# Patient Record
Sex: Female | Born: 1940
Health system: Southern US, Community
[De-identification: ages and names within clinical notes are randomized; demographics above are authoritative.]

## PROBLEM LIST (undated history)

## (undated) DIAGNOSIS — R0600 Dyspnea, unspecified: Secondary | ICD-10-CM

## (undated) DIAGNOSIS — M797 Fibromyalgia: Secondary | ICD-10-CM

## (undated) DIAGNOSIS — R011 Cardiac murmur, unspecified: Secondary | ICD-10-CM

## (undated) DIAGNOSIS — T4145XA Adverse effect of unspecified anesthetic, initial encounter: Secondary | ICD-10-CM

## (undated) DIAGNOSIS — F32A Depression, unspecified: Secondary | ICD-10-CM

## (undated) DIAGNOSIS — R42 Dizziness and giddiness: Secondary | ICD-10-CM

## (undated) DIAGNOSIS — R911 Solitary pulmonary nodule: Secondary | ICD-10-CM

## (undated) DIAGNOSIS — R042 Hemoptysis: Secondary | ICD-10-CM

## (undated) DIAGNOSIS — D649 Anemia, unspecified: Secondary | ICD-10-CM

## (undated) DIAGNOSIS — E875 Hyperkalemia: Secondary | ICD-10-CM

## (undated) DIAGNOSIS — F419 Anxiety disorder, unspecified: Secondary | ICD-10-CM

## (undated) DIAGNOSIS — T8859XA Other complications of anesthesia, initial encounter: Secondary | ICD-10-CM

## (undated) DIAGNOSIS — Z9289 Personal history of other medical treatment: Secondary | ICD-10-CM

## (undated) DIAGNOSIS — I209 Angina pectoris, unspecified: Secondary | ICD-10-CM

## (undated) DIAGNOSIS — R51 Headache: Secondary | ICD-10-CM

## (undated) DIAGNOSIS — F329 Major depressive disorder, single episode, unspecified: Secondary | ICD-10-CM

## (undated) DIAGNOSIS — E039 Hypothyroidism, unspecified: Secondary | ICD-10-CM

## (undated) DIAGNOSIS — M1990 Unspecified osteoarthritis, unspecified site: Secondary | ICD-10-CM

## (undated) DIAGNOSIS — K1230 Oral mucositis (ulcerative), unspecified: Secondary | ICD-10-CM

## (undated) DIAGNOSIS — K219 Gastro-esophageal reflux disease without esophagitis: Secondary | ICD-10-CM

## (undated) DIAGNOSIS — M25559 Pain in unspecified hip: Secondary | ICD-10-CM

## (undated) DIAGNOSIS — B450 Pulmonary cryptococcosis: Secondary | ICD-10-CM

## (undated) DIAGNOSIS — I959 Hypotension, unspecified: Secondary | ICD-10-CM

## (undated) DIAGNOSIS — E538 Deficiency of other specified B group vitamins: Secondary | ICD-10-CM

## (undated) DIAGNOSIS — C50919 Malignant neoplasm of unspecified site of unspecified female breast: Secondary | ICD-10-CM

## (undated) DIAGNOSIS — G629 Polyneuropathy, unspecified: Secondary | ICD-10-CM

## (undated) DIAGNOSIS — I219 Acute myocardial infarction, unspecified: Secondary | ICD-10-CM

## (undated) DIAGNOSIS — Z9889 Other specified postprocedural states: Secondary | ICD-10-CM

## (undated) DIAGNOSIS — E871 Hypo-osmolality and hyponatremia: Secondary | ICD-10-CM

## (undated) DIAGNOSIS — J449 Chronic obstructive pulmonary disease, unspecified: Secondary | ICD-10-CM

## (undated) DIAGNOSIS — J189 Pneumonia, unspecified organism: Secondary | ICD-10-CM

## (undated) DIAGNOSIS — R112 Nausea with vomiting, unspecified: Secondary | ICD-10-CM

## (undated) HISTORY — DX: Oral mucositis (ulcerative), unspecified: K12.30

## (undated) HISTORY — PX: EYE SURGERY: SHX253

## (undated) HISTORY — DX: Hypo-osmolality and hyponatremia: E87.1

## (undated) HISTORY — DX: Personal history of other medical treatment: Z92.89

## (undated) HISTORY — DX: Unspecified osteoarthritis, unspecified site: M19.90

## (undated) HISTORY — PX: BREAST SURGERY: SHX581

## (undated) HISTORY — DX: Chronic obstructive pulmonary disease, unspecified: J44.9

## (undated) HISTORY — DX: Solitary pulmonary nodule: R91.1

## (undated) HISTORY — DX: Hyperkalemia: E87.5

## (undated) HISTORY — DX: Dizziness and giddiness: R42

## (undated) HISTORY — PX: CATARACT EXTRACTION, BILATERAL: SHX1313

## (undated) HISTORY — DX: Hypothyroidism, unspecified: E03.9

## (undated) HISTORY — DX: Malignant neoplasm of unspecified site of unspecified female breast: C50.919

## (undated) HISTORY — PX: OTHER SURGICAL HISTORY: SHX169

## (undated) HISTORY — PX: COLONOSCOPY: SHX174

## (undated) HISTORY — PX: BREAST RECONSTRUCTION: SHX9

## (undated) HISTORY — DX: Fibromyalgia: M79.7

## (undated) HISTORY — DX: Pulmonary cryptococcosis: B45.0

## (undated) HISTORY — DX: Pain in unspecified hip: M25.559

---

## 1969-01-15 HISTORY — PX: CHOLECYSTECTOMY: SHX55

## 1979-01-16 HISTORY — PX: ABDOMINAL HYSTERECTOMY: SHX81

## 2006-01-15 DIAGNOSIS — R112 Nausea with vomiting, unspecified: Secondary | ICD-10-CM

## 2006-01-15 DIAGNOSIS — Z9889 Other specified postprocedural states: Secondary | ICD-10-CM

## 2006-01-15 HISTORY — PX: MASTECTOMY: SHX3

## 2006-01-15 HISTORY — DX: Other specified postprocedural states: R11.2

## 2006-01-15 HISTORY — DX: Other specified postprocedural states: Z98.890

## 2008-06-23 ENCOUNTER — Ambulatory Visit: Payer: Self-pay | Admitting: Internal Medicine

## 2008-07-05 LAB — CBC WITH DIFFERENTIAL/PLATELET
Basophils Absolute: 0 10*3/uL (ref 0.0–0.1)
Eosinophils Absolute: 0.1 10*3/uL (ref 0.0–0.5)
HCT: 33.5 % — ABNORMAL LOW (ref 34.8–46.6)
HGB: 11.6 g/dL (ref 11.6–15.9)
MCV: 94.7 fL (ref 79.5–101.0)
NEUT#: 3.8 10*3/uL (ref 1.5–6.5)
NEUT%: 57.2 % (ref 38.4–76.8)
RDW: 15.3 % — ABNORMAL HIGH (ref 11.2–14.5)
lymph#: 1.9 10*3/uL (ref 0.9–3.3)

## 2008-07-06 LAB — KAPPA/LAMBDA LIGHT CHAINS

## 2008-07-06 LAB — COMPREHENSIVE METABOLIC PANEL
Albumin: 3.9 g/dL (ref 3.5–5.2)
BUN: 17 mg/dL (ref 6–23)
Calcium: 9.3 mg/dL (ref 8.4–10.5)
Chloride: 103 mEq/L (ref 96–112)
Creatinine, Ser: 1.18 mg/dL (ref 0.40–1.20)
Glucose, Bld: 110 mg/dL — ABNORMAL HIGH (ref 70–99)
Potassium: 4.8 mEq/L (ref 3.5–5.3)

## 2008-07-06 LAB — LACTATE DEHYDROGENASE: LDH: 203 U/L (ref 94–250)

## 2008-07-06 LAB — IGG, IGA, IGM
IgA: 60 mg/dL — ABNORMAL LOW (ref 68–378)
IgG (Immunoglobin G), Serum: 1590 mg/dL (ref 694–1618)

## 2008-07-08 ENCOUNTER — Ambulatory Visit (HOSPITAL_COMMUNITY): Admission: RE | Admit: 2008-07-08 | Discharge: 2008-07-08 | Payer: Self-pay | Admitting: Internal Medicine

## 2008-07-13 ENCOUNTER — Ambulatory Visit (HOSPITAL_COMMUNITY): Admission: RE | Admit: 2008-07-13 | Discharge: 2008-07-13 | Payer: Self-pay | Admitting: Internal Medicine

## 2008-07-13 ENCOUNTER — Encounter (INDEPENDENT_AMBULATORY_CARE_PROVIDER_SITE_OTHER): Payer: Self-pay | Admitting: Interventional Radiology

## 2008-08-20 ENCOUNTER — Ambulatory Visit: Payer: Self-pay | Admitting: Internal Medicine

## 2008-08-23 LAB — COMPREHENSIVE METABOLIC PANEL
Albumin: 3.3 g/dL — ABNORMAL LOW (ref 3.5–5.2)
BUN: 11 mg/dL (ref 6–23)
CO2: 28 mEq/L (ref 19–32)
Calcium: 8 mg/dL — ABNORMAL LOW (ref 8.4–10.5)
Chloride: 103 mEq/L (ref 96–112)
Glucose, Bld: 110 mg/dL — ABNORMAL HIGH (ref 70–99)
Potassium: 3.7 mEq/L (ref 3.5–5.3)
Total Protein: 6.1 g/dL (ref 6.0–8.3)

## 2008-08-23 LAB — LACTATE DEHYDROGENASE: LDH: 144 U/L (ref 94–250)

## 2008-08-23 LAB — CBC WITH DIFFERENTIAL/PLATELET
Basophils Absolute: 0 10*3/uL (ref 0.0–0.1)
Eosinophils Absolute: 0.1 10*3/uL (ref 0.0–0.5)
HGB: 9.4 g/dL — ABNORMAL LOW (ref 11.6–15.9)
MCV: 93.3 fL (ref 79.5–101.0)
MONO#: 0.1 10*3/uL (ref 0.1–0.9)
MONO%: 3.1 % (ref 0.0–14.0)
NEUT#: 3.4 10*3/uL (ref 1.5–6.5)
RDW: 13.9 % (ref 11.2–14.5)
WBC: 3.9 10*3/uL (ref 3.9–10.3)
lymph#: 0.3 10*3/uL — ABNORMAL LOW (ref 0.9–3.3)

## 2008-08-27 LAB — CBC WITH DIFFERENTIAL/PLATELET
HGB: 8.8 g/dL — ABNORMAL LOW (ref 11.6–15.9)
RBC: 2.95 10*6/uL — ABNORMAL LOW (ref 3.70–5.45)
RDW: 12.8 % (ref 11.2–14.5)
WBC: <0.2 10*3/uL — CL (ref 3.9–10.3)
nRBC: 0 % (ref 0–0)

## 2008-09-06 LAB — BASIC METABOLIC PANEL
CO2: 25 mEq/L (ref 19–32)
Calcium: 8.4 mg/dL (ref 8.4–10.5)
Glucose, Bld: 129 mg/dL — ABNORMAL HIGH (ref 70–99)
Potassium: 3.5 mEq/L (ref 3.5–5.3)
Sodium: 140 mEq/L (ref 135–145)

## 2008-09-06 LAB — CBC WITH DIFFERENTIAL/PLATELET
Basophils Absolute: 0 10*3/uL (ref 0.0–0.1)
Eosinophils Absolute: 0 10*3/uL (ref 0.0–0.5)
HCT: 28.5 % — ABNORMAL LOW (ref 34.8–46.6)
HGB: 9.2 g/dL — ABNORMAL LOW (ref 11.6–15.9)
MCV: 91.3 fL (ref 79.5–101.0)
MONO%: 19.2 % — ABNORMAL HIGH (ref 0.0–14.0)
NEUT#: 5.7 10*3/uL (ref 1.5–6.5)
RDW: 13.6 % (ref 11.2–14.5)

## 2008-09-13 LAB — CBC WITH DIFFERENTIAL/PLATELET
Basophils Absolute: 0 10*3/uL (ref 0.0–0.1)
EOS%: 0.1 % (ref 0.0–7.0)
HCT: 28.4 % — ABNORMAL LOW (ref 34.8–46.6)
HGB: 9.5 g/dL — ABNORMAL LOW (ref 11.6–15.9)
LYMPH%: 21.4 % (ref 14.0–49.7)
MCH: 31.3 pg (ref 25.1–34.0)
MCV: 93.1 fL (ref 79.5–101.0)
MONO%: 13.6 % (ref 0.0–14.0)
NEUT%: 64.3 % (ref 38.4–76.8)

## 2008-09-13 LAB — BASIC METABOLIC PANEL
BUN: 7 mg/dL (ref 6–23)
Creatinine, Ser: 1.19 mg/dL (ref 0.40–1.20)

## 2008-09-13 LAB — MAGNESIUM: Magnesium: 2.3 mg/dL (ref 1.5–2.5)

## 2008-10-19 ENCOUNTER — Ambulatory Visit: Payer: Self-pay | Admitting: Internal Medicine

## 2008-10-19 LAB — MANUAL DIFFERENTIAL
ALC: 1.4 10*3/uL (ref 0.9–3.3)
Band Neutrophils: 1 % (ref 0–10)
LYMPH: 29 % (ref 14–49)
MONO: 12 % (ref 0–14)
Myelocytes: 0 % (ref 0–0)
Other Cell: 0 % (ref 0–0)
SEG: 54 % (ref 38–77)
Variant Lymph: 0 % (ref 0–0)
nRBC: 1 % — ABNORMAL HIGH (ref 0–0)

## 2008-10-19 LAB — CBC WITH DIFFERENTIAL/PLATELET
HCT: 32 % — ABNORMAL LOW (ref 34.8–46.6)
HGB: 10.6 g/dL — ABNORMAL LOW (ref 11.6–15.9)
Platelets: 105 10*3/uL — ABNORMAL LOW (ref 145–400)
RBC: 3.56 10*6/uL — ABNORMAL LOW (ref 3.70–5.45)
WBC: 4.9 10*3/uL (ref 3.9–10.3)

## 2008-10-19 LAB — BASIC METABOLIC PANEL
Chloride: 101 mEq/L (ref 96–112)
Creatinine, Ser: 0.75 mg/dL (ref 0.40–1.20)

## 2008-10-21 ENCOUNTER — Inpatient Hospital Stay (HOSPITAL_COMMUNITY): Admission: EM | Admit: 2008-10-21 | Discharge: 2008-10-25 | Payer: Self-pay | Admitting: Emergency Medicine

## 2008-11-16 ENCOUNTER — Encounter: Admission: RE | Admit: 2008-11-16 | Discharge: 2008-11-16 | Payer: Self-pay | Admitting: Otolaryngology

## 2008-12-14 ENCOUNTER — Ambulatory Visit: Payer: Self-pay | Admitting: Internal Medicine

## 2008-12-14 LAB — COMPREHENSIVE METABOLIC PANEL
BUN: 6 mg/dL (ref 6–23)
CO2: 25 mEq/L (ref 19–32)
Calcium: 9.2 mg/dL (ref 8.4–10.5)
Chloride: 101 mEq/L (ref 96–112)
Creatinine, Ser: 0.62 mg/dL (ref 0.40–1.20)
Glucose, Bld: 113 mg/dL — ABNORMAL HIGH (ref 70–99)
Total Bilirubin: 1.3 mg/dL — ABNORMAL HIGH (ref 0.3–1.2)

## 2008-12-14 LAB — CBC WITH DIFFERENTIAL/PLATELET
Basophils Absolute: 0 10*3/uL (ref 0.0–0.1)
HCT: 35.8 % (ref 34.8–46.6)
HGB: 12 g/dL (ref 11.6–15.9)
LYMPH%: 56.4 % — ABNORMAL HIGH (ref 14.0–49.7)
MONO#: 0.6 10*3/uL (ref 0.1–0.9)
NEUT%: 33.8 % — ABNORMAL LOW (ref 38.4–76.8)
Platelets: 149 10*3/uL (ref 145–400)
WBC: 7.2 10*3/uL (ref 3.9–10.3)
lymph#: 4 10*3/uL — ABNORMAL HIGH (ref 0.9–3.3)

## 2008-12-15 ENCOUNTER — Emergency Department (HOSPITAL_COMMUNITY): Admission: EM | Admit: 2008-12-15 | Discharge: 2008-12-15 | Payer: Self-pay | Admitting: Emergency Medicine

## 2008-12-15 LAB — BASIC METABOLIC PANEL
BUN: 6 mg/dL (ref 6–23)
Calcium: 9.1 mg/dL (ref 8.4–10.5)
Potassium: 2.8 mEq/L — ABNORMAL LOW (ref 3.5–5.3)
Sodium: 141 mEq/L (ref 135–145)

## 2008-12-15 LAB — MAGNESIUM: Magnesium: 1.7 mg/dL (ref 1.5–2.5)

## 2008-12-17 LAB — BASIC METABOLIC PANEL
CO2: 21 mEq/L (ref 19–32)
Calcium: 9 mg/dL (ref 8.4–10.5)
Sodium: 141 mEq/L (ref 135–145)

## 2008-12-21 ENCOUNTER — Ambulatory Visit (HOSPITAL_COMMUNITY): Admission: RE | Admit: 2008-12-21 | Discharge: 2008-12-21 | Payer: Self-pay | Admitting: Oncology

## 2008-12-24 LAB — COMPREHENSIVE METABOLIC PANEL
Albumin: 3.5 g/dL (ref 3.5–5.2)
BUN: 3 mg/dL — ABNORMAL LOW (ref 6–23)
CO2: 21 mEq/L (ref 19–32)
Calcium: 9.9 mg/dL (ref 8.4–10.5)
Chloride: 101 mEq/L (ref 96–112)
Creatinine, Ser: 0.9 mg/dL (ref 0.40–1.20)
Glucose, Bld: 102 mg/dL — ABNORMAL HIGH (ref 70–99)
Potassium: 3.7 mEq/L (ref 3.5–5.3)

## 2008-12-24 LAB — CBC WITH DIFFERENTIAL/PLATELET
Basophils Absolute: 0 10*3/uL (ref 0.0–0.1)
EOS%: 1.1 % (ref 0.0–7.0)
Eosinophils Absolute: 0.1 10*3/uL (ref 0.0–0.5)
HCT: 38.9 % (ref 34.8–46.6)
HGB: 13.2 g/dL (ref 11.6–15.9)
MCH: 33.5 pg (ref 25.1–34.0)
MONO#: 0.5 10*3/uL (ref 0.1–0.9)
NEUT#: 2.8 10*3/uL (ref 1.5–6.5)
NEUT%: 38 % — ABNORMAL LOW (ref 38.4–76.8)
lymph#: 4 10*3/uL — ABNORMAL HIGH (ref 0.9–3.3)

## 2008-12-24 LAB — T3 UPTAKE: T3 Uptake: 33.7 % (ref 22.5–37.0)

## 2008-12-24 LAB — TSH: TSH: 0.841 u[IU]/mL (ref 0.350–4.500)

## 2008-12-27 LAB — BASIC METABOLIC PANEL
BUN: 4 mg/dL — ABNORMAL LOW (ref 6–23)
Calcium: 9.4 mg/dL (ref 8.4–10.5)
Creatinine, Ser: 0.92 mg/dL (ref 0.40–1.20)

## 2008-12-31 LAB — BASIC METABOLIC PANEL
Potassium: 3.8 mEq/L (ref 3.5–5.3)
Sodium: 139 mEq/L (ref 135–145)

## 2008-12-31 LAB — MAGNESIUM: Magnesium: 1.5 mg/dL (ref 1.5–2.5)

## 2009-01-11 LAB — BASIC METABOLIC PANEL
CO2: 22 mEq/L (ref 19–32)
Calcium: 8.7 mg/dL (ref 8.4–10.5)
Glucose, Bld: 109 mg/dL — ABNORMAL HIGH (ref 70–99)
Potassium: 4.8 mEq/L (ref 3.5–5.3)
Sodium: 143 mEq/L (ref 135–145)

## 2009-01-12 ENCOUNTER — Ambulatory Visit: Payer: Self-pay | Admitting: Internal Medicine

## 2009-01-17 LAB — CBC WITH DIFFERENTIAL/PLATELET
BASO%: 0.5 % (ref 0.0–2.0)
Basophils Absolute: 0 10*3/uL (ref 0.0–0.1)
EOS%: 3.2 % (ref 0.0–7.0)
Eosinophils Absolute: 0.2 10*3/uL (ref 0.0–0.5)
HCT: 32.6 % — ABNORMAL LOW (ref 34.8–46.6)
HGB: 11.1 g/dL — ABNORMAL LOW (ref 11.6–15.9)
LYMPH%: 47.2 % (ref 14.0–49.7)
MCH: 33.8 pg (ref 25.1–34.0)
MCHC: 33.9 g/dL (ref 31.5–36.0)
MCV: 99.6 fL (ref 79.5–101.0)
MONO#: 0.3 10*3/uL (ref 0.1–0.9)
MONO%: 6.3 % (ref 0.0–14.0)
NEUT#: 2.3 10*3/uL (ref 1.5–6.5)
NEUT%: 42.8 % (ref 38.4–76.8)
Platelets: 212 10*3/uL (ref 145–400)
RBC: 3.28 10*6/uL — ABNORMAL LOW (ref 3.70–5.45)
RDW: 15 % — ABNORMAL HIGH (ref 11.2–14.5)
WBC: 5.4 10*3/uL (ref 3.9–10.3)
lymph#: 2.5 10*3/uL (ref 0.9–3.3)

## 2009-01-17 LAB — COMPREHENSIVE METABOLIC PANEL
ALT: 20 U/L (ref 0–35)
AST: 45 U/L — ABNORMAL HIGH (ref 0–37)
Albumin: 3.5 g/dL (ref 3.5–5.2)
Alkaline Phosphatase: 139 U/L — ABNORMAL HIGH (ref 39–117)
BUN: 14 mg/dL (ref 6–23)
CO2: 25 mEq/L (ref 19–32)
Calcium: 8.4 mg/dL (ref 8.4–10.5)
Chloride: 108 mEq/L (ref 96–112)
Creatinine, Ser: 0.75 mg/dL (ref 0.40–1.20)
Glucose, Bld: 103 mg/dL — ABNORMAL HIGH (ref 70–99)
Potassium: 3.8 mEq/L (ref 3.5–5.3)
Sodium: 143 mEq/L (ref 135–145)
Total Bilirubin: 0.5 mg/dL (ref 0.3–1.2)
Total Protein: 5.4 g/dL — ABNORMAL LOW (ref 6.0–8.3)

## 2009-01-18 LAB — KAPPA/LAMBDA LIGHT CHAINS
Kappa free light chain: 1.3 mg/dL (ref 0.33–1.94)
Kappa:Lambda Ratio: 1.97 — ABNORMAL HIGH (ref 0.26–1.65)
Lambda Free Lght Chn: 0.66 mg/dL (ref 0.57–2.63)

## 2009-01-18 LAB — BETA 2 MICROGLOBULIN, SERUM: Beta-2 Microglobulin: 2.77 mg/L — ABNORMAL HIGH (ref 1.01–1.73)

## 2009-01-18 LAB — IGG, IGA, IGM
IgA: 92 mg/dL (ref 68–378)
IgG (Immunoglobin G), Serum: 882 mg/dL (ref 694–1618)
IgM, Serum: 39 mg/dL — ABNORMAL LOW (ref 60–263)

## 2009-01-26 ENCOUNTER — Ambulatory Visit: Payer: Self-pay | Admitting: Vascular Surgery

## 2009-01-26 ENCOUNTER — Ambulatory Visit
Admission: RE | Admit: 2009-01-26 | Discharge: 2009-01-26 | Payer: Self-pay | Source: Home / Self Care | Admitting: Internal Medicine

## 2009-01-26 ENCOUNTER — Encounter: Payer: Self-pay | Admitting: Internal Medicine

## 2009-01-26 LAB — COMPREHENSIVE METABOLIC PANEL
ALT: 12 U/L (ref 0–35)
AST: 25 U/L (ref 0–37)
Albumin: 3.3 g/dL — ABNORMAL LOW (ref 3.5–5.2)
Alkaline Phosphatase: 111 U/L (ref 39–117)
BUN: 8 mg/dL (ref 6–23)
CO2: 27 mEq/L (ref 19–32)
Calcium: 8.5 mg/dL (ref 8.4–10.5)
Chloride: 106 mEq/L (ref 96–112)
Creatinine, Ser: 0.82 mg/dL (ref 0.40–1.20)
Glucose, Bld: 105 mg/dL — ABNORMAL HIGH (ref 70–99)
Potassium: 3.1 mEq/L — ABNORMAL LOW (ref 3.5–5.3)
Sodium: 140 mEq/L (ref 135–145)
Total Bilirubin: 0.8 mg/dL (ref 0.3–1.2)
Total Protein: 5.7 g/dL — ABNORMAL LOW (ref 6.0–8.3)

## 2009-01-26 LAB — CBC WITH DIFFERENTIAL/PLATELET
BASO%: 0.4 % (ref 0.0–2.0)
Basophils Absolute: 0 10*3/uL (ref 0.0–0.1)
EOS%: 2.8 % (ref 0.0–7.0)
Eosinophils Absolute: 0.2 10*3/uL (ref 0.0–0.5)
HCT: 32 % — ABNORMAL LOW (ref 34.8–46.6)
HGB: 10.6 g/dL — ABNORMAL LOW (ref 11.6–15.9)
LYMPH%: 46.9 % (ref 14.0–49.7)
MCH: 33.3 pg (ref 25.1–34.0)
MCHC: 33.3 g/dL (ref 31.5–36.0)
MCV: 100.1 fL (ref 79.5–101.0)
MONO#: 0.4 10*3/uL (ref 0.1–0.9)
MONO%: 6.8 % (ref 0.0–14.0)
NEUT#: 2.6 10*3/uL (ref 1.5–6.5)
NEUT%: 43.1 % (ref 38.4–76.8)
Platelets: 202 10*3/uL (ref 145–400)
RBC: 3.2 10*6/uL — ABNORMAL LOW (ref 3.70–5.45)
RDW: 15.8 % — ABNORMAL HIGH (ref 11.2–14.5)
WBC: 5.9 10*3/uL (ref 3.9–10.3)
lymph#: 2.8 10*3/uL (ref 0.9–3.3)

## 2009-02-14 ENCOUNTER — Ambulatory Visit: Payer: Self-pay | Admitting: Internal Medicine

## 2009-04-22 ENCOUNTER — Ambulatory Visit: Payer: Self-pay | Admitting: Internal Medicine

## 2009-05-04 ENCOUNTER — Ambulatory Visit (HOSPITAL_COMMUNITY): Admission: RE | Admit: 2009-05-04 | Discharge: 2009-05-04 | Payer: Self-pay | Admitting: General Surgery

## 2009-08-16 ENCOUNTER — Ambulatory Visit (HOSPITAL_BASED_OUTPATIENT_CLINIC_OR_DEPARTMENT_OTHER): Payer: Medicare Other | Admitting: Internal Medicine

## 2009-08-16 LAB — CBC WITH DIFFERENTIAL/PLATELET
BASO%: 0.4 % (ref 0.0–2.0)
Basophils Absolute: 0 10*3/uL (ref 0.0–0.1)
EOS%: 2 % (ref 0.0–7.0)
Eosinophils Absolute: 0.1 10*3/uL (ref 0.0–0.5)
HCT: 36.6 % (ref 34.8–46.6)
HGB: 12.6 g/dL (ref 11.6–15.9)
LYMPH%: 44.5 % (ref 14.0–49.7)
MCH: 33.1 pg (ref 25.1–34.0)
MCHC: 34.3 g/dL (ref 31.5–36.0)
MCV: 96.5 fL (ref 79.5–101.0)
MONO#: 0.6 10*3/uL (ref 0.1–0.9)
MONO%: 8.8 % (ref 0.0–14.0)
NEUT#: 2.8 10*3/uL (ref 1.5–6.5)
NEUT%: 44.3 % (ref 38.4–76.8)
Platelets: 289 10*3/uL (ref 145–400)
RBC: 3.79 10*6/uL (ref 3.70–5.45)
RDW: 13.7 % (ref 11.2–14.5)
WBC: 6.4 10*3/uL (ref 3.9–10.3)
lymph#: 2.9 10*3/uL (ref 0.9–3.3)

## 2009-08-17 LAB — COMPREHENSIVE METABOLIC PANEL
ALT: 19 U/L (ref 0–35)
AST: 19 U/L (ref 0–37)
Albumin: 4.4 g/dL (ref 3.5–5.2)
Alkaline Phosphatase: 140 U/L — ABNORMAL HIGH (ref 39–117)
BUN: 22 mg/dL (ref 6–23)
CO2: 26 mEq/L (ref 19–32)
Calcium: 9.7 mg/dL (ref 8.4–10.5)
Chloride: 103 mEq/L (ref 96–112)
Creatinine, Ser: 1.3 mg/dL — ABNORMAL HIGH (ref 0.40–1.20)
Glucose, Bld: 102 mg/dL — ABNORMAL HIGH (ref 70–99)
Potassium: 4.4 mEq/L (ref 3.5–5.3)
Sodium: 140 mEq/L (ref 135–145)
Total Bilirubin: 0.4 mg/dL (ref 0.3–1.2)
Total Protein: 6.3 g/dL (ref 6.0–8.3)

## 2009-08-17 LAB — IGG, IGA, IGM
IgA: 46 mg/dL — ABNORMAL LOW (ref 68–378)
IgG (Immunoglobin G), Serum: 769 mg/dL (ref 694–1618)
IgM, Serum: 31 mg/dL — ABNORMAL LOW (ref 60–263)

## 2009-08-17 LAB — KAPPA/LAMBDA LIGHT CHAINS
Kappa free light chain: 0.79 mg/dL (ref 0.33–1.94)
Kappa:Lambda Ratio: 1.61 (ref 0.26–1.65)
Lambda Free Lght Chn: 0.49 mg/dL — ABNORMAL LOW (ref 0.57–2.63)

## 2009-08-17 LAB — BETA 2 MICROGLOBULIN, SERUM: Beta-2 Microglobulin: 3.26 mg/L — ABNORMAL HIGH (ref 1.01–1.73)

## 2009-08-17 LAB — LACTATE DEHYDROGENASE: LDH: 128 U/L (ref 94–250)

## 2009-08-22 ENCOUNTER — Ambulatory Visit (HOSPITAL_COMMUNITY): Admission: RE | Admit: 2009-08-22 | Discharge: 2009-08-22 | Payer: Self-pay | Admitting: Internal Medicine

## 2009-08-23 ENCOUNTER — Encounter: Admission: RE | Admit: 2009-08-23 | Discharge: 2009-08-23 | Payer: Self-pay | Admitting: Orthopedic Surgery

## 2009-10-16 ENCOUNTER — Emergency Department (HOSPITAL_COMMUNITY): Admission: EM | Admit: 2009-10-16 | Discharge: 2009-10-17 | Payer: Self-pay | Admitting: Emergency Medicine

## 2009-10-29 ENCOUNTER — Encounter: Admission: RE | Admit: 2009-10-29 | Discharge: 2009-10-29 | Payer: Self-pay | Admitting: Orthopedic Surgery

## 2009-11-15 ENCOUNTER — Encounter
Admission: RE | Admit: 2009-11-15 | Discharge: 2010-01-04 | Payer: Self-pay | Source: Home / Self Care | Attending: Orthopedic Surgery | Admitting: Orthopedic Surgery

## 2009-11-25 ENCOUNTER — Encounter: Admission: RE | Admit: 2009-11-25 | Discharge: 2009-11-25 | Payer: Self-pay | Admitting: Orthopedic Surgery

## 2010-02-16 ENCOUNTER — Ambulatory Visit: Payer: No Typology Code available for payment source | Admitting: Internal Medicine

## 2010-02-16 DIAGNOSIS — C9000 Multiple myeloma not having achieved remission: Secondary | ICD-10-CM

## 2010-02-16 LAB — COMPREHENSIVE METABOLIC PANEL
ALT: 18 U/L (ref 0–35)
AST: 15 U/L (ref 0–37)
Albumin: 3.6 g/dL (ref 3.5–5.2)
Alkaline Phosphatase: 121 U/L — ABNORMAL HIGH (ref 39–117)
BUN: 21 mg/dL (ref 6–23)
CO2: 32 mEq/L (ref 19–32)
Calcium: 9.6 mg/dL (ref 8.4–10.5)
Chloride: 102 mEq/L (ref 96–112)
Creatinine, Ser: 1.12 mg/dL (ref 0.40–1.20)
Glucose, Bld: 95 mg/dL (ref 70–99)
Potassium: 3.8 mEq/L (ref 3.5–5.3)
Sodium: 141 mEq/L (ref 135–145)
Total Bilirubin: 0.5 mg/dL (ref 0.3–1.2)
Total Protein: 6.7 g/dL (ref 6.0–8.3)

## 2010-02-16 LAB — CBC WITH DIFFERENTIAL/PLATELET
BASO%: 0.2 % (ref 0.0–2.0)
Basophils Absolute: 0 10*3/uL (ref 0.0–0.1)
EOS%: 0.4 % (ref 0.0–7.0)
Eosinophils Absolute: 0 10*3/uL (ref 0.0–0.5)
HCT: 35.2 % (ref 34.8–46.6)
HGB: 11.9 g/dL (ref 11.6–15.9)
LYMPH%: 26.1 % (ref 14.0–49.7)
MCH: 31.4 pg (ref 25.1–34.0)
MCHC: 33.9 g/dL (ref 31.5–36.0)
MCV: 92.7 fL (ref 79.5–101.0)
MONO#: 0.6 10*3/uL (ref 0.1–0.9)
MONO%: 7.3 % (ref 0.0–14.0)
NEUT#: 5.8 10*3/uL (ref 1.5–6.5)
NEUT%: 66 % (ref 38.4–76.8)
Platelets: 331 10*3/uL (ref 145–400)
RBC: 3.8 10*6/uL (ref 3.70–5.45)
RDW: 15.2 % — ABNORMAL HIGH (ref 11.2–14.5)
WBC: 8.8 10*3/uL (ref 3.9–10.3)
lymph#: 2.3 10*3/uL (ref 0.9–3.3)

## 2010-02-16 LAB — LACTATE DEHYDROGENASE: LDH: 102 U/L (ref 94–250)

## 2010-02-17 LAB — KAPPA/LAMBDA LIGHT CHAINS
Kappa free light chain: 1.49 mg/dL (ref 0.33–1.94)
Kappa:Lambda Ratio: 1.75 — ABNORMAL HIGH (ref 0.26–1.65)
Lambda Free Lght Chn: 0.85 mg/dL (ref 0.57–2.63)

## 2010-02-17 LAB — IGG, IGA, IGM
IgA: 69 mg/dL (ref 68–378)
IgG (Immunoglobin G), Serum: 960 mg/dL (ref 694–1618)
IgM, Serum: 50 mg/dL — ABNORMAL LOW (ref 60–263)

## 2010-02-17 LAB — BETA 2 MICROGLOBULIN, SERUM: Beta-2 Microglobulin: 2.73 mg/L — ABNORMAL HIGH (ref 1.01–1.73)

## 2010-02-20 ENCOUNTER — Other Ambulatory Visit: Payer: Self-pay | Admitting: Internal Medicine

## 2010-02-22 ENCOUNTER — Encounter (HOSPITAL_BASED_OUTPATIENT_CLINIC_OR_DEPARTMENT_OTHER): Payer: Medicare Other | Admitting: Internal Medicine

## 2010-02-22 DIAGNOSIS — Z853 Personal history of malignant neoplasm of breast: Secondary | ICD-10-CM

## 2010-02-22 DIAGNOSIS — C9000 Multiple myeloma not having achieved remission: Secondary | ICD-10-CM

## 2010-02-22 DIAGNOSIS — Z9484 Stem cells transplant status: Secondary | ICD-10-CM

## 2010-02-22 LAB — UIFE/LIGHT CHAINS/TP QN, 24-HR UR
Alpha 2, Urine: DETECTED — AB
Beta, Urine: DETECTED — AB
Free Kappa Lt Chains,Ur: 3.7 mg/dL — ABNORMAL HIGH (ref 0.04–1.51)
Free Lambda Excretion/Day: 2.4 mg/d
Free Lambda Lt Chains,Ur: 0.16 mg/dL (ref 0.08–1.01)
Free Lt Chn Excr Rate: 55.5 mg/d
Total Protein, Urine-Ur/day: 66 mg/d (ref 10–140)
Total Protein, Urine: 4.4 mg/dL
Volume, Urine: 1500 mL

## 2010-03-07 ENCOUNTER — Other Ambulatory Visit: Payer: Self-pay | Admitting: Orthopedic Surgery

## 2010-03-07 DIAGNOSIS — M545 Low back pain: Secondary | ICD-10-CM

## 2010-03-10 ENCOUNTER — Ambulatory Visit
Admission: RE | Admit: 2010-03-10 | Discharge: 2010-03-10 | Disposition: A | Discharge status: Home / Self Care | Payer: Medicare Other | Source: Ambulatory Visit | Attending: Orthopedic Surgery | Admitting: Orthopedic Surgery

## 2010-03-10 DIAGNOSIS — M545 Low back pain: Secondary | ICD-10-CM

## 2010-03-22 ENCOUNTER — Encounter: Payer: Self-pay | Admitting: Cardiovascular Disease

## 2010-03-30 LAB — CBC
HCT: 39.2 % (ref 36.0–46.0)
Hemoglobin: 13.6 g/dL (ref 12.0–15.0)
MCH: 33 pg (ref 26.0–34.0)
MCHC: 34.8 g/dL (ref 30.0–36.0)
MCV: 95 fL (ref 78.0–100.0)
Platelets: 258 10*3/uL (ref 150–400)
RBC: 4.13 MIL/uL (ref 3.87–5.11)
RDW: 13.6 % (ref 11.5–15.5)
WBC: 8.8 10*3/uL (ref 4.0–10.5)

## 2010-03-30 LAB — COMPREHENSIVE METABOLIC PANEL
ALT: 18 U/L (ref 0–35)
AST: 18 U/L (ref 0–37)
Albumin: 4 g/dL (ref 3.5–5.2)
Alkaline Phosphatase: 119 U/L — ABNORMAL HIGH (ref 39–117)
Calcium: 9.2 mg/dL (ref 8.4–10.5)
GFR calc Af Amer: 56 mL/min — ABNORMAL LOW (ref >60)
Glucose, Bld: 110 mg/dL — ABNORMAL HIGH (ref 70–99)
Potassium: 3.4 mEq/L — ABNORMAL LOW (ref 3.5–5.1)
Sodium: 136 mEq/L (ref 135–145)
Total Protein: 6.5 g/dL (ref 6.0–8.3)

## 2010-03-30 LAB — DIFFERENTIAL
Basophils Absolute: 0 10*3/uL (ref 0.0–0.1)
Basophils Relative: 0 % (ref 0–1)
Eosinophils Absolute: 0.1 10*3/uL (ref 0.0–0.7)
Eosinophils Relative: 1 % (ref 0–5)
Lymphocytes Relative: 27 % (ref 12–46)
Lymphs Abs: 2.3 10*3/uL (ref 0.7–4.0)
Monocytes Absolute: 0.3 10*3/uL (ref 0.1–1.0)
Monocytes Relative: 4 % (ref 3–12)
Neutro Abs: 6 10*3/uL (ref 1.7–7.7)
Neutrophils Relative %: 69 % (ref 43–77)

## 2010-03-30 LAB — POCT CARDIAC MARKERS
CKMB, poc: <1 ng/mL — ABNORMAL LOW (ref 1.0–8.0)
Troponin i, poc: <0.05 ng/mL (ref 0.00–0.09)

## 2010-03-30 LAB — CLOSTRIDIUM DIFFICILE EIA: C difficile Toxins A+B, EIA: NEGATIVE

## 2010-04-04 LAB — CBC
HCT: 35.2 % — ABNORMAL LOW (ref 36.0–46.0)
MCHC: 34.9 g/dL (ref 30.0–36.0)
MCV: 97.5 fL (ref 78.0–100.0)
Platelets: 228 10*3/uL (ref 150–400)

## 2010-04-18 LAB — COMPREHENSIVE METABOLIC PANEL
ALT: 17 U/L (ref 0–35)
AST: 33 U/L (ref 0–37)
CO2: 26 mEq/L (ref 19–32)
Calcium: 9.2 mg/dL (ref 8.4–10.5)
Chloride: 101 mEq/L (ref 96–112)
GFR calc Af Amer: >60 mL/min (ref >60)
GFR calc non Af Amer: >60 mL/min (ref >60)
Glucose, Bld: 98 mg/dL (ref 70–99)
Sodium: 139 mEq/L (ref 135–145)
Total Bilirubin: 1.9 mg/dL — ABNORMAL HIGH (ref 0.3–1.2)

## 2010-04-18 LAB — URINALYSIS, ROUTINE W REFLEX MICROSCOPIC
Ketones, ur: >80 mg/dL — AB
Nitrite: NEGATIVE
Protein, ur: 30 mg/dL — AB

## 2010-04-18 LAB — URINE MICROSCOPIC-ADD ON

## 2010-04-18 LAB — DIFFERENTIAL
Basophils Absolute: 0.1 10*3/uL (ref 0.0–0.1)
Basophils Relative: 2 % — ABNORMAL HIGH (ref 0–1)
Eosinophils Absolute: 0 10*3/uL (ref 0.0–0.7)
Eosinophils Relative: 1 % (ref 0–5)
Neutrophils Relative %: 49 % (ref 43–77)

## 2010-04-18 LAB — CBC
Hemoglobin: 12.1 g/dL (ref 12.0–15.0)
MCHC: 33.2 g/dL (ref 30.0–36.0)
MCV: 98.9 fL (ref 78.0–100.0)
RBC: 3.68 MIL/uL — ABNORMAL LOW (ref 3.87–5.11)
WBC: 7.3 10*3/uL (ref 4.0–10.5)

## 2010-04-18 LAB — URINE CULTURE

## 2010-04-20 LAB — COMPREHENSIVE METABOLIC PANEL
Alkaline Phosphatase: 90 U/L (ref 39–117)
BUN: 6 mg/dL (ref 6–23)
CO2: 25 mEq/L (ref 19–32)
Chloride: 108 mEq/L (ref 96–112)
GFR calc non Af Amer: 57 mL/min — ABNORMAL LOW (ref >60)
Glucose, Bld: 91 mg/dL (ref 70–99)
Potassium: 3.8 mEq/L (ref 3.5–5.1)
Total Bilirubin: 0.6 mg/dL (ref 0.3–1.2)

## 2010-04-20 LAB — CBC
HCT: 28.3 % — ABNORMAL LOW (ref 36.0–46.0)
HCT: 30.8 % — ABNORMAL LOW (ref 36.0–46.0)
HCT: 32 % — ABNORMAL LOW (ref 36.0–46.0)
Hemoglobin: 10.4 g/dL — ABNORMAL LOW (ref 12.0–15.0)
Hemoglobin: 10.8 g/dL — ABNORMAL LOW (ref 12.0–15.0)
Hemoglobin: 9.2 g/dL — ABNORMAL LOW (ref 12.0–15.0)
Hemoglobin: 9.4 g/dL — ABNORMAL LOW (ref 12.0–15.0)
Hemoglobin: 9.7 g/dL — ABNORMAL LOW (ref 12.0–15.0)
MCHC: 33.9 g/dL (ref 30.0–36.0)
MCV: 92.9 fL (ref 78.0–100.0)
RBC: 2.98 MIL/uL — ABNORMAL LOW (ref 3.87–5.11)
RBC: 3 MIL/uL — ABNORMAL LOW (ref 3.87–5.11)
RBC: 3.26 MIL/uL — ABNORMAL LOW (ref 3.87–5.11)
RDW: 17.3 % — ABNORMAL HIGH (ref 11.5–15.5)
RDW: 17.9 % — ABNORMAL HIGH (ref 11.5–15.5)
RDW: 18.9 % — ABNORMAL HIGH (ref 11.5–15.5)
WBC: 6.4 10*3/uL (ref 4.0–10.5)
WBC: 6.8 10*3/uL (ref 4.0–10.5)

## 2010-04-20 LAB — BASIC METABOLIC PANEL
BUN: 10 mg/dL (ref 6–23)
CO2: 21 mEq/L (ref 19–32)
CO2: 23 mEq/L (ref 19–32)
CO2: 25 mEq/L (ref 19–32)
Calcium: 7.9 mg/dL — ABNORMAL LOW (ref 8.4–10.5)
Calcium: 8 mg/dL — ABNORMAL LOW (ref 8.4–10.5)
Chloride: 99 mEq/L (ref 96–112)
Creatinine, Ser: 0.83 mg/dL (ref 0.4–1.2)
GFR calc Af Amer: >60 mL/min (ref >60)
GFR calc Af Amer: >60 mL/min (ref >60)
GFR calc Af Amer: >60 mL/min (ref >60)
GFR calc non Af Amer: 57 mL/min — ABNORMAL LOW (ref >60)
GFR calc non Af Amer: >60 mL/min (ref >60)
GFR calc non Af Amer: >60 mL/min (ref >60)
GFR calc non Af Amer: >60 mL/min (ref >60)
Glucose, Bld: 94 mg/dL (ref 70–99)
Glucose, Bld: 95 mg/dL (ref 70–99)
Potassium: 2.8 mEq/L — ABNORMAL LOW (ref 3.5–5.1)
Potassium: 3.1 mEq/L — ABNORMAL LOW (ref 3.5–5.1)
Sodium: 132 mEq/L — ABNORMAL LOW (ref 135–145)
Sodium: 136 mEq/L (ref 135–145)
Sodium: 136 mEq/L (ref 135–145)
Sodium: 139 mEq/L (ref 135–145)

## 2010-04-20 LAB — DIFFERENTIAL
Basophils Absolute: 0 10*3/uL (ref 0.0–0.1)
Basophils Relative: 0 % (ref 0–1)
Basophils Relative: 1 % (ref 0–1)
Eosinophils Absolute: 0 10*3/uL (ref 0.0–0.7)
Lymphocytes Relative: 59 % — ABNORMAL HIGH (ref 12–46)
Lymphs Abs: 4.1 10*3/uL — ABNORMAL HIGH (ref 0.7–4.0)
Monocytes Absolute: 0.9 10*3/uL (ref 0.1–1.0)
Neutro Abs: 1.8 10*3/uL (ref 1.7–7.7)
Neutro Abs: 1.8 10*3/uL (ref 1.7–7.7)
Neutrophils Relative %: 28 % — ABNORMAL LOW (ref 43–77)

## 2010-04-20 LAB — URINALYSIS, ROUTINE W REFLEX MICROSCOPIC
Bilirubin Urine: NEGATIVE
Glucose, UA: NEGATIVE mg/dL
Leukocytes, UA: NEGATIVE
Nitrite: NEGATIVE
Specific Gravity, Urine: 1.019 (ref 1.005–1.030)
pH: 5.5 (ref 5.0–8.0)

## 2010-04-20 LAB — URINE MICROSCOPIC-ADD ON

## 2010-04-20 LAB — CLOSTRIDIUM DIFFICILE EIA
C difficile Toxins A+B, EIA: NEGATIVE
C difficile Toxins A+B, EIA: NEGATIVE

## 2010-04-20 LAB — PROTIME-INR: INR: 1.17 (ref 0.00–1.49)

## 2010-04-24 LAB — PROTIME-INR
INR: 0.9 (ref 0.00–1.49)
Prothrombin Time: 12.4 seconds (ref 11.6–15.2)

## 2010-04-24 LAB — CBC
HCT: 36.1 % (ref 36.0–46.0)
Platelets: 340 10*3/uL (ref 150–400)
RDW: 14.5 % (ref 11.5–15.5)
WBC: 7.8 10*3/uL (ref 4.0–10.5)

## 2010-04-24 LAB — BONE MARROW EXAM

## 2010-04-24 LAB — APTT: aPTT: 26 seconds (ref 24–37)

## 2010-05-24 ENCOUNTER — Encounter: Payer: Self-pay | Admitting: *Deleted

## 2010-05-24 ENCOUNTER — Encounter: Payer: Self-pay | Admitting: Cardiovascular Disease

## 2010-05-25 ENCOUNTER — Encounter: Payer: Self-pay | Admitting: Cardiovascular Disease

## 2010-05-25 ENCOUNTER — Encounter: Payer: Self-pay | Admitting: *Deleted

## 2010-05-25 ENCOUNTER — Ambulatory Visit (INDEPENDENT_AMBULATORY_CARE_PROVIDER_SITE_OTHER): Payer: Medicare Other | Admitting: Cardiovascular Disease

## 2010-05-25 VITALS — BP 110/72 | HR 83 | Ht 65.5 in | Wt 180.4 lb

## 2010-05-25 DIAGNOSIS — R9431 Abnormal electrocardiogram [ECG] [EKG]: Secondary | ICD-10-CM

## 2010-05-25 DIAGNOSIS — Z01818 Encounter for other preprocedural examination: Secondary | ICD-10-CM

## 2010-05-25 MED ORDER — CEFACLOR 375 MG PO CHEW: 375.0000 mg | CHEWABLE_TABLET | Freq: Two times a day (BID) | ORAL | Status: DC

## 2010-05-25 NOTE — Assessment & Plan Note (Signed)
Low risk Ok for surgery if echo normal

## 2010-05-25 NOTE — Progress Notes (Signed)
70 yo no previous cardiac history.  Needs clearance for left TKR with Dr Baron Hamper.  ECG mildly abnormal.  Nonspecific ST/T wave changes LAE and poor R wave progression.  No history of CAD, SSCP, palpitations, edema or syncope.  Has had a lot of chemo for breast CA and stem cell transplant for myeloma.  Activity limited by knee pain.  No signs of volume overload or CHF.  No previous surgical complications.  No bleeding diathesis when not on chemo or active myeloma.  Previous smoker quit 4 years ago with mild athmatic disease eased by inhalers.  Given history of chemo and ECG will do echo to make sure EF normal but otherwise should be clear for surgery and low risk  ROS: Denies fever, malais, weight loss, blurry vision, decreased visual acuity, cough, sputum, SOB, hemoptysis, pleuritic pain, palpitaitons, heartburn, abdominal pain, melena, lower extremity edema, claudication, or rash.   General: Affect appropriate Healthy:  appears stated age 89: normal Neck supple with no adenopathy JVP normal no bruits no thyromegaly Lungs clear with no wheezing and good diaphragmatic motion Heart:  S1/S2 no murmur,rub, gallop or click PMI normal Abdomen: benighn, BS positve, no tenderness, no AAA no bruit.  No HSM or HJR Distal pulses intact with no bruits No edema Neuro non-focal Skin warm and dry No muscular weakness  Medications Current Outpatient Prescriptions  Medication Sig Dispense Refill  . amitriptyline (ELAVIL) 10 MG tablet Take 10 mg by mouth 3 (three) times daily.        . butalbital-acetaminophen-caffeine (FIORICET WITH CODEINE) 50-325-40-30 MG per capsule Take 1 capsule by mouth as needed.        . citalopram (CELEXA) 40 MG tablet Take 40 mg by mouth daily.        . Fluticasone-Salmeterol (ADVAIR DISKUS) 100-50 MCG/DOSE AEPB Inhale 1 puff into the lungs every 12 (twelve) hours.        Marland Kitchen levothyroxine (SYNTHROID, LEVOTHROID) 150 MCG tablet Take 150 mcg by mouth daily.        . Loratadine  (CLARITIN) 10 MG CAPS 1 tab po qd       . naproxen sodium (ANAPROX) 220 MG tablet Take 220 mg by mouth as needed.        Marland Kitchen omeprazole (PRILOSEC) 40 MG capsule Take 40 mg by mouth daily.        . traMADol (ULTRAM) 50 MG tablet Take 50 mg by mouth every 6 (six) hours as needed.        Marland Kitchen DISCONTD: cefaclor (RANICLOR) 375 MG chewable tablet Chew 1 tablet (375 mg total) by mouth 2 (two) times daily.  14 tablet  0  . DISCONTD: folic acid (FOLVITE) 1 MG tablet Take 1 mg by mouth daily.        Marland Kitchen DISCONTD: guaiFENesin (MUCINEX) 600 MG 12 hr tablet Take 1,200 mg by mouth 2 (two) times daily.        Marland Kitchen DISCONTD: levothyroxine (SYNTHROID, LEVOTHROID) 112 MCG tablet Take 112 mcg by mouth daily.        Marland Kitchen DISCONTD: Multiple Vitamin (MULTIVITAMIN) capsule Take 1 capsule by mouth daily.        Marland Kitchen DISCONTD: potassium chloride SA (K-DUR,KLOR-CON) 20 MEQ tablet Take 20 mEq by mouth daily.        Marland Kitchen DISCONTD: prochlorperazine (COMPAZINE) 5 MG tablet Take 5 mg by mouth every 6 (six) hours as needed.        Marland Kitchen DISCONTD: valACYclovir (VALTREX) 500 MG tablet Take 500 mg by mouth 2 (  two) times daily.          Allergies Codeine; Iodinated diagnostic agents; Latex; and Sulfa antibiotics  Family History: No family history on file.  Social History: History   Social History  . Marital Status: Single    Spouse Name: N/A    Number of Children: N/A  . Years of Education: N/A   Occupational History  . Not on file.   Social History Main Topics  . Smoking status: Former Smoker -- 1.0 packs/day for 30 years    Types: Cigarettes    Quit date: 02/15/2006  . Smokeless tobacco: Never Used  . Alcohol Use: No  . Drug Use: No  . Sexually Active: Not on file   Other Topics Concern  . Not on file   Social History Narrative  . No narrative on file    Electrocardiogram:  NSR LAE nonspecific ST/T wave changes Poor R wave prgression  Assessment and Plan

## 2010-05-25 NOTE — Assessment & Plan Note (Signed)
Nonspecfic findings.  Check echo

## 2010-05-25 NOTE — Patient Instructions (Addendum)
Your physician has requested that you have an echocardiogram. Echocardiography is a painless test that uses sound waves to create images of your heart. It provides your doctor with information about the size and shape of your heart and how well your heart's chambers and valves are working. This procedure takes approximately one hour. There are no restrictions for this procedure.   

## 2010-06-02 ENCOUNTER — Ambulatory Visit (HOSPITAL_COMMUNITY): Payer: Medicare Other | Attending: Cardiovascular Disease | Admitting: Radiology

## 2010-06-02 DIAGNOSIS — Z01810 Encounter for preprocedural cardiovascular examination: Secondary | ICD-10-CM | POA: Insufficient documentation

## 2010-06-02 DIAGNOSIS — I059 Rheumatic mitral valve disease, unspecified: Secondary | ICD-10-CM | POA: Insufficient documentation

## 2010-06-02 DIAGNOSIS — R9431 Abnormal electrocardiogram [ECG] [EKG]: Secondary | ICD-10-CM | POA: Insufficient documentation

## 2010-07-05 ENCOUNTER — Telehealth: Payer: Self-pay | Admitting: *Deleted

## 2010-07-05 NOTE — Telephone Encounter (Signed)
PT AWARE OF ECHO RESULTS./CY 

## 2010-07-19 ENCOUNTER — Emergency Department (HOSPITAL_COMMUNITY): Payer: Medicare Other

## 2010-07-19 ENCOUNTER — Inpatient Hospital Stay (HOSPITAL_COMMUNITY)
Admission: EM | Admit: 2010-07-19 | Discharge: 2010-07-21 | DRG: 392 | Disposition: A | Discharge status: Home / Self Care | Payer: Medicare Other | Attending: Internal Medicine | Admitting: Internal Medicine

## 2010-07-19 DIAGNOSIS — E860 Dehydration: Secondary | ICD-10-CM | POA: Diagnosis present

## 2010-07-19 DIAGNOSIS — Z9481 Bone marrow transplant status: Secondary | ICD-10-CM

## 2010-07-19 DIAGNOSIS — F341 Dysthymic disorder: Secondary | ICD-10-CM | POA: Diagnosis present

## 2010-07-19 DIAGNOSIS — E039 Hypothyroidism, unspecified: Secondary | ICD-10-CM | POA: Diagnosis present

## 2010-07-19 DIAGNOSIS — R1013 Epigastric pain: Secondary | ICD-10-CM | POA: Diagnosis present

## 2010-07-19 DIAGNOSIS — C9000 Multiple myeloma not having achieved remission: Secondary | ICD-10-CM | POA: Diagnosis present

## 2010-07-19 DIAGNOSIS — K449 Diaphragmatic hernia without obstruction or gangrene: Secondary | ICD-10-CM | POA: Diagnosis present

## 2010-07-19 DIAGNOSIS — F410 Panic disorder [episodic paroxysmal anxiety] without agoraphobia: Secondary | ICD-10-CM | POA: Diagnosis present

## 2010-07-19 DIAGNOSIS — I959 Hypotension, unspecified: Secondary | ICD-10-CM | POA: Diagnosis present

## 2010-07-19 DIAGNOSIS — D72829 Elevated white blood cell count, unspecified: Secondary | ICD-10-CM | POA: Diagnosis present

## 2010-07-19 DIAGNOSIS — K2970 Gastritis, unspecified, without bleeding: Principal | ICD-10-CM | POA: Diagnosis present

## 2010-07-19 DIAGNOSIS — Z853 Personal history of malignant neoplasm of breast: Secondary | ICD-10-CM

## 2010-07-19 DIAGNOSIS — IMO0001 Reserved for inherently not codable concepts without codable children: Secondary | ICD-10-CM | POA: Diagnosis present

## 2010-07-19 DIAGNOSIS — R112 Nausea with vomiting, unspecified: Secondary | ICD-10-CM | POA: Diagnosis present

## 2010-07-19 LAB — LACTIC ACID, PLASMA: Lactic Acid, Venous: 1.7 mmol/L (ref 0.5–2.2)

## 2010-07-19 LAB — DIFFERENTIAL
Basophils Absolute: 0 10*3/uL (ref 0.0–0.1)
Basophils Relative: 0 % (ref 0–1)
Eosinophils Absolute: 0 10*3/uL (ref 0.0–0.7)
Monocytes Absolute: 1 10*3/uL (ref 0.1–1.0)
Neutro Abs: 12.7 10*3/uL — ABNORMAL HIGH (ref 1.7–7.7)
Neutrophils Relative %: 83 % — ABNORMAL HIGH (ref 43–77)

## 2010-07-19 LAB — URINALYSIS, ROUTINE W REFLEX MICROSCOPIC
Glucose, UA: 100 mg/dL — AB
Hgb urine dipstick: NEGATIVE
Ketones, ur: 15 mg/dL — AB
Protein, ur: 30 mg/dL — AB
Urobilinogen, UA: 1 mg/dL (ref 0.0–1.0)

## 2010-07-19 LAB — COMPREHENSIVE METABOLIC PANEL
ALT: 15 U/L (ref 0–35)
AST: 20 U/L (ref 0–37)
Albumin: 3.6 g/dL (ref 3.5–5.2)
Calcium: 10.2 mg/dL (ref 8.4–10.5)
Creatinine, Ser: 1.56 mg/dL — ABNORMAL HIGH (ref 0.50–1.10)
GFR calc non Af Amer: 33 mL/min — ABNORMAL LOW (ref >60)
Sodium: 135 mEq/L (ref 135–145)
Total Protein: 7.5 g/dL (ref 6.0–8.3)

## 2010-07-19 LAB — CK TOTAL AND CKMB (NOT AT ARMC)
CK, MB: 0.9 ng/mL (ref 0.3–4.0)
Relative Index: INVALID (ref 0.0–2.5)
Total CK: 21 U/L (ref 7–177)

## 2010-07-19 LAB — CBC
HCT: 34.5 % — ABNORMAL LOW (ref 36.0–46.0)
Hemoglobin: 11.5 g/dL — ABNORMAL LOW (ref 12.0–15.0)
MCH: 29.9 pg (ref 26.0–34.0)
MCHC: 33.3 g/dL (ref 30.0–36.0)
RDW: 13.3 % (ref 11.5–15.5)

## 2010-07-19 LAB — TROPONIN I: Troponin I: <0.3 ng/mL (ref <0.30)

## 2010-07-20 ENCOUNTER — Encounter (HOSPITAL_COMMUNITY): Payer: Self-pay

## 2010-07-20 LAB — CARDIAC PANEL(CRET KIN+CKTOT+MB+TROPI)
CK, MB: 1.2 ng/mL (ref 0.3–4.0)
CK, MB: 1.2 ng/mL (ref 0.3–4.0)
Relative Index: INVALID (ref 0.0–2.5)
Total CK: 24 U/L (ref 7–177)
Total CK: 24 U/L (ref 7–177)
Troponin I: <0.3 ng/mL (ref <0.30)

## 2010-07-20 LAB — COMPREHENSIVE METABOLIC PANEL
ALT: 12 U/L (ref 0–35)
AST: 16 U/L (ref 0–37)
CO2: 26 mEq/L (ref 19–32)
Calcium: 8.7 mg/dL (ref 8.4–10.5)
GFR calc non Af Amer: 37 mL/min — ABNORMAL LOW (ref >60)
Potassium: 3.9 mEq/L (ref 3.5–5.1)
Sodium: 137 mEq/L (ref 135–145)
Total Protein: 6.4 g/dL (ref 6.0–8.3)

## 2010-07-20 LAB — DIFFERENTIAL
Basophils Relative: 0 % (ref 0–1)
Lymphocytes Relative: 25 % (ref 12–46)
Lymphs Abs: 2.7 10*3/uL (ref 0.7–4.0)
Monocytes Relative: 11 % (ref 3–12)
Neutro Abs: 6.7 10*3/uL (ref 1.7–7.7)
Neutrophils Relative %: 63 % (ref 43–77)

## 2010-07-20 LAB — URINE CULTURE

## 2010-07-20 LAB — CBC
HCT: 29.7 % — ABNORMAL LOW (ref 36.0–46.0)
Hemoglobin: 9.7 g/dL — ABNORMAL LOW (ref 12.0–15.0)
MCH: 29.9 pg (ref 26.0–34.0)
RBC: 3.24 MIL/uL — ABNORMAL LOW (ref 3.87–5.11)

## 2010-07-20 NOTE — H&P (Signed)
NAMEMarland Norris  Nancy, Norris               ACCOUNT NO.:  [id number]  MEDICAL RECORD NO.:  [id number]  LOCATION:  WLED                         FACILITY:  [facility]  PHYSICIAN:  Jackie Plum, MD  DATE OF BIRTH:  1940-05-13  DATE OF ADMISSION:  07/19/2010 DATE OF DISCHARGE:                             HISTORY & PHYSICAL   PRIMARY CARE PHYSICIAN:  [name], MD  PRIMARY HEM/ONCOLOGY:  [name], MD  History obtainable from the patient.  CHIEF COMPLAINT:  Epigastric pain with vomiting of about 2 days' duration.  HISTORY:  The patient is a 70 year old Caucasian female with history of anhedonia, multiple myeloma status post stem cell transplant, presenting to the emergency room with abdominal pain mainly located in the epigastric region, which according to the patient had been recurrent for months; however, the present episode is of 2 days' duration.  The pain is described as achy, sometimes causing spasm, and located in the epigastric region with multiple episode of vomiting that was non- projectile.  The patient sometimes has a feeling of acid running off her throat to her mouth.  The vomitus was nonbloody and contained ingested food as well as bile.  This was said to be associated with chills, but the patient denied any fever.  She also claims she has slight rigor. She denied any chest pain.  She denied any shortness of breath.  She denied any cough.  She denied any diarrhea or hematochezia, but the pain in the epigastric region was said to be getting more and more intense with persistent episodes of vomiting.  Hence, the patient presented to the emergency room to be evaluated.  PAST MEDICAL HISTORY:  Positive for: 1. Asthma. 2. COPD. 3. Hiatal hernia. 4. Anxiety disorder. 5. History of breast cancer. 6. Depression. 7. Fibromyalgia. 8. Hypothyroidism. 9. Multiple myeloma. 10.Osteoarthritis. 11.Panic attack.  PAST SURGICAL HISTORY: 1. Multiple myeloma status  post stem cell transplant. 2. Cholecystectomy. 3. Hysterectomy. 4. History of breast CA status post mastectomy.  PREADMISSION MEDICATIONS WITHOUT DOSAGES: 1. Synthroid. 2. Prilosec. 3. Celexa. 4. Amitriptyline. 5. Advair Diskus. 6. Claritin. 7. Tramadol. 8. Vitamin D3.  ALLERGIES:  CODEINE, IVP DYE, IODINE, LATEX, SULFA (SULFONAMIDE), AND VERSED.  SOCIAL HISTORY:  Negative for alcohol or tobacco use.  The patient is retired.  FAMILY HISTORY:  Michela Pitcher to be positive for thyroid disease.  REVIEW OF SYSTEMS:  The patient denies any history of headaches.  No blurred vision.  Denies any fever, but has chills with rigor.  No chest pain.  No shortness of breath.  No cough.  Complained of persistent epigastric achy pain with multiple episode of bilious vomiting.  The patient denies any diarrhea or hematochezia.  No dysuria or hematuria. No swelling of the lower extremities.  No intolerance to heat or cold. No neuropsychiatric disorder.  PHYSICAL EXAMINATION:  GENERAL:  Elderly lady, moderately dehydrated, not in any obvious respiratory distress. VITAL SIGNS:  Blood pressure is 100/60, pulse 76, respiratory rate 18, temperature is 99.0. HEENT:  Pallor but pupils are reactive to light and extraocular muscles are intact. NECK:  No jugular venous distention.  No carotid bruit.  No lymphadenopathy. CHEST:  Clear to auscultation. HEART:  Heart  sounds are 1 and 2. ABDOMEN:  Soft with marked tenderness in the epigastric region.  No guarding.  No rigidity.  Liver, spleen, kidney not palpable.  Bowel sounds are hypoactive. EXTREMITIES:  No pedal edema. NEUROLOGIC:  Nonfocal. MUSCULOSKELETAL SYSTEM:  Show arthritic changes in the knee. Neuropsychiatric evaluation is unremarkable. SKIN:  Showed decreased turgor.  LABORATORY DATA:  Initial chemistry shows sodium of 135, potassium of 3.5, chloride of 90 with a bicarb of 22, glucose is 110, BUN is 20, creatinine is 1.56.  LFTs showed total  protein 7.5, albumin 3.6, AST 20, ALT 59, alk phos of 130, lipase is 39.  Lactic acid level is 1.7.  First set of cardiac markers troponin-I less than 0.30.  Urine microscopy showed urine wbc's 3-6 with few bacteria.  Urine microscopy showed small leukocyte esterase with negative nitrite.  Hematological indices showed WBC of 8.3, hemoglobin of 9.5, hematocrit 34.5, MCV of 89.60, platelet count of 282, neutrophils 83%, elevated and absolute granulocyte count is 12.7, elevated.  EKG showed normal sinus rhythm with a rate of 90, no acute ST-wave change noted.  Imaging studies done include CT of the abdomen and pelvis which showed no acute findings in the abdomen or pelvis and there is small hiatal hernia.  Repeat acute abdominal series did not show any acute findings.  IMPRESSION:  Recurrent epigastric pain.  Differentials will include; A.  Incarcerated hiatal hernia. B.  Gastritis. C.  Peptic ulcer disease. D.  Pyloric stenosis/gastric outlet obstruction.  Other medical problems will include; 1. Dehydration. 2. Hypotension. 3. Leukocytosis, source is unknown. 4. Asthma/chronic obstructive pulmonary disease. 5. Anxiety/depression. 6. Multiple myeloma status post stem cell transplant. 7. Hypothyroidism. 8. Fibromyalgia.  PLAN:  To admit the patient to general medical floor.  The patient will be adequately rehydrated with normal saline plus 10 mEq of KCl IV to go at rate of 120 mL an hour.  Pain control will be with morphine 2 mg IV q.4 p.r.n. and nausea will be controlled with Zofran 4 mg IV q.4 p.r.n. The patient will be on Protonix 40 mg IV q.12.  She will be restarted on her home meds and this will include Synthroid and Celexa and doses will be adjusted appropriately as soon as the medication is reconciled.  The patient will empirically be treated for leukocytosis with Rocephin 1 g IV q. 24 and DVT prophylaxis will be done with Lovenox 40 mg subcu q.24. Further workup to be done  on this patient will include cardiac enzymes q.6 x3. blood culture x2 before starting IV antibiotics, CBC, CMP and magnesium will be repeated in the a.m.  The patient will be followed and evaluated on day-to-day basis.     Jackie Plum, MD     CN/MEDQ  D:  07/20/2010  T:  07/20/2010  Job:  [id number]  Electronically Signed by Jackie Plum  on 07/20/2010 05:49:16 AM

## 2010-07-21 ENCOUNTER — Other Ambulatory Visit (HOSPITAL_COMMUNITY): Payer: Medicare Other

## 2010-07-21 LAB — H. PYLORI ANTIBODY, IGG: H Pylori IgG: 0.73 {ISR}

## 2010-07-21 LAB — APTT: aPTT: 41 seconds — ABNORMAL HIGH (ref 24–37)

## 2010-07-21 LAB — PROTIME-INR: INR: 1.04 (ref 0.00–1.49)

## 2010-07-24 ENCOUNTER — Encounter (HOSPITAL_COMMUNITY)
Admission: RE | Admit: 2010-07-24 | Discharge: 2010-07-24 | Disposition: A | Discharge status: Home / Self Care | Payer: Medicare Other | Source: Ambulatory Visit | Attending: Orthopedic Surgery | Admitting: Orthopedic Surgery

## 2010-07-24 LAB — URINALYSIS, ROUTINE W REFLEX MICROSCOPIC
Glucose, UA: 100 mg/dL — AB
Ketones, ur: NEGATIVE mg/dL
Nitrite: NEGATIVE
Protein, ur: 30 mg/dL — AB
Urobilinogen, UA: 0.2 mg/dL (ref 0.0–1.0)

## 2010-07-24 LAB — COMPREHENSIVE METABOLIC PANEL
Alkaline Phosphatase: 119 U/L — ABNORMAL HIGH (ref 39–117)
BUN: 17 mg/dL (ref 6–23)
Chloride: 100 mEq/L (ref 96–112)
GFR calc Af Amer: 48 mL/min — ABNORMAL LOW (ref >60)
GFR calc non Af Amer: 40 mL/min — ABNORMAL LOW (ref >60)
Glucose, Bld: 107 mg/dL — ABNORMAL HIGH (ref 70–99)
Potassium: 4 mEq/L (ref 3.5–5.1)
Total Bilirubin: 0.5 mg/dL (ref 0.3–1.2)

## 2010-07-24 LAB — DIFFERENTIAL
Basophils Absolute: 0 10*3/uL (ref 0.0–0.1)
Eosinophils Absolute: 0.2 10*3/uL (ref 0.0–0.7)
Eosinophils Relative: 2 % (ref 0–5)
Lymphs Abs: 2.5 10*3/uL (ref 0.7–4.0)

## 2010-07-24 LAB — PROTIME-INR
INR: 0.94 (ref 0.00–1.49)
Prothrombin Time: 12.8 seconds (ref 11.6–15.2)

## 2010-07-24 LAB — CBC
MCH: 30.4 pg (ref 26.0–34.0)
MCHC: 33.7 g/dL (ref 30.0–36.0)
Platelets: 306 10*3/uL (ref 150–400)
RBC: 3.88 MIL/uL (ref 3.87–5.11)

## 2010-07-24 LAB — SURGICAL PCR SCREEN: MRSA, PCR: NEGATIVE

## 2010-07-26 LAB — CULTURE, BLOOD (ROUTINE X 2): Culture  Setup Time: 201207050925

## 2010-07-28 ENCOUNTER — Inpatient Hospital Stay (HOSPITAL_COMMUNITY)
Admission: RE | Admit: 2010-07-28 | Discharge: 2010-07-31 | DRG: 470 | Disposition: A | Discharge status: Skilled Nursing Facility | Payer: Medicare Other | Source: Ambulatory Visit | Attending: Orthopedic Surgery | Admitting: Orthopedic Surgery

## 2010-07-28 ENCOUNTER — Inpatient Hospital Stay (HOSPITAL_COMMUNITY): Payer: Medicare Other

## 2010-07-28 DIAGNOSIS — Z886 Allergy status to analgesic agent status: Secondary | ICD-10-CM

## 2010-07-28 DIAGNOSIS — E039 Hypothyroidism, unspecified: Secondary | ICD-10-CM | POA: Diagnosis present

## 2010-07-28 DIAGNOSIS — Z01812 Encounter for preprocedural laboratory examination: Secondary | ICD-10-CM

## 2010-07-28 DIAGNOSIS — IMO0001 Reserved for inherently not codable concepts without codable children: Secondary | ICD-10-CM | POA: Diagnosis present

## 2010-07-28 DIAGNOSIS — Z882 Allergy status to sulfonamides status: Secondary | ICD-10-CM

## 2010-07-28 DIAGNOSIS — Z853 Personal history of malignant neoplasm of breast: Secondary | ICD-10-CM

## 2010-07-28 DIAGNOSIS — M1710 Unilateral primary osteoarthritis, unspecified knee: Principal | ICD-10-CM | POA: Diagnosis present

## 2010-07-28 DIAGNOSIS — Z91040 Latex allergy status: Secondary | ICD-10-CM

## 2010-07-29 LAB — CBC
MCV: 90.3 fL (ref 78.0–100.0)
Platelets: 271 10*3/uL (ref 150–400)
RBC: 2.78 MIL/uL — ABNORMAL LOW (ref 3.87–5.11)
WBC: 10.4 10*3/uL (ref 4.0–10.5)

## 2010-07-29 LAB — BASIC METABOLIC PANEL
CO2: 31 mEq/L (ref 19–32)
Chloride: 100 mEq/L (ref 96–112)
Creatinine, Ser: 1.24 mg/dL — ABNORMAL HIGH (ref 0.50–1.10)

## 2010-07-30 LAB — CBC
HCT: 24.5 % — ABNORMAL LOW (ref 36.0–46.0)
Hemoglobin: 8.1 g/dL — ABNORMAL LOW (ref 12.0–15.0)
MCH: 30.3 pg (ref 26.0–34.0)
MCHC: 33.1 g/dL (ref 30.0–36.0)
MCV: 91.8 fL (ref 78.0–100.0)
Platelets: 296 10*3/uL (ref 150–400)
RBC: 2.67 MIL/uL — ABNORMAL LOW (ref 3.87–5.11)
RDW: 13.6 % (ref 11.5–15.5)
WBC: 9.9 10*3/uL (ref 4.0–10.5)

## 2010-07-30 LAB — BASIC METABOLIC PANEL
BUN: 15 mg/dL (ref 6–23)
CO2: 30 mEq/L (ref 19–32)
Chloride: 98 mEq/L (ref 96–112)
Creatinine, Ser: 1.22 mg/dL — ABNORMAL HIGH (ref 0.50–1.10)
Glucose, Bld: 107 mg/dL — ABNORMAL HIGH (ref 70–99)

## 2010-07-31 LAB — CROSSMATCH: Unit division: 0

## 2010-07-31 LAB — CBC
MCH: 29.9 pg (ref 26.0–34.0)
MCHC: 33.1 g/dL (ref 30.0–36.0)
Platelets: 282 10*3/uL (ref 150–400)

## 2010-07-31 LAB — BASIC METABOLIC PANEL
Calcium: 8.7 mg/dL (ref 8.4–10.5)
GFR calc non Af Amer: 44 mL/min — ABNORMAL LOW (ref >60)
Sodium: 138 mEq/L (ref 135–145)

## 2010-08-03 NOTE — Discharge Summary (Signed)
Nancy Norris, Nancy Norris               ACCOUNT NO.:  [id number]  MEDICAL RECORD NO.:  [id number]  LOCATION:  [id number]                         FACILITY:  [facility]  PHYSICIAN:  Sharlet Salina, M.D.   DATE OF BIRTH:  19-Apr-1940  DATE OF ADMISSION:  07/19/2010 DATE OF DISCHARGE:  07/21/2010                              DISCHARGE SUMMARY   DISCHARGE DIAGNOSES: 1. Dehydration. 2. Hypotension. 3. Leukocytosis. 4. Epigastric pain and tenderness. 5. Multiple myeloma, status post stem cell transplant. 6. Hypothyroidism. 7. Fibromyalgia. 8. Anxiety disorder. 9. Panic attacks.  PAST SURGICAL HISTORY: 1. Multiple myeloma, status post stem cell transplant. 2. Status post cholecystectomy. 3. Status post hysterectomy. 4. History of breast cancer, status post mastectomy.  DISCHARGE MEDICATIONS: 1. Protonix 40 mg b.i.d. 2. Advair 100/50 twice daily. 3. Artificial tears as needed. 4. Celexa 40 mg daily. 5. Claritin 10 mg daily. 6. Elavil 10 mg up to four times daily. 7. Glucosamine 1 tablet daily. 8. Levothyroxine 150 mcg 1-1/2 tablet on Tuesday and Thursday then 1     tablet all other days. 9. Senna daily as needed at bedtime. 10.Tramadol 50 mg 1-2 tablets every 4 hours as needed. 11.Vitamin D3 10,000 units daily.  FOLLOWUP APPOINTMENTS:  Patient is to follow up with Dr. Paulita Fujita in 3 to 4 weeks, phone number [phone].  Patient is to follow up with Dr. [name] in 1 to 2 weeks.  PROCEDURES:  Patient had a CT of the abdomen and pelvis.  No acute finding in the abdomen or pelvis, stable left adrenal nodule likely adenoma, small hiatal hernia.  Acute series with chest and abdomen, no acute findings.  EGD done on the June 21, 2010, small hiatal hernia and mild gastritis, otherwise, normal.  CONSULTANTS:  Nancy Silence, MD., [facility].  HISTORY OF PRESENT ILLNESS:  Patient is a 70 year old white female with past medical history significant for above-mentioned problems.  Patient presented to the  emergency room with abdominal pain located in the epigastric region for the past few months; however, the patient's present episode is of 2 days' duration.  The patient describes this ache sometimes causes spasm and located in the epigastric region with multiple episodes of vomiting that was nonprojectile.  Patient sometimes has a feeling of acid running of her throat to her mouth.  Please see admission note for remainder of history.  PAST MEDICAL HISTORY:  Per admission H and P.  FAMILY HISTORY:  Per admission H and P.  SOCIAL HISTORY:  Per admission H and P.  MEDICATIONS:  Per admission H and P.  ALLERGIES:  Per admission H and P.  REVIEW OF SYSTEMS:  Per admission H and P.  DISCHARGE PHYSICAL EXAMINATION:  VITAL SIGNS:  At the time of discharge, temperature 98.7, pulse 79, respirations 18, blood pressure 101/61, pulse ox 94% on room air. GENERAL:  The patient is lying in bed, well-nourished white female. HEENT:  Head is normocephalic, atraumatic.  Pupils are reactive to light without erythema. CARDIOVASCULAR:  Regular rhythm. LUNGS:  Clear bilaterally. ABDOMEN:  Soft with epigastric tenderness.  Positive bowel sounds. Patient is without edema.  HOSPITAL COURSE: 1. Epigastric tenderness and nausea/vomiting:  The patient was  admitted to the hospital, placed on proton pump inhibitor twice a     day.  GI was consulted.  Dr. Paulita Fujita did an endoscopy that showed     mild gastritis, otherwise, normal.  The patient was on PPI, was     replaced with Protonix, and she will be discharged home on Protonix     and follow up with Dr. Paulita Fujita in 3-4 weeks. 2. Hypotension/leukocytosis:  No sign of infection was discovered.     Her hypotension resolved.  Blood cultures were negative.  Urine     cultures were negative.  Her hypotension resolved and she is     normotensive.  She will be discharged to follow up with her PCP.  LABORATORY DATA:  Labs at the time of discharge:  H pylori  antibody IgG 0.73, blood cultures negative x3.  PTT 41.  PT 13.8.  INR 1.04.  Urine culture 40,000 colony morphology.  Cardiac markers negative.  Magnesium 2.1.  Lipase 339.  AST 12, ALT 16, alk phos 102.  Time spent with the patient in doing this discharge is approximately 35 minutes.     Sharlet Salina, M.D.     [place]/MEDQ  D:  07/21/2010  T:  07/21/2010  Job:  [id number]  cc:   Nancy Silence, MD Fax: [phone]  [name], M.D. FaxPO:[phone]  Electronically Signed by Sharlet Salina M.D. on 08/03/2010 11:00:56 PM

## 2010-08-08 NOTE — Op Note (Signed)
NAMEMarland Kitchen  Nancy Norris, Nancy Norris NO.:  [id number]  MEDICAL RECORD NO.:  [id number]  LOCATION:  5038                         FACILITY:  [facility]  PHYSICIAN:  Lockie Pares, M.D.    DATE OF BIRTH:  02-10-1940  DATE OF PROCEDURE: DATE OF DISCHARGE:                              OPERATIVE REPORT   INDICATIONS:  . Left knee osteoarthritis, severe lateral compartment __________ hospitalization, knee replacement.  PREOPERATIVE DIAGNOSIS:  Severe osteoarthritis, left knee.  POSTOPERATIVE DIAGNOSIS:  Severe osteoarthritis, left knee.  OPERATION:  Left total knee replacement (Sigma cemented knee, size 3 femur, tibia, 12.5 mm and 35-mm patella.  SURGEON:  Lockie Pares, MD  ASSISTANT:  [employer]. Mancel Bale, [place]  TOURNIQUET TIME:  One hour.  DESCRIPTION OF PROCEDURE:  Sterile prep and drape, exsanguination of legs, inflation of 375.  Straight skin incision with medial parapatellar approach to the knee made.  We performed distal femoral cut.  We kept him about  10-mm above with 5-degree valgus inclination.  We then resected 10-mm off the most __________  guide.  We then placed a spacer block at 12.5 mm.  No releases were actually necessary.  Then referencing the anterior femur, we did a provisional size with a  size 3 femur.  We then placed 2 pins with the appropriate jig setting the rotation with the flexion gap with a 12.5 mm flange.  We then placed pins, placed the 5-in-1 cutting block and performed anterior-posterior chamfer cuts and then checked the flexion gap at 12.5.  Attention was next directed to the tibia.  In dealing with the tibia though we did detect some  posterior osteophytes and removed some capsule and  posterior osteophytes from the posterior aspect of the femur.  The tibia was __________ standard keel with a size 3 tibia.  We then placed the tibial trial, and went through the  back up to the femur to place the guide through the box cut.  I performed a box cut  without difficulty.  Complete release of the __________ were made.  The trials were placed on the tibia, femur, and cut leaving about 14 to 15 mm of native patella for 3 peg patella and then trialed the  patella.  Full extension was noted to be excellent on the table, excellent  flexion to 110.  No instability to varus or valgus.  No __________ anterior- posterior instability was not present.  The trials were then been made. The bony surfaces were irrigated, prepared the cement on the back table. We inserted the final component, tibia followed by femur.  We did use trial bearing, the cement was allowed to harden.  __________ placed as well and then once we checked stability again, the trial bearing was removed, the tourniquet was released.  Prior to doing this, we  checked for any excess cement and removed it.  There was no excessive bleeding over the femoral nerve and then placed the final bearing.  Range of motion parameters and all parameters for stability were again checked.  Hemovac drain was placed exiting superolaterally.  Closure was effected with #1 Ethibond, 2-0 Vicryl, and skin clips.  Marcaine infiltrating the capsule.  Lightly  pressured sterile dressing and knee immobilizer applied, taken to recovery room in stable condition.     Lockie Pares, M.D.     WDC/MEDQ  D:  07/28/2010  T:  07/28/2010  Job:  [id number]  Electronically Signed by W. Hailly Fess M.D. on 08/08/2010 08:22:29 AM

## 2010-08-20 NOTE — Discharge Summary (Signed)
Nancy Norris, LEATON               ACCOUNT NO.:  [id number]  MEDICAL RECORD NO.:  [id number]  LOCATION:  [id number]                         FACILITY:  [facility]  PHYSICIAN:  Lockie Pares, M.D.    DATE OF BIRTH:  1940/11/06  DATE OF ADMISSION:  07/28/2010 DATE OF DISCHARGE:                              DISCHARGE SUMMARY   DATE OF TRANSFER:  July 31, 2010.  DIAGNOSIS:  Left knee end-stage osteoarthritis, procedure while in hospital left total knee arthroplasty.  SECONDARY DIAGNOSES: 1. Hypothyroidism. 2. Fibromyalgia.  DISCHARGE SUMMARY:  The patient is a 70 year old woman with many years history of left knee pain.  She has failed conservative treatment including physical therapy and knee scope, which showed grade 4 changes, nonsteroidal anti-inflammatories injections and now wishes to proceed with a total knee arthroplasty after discussing risks versus benefits of the procedure.  Primary MD is Dr. [name] and Cardiologist is Dr. [name], who both gave clearance prior to the surgery.  ALLERGIES:  SIGNIFICANT FOR ALLERGIES TO CODEINE, MORPHINE, LATEX, IODINE, DYE, SULFA, AND ADHESIVES, PARTICULARLY A TAPE.  PAST MEDICAL HISTORY:  Also significant for, 1. Hypothyroidism. 2. Fibromyalgia. 3. Asthma. 4. Headaches and migraines. 5. The patient has a distant history of scarlet fever and whooping     cough as a child. 6. History of breast cancer.  PAST SURGICAL HISTORY:  Significant for tonsillectomy, lumpectomy, gallbladder surgery, tubal ligation, hysterectomy, mastectomy with reconstruction and arthroscopic procedure of her left knee.  MEDICATIONS:  At the time of admission, Synthroid, Celexa, Prilosec, Elavil, Claritin, Advair Diskus, Tramadol, vitamin D3, glucosamine, and Senokot.  PHYSICAL EXAMINATION:  VITAL SIGNS:  At the time of admission, the patient's temperature 97.5, pulse 78, respirations 16, blood pressure 124/80. GENERAL:  The patient is 5 feet 5 inches, 108 pounds  female with a BMI of 29.6. HEENT:  Head is normocephalic, atraumatic.  Ears, TMs are clear.  Eyes, pupils equal, round, and reactive to light and accommodation.  Nose is patent..  Throat, benign. NECK:  Supple, full range of motion. CHEST:  The lungs are clear to auscultation. HEART:  Regular rate and rhythm. ABDOMEN:  Soft and nontender.  She was neurovascularly intact with positive neuropathy to bilateral feet with decreased light touch and tingling sensations throughout. SKIN:  The patient's skin has cyanotic appearance to both feet, but no break in skin. MUSCULOSKELETAL:  Shows range of motion left knee, 5-110 degrees positive crepitus and 5-degree varus deformity with stable ligaments.  LABORATORY DATA:  Preoperative labs including CBC, CMET, chest x-ray, EKG, PT, and PTT within acceptable limits.  HOSPITAL COURSE:  On the date of admission, the patient was taken to the operating room, where she underwent left total knee replacement using DePuy Sigma size 3 femur/3 tibia, 12.5-mm bearing and 35-mm Patella components cemented.  She was placed on perioperative antibiotics.  She was placed on postoperative Lovenox prophylaxis as well as TED hose and early mobilization.  She began physical therapy in the PACU using CPM and physical therapy continued throughout her hospital stay.  She was placed on PCA, Dilaudid pump for pain control.  Postoperative day #1, the patient complained of some mild dizziness, particularly when rising temperature was  98.5, pulse 105, respirations 16, blood pressure 109/68 with a O2 sat 96 on O2 of 2 L.  Hemoglobin dropped to 8.1 with WBCs of 7.9.  Her skin was seem to be washed out and pallid.  Hemovac drain was discontinued without difficulty as was her PCA.  She was tolerating CPM 0 to 6 degrees.  Because of symptomatic acute blood loss anemia, she was given one unit packed red cells.  Physical therapy was continued because she was making slow, but  steady progress in physical therapy and after reviewing the patient's home situation, it was decided that she might benefit from a stay in a rehab facility and social work was consulted. A bed was sought, a form was sent out.  Remainder of the patient's hospital course was uneventful.  On the day of her transfer, the patient's temperature was 98.9, pulse of 101, respirations of 16, blood pressure 96/63 without symptoms of dizziness or orthostasis.  She was at 92% O2 saturation on room air.  Labs on the date of her transfer, WBC of 8.1, hemoglobin 9.4, hematocrit 27.5.  Sodium 138, potassium 3.4, creatinine of 1.2, GFR 44 without any evidence of significant change in the trending of her labs.  Examination of the patient's wound shows it to be clean and dry and healing without difficulty.  She is tolerating CPM at 0 to 60 degrees without difficulty, stable ligamentously, trace effusion, minimal swelling throughout the calf and nontender to [facility] exam.  She is tolerating food well without any nausea or vomiting and pain is well controlled with oral Percocet.  Because of her stable condition, she was okay for transfer to [facility] for additional rehab.  MEDICATIONS:  At the time of her discharge her medications were, 1. Acetaminophen 325 mg one to two by mouth every four hours as needed     for fever greater than 101. 2. Chloraseptic spray one spray topically as needed. 3. Colace 100 mg b.i.d. 4. Lovenox 30 mg injections subcutaneously b.i.d. for an additional 10     days. 5. Maalox suspension 30 mL by mouth every 4 hours as needed. 6. She should continue using her TED hose until she return to see Dr.     French Ana in 10 days' time. 7. Robaxin 500 mg p.o. q.6 h p.r.n. spasm. 8. Percocet 5/325 one to two tablets by mouth every 4 hours as needed. 9. Advair Diskus 100/50 one puff inhaled twice daily. 10.Artificial tears 1 drop in both eyes as needed. 11.Celexa 40 mg p.o.  by mouth daily. 12.Claritin 10 mg p.o. by mouth daily. 13.Elavil 10 mg 1 tablet by mouth four times daily. 14.Levothyroxine 150 mcg 1 to 1-1/2 tablets by mouth daily. 15.Protonix 40 mg by mouth b.i.d. 16.Senna tablets by mouth daily at bedtime as needed. 17.Vitamin D one capsule by mouth daily.  ACTIVITIES:  Weightbearing as tolerated with total knee precaution using walker and assistance as needed.  Continue with CPM 0 to 60 degrees, increasing by 5-10 degrees daily until achieving 9 degrees without difficulty.  Dressing changes daily or as needed to keep wound clean, dry, and covered.  The patient may shower postoperative day #5 as well and she is seated during the shower.  Clean dry dressing should be reapplied after the shower.  She should return to see Dr. French Ana in 10 days' time and already has appointment on the August 10, 2010.  She should return sooner, if she should have any increase in temperature greater than 101,  any drainage from the wound that looks like pus or has a malodor or pain that is not well-controlled while on her oral pain medication.  DIET:  Regular.     [employer]. Mercie Eon   ______________________________ Lockie Pares, M.D.    JBR/MEDQ  D:  07/31/2010  T:  07/31/2010  Job:  [id number]  Electronically Signed by Allen Norris P.A. on 08/09/2010 07:37:25 PM Electronically Signed by Viona Gilmore. Aydeen Blume M.D. on 08/20/2010 02:27:47 PM

## 2010-08-25 NOTE — Consult Note (Signed)
Nancy Norris, Nancy Norris               ACCOUNT NO.:  [id number]  MEDICAL RECORD NO.:  [id number]  LOCATION:  [id number]                         FACILITY:  [facility]  PHYSICIAN:  Arta Silence, MD     DATE OF BIRTH:  Feb 23, 1940  DATE OF CONSULTATION:  07/20/2010 DATE OF DISCHARGE:                                CONSULTATION   REASON FOR CONSULTATION:  Abdominal pain.  CONSULTING PHYSICIAN:  Jackie Plum, MD  CHIEF COMPLAINT:  Abdominal pain.  HISTORY OF PRESENT ILLNESS:  Nancy Norris is a 70 year old female with history of multiple myeloma with prior stem-cell transplant.  She has had a couple months of vague substernal and epigastric discomfort that is described as spastic in nature and intermittent.  Pain has become much worse over the past day or two necessitating her evaluation at the emergency room.  She was found to be somewhat hypotensive at the emergency department visit and thus was admitted for hydration and further evaluation.  Eating does not appreciably worsen her abdominal pain symptoms and there are no other obvious triggers.  She has some associated nausea but no vomiting or diarrhea.  She denies any blood or stool.  She denies any dysphagia but does have chronic globus sensation. She has had no appreciable loss of appetite, weight loss or change in bowel habits.  She did undergo an endoscopy and colonoscopy in January of 2011 for evaluation of weight loss, both the studies of which were essentially unrevealing.  Her current discomfort is distinct from the problems that  ultimately led to her upper endoscopy.  Past medical history, past surgical history, home medications, allergies, family history, social history, review of systems; all from dictated note from Dr. Kirke Corin dated July 20, 2010; I have reviewed and I agree.  PHYSICAL EXAMINATION:  VITAL SIGNS:  Blood pressure 107/70, heart rate 76, respiratory rate 14, temperature 97.7, oxygen saturation 99% on  room air. GENERAL:  In general, Nancy Norris is in no acute distress, nontoxic appearing. ENT:  Normocephalic, atraumatic.  No oropharyngeal lesions.  Slightly dry mucous membranes. EYES:  Sclerae anicteric.  Conjunctivae are pink. NECK:  Supple without thyromegaly, lymphadenopathy or bruits. LUNGS:  Clear to auscultation without rales, rhonchi or wheeze. HEART:  Regular rhythm, normal rate without murmur, rub or gallop. ABDOMEN:  Soft, mild epigastric tenderness.  No rebound, rigidity or guarding.  No liver or splenic enlargement.  No bulging flanks to suggest ascites.  Normoactive bowel sounds. EXTREMITIES:  No peripheral cyanosis, clubbing or edema. NEUROLOGIC:  Diffusely nonfocal without lateralizing signs. LYMPHATICS:  No palpable axillary, submandibular, supraclavicular adenopathy. SKIN:  Diminished turgor.  No rash or ecchymoses. PSYCHIATRIC:  Normal mood and affect.  LABORATORY STUDIES:  Hemoglobin is 9.7, white count 10.7, platelet count is 211.  Sodium 137, potassium 3.9, chloride 104, bicarb 26, BUN 17, creatinine 1.4, total protein is 6.4, albumin 3.0, total bilirubin is 1.0, alk phos 102, AST 16, ALT 12, lipase normal at 30.  Non-lactic acid normal at 1.7.  RADIOLOGY:  CT of abdomen and pelvis dated July 20, 2010, which shows no acute findings.  She has a stable left adrenal adenoma, small hiatal hernia.  IMPRESSION:  Nancy Norris is  a 70 year old female presenting with vague epigastric and substernal discomfort that does appreciably worsen over the past couple of days.  There is no associated nausea, vomiting, diarrhea or blood in stool.  Symptoms are refractory to Prilosec 40 mg twice a day.  Differential is broad but could include esophageal reflux with possible concomitant GI tract motility disorder.  Peptic ulcer disease, gastritis are among other differential considerations.  PLAN: 1. Agree with supportive care with IV fluids, proton pump inhibitor     and  supportive care. 2. We will proceed with upper endoscopy tomorrow afternoon for further     evaluation.  The study is unrevealing, as I suspect it will be,     likely, and if the patient is doing better, she can hopefully be     discharged in the next day or two.  Thanks for allowing me to participate in Nancy Norris care.     Arta Silence, MD     WO/MEDQ  D:  07/20/2010  T:  07/20/2010  Job:  [id number]  Electronically Signed by Arta Silence  on 08/25/2010 07:22:42 PM

## 2010-09-12 ENCOUNTER — Other Ambulatory Visit: Payer: Self-pay | Admitting: Internal Medicine

## 2010-09-12 ENCOUNTER — Encounter (HOSPITAL_BASED_OUTPATIENT_CLINIC_OR_DEPARTMENT_OTHER): Payer: Medicare Other | Admitting: Internal Medicine

## 2010-09-12 DIAGNOSIS — Z9484 Stem cells transplant status: Secondary | ICD-10-CM

## 2010-09-12 DIAGNOSIS — Z853 Personal history of malignant neoplasm of breast: Secondary | ICD-10-CM

## 2010-09-12 DIAGNOSIS — C9000 Multiple myeloma not having achieved remission: Secondary | ICD-10-CM

## 2010-09-12 LAB — CBC WITH DIFFERENTIAL/PLATELET
Basophils Absolute: 0 10*3/uL (ref 0.0–0.1)
HCT: 34.6 % — ABNORMAL LOW (ref 34.8–46.6)
HGB: 11.6 g/dL (ref 11.6–15.9)
MCH: 31 pg (ref 25.1–34.0)
MONO#: 0.7 10*3/uL (ref 0.1–0.9)
NEUT%: 57.4 % (ref 38.4–76.8)
WBC: 7.1 10*3/uL (ref 3.9–10.3)
lymph#: 2.2 10*3/uL (ref 0.9–3.3)

## 2010-09-14 ENCOUNTER — Encounter (HOSPITAL_BASED_OUTPATIENT_CLINIC_OR_DEPARTMENT_OTHER): Payer: Medicare Other | Admitting: Internal Medicine

## 2010-09-14 DIAGNOSIS — Z9484 Stem cells transplant status: Secondary | ICD-10-CM

## 2010-09-14 DIAGNOSIS — Z853 Personal history of malignant neoplasm of breast: Secondary | ICD-10-CM

## 2010-09-14 DIAGNOSIS — C9000 Multiple myeloma not having achieved remission: Secondary | ICD-10-CM

## 2010-09-14 LAB — COMPREHENSIVE METABOLIC PANEL
Albumin: 4.4 g/dL (ref 3.5–5.2)
BUN: 17 mg/dL (ref 6–23)
CO2: 28 mEq/L (ref 19–32)
Calcium: 10 mg/dL (ref 8.4–10.5)
Chloride: 101 mEq/L (ref 96–112)
Glucose, Bld: 110 mg/dL — ABNORMAL HIGH (ref 70–99)
Potassium: 4.1 mEq/L (ref 3.5–5.3)

## 2010-09-14 LAB — IGG, IGA, IGM: IgM, Serum: 48 mg/dL — ABNORMAL LOW (ref 52–322)

## 2010-09-14 LAB — LACTATE DEHYDROGENASE: LDH: 140 U/L (ref 94–250)

## 2010-09-14 LAB — KAPPA/LAMBDA LIGHT CHAINS
Kappa:Lambda Ratio: 1.75 — ABNORMAL HIGH (ref 0.26–1.65)
Lambda Free Lght Chn: 2.33 mg/dL (ref 0.57–2.63)

## 2011-01-03 ENCOUNTER — Other Ambulatory Visit: Payer: Self-pay | Admitting: Internal Medicine

## 2011-01-03 DIAGNOSIS — E041 Nontoxic single thyroid nodule: Secondary | ICD-10-CM

## 2011-01-04 ENCOUNTER — Other Ambulatory Visit: Payer: Medicare Other

## 2011-01-04 ENCOUNTER — Ambulatory Visit
Admission: RE | Admit: 2011-01-04 | Discharge: 2011-01-04 | Disposition: A | Discharge status: Home / Self Care | Payer: Medicare Other | Source: Ambulatory Visit | Attending: Internal Medicine | Admitting: Internal Medicine

## 2011-01-04 DIAGNOSIS — E041 Nontoxic single thyroid nodule: Secondary | ICD-10-CM

## 2011-01-18 DIAGNOSIS — M545 Low back pain: Secondary | ICD-10-CM | POA: Diagnosis not present

## 2011-01-18 DIAGNOSIS — M47817 Spondylosis without myelopathy or radiculopathy, lumbosacral region: Secondary | ICD-10-CM | POA: Diagnosis not present

## 2011-01-18 DIAGNOSIS — IMO0002 Reserved for concepts with insufficient information to code with codable children: Secondary | ICD-10-CM | POA: Diagnosis not present

## 2011-01-23 DIAGNOSIS — M1710 Unilateral primary osteoarthritis, unspecified knee: Secondary | ICD-10-CM | POA: Diagnosis not present

## 2011-01-27 ENCOUNTER — Telehealth: Payer: Self-pay | Admitting: Internal Medicine

## 2011-01-27 NOTE — Telephone Encounter (Signed)
[facility] of the pt's home advising her of the feb 2013 appts. Will mail the pt her appt calendar

## 2011-02-16 DIAGNOSIS — R51 Headache: Secondary | ICD-10-CM | POA: Diagnosis not present

## 2011-02-16 DIAGNOSIS — R5383 Other fatigue: Secondary | ICD-10-CM | POA: Diagnosis not present

## 2011-02-16 DIAGNOSIS — R5381 Other malaise: Secondary | ICD-10-CM | POA: Diagnosis not present

## 2011-02-16 DIAGNOSIS — E538 Deficiency of other specified B group vitamins: Secondary | ICD-10-CM | POA: Diagnosis not present

## 2011-02-16 DIAGNOSIS — D649 Anemia, unspecified: Secondary | ICD-10-CM | POA: Diagnosis not present

## 2011-02-22 DIAGNOSIS — H04129 Dry eye syndrome of unspecified lacrimal gland: Secondary | ICD-10-CM | POA: Diagnosis not present

## 2011-02-22 DIAGNOSIS — H2510 Age-related nuclear cataract, unspecified eye: Secondary | ICD-10-CM | POA: Diagnosis not present

## 2011-02-22 DIAGNOSIS — M545 Low back pain: Secondary | ICD-10-CM | POA: Diagnosis not present

## 2011-02-22 DIAGNOSIS — H1045 Other chronic allergic conjunctivitis: Secondary | ICD-10-CM | POA: Diagnosis not present

## 2011-02-22 DIAGNOSIS — M47817 Spondylosis without myelopathy or radiculopathy, lumbosacral region: Secondary | ICD-10-CM | POA: Diagnosis not present

## 2011-02-22 DIAGNOSIS — IMO0002 Reserved for concepts with insufficient information to code with codable children: Secondary | ICD-10-CM | POA: Diagnosis not present

## 2011-03-05 ENCOUNTER — Telehealth: Payer: Self-pay | Admitting: Internal Medicine

## 2011-03-05 NOTE — Telephone Encounter (Signed)
2/25 and 2/28 appts moved due to mkm to 3/8 and 3/11 pt aware  aom

## 2011-03-12 ENCOUNTER — Other Ambulatory Visit: Payer: Medicare Other | Admitting: Lab

## 2011-03-15 ENCOUNTER — Ambulatory Visit: Payer: Medicare Other | Admitting: Internal Medicine

## 2011-03-19 DIAGNOSIS — E538 Deficiency of other specified B group vitamins: Secondary | ICD-10-CM | POA: Diagnosis not present

## 2011-03-19 DIAGNOSIS — Z79899 Other long term (current) drug therapy: Secondary | ICD-10-CM | POA: Diagnosis not present

## 2011-03-20 DIAGNOSIS — Z853 Personal history of malignant neoplasm of breast: Secondary | ICD-10-CM | POA: Diagnosis not present

## 2011-03-21 ENCOUNTER — Other Ambulatory Visit: Payer: Self-pay | Admitting: Internal Medicine

## 2011-03-21 DIAGNOSIS — R1310 Dysphagia, unspecified: Secondary | ICD-10-CM

## 2011-03-23 ENCOUNTER — Other Ambulatory Visit (HOSPITAL_BASED_OUTPATIENT_CLINIC_OR_DEPARTMENT_OTHER): Payer: Medicare Other | Admitting: Lab

## 2011-03-23 ENCOUNTER — Ambulatory Visit
Admission: RE | Admit: 2011-03-23 | Discharge: 2011-03-23 | Disposition: A | Discharge status: Home / Self Care | Payer: Medicare Other | Source: Ambulatory Visit | Attending: Internal Medicine | Admitting: Internal Medicine

## 2011-03-23 DIAGNOSIS — R221 Localized swelling, mass and lump, neck: Secondary | ICD-10-CM | POA: Diagnosis not present

## 2011-03-23 DIAGNOSIS — R1310 Dysphagia, unspecified: Secondary | ICD-10-CM

## 2011-03-23 DIAGNOSIS — Z853 Personal history of malignant neoplasm of breast: Secondary | ICD-10-CM

## 2011-03-23 DIAGNOSIS — C9000 Multiple myeloma not having achieved remission: Secondary | ICD-10-CM

## 2011-03-23 DIAGNOSIS — Z9484 Stem cells transplant status: Secondary | ICD-10-CM | POA: Diagnosis not present

## 2011-03-23 DIAGNOSIS — R220 Localized swelling, mass and lump, head: Secondary | ICD-10-CM | POA: Diagnosis not present

## 2011-03-23 LAB — CBC WITH DIFFERENTIAL/PLATELET
Basophils Absolute: 0 10*3/uL (ref 0.0–0.1)
HCT: 33.9 % — ABNORMAL LOW (ref 34.8–46.6)
HGB: 11.4 g/dL — ABNORMAL LOW (ref 11.6–15.9)
LYMPH%: 35.3 % (ref 14.0–49.7)
MCH: 31.6 pg (ref 25.1–34.0)
MCHC: 33.6 g/dL (ref 31.5–36.0)
MONO#: 0.5 10*3/uL (ref 0.1–0.9)
NEUT%: 52.1 % (ref 38.4–76.8)
Platelets: 278 10*3/uL (ref 145–400)
lymph#: 1.9 10*3/uL (ref 0.9–3.3)

## 2011-03-26 ENCOUNTER — Other Ambulatory Visit: Payer: Medicare Other

## 2011-03-26 ENCOUNTER — Telehealth: Payer: Self-pay | Admitting: Internal Medicine

## 2011-03-26 ENCOUNTER — Ambulatory Visit (HOSPITAL_BASED_OUTPATIENT_CLINIC_OR_DEPARTMENT_OTHER): Payer: Medicare Other | Admitting: Internal Medicine

## 2011-03-26 VITALS — BP 111/68 | HR 84 | Temp 99.4°F | Ht 65.5 in | Wt 182.5 lb

## 2011-03-26 DIAGNOSIS — C9001 Multiple myeloma in remission: Secondary | ICD-10-CM

## 2011-03-26 NOTE — Progress Notes (Signed)
[facility] Telephone:([phone]   [phone]  OFFICE PROGRESS NOTE  PRINCIPAL DIAGNOSES:   1. Multiple myeloma diagnosed in 2002, initially with smoldering myeloma at [facility].  2. Ductal carcinoma in situ status post mastectomy with sentinel lymph node biopsy in October 2008.  PRIOR THERAPY:   1. Status post treatment with tamoxifen from November 2008 through February 2009, discontinued secondary to intolerance. 2. Status post 3 cycles of chemotherapy with Revlimid and Decadron followed by 1 cycle of Decadron only with mild response. 3. Status post 2 cycles of systemic chemotherapy with Velcade, Doxil and Decadron discontinued secondary to significant peripheral neuropathy.  Last dose was given May 2010 at [facility]. 4. Status post autologous peripheral blood stem cell transplant on October 01, 2008 at [facility] under the care of Dr. Sheryle Spray.  CURRENT THERAPY:  Observation.  INTERVAL HISTORY: Nancy Norris 71 y.o. female returns to the clinic today for routine six-month followup visit. She has no complaints today. She has significant improvement in the peripheral neuropathy after she was started on treatment with Lyrica. The patient denied having any significant weight loss or night sweats, no chest pain or shortness of breath, no bleeding issues. She had a mammogram performed recently that was unremarkable for malignancy. She also had repeat CBC, comprehensive metabolic panel, LDH and myeloma panel performed recently and she is here for evaluation and discussion of her lab results.   MEDICAL HISTORY: Past Medical History  Diagnosis Date  . Hyperkalemia   . Hypothyroidism   . COPD (chronic obstructive pulmonary disease)   . Hyponatremia   . Dizziness   . Fibromyalgia   . Breast cancer   . Multiple myeloma   . Mucositis     ALLERGIES:  is allergic to codeine; iodinated diagnostic agents; latex; and sulfa  antibiotics.  MEDICATIONS:  Current Outpatient Prescriptions  Medication Sig Dispense Refill  . amitriptyline (ELAVIL) 10 MG tablet Take 10 mg by mouth 3 (three) times daily.        Marland Kitchen buPROPion (WELLBUTRIN) 100 MG tablet Take 100 mg by mouth 2 (two) times daily.      . cetirizine (ZYRTEC) 10 MG tablet Take 10 mg by mouth daily.      . Cholecalciferol (VITAMIN D3) 3000 UNITS TABS Take 10,000 Units by mouth.      . citalopram (CELEXA) 40 MG tablet Take 40 mg by mouth daily.        . cyanocobalamin 2000 MCG tablet Take 3,000 mcg by mouth daily.      . cycloSPORINE (RESTASIS) 0.05 % ophthalmic emulsion 1 drop 2 (two) times daily.      . fludrocortisone (FLORINEF) 0.1 MG tablet Take 0.1 mg by mouth every other day.      . levothyroxine (SYNTHROID, LEVOTHROID) 150 MCG tablet Take 150 mcg by mouth daily. 225 mcg sat and sund      . methocarbamol (ROBAXIN) 500 MG tablet Take 500 mg by mouth 4 (four) times daily.      Marland Kitchen omeprazole (PRILOSEC) 40 MG capsule Take 40 mg by mouth 2 (two) times daily.       . pregabalin (LYRICA) 100 MG capsule Take 100 mg by mouth 2 (two) times daily.      Marland Kitchen senna (SENOKOT) 8.6 MG TABS Take 1 tablet by mouth.      . traMADol (ULTRAM) 50 MG tablet Take 50 mg by mouth every 6 (six) hours as needed.        Marland Kitchen  butalbital-acetaminophen-caffeine (FIORICET WITH CODEINE) 50-325-40-30 MG per capsule Take 1 capsule by mouth as needed.        . Fluticasone-Salmeterol (ADVAIR DISKUS) 100-50 MCG/DOSE AEPB Inhale 1 puff into the lungs every 12 (twelve) hours.        . Loratadine (CLARITIN) 10 MG CAPS 1 tab po qd       . naproxen sodium (ANAPROX) 220 MG tablet Take 220 mg by mouth as needed.          SURGICAL HISTORY:  Past Surgical History  Procedure Date  . History of port removal   . Status post stem cell transplant on September 28, 2008.     REVIEW OF SYSTEMS:  A comprehensive review of systems was negative.   PHYSICAL EXAMINATION: General appearance: alert, cooperative,  appears stated age and no distress Neck: no adenopathy Lymph nodes: Cervical, supraclavicular, and axillary nodes normal. Resp: clear to auscultation bilaterally Cardio: regular rate and rhythm, S1, S2 normal, no murmur, click, rub or gallop GI: soft, non-tender; bowel sounds normal; no masses,  no organomegaly Extremities: extremities normal, atraumatic, no cyanosis or edema Neurologic: Alert and oriented X 3, normal strength and tone. Normal symmetric reflexes. Normal coordination and gait  ECOG PERFORMANCE STATUS: 1 - Symptomatic but completely ambulatory  Blood pressure 111/68, pulse 84, temperature 99.4 F (37.4 C), temperature source Oral, height 5' 5.5" (1.664 m), weight 182 lb 8 oz (82.781 kg).  LABORATORY DATA: Lab Results  Component Value Date   WBC 5.5 03/23/2011   HGB 11.4* 03/23/2011   HCT 33.9* 03/23/2011   MCV 94.1 03/23/2011   PLT 278 03/23/2011      Chemistry      Component Value Date/Time   NA 141 03/23/2011 0847   K 4.7 03/23/2011 0847   CL 106 03/23/2011 0847   CO2 27 03/23/2011 0847   BUN 19 03/23/2011 0847   CREATININE 1.77* 03/23/2011 0847      Component Value Date/Time   CALCIUM 9.2 03/23/2011 0847   ALKPHOS 100 03/23/2011 0847   AST 14 03/23/2011 0847   ALT 10 03/23/2011 0847   BILITOT 0.6 03/23/2011 0847       RADIOGRAPHIC STUDIES: Ct Soft Tissue Neck Wo Contrast  03/23/2011  *RADIOLOGY REPORT*  Clinical Data: Difficulty swallowing.  Abnormal fullness. Intermittent swelling left neck.  Possible left neck mass.  History of breast cancer.  History of multiple myeloma.  CT NECK WITHOUT CONTRAST  Technique:  Multidetector CT imaging of the neck was performed without intravenous contrast.  Comparison: Thyroid ultrasound 01/04/2011.  Findings: Lung apices are clear.  No pleural or parenchymal disease.  There is atherosclerotic calcification of the aorta. Limited visualization of the intracranial contents does not show any abnormality.  Both parotid glands are normal.  Both  submandibular glands are normal.  Thyroid gland is normal.  There are no pathologically enlarged lymph nodes on either side of the neck.  Skin marker overlies the left anterior neck.  There does not appear to be any underlying lesion.  Arterial and venous structures appear unremarkable.  No mucosal or submucosal lesion is seen.  Ordinary mid cervical spondylosis is noted.  IMPRESSION: Negative examination.  Skin marker overlies normal appearing tissues.  Original Report Authenticated By: Jules Schick, M.D.    ASSESSMENT:  1) Multiple myeloma diagnosed in 2002, initially with smoldering myeloma at [facility].  2) Ductal carcinoma in situ status post mastectomy with sentinel lymph node biopsy in October 2008.  PLAN: I discussed with  the patient her myeloma panel which showed no evidence for disease progression. I recommended for her continuous observation for now with repeat CBC, comprehensive metabolic panel, LDH and myeloma panel in 6 months. She would come back for followup visit at that time. She was advised to call me immediately if she has any concerning symptoms in the interval  All questions were answered. The patient knows to call the clinic with any problems, questions or concerns. We can certainly see the patient much sooner if necessary.

## 2011-03-26 NOTE — Telephone Encounter (Signed)
Gv pt appt for sept2013 

## 2011-03-27 LAB — KAPPA/LAMBDA LIGHT CHAINS
Kappa free light chain: 1.4 mg/dL (ref 0.33–1.94)
Kappa:Lambda Ratio: 1.89 — ABNORMAL HIGH (ref 0.26–1.65)

## 2011-03-27 LAB — COMPREHENSIVE METABOLIC PANEL
BUN: 19 mg/dL (ref 6–23)
CO2: 27 mEq/L (ref 19–32)
Calcium: 9.2 mg/dL (ref 8.4–10.5)
Chloride: 106 mEq/L (ref 96–112)
Creatinine, Ser: 1.77 mg/dL — ABNORMAL HIGH (ref 0.50–1.10)
Total Bilirubin: 0.6 mg/dL (ref 0.3–1.2)

## 2011-03-27 LAB — IGG, IGA, IGM
IgA: 53 mg/dL — ABNORMAL LOW (ref 69–380)
IgG (Immunoglobin G), Serum: 899 mg/dL (ref 690–1700)
IgM, Serum: 57 mg/dL (ref 52–322)

## 2011-03-27 LAB — BETA 2 MICROGLOBULIN, SERUM: Beta-2 Microglobulin: 3.69 mg/L — ABNORMAL HIGH (ref 1.01–1.73)

## 2011-03-27 LAB — LACTATE DEHYDROGENASE: LDH: 128 U/L (ref 94–250)

## 2011-04-02 DIAGNOSIS — M797 Fibromyalgia: Secondary | ICD-10-CM | POA: Diagnosis not present

## 2011-04-02 DIAGNOSIS — R7309 Other abnormal glucose: Secondary | ICD-10-CM | POA: Diagnosis not present

## 2011-04-02 DIAGNOSIS — I951 Orthostatic hypotension: Secondary | ICD-10-CM | POA: Diagnosis not present

## 2011-04-02 DIAGNOSIS — Z79899 Other long term (current) drug therapy: Secondary | ICD-10-CM | POA: Diagnosis not present

## 2011-04-02 DIAGNOSIS — E039 Hypothyroidism, unspecified: Secondary | ICD-10-CM | POA: Diagnosis not present

## 2011-04-02 DIAGNOSIS — E559 Vitamin D deficiency, unspecified: Secondary | ICD-10-CM | POA: Diagnosis not present

## 2011-04-02 DIAGNOSIS — M790 Rheumatism, unspecified: Secondary | ICD-10-CM | POA: Diagnosis not present

## 2011-04-03 DIAGNOSIS — M5137 Other intervertebral disc degeneration, lumbosacral region: Secondary | ICD-10-CM | POA: Diagnosis not present

## 2011-04-04 ENCOUNTER — Encounter: Payer: Self-pay | Admitting: *Deleted

## 2011-04-04 NOTE — Progress Notes (Signed)
Pt is in PT and needs oncology clearance to use the TENS unit during PT.  Order faxed to [facility]:  No contraindication in oncology to use TENS unit during PT.  Order faxed to [phone].  SLJ

## 2011-04-10 DIAGNOSIS — M5137 Other intervertebral disc degeneration, lumbosacral region: Secondary | ICD-10-CM | POA: Diagnosis not present

## 2011-04-12 ENCOUNTER — Ambulatory Visit: Payer: Medicare Other | Admitting: Internal Medicine

## 2011-04-12 DIAGNOSIS — M5137 Other intervertebral disc degeneration, lumbosacral region: Secondary | ICD-10-CM | POA: Diagnosis not present

## 2011-04-19 DIAGNOSIS — M5137 Other intervertebral disc degeneration, lumbosacral region: Secondary | ICD-10-CM | POA: Diagnosis not present

## 2011-04-24 DIAGNOSIS — H04129 Dry eye syndrome of unspecified lacrimal gland: Secondary | ICD-10-CM | POA: Diagnosis not present

## 2011-04-24 DIAGNOSIS — H3530 Unspecified macular degeneration: Secondary | ICD-10-CM | POA: Diagnosis not present

## 2011-04-24 DIAGNOSIS — M5137 Other intervertebral disc degeneration, lumbosacral region: Secondary | ICD-10-CM | POA: Diagnosis not present

## 2011-04-24 DIAGNOSIS — H1045 Other chronic allergic conjunctivitis: Secondary | ICD-10-CM | POA: Diagnosis not present

## 2011-04-24 DIAGNOSIS — H2510 Age-related nuclear cataract, unspecified eye: Secondary | ICD-10-CM | POA: Diagnosis not present

## 2011-04-26 DIAGNOSIS — M5137 Other intervertebral disc degeneration, lumbosacral region: Secondary | ICD-10-CM | POA: Diagnosis not present

## 2011-04-30 DIAGNOSIS — M25549 Pain in joints of unspecified hand: Secondary | ICD-10-CM | POA: Diagnosis not present

## 2011-04-30 DIAGNOSIS — M5137 Other intervertebral disc degeneration, lumbosacral region: Secondary | ICD-10-CM | POA: Diagnosis not present

## 2011-05-02 DIAGNOSIS — M5137 Other intervertebral disc degeneration, lumbosacral region: Secondary | ICD-10-CM | POA: Diagnosis not present

## 2011-05-04 DIAGNOSIS — R209 Unspecified disturbances of skin sensation: Secondary | ICD-10-CM | POA: Diagnosis not present

## 2011-05-07 DIAGNOSIS — M5137 Other intervertebral disc degeneration, lumbosacral region: Secondary | ICD-10-CM | POA: Diagnosis not present

## 2011-05-08 DIAGNOSIS — M25549 Pain in joints of unspecified hand: Secondary | ICD-10-CM | POA: Diagnosis not present

## 2011-05-23 DIAGNOSIS — G5600 Carpal tunnel syndrome, unspecified upper limb: Secondary | ICD-10-CM | POA: Diagnosis not present

## 2011-06-20 DIAGNOSIS — E039 Hypothyroidism, unspecified: Secondary | ICD-10-CM | POA: Diagnosis not present

## 2011-06-20 DIAGNOSIS — I951 Orthostatic hypotension: Secondary | ICD-10-CM | POA: Diagnosis not present

## 2011-06-20 DIAGNOSIS — J0100 Acute maxillary sinusitis, unspecified: Secondary | ICD-10-CM | POA: Diagnosis not present

## 2011-06-20 DIAGNOSIS — R7309 Other abnormal glucose: Secondary | ICD-10-CM | POA: Diagnosis not present

## 2011-06-20 DIAGNOSIS — Z79899 Other long term (current) drug therapy: Secondary | ICD-10-CM | POA: Diagnosis not present

## 2011-06-20 DIAGNOSIS — E559 Vitamin D deficiency, unspecified: Secondary | ICD-10-CM | POA: Diagnosis not present

## 2011-06-27 DIAGNOSIS — H1045 Other chronic allergic conjunctivitis: Secondary | ICD-10-CM | POA: Diagnosis not present

## 2011-06-27 DIAGNOSIS — H04129 Dry eye syndrome of unspecified lacrimal gland: Secondary | ICD-10-CM | POA: Diagnosis not present

## 2011-06-27 DIAGNOSIS — H01009 Unspecified blepharitis unspecified eye, unspecified eyelid: Secondary | ICD-10-CM | POA: Diagnosis not present

## 2011-07-10 ENCOUNTER — Telehealth: Payer: Self-pay | Admitting: Medical Oncology

## 2011-07-10 NOTE — Telephone Encounter (Signed)
Rx given for Dr Vista Mink to sign

## 2011-07-23 DIAGNOSIS — M1710 Unilateral primary osteoarthritis, unspecified knee: Secondary | ICD-10-CM | POA: Diagnosis not present

## 2011-09-24 ENCOUNTER — Other Ambulatory Visit (HOSPITAL_BASED_OUTPATIENT_CLINIC_OR_DEPARTMENT_OTHER): Payer: Medicare Other | Admitting: Lab

## 2011-09-24 DIAGNOSIS — C9001 Multiple myeloma in remission: Secondary | ICD-10-CM

## 2011-09-24 DIAGNOSIS — C9000 Multiple myeloma not having achieved remission: Secondary | ICD-10-CM | POA: Diagnosis not present

## 2011-09-24 LAB — CBC WITH DIFFERENTIAL/PLATELET
BASO%: 0.6 % (ref 0.0–2.0)
EOS%: 4 % (ref 0.0–7.0)
HCT: 34.4 % — ABNORMAL LOW (ref 34.8–46.6)
LYMPH%: 30.9 % (ref 14.0–49.7)
MCH: 30.5 pg (ref 25.1–34.0)
MCHC: 33.5 g/dL (ref 31.5–36.0)
NEUT%: 54.9 % (ref 38.4–76.8)
Platelets: 275 10*3/uL (ref 145–400)
lymph#: 1.9 10*3/uL (ref 0.9–3.3)

## 2011-09-24 LAB — COMPREHENSIVE METABOLIC PANEL (CC13)
ALT: 14 U/L (ref 0–55)
AST: 16 U/L (ref 5–34)
Creatinine: 2 mg/dL — ABNORMAL HIGH (ref 0.6–1.1)
Total Bilirubin: 0.6 mg/dL (ref 0.20–1.20)

## 2011-09-26 ENCOUNTER — Ambulatory Visit (HOSPITAL_BASED_OUTPATIENT_CLINIC_OR_DEPARTMENT_OTHER): Payer: Medicare Other | Admitting: Internal Medicine

## 2011-09-26 ENCOUNTER — Other Ambulatory Visit: Payer: Medicare Other | Admitting: Lab

## 2011-09-26 ENCOUNTER — Telehealth: Payer: Self-pay | Admitting: *Deleted

## 2011-09-26 VITALS — BP 156/80 | HR 72 | Temp 98.5°F | Resp 18 | Ht 65.5 in | Wt 184.2 lb

## 2011-09-26 DIAGNOSIS — C9001 Multiple myeloma in remission: Secondary | ICD-10-CM

## 2011-09-26 DIAGNOSIS — Z87898 Personal history of other specified conditions: Secondary | ICD-10-CM | POA: Diagnosis not present

## 2011-09-26 NOTE — Progress Notes (Signed)
[place] [facility] [phone]   [phone]  OFFICE PROGRESS NOTE  PRINCIPAL DIAGNOSES:  1. Multiple myeloma diagnosed in 2002, initially with smoldering myeloma at [facility].  2. Ductal carcinoma in situ status post mastectomy with sentinel lymph node biopsy in October 2008.  PRIOR THERAPY:  1. Status post treatment with tamoxifen from November 2008 through February 2009, discontinued secondary to intolerance. 2. Status post 3 cycles of chemotherapy with Revlimid and Decadron followed by 1 cycle of Decadron only with mild response. 3. Status post 2 cycles of systemic chemotherapy with Velcade, Doxil and Decadron discontinued secondary to significant peripheral neuropathy. Last dose was given May 2010 at [facility] 4. Status post autologous peripheral blood stem cell transplant on October 01, 2008 at [facility] under the care of Dr. Phyllis Ginger.  CURRENT THERAPY: Observation.  INTERVAL HISTORY: DERYN ZELINSKI 71 y.o. female returns to the clinic today for routine six-month followup visit. The patient is feeling fine except for mild fatigue. She denied having any significant weight loss or night sweats. She has no chest pain, shortness breath, cough or hemoptysis. The patient has repeat CBC, comprehensive metabolic panel, LDH and myeloma panel performed recently and she is here for evaluation and discussion of her lab results.  MEDICAL HISTORY: Past Medical History  Diagnosis Date  . Hyperkalemia   . Hypothyroidism   . COPD (chronic obstructive pulmonary disease)   . Hyponatremia   . Dizziness   . Fibromyalgia   . Breast cancer   . Multiple myeloma   . Mucositis     ALLERGIES:  is allergic to codeine; iodinated diagnostic agents; latex; and sulfa antibiotics.  MEDICATIONS:  Current Outpatient Prescriptions  Medication Sig Dispense Refill  . amitriptyline (ELAVIL) 10 MG tablet Take 10 mg by mouth 3 (three) times  daily.        Marland Kitchen buPROPion (WELLBUTRIN) 100 MG tablet Take 100 mg by mouth 2 (two) times daily.      . butalbital-acetaminophen-caffeine (FIORICET WITH CODEINE) 50-325-40-30 MG per capsule Take 1 capsule by mouth as needed.        . cetirizine (ZYRTEC) 10 MG tablet Take 10 mg by mouth daily.      . Cholecalciferol (VITAMIN D3) 3000 UNITS TABS Take 10,000 Units by mouth.      . citalopram (CELEXA) 40 MG tablet Take 40 mg by mouth daily.        . cyanocobalamin 2000 MCG tablet Take 3,000 mcg by mouth daily.      . cycloSPORINE (RESTASIS) 0.05 % ophthalmic emulsion 1 drop 2 (two) times daily.      . fludrocortisone (FLORINEF) 0.1 MG tablet Take 0.1 mg by mouth every other day.      . Fluticasone-Salmeterol (ADVAIR DISKUS) 100-50 MCG/DOSE AEPB Inhale 1 puff into the lungs every 12 (twelve) hours.        Marland Kitchen levothyroxine (SYNTHROID, LEVOTHROID) 150 MCG tablet Take 150 mcg by mouth daily. 225 mcg sat and sund      . Loratadine (CLARITIN) 10 MG CAPS 1 tab po qd       . methocarbamol (ROBAXIN) 500 MG tablet Take 500 mg by mouth 4 (four) times daily.      . naproxen sodium (ANAPROX) 220 MG tablet Take 220 mg by mouth as needed.        Marland Kitchen omeprazole (PRILOSEC) 40 MG capsule Take 40 mg by mouth 2 (two) times daily.       Marland Kitchen  pregabalin (LYRICA) 100 MG capsule Take 100 mg by mouth 2 (two) times daily.      Marland Kitchen senna (SENOKOT) 8.6 MG TABS Take 1 tablet by mouth.      . traMADol (ULTRAM) 50 MG tablet Take 50 mg by mouth every 6 (six) hours as needed.          SURGICAL HISTORY:  Past Surgical History  Procedure Date  . History of port removal   . Status post stem cell transplant on September 28, 2008.     REVIEW OF SYSTEMS:  A comprehensive review of systems was negative except for: Constitutional: positive for fatigue   PHYSICAL EXAMINATION: General appearance: alert, cooperative and no distress Head: Normocephalic, without obvious abnormality, atraumatic Neck: no adenopathy Lymph nodes: Cervical,  supraclavicular, and axillary nodes normal. Resp: clear to auscultation bilaterally Cardio: regular rate and rhythm, S1, S2 normal, no murmur, click, rub or gallop GI: soft, non-tender; bowel sounds normal; no masses,  no organomegaly Extremities: extremities normal, atraumatic, no cyanosis or edema  ECOG PERFORMANCE STATUS: 1 - Symptomatic but completely ambulatory  There were no vitals taken for this visit.  LABORATORY DATA: Lab Results  Component Value Date   WBC 6.2 09/24/2011   HGB 11.5* 09/24/2011   HCT 34.4* 09/24/2011   MCV 91.2 09/24/2011   PLT 275 09/24/2011      Chemistry      Component Value Date/Time   NA 142 09/24/2011 0814   NA 141 03/23/2011 0847   K 4.0 09/24/2011 0814   K 4.7 03/23/2011 0847   CL 105 09/24/2011 0814   CL 106 03/23/2011 0847   CO2 27 09/24/2011 0814   CO2 27 03/23/2011 0847   BUN 23.0 09/24/2011 0814   BUN 19 03/23/2011 0847   CREATININE 2.0* 09/24/2011 0814   CREATININE 1.77* 03/23/2011 0847      Component Value Date/Time   CALCIUM 9.3 09/24/2011 0814   CALCIUM 9.2 03/23/2011 0847   ALKPHOS 130 09/24/2011 0814   ALKPHOS 100 03/23/2011 0847   AST 16 09/24/2011 0814   AST 14 03/23/2011 0847   ALT 14 09/24/2011 0814   ALT 10 03/23/2011 0847   BILITOT 0.60 09/24/2011 0814   BILITOT 0.6 03/23/2011 0847       RADIOGRAPHIC STUDIES: No results found.  ASSESSMENT: This is a very pleasant 71 years old white female with history of multiple myeloma currently on observation. The patient is doing fine with no significant evidence for disease progression on his recent blood work.  PLAN: I discussed the lab result with the patient. I recommended for her to continue on observation for now with repeat CBC, comprehensive to panel, LDH and myeloma panel in 6 months. For the insufficiency she would ask Dr. [name] for referral to nephrology for evaluation of her condition. She was advised to call me immediately she has any concerning symptoms in the interval.  All questions were answered. The  patient knows to call the clinic with any problems, questions or concerns. We can certainly see the patient much sooner if necessary.

## 2011-09-26 NOTE — Telephone Encounter (Signed)
Left voice message to inform the patient of the new date and time on 03-19-2012 lab only  03-26-2012 md

## 2011-09-26 NOTE — Patient Instructions (Signed)
The myeloma panel are stable. Followup in 6 months. You will need referred by your primary care physician to nephrology for evaluation of her renal insufficiency.

## 2011-09-28 LAB — SPEP & IFE WITH QIG
Alpha-1-Globulin: 4.7 % (ref 2.9–4.9)
Alpha-2-Globulin: 11.3 % (ref 7.1–11.8)
Beta Globulin: 5.8 % (ref 4.7–7.2)
IgG (Immunoglobin G), Serum: 1180 mg/dL (ref 690–1700)
M-Spike, %: 0.25 g/dL
Total Protein, Serum Electrophoresis: 6.6 g/dL (ref 6.0–8.3)

## 2011-09-28 LAB — KAPPA/LAMBDA LIGHT CHAINS: Kappa free light chain: 3.15 mg/dL — ABNORMAL HIGH (ref 0.33–1.94)

## 2011-09-28 LAB — BETA 2 MICROGLOBULIN, SERUM: Beta-2 Microglobulin: 3.51 mg/L — ABNORMAL HIGH (ref 1.01–1.73)

## 2011-10-02 DIAGNOSIS — J0100 Acute maxillary sinusitis, unspecified: Secondary | ICD-10-CM | POA: Diagnosis not present

## 2011-10-09 DIAGNOSIS — Z79899 Other long term (current) drug therapy: Secondary | ICD-10-CM | POA: Diagnosis not present

## 2011-10-09 DIAGNOSIS — E782 Mixed hyperlipidemia: Secondary | ICD-10-CM | POA: Diagnosis not present

## 2011-10-09 DIAGNOSIS — R7309 Other abnormal glucose: Secondary | ICD-10-CM | POA: Diagnosis not present

## 2011-10-09 DIAGNOSIS — E559 Vitamin D deficiency, unspecified: Secondary | ICD-10-CM | POA: Diagnosis not present

## 2011-10-09 DIAGNOSIS — E039 Hypothyroidism, unspecified: Secondary | ICD-10-CM | POA: Diagnosis not present

## 2011-10-09 DIAGNOSIS — Z23 Encounter for immunization: Secondary | ICD-10-CM | POA: Diagnosis not present

## 2011-10-09 DIAGNOSIS — Z111 Encounter for screening for respiratory tuberculosis: Secondary | ICD-10-CM | POA: Diagnosis not present

## 2011-10-09 DIAGNOSIS — I951 Orthostatic hypotension: Secondary | ICD-10-CM | POA: Diagnosis not present

## 2011-10-09 DIAGNOSIS — Z1212 Encounter for screening for malignant neoplasm of rectum: Secondary | ICD-10-CM | POA: Diagnosis not present

## 2011-10-09 DIAGNOSIS — I10 Essential (primary) hypertension: Secondary | ICD-10-CM | POA: Diagnosis not present

## 2011-10-09 DIAGNOSIS — Z8249 Family history of ischemic heart disease and other diseases of the circulatory system: Secondary | ICD-10-CM | POA: Diagnosis not present

## 2011-10-15 DIAGNOSIS — K122 Cellulitis and abscess of mouth: Secondary | ICD-10-CM | POA: Diagnosis not present

## 2011-10-26 ENCOUNTER — Telehealth: Payer: Self-pay | Admitting: *Deleted

## 2011-10-26 NOTE — Telephone Encounter (Signed)
Pt requesting replacement mastectomy product through [facility].  Rx signed by Dr Vista Mink and faxed to [phone].  SLJ

## 2011-11-06 DIAGNOSIS — H04129 Dry eye syndrome of unspecified lacrimal gland: Secondary | ICD-10-CM | POA: Diagnosis not present

## 2011-11-29 DIAGNOSIS — G43909 Migraine, unspecified, not intractable, without status migrainosus: Secondary | ICD-10-CM | POA: Diagnosis not present

## 2011-11-29 DIAGNOSIS — M79609 Pain in unspecified limb: Secondary | ICD-10-CM | POA: Diagnosis not present

## 2011-11-29 DIAGNOSIS — E039 Hypothyroidism, unspecified: Secondary | ICD-10-CM | POA: Diagnosis not present

## 2011-12-20 DIAGNOSIS — R7309 Other abnormal glucose: Secondary | ICD-10-CM | POA: Diagnosis not present

## 2011-12-20 DIAGNOSIS — Z79899 Other long term (current) drug therapy: Secondary | ICD-10-CM | POA: Diagnosis not present

## 2011-12-20 DIAGNOSIS — I10 Essential (primary) hypertension: Secondary | ICD-10-CM | POA: Diagnosis not present

## 2011-12-20 DIAGNOSIS — E782 Mixed hyperlipidemia: Secondary | ICD-10-CM | POA: Diagnosis not present

## 2011-12-20 DIAGNOSIS — E559 Vitamin D deficiency, unspecified: Secondary | ICD-10-CM | POA: Diagnosis not present

## 2012-01-14 DIAGNOSIS — M25519 Pain in unspecified shoulder: Secondary | ICD-10-CM | POA: Diagnosis not present

## 2012-01-18 ENCOUNTER — Telehealth: Payer: Self-pay | Admitting: *Deleted

## 2012-01-18 NOTE — Telephone Encounter (Signed)
Order for breast prosthetics replacements given to Dr Vista Mink to review and sign.  SLJ

## 2012-01-23 DIAGNOSIS — M47817 Spondylosis without myelopathy or radiculopathy, lumbosacral region: Secondary | ICD-10-CM | POA: Diagnosis not present

## 2012-01-23 DIAGNOSIS — IMO0002 Reserved for concepts with insufficient information to code with codable children: Secondary | ICD-10-CM | POA: Diagnosis not present

## 2012-01-23 DIAGNOSIS — M545 Low back pain: Secondary | ICD-10-CM | POA: Diagnosis not present

## 2012-01-23 DIAGNOSIS — R209 Unspecified disturbances of skin sensation: Secondary | ICD-10-CM | POA: Diagnosis not present

## 2012-01-28 DIAGNOSIS — IMO0002 Reserved for concepts with insufficient information to code with codable children: Secondary | ICD-10-CM | POA: Diagnosis not present

## 2012-01-28 DIAGNOSIS — M47817 Spondylosis without myelopathy or radiculopathy, lumbosacral region: Secondary | ICD-10-CM | POA: Diagnosis not present

## 2012-03-06 DIAGNOSIS — M459 Ankylosing spondylitis of unspecified sites in spine: Secondary | ICD-10-CM | POA: Diagnosis not present

## 2012-03-19 ENCOUNTER — Other Ambulatory Visit (HOSPITAL_BASED_OUTPATIENT_CLINIC_OR_DEPARTMENT_OTHER): Payer: Medicare Other | Admitting: Lab

## 2012-03-19 DIAGNOSIS — C9001 Multiple myeloma in remission: Secondary | ICD-10-CM | POA: Diagnosis not present

## 2012-03-19 DIAGNOSIS — C9000 Multiple myeloma not having achieved remission: Secondary | ICD-10-CM

## 2012-03-19 LAB — COMPREHENSIVE METABOLIC PANEL (CC13)
AST: 20 U/L (ref 5–34)
BUN: 26.8 mg/dL — ABNORMAL HIGH (ref 7.0–26.0)
Calcium: 9.6 mg/dL (ref 8.4–10.4)
Chloride: 105 mEq/L (ref 98–107)
Creatinine: 2 mg/dL — ABNORMAL HIGH (ref 0.6–1.1)

## 2012-03-19 LAB — CBC WITH DIFFERENTIAL/PLATELET
Basophils Absolute: 0.1 10*3/uL (ref 0.0–0.1)
EOS%: 2 % (ref 0.0–7.0)
HCT: 35 % (ref 34.8–46.6)
HGB: 11.8 g/dL (ref 11.6–15.9)
MCH: 31.2 pg (ref 25.1–34.0)
MCV: 92.5 fL (ref 79.5–101.0)
MONO%: 9.2 % (ref 0.0–14.0)
NEUT%: 55.9 % (ref 38.4–76.8)
Platelets: 281 10*3/uL (ref 145–400)

## 2012-03-20 ENCOUNTER — Telehealth: Payer: Self-pay | Admitting: *Deleted

## 2012-03-20 DIAGNOSIS — Z853 Personal history of malignant neoplasm of breast: Secondary | ICD-10-CM | POA: Diagnosis not present

## 2012-03-20 DIAGNOSIS — H04129 Dry eye syndrome of unspecified lacrimal gland: Secondary | ICD-10-CM | POA: Diagnosis not present

## 2012-03-20 DIAGNOSIS — Z9010 Acquired absence of unspecified breast and nipple: Secondary | ICD-10-CM | POA: Diagnosis not present

## 2012-03-20 DIAGNOSIS — H1045 Other chronic allergic conjunctivitis: Secondary | ICD-10-CM | POA: Diagnosis not present

## 2012-03-20 LAB — IGG, IGA, IGM: IgA: 83 mg/dL (ref 69–380)

## 2012-03-20 LAB — KAPPA/LAMBDA LIGHT CHAINS
Kappa free light chain: 3.94 mg/dL — ABNORMAL HIGH (ref 0.33–1.94)
Lambda Free Lght Chn: 1.17 mg/dL (ref 0.57–2.63)

## 2012-03-20 NOTE — Telephone Encounter (Signed)
Mammogram report dated 03/20/12 given to Dr Vista Mink to review.  SLJ

## 2012-03-24 ENCOUNTER — Telehealth: Payer: Self-pay | Admitting: Medical Oncology

## 2012-03-24 DIAGNOSIS — H1045 Other chronic allergic conjunctivitis: Secondary | ICD-10-CM | POA: Diagnosis not present

## 2012-03-24 NOTE — Telephone Encounter (Signed)
Faxed and confirmed order faxed to [facility] for mammogram

## 2012-03-26 ENCOUNTER — Telehealth: Payer: Self-pay | Admitting: Internal Medicine

## 2012-03-26 ENCOUNTER — Encounter: Payer: Self-pay | Admitting: Internal Medicine

## 2012-03-26 ENCOUNTER — Ambulatory Visit (HOSPITAL_BASED_OUTPATIENT_CLINIC_OR_DEPARTMENT_OTHER): Payer: Medicare Other | Admitting: Internal Medicine

## 2012-03-26 VITALS — BP 128/81 | HR 85 | Temp 100.1°F | Resp 20 | Ht 65.5 in | Wt 185.6 lb

## 2012-03-26 DIAGNOSIS — C9001 Multiple myeloma in remission: Secondary | ICD-10-CM

## 2012-03-26 DIAGNOSIS — Z87898 Personal history of other specified conditions: Secondary | ICD-10-CM | POA: Diagnosis not present

## 2012-03-26 DIAGNOSIS — C9000 Multiple myeloma not having achieved remission: Secondary | ICD-10-CM

## 2012-03-26 NOTE — Patient Instructions (Addendum)
Your labwork is stable today. Followup visit in 6 months with repeat myeloma panel.

## 2012-03-26 NOTE — Progress Notes (Signed)
[place] Telephone:([phone]   Fax:(336) [phone]  OFFICE PROGRESS NOTE  PRINCIPAL DIAGNOSES:  1. Multiple myeloma diagnosed in 2002, initially with smoldering myeloma at [facility].  2. Ductal carcinoma in situ status post mastectomy with sentinel lymph node biopsy in October 2008.  PRIOR THERAPY:  1. Status post treatment with tamoxifen from November 2008 through February 2009, discontinued secondary to intolerance. 2. Status post 3 cycles of chemotherapy with Revlimid and Decadron followed by 1 cycle of Decadron only with mild response. 3. Status post 2 cycles of systemic chemotherapy with Velcade, Doxil and Decadron discontinued secondary to significant peripheral neuropathy. Last dose was given May 2010 at [facility]. 4. Status post autologous peripheral blood stem cell transplant on October 01, 2008 at [facility] under the care of Dr. Phyllis Ginger.  CURRENT THERAPY: Observation.  INTERVAL HISTORY: DEMEKA Norris 72 y.o. female returns to the clinic today for six-month followup visit. The patient is feeling fine today with no specific complaints. She denied having any significant weight loss or night sweats. She has no chest pain, shortness breath, cough or hemoptysis. She denied having any significant fatigue or weakness. The patient had repeat myeloma panel performed recently and she is here for evaluation and discussion of her lab results.   MEDICAL HISTORY: Past Medical History  Diagnosis Date  . Hyperkalemia   . Hypothyroidism   . COPD (chronic obstructive pulmonary disease)   . Hyponatremia   . Dizziness   . Fibromyalgia   . Breast cancer   . Multiple myeloma(203.0)   . Mucositis     ALLERGIES:  is allergic to codeine; iodinated diagnostic agents; latex; and sulfa antibiotics.  MEDICATIONS:  Current Outpatient Prescriptions  Medication Sig Dispense Refill  . albuterol (PROVENTIL HFA;VENTOLIN HFA) 108 (90  BASE) MCG/ACT inhaler Inhale 2 puffs into the lungs 2 (two) times daily.      Marland Kitchen amitriptyline (ELAVIL) 10 MG tablet Take 10 mg by mouth at bedtime. take 2 tablets = 20 mg hs      . buPROPion (WELLBUTRIN) 100 MG tablet Take 100 mg by mouth daily.       . cetirizine (ZYRTEC) 10 MG tablet Take 10 mg by mouth at bedtime.       . Cholecalciferol (VITAMIN D3) 3000 UNITS TABS Take 10,000 Units by mouth every other day.       . citalopram (CELEXA) 40 MG tablet Take 40 mg by mouth daily.        . cromolyn (OPTICROM) 4 % ophthalmic solution Place 1 drop into both eyes 4 (four) times daily.      . cyanocobalamin 2000 MCG tablet Take 3,000 mcg by mouth daily.      . cycloSPORINE (RESTASIS) 0.05 % ophthalmic emulsion 1 drop 2 (two) times daily.      . fludrocortisone (FLORINEF) 0.1 MG tablet Take 0.1 mg by mouth every other day.      . Fluticasone-Salmeterol (ADVAIR DISKUS) 100-50 MCG/DOSE AEPB Inhale 1 puff into the lungs every 12 (twelve) hours.        Marland Kitchen levothyroxine (SYNTHROID, LEVOTHROID) 150 MCG tablet Take 200 mcg by mouth daily. 225 mcg sat and sund      . loteprednol (LOTEMAX) 0.2 % SUSP 1 drop 3 (three) times daily. Both eyes      . omeprazole (PRILOSEC) 40 MG capsule Take 40 mg by mouth 2 (two) times daily.       . traMADol (ULTRAM) 50  MG tablet Take 50 mg by mouth every 6 (six) hours as needed.        . Loratadine (CLARITIN) 10 MG CAPS 1 tab po qd        No current facility-administered medications for this visit.    SURGICAL HISTORY:  Past Surgical History  Procedure Laterality Date  . History of port removal    . Status post stem cell transplant on September 28, 2008.      REVIEW OF SYSTEMS:  A comprehensive review of systems was negative.   PHYSICAL EXAMINATION: General appearance: alert, cooperative and no distress Head: Normocephalic, without obvious abnormality, atraumatic Neck: no adenopathy Resp: clear to auscultation bilaterally Cardio: regular rate and rhythm, S1, S2 normal,  no murmur, click, rub or gallop GI: soft, non-tender; bowel sounds normal; no masses,  no organomegaly Extremities: extremities normal, atraumatic, no cyanosis or edema  ECOG PERFORMANCE STATUS: 1 - Symptomatic but completely ambulatory  Blood pressure 128/81, pulse 85, temperature 100.1 F (37.8 C), temperature source Oral, resp. rate 20, height 5' 5.5" (1.664 m), weight 185 lb 9.6 oz (84.188 kg).  LABORATORY DATA: Lab Results  Component Value Date   WBC 8.3 03/19/2012   HGB 11.8 03/19/2012   HCT 35.0 03/19/2012   MCV 92.5 03/19/2012   PLT 281 03/19/2012      Chemistry      Component Value Date/Time   NA 140 03/19/2012 0811   NA 141 03/23/2011 0847   K 3.7 03/19/2012 0811   K 4.7 03/23/2011 0847   CL 105 03/19/2012 0811   CL 106 03/23/2011 0847   CO2 25 03/19/2012 0811   CO2 27 03/23/2011 0847   BUN 26.8* 03/19/2012 0811   BUN 19 03/23/2011 0847   CREATININE 2.0* 03/19/2012 0811   CREATININE 1.77* 03/23/2011 0847      Component Value Date/Time   CALCIUM 9.6 03/19/2012 0811   CALCIUM 9.2 03/23/2011 0847   ALKPHOS 160* 03/19/2012 0811   ALKPHOS 100 03/23/2011 0847   AST 20 03/19/2012 0811   AST 14 03/23/2011 0847   ALT 25 03/19/2012 0811   ALT 10 03/23/2011 0847   BILITOT 0.52 03/19/2012 0811   BILITOT 0.6 03/23/2011 0847       RADIOGRAPHIC STUDIES: No results found.  ASSESSMENT: This is a very pleasant 72 years old white female with history of multiple myeloma currently on observation as well as history of ductal carcinoma in situ.   PLAN: The patient is doing fine and she has no evidence for disease progression on his recent myeloma panel. I discussed the lab result with the patient and recommended for her to continue on observation for now. I would see her back for followup visit in 6 months with repeat myeloma panel. She was advised to call immediately she has any concerning symptoms in the interval. Her last mammogram performed 2 weeks ago was unremarkable.  All questions were answered. The patient knows  to call the clinic with any problems, questions or concerns. We can certainly see the patient much sooner if necessary.

## 2012-04-07 DIAGNOSIS — E538 Deficiency of other specified B group vitamins: Secondary | ICD-10-CM | POA: Diagnosis not present

## 2012-04-07 DIAGNOSIS — E782 Mixed hyperlipidemia: Secondary | ICD-10-CM | POA: Diagnosis not present

## 2012-04-07 DIAGNOSIS — R7309 Other abnormal glucose: Secondary | ICD-10-CM | POA: Diagnosis not present

## 2012-04-07 DIAGNOSIS — I10 Essential (primary) hypertension: Secondary | ICD-10-CM | POA: Diagnosis not present

## 2012-04-07 DIAGNOSIS — E559 Vitamin D deficiency, unspecified: Secondary | ICD-10-CM | POA: Diagnosis not present

## 2012-04-07 DIAGNOSIS — Z79899 Other long term (current) drug therapy: Secondary | ICD-10-CM | POA: Diagnosis not present

## 2012-04-07 DIAGNOSIS — D649 Anemia, unspecified: Secondary | ICD-10-CM | POA: Diagnosis not present

## 2012-04-08 ENCOUNTER — Encounter: Payer: Self-pay | Admitting: Internal Medicine

## 2012-04-11 ENCOUNTER — Other Ambulatory Visit: Payer: Self-pay | Admitting: Physical Medicine and Rehabilitation

## 2012-04-11 DIAGNOSIS — M545 Low back pain: Secondary | ICD-10-CM

## 2012-04-12 ENCOUNTER — Ambulatory Visit
Admission: RE | Admit: 2012-04-12 | Discharge: 2012-04-12 | Disposition: A | Discharge status: Home / Self Care | Payer: Medicare Other | Source: Ambulatory Visit | Attending: Physical Medicine and Rehabilitation | Admitting: Physical Medicine and Rehabilitation

## 2012-04-12 DIAGNOSIS — M5126 Other intervertebral disc displacement, lumbar region: Secondary | ICD-10-CM | POA: Diagnosis not present

## 2012-04-12 DIAGNOSIS — M4310 Spondylolisthesis, site unspecified: Secondary | ICD-10-CM | POA: Diagnosis not present

## 2012-04-12 DIAGNOSIS — M545 Low back pain: Secondary | ICD-10-CM

## 2012-04-16 DIAGNOSIS — H1045 Other chronic allergic conjunctivitis: Secondary | ICD-10-CM | POA: Diagnosis not present

## 2012-04-16 DIAGNOSIS — H2510 Age-related nuclear cataract, unspecified eye: Secondary | ICD-10-CM | POA: Diagnosis not present

## 2012-04-22 DIAGNOSIS — L909 Atrophic disorder of skin, unspecified: Secondary | ICD-10-CM | POA: Diagnosis not present

## 2012-04-22 DIAGNOSIS — L989 Disorder of the skin and subcutaneous tissue, unspecified: Secondary | ICD-10-CM | POA: Diagnosis not present

## 2012-04-22 DIAGNOSIS — L259 Unspecified contact dermatitis, unspecified cause: Secondary | ICD-10-CM | POA: Diagnosis not present

## 2012-04-22 DIAGNOSIS — L919 Hypertrophic disorder of the skin, unspecified: Secondary | ICD-10-CM | POA: Diagnosis not present

## 2012-04-24 DIAGNOSIS — IMO0002 Reserved for concepts with insufficient information to code with codable children: Secondary | ICD-10-CM | POA: Diagnosis not present

## 2012-04-24 DIAGNOSIS — M47817 Spondylosis without myelopathy or radiculopathy, lumbosacral region: Secondary | ICD-10-CM | POA: Diagnosis not present

## 2012-04-24 DIAGNOSIS — M461 Sacroiliitis, not elsewhere classified: Secondary | ICD-10-CM | POA: Diagnosis not present

## 2012-04-24 DIAGNOSIS — M545 Low back pain: Secondary | ICD-10-CM | POA: Diagnosis not present

## 2012-04-24 DIAGNOSIS — H2510 Age-related nuclear cataract, unspecified eye: Secondary | ICD-10-CM | POA: Diagnosis not present

## 2012-04-28 DIAGNOSIS — H269 Unspecified cataract: Secondary | ICD-10-CM | POA: Diagnosis not present

## 2012-04-28 DIAGNOSIS — H2510 Age-related nuclear cataract, unspecified eye: Secondary | ICD-10-CM | POA: Diagnosis not present

## 2012-05-01 DIAGNOSIS — M47817 Spondylosis without myelopathy or radiculopathy, lumbosacral region: Secondary | ICD-10-CM | POA: Diagnosis not present

## 2012-05-01 DIAGNOSIS — M545 Low back pain: Secondary | ICD-10-CM | POA: Diagnosis not present

## 2012-05-01 DIAGNOSIS — IMO0002 Reserved for concepts with insufficient information to code with codable children: Secondary | ICD-10-CM | POA: Diagnosis not present

## 2012-05-05 DIAGNOSIS — H1045 Other chronic allergic conjunctivitis: Secondary | ICD-10-CM | POA: Diagnosis not present

## 2012-05-05 DIAGNOSIS — H269 Unspecified cataract: Secondary | ICD-10-CM | POA: Diagnosis not present

## 2012-05-05 DIAGNOSIS — H2510 Age-related nuclear cataract, unspecified eye: Secondary | ICD-10-CM | POA: Diagnosis not present

## 2012-05-06 DIAGNOSIS — H04129 Dry eye syndrome of unspecified lacrimal gland: Secondary | ICD-10-CM | POA: Diagnosis not present

## 2012-05-06 DIAGNOSIS — H3530 Unspecified macular degeneration: Secondary | ICD-10-CM | POA: Diagnosis not present

## 2012-05-07 DIAGNOSIS — IMO0002 Reserved for concepts with insufficient information to code with codable children: Secondary | ICD-10-CM | POA: Diagnosis not present

## 2012-05-07 DIAGNOSIS — M545 Low back pain: Secondary | ICD-10-CM | POA: Diagnosis not present

## 2012-05-12 DIAGNOSIS — IMO0002 Reserved for concepts with insufficient information to code with codable children: Secondary | ICD-10-CM | POA: Diagnosis not present

## 2012-05-12 DIAGNOSIS — M545 Low back pain: Secondary | ICD-10-CM | POA: Diagnosis not present

## 2012-05-16 DIAGNOSIS — IMO0002 Reserved for concepts with insufficient information to code with codable children: Secondary | ICD-10-CM | POA: Diagnosis not present

## 2012-05-16 DIAGNOSIS — M545 Low back pain: Secondary | ICD-10-CM | POA: Diagnosis not present

## 2012-05-20 DIAGNOSIS — M545 Low back pain: Secondary | ICD-10-CM | POA: Diagnosis not present

## 2012-05-20 DIAGNOSIS — IMO0002 Reserved for concepts with insufficient information to code with codable children: Secondary | ICD-10-CM | POA: Diagnosis not present

## 2012-05-22 DIAGNOSIS — IMO0002 Reserved for concepts with insufficient information to code with codable children: Secondary | ICD-10-CM | POA: Diagnosis not present

## 2012-05-22 DIAGNOSIS — M545 Low back pain: Secondary | ICD-10-CM | POA: Diagnosis not present

## 2012-05-27 ENCOUNTER — Telehealth: Payer: Self-pay | Admitting: *Deleted

## 2012-05-27 NOTE — Telephone Encounter (Signed)
Order to [employer] for replacement prosthetic faxed to [phone]. SLJ

## 2012-06-11 DIAGNOSIS — D649 Anemia, unspecified: Secondary | ICD-10-CM | POA: Diagnosis not present

## 2012-06-11 DIAGNOSIS — L0201 Cutaneous abscess of face: Secondary | ICD-10-CM | POA: Diagnosis not present

## 2012-06-11 DIAGNOSIS — E538 Deficiency of other specified B group vitamins: Secondary | ICD-10-CM | POA: Diagnosis not present

## 2012-06-11 DIAGNOSIS — J309 Allergic rhinitis, unspecified: Secondary | ICD-10-CM | POA: Diagnosis not present

## 2012-06-11 DIAGNOSIS — I10 Essential (primary) hypertension: Secondary | ICD-10-CM | POA: Diagnosis not present

## 2012-06-25 DIAGNOSIS — Z1212 Encounter for screening for malignant neoplasm of rectum: Secondary | ICD-10-CM | POA: Diagnosis not present

## 2012-07-11 DIAGNOSIS — J301 Allergic rhinitis due to pollen: Secondary | ICD-10-CM | POA: Diagnosis not present

## 2012-07-11 DIAGNOSIS — Z79899 Other long term (current) drug therapy: Secondary | ICD-10-CM | POA: Diagnosis not present

## 2012-07-11 DIAGNOSIS — I10 Essential (primary) hypertension: Secondary | ICD-10-CM | POA: Diagnosis not present

## 2012-09-16 ENCOUNTER — Telehealth: Payer: Self-pay | Admitting: Internal Medicine

## 2012-09-16 NOTE — Telephone Encounter (Signed)
pt called to cx appt she does not want to be reminded every 32mths that she has cancer she did not want to r/s

## 2012-09-18 ENCOUNTER — Encounter: Payer: Self-pay | Admitting: Medical Oncology

## 2012-09-18 NOTE — Progress Notes (Signed)
I  mailed product order form signed by Dr Julien Nordmann to pt for post surgical bra,prosthesis, etc.

## 2012-09-22 ENCOUNTER — Other Ambulatory Visit: Payer: Medicare Other

## 2012-09-25 ENCOUNTER — Ambulatory Visit: Payer: Medicare Other | Admitting: Internal Medicine

## 2012-11-10 DIAGNOSIS — Z23 Encounter for immunization: Secondary | ICD-10-CM | POA: Diagnosis not present

## 2012-11-25 ENCOUNTER — Telehealth: Payer: Self-pay | Admitting: Internal Medicine

## 2012-11-25 NOTE — Telephone Encounter (Signed)
Faxed pt medical records to [facility]

## 2012-11-30 ENCOUNTER — Encounter: Payer: Self-pay | Admitting: Internal Medicine

## 2012-12-01 ENCOUNTER — Encounter: Payer: Self-pay | Admitting: Physician Assistant

## 2012-12-01 ENCOUNTER — Ambulatory Visit: Payer: Self-pay | Admitting: Physician Assistant

## 2012-12-01 VITALS — BP 122/68 | HR 72 | Temp 98.1°F | Resp 16 | Wt 187.0 lb

## 2012-12-01 DIAGNOSIS — M26609 Unspecified temporomandibular joint disorder, unspecified side: Secondary | ICD-10-CM

## 2012-12-01 DIAGNOSIS — R12 Heartburn: Secondary | ICD-10-CM

## 2012-12-01 DIAGNOSIS — R1013 Epigastric pain: Secondary | ICD-10-CM

## 2012-12-01 DIAGNOSIS — N3000 Acute cystitis without hematuria: Secondary | ICD-10-CM

## 2012-12-01 MED ORDER — ESOMEPRAZOLE MAGNESIUM 40 MG PO CPDR: 40.0000 mg | DELAYED_RELEASE_CAPSULE | Freq: Every day | ORAL | Status: DC

## 2012-12-01 MED ORDER — FLUCONAZOLE 150 MG PO TABS: 150.0000 mg | ORAL_TABLET | Freq: Every day | ORAL | Status: DC

## 2012-12-01 MED ORDER — METOCLOPRAMIDE HCL 10 MG PO TABS: 10.0000 mg | ORAL_TABLET | Freq: Three times a day (TID) | ORAL | Status: DC

## 2012-12-01 MED ORDER — BACLOFEN 10 MG PO TABS: 10.0000 mg | ORAL_TABLET | Freq: Every day | ORAL | Status: DC

## 2012-12-01 MED ORDER — CIPROFLOXACIN HCL 500 MG PO TABS: 500.0000 mg | ORAL_TABLET | Freq: Two times a day (BID) | ORAL | Status: DC

## 2012-12-01 NOTE — Progress Notes (Signed)
Subjective:    Patient ID: Nancy Norris, female    DOB: 1940-08-18, 72 y.o.   MRN: [id number]  Dysuria  This is a new problem. The current episode started 1 to 4 weeks ago. The problem occurs every urination. The problem has been gradually worsening. The quality of the pain is described as burning. There has been no fever. The fever has been present for 5 days or more. She is not sexually active. There is no history of pyelonephritis. Associated symptoms include chills, frequency, nausea and urgency. Pertinent negatives include no discharge, flank pain, hematuria, possible pregnancy or vomiting. She has tried increased fluids for the symptoms. The treatment provided no relief.  Sinusitis This is a new problem. The current episode started in the past 7 days. The problem has been gradually worsening since onset. Associated symptoms include chills, headaches (right eye and sinus pain) and sinus pressure. Pertinent negatives include no congestion, coughing, shortness of breath, sneezing or sore throat. Past treatments include nothing. The treatment provided no relief.     Current Outpatient Prescriptions on File Prior to Visit  Medication Sig Dispense Refill  . albuterol (PROVENTIL HFA;VENTOLIN HFA) 108 (90 BASE) MCG/ACT inhaler Inhale 2 puffs into the lungs 2 (two) times daily.      . cetirizine (ZYRTEC) 10 MG tablet Take 10 mg by mouth at bedtime.       . Cholecalciferol (VITAMIN D3) 3000 UNITS TABS Take 10,000 Units by mouth every other day.       . citalopram (CELEXA) 40 MG tablet Take 40 mg by mouth daily.        . cromolyn (OPTICROM) 4 % ophthalmic solution Place 1 drop into both eyes 4 (four) times daily.      . cyanocobalamin 2000 MCG tablet Take 3,000 mcg by mouth daily.      . fludrocortisone (FLORINEF) 0.1 MG tablet Take 0.1 mg by mouth every other day.      . levothyroxine (SYNTHROID, LEVOTHROID) 150 MCG tablet Take 200 mcg by mouth daily. 225 mcg sat and sund      . loteprednol  (LOTEMAX) 0.2 % SUSP 1 drop 3 (three) times daily. Both eyes      . omeprazole (PRILOSEC) 40 MG capsule Take 40 mg by mouth 2 (two) times daily.       . traMADol (ULTRAM) 50 MG tablet Take 50 mg by mouth every 6 (six) hours as needed.         No current facility-administered medications on file prior to visit.   Past Medical History  Diagnosis Date  . Hyperkalemia   . Hypothyroidism   . COPD (chronic obstructive pulmonary disease)   . Hyponatremia   . Dizziness   . Fibromyalgia   . Breast cancer   . Multiple myeloma   . Mucositis     Review of Systems  Constitutional: Positive for chills and fatigue (times months).  HENT: Positive for sinus pressure. Negative for congestion, sneezing and sore throat.   Respiratory: Negative for cough and shortness of breath.   Gastrointestinal: Positive for nausea, abdominal pain (epigastric pain for 1-2 months) and constipation. Negative for vomiting.       + GERD and dysphagia- taking carafate 1 g 4 times a day  Genitourinary: Positive for dysuria, urgency and frequency. Negative for hematuria and flank pain.  Neurological: Positive for headaches (right eye and sinus pain).       Objective:   Physical Exam  Constitutional: She is oriented to person, place,  and time. She appears well-developed and well-nourished.  HENT:  Right Ear: Hearing, tympanic membrane and external ear normal.  Left Ear: Hearing, tympanic membrane and external ear normal.  Mouth/Throat: Uvula is midline, oropharynx is clear and moist and mucous membranes are normal. No oropharyngeal exudate, posterior oropharyngeal edema or posterior oropharyngeal erythema.  + TMJ tenderness  Eyes: Conjunctivae are normal. Pupils are equal, round, and reactive to light.  Neck: Normal range of motion. Neck supple.  Cardiovascular: Normal rate, regular rhythm and normal heart sounds.   Pulmonary/Chest: Effort normal and breath sounds normal.  Abdominal: Soft. Bowel sounds are normal.  She exhibits no mass. There is tenderness (LLQ). There is no rebound and no guarding.  Musculoskeletal: She exhibits tenderness (right neck).  Neurological: She is alert and oriented to person, place, and time.  Skin: Skin is warm and dry. She is not diaphoretic.       Assessment & Plan:  1. TMJ (temporomandibular joint disorder) - baclofen (LIORESAL) 10 MG tablet; Take 1 tablet (10 mg total) by mouth daily.  Dispense: 60 tablet; Refill: 1 Given info on TMJ  2. Acute cystitis Diflucan 150 #1 - Urinalysis with microscopic - Urine culture - ciprofloxacin (CIPRO) 500 MG tablet; Take 1 tablet (500 mg total) by mouth 2 (two) times daily.  Dispense: 14 tablet; Refill: 0  3. Abdominal pain, epigastric Suggest following up with [facility] for EGD - H. pylori antibody, IgG - Amylase - Basic metabolic panel - Hepatic function panel  4. Heartburn - H. pylori antibody, IgG - esomeprazole (NEXIUM) 40 MG capsule; Take 1 capsule (40 mg total) by mouth daily before breakfast. 30 minutes before meal  Dispense: 30 capsule; Refill: 2 - metoCLOPramide (REGLAN) 10 MG tablet; Take 1 tablet (10 mg total) by mouth 4 (four) times daily -  before meals and at bedtime.  Dispense: 60 tablet; Refill: 2

## 2012-12-01 NOTE — Patient Instructions (Signed)
What is the TMJ? The temporomandibular (tem-PUH-ro-man-DIB-yoo-ler) joint, or the TMJ, connects the upper and lower jawbones. This joint allows the jaw to open wide and move back and forth when you chew, talk, or yawn.There are also several muscles that help this joint move. There can be muscle tightness and pain in the muscle that can cause several symptoms.  What causes TMJ pain? There are many causes of TMJ pain. Repeated chewing (for example, chewing gum) and clenching your teeth can cause pain in the joint. Some TMJ pain has no obvious cause. What can I do to ease the pain? There are many things you can do to help your pain get better. When you have pain:  Eat soft foods and stay away from chewy foods (for example, taffy) Try to use both sides of your mouth to chew Don't chew gum Don't open your mouth wide (for example, during yawning or singing) Don't bite your cheeks or fingernails Lower your amount of stress and worry Applying a warm, damp washcloth to the joint may help. Over-the-counter pain medicines such as ibuprofen (one brand: Advil) or acetaminophen (one brand: Tylenol) might also help. Do not use these medicines if you are allergic to them or if your doctor told you not to use them. How can I stop the pain from coming back? When your pain is better, you can do these exercises to make your muscles stronger and to keep the pain from coming back:  Resisted mouth opening: Place your thumb or two fingers under your chin and open your mouth slowly, pushing up lightly on your chin with your thumb. Hold for three to six seconds. Close your mouth slowly. Resisted mouth closing: Place your thumbs under your chin and your two index fingers on the ridge between your mouth and the bottom of your chin. Push down lightly on your chin as you close your mouth. Tongue up: Slowly open and close your mouth while keeping the tongue touching the roof of the mouth. Side-to-side jaw movement: Place an  object about one fourth of an inch thick (for example, two tongue depressors) between your front teeth. Slowly move your jaw from side to side. Increase the thickness of the object as the exercise becomes easier Forward jaw movement: Place an object about one fourth of an inch thick between your front teeth and move the bottom jaw forward so that the bottom teeth are in front of the top teeth. Increase the thickness of the object as the exercise becomes easier. These exercises should not be painful. If it hurts to do these exercises, stop doing them and talk to your family doctor.   Diet for Gastroesophageal Reflux Disease, Adult Reflux (acid reflux) is when acid from your stomach flows up into the esophagus. When acid comes in contact with the esophagus, the acid causes irritation and soreness (inflammation) in the esophagus. When reflux happens often or so severely that it causes damage to the esophagus, it is called gastroesophageal reflux disease (GERD). Nutrition therapy can help ease the discomfort of GERD. FOODS OR DRINKS TO AVOID OR LIMIT  Smoking or chewing tobacco. Nicotine is one of the most potent stimulants to acid production in the gastrointestinal tract.  Caffeinated and decaffeinated coffee and black tea.  Regular or low-calorie carbonated beverages or energy drinks (caffeine-free carbonated beverages are allowed).   Strong spices, such as black pepper, white pepper, red pepper, cayenne, curry powder, and chili powder.  Peppermint or spearmint.  Chocolate.  High-fat foods, including meats  and fried foods. Extra added fats including oils, butter, salad dressings, and nuts. Limit these to less than 8 tsp per day.  Fruits and vegetables if they are not tolerated, such as citrus fruits or tomatoes.  Alcohol.  Any food that seems to aggravate your condition. If you have questions regarding your diet, call your caregiver or a registered dietitian. OTHER THINGS THAT MAY HELP  GERD INCLUDE:   Eating your meals slowly, in a relaxed setting.  Eating 5 to 6 small meals per day instead of 3 large meals.  Eliminating food for a period of time if it causes distress.  Not lying down until 3 hours after eating a meal.  Keeping the head of your bed raised 6 to 9 inches (15 to 23 cm) by using a foam wedge or blocks under the legs of the bed. Lying flat may make symptoms worse.  Being physically active. Weight loss may be helpful in reducing reflux in overweight or obese adults.  Wear loose fitting clothing EXAMPLE MEAL PLAN This meal plan is approximately 2,000 calories based on [email or web address] meal planning guidelines. Breakfast   cup cooked oatmeal.  1 cup strawberries.  1 cup low-fat milk.  1 oz almonds. Snack  1 cup cucumber slices.  6 oz yogurt (made from low-fat or fat-free milk). Lunch  2 slice whole-wheat bread.  2 oz sliced [place].  2 tsp mayonnaise.  1 cup blueberries.  1 cup snap peas. Snack  6 whole-wheat crackers.  1 oz string cheese. Dinner   cup brown rice.  1 cup mixed veggies.  1 tsp olive oil.  3 oz grilled fish. Document Released: 01/01/2005 Document Revised: 03/26/2011 Document Reviewed: 11/17/2010 [facility] Patient Information 2014 [place], [place].

## 2012-12-02 ENCOUNTER — Encounter (HOSPITAL_COMMUNITY): Payer: Self-pay | Admitting: Emergency Medicine

## 2012-12-02 ENCOUNTER — Emergency Department (HOSPITAL_COMMUNITY): Payer: Medicare Other

## 2012-12-02 ENCOUNTER — Telehealth: Payer: Self-pay | Admitting: Physician Assistant

## 2012-12-02 ENCOUNTER — Inpatient Hospital Stay (HOSPITAL_COMMUNITY)
Admission: EM | Admit: 2012-12-02 | Discharge: 2012-12-06 | DRG: 683 | Disposition: A | Discharge status: Home / Self Care | Payer: Medicare Other | Attending: Internal Medicine | Admitting: Internal Medicine

## 2012-12-02 DIAGNOSIS — Z79899 Other long term (current) drug therapy: Secondary | ICD-10-CM | POA: Diagnosis not present

## 2012-12-02 DIAGNOSIS — Z825 Family history of asthma and other chronic lower respiratory diseases: Secondary | ICD-10-CM

## 2012-12-02 DIAGNOSIS — Z87898 Personal history of other specified conditions: Secondary | ICD-10-CM | POA: Diagnosis not present

## 2012-12-02 DIAGNOSIS — C9001 Multiple myeloma in remission: Secondary | ICD-10-CM | POA: Diagnosis present

## 2012-12-02 DIAGNOSIS — Z9484 Stem cells transplant status: Secondary | ICD-10-CM | POA: Diagnosis not present

## 2012-12-02 DIAGNOSIS — IMO0001 Reserved for inherently not codable concepts without codable children: Secondary | ICD-10-CM | POA: Diagnosis present

## 2012-12-02 DIAGNOSIS — N390 Urinary tract infection, site not specified: Secondary | ICD-10-CM | POA: Diagnosis present

## 2012-12-02 DIAGNOSIS — N189 Chronic kidney disease, unspecified: Secondary | ICD-10-CM | POA: Diagnosis not present

## 2012-12-02 DIAGNOSIS — Z87891 Personal history of nicotine dependence: Secondary | ICD-10-CM

## 2012-12-02 DIAGNOSIS — Z853 Personal history of malignant neoplasm of breast: Secondary | ICD-10-CM | POA: Diagnosis not present

## 2012-12-02 DIAGNOSIS — D638 Anemia in other chronic diseases classified elsewhere: Secondary | ICD-10-CM | POA: Diagnosis present

## 2012-12-02 DIAGNOSIS — N184 Chronic kidney disease, stage 4 (severe): Secondary | ICD-10-CM | POA: Diagnosis present

## 2012-12-02 DIAGNOSIS — E039 Hypothyroidism, unspecified: Secondary | ICD-10-CM | POA: Diagnosis present

## 2012-12-02 DIAGNOSIS — K219 Gastro-esophageal reflux disease without esophagitis: Secondary | ICD-10-CM | POA: Diagnosis present

## 2012-12-02 DIAGNOSIS — Z9010 Acquired absence of unspecified breast and nipple: Secondary | ICD-10-CM | POA: Diagnosis not present

## 2012-12-02 DIAGNOSIS — R5381 Other malaise: Secondary | ICD-10-CM | POA: Diagnosis not present

## 2012-12-02 DIAGNOSIS — J449 Chronic obstructive pulmonary disease, unspecified: Secondary | ICD-10-CM | POA: Diagnosis not present

## 2012-12-02 DIAGNOSIS — N179 Acute kidney failure, unspecified: Secondary | ICD-10-CM | POA: Diagnosis not present

## 2012-12-02 DIAGNOSIS — R259 Unspecified abnormal involuntary movements: Secondary | ICD-10-CM | POA: Diagnosis not present

## 2012-12-02 DIAGNOSIS — E876 Hypokalemia: Secondary | ICD-10-CM | POA: Diagnosis not present

## 2012-12-02 DIAGNOSIS — R9431 Abnormal electrocardiogram [ECG] [EKG]: Secondary | ICD-10-CM | POA: Diagnosis not present

## 2012-12-02 DIAGNOSIS — Z66 Do not resuscitate: Secondary | ICD-10-CM | POA: Diagnosis present

## 2012-12-02 DIAGNOSIS — R531 Weakness: Secondary | ICD-10-CM

## 2012-12-02 DIAGNOSIS — J4489 Other specified chronic obstructive pulmonary disease: Secondary | ICD-10-CM | POA: Diagnosis not present

## 2012-12-02 DIAGNOSIS — R627 Adult failure to thrive: Secondary | ICD-10-CM | POA: Diagnosis present

## 2012-12-02 DIAGNOSIS — Z8579 Personal history of other malignant neoplasms of lymphoid, hematopoietic and related tissues: Secondary | ICD-10-CM

## 2012-12-02 LAB — BASIC METABOLIC PANEL
Calcium: 12.9 mg/dL — ABNORMAL HIGH (ref 8.4–10.5)
Chloride: 106 mEq/L (ref 96–112)
Creatinine, Ser: 4.71 mg/dL — ABNORMAL HIGH (ref 0.50–1.10)
GFR calc Af Amer: 10 mL/min — ABNORMAL LOW (ref >90)
GFR calc Af Amer: 10 mL/min — ABNORMAL LOW (ref >90)
GFR calc non Af Amer: 8 mL/min — ABNORMAL LOW (ref >90)
GFR calc non Af Amer: 9 mL/min — ABNORMAL LOW (ref >90)
Glucose, Bld: 100 mg/dL — ABNORMAL HIGH (ref 70–99)
Potassium: 3.2 mEq/L — ABNORMAL LOW (ref 3.5–5.1)
Potassium: 3.3 mEq/L — ABNORMAL LOW (ref 3.5–5.1)
Sodium: 138 mEq/L (ref 135–145)
Sodium: 137 mEq/L (ref 135–145)

## 2012-12-02 LAB — URINALYSIS, ROUTINE W REFLEX MICROSCOPIC
Bilirubin Urine: NEGATIVE
Bilirubin Urine: NEGATIVE
Glucose, UA: 250 mg/dL — AB
Glucose, UA: 250 mg/dL — AB
Glucose, UA: 250 mg/dL — AB
Ketones, ur: NEGATIVE mg/dL
Ketones, ur: NEGATIVE mg/dL
Nitrite: NEGATIVE
Nitrite: NEGATIVE
Protein, ur: 100 mg/dL — AB
Protein, ur: 100 mg/dL — AB
Protein, ur: 30 mg/dL — AB
Specific Gravity, Urine: 1.024 (ref 1.005–1.030)
Urobilinogen, UA: 0.2 mg/dL (ref 0.0–1.0)
pH: 6 (ref 5.0–8.0)
pH: 6.5 (ref 5.0–8.0)
pH: 6.5 (ref 5.0–8.0)

## 2012-12-02 LAB — CBC WITH DIFFERENTIAL/PLATELET
Basophils Absolute: 0 K/uL (ref 0.0–0.1)
Basophils Relative: 0 % (ref 0–1)
Eosinophils Absolute: 0.3 10*3/uL (ref 0.0–0.7)
Eosinophils Relative: 3 % (ref 0–5)
HCT: 35.1 % — ABNORMAL LOW (ref 36.0–46.0)
Hemoglobin: 11.7 g/dL — ABNORMAL LOW (ref 12.0–15.0)
Lymphocytes Relative: 29 % (ref 12–46)
Lymphs Abs: 2.3 10*3/uL (ref 0.7–4.0)
MCH: 31.8 pg (ref 26.0–34.0)
MCHC: 33.3 g/dL (ref 30.0–36.0)
MCV: 95.4 fL (ref 78.0–100.0)
Monocytes Absolute: 0.8 K/uL (ref 0.1–1.0)
Monocytes Relative: 11 % (ref 3–12)
Neutro Abs: 4.4 K/uL (ref 1.7–7.7)
Neutrophils Relative %: 56 % (ref 43–77)
Platelets: 262 10*3/uL (ref 150–400)
RBC: 3.68 MIL/uL — ABNORMAL LOW (ref 3.87–5.11)
RDW: 14.8 % (ref 11.5–15.5)
WBC: 7.8 K/uL (ref 4.0–10.5)

## 2012-12-02 LAB — TSH
TSH: 0.996 u[IU]/mL (ref 0.350–4.500)
TSH: 0.834 u[IU]/mL (ref 0.350–4.500)

## 2012-12-02 LAB — BASIC METABOLIC PANEL WITH GFR
BUN: 35 mg/dL — ABNORMAL HIGH (ref 6–23)
BUN: 34 mg/dL — ABNORMAL HIGH (ref 6–23)
CO2: 26 mEq/L (ref 19–32)
CO2: 23 meq/L (ref 19–32)
Calcium: 12.7 mg/dL — ABNORMAL HIGH (ref 8.4–10.5)
Chloride: 108 mEq/L (ref 96–112)
Chloride: 105 meq/L (ref 96–112)
Creat: 4.8 mg/dL — ABNORMAL HIGH (ref 0.50–1.10)
GFR, Est Non African American: 9 mL/min — ABNORMAL LOW
Sodium: 141 mEq/L (ref 135–145)

## 2012-12-02 LAB — HEPATIC FUNCTION PANEL
AST: 27 U/L (ref 0–37)
Albumin: 4 g/dL (ref 3.5–5.2)
Alkaline Phosphatase: 149 U/L — ABNORMAL HIGH (ref 39–117)
Bilirubin, Direct: 0.1 mg/dL (ref 0.0–0.3)
Indirect Bilirubin: 0.6 mg/dL (ref 0.0–0.9)
Total Protein: 6.9 g/dL (ref 6.0–8.3)

## 2012-12-02 LAB — URINE MICROSCOPIC-ADD ON

## 2012-12-02 LAB — URINALYSIS, MICROSCOPIC ONLY: Bacteria, UA: NONE SEEN

## 2012-12-02 LAB — MAGNESIUM: Magnesium: 2.4 mg/dL (ref 1.5–2.5)

## 2012-12-02 LAB — TROPONIN I: Troponin I: <0.3 ng/mL (ref <0.30)

## 2012-12-02 LAB — PHOSPHORUS: Phosphorus: 3.1 mg/dL (ref 2.3–4.6)

## 2012-12-02 LAB — HEMOGLOBIN A1C: Mean Plasma Glucose: 114 mg/dL (ref <117)

## 2012-12-02 LAB — URINE CULTURE: Organism ID, Bacteria: NO GROWTH

## 2012-12-02 MED ORDER — FERROUS SULFATE 325 (65 FE) MG PO TABS
325.0000 mg | ORAL_TABLET | Freq: Every day | ORAL | Status: DC
  Administered 2012-12-03 – 2012-12-06 (×4): 325 mg via ORAL
  Filled 2012-12-02 (×5): qty 1

## 2012-12-02 MED ORDER — HYDROMORPHONE HCL PF 1 MG/ML IJ SOLN: 1.0000 mg | INTRAMUSCULAR | Status: DC | PRN

## 2012-12-02 MED ORDER — TRAMADOL HCL 50 MG PO TABS
50.0000 mg | ORAL_TABLET | Freq: Four times a day (QID) | ORAL | Status: DC | PRN
  Administered 2012-12-02 – 2012-12-06 (×2): 50 mg via ORAL
  Filled 2012-12-02 (×2): qty 1

## 2012-12-02 MED ORDER — ALBUTEROL SULFATE HFA 108 (90 BASE) MCG/ACT IN AERS
2.0000 | INHALATION_SPRAY | Freq: Two times a day (BID) | RESPIRATORY_TRACT | Status: DC
  Administered 2012-12-02 – 2012-12-06 (×9): 2 via RESPIRATORY_TRACT
  Filled 2012-12-02: qty 6.7

## 2012-12-02 MED ORDER — BACLOFEN 10 MG PO TABS
10.0000 mg | ORAL_TABLET | Freq: Every day | ORAL | Status: DC
  Administered 2012-12-02 – 2012-12-06 (×5): 10 mg via ORAL
  Filled 2012-12-02 (×5): qty 1

## 2012-12-02 MED ORDER — LOTEPREDNOL ETABONATE 0.2 % OP SUSP
1.0000 [drp] | Freq: Three times a day (TID) | OPHTHALMIC | Status: DC
  Administered 2012-12-05: 1 [drp] via OPHTHALMIC

## 2012-12-02 MED ORDER — LEVOTHYROXINE SODIUM 200 MCG PO TABS: 200.0000 ug | ORAL_TABLET | Freq: Every day | ORAL | Status: DC

## 2012-12-02 MED ORDER — PANTOPRAZOLE SODIUM 40 MG PO TBEC
40.0000 mg | DELAYED_RELEASE_TABLET | Freq: Every day | ORAL | Status: DC
  Administered 2012-12-02 – 2012-12-06 (×5): 40 mg via ORAL
  Filled 2012-12-02 (×5): qty 1

## 2012-12-02 MED ORDER — ONDANSETRON HCL 4 MG PO TABS: 4.0000 mg | ORAL_TABLET | Freq: Four times a day (QID) | ORAL | Status: DC | PRN

## 2012-12-02 MED ORDER — LORATADINE 10 MG PO TABS
10.0000 mg | ORAL_TABLET | Freq: Every day | ORAL | Status: DC
  Administered 2012-12-02 – 2012-12-04 (×3): 10 mg via ORAL
  Filled 2012-12-02 (×6): qty 1

## 2012-12-02 MED ORDER — CROMOLYN SODIUM 4 % OP SOLN
1.0000 [drp] | Freq: Four times a day (QID) | OPHTHALMIC | Status: DC
  Administered 2012-12-02 – 2012-12-06 (×16): 1 [drp] via OPHTHALMIC
  Filled 2012-12-02: qty 10

## 2012-12-02 MED ORDER — FLUCONAZOLE 150 MG PO TABS: 150.0000 mg | ORAL_TABLET | Freq: Every day | ORAL | Status: DC

## 2012-12-02 MED ORDER — VITAMIN B-12 1000 MCG PO TABS
3000.0000 ug | ORAL_TABLET | Freq: Every day | ORAL | Status: DC
  Administered 2012-12-02 – 2012-12-06 (×5): 3000 ug via ORAL
  Filled 2012-12-02 (×5): qty 3

## 2012-12-02 MED ORDER — ONDANSETRON HCL 4 MG/2ML IJ SOLN: 4.0000 mg | Freq: Four times a day (QID) | INTRAMUSCULAR | Status: DC | PRN

## 2012-12-02 MED ORDER — PREGABALIN 50 MG PO CAPS: 50.0000 mg | ORAL_CAPSULE | Freq: Three times a day (TID) | ORAL | Status: DC

## 2012-12-02 MED ORDER — PREGABALIN 50 MG PO CAPS
50.0000 mg | ORAL_CAPSULE | Freq: Every day | ORAL | Status: DC
  Administered 2012-12-02 – 2012-12-05 (×4): 50 mg via ORAL
  Filled 2012-12-02 (×5): qty 1

## 2012-12-02 MED ORDER — VITAMIN C 250 MG PO TABS
250.0000 mg | ORAL_TABLET | Freq: Every day | ORAL | Status: DC
  Administered 2012-12-02 – 2012-12-06 (×5): 250 mg via ORAL
  Filled 2012-12-02 (×5): qty 1

## 2012-12-02 MED ORDER — DEXTROSE 5 % IV SOLN
1.0000 g | INTRAVENOUS | Status: DC
  Administered 2012-12-02 – 2012-12-03 (×2): 1 g via INTRAVENOUS
  Filled 2012-12-02 (×3): qty 10

## 2012-12-02 MED ORDER — SODIUM CHLORIDE 0.9 % IV SOLN
INTRAVENOUS | Status: DC
  Administered 2012-12-02 – 2012-12-06 (×10): via INTRAVENOUS

## 2012-12-02 MED ORDER — HEPARIN SODIUM (PORCINE) 5000 UNIT/ML IJ SOLN
5000.0000 [IU] | Freq: Three times a day (TID) | INTRAMUSCULAR | Status: DC
  Administered 2012-12-02 – 2012-12-06 (×11): 5000 [IU] via SUBCUTANEOUS
  Filled 2012-12-02 (×14): qty 1

## 2012-12-02 MED ORDER — CITALOPRAM HYDROBROMIDE 40 MG PO TABS
40.0000 mg | ORAL_TABLET | Freq: Every day | ORAL | Status: DC
  Administered 2012-12-02 – 2012-12-05 (×4): 40 mg via ORAL
  Filled 2012-12-02 (×4): qty 1

## 2012-12-02 MED ORDER — SODIUM CHLORIDE 0.9 % IJ SOLN
3.0000 mL | Freq: Two times a day (BID) | INTRAMUSCULAR | Status: DC
  Administered 2012-12-02 – 2012-12-04 (×3): 3 mL via INTRAVENOUS

## 2012-12-02 MED ORDER — SUCRALFATE 1 G PO TABS
1.0000 g | ORAL_TABLET | Freq: Three times a day (TID) | ORAL | Status: DC
  Administered 2012-12-02 – 2012-12-06 (×15): 1 g via ORAL
  Filled 2012-12-02 (×19): qty 1

## 2012-12-02 MED ORDER — METOCLOPRAMIDE HCL 10 MG PO TABS
10.0000 mg | ORAL_TABLET | Freq: Three times a day (TID) | ORAL | Status: DC
  Administered 2012-12-02 – 2012-12-05 (×11): 10 mg via ORAL
  Filled 2012-12-02 (×15): qty 1

## 2012-12-02 MED ORDER — FLUDROCORTISONE ACETATE 0.1 MG PO TABS
0.1000 mg | ORAL_TABLET | ORAL | Status: DC
  Administered 2012-12-02 – 2012-12-06 (×3): 0.1 mg via ORAL
  Filled 2012-12-02 (×4): qty 1

## 2012-12-02 MED ORDER — HYDROCODONE-ACETAMINOPHEN 5-325 MG PO TABS: 1.0000 | ORAL_TABLET | ORAL | Status: DC | PRN

## 2012-12-02 MED ORDER — LEVOTHYROXINE SODIUM 25 MCG PO TABS
225.0000 ug | ORAL_TABLET | ORAL | Status: DC
  Administered 2012-12-06: 07:00:00 225 ug via ORAL
  Filled 2012-12-02 (×2): qty 1

## 2012-12-02 MED ORDER — POLYETHYLENE GLYCOL 3350 17 G PO PACK
17.0000 g | PACK | Freq: Every day | ORAL | Status: DC
  Administered 2012-12-02 – 2012-12-04 (×3): 17 g via ORAL
  Filled 2012-12-02 (×4): qty 1

## 2012-12-02 MED ORDER — SODIUM CHLORIDE 0.9 % IV SOLN
Freq: Once | INTRAVENOUS | Status: AC
  Administered 2012-12-02: 10:00:00 via INTRAVENOUS

## 2012-12-02 MED ORDER — SENNOSIDES-DOCUSATE SODIUM 8.6-50 MG PO TABS
1.0000 | ORAL_TABLET | Freq: Two times a day (BID) | ORAL | Status: DC
  Administered 2012-12-02 – 2012-12-04 (×4): 1 via ORAL
  Filled 2012-12-02 (×7): qty 1

## 2012-12-02 MED ORDER — LEVOTHYROXINE SODIUM 200 MCG PO TABS
200.0000 ug | ORAL_TABLET | ORAL | Status: DC
  Administered 2012-12-02 – 2012-12-05 (×4): 200 ug via ORAL
  Filled 2012-12-02 (×4): qty 1

## 2012-12-02 NOTE — Telephone Encounter (Signed)
Informed patient of her labs and to go to the ER. She states she will be going to [facility], will fax over her information.

## 2012-12-02 NOTE — ED Provider Notes (Signed)
CSN: [id number]     Arrival date & time 12/02/12  0912 History   First MD Initiated Contact with Patient 12/02/12 [phone]     Chief Complaint  Patient presents with  . Abnormal Lab   (Consider location/radiation/quality/duration/timing/severity/associated sxs/prior Treatment) HPI Comments: Pt has felt generally poorly for about 1 month.  Has a h/o GERD on protonix, sucralfate, went to see PCP yesterday with multiple complaints including dysuria, decrease in urination, difficulty with eating due to severe heartburn, has a h/o hiatal hernia,  Central substernal discomfort.  Has been drinking plenty of fluids due to sensation of UTI.  Also thinks she has a yeast infection.  No N/V/D.  Has felt more constipated in general.  No coughing.  No back pain.  She was prescribed cipro and reglan and diflucan, which pt has not yet filled and started.  She was called this AM that she was in renal failure and needed to go to the ED.    Patient is a 72 y.o. female presenting with weakness. The history is provided by the patient and medical records.  Weakness This is a new problem. The current episode started more than 1 week ago. The problem occurs constantly. The problem has been gradually worsening. Associated symptoms include chest pain. Pertinent negatives include no abdominal pain and no headaches.    Past Medical History  Diagnosis Date  . Hyperkalemia   . Hypothyroidism   . COPD (chronic obstructive pulmonary disease)   . Hyponatremia   . Dizziness   . Fibromyalgia   . Breast cancer   . Multiple myeloma   . Mucositis    Past Surgical History  Procedure Laterality Date  . History of port removal    . Status post stem cell transplant on September 28, 2008.    Marland Kitchen Abdominal hysterectomy  1981  . Cholecystectomy  1971  . Mastectomy Left 2008   Family History  Problem Relation Age of Onset  . Arthritis Mother   . Asthma Mother    History  Substance Use Topics  . Smoking status: Former Smoker  -- 1.00 packs/day for 30 years    Types: Cigarettes    Quit date: 02/15/2006  . Smokeless tobacco: Never Used  . Alcohol Use: No   OB History   Grav Para Term Preterm Abortions TAB SAB Ect Mult Living                 Review of Systems  Constitutional: Positive for appetite change and fatigue. Negative for fever and chills.  Cardiovascular: Positive for chest pain.  Gastrointestinal: Positive for constipation. Negative for nausea, vomiting and abdominal pain.  Genitourinary: Positive for dysuria and vaginal pain.  Musculoskeletal: Negative for back pain.  Neurological: Positive for weakness. Negative for headaches.  All other systems reviewed and are negative.    Allergies  Codeine; Latex; Iodinated diagnostic agents; and Sulfa antibiotics  Home Medications   Current Outpatient Rx  Name  Route  Sig  Dispense  Refill  . albuterol (PROVENTIL HFA;VENTOLIN HFA) 108 (90 BASE) MCG/ACT inhaler   Inhalation   Inhale 2 puffs into the lungs 2 (two) times daily.         Marland Kitchen ascorbic acid (VITAMIN C) 250 MG CHEW   Oral   Chew 250 mg by mouth daily.         . cetirizine (ZYRTEC) 10 MG tablet   Oral   Take 10 mg by mouth at bedtime.          Marland Kitchen  Cholecalciferol (VITAMIN D3) 3000 UNITS TABS   Oral   Take 10,000 Units by mouth every other day.          . citalopram (CELEXA) 40 MG tablet   Oral   Take 40 mg by mouth daily.           . Cranberry 300 MG tablet   Oral   Take 300 mg by mouth daily.         . cromolyn (OPTICROM) 4 % ophthalmic solution   Both Eyes   Place 1 drop into both eyes 4 (four) times daily.         . cyanocobalamin 2000 MCG tablet   Oral   Take 3,000 mcg by mouth daily.         . Ferrous Sulfate 134 MG TABS   Oral   Take by mouth daily.         Marland Kitchen levothyroxine (SYNTHROID, LEVOTHROID) 150 MCG tablet   Oral   Take 200 mcg by mouth daily. 225 mcg sat and sund         . loteprednol (LOTEMAX) 0.2 % SUSP      1 drop 3 (three) times  daily. Both eyes         . OVER THE COUNTER MEDICATION      Skin, hair and nail vitamin         . pantoprazole (PROTONIX) 40 MG tablet   Oral   Take 40 mg by mouth daily.         . pregabalin (LYRICA) 50 MG capsule   Oral   Take 50 mg by mouth 3 (three) times daily.         . sucralfate (CARAFATE) 1 G tablet   Oral   Take 1 g by mouth 4 (four) times daily -  with meals and at bedtime.         . traMADol (ULTRAM) 50 MG tablet   Oral   Take 50 mg by mouth every 6 (six) hours as needed.           . baclofen (LIORESAL) 10 MG tablet   Oral   Take 1 tablet (10 mg total) by mouth daily.   60 tablet   1   . ciprofloxacin (CIPRO) 500 MG tablet   Oral   Take 1 tablet (500 mg total) by mouth 2 (two) times daily.   14 tablet   0   . esomeprazole (NEXIUM) 40 MG capsule   Oral   Take 1 capsule (40 mg total) by mouth daily before breakfast. 30 minutes before meal   30 capsule   2   . fluconazole (DIFLUCAN) 150 MG tablet   Oral   Take 1 tablet (150 mg total) by mouth daily.   1 tablet   3   . fludrocortisone (FLORINEF) 0.1 MG tablet   Oral   Take 0.1 mg by mouth every other day.         . metoCLOPramide (REGLAN) 10 MG tablet   Oral   Take 1 tablet (10 mg total) by mouth 4 (four) times daily -  before meals and at bedtime.   60 tablet   2    BP 117/66  Pulse 68  Temp(Src) 98.1 F (36.7 C) (Oral)  Resp 18  SpO2 98% Physical Exam  Nursing note and vitals reviewed. Constitutional: She is oriented to person, place, and time. She appears well-developed and well-nourished. No distress.  HENT:  Head: Normocephalic and atraumatic.  Eyes: Conjunctivae and EOM are normal. No scleral icterus.  Neck: Neck supple.  Cardiovascular: Normal rate, regular rhythm and intact distal pulses.   No murmur heard. Pulmonary/Chest: Effort normal. No respiratory distress. She has no wheezes. She has no rales.  Abdominal: Soft. She exhibits no distension. There is no  tenderness. There is no rebound.  Musculoskeletal: She exhibits no edema.  Neurological: She is alert and oriented to person, place, and time. No cranial nerve deficit. She exhibits normal muscle tone. Coordination normal.  Skin: Skin is warm and dry.  Psychiatric: She has a normal mood and affect.    ED Course  Procedures (including critical care time) Labs Review Labs Reviewed  CBC WITH DIFFERENTIAL - Abnormal; Notable for the following:    RBC 3.68 (*)    Hemoglobin 11.7 (*)    HCT 35.1 (*)    All other components within normal limits  BASIC METABOLIC PANEL - Abnormal; Notable for the following:    Potassium 3.2 (*)    Glucose, Bld 100 (*)    BUN 34 (*)    Creatinine, Ser 4.71 (*)    Calcium 12.9 (*)    GFR calc non Af Amer 8 (*)    GFR calc Af Amer 10 (*)    All other components within normal limits  TROPONIN I  URINALYSIS, ROUTINE W REFLEX MICROSCOPIC  TSH   Imaging Review US Renal  12/02/2012   CLINICAL DATA:  Renal failure.  EXAM: RENAL/URINARY TRACT ULTRASOUND COMPLETE  COMPARISON:  07/20/2010.  FINDINGS: Right Kidney  Length: 8.5 cm.  Renal parenchyma is echogenic and irregular.  Left Kidney  Length: 11.4 cm.  Renal parenchyma is echogenic and irregular.  Bladder  Appears normal for degree of bladder distention.  IMPRESSION: Echogenic renal parenchyma with thick irregular contour consistent with chronic medical renal disease. No hydronephrosis. Bladder nondistended.   Electronically Signed   By: Marcello Moores  Register   On: 12/02/2012 10:46    EKG Interpretation    Date/Time:  Tuesday December 02 2012 10:04:40 EST Ventricular Rate:  62 PR Interval:  174 QRS Duration: 82 QT Interval:  410 QTC Calculation: 416 R Axis:   57 Text Interpretation:  Sinus rhythm Low voltage, precordial leads Nonspecific T wave abnormality Anterior leads Borderline ECG           ra sat is 98% and I interpret to be normal  11:02 AM Labs do indicated acute renal failure.  Also  concerning is Ca is elevated whereas in March it was normal and pt has a h/o MM.  Will consult [facility] for admission. MDM   1. Acute renal failure   2. Hypercalcemia   3. H/O multiple myeloma      Pt appears non toxic, will repeat some labs, add TSH to labs due to fatigue and chronic symptoms, obtain a renal U/S and start IVF's.  Review of labs from yesterday, her Cr is 4.8, up from 2.0 in March of 2014.      Saddie Benders. Tasfia Vasseur, MD 12/02/12 1103

## 2012-12-02 NOTE — H&P (Addendum)
[facility] History and Physical  Nancy Norris [id number] DOB: 26-Mar-1940 DOA: 12/02/2012  Referring physician: ED physician PCP: [name], MD   Chief Complaint: weakness   HPI:  Pt is 72 yo female with MM who presents to [facility] ED with main concern of progressively worsening generalized weakness that she inially noted several weeks prior to this admission. This has been associated with lower quadrants abdominal discomfort, dysuria, oliguria, poor oral intake, constipation. She explains she has seen her PCP one day prior to this admission and was given Ciprofloxacin for presumptive UTI, and Diflucan for possible yeast infection, Reglan for nausea. She currently denies chest pain or shortness of breath but reports lower quadrants abdominal discomfort. She denies fevers, chills, no similar events in the past, no specific focal neurological weakness.   In ED, pt hemodynamically stable, electrolyte panel with K 3.2, Ca 12.9, Cr 4.71. Pt admitted for further evaluation and management of presumptive UTI. [facility] asked to admit to telemetry floor.    Assessment and Plan:  Principal Problem:   Generalized weakness - this appears to be multifactorial and secondary to progressive deconditioning, failure to thrive, UTI, hypercalcemia  - will admit to telemetry floor for observation - will check TSH, 12 lead EKG, one set of CE's - PT evaluation once pt more medically stable  Active Problems:   Acute on chronic renal failure - last Cr in March 2014 was ~2 - this new elevation is likely secondary to pre renal etiology and possibly combination of new higher baseline in terms of Cr level - will provide IVF for now, treat UTI - repeat BMP In AM   Hypercalcemia  - will provide IVF and will repeat BMP in AM   UTI - based on clinical symptoms and UA - will treat with empiric Rocephin and will follow up on urine cultures    Hypokalemia - mild, will supplement via PO route - BMP in  AM - check mg level   Multiple myeloma in remission - Multiple myeloma diagnosed in 2002, initially with smoldering myeloma at [facility] - Ductal carcinoma in situ status post mastectomy with sentinel lymph node biopsy in October 2008 - follows with Dr. Julien Nordmann   Code Status: Full Family Communication: Pt at bedside Disposition Plan: Admit to telemetry bed   Review of Systems:  Constitutional: Negative for diaphoresis.  HENT: Negative for hearing loss, ear pain, nosebleeds, congestion, sore throat, neck pain, tinnitus and ear discharge.   Eyes: Negative for blurred vision, double vision, photophobia, pain, discharge and redness.  Respiratory: Negative for cough, hemoptysis, sputum production, shortness of breath, wheezing and stridor.   Cardiovascular: Negative for chest pain, palpitations, orthopnea, claudication and leg swelling.  Gastrointestinal: Negative for heartburn, constipation, blood in stool and melena.  Genitourinary: Negative flank pain.  Musculoskeletal: Negative for myalgias, back pain, joint pain and falls.  Skin: Negative for itching and rash.  Neurological: Negative for tingling, tremors, sensory change, speech change, focal weakness, loss of consciousness and headaches.  Endo/Heme/Allergies: Negative for environmental allergies and polydipsia. Does not bruise/bleed easily.  Psychiatric/Behavioral: Negative for suicidal ideas. The patient is not nervous/anxious.      Past Medical History  Diagnosis Date  . Hyperkalemia   . Hypothyroidism   . COPD (chronic obstructive pulmonary disease)   . Hyponatremia   . Dizziness   . Fibromyalgia   . Breast cancer   . Multiple myeloma   . Mucositis     Past Surgical History  Procedure Laterality Date  .  History of port removal    . Status post stem cell transplant on September 28, 2008.    Marland Kitchen Abdominal hysterectomy  1981  . Cholecystectomy  1971  . Mastectomy Left 2008    Social History:  reports that  she quit smoking about 6 years ago. Her smoking use included Cigarettes. She has a 30 pack-year smoking history. She has never used smokeless tobacco. She reports that she does not drink alcohol or use illicit drugs.  Allergies  Allergen Reactions  . Codeine Anaphylaxis  . Latex Shortness Of Breath    Adhesive products   . Iodinated Diagnostic Agents Itching    Happened 40 years ago  . Sulfa Antibiotics Itching    Family History  Problem Relation Age of Onset  . Arthritis Mother   . Asthma Mother     Prior to Admission medications   Medication Sig Start Date End Date Taking? Authorizing Provider  albuterol (PROVENTIL HFA;VENTOLIN HFA) 108 (90 BASE) MCG/ACT inhaler Inhale 2 puffs into the lungs 2 (two) times daily.   Yes Historical Provider, MD  ascorbic acid (VITAMIN C) 250 MG CHEW Chew 250 mg by mouth daily.   Yes Historical Provider, MD  cetirizine (ZYRTEC) 10 MG tablet Take 10 mg by mouth at bedtime.    Yes Historical Provider, MD  Cholecalciferol (VITAMIN D3) 3000 UNITS TABS Take 10,000 Units by mouth every other day.    Yes Historical Provider, MD  citalopram (CELEXA) 40 MG tablet Take 40 mg by mouth daily.     Yes Historical Provider, MD  Cranberry 300 MG tablet Take 300 mg by mouth daily.   Yes Historical Provider, MD  cromolyn (OPTICROM) 4 % ophthalmic solution Place 1 drop into both eyes 4 (four) times daily.   Yes Historical Provider, MD  cyanocobalamin 2000 MCG tablet Take 3,000 mcg by mouth daily.   Yes Historical Provider, MD  Ferrous Sulfate 134 MG TABS Take by mouth daily.   Yes Historical Provider, MD  levothyroxine (SYNTHROID, LEVOTHROID) 150 MCG tablet Take 200 mcg by mouth daily. 225 mcg sat and sund   Yes Historical Provider, MD  loteprednol (LOTEMAX) 0.2 % SUSP 1 drop 3 (three) times daily. Both eyes   Yes Historical Provider, MD  OVER THE COUNTER MEDICATION Skin, hair and nail vitamin   Yes Historical Provider, MD  pantoprazole (PROTONIX) 40 MG tablet Take 40 mg  by mouth daily.   Yes Historical Provider, MD  pregabalin (LYRICA) 50 MG capsule Take 50 mg by mouth 3 (three) times daily.   Yes Historical Provider, MD  sucralfate (CARAFATE) 1 G tablet Take 1 g by mouth 4 (four) times daily -  with meals and at bedtime.   Yes Historical Provider, MD  traMADol (ULTRAM) 50 MG tablet Take 50 mg by mouth every 6 (six) hours as needed.     Yes Historical Provider, MD  baclofen (LIORESAL) 10 MG tablet Take 1 tablet (10 mg total) by mouth daily. 12/01/12 12/01/13  Vicie Mutters, PA-C  ciprofloxacin (CIPRO) 500 MG tablet Take 1 tablet (500 mg total) by mouth 2 (two) times daily. 12/01/12   Vicie Mutters, PA-C  esomeprazole (NEXIUM) 40 MG capsule Take 1 capsule (40 mg total) by mouth daily before breakfast. 30 minutes before meal 12/01/12   Vicie Mutters, PA-C  fluconazole (DIFLUCAN) 150 MG tablet Take 1 tablet (150 mg total) by mouth daily. 12/01/12   Vicie Mutters, PA-C  fludrocortisone (FLORINEF) 0.1 MG tablet Take 0.1 mg by mouth every other day.  Historical Provider, MD  metoCLOPramide (REGLAN) 10 MG tablet Take 1 tablet (10 mg total) by mouth 4 (four) times daily -  before meals and at bedtime. 12/01/12   Vicie Mutters, PA-C    Physical Exam: Filed Vitals:   12/02/12 0918  BP: 117/66  Pulse: 68  Temp: 98.1 F (36.7 C)  TempSrc: Oral  Resp: 18  SpO2: 98%    Physical Exam  Constitutional: Appears well-developed and well-nourished. No distress.  HENT: Normocephalic. External right and left ear normal. Dry MM Eyes: Conjunctivae and EOM are normal. PERRLA, no scleral icterus.  Neck: Normal ROM. Neck supple. No JVD. No tracheal deviation. No thyromegaly.  CVS: RRR, S1/S2 +, no murmurs, no gallops, no carotid bruit.  Pulmonary: Effort and breath sounds normal, no stridor, rhonchi, wheezes, rales.  Abdominal: Soft. BS +,  no distension, tenderness in lower abdominal quadrants, no rebound or guarding.  Musculoskeletal: Normal range of motion. No edema  and no tenderness.  Lymphadenopathy: No lymphadenopathy noted, cervical, inguinal. Neuro: Alert. Normal reflexes, muscle tone coordination. No cranial nerve deficit. Skin: Skin is warm and dry. No rash noted. Not diaphoretic. No erythema. No pallor.  Psychiatric: Normal mood and affect. Behavior, judgment, thought content normal.   Labs on Admission:  Basic Metabolic Panel:  Recent Labs Lab 12/01/12 1609 12/02/12 1000  NA 141 138  K 3.8 3.2*  CL 108 105  CO2 26 23  GLUCOSE 94 100*  BUN 35* 34*  CREATININE 4.80* 4.71*  CALCIUM 12.7* 12.9*   Liver Function Tests:  Recent Labs Lab 12/01/12 1609  AST 27  ALT 24  ALKPHOS 149*  BILITOT 0.7  PROT 6.9  ALBUMIN 4.0    Recent Labs Lab 12/01/12 1609  AMYLASE 100   CBC:  Recent Labs Lab 12/02/12 1000  WBC 7.8  NEUTROABS 4.4  HGB 11.7*  HCT 35.1*  MCV 95.4  PLT 262   Cardiac Enzymes:  Recent Labs Lab 12/02/12 1000  TROPONINI <0.30   Radiological Exams on Admission: US Renal   12/02/2012   Echogenic renal parenchyma with thick irregular contour consistent with chronic medical renal disease. No hydronephrosis. Bladder nondistended.     EKG: Normal sinus rhythm, no ST/T wave changes  Faye Ramsay, MD  [facility] Pager [phone]  If 7PM-7AM, please contact night-coverage [email or web address] Password [email or web address] 12/02/2012, 11:10 AM

## 2012-12-02 NOTE — ED Notes (Signed)
Pt was sent here from her pcp with abnormal kidney function labs. Pt sts was told she is in acute kidney failure and needs fluids.

## 2012-12-03 DIAGNOSIS — R5381 Other malaise: Secondary | ICD-10-CM | POA: Diagnosis not present

## 2012-12-03 DIAGNOSIS — Z87898 Personal history of other specified conditions: Secondary | ICD-10-CM | POA: Diagnosis not present

## 2012-12-03 DIAGNOSIS — N179 Acute kidney failure, unspecified: Secondary | ICD-10-CM | POA: Diagnosis not present

## 2012-12-03 LAB — BASIC METABOLIC PANEL
BUN: 34 mg/dL — ABNORMAL HIGH (ref 6–23)
CO2: 22 mEq/L (ref 19–32)
Chloride: 107 mEq/L (ref 96–112)
Creatinine, Ser: 4.45 mg/dL — ABNORMAL HIGH (ref 0.50–1.10)
GFR calc Af Amer: 11 mL/min — ABNORMAL LOW (ref >90)
GFR calc non Af Amer: 9 mL/min — ABNORMAL LOW (ref >90)
Glucose, Bld: 107 mg/dL — ABNORMAL HIGH (ref 70–99)
Potassium: 3.2 mEq/L — ABNORMAL LOW (ref 3.5–5.1)

## 2012-12-03 LAB — CBC
HCT: 30.7 % — ABNORMAL LOW (ref 36.0–46.0)
Hemoglobin: 10 g/dL — ABNORMAL LOW (ref 12.0–15.0)
MCH: 31.5 pg (ref 26.0–34.0)
MCHC: 32.6 g/dL (ref 30.0–36.0)
RBC: 3.17 MIL/uL — ABNORMAL LOW (ref 3.87–5.11)
RDW: 15.1 % (ref 11.5–15.5)

## 2012-12-03 MED ORDER — POTASSIUM CHLORIDE CRYS ER 20 MEQ PO TBCR
20.0000 meq | EXTENDED_RELEASE_TABLET | Freq: Once | ORAL | Status: AC
  Administered 2012-12-03: 13:00:00 20 meq via ORAL
  Filled 2012-12-03: qty 1

## 2012-12-03 NOTE — Clinical Documentation Improvement (Signed)
Noted per 12/02/12 H&P.Marland KitchenMarland Kitchen"Acute on chronic renal failure - last Cr in March 2014 was ~2  - this new elevation is likely secondary to pre renal etiology and possibly combination of new higher baseline in terms of Cr level - will provide IVF for now, treat UTI - repeat BMP In AM"...  For accurate Dx specificity & severity can noted "chronic renal failure" be further specified with stage. Thank you  Possible Clinical Conditions?  CKD Stage I - GFR > OR = 90 CKD Stage II - GFR 60-80 CKD Stage III - GFR 30-59 CKD Stage IV - GFR 15-29 CKD Stage V - GFR < 15 ESRD (End Stage Renal Disease) Other condition Cannot Clinically determine   Supporting Information: Risk Factors: See above note Signs & Symptoms: See above note Diagnostics:  12/01/12:  BUN 35*  CREATININE 4.80*  12/02/12:  BUN 34*    CREATININE 4.71* GFR calc non Af Amer 8 (*)   Treatment: See above note  Thank You, Ermelinda Das, RN, BSN, CCDS Certified Clinical Documentation Specialist Pager: [facility]

## 2012-12-03 NOTE — Progress Notes (Signed)
A church friend contacted the chaplains through the on call pager because patient had asked for a Bible. I brought her one, but patient was sleeping soundly. Will follow up tomorrow when she is awake.

## 2012-12-03 NOTE — Progress Notes (Signed)
[facility] PROGRESS NOTE  Nancy Norris [id number] DOB: 03-07-1940 DOA: 12/02/2012  PCP: [name], MD  Brief HPI: Pt is 72 yo female with MM who presented to [facility] ED with main concern of progressively worsening generalized weakness that she initially noted several weeks prior to this admission. This has been associated with lower quadrants abdominal discomfort, dysuria, oliguria, poor oral intake, constipation. She went to her PCP one day prior to this admission and was given Ciprofloxacin for presumptive UTI, and Diflucan for possible yeast infection, Reglan for nausea. She had blood work by her PCP which revealed acute on chronic renal failure and was asked to come to ED.  Past medical history:  Past Medical History  Diagnosis Date  . Hyperkalemia   . Hypothyroidism   . COPD (chronic obstructive pulmonary disease)   . Hyponatremia   . Dizziness   . Fibromyalgia   . Breast cancer   . Multiple myeloma   . Mucositis     Consultants: Oncologist is aware.  Procedures: None  Antibiotics: Ceftriaxone 11/18-->  Subjective: Patient feels better, stronger. Denies nausea or vomiting. No diarrhea. Passing urine. No dysuria today.  Objective: Vital Signs  Filed Vitals:   12/02/12 2026 12/02/12 2058 12/03/12 0546 12/03/12 0740  BP:  117/53 111/62   Pulse:  71 58   Temp:  98.7 F (37.1 C) 98.5 F (36.9 C)   TempSrc:  Oral Oral   Resp:  18 18   Height:      Weight:      SpO2: 92% 94% 96% 98%    Filed Weights   12/02/12 1332  Weight: 83.6 kg (184 lb 4.9 oz)    Intake/Output from previous day: 11/18 0701 - 11/19 0700 In: 2268.8 [P.O.:360; I.V.:1858.8; IV Piggyback:50] Out: [id number]  General appearance: alert, cooperative, appears stated age and no distress Resp: clear to auscultation bilaterally Cardio: regular rate and rhythm, S1, S2 normal, no murmur, click, rub or gallop GI: soft, non-tender; bowel sounds normal; no masses,  no  organomegaly Extremities: extremities normal, atraumatic, no cyanosis or edema Pulses: 2+ and symmetric Skin: Skin color, texture, turgor normal. No rashes or lesions  Lab Results:  Basic Metabolic Panel:  Recent Labs Lab 12/01/12 1609 12/02/12 1000 12/02/12 1407 12/03/12 0509  NA 141 138 137 137  K 3.8 3.2* 3.3* 3.2*  CL 108 105 106 107  CO2 26 23 23 22   GLUCOSE 94 100* 83 107*  BUN 35* 34* 34* 34*  CREATININE 4.80* 4.71* 4.62* 4.45*  CALCIUM 12.7* 12.9* 11.9* 11.6*  MG  --   --  2.4  --   PHOS  --   --  3.1  --    Liver Function Tests:  Recent Labs Lab 12/01/12 1609  AST 27  ALT 24  ALKPHOS 149*  BILITOT 0.7  PROT 6.9  ALBUMIN 4.0    Recent Labs Lab 12/01/12 1609  AMYLASE 100   CBC:  Recent Labs Lab 12/02/12 1000 12/03/12 0509  WBC 7.8 5.7  NEUTROABS 4.4  --   HGB 11.7* 10.0*  HCT 35.1* 30.7*  MCV 95.4 96.8  PLT 262 218   Cardiac Enzymes:  Recent Labs Lab 12/02/12 1000  TROPONINI <0.30     Recent Results (from the past 240 hour(s))  URINE CULTURE     Status: None   Collection Time    12/01/12  3:55 PM      Result Value Range Status   Colony Count NO GROWTH  Final   Organism ID, Bacteria NO GROWTH   Final      Studies/Results: Dg Chest 2 View  12/02/2012   CLINICAL DATA:  Abdominal pain, former smoker, history COPD, breast cancer, multiple myeloma  EXAM: CHEST  2 VIEW  COMPARISON:  08/22/2009  FINDINGS: Normal heart size and pulmonary vascularity.  Tortuous thoracic aorta with atherosclerotic calcification.  Emphysematous and bronchitic changes consistent with COPD.  Minimal atelectasis or scarring at lung bases.  No definite acute infiltrate, pleural effusion or pneumothorax.  Prior left breast surgery.  Osseous demineralization.  IMPRESSION: COPD changes with minimal atelectasis or scarring at lung bases.  Remaining lungs clear.   Electronically Signed   By: Lavonia Dana M.D.   On: 12/02/2012 11:21   US Renal  12/02/2012   CLINICAL  DATA:  Renal failure.  EXAM: RENAL/URINARY TRACT ULTRASOUND COMPLETE  COMPARISON:  07/20/2010.  FINDINGS: Right Kidney  Length: 8.5 cm.  Renal parenchyma is echogenic and irregular.  Left Kidney  Length: 11.4 cm.  Renal parenchyma is echogenic and irregular.  Bladder  Appears normal for degree of bladder distention.  IMPRESSION: Echogenic renal parenchyma with thick irregular contour consistent with chronic medical renal disease. No hydronephrosis. Bladder nondistended.   Electronically Signed   By: [place]   On: 12/02/2012 10:46    Medications:  Scheduled: . albuterol  2 puff Inhalation BID  . baclofen  10 mg Oral Daily  . cefTRIAXone (ROCEPHIN)  IV  1 g Intravenous Q24H  . citalopram  40 mg Oral Daily  . cromolyn  1 drop Both Eyes QID  . ferrous sulfate  325 mg Oral Daily  . fludrocortisone  0.1 mg Oral QODAY  . heparin  5,000 Units Subcutaneous Q8H  . levothyroxine  200 mcg Oral Custom   And  . [START ON 12/06/2012] levothyroxine  225 mcg Oral Custom  . loratadine  10 mg Oral Daily  . loteprednol  1 drop Both Eyes TID  . metoCLOPramide  10 mg Oral TID AC & HS  . pantoprazole  40 mg Oral Daily  . polyethylene glycol  17 g Oral Daily  . pregabalin  50 mg Oral Daily  . senna-docusate  1 tablet Oral BID  . sodium chloride  3 mL Intravenous Q12H  . sucralfate  1 g Oral TID WC & HS  . cyanocobalamin  3,000 mcg Oral Daily  . vitamin C  250 mg Oral Daily   Continuous: . sodium chloride 75 mL/hr at 12/02/12 2251   [id number] (ZOFRAN) IV, ondansetron, traMADol  Assessment/Plan:  Principal Problem:   Generalized weakness Active Problems:   Multiple myeloma in remission   Acute on chronic renal failure   Hypercalcemia   Hypokalemia    Generalized weakness  This appears to be multifactorial and secondary to progressive deconditioning, failure to thrive, UTI, hypercalcemia and ARF. Seems to be improving slowly. Seen by PT.   Acute on chronic renal failure stage 3-4   Acute worsening could be from poor oral intake and UTI though could also be from her MM. Creat is better today. Continue IV hydration but increase rate for now.   Hypercalcemia  Possibly from dehydration and is improving slowly. But could also be from MM. Oncologist is aware. This can be pursued further as OP. No clear indication for bisphosphonates at this time.   UTI Continue Ceftriaxone. Urine culture is pending.  Hypokalemia  Replete cautiously.  Multiple myeloma in remission  Multiple myeloma diagnosed in 2002, initially with  smoldering myeloma. She is status post autologous peripheral blood stem cell transplant on October 01, 2008 at [facility]. Dr. Julien Nordmann aware of this admission. See discussion above regarding hypercalcemia.  History of Breast Cancer Ductal carcinoma in situ status post mastectomy with sentinel lymph node biopsy in October 2008. Not on treatment currently.  Code Status: Patient wanted to discuss advance directives. She wishes to be DNR/DNI.  DVT Prophylaxis: Heparin Family Communication: Discussed with patient  Disposition Plan: Not ready for discharge    LOS: 1 day   [facility] Pager [phone] 12/03/2012, 11:40 AM  If 8PM-8AM, please contact night-coverage at [email or web address], password [email or web address]

## 2012-12-03 NOTE — Progress Notes (Signed)
Pt stated she does not want the bed alarm on because it is hard for her to use the bathroom when someone is outside the door waiting. Pt was educated about the reasoning of having a bed alarm. Pt stated she is "independent." Pt denies pain or dizziness and does not appear to be in acute distress. Pt was encouraged to call staff if she needs further assistance.

## 2012-12-03 NOTE — Evaluation (Signed)
Physical Therapy Evaluation Patient Details Name: Nancy Norris MRN: [id number] DOB: [birth year] Today's Date: 12/03/2012 Time: [id number] PT Time Calculation (min): 16 min  PT Assessment / Plan / Recommendation History of Present Illness  Pt is 72 yo female with MM who presents to [facility] ED with main concern of progressively worsening generalized weakness that she inially noted several weeks prior to this admission. This has been associated with lower quadrants abdominal discomfort, dysuria, oliguria, poor oral intake, constipation. She explains she has seen her PCP one day prior to this admission and was given Ciprofloxacin for presumptive UTI, and Diflucan for possible yeast infection, Reglan for nausea. She currently denies chest pain or shortness of breath but reports lower quadrants abdominal discomfort. She denies fevers, chills, no similar events in the past, no specific focal neurological weakness  Clinical Impression  Pt requires no further PT. Ambulating independently. Discussed safety in dark, pt reports lights on at night.    PT Assessment  Patent does not need any further PT services    Follow Up Recommendations  No PT follow up    Does the patient have the potential to tolerate intense rehabilitation      Barriers to Discharge        Equipment Recommendations  None recommended by PT    Recommendations for Other Services     Frequency      Precautions / Restrictions Precautions Precautions: Fall Restrictions Weight Bearing Restrictions: No   Pertinent Vitals/Pain none      Mobility  Bed Mobility Supine to Sit: 7: Independent Transfers Transfers: Sit to Stand;Stand to Sit Sit to Stand: 7: Independent;From bed Stand to Sit: 7: Independent;To chair/3-in-1 Ambulation/Gait Ambulation/Gait Assistance: 7: Independent Ambulation Distance (Feet): 400 Feet Assistive device: None Ambulation/Gait Assistance Details: no problems noted during ambulation. Gait Pattern:  Step-through pattern General Gait Details: pt does pound heels hard     Exercises     PT Diagnosis:    PT Problem List:   PT Treatment Interventions:       PT Goals(Current goals can be found in the care plan section)    Visit Information  Last PT Received On: 12/03/12 Assistance Needed: +1 History of Present Illness: Pt is 72 yo female with MM who presents to [facility] ED with main concern of progressively worsening generalized weakness that she inially noted several weeks prior to this admission. This has been associated with lower quadrants abdominal discomfort, dysuria, oliguria, poor oral intake, constipation. She explains she has seen her PCP one day prior to this admission and was given Ciprofloxacin for presumptive UTI, and Diflucan for possible yeast infection, Reglan for nausea. She currently denies chest pain or shortness of breath but reports lower quadrants abdominal discomfort. She denies fevers, chills, no similar events in the past, no specific focal neurological weakness       Prior [place] expects to be discharged to:: Private residence Living Arrangements: Alone Available Help at Discharge: Friend(s);Neighbor;Available 24 hours/day Type of Home: House Home Access: Ramped entrance Home Layout: One level Home Equipment: None Prior Function Level of Independence: Independent Communication Communication: No difficulties    Cognition  Cognition Arousal/Alertness: Awake/alert Behavior During Therapy: WFL for tasks assessed/performed Overall Cognitive Status: Within Functional Limits for tasks assessed    Extremity/Trunk Assessment Upper Extremity Assessment Upper Extremity Assessment: Overall WFL for tasks assessed Lower Extremity Assessment Lower Extremity Assessment: RLE deficits/detail;LLE deficits/detail RLE Sensation: history of peripheral neuropathy LLE Sensation: history of peripheral neuropathy   Balance  Balance Balance  Assessed: Yes Dynamic Standing Balance Dynamic Standing - Balance Support: No upper extremity supported Dynamic Standing - Level of Assistance: 7: Independent  End of Session PT - End of Session Activity Tolerance: Patient tolerated treatment well Patient left: in chair;with call bell/phone within reach;with family/visitor present Nurse Communication: Mobility status  GP     Claretha Cooper 12/03/2012, 11:24 AM Tresa Endo PT [phone]

## 2012-12-04 DIAGNOSIS — R5381 Other malaise: Secondary | ICD-10-CM | POA: Diagnosis not present

## 2012-12-04 DIAGNOSIS — N189 Chronic kidney disease, unspecified: Secondary | ICD-10-CM

## 2012-12-04 DIAGNOSIS — Z87898 Personal history of other specified conditions: Secondary | ICD-10-CM | POA: Diagnosis not present

## 2012-12-04 DIAGNOSIS — N179 Acute kidney failure, unspecified: Secondary | ICD-10-CM | POA: Diagnosis not present

## 2012-12-04 LAB — BASIC METABOLIC PANEL
BUN: 31 mg/dL — ABNORMAL HIGH (ref 6–23)
CO2: 20 mEq/L (ref 19–32)
Chloride: 112 mEq/L (ref 96–112)
Creatinine, Ser: 4.33 mg/dL — ABNORMAL HIGH (ref 0.50–1.10)
GFR calc Af Amer: 11 mL/min — ABNORMAL LOW (ref >90)
Potassium: 3.6 mEq/L (ref 3.5–5.1)
Sodium: 141 mEq/L (ref 135–145)

## 2012-12-04 LAB — CBC
HCT: 29.1 % — ABNORMAL LOW (ref 36.0–46.0)
MCV: 96.4 fL (ref 78.0–100.0)
RBC: 3.02 MIL/uL — ABNORMAL LOW (ref 3.87–5.11)
RDW: 15.2 % (ref 11.5–15.5)
WBC: 6.4 10*3/uL (ref 4.0–10.5)

## 2012-12-04 LAB — URINE CULTURE: Colony Count: 15000

## 2012-12-04 LAB — GLUCOSE, CAPILLARY: Glucose-Capillary: 96 mg/dL (ref 70–99)

## 2012-12-04 MED ORDER — LORAZEPAM 2 MG/ML IJ SOLN
0.5000 mg | Freq: Once | INTRAMUSCULAR | Status: AC
  Administered 2012-12-04: 0.5 mg via INTRAVENOUS
  Filled 2012-12-04 (×2): qty 1

## 2012-12-04 MED ORDER — MORPHINE SULFATE 4 MG/ML IJ SOLN
INTRAMUSCULAR | Status: AC
  Filled 2012-12-04: qty 1

## 2012-12-04 MED ORDER — CEPHALEXIN 250 MG PO CAPS
250.0000 mg | ORAL_CAPSULE | Freq: Two times a day (BID) | ORAL | Status: DC
  Administered 2012-12-05 – 2012-12-06 (×3): 250 mg via ORAL
  Filled 2012-12-04 (×6): qty 1

## 2012-12-04 NOTE — Progress Notes (Signed)
[name] dtr, [name], reports that pt has a hx of prescription drug abuse in remote past as well as after a surgery in 2008.  She is unsure if pt is currently abusing prescription drugs and is unsure which specific drugs pt has abused in the past.  She wonders if twitching/tremors/weakness earlier today could have been related to withdrawal. Daughter looked through pt's belongings in pt's room to confirm that pt has no medications in her clothing or belongings. Dr [place] Pink paged to make aware.

## 2012-12-04 NOTE — Progress Notes (Signed)
[facility] PROGRESS NOTE  Nancy Norris [id number] DOB: [birth year] DOA: 12/02/2012  PCP: [name], MD  Brief HPI: Pt is 72 yo female with MM who presented to [facility] ED with main concern of progressively worsening generalized weakness that she initially noted several weeks prior to this admission. This has been associated with lower quadrants abdominal discomfort, dysuria, oliguria, poor oral intake, constipation. She went to her PCP one day prior to this admission and was given Ciprofloxacin for presumptive UTI, and Diflucan for possible yeast infection, Reglan for nausea. She had blood work by her PCP which revealed acute on chronic renal failure and was asked to come to ED.  Past medical history:  Past Medical History  Diagnosis Date  . Hyperkalemia   . Hypothyroidism   . COPD (chronic obstructive pulmonary disease)   . Hyponatremia   . Dizziness   . Fibromyalgia   . Breast cancer   . Multiple myeloma   . Mucositis     Consultants: Oncologist is aware.  Procedures: None  Antibiotics: Ceftriaxone 11/18-->  Subjective: Patient continues to feel better. Denies any pain. Making urine. No shortness of breath. Asking when she can go home.   Objective: Vital Signs  Filed Vitals:   12/03/12 2239 12/04/12 0600 12/04/12 0835 12/04/12 1350  BP: 106/43 120/81  106/47  Pulse: 75 64  75  Temp: 99.1 F (37.3 C) 98.6 F (37 C)  98.4 F (36.9 C)  TempSrc: Oral Oral  Oral  Resp: 18   19  Height:      Weight:      SpO2: 96% 96% 97% 98%    Filed Weights   12/02/12 1332  Weight: 83.6 kg (184 lb 4.9 oz)    Intake/Output from previous day: 11/19 0701 - 11/20 0700 In: 2471.3 [P.O.:240; I.V.:2231.3] Out: 2800 [Urine:2800]  General appearance: alert, cooperative, appears stated age and no distress Resp: clear to auscultation bilaterally Cardio: regular rate and rhythm, S1, S2 normal, no murmur, click, rub or gallop GI: soft, non-tender; bowel sounds normal;  no masses,  no organomegaly Extremities: extremities normal, atraumatic, no cyanosis or edema Pulses: 2+ and symmetric [place]: No focal deficits.  Lab Results:  Basic Metabolic Panel:  Recent Labs Lab 12/01/12 1609 12/02/12 1000 12/02/12 1407 12/03/12 0509 12/04/12 0513  NA 141 138 137 137 141  K 3.8 3.2* 3.3* 3.2* 3.6  CL 108 105 106 107 112  CO2 26 23 23 22 20   GLUCOSE 94 100* 83 107* 103*  BUN 35* 34* 34* 34* 31*  CREATININE 4.80* 4.71* 4.62* 4.45* 4.33*  CALCIUM 12.7* 12.9* 11.9* 11.6* 11.4*  MG  --   --  2.4  --   --   PHOS  --   --  3.1  --   --    Liver Function Tests:  Recent Labs Lab 12/01/12 1609  AST 27  ALT 24  ALKPHOS 149*  BILITOT 0.7  PROT 6.9  ALBUMIN 4.0    Recent Labs Lab 12/01/12 1609  AMYLASE 100   CBC:  Recent Labs Lab 12/02/12 1000 12/03/12 0509 12/04/12 0513  WBC 7.8 5.7 6.4  NEUTROABS 4.4  --   --   HGB 11.7* 10.0* 9.4*  HCT 35.1* 30.7* 29.1*  MCV 95.4 96.8 96.4  PLT 262 218 201   Cardiac Enzymes:  Recent Labs Lab 12/02/12 1000  TROPONINI <0.30     Recent Results (from the past 240 hour(s))  URINE CULTURE     Status: None  Collection Time    12/01/12  3:55 PM      Result Value Range Status   Colony Count NO GROWTH   Final   Organism ID, Bacteria NO GROWTH   Final  URINE CULTURE     Status: None   Collection Time    12/02/12  3:07 PM      Result Value Range Status   Specimen Description URINE, RANDOM   Final   Special Requests NONE   Final   Culture  Setup Time     Final   Value: 12/03/2012 00:53     Performed at [facility]     Final   Value: 15,000 COLONIES/ML     Performed at [facility]   Culture     Final   Value: KLEBSIELLA PNEUMONIAE     Performed at [facility]   Report Status 12/04/2012 FINAL   Final   Organism ID, Bacteria KLEBSIELLA PNEUMONIAE   Final      Studies/Results: No results found.  Medications:  Scheduled: . albuterol  2 puff  Inhalation BID  . baclofen  10 mg Oral Daily  . cefTRIAXone (ROCEPHIN)  IV  1 g Intravenous Q24H  . citalopram  40 mg Oral Daily  . cromolyn  1 drop Both Eyes QID  . ferrous sulfate  325 mg Oral Daily  . fludrocortisone  0.1 mg Oral QODAY  . heparin  5,000 Units Subcutaneous Q8H  . levothyroxine  200 mcg Oral Custom   And  . [START ON 12/06/2012] levothyroxine  225 mcg Oral Custom  . loratadine  10 mg Oral Daily  . loteprednol  1 drop Both Eyes TID  . metoCLOPramide  10 mg Oral TID AC & HS  . morphine      . pantoprazole  40 mg Oral Daily  . polyethylene glycol  17 g Oral Daily  . pregabalin  50 mg Oral Daily  . senna-docusate  1 tablet Oral BID  . sodium chloride  3 mL Intravenous Q12H  . sucralfate  1 g Oral TID WC & HS  . cyanocobalamin  3,000 mcg Oral Daily  . vitamin C  250 mg Oral Daily   Continuous: . sodium chloride 150 mL/hr at 12/04/12 1408   [id number] (ZOFRAN) IV, ondansetron, traMADol  Assessment/Plan:  Principal Problem:   Generalized weakness Active Problems:   Multiple myeloma in remission   Acute on chronic renal failure   Hypercalcemia   Hypokalemia    Generalized weakness  This appears to be multifactorial and secondary to progressive deconditioning, failure to thrive, UTI, hypercalcemia and ARF. Seems to be improving slowly. Seen by PT.   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 Creatinine is improving slowly. I/O is improving. Continue IV hydration but increase rate for now.   Hypercalcemia  Possibly from dehydration and is improving slowly. But could also be from MM. Oncologist is aware. This can be pursued further as OP. No clear indication for bisphosphonates at this time.   UTI Culture report is available. Insignificant Klebsiella noted. Will change to Keflex.   Hypokalemia  Repleted.  Multiple myeloma in remission  Multiple myeloma diagnosed  in 2002, initially with smoldering myeloma. She is status post autologous peripheral blood stem cell transplant in September 2010 at [facility]. Dr. Julien Nordmann aware of this admission. See discussion above regarding hypercalcemia.  History of Breast Cancer Ductal carcinoma in situ status post mastectomy with sentinel lymph node biopsy in October 2008. Not on treatment currently.  Normocytic Anemia Likely due to chronic disease.  Code Status: DNR/DNI.  DVT Prophylaxis: Heparin Family Communication: Discussed with patient  Disposition Plan: Not ready for discharge    LOS: 2 days   [place] [facility] Pager [phone] 12/04/2012, 2:09 PM  If 8PM-8AM, please contact night-coverage at [email or web address], password [email or web address]

## 2012-12-04 NOTE — Progress Notes (Signed)
Called into room by pt who states she is feeling weak and "jerking".  States she was trying to talk on the phone but was too weak to hold phone. Trembling/twitching noted to BUE. Pt was able to amb to BR w/ standby assist but states she feels weaker than previously. VSS. Denies pain/SOB/CP/dizziness/nausea. A&Ox4. CBG=96. Dr [place] Pink made aware and has given order for IV ativan. Will administer and monitor closely. Pt resting in bed, bed alarm on.

## 2012-12-04 NOTE — Progress Notes (Signed)
Pt sleeping, resp even, unlabored. O2 sat 98% on RA. No tremors/twitching noted.

## 2012-12-05 DIAGNOSIS — E876 Hypokalemia: Secondary | ICD-10-CM | POA: Diagnosis not present

## 2012-12-05 DIAGNOSIS — N179 Acute kidney failure, unspecified: Secondary | ICD-10-CM | POA: Diagnosis not present

## 2012-12-05 DIAGNOSIS — R5381 Other malaise: Secondary | ICD-10-CM | POA: Diagnosis not present

## 2012-12-05 LAB — BASIC METABOLIC PANEL
BUN: 23 mg/dL (ref 6–23)
CO2: 17 mEq/L — ABNORMAL LOW (ref 19–32)
Chloride: 114 mEq/L — ABNORMAL HIGH (ref 96–112)
Glucose, Bld: 92 mg/dL (ref 70–99)
Potassium: 3.4 mEq/L — ABNORMAL LOW (ref 3.5–5.1)
Sodium: 141 mEq/L (ref 135–145)

## 2012-12-05 LAB — CBC
HCT: 29.2 % — ABNORMAL LOW (ref 36.0–46.0)
Hemoglobin: 9.5 g/dL — ABNORMAL LOW (ref 12.0–15.0)
MCH: 31.4 pg (ref 26.0–34.0)
MCHC: 32.5 g/dL (ref 30.0–36.0)
RBC: 3.03 MIL/uL — ABNORMAL LOW (ref 3.87–5.11)
WBC: 5.6 10*3/uL (ref 4.0–10.5)

## 2012-12-05 MED ORDER — POTASSIUM CHLORIDE CRYS ER 20 MEQ PO TBCR
20.0000 meq | EXTENDED_RELEASE_TABLET | Freq: Once | ORAL | Status: AC
  Administered 2012-12-05: 13:00:00 20 meq via ORAL
  Filled 2012-12-05: qty 1

## 2012-12-05 MED ORDER — SODIUM BICARBONATE 650 MG PO TABS
650.0000 mg | ORAL_TABLET | Freq: Two times a day (BID) | ORAL | Status: DC
  Administered 2012-12-05 – 2012-12-06 (×3): 650 mg via ORAL
  Filled 2012-12-05 (×4): qty 1

## 2012-12-05 MED ORDER — CITALOPRAM HYDROBROMIDE 20 MG PO TABS
20.0000 mg | ORAL_TABLET | Freq: Every day | ORAL | Status: DC
  Administered 2012-12-06: 20 mg via ORAL
  Filled 2012-12-05: qty 1

## 2012-12-05 NOTE — Progress Notes (Signed)
[facility] PROGRESS NOTE  Nancy Norris [id number] DOB: January 03, 1941 DOA: 12/02/2012  PCP: [name], MD  Brief HPI: Pt is 72 yo female with MM who presented to [facility] ED with main concern of progressively worsening generalized weakness that she initially noted several weeks prior to this admission. This has been associated with lower quadrants abdominal discomfort, dysuria, oliguria, poor oral intake, constipation. She went to her PCP one day prior to this admission and was given Ciprofloxacin for presumptive UTI, and Diflucan for possible yeast infection, Reglan for nausea. She had blood work by her PCP which revealed acute on chronic renal failure and was asked to come to ED.  Past medical history:  Past Medical History  Diagnosis Date  . Hyperkalemia   . Hypothyroidism   . COPD (chronic obstructive pulmonary disease)   . Hyponatremia   . Dizziness   . Fibromyalgia   . Breast cancer   . Multiple myeloma   . Mucositis     Consultants: Oncologist is aware.  Procedures: None  Antibiotics: Ceftriaxone 11/18-->11/20 Keflex 11/20-->  Subjective: Patient had an episode of tremors last night. She did not have any focal deficits. It resolved with Ativan. Feels better today. Passing urine. Complaining of loose stools from laxatives and stool softeners.  Objective: Vital Signs  Filed Vitals:   12/04/12 1539 12/04/12 2023 12/05/12 0500 12/05/12 0853  BP: 123/60 106/52 119/60   Pulse: 68 70 77   Temp: 98.3 F (36.8 C) 98.2 F (36.8 C) 98.6 F (37 C)   TempSrc: Oral Oral Oral   Resp: 18 20 16    Height:      Weight:      SpO2: 96% 95% 96% 98%    Filed Weights   12/02/12 1332  Weight: 83.6 kg (184 lb 4.9 oz)    Intake/Output from previous day: 11/20 0701 - 11/21 0700 In: 4353.3 [P.O.:1200; I.V.:3153.3] Out: 4700 [Urine:4700]  General appearance: alert, cooperative, appears stated age and no distress Resp: clear to auscultation bilaterally Cardio:  regular rate and rhythm, S1, S2 normal, no murmur, click, rub or gallop GI: soft, non-tender; bowel sounds normal; no masses,  no organomegaly Extremities: extremities normal, atraumatic, no cyanosis or edema Pulses: 2+ and symmetric Nuero: No focal deficits.  Lab Results:  Basic Metabolic Panel:  Recent Labs Lab 12/02/12 1000 12/02/12 1407 12/03/12 0509 12/04/12 0513 12/05/12 0418  NA 138 137 137 141 141  K 3.2* 3.3* 3.2* 3.6 3.4*  CL 105 106 107 112 114*  CO2 23 23 22 20  17*  GLUCOSE 100* 83 107* 103* 92  BUN 34* 34* 34* 31* 23  CREATININE 4.71* 4.62* 4.45* 4.33* 3.76*  CALCIUM 12.9* 11.9* 11.6* 11.4* 11.3*  MG  --  2.4  --   --   --   PHOS  --  3.1  --   --   --    Liver Function Tests:  Recent Labs Lab 12/01/12 1609  AST 27  ALT 24  ALKPHOS 149*  BILITOT 0.7  PROT 6.9  ALBUMIN 4.0    Recent Labs Lab 12/01/12 1609  AMYLASE 100   CBC:  Recent Labs Lab 12/02/12 1000 12/03/12 0509 12/04/12 0513 12/05/12 0418  WBC 7.8 5.7 6.4 5.6  NEUTROABS 4.4  --   --   --   HGB 11.7* 10.0* 9.4* 9.5*  HCT 35.1* 30.7* 29.1* 29.2*  MCV 95.4 96.8 96.4 96.4  PLT 262 218 201 208   Cardiac Enzymes:  Recent Labs Lab 12/02/12 1000  TROPONINI <0.30     Recent Results (from the past 240 hour(s))  URINE CULTURE     Status: None   Collection Time    12/01/12  3:55 PM      Result Value Range Status   Colony Count NO GROWTH   Final   Organism ID, Bacteria NO GROWTH   Final  URINE CULTURE     Status: None   Collection Time    12/02/12  3:07 PM      Result Value Range Status   Specimen Description URINE, RANDOM   Final   Special Requests NONE   Final   Culture  Setup Time     Final   Value: 12/03/2012 00:53     Performed at [facility]     Final   Value: 15,000 COLONIES/ML     Performed at [facility]   Culture     Final   Value: KLEBSIELLA PNEUMONIAE     Performed at [facility]   Report Status 12/04/2012 FINAL    Final   Organism ID, Bacteria KLEBSIELLA PNEUMONIAE   Final      Studies/Results: No results found.  Medications:  Scheduled: . albuterol  2 puff Inhalation BID  . baclofen  10 mg Oral Daily  . cephALEXin  250 mg Oral Q12H  . citalopram  40 mg Oral Daily  . cromolyn  1 drop Both Eyes QID  . ferrous sulfate  325 mg Oral Daily  . fludrocortisone  0.1 mg Oral QODAY  . heparin  5,000 Units Subcutaneous Q8H  . levothyroxine  200 mcg Oral Custom   And  . [START ON 12/06/2012] levothyroxine  225 mcg Oral Custom  . loratadine  10 mg Oral Daily  . loteprednol  1 drop Both Eyes TID  . pantoprazole  40 mg Oral Daily  . polyethylene glycol  17 g Oral Daily  . pregabalin  50 mg Oral Daily  . senna-docusate  1 tablet Oral BID  . sodium bicarbonate  650 mg Oral BID  . sodium chloride  3 mL Intravenous Q12H  . sucralfate  1 g Oral TID WC & HS  . cyanocobalamin  3,000 mcg Oral Daily  . vitamin C  250 mg Oral Daily   Continuous: . sodium chloride 150 mL/hr at 12/05/12 0620   [id number] (ZOFRAN) IV, ondansetron, traMADol  Assessment/Plan:  Principal Problem:   Generalized weakness Active Problems:   Multiple myeloma in remission   Acute on chronic renal failure   Hypercalcemia   Hypokalemia    Generalized weakness  This appears to be multifactorial and secondary to progressive deconditioning, failure to thrive, UTI, hypercalcemia and ARF. Seems to be improving. Seen by PT.   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 Creatinine is improving slowly. I/O is improving. Continue IV hydration. Will benefit from OP nephrology input. Start Bicarb tablets.  Tremors Resolved. It is noted that she was started on Reglan recently for GERD. This could be causing her symptoms. Will stop.   Hypercalcemia  Possibly from dehydration and is improving slowly. But could also be from MM. Oncologist is  aware. This can be pursued further as OP. No clear indication for bisphosphonates at this time.   UTI Culture report is available. Insignificant Klebsiella noted. On Keflex.   Hypokalemia  Monitor for now. Give small dose.  Multiple  myeloma in remission  Multiple myeloma diagnosed in 2002, initially with smoldering myeloma. She is status post autologous peripheral blood stem cell transplant in September 2010 at [facility]. Dr. Julien Nordmann aware of this admission. See discussion above regarding hypercalcemia.  History of Breast Cancer Ductal carcinoma in situ status post mastectomy with sentinel lymph node biopsy in October 2008. Not on treatment currently.  Normocytic Anemia Likely due to chronic disease.  Code Status: DNR/DNI.  DVT Prophylaxis: Heparin Family Communication: Discussed with patient  Disposition Plan: Not ready for discharge. Hopefully in 1-2 days    LOS: 3 days   [facility] Pager [phone] 12/05/2012, 11:59 AM  If 8PM-8AM, please contact night-coverage at [email or web address], password [facility]

## 2012-12-06 DIAGNOSIS — N179 Acute kidney failure, unspecified: Secondary | ICD-10-CM | POA: Diagnosis not present

## 2012-12-06 DIAGNOSIS — Z87898 Personal history of other specified conditions: Secondary | ICD-10-CM | POA: Diagnosis not present

## 2012-12-06 DIAGNOSIS — N189 Chronic kidney disease, unspecified: Secondary | ICD-10-CM | POA: Diagnosis not present

## 2012-12-06 LAB — CBC
HCT: 27.4 % — ABNORMAL LOW (ref 36.0–46.0)
Hemoglobin: 8.9 g/dL — ABNORMAL LOW (ref 12.0–15.0)
MCH: 31.2 pg (ref 26.0–34.0)
MCHC: 32.5 g/dL (ref 30.0–36.0)
MCV: 96.1 fL (ref 78.0–100.0)
RDW: 15.4 % (ref 11.5–15.5)

## 2012-12-06 LAB — BASIC METABOLIC PANEL
BUN: 17 mg/dL (ref 6–23)
CO2: 17 mEq/L — ABNORMAL LOW (ref 19–32)
Chloride: 114 mEq/L — ABNORMAL HIGH (ref 96–112)
Creatinine, Ser: 3.48 mg/dL — ABNORMAL HIGH (ref 0.50–1.10)
Glucose, Bld: 103 mg/dL — ABNORMAL HIGH (ref 70–99)
Potassium: 3.6 mEq/L (ref 3.5–5.1)

## 2012-12-06 MED ORDER — SODIUM BICARBONATE 650 MG PO TABS: 650.0000 mg | ORAL_TABLET | Freq: Two times a day (BID) | ORAL | Status: DC

## 2012-12-06 NOTE — Discharge Summary (Signed)
[facility]  Physician Discharge Summary   Patient ID: Nancy Norris MRN: [id number] DOB/AGE: 05-20-1940 72 y.o.  Admit date: 12/02/2012 Discharge date: 12/06/2012  PCP: [name] DAVID, MD  DISCHARGE DIAGNOSES:  Principal Problem:   Generalized weakness Active Problems:   Multiple myeloma in remission   Acute on chronic renal failure   Hypercalcemia   Hypokalemia   RECOMMENDATIONS FOR OUTPATIENT FOLLOW UP: 1. Patient to see PCP on Monday for repeat labs.  2. She needs referral to Dr. Jimmy Footman for CKD. 3. She will need follow up with Dr. Julien Nordmann as well.  DISCHARGE CONDITION: fair  Diet recommendation: Low Sodium  Filed Weights   12/02/12 1332  Weight: 83.6 kg (184 lb 4.9 oz)    INITIAL HISTORY: Pt is 72 yo female with MM who presented to [facility] ED with main concern of progressively worsening generalized weakness that she initially noted several weeks prior to this admission. This has been associated with lower quadrants abdominal discomfort, dysuria, oliguria, poor oral intake, constipation. She went to her PCP one day prior to this admission and was given Ciprofloxacin for presumptive UTI, and Diflucan for possible yeast infection, Reglan for nausea. She had blood work by her PCP which revealed acute on chronic renal failure and was asked to come to ED.  Consultations:  None  Procedures:  None  HOSPITAL COURSE:   Generalized weakness  This appears to have been multifactorial and secondary to progressive deconditioning, failure to thrive, UTI, hypercalcemia and ARF. Seems to have improved. She has been ambulating. Seen by PT and has no needs.  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 But patient did not follow through. It's unclear how high the creatinine was at that time. But creatinine was 4.8 at admission. It has improved to 3.4 with  hydration. Her bicarb is slightly low. Acute worsening could be from poor oral intake and UTI though could also be from her MM. She has good urine output. Will benefit from OP nephrology input. Started on Bicarb tablets. Patient is very keen on going home today. She promises she will follow up with her PCP for labs on Monday and get referral to Nephrology. I am somewhat reluctant but since she is stable and since creatinine has been improving, she should be ok to go home. She will return if anything changes.  Tremors  Patient developed tremors. Could have been due to reglan which was initiated recently. This will be stopped.   Hypercalcemia  Possibly from dehydration and is improving slowly. But could also be from MM. Oncologist is aware and this can be pursued further as OP. No clear indication for bisphosphonates at this time.   UTI Culture report is available. Insignificant Klebsiella noted. On Keflex. Not needed at discharge.  Hypokalemia  Releted.   Multiple myeloma in remission  Multiple myeloma diagnosed in 2002, initially with smoldering myeloma. She is status post autologous peripheral blood stem cell transplant in September 2010 at [facility]. Dr. Julien Nordmann aware of this admission. See discussion above regarding hypercalcemia. Patient will follow up with Dr. Julien Nordmann.  History of Breast Cancer  Ductal carcinoma in situ status post mastectomy with sentinel lymph node biopsy in October 2008. Not on treatment currently.   Normocytic Anemia  Likely due to chronic disease.   Patient very keen on going home today. She promises close Op follow up. She is  stable otherwise.  PERTINENT LABS:  The results of significant diagnostics from this hospitalization (including imaging, microbiology, ancillary and laboratory) are listed below for reference.    Microbiology: Recent Results (from the past 240 hour(s))  URINE CULTURE     Status: None   Collection Time    12/01/12  3:55 PM       Result Value Range Status   Colony Count NO GROWTH   Final   Organism ID, Bacteria NO GROWTH   Final  URINE CULTURE     Status: None   Collection Time    12/02/12  3:07 PM      Result Value Range Status   Specimen Description URINE, RANDOM   Final   Special Requests NONE   Final   Culture  Setup Time     Final   Value: 12/03/2012 00:53     Performed at [facility]     Final   Value: 15,000 COLONIES/ML     Performed at [facility]   Culture     Final   Value: KLEBSIELLA PNEUMONIAE     Performed at [facility]   Report Status 12/04/2012 FINAL   Final   Organism ID, Bacteria KLEBSIELLA PNEUMONIAE   Final     Labs: Basic Metabolic Panel:  Recent Labs Lab 12/02/12 1407 12/03/12 0509 12/04/12 0513 12/05/12 0418 12/06/12 0424  NA 137 137 141 141 140  K 3.3* 3.2* 3.6 3.4* 3.6  CL 106 107 112 114* 114*  CO2 23 22 20  17* 17*  GLUCOSE 83 107* 103* 92 103*  BUN 34* 34* 31* 23 17  CREATININE 4.62* 4.45* 4.33* 3.76* 3.48*  CALCIUM 11.9* 11.6* 11.4* 11.3* 11.3*  MG 2.4  --   --   --   --   PHOS 3.1  --   --   --   --    Liver Function Tests:  Recent Labs Lab 12/01/12 1609  AST 27  ALT 24  ALKPHOS 149*  BILITOT 0.7  PROT 6.9  ALBUMIN 4.0    Recent Labs Lab 12/01/12 1609  AMYLASE 100   CBC:  Recent Labs Lab 12/02/12 1000 12/03/12 0509 12/04/12 0513 12/05/12 0418 12/06/12 0424  WBC 7.8 5.7 6.4 5.6 5.9  NEUTROABS 4.4  --   --   --   --   HGB 11.7* 10.0* 9.4* 9.5* 8.9*  HCT 35.1* 30.7* 29.1* 29.2* 27.4*  MCV 95.4 96.8 96.4 96.4 96.1  PLT 262 218 201 208 197   Cardiac Enzymes:  Recent Labs Lab 12/02/12 1000  TROPONINI <0.30   CBG:  Recent Labs Lab 12/04/12 1539  GLUCAP 96     IMAGING STUDIES Dg Chest 2 View  12/02/2012   CLINICAL DATA:  Abdominal pain, former smoker, history COPD, breast cancer, multiple myeloma  EXAM: CHEST  2 VIEW  COMPARISON:  08/22/2009  FINDINGS: Normal heart size and  pulmonary vascularity.  Tortuous thoracic aorta with atherosclerotic calcification.  Emphysematous and bronchitic changes consistent with COPD.  Minimal atelectasis or scarring at lung bases.  No definite acute infiltrate, pleural effusion or pneumothorax.  Prior left breast surgery.  Osseous demineralization.  IMPRESSION: COPD changes with minimal atelectasis or scarring at lung bases.  Remaining lungs clear.   Electronically Signed   By: Lavonia Dana M.D.   On: 12/02/2012 11:21   US Renal  12/02/2012   CLINICAL DATA:  Renal failure.  EXAM: RENAL/URINARY TRACT ULTRASOUND COMPLETE  COMPARISON:  07/20/2010.  FINDINGS: Right Kidney  Length: 8.5 cm.  Renal parenchyma is echogenic and irregular.  Left Kidney  Length: 11.4 cm.  Renal parenchyma is echogenic and irregular.  Bladder  Appears normal for degree of bladder distention.  IMPRESSION: Echogenic renal parenchyma with thick irregular contour consistent with chronic medical renal disease. No hydronephrosis. Bladder nondistended.   Electronically Signed   By: Marcello Moores  Register   On: 12/02/2012 10:46    DISCHARGE EXAMINATION: Filed Vitals:   12/05/12 1503 12/05/12 2004 12/05/12 2100 12/06/12 0500  BP: 117/53  121/60 119/61  Pulse: 68  75 69  Temp: 98.4 F (36.9 C)  98.9 F (37.2 C) 98.6 F (37 C)  TempSrc: Oral  Oral Oral  Resp: 18  16 14   Height:      Weight:      SpO2: 98% 95% 96% 97%   General appearance: alert, cooperative, appears stated age and no distress Resp: clear to auscultation bilaterally Cardio: regular rate and rhythm, S1, S2 normal, no murmur, click, rub or gallop GI: soft, non-tender; bowel sounds normal; no masses,  no organomegaly Neurologic: Tremors have resolved. She is back to baseline. No focal deficits.  DISPOSITION: Home  Discharge Orders   Future Appointments Provider Department Dept Phone   02/24/2013 3:00 PM [name], MD [facility] [phone]   Future Orders Complete  By Expires   Diet - low sodium heart healthy  As directed    Discharge instructions  As directed    Comments:     Please call MCKEOWN,WILLIAM DAVID, MD on 11/24 for follow up and blood work to check kidney. Discuss referral to Dr. Jimmy Footman. You will also need to see Dr. Julien Nordmann for  your Multiple Myeloma. If you notice that you are not making enough urine or feel unwell with nausea or other symptoms, please seek attention immediately.   Increase activity slowly  As directed       ALLERGIES:  Allergies  Allergen Reactions  . Codeine Anaphylaxis  . Latex Shortness Of Breath    Adhesive products   . Iodinated Diagnostic Agents Itching    Happened 40 years ago  . Sulfa Antibiotics Itching    Current Discharge Medication List    START taking these medications   Details  sodium bicarbonate 650 MG tablet Take 1 tablet (650 mg total) by mouth 2 (two) times daily. Qty: 60 tablet, Refills: 0      CONTINUE these medications which have NOT CHANGED   Details  albuterol (PROVENTIL HFA;VENTOLIN HFA) 108 (90 BASE) MCG/ACT inhaler Inhale 2 puffs into the lungs 2 (two) times daily.   Associated Diagnoses: Multiple myeloma in remission    ascorbic acid (VITAMIN C) 250 MG CHEW Chew 250 mg by mouth daily.    cetirizine (ZYRTEC) 10 MG tablet Take 10 mg by mouth at bedtime.    Associated Diagnoses: Multiple myeloma in remission    Cholecalciferol (VITAMIN D3) 3000 UNITS TABS Take 10,000 Units by mouth every other day.    Associated Diagnoses: Multiple myeloma in remission    citalopram (CELEXA) 40 MG tablet Take 40 mg by mouth daily.      Cranberry 300 MG tablet Take 300 mg by mouth daily.    cromolyn (OPTICROM) 4 % ophthalmic solution Place 1 drop into both eyes 4 (four) times daily.   Associated Diagnoses: Multiple myeloma in remission    cyanocobalamin 2000 MCG tablet Take 3,000 mcg by mouth daily.   Associated Diagnoses: Multiple myeloma in  remission    Ferrous Sulfate 134 MG TABS  Take by mouth daily.    levothyroxine (SYNTHROID, LEVOTHROID) 150 MCG tablet Take 200 mcg by mouth daily. 225 mcg sat and sund    loteprednol (LOTEMAX) 0.2 % SUSP 1 drop 3 (three) times daily. Both eyes   Associated Diagnoses: Multiple myeloma in remission    OVER THE COUNTER MEDICATION Skin, hair and nail vitamin    pantoprazole (PROTONIX) 40 MG tablet Take 40 mg by mouth daily.    pregabalin (LYRICA) 50 MG capsule Take 50 mg by mouth 3 (three) times daily.    traMADol (ULTRAM) 50 MG tablet Take 50 mg by mouth every 6 (six) hours as needed.      baclofen (LIORESAL) 10 MG tablet Take 1 tablet (10 mg total) by mouth daily. Qty: 60 tablet, Refills: 1   Associated Diagnoses: TMJ (temporomandibular joint disorder)    fludrocortisone (FLORINEF) 0.1 MG tablet Take 0.1 mg by mouth every other day.   Associated Diagnoses: Multiple myeloma in remission      STOP taking these medications     sucralfate (CARAFATE) 1 G tablet      ciprofloxacin (CIPRO) 500 MG tablet      esomeprazole (NEXIUM) 40 MG capsule      fluconazole (DIFLUCAN) 150 MG tablet      metoCLOPramide (REGLAN) 10 MG tablet        Follow-up Information   Follow up with [name] DAVID, MD. Schedule an appointment as soon as possible for a visit in 2 days.   Specialty:  Internal Medicine   Contact information:   [address] [place] [address] [phone]       Schedule an appointment as soon as possible for a visit with DETERDING,JAMES L, MD.   Specialty:  Nephrology   Contact information:   [place] [address] [phone]       Follow up with [name]., MD. Schedule an appointment as soon as possible for a visit in 1 week.   Specialty:  Oncology   Contact information:   [address] [place] [address] [phone]       TOTAL DISCHARGE TIME: 35 mins  [facility] Pager  [phone]  12/06/2012, 11:29 AM

## 2012-12-06 NOTE — Progress Notes (Signed)
Discharged patient to home, daughter at bedside.patient no complaints of any pain or discomfort upon discharge. D/c instructions done and discussed with patient . PIV removed no s/s  Of infiltration or swelling noted on insertion /sorrounding site.

## 2012-12-08 ENCOUNTER — Telehealth: Payer: Self-pay | Admitting: Internal Medicine

## 2012-12-08 ENCOUNTER — Ambulatory Visit: Payer: Self-pay | Admitting: Internal Medicine

## 2012-12-08 ENCOUNTER — Encounter: Payer: Self-pay | Admitting: Internal Medicine

## 2012-12-08 ENCOUNTER — Other Ambulatory Visit: Payer: Self-pay | Admitting: *Deleted

## 2012-12-08 VITALS — BP 114/60 | HR 72 | Temp 98.1°F | Resp 16 | Wt 187.2 lb

## 2012-12-08 DIAGNOSIS — I10 Essential (primary) hypertension: Secondary | ICD-10-CM

## 2012-12-08 DIAGNOSIS — N179 Acute kidney failure, unspecified: Secondary | ICD-10-CM

## 2012-12-08 DIAGNOSIS — Z79899 Other long term (current) drug therapy: Secondary | ICD-10-CM | POA: Diagnosis not present

## 2012-12-08 DIAGNOSIS — C9001 Multiple myeloma in remission: Secondary | ICD-10-CM

## 2012-12-08 DIAGNOSIS — Z853 Personal history of malignant neoplasm of breast: Secondary | ICD-10-CM | POA: Insufficient documentation

## 2012-12-08 NOTE — Patient Instructions (Signed)
Hypercalcemia Hypercalcemia means the calcium in your blood is too high. Calcium in our blood is important for the control of many things, such as:  Blood clotting.  Conducting of nerve impulses.  Muscle contraction.  Maintaining teeth and bone health.  Other body functions. In the bloodstream, calcium maintains a constant balance with another mineral, phosphate. Calcium is absorbed into the body through the small intestine. This is helped by Vitamin D. Calcium levels are maintained mostly by vitamin D and a hormone (parathyroid hormone). But the kidneys also help. Hypercalcemia can happen when the concentration of calcium is too high for the kidneys to maintain balance. The body maintains a balance between the calcium we eat and the calcium already in our body. If calcium intake is increased or we cannot use calcium properly, there may be problems. Some common sources of calcium are:   Dairy products.  Nuts.  Eggs.  Whole grains.  Legumes.  Green leafy vegetables. CAUSES There are many causes of this condition, but some common ones are:  Hyperparathyroidism. This is an over activity of the parathyroid gland.  Cancers of the breast, kidney, lung, head and neck are common causes of calcium increases.  Medications that cause you to urinate more often (diuretics), nausea, vomiting and diarrhea also increase the calcium in the blood.  Overuse of calcium-containing antacids. SYMPTOMS  Many patients with mild hypercalcemia have no symptoms. For those with symptoms common problems include:  Loss of appetite.  Constipation.  Increased thirst.  Heart rhythm changes.  Abnormal thinking.  Nausea.  Abdominal pain.  Kidney stones.  Mood swings.  Coma and death when severe.  Vomiting.  Increased urination.  High blood pressure.  Confusion. DIAGNOSIS   Your caregiver will do a medical history and perform a physical exam on you.  Calcium and parathyroid hormone  (PTH) may be measured with a blood test. TREATMENT   The treatment depends on the calcium level and what is causing the higher level. Hypercalcemia can be lifethreatening. Fast lowering of the calcium level may be necessary.  With normal kidney function, fluids can be given by vein to clear the excess calcium. Hemodialysis works well to reduce dangerous calcium levels if there is poor kidney function. This is a procedure in which a machine is used to filter out unwanted substances. The blood is then returned to the body.  Drugs, such as diuretics, can be given after adequate fluid intake is established. These medications help the kidneys get rid of extra calcium. Drugs that lessen (inhibit) bone loss are helpful in gaining long-term control. Phosphate pills help lower high calcium levels caused by a low supply of phosphate. Anti-inflammatory agents such as steroids are helpful with some cancers and toxic levels of vitamin D.  Treatment of the underlying cause of the hypercalcemia will also correct the imbalance. Hyperparathyroidism is usually treated by surgical removal of one or more of the parathyroid glands and any tissue, other than the glands themselves, that is producing too much hormone.  The hypercalcemia caused by cancer is difficult to treat without controlling the cancer. Symptoms can be improved with fluids and drug therapy as outlined above. PROGNOSIS   Surgery to remove the parathyroid glands is usually successful. This also depends on the amount of damage to the kidneys and whether or not it can be treated.  Mild hypercalcemia can be controlled with good fluid intake and the use of effective medications.  Hypercalcemia often develops as a late complication of cancer. The expected outlook   is poor without effective anticancer therapy. PREVENTION   If you are at risk for developing hypercalcemia, be familiar with early symptoms. Report these to your caregiver.  Good fluid intake  (up to four quarts of liquid a day if possible) is helpful.  Try to control nausea and vomiting, and treat fevers to avoid dehydration.  Lowering the amount of calcium in your diet is not necessary. High blood calcium reduces absorption of calcium in the intestine.  Stay as active as possible. SEEK IMMEDIATE MEDICAL CARE IF:   You develop chest pain, sweating, or shortness of breath.  You get confused, feel faint or pass out.  You develop severe nausea and vomiting. MAKE SURE YOU:   Understand these instructions.  Will watch your condition.  Will get help right away if you are not doing well or get worse. Document Released: 03/17/2004 Document Revised: 04/28/2012 Document Reviewed: 12/27/2009 [employer] Patient Information 2014 [employer].  

## 2012-12-08 NOTE — Telephone Encounter (Signed)
pt called to r/s appt...done °

## 2012-12-08 NOTE — Progress Notes (Signed)
Patient ID: Nancy Norris, female   DOB: 11-12-1940, 72 y.o.   MRN: [id number]   This very nice 72 yo DWF presents for post hospital f/u having been admitted with hypercalcemia and acute on chronic renal failure. Pertinent past history is that of Multiple Myeloma s/p autologous Stem Cell BM transplant. She also has hx/ breast cancer in remission. Per review of hospital records, patient was hydrated with IVF maintaining urine output with arrangements for outpatient follow- up with Dr Earlie Server for further evaluation. Also, referral to Nephrology was recommended in hospital prior to discharge. Since discharge, patient has complained of worse dependent ankle edema, but otherwise no major complaints.   Today's BP is 114/60. She denies any cardiac type chest pain, palpitations, dyspnea/orthopnea/PND, dizziness, or claudication symptoms.    Medication Sig Dispense Refill  . albuterol (PROVENTIL HFA;VENTOLIN HFA) 108 (90 BASE) MCG/ACT inhaler Inhale 2 puffs into the lungs 2 (two) times daily.      Marland Kitchen ascorbic acid (VITAMIN C) 250 MG CHEW Chew 250 mg by mouth daily.      . baclofen (LIORESAL) 10 MG tablet Take 1 tablet (10 mg total) by mouth daily.  60 tablet  1  . cetirizine (ZYRTEC) 10 MG tablet Take 10 mg by mouth at bedtime.       . Cholecalciferol (VITAMIN D3) 3000 UNITS TABS Take 10,000 Units by mouth every other day.       . citalopram (CELEXA) 40 MG tablet Take 40 mg by mouth daily.        . Cranberry 300 MG tablet Take 300 mg by mouth daily.      . cromolyn (OPTICROM) 4 % ophthalmic solution Place 1 drop into both eyes 4 (four) times daily.      . cyanocobalamin 2000 MCG tablet Take 3,000 mcg by mouth daily.      . Ferrous Sulfate 134 MG TABS Take by mouth daily.      . fludrocortisone (FLORINEF) 0.1 MG tablet Take 0.1 mg by mouth every other day.      . levothyroxine (SYNTHROID, LEVOTHROID) 150 MCG tablet Take 200 mcg by mouth daily. 225 mcg sat and sund      . loteprednol (LOTEMAX) 0.2 % SUSP 1  drop 3 (three) times daily. Both eyes      . OVER THE COUNTER MEDICATION Skin, hair and nail vitamin      . pantoprazole (PROTONIX) 40 MG tablet Take 40 mg by mouth daily.      . pregabalin (LYRICA) 50 MG capsule Take 50 mg by mouth 3 (three) times daily.      . sodium bicarbonate 650 MG tablet Take 1 tablet (650 mg total) by mouth 2 (two) times daily.  60 tablet  0  . traMADol (ULTRAM) 50 MG tablet Take 50 mg by mouth every 6 (six) hours as needed.         No current facility-administered medications on file prior to visit.     Allergies  Allergen Reactions  . Codeine Anaphylaxis  . Latex Shortness Of Breath    Adhesive products   . Iodinated Diagnostic Agents Itching    Happened 40 years ago  . Sulfa Antibiotics Itching    PMHx:   Past Medical History  Diagnosis Date  . Hyperkalemia   . Hypothyroidism   . COPD (chronic obstructive pulmonary disease)   . Hyponatremia   . Dizziness   . Fibromyalgia   . Breast cancer   . Multiple myeloma   . Mucositis  FHx:    Reviewed / unchanged  SHx:    Reviewed / unchanged  Systems Review: Constitutional: Denies fever, chills, wt changes, headaches, insomnia, fatigue, night sweats, change in appetite. Eyes: Denies redness, blurred vision, diplopia, discharge, itchy, watery eyes.  ENT: Denies discharge, congestion, post nasal drip, epistaxis, sore throat, earache, hearing loss, dental pain, tinnitus, vertigo, sinus pain, snoring.  CV: Denies chest pain, palpitations, irregular heartbeat, syncope, dyspnea, diaphoresis, orthopnea, PND, claudication. Respiratory: denies cough, dyspnea, DOE, pleurisy, hoarseness, laryngitis, wheezing.  Gastrointestinal: neg Genitourinary: Denies dysuria, frequency, urgency. Musculoskeletal: Denies arthralgias, myalgias, stiffness, jt. swelling, pain.  Skin: Denies pruritus, rash, hives. Neuro: No weakness, tremor, incoordination, spasms, paresthesia, or pain. Psychiatric: Denies confusion, memory  loss. Endo: Denies change in weight.  Heme/Lymph: No excessive bleeding, bruising, or enlarged lymph nodes.   12/08/12 1735  BP: 114/60  Pulse: 72  Temp: 98.1 F (36.7 C)  Resp: 16    Estimated body mass index is 31.15 kg/(m^2) as calculated from the following:   Height 5\' 5"  (1.651 m).  Weight as of this encounter: 187 lb 3.2 oz (84.913 kg).  On Exam: Appears well nourished - in no distress. Eyes: PERRLA, EOMs, conjunctiva no swelling or erythema. Sinuses: No frontal/maxillary tenderness ENT/Mouth: EAC's clear, TM's nl w/o erythema, bulging. Nares clear w/o erythema, swelling, exudates. Oropharynx clear without erythema or exudates. Oral hygiene is good. Tongue normal, non obstructing. Hearing intact.  Neck: Supple. Thyroid nl. Car 2+/2+ without bruits, nodes or JVD. Chest: Respirations nl with BS clear & equal w/o rales, rhonchi, wheezing or stridor.  Cor: Heart sounds normal w/ regular rate and rhythm without sig. murmurs, gallops, clicks, or rubs. Peripheral pulses normal and equal  without edema.  Abdomen: Soft & bowel sounds normal. Non-tender w/o guarding, rebound, hernias, masses, or organomegaly.  Lymphatics: Unremarkable.  Musculoskeletal: Full ROM all peripheral extremities, joint stability, 5/5 strength, and normal gait.  Skin: Warm, dry without exposed rashes, lesions, ecchymosis apparent.  Neuro: Cranial nerves intact, reflexes equal bilaterally. Sensory-motor testing grossly intact. Tendon reflexes grossly intact.  Pysch: Alert & oriented x 3. Insight and judgement nl & appropriate. No ideations.  Assessment and Plan:  1. Hypertension - Continue monitor blood pressure at home. Continue diet/meds same pending labs.  2. Hypercalcemia- ? Related to her MM.  Further disposition pending results of labs.  Referral made to Dr Deterding's office

## 2012-12-09 ENCOUNTER — Ambulatory Visit (HOSPITAL_BASED_OUTPATIENT_CLINIC_OR_DEPARTMENT_OTHER): Payer: Medicare Other

## 2012-12-09 ENCOUNTER — Encounter: Payer: Self-pay | Admitting: Internal Medicine

## 2012-12-09 ENCOUNTER — Other Ambulatory Visit (HOSPITAL_BASED_OUTPATIENT_CLINIC_OR_DEPARTMENT_OTHER): Payer: Medicare Other | Admitting: Lab

## 2012-12-09 ENCOUNTER — Ambulatory Visit (HOSPITAL_BASED_OUTPATIENT_CLINIC_OR_DEPARTMENT_OTHER): Payer: Medicare Other | Admitting: Internal Medicine

## 2012-12-09 ENCOUNTER — Other Ambulatory Visit: Payer: Self-pay | Admitting: Internal Medicine

## 2012-12-09 VITALS — BP 109/63 | HR 74 | Temp 97.7°F | Resp 18 | Ht 65.0 in | Wt 184.5 lb

## 2012-12-09 DIAGNOSIS — C9001 Multiple myeloma in remission: Secondary | ICD-10-CM

## 2012-12-09 DIAGNOSIS — Z853 Personal history of malignant neoplasm of breast: Secondary | ICD-10-CM | POA: Diagnosis not present

## 2012-12-09 DIAGNOSIS — E876 Hypokalemia: Secondary | ICD-10-CM

## 2012-12-09 DIAGNOSIS — K5900 Constipation, unspecified: Secondary | ICD-10-CM | POA: Diagnosis not present

## 2012-12-09 DIAGNOSIS — R5381 Other malaise: Secondary | ICD-10-CM

## 2012-12-09 LAB — CBC WITH DIFFERENTIAL/PLATELET
BASO%: 0.7 % (ref 0.0–2.0)
Basophils Relative: 0 % (ref 0–1)
Eosinophils Absolute: 0.2 10*3/uL (ref 0.0–0.7)
Eosinophils Relative: 2 % (ref 0–5)
LYMPH%: 29.7 % (ref 14.0–49.7)
Lymphs Abs: 3 10*3/uL (ref 0.7–4.0)
MCH: 31.3 pg (ref 26.0–34.0)
MCH: 30 pg (ref 25.1–34.0)
MCHC: 33.2 g/dL (ref 30.0–36.0)
MCHC: 31.6 g/dL (ref 31.5–36.0)
MCV: 94.2 fL (ref 78.0–100.0)
MCV: 95.1 fL (ref 79.5–101.0)
MONO#: 1 10*3/uL — ABNORMAL HIGH (ref 0.1–0.9)
MONO%: 11.3 % (ref 0.0–14.0)
Monocytes Relative: 10 % (ref 3–12)
Neutrophils Relative %: 62 % (ref 43–77)
Platelets: 256 10*3/uL (ref 150–400)
Platelets: 253 10*3/uL (ref 145–400)
RBC: 3.42 MIL/uL — ABNORMAL LOW (ref 3.87–5.11)
RBC: 3.2 10*6/uL — ABNORMAL LOW (ref 3.70–5.45)
RDW: 15 % (ref 11.5–15.5)
WBC: 11.9 10*3/uL — ABNORMAL HIGH (ref 4.0–10.5)
WBC: 8.4 10*3/uL (ref 3.9–10.3)

## 2012-12-09 LAB — BASIC METABOLIC PANEL WITH GFR
BUN: 17 mg/dL (ref 6–23)
CO2: 20 mEq/L (ref 19–32)
Calcium: 12.5 mg/dL — ABNORMAL HIGH (ref 8.4–10.5)
Chloride: 107 mEq/L (ref 96–112)
Creat: 3.15 mg/dL — ABNORMAL HIGH (ref 0.50–1.10)
GFR, Est African American: 16 mL/min — ABNORMAL LOW

## 2012-12-09 LAB — COMPREHENSIVE METABOLIC PANEL (CC13)
ALT: 28 U/L (ref 0–55)
Alkaline Phosphatase: 162 U/L — ABNORMAL HIGH (ref 40–150)
BUN: 18.2 mg/dL (ref 7.0–26.0)
Calcium: 12.4 mg/dL — ABNORMAL HIGH (ref 8.4–10.4)
Sodium: 142 mEq/L (ref 136–145)
Total Bilirubin: 0.87 mg/dL (ref 0.20–1.20)
Total Protein: 6.9 g/dL (ref 6.4–8.3)

## 2012-12-09 LAB — LACTATE DEHYDROGENASE (CC13): LDH: 130 U/L (ref 125–245)

## 2012-12-09 MED ORDER — POTASSIUM CHLORIDE 20 MEQ PO PACK: PACK | ORAL | Status: DC

## 2012-12-09 MED ORDER — ZOLEDRONIC ACID 4 MG/100ML IV SOLN
4.0000 mg | Freq: Once | INTRAVENOUS | Status: AC
  Administered 2012-12-09: 4 mg via INTRAVENOUS
  Filled 2012-12-09: qty 100

## 2012-12-09 MED ORDER — SODIUM CHLORIDE 0.9 % IV SOLN
Freq: Once | INTRAVENOUS | Status: AC
  Administered 2012-12-09: 10:00:00 via INTRAVENOUS

## 2012-12-09 NOTE — Patient Instructions (Signed)

## 2012-12-09 NOTE — Progress Notes (Signed)
[facility] [phone]   [phone]  OFFICE PROGRESS NOTE  PRINCIPAL DIAGNOSES:  1. Multiple myeloma diagnosed in 2002, initially with smoldering myeloma at [facility].  2. Ductal carcinoma in situ status post mastectomy with sentinel lymph node biopsy in October 2008.  PRIOR THERAPY:  1. Status post treatment with tamoxifen from November 2008 through February 2009, discontinued secondary to intolerance. 2. Status post 3 cycles of chemotherapy with Revlimid and Decadron followed by 1 cycle of Decadron only with mild response. 3. Status post 2 cycles of systemic chemotherapy with Velcade, Doxil and Decadron discontinued secondary to significant peripheral neuropathy. Last dose was given May 2010 at [facility]. 4. Status post autologous peripheral blood stem cell transplant on October 01, 2008 at [facility] under the care of Dr. Phyllis Ginger.  CURRENT THERAPY: Observation.  INTERVAL HISTORY: Nancy Norris 72 y.o. female returns to the clinic today for six-month followup visit. The patient is feeling fine today with no specific complaints except for fatigue. She was recently admitted to [facility] complaining of weakness with lower quadrant abdominal discomfort, dysuria and oliguria as well as constipation. She was treated for urinary tract infection. She was also found to have hypercalcemia treated with IV hydration. As of yesterday her calcium is still high at 12.5. She denied having any significant weight loss or night sweats. She has no chest pain, shortness of breath, cough or hemoptysis.  The patient had repeat myeloma panel performed earlier today and she is here for evaluation and discussion of her lab results.   MEDICAL HISTORY: Past Medical History  Diagnosis Date  . Hyperkalemia   . Hypothyroidism   . COPD (chronic obstructive pulmonary disease)   . Hyponatremia   . Dizziness   . Fibromyalgia     . Breast cancer   . Multiple myeloma   . Mucositis     ALLERGIES:  is allergic to codeine; latex; iodinated diagnostic agents; and sulfa antibiotics.  MEDICATIONS:  Current Outpatient Prescriptions  Medication Sig Dispense Refill  . acetaminophen (TYLENOL) 325 MG tablet Take 650 mg by mouth every 6 (six) hours as needed.      Marland Kitchen albuterol (PROVENTIL HFA;VENTOLIN HFA) 108 (90 BASE) MCG/ACT inhaler Inhale 2 puffs into the lungs 2 (two) times daily.      Marland Kitchen ascorbic acid (VITAMIN C) 250 MG CHEW Chew 250 mg by mouth daily.      . cetirizine (ZYRTEC) 10 MG tablet Take 10 mg by mouth at bedtime.       . Cholecalciferol (VITAMIN D3) 3000 UNITS TABS Take 10,000 Units by mouth every other day.       . citalopram (CELEXA) 40 MG tablet Take 40 mg by mouth daily.        . Cranberry 300 MG tablet Take 300 mg by mouth daily.      . cromolyn (OPTICROM) 4 % ophthalmic solution Place 1 drop into both eyes 4 (four) times daily.      . cyanocobalamin 2000 MCG tablet Take 3,000 mcg by mouth daily.      . Ferrous Sulfate 134 MG TABS Take by mouth. Every other day      . levothyroxine (SYNTHROID, LEVOTHROID) 150 MCG tablet Take 200 mcg by mouth daily. 225 mcg sat and sund      . loteprednol (LOTEMAX) 0.2 % SUSP 1 drop 3 (three) times daily. Both eyes      . OVER THE COUNTER  MEDICATION Skin, hair and nail vitamin      . pantoprazole (PROTONIX) 40 MG tablet Take 40 mg by mouth 2 (two) times daily.       . pregabalin (LYRICA) 50 MG capsule Take 50 mg by mouth daily.       . sodium bicarbonate 650 MG tablet Take 1 tablet (650 mg total) by mouth 2 (two) times daily.  60 tablet  0  . traMADol (ULTRAM) 50 MG tablet Take 50 mg by mouth every 6 (six) hours as needed.         No current facility-administered medications for this visit.    SURGICAL HISTORY:  Past Surgical History  Procedure Laterality Date  . History of port removal    . Status post stem cell transplant on September 28, 2008.    Marland Kitchen Abdominal  hysterectomy  1981  . Cholecystectomy  1971  . Mastectomy Left 2008  . Cataract extraction, bilateral      REVIEW OF SYSTEMS:  Constitutional: positive for fatigue Eyes: negative Ears, nose, mouth, throat, and face: negative Respiratory: negative Cardiovascular: negative Gastrointestinal: positive for constipation Genitourinary:negative Integument/breast: negative Hematologic/lymphatic: negative Musculoskeletal:negative Neurological: negative Behavioral/Psych: negative Endocrine: negative Allergic/Immunologic: negative   PHYSICAL EXAMINATION: General appearance: alert, cooperative and no distress Head: Normocephalic, without obvious abnormality, atraumatic Neck: no adenopathy Lymph nodes: Cervical, supraclavicular, and axillary nodes normal. Resp: clear to auscultation bilaterally Back: symmetric, no curvature. ROM normal. No CVA tenderness. Cardio: regular rate and rhythm, S1, S2 normal, no murmur, click, rub or gallop GI: soft, non-tender; bowel sounds normal; no masses,  no organomegaly Extremities: extremities normal, atraumatic, no cyanosis or edema Neurologic: Alert and oriented X 3, normal strength and tone. Normal symmetric reflexes. Normal coordination and gait  ECOG PERFORMANCE STATUS: 1 - Symptomatic but completely ambulatory  Blood pressure 109/63, pulse 74, temperature 97.7 F (36.5 C), temperature source Oral, resp. rate 18, height 5\' 5"  (1.651 m), weight 184 lb 8 oz (83.689 kg).  LABORATORY DATA: Lab Results  Component Value Date   WBC 8.4 12/09/2012   HGB 9.6* 12/09/2012   HCT 30.4* 12/09/2012   MCV 95.1 12/09/2012   PLT 253 12/09/2012      Chemistry      Component Value Date/Time   NA 139 12/08/2012 1806   NA 140 03/19/2012 0811   K 3.4* 12/08/2012 1806   K 3.7 03/19/2012 0811   CL 107 12/08/2012 1806   CL 105 03/19/2012 0811   CO2 20 12/08/2012 1806   CO2 25 03/19/2012 0811   BUN 17 12/08/2012 1806   BUN 26.8* 03/19/2012 0811   CREATININE 3.15*  12/08/2012 1806   CREATININE 3.48* 12/06/2012 0424   CREATININE 2.0* 03/19/2012 0811      Component Value Date/Time   CALCIUM 12.5* 12/08/2012 1806   CALCIUM 9.6 03/19/2012 0811   ALKPHOS 149* 12/01/2012 1609   ALKPHOS 160* 03/19/2012 0811   AST 27 12/01/2012 1609   AST 20 03/19/2012 0811   ALT 24 12/01/2012 1609   ALT 25 03/19/2012 0811   BILITOT 0.7 12/01/2012 1609   BILITOT 0.52 03/19/2012 0811       RADIOGRAPHIC STUDIES: No results found.  ASSESSMENT AND PLAN: This is a very pleasant 72 years old white female with history of multiple myeloma currently on observation as well as history of ductal carcinoma in situ. The patient is doing fine except for the increasing fatigue, hypercalcemia and constipation. Her myeloma panel is still pending I am concerned about progression of her  disease especially with the persistent hypercalcemia. I am awaiting the results of the multiple myeloma to see the patient will need systemic therapy soon. For hypercalcemia, I will arrange for the patient to receive Zometa 4 mg IV today. She was advised to increase her by mouth hydration. if no evidence for disease progression with multiple myeloma, I will arrange for the patient to have a bone scan to rule out any metastatic bone disease. She would have repeat CBC and comprehensive metabolic panel in one month for evaluation of her hypercalcemia again.  She was advised to call immediately she has any concerning symptoms in the interval. Her last mammogram was performed in February of 2014 and it was unremarkable. Her followup visit will be arranged based on the myeloma panel results. The patient agreed with the current plan. All questions were answered. The patient knows to call the clinic with any problems, questions or concerns. We can certainly see the patient much sooner if necessary.  I spent 15 minutes counseling the patient face to face. The total time spent in the appointment was 25 minutes.

## 2012-12-10 ENCOUNTER — Telehealth: Payer: Self-pay | Admitting: *Deleted

## 2012-12-10 ENCOUNTER — Other Ambulatory Visit: Payer: Self-pay | Admitting: Internal Medicine

## 2012-12-10 DIAGNOSIS — C9001 Multiple myeloma in remission: Secondary | ICD-10-CM

## 2012-12-10 LAB — KAPPA/LAMBDA LIGHT CHAINS
Kappa:Lambda Ratio: 8.82 — ABNORMAL HIGH (ref 0.26–1.65)
Lambda Free Lght Chn: 1.87 mg/dL (ref 0.57–2.63)

## 2012-12-10 LAB — IGG, IGA, IGM: IgM, Serum: 48 mg/dL — ABNORMAL LOW (ref 52–322)

## 2012-12-10 NOTE — Progress Notes (Signed)
Quick Note:  Call patient with the result. Her myeloma panel showed increased free kappa light chain, suggestive of recurrence of the multiple myeloma. I ordered a bone marrow biopsy and aspirate to be performed next week. I would see the patient back for followup visit in 2 weeks for discussion of her biopsy results and recommendation regarding treatment of her condition. ______

## 2012-12-10 NOTE — Telephone Encounter (Signed)
Message copied by Britt Bottom on Wed Dec 10, 2012  3:09 PM ------      Message from: Curt Bears      Created: Wed Dec 10, 2012  2:12 PM       Call patient with the result. Her myeloma panel showed increased free kappa light chain, suggestive of recurrence of the multiple myeloma. I ordered a bone marrow biopsy and aspirate to be performed next week. I would see the patient back for followup visit in 2 weeks for discussion of her biopsy results and recommendation regarding treatment of her condition. ------

## 2012-12-10 NOTE — Telephone Encounter (Signed)
Spoke with patient, she verbalized understanding and will wait to hear from the schedulers regarding her BMBX and f/u appt.  SLJ

## 2012-12-12 ENCOUNTER — Other Ambulatory Visit: Payer: Self-pay | Admitting: *Deleted

## 2012-12-12 ENCOUNTER — Encounter (HOSPITAL_COMMUNITY): Payer: Self-pay | Admitting: Pharmacy Technician

## 2012-12-12 ENCOUNTER — Telehealth: Payer: Self-pay | Admitting: Internal Medicine

## 2012-12-12 DIAGNOSIS — C9001 Multiple myeloma in remission: Secondary | ICD-10-CM

## 2012-12-12 DIAGNOSIS — R062 Wheezing: Secondary | ICD-10-CM

## 2012-12-12 MED ORDER — ALBUTEROL SULFATE HFA 108 (90 BASE) MCG/ACT IN AERS: 2.0000 | INHALATION_SPRAY | Freq: Two times a day (BID) | RESPIRATORY_TRACT | Status: DC | PRN

## 2012-12-12 NOTE — Telephone Encounter (Signed)
no vm available....mailed pt avs and letter

## 2012-12-15 ENCOUNTER — Other Ambulatory Visit (HOSPITAL_COMMUNITY): Payer: Self-pay | Admitting: Radiology

## 2012-12-15 HISTORY — PX: PORTACATH PLACEMENT: SHX2246

## 2012-12-17 ENCOUNTER — Ambulatory Visit (HOSPITAL_COMMUNITY)
Admission: RE | Admit: 2012-12-17 | Discharge: 2012-12-17 | Disposition: A | Discharge status: Home / Self Care | Payer: Medicare Other | Source: Ambulatory Visit | Attending: Internal Medicine | Admitting: Internal Medicine

## 2012-12-17 ENCOUNTER — Encounter (HOSPITAL_COMMUNITY): Payer: Self-pay

## 2012-12-17 DIAGNOSIS — Z87891 Personal history of nicotine dependence: Secondary | ICD-10-CM | POA: Insufficient documentation

## 2012-12-17 DIAGNOSIS — Z79899 Other long term (current) drug therapy: Secondary | ICD-10-CM | POA: Insufficient documentation

## 2012-12-17 DIAGNOSIS — Z9484 Stem cells transplant status: Secondary | ICD-10-CM | POA: Diagnosis not present

## 2012-12-17 DIAGNOSIS — Z853 Personal history of malignant neoplasm of breast: Secondary | ICD-10-CM | POA: Insufficient documentation

## 2012-12-17 DIAGNOSIS — D649 Anemia, unspecified: Secondary | ICD-10-CM | POA: Diagnosis not present

## 2012-12-17 DIAGNOSIS — D72822 Plasmacytosis: Secondary | ICD-10-CM | POA: Insufficient documentation

## 2012-12-17 DIAGNOSIS — Z9010 Acquired absence of unspecified breast and nipple: Secondary | ICD-10-CM | POA: Insufficient documentation

## 2012-12-17 DIAGNOSIS — E039 Hypothyroidism, unspecified: Secondary | ICD-10-CM | POA: Diagnosis not present

## 2012-12-17 DIAGNOSIS — C9000 Multiple myeloma not having achieved remission: Secondary | ICD-10-CM | POA: Insufficient documentation

## 2012-12-17 DIAGNOSIS — C9001 Multiple myeloma in remission: Secondary | ICD-10-CM

## 2012-12-17 DIAGNOSIS — J449 Chronic obstructive pulmonary disease, unspecified: Secondary | ICD-10-CM | POA: Insufficient documentation

## 2012-12-17 DIAGNOSIS — J4489 Other specified chronic obstructive pulmonary disease: Secondary | ICD-10-CM | POA: Insufficient documentation

## 2012-12-17 LAB — CBC
MCH: 31.7 pg (ref 26.0–34.0)
MCV: 94.3 fL (ref 78.0–100.0)
Platelets: 320 10*3/uL (ref 150–400)
RBC: 3.53 MIL/uL — ABNORMAL LOW (ref 3.87–5.11)
RDW: 15.6 % — ABNORMAL HIGH (ref 11.5–15.5)
WBC: 7.6 10*3/uL (ref 4.0–10.5)

## 2012-12-17 LAB — PROTIME-INR
INR: 0.9 (ref 0.00–1.49)
Prothrombin Time: 12 seconds (ref 11.6–15.2)

## 2012-12-17 LAB — APTT: aPTT: 31 seconds (ref 24–37)

## 2012-12-17 MED ORDER — SODIUM CHLORIDE 0.9 % IV SOLN
Freq: Once | INTRAVENOUS | Status: AC
  Administered 2012-12-17: 20 mL/h via INTRAVENOUS

## 2012-12-17 MED ORDER — FENTANYL CITRATE 0.05 MG/ML IJ SOLN
INTRAMUSCULAR | Status: AC | PRN
  Administered 2012-12-17: 25 ug via INTRAVENOUS
  Administered 2012-12-17: 50 ug via INTRAVENOUS

## 2012-12-17 MED ORDER — MIDAZOLAM HCL 2 MG/2ML IJ SOLN
INTRAMUSCULAR | Status: AC | PRN
  Administered 2012-12-17: 0.5 mg via INTRAVENOUS
  Administered 2012-12-17: 1 mg via INTRAVENOUS

## 2012-12-17 MED ORDER — MIDAZOLAM HCL 2 MG/2ML IJ SOLN
INTRAMUSCULAR | Status: AC
  Filled 2012-12-17: qty 6

## 2012-12-17 MED ORDER — FENTANYL CITRATE 0.05 MG/ML IJ SOLN
INTRAMUSCULAR | Status: AC
  Filled 2012-12-17: qty 6

## 2012-12-17 NOTE — H&P (Signed)
Chief Complaint: "I'm here for a bone marrow biopsy" Referring Physician:[name] HPI: ADRINNE Norris is an 72 y.o. female with hx of multiple myeloma. She is referred for CT guided bone marrow biopsy. She has had 4 previous procedures and done well. PMHx and meds reviewed. Pt feeling ok otherwise this morning.  Past Medical History:  Past Medical History  Diagnosis Date  . Hyperkalemia   . Hypothyroidism   . COPD (chronic obstructive pulmonary disease)   . Hyponatremia   . Dizziness   . Fibromyalgia   . Breast cancer   . Multiple myeloma   . Mucositis     Past Surgical History:  Past Surgical History  Procedure Laterality Date  . History of port removal    . Status post stem cell transplant on September 28, 2008.    Marland Kitchen Abdominal hysterectomy  1981  . Cholecystectomy  1971  . Mastectomy Left 2008  . Cataract extraction, bilateral      Family History:  Family History  Problem Relation Age of Onset  . Arthritis Mother   . Asthma Mother     Social History:  reports that she quit smoking about 6 years ago. Her smoking use included Cigarettes. She has a 30 pack-year smoking history. She has never used smokeless tobacco. She reports that she does not drink alcohol or use illicit drugs.  Allergies:  Allergies  Allergen Reactions  . Codeine Anaphylaxis  . Latex Shortness Of Breath    Adhesive products   . Iodinated Diagnostic Agents Itching    Happened 40 years ago  . Other     Onion, chocolate causes headache  . Sulfa Antibiotics Itching    Medications:   Medication List    ASK your doctor about these medications       acetaminophen 325 MG tablet  Commonly known as:  TYLENOL  Take 650 mg by mouth every 6 (six) hours as needed for mild pain or moderate pain.     albuterol 108 (90 BASE) MCG/ACT inhaler  Commonly known as:  PROVENTIL HFA;VENTOLIN HFA  Inhale 2 puffs into the lungs 2 (two) times daily as needed for wheezing or shortness of breath.     ascorbic  acid 250 MG Chew  Commonly known as:  VITAMIN C  Chew 250 mg by mouth daily.     cetirizine 10 MG tablet  Commonly known as:  ZYRTEC  Take 10 mg by mouth at bedtime.     citalopram 40 MG tablet  Commonly known as:  CELEXA  Take 40 mg by mouth daily with breakfast.     Cranberry 300 MG tablet  Take 300 mg by mouth daily.     cromolyn 4 % ophthalmic solution  Commonly known as:  OPTICROM  Place 1 drop into both eyes 4 (four) times daily.     cyanocobalamin 2000 MCG tablet  Take 3,000 mcg by mouth daily.     Ferrous Sulfate 134 MG Tabs  Take 1 tablet by mouth every other day. Every other day     levothyroxine 150 MCG tablet  Commonly known as:  SYNTHROID, LEVOTHROID  Take 150-225 mcg by mouth daily. 225 mcg sat and sund     loteprednol 0.2 % Susp  Commonly known as:  LOTEMAX  Place 1 drop into both eyes 3 (three) times daily. Both eyes     OVER THE COUNTER MEDICATION  Skin, hair and nail vitamin     pantoprazole 40 MG tablet  Commonly known as:  PROTONIX  Take 40 mg by mouth 2 (two) times daily.     potassium chloride 20 MEQ packet  Commonly known as:  KLOR-CON  Take 20 mEq by mouth daily.     pregabalin 50 MG capsule  Commonly known as:  LYRICA  Take 50 mg by mouth at bedtime.     traMADol 50 MG tablet  Commonly known as:  ULTRAM  Take 50 mg by mouth every 6 (six) hours as needed for moderate pain or severe pain.     Vitamin D3 3000 UNITS Tabs  Take 10,000 Units by mouth every other day.        Please HPI for pertinent positives, otherwise complete 10 system ROS negative.  Physical Exam: Temp: 98.2, HR: 86, BP: 115/61, RR: 20   General Appearance:  Alert, cooperative, no distress, appears stated age  Head:  Normocephalic, without obvious abnormality, atraumatic  ENT: Unremarkable  Neck: Supple, symmetrical, trachea midline  Lungs:   Clear to auscultation bilaterally, no w/r/r, respirations unlabored without use of accessory muscles.  Chest Wall:  No  tenderness or deformity  Heart:  Regular rate and rhythm, S1, S2 normal, no murmur, rub or gallop.  Neurologic: Normal affect, no gross deficits.   Results for orders placed during the hospital encounter of 12/17/12 (from the past 48 hour(s))  APTT     Status: None   Collection Time    12/17/12  7:18 AM      Result Value Range   aPTT 31  24 - 37 seconds  CBC     Status: Abnormal   Collection Time    12/17/12  7:18 AM      Result Value Range   WBC 7.6  4.0 - 10.5 K/uL   RBC 3.53 (*) 3.87 - 5.11 MIL/uL   Hemoglobin 11.2 (*) 12.0 - 15.0 g/dL   HCT 33.3 (*) 36.0 - 46.0 %   MCV 94.3  78.0 - 100.0 fL   MCH 31.7  26.0 - 34.0 pg   MCHC 33.6  30.0 - 36.0 g/dL   RDW 15.6 (*) 11.5 - 15.5 %   Platelets 320  150 - 400 K/uL  PROTIME-INR     Status: None   Collection Time    12/17/12  7:18 AM      Result Value Range   Prothrombin Time 12.0  11.6 - 15.2 seconds   INR 0.90  0.00 - 1.49   No results found.  Assessment/Plan Multiple myeloma For CT guided BM biopsy Discussed procedure risks, complications, use of sedation Labs reviewed, ok Consent signed in chart  Ascencion Dike PA-C 12/17/2012, 8:28 AM

## 2012-12-17 NOTE — Procedures (Signed)
Technically successful CT guided bone marrow aspiration and biopsy of left iliac crest. No immediate complications. Awaiting pathology report.    

## 2012-12-25 ENCOUNTER — Other Ambulatory Visit: Payer: Self-pay | Admitting: Medical Oncology

## 2012-12-25 ENCOUNTER — Encounter: Payer: Self-pay | Admitting: Internal Medicine

## 2012-12-25 ENCOUNTER — Telehealth: Payer: Self-pay | Admitting: *Deleted

## 2012-12-25 ENCOUNTER — Telehealth: Payer: Self-pay | Admitting: Internal Medicine

## 2012-12-25 ENCOUNTER — Ambulatory Visit (HOSPITAL_BASED_OUTPATIENT_CLINIC_OR_DEPARTMENT_OTHER): Payer: Medicare Other | Admitting: Internal Medicine

## 2012-12-25 ENCOUNTER — Other Ambulatory Visit (HOSPITAL_BASED_OUTPATIENT_CLINIC_OR_DEPARTMENT_OTHER): Payer: Medicare Other

## 2012-12-25 VITALS — BP 131/67 | HR 80 | Temp 97.8°F | Resp 18 | Ht 65.0 in | Wt 179.2 lb

## 2012-12-25 DIAGNOSIS — C9002 Multiple myeloma in relapse: Secondary | ICD-10-CM | POA: Diagnosis not present

## 2012-12-25 DIAGNOSIS — N19 Unspecified kidney failure: Secondary | ICD-10-CM | POA: Diagnosis not present

## 2012-12-25 DIAGNOSIS — D649 Anemia, unspecified: Secondary | ICD-10-CM

## 2012-12-25 DIAGNOSIS — C9001 Multiple myeloma in remission: Secondary | ICD-10-CM

## 2012-12-25 LAB — CBC WITH DIFFERENTIAL/PLATELET
BASO%: 1.2 % (ref 0.0–2.0)
EOS%: 4.6 % (ref 0.0–7.0)
MCH: 32.2 pg (ref 25.1–34.0)
MCHC: 33.6 g/dL (ref 31.5–36.0)
MCV: 95.9 fL (ref 79.5–101.0)
MONO%: 13.3 % (ref 0.0–14.0)
Platelets: 298 10*3/uL (ref 145–400)
RBC: 3.52 10*6/uL — ABNORMAL LOW (ref 3.70–5.45)
RDW: 14.6 % — ABNORMAL HIGH (ref 11.2–14.5)
lymph#: 2.6 10*3/uL (ref 0.9–3.3)

## 2012-12-25 LAB — COMPREHENSIVE METABOLIC PANEL (CC13)
ALT: 50 U/L (ref 0–55)
AST: 51 U/L — ABNORMAL HIGH (ref 5–34)
Albumin: 3.7 g/dL (ref 3.5–5.0)
Alkaline Phosphatase: 206 U/L — ABNORMAL HIGH (ref 40–150)
Anion Gap: 11 mEq/L (ref 3–11)
BUN: 35.5 mg/dL — ABNORMAL HIGH (ref 7.0–26.0)
Calcium: 10 mg/dL (ref 8.4–10.4)
Chloride: 115 mEq/L — ABNORMAL HIGH (ref 98–109)
Glucose: 98 mg/dl (ref 70–140)
Potassium: 3.9 mEq/L (ref 3.5–5.1)
Sodium: 142 mEq/L (ref 136–145)
Total Protein: 7.6 g/dL (ref 6.4–8.3)

## 2012-12-25 MED ORDER — DEXAMETHASONE 4 MG PO TABS: ORAL_TABLET | ORAL | Status: DC

## 2012-12-25 MED ORDER — PROCHLORPERAZINE MALEATE 10 MG PO TABS: 10.0000 mg | ORAL_TABLET | Freq: Four times a day (QID) | ORAL | Status: DC | PRN

## 2012-12-25 NOTE — Telephone Encounter (Signed)
Gave pt appt for lab,md and chemo for December and Janaury 2015

## 2012-12-25 NOTE — Telephone Encounter (Signed)
Per staff message and POF I have scheduled appts.  JMW  

## 2012-12-25 NOTE — Progress Notes (Signed)
[facility] [phone]   [phone]   OFFICE PROGRESS NOTE  PRINCIPAL DIAGNOSES:  1. Recurrent multiple myeloma initially diagnosed in 2002, initially with smoldering myeloma at [facility].  2. Ductal carcinoma in situ status post mastectomy with sentinel lymph node biopsy in October 2008.  PRIOR THERAPY:  1. Status post treatment with tamoxifen from November 2008 through February 2009, discontinued secondary to intolerance. 2. Status post 3 cycles of chemotherapy with Revlimid and Decadron followed by 1 cycle of Decadron only with mild response. 3. Status post 2 cycles of systemic chemotherapy with Velcade, Doxil and Decadron discontinued secondary to significant peripheral neuropathy. Last dose was given May 2010 at [facility]. 4. Status post autologous peripheral blood stem cell transplant on October 01, 2008 at [facility] under the care of Dr. Phyllis Ginger.  CURRENT THERAPY: Systemic chemotherapy with Carfilzomib initially was 20 mg/M2 and will be increased after cycle #1 to 36 mg/M2 on days 1, 2, 8, 9, 15 and 16 every 4 weeks in addition to Cytoxan 300 mg/M2 and Decadron 40 mg by mouth weekly basis. First cycle on 12/29/2012.   INTERVAL HISTORY: Nancy Norris 72 y.o. female returns to the clinic today for followup visit accompanied by her daughter. The patient is feeling fine today with no specific complaints except for fatigue. She was recently diagnosed with renal failure in addition to hypercalcemia and persistent anemia. Her multiple myeloma panel showed questionable disease recurrence. I ordered bone marrow biopsy and aspirate and the patient is here today for evaluation and discussion of her biopsy results as well as treatment options. She was treated with aggressive IV hydration as well as Zometa for the hypercalcemia. She denied having any significant weight loss or night sweats. She has no chest pain, shortness  of breath, cough or hemoptysis. She was referred by her primary care physician to Dr. Jimmy Footman for evaluation of her renal insufficiency but she has not seen him yet.   MEDICAL HISTORY: Past Medical History  Diagnosis Date  . Hyperkalemia   . Hypothyroidism   . COPD (chronic obstructive pulmonary disease)   . Hyponatremia   . Dizziness   . Fibromyalgia   . Breast cancer   . Multiple myeloma   . Mucositis     ALLERGIES:  is allergic to codeine; latex; iodinated diagnostic agents; other; and sulfa antibiotics.  MEDICATIONS:  Current Outpatient Prescriptions  Medication Sig Dispense Refill  . albuterol (PROVENTIL HFA;VENTOLIN HFA) 108 (90 BASE) MCG/ACT inhaler Inhale 2 puffs into the lungs 2 (two) times daily as needed for wheezing or shortness of breath.      Marland Kitchen ascorbic acid (VITAMIN C) 250 MG CHEW Chew 250 mg by mouth daily.      . cetirizine (ZYRTEC) 10 MG tablet Take 10 mg by mouth at bedtime.       . Cholecalciferol (VITAMIN D3) 3000 UNITS TABS Take 10,000 Units by mouth every other day.       . citalopram (CELEXA) 40 MG tablet Take 40 mg by mouth daily with breakfast.       . Cranberry 300 MG tablet Take 300 mg by mouth daily.      . cromolyn (OPTICROM) 4 % ophthalmic solution Place 1 drop into both eyes 4 (four) times daily.      . cyanocobalamin 2000 MCG tablet Take 3,000 mcg by mouth daily.      . Ferrous Sulfate 134 MG TABS Take 1 tablet by mouth  every other day. Every other day      . levothyroxine (SYNTHROID, LEVOTHROID) 150 MCG tablet Take 150-225 mcg by mouth daily. 225 mcg sat and sund      . loteprednol (LOTEMAX) 0.2 % SUSP Place 1 drop into both eyes 3 (three) times daily. Both eyes      . OVER THE COUNTER MEDICATION Skin, hair and nail vitamin      . pantoprazole (PROTONIX) 40 MG tablet Take 40 mg by mouth 2 (two) times daily.       . potassium chloride (KLOR-CON) 20 MEQ packet Take 20 mEq by mouth daily.      . pregabalin (LYRICA) 50 MG capsule Take 50 mg by mouth  at bedtime.       Marland Kitchen acetaminophen (TYLENOL) 325 MG tablet Take 650 mg by mouth every 6 (six) hours as needed for mild pain or moderate pain.        No current facility-administered medications for this visit.    SURGICAL HISTORY:  Past Surgical History  Procedure Laterality Date  . History of port removal    . Status post stem cell transplant on September 28, 2008.    Marland Kitchen Abdominal hysterectomy  1981  . Cholecystectomy  1971  . Mastectomy Left 2008  . Cataract extraction, bilateral      REVIEW OF SYSTEMS:  Constitutional: positive for fatigue Eyes: negative Ears, nose, mouth, throat, and face: negative Respiratory: negative Cardiovascular: negative Gastrointestinal: positive for constipation Genitourinary:negative Integument/breast: negative Hematologic/lymphatic: negative Musculoskeletal:negative Neurological: negative Behavioral/Psych: negative Endocrine: negative Allergic/Immunologic: negative   PHYSICAL EXAMINATION: General appearance: alert, cooperative and no distress Head: Normocephalic, without obvious abnormality, atraumatic Neck: no adenopathy Lymph nodes: Cervical, supraclavicular, and axillary nodes normal. Resp: clear to auscultation bilaterally Back: symmetric, no curvature. ROM normal. No CVA tenderness. Cardio: regular rate and rhythm, S1, S2 normal, no murmur, click, rub or gallop GI: soft, non-tender; bowel sounds normal; no masses,  no organomegaly Extremities: extremities normal, atraumatic, no cyanosis or edema Neurologic: Alert and oriented X 3, normal strength and tone. Normal symmetric reflexes. Normal coordination and gait  ECOG PERFORMANCE STATUS: 1 - Symptomatic but completely ambulatory  Blood pressure 131/67, pulse 80, temperature 97.8 F (36.6 C), temperature source Oral, resp. rate 18, height 5\' 5"  (1.651 m), weight 179 lb 3.2 oz (81.285 kg).  LABORATORY DATA: Lab Results  Component Value Date   WBC 8.0 12/25/2012   HGB 11.3* 12/25/2012    HCT 33.7* 12/25/2012   MCV 95.9 12/25/2012   PLT 298 12/25/2012      Chemistry      Component Value Date/Time   NA 142 12/09/2012 0755   NA 139 12/08/2012 1806   K 3.0* 12/09/2012 0755   K 3.4* 12/08/2012 1806   CL 107 12/08/2012 1806   CL 105 03/19/2012 0811   CO2 20* 12/09/2012 0755   CO2 20 12/08/2012 1806   BUN 18.2 12/09/2012 0755   BUN 17 12/08/2012 1806   CREATININE 3.7* 12/09/2012 0755   CREATININE 3.15* 12/08/2012 1806   CREATININE 3.48* 12/06/2012 0424      Component Value Date/Time   CALCIUM 12.4* 12/09/2012 0755   CALCIUM 12.5* 12/08/2012 1806   ALKPHOS 162* 12/09/2012 0755   ALKPHOS 149* 12/01/2012 1609   AST 26 12/09/2012 0755   AST 27 12/01/2012 1609   ALT 28 12/09/2012 0755   ALT 24 12/01/2012 1609   BILITOT 0.87 12/09/2012 0755   BILITOT 0.7 12/01/2012 1609       RADIOGRAPHIC  STUDIES: Dg Chest 2 View  12/02/2012   CLINICAL DATA:  Abdominal pain, former smoker, history COPD, breast cancer, multiple myeloma  EXAM: CHEST  2 VIEW  COMPARISON:  08/22/2009  FINDINGS: Normal heart size and pulmonary vascularity.  Tortuous thoracic aorta with atherosclerotic calcification.  Emphysematous and bronchitic changes consistent with COPD.  Minimal atelectasis or scarring at lung bases.  No definite acute infiltrate, pleural effusion or pneumothorax.  Prior left breast surgery.  Osseous demineralization.  IMPRESSION: COPD changes with minimal atelectasis or scarring at lung bases.  Remaining lungs clear.   Electronically Signed   By: Lavonia Dana M.D.   On: 12/02/2012 11:21   US Renal  12/02/2012   CLINICAL DATA:  Renal failure.  EXAM: RENAL/URINARY TRACT ULTRASOUND COMPLETE  COMPARISON:  07/20/2010.  FINDINGS: Right Kidney  Length: 8.5 cm.  Renal parenchyma is echogenic and irregular.  Left Kidney  Length: 11.4 cm.  Renal parenchyma is echogenic and irregular.  Bladder  Appears normal for degree of bladder distention.  IMPRESSION: Echogenic renal parenchyma with thick  irregular contour consistent with chronic medical renal disease. No hydronephrosis. Bladder nondistended.   Electronically Signed   By: Marcello Moores  Register   On: 12/02/2012 10:46   Ct Biopsy  12/17/2012   INDICATION: Multiple myeloma  EXAM: CT BIOPSY  MEDICATIONS: Fentanyl 75 mcg IV; Versed 1.5 mg IV  ANESTHESIA/SEDATION: Sedation Time  10 minutes  CONTRAST:  None  PROCEDURE: Informed consent was obtained from the patient following an explanation of the procedure, risks, benefits and alternatives. The patient understands, agrees and consents for the procedure. All questions were addressed. A time out was performed prior to the initiation of the procedure. The patient was positioned prone and noncontrast localization CT was performed of the pelvis to demonstrate the iliac marrow spaces. The operative site was prepped and draped in the usual sterile fashion.  Under sterile conditions and local anesthesia, an 11 gauge coaxial bone biopsy needle was advanced into the left iliac marrow space. Needle position was confirmed with CT imaging. Initially, bone marrow aspiration was performed. Next, a bone marrow biopsy was obtained with the 11 gauge outer bone marrow device. Samples were prepared with the cytotechnologist and deemed adequate. The needle was removed intact. Hemostasis was obtained with compression and a dressing was placed. The patient tolerated the procedure well without immediate post procedural complication.  COMPLICATIONS: None immediate.  IMPRESSION: Successful CT guided left iliac bone marrow aspiration and core biopsies.   Electronically Signed   By: Sandi Mariscal M.D.   On: 12/17/2012 12:26   BONE MARROW REPORT FINAL DIAGNOSIS Diagnosis Bone Marrow, Aspirate,Biopsy, and Clot, left iliac - HYPERCELLULAR MARROW WITH MONOCLONAL PLASMACYTOSIS (10%). - SEE COMMENT. PERIPHERAL BLOOD: - NORMOCYTIC ANEMIA. Diagnosis Note The bone marrow is hypercellular with increased plasma cells (10% overall) with  frequent small clusters. The plasma cells show a kappa predominance by immunohistochemistry. These findings are consistent with persistence/recurrence of the patient's prior plasma cell neoplasm. Vicente Males MD Pathologist, Electronic Signature (Case signed 12/19/2012)  ASSESSMENT AND PLAN: This is a very pleasant 72 years old white female with recurrent multiple myeloma as well as history of ductal carcinoma in situ. Her recent myeloma panel in addition to the bone marrow biopsy and aspirate showed recurrence of her disease. I have a lengthy discussion with the patient and her daughter today about her condition and treatment options. The patient is currently asymptomatic and has several findings to indicate treatment of her condition including worsening renal function,  hypercalcemia and anemia. I discussed with the patient her treatment options including systemic therapy with Carfilzomib, Cytoxan and Decadron. I discussed with the patient adverse effect of this treatment including but not limited to alopecia, myelosuppression, nausea and vomiting, peripheral neuropathy, liver, renal or cardiac dysfunction. I will arrange for the patient to have a 2-D echo before starting the first cycle of her treatment to rule out any cardiac dysfunction. For hypercalcemia, the patient will continue on monthly Zometa. For the renal insufficiency, she was advised to keep her appointment with Dr. Jimmy Footman and to increase her by mouth hydration.  The patient agreed to the current plan. She is expected to start the first cycle of the chemotherapy on 12/29/2012. She was advised to call immediately she has any concerning symptoms in the interval. Her last mammogram was performed in February of 2014 and it was unremarkable. She would come back for follow up visit in 2 weeks for evaluation and management any adverse effect of her treatment. She was advised to call immediately if she has any concerning symptoms in the  interval. All questions were answered. The patient knows to call the clinic with any problems, questions or concerns. We can certainly see the patient much sooner if necessary.  I spent 35 minutes counseling the patient face to face. The total time spent in the appointment was 45 minutes.

## 2012-12-25 NOTE — Telephone Encounter (Signed)
s.w. pt and advised on echo for tomorrow 9am

## 2012-12-26 ENCOUNTER — Ambulatory Visit (HOSPITAL_COMMUNITY)
Admission: RE | Admit: 2012-12-26 | Discharge: 2012-12-26 | Disposition: A | Discharge status: Home / Self Care | Payer: Medicare Other | Source: Ambulatory Visit | Attending: Internal Medicine | Admitting: Internal Medicine

## 2012-12-26 DIAGNOSIS — Z87891 Personal history of nicotine dependence: Secondary | ICD-10-CM | POA: Diagnosis not present

## 2012-12-26 DIAGNOSIS — C9000 Multiple myeloma not having achieved remission: Secondary | ICD-10-CM | POA: Diagnosis not present

## 2012-12-26 DIAGNOSIS — Z853 Personal history of malignant neoplasm of breast: Secondary | ICD-10-CM | POA: Insufficient documentation

## 2012-12-26 DIAGNOSIS — N179 Acute kidney failure, unspecified: Secondary | ICD-10-CM | POA: Diagnosis not present

## 2012-12-26 DIAGNOSIS — C9002 Multiple myeloma in relapse: Secondary | ICD-10-CM

## 2012-12-26 DIAGNOSIS — E876 Hypokalemia: Secondary | ICD-10-CM | POA: Insufficient documentation

## 2012-12-26 DIAGNOSIS — Z01818 Encounter for other preprocedural examination: Secondary | ICD-10-CM | POA: Diagnosis not present

## 2012-12-26 DIAGNOSIS — N189 Chronic kidney disease, unspecified: Secondary | ICD-10-CM | POA: Insufficient documentation

## 2012-12-26 DIAGNOSIS — I519 Heart disease, unspecified: Secondary | ICD-10-CM | POA: Diagnosis not present

## 2012-12-26 NOTE — Progress Notes (Signed)
  Echocardiogram 2D Echocardiogram has been performed.  [place], [place] 12/26/2012, 10:02 AM

## 2012-12-29 ENCOUNTER — Ambulatory Visit (HOSPITAL_BASED_OUTPATIENT_CLINIC_OR_DEPARTMENT_OTHER): Payer: Medicare Other

## 2012-12-29 ENCOUNTER — Other Ambulatory Visit (HOSPITAL_BASED_OUTPATIENT_CLINIC_OR_DEPARTMENT_OTHER): Payer: Medicare Other

## 2012-12-29 VITALS — BP 118/65 | HR 70 | Temp 98.9°F

## 2012-12-29 DIAGNOSIS — Z5111 Encounter for antineoplastic chemotherapy: Secondary | ICD-10-CM

## 2012-12-29 DIAGNOSIS — C9002 Multiple myeloma in relapse: Secondary | ICD-10-CM

## 2012-12-29 DIAGNOSIS — Z5112 Encounter for antineoplastic immunotherapy: Secondary | ICD-10-CM

## 2012-12-29 LAB — CBC WITH DIFFERENTIAL/PLATELET
Basophils Absolute: 0.1 10*3/uL (ref 0.0–0.1)
EOS%: 4.4 % (ref 0.0–7.0)
Eosinophils Absolute: 0.3 10*3/uL (ref 0.0–0.5)
HGB: 10.6 g/dL — ABNORMAL LOW (ref 11.6–15.9)
LYMPH%: 27 % (ref 14.0–49.7)
MCH: 32.5 pg (ref 25.1–34.0)
MCV: 96.8 fL (ref 79.5–101.0)
MONO#: 0.7 10*3/uL (ref 0.1–0.9)
MONO%: 9.7 % (ref 0.0–14.0)
NEUT#: 3.9 10*3/uL (ref 1.5–6.5)
Platelets: 250 10*3/uL (ref 145–400)
RBC: 3.26 10*6/uL — ABNORMAL LOW (ref 3.70–5.45)

## 2012-12-29 LAB — COMPREHENSIVE METABOLIC PANEL (CC13)
ALT: 53 U/L (ref 0–55)
Albumin: 3.5 g/dL (ref 3.5–5.0)
Anion Gap: 10 mEq/L (ref 3–11)
CO2: 17 mEq/L — ABNORMAL LOW (ref 22–29)
Calcium: 8.9 mg/dL (ref 8.4–10.4)
Chloride: 114 mEq/L — ABNORMAL HIGH (ref 98–109)
Creatinine: 4.4 mg/dL (ref 0.6–1.1)
Potassium: 3.7 mEq/L (ref 3.5–5.1)

## 2012-12-29 LAB — TISSUE HYBRIDIZATION (BONE MARROW)-NCBH

## 2012-12-29 LAB — CHROMOSOME ANALYSIS, BONE MARROW

## 2012-12-29 MED ORDER — SODIUM CHLORIDE 0.9 % IV SOLN
Freq: Once | INTRAVENOUS | Status: AC
  Administered 2012-12-29: 10:00:00 via INTRAVENOUS

## 2012-12-29 MED ORDER — SODIUM CHLORIDE 0.9 % IV SOLN
300.0000 mg/m2 | Freq: Once | INTRAVENOUS | Status: AC
  Administered 2012-12-29: 580 mg via INTRAVENOUS
  Filled 2012-12-29: qty 29

## 2012-12-29 MED ORDER — DEXAMETHASONE SODIUM PHOSPHATE 10 MG/ML IJ SOLN
INTRAMUSCULAR | Status: AC
  Filled 2012-12-29: qty 1

## 2012-12-29 MED ORDER — DEXAMETHASONE SODIUM PHOSPHATE 10 MG/ML IJ SOLN
10.0000 mg | Freq: Once | INTRAMUSCULAR | Status: AC
  Administered 2012-12-29: 10 mg via INTRAVENOUS

## 2012-12-29 MED ORDER — ONDANSETRON 8 MG/50ML IVPB (CHCC)
8.0000 mg | Freq: Once | INTRAVENOUS | Status: AC
  Administered 2012-12-29: 8 mg via INTRAVENOUS

## 2012-12-29 MED ORDER — DEXTROSE 5 % IV SOLN
20.0000 mg/m2 | Freq: Once | INTRAVENOUS | Status: AC
  Administered 2012-12-29: 38 mg via INTRAVENOUS
  Filled 2012-12-29: qty 19

## 2012-12-29 MED ORDER — SODIUM CHLORIDE 0.9 % IV SOLN
Freq: Once | INTRAVENOUS | Status: AC
Start: 2012-12-29 — End: 2012-12-29
  Administered 2012-12-29: 10:00:00 via INTRAVENOUS

## 2012-12-29 MED ORDER — ONDANSETRON 8 MG/NS 50 ML IVPB
INTRAVENOUS | Status: AC
  Filled 2012-12-29: qty 8

## 2012-12-29 NOTE — Patient Instructions (Signed)
[facility] Discharge Instructions for Patients Receiving Chemotherapy  Today you received the following chemotherapy agents Cytoxan, Kyprolis.   To help prevent nausea and vomiting after your treatment, we encourage you to take your nausea medication as prescribed.    If you develop nausea and vomiting that is not controlled by your nausea medication, call the clinic.   BELOW ARE SYMPTOMS THAT SHOULD BE REPORTED IMMEDIATELY:  *FEVER GREATER THAN 100.5 F  *CHILLS WITH OR WITHOUT FEVER  NAUSEA AND VOMITING THAT IS NOT CONTROLLED WITH YOUR NAUSEA MEDICATION  *UNUSUAL SHORTNESS OF BREATH  *UNUSUAL BRUISING OR BLEEDING  TENDERNESS IN MOUTH AND THROAT WITH OR WITHOUT PRESENCE OF ULCERS  *URINARY PROBLEMS  *BOWEL PROBLEMS  UNUSUAL RASH Items with * indicate a potential emergency and should be followed up as soon as possible.  Feel free to call the clinic you have any questions or concerns. The clinic phone number is [phone].  Cyclophosphamide injection  (Cytoxin) What is this medicine? CYCLOPHOSPHAMIDE (sye kloe FOSS fa mide) is a chemotherapy drug. It slows the growth of cancer cells. This medicine is used to treat many types of cancer like lymphoma, myeloma, leukemia, breast cancer, and ovarian cancer, to name a few. This medicine may be used for other purposes; ask your health care provider or pharmacist if you have questions. COMMON BRAND NAME(S): Cytoxan, Neosar What should I tell my health care provider before I take this medicine? They need to know if you have any of these conditions: -blood disorders -history of other chemotherapy -infection -kidney disease -liver disease -recent or ongoing radiation therapy -tumors in the bone marrow -an unusual or allergic reaction to cyclophosphamide, other chemotherapy, other medicines, foods, dyes, or preservatives -pregnant or trying to get pregnant -breast-feeding How should I use this medicine? This  drug is usually given as an injection into a vein or muscle or by infusion into a vein. It is administered in a hospital or clinic by a specially trained health care professional. Talk to your pediatrician regarding the use of this medicine in children. Special care may be needed. Overdosage: If you think you have taken too much of this medicine contact a poison control center or emergency room at once. NOTE: This medicine is only for you. Do not share this medicine with others. What if I miss a dose? It is important not to miss your dose. Call your doctor or health care professional if you are unable to keep an appointment. What may interact with this medicine? This medicine may interact with the following medications: -amiodarone -amphotericin B -azathioprine -certain antiviral medicines for HIV or AIDS such as protease inhibitors (e.g., indinavir, ritonavir) and zidovudine -certain blood pressure medications such as benazepril, captopril, enalapril, fosinopril, lisinopril, moexipril, monopril, perindopril, quinapril, ramipril, trandolapril -certain cancer medications such as anthracyclines (e.g., daunorubicin, doxorubicin), busulfan, cytarabine, paclitaxel, pentostatin, tamoxifen, trastuzumab -certain diuretics such as chlorothiazide, chlorthalidone, hydrochlorothiazide, indapamide, metolazone -certain medicines that treat or prevent blood clots like warfarin -certain muscle relaxants such as succinylcholine -cyclosporine -etanercept -indomethacin -medicines to increase blood counts like filgrastim, pegfilgrastim, sargramostim -medicines used as general anesthesia -metronidazole -natalizumab This list may not describe all possible interactions. Give your health care provider a list of all the medicines, herbs, non-prescription drugs, or dietary supplements you use. Also tell them if you smoke, drink alcohol, or use illegal drugs. Some items may interact with your medicine. What should I  watch for while using this medicine? Visit your doctor for checks on your progress. This  drug may make you feel generally unwell. This is not uncommon, as chemotherapy can affect healthy cells as well as cancer cells. Report any side effects. Continue your course of treatment even though you feel ill unless your doctor tells you to stop. Drink water or other fluids as directed. Urinate often, even at night. In some cases, you may be given additional medicines to help with side effects. Follow all directions for their use. Call your doctor or health care professional for advice if you get a fever, chills or sore throat, or other symptoms of a cold or flu. Do not treat yourself. This drug decreases your body's ability to fight infections. Try to avoid being around people who are sick. This medicine may increase your risk to bruise or bleed. Call your doctor or health care professional if you notice any unusual bleeding. Be careful brushing and flossing your teeth or using a toothpick because you may get an infection or bleed more easily. If you have any dental work done, tell your dentist you are receiving this medicine. You may get drowsy or dizzy. Do not drive, use machinery, or do anything that needs mental alertness until you know how this medicine affects you. Do not become pregnant while taking this medicine or for 1 year after stopping it. Women should inform their doctor if they wish to become pregnant or think they might be pregnant. Men should not father a child while taking this medicine and for 4 months after stopping it. There is a potential for serious side effects to an unborn child. Talk to your health care professional or pharmacist for more information. Do not breast-feed an infant while taking this medicine. This medicine may interfere with the ability to have a child. This medicine has caused ovarian failure in some women. This medicine has caused reduced sperm counts in some men. You  should talk with your doctor or health care professional if you are concerned about your fertility. If you are going to have surgery, tell your doctor or health care professional that you have taken this medicine. What side effects may I notice from receiving this medicine? Side effects that you should report to your doctor or health care professional as soon as possible: -allergic reactions like skin rash, itching or hives, swelling of the face, lips, or tongue -low blood counts - this medicine may decrease the number of white blood cells, red blood cells and platelets. You may be at increased risk for infections and bleeding. -signs of infection - fever or chills, cough, sore throat, pain or difficulty passing urine -signs of decreased platelets or bleeding - bruising, pinpoint red spots on the skin, black, tarry stools, blood in the urine -signs of decreased red blood cells - unusually weak or tired, fainting spells, lightheadedness -breathing problems -dark urine -dizziness -palpitations -swelling of the ankles, feet, hands -trouble passing urine or change in the amount of urine -weight gain -yellowing of the eyes or skin Side effects that usually do not require medical attention (report to your doctor or health care professional if they continue or are bothersome): -changes in nail or skin color -hair loss -missed menstrual periods -mouth sores -nausea, vomiting This list may not describe all possible side effects. Call your doctor for medical advice about side effects. You may report side effects to FDA at 1-800-FDA-1088. Where should I keep my medicine? This drug is given in a hospital or clinic and will not be stored at home. NOTE: This sheet is a  summary. It may not cover all possible information. If you have questions about this medicine, talk to your doctor, pharmacist, or health care provider.  2014, Elsevier/Gold Standard. (2011-11-16 16:22:58)  Carfilzomib injection   (Kyprolis) What is this medicine? CARFILZOMIB (kar FILZ oh mib) is a chemotherapy drug that works by slowing or stopping cancer cell growth. This medicine is used to treat multiple myeloma. This medicine may be used for other purposes; ask your health care provider or pharmacist if you have questions. COMMON BRAND NAME(S): KYPROLIS What should I tell my health care provider before I take this medicine? They need to know if you have any of these conditions: -heart disease -irregular heartbeat -liver disease -lung or breathing disease -an unusual or allergic reaction to carfilzomib, or other medicines, foods, dyes, or preservatives -pregnant or trying to get pregnant -breast-feeding How should I use this medicine? This medicine is for injection or infusion into a vein. It is given by a health care professional in a hospital or clinic setting. Talk to your pediatrician regarding the use of this medicine in children. Special care may be needed. Overdosage: If you think you've taken too much of this medicine contact a poison control center or emergency room at once. Overdosage: If you think you have taken too much of this medicine contact a poison control center or emergency room at once. NOTE: This medicine is only for you. Do not share this medicine with others. What if I miss a dose? It is important not to miss your dose. Call your doctor or health care professional if you are unable to keep an appointment. What may interact with this medicine? Interactions are not expected. Give your health care provider a list of all the medicines, herbs, non-prescription drugs, or dietary supplements you use. Also tell them if you smoke, drink alcohol, or use illegal drugs. Some items may interact with your medicine. This list may not describe all possible interactions. Give your health care provider a list of all the medicines, herbs, non-prescription drugs, or dietary supplements you use. Also tell them  if you smoke, drink alcohol, or use illegal drugs. Some items may interact with your medicine. What should I watch for while using this medicine? Your condition will be monitored carefully while you are receiving this medicine. Report any side effects. Continue your course of treatment even though you feel ill unless your doctor tells you to stop. Call your doctor or health care professional for advice if you get a fever, chills or sore throat, or other symptoms of a cold or flu. Do not treat yourself. Try to avoid being around people who are sick. Do not become pregnant while taking this medicine. Women should inform their doctor if they wish to become pregnant or think they might be pregnant. There is a potential for serious side effects to an unborn child. Talk to your health care professional or pharmacist for more information. Do not breast-feed an infant while taking this medicine. Check with your doctor or health care professional if you get an attack of severe diarrhea, nausea and vomiting, or if you sweat a lot. The loss of too much body fluid can make it dangerous for you to take this medicine. You may get dizzy. Do not drive, use machinery, or do anything that needs mental alertness until you know how this medicine affects you. Do not stand or sit up quickly, especially if you are an older patient. This reduces the risk of dizzy or fainting spells. What  side effects may I notice from receiving this medicine? Side effects that you should report to your doctor or health care professional as soon as possible: -allergic reactions like skin rash, itching or hives, swelling of the face, lips, or tongue -breathing problems -chest pain or palpitationschest tightness -cough -dark urine -dizziness -feeling faint or lightheaded -fever or chills -general ill feeling or flu-like symptoms -light-colored stools -palpitations -right upper belly pain -swelling of the legs or ankles -unusual bleeding  or bruising -unusually weak or tired -yellowing of the eyes or skin  Side effects that usually do not require medical attention (Report these to your doctor or health care professional if they continue or are bothersome.): -diarrhea -headache -nausea, vomiting -tiredness This list may not describe all possible side effects. Call your doctor for medical advice about side effects. You may report side effects to FDA at 1-800-FDA-1088. Where should I keep my medicine? This drug is given in a hospital or clinic and will not be stored at home. NOTE: This sheet is a summary. It may not cover all possible information. If you have questions about this medicine, talk to your doctor, pharmacist, or health care provider.  2014, Elsevier/Gold Standard. (2011-06-22 17:02:29)

## 2012-12-30 ENCOUNTER — Encounter: Payer: Self-pay | Admitting: *Deleted

## 2012-12-30 ENCOUNTER — Other Ambulatory Visit: Payer: Self-pay | Admitting: Internal Medicine

## 2012-12-30 ENCOUNTER — Other Ambulatory Visit: Payer: Medicare Other

## 2012-12-30 ENCOUNTER — Ambulatory Visit (HOSPITAL_BASED_OUTPATIENT_CLINIC_OR_DEPARTMENT_OTHER): Payer: Medicare Other

## 2012-12-30 VITALS — BP 119/59 | HR 54 | Temp 97.7°F

## 2012-12-30 DIAGNOSIS — C9002 Multiple myeloma in relapse: Secondary | ICD-10-CM | POA: Diagnosis not present

## 2012-12-30 DIAGNOSIS — Z5112 Encounter for antineoplastic immunotherapy: Secondary | ICD-10-CM

## 2012-12-30 MED ORDER — DEXTROSE 5 % IV SOLN
20.0000 mg/m2 | Freq: Once | INTRAVENOUS | Status: AC
  Administered 2012-12-30: 38 mg via INTRAVENOUS
  Filled 2012-12-30: qty 19

## 2012-12-30 MED ORDER — DEXAMETHASONE SODIUM PHOSPHATE 10 MG/ML IJ SOLN
10.0000 mg | Freq: Once | INTRAMUSCULAR | Status: AC
  Administered 2012-12-30: 10 mg via INTRAVENOUS

## 2012-12-30 MED ORDER — SODIUM CHLORIDE 0.9 % IV SOLN
Freq: Once | INTRAVENOUS | Status: AC
  Administered 2012-12-30: 10:00:00 via INTRAVENOUS

## 2012-12-30 MED ORDER — ONDANSETRON 8 MG/NS 50 ML IVPB
INTRAVENOUS | Status: AC
  Filled 2012-12-30: qty 8

## 2012-12-30 MED ORDER — ONDANSETRON 8 MG/50ML IVPB (CHCC)
8.0000 mg | Freq: Once | INTRAVENOUS | Status: AC
  Administered 2012-12-30: 8 mg via INTRAVENOUS

## 2012-12-30 MED ORDER — DEXAMETHASONE SODIUM PHOSPHATE 10 MG/ML IJ SOLN
INTRAMUSCULAR | Status: AC
  Filled 2012-12-30: qty 1

## 2012-12-30 MED ORDER — SODIUM CHLORIDE 0.9 % IV SOLN: Freq: Once | INTRAVENOUS | Status: DC

## 2012-12-30 NOTE — Patient Instructions (Addendum)
[facility] Discharge Instructions for Patients Receiving Chemotherapy  Today you received the following chemotherapy agent: carfilzomib/kyprolis  To help prevent nausea and vomiting after your treatment, we encourage you to take your nausea medication:  Compazine 10 mg every 6 hours as needed If you develop nausea and vomiting that is not controlled by your nausea medication, call the clinic.   BELOW ARE SYMPTOMS THAT SHOULD BE REPORTED IMMEDIATELY:  *FEVER GREATER THAN 100.5 F  *CHILLS WITH OR WITHOUT FEVER  NAUSEA AND VOMITING THAT IS NOT CONTROLLED WITH YOUR NAUSEA MEDICATION  *UNUSUAL SHORTNESS OF BREATH  *UNUSUAL BRUISING OR BLEEDING  TENDERNESS IN MOUTH AND THROAT WITH OR WITHOUT PRESENCE OF ULCERS  *URINARY PROBLEMS  *BOWEL PROBLEMS  UNUSUAL RASH Items with * indicate a potential emergency and should be followed up as soon as possible.  Feel free to call the clinic should you have any questions or concerns. The clinic phone number is [phone].  It has been a pleasure to serve you today!

## 2012-12-30 NOTE — Progress Notes (Signed)
Chaplain made initial visit. Pt said she was doing all right but was a little bored. Pt stated that she had been admitted to the hospital last month and a chaplain had brought her a Bible. She was admitted because her kidneys weren't functioning properly, which she later found out was because her cancer had come back. Pt stated that she had multiple myeloma years ago but that it had been in remission. She was diagnosed with breast cancer several years later. She stated that she had very good support from her family and home church. Chaplain asked if she knew about the multiple myeloma support group and patient said no, but that she would be interested. Chaplain brought pt a calendar with information about the support groups and other events for pts. Pt expressed thanks. Chaplain will follow up as necessary.

## 2012-12-31 ENCOUNTER — Telehealth: Payer: Self-pay | Admitting: *Deleted

## 2012-12-31 ENCOUNTER — Other Ambulatory Visit: Payer: Self-pay | Admitting: Radiology

## 2012-12-31 ENCOUNTER — Encounter (HOSPITAL_COMMUNITY): Payer: Self-pay | Admitting: Pharmacy Technician

## 2012-12-31 ENCOUNTER — Encounter: Payer: Self-pay | Admitting: Internal Medicine

## 2012-12-31 DIAGNOSIS — C9001 Multiple myeloma in remission: Secondary | ICD-10-CM

## 2012-12-31 MED ORDER — MAGIC MOUTHWASH W/LIDOCAINE: 5.0000 mL | Freq: Four times a day (QID) | ORAL | Status: DC | PRN

## 2012-12-31 MED ORDER — LIDOCAINE-PRILOCAINE 2.5-2.5 % EX CREA: 1.0000 "application " | TOPICAL_CREAM | CUTANEOUS | Status: DC | PRN

## 2012-12-31 NOTE — Telephone Encounter (Signed)
Pt is requesting a port, per Dr Vista Mink okay to have port placed.    Pt is also c/o mouth tenderness.  Per Dr Vista Mink, okay to call in magic mouthwash.  SLJ

## 2012-12-31 NOTE — Telephone Encounter (Signed)
Will be taking MiraLax today for bowel movement. No nausea-no change in appetite. IV site OK. Reports she will be getting PAC soon. Made her aware that [facility] will be called in by Dr. Worthy Flank nurse today for her mouth tenderness-continue baking soda/water rinses. She is aware to call for any questions or concerns.

## 2012-12-31 NOTE — Progress Notes (Signed)
Called and per [employer] termed 05/15/12. Medicare only.

## 2013-01-01 ENCOUNTER — Encounter (HOSPITAL_COMMUNITY): Payer: Self-pay

## 2013-01-01 ENCOUNTER — Other Ambulatory Visit: Payer: Medicare Other

## 2013-01-01 ENCOUNTER — Other Ambulatory Visit: Payer: Self-pay | Admitting: Internal Medicine

## 2013-01-01 ENCOUNTER — Ambulatory Visit (HOSPITAL_COMMUNITY)
Admission: RE | Admit: 2013-01-01 | Discharge: 2013-01-01 | Disposition: A | Discharge status: Home / Self Care | Payer: Medicare Other | Source: Ambulatory Visit | Attending: Internal Medicine | Admitting: Internal Medicine

## 2013-01-01 DIAGNOSIS — J4489 Other specified chronic obstructive pulmonary disease: Secondary | ICD-10-CM | POA: Insufficient documentation

## 2013-01-01 DIAGNOSIS — C9000 Multiple myeloma not having achieved remission: Secondary | ICD-10-CM | POA: Diagnosis not present

## 2013-01-01 DIAGNOSIS — C9001 Multiple myeloma in remission: Secondary | ICD-10-CM

## 2013-01-01 DIAGNOSIS — J449 Chronic obstructive pulmonary disease, unspecified: Secondary | ICD-10-CM | POA: Insufficient documentation

## 2013-01-01 DIAGNOSIS — E039 Hypothyroidism, unspecified: Secondary | ICD-10-CM | POA: Diagnosis not present

## 2013-01-01 DIAGNOSIS — Z9484 Stem cells transplant status: Secondary | ICD-10-CM | POA: Diagnosis not present

## 2013-01-01 DIAGNOSIS — IMO0001 Reserved for inherently not codable concepts without codable children: Secondary | ICD-10-CM | POA: Insufficient documentation

## 2013-01-01 DIAGNOSIS — Z79899 Other long term (current) drug therapy: Secondary | ICD-10-CM | POA: Insufficient documentation

## 2013-01-01 DIAGNOSIS — Z9010 Acquired absence of unspecified breast and nipple: Secondary | ICD-10-CM | POA: Diagnosis not present

## 2013-01-01 DIAGNOSIS — Z87891 Personal history of nicotine dependence: Secondary | ICD-10-CM | POA: Diagnosis not present

## 2013-01-01 LAB — CBC
MCH: 31.3 pg (ref 26.0–34.0)
MCHC: 33 g/dL (ref 30.0–36.0)
RBC: 3.2 MIL/uL — ABNORMAL LOW (ref 3.87–5.11)
RDW: 14.3 % (ref 11.5–15.5)

## 2013-01-01 MED ORDER — MIDAZOLAM HCL 2 MG/2ML IJ SOLN
INTRAMUSCULAR | Status: AC
  Filled 2013-01-01: qty 6

## 2013-01-01 MED ORDER — MIDAZOLAM HCL 2 MG/2ML IJ SOLN
INTRAMUSCULAR | Status: AC | PRN
  Administered 2013-01-01 (×3): 1 mg via INTRAVENOUS

## 2013-01-01 MED ORDER — FENTANYL CITRATE 0.05 MG/ML IJ SOLN
INTRAMUSCULAR | Status: AC | PRN
  Administered 2013-01-01: 100 ug via INTRAVENOUS

## 2013-01-01 MED ORDER — LIDOCAINE-EPINEPHRINE (PF) 2 %-1:200000 IJ SOLN
INTRAMUSCULAR | Status: AC
  Filled 2013-01-01: qty 20

## 2013-01-01 MED ORDER — HEPARIN SOD (PORK) LOCK FLUSH 100 UNIT/ML IV SOLN
INTRAVENOUS | Status: AC
  Filled 2013-01-01: qty 5

## 2013-01-01 MED ORDER — SODIUM CHLORIDE 0.9 % IV SOLN
INTRAVENOUS | Status: DC
  Administered 2013-01-01: 500 mL via INTRAVENOUS

## 2013-01-01 MED ORDER — CEFAZOLIN SODIUM-DEXTROSE 2-3 GM-% IV SOLR
2.0000 g | INTRAVENOUS | Status: AC
  Administered 2013-01-01: 2 g via INTRAVENOUS
  Filled 2013-01-01: qty 50

## 2013-01-01 MED ORDER — FENTANYL CITRATE 0.05 MG/ML IJ SOLN
INTRAMUSCULAR | Status: AC
  Filled 2013-01-01: qty 6

## 2013-01-01 MED ORDER — HEPARIN SOD (PORK) LOCK FLUSH 100 UNIT/ML IV SOLN
INTRAVENOUS | Status: AC | PRN
  Administered 2013-01-01: 500 [IU]

## 2013-01-01 NOTE — H&P (Signed)
Nancy Norris is an 72 y.o. female.   Chief Complaint: "I'm having a port put in" HPI: Patient with remote history of left breast DCIS and now with recurrent multiple myeloma presents today for port a cath placement for chemotherapy (pt has had rt upper chest port in past).  Past Medical History  Diagnosis Date  . Hyperkalemia   . Hypothyroidism   . COPD (chronic obstructive pulmonary disease)   . Hyponatremia   . Dizziness   . Fibromyalgia   . Breast cancer   . Multiple myeloma   . Mucositis     Past Surgical History  Procedure Laterality Date  . History of port removal    . Status post stem cell transplant on September 28, 2008.    [facility] Kitchen Abdominal hysterectomy  1981  . Cholecystectomy  1971  . Mastectomy Left 2008  . Cataract extraction, bilateral      Family History  Problem Relation Age of Onset  . Arthritis Mother   . Asthma Mother    Social History:  reports that she quit smoking about 6 years ago. Her smoking use included Cigarettes. She has a 30 pack-year smoking history. She has never used smokeless tobacco. She reports that she does not drink alcohol or use illicit drugs.  Allergies:  Allergies  Allergen Reactions  . Codeine Anaphylaxis  . Latex Shortness Of Breath    Adhesive products   . Iodinated Diagnostic Agents Itching    Happened 40 years ago  . Other     Onion, chocolate causes headache  . Sulfa Antibiotics Itching  . Adhesive [Tape]     blisters    Current outpatient prescriptions:acetaminophen (TYLENOL) 325 MG tablet, Take 650 mg by mouth every 6 (six) hours as needed for mild pain or moderate pain. , Disp: , Rfl: ;  ascorbic acid (VITAMIN C) 250 MG CHEW, Chew 250 mg by mouth daily., Disp: , Rfl: ;  cetirizine (ZYRTEC) 10 MG tablet, Take 10 mg by mouth at bedtime. , Disp: , Rfl: ;  Cholecalciferol (VITAMIN D3) 10000 UNITS capsule, Take 10,000 Units by mouth every other day., Disp: , Rfl:  citalopram (CELEXA) 40 MG tablet, Take 40 mg by mouth daily  with breakfast. , Disp: , Rfl: ;  Cranberry 300 MG tablet, Take 300 mg by mouth daily., Disp: , Rfl: ;  cromolyn (OPTICROM) 4 % ophthalmic solution, Place 1 drop into both eyes 4 (four) times daily., Disp: , Rfl: ;  cyanocobalamin 2000 MCG tablet, Take 2,000 mcg by mouth daily. , Disp: , Rfl: ;  Ferrous Sulfate 134 MG TABS, Take 1 tablet by mouth every other day. , Disp: , Rfl:  gabapentin (NEURONTIN) 300 MG capsule, Take 300 mg by mouth at bedtime., Disp: , Rfl: ;  levothyroxine (SYNTHROID, LEVOTHROID) 150 MCG tablet, Take 150-225 mcg by mouth daily. Takes 1 tablet daily except Saturday and Sunday she takes 1 and 1/2, Disp: , Rfl: ;  Multiple Vitamins-Minerals (HAIR/SKIN/NAILS PO), Take 1 tablet by mouth daily., Disp: , Rfl: ;  pantoprazole (PROTONIX) 40 MG tablet, Take 40 mg by mouth 2 (two) times daily. , Disp: , Rfl:  potassium chloride SA (K-DUR,KLOR-CON) 20 MEQ tablet, Take 20 mEq by mouth daily., Disp: , Rfl: ;  albuterol (PROVENTIL HFA;VENTOLIN HFA) 108 (90 BASE) MCG/ACT inhaler, Inhale 2 puffs into the lungs 2 (two) times daily as needed for wheezing or shortness of breath., Disp: , Rfl:  Alum & Mag Hydroxide-Simeth (MAGIC MOUTHWASH W/LIDOCAINE) SOLN, Take 5 mLs by mouth  4 (four) times daily as needed for mouth pain (swish and spit QID PRN)., Disp: 240 mL, Rfl: 0;  dexamethasone (DECADRON) 4 MG tablet, 10 tablets by mouth on a weekly basis starting with the first dose of chemotherapy., Disp: 40 tablet, Rfl: 4 lidocaine-prilocaine (EMLA) cream, Apply 1 application topically as needed. Apply to port 1 hour before chemotherapy appt., Disp: 30 g, Rfl: 0;  loteprednol (LOTEMAX) 0.2 % SUSP, Place 1 drop into both eyes 3 (three) times daily. , Disp: , Rfl: ;  pregabalin (LYRICA) 50 MG capsule, Take 50 mg by mouth at bedtime. , Disp: , Rfl:  prochlorperazine (COMPAZINE) 10 MG tablet, Take 1 tablet (10 mg total) by mouth every 6 (six) hours as needed for nausea or vomiting., Disp: 60 tablet, Rfl: 0 Current  facility-administered medications:0.9 %  sodium chloride infusion, , Intravenous, Continuous, D Kevin Allred, PA-C, Last Rate: 20 mL/hr at 01/01/13 1058, 500 mL at 01/01/13 1058;  ceFAZolin (ANCEF) IVPB 2 g/50 mL premix, 2 g, Intravenous, On Call, D Rowe Robert, PA-C   Results for orders placed during the hospital encounter of 01/01/13 (from the past 48 hour(s))  CBC     Status: Abnormal   Collection Time    01/01/13 11:00 AM      Result Value Range   WBC 5.6  4.0 - 10.5 K/uL   RBC 3.20 (*) 3.87 - 5.11 MIL/uL   Hemoglobin 10.0 (*) 12.0 - 15.0 g/dL   HCT 30.3 (*) 36.0 - 46.0 %   MCV 94.7  78.0 - 100.0 fL   MCH 31.3  26.0 - 34.0 pg   MCHC 33.0  30.0 - 36.0 g/dL   RDW 14.3  11.5 - 15.5 %   Platelets 217  150 - 400 K/uL   No results found.  Review of Systems  Constitutional: Negative for fever and chills.  HENT:       Occ HA's  Respiratory: Negative for cough.   Cardiovascular: Negative for chest pain.  Gastrointestinal: Negative for nausea, vomiting and abdominal pain.  Musculoskeletal: Negative for back pain.  Neurological:       Intermittent LE peripheral neuropathy    Blood pressure 119/70, pulse 64, temperature 97.8 F (36.6 C), resp. rate 18, height 5\' 5"  (1.651 m), weight 179 lb (81.194 kg), SpO2 99.00%. Physical Exam  Constitutional: She is oriented to person, place, and time. She appears well-developed and well-nourished.  Cardiovascular: Normal rate and regular rhythm.   Respiratory: Effort normal and breath sounds normal.  GI: Bowel sounds are normal.  Musculoskeletal: Normal range of motion. She exhibits no edema.  Neurological: She is alert and oriented to person, place, and time.     Assessment/Plan Pt with remote history of left breast DCIS, now with recurrent multiple myeloma. Plan is for port a cath placement today for chemotherapy. Details/risks of procedure d/w pt with her understanding and consent.  ALLRED,D KEVIN 01/01/2013, 12:44 PM

## 2013-01-01 NOTE — Procedures (Signed)
Successful placement of right IJ approach port-a-cath with tip at the superior caval atrial junction. The catheter is ready for immediate use. No immediate post procedural complications. 

## 2013-01-05 ENCOUNTER — Other Ambulatory Visit (HOSPITAL_BASED_OUTPATIENT_CLINIC_OR_DEPARTMENT_OTHER): Payer: Medicare Other

## 2013-01-05 ENCOUNTER — Ambulatory Visit (HOSPITAL_BASED_OUTPATIENT_CLINIC_OR_DEPARTMENT_OTHER): Payer: Medicare Other

## 2013-01-05 VITALS — BP 126/69 | HR 66 | Temp 98.1°F | Resp 16

## 2013-01-05 DIAGNOSIS — Z5111 Encounter for antineoplastic chemotherapy: Secondary | ICD-10-CM | POA: Diagnosis not present

## 2013-01-05 DIAGNOSIS — C9002 Multiple myeloma in relapse: Secondary | ICD-10-CM

## 2013-01-05 DIAGNOSIS — Z5112 Encounter for antineoplastic immunotherapy: Secondary | ICD-10-CM | POA: Diagnosis not present

## 2013-01-05 LAB — COMPREHENSIVE METABOLIC PANEL (CC13)
ALT: 55 U/L (ref 0–55)
Albumin: 3.2 g/dL — ABNORMAL LOW (ref 3.5–5.0)
Alkaline Phosphatase: 205 U/L — ABNORMAL HIGH (ref 40–150)
CO2: 17 mEq/L — ABNORMAL LOW (ref 22–29)
Calcium: 8.6 mg/dL (ref 8.4–10.4)
Chloride: 116 mEq/L — ABNORMAL HIGH (ref 98–109)
Glucose: 102 mg/dl (ref 70–140)
Potassium: 4 mEq/L (ref 3.5–5.1)
Sodium: 141 mEq/L (ref 136–145)
Total Bilirubin: 0.89 mg/dL (ref 0.20–1.20)
Total Protein: 6.6 g/dL (ref 6.4–8.3)

## 2013-01-05 LAB — CBC WITH DIFFERENTIAL/PLATELET
BASO%: 0.2 % (ref 0.0–2.0)
Basophils Absolute: 0 10*3/uL (ref 0.0–0.1)
Eosinophils Absolute: 0.4 10*3/uL (ref 0.0–0.5)
HCT: 28.7 % — ABNORMAL LOW (ref 34.8–46.6)
MCH: 30.6 pg (ref 25.1–34.0)
MCHC: 32.8 g/dL (ref 31.5–36.0)
MONO#: 0.8 10*3/uL (ref 0.1–0.9)
MONO%: 8.5 % (ref 0.0–14.0)
NEUT#: 6.6 10*3/uL — ABNORMAL HIGH (ref 1.5–6.5)
Platelets: 206 10*3/uL (ref 145–400)
RBC: 3.07 10*6/uL — ABNORMAL LOW (ref 3.70–5.45)
RDW: 14.1 % (ref 11.2–14.5)
WBC: 9.9 10*3/uL (ref 3.9–10.3)

## 2013-01-05 MED ORDER — SODIUM CHLORIDE 0.9 % IV SOLN
Freq: Once | INTRAVENOUS | Status: AC
  Administered 2013-01-05: 16:00:00 via INTRAVENOUS

## 2013-01-05 MED ORDER — DEXTROSE 5 % IV SOLN
36.0000 mg/m2 | Freq: Once | INTRAVENOUS | Status: AC
  Administered 2013-01-05: 70 mg via INTRAVENOUS
  Filled 2013-01-05: qty 35

## 2013-01-05 MED ORDER — DEXAMETHASONE SODIUM PHOSPHATE 10 MG/ML IJ SOLN
INTRAMUSCULAR | Status: AC
  Filled 2013-01-05: qty 1

## 2013-01-05 MED ORDER — HEPARIN SOD (PORK) LOCK FLUSH 100 UNIT/ML IV SOLN
500.0000 [IU] | Freq: Once | INTRAVENOUS | Status: AC | PRN
  Administered 2013-01-05: 500 [IU]
  Filled 2013-01-05: qty 5

## 2013-01-05 MED ORDER — ONDANSETRON 8 MG/NS 50 ML IVPB
INTRAVENOUS | Status: AC
  Filled 2013-01-05: qty 8

## 2013-01-05 MED ORDER — SODIUM CHLORIDE 0.9 % IV SOLN
300.0000 mg/m2 | Freq: Once | INTRAVENOUS | Status: AC
  Administered 2013-01-05: 580 mg via INTRAVENOUS
  Filled 2013-01-05: qty 29

## 2013-01-05 MED ORDER — SODIUM CHLORIDE 0.9 % IJ SOLN
10.0000 mL | INTRAMUSCULAR | Status: DC | PRN
  Administered 2013-01-05: 10 mL
  Filled 2013-01-05: qty 10

## 2013-01-05 MED ORDER — DEXAMETHASONE SODIUM PHOSPHATE 10 MG/ML IJ SOLN
10.0000 mg | Freq: Once | INTRAMUSCULAR | Status: AC
  Administered 2013-01-05: 10 mg via INTRAVENOUS

## 2013-01-05 MED ORDER — ONDANSETRON 8 MG/50ML IVPB (CHCC)
8.0000 mg | Freq: Once | INTRAVENOUS | Status: AC
  Administered 2013-01-05: 8 mg via INTRAVENOUS

## 2013-01-05 NOTE — Progress Notes (Unsigned)
Patient c/o nausea and requested to try aromatherapy.  Gave her gauze with ginger-orange essential oil.  Patient experienced immediate relief.

## 2013-01-05 NOTE — Patient Instructions (Signed)
[facility] Discharge Instructions for Patients Receiving Chemotherapy  Today you received the following chemotherapy agents kyprolis, cytoxan  To help prevent nausea and vomiting after your treatment, we encourage you to take your nausea medication about bedtime if you need it.   If you develop nausea and vomiting that is not controlled by your nausea medication, call the clinic.   BELOW ARE SYMPTOMS THAT SHOULD BE REPORTED IMMEDIATELY:  *FEVER GREATER THAN 100.5 F  *CHILLS WITH OR WITHOUT FEVER  NAUSEA AND VOMITING THAT IS NOT CONTROLLED WITH YOUR NAUSEA MEDICATION  *UNUSUAL SHORTNESS OF BREATH  *UNUSUAL BRUISING OR BLEEDING  TENDERNESS IN MOUTH AND THROAT WITH OR WITHOUT PRESENCE OF ULCERS  *URINARY PROBLEMS  *BOWEL PROBLEMS  UNUSUAL RASH Items with * indicate a potential emergency and should be followed up as soon as possible.  Feel free to call the clinic you have any questions or concerns. The clinic phone number is [phone].

## 2013-01-05 NOTE — Progress Notes (Unsigned)
Nancy Norris   DOB: 07-22-1940   MR#: [id number]   [id number]   Medical History:  Past Medical History  Diagnosis Date  . Hyperkalemia   . Hypothyroidism   . COPD (chronic obstructive pulmonary disease)   . Hyponatremia   . Dizziness   . Fibromyalgia   . Breast cancer   . Multiple myeloma   . Mucositis     chemotherapy   Surgical history:  Past Surgical History  Procedure Laterality Date  . History of port removal    . Status post stem cell transplant on September 28, 2008.    Marland Kitchen Abdominal hysterectomy  1981  . Cholecystectomy  1971  . Mastectomy Left 2008  . Cataract extraction, bilateral      Allergies: is allergic to codeine; latex; iodinated diagnostic agents; other; sulfa antibiotics; and adhesive.  Are the medications listed below an accurate list? {YES/NO/WILD CARDS:18581} Meds:  (Not in a hospital admission)    Patient last received chemotherapy/ treatment on {NIC DD:3846704  Patient was last seen in the office on{NIC WILDCARD:3044014}{NIC DD:3846704  Is patient having fevers greater than 100.5? {YES/NO/WILD RC:4691767  Is patient having uncontrolled pain, or new pain? {YES/NO/WILD RC:4691767  Is patient having new back pain that changes with position (worsens or eases when laying down?) {Responses; yes/no/wildcard:311178} Is patient able to eat and drink? {YES/NO/WILD RC:4691767   Is patient able to pass stool without difficulty?  {YES/NO/WILD RC:4691767   Is patient having uncontrolled nausea? {YES/NO/WILD CARDS:18581} Any change in mental status?  {YES/NO/WILD CARDS:18581} Any new difficulty in moving legs or arms?  {Responses; yes/no/wildcard:311178}  {Patient/Family/Caregiver:109081} calls @DATE @ with complaint of   {NIC DD:3846704 {Ros - for gen surg:31966}     Summary Based on the above information advised {Patient/Family/Caregiver:109081} to   Advised patient to {NIC DD:3846704   Sander Nephew

## 2013-01-06 ENCOUNTER — Ambulatory Visit (HOSPITAL_BASED_OUTPATIENT_CLINIC_OR_DEPARTMENT_OTHER): Payer: Medicare Other

## 2013-01-06 ENCOUNTER — Other Ambulatory Visit: Payer: Self-pay | Admitting: Medical Oncology

## 2013-01-06 VITALS — BP 134/64 | HR 53 | Temp 97.4°F | Resp 18

## 2013-01-06 DIAGNOSIS — R3915 Urgency of urination: Secondary | ICD-10-CM

## 2013-01-06 DIAGNOSIS — C9002 Multiple myeloma in relapse: Secondary | ICD-10-CM

## 2013-01-06 DIAGNOSIS — Z5112 Encounter for antineoplastic immunotherapy: Secondary | ICD-10-CM | POA: Diagnosis not present

## 2013-01-06 DIAGNOSIS — R339 Retention of urine, unspecified: Secondary | ICD-10-CM

## 2013-01-06 DIAGNOSIS — N19 Unspecified kidney failure: Secondary | ICD-10-CM | POA: Diagnosis not present

## 2013-01-06 LAB — URINALYSIS, MICROSCOPIC - CHCC
Bilirubin (Urine): NEGATIVE
Glucose: 250 mg/dL
Nitrite: NEGATIVE
Protein: <30 mg/dL
RBC / HPF: NEGATIVE (ref 0–2)
Specific Gravity, Urine: 1.005 (ref 1.003–1.035)
Urobilinogen, UR: 0.2 mg/dL (ref 0.2–1)

## 2013-01-06 MED ORDER — DEXTROSE 5 % IV SOLN
36.0000 mg/m2 | Freq: Once | INTRAVENOUS | Status: AC
  Administered 2013-01-06: 70 mg via INTRAVENOUS
  Filled 2013-01-06: qty 35

## 2013-01-06 MED ORDER — DEXAMETHASONE SODIUM PHOSPHATE 10 MG/ML IJ SOLN
INTRAMUSCULAR | Status: AC
  Filled 2013-01-06: qty 1

## 2013-01-06 MED ORDER — DEXAMETHASONE SODIUM PHOSPHATE 10 MG/ML IJ SOLN
10.0000 mg | Freq: Once | INTRAMUSCULAR | Status: AC
  Administered 2013-01-06: 10 mg via INTRAVENOUS

## 2013-01-06 MED ORDER — SODIUM CHLORIDE 0.9 % IV SOLN
Freq: Once | INTRAVENOUS | Status: AC
  Administered 2013-01-06: 14:00:00 via INTRAVENOUS

## 2013-01-06 MED ORDER — ONDANSETRON 8 MG/NS 50 ML IVPB
INTRAVENOUS | Status: AC
  Filled 2013-01-06: qty 8

## 2013-01-06 MED ORDER — HEPARIN SOD (PORK) LOCK FLUSH 100 UNIT/ML IV SOLN
500.0000 [IU] | Freq: Once | INTRAVENOUS | Status: AC | PRN
  Administered 2013-01-06: 500 [IU]
  Filled 2013-01-06: qty 5

## 2013-01-06 MED ORDER — ONDANSETRON 8 MG/50ML IVPB (CHCC)
8.0000 mg | Freq: Once | INTRAVENOUS | Status: AC
  Administered 2013-01-06: 8 mg via INTRAVENOUS

## 2013-01-06 MED ORDER — SODIUM CHLORIDE 0.9 % IJ SOLN
10.0000 mL | INTRAMUSCULAR | Status: DC | PRN
  Administered 2013-01-06: 10 mL
  Filled 2013-01-06: qty 10

## 2013-01-06 NOTE — Patient Instructions (Signed)
[facility] Discharge Instructions for Patients Receiving Chemotherapy  Today you received the following chemotherapy agents Kyprolis To help prevent nausea and vomiting after your treatment, we encourage you to take your nausea medication as prescribed.  If you develop nausea and vomiting that is not controlled by your nausea medication, call the clinic.   BELOW ARE SYMPTOMS THAT SHOULD BE REPORTED IMMEDIATELY:  *FEVER GREATER THAN 100.5 F  *CHILLS WITH OR WITHOUT FEVER  NAUSEA AND VOMITING THAT IS NOT CONTROLLED WITH YOUR NAUSEA MEDICATION  *UNUSUAL SHORTNESS OF BREATH  *UNUSUAL BRUISING OR BLEEDING  TENDERNESS IN MOUTH AND THROAT WITH OR WITHOUT PRESENCE OF ULCERS  *URINARY PROBLEMS  *BOWEL PROBLEMS  UNUSUAL RASH Items with * indicate a potential emergency and should be followed up as soon as possible.  Feel free to call the clinic you have any questions or concerns. The clinic phone number is [phone].    

## 2013-01-07 LAB — URINE CULTURE

## 2013-01-08 ENCOUNTER — Other Ambulatory Visit: Payer: Medicare Other

## 2013-01-09 LAB — UPEP/TP, 24-HR URINE
Alpha-1-Globulin, U: 24.9 %
Alpha-2-Globulin, U: 24.6 %
Beta Globulin, U: 26.9 %
Gamma Globulin, U: 11.6 %
Total Protein, Urine: 50 mg/dL
Total Volume, Urine: 2200 mL

## 2013-01-12 ENCOUNTER — Telehealth: Payer: Self-pay | Admitting: Internal Medicine

## 2013-01-12 ENCOUNTER — Other Ambulatory Visit (HOSPITAL_BASED_OUTPATIENT_CLINIC_OR_DEPARTMENT_OTHER): Payer: Medicare Other

## 2013-01-12 ENCOUNTER — Encounter: Payer: Self-pay | Admitting: Physician Assistant

## 2013-01-12 ENCOUNTER — Encounter (HOSPITAL_COMMUNITY): Payer: Self-pay

## 2013-01-12 ENCOUNTER — Ambulatory Visit (HOSPITAL_BASED_OUTPATIENT_CLINIC_OR_DEPARTMENT_OTHER): Payer: Medicare Other

## 2013-01-12 ENCOUNTER — Telehealth: Payer: Self-pay | Admitting: *Deleted

## 2013-01-12 ENCOUNTER — Ambulatory Visit (HOSPITAL_BASED_OUTPATIENT_CLINIC_OR_DEPARTMENT_OTHER): Payer: Medicare Other | Admitting: Physician Assistant

## 2013-01-12 VITALS — BP 132/75 | HR 79 | Temp 97.8°F | Resp 18 | Ht 65.0 in | Wt 186.6 lb

## 2013-01-12 DIAGNOSIS — Z5112 Encounter for antineoplastic immunotherapy: Secondary | ICD-10-CM

## 2013-01-12 DIAGNOSIS — R5381 Other malaise: Secondary | ICD-10-CM

## 2013-01-12 DIAGNOSIS — Z87898 Personal history of other specified conditions: Secondary | ICD-10-CM

## 2013-01-12 DIAGNOSIS — C9002 Multiple myeloma in relapse: Secondary | ICD-10-CM

## 2013-01-12 DIAGNOSIS — R609 Edema, unspecified: Secondary | ICD-10-CM

## 2013-01-12 DIAGNOSIS — Z5111 Encounter for antineoplastic chemotherapy: Secondary | ICD-10-CM | POA: Diagnosis not present

## 2013-01-12 DIAGNOSIS — N289 Disorder of kidney and ureter, unspecified: Secondary | ICD-10-CM

## 2013-01-12 DIAGNOSIS — Z853 Personal history of malignant neoplasm of breast: Secondary | ICD-10-CM

## 2013-01-12 DIAGNOSIS — R5383 Other fatigue: Secondary | ICD-10-CM

## 2013-01-12 DIAGNOSIS — R600 Localized edema: Secondary | ICD-10-CM

## 2013-01-12 DIAGNOSIS — C9001 Multiple myeloma in remission: Secondary | ICD-10-CM

## 2013-01-12 LAB — COMPREHENSIVE METABOLIC PANEL (CC13)
ALT: 31 U/L (ref 0–55)
AST: 20 U/L (ref 5–34)
Albumin: 3.4 g/dL — ABNORMAL LOW (ref 3.5–5.0)
CO2: 18 mEq/L — ABNORMAL LOW (ref 22–29)
Calcium: 8.9 mg/dL (ref 8.4–10.4)
Chloride: 114 mEq/L — ABNORMAL HIGH (ref 98–109)
Creatinine: 2.5 mg/dL — ABNORMAL HIGH (ref 0.6–1.1)
Glucose: 124 mg/dl (ref 70–140)
Potassium: 3.8 mEq/L (ref 3.5–5.1)
Total Protein: 6.7 g/dL (ref 6.4–8.3)

## 2013-01-12 LAB — CBC WITH DIFFERENTIAL/PLATELET
BASO%: 0.2 % (ref 0.0–2.0)
EOS%: 2.4 % (ref 0.0–7.0)
Eosinophils Absolute: 0.1 10*3/uL (ref 0.0–0.5)
MCH: 30.6 pg (ref 25.1–34.0)
MCHC: 32.7 g/dL (ref 31.5–36.0)
Platelets: 186 10*3/uL (ref 145–400)
RDW: 13.6 % (ref 11.2–14.5)
lymph#: 0.7 10*3/uL — ABNORMAL LOW (ref 0.9–3.3)

## 2013-01-12 MED ORDER — ONDANSETRON 8 MG/50ML IVPB (CHCC)
8.0000 mg | Freq: Once | INTRAVENOUS | Status: AC
  Administered 2013-01-12: 8 mg via INTRAVENOUS

## 2013-01-12 MED ORDER — DEXTROSE 5 % IV SOLN
36.0000 mg/m2 | Freq: Once | INTRAVENOUS | Status: AC
  Administered 2013-01-12: 70 mg via INTRAVENOUS
  Filled 2013-01-12: qty 35

## 2013-01-12 MED ORDER — ONDANSETRON 8 MG/NS 50 ML IVPB
INTRAVENOUS | Status: AC
  Filled 2013-01-12: qty 8

## 2013-01-12 MED ORDER — SODIUM CHLORIDE 0.9 % IV SOLN
300.0000 mg/m2 | Freq: Once | INTRAVENOUS | Status: AC
  Administered 2013-01-12: 580 mg via INTRAVENOUS
  Filled 2013-01-12: qty 29

## 2013-01-12 MED ORDER — SODIUM CHLORIDE 0.9 % IJ SOLN
10.0000 mL | INTRAMUSCULAR | Status: DC | PRN
  Administered 2013-01-12: 10 mL
  Filled 2013-01-12: qty 10

## 2013-01-12 MED ORDER — DEXAMETHASONE SODIUM PHOSPHATE 10 MG/ML IJ SOLN
INTRAMUSCULAR | Status: AC
  Filled 2013-01-12: qty 1

## 2013-01-12 MED ORDER — SODIUM CHLORIDE 0.9 % IV SOLN
Freq: Once | INTRAVENOUS | Status: AC
  Administered 2013-01-12: 11:00:00 via INTRAVENOUS

## 2013-01-12 MED ORDER — HEPARIN SOD (PORK) LOCK FLUSH 100 UNIT/ML IV SOLN
500.0000 [IU] | Freq: Once | INTRAVENOUS | Status: AC | PRN
  Administered 2013-01-12: 500 [IU]
  Filled 2013-01-12: qty 5

## 2013-01-12 MED ORDER — DEXAMETHASONE SODIUM PHOSPHATE 10 MG/ML IJ SOLN
10.0000 mg | Freq: Once | INTRAMUSCULAR | Status: AC
  Administered 2013-01-12: 10 mg via INTRAVENOUS

## 2013-01-12 MED ORDER — ZOLEDRONIC ACID 4 MG/5ML IV CONC
3.0000 mg | Freq: Once | INTRAVENOUS | Status: AC
  Administered 2013-01-12: 3 mg via INTRAVENOUS
  Filled 2013-01-12: qty 3.75

## 2013-01-12 MED ORDER — SODIUM CHLORIDE 0.9 % IV SOLN
Freq: Once | INTRAVENOUS | Status: AC
  Administered 2013-01-12: 10:00:00 via INTRAVENOUS

## 2013-01-12 NOTE — Patient Instructions (Signed)
[facility] Discharge Instructions for Patients Receiving Chemotherapy  Today you received the following chemotherapy agents: Zometa, Cytoxan, Kyprolis   To help prevent nausea and vomiting after your treatment, we encourage you to take your nausea medication as prescribed.   If you develop nausea and vomiting that is not controlled by your nausea medication, call the clinic.   BELOW ARE SYMPTOMS THAT SHOULD BE REPORTED IMMEDIATELY:  *FEVER GREATER THAN 100.5 F  *CHILLS WITH OR WITHOUT FEVER  NAUSEA AND VOMITING THAT IS NOT CONTROLLED WITH YOUR NAUSEA MEDICATION  *UNUSUAL SHORTNESS OF BREATH  *UNUSUAL BRUISING OR BLEEDING  TENDERNESS IN MOUTH AND THROAT WITH OR WITHOUT PRESENCE OF ULCERS  *URINARY PROBLEMS  *BOWEL PROBLEMS  UNUSUAL RASH Items with * indicate a potential emergency and should be followed up as soon as possible.  Feel free to call the clinic you have any questions or concerns. The clinic phone number is [phone].

## 2013-01-12 NOTE — Telephone Encounter (Signed)
Per staff message and POF I have scheduled appts.  JMW  

## 2013-01-12 NOTE — Telephone Encounter (Signed)
Gave pt appt for lab and MD for january 2015, emailed Sharyn Lull regarding chemo , pt woll have U/S tomorrow of lower extremity  pt aware of appts

## 2013-01-12 NOTE — Patient Instructions (Signed)
Continue lab and chemotherapy as scheduled Followup in 2 weeks prior to the start of her next scheduled cycle of chemotherapy

## 2013-01-12 NOTE — Progress Notes (Addendum)
[facility] [phone]   [phone]   SHARED VISIT PROGRESS NOTE  PRINCIPAL DIAGNOSES:  1. Recurrent multiple myeloma initially diagnosed in 2002, initially with smoldering myeloma at [facility].  2. Ductal carcinoma in situ status post mastectomy with sentinel lymph node biopsy in October 2008.  PRIOR THERAPY:  1. Status post treatment with tamoxifen from November 2008 through February 2009, discontinued secondary to intolerance. 2. Status post 3 cycles of chemotherapy with Revlimid and Decadron followed by 1 cycle of Decadron only with mild response. 3. Status post 2 cycles of systemic chemotherapy with Velcade, Doxil and Decadron discontinued secondary to significant peripheral neuropathy. Last dose was given May 2010 at [facility]. 4. Status post autologous peripheral blood stem cell transplant on October 01, 2008 at [facility] under the care of Dr. Phyllis Ginger.  CURRENT THERAPY: Systemic chemotherapy with Carfilzomib initially was 20 mg/M2 and will be increased after cycle #1 to 36 mg/M2 on days 1, 2, 8, 9, 15 and 16 every 4 weeks in addition to Cytoxan 300 mg/M2 and Decadron 40 mg by mouth weekly basis. First cycle started on 12/29/2012.   INTERVAL HISTORY: Nancy Norris 72 y.o. female returns to the clinic today for followup visit. Overall she's tolerating her chemotherapy without difficulty. She does report some difficulty swallowing particularly dry foods like bread. She states this is been a long-standing problem and is also associated with a slight decrease in her appetite. She complains of nausea that is reasonably well-controlled with her Compazine although it makes her sleepy. She states she heard herself wheezing last week but denied any specific upper respiratory symptomatology today. She complains of lower extremity edema with pain and soreness in the right lower extremity. Her edema has been present "for a  while" and she is wearing compression socks on both legs. She was concerned about her fluid intake, thinking that she may be slightly.dehydrated however she reports that she drinks about a gallon of water every one and half days in addition to other fluids throughout the day. She voiced no other specific complaints. She denied having any significant weight loss or night sweats. She has no chest pain, shortness of breath, cough or hemoptysis.   MEDICAL HISTORY: Past Medical History  Diagnosis Date  . Hyperkalemia   . Hypothyroidism   . COPD (chronic obstructive pulmonary disease)   . Hyponatremia   . Dizziness   . Fibromyalgia   . Breast cancer   . Multiple myeloma   . Mucositis     ALLERGIES:  is allergic to codeine; latex; iodinated diagnostic agents; other; sulfa antibiotics; and adhesive.  MEDICATIONS:  Current Outpatient Prescriptions  Medication Sig Dispense Refill  . acetaminophen (TYLENOL) 325 MG tablet Take 650 mg by mouth every 6 (six) hours as needed for mild pain or moderate pain.       Marland Kitchen albuterol (PROVENTIL HFA;VENTOLIN HFA) 108 (90 BASE) MCG/ACT inhaler Inhale 2 puffs into the lungs 2 (two) times daily as needed for wheezing or shortness of breath.      . Alum & Mag Hydroxide-Simeth (MAGIC MOUTHWASH W/LIDOCAINE) SOLN Take 5 mLs by mouth 4 (four) times daily as needed for mouth pain (swish and spit QID PRN).  240 mL  0  . ascorbic acid (VITAMIN C) 250 MG CHEW Chew 250 mg by mouth daily.      . cetirizine (ZYRTEC) 10 MG tablet Take 10 mg by mouth at bedtime.       Marland Kitchen  Cholecalciferol (VITAMIN D3) 10000 UNITS capsule Take 10,000 Units by mouth every other day.      . citalopram (CELEXA) 40 MG tablet Take 40 mg by mouth daily with breakfast.       . Cranberry 300 MG tablet Take 300 mg by mouth daily.      . cromolyn (OPTICROM) 4 % ophthalmic solution Place 1 drop into both eyes 4 (four) times daily.      . cyanocobalamin 2000 MCG tablet Take 2,000 mcg by mouth daily.       Marland Kitchen  dexamethasone (DECADRON) 4 MG tablet 10 tablets by mouth on a weekly basis starting with the first dose of chemotherapy.  40 tablet  4  . Ferrous Sulfate 134 MG TABS Take 1 tablet by mouth every other day.       . gabapentin (NEURONTIN) 300 MG capsule Take 300 mg by mouth at bedtime.      Marland Kitchen levothyroxine (SYNTHROID, LEVOTHROID) 150 MCG tablet Take 150-225 mcg by mouth daily. Takes 1 tablet daily except Saturday and Sunday she takes 1 and 1/2      . lidocaine-prilocaine (EMLA) cream Apply 1 application topically as needed. Apply to port 1 hour before chemotherapy appt.  30 g  0  . loteprednol (LOTEMAX) 0.2 % SUSP Place 1 drop into both eyes 3 (three) times daily.       . Multiple Vitamins-Minerals (HAIR/SKIN/NAILS PO) Take 1 tablet by mouth daily.      . pantoprazole (PROTONIX) 40 MG tablet Take 40 mg by mouth 2 (two) times daily.       . prochlorperazine (COMPAZINE) 10 MG tablet Take 1 tablet (10 mg total) by mouth every 6 (six) hours as needed for nausea or vomiting.  60 tablet  0  . potassium chloride SA (K-DUR,KLOR-CON) 20 MEQ tablet Take 20 mEq by mouth daily.      . pregabalin (LYRICA) 50 MG capsule Take 50 mg by mouth at bedtime.        No current facility-administered medications for this visit.   Facility-Administered Medications Ordered in Other Visits  Medication Dose Route Frequency Provider Last Rate Last Dose  . sodium chloride 0.9 % injection 10 mL  10 mL Intracatheter PRN Curt Bears, MD   10 mL at 01/05/13 1825  . sodium chloride 0.9 % injection 10 mL  10 mL Intracatheter PRN Curt Bears, MD   10 mL at 01/12/13 1243    SURGICAL HISTORY:  Past Surgical History  Procedure Laterality Date  . History of port removal    . Status post stem cell transplant on September 28, 2008.    Marland Kitchen Abdominal hysterectomy  1981  . Cholecystectomy  1971  . Mastectomy Left 2008  . Cataract extraction, bilateral      REVIEW OF SYSTEMS:  Constitutional: positive for fatigue Eyes:  negative Ears, nose, mouth, throat, and face: positive for difficulty swallowing Respiratory: negative Cardiovascular: negative Gastrointestinal: positive for nausea Genitourinary:negative Integument/breast: negative Hematologic/lymphatic: negative Musculoskeletal:negative Neurological: negative Behavioral/Psych: negative Endocrine: negative Allergic/Immunologic: negative   PHYSICAL EXAMINATION: General appearance: alert, cooperative and no distress Head: Normocephalic, without obvious abnormality, atraumatic Neck: no adenopathy Lymph nodes: Cervical, supraclavicular, and axillary nodes normal. Resp: clear to auscultation bilaterally Back: symmetric, no curvature. ROM normal. No CVA tenderness. Cardio: regular rate and rhythm, S1, S2 normal, no murmur, click, rub or gallop GI: soft, non-tender; bowel sounds normal; no masses,  no organomegaly Extremities: edema 2+ pitting edema, bilateral lower extremities R>L, with tenderness in the right  calf Neurologic: Alert and oriented X 3, normal strength and tone. Normal symmetric reflexes. Normal coordination and gait  ECOG PERFORMANCE STATUS: 1 - Symptomatic but completely ambulatory  Blood pressure 132/75, pulse 79, temperature 97.8 F (36.6 C), temperature source Oral, resp. rate 18, height 5\' 5"  (1.651 m), weight 186 lb 9.6 oz (84.641 kg).  LABORATORY DATA: Lab Results  Component Value Date   WBC 5.4 01/12/2013   HGB 8.7* 01/12/2013   HCT 26.6* 01/12/2013   MCV 93.7 01/12/2013   PLT 186 01/12/2013      Chemistry      Component Value Date/Time   NA 141 01/12/2013 0847   NA 139 12/08/2012 1806   K 3.8 01/12/2013 0847   K 3.4* 12/08/2012 1806   CL 107 12/08/2012 1806   CL 105 03/19/2012 0811   CO2 18* 01/12/2013 0847   CO2 20 12/08/2012 1806   BUN 27.7* 01/12/2013 0847   BUN 17 12/08/2012 1806   CREATININE 2.5* 01/12/2013 0847   CREATININE 3.15* 12/08/2012 1806   CREATININE 3.48* 12/06/2012 0424      Component Value  Date/Time   CALCIUM 8.9 01/12/2013 0847   CALCIUM 12.5* 12/08/2012 1806   ALKPHOS 147 01/12/2013 0847   ALKPHOS 149* 12/01/2012 1609   AST 20 01/12/2013 0847   AST 27 12/01/2012 1609   ALT 31 01/12/2013 0847   ALT 24 12/01/2012 1609   BILITOT 0.63 01/12/2013 0847   BILITOT 0.7 12/01/2012 1609       RADIOGRAPHIC STUDIES: Dg Chest 2 View  12/02/2012   CLINICAL DATA:  Abdominal pain, former smoker, history COPD, breast cancer, multiple myeloma  EXAM: CHEST  2 VIEW  COMPARISON:  08/22/2009  FINDINGS: Normal heart size and pulmonary vascularity.  Tortuous thoracic aorta with atherosclerotic calcification.  Emphysematous and bronchitic changes consistent with COPD.  Minimal atelectasis or scarring at lung bases.  No definite acute infiltrate, pleural effusion or pneumothorax.  Prior left breast surgery.  Osseous demineralization.  IMPRESSION: COPD changes with minimal atelectasis or scarring at lung bases.  Remaining lungs clear.   Electronically Signed   By: Lavonia Dana M.D.   On: 12/02/2012 11:21   US Renal  12/02/2012   CLINICAL DATA:  Renal failure.  EXAM: RENAL/URINARY TRACT ULTRASOUND COMPLETE  COMPARISON:  07/20/2010.  FINDINGS: Right Kidney  Length: 8.5 cm.  Renal parenchyma is echogenic and irregular.  Left Kidney  Length: 11.4 cm.  Renal parenchyma is echogenic and irregular.  Bladder  Appears normal for degree of bladder distention.  IMPRESSION: Echogenic renal parenchyma with thick irregular contour consistent with chronic medical renal disease. No hydronephrosis. Bladder nondistended.   Electronically Signed   By: Marcello Moores  Register   On: 12/02/2012 10:46   Ct Biopsy  12/17/2012   INDICATION: Multiple myeloma  EXAM: CT BIOPSY  MEDICATIONS: Fentanyl 75 mcg IV; Versed 1.5 mg IV  ANESTHESIA/SEDATION: Sedation Time  10 minutes  CONTRAST:  None  PROCEDURE: Informed consent was obtained from the patient following an explanation of the procedure, risks, benefits and alternatives. The patient  understands, agrees and consents for the procedure. All questions were addressed. A time out was performed prior to the initiation of the procedure. The patient was positioned prone and noncontrast localization CT was performed of the pelvis to demonstrate the iliac marrow spaces. The operative site was prepped and draped in the usual sterile fashion.  Under sterile conditions and local anesthesia, an 11 gauge coaxial bone biopsy needle was advanced into the left iliac  marrow space. Needle position was confirmed with CT imaging. Initially, bone marrow aspiration was performed. Next, a bone marrow biopsy was obtained with the 11 gauge outer bone marrow device. Samples were prepared with the cytotechnologist and deemed adequate. The needle was removed intact. Hemostasis was obtained with compression and a dressing was placed. The patient tolerated the procedure well without immediate post procedural complication.  COMPLICATIONS: None immediate.  IMPRESSION: Successful CT guided left iliac bone marrow aspiration and core biopsies.   Electronically Signed   By: Sandi Mariscal M.D.   On: 12/17/2012 12:26   BONE MARROW REPORT FINAL DIAGNOSIS Diagnosis Bone Marrow, Aspirate,Biopsy, and Clot, left iliac - HYPERCELLULAR MARROW WITH MONOCLONAL PLASMACYTOSIS (10%). - SEE COMMENT. PERIPHERAL BLOOD: - NORMOCYTIC ANEMIA. Diagnosis Note The bone marrow is hypercellular with increased plasma cells (10% overall) with frequent small clusters. The plasma cells show a kappa predominance by immunohistochemistry. These findings are consistent with persistence/recurrence of the patient's prior plasma cell neoplasm. Vicente Males MD Pathologist, Electronic Signature (Case signed 12/19/2012)  ASSESSMENT AND PLAN: This is a very pleasant 72 years old white female with recurrent multiple myeloma as well as history of ductal carcinoma in situ. Her recent myeloma panel in addition to the bone marrow biopsy and aspirate showed  recurrence of her disease. She is currently being treated with systemic therapy with Carfilzomib, Cytoxan and Decadron. Overall she's tolerating the chemotherapy without difficulty. Patient was discussed with also seen by Dr. Julien Nordmann. To evaluate her lower extremity edema further she is being sent for Dopplers of the lower extremities to evaluate for possible DVT. Of note the patient's 2-D echo prior to initiating her systemic chemotherapy revealed an ejection fraction between 60-65%. For hypercalcemia, the patient will continue on monthly Zometa. For the renal insufficiency, she was advised to keep her appointment with Dr. Jimmy Footman and to increase her by mouth hydration.  The patient agreed to the current plan. Patient was discussed with also seen by Dr. Julien Nordmann. We will plan to recheck her protein studies after she has completed 2 cycles of this systemic chemotherapy to reevaluate her disease. She was advised to call immediately she has any concerning symptoms in the interval. She return for follow up visit in 2 weeks for evaluation and management any adverse effect of her treatment, as well as proceed with the start of cycle #2. She was advised to call immediately if she has any concerning symptoms in the interval. All questions were answered. The patient knows to call the clinic with any problems, questions or concerns. We can certainly see the patient much sooner if necessary.  Carlton Adam PA-C  ADDENDUM: Hematology/Oncology Attending: I had a face to face encounter with the patient. I recommended her care plan. She is a very pleasant 72 years old white female with recurrent multiple myeloma currently on treatment with Carfilzomib, Cytoxan and dexamethasone. She is here today for day 15 of the first cycle. She is tolerating her treatment fairly well with no significant adverse effects except for mild fatigue.  She denied having any significant chest pain, shortness of breath, cough or  hemoptysis. We will proceed with the treatment today as scheduled.  The patient would come back for follow up visit in 2 weeks with the start of cycle #2. She was advised to call immediately if she has any concerning symptoms in the interval. [name]., MD 01/13/2013

## 2013-01-13 ENCOUNTER — Encounter (HOSPITAL_COMMUNITY): Payer: Self-pay | Admitting: Emergency Medicine

## 2013-01-13 ENCOUNTER — Telehealth: Payer: Self-pay | Admitting: *Deleted

## 2013-01-13 ENCOUNTER — Other Ambulatory Visit: Payer: Self-pay

## 2013-01-13 ENCOUNTER — Ambulatory Visit (HOSPITAL_COMMUNITY)
Admission: RE | Admit: 2013-01-13 | Discharge: 2013-01-13 | Disposition: A | Discharge status: Home / Self Care | Payer: Medicare Other | Source: Ambulatory Visit | Attending: Internal Medicine | Admitting: Internal Medicine

## 2013-01-13 ENCOUNTER — Emergency Department (HOSPITAL_COMMUNITY): Payer: Medicare Other

## 2013-01-13 ENCOUNTER — Observation Stay (HOSPITAL_COMMUNITY)
Admission: EM | Admit: 2013-01-13 | Discharge: 2013-01-15 | Disposition: A | Discharge status: Home / Self Care | Payer: Medicare Other | Attending: Internal Medicine | Admitting: Internal Medicine

## 2013-01-13 ENCOUNTER — Observation Stay (HOSPITAL_COMMUNITY): Payer: Medicare Other

## 2013-01-13 ENCOUNTER — Ambulatory Visit (HOSPITAL_BASED_OUTPATIENT_CLINIC_OR_DEPARTMENT_OTHER): Payer: Medicare Other

## 2013-01-13 ENCOUNTER — Other Ambulatory Visit: Payer: Self-pay | Admitting: Physician Assistant

## 2013-01-13 DIAGNOSIS — D6481 Anemia due to antineoplastic chemotherapy: Secondary | ICD-10-CM | POA: Diagnosis not present

## 2013-01-13 DIAGNOSIS — I5030 Unspecified diastolic (congestive) heart failure: Secondary | ICD-10-CM | POA: Diagnosis not present

## 2013-01-13 DIAGNOSIS — N179 Acute kidney failure, unspecified: Secondary | ICD-10-CM | POA: Diagnosis not present

## 2013-01-13 DIAGNOSIS — R079 Chest pain, unspecified: Secondary | ICD-10-CM

## 2013-01-13 DIAGNOSIS — N189 Chronic kidney disease, unspecified: Secondary | ICD-10-CM | POA: Insufficient documentation

## 2013-01-13 DIAGNOSIS — C9000 Multiple myeloma not having achieved remission: Secondary | ICD-10-CM | POA: Insufficient documentation

## 2013-01-13 DIAGNOSIS — Z9484 Stem cells transplant status: Secondary | ICD-10-CM | POA: Insufficient documentation

## 2013-01-13 DIAGNOSIS — C9001 Multiple myeloma in remission: Secondary | ICD-10-CM

## 2013-01-13 DIAGNOSIS — M79609 Pain in unspecified limb: Secondary | ICD-10-CM | POA: Diagnosis not present

## 2013-01-13 DIAGNOSIS — M7989 Other specified soft tissue disorders: Secondary | ICD-10-CM | POA: Diagnosis not present

## 2013-01-13 DIAGNOSIS — I129 Hypertensive chronic kidney disease with stage 1 through stage 4 chronic kidney disease, or unspecified chronic kidney disease: Secondary | ICD-10-CM | POA: Insufficient documentation

## 2013-01-13 DIAGNOSIS — E039 Hypothyroidism, unspecified: Secondary | ICD-10-CM | POA: Diagnosis not present

## 2013-01-13 DIAGNOSIS — D0590 Unspecified type of carcinoma in situ of unspecified breast: Secondary | ICD-10-CM | POA: Insufficient documentation

## 2013-01-13 DIAGNOSIS — J449 Chronic obstructive pulmonary disease, unspecified: Secondary | ICD-10-CM | POA: Insufficient documentation

## 2013-01-13 DIAGNOSIS — R0600 Dyspnea, unspecified: Secondary | ICD-10-CM

## 2013-01-13 DIAGNOSIS — R0602 Shortness of breath: Secondary | ICD-10-CM

## 2013-01-13 DIAGNOSIS — Z79899 Other long term (current) drug therapy: Secondary | ICD-10-CM | POA: Diagnosis not present

## 2013-01-13 DIAGNOSIS — I5032 Chronic diastolic (congestive) heart failure: Secondary | ICD-10-CM | POA: Diagnosis not present

## 2013-01-13 DIAGNOSIS — J4489 Other specified chronic obstructive pulmonary disease: Principal | ICD-10-CM | POA: Insufficient documentation

## 2013-01-13 DIAGNOSIS — G479 Sleep disorder, unspecified: Secondary | ICD-10-CM | POA: Insufficient documentation

## 2013-01-13 DIAGNOSIS — R5381 Other malaise: Secondary | ICD-10-CM | POA: Diagnosis not present

## 2013-01-13 DIAGNOSIS — J45909 Unspecified asthma, uncomplicated: Secondary | ICD-10-CM | POA: Diagnosis present

## 2013-01-13 DIAGNOSIS — D649 Anemia, unspecified: Secondary | ICD-10-CM | POA: Diagnosis not present

## 2013-01-13 DIAGNOSIS — R600 Localized edema: Secondary | ICD-10-CM

## 2013-01-13 DIAGNOSIS — T451X5A Adverse effect of antineoplastic and immunosuppressive drugs, initial encounter: Secondary | ICD-10-CM | POA: Insufficient documentation

## 2013-01-13 DIAGNOSIS — Z5112 Encounter for antineoplastic immunotherapy: Secondary | ICD-10-CM | POA: Diagnosis not present

## 2013-01-13 DIAGNOSIS — C9002 Multiple myeloma in relapse: Secondary | ICD-10-CM | POA: Diagnosis not present

## 2013-01-13 DIAGNOSIS — Z9221 Personal history of antineoplastic chemotherapy: Secondary | ICD-10-CM | POA: Diagnosis not present

## 2013-01-13 DIAGNOSIS — Z853 Personal history of malignant neoplasm of breast: Secondary | ICD-10-CM

## 2013-01-13 DIAGNOSIS — I509 Heart failure, unspecified: Secondary | ICD-10-CM | POA: Diagnosis not present

## 2013-01-13 DIAGNOSIS — I10 Essential (primary) hypertension: Secondary | ICD-10-CM | POA: Diagnosis present

## 2013-01-13 LAB — BASIC METABOLIC PANEL
CO2: 17 mEq/L — ABNORMAL LOW (ref 19–32)
Calcium: 8.2 mg/dL — ABNORMAL LOW (ref 8.4–10.5)
GFR calc non Af Amer: 18 mL/min — ABNORMAL LOW (ref >90)
Sodium: 141 mEq/L (ref 137–147)

## 2013-01-13 LAB — CBC WITH DIFFERENTIAL/PLATELET
Basophils Absolute: 0 10*3/uL (ref 0.0–0.1)
Basophils Relative: 0 % (ref 0–1)
Eosinophils Absolute: 0 10*3/uL (ref 0.0–0.7)
Eosinophils Relative: 0 % (ref 0–5)
HCT: 20.2 % — ABNORMAL LOW (ref 36.0–46.0)
Lymphocytes Relative: 7 % — ABNORMAL LOW (ref 12–46)
MCHC: 33.7 g/dL (ref 30.0–36.0)
MCV: 94.4 fL (ref 78.0–100.0)
Monocytes Absolute: 0.5 10*3/uL (ref 0.1–1.0)
Neutrophils Relative %: 88 % — ABNORMAL HIGH (ref 43–77)
Platelets: 168 10*3/uL (ref 150–400)
RDW: 13.8 % (ref 11.5–15.5)
WBC: 8.7 10*3/uL (ref 4.0–10.5)

## 2013-01-13 LAB — POCT I-STAT TROPONIN I: Troponin i, poc: 0.01 ng/mL (ref 0.00–0.08)

## 2013-01-13 LAB — PRO B NATRIURETIC PEPTIDE: Pro B Natriuretic peptide (BNP): 3915 pg/mL — ABNORMAL HIGH (ref 0–125)

## 2013-01-13 MED ORDER — DEXAMETHASONE SODIUM PHOSPHATE 10 MG/ML IJ SOLN
10.0000 mg | Freq: Once | INTRAMUSCULAR | Status: AC
  Administered 2013-01-13: 10 mg via INTRAVENOUS

## 2013-01-13 MED ORDER — MAGIC MOUTHWASH W/LIDOCAINE
5.0000 mL | Freq: Four times a day (QID) | ORAL | Status: DC | PRN
  Filled 2013-01-13: qty 5

## 2013-01-13 MED ORDER — SODIUM CHLORIDE 0.9 % IJ SOLN
10.0000 mL | INTRAMUSCULAR | Status: DC | PRN
  Administered 2013-01-13: 10 mL
  Filled 2013-01-13: qty 10

## 2013-01-13 MED ORDER — FUROSEMIDE 10 MG/ML IJ SOLN
20.0000 mg | Freq: Two times a day (BID) | INTRAMUSCULAR | Status: AC | PRN
  Administered 2013-01-13 (×2): 20 mg via INTRAVENOUS
  Filled 2013-01-13: qty 4

## 2013-01-13 MED ORDER — ZOLPIDEM TARTRATE 5 MG PO TABS
5.0000 mg | ORAL_TABLET | Freq: Every evening | ORAL | Status: DC | PRN
  Administered 2013-01-13 – 2013-01-14 (×2): 5 mg via ORAL
  Filled 2013-01-13 (×2): qty 1

## 2013-01-13 MED ORDER — DEXAMETHASONE SODIUM PHOSPHATE 20 MG/5ML IJ SOLN
INTRAMUSCULAR | Status: AC
  Filled 2013-01-13: qty 5

## 2013-01-13 MED ORDER — HEPARIN SOD (PORK) LOCK FLUSH 100 UNIT/ML IV SOLN
500.0000 [IU] | Freq: Once | INTRAVENOUS | Status: AC | PRN
  Administered 2013-01-13: 500 [IU]
  Filled 2013-01-13: qty 5

## 2013-01-13 MED ORDER — ONDANSETRON 4 MG PO TBDP
4.0000 mg | ORAL_TABLET | Freq: Three times a day (TID) | ORAL | Status: DC | PRN
  Administered 2013-01-13 – 2013-01-14 (×2): 4 mg via ORAL
  Filled 2013-01-13 (×3): qty 1

## 2013-01-13 MED ORDER — SODIUM CHLORIDE 0.9 % IV SOLN
Freq: Once | INTRAVENOUS | Status: AC
  Administered 2013-01-13: 09:00:00 via INTRAVENOUS

## 2013-01-13 MED ORDER — SODIUM CHLORIDE 0.9 % IV BOLUS (SEPSIS)
1000.0000 mL | Freq: Once | INTRAVENOUS | Status: AC
  Administered 2013-01-13: 1000 mL via INTRAVENOUS

## 2013-01-13 MED ORDER — LOTEPREDNOL ETABONATE 0.5 % OP SUSP
1.0000 [drp] | Freq: Three times a day (TID) | OPHTHALMIC | Status: DC
  Administered 2013-01-14 (×3): 1 [drp] via OPHTHALMIC
  Filled 2013-01-13: qty 5

## 2013-01-13 MED ORDER — LEVOTHYROXINE SODIUM 150 MCG PO TABS: 150.0000 ug | ORAL_TABLET | Freq: Every day | ORAL | Status: DC

## 2013-01-13 MED ORDER — DEXAMETHASONE SODIUM PHOSPHATE 10 MG/ML IJ SOLN
INTRAMUSCULAR | Status: AC
  Filled 2013-01-13: qty 1

## 2013-01-13 MED ORDER — LEVOTHYROXINE SODIUM 150 MCG PO TABS
150.0000 ug | ORAL_TABLET | ORAL | Status: DC
  Administered 2013-01-14 – 2013-01-15 (×2): 150 ug via ORAL
  Filled 2013-01-13 (×3): qty 1

## 2013-01-13 MED ORDER — CROMOLYN SODIUM 4 % OP SOLN
1.0000 [drp] | Freq: Four times a day (QID) | OPHTHALMIC | Status: DC
  Administered 2013-01-13 – 2013-01-14 (×5): 1 [drp] via OPHTHALMIC
  Filled 2013-01-13: qty 10

## 2013-01-13 MED ORDER — PANTOPRAZOLE SODIUM 40 MG PO TBEC
40.0000 mg | DELAYED_RELEASE_TABLET | Freq: Two times a day (BID) | ORAL | Status: DC
  Administered 2013-01-13 – 2013-01-14 (×3): 40 mg via ORAL
  Filled 2013-01-13 (×5): qty 1

## 2013-01-13 MED ORDER — FERROUS SULFATE 325 (65 FE) MG PO TABS
325.0000 mg | ORAL_TABLET | ORAL | Status: DC
  Administered 2013-01-14: 09:00:00 325 mg via ORAL
  Filled 2013-01-13: qty 1

## 2013-01-13 MED ORDER — SODIUM CHLORIDE 0.9 % IV SOLN
Freq: Once | INTRAVENOUS | Status: AC
  Administered 2013-01-13: 11:00:00 via INTRAVENOUS

## 2013-01-13 MED ORDER — ONDANSETRON 8 MG/NS 50 ML IVPB
INTRAVENOUS | Status: AC
  Filled 2013-01-13: qty 8

## 2013-01-13 MED ORDER — DEXAMETHASONE 4 MG PO TABS: 4.0000 mg | ORAL_TABLET | ORAL | Status: DC

## 2013-01-13 MED ORDER — DEXTROSE 5 % IV SOLN
36.0000 mg/m2 | Freq: Once | INTRAVENOUS | Status: AC
  Administered 2013-01-13: 70 mg via INTRAVENOUS
  Filled 2013-01-13: qty 35

## 2013-01-13 MED ORDER — ALPRAZOLAM 0.25 MG PO TABS
0.2500 mg | ORAL_TABLET | Freq: Every evening | ORAL | Status: DC | PRN
  Administered 2013-01-13 – 2013-01-14 (×2): 0.25 mg via ORAL
  Filled 2013-01-13 (×2): qty 1

## 2013-01-13 MED ORDER — LORATADINE 10 MG PO TABS
10.0000 mg | ORAL_TABLET | Freq: Every day | ORAL | Status: DC
  Administered 2013-01-14: 10 mg via ORAL
  Filled 2013-01-13 (×2): qty 1

## 2013-01-13 MED ORDER — LOTEPREDNOL ETABONATE 0.2 % OP SUSP: 1.0000 [drp] | Freq: Three times a day (TID) | OPHTHALMIC | Status: DC

## 2013-01-13 MED ORDER — LEVOTHYROXINE SODIUM 25 MCG PO TABS: 225.0000 ug | ORAL_TABLET | ORAL | Status: DC

## 2013-01-13 MED ORDER — ALBUTEROL SULFATE (2.5 MG/3ML) 0.083% IN NEBU
2.5000 mg | INHALATION_SOLUTION | Freq: Four times a day (QID) | RESPIRATORY_TRACT | Status: DC | PRN
  Administered 2013-01-13 – 2013-01-14 (×2): 2.5 mg via RESPIRATORY_TRACT
  Filled 2013-01-13 (×2): qty 3

## 2013-01-13 MED ORDER — ACETAMINOPHEN 325 MG PO TABS
650.0000 mg | ORAL_TABLET | Freq: Four times a day (QID) | ORAL | Status: DC | PRN
  Administered 2013-01-14: 650 mg via ORAL
  Filled 2013-01-13: qty 2

## 2013-01-13 MED ORDER — GABAPENTIN 300 MG PO CAPS
300.0000 mg | ORAL_CAPSULE | Freq: Every day | ORAL | Status: DC
  Administered 2013-01-13 – 2013-01-14 (×2): 300 mg via ORAL
  Filled 2013-01-13 (×4): qty 1

## 2013-01-13 MED ORDER — ASPIRIN 81 MG PO CHEW
324.0000 mg | CHEWABLE_TABLET | Freq: Once | ORAL | Status: AC
  Administered 2013-01-13: 324 mg via ORAL
  Filled 2013-01-13: qty 4

## 2013-01-13 MED ORDER — ONDANSETRON 8 MG/50ML IVPB (CHCC)
8.0000 mg | Freq: Once | INTRAVENOUS | Status: AC
Start: 2013-01-13 — End: 2013-01-13
  Administered 2013-01-13: 8 mg via INTRAVENOUS

## 2013-01-13 MED ORDER — FERROUS SULFATE 134 MG PO TABS: 1.0000 | ORAL_TABLET | ORAL | Status: DC

## 2013-01-13 MED ORDER — CITALOPRAM HYDROBROMIDE 40 MG PO TABS
40.0000 mg | ORAL_TABLET | Freq: Every day | ORAL | Status: DC
  Administered 2013-01-14 – 2013-01-15 (×2): 40 mg via ORAL
  Filled 2013-01-13 (×3): qty 1

## 2013-01-13 NOTE — ED Provider Notes (Addendum)
TIME SEEN: 12:51 PM  CHIEF COMPLAINT: Chest pain, dyspnea with exertion, fatigue  HPI: Patient is a 72 y.o. female with a history of hypothyroidism, asthma and COPD, prior history of breast cancer status post mastectomy, multiple myeloma currently on chemotherapy who presents the emergency department with one week of right-sided sharp chest pain is worse with inspiration and exertion, shortness of breath with exertion and generalized fatigue. Patient reports her last chemotherapy was yesterday and today. She has a history of asthma and states she has been wheezing but her lungs are currently clear. She recently had a negative venous Dopplers of her lower extremities. No prior history of pulmonary embolus or DVT. No recent lower extremity swelling or pain. No fever or cough. No history of cardiac disease, MI, stents.  ROS: See HPI Constitutional: no fever  Eyes: no drainage  ENT: no runny nose   Cardiovascular:  Right-sided chest pain  Resp:  SOB  GI: no vomiting GU: no dysuria Integumentary: no rash  Allergy: no hives  Musculoskeletal: no leg swelling  Neurological: no slurred speech ROS otherwise negative  PAST MEDICAL HISTORY/PAST SURGICAL HISTORY:  Past Medical History  Diagnosis Date  . Hyperkalemia   . Hypothyroidism   . COPD (chronic obstructive pulmonary disease)   . Hyponatremia   . Dizziness   . Fibromyalgia   . Breast cancer   . Multiple myeloma   . Mucositis     MEDICATIONS:  Prior to Admission medications   Medication Sig Start Date End Date Taking? Authorizing Provider  acetaminophen (TYLENOL) 325 MG tablet Take 650 mg by mouth every 6 (six) hours as needed for mild pain or moderate pain.     Historical Provider, MD  albuterol (PROVENTIL HFA;VENTOLIN HFA) 108 (90 BASE) MCG/ACT inhaler Inhale 2 puffs into the lungs 2 (two) times daily as needed for wheezing or shortness of breath. 12/12/12   Curt Bears, MD  Alum & Mag Hydroxide-Simeth (MAGIC MOUTHWASH  W/LIDOCAINE) SOLN Take 5 mLs by mouth 4 (four) times daily as needed for mouth pain (swish and spit QID PRN). 12/31/12   Curt Bears, MD  ascorbic acid (VITAMIN C) 250 MG CHEW Chew 250 mg by mouth daily.    Historical Provider, MD  cetirizine (ZYRTEC) 10 MG tablet Take 10 mg by mouth at bedtime.     Historical Provider, MD  Cholecalciferol (VITAMIN D3) 10000 UNITS capsule Take 10,000 Units by mouth every other day.    Historical Provider, MD  citalopram (CELEXA) 40 MG tablet Take 40 mg by mouth daily with breakfast.     Historical Provider, MD  Cranberry 300 MG tablet Take 300 mg by mouth daily.    Historical Provider, MD  cromolyn (OPTICROM) 4 % ophthalmic solution Place 1 drop into both eyes 4 (four) times daily.    Historical Provider, MD  cyanocobalamin 2000 MCG tablet Take 2,000 mcg by mouth daily.     Historical Provider, MD  dexamethasone (DECADRON) 4 MG tablet 10 tablets by mouth on a weekly basis starting with the first dose of chemotherapy. 12/25/12   Curt Bears, MD  Ferrous Sulfate 134 MG TABS Take 1 tablet by mouth every other day.     Historical Provider, MD  gabapentin (NEURONTIN) 300 MG capsule Take 300 mg by mouth at bedtime.    Historical Provider, MD  levothyroxine (SYNTHROID, LEVOTHROID) 150 MCG tablet Take 150-225 mcg by mouth daily. Takes 1 tablet daily except Saturday and Sunday she takes 1 and 1/2    Historical Provider, MD  lidocaine-prilocaine (EMLA) cream Apply 1 application topically as needed. Apply to port 1 hour before chemotherapy appt. 12/31/12   Curt Bears, MD  loteprednol (LOTEMAX) 0.2 % SUSP Place 1 drop into both eyes 3 (three) times daily.     Historical Provider, MD  Multiple Vitamins-Minerals (HAIR/SKIN/NAILS PO) Take 1 tablet by mouth daily.    Historical Provider, MD  pantoprazole (PROTONIX) 40 MG tablet Take 40 mg by mouth 2 (two) times daily.     Historical Provider, MD  potassium chloride SA (K-DUR,KLOR-CON) 20 MEQ tablet Take 20 mEq by mouth  daily.    Historical Provider, MD  pregabalin (LYRICA) 50 MG capsule Take 50 mg by mouth at bedtime.     Historical Provider, MD  prochlorperazine (COMPAZINE) 10 MG tablet Take 1 tablet (10 mg total) by mouth every 6 (six) hours as needed for nausea or vomiting. 12/25/12   Curt Bears, MD    ALLERGIES:  Allergies  Allergen Reactions  . Codeine Anaphylaxis  . Latex Shortness Of Breath    Adhesive products   . Iodinated Diagnostic Agents Itching    Happened 40 years ago  . Other     Onion, chocolate causes headache  . Sulfa Antibiotics Itching  . Adhesive [Tape]     blisters    SOCIAL HISTORY:  History  Substance Use Topics  . Smoking status: Former Smoker -- 1.00 packs/day for 30 years    Types: Cigarettes    Quit date: 02/15/2006  . Smokeless tobacco: Never Used  . Alcohol Use: No    FAMILY HISTORY: Family History  Problem Relation Age of Onset  . Arthritis Mother   . Asthma Mother     EXAM: BP 145/73  Pulse 72  Temp(Src) 98.1 F (36.7 C) (Oral)  Resp 16  SpO2 100% CONSTITUTIONAL: Alert and oriented and responds appropriately to questions. Well-appearing; well-nourished HEAD: Normocephalic EYES: Conjunctivae clear, PERRL ENT: normal nose; no rhinorrhea; moist mucous membranes; pharynx without lesions noted NECK: Supple, no meningismus, no LAD  CARD: RRR; S1 and S2 appreciated; no murmurs, no clicks, no rubs, no gallops RESP: Normal chest excursion without splinting or tachypnea; breath sounds clear and equal bilaterally; no wheezes, no rhonchi, no rales, no respiratory distress ABD/GI: Normal bowel sounds; non-distended; soft, non-tender, no rebound, no guarding BACK:  The back appears normal and is non-tender to palpation, there is no CVA tenderness EXT: Normal ROM in all joints; non-tender to palpation; no edema; normal capillary refill; no cyanosis    SKIN: Normal color for age and race; warm NEURO: Moves all extremities equally PSYCH: The patient's mood  and manner are appropriate. Grooming and personal hygiene are appropriate.  MEDICAL DECISION MAKING: Patient here with right-sided chest pain and shortness of breath on exertion. Her lungs are clear to auscultation and she is hemodynamically stable. No respiratory distress or hypoxia. No cardiac history. Concern for possible pulmonary embolus. Will obtain labs, chest x-ray, CT chest.  ED PROGRESS: Patient's hemoglobin is 6.8. She denies any vaginal bleeding, bloody stools or melena. Her anemia is likely secondary to her chemotherapy. She states her last transfusion was many years ago. Have consented for blood and will give 2 units. CT chest is pending. She is still hemodynamically stable. She is comfortable plan for admission. Discussed with Dr. Maryland Pink, hospitalist, for admission.   EKG Interpretation    Date/Time:  Tuesday January 13 2013 12:32:25 EST Ventricular Rate:  69 PR Interval:  198 QRS Duration: 83 QT Interval:  426 QTC Calculation: 456  R Axis:   39 Text Interpretation:  Sinus rhythm Probable left atrial enlargement Low voltage, precordial leads No significant change since last tracing Confirmed by Mychaela Lennartz  DO, Aranza Geddes [id number]) on 01/13/2013 3:51:28 PM              [place], DO 01/13/13 [place], DO 01/13/13 1551

## 2013-01-13 NOTE — Progress Notes (Signed)
Bilateral lower extremity venous duplex:  No evidence of DVT, superficial thrombosis, or Baker's Cyst.   

## 2013-01-13 NOTE — H&P (Signed)
[facility] History and Physical  Nancy Norris [id number] DOB: [birth year] DOA: 01/13/2013  Referring physician: Pryor Curia, ER physician PCP: [name], MD   Chief Complaint: Shortness of breath  HPI: Nancy Norris is a 72 y.o. female  Past medical history of asthma, chronic diastolic heart failure and multiple myeloma in remission who started having shortness of breath today, with markedly onset with exertion. Rest it was not too bad, but when exerting herself, patient noted significant shortness of breath and also some chest pain on the right-hand side starting mid sternal/epigastric and radiating to the right.  Patient came into the emergency room for evaluation. Chest x-ray was negative. Lung fields were clean.  Patient's labs for the most part were within baseline and actually her renal function is better than normal. Patient's hemoglobin however was down to 6.8, when yesterday morning it was 8.7 checked in the office. Patient is reported no episodes of dark black stool or bloody stool this is felt more likely be related to her myeloma and chemotherapy. Patient was ordered 2 units packed red blood cells. Because of concerns for blood clot in patient with history of cancer and description of right-sided chest pain, CT angiogram was ordered, however this was canceled because of patient's renal failure-which is chronic. Hospitalists were called for further evaluation and admission.   Review of Systems:  Patient seen after transfer to floor. Doing okay. Still complains of shortness of breath sensation, although oxygen saturations are 99% on room air and she is sitting upright. She denies any headaches, vision changes or dysphagia. She does feel mildly nauseated. She also feels quite exhausted as she had chemotherapy yesterday and usually after chemotherapy, she is a difficult time sleeping and she only said maybe a couple of hours last night. She denies any current chest  pain or palpitations. She says when she is at rest she has no  short of breath and certainly no chest pain right now. Denies abdominal pain, hematuria, dysuria, constipation, diarrhea, focal tenderness weakness or pain. Review systems otherwise negative.  Past Medical History  Diagnosis Date  . Hyperkalemia   . Hypothyroidism   . COPD (chronic obstructive pulmonary disease)   . Hyponatremia   . Dizziness   . Fibromyalgia   . Breast cancer   . Multiple myeloma   . Mucositis    Past Surgical History  Procedure Laterality Date  . History of port removal    . Status post stem cell transplant on September 28, 2008.    Marland Kitchen Abdominal hysterectomy  1981  . Cholecystectomy  1971  . Mastectomy Left 2008  . Cataract extraction, bilateral     Social History:  reports that she quit smoking about 6 years ago. Her smoking use included Cigarettes. She has a 30 pack-year smoking history. She has never used smokeless tobacco. She reports that she does not drink alcohol or use illicit drugs. patient lives at home alone. She is normally able to ambulate without use of the walker or cane.  Allergies  Allergen Reactions  . Codeine Anaphylaxis  . Latex Shortness Of Breath    Adhesive products   . Iodinated Diagnostic Agents Itching    Happened 40 years ago  . Other     Onion, chocolate causes headache  . Sulfa Antibiotics Itching  . Adhesive [Tape]     blisters    Family History  Problem Relation Age of Onset  . Arthritis Mother   . Asthma Mother  Prior to Admission medications   Medication Sig Start Date End Date Taking? Authorizing Provider  acetaminophen (TYLENOL) 325 MG tablet Take 650 mg by mouth every 6 (six) hours as needed for mild pain or moderate pain.    Yes Historical Provider, MD  albuterol (PROVENTIL HFA;VENTOLIN HFA) 108 (90 BASE) MCG/ACT inhaler Inhale 2 puffs into the lungs 2 (two) times daily as needed for wheezing or shortness of breath. 12/12/12  Yes Curt Bears, MD   Alum & Mag Hydroxide-Simeth (MAGIC MOUTHWASH W/LIDOCAINE) SOLN Take 5 mLs by mouth 4 (four) times daily as needed for mouth pain (swish and spit QID PRN). 12/31/12  Yes Curt Bears, MD  ascorbic acid (VITAMIN C) 250 MG CHEW Chew 250 mg by mouth daily.   Yes Historical Provider, MD  cetirizine (ZYRTEC) 10 MG tablet Take 10 mg by mouth at bedtime.    Yes Historical Provider, MD  Cholecalciferol (VITAMIN D3) 10000 UNITS capsule Take 10,000 Units by mouth every other day.   Yes Historical Provider, MD  citalopram (CELEXA) 40 MG tablet Take 40 mg by mouth daily with breakfast.    Yes Historical Provider, MD  Cranberry 300 MG tablet Take 300 mg by mouth daily.   Yes Historical Provider, MD  cromolyn (OPTICROM) 4 % ophthalmic solution Place 1 drop into both eyes 4 (four) times daily.   Yes Historical Provider, MD  cyanocobalamin 2000 MCG tablet Take 2,000 mcg by mouth daily.    Yes Historical Provider, MD  dexamethasone (DECADRON) 4 MG tablet 10 tablets by mouth on a weekly basis starting with the first dose of chemotherapy. 12/25/12  Yes Curt Bears, MD  Ferrous Sulfate 134 MG TABS Take 1 tablet by mouth every other day.    Yes Historical Provider, MD  gabapentin (NEURONTIN) 300 MG capsule Take 300 mg by mouth at bedtime.   Yes Historical Provider, MD  levothyroxine (SYNTHROID, LEVOTHROID) 150 MCG tablet Take 150-225 mcg by mouth daily. Takes 1 tablet daily except Saturday and Sunday she takes 1 and 1/2   Yes Historical Provider, MD  lidocaine-prilocaine (EMLA) cream Apply 1 application topically as needed. Apply to port 1 hour before chemotherapy appt. 12/31/12  Yes Curt Bears, MD  loteprednol (LOTEMAX) 0.2 % SUSP Place 1 drop into both eyes 3 (three) times daily.    Yes Historical Provider, MD  Multiple Vitamins-Minerals (HAIR/SKIN/NAILS PO) Take 1 tablet by mouth daily.   Yes Historical Provider, MD  pantoprazole (PROTONIX) 40 MG tablet Take 40 mg by mouth 2 (two) times daily.    Yes  Historical Provider, MD  prochlorperazine (COMPAZINE) 10 MG tablet Take 1 tablet (10 mg total) by mouth every 6 (six) hours as needed for nausea or vomiting. 12/25/12  Yes Curt Bears, MD   Physical Exam: Filed Vitals:   01/13/13 2005  BP: 141/71  Pulse: 74  Temp: 98.4 F (36.9 C)  Resp: 20    BP 141/71  Pulse 74  Temp(Src) 98.4 F (36.9 C) (Oral)  Resp 20  SpO2 99%  General: Alert and oriented x3, no acute distress HEENT: Normocephalic, atraumatic, mucous membranes are slightly dry Cardiovascular: Regular rate and rhythm, S1-S2 Lungs: Clear to auscultation bilaterally Abdomen: Soft, obese, and nontender, positive bowel sounds Extremities: No clubbing or cyanosis or edema Skin: No skin breaks, tears or lesions. Note port present. Psychiatric: Patient is appropriate, no evidence of psychoses Neurologic: No focal deficits          Labs on Admission:  Basic Metabolic Panel:  Recent Labs  Lab 01/12/13 0847 01/13/13 1358  NA 141 141  K 3.8 4.1  CL  --  109  CO2 18* 17*  GLUCOSE 124 154*  BUN 27.7* 32*  CREATININE 2.5* 2.50*  CALCIUM 8.9 8.2*   Liver Function Tests:  Recent Labs Lab 01/12/13 0847  AST 20  ALT 31  ALKPHOS 147  BILITOT 0.63  PROT 6.7  ALBUMIN 3.4*   No results found for this basename: LIPASE, AMYLASE,  in the last 168 hours No results found for this basename: AMMONIA,  in the last 168 hours CBC:  Recent Labs Lab 01/12/13 0844 01/13/13 1358  WBC 5.4 8.7  NEUTROABS 4.3 7.7  HGB 8.7* 6.8*  HCT 26.6* 20.2*  MCV 93.7 94.4  PLT 186 168   Cardiac Enzymes: No results found for this basename: CKTOTAL, CKMB, CKMBINDEX, TROPONINI,  in the last 168 hours  BNP (last 3 results) No results found for this basename: PROBNP,  in the last 8760 hours CBG: No results found for this basename: GLUCAP,  in the last 168 hours  Radiological Exams on Admission: Dg Chest 2 View  01/13/2013   CLINICAL DATA:  Shortness of breath.  EXAM: CHEST  2  VIEW  COMPARISON:  12/02/2012.  FINDINGS: Port-A-Cath in good anatomic position. Poor inspiration. No definite pulmonary infiltrate. No pleural effusion or pneumothorax. Borderline cardiomegaly. No acute bony abnormality. Surgical clips noted over the anterior chest.  IMPRESSION: 1. Port-A-Cath in good anatomic position. 2. Borderline cardiomegaly with mild pulmonary vascular prominence. No overt congestive heart failure.   Electronically Signed   By: Marcello Moores  Register   On: 01/13/2013 13:43    EKG: Independently reviewed. Sinus rhythm  Assessment/Plan Principal Problem:   Shortness of breath: Possible this could be a pulmonary embolus, certainly need to consider given history of cancer, but less likely. Cannot check CT angiogram, but we'll check a ventilation perfusion scan. Also could be related to anemia. Patient receiving blood. In addition, patient does have a history of diastolic heart failure. We'll check BNP. Active Problems:   Multiple myeloma in remission: Stable. Continue chemotherapy as outpatient   Anemia: Secondary chemotherapy. No evidence of GI bleed. Getting 2 units packed red blood cells. We'll give Lasix after each unit to avoid volume overload.   Sleep disturbance: We'll try when necessary Ambien versus Xanax   Asthma, chronic: Stable. When necessary nebulizer   Hypothyroid: Continue Synthroid Diastolic heart failure: Checking BNP, echocardiogram done earlier this month   Code Status: Full code  Family Communication: Left message with daughter.  Disposition Plan: If the VQ scan negative, hemoglobin stable and patient feeling better, discharged home tomorrow  Time spent: 35 minutes  [facility] Pager [phone]

## 2013-01-13 NOTE — Telephone Encounter (Signed)
Pt in the lobby, spoke to [employer], [place].  Pt has new increased SOB, wheezing, pain in her right side.  Per Dr Vista Mink and Delos Haring, pt was told to go to the ED.  SLJ

## 2013-01-13 NOTE — ED Notes (Signed)
Pt comes from cancer center appointment (pt has multiple myloma). Pt states she has been having progressively worse shortness of breath and wheezing since Wednesday of last week. Pt states she was walking from [place] center to [place] and felt like her "chest was going to explode". Pt describes her chest pain as tight and burning. Pt also has pain in her right abdomen when coughing.

## 2013-01-14 ENCOUNTER — Telehealth: Payer: Self-pay | Admitting: Internal Medicine

## 2013-01-14 ENCOUNTER — Encounter (HOSPITAL_COMMUNITY)
Admit: 2013-01-14 | Discharge: 2013-01-14 | Disposition: A | Discharge status: Home / Self Care | Payer: Medicare Other | Source: Ambulatory Visit | Attending: Internal Medicine | Admitting: Internal Medicine

## 2013-01-14 DIAGNOSIS — C9001 Multiple myeloma in remission: Secondary | ICD-10-CM | POA: Diagnosis not present

## 2013-01-14 DIAGNOSIS — I10 Essential (primary) hypertension: Secondary | ICD-10-CM | POA: Diagnosis present

## 2013-01-14 DIAGNOSIS — R0989 Other specified symptoms and signs involving the circulatory and respiratory systems: Secondary | ICD-10-CM

## 2013-01-14 DIAGNOSIS — N179 Acute kidney failure, unspecified: Secondary | ICD-10-CM

## 2013-01-14 DIAGNOSIS — R079 Chest pain, unspecified: Secondary | ICD-10-CM | POA: Insufficient documentation

## 2013-01-14 DIAGNOSIS — R0602 Shortness of breath: Secondary | ICD-10-CM | POA: Diagnosis not present

## 2013-01-14 DIAGNOSIS — T451X5A Adverse effect of antineoplastic and immunosuppressive drugs, initial encounter: Secondary | ICD-10-CM

## 2013-01-14 DIAGNOSIS — N289 Disorder of kidney and ureter, unspecified: Secondary | ICD-10-CM

## 2013-01-14 DIAGNOSIS — Z87898 Personal history of other specified conditions: Secondary | ICD-10-CM | POA: Insufficient documentation

## 2013-01-14 DIAGNOSIS — D6481 Anemia due to antineoplastic chemotherapy: Secondary | ICD-10-CM | POA: Diagnosis not present

## 2013-01-14 DIAGNOSIS — J45909 Unspecified asthma, uncomplicated: Secondary | ICD-10-CM

## 2013-01-14 DIAGNOSIS — E039 Hypothyroidism, unspecified: Secondary | ICD-10-CM

## 2013-01-14 DIAGNOSIS — I5032 Chronic diastolic (congestive) heart failure: Secondary | ICD-10-CM | POA: Diagnosis not present

## 2013-01-14 DIAGNOSIS — N189 Chronic kidney disease, unspecified: Secondary | ICD-10-CM | POA: Diagnosis not present

## 2013-01-14 DIAGNOSIS — R0609 Other forms of dyspnea: Secondary | ICD-10-CM

## 2013-01-14 DIAGNOSIS — D649 Anemia, unspecified: Secondary | ICD-10-CM | POA: Diagnosis not present

## 2013-01-14 LAB — TYPE AND SCREEN
Unit division: 0
Unit division: 0

## 2013-01-14 LAB — CBC
HCT: 28.8 % — ABNORMAL LOW (ref 36.0–46.0)
Hemoglobin: 9.7 g/dL — ABNORMAL LOW (ref 12.0–15.0)
MCH: 31.3 pg (ref 26.0–34.0)
MCV: 92.9 fL (ref 78.0–100.0)
Platelets: 119 10*3/uL — ABNORMAL LOW (ref 150–400)
RDW: 15.1 % (ref 11.5–15.5)
WBC: 3.6 10*3/uL — ABNORMAL LOW (ref 4.0–10.5)

## 2013-01-14 LAB — BASIC METABOLIC PANEL
BUN: 29 mg/dL — ABNORMAL HIGH (ref 6–23)
CO2: 18 mEq/L — ABNORMAL LOW (ref 19–32)
Calcium: 7.7 mg/dL — ABNORMAL LOW (ref 8.4–10.5)
Chloride: 108 mEq/L (ref 96–112)
Creatinine, Ser: 2.56 mg/dL — ABNORMAL HIGH (ref 0.50–1.10)
GFR calc Af Amer: 21 mL/min — ABNORMAL LOW (ref >90)

## 2013-01-14 MED ORDER — IPRATROPIUM-ALBUTEROL 0.5-2.5 (3) MG/3ML IN SOLN
3.0000 mL | Freq: Four times a day (QID) | RESPIRATORY_TRACT | Status: DC
  Administered 2013-01-14: 19:00:00 3 mL via RESPIRATORY_TRACT
  Filled 2013-01-14 (×29): qty 3

## 2013-01-14 MED ORDER — TECHNETIUM TO 99M ALBUMIN AGGREGATED
6.0000 | Freq: Once | INTRAVENOUS | Status: AC | PRN
  Administered 2013-01-14: 6 via INTRAVENOUS

## 2013-01-14 MED ORDER — FUROSEMIDE 40 MG PO TABS
40.0000 mg | ORAL_TABLET | Freq: Once | ORAL | Status: AC
  Administered 2013-01-14: 40 mg via ORAL
  Filled 2013-01-14: qty 1

## 2013-01-14 MED ORDER — SODIUM CHLORIDE 0.9 % IJ SOLN
10.0000 mL | INTRAMUSCULAR | Status: DC | PRN
  Administered 2013-01-14: 10 mL

## 2013-01-14 MED ORDER — TECHNETIUM TC 99M DIETHYLENETRIAME-PENTAACETIC ACID: 40.0000 | Freq: Once | INTRAVENOUS | Status: AC | PRN

## 2013-01-14 MED ORDER — HYDROCORTISONE 1 % EX CREA
TOPICAL_CREAM | Freq: Two times a day (BID) | CUTANEOUS | Status: DC
  Administered 2013-01-14: 22:00:00 via TOPICAL
  Filled 2013-01-14: qty 28

## 2013-01-14 MED ORDER — CARVEDILOL 3.125 MG PO TABS
3.1250 mg | ORAL_TABLET | Freq: Two times a day (BID) | ORAL | Status: DC
  Administered 2013-01-14 – 2013-01-15 (×2): 3.125 mg via ORAL
  Filled 2013-01-14 (×4): qty 1

## 2013-01-14 NOTE — Telephone Encounter (Signed)
s.w. pt in hospital and she needed labs the same day as tx...done

## 2013-01-14 NOTE — Progress Notes (Addendum)
[facility] PROGRESS NOTE  Nancy Norris [id number] DOB: 02-07-1940 DOA: 01/13/2013 PCP: [name], MD  Assessment/Plan:  Diastolic CHF -Although patient's CXR did not support fluid overload patient still experiencing some SOB, it has gotten a large fluid load with PRBC x2 units and IV fluids overnight. Treat Lasix 40 mg x1 -ProBNP= 3915 upon admission  Asthma -Patient on no asthma medication will start DuoNeb scheduled overnight -Q shift ambulatory SpO2 measurement -Hopefully patient will be ready for discharge in the a.m.  SOB -See asthma  Hypothyroid -Continue home medication   Multiple myeloma Per Dr. Inda Merlin (oncology) patient received below chemotherapy treatment PRIOR THERAPY:  1. Status post treatment with tamoxifen from November 2008 through February 2009, discontinued secondary to intolerance. 2. Status post 3 cycles of chemotherapy with Revlimid and Decadron followed by 1 cycle of Decadron only with mild response. 3. Status post 2 cycles of systemic chemotherapy with Velcade, Doxil and Decadron discontinued secondary to significant peripheral neuropathy. Last dose was given May 2010 at [facility]. 4. Status post autologous peripheral blood stem cell transplant on October 01, 2008 at [facility] under the care of Dr. Phyllis Ginger. CURRENT THERAPY: Systemic chemotherapy with Carfilzomib initially was 20 mg/M2 and will be increased after cycle #1 to 36 mg/M2 on days 1, 2, 8, 9, 15 and 16 every 4 weeks in addition to Cytoxan 300 mg/M2 and Decadron 40 mg by mouth weekly basis. First cycle started on 12/29/2012.   Anemia -Secondary to above chemotherapy, H./H. stable today  HTN -Start Coreg    Code Status: Full Family Communication: No family available Disposition Plan: Discharge in the a.m. if SOB improved   Consultants:    Procedures:  VQ scan 01/14/2013 No evidence of pulmonary embolus  CXR 01/13/2013 1.  Port-A-Cath in good anatomic position.  2. Borderline cardiomegaly with mild pulmonary vascular prominence.  No overt congestive heart failure  Echocardiogram 12/26/2012 -Left ventricle: The cavity size was normal. Wall thickness was normal.  -LVEF= 60% to 65%.  -(grade 1 diastolic dysfunction).   Antibiotics:    HPI/Subjective: 72 yo WF PMHx  hypothyroid, acute/chronic renal failure, anemia, asthma, chronic diastolic heart failure and multiple myeloma in remission who started having shortness of breath today, with markedly onset with exertion. Rest it was not too bad, but when exerting herself, patient noted significant shortness of breath and also some chest pain on the right-hand side starting mid sternal/epigastric and radiating to the right.  Patient came into the emergency room for evaluation. Chest x-ray was negative. Lung fields were clean. Patient's labs for the most part were within baseline and actually her renal function is better than normal. Patient's hemoglobin however was down to 6.8, when yesterday morning it was 8.7 checked in the office. Patient is reported no episodes of dark black stool or bloody stool this is felt more likely be related to her myeloma and chemotherapy. Patient was ordered 2 units packed red blood cells. Because of concerns for blood clot in patient with history of cancer and description of right-sided chest pain, CT angiogram was ordered, however this was canceled because of patient's renal failure-which is chronic. 01/14/2013 currently patient still having some substernal discomfort, states this is nothing like the pain she was having upon admission. States dressing over port is causing her to have blisters. Also states having DOE. .  Objective: Filed Vitals:   01/13/13 2246 01/14/13 0526 01/14/13 0528 01/14/13 1453  BP:  141/74  148/74  Pulse:  65  74  Temp:  98.1 F (36.7 C)  98.5 F (36.9 C)  TempSrc:  Oral  Oral  Resp:  18  18  Height: 5\' 5"   (1.651 m)     Weight: 85.186 kg (187 lb 12.8 oz)     SpO2:  100% 100% 98%   No intake or output data in the 24 hours ending 01/14/13 1703 Filed Weights   01/13/13 2246  Weight: 85.186 kg (187 lb 12.8 oz)    Exam:   General:  A./O. x4, NAD  Cardiovascular: Regular rhythm and rate, negative murmurs rubs gallops, DP/PT pulse one plus bilateral  Respiratory: Clear to auscultation bilateral  Abdomen: Soft, nontender, nondistended, plus bowel sound  Musculoskeletal: Negative pedal edema, skin underneath port right upper chest wall erythematous/blistering    Data Reviewed: Basic Metabolic Panel:  Recent Labs Lab 01/12/13 0847 01/13/13 1358 01/14/13 0625  NA 141 141 140  K 3.8 4.1 3.3*  CL  --  109 108  CO2 18* 17* 18*  GLUCOSE 124 154* 113*  BUN 27.7* 32* 29*  CREATININE 2.5* 2.50* 2.56*  CALCIUM 8.9 8.2* 7.7*   Liver Function Tests:  Recent Labs Lab 01/12/13 0847  AST 20  ALT 31  ALKPHOS 147  BILITOT 0.63  PROT 6.7  ALBUMIN 3.4*   No results found for this basename: LIPASE, AMYLASE,  in the last 168 hours No results found for this basename: AMMONIA,  in the last 168 hours CBC:  Recent Labs Lab 01/12/13 0844 01/13/13 1358 01/14/13 0625  WBC 5.4 8.7 3.6*  NEUTROABS 4.3 7.7  --   HGB 8.7* 6.8* 9.7*  HCT 26.6* 20.2* 28.8*  MCV 93.7 94.4 92.9  PLT 186 168 119*   Cardiac Enzymes: No results found for this basename: CKTOTAL, CKMB, CKMBINDEX, TROPONINI,  in the last 168 hours BNP (last 3 results)  Recent Labs  01/13/13 1358  PROBNP 3915.0*   CBG: No results found for this basename: GLUCAP,  in the last 168 hours  Recent Results (from the past 240 hour(s))  URINE CULTURE     Status: None   Collection Time    01/06/13  4:13 PM      Result Value Range Status   Urine Culture, Routine Culture, Urine   Final   Comment: ------------------------------------------------------------------------     SOURCE=CCU     Final - ===== COLONY COUNT: =====      NO GROWTH     NO GROWTH     Studies: Dg Chest 2 View  01/13/2013   CLINICAL DATA:  Shortness of breath.  EXAM: CHEST  2 VIEW  COMPARISON:  12/02/2012.  FINDINGS: Port-A-Cath in good anatomic position. Poor inspiration. No definite pulmonary infiltrate. No pleural effusion or pneumothorax. Borderline cardiomegaly. No acute bony abnormality. Surgical clips noted over the anterior chest.  IMPRESSION: 1. Port-A-Cath in good anatomic position. 2. Borderline cardiomegaly with mild pulmonary vascular prominence. No overt congestive heart failure.   Electronically Signed   By: Marcello Moores  Register   On: 01/13/2013 13:43   Nm Pulmonary Perf And Vent  01/14/2013   CLINICAL DATA:  History of myeloma. Acute shortness of breath, right-sided chest pain, chronic renal failure.  EXAM: NUCLEAR MEDICINE VENTILATION - PERFUSION LUNG SCAN  TECHNIQUE: Ventilation images were obtained in multiple projections using inhaled aerosol technetium 99 M DTPA. Perfusion images were obtained in multiple projections after intravenous injection of Tc-6m MAA.  COMPARISON:  Chest x-ray 01/13/2013  RADIOPHARMACEUTICALS:  40 mCi Tc-68m DTPA aerosol and 6 mCi  Tc-26m MAA  FINDINGS: Ventilation: No focal ventilation defect.  Perfusion: No wedge shaped peripheral perfusion defects to suggest acute pulmonary embolism.  IMPRESSION: No evidence of pulmonary embolus.   Electronically Signed   By: Rolm Baptise M.D.   On: 01/14/2013 13:58    Scheduled Meds: . citalopram  40 mg Oral Q breakfast  . cromolyn  1 drop Both Eyes QID  . [START ON 01/19/2013] dexamethasone  4 mg Oral Weekly  . ferrous sulfate  325 mg Oral QODAY  . gabapentin  300 mg Oral QHS  . levothyroxine  150 mcg Oral Custom  . [START ON 01/17/2013] levothyroxine  225 mcg Oral Custom  . loratadine  10 mg Oral Daily  . loteprednol  1 drop Both Eyes TID  . pantoprazole  40 mg Oral BID   Continuous Infusions:   Principal Problem:   Shortness of breath Active Problems:   Multiple  myeloma in remission   Anemia   Sleep disturbance   Asthma, chronic   Hypothyroid   Chronic diastolic heart failure    Time spent: 60 minutes   Jamelah Sitzer, J  [facility] Pager [phone]. If 7PM-7AM, please contact night-coverage at [email or web address], password [facility] 01/14/2013, 5:03 PM  LOS: 1 day

## 2013-01-14 NOTE — Progress Notes (Signed)
UR completed 

## 2013-01-14 NOTE — Progress Notes (Signed)
PRINCIPAL DIAGNOSES:  1. Recurrent multiple myeloma initially diagnosed in 2002, initially with smoldering myeloma at [facility].  2. Ductal carcinoma in situ status post mastectomy with sentinel lymph node biopsy in October 2008. PRIOR THERAPY:  1. Status post treatment with tamoxifen from November 2008 through February 2009, discontinued secondary to intolerance. 2. Status post 3 cycles of chemotherapy with Revlimid and Decadron followed by 1 cycle of Decadron only with mild response. 3. Status post 2 cycles of systemic chemotherapy with Velcade, Doxil and Decadron discontinued secondary to significant peripheral neuropathy. Last dose was given May 2010 at [facility]. 4. Status post autologous peripheral blood stem cell transplant on October 01, 2008 at [facility] under the care of Dr. Phyllis Ginger. CURRENT THERAPY: Systemic chemotherapy with Carfilzomib initially was 20 mg/M2 and will be increased after cycle #1 to 36 mg/M2 on days 1, 2, 8, 9, 15 and 16 every 4 weeks in addition to Cytoxan 300 mg/M2 and Decadron 40 mg by mouth weekly basis. First cycle started on 12/29/2012.   Subjective: The patient is seen and examined today. She was admitted yesterday to [facility] with worsening dyspnea and chest pain. During her evaluation she was found to have a low hemoglobin of 6.8 and hematocrit 20.2%. The patient received 2 units of PRBCs transfusion. She is scheduled for V/Q. Scan to rule out pulmonary embolism later today. She is feeling much better today. She denied having any significant fever or chills. Her chest pain has improved but continues to have shortness breath with exertion.   Objective: Vital signs in last 24 hours: Temp:  [97.7 F (36.5 C)-98.5 F (36.9 C)] 98.1 F (36.7 C) (12/31 0526) Pulse Rate:  [65-77] 65 (12/31 0526) Resp:  [11-20] 18 (12/31 0526) BP: (126-169)/(68-121) 141/74 mmHg (12/31 0526) SpO2:  [93 %-100 %] 100 % (12/31  0528) Weight:  [187 lb 12.8 oz (85.186 kg)] 187 lb 12.8 oz (85.186 kg) (12/30 2246)  Intake/Output from previous day: 12/30 0701 - 12/31 0700 In: 312.5 [Blood:312.5] Out: -  Intake/Output this shift:    General appearance: alert, cooperative, fatigued and no distress Resp: clear to auscultation bilaterally Cardio: regular rate and rhythm, S1, S2 normal, no murmur, click, rub or gallop GI: soft, non-tender; bowel sounds normal; no masses,  no organomegaly Extremities: extremities normal, atraumatic, no cyanosis or edema  Lab Results:   Recent Labs  01/13/13 1358 01/14/13 0625  WBC 8.7 3.6*  HGB 6.8* 9.7*  HCT 20.2* 28.8*  PLT 168 119*   BMET  Recent Labs  01/13/13 1358 01/14/13 0625  NA 141 140  K 4.1 3.3*  CL 109 108  CO2 17* 18*  GLUCOSE 154* 113*  BUN 32* 29*  CREATININE 2.50* 2.56*  CALCIUM 8.2* 7.7*    Studies/Results: Dg Chest 2 View  01/13/2013   CLINICAL DATA:  Shortness of breath.  EXAM: CHEST  2 VIEW  COMPARISON:  12/02/2012.  FINDINGS: Port-A-Cath in good anatomic position. Poor inspiration. No definite pulmonary infiltrate. No pleural effusion or pneumothorax. Borderline cardiomegaly. No acute bony abnormality. Surgical clips noted over the anterior chest.  IMPRESSION: 1. Port-A-Cath in good anatomic position. 2. Borderline cardiomegaly with mild pulmonary vascular prominence. No overt congestive heart failure.   Electronically Signed   By: Marcello Moores  Register   On: 01/13/2013 13:43    Medications: I have reviewed the patient's current medications.   Assessment/Plan: 1) recurrent multiple myeloma: She is currently on systemic chemotherapy with Carfilzomib, dexamethasone and Cytoxan status  post 1 cycle. Her next cycle is as scheduled in 2 weeks. 2) shortness of breath and chest pain: VQ scan performed today showed no evidence for pulmonary embolism. This is most likely secondary to her chemotherapy-induced anemia which is improved with PRBCs  transfusion. 3) chemotherapy-induced anemia: Improved after transfusion. 4) renal insufficiency: Could be secondary to relapsed multiple myeloma. Slowly improving. The patient will follow up with me as previously scheduled in 2 weeks. Thank you so much for taking good care of [name]. Please call if you have any questions.  LOS: 1 day    Nancy Vanhise K. 01/14/2013

## 2013-01-15 DIAGNOSIS — R0602 Shortness of breath: Secondary | ICD-10-CM | POA: Diagnosis not present

## 2013-01-15 DIAGNOSIS — Z79899 Other long term (current) drug therapy: Secondary | ICD-10-CM | POA: Diagnosis not present

## 2013-01-15 DIAGNOSIS — I129 Hypertensive chronic kidney disease with stage 1 through stage 4 chronic kidney disease, or unspecified chronic kidney disease: Secondary | ICD-10-CM | POA: Diagnosis not present

## 2013-01-15 DIAGNOSIS — T451X5A Adverse effect of antineoplastic and immunosuppressive drugs, initial encounter: Secondary | ICD-10-CM | POA: Diagnosis not present

## 2013-01-15 DIAGNOSIS — R0989 Other specified symptoms and signs involving the circulatory and respiratory systems: Secondary | ICD-10-CM | POA: Diagnosis present

## 2013-01-15 DIAGNOSIS — I509 Heart failure, unspecified: Secondary | ICD-10-CM | POA: Diagnosis not present

## 2013-01-15 DIAGNOSIS — N189 Chronic kidney disease, unspecified: Secondary | ICD-10-CM | POA: Diagnosis not present

## 2013-01-15 DIAGNOSIS — R0609 Other forms of dyspnea: Secondary | ICD-10-CM | POA: Diagnosis present

## 2013-01-15 DIAGNOSIS — I5030 Unspecified diastolic (congestive) heart failure: Secondary | ICD-10-CM | POA: Diagnosis not present

## 2013-01-15 DIAGNOSIS — Z9221 Personal history of antineoplastic chemotherapy: Secondary | ICD-10-CM | POA: Diagnosis not present

## 2013-01-15 DIAGNOSIS — D6481 Anemia due to antineoplastic chemotherapy: Secondary | ICD-10-CM | POA: Diagnosis not present

## 2013-01-15 DIAGNOSIS — I10 Essential (primary) hypertension: Secondary | ICD-10-CM

## 2013-01-15 DIAGNOSIS — C9000 Multiple myeloma not having achieved remission: Secondary | ICD-10-CM | POA: Diagnosis not present

## 2013-01-15 DIAGNOSIS — G479 Sleep disorder, unspecified: Secondary | ICD-10-CM | POA: Diagnosis not present

## 2013-01-15 DIAGNOSIS — E039 Hypothyroidism, unspecified: Secondary | ICD-10-CM | POA: Diagnosis not present

## 2013-01-15 DIAGNOSIS — D0590 Unspecified type of carcinoma in situ of unspecified breast: Secondary | ICD-10-CM | POA: Diagnosis not present

## 2013-01-15 DIAGNOSIS — J449 Chronic obstructive pulmonary disease, unspecified: Secondary | ICD-10-CM | POA: Diagnosis not present

## 2013-01-15 DIAGNOSIS — N179 Acute kidney failure, unspecified: Secondary | ICD-10-CM | POA: Diagnosis not present

## 2013-01-15 DIAGNOSIS — Z9484 Stem cells transplant status: Secondary | ICD-10-CM | POA: Diagnosis not present

## 2013-01-15 MED ORDER — FERROUS SULFATE 325 (65 FE) MG PO TABS: 325.0000 mg | ORAL_TABLET | ORAL | Status: DC

## 2013-01-15 MED ORDER — CARVEDILOL 3.125 MG PO TABS: 3.1250 mg | ORAL_TABLET | Freq: Two times a day (BID) | ORAL | Status: DC

## 2013-01-15 MED ORDER — IPRATROPIUM-ALBUTEROL 0.5-2.5 (3) MG/3ML IN SOLN
3.0000 mL | Freq: Three times a day (TID) | RESPIRATORY_TRACT | Status: DC
  Filled 2013-01-15: qty 3

## 2013-01-15 NOTE — Progress Notes (Signed)
Pt ambulated in hall with assistance, O2 sat between 90 and 92. Pt denies chest pain. Will cont to monitor.

## 2013-01-15 NOTE — Discharge Summary (Signed)
Physician Discharge Summary  Nancy Norris YBO:[id number] DOB: October 21, 1940 DOA: 01/13/2013  PCP: [name], MD  Admit date: 01/13/2013 Discharge date: 01/15/2013  Time spent:46minutes  Recommendations for Outpatient Follow-up: Diastolic CHF  -Although patient's CXR did not support fluid overload patient still experiencing some SOB, it has gotten a large fluid load with PRBC x2 units and IV fluids overnight. Treated Lasix 40 mg x1  -ProBNP= 3915 upon admission  -Patient on low-dose Coreg, and will continue low-dose Lasix on discharge -Patient will need to followup with PCP to recheck renal function, and need for titration of Coreg/Lasix  Asthma  -Will restart patient's home medication regimen even though patient has never had spirometry.  -Will refer patient to [place] [facility] [facility] to obtain spirometry pre-/post bronchodilator, DLCO -Ambulatory SpO2 on room air O2 sat between 90 and 92.   SOB  -See asthma   Hypothyroid  -Continue home medication   Multiple myeloma  Per Dr. Inda Merlin (oncology) patient received below chemotherapy treatment  PRIOR THERAPY:  1. Status post treatment with tamoxifen from November 2008 through February 2009, discontinued secondary to intolerance. 2. Status post 3 cycles of chemotherapy with Revlimid and Decadron followed by 1 cycle of Decadron only with mild response. 3. Status post 2 cycles of systemic chemotherapy with Velcade, Doxil and Decadron discontinued secondary to significant peripheral neuropathy. Last dose was given May 2010 at [facility]. 4. Status post autologous peripheral blood stem cell transplant on October 01, 2008 at [facility] under the care of Dr. Phyllis Ginger. CURRENT THERAPY: Systemic chemotherapy with Carfilzomib initially was 20 mg/M2 and will be increased after cycle #1 to 36 mg/M2 on days 1, 2, 8, 9, 15 and 16 every 4 weeks in addition to Cytoxan 300 mg/M2 and Decadron 40 mg by mouth weekly basis. First  cycle started on 12/29/2012.  -Patient to followup with Dr. Inda Merlin (oncologist) next week for second cycle of chemotherapy  Anemia  -Secondary to above chemotherapy, H./H. stable today   HTN  -Continue  Coreg  Acute /chronic renal failure -Patient will need to continue to monitor her renal function with her PCP to ensure does not worsen secondary to chemotherapy or other medications    Discharge Diagnoses:  Principal Problem:   Shortness of breath Active Problems:   Multiple myeloma in remission   Acute on chronic renal failure   Anemia   Sleep disturbance   Asthma, chronic   Hypothyroid   Chronic diastolic heart failure   HTN (hypertension)   Discharge Condition: Stable  Diet recommendation: Heart Healthy  Filed Weights   01/13/13 2246  Weight: 85.186 kg (187 lb 12.8 oz)    History of present illness:  73 yo WF PMHx hypothyroid, acute/chronic renal failure, anemia, asthma, chronic diastolic heart failure and multiple myeloma in remission who started having shortness of breath today, with markedly onset with exertion. Rest it was not too bad, but when exerting herself, patient noted significant shortness of breath and also some chest pain on the right-hand side starting mid sternal/epigastric and radiating to the right.  Patient came into the emergency room for evaluation. Chest x-ray was negative. Lung fields were clean. Patient's labs for the most part were within baseline and actually her renal function is better than normal. Patient's hemoglobin however was down to 6.8, when yesterday morning it was 8.7 checked in the office. Patient is reported no episodes of dark black stool or bloody stool this is felt more likely be related to her myeloma and chemotherapy.  Patient was ordered 2 units packed red blood cells. Because of concerns for blood clot in patient with history of cancer and description of right-sided chest pain, CT angiogram was ordered, however this was canceled  because of patient's renal failure-which is chronic. 01/14/2013 currently patient still having some substernal discomfort, states this is nothing like the pain she was having upon admission. States dressing over port is causing her to have blisters. Also states having DOE. 01/15/2013 patient states was diagnosed with COPD and started on medication in 2006. Never had spirometry performed prior to starting medication. States today feels greatly improved and ready for discharge, however does admit to some DOE. Patient's amateur SpO2 show that patient did desat but does not meet criteria for home O2.   Procedures:  VQ scan 01/14/2013  No evidence of pulmonary embolus   CXR 01/13/2013  1. Port-A-Cath in good anatomic position.  2. Borderline cardiomegaly with mild pulmonary vascular prominence.  No overt congestive heart failure   Echocardiogram 12/26/2012  -Left ventricle: The cavity size was normal. Wall thickness was normal.  -LVEF= 60% to 65%.  -(grade 1 diastolic dysfunction).      Consultations:    Discharge Exam: Filed Vitals:   01/14/13 0528 01/14/13 1453 01/14/13 2300 01/15/13 0450  BP:  148/74 123/65 129/70  Pulse:  74 70 77  Temp:  98.5 F (36.9 C) 98.7 F (37.1 C) 98.2 F (36.8 C)  TempSrc:  Oral Oral Oral  Resp:  $Remo'18 16 18  'zjaEZ$ Height:      Weight:      SpO2: 100% 98% 96% 98%   General: A./O. x4, NAD  Cardiovascular: Regular rhythm and rate, negative murmurs rubs gallops, DP/PT pulse one plus bilateral  Respiratory: Clear to auscultation bilateral  Abdomen: Soft, nontender, nondistended, plus bowel sound     Discharge Instructions   Future Appointments Provider Department Dept Phone   01/27/2013 8:45 AM [facility] [place] [phone]   01/27/2013 9:15 AM Adrena E Johnson, [place] [phone]   01/27/2013 10:00 AM [facility] [place] [phone]    01/28/2013 9:30 AM [facility] [place] [phone]   02/03/2013 9:00 AM [facility] Only [place] [phone]   02/03/2013 9:30 AM [facility] [place] [phone]   02/04/2013 10:30 AM [facility] [place] [phone]   02/10/2013 9:15 AM [facility] [place] [phone]   02/10/2013 9:45 AM [facility] [place] [phone]   02/11/2013 9:00 AM [facility] [place] [phone]   02/24/2013 3:00 PM [name], MD [place] [facility] [phone]   07/08/2013 10:00 AM Ardis Hughs, PA-C [employer] [facility] [phone]       Medication List    ASK your doctor about these medications       acetaminophen 325 MG tablet  Commonly known as:  TYLENOL  Take 650 mg by mouth every 6 (six) hours as needed for mild pain or moderate pain.     albuterol 108 (90 BASE) MCG/ACT inhaler  Commonly known as:  PROVENTIL HFA;VENTOLIN HFA  Inhale 2 puffs into the lungs 2 (two) times daily as needed for wheezing or shortness of breath.     ascorbic acid 250 MG Chew  Commonly known as:  VITAMIN C  Chew 250 mg by mouth daily.     cetirizine 10 MG tablet  Commonly known as:  ZYRTEC  Take 10 mg by mouth at bedtime.     citalopram 40 MG tablet  Commonly known as:  CELEXA  Take 40 mg by mouth daily with breakfast.     Cranberry 300 MG tablet  Take 300 mg by mouth daily.     cromolyn 4 % ophthalmic solution  Commonly known as:  OPTICROM  Place 1 drop into both eyes 4 (four) times daily.     cyanocobalamin 2000 MCG tablet  Take 2,000 mcg by mouth daily.     dexamethasone 4 MG tablet  Commonly known as:  DECADRON  10 tablets by mouth on a weekly basis starting with the first dose  of chemotherapy.     Ferrous Sulfate 134 MG Tabs  Take 1 tablet by mouth every other day.     gabapentin 300 MG capsule  Commonly known as:  NEURONTIN  Take 300 mg by mouth at bedtime.     HAIR/SKIN/NAILS PO  Take 1 tablet by mouth daily.     levothyroxine 150 MCG tablet  Commonly known as:  SYNTHROID, LEVOTHROID  Take 150-225 mcg by mouth daily. Takes 1 tablet daily except Saturday and Sunday she takes 1 and 1/2     lidocaine-prilocaine cream  Commonly known as:  EMLA  Apply 1 application topically as needed. Apply to port 1 hour before chemotherapy appt.     loteprednol 0.2 % Susp  Commonly known as:  LOTEMAX  Place 1 drop into both eyes 3 (three) times daily.     magic mouthwash w/lidocaine Soln  Take 5 mLs by mouth 4 (four) times daily as needed for mouth pain (swish and spit QID PRN).     pantoprazole 40 MG tablet  Commonly known as:  PROTONIX  Take 40 mg by mouth 2 (two) times daily.     prochlorperazine 10 MG tablet  Commonly known as:  COMPAZINE  Take 1 tablet (10 mg total) by mouth every 6 (six) hours as needed for nausea or vomiting.     Vitamin D3 10000 UNITS capsule  Take 10,000 Units by mouth every other day.       Allergies  Allergen Reactions  . Codeine Anaphylaxis  . Latex Shortness Of Breath    Adhesive products   . Iodinated Diagnostic Agents Itching    Happened 40 years ago  . Other     Onion, chocolate causes headache  . Sulfa Antibiotics Itching  . Adhesive [Tape]     blisters      The results of significant diagnostics from this hospitalization (including imaging, microbiology, ancillary and laboratory) are listed below for reference.    Significant Diagnostic Studies: Dg Chest 2 View  01/13/2013   CLINICAL DATA:  Shortness of breath.  EXAM: CHEST  2 VIEW  COMPARISON:  12/02/2012.  FINDINGS: Port-A-Cath in good anatomic position. Poor inspiration. No definite pulmonary infiltrate. No pleural effusion or pneumothorax. Borderline  cardiomegaly. No acute bony abnormality. Surgical clips noted over the anterior chest.  IMPRESSION: 1. Port-A-Cath in good anatomic position. 2. Borderline cardiomegaly with mild pulmonary vascular prominence. No overt congestive heart failure.   Electronically Signed   By: Marcello Moores  Register   On: 01/13/2013 13:43   Nm Pulmonary Perf And Vent  01/14/2013   CLINICAL DATA:  History of myeloma. Acute shortness of breath, right-sided chest pain, chronic renal failure.  EXAM: NUCLEAR  MEDICINE VENTILATION - PERFUSION LUNG SCAN  TECHNIQUE: Ventilation images were obtained in multiple projections using inhaled aerosol technetium 99 M DTPA. Perfusion images were obtained in multiple projections after intravenous injection of Tc-19m MAA.  COMPARISON:  Chest x-ray 01/13/2013  RADIOPHARMACEUTICALS:  40 mCi Tc-52m DTPA aerosol and 6 mCi Tc-60m MAA  FINDINGS: Ventilation: No focal ventilation defect.  Perfusion: No wedge shaped peripheral perfusion defects to suggest acute pulmonary embolism.  IMPRESSION: No evidence of pulmonary embolus.   Electronically Signed   By: Rolm Baptise M.D.   On: 01/14/2013 13:58   Ir Fluoro Guide Cv Line Right  01/01/2013   INDICATION: History of multiple myeloma, in need of intravenous access for chemotherapy administration  EXAM: IMPLANTED PORT A CATH PLACEMENT WITH ULTRASOUND AND FLUOROSCOPIC GUIDANCE  MEDICATIONS: Ancef 2 gm IV; IV antibiotic was given in an appropriate time interval prior to skin puncture.  ANESTHESIA/SEDATION: Versed 3 mg IV; Fentanyl 100 mcg IV;  Total Moderate Sedation Time  30  minutes.  CONTRAST:  None  COMPARISON:  None.  FLUOROSCOPY TIME:  30 seconds  PROCEDURE: The procedure, risks, benefits, and alternatives were explained to the patient. Questions regarding the procedure were encouraged and answered. The patient understands and consents to the procedure.  The right neck and chest were prepped with chlorhexidine in a sterile fashion, and a sterile drape was  applied covering the operative field. Maximum barrier sterile technique with sterile gowns and gloves were used for the procedure. A timeout was performed prior to the initiation of the procedure. Local anesthesia was provided with 1% lidocaine with epinephrine.  After creating a small venotomy incision, a micropuncture kit was utilized to access the internal jugular vein under direct, real-time ultrasound guidance. Ultrasound image documentation was performed. The microwire was kinked to measure appropriate catheter length.  A subcutaneous port pocket was then created along the upper chest wall (at the site of the previous right anterior chest wall port a catheter) utilizing a combination of sharp and blunt dissection. The pocket was irrigated with sterile saline. A single lumen ISP power injectable port was chosen for placement. The 8 Fr catheter was tunneled from the port pocket site to the venotomy incision. The port was placed in the pocket. The external catheter was trimmed to appropriate length. At the venotomy, an 8 Fr peel-away sheath was placed over a guidewire under fluoroscopic guidance. The catheter was then placed through the sheath and the sheath was removed. Final catheter positioning was confirmed and documented with a fluoroscopic spot radiograph. The port was accessed with a Huber needle, aspirated and flushed with heparinized saline.  The venotomy site was closed with an interrupted 4-0 Vicryl suture. The port pocket incision was closed with interrupted 2-0 Vicryl suture and the skin was opposed with a running subcuticular 4-0 Vicryl suture. Dermabond and Steri-strips were applied to both incisions. Dressings were placed. The patient tolerated the procedure well without immediate post procedural complication.  COMPLICATIONS: None immediate  FINDINGS: After catheter placement, the tip lies within the superior cavoatrial junction. The catheter aspirates and flushes normally and is ready for  immediate use.  IMPRESSION: Successful placement of a right internal jugular approach power injectable Port-A-Cath. The catheter is ready for immediate use.   Electronically Signed   By: Sandi Mariscal M.D.   On: 01/01/2013 16:35   Ir US Guide Vasc Access Right  01/01/2013   INDICATION: History of multiple myeloma, in need of intravenous access for chemotherapy administration  EXAM: IMPLANTED PORT  A CATH PLACEMENT WITH ULTRASOUND AND FLUOROSCOPIC GUIDANCE  MEDICATIONS: Ancef 2 gm IV; IV antibiotic was given in an appropriate time interval prior to skin puncture.  ANESTHESIA/SEDATION: Versed 3 mg IV; Fentanyl 100 mcg IV;  Total Moderate Sedation Time  30  minutes.  CONTRAST:  None  COMPARISON:  None.  FLUOROSCOPY TIME:  30 seconds  PROCEDURE: The procedure, risks, benefits, and alternatives were explained to the patient. Questions regarding the procedure were encouraged and answered. The patient understands and consents to the procedure.  The right neck and chest were prepped with chlorhexidine in a sterile fashion, and a sterile drape was applied covering the operative field. Maximum barrier sterile technique with sterile gowns and gloves were used for the procedure. A timeout was performed prior to the initiation of the procedure. Local anesthesia was provided with 1% lidocaine with epinephrine.  After creating a small venotomy incision, a micropuncture kit was utilized to access the internal jugular vein under direct, real-time ultrasound guidance. Ultrasound image documentation was performed. The microwire was kinked to measure appropriate catheter length.  A subcutaneous port pocket was then created along the upper chest wall (at the site of the previous right anterior chest wall port a catheter) utilizing a combination of sharp and blunt dissection. The pocket was irrigated with sterile saline. A single lumen ISP power injectable port was chosen for placement. The 8 Fr catheter was tunneled from the port  pocket site to the venotomy incision. The port was placed in the pocket. The external catheter was trimmed to appropriate length. At the venotomy, an 8 Fr peel-away sheath was placed over a guidewire under fluoroscopic guidance. The catheter was then placed through the sheath and the sheath was removed. Final catheter positioning was confirmed and documented with a fluoroscopic spot radiograph. The port was accessed with a Huber needle, aspirated and flushed with heparinized saline.  The venotomy site was closed with an interrupted 4-0 Vicryl suture. The port pocket incision was closed with interrupted 2-0 Vicryl suture and the skin was opposed with a running subcuticular 4-0 Vicryl suture. Dermabond and Steri-strips were applied to both incisions. Dressings were placed. The patient tolerated the procedure well without immediate post procedural complication.  COMPLICATIONS: None immediate  FINDINGS: After catheter placement, the tip lies within the superior cavoatrial junction. The catheter aspirates and flushes normally and is ready for immediate use.  IMPRESSION: Successful placement of a right internal jugular approach power injectable Port-A-Cath. The catheter is ready for immediate use.   Electronically Signed   By: Sandi Mariscal M.D.   On: 01/01/2013 16:35   Ct Biopsy  12/17/2012   INDICATION: Multiple myeloma  EXAM: CT BIOPSY  MEDICATIONS: Fentanyl 75 mcg IV; Versed 1.5 mg IV  ANESTHESIA/SEDATION: Sedation Time  10 minutes  CONTRAST:  None  PROCEDURE: Informed consent was obtained from the patient following an explanation of the procedure, risks, benefits and alternatives. The patient understands, agrees and consents for the procedure. All questions were addressed. A time out was performed prior to the initiation of the procedure. The patient was positioned prone and noncontrast localization CT was performed of the pelvis to demonstrate the iliac marrow spaces. The operative site was prepped and draped in  the usual sterile fashion.  Under sterile conditions and local anesthesia, an 11 gauge coaxial bone biopsy needle was advanced into the left iliac marrow space. Needle position was confirmed with CT imaging. Initially, bone marrow aspiration was performed. Next, a bone marrow biopsy was obtained with  the 11 gauge outer bone marrow device. Samples were prepared with the cytotechnologist and deemed adequate. The needle was removed intact. Hemostasis was obtained with compression and a dressing was placed. The patient tolerated the procedure well without immediate post procedural complication.  COMPLICATIONS: None immediate.  IMPRESSION: Successful CT guided left iliac bone marrow aspiration and core biopsies.   Electronically Signed   By: Sandi Mariscal M.D.   On: 12/17/2012 12:26    Microbiology: Recent Results (from the past 240 hour(s))  URINE CULTURE     Status: None   Collection Time    01/06/13  4:13 PM      Result Value Range Status   Urine Culture, Routine Culture, Urine   Final   Comment: ------------------------------------------------------------------------     SOURCE=CCU     Final - ===== COLONY COUNT: =====     NO GROWTH     NO GROWTH     Labs: Basic Metabolic Panel:  Recent Labs Lab 01/12/13 0847 01/13/13 1358 01/14/13 0625  NA 141 141 140  K 3.8 4.1 3.3*  CL  --  109 108  CO2 18* 17* 18*  GLUCOSE 124 154* 113*  BUN 27.7* 32* 29*  CREATININE 2.5* 2.50* 2.56*  CALCIUM 8.9 8.2* 7.7*   Liver Function Tests:  Recent Labs Lab 01/12/13 0847  AST 20  ALT 31  ALKPHOS 147  BILITOT 0.63  PROT 6.7  ALBUMIN 3.4*   No results found for this basename: LIPASE, AMYLASE,  in the last 168 hours No results found for this basename: AMMONIA,  in the last 168 hours CBC:  Recent Labs Lab 01/12/13 0844 01/13/13 1358 01/14/13 0625  WBC 5.4 8.7 3.6*  NEUTROABS 4.3 7.7  --   HGB 8.7* 6.8* 9.7*  HCT 26.6* 20.2* 28.8*  MCV 93.7 94.4 92.9  PLT 186 168 119*   Cardiac  Enzymes: No results found for this basename: CKTOTAL, CKMB, CKMBINDEX, TROPONINI,  in the last 168 hours BNP: BNP (last 3 results)  Recent Labs  01/13/13 1358  PROBNP 3915.0*   CBG: No results found for this basename: GLUCAP,  in the last 168 hours     Signed:  Dia Crawford, MD [facility] [phone] pager

## 2013-01-19 ENCOUNTER — Other Ambulatory Visit: Payer: Medicare Other

## 2013-01-19 ENCOUNTER — Telehealth: Payer: Self-pay | Admitting: Internal Medicine

## 2013-01-19 NOTE — Telephone Encounter (Signed)
Talked to pt and gave her appt for January 2015 lab,md and chemo

## 2013-01-20 ENCOUNTER — Encounter: Payer: Self-pay | Admitting: Internal Medicine

## 2013-01-20 ENCOUNTER — Ambulatory Visit (INDEPENDENT_AMBULATORY_CARE_PROVIDER_SITE_OTHER): Payer: Medicare Other | Admitting: Internal Medicine

## 2013-01-20 VITALS — BP 120/74 | HR 63 | Ht 65.5 in | Wt 186.4 lb

## 2013-01-20 DIAGNOSIS — R0989 Other specified symptoms and signs involving the circulatory and respiratory systems: Secondary | ICD-10-CM

## 2013-01-20 DIAGNOSIS — R0609 Other forms of dyspnea: Secondary | ICD-10-CM

## 2013-01-20 DIAGNOSIS — R0602 Shortness of breath: Secondary | ICD-10-CM

## 2013-01-20 DIAGNOSIS — R079 Chest pain, unspecified: Secondary | ICD-10-CM

## 2013-01-20 DIAGNOSIS — K219 Gastro-esophageal reflux disease without esophagitis: Secondary | ICD-10-CM

## 2013-01-20 NOTE — Progress Notes (Signed)
01/20/13- 89 yoF former smoker (30 pk yrs) referred courtesy of Dr Julien Nordmann for dyspnea, asking PFT incl DLCO. Hx Multiple Myeloma in relapse, recent acute hosp summarized below. She quit smoking in 2007 and had been diagnosed with COPD without formal testing. She has a nebulizer machine at home but has not used it in a long time. Abrupt onset of shortness of breath "nothing goes in" as she was walking away from a chemotherapy treatment on December 30.had shortness of breath and midsternal chest pain. Used rescue inhaler with no effect. Now has chest pain on exertion "like spasm" indicating midsternal level of her chest with radiation to angles of the jaw. Self-limited pain fades in a few minutes without palpitation or sweat. Having this pain daily. Associates with effort by climbing stairs or stocking a pantry where she has to raise her arms. No routine cough or wheeze. No history of heart disease. Significant history of reflux for which she takes proton X. Twice daily. Cramping spasms in right hand. Calf pain left greater than right, described as cramps. Doppler leg veins negative 01/13/2013 2D Echo 12/26/12 Study Conclusions Left ventricle: The cavity size was normal. Wall thickness was normal. Systolic function was normal. The estimated ejection fraction was in the range of 60% to 65%. Doppler parameters are consistent with abnormal left ventricular relaxation (grade 1 diastolic dysfunction). Transthoracic echocardiography. M-mode, complete 2D, spectral Doppler, and color Doppler. Height: Height: 165.1cm. Height: 65in. Weight: Weight: 81.2kg. Weight: 178.6lb. Body mass index: BMI: 29.8kg/m^2. Body surface area: BSA: 1.30m^2. Blood pressure: 131/67. Patient status: Outpatient. Location: Echo laboratory. CXR 01/13/13 IMPRESSION:  1. Port-A-Cath in good anatomic position.  2. Borderline cardiomegaly with mild pulmonary vascular prominence.  No overt congestive heart failure.  Electronically  Signed  By: Marcello Moores Register  On: 01/13/2013 13:43 V/Q scan 01/13/13 IMPRESSION:  No evidence of pulmonary embolus.  Electronically Signed  By: Rolm Baptise M.D.  On: 01/14/2013 13:58 Recent Hosp: Admit date: 01/13/2013  Discharge date: 01/15/2013  Time spent:33minutes  Recommendations for Outpatient Follow-up:  Diastolic CHF  -Although patient's CXR did not support fluid overload patient still experiencing some SOB, it has gotten a large fluid load with PRBC x2 units and IV fluids overnight. Treated Lasix 40 mg x1  -ProBNP= 3915 upon admission  -Patient on low-dose Coreg, and will continue low-dose Lasix on discharge  -Patient will need to followup with PCP to recheck renal function, and need for titration of Coreg/Lasix  Asthma  -Will restart patient's home medication regimen even though patient has never had spirometry.  -Will refer patient to [place] [facility] to obtain spirometry pre-/post bronchodilator, DLCO * -Ambulatory SpO2 on room air O2 sat between 90 and 92.  SOB  -See asthma  Hypothyroid  -Continue home medication  Multiple myeloma  Per Dr. Inda Merlin (oncology) patient received below chemotherapy treatment  PRIOR THERAPY:  1. Status post treatment with tamoxifen from November 2008 through February 2009, discontinued secondary to intolerance. 2. Status post 3 cycles of chemotherapy with Revlimid and Decadron followed by 1 cycle of Decadron only with mild response. 3. Status post 2 cycles of systemic chemotherapy with Velcade, Doxil and Decadron discontinued secondary to significant peripheral neuropathy. Last dose was given May 2010 at [facility]. 4. Status post autologous peripheral blood stem cell transplant on October 01, 2008 at [facility] under the care of Dr. Phyllis Ginger. CURRENT THERAPY: Systemic chemotherapy with Carfilzomib initially was 20 mg/M2 and will be increased after cycle #1 to 36 mg/M2 on days 1,  2, 8, 9, 15 and 16 every 4 weeks in  addition to Cytoxan 300 mg/M2 and Decadron 40 mg by mouth weekly basis. First cycle started on 12/29/2012.  -Patient to followup with Dr. Inda Merlin (oncologist) next week for second cycle of chemotherapy  Anemia  -Secondary to above chemotherapy, H./H. stable today  HTN  -Continue Coreg  Acute /chronic renal failure  -Patient will need to continue to monitor her renal function with her PCP to ensure does not worsen secondary to chemotherapy or other medications

## 2013-01-20 NOTE — Patient Instructions (Addendum)
Order- refer to Cardiology for dx exertional chest pain. She has seen Dr [name] in the past.   Carry some Tums and take one as needed for reflux/ heart burn  Take one baby aspirin/ 81 mg daily until you see the cardiologist.  Continue taking protonix twice daily  Order- schedule PFT   Dx dyspnea with exertion.

## 2013-01-20 NOTE — Assessment & Plan Note (Signed)
Exertional chest pain with radiation to the angles of the jaw. Need to exclude angina although it may be esophageal says she is known to have GERD. Plan-add one baby aspirin daily. Carry Tums. Appointment with cardiology for evaluation as possibly angina.

## 2013-01-20 NOTE — Assessment & Plan Note (Signed)
She is already using proton X. Twice daily. We discussed this. Plan-supplement with TUMS as needed

## 2013-01-20 NOTE — Assessment & Plan Note (Signed)
Unusual history of acute onset short after chemotherapy. She was not exposed to cold air. Subsequent workup was negative for acute process. Plan-schedule PFT including diffusion capacity as requested by Dr. Julien Nordmann.

## 2013-01-22 ENCOUNTER — Other Ambulatory Visit: Payer: Medicare Other

## 2013-01-22 DIAGNOSIS — N184 Chronic kidney disease, stage 4 (severe): Secondary | ICD-10-CM | POA: Diagnosis not present

## 2013-01-22 DIAGNOSIS — N179 Acute kidney failure, unspecified: Secondary | ICD-10-CM | POA: Diagnosis not present

## 2013-01-22 DIAGNOSIS — R809 Proteinuria, unspecified: Secondary | ICD-10-CM | POA: Diagnosis not present

## 2013-01-22 DIAGNOSIS — D630 Anemia in neoplastic disease: Secondary | ICD-10-CM | POA: Diagnosis not present

## 2013-01-27 ENCOUNTER — Telehealth: Payer: Self-pay | Admitting: Internal Medicine

## 2013-01-27 ENCOUNTER — Ambulatory Visit (HOSPITAL_BASED_OUTPATIENT_CLINIC_OR_DEPARTMENT_OTHER): Payer: Medicare Other | Admitting: Physician Assistant

## 2013-01-27 ENCOUNTER — Other Ambulatory Visit (HOSPITAL_BASED_OUTPATIENT_CLINIC_OR_DEPARTMENT_OTHER): Payer: Medicare Other

## 2013-01-27 ENCOUNTER — Other Ambulatory Visit: Payer: No Typology Code available for payment source

## 2013-01-27 ENCOUNTER — Encounter: Payer: Self-pay | Admitting: Internal Medicine

## 2013-01-27 ENCOUNTER — Encounter: Payer: Self-pay | Admitting: Physician Assistant

## 2013-01-27 ENCOUNTER — Telehealth: Payer: Self-pay | Admitting: *Deleted

## 2013-01-27 ENCOUNTER — Ambulatory Visit (HOSPITAL_BASED_OUTPATIENT_CLINIC_OR_DEPARTMENT_OTHER): Payer: Medicare Other

## 2013-01-27 VITALS — BP 113/67 | HR 73 | Temp 98.6°F | Ht 65.5 in | Wt 183.7 lb

## 2013-01-27 DIAGNOSIS — Z5111 Encounter for antineoplastic chemotherapy: Secondary | ICD-10-CM

## 2013-01-27 DIAGNOSIS — Z5112 Encounter for antineoplastic immunotherapy: Secondary | ICD-10-CM

## 2013-01-27 DIAGNOSIS — C9002 Multiple myeloma in relapse: Secondary | ICD-10-CM

## 2013-01-27 LAB — COMPREHENSIVE METABOLIC PANEL (CC13)
ALBUMIN: 3.6 g/dL (ref 3.5–5.0)
ALT: 25 U/L (ref 0–55)
AST: 22 U/L (ref 5–34)
Alkaline Phosphatase: 184 U/L — ABNORMAL HIGH (ref 40–150)
Anion Gap: 11 mEq/L (ref 3–11)
BUN: 33.7 mg/dL — AB (ref 7.0–26.0)
CALCIUM: 10 mg/dL (ref 8.4–10.4)
CHLORIDE: 109 meq/L (ref 98–109)
CO2: 21 mEq/L — ABNORMAL LOW (ref 22–29)
Creatinine: 2.8 mg/dL — ABNORMAL HIGH (ref 0.6–1.1)
GLUCOSE: 110 mg/dL (ref 70–140)
POTASSIUM: 3.9 meq/L (ref 3.5–5.1)
SODIUM: 141 meq/L (ref 136–145)
TOTAL PROTEIN: 6.7 g/dL (ref 6.4–8.3)
Total Bilirubin: 0.78 mg/dL (ref 0.20–1.20)

## 2013-01-27 LAB — CBC WITH DIFFERENTIAL/PLATELET
BASO%: 1.1 % (ref 0.0–2.0)
BASOS ABS: 0.1 10*3/uL (ref 0.0–0.1)
EOS%: 3.3 % (ref 0.0–7.0)
Eosinophils Absolute: 0.2 10*3/uL (ref 0.0–0.5)
HCT: 36.9 % (ref 34.8–46.6)
HEMOGLOBIN: 11.9 g/dL (ref 11.6–15.9)
LYMPH#: 1.6 10*3/uL (ref 0.9–3.3)
LYMPH%: 26.1 % (ref 14.0–49.7)
MCH: 30.5 pg (ref 25.1–34.0)
MCHC: 32.2 g/dL (ref 31.5–36.0)
MCV: 94.6 fL (ref 79.5–101.0)
MONO#: 0.8 10*3/uL (ref 0.1–0.9)
MONO%: 13.1 % (ref 0.0–14.0)
NEUT#: 3.5 10*3/uL (ref 1.5–6.5)
NEUT%: 56.4 % (ref 38.4–76.8)
Platelets: 233 10*3/uL (ref 145–400)
RBC: 3.9 10*6/uL (ref 3.70–5.45)
RDW: 14.3 % (ref 11.2–14.5)
WBC: 6.1 10*3/uL (ref 3.9–10.3)
nRBC: 0 % (ref 0–0)

## 2013-01-27 LAB — TECHNOLOGIST REVIEW

## 2013-01-27 MED ORDER — ONDANSETRON 8 MG/50ML IVPB (CHCC)
8.0000 mg | Freq: Once | INTRAVENOUS | Status: AC
  Administered 2013-01-27: 8 mg via INTRAVENOUS

## 2013-01-27 MED ORDER — CARFILZOMIB CHEMO INJECTION 60 MG
36.0000 mg/m2 | Freq: Once | INTRAVENOUS | Status: AC
  Administered 2013-01-27: 70 mg via INTRAVENOUS
  Filled 2013-01-27: qty 35

## 2013-01-27 MED ORDER — SODIUM CHLORIDE 0.9 % IV SOLN: Freq: Once | INTRAVENOUS | Status: DC

## 2013-01-27 MED ORDER — SODIUM CHLORIDE 0.9 % IJ SOLN
10.0000 mL | INTRAMUSCULAR | Status: DC | PRN
  Administered 2013-01-27: 10 mL
  Filled 2013-01-27: qty 10

## 2013-01-27 MED ORDER — DEXAMETHASONE 4 MG PO TABS
40.0000 mg | ORAL_TABLET | Freq: Once | ORAL | Status: DC
  Administered 2013-01-27: 40 mg via ORAL

## 2013-01-27 MED ORDER — ZOLPIDEM TARTRATE 5 MG PO TABS: 5.0000 mg | ORAL_TABLET | Freq: Every evening | ORAL | Status: DC | PRN

## 2013-01-27 MED ORDER — HEPARIN SOD (PORK) LOCK FLUSH 100 UNIT/ML IV SOLN
500.0000 [IU] | Freq: Once | INTRAVENOUS | Status: AC | PRN
  Administered 2013-01-27: 500 [IU]
  Filled 2013-01-27: qty 5

## 2013-01-27 MED ORDER — SODIUM CHLORIDE 0.9 % IV SOLN
Freq: Once | INTRAVENOUS | Status: AC
  Administered 2013-01-27: 11:00:00 via INTRAVENOUS

## 2013-01-27 MED ORDER — ONDANSETRON 8 MG/NS 50 ML IVPB
INTRAVENOUS | Status: AC
  Filled 2013-01-27: qty 8

## 2013-01-27 MED ORDER — PROCHLORPERAZINE MALEATE 10 MG PO TABS
ORAL_TABLET | ORAL | Status: AC
Start: 2013-01-27 — End: 2013-01-27
  Filled 2013-01-27: qty 1

## 2013-01-27 MED ORDER — DEXAMETHASONE SODIUM PHOSPHATE 10 MG/ML IJ SOLN
10.0000 mg | Freq: Once | INTRAMUSCULAR | Status: AC
  Administered 2013-01-27: 10 mg via INTRAVENOUS

## 2013-01-27 MED ORDER — DEXAMETHASONE 4 MG PO TABS
ORAL_TABLET | ORAL | Status: AC
  Filled 2013-01-27: qty 10

## 2013-01-27 MED ORDER — ONDANSETRON 8 MG PO TBDP: 8.0000 mg | ORAL_TABLET | Freq: Three times a day (TID) | ORAL | Status: DC | PRN

## 2013-01-27 MED ORDER — DEXAMETHASONE SODIUM PHOSPHATE 10 MG/ML IJ SOLN
INTRAMUSCULAR | Status: AC
  Filled 2013-01-27: qty 1

## 2013-01-27 MED ORDER — SODIUM CHLORIDE 0.9 % IV SOLN
300.0000 mg/m2 | Freq: Once | INTRAVENOUS | Status: AC
  Administered 2013-01-27: 580 mg via INTRAVENOUS
  Filled 2013-01-27: qty 29

## 2013-01-27 MED ORDER — PROCHLORPERAZINE MALEATE 10 MG PO TABS
10.0000 mg | ORAL_TABLET | Freq: Once | ORAL | Status: AC
  Administered 2013-01-27: 10 mg via ORAL

## 2013-01-27 NOTE — Telephone Encounter (Signed)
Prior authorization for zofran ODT received and given to [employer].

## 2013-01-27 NOTE — Patient Instructions (Signed)
[facility] Discharge Instructions for Patients Receiving Chemotherapy  Today you received the following chemotherapy agents: kyprolis, cytoxan  To help prevent nausea and vomiting after your treatment, we encourage you to take your nausea medication.  Take it as often as prescribed.     If you develop nausea and vomiting that is not controlled by your nausea medication, call the clinic. If it is after clinic hours your family physician or the after hours number for the clinic or go to the Emergency Department.   BELOW ARE SYMPTOMS THAT SHOULD BE REPORTED IMMEDIATELY:  *FEVER GREATER THAN 100.5 F  *CHILLS WITH OR WITHOUT FEVER  NAUSEA AND VOMITING THAT IS NOT CONTROLLED WITH YOUR NAUSEA MEDICATION  *UNUSUAL SHORTNESS OF BREATH  *UNUSUAL BRUISING OR BLEEDING  TENDERNESS IN MOUTH AND THROAT WITH OR WITHOUT PRESENCE OF ULCERS  *URINARY PROBLEMS  *BOWEL PROBLEMS  UNUSUAL RASH Items with * indicate a potential emergency and should be followed up as soon as possible.  Feel free to call the clinic you have any questions or concerns. The clinic phone number is [phone].   I have been informed and understand all the instructions given to me. I know to contact the clinic, my physician, or go to the Emergency Department if any problems should occur. I do not have any questions at this time, but understand that I may call the clinic during office hours   should I have any questions or need assistance in obtaining follow up care.    __________________________________________  _____________  __________ Signature of Patient or Authorized Representative            Date                   Time    __________________________________________ Nurse's Signature

## 2013-01-27 NOTE — Telephone Encounter (Signed)
Per staff message and POF I have scheduled appts.  JMW  

## 2013-01-27 NOTE — Progress Notes (Addendum)
[place] Telephone:([phone]) [phone]   Fax:([phone]) [phone]   SHARED VISIT PROGRESS NOTE  PRINCIPAL DIAGNOSES:  1. Recurrent multiple myeloma initially diagnosed in 2002, initially with smoldering myeloma at [facility].  2. Ductal carcinoma in situ status post mastectomy with sentinel lymph node biopsy in October 2008.  PRIOR THERAPY:  1. Status post treatment with tamoxifen from November 2008 through February 2009, discontinued secondary to intolerance. 2. Status post 3 cycles of chemotherapy with Revlimid and Decadron followed by 1 cycle of Decadron only with mild response. 3. Status post 2 cycles of systemic chemotherapy with Velcade, Doxil and Decadron discontinued secondary to significant peripheral neuropathy. Last dose was given May 2010 at [facility]. 4. Status post autologous peripheral blood stem cell transplant on October 01, 2008 at [facility] under the care of Dr. Phyllis Ginger.  CURRENT THERAPY: Systemic chemotherapy with Carfilzomib initially was 20 mg/M2 and will be increased after cycle #1 to 36 mg/M2 on days 1, 2, 8, 9, 15 and 16 every 4 weeks in addition to Cytoxan 300 mg/M2 and Decadron 40 mg by mouth weekly basis. First cycle started on 12/29/2012. Status post 1 cycle.  INTERVAL HISTORY: Nancy Norris 73 y.o. female returns to the clinic today for followup visit. Overall she's tolerating her chemotherapy without difficulty. She complains of some trouble sleeping on the nights that she has chemotherapy. She was recently hospitalized and received 2 units of packed red blood cells. She globally feels better. She states that while she was hospitalized she was told to discontinue taking her Decadron as there was a concern as to how it may be affecting her heart. Of note patient had an echocardiogram in mid December 2014 revealing an ejection fraction between 60 and 65%. She'll so was given Zofran ODT for nausea and this worked very  well for her. She would like a prescription for the Zofran ODT. She reports that she was evaluated by Dr. Tarri Fuller young last Tuesday and has a series of "breathing tests" scheduled for 02/12/2013. She was evaluated by [facility] [employer]. She was told that she has between stage II and stage III kidney failure. They will continue to follow her closely. She is to follow up with Dr. Kyla Balzarine physician assistant at the heart care clinic next week.  She voiced no other specific complaints. She denied having any significant weight loss or night sweats. She has no chest pain, shortness of breath, cough or hemoptysis.   MEDICAL HISTORY: Past Medical History  Diagnosis Date  . Hyperkalemia   . Hypothyroidism   . COPD (chronic obstructive pulmonary disease)   . Hyponatremia   . Dizziness   . Fibromyalgia   . Breast cancer   . Multiple myeloma   . Mucositis     ALLERGIES:  is allergic to codeine; latex; iodinated diagnostic agents; other; sulfa antibiotics; and adhesive.  MEDICATIONS:  Current Outpatient Prescriptions  Medication Sig Dispense Refill  . acetaminophen (TYLENOL) 325 MG tablet Take 650 mg by mouth every 6 (six) hours as needed for mild pain or moderate pain.       Marland Kitchen albuterol (PROVENTIL HFA;VENTOLIN HFA) 108 (90 BASE) MCG/ACT inhaler Inhale 2 puffs into the lungs 2 (two) times daily as needed for wheezing or shortness of breath.      . Alum & Mag Hydroxide-Simeth (MAGIC MOUTHWASH W/LIDOCAINE) SOLN Take 5 mLs by mouth 4 (four) times daily as needed for mouth pain (swish and spit QID PRN).  240 mL  0  . ascorbic acid (VITAMIN C) 250 MG CHEW Chew 250 mg by mouth daily.      . carvedilol (COREG) 3.125 MG tablet Take 1 tablet (3.125 mg total) by mouth 2 (two) times daily with a meal.  60 tablet  0  . cetirizine (ZYRTEC) 10 MG tablet Take 10 mg by mouth at bedtime.       . Cholecalciferol (VITAMIN D3) 10000 UNITS capsule Take 10,000 Units by mouth every other day.       . citalopram (CELEXA) 40 MG tablet Take 40 mg by mouth daily with breakfast.       . Cranberry 300 MG tablet Take 300 mg by mouth daily.      . cromolyn (OPTICROM) 4 % ophthalmic solution Place 1 drop into both eyes 4 (four) times daily.      . cyanocobalamin 2000 MCG tablet Take 2,000 mcg by mouth daily.       . Ferrous Sulfate 134 MG TABS Take 1 tablet by mouth daily.       Marland Kitchen gabapentin (NEURONTIN) 300 MG capsule Take 300 mg by mouth at bedtime.      Marland Kitchen levothyroxine (SYNTHROID, LEVOTHROID) 150 MCG tablet Take 150-225 mcg by mouth daily. Takes 1 tablet daily except Saturday and Sunday she takes 1 and 1/2      . loteprednol (LOTEMAX) 0.2 % SUSP Place 1 drop into both eyes 3 (three) times daily.       . Multiple Vitamins-Minerals (HAIR/SKIN/NAILS PO) Take 1 tablet by mouth daily.      . pantoprazole (PROTONIX) 40 MG tablet Take 40 mg by mouth 2 (two) times daily.       . prochlorperazine (COMPAZINE) 10 MG tablet Take 1 tablet (10 mg total) by mouth every 6 (six) hours as needed for nausea or vomiting.  60 tablet  0  . lidocaine-prilocaine (EMLA) cream Apply 1 application topically as needed. Apply to port 1 hour before chemotherapy appt.  30 g  0  . ondansetron (ZOFRAN-ODT) 8 MG disintegrating tablet Take 1 tablet (8 mg total) by mouth every 8 (eight) hours as needed for nausea or vomiting.  20 tablet  3  . zolpidem (AMBIEN) 5 MG tablet Take 1 tablet (5 mg total) by mouth at bedtime as needed for sleep.  30 tablet  0   No current facility-administered medications for this visit.   Facility-Administered Medications Ordered in Other Visits  Medication Dose Route Frequency Provider Last Rate Last Dose  . 0.9 %  sodium chloride infusion   Intravenous Once Curt Bears, MD      . sodium chloride 0.9 % injection 10 mL  10 mL Intracatheter PRN Curt Bears, MD   10 mL at 01/05/13 1825  . sodium chloride 0.9 % injection 10 mL  10 mL Intracatheter PRN Curt Bears, MD   10 mL at 01/27/13 1335     SURGICAL HISTORY:  Past Surgical History  Procedure Laterality Date  . History of port removal    . Status post stem cell transplant on September 28, 2008.    Marland Kitchen Abdominal hysterectomy  1981  . Cholecystectomy  1971  . Mastectomy Left 2008  . Cataract extraction, bilateral      REVIEW OF SYSTEMS:  Constitutional: positive for fatigue and insomnia on chemotherapy days Eyes: negative Ears, nose, mouth, throat, and face: negative Respiratory: negative Cardiovascular: negative Gastrointestinal: positive for nausea Genitourinary:negative Integument/breast: negative Hematologic/lymphatic: negative Musculoskeletal:negative Neurological: negative Behavioral/Psych: negative Endocrine: negative Allergic/Immunologic:  negative   PHYSICAL EXAMINATION: General appearance: alert, cooperative and no distress Head: Normocephalic, without obvious abnormality, atraumatic Neck: no adenopathy Lymph nodes: Cervical, supraclavicular, and axillary nodes normal. Resp: clear to auscultation bilaterally Back: symmetric, no curvature. ROM normal. No CVA tenderness. Cardio: regular rate and rhythm, S1, S2 normal, no murmur, click, rub or gallop GI: soft, non-tender; bowel sounds normal; no masses,  no organomegaly Extremities: extremities normal, atraumatic, no cyanosis or edema Neurologic: Alert and oriented X 3, normal strength and tone. Normal symmetric reflexes. Normal coordination and gait  ECOG PERFORMANCE STATUS: 1 - Symptomatic but completely ambulatory  Blood pressure 113/67, pulse 73, temperature 98.6 F (37 C), temperature source Oral, height 5' 5.5" (1.664 m), weight 183 lb 11.2 oz (83.326 kg), SpO2 98.00%.  LABORATORY DATA: Lab Results  Component Value Date   WBC 6.1 01/27/2013   HGB 11.9 01/27/2013   HCT 36.9 01/27/2013   MCV 94.6 01/27/2013   PLT 233 01/27/2013      Chemistry      Component Value Date/Time   NA 141 01/27/2013 0846   NA 140 01/14/2013 0625   K 3.9 01/27/2013  0846   K 3.3* 01/14/2013 0625   CL 108 01/14/2013 0625   CL 105 03/19/2012 0811   CO2 21* 01/27/2013 0846   CO2 18* 01/14/2013 0625   BUN 33.7* 01/27/2013 0846   BUN 29* 01/14/2013 0625   CREATININE 2.8* 01/27/2013 0846   CREATININE 2.56* 01/14/2013 0625   CREATININE 3.15* 12/08/2012 1806      Component Value Date/Time   CALCIUM 10.0 01/27/2013 0846   CALCIUM 7.7* 01/14/2013 0625   ALKPHOS 184* 01/27/2013 0846   ALKPHOS 149* 12/01/2012 1609   AST 22 01/27/2013 0846   AST 27 12/01/2012 1609   ALT 25 01/27/2013 0846   ALT 24 12/01/2012 1609   BILITOT 0.78 01/27/2013 0846   BILITOT 0.7 12/01/2012 1609       RADIOGRAPHIC STUDIES: Dg Chest 2 View  01/13/2013   CLINICAL DATA:  Shortness of breath.  EXAM: CHEST  2 VIEW  COMPARISON:  12/02/2012.  FINDINGS: Port-A-Cath in good anatomic position. Poor inspiration. No definite pulmonary infiltrate. No pleural effusion or pneumothorax. Borderline cardiomegaly. No acute bony abnormality. Surgical clips noted over the anterior chest.  IMPRESSION: 1. Port-A-Cath in good anatomic position. 2. Borderline cardiomegaly with mild pulmonary vascular prominence. No overt congestive heart failure.   Electronically Signed   By: Marcello Moores  Register   On: 01/13/2013 13:43   Nm Pulmonary Perf And Vent  01/14/2013   CLINICAL DATA:  History of myeloma. Acute shortness of breath, right-sided chest pain, chronic renal failure.  EXAM: NUCLEAR MEDICINE VENTILATION - PERFUSION LUNG SCAN  TECHNIQUE: Ventilation images were obtained in multiple projections using inhaled aerosol technetium 99 M DTPA. Perfusion images were obtained in multiple projections after intravenous injection of Tc-58m MAA.  COMPARISON:  Chest x-ray 01/13/2013  RADIOPHARMACEUTICALS:  40 mCi Tc-54m DTPA aerosol and 6 mCi Tc-41m MAA  FINDINGS: Ventilation: No focal ventilation defect.  Perfusion: No wedge shaped peripheral perfusion defects to suggest acute pulmonary embolism.  IMPRESSION: No evidence of  pulmonary embolus.   Electronically Signed   By: Rolm Baptise M.D.   On: 01/14/2013 13:58   Ir Fluoro Guide Cv Line Right  01/01/2013   INDICATION: History of multiple myeloma, in need of intravenous access for chemotherapy administration  EXAM: IMPLANTED PORT A CATH PLACEMENT WITH ULTRASOUND AND FLUOROSCOPIC GUIDANCE  MEDICATIONS: Ancef 2 gm IV; IV antibiotic was given  in an appropriate time interval prior to skin puncture.  ANESTHESIA/SEDATION: Versed 3 mg IV; Fentanyl 100 mcg IV;  Total Moderate Sedation Time  30  minutes.  CONTRAST:  None  COMPARISON:  None.  FLUOROSCOPY TIME:  30 seconds  PROCEDURE: The procedure, risks, benefits, and alternatives were explained to the patient. Questions regarding the procedure were encouraged and answered. The patient understands and consents to the procedure.  The right neck and chest were prepped with chlorhexidine in a sterile fashion, and a sterile drape was applied covering the operative field. Maximum barrier sterile technique with sterile gowns and gloves were used for the procedure. A timeout was performed prior to the initiation of the procedure. Local anesthesia was provided with 1% lidocaine with epinephrine.  After creating a small venotomy incision, a micropuncture kit was utilized to access the internal jugular vein under direct, real-time ultrasound guidance. Ultrasound image documentation was performed. The microwire was kinked to measure appropriate catheter length.  A subcutaneous port pocket was then created along the upper chest wall (at the site of the previous right anterior chest wall port a catheter) utilizing a combination of sharp and blunt dissection. The pocket was irrigated with sterile saline. A single lumen ISP power injectable port was chosen for placement. The 8 Fr catheter was tunneled from the port pocket site to the venotomy incision. The port was placed in the pocket. The external catheter was trimmed to appropriate length. At the  venotomy, an 8 Fr peel-away sheath was placed over a guidewire under fluoroscopic guidance. The catheter was then placed through the sheath and the sheath was removed. Final catheter positioning was confirmed and documented with a fluoroscopic spot radiograph. The port was accessed with a Huber needle, aspirated and flushed with heparinized saline.  The venotomy site was closed with an interrupted 4-0 Vicryl suture. The port pocket incision was closed with interrupted 2-0 Vicryl suture and the skin was opposed with a running subcuticular 4-0 Vicryl suture. Dermabond and Steri-strips were applied to both incisions. Dressings were placed. The patient tolerated the procedure well without immediate post procedural complication.  COMPLICATIONS: None immediate  FINDINGS: After catheter placement, the tip lies within the superior cavoatrial junction. The catheter aspirates and flushes normally and is ready for immediate use.  IMPRESSION: Successful placement of a right internal jugular approach power injectable Port-A-Cath. The catheter is ready for immediate use.   Electronically Signed   By: Sandi Mariscal M.D.   On: 01/01/2013 16:35   Ir US Guide Vasc Access Right  01/01/2013   INDICATION: History of multiple myeloma, in need of intravenous access for chemotherapy administration  EXAM: IMPLANTED PORT A CATH PLACEMENT WITH ULTRASOUND AND FLUOROSCOPIC GUIDANCE  MEDICATIONS: Ancef 2 gm IV; IV antibiotic was given in an appropriate time interval prior to skin puncture.  ANESTHESIA/SEDATION: Versed 3 mg IV; Fentanyl 100 mcg IV;  Total Moderate Sedation Time  30  minutes.  CONTRAST:  None  COMPARISON:  None.  FLUOROSCOPY TIME:  30 seconds  PROCEDURE: The procedure, risks, benefits, and alternatives were explained to the patient. Questions regarding the procedure were encouraged and answered. The patient understands and consents to the procedure.  The right neck and chest were prepped with chlorhexidine in a sterile fashion,  and a sterile drape was applied covering the operative field. Maximum barrier sterile technique with sterile gowns and gloves were used for the procedure. A timeout was performed prior to the initiation of the procedure. Local anesthesia was provided with 1%  lidocaine with epinephrine.  After creating a small venotomy incision, a micropuncture kit was utilized to access the internal jugular vein under direct, real-time ultrasound guidance. Ultrasound image documentation was performed. The microwire was kinked to measure appropriate catheter length.  A subcutaneous port pocket was then created along the upper chest wall (at the site of the previous right anterior chest wall port a catheter) utilizing a combination of sharp and blunt dissection. The pocket was irrigated with sterile saline. A single lumen ISP power injectable port was chosen for placement. The 8 Fr catheter was tunneled from the port pocket site to the venotomy incision. The port was placed in the pocket. The external catheter was trimmed to appropriate length. At the venotomy, an 8 Fr peel-away sheath was placed over a guidewire under fluoroscopic guidance. The catheter was then placed through the sheath and the sheath was removed. Final catheter positioning was confirmed and documented with a fluoroscopic spot radiograph. The port was accessed with a Huber needle, aspirated and flushed with heparinized saline.  The venotomy site was closed with an interrupted 4-0 Vicryl suture. The port pocket incision was closed with interrupted 2-0 Vicryl suture and the skin was opposed with a running subcuticular 4-0 Vicryl suture. Dermabond and Steri-strips were applied to both incisions. Dressings were placed. The patient tolerated the procedure well without immediate post procedural complication.  COMPLICATIONS: None immediate  FINDINGS: After catheter placement, the tip lies within the superior cavoatrial junction. The catheter aspirates and flushes normally  and is ready for immediate use.  IMPRESSION: Successful placement of a right internal jugular approach power injectable Port-A-Cath. The catheter is ready for immediate use.   Electronically Signed   By: Sandi Mariscal M.D.   On: 01/01/2013 16:35   BONE MARROW REPORT FINAL DIAGNOSIS Diagnosis Bone Marrow, Aspirate,Biopsy, and Clot, left iliac - HYPERCELLULAR MARROW WITH MONOCLONAL PLASMACYTOSIS (10%). - SEE COMMENT. PERIPHERAL BLOOD: - NORMOCYTIC ANEMIA. Diagnosis Note The bone marrow is hypercellular with increased plasma cells (10% overall) with frequent small clusters. The plasma cells show a kappa predominance by immunohistochemistry. These findings are consistent with persistence/recurrence of the patient's prior plasma cell neoplasm. Vicente Males MD Pathologist, Electronic Signature (Case signed 12/19/2012)  ASSESSMENT AND PLAN: This is a very pleasant 73 years old white female with recurrent multiple myeloma as well as history of ductal carcinoma in situ. Her recent myeloma panel in addition to the bone marrow biopsy and aspirate showed recurrence of her disease. She is currently being treated with systemic therapy with Carfilzomib, Cytoxan and Decadron. Overall she's tolerating the chemotherapy without difficulty. Patient was discussed with also seen by Dr. Julien Nordmann.  Of note the patient's 2-D echo prior to initiating her systemic chemotherapy revealed an ejection fraction between 60-65%. She will resume her weekly and dexamethasone 40 mg by mouth once weekly as this is part of her systemic chemotherapy regimen. Patient voiced understanding. For hypercalcemia, the patient will continue on monthly Zometa. For the renal insufficiency, she will continue followup with [facility] as scheduled. The patient is encouraged to keep her appointments with pulmonology as well as cardiology. We will plan to recheck her protein studies after she has completed 2 cycles of this systemic  chemotherapy to reevaluate her disease. She was advised to call immediately she has any concerning symptoms in the interval. She return for follow up visit in 2 weeks for evaluation and management any adverse effect of her treatment.  She was advised to call immediately if she has any  concerning symptoms in the interval. All questions were answered. The patient knows to call the clinic with any problems, questions or concerns. We can certainly see the patient much sooner if necessary.  Carlton Adam PA-C  ADDENDUM: Hematology/Oncology Attending: I had face to face encounter with the patient. I recommended her care plan. This is a very pleasant 73 years old white female with relapsed multiple myeloma, currently undergoing systemic therapy with Carfilzomib, Cytoxan and dexamethasone status post 1 cycle. She tolerated the first cycle of her treatment fairly well and she has marked improvement in her renal function after starting chemotherapy. She is tolerating her treatment fairly well except for fatigue. I recommended for the patient to proceed with cycle #2 today as scheduled. She would come back for follow up visit in 2 weeks for reevaluation and management any adverse effect of her treatment. She was advised to call immediately if she has any concerning symptoms in the interval.  Disclaimer: This note was dictated with voice recognition software. Similar sounding words can inadvertently be transcribed and may not be corrected upon review. [name]., MD 02/14/2013

## 2013-01-27 NOTE — Patient Instructions (Signed)
Continue labs and chemotherapy as scheduled Resume your dexamethasone as prescribed as this is part of your chemotherapy Followup with Dr. Julien Nordmann in 2 weeks Keep your scheduled appointments with nephrology, pulmonology, and cardiology as scheduled.

## 2013-01-27 NOTE — Progress Notes (Signed)
Faxed pa form for ondansetron to [place].

## 2013-01-27 NOTE — Telephone Encounter (Signed)
Gave pt appt for lab,md and chemo for januaryb and february 2015

## 2013-01-28 ENCOUNTER — Ambulatory Visit (HOSPITAL_BASED_OUTPATIENT_CLINIC_OR_DEPARTMENT_OTHER): Payer: Medicare Other

## 2013-01-28 ENCOUNTER — Other Ambulatory Visit: Payer: Self-pay | Admitting: Internal Medicine

## 2013-01-28 VITALS — BP 129/71 | HR 69 | Temp 98.7°F | Resp 20

## 2013-01-28 DIAGNOSIS — Z5112 Encounter for antineoplastic immunotherapy: Secondary | ICD-10-CM

## 2013-01-28 DIAGNOSIS — C9002 Multiple myeloma in relapse: Secondary | ICD-10-CM

## 2013-01-28 MED ORDER — ONDANSETRON 8 MG/50ML IVPB (CHCC)
8.0000 mg | Freq: Once | INTRAVENOUS | Status: AC
  Administered 2013-01-28: 8 mg via INTRAVENOUS

## 2013-01-28 MED ORDER — CARFILZOMIB CHEMO INJECTION 60 MG
36.0000 mg/m2 | Freq: Once | INTRAVENOUS | Status: AC
  Administered 2013-01-28: 70 mg via INTRAVENOUS
  Filled 2013-01-28: qty 35

## 2013-01-28 MED ORDER — HEPARIN SOD (PORK) LOCK FLUSH 100 UNIT/ML IV SOLN
500.0000 [IU] | Freq: Once | INTRAVENOUS | Status: AC | PRN
  Administered 2013-01-28: 500 [IU]
  Filled 2013-01-28: qty 5

## 2013-01-28 MED ORDER — SODIUM CHLORIDE 0.9 % IV SOLN
Freq: Once | INTRAVENOUS | Status: AC
  Administered 2013-01-28: 10:00:00 via INTRAVENOUS

## 2013-01-28 MED ORDER — SODIUM CHLORIDE 0.9 % IJ SOLN
10.0000 mL | INTRAMUSCULAR | Status: DC | PRN
  Administered 2013-01-28: 10 mL
  Filled 2013-01-28: qty 10

## 2013-01-28 MED ORDER — DEXAMETHASONE SODIUM PHOSPHATE 10 MG/ML IJ SOLN
INTRAMUSCULAR | Status: AC
  Filled 2013-01-28: qty 1

## 2013-01-28 MED ORDER — DEXAMETHASONE SODIUM PHOSPHATE 10 MG/ML IJ SOLN
10.0000 mg | Freq: Once | INTRAMUSCULAR | Status: AC
  Administered 2013-01-28: 10 mg via INTRAVENOUS

## 2013-01-28 MED ORDER — ONDANSETRON 8 MG/NS 50 ML IVPB
INTRAVENOUS | Status: AC
  Filled 2013-01-28: qty 8

## 2013-01-28 NOTE — Patient Instructions (Signed)
[facility] Discharge Instructions for Patients Receiving Chemotherapy  Today you received the following chemotherapy agents Kyprolis.  To help prevent nausea and vomiting after your treatment, we encourage you to take your nausea medication as prescribed.   If you develop nausea and vomiting that is not controlled by your nausea medication, call the clinic.   BELOW ARE SYMPTOMS THAT SHOULD BE REPORTED IMMEDIATELY:  *FEVER GREATER THAN 100.5 F  *CHILLS WITH OR WITHOUT FEVER  NAUSEA AND VOMITING THAT IS NOT CONTROLLED WITH YOUR NAUSEA MEDICATION  *UNUSUAL SHORTNESS OF BREATH  *UNUSUAL BRUISING OR BLEEDING  TENDERNESS IN MOUTH AND THROAT WITH OR WITHOUT PRESENCE OF ULCERS  *URINARY PROBLEMS  *BOWEL PROBLEMS  UNUSUAL RASH Items with * indicate a potential emergency and should be followed up as soon as possible.  Feel free to call the clinic you have any questions or concerns. The clinic phone number is [phone].

## 2013-01-29 ENCOUNTER — Ambulatory Visit (INDEPENDENT_AMBULATORY_CARE_PROVIDER_SITE_OTHER): Payer: Medicare Other | Admitting: Physician Assistant

## 2013-01-29 ENCOUNTER — Encounter: Payer: Self-pay | Admitting: Physician Assistant

## 2013-01-29 VITALS — BP 146/84 | HR 65 | Ht 65.0 in | Wt 187.0 lb

## 2013-01-29 DIAGNOSIS — N183 Chronic kidney disease, stage 3 unspecified: Secondary | ICD-10-CM

## 2013-01-29 DIAGNOSIS — K219 Gastro-esophageal reflux disease without esophagitis: Secondary | ICD-10-CM

## 2013-01-29 DIAGNOSIS — R079 Chest pain, unspecified: Secondary | ICD-10-CM

## 2013-01-29 DIAGNOSIS — I10 Essential (primary) hypertension: Secondary | ICD-10-CM

## 2013-01-29 DIAGNOSIS — I5032 Chronic diastolic (congestive) heart failure: Secondary | ICD-10-CM

## 2013-01-29 DIAGNOSIS — R0602 Shortness of breath: Secondary | ICD-10-CM

## 2013-01-29 DIAGNOSIS — C9001 Multiple myeloma in remission: Secondary | ICD-10-CM

## 2013-01-29 MED ORDER — ASPIRIN EC 81 MG PO TBEC: 81.0000 mg | DELAYED_RELEASE_TABLET | Freq: Every day | ORAL | Status: DC

## 2013-01-29 MED ORDER — ISOSORBIDE MONONITRATE ER 30 MG PO TB24: 30.0000 mg | ORAL_TABLET | Freq: Every day | ORAL | Status: DC

## 2013-01-29 MED ORDER — NITROGLYCERIN 0.4 MG SL SUBL: 0.4000 mg | SUBLINGUAL_TABLET | SUBLINGUAL | Status: DC | PRN

## 2013-01-29 NOTE — Patient Instructions (Signed)
START IMDUR 30 MG DAILY; RX SENT IN   AN RX FOR NITROGLYCERIN HAS BEEN SENT IN AND YOU HAVE BEEN ADVISED AS TO HOW AND WHEN TO USE NTG  Your physician has requested that you have en exercise stress myoview. For further information please visit [email or web address]. Please follow instruction sheet, as given.  Your physician has requested that you have an echocardiogram. Echocardiography is a painless test that uses sound waves to create images of your heart. It provides your doctor with information about the size and shape of your heart and how well your heart's chambers and valves are working. This procedure takes approximately one hour. There are no restrictions for this procedure.  Your physician recommends that you schedule a follow-up appointment in: WITH DR. Phylliss Bob IN 2-3 WEEKS ONCE THE TEST ARE COMPLETED

## 2013-01-29 NOTE — Progress Notes (Signed)
[address], [place], [place]  [address] Phone: [phone] Fax:  [phone]  Date:  01/29/2013   ID:  Nancy Norris, DOB [birth year], MRN [id number]  PCP:  [name], MD  Cardiologist:  Dr. Jenkins Rouge  Nephrologist:  Dr. Marval Regal    History of Present Illness: Nancy Norris is a 73 y.o. female with a hx of recurrent multiple myeloma (s/p stem cell transplant in 2010), COPD, hypothyroidism, ductal carcinoma in situ status post mastectomy, CKD, HTN, GERD. She was evaluated by Dr. [name] 2012 for surgical clearance. She had an abnormal ECG.  An echocardiogram demonstrated normal LV function and she was cleared for surgery. Recent Echocardiogram (12/26/2012): EF 81-01%, grade 1 diastolic dysfunction.  She was recently admitted 12/30-1/1 with abrupt onset of dyspnea (while coming back from chemotherapy).  She was profoundly anemic with a hemoglobin of 6.8. She was transfused with packed red blood cells. She had worsening renal function. Chest CT angiogram was not performed. VQ scan was negative for pulmonary embolism.  LE venous Dopplers were negative for DVT bilaterally.  There was some concern for volume overload and she was treated with IV Lasix x1.  She was evaluated by Dr. Annamaria Boots for pulmonology. PFTs have been scheduled. Patient noted chest discomfort as well and she has been referred back to cardiology for evaluation.  The patient has had symptoms of chest squeezing or throbbing with any type of activity since she was admitted to the hospital 01/13/2013. Cardiac markers were negative at that time. She notes radiation of her chest discomfort up into her jaw. She has associated dyspnea. She denies diaphoresis. She has consistent nausea related to chemotherapy. She denies any resting chest discomfort. Her symptoms typically resolve with rest in less than 15 minutes. She denies syncope. She denies orthopnea. She denies significant LE edema.  She also notes some pleuritic  chest discomfort. She denies any chest pain related to meals. She does have dysphagia for the last 2-3 months. She denies odynophagia. She does have indigestion. This is somewhat controlled by PPI therapy.  Recent Labs: 12/02/2012: TSH 0.834  01/13/2013: Pro B Natriuretic peptide (BNP) 3915.0*  01/27/2013: ALT 25; Creatinine 2.8*; Hemoglobin 11.9; Potassium 3.9   Wt Readings from Last 3 Encounters:  01/29/13 187 lb (84.823 kg)  01/27/13 183 lb 11.2 oz (83.326 kg)  01/20/13 186 lb 6.4 oz (84.55 kg)     Past Medical History  Diagnosis Date  . Hyperkalemia   . Hypothyroidism   . COPD (chronic obstructive pulmonary disease)   . Hyponatremia   . Dizziness   . Fibromyalgia   . Breast cancer   . Multiple myeloma   . Mucositis   . CKD (chronic kidney disease) stage 3, GFR 30-59 ml/min   . Hx of echocardiogram     Echocardiogram (12/26/2012): EF 75-10%, grade 1 diastolic dysfunction    Current Outpatient Prescriptions  Medication Sig Dispense Refill  . acetaminophen (TYLENOL) 325 MG tablet Take 650 mg by mouth every 6 (six) hours as needed for mild pain or moderate pain.       Marland Kitchen albuterol (PROVENTIL HFA;VENTOLIN HFA) 108 (90 BASE) MCG/ACT inhaler Inhale 2 puffs into the lungs 2 (two) times daily as needed for wheezing or shortness of breath.      . Alum & Mag Hydroxide-Simeth (MAGIC MOUTHWASH W/LIDOCAINE) SOLN Take 5 mLs by mouth 4 (four) times daily as needed for mouth pain (swish and spit QID PRN).  240 mL  0  .  ascorbic acid (VITAMIN C) 250 MG CHEW Chew 250 mg by mouth daily.      . carvedilol (COREG) 3.125 MG tablet Take 1 tablet (3.125 mg total) by mouth 2 (two) times daily with a meal.  60 tablet  0  . cetirizine (ZYRTEC) 10 MG tablet Take 10 mg by mouth at bedtime.       . Cholecalciferol (VITAMIN D3) 10000 UNITS capsule Take 10,000 Units by mouth every other day.      . citalopram (CELEXA) 40 MG tablet Take 40 mg by mouth daily with breakfast.       . Cranberry 300 MG tablet  Take 300 mg by mouth daily.      . cromolyn (OPTICROM) 4 % ophthalmic solution Place 1 drop into both eyes 4 (four) times daily.      . cyanocobalamin 2000 MCG tablet Take 2,000 mcg by mouth daily.       . Ferrous Sulfate 134 MG TABS Take 1 tablet by mouth daily.       Marland Kitchen gabapentin (NEURONTIN) 300 MG capsule Take 300 mg by mouth at bedtime.      Marland Kitchen levothyroxine (SYNTHROID, LEVOTHROID) 150 MCG tablet TAKE 1 TABLET BY MOUTH ONCE DAILY.  90 tablet  1  . lidocaine-prilocaine (EMLA) cream Apply 1 application topically as needed. Apply to port 1 hour before chemotherapy appt.  30 g  0  . loteprednol (LOTEMAX) 0.2 % SUSP Place 1 drop into both eyes 3 (three) times daily.       . Multiple Vitamins-Minerals (HAIR/SKIN/NAILS PO) Take 1 tablet by mouth daily.      . ondansetron (ZOFRAN-ODT) 8 MG disintegrating tablet Take 1 tablet (8 mg total) by mouth every 8 (eight) hours as needed for nausea or vomiting.  20 tablet  3  . pantoprazole (PROTONIX) 40 MG tablet TAKE 1 TABLET BY MOUTH TWICE A DAY WITH MEALS.  60 tablet  1  . prochlorperazine (COMPAZINE) 10 MG tablet Take 1 tablet (10 mg total) by mouth every 6 (six) hours as needed for nausea or vomiting.  60 tablet  0  . zolpidem (AMBIEN) 5 MG tablet Take 1 tablet (5 mg total) by mouth at bedtime as needed for sleep.  30 tablet  0   No current facility-administered medications for this visit.   Facility-Administered Medications Ordered in Other Visits  Medication Dose Route Frequency Provider Last Rate Last Dose  . sodium chloride 0.9 % injection 10 mL  10 mL Intracatheter PRN Curt Bears, MD   10 mL at 01/05/13 1825    Allergies:   Codeine; Latex; Iodinated diagnostic agents; Other; Sulfa antibiotics; and Adhesive   Social History:  The patient  reports that she quit smoking about 7 years ago. Her smoking use included Cigarettes. She has a 30 pack-year smoking history. She has never used smokeless tobacco. She reports that she does not drink alcohol  or use illicit drugs.   Family History:  The patient's family history includes Arthritis in her mother; Asthma in her mother.   ROS:  Please see the history of present illness.   She denies fevers, chills, cough, melena, hematochezia.   All other systems reviewed and negative.   PHYSICAL EXAM: VS:  BP 146/84  Pulse 65  Ht $R'5\' 5"'fx$  (1.651 m)  Wt 187 lb (84.823 kg)  BMI 31.12 kg/m2 Well nourished, well developed, in no acute distress HEENT: normal Neck: no JVD Vascular: No carotid bruit Chest: Tender to palpation Cardiac:  normal S1, S2;  RRR; no murmurno rub Lungs:  clear to auscultation bilaterally, no wheezing, rhonchi or rales Abd: soft, + epigastric tenderness, no hepatomegaly Ext: no edema Skin: warm and dry Neuro:  CNs 2-12 intact, no focal abnormalities noted  EKG:  NSR, HR 65, normal axis, nonspecific ST-T wave changes     ASSESSMENT AND PLAN:  1. Chest Pain: The patient has symptoms of exertional chest discomfort consistent with CCS class III angina. She certainly has risk factors for coronary artery disease. However, she has significant CKD. She would be at high risk for contrast-induced nephropathy with cardiac catheterization. She also has chest discomfort with palpation as well as symptoms consistent with GERD. She also has epigastric discomfort on palpation. She also has some pleuritic component to her chest pain. I have recommended proceeding with an echocardiogram (rule out effusion). We will also obtain a Lexiscan Myoview for risk stratification. I will place her on isosorbide mononitrate 30 mg daily for medical therapy. I have given her a prescription for nitroglycerin to use as needed. She will remain on aspirin. If her Myoview is low risk, consider continuing with medical therapy. If it is high risk, we will need to discuss with nephrology regarding cardiac catheterization.  2. GERD:  As noted, she does have some symptoms that sound consistent with GI etiology. If her  Myoview is normal, consider referral to gastroenterology. 3. Hypertension: Controlled. 4. CKD: Continue follow up with nephrology. 5. Multiple Myeloma: Continue follow up with oncology. 6. COPD: Formal testing is pending. Follow up with pulmonology as planned. 7.  Disposition: Follow up with Dr. [name] or me in 2-3 weeksto  Signed, Richardson Dopp, PA-C  01/29/2013 10:38 AM

## 2013-01-30 ENCOUNTER — Encounter: Payer: Self-pay | Admitting: Internal Medicine

## 2013-01-30 NOTE — Progress Notes (Signed)
[employer] approved ondansetron from 01/27/13-01/27/14

## 2013-02-02 ENCOUNTER — Other Ambulatory Visit: Payer: No Typology Code available for payment source

## 2013-02-03 ENCOUNTER — Other Ambulatory Visit (HOSPITAL_BASED_OUTPATIENT_CLINIC_OR_DEPARTMENT_OTHER): Payer: Medicare Other

## 2013-02-03 ENCOUNTER — Ambulatory Visit (HOSPITAL_BASED_OUTPATIENT_CLINIC_OR_DEPARTMENT_OTHER): Payer: Medicare Other

## 2013-02-03 VITALS — BP 113/61 | HR 71 | Temp 98.2°F | Resp 18

## 2013-02-03 DIAGNOSIS — Z5111 Encounter for antineoplastic chemotherapy: Secondary | ICD-10-CM

## 2013-02-03 DIAGNOSIS — C9002 Multiple myeloma in relapse: Secondary | ICD-10-CM

## 2013-02-03 DIAGNOSIS — Z5112 Encounter for antineoplastic immunotherapy: Secondary | ICD-10-CM

## 2013-02-03 LAB — CBC WITH DIFFERENTIAL/PLATELET
BASO%: 0.1 % (ref 0.0–2.0)
Basophils Absolute: 0 10*3/uL (ref 0.0–0.1)
EOS%: 2.6 % (ref 0.0–7.0)
Eosinophils Absolute: 0.2 10*3/uL (ref 0.0–0.5)
HCT: 34.4 % — ABNORMAL LOW (ref 34.8–46.6)
HGB: 11.2 g/dL — ABNORMAL LOW (ref 11.6–15.9)
LYMPH%: 14.9 % (ref 14.0–49.7)
MCH: 30.6 pg (ref 25.1–34.0)
MCHC: 32.6 g/dL (ref 31.5–36.0)
MCV: 94 fL (ref 79.5–101.0)
MONO#: 0.4 10*3/uL (ref 0.1–0.9)
MONO%: 5.3 % (ref 0.0–14.0)
NEUT#: 5.4 10*3/uL (ref 1.5–6.5)
NEUT%: 77.1 % — ABNORMAL HIGH (ref 38.4–76.8)
Platelets: 117 10*3/uL — ABNORMAL LOW (ref 145–400)
RBC: 3.66 10*6/uL — AB (ref 3.70–5.45)
RDW: 14 % (ref 11.2–14.5)
WBC: 7 10*3/uL (ref 3.9–10.3)
lymph#: 1 10*3/uL (ref 0.9–3.3)

## 2013-02-03 LAB — COMPREHENSIVE METABOLIC PANEL (CC13)
ALT: 25 U/L (ref 0–55)
ANION GAP: 10 meq/L (ref 3–11)
AST: 23 U/L (ref 5–34)
Albumin: 3.5 g/dL (ref 3.5–5.0)
Alkaline Phosphatase: 149 U/L (ref 40–150)
BUN: 37 mg/dL — ABNORMAL HIGH (ref 7.0–26.0)
CO2: 21 mEq/L — ABNORMAL LOW (ref 22–29)
CREATININE: 2.9 mg/dL — AB (ref 0.6–1.1)
Calcium: 9.8 mg/dL (ref 8.4–10.4)
Chloride: 107 mEq/L (ref 98–109)
GLUCOSE: 113 mg/dL (ref 70–140)
Potassium: 4.6 mEq/L (ref 3.5–5.1)
Sodium: 137 mEq/L (ref 136–145)
Total Bilirubin: 0.78 mg/dL (ref 0.20–1.20)
Total Protein: 6.4 g/dL (ref 6.4–8.3)

## 2013-02-03 MED ORDER — SODIUM CHLORIDE 0.9 % IJ SOLN
10.0000 mL | INTRAMUSCULAR | Status: DC | PRN
  Administered 2013-02-03: 10 mL
  Filled 2013-02-03: qty 10

## 2013-02-03 MED ORDER — DEXAMETHASONE SODIUM PHOSPHATE 10 MG/ML IJ SOLN
INTRAMUSCULAR | Status: AC
  Filled 2013-02-03: qty 1

## 2013-02-03 MED ORDER — SODIUM CHLORIDE 0.9 % IV SOLN
Freq: Once | INTRAVENOUS | Status: AC
  Administered 2013-02-03: 10:00:00 via INTRAVENOUS

## 2013-02-03 MED ORDER — HEPARIN SOD (PORK) LOCK FLUSH 100 UNIT/ML IV SOLN
500.0000 [IU] | Freq: Once | INTRAVENOUS | Status: AC | PRN
  Administered 2013-02-03: 500 [IU]
  Filled 2013-02-03: qty 5

## 2013-02-03 MED ORDER — DEXAMETHASONE SODIUM PHOSPHATE 10 MG/ML IJ SOLN
10.0000 mg | Freq: Once | INTRAMUSCULAR | Status: AC
  Administered 2013-02-03: 10 mg via INTRAVENOUS

## 2013-02-03 MED ORDER — ONDANSETRON 8 MG/NS 50 ML IVPB
INTRAVENOUS | Status: AC
  Filled 2013-02-03: qty 8

## 2013-02-03 MED ORDER — CARFILZOMIB CHEMO INJECTION 60 MG
36.0000 mg/m2 | Freq: Once | INTRAVENOUS | Status: AC
  Administered 2013-02-03: 70 mg via INTRAVENOUS
  Filled 2013-02-03: qty 35

## 2013-02-03 MED ORDER — ONDANSETRON 8 MG/50ML IVPB (CHCC)
8.0000 mg | Freq: Once | INTRAVENOUS | Status: AC
  Administered 2013-02-03: 8 mg via INTRAVENOUS

## 2013-02-03 MED ORDER — SODIUM CHLORIDE 0.9 % IV SOLN
300.0000 mg/m2 | Freq: Once | INTRAVENOUS | Status: AC
  Administered 2013-02-03: 580 mg via INTRAVENOUS
  Filled 2013-02-03: qty 29

## 2013-02-03 NOTE — Patient Instructions (Signed)
[facility] Discharge Instructions for Patients Receiving Chemotherapy  Today you received the following chemotherapy agents Cytoxan, Kyprolis.   To help prevent nausea and vomiting after your treatment, we encourage you to take your nausea medication as prescribed.     If you develop nausea and vomiting that is not controlled by your nausea medication, call the clinic.   BELOW ARE SYMPTOMS THAT SHOULD BE REPORTED IMMEDIATELY:  *FEVER GREATER THAN 100.5 F  *CHILLS WITH OR WITHOUT FEVER  NAUSEA AND VOMITING THAT IS NOT CONTROLLED WITH YOUR NAUSEA MEDICATION  *UNUSUAL SHORTNESS OF BREATH  *UNUSUAL BRUISING OR BLEEDING  TENDERNESS IN MOUTH AND THROAT WITH OR WITHOUT PRESENCE OF ULCERS  *URINARY PROBLEMS  *BOWEL PROBLEMS  UNUSUAL RASH Items with * indicate a potential emergency and should be followed up as soon as possible.  Feel free to call the clinic you have any questions or concerns. The clinic phone number is [phone].  It was my pleasure to take care of you today! Leeanne Rio, RN

## 2013-02-04 ENCOUNTER — Ambulatory Visit (HOSPITAL_BASED_OUTPATIENT_CLINIC_OR_DEPARTMENT_OTHER): Payer: Medicare Other

## 2013-02-04 DIAGNOSIS — C9002 Multiple myeloma in relapse: Secondary | ICD-10-CM

## 2013-02-04 DIAGNOSIS — Z5112 Encounter for antineoplastic immunotherapy: Secondary | ICD-10-CM

## 2013-02-04 MED ORDER — SODIUM CHLORIDE 0.9 % IV SOLN
Freq: Once | INTRAVENOUS | Status: AC
  Administered 2013-02-04: 11:00:00 via INTRAVENOUS

## 2013-02-04 MED ORDER — SODIUM CHLORIDE 0.9 % IJ SOLN
10.0000 mL | INTRAMUSCULAR | Status: DC | PRN
  Administered 2013-02-04: 10 mL
  Filled 2013-02-04: qty 10

## 2013-02-04 MED ORDER — DEXTROSE 5 % IV SOLN
36.0000 mg/m2 | Freq: Once | INTRAVENOUS | Status: AC
  Administered 2013-02-04: 70 mg via INTRAVENOUS
  Filled 2013-02-04: qty 35

## 2013-02-04 MED ORDER — DEXAMETHASONE SODIUM PHOSPHATE 10 MG/ML IJ SOLN
10.0000 mg | Freq: Once | INTRAMUSCULAR | Status: AC
  Administered 2013-02-04: 10 mg via INTRAVENOUS

## 2013-02-04 MED ORDER — ONDANSETRON 8 MG/NS 50 ML IVPB
INTRAVENOUS | Status: AC
  Filled 2013-02-04: qty 8

## 2013-02-04 MED ORDER — HEPARIN SOD (PORK) LOCK FLUSH 100 UNIT/ML IV SOLN
500.0000 [IU] | Freq: Once | INTRAVENOUS | Status: AC | PRN
  Administered 2013-02-04: 500 [IU]
  Filled 2013-02-04: qty 5

## 2013-02-04 MED ORDER — ONDANSETRON 8 MG/50ML IVPB (CHCC)
8.0000 mg | Freq: Once | INTRAVENOUS | Status: AC
  Administered 2013-02-04: 8 mg via INTRAVENOUS

## 2013-02-04 MED ORDER — DEXAMETHASONE SODIUM PHOSPHATE 10 MG/ML IJ SOLN
INTRAMUSCULAR | Status: AC
  Filled 2013-02-04: qty 1

## 2013-02-04 NOTE — Patient Instructions (Signed)
[facility] Discharge Instructions for Patients Receiving Chemotherapy  Today you received the following chemotherapy agents Kyprolis.   To help prevent nausea and vomiting after your treatment, we encourage you to take your nausea medication as prescribed.     If you develop nausea and vomiting that is not controlled by your nausea medication, call the clinic.   BELOW ARE SYMPTOMS THAT SHOULD BE REPORTED IMMEDIATELY:  *FEVER GREATER THAN 100.5 F  *CHILLS WITH OR WITHOUT FEVER  NAUSEA AND VOMITING THAT IS NOT CONTROLLED WITH YOUR NAUSEA MEDICATION  *UNUSUAL SHORTNESS OF BREATH  *UNUSUAL BRUISING OR BLEEDING  TENDERNESS IN MOUTH AND THROAT WITH OR WITHOUT PRESENCE OF ULCERS  *URINARY PROBLEMS  *BOWEL PROBLEMS  UNUSUAL RASH Items with * indicate a potential emergency and should be followed up as soon as possible.  Feel free to call the clinic should you have any questions or concerns. The clinic phone number is [phone].  It was my pleasure to take care of you today!  Leeanne Rio, RN

## 2013-02-09 ENCOUNTER — Other Ambulatory Visit: Payer: Medicare Other

## 2013-02-10 ENCOUNTER — Telehealth: Payer: Self-pay | Admitting: Internal Medicine

## 2013-02-10 ENCOUNTER — Ambulatory Visit (HOSPITAL_BASED_OUTPATIENT_CLINIC_OR_DEPARTMENT_OTHER): Payer: Medicare Other

## 2013-02-10 ENCOUNTER — Encounter: Payer: Self-pay | Admitting: Physician Assistant

## 2013-02-10 ENCOUNTER — Other Ambulatory Visit (HOSPITAL_BASED_OUTPATIENT_CLINIC_OR_DEPARTMENT_OTHER): Payer: Medicare Other

## 2013-02-10 ENCOUNTER — Ambulatory Visit (HOSPITAL_BASED_OUTPATIENT_CLINIC_OR_DEPARTMENT_OTHER): Payer: Medicare Other | Admitting: Physician Assistant

## 2013-02-10 VITALS — BP 91/48 | HR 67 | Temp 97.3°F | Resp 18 | Ht 65.0 in | Wt 184.0 lb

## 2013-02-10 DIAGNOSIS — Z5111 Encounter for antineoplastic chemotherapy: Secondary | ICD-10-CM

## 2013-02-10 DIAGNOSIS — C9002 Multiple myeloma in relapse: Secondary | ICD-10-CM

## 2013-02-10 DIAGNOSIS — R5381 Other malaise: Secondary | ICD-10-CM

## 2013-02-10 DIAGNOSIS — Z853 Personal history of malignant neoplasm of breast: Secondary | ICD-10-CM

## 2013-02-10 DIAGNOSIS — I959 Hypotension, unspecified: Secondary | ICD-10-CM

## 2013-02-10 DIAGNOSIS — F3289 Other specified depressive episodes: Secondary | ICD-10-CM

## 2013-02-10 DIAGNOSIS — F329 Major depressive disorder, single episode, unspecified: Secondary | ICD-10-CM

## 2013-02-10 DIAGNOSIS — R5383 Other fatigue: Secondary | ICD-10-CM

## 2013-02-10 DIAGNOSIS — Z5112 Encounter for antineoplastic immunotherapy: Secondary | ICD-10-CM

## 2013-02-10 DIAGNOSIS — C9001 Multiple myeloma in remission: Secondary | ICD-10-CM

## 2013-02-10 DIAGNOSIS — N289 Disorder of kidney and ureter, unspecified: Secondary | ICD-10-CM

## 2013-02-10 LAB — CBC WITH DIFFERENTIAL/PLATELET
BASO%: 0.4 % (ref 0.0–2.0)
Basophils Absolute: 0 10*3/uL (ref 0.0–0.1)
EOS%: 4.6 % (ref 0.0–7.0)
Eosinophils Absolute: 0.2 10*3/uL (ref 0.0–0.5)
HCT: 33.9 % — ABNORMAL LOW (ref 34.8–46.6)
HGB: 11.1 g/dL — ABNORMAL LOW (ref 11.6–15.9)
LYMPH%: 24.2 % (ref 14.0–49.7)
MCH: 30.6 pg (ref 25.1–34.0)
MCHC: 32.7 g/dL (ref 31.5–36.0)
MCV: 93.4 fL (ref 79.5–101.0)
MONO#: 0.4 10*3/uL (ref 0.1–0.9)
MONO%: 9.3 % (ref 0.0–14.0)
NEUT#: 2.8 10*3/uL (ref 1.5–6.5)
NEUT%: 61.5 % (ref 38.4–76.8)
Platelets: 128 10*3/uL — ABNORMAL LOW (ref 145–400)
RBC: 3.63 10*6/uL — ABNORMAL LOW (ref 3.70–5.45)
RDW: 13.8 % (ref 11.2–14.5)
WBC: 4.5 10*3/uL (ref 3.9–10.3)
lymph#: 1.1 10*3/uL (ref 0.9–3.3)
nRBC: 0 % (ref 0–0)

## 2013-02-10 LAB — COMPREHENSIVE METABOLIC PANEL (CC13)
ALT: 59 U/L — ABNORMAL HIGH (ref 0–55)
AST: 63 U/L — ABNORMAL HIGH (ref 5–34)
Albumin: 3.1 g/dL — ABNORMAL LOW (ref 3.5–5.0)
Alkaline Phosphatase: 154 U/L — ABNORMAL HIGH (ref 40–150)
Anion Gap: 8 mEq/L (ref 3–11)
BUN: 35.1 mg/dL — ABNORMAL HIGH (ref 7.0–26.0)
CO2: 19 mEq/L — ABNORMAL LOW (ref 22–29)
Calcium: 8.3 mg/dL — ABNORMAL LOW (ref 8.4–10.4)
Chloride: 111 mEq/L — ABNORMAL HIGH (ref 98–109)
Creatinine: 2.2 mg/dL — ABNORMAL HIGH (ref 0.6–1.1)
Glucose: 137 mg/dl (ref 70–140)
Potassium: 4.4 mEq/L (ref 3.5–5.1)
Sodium: 139 mEq/L (ref 136–145)
Total Bilirubin: 0.59 mg/dL (ref 0.20–1.20)
Total Protein: 5.8 g/dL — ABNORMAL LOW (ref 6.4–8.3)

## 2013-02-10 MED ORDER — ONDANSETRON 8 MG/50ML IVPB (CHCC)
8.0000 mg | Freq: Once | INTRAVENOUS | Status: AC
  Administered 2013-02-10: 8 mg via INTRAVENOUS

## 2013-02-10 MED ORDER — SODIUM CHLORIDE 0.9 % IV SOLN
Freq: Once | INTRAVENOUS | Status: AC
  Administered 2013-02-10: 10:00:00 via INTRAVENOUS

## 2013-02-10 MED ORDER — HEPARIN SOD (PORK) LOCK FLUSH 100 UNIT/ML IV SOLN
500.0000 [IU] | Freq: Once | INTRAVENOUS | Status: AC | PRN
  Administered 2013-02-10: 500 [IU]
  Filled 2013-02-10: qty 5

## 2013-02-10 MED ORDER — SODIUM CHLORIDE 0.9 % IV SOLN
300.0000 mg/m2 | Freq: Once | INTRAVENOUS | Status: AC
  Administered 2013-02-10: 580 mg via INTRAVENOUS
  Filled 2013-02-10: qty 29

## 2013-02-10 MED ORDER — DEXAMETHASONE SODIUM PHOSPHATE 10 MG/ML IJ SOLN
10.0000 mg | Freq: Once | INTRAMUSCULAR | Status: AC
  Administered 2013-02-10: 10 mg via INTRAVENOUS

## 2013-02-10 MED ORDER — DEXTROSE 5 % IV SOLN
36.0000 mg/m2 | Freq: Once | INTRAVENOUS | Status: AC
  Administered 2013-02-10: 70 mg via INTRAVENOUS
  Filled 2013-02-10: qty 35

## 2013-02-10 MED ORDER — SODIUM CHLORIDE 0.9 % IJ SOLN
10.0000 mL | INTRAMUSCULAR | Status: DC | PRN
  Administered 2013-02-10: 10 mL
  Filled 2013-02-10: qty 10

## 2013-02-10 MED ORDER — ZOLEDRONIC ACID 4 MG/5ML IV CONC
3.0000 mg | Freq: Once | INTRAVENOUS | Status: AC
  Administered 2013-02-10: 3 mg via INTRAVENOUS
  Filled 2013-02-10: qty 3.75

## 2013-02-10 MED ORDER — DEXAMETHASONE SODIUM PHOSPHATE 10 MG/ML IJ SOLN
INTRAMUSCULAR | Status: AC
  Filled 2013-02-10: qty 1

## 2013-02-10 MED ORDER — ONDANSETRON 8 MG/NS 50 ML IVPB
INTRAVENOUS | Status: AC
  Filled 2013-02-10: qty 8

## 2013-02-10 NOTE — Progress Notes (Addendum)
[place] [phone]   [phone]   SHARED VISIT PROGRESS NOTE  PRINCIPAL DIAGNOSES:  1. Recurrent multiple myeloma initially diagnosed in 2002, initially with smoldering myeloma at [facility].  2. Ductal carcinoma in situ status post mastectomy with sentinel lymph node biopsy in October 2008.  PRIOR THERAPY:  1. Status post treatment with tamoxifen from November 2008 through February 2009, discontinued secondary to intolerance. 2. Status post 3 cycles of chemotherapy with Revlimid and Decadron followed by 1 cycle of Decadron only with mild response. 3. Status post 2 cycles of systemic chemotherapy with Velcade, Doxil and Decadron discontinued secondary to significant peripheral neuropathy. Last dose was given May 2010 at [facility]. 4. Status post autologous peripheral blood stem cell transplant on October 01, 2008 at [facility] under the care of Dr. Phyllis Ginger.  CURRENT THERAPY: Systemic chemotherapy with Carfilzomib initially was 20 mg/M2 and will be increased after cycle #1 to 36 mg/M2 on days 1, 2, 8, 9, 15 and 16 every 4 weeks in addition to Cytoxan 300 mg/M2 and Decadron 40 mg by mouth weekly basis. First cycle started on 12/29/2012. Status post 1 cycle, as well as days 1, 2, 8 and 9 of cycle #2.  INTERVAL HISTORY: Nancy Norris 73 y.o. female returns to the clinic today for followup visit. Overall she's tolerating her chemotherapy without significant difficulty. Today she is very down and admits to feeling depressed. She has been on Celexa since prior to her stem cell transplant. She is currently on the maximum dose of 40 mg by mouth daily. She states that she feels as though she is just "existing". She is not going out, has not been to church and over 3 weeks, is not seeing any 100 doing anything substance. She states yesterday she played on her computer for several hours and neglected to eat or drink much of  anything throughout the day. She is to see her primary care physician, Dr. Wayland Denis on 02/20/2013. She states that he is 1 prescribed Celexa for her. She also has several other upcoming appointments for pulmonary function tests this Thursday and the next few weeks and echocardiogram followed by stress test and then a followup appointment with Dr. Roosvelt Harps. She reports a sensation of not being to catch her breath when she is lying flat. She states she feels as though her heart races and finds it hard to breathe. She will inform her cardiologist as well as her pulmonologist of the symptoms. She voiced no other specific complaints. She denied having any significant weight loss or night sweats. She has no chest pain, shortness of breath, cough or hemoptysis.   MEDICAL HISTORY: Past Medical History  Diagnosis Date  . Hyperkalemia   . Hypothyroidism   . COPD (chronic obstructive pulmonary disease)   . Hyponatremia   . Dizziness   . Fibromyalgia   . Breast cancer   . Multiple myeloma   . Mucositis   . CKD (chronic kidney disease) stage 3, GFR 30-59 ml/min   . Hx of echocardiogram     Echocardiogram (12/26/2012): EF 17-61%, grade 1 diastolic dysfunction    ALLERGIES:  is allergic to codeine; latex; iodinated diagnostic agents; other; sulfa antibiotics; and adhesive.  MEDICATIONS:  Current Outpatient Prescriptions  Medication Sig Dispense Refill  . acetaminophen (TYLENOL) 325 MG tablet Take 650 mg by mouth every 6 (six) hours as needed for mild pain or moderate pain.       Marland Kitchen  albuterol (PROVENTIL HFA;VENTOLIN HFA) 108 (90 BASE) MCG/ACT inhaler Inhale 2 puffs into the lungs 2 (two) times daily as needed for wheezing or shortness of breath.      . Alum & Mag Hydroxide-Simeth (MAGIC MOUTHWASH W/LIDOCAINE) SOLN Take 5 mLs by mouth 4 (four) times daily as needed for mouth pain (swish and spit QID PRN).  240 mL  0  . ascorbic acid (VITAMIN C) 250 MG CHEW Chew 250 mg by mouth daily.      Marland Kitchen aspirin EC 81 MG  tablet Take 1 tablet (81 mg total) by mouth daily.      . carvedilol (COREG) 3.125 MG tablet Take 1 tablet (3.125 mg total) by mouth 2 (two) times daily with a meal.  60 tablet  0  . cetirizine (ZYRTEC) 10 MG tablet Take 10 mg by mouth at bedtime.       . Cholecalciferol (VITAMIN D3) 10000 UNITS capsule Take 10,000 Units by mouth every other day.      . citalopram (CELEXA) 40 MG tablet Take 40 mg by mouth daily with breakfast.       . cromolyn (OPTICROM) 4 % ophthalmic solution Place 1 drop into both eyes 4 (four) times daily.      . cyanocobalamin 2000 MCG tablet Take 2,000 mcg by mouth daily.       Marland Kitchen dexamethasone (DECADRON) 4 MG tablet       . Ferrous Sulfate 134 MG TABS Take 1 tablet by mouth daily.       Marland Kitchen gabapentin (NEURONTIN) 300 MG capsule Take 300 mg by mouth at bedtime.      . lidocaine-prilocaine (EMLA) cream Apply 1 application topically as needed. Apply to port 1 hour before chemotherapy appt.  30 g  0  . loteprednol (LOTEMAX) 0.2 % SUSP Place 1 drop into both eyes 3 (three) times daily.       . Multiple Vitamins-Minerals (HAIR/SKIN/NAILS PO) Take 1 tablet by mouth daily.      . ondansetron (ZOFRAN-ODT) 8 MG disintegrating tablet Take 1 tablet (8 mg total) by mouth every 8 (eight) hours as needed for nausea or vomiting.  20 tablet  3  . pantoprazole (PROTONIX) 40 MG tablet TAKE 1 TABLET BY MOUTH TWICE A DAY WITH MEALS.  60 tablet  1  . zolpidem (AMBIEN) 5 MG tablet Take 1 tablet (5 mg total) by mouth at bedtime as needed for sleep.  30 tablet  0  . isosorbide mononitrate (IMDUR) 30 MG 24 hr tablet Take 1 tablet (30 mg total) by mouth daily.  30 tablet  11  . levothyroxine (SYNTHROID, LEVOTHROID) 150 MCG tablet TAKE 1 TABLET BY MOUTH ONCE DAILY.  90 tablet  1  . nitroGLYCERIN (NITROSTAT) 0.4 MG SL tablet Place 1 tablet (0.4 mg total) under the tongue every 5 (five) minutes as needed for chest pain.  25 tablet  3  . prochlorperazine (COMPAZINE) 10 MG tablet Take 1 tablet (10 mg total)  by mouth every 6 (six) hours as needed for nausea or vomiting.  60 tablet  0   No current facility-administered medications for this visit.   Facility-Administered Medications Ordered in Other Visits  Medication Dose Route Frequency Provider Last Rate Last Dose  . sodium chloride 0.9 % injection 10 mL  10 mL Intracatheter PRN Curt Bears, MD   10 mL at 01/05/13 1825    SURGICAL HISTORY:  Past Surgical History  Procedure Laterality Date  . History of port removal    . Status  post stem cell transplant on September 28, 2008.    Marland Kitchen Abdominal hysterectomy  1981  . Cholecystectomy  1971  . Mastectomy Left 2008  . Cataract extraction, bilateral      REVIEW OF SYSTEMS:  Constitutional: positive for fatigue and insomnia on chemotherapy days Eyes: negative Ears, nose, mouth, throat, and face: negative Respiratory: negative Cardiovascular: negative Gastrointestinal: positive for nausea Genitourinary:negative Integument/breast: negative Hematologic/lymphatic: negative Musculoskeletal:negative Neurological: negative Behavioral/Psych: negative Endocrine: negative Allergic/Immunologic: negative   PHYSICAL EXAMINATION: General appearance: alert, cooperative and no distress Head: Normocephalic, without obvious abnormality, atraumatic Neck: no adenopathy Lymph nodes: Cervical, supraclavicular, and axillary nodes normal. Resp: clear to auscultation bilaterally Back: symmetric, no curvature. ROM normal. No CVA tenderness. Cardio: regular rate and rhythm, S1, S2 normal, no murmur, click, rub or gallop GI: soft, non-tender; bowel sounds normal; no masses,  no organomegaly Extremities: extremities normal, atraumatic, no cyanosis or edema Neurologic: Alert and oriented X 3, normal strength and tone. Normal symmetric reflexes. Normal coordination and gait  ECOG PERFORMANCE STATUS: 1 - Symptomatic but completely ambulatory  Blood pressure 91/48, pulse 67, temperature 97.3 F (36.3 C),  temperature source Oral, resp. rate 18, height 5\' 5"  (1.651 m), weight 184 lb (83.462 kg), SpO2 99.00%.  LABORATORY DATA: Lab Results  Component Value Date   WBC 4.5 02/10/2013   HGB 11.1* 02/10/2013   HCT 33.9* 02/10/2013   MCV 93.4 02/10/2013   PLT 128* 02/10/2013      Chemistry      Component Value Date/Time   NA 137 02/03/2013 0902   NA 140 01/14/2013 0625   K 4.6 02/03/2013 0902   K 3.3* 01/14/2013 0625   CL 108 01/14/2013 0625   CL 105 03/19/2012 0811   CO2 21* 02/03/2013 0902   CO2 18* 01/14/2013 0625   BUN 37.0* 02/03/2013 0902   BUN 29* 01/14/2013 0625   CREATININE 2.9* 02/03/2013 0902   CREATININE 2.56* 01/14/2013 0625   CREATININE 3.15* 12/08/2012 1806      Component Value Date/Time   CALCIUM 9.8 02/03/2013 0902   CALCIUM 7.7* 01/14/2013 0625   ALKPHOS 149 02/03/2013 0902   ALKPHOS 149* 12/01/2012 1609   AST 23 02/03/2013 0902   AST 27 12/01/2012 1609   ALT 25 02/03/2013 0902   ALT 24 12/01/2012 1609   BILITOT 0.78 02/03/2013 0902   BILITOT 0.7 12/01/2012 1609       RADIOGRAPHIC STUDIES: Dg Chest 2 View  01/13/2013   CLINICAL DATA:  Shortness of breath.  EXAM: CHEST  2 VIEW  COMPARISON:  12/02/2012.  FINDINGS: Port-A-Cath in good anatomic position. Poor inspiration. No definite pulmonary infiltrate. No pleural effusion or pneumothorax. Borderline cardiomegaly. No acute bony abnormality. Surgical clips noted over the anterior chest.  IMPRESSION: 1. Port-A-Cath in good anatomic position. 2. Borderline cardiomegaly with mild pulmonary vascular prominence. No overt congestive heart failure.   Electronically Signed   By: 12/04/2012  Register   On: 01/13/2013 13:43   Nm Pulmonary Perf And Vent  01/14/2013   CLINICAL DATA:  History of myeloma. Acute shortness of breath, right-sided chest pain, chronic renal failure.  EXAM: NUCLEAR MEDICINE VENTILATION - PERFUSION LUNG SCAN  TECHNIQUE: Ventilation images were obtained in multiple projections using inhaled aerosol technetium 99 M  DTPA. Perfusion images were obtained in multiple projections after intravenous injection of Tc-59m MAA.  COMPARISON:  Chest x-ray 01/13/2013  RADIOPHARMACEUTICALS:  40 mCi Tc-99m DTPA aerosol and 6 mCi Tc-15m MAA  FINDINGS: Ventilation: No focal ventilation defect.  Perfusion: No  wedge shaped peripheral perfusion defects to suggest acute pulmonary embolism.  IMPRESSION: No evidence of pulmonary embolus.   Electronically Signed   By: Rolm Baptise M.D.   On: 01/14/2013 13:58   Ir Fluoro Guide Cv Line Right  01/01/2013   INDICATION: History of multiple myeloma, in need of intravenous access for chemotherapy administration  EXAM: IMPLANTED PORT A CATH PLACEMENT WITH ULTRASOUND AND FLUOROSCOPIC GUIDANCE  MEDICATIONS: Ancef 2 gm IV; IV antibiotic was given in an appropriate time interval prior to skin puncture.  ANESTHESIA/SEDATION: Versed 3 mg IV; Fentanyl 100 mcg IV;  Total Moderate Sedation Time  30  minutes.  CONTRAST:  None  COMPARISON:  None.  FLUOROSCOPY TIME:  30 seconds  PROCEDURE: The procedure, risks, benefits, and alternatives were explained to the patient. Questions regarding the procedure were encouraged and answered. The patient understands and consents to the procedure.  The right neck and chest were prepped with chlorhexidine in a sterile fashion, and a sterile drape was applied covering the operative field. Maximum barrier sterile technique with sterile gowns and gloves were used for the procedure. A timeout was performed prior to the initiation of the procedure. Local anesthesia was provided with 1% lidocaine with epinephrine.  After creating a small venotomy incision, a micropuncture kit was utilized to access the internal jugular vein under direct, real-time ultrasound guidance. Ultrasound image documentation was performed. The microwire was kinked to measure appropriate catheter length.  A subcutaneous port pocket was then created along the upper chest wall (at the site of the previous right  anterior chest wall port a catheter) utilizing a combination of sharp and blunt dissection. The pocket was irrigated with sterile saline. A single lumen ISP power injectable port was chosen for placement. The 8 Fr catheter was tunneled from the port pocket site to the venotomy incision. The port was placed in the pocket. The external catheter was trimmed to appropriate length. At the venotomy, an 8 Fr peel-away sheath was placed over a guidewire under fluoroscopic guidance. The catheter was then placed through the sheath and the sheath was removed. Final catheter positioning was confirmed and documented with a fluoroscopic spot radiograph. The port was accessed with a Huber needle, aspirated and flushed with heparinized saline.  The venotomy site was closed with an interrupted 4-0 Vicryl suture. The port pocket incision was closed with interrupted 2-0 Vicryl suture and the skin was opposed with a running subcuticular 4-0 Vicryl suture. Dermabond and Steri-strips were applied to both incisions. Dressings were placed. The patient tolerated the procedure well without immediate post procedural complication.  COMPLICATIONS: None immediate  FINDINGS: After catheter placement, the tip lies within the superior cavoatrial junction. The catheter aspirates and flushes normally and is ready for immediate use.  IMPRESSION: Successful placement of a right internal jugular approach power injectable Port-A-Cath. The catheter is ready for immediate use.   Electronically Signed   By: Sandi Mariscal M.D.   On: 01/01/2013 16:35   Ir US Guide Vasc Access Right  01/01/2013   INDICATION: History of multiple myeloma, in need of intravenous access for chemotherapy administration  EXAM: IMPLANTED PORT A CATH PLACEMENT WITH ULTRASOUND AND FLUOROSCOPIC GUIDANCE  MEDICATIONS: Ancef 2 gm IV; IV antibiotic was given in an appropriate time interval prior to skin puncture.  ANESTHESIA/SEDATION: Versed 3 mg IV; Fentanyl 100 mcg IV;  Total Moderate  Sedation Time  30  minutes.  CONTRAST:  None  COMPARISON:  None.  FLUOROSCOPY TIME:  30 seconds  PROCEDURE: The procedure,  risks, benefits, and alternatives were explained to the patient. Questions regarding the procedure were encouraged and answered. The patient understands and consents to the procedure.  The right neck and chest were prepped with chlorhexidine in a sterile fashion, and a sterile drape was applied covering the operative field. Maximum barrier sterile technique with sterile gowns and gloves were used for the procedure. A timeout was performed prior to the initiation of the procedure. Local anesthesia was provided with 1% lidocaine with epinephrine.  After creating a small venotomy incision, a micropuncture kit was utilized to access the internal jugular vein under direct, real-time ultrasound guidance. Ultrasound image documentation was performed. The microwire was kinked to measure appropriate catheter length.  A subcutaneous port pocket was then created along the upper chest wall (at the site of the previous right anterior chest wall port a catheter) utilizing a combination of sharp and blunt dissection. The pocket was irrigated with sterile saline. A single lumen ISP power injectable port was chosen for placement. The 8 Fr catheter was tunneled from the port pocket site to the venotomy incision. The port was placed in the pocket. The external catheter was trimmed to appropriate length. At the venotomy, an 8 Fr peel-away sheath was placed over a guidewire under fluoroscopic guidance. The catheter was then placed through the sheath and the sheath was removed. Final catheter positioning was confirmed and documented with a fluoroscopic spot radiograph. The port was accessed with a Huber needle, aspirated and flushed with heparinized saline.  The venotomy site was closed with an interrupted 4-0 Vicryl suture. The port pocket incision was closed with interrupted 2-0 Vicryl suture and the skin was  opposed with a running subcuticular 4-0 Vicryl suture. Dermabond and Steri-strips were applied to both incisions. Dressings were placed. The patient tolerated the procedure well without immediate post procedural complication.  COMPLICATIONS: None immediate  FINDINGS: After catheter placement, the tip lies within the superior cavoatrial junction. The catheter aspirates and flushes normally and is ready for immediate use.  IMPRESSION: Successful placement of a right internal jugular approach power injectable Port-A-Cath. The catheter is ready for immediate use.   Electronically Signed   By: Sandi Mariscal M.D.   On: 01/01/2013 16:35   BONE MARROW REPORT FINAL DIAGNOSIS Diagnosis Bone Marrow, Aspirate,Biopsy, and Clot, left iliac - HYPERCELLULAR MARROW WITH MONOCLONAL PLASMACYTOSIS (10%). - SEE COMMENT. PERIPHERAL BLOOD: - NORMOCYTIC ANEMIA. Diagnosis Note The bone marrow is hypercellular with increased plasma cells (10% overall) with frequent small clusters. The plasma cells show a kappa predominance by immunohistochemistry. These findings are consistent with persistence/recurrence of the patient's prior plasma cell neoplasm. Vicente Males MD Pathologist, Electronic Signature (Case signed 12/19/2012)  ASSESSMENT AND PLAN: This is a very pleasant 73 years old white female with recurrent multiple myeloma as well as history of ductal carcinoma in situ. Her last myeloma panel in addition to the bone marrow biopsy and aspirate showed recurrence of her disease. She is currently being treated with systemic therapy with Carfilzomib, Cytoxan and Decadron. Overall she's tolerating the chemotherapy without difficulty. Patient was discussed with also seen by Dr. Julien Nordmann.  Of note the patient's 2-D echo prior to initiating her systemic chemotherapy revealed an ejection fraction between 60-65%. A long discussion with the patient regarding her complaints included completing cycle #2 of her systemic chemotherapy and  obtaining restaging protein studies. Patient is in agreement with this plan and will return in 2 weeks to discuss the results of the restaging protein studies and pending those results  we'll make a decision as to whether to continue on further treatment. Regarding her low blood pressure today is likely related to her decreased by mouth intake both of food and fluids. Patient is encouraged to increase her by mouth intake both of food and fluids. Regarding her depression, she is strongly advised to followup with her primary care physician. It is likely that she may need her antidepressant changed and/or the regimen amended. For the renal insufficiency, she will continue followup with [facility] as scheduled. The patient is encouraged to keep her appointments with pulmonology as well as cardiology.   She was advised to call immediately if she has any concerning symptoms in the interval. All questions were answered. The patient knows to call the clinic with any problems, questions or concerns. We can certainly see the patient much sooner if necessary.  Carlton Adam PA-C  ADDENDUM: Hematology/Oncology Attending: I had the face to face encounter with the patient. I recommended her care plan. This is a very pleasant 73 years old white female with recurrent multiple myeloma currently on treatment with Carfilzomib, Cytoxan and Decadron status post 1 cycle and she is currently undergoing cycle #2. The patient is tolerating her treatment fairly well except for increasing fatigue and weakness. She has improvement in her renal function after she started the chemotherapy with this regimen. She is very tired and depressed and she would like to discontinue her treatment with chemotherapy after this cycle. I had a lengthy discussion with the patient today about her condition. I strongly recommended for her to finish cycle #2 as scheduled. She would have repeat myeloma panel before her next visit in  2 weeks. If the patient has benefit from this treatment with improvement in her myeloma panel I would recommend for her to continue with further treatment especially with the improvement in her renal function, but the patient would have the final decision whether to continue his treatment or not. She agreed to the current plan. She was advised to call immediately if she has any concerning symptoms in the interval.  Disclaimer: This note was dictated with voice recognition software. Similar sounding words can inadvertently be transcribed and may not be corrected upon review.  [name]., MD 02/10/2013

## 2013-02-10 NOTE — Telephone Encounter (Signed)
[facility] APPT SCHEDULE FOR FEB.MARCH

## 2013-02-10 NOTE — Patient Instructions (Signed)
[place] Discharge Instructions for Patients Receiving Chemotherapy  Today you received the following chemotherapy agents: Cytoxan and Kyprolis  You also received Zometa today.  To help prevent nausea and vomiting after your treatment, we encourage you to take your nausea medication as prescribed.   If you develop nausea and vomiting that is not controlled by your nausea medication, call the clinic.   BELOW ARE SYMPTOMS THAT SHOULD BE REPORTED IMMEDIATELY:  *FEVER GREATER THAN 100.5 F  *CHILLS WITH OR WITHOUT FEVER  NAUSEA AND VOMITING THAT IS NOT CONTROLLED WITH YOUR NAUSEA MEDICATION  *UNUSUAL SHORTNESS OF BREATH  *UNUSUAL BRUISING OR BLEEDING  TENDERNESS IN MOUTH AND THROAT WITH OR WITHOUT PRESENCE OF ULCERS  *URINARY PROBLEMS  *BOWEL PROBLEMS  UNUSUAL RASH Items with * indicate a potential emergency and should be followed up as soon as possible.  Feel free to call the clinic you have any questions or concerns. The clinic phone number is [phone].

## 2013-02-10 NOTE — Patient Instructions (Signed)
Continue labs and chemotherapy as scheduled Followup with your primary care physician regarding your depression Keep your upcoming appointments with your pulmonologist and cardiologist Followup with Dr. Julien Nordmann in 2 weeks with restaging protein studies to reevaluate her disease

## 2013-02-11 ENCOUNTER — Ambulatory Visit (HOSPITAL_BASED_OUTPATIENT_CLINIC_OR_DEPARTMENT_OTHER): Payer: Medicare Other

## 2013-02-11 VITALS — BP 113/46 | HR 77 | Temp 98.2°F | Resp 18

## 2013-02-11 DIAGNOSIS — Z5112 Encounter for antineoplastic immunotherapy: Secondary | ICD-10-CM

## 2013-02-11 DIAGNOSIS — C9002 Multiple myeloma in relapse: Secondary | ICD-10-CM

## 2013-02-11 MED ORDER — ONDANSETRON 8 MG/NS 50 ML IVPB
INTRAVENOUS | Status: AC
  Filled 2013-02-11: qty 8

## 2013-02-11 MED ORDER — ONDANSETRON 8 MG/50ML IVPB (CHCC)
8.0000 mg | Freq: Once | INTRAVENOUS | Status: AC
  Administered 2013-02-11: 8 mg via INTRAVENOUS

## 2013-02-11 MED ORDER — SODIUM CHLORIDE 0.9 % IV SOLN
Freq: Once | INTRAVENOUS | Status: AC
  Administered 2013-02-11: 10:00:00 via INTRAVENOUS

## 2013-02-11 MED ORDER — HEPARIN SOD (PORK) LOCK FLUSH 100 UNIT/ML IV SOLN
500.0000 [IU] | Freq: Once | INTRAVENOUS | Status: AC | PRN
  Administered 2013-02-11: 500 [IU]
  Filled 2013-02-11: qty 5

## 2013-02-11 MED ORDER — DEXAMETHASONE SODIUM PHOSPHATE 10 MG/ML IJ SOLN
10.0000 mg | Freq: Once | INTRAMUSCULAR | Status: AC
  Administered 2013-02-11: 10 mg via INTRAVENOUS

## 2013-02-11 MED ORDER — SODIUM CHLORIDE 0.9 % IJ SOLN
10.0000 mL | INTRAMUSCULAR | Status: DC | PRN
  Administered 2013-02-11: 10 mL
  Filled 2013-02-11: qty 10

## 2013-02-11 MED ORDER — DEXTROSE 5 % IV SOLN
36.0000 mg/m2 | Freq: Once | INTRAVENOUS | Status: AC
  Administered 2013-02-11: 70 mg via INTRAVENOUS
  Filled 2013-02-11: qty 35

## 2013-02-11 MED ORDER — DEXAMETHASONE SODIUM PHOSPHATE 10 MG/ML IJ SOLN
INTRAMUSCULAR | Status: AC
  Filled 2013-02-11: qty 1

## 2013-02-11 NOTE — Patient Instructions (Signed)
[facility] Discharge Instructions for Patients Receiving Chemotherapy  Today you received the following chemotherapy agents :  Kyprolis.  To help prevent nausea and vomiting after your treatment, we encourage you to take your nausea medication as prescribed by your physician.   If you develop nausea and vomiting that is not controlled by your nausea medication, call the clinic.   BELOW ARE SYMPTOMS THAT SHOULD BE REPORTED IMMEDIATELY:  *FEVER GREATER THAN 100.5 F  *CHILLS WITH OR WITHOUT FEVER  NAUSEA AND VOMITING THAT IS NOT CONTROLLED WITH YOUR NAUSEA MEDICATION  *UNUSUAL SHORTNESS OF BREATH  *UNUSUAL BRUISING OR BLEEDING  TENDERNESS IN MOUTH AND THROAT WITH OR WITHOUT PRESENCE OF ULCERS  *URINARY PROBLEMS  *BOWEL PROBLEMS  UNUSUAL RASH Items with * indicate a potential emergency and should be followed up as soon as possible.  Feel free to call the clinic you have any questions or concerns. The clinic phone number is [phone].

## 2013-02-12 ENCOUNTER — Other Ambulatory Visit: Payer: Medicare Other

## 2013-02-12 ENCOUNTER — Ambulatory Visit (INDEPENDENT_AMBULATORY_CARE_PROVIDER_SITE_OTHER): Payer: Medicare Other | Admitting: Internal Medicine

## 2013-02-12 ENCOUNTER — Telehealth: Payer: Self-pay | Admitting: *Deleted

## 2013-02-12 DIAGNOSIS — R0609 Other forms of dyspnea: Secondary | ICD-10-CM

## 2013-02-12 DIAGNOSIS — R0989 Other specified symptoms and signs involving the circulatory and respiratory systems: Secondary | ICD-10-CM

## 2013-02-12 LAB — PULMONARY FUNCTION TEST
DL/VA % PRED: 50 %
DL/VA: 2.51 ml/min/mmHg/L
DLCO unc % pred: 42 %
DLCO unc: 11 ml/min/mmHg
FEF 25-75 Post: 0.97 L/sec
FEF 25-75 Pre: 0.85 L/sec
FEF2575-%Change-Post: 14 %
FEF2575-%PRED-POST: 50 %
FEF2575-%Pred-Pre: 44 %
FEV1-%Change-Post: 4 %
FEV1-%PRED-POST: 66 %
FEV1-%Pred-Pre: 63 %
FEV1-Post: 1.56 L
FEV1-Pre: 1.49 L
FEV1FVC-%Change-Post: 0 %
FEV1FVC-%Pred-Pre: 88 %
FEV6-%Change-Post: 5 %
FEV6-%PRED-PRE: 73 %
FEV6-%Pred-Post: 77 %
FEV6-POST: 2.29 L
FEV6-PRE: 2.18 L
FEV6FVC-%CHANGE-POST: 0 %
FEV6FVC-%PRED-POST: 104 %
FEV6FVC-%Pred-Pre: 103 %
FVC-%Change-Post: 4 %
FVC-%PRED-PRE: 71 %
FVC-%Pred-Post: 75 %
FVC-PRE: 2.22 L
FVC-Post: 2.33 L
Post FEV1/FVC ratio: 67 %
Post FEV6/FVC ratio: 99 %
Pre FEV1/FVC ratio: 67 %
Pre FEV6/FVC Ratio: 98 %

## 2013-02-12 NOTE — Telephone Encounter (Signed)
Spoke with patient regarding letter from insurance stating they cover Pantoprazole 30 tabs/30 days.  Per Dr [name], try taking med 1 time a day and can evaluate at 02/2013 CPE.

## 2013-02-12 NOTE — Progress Notes (Signed)
PFT done today. 

## 2013-02-18 ENCOUNTER — Ambulatory Visit (HOSPITAL_BASED_OUTPATIENT_CLINIC_OR_DEPARTMENT_OTHER): Payer: Medicare Other

## 2013-02-18 ENCOUNTER — Other Ambulatory Visit (HOSPITAL_BASED_OUTPATIENT_CLINIC_OR_DEPARTMENT_OTHER): Payer: Medicare Other

## 2013-02-18 VITALS — BP 125/61 | HR 76 | Temp 99.3°F

## 2013-02-18 DIAGNOSIS — C9002 Multiple myeloma in relapse: Secondary | ICD-10-CM

## 2013-02-18 DIAGNOSIS — Z95828 Presence of other vascular implants and grafts: Secondary | ICD-10-CM

## 2013-02-18 DIAGNOSIS — Z452 Encounter for adjustment and management of vascular access device: Secondary | ICD-10-CM

## 2013-02-18 LAB — CBC WITH DIFFERENTIAL/PLATELET
BASO%: 1.7 % (ref 0.0–2.0)
Basophils Absolute: 0.1 10*3/uL (ref 0.0–0.1)
EOS%: 5.3 % (ref 0.0–7.0)
Eosinophils Absolute: 0.2 10*3/uL (ref 0.0–0.5)
HCT: 27.3 % — ABNORMAL LOW (ref 34.8–46.6)
HGB: 9 g/dL — ABNORMAL LOW (ref 11.6–15.9)
LYMPH#: 1.5 10*3/uL (ref 0.9–3.3)
LYMPH%: 33.2 % (ref 14.0–49.7)
MCH: 31.6 pg (ref 25.1–34.0)
MCHC: 33.1 g/dL (ref 31.5–36.0)
MCV: 95.3 fL (ref 79.5–101.0)
MONO#: 0.8 10*3/uL (ref 0.1–0.9)
MONO%: 17.8 % — ABNORMAL HIGH (ref 0.0–14.0)
NEUT%: 42 % (ref 38.4–76.8)
NEUTROS ABS: 2 10*3/uL (ref 1.5–6.5)
Platelets: 206 10*3/uL (ref 145–400)
RBC: 2.87 10*6/uL — ABNORMAL LOW (ref 3.70–5.45)
RDW: 14.8 % — ABNORMAL HIGH (ref 11.2–14.5)
WBC: 4.7 10*3/uL (ref 3.9–10.3)

## 2013-02-18 LAB — COMPREHENSIVE METABOLIC PANEL (CC13)
ALK PHOS: 173 U/L — AB (ref 40–150)
ALT: 46 U/L (ref 0–55)
AST: 25 U/L (ref 5–34)
Albumin: 3.5 g/dL (ref 3.5–5.0)
Anion Gap: 11 mEq/L (ref 3–11)
BILIRUBIN TOTAL: 0.93 mg/dL (ref 0.20–1.20)
BUN: 45.1 mg/dL — AB (ref 7.0–26.0)
CO2: 23 mEq/L (ref 22–29)
CREATININE: 3 mg/dL — AB (ref 0.6–1.1)
Calcium: 9.5 mg/dL (ref 8.4–10.4)
Chloride: 106 mEq/L (ref 98–109)
Glucose: 126 mg/dl (ref 70–140)
POTASSIUM: 3.8 meq/L (ref 3.5–5.1)
Sodium: 141 mEq/L (ref 136–145)
Total Protein: 6.2 g/dL — ABNORMAL LOW (ref 6.4–8.3)

## 2013-02-18 LAB — LACTATE DEHYDROGENASE (CC13): LDH: 251 U/L — ABNORMAL HIGH (ref 125–245)

## 2013-02-18 MED ORDER — SODIUM CHLORIDE 0.9 % IJ SOLN
10.0000 mL | INTRAMUSCULAR | Status: DC | PRN
  Administered 2013-02-18: 10 mL via INTRAVENOUS
  Filled 2013-02-18: qty 10

## 2013-02-18 MED ORDER — HEPARIN SOD (PORK) LOCK FLUSH 100 UNIT/ML IV SOLN
500.0000 [IU] | Freq: Once | INTRAVENOUS | Status: AC
  Administered 2013-02-18: 500 [IU] via INTRAVENOUS
  Filled 2013-02-18: qty 5

## 2013-02-18 NOTE — Progress Notes (Signed)
Patient in for Port-A-Cath flush today. Port accessed and flushed without any difficulty. No blood return noted during port flush today. Verified no blood return noted with Jac Canavan, RN, BSN. All labs drawn from left arm vein by Jac Canavan, RN, BSN. Patient tolerated flush and lab draws without any complaints.

## 2013-02-18 NOTE — Patient Instructions (Signed)

## 2013-02-19 ENCOUNTER — Ambulatory Visit (HOSPITAL_COMMUNITY): Payer: Medicare Other | Attending: Physician Assistant | Admitting: Radiology

## 2013-02-19 DIAGNOSIS — Z853 Personal history of malignant neoplasm of breast: Secondary | ICD-10-CM | POA: Insufficient documentation

## 2013-02-19 DIAGNOSIS — C9000 Multiple myeloma not having achieved remission: Secondary | ICD-10-CM | POA: Insufficient documentation

## 2013-02-19 DIAGNOSIS — R079 Chest pain, unspecified: Secondary | ICD-10-CM | POA: Insufficient documentation

## 2013-02-19 DIAGNOSIS — I319 Disease of pericardium, unspecified: Secondary | ICD-10-CM | POA: Insufficient documentation

## 2013-02-19 DIAGNOSIS — R0602 Shortness of breath: Secondary | ICD-10-CM | POA: Insufficient documentation

## 2013-02-19 DIAGNOSIS — N189 Chronic kidney disease, unspecified: Secondary | ICD-10-CM | POA: Insufficient documentation

## 2013-02-19 DIAGNOSIS — I129 Hypertensive chronic kidney disease with stage 1 through stage 4 chronic kidney disease, or unspecified chronic kidney disease: Secondary | ICD-10-CM | POA: Insufficient documentation

## 2013-02-19 NOTE — Progress Notes (Signed)
Limited Echocardiogram performed.  

## 2013-02-20 ENCOUNTER — Encounter: Payer: Self-pay | Admitting: Internal Medicine

## 2013-02-20 ENCOUNTER — Ambulatory Visit (INDEPENDENT_AMBULATORY_CARE_PROVIDER_SITE_OTHER): Payer: Medicare Other | Admitting: Internal Medicine

## 2013-02-20 ENCOUNTER — Telehealth: Payer: Self-pay | Admitting: *Deleted

## 2013-02-20 ENCOUNTER — Ambulatory Visit (INDEPENDENT_AMBULATORY_CARE_PROVIDER_SITE_OTHER)
Admission: RE | Admit: 2013-02-20 | Discharge: 2013-02-20 | Disposition: A | Discharge status: Home / Self Care | Payer: Medicare Other | Source: Ambulatory Visit | Attending: Internal Medicine | Admitting: Internal Medicine

## 2013-02-20 VITALS — BP 122/76 | HR 79 | Ht 65.5 in | Wt 184.0 lb

## 2013-02-20 DIAGNOSIS — D649 Anemia, unspecified: Secondary | ICD-10-CM

## 2013-02-20 DIAGNOSIS — Z853 Personal history of malignant neoplasm of breast: Secondary | ICD-10-CM

## 2013-02-20 DIAGNOSIS — M546 Pain in thoracic spine: Secondary | ICD-10-CM

## 2013-02-20 DIAGNOSIS — J439 Emphysema, unspecified: Secondary | ICD-10-CM | POA: Insufficient documentation

## 2013-02-20 DIAGNOSIS — J438 Other emphysema: Secondary | ICD-10-CM

## 2013-02-20 DIAGNOSIS — M549 Dorsalgia, unspecified: Secondary | ICD-10-CM

## 2013-02-20 DIAGNOSIS — C9002 Multiple myeloma in relapse: Secondary | ICD-10-CM

## 2013-02-20 DIAGNOSIS — M545 Low back pain, unspecified: Secondary | ICD-10-CM

## 2013-02-20 LAB — IGG, IGA, IGM
IGM, SERUM: 17 mg/dL — AB (ref 52–322)
IgA: 14 mg/dL — ABNORMAL LOW (ref 69–380)
IgG (Immunoglobin G), Serum: 622 mg/dL — ABNORMAL LOW (ref 690–1700)

## 2013-02-20 LAB — KAPPA/LAMBDA LIGHT CHAINS
KAPPA FREE LGHT CHN: 1.6 mg/dL (ref 0.33–1.94)
KAPPA LAMBDA RATIO: 8.42 — AB (ref 0.26–1.65)
Lambda Free Lght Chn: 0.19 mg/dL — ABNORMAL LOW (ref 0.57–2.63)

## 2013-02-20 LAB — BETA 2 MICROGLOBULIN, SERUM: BETA 2 MICROGLOBULIN: 7.71 mg/L — AB (ref 1.01–1.73)

## 2013-02-20 NOTE — Patient Instructions (Addendum)
Exercise walk for oximetry on room air--dx COPD, dyspnea- done  Order- Xray thoracic spine  Dx back pain, hx breast Ca, hx myeloma  Sample Anoro inhaler for trial     Inhale one puff, once daily  Please call as needed

## 2013-02-20 NOTE — Telephone Encounter (Signed)
lmptcb for echo results 

## 2013-02-20 NOTE — Assessment & Plan Note (Signed)
Moderate COPD but no demonstrated oxygen desaturation with the kind of exertion but she says makes her short of breath at home. Echocardiogram did not suggest a cardiac basis for dyspnea. She does not notice response to her rescue inhaler but we will try a maintenance controller. Plan- sample Anoro inhaler for trial. If this does not help her  dyspnea at all,  then I can only encourage regular walking to maintain stamina.

## 2013-02-20 NOTE — Assessment & Plan Note (Signed)
Watch for significant anemia-this could aggravate dyspnea on exertion

## 2013-02-20 NOTE — Progress Notes (Signed)
01/20/13- 20 yoF former smoker (30 pk yrs) referred courtesy of Dr Julien Nordmann for dyspnea, asking PFT incl DLCO.  Hx Multiple Myeloma in relapse, recent acute hosp summarized below.  She quit smoking in 2007 and had been diagnosed with COPD without formal testing. She has a nebulizer machine at home but has not used it in a long time.  Abrupt onset of shortness of breath "nothing goes in" as she was walking away from a chemotherapy treatment on December 30.had shortness of breath and midsternal chest pain. Used rescue inhaler with no effect. Now has chest pain on exertion "like spasm" indicating midsternal level of her chest with radiation to angles of the jaw.  Self-limited pain fades in a few minutes without palpitation or sweat. Having this pain daily. Associates with effort by climbing stairs or stocking a pantry where she has to raise her arms. No routine cough or wheeze. No history of heart disease.  Significant history of reflux for which she takes proton X. Twice daily.  Cramping spasms in right hand. Calf pain left greater than right, described as cramps.  Doppler leg veins negative 01/13/2013  2D Echo 12/26/12  Study Conclusions Left ventricle: The cavity size was normal. Wall thickness was normal. Systolic function was normal. The estimated ejection fraction was in the range of 60% to 65%. Doppler parameters are consistent with abnormal left ventricular relaxation (grade 1 diastolic dysfunction). Transthoracic echocardiography. M-mode, complete 2D, spectral Doppler, and color Doppler. Height: Height: 165.1cm. Height: 65in. Weight: Weight: 81.2kg. Weight: 178.6lb. Body mass index: BMI: 29.8kg/m^2. Body surface area: BSA: 1.70m2. Blood pressure: 131/67. Patient status: Outpatient. Location: Echo laboratory.  CXR 01/13/13  IMPRESSION:  1. Port-A-Cath in good anatomic position.  2. Borderline cardiomegaly with mild pulmonary vascular prominence.  No overt congestive heart  failure.  Electronically Signed  By: TMarcello MooresRegister  On: 01/13/2013 13:43  V/Q scan 01/13/13  IMPRESSION:  No evidence of pulmonary embolus.  Electronically Signed  By: KRolm BaptiseM.D.  On: 01/14/2013 13:58  Recent Hosp:  Admit date: 01/13/2013  Discharge date: 01/15/2013  Time spent:445mutes  Recommendations for Outpatient Follow-up:  Diastolic CHF  -Although patient's CXR did not support fluid overload patient still experiencing some SOB, it has gotten a large fluid load with PRBC x2 units and IV fluids overnight. Treated Lasix 40 mg x1  -ProBNP= 3915 upon admission  -Patient on low-dose Coreg, and will continue low-dose Lasix on discharge  -Patient will need to followup with PCP to recheck renal function, and need for titration of Coreg/Lasix  Asthma  -Will restart patient's home medication regimen even though patient has never had spirometry.  -[facility] spirometry pre-/post bronchodilator, DLCO *  -Ambulatory SpO2 on room air O2 sat between 90 and 92.  SOB  -See asthma  Hypothyroid  -Continue home medication  Multiple myeloma  Per Dr. MoInda Merlinoncology) patient received below chemotherapy treatment  PRIOR THERAPY:  1. Status post treatment with tamoxifen from November 2008 through February 2009, discontinued secondary to intolerance. 2. Status post 3 cycles of chemotherapy with Revlimid and Decadron followed by 1 cycle of Decadron only with mild response. 3. Status post 2 cycles of systemic chemotherapy with Velcade, Doxil and Decadron discontinued secondary to significant peripheral neuropathy. [facility] Status post [facility] of Dr. NiPhyllis GingerCURRENT THERAPY: Systemic chemotherapy with  Carfilzomib initially was 20 mg/M2 and will be increased after cycle #1 to 36 mg/M2 on days 1, 2, 8, 9, 15  and 16 every 4 weeks in addition to Cytoxan 300 mg/M2 and Decadron 40 mg by mouth weekly basis. First cycle started on 12/29/2012.  -Patient to followup with Dr. Inda Merlin (oncologist) next week for second cycle of chemotherapy  Anemia  -Secondary to above chemotherapy, H./H. stable today  HTN  -Continue Coreg  Acute /chronic renal failure  -Patient will need to continue to monitor her renal function with her PCP to ensure does not worsen secondary to chemotherapy or other medications    02/20/13- 72 yoF former smoker (30 pk yrs) referred courtesy of Dr Julien Nordmann for dyspnea, asking PFT incl DLCO. Hx Multiple Myeloma in relapse, hx breast Ca, diastolic dysfunction/ CHF, anemia, HTN, GERD, CKD FOLLOWS FOR: States still haivng chest pain and sob  Pain in mid upper spine through to sternum after pulling grocery cart. The same pain has been present intermittently, probably for much of the past year, but somewhat worse since this episode with a cart. She is pending a cardiac stress test and had an echo. She will see Dr.Nishan / Card next week. Rescue inhaler does little. Some wheeze is routine, but not cough. She feels need to take a deep breath. ECHO- 02/19/13- EF 55-60%, normal motion, normal valves and pressures PFT- 02/12/2013-moderate obstructive airways disease, insignificant response to bronchodilator, mild restriction, severe diffusion reduction. FVC 2.33/75%, FEV1 1.56/66%, FEV1/FVC 0.67, FEF 25-75% 0.97/50%, TLC 78%, DLCO 42%. Walk test 02/20/13-- 3 laps x185 feet, peak heart rate 89, lowest oxygen saturation 98% on room air.   ROS-see HPI Constitutional:   No-   weight loss, night sweats, fevers, chills, fatigue, lassitude. HEENT:   No-  headaches, difficulty swallowing, tooth/dental problems, sore throat,       No-  sneezing, itching, ear ache, nasal congestion, post nasal drip,  CV:  +chest pain, no-orthopnea, PND, swelling in lower extremities, anasarca,                                   dizziness, palpitations Resp: +shortness of breath with exertion or at rest.              No-   productive cough,  No non-productive cough,  No- coughing up of blood.              No-   change in color of mucus.  + wheezing.   Skin: No-   rash or lesions. GI:  No-   heartburn, indigestion, abdominal pain, nausea, vomiting, GU:  MS:  No-   joint pain or swelling.  .  + back pain. Neuro-     nothing unusual Psych:  No- change in mood or affect. No depression or anxiety.  No memory loss.  OBJ- Physical Exam General- Alert, Oriented, Affect-appropriate, Distress- none acute Skin- rash-none, lesions- none, excoriation- none Lymphadenopathy- none Head- atraumatic            Eyes- Gross vision intact, PERRLA, conjunctivae and secretions clear            Ears- Hearing, canals-normal            Nose- Clear, no-Septal dev, mucus, polyps, erosion, perforation             Throat- Mallampati II , mucosa clear , drainage- none, tonsils- atrophic Neck- flexible , trachea midline,  no stridor , thyroid nl, carotid no bruit Chest - symmetrical excursion , unlabored           Heart/CV- RRR , no murmur , no gallop  , no rub, nl s1 s2                           - JVD- none , edema- none, stasis changes- none, varices- none           Lung- clear to P&A, wheeze- none, cough- none , dullness-none, rub- none           Chest wall- + R Port-A-Cath, + left breast reconstruction and prosthesis                               + localized tenderness to light fist shock midthoracic spine Abd-  Br/ Gen/ Rectal- Not done, not indicated Extrem- cyanosis- none, clubbing, none, atrophy- none, strength- nl Neuro- grossly intact to observation

## 2013-02-20 NOTE — Assessment & Plan Note (Signed)
History of myeloma and of breast cancer. Question bone metastasis Plan-x-rays thoracicspine

## 2013-02-20 NOTE — Telephone Encounter (Signed)
pt notified about echo results with verbal understanding to results

## 2013-02-23 ENCOUNTER — Ambulatory Visit (HOSPITAL_COMMUNITY): Payer: Medicare Other | Attending: Physician Assistant | Admitting: Radiology

## 2013-02-23 VITALS — BP 123/68 | HR 65 | Ht 65.0 in | Wt 183.0 lb

## 2013-02-23 DIAGNOSIS — R0609 Other forms of dyspnea: Secondary | ICD-10-CM | POA: Insufficient documentation

## 2013-02-23 DIAGNOSIS — I10 Essential (primary) hypertension: Secondary | ICD-10-CM | POA: Insufficient documentation

## 2013-02-23 DIAGNOSIS — Z87891 Personal history of nicotine dependence: Secondary | ICD-10-CM | POA: Insufficient documentation

## 2013-02-23 DIAGNOSIS — R079 Chest pain, unspecified: Secondary | ICD-10-CM | POA: Insufficient documentation

## 2013-02-23 DIAGNOSIS — R0989 Other specified symptoms and signs involving the circulatory and respiratory systems: Secondary | ICD-10-CM | POA: Insufficient documentation

## 2013-02-23 MED ORDER — TECHNETIUM TC 99M SESTAMIBI GENERIC - CARDIOLITE
10.8000 | Freq: Once | INTRAVENOUS | Status: AC | PRN
  Administered 2013-02-23: 11 via INTRAVENOUS

## 2013-02-23 MED ORDER — TECHNETIUM TC 99M SESTAMIBI GENERIC - CARDIOLITE
33.0000 | Freq: Once | INTRAVENOUS | Status: AC | PRN
  Administered 2013-02-23: 33 via INTRAVENOUS

## 2013-02-23 MED ORDER — REGADENOSON 0.4 MG/5ML IV SOLN
0.4000 mg | Freq: Once | INTRAVENOUS | Status: AC
  Administered 2013-02-23: 0.4 mg via INTRAVENOUS

## 2013-02-23 NOTE — Progress Notes (Signed)
  [facility] [address] [place], [place] [address] [phone]    Cardiology Nuclear Med Study  Nancy Norris is a 73 y.o. female     MRN : [id number]     DOB: 11/29/1940  Procedure Date: 02/23/2013  Nuclear Med Background Indication for Stress Test:  Evaluation for Ischemia History:  No known CAD, MI per echo, Echo 2014 EF 60-65%, COPD, 3 months of chemo Cardiac Risk Factors: History of Smoking and Hypertension  Symptoms:  Chest Pain and Chest Pain with Exertion   Nuclear Pre-Procedure Caffeine/Decaff Intake:  None NPO After: 7:00pm   Lungs:  clear O2 Sat: 98% on room air. IV 0.9% NS with Angio Cath:  22g  IV Site: R Hand  IV Started by:  Matilde Haymaker, RN  Chest Size (in):  40 Cup Size: D  Height: 5\' 5"  (1.651 m)  Weight:  183 lb (83.008 kg)  BMI:  Body mass index is 30.45 kg/(m^2). Tech Comments:  No Coreg since 1/15    Nuclear Med Study 1 or 2 day study: 1 day  Stress Test Type:  Treadmill/Lexiscan  Reading MD: n/a  Order Authorizing Provider:  Trude Mcburney and Chelsea Primus  Resting Radionuclide: Technetium 30m Sestamibi  Resting Radionuclide Dose: 11.0 mCi   Stress Radionuclide:  Technetium 51m Sestamibi  Stress Radionuclide Dose: 33.0 mCi           Stress Protocol Rest HR: 65 Stress HR: 98  Rest BP: 123/68 Stress BP: 109/46  Exercise Time (min): n/a METS: n/a           Dose of Adenosine (mg):  n/a Dose of Lexiscan: 0.4 mg  Dose of Atropine (mg): n/a Dose of Dobutamine: n/a mcg/kg/min (at max HR)  Stress Test Technologist: Glade Lloyd, BS-ES  Nuclear Technologist:  Charlton Amor, CNMT     Rest Procedure:  Myocardial perfusion imaging was performed at rest 45 minutes following the intravenous administration of Technetium 50m Sestamibi. Rest ECG: NSR - Normal EKG  Stress Procedure:  The patient received IV Lexiscan 0.4 mg over 15-seconds with concurrent low level exercise and then Technetium 63m Sestamibi was  injected at 30-seconds while the patient continued walking one more minute.  Quantitative spect images were obtained after a 45-minute delay.  During the infusion of Lexiscan, the patient complained of being very SOB and head feeling tight.  These symptoms began to resolve in recovery.  Stress ECG: No significant change from baseline ECG  QPS Raw Data Images:  Normal; no motion artifact; normal heart/lung ratio. Stress Images:  Normal homogeneous uptake in all areas of the myocardium. Rest Images:  Normal homogeneous uptake in all areas of the myocardium. Subtraction (SDS):  No evidence of ischemia. Transient Ischemic Dilatation (Normal <1.22):  0.91 Lung/Heart Ratio (Normal <0.45):  0.34  Quantitative Gated Spect Images QGS EDV:  60 ml QGS ESV:  17 ml  Impression Exercise Capacity:  Lexiscan with low level exercise. BP Response:  Normal blood pressure response. Clinical Symptoms:  There is dyspnea. ECG Impression:  No significant ST segment change suggestive of ischemia. Comparison with Prior Nuclear Study: No previous nuclear study performed  Overall Impression:  Normal stress nuclear study.  LV Ejection Fraction: 72%.  LV Wall Motion:  NL LV Function; NL Wall Motion   [employer]

## 2013-02-24 ENCOUNTER — Other Ambulatory Visit (HOSPITAL_BASED_OUTPATIENT_CLINIC_OR_DEPARTMENT_OTHER): Payer: Medicare Other

## 2013-02-24 ENCOUNTER — Ambulatory Visit (HOSPITAL_BASED_OUTPATIENT_CLINIC_OR_DEPARTMENT_OTHER): Payer: Medicare Other | Admitting: Internal Medicine

## 2013-02-24 ENCOUNTER — Telehealth: Payer: Self-pay | Admitting: Internal Medicine

## 2013-02-24 ENCOUNTER — Ambulatory Visit (INDEPENDENT_AMBULATORY_CARE_PROVIDER_SITE_OTHER): Payer: Medicare Other | Admitting: Internal Medicine

## 2013-02-24 ENCOUNTER — Ambulatory Visit (HOSPITAL_BASED_OUTPATIENT_CLINIC_OR_DEPARTMENT_OTHER): Payer: Medicare Other

## 2013-02-24 ENCOUNTER — Telehealth: Payer: Self-pay | Admitting: *Deleted

## 2013-02-24 ENCOUNTER — Encounter: Payer: Self-pay | Admitting: Internal Medicine

## 2013-02-24 ENCOUNTER — Telehealth: Payer: Self-pay | Admitting: Physician Assistant

## 2013-02-24 VITALS — BP 92/53 | HR 81 | Temp 97.5°F | Resp 19 | Ht 65.0 in | Wt 186.4 lb

## 2013-02-24 VITALS — BP 106/68 | HR 80 | Temp 97.7°F | Resp 18 | Ht 65.0 in | Wt 190.4 lb

## 2013-02-24 DIAGNOSIS — Z5111 Encounter for antineoplastic chemotherapy: Secondary | ICD-10-CM

## 2013-02-24 DIAGNOSIS — E559 Vitamin D deficiency, unspecified: Secondary | ICD-10-CM

## 2013-02-24 DIAGNOSIS — C9002 Multiple myeloma in relapse: Secondary | ICD-10-CM

## 2013-02-24 DIAGNOSIS — E782 Mixed hyperlipidemia: Secondary | ICD-10-CM | POA: Insufficient documentation

## 2013-02-24 DIAGNOSIS — K2100 Gastro-esophageal reflux disease with esophagitis, without bleeding: Secondary | ICD-10-CM

## 2013-02-24 DIAGNOSIS — N289 Disorder of kidney and ureter, unspecified: Secondary | ICD-10-CM

## 2013-02-24 DIAGNOSIS — I10 Essential (primary) hypertension: Secondary | ICD-10-CM

## 2013-02-24 DIAGNOSIS — Z853 Personal history of malignant neoplasm of breast: Secondary | ICD-10-CM

## 2013-02-24 DIAGNOSIS — Z0000 Encounter for general adult medical examination without abnormal findings: Secondary | ICD-10-CM

## 2013-02-24 DIAGNOSIS — Z79899 Other long term (current) drug therapy: Secondary | ICD-10-CM

## 2013-02-24 DIAGNOSIS — Z1212 Encounter for screening for malignant neoplasm of rectum: Secondary | ICD-10-CM

## 2013-02-24 DIAGNOSIS — R7303 Prediabetes: Secondary | ICD-10-CM | POA: Insufficient documentation

## 2013-02-24 DIAGNOSIS — Z5112 Encounter for antineoplastic immunotherapy: Secondary | ICD-10-CM

## 2013-02-24 DIAGNOSIS — R7309 Other abnormal glucose: Secondary | ICD-10-CM

## 2013-02-24 LAB — CBC WITH DIFFERENTIAL/PLATELET
BASO%: 1 % (ref 0.0–2.0)
Basophils Absolute: 0.1 10*3/uL (ref 0.0–0.1)
EOS ABS: 0.3 10*3/uL (ref 0.0–0.5)
EOS%: 4.4 % (ref 0.0–7.0)
HCT: 28.1 % — ABNORMAL LOW (ref 34.8–46.6)
HEMOGLOBIN: 9.1 g/dL — AB (ref 11.6–15.9)
LYMPH%: 23.9 % (ref 14.0–49.7)
MCH: 31.6 pg (ref 25.1–34.0)
MCHC: 32.4 g/dL (ref 31.5–36.0)
MCV: 97.6 fL (ref 79.5–101.0)
MONO#: 0.7 10*3/uL (ref 0.1–0.9)
MONO%: 12.4 % (ref 0.0–14.0)
NEUT%: 58.3 % (ref 38.4–76.8)
NEUTROS ABS: 3.5 10*3/uL (ref 1.5–6.5)
Platelets: 237 10*3/uL (ref 145–400)
RBC: 2.88 10*6/uL — AB (ref 3.70–5.45)
RDW: 18 % — AB (ref 11.2–14.5)
WBC: 5.9 10*3/uL (ref 3.9–10.3)
lymph#: 1.4 10*3/uL (ref 0.9–3.3)

## 2013-02-24 LAB — COMPREHENSIVE METABOLIC PANEL (CC13)
ALBUMIN: 3.6 g/dL (ref 3.5–5.0)
ALT: 26 U/L (ref 0–55)
ANION GAP: 9 meq/L (ref 3–11)
AST: 21 U/L (ref 5–34)
Alkaline Phosphatase: 151 U/L — ABNORMAL HIGH (ref 40–150)
BUN: 28.6 mg/dL — ABNORMAL HIGH (ref 7.0–26.0)
CALCIUM: 8.8 mg/dL (ref 8.4–10.4)
CO2: 20 meq/L — AB (ref 22–29)
Chloride: 111 mEq/L — ABNORMAL HIGH (ref 98–109)
Creatinine: 2.7 mg/dL — ABNORMAL HIGH (ref 0.6–1.1)
GLUCOSE: 108 mg/dL (ref 70–140)
POTASSIUM: 4 meq/L (ref 3.5–5.1)
Sodium: 140 mEq/L (ref 136–145)
Total Bilirubin: 1.04 mg/dL (ref 0.20–1.20)
Total Protein: 6.2 g/dL — ABNORMAL LOW (ref 6.4–8.3)

## 2013-02-24 MED ORDER — ONDANSETRON 8 MG/NS 50 ML IVPB
INTRAVENOUS | Status: AC
  Filled 2013-02-24: qty 8

## 2013-02-24 MED ORDER — SODIUM CHLORIDE 0.9 % IV SOLN
Freq: Once | INTRAVENOUS | Status: AC
  Administered 2013-02-24: 10:00:00 via INTRAVENOUS

## 2013-02-24 MED ORDER — UMECLIDINIUM-VILANTEROL 62.5-25 MCG/INH IN AEPB: INHALATION_SPRAY | RESPIRATORY_TRACT | Status: DC

## 2013-02-24 MED ORDER — BUPROPION HCL ER (XL) 150 MG PO TB24: 150.0000 mg | ORAL_TABLET | ORAL | Status: DC

## 2013-02-24 MED ORDER — ESOMEPRAZOLE MAGNESIUM 40 MG PO CPDR: 40.0000 mg | DELAYED_RELEASE_CAPSULE | Freq: Every day | ORAL | Status: DC

## 2013-02-24 MED ORDER — DEXTROSE 5 % IV SOLN
36.0000 mg/m2 | Freq: Once | INTRAVENOUS | Status: AC
  Administered 2013-02-24: 70 mg via INTRAVENOUS
  Filled 2013-02-24: qty 35

## 2013-02-24 MED ORDER — DEXAMETHASONE SODIUM PHOSPHATE 10 MG/ML IJ SOLN
INTRAMUSCULAR | Status: AC
  Filled 2013-02-24: qty 1

## 2013-02-24 MED ORDER — SODIUM CHLORIDE 0.9 % IV SOLN
Freq: Once | INTRAVENOUS | Status: AC
  Administered 2013-02-24: 11:00:00 via INTRAVENOUS

## 2013-02-24 MED ORDER — SODIUM CHLORIDE 0.9 % IV SOLN
300.0000 mg/m2 | Freq: Once | INTRAVENOUS | Status: AC
  Administered 2013-02-24: 580 mg via INTRAVENOUS
  Filled 2013-02-24: qty 29

## 2013-02-24 MED ORDER — DEXAMETHASONE SODIUM PHOSPHATE 10 MG/ML IJ SOLN
10.0000 mg | Freq: Once | INTRAMUSCULAR | Status: AC
  Administered 2013-02-24: 10 mg via INTRAVENOUS

## 2013-02-24 MED ORDER — ONDANSETRON 8 MG/50ML IVPB (CHCC)
8.0000 mg | Freq: Once | INTRAVENOUS | Status: AC
  Administered 2013-02-24: 8 mg via INTRAVENOUS

## 2013-02-24 MED ORDER — SODIUM CHLORIDE 0.9 % IJ SOLN
10.0000 mL | INTRAMUSCULAR | Status: DC | PRN
  Administered 2013-02-24: 10 mL
  Filled 2013-02-24: qty 10

## 2013-02-24 MED ORDER — RANITIDINE HCL 300 MG PO TABS: 300.0000 mg | ORAL_TABLET | Freq: Every day | ORAL | Status: DC

## 2013-02-24 MED ORDER — SUCRALFATE 1 G PO TABS: 1.0000 g | ORAL_TABLET | Freq: Four times a day (QID) | ORAL | Status: DC

## 2013-02-24 MED ORDER — HEPARIN SOD (PORK) LOCK FLUSH 100 UNIT/ML IV SOLN
500.0000 [IU] | Freq: Once | INTRAVENOUS | Status: AC | PRN
  Administered 2013-02-24: 500 [IU]
  Filled 2013-02-24: qty 5

## 2013-02-24 NOTE — Telephone Encounter (Signed)
lmomtcb x1 for pt 

## 2013-02-24 NOTE — Telephone Encounter (Signed)
Gave pt appt for lab,md and chemo for February 2015 called Michelle and left VM regarding chemo

## 2013-02-24 NOTE — Progress Notes (Signed)
Patient ID: Nancy Norris, female   DOB: 1940/08/29, 73 y.o.   MRN: [id number]  Annual Screening Comprehensive Examination  This very nice 73 y.o. DWF presents for complete physical.  Patient has been followed for Hypotension/Dysautonomia,   Prediabetes, Hyperlipidemia, and Vitamin D Deficiency. Most importantly she is currently undergoing ChemoTx by Dr Earlie Server for relapse of MM which Dx originally predates to 2000 followed by auto stem cell transplant in 2012 and did well until relapse in 2014. She has just completed her 3rd cycle of 3 weekly ChemoTx's with 1 week interim rests. She also has deteriorating renal functions and is being followed closely by [employer] [facility]. Also she has Hx/o Lt breast cancer in 2008.    HTN predates since     . Patient's BP has been controlled at home. Today's BP: 106/68 mmHg. Patient denies any cardiac symptoms as chest pain, palpitations, shortness of breath, dizziness or ankle swelling.   Patient's hyperlipidemia is controlled with diet and medications. Patient denies myalgias or other medication SE's. Last cholesterol last visit was 213, triglycerides 181, HDL 44 and LDL 133 in Mar 2014 - not at goal.     Patient has prediabetes 5.8% in May 2012 and with last A1c 5.7% in Mar 2014. Patient denies reactive hypoglycemic symptoms, visual blurring, diabetic polys, or paresthesias. A1c most likely will be elevated with her current high doses of dexamethasone in her ChemoTx regimen.   Patient has c/o worsening dyspepsia and acid -type water brash failing treatment wit protonix and qid carafate slurry.   Finally, patient has history of Vitamin D Deficiency of 46 in 2012 with last vitamin D 80 in Mar 2014.       Medication List       This list is accurate as of: 02/24/13  7:42 PM.  Always use your most recent med list.               acetaminophen 325 MG tablet  Commonly known as:  TYLENOL  Take 650 mg by mouth every 6 (six) hours as needed for mild  pain or moderate pain.     albuterol 108 (90 BASE) MCG/ACT inhaler  Commonly known as:  PROVENTIL HFA;VENTOLIN HFA  Inhale 2 puffs into the lungs 2 (two) times daily as needed for wheezing or shortness of breath.     ascorbic acid 250 MG Chew  Commonly known as:  VITAMIN C  Chew 250 mg by mouth daily.     aspirin EC 81 MG tablet  Take 1 tablet (81 mg total) by mouth daily.     buPROPion 150 MG 24 hr tablet  Commonly known as:  WELLBUTRIN XL  Take 1 tablet (150 mg total) by mouth every morning. For mood and depression     carvedilol 3.125 MG tablet  Commonly known as:  COREG     cetirizine 10 MG tablet  Commonly known as:  ZYRTEC  Take 10 mg by mouth at bedtime.     citalopram 40 MG tablet  Commonly known as:  CELEXA  Take 40 mg by mouth daily with breakfast.     cromolyn 4 % ophthalmic solution  Commonly known as:  OPTICROM  Place 1 drop into both eyes 4 (four) times daily.     cyanocobalamin 2000 MCG tablet  Take 2,000 mcg by mouth daily.     dexamethasone 4 MG tablet  Commonly known as:  DECADRON     esomeprazole 40 MG capsule  Commonly known as:  NEXIUM  Take 1 capsule (40 mg total) by mouth daily. Each morning for acid indigestion     Ferrous Sulfate 134 MG Tabs  Take 1 tablet by mouth daily.     gabapentin 300 MG capsule  Commonly known as:  NEURONTIN  Take 300 mg by mouth at bedtime.     HAIR/SKIN/NAILS PO  Take 1 tablet by mouth daily.     isosorbide mononitrate 30 MG 24 hr tablet  Commonly known as:  IMDUR     levothyroxine 150 MCG tablet  Commonly known as:  SYNTHROID, LEVOTHROID  TAKE 1 TABLET BY MOUTH ONCE DAILY.  1 1/2 tablet on saturday and sunday     lidocaine-prilocaine cream  Commonly known as:  EMLA  Apply 1 application topically as needed. Apply to port 1 hour before chemotherapy appt.     loteprednol 0.2 % Susp  Commonly known as:  LOTEMAX  Place 1 drop into both eyes 3 (three) times daily.     magic mouthwash w/lidocaine Soln   Take 5 mLs by mouth 4 (four) times daily as needed for mouth pain (swish and spit QID PRN).     nitroGLYCERIN 0.4 MG SL tablet  Commonly known as:  NITROSTAT  Place 1 tablet (0.4 mg total) under the tongue every 5 (five) minutes as needed for chest pain.     ondansetron 8 MG disintegrating tablet  Commonly known as:  ZOFRAN-ODT  Take 1 tablet (8 mg total) by mouth every 8 (eight) hours as needed for nausea or vomiting.     prochlorperazine 10 MG tablet  Commonly known as:  COMPAZINE  Take 1 tablet (10 mg total) by mouth every 6 (six) hours as needed for nausea or vomiting.     ranitidine 300 MG tablet  Commonly known as:  ZANTAC  Take 1 tablet (300 mg total) by mouth at bedtime. For acid reflux and indigestion     sucralfate 1 G tablet  Commonly known as:  CARAFATE  Take 1 tablet (1 g total) by mouth 4 (four) times daily. Dissolve in water and swallow     Umeclidinium-Vilanterol 62.5-25 MCG/INH Aepb  Commonly known as:  ANORO ELLIPTA  Use twice daily     UNABLE TO FIND  anora inhaler, 1 puff daily     Vitamin D3 10000 UNITS capsule  Take 5,000 Units by mouth daily.     zolpidem 5 MG tablet  Commonly known as:  AMBIEN  Take 1 tablet (5 mg total) by mouth at bedtime as needed for sleep.        Allergies  Allergen Reactions  . Codeine Anaphylaxis  . Latex Shortness Of Breath    Adhesive products   . Iodinated Diagnostic Agents Itching    Happened 40 years ago  . Other     Onion, chocolate causes headache  . Sulfa Antibiotics Itching  . Adhesive [Tape]     blisters    Past Medical History  Diagnosis Date  . Hyperkalemia   . Hypothyroidism   . COPD (chronic obstructive pulmonary disease)   . Hyponatremia   . Dizziness   . Fibromyalgia   . Breast cancer   . Multiple myeloma   . Mucositis   . CKD (chronic kidney disease) stage 3, GFR 30-59 ml/min   . Hx of echocardiogram     a.  Echocardiogram (12/26/2012): EF 81-82%, grade 1 diastolic dysfunction;   b.   Echocardiogram (02/2013): EF 55-60%, no WMA, trivial effusion  . Fibromyalgia   .  Arthritis   . Hx of cardiovascular stress test     LexiScan with low level exercise Myoview (02/2013): No ischemia, EF 72%; normal study    Past Surgical History  Procedure Laterality Date  . History of port removal    . Status post stem cell transplant on September 28, 2008.    Marland Kitchen Abdominal hysterectomy  1981  . Cholecystectomy  1971  . Mastectomy Left 2008  . Cataract extraction, bilateral      Family History  Problem Relation Age of Onset  . Arthritis Mother   . Asthma Mother     History  Substance Use Topics  . Smoking status: Former Smoker -- 1.00 packs/day for 30 years    Types: Cigarettes    Quit date: 02/15/2005  . Smokeless tobacco: Never Used  . Alcohol Use: No    ROS Constitutional: Denies fever, chills, weight loss/gain, headaches, insomnia, fatigue, night sweats, and change in appetite. Eyes: Denies redness, blurred vision, diplopia, discharge, itchy, watery eyes.  ENT: Denies discharge, congestion, post nasal drip, epistaxis, sore throat, earache, hearing loss, dental pain, Tinnitus, Vertigo, Sinus pain, snoring.  Cardio: Denies chest pain, palpitations, irregular heartbeat, syncope, dyspnea, diaphoresis, orthopnea, PND, claudication, edema Respiratory: denies cough, dyspnea, DOE, pleurisy, hoarseness, laryngitis, wheezing.  Gastrointestinal: c/o  heartburn, reflux and water brash as above and denies dysphagia, pain, cramps, nausea, vomiting, bloating, diarrhea, constipation, hematemesis, melena, hematochezia, jaundice, hemorrhoids Genitourinary: Denies dysuria, frequency, urgency, nocturia, hesitancy, discharge, hematuria, flank pain Breast:Breast lumps, nipple discharge, bleeding.  Musculoskeletal: Denies arthralgia, myalgia, stiffness, Jt. Swelling, pain, limp, and strain/sprain. Skin: Denies puritis, rash, hives, warts, acne, eczema, changing in skin lesion Neuro: No weakness,  tremor, incoordination, spasms, paresthesia, pain Psychiatric: Denies confusion, memory loss, sensory loss Endocrine: Denies change in weight, skin, hair change, nocturia, and paresthesia, diabetic polys, visual blurring, hyper / hypo glycemic episodes.  Heme/Lymph: No excessive bleeding, bruising, enlarged lymph nodes.  BP: 106/68  Pulse: 80  Temp: 97.7 F (36.5 C)  Resp: 18    Estimated body mass index is 31.68 kg/(m^2) as calculated from the following:   Height as of this encounter: $RemoveBeforeD'5\' 5"'[id number]$  (1.651 m).   Weight as of this encounter: 190 lb 6.4 oz (86.365 kg).  Physical Exam General Appearance: Well nourished, in no apparent distress. Eyes: PERRLA, EOMs, conjunctiva no swelling or erythema, normal fundi and vessels. Sinuses: No frontal/maxillary tenderness ENT/Mouth: EACs patent / TMs  nl. Nares clear without erythema, swelling, mucoid exudates. Oral hygiene is good. No erythema, swelling, or exudate. Tongue normal, non-obstructing. Tonsils not swollen or erythematous. Hearing normal.  Neck: Supple, thyroid normal. No bruits, nodes or JVD. Respiratory: Respiratory effort normal.  BS equal and clear bilateral without rales, rhonci, wheezing or stridor. Cardio: Heart sounds are normal with regular rate and rhythm and no murmurs, rubs or gallops. Peripheral pulses are normal and equal bilaterally without edema. No aortic or femoral bruits. Chest: symmetric with normal excursions and percussion. Breasts: Symmetric, without lumps, nipple discharge, retractions, or fibrocystic changes.  Abdomen: Flat, soft, with bowl sounds. Nontender, no guarding, rebound, hernias, masses, or organomegaly.  Lymphatics: Non tender without lymphadenopathy.  Musculoskeletal: Full ROM all peripheral extremities, joint stability, 5/5 strength, and normal gait. Skin: Warm and dry without rashes, lesions, cyanosis, clubbing or  ecchymosis.  Neuro: Cranial nerves intact, reflexes equal bilaterally. Normal muscle  tone, no cerebellar symptoms. Sensation intact.  Pysch: Awake and oriented X 3, normal affect, Insight and Judgment appropriate.   Assessment and Plan  1. Annual  Screening Examination 2. Hypotension, Hx/o 3. Hyperlipidemia 4. Pre Diabetes 5. Vitamin D Deficiency 6. Cancer Breast Lt (2008) 7. Multiple Myeloma as above 8. Chronic Kidney Disease, stage IV 9. GERD  Continue prudent diet as discussed, weight control, BP monitoring, regular exercise, and medications. Discussed med's effects and SE's. Screening labs and tests as requested with regular follow-up as recommended. Will switch Pantoprazole to Nexium 40 mg qam and add Ranitidine 300 mg qhs as she apparently has GI consult upcoming in the near future.

## 2013-02-24 NOTE — Progress Notes (Signed)
[name] [phone]   [phone]) [phone]   OFFICE PROGRESS NOTE  PRINCIPAL DIAGNOSES:  1. Recurrent multiple myeloma initially diagnosed in 2002, initially with smoldering myeloma at [facility].  2. Ductal carcinoma in situ status post mastectomy with sentinel lymph node biopsy in October 2008.  PRIOR THERAPY:  1. Status post treatment with tamoxifen from November 2008 through February 2009, discontinued secondary to intolerance. 2. Status post 3 cycles of chemotherapy with Revlimid and Decadron followed by 1 cycle of Decadron only with mild response. 3. Status post 2 cycles of systemic chemotherapy with Velcade, Doxil and Decadron discontinued secondary to significant peripheral neuropathy. Last dose was given May 2010 at [facility]. 4. Status post autologous peripheral blood stem cell transplant on October 01, 2008 at [facility] under the care of Dr. Phyllis Ginger.  CURRENT THERAPY: Systemic chemotherapy with Carfilzomib initially was 20 mg/M2 and will be increased after cycle #1 to 36 mg/M2 on days 1, 2, 8, 9, 15 and 16 every 4 weeks in addition to Cytoxan 300 mg/M2 and Decadron 40 mg by mouth weekly basis, status post 2 cycles. First cycle on 12/29/2012.   INTERVAL HISTORY: Nancy Norris 73 y.o. female returns to the clinic today for followup visit. The patient is feeling fine today with no specific complaints except for fatigue. She is tolerating her treatment with Carfilzomib, dexamethasone and Cytoxan fairly well. She was seen recently by Dr. Annamaria Boots for evaluation and treatment of COPD and she was started on treatment with [facility] with significant improvement in her symptoms.  The patient denied having any significant weight loss or night sweats. She denied having any chest pain, shortness of breath, cough or hemoptysis. She has no nausea or vomiting. She has no peripheral neuropathy. She had repeat myeloma panel performed  recently and she is here for evaluation and discussion of her lab results.  MEDICAL HISTORY: Past Medical History  Diagnosis Date  . Hyperkalemia   . Hypothyroidism   . COPD (chronic obstructive pulmonary disease)   . Hyponatremia   . Dizziness   . Fibromyalgia   . Breast cancer   . Multiple myeloma   . Mucositis   . CKD (chronic kidney disease) stage 3, GFR 30-59 ml/min   . Hx of echocardiogram     a.  Echocardiogram (12/26/2012): EF 70-34%, grade 1 diastolic dysfunction;   b.  Echocardiogram (02/2013): EF 55-60%, no WMA, trivial effusion  . Fibromyalgia   . Arthritis     ALLERGIES:  is allergic to codeine; latex; iodinated diagnostic agents; other; sulfa antibiotics; and adhesive.  MEDICATIONS:  Current Outpatient Prescriptions  Medication Sig Dispense Refill  . acetaminophen (TYLENOL) 325 MG tablet Take 650 mg by mouth every 6 (six) hours as needed for mild pain or moderate pain.       Marland Kitchen albuterol (PROVENTIL HFA;VENTOLIN HFA) 108 (90 BASE) MCG/ACT inhaler Inhale 2 puffs into the lungs 2 (two) times daily as needed for wheezing or shortness of breath.      . Alum & Mag Hydroxide-Simeth (MAGIC MOUTHWASH W/LIDOCAINE) SOLN Take 5 mLs by mouth 4 (four) times daily as needed for mouth pain (swish and spit QID PRN).  240 mL  0  . ascorbic acid (VITAMIN C) 250 MG CHEW Chew 250 mg by mouth daily.      Marland Kitchen aspirin EC 81 MG tablet Take 1 tablet (81 mg total) by mouth daily.      . carvedilol (COREG) 3.125 MG  tablet       . cetirizine (ZYRTEC) 10 MG tablet Take 10 mg by mouth at bedtime.       . Cholecalciferol (VITAMIN D3) 10000 UNITS capsule Take 10,000 Units by mouth every other day.      . citalopram (CELEXA) 40 MG tablet Take 40 mg by mouth daily with breakfast.       . cromolyn (OPTICROM) 4 % ophthalmic solution Place 1 drop into both eyes 4 (four) times daily.      . cyanocobalamin 2000 MCG tablet Take 2,000 mcg by mouth daily.       Marland Kitchen dexamethasone (DECADRON) 4 MG tablet       .  Ferrous Sulfate 134 MG TABS Take 1 tablet by mouth daily.       Marland Kitchen gabapentin (NEURONTIN) 300 MG capsule Take 300 mg by mouth at bedtime.      . isosorbide mononitrate (IMDUR) 30 MG 24 hr tablet       . levothyroxine (SYNTHROID, LEVOTHROID) 150 MCG tablet TAKE 1 TABLET BY MOUTH ONCE DAILY.  90 tablet  1  . lidocaine-prilocaine (EMLA) cream Apply 1 application topically as needed. Apply to port 1 hour before chemotherapy appt.  30 g  0  . loteprednol (LOTEMAX) 0.2 % SUSP Place 1 drop into both eyes 3 (three) times daily.       . Multiple Vitamins-Minerals (HAIR/SKIN/NAILS PO) Take 1 tablet by mouth daily.      . nitroGLYCERIN (NITROSTAT) 0.4 MG SL tablet Place 1 tablet (0.4 mg total) under the tongue every 5 (five) minutes as needed for chest pain.  25 tablet  3  . ondansetron (ZOFRAN-ODT) 8 MG disintegrating tablet Take 1 tablet (8 mg total) by mouth every 8 (eight) hours as needed for nausea or vomiting.  20 tablet  3  . pantoprazole (PROTONIX) 40 MG tablet TAKE 1 TABLET BY MOUTH TWICE A DAY WITH MEALS.  60 tablet  1  . prochlorperazine (COMPAZINE) 10 MG tablet Take 1 tablet (10 mg total) by mouth every 6 (six) hours as needed for nausea or vomiting.  60 tablet  0  . zolpidem (AMBIEN) 5 MG tablet Take 1 tablet (5 mg total) by mouth at bedtime as needed for sleep.  30 tablet  0   No current facility-administered medications for this visit.   Facility-Administered Medications Ordered in Other Visits  Medication Dose Route Frequency Provider Last Rate Last Dose  . sodium chloride 0.9 % injection 10 mL  10 mL Intracatheter PRN Curt Bears, MD   10 mL at 01/05/13 1825    SURGICAL HISTORY:  Past Surgical History  Procedure Laterality Date  . History of port removal    . Status post stem cell transplant on September 28, 2008.    Marland Kitchen Abdominal hysterectomy  1981  . Cholecystectomy  1971  . Mastectomy Left 2008  . Cataract extraction, bilateral      REVIEW OF SYSTEMS:  Constitutional:  positive for fatigue Eyes: negative Ears, nose, mouth, throat, and face: negative Respiratory: negative Cardiovascular: negative Gastrointestinal: positive for constipation Genitourinary:negative Integument/breast: negative Hematologic/lymphatic: negative Musculoskeletal:negative Neurological: negative Behavioral/Psych: negative Endocrine: negative Allergic/Immunologic: negative   PHYSICAL EXAMINATION: General appearance: alert, cooperative and no distress Head: Normocephalic, without obvious abnormality, atraumatic Neck: no adenopathy Lymph nodes: Cervical, supraclavicular, and axillary nodes normal. Resp: clear to auscultation bilaterally Back: symmetric, no curvature. ROM normal. No CVA tenderness. Cardio: regular rate and rhythm, S1, S2 normal, no murmur, click, rub or gallop GI: soft, non-tender;  bowel sounds normal; no masses,  no organomegaly Extremities: extremities normal, atraumatic, no cyanosis or edema Neurologic: Alert and oriented X 3, normal strength and tone. Normal symmetric reflexes. Normal coordination and gait  ECOG PERFORMANCE STATUS: 1 - Symptomatic but completely ambulatory  Blood pressure 92/53, pulse 81, temperature 97.5 F (36.4 C), temperature source Oral, resp. rate 19, height _0  (1.651 m), weight 186 lb 6.4 oz (84.55 kg).  LABORATORY DATA: Lab Results  Component Value Date   WBC 5.9 02/24/2013   HGB 9.1* 02/24/2013   HCT 28.1* 02/24/2013   MCV 97.6 02/24/2013   PLT 237 02/24/2013      Chemistry      Component Value Date/Time   NA 141 02/18/2013 1407   NA 140 01/14/2013 0625   K 3.8 02/18/2013 1407   K 3.3* 01/14/2013 0625   CL 108 01/14/2013 0625   CL 105 03/19/2012 0811   CO2 23 02/18/2013 1407   CO2 18* 01/14/2013 0625   BUN 45.1* 02/18/2013 1407   BUN 29* 01/14/2013 0625   CREATININE 3.0* 02/18/2013 1407   CREATININE 2.56* 01/14/2013 0625   CREATININE 3.15* 12/08/2012 1806      Component Value Date/Time   CALCIUM 9.5 02/18/2013 1407    CALCIUM 7.7* 01/14/2013 0625   ALKPHOS 173* 02/18/2013 1407   ALKPHOS 149* 12/01/2012 1609   AST 25 02/18/2013 1407   AST 27 12/01/2012 1609   ALT 46 02/18/2013 1407   ALT 24 12/01/2012 1609   BILITOT 0.93 02/18/2013 1407   BILITOT 0.7 12/01/2012 1609     Other lab results: IgG 622, IgA 14, IgM 17, free kappa light chain 1.60, free lambda light chain 0.19 with the kappa/lambda ratio of 8.42.  RADIOGRAPHIC STUDIES:  ASSESSMENT AND PLAN: This is a very pleasant 73 years old white female with recurrent multiple myeloma as well as history of ductal carcinoma in situ. The patient is currently on treatment with Carfilzomib, Cytoxan and dexamethasone status post 2 cycles with significant improvement in her myeloma panel. I discussed the lab result with the patient today and recommended for her to continue her treatment with the same regimen for 2 more cycles. She agrees to proceed with cycle #3 today as scheduled. She would come back for followup visit in 2 weeks for evaluation and management any adverse effect of her treatment For hypercalcemia, the patient will continue on monthly Zometa. For the renal insufficiency, she was advised to keep her appointment with Dr. Jimmy Footman and to increase her by mouth hydration.  She was advised to call immediately she has any concerning symptoms in the interval.  She was advised to call immediately if she has any concerning symptoms in the interval. All questions were answered. The patient knows to call the clinic with any problems, questions or concerns. We can certainly see the patient much sooner if necessary.  I spent 15 minutes counseling the patient face to face. The total time spent in the appointment was 25 minutes.  Disclaimer: This note was dictated with voice recognition software. Similar sounding words can inadvertently be transcribed and may not be corrected upon review.

## 2013-02-24 NOTE — Telephone Encounter (Signed)
Per staff phone call I have adjusted appts

## 2013-02-24 NOTE — Patient Instructions (Signed)
You will myeloma panel showed significant improvement in your disease. Continue chemotherapy with Carfilzomib, Cytoxan and dexamethasone. Followup visit in 2 weeks.

## 2013-02-24 NOTE — Telephone Encounter (Signed)
New Problem:  Pt states she is returning a call to [facility] for test results.

## 2013-02-24 NOTE — Patient Instructions (Addendum)
Gastroesophageal Reflux Disease, Adult Gastroesophageal reflux disease (GERD) happens when acid from your stomach flows up into the esophagus. When acid comes in contact with the esophagus, the acid causes soreness (inflammation) in the esophagus. Over time, GERD may create small holes (ulcers) in the lining of the esophagus. CAUSES   Increased body weight. This puts pressure on the stomach, making acid rise from the stomach into the esophagus.  Smoking. This increases acid production in the stomach.  Drinking alcohol. This causes decreased pressure in the lower esophageal sphincter (valve or ring of muscle between the esophagus and stomach), allowing acid from the stomach into the esophagus.  Late evening meals and a full stomach. This increases pressure and acid production in the stomach.  A malformed lower esophageal sphincter. Sometimes, no cause is found. SYMPTOMS   Burning pain in the lower part of the mid-chest behind the breastbone and in the mid-stomach area. This may occur twice a week or more often.  Trouble swallowing.  Sore throat.  Dry cough.  Asthma-like symptoms including chest tightness, shortness of breath, or wheezing. DIAGNOSIS  Your caregiver may be able to diagnose GERD based on your symptoms. In some cases, X-rays and other tests may be done to check for complications or to check the condition of your stomach and esophagus. TREATMENT  Your caregiver may recommend over-the-counter or prescription medicines to help decrease acid production. Ask your caregiver before starting or adding any new medicines.  HOME CARE INSTRUCTIONS   Change the factors that you can control. Ask your caregiver for guidance concerning weight loss, quitting smoking, and alcohol consumption.  Avoid foods and drinks that make your symptoms worse, such as:  Caffeine or alcoholic drinks.  Chocolate.  Peppermint or mint flavorings.  Garlic and onions.  Spicy foods.  Citrus  fruits, such as oranges, lemons, or limes.  Tomato-based foods such as sauce, chili, salsa, and pizza.  Fried and fatty foods.  Avoid lying down for the 3 hours prior to your bedtime or prior to taking a nap.  Eat small, frequent meals instead of large meals.  Wear loose-fitting clothing. Do not wear anything tight around your waist that causes pressure on your stomach.  Raise the head of your bed 6 to 8 inches with wood blocks to help you sleep. Extra pillows will not help.  Only take over-the-counter or prescription medicines for pain, discomfort, or fever as directed by your caregiver.  Do not take aspirin, ibuprofen, or other nonsteroidal anti-inflammatory drugs (NSAIDs). SEEK IMMEDIATE MEDICAL CARE IF:   You have pain in your arms, neck, jaw, teeth, or back.  Your pain increases or changes in intensity or duration.  You develop nausea, vomiting, or sweating (diaphoresis).  You develop shortness of breath, or you faint.  Your vomit is green, yellow, black, or looks like coffee grounds or blood.  Your stool is red, bloody, or black. These symptoms could be signs of other problems, such as heart disease, gastric bleeding, or esophageal bleeding. MAKE SURE YOU:   Understand these instructions.  Will watch your condition.  Will get help right away if you are not doing well or get worse. Document Released: 10/11/2004 Document Revised: 03/26/2011 Document Reviewed: 07/21/2010 [facility] Patient Information 2014 [place], [place].  Diet for Gastroesophageal Reflux Disease, Adult Reflux (acid reflux) is when acid from your stomach flows up into the esophagus. When acid comes in contact with the esophagus, the acid causes irritation and soreness (inflammation) in the esophagus. When reflux happens often or so  severely that it causes damage to the esophagus, it is called gastroesophageal reflux disease (GERD). Nutrition therapy can help ease the discomfort of GERD. FOODS OR DRINKS TO  AVOID OR LIMIT  Smoking or chewing tobacco. Nicotine is one of the most potent stimulants to acid production in the gastrointestinal tract.  Caffeinated and decaffeinated coffee and black tea.  Regular or low-calorie carbonated beverages or energy drinks (caffeine-free carbonated beverages are allowed).   Strong spices, such as black pepper, white pepper, red pepper, cayenne, curry powder, and chili powder.  Peppermint or spearmint.  Chocolate.  High-fat foods, including meats and fried foods. Extra added fats including oils, butter, salad dressings, and nuts. Limit these to less than 8 tsp per day.  Fruits and vegetables if they are not tolerated, such as citrus fruits or tomatoes.  Alcohol.  Any food that seems to aggravate your condition. If you have questions regarding your diet, call your caregiver or a registered dietitian. OTHER THINGS THAT MAY HELP GERD INCLUDE:   Eating your meals slowly, in a relaxed setting.  Eating 5 to 6 small meals per day instead of 3 large meals.  Eliminating food for a period of time if it causes distress.  Not lying down until 3 hours after eating a meal.  Keeping the head of your bed raised 6 to 9 inches (15 to 23 cm) by using a foam wedge or blocks under the legs of the bed. Lying flat may make symptoms worse.  Being physically active. Weight loss may be helpful in reducing reflux in overweight or obese adults.  Wear loose fitting clothing EXAMPLE MEAL PLAN This meal plan is approximately 2,000 calories based on [email or web address] meal planning guidelines. Breakfast   cup cooked oatmeal.  1 cup strawberries.  1 cup low-fat milk.  1 oz almonds. Snack  1 cup cucumber slices.  6 oz yogurt (made from low-fat or fat-free milk). Lunch  2 slice whole-wheat bread.  2 oz sliced [place].  2 tsp mayonnaise.  1 cup blueberries.  1 cup snap peas. Snack  6 whole-wheat crackers.  1 oz string cheese. Dinner   cup brown  rice.  1 cup mixed veggies.  1 tsp olive oil.  3 oz grilled fish. Document Released: 01/01/2005 Document Revised: 03/26/2011 Document Reviewed: 11/17/2010 [facility] Patient Information 2014 [place], [place].   Depression, Adult Depression refers to feeling sad, low, down in the dumps, blue, gloomy, or empty. In general, there are two kinds of depression: 1. Depression that we all experience from time to time because of upsetting life experiences, including the loss of a job or the ending of a relationship (normal sadness or normal grief). This kind of depression is considered normal, is short lived, and resolves within a few days to 2 weeks. (Depression experienced after the loss of a loved one is called bereavement. Bereavement often lasts longer than 2 weeks but normally gets better with time.) 2. Clinical depression, which lasts longer than normal sadness or normal grief or interferes with your ability to function at home, at work, and in school. It also interferes with your personal relationships. It affects almost every aspect of your life. Clinical depression is an illness. Symptoms of depression also can be caused by conditions other than normal sadness and grief or clinical depression. Examples of these conditions are listed as follows:  Physical illness Some physical illnesses, including underactive thyroid gland (hypothyroidism), severe anemia, specific types of cancer, diabetes, uncontrolled seizures, heart and lung problems, strokes, and chronic pain  are commonly associated with symptoms of depression.  Side effects of some prescription medicine In some people, certain types of prescription medicine can cause symptoms of depression.  Substance abuse Abuse of alcohol and illicit drugs can cause symptoms of depression. SYMPTOMS Symptoms of normal sadness and normal grief include the following:  Feeling sad or crying for short periods of time.  Not caring about anything  (apathy).  Difficulty sleeping or sleeping too much.  No longer able to enjoy the things you used to enjoy.  Desire to be by oneself all the time (social isolation).  Lack of energy or motivation.  Difficulty concentrating or remembering.  Change in appetite or weight.  Restlessness or agitation. Symptoms of clinical depression include the same symptoms of normal sadness or normal grief and also the following symptoms:  Feeling sad or crying all the time.  Feelings of guilt or worthlessness.  Feelings of hopelessness or helplessness.  Thoughts of suicide or the desire to harm yourself (suicidal ideation).  Loss of touch with reality (psychotic symptoms). Seeing or hearing things that are not real (hallucinations) or having false beliefs about your life or the people around you (delusions and paranoia). DIAGNOSIS  The diagnosis of clinical depression usually is based on the severity and duration of the symptoms. Your caregiver also will ask you questions about your medical history and substance use to find out if physical illness, use of prescription medicine, or substance abuse is causing your depression. Your caregiver also may order blood tests. TREATMENT  Typically, normal sadness and normal grief do not require treatment. However, sometimes antidepressant medicine is prescribed for bereavement to ease the depressive symptoms until they resolve. The treatment for clinical depression depends on the severity of your symptoms but typically includes antidepressant medicine, counseling with a mental health professional, or a combination of both. Your caregiver will help to determine what treatment is best for you. Depression caused by physical illness usually goes away with appropriate medical treatment of the illness. If prescription medicine is causing depression, talk with your caregiver about stopping the medicine, decreasing the dose, or substituting another medicine. Depression  caused by abuse of alcohol or illicit drugs abuse goes away with abstinence from these substances. Some adults need professional help in order to stop drinking or using drugs. SEEK IMMEDIATE CARE IF:  You have thoughts about hurting yourself or others.  You lose touch with reality (have psychotic symptoms).  You are taking medicine for depression and have a serious side effect. FOR MORE INFORMATION National Alliance on Mental Illness: www.nami.[employer] of Mental [employer]: https://carter.com/ Document Released: 12/30/1999 Document Revised: 07/03/2011 Document Reviewed: 04/02/2011 [facility] Patient Information 2014 [place]. Hypertension As your heart beats, it forces blood through your arteries. This force is your blood pressure. If the pressure is too high, it is called hypertension (HTN) or high blood pressure. HTN is dangerous because you may have it and not know it. High blood pressure may mean that your heart has to work harder to pump blood. Your arteries may be narrow or stiff. The extra work puts you at risk for heart disease, stroke, and other problems.  Blood pressure consists of two numbers, a higher number over a lower, 110/72, for example. It is stated as "110 over 72." The ideal is below 120 for the top number (systolic) and under 80 for the bottom (diastolic). Write down your blood pressure today. You should pay close attention to your blood pressure if you have certain conditions such  as:  Heart failure.  Prior heart attack.  Diabetes  Chronic kidney disease.  Prior stroke.  Multiple risk factors for heart disease. To see if you have HTN, your blood pressure should be measured while you are seated with your arm held at the level of the heart. It should be measured at least twice. A one-time elevated blood pressure reading (especially in the Emergency Department) does not mean that you need treatment. There may be conditions in which the blood pressure is  different between your right and left arms. It is important to see your caregiver soon for a recheck. Most people have essential hypertension which means that there is not a specific cause. This type of high blood pressure may be lowered by changing lifestyle factors such as:  Stress.  Smoking.  Lack of exercise.  Excessive weight.  Drug/tobacco/alcohol use.  Eating less salt. Most people do not have symptoms from high blood pressure until it has caused damage to the body. Effective treatment can often prevent, delay or reduce that damage. TREATMENT  When a cause has been identified, treatment for high blood pressure is directed at the cause. There are a large number of medications to treat HTN. These fall into several categories, and your caregiver will help you select the medicines that are best for you. Medications may have side effects. You should review side effects with your caregiver. If your blood pressure stays high after you have made lifestyle changes or started on medicines,   Your medication(s) may need to be changed.  Other problems may need to be addressed.  Be certain you understand your prescriptions, and know how and when to take your medicine.  Be sure to follow up with your caregiver within the time frame advised (usually within two weeks) to have your blood pressure rechecked and to review your medications.  If you are taking more than one medicine to lower your blood pressure, make sure you know how and at what times they should be taken. Taking two medicines at the same time can result in blood pressure that is too low. SEEK IMMEDIATE MEDICAL CARE IF:  You develop a severe headache, blurred or changing vision, or confusion.  You have unusual weakness or numbness, or a faint feeling.  You have severe chest or abdominal pain, vomiting, or breathing problems. MAKE SURE YOU:   Understand these instructions.  Will watch your condition.  Will get help right  away if you are not doing well or get worse.   Diabetes and Exercise Exercising regularly is important. It is not just about losing weight. It has many health benefits, such as:  Improving your overall fitness, flexibility, and endurance.  Increasing your bone density.  Helping with weight control.  Decreasing your body fat.  Increasing your muscle strength.  Reducing stress and tension.  Improving your overall health. People with diabetes who exercise gain additional benefits because exercise:  Reduces appetite.  Improves the body's use of blood sugar (glucose).  Helps lower or control blood glucose.  Decreases blood pressure.  Helps control blood lipids (such as cholesterol and triglycerides).  Improves the body's use of the hormone insulin by:  Increasing the body's insulin sensitivity.  Reducing the body's insulin needs.  Decreases the risk for heart disease because exercising:  Lowers cholesterol and triglycerides levels.  Increases the levels of good cholesterol (such as high-density lipoproteins [HDL]) in the body.  Lowers blood glucose levels. YOUR ACTIVITY PLAN  Choose an activity that you enjoy  and set realistic goals. Your health care provider or diabetes educator can help you make an activity plan that works for you. You can break activities into 2 or 3 sessions throughout the day. Doing so is as good as one long session. Exercise ideas include:  Taking the dog for a walk.  Taking the stairs instead of the elevator.  Dancing to your favorite song.  Doing your favorite exercise with a friend. RECOMMENDATIONS FOR EXERCISING WITH TYPE 1 OR TYPE 2 DIABETES   Check your blood glucose before exercising. If blood glucose levels are greater than 240 mg/dL, check for urine ketones. Do not exercise if ketones are present.  Avoid injecting insulin into areas of the body that are going to be exercised. For example, avoid injecting insulin into:  The arms  when playing tennis.  The legs when jogging.  Keep a record of:  Food intake before and after you exercise.  Expected peak times of insulin action.  Blood glucose levels before and after you exercise.  The type and amount of exercise you have done.  Review your records with your health care provider. Your health care provider will help you to develop guidelines for adjusting food intake and insulin amounts before and after exercising.  If you take insulin or oral hypoglycemic agents, watch for signs and symptoms of hypoglycemia. They include:  Dizziness.  Shaking.  Sweating.  Chills.  Confusion.  Drink plenty of water while you exercise to prevent dehydration or heat stroke. Body water is lost during exercise and must be replaced.  Talk to your health care provider before starting an exercise program to make sure it is safe for you. Remember, almost any type of activity is better than none.    Cholesterol Cholesterol is a white, waxy, fat-like protein needed by your body in small amounts. The liver makes all the cholesterol you need. It is carried from the liver by the blood through the blood vessels. Deposits (plaque) may build up on blood vessel walls. This makes the arteries narrower and stiffer. Plaque increases the risk for heart attack and stroke. You cannot feel your cholesterol level even if it is very high. The only way to know is by a blood test to check your lipid (fats) levels. Once you know your cholesterol levels, you should keep a record of the test results. Work with your caregiver to to keep your levels in the desired range. WHAT THE RESULTS MEAN:  Total cholesterol is a rough measure of all the cholesterol in your blood.  LDL is the so-called bad cholesterol. This is the type that deposits cholesterol in the walls of the arteries. You want this level to be low.  HDL is the good cholesterol because it cleans the arteries and carries the LDL away. You want  this level to be high.  Triglycerides are fat that the body can either burn for energy or store. High levels are closely linked to heart disease. DESIRED LEVELS:  Total cholesterol below 200.  LDL below 100 for people at risk, below 70 for very high risk.  HDL above 50 is good, above 60 is best.  Triglycerides below 150. HOW TO LOWER YOUR CHOLESTEROL:  Diet.  Choose fish or white meat chicken and [place], roasted or baked. Limit fatty cuts of red meat, fried foods, and processed meats, such as sausage and lunch meat.  Eat lots of fresh fruits and vegetables. Choose whole grains, beans, pasta, potatoes and cereals.  Use only small amounts  of olive, corn or canola oils. Avoid butter, mayonnaise, shortening or palm kernel oils. Avoid foods with trans-fats.  Use skim/nonfat milk and low-fat/nonfat yogurt and cheeses. Avoid whole milk, cream, ice cream, egg yolks and cheeses. Healthy desserts include angel food cake, ginger snaps, animal crackers, hard candy, popsicles, and low-fat/nonfat frozen yogurt. Avoid pastries, cakes, pies and cookies.  Exercise.  A regular program helps decrease LDL and raises HDL.  Helps with weight control.  Do things that increase your activity level like gardening, walking, or taking the stairs.  Medication.  May be prescribed by your caregiver to help lowering cholesterol and the risk for heart disease.  You may need medicine even if your levels are normal if you have several risk factors. HOME CARE INSTRUCTIONS   Follow your diet and exercise programs as suggested by your caregiver.  Take medications as directed.  Have blood work done when your caregiver feels it is necessary. MAKE SURE YOU:   Understand these instructions.  Will watch your condition.  Will get help right away if you are not doing well or get worse.      Vitamin D Deficiency Vitamin D is an important vitamin that your body needs. Having too little of it in your body  is called a deficiency. A very bad deficiency can make your bones soft and can cause a condition called rickets.  Vitamin D is important to your body for different reasons, such as:   It helps your body absorb 2 minerals called calcium and phosphorus.  It helps make your bones healthy.  It may prevent some diseases, such as diabetes and multiple sclerosis.  It helps your muscles and heart. You can get vitamin D in several ways. It is a natural part of some foods. The vitamin is also added to some dairy products and cereals. Some people take vitamin D supplements. Also, your body makes vitamin D when you are in the sun. It changes the sun's rays into a form of the vitamin that your body can use. CAUSES   Not eating enough foods that contain vitamin D.  Not getting enough sunlight.  Having certain digestive system diseases that make it hard to absorb vitamin D. These diseases include Crohn's disease, chronic pancreatitis, and cystic fibrosis.  Having a surgery in which part of the stomach or small intestine is removed.  Being obese. Fat cells pull vitamin D out of your blood. That means that obese people may not have enough vitamin D left in their blood and in other body tissues.  Having chronic kidney or liver disease. RISK FACTORS Risk factors are things that make you more likely to develop a vitamin D deficiency. They include:  Being older.  Not being able to get outside very much.  Living in a nursing home.  Having had broken bones.  Having weak or thin bones (osteoporosis).  Having a disease or condition that changes how your body absorbs vitamin D.  Having dark skin.  Some medicines such as seizure medicines or steroids.  Being overweight or obese. SYMPTOMS Mild cases of vitamin D deficiency may not have any symptoms. If you have a very bad case, symptoms may include:  Bone pain.  Muscle pain.  Falling often.  Broken bones caused by a minor injury, due to  osteoporosis. DIAGNOSIS A blood test is the best way to tell if you have a vitamin D deficiency. TREATMENT Vitamin D deficiency can be treated in different ways. Treatment for vitamin D deficiency depends  on what is causing it. Options include:  Taking vitamin D supplements.  Taking a calcium supplement. Your caregiver will suggest what dose is best for you. HOME CARE INSTRUCTIONS  Take any supplements that your caregiver prescribes. Follow the directions carefully. Take only the suggested amount.  Have your blood tested 2 months after you start taking supplements.  Eat foods that contain vitamin D. Healthy choices include:  Fortified dairy products, cereals, or juices. Fortified means vitamin D has been added to the food. Check the label on the package to be sure.  Fatty fish like salmon or trout.  Eggs.  Oysters.  Do not use a tanning bed.  Keep your weight at a healthy level. Lose weight if you need to.  Keep all follow-up appointments. Your caregiver will need to perform blood tests to make sure your vitamin D deficiency is going away. SEEK MEDICAL CARE IF:  You have any questions about your treatment.  You continue to have symptoms of vitamin D deficiency.  You have nausea or vomiting.  You are constipated.  You feel confused.  You have severe abdominal or back pain. MAKE SURE YOU:  Understand these instructions.  Will watch your condition.  Will get help right away if you are not doing well or get worse.

## 2013-02-24 NOTE — Patient Instructions (Signed)
[facility] Discharge Instructions for Patients Receiving Chemotherapy  Today you received the following chemotherapy agents:  Cytoxan and Kyprolis  To help prevent nausea and vomiting after your treatment, we encourage you to take your nausea medication as ordered per MD. If you develop nausea and vomiting that is not controlled by your nausea medication, call the clinic.   BELOW ARE SYMPTOMS THAT SHOULD BE REPORTED IMMEDIATELY:  *FEVER GREATER THAN 100.5 F  *CHILLS WITH OR WITHOUT FEVER  NAUSEA AND VOMITING THAT IS NOT CONTROLLED WITH YOUR NAUSEA MEDICATION  *UNUSUAL SHORTNESS OF BREATH  *UNUSUAL BRUISING OR BLEEDING  TENDERNESS IN MOUTH AND THROAT WITH OR WITHOUT PRESENCE OF ULCERS  *URINARY PROBLEMS  *BOWEL PROBLEMS  UNUSUAL RASH Items with * indicate a potential emergency and should be followed up as soon as possible.  Feel free to call the clinic you have any questions or concerns. The clinic phone number is [phone].

## 2013-02-24 NOTE — Telephone Encounter (Signed)
Calledm pt reminded of appt for tommorrow for chem and advisr pt to get appt calendar  for February

## 2013-02-24 NOTE — Telephone Encounter (Signed)
pt notified about normal myoview results with verbal understanding

## 2013-02-25 ENCOUNTER — Ambulatory Visit (HOSPITAL_BASED_OUTPATIENT_CLINIC_OR_DEPARTMENT_OTHER): Payer: Medicare Other

## 2013-02-25 VITALS — BP 103/51 | HR 73 | Temp 98.9°F | Resp 18

## 2013-02-25 DIAGNOSIS — Z5112 Encounter for antineoplastic immunotherapy: Secondary | ICD-10-CM

## 2013-02-25 DIAGNOSIS — C9002 Multiple myeloma in relapse: Secondary | ICD-10-CM

## 2013-02-25 LAB — HEPATIC FUNCTION PANEL
ALBUMIN: 3.9 g/dL (ref 3.5–5.2)
ALT: 34 U/L (ref 0–35)
AST: 38 U/L — ABNORMAL HIGH (ref 0–37)
Alkaline Phosphatase: 170 U/L — ABNORMAL HIGH (ref 39–117)
BILIRUBIN TOTAL: 0.9 mg/dL (ref 0.2–1.2)
Bilirubin, Direct: 0.2 mg/dL (ref 0.0–0.3)
Indirect Bilirubin: 0.7 mg/dL (ref 0.2–1.2)
TOTAL PROTEIN: 5.9 g/dL — AB (ref 6.0–8.3)

## 2013-02-25 LAB — BASIC METABOLIC PANEL WITH GFR
BUN: 26 mg/dL — AB (ref 6–23)
CO2: 20 mEq/L (ref 19–32)
Calcium: 8.8 mg/dL (ref 8.4–10.5)
Chloride: 110 mEq/L (ref 96–112)
Creat: 2.45 mg/dL — ABNORMAL HIGH (ref 0.50–1.10)
GFR, EST AFRICAN AMERICAN: 22 mL/min — AB
GFR, Est Non African American: 19 mL/min — ABNORMAL LOW
Glucose, Bld: 135 mg/dL — ABNORMAL HIGH (ref 70–99)
POTASSIUM: 4.5 meq/L (ref 3.5–5.3)
Sodium: 140 mEq/L (ref 135–145)

## 2013-02-25 LAB — VITAMIN D 25 HYDROXY (VIT D DEFICIENCY, FRACTURES): Vit D, 25-Hydroxy: 62 ng/mL (ref 30–89)

## 2013-02-25 LAB — LIPID PANEL
CHOLESTEROL: 208 mg/dL — AB (ref 0–200)
HDL: 48 mg/dL (ref >39)
LDL Cholesterol: 131 mg/dL — ABNORMAL HIGH (ref 0–99)
TRIGLYCERIDES: 147 mg/dL (ref <150)
Total CHOL/HDL Ratio: 4.3 Ratio
VLDL: 29 mg/dL (ref 0–40)

## 2013-02-25 LAB — MAGNESIUM: Magnesium: 2.1 mg/dL (ref 1.5–2.5)

## 2013-02-25 LAB — MICROALBUMIN / CREATININE URINE RATIO
Creatinine, Urine: 52.8 mg/dL
Microalb Creat Ratio: 48.1 mg/g — ABNORMAL HIGH (ref 0.0–30.0)
Microalb, Ur: 2.54 mg/dL — ABNORMAL HIGH (ref 0.00–1.89)

## 2013-02-25 LAB — URINALYSIS, MICROSCOPIC ONLY
Bacteria, UA: NONE SEEN
CRYSTALS: NONE SEEN
Casts: NONE SEEN

## 2013-02-25 LAB — TSH: TSH: 1.251 u[IU]/mL (ref 0.350–4.500)

## 2013-02-25 LAB — INSULIN, FASTING: INSULIN FASTING, SERUM: 23 u[IU]/mL (ref 3–28)

## 2013-02-25 LAB — HEMOGLOBIN A1C
Hgb A1c MFr Bld: 4.9 % (ref <5.7)
MEAN PLASMA GLUCOSE: 94 mg/dL (ref <117)

## 2013-02-25 MED ORDER — DEXTROSE 5 % IV SOLN
36.0000 mg/m2 | Freq: Once | INTRAVENOUS | Status: AC
  Administered 2013-02-25: 70 mg via INTRAVENOUS
  Filled 2013-02-25: qty 35

## 2013-02-25 MED ORDER — ONDANSETRON 8 MG/NS 50 ML IVPB
INTRAVENOUS | Status: AC
  Filled 2013-02-25: qty 8

## 2013-02-25 MED ORDER — SODIUM CHLORIDE 0.9 % IJ SOLN
10.0000 mL | INTRAMUSCULAR | Status: DC | PRN
  Administered 2013-02-25: 10 mL
  Filled 2013-02-25: qty 10

## 2013-02-25 MED ORDER — DEXAMETHASONE SODIUM PHOSPHATE 10 MG/ML IJ SOLN
10.0000 mg | Freq: Once | INTRAMUSCULAR | Status: AC
  Administered 2013-02-25: 10 mg via INTRAVENOUS

## 2013-02-25 MED ORDER — HEPARIN SOD (PORK) LOCK FLUSH 100 UNIT/ML IV SOLN
500.0000 [IU] | Freq: Once | INTRAVENOUS | Status: AC | PRN
  Administered 2013-02-25: 500 [IU]
  Filled 2013-02-25: qty 5

## 2013-02-25 MED ORDER — SODIUM CHLORIDE 0.9 % IV SOLN
Freq: Once | INTRAVENOUS | Status: AC
  Administered 2013-02-25: 11:00:00 via INTRAVENOUS

## 2013-02-25 MED ORDER — ONDANSETRON 8 MG/50ML IVPB (CHCC)
8.0000 mg | Freq: Once | INTRAVENOUS | Status: AC
  Administered 2013-02-25: 8 mg via INTRAVENOUS

## 2013-02-25 MED ORDER — DEXAMETHASONE SODIUM PHOSPHATE 10 MG/ML IJ SOLN
INTRAMUSCULAR | Status: AC
  Filled 2013-02-25: qty 1

## 2013-02-25 NOTE — Telephone Encounter (Signed)
I spoke with the pt and she saw her PCP yesterday and he gave her rx. Throop Bing, CMA

## 2013-02-25 NOTE — Patient Instructions (Signed)
[facility] Discharge Instructions for Patients Receiving Chemotherapy  Today you received the following chemotherapy agents: Kyprolis  To help prevent nausea and vomiting after your treatment, we encourage you to take your nausea medication as prescribed by your physician.    If you develop nausea and vomiting that is not controlled by your nausea medication, call the clinic.   BELOW ARE SYMPTOMS THAT SHOULD BE REPORTED IMMEDIATELY:  *FEVER GREATER THAN 100.5 F  *CHILLS WITH OR WITHOUT FEVER  NAUSEA AND VOMITING THAT IS NOT CONTROLLED WITH YOUR NAUSEA MEDICATION  *UNUSUAL SHORTNESS OF BREATH  *UNUSUAL BRUISING OR BLEEDING  TENDERNESS IN MOUTH AND THROAT WITH OR WITHOUT PRESENCE OF ULCERS  *URINARY PROBLEMS  *BOWEL PROBLEMS  UNUSUAL RASH Items with * indicate a potential emergency and should be followed up as soon as possible.  Feel free to call the clinic you have any questions or concerns. The clinic phone number is [phone].

## 2013-03-02 ENCOUNTER — Telehealth: Payer: Self-pay | Admitting: Internal Medicine

## 2013-03-02 ENCOUNTER — Telehealth: Payer: Self-pay | Admitting: Medical Oncology

## 2013-03-02 NOTE — Telephone Encounter (Signed)
Pt cancelled chemo this week due to weather.

## 2013-03-02 NOTE — Telephone Encounter (Signed)
pt called and advised that she will not be comming for 2.17 and 2.18 tx due to weather.  She also stated that she will come to the 2.24.15 to get sched fixed.  Sent staff msg to Dr. Tyrell Antonio and lvm for desk nurse that appt cx per pt request.

## 2013-03-03 ENCOUNTER — Ambulatory Visit: Payer: Medicare Other

## 2013-03-03 ENCOUNTER — Ambulatory Visit: Payer: No Typology Code available for payment source | Admitting: Cardiovascular Disease

## 2013-03-03 ENCOUNTER — Other Ambulatory Visit: Payer: Medicare Other

## 2013-03-04 ENCOUNTER — Telehealth: Payer: Self-pay | Admitting: Internal Medicine

## 2013-03-04 ENCOUNTER — Ambulatory Visit: Payer: Medicare Other

## 2013-03-04 ENCOUNTER — Ambulatory Visit: Payer: Medicare Other | Admitting: Cardiovascular Disease

## 2013-03-04 NOTE — Telephone Encounter (Signed)
Result Note     XR spine- stable old compression fracture at about the level of the pain. I can't completely rule out something new, but most likely she just re-injured this old fracture. Treat with heat and pain meds. If it doesn't get better, let them know at her [place] appointment.      Spoke with patient-she is aware of results.

## 2013-03-10 ENCOUNTER — Ambulatory Visit: Payer: Medicare Other

## 2013-03-10 ENCOUNTER — Telehealth: Payer: Self-pay | Admitting: Internal Medicine

## 2013-03-10 ENCOUNTER — Ambulatory Visit: Payer: Medicare Other | Admitting: Physician Assistant

## 2013-03-10 ENCOUNTER — Other Ambulatory Visit: Payer: Medicare Other

## 2013-03-10 NOTE — Telephone Encounter (Signed)
s.w. pt and she advised that she will not make todays' or [place] visit...pt requested to be sched on 3.3 and 3.4....done...emialed MM to advise on appt change...pt aware

## 2013-03-11 ENCOUNTER — Ambulatory Visit: Payer: Medicare Other

## 2013-03-16 ENCOUNTER — Telehealth: Payer: Self-pay | Admitting: Medical Oncology

## 2013-03-16 NOTE — Telephone Encounter (Signed)
Faxed order for mammogram to [facility]

## 2013-03-16 NOTE — Telephone Encounter (Signed)
Needs order for diagnostic mammogram with ultrasound if necessary.

## 2013-03-17 ENCOUNTER — Ambulatory Visit (HOSPITAL_BASED_OUTPATIENT_CLINIC_OR_DEPARTMENT_OTHER): Payer: Medicare Other | Admitting: Physician Assistant

## 2013-03-17 ENCOUNTER — Telehealth: Payer: Self-pay | Admitting: Internal Medicine

## 2013-03-17 ENCOUNTER — Telehealth: Payer: Self-pay | Admitting: *Deleted

## 2013-03-17 ENCOUNTER — Other Ambulatory Visit: Payer: Self-pay | Admitting: Internal Medicine

## 2013-03-17 ENCOUNTER — Other Ambulatory Visit (HOSPITAL_BASED_OUTPATIENT_CLINIC_OR_DEPARTMENT_OTHER): Payer: Medicare Other

## 2013-03-17 ENCOUNTER — Ambulatory Visit (HOSPITAL_BASED_OUTPATIENT_CLINIC_OR_DEPARTMENT_OTHER): Payer: Medicare Other

## 2013-03-17 ENCOUNTER — Encounter: Payer: Self-pay | Admitting: Physician Assistant

## 2013-03-17 VITALS — BP 123/60 | HR 74 | Temp 98.8°F | Resp 18 | Ht 65.0 in | Wt 187.6 lb

## 2013-03-17 VITALS — BP 124/58 | HR 72 | Temp 98.4°F | Resp 16

## 2013-03-17 DIAGNOSIS — C9002 Multiple myeloma in relapse: Secondary | ICD-10-CM

## 2013-03-17 DIAGNOSIS — N289 Disorder of kidney and ureter, unspecified: Secondary | ICD-10-CM

## 2013-03-17 DIAGNOSIS — Z5112 Encounter for antineoplastic immunotherapy: Secondary | ICD-10-CM

## 2013-03-17 DIAGNOSIS — Z5111 Encounter for antineoplastic chemotherapy: Secondary | ICD-10-CM

## 2013-03-17 DIAGNOSIS — R110 Nausea: Secondary | ICD-10-CM

## 2013-03-17 LAB — COMPREHENSIVE METABOLIC PANEL (CC13)
ALT: 18 U/L (ref 0–55)
AST: 19 U/L (ref 5–34)
Albumin: 3.8 g/dL (ref 3.5–5.0)
Alkaline Phosphatase: 116 U/L (ref 40–150)
Anion Gap: 9 mEq/L (ref 3–11)
BILIRUBIN TOTAL: 0.59 mg/dL (ref 0.20–1.20)
BUN: 23.6 mg/dL (ref 7.0–26.0)
CALCIUM: 9.4 mg/dL (ref 8.4–10.4)
CHLORIDE: 112 meq/L — AB (ref 98–109)
CO2: 22 mEq/L (ref 22–29)
Creatinine: 2.6 mg/dL — ABNORMAL HIGH (ref 0.6–1.1)
Glucose: 92 mg/dl (ref 70–140)
Potassium: 4.1 mEq/L (ref 3.5–5.1)
SODIUM: 143 meq/L (ref 136–145)
TOTAL PROTEIN: 6.1 g/dL — AB (ref 6.4–8.3)

## 2013-03-17 LAB — CBC WITH DIFFERENTIAL/PLATELET
BASO%: 0.6 % (ref 0.0–2.0)
Basophils Absolute: 0 10*3/uL (ref 0.0–0.1)
EOS%: 4.5 % (ref 0.0–7.0)
Eosinophils Absolute: 0.3 10*3/uL (ref 0.0–0.5)
HCT: 25.1 % — ABNORMAL LOW (ref 34.8–46.6)
HGB: 8.1 g/dL — ABNORMAL LOW (ref 11.6–15.9)
LYMPH%: 36.6 % (ref 14.0–49.7)
MCH: 34.2 pg — AB (ref 25.1–34.0)
MCHC: 32.3 g/dL (ref 31.5–36.0)
MCV: 105.9 fL — AB (ref 79.5–101.0)
MONO#: 0.9 10*3/uL (ref 0.1–0.9)
MONO%: 12.8 % (ref 0.0–14.0)
NEUT#: 3.1 10*3/uL (ref 1.5–6.5)
NEUT%: 45.5 % (ref 38.4–76.8)
PLATELETS: 209 10*3/uL (ref 145–400)
RBC: 2.37 10*6/uL — AB (ref 3.70–5.45)
RDW: 16.4 % — AB (ref 11.2–14.5)
WBC: 6.9 10*3/uL (ref 3.9–10.3)
lymph#: 2.5 10*3/uL (ref 0.9–3.3)

## 2013-03-17 MED ORDER — SODIUM CHLORIDE 0.9 % IV SOLN
300.0000 mg/m2 | Freq: Once | INTRAVENOUS | Status: AC
  Administered 2013-03-17: 580 mg via INTRAVENOUS
  Filled 2013-03-17: qty 29

## 2013-03-17 MED ORDER — ONDANSETRON 8 MG/50ML IVPB (CHCC)
8.0000 mg | Freq: Once | INTRAVENOUS | Status: AC
  Administered 2013-03-17: 8 mg via INTRAVENOUS

## 2013-03-17 MED ORDER — ONDANSETRON 8 MG/NS 50 ML IVPB
INTRAVENOUS | Status: AC
  Filled 2013-03-17: qty 8

## 2013-03-17 MED ORDER — LORAZEPAM 2 MG/ML IJ SOLN
0.2500 mg | Freq: Once | INTRAMUSCULAR | Status: AC
  Administered 2013-03-17: 0.25 mg via INTRAVENOUS

## 2013-03-17 MED ORDER — ZOLPIDEM TARTRATE 5 MG PO TABS: 5.0000 mg | ORAL_TABLET | Freq: Every evening | ORAL | Status: DC | PRN

## 2013-03-17 MED ORDER — HEPARIN SOD (PORK) LOCK FLUSH 100 UNIT/ML IV SOLN
500.0000 [IU] | Freq: Once | INTRAVENOUS | Status: AC | PRN
  Administered 2013-03-17: 500 [IU]
  Filled 2013-03-17: qty 5

## 2013-03-17 MED ORDER — SODIUM CHLORIDE 0.9 % IV SOLN
Freq: Once | INTRAVENOUS | Status: AC
  Administered 2013-03-17: 16:00:00 via INTRAVENOUS

## 2013-03-17 MED ORDER — DEXTROSE 5 % IV SOLN
36.0000 mg/m2 | Freq: Once | INTRAVENOUS | Status: AC
  Administered 2013-03-17: 70 mg via INTRAVENOUS
  Filled 2013-03-17: qty 35

## 2013-03-17 MED ORDER — LORAZEPAM 2 MG/ML IJ SOLN
INTRAMUSCULAR | Status: AC
  Filled 2013-03-17: qty 1

## 2013-03-17 MED ORDER — SODIUM CHLORIDE 0.9 % IV SOLN
Freq: Once | INTRAVENOUS | Status: AC
  Administered 2013-03-17: 15:00:00 via INTRAVENOUS

## 2013-03-17 MED ORDER — DEXAMETHASONE SODIUM PHOSPHATE 10 MG/ML IJ SOLN
10.0000 mg | Freq: Once | INTRAMUSCULAR | Status: AC
  Administered 2013-03-17: 10 mg via INTRAVENOUS

## 2013-03-17 MED ORDER — SODIUM CHLORIDE 0.9 % IJ SOLN
10.0000 mL | INTRAMUSCULAR | Status: DC | PRN
  Administered 2013-03-17: 10 mL
  Filled 2013-03-17: qty 10

## 2013-03-17 MED ORDER — DEXAMETHASONE SODIUM PHOSPHATE 10 MG/ML IJ SOLN
INTRAMUSCULAR | Status: AC
  Filled 2013-03-17: qty 1

## 2013-03-17 NOTE — Telephone Encounter (Signed)
Gave pt appt for lab and MD and emailed michelle regarding chemo

## 2013-03-17 NOTE — Progress Notes (Addendum)
[facility] [phone]   [phone]  SHARED VISIT PROGRESS NOTE  PRINCIPAL DIAGNOSES:  1. Recurrent multiple myeloma initially diagnosed in 2002, initially with smoldering myeloma at [facility].  2. Ductal carcinoma in situ status post mastectomy with sentinel lymph node biopsy in October 2008.  PRIOR THERAPY:  1. Status post treatment with tamoxifen from November 2008 through February 2009, discontinued secondary to intolerance. 2. Status post 3 cycles of chemotherapy with Revlimid and Decadron followed by 1 cycle of Decadron only with mild response. 3. Status post 2 cycles of systemic chemotherapy with Velcade, Doxil and Decadron discontinued secondary to significant peripheral neuropathy. Last dose was given May 2010 at [facility]. 4. Status post autologous peripheral blood stem cell transplant on October 01, 2008 at [facility] under the care of Dr. Phyllis Ginger.  CURRENT THERAPY: Systemic chemotherapy with Carfilzomib initially was 20 mg/M2 and will be increased after cycle #1 to 36 mg/M2 on days 1, 2, 8, 9, 15 and 16 every 4 weeks in addition to Cytoxan 300 mg/M2 and Decadron 40 mg by mouth weekly basis, status post 2 cycles, as well as day 1 and 2 of cycle #3. First cycle on 12/29/2012.   INTERVAL HISTORY: Nancy Norris 73 y.o. female returns to the clinic today for followup visit. The patient is feeling fine today with no specific complaints except for fatigue. She is tolerating her treatment with Carfilzomib, dexamethasone and Cytoxan fairly well. The patient denied having any significant weight loss or night sweats. She denied having any chest pain, shortness of breath, cough or hemoptysis. She has no nausea or vomiting. She has no peripheral neuropathy. She states she was placed on Wellbutrin  recently instead of her Celexa and she feels significantly better. She is looking forward to taking a break after cycle #4  of her protein studies have improved/stabilized. She missed the bulk of cycle #3 due to the inclement weather.  MEDICAL HISTORY: Past Medical History  Diagnosis Date  . Hyperkalemia   . Hypothyroidism   . COPD (chronic obstructive pulmonary disease)   . Hyponatremia   . Dizziness   . Fibromyalgia   . Breast cancer   . Multiple myeloma   . Mucositis   . CKD (chronic kidney disease) stage 3, GFR 30-59 ml/min   . Hx of echocardiogram     a.  Echocardiogram (12/26/2012): EF 95-18%, grade 1 diastolic dysfunction;   b.  Echocardiogram (02/2013): EF 55-60%, no WMA, trivial effusion  . Fibromyalgia   . Arthritis   . Hx of cardiovascular stress test     LexiScan with low level exercise Myoview (02/2013): No ischemia, EF 72%; normal study    ALLERGIES:  is allergic to codeine; latex; iodinated diagnostic agents; other; sulfa antibiotics; and adhesive.  MEDICATIONS:  Current Outpatient Prescriptions  Medication Sig Dispense Refill  . acetaminophen (TYLENOL) 325 MG tablet Take 650 mg by mouth every 6 (six) hours as needed for mild pain or moderate pain.       Marland Kitchen albuterol (PROVENTIL HFA;VENTOLIN HFA) 108 (90 BASE) MCG/ACT inhaler Inhale 2 puffs into the lungs 2 (two) times daily as needed for wheezing or shortness of breath.      . Alum & Mag Hydroxide-Simeth (MAGIC MOUTHWASH W/LIDOCAINE) SOLN Take 5 mLs by mouth 4 (four) times daily as needed for mouth pain (swish and spit QID PRN).  240 mL  0  . ascorbic acid (VITAMIN C) 250 MG CHEW Chew 250  mg by mouth daily.      Marland Kitchen aspirin EC 81 MG tablet Take 1 tablet (81 mg total) by mouth daily.      Marland Kitchen buPROPion (WELLBUTRIN XL) 150 MG 24 hr tablet Take 1 tablet (150 mg total) by mouth every morning. For mood and depression  30 tablet  99  . carvedilol (COREG) 3.125 MG tablet       . cetirizine (ZYRTEC) 10 MG tablet Take 10 mg by mouth at bedtime.       . Cholecalciferol (VITAMIN D3) 10000 UNITS capsule Take 5,000 Units by mouth daily.       . citalopram  (CELEXA) 40 MG tablet TAKE 1 TABLET BY MOUTH ONCE DAILY.  90 tablet  0  . cromolyn (OPTICROM) 4 % ophthalmic solution Place 1 drop into both eyes 4 (four) times daily.      . cyanocobalamin 2000 MCG tablet Take 2,000 mcg by mouth daily.       Marland Kitchen dexamethasone (DECADRON) 4 MG tablet       . esomeprazole (NEXIUM) 40 MG capsule Take 1 capsule (40 mg total) by mouth daily. Each morning for acid indigestion  30 capsule  99  . Ferrous Sulfate 134 MG TABS Take 1 tablet by mouth daily.       Marland Kitchen gabapentin (NEURONTIN) 300 MG capsule Take 300 mg by mouth at bedtime.      . isosorbide mononitrate (IMDUR) 30 MG 24 hr tablet       . levothyroxine (SYNTHROID, LEVOTHROID) 150 MCG tablet TAKE 1 TABLET BY MOUTH ONCE DAILY.  1 1/2 tablet on saturday and sunday      . lidocaine-prilocaine (EMLA) cream Apply 1 application topically as needed. Apply to port 1 hour before chemotherapy appt.  30 g  0  . loteprednol (LOTEMAX) 0.2 % SUSP Place 1 drop into both eyes 3 (three) times daily.       . Multiple Vitamins-Minerals (HAIR/SKIN/NAILS PO) Take 1 tablet by mouth daily.      . ondansetron (ZOFRAN-ODT) 8 MG disintegrating tablet Take 1 tablet (8 mg total) by mouth every 8 (eight) hours as needed for nausea or vomiting.  20 tablet  3  . prochlorperazine (COMPAZINE) 10 MG tablet Take 1 tablet (10 mg total) by mouth every 6 (six) hours as needed for nausea or vomiting.  60 tablet  0  . ranitidine (ZANTAC) 300 MG tablet Take 1 tablet (300 mg total) by mouth at bedtime. For acid reflux and indigestion  30 tablet  99  . sucralfate (CARAFATE) 1 G tablet Take 1 tablet (1 g total) by mouth 4 (four) times daily. Dissolve in water and swallow  120 tablet  99  . Umeclidinium-Vilanterol (ANORO ELLIPTA) 62.5-25 MCG/INH AEPB Use twice daily  60 each  3  . UNABLE TO FIND anora inhaler, 1 puff daily      . zolpidem (AMBIEN) 5 MG tablet Take 1 tablet (5 mg total) by mouth at bedtime as needed for sleep.  30 tablet  0  . nitroGLYCERIN  (NITROSTAT) 0.4 MG SL tablet Place 1 tablet (0.4 mg total) under the tongue every 5 (five) minutes as needed for chest pain.  25 tablet  3   No current facility-administered medications for this visit.   Facility-Administered Medications Ordered in Other Visits  Medication Dose Route Frequency Provider Last Rate Last Dose  . carfilzomib (KYPROLIS) 70 mg in dextrose 5 % 50 mL chemo infusion  36 mg/m2 (Treatment Plan Actual) Intravenous Once Petar Mucci E  Duante Arocho, PA-C 170 mL/hr at 03/17/13 1639 70 mg at 03/17/13 1639  . cyclophosphamide (CYTOXAN) 580 mg in sodium chloride 0.9 % 250 mL chemo infusion  300 mg/m2 (Treatment Plan Actual) Intravenous Once Milford Cilento E Giovoni Bunch, PA-C      . heparin lock flush 100 unit/mL  500 Units Intracatheter Once PRN Phoebie Shad E Anum Palecek, PA-C      . sodium chloride 0.9 % injection 10 mL  10 mL Intracatheter PRN Curt Bears, MD   10 mL at 01/05/13 1825  . sodium chloride 0.9 % injection 10 mL  10 mL Intracatheter PRN Carlton Adam, PA-C        SURGICAL HISTORY:  Past Surgical History  Procedure Laterality Date  . History of port removal    . Status post stem cell transplant on September 28, 2008.    Marland Kitchen Abdominal hysterectomy  1981  . Cholecystectomy  1971  . Mastectomy Left 2008  . Cataract extraction, bilateral      REVIEW OF SYSTEMS:  Constitutional: positive for fatigue Eyes: negative Ears, nose, mouth, throat, and face: negative Respiratory: negative Cardiovascular: negative Gastrointestinal: positive for constipation Genitourinary:negative Integument/breast: negative Hematologic/lymphatic: negative Musculoskeletal:negative Neurological: negative Behavioral/Psych: negative Endocrine: negative Allergic/Immunologic: negative   PHYSICAL EXAMINATION: General appearance: alert, cooperative and no distress Head: Normocephalic, without obvious abnormality, atraumatic Neck: no adenopathy Lymph nodes: Cervical, supraclavicular, and axillary nodes  normal. Resp: clear to auscultation bilaterally Back: symmetric, no curvature. ROM normal. No CVA tenderness. Cardio: regular rate and rhythm, S1, S2 normal, no murmur, click, rub or gallop GI: soft, non-tender; bowel sounds normal; no masses,  no organomegaly Extremities: extremities normal, atraumatic, no cyanosis or edema Neurologic: Alert and oriented X 3, normal strength and tone. Normal symmetric reflexes. Normal coordination and gait  ECOG PERFORMANCE STATUS: 1 - Symptomatic but completely ambulatory  Blood pressure 123/60, pulse 74, temperature 98.8 F (37.1 C), temperature source Oral, resp. rate 18, height _0  (1.651 m), weight 187 lb 9.6 oz (85.095 kg), SpO2 99.00%.  LABORATORY DATA: Lab Results  Component Value Date   WBC 6.9 03/17/2013   HGB 8.1* 03/17/2013   HCT 25.1* 03/17/2013   MCV 105.9* 03/17/2013   PLT 209 03/17/2013      Chemistry      Component Value Date/Time   NA 143 03/17/2013 1244   NA 140 02/24/2013 1708   K 4.1 03/17/2013 1244   K 4.5 02/24/2013 1708   CL 110 02/24/2013 1708   CL 105 03/19/2012 0811   CO2 22 03/17/2013 1244   CO2 20 02/24/2013 1708   BUN 23.6 03/17/2013 1244   BUN 26* 02/24/2013 1708   CREATININE 2.6* 03/17/2013 1244   CREATININE 2.45* 02/24/2013 1708   CREATININE 2.56* 01/14/2013 0625      Component Value Date/Time   CALCIUM 9.4 03/17/2013 1244   CALCIUM 8.8 02/24/2013 1708   ALKPHOS 116 03/17/2013 1244   ALKPHOS 170* 02/24/2013 1708   AST 19 03/17/2013 1244   AST 38* 02/24/2013 1708   ALT 18 03/17/2013 1244   ALT 34 02/24/2013 1708   BILITOT 0.59 03/17/2013 1244   BILITOT 0.9 02/24/2013 1708     Other lab results: IgG 622, IgA 14, IgM 17, free kappa light chain 1.60, free lambda light chain 0.19 with the kappa/lambda ratio of 8.42.  RADIOGRAPHIC STUDIES:  ASSESSMENT AND PLAN: This is a very pleasant 73 years old white female with recurrent multiple myeloma as well as history of ductal carcinoma in situ. The patient is currently  on treatment with  Carfilzomib, Cytoxan and dexamethasone status post 2 cycles with significant improvement in her myeloma panel. She also completed daily use 1 in June of cycle #3. She missed day 8, 9, 15 and 16 due to the inclement weather. Patient was discussed with also seen by Dr. Julien Nordmann. She will proceed with day 1 of cycle #4 today and we will add a fifth cycle after which we will repeat her protein studies. If her protein studies have improved and are stabilized after cycle #5 which in effect will be a completion of a total of four complete cycles, she will be given a break for approximately 2 months before checking another set her protein studies to reevaluate her disease. The patient is in agreement with this plan. She would come back for followup visit in 2 weeks for evaluation and management any adverse effect of her treatment For hypercalcemia, the patient will continue on monthly Zometa. For the renal insufficiency, she was advised to continue followup with Dr. Jimmy Footman as scheduled and to increase her by mouth hydration.  She was advised to call immediately she has any concerning symptoms in the interval.  She was advised to call immediately if she has any concerning symptoms in the interval. All questions were answered. The patient knows to call the clinic with any problems, questions or concerns. We can certainly see the patient much sooner if necessary.  Carlton Adam, PA-C  ADDENDUM: Hematology/Oncology:  I had the face to face encounter with the patient. I recommended her care plan. This is a very pleasant 73 years old white female with relapsed multiple myeloma. She is currently undergoing systemic chemotherapy with Carfilzomib, Cytoxan and dexamethasone status post 2 cycles. She missed the last 2 weeks of her treatment with cycle #3 secondary to the with her condition. The patient is doing fine today and I recommended for her to continue her current treatment as scheduled. She would come back  for follow up visit in 2 weeks for reevaluation. She was advised to call immediately if she has any concerning symptoms in the interval.  Disclaimer: This note was dictated with voice recognition software. Similar sounding words can inadvertently be transcribed and may not be corrected upon review. [name]., MD 03/21/2013

## 2013-03-17 NOTE — Progress Notes (Signed)
OK to treat with hgb 8.1 per Dr. Julien Nordmann  1614: patient reports feeling "shaky and cold and nauseous." she has eaten today and her v/s are WNL. Pt has been afebrile at home. Notified Adrena, [place] at 1418. She will discuss with Dr. Julien Nordmann and f/u.  1625: patient reports her symptoms have resolved. Awilda Metro, PA aware. She verbalized an order for prn nausea, see MAR. Patient did receive this dose.

## 2013-03-17 NOTE — Patient Instructions (Signed)
[facility] Discharge Instructions for Patients Receiving Chemotherapy  Today you received the following chemotherapy agents kyprolis, cytoxan  To help prevent nausea and vomiting after your treatment, we encourage you to take your nausea medication as needed   If you develop nausea and vomiting that is not controlled by your nausea medication, call the clinic.   BELOW ARE SYMPTOMS THAT SHOULD BE REPORTED IMMEDIATELY:  *FEVER GREATER THAN 100.5 F  *CHILLS WITH OR WITHOUT FEVER  NAUSEA AND VOMITING THAT IS NOT CONTROLLED WITH YOUR NAUSEA MEDICATION  *UNUSUAL SHORTNESS OF BREATH  *UNUSUAL BRUISING OR BLEEDING  TENDERNESS IN MOUTH AND THROAT WITH OR WITHOUT PRESENCE OF ULCERS  *URINARY PROBLEMS  *BOWEL PROBLEMS  UNUSUAL RASH Items with * indicate a potential emergency and should be followed up as soon as possible.  Feel free to call the clinic you have any questions or concerns. The clinic phone number is [phone].

## 2013-03-17 NOTE — Telephone Encounter (Signed)
Per staff message and POF I have scheduled appts.  JMW  

## 2013-03-18 ENCOUNTER — Ambulatory Visit (HOSPITAL_BASED_OUTPATIENT_CLINIC_OR_DEPARTMENT_OTHER): Payer: Medicare Other

## 2013-03-18 ENCOUNTER — Telehealth: Payer: Self-pay | Admitting: Internal Medicine

## 2013-03-18 ENCOUNTER — Other Ambulatory Visit: Payer: Self-pay | Admitting: *Deleted

## 2013-03-18 VITALS — BP 110/57 | HR 96 | Temp 97.0°F | Resp 18

## 2013-03-18 DIAGNOSIS — Z853 Personal history of malignant neoplasm of breast: Secondary | ICD-10-CM

## 2013-03-18 DIAGNOSIS — Z5112 Encounter for antineoplastic immunotherapy: Secondary | ICD-10-CM

## 2013-03-18 DIAGNOSIS — C9001 Multiple myeloma in remission: Secondary | ICD-10-CM

## 2013-03-18 DIAGNOSIS — C9002 Multiple myeloma in relapse: Secondary | ICD-10-CM

## 2013-03-18 MED ORDER — DEXAMETHASONE SODIUM PHOSPHATE 10 MG/ML IJ SOLN
INTRAMUSCULAR | Status: AC
  Filled 2013-03-18: qty 1

## 2013-03-18 MED ORDER — CARFILZOMIB CHEMO INJECTION 60 MG
36.0000 mg/m2 | Freq: Once | INTRAVENOUS | Status: AC
  Administered 2013-03-18: 70 mg via INTRAVENOUS
  Filled 2013-03-18: qty 35

## 2013-03-18 MED ORDER — SODIUM CHLORIDE 0.9 % IV SOLN: Freq: Once | INTRAVENOUS | Status: DC

## 2013-03-18 MED ORDER — SODIUM CHLORIDE 0.9 % IV SOLN
3.0000 mg | Freq: Once | INTRAVENOUS | Status: AC
  Administered 2013-03-18: 3 mg via INTRAVENOUS
  Filled 2013-03-18: qty 3.75

## 2013-03-18 MED ORDER — HEPARIN SOD (PORK) LOCK FLUSH 100 UNIT/ML IV SOLN
500.0000 [IU] | Freq: Once | INTRAVENOUS | Status: AC | PRN
  Administered 2013-03-18: 500 [IU]
  Filled 2013-03-18: qty 5

## 2013-03-18 MED ORDER — ONDANSETRON 8 MG/50ML IVPB (CHCC)
8.0000 mg | Freq: Once | INTRAVENOUS | Status: AC
  Administered 2013-03-18: 8 mg via INTRAVENOUS

## 2013-03-18 MED ORDER — SODIUM CHLORIDE 0.9 % IV SOLN
Freq: Once | INTRAVENOUS | Status: AC
  Administered 2013-03-18: 13:00:00 via INTRAVENOUS

## 2013-03-18 MED ORDER — DEXAMETHASONE SODIUM PHOSPHATE 10 MG/ML IJ SOLN
10.0000 mg | Freq: Once | INTRAMUSCULAR | Status: AC
  Administered 2013-03-18: 10 mg via INTRAVENOUS

## 2013-03-18 MED ORDER — SODIUM CHLORIDE 0.9 % IJ SOLN
10.0000 mL | INTRAMUSCULAR | Status: DC | PRN
  Administered 2013-03-18: 10 mL
  Filled 2013-03-18: qty 10

## 2013-03-18 MED ORDER — ONDANSETRON 8 MG/NS 50 ML IVPB
INTRAVENOUS | Status: AC
  Filled 2013-03-18: qty 8

## 2013-03-18 NOTE — Telephone Encounter (Signed)
Called pt and left message for chemo next week , advise pt to get appt calendar

## 2013-03-18 NOTE — Patient Instructions (Signed)
[facility] Discharge Instructions for Patients Receiving Chemotherapy  Today you received the following chemotherapy agents kyprolis/zometa.   To help prevent nausea and vomiting after your treatment, we encourage you to take your nausea medication as directed.    If you develop nausea and vomiting that is not controlled by your nausea medication, call the clinic.   BELOW ARE SYMPTOMS THAT SHOULD BE REPORTED IMMEDIATELY:  *FEVER GREATER THAN 100.5 F  *CHILLS WITH OR WITHOUT FEVER  NAUSEA AND VOMITING THAT IS NOT CONTROLLED WITH YOUR NAUSEA MEDICATION  *UNUSUAL SHORTNESS OF BREATH  *UNUSUAL BRUISING OR BLEEDING  TENDERNESS IN MOUTH AND THROAT WITH OR WITHOUT PRESENCE OF ULCERS  *URINARY PROBLEMS  *BOWEL PROBLEMS  UNUSUAL RASH Items with * indicate a potential emergency and should be followed up as soon as possible.  Feel free to call the clinic you have any questions or concerns. The clinic phone number is [phone].

## 2013-03-18 NOTE — Patient Instructions (Signed)
Follow-up in 2 weeks

## 2013-03-18 NOTE — Telephone Encounter (Signed)
pt came in and needed appt fixed done.

## 2013-03-24 ENCOUNTER — Encounter: Payer: Self-pay | Admitting: Internal Medicine

## 2013-03-24 ENCOUNTER — Ambulatory Visit (INDEPENDENT_AMBULATORY_CARE_PROVIDER_SITE_OTHER): Payer: Medicare Other | Admitting: Internal Medicine

## 2013-03-24 ENCOUNTER — Other Ambulatory Visit: Payer: Self-pay | Admitting: *Deleted

## 2013-03-24 ENCOUNTER — Other Ambulatory Visit: Payer: Medicare Other

## 2013-03-24 ENCOUNTER — Telehealth: Payer: Self-pay | Admitting: *Deleted

## 2013-03-24 ENCOUNTER — Other Ambulatory Visit (HOSPITAL_BASED_OUTPATIENT_CLINIC_OR_DEPARTMENT_OTHER): Payer: Medicare Other

## 2013-03-24 ENCOUNTER — Ambulatory Visit (HOSPITAL_BASED_OUTPATIENT_CLINIC_OR_DEPARTMENT_OTHER): Payer: Medicare Other

## 2013-03-24 VITALS — BP 106/64 | HR 80 | Temp 98.1°F | Resp 16 | Ht 65.0 in | Wt 191.6 lb

## 2013-03-24 VITALS — BP 100/46 | HR 79 | Temp 98.3°F | Resp 18

## 2013-03-24 DIAGNOSIS — Z5111 Encounter for antineoplastic chemotherapy: Secondary | ICD-10-CM

## 2013-03-24 DIAGNOSIS — C9002 Multiple myeloma in relapse: Secondary | ICD-10-CM

## 2013-03-24 DIAGNOSIS — J019 Acute sinusitis, unspecified: Secondary | ICD-10-CM

## 2013-03-24 DIAGNOSIS — Z1212 Encounter for screening for malignant neoplasm of rectum: Secondary | ICD-10-CM

## 2013-03-24 DIAGNOSIS — C9001 Multiple myeloma in remission: Secondary | ICD-10-CM

## 2013-03-24 DIAGNOSIS — Z5112 Encounter for antineoplastic immunotherapy: Secondary | ICD-10-CM

## 2013-03-24 DIAGNOSIS — K219 Gastro-esophageal reflux disease without esophagitis: Secondary | ICD-10-CM

## 2013-03-24 DIAGNOSIS — E559 Vitamin D deficiency, unspecified: Secondary | ICD-10-CM | POA: Insufficient documentation

## 2013-03-24 LAB — CBC WITH DIFFERENTIAL/PLATELET
BASO%: 0.3 % (ref 0.0–2.0)
Basophils Absolute: 0 10*3/uL (ref 0.0–0.1)
EOS%: 5 % (ref 0.0–7.0)
Eosinophils Absolute: 0.4 10*3/uL (ref 0.0–0.5)
HCT: 25 % — ABNORMAL LOW (ref 34.8–46.6)
HGB: 8.1 g/dL — ABNORMAL LOW (ref 11.6–15.9)
LYMPH%: 24.1 % (ref 14.0–49.7)
MCH: 34.8 pg — ABNORMAL HIGH (ref 25.1–34.0)
MCHC: 32.4 g/dL (ref 31.5–36.0)
MCV: 107.3 fL — ABNORMAL HIGH (ref 79.5–101.0)
MONO#: 1 10*3/uL — ABNORMAL HIGH (ref 0.1–0.9)
MONO%: 14.5 % — ABNORMAL HIGH (ref 0.0–14.0)
NEUT#: 4 10*3/uL (ref 1.5–6.5)
NEUT%: 56.1 % (ref 38.4–76.8)
Platelets: 126 10*3/uL — ABNORMAL LOW (ref 145–400)
RBC: 2.33 10*6/uL — AB (ref 3.70–5.45)
RDW: 14.5 % (ref 11.2–14.5)
WBC: 7.2 10*3/uL (ref 3.9–10.3)
lymph#: 1.7 10*3/uL (ref 0.9–3.3)

## 2013-03-24 LAB — COMPREHENSIVE METABOLIC PANEL (CC13)
ALBUMIN: 3.5 g/dL (ref 3.5–5.0)
ALK PHOS: 145 U/L (ref 40–150)
ALT: 36 U/L (ref 0–55)
ANION GAP: 9 meq/L (ref 3–11)
AST: 43 U/L — ABNORMAL HIGH (ref 5–34)
BILIRUBIN TOTAL: 0.57 mg/dL (ref 0.20–1.20)
BUN: 25.8 mg/dL (ref 7.0–26.0)
CO2: 20 meq/L — AB (ref 22–29)
Calcium: 8.6 mg/dL (ref 8.4–10.4)
Chloride: 113 mEq/L — ABNORMAL HIGH (ref 98–109)
Creatinine: 2.6 mg/dL — ABNORMAL HIGH (ref 0.6–1.1)
Glucose: 94 mg/dl (ref 70–140)
POTASSIUM: 4.3 meq/L (ref 3.5–5.1)
SODIUM: 141 meq/L (ref 136–145)
Total Protein: 5.9 g/dL — ABNORMAL LOW (ref 6.4–8.3)

## 2013-03-24 LAB — POC HEMOCCULT BLD/STL (HOME/3-CARD/SCREEN)
Card #2 Fecal Occult Blod, POC: NEGATIVE
Card #3 Fecal Occult Blood, POC: NEGATIVE
FECAL OCCULT BLD: NEGATIVE

## 2013-03-24 MED ORDER — AZITHROMYCIN 250 MG PO TABS: ORAL_TABLET | ORAL | Status: DC

## 2013-03-24 MED ORDER — HEPARIN SOD (PORK) LOCK FLUSH 100 UNIT/ML IV SOLN
500.0000 [IU] | Freq: Once | INTRAVENOUS | Status: AC | PRN
  Administered 2013-03-24: 500 [IU]
  Filled 2013-03-24: qty 5

## 2013-03-24 MED ORDER — CYCLOPHOSPHAMIDE CHEMO INJECTION 1 GM
300.0000 mg/m2 | Freq: Once | INTRAMUSCULAR | Status: AC
  Administered 2013-03-24: 580 mg via INTRAVENOUS
  Filled 2013-03-24: qty 29

## 2013-03-24 MED ORDER — MORPHINE SULFATE 15 MG PO TABS: 15.0000 mg | ORAL_TABLET | Freq: Four times a day (QID) | ORAL | Status: DC | PRN

## 2013-03-24 MED ORDER — OXYCODONE-ACETAMINOPHEN 5-325 MG PO TABS: 1.0000 | ORAL_TABLET | ORAL | Status: DC

## 2013-03-24 MED ORDER — TRAMADOL HCL 50 MG PO TABS: 50.0000 mg | ORAL_TABLET | Freq: Four times a day (QID) | ORAL | Status: DC | PRN

## 2013-03-24 MED ORDER — OXYCODONE-ACETAMINOPHEN 5-325 MG PO TABS
ORAL_TABLET | ORAL | Status: AC
  Filled 2013-03-24: qty 1

## 2013-03-24 MED ORDER — ONDANSETRON 8 MG/50ML IVPB (CHCC)
8.0000 mg | Freq: Once | INTRAVENOUS | Status: AC
  Administered 2013-03-24: 8 mg via INTRAVENOUS

## 2013-03-24 MED ORDER — DEXAMETHASONE SODIUM PHOSPHATE 10 MG/ML IJ SOLN
INTRAMUSCULAR | Status: AC
  Filled 2013-03-24: qty 1

## 2013-03-24 MED ORDER — OXYCODONE-ACETAMINOPHEN 5-325 MG PO TABS: 1.0000 | ORAL_TABLET | ORAL | Status: DC | PRN

## 2013-03-24 MED ORDER — ONDANSETRON 8 MG/NS 50 ML IVPB
INTRAVENOUS | Status: AC
  Filled 2013-03-24: qty 8

## 2013-03-24 MED ORDER — SODIUM CHLORIDE 0.9 % IV SOLN
Freq: Once | INTRAVENOUS | Status: AC
  Administered 2013-03-24: 09:00:00 via INTRAVENOUS

## 2013-03-24 MED ORDER — ACETAMINOPHEN 325 MG PO TABS: 650.0000 mg | ORAL_TABLET | ORAL | Status: DC

## 2013-03-24 MED ORDER — CARFILZOMIB CHEMO INJECTION 60 MG
36.0000 mg/m2 | Freq: Once | INTRAVENOUS | Status: AC
  Administered 2013-03-24: 70 mg via INTRAVENOUS
  Filled 2013-03-24: qty 35

## 2013-03-24 MED ORDER — SODIUM CHLORIDE 0.9 % IJ SOLN
10.0000 mL | INTRAMUSCULAR | Status: DC | PRN
  Administered 2013-03-24: 10 mL
  Filled 2013-03-24: qty 10

## 2013-03-24 MED ORDER — DEXAMETHASONE SODIUM PHOSPHATE 10 MG/ML IJ SOLN
10.0000 mg | Freq: Once | INTRAMUSCULAR | Status: AC
  Administered 2013-03-24: 10 mg via INTRAVENOUS

## 2013-03-24 NOTE — Progress Notes (Signed)
Okay to treat today per Dr. Julien Nordmann with hgb 8.1 Cindi Carbon, RN

## 2013-03-24 NOTE — Addendum Note (Signed)
Addended by: Britt Bottom on: 03/24/2013 09:59 AM   Modules accepted: Orders, Medications

## 2013-03-24 NOTE — Telephone Encounter (Signed)
Mammogram dated 03/23/13 given to Dr Vista Mink to review.  SLJ

## 2013-03-24 NOTE — Patient Instructions (Signed)
[facility] Discharge Instructions for Patients Receiving Chemotherapy  Today you received the following chemotherapy agents: Kyprolis, Cytoxan  To help prevent nausea and vomiting after your treatment, we encourage you to take your nausea medication as prescribed.   If you develop nausea and vomiting that is not controlled by your nausea medication, call the clinic.   BELOW ARE SYMPTOMS THAT SHOULD BE REPORTED IMMEDIATELY:  *FEVER GREATER THAN 100.5 F  *CHILLS WITH OR WITHOUT FEVER  NAUSEA AND VOMITING THAT IS NOT CONTROLLED WITH YOUR NAUSEA MEDICATION  *UNUSUAL SHORTNESS OF BREATH  *UNUSUAL BRUISING OR BLEEDING  TENDERNESS IN MOUTH AND THROAT WITH OR WITHOUT PRESENCE OF ULCERS  *URINARY PROBLEMS  *BOWEL PROBLEMS  UNUSUAL RASH Items with * indicate a potential emergency and should be followed up as soon as possible.  Feel free to call the clinic you have any questions or concerns. The clinic phone number is [phone].

## 2013-03-24 NOTE — Patient Instructions (Signed)
Sinusitis Sinusitis is redness, soreness, and puffiness (inflammation) of the air pockets in the bones of your face (sinuses). The redness, soreness, and puffiness can cause air and mucus to get trapped in your sinuses. This can allow germs to grow and cause an infection.  HOME CARE   Drink enough fluids to keep your pee (urine) clear or pale yellow.  Use a humidifier in your home.  Run a hot shower to create steam in the bathroom. Sit in the bathroom with the door closed. Breathe in the steam 3 4 times a day.  Put a warm, moist washcloth on your face 3 4 times a day, or as told by your doctor.  Use salt water sprays (saline sprays) to wet the thick fluid in your nose. This can help the sinuses drain.  Only take medicine as told by your doctor. GET HELP RIGHT AWAY IF:   Your pain gets worse.  You have very bad headaches.  You are sick to your stomach (nauseous).  You throw up (vomit).  You are very sleepy (drowsy) all the time.  Your face is puffy (swollen).  Your vision changes.  You have a stiff neck.  You have trouble breathing. MAKE SURE YOU:   Understand these instructions.  Will watch your condition.  Will get help right away if you are not doing well or get worse. Document Released: 06/20/2007 Document Revised: 09/26/2011 Document Reviewed: 08/07/2011 [facility] Patient Information 2014 [place].

## 2013-03-24 NOTE — Progress Notes (Signed)
   Subjective:    Patient ID: Nancy Norris, female    DOB: May 12, 1940, 73 y.o.   MRN: [id number]  HPI Patient returns for 1 month F/U of switching bid Protonix to Nexium qam & Ranitidine qhs with resolution of her Reflux Sx's , dysphagia and waterbrash.   She is c/o sinus pressure and drainage today.Ofnote she is a MM pt w/ Autologous Stem cell Graft and in [facility] relapse and currently undergoing Chemotx.  Review of Systems (+) as above - Negative otherwise.     Objective:   Physical Exam  Constitutional: She is oriented to person, place, and time. No distress.  HENT:  Right Ear: External ear normal.  Left Ear: External ear normal.  Nose: Nose normal.  Mouth/Throat: Oropharynx is clear and moist. No oropharyngeal exudate.  Slight Bilat Frontal and Maxillary tenderness.  Eyes: Conjunctivae and EOM are normal. Pupils are equal, round, and reactive to light. Right eye exhibits no discharge. Left eye exhibits no discharge.  Neck: Normal range of motion. Neck supple. No JVD present. No thyromegaly present.  Cardiovascular: Normal rate, regular rhythm and normal heart sounds.   No murmur heard. Pulmonary/Chest: Effort normal and breath sounds normal. No respiratory distress. She has no wheezes. She has no rales.  Musculoskeletal: Normal range of motion.  Lymphadenopathy:    She has no cervical adenopathy.  Neurological: She is alert and oriented to person, place, and time. No cranial nerve deficit.  Skin: Skin is warm and dry. No rash noted. She is not diaphoretic. No erythema. There is pallor.  Psychiatric: She has a normal mood and affect. Judgment and thought content normal.          Assessment & Plan:   1. GERD (gastroesophageal reflux disease) -continue same  2. Acute sinusitis, unspecified  - azithromycin (ZITHROMAX) 250 MG tablet; Take 2 tablets (500 mg) on  Day 1,  followed by 1 tablet (250 mg) once daily on Days 2 through 5.  Dispense: 6 each; Refill: 1

## 2013-03-24 NOTE — Telephone Encounter (Signed)
Pt has codeine allergy,  She is experiencing pain in her rib cage and generalized joint pain.  Per Dr Vista Mink, okay to give pt tramadol.  Pt is aware to call if any issues.  SLJ

## 2013-03-25 ENCOUNTER — Ambulatory Visit (HOSPITAL_BASED_OUTPATIENT_CLINIC_OR_DEPARTMENT_OTHER): Payer: Medicare Other

## 2013-03-25 VITALS — BP 117/69 | HR 82 | Temp 98.0°F | Resp 19

## 2013-03-25 DIAGNOSIS — Z5112 Encounter for antineoplastic immunotherapy: Secondary | ICD-10-CM

## 2013-03-25 DIAGNOSIS — C9002 Multiple myeloma in relapse: Secondary | ICD-10-CM

## 2013-03-25 MED ORDER — DEXAMETHASONE SODIUM PHOSPHATE 10 MG/ML IJ SOLN
10.0000 mg | Freq: Once | INTRAMUSCULAR | Status: AC
Start: 2013-03-25 — End: 2013-03-25
  Administered 2013-03-25: 10 mg via INTRAVENOUS

## 2013-03-25 MED ORDER — DEXAMETHASONE SODIUM PHOSPHATE 10 MG/ML IJ SOLN
INTRAMUSCULAR | Status: AC
Start: 2013-03-25 — End: 2013-03-25
  Filled 2013-03-25: qty 1

## 2013-03-25 MED ORDER — HEPARIN SOD (PORK) LOCK FLUSH 100 UNIT/ML IV SOLN
500.0000 [IU] | Freq: Once | INTRAVENOUS | Status: AC | PRN
  Administered 2013-03-25: 500 [IU]
  Filled 2013-03-25: qty 5

## 2013-03-25 MED ORDER — SODIUM CHLORIDE 0.9 % IJ SOLN
10.0000 mL | INTRAMUSCULAR | Status: DC | PRN
  Administered 2013-03-25: 10 mL
  Filled 2013-03-25: qty 10

## 2013-03-25 MED ORDER — ONDANSETRON 8 MG/50ML IVPB (CHCC)
8.0000 mg | Freq: Once | INTRAVENOUS | Status: AC
  Administered 2013-03-25: 8 mg via INTRAVENOUS

## 2013-03-25 MED ORDER — ONDANSETRON 8 MG/NS 50 ML IVPB
INTRAVENOUS | Status: AC
  Filled 2013-03-25: qty 8

## 2013-03-25 MED ORDER — SODIUM CHLORIDE 0.9 % IV SOLN
Freq: Once | INTRAVENOUS | Status: AC
  Administered 2013-03-25: 14:00:00 via INTRAVENOUS

## 2013-03-25 MED ORDER — CARFILZOMIB CHEMO INJECTION 60 MG
36.0000 mg/m2 | Freq: Once | INTRAVENOUS | Status: AC
  Administered 2013-03-25: 70 mg via INTRAVENOUS
  Filled 2013-03-25: qty 35

## 2013-03-25 NOTE — Progress Notes (Signed)
Okay to tx with hgb 8.1 per Dr. Julien Nordmann.

## 2013-03-25 NOTE — Patient Instructions (Signed)
[facility] Discharge Instructions for Patients Receiving Chemotherapy  Today you received the following chemotherapy agents: Kyprolis   To help prevent nausea and vomiting after your treatment, we encourage you to take your nausea medication as prescribed by your physician.    If you develop nausea and vomiting that is not controlled by your nausea medication, call the clinic.   BELOW ARE SYMPTOMS THAT SHOULD BE REPORTED IMMEDIATELY:  *FEVER GREATER THAN 100.5 F  *CHILLS WITH OR WITHOUT FEVER  NAUSEA AND VOMITING THAT IS NOT CONTROLLED WITH YOUR NAUSEA MEDICATION  *UNUSUAL SHORTNESS OF BREATH  *UNUSUAL BRUISING OR BLEEDING  TENDERNESS IN MOUTH AND THROAT WITH OR WITHOUT PRESENCE OF ULCERS  *URINARY PROBLEMS  *BOWEL PROBLEMS  UNUSUAL RASH Items with * indicate a potential emergency and should be followed up as soon as possible.  Feel free to call the clinic you have any questions or concerns. The clinic phone number is [phone].

## 2013-03-31 ENCOUNTER — Other Ambulatory Visit (HOSPITAL_BASED_OUTPATIENT_CLINIC_OR_DEPARTMENT_OTHER): Payer: Medicare Other

## 2013-03-31 ENCOUNTER — Telehealth: Payer: Self-pay | Admitting: *Deleted

## 2013-03-31 ENCOUNTER — Ambulatory Visit (HOSPITAL_BASED_OUTPATIENT_CLINIC_OR_DEPARTMENT_OTHER): Payer: Medicare Other

## 2013-03-31 VITALS — BP 117/72 | HR 69 | Temp 98.4°F

## 2013-03-31 DIAGNOSIS — C9002 Multiple myeloma in relapse: Secondary | ICD-10-CM

## 2013-03-31 DIAGNOSIS — Z5112 Encounter for antineoplastic immunotherapy: Secondary | ICD-10-CM

## 2013-03-31 DIAGNOSIS — Z5111 Encounter for antineoplastic chemotherapy: Secondary | ICD-10-CM

## 2013-03-31 LAB — COMPREHENSIVE METABOLIC PANEL (CC13)
ALBUMIN: 3.7 g/dL (ref 3.5–5.0)
ALK PHOS: 122 U/L (ref 40–150)
ALT: 31 U/L (ref 0–55)
AST: 24 U/L (ref 5–34)
Anion Gap: 9 mEq/L (ref 3–11)
BUN: 29.4 mg/dL — AB (ref 7.0–26.0)
CALCIUM: 9.3 mg/dL (ref 8.4–10.4)
CHLORIDE: 111 meq/L — AB (ref 98–109)
CO2: 21 mEq/L — ABNORMAL LOW (ref 22–29)
Creatinine: 3 mg/dL (ref 0.6–1.1)
Glucose: 89 mg/dl (ref 70–140)
POTASSIUM: 4.1 meq/L (ref 3.5–5.1)
SODIUM: 141 meq/L (ref 136–145)
TOTAL PROTEIN: 6.2 g/dL — AB (ref 6.4–8.3)
Total Bilirubin: 0.55 mg/dL (ref 0.20–1.20)

## 2013-03-31 LAB — CBC WITH DIFFERENTIAL/PLATELET
BASO%: 0.6 % (ref 0.0–2.0)
Basophils Absolute: 0 10*3/uL (ref 0.0–0.1)
EOS%: 4.4 % (ref 0.0–7.0)
Eosinophils Absolute: 0.2 10*3/uL (ref 0.0–0.5)
HCT: 24.4 % — ABNORMAL LOW (ref 34.8–46.6)
HGB: 8 g/dL — ABNORMAL LOW (ref 11.6–15.9)
LYMPH#: 1.4 10*3/uL (ref 0.9–3.3)
LYMPH%: 27.7 % (ref 14.0–49.7)
MCH: 34.5 pg — ABNORMAL HIGH (ref 25.1–34.0)
MCHC: 32.8 g/dL (ref 31.5–36.0)
MCV: 105.2 fL — ABNORMAL HIGH (ref 79.5–101.0)
MONO#: 0.8 10*3/uL (ref 0.1–0.9)
MONO%: 16.2 % — ABNORMAL HIGH (ref 0.0–14.0)
NEUT#: 2.6 10*3/uL (ref 1.5–6.5)
NEUT%: 51.1 % (ref 38.4–76.8)
Platelets: 153 10*3/uL (ref 145–400)
RBC: 2.32 10*6/uL — ABNORMAL LOW (ref 3.70–5.45)
RDW: 13.4 % (ref 11.2–14.5)
WBC: 5 10*3/uL (ref 3.9–10.3)
nRBC: 0 % (ref 0–0)

## 2013-03-31 MED ORDER — DEXAMETHASONE SODIUM PHOSPHATE 10 MG/ML IJ SOLN
10.0000 mg | Freq: Once | INTRAMUSCULAR | Status: AC
  Administered 2013-03-31: 10 mg via INTRAVENOUS

## 2013-03-31 MED ORDER — HEPARIN SOD (PORK) LOCK FLUSH 100 UNIT/ML IV SOLN
500.0000 [IU] | Freq: Once | INTRAVENOUS | Status: AC | PRN
  Administered 2013-03-31: 500 [IU]
  Filled 2013-03-31: qty 5

## 2013-03-31 MED ORDER — SODIUM CHLORIDE 0.9 % IJ SOLN
10.0000 mL | INTRAMUSCULAR | Status: DC | PRN
Start: 2013-03-31 — End: 2013-03-31
  Administered 2013-03-31: 10 mL
  Filled 2013-03-31: qty 10

## 2013-03-31 MED ORDER — CYCLOPHOSPHAMIDE CHEMO INJECTION 1 GM
300.0000 mg/m2 | Freq: Once | INTRAMUSCULAR | Status: AC
  Administered 2013-03-31: 580 mg via INTRAVENOUS
  Filled 2013-03-31: qty 29

## 2013-03-31 MED ORDER — SODIUM CHLORIDE 0.9 % IV SOLN
Freq: Once | INTRAVENOUS | Status: AC
  Administered 2013-03-31: 15:00:00 via INTRAVENOUS

## 2013-03-31 MED ORDER — ONDANSETRON 8 MG/NS 50 ML IVPB
INTRAVENOUS | Status: AC
  Filled 2013-03-31: qty 8

## 2013-03-31 MED ORDER — DEXTROSE 5 % IV SOLN
36.0000 mg/m2 | Freq: Once | INTRAVENOUS | Status: AC
  Administered 2013-03-31: 70 mg via INTRAVENOUS
  Filled 2013-03-31: qty 35

## 2013-03-31 MED ORDER — DEXAMETHASONE SODIUM PHOSPHATE 10 MG/ML IJ SOLN
INTRAMUSCULAR | Status: AC
  Filled 2013-03-31: qty 1

## 2013-03-31 MED ORDER — ONDANSETRON 8 MG/50ML IVPB (CHCC)
8.0000 mg | Freq: Once | INTRAVENOUS | Status: AC
  Administered 2013-03-31: 8 mg via INTRAVENOUS

## 2013-03-31 MED ORDER — SODIUM CHLORIDE 0.9 % IV SOLN
Freq: Once | INTRAVENOUS | Status: AC
  Administered 2013-03-31: 14:00:00 via INTRAVENOUS

## 2013-03-31 NOTE — Patient Instructions (Signed)
[place] Discharge Instructions for Patients Receiving Chemotherapy  Today you received the following chemotherapy agents Kyprolis.  To help prevent nausea and vomiting after your treatment, we encourage you to take your nausea medication as prescribed.   If you develop nausea and vomiting that is not controlled by your nausea medication, call the clinic.   BELOW ARE SYMPTOMS THAT SHOULD BE REPORTED IMMEDIATELY:  *FEVER GREATER THAN 100.5 F  *CHILLS WITH OR WITHOUT FEVER  NAUSEA AND VOMITING THAT IS NOT CONTROLLED WITH YOUR NAUSEA MEDICATION  *UNUSUAL SHORTNESS OF BREATH  *UNUSUAL BRUISING OR BLEEDING  TENDERNESS IN MOUTH AND THROAT WITH OR WITHOUT PRESENCE OF ULCERS  *URINARY PROBLEMS  *BOWEL PROBLEMS  UNUSUAL RASH Items with * indicate a potential emergency and should be followed up as soon as possible.  Feel free to call the clinic you have any questions or concerns. The clinic phone number is [phone].

## 2013-03-31 NOTE — Telephone Encounter (Signed)
Cr 3.0, per AJ, need to fax over report to Dr Archie Balboa.  Lab work routed through [facility] to Dr Archie Balboa. SLJ

## 2013-04-01 ENCOUNTER — Ambulatory Visit (HOSPITAL_BASED_OUTPATIENT_CLINIC_OR_DEPARTMENT_OTHER): Payer: Medicare Other

## 2013-04-01 ENCOUNTER — Ambulatory Visit: Payer: Medicare Other

## 2013-04-01 ENCOUNTER — Ambulatory Visit (HOSPITAL_COMMUNITY)
Admission: RE | Admit: 2013-04-01 | Discharge: 2013-04-01 | Disposition: A | Discharge status: Home / Self Care | Payer: Medicare Other | Source: Ambulatory Visit | Attending: Internal Medicine | Admitting: Internal Medicine

## 2013-04-01 ENCOUNTER — Other Ambulatory Visit: Payer: Self-pay | Admitting: Medical Oncology

## 2013-04-01 VITALS — BP 135/73 | HR 69 | Temp 98.8°F | Resp 18

## 2013-04-01 DIAGNOSIS — D649 Anemia, unspecified: Secondary | ICD-10-CM | POA: Insufficient documentation

## 2013-04-01 DIAGNOSIS — C9002 Multiple myeloma in relapse: Secondary | ICD-10-CM

## 2013-04-01 DIAGNOSIS — Z5112 Encounter for antineoplastic immunotherapy: Secondary | ICD-10-CM

## 2013-04-01 LAB — PREPARE RBC (CROSSMATCH)

## 2013-04-01 MED ORDER — HEPARIN SOD (PORK) LOCK FLUSH 100 UNIT/ML IV SOLN
500.0000 [IU] | Freq: Once | INTRAVENOUS | Status: AC | PRN
  Filled 2013-04-01: qty 5

## 2013-04-01 MED ORDER — SODIUM CHLORIDE 0.9 % IV SOLN
Freq: Once | INTRAVENOUS | Status: AC
  Administered 2013-04-01: 13:00:00 via INTRAVENOUS

## 2013-04-01 MED ORDER — ACETAMINOPHEN 325 MG PO TABS
ORAL_TABLET | ORAL | Status: AC
  Filled 2013-04-01: qty 1

## 2013-04-01 MED ORDER — DIPHENHYDRAMINE HCL 25 MG PO CAPS
ORAL_CAPSULE | ORAL | Status: AC
  Filled 2013-04-01: qty 1

## 2013-04-01 MED ORDER — ACETAMINOPHEN 325 MG PO TABS
650.0000 mg | ORAL_TABLET | Freq: Once | ORAL | Status: AC
  Administered 2013-04-01: 650 mg via ORAL

## 2013-04-01 MED ORDER — SODIUM CHLORIDE 0.9 % IV SOLN: Freq: Once | INTRAVENOUS | Status: DC

## 2013-04-01 MED ORDER — DEXAMETHASONE SODIUM PHOSPHATE 10 MG/ML IJ SOLN
INTRAMUSCULAR | Status: AC
  Filled 2013-04-01: qty 1

## 2013-04-01 MED ORDER — SODIUM CHLORIDE 0.9 % IJ SOLN
10.0000 mL | INTRAMUSCULAR | Status: DC | PRN
  Filled 2013-04-01: qty 10

## 2013-04-01 MED ORDER — DEXTROSE 5 % IV SOLN
36.0000 mg/m2 | Freq: Once | INTRAVENOUS | Status: AC
  Administered 2013-04-01: 70 mg via INTRAVENOUS
  Filled 2013-04-01: qty 35

## 2013-04-01 MED ORDER — ONDANSETRON 8 MG/50ML IVPB (CHCC)
8.0000 mg | Freq: Once | INTRAVENOUS | Status: AC
  Administered 2013-04-01: 8 mg via INTRAVENOUS

## 2013-04-01 MED ORDER — ONDANSETRON 8 MG/NS 50 ML IVPB
INTRAVENOUS | Status: AC
  Filled 2013-04-01: qty 8

## 2013-04-01 MED ORDER — DIPHENHYDRAMINE HCL 25 MG PO CAPS
25.0000 mg | ORAL_CAPSULE | Freq: Once | ORAL | Status: AC
  Administered 2013-04-01: 25 mg via ORAL

## 2013-04-01 MED ORDER — ACETAMINOPHEN 325 MG PO TABS
ORAL_TABLET | ORAL | Status: AC
  Filled 2013-04-01: qty 2

## 2013-04-01 MED ORDER — DEXAMETHASONE SODIUM PHOSPHATE 10 MG/ML IJ SOLN
10.0000 mg | Freq: Once | INTRAMUSCULAR | Status: AC
  Administered 2013-04-01: 10 mg via INTRAVENOUS

## 2013-04-01 NOTE — Progress Notes (Signed)
1805 Blood transfusion flowsheet "stopped working"  Would not allow to chart rate changes.  1805 VSS Blood continued @ 185 cc/hr for second hour of transfusion.  Pt tolerating well and no reaction noted.   1850 Second unit completed; VSS no reaction noted.

## 2013-04-01 NOTE — Patient Instructions (Signed)
Blood Transfusion Information WHAT IS A BLOOD TRANSFUSION? A transfusion is the replacement of blood or some of its parts. Blood is made up of multiple cells which provide different functions.  Red blood cells carry oxygen and are used for blood loss replacement.  White blood cells fight against infection.  Platelets control bleeding.  Plasma helps clot blood.  Other blood products are available for specialized needs, such as hemophilia or other clotting disorders. BEFORE THE TRANSFUSION  Who gives blood for transfusions?   You may be able to donate blood to be used at a later date on yourself (autologous donation).  Relatives can be asked to donate blood. This is generally not any safer than if you have received blood from a stranger. The same precautions are taken to ensure safety when a relative's blood is donated.  Healthy volunteers who are fully evaluated to make sure their blood is safe. This is blood bank blood. Transfusion therapy is the safest it has ever been in the practice of medicine. Before blood is taken from a donor, a complete history is taken to make sure that person has no history of diseases nor engages in risky social behavior (examples are intravenous drug use or sexual activity with multiple partners). The donor's travel history is screened to minimize risk of transmitting infections, such as malaria. The donated blood is tested for signs of infectious diseases, such as HIV and hepatitis. The blood is then tested to be sure it is compatible with you in order to minimize the chance of a transfusion reaction. If you or a relative donates blood, this is often done in anticipation of surgery and is not appropriate for emergency situations. It takes many days to process the donated blood. [place] Discharge Instructions for Patients Receiving Chemotherapy  Today you received the following chemotherapy agents Kyprolis  To help  prevent nausea and vomiting after your treatment, we encourage you to take your nausea medication Zoran and Compazine as directed.   If you develop nausea and vomiting that is not controlled by your nausea medication, call the clinic.   BELOW ARE SYMPTOMS THAT SHOULD BE REPORTED IMMEDIATELY:  *FEVER GREATER THAN 100.5 F  *CHILLS WITH OR WITHOUT FEVER  NAUSEA AND VOMITING THAT IS NOT CONTROLLED WITH YOUR NAUSEA MEDICATION  *UNUSUAL SHORTNESS OF BREATH  *UNUSUAL BRUISING OR BLEEDING  TENDERNESS IN MOUTH AND THROAT WITH OR WITHOUT PRESENCE OF ULCERS  *URINARY PROBLEMS  *BOWEL PROBLEMS  UNUSUAL RASH Items with * indicate a potential emergency and should be followed up as soon as possible.  Feel free to call the clinic you have any questions or concerns. The clinic phone number is [phone].    Although transfusion therapy is very safe and saves many lives, the main dangers of transfusion include:   Getting an infectious disease.  Developing a transfusion reaction. This is an allergic reaction to something in the blood you were given. Every precaution is taken to prevent this. The decision to have a blood transfusion has been considered carefully by your caregiver before blood is given. Blood is not given unless the benefits outweigh the risks. AFTER THE TRANSFUSION  Right after receiving a blood transfusion, you will usually feel much better and more energetic. This is especially true if your red blood cells have gotten low (anemic). The transfusion raises the level of the red blood cells which carry oxygen, and this usually causes an energy increase.  The nurse administering  the transfusion will monitor you carefully for complications. HOME CARE INSTRUCTIONS  No special instructions are needed after a transfusion. You may find your energy is better. Speak with your caregiver about any limitations on activity for underlying diseases you may have. SEEK MEDICAL CARE IF:    Your condition is not improving after your transfusion.  You develop redness or irritation at the intravenous (IV) site. SEEK IMMEDIATE MEDICAL CARE IF:  Any of the following symptoms occur over the next 12 hours:  Shaking chills.  You have a temperature by mouth above 102 F (38.9 C), not controlled by medicine.  Chest, back, or muscle pain.  People around you feel you are not acting correctly or are confused.  Shortness of breath or difficulty breathing.  Dizziness and fainting.  You get a rash or develop hives.  You have a decrease in urine output.  Your urine turns a dark color or changes to pink, red, or brown. Any of the following symptoms occur over the next 10 days:  You have a temperature by mouth above 102 F (38.9 C), not controlled by medicine.  Shortness of breath.  Weakness after normal activity.  The white part of the eye turns yellow (jaundice).  You have a decrease in the amount of urine or are urinating less often.  Your urine turns a dark color or changes to pink, red, or brown. Document Released: 12/30/1999 Document Revised: 03/26/2011 Document Reviewed: 08/18/2007 [facility] Patient Information 2014 [place].

## 2013-04-01 NOTE — Patient Instructions (Signed)
[place] Discharge Instructions for Patients Receiving Chemotherapy  Today you received the following chemotherapy agents kyprolis.    To help prevent nausea and vomiting after your treatment, we encourage you to take your nausea medication as directed.     If you develop nausea and vomiting that is not controlled by your nausea medication, call the clinic.   BELOW ARE SYMPTOMS THAT SHOULD BE REPORTED IMMEDIATELY:  *FEVER GREATER THAN 100.5 F  *CHILLS WITH OR WITHOUT FEVER  NAUSEA AND VOMITING THAT IS NOT CONTROLLED WITH YOUR NAUSEA MEDICATION  *UNUSUAL SHORTNESS OF BREATH  *UNUSUAL BRUISING OR BLEEDING  TENDERNESS IN MOUTH AND THROAT WITH OR WITHOUT PRESENCE OF ULCERS  *URINARY PROBLEMS  *BOWEL PROBLEMS  UNUSUAL RASH Items with * indicate a potential emergency and should be followed up as soon as possible.  Feel free to call the clinic you have any questions or concerns. The clinic phone number is [phone].   Blood Transfusion  A blood transfusion replaces your blood or some of its parts. Blood is replaced when you have lost blood because of surgery, an accident, or for severe blood conditions like anemia. You can donate blood to be used on yourself if you have a planned surgery. If you lose blood during that surgery, your own blood can be given back to you. Any blood given to you is checked to make sure it matches your blood type. Your temperature, blood pressure, and heart rate (vital signs) will be checked often.  GET HELP RIGHT AWAY IF:   You feel sick to your stomach (nauseous) or throw up (vomit).  You have watery poop (diarrhea).  You have shortness of breath or trouble breathing.  You have blood in your pee (urine) or have dark colored pee.  You have chest pain or tightness.  Your eyes or skin turn yellow (jaundice).  You have a temperature by mouth above 102 F (38.9 C), not controlled by medicine.  You start to shake and have  chills.  You develop a a red rash (hives) or feel itchy.  You develop lightheadedness or feel confused.  You develop back, joint, or muscle pain.  You do not feel hungry (lost appetite).  You feel tired, restless, or nervous.  You develop belly (abdominal) cramps. Document Released: 03/30/2008 Document Revised: 03/26/2011 Document Reviewed: 03/30/2008 [facility] Patient Information 2014 [place], [place].

## 2013-04-01 NOTE — Progress Notes (Signed)
Har called for.

## 2013-04-02 ENCOUNTER — Telehealth: Payer: Self-pay | Admitting: Internal Medicine

## 2013-04-02 LAB — TYPE AND SCREEN
ABO/RH(D): O POS
Antibody Screen: NEGATIVE
UNIT DIVISION: 0
Unit division: 0

## 2013-04-02 NOTE — Telephone Encounter (Signed)
pt called to cx lab per MD saying she dont need it....returned pt call and confirmed cancellation

## 2013-04-03 ENCOUNTER — Other Ambulatory Visit (HOSPITAL_COMMUNITY): Payer: Self-pay | Admitting: Gastroenterology

## 2013-04-03 ENCOUNTER — Other Ambulatory Visit: Payer: Self-pay | Admitting: Gastroenterology

## 2013-04-03 DIAGNOSIS — R110 Nausea: Secondary | ICD-10-CM

## 2013-04-07 ENCOUNTER — Other Ambulatory Visit: Payer: Medicare Other

## 2013-04-14 ENCOUNTER — Ambulatory Visit (HOSPITAL_BASED_OUTPATIENT_CLINIC_OR_DEPARTMENT_OTHER): Payer: Medicare Other | Admitting: Internal Medicine

## 2013-04-14 ENCOUNTER — Telehealth: Payer: Self-pay | Admitting: Internal Medicine

## 2013-04-14 ENCOUNTER — Ambulatory Visit (HOSPITAL_BASED_OUTPATIENT_CLINIC_OR_DEPARTMENT_OTHER): Payer: Medicare Other

## 2013-04-14 ENCOUNTER — Other Ambulatory Visit (HOSPITAL_BASED_OUTPATIENT_CLINIC_OR_DEPARTMENT_OTHER): Payer: Medicare Other

## 2013-04-14 ENCOUNTER — Encounter: Payer: Self-pay | Admitting: Internal Medicine

## 2013-04-14 VITALS — BP 127/72 | HR 73 | Temp 98.5°F | Resp 20 | Ht 65.0 in | Wt 182.8 lb

## 2013-04-14 DIAGNOSIS — Z5111 Encounter for antineoplastic chemotherapy: Secondary | ICD-10-CM

## 2013-04-14 DIAGNOSIS — C9002 Multiple myeloma in relapse: Secondary | ICD-10-CM

## 2013-04-14 DIAGNOSIS — R110 Nausea: Secondary | ICD-10-CM

## 2013-04-14 DIAGNOSIS — Z5112 Encounter for antineoplastic immunotherapy: Secondary | ICD-10-CM

## 2013-04-14 LAB — CBC WITH DIFFERENTIAL/PLATELET
BASO%: 0.8 % (ref 0.0–2.0)
Basophils Absolute: 0.1 10*3/uL (ref 0.0–0.1)
EOS%: 3.7 % (ref 0.0–7.0)
Eosinophils Absolute: 0.2 10*3/uL (ref 0.0–0.5)
HCT: 35.3 % (ref 34.8–46.6)
HGB: 11.8 g/dL (ref 11.6–15.9)
LYMPH#: 1.5 10*3/uL (ref 0.9–3.3)
LYMPH%: 25.8 % (ref 14.0–49.7)
MCH: 33.1 pg (ref 25.1–34.0)
MCHC: 33.4 g/dL (ref 31.5–36.0)
MCV: 99.2 fL (ref 79.5–101.0)
MONO#: 0.9 10*3/uL (ref 0.1–0.9)
MONO%: 15 % — ABNORMAL HIGH (ref 0.0–14.0)
NEUT#: 3.2 10*3/uL (ref 1.5–6.5)
NEUT%: 54.7 % (ref 38.4–76.8)
Platelets: 201 10*3/uL (ref 145–400)
RBC: 3.56 10*6/uL — ABNORMAL LOW (ref 3.70–5.45)
RDW: 16 % — AB (ref 11.2–14.5)
WBC: 5.9 10*3/uL (ref 3.9–10.3)

## 2013-04-14 LAB — COMPREHENSIVE METABOLIC PANEL (CC13)
ALT: 22 U/L (ref 0–55)
AST: 22 U/L (ref 5–34)
Albumin: 4 g/dL (ref 3.5–5.0)
Alkaline Phosphatase: 136 U/L (ref 40–150)
Anion Gap: 11 mEq/L (ref 3–11)
BUN: 24.7 mg/dL (ref 7.0–26.0)
CALCIUM: 9.1 mg/dL (ref 8.4–10.4)
CHLORIDE: 111 meq/L — AB (ref 98–109)
CO2: 19 mEq/L — ABNORMAL LOW (ref 22–29)
CREATININE: 2.9 mg/dL — AB (ref 0.6–1.1)
Glucose: 88 mg/dl (ref 70–140)
Potassium: 3.8 mEq/L (ref 3.5–5.1)
Sodium: 141 mEq/L (ref 136–145)
Total Bilirubin: 0.59 mg/dL (ref 0.20–1.20)
Total Protein: 6.5 g/dL (ref 6.4–8.3)

## 2013-04-14 MED ORDER — DEXTROSE 5 % IV SOLN
36.0000 mg/m2 | Freq: Once | INTRAVENOUS | Status: AC
  Administered 2013-04-14: 70 mg via INTRAVENOUS
  Filled 2013-04-14: qty 35

## 2013-04-14 MED ORDER — ONDANSETRON 8 MG/NS 50 ML IVPB
INTRAVENOUS | Status: AC
  Filled 2013-04-14: qty 8

## 2013-04-14 MED ORDER — SODIUM CHLORIDE 0.9 % IV SOLN
Freq: Once | INTRAVENOUS | Status: AC
  Administered 2013-04-14: 15:00:00 via INTRAVENOUS

## 2013-04-14 MED ORDER — ONDANSETRON 8 MG/50ML IVPB (CHCC)
8.0000 mg | Freq: Once | INTRAVENOUS | Status: AC
  Administered 2013-04-14: 8 mg via INTRAVENOUS

## 2013-04-14 MED ORDER — DEXAMETHASONE SODIUM PHOSPHATE 10 MG/ML IJ SOLN
10.0000 mg | Freq: Once | INTRAMUSCULAR | Status: AC
  Administered 2013-04-14: 10 mg via INTRAVENOUS

## 2013-04-14 MED ORDER — CYCLOPHOSPHAMIDE CHEMO INJECTION 1 GM
300.0000 mg/m2 | Freq: Once | INTRAMUSCULAR | Status: AC
  Administered 2013-04-14: 580 mg via INTRAVENOUS
  Filled 2013-04-14: qty 29

## 2013-04-14 MED ORDER — SODIUM CHLORIDE 0.9 % IJ SOLN
10.0000 mL | INTRAMUSCULAR | Status: DC | PRN
  Administered 2013-04-14: 10 mL
  Filled 2013-04-14: qty 10

## 2013-04-14 MED ORDER — DEXAMETHASONE SODIUM PHOSPHATE 10 MG/ML IJ SOLN
INTRAMUSCULAR | Status: AC
  Filled 2013-04-14: qty 1

## 2013-04-14 MED ORDER — HEPARIN SOD (PORK) LOCK FLUSH 100 UNIT/ML IV SOLN
500.0000 [IU] | Freq: Once | INTRAVENOUS | Status: AC | PRN
  Administered 2013-04-14: 500 [IU]
  Filled 2013-04-14: qty 5

## 2013-04-14 NOTE — Patient Instructions (Signed)
[place] Discharge Instructions for Patients Receiving Chemotherapy  Today you received the following chemotherapy agents: Kyprolis and Cytoxan   To help prevent nausea and vomiting after your treatment, we encourage you to take your nausea medication as prescribed.    If you develop nausea and vomiting that is not controlled by your nausea medication, call the clinic.   BELOW ARE SYMPTOMS THAT SHOULD BE REPORTED IMMEDIATELY:  *FEVER GREATER THAN 100.5 F  *CHILLS WITH OR WITHOUT FEVER  NAUSEA AND VOMITING THAT IS NOT CONTROLLED WITH YOUR NAUSEA MEDICATION  *UNUSUAL SHORTNESS OF BREATH  *UNUSUAL BRUISING OR BLEEDING  TENDERNESS IN MOUTH AND THROAT WITH OR WITHOUT PRESENCE OF ULCERS  *URINARY PROBLEMS  *BOWEL PROBLEMS  UNUSUAL RASH Items with * indicate a potential emergency and should be followed up as soon as possible.  Feel free to call the clinic you have any questions or concerns. The clinic phone number is [phone].

## 2013-04-14 NOTE — Progress Notes (Signed)
[place]  Telephone:([phone]) [phone] [phone]) [phone]  OFFICE PROGRESS NOTE   PRINCIPAL DIAGNOSES:  1. Recurrent multiple myeloma initially diagnosed in 2002, initially with smoldering myeloma at [facility].  2. Ductal carcinoma in situ status post mastectomy with sentinel lymph node biopsy in October 2008.  PRIOR THERAPY:  1. Status post treatment with tamoxifen from November 2008 through February 2009, discontinued secondary to intolerance. 2. Status post 3 cycles of chemotherapy with Revlimid and Decadron followed by 1 cycle of Decadron only with mild response. 3. Status post 2 cycles of systemic chemotherapy with Velcade, Doxil and Decadron discontinued secondary to significant peripheral neuropathy. Last dose was given May 2010 at [facility]. 4. Status post autologous peripheral blood stem cell transplant on October 01, 2008 at [facility] under the care of Dr. Phyllis Ginger.  CURRENT THERAPY: Systemic chemotherapy with Carfilzomib initially was 20 mg/M2 and will be increased after cycle #1 to 36 mg/M2 on days 1, 2, 8, 9, 15 and 16 every 4 weeks in addition to Cytoxan 300 mg/M2 and Decadron 40 mg by mouth weekly basis, status post 3 cycles. First cycle on 12/29/2012.   INTERVAL HISTORY: Nancy Norris 73 y.o. female returns to the clinic today for followup visit. The patient is feeling fine today with no specific complaints except for fatigue and intermittent nausea. She took Zofran earlier today and feeling a little bit better. She is tolerating her treatment with Carfilzomib, dexamethasone and Cytoxan fairly well. The patient denied having any significant weight loss or night sweats. She denied having any chest pain, shortness of breath, cough or hemoptysis. She has no nausea or vomiting. She has no peripheral neuropathy.  She is here today to start cycle #4 of her chemotherapy.  MEDICAL HISTORY: Past Medical History  Diagnosis Date    . Hyperkalemia   . Hypothyroidism   . COPD (chronic obstructive pulmonary disease)   . Hyponatremia   . Dizziness   . Fibromyalgia   . Breast cancer   . Multiple myeloma   . Mucositis   . CKD (chronic kidney disease) stage 3, GFR 30-59 ml/min   . Hx of echocardiogram     a.  Echocardiogram (12/26/2012): EF 79-39%, grade 1 diastolic dysfunction;   b.  Echocardiogram (02/2013): EF 55-60%, no WMA, trivial effusion  . Fibromyalgia   . Arthritis   . Hx of cardiovascular stress test     LexiScan with low level exercise Myoview (02/2013): No ischemia, EF 72%; normal study    ALLERGIES:  is allergic to codeine; latex; iodinated diagnostic agents; other; sulfa antibiotics; and adhesive.  MEDICATIONS:  Current Outpatient Prescriptions  Medication Sig Dispense Refill  . acetaminophen (TYLENOL) 325 MG tablet Take 650 mg by mouth every 6 (six) hours as needed for mild pain or moderate pain.       Marland Kitchen albuterol (PROVENTIL HFA;VENTOLIN HFA) 108 (90 BASE) MCG/ACT inhaler Inhale 2 puffs into the lungs 2 (two) times daily as needed for wheezing or shortness of breath.      . Alum & Mag Hydroxide-Simeth (MAGIC MOUTHWASH W/LIDOCAINE) SOLN Take 5 mLs by mouth 4 (four) times daily as needed for mouth pain (swish and spit QID PRN).  240 mL  0  . ascorbic acid (VITAMIN C) 250 MG CHEW Chew 250 mg by mouth daily.      Marland Kitchen aspirin EC 81 MG tablet Take 1 tablet (81 mg total) by mouth daily.      Marland Kitchen buPROPion (WELLBUTRIN XL)  150 MG 24 hr tablet Take 1 tablet (150 mg total) by mouth every morning. For mood and depression  30 tablet  99  . carvedilol (COREG) 3.125 MG tablet       . cetirizine (ZYRTEC) 10 MG tablet Take 10 mg by mouth at bedtime.       . cholecalciferol (VITAMIN D) 1000 UNITS tablet Take 4,000 Units by mouth daily.      . citalopram (CELEXA) 40 MG tablet TAKE 1 TABLET BY MOUTH ONCE DAILY.  90 tablet  0  . cromolyn (OPTICROM) 4 % ophthalmic solution Place 1 drop into both eyes 4 (four) times daily.       . cyanocobalamin 2000 MCG tablet Take 2,000 mcg by mouth daily.       . dexamethasone (DECADRON) 4 MG tablet       . esomeprazole (NEXIUM) 40 MG capsule Take 1 capsule (40 mg total) by mouth daily. Each morning for acid indigestion  30 capsule  99  . Ferrous Sulfate 134 MG TABS Take 1 tablet by mouth daily.       . fluticasone (FLONASE) 50 MCG/ACT nasal spray       . gabapentin (NEURONTIN) 300 MG capsule Take 300 mg by mouth at bedtime.      . isosorbide mononitrate (IMDUR) 30 MG 24 hr tablet       . levothyroxine (SYNTHROID, LEVOTHROID) 150 MCG tablet TAKE 1 TABLET BY MOUTH ONCE DAILY.  1 1/2 tablet on saturday and sunday      . lidocaine-prilocaine (EMLA) cream Apply 1 application topically as needed. Apply to port 1 hour before chemotherapy appt.  30 g  0  . loteprednol (LOTEMAX) 0.2 % SUSP Place 1 drop into both eyes 3 (three) times daily.       . Multiple Vitamins-Minerals (HAIR/SKIN/NAILS PO) Take 1 tablet by mouth daily.      . nitroGLYCERIN (NITROSTAT) 0.4 MG SL tablet Place 1 tablet (0.4 mg total) under the tongue every 5 (five) minutes as needed for chest pain.  25 tablet  3  . ondansetron (ZOFRAN-ODT) 8 MG disintegrating tablet Take 1 tablet (8 mg total) by mouth every 8 (eight) hours as needed for nausea or vomiting.  20 tablet  3  . prochlorperazine (COMPAZINE) 10 MG tablet Take 1 tablet (10 mg total) by mouth every 6 (six) hours as needed for nausea or vomiting.  60 tablet  0  . ranitidine (ZANTAC) 300 MG tablet Take 1 tablet (300 mg total) by mouth at bedtime. For acid reflux and indigestion  30 tablet  99  . sucralfate (CARAFATE) 1 G tablet Take 1 tablet (1 g total) by mouth 4 (four) times daily. Dissolve in water and swallow  120 tablet  99  . traMADol (ULTRAM) 50 MG tablet Take 1 tablet (50 mg total) by mouth every 6 (six) hours as needed.  30 tablet  0  . Umeclidinium-Vilanterol 62.5-25 MCG/INH AEPB Use daily      . zolpidem (AMBIEN) 5 MG tablet Take 1 tablet (5 mg total) by  mouth at bedtime as needed for sleep.  30 tablet  0   No current facility-administered medications for this visit.   Facility-Administered Medications Ordered in Other Visits  Medication Dose Route Frequency Provider Last Rate Last Dose  . 0.9 %  sodium chloride infusion   Intravenous Once Adrena E Johnson, PA-C      . sodium chloride 0.9 % injection 10 mL  10 mL Intracatheter PRN  , MD     10 mL at 01/05/13 1825  . sodium chloride 0.9 % injection 10 mL  10 mL Intracatheter PRN Carlton Adam, PA-C        SURGICAL HISTORY:  Past Surgical History  Procedure Laterality Date  . History of port removal    . Status post stem cell transplant on September 28, 2008.    Marland Kitchen Abdominal hysterectomy  1981  . Cholecystectomy  1971  . Mastectomy Left 2008  . Cataract extraction, bilateral      REVIEW OF SYSTEMS:  Constitutional: positive for fatigue Eyes: negative Ears, nose, mouth, throat, and face: negative Respiratory: negative Cardiovascular: negative Gastrointestinal: positive for nausea Genitourinary:negative Integument/breast: negative Hematologic/lymphatic: negative Musculoskeletal:negative Neurological: negative Behavioral/Psych: negative Endocrine: negative Allergic/Immunologic: negative   PHYSICAL EXAMINATION: General appearance: alert, cooperative and no distress Head: Normocephalic, without obvious abnormality, atraumatic Neck: no adenopathy Lymph nodes: Cervical, supraclavicular, and axillary nodes normal. Resp: clear to auscultation bilaterally Back: symmetric, no curvature. ROM normal. No CVA tenderness. Cardio: regular rate and rhythm, S1, S2 normal, no murmur, click, rub or gallop GI: soft, non-tender; bowel sounds normal; no masses,  no organomegaly Extremities: extremities normal, atraumatic, no cyanosis or edema Neurologic: Alert and oriented X 3, normal strength and tone. Normal symmetric reflexes. Normal coordination and gait  ECOG PERFORMANCE  STATUS: 1 - Symptomatic but completely ambulatory  Blood pressure 127/72, pulse 73, temperature 98.5 F (36.9 C), temperature source Oral, resp. rate 20, height 5' 5" (1.651 m), weight 182 lb 12.8 oz (82.918 kg), SpO2 98.00%.  LABORATORY DATA: Lab Results  Component Value Date   WBC 5.9 04/14/2013   HGB 11.8 04/14/2013   HCT 35.3 04/14/2013   MCV 99.2 04/14/2013   PLT 201 04/14/2013      Chemistry      Component Value Date/Time   NA 141 03/31/2013 1336   NA 140 02/24/2013 1708   K 4.1 03/31/2013 1336   K 4.5 02/24/2013 1708   CL 110 02/24/2013 1708   CL 105 03/19/2012 0811   CO2 21* 03/31/2013 1336   CO2 20 02/24/2013 1708   BUN 29.4* 03/31/2013 1336   BUN 26* 02/24/2013 1708   CREATININE 3.0* 03/31/2013 1336   CREATININE 2.45* 02/24/2013 1708   CREATININE 2.56* 01/14/2013 0625      Component Value Date/Time   CALCIUM 9.3 03/31/2013 1336   CALCIUM 8.8 02/24/2013 1708   ALKPHOS 122 03/31/2013 1336   ALKPHOS 170* 02/24/2013 1708   AST 24 03/31/2013 1336   AST 38* 02/24/2013 1708   ALT 31 03/31/2013 1336   ALT 34 02/24/2013 1708   BILITOT 0.55 03/31/2013 1336   BILITOT 0.9 02/24/2013 1708       RADIOGRAPHIC STUDIES:  ASSESSMENT AND PLAN: This is a very pleasant 73 years old white female with relapsed multiple myeloma. She is currently undergoing systemic chemotherapy with Carfilzomib, Cytoxan and dexamethasone status post 3 cycles. She is tolerating her treatment fairly well with no significant adverse effects except for intermittent nausea. The patient is doing fine today and I recommended for her to continue her current treatment as scheduled. She will start cycle #4 today.  She would come back for follow up visit in 4 weeks for reevaluation after repeating myeloma panel for evaluation of her disease. She was advised to call immediately if she has any concerning symptoms in the interval.  Disclaimer: This note was dictated with voice recognition software. Similar sounding words can  inadvertently be transcribed and may not be corrected upon review. Latanja Lehenbauer K.,  MD 04/14/2013

## 2013-04-14 NOTE — Telephone Encounter (Signed)
gv adn printed appt sched and avs for pt for April  °

## 2013-04-14 NOTE — Progress Notes (Signed)
Dr. Julien Nordmann notified of creatinine 2.9 today. OK to proceed with chemotherapy per MD.

## 2013-04-15 ENCOUNTER — Ambulatory Visit (HOSPITAL_BASED_OUTPATIENT_CLINIC_OR_DEPARTMENT_OTHER): Payer: Medicare Other

## 2013-04-15 VITALS — BP 129/65 | HR 79 | Temp 98.1°F | Resp 18

## 2013-04-15 DIAGNOSIS — Z5112 Encounter for antineoplastic immunotherapy: Secondary | ICD-10-CM

## 2013-04-15 DIAGNOSIS — C9002 Multiple myeloma in relapse: Secondary | ICD-10-CM

## 2013-04-15 MED ORDER — ONDANSETRON 8 MG/NS 50 ML IVPB
INTRAVENOUS | Status: AC
  Filled 2013-04-15: qty 8

## 2013-04-15 MED ORDER — DEXAMETHASONE SODIUM PHOSPHATE 10 MG/ML IJ SOLN
10.0000 mg | Freq: Once | INTRAMUSCULAR | Status: AC
  Administered 2013-04-15: 10 mg via INTRAVENOUS

## 2013-04-15 MED ORDER — ONDANSETRON 8 MG/50ML IVPB (CHCC)
8.0000 mg | Freq: Once | INTRAVENOUS | Status: AC
  Administered 2013-04-15: 8 mg via INTRAVENOUS

## 2013-04-15 MED ORDER — DEXAMETHASONE SODIUM PHOSPHATE 10 MG/ML IJ SOLN
INTRAMUSCULAR | Status: AC
  Filled 2013-04-15: qty 1

## 2013-04-15 MED ORDER — DEXTROSE 5 % IV SOLN
36.0000 mg/m2 | Freq: Once | INTRAVENOUS | Status: AC
  Administered 2013-04-15: 70 mg via INTRAVENOUS
  Filled 2013-04-15: qty 35

## 2013-04-15 MED ORDER — SODIUM CHLORIDE 0.9 % IJ SOLN
10.0000 mL | INTRAMUSCULAR | Status: DC | PRN
  Administered 2013-04-15: 10 mL via INTRAVENOUS
  Filled 2013-04-15: qty 10

## 2013-04-15 MED ORDER — HEPARIN SOD (PORK) LOCK FLUSH 100 UNIT/ML IV SOLN
500.0000 [IU] | Freq: Once | INTRAVENOUS | Status: AC
  Administered 2013-04-15: 500 [IU] via INTRAVENOUS
  Filled 2013-04-15: qty 5

## 2013-04-15 MED ORDER — DEXTROSE 5 % IV SOLN: 36.0000 mg/m2 | Freq: Once | INTRAVENOUS | Status: DC

## 2013-04-16 ENCOUNTER — Ambulatory Visit
Admission: RE | Admit: 2013-04-16 | Discharge: 2013-04-16 | Disposition: A | Discharge status: Home / Self Care | Payer: Medicare Other | Source: Ambulatory Visit | Attending: Gastroenterology | Admitting: Gastroenterology

## 2013-04-16 ENCOUNTER — Encounter: Payer: Self-pay | Admitting: *Deleted

## 2013-04-16 DIAGNOSIS — R110 Nausea: Secondary | ICD-10-CM

## 2013-04-20 ENCOUNTER — Encounter (HOSPITAL_COMMUNITY)
Admission: RE | Admit: 2013-04-20 | Discharge: 2013-04-20 | Disposition: A | Discharge status: Home / Self Care | Payer: Medicare Other | Source: Ambulatory Visit | Attending: Gastroenterology | Admitting: Gastroenterology

## 2013-04-20 DIAGNOSIS — R110 Nausea: Secondary | ICD-10-CM

## 2013-04-20 MED ORDER — TECHNETIUM TC 99M SULFUR COLLOID
2.2000 | Freq: Once | INTRAVENOUS | Status: AC | PRN
  Administered 2013-04-20: 2.2 via INTRAVENOUS

## 2013-04-21 ENCOUNTER — Other Ambulatory Visit: Payer: Self-pay

## 2013-04-21 ENCOUNTER — Other Ambulatory Visit: Payer: Self-pay | Admitting: *Deleted

## 2013-04-21 ENCOUNTER — Ambulatory Visit (HOSPITAL_BASED_OUTPATIENT_CLINIC_OR_DEPARTMENT_OTHER): Payer: Medicare Other

## 2013-04-21 ENCOUNTER — Other Ambulatory Visit: Payer: Self-pay | Admitting: Internal Medicine

## 2013-04-21 ENCOUNTER — Other Ambulatory Visit (HOSPITAL_BASED_OUTPATIENT_CLINIC_OR_DEPARTMENT_OTHER): Payer: Medicare Other

## 2013-04-21 VITALS — BP 114/71 | HR 72 | Temp 97.3°F

## 2013-04-21 DIAGNOSIS — C9002 Multiple myeloma in relapse: Secondary | ICD-10-CM

## 2013-04-21 DIAGNOSIS — C9001 Multiple myeloma in remission: Secondary | ICD-10-CM

## 2013-04-21 DIAGNOSIS — Z5112 Encounter for antineoplastic immunotherapy: Secondary | ICD-10-CM

## 2013-04-21 DIAGNOSIS — Z5111 Encounter for antineoplastic chemotherapy: Secondary | ICD-10-CM

## 2013-04-21 LAB — CBC WITH DIFFERENTIAL/PLATELET
BASO%: 0.5 % (ref 0.0–2.0)
BASOS ABS: 0 10*3/uL (ref 0.0–0.1)
EOS ABS: 0.3 10*3/uL (ref 0.0–0.5)
EOS%: 4.5 % (ref 0.0–7.0)
HCT: 34.2 % — ABNORMAL LOW (ref 34.8–46.6)
HEMOGLOBIN: 11.5 g/dL — AB (ref 11.6–15.9)
LYMPH%: 18.2 % (ref 14.0–49.7)
MCH: 33.2 pg (ref 25.1–34.0)
MCHC: 33.6 g/dL (ref 31.5–36.0)
MCV: 98.8 fL (ref 79.5–101.0)
MONO#: 1.3 10*3/uL — AB (ref 0.1–0.9)
MONO%: 22.2 % — ABNORMAL HIGH (ref 0.0–14.0)
NEUT#: 3.1 10*3/uL (ref 1.5–6.5)
NEUT%: 54.6 % (ref 38.4–76.8)
PLATELETS: 103 10*3/uL — AB (ref 145–400)
RBC: 3.46 10*6/uL — ABNORMAL LOW (ref 3.70–5.45)
RDW: 15.4 % — ABNORMAL HIGH (ref 11.2–14.5)
WBC: 5.8 10*3/uL (ref 3.9–10.3)
lymph#: 1.1 10*3/uL (ref 0.9–3.3)

## 2013-04-21 LAB — COMPREHENSIVE METABOLIC PANEL (CC13)
ALT: 21 U/L (ref 0–55)
ANION GAP: 9 meq/L (ref 3–11)
AST: 22 U/L (ref 5–34)
Albumin: 3.8 g/dL (ref 3.5–5.0)
Alkaline Phosphatase: 136 U/L (ref 40–150)
BUN: 30 mg/dL — ABNORMAL HIGH (ref 7.0–26.0)
CALCIUM: 10.3 mg/dL (ref 8.4–10.4)
CHLORIDE: 110 meq/L — AB (ref 98–109)
CO2: 25 meq/L (ref 22–29)
CREATININE: 3.3 mg/dL — AB (ref 0.6–1.1)
GLUCOSE: 87 mg/dL (ref 70–140)
Potassium: 4 mEq/L (ref 3.5–5.1)
Sodium: 144 mEq/L (ref 136–145)
Total Bilirubin: 0.45 mg/dL (ref 0.20–1.20)
Total Protein: 6.3 g/dL — ABNORMAL LOW (ref 6.4–8.3)

## 2013-04-21 MED ORDER — SODIUM CHLORIDE 0.9 % IV SOLN
Freq: Once | INTRAVENOUS | Status: AC
  Administered 2013-04-21: 15:00:00 via INTRAVENOUS

## 2013-04-21 MED ORDER — ONDANSETRON 8 MG/NS 50 ML IVPB
INTRAVENOUS | Status: AC
  Filled 2013-04-21: qty 8

## 2013-04-21 MED ORDER — DEXAMETHASONE SODIUM PHOSPHATE 10 MG/ML IJ SOLN
10.0000 mg | Freq: Once | INTRAMUSCULAR | Status: AC
  Administered 2013-04-21: 10 mg via INTRAVENOUS

## 2013-04-21 MED ORDER — ONDANSETRON 8 MG/50ML IVPB (CHCC)
8.0000 mg | Freq: Once | INTRAVENOUS | Status: AC
  Administered 2013-04-21: 8 mg via INTRAVENOUS

## 2013-04-21 MED ORDER — SODIUM CHLORIDE 0.9 % IV SOLN
3.0000 mg | Freq: Once | INTRAVENOUS | Status: AC
Start: 2013-04-21 — End: 2013-04-21
  Administered 2013-04-21: 3 mg via INTRAVENOUS
  Filled 2013-04-21: qty 3.75

## 2013-04-21 MED ORDER — SODIUM CHLORIDE 0.9 % IV SOLN
300.0000 mg/m2 | Freq: Once | INTRAVENOUS | Status: AC
  Administered 2013-04-21: 580 mg via INTRAVENOUS
  Filled 2013-04-21: qty 29

## 2013-04-21 MED ORDER — SODIUM CHLORIDE 0.9 % IJ SOLN
10.0000 mL | INTRAMUSCULAR | Status: DC | PRN
  Administered 2013-04-21: 10 mL
  Filled 2013-04-21: qty 10

## 2013-04-21 MED ORDER — SODIUM CHLORIDE 0.9 % IV SOLN: Freq: Once | INTRAVENOUS | Status: DC

## 2013-04-21 MED ORDER — DEXTROSE 5 % IV SOLN
36.0000 mg/m2 | Freq: Once | INTRAVENOUS | Status: AC
  Administered 2013-04-21: 70 mg via INTRAVENOUS
  Filled 2013-04-21: qty 35

## 2013-04-21 MED ORDER — HEPARIN SOD (PORK) LOCK FLUSH 100 UNIT/ML IV SOLN
500.0000 [IU] | Freq: Once | INTRAVENOUS | Status: AC | PRN
  Administered 2013-04-21: 500 [IU]
  Filled 2013-04-21: qty 5

## 2013-04-21 MED ORDER — DEXAMETHASONE SODIUM PHOSPHATE 10 MG/ML IJ SOLN
INTRAMUSCULAR | Status: AC
  Filled 2013-04-21: qty 1

## 2013-04-21 NOTE — Patient Instructions (Signed)
[place] Discharge Instructions for Patients Receiving Chemotherapy  Today you received the following chemotherapy agents zometa/kyprolis/cytoxan.    To help prevent nausea and vomiting after your treatment, we encourage you to take your nausea medication as directed.     If you develop nausea and vomiting that is not controlled by your nausea medication, call the clinic.   BELOW ARE SYMPTOMS THAT SHOULD BE REPORTED IMMEDIATELY:  *FEVER GREATER THAN 100.5 F  *CHILLS WITH OR WITHOUT FEVER  NAUSEA AND VOMITING THAT IS NOT CONTROLLED WITH YOUR NAUSEA MEDICATION  *UNUSUAL SHORTNESS OF BREATH  *UNUSUAL BRUISING OR BLEEDING  TENDERNESS IN MOUTH AND THROAT WITH OR WITHOUT PRESENCE OF ULCERS  *URINARY PROBLEMS  *BOWEL PROBLEMS  UNUSUAL RASH Items with * indicate a potential emergency and should be followed up as soon as possible.  Feel free to call the clinic you have any questions or concerns. The clinic phone number is [phone].

## 2013-04-21 NOTE — Progress Notes (Signed)
Ok to treat today per Dr. Julien Nordmann.  Creatinine 3.3

## 2013-04-22 ENCOUNTER — Ambulatory Visit (HOSPITAL_BASED_OUTPATIENT_CLINIC_OR_DEPARTMENT_OTHER): Payer: Medicare Other

## 2013-04-22 ENCOUNTER — Other Ambulatory Visit: Payer: Self-pay

## 2013-04-22 VITALS — BP 101/67 | HR 79 | Temp 98.6°F | Resp 18

## 2013-04-22 DIAGNOSIS — C9002 Multiple myeloma in relapse: Secondary | ICD-10-CM

## 2013-04-22 DIAGNOSIS — Z5112 Encounter for antineoplastic immunotherapy: Secondary | ICD-10-CM

## 2013-04-22 MED ORDER — HEPARIN SOD (PORK) LOCK FLUSH 100 UNIT/ML IV SOLN
500.0000 [IU] | Freq: Once | INTRAVENOUS | Status: AC | PRN
  Administered 2013-04-22: 500 [IU]
  Filled 2013-04-22: qty 5

## 2013-04-22 MED ORDER — DEXAMETHASONE SODIUM PHOSPHATE 10 MG/ML IJ SOLN
INTRAMUSCULAR | Status: AC
  Filled 2013-04-22: qty 1

## 2013-04-22 MED ORDER — SODIUM CHLORIDE 0.9 % IV SOLN
Freq: Once | INTRAVENOUS | Status: AC
  Administered 2013-04-22: 12:00:00 via INTRAVENOUS

## 2013-04-22 MED ORDER — SODIUM CHLORIDE 0.9 % IJ SOLN
10.0000 mL | INTRAMUSCULAR | Status: DC | PRN
  Administered 2013-04-22: 10 mL
  Filled 2013-04-22: qty 10

## 2013-04-22 MED ORDER — ONDANSETRON 8 MG/50ML IVPB (CHCC)
8.0000 mg | Freq: Once | INTRAVENOUS | Status: AC
Start: 2013-04-22 — End: 2013-04-22
  Administered 2013-04-22: 8 mg via INTRAVENOUS

## 2013-04-22 MED ORDER — DEXTROSE 5 % IV SOLN
36.0000 mg/m2 | Freq: Once | INTRAVENOUS | Status: AC
  Administered 2013-04-22: 70 mg via INTRAVENOUS
  Filled 2013-04-22: qty 35

## 2013-04-22 MED ORDER — ONDANSETRON 8 MG/NS 50 ML IVPB
INTRAVENOUS | Status: AC
  Filled 2013-04-22: qty 8

## 2013-04-22 MED ORDER — DEXAMETHASONE SODIUM PHOSPHATE 10 MG/ML IJ SOLN
10.0000 mg | Freq: Once | INTRAMUSCULAR | Status: AC
  Administered 2013-04-22: 10 mg via INTRAVENOUS

## 2013-04-22 NOTE — Patient Instructions (Signed)
[place] Discharge Instructions for Patients Receiving Chemotherapy  Today you received the following chemotherapy agents Kyprolis To help prevent nausea and vomiting after your treatment, we encourage you to take your nausea medication as prescribed.  If you develop nausea and vomiting that is not controlled by your nausea medication, call the clinic.   BELOW ARE SYMPTOMS THAT SHOULD BE REPORTED IMMEDIATELY:  *FEVER GREATER THAN 100.5 F  *CHILLS WITH OR WITHOUT FEVER  NAUSEA AND VOMITING THAT IS NOT CONTROLLED WITH YOUR NAUSEA MEDICATION  *UNUSUAL SHORTNESS OF BREATH  *UNUSUAL BRUISING OR BLEEDING  TENDERNESS IN MOUTH AND THROAT WITH OR WITHOUT PRESENCE OF ULCERS  *URINARY PROBLEMS  *BOWEL PROBLEMS  UNUSUAL RASH Items with * indicate a potential emergency and should be followed up as soon as possible.  Feel free to call the clinic you have any questions or concerns. The clinic phone number is [phone].

## 2013-04-28 ENCOUNTER — Ambulatory Visit (HOSPITAL_BASED_OUTPATIENT_CLINIC_OR_DEPARTMENT_OTHER): Payer: Medicare Other

## 2013-04-28 ENCOUNTER — Other Ambulatory Visit (HOSPITAL_BASED_OUTPATIENT_CLINIC_OR_DEPARTMENT_OTHER): Payer: Medicare Other

## 2013-04-28 VITALS — BP 108/67 | HR 74 | Temp 98.9°F | Resp 20

## 2013-04-28 DIAGNOSIS — C9002 Multiple myeloma in relapse: Secondary | ICD-10-CM

## 2013-04-28 DIAGNOSIS — Z5112 Encounter for antineoplastic immunotherapy: Secondary | ICD-10-CM

## 2013-04-28 LAB — COMPREHENSIVE METABOLIC PANEL (CC13)
ALBUMIN: 3.7 g/dL (ref 3.5–5.0)
ALT: 25 U/L (ref 0–55)
ANION GAP: 9 meq/L (ref 3–11)
AST: 27 U/L (ref 5–34)
Alkaline Phosphatase: 126 U/L (ref 40–150)
BUN: 37.6 mg/dL — AB (ref 7.0–26.0)
CHLORIDE: 109 meq/L (ref 98–109)
CO2: 24 meq/L (ref 22–29)
Calcium: 9.4 mg/dL (ref 8.4–10.4)
Glucose: 90 mg/dl (ref 70–140)
POTASSIUM: 3.9 meq/L (ref 3.5–5.1)
Sodium: 142 mEq/L (ref 136–145)
Total Bilirubin: 0.82 mg/dL (ref 0.20–1.20)
Total Protein: 6.3 g/dL — ABNORMAL LOW (ref 6.4–8.3)

## 2013-04-28 LAB — CBC WITH DIFFERENTIAL/PLATELET
BASO%: 0.5 % (ref 0.0–2.0)
Basophils Absolute: 0 10*3/uL (ref 0.0–0.1)
EOS%: 5.2 % (ref 0.0–7.0)
Eosinophils Absolute: 0.2 10*3/uL (ref 0.0–0.5)
HEMATOCRIT: 32.3 % — AB (ref 34.8–46.6)
HEMOGLOBIN: 10.8 g/dL — AB (ref 11.6–15.9)
LYMPH#: 0.9 10*3/uL (ref 0.9–3.3)
LYMPH%: 19.8 % (ref 14.0–49.7)
MCH: 33 pg (ref 25.1–34.0)
MCHC: 33.4 g/dL (ref 31.5–36.0)
MCV: 98.8 fL (ref 79.5–101.0)
MONO#: 0.8 10*3/uL (ref 0.1–0.9)
MONO%: 18.2 % — ABNORMAL HIGH (ref 0.0–14.0)
NEUT%: 56.3 % (ref 38.4–76.8)
NEUTROS ABS: 2.5 10*3/uL (ref 1.5–6.5)
Platelets: 111 10*3/uL — ABNORMAL LOW (ref 145–400)
RBC: 3.27 10*6/uL — ABNORMAL LOW (ref 3.70–5.45)
RDW: 15.3 % — AB (ref 11.2–14.5)
WBC: 4.4 10*3/uL (ref 3.9–10.3)

## 2013-04-28 MED ORDER — ONDANSETRON 8 MG/NS 50 ML IVPB
INTRAVENOUS | Status: AC
  Filled 2013-04-28: qty 8

## 2013-04-28 MED ORDER — SODIUM CHLORIDE 0.9 % IV SOLN
Freq: Once | INTRAVENOUS | Status: AC
  Administered 2013-04-28: 15:00:00 via INTRAVENOUS

## 2013-04-28 MED ORDER — ONDANSETRON 8 MG/50ML IVPB (CHCC)
8.0000 mg | Freq: Once | INTRAVENOUS | Status: AC
  Administered 2013-04-28: 8 mg via INTRAVENOUS

## 2013-04-28 MED ORDER — DEXAMETHASONE SODIUM PHOSPHATE 10 MG/ML IJ SOLN
INTRAMUSCULAR | Status: AC
Start: 2013-04-28 — End: 2013-04-28
  Filled 2013-04-28: qty 1

## 2013-04-28 MED ORDER — SODIUM CHLORIDE 0.9 % IV SOLN
Freq: Once | INTRAVENOUS | Status: AC
  Administered 2013-04-28: 14:00:00 via INTRAVENOUS

## 2013-04-28 MED ORDER — CARFILZOMIB CHEMO INJECTION 60 MG
36.0000 mg/m2 | Freq: Once | INTRAVENOUS | Status: AC
  Administered 2013-04-28: 70 mg via INTRAVENOUS
  Filled 2013-04-28: qty 35

## 2013-04-28 MED ORDER — DEXAMETHASONE SODIUM PHOSPHATE 10 MG/ML IJ SOLN
10.0000 mg | Freq: Once | INTRAMUSCULAR | Status: AC
  Administered 2013-04-28: 10 mg via INTRAVENOUS

## 2013-04-28 MED ORDER — HEPARIN SOD (PORK) LOCK FLUSH 100 UNIT/ML IV SOLN
500.0000 [IU] | Freq: Once | INTRAVENOUS | Status: AC | PRN
  Administered 2013-04-28: 500 [IU]
  Filled 2013-04-28: qty 5

## 2013-04-28 MED ORDER — SODIUM CHLORIDE 0.9 % IJ SOLN
10.0000 mL | INTRAMUSCULAR | Status: DC | PRN
  Administered 2013-04-28: 10 mL
  Filled 2013-04-28: qty 10

## 2013-04-28 MED ORDER — SODIUM CHLORIDE 0.9 % IV SOLN
300.0000 mg/m2 | Freq: Once | INTRAVENOUS | Status: AC
  Administered 2013-04-28: 580 mg via INTRAVENOUS
  Filled 2013-04-28: qty 29

## 2013-04-28 NOTE — Patient Instructions (Signed)
[place] Discharge Instructions for Patients Receiving Chemotherapy  Today you received the following chemotherapy agents CYTOXAN AND KYPROLIS  To help prevent nausea and vomiting after your treatment, we encourage you to take your nausea medication START TAKING NAUSEA MED ABOUT 730 PM AS NEEDED.   If you develop nausea and vomiting that is not controlled by your nausea medication, call the clinic.   BELOW ARE SYMPTOMS THAT SHOULD BE REPORTED IMMEDIATELY:  *FEVER GREATER THAN 100.5 F  *CHILLS WITH OR WITHOUT FEVER  NAUSEA AND VOMITING THAT IS NOT CONTROLLED WITH YOUR NAUSEA MEDICATION  *UNUSUAL SHORTNESS OF BREATH  *UNUSUAL BRUISING OR BLEEDING  TENDERNESS IN MOUTH AND THROAT WITH OR WITHOUT PRESENCE OF ULCERS  *URINARY PROBLEMS  *BOWEL PROBLEMS  UNUSUAL RASH Items with * indicate a potential emergency and should be followed up as soon as possible.  Feel free to call the clinic you have any questions or concerns. The clinic phone number is [phone].

## 2013-04-29 ENCOUNTER — Ambulatory Visit (HOSPITAL_BASED_OUTPATIENT_CLINIC_OR_DEPARTMENT_OTHER): Payer: Medicare Other

## 2013-04-29 VITALS — BP 103/57 | HR 80 | Temp 97.9°F | Resp 19

## 2013-04-29 DIAGNOSIS — C9002 Multiple myeloma in relapse: Secondary | ICD-10-CM

## 2013-04-29 DIAGNOSIS — Z5112 Encounter for antineoplastic immunotherapy: Secondary | ICD-10-CM

## 2013-04-29 MED ORDER — SODIUM CHLORIDE 0.9 % IJ SOLN
10.0000 mL | INTRAMUSCULAR | Status: DC | PRN
  Administered 2013-04-29: 10 mL
  Filled 2013-04-29: qty 10

## 2013-04-29 MED ORDER — SODIUM CHLORIDE 0.9 % IV SOLN
Freq: Once | INTRAVENOUS | Status: AC
  Administered 2013-04-29: 13:00:00 via INTRAVENOUS

## 2013-04-29 MED ORDER — DEXAMETHASONE SODIUM PHOSPHATE 10 MG/ML IJ SOLN
10.0000 mg | Freq: Once | INTRAMUSCULAR | Status: AC
  Administered 2013-04-29: 10 mg via INTRAVENOUS

## 2013-04-29 MED ORDER — DEXTROSE 5 % IV SOLN
36.0000 mg/m2 | Freq: Once | INTRAVENOUS | Status: AC
  Administered 2013-04-29: 70 mg via INTRAVENOUS
  Filled 2013-04-29: qty 35

## 2013-04-29 MED ORDER — ONDANSETRON 8 MG/NS 50 ML IVPB
INTRAVENOUS | Status: AC
  Filled 2013-04-29: qty 8

## 2013-04-29 MED ORDER — DEXAMETHASONE SODIUM PHOSPHATE 10 MG/ML IJ SOLN
INTRAMUSCULAR | Status: AC
  Filled 2013-04-29: qty 1

## 2013-04-29 MED ORDER — ONDANSETRON 8 MG/50ML IVPB (CHCC)
8.0000 mg | Freq: Once | INTRAVENOUS | Status: AC
  Administered 2013-04-29: 8 mg via INTRAVENOUS

## 2013-04-29 MED ORDER — HEPARIN SOD (PORK) LOCK FLUSH 100 UNIT/ML IV SOLN
500.0000 [IU] | Freq: Once | INTRAVENOUS | Status: AC | PRN
  Administered 2013-04-29: 500 [IU]
  Filled 2013-04-29: qty 5

## 2013-04-29 NOTE — Progress Notes (Signed)
Per Dr. Julien Nordmann, okay to tx today with creatinine 3.4. Infusion tolerated well

## 2013-04-29 NOTE — Patient Instructions (Signed)
[facility] Discharge Instructions for Patients Receiving Chemotherapy  Today you received the following chemotherapy agents: Kyprolis  To help prevent nausea and vomiting after your treatment, we encourage you to take your nausea medication as prescribed by your physician.    If you develop nausea and vomiting that is not controlled by your nausea medication, call the clinic.   BELOW ARE SYMPTOMS THAT SHOULD BE REPORTED IMMEDIATELY:  *FEVER GREATER THAN 100.5 F  *CHILLS WITH OR WITHOUT FEVER  NAUSEA AND VOMITING THAT IS NOT CONTROLLED WITH YOUR NAUSEA MEDICATION  *UNUSUAL SHORTNESS OF BREATH  *UNUSUAL BRUISING OR BLEEDING  TENDERNESS IN MOUTH AND THROAT WITH OR WITHOUT PRESENCE OF ULCERS  *URINARY PROBLEMS  *BOWEL PROBLEMS  UNUSUAL RASH Items with * indicate a potential emergency and should be followed up as soon as possible.  Feel free to call the clinic you have any questions or concerns. The clinic phone number is [phone].

## 2013-05-01 ENCOUNTER — Other Ambulatory Visit: Payer: Self-pay | Admitting: Medical Oncology

## 2013-05-01 ENCOUNTER — Telehealth: Payer: Self-pay | Admitting: Medical Oncology

## 2013-05-01 ENCOUNTER — Ambulatory Visit (HOSPITAL_BASED_OUTPATIENT_CLINIC_OR_DEPARTMENT_OTHER): Payer: Medicare Other

## 2013-05-01 VITALS — BP 131/72 | HR 88 | Temp 98.7°F | Resp 18

## 2013-05-01 DIAGNOSIS — C9002 Multiple myeloma in relapse: Secondary | ICD-10-CM

## 2013-05-01 DIAGNOSIS — C9001 Multiple myeloma in remission: Secondary | ICD-10-CM

## 2013-05-01 DIAGNOSIS — R7989 Other specified abnormal findings of blood chemistry: Secondary | ICD-10-CM

## 2013-05-01 DIAGNOSIS — R110 Nausea: Secondary | ICD-10-CM

## 2013-05-01 MED ORDER — SODIUM CHLORIDE 0.9 % IV SOLN
500.0000 mL | Freq: Once | INTRAVENOUS | Status: AC
  Administered 2013-05-01: 500 mL via INTRAVENOUS

## 2013-05-01 NOTE — Telephone Encounter (Signed)
Pt called ad stated Dr Ambrose Pancoast wanted her to have labs and IVF today. I called [facility] and left message for Alinda Sierras.  I spoke to Alinda Sierras and she said Dr Janan Ridge wants Glorie to get 500 ml normal saline over 5 hours today for elevated creatinine. Orders received from [place]. Pt scheduled and notified.

## 2013-05-05 ENCOUNTER — Other Ambulatory Visit (HOSPITAL_BASED_OUTPATIENT_CLINIC_OR_DEPARTMENT_OTHER): Payer: Medicare Other

## 2013-05-05 DIAGNOSIS — C9002 Multiple myeloma in relapse: Secondary | ICD-10-CM

## 2013-05-05 LAB — CBC WITH DIFFERENTIAL/PLATELET
BASO%: 0.7 % (ref 0.0–2.0)
BASOS ABS: 0 10*3/uL (ref 0.0–0.1)
EOS ABS: 0.4 10*3/uL (ref 0.0–0.5)
EOS%: 8 % — ABNORMAL HIGH (ref 0.0–7.0)
HEMATOCRIT: 28.9 % — AB (ref 34.8–46.6)
HEMOGLOBIN: 9.8 g/dL — AB (ref 11.6–15.9)
LYMPH#: 1.1 10*3/uL (ref 0.9–3.3)
LYMPH%: 23.6 % (ref 14.0–49.7)
MCH: 33.3 pg (ref 25.1–34.0)
MCHC: 33.9 g/dL (ref 31.5–36.0)
MCV: 98.3 fL (ref 79.5–101.0)
MONO#: 0.8 10*3/uL (ref 0.1–0.9)
MONO%: 17.1 % — ABNORMAL HIGH (ref 0.0–14.0)
NEUT#: 2.3 10*3/uL (ref 1.5–6.5)
NEUT%: 50.6 % (ref 38.4–76.8)
Platelets: 126 10*3/uL — ABNORMAL LOW (ref 145–400)
RBC: 2.94 10*6/uL — ABNORMAL LOW (ref 3.70–5.45)
RDW: 16.1 % — ABNORMAL HIGH (ref 11.2–14.5)
WBC: 4.6 10*3/uL (ref 3.9–10.3)

## 2013-05-05 LAB — COMPREHENSIVE METABOLIC PANEL (CC13)
ALT: 46 U/L (ref 0–55)
ANION GAP: 11 meq/L (ref 3–11)
AST: 35 U/L — ABNORMAL HIGH (ref 5–34)
Albumin: 3.7 g/dL (ref 3.5–5.0)
Alkaline Phosphatase: 151 U/L — ABNORMAL HIGH (ref 40–150)
BUN: 28.1 mg/dL — AB (ref 7.0–26.0)
CALCIUM: 9.3 mg/dL (ref 8.4–10.4)
CHLORIDE: 111 meq/L — AB (ref 98–109)
CO2: 20 meq/L — AB (ref 22–29)
Creatinine: 3.3 mg/dL (ref 0.6–1.1)
Glucose: 108 mg/dl (ref 70–140)
Potassium: 3.6 mEq/L (ref 3.5–5.1)
Sodium: 142 mEq/L (ref 136–145)
Total Bilirubin: 1.28 mg/dL — ABNORMAL HIGH (ref 0.20–1.20)
Total Protein: 6.2 g/dL — ABNORMAL LOW (ref 6.4–8.3)

## 2013-05-05 LAB — LACTATE DEHYDROGENASE (CC13): LDH: 274 U/L — ABNORMAL HIGH (ref 125–245)

## 2013-05-07 LAB — KAPPA/LAMBDA LIGHT CHAINS
KAPPA FREE LGHT CHN: 1.22 mg/dL (ref 0.33–1.94)
Kappa:Lambda Ratio: 6.78 — ABNORMAL HIGH (ref 0.26–1.65)
Lambda Free Lght Chn: 0.18 mg/dL — ABNORMAL LOW (ref 0.57–2.63)

## 2013-05-07 LAB — IGG, IGA, IGM
IGG (IMMUNOGLOBIN G), SERUM: 432 mg/dL — AB (ref 690–1700)
IgA: 9 mg/dL — ABNORMAL LOW (ref 69–380)
IgM, Serum: 9 mg/dL — ABNORMAL LOW (ref 52–322)

## 2013-05-07 LAB — BETA 2 MICROGLOBULIN, SERUM: Beta-2 Microglobulin: 16.5 mg/L — ABNORMAL HIGH

## 2013-05-08 ENCOUNTER — Encounter: Payer: Self-pay | Admitting: Internal Medicine

## 2013-05-12 ENCOUNTER — Encounter: Payer: Self-pay | Admitting: Internal Medicine

## 2013-05-12 ENCOUNTER — Other Ambulatory Visit: Payer: Medicare Other

## 2013-05-12 ENCOUNTER — Telehealth: Payer: Self-pay | Admitting: Internal Medicine

## 2013-05-12 ENCOUNTER — Ambulatory Visit (HOSPITAL_BASED_OUTPATIENT_CLINIC_OR_DEPARTMENT_OTHER): Payer: Medicare Other | Admitting: Internal Medicine

## 2013-05-12 VITALS — BP 104/57 | HR 82 | Temp 98.9°F | Resp 20 | Ht 65.0 in | Wt 182.7 lb

## 2013-05-12 DIAGNOSIS — C9002 Multiple myeloma in relapse: Secondary | ICD-10-CM

## 2013-05-12 DIAGNOSIS — R5381 Other malaise: Secondary | ICD-10-CM

## 2013-05-12 DIAGNOSIS — R42 Dizziness and giddiness: Secondary | ICD-10-CM

## 2013-05-12 DIAGNOSIS — R5383 Other fatigue: Secondary | ICD-10-CM

## 2013-05-12 NOTE — Telephone Encounter (Signed)
Gave pt appt for lab and MD for May and April 2015

## 2013-05-12 NOTE — Progress Notes (Signed)
[facility] [facility]  [phone] [phone]  OFFICE PROGRESS NOTE   PRINCIPAL DIAGNOSES:  1. Recurrent multiple myeloma initially diagnosed in 2002, initially with smoldering myeloma at [facility].  2. Ductal carcinoma in situ status post mastectomy with sentinel lymph node biopsy in October 2008.  PRIOR THERAPY:  1. Status post treatment with tamoxifen from November 2008 through February 2009, discontinued secondary to intolerance. 2. Status post 3 cycles of chemotherapy with Revlimid and Decadron followed by 1 cycle of Decadron only with mild response. 3. Status post 2 cycles of systemic chemotherapy with Velcade, Doxil and Decadron discontinued secondary to significant peripheral neuropathy. Last dose was given May 2010 at [facility]. 4. Status post autologous peripheral blood stem cell transplant on October 01, 2008 at [facility] under the care of Dr. Phyllis Ginger.  CURRENT THERAPY: Systemic chemotherapy with Carfilzomib initially was 20 mg/M2 and will be increased after cycle #1 to 36 mg/M2 on days 1, 2, 8, 9, 15 and 16 every 4 weeks in addition to Cytoxan 300 mg/M2 and Decadron 40 mg by mouth weekly basis, status post 4 cycles. First cycle on 12/29/2012.   INTERVAL HISTORY: Nancy Norris 73 y.o. female returns to the clinic today for followup visit. The patient continues to complain of increasing fatigue and weakness and occasional dizzy spells. She is retired from the recent treatment and would like to consider break off chemotherapy. She tolerated the last cycle of her treatment fairly well except for the fatigue. She denied having any significant nausea or vomiting, no fever or chills. The patient denied having any chest pain, shortness breath, cough or hemoptysis. No significant weight loss or night sweats. She had repeat myeloma panel performed recently and she is here for evaluation and discussion of her lab results.    MEDICAL HISTORY: Past Medical History  Diagnosis Date  . Hyperkalemia   . Hypothyroidism   . COPD (chronic obstructive pulmonary disease)   . Hyponatremia   . Dizziness   . Fibromyalgia   . Breast cancer   . Multiple myeloma   . Mucositis   . CKD (chronic kidney disease) stage 3, GFR 30-59 ml/min   . Hx of echocardiogram     a.  Echocardiogram (12/26/2012): EF 45-40%, grade 1 diastolic dysfunction;   b.  Echocardiogram (02/2013): EF 55-60%, no WMA, trivial effusion  . Fibromyalgia   . Arthritis   . Hx of cardiovascular stress test     LexiScan with low level exercise Myoview (02/2013): No ischemia, EF 72%; normal study    ALLERGIES:  is allergic to codeine; latex; iodinated diagnostic agents; other; sulfa antibiotics; and adhesive.  MEDICATIONS:  Current Outpatient Prescriptions  Medication Sig Dispense Refill  . acetaminophen (TYLENOL) 325 MG tablet Take 650 mg by mouth every 6 (six) hours as needed for mild pain or moderate pain.       Marland Kitchen albuterol (PROVENTIL HFA;VENTOLIN HFA) 108 (90 BASE) MCG/ACT inhaler Inhale 2 puffs into the lungs 2 (two) times daily as needed for wheezing or shortness of breath.      Marland Kitchen ascorbic acid (VITAMIN C) 250 MG CHEW Chew 250 mg by mouth daily.      Marland Kitchen aspirin EC 81 MG tablet Take 1 tablet (81 mg total) by mouth daily.      Marland Kitchen buPROPion (WELLBUTRIN XL) 150 MG 24 hr tablet Take 1 tablet (150 mg total) by mouth every morning. For mood and depression  30 tablet  99  . carvedilol (  COREG) 3.125 MG tablet       . cetirizine (ZYRTEC) 10 MG tablet Take 10 mg by mouth at bedtime.       . cholecalciferol (VITAMIN D) 1000 UNITS tablet Take 4,000 Units by mouth daily.      . citalopram (CELEXA) 40 MG tablet TAKE 1 TABLET BY MOUTH ONCE DAILY.  90 tablet  0  . cromolyn (OPTICROM) 4 % ophthalmic solution Place 1 drop into both eyes 4 (four) times daily.      . cyanocobalamin 2000 MCG tablet Take 2,000 mcg by mouth daily.       Marland Kitchen esomeprazole (NEXIUM) 40 MG capsule  Take 1 capsule (40 mg total) by mouth daily. Each morning for acid indigestion  30 capsule  99  . Ferrous Sulfate 134 MG TABS Take 1 tablet by mouth daily.       . fluticasone (FLONASE) 50 MCG/ACT nasal spray       . gabapentin (NEURONTIN) 300 MG capsule Take 300 mg by mouth at bedtime.      . isosorbide mononitrate (IMDUR) 30 MG 24 hr tablet       . levothyroxine (SYNTHROID, LEVOTHROID) 150 MCG tablet TAKE 1 TABLET BY MOUTH ONCE DAILY.  1 1/2 tablet on saturday and sunday      . loteprednol (LOTEMAX) 0.2 % SUSP Place 1 drop into both eyes 3 (three) times daily.       . Multiple Vitamins-Minerals (HAIR/SKIN/NAILS PO) Take 1 tablet by mouth daily.      . nitroGLYCERIN (NITROSTAT) 0.4 MG SL tablet Place 1 tablet (0.4 mg total) under the tongue every 5 (five) minutes as needed for chest pain.  25 tablet  3  . ondansetron (ZOFRAN-ODT) 8 MG disintegrating tablet Take 1 tablet (8 mg total) by mouth every 8 (eight) hours as needed for nausea or vomiting.  20 tablet  3  . prochlorperazine (COMPAZINE) 10 MG tablet Take 1 tablet (10 mg total) by mouth every 6 (six) hours as needed for nausea or vomiting.  60 tablet  0  . ranitidine (ZANTAC) 300 MG tablet Take 1 tablet (300 mg total) by mouth at bedtime. For acid reflux and indigestion  30 tablet  99  . sucralfate (CARAFATE) 1 G tablet Take 1 tablet (1 g total) by mouth 4 (four) times daily. Dissolve in water and swallow  120 tablet  99  . traMADol (ULTRAM) 50 MG tablet Take 1 tablet (50 mg total) by mouth every 6 (six) hours as needed.  30 tablet  0  . Umeclidinium-Vilanterol 62.5-25 MCG/INH AEPB Use daily      . zolpidem (AMBIEN) 5 MG tablet Take 1 tablet (5 mg total) by mouth at bedtime as needed for sleep.  30 tablet  0  . Alum & Mag Hydroxide-Simeth (MAGIC MOUTHWASH W/LIDOCAINE) SOLN Take 5 mLs by mouth 4 (four) times daily as needed for mouth pain (swish and spit QID PRN).  240 mL  0  . dexamethasone (DECADRON) 4 MG tablet       . lidocaine-prilocaine  (EMLA) cream Apply 1 application topically as needed. Apply to port 1 hour before chemotherapy appt.  30 g  0   No current facility-administered medications for this visit.   Facility-Administered Medications Ordered in Other Visits  Medication Dose Route Frequency Provider Last Rate Last Dose  . 0.9 %  sodium chloride infusion   Intravenous Once Adrena E Johnson, PA-C      . sodium chloride 0.9 % injection 10 mL  10 mL Intracatheter PRN Curt Bears, MD   10 mL at 01/05/13 1825  . sodium chloride 0.9 % injection 10 mL  10 mL Intracatheter PRN Carlton Adam, PA-C        SURGICAL HISTORY:  Past Surgical History  Procedure Laterality Date  . History of port removal    . Status post stem cell transplant on September 28, 2008.    Marland Kitchen Abdominal hysterectomy  1981  . Cholecystectomy  1971  . Mastectomy Left 2008  . Cataract extraction, bilateral      REVIEW OF SYSTEMS:  Constitutional: positive for fatigue Eyes: negative Ears, nose, mouth, throat, and face: negative Respiratory: negative Cardiovascular: negative Gastrointestinal: positive for nausea Genitourinary:negative Integument/breast: negative Hematologic/lymphatic: negative Musculoskeletal:negative Neurological: negative Behavioral/Psych: negative Endocrine: negative Allergic/Immunologic: negative   PHYSICAL EXAMINATION: General appearance: alert, cooperative and no distress Head: Normocephalic, without obvious abnormality, atraumatic Neck: no adenopathy Lymph nodes: Cervical, supraclavicular, and axillary nodes normal. Resp: clear to auscultation bilaterally Back: symmetric, no curvature. ROM normal. No CVA tenderness. Cardio: regular rate and rhythm, S1, S2 normal, no murmur, click, rub or gallop GI: soft, non-tender; bowel sounds normal; no masses,  no organomegaly Extremities: extremities normal, atraumatic, no cyanosis or edema Neurologic: Alert and oriented X 3, normal strength and tone. Normal symmetric  reflexes. Normal coordination and gait  ECOG PERFORMANCE STATUS: 1 - Symptomatic but completely ambulatory  Blood pressure 104/57, pulse 82, temperature 98.9 F (37.2 C), temperature source Oral, resp. rate 20, height $RemoveBe'5\' 5"'IitszfIIJ$  (1.651 m), weight 182 lb 11.2 oz (82.872 kg), SpO2 100.00%.  LABORATORY DATA: Lab Results  Component Value Date   WBC 4.6 05/05/2013   HGB 9.8* 05/05/2013   HCT 28.9* 05/05/2013   MCV 98.3 05/05/2013   PLT 126* 05/05/2013      Chemistry      Component Value Date/Time   NA 142 05/05/2013 1308   NA 140 02/24/2013 1708   K 3.6 05/05/2013 1308   K 4.5 02/24/2013 1708   CL 110 02/24/2013 1708   CL 105 03/19/2012 0811   CO2 20* 05/05/2013 1308   CO2 20 02/24/2013 1708   BUN 28.1* 05/05/2013 1308   BUN 26* 02/24/2013 1708   CREATININE 3.3* 05/05/2013 1308   CREATININE 2.45* 02/24/2013 1708   CREATININE 2.56* 01/14/2013 0625      Component Value Date/Time   CALCIUM 9.3 05/05/2013 1308   CALCIUM 8.8 02/24/2013 1708   ALKPHOS 151* 05/05/2013 1308   ALKPHOS 170* 02/24/2013 1708   AST 35* 05/05/2013 1308   AST 38* 02/24/2013 1708   ALT 46 05/05/2013 1308   ALT 34 02/24/2013 1708   BILITOT 1.28* 05/05/2013 1308   BILITOT 0.9 02/24/2013 1708     Other lab results: Beta-2 microglobulin 16.50, free kappa light chain is down to 1.22 from 16.50 before starting the treatment, free lambda light chain 0.18, kappa/lambda ratio 6.78. IgG 432, IgA 9 and IgM 9.    RADIOGRAPHIC STUDIES:  ASSESSMENT AND PLAN: This is a very pleasant 73 years old white female with relapsed multiple myeloma. She is currently undergoing systemic chemotherapy with Carfilzomib, Cytoxan and dexamethasone status post 4 cycles. She is tolerating her treatment fairly well with no significant adverse effects except for increasing fatigue and weakness as well as dizzy spells recently. Her myeloma panel showed continuous improvement in her disease with significant decline in the free kappa light chain to the normal range. I  have a lengthy discussion with the patient today about her current disease  status and treatment options. I gave the patient the option of continuing treatment with the same regimen versus take a break off the treatment since she has significant fatigue and weakness recently. She is not interested in taking a break off treatment. I would see her back for followup visit in 2 months with repeat myeloma panel. Her nephrologist would like her to have repeat CBC and comprehensive metabolic panel in 2 weeks for evaluation of her renal dysfunction and anemia. I ordered these lab test.  She was advised to call immediately if she has any concerning symptoms in the interval.  Disclaimer: This note was dictated with voice recognition software. Similar sounding words can inadvertently be transcribed and may not be corrected upon review. Curt Bears, MD 05/12/2013

## 2013-05-15 ENCOUNTER — Ambulatory Visit (INDEPENDENT_AMBULATORY_CARE_PROVIDER_SITE_OTHER): Payer: Medicare Other | Admitting: Physician Assistant

## 2013-05-15 VITALS — BP 120/62 | HR 76 | Temp 97.9°F | Resp 16 | Ht 65.0 in | Wt 182.0 lb

## 2013-05-15 DIAGNOSIS — L299 Pruritus, unspecified: Secondary | ICD-10-CM

## 2013-05-15 DIAGNOSIS — R21 Rash and other nonspecific skin eruption: Secondary | ICD-10-CM

## 2013-05-15 LAB — HEPATIC FUNCTION PANEL
ALT: 22 U/L (ref 0–35)
AST: 19 U/L (ref 0–37)
Albumin: 4.1 g/dL (ref 3.5–5.2)
Alkaline Phosphatase: 130 U/L — ABNORMAL HIGH (ref 39–117)
BILIRUBIN DIRECT: 0.2 mg/dL (ref 0.0–0.3)
BILIRUBIN INDIRECT: 0.6 mg/dL (ref 0.2–1.2)
BILIRUBIN TOTAL: 0.8 mg/dL (ref 0.2–1.2)
Total Protein: 5.8 g/dL — ABNORMAL LOW (ref 6.0–8.3)

## 2013-05-15 LAB — CBC WITH DIFFERENTIAL/PLATELET
Basophils Absolute: 0.1 10*3/uL (ref 0.0–0.1)
Basophils Relative: 1 % (ref 0–1)
EOS ABS: 0.5 10*3/uL (ref 0.0–0.7)
EOS PCT: 9 % — AB (ref 0–5)
HEMATOCRIT: 25.5 % — AB (ref 36.0–46.0)
HEMOGLOBIN: 8.7 g/dL — AB (ref 12.0–15.0)
LYMPHS ABS: 1.3 10*3/uL (ref 0.7–4.0)
Lymphocytes Relative: 23 % (ref 12–46)
MCH: 33.6 pg (ref 26.0–34.0)
MCHC: 34.1 g/dL (ref 30.0–36.0)
MCV: 98.5 fL (ref 78.0–100.0)
MONO ABS: 0.8 10*3/uL (ref 0.1–1.0)
MONOS PCT: 14 % — AB (ref 3–12)
Neutro Abs: 3 10*3/uL (ref 1.7–7.7)
Neutrophils Relative %: 53 % (ref 43–77)
PLATELETS: 211 10*3/uL (ref 150–400)
RBC: 2.59 MIL/uL — AB (ref 3.87–5.11)
RDW: 17.8 % — ABNORMAL HIGH (ref 11.5–15.5)
WBC: 5.7 10*3/uL (ref 4.0–10.5)

## 2013-05-15 LAB — BASIC METABOLIC PANEL WITH GFR
BUN: 36 mg/dL — ABNORMAL HIGH (ref 6–23)
CO2: 23 mEq/L (ref 19–32)
Calcium: 9.4 mg/dL (ref 8.4–10.5)
Chloride: 109 mEq/L (ref 96–112)
Creat: 3.45 mg/dL — ABNORMAL HIGH (ref 0.50–1.10)
GFR, EST AFRICAN AMERICAN: 15 mL/min — AB
GFR, Est Non African American: 13 mL/min — ABNORMAL LOW
GLUCOSE: 132 mg/dL — AB (ref 70–99)
POTASSIUM: 3.3 meq/L — AB (ref 3.5–5.3)
SODIUM: 141 meq/L (ref 135–145)

## 2013-05-15 LAB — PHOSPHORUS: Phosphorus: 3.1 mg/dL (ref 2.3–4.6)

## 2013-05-15 MED ORDER — DEXAMETHASONE SODIUM PHOSPHATE 10 MG/ML IJ SOLN
10.0000 mg | Freq: Once | INTRAMUSCULAR | Status: AC
  Administered 2013-05-15: 10 mg via INTRAMUSCULAR

## 2013-05-15 MED ORDER — PREDNISONE 20 MG PO TABS: ORAL_TABLET | ORAL | Status: DC

## 2013-05-15 NOTE — Patient Instructions (Signed)

## 2013-05-15 NOTE — Progress Notes (Signed)
   Subjective:    Patient ID: Nancy Norris, female    DOB: 1940/10/25, 73 y.o.   MRN: [id number]  Rash This is a new problem. Episode onset: 3 days. The problem has been gradually worsening since onset. The affected locations include the abdomen, chest, torso, back, right upper leg and left upper leg. The rash is characterized by itchiness, scaling and redness. She was exposed to nothing. Associated symptoms include fatigue and shortness of breath. Pertinent negatives include no anorexia, congestion, cough, diarrhea, eye pain, facial edema, fever, joint pain, nail changes, rhinorrhea, sore throat or vomiting. Past treatments include antihistamine. The treatment provided no relief.   Was on steroids but recently off   Review of Systems  Constitutional: Positive for fatigue. Negative for fever.  HENT: Negative for congestion, rhinorrhea and sore throat.   Eyes: Negative for pain.  Respiratory: Positive for shortness of breath. Negative for cough.   Gastrointestinal: Negative for vomiting, diarrhea and anorexia.  Musculoskeletal: Negative for joint pain.  Skin: Positive for rash. Negative for nail changes.       Objective:   Physical Exam  Constitutional: She is oriented to person, place, and time. She appears well-developed and well-nourished.  HENT:  Head: Normocephalic and atraumatic.  Eyes: Conjunctivae are normal. Pupils are equal, round, and reactive to light.  Neck: Normal range of motion. Neck supple.  Cardiovascular: Normal rate and regular rhythm.   Pulmonary/Chest: Effort normal and breath sounds normal.  Abdominal: Soft. Bowel sounds are normal. She exhibits distension. There is no tenderness. There is no rebound.  Musculoskeletal: Normal range of motion.  Neurological: She is alert and oriented to person, place, and time.  Skin: Skin is warm and dry.  Excoriations along lower back and abdomen, questionable 1-2 mm erythematous macules diffuse on arm, leg, back and abdomen.         Assessment & Plan:  Rash: Stage 5 CKD, recently increase in bilirubin- recheck BMP LFT Phosphorus Prednisone 20 #20, injection in the office

## 2013-05-16 ENCOUNTER — Encounter: Payer: Self-pay | Admitting: Physician Assistant

## 2013-05-18 ENCOUNTER — Other Ambulatory Visit: Payer: Self-pay | Admitting: Physician Assistant

## 2013-05-18 ENCOUNTER — Encounter: Payer: Self-pay | Admitting: Physician Assistant

## 2013-05-18 MED ORDER — PREDNISONE 10 MG PO TABS: ORAL_TABLET | ORAL | Status: DC

## 2013-05-19 ENCOUNTER — Other Ambulatory Visit: Payer: Self-pay | Admitting: *Deleted

## 2013-05-19 DIAGNOSIS — C9001 Multiple myeloma in remission: Secondary | ICD-10-CM

## 2013-05-19 NOTE — Progress Notes (Signed)
Pt called stating she is feeling very fatigued.  She saw her PCP 05/15/13 and they did lab work, showing hbg 8.7  She wants to know if she can have blood.  She has been very fatigued and states she is only awake for 6 hours a day.  Per dr Vista Mink, she comes in 5/12 for lab only.  Check cbc and type and hold.  She is to call the office that day to see what the results are and if she will need blood.  She verbalized understanding.  SLJ

## 2013-05-26 ENCOUNTER — Ambulatory Visit (HOSPITAL_COMMUNITY)
Admission: RE | Admit: 2013-05-26 | Discharge: 2013-05-26 | Disposition: A | Discharge status: Home / Self Care | Payer: Medicare Other | Source: Ambulatory Visit | Attending: Internal Medicine | Admitting: Internal Medicine

## 2013-05-26 ENCOUNTER — Other Ambulatory Visit: Payer: Self-pay | Admitting: *Deleted

## 2013-05-26 ENCOUNTER — Telehealth: Payer: Self-pay | Admitting: Internal Medicine

## 2013-05-26 ENCOUNTER — Other Ambulatory Visit (HOSPITAL_BASED_OUTPATIENT_CLINIC_OR_DEPARTMENT_OTHER): Payer: Medicare Other

## 2013-05-26 DIAGNOSIS — C9002 Multiple myeloma in relapse: Secondary | ICD-10-CM

## 2013-05-26 DIAGNOSIS — D649 Anemia, unspecified: Secondary | ICD-10-CM | POA: Insufficient documentation

## 2013-05-26 DIAGNOSIS — C9001 Multiple myeloma in remission: Secondary | ICD-10-CM

## 2013-05-26 LAB — CBC WITH DIFFERENTIAL/PLATELET
BASO%: 0.5 % (ref 0.0–2.0)
Basophils Absolute: 0 10*3/uL (ref 0.0–0.1)
EOS ABS: 0.2 10*3/uL (ref 0.0–0.5)
EOS%: 2.3 % (ref 0.0–7.0)
HCT: 24.9 % — ABNORMAL LOW (ref 34.8–46.6)
HEMOGLOBIN: 8.3 g/dL — AB (ref 11.6–15.9)
LYMPH%: 14.7 % (ref 14.0–49.7)
MCH: 35.7 pg — AB (ref 25.1–34.0)
MCHC: 33.2 g/dL (ref 31.5–36.0)
MCV: 107.7 fL — AB (ref 79.5–101.0)
MONO#: 0.7 10*3/uL (ref 0.1–0.9)
MONO%: 7.6 % (ref 0.0–14.0)
NEUT%: 74.9 % (ref 38.4–76.8)
NEUTROS ABS: 6.9 10*3/uL — AB (ref 1.5–6.5)
PLATELETS: 175 10*3/uL (ref 145–400)
RBC: 2.31 10*6/uL — ABNORMAL LOW (ref 3.70–5.45)
RDW: 18.1 % — ABNORMAL HIGH (ref 11.2–14.5)
WBC: 9.2 10*3/uL (ref 3.9–10.3)
lymph#: 1.4 10*3/uL (ref 0.9–3.3)

## 2013-05-26 LAB — PREPARE RBC (CROSSMATCH)

## 2013-05-26 LAB — HOLD TUBE, BLOOD BANK

## 2013-05-26 NOTE — Telephone Encounter (Signed)
called Nancy Norris left VM for PRBC for tomorrow

## 2013-05-26 NOTE — Progress Notes (Signed)
Hbg today 8.3, per Dr Vista Mink, pt can have 1 unit PRBC's.  Spoke to pt in the lobby, she is still very fatigued.  Pt sent to the schedulers to be scheduled for blood tomorrow (infusion room has no availability today).  SLJ

## 2013-05-27 ENCOUNTER — Ambulatory Visit: Payer: Medicare Other

## 2013-05-27 ENCOUNTER — Ambulatory Visit (HOSPITAL_BASED_OUTPATIENT_CLINIC_OR_DEPARTMENT_OTHER): Payer: Medicare Other

## 2013-05-27 ENCOUNTER — Other Ambulatory Visit: Payer: Self-pay | Admitting: *Deleted

## 2013-05-27 ENCOUNTER — Other Ambulatory Visit: Payer: Self-pay | Admitting: Medical Oncology

## 2013-05-27 VITALS — BP 124/65 | HR 64 | Temp 97.7°F | Resp 17

## 2013-05-27 DIAGNOSIS — D649 Anemia, unspecified: Secondary | ICD-10-CM

## 2013-05-27 DIAGNOSIS — Z01810 Encounter for preprocedural cardiovascular examination: Secondary | ICD-10-CM

## 2013-05-27 DIAGNOSIS — N184 Chronic kidney disease, stage 4 (severe): Secondary | ICD-10-CM

## 2013-05-27 MED ORDER — ACETAMINOPHEN 325 MG PO TABS
ORAL_TABLET | ORAL | Status: AC
  Filled 2013-05-27: qty 2

## 2013-05-27 MED ORDER — SODIUM CHLORIDE 0.9 % IJ SOLN
10.0000 mL | INTRAMUSCULAR | Status: AC | PRN
  Administered 2013-05-27: 10 mL
  Filled 2013-05-27: qty 10

## 2013-05-27 MED ORDER — ACETAMINOPHEN 325 MG PO TABS
650.0000 mg | ORAL_TABLET | Freq: Once | ORAL | Status: AC
  Administered 2013-05-27: 650 mg via ORAL

## 2013-05-27 MED ORDER — HEPARIN SOD (PORK) LOCK FLUSH 100 UNIT/ML IV SOLN
500.0000 [IU] | Freq: Every day | INTRAVENOUS | Status: AC | PRN
  Administered 2013-05-27: 500 [IU]
  Filled 2013-05-27: qty 5

## 2013-05-27 MED ORDER — DIPHENHYDRAMINE HCL 25 MG PO CAPS
25.0000 mg | ORAL_CAPSULE | Freq: Once | ORAL | Status: AC
  Administered 2013-05-27: 25 mg via ORAL

## 2013-05-27 MED ORDER — SODIUM CHLORIDE 0.9 % IV SOLN
250.0000 mL | Freq: Once | INTRAVENOUS | Status: AC
  Administered 2013-05-27: 250 mL via INTRAVENOUS

## 2013-05-27 MED ORDER — DIPHENHYDRAMINE HCL 25 MG PO CAPS
ORAL_CAPSULE | ORAL | Status: AC
  Filled 2013-05-27: qty 1

## 2013-05-27 MED ORDER — SODIUM CHLORIDE 0.9 % IJ SOLN
10.0000 mL | INTRAMUSCULAR | Status: DC | PRN
  Filled 2013-05-27: qty 10

## 2013-05-27 NOTE — Progress Notes (Unsigned)
Dr Burman Foster faxed order for cmet to be drawn today. LAb notifed.

## 2013-05-27 NOTE — Patient Instructions (Signed)
Blood Transfusion  A blood transfusion replaces your blood or some of its parts. Blood is replaced when you have lost blood because of surgery, an accident, or for severe blood conditions like anemia. You can donate blood to be used on yourself if you have a planned surgery. If you lose blood during that surgery, your own blood can be given back to you. Any blood given to you is checked to make sure it matches your blood type. Your temperature, blood pressure, and heart rate (vital signs) will be checked often.  GET HELP RIGHT AWAY IF:   You feel sick to your stomach (nauseous) or throw up (vomit).  You have watery poop (diarrhea).  You have shortness of breath or trouble breathing.  You have blood in your pee (urine) or have dark colored pee.  You have chest pain or tightness.  Your eyes or skin turn yellow (jaundice).  You have a temperature by mouth above 102 F (38.9 C), not controlled by medicine.  You start to shake and have chills.  You develop a a red rash (hives) or feel itchy.  You develop lightheadedness or feel confused.  You develop back, joint, or muscle pain.  You do not feel hungry (lost appetite).  You feel tired, restless, or nervous.  You develop belly (abdominal) cramps. Document Released: 03/30/2008 Document Revised: 03/26/2011 Document Reviewed: 03/30/2008 [employer] Patient Information 2014 [employer].  

## 2013-05-28 LAB — TYPE AND SCREEN
ABO/RH(D): O POS
ANTIBODY SCREEN: NEGATIVE
Unit division: 0

## 2013-05-31 ENCOUNTER — Telehealth: Payer: Self-pay | Admitting: Internal Medicine

## 2013-05-31 NOTE — Telephone Encounter (Signed)
Called pt left message for appt on june and mailed appt

## 2013-06-01 ENCOUNTER — Other Ambulatory Visit: Payer: Self-pay | Admitting: Internal Medicine

## 2013-06-04 ENCOUNTER — Other Ambulatory Visit: Payer: Self-pay | Admitting: *Deleted

## 2013-06-04 MED ORDER — ALBUTEROL SULFATE HFA 108 (90 BASE) MCG/ACT IN AERS: 2.0000 | INHALATION_SPRAY | Freq: Two times a day (BID) | RESPIRATORY_TRACT | Status: DC | PRN

## 2013-06-09 ENCOUNTER — Encounter: Payer: Self-pay | Admitting: Vascular Surgery

## 2013-06-10 ENCOUNTER — Ambulatory Visit (INDEPENDENT_AMBULATORY_CARE_PROVIDER_SITE_OTHER)
Admission: RE | Admit: 2013-06-10 | Discharge: 2013-06-10 | Disposition: A | Discharge status: Home / Self Care | Payer: Medicare Other | Source: Ambulatory Visit | Attending: Vascular Surgery | Admitting: Vascular Surgery

## 2013-06-10 ENCOUNTER — Encounter: Payer: Self-pay | Admitting: Vascular Surgery

## 2013-06-10 ENCOUNTER — Ambulatory Visit (HOSPITAL_COMMUNITY)
Admission: RE | Admit: 2013-06-10 | Discharge: 2013-06-10 | Disposition: A | Discharge status: Home / Self Care | Payer: Medicare Other | Source: Ambulatory Visit | Attending: Vascular Surgery | Admitting: Vascular Surgery

## 2013-06-10 ENCOUNTER — Other Ambulatory Visit: Payer: Self-pay | Admitting: *Deleted

## 2013-06-10 ENCOUNTER — Encounter: Payer: Self-pay | Admitting: *Deleted

## 2013-06-10 ENCOUNTER — Ambulatory Visit (INDEPENDENT_AMBULATORY_CARE_PROVIDER_SITE_OTHER): Payer: Medicare Other | Admitting: Vascular Surgery

## 2013-06-10 VITALS — BP 108/54 | HR 75 | Resp 16 | Ht 65.5 in | Wt 184.0 lb

## 2013-06-10 DIAGNOSIS — N184 Chronic kidney disease, stage 4 (severe): Secondary | ICD-10-CM

## 2013-06-10 DIAGNOSIS — Z01810 Encounter for preprocedural cardiovascular examination: Secondary | ICD-10-CM

## 2013-06-10 NOTE — Progress Notes (Signed)
[facility] [place]  Patient name: [name] MRN: [id number] DOB: 1940/11/13 Sex: female  REASON FOR CONSULT: Evaluate for hemodialysis access. Referred by Dr. Marval Regal  HPI: Nancy Norris is a 73 y.o. female with chronic kidney disease. This is according to the patient related to chemotherapy she received for multiple myeloma. She has stage IV chronic kidney disease. She is not yet on dialysis. She is right-handed. She denies any recent uremic symptoms. Specifically, she denies nausea, vomiting, fatigue, or anorexia.  I have reviewed her records from the referring physician's office. She has stage IV chronic kidney disease. Her most recent visit she had some increase in her creatinine.   Past Medical History  Diagnosis Date  . Hyperkalemia   . Hypothyroidism   . COPD (chronic obstructive pulmonary disease)   . Hyponatremia   . Dizziness   . Fibromyalgia   . Breast cancer   . Multiple myeloma   . Mucositis   . CKD (chronic kidney disease) stage 3, GFR 30-59 ml/min   . Hx of echocardiogram     a.  Echocardiogram (12/26/2012): EF 83-66%, grade 1 diastolic dysfunction;   b.  Echocardiogram (02/2013): EF 55-60%, no WMA, trivial effusion  . Fibromyalgia   . Arthritis   . Hx of cardiovascular stress test     LexiScan with low level exercise Myoview (02/2013): No ischemia, EF 72%; normal study   Family History  Problem Relation Age of Onset  . Arthritis Mother   . Asthma Mother   . Cancer Sister   . Hyperlipidemia Brother    SOCIAL HISTORY: History  Substance Use Topics  . Smoking status: Former Smoker -- 1.00 packs/day for 30 years    Types: Cigarettes    Quit date: 02/15/2005  . Smokeless tobacco: Never Used  . Alcohol Use: No   Allergies  Allergen Reactions  . Codeine Anaphylaxis  . Latex Shortness Of Breath    Adhesive products   . Iodinated Diagnostic Agents Itching    Happened 40 years ago  . Other     Onion, chocolate causes  headache  . Sulfa Antibiotics Itching  . Adhesive [Tape]     blisters   Current Outpatient Prescriptions  Medication Sig Dispense Refill  . acetaminophen (TYLENOL) 325 MG tablet Take 650 mg by mouth every 6 (six) hours as needed for mild pain or moderate pain.       Marland Kitchen albuterol (PROVENTIL HFA;VENTOLIN HFA) 108 (90 BASE) MCG/ACT inhaler Inhale 2 puffs into the lungs 2 (two) times daily as needed for wheezing or shortness of breath.  1 each  6  . Alum & Mag Hydroxide-Simeth (MAGIC MOUTHWASH W/LIDOCAINE) SOLN Take 5 mLs by mouth 4 (four) times daily as needed for mouth pain (swish and spit QID PRN).  240 mL  0  . ascorbic acid (VITAMIN C) 250 MG CHEW Chew 250 mg by mouth daily.      Marland Kitchen aspirin EC 81 MG tablet Take 1 tablet (81 mg total) by mouth daily.      Marland Kitchen buPROPion (WELLBUTRIN XL) 150 MG 24 hr tablet Take 1 tablet (150 mg total) by mouth every morning. For mood and depression  30 tablet  99  . carvedilol (COREG) 3.125 MG tablet       . cetirizine (ZYRTEC) 10 MG tablet Take 10 mg by mouth at bedtime.       . cholecalciferol (VITAMIN D) 1000 UNITS tablet Take 4,000 Units by mouth daily.      Marland Kitchen  citalopram (CELEXA) 40 MG tablet TAKE 1 TABLET BY MOUTH ONCE DAILY.  90 tablet  0  . cromolyn (OPTICROM) 4 % ophthalmic solution Place 1 drop into both eyes 4 (four) times daily.      . cyanocobalamin 2000 MCG tablet Take 2,000 mcg by mouth daily.       Marland Kitchen dexamethasone (DECADRON) 4 MG tablet       . esomeprazole (NEXIUM) 40 MG capsule Take 1 capsule (40 mg total) by mouth daily. Each morning for acid indigestion  30 capsule  99  . Ferrous Sulfate 134 MG TABS Take 1 tablet by mouth daily.       . fluticasone (FLONASE) 50 MCG/ACT nasal spray       . gabapentin (NEURONTIN) 300 MG capsule Take 300 mg by mouth at bedtime.      . isosorbide mononitrate (IMDUR) 30 MG 24 hr tablet       . levothyroxine (SYNTHROID, LEVOTHROID) 150 MCG tablet TAKE 1 TABLET BY MOUTH ONCE DAILY.  1 1/2 tablet on saturday and sunday       . lidocaine-prilocaine (EMLA) cream Apply 1 application topically as needed. Apply to port 1 hour before chemotherapy appt.  30 g  0  . loteprednol (LOTEMAX) 0.2 % SUSP Place 1 drop into both eyes 3 (three) times daily.       . Multiple Vitamins-Minerals (HAIR/SKIN/NAILS PO) Take 1 tablet by mouth daily.      . nitroGLYCERIN (NITROSTAT) 0.4 MG SL tablet Place 1 tablet (0.4 mg total) under the tongue every 5 (five) minutes as needed for chest pain.  25 tablet  3  . ondansetron (ZOFRAN-ODT) 8 MG disintegrating tablet Take 1 tablet (8 mg total) by mouth every 8 (eight) hours as needed for nausea or vomiting.  20 tablet  3  . predniSONE (DELTASONE) 10 MG tablet Take one pill daily for 5 days, take 1/2 pill daily for 10 days, then take 1/2 pill every other day for 10 days and then stop.  20 tablet  0  . prochlorperazine (COMPAZINE) 10 MG tablet Take 1 tablet (10 mg total) by mouth every 6 (six) hours as needed for nausea or vomiting.  60 tablet  0  . ranitidine (ZANTAC) 300 MG tablet Take 1 tablet (300 mg total) by mouth at bedtime. For acid reflux and indigestion  30 tablet  99  . sucralfate (CARAFATE) 1 G tablet Take 1 tablet (1 g total) by mouth 4 (four) times daily. Dissolve in water and swallow  120 tablet  99  . traMADol (ULTRAM) 50 MG tablet Take 1 tablet (50 mg total) by mouth every 6 (six) hours as needed.  30 tablet  0  . Umeclidinium-Vilanterol 62.5-25 MCG/INH AEPB Use daily      . zolpidem (AMBIEN) 5 MG tablet TAKE 1 TABLET BY MOUTH AT BEDTIME AS NEEDED FOR SLEEP.  30 tablet  0   No current facility-administered medications for this visit.   Facility-Administered Medications Ordered in Other Visits  Medication Dose Route Frequency Provider Last Rate Last Dose  . 0.9 %  sodium chloride infusion   Intravenous Once Adrena E Johnson, PA-C      . sodium chloride 0.9 % injection 10 mL  10 mL Intracatheter PRN Curt Bears, MD   10 mL at 01/05/13 1825  . sodium chloride 0.9 % injection 10  mL  10 mL Intracatheter PRN Adrena E Johnson, PA-C      . sodium chloride 0.9 % injection 10 mL  10 mL Intravenous PRN Curt Bears, MD       REVIEW OF SYSTEMS: Valu.Nieves ] denotes positive finding; [  ] denotes negative finding  CARDIOVASCULAR:  [ ]  chest pain   [ ]  chest pressure   Valu.Nieves ] palpitations   Valu.Nieves ] orthopnea   Valu.Nieves ] dyspnea on exertion   [ ]  claudication   [ ]  rest pain   [ ]  DVT   [ ]  phlebitis PULMONARY:   [ ]  productive cough   Valu.Nieves ] asthma   Valu.Nieves ] wheezing NEUROLOGIC:   [ ]  weakness  [ ]  paresthesias  [ ]  aphasia  [ ]  amaurosis  [ ]  dizziness HEMATOLOGIC:   [ ]  bleeding problems   [ ]  clotting disorders MUSCULOSKELETAL:  [ ]  joint pain   [ ]  joint swelling Valu.Nieves ] leg swelling GASTROINTESTINAL: [ ]   blood in stool  [ ]   hematemesis GENITOURINARY:  [ ]   dysuria  [ ]   hematuria PSYCHIATRIC:  Valu.Nieves ] history of major depression INTEGUMENTARY:  [ ]  rashes  [ ]  ulcers CONSTITUTIONAL:  [ ]  fever   [ ]  chills  PHYSICAL EXAM: Filed Vitals:   06/10/13 1223  BP: 108/54  Pulse: 75  Resp: 16  Height: 5' 5.5" (1.664 m)  Weight: 184 lb (83.462 kg)   Body mass index is 30.14 kg/(m^2). GENERAL: The patient is a well-nourished female, in no acute distress. The vital signs are documented above. CARDIOVASCULAR: There is a regular rate and rhythm. I do not detect carotid bruits. She has palpable brachial and radial pulses bilaterally. PULMONARY: There is good air exchange bilaterally without wheezing or rales. ABDOMEN: Soft and non-tender with normal pitched bowel sounds.  MUSCULOSKELETAL: There are no major deformities or cyanosis. NEUROLOGIC: No focal weakness or paresthesias are detected. SKIN: There are no ulcers or rashes noted. PSYCHIATRIC: The patient has a normal affect.  DATA:  I have independently interpreted her arterial Doppler study which shows triphasic Doppler signals in the radial and ulnar positions bilaterally. The left brachial artery is 0.39 cm in diameter. I have independently  interpreted her vein mapping. On the left upper extremity, the forearm and upper arm cephalic vein looked reasonable in size. The basilic vein looks reasonable in size.  MEDICAL ISSUES:  Chronic kidney disease, stage IV (severe) The patient appears to be a good candidate for an AV fistula in the left arm. She feels very strongly about not having a fistula in her forearm. On exam and the vein in her forearm has multiple branches and I think it would be reasonable to do an upper arm brachiocephalic AV fistula.I have explained the indications for placement of an AV fistula or AV graft. I've explained that if at all possible we will place an AV fistula.  I have reviewed the risks of placement of an AV fistula including but not limited to: failure of the fistula to mature, need for subsequent interventions, and thrombosis. All the patient's questions were answered and they are agreeable to proceed with surgery. This has been scheduled for 06/19/2013.    Angelia Mould [facility] [place] Beeper: [phone]

## 2013-06-10 NOTE — Assessment & Plan Note (Signed)
The patient appears to be a good candidate for an AV fistula in the left arm. She feels very strongly about not having a fistula in her forearm. On exam and the vein in her forearm has multiple branches and I think it would be reasonable to do an upper arm brachiocephalic AV fistula.I have explained the indications for placement of an AV fistula or AV graft. I've explained that if at all possible we will place an AV fistula.  I have reviewed the risks of placement of an AV fistula including but not limited to: failure of the fistula to mature, need for subsequent interventions, and thrombosis. All the patient's questions were answered and they are agreeable to proceed with surgery. This has been scheduled for 06/19/2013.

## 2013-06-15 ENCOUNTER — Encounter (HOSPITAL_COMMUNITY): Payer: Self-pay | Admitting: Pharmacist

## 2013-06-18 ENCOUNTER — Encounter (HOSPITAL_COMMUNITY): Payer: Self-pay | Admitting: *Deleted

## 2013-06-18 NOTE — Progress Notes (Signed)
Nancy Norris reported that she experience chest pain Sunday, May 31 and Monday, June 1.  Pain Sunday night awaken her from sleep, it was 10/10, squeezing pain,she was SOB and nauseous. Pain was relived by  1 NTG.  Pain on Monday was a shooting pain, it was also relived by 1 NTG. Nancy Norris was supposed to be taking Imdur 30 mg everyday, but stopped this medication in December because it cause sever headache. Patient started taking 15 mg daily this week and has not had any headache.  Patient had a normal stress test in Feb of this year.  I notified Dr Conrad Loch Sheldrake of above information and he said that she will be evaluated in am, no new orders.

## 2013-06-18 NOTE — Progress Notes (Signed)
06/18/13 1725  OBSTRUCTIVE SLEEP APNEA  Have you ever been diagnosed with sleep apnea through a sleep study? No  Do you snore loudly (loud enough to be heard through closed doors)?  1  Do you often feel tired, fatigued, or sleepy during the daytime? 1  Has anyone observed you stop breathing during your sleep? 1  Do you have, or are you being treated for high blood pressure? 0  BMI more than 35 kg/m2? 0  Age over 72 years old? 1  Gender: 0  Obstructive Sleep Apnea Score 4  Score 4 or greater  Results sent to PCP

## 2013-06-19 ENCOUNTER — Encounter (HOSPITAL_COMMUNITY)
Admission: RE | Disposition: A | Discharge status: Home / Self Care | Payer: Self-pay | Source: Ambulatory Visit | Attending: Vascular Surgery

## 2013-06-19 ENCOUNTER — Encounter (HOSPITAL_COMMUNITY): Payer: Self-pay | Admitting: *Deleted

## 2013-06-19 ENCOUNTER — Other Ambulatory Visit: Payer: Self-pay

## 2013-06-19 ENCOUNTER — Telehealth: Payer: Self-pay | Admitting: Vascular Surgery

## 2013-06-19 ENCOUNTER — Encounter (HOSPITAL_COMMUNITY): Payer: Medicare Other | Admitting: Certified Registered Nurse Anesthetist

## 2013-06-19 ENCOUNTER — Ambulatory Visit (HOSPITAL_COMMUNITY)
Admission: RE | Admit: 2013-06-19 | Discharge: 2013-06-19 | Disposition: A | Discharge status: Home / Self Care | Payer: Medicare Other | Source: Ambulatory Visit | Attending: Vascular Surgery | Admitting: Vascular Surgery

## 2013-06-19 ENCOUNTER — Ambulatory Visit (HOSPITAL_COMMUNITY): Payer: Medicare Other | Admitting: Certified Registered Nurse Anesthetist

## 2013-06-19 DIAGNOSIS — E871 Hypo-osmolality and hyponatremia: Secondary | ICD-10-CM | POA: Insufficient documentation

## 2013-06-19 DIAGNOSIS — K219 Gastro-esophageal reflux disease without esophagitis: Secondary | ICD-10-CM | POA: Insufficient documentation

## 2013-06-19 DIAGNOSIS — E875 Hyperkalemia: Secondary | ICD-10-CM | POA: Insufficient documentation

## 2013-06-19 DIAGNOSIS — J449 Chronic obstructive pulmonary disease, unspecified: Secondary | ICD-10-CM | POA: Insufficient documentation

## 2013-06-19 DIAGNOSIS — Z885 Allergy status to narcotic agent status: Secondary | ICD-10-CM | POA: Insufficient documentation

## 2013-06-19 DIAGNOSIS — Z91018 Allergy to other foods: Secondary | ICD-10-CM | POA: Insufficient documentation

## 2013-06-19 DIAGNOSIS — Z91040 Latex allergy status: Secondary | ICD-10-CM | POA: Insufficient documentation

## 2013-06-19 DIAGNOSIS — N183 Chronic kidney disease, stage 3 unspecified: Secondary | ICD-10-CM

## 2013-06-19 DIAGNOSIS — M129 Arthropathy, unspecified: Secondary | ICD-10-CM | POA: Insufficient documentation

## 2013-06-19 DIAGNOSIS — J4489 Other specified chronic obstructive pulmonary disease: Secondary | ICD-10-CM | POA: Insufficient documentation

## 2013-06-19 DIAGNOSIS — N186 End stage renal disease: Secondary | ICD-10-CM

## 2013-06-19 DIAGNOSIS — C9000 Multiple myeloma not having achieved remission: Secondary | ICD-10-CM | POA: Insufficient documentation

## 2013-06-19 DIAGNOSIS — Z79899 Other long term (current) drug therapy: Secondary | ICD-10-CM | POA: Insufficient documentation

## 2013-06-19 DIAGNOSIS — Z91041 Radiographic dye allergy status: Secondary | ICD-10-CM | POA: Insufficient documentation

## 2013-06-19 DIAGNOSIS — N184 Chronic kidney disease, stage 4 (severe): Secondary | ICD-10-CM | POA: Insufficient documentation

## 2013-06-19 DIAGNOSIS — N185 Chronic kidney disease, stage 5: Secondary | ICD-10-CM

## 2013-06-19 DIAGNOSIS — IMO0001 Reserved for inherently not codable concepts without codable children: Secondary | ICD-10-CM | POA: Insufficient documentation

## 2013-06-19 DIAGNOSIS — Z882 Allergy status to sulfonamides status: Secondary | ICD-10-CM | POA: Insufficient documentation

## 2013-06-19 DIAGNOSIS — I129 Hypertensive chronic kidney disease with stage 1 through stage 4 chronic kidney disease, or unspecified chronic kidney disease: Secondary | ICD-10-CM | POA: Insufficient documentation

## 2013-06-19 DIAGNOSIS — Z9221 Personal history of antineoplastic chemotherapy: Secondary | ICD-10-CM | POA: Insufficient documentation

## 2013-06-19 DIAGNOSIS — Z87891 Personal history of nicotine dependence: Secondary | ICD-10-CM | POA: Insufficient documentation

## 2013-06-19 DIAGNOSIS — Z853 Personal history of malignant neoplasm of breast: Secondary | ICD-10-CM | POA: Insufficient documentation

## 2013-06-19 DIAGNOSIS — D649 Anemia, unspecified: Secondary | ICD-10-CM | POA: Insufficient documentation

## 2013-06-19 DIAGNOSIS — Z48812 Encounter for surgical aftercare following surgery on the circulatory system: Secondary | ICD-10-CM

## 2013-06-19 DIAGNOSIS — E039 Hypothyroidism, unspecified: Secondary | ICD-10-CM | POA: Insufficient documentation

## 2013-06-19 HISTORY — PX: AV FISTULA PLACEMENT: SHX1204

## 2013-06-19 HISTORY — DX: Personal history of other medical treatment: Z92.89

## 2013-06-19 HISTORY — DX: Adverse effect of unspecified anesthetic, initial encounter: T41.45XA

## 2013-06-19 HISTORY — DX: Other complications of anesthesia, initial encounter: T88.59XA

## 2013-06-19 HISTORY — DX: Anemia, unspecified: D64.9

## 2013-06-19 HISTORY — DX: Anxiety disorder, unspecified: F41.9

## 2013-06-19 HISTORY — DX: Angina pectoris, unspecified: I20.9

## 2013-06-19 HISTORY — DX: Polyneuropathy, unspecified: G62.9

## 2013-06-19 HISTORY — DX: Headache: R51

## 2013-06-19 HISTORY — DX: Other specified postprocedural states: Z98.890

## 2013-06-19 HISTORY — DX: Acute myocardial infarction, unspecified: I21.9

## 2013-06-19 HISTORY — DX: Nausea with vomiting, unspecified: R11.2

## 2013-06-19 HISTORY — DX: Major depressive disorder, single episode, unspecified: F32.9

## 2013-06-19 HISTORY — DX: Depression, unspecified: F32.A

## 2013-06-19 HISTORY — DX: Gastro-esophageal reflux disease without esophagitis: K21.9

## 2013-06-19 LAB — POCT I-STAT 4, (NA,K, GLUC, HGB,HCT)
Glucose, Bld: 103 mg/dL — ABNORMAL HIGH (ref 70–99)
HCT: 27 % — ABNORMAL LOW (ref 36.0–46.0)
HEMOGLOBIN: 9.2 g/dL — AB (ref 12.0–15.0)
POTASSIUM: 3.3 meq/L — AB (ref 3.7–5.3)
Sodium: 142 mEq/L (ref 137–147)

## 2013-06-19 SURGERY — ARTERIOVENOUS (AV) FISTULA CREATION
Anesthesia: General | Site: Arm Lower | Laterality: Left

## 2013-06-19 MED ORDER — PROPOFOL 10 MG/ML IV BOLUS
INTRAVENOUS | Status: DC | PRN
  Administered 2013-06-19 (×2): 50 mg via INTRAVENOUS
  Administered 2013-06-19: 100 mg via INTRAVENOUS

## 2013-06-19 MED ORDER — SUCCINYLCHOLINE CHLORIDE 20 MG/ML IJ SOLN
INTRAMUSCULAR | Status: DC | PRN
  Administered 2013-06-19: 100 mg via INTRAVENOUS

## 2013-06-19 MED ORDER — SODIUM CHLORIDE 0.9 % IV SOLN: INTRAVENOUS | Status: DC

## 2013-06-19 MED ORDER — FENTANYL CITRATE 0.05 MG/ML IJ SOLN
INTRAMUSCULAR | Status: AC
  Filled 2013-06-19: qty 5

## 2013-06-19 MED ORDER — LIDOCAINE HCL (CARDIAC) 20 MG/ML IV SOLN
INTRAVENOUS | Status: DC | PRN
  Administered 2013-06-19: 20 mg via INTRAVENOUS

## 2013-06-19 MED ORDER — CHLORHEXIDINE GLUCONATE CLOTH 2 % EX PADS: 6.0000 | MEDICATED_PAD | Freq: Once | CUTANEOUS | Status: DC

## 2013-06-19 MED ORDER — HEPARIN SODIUM (PORCINE) 1000 UNIT/ML IJ SOLN
INTRAMUSCULAR | Status: DC | PRN
  Administered 2013-06-19: 6000 [IU] via INTRAVENOUS

## 2013-06-19 MED ORDER — ARTIFICIAL TEARS OP OINT
TOPICAL_OINTMENT | OPHTHALMIC | Status: DC | PRN
  Administered 2013-06-19: 1 via OPHTHALMIC

## 2013-06-19 MED ORDER — DIPHENHYDRAMINE HCL 50 MG/ML IJ SOLN
INTRAMUSCULAR | Status: DC | PRN
  Administered 2013-06-19: 12.5 mg via INTRAVENOUS

## 2013-06-19 MED ORDER — FENTANYL CITRATE 0.05 MG/ML IJ SOLN
INTRAMUSCULAR | Status: DC | PRN
  Administered 2013-06-19 (×2): 50 ug via INTRAVENOUS

## 2013-06-19 MED ORDER — 0.9 % SODIUM CHLORIDE (POUR BTL) OPTIME
TOPICAL | Status: DC | PRN
  Administered 2013-06-19: 1000 mL

## 2013-06-19 MED ORDER — PROTAMINE SULFATE 10 MG/ML IV SOLN
INTRAVENOUS | Status: AC
  Filled 2013-06-19: qty 5

## 2013-06-19 MED ORDER — SODIUM CHLORIDE 0.9 % IV SOLN
INTRAVENOUS | Status: DC | PRN
  Administered 2013-06-19: 07:00:00 via INTRAVENOUS

## 2013-06-19 MED ORDER — PHENYLEPHRINE HCL 10 MG/ML IJ SOLN
INTRAMUSCULAR | Status: DC | PRN
  Administered 2013-06-19: 40 ug via INTRAVENOUS
  Administered 2013-06-19: 80 ug via INTRAVENOUS

## 2013-06-19 MED ORDER — MIDAZOLAM HCL 5 MG/5ML IJ SOLN
INTRAMUSCULAR | Status: DC | PRN
  Administered 2013-06-19: 2 mg via INTRAVENOUS

## 2013-06-19 MED ORDER — PAPAVERINE HCL 30 MG/ML IJ SOLN
INTRAMUSCULAR | Status: AC
  Filled 2013-06-19: qty 2

## 2013-06-19 MED ORDER — DIPHENHYDRAMINE HCL 50 MG/ML IJ SOLN
INTRAMUSCULAR | Status: AC
  Filled 2013-06-19: qty 1

## 2013-06-19 MED ORDER — PHENYLEPHRINE 40 MCG/ML (10ML) SYRINGE FOR IV PUSH (FOR BLOOD PRESSURE SUPPORT)
PREFILLED_SYRINGE | INTRAVENOUS | Status: AC
  Filled 2013-06-19: qty 10

## 2013-06-19 MED ORDER — DEXAMETHASONE SODIUM PHOSPHATE 4 MG/ML IJ SOLN
INTRAMUSCULAR | Status: AC
  Filled 2013-06-19: qty 2

## 2013-06-19 MED ORDER — SODIUM CHLORIDE 0.9 % IR SOLN
Status: DC | PRN
  Administered 2013-06-19: 08:00:00

## 2013-06-19 MED ORDER — PROPOFOL 10 MG/ML IV BOLUS
INTRAVENOUS | Status: AC
  Filled 2013-06-19: qty 20

## 2013-06-19 MED ORDER — MIDAZOLAM HCL 2 MG/2ML IJ SOLN
INTRAMUSCULAR | Status: AC
  Filled 2013-06-19: qty 2

## 2013-06-19 MED ORDER — OXYCODONE HCL 5 MG PO TABS
ORAL_TABLET | ORAL | Status: AC
  Filled 2013-06-19: qty 1

## 2013-06-19 MED ORDER — DEXTROSE 5 % IV SOLN
1.5000 g | INTRAVENOUS | Status: AC
  Administered 2013-06-19: 1.5 g via INTRAVENOUS
  Filled 2013-06-19: qty 1.5

## 2013-06-19 MED ORDER — THROMBIN 20000 UNITS EX SOLR
CUTANEOUS | Status: AC
  Filled 2013-06-19: qty 20000

## 2013-06-19 MED ORDER — EPHEDRINE SULFATE 50 MG/ML IJ SOLN
INTRAMUSCULAR | Status: DC | PRN
  Administered 2013-06-19 (×4): 5 mg via INTRAVENOUS
  Administered 2013-06-19: 10 mg via INTRAVENOUS

## 2013-06-19 MED ORDER — ONDANSETRON HCL 4 MG/2ML IJ SOLN
INTRAMUSCULAR | Status: DC | PRN
  Administered 2013-06-19: 4 mg via INTRAVENOUS

## 2013-06-19 MED ORDER — DEXAMETHASONE SODIUM PHOSPHATE 4 MG/ML IJ SOLN
INTRAMUSCULAR | Status: DC | PRN
  Administered 2013-06-19: 8 mg via INTRAVENOUS

## 2013-06-19 MED ORDER — HEPARIN SODIUM (PORCINE) 1000 UNIT/ML IJ SOLN
INTRAMUSCULAR | Status: AC
  Filled 2013-06-19: qty 1

## 2013-06-19 MED ORDER — PROTAMINE SULFATE 10 MG/ML IV SOLN
INTRAVENOUS | Status: DC | PRN
  Administered 2013-06-19 (×3): 10 mg via INTRAVENOUS

## 2013-06-19 SURGICAL SUPPLY — 47 items
ADH SKN CLS APL DERMABOND .7 (GAUZE/BANDAGES/DRESSINGS) ×1
ARMBAND PINK RESTRICT EXTREMIT (MISCELLANEOUS) ×3 IMPLANT
BLADE 10 SAFETY STRL DISP (BLADE) ×3 IMPLANT
CANISTER SUCTION 2500CC (MISCELLANEOUS) ×3 IMPLANT
CLIP TI MEDIUM 6 (CLIP) ×3 IMPLANT
CLIP TI WIDE RED SMALL 6 (CLIP) ×5 IMPLANT
COVER PROBE W GEL 5X96 (DRAPES) IMPLANT
COVER SURGICAL LIGHT HANDLE (MISCELLANEOUS) ×3 IMPLANT
DECANTER SPIKE VIAL GLASS SM (MISCELLANEOUS) ×1 IMPLANT
DERMABOND ADVANCED (GAUZE/BANDAGES/DRESSINGS) ×2
DERMABOND ADVANCED .7 DNX12 (GAUZE/BANDAGES/DRESSINGS) ×1 IMPLANT
DRAIN PENROSE 1/2X12 LTX STRL (WOUND CARE) IMPLANT
DRAPE ORTHO SPLIT 77X108 STRL (DRAPES) ×3
DRAPE SURG ORHT 6 SPLT 77X108 (DRAPES) IMPLANT
ELECT REM PT RETURN 9FT ADLT (ELECTROSURGICAL) ×3
ELECTRODE REM PT RTRN 9FT ADLT (ELECTROSURGICAL) ×1 IMPLANT
GLOVE BIO SURGEON STRL SZ7.5 (GLOVE) ×1 IMPLANT
GLOVE BIOGEL PI IND STRL 6.5 (GLOVE) IMPLANT
GLOVE BIOGEL PI IND STRL 7.0 (GLOVE) IMPLANT
GLOVE BIOGEL PI IND STRL 8 (GLOVE) ×1 IMPLANT
GLOVE BIOGEL PI INDICATOR 6.5 (GLOVE) ×4
GLOVE BIOGEL PI INDICATOR 7.0 (GLOVE) ×2
GLOVE BIOGEL PI INDICATOR 8 (GLOVE) ×2
GLOVE SURG SS PI 6.5 STRL IVOR (GLOVE) ×6 IMPLANT
GLOVE SURG SS PI 7.0 STRL IVOR (GLOVE) ×2 IMPLANT
GLOVE SURG SS PI 7.5 STRL IVOR (GLOVE) ×2 IMPLANT
GOWN STRL REUS W/ TWL LRG LVL3 (GOWN DISPOSABLE) ×3 IMPLANT
GOWN STRL REUS W/ TWL XL LVL3 (GOWN DISPOSABLE) IMPLANT
GOWN STRL REUS W/TWL LRG LVL3 (GOWN DISPOSABLE) ×9
GOWN STRL REUS W/TWL XL LVL3 (GOWN DISPOSABLE) ×3
KIT BASIN OR (CUSTOM PROCEDURE TRAY) ×3 IMPLANT
KIT ROOM TURNOVER OR (KITS) ×3 IMPLANT
NS IRRIG 1000ML POUR BTL (IV SOLUTION) ×3 IMPLANT
PACK CAROTID (CUSTOM PROCEDURE TRAY) ×2 IMPLANT
PACK CV ACCESS (CUSTOM PROCEDURE TRAY) ×1 IMPLANT
PAD ARMBOARD 7.5X6 YLW CONV (MISCELLANEOUS) ×6 IMPLANT
SPONGE SURGIFOAM ABS GEL 100 (HEMOSTASIS) IMPLANT
STOCKINETTE 6  STRL (DRAPES) ×2
STOCKINETTE 6 STRL (DRAPES) IMPLANT
SUT PROLENE 6 0 BV (SUTURE) ×3 IMPLANT
SUT VIC AB 3-0 SH 27 (SUTURE) ×3
SUT VIC AB 3-0 SH 27X BRD (SUTURE) ×1 IMPLANT
SUT VICRYL 4-0 PS2 18IN ABS (SUTURE) ×3 IMPLANT
TOWEL OR 17X24 6PK STRL BLUE (TOWEL DISPOSABLE) ×3 IMPLANT
TOWEL OR 17X26 10 PK STRL BLUE (TOWEL DISPOSABLE) ×3 IMPLANT
UNDERPAD 30X30 INCONTINENT (UNDERPADS AND DIAPERS) ×3 IMPLANT
WATER STERILE IRR 1000ML POUR (IV SOLUTION) ×3 IMPLANT

## 2013-06-19 NOTE — Anesthesia Preprocedure Evaluation (Signed)
Anesthesia Evaluation  Patient identified by MRN, date of birth, ID band Patient awake    Reviewed: Allergy & Precautions, H&P , NPO status , Patient's Chart, lab work & pertinent test results, reviewed documented beta blocker date and time   History of Anesthesia Complications (+) PONV and history of anesthetic complications  Airway Mallampati: II TM Distance: >3 FB Neck ROM: Full    Dental  (+) Chipped, Dental Advisory Given   Pulmonary asthma , COPD COPD inhaler, former smoker (quit '07),  breath sounds clear to auscultation        Cardiovascular hypertension, Rhythm:Regular Rate:Normal  2/15 ECHO: EF 55-60% 2/15 myoview: EF 72%, no ischemia   Neuro/Psych  Headaches (migraine presently),    GI/Hepatic GERD-  Medicated and Poorly Controlled,  Endo/Other  Hypothyroidism Morbid obesity  Renal/GU CRFRenal disease (K+ 3.3)     Musculoskeletal   Abdominal (+) + obese,   Peds  Hematology  (+) Blood dyscrasia (Hb 9.2), anemia ,   Anesthesia Other Findings   Reproductive/Obstetrics                           Anesthesia Physical Anesthesia Plan  ASA: III  Anesthesia Plan: General   Post-op Pain Management:    Induction: Intravenous  Airway Management Planned: Oral ETT  Additional Equipment:   Intra-op Plan:   Post-operative Plan: Extubation in OR  Informed Consent: I have reviewed the patients History and Physical, chart, labs and discussed the procedure including the risks, benefits and alternatives for the proposed anesthesia with the patient or authorized representative who has indicated his/her understanding and acceptance.   Dental advisory given  Plan Discussed with: CRNA and Surgeon  Anesthesia Plan Comments: (Plan routine monitors, GETA)        Anesthesia Quick Evaluation

## 2013-06-19 NOTE — Transfer of Care (Signed)
Immediate Anesthesia Transfer of Care Note  Patient: Nancy Norris  Procedure(s) Performed: Procedure(s): CREATION OF LEFT ARM ARTERIOVENOUS (AV) FISTULA  (Left)  Patient Location: PACU  Anesthesia Type:General  Level of Consciousness: awake, alert , oriented and patient cooperative  Airway & Oxygen Therapy: Patient Spontanous Breathing  Post-op Assessment: Report given to PACU RN, Post -op Vital signs reviewed and stable and Patient moving all extremities X 4  Post vital signs: Reviewed and stable  Complications: No apparent anesthesia complications

## 2013-06-19 NOTE — Anesthesia Postprocedure Evaluation (Signed)
  Anesthesia Post-op Note  Patient: Nancy Norris  Procedure(s) Performed: Procedure(s): CREATION OF LEFT ARM ARTERIOVENOUS (AV) FISTULA  (Left)  Patient Location: PACU  Anesthesia Type:General  Level of Consciousness: awake, alert , oriented and patient cooperative  Airway and Oxygen Therapy: Patient Spontanous Breathing  Post-op Pain: mild  Post-op Assessment: Post-op Vital signs reviewed, Patient's Cardiovascular Status Stable, Respiratory Function Stable, Patent Airway, No signs of Nausea or vomiting and Pain level controlled  Post-op Vital Signs: Reviewed and stable  Last Vitals:  Filed Vitals:   06/19/13 0945  BP: 128/65  Pulse: 76  Temp:   Resp:     Complications: No apparent anesthesia complications

## 2013-06-19 NOTE — Telephone Encounter (Addendum)
Message copied by Doristine Section on Fri Jun 19, 2013  2:38 PM ------      Message from: Denman George      Created: Fri Jun 19, 2013 11:02 AM      Regarding: Micheline Rough / 6 wk. f/u w/ access duplex                   ----- Message -----         From: Angelia Mould, MD         Sent: 06/19/2013   8:55 AM           To: Vvs Charge Pool      Subject: charge and f/u                                           PROCEDURE: Left brachiocephalic AV fistula                  SURGEON: Judeth Cornfield. Scot Dock, MD, FACS                  ASSIST: Leontine Locket, PA            The patient will need a follow up visit in 6 weeks with a duplex at that time to check on the maturation of the fistula. Thank you.      CD ------  notified patient of post op appt. on 07-29-13 at 8:30

## 2013-06-19 NOTE — Discharge Instructions (Signed)
° ° °  06/19/2013 ASSYRIA MORREALE [id number] [birth year]  Surgeon(s): Angelia Mould, MD  Procedure(s): CREATION OF LEFT ARM ARTERIOVENOUS (AV) FISTULA   x Do not stick graft for 12 weeks

## 2013-06-19 NOTE — Interval H&P Note (Signed)
History and Physical Interval Note:  06/19/2013 6:46 AM  Nancy Norris  has presented today for surgery, with the diagnosis of Chronic kidney disease, stage IV   The various methods of treatment have been discussed with the patient and family. After consideration of risks, benefits and other options for treatment, the patient has consented to  Procedure(s): ARTERIOVENOUS (AV) FISTULA CREATION (Left) as a surgical intervention .  The patient's history has been reviewed, patient examined, no change in status, stable for surgery.  I have reviewed the patient's chart and labs.  Questions were answered to the patient's satisfaction.     Angelia Mould

## 2013-06-19 NOTE — H&P (View-Only) (Signed)
[facility] [place]  Patient name: Nancy Norris MRN: [id number] DOB: January 13, 1941 Sex: female  REASON FOR CONSULT: Evaluate for hemodialysis access. Referred by Dr. Marval Regal  HPI: Nancy Norris is a 73 y.o. female with chronic kidney disease. This is according to the patient related to chemotherapy she received for multiple myeloma. She has stage IV chronic kidney disease. She is not yet on dialysis. She is right-handed. She denies any recent uremic symptoms. Specifically, she denies nausea, vomiting, fatigue, or anorexia.  I have reviewed her records from the referring physician's office. She has stage IV chronic kidney disease. Her most recent visit she had some increase in her creatinine.   Past Medical History  Diagnosis Date  . Hyperkalemia   . Hypothyroidism   . COPD (chronic obstructive pulmonary disease)   . Hyponatremia   . Dizziness   . Fibromyalgia   . Breast cancer   . Multiple myeloma   . Mucositis   . CKD (chronic kidney disease) stage 3, GFR 30-59 ml/min   . Hx of echocardiogram     a.  Echocardiogram (12/26/2012): EF 37-10%, grade 1 diastolic dysfunction;   b.  Echocardiogram (02/2013): EF 55-60%, no WMA, trivial effusion  . Fibromyalgia   . Arthritis   . Hx of cardiovascular stress test     LexiScan with low level exercise Myoview (02/2013): No ischemia, EF 72%; normal study   Family History  Problem Relation Age of Onset  . Arthritis Mother   . Asthma Mother   . Cancer Sister   . Hyperlipidemia Brother    SOCIAL HISTORY: History  Substance Use Topics  . Smoking status: Former Smoker -- 1.00 packs/day for 30 years    Types: Cigarettes    Quit date: 02/15/2005  . Smokeless tobacco: Never Used  . Alcohol Use: No   Allergies  Allergen Reactions  . Codeine Anaphylaxis  . Latex Shortness Of Breath    Adhesive products   . Iodinated Diagnostic Agents Itching    Happened 40 years ago  . Other     Onion, chocolate causes  headache  . Sulfa Antibiotics Itching  . Adhesive [Tape]     blisters   Current Outpatient Prescriptions  Medication Sig Dispense Refill  . acetaminophen (TYLENOL) 325 MG tablet Take 650 mg by mouth every 6 (six) hours as needed for mild pain or moderate pain.       Marland Kitchen albuterol (PROVENTIL HFA;VENTOLIN HFA) 108 (90 BASE) MCG/ACT inhaler Inhale 2 puffs into the lungs 2 (two) times daily as needed for wheezing or shortness of breath.  1 each  6  . Alum & Mag Hydroxide-Simeth (MAGIC MOUTHWASH W/LIDOCAINE) SOLN Take 5 mLs by mouth 4 (four) times daily as needed for mouth pain (swish and spit QID PRN).  240 mL  0  . ascorbic acid (VITAMIN C) 250 MG CHEW Chew 250 mg by mouth daily.      Marland Kitchen aspirin EC 81 MG tablet Take 1 tablet (81 mg total) by mouth daily.      Marland Kitchen buPROPion (WELLBUTRIN XL) 150 MG 24 hr tablet Take 1 tablet (150 mg total) by mouth every morning. For mood and depression  30 tablet  99  . carvedilol (COREG) 3.125 MG tablet       . cetirizine (ZYRTEC) 10 MG tablet Take 10 mg by mouth at bedtime.       . cholecalciferol (VITAMIN D) 1000 UNITS tablet Take 4,000 Units by mouth daily.      Marland Kitchen  citalopram (CELEXA) 40 MG tablet TAKE 1 TABLET BY MOUTH ONCE DAILY.  90 tablet  0  . cromolyn (OPTICROM) 4 % ophthalmic solution Place 1 drop into both eyes 4 (four) times daily.      . cyanocobalamin 2000 MCG tablet Take 2,000 mcg by mouth daily.       Marland Kitchen dexamethasone (DECADRON) 4 MG tablet       . esomeprazole (NEXIUM) 40 MG capsule Take 1 capsule (40 mg total) by mouth daily. Each morning for acid indigestion  30 capsule  99  . Ferrous Sulfate 134 MG TABS Take 1 tablet by mouth daily.       . fluticasone (FLONASE) 50 MCG/ACT nasal spray       . gabapentin (NEURONTIN) 300 MG capsule Take 300 mg by mouth at bedtime.      . isosorbide mononitrate (IMDUR) 30 MG 24 hr tablet       . levothyroxine (SYNTHROID, LEVOTHROID) 150 MCG tablet TAKE 1 TABLET BY MOUTH ONCE DAILY.  1 1/2 tablet on saturday and sunday       . lidocaine-prilocaine (EMLA) cream Apply 1 application topically as needed. Apply to port 1 hour before chemotherapy appt.  30 g  0  . loteprednol (LOTEMAX) 0.2 % SUSP Place 1 drop into both eyes 3 (three) times daily.       . Multiple Vitamins-Minerals (HAIR/SKIN/NAILS PO) Take 1 tablet by mouth daily.      . nitroGLYCERIN (NITROSTAT) 0.4 MG SL tablet Place 1 tablet (0.4 mg total) under the tongue every 5 (five) minutes as needed for chest pain.  25 tablet  3  . ondansetron (ZOFRAN-ODT) 8 MG disintegrating tablet Take 1 tablet (8 mg total) by mouth every 8 (eight) hours as needed for nausea or vomiting.  20 tablet  3  . predniSONE (DELTASONE) 10 MG tablet Take one pill daily for 5 days, take 1/2 pill daily for 10 days, then take 1/2 pill every other day for 10 days and then stop.  20 tablet  0  . prochlorperazine (COMPAZINE) 10 MG tablet Take 1 tablet (10 mg total) by mouth every 6 (six) hours as needed for nausea or vomiting.  60 tablet  0  . ranitidine (ZANTAC) 300 MG tablet Take 1 tablet (300 mg total) by mouth at bedtime. For acid reflux and indigestion  30 tablet  99  . sucralfate (CARAFATE) 1 G tablet Take 1 tablet (1 g total) by mouth 4 (four) times daily. Dissolve in water and swallow  120 tablet  99  . traMADol (ULTRAM) 50 MG tablet Take 1 tablet (50 mg total) by mouth every 6 (six) hours as needed.  30 tablet  0  . Umeclidinium-Vilanterol 62.5-25 MCG/INH AEPB Use daily      . zolpidem (AMBIEN) 5 MG tablet TAKE 1 TABLET BY MOUTH AT BEDTIME AS NEEDED FOR SLEEP.  30 tablet  0   No current facility-administered medications for this visit.   Facility-Administered Medications Ordered in Other Visits  Medication Dose Route Frequency Provider Last Rate Last Dose  . 0.9 %  sodium chloride infusion   Intravenous Once Adrena E Johnson, PA-C      . sodium chloride 0.9 % injection 10 mL  10 mL Intracatheter PRN Curt Bears, MD   10 mL at 01/05/13 1825  . sodium chloride 0.9 % injection 10  mL  10 mL Intracatheter PRN Adrena E Johnson, PA-C      . sodium chloride 0.9 % injection 10 mL  10 mL Intravenous PRN Curt Bears, MD       REVIEW OF SYSTEMS: Valu.Nieves ] denotes positive finding; [  ] denotes negative finding  CARDIOVASCULAR:  [ ]  chest pain   [ ]  chest pressure   Valu.Nieves ] palpitations   Valu.Nieves ] orthopnea   Valu.Nieves ] dyspnea on exertion   [ ]  claudication   [ ]  rest pain   [ ]  DVT   [ ]  phlebitis PULMONARY:   [ ]  productive cough   Valu.Nieves ] asthma   Valu.Nieves ] wheezing NEUROLOGIC:   [ ]  weakness  [ ]  paresthesias  [ ]  aphasia  [ ]  amaurosis  [ ]  dizziness HEMATOLOGIC:   [ ]  bleeding problems   [ ]  clotting disorders MUSCULOSKELETAL:  [ ]  joint pain   [ ]  joint swelling Valu.Nieves ] leg swelling GASTROINTESTINAL: [ ]   blood in stool  [ ]   hematemesis GENITOURINARY:  [ ]   dysuria  [ ]   hematuria PSYCHIATRIC:  Valu.Nieves ] history of major depression INTEGUMENTARY:  [ ]  rashes  [ ]  ulcers CONSTITUTIONAL:  [ ]  fever   [ ]  chills  PHYSICAL EXAM: Filed Vitals:   06/10/13 1223  BP: 108/54  Pulse: 75  Resp: 16  Height: 5' 5.5" (1.664 m)  Weight: 184 lb (83.462 kg)   Body mass index is 30.14 kg/(m^2). GENERAL: The patient is a well-nourished female, in no acute distress. The vital signs are documented above. CARDIOVASCULAR: There is a regular rate and rhythm. I do not detect carotid bruits. She has palpable brachial and radial pulses bilaterally. PULMONARY: There is good air exchange bilaterally without wheezing or rales. ABDOMEN: Soft and non-tender with normal pitched bowel sounds.  MUSCULOSKELETAL: There are no major deformities or cyanosis. NEUROLOGIC: No focal weakness or paresthesias are detected. SKIN: There are no ulcers or rashes noted. PSYCHIATRIC: The patient has a normal affect.  DATA:  I have independently interpreted her arterial Doppler study which shows triphasic Doppler signals in the radial and ulnar positions bilaterally. The left brachial artery is 0.39 cm in diameter. I have independently  interpreted her vein mapping. On the left upper extremity, the forearm and upper arm cephalic vein looked reasonable in size. The basilic vein looks reasonable in size.  MEDICAL ISSUES:  Chronic kidney disease, stage IV (severe) The patient appears to be a good candidate for an AV fistula in the left arm. She feels very strongly about not having a fistula in her forearm. On exam and the vein in her forearm has multiple branches and I think it would be reasonable to do an upper arm brachiocephalic AV fistula.I have explained the indications for placement of an AV fistula or AV graft. I've explained that if at all possible we will place an AV fistula.  I have reviewed the risks of placement of an AV fistula including but not limited to: failure of the fistula to mature, need for subsequent interventions, and thrombosis. All the patient's questions were answered and they are agreeable to proceed with surgery. This has been scheduled for 06/19/2013.    Angelia Mould [facility] [place] Beeper: [phone]

## 2013-06-19 NOTE — Op Note (Signed)
    NAME: [name]   MRN: [id number] DOB: Jun 04, 1940    DATE OF OPERATION: 06/19/2013  PREOP DIAGNOSIS: Stage IV chronic kidney disease  POSTOP DIAGNOSIS: same  PROCEDURE: Left brachiocephalic AV fistula  SURGEON: Judeth Cornfield. Scot Dock, MD, FACS  ASSIST: Leontine Locket, PA  ANESTHESIA: Gen.   EBL: minimal  INDICATIONS: Nancy Norris is a 73 y.o. female presents for new access.  FINDINGS: she did not want to consider a forearm AV fistula. I placed a brachiocephalic AV fistula. The vein was 4 mm. The artery was 3 mm.  TECHNIQUE: The patient was taken to the operating room and received a general anesthetic. The left upper extremity was prepped and draped in usual sterile fashion. A transverse incision was made at the antecubital level. The cephalic vein was dissected free and ligated distally. It was gently distended with heparinized saline. The brachial artery was dissected free beneath the fascia. The patient was heparinized. The brachial artery was clamped proximally and distally and a longitudinal arteriotomy was made. The vein was sewn end to side to the artery using continuous 6-0 Prolene suture. At the completion there was a good thrill in the fistula and a good radial and ulnar signal with the Doppler. The heparin was partially reversed with protamine. The wounds closed the deep layer 3-0 Vicryl and the skin closed with 4-0 Vicryl. Dermabond was applied. The patient tolerated the procedure well was transferred to the recovery room in stable condition. All needle and sponge counts were correct.  Deitra Mayo, MD, FACS [facility]  DATE OF DICTATION:   06/19/2013

## 2013-06-23 ENCOUNTER — Encounter (HOSPITAL_COMMUNITY): Payer: Self-pay | Admitting: Vascular Surgery

## 2013-06-25 ENCOUNTER — Ambulatory Visit: Payer: Self-pay | Admitting: Emergency Medicine

## 2013-07-06 ENCOUNTER — Other Ambulatory Visit: Payer: Self-pay | Admitting: *Deleted

## 2013-07-06 MED ORDER — GABAPENTIN 300 MG PO CAPS: 300.0000 mg | ORAL_CAPSULE | Freq: Every day | ORAL | Status: DC

## 2013-07-07 ENCOUNTER — Other Ambulatory Visit (HOSPITAL_BASED_OUTPATIENT_CLINIC_OR_DEPARTMENT_OTHER): Payer: Medicare Other

## 2013-07-07 DIAGNOSIS — C9002 Multiple myeloma in relapse: Secondary | ICD-10-CM

## 2013-07-07 LAB — LACTATE DEHYDROGENASE (CC13): LDH: 191 U/L (ref 125–245)

## 2013-07-07 LAB — CBC WITH DIFFERENTIAL/PLATELET
BASO%: 0.5 % (ref 0.0–2.0)
Basophils Absolute: 0 10*3/uL (ref 0.0–0.1)
EOS ABS: 0.3 10*3/uL (ref 0.0–0.5)
EOS%: 4 % (ref 0.0–7.0)
HCT: 32.3 % — ABNORMAL LOW (ref 34.8–46.6)
HGB: 10.6 g/dL — ABNORMAL LOW (ref 11.6–15.9)
LYMPH%: 13.8 % — ABNORMAL LOW (ref 14.0–49.7)
MCH: 34.8 pg — ABNORMAL HIGH (ref 25.1–34.0)
MCHC: 33 g/dL (ref 31.5–36.0)
MCV: 105.6 fL — ABNORMAL HIGH (ref 79.5–101.0)
MONO#: 0.8 10*3/uL (ref 0.1–0.9)
MONO%: 10.3 % (ref 0.0–14.0)
NEUT#: 5.8 10*3/uL (ref 1.5–6.5)
NEUT%: 71.4 % (ref 38.4–76.8)
Platelets: 226 10*3/uL (ref 145–400)
RBC: 3.06 10*6/uL — AB (ref 3.70–5.45)
RDW: 13.6 % (ref 11.2–14.5)
WBC: 8.2 10*3/uL (ref 3.9–10.3)
lymph#: 1.1 10*3/uL (ref 0.9–3.3)

## 2013-07-07 LAB — COMPREHENSIVE METABOLIC PANEL (CC13)
ALT: 17 U/L (ref 0–55)
ANION GAP: 8 meq/L (ref 3–11)
AST: 19 U/L (ref 5–34)
Albumin: 3.6 g/dL (ref 3.5–5.0)
Alkaline Phosphatase: 124 U/L (ref 40–150)
BUN: 21.1 mg/dL (ref 7.0–26.0)
CO2: 22 meq/L (ref 22–29)
Calcium: 9 mg/dL (ref 8.4–10.4)
Chloride: 109 mEq/L (ref 98–109)
Creatinine: 2.9 mg/dL — ABNORMAL HIGH (ref 0.6–1.1)
GLUCOSE: 125 mg/dL (ref 70–140)
Potassium: 3.2 mEq/L — ABNORMAL LOW (ref 3.5–5.1)
SODIUM: 139 meq/L (ref 136–145)
Total Bilirubin: 0.4 mg/dL (ref 0.20–1.20)
Total Protein: 6.2 g/dL — ABNORMAL LOW (ref 6.4–8.3)

## 2013-07-08 ENCOUNTER — Other Ambulatory Visit (HOSPITAL_COMMUNITY): Payer: Self-pay | Admitting: Cardiology

## 2013-07-08 ENCOUNTER — Encounter: Payer: Self-pay | Admitting: Emergency Medicine

## 2013-07-08 DIAGNOSIS — N189 Chronic kidney disease, unspecified: Secondary | ICD-10-CM

## 2013-07-09 ENCOUNTER — Ambulatory Visit (HOSPITAL_COMMUNITY): Payer: Medicare Other | Attending: Internal Medicine | Admitting: Cardiology

## 2013-07-09 DIAGNOSIS — N189 Chronic kidney disease, unspecified: Secondary | ICD-10-CM

## 2013-07-09 DIAGNOSIS — N179 Acute kidney failure, unspecified: Secondary | ICD-10-CM

## 2013-07-09 DIAGNOSIS — N184 Chronic kidney disease, stage 4 (severe): Secondary | ICD-10-CM | POA: Insufficient documentation

## 2013-07-09 LAB — KAPPA/LAMBDA LIGHT CHAINS
Kappa free light chain: 2.01 mg/dL — ABNORMAL HIGH (ref 0.33–1.94)
Kappa:Lambda Ratio: 2.05 — ABNORMAL HIGH (ref 0.26–1.65)
Lambda Free Lght Chn: 0.98 mg/dL (ref 0.57–2.63)

## 2013-07-09 LAB — IGG, IGA, IGM
IGA: 16 mg/dL — AB (ref 69–380)
IgG (Immunoglobin G), Serum: 496 mg/dL — ABNORMAL LOW (ref 690–1700)
IgM, Serum: 20 mg/dL — ABNORMAL LOW (ref 52–322)

## 2013-07-09 LAB — BETA 2 MICROGLOBULIN, SERUM: BETA 2 MICROGLOBULIN: 10.5 mg/L — AB (ref <=2.51)

## 2013-07-09 NOTE — Progress Notes (Signed)
Renal artery duplex performed  

## 2013-07-14 ENCOUNTER — Telehealth: Payer: Self-pay | Admitting: Internal Medicine

## 2013-07-14 ENCOUNTER — Encounter: Payer: Self-pay | Admitting: Internal Medicine

## 2013-07-14 ENCOUNTER — Ambulatory Visit (HOSPITAL_BASED_OUTPATIENT_CLINIC_OR_DEPARTMENT_OTHER): Payer: Medicare Other | Admitting: Internal Medicine

## 2013-07-14 VITALS — BP 132/66 | HR 75 | Temp 98.5°F | Resp 18 | Ht 65.0 in | Wt 185.2 lb

## 2013-07-14 DIAGNOSIS — R5381 Other malaise: Secondary | ICD-10-CM

## 2013-07-14 DIAGNOSIS — C9002 Multiple myeloma in relapse: Secondary | ICD-10-CM

## 2013-07-14 DIAGNOSIS — R5383 Other fatigue: Secondary | ICD-10-CM

## 2013-07-14 DIAGNOSIS — E876 Hypokalemia: Secondary | ICD-10-CM

## 2013-07-14 NOTE — Progress Notes (Signed)
[place]  [phone] [phone]  OFFICE PROGRESS NOTE   PRINCIPAL DIAGNOSES:  1. Recurrent multiple myeloma initially diagnosed in 2002, initially with smoldering myeloma at [facility].  2. Ductal carcinoma in situ status post mastectomy with sentinel lymph node biopsy in October 2008.  PRIOR THERAPY:  1. Status post treatment with tamoxifen from November 2008 through February 2009, discontinued secondary to intolerance. 2. Status post 3 cycles of chemotherapy with Revlimid and Decadron followed by 1 cycle of Decadron only with mild response. 3. Status post 2 cycles of systemic chemotherapy with Velcade, Doxil and Decadron discontinued secondary to significant peripheral neuropathy. Last dose was given May 2010 at [facility]. 4. Status post autologous peripheral blood stem cell transplant on October 01, 2008 at [facility] under the care of Dr. Phyllis Ginger.  5. Systemic chemotherapy with Carfilzomib initially was 20 mg/M2 and will be increased after cycle #1 to 36 mg/M2 on days 1, 2, 8, 9, 15 and 16 every 4 weeks in addition to Cytoxan 300 mg/M2 and Decadron 40 mg by mouth weekly basis, status post 4 cycles. First cycle on 12/29/2012.    CURRENT THERAPY: Observation.   INTERVAL HISTORY: Nancy Norris 72 y.o. female returns to the clinic today for followup visit. The patient has been observation for the last 2 months. She is feeling fine today with no specific complaints except for mild fatigue. She had a left arm fistula performed in preparation for hemodialysis if needed. She denied having any significant nausea or vomiting, no fever or chills. The patient denied having any chest pain, shortness breath, cough or hemoptysis. No significant weight loss or night sweats. She had repeat myeloma panel performed recently and she is here for evaluation and discussion of her lab results.   MEDICAL HISTORY: Past Medical History   Diagnosis Date  . Hyperkalemia   . Hypothyroidism   . COPD (chronic obstructive pulmonary disease)   . Hyponatremia   . Dizziness   . Fibromyalgia   . Breast cancer   . Multiple myeloma   . Mucositis   . Hx of echocardiogram     a.  Echocardiogram (12/26/2012): EF 01-09%, grade 1 diastolic dysfunction;   b.  Echocardiogram (02/2013): EF 55-60%, no WMA, trivial effusion  . Fibromyalgia   . Arthritis   . Hx of cardiovascular stress test     LexiScan with low level exercise Myoview (02/2013): No ischemia, EF 72%; normal study  . Complication of anesthesia   . PONV (postoperative nausea and vomiting) 2008    after mastestomy  . Myocardial infarction     in past, patient was unaware.   . Anginal pain     used NTG x 2 May 31 and 06/15/13   . GERD (gastroesophageal reflux disease)   . Headache(784.0)   . Anxiety   . Depression   . CKD (chronic kidney disease) stage 3, GFR 30-59 ml/min   . Anemia   . History of blood transfusion     last one May 12   . Neuropathy     ALLERGIES:  is allergic to codeine; latex; iodinated diagnostic agents; other; sulfa antibiotics; and adhesive.  MEDICATIONS:  Current Outpatient Prescriptions  Medication Sig Dispense Refill  . acetaminophen (TYLENOL) 325 MG tablet Take 650 mg by mouth every 6 (six) hours as needed for mild pain or moderate pain.       Marland Kitchen albuterol (PROVENTIL HFA;VENTOLIN HFA) 108 (90 BASE) MCG/ACT inhaler Inhale 2 puffs  into the lungs 2 (two) times daily as needed for wheezing or shortness of breath.  1 each  6  . ascorbic acid (VITAMIN C) 250 MG CHEW Chew 250 mg by mouth daily.      Marland Kitchen aspirin EC 81 MG tablet Take 1 tablet (81 mg total) by mouth daily.      Marland Kitchen buPROPion (WELLBUTRIN XL) 150 MG 24 hr tablet Take 1 tablet (150 mg total) by mouth every morning. For mood and depression  30 tablet  99  . cetirizine (ZYRTEC) 10 MG tablet Take 10 mg by mouth at bedtime.       . cholecalciferol (VITAMIN D) 1000 UNITS tablet Take 4,000 Units by  mouth daily.      . citalopram (CELEXA) 40 MG tablet Take 40 mg by mouth at bedtime.      . cromolyn (OPTICROM) 4 % ophthalmic solution Place 1 drop into both eyes 4 (four) times daily.      . cyanocobalamin 2000 MCG tablet Take 2,000 mcg by mouth daily.       Marland Kitchen esomeprazole (NEXIUM) 40 MG capsule Take 1 capsule (40 mg total) by mouth daily. Each morning for acid indigestion  30 capsule  99  . Ferrous Sulfate 134 MG TABS Take 1 tablet by mouth daily.       . fluticasone (FLONASE) 50 MCG/ACT nasal spray Place 2 sprays into both nostrils at bedtime.       . gabapentin (NEURONTIN) 300 MG capsule Take 1 capsule (300 mg total) by mouth at bedtime.  90 capsule  3  . isosorbide mononitrate (IMDUR) 30 MG 24 hr tablet Take 30 mg by mouth daily. Patient taking 15 mg, had a sever head ache with 34m.      .Marland Kitchenlevothyroxine (SYNTHROID, LEVOTHROID) 150 MCG tablet TAKE 1 TABLET BY MOUTH ONCE DAILY.  1 1/2 tablet on saturday and sunday      . levothyroxine (SYNTHROID, LEVOTHROID) 150 MCG tablet Take 150-225 mcg by mouth daily before breakfast. 1 tablet all days except 1 and 1/2 tablets on Saturday and Sunday      . loteprednol (LOTEMAX) 0.2 % SUSP Place 1 drop into both eyes 3 (three) times daily.       . ondansetron (ZOFRAN-ODT) 8 MG disintegrating tablet Take 1 tablet (8 mg total) by mouth every 8 (eight) hours as needed for nausea or vomiting.  20 tablet  3  . Umeclidinium-Vilanterol 62.5-25 MCG/INH AEPB Inhale 1 puff into the lungs daily. Use daily      . zolpidem (AMBIEN) 5 MG tablet Take 5 mg by mouth at bedtime as needed for sleep.      . nitroGLYCERIN (NITROSTAT) 0.4 MG SL tablet Place 1 tablet (0.4 mg total) under the tongue every 5 (five) minutes as needed for chest pain.  25 tablet  3  . prochlorperazine (COMPAZINE) 10 MG tablet Take 1 tablet (10 mg total) by mouth every 6 (six) hours as needed for nausea or vomiting.  60 tablet  0  . ranitidine (ZANTAC) 300 MG tablet Take 1 tablet (300 mg total) by mouth  at bedtime. For acid reflux and indigestion  30 tablet  99  . sucralfate (CARAFATE) 1 G tablet Take 1 tablet (1 g total) by mouth 4 (four) times daily. Dissolve in water and swallow  120 tablet  99   No current facility-administered medications for this visit.   Facility-Administered Medications Ordered in Other Visits  Medication Dose Route Frequency Rayner Erman Last Rate Last Dose  .  0.9 %  sodium chloride infusion   Intravenous Once Adrena E Johnson, PA-C      . sodium chloride 0.9 % injection 10 mL  10 mL Intracatheter PRN Curt Bears, MD   10 mL at 01/05/13 1825  . sodium chloride 0.9 % injection 10 mL  10 mL Intracatheter PRN Adrena E Johnson, PA-C      . sodium chloride 0.9 % injection 10 mL  10 mL Intravenous PRN Curt Bears, MD        SURGICAL HISTORY:  Past Surgical History  Procedure Laterality Date  . History of port removal    . Status post stem cell transplant on September 28, 2008.    Marland Kitchen Abdominal hysterectomy  1981  . Cholecystectomy  1971  . Mastectomy Left 2008  . Cataract extraction, bilateral    . Breast surgery    . Eye surgery Bilateral     lens implant  . Breast reconstruction    . Portacath placement  12/2012    has had 2  . Av fistula placement Left 06/19/2013    Procedure: CREATION OF LEFT ARM ARTERIOVENOUS (AV) FISTULA ;  Surgeon: Angelia Mould, MD;  Location: [facility] OR;  Service: Vascular;  Laterality: Left;    REVIEW OF SYSTEMS:  Constitutional: positive for fatigue Eyes: negative Ears, nose, mouth, throat, and face: negative Respiratory: negative Cardiovascular: negative Gastrointestinal: positive for nausea Genitourinary:negative Integument/breast: negative Hematologic/lymphatic: negative Musculoskeletal:negative Neurological: negative Behavioral/Psych: negative Endocrine: negative Allergic/Immunologic: negative   PHYSICAL EXAMINATION: General appearance: alert, cooperative and no distress Head: Normocephalic, without obvious  abnormality, atraumatic Neck: no adenopathy Lymph nodes: Cervical, supraclavicular, and axillary nodes normal. Resp: clear to auscultation bilaterally Back: symmetric, no curvature. ROM normal. No CVA tenderness. Cardio: regular rate and rhythm, S1, S2 normal, no murmur, click, rub or gallop GI: soft, non-tender; bowel sounds normal; no masses,  no organomegaly Extremities: extremities normal, atraumatic, no cyanosis or edema Neurologic: Alert and oriented X 3, normal strength and tone. Normal symmetric reflexes. Normal coordination and gait  ECOG PERFORMANCE STATUS: 1 - Symptomatic but completely ambulatory  Blood pressure 132/66, pulse 75, temperature 98.5 F (36.9 C), temperature source Oral, resp. rate 18, height 5' 5" (1.651 m), weight 185 lb 3.2 oz (84.006 kg).  LABORATORY DATA: Lab Results  Component Value Date   WBC 8.2 07/07/2013   HGB 10.6* 07/07/2013   HCT 32.3* 07/07/2013   MCV 105.6* 07/07/2013   PLT 226 07/07/2013      Chemistry      Component Value Date/Time   NA 139 07/07/2013 1056   NA 142 06/19/2013 0617   K 3.2* 07/07/2013 1056   K 3.3* 06/19/2013 0617   CL 109 05/15/2013 1202   CL 105 03/19/2012 0811   CO2 22 07/07/2013 1056   CO2 23 05/15/2013 1202   BUN 21.1 07/07/2013 1056   BUN 36* 05/15/2013 1202   CREATININE 2.9* 07/07/2013 1056   CREATININE 3.45* 05/15/2013 1202   CREATININE 2.56* 01/14/2013 0625      Component Value Date/Time   CALCIUM 9.0 07/07/2013 1056   CALCIUM 9.4 05/15/2013 1202   ALKPHOS 124 07/07/2013 1056   ALKPHOS 130* 05/15/2013 1202   AST 19 07/07/2013 1056   AST 19 05/15/2013 1202   ALT 17 07/07/2013 1056   ALT 22 05/15/2013 1202   BILITOT 0.40 07/07/2013 1056   BILITOT 0.8 05/15/2013 1202     Other lab results: Beta-2 microglobulin 10.50, free kappa light chain is down to 2.01, free lambda  light chain 0.98, kappa/lambda ratio 2.05. Ig 496 gA 16nd IgM 20   RADIOGRAPHIC STUDIES:  ASSESSMENT AND PLAN: This is a very pleasant 73 years old white female with  relapsed multiple myeloma. She is currently undergoing systemic chemotherapy with Carfilzomib, Cytoxan and dexamethasone status post 4 cycles. Her myeloma panel is stable compared to 2 months ago. I discussed the lab result with the patient today and recommended for her to continue on observation with repeat myeloma panel in 3 months. For hypokalemia, the patient was advised to continue on potassium supplements.  She was advised to call immediately if she has any concerning symptoms in the interval.  Disclaimer: This note was dictated with voice recognition software. Similar sounding words can inadvertently be transcribed and may not be corrected upon review. [name]., MD 07/14/2013

## 2013-07-14 NOTE — Telephone Encounter (Signed)
gv adn rpinted appts ched and avs fo rpt for Sept..

## 2013-07-22 ENCOUNTER — Other Ambulatory Visit: Payer: Self-pay | Admitting: Emergency Medicine

## 2013-07-28 ENCOUNTER — Encounter: Payer: Self-pay | Admitting: Vascular Surgery

## 2013-07-29 ENCOUNTER — Ambulatory Visit (HOSPITAL_COMMUNITY)
Admission: RE | Admit: 2013-07-29 | Discharge: 2013-07-29 | Disposition: A | Discharge status: Home / Self Care | Payer: Medicare Other | Source: Ambulatory Visit | Attending: Vascular Surgery | Admitting: Vascular Surgery

## 2013-07-29 ENCOUNTER — Encounter: Payer: Self-pay | Admitting: Vascular Surgery

## 2013-07-29 ENCOUNTER — Ambulatory Visit (INDEPENDENT_AMBULATORY_CARE_PROVIDER_SITE_OTHER): Payer: Self-pay | Admitting: Vascular Surgery

## 2013-07-29 VITALS — BP 109/59 | HR 71 | Resp 16 | Ht 65.5 in | Wt 184.0 lb

## 2013-07-29 DIAGNOSIS — Z48812 Encounter for surgical aftercare following surgery on the circulatory system: Secondary | ICD-10-CM

## 2013-07-29 DIAGNOSIS — N184 Chronic kidney disease, stage 4 (severe): Secondary | ICD-10-CM

## 2013-07-29 DIAGNOSIS — N186 End stage renal disease: Secondary | ICD-10-CM

## 2013-07-29 NOTE — Progress Notes (Signed)
   Patient name: Nancy Norris MRN: [id number] DOB: 09/09/1940 Sex: female  REASON FOR VISIT: follow up of left brachiocephalic AV fistula  HPI: Nancy Norris is a 73 y.o. female who had a left brachiocephalic AV fistula placed on 06/19/2013. She comes in for a 6 week follow up visit. She has no specific complaints. She has not yet on dialysis.  REVIEW OF SYSTEMS: Valu.Nieves ] denotes positive finding; [  ] denotes negative finding  CARDIOVASCULAR:  [ ]  chest pain   [ ]  dyspnea on exertion    CONSTITUTIONAL:  [ ]  fever   [ ]  chills  PHYSICAL EXAM: Filed Vitals:   07/29/13 0931  BP: 109/59  Pulse: 71  Resp: 16  Height: 5' 5.5" (1.664 m)  Weight: 184 lb (83.462 kg)   Body mass index is 30.14 kg/(m^2). GENERAL: The patient is a well-nourished female, in no acute distress. The vital signs are documented above. CARDIOVASCULAR: There is a regular rate and rhythm. PULMONARY: There is good air exchange bilaterally without wheezing or rales. Her left upper arm fistula has an excellent bruit and thrill. She has a palpable left radial pulse.  DUPLEX OF HER FISTULA: The diameter of her fistula ranges from 0.68-0.74 cm.  MEDICAL ISSUES:  Chronic kidney disease, stage IV (severe) Her left brachiocephalic fistula appears to be maturing adequately. I think that this will provide adequate access if and when it is needed. I will see her back as needed.   [facility] [place] Beeper: [phone]

## 2013-07-29 NOTE — Assessment & Plan Note (Signed)
Her left brachiocephalic fistula appears to be maturing adequately. I think that this will provide adequate access if and when it is needed. I will see her back as needed.

## 2013-09-29 ENCOUNTER — Other Ambulatory Visit: Payer: Self-pay | Admitting: Internal Medicine

## 2013-09-30 ENCOUNTER — Ambulatory Visit (INDEPENDENT_AMBULATORY_CARE_PROVIDER_SITE_OTHER): Payer: Medicare Other | Admitting: Internal Medicine

## 2013-09-30 ENCOUNTER — Encounter: Payer: Self-pay | Admitting: Internal Medicine

## 2013-09-30 VITALS — BP 120/64 | HR 76 | Temp 97.9°F | Resp 16 | Ht 65.0 in | Wt 179.0 lb

## 2013-09-30 DIAGNOSIS — Z79899 Other long term (current) drug therapy: Secondary | ICD-10-CM

## 2013-09-30 DIAGNOSIS — E782 Mixed hyperlipidemia: Secondary | ICD-10-CM

## 2013-09-30 DIAGNOSIS — E559 Vitamin D deficiency, unspecified: Secondary | ICD-10-CM

## 2013-09-30 DIAGNOSIS — N2581 Secondary hyperparathyroidism of renal origin: Secondary | ICD-10-CM

## 2013-09-30 DIAGNOSIS — R7402 Elevation of levels of lactic acid dehydrogenase (LDH): Secondary | ICD-10-CM

## 2013-09-30 DIAGNOSIS — R740 Nonspecific elevation of levels of transaminase and lactic acid dehydrogenase [LDH]: Secondary | ICD-10-CM

## 2013-09-30 DIAGNOSIS — R7309 Other abnormal glucose: Secondary | ICD-10-CM

## 2013-09-30 DIAGNOSIS — R7401 Elevation of levels of liver transaminase levels: Secondary | ICD-10-CM

## 2013-09-30 DIAGNOSIS — I10 Essential (primary) hypertension: Secondary | ICD-10-CM

## 2013-09-30 DIAGNOSIS — Z23 Encounter for immunization: Secondary | ICD-10-CM

## 2013-09-30 DIAGNOSIS — D649 Anemia, unspecified: Secondary | ICD-10-CM

## 2013-09-30 LAB — CBC WITH DIFFERENTIAL/PLATELET
BASOS ABS: 0 10*3/uL (ref 0.0–0.1)
BASOS PCT: 0 % (ref 0–1)
Eosinophils Absolute: 0.2 10*3/uL (ref 0.0–0.7)
Eosinophils Relative: 3 % (ref 0–5)
HEMATOCRIT: 34.4 % — AB (ref 36.0–46.0)
Hemoglobin: 11.2 g/dL — ABNORMAL LOW (ref 12.0–15.0)
LYMPHS PCT: 22 % (ref 12–46)
Lymphs Abs: 1.5 10*3/uL (ref 0.7–4.0)
MCH: 32.7 pg (ref 26.0–34.0)
MCHC: 32.6 g/dL (ref 30.0–36.0)
MCV: 100.6 fL — ABNORMAL HIGH (ref 78.0–100.0)
MONO ABS: 0.7 10*3/uL (ref 0.1–1.0)
Monocytes Relative: 10 % (ref 3–12)
NEUTROS ABS: 4.6 10*3/uL (ref 1.7–7.7)
NEUTROS PCT: 65 % (ref 43–77)
Platelets: 234 10*3/uL (ref 150–400)
RBC: 3.42 MIL/uL — ABNORMAL LOW (ref 3.87–5.11)
RDW: 13.7 % (ref 11.5–15.5)
WBC: 7 10*3/uL (ref 4.0–10.5)

## 2013-09-30 MED ORDER — AZITHROMYCIN 250 MG PO TABS: ORAL_TABLET | ORAL | Status: DC

## 2013-09-30 NOTE — Progress Notes (Signed)
Patient ID: Nancy Norris, female   DOB: 1940/12/26, 73 y.o.   MRN: [id number]   This very nice 73 y.o.DWF presents for 3 month follow up with Hypertension, Hyperlipidemia, Pre-Diabetes and Vitamin D Deficiency.  Patient also has Stage 4 CKD and has a maturing ASVF in her left arm anticipating imminent initiating of Renal Dialysis.   Patient is treated for HTN & BP has been controlled at home. Today's BP: 120/64 mmHg. Patient denies any cardiac type chest pain, palpitations, dyspnea/orthopnea/PND, dizziness, claudication, or dependent edema.   Hyperlipidemia is controlled with diet & meds. Patient denies myalgias or other med SE's. Last Lipids were not at goal    Total Cholesterol208; HDL Chol 48; LDL 131; Trig 147 on 02/24/2013.   Also, the patient has history of PreDiabetes and patient denies any symptoms of reactive hypoglycemia, diabetic polys, paresthesias or visual blurring.  Last A1c was  4.9% on 02/24/2013.    Further, Patient has history of Vitamin D Deficiency and patient supplements vitamin D without any suspected side-effects. Last vitamin D was  62 on 02/24/2013.   Medication List   acetaminophen 325 MG tablet  Commonly known as:  TYLENOL  Take 650 mg by mouth every 6 (six) hours as needed for mild pain or moderate pain.     albuterol 108 (90 BASE) MCG/ACT inhaler  Commonly known as:  PROVENTIL HFA;VENTOLIN HFA  Inhale 2 puffs into the lungs 2 (two) times daily as needed for wheezing or shortness of breath.     ascorbic acid 250 MG Chew  Commonly known as:  VITAMIN C  Chew 250 mg by mouth daily.     aspirin EC 81 MG tablet  Take 1 tablet (81 mg total) by mouth daily.     buPROPion 150 MG 24 hr tablet  Commonly known as:  WELLBUTRIN XL  Take 1 tablet (150 mg total) by mouth every morning. For mood and depression     cetirizine 10 MG tablet  Commonly known as:  ZYRTEC  Take 10 mg by mouth at bedtime.     cholecalciferol 1000 UNITS tablet  Commonly known as:  VITAMIN D   Take 4,000 Units by mouth daily.     citalopram 40 MG tablet  Commonly known as:  CELEXA  Take 40 mg by mouth at bedtime.     citalopram 40 MG tablet  Commonly known as:  CELEXA  TAKE 1 TABLET BY MOUTH ONCE DAILY.     cromolyn 4 % ophthalmic solution  Commonly known as:  OPTICROM  Place 1 drop into both eyes 4 (four) times daily.     cyanocobalamin 2000 MCG tablet  Take 2,000 mcg by mouth daily.     esomeprazole 40 MG capsule  Commonly known as:  NEXIUM  Take 1 capsule (40 mg total) by mouth daily. Each morning for acid indigestion     Ferrous Sulfate 134 MG Tabs  Take 1 tablet by mouth daily.     fluticasone 50 MCG/ACT nasal spray  Commonly known as:  FLONASE  Place 2 sprays into both nostrils at bedtime.     gabapentin 300 MG capsule  Commonly known as:  NEURONTIN  Take 1 capsule (300 mg total) by mouth at bedtime.     isosorbide mononitrate 30 MG 24 hr tablet  Commonly known as:  IMDUR  Take 30 mg by mouth daily. Patient taking 15 mg, had a sever head ache with $RemoveB'30mg'JpwyePfb$ .     levothyroxine 150 MCG tablet  Commonly  known as:  SYNTHROID, LEVOTHROID  Take 150-225 mcg by mouth daily before breakfast. 1 tablet all days except 1 and 1/2 tablets on Saturday and Sunday     levothyroxine 150 MCG tablet  Commonly known as:  SYNTHROID, LEVOTHROID  TAKE 1 TABLET BY MOUTH ONCE DAILY.  1 1/2 tablet on saturday and sunday     levothyroxine 150 MCG tablet  Commonly known as:  SYNTHROID, LEVOTHROID  TAKE 1 TABLET BY MOUTH ONCE DAILY.     loteprednol 0.2 % Susp  Commonly known as:  LOTEMAX  Place 1 drop into both eyes 3 (three) times daily.     nitroGLYCERIN 0.4 MG SL tablet  Commonly known as:  NITROSTAT  Place 1 tablet (0.4 mg total) under the tongue every 5 (five) minutes as needed for chest pain.     ondansetron 8 MG disintegrating tablet  Commonly known as:  ZOFRAN-ODT  Take 1 tablet (8 mg total) by mouth every 8 (eight) hours as needed for nausea or vomiting.      prochlorperazine 10 MG tablet  Commonly known as:  COMPAZINE  TAKE 1 TABLET BY MOUTH EVERY 6 HOURS AS NEEDED FOR NAUSEA OR VOMITING.     ranitidine 300 MG tablet  Commonly known as:  ZANTAC  Take 1 tablet (300 mg total) by mouth at bedtime. For acid reflux and indigestion     sucralfate 1 G tablet  Commonly known as:  CARAFATE  Take 1 tablet (1 g total) by mouth 4 (four) times daily. Dissolve in water and swallow     Umeclidinium-Vilanterol 62.5-25 MCG/INH Aepb  Inhale 1 puff into the lungs daily. Use daily     zolpidem 5 MG tablet  Commonly known as:  AMBIEN  Take 5 mg by mouth at bedtime as needed for sleep.     Allergies  Allergen Reactions  . Codeine Anaphylaxis  . Latex Shortness Of Breath    Adhesive products   . Iodinated Diagnostic Agents Itching    Happened 40 years ago  . Other     Onion, chocolate causes headache  . Sulfa Antibiotics Itching  . Adhesive [Tape]     blisters   PMHx:   Past Medical History  Diagnosis Date  . Hyperkalemia   . Hypothyroidism   . COPD (chronic obstructive pulmonary disease)   . Hyponatremia   . Dizziness   . Fibromyalgia   . Breast cancer   . Multiple myeloma   . Mucositis   . Hx of echocardiogram     a.  Echocardiogram (12/26/2012): EF 26-94%, grade 1 diastolic dysfunction;   b.  Echocardiogram (02/2013): EF 55-60%, no WMA, trivial effusion  . Fibromyalgia   . Arthritis   . Hx of cardiovascular stress test     LexiScan with low level exercise Myoview (02/2013): No ischemia, EF 72%; normal study  . Complication of anesthesia   . PONV (postoperative nausea and vomiting) 2008    after mastestomy  . Myocardial infarction     in past, patient was unaware.   . Anginal pain     used NTG x 2 May 31 and 06/15/13   . GERD (gastroesophageal reflux disease)   . Headache(784.0)   . Anxiety   . Depression   . CKD (chronic kidney disease) stage 3, GFR 30-59 ml/min   . Anemia   . History of blood transfusion     last one May 12   .  Neuropathy    FHx:    Reviewed /  unchanged SHx:    Reviewed / unchanged  Systems Review:  Constitutional: Denies fever, chills, wt changes, headaches, insomnia, fatigue, night sweats, change in appetite. Eyes: Denies redness, blurred vision, diplopia, discharge, itchy, watery eyes.  ENT: Denies discharge, congestion, post nasal drip, epistaxis, sore throat, earache, hearing loss, dental pain, tinnitus, vertigo, sinus pain, snoring.  CV: Denies chest pain, palpitations, irregular heartbeat, syncope, dyspnea, diaphoresis, orthopnea, PND, claudication or edema. Respiratory: denies cough, dyspnea, DOE, pleurisy, hoarseness, laryngitis, wheezing.  Gastrointestinal: Denies dysphagia, odynophagia, heartburn, reflux, water brash, abdominal pain or cramps, nausea, vomiting, bloating, diarrhea, constipation, hematemesis, melena, hematochezia  or hemorrhoids. Genitourinary: Denies dysuria, frequency, urgency, nocturia, hesitancy, discharge, hematuria or flank pain. Musculoskeletal: Denies arthralgias, myalgias, stiffness, jt. swelling, pain, limping or strain/sprain.  Skin: Denies pruritus, rash, hives, warts, acne, eczema or change in skin lesion(s). Neuro: No weakness, tremor, incoordination, spasms, paresthesia or pain. Psychiatric: Denies confusion, memory loss or sensory loss. Endo: Denies change in weight, skin or hair change.  Heme/Lymph: No excessive bleeding, bruising or enlarged lymph nodes.  Exam:  BP 120/64  Pulse 76  Temp(Src) 97.9 F (36.6 C) (Temporal)  Resp 16  Ht $R'5\' 5"'mt$  (1.651 m)  Wt 179 lb (81.194 kg)  BMI 29.79 kg/m2  Appears well nourished and in no distress. Eyes: PERRLA, EOMs, conjunctiva no swelling or erythema. Sinuses: No frontal/maxillary tenderness ENT/Mouth: EAC's clear, TM's nl w/o erythema, bulging. Nares clear w/o erythema, swelling, exudates. Oropharynx clear without erythema or exudates. Oral hygiene is good. Tongue normal, non obstructing. Hearing intact.   Neck: Supple. Thyroid nl. Car 2+/2+ without bruits, nodes or JVD. Chest: Respirations nl with BS clear & equal w/o rales, rhonchi, wheezing or stridor.  Cor: Heart sounds normal w/ regular rate and rhythm without sig. murmurs, gallops, clicks, or rubs. Peripheral pulses normal and equal  without edema.  Abdomen: Soft & bowel sounds normal. Non-tender w/o guarding, rebound, hernias, masses, or organomegaly.  Lymphatics: Unremarkable.  Musculoskeletal: Full ROM all peripheral extremities, joint stability, 5/5 strength, and normal gait.  Skin: Warm, dry without exposed rashes, lesions or ecchymosis apparent.  Neuro: Cranial nerves intact, reflexes equal bilaterally. Sensory-motor testing grossly intact. Tendon reflexes grossly intact.  Pysch: Alert & oriented x 3.  Insight and judgement nl & appropriate. No ideations.  Assessment and Plan:  1. Hypertension - Continue monitor blood pressure at home. Continue diet/meds same.  2. Hyperlipidemia - Continue diet/meds, exercise,& lifestyle modifications. Continue monitor periodic cholesterol/liver & renal functions   3. Pre-Diabetes - Continue diet, exercise, lifestyle modifications. Monitor appropriate labs.  4. Vitamin D Deficiency - Continue supplementation.  5. Multiple Myeloma(2000) s/p auto Stem cell Graft(2010)  6. Breast Cancer (Lt) (2008)  Recommended regular exercise, BP monitoring, weight control, and discussed med and SE's. Recommended labs to assess and monitor clinical status. Further disposition pending results of labs.  ++++++++++++++++++++++++++++++++++++ Patient is also here for a  MEDICARE ANNUAL WELLNESS VISIT   Assessment:   1. Essential hypertension  - TSH - Microalbumin / creatinine urine ratio  2. Mixed hyperlipidemia  - Lipid panel  3. Other abnormal glucose  - Hemoglobin A1c - Insulin, fasting  4. Vitamin D Deficiency  - Vit D  25 hydroxy (rtn osteoporosis monitoring)  5. Encounter for long-term  (current) use of other medications  - CBC with Differential - BASIC METABOLIC PANEL WITH GFR - Hepatic function panel - Magnesium - Phosphorus - Urine Microscopic  6. Nonspecific elevation of levels of transaminase or lactic acid dehydrogenase (LDH)  - Hepatitis  A antibody, total - Hepatitis B core antibody, total - Hepatitis B e antibody - Hepatitis B surface antibody - Hepatitis C antibody  7. Anemia, unspecified  - Iron and TIBC - Ferritin  8. Hyperparathyroidism, secondary  - Parathyroid Hormone, Intact w/Ca  9. Need for prophylactic vaccination and inoculation against influenza  - Flu vaccine HIGH DOSE PF (Fluzone Tri High dose)  Plan:   During the course of the visit the patient was educated and counseled about appropriate screening and preventive services including:    Pneumococcal vaccine   Influenza vaccine  Td vaccine  Screening electrocardiogram  Screening mammography  Bone densitometry screening  Colorectal cancer screening  Diabetes screening  Glaucoma screening  Nutrition counseling   Advanced directives: given information/requested  Screening recommendations, referrals:  Vaccinations: Tdap vaccine not indicated Influenza vaccine ordered Pneumococcal vaccine not indicated Shingles vaccine not indicated Hep B vaccine not indicated  Nutrition assessed and recommended  Colonoscopy not indicated Mammogram not indicated Pap smear not indicated Pelvic exam not indicated Recommended yearly ophthalmology/optometry visit for glaucoma screening and checkup Recommended yearly dental visit for hygiene and checkup Advanced directives - not indicated  Conditions/risks identified: BMI: Discussed weight loss, diet, and increase physical activity.  Increase physical activity: AHA recommends 150 minutes of physical activity a week.  Medications reviewed DEXA- declined PreDiabetes at goal. Urinary Incontinence is not an issue: discussed non  pharmacology and pharmacology options.  Fall risk: low- discussed PT, home fall assessment, medications.   Subjective:   Nancy Norris is a 73 y.o. female who presents for Medicare Annual Wellness Visit and complete physical.    Date of last medicare wellness visit is unknown.  She has had labile low blood pressure for several years. Her blood pressure has been controlled at home, today their BP is BP: 120/64 mmHg She does not workout. She denies chest pain, shortness of breath, dizziness.  She is not on cholesterol medication and denies myalgias. Her cholesterol is not at goal. The cholesterol last visit was:   Lab Results  Component Value Date   CHOL 208* 02/24/2013   HDL 48 02/24/2013   LDLCALC 131* 02/24/2013   TRIG 147 02/24/2013   CHOLHDL 4.3 02/24/2013   She has had Prediabetes for 2 years (Mar 2013). She has not been working on diet and exercise for  prediabetes, and denies foot ulcerations, hyperglycemia, hypoglycemia , nausea, paresthesia of the feet, polydipsia, polyuria and visual disturbances. Last A1C in the office was:  Lab Results  Component Value Date   HGBA1C 4.9 02/24/2013   Patient is on Vitamin D supplement.   Lab Results  Component Value Date   VD25OH 62 02/24/2013     Names of Other Physician/Practitioners you currently use: 1. [facility]- here for primary care 2. Dr Delight Stare, eye doctor, last visit 02/2013 3. Dr Foye Clock, dentist, last visit 10/2012  Patient Care Team: [name], MD as PCP - General (Internal Medicine) [name], MD (Internal Medicine) Donetta Potts, MD as Referring Physician (Nephrology) Curt Bears, MD as Consulting Physician (Oncology) Wonda Horner, MD as Consulting Physician (Gastroenterology) Fabio Pierce, MD as Consulting Physician (Ophthalmology)  Medication Review Medication Sig  . TYLENOL 325 MG tablet Take 2 tabs every 6 (six) hours as needed  . albuterol  HFA  inhaler Inhale 2 puffs 2 (two) times daily as needed  . VITAMIN C 250 MG  Chew 250 mg by mouth daily.  Marland Kitchen aspirin EC 81  MG tablet Take 1 tablet (81 mg total) by mouth daily.  Marland Kitchen buPROPion XL 150 MG Take 1 tablet  every morning.  . cetirizine  10 MG tablet Take 10 mg by mouth at bedtime.   Marland Kitchen VITAMIN D 1000 UNITS tablet Take 4,000 Units by mouth daily.  . citalopram  40 MG tablet TAKE 1 TAB DAILY.  Marland Kitchen OPTICROM)4 % ophth  1 drop into both eyes 4 (four) times daily.  . cyanocobalamin 2000 MCG tablet Take 2,000 mcg by mouth daily.   Marland Kitchen esomeprazole  40 MG capsule Take 1 capsule (40 mg total) by mouth daily.   . Ferrous Sulfate 134 MG TABS Take 1 tab daily.   Marland Kitchen FLONASEnasal spray Place 2 sprays into nostrils at bedtime.   . gabapentin 300 MG capsule Take 1 capsule at bedtime.  . IMDUR 30 MG 24 hr tablet Take  daily. Patient taking 15 mg, had a sever head ache with $RemoveB'30mg'lUsmjxlC$ .  . levothyroxine 150 MCG tablet TAKE 1 TAB DAILY.  1 1/2 tablet on saturday and sunday  . loteprednol (LOTEMAX) 0.2 % SUSP Place 1 drop  both eyes 3  times daily.   Marland Kitchen NITROSTAT 0.4 MG SL tab Place 1 tablet under  tongue  as needed  . ondansetron (ZOFRAN-ODT) 8 MG  Take 1 tab every 8  hours as needed for nausea  . prochlorperazine 10 MG tab TAKE 1 TAB EVERY 6 HOURS AS NEEDED  . ranitidine  300 MG tablet Take 1 tablet (300 mg total) by mouth at bedtime. For acid reflux and indigestion  . sucralfate  1 G tablet Take 1 tab 4  times daily  . Umeclidinium-Vilanterol 62.5-25  Inhale 1 puff into the lungs daily.  Marland Kitchen zolpidem  5 MG tablet Take  at bedtime as needed for sleep.   Current Problems (verified) Patient Active Problem List   Diagnosis Date Noted  . Encounter for long-term (current) use of other medications 09/30/2013  . Chronic kidney disease, stage IV (severe) 06/10/2013  . Vitamin D Deficiency 03/24/2013  . Mixed hyperlipidemia 02/24/2013  . Other abnormal glucose 02/24/2013  . Fibromyalgia   . COPD (chronic obstructive  pulmonary disease) with emphysema 02/20/2013  . CKD (chronic kidney disease) stage 3, GFR 30-59 ml/min 01/29/2013  . GERD (gastroesophageal reflux disease) 01/20/2013  . HTN (hypertension) 01/14/2013  . Anemia 01/13/2013  . Sleep disturbance 01/13/2013  . Asthma, chronic 01/13/2013  . Hypothyroid 01/13/2013  . Chronic diastolic heart failure 78/29/5621  . Multiple myeloma, in relapse 12/25/2012  . HX: breast cancer 12/08/2012  . Acute on chronic renal failure 12/02/2012  . Hypercalcemia 12/02/2012  . Multiple myeloma in remission 09/26/2011   Screening Tests Health Maintenance  Topic Date Due  . Colonoscopy  09/23/1991  . Zostavax  09/22/2001  . Influenza Vaccine  08/15/2013  . Mammogram  03/24/2015  . Tetanus/tdap  10/15/2019  . Pneumococcal Polysaccharide Vaccine Age 47 And Over  Completed    Immunization History  Administered Date(s) Administered  . Influenza, High Dose Seasonal PF 09/30/2013  . Influenza-Unspecified 11/05/2012  . Pneumococcal-Unspecified 03/16/2011  . Tdap 10/14/2009   Preventative care: Last colonoscopy: 2011 Last mammogram: 3/15 Last pap smear/pelvic exam: ? 2011  DEXA:?  Prior vaccinations: TD or Tdap: 09/2009  Influenza: 09/2013 Pneumococcal: 03/2011 Shingles/Zostavax: no  History reviewed: allergies, current medications, past family history, past medical history, past social history, past surgical history and problem list  Risk Factors: Osteoporosis: postmenopausal estrogen deficiency, glucocorticoid use, hyperparathyroidism, amenorrhea and hypogonadism  History of fracture in the past year: no  Tobacco History  Substance Use Topics  . Smoking status: Former Smoker -- 1.00 packs/day for 30 years    Types: Cigarettes    Quit date: 02/15/2005  . Smokeless tobacco: Never Used  . Alcohol Use: No   She does not smoke.  Patient is a former smoker. Are there smokers in your home (other than you)?  No  Alcohol Current alcohol use:  none  Caffeine Current caffeine use: coffee 1 /day  Exercise  Current exercise: none  Nutrition/Diet Current diet: in general, an "unhealthy" diet  Cardiac risk factors: advanced age (older than 87 for men, 74 for women), dyslipidemia, hypertension, obesity (BMI >= 30 kg/m2) and sedentary lifestyle.  Depression Screen (Note: if answer to either of the following is "Yes", a more complete depression screening is indicated)   Q1: Over the past two weeks, have you felt down, depressed or hopeless? No  Q2: Over the past two weeks, have you felt little interest or pleasure in doing things? No  Have you lost interest or pleasure in daily life? No  Do you often feel hopeless? No  Do you cry easily over simple problems? No  Activities of Daily Living In your present state of health, do you have any difficulty performing the following activities?:  Driving? No Managing money?  No Feeding yourself? No Getting from bed to chair? No Climbing a flight of stairs? No Preparing food and eating?: No Bathing or showering? No Getting dressed: No Getting to the toilet? No Using the toilet:No Moving around from place to place: No In the past year have you fallen or had a near fall?:No   Are you sexually active?  No  Do you have more than one partner?  No  Vision Difficulties: No  Hearing Difficulties: No Do you often ask people to speak up or repeat themselves? No Do you experience ringing or noises in your ears? No Do you have difficulty understanding soft or whispered voices? No  Cognition  Do you feel that you have a problem with memory?No  Do you often misplace items? No  Do you feel safe at home?  Yes  Advanced directives Does patient have a [place]? Yes Does patient have a Living Will? Yes  Objective:     BP 120/64,    P 76,     T 97.9 F,     RR 16,     ht $R'5\' 5"'BW$ ,     wt 179 lb,       BMI  29.79   General appearance: alert, no distress, WD/WN,   female Cognitive Testing  Alert? Yes  Normal Appearance?Yes  Oriented to person? Yes  Place? Yes   Time? Yes  Recall of three objects?  Yes  Can perform simple calculations? Yes  Displays appropriate judgment?Yes  Can read the correct time from a watch face?Yes  HEENT: normocephalic, sclerae anicteric, TMs pearly, nares patent, no discharge or erythema, pharynx normal Oral cavity: MMM, no lesions Neck: supple, no lymphadenopathy, no thyromegaly, no masses Heart: RRR, normal S1, S2, no murmurs Lungs: CTA bilaterally, no wheezes, rhonchi, or rales Abdomen: +bs, soft, non tender, non distended, no masses, no hepatomegaly, no splenomegaly Musculoskeletal: nontender, no swelling, no obvious deformity Extremities: no edema, no cyanosis, no clubbing Pulses: 2+ symmetric, upper and lower extremities, normal cap refill Neurological: alert, oriented x 3, CN2-12 intact, strength normal upper extremities and lower extremities, sensation normal throughout, DTRs  2+ throughout, no cerebellar signs, gait normal Psychiatric: normal affect, behavior normal, pleasant  Breast: defer Gyn: defer  Rectal: defer  Medicare Attestation I have personally reviewed: The patient's medical and social history Their use of alcohol, tobacco or illicit drugs Their current medications and supplements The patient's functional ability including ADLs,fall risks, home safety risks, cognitive, and hearing and visual impairment Diet and physical activities Evidence for depression or mood disorders  The patient's weight, height, BMI, and visual acuity have been recorded in the chart.  I have made referrals, counseling, and provided education to the patient based on review of the above and I have provided the patient with a written personalized care plan for preventive services.    Zoa Dowty DAVID, MD   09/30/2013

## 2013-09-30 NOTE — Patient Instructions (Signed)

## 2013-10-01 LAB — URINALYSIS, MICROSCOPIC ONLY
CRYSTALS: NONE SEEN
Casts: NONE SEEN

## 2013-10-01 LAB — HEMOGLOBIN A1C
Hgb A1c MFr Bld: 5.8 % — ABNORMAL HIGH (ref <5.7)
MEAN PLASMA GLUCOSE: 120 mg/dL — AB (ref <117)

## 2013-10-01 LAB — PTH, INTACT AND CALCIUM
CALCIUM: 9.4 mg/dL (ref 8.4–10.5)
PTH: 19 pg/mL (ref 14–64)

## 2013-10-01 LAB — INSULIN, FASTING: Insulin fasting, serum: 12.4 u[IU]/mL (ref 2.0–19.6)

## 2013-10-01 LAB — PHOSPHORUS: Phosphorus: 4.2 mg/dL (ref 2.3–4.6)

## 2013-10-01 LAB — HEPATITIS C ANTIBODY: HCV Ab: NEGATIVE

## 2013-10-01 LAB — LIPID PANEL
CHOLESTEROL: 190 mg/dL (ref 0–200)
HDL: 43 mg/dL (ref >39)
LDL Cholesterol: 113 mg/dL — ABNORMAL HIGH (ref 0–99)
TRIGLYCERIDES: 169 mg/dL — AB (ref <150)
Total CHOL/HDL Ratio: 4.4 Ratio
VLDL: 34 mg/dL (ref 0–40)

## 2013-10-01 LAB — BASIC METABOLIC PANEL WITH GFR
BUN: 26 mg/dL — AB (ref 6–23)
CO2: 25 mEq/L (ref 19–32)
Calcium: 9.6 mg/dL (ref 8.4–10.5)
Chloride: 112 mEq/L (ref 96–112)
Creat: 3.77 mg/dL — ABNORMAL HIGH (ref 0.50–1.10)
GFR, EST AFRICAN AMERICAN: 13 mL/min — AB
GFR, EST NON AFRICAN AMERICAN: 11 mL/min — AB
Glucose, Bld: 108 mg/dL — ABNORMAL HIGH (ref 70–99)
POTASSIUM: 4.7 meq/L (ref 3.5–5.3)
Sodium: 147 mEq/L — ABNORMAL HIGH (ref 135–145)

## 2013-10-01 LAB — IRON AND TIBC
%SAT: 43 % (ref 20–55)
IRON: 98 ug/dL (ref 42–145)
TIBC: 228 ug/dL — AB (ref 250–470)
UIBC: 130 ug/dL (ref 125–400)

## 2013-10-01 LAB — MICROALBUMIN / CREATININE URINE RATIO
Creatinine, Urine: 90.3 mg/dL
Microalb Creat Ratio: 103.4 mg/g — ABNORMAL HIGH (ref 0.0–30.0)
Microalb, Ur: 9.34 mg/dL — ABNORMAL HIGH (ref 0.00–1.89)

## 2013-10-01 LAB — HEPATITIS B CORE ANTIBODY, TOTAL: Hep B Core Total Ab: NONREACTIVE

## 2013-10-01 LAB — HEPATIC FUNCTION PANEL
ALT: 19 U/L (ref 0–35)
AST: 18 U/L (ref 0–37)
Albumin: 4.1 g/dL (ref 3.5–5.2)
Alkaline Phosphatase: 101 U/L (ref 39–117)
BILIRUBIN DIRECT: 0.1 mg/dL (ref 0.0–0.3)
BILIRUBIN TOTAL: 0.6 mg/dL (ref 0.2–1.2)
Indirect Bilirubin: 0.5 mg/dL (ref 0.2–1.2)
Total Protein: 6 g/dL (ref 6.0–8.3)

## 2013-10-01 LAB — HEPATITIS A ANTIBODY, TOTAL: HEP A TOTAL AB: NONREACTIVE

## 2013-10-01 LAB — TSH: TSH: 0.514 u[IU]/mL (ref 0.350–4.500)

## 2013-10-01 LAB — VITAMIN D 25 HYDROXY (VIT D DEFICIENCY, FRACTURES): Vit D, 25-Hydroxy: 66 ng/mL (ref 30–89)

## 2013-10-01 LAB — MAGNESIUM: Magnesium: 2.3 mg/dL (ref 1.5–2.5)

## 2013-10-01 LAB — FERRITIN: Ferritin: 360 ng/mL — ABNORMAL HIGH (ref 10–291)

## 2013-10-01 LAB — HEPATITIS B SURFACE ANTIBODY,QUALITATIVE: Hep B S Ab: NEGATIVE

## 2013-10-02 LAB — HEPATITIS B E ANTIBODY: Hepatitis Be Antibody: NONREACTIVE

## 2013-10-05 ENCOUNTER — Ambulatory Visit (INDEPENDENT_AMBULATORY_CARE_PROVIDER_SITE_OTHER): Payer: Medicare Other | Admitting: *Deleted

## 2013-10-05 DIAGNOSIS — Z23 Encounter for immunization: Secondary | ICD-10-CM

## 2013-10-05 NOTE — Addendum Note (Signed)
Addended by: Eisa Necaise A on: 10/05/2013 10:41 AM   Modules accepted: Orders, Level of Service

## 2013-10-06 ENCOUNTER — Other Ambulatory Visit (HOSPITAL_BASED_OUTPATIENT_CLINIC_OR_DEPARTMENT_OTHER): Payer: Medicare Other

## 2013-10-06 ENCOUNTER — Telehealth: Payer: Self-pay | Admitting: *Deleted

## 2013-10-06 DIAGNOSIS — R5381 Other malaise: Secondary | ICD-10-CM

## 2013-10-06 DIAGNOSIS — C9002 Multiple myeloma in relapse: Secondary | ICD-10-CM

## 2013-10-06 DIAGNOSIS — R5383 Other fatigue: Secondary | ICD-10-CM

## 2013-10-06 LAB — URINALYSIS, MICROSCOPIC ONLY
Bacteria, UA: NONE SEEN
CASTS: NONE SEEN
CRYSTALS: NONE SEEN
Squamous Epithelial / LPF: NONE SEEN

## 2013-10-06 LAB — COMPREHENSIVE METABOLIC PANEL (CC13)
ALK PHOS: 118 U/L (ref 40–150)
ALT: 13 U/L (ref 0–55)
ANION GAP: 9 meq/L (ref 3–11)
AST: 20 U/L (ref 5–34)
Albumin: 3.7 g/dL (ref 3.5–5.0)
BUN: 29 mg/dL — ABNORMAL HIGH (ref 7.0–26.0)
CO2: 21 mEq/L — ABNORMAL LOW (ref 22–29)
Calcium: 9.4 mg/dL (ref 8.4–10.4)
Chloride: 108 mEq/L (ref 98–109)
Creatinine: 3.8 mg/dL (ref 0.6–1.1)
Glucose: 105 mg/dl (ref 70–140)
Potassium: 3.4 mEq/L — ABNORMAL LOW (ref 3.5–5.1)
SODIUM: 139 meq/L (ref 136–145)
TOTAL PROTEIN: 6.5 g/dL (ref 6.4–8.3)
Total Bilirubin: 0.48 mg/dL (ref 0.20–1.20)

## 2013-10-06 LAB — URINALYSIS, ROUTINE W REFLEX MICROSCOPIC
Bilirubin Urine: NEGATIVE
GLUCOSE, UA: 250 mg/dL — AB
Ketones, ur: NEGATIVE mg/dL
LEUKOCYTES UA: NEGATIVE
NITRITE: NEGATIVE
PH: 6.5 (ref 5.0–8.0)
Protein, ur: 100 mg/dL — AB
Specific Gravity, Urine: 1.013 (ref 1.005–1.030)
Urobilinogen, UA: 0.2 mg/dL (ref 0.0–1.0)

## 2013-10-06 LAB — CBC WITH DIFFERENTIAL/PLATELET
BASO%: 0.3 % (ref 0.0–2.0)
Basophils Absolute: 0 10*3/uL (ref 0.0–0.1)
EOS%: 4.3 % (ref 0.0–7.0)
Eosinophils Absolute: 0.3 10*3/uL (ref 0.0–0.5)
HEMATOCRIT: 32.8 % — AB (ref 34.8–46.6)
HGB: 10.7 g/dL — ABNORMAL LOW (ref 11.6–15.9)
LYMPH%: 19.8 % (ref 14.0–49.7)
MCH: 32.3 pg (ref 25.1–34.0)
MCHC: 32.6 g/dL (ref 31.5–36.0)
MCV: 99.1 fL (ref 79.5–101.0)
MONO#: 0.8 10*3/uL (ref 0.1–0.9)
MONO%: 12.1 % (ref 0.0–14.0)
NEUT#: 4.2 10*3/uL (ref 1.5–6.5)
NEUT%: 63.5 % (ref 38.4–76.8)
PLATELETS: 212 10*3/uL (ref 145–400)
RBC: 3.31 10*6/uL — AB (ref 3.70–5.45)
RDW: 13.4 % (ref 11.2–14.5)
WBC: 6.7 10*3/uL (ref 3.9–10.3)
lymph#: 1.3 10*3/uL (ref 0.9–3.3)

## 2013-10-06 LAB — URINE CULTURE: Colony Count: 50000

## 2013-10-06 LAB — LACTATE DEHYDROGENASE (CC13): LDH: 151 U/L (ref 125–245)

## 2013-10-06 NOTE — Telephone Encounter (Signed)
Cr 3.8, Dr Vista Mink aware, no further orders.

## 2013-10-07 ENCOUNTER — Telehealth: Payer: Self-pay | Admitting: *Deleted

## 2013-10-07 NOTE — Telephone Encounter (Signed)
Patient requested 09/2013 labs be sent to [facility] for upcoming appointment.  Faxed labs to [phone].

## 2013-10-08 LAB — IGG, IGA, IGM
IGA: 19 mg/dL — AB (ref 69–380)
IGM, SERUM: 19 mg/dL — AB (ref 52–322)
IgG (Immunoglobin G), Serum: 582 mg/dL — ABNORMAL LOW (ref 690–1700)

## 2013-10-08 LAB — BETA 2 MICROGLOBULIN, SERUM: Beta-2 Microglobulin: 10.3 mg/L — ABNORMAL HIGH (ref <=2.51)

## 2013-10-08 LAB — KAPPA/LAMBDA LIGHT CHAINS
KAPPA FREE LGHT CHN: 3.67 mg/dL — AB (ref 0.33–1.94)
KAPPA LAMBDA RATIO: 3.86 — AB (ref 0.26–1.65)
LAMBDA FREE LGHT CHN: 0.95 mg/dL (ref 0.57–2.63)

## 2013-10-13 ENCOUNTER — Ambulatory Visit (INDEPENDENT_AMBULATORY_CARE_PROVIDER_SITE_OTHER): Payer: Self-pay | Admitting: Surgery

## 2013-10-13 ENCOUNTER — Telehealth: Payer: Self-pay | Admitting: Internal Medicine

## 2013-10-13 ENCOUNTER — Ambulatory Visit (HOSPITAL_BASED_OUTPATIENT_CLINIC_OR_DEPARTMENT_OTHER): Payer: Medicare Other | Admitting: Internal Medicine

## 2013-10-13 ENCOUNTER — Encounter: Payer: Self-pay | Admitting: Internal Medicine

## 2013-10-13 VITALS — BP 116/59 | HR 78 | Temp 97.9°F | Resp 20 | Ht 65.0 in | Wt 176.5 lb

## 2013-10-13 DIAGNOSIS — N186 End stage renal disease: Secondary | ICD-10-CM

## 2013-10-13 DIAGNOSIS — C9001 Multiple myeloma in remission: Secondary | ICD-10-CM

## 2013-10-13 DIAGNOSIS — C9002 Multiple myeloma in relapse: Secondary | ICD-10-CM

## 2013-10-13 DIAGNOSIS — Z853 Personal history of malignant neoplasm of breast: Secondary | ICD-10-CM

## 2013-10-13 NOTE — Progress Notes (Signed)
[place]  [phone] [phone]  OFFICE PROGRESS NOTE   PRINCIPAL DIAGNOSES:  1. Recurrent multiple myeloma initially diagnosed in 2002, initially with smoldering myeloma at [facility].  2. Ductal carcinoma in situ status post mastectomy with sentinel lymph node biopsy in October 2008.  PRIOR THERAPY:  1. Status post treatment with tamoxifen from November 2008 through February 2009, discontinued secondary to intolerance. 2. Status post 3 cycles of chemotherapy with Revlimid and Decadron followed by 1 cycle of Decadron only with mild response. 3. Status post 2 cycles of systemic chemotherapy with Velcade, Doxil and Decadron discontinued secondary to significant peripheral neuropathy. Last dose was given May 2010 at [facility]. 4. Status post autologous peripheral blood stem cell transplant on October 01, 2008 at [facility] under the care of Dr. Phyllis Ginger.  5. Systemic chemotherapy with Carfilzomib initially was 20 mg/M2 and will be increased after cycle #1 to 36 mg/M2 on days 1, 2, 8, 9, 15 and 16 every 4 weeks in addition to Cytoxan 300 mg/M2 and Decadron 40 mg by mouth weekly basis, status post 4 cycles. First cycle on 12/29/2012.    CURRENT THERAPY: Observation.   INTERVAL HISTORY: Nancy Norris 73 y.o. female returns to the clinic today for followup visit. She is feeling fine today with no specific complaints. She is almost about to start hemodialysis and had the fistula performed earlier thinking about proceeding with peritoneal dialysis first. She denied having any significant nausea or vomiting, no fever or chills. The patient denied having any chest pain, shortness breath, cough or hemoptysis. No significant weight loss or night sweats. She had repeat myeloma panel performed recently and she is here for evaluation and discussion of her lab results.   MEDICAL HISTORY: Past Medical History  Diagnosis Date  .  Hyperkalemia   . Hypothyroidism   . COPD (chronic obstructive pulmonary disease)   . Hyponatremia   . Dizziness   . Fibromyalgia   . Breast cancer   . Multiple myeloma   . Mucositis   . Hx of echocardiogram     a.  Echocardiogram (12/26/2012): EF 96-75%, grade 1 diastolic dysfunction;   b.  Echocardiogram (02/2013): EF 55-60%, no WMA, trivial effusion  . Fibromyalgia   . Arthritis   . Hx of cardiovascular stress test     LexiScan with low level exercise Myoview (02/2013): No ischemia, EF 72%; normal study  . Complication of anesthesia   . PONV (postoperative nausea and vomiting) 2008    after mastestomy  . Myocardial infarction     in past, patient was unaware.   . Anginal pain     used NTG x 2 May 31 and 06/15/13   . GERD (gastroesophageal reflux disease)   . Headache(784.0)   . Anxiety   . Depression   . CKD (chronic kidney disease) stage 3, GFR 30-59 ml/min   . Anemia   . History of blood transfusion     last one May 12   . Neuropathy     ALLERGIES:  is allergic to codeine; latex; iodinated diagnostic agents; other; sulfa antibiotics; and adhesive.  MEDICATIONS:  Current Outpatient Prescriptions  Medication Sig Dispense Refill  . acetaminophen (TYLENOL) 325 MG tablet Take 650 mg by mouth every 6 (six) hours as needed for mild pain or moderate pain.       Marland Kitchen albuterol (PROVENTIL HFA;VENTOLIN HFA) 108 (90 BASE) MCG/ACT inhaler Inhale 2 puffs into the lungs 2 (two) times daily  as needed for wheezing or shortness of breath.  1 each  6  . ascorbic acid (VITAMIN C) 250 MG CHEW Chew 250 mg by mouth daily.      Marland Kitchen aspirin EC 81 MG tablet Take 1 tablet (81 mg total) by mouth daily.      Marland Kitchen azithromycin (ZITHROMAX) 250 MG tablet Take 2 tablets (500 mg) on  Day 1,  followed by 1 tablet (250 mg) once daily on Days 2 through 5.  6 each  1  . buPROPion (WELLBUTRIN XL) 150 MG 24 hr tablet Take 1 tablet (150 mg total) by mouth every morning. For mood and depression  30 tablet  99  .  cetirizine (ZYRTEC) 10 MG tablet Take 10 mg by mouth at bedtime.       . cholecalciferol (VITAMIN D) 1000 UNITS tablet Take 4,000 Units by mouth daily.      . citalopram (CELEXA) 40 MG tablet TAKE 1 TABLET BY MOUTH ONCE DAILY.  90 tablet  0  . cromolyn (OPTICROM) 4 % ophthalmic solution Place 1 drop into both eyes 4 (four) times daily.      . cyanocobalamin 2000 MCG tablet Take 2,000 mcg by mouth daily.       Marland Kitchen esomeprazole (NEXIUM) 40 MG capsule Take 1 capsule (40 mg total) by mouth daily. Each morning for acid indigestion  30 capsule  99  . Ferrous Sulfate 134 MG TABS Take 1 tablet by mouth daily.       . fluticasone (FLONASE) 50 MCG/ACT nasal spray Place 2 sprays into both nostrils at bedtime.       . gabapentin (NEURONTIN) 300 MG capsule Take 1 capsule (300 mg total) by mouth at bedtime.  90 capsule  3  . isosorbide mononitrate (IMDUR) 30 MG 24 hr tablet Take 30 mg by mouth daily. Patient taking 15 mg, had a sever head ache with $RemoveB'30mg'CqyTOkJw$ .      . levothyroxine (SYNTHROID, LEVOTHROID) 150 MCG tablet TAKE 1 TABLET BY MOUTH ONCE DAILY.  1 1/2 tablet on saturday and sunday      . loteprednol (LOTEMAX) 0.2 % SUSP Place 1 drop into both eyes 3 (three) times daily.       . nitroGLYCERIN (NITROSTAT) 0.4 MG SL tablet Place 1 tablet (0.4 mg total) under the tongue every 5 (five) minutes as needed for chest pain.  25 tablet  3  . ondansetron (ZOFRAN-ODT) 8 MG disintegrating tablet Take 1 tablet (8 mg total) by mouth every 8 (eight) hours as needed for nausea or vomiting.  20 tablet  3  . prochlorperazine (COMPAZINE) 10 MG tablet TAKE 1 TABLET BY MOUTH EVERY 6 HOURS AS NEEDED FOR NAUSEA OR VOMITING.  60 tablet  0  . ranitidine (ZANTAC) 300 MG tablet Take 1 tablet (300 mg total) by mouth at bedtime. For acid reflux and indigestion  30 tablet  99  . sucralfate (CARAFATE) 1 G tablet Take 1 tablet (1 g total) by mouth 4 (four) times daily. Dissolve in water and swallow  120 tablet  99  . Umeclidinium-Vilanterol  62.5-25 MCG/INH AEPB Inhale 1 puff into the lungs daily. Use daily      . zolpidem (AMBIEN) 5 MG tablet Take 5 mg by mouth at bedtime as needed for sleep.       No current facility-administered medications for this visit.   Facility-Administered Medications Ordered in Other Visits  Medication Dose Route Frequency Provider Last Rate Last Dose  . 0.9 %  sodium chloride infusion  Intravenous Once [employer], PA-C      . sodium chloride 0.9 % injection 10 mL  10 mL Intracatheter PRN Curt Bears, MD   10 mL at 01/05/13 1825  . sodium chloride 0.9 % injection 10 mL  10 mL Intracatheter PRN Adrena E Johnson, PA-C      . sodium chloride 0.9 % injection 10 mL  10 mL Intravenous PRN Curt Bears, MD        SURGICAL HISTORY:  Past Surgical History  Procedure Laterality Date  . History of port removal    . Status post stem cell transplant on September 28, 2008.    Marland Kitchen Abdominal hysterectomy  1981  . Cholecystectomy  1971  . Mastectomy Left 2008  . Cataract extraction, bilateral    . Breast surgery    . Eye surgery Bilateral     lens implant  . Breast reconstruction    . Portacath placement  12/2012    has had 2  . Av fistula placement Left 06/19/2013    Procedure: CREATION OF LEFT ARM ARTERIOVENOUS (AV) FISTULA ;  Surgeon: Angelia Mould, MD;  Location: [facility] OR;  Service: Vascular;  Laterality: Left;    REVIEW OF SYSTEMS:  Constitutional: positive for fatigue Eyes: negative Ears, nose, mouth, throat, and face: negative Respiratory: negative Cardiovascular: negative Gastrointestinal: positive for nausea Genitourinary:negative Integument/breast: negative Hematologic/lymphatic: negative Musculoskeletal:negative Neurological: negative Behavioral/Psych: negative Endocrine: negative Allergic/Immunologic: negative   PHYSICAL EXAMINATION: General appearance: alert, cooperative and no distress Head: Normocephalic, without obvious abnormality, atraumatic Neck: no  adenopathy Lymph nodes: Cervical, supraclavicular, and axillary nodes normal. Resp: clear to auscultation bilaterally Back: symmetric, no curvature. ROM normal. No CVA tenderness. Cardio: regular rate and rhythm, S1, S2 normal, no murmur, click, rub or gallop GI: soft, non-tender; bowel sounds normal; no masses,  no organomegaly Extremities: extremities normal, atraumatic, no cyanosis or edema Neurologic: Alert and oriented X 3, normal strength and tone. Normal symmetric reflexes. Normal coordination and gait  ECOG PERFORMANCE STATUS: 1 - Symptomatic but completely ambulatory  Blood pressure 116/59, pulse 78, temperature 97.9 F (36.6 C), temperature source Oral, resp. rate 20, height $RemoveBe'5\' 5"'OScpzEUIL$  (1.651 m), weight 176 lb 8 oz (80.06 kg).  LABORATORY DATA: Lab Results  Component Value Date   WBC 6.7 10/06/2013   HGB 10.7* 10/06/2013   HCT 32.8* 10/06/2013   MCV 99.1 10/06/2013   PLT 212 10/06/2013      Chemistry      Component Value Date/Time   NA 139 10/06/2013 0819   NA 147* 09/30/2013 0950   K 3.4* 10/06/2013 0819   K 4.7 09/30/2013 0950   CL 112 09/30/2013 0950   CL 105 03/19/2012 0811   CO2 21* 10/06/2013 0819   CO2 25 09/30/2013 0950   BUN 29.0* 10/06/2013 0819   BUN 26* 09/30/2013 0950   CREATININE 3.8* 10/06/2013 0819   CREATININE 3.77* 09/30/2013 0950   CREATININE 2.56* 01/14/2013 0625      Component Value Date/Time   CALCIUM 9.4 10/06/2013 0819   CALCIUM 9.6 09/30/2013 0950   CALCIUM 9.4 09/30/2013 0950   ALKPHOS 118 10/06/2013 0819   ALKPHOS 101 09/30/2013 0950   AST 20 10/06/2013 0819   AST 18 09/30/2013 0950   ALT 13 10/06/2013 0819   ALT 19 09/30/2013 0950   BILITOT 0.48 10/06/2013 0819   BILITOT 0.6 09/30/2013 0950     Other lab results: Beta-2 microglobulin 10.30, free kappa light chain 3.67, free lambda light chain 0.95, kappa/lambda ratio  3.86. Ig 582 gA 19 and IgM 19   RADIOGRAPHIC STUDIES:  ASSESSMENT AND PLAN: This is a very pleasant 73 years old white female with  relapsed multiple myeloma. She is currently undergoing systemic chemotherapy with Carfilzomib, Cytoxan and dexamethasone status post 4 cycles. Her myeloma panel is stable and the patient is not interested in resuming any treatment at this point. I discussed the lab result with the patient today and recommended for her to continue on observation with repeat myeloma panel in 3 months. For the end-stage renal disease, the patient is expected to start peritoneal dialysis soon.  She was advised to call immediately if she has any concerning symptoms in the interval.  Disclaimer: This note was dictated with voice recognition software. Similar sounding words can inadvertently be transcribed and may not be corrected upon review. [name]., MD 10/13/2013

## 2013-10-13 NOTE — Telephone Encounter (Signed)
gv pt appt schedule for dec

## 2013-10-26 ENCOUNTER — Encounter: Payer: Self-pay | Admitting: Internal Medicine

## 2013-10-26 ENCOUNTER — Ambulatory Visit (INDEPENDENT_AMBULATORY_CARE_PROVIDER_SITE_OTHER): Payer: Medicare Other | Admitting: Internal Medicine

## 2013-10-26 VITALS — BP 116/62 | HR 72 | Temp 98.1°F | Resp 16 | Ht 65.0 in | Wt 177.4 lb

## 2013-10-26 DIAGNOSIS — I959 Hypotension, unspecified: Secondary | ICD-10-CM

## 2013-10-26 DIAGNOSIS — L723 Sebaceous cyst: Secondary | ICD-10-CM

## 2013-10-26 NOTE — Progress Notes (Signed)
   Subjective:    Patient ID: Nancy Norris, female    DOB: June 19, 1940, 73 y.o.   MRN: [id number]  HPI Very nice 73 yo DWF with MM and Stage 5 CKD soon to start dialysishad been followed in the remote past for HTN and more recently had hypotension (2013)  treated w/Florinef - now off is monitored  Expectantly. She denies any recent CV Sx's. Tday she also presents with a very sore tender subcutaneous mass of the right mid back which was felt likely to be a sebaceous cyst. Meds/All/PMH - all reviewed   Review of Systems Non pertinent to above for today's problems.     Objective:   Physical Exam  BP 116/62  Pulse 72  Temp(Src) 98.1 F (36.7 C) (Temporal)  Resp 16  Ht 5\' 5"  (1.651 m)  Wt 177 lb 6.4 oz (80.468 kg)  BMI 29.52 kg/m2  After informed consent, Lesion site above was prepped and anesthetized w/ 4 cc Marcaine 0.5% w/epi. A #10 blade made a 1& 3/8" incision transverse across the back and a 1" x 1" sebaceous cyst was delivered intact by sharp dissection from the surrounding Subcutaneous fat. Wound cavity was inspected and free of any remnant sebaceous capsule. Then the wound was collapsed, edges everted for obliteration of the cyst cavity by #3 vertical mattress sutures of Nylon 3-0 and then approximated with #7 running sutures of Nylon 5-0. Antibiotic ung was applied & wound covered with a 4' x 6" Tegaderm and patient was instructed in wound care.  Assessment & Plan:   1) Hypotension, Dysautonomia , Hx  2) Large Sebaceous cyst of back- excised   ROV 1 wk for mattress sutures removal

## 2013-10-27 ENCOUNTER — Other Ambulatory Visit: Payer: Self-pay | Admitting: *Deleted

## 2013-10-27 MED ORDER — TRAMADOL HCL 50 MG PO TABS: ORAL_TABLET | ORAL | Status: DC

## 2013-11-02 ENCOUNTER — Encounter: Payer: Self-pay | Admitting: Internal Medicine

## 2013-11-02 ENCOUNTER — Ambulatory Visit: Payer: Medicare Other | Admitting: Internal Medicine

## 2013-11-02 VITALS — BP 122/64 | HR 72 | Temp 98.1°F | Resp 16 | Ht 65.0 in | Wt 178.0 lb

## 2013-11-02 DIAGNOSIS — F32A Depression, unspecified: Secondary | ICD-10-CM

## 2013-11-02 DIAGNOSIS — F329 Major depressive disorder, single episode, unspecified: Secondary | ICD-10-CM

## 2013-11-02 DIAGNOSIS — F339 Major depressive disorder, recurrent, unspecified: Secondary | ICD-10-CM | POA: Insufficient documentation

## 2013-11-02 DIAGNOSIS — L723 Sebaceous cyst: Secondary | ICD-10-CM

## 2013-11-02 MED ORDER — BUPROPION HCL ER (XL) 300 MG PO TB24: ORAL_TABLET | ORAL | Status: DC

## 2013-11-02 NOTE — Progress Notes (Signed)
   Subjective:    Patient ID: Nancy Norris, female    DOB: 27-May-1940, 73 y.o.   MRN: [id number]  HPI Byanka returs for suture removal after excision of a large sebaceous cyst of her back. She also mentions she feels her depression is relapsing after initial improvement adding Bupropion 150 mg XL to her citalopram 40 mg. Denies suicidal ideations.  Meds/Allergies/PMH - reviewed & unchanged.  Review of Systems Non contributory to above.   Objective:   Physical Exam  BP 122/64  Pulse 72  Temp(Src) 98.1 F (36.7 C) (Temporal)  Resp 16  Ht 5\' 5"  (1.651 m)  Wt 178 lb (80.74 kg)  BMI 29.62 kg/m2   Wound of back well healed with adequate healing ridge & no sign of infection. All (#9) sutures removed and antibiotic ung applied and covered with Tegaderm 4 x 6 ".  Assessment & Plan:   1) s/p excision of seb cyst  2) Endogenous & reactive depression  - Recc increase Bupropion to 300 mg XL and allow 3-6 weeks to assess response.

## 2013-11-03 ENCOUNTER — Other Ambulatory Visit: Payer: Self-pay | Admitting: Physician Assistant

## 2013-11-09 ENCOUNTER — Ambulatory Visit: Payer: Self-pay | Admitting: Internal Medicine

## 2013-11-16 ENCOUNTER — Encounter: Payer: Self-pay | Admitting: *Deleted

## 2013-11-19 ENCOUNTER — Other Ambulatory Visit: Payer: Self-pay | Admitting: Physician Assistant

## 2013-11-23 ENCOUNTER — Emergency Department (HOSPITAL_COMMUNITY)
Admission: EM | Admit: 2013-11-23 | Discharge: 2013-11-24 | Disposition: A | Discharge status: Home / Self Care | Payer: Medicare Other | Attending: Emergency Medicine | Admitting: Emergency Medicine

## 2013-11-23 ENCOUNTER — Emergency Department (HOSPITAL_COMMUNITY): Payer: Medicare Other

## 2013-11-23 ENCOUNTER — Encounter (HOSPITAL_COMMUNITY): Payer: Self-pay | Admitting: Emergency Medicine

## 2013-11-23 DIAGNOSIS — I252 Old myocardial infarction: Secondary | ICD-10-CM | POA: Insufficient documentation

## 2013-11-23 DIAGNOSIS — E039 Hypothyroidism, unspecified: Secondary | ICD-10-CM | POA: Insufficient documentation

## 2013-11-23 DIAGNOSIS — Z79899 Other long term (current) drug therapy: Secondary | ICD-10-CM | POA: Diagnosis not present

## 2013-11-23 DIAGNOSIS — K5732 Diverticulitis of large intestine without perforation or abscess without bleeding: Secondary | ICD-10-CM | POA: Diagnosis not present

## 2013-11-23 DIAGNOSIS — F419 Anxiety disorder, unspecified: Secondary | ICD-10-CM | POA: Diagnosis not present

## 2013-11-23 DIAGNOSIS — Z91040 Latex allergy status: Secondary | ICD-10-CM | POA: Diagnosis not present

## 2013-11-23 DIAGNOSIS — Z7982 Long term (current) use of aspirin: Secondary | ICD-10-CM | POA: Insufficient documentation

## 2013-11-23 DIAGNOSIS — F329 Major depressive disorder, single episode, unspecified: Secondary | ICD-10-CM | POA: Diagnosis not present

## 2013-11-23 DIAGNOSIS — Z8669 Personal history of other diseases of the nervous system and sense organs: Secondary | ICD-10-CM | POA: Diagnosis not present

## 2013-11-23 DIAGNOSIS — R1031 Right lower quadrant pain: Secondary | ICD-10-CM | POA: Insufficient documentation

## 2013-11-23 DIAGNOSIS — Z87891 Personal history of nicotine dependence: Secondary | ICD-10-CM | POA: Diagnosis not present

## 2013-11-23 DIAGNOSIS — K219 Gastro-esophageal reflux disease without esophagitis: Secondary | ICD-10-CM | POA: Diagnosis not present

## 2013-11-23 DIAGNOSIS — Z853 Personal history of malignant neoplasm of breast: Secondary | ICD-10-CM | POA: Diagnosis not present

## 2013-11-23 DIAGNOSIS — Z7952 Long term (current) use of systemic steroids: Secondary | ICD-10-CM | POA: Insufficient documentation

## 2013-11-23 DIAGNOSIS — N183 Chronic kidney disease, stage 3 (moderate): Secondary | ICD-10-CM | POA: Insufficient documentation

## 2013-11-23 DIAGNOSIS — J449 Chronic obstructive pulmonary disease, unspecified: Secondary | ICD-10-CM | POA: Insufficient documentation

## 2013-11-23 DIAGNOSIS — K5792 Diverticulitis of intestine, part unspecified, without perforation or abscess without bleeding: Secondary | ICD-10-CM

## 2013-11-23 LAB — CBC WITH DIFFERENTIAL/PLATELET
Basophils Absolute: 0 10*3/uL (ref 0.0–0.1)
Basophils Relative: 0 % (ref 0–1)
Eosinophils Absolute: 0.3 10*3/uL (ref 0.0–0.7)
Eosinophils Relative: 4 % (ref 0–5)
HCT: 28.8 % — ABNORMAL LOW (ref 36.0–46.0)
HEMOGLOBIN: 9.5 g/dL — AB (ref 12.0–15.0)
LYMPHS ABS: 1.3 10*3/uL (ref 0.7–4.0)
Lymphocytes Relative: 19 % (ref 12–46)
MCH: 32.2 pg (ref 26.0–34.0)
MCHC: 33 g/dL (ref 30.0–36.0)
MCV: 97.6 fL (ref 78.0–100.0)
MONOS PCT: 12 % (ref 3–12)
Monocytes Absolute: 0.8 10*3/uL (ref 0.1–1.0)
NEUTROS ABS: 4.6 10*3/uL (ref 1.7–7.7)
NEUTROS PCT: 65 % (ref 43–77)
Platelets: 246 10*3/uL (ref 150–400)
RBC: 2.95 MIL/uL — ABNORMAL LOW (ref 3.87–5.11)
RDW: 13.9 % (ref 11.5–15.5)
WBC: 7.1 10*3/uL (ref 4.0–10.5)

## 2013-11-23 LAB — COMPREHENSIVE METABOLIC PANEL
ALT: 14 U/L (ref 0–35)
ANION GAP: 16 — AB (ref 5–15)
AST: 14 U/L (ref 0–37)
Albumin: 3.4 g/dL — ABNORMAL LOW (ref 3.5–5.2)
Alkaline Phosphatase: 107 U/L (ref 39–117)
BUN: 33 mg/dL — ABNORMAL HIGH (ref 6–23)
CALCIUM: 8.3 mg/dL — AB (ref 8.4–10.5)
CO2: 19 mEq/L (ref 19–32)
Chloride: 107 mEq/L (ref 96–112)
Creatinine, Ser: 3.87 mg/dL — ABNORMAL HIGH (ref 0.50–1.10)
GFR calc Af Amer: 12 mL/min — ABNORMAL LOW (ref >90)
GFR calc non Af Amer: 11 mL/min — ABNORMAL LOW (ref >90)
Glucose, Bld: 105 mg/dL — ABNORMAL HIGH (ref 70–99)
Potassium: 3.5 mEq/L — ABNORMAL LOW (ref 3.7–5.3)
SODIUM: 142 meq/L (ref 137–147)
TOTAL PROTEIN: 6.5 g/dL (ref 6.0–8.3)
Total Bilirubin: 0.2 mg/dL — ABNORMAL LOW (ref 0.3–1.2)

## 2013-11-23 LAB — URINE MICROSCOPIC-ADD ON

## 2013-11-23 LAB — URINALYSIS, ROUTINE W REFLEX MICROSCOPIC
Bilirubin Urine: NEGATIVE
GLUCOSE, UA: 250 mg/dL — AB
Ketones, ur: NEGATIVE mg/dL
Leukocytes, UA: NEGATIVE
Nitrite: NEGATIVE
Protein, ur: 100 mg/dL — AB
SPECIFIC GRAVITY, URINE: 1.016 (ref 1.005–1.030)
Urobilinogen, UA: 0.2 mg/dL (ref 0.0–1.0)
pH: 7 (ref 5.0–8.0)

## 2013-11-23 MED ORDER — FAMOTIDINE IN NACL 20-0.9 MG/50ML-% IV SOLN
20.0000 mg | Freq: Once | INTRAVENOUS | Status: AC
  Administered 2013-11-24: 20 mg via INTRAVENOUS
  Filled 2013-11-23: qty 50

## 2013-11-23 MED ORDER — HYDROMORPHONE HCL 1 MG/ML IJ SOLN
1.0000 mg | Freq: Once | INTRAMUSCULAR | Status: AC
  Administered 2013-11-24: 1 mg via INTRAVENOUS
  Filled 2013-11-23: qty 1

## 2013-11-23 MED ORDER — ONDANSETRON HCL 4 MG/2ML IJ SOLN
4.0000 mg | Freq: Once | INTRAMUSCULAR | Status: AC
  Administered 2013-11-23: 4 mg via INTRAVENOUS
  Filled 2013-11-23: qty 2

## 2013-11-23 MED ORDER — HYDROMORPHONE HCL 1 MG/ML IJ SOLN
1.0000 mg | Freq: Once | INTRAMUSCULAR | Status: AC
  Administered 2013-11-23: 1 mg via INTRAVENOUS
  Filled 2013-11-23: qty 1

## 2013-11-23 MED ORDER — ONDANSETRON HCL 4 MG/2ML IJ SOLN
4.0000 mg | Freq: Once | INTRAMUSCULAR | Status: AC
  Administered 2013-11-24: 4 mg via INTRAVENOUS
  Filled 2013-11-23: qty 2

## 2013-11-23 NOTE — ED Provider Notes (Signed)
CSN: [id number]     Arrival date & time 11/23/13  1850 History   First MD Initiated Contact with Patient 11/23/13 1954     Chief Complaint  Patient presents with  . Abdominal Pain     (Consider location/radiation/quality/duration/timing/severity/associated sxs/prior Treatment) HPI Comments: Patient with a history of Multiple Myeloma, CKD,  Breast Cancer presents today with abdominal pain.  She reports that the pain is located in the RLQ and radiates to the right lower back.  Pain has been present for the past 2 weeks.  She describes it as a dull throbbing pain.  Pain is worse with palpation.  She states that she has not been taking anything for her pain.  She reports associated nausea, but denies vomiting.  Denies fever, chills, or diarrhea.  Denies urinary symptoms.  She does report associated constipation.  Last normal BM was one week ago.  She reports that she did have a small stringy bowel movement two days ago.  She reports that she has never had pain like this before.  Past abdominal surgical history significant for Abdominal Hysterectomy and Cholecystectomy.  She also reports that she thinks that she has had an Appendectomy as well, although this is not listed in her chart.  The history is provided by the patient.    Past Medical History  Diagnosis Date  . Hyperkalemia   . Hypothyroidism   . COPD (chronic obstructive pulmonary disease)   . Hyponatremia   . Dizziness   . Fibromyalgia   . Breast cancer   . Multiple myeloma   . Mucositis   . Hx of echocardiogram     a.  Echocardiogram (12/26/2012): EF 28-36%, grade 1 diastolic dysfunction;   b.  Echocardiogram (02/2013): EF 55-60%, no WMA, trivial effusion  . Fibromyalgia   . Arthritis   . Hx of cardiovascular stress test     LexiScan with low level exercise Myoview (02/2013): No ischemia, EF 72%; normal study  . Complication of anesthesia   . PONV (postoperative nausea and vomiting) 2008    after mastestomy  . Myocardial  infarction     in past, patient was unaware.   . Anginal pain     used NTG x 2 May 31 and 06/15/13   . GERD (gastroesophageal reflux disease)   . Headache(784.0)   . Anxiety   . Depression   . CKD (chronic kidney disease) stage 3, GFR 30-59 ml/min   . Anemia   . History of blood transfusion     last one May 12   . Neuropathy    Past Surgical History  Procedure Laterality Date  . History of port removal    . Status post stem cell transplant on September 28, 2008.    Marland Kitchen Abdominal hysterectomy  1981  . Cholecystectomy  1971  . Mastectomy Left 2008  . Cataract extraction, bilateral    . Breast surgery    . Eye surgery Bilateral     lens implant  . Breast reconstruction    . Portacath placement  12/2012    has had 2  . Av fistula placement Left 06/19/2013    Procedure: CREATION OF LEFT ARM ARTERIOVENOUS (AV) FISTULA ;  Surgeon: Angelia Mould, MD;  Location: [facility] OR;  Service: Vascular;  Laterality: Left;   Family History  Problem Relation Age of Onset  . Arthritis Mother   . Asthma Mother   . Cancer Sister   . Hyperlipidemia Brother    History  Substance Use Topics  .  Smoking status: Former Smoker -- 1.00 packs/day for 30 years    Types: Cigarettes    Quit date: 02/15/2005  . Smokeless tobacco: Never Used  . Alcohol Use: No   OB History    No data available     Review of Systems  All other systems reviewed and are negative.     Allergies  Codeine; Latex; Iodinated diagnostic agents; Other; Sulfa antibiotics; and Adhesive  Home Medications   Prior to Admission medications   Medication Sig Start Date End Date Taking? Authorizing Provider  acetaminophen (TYLENOL) 325 MG tablet Take 650 mg by mouth every 6 (six) hours as needed for mild pain or moderate pain.     Historical Provider, MD  albuterol (PROVENTIL HFA;VENTOLIN HFA) 108 (90 BASE) MCG/ACT inhaler Inhale 2 puffs into the lungs 2 (two) times daily as needed for wheezing or shortness of breath. 06/04/13    [name], MD  ascorbic acid (VITAMIN C) 250 MG CHEW Chew 250 mg by mouth daily.    Historical Provider, MD  aspirin EC 81 MG tablet Take 1 tablet (81 mg total) by mouth daily. 01/29/13   Liliane Shi, PA-C  buPROPion (WELLBUTRIN XL) 300 MG 24 hr tablet Take 1 tablet each morning for Mood & Depression 11/02/13   [name], MD  cetirizine (ZYRTEC) 10 MG tablet Take 10 mg by mouth at bedtime.     Historical Provider, MD  cholecalciferol (VITAMIN D) 1000 UNITS tablet Take 4,000 Units by mouth daily.    Historical Provider, MD  citalopram (CELEXA) 40 MG tablet TAKE 1 TABLET BY MOUTH ONCE DAILY. 11/03/13   [name], MD  cromolyn (OPTICROM) 4 % ophthalmic solution Place 1 drop into both eyes 4 (four) times daily.    Historical Provider, MD  cyanocobalamin 2000 MCG tablet Take 2,000 mcg by mouth daily.     Historical Provider, MD  esomeprazole (NEXIUM) 40 MG capsule Take 1 capsule (40 mg total) by mouth daily. Each morning for acid indigestion 02/24/13 02/24/14  [name], MD  Ferrous Sulfate 134 MG TABS Take 1 tablet by mouth daily.     Historical Provider, MD  fluticasone (FLONASE) 50 MCG/ACT nasal spray Place 2 sprays into both nostrils at bedtime.  03/17/13   Historical Provider, MD  gabapentin (NEURONTIN) 300 MG capsule Take 1 capsule (300 mg total) by mouth at bedtime. 07/06/13   [name], MD  isosorbide mononitrate (IMDUR) 30 MG 24 hr tablet Take 30 mg by mouth daily. Patient taking 15 mg, had a sever head ache with $RemoveB'30mg'aDVpYPit$ .    Historical Provider, MD  levothyroxine (SYNTHROID, LEVOTHROID) 150 MCG tablet TAKE 1 TABLET BY MOUTH ONCE DAILY.  1 1/2 tablet on saturday and sunday 01/28/13   Ardis Hughs, PA-C  levothyroxine (SYNTHROID, LEVOTHROID) 150 MCG tablet TAKE 1 TABLET BY MOUTH ONCE DAILY. 11/19/13   [name], MD  loteprednol (LOTEMAX) 0.2 % SUSP Place 1 drop into both eyes 3 (three) times daily.     Historical Provider, MD  nitroGLYCERIN (NITROSTAT) 0.4 MG SL  tablet Place 1 tablet (0.4 mg total) under the tongue every 5 (five) minutes as needed for chest pain. 01/29/13   Liliane Shi, PA-C  ondansetron (ZOFRAN-ODT) 8 MG disintegrating tablet Take 1 tablet (8 mg total) by mouth every 8 (eight) hours as needed for nausea or vomiting. 01/27/13   Carlton Adam, PA-C  prochlorperazine (COMPAZINE) 10 MG tablet TAKE 1 TABLET BY MOUTH EVERY 6 HOURS AS NEEDED FOR NAUSEA OR  VOMITING. 09/29/13   Curt Bears, MD  ranitidine (ZANTAC) 300 MG tablet Take 1 tablet (300 mg total) by mouth at bedtime. For acid reflux and indigestion 02/24/13 02/24/14  [name], MD  sucralfate (CARAFATE) 1 G tablet Take 1 tablet (1 g total) by mouth 4 (four) times daily. Dissolve in water and swallow 02/24/13 02/24/14  [name], MD  traMADol Veatrice Bourbon) 50 MG tablet Take 1-2 tablets QID PRN pain 10/27/13   [name], MD  Umeclidinium-Vilanterol 62.5-25 MCG/INH AEPB Inhale 1 puff into the lungs daily. Use daily 02/24/13   [name], MD  zolpidem (AMBIEN) 5 MG tablet Take 5 mg by mouth at bedtime as needed for sleep.    Historical Provider, MD   BP 111/63 mmHg  Pulse 90  Temp(Src) 98.7 F (37.1 C)  Resp 18  SpO2 97% Physical Exam  Constitutional: She appears well-developed and well-nourished.  HENT:  Head: Normocephalic and atraumatic.  Mouth/Throat: Oropharynx is clear and moist.  Neck: Normal range of motion. Neck supple.  Cardiovascular: Normal rate, regular rhythm and normal heart sounds.   Pulmonary/Chest: Effort normal and breath sounds normal.  Abdominal: Soft. Bowel sounds are normal. She exhibits no distension and no mass. There is tenderness. There is no rebound and no guarding.  Tenderness to palpation of the RLQ  Genitourinary:  No fecal impaction  Musculoskeletal: Normal range of motion.  Neurological: She is alert.  Skin: Skin is warm and dry.  Psychiatric: She has a normal mood and affect.  Nursing note and vitals reviewed.   ED Course   Procedures (including critical care time) Labs Review Labs Reviewed  COMPREHENSIVE METABOLIC PANEL - Abnormal; Notable for the following:    Potassium 3.5 (*)    Glucose, Bld 105 (*)    BUN 33 (*)    Creatinine, Ser 3.87 (*)    Calcium 8.3 (*)    Albumin 3.4 (*)    Total Bilirubin 0.2 (*)    GFR calc non Af Amer 11 (*)    GFR calc Af Amer 12 (*)    Anion gap 16 (*)    All other components within normal limits  CBC WITH DIFFERENTIAL - Abnormal; Notable for the following:    RBC 2.95 (*)    Hemoglobin 9.5 (*)    HCT 28.8 (*)    All other components within normal limits  URINALYSIS, ROUTINE W REFLEX MICROSCOPIC    Imaging Review Ct Abdomen Pelvis Wo Contrast  11/23/2013   CLINICAL DATA:  RIGHT lower quadrant pain worsening over 2 weeks. Constipation for 1 week. Rectal pressure. Dialysis patient.  EXAM: CT ABDOMEN AND PELVIS WITHOUT CONTRAST  TECHNIQUE: Multidetector CT imaging of the abdomen and pelvis was performed following the standard protocol without IV contrast. Oral contrast administered.  COMPARISON:  None.  FINDINGS: LUNG BASES: Included view of the lung bases are clear. Visualized heart appears mildly enlarged. Catheter an distal superior vena cava partially imaged. No pericardial fluid collections.  KIDNEYS/BLADDER: Kidneys are orthotopic, bilaterally mildly atrophic kidneys. No nephrolithiasis, hydronephrosis; limited assessment for renal masses on this nonenhanced examination. The unopacified ureters are normal in course and caliber. Urinary bladder is well distended and unremarkable.  SOLID ORGANS: The spleen, pancreas and RIGHT adrenal gland are unremarkable for this non-contrast examination. Mildly dense liver arm can be seen with amiodarone use. 13 mm LEFT adrenal adenoma (2 Hounsfield units). not visualize gallbladder, likely reflective of cholecystectomy.  GASTROINTESTINAL TRACT: Small hiatal hernia. Contrast reflection into the distal esophagus. The stomach,  small and  large bowel are normal in course and caliber without inflammatory changes, mild colonic diverticulosis with superimposed focal mild inflammation within the rectosigmoid junction. The appendix is not discretely identified, however there are no inflammatory changes in the right lower quadrant. Mild amount of retained large bowel stool.  PERITONEUM/RETROPERITONEUM: No intraperitoneal free fluid nor free air. Aortoiliac vessels are normal in course and caliber, moderate calcific atherosclerosis. No lymphadenopathy by CT size criteria. Status post hysterectomy.  SOFT TISSUES/ OSSEOUS STRUCTURES: Nonsuspicious. LEFT breast implant partially imaged. Small fat containing umbilical hernia. Severe L5-S1 degenerative disc. Minimal grade 1 L4-5 anterolisthesis on degenerative basis. Mild broad dextroscoliosis on this nonweightbearing examination.  IMPRESSION: Mild rectosigmoid diverticulitis without complication. Mild amount of retained large bowel stool without obstruction.  Mildly atrophic kidneys without obstructive uropathy.   Electronically Signed   By: Elon Alas   On: 11/23/2013 23:48     EKG Interpretation None      MDM   Final diagnoses:  None   Patient is a 73 year old female that presents today with a chief complaint of RLQ abdominal pain that has been present for the past 2 weeks.  On exam, the patient does have tenderness localized to the RLQ without rebound or guarding.  Labs today unremarkable.  Creatine elevated, but does appear to be at baseline.  CT ab/pelvis showing Diverticulitis without complication.  Pain and nausea controlled while in the ED.  Patient discharged home with Cipro and Flagyl.  Pharmacy consulted regarding the Cipro dose due to the patient's CKD.  They recommended giving Cipro 500 mg, but only once a day.  Patient stable for discharge.  Patient given referral to GI. Patient also discharged home with pain medication and Zofran.  Return precautions given.  Patient also  evaluated by Dr. Maryan Rued who is in agreement with the plan.    Hyman Bible, PA-C 11/25/13 1638  Blanchie Dessert, MD 11/25/13 [phone]

## 2013-11-23 NOTE — ED Notes (Addendum)
Pt reports throbbing/burning pain in RLQ x 2 weeks. sts when she puts pressure on rectum it feels like her insides and pushing up and if she stands up it feels like insides are going to fall out. Pt also reports constipation x 1 week and no appetite. Pt does not have appendix.

## 2013-11-23 NOTE — ED Notes (Signed)
Report to [place], Therapist, sports.  Pt care transferred

## 2013-11-24 MED ORDER — HEPARIN SOD (PORK) LOCK FLUSH 100 UNIT/ML IV SOLN
500.0000 [IU] | INTRAVENOUS | Status: AC | PRN
  Administered 2013-11-24: 500 [IU]

## 2013-11-24 MED ORDER — METRONIDAZOLE 500 MG PO TABS: 500.0000 mg | ORAL_TABLET | Freq: Three times a day (TID) | ORAL | Status: DC

## 2013-11-24 MED ORDER — TRAMADOL HCL 50 MG PO TABS: 50.0000 mg | ORAL_TABLET | Freq: Four times a day (QID) | ORAL | Status: DC | PRN

## 2013-11-24 MED ORDER — CIPROFLOXACIN HCL 500 MG PO TABS: 500.0000 mg | ORAL_TABLET | Freq: Every day | ORAL | Status: DC

## 2013-11-24 MED ORDER — ONDANSETRON 4 MG PO TBDP
8.0000 mg | ORAL_TABLET | Freq: Once | ORAL | Status: AC
  Administered 2013-11-24: 8 mg via ORAL
  Filled 2013-11-24: qty 2

## 2013-11-24 MED ORDER — SODIUM CHLORIDE 0.9 % IJ SOLN
10.0000 mL | INTRAMUSCULAR | Status: DC | PRN
  Administered 2013-11-24: 10 mL
  Filled 2013-11-24: qty 40

## 2013-12-02 ENCOUNTER — Observation Stay (HOSPITAL_COMMUNITY): Payer: Medicare Other

## 2013-12-02 ENCOUNTER — Observation Stay (HOSPITAL_COMMUNITY)
Admission: EM | Admit: 2013-12-02 | Discharge: 2013-12-02 | Disposition: A | Discharge status: Home / Self Care | Payer: Medicare Other | Attending: Internal Medicine | Admitting: Internal Medicine

## 2013-12-02 ENCOUNTER — Encounter (HOSPITAL_COMMUNITY): Payer: Self-pay

## 2013-12-02 ENCOUNTER — Other Ambulatory Visit (INDEPENDENT_AMBULATORY_CARE_PROVIDER_SITE_OTHER): Payer: Self-pay | Admitting: Surgery

## 2013-12-02 ENCOUNTER — Emergency Department (HOSPITAL_COMMUNITY): Payer: Medicare Other

## 2013-12-02 DIAGNOSIS — Z79899 Other long term (current) drug therapy: Secondary | ICD-10-CM | POA: Insufficient documentation

## 2013-12-02 DIAGNOSIS — K219 Gastro-esophageal reflux disease without esophagitis: Secondary | ICD-10-CM | POA: Insufficient documentation

## 2013-12-02 DIAGNOSIS — I252 Old myocardial infarction: Secondary | ICD-10-CM | POA: Diagnosis not present

## 2013-12-02 DIAGNOSIS — R079 Chest pain, unspecified: Secondary | ICD-10-CM | POA: Diagnosis not present

## 2013-12-02 DIAGNOSIS — M797 Fibromyalgia: Secondary | ICD-10-CM | POA: Diagnosis not present

## 2013-12-02 DIAGNOSIS — I209 Angina pectoris, unspecified: Secondary | ICD-10-CM | POA: Insufficient documentation

## 2013-12-02 DIAGNOSIS — D649 Anemia, unspecified: Secondary | ICD-10-CM | POA: Insufficient documentation

## 2013-12-02 DIAGNOSIS — Z91040 Latex allergy status: Secondary | ICD-10-CM | POA: Diagnosis not present

## 2013-12-02 DIAGNOSIS — Z8579 Personal history of other malignant neoplasms of lymphoid, hematopoietic and related tissues: Secondary | ICD-10-CM | POA: Diagnosis not present

## 2013-12-02 DIAGNOSIS — E871 Hypo-osmolality and hyponatremia: Secondary | ICD-10-CM | POA: Insufficient documentation

## 2013-12-02 DIAGNOSIS — I10 Essential (primary) hypertension: Secondary | ICD-10-CM | POA: Diagnosis present

## 2013-12-02 DIAGNOSIS — Z853 Personal history of malignant neoplasm of breast: Secondary | ICD-10-CM | POA: Diagnosis not present

## 2013-12-02 DIAGNOSIS — M1990 Unspecified osteoarthritis, unspecified site: Secondary | ICD-10-CM | POA: Insufficient documentation

## 2013-12-02 DIAGNOSIS — Z7982 Long term (current) use of aspirin: Secondary | ICD-10-CM | POA: Insufficient documentation

## 2013-12-02 DIAGNOSIS — J449 Chronic obstructive pulmonary disease, unspecified: Secondary | ICD-10-CM | POA: Diagnosis not present

## 2013-12-02 DIAGNOSIS — J439 Emphysema, unspecified: Secondary | ICD-10-CM | POA: Diagnosis present

## 2013-12-02 DIAGNOSIS — E875 Hyperkalemia: Secondary | ICD-10-CM | POA: Diagnosis not present

## 2013-12-02 DIAGNOSIS — C9000 Multiple myeloma not having achieved remission: Secondary | ICD-10-CM | POA: Diagnosis present

## 2013-12-02 DIAGNOSIS — K1230 Oral mucositis (ulcerative), unspecified: Secondary | ICD-10-CM | POA: Insufficient documentation

## 2013-12-02 DIAGNOSIS — F419 Anxiety disorder, unspecified: Secondary | ICD-10-CM | POA: Diagnosis not present

## 2013-12-02 DIAGNOSIS — G629 Polyneuropathy, unspecified: Secondary | ICD-10-CM | POA: Diagnosis not present

## 2013-12-02 DIAGNOSIS — N183 Chronic kidney disease, stage 3 (moderate): Secondary | ICD-10-CM | POA: Insufficient documentation

## 2013-12-02 DIAGNOSIS — J438 Other emphysema: Secondary | ICD-10-CM

## 2013-12-02 DIAGNOSIS — Z792 Long term (current) use of antibiotics: Secondary | ICD-10-CM | POA: Insufficient documentation

## 2013-12-02 DIAGNOSIS — F329 Major depressive disorder, single episode, unspecified: Secondary | ICD-10-CM | POA: Insufficient documentation

## 2013-12-02 DIAGNOSIS — E039 Hypothyroidism, unspecified: Secondary | ICD-10-CM | POA: Insufficient documentation

## 2013-12-02 DIAGNOSIS — K210 Gastro-esophageal reflux disease with esophagitis: Secondary | ICD-10-CM

## 2013-12-02 DIAGNOSIS — R0789 Other chest pain: Secondary | ICD-10-CM

## 2013-12-02 DIAGNOSIS — Z87891 Personal history of nicotine dependence: Secondary | ICD-10-CM | POA: Diagnosis not present

## 2013-12-02 DIAGNOSIS — R110 Nausea: Secondary | ICD-10-CM | POA: Insufficient documentation

## 2013-12-02 DIAGNOSIS — N184 Chronic kidney disease, stage 4 (severe): Secondary | ICD-10-CM

## 2013-12-02 DIAGNOSIS — R071 Chest pain on breathing: Secondary | ICD-10-CM

## 2013-12-02 DIAGNOSIS — F339 Major depressive disorder, recurrent, unspecified: Secondary | ICD-10-CM | POA: Diagnosis present

## 2013-12-02 DIAGNOSIS — I5032 Chronic diastolic (congestive) heart failure: Secondary | ICD-10-CM

## 2013-12-02 LAB — CBC WITH DIFFERENTIAL/PLATELET
BASOS PCT: 0 % (ref 0–1)
Basophils Absolute: 0 10*3/uL (ref 0.0–0.1)
Eosinophils Absolute: 0.3 10*3/uL (ref 0.0–0.7)
Eosinophils Relative: 3 % (ref 0–5)
HCT: 32.2 % — ABNORMAL LOW (ref 36.0–46.0)
Hemoglobin: 10.6 g/dL — ABNORMAL LOW (ref 12.0–15.0)
LYMPHS PCT: 19 % (ref 12–46)
Lymphs Abs: 1.6 10*3/uL (ref 0.7–4.0)
MCH: 33.1 pg (ref 26.0–34.0)
MCHC: 32.9 g/dL (ref 30.0–36.0)
MCV: 100.6 fL — ABNORMAL HIGH (ref 78.0–100.0)
Monocytes Absolute: 0.9 10*3/uL (ref 0.1–1.0)
Monocytes Relative: 10 % (ref 3–12)
NEUTROS ABS: 5.5 10*3/uL (ref 1.7–7.7)
Neutrophils Relative %: 68 % (ref 43–77)
Platelets: 254 10*3/uL (ref 150–400)
RBC: 3.2 MIL/uL — ABNORMAL LOW (ref 3.87–5.11)
RDW: 14.4 % (ref 11.5–15.5)
WBC: 8.3 10*3/uL (ref 4.0–10.5)

## 2013-12-02 LAB — BASIC METABOLIC PANEL
Anion gap: 16 — ABNORMAL HIGH (ref 5–15)
BUN: 25 mg/dL — ABNORMAL HIGH (ref 6–23)
CALCIUM: 9.7 mg/dL (ref 8.4–10.5)
CO2: 18 meq/L — AB (ref 19–32)
Chloride: 108 mEq/L (ref 96–112)
Creatinine, Ser: 3.65 mg/dL — ABNORMAL HIGH (ref 0.50–1.10)
GFR calc non Af Amer: 11 mL/min — ABNORMAL LOW (ref >90)
GFR, EST AFRICAN AMERICAN: 13 mL/min — AB (ref >90)
Glucose, Bld: 113 mg/dL — ABNORMAL HIGH (ref 70–99)
Potassium: 3.5 mEq/L — ABNORMAL LOW (ref 3.7–5.3)
SODIUM: 142 meq/L (ref 137–147)

## 2013-12-02 LAB — CREATININE, SERUM
Creatinine, Ser: 3.59 mg/dL — ABNORMAL HIGH (ref 0.50–1.10)
GFR calc Af Amer: 14 mL/min — ABNORMAL LOW (ref >90)
GFR calc non Af Amer: 12 mL/min — ABNORMAL LOW (ref >90)

## 2013-12-02 LAB — CBC
HCT: 34.1 % — ABNORMAL LOW (ref 36.0–46.0)
Hemoglobin: 11 g/dL — ABNORMAL LOW (ref 12.0–15.0)
MCH: 31.9 pg (ref 26.0–34.0)
MCHC: 32.3 g/dL (ref 30.0–36.0)
MCV: 98.8 fL (ref 78.0–100.0)
PLATELETS: 291 10*3/uL (ref 150–400)
RBC: 3.45 MIL/uL — ABNORMAL LOW (ref 3.87–5.11)
RDW: 14.4 % (ref 11.5–15.5)
WBC: 8.9 10*3/uL (ref 4.0–10.5)

## 2013-12-02 LAB — TROPONIN I
Troponin I: <0.3 ng/mL (ref <0.30)
Troponin I: <0.3 ng/mL

## 2013-12-02 MED ORDER — PANTOPRAZOLE SODIUM 40 MG PO TBEC
40.0000 mg | DELAYED_RELEASE_TABLET | Freq: Every day | ORAL | Status: DC
  Administered 2013-12-02: 40 mg via ORAL
  Filled 2013-12-02: qty 1

## 2013-12-02 MED ORDER — LORATADINE 10 MG PO TABS
10.0000 mg | ORAL_TABLET | Freq: Every day | ORAL | Status: DC
Start: 2013-12-02 — End: 2013-12-02
  Administered 2013-12-02: 10 mg via ORAL
  Filled 2013-12-02: qty 1

## 2013-12-02 MED ORDER — REGADENOSON 0.4 MG/5ML IV SOLN
INTRAVENOUS | Status: AC
  Administered 2013-12-02: 0.4 mg
  Filled 2013-12-02: qty 5

## 2013-12-02 MED ORDER — FERROUS SULFATE 75 (15 FE) MG/ML PO SOLN: 134.0000 mg | ORAL | Status: DC

## 2013-12-02 MED ORDER — ASPIRIN EC 81 MG PO TBEC: 81.0000 mg | DELAYED_RELEASE_TABLET | Freq: Every day | ORAL | Status: DC

## 2013-12-02 MED ORDER — ASPIRIN EC 81 MG PO TBEC
81.0000 mg | DELAYED_RELEASE_TABLET | Freq: Every day | ORAL | Status: DC
  Administered 2013-12-02: 81 mg via ORAL
  Filled 2013-12-02: qty 1

## 2013-12-02 MED ORDER — ALBUTEROL SULFATE (2.5 MG/3ML) 0.083% IN NEBU: 3.0000 mL | INHALATION_SOLUTION | Freq: Two times a day (BID) | RESPIRATORY_TRACT | Status: DC | PRN

## 2013-12-02 MED ORDER — CITALOPRAM HYDROBROMIDE 20 MG PO TABS
40.0000 mg | ORAL_TABLET | Freq: Every day | ORAL | Status: DC
  Administered 2013-12-02: 40 mg via ORAL
  Filled 2013-12-02: qty 2

## 2013-12-02 MED ORDER — ATORVASTATIN CALCIUM 40 MG PO TABS: 40.0000 mg | ORAL_TABLET | Freq: Every day | ORAL | Status: DC

## 2013-12-02 MED ORDER — CIPROFLOXACIN HCL 500 MG PO TABS
500.0000 mg | ORAL_TABLET | Freq: Every day | ORAL | Status: DC
  Administered 2013-12-02: 500 mg via ORAL
  Filled 2013-12-02: qty 1

## 2013-12-02 MED ORDER — ONDANSETRON HCL 4 MG/2ML IJ SOLN: 4.0000 mg | Freq: Four times a day (QID) | INTRAMUSCULAR | Status: DC | PRN

## 2013-12-02 MED ORDER — METRONIDAZOLE 500 MG PO TABS
500.0000 mg | ORAL_TABLET | Freq: Three times a day (TID) | ORAL | Status: DC
  Administered 2013-12-02 (×2): 500 mg via ORAL
  Filled 2013-12-02 (×2): qty 1

## 2013-12-02 MED ORDER — GI COCKTAIL ~~LOC~~
30.0000 mL | Freq: Once | ORAL | Status: AC
  Administered 2013-12-02: 30 mL via ORAL
  Filled 2013-12-02: qty 30

## 2013-12-02 MED ORDER — ONDANSETRON HCL 4 MG/2ML IJ SOLN
4.0000 mg | Freq: Four times a day (QID) | INTRAMUSCULAR | Status: DC | PRN
  Administered 2013-12-02 (×2): 4 mg via INTRAVENOUS
  Filled 2013-12-02 (×2): qty 2

## 2013-12-02 MED ORDER — VITAMIN B-12 1000 MCG PO TABS
2000.0000 ug | ORAL_TABLET | Freq: Every day | ORAL | Status: DC
  Administered 2013-12-02: 2000 ug via ORAL
  Filled 2013-12-02: qty 2

## 2013-12-02 MED ORDER — FLUTICASONE PROPIONATE 50 MCG/ACT NA SUSP
2.0000 | Freq: Every day | NASAL | Status: DC
  Filled 2013-12-02: qty 16

## 2013-12-02 MED ORDER — LEVOTHYROXINE SODIUM 75 MCG PO TABS: 225.0000 ug | ORAL_TABLET | ORAL | Status: DC

## 2013-12-02 MED ORDER — FAMOTIDINE 20 MG PO TABS
20.0000 mg | ORAL_TABLET | Freq: Every day | ORAL | Status: DC
  Administered 2013-12-02: 20 mg via ORAL
  Filled 2013-12-02: qty 1

## 2013-12-02 MED ORDER — VITAMIN C 250 MG PO TABS
250.0000 mg | ORAL_TABLET | Freq: Every day | ORAL | Status: DC
  Administered 2013-12-02: 250 mg via ORAL
  Filled 2013-12-02: qty 1

## 2013-12-02 MED ORDER — ISOSORBIDE MONONITRATE ER 30 MG PO TB24: 15.0000 mg | ORAL_TABLET | Freq: Every day | ORAL | Status: DC

## 2013-12-02 MED ORDER — TRAMADOL HCL 50 MG PO TABS
50.0000 mg | ORAL_TABLET | Freq: Once | ORAL | Status: AC
  Administered 2013-12-02: 50 mg via ORAL
  Filled 2013-12-02: qty 1

## 2013-12-02 MED ORDER — TECHNETIUM TC 99M SESTAMIBI GENERIC - CARDIOLITE
30.0000 | Freq: Once | INTRAVENOUS | Status: AC | PRN
  Administered 2013-12-02: 30 via INTRAVENOUS

## 2013-12-02 MED ORDER — TRAMADOL HCL 50 MG PO TABS
50.0000 mg | ORAL_TABLET | Freq: Four times a day (QID) | ORAL | Status: DC | PRN
  Administered 2013-12-02: 50 mg via ORAL
  Filled 2013-12-02: qty 1

## 2013-12-02 MED ORDER — CHLORHEXIDINE GLUCONATE 4 % EX LIQD: 1.0000 "application " | Freq: Once | CUTANEOUS | Status: DC

## 2013-12-02 MED ORDER — ZOLPIDEM TARTRATE 5 MG PO TABS: 5.0000 mg | ORAL_TABLET | Freq: Every evening | ORAL | Status: DC | PRN

## 2013-12-02 MED ORDER — OLOPATADINE HCL 0.1 % OP SOLN
1.0000 [drp] | Freq: Two times a day (BID) | OPHTHALMIC | Status: DC
  Administered 2013-12-02: 1 [drp] via OPHTHALMIC
  Filled 2013-12-02: qty 5

## 2013-12-02 MED ORDER — GABAPENTIN 300 MG PO CAPS: 300.0000 mg | ORAL_CAPSULE | Freq: Every day | ORAL | Status: DC

## 2013-12-02 MED ORDER — SUCRALFATE 1 GM/10ML PO SUSP
1.0000 g | Freq: Four times a day (QID) | ORAL | Status: DC
  Administered 2013-12-02: 1 g via ORAL
  Filled 2013-12-02: qty 10

## 2013-12-02 MED ORDER — CYCLOSPORINE 0.05 % OP EMUL
1.0000 [drp] | Freq: Two times a day (BID) | OPHTHALMIC | Status: DC
  Administered 2013-12-02: 1 [drp] via OPHTHALMIC
  Filled 2013-12-02 (×2): qty 1

## 2013-12-02 MED ORDER — LEVOTHYROXINE SODIUM 75 MCG PO TABS
150.0000 ug | ORAL_TABLET | ORAL | Status: DC
  Administered 2013-12-02: 150 ug via ORAL
  Filled 2013-12-02: qty 2

## 2013-12-02 MED ORDER — CROMOLYN SODIUM 4 % OP SOLN
1.0000 [drp] | Freq: Four times a day (QID) | OPHTHALMIC | Status: DC
  Administered 2013-12-02: 1 [drp] via OPHTHALMIC
  Filled 2013-12-02 (×2): qty 10

## 2013-12-02 MED ORDER — HEPARIN SOD (PORK) LOCK FLUSH 100 UNIT/ML IV SOLN
500.0000 [IU] | INTRAVENOUS | Status: AC | PRN
  Administered 2013-12-02: 500 [IU]

## 2013-12-02 MED ORDER — VITAMIN D 1000 UNITS PO TABS
4000.0000 [IU] | ORAL_TABLET | Freq: Every day | ORAL | Status: DC
  Administered 2013-12-02: 4000 [IU] via ORAL
  Filled 2013-12-02: qty 4

## 2013-12-02 MED ORDER — NITROGLYCERIN 2 % TD OINT
1.0000 [in_us] | TOPICAL_OINTMENT | Freq: Once | TRANSDERMAL | Status: AC
  Administered 2013-12-02: 1 [in_us] via TOPICAL
  Filled 2013-12-02: qty 1

## 2013-12-02 MED ORDER — BUPROPION HCL ER (XL) 150 MG PO TB24
300.0000 mg | ORAL_TABLET | Freq: Every day | ORAL | Status: DC
  Administered 2013-12-02: 300 mg via ORAL
  Filled 2013-12-02: qty 2

## 2013-12-02 MED ORDER — POLYVINYL ALCOHOL 1.4 % OP SOLN
1.0000 [drp] | OPHTHALMIC | Status: DC
  Administered 2013-12-02 (×4): 1 [drp] via OPHTHALMIC
  Filled 2013-12-02: qty 15

## 2013-12-02 MED ORDER — HEPARIN SODIUM (PORCINE) 5000 UNIT/ML IJ SOLN: 5000.0000 [IU] | Freq: Three times a day (TID) | INTRAMUSCULAR | Status: DC

## 2013-12-02 MED ORDER — CEFAZOLIN SODIUM-DEXTROSE 2-3 GM-% IV SOLR
2.0000 g | INTRAVENOUS | Status: DC
  Filled 2013-12-02: qty 50

## 2013-12-02 MED ORDER — POTASSIUM CHLORIDE CRYS ER 20 MEQ PO TBCR
40.0000 meq | EXTENDED_RELEASE_TABLET | Freq: Once | ORAL | Status: AC
  Administered 2013-12-02: 40 meq via ORAL
  Filled 2013-12-02: qty 2

## 2013-12-02 MED ORDER — TECHNETIUM TC 99M SESTAMIBI GENERIC - CARDIOLITE
10.0000 | Freq: Once | INTRAVENOUS | Status: AC | PRN
  Administered 2013-12-02: 10 via INTRAVENOUS

## 2013-12-02 MED ORDER — ACETAMINOPHEN 325 MG PO TABS: 650.0000 mg | ORAL_TABLET | ORAL | Status: DC | PRN

## 2013-12-02 NOTE — H&P (Signed)
PATIENT DETAILS Name: Nancy Norris Age: 73 y.o. Sex: female Date of Birth: February 24, 1940 Admit Date: 12/02/2013 ION:[id number] DAVID, MD   CHIEF COMPLAINT:  Chest pain  HPI: Nancy Norris is a 73 y.o. female with a Past Medical History of stage IV chronic kidney disease, multiple myeloma not on any active treatment, history of breast cancer, gastroesophageal reflux diseasewho presents today with the above noted complaint.she was recently diagnosed with diverticulitis, and started on oral ciprofloxacin and Flagyl added early this morning, she woke up with left sided chest pain, which she describes as "squeezing". She took nitroglycerin 2without any relief, since the pain was ongoing she called EMS. Patient was then brought to the ED for further evaluation, in the ED, she was started on transdermal nitroglycerin with complete resolution of her pain.she was seen by cardiology, plans are for a nuclear stress test later today. During my evaluation, she did not have any pain. Patient denied any associated shortness of breath, she did have some mild nausea but no vomiting.   ALLERGIES:   Allergies  Allergen Reactions  . Codeine Anaphylaxis  . Latex Shortness Of Breath    Adhesive products   . Other Other (See Comments)    Onion, chocolate causes migraines  . Adhesive [Tape] Other (See Comments)    blisters  . Iodinated Diagnostic Agents Itching    Happened 60 years ago  . Sulfa Antibiotics Itching    PAST MEDICAL HISTORY: Past Medical History  Diagnosis Date  . Hyperkalemia   . Hypothyroidism   . COPD (chronic obstructive pulmonary disease)   . Hyponatremia   . Dizziness   . Fibromyalgia   . Breast cancer   . Multiple myeloma   . Mucositis   . Hx of echocardiogram     a.  Echocardiogram (12/26/2012): EF 02-72%, grade 1 diastolic dysfunction;   b.  Echocardiogram (02/2013): EF 55-60%, no WMA, trivial effusion  . Fibromyalgia   . Arthritis   . Hx of  cardiovascular stress test     LexiScan with low level exercise Myoview (02/2013): No ischemia, EF 72%; normal study  . Complication of anesthesia   . PONV (postoperative nausea and vomiting) 2008    after mastestomy  . Myocardial infarction     in past, patient was unaware.   . Anginal pain     used NTG x 2 May 31 and 06/15/13   . GERD (gastroesophageal reflux disease)   . Headache(784.0)   . Anxiety   . Depression   . CKD (chronic kidney disease) stage 3, GFR 30-59 ml/min   . Anemia   . History of blood transfusion     last one May 12   . Neuropathy     PAST SURGICAL HISTORY: Past Surgical History  Procedure Laterality Date  . History of port removal    . Status post stem cell transplant on September 28, 2008.    Marland Kitchen Abdominal hysterectomy  1981  . Cholecystectomy  1971  . Mastectomy Left 2008  . Cataract extraction, bilateral    . Breast surgery    . Eye surgery Bilateral     lens implant  . Breast reconstruction    . Portacath placement  12/2012    has had 2  . Av fistula placement Left 06/19/2013    Procedure: CREATION OF LEFT ARM ARTERIOVENOUS (AV) FISTULA ;  Surgeon: Angelia Mould, MD;  Location: [facility];  Service: Vascular;  Laterality: Left;  MEDICATIONS AT HOME: Prior to Admission medications   Medication Sig Start Date End Date Taking? Authorizing Provider  acetaminophen (TYLENOL) 325 MG tablet Take 650 mg by mouth every 6 (six) hours as needed for mild pain or moderate pain.    Yes Historical Provider, MD  albuterol (PROVENTIL HFA;VENTOLIN HFA) 108 (90 BASE) MCG/ACT inhaler Inhale 2 puffs into the lungs 2 (two) times daily as needed for wheezing or shortness of breath. 06/04/13  Yes [name], MD  ascorbic acid (VITAMIN C) 250 MG CHEW Chew 250 mg by mouth daily.   Yes Historical Provider, MD  aspirin EC 81 MG tablet Take 1 tablet (81 mg total) by mouth daily. 01/29/13  Yes Liliane Shi, PA-C  buPROPion (WELLBUTRIN XL) 300 MG 24 hr tablet Take 1 tablet  each morning for Mood & Depression 11/02/13  Yes [name], MD  carboxymethylcellulose (REFRESH PLUS) 0.5 % SOLN Place 1 drop into both eyes every 2 (two) hours.   Yes Historical Provider, MD  cetirizine (ZYRTEC) 10 MG tablet Take 10 mg by mouth at bedtime.    Yes Historical Provider, MD  Cholecalciferol 4000 UNITS TABS Take 4,000 Units by mouth daily.   Yes Historical Provider, MD  ciprofloxacin (CIPRO) 500 MG tablet Take 1 tablet (500 mg total) by mouth daily with breakfast. 11/24/13  Yes Heather Laisure, PA-C  citalopram (CELEXA) 40 MG tablet TAKE 1 TABLET BY MOUTH ONCE DAILY. 11/03/13  Yes [name], MD  cromolyn (OPTICROM) 4 % ophthalmic solution Place 1 drop into both eyes 4 (four) times daily.   Yes Historical Provider, MD  cyanocobalamin 2000 MCG tablet Take 2,000 mcg by mouth daily.    Yes Historical Provider, MD  esomeprazole (NEXIUM) 40 MG capsule Take 1 capsule (40 mg total) by mouth daily. Each morning for acid indigestion 02/24/13 02/24/14 Yes [name], MD  Ferrous Sulfate 134 MG TABS Take 1 tablet by mouth every other day.    Yes Historical Provider, MD  fluticasone (FLONASE) 50 MCG/ACT nasal spray Place 2 sprays into both nostrils at bedtime.  03/17/13  Yes Historical Provider, MD  gabapentin (NEURONTIN) 300 MG capsule Take 1 capsule (300 mg total) by mouth at bedtime. 07/06/13  Yes [name], MD  isosorbide mononitrate (IMDUR) 30 MG 24 hr tablet Take 15 mg by mouth daily. Patient taking 15 mg, had a severe migraine with $RemoveBefor'30mg'jFAlkwjIogvv$  dose.   Yes Historical Provider, MD  levothyroxine (SYNTHROID, LEVOTHROID) 150 MCG tablet Take 150-225 mcg by mouth daily before breakfast. Takes 120mcg Mon-Fri Takes 238mcg Sat, Sun   Yes Historical Provider, MD  metroNIDAZOLE (FLAGYL) 500 MG tablet Take 1 tablet (500 mg total) by mouth 3 (three) times daily. 11/24/13  Yes Heather Laisure, PA-C  nitroGLYCERIN (NITROSTAT) 0.4 MG SL tablet Place 1 tablet (0.4 mg total) under the tongue every 5  (five) minutes as needed for chest pain. 01/29/13  Yes Scott Joylene Draft, PA-C  Olopatadine HCl (PAZEO) 0.7 % SOLN Place 1 drop into both eyes daily.   Yes Historical Provider, MD  ondansetron (ZOFRAN-ODT) 8 MG disintegrating tablet Take 1 tablet (8 mg total) by mouth every 8 (eight) hours as needed for nausea or vomiting. 01/27/13  Yes Adrena E Johnson, PA-C  prochlorperazine (COMPAZINE) 10 MG tablet TAKE 1 TABLET BY MOUTH EVERY 6 HOURS AS NEEDED FOR NAUSEA OR VOMITING. Patient taking differently: TAKE 1 TABLET BY MOUTH EVERY AT BEDTIME AS NEEDED FOR NAUSEA OR VOMITING. 09/29/13  Yes Curt Bears, MD  ranitidine (ZANTAC) 300 MG  tablet Take 1 tablet (300 mg total) by mouth at bedtime. For acid reflux and indigestion 02/24/13 02/24/14 Yes [name], MD  RESTASIS 0.05 % ophthalmic emulsion Take 1 drop by mouth 2 (two) times daily. 11/13/13  Yes Historical Provider, MD  sucralfate (CARAFATE) 1 G tablet Take 1 tablet (1 g total) by mouth 4 (four) times daily. Dissolve in water and swallow Patient taking differently: Take 1 g by mouth 3 (three) times daily with meals. Dissolve in water and swallow 02/24/13 02/24/14 Yes [name], MD  traMADol (ULTRAM) 50 MG tablet Take 1 tablet (50 mg total) by mouth every 6 (six) hours as needed. Patient taking differently: Take 50 mg by mouth every 6 (six) hours as needed for moderate pain.  11/24/13  Yes Heather Laisure, PA-C  zolpidem (AMBIEN) 5 MG tablet Take 5 mg by mouth at bedtime as needed for sleep.   Yes Historical Provider, MD    FAMILY HISTORY: Family History  Problem Relation Age of Onset  . Arthritis Mother   . Asthma Mother   . Cancer Sister   . Hyperlipidemia Brother     SOCIAL HISTORY:  reports that she quit smoking about 8 years ago. Her smoking use included Cigarettes. She has a 30 pack-year smoking history. She has never used smokeless tobacco. She reports that she does not drink alcohol or use illicit drugs.  REVIEW OF SYSTEMS:    Constitutional:   No  weight loss, night sweats,  Fevers, chills, fatigue.  HEENT:    No headaches, Difficulty swallowing,Tooth/dental problems,Sore throat  Cardio-vascular: No chest pain,  Orthopnea, PND, swelling in lower extremities, anasarca,  dizziness, palpitations  GI:  No  vomiting, diarrhea, change in  bowel habits, loss of appetite  Resp: No shortness of breath with exertion or at rest.  No excess mucus, no productive cough, No non-productive cough,  No coughing up of blood.No change in color of mucus.No wheezing.No chest wall deformity  Skin:  no rash or lesions.  GU:  no dysuria, change in color of urine, no urgency or frequency.  No flank pain.  Musculoskeletal: No joint pain or swelling.  No decreased range of motion.  No back pain.  Psych: No change in mood or affect. No depression or anxiety.  No memory loss.   PHYSICAL EXAM: Blood pressure 107/54, pulse 66, temperature 98.5 F (36.9 C), temperature source Oral, resp. rate 19, SpO2 99 %.  General appearance :Awake, alert, not in any distress. Speech Clear. Not toxic Looking HEENT: Atraumatic and Normocephalic, pupils equally reactive to light and accomodation Neck: supple, no JVD. No cervical lymphadenopathy.  Chest:Good air entry bilaterally, no added sounds  CVS: S1 S2 regular, no murmurs.  Abdomen: Bowel sounds present, very minimal tenderness in the left lower quadrant. Extremities: B/L Lower Ext shows no edema, both legs are warm to touch Neurology: Awake alert, and oriented X 3, CN II-XII intact, Non focal Skin:No Rash Wounds:N/A  LABS ON ADMISSION:   Recent Labs  12/02/13 0216  NA 142  K 3.5*  CL 108  CO2 18*  GLUCOSE 113*  BUN 25*  CREATININE 3.65*  CALCIUM 9.7   No results for input(s): AST, ALT, ALKPHOS, BILITOT, PROT, ALBUMIN in the last 72 hours. No results for input(s): LIPASE, AMYLASE in the last 72 hours.  Recent Labs  12/02/13 0216  WBC 8.3  NEUTROABS 5.5  HGB 10.6*   HCT 32.2*  MCV 100.6*  PLT 254    Recent Labs  12/02/13 0216  TROPONINI <0.30   No results for input(s): DDIMER in the last 72 hours. Invalid input(s): POCBNP   RADIOLOGIC STUDIES ON ADMISSION: Dg Chest 2 View  12/02/2013   CLINICAL DATA:  Left-sided chest pain  EXAM: CHEST  2 VIEW  COMPARISON:  01/13/2013  FINDINGS: Cardiac shadow is stable. Breast implants are noted bilaterally. A right chest wall port is seen with catheter tip at the cavoatrial junction. No focal infiltrate or sizable effusion is seen. No acute bony abnormality is noted.  IMPRESSION: Postoperative change without acute abnormality.   Electronically Signed   By: Inez Catalina M.D.   On: 12/02/2013 02:49     EKG: Independently reviewed. NSR  ASSESSMENT AND PLAN: Present on Admission:  . Chest pain:with a lot of typical factors for angina. Given stage IV chronic kidney disease, current recommendationsfrom cardiology are to repeat a nuclear stress test today.Further workup including cardiac catheterization will be considered depending on the results of the nuclear stress test.For now, admit to telemetry,cycle enzymes, continue aspirin, statin and nitroglycerin.  Marland Kitchen Hypothyroid:Continue with levothyroxine  . Multiple myeloma in remission:outpatient follow-up with Dr. Mckinley Jewel  . GERD (gastroesophageal reflux disease):continue PPI, Pepcid.  Marland Kitchen COPD (chronic obstructive pulmonary disease) with emphysema:stable, lungs clear.  . Chronic kidney disease, stage IV (severe):Creatinine currently at baseline. Has outpatient follow-up with nephrology.  . Chronic diastolic heart failure:Clinically compensated.  . Depression:Stable, continue with Wellbutrin and Celexa.  Marland Kitchen HTN (hypertension):Stable, continue with Imdur.  Further plan will depend as patient's clinical course evolves and further radiologic and laboratory data become available. Patient will be monitored closely.  Above noted plan was discussed with patient,  she was in agreement.   DVT Prophylaxis: Prophylactic  Heparin  Code Status: Full Code  Disposition Plan: Home when work up complete   Total time spent for admission equals 45 minutes.  [facility] Pager [phone]  If 7PM-7AM, please contact night-coverage [email or web address] Password [email or web address] 12/02/2013, 7:51 AM

## 2013-12-02 NOTE — Progress Notes (Signed)
The patient tolerated the lexiscan very well.  Interpretation to follow after stress images.  Nancy Pavek, pa-c

## 2013-12-02 NOTE — Progress Notes (Signed)
UR completed 

## 2013-12-02 NOTE — Discharge Summary (Signed)
PATIENT DETAILS Name: Nancy Norris Age: 73 y.o. Sex: female Date of Birth: July 15, 1940 MRN: [id number]. Admitting Physician: Berle Mull, MD MEQ:[id number] DAVID, MD  Admit Date: 12/02/2013 Discharge date: 12/02/2013  Recommendations for Outpatient Follow-up:  1. Please optimize GERD treatment 2. CBC/BMET at next visit  PRIMARY DISCHARGE DIAGNOSIS:  Principal Problem:   Chest pain Active Problems:   Multiple myeloma in remission   Acute on chronic renal failure   HX: breast cancer   Hypothyroid   Chronic diastolic heart failure   HTN (hypertension)   GERD (gastroesophageal reflux disease)   COPD (chronic obstructive pulmonary disease) with emphysema   Chronic kidney disease, stage IV (severe)   Depression   Multiple myeloma      PAST MEDICAL HISTORY: Past Medical History  Diagnosis Date  . Hyperkalemia   . Hypothyroidism   . COPD (chronic obstructive pulmonary disease)   . Hyponatremia   . Dizziness   . Fibromyalgia   . Breast cancer   . Multiple myeloma   . Mucositis   . Hx of echocardiogram     a.  Echocardiogram (12/26/2012): EF 92-11%, grade 1 diastolic dysfunction;   b.  Echocardiogram (02/2013): EF 55-60%, no WMA, trivial effusion  . Fibromyalgia   . Arthritis   . Hx of cardiovascular stress test     LexiScan with low level exercise Myoview (02/2013): No ischemia, EF 72%; normal study  . Complication of anesthesia   . PONV (postoperative nausea and vomiting) 2008    after mastestomy  . Myocardial infarction     in past, patient was unaware.   . Anginal pain     used NTG x 2 May 31 and 06/15/13   . GERD (gastroesophageal reflux disease)   . Headache(784.0)   . Anxiety   . Depression   . CKD (chronic kidney disease) stage 3, GFR 30-59 ml/min   . Anemia   . History of blood transfusion     last one May 12   . Neuropathy     DISCHARGE MEDICATIONS: Current Discharge Medication List    CONTINUE these medications which have NOT CHANGED   Details  acetaminophen (TYLENOL) 325 MG tablet Take 650 mg by mouth every 6 (six) hours as needed for mild pain or moderate pain.     albuterol (PROVENTIL HFA;VENTOLIN HFA) 108 (90 BASE) MCG/ACT inhaler Inhale 2 puffs into the lungs 2 (two) times daily as needed for wheezing or shortness of breath. Qty: 1 each, Refills: 6    ascorbic acid (VITAMIN C) 250 MG CHEW Chew 250 mg by mouth daily.    aspirin EC 81 MG tablet Take 1 tablet (81 mg total) by mouth daily.   Associated Diagnoses: Chest pain, unspecified    buPROPion (WELLBUTRIN XL) 300 MG 24 hr tablet Take 1 tablet each morning for Mood & Depression Qty: 30 tablet, Refills: 99    carboxymethylcellulose (REFRESH PLUS) 0.5 % SOLN Place 1 drop into both eyes every 2 (two) hours.    cetirizine (ZYRTEC) 10 MG tablet Take 10 mg by mouth at bedtime.    Associated Diagnoses: Multiple myeloma in remission    Cholecalciferol 4000 UNITS TABS Take 4,000 Units by mouth daily.    ciprofloxacin (CIPRO) 500 MG tablet Take 1 tablet (500 mg total) by mouth daily with breakfast. Qty: 20 tablet, Refills: 0    citalopram (CELEXA) 40 MG tablet TAKE 1 TABLET BY MOUTH ONCE DAILY. Qty: 90 tablet, Refills: 0    cromolyn (OPTICROM) 4 % ophthalmic  solution Place 1 drop into both eyes 4 (four) times daily.   Associated Diagnoses: Multiple myeloma in remission    cyanocobalamin 2000 MCG tablet Take 2,000 mcg by mouth daily.    Associated Diagnoses: Multiple myeloma in remission    esomeprazole (NEXIUM) 40 MG capsule Take 1 capsule (40 mg total) by mouth daily. Each morning for acid indigestion Qty: 30 capsule, Refills: 99   Associated Diagnoses: Reflux esophagitis    Ferrous Sulfate 134 MG TABS Take 1 tablet by mouth every other day.     fluticasone (FLONASE) 50 MCG/ACT nasal spray Place 2 sprays into both nostrils at bedtime.     gabapentin (NEURONTIN) 300 MG capsule Take 1 capsule (300 mg total) by mouth at bedtime. Qty: 90 capsule, Refills: 3      isosorbide mononitrate (IMDUR) 30 MG 24 hr tablet Take 15 mg by mouth daily. Patient taking 15 mg, had a severe migraine with $RemoveBefor'30mg'odspXeTQspFy$  dose.    levothyroxine (SYNTHROID, LEVOTHROID) 150 MCG tablet Take 150-225 mcg by mouth daily before breakfast. Takes 174mcg Mon-Fri Takes 251mcg Sat, Sun    metroNIDAZOLE (FLAGYL) 500 MG tablet Take 1 tablet (500 mg total) by mouth 3 (three) times daily. Qty: 30 tablet, Refills: 0    nitroGLYCERIN (NITROSTAT) 0.4 MG SL tablet Place 1 tablet (0.4 mg total) under the tongue every 5 (five) minutes as needed for chest pain. Qty: 25 tablet, Refills: 3   Associated Diagnoses: Chest pain, unspecified    Olopatadine HCl (PAZEO) 0.7 % SOLN Place 1 drop into both eyes daily.    ondansetron (ZOFRAN-ODT) 8 MG disintegrating tablet Take 1 tablet (8 mg total) by mouth every 8 (eight) hours as needed for nausea or vomiting. Qty: 20 tablet, Refills: 3    prochlorperazine (COMPAZINE) 10 MG tablet TAKE 1 TABLET BY MOUTH EVERY 6 HOURS AS NEEDED FOR NAUSEA OR VOMITING. Qty: 60 tablet, Refills: 0    ranitidine (ZANTAC) 300 MG tablet Take 1 tablet (300 mg total) by mouth at bedtime. For acid reflux and indigestion Qty: 30 tablet, Refills: 99   Associated Diagnoses: Reflux esophagitis    RESTASIS 0.05 % ophthalmic emulsion Take 1 drop by mouth 2 (two) times daily. Refills: 4    sucralfate (CARAFATE) 1 G tablet Take 1 tablet (1 g total) by mouth 4 (four) times daily. Dissolve in water and swallow Qty: 120 tablet, Refills: 99   Associated Diagnoses: Reflux esophagitis    traMADol (ULTRAM) 50 MG tablet Take 1 tablet (50 mg total) by mouth every 6 (six) hours as needed. Qty: 15 tablet, Refills: 0    zolpidem (AMBIEN) 5 MG tablet Take 5 mg by mouth at bedtime as needed for sleep.        ALLERGIES:   Allergies  Allergen Reactions  . Codeine Anaphylaxis  . Latex Shortness Of Breath    Adhesive products   . Other Other (See Comments)    Onion, chocolate causes  migraines  . Adhesive [Tape] Other (See Comments)    blisters  . Iodinated Diagnostic Agents Itching    Happened 60 years ago  . Sulfa Antibiotics Itching    BRIEF HPI:  See H&P, Labs, Consult and Test reports for all details in brief, patient  is a 73 y.o. female with a Past Medical History of stage IV chronic kidney disease, multiple myeloma not on any active treatment, history of breast cancer, gastroesophageal reflux diseasewho presented with chest pain.  CONSULTATIONS:   cardiology  PERTINENT RADIOLOGIC STUDIES: Ct Abdomen Pelvis Wo  Contrast  11/23/2013   CLINICAL DATA:  RIGHT lower quadrant pain worsening over 2 weeks. Constipation for 1 week. Rectal pressure. Dialysis patient.  EXAM: CT ABDOMEN AND PELVIS WITHOUT CONTRAST  TECHNIQUE: Multidetector CT imaging of the abdomen and pelvis was performed following the standard protocol without IV contrast. Oral contrast administered.  COMPARISON:  None.  FINDINGS: LUNG BASES: Included view of the lung bases are clear. Visualized heart appears mildly enlarged. Catheter an distal superior vena cava partially imaged. No pericardial fluid collections.  KIDNEYS/BLADDER: Kidneys are orthotopic, bilaterally mildly atrophic kidneys. No nephrolithiasis, hydronephrosis; limited assessment for renal masses on this nonenhanced examination. The unopacified ureters are normal in course and caliber. Urinary bladder is well distended and unremarkable.  SOLID ORGANS: The spleen, pancreas and RIGHT adrenal gland are unremarkable for this non-contrast examination. Mildly dense liver arm can be seen with amiodarone use. 13 mm LEFT adrenal adenoma (2 Hounsfield units). not visualize gallbladder, likely reflective of cholecystectomy.  GASTROINTESTINAL TRACT: Small hiatal hernia. Contrast reflection into the distal esophagus. The stomach, small and large bowel are normal in course and caliber without inflammatory changes, mild colonic diverticulosis with superimposed focal  mild inflammation within the rectosigmoid junction. The appendix is not discretely identified, however there are no inflammatory changes in the right lower quadrant. Mild amount of retained large bowel stool.  PERITONEUM/RETROPERITONEUM: No intraperitoneal free fluid nor free air. Aortoiliac vessels are normal in course and caliber, moderate calcific atherosclerosis. No lymphadenopathy by CT size criteria. Status post hysterectomy.  SOFT TISSUES/ OSSEOUS STRUCTURES: Nonsuspicious. LEFT breast implant partially imaged. Small fat containing umbilical hernia. Severe L5-S1 degenerative disc. Minimal grade 1 L4-5 anterolisthesis on degenerative basis. Mild broad dextroscoliosis on this nonweightbearing examination.  IMPRESSION: Mild rectosigmoid diverticulitis without complication. Mild amount of retained large bowel stool without obstruction.  Mildly atrophic kidneys without obstructive uropathy.   Electronically Signed   By: Elon Alas   On: 11/23/2013 23:48   Dg Chest 2 View  12/02/2013   CLINICAL DATA:  Left-sided chest pain  EXAM: CHEST  2 VIEW  COMPARISON:  01/13/2013  FINDINGS: Cardiac shadow is stable. Breast implants are noted bilaterally. A right chest wall port is seen with catheter tip at the cavoatrial junction. No focal infiltrate or sizable effusion is seen. No acute bony abnormality is noted.  IMPRESSION: Postoperative change without acute abnormality.   Electronically Signed   By: Inez Catalina M.D.   On: 12/02/2013 02:49   Nm Myocar Multi W/spect W/wall Motion / Ef  12/02/2013   CLINICAL DATA:  Chest pain.  EXAM: MYOCARDIAL IMAGING WITH SPECT (REST AND PHARMACOLOGIC-STRESS)  GATED LEFT VENTRICULAR WALL MOTION STUDY  LEFT VENTRICULAR EJECTION FRACTION  TECHNIQUE: Standard myocardial SPECT imaging was performed after resting intravenous injection of 10 mCi Tc-79m sestamibi. Subsequently, intravenous infusion of Lexiscan was performed under the supervision of the Cardiology staff. At peak  effect of the drug, 30 mCi Tc-69m sestamibi was injected intravenously and standard myocardial SPECT imaging was performed. Quantitative gated imaging was also performed to evaluate left ventricular wall motion, and estimate left ventricular ejection fraction.  COMPARISON:  None.  FINDINGS: Perfusion: No decreased activity in the left ventricle on stress imaging to suggest reversible ischemia or infarction.  Wall Motion: Normal left ventricular wall motion. No left ventricular dilation.  Left Ventricular Ejection Fraction: 75 %  End diastolic volume 66 ml  End systolic volume 16 ml  IMPRESSION: 1. No reversible ischemia or infarction.  2. Normal left ventricular wall motion.  3.  Left ventricular ejection fraction 75%  4. Low-risk stress test findings*.  *2012 Appropriate Use Criteria for Coronary Revascularization Focused Update: J Am Coll Cardiol. 5631;49(7):026-378. http://content.[email or web address].aspx?articleid=[email or web address]   Electronically Signed   By: Kalman Jewels M.D.   On: 12/02/2013 15:01     PERTINENT LAB RESULTS: CBC:  Recent Labs  12/02/13 0216 12/02/13 1144  WBC 8.3 8.9  HGB 10.6* 11.0*  HCT 32.2* 34.1*  PLT 254 291   CMET CMP     Component Value Date/Time   NA 142 12/02/2013 0216   NA 139 10/06/2013 0819   K 3.5* 12/02/2013 0216   K 3.4* 10/06/2013 0819   CL 108 12/02/2013 0216   CL 105 03/19/2012 0811   CO2 18* 12/02/2013 0216   CO2 21* 10/06/2013 0819   GLUCOSE 113* 12/02/2013 0216   GLUCOSE 105 10/06/2013 0819   GLUCOSE 94 03/19/2012 0811   BUN 25* 12/02/2013 0216   BUN 29.0* 10/06/2013 0819   CREATININE 3.59* 12/02/2013 1144   CREATININE 3.8* 10/06/2013 0819   CREATININE 3.77* 09/30/2013 0950   CALCIUM 9.7 12/02/2013 0216   CALCIUM 9.4 10/06/2013 0819   PROT 6.5 11/23/2013 1914   PROT 6.5 10/06/2013 0819   ALBUMIN 3.4* 11/23/2013 1914   ALBUMIN 3.7 10/06/2013 0819   AST 14 11/23/2013 1914   AST 20 10/06/2013 0819   ALT 14 11/23/2013 1914   ALT 13  10/06/2013 0819   ALKPHOS 107 11/23/2013 1914   ALKPHOS 118 10/06/2013 0819   BILITOT 0.2* 11/23/2013 1914   BILITOT 0.48 10/06/2013 0819   GFRNONAA 12* 12/02/2013 1144   GFRNONAA 11* 09/30/2013 0950   GFRAA 14* 12/02/2013 1144   GFRAA 13* 09/30/2013 0950    GFR CrCl cannot be calculated (Unknown ideal weight.). No results for input(s): LIPASE, AMYLASE in the last 72 hours.  Recent Labs  12/02/13 0216 12/02/13 1144 12/02/13 1440  TROPONINI <0.30 <0.30 <0.30   Invalid input(s): POCBNP No results for input(s): DDIMER in the last 72 hours. No results for input(s): HGBA1C in the last 72 hours. No results for input(s): CHOL, HDL, LDLCALC, TRIG, CHOLHDL, LDLDIRECT in the last 72 hours. No results for input(s): TSH, T4TOTAL, T3FREE, THYROIDAB in the last 72 hours.  Invalid input(s): FREET3 No results for input(s): VITAMINB12, FOLATE, FERRITIN, TIBC, IRON, RETICCTPCT in the last 72 hours. Coags: No results for input(s): INR in the last 72 hours.  Invalid input(s): PT Microbiology: No results found for this or any previous visit (from the past 240 hour(s)).   BRIEF HOSPITAL COURSE:   Principal Problem: Chest pain:with a lot of typical factors for angina. Cards consulted, given stage IV chronic kidney disease,  Recommendations from cardiology were to repeat a nuclear stress test today.This was completed, and it was negative for ischemia. Spoke with cards PA-C Tarri Fuller over the phone, no further recommendations, ok to discharge. Cardiac enzymes were negative, remains chest pain free since admission. Suspect in retrospect that pain may be related to GERD.   Marland Kitchen Hypothyroid:Continue with levothyroxine  . Multiple myeloma in remission:outpatient follow-up with Dr. Mckinley Jewel  . GERD (gastroesophageal reflux disease):continue PPI, Pepcid and carafate. I have advised lifestyle modifications.   Marland Kitchen COPD (chronic obstructive pulmonary disease) with emphysema:stable, lungs clear.  .  Chronic kidney disease, stage IV (severe):Creatinine currently at baseline. Has outpatient follow-up with nephrology.  . Chronic diastolic heart failure:Clinically compensated.  . Depression:Stable, continue with Wellbutrin and Celexa.  Marland Kitchen HTN (hypertension):Stable, continue with Imdur.   TODAY-DAY OF DISCHARGE:  Subjective:  Nettye Flegal today has no headache,no chest abdominal pain,no new weakness tingling or numbness, feels much better wants to go home today.   Objective:   Blood pressure 103/59, pulse 68, temperature 97.7 F (36.5 C), temperature source Oral, resp. rate 18, SpO2 100 %. No intake or output data in the 24 hours ending 12/02/13 1658 There were no vitals filed for this visit.  Exam Awake Alert, Oriented *3, No new F.N deficits, Normal affect [place].AT,PERRAL Supple Neck,No JVD, No cervical lymphadenopathy appriciated.  Symmetrical Chest wall movement, Good air movement bilaterally, CTAB RRR,No Gallops,Rubs or new Murmurs, No Parasternal Heave +ve B.Sounds, Abd Soft, Non tender, No organomegaly appriciated, No rebound -guarding or rigidity. No Cyanosis, Clubbing or edema, No new Rash or bruise  DISCHARGE CONDITION: Stable  DISPOSITION: Home  DISCHARGE INSTRUCTIONS:    Activity:  As tolerated   Diet recommendation: Diabetic Diet  Discharge Instructions    Call MD for:  difficulty breathing, headache or visual disturbances    Complete by:  As directed      Call MD for:    Complete by:  As directed   Chest pain     Diet - low sodium heart healthy    Complete by:  As directed      Increase activity slowly    Complete by:  As directed            Follow-up Information    Follow up with [name], MD. Schedule an appointment as soon as possible for a visit in 1 week.   Specialty:  Internal Medicine   Contact information:   [address] [place] [address] [phone]       Total Time spent on discharge equals  45 minutes.  SignedOren Binet 12/02/2013 4:58 PM

## 2013-12-02 NOTE — Progress Notes (Signed)
Nancy Norris  [birth year] [id number]  Patient Care Team: [name], MD as PCP - General (Internal Medicine) [name], MD (Internal Medicine) Donato Heinz, MD as Referring Physician (Nephrology) Curt Bears, MD as Consulting Physician (Oncology) Wonda Horner, MD as Consulting Physician (Gastroenterology) Fabio Pierce, MD as Consulting Physician (Ophthalmology)  This patient is a 73 y.o.female   Called by admitting service.  Patient seen by me in late September 10/13/2013.    Sent by Dr. Markus Jarvis with nephrology to consider peritoneal dialysis catheter placement since it was felt she was going to proceed to being dialysis dependent. Office consultation done by me.  Discussed with the patient.  Orders placed.  Patient called back 10/19/2013 and canceled, saying she did not want to have it done.  Patient now admitted.  We'll defer to nephrology when they feel if the patient wishes to reconsider this & isd appropriate.  Obviously not appropriate at this time with current issues this admission especially cardiac.  Would not consider surgery until cleared by cardiology and no evidence of diverticulitis or other intra-abdominal problems.  Patient Active Problem List   Diagnosis Date Noted  . Chest pain 12/02/2013  . Multiple myeloma 12/02/2013  . Depression 11/02/2013  . Encounter for long-term (current) use of other medications 09/30/2013  . Chronic kidney disease, stage IV (severe) 06/10/2013  . Vitamin D Deficiency 03/24/2013  . Mixed hyperlipidemia 02/24/2013  . Other abnormal glucose 02/24/2013  . Fibromyalgia   . COPD (chronic obstructive pulmonary disease) with emphysema 02/20/2013  . CKD (chronic kidney disease) stage 3, GFR 30-59 ml/min 01/29/2013  . GERD (gastroesophageal reflux disease) 01/20/2013  . HTN (hypertension) 01/14/2013  . Anemia 01/13/2013  . Sleep disturbance 01/13/2013  . Asthma, chronic 01/13/2013  . Hypothyroid 01/13/2013  . Chronic  diastolic heart failure 99/83/3825  . Multiple myeloma, in relapse 12/25/2012  . HX: breast cancer 12/08/2012  . Acute on chronic renal failure 12/02/2012  . Hypercalcemia 12/02/2012  . Multiple myeloma in remission 09/26/2011    Past Medical History  Diagnosis Date  . Hyperkalemia   . Hypothyroidism   . COPD (chronic obstructive pulmonary disease)   . Hyponatremia   . Dizziness   . Fibromyalgia   . Breast cancer   . Multiple myeloma   . Mucositis   . Hx of echocardiogram     a.  Echocardiogram (12/26/2012): EF 05-39%, grade 1 diastolic dysfunction;   b.  Echocardiogram (02/2013): EF 55-60%, no WMA, trivial effusion  . Fibromyalgia   . Arthritis   . Hx of cardiovascular stress test     LexiScan with low level exercise Myoview (02/2013): No ischemia, EF 72%; normal study  . Complication of anesthesia   . PONV (postoperative nausea and vomiting) 2008    after mastestomy  . Myocardial infarction     in past, patient was unaware.   . Anginal pain     used NTG x 2 May 31 and 06/15/13   . GERD (gastroesophageal reflux disease)   . Headache(784.0)   . Anxiety   . Depression   . CKD (chronic kidney disease) stage 3, GFR 30-59 ml/min   . Anemia   . History of blood transfusion     last one May 12   . Neuropathy     Past Surgical History  Procedure Laterality Date  . History of port removal    . Status post stem cell transplant on September 28, 2008.    Marland Kitchen Abdominal hysterectomy  1981  .  Cholecystectomy  1971  . Mastectomy Left 2008  . Cataract extraction, bilateral    . Breast surgery    . Eye surgery Bilateral     lens implant  . Breast reconstruction    . Portacath placement  12/2012    has had 2  . Av fistula placement Left 06/19/2013    Procedure: CREATION OF LEFT ARM ARTERIOVENOUS (AV) FISTULA ;  Surgeon: Angelia Mould, MD;  Location: [place];  Service: Vascular;  Laterality: Left;    History   Social History  . Marital Status: Single    Spouse Name: N/A     Number of Children: 1  . Years of Education: N/A   Occupational History  . retired-sect.Licensed conveyancer)    Social History Main Topics  . Smoking status: Former Smoker -- 1.00 packs/day for 30 years    Types: Cigarettes    Quit date: 02/15/2005  . Smokeless tobacco: Never Used  . Alcohol Use: No  . Drug Use: No  . Sexual Activity: No   Other Topics Concern  . Not on file   Social History Narrative    Family History  Problem Relation Age of Onset  . Arthritis Mother   . Asthma Mother   . Cancer Sister   . Hyperlipidemia Brother     Current Facility-Administered Medications  Medication Dose Route Frequency Provider Last Rate Last Dose  . acetaminophen (TYLENOL) tablet 650 mg  650 mg Oral Q4H PRN Jonetta Osgood, MD      . albuterol (PROVENTIL) (2.5 MG/3ML) 0.083% nebulizer solution 3 mL  3 mL Inhalation BID PRN Jonetta Osgood, MD      . aspirin EC tablet 81 mg  81 mg Oral Daily Bryan W Hager, PA-C      . atorvastatin (LIPITOR) tablet 40 mg  40 mg Oral q1800 Einar Pheasant Hager, PA-C      . buPROPion (WELLBUTRIN XL) 24 hr tablet 300 mg  300 mg Oral Daily Shanker Kristeen Mans, MD      . cholecalciferol (VITAMIN D) tablet 4,000 Units  4,000 Units Oral Daily Shanker Kristeen Mans, MD      . ciprofloxacin (CIPRO) tablet 500 mg  500 mg Oral Q breakfast Shanker Kristeen Mans, MD      . citalopram (CELEXA) tablet 40 mg  40 mg Oral Daily Shanker Kristeen Mans, MD      . cromolyn (OPTICROM) 4 % ophthalmic solution 1 drop  1 drop Both Eyes QID Shanker Kristeen Mans, MD      . cycloSPORINE (RESTASIS) 0.05 % ophthalmic emulsion 1 drop  1 drop Both Eyes BID Shanker Kristeen Mans, MD      . famotidine (PEPCID) tablet 20 mg  20 mg Oral Daily Shanker Kristeen Mans, MD      . Derrill Memo ON 12/03/2013] ferrous sulfate (FER-IN-SOL) 75 (15 FE) MG/ML solution 134 mg  134 mg Oral QODAY Shanker Kristeen Mans, MD      . fluticasone (FLONASE) 50 MCG/ACT nasal spray 2 spray  2 spray Each Nare QHS Shanker Kristeen Mans, MD      . gabapentin  (NEURONTIN) capsule 300 mg  300 mg Oral QHS Shanker Kristeen Mans, MD      . heparin injection 5,000 Units  5,000 Units Subcutaneous 3 times per day Jonetta Osgood, MD      . isosorbide mononitrate (IMDUR) 24 hr tablet 15 mg  15 mg Oral Daily Shanker Kristeen Mans, MD      . levothyroxine (SYNTHROID,  LEVOTHROID) tablet 150 mcg  150 mcg Oral Once per day on Mon Tue Wed Thu Fri Jonetta Osgood, MD      . Derrill Memo ON 12/05/2013] levothyroxine (SYNTHROID, LEVOTHROID) tablet 225 mcg  225 mcg Oral Once per day on Sun Sat Jonetta Osgood, MD      . loratadine (CLARITIN) tablet 10 mg  10 mg Oral Daily Shanker Kristeen Mans, MD      . metroNIDAZOLE (FLAGYL) tablet 500 mg  500 mg Oral TID Jonetta Osgood, MD      . olopatadine (PATANOL) 0.1 % ophthalmic solution 1 drop  1 drop Both Eyes BID Jonetta Osgood, MD      . ondansetron [facility]) injection 4 mg  4 mg Intravenous Q6H PRN Brett Canales, PA-C   4 mg at 12/02/13 9233  . pantoprazole (PROTONIX) EC tablet 40 mg  40 mg Oral Daily Shanker Kristeen Mans, MD      . polyvinyl alcohol (LIQUIFILM TEARS) 1.4 % ophthalmic solution 1 drop  1 drop Both Eyes Q2H Shanker Kristeen Mans, MD      . sucralfate (CARAFATE) 1 GM/10ML suspension 1 g  1 g Oral QID Shanker Kristeen Mans, MD      . traMADol Veatrice Bourbon) tablet 50 mg  50 mg Oral Q6H PRN Shanker Kristeen Mans, MD      . vitamin B-12 (CYANOCOBALAMIN) tablet 2,000 mcg  2,000 mcg Oral Daily Shanker Kristeen Mans, MD      . vitamin C (ASCORBIC ACID) tablet 250 mg  250 mg Oral Daily Shanker Kristeen Mans, MD      . zolpidem (AMBIEN) tablet 5 mg  5 mg Oral QHS PRN Shanker Kristeen Mans, MD       Facility-Administered Medications Ordered in Other Encounters  Medication Dose Route Frequency Provider Last Rate Last Dose  . 0.9 %  sodium chloride infusion   Intravenous Once Adrena E Johnson, PA-C      . sodium chloride 0.9 % injection 10 mL  10 mL Intracatheter PRN Curt Bears, MD   10 mL at 01/05/13 1825     Allergies  Allergen Reactions  .  Codeine Anaphylaxis  . Latex Shortness Of Breath    Adhesive products   . Other Other (See Comments)    Onion, chocolate causes migraines  . Adhesive [Tape] Other (See Comments)    blisters  . Iodinated Diagnostic Agents Itching    Happened 60 years ago  . Sulfa Antibiotics Itching    BP 113/75 mmHg  Pulse 62  Temp(Src) 98.5 F (36.9 C) (Oral)  Resp 20  SpO2 99%  Ct Abdomen Pelvis Wo Contrast  11/23/2013   CLINICAL DATA:  RIGHT lower quadrant pain worsening over 2 weeks. Constipation for 1 week. Rectal pressure. Dialysis patient.  EXAM: CT ABDOMEN AND PELVIS WITHOUT CONTRAST  TECHNIQUE: Multidetector CT imaging of the abdomen and pelvis was performed following the standard protocol without IV contrast. Oral contrast administered.  COMPARISON:  None.  FINDINGS: LUNG BASES: Included view of the lung bases are clear. Visualized heart appears mildly enlarged. Catheter an distal superior vena cava partially imaged. No pericardial fluid collections.  KIDNEYS/BLADDER: Kidneys are orthotopic, bilaterally mildly atrophic kidneys. No nephrolithiasis, hydronephrosis; limited assessment for renal masses on this nonenhanced examination. The unopacified ureters are normal in course and caliber. Urinary bladder is well distended and unremarkable.  SOLID ORGANS: The spleen, pancreas and RIGHT adrenal gland are unremarkable for this non-contrast examination. Mildly dense liver arm can be seen with  amiodarone use. 13 mm LEFT adrenal adenoma (2 Hounsfield units). not visualize gallbladder, likely reflective of cholecystectomy.  GASTROINTESTINAL TRACT: Small hiatal hernia. Contrast reflection into the distal esophagus. The stomach, small and large bowel are normal in course and caliber without inflammatory changes, mild colonic diverticulosis with superimposed focal mild inflammation within the rectosigmoid junction. The appendix is not discretely identified, however there are no inflammatory changes in the right  lower quadrant. Mild amount of retained large bowel stool.  PERITONEUM/RETROPERITONEUM: No intraperitoneal free fluid nor free air. Aortoiliac vessels are normal in course and caliber, moderate calcific atherosclerosis. No lymphadenopathy by CT size criteria. Status post hysterectomy.  SOFT TISSUES/ OSSEOUS STRUCTURES: Nonsuspicious. LEFT breast implant partially imaged. Small fat containing umbilical hernia. Severe L5-S1 degenerative disc. Minimal grade 1 L4-5 anterolisthesis on degenerative basis. Mild broad dextroscoliosis on this nonweightbearing examination.  IMPRESSION: Mild rectosigmoid diverticulitis without complication. Mild amount of retained large bowel stool without obstruction.  Mildly atrophic kidneys without obstructive uropathy.   Electronically Signed   By: Elon Alas   On: 11/23/2013 23:48   Dg Chest 2 View  12/02/2013   CLINICAL DATA:  Left-sided chest pain  EXAM: CHEST  2 VIEW  COMPARISON:  01/13/2013  FINDINGS: Cardiac shadow is stable. Breast implants are noted bilaterally. A right chest wall port is seen with catheter tip at the cavoatrial junction. No focal infiltrate or sizable effusion is seen. No acute bony abnormality is noted.  IMPRESSION: Postoperative change without acute abnormality.   Electronically Signed   By: Inez Catalina M.D.   On: 12/02/2013 02:49    Note: This dictation was prepared with Dragon/digital dictation along with [employer]. Any transcriptional errors that result from this process are unintentional.

## 2013-12-02 NOTE — Progress Notes (Addendum)
    Subjective: No further CP since Nitro paste applied.  Feels nauseated.   Objective: Vital signs in last 24 hours: Temp:  [98.5 F (36.9 C)] 98.5 F (36.9 C) (11/18 0159) Pulse Rate:  [59-68] 66 (11/18 0600) Resp:  [10-19] 19 (11/18 0600) BP: (95-125)/(44-67) 107/54 mmHg (11/18 0615) SpO2:  [94 %-100 %] 99 % (11/18 0615)    Intake/Output from previous day:   Intake/Output this shift:    Medications Current Facility-Administered Medications  Medication Dose Route Frequency Provider Last Rate Last Dose  . ceFAZolin (ANCEF) IVPB 2 g/50 mL premix  2 g Intravenous On Call to OR Michael Boston, MD      . chlorhexidine (HIBICLENS) 4 % liquid 1 application  1 application Topical Once Michael Boston, MD      . Derrill Memo ON 12/03/2013] chlorhexidine (HIBICLENS) 4 % liquid 1 application  1 application Topical Once Michael Boston, MD       Facility-Administered Medications Ordered in Other Encounters  Medication Dose Route Frequency Provider Last Rate Last Dose  . 0.9 %  sodium chloride infusion   Intravenous Once Adrena E Johnson, PA-C      . sodium chloride 0.9 % injection 10 mL  10 mL Intracatheter PRN Curt Bears, MD   10 mL at 01/05/13 1825    PE: General appearance: alert, cooperative and no distress Lungs: clear to auscultation bilaterally Heart: regular rate and rhythm, S1, S2 normal, no murmur, click, rub or gallop Extremities: No LEE Pulses: 2+ and symmetric Skin: Warm and dry Neurologic: Grossly normal  Lab Results:   Recent Labs  12/02/13 0216  WBC 8.3  HGB 10.6*  HCT 32.2*  PLT 254   BMET  Recent Labs  12/02/13 0216  NA 142  K 3.5*  CL 108  CO2 18*  GLUCOSE 113*  BUN 25*  CREATININE 3.65*  CALCIUM 9.7   Lipid Panel     Component Value Date/Time   CHOL 190 09/30/2013 0950   TRIG 169* 09/30/2013 0950   HDL 43 09/30/2013 0950   CHOLHDL 4.4 09/30/2013 0950   VLDL 34 09/30/2013 0950   LDLCALC 113* 09/30/2013 0950       Assessment/Plan 76F  with recurrent multiple myeloma not currently on therapy, COPD, CKD4 and diverticulitis who presented to the ED with an episode of CP    Chest pain Negative troponin times one.  Lexiscan cardiolite today.  Zofran for nausea.  ASA, lipitor.   BP will not tolerate BB.      Hypokalemia  Replace    Multiple myeloma in remission   Acute on chronic renal failure   HX: breast cancer   Hypothyroid   Chronic diastolic heart failure   HTN (hypertension)  Stable.    GERD (gastroesophageal reflux disease)   COPD (chronic obstructive pulmonary disease) with emphysema   Chronic kidney disease, stage IV (severe)   Depression   Multiple myeloma     LOS: 0 days    HAGER, BRYAN PA-C 12/02/2013 7:54 AM  Agree with above assessment. Not having any chest pain now. Back from myoview.  Results pending. Lungs clear. Heart no gallop.

## 2013-12-02 NOTE — Consult Note (Signed)
Referring Physician: Thayer Jew, MD Reason for Consultation: Chest pain   HPI:  Nancy Norris is a 85F with recurrent multiple myeloma not currently on therapy, COPD, CKD4 and diverticulitis who presented to the ED with an episode of CP.  The CP awoke her from sleep and felt like a crushing sensation.  Initially she thought it was her hiatal hernia, but it did not improve upon sitting up.  The CP was associated with nausea and SOB.  It did not radiate and there was no diaphoresis.  Nancy Norris tried taking 2 nitroglyerin, which did not relieve the pain.  She got up to get dressed after calling EMS and the exertion worsened both the CP and her SOB.  The pain was slightly better when leaning forward.    Upon arrival in the ED Nancy Norris's BP was 105, HR 65.  Her EKG did not have any ischemic changes.  Nitro paste was applied and her CP immediately subsided.  Her first set of cardiac enzymes was negative.  Nancy Norris was previously on chemotherapy for Multiple Myeloma.  Her last treatment was in April.  She is concerned about taking any more chemo due to her CKD 4.  Prior to starting chemotherapy she had a nuclear stress test (02/23/13) which was negative for inducible ischemia.  Review of Systems:     Cardiac Review of Systems: {Y] = yes $Rem'[ ]'xlPY$  = no  Chest Pain [  x  ]  Resting SOB [ x  ] Exertional SOB  [x  ]  Orthopnea [  ]   Pedal Edema [   ]    Palpitations [  ] Syncope  [  ]   Presyncope [   ]  General Review of Systems: [Y] = yes [  ]=no Constitional: recent weight change [  ]; anorexia [  ]; fatigue [  ]; nausea [  ]; night sweats [  ]; fever [  ]; or chills [  ];                                                                     Eyes : blurred vision [  ]; diplopia [   ]; vision changes [  ];  Amaurosis fugax[  ]; Resp: cough [  ];  wheezing[  ];  hemoptysis[  ];  PND [  ];  GI:  gallstones[  ], vomiting[  ];  dysphagia[  ]; melena[  ];  hematochezia [  ]; heartburn[  ];   GU: kidney  stones [  ]; hematuria[  ];   dysuria [  ];  nocturia[  ]; incontinence [  ];             Skin: rash, swelling[  ];, hair loss[  ];  peripheral edema[  ];  or itching[  ]; Musculosketetal: myalgias[  ];  joint swelling[  ];  joint erythema[  ];  joint pain[  ];  back pain[  ];  Heme/Lymph: bruising[  ];  bleeding[  ];  anemia[  ];  Neuro: TIA[  ];  headaches[  ];  stroke[  ];  vertigo[  ];  seizures[  ];   paresthesias[  ];  difficulty walking[  ];  Psych:depression[  ]; anxiety[  ];  Endocrine: diabetes[  ];  thyroid dysfunction[  ];  Other:  Past Medical History  Diagnosis Date  . Hyperkalemia   . Hypothyroidism   . COPD (chronic obstructive pulmonary disease)   . Hyponatremia   . Dizziness   . Fibromyalgia   . Breast cancer   . Multiple myeloma   . Mucositis   . Hx of echocardiogram     a.  Echocardiogram (12/26/2012): EF 62-69%, grade 1 diastolic dysfunction;   b.  Echocardiogram (02/2013): EF 55-60%, no WMA, trivial effusion  . Fibromyalgia   . Arthritis   . Hx of cardiovascular stress test     LexiScan with low level exercise Myoview (02/2013): No ischemia, EF 72%; normal study  . Complication of anesthesia   . PONV (postoperative nausea and vomiting) 2008    after mastestomy  . Myocardial infarction     in past, patient was unaware.   . Anginal pain     used NTG x 2 May 31 and 06/15/13   . GERD (gastroesophageal reflux disease)   . Headache(784.0)   . Anxiety   . Depression   . CKD (chronic kidney disease) stage 3, GFR 30-59 ml/min   . Anemia   . History of blood transfusion     last one May 12   . Neuropathy      (Not in a hospital admission)   . traMADol  50 mg Oral Once    Infusions:    Allergies  Allergen Reactions  . Codeine Anaphylaxis  . Latex Shortness Of Breath    Adhesive products   . Other Other (See Comments)    Onion, chocolate causes migraines  . Adhesive [Tape] Other (See Comments)    blisters  . Iodinated Diagnostic Agents Itching     Happened 60 years ago  . Sulfa Antibiotics Itching    History   Social History  . Marital Status: Single    Spouse Name: N/A    Number of Children: 1  . Years of Education: N/A   Occupational History  . retired-sect.Licensed conveyancer)    Social History Main Topics  . Smoking status: Former Smoker -- 1.00 packs/day for 30 years    Types: Cigarettes    Quit date: 02/15/2005  . Smokeless tobacco: Never Used  . Alcohol Use: No  . Drug Use: No  . Sexual Activity: No   Other Topics Concern  . Not on file   Social History Narrative    Family History  Problem Relation Age of Onset  . Arthritis Mother   . Asthma Mother   . Cancer Sister   . Hyperlipidemia Brother     PHYSICAL EXAM: Filed Vitals:   12/02/13 0415  BP: 109/55  Pulse: 65  Temp:   Resp: 11    No intake or output data in the 24 hours ending 12/02/13 0552  General:  Well appearing. No respiratory difficulty HEENT: normal Neck: Norris. no JVD. Carotids 2+ bilat; no bruits. No lymphadenopathy or thryomegaly appreciated. Cor: PMI nondisplaced. Regular rate & rhythm. No rubs, gallops or murmurs. Lungs: clear Abdomen: soft, nontender, nondistended. No hepatosplenomegaly. No bruits or masses. Good bowel sounds. Extremities: no cyanosis, clubbing, rash, edema Neuro: alert & oriented x 3, cranial nerves grossly intact. moves all 4 extremities w/o difficulty. Affect pleasant.  ECG: Sinus rhythm at 66 bpm  Results for orders placed or performed during the hospital encounter of 12/02/13 (from the past 24 hour(s))  CBC with  Differential     Status: Abnormal   Collection Time: 12/02/13  2:16 AM  Result Value Ref Range   WBC 8.3 4.0 - 10.5 K/uL   RBC 3.20 (L) 3.87 - 5.11 MIL/uL   Hemoglobin 10.6 (L) 12.0 - 15.0 g/dL   HCT 32.2 (L) 36.0 - 46.0 %   MCV 100.6 (H) 78.0 - 100.0 fL   MCH 33.1 26.0 - 34.0 pg   MCHC 32.9 30.0 - 36.0 g/dL   RDW 14.4 11.5 - 15.5 %   Platelets 254 150 - 400 K/uL   Neutrophils Relative % 68  43 - 77 %   Neutro Abs 5.5 1.7 - 7.7 K/uL   Lymphocytes Relative 19 12 - 46 %   Lymphs Abs 1.6 0.7 - 4.0 K/uL   Monocytes Relative 10 3 - 12 %   Monocytes Absolute 0.9 0.1 - 1.0 K/uL   Eosinophils Relative 3 0 - 5 %   Eosinophils Absolute 0.3 0.0 - 0.7 K/uL   Basophils Relative 0 0 - 1 %   Basophils Absolute 0.0 0.0 - 0.1 K/uL  Basic metabolic panel     Status: Abnormal   Collection Time: 12/02/13  2:16 AM  Result Value Ref Range   Sodium 142 137 - 147 mEq/L   Potassium 3.5 (L) 3.7 - 5.3 mEq/L   Chloride 108 96 - 112 mEq/L   CO2 18 (L) 19 - 32 mEq/L   Glucose, Bld 113 (H) 70 - 99 mg/dL   BUN 25 (H) 6 - 23 mg/dL   Creatinine, Ser 3.65 (H) 0.50 - 1.10 mg/dL   Calcium 9.7 8.4 - 10.5 mg/dL   GFR calc non Af Amer 11 (L) >90 mL/min   GFR calc Af Amer 13 (L) >90 mL/min   Anion gap 16 (H) 5 - 15  Troponin I     Status: None   Collection Time: 12/02/13  2:16 AM  Result Value Ref Range   Troponin I <0.30 <0.30 ng/mL   Dg Chest 2 View  12/02/2013   CLINICAL DATA:  Left-sided chest pain  EXAM: CHEST  2 VIEW  COMPARISON:  01/13/2013  FINDINGS: Cardiac shadow is stable. Breast implants are noted bilaterally. A right chest wall port is seen with catheter tip at the cavoatrial junction. No focal infiltrate or sizable effusion is seen. No acute bony abnormality is noted.  IMPRESSION: Postoperative change without acute abnormality.   Electronically Signed   By: Inez Catalina M.D.   On: 12/02/2013 02:49     ASSESSMENT:  16F with recurrent multiple myeloma not currently on therapy, COPD, CKD4 and diverticulitis who presented to the ED with an episode of CP.   PLAN/DISCUSSION:  # CP: Nancy Norris's chest pain is concerning for angina because it was exertional, substernal CP and alleviated with NTG.  She also has risk factors for CAD (HTN, former heavy tobacco use, CKD) and was diagnosed with a silent MI, though there are no Q waves on EKG and no rest/stress perfusion defects in nuclear stress test  last year.  Given her chronic kidney disease, catheterization should only be considered if she has a markedly abnormal stress or positive cardiac enzymes.   - No ACE-I/ARB given renal function - cycle cardiac enzymes - repeat Nuclear stress test  - BP currently too low for beta blocker - ASA $Rem'81mg'RPal$  daily - Atorvastatin $RemoveBefore'40mg'[id number]$  daily - Will re-evaluate need for ASA and statin after stress - Would favor adding a beta blocker over her home Imdur (  given the mortality benefit) if her BP can tolerate it

## 2013-12-02 NOTE — ED Provider Notes (Signed)
CSN: [id number]     Arrival date & time 12/02/13  0145 History   First MD Initiated Contact with Patient 12/02/13 0153     Chief Complaint  Patient presents with  . Chest Pain     (Consider location/radiation/quality/duration/timing/severity/associated sxs/prior Treatment) HPI  This is a 73 year old female with a history of multiple myeloma, breast cancer, chronic kidney disease who presents with chest pain. Patient reports onset of sternal "crushing chest pain" that woke her up from sleep at approximately 12:30 AM. Initially she thought it was her hiatal hernia but that the pain was not consistent with her hiatal hernia. She took 2 nitroglycerin and a full dose aspirin prior to EMS arrival. Nitroglycerin did not help.  Patient reports that she got nursed nervous. She did have associated nausea. No vomiting, abdominal pain, or diarrhea. Currently pain is 5 out of 10.  Patient is followed by cardiology. Stress testing in January 2015 was negative. She's not had a cardiac catheterization secondary to her chronic kidney disease.  Past Medical History  Diagnosis Date  . Hyperkalemia   . Hypothyroidism   . COPD (chronic obstructive pulmonary disease)   . Hyponatremia   . Dizziness   . Fibromyalgia   . Breast cancer   . Multiple myeloma   . Mucositis   . Hx of echocardiogram     a.  Echocardiogram (12/26/2012): EF 93-81%, grade 1 diastolic dysfunction;   b.  Echocardiogram (02/2013): EF 55-60%, no WMA, trivial effusion  . Fibromyalgia   . Arthritis   . Hx of cardiovascular stress test     LexiScan with low level exercise Myoview (02/2013): No ischemia, EF 72%; normal study  . Complication of anesthesia   . PONV (postoperative nausea and vomiting) 2008    after mastestomy  . Myocardial infarction     in past, patient was unaware.   . Anginal pain     used NTG x 2 May 31 and 06/15/13   . GERD (gastroesophageal reflux disease)   . Headache(784.0)   . Anxiety   . Depression   . CKD  (chronic kidney disease) stage 3, GFR 30-59 ml/min   . Anemia   . History of blood transfusion     last one May 12   . Neuropathy    Past Surgical History  Procedure Laterality Date  . History of port removal    . Status post stem cell transplant on September 28, 2008.    Marland Kitchen Abdominal hysterectomy  1981  . Cholecystectomy  1971  . Mastectomy Left 2008  . Cataract extraction, bilateral    . Breast surgery    . Eye surgery Bilateral     lens implant  . Breast reconstruction    . Portacath placement  12/2012    has had 2  . Av fistula placement Left 06/19/2013    Procedure: CREATION OF LEFT ARM ARTERIOVENOUS (AV) FISTULA ;  Surgeon: Angelia Mould, MD;  Location: [facility] OR;  Service: Vascular;  Laterality: Left;   Family History  Problem Relation Age of Onset  . Arthritis Mother   . Asthma Mother   . Cancer Sister   . Hyperlipidemia Brother    History  Substance Use Topics  . Smoking status: Former Smoker -- 1.00 packs/day for 30 years    Types: Cigarettes    Quit date: 02/15/2005  . Smokeless tobacco: Never Used  . Alcohol Use: No   OB History    No data available     Review  of Systems  Constitutional: Negative for fever.  Respiratory: Positive for chest tightness and shortness of breath. Negative for cough.   Cardiovascular: Positive for chest pain.  Gastrointestinal: Positive for nausea. Negative for vomiting and abdominal pain.  Genitourinary: Negative for dysuria.  Neurological: Negative for headaches.  Psychiatric/Behavioral: Negative for confusion.  All other systems reviewed and are negative.     Allergies  Codeine; Latex; Other; Adhesive; Iodinated diagnostic agents; and Sulfa antibiotics  Home Medications   Prior to Admission medications   Medication Sig Start Date End Date Taking? Authorizing Provider  acetaminophen (TYLENOL) 325 MG tablet Take 650 mg by mouth every 6 (six) hours as needed for mild pain or moderate pain.    Yes Historical  Provider, MD  albuterol (PROVENTIL HFA;VENTOLIN HFA) 108 (90 BASE) MCG/ACT inhaler Inhale 2 puffs into the lungs 2 (two) times daily as needed for wheezing or shortness of breath. 06/04/13  Yes [name], MD  ascorbic acid (VITAMIN C) 250 MG CHEW Chew 250 mg by mouth daily.   Yes Historical Provider, MD  aspirin EC 81 MG tablet Take 1 tablet (81 mg total) by mouth daily. 01/29/13  Yes Liliane Shi, PA-C  buPROPion (WELLBUTRIN XL) 300 MG 24 hr tablet Take 1 tablet each morning for Mood & Depression 11/02/13  Yes [name], MD  carboxymethylcellulose (REFRESH PLUS) 0.5 % SOLN Place 1 drop into both eyes every 2 (two) hours.   Yes Historical Provider, MD  cetirizine (ZYRTEC) 10 MG tablet Take 10 mg by mouth at bedtime.    Yes Historical Provider, MD  Cholecalciferol 4000 UNITS TABS Take 4,000 Units by mouth daily.   Yes Historical Provider, MD  ciprofloxacin (CIPRO) 500 MG tablet Take 1 tablet (500 mg total) by mouth daily with breakfast. 11/24/13  Yes Heather Laisure, PA-C  citalopram (CELEXA) 40 MG tablet TAKE 1 TABLET BY MOUTH ONCE DAILY. 11/03/13  Yes [name], MD  cromolyn (OPTICROM) 4 % ophthalmic solution Place 1 drop into both eyes 4 (four) times daily.   Yes Historical Provider, MD  cyanocobalamin 2000 MCG tablet Take 2,000 mcg by mouth daily.    Yes Historical Provider, MD  esomeprazole (NEXIUM) 40 MG capsule Take 1 capsule (40 mg total) by mouth daily. Each morning for acid indigestion 02/24/13 02/24/14 Yes [name], MD  Ferrous Sulfate 134 MG TABS Take 1 tablet by mouth every other day.    Yes Historical Provider, MD  fluticasone (FLONASE) 50 MCG/ACT nasal spray Place 2 sprays into both nostrils at bedtime.  03/17/13  Yes Historical Provider, MD  gabapentin (NEURONTIN) 300 MG capsule Take 1 capsule (300 mg total) by mouth at bedtime. 07/06/13  Yes [name], MD  isosorbide mononitrate (IMDUR) 30 MG 24 hr tablet Take 15 mg by mouth daily. Patient taking 15 mg, had a  severe migraine with 67m dose.   Yes Historical Provider, MD  levothyroxine (SYNTHROID, LEVOTHROID) 150 MCG tablet Take 150-225 mcg by mouth daily before breakfast. Takes 1565m Mon-Fri Takes 22567mSat, Sun   Yes Historical Provider, MD  metroNIDAZOLE (FLAGYL) 500 MG tablet Take 1 tablet (500 mg total) by mouth 3 (three) times daily. 11/24/13  Yes Heather Laisure, PA-C  nitroGLYCERIN (NITROSTAT) 0.4 MG SL tablet Place 1 tablet (0.4 mg total) under the tongue every 5 (five) minutes as needed for chest pain. 01/29/13  Yes Scott T WJoylene DraftA-C  Olopatadine HCl (PAZEO) 0.7 % SOLN Place 1 drop into both eyes daily.   Yes Historical Provider, MD  ondansetron (ZOFRAN-ODT) 8 MG disintegrating tablet Take 1 tablet (8 mg total) by mouth every 8 (eight) hours as needed for nausea or vomiting. 01/27/13  Yes Adrena E Johnson, PA-C  prochlorperazine (COMPAZINE) 10 MG tablet TAKE 1 TABLET BY MOUTH EVERY 6 HOURS AS NEEDED FOR NAUSEA OR VOMITING. Patient taking differently: TAKE 1 TABLET BY MOUTH EVERY AT BEDTIME AS NEEDED FOR NAUSEA OR VOMITING. 09/29/13  Yes Curt Bears, MD  ranitidine (ZANTAC) 300 MG tablet Take 1 tablet (300 mg total) by mouth at bedtime. For acid reflux and indigestion 02/24/13 02/24/14 Yes [name], MD  RESTASIS 0.05 % ophthalmic emulsion Take 1 drop by mouth 2 (two) times daily. 11/13/13  Yes Historical Provider, MD  sucralfate (CARAFATE) 1 G tablet Take 1 tablet (1 g total) by mouth 4 (four) times daily. Dissolve in water and swallow Patient taking differently: Take 1 g by mouth 3 (three) times daily with meals. Dissolve in water and swallow 02/24/13 02/24/14 Yes [name], MD  traMADol (ULTRAM) 50 MG tablet Take 1 tablet (50 mg total) by mouth every 6 (six) hours as needed. Patient taking differently: Take 50 mg by mouth every 6 (six) hours as needed for moderate pain.  11/24/13  Yes Heather Laisure, PA-C  zolpidem (AMBIEN) 5 MG tablet Take 5 mg by mouth at bedtime as needed for  sleep.   Yes Historical Provider, MD   BP 104/63 mmHg  Pulse 68  Temp(Src) 98.5 F (36.9 C) (Oral)  Resp 14  SpO2 98% Physical Exam  Constitutional: She is oriented to person, place, and time. She appears well-developed and well-nourished. No distress.  HENT:  Head: Normocephalic and atraumatic.  Eyes: Pupils are equal, round, and reactive to light.  Neck: Neck supple.  Cardiovascular: Normal rate, regular rhythm and normal heart sounds.   No murmur heard. Pulmonary/Chest: Effort normal and breath sounds normal. No respiratory distress. She has no wheezes. She exhibits no tenderness.  port  Abdominal: Soft. Bowel sounds are normal. There is no tenderness. There is no rebound.  Musculoskeletal: She exhibits no edema.  Neurological: She is alert and oriented to person, place, and time.  Skin: Skin is warm and dry.  Psychiatric: She has a normal mood and affect.  Nursing note and vitals reviewed.   ED Course  Procedures (including critical care time) Labs Review Labs Reviewed  CBC WITH DIFFERENTIAL - Abnormal; Notable for the following:    RBC 3.20 (*)    Hemoglobin 10.6 (*)    HCT 32.2 (*)    MCV 100.6 (*)    All other components within normal limits  BASIC METABOLIC PANEL - Abnormal; Notable for the following:    Potassium 3.5 (*)    CO2 18 (*)    Glucose, Bld 113 (*)    BUN 25 (*)    Creatinine, Ser 3.65 (*)    GFR calc non Af Amer 11 (*)    GFR calc Af Amer 13 (*)    Anion gap 16 (*)    All other components within normal limits  TROPONIN I    Imaging Review Dg Chest 2 View  12/02/2013   CLINICAL DATA:  Left-sided chest pain  EXAM: CHEST  2 VIEW  COMPARISON:  01/13/2013  FINDINGS: Cardiac shadow is stable. Breast implants are noted bilaterally. A right chest wall port is seen with catheter tip at the cavoatrial junction. No focal infiltrate or sizable effusion is seen. No acute bony abnormality is noted.  IMPRESSION: Postoperative change without acute  abnormality.    Electronically Signed   By: Inez Catalina M.D.   On: 12/02/2013 02:49     EKG Interpretation   Date/Time:  Wednesday December 02 2013 01:57:07 EST Ventricular Rate:  66 PR Interval:  173 QRS Duration: 83 QT Interval:  413 QTC Calculation: 433 R Axis:   46 Text Interpretation:  Sinus rhythm No significant change was found  Confirmed by Trevan Messman  MD, Santana Edell ([id number]) on 12/02/2013 3:37:56 AM      MDM   Final diagnoses:  Chest pain    Patient presents with chest pain. Nontoxic on exam.  Story somewhat atypical for angina; however, care to her pain is similar to prior anginal symptoms. Patient has already gotten an aspirin. Nitroglycerin paste was applied and patient reports improvement of symptoms. Workup is reassuring at this time including EKG that she's no acute ischemic changes.  Given the patient is followed by cardiology, cardiology was consulted. They agree with admission for chest pain rule out. Will admit to hospitalist as patient is currently being treated for diverticulitis and has a history of multiple myeloma. Discussed with Dr. Posey Pronto.      Merryl Hacker, MD 12/02/13 [phone]

## 2013-12-02 NOTE — ED Notes (Signed)
Per EMS, patient had sudden onset central chest pain without radiation that woke her from her sleep at 1200. Took 2 nitro and 324 prior to EMS arrival. EMS gave additional nitro and 4 zofran. Pain is currently 5/10.

## 2013-12-02 NOTE — Plan of Care (Signed)
Problem: Consults Goal: Chest Pain Patient Education (See Patient Education module for education specifics.)  Outcome: Completed/Met Date Met:  12/02/13 Goal: Skin Care Protocol Initiated - if Braden Score 18 or less If consults are not indicated, leave blank or document N/A  Outcome: Not Applicable Date Met:  18/29/93 Goal: Tobacco Cessation referral if indicated Outcome: Not Applicable Date Met:  71/69/67 Goal: Nutrition Consult-if indicated Outcome: Not Applicable Date Met:  89/38/10 Goal: Diabetes Guidelines if Diabetic/Glucose > 140 If diabetic or lab glucose is > 140 mg/dl - Initiate Diabetes/Hyperglycemia Guidelines & Document Interventions  Outcome: Not Applicable Date Met:  17/51/02  Problem: Phase I Progression Outcomes Goal: Hemodynamically stable Outcome: Completed/Met Date Met:  12/02/13 Goal: Anginal pain relieved Outcome: Completed/Met Date Met:  12/02/13 Goal: Aspirin unless contraindicated Outcome: Completed/Met Date Met:  12/02/13 Goal: MD aware of Cardiac Marker results Outcome: Completed/Met Date Met:  12/02/13 Goal: Voiding-avoid urinary catheter unless indicated Outcome: Not Applicable Date Met:  58/52/77 Goal: Other Phase I Outcomes/Goals Outcome: Completed/Met Date Met:  12/02/13  Problem: Phase II Progression Outcomes Goal: Hemodynamically stable Outcome: Completed/Met Date Met:  12/02/13 Goal: Anginal pain relieved Outcome: Completed/Met Date Met:  12/02/13 Goal: Stress Test if indicated Outcome: Completed/Met Date Met:  12/02/13 Goal: Cath/PCI Day Path if indicated Outcome: Not Applicable Date Met:  82/42/35 Goal: CV Risk Factors identified Outcome: Completed/Met Date Met:  12/02/13 Goal: Cardiac Rehab if ordered Outcome: Not Applicable Date Met:  36/14/43 Goal: If positive for MI, change to MI Path Outcome: Not Applicable Date Met:  15/40/08 Goal: Other Phase II Outcomes/Goals Outcome: Completed/Met Date Met:  12/02/13  Problem: Phase III  Progression Outcomes Goal: Hemodynamically stable Outcome: Completed/Met Date Met:  12/02/13 Goal: No anginal pain Outcome: Completed/Met Date Met:  12/02/13 Goal: Cath/PCI Path as indicated Outcome: Not Applicable Date Met:  67/61/95 Goal: Vascular site scale level 0 - I Vascular Site Scale Level 0: No bruising/bleeding/hematoma Level I (Mild): Bruising/Ecchymosis, minimal bleeding/ooozing, palpable hematoma < 3 cm Level II (Moderate): Bleeding not affecting hemodynamic parameters, pseudoaneurysm, palpable hematoma > 3 cm Level III (Severe) Bleeding which affects hemodynamic parameters or retroperitoneal hemorrhage  Outcome: Not Applicable Date Met:  09/32/67 Goal: Discharge plan remains appropriate-arrangements made Outcome: Completed/Met Date Met:  12/02/13 Goal: Tolerating diet Outcome: Completed/Met Date Met:  12/02/13 Goal: If positive for MI, change to MI Path Outcome: Not Applicable Date Met:  12/45/80 Goal: Other Phase III Outcomes/Goals Outcome: Completed/Met Date Met:  12/02/13  Problem: Discharge Progression Outcomes Goal: No anginal pain Outcome: Completed/Met Date Met:  12/02/13 Goal: Hemodynamically stable Outcome: Completed/Met Date Met:  99/83/38 Goal: Complications resolved/controlled Outcome: Completed/Met Date Met:  12/02/13 Goal: Barriers To Progression Addressed/Resolved Outcome: Completed/Met Date Met:  12/02/13 Goal: Discharge plan in place and appropriate Outcome: Completed/Met Date Met:  12/02/13 Goal: Vascular site scale level 0 - I Vascular Site Scale Level 0: No bruising/bleeding/hematoma Level I (Mild): Bruising/Ecchymosis, minimal bleeding/ooozing, palpable hematoma < 3 cm Level II (Moderate): Bleeding not affecting hemodynamic parameters, pseudoaneurysm, palpable hematoma > 3 cm Level III (Severe) Bleeding which affects hemodynamic parameters or retroperitoneal hemorrhage  Outcome: Not Applicable Date Met:  25/05/39 Goal: Tolerates  diet Outcome: Completed/Met Date Met:  12/02/13 Goal: Activity appropriate for discharge plan Outcome: Completed/Met Date Met:  12/02/13 Goal: Other Discharge Outcomes/Goals Outcome: Completed/Met Date Met:  12/02/13

## 2013-12-02 NOTE — Discharge Instructions (Signed)
Cardiac Diet A cardiac diet can help stop heart disease or a stroke from happening. It involves eating less unhealthy fats and eating more healthy fats.  FOODS TO AVOID OR LIMIT  Limit saturated fats. This type of fat is found in oils and dairy products, such as:  Coconut oil.  Palm oil.  Cocoa butter.  Butter.  Avoid trans-fat or hydrogenated oils. These are found in fried or pre-made baked goods, such as:  Margarine.  Pre-made cookies, cakes, and crackers.  Limit processed meats (hot dogs, deli meats, sausage) to 3 ounces a week.  Limit high-fat meats (marbled meats, fried chicken, or chicken with skin) to 3 ounces a week.  Limit salt (sodium) to 1500 milligrams a day.   Limit sweets and drinks with added sugar to no more than 5 servings a week. One serving is:  1 tablespoon of sugar.  1 tablespoon of jelly or jam.   cup sorbet.  1 cup lemonade.   cup regular soda. EAT MORE OF THE FOLLOWING FOODS Fruit  Eat 4to 5 servings a day. One serving of fruit is:  1 medium whole fruit.   cup dried fruit.   cup of fresh, frozen, or canned fruit.   cup 100% fruit juice. Vegetables  Eat 4 to 5 servings a day. One serving is:  1 cup raw leafy vegetables.   cup raw or cooked, cut-up vegetables.   cup vegetable juice. Whole Grains  Eat 3 servings a day (1 ounce equals 1 serving). Legumes (such as beans, peas, and lentils)   Eat at least 4 servings a week ( cup equals 1 serving). Nuts and Seeds   Eat at least 4 servings a week ( cup equals 1 serving). Dietary Fiber  Eat 20 to 30 grams a day. Some foods high in dietary fiber include:  Dried beans.  Citrus fruits.  Apples, bananas.  Broccoli, Brussels sprouts, and eggplant.  Oats. Omega-3 Fats  Eat food with omega-3 fats. You can also take a dietary pill (supplement) that has 1 gram of DHA and EPA. Have 3.5 ounces of fatty fish a week, such as:  Salmon.  Mackerel.  [place]  tuna.  Sardines.  Lake trout.  Herring. PREPARING YOUR FOOD  Broil, bake, steam, or roast foods. Do not fry food. Do not cook food in butter (fat).  Use non-stick cooking sprays.  Remove skin from poultry, such as chicken and [place].  Remove fat from meat.  Take the fat off the top of stews, soups, and gravy.  Use lemon or herbs to flavor food instead of using butter or margarine.  Use nonfat yogurt, salsa, or low-fat dressings for salads. Document Released: 07/03/2011 Document Reviewed: 07/03/2011 [facility] Patient Information 2015 [place]. This information is not intended to replace advice given to you by your health care provider. Make sure you discuss any questions you have with your health care provider.   Angina Pectoris Angina pectoris is extreme discomfort in your chest, neck, or arm. Your doctor may call it just angina. It is caused by a lack of oxygen to your heart wall. It may feel like tightness or heavy pressure. It may feel like a crushing or squeezing pain. Some people say it feels like gas. It may go down your shoulders, back, and arms. Some people have symptoms other than pain. These include:  Tiredness.  Shortness of breath.  Cold sweats.  Feeling sick to your stomach (nausea). There are four types of angina:  Stable angina. This type  often lasts the same amount of time each time it happens. Activity, stress, or excitement can bring it on. It often gets better after taking a medicine called nitroglycerin. This goes under your tongue.  Unstable angina. This type can happen when you are not active or even during sleep. It can suddenly get worse or happen more often. It may not get better after taking the special medicine. It can last up to 30 minutes.  Microvascular angina. This type is more common in women. It may be more severe or last longer than other types.  Prinzmetal angina. This type often happens when you are not active or in the early  morning hours. HOME CARE   Only take medicines as told by your doctor.  Stay active or exercise more as told by your doctor.  Limit very hard activity as told by your doctor.  Limit heavy lifting as told by your doctor.  Keep a healthy weight.  Learn about and eat foods that are healthy for your heart.  Do not use any tobacco such as cigarettes, chewing tobacco, or e-cigarettes. GET HELP RIGHT AWAY IF:   You have chest, neck, deep shoulder, or arm pain or discomfort that lasts more than a few minutes.  You have chest, neck, deep shoulder, or arm pain or discomfort that goes away and comes back over and over again.  You have heavy sweating that seems to happen for no reason.  You have shortness of breath or trouble breathing.  Your angina does not get better after a few minutes of rest.  Your angina does not get better after you take nitroglycerin medicine. These can all be symptoms of a heart attack. Get help right away. Call your local emergency service (911 in [place] Do not  drive yourself to the hospital. Do not  wait to for your symptoms to go away. MAKE SURE YOU:   Understand these instructions.  Will watch your condition.  Will get help right away if you are not doing well or get worse. Document Released: 06/20/2007 Document Revised: 01/06/2013 Document Reviewed: 05/05/2013 [facility] Patient Information 2015 [place], [place]. This information is not intended to replace advice given to you by your health care provider. Make sure you discuss any questions you have with your health care provider.

## 2013-12-07 ENCOUNTER — Telehealth: Payer: Self-pay | Admitting: *Deleted

## 2013-12-07 DIAGNOSIS — C9000 Multiple myeloma not having achieved remission: Secondary | ICD-10-CM

## 2013-12-07 NOTE — Telephone Encounter (Signed)
Pt called stating she has new hip pain that is localized, she also feels like she is anemic.  Per Dr Vista Mink, pt can come in and be evaluated by [name].  Appt made for lab to check CBC and appt with Cyndee.  Pt verbalized understanding.

## 2013-12-08 ENCOUNTER — Other Ambulatory Visit (HOSPITAL_BASED_OUTPATIENT_CLINIC_OR_DEPARTMENT_OTHER): Payer: Medicare Other

## 2013-12-08 ENCOUNTER — Ambulatory Visit (HOSPITAL_BASED_OUTPATIENT_CLINIC_OR_DEPARTMENT_OTHER): Payer: Medicare Other | Admitting: Nurse Practitioner

## 2013-12-08 ENCOUNTER — Other Ambulatory Visit: Payer: Self-pay

## 2013-12-08 ENCOUNTER — Ambulatory Visit (HOSPITAL_COMMUNITY)
Admission: RE | Admit: 2013-12-08 | Discharge: 2013-12-08 | Disposition: A | Discharge status: Home / Self Care | Payer: Medicare Other | Source: Ambulatory Visit | Attending: Nurse Practitioner | Admitting: Nurse Practitioner

## 2013-12-08 VITALS — BP 106/54 | HR 68 | Temp 98.6°F | Resp 18 | Ht 65.0 in | Wt 173.7 lb

## 2013-12-08 DIAGNOSIS — C9000 Multiple myeloma not having achieved remission: Secondary | ICD-10-CM | POA: Diagnosis not present

## 2013-12-08 DIAGNOSIS — R238 Other skin changes: Secondary | ICD-10-CM

## 2013-12-08 DIAGNOSIS — C9002 Multiple myeloma in relapse: Secondary | ICD-10-CM

## 2013-12-08 DIAGNOSIS — R530 Neoplastic (malignant) related fatigue: Secondary | ICD-10-CM

## 2013-12-08 DIAGNOSIS — R21 Rash and other nonspecific skin eruption: Secondary | ICD-10-CM

## 2013-12-08 DIAGNOSIS — E039 Hypothyroidism, unspecified: Secondary | ICD-10-CM

## 2013-12-08 DIAGNOSIS — M25552 Pain in left hip: Secondary | ICD-10-CM | POA: Insufficient documentation

## 2013-12-08 DIAGNOSIS — N184 Chronic kidney disease, stage 4 (severe): Secondary | ICD-10-CM

## 2013-12-08 DIAGNOSIS — B354 Tinea corporis: Secondary | ICD-10-CM

## 2013-12-08 DIAGNOSIS — L909 Atrophic disorder of skin, unspecified: Secondary | ICD-10-CM

## 2013-12-08 LAB — CBC WITH DIFFERENTIAL/PLATELET
BASO%: 0.7 % (ref 0.0–2.0)
BASOS ABS: 0.1 10*3/uL (ref 0.0–0.1)
EOS%: 2.9 % (ref 0.0–7.0)
Eosinophils Absolute: 0.2 10*3/uL (ref 0.0–0.5)
HCT: 31.8 % — ABNORMAL LOW (ref 34.8–46.6)
HEMOGLOBIN: 10.1 g/dL — AB (ref 11.6–15.9)
LYMPH#: 1.6 10*3/uL (ref 0.9–3.3)
LYMPH%: 18.5 % (ref 14.0–49.7)
MCH: 32.1 pg (ref 25.1–34.0)
MCHC: 31.8 g/dL (ref 31.5–36.0)
MCV: 100.7 fL (ref 79.5–101.0)
MONO#: 1.1 10*3/uL — ABNORMAL HIGH (ref 0.1–0.9)
MONO%: 13.4 % (ref 0.0–14.0)
NEUT%: 64.5 % (ref 38.4–76.8)
NEUTROS ABS: 5.4 10*3/uL (ref 1.5–6.5)
Platelets: 258 10*3/uL (ref 145–400)
RBC: 3.16 10*6/uL — ABNORMAL LOW (ref 3.70–5.45)
RDW: 14.3 % (ref 11.2–14.5)
WBC: 8.4 10*3/uL (ref 3.9–10.3)

## 2013-12-08 MED ORDER — KETOCONAZOLE 2 % EX CREA: 1.0000 "application " | TOPICAL_CREAM | Freq: Two times a day (BID) | CUTANEOUS | Status: DC | PRN

## 2013-12-08 MED ORDER — NYSTATIN 100000 UNIT/GM EX POWD: CUTANEOUS | Status: DC

## 2013-12-08 MED ORDER — LOTEPREDNOL ETABONATE 0.2 % OP SUSP: 1.0000 [drp] | Freq: Four times a day (QID) | OPHTHALMIC | Status: DC

## 2013-12-09 LAB — TSH CHCC: TSH: 8.952 m(IU)/L — ABNORMAL HIGH (ref 0.308–3.960)

## 2013-12-10 ENCOUNTER — Encounter: Payer: Self-pay | Admitting: Nurse Practitioner

## 2013-12-10 NOTE — Assessment & Plan Note (Signed)
Patient has mild candida skin breakdown; with approx 1 cm open area directly under abd fold.  Advised pt to keep area clean and dry;and to use prescribed Nystatin powder to site as directed.

## 2013-12-10 NOTE — Assessment & Plan Note (Signed)
Patient received last carfilzomib/cytoxan/dexamethasone chemo regimen 04/29/2013.  She is currently under observation only.   She is scheduled for lab draw on 01/05/14.  She will return 01/12/14 for a follow up visit. She knows to call in the interim for any new worries or concerns.

## 2013-12-10 NOTE — Progress Notes (Signed)
will   [facility]   HPI: Nancy Norris 73 y.o. female diagnosed with multiple myeloma.  Patient is status post carfilzomib/cytoxan/dexamthasone chemo regimen. Currently undergoing observation only.   Patient requested an urgent care visit today to discuss multiple issues.   She is c/o of a 3 week hx of left hip pain.  She denies any known trauma or injury.    She also has a rash to various areas of her body that has been present for several months.  She denies any pruritis.   She is also c/o worsening fatigue recently.  She continues to take her antidepressants as previously directed.  She denies any worsening depression symptoms.     HPI  CURRENT THERAPY: Upcoming Treatment Dates - MYELOMA SALVAGE Cyclophosphamide / Carfilzomib / Dexamethasone (CCd) q28d Days with orders from any treatment category:  No upcoming days in selected categories.    ROS  Past Medical History  Diagnosis Date  . Hyperkalemia   . Hypothyroidism   . COPD (chronic obstructive pulmonary disease)   . Hyponatremia   . Dizziness   . Fibromyalgia   . Breast cancer   . Multiple myeloma   . Mucositis   . Hx of echocardiogram     a.  Echocardiogram (12/26/2012): EF 35-70%, grade 1 diastolic dysfunction;   b.  Echocardiogram (02/2013): EF 55-60%, no WMA, trivial effusion  . Fibromyalgia   . Arthritis   . Hx of cardiovascular stress test     LexiScan with low level exercise Myoview (02/2013): No ischemia, EF 72%; normal study  . Complication of anesthesia   . PONV (postoperative nausea and vomiting) 2008    after mastestomy  . Myocardial infarction     in past, patient was unaware.   . Anginal pain     used NTG x 2 May 31 and 06/15/13   . GERD (gastroesophageal reflux disease)   . Headache(784.0)   . Anxiety   . Depression   . CKD (chronic kidney disease) stage 3, GFR 30-59 ml/min   . Anemia   . History of blood transfusion     last one May 12   . Neuropathy     Past Surgical  History  Procedure Laterality Date  . History of port removal    . Status post stem cell transplant on September 28, 2008.    Marland Kitchen Abdominal hysterectomy  1981  . Cholecystectomy  1971  . Mastectomy Left 2008  . Cataract extraction, bilateral    . Breast surgery    . Eye surgery Bilateral     lens implant  . Breast reconstruction    . Portacath placement  12/2012    has had 2  . Av fistula placement Left 06/19/2013    Procedure: CREATION OF LEFT ARM ARTERIOVENOUS (AV) FISTULA ;  Surgeon: Angelia Mould, MD;  Location: [facility] OR;  Service: Vascular;  Laterality: Left;    has Multiple myeloma in remission; Acute on chronic renal failure; Hypercalcemia; HX: breast cancer; Multiple myeloma in relapse; Anemia; Sleep disturbance; Asthma, chronic; Hypothyroid; Chronic diastolic heart failure; HTN (hypertension); GERD (gastroesophageal reflux disease); CKD (chronic kidney disease) stage 3, GFR 30-59 ml/min; COPD (chronic obstructive pulmonary disease) with emphysema; Fibromyalgia; Mixed hyperlipidemia; Other abnormal glucose; Vitamin D Deficiency; Chronic kidney disease, stage IV (severe); Encounter for long-term (current) use of other medications; Depression; Chest pain; Multiple myeloma; Hip pain; Fatigue; Tinea corporis; Skin breakdown; and Hypothyroidism on her problem list.     is allergic to codeine;  latex; other; adhesive; iodinated diagnostic agents; and sulfa antibiotics.    Medication List       This list is accurate as of: 12/08/13 11:59 PM.  Always use your most recent med list.               acetaminophen 325 MG tablet  Commonly known as:  TYLENOL  Take 650 mg by mouth every 6 (six) hours as needed for mild pain or moderate pain.     albuterol 108 (90 BASE) MCG/ACT inhaler  Commonly known as:  PROVENTIL HFA;VENTOLIN HFA  Inhale 2 puffs into the lungs 2 (two) times daily as needed for wheezing or shortness of breath.     ascorbic acid 250 MG Chew  Commonly known as:   VITAMIN C  Chew 250 mg by mouth daily.     aspirin EC 81 MG tablet  Take 1 tablet (81 mg total) by mouth daily.     carboxymethylcellulose 0.5 % Soln  Commonly known as:  REFRESH PLUS  Place 1 drop into both eyes every 2 (two) hours.     cetirizine 10 MG tablet  Commonly known as:  ZYRTEC  Take 10 mg by mouth at bedtime.     Cholecalciferol 4000 UNITS Tabs  Take 4,000 Units by mouth daily.     ciprofloxacin 500 MG tablet  Commonly known as:  CIPRO  Take 1 tablet (500 mg total) by mouth daily with breakfast.     citalopram 40 MG tablet  Commonly known as:  CELEXA  TAKE 1 TABLET BY MOUTH ONCE DAILY.     cromolyn 4 % ophthalmic solution  Commonly known as:  OPTICROM  Place 1 drop into both eyes 4 (four) times daily.     cyanocobalamin 2000 MCG tablet  Take 2,000 mcg by mouth daily.     esomeprazole 40 MG capsule  Commonly known as:  NEXIUM  Take 1 capsule (40 mg total) by mouth daily. Each morning for acid indigestion     Ferrous Sulfate 134 MG Tabs  Take 1 tablet by mouth every other day.     fluticasone 50 MCG/ACT nasal spray  Commonly known as:  FLONASE  Place 2 sprays into both nostrils at bedtime.     gabapentin 300 MG capsule  Commonly known as:  NEURONTIN  Take 1 capsule (300 mg total) by mouth at bedtime.     isosorbide mononitrate 30 MG 24 hr tablet  Commonly known as:  IMDUR  Take 15 mg by mouth daily. Patient taking 15 mg, had a severe migraine with $RemoveBefor'30mg'wzznTLEShbLj$  dose.     ketoconazole 2 % cream  Commonly known as:  NIZORAL  Apply 1 application topically 2 (two) times daily as needed for irritation.     levothyroxine 150 MCG tablet  Commonly known as:  SYNTHROID, LEVOTHROID  - Take 150-225 mcg by mouth daily before breakfast. Takes 158mcg Mon-Fri  - Takes 250mcg Sat, Sun     loteprednol 0.2 % Susp  Commonly known as:  ALREX  Place 1 drop into both eyes 4 (four) times daily.     nitroGLYCERIN 0.4 MG SL tablet  Commonly known as:  NITROSTAT  Place 1  tablet (0.4 mg total) under the tongue every 5 (five) minutes as needed for chest pain.     nystatin 100000 UNIT/GM Powd  Apply topically to area TID PRN.     ondansetron 8 MG disintegrating tablet  Commonly known as:  ZOFRAN-ODT  Take 1 tablet (8 mg total)  by mouth every 8 (eight) hours as needed for nausea or vomiting.     PAZEO 0.7 % Soln  Generic drug:  Olopatadine HCl  Place 1 drop into both eyes daily.     prochlorperazine 10 MG tablet  Commonly known as:  COMPAZINE  TAKE 1 TABLET BY MOUTH EVERY 6 HOURS AS NEEDED FOR NAUSEA OR VOMITING.     ranitidine 300 MG tablet  Commonly known as:  ZANTAC  Take 1 tablet (300 mg total) by mouth at bedtime. For acid reflux and indigestion     RESTASIS 0.05 % ophthalmic emulsion  Generic drug:  cycloSPORINE  Take 1 drop by mouth 2 (two) times daily.     sucralfate 1 G tablet  Commonly known as:  CARAFATE  Take 1 tablet (1 g total) by mouth 4 (four) times daily. Dissolve in water and swallow     traMADol 50 MG tablet  Commonly known as:  ULTRAM  Take 1 tablet (50 mg total) by mouth every 6 (six) hours as needed.     zolpidem 5 MG tablet  Commonly known as:  AMBIEN  Take 5 mg by mouth at bedtime as needed for sleep.         PHYSICAL EXAMINATION  Blood pressure 106/54, pulse 68, temperature 98.6 F (37 C), temperature source Oral, resp. rate 18, height $RemoveBe'5\' 5"'oKrfOUmQW$  (1.651 m), weight 173 lb 11.2 oz (78.79 kg), SpO2 100 %.  Physical Exam  Constitutional: She is oriented to person, place, and time and well-developed, well-nourished, and in no distress.  HENT:  Head: Normocephalic and atraumatic.  Mouth/Throat: Oropharynx is clear and moist.  Eyes: Conjunctivae and EOM are normal. Pupils are equal, round, and reactive to light. Right eye exhibits no discharge. Left eye exhibits no discharge. No scleral icterus.  Neck: Normal range of motion. Neck supple. No JVD present. No tracheal deviation present. No thyromegaly present.    Cardiovascular: Normal rate, regular rhythm, normal heart sounds and intact distal pulses.   Pulmonary/Chest: Effort normal and breath sounds normal. No respiratory distress. She has no wheezes. She has no rales.  Abdominal: Soft. Bowel sounds are normal. She exhibits no distension and no mass. There is no tenderness. There is no rebound and no guarding.  Musculoskeletal: Normal range of motion. She exhibits tenderness. She exhibits no edema.  Left upper hip area with mild tenderness with palp.  Observed with full ROM; and ambulating with no difficulty.   Lymphadenopathy:    She has no cervical adenopathy.  Neurological: She is alert and oriented to person, place, and time. Gait normal.  Skin: Skin is warm and dry. Rash noted. No erythema.  Apparent ringworm rash to bilat axilla regions, around trunk bilaterally, and to various areas of bilat legs. No evidence of active infection.   Also- has area under abd fold with pink/red skin and approx 1 cm crack in skin. No cellulitic infection noted on exam.   Psychiatric: Affect normal.  Nursing note and vitals reviewed.   LABORATORY DATA:. Appointment on 12/08/2013  Component Date Value Ref Range Status  . WBC 12/08/2013 8.4  3.9 - 10.3 10e3/uL Final  . NEUT# 12/08/2013 5.4  1.5 - 6.5 10e3/uL Final  . HGB 12/08/2013 10.1* 11.6 - 15.9 g/dL Final  . HCT 12/08/2013 31.8* 34.8 - 46.6 % Final  . Platelets 12/08/2013 258  145 - 400 10e3/uL Final  . MCV 12/08/2013 100.7  79.5 - 101.0 fL Final  . MCH 12/08/2013 32.1  25.1 - 34.0  pg Final  . MCHC 12/08/2013 31.8  31.5 - 36.0 g/dL Final  . RBC 12/08/2013 3.16* 3.70 - 5.45 10e6/uL Final  . RDW 12/08/2013 14.3  11.2 - 14.5 % Final  . lymph# 12/08/2013 1.6  0.9 - 3.3 10e3/uL Final  . MONO# 12/08/2013 1.1* 0.1 - 0.9 10e3/uL Final  . Eosinophils Absolute 12/08/2013 0.2  0.0 - 0.5 10e3/uL Final  . Basophils Absolute 12/08/2013 0.1  0.0 - 0.1 10e3/uL Final  . NEUT% 12/08/2013 64.5  38.4 - 76.8 % Final  .  LYMPH% 12/08/2013 18.5  14.0 - 49.7 % Final  . MONO% 12/08/2013 13.4  0.0 - 14.0 % Final  . EOS% 12/08/2013 2.9  0.0 - 7.0 % Final  . BASO% 12/08/2013 0.7  0.0 - 2.0 % Final  . TSH 12/08/2013 8.952* 0.308 - 3.960 m(IU)/L Final     RADIOGRAPHIC STUDIES: Dg Hip Complete Left  12/08/2013   CLINICAL DATA:  C/o left hip pain x 3 weeks. Dx'd with multiple myeloma.  EXAM: LEFT HIP - COMPLETE 2+ VIEW  COMPARISON:  CT 11/23/2013  FINDINGS: There is no evidence of hip fracture or dislocation. Degenerative disc disease at the lumbosacral junction. There is no evidence of arthropathy or other focal bone abnormality.  IMPRESSION: Negative left hip.   Electronically Signed   By: Arne Cleveland M.D.   On: 12/08/2013 16:51    ASSESSMENT/PLAN:    Chronic kidney disease, stage IV (severe) Hx of chronic kidney disease; followed by nephrologist.   Fatigue Patient c/o increased fatigue recently. Reviewed in detail multiple potential causes of increased fatigue complaint.  Patient admits to depression; but states that she is taking her antidepressant meds as previously directed; and feels no worse depression at current time. Hemoglobin stable per labs drawn today.   TSH level drawn; and results were pending at time patient was at cancer center.  Patient was previously prescribed Synthroid; but unclear if she is actually taking as directed.   Addendum:  TSH level 8.952.  Patient was previously prescribed Synthroid 150 mcg Monday-Friday; and 225 mcg on Sat//Sun.  Will call patient to review TSH level; and will advise for patient to follow up with her PCP for further management of her hypothyroidism.   Hip pain Patient is c/o of 3 week hx of left hip pain.  Denies any recent injury or trauma to hip.  No evidence of injury to left hip on exam.  Patient ambulating with no difficulty. Ordered a plain film hip/pelvis xray to further eval left hip pain complaint.   Addendum: Left hip xray was negative for any acute  findings. Revealed some degenerative disc disease at the lumbrosacral junction. Will call patient to review xray results; and will advise that she follow up with orthopedics if left hip pain continues or worsens. Patient has Ultram at home to take for pain.   Hypothyroidism TSH level drawn; and results were pending at time patient was at cancer center.  Patient was previously prescribed Synthroid; but unclear if she is actually taking as directed.   Addendum:  TSH level 8.952.  Patient was previously prescribed Synthroid 150 mcg Monday-Friday; and 225 mcg on Sat//Sun.  Will call patient to review TSH level; and will advise for patient to follow up with her PCP for further management of her hypothyroidism.    Multiple myeloma in relapse Patient received last carfilzomib/cytoxan/dexamethasone chemo regimen 04/29/2013.  She is currently under observation only.   She is scheduled for lab draw on 01/05/14.  She will return 01/12/14 for a follow up visit. She knows to call in the interim for any new worries or concerns.   Skin breakdown Patient has mild candida skin breakdown; with approx 1 cm open area directly under abd fold.  Advised pt to keep area clean and dry;and to use prescribed Nystatin powder to site as directed.   Tinea corporis Pt reports chronic, progressive rash to general body for quite some time. Appears that rash may very well be ringworm-type rash.  Prescribed Nizoral topical cream to apply to rash.  Advised pt that she may very well need a derm eval if rash does not improve.   Patient stated understanding of all instructions; and was in agreement with this plan of care. The patient knows to call the clinic with any problems, questions or concerns.   Review/collaboration with Dr. Julien Nordmann regarding all aspects of patient's visit today.   Total time spent with patient was 40 minutes;  with greater than 75 percent of that time spent in face to face counseling regarding her symptoms,  follow up regarding labs and xrays,  and coordination of care and follow up.  Disclaimer: This note was dictated with voice recognition software. Similar sounding words can inadvertently be transcribed and may not be corrected upon review.   Drue Second, NP 12/10/2013

## 2013-12-10 NOTE — Assessment & Plan Note (Signed)
Patient c/o increased fatigue recently. Reviewed in detail multiple potential causes of increased fatigue complaint.  Patient admits to depression; but states that she is taking her antidepressant meds as previously directed; and feels no worse depression at current time. Hemoglobin stable per labs drawn today.   TSH level drawn; and results were pending at time patient was at cancer center.  Patient was previously prescribed Synthroid; but unclear if she is actually taking as directed.   Addendum:  TSH level 8.952.  Patient was previously prescribed Synthroid 150 mcg Monday-Friday; and 225 mcg on Sat//Sun.  Will call patient to review TSH level; and will advise for patient to follow up with her PCP for further management of her hypothyroidism.

## 2013-12-10 NOTE — Assessment & Plan Note (Signed)
Hx of chronic kidney disease; followed by nephrologist.

## 2013-12-10 NOTE — Assessment & Plan Note (Signed)
TSH level drawn; and results were pending at time patient was at cancer center.  Patient was previously prescribed Synthroid; but unclear if she is actually taking as directed.   Addendum:  TSH level 8.952.  Patient was previously prescribed Synthroid 150 mcg Monday-Friday; and 225 mcg on Sat//Sun.  Will call patient to review TSH level; and will advise for patient to follow up with her PCP for further management of her hypothyroidism.

## 2013-12-10 NOTE — Assessment & Plan Note (Addendum)
Patient is c/o of 3 week hx of left hip pain.  Denies any recent injury or trauma to hip.  No evidence of injury to left hip on exam.  Patient ambulating with no difficulty. Ordered a plain film hip/pelvis xray to further eval left hip pain complaint.   Addendum: Left hip xray was negative for any acute findings. Revealed some degenerative disc disease at the lumbrosacral junction. Will call patient to review xray results; and will advise that she follow up with orthopedics if left hip pain continues or worsens. Patient has Ultram at home to take for pain.

## 2013-12-10 NOTE — Assessment & Plan Note (Signed)
Pt reports chronic, progressive rash to general body for quite some time. Appears that rash may very well be ringworm-type rash.  Prescribed Nizoral topical cream to apply to rash.  Advised pt that she may very well need a derm eval if rash does not improve.

## 2013-12-11 ENCOUNTER — Telehealth: Payer: Self-pay | Admitting: *Deleted

## 2013-12-11 ENCOUNTER — Telehealth: Payer: Self-pay

## 2013-12-11 NOTE — Telephone Encounter (Signed)
Called Nancy Norris.  Reports feeling better today.  Also has an appointment with Dr. [name] on December 16, 2013 to be evaluated for Thyroid disease because she can't think of any reason why she is so tired.  Denies questions or any new symptoms.

## 2013-12-11 NOTE — Telephone Encounter (Signed)
S/w pt that hip xray was OK. Her TSH was still elevated and she needs to f/u w/PCP Dr [name]. She stated she is scheduled to see him December 2nd. She also stated her skin is improving.

## 2013-12-11 NOTE — Telephone Encounter (Signed)
-----   Message from Drue Second, NP sent at 12/10/2013 10:46 AM EST ----- PROVIDER:  Juluis Rainier ONLY Triage: follow up call 24-48 hours please.

## 2013-12-12 ENCOUNTER — Encounter: Payer: Self-pay | Admitting: *Deleted

## 2013-12-16 ENCOUNTER — Ambulatory Visit (INDEPENDENT_AMBULATORY_CARE_PROVIDER_SITE_OTHER): Payer: Medicare Other | Admitting: Physician Assistant

## 2013-12-16 ENCOUNTER — Encounter: Payer: Self-pay | Admitting: Physician Assistant

## 2013-12-16 VITALS — BP 110/62 | HR 72 | Temp 97.7°F | Resp 16 | Ht 65.0 in | Wt 171.0 lb

## 2013-12-16 DIAGNOSIS — R5382 Chronic fatigue, unspecified: Secondary | ICD-10-CM

## 2013-12-16 DIAGNOSIS — R7303 Prediabetes: Secondary | ICD-10-CM

## 2013-12-16 DIAGNOSIS — G479 Sleep disorder, unspecified: Secondary | ICD-10-CM

## 2013-12-16 DIAGNOSIS — R7309 Other abnormal glucose: Secondary | ICD-10-CM

## 2013-12-16 DIAGNOSIS — F329 Major depressive disorder, single episode, unspecified: Secondary | ICD-10-CM

## 2013-12-16 DIAGNOSIS — Z853 Personal history of malignant neoplasm of breast: Secondary | ICD-10-CM

## 2013-12-16 DIAGNOSIS — N183 Chronic kidney disease, stage 3 unspecified: Secondary | ICD-10-CM

## 2013-12-16 DIAGNOSIS — J438 Other emphysema: Secondary | ICD-10-CM

## 2013-12-16 DIAGNOSIS — Z0001 Encounter for general adult medical examination with abnormal findings: Secondary | ICD-10-CM

## 2013-12-16 DIAGNOSIS — E039 Hypothyroidism, unspecified: Secondary | ICD-10-CM

## 2013-12-16 DIAGNOSIS — K2100 Gastro-esophageal reflux disease with esophagitis, without bleeding: Secondary | ICD-10-CM

## 2013-12-16 DIAGNOSIS — F32A Depression, unspecified: Secondary | ICD-10-CM

## 2013-12-16 DIAGNOSIS — J45909 Unspecified asthma, uncomplicated: Secondary | ICD-10-CM

## 2013-12-16 DIAGNOSIS — G4733 Obstructive sleep apnea (adult) (pediatric): Secondary | ICD-10-CM

## 2013-12-16 DIAGNOSIS — E2839 Other primary ovarian failure: Secondary | ICD-10-CM

## 2013-12-16 DIAGNOSIS — Z79899 Other long term (current) drug therapy: Secondary | ICD-10-CM

## 2013-12-16 DIAGNOSIS — C9000 Multiple myeloma not having achieved remission: Secondary | ICD-10-CM

## 2013-12-16 DIAGNOSIS — E559 Vitamin D deficiency, unspecified: Secondary | ICD-10-CM

## 2013-12-16 DIAGNOSIS — E782 Mixed hyperlipidemia: Secondary | ICD-10-CM

## 2013-12-16 DIAGNOSIS — I10 Essential (primary) hypertension: Secondary | ICD-10-CM

## 2013-12-16 DIAGNOSIS — M797 Fibromyalgia: Secondary | ICD-10-CM

## 2013-12-16 DIAGNOSIS — R6889 Other general symptoms and signs: Secondary | ICD-10-CM

## 2013-12-16 DIAGNOSIS — Z789 Other specified health status: Secondary | ICD-10-CM

## 2013-12-16 DIAGNOSIS — D649 Anemia, unspecified: Secondary | ICD-10-CM

## 2013-12-16 DIAGNOSIS — I5032 Chronic diastolic (congestive) heart failure: Secondary | ICD-10-CM

## 2013-12-16 NOTE — Progress Notes (Signed)
MEDICARE ANNUAL WELLNESS VISIT AND FOLLOW UP  Assessment:   1. Prediabetes Discussed general issues about diabetes pathophysiology and management., Educational material distributed., Suggested low cholesterol diet., Encouraged aerobic exercise., Discussed foot care., Reminded to get yearly retinal exam. - [facility] DIABETES FOOT EXAM  2. Estrogen deficiency - DG Bone Density; Future  3. Chronic fatigue Check labs, correct TSH, get sleep study - Ambulatory referral to Sleep Studies  4. OSA (obstructive sleep apnea) - Ambulatory referral to Sleep Studies  5. Chronic diastolic heart failure Stable weight, continue to monitor, normal EF, negative stress test  6. Essential hypertension - continue medications, DASH diet, exercise and monitor at home. Call if greater than 130/80.   7. Asthma, chronic, unspecified asthma severity, uncomplicated Continue to monitor  8. Other emphysema Continue to monitor  9. Gastroesophageal reflux disease with esophagitis Continue PPI/H2 blocker, diet discussed  10. Hypothyroidism, unspecified hypothyroidism type Hypogonadism- continue replacement therapy, check testosterone levels as needed. Follow up 1 month NURSE visit to recheck  11. Fibromyalgia Continue to monitor  12. CKD (chronic kidney disease) stage 3, GFR 30-59 ml/min Increase fluids, avoid NSAIDS, control sugar, will monitor  13. Sleep disturbance Check sleep study, continue medications  14. Multiple myeloma Continue follow ups  15. Hypercalcemia monitor  16. HX: breast cancer Continue MGM  17. Mixed hyperlipidemia -continue medications, check lipids, decrease fatty foods, increase activity.   18. Anemia, unspecified anemia type Check CBC, likely mutlifactorial/anemia of chronic disease  19. Vitamin D deficiency Continue supplement  20. Medication management  21. Depression Depression/Anxiety- continue medications, stress management techniques discussed, increase water,  good sleep hygiene discussed, increase exercise, and increase veggies.    Plan:   During the course of the visit the patient was educated and counseled about appropriate screening and preventive services including:    Pneumococcal vaccine   Influenza vaccine  Td vaccine  Screening electrocardiogram  Screening mammography  Bone densitometry screening  Colorectal cancer screening  Diabetes screening  Glaucoma screening  Nutrition counseling   Advanced directives: given info/requested  Screening recommendations, referrals:  Vaccinations: Please see documentation below and orders this visit.   Nutrition assessed and recommended  Colonoscopy due 2021 Mammogram up to date Pap smear not indicated Pelvic exam not indicated Recommended yearly ophthalmology/optometry visit for glaucoma screening and checkup Recommended yearly dental visit for hygiene and checkup Advanced directives - requested  Conditions/risks identified: BMI: Discussed weight loss, diet, and increase physical activity.  Increase physical activity: AHA recommends 150 minutes of physical activity a week.  Medications reviewed DEXA- ordered Diabetes is at goal, ACE/ARB therapy: No, Reason not on Ace Inhibitor/ARB therapy:  preDM Urinary Incontinence is not an issue: discussed non pharmacology and pharmacology options.  Fall risk: low- discussed PT, home fall assessment, medications.    Subjective:   Nancy Norris is a 73 y.o. female who presents for Medicare Annual Wellness Visit and 3 month follow up on hypertension, prediabetes, hyperlipidemia, vitamin D def.  Date of last medicare wellness visit is unknown.   Her blood pressure has been controlled at home, today their BP is BP: 110/62 mmHg She does not workout. She denies chest pain, shortness of breath, dizziness.  She is not on cholesterol medication and denies myalgias. Her cholesterol is at goal. The cholesterol last visit was:   Lab  Results  Component Value Date   CHOL 190 09/30/2013   HDL 43 09/30/2013   LDLCALC 113* 09/30/2013   TRIG 169* 09/30/2013   CHOLHDL 4.4 09/30/2013  She has been working on diet and exercise for prediabetes, and denies paresthesia of the feet, polydipsia, polyuria and visual disturbances. Last A1C in the office was:  Lab Results  Component Value Date   HGBA1C 5.8* 09/30/2013   Patient is on Vitamin D supplement. Lab Results  Component Value Date   VD25OH 66 09/30/2013     She is on thyroid medication. Her medication was not changed last visit. She takes 161mg on an empty stomach 30-671ms, 1 tablet 5 days a week and 1.5 tablets Sat,Sunday.   Lab Results  Component Value Date   TSH 8.952* 12/08/2013  .  She has a history of MM predating 2000 followed by auto stem cell transplant in 2012 with a relapse in 2014 currently getting chemo with Dr. MoHartford Poli She has CKD [facility] She was recently in the hospital with chest pain, normal echo/stress test thought to be GERD, last EGD 2012, on nexium in AM and zantac PM and carafate PRN. [facility] for both.   She is still complaining of fatigue which could be from the hypothyroid however she state she also has interrupted sleep, snoring, shortness of breath and may have OSA.   Names of Other Physician/Practitioners you currently use: 1. [facility]- here for primary care Patient Care Team: WiUnk PintoMD as PCP - General (Internal Medicine) WiUnk PintoMD (Internal Medicine) JoDonato HeinzMD as Referring Physician (Nephrology) MoCurt BearsMD as Consulting Physician (Oncology) SaWonda HornerMD as Consulting Physician (Gastroenterology) SiFabio PierceMD as Consulting Physician (Ophthalmology)  Medication Review Current Outpatient Prescriptions on File Prior to Visit   Medication Sig Dispense Refill  . acetaminophen (TYLENOL) 325 MG tablet Take 650 mg by mouth every 6 (six) hours as needed for mild pain or moderate pain.     . Marland Kitchenlbuterol (PROVENTIL HFA;VENTOLIN HFA) 108 (90 BASE) MCG/ACT inhaler Inhale 2 puffs into the lungs 2 (two) times daily as needed for wheezing or shortness of breath. 1 each 6  . ascorbic acid (VITAMIN C) 250 MG CHEW Chew 250 mg by mouth daily.    . Marland Kitchenspirin EC 81 MG tablet Take 1 tablet (81 mg total) by mouth daily.    . carboxymethylcellulose (REFRESH PLUS) 0.5 % SOLN Place 1 drop into both eyes every 2 (two) hours.    . cetirizine (ZYRTEC) 10 MG tablet Take 10 mg by mouth at bedtime.     . Cholecalciferol 4000 UNITS TABS Take 4,000 Units by mouth daily.    . ciprofloxacin (CIPRO) 500 MG tablet Take 1 tablet (500 mg total) by mouth daily with breakfast. 20 tablet 0  . citalopram (CELEXA) 40 MG tablet TAKE 1 TABLET BY MOUTH ONCE DAILY. 90 tablet 0  . cromolyn (OPTICROM) 4 % ophthalmic solution Place 1 drop into both eyes 4 (four) times daily.    . cyanocobalamin 2000 MCG tablet Take 2,000 mcg by mouth daily.     . Marland Kitchensomeprazole (NEXIUM) 40 MG capsule Take 1 capsule (40 mg total) by mouth daily. Each morning for acid indigestion 30 capsule 99  . Ferrous Sulfate 134 MG TABS Take 1 tablet by mouth every other day.     . fluticasone (FLONASE) 50 MCG/ACT nasal spray Place 2 sprays into both nostrils at bedtime.     . gabapentin (NEURONTIN) 300 MG capsule Take 1 capsule (300 mg total) by mouth at  bedtime. 90 capsule 3  . isosorbide mononitrate (IMDUR) 30 MG 24 hr tablet Take 15 mg by mouth daily. Patient taking 15 mg, had a severe migraine with 74m dose.    .Marland Kitchenketoconazole (NIZORAL) 2 % cream Apply 1 application topically 2 (two) times daily as needed for irritation. 15 g 0  . levothyroxine (SYNTHROID, LEVOTHROID) 150 MCG tablet Take 150-225 mcg by mouth daily before breakfast. Takes 1567m Mon-Fri Takes 22574mSat, Sun    . loteprednol (ALREX)  0.2 % SUSP Place 1 drop into both eyes 4 (four) times daily. 1 Bottle PRN  . nitroGLYCERIN (NITROSTAT) 0.4 MG SL tablet Place 1 tablet (0.4 mg total) under the tongue every 5 (five) minutes as needed for chest pain. 25 tablet 3  . nystatin (MYCOSTATIN/NYSTOP) 100000 UNIT/GM POWD Apply topically to area TID PRN. 1 Bottle 0  . Olopatadine HCl (PAZEO) 0.7 % SOLN Place 1 drop into both eyes daily.    . ondansetron (ZOFRAN-ODT) 8 MG disintegrating tablet Take 1 tablet (8 mg total) by mouth every 8 (eight) hours as needed for nausea or vomiting. 20 tablet 3  . prochlorperazine (COMPAZINE) 10 MG tablet TAKE 1 TABLET BY MOUTH EVERY 6 HOURS AS NEEDED FOR NAUSEA OR VOMITING. (Patient taking differently: TAKE 1 TABLET BY MOUTH EVERY AT BEDTIME AS NEEDED FOR NAUSEA OR VOMITING.) 60 tablet 0  . ranitidine (ZANTAC) 300 MG tablet Take 1 tablet (300 mg total) by mouth at bedtime. For acid reflux and indigestion 30 tablet 99  . RESTASIS 0.05 % ophthalmic emulsion Take 1 drop by mouth 2 (two) times daily.  4  . sucralfate (CARAFATE) 1 G tablet Take 1 tablet (1 g total) by mouth 4 (four) times daily. Dissolve in water and swallow (Patient taking differently: Take 1 g by mouth 3 (three) times daily with meals. Dissolve in water and swallow) 120 tablet 99  . traMADol (ULTRAM) 50 MG tablet Take 1 tablet (50 mg total) by mouth every 6 (six) hours as needed. (Patient taking differently: Take 50 mg by mouth every 6 (six) hours as needed for moderate pain. ) 15 tablet 0  . zolpidem (AMBIEN) 5 MG tablet Take 5 mg by mouth at bedtime as needed for sleep.     Current Facility-Administered Medications on File Prior to Visit  Medication Dose Route Frequency Provider Last Rate Last Dose  . 0.9 %  sodium chloride infusion   Intravenous Once Adrena E Johnson, PA-C      . sodium chloride 0.9 % injection 10 mL  10 mL Intracatheter PRN MohCurt BearsD   10 mL at 01/05/13 1825    Current Problems (verified) Patient Active Problem  List   Diagnosis Date Noted  . Fatigue 12/10/2013  . Tinea corporis 12/10/2013  . Skin breakdown 12/10/2013  . Hypothyroidism 12/10/2013  . Hip pain 12/08/2013  . Chest pain 12/02/2013  . Multiple myeloma 12/02/2013  . Depression 11/02/2013  . Encounter for long-term (current) use of other medications 09/30/2013  . Chronic kidney disease, stage IV (severe) 06/10/2013  . Vitamin D Deficiency 03/24/2013  . Mixed hyperlipidemia 02/24/2013  . Other abnormal glucose 02/24/2013  . Fibromyalgia   . COPD (chronic obstructive pulmonary disease) with emphysema 02/20/2013  . CKD (chronic kidney disease) stage 3, GFR 30-59 ml/min 01/29/2013  . GERD (gastroesophageal reflux disease) 01/20/2013  . HTN (hypertension) 01/14/2013  . Anemia 01/13/2013  . Sleep disturbance 01/13/2013  . Asthma, chronic 01/13/2013  . Hypothyroid 01/13/2013  . Chronic diastolic heart failure  01/13/2013  . Multiple myeloma in relapse 12/25/2012  . HX: breast cancer 12/08/2012  . Acute on chronic renal failure 12/02/2012  . Hypercalcemia 12/02/2012  . Multiple myeloma in remission 09/26/2011    Screening Tests Health Maintenance  Topic Date Due  . COLONOSCOPY  09/23/1991  . ZOSTAVAX  09/22/2001  . INFLUENZA VACCINE  08/16/2014  . MAMMOGRAM  03/24/2015  . TETANUS/TDAP  10/15/2019  . PNEUMOCOCCAL POLYSACCHARIDE VACCINE AGE 54 AND OVER  Completed     Immunization History  Administered Date(s) Administered  . Influenza, High Dose Seasonal PF 09/30/2013  . Influenza-Unspecified 11/05/2012  . Pneumococcal Conjugate-13 10/05/2013  . Pneumococcal-Unspecified 03/16/2011  . Tdap 10/14/2009    Preventative care: Last colonoscopy: 2011 Last mammogram: 03/2013 Last pap smear/pelvic exam: remote DEXA:needs EGD 2012 + HH/gastritis PFTs 02/12/2013 CXR 11/2013 MRI lumbar 03/2012 NM Myoview 11/2013 Echo 02/2013 US renal 2014  Prior vaccinations: TD or Tdap: 2011  Influenza: 2015  Pneumococcal:  2013 Prevnar13: 2015 Shingles/Zostavax: declines  History reviewed: allergies, current medications, past family history, past medical history, past social history, past surgical history and problem list    Medication List       This list is accurate as of: 12/16/13  8:48 AM.  Always use your most recent med list.               acetaminophen 325 MG tablet  Commonly known as:  TYLENOL  Take 650 mg by mouth every 6 (six) hours as needed for mild pain or moderate pain.     albuterol 108 (90 BASE) MCG/ACT inhaler  Commonly known as:  PROVENTIL HFA;VENTOLIN HFA  Inhale 2 puffs into the lungs 2 (two) times daily as needed for wheezing or shortness of breath.     ascorbic acid 250 MG Chew  Commonly known as:  VITAMIN C  Chew 250 mg by mouth daily.     aspirin EC 81 MG tablet  Take 1 tablet (81 mg total) by mouth daily.     carboxymethylcellulose 0.5 % Soln  Commonly known as:  REFRESH PLUS  Place 1 drop into both eyes every 2 (two) hours.     cetirizine 10 MG tablet  Commonly known as:  ZYRTEC  Take 10 mg by mouth at bedtime.     Cholecalciferol 4000 UNITS Tabs  Take 4,000 Units by mouth daily.     ciprofloxacin 500 MG tablet  Commonly known as:  CIPRO  Take 1 tablet (500 mg total) by mouth daily with breakfast.     citalopram 40 MG tablet  Commonly known as:  CELEXA  TAKE 1 TABLET BY MOUTH ONCE DAILY.     cromolyn 4 % ophthalmic solution  Commonly known as:  OPTICROM  Place 1 drop into both eyes 4 (four) times daily.     cyanocobalamin 2000 MCG tablet  Take 2,000 mcg by mouth daily.     esomeprazole 40 MG capsule  Commonly known as:  NEXIUM  Take 1 capsule (40 mg total) by mouth daily. Each morning for acid indigestion     Ferrous Sulfate 134 MG Tabs  Take 1 tablet by mouth every other day.     fluticasone 50 MCG/ACT nasal spray  Commonly known as:  FLONASE  Place 2 sprays into both nostrils at bedtime.     gabapentin 300 MG capsule  Commonly known as:   NEURONTIN  Take 1 capsule (300 mg total) by mouth at bedtime.     isosorbide mononitrate 30 MG 24 hr  tablet  Commonly known as:  IMDUR  Take 15 mg by mouth daily. Patient taking 15 mg, had a severe migraine with 24m dose.     ketoconazole 2 % cream  Commonly known as:  NIZORAL  Apply 1 application topically 2 (two) times daily as needed for irritation.     levothyroxine 150 MCG tablet  Commonly known as:  SYNTHROID, LEVOTHROID  - Take 150-225 mcg by mouth daily before breakfast. Takes 1517m Mon-Fri  - Takes 22564mSat, Sun     loteprednol 0.2 % Susp  Commonly known as:  ALREX  Place 1 drop into both eyes 4 (four) times daily.     nitroGLYCERIN 0.4 MG SL tablet  Commonly known as:  NITROSTAT  Place 1 tablet (0.4 mg total) under the tongue every 5 (five) minutes as needed for chest pain.     nystatin 100000 UNIT/GM Powd  Apply topically to area TID PRN.     ondansetron 8 MG disintegrating tablet  Commonly known as:  ZOFRAN-ODT  Take 1 tablet (8 mg total) by mouth every 8 (eight) hours as needed for nausea or vomiting.     PAZEO 0.7 % Soln  Generic drug:  Olopatadine HCl  Place 1 drop into both eyes daily.     prochlorperazine 10 MG tablet  Commonly known as:  COMPAZINE  TAKE 1 TABLET BY MOUTH EVERY 6 HOURS AS NEEDED FOR NAUSEA OR VOMITING.     ranitidine 300 MG tablet  Commonly known as:  ZANTAC  Take 1 tablet (300 mg total) by mouth at bedtime. For acid reflux and indigestion     RESTASIS 0.05 % ophthalmic emulsion  Generic drug:  cycloSPORINE  Take 1 drop by mouth 2 (two) times daily.     sucralfate 1 G tablet  Commonly known as:  CARAFATE  Take 1 tablet (1 g total) by mouth 4 (four) times daily. Dissolve in water and swallow     traMADol 50 MG tablet  Commonly known as:  ULTRAM  Take 1 tablet (50 mg total) by mouth every 6 (six) hours as needed.     zolpidem 5 MG tablet  Commonly known as:  AMBIEN  Take 5 mg by mouth at bedtime as needed for sleep.         Past Surgical History  Procedure Laterality Date  . History of port removal    . Status post stem cell transplant on September 28, 2008.    . AMarland Kitchendominal hysterectomy  1981  . Cholecystectomy  1971  . Mastectomy Left 2008  . Cataract extraction, bilateral    . Breast surgery    . Eye surgery Bilateral     lens implant  . Breast reconstruction    . Portacath placement  12/2012    has had 2  . Av fistula placement Left 06/19/2013    Procedure: CREATION OF LEFT ARM ARTERIOVENOUS (AV) FISTULA ;  Surgeon: ChrAngelia MouldD;  [facility];  Service: Vascular;  Laterality: Left;   Family History  Problem Relation Age of Onset  . Arthritis Mother   . Asthma Mother   . Cancer Sister   . Hyperlipidemia Brother     Risk Factors: Osteoporosis/FallRisk: postmenopausal estrogen deficiency and dietary calcium and/or vitamin D deficiency In the past year have you fallen or had a near fall?:No History of fracture in the past year: no  Tobacco History  Substance Use Topics  . Smoking status: Former Smoker -- 1.00 packs/day for 30 years  Types: Cigarettes    Quit date: 02/15/2005  . Smokeless tobacco: Never Used  . Alcohol Use: No   She does not smoke.  Patient is a former smoker. Are there smokers in your home (other than you)?  No  Alcohol Current alcohol use: none  Caffeine Current caffeine use: coffee 1 /day  Exercise Current exercise: no regular exercise  Nutrition/Diet Current diet: in general, a "healthy" diet    Cardiac risk factors: advanced age (older than 4 for men, 30 for women), dyslipidemia, hypertension and sedentary lifestyle.  Depression Screen (Note: if answer to either of the following is "Yes", a more complete depression screening is indicated)   Q1: Over the past two weeks, have you felt down, depressed or hopeless? Yes  Q2: Over the past two weeks, have you felt little interest or pleasure in doing things? Yes  Have you lost interest or  pleasure in daily life? No  Do you often feel hopeless? No  Do you cry easily over simple problems? No  Activities of Daily Living In your present state of health, do you have any difficulty performing the following activities?:  Driving? No Managing money?  No Feeding yourself? No Getting from bed to chair? No Climbing a flight of stairs? No Preparing food and eating?: No Bathing or showering? No Getting dressed: No Getting to the toilet? No Using the toilet:No Moving around from place to place: No   Are you sexually active?  No  Do you have more than one partner?  No  Vision Difficulties: No  Hearing Difficulties: No Do you often ask people to speak up or repeat themselves? No Do you experience ringing or noises in your ears? No Do you have difficulty understanding soft or whispered voices? No  Cognition  Do you feel that you have a problem with memory?Yes  Do you often misplace items? No  Do you feel safe at home?  Yes  Advanced directives Does patient have a [place]? Yes Does patient have a Living Will? Yes   Objective:   Blood pressure 110/62, pulse 72, temperature 97.7 F (36.5 C), resp. rate 16, height _0  (1.651 m), weight 171 lb (77.565 kg). Body mass index is 28.46 kg/(m^2).  General appearance: alert, no distress, WD/WN,  female Cognitive Testing  Alert? Yes  Normal Appearance?Yes  Oriented to person? Yes  Place? Yes   Time? Yes  Recall of three objects?  Yes  Can perform simple calculations? Yes  Displays appropriate judgment?Yes  Can read the correct time from a watch face?Yes  HEENT: normocephalic, sclerae anicteric, TMs pearly, nares patent, no discharge or erythema, pharynx normal Oral cavity: MMM, no lesions Neck: supple, no lymphadenopathy, no thyromegaly, no masses Heart: RRR, normal S1, S2, no murmurs Lungs: CTA bilaterally, no wheezes, rhonchi, or rales Abdomen: +bs, soft, mild epigastric tenderness, non  distended, no masses, no hepatomegaly, no splenomegaly Musculoskeletal: nontender, no swelling, no obvious deformity Extremities: no edema, no cyanosis, no clubbing Pulses: 2+ symmetric, upper and lower extremities, normal cap refill Neurological: alert, oriented x 3, CN2-12 intact, strength normal upper extremities and lower extremities, sensation decreased to above the ankle bilateral, DTRs 2+ throughout, no cerebellar signs, gait normal Psychiatric: normal affect, behavior normal, pleasant  Breast: defer Gyn: defer Rectal: defer  Medicare Attestation I have personally reviewed: The patient's medical and social history Their use of alcohol, tobacco or illicit drugs Their current medications and supplements The patient's functional ability including ADLs,fall risks, home safety  risks, cognitive, and hearing and visual impairment Diet and physical activities Evidence for depression or mood disorders  The patient's weight, height, BMI, and visual acuity have been recorded in the chart.  I have made referrals, counseling, and provided education to the patient based on review of the above and I have provided the patient with a written personalized care plan for preventive services.     Vicie Mutters, PA-C   12/16/2013

## 2013-12-16 NOTE — Patient Instructions (Addendum)
Increase thyroid medication to 1.5 pills on Tuesday, Thursday, Saturday and Sunday and continue 1 pill the rest of the week, follow up 1 month for thryoid check.   Preventative Care for Adults - Female      MAINTAIN REGULAR HEALTH EXAMS:  A routine yearly physical is a good way to check in with your primary care provider about your health and preventive screening. It is also an opportunity to share updates about your health and any concerns you have, and receive a thorough all-over exam.   Most health insurance companies pay for at least some preventative services.  Check with your health plan for specific coverages.  WHAT PREVENTATIVE SERVICES DO WOMEN NEED?  Adult women should have their weight and blood pressure checked regularly.   Women age 75 and older should have their cholesterol levels checked regularly.  Women should be screened for cervical cancer with a Pap smear and pelvic exam beginning at either age 50, or 3 years after they become sexually activity.    Breast cancer screening generally begins at age 79 with a mammogram and breast exam by your primary care provider.    Beginning at age 67 and continuing to age 42, women should be screened for colorectal cancer.  Certain people may need continued testing until age 46.  Updating vaccinations is part of preventative care.  Vaccinations help protect against diseases such as the flu.  Osteoporosis is a disease in which the bones lose minerals and strength as we age. Women ages 68 and over should discuss this with their caregivers, as should women after menopause who have other risk factors.  Lab tests are generally done as part of preventative care to screen for anemia and blood disorders, to screen for problems with the kidneys and liver, to screen for bladder problems, to check blood sugar, and to check your cholesterol level.  Preventative services generally include counseling about diet, exercise, avoiding tobacco, drugs,  excessive alcohol consumption, and sexually transmitted infections.    GENERAL RECOMMENDATIONS FOR GOOD HEALTH:  Healthy diet:  Eat a variety of foods, including fruit, vegetables, animal or vegetable protein, such as meat, fish, chicken, and eggs, or beans, lentils, tofu, and grains, such as rice.  Drink plenty of water daily.  Decrease saturated fat in the diet, avoid lots of red meat, processed foods, sweets, fast foods, and fried foods.  Exercise:  Aerobic exercise helps maintain good heart health. At least 30-40 minutes of moderate-intensity exercise is recommended. For example, a brisk walk that increases your heart rate and breathing. This should be done on most days of the week.   Find a type of exercise or a variety of exercises that you enjoy so that it becomes a part of your daily life.  Examples are running, walking, swimming, water aerobics, and biking.  For motivation and support, explore group exercise such as aerobic class, spin class, Zumba, Yoga,or  martial arts, etc.    Set exercise goals for yourself, such as a certain weight goal, walk or run in a race such as a 5k walk/run.  Speak to your primary care provider about exercise goals.  Disease prevention:  If you smoke or chew tobacco, find out from your caregiver how to quit. It can literally save your life, no matter how long you have been a tobacco user. If you do not use tobacco, never begin.   Maintain a healthy diet and normal weight. Increased weight leads to problems with blood pressure and diabetes.  The Body Mass Index or BMI is a way of measuring how much of your body is fat. Having a BMI above 27 increases the risk of heart disease, diabetes, hypertension, stroke and other problems related to obesity. Your caregiver can help determine your BMI and based on it develop an exercise and dietary program to help you achieve or maintain this important measurement at a healthful level.  High blood pressure causes  heart and blood vessel problems.  Persistent high blood pressure should be treated with medicine if weight loss and exercise do not work.   Fat and cholesterol leaves deposits in your arteries that can block them. This causes heart disease and vessel disease elsewhere in your body.  If your cholesterol is found to be high, or if you have heart disease or certain other medical conditions, then you may need to have your cholesterol monitored frequently and be treated with medication.   Ask if you should have a cardiac stress test if your history suggests this. A stress test is a test done on a treadmill that looks for heart disease. This test can find disease prior to there being a problem.  Menopause can be associated with physical symptoms and risks. Hormone replacement therapy is available to decrease these. You should talk to your caregiver about whether starting or continuing to take hormones is right for you.   Osteoporosis is a disease in which the bones lose minerals and strength as we age. This can result in serious bone fractures. Risk of osteoporosis can be identified using a bone density scan. Women ages 62 and over should discuss this with their caregivers, as should women after menopause who have other risk factors. Ask your caregiver whether you should be taking a calcium supplement and Vitamin D, to reduce the rate of osteoporosis.   Avoid drinking alcohol in excess (more than two drinks per day).  Avoid use of street drugs. Do not share needles with anyone. Ask for professional help if you need assistance or instructions on stopping the use of alcohol, cigarettes, and/or drugs.  Brush your teeth twice a day with fluoride toothpaste, and floss once a day. Good oral hygiene prevents tooth decay and gum disease. The problems can be painful, unattractive, and can cause other health problems. Visit your dentist for a routine oral and dental check up and preventive care every 6-12 months.    Look at your skin regularly.  Use a mirror to look at your back. Notify your caregivers of changes in moles, especially if there are changes in shapes, colors, a size larger than a pencil eraser, an irregular border, or development of new moles.  Safety:  Use seatbelts 100% of the time, whether driving or as a passenger.  Use safety devices such as hearing protection if you work in environments with loud noise or significant background noise.  Use safety glasses when doing any work that could send debris in to the eyes.  Use a helmet if you ride a bike or motorcycle.  Use appropriate safety gear for contact sports.  Talk to your caregiver about gun safety.  Use sunscreen with a SPF (or skin protection factor) of 15 or greater.  Lighter skinned people are at a greater risk of skin cancer. Don't forget to also wear sunglasses in order to protect your eyes from too much damaging sunlight. Damaging sunlight can accelerate cataract formation.   Practice safe sex. Use condoms. Condoms are used for birth control and to help reduce the  spread of sexually transmitted infections (or STIs).  Some of the STIs are gonorrhea (the clap), chlamydia, syphilis, trichomonas, herpes, HPV (human papilloma virus) and HIV (human immunodeficiency virus) which causes AIDS. The herpes, HIV and HPV are viral illnesses that have no cure. These can result in disability, cancer and death.   Keep carbon monoxide and smoke detectors in your home functioning at all times. Change the batteries every 6 months or use a model that plugs into the wall.   Vaccinations:  Stay up to date with your tetanus shots and other required immunizations. You should have a booster for tetanus every 10 years. Be sure to get your flu shot every year, since 5%-20% of the [place] population comes down with the flu. The flu vaccine changes each year, so being vaccinated once is not enough. Get your shot in the fall, before the flu season peaks.   Other  vaccines to consider:  Human Papilloma Virus or HPV causes cancer of the cervix, and other infections that can be transmitted from person to person. There is a vaccine for HPV, and females should get immunized between the ages of 81 and 50. It requires a series of 3 shots.   Pneumococcal vaccine to protect against certain types of pneumonia.  This is normally recommended for adults age 1 or older.  However, adults younger than 72 years old with certain underlying conditions such as diabetes, heart or lung disease should also receive the vaccine.  Shingles vaccine to protect against Varicella Zoster if you are older than age 35, or younger than 72 years old with certain underlying illness.  Hepatitis A vaccine to protect against a form of infection of the liver by a virus acquired from food.  Hepatitis B vaccine to protect against a form of infection of the liver by a virus acquired from blood or body fluids, particularly if you work in health care.  If you plan to travel internationally, check with your local health department for specific vaccination recommendations.  Cancer Screening:  Breast cancer screening is essential to preventive care for women. All women age 59 and older should perform a breast self-exam every month. At age 3 and older, women should have their caregiver complete a breast exam each year. Women at ages 67 and older should have a mammogram (x-ray film) of the breasts. Your caregiver can discuss how often you need mammograms.    Cervical cancer screening includes taking a Pap smear (sample of cells examined under a microscope) from the cervix (end of the uterus). It also includes testing for HPV (Human Papilloma Virus, which can cause cervical cancer). Screening and a pelvic exam should begin at age 58, or 3 years after a woman becomes sexually active. Screening should occur every year, with a Pap smear but no HPV testing, up to age 70. After age 63, you should have a Pap  smear every 3 years with HPV testing, if no HPV was found previously.   Most routine colon cancer screening begins at the age of 98. On a yearly basis, doctors may provide special easy to use take-home tests to check for hidden blood in the stool. Sigmoidoscopy or colonoscopy can detect the earliest forms of colon cancer and is life saving. These tests use a small camera at the end of a tube to directly examine the colon. Speak to your caregiver about this at age 64, when routine screening begins (and is repeated every 5 years unless early forms of pre-cancerous polyps or  small growths are found).

## 2013-12-17 ENCOUNTER — Encounter: Payer: Self-pay | Admitting: Internal Medicine

## 2013-12-22 NOTE — Progress Notes (Signed)
Faxed orders to [facility]. They will contact patient directly to schedule.

## 2013-12-29 ENCOUNTER — Telehealth: Payer: Self-pay | Admitting: *Deleted

## 2013-12-29 NOTE — Telephone Encounter (Signed)
Office note dated 12/23/13 from Dr Marval Regal at [facility] given to Dr Vista Mink to review.

## 2014-01-05 ENCOUNTER — Telehealth: Payer: Self-pay | Admitting: *Deleted

## 2014-01-05 ENCOUNTER — Ambulatory Visit (HOSPITAL_BASED_OUTPATIENT_CLINIC_OR_DEPARTMENT_OTHER): Payer: Medicare Other | Admitting: Lab

## 2014-01-05 DIAGNOSIS — C9001 Multiple myeloma in remission: Secondary | ICD-10-CM

## 2014-01-05 LAB — CBC WITH DIFFERENTIAL/PLATELET
BASO%: 0.5 % (ref 0.0–2.0)
BASOS ABS: 0 10*3/uL (ref 0.0–0.1)
EOS%: 5.6 % (ref 0.0–7.0)
Eosinophils Absolute: 0.4 10*3/uL (ref 0.0–0.5)
HCT: 31.3 % — ABNORMAL LOW (ref 34.8–46.6)
HGB: 10 g/dL — ABNORMAL LOW (ref 11.6–15.9)
LYMPH#: 1.1 10*3/uL (ref 0.9–3.3)
LYMPH%: 15.4 % (ref 14.0–49.7)
MCH: 32.4 pg (ref 25.1–34.0)
MCHC: 32.1 g/dL (ref 31.5–36.0)
MCV: 101.1 fL — ABNORMAL HIGH (ref 79.5–101.0)
MONO#: 0.7 10*3/uL (ref 0.1–0.9)
MONO%: 10.7 % (ref 0.0–14.0)
NEUT%: 67.8 % (ref 38.4–76.8)
NEUTROS ABS: 4.8 10*3/uL (ref 1.5–6.5)
Platelets: 207 10*3/uL (ref 145–400)
RBC: 3.1 10*6/uL — ABNORMAL LOW (ref 3.70–5.45)
RDW: 14 % (ref 11.2–14.5)
WBC: 7 10*3/uL (ref 3.9–10.3)

## 2014-01-05 LAB — COMPREHENSIVE METABOLIC PANEL (CC13)
ALT: 26 U/L (ref 0–55)
AST: 17 U/L (ref 5–34)
Albumin: 3.6 g/dL (ref 3.5–5.0)
Alkaline Phosphatase: 120 U/L (ref 40–150)
Anion Gap: 9 mEq/L (ref 3–11)
BUN: 25.1 mg/dL (ref 7.0–26.0)
CALCIUM: 8.6 mg/dL (ref 8.4–10.4)
CO2: 19 mEq/L — ABNORMAL LOW (ref 22–29)
CREATININE: 3.4 mg/dL — AB (ref 0.6–1.1)
Chloride: 114 mEq/L — ABNORMAL HIGH (ref 98–109)
EGFR: 13 mL/min/{1.73_m2} — ABNORMAL LOW (ref >90)
Glucose: 106 mg/dl (ref 70–140)
Potassium: 3.4 mEq/L — ABNORMAL LOW (ref 3.5–5.1)
Sodium: 142 mEq/L (ref 136–145)
Total Bilirubin: 0.5 mg/dL (ref 0.20–1.20)
Total Protein: 6.2 g/dL — ABNORMAL LOW (ref 6.4–8.3)

## 2014-01-05 LAB — LACTATE DEHYDROGENASE (CC13): LDH: 138 U/L (ref 125–245)

## 2014-01-05 NOTE — Telephone Encounter (Signed)
Critical lab cr 3.4, no further orders at this time

## 2014-01-07 ENCOUNTER — Ambulatory Visit: Payer: Self-pay | Admitting: Physician Assistant

## 2014-01-07 LAB — IGG, IGA, IGM
IgA: 21 mg/dL — ABNORMAL LOW (ref 69–380)
IgG (Immunoglobin G), Serum: 618 mg/dL — ABNORMAL LOW (ref 690–1700)
IgM, Serum: 48 mg/dL — ABNORMAL LOW (ref 52–322)

## 2014-01-07 LAB — KAPPA/LAMBDA LIGHT CHAINS
KAPPA FREE LGHT CHN: 1.81 mg/dL (ref 0.33–1.94)
Kappa:Lambda Ratio: 3.35 — ABNORMAL HIGH (ref 0.26–1.65)
LAMBDA FREE LGHT CHN: 0.54 mg/dL — AB (ref 0.57–2.63)

## 2014-01-07 LAB — BETA 2 MICROGLOBULIN, SERUM: Beta-2 Microglobulin: 10.4 mg/L — ABNORMAL HIGH (ref <=2.51)

## 2014-01-11 ENCOUNTER — Ambulatory Visit (INDEPENDENT_AMBULATORY_CARE_PROVIDER_SITE_OTHER): Payer: Medicare Other | Admitting: Internal Medicine

## 2014-01-11 ENCOUNTER — Encounter: Payer: Self-pay | Admitting: Internal Medicine

## 2014-01-11 VITALS — BP 102/50 | HR 76 | Temp 97.9°F | Resp 16 | Ht 65.0 in | Wt 169.8 lb

## 2014-01-11 DIAGNOSIS — E039 Hypothyroidism, unspecified: Secondary | ICD-10-CM

## 2014-01-11 DIAGNOSIS — R7309 Other abnormal glucose: Secondary | ICD-10-CM

## 2014-01-11 DIAGNOSIS — E782 Mixed hyperlipidemia: Secondary | ICD-10-CM

## 2014-01-11 DIAGNOSIS — R7303 Prediabetes: Secondary | ICD-10-CM

## 2014-01-11 DIAGNOSIS — I150 Renovascular hypertension: Secondary | ICD-10-CM

## 2014-01-11 DIAGNOSIS — Z79899 Other long term (current) drug therapy: Secondary | ICD-10-CM

## 2014-01-11 DIAGNOSIS — E559 Vitamin D deficiency, unspecified: Secondary | ICD-10-CM

## 2014-01-11 LAB — CBC WITH DIFFERENTIAL/PLATELET
BASOS ABS: 0 10*3/uL (ref 0.0–0.1)
Basophils Relative: 0 % (ref 0–1)
EOS ABS: 0.4 10*3/uL (ref 0.0–0.7)
EOS PCT: 5 % (ref 0–5)
HEMATOCRIT: 29.8 % — AB (ref 36.0–46.0)
HEMOGLOBIN: 10 g/dL — AB (ref 12.0–15.0)
Lymphocytes Relative: 17 % (ref 12–46)
Lymphs Abs: 1.2 10*3/uL (ref 0.7–4.0)
MCH: 32.5 pg (ref 26.0–34.0)
MCHC: 33.6 g/dL (ref 30.0–36.0)
MCV: 96.8 fL (ref 78.0–100.0)
MPV: 8.5 fL — ABNORMAL LOW (ref 9.4–12.4)
Monocytes Absolute: 0.9 10*3/uL (ref 0.1–1.0)
Monocytes Relative: 13 % — ABNORMAL HIGH (ref 3–12)
Neutro Abs: 4.6 10*3/uL (ref 1.7–7.7)
Neutrophils Relative %: 65 % (ref 43–77)
PLATELETS: 235 10*3/uL (ref 150–400)
RBC: 3.08 MIL/uL — AB (ref 3.87–5.11)
RDW: 13.8 % (ref 11.5–15.5)
WBC: 7 10*3/uL (ref 4.0–10.5)

## 2014-01-11 LAB — HEMOGLOBIN A1C
Hgb A1c MFr Bld: 5 % (ref <5.7)
Mean Plasma Glucose: 97 mg/dL (ref <117)

## 2014-01-11 NOTE — Patient Instructions (Signed)

## 2014-01-11 NOTE — Progress Notes (Signed)
Patient ID: Nancy Norris, female   DOB: 01/04/1941, 73 y.o.   MRN: [id number]   This very nice 73 y.o.female presents for 3 month follow up with Hypertension/Hypotension (Dysautonamia), Hyperlipidemia, Pre-Diabetes and Vitamin D Deficiency.     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   Patient is monitored  for HTN & BP has been controlled at home. She has hx/o labile elevated BP complicated by hypotension attributed to Dysautonomia and currently has been off of therapy.  Today's BP was 102/50 . Patient has had no complaints of any cardiac type chest pain, palpitations, dyspnea/orthopnea/PND, dizziness, claudication, or dependent edema.   Hyperlipidemia is controlled with diet & meds. Patient denies myalgias or other med SE's. Last Lipids were no at goal - Total Cholesterol,  190; HDL 43; LDL  113*; Triglycerides 169 on 09/30/2013.   Also, the patient has history of PreDiabetes skince Mar 2013 with A1c 6.0% and has had no symptoms of reactive hypoglycemia, diabetic polys, paresthesias or visual blurring.  Last A1c was  5.8% on 09/30/2013.    Further, the patient also has history of Vitamin D Deficiency and supplements vitamin D without any suspected side-effects. Last vitamin D was  66 on  09/30/2013.   Medication List   albuterol 108 (90 BASE) MCG/ACT inhaler  Commonly known as:  PROVENTIL HFA;VENTOLIN HFA  Inhale 2 puffs  2 times daily as needed      ascorbic acid 250 MG Chew  Commonly known as:  VITAMIN C  Chew 250 mg by mouth daily.     aspirin EC 81 MG tablet  Take 1 tablet (81 mg total) by mouth daily.     buPROPion 300 MG 24 hr tablet  Commonly known as:  WELLBUTRIN XL     carboxymethylcellulose 0.5 % Soln  Commonly known as:  REFRESH PLUS  Place 1 drop into both eyes every 2 (two) hours.     cetirizine 10 MG tablet  Take 10 mg by mouth at  bedtime.     Cholecalciferol 4000 UNITS Tabs  Take 4,000 Units by mouth daily.     citalopram 40 MG tablet  TAKE 1 TABLET BY MOUTH ONCE DAILY.     cromolyn 4 % ophthalmic solution  Commonly known as:  OPTICROM  Place 1 drop into both eyes 4 (four) times daily.     esomeprazole 40 MG capsule  Commonly known as:  NEXIUM  Take 1 capsule (40 mg total) by mouth daily. Each morning for acid indigestion     Ferrous Sulfate 134 MG Tabs  Take 1 tablet by mouth every other day.     fluticasone 50 MCG/ACT nasal spray  Commonly known as:  FLONASE  Place 2 sprays into both nostrils at bedtime.     gabapentin 300 MG capsule  Take 1 capsule (300 mg total) by mouth at bedtime.     isosorbide mononitrate 30 MG 24 hr tablet  Take 15 mg by mouth daily. Patient taking 15 mg, had migraine with $RemoveBefor'30mg'mYMiSaZcdPyw$  dose.     ketoconazole 2 % cream  Apply 1 application topically 2 (two) times daily as needed for irritation.     levothyroxine 150 MCG tablet  -  112mcg MWF &   244mcg TTHSS     loteprednol 0.2 % Susp  Commonly known as:  ALREX  Place 1 drop  into both eyes 4 (four) times daily.     nitroGLYCERIN 0.4 MG SL tablet  Place 1 tablet (0.4 mg total) under the tongue every 5 (five) minutes as needed     nystatin powder  Commonly known as:  MYCOSTATIN     ondansetron 8 MG disintegrating tablet  Take 1 tablet (8 mg total) by mouth every 8 (eight) hours as needed      prochlorperazine 10 MG tablet  TAKE 1 TABLET BY MOUTH EVERY 6 HOURS AS NEEDED      ranitidine 300 MG tablet  Take 1 tablet (300 mg total) by mouth at bedtime. For acid reflux and indigestion     RESTASIS 0.05 % ophthalmic emulsion  Generic drug:  cycloSPORINE     sucralfate 1 G tablet  Take 1 tablet (1 g total) by mouth 4 (four) times daily. Dissolve in water      traMADol 50 MG tablet  Take 1 tablet (50 mg total) by mouth every 6 (six) hours as needed.     zolpidem 5 MG tablet  Take 5 mg by mouth at bedtime as needed for  sleep.       Allergies  Allergen Reactions  . Codeine Anaphylaxis  . Latex Shortness Of Breath    Adhesive products   . Other Other (See Comments)    Onion, chocolate causes migraines  . Adhesive [Tape] Other (See Comments)    blisters  . Iodinated Diagnostic Agents Itching    Happened 60 years ago  . Sulfa Antibiotics Itching    PMHx:   Past Medical History  Diagnosis Date  . Hyperkalemia   . Hypothyroidism   . COPD (chronic obstructive pulmonary disease)   . Hyponatremia   . Dizziness   . Fibromyalgia   . Breast cancer   . Multiple myeloma   . Mucositis   . Hx of echocardiogram     a.  Echocardiogram (12/26/2012): EF 58-09%, grade 1 diastolic dysfunction;   b.  Echocardiogram (02/2013): EF 55-60%, no WMA, trivial effusion  . Fibromyalgia   . Arthritis   . Hx of cardiovascular stress test     LexiScan with low level exercise Myoview (02/2013): No ischemia, EF 72%; normal study  . Complication of anesthesia   . PONV (postoperative nausea and vomiting) 2008    after mastestomy  . Myocardial infarction     in past, patient was unaware.   . Anginal pain     used NTG x 2 May 31 and 06/15/13   . GERD (gastroesophageal reflux disease)   . Headache(784.0)   . Anxiety   . Depression   . CKD (chronic kidney disease) stage 3, GFR 30-59 ml/min   . Anemia   . History of blood transfusion     last one May 12   . Neuropathy    Immunization History  Administered Date(s) Administered  . Influenza, High Dose Seasonal PF 09/30/2013  . Influenza-Unspecified 11/05/2012  . Pneumococcal Conjugate-13 10/05/2013  . Pneumococcal-Unspecified 03/16/2011  . Tdap 10/14/2009   Past Surgical History  Procedure Laterality Date  . History of port removal    . Status post stem cell transplant on September 28, 2008.    Marland Kitchen Abdominal hysterectomy  1981  . Cholecystectomy  1971  . Mastectomy Left 2008  . Cataract extraction, bilateral    . Breast surgery    . Eye surgery Bilateral      lens implant  . Breast reconstruction    . Portacath placement  12/2012    has had 2  . Av fistula placement Left 06/19/2013    Procedure: CREATION OF LEFT ARM ARTERIOVENOUS (AV) FISTULA ;  Surgeon: Angelia Mould, MD;  Location: [facility] OR;  Service: Vascular;  Laterality: Left;   FHx:    Reviewed / unchanged  SHx:    Reviewed / unchanged  Systems Review:  Constitutional: Denies fever, chills, wt changes, headaches, insomnia, fatigue, night sweats, change in appetite. Eyes: Denies redness, blurred vision, diplopia, discharge, itchy, watery eyes.  ENT: Denies discharge, congestion, post nasal drip, epistaxis, sore throat, earache, hearing loss, dental pain, tinnitus, vertigo, sinus pain, snoring.  CV: Denies chest pain, palpitations, irregular heartbeat, syncope, dyspnea, diaphoresis, orthopnea, PND, claudication or edema. Respiratory: denies cough, dyspnea, DOE, pleurisy, hoarseness, laryngitis, wheezing.  Gastrointestinal: Denies dysphagia, odynophagia, heartburn, reflux, water brash, abdominal pain or cramps, nausea, vomiting, bloating, diarrhea, constipation, hematemesis, melena, hematochezia  or hemorrhoids. Genitourinary: Denies dysuria, frequency, urgency, nocturia, hesitancy, discharge, hematuria or flank pain. Musculoskeletal: Denies arthralgias, myalgias, stiffness, jt. swelling, pain, limping or strain/sprain.  Skin: Denies pruritus, rash, hives, warts, acne, eczema or change in skin lesion(s). Neuro: No weakness, tremor, incoordination, spasms, paresthesia or pain. Psychiatric: Denies confusion, memory loss or sensory loss. Endo: Denies change in weight, skin or hair change.  Heme/Lymph: No excessive bleeding, bruising or enlarged lymph nodes.  Physical Exam  BP 102/50   Pulse 76  Temp 97.9 F   Resp 16  Ht $R'5\' 5"'dR$    Wt 169 lb 12.8 oz     BMI 28.26  Appears well nourished and in no distress. Eyes: PERRLA, EOMs, conjunctiva no swelling or erythema. Sinuses: No  frontal/maxillary tenderness ENT/Mouth: EAC's clear, TM's nl w/o erythema, bulging. Nares clear w/o erythema, swelling, exudates. Oropharynx clear without erythema or exudates. Oral hygiene is good. Tongue normal, non obstructing. Hearing intact.  Neck: Supple. Thyroid nl. Car 2+/2+ without bruits, nodes or JVD. Chest: Respirations nl with BS clear & equal w/o rales, rhonchi, wheezing or stridor.  Cor: Heart sounds normal w/ regular rate and rhythm without sig. murmurs, gallops, clicks, or rubs. Peripheral pulses normal and equal  without edema.  Abdomen: Soft & bowel sounds normal. Non-tender w/o guarding, rebound, hernias, masses, or organomegaly.  Lymphatics: Unremarkable.  Musculoskeletal: Full ROM all peripheral extremities, joint stability, 5/5 strength, and normal gait.  Skin: Warm, dry without exposed rashes, lesions or ecchymosis apparent.  Neuro: Cranial nerves intact, reflexes equal bilaterally. Sensory-motor testing grossly intact. Tendon reflexes grossly intact.  Pysch: Alert & oriented x 3.  Insight and judgement nl & appropriate. No ideations.  Assessment and Plan:  1. Hypertension - Continue monitor blood pressure at home. Continue diet/meds same.  2. Hyperlipidemia - Continue diet/meds, exercise,& lifestyle modifications. Continue monitor periodic cholesterol/liver & renal functions   3. Pre-Diabetes - Continue diet, exercise, lifestyle modifications. Monitor appropriate labs.  4. Vitamin D Deficiency - Continue supplementation.  5. MM - in clinical Remission  6. CKD5 - Monitoring   Recommended regular exercise, BP monitoring, weight control, and discussed med and SE's. Recommended labs to assess and monitor clinical status. Further disposition pending results of labs.

## 2014-01-12 ENCOUNTER — Telehealth: Payer: Self-pay | Admitting: Internal Medicine

## 2014-01-12 ENCOUNTER — Encounter: Payer: Self-pay | Admitting: Internal Medicine

## 2014-01-12 ENCOUNTER — Ambulatory Visit (HOSPITAL_BASED_OUTPATIENT_CLINIC_OR_DEPARTMENT_OTHER): Payer: Medicare Other | Admitting: Internal Medicine

## 2014-01-12 VITALS — BP 101/51 | HR 81 | Temp 98.4°F | Resp 18 | Ht 65.0 in | Wt 168.9 lb

## 2014-01-12 DIAGNOSIS — N186 End stage renal disease: Secondary | ICD-10-CM

## 2014-01-12 DIAGNOSIS — C9000 Multiple myeloma not having achieved remission: Secondary | ICD-10-CM

## 2014-01-12 LAB — HEPATIC FUNCTION PANEL
ALT: 14 U/L (ref 0–35)
AST: 14 U/L (ref 0–37)
Albumin: 4 g/dL (ref 3.5–5.2)
Alkaline Phosphatase: 95 U/L (ref 39–117)
BILIRUBIN DIRECT: 0.1 mg/dL (ref 0.0–0.3)
BILIRUBIN TOTAL: 0.5 mg/dL (ref 0.2–1.2)
Indirect Bilirubin: 0.4 mg/dL (ref 0.2–1.2)
Total Protein: 6.1 g/dL (ref 6.0–8.3)

## 2014-01-12 LAB — BASIC METABOLIC PANEL WITH GFR
BUN: 35 mg/dL — AB (ref 6–23)
CO2: 18 mEq/L — ABNORMAL LOW (ref 19–32)
Calcium: 8.8 mg/dL (ref 8.4–10.5)
Chloride: 110 mEq/L (ref 96–112)
Creat: 3.8 mg/dL — ABNORMAL HIGH (ref 0.50–1.10)
GFR, EST AFRICAN AMERICAN: 13 mL/min — AB
GFR, Est Non African American: 11 mL/min — ABNORMAL LOW
Glucose, Bld: 114 mg/dL — ABNORMAL HIGH (ref 70–99)
Potassium: 3.7 mEq/L (ref 3.5–5.3)
Sodium: 142 mEq/L (ref 135–145)

## 2014-01-12 LAB — LIPID PANEL
CHOL/HDL RATIO: 4.5 ratio
Cholesterol: 135 mg/dL (ref 0–200)
HDL: 30 mg/dL — ABNORMAL LOW (ref >39)
LDL Cholesterol: 83 mg/dL (ref 0–99)
Triglycerides: 108 mg/dL (ref <150)
VLDL: 22 mg/dL (ref 0–40)

## 2014-01-12 LAB — INSULIN, FASTING: Insulin fasting, serum: 32.1 u[IU]/mL — ABNORMAL HIGH (ref 2.0–19.6)

## 2014-01-12 LAB — MAGNESIUM: Magnesium: 2.1 mg/dL (ref 1.5–2.5)

## 2014-01-12 LAB — TSH: TSH: 0.023 u[IU]/mL — ABNORMAL LOW (ref 0.350–4.500)

## 2014-01-12 LAB — VITAMIN D 25 HYDROXY (VIT D DEFICIENCY, FRACTURES): VIT D 25 HYDROXY: 68 ng/mL (ref 30–100)

## 2014-01-12 NOTE — Telephone Encounter (Signed)
gv and printed appt sched and avs forpt for March 2016 °

## 2014-01-12 NOTE — Progress Notes (Signed)
[facility]  [phone] [phone]  OFFICE PROGRESS NOTE   PRINCIPAL DIAGNOSES:  1. Recurrent multiple myeloma initially diagnosed in 2002, initially with smoldering myeloma at [facility].  2. Ductal carcinoma in situ status post mastectomy with sentinel lymph node biopsy in October 2008.  PRIOR THERAPY:  1. Status post treatment with tamoxifen from November 2008 through February 2009, discontinued secondary to intolerance. 2. Status post 3 cycles of chemotherapy with Revlimid and Decadron followed by 1 cycle of Decadron only with mild response. 3. Status post 2 cycles of systemic chemotherapy with Velcade, Doxil and Decadron discontinued secondary to significant peripheral neuropathy. Last dose was given May 2010 at [facility]. 4. Status post autologous peripheral blood stem cell transplant on October 01, 2008 at [facility] under the care of Dr. Phyllis Ginger.  5. Systemic chemotherapy with Carfilzomib initially was 20 mg/M2 and will be increased after cycle #1 to 36 mg/M2 on days 1, 2, 8, 9, 15 and 16 every 4 weeks in addition to Cytoxan 300 mg/M2 and Decadron 40 mg by mouth weekly basis, status post 4 cycles. First cycle on 12/29/2012.    CURRENT THERAPY: Observation.   INTERVAL HISTORY: Nancy Norris 73 y.o. female returns to the clinic today for followup visit. She is feeling fine today with no specific complaints. He has occasional dizzy spells especially when she walks. She is not interested in seeing neurology for ENT for evaluation of this problem. She denied having any significant nausea or vomiting, no fever or chills. The patient denied having any chest pain, shortness of breath, cough or hemoptysis. No significant weight loss or night sweats. She had repeat myeloma panel performed recently and she is here for evaluation and discussion of her lab results.   MEDICAL HISTORY: Past Medical History  Diagnosis Date   . Hyperkalemia   . Hypothyroidism   . COPD (chronic obstructive pulmonary disease)   . Hyponatremia   . Dizziness   . Fibromyalgia   . Breast cancer   . Multiple myeloma   . Mucositis   . Hx of echocardiogram     a.  Echocardiogram (12/26/2012): EF 23-76%, grade 1 diastolic dysfunction;   b.  Echocardiogram (02/2013): EF 55-60%, no WMA, trivial effusion  . Fibromyalgia   . Arthritis   . Hx of cardiovascular stress test     LexiScan with low level exercise Myoview (02/2013): No ischemia, EF 72%; normal study  . Complication of anesthesia   . PONV (postoperative nausea and vomiting) 2008    after mastestomy  . Myocardial infarction     in past, patient was unaware.   . Anginal pain     used NTG x 2 May 31 and 06/15/13   . GERD (gastroesophageal reflux disease)   . Headache(784.0)   . Anxiety   . Depression   . CKD (chronic kidney disease) stage 3, GFR 30-59 ml/min   . Anemia   . History of blood transfusion     last one May 12   . Neuropathy     ALLERGIES:  is allergic to codeine; latex; other; adhesive; iodinated diagnostic agents; and sulfa antibiotics.  MEDICATIONS:  Current Outpatient Prescriptions  Medication Sig Dispense Refill  . acetaminophen (TYLENOL) 325 MG tablet Take 650 mg by mouth every 6 (six) hours as needed for mild pain or moderate pain.     Marland Kitchen albuterol (PROVENTIL HFA;VENTOLIN HFA) 108 (90 BASE) MCG/ACT inhaler Inhale 2 puffs into the lungs 2 (two)  times daily as needed for wheezing or shortness of breath. 1 each 6  . ascorbic acid (VITAMIN C) 250 MG CHEW Chew 250 mg by mouth daily.    Marland Kitchen aspirin EC 81 MG tablet Take 1 tablet (81 mg total) by mouth daily.    Marland Kitchen buPROPion (WELLBUTRIN XL) 300 MG 24 hr tablet   98  . carboxymethylcellulose (REFRESH PLUS) 0.5 % SOLN Place 1 drop into both eyes every 2 (two) hours.    . cetirizine (ZYRTEC) 10 MG tablet Take 10 mg by mouth at bedtime.     . Cholecalciferol 4000 UNITS TABS Take 4,000 Units by mouth daily.    .  citalopram (CELEXA) 40 MG tablet TAKE 1 TABLET BY MOUTH ONCE DAILY. 90 tablet 0  . cromolyn (OPTICROM) 4 % ophthalmic solution Place 1 drop into both eyes 4 (four) times daily.    Marland Kitchen esomeprazole (NEXIUM) 40 MG capsule Take 1 capsule (40 mg total) by mouth daily. Each morning for acid indigestion 30 capsule 99  . Ferrous Sulfate 134 MG TABS Take 1 tablet by mouth every other day.     . fluticasone (FLONASE) 50 MCG/ACT nasal spray Place 2 sprays into both nostrils at bedtime.     . gabapentin (NEURONTIN) 300 MG capsule Take 1 capsule (300 mg total) by mouth at bedtime. 90 capsule 3  . isosorbide mononitrate (IMDUR) 30 MG 24 hr tablet Take 15 mg by mouth daily. Patient taking 15 mg, had a severe migraine with $RemoveBefor'30mg'[id number]$  dose.    Marland Kitchen ketoconazole (NIZORAL) 2 % cream Apply 1 application topically 2 (two) times daily as needed for irritation. 15 g 0  . levothyroxine (SYNTHROID, LEVOTHROID) 150 MCG tablet Take 150-225 mcg by mouth daily before breakfast. Takes 159mcg Mon-Wed-Fri  Takes 268mcg Sat, Sun, Tues and Thurs    . loteprednol (ALREX) 0.2 % SUSP Place 1 drop into both eyes 4 (four) times daily. 1 Bottle PRN  . nitroGLYCERIN (NITROSTAT) 0.4 MG SL tablet Place 1 tablet (0.4 mg total) under the tongue every 5 (five) minutes as needed for chest pain. 25 tablet 3  . nystatin (MYCOSTATIN) powder   0  . ondansetron (ZOFRAN-ODT) 8 MG disintegrating tablet Take 1 tablet (8 mg total) by mouth every 8 (eight) hours as needed for nausea or vomiting. 20 tablet 3  . prochlorperazine (COMPAZINE) 10 MG tablet TAKE 1 TABLET BY MOUTH EVERY 6 HOURS AS NEEDED FOR NAUSEA OR VOMITING. (Patient taking differently: TAKE 1 TABLET BY MOUTH EVERY AT BEDTIME AS NEEDED FOR NAUSEA OR VOMITING.) 60 tablet 0  . ranitidine (ZANTAC) 300 MG tablet Take 1 tablet (300 mg total) by mouth at bedtime. For acid reflux and indigestion 30 tablet 99  . RESTASIS 0.05 % ophthalmic emulsion Take 1 drop by mouth 2 (two) times daily.  4  . sucralfate  (CARAFATE) 1 G tablet Take 1 tablet (1 g total) by mouth 4 (four) times daily. Dissolve in water and swallow (Patient taking differently: Take 1 g by mouth 3 (three) times daily with meals. Dissolve in water and swallow) 120 tablet 99  . traMADol (ULTRAM) 50 MG tablet Take 1 tablet (50 mg total) by mouth every 6 (six) hours as needed. (Patient taking differently: Take 50 mg by mouth every 6 (six) hours as needed for moderate pain. ) 15 tablet 0  . zolpidem (AMBIEN) 5 MG tablet Take 5 mg by mouth at bedtime as needed for sleep.     No current facility-administered medications for this visit.  Facility-Administered Medications Ordered in Other Visits  Medication Dose Route Frequency Provider Last Rate Last Dose  . 0.9 %  sodium chloride infusion   Intravenous Once Adrena E Johnson, PA-C      . sodium chloride 0.9 % injection 10 mL  10 mL Intracatheter PRN Curt Bears, MD   10 mL at 01/05/13 1825    SURGICAL HISTORY:  Past Surgical History  Procedure Laterality Date  . History of port removal    . Status post stem cell transplant on September 28, 2008.    Marland Kitchen Abdominal hysterectomy  1981  . Cholecystectomy  1971  . Mastectomy Left 2008  . Cataract extraction, bilateral    . Breast surgery    . Eye surgery Bilateral     lens implant  . Breast reconstruction    . Portacath placement  12/2012    has had 2  . Av fistula placement Left 06/19/2013    Procedure: CREATION OF LEFT ARM ARTERIOVENOUS (AV) FISTULA ;  Surgeon: Angelia Mould, MD;  Location: [facility] OR;  Service: Vascular;  Laterality: Left;    REVIEW OF SYSTEMS:  A comprehensive review of systems was negative except for: Constitutional: positive for fatigue Neurological: positive for dizziness   PHYSICAL EXAMINATION: General appearance: alert, cooperative and no distress Head: Normocephalic, without obvious abnormality, atraumatic Neck: no adenopathy Lymph nodes: Cervical, supraclavicular, and axillary nodes normal. Resp:  clear to auscultation bilaterally Back: symmetric, no curvature. ROM normal. No CVA tenderness. Cardio: regular rate and rhythm, S1, S2 normal, no murmur, click, rub or gallop GI: soft, non-tender; bowel sounds normal; no masses,  no organomegaly Extremities: extremities normal, atraumatic, no cyanosis or edema Neurologic: Alert and oriented X 3, normal strength and tone. Normal symmetric reflexes. Normal coordination and gait  ECOG PERFORMANCE STATUS: 1 - Symptomatic but completely ambulatory  Blood pressure 101/51, pulse 81, temperature 98.4 F (36.9 C), temperature source Oral, resp. rate 18, height $RemoveBe'5\' 5"'kMGkztGfc$  (1.651 m), weight 168 lb 14.4 oz (76.613 kg), SpO2 90 %.  LABORATORY DATA: Lab Results  Component Value Date   WBC 7.0 01/11/2014   HGB 10.0* 01/11/2014   HCT 29.8* 01/11/2014   MCV 96.8 01/11/2014   PLT 235 01/11/2014      Chemistry      Component Value Date/Time   NA 142 01/11/2014 1127   NA 142 01/05/2014 0830   K 3.7 01/11/2014 1127   K 3.4* 01/05/2014 0830   CL 110 01/11/2014 1127   CL 105 03/19/2012 0811   CO2 18* 01/11/2014 1127   CO2 19* 01/05/2014 0830   BUN 35* 01/11/2014 1127   BUN 25.1 01/05/2014 0830   CREATININE 3.80* 01/11/2014 1127   CREATININE 3.4* 01/05/2014 0830   CREATININE 3.59* 12/02/2013 1144      Component Value Date/Time   CALCIUM 8.8 01/11/2014 1127   CALCIUM 8.6 01/05/2014 0830   ALKPHOS 95 01/11/2014 1127   ALKPHOS 120 01/05/2014 0830   AST 14 01/11/2014 1127   AST 17 01/05/2014 0830   ALT 14 01/11/2014 1127   ALT 26 01/05/2014 0830   BILITOT 0.5 01/11/2014 1127   BILITOT 0.50 01/05/2014 0830     Other lab results: Beta-2 microglobulin 10.40, free kappa light chain 1.81, free lambda light chain 0.54, kappa/lambda ratio 3.35. Ig 618 gA 21 and IgM 48   RADIOGRAPHIC STUDIES:  ASSESSMENT AND PLAN: This is a very pleasant 73 years old white female with relapsed multiple myeloma. She is currently undergoing systemic chemotherapy with  Carfilzomib, Cytoxan and dexamethasone status post 4 cycles. Her myeloma panel is stable. She was wondering if resuming systemic chemotherapy would improve her renal function but unfortunately I did not see significant improvement in her renal function when she was taking chemotherapy. I discussed the lab result with the patient today and recommended for her to continue on observation with repeat myeloma panel in 3 months. For the end-stage renal disease, the patient is followed by [facility] kidney.  She was advised to call immediately if she has any concerning symptoms in the interval.  Disclaimer: This note was dictated with voice recognition software. Similar sounding words can inadvertently be transcribed and may not be corrected upon review.  [name]., MD 01/12/2014

## 2014-01-13 ENCOUNTER — Other Ambulatory Visit: Payer: Self-pay | Admitting: *Deleted

## 2014-01-13 MED ORDER — ZOLPIDEM TARTRATE 5 MG PO TABS: 5.0000 mg | ORAL_TABLET | Freq: Every evening | ORAL | Status: DC | PRN

## 2014-01-24 ENCOUNTER — Other Ambulatory Visit: Payer: Self-pay | Admitting: Internal Medicine

## 2014-01-24 ENCOUNTER — Encounter: Payer: Self-pay | Admitting: Internal Medicine

## 2014-01-24 MED ORDER — FLUDROCORTISONE ACETATE 0.1 MG PO TABS: ORAL_TABLET | ORAL | Status: DC

## 2014-01-28 ENCOUNTER — Telehealth: Payer: Self-pay | Admitting: Nutrition

## 2014-01-28 NOTE — Telephone Encounter (Signed)
Patient identified at risk for malnutrition on the malnutrition risk screen secondary to poor appetite and weight loss. Contacted patient by phone.  However, she was not available. Left message with name and phone number to return call if desired.

## 2014-02-02 DIAGNOSIS — N2581 Secondary hyperparathyroidism of renal origin: Secondary | ICD-10-CM | POA: Diagnosis not present

## 2014-02-02 DIAGNOSIS — N184 Chronic kidney disease, stage 4 (severe): Secondary | ICD-10-CM | POA: Diagnosis not present

## 2014-02-02 DIAGNOSIS — D630 Anemia in neoplastic disease: Secondary | ICD-10-CM | POA: Diagnosis not present

## 2014-02-09 ENCOUNTER — Telehealth: Payer: Self-pay

## 2014-02-09 NOTE — Telephone Encounter (Signed)
Patient rescheduled sleep study from 01-26-14 to 03-02-14 @ 9 pm per call from [place] , called to advise [place] of change, advised I would make note

## 2014-02-18 ENCOUNTER — Other Ambulatory Visit: Payer: Self-pay | Admitting: Internal Medicine

## 2014-02-18 ENCOUNTER — Other Ambulatory Visit: Payer: Self-pay | Admitting: Physician Assistant

## 2014-02-19 ENCOUNTER — Other Ambulatory Visit: Payer: Self-pay | Admitting: *Deleted

## 2014-02-19 DIAGNOSIS — N08 Glomerular disorders in diseases classified elsewhere: Principal | ICD-10-CM

## 2014-02-19 DIAGNOSIS — C9000 Multiple myeloma not having achieved remission: Secondary | ICD-10-CM

## 2014-02-19 MED ORDER — ZOLPIDEM TARTRATE 5 MG PO TABS
5.0000 mg | ORAL_TABLET | Freq: Every evening | ORAL | Status: DC | PRN
Start: 2014-02-19 — End: 2014-03-26

## 2014-02-22 ENCOUNTER — Ambulatory Visit: Payer: Medicare Other | Admitting: Nutrition

## 2014-02-22 ENCOUNTER — Telehealth: Payer: Self-pay | Admitting: *Deleted

## 2014-02-22 NOTE — Progress Notes (Signed)
Patient was identified to be at risk for malnutrition on the MST secondary to weight loss and poor appetite.  74 year old female diagnosed with recurrent multiple myeloma.  She is a patient of Dr. Earlie Server.  Past medical history includes ductal carcinoma, hypothyroidism, COPD, MI, GERD, headaches, anxiety, depression, chronic kidney disease stage III.  Medications include vitamin C, Wellbutrin, Celexa, Nexium, ferrous sulfate, Synthroid, Zofran, Compazine, Zantac, Carafate.  Labs include glucose 114, BUN 35 and creatinine 3.8 on December 28.  Height: 65 inches. Weight: 167.6 pounds February 8. Usual body weight: 184 pounds July 2015. BMI: 27.89.  Patient endorses weight loss over the last 7 months.   This is a 9% weight loss which was unintentional.   Dietary recall reveals patient consumes 2 meals daily.  She eats oatmeal and raisins at breakfast and a sandwich at dinner.  She does not necessarily like to cook for herself.   She only follows a low-sodium diet at this time.   Her nephrologist has not asked her to limit other foods.  Nutrition diagnosis:  Food and nutrition related knowledge deficit related to recurrent multiple myeloma and history of chronic kidney disease as evidenced by no prior need for nutrition related information.  Intervention:  Educated patient on the importance of increasing meals and snacks to promote weight stabilization.   Reviewed ways patient could add protein to her diet without overindulging.  Encouraged healthy diet of whole, unprocessed foods.  Provided copy of chronic kidney disease fact sheet for patient to begin familiarization of renal diet.   Patient was encouraged to continue low sodium diet as prescribed by physician.   Questions were answered.  Teach back method used.  Monitoring, evaluation, goals: Patient will tolerate increased calories to minimize further weight loss without worsening renal function.  Next visit: Patient can contact me  for questions or concerns.  **Disclaimer: This note was dictated with voice recognition software. Similar sounding words can inadvertently be transcribed and this note may contain transcription errors which may not have been corrected upon publication of note.**

## 2014-02-22 NOTE — Telephone Encounter (Signed)
LM  T O CALL BACK RE  IS  DUE  FOR  APPT   REQUESTING  REFILLS ON MEDS .Nancy Norris

## 2014-02-23 NOTE — Telephone Encounter (Signed)
LM TO CALL BACK ./CY 

## 2014-02-24 ENCOUNTER — Encounter: Payer: Self-pay | Admitting: Internal Medicine

## 2014-02-24 NOTE — Telephone Encounter (Signed)
APPT MADE FOR 03-16-14 A T 2:00 PM  WITH DR NISHAN./CY

## 2014-02-26 DIAGNOSIS — C9000 Multiple myeloma not having achieved remission: Secondary | ICD-10-CM | POA: Diagnosis not present

## 2014-02-26 DIAGNOSIS — N184 Chronic kidney disease, stage 4 (severe): Secondary | ICD-10-CM | POA: Diagnosis not present

## 2014-02-26 DIAGNOSIS — D630 Anemia in neoplastic disease: Secondary | ICD-10-CM | POA: Diagnosis not present

## 2014-02-26 DIAGNOSIS — R809 Proteinuria, unspecified: Secondary | ICD-10-CM | POA: Diagnosis not present

## 2014-02-26 DIAGNOSIS — N2581 Secondary hyperparathyroidism of renal origin: Secondary | ICD-10-CM | POA: Diagnosis not present

## 2014-03-16 ENCOUNTER — Ambulatory Visit: Payer: Medicare Other | Admitting: Cardiovascular Disease

## 2014-03-23 ENCOUNTER — Ambulatory Visit (INDEPENDENT_AMBULATORY_CARE_PROVIDER_SITE_OTHER): Payer: Medicare Other | Admitting: Physician Assistant

## 2014-03-23 ENCOUNTER — Encounter: Payer: Self-pay | Admitting: Physician Assistant

## 2014-03-23 VITALS — BP 108/58 | HR 76 | Temp 97.7°F | Resp 16 | Ht 65.0 in | Wt 168.0 lb

## 2014-03-23 DIAGNOSIS — R21 Rash and other nonspecific skin eruption: Secondary | ICD-10-CM

## 2014-03-23 DIAGNOSIS — I150 Renovascular hypertension: Secondary | ICD-10-CM

## 2014-03-23 DIAGNOSIS — N183 Chronic kidney disease, stage 3 unspecified: Secondary | ICD-10-CM

## 2014-03-23 DIAGNOSIS — D649 Anemia, unspecified: Secondary | ICD-10-CM | POA: Diagnosis not present

## 2014-03-23 DIAGNOSIS — L299 Pruritus, unspecified: Secondary | ICD-10-CM | POA: Diagnosis not present

## 2014-03-23 DIAGNOSIS — C9000 Multiple myeloma not having achieved remission: Secondary | ICD-10-CM

## 2014-03-23 DIAGNOSIS — R5382 Chronic fatigue, unspecified: Secondary | ICD-10-CM | POA: Diagnosis not present

## 2014-03-23 MED ORDER — HYDROXYZINE HCL 25 MG PO TABS: 25.0000 mg | ORAL_TABLET | Freq: Three times a day (TID) | ORAL | Status: DC | PRN

## 2014-03-23 NOTE — Progress Notes (Signed)
Assessment and Plan: Puritis-? From CKD/psychogenic- will check phosphorus, BMP, liver, uric acid- will put on atarax and will increase gabapentin to 300 at lunch and 300 at night.  Fatigue- ? From CKD versus MM- will check CBC, BMP, LFTs.  Rash bilateral arms- raised? Purpura- check labs inculding igA  HPI 74 y.o.female with COPD, hypothyroid, HTN, CKD stage III, MM, last blood transfusion in May presents for several symptoms. She states that she is itching all over and would like a medication for it, no rash other than for the past month on bilateral forearms she has raised well circumscribed erythematous/purple rash. She also complains that she is very fatigued the last 2-3 weeks, she has a history of anemia and has needs blood transfusions in the past.   Lab Results  Component Value Date   WBC 7.0 01/11/2014   HGB 10.0* 01/11/2014   HCT 29.8* 01/11/2014   MCV 96.8 01/11/2014   PLT 235 01/11/2014     Past Medical History  Diagnosis Date  . Hyperkalemia   . Hypothyroidism   . COPD (chronic obstructive pulmonary disease)   . Hyponatremia   . Dizziness   . Fibromyalgia   . Breast cancer   . Multiple myeloma   . Mucositis   . Hx of echocardiogram     a.  Echocardiogram (12/26/2012): EF 68-12%, grade 1 diastolic dysfunction;   b.  Echocardiogram (02/2013): EF 55-60%, no WMA, trivial effusion  . Fibromyalgia   . Arthritis   . Hx of cardiovascular stress test     LexiScan with low level exercise Myoview (02/2013): No ischemia, EF 72%; normal study  . Complication of anesthesia   . PONV (postoperative nausea and vomiting) 2008    after mastestomy  . Myocardial infarction     in past, patient was unaware.   . Anginal pain     used NTG x 2 May 31 and 06/15/13   . GERD (gastroesophageal reflux disease)   . Headache(784.0)   . Anxiety   . Depression   . CKD (chronic kidney disease) stage 3, GFR 30-59 ml/min   . Anemia   . History of blood transfusion     last one May 12   .  Neuropathy      Allergies  Allergen Reactions  . Codeine Anaphylaxis  . Latex Shortness Of Breath    Adhesive products   . Other Other (See Comments)    Onion, chocolate causes migraines  . Adhesive [Tape] Other (See Comments)    blisters  . Iodinated Diagnostic Agents Itching    Happened 60 years ago  . Sulfa Antibiotics Itching      Current Outpatient Prescriptions on File Prior to Visit  Medication Sig Dispense Refill  . acetaminophen (TYLENOL) 325 MG tablet Take 650 mg by mouth every 6 (six) hours as needed for mild pain or moderate pain.     Marland Kitchen albuterol (PROVENTIL HFA;VENTOLIN HFA) 108 (90 BASE) MCG/ACT inhaler Inhale 2 puffs into the lungs 2 (two) times daily as needed for wheezing or shortness of breath. 1 each 6  . ascorbic acid (VITAMIN C) 250 MG CHEW Chew 250 mg by mouth daily.    Marland Kitchen aspirin EC 81 MG tablet Take 1 tablet (81 mg total) by mouth daily.    Marland Kitchen buPROPion (WELLBUTRIN XL) 300 MG 24 hr tablet   98  . carboxymethylcellulose (REFRESH PLUS) 0.5 % SOLN Place 1 drop into both eyes every 2 (two) hours.    . Cholecalciferol 4000 UNITS  TABS Take 4,000 Units by mouth daily.    . citalopram (CELEXA) 40 MG tablet TAKE 1 TABLET BY MOUTH ONCE DAILY. 90 tablet 0  . cromolyn (OPTICROM) 4 % ophthalmic solution Place 1 drop into both eyes 4 (four) times daily.    . Ferrous Sulfate 134 MG TABS Take 1 tablet by mouth every other day.     . fludrocortisone (FLORINEF) 0.1 MG tablet Take 1 to 2 tablets daily as directed for low BP 60 tablet 1  . fluticasone (FLONASE) 50 MCG/ACT nasal spray Place 2 sprays into both nostrils at bedtime.     . gabapentin (NEURONTIN) 300 MG capsule Take 1 capsule (300 mg total) by mouth at bedtime. 90 capsule 3  . isosorbide mononitrate (IMDUR) 30 MG 24 hr tablet TAKE 1 TABLET BY MOUTH DAILY. 30 tablet 11  . levothyroxine (SYNTHROID, LEVOTHROID) 150 MCG tablet TAKE 1 TABLET BY MOUTH ONCE DAILY. 90 tablet 0  . loteprednol (ALREX) 0.2 % SUSP Place 1 drop into  both eyes 4 (four) times daily. 1 Bottle PRN  . nitroGLYCERIN (NITROSTAT) 0.4 MG SL tablet Place 1 tablet (0.4 mg total) under the tongue every 5 (five) minutes as needed for chest pain. 25 tablet 3  . nystatin (MYCOSTATIN) powder   0  . ondansetron (ZOFRAN-ODT) 8 MG disintegrating tablet Take 1 tablet (8 mg total) by mouth every 8 (eight) hours as needed for nausea or vomiting. 20 tablet 3  . prochlorperazine (COMPAZINE) 10 MG tablet TAKE 1 TABLET BY MOUTH EVERY 6 HOURS AS NEEDED FOR NAUSEA OR VOMITING. (Patient taking differently: TAKE 1 TABLET BY MOUTH EVERY AT BEDTIME AS NEEDED FOR NAUSEA OR VOMITING.) 60 tablet 0  . RESTASIS 0.05 % ophthalmic emulsion Take 1 drop by mouth 2 (two) times daily.  4  . traMADol (ULTRAM) 50 MG tablet Take 1 tablet (50 mg total) by mouth every 6 (six) hours as needed. (Patient taking differently: Take 50 mg by mouth every 6 (six) hours as needed for moderate pain. ) 15 tablet 0  . zolpidem (AMBIEN) 5 MG tablet Take 1 tablet (5 mg total) by mouth at bedtime as needed for sleep. 30 tablet 0  . ranitidine (ZANTAC) 300 MG tablet Take 1 tablet (300 mg total) by mouth at bedtime. For acid reflux and indigestion 30 tablet 99   Current Facility-Administered Medications on File Prior to Visit  Medication Dose Route Frequency Provider Last Rate Last Dose  . 0.9 %  sodium chloride infusion   Intravenous Once Adrena E Johnson, PA-C      . sodium chloride 0.9 % injection 10 mL  10 mL Intracatheter PRN Si Gaul, MD   10 mL at 01/05/13 1825    ROS: all negative except above.   Physical Exam: Filed Weights   03/23/14 1518  Weight: 168 lb (76.204 kg)   BP 108/58 mmHg  Pulse 76  Temp(Src) 97.7 F (36.5 C)  Resp 16  Ht 5\' 5"  (1.651 m)  Wt 168 lb (76.204 kg)  BMI 27.96 kg/m2 General Appearance: Well nourished, in no apparent distress. Eyes: PERRLA, EOMs, conjunctiva no swelling or erythema Sinuses: No Frontal/maxillary tenderness ENT/Mouth: Ext aud canals clear,  TMs without erythema, bulging. No erythema, swelling, or exudate on post pharynx.  Tonsils not swollen or erythematous. Hearing normal.  Neck: Supple, thyroid normal.  Respiratory: Respiratory effort normal, BS equal bilaterally without rales, rhonchi, wheezing or stridor.  Cardio: RRR with no MRGs. Brisk peripheral pulses without edema. Port on right chest  Abdomen: Soft, + BS.  Non tender, no guarding, rebound, hernias, masses. Lymphatics: Non tender without lymphadenopathy.  Musculoskeletal: Full ROM, 5/5 strength, normal gait.  Skin:  raised well circumscribed erythematous rash, ? purpura bilateral arms. Warm, dry without rashes, lesions, ecchymosis. Graft on right arm.  Neuro: Cranial nerves intact. Normal muscle tone, no cerebellar symptoms. Sensation intact.  Psych: Awake and oriented X 3, normal affect, Insight and Judgment appropriate.     Vicie Mutters, PA-C 3:31 PM [facility]

## 2014-03-24 LAB — HEPATIC FUNCTION PANEL
ALT: 19 U/L (ref 0–35)
AST: 20 U/L (ref 0–37)
Albumin: 4.2 g/dL (ref 3.5–5.2)
Alkaline Phosphatase: 94 U/L (ref 39–117)
Bilirubin, Direct: 0.1 mg/dL (ref 0.0–0.3)
Indirect Bilirubin: 0.3 mg/dL (ref 0.2–1.2)
Total Bilirubin: 0.4 mg/dL (ref 0.2–1.2)
Total Protein: 6 g/dL (ref 6.0–8.3)

## 2014-03-24 LAB — IGG, IGA, IGM
IgA: 24 mg/dL — ABNORMAL LOW (ref 69–380)
IgG (Immunoglobin G), Serum: 733 mg/dL (ref 690–1700)
IgM, Serum: 27 mg/dL — ABNORMAL LOW (ref 52–322)

## 2014-03-24 LAB — CBC WITH DIFFERENTIAL/PLATELET
Basophils Absolute: 0.1 K/uL (ref 0.0–0.1)
Basophils Relative: 1 % (ref 0–1)
Eosinophils Absolute: 0.2 K/uL (ref 0.0–0.7)
Eosinophils Relative: 3 % (ref 0–5)
HCT: 30.3 % — ABNORMAL LOW (ref 36.0–46.0)
Hemoglobin: 9.9 g/dL — ABNORMAL LOW (ref 12.0–15.0)
Lymphocytes Relative: 24 % (ref 12–46)
Lymphs Abs: 1.7 K/uL (ref 0.7–4.0)
MCH: 32.2 pg (ref 26.0–34.0)
MCHC: 32.7 g/dL (ref 30.0–36.0)
MCV: 98.7 fL (ref 78.0–100.0)
MPV: 8.7 fL (ref 8.6–12.4)
Monocytes Absolute: 0.7 K/uL (ref 0.1–1.0)
Monocytes Relative: 10 % (ref 3–12)
Neutro Abs: 4.3 K/uL (ref 1.7–7.7)
Neutrophils Relative %: 62 % (ref 43–77)
Platelets: 233 K/uL (ref 150–400)
RBC: 3.07 MIL/uL — ABNORMAL LOW (ref 3.87–5.11)
RDW: 14.5 % (ref 11.5–15.5)
WBC: 6.9 K/uL (ref 4.0–10.5)

## 2014-03-24 LAB — IRON AND TIBC
%SAT: 33 % (ref 20–55)
IRON: 66 ug/dL (ref 42–145)
TIBC: 198 ug/dL — ABNORMAL LOW (ref 250–470)
UIBC: 132 ug/dL (ref 125–400)

## 2014-03-24 LAB — BASIC METABOLIC PANEL WITHOUT GFR
BUN: 28 mg/dL — ABNORMAL HIGH (ref 6–23)
CO2: 20 meq/L (ref 19–32)
Calcium: 9 mg/dL (ref 8.4–10.5)
Chloride: 109 meq/L (ref 96–112)
Creat: 3.87 mg/dL — ABNORMAL HIGH (ref 0.50–1.10)
GFR, Est African American: 13 mL/min — ABNORMAL LOW
GFR, Est Non African American: 11 mL/min — ABNORMAL LOW
Glucose, Bld: 95 mg/dL (ref 70–99)
Potassium: 3.6 meq/L (ref 3.5–5.3)
Sodium: 139 meq/L (ref 135–145)

## 2014-03-24 LAB — PHOSPHORUS: Phosphorus: 3.4 mg/dL (ref 2.3–4.6)

## 2014-03-24 LAB — FERRITIN: Ferritin: 417 ng/mL — ABNORMAL HIGH (ref 10–291)

## 2014-03-24 LAB — URIC ACID: Uric Acid, Serum: 2.7 mg/dL (ref 2.4–7.0)

## 2014-03-25 LAB — COMPLEMENT, TOTAL

## 2014-03-26 ENCOUNTER — Other Ambulatory Visit: Payer: Self-pay | Admitting: Internal Medicine

## 2014-03-30 ENCOUNTER — Other Ambulatory Visit (HOSPITAL_BASED_OUTPATIENT_CLINIC_OR_DEPARTMENT_OTHER): Payer: Medicare Other

## 2014-03-30 DIAGNOSIS — C9000 Multiple myeloma not having achieved remission: Secondary | ICD-10-CM | POA: Diagnosis not present

## 2014-03-30 DIAGNOSIS — N186 End stage renal disease: Secondary | ICD-10-CM | POA: Diagnosis not present

## 2014-03-30 LAB — CBC WITH DIFFERENTIAL/PLATELET
BASO%: 0.3 % (ref 0.0–2.0)
BASOS ABS: 0 10*3/uL (ref 0.0–0.1)
EOS%: 3.2 % (ref 0.0–7.0)
Eosinophils Absolute: 0.2 10*3/uL (ref 0.0–0.5)
HEMATOCRIT: 33.6 % — AB (ref 34.8–46.6)
HEMOGLOBIN: 10.8 g/dL — AB (ref 11.6–15.9)
LYMPH%: 17 % (ref 14.0–49.7)
MCH: 32.5 pg (ref 25.1–34.0)
MCHC: 32.1 g/dL (ref 31.5–36.0)
MCV: 101.2 fL — AB (ref 79.5–101.0)
MONO#: 0.8 10*3/uL (ref 0.1–0.9)
MONO%: 11.7 % (ref 0.0–14.0)
NEUT#: 4.6 10*3/uL (ref 1.5–6.5)
NEUT%: 67.8 % (ref 38.4–76.8)
Platelets: 210 10*3/uL (ref 145–400)
RBC: 3.32 10*6/uL — ABNORMAL LOW (ref 3.70–5.45)
RDW: 14.2 % (ref 11.2–14.5)
WBC: 6.8 10*3/uL (ref 3.9–10.3)
lymph#: 1.2 10*3/uL (ref 0.9–3.3)

## 2014-03-30 LAB — COMPREHENSIVE METABOLIC PANEL (CC13)
ALK PHOS: 116 U/L (ref 40–150)
ALT: 25 U/L (ref 0–55)
ANION GAP: 11 meq/L (ref 3–11)
AST: 27 U/L (ref 5–34)
Albumin: 3.8 g/dL (ref 3.5–5.0)
BUN: 35.7 mg/dL — ABNORMAL HIGH (ref 7.0–26.0)
CO2: 22 mEq/L (ref 22–29)
CREATININE: 4.2 mg/dL — AB (ref 0.6–1.1)
Calcium: 10.1 mg/dL (ref 8.4–10.4)
Chloride: 111 mEq/L — ABNORMAL HIGH (ref 98–109)
EGFR: 10 mL/min/{1.73_m2} — ABNORMAL LOW (ref >90)
Glucose: 108 mg/dl (ref 70–140)
Potassium: 3.9 mEq/L (ref 3.5–5.1)
Sodium: 143 mEq/L (ref 136–145)
Total Bilirubin: 0.54 mg/dL (ref 0.20–1.20)
Total Protein: 6.5 g/dL (ref 6.4–8.3)

## 2014-03-30 LAB — LACTATE DEHYDROGENASE (CC13): LDH: 156 U/L (ref 125–245)

## 2014-04-01 ENCOUNTER — Ambulatory Visit (INDEPENDENT_AMBULATORY_CARE_PROVIDER_SITE_OTHER): Payer: Medicare Other | Admitting: Cardiovascular Disease

## 2014-04-01 VITALS — BP 96/60 | HR 80 | Ht 65.0 in | Wt 168.0 lb

## 2014-04-01 DIAGNOSIS — R0989 Other specified symptoms and signs involving the circulatory and respiratory systems: Secondary | ICD-10-CM | POA: Diagnosis not present

## 2014-04-01 DIAGNOSIS — R0602 Shortness of breath: Secondary | ICD-10-CM

## 2014-04-01 LAB — KAPPA/LAMBDA LIGHT CHAINS
KAPPA FREE LGHT CHN: 5.67 mg/dL — AB (ref 0.33–1.94)
KAPPA LAMBDA RATIO: 5.4 — AB (ref 0.26–1.65)
LAMBDA FREE LGHT CHN: 1.05 mg/dL (ref 0.57–2.63)

## 2014-04-01 LAB — IGG, IGA, IGM
IgA: 22 mg/dL — ABNORMAL LOW (ref 69–380)
IgG (Immunoglobin G), Serum: 688 mg/dL — ABNORMAL LOW (ref 690–1700)
IgM, Serum: 31 mg/dL — ABNORMAL LOW (ref 52–322)

## 2014-04-01 LAB — BETA 2 MICROGLOBULIN, SERUM: Beta-2 Microglobulin: 10.8 mg/L — ABNORMAL HIGH (ref <=2.51)

## 2014-04-01 MED ORDER — MIDODRINE HCL 10 MG PO TABS: 10.0000 mg | ORAL_TABLET | Freq: Three times a day (TID) | ORAL | Status: DC

## 2014-04-01 MED ORDER — MIDODRINE HCL 10 MG PO TABS
10.0000 mg | ORAL_TABLET | Freq: Two times a day (BID) | ORAL | Status: DC
Start: 2014-04-01 — End: 2014-04-01

## 2014-04-01 NOTE — Progress Notes (Signed)
Patient ID: Nancy Norris, female   DOB: [birth year], 74 y.o.   MRN: [id number]  [place], [place], [place]  [address] Phone: [phone] Fax:  [phone]  Date:  04/01/2014   ID:  Nancy Norris, DOB 03/06/1940, MRN [id number]  PCP:  [name], MD  Cardiologist:  Dr. Jenkins Rouge  Nephrologist:  Dr. Marval Regal    History of Present Illness: Nancy Norris is a 74 y.o. female with a hx of recurrent multiple myeloma (s/p stem cell transplant in 2010), COPD, hypothyroidism, ductal carcinoma in situ status post mastectomy, CKD, HTN, GERD. She was evaluated by Dr. [name] 2012 for surgical clearance. She had an abnormal ECG.  An echocardiogram demonstrated normal LV function and she was cleared for surgery. Recent Echocardiogram (12/26/2012): EF 72-53%, grade 1 diastolic dysfunction.  She was recently admitted 12/30-1/1 with abrupt onset of dyspnea (while coming back from chemotherapy).  She was profoundly anemic with a hemoglobin of 6.8. She was transfused with packed red blood cells. She had worsening renal function. Chest CT angiogram was not performed. VQ scan was negative for pulmonary embolism.  LE venous Dopplers were negative for DVT bilaterally.  There was some concern for volume overload and she was treated with IV Lasix x1.  She was evaluated by Dr. Annamaria Boots for pulmonology. PFTs have been scheduled. Patient noted chest discomfort as well and she has been referred back to cardiology for evaluation.  On dialysis now with fistula  Dizzy  Chest pains resolved  Normal myovue 2/15   Significant postural sympotms   CR 4.3  Hct 33   Recent Labs: 01/11/2014: HDL-C 30*; LDL (calc) 83; TSH 0.023* 03/30/2014: ALT 25; Creatinine 4.2*; Hemoglobin 10.8*; Potassium 3.9  Wt Readings from Last 3 Encounters:  04/01/14 168 lb (76.204 kg)  03/23/14 168 lb (76.204 kg)  02/22/14 167 lb 9.6 oz (76.023 kg)     Past Medical History  Diagnosis Date  . Hyperkalemia   .  Hypothyroidism   . COPD (chronic obstructive pulmonary disease)   . Hyponatremia   . Dizziness   . Fibromyalgia   . Breast cancer   . Multiple myeloma   . Mucositis   . Hx of echocardiogram     a.  Echocardiogram (12/26/2012): EF 66-44%, grade 1 diastolic dysfunction;   b.  Echocardiogram (02/2013): EF 55-60%, no WMA, trivial effusion  . Fibromyalgia   . Arthritis   . Hx of cardiovascular stress test     LexiScan with low level exercise Myoview (02/2013): No ischemia, EF 72%; normal study  . Complication of anesthesia   . PONV (postoperative nausea and vomiting) 2008    after mastestomy  . Myocardial infarction     in past, patient was unaware.   . Anginal pain     used NTG x 2 May 31 and 06/15/13   . GERD (gastroesophageal reflux disease)   . Headache(784.0)   . Anxiety   . Depression   . CKD (chronic kidney disease) stage 3, GFR 30-59 ml/min   . Anemia   . History of blood transfusion     last one May 12   . Neuropathy     Current Outpatient Prescriptions  Medication Sig Dispense Refill  . acetaminophen (TYLENOL) 325 MG tablet Take 650 mg by mouth every 6 (six) hours as needed for mild pain or moderate pain.     Marland Kitchen albuterol (PROVENTIL HFA;VENTOLIN HFA) 108 (90 BASE) MCG/ACT inhaler Inhale 2 puffs into the lungs  2 (two) times daily as needed for wheezing or shortness of breath. 1 each 6  . ascorbic acid (VITAMIN C) 250 MG CHEW Chew 250 mg by mouth daily.    Marland Kitchen aspirin EC 81 MG tablet Take 1 tablet (81 mg total) by mouth daily.    Marland Kitchen buPROPion (WELLBUTRIN XL) 300 MG 24 hr tablet   98  . carboxymethylcellulose (REFRESH PLUS) 0.5 % SOLN Place 1 drop into both eyes every 2 (two) hours.    . Cholecalciferol 4000 UNITS TABS Take 4,000 Units by mouth daily.    . citalopram (CELEXA) 40 MG tablet TAKE 1 TABLET BY MOUTH ONCE DAILY. 90 tablet 0  . cromolyn (OPTICROM) 4 % ophthalmic solution Place 1 drop into both eyes 4 (four) times daily.    Marland Kitchen esomeprazole (NEXIUM) 40 MG capsule TAKE 1  CAPSULE BY MOUTH DAILY, EACH MORNING FOR ACID INDIGESTION 30 capsule PRN  . Ferrous Sulfate 134 MG TABS Take 1 tablet by mouth every other day.     . fludrocortisone (FLORINEF) 0.1 MG tablet TAKE 1 TO 2 TABLETS DAILY AS DIRECTED FOR LOW BLOOD PRESSURE. 60 tablet 1  . fluticasone (FLONASE) 50 MCG/ACT nasal spray Place 2 sprays into both nostrils at bedtime.     . gabapentin (NEURONTIN) 300 MG capsule Take 1 capsule (300 mg total) by mouth at bedtime. 90 capsule 3  . hydrOXYzine (ATARAX/VISTARIL) 25 MG tablet Take 1 tablet (25 mg total) by mouth 3 (three) times daily as needed. 90 tablet 1  . isosorbide mononitrate (IMDUR) 30 MG 24 hr tablet TAKE 1 TABLET BY MOUTH DAILY. 30 tablet 11  . levothyroxine (SYNTHROID, LEVOTHROID) 150 MCG tablet TAKE 1 TABLET BY MOUTH ONCE DAILY. 90 tablet 0  . Loratadine (CLARITIN PO) Take by mouth daily.    Marland Kitchen loteprednol (ALREX) 0.2 % SUSP Place 1 drop into both eyes 4 (four) times daily. 1 Bottle PRN  . nitroGLYCERIN (NITROSTAT) 0.4 MG SL tablet Place 1 tablet (0.4 mg total) under the tongue every 5 (five) minutes as needed for chest pain. 25 tablet 3  . nystatin (MYCOSTATIN) powder   0  . ondansetron (ZOFRAN-ODT) 8 MG disintegrating tablet Take 1 tablet (8 mg total) by mouth every 8 (eight) hours as needed for nausea or vomiting. 20 tablet 3  . prochlorperazine (COMPAZINE) 10 MG tablet TAKE 1 TABLET BY MOUTH EVERY 6 HOURS AS NEEDED FOR NAUSEA OR VOMITING. (Patient taking differently: TAKE 1 TABLET BY MOUTH EVERY AT BEDTIME AS NEEDED FOR NAUSEA OR VOMITING.) 60 tablet 0  . ranitidine (ZANTAC) 300 MG tablet TAKE 1 TABLET BY MOUTH AT BEDTIME, FOR ACID REFLUX AND INDIGESTION. 30 tablet PRN  . RESTASIS 0.05 % ophthalmic emulsion Take 1 drop by mouth 2 (two) times daily.  4  . traMADol (ULTRAM) 50 MG tablet Take 1 tablet (50 mg total) by mouth every 6 (six) hours as needed. (Patient taking differently: Take 50 mg by mouth every 6 (six) hours as needed for moderate pain. ) 15  tablet 0  . zolpidem (AMBIEN) 5 MG tablet TAKE 1 TABLET BY MOUTH AT BEDTIME AS NEEDED FOR SLEEP. 30 tablet 0   No current facility-administered medications for this visit.   Facility-Administered Medications Ordered in Other Visits  Medication Dose Route Frequency Provider Last Rate Last Dose  . 0.9 %  sodium chloride infusion   Intravenous Once Adrena E Johnson, PA-C      . sodium chloride 0.9 % injection 10 mL  10 mL Intracatheter PRN Julien Nordmann  [name], MD   10 mL at 01/05/13 1825    Allergies:   Codeine; Latex; Other; Adhesive; Iodinated diagnostic agents; and Sulfa antibiotics   Social History:  The patient  reports that she quit smoking about 9 years ago. Her smoking use included Cigarettes. She has a 30 pack-year smoking history. She has never used smokeless tobacco. She reports that she does not drink alcohol or use illicit drugs.   Family History:  The patient's family history includes Arthritis in her mother; Asthma in her mother; Cancer in her sister; Hyperlipidemia in her brother.   ROS:  Please see the history of present illness.   She denies fevers, chills, cough, melena, hematochezia.   All other systems reviewed and negative.   PHYSICAL EXAM: VS:  BP 96/60 mmHg  Pulse 80  Ht $R'5\' 5"'UQ$  (1.651 m)  Wt 168 lb (76.204 kg)  BMI 27.96 kg/m2 BP 80 / palp standing with dizzyness  Well nourished, well developed, in no acute distress HEENT: normal Neck: no JVD Vascular: No carotid bruit Chest: Tender to palpation Cardiac:  normal S1, S2; RRR; no murmurno rub Lungs:  clear to auscultation bilaterally, no wheezing, rhonchi or rales Abd: soft, + epigastric tenderness, no hepatomegaly Ext: no edema Skin: warm and dry Neuro:  CNs 2-12 intact, no focal abnormalities noted Large fistula in LUE with thrill   EKG:  NSR, HR 65, normal axis, nonspecific ST-T wave changes     ASSESSMENT AND PLAN:  1. Chest Pain: resolved two normal myovue's last year 2. Postural Hypotension start  midodrine 10 tid  F/u oncology, renal and primary  Would be difficult to dialyze with this BP 3. CKD: Continue follow up with nephrology. Some LUE steal from fistula  4. Multiple Myeloma: Continue follow up with oncology. 5. COPD: Formal testing is pending. Follow up with pulmonology as planned. 6.  Disposition: Follow up with me in 3 months    Jenkins Rouge

## 2014-04-01 NOTE — Patient Instructions (Addendum)
Your physician recommends that you schedule a follow-up appointment in:   Nancy Norris has recommended you make the following change in your medication:  START  MIDODRINE  10 MG  1 TAB THREE TIMES  DAILY    Your physician has requested that you have a carotid duplex. This test is an ultrasound of the carotid arteries in your neck. It looks at blood flow through these arteries that supply the brain with blood. Allow one hour for this exam. There are no restrictions or special instructions.   Your physician has requested that you have an echocardiogram. Echocardiography is a painless test that uses sound waves to create images of your heart. It provides your doctor with information about the size and shape of your heart and how well your heart's chambers and valves are working. This procedure takes approximately one hour. There are no restrictions for this procedure.

## 2014-04-02 ENCOUNTER — Observation Stay (HOSPITAL_COMMUNITY)
Admission: EM | Admit: 2014-04-02 | Discharge: 2014-04-04 | Disposition: A | Discharge status: Home / Self Care | Payer: Medicare Other | Attending: Internal Medicine | Admitting: Internal Medicine

## 2014-04-02 ENCOUNTER — Emergency Department (HOSPITAL_COMMUNITY): Payer: Medicare Other

## 2014-04-02 ENCOUNTER — Other Ambulatory Visit: Payer: Self-pay

## 2014-04-02 ENCOUNTER — Encounter (HOSPITAL_COMMUNITY): Payer: Self-pay | Admitting: *Deleted

## 2014-04-02 DIAGNOSIS — Z8579 Personal history of other malignant neoplasms of lymphoid, hematopoietic and related tissues: Secondary | ICD-10-CM | POA: Insufficient documentation

## 2014-04-02 DIAGNOSIS — K210 Gastro-esophageal reflux disease with esophagitis: Secondary | ICD-10-CM

## 2014-04-02 DIAGNOSIS — Z79899 Other long term (current) drug therapy: Secondary | ICD-10-CM | POA: Diagnosis not present

## 2014-04-02 DIAGNOSIS — Z7982 Long term (current) use of aspirin: Secondary | ICD-10-CM | POA: Diagnosis not present

## 2014-04-02 DIAGNOSIS — I252 Old myocardial infarction: Secondary | ICD-10-CM | POA: Insufficient documentation

## 2014-04-02 DIAGNOSIS — E039 Hypothyroidism, unspecified: Secondary | ICD-10-CM | POA: Diagnosis not present

## 2014-04-02 DIAGNOSIS — Z91040 Latex allergy status: Secondary | ICD-10-CM | POA: Diagnosis not present

## 2014-04-02 DIAGNOSIS — N185 Chronic kidney disease, stage 5: Secondary | ICD-10-CM | POA: Diagnosis present

## 2014-04-02 DIAGNOSIS — I5032 Chronic diastolic (congestive) heart failure: Secondary | ICD-10-CM | POA: Diagnosis not present

## 2014-04-02 DIAGNOSIS — E875 Hyperkalemia: Secondary | ICD-10-CM | POA: Insufficient documentation

## 2014-04-02 DIAGNOSIS — I951 Orthostatic hypotension: Principal | ICD-10-CM | POA: Insufficient documentation

## 2014-04-02 DIAGNOSIS — Z853 Personal history of malignant neoplasm of breast: Secondary | ICD-10-CM | POA: Diagnosis not present

## 2014-04-02 DIAGNOSIS — Z87891 Personal history of nicotine dependence: Secondary | ICD-10-CM | POA: Diagnosis not present

## 2014-04-02 DIAGNOSIS — R079 Chest pain, unspecified: Secondary | ICD-10-CM | POA: Diagnosis not present

## 2014-04-02 DIAGNOSIS — E871 Hypo-osmolality and hyponatremia: Secondary | ICD-10-CM | POA: Diagnosis not present

## 2014-04-02 DIAGNOSIS — F329 Major depressive disorder, single episode, unspecified: Secondary | ICD-10-CM | POA: Diagnosis not present

## 2014-04-02 DIAGNOSIS — F419 Anxiety disorder, unspecified: Secondary | ICD-10-CM | POA: Diagnosis not present

## 2014-04-02 DIAGNOSIS — D649 Anemia, unspecified: Secondary | ICD-10-CM | POA: Diagnosis not present

## 2014-04-02 DIAGNOSIS — R55 Syncope and collapse: Secondary | ICD-10-CM

## 2014-04-02 DIAGNOSIS — M797 Fibromyalgia: Secondary | ICD-10-CM | POA: Diagnosis not present

## 2014-04-02 DIAGNOSIS — I10 Essential (primary) hypertension: Secondary | ICD-10-CM

## 2014-04-02 DIAGNOSIS — C9000 Multiple myeloma not having achieved remission: Secondary | ICD-10-CM | POA: Diagnosis present

## 2014-04-02 DIAGNOSIS — M1990 Unspecified osteoarthritis, unspecified site: Secondary | ICD-10-CM | POA: Insufficient documentation

## 2014-04-02 DIAGNOSIS — K219 Gastro-esophageal reflux disease without esophagitis: Secondary | ICD-10-CM | POA: Insufficient documentation

## 2014-04-02 DIAGNOSIS — J449 Chronic obstructive pulmonary disease, unspecified: Secondary | ICD-10-CM | POA: Insufficient documentation

## 2014-04-02 DIAGNOSIS — N186 End stage renal disease: Secondary | ICD-10-CM | POA: Insufficient documentation

## 2014-04-02 DIAGNOSIS — G629 Polyneuropathy, unspecified: Secondary | ICD-10-CM | POA: Insufficient documentation

## 2014-04-02 DIAGNOSIS — Z7951 Long term (current) use of inhaled steroids: Secondary | ICD-10-CM | POA: Diagnosis not present

## 2014-04-02 DIAGNOSIS — I209 Angina pectoris, unspecified: Secondary | ICD-10-CM | POA: Diagnosis not present

## 2014-04-02 DIAGNOSIS — Z992 Dependence on renal dialysis: Secondary | ICD-10-CM | POA: Diagnosis not present

## 2014-04-02 DIAGNOSIS — F339 Major depressive disorder, recurrent, unspecified: Secondary | ICD-10-CM | POA: Diagnosis present

## 2014-04-02 LAB — BASIC METABOLIC PANEL
ANION GAP: 10 (ref 5–15)
BUN: 39 mg/dL — AB (ref 6–23)
CHLORIDE: 111 mmol/L (ref 96–112)
CO2: 19 mmol/L (ref 19–32)
Calcium: 9 mg/dL (ref 8.4–10.5)
Creatinine, Ser: 4.48 mg/dL — ABNORMAL HIGH (ref 0.50–1.10)
GFR calc non Af Amer: 9 mL/min — ABNORMAL LOW (ref >90)
GFR, EST AFRICAN AMERICAN: 10 mL/min — AB (ref >90)
Glucose, Bld: 106 mg/dL — ABNORMAL HIGH (ref 70–99)
Potassium: 3.4 mmol/L — ABNORMAL LOW (ref 3.5–5.1)
Sodium: 140 mmol/L (ref 135–145)

## 2014-04-02 LAB — CBC WITH DIFFERENTIAL/PLATELET
BASOS ABS: 0 10*3/uL (ref 0.0–0.1)
Basophils Relative: 0 % (ref 0–1)
EOS PCT: 4 % (ref 0–5)
Eosinophils Absolute: 0.3 10*3/uL (ref 0.0–0.7)
HCT: 30 % — ABNORMAL LOW (ref 36.0–46.0)
Hemoglobin: 9.8 g/dL — ABNORMAL LOW (ref 12.0–15.0)
LYMPHS ABS: 1.6 10*3/uL (ref 0.7–4.0)
Lymphocytes Relative: 20 % (ref 12–46)
MCH: 32.2 pg (ref 26.0–34.0)
MCHC: 32.7 g/dL (ref 30.0–36.0)
MCV: 98.7 fL (ref 78.0–100.0)
Monocytes Absolute: 1 10*3/uL (ref 0.1–1.0)
Monocytes Relative: 14 % — ABNORMAL HIGH (ref 3–12)
Neutro Abs: 4.8 10*3/uL (ref 1.7–7.7)
Neutrophils Relative %: 62 % (ref 43–77)
Platelets: 232 10*3/uL (ref 150–400)
RBC: 3.04 MIL/uL — ABNORMAL LOW (ref 3.87–5.11)
RDW: 14.1 % (ref 11.5–15.5)
WBC: 7.7 10*3/uL (ref 4.0–10.5)

## 2014-04-02 LAB — TROPONIN I: Troponin I: <0.03 ng/mL (ref <0.031)

## 2014-04-02 MED ORDER — CITALOPRAM HYDROBROMIDE 20 MG PO TABS
40.0000 mg | ORAL_TABLET | Freq: Every day | ORAL | Status: DC
  Administered 2014-04-03 – 2014-04-04 (×2): 40 mg via ORAL
  Filled 2014-04-02 (×3): qty 2

## 2014-04-02 MED ORDER — SODIUM CHLORIDE 0.9 % IJ SOLN
3.0000 mL | Freq: Two times a day (BID) | INTRAMUSCULAR | Status: DC
  Administered 2014-04-03 – 2014-04-04 (×2): 3 mL via INTRAVENOUS

## 2014-04-02 MED ORDER — LORATADINE 10 MG PO TABS
10.0000 mg | ORAL_TABLET | Freq: Every day | ORAL | Status: DC
  Administered 2014-04-03 – 2014-04-04 (×2): 10 mg via ORAL
  Filled 2014-04-02 (×3): qty 1

## 2014-04-02 MED ORDER — LEVOTHYROXINE SODIUM 75 MCG PO TABS: 150.0000 ug | ORAL_TABLET | Freq: Every day | ORAL | Status: DC

## 2014-04-02 MED ORDER — ASPIRIN-ACETAMINOPHEN-CAFFEINE 250-250-65 MG PO TABS
1.0000 | ORAL_TABLET | Freq: Four times a day (QID) | ORAL | Status: DC | PRN
  Administered 2014-04-03: 1 via ORAL
  Filled 2014-04-02 (×4): qty 1

## 2014-04-02 MED ORDER — MIDODRINE HCL 5 MG PO TABS
10.0000 mg | ORAL_TABLET | Freq: Three times a day (TID) | ORAL | Status: DC
  Administered 2014-04-03 – 2014-04-04 (×5): 10 mg via ORAL
  Filled 2014-04-02 (×5): qty 2

## 2014-04-02 MED ORDER — FERROUS SULFATE 325 (65 FE) MG PO TABS
325.0000 mg | ORAL_TABLET | ORAL | Status: DC
  Administered 2014-04-03: 325 mg via ORAL
  Filled 2014-04-02: qty 1

## 2014-04-02 MED ORDER — ALBUTEROL SULFATE (2.5 MG/3ML) 0.083% IN NEBU: 2.5000 mg | INHALATION_SOLUTION | Freq: Two times a day (BID) | RESPIRATORY_TRACT | Status: DC | PRN

## 2014-04-02 MED ORDER — SODIUM CHLORIDE 0.9 % IJ SOLN: 10.0000 mL | Freq: Two times a day (BID) | INTRAMUSCULAR | Status: DC

## 2014-04-02 MED ORDER — ONDANSETRON HCL 4 MG/2ML IJ SOLN: 4.0000 mg | Freq: Four times a day (QID) | INTRAMUSCULAR | Status: DC | PRN

## 2014-04-02 MED ORDER — FAMOTIDINE 20 MG PO TABS
20.0000 mg | ORAL_TABLET | Freq: Every day | ORAL | Status: DC
  Administered 2014-04-02 – 2014-04-03 (×2): 20 mg via ORAL
  Filled 2014-04-02 (×2): qty 1

## 2014-04-02 MED ORDER — GABAPENTIN 300 MG PO CAPS
300.0000 mg | ORAL_CAPSULE | Freq: Every day | ORAL | Status: DC
  Administered 2014-04-02 – 2014-04-03 (×2): 300 mg via ORAL
  Filled 2014-04-02 (×2): qty 1

## 2014-04-02 MED ORDER — ALBUTEROL SULFATE HFA 108 (90 BASE) MCG/ACT IN AERS: 2.0000 | INHALATION_SPRAY | Freq: Two times a day (BID) | RESPIRATORY_TRACT | Status: DC | PRN

## 2014-04-02 MED ORDER — ISOSORBIDE MONONITRATE ER 30 MG PO TB24
30.0000 mg | ORAL_TABLET | Freq: Every day | ORAL | Status: DC
  Administered 2014-04-03 – 2014-04-04 (×2): 30 mg via ORAL
  Filled 2014-04-02 (×3): qty 1

## 2014-04-02 MED ORDER — ONDANSETRON HCL 4 MG PO TABS: 4.0000 mg | ORAL_TABLET | Freq: Four times a day (QID) | ORAL | Status: DC | PRN

## 2014-04-02 MED ORDER — PANTOPRAZOLE SODIUM 40 MG PO TBEC
40.0000 mg | DELAYED_RELEASE_TABLET | Freq: Every day | ORAL | Status: DC
  Administered 2014-04-02 – 2014-04-04 (×3): 40 mg via ORAL
  Filled 2014-04-02 (×3): qty 1

## 2014-04-02 MED ORDER — SODIUM CHLORIDE 0.9 % IJ SOLN: 10.0000 mL | INTRAMUSCULAR | Status: DC | PRN

## 2014-04-02 MED ORDER — FLUTICASONE PROPIONATE 50 MCG/ACT NA SUSP
2.0000 | Freq: Every day | NASAL | Status: DC
  Administered 2014-04-03: 2 via NASAL
  Filled 2014-04-02 (×2): qty 16

## 2014-04-02 MED ORDER — ACETAMINOPHEN 650 MG RE SUPP: 650.0000 mg | Freq: Four times a day (QID) | RECTAL | Status: DC | PRN

## 2014-04-02 MED ORDER — BUPROPION HCL ER (XL) 150 MG PO TB24
300.0000 mg | ORAL_TABLET | Freq: Every day | ORAL | Status: DC
  Administered 2014-04-03 – 2014-04-04 (×2): 300 mg via ORAL
  Filled 2014-04-02 (×3): qty 2

## 2014-04-02 MED ORDER — FERROUS SULFATE 134 MG PO TABS: 1.0000 | ORAL_TABLET | ORAL | Status: DC

## 2014-04-02 MED ORDER — ACETAMINOPHEN 325 MG PO TABS: 650.0000 mg | ORAL_TABLET | Freq: Four times a day (QID) | ORAL | Status: DC | PRN

## 2014-04-02 MED ORDER — NITROGLYCERIN 0.4 MG SL SUBL
0.4000 mg | SUBLINGUAL_TABLET | SUBLINGUAL | Status: DC | PRN
  Administered 2014-04-03: 0.4 mg via SUBLINGUAL
  Filled 2014-04-02: qty 1

## 2014-04-02 MED ORDER — SODIUM CHLORIDE 0.9 % IV BOLUS (SEPSIS)
500.0000 mL | Freq: Once | INTRAVENOUS | Status: AC
  Administered 2014-04-02: 500 mL via INTRAVENOUS

## 2014-04-02 MED ORDER — LEVOTHYROXINE SODIUM 75 MCG PO TABS
150.0000 ug | ORAL_TABLET | Freq: Every day | ORAL | Status: DC
Start: 2014-04-03 — End: 2014-04-04
  Administered 2014-04-03 – 2014-04-04 (×2): 150 ug via ORAL
  Filled 2014-04-02 (×3): qty 2

## 2014-04-02 MED ORDER — ASPIRIN EC 81 MG PO TBEC
81.0000 mg | DELAYED_RELEASE_TABLET | Freq: Every day | ORAL | Status: DC
  Administered 2014-04-03 – 2014-04-04 (×2): 81 mg via ORAL
  Filled 2014-04-02 (×3): qty 1

## 2014-04-02 MED ORDER — ZOLPIDEM TARTRATE 5 MG PO TABS
5.0000 mg | ORAL_TABLET | Freq: Every evening | ORAL | Status: DC | PRN
  Administered 2014-04-02 – 2014-04-03 (×2): 5 mg via ORAL
  Filled 2014-04-02 (×2): qty 1

## 2014-04-02 MED ORDER — SODIUM CHLORIDE 0.9 % IV SOLN
INTRAVENOUS | Status: AC
  Administered 2014-04-02: 21:00:00 via INTRAVENOUS

## 2014-04-02 MED ORDER — ONDANSETRON 8 MG PO TBDP
8.0000 mg | ORAL_TABLET | Freq: Three times a day (TID) | ORAL | Status: DC | PRN
  Filled 2014-04-02: qty 1

## 2014-04-02 MED ORDER — FLUDROCORTISONE ACETATE 0.1 MG PO TABS
0.2000 mg | ORAL_TABLET | Freq: Every day | ORAL | Status: DC
  Administered 2014-04-03 – 2014-04-04 (×2): 0.2 mg via ORAL
  Filled 2014-04-02 (×4): qty 2

## 2014-04-02 MED ORDER — HEPARIN SODIUM (PORCINE) 5000 UNIT/ML IJ SOLN
5000.0000 [IU] | Freq: Three times a day (TID) | INTRAMUSCULAR | Status: DC
  Administered 2014-04-02 – 2014-04-04 (×5): 5000 [IU] via SUBCUTANEOUS
  Filled 2014-04-02 (×5): qty 1

## 2014-04-02 NOTE — ED Notes (Signed)
The pt has had a low bp and syncopal episodes since yesterday.  She saw dr [name] yesterday in the office.,  She had a syncopal episode today at home.  noi ekg was done yesterday.  She has a lt arm fistula  One yeare ago.   noi dialysis yet

## 2014-04-02 NOTE — ED Notes (Signed)
The pt reports that she was  Having some upper lt chest pain this am and she took a nitro sl.. That was was way before she had this syncopal episode this afternoon

## 2014-04-02 NOTE — ED Provider Notes (Signed)
CSN: [id number]     Arrival date & time 04/02/14  1635 History   First MD Initiated Contact with Patient 04/02/14 1740     Chief Complaint  Patient presents with  . Loss of Consciousness     (Consider location/radiation/quality/duration/timing/severity/associated sxs/prior Treatment) The history is provided by the patient.  Nancy Norris is a 74 y.o. female hx of hypothyroidism, COPD, hyponatremia here presenting with near syncope. She has been having near syncope episodes for the last several weeks. She also has stage IV renal failure but has not started on dialysis yet. She has known orthostatic hypotension is already on florinef.  She saw her cardiologist yesterday and was prescribed midodrine. She has been taking it however has been feeling lightheaded and dizzy today and almost passed out when she stood up. She has been having intermittent chest pain but not getting worse today. Denies any shortness of breath or abdominal pain. She has chronic nausea but no vomiting.   Past Medical History  Diagnosis Date  . Hyperkalemia   . Hypothyroidism   . COPD (chronic obstructive pulmonary disease)   . Hyponatremia   . Dizziness   . Fibromyalgia   . Breast cancer   . Multiple myeloma   . Mucositis   . Hx of echocardiogram     a.  Echocardiogram (12/26/2012): EF 00-37%, grade 1 diastolic dysfunction;   b.  Echocardiogram (02/2013): EF 55-60%, no WMA, trivial effusion  . Fibromyalgia   . Arthritis   . Hx of cardiovascular stress test     LexiScan with low level exercise Myoview (02/2013): No ischemia, EF 72%; normal study  . Complication of anesthesia   . PONV (postoperative nausea and vomiting) 2008    after mastestomy  . Myocardial infarction     in past, patient was unaware.   . Anginal pain     used NTG x 2 May 31 and 06/15/13   . GERD (gastroesophageal reflux disease)   . Headache(784.0)   . Anxiety   . Depression   . CKD (chronic kidney disease) stage 3, GFR 30-59 ml/min   .  Anemia   . History of blood transfusion     last one May 12   . Neuropathy    Past Surgical History  Procedure Laterality Date  . History of port removal    . Status post stem cell transplant on September 28, 2008.    Marland Kitchen Abdominal hysterectomy  1981  . Cholecystectomy  1971  . Mastectomy Left 2008  . Cataract extraction, bilateral    . Breast surgery    . Eye surgery Bilateral     lens implant  . Breast reconstruction    . Portacath placement  12/2012    has had 2  . Av fistula placement Left 06/19/2013    Procedure: CREATION OF LEFT ARM ARTERIOVENOUS (AV) FISTULA ;  Surgeon: Angelia Mould, MD;  Location: [facility] OR;  Service: Vascular;  Laterality: Left;   Family History  Problem Relation Age of Onset  . Arthritis Mother   . Asthma Mother   . Cancer Sister   . Hyperlipidemia Brother    History  Substance Use Topics  . Smoking status: Former Smoker -- 1.00 packs/day for 30 years    Types: Cigarettes    Quit date: 02/15/2005  . Smokeless tobacco: Never Used  . Alcohol Use: No   OB History    No data available     Review of Systems  Cardiovascular: Positive for syncope.  Neurological: Positive for dizziness.  All other systems reviewed and are negative.     Allergies  Codeine; Latex; Other; Onion; Adhesive; Iodinated diagnostic agents; and Sulfa antibiotics  Home Medications   Prior to Admission medications   Medication Sig Start Date End Date Taking? Authorizing Provider  acetaminophen (TYLENOL) 325 MG tablet Take 650 mg by mouth every 6 (six) hours as needed for mild pain or moderate pain.    Yes Historical Provider, MD  albuterol (PROVENTIL HFA;VENTOLIN HFA) 108 (90 BASE) MCG/ACT inhaler Inhale 2 puffs into the lungs 2 (two) times daily as needed for wheezing or shortness of breath. 06/04/13  Yes [name], MD  ascorbic acid (VITAMIN C) 250 MG CHEW Chew 250 mg by mouth daily.   Yes Historical Provider, MD  aspirin EC 81 MG tablet Take 1 tablet (81 mg  total) by mouth daily. 01/29/13  Yes Scott T Kathlen Mody, PA-C  buPROPion (WELLBUTRIN XL) 300 MG 24 hr tablet Take 300 mg by mouth daily.  11/03/13  Yes Historical Provider, MD  carboxymethylcellulose (REFRESH PLUS) 0.5 % SOLN Place 1 drop into both eyes every 2 (two) hours.   Yes Historical Provider, MD  Cholecalciferol 4000 UNITS TABS Take 4,000 Units by mouth daily.   Yes Historical Provider, MD  citalopram (CELEXA) 40 MG tablet TAKE 1 TABLET BY MOUTH ONCE DAILY. 02/18/14  Yes [name], MD  cromolyn (OPTICROM) 4 % ophthalmic solution Place 1 drop into both eyes 4 (four) times daily.   Yes Historical Provider, MD  esomeprazole (NEXIUM) 40 MG capsule TAKE 1 CAPSULE BY MOUTH DAILY, EACH MORNING FOR ACID INDIGESTION 03/26/14  Yes [name], MD  Ferrous Sulfate 134 MG TABS Take 1 tablet by mouth every other day.    Yes Historical Provider, MD  fludrocortisone (FLORINEF) 0.1 MG tablet TAKE 1 TO 2 TABLETS DAILY AS DIRECTED FOR LOW BLOOD PRESSURE. 03/26/14  Yes [name], MD  fluticasone [facility]) 50 MCG/ACT nasal spray Place 2 sprays into both nostrils at bedtime.  03/17/13  Yes Historical Provider, MD  gabapentin (NEURONTIN) 300 MG capsule Take 1 capsule (300 mg total) by mouth at bedtime. 07/06/13  Yes [name], MD  hydrOXYzine (ATARAX/VISTARIL) 25 MG tablet Take 1 tablet (25 mg total) by mouth 3 (three) times daily as needed. 03/23/14  Yes Vicie Mutters, PA-C  isosorbide mononitrate (IMDUR) 30 MG 24 hr tablet TAKE 1 TABLET BY MOUTH DAILY. 02/21/14  Yes Josue Hector, MD  levothyroxine (SYNTHROID, LEVOTHROID) 150 MCG tablet TAKE 1 TABLET BY MOUTH ONCE DAILY. 02/18/14  Yes [name], MD  loratadine (CLARITIN) 10 MG tablet Take 10 mg by mouth daily.   Yes Historical Provider, MD  loteprednol (ALREX) 0.2 % SUSP Place 1 drop into both eyes 4 (four) times daily. 12/08/13  Yes Vicie Mutters, PA-C  midodrine (PROAMATINE) 10 MG tablet Take 1 tablet (10 mg total) by mouth 3 (three) times daily.  04/01/14  Yes Josue Hector, MD  nitroGLYCERIN (NITROSTAT) 0.4 MG SL tablet Place 1 tablet (0.4 mg total) under the tongue every 5 (five) minutes as needed for chest pain. 01/29/13  Yes Scott Joylene Draft, PA-C  nystatin (MYCOSTATIN/NYSTOP) 100000 UNIT/GM POWD Apply 1 g topically 2 (two) times daily. Apply to belly   Yes Historical Provider, MD  ondansetron (ZOFRAN-ODT) 8 MG disintegrating tablet Take 1 tablet (8 mg total) by mouth every 8 (eight) hours as needed for nausea or vomiting. 01/27/13  Yes Carlton Adam, PA-C  prochlorperazine (COMPAZINE) 10 MG tablet TAKE  1 TABLET BY MOUTH EVERY 6 HOURS AS NEEDED FOR NAUSEA OR VOMITING. Patient taking differently: TAKE 1 TABLET BY MOUTH EVERY AT BEDTIME AS NEEDED FOR NAUSEA OR VOMITING. 09/29/13  Yes Curt Bears, MD  ranitidine (ZANTAC) 300 MG tablet TAKE 1 TABLET BY MOUTH AT BEDTIME, FOR ACID REFLUX AND INDIGESTION. 03/26/14  Yes [name], MD  RESTASIS 0.05 % ophthalmic emulsion Take 1 drop by mouth 2 (two) times daily. 11/13/13  Yes Historical Provider, MD  traMADol (ULTRAM) 50 MG tablet Take 1 tablet (50 mg total) by mouth every 6 (six) hours as needed. Patient taking differently: Take 50 mg by mouth every 6 (six) hours as needed for moderate pain.  11/24/13  Yes Heather Laisure, PA-C  zolpidem (AMBIEN) 5 MG tablet TAKE 1 TABLET BY MOUTH AT BEDTIME AS NEEDED FOR SLEEP. 03/26/14  Yes Curt Bears, MD   BP 110/47 mmHg  Pulse 59  Resp 20  Ht 5' 5.5" (1.664 m)  Wt 169 lb (76.658 kg)  BMI 27.69 kg/m2  SpO2 98% Physical Exam  Constitutional: She is oriented to person, place, and time.  Chronically ill   HENT:  Head: Normocephalic.  Mouth/Throat: Oropharynx is clear and moist.  Eyes: Conjunctivae are normal. Pupils are equal, round, and reactive to light.  Neck: Normal range of motion. Neck supple.  Cardiovascular: Normal rate, regular rhythm and normal heart sounds.   Pulmonary/Chest: Effort normal and breath sounds normal. No respiratory  distress. She has no wheezes. She has no rales.  Abdominal: Soft. Bowel sounds are normal. She exhibits no distension. There is no tenderness. There is no rebound.  Musculoskeletal: Normal range of motion. She exhibits no edema or tenderness.  Neurological: She is alert and oriented to person, place, and time. No cranial nerve deficit. Coordination normal.  Skin: Skin is warm.  Psychiatric: She has a normal mood and affect. Her behavior is normal. Judgment and thought content normal.  Nursing note and vitals reviewed.   ED Course  Procedures (including critical care time) Labs Review Labs Reviewed  BASIC METABOLIC PANEL - Abnormal; Notable for the following:    Potassium 3.4 (*)    Glucose, Bld 106 (*)    BUN 39 (*)    Creatinine, Ser 4.48 (*)    GFR calc non Af Amer 9 (*)    GFR calc Af Amer 10 (*)    All other components within normal limits  CBC WITH DIFFERENTIAL/PLATELET - Abnormal; Notable for the following:    RBC 3.04 (*)    Hemoglobin 9.8 (*)    HCT 30.0 (*)    Monocytes Relative 14 (*)    All other components within normal limits  TROPONIN I    Imaging Review Dg Chest 2 View  04/02/2014   CLINICAL DATA:  Chest pain.  COPD.  Multiple myeloma.  EXAM: CHEST  2 VIEW  COMPARISON:  12/02/2013  FINDINGS: There is a port in the right anterior chest wall. Normal cardiac silhouette with ectatic aorta. No effusion, infiltrate, or pneumothorax.  IMPRESSION: No acute cardiopulmonary process.   Electronically Signed   By: Suzy Bouchard M.D.   On: 04/02/2014 18:57     EKG Interpretation   Date/Time:  Friday April 02 2014 16:48:55 EDT Ventricular Rate:  59 PR Interval:  184 QRS Duration: 80 QT Interval:  440 QTC Calculation: 435 R Axis:   44 Text Interpretation:  Sinus bradycardia Cannot rule out Anterior infarct ,  age undetermined Abnormal ECG Confirmed by Jeneen Rinks  MD, [place] (  [id number]) on  04/02/2014 4:54:08 PM      MDM   Final diagnoses:  None    Nancy Norris is a  74 y.o. female here with near syncope. Patient orthostatic. Is already on florinef and midodrine. Will likely need admission for orthostatic hypotension. Will hydrate and check renal function.  7:20 PM Cr. Stable compared to several days ago. Trop neg. BP improved with 500 cc bolus. Will admit to tele.    Wandra Arthurs, MD 04/02/14 [phone]

## 2014-04-02 NOTE — H&P (Signed)
[facility] History and Physical  Patient: Nancy Norris  MRN: [id number]  DOB: [birth year]  DOS: the patient was seen and examined on 04/02/2014 PCP: Nadean Corwin, MD  Chief Complaint: Near Syncope  HPI: Nancy Norris is a 74 y.o. 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 The patient is presenting with complaints of dizziness and lightheadedness. She mentions that she has 2 episodes in last 24 hours. She mentions she has been having dizziness and lightheadedness chronically but episodes have been more pronounced and more severe in the last 24 hours. She went to see her cardiologist yesterday who check her orthostatics and placed her on midodrine. After reaching home she started having shaking tremors and the fact that she was going to pass out. Later on the next day she had similar episode at which time she felt that she was going to pass out and requested her neighbor took her to the ER.  She has always been on Florinef for last few year. She has recently increased her Florinef from 1 tablet to 2 tablets one week ago. She denies any other changes in her medication. She denies any focal deficit and his spinning sensations any blurring of the vision any chest pain any palpitation any shortness of breath any nausea and vomiting any abdominal pain any diarrhea or any constipation any burning urination at the time of my evaluation. She did have chest pain earlier which improved with nitroglycerin on 04/02/2014 morning.  The patient is coming from home And at her baseline independent for most of her ADL.  Review of Systems: as mentioned in the history of present illness.  A Comprehensive review of the other systems is negative.  Past Medical History   Diagnosis Date  . Hyperkalemia   . Hypothyroidism   . COPD (chronic obstructive pulmonary disease)   . Hyponatremia   . Dizziness   . Fibromyalgia   . Breast cancer   . Multiple myeloma   . Mucositis   . Hx of echocardiogram     a.  Echocardiogram (12/26/2012): EF 60-65%, grade 1 diastolic dysfunction;   b.  Echocardiogram (02/2013): EF 55-60%, no WMA, trivial effusion  . Fibromyalgia   . Arthritis   . Hx of cardiovascular stress test     LexiScan with low level exercise Myoview (02/2013): No ischemia, EF 72%; normal study  . Complication of anesthesia   . PONV (postoperative nausea and vomiting) 2008    after mastestomy  . Myocardial infarction     in past, patient was unaware.   . Anginal pain     used NTG x 2 May 31 and 06/15/13   . GERD (gastroesophageal reflux disease)   . Headache(784.0)   . Anxiety   . Depression   . CKD (chronic kidney disease) stage 3, GFR 30-59 ml/min   . Anemia   . History of blood transfusion     last one May 12   . Neuropathy    Past Surgical History  Procedure Laterality Date  . History of port removal    . Status post stem cell transplant on September 28, 2008.    [facility] Abdominal hysterectomy  1981  . Cholecystectomy  1971  . Mastectomy Left 2008  . Cataract extraction, bilateral    . Breast surgery    . Eye surgery Bilateral  lens implant  . Breast reconstruction    . Portacath placement  12/2012    has had 2  . Av fistula placement Left 06/19/2013    Procedure: CREATION OF LEFT ARM ARTERIOVENOUS (AV) FISTULA ;  Surgeon: Angelia Mould, MD;  Location: [facility];  Service: Vascular;  Laterality: Left;   Social History:  reports that she quit smoking about 9 years ago. Her smoking use included Cigarettes. She has a 30 pack-year smoking history. She has never used smokeless tobacco. She reports that she does not drink alcohol or use illicit drugs.  Allergies  Allergen Reactions  . Codeine Anaphylaxis  . Latex Shortness Of Breath     Adhesive products   . Other Other (See Comments)    Onion, chocolate causes migraines  . Onion Other (See Comments)    Causes migraine headaches  . Adhesive [Tape] Other (See Comments)    blisters  . Iodinated Diagnostic Agents Itching    Happened 60 years ago  . Sulfa Antibiotics Itching    Family History  Problem Relation Age of Onset  . Arthritis Mother   . Asthma Mother   . Cancer Sister   . Hyperlipidemia Brother     Prior to Admission medications   Medication Sig Start Date End Date Taking? Authorizing Provider  acetaminophen (TYLENOL) 325 MG tablet Take 650 mg by mouth every 6 (six) hours as needed for mild pain or moderate pain.    Yes Historical Provider, MD  albuterol (PROVENTIL HFA;VENTOLIN HFA) 108 (90 BASE) MCG/ACT inhaler Inhale 2 puffs into the lungs 2 (two) times daily as needed for wheezing or shortness of breath. 06/04/13  Yes [name], MD  ascorbic acid (VITAMIN C) 250 MG CHEW Chew 250 mg by mouth daily.   Yes Historical Provider, MD  aspirin EC 81 MG tablet Take 1 tablet (81 mg total) by mouth daily. 01/29/13  Yes Scott T Kathlen Mody, PA-C  buPROPion (WELLBUTRIN XL) 300 MG 24 hr tablet Take 300 mg by mouth daily.  11/03/13  Yes Historical Provider, MD  carboxymethylcellulose (REFRESH PLUS) 0.5 % SOLN Place 1 drop into both eyes every 2 (two) hours.   Yes Historical Provider, MD  Cholecalciferol 4000 UNITS TABS Take 4,000 Units by mouth daily.   Yes Historical Provider, MD  citalopram (CELEXA) 40 MG tablet TAKE 1 TABLET BY MOUTH ONCE DAILY. 02/18/14  Yes [name], MD  cromolyn (OPTICROM) 4 % ophthalmic solution Place 1 drop into both eyes 4 (four) times daily.   Yes Historical Provider, MD  esomeprazole (NEXIUM) 40 MG capsule TAKE 1 CAPSULE BY MOUTH DAILY, EACH MORNING FOR ACID INDIGESTION 03/26/14  Yes [name], MD  Ferrous Sulfate 134 MG TABS Take 1 tablet by mouth every other day.    Yes Historical Provider, MD  fludrocortisone (FLORINEF) 0.1 MG  tablet TAKE 1 TO 2 TABLETS DAILY AS DIRECTED FOR LOW BLOOD PRESSURE. 03/26/14  Yes [name], MD  fluticasone [facility]) 50 MCG/ACT nasal spray Place 2 sprays into both nostrils at bedtime.  03/17/13  Yes Historical Provider, MD  gabapentin (NEURONTIN) 300 MG capsule Take 1 capsule (300 mg total) by mouth at bedtime. 07/06/13  Yes [name], MD  hydrOXYzine (ATARAX/VISTARIL) 25 MG tablet Take 1 tablet (25 mg total) by mouth 3 (three) times daily as needed. 03/23/14  Yes Vicie Mutters, PA-C  isosorbide mononitrate (IMDUR) 30 MG 24 hr tablet TAKE 1 TABLET BY MOUTH DAILY. 02/21/14  Yes Josue Hector, MD  levothyroxine (SYNTHROID, LEVOTHROID) 150  MCG tablet TAKE 1 TABLET BY MOUTH ONCE DAILY. Patient taking differently: TAKE 1 TABLET BY MOUTH ONCE DAILY., 4 times a week and 1 and 1/2 Th, Sat, Sun 02/18/14  Yes [name], MD  loratadine (CLARITIN) 10 MG tablet Take 10 mg by mouth daily.   Yes Historical Provider, MD  loteprednol (ALREX) 0.2 % SUSP Place 1 drop into both eyes 4 (four) times daily. 12/08/13  Yes Vicie Mutters, PA-C  midodrine (PROAMATINE) 10 MG tablet Take 1 tablet (10 mg total) by mouth 3 (three) times daily. 04/01/14  Yes Josue Hector, MD  nitroGLYCERIN (NITROSTAT) 0.4 MG SL tablet Place 1 tablet (0.4 mg total) under the tongue every 5 (five) minutes as needed for chest pain. 01/29/13  Yes Scott Joylene Draft, PA-C  nystatin (MYCOSTATIN/NYSTOP) 100000 UNIT/GM POWD Apply 1 g topically 2 (two) times daily. Apply to belly   Yes Historical Provider, MD  ondansetron (ZOFRAN-ODT) 8 MG disintegrating tablet Take 1 tablet (8 mg total) by mouth every 8 (eight) hours as needed for nausea or vomiting. 01/27/13  Yes Adrena E Johnson, PA-C  prochlorperazine (COMPAZINE) 10 MG tablet TAKE 1 TABLET BY MOUTH EVERY 6 HOURS AS NEEDED FOR NAUSEA OR VOMITING. Patient taking differently: TAKE 1 TABLET BY MOUTH EVERY AT BEDTIME AS NEEDED FOR NAUSEA OR VOMITING. 09/29/13  Yes Curt Bears, MD  ranitidine  (ZANTAC) 300 MG tablet TAKE 1 TABLET BY MOUTH AT BEDTIME, FOR ACID REFLUX AND INDIGESTION. 03/26/14  Yes [name], MD  RESTASIS 0.05 % ophthalmic emulsion Take 1 drop by mouth 2 (two) times daily. 11/13/13  Yes Historical Provider, MD  traMADol (ULTRAM) 50 MG tablet Take 1 tablet (50 mg total) by mouth every 6 (six) hours as needed. Patient taking differently: Take 50 mg by mouth every 6 (six) hours as needed for moderate pain.  11/24/13  Yes Heather Laisure, PA-C  zolpidem (AMBIEN) 5 MG tablet TAKE 1 TABLET BY MOUTH AT BEDTIME AS NEEDED FOR SLEEP. 03/26/14  Yes Curt Bears, MD    Physical Exam: Filed Vitals:   04/02/14 1754 04/02/14 1815 04/02/14 2000 04/02/14 2050  BP: 90/60 110/47 114/51 115/48  Pulse: 62 59 63 63  Temp:    98.8 F (37.1 C)  TempSrc:    Oral  Resp: $Remo'11 20 13 18  'rsuik$ Height:      Weight:    75.4 kg (166 lb 3.6 oz)  SpO2: 96% 98% 100%     General: Alert, Awake and Oriented to Time, Place and Person. Appear in mild distress Eyes: PERRL ENT: Oral Mucosa clear moist. Neck: no JVD Cardiovascular: S1 and S2 Present, no Murmur, Peripheral Pulses Present Respiratory: Bilateral Air entry equal and Decreased, Clear to Auscultation, noCrackles, no wheezes Abdomen: Bowel Sound present, Soft and non tender Skin: no Rash Extremities: Trace Pedal edema, no calf tenderness Neurologic: Grossly no focal neuro deficit.  Labs on Admission:  CBC:  Recent Labs Lab 03/30/14 0930 04/02/14 1718  WBC 6.8 7.7  NEUTROABS 4.6 4.8  HGB 10.8* 9.8*  HCT 33.6* 30.0*  MCV 101.2* 98.7  PLT 210 232    CMP     Component Value Date/Time   NA 140 04/02/2014 1718   NA 143 03/30/2014 0930   K 3.4* 04/02/2014 1718   K 3.9 03/30/2014 0930   CL 111 04/02/2014 1718   CL 105 03/19/2012 0811   CO2 19 04/02/2014 1718   CO2 22 03/30/2014 0930   GLUCOSE 106* 04/02/2014 1718   GLUCOSE 108 03/30/2014 0930  GLUCOSE 94 03/19/2012 0811   BUN 39* 04/02/2014 1718   BUN 35.7* 03/30/2014  0930   CREATININE 4.48* 04/02/2014 1718   CREATININE 4.2* 03/30/2014 0930   CREATININE 3.87* 03/23/2014 1558   CALCIUM 9.0 04/02/2014 1718   CALCIUM 10.1 03/30/2014 0930   PROT 6.5 03/30/2014 0930   PROT 6.0 03/23/2014 1558   ALBUMIN 3.8 03/30/2014 0930   ALBUMIN 4.2 03/23/2014 1558   AST 27 03/30/2014 0930   AST 20 03/23/2014 1558   ALT 25 03/30/2014 0930   ALT 19 03/23/2014 1558   ALKPHOS 116 03/30/2014 0930   ALKPHOS 94 03/23/2014 1558   BILITOT 0.54 03/30/2014 0930   BILITOT 0.4 03/23/2014 1558   GFRNONAA 9* 04/02/2014 1718   GFRNONAA 11* 03/23/2014 1558   GFRAA 10* 04/02/2014 1718   GFRAA 13* 03/23/2014 1558    No results for input(s): LIPASE, AMYLASE in the last 168 hours.   Recent Labs Lab 04/02/14 1718  TROPONINI <0.03   BNP (last 3 results) No results for input(s): BNP in the last 8760 hours.  ProBNP (last 3 results) No results for input(s): PROBNP in the last 8760 hours.   Radiological Exams on Admission: Dg Chest 2 View  04/02/2014   CLINICAL DATA:  Chest pain.  COPD.  Multiple myeloma.  EXAM: CHEST  2 VIEW  COMPARISON:  12/02/2013  FINDINGS: There is a port in the right anterior chest wall. Normal cardiac silhouette with ectatic aorta. No effusion, infiltrate, or pneumothorax.  IMPRESSION: No acute cardiopulmonary process.   Electronically Signed   By: Suzy Bouchard M.D.   On: 04/02/2014 18:57    EKG: Independently reviewed. normal sinus rhythm, nonspecific ST and T waves changes.  Assessment/Plan Principal Problem:   Orthostatic hypotension Active Problems:   Chronic diastolic heart failure   HTN (hypertension)   GERD (gastroesophageal reflux disease)   Fibromyalgia   Depression   Multiple myeloma   Near syncope   CKD (chronic kidney disease) stage 5, GFR less than 15 ml/min   1. Orthostatic hypotension The patient is presenting with complaints of near syncope. Patient has history of orthostatic hypotension and has been on  Florinef. Recently was started on midodrine and also recently she has increased her dose of Florinef. Patient continues to remain symptomatic with this. Currently I will add compression stockings. Consult physical therapy for further treatment on safe gait. Patient is scheduled for outpatient echocardiogram as well as carotid Doppler for left carotid bruit next week. Monitor her on telemetry. Recheck orthostatics in the morning. His this does not improve patient's symptoms other medications that the patient is on Wellbutrin, Celexa, gabapentin, Imdur all are associated with orthostatic hypotension or vasodilatation which can be adjusted or stopped.  2. GERD. Continue PPI.  3. Fibromyalgia. Currently continue home treatment.  4. Hypothyroidism. Continue Synthroid.  5. Chest pain. Follow serial troponin.  Advance goals of care discussion: Full code  DVT Prophylaxis: subcutaneous Heparin Nutrition: Regular diet  Disposition: Admitted to observation in telemetry unit.  Author: Berle Mull, MD [facility] Pager: [phone] 04/02/2014, 9:53 PM    If 7PM-7AM, please contact night-coverage [email or web address] Password [email or web address]

## 2014-04-03 DIAGNOSIS — K219 Gastro-esophageal reflux disease without esophagitis: Secondary | ICD-10-CM

## 2014-04-03 DIAGNOSIS — E039 Hypothyroidism, unspecified: Secondary | ICD-10-CM | POA: Diagnosis not present

## 2014-04-03 DIAGNOSIS — R55 Syncope and collapse: Secondary | ICD-10-CM | POA: Diagnosis not present

## 2014-04-03 DIAGNOSIS — N185 Chronic kidney disease, stage 5: Secondary | ICD-10-CM | POA: Diagnosis not present

## 2014-04-03 DIAGNOSIS — I951 Orthostatic hypotension: Secondary | ICD-10-CM | POA: Diagnosis not present

## 2014-04-03 DIAGNOSIS — J449 Chronic obstructive pulmonary disease, unspecified: Secondary | ICD-10-CM | POA: Diagnosis not present

## 2014-04-03 DIAGNOSIS — E875 Hyperkalemia: Secondary | ICD-10-CM | POA: Diagnosis not present

## 2014-04-03 DIAGNOSIS — I5032 Chronic diastolic (congestive) heart failure: Secondary | ICD-10-CM | POA: Diagnosis not present

## 2014-04-03 LAB — COMPREHENSIVE METABOLIC PANEL
ALT: 26 U/L (ref 0–35)
ANION GAP: 6 (ref 5–15)
AST: 20 U/L (ref 0–37)
Albumin: 3.1 g/dL — ABNORMAL LOW (ref 3.5–5.2)
Alkaline Phosphatase: 71 U/L (ref 39–117)
BUN: 33 mg/dL — ABNORMAL HIGH (ref 6–23)
CO2: 22 mmol/L (ref 19–32)
Calcium: 8.3 mg/dL — ABNORMAL LOW (ref 8.4–10.5)
Chloride: 110 mmol/L (ref 96–112)
Creatinine, Ser: 4.29 mg/dL — ABNORMAL HIGH (ref 0.50–1.10)
GFR, EST AFRICAN AMERICAN: 11 mL/min — AB (ref >90)
GFR, EST NON AFRICAN AMERICAN: 9 mL/min — AB (ref >90)
GLUCOSE: 126 mg/dL — AB (ref 70–99)
POTASSIUM: 3.3 mmol/L — AB (ref 3.5–5.1)
Sodium: 138 mmol/L (ref 135–145)
Total Bilirubin: 0.7 mg/dL (ref 0.3–1.2)
Total Protein: 4.8 g/dL — ABNORMAL LOW (ref 6.0–8.3)

## 2014-04-03 LAB — CBC
HEMATOCRIT: 26.6 % — AB (ref 36.0–46.0)
HEMOGLOBIN: 8.8 g/dL — AB (ref 12.0–15.0)
MCH: 33 pg (ref 26.0–34.0)
MCHC: 33.1 g/dL (ref 30.0–36.0)
MCV: 99.6 fL (ref 78.0–100.0)
Platelets: 186 10*3/uL (ref 150–400)
RBC: 2.67 MIL/uL — ABNORMAL LOW (ref 3.87–5.11)
RDW: 14.4 % (ref 11.5–15.5)
WBC: 6.5 10*3/uL (ref 4.0–10.5)

## 2014-04-03 LAB — TROPONIN I
Troponin I: <0.03 ng/mL (ref <0.031)
Troponin I: <0.03 ng/mL (ref <0.031)

## 2014-04-03 LAB — PROTIME-INR
INR: 1.1 (ref 0.00–1.49)
Prothrombin Time: 14.3 seconds (ref 11.6–15.2)

## 2014-04-03 MED ORDER — POTASSIUM CHLORIDE 10 MEQ/50ML IV SOLN
10.0000 meq | INTRAVENOUS | Status: AC
  Administered 2014-04-03 (×2): 10 meq via INTRAVENOUS
  Filled 2014-04-03: qty 50

## 2014-04-03 MED ORDER — SODIUM CHLORIDE 0.9 % IV BOLUS (SEPSIS)
250.0000 mL | Freq: Once | INTRAVENOUS | Status: AC
  Administered 2014-04-03: 250 mL via INTRAVENOUS

## 2014-04-03 MED ORDER — SODIUM CHLORIDE 0.9 % IV BOLUS (SEPSIS)
500.0000 mL | Freq: Once | INTRAVENOUS | Status: AC
  Administered 2014-04-03: 500 mL via INTRAVENOUS

## 2014-04-03 NOTE — Progress Notes (Signed)
[facility] PROGRESS NOTE   Nancy Norris KJZ:[id number] DOB: 09-Apr-1940 DOA: 04/02/2014 PCP: [name], MD  HPI/Subjective: No dizziness, but she was not able to get up today. We'll ask PT to evaluate her.  Assessment/Plan: Principal Problem:   Orthostatic hypotension Active Problems:   Chronic diastolic heart failure   HTN (hypertension)   GERD (gastroesophageal reflux disease)   Fibromyalgia   Depression   Multiple myeloma   Near syncope   CKD (chronic kidney disease) stage 5, GFR less than 15 ml/min   Orthostatic hypotension Patient has long-standing orthostatic hypotension she was on Florinef. Recently seen by her cardiologist up to the dose of Florinef and added midodrine. She came into the hospital because of dizziness and was not able to stand up. Continue current medications, added compression stockings. PT/OT to evaluate and treat, might think about discontinuing Imdur if continues to have symptoms.  Fibromyalgia Continue home medications.  Left-sided breast cancer Had lumpectomy and radiation in 2010, no current issues.  Chest pain Atypical chest pain, this is resolved, has negative 3 sets of cardiac enzymes.  Hypokalemia Replete potassium with oral supplements.   Code Status: Full Code Family Communication: Plan discussed with the patient. Disposition Plan: Remains inpatient Diet: Diet regular  Consultants:  None  Procedures:  None  Antibiotics:  None   Objective: Filed Vitals:   04/03/14 0605  BP: 95/39  Pulse: 67  Temp: 98.4 F (36.9 C)  Resp: 19    Intake/Output Summary (Last 24 hours) at 04/03/14 1215 Last data filed at 04/03/14 0930  Gross per 24 hour  Intake   1220 ml  Output    800 ml  Net    420 ml   Filed Weights   04/02/14 1647 04/02/14 2050 04/03/14 0548  Weight: 76.658 kg (169 lb) 75.4 kg (166 lb 3.6 oz) 75.4 kg (166 lb 3.6 oz)    Exam: General: Alert and awake, oriented x3, not in any  acute distress. HEENT: anicteric sclera, pupils reactive to light and accommodation, EOMI CVS: S1-S2 clear, no murmur rubs or gallops Chest: clear to auscultation bilaterally, no wheezing, rales or rhonchi Abdomen: soft nontender, nondistended, normal bowel sounds, no organomegaly Extremities: no cyanosis, clubbing or edema noted bilaterally Neuro: Cranial nerves II-XII intact, no focal neurological deficits  Data Reviewed: Basic Metabolic Panel:  Recent Labs Lab 03/30/14 0930 04/02/14 1718 04/03/14 0306  NA 143 140 138  K 3.9 3.4* 3.3*  CL  --  111 110  CO2 $Re'22 19 22  'PXD$ GLUCOSE 108 106* 126*  BUN 35.7* 39* 33*  CREATININE 4.2* 4.48* 4.29*  CALCIUM 10.1 9.0 8.3*   Liver Function Tests:  Recent Labs Lab 03/30/14 0930 04/03/14 0306  AST 27 20  ALT 25 26  ALKPHOS 116 71  BILITOT 0.54 0.7  PROT 6.5 4.8*  ALBUMIN 3.8 3.1*   No results for input(s): LIPASE, AMYLASE in the last 168 hours. No results for input(s): AMMONIA in the last 168 hours. CBC:  Recent Labs Lab 03/30/14 0930 04/02/14 1718 04/03/14 0306  WBC 6.8 7.7 6.5  NEUTROABS 4.6 4.8  --   HGB 10.8* 9.8* 8.8*  HCT 33.6* 30.0* 26.6*  MCV 101.2* 98.7 99.6  PLT 210 232 186   Cardiac Enzymes:  Recent Labs Lab 04/02/14 1718 04/02/14 2210 04/03/14 0306  TROPONINI <0.03 <0.03 <0.03   BNP (last 3 results) No results for input(s): BNP in the last 8760 hours.  ProBNP (last 3 results) No results for input(s): PROBNP in the  last 8760 hours.  CBG: No results for input(s): GLUCAP in the last 168 hours.  Micro No results found for this or any previous visit (from the past 240 hour(s)).   Studies: Dg Chest 2 View  04/02/2014   CLINICAL DATA:  Chest pain.  COPD.  Multiple myeloma.  EXAM: CHEST  2 VIEW  COMPARISON:  12/02/2013  FINDINGS: There is a port in the right anterior chest wall. Normal cardiac silhouette with ectatic aorta. No effusion, infiltrate, or pneumothorax.  IMPRESSION: No acute cardiopulmonary  process.   Electronically Signed   By: Suzy Bouchard M.D.   On: 04/02/2014 18:57    Scheduled Meds: . aspirin EC  81 mg Oral Daily  . buPROPion  300 mg Oral Daily  . citalopram  40 mg Oral Daily  . famotidine  20 mg Oral QHS  . ferrous sulfate  325 mg Oral QODAY  . fludrocortisone  0.2 mg Oral Daily  . fluticasone  2 spray Each Nare QHS  . gabapentin  300 mg Oral QHS  . heparin  5,000 Units Subcutaneous 3 times per day  . isosorbide mononitrate  30 mg Oral Daily  . levothyroxine  150 mcg Oral QAC breakfast  . loratadine  10 mg Oral Daily  . midodrine  10 mg Oral TID PC  . pantoprazole  40 mg Oral Daily  . sodium chloride  3 mL Intravenous Q12H   Continuous Infusions:      Time spent: 35 minutes    [facility]  [facility] Pager [phone] If 7PM-7AM, please contact night-coverage at [email or web address], password [facility] 04/03/2014, 12:15 PM

## 2014-04-03 NOTE — Progress Notes (Signed)
On call MD notified of 95/39 BP and positive orthostatic vitals this AM. Awaiting any possible new orders, pt states feels "swimmyheaded" but otherwise fine. Will continue to monitor closely.

## 2014-04-03 NOTE — Progress Notes (Addendum)
Pt C/O chest pain 8/10. 4L02 placed. MD notified. 12 Lead Obtained, MD orders to cycle troponins. 1Nitro given. WIll monitor blood pressure

## 2014-04-03 NOTE — Progress Notes (Signed)
UR completed 

## 2014-04-04 DIAGNOSIS — I5032 Chronic diastolic (congestive) heart failure: Secondary | ICD-10-CM | POA: Diagnosis not present

## 2014-04-04 DIAGNOSIS — N185 Chronic kidney disease, stage 5: Secondary | ICD-10-CM | POA: Diagnosis not present

## 2014-04-04 DIAGNOSIS — R55 Syncope and collapse: Secondary | ICD-10-CM | POA: Diagnosis not present

## 2014-04-04 DIAGNOSIS — I951 Orthostatic hypotension: Secondary | ICD-10-CM | POA: Diagnosis not present

## 2014-04-04 LAB — TROPONIN I: Troponin I: <0.03 ng/mL (ref <0.031)

## 2014-04-04 MED ORDER — HEPARIN SOD (PORK) LOCK FLUSH 100 UNIT/ML IV SOLN
500.0000 [IU] | INTRAVENOUS | Status: AC | PRN
  Administered 2014-04-04: 500 [IU]

## 2014-04-04 MED ORDER — MECLIZINE HCL 32 MG PO TABS: 32.0000 mg | ORAL_TABLET | Freq: Three times a day (TID) | ORAL | Status: DC | PRN

## 2014-04-04 NOTE — Evaluation (Addendum)
Physical Therapy Evaluation and Discharge Patient Details Name: Nancy Norris MRN: [id number] DOB: [birth year] Today's Date: 04/04/2014   History of Present Illness  74 y.o. female admitted with orthostatic hypotension and symptoms of dizziness.  Clinical Impression  Patient evaluated by Physical Therapy with no further acute PT needs identified. All education has been completed and the patient has no further questions. Positive signs and symptoms of left horizontal canal BPPV with horizontal roll and dix-hallpike (slightly positive in left posterior canal.) Pt reports improvement in symptoms following epley's procedure and BBQ roll technique. Of note she does report a "fullness" feeling in her Rt ear that began around the same time her dizziness began. Recommended she follow up with OP PT if symptoms persist. If an additional treatment for BPPV does not resolve current signs/symptoms, pt would likely benefit most from an ENT follow-up. Ambulates in a guarded manner due to fear of dizziness however is safe and does not require physical assist or an assistive device to mobilize.   See below for any follow-up Physial Therapy or equipment needs. PT is signing off. Thank you for this referral.     Follow Up Recommendations Outpatient PT (Follow up with OP PT if dizziness persists)    Equipment Recommendations  None recommended by PT    Recommendations for Other Services       Precautions / Restrictions Precautions Precautions: Fall Restrictions Weight Bearing Restrictions: No      Mobility  Bed Mobility Overal bed mobility: Modified Independent             General bed mobility comments: Requires extra time.  Transfers Overall transfer level: Modified independent               General transfer comment: Slow and guarded due to dizziness  Ambulation/Gait Ambulation/Gait assistance: Supervision Ambulation Distance (Feet): 165 Feet Assistive device: None Gait  Pattern/deviations: Step-through pattern;Decreased stride length   Gait velocity interpretation: Below normal speed for age/gender General Gait Details: Slow and guarded, careful not to turn her head due to fear of symptoms of dizziness. No loss of balance or buckling noted during bout. Requires no physical assist. Asymptomatic during bout.  Stairs            Wheelchair Mobility    Modified Rankin (Stroke Patients Only)       Balance Overall balance assessment: Modified Independent                                           Pertinent Vitals/Pain Pain Assessment: No/denies pain    Home Living Family/patient expects to be discharged to:: Private residence Living Arrangements: Alone Available Help at Discharge: Neighbor;Available PRN/intermittently Type of Home: Apartment Home Access: Ramped entrance     Home Layout: One level Home Equipment: Walker - 2 wheels;Walker - 4 wheels;Cane - single point;Shower seat      Prior Function Level of Independence: Independent               Hand Dominance   Dominant Hand: Right    Extremity/Trunk Assessment   Upper Extremity Assessment: Defer to OT evaluation           Lower Extremity Assessment: Overall WFL for tasks assessed         Communication   Communication: No difficulties  Cognition Arousal/Alertness: Awake/alert Behavior During Therapy: WFL for tasks assessed/performed Overall Cognitive Status: Within Functional  Limits for tasks assessed                      General Comments General comments (skin integrity, edema, etc.): Performed horiztontal roll and dix hall pike tests. Pt positive for signs and symptoms of BPPV in left horizontal and slightly positive of left posterior canal BPPV. Treated with epley's maneuver and BBQ Roll. Reports improvement following procedures. Very nauseous during testing with notable geotrophic nystagums towards left ear during horizontal roll test.  Possible rotational component noted with nystagmus during left posterior canal testing. Insturcted to sit at 30 degree angle to sleep tonight and prevent quick head turns or supine position for at least 1 day. Pt to follow up with outpatient PT if symptoms persist. Of note pt reports  "FULLNESS" feeling in her RIGHT ear that began around the same time her dizziness began (approx 3 months ago.) No resting nystagmus noted. She did have a leftward beating nystagums when tracking object into her left field of vision at end range but did not elicit symptoms as the positioning procedures had.     Exercises        Assessment/Plan    PT Assessment Patent does not need any further PT services  PT Diagnosis Abnormality of gait;Other (comment) (Dizziness with change in head position)   PT Problem List    PT Treatment Interventions     PT Goals (Current goals can be found in the Care Plan section) Acute Rehab PT Goals Patient Stated Goal: feel better PT Goal Formulation: All assessment and education complete, DC therapy    Frequency     Barriers to discharge        Co-evaluation               End of Session   Activity Tolerance: Patient tolerated treatment well Patient left: in bed;with call bell/phone within reach Nurse Communication: Mobility status    Functional Assessment Tool Used: clinical observation - signs and symptoms of dizziness, limiting patient's ability tolerate change in head positions Functional Limitation: Other PT primary Other PT Primary Current Status (P3790): At least 1 percent but less than 20 percent impaired, limited or restricted Other PT Primary Goal Status (W4097): At least 1 percent but less than 20 percent impaired, limited or restricted Other PT Primary Discharge Status [phone]): At least 1 percent but less than 20 percent impaired, limited or restricted    Time: 1228-1311 PT Time Calculation (min) (ACUTE ONLY): 43 min   Charges:   PT  Evaluation $Initial PT Evaluation Tier I: 1 Procedure PT Treatments $Self Care/Home Management: 8-22 $Canalith Rep Proc: 8-22 mins   PT G Codes:   PT G-Codes **NOT FOR INPATIENT CLASS** Functional Assessment Tool Used: clinical observation - signs and symptoms of dizziness, limiting patient's ability tolerate change in head positions Functional Limitation: Other PT primary Other PT Primary Current Status (J2426): At least 1 percent but less than 20 percent impaired, limited or restricted Other PT Primary Goal Status (S3419): At least 1 percent but less than 20 percent impaired, limited or restricted Other PT Primary Discharge Status [phone]): At least 1 percent but less than 20 percent impaired, limited or restricted    Ellouise Newer 04/04/2014, 2:49 PM Camille Bal [place], [place]

## 2014-04-04 NOTE — Discharge Summary (Signed)
Physician Discharge Summary  Nancy Norris BCW:[id number] DOB: Jul 18, 1940 DOA: 04/02/2014  PCP: [name], MD  Admit date: 04/02/2014 Discharge date: 04/04/2014  Time spent: 40 minutes  Recommendations for Outpatient Follow-up:   Follow-up with primary care physician within one week.   Discharge Diagnoses:  Principal Problem:   Orthostatic hypotension Active Problems:   Chronic diastolic heart failure   HTN (hypertension)   GERD (gastroesophageal reflux disease)   Fibromyalgia   Depression   Multiple myeloma   Near syncope   CKD (chronic kidney disease) stage 5, GFR less than 15 ml/min   Discharge Condition: Stable  Diet recommendation: Heart healthy  Filed Weights   04/02/14 2050 04/03/14 0548 04/04/14 0400  Weight: 75.4 kg (166 lb 3.6 oz) 75.4 kg (166 lb 3.6 oz) 76.975 kg (169 lb 11.2 oz)    History of present illness:  Nancy Norris is a 74 y.o. 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 The patient is presenting with complaints of dizziness and lightheadedness. She mentions that she has 2 episodes in last 24 hours. She mentions she has been having dizziness and lightheadedness chronically but episodes have been more pronounced and more severe in the last 24 hours. She went to see her cardiologist yesterday who check her orthostatics and placed her on midodrine. After reaching home she started having shaking tremors and the fact that she was going to pass out. Later on the next day she had similar episode a t which time she felt that she was going to pass out and requested her neighbor took her to the ER.  She has always been on Florinef for last few year. She has recently increased her Florinef from 1 tablet to 2 tablets one week ago.  She denies any other changes in her medication. She denies any focal deficit and his spinning sensations any blurring of the vision any chest pain any palpitation any shortness of breath any nausea and vomiting any abdominal pain any diarrhea or any constipation any burning urination at the time of my evaluation. She did have chest pain earlier which improved with nitroglycerin on 04/02/2014 morning.  The patient is coming from home And at her baseline independent for most of her ADL.   Hospital Course:   Orthostatic hypotension Patient has long-standing orthostatic hypotension she was on Florinef. Recently seen by her cardiologist up to the dose of Florinef and added midodrine. She came into the hospital because of dizziness and was not able to stand up. Continue current medications, added compression stockings. Patient feels much better with a compression stocking on and overall no symptoms today. Patient described history of vertigo-like symptoms and nausea but she said she had tilt table test and it was negative. Discharge follow-up with primary care physician, if vertigo-like symptoms continue she might need ENT referral again.  Fibromyalgia Continue home medications.  Left-sided breast cancer Had lumpectomy and radiation in 2010, no current issues.  Chest pain Atypical chest pain, this is resolved, has negative 3 sets of cardiac enzymes. Patient reported that she had 2 stress tests in the past year.  Hypokalemia Replete potassium with oral supplements.  Chronic kidney disease stage IV Patient has advanced chronic kidney disease, with creatinine of baseline around 4.2. Patient creatinine at baseline, no evidence of dehydration or worsening this time. Patient has dialysis access but did  not start dialysis tomorrow.   Procedures:  None  Consultations:  None  Discharge Exam: Filed Vitals:   04/04/14 0400  BP: 97/42  Pulse: 63  Temp: 98.1 F (36.7 C)  Resp: 18    General: Alert and awake, oriented x3, not in any acute distress. HEENT: anicteric sclera, pupils reactive to light and accommodation, EOMI CVS: S1-S2 clear, no murmur rubs or gallops Chest: clear to auscultation bilaterally, no wheezing, rales or rhonchi Abdomen: soft nontender, nondistended, normal bowel sounds, no organomegaly Extremities: no cyanosis, clubbing or edema noted bilaterally Neuro: Cranial nerves II-XII intact, no focal neurological deficits  Discharge Instructions   Discharge Instructions    Diet - low sodium heart healthy    Complete by:  As directed      Increase activity slowly    Complete by:  As directed           Current Discharge Medication List    START taking these medications   Details  meclizine (ANTIVERT) 32 MG tablet Take 1 tablet (32 mg total) by mouth 3 (three) times daily as needed. Qty: 30 tablet, Refills: 0      CONTINUE these medications which have NOT CHANGED   Details  acetaminophen (TYLENOL) 325 MG tablet Take 650 mg by mouth every 6 (six) hours as needed for mild pain or moderate pain.     albuterol (PROVENTIL HFA;VENTOLIN HFA) 108 (90 BASE) MCG/ACT inhaler Inhale 2 puffs into the lungs 2 (two) times daily as needed for wheezing or shortness of breath. Qty: 1 each, Refills: 6    ascorbic acid (VITAMIN C) 250 MG CHEW Chew 250 mg by mouth daily.    aspirin EC 81 MG tablet Take 1 tablet (81 mg total) by mouth daily.   Associated Diagnoses: Chest pain, unspecified    buPROPion (WELLBUTRIN XL) 300 MG 24 hr tablet Take 300 mg by mouth daily.  Refills: 98    carboxymethylcellulose (REFRESH PLUS) 0.5 % SOLN Place 1 drop into both eyes every 2 (two) hours.    Cholecalciferol 4000 UNITS TABS Take 4,000 Units by mouth daily.    citalopram (CELEXA) 40 MG tablet TAKE 1 TABLET BY MOUTH ONCE DAILY. Qty: 90 tablet, Refills: 0    cromolyn (OPTICROM) 4 % ophthalmic solution Place 1 drop into both eyes 4 (four) times daily.   Associated  Diagnoses: Multiple myeloma in remission    esomeprazole (NEXIUM) 40 MG capsule TAKE 1 CAPSULE BY MOUTH DAILY, EACH MORNING FOR ACID INDIGESTION Qty: 30 capsule, Refills: PRN    Ferrous Sulfate 134 MG TABS Take 1 tablet by mouth every other day.     fludrocortisone (FLORINEF) 0.1 MG tablet TAKE 1 TO 2 TABLETS DAILY AS DIRECTED FOR LOW BLOOD PRESSURE. Qty: 60 tablet, Refills: 1    fluticasone (FLONASE) 50 MCG/ACT nasal spray Place 2 sprays into both nostrils at bedtime.     gabapentin (NEURONTIN) 300 MG capsule Take 1 capsule (300 mg total) by mouth at bedtime. Qty: 90 capsule, Refills: 3    hydrOXYzine (ATARAX/VISTARIL) 25 MG tablet Take 1 tablet (25 mg total) by mouth 3 (three) times daily as needed. Qty: 90 tablet, Refills: 1   Associated Diagnoses: Itching    isosorbide mononitrate (IMDUR) 30 MG 24 hr tablet TAKE 1 TABLET BY MOUTH DAILY. Qty: 30 tablet, Refills: 11    levothyroxine (SYNTHROID, LEVOTHROID) 150 MCG tablet TAKE 1 TABLET BY MOUTH ONCE DAILY. Qty: 90 tablet, Refills: 0    loratadine (CLARITIN) 10 MG tablet Take 10  mg by mouth daily.    loteprednol (ALREX) 0.2 % SUSP Place 1 drop into both eyes 4 (four) times daily. Qty: 1 Bottle, Refills: PRN    midodrine (PROAMATINE) 10 MG tablet Take 1 tablet (10 mg total) by mouth 3 (three) times daily. Qty: 90 tablet, Refills: 11    nitroGLYCERIN (NITROSTAT) 0.4 MG SL tablet Place 1 tablet (0.4 mg total) under the tongue every 5 (five) minutes as needed for chest pain. Qty: 25 tablet, Refills: 3   Associated Diagnoses: Chest pain, unspecified    nystatin (MYCOSTATIN/NYSTOP) 100000 UNIT/GM POWD Apply 1 g topically 2 (two) times daily. Apply to belly    ondansetron (ZOFRAN-ODT) 8 MG disintegrating tablet Take 1 tablet (8 mg total) by mouth every 8 (eight) hours as needed for nausea or vomiting. Qty: 20 tablet, Refills: 3    prochlorperazine (COMPAZINE) 10 MG tablet TAKE 1 TABLET BY MOUTH EVERY 6 HOURS AS NEEDED FOR NAUSEA OR  VOMITING. Qty: 60 tablet, Refills: 0    ranitidine (ZANTAC) 300 MG tablet TAKE 1 TABLET BY MOUTH AT BEDTIME, FOR ACID REFLUX AND INDIGESTION. Qty: 30 tablet, Refills: PRN    RESTASIS 0.05 % ophthalmic emulsion Take 1 drop by mouth 2 (two) times daily. Refills: 4    traMADol (ULTRAM) 50 MG tablet Take 1 tablet (50 mg total) by mouth every 6 (six) hours as needed. Qty: 15 tablet, Refills: 0    zolpidem (AMBIEN) 5 MG tablet TAKE 1 TABLET BY MOUTH AT BEDTIME AS NEEDED FOR SLEEP. Qty: 30 tablet, Refills: 0       Allergies  Allergen Reactions  . Codeine Anaphylaxis  . Latex Shortness Of Breath    Adhesive products   . Other Other (See Comments)    Onion, chocolate causes migraines  . Onion Other (See Comments)    Causes migraine headaches  . Adhesive [Tape] Other (See Comments)    blisters  . Iodinated Diagnostic Agents Itching    Happened 60 years ago  . Sulfa Antibiotics Itching      The results of significant diagnostics from this hospitalization (including imaging, microbiology, ancillary and laboratory) are listed below for reference.    Significant Diagnostic Studies: Dg Chest 2 View  04/02/2014   CLINICAL DATA:  Chest pain.  COPD.  Multiple myeloma.  EXAM: CHEST  2 VIEW  COMPARISON:  12/02/2013  FINDINGS: There is a port in the right anterior chest wall. Normal cardiac silhouette with ectatic aorta. No effusion, infiltrate, or pneumothorax.  IMPRESSION: No acute cardiopulmonary process.   Electronically Signed   By: Suzy Bouchard M.D.   On: 04/02/2014 18:57    Microbiology: No results found for this or any previous visit (from the past 240 hour(s)).   Labs: Basic Metabolic Panel:  Recent Labs Lab 03/30/14 0930 04/02/14 1718 04/03/14 0306  NA 143 140 138  K 3.9 3.4* 3.3*  CL  --  111 110  CO2 $Re'22 19 22  'jIW$ GLUCOSE 108 106* 126*  BUN 35.7* 39* 33*  CREATININE 4.2* 4.48* 4.29*  CALCIUM 10.1 9.0 8.3*   Liver Function Tests:  Recent Labs Lab 03/30/14 0930  04/03/14 0306  AST 27 20  ALT 25 26  ALKPHOS 116 71  BILITOT 0.54 0.7  PROT 6.5 4.8*  ALBUMIN 3.8 3.1*   No results for input(s): LIPASE, AMYLASE in the last 168 hours. No results for input(s): AMMONIA in the last 168 hours. CBC:  Recent Labs Lab 03/30/14 0930 04/02/14 1718 04/03/14 0306  WBC 6.8 7.7  6.5  NEUTROABS 4.6 4.8  --   HGB 10.8* 9.8* 8.8*  HCT 33.6* 30.0* 26.6*  MCV 101.2* 98.7 99.6  PLT 210 232 186   Cardiac Enzymes:  Recent Labs Lab 04/02/14 1718 04/02/14 2210 04/03/14 0306 04/03/14 2230 04/04/14 0347  TROPONINI <0.03 <0.03 <0.03 <0.03 <0.03   BNP: BNP (last 3 results) No results for input(s): BNP in the last 8760 hours.  ProBNP (last 3 results) No results for input(s): PROBNP in the last 8760 hours.  CBG: No results for input(s): GLUCAP in the last 168 hours.     Signed:  Farryn Linares A  [facility] 04/04/2014, 12:09 PM

## 2014-04-06 ENCOUNTER — Ambulatory Visit (HOSPITAL_BASED_OUTPATIENT_CLINIC_OR_DEPARTMENT_OTHER): Payer: Medicare Other | Admitting: Internal Medicine

## 2014-04-06 ENCOUNTER — Encounter: Payer: Self-pay | Admitting: Internal Medicine

## 2014-04-06 ENCOUNTER — Telehealth: Payer: Self-pay | Admitting: Internal Medicine

## 2014-04-06 VITALS — BP 116/54 | HR 71 | Temp 98.2°F | Resp 18 | Ht 65.5 in | Wt 169.4 lb

## 2014-04-06 DIAGNOSIS — Z853 Personal history of malignant neoplasm of breast: Secondary | ICD-10-CM

## 2014-04-06 DIAGNOSIS — N186 End stage renal disease: Secondary | ICD-10-CM

## 2014-04-06 DIAGNOSIS — C9000 Multiple myeloma not having achieved remission: Secondary | ICD-10-CM

## 2014-04-06 DIAGNOSIS — C9002 Multiple myeloma in relapse: Secondary | ICD-10-CM | POA: Diagnosis not present

## 2014-04-06 NOTE — Progress Notes (Signed)
[place]  [phone] [phone]  OFFICE PROGRESS NOTE   PRINCIPAL DIAGNOSES:  1. Recurrent multiple myeloma initially diagnosed in 2002, initially with smoldering myeloma at [facility].  2. Ductal carcinoma in situ status post mastectomy with sentinel lymph node biopsy in October 2008.  PRIOR THERAPY:  1. Status post treatment with tamoxifen from November 2008 through February 2009, discontinued secondary to intolerance. 2. Status post 3 cycles of chemotherapy with Revlimid and Decadron followed by 1 cycle of Decadron only with mild response. 3. Status post 2 cycles of systemic chemotherapy with Velcade, Doxil and Decadron discontinued secondary to significant peripheral neuropathy. Last dose was given May 2010 at [facility]. 4. Status post autologous peripheral blood stem cell transplant on October 01, 2008 at [facility] under the care of Dr. Phyllis Ginger.  5. Systemic chemotherapy with Carfilzomib initially was 20 mg/M2 and will be increased after cycle #1 to 36 mg/M2 on days 1, 2, 8, 9, 15 and 16 every 4 weeks in addition to Cytoxan 300 mg/M2 and Decadron 40 mg by mouth weekly basis, status post 4 cycles. First cycle on 12/29/2012.    CURRENT THERAPY: Observation.   INTERVAL HISTORY: Nancy Norris 74 y.o. female returns to the clinic today for followup visit. She is feeling fine today with no specific complaints. She was seen recently in the emergency department for hypotension and the patient was started on midodrine by her cardiologist. Her blood pressure is much better today. She is followed by nephrology for her renal insufficiency but the patient has not started dialysis yet. She denied having any significant nausea or vomiting, no fever or chills. The patient denied having any chest pain, shortness of breath, cough or hemoptysis. No significant weight loss or night sweats. She had repeat myeloma panel performed  recently and she is here for evaluation and discussion of her lab results.   MEDICAL HISTORY: Past Medical History  Diagnosis Date  . Hyperkalemia   . Hypothyroidism   . COPD (chronic obstructive pulmonary disease)   . Hyponatremia   . Dizziness   . Fibromyalgia   . Breast cancer   . Multiple myeloma   . Mucositis   . Hx of echocardiogram     a.  Echocardiogram (12/26/2012): EF 63-01%, grade 1 diastolic dysfunction;   b.  Echocardiogram (02/2013): EF 55-60%, no WMA, trivial effusion  . Fibromyalgia   . Arthritis   . Hx of cardiovascular stress test     LexiScan with low level exercise Myoview (02/2013): No ischemia, EF 72%; normal study  . Complication of anesthesia   . PONV (postoperative nausea and vomiting) 2008    after mastestomy  . Myocardial infarction     in past, patient was unaware.   . Anginal pain     used NTG x 2 May 31 and 06/15/13   . GERD (gastroesophageal reflux disease)   . Headache(784.0)   . Anxiety   . Depression   . CKD (chronic kidney disease) stage 3, GFR 30-59 ml/min   . Anemia   . History of blood transfusion     last one May 12   . Neuropathy     ALLERGIES:  is allergic to codeine; latex; other; onion; adhesive; iodinated diagnostic agents; and sulfa antibiotics.  MEDICATIONS:  Current Outpatient Prescriptions  Medication Sig Dispense Refill  . acetaminophen (TYLENOL) 325 MG tablet Take 650 mg by mouth every 6 (six) hours as needed for mild pain or moderate pain.     Marland Kitchen  albuterol (PROVENTIL HFA;VENTOLIN HFA) 108 (90 BASE) MCG/ACT inhaler Inhale 2 puffs into the lungs 2 (two) times daily as needed for wheezing or shortness of breath. 1 each 6  . ascorbic acid (VITAMIN C) 250 MG CHEW Chew 250 mg by mouth daily.    Marland Kitchen aspirin EC 81 MG tablet Take 1 tablet (81 mg total) by mouth daily.    Marland Kitchen buPROPion (WELLBUTRIN XL) 300 MG 24 hr tablet Take 300 mg by mouth daily.   98  . carboxymethylcellulose (REFRESH PLUS) 0.5 % SOLN Place 1 drop into both eyes every  2 (two) hours.    . Cholecalciferol 4000 UNITS TABS Take 4,000 Units by mouth daily.    . citalopram (CELEXA) 40 MG tablet TAKE 1 TABLET BY MOUTH ONCE DAILY. 90 tablet 0  . cromolyn (OPTICROM) 4 % ophthalmic solution Place 1 drop into both eyes 4 (four) times daily.    Marland Kitchen esomeprazole (NEXIUM) 40 MG capsule TAKE 1 CAPSULE BY MOUTH DAILY, EACH MORNING FOR ACID INDIGESTION 30 capsule PRN  . Ferrous Sulfate 134 MG TABS Take 1 tablet by mouth every other day.     . fludrocortisone (FLORINEF) 0.1 MG tablet TAKE 1 TO 2 TABLETS DAILY AS DIRECTED FOR LOW BLOOD PRESSURE. 60 tablet 1  . fluticasone (FLONASE) 50 MCG/ACT nasal spray Place 2 sprays into both nostrils at bedtime.     . gabapentin (NEURONTIN) 300 MG capsule Take 1 capsule (300 mg total) by mouth at bedtime. 90 capsule 3  . hydrOXYzine (ATARAX/VISTARIL) 25 MG tablet Take 1 tablet (25 mg total) by mouth 3 (three) times daily as needed. 90 tablet 1  . isosorbide mononitrate (IMDUR) 30 MG 24 hr tablet TAKE 1 TABLET BY MOUTH DAILY. 30 tablet 11  . levothyroxine (SYNTHROID, LEVOTHROID) 150 MCG tablet TAKE 1 TABLET BY MOUTH ONCE DAILY. (Patient taking differently: TAKE 1 TABLET BY MOUTH ONCE DAILY., 4 times a week and 1 and 1/2 Th, Sat, Sun) 90 tablet 0  . loratadine (CLARITIN) 10 MG tablet Take 10 mg by mouth daily.    Marland Kitchen loteprednol (ALREX) 0.2 % SUSP Place 1 drop into both eyes 4 (four) times daily. 1 Bottle PRN  . meclizine (ANTIVERT) 32 MG tablet Take 1 tablet (32 mg total) by mouth 3 (three) times daily as needed. 30 tablet 0  . midodrine (PROAMATINE) 10 MG tablet Take 1 tablet (10 mg total) by mouth 3 (three) times daily. 90 tablet 11  . nitroGLYCERIN (NITROSTAT) 0.4 MG SL tablet Place 1 tablet (0.4 mg total) under the tongue every 5 (five) minutes as needed for chest pain. 25 tablet 3  . nystatin (MYCOSTATIN/NYSTOP) 100000 UNIT/GM POWD Apply 1 g topically 2 (two) times daily. Apply to belly    . ondansetron (ZOFRAN-ODT) 8 MG disintegrating tablet  Take 1 tablet (8 mg total) by mouth every 8 (eight) hours as needed for nausea or vomiting. 20 tablet 3  . prochlorperazine (COMPAZINE) 10 MG tablet TAKE 1 TABLET BY MOUTH EVERY 6 HOURS AS NEEDED FOR NAUSEA OR VOMITING. (Patient taking differently: TAKE 1 TABLET BY MOUTH EVERY AT BEDTIME AS NEEDED FOR NAUSEA OR VOMITING.) 60 tablet 0  . ranitidine (ZANTAC) 300 MG tablet TAKE 1 TABLET BY MOUTH AT BEDTIME, FOR ACID REFLUX AND INDIGESTION. 30 tablet PRN  . RESTASIS 0.05 % ophthalmic emulsion Take 1 drop by mouth 2 (two) times daily.  4  . traMADol (ULTRAM) 50 MG tablet Take 1 tablet (50 mg total) by mouth every 6 (six) hours as needed. (  Patient taking differently: Take 50 mg by mouth every 6 (six) hours as needed for moderate pain. ) 15 tablet 0  . zolpidem (AMBIEN) 5 MG tablet TAKE 1 TABLET BY MOUTH AT BEDTIME AS NEEDED FOR SLEEP. 30 tablet 0   No current facility-administered medications for this visit.   Facility-Administered Medications Ordered in Other Visits  Medication Dose Route Frequency Provider Last Rate Last Dose  . 0.9 %  sodium chloride infusion   Intravenous Once Adrena E Johnson, PA-C      . sodium chloride 0.9 % injection 10 mL  10 mL Intracatheter PRN Curt Bears, MD   10 mL at 01/05/13 1825    SURGICAL HISTORY:  Past Surgical History  Procedure Laterality Date  . History of port removal    . Status post stem cell transplant on September 28, 2008.    Marland Kitchen Abdominal hysterectomy  1981  . Cholecystectomy  1971  . Mastectomy Left 2008  . Cataract extraction, bilateral    . Breast surgery    . Eye surgery Bilateral     lens implant  . Breast reconstruction    . Portacath placement  12/2012    has had 2  . Av fistula placement Left 06/19/2013    Procedure: CREATION OF LEFT ARM ARTERIOVENOUS (AV) FISTULA ;  Surgeon: Angelia Mould, MD;  Location: [facility] OR;  Service: Vascular;  Laterality: Left;    REVIEW OF SYSTEMS:  A comprehensive review of systems was negative.    PHYSICAL EXAMINATION: General appearance: alert, cooperative and no distress Head: Normocephalic, without obvious abnormality, atraumatic Neck: no adenopathy Lymph nodes: Cervical, supraclavicular, and axillary nodes normal. Resp: clear to auscultation bilaterally Back: symmetric, no curvature. ROM normal. No CVA tenderness. Cardio: regular rate and rhythm, S1, S2 normal, no murmur, click, rub or gallop GI: soft, non-tender; bowel sounds normal; no masses,  no organomegaly Extremities: extremities normal, atraumatic, no cyanosis or edema Neurologic: Alert and oriented X 3, normal strength and tone. Normal symmetric reflexes. Normal coordination and gait  ECOG PERFORMANCE STATUS: 1 - Symptomatic but completely ambulatory  Blood pressure 116/54, pulse 71, temperature 98.2 F (36.8 C), temperature source Oral, resp. rate 18, height 5' 5.5" (1.664 m), weight 169 lb 6.4 oz (76.839 kg), SpO2 98 %.  LABORATORY DATA: Lab Results  Component Value Date   WBC 6.5 04/03/2014   HGB 8.8* 04/03/2014   HCT 26.6* 04/03/2014   MCV 99.6 04/03/2014   PLT 186 04/03/2014      Chemistry      Component Value Date/Time   NA 138 04/03/2014 0306   NA 143 03/30/2014 0930   K 3.3* 04/03/2014 0306   K 3.9 03/30/2014 0930   CL 110 04/03/2014 0306   CL 105 03/19/2012 0811   CO2 22 04/03/2014 0306   CO2 22 03/30/2014 0930   BUN 33* 04/03/2014 0306   BUN 35.7* 03/30/2014 0930   CREATININE 4.29* 04/03/2014 0306   CREATININE 4.2* 03/30/2014 0930   CREATININE 3.87* 03/23/2014 1558      Component Value Date/Time   CALCIUM 8.3* 04/03/2014 0306   CALCIUM 10.1 03/30/2014 0930   ALKPHOS 71 04/03/2014 0306   ALKPHOS 116 03/30/2014 0930   AST 20 04/03/2014 0306   AST 27 03/30/2014 0930   ALT 26 04/03/2014 0306   ALT 25 03/30/2014 0930   BILITOT 0.7 04/03/2014 0306   BILITOT 0.54 03/30/2014 0930     Other lab results: Beta-2 microglobulin 10.80, free kappa light chain 5.67, free lambda  light chain  1.05, kappa/lambda ratio 5.40. IgG 688 gA 22 and IgM 31   RADIOGRAPHIC STUDIES:  ASSESSMENT AND PLAN: This is a very pleasant 74 years old white female with relapsed multiple myeloma. She is currently undergoing systemic chemotherapy with Carfilzomib, Cytoxan and dexamethasone status post 4 cycles. Her myeloma panel showed mild increase in the free kappa light chain. The patient is not interested in resuming any treatment at this point. I discussed the lab result with the patient today and recommended for her to continue on observation with repeat myeloma panel in 3 months. For the end-stage renal disease, the patient is followed by [facility].  She was advised to call immediately if she has any concerning symptoms in the interval.  Disclaimer: This note was dictated with voice recognition software. Similar sounding words can inadvertently be transcribed and may not be corrected upon review.  [name]., MD 04/06/2014

## 2014-04-06 NOTE — Telephone Encounter (Signed)
Pt confirmed labs/ov per 03/22 POF, gave pt AVS and calendar..... KJ

## 2014-04-09 ENCOUNTER — Ambulatory Visit (HOSPITAL_COMMUNITY): Payer: Medicare Other | Attending: Cardiology

## 2014-04-09 ENCOUNTER — Encounter (HOSPITAL_COMMUNITY): Payer: Medicare Other

## 2014-04-09 ENCOUNTER — Ambulatory Visit (HOSPITAL_BASED_OUTPATIENT_CLINIC_OR_DEPARTMENT_OTHER): Payer: Medicare Other | Admitting: *Deleted

## 2014-04-09 ENCOUNTER — Encounter: Payer: Self-pay | Admitting: Internal Medicine

## 2014-04-09 DIAGNOSIS — N184 Chronic kidney disease, stage 4 (severe): Secondary | ICD-10-CM | POA: Diagnosis not present

## 2014-04-09 DIAGNOSIS — R0602 Shortness of breath: Secondary | ICD-10-CM

## 2014-04-09 DIAGNOSIS — R0989 Other specified symptoms and signs involving the circulatory and respiratory systems: Secondary | ICD-10-CM | POA: Diagnosis not present

## 2014-04-09 NOTE — Progress Notes (Signed)
2D Echo completed. 04/09/2014

## 2014-04-09 NOTE — Progress Notes (Signed)
Carotid Duplex Exam Performed 

## 2014-04-16 ENCOUNTER — Telehealth: Payer: Self-pay | Admitting: Internal Medicine

## 2014-04-16 ENCOUNTER — Telehealth: Payer: Self-pay | Admitting: *Deleted

## 2014-04-16 DIAGNOSIS — N2581 Secondary hyperparathyroidism of renal origin: Secondary | ICD-10-CM | POA: Diagnosis not present

## 2014-04-16 DIAGNOSIS — C9000 Multiple myeloma not having achieved remission: Secondary | ICD-10-CM

## 2014-04-16 DIAGNOSIS — R809 Proteinuria, unspecified: Secondary | ICD-10-CM | POA: Diagnosis not present

## 2014-04-16 DIAGNOSIS — N184 Chronic kidney disease, stage 4 (severe): Secondary | ICD-10-CM | POA: Diagnosis not present

## 2014-04-16 DIAGNOSIS — D630 Anemia in neoplastic disease: Secondary | ICD-10-CM | POA: Diagnosis not present

## 2014-04-16 NOTE — Telephone Encounter (Signed)
Spoke with patient and she is aware of her upcoming appointments

## 2014-04-16 NOTE — Telephone Encounter (Signed)
Onc tx request sent

## 2014-04-16 NOTE — Telephone Encounter (Signed)
Patient called and stated,"please tell Dr. Julien Nordmann that I want to do chemotherapy. I want to start now." I informed patient that this message will be given to Dr. Julien Nordmann and his nurse. Patient verbalized understanding.

## 2014-04-16 NOTE — Telephone Encounter (Signed)
Please arrange F/U appointment with APP or me to discuss it before starting.

## 2014-04-16 NOTE — Telephone Encounter (Signed)
Sent request to scheduler

## 2014-04-20 ENCOUNTER — Ambulatory Visit (INDEPENDENT_AMBULATORY_CARE_PROVIDER_SITE_OTHER): Payer: Medicare Other | Admitting: Internal Medicine

## 2014-04-20 ENCOUNTER — Encounter: Payer: Self-pay | Admitting: Internal Medicine

## 2014-04-20 VITALS — BP 118/58 | HR 72 | Temp 97.7°F | Resp 16 | Ht 65.0 in | Wt 166.6 lb

## 2014-04-20 DIAGNOSIS — R7303 Prediabetes: Secondary | ICD-10-CM

## 2014-04-20 DIAGNOSIS — E782 Mixed hyperlipidemia: Secondary | ICD-10-CM

## 2014-04-20 DIAGNOSIS — I10 Essential (primary) hypertension: Secondary | ICD-10-CM

## 2014-04-20 DIAGNOSIS — Z1212 Encounter for screening for malignant neoplasm of rectum: Secondary | ICD-10-CM

## 2014-04-20 DIAGNOSIS — F329 Major depressive disorder, single episode, unspecified: Secondary | ICD-10-CM

## 2014-04-20 DIAGNOSIS — F32A Depression, unspecified: Secondary | ICD-10-CM

## 2014-04-20 DIAGNOSIS — Z9181 History of falling: Secondary | ICD-10-CM

## 2014-04-20 DIAGNOSIS — E559 Vitamin D deficiency, unspecified: Secondary | ICD-10-CM

## 2014-04-20 DIAGNOSIS — E039 Hypothyroidism, unspecified: Secondary | ICD-10-CM | POA: Diagnosis not present

## 2014-04-20 DIAGNOSIS — Z79899 Other long term (current) drug therapy: Secondary | ICD-10-CM

## 2014-04-20 DIAGNOSIS — Z1331 Encounter for screening for depression: Secondary | ICD-10-CM

## 2014-04-20 DIAGNOSIS — R7309 Other abnormal glucose: Secondary | ICD-10-CM

## 2014-04-20 LAB — HEMOGLOBIN A1C
HEMOGLOBIN A1C: 5.1 % (ref <5.7)
Mean Plasma Glucose: 100 mg/dL (ref <117)

## 2014-04-20 LAB — LIPID PANEL
CHOLESTEROL: 151 mg/dL (ref 0–200)
HDL: 31 mg/dL — ABNORMAL LOW (ref >=46)
LDL Cholesterol: 98 mg/dL (ref 0–99)
Total CHOL/HDL Ratio: 4.9 Ratio
Triglycerides: 108 mg/dL (ref <150)
VLDL: 22 mg/dL (ref 0–40)

## 2014-04-20 LAB — TSH: TSH: 0.051 u[IU]/mL — AB (ref 0.350–4.500)

## 2014-04-20 LAB — MAGNESIUM: Magnesium: 2.1 mg/dL (ref 1.5–2.5)

## 2014-04-20 NOTE — Patient Instructions (Signed)
Recommend the book "The END of DIETING" by Dr Excell Seltzer   & the book "The END of DIABETES " by Dr Excell Seltzer  At [email or web address] - get book & Audio CD's      Being diabetic has a  300% increased risk for heart attack, stroke, cancer, and alzheimer- type vascular dementia. It is very important that you work harder with diet by avoiding all foods that are white. Avoid white rice (brown & wild rice is OK), white potatoes (sweetpotatoes in moderation is OK), White bread or wheat bread or anything made out of white flour like bagels, donuts, rolls, buns, biscuits, cakes, pastries, cookies, pizza crust, and pasta (made from white flour & egg whites) - vegetarian pasta or spinach or wheat pasta is OK. Multigrain breads like Arnold's or [employer], or multigrain sandwich thins or flatbreads.  Diet, exercise and weight loss can reverse and cure diabetes in the early stages.  Diet, exercise and weight loss is very important in the control and prevention of complications of diabetes which affects every system in your body, ie. Brain - dementia/stroke, eyes - glaucoma/blindness, heart - heart attack/heart failure, kidneys - dialysis, stomach - gastric paralysis, intestines - malabsorption, nerves - severe painful neuritis, circulation - gangrene & loss of a leg(s), and finally cancer and Alzheimers.    I recommend avoid fried & greasy foods,  sweets/candy, white rice (brown or wild rice or Quinoa is OK), white potatoes (sweet potatoes are OK) - anything made from white flour - bagels, doughnuts, rolls, buns, biscuits,white and wheat breads, pizza crust and traditional pasta made of white flour & egg white(vegetarian pasta or spinach or wheat pasta is OK).  Multi-grain bread is OK - like multi-grain flat bread or sandwich thins. Avoid alcohol in excess. Exercise is also important.    Eat all the vegetables you want - avoid meat, especially red meat and dairy - especially cheese.  Cheese is the most  concentrated form of trans-fats which is the worst thing to clog up our arteries. Veggie cheese is OK which can be found in the fresh produce section at [employer] or [employer] or [employer]  Preventive Care for Adults A healthy lifestyle and preventive care can promote health and wellness. Preventive health guidelines for women include the following key practices.  A routine yearly physical is a good way to check with your health care provider about your health and preventive screening. It is a chance to share any concerns and updates on your health and to receive a thorough exam.  Visit your dentist for a routine exam and preventive care every 6 months. Brush your teeth twice a day and floss once a day. Good oral hygiene prevents tooth decay and gum disease.  The frequency of eye exams is based on your age, health, family medical history, use of contact lenses, and other factors. Follow your health care provider's recommendations for frequency of eye exams.  Eat a healthy diet. Foods like vegetables, fruits, whole grains, low-fat dairy products, and lean protein foods contain the nutrients you need without too many calories. Decrease your intake of foods high in solid fats, added sugars, and salt. Eat the right amount of calories for you.Get information about a proper diet from your health care provider, if necessary.  Regular physical exercise is one of the most important things you can do for your health. Most adults should get at least 150 minutes of moderate-intensity exercise (any activity that increases your heart rate and  causes you to sweat) each week. In addition, most adults need muscle-strengthening exercises on 2 or more days a week.  Maintain a healthy weight. The body mass index (BMI) is a screening tool to identify possible weight problems. It provides an estimate of body fat based on height and weight. Your health care provider can find your BMI and can help you achieve or  maintain a healthy weight.For adults 20 years and older:  A BMI below 18.5 is considered underweight.  A BMI of 18.5 to 24.9 is normal.  A BMI of 25 to 29.9 is considered overweight.  A BMI of 30 and above is considered obese.  Maintain normal blood lipids and cholesterol levels by exercising and minimizing your intake of saturated fat. Eat a balanced diet with plenty of fruit and vegetables. If your lipid or cholesterol levels are high, you are over 50, or you are at high risk for heart disease, you may need your cholesterol levels checked more frequently.Ongoing high lipid and cholesterol levels should be treated with medicines if diet and exercise are not working.  If you smoke, find out from your health care provider how to quit. If you do not use tobacco, do not start.  Lung cancer screening is recommended for adults aged 55-80 years who are at high risk for developing lung cancer because of a history of smoking. A yearly low-dose CT scan of the lungs is recommended for people who have at least a 30-pack-year history of smoking and are a current smoker or have quit within the past 15 years. A pack year of smoking is smoking an average of 1 pack of cigarettes a day for 1 year (for example: 1 pack a day for 30 years or 2 packs a day for 15 years). Yearly screening should continue until the smoker has stopped smoking for at least 15 years. Yearly screening should be stopped for people who develop a health problem that would prevent them from having lung cancer treatment.  Avoid use of street drugs. Do not share needles with anyone. Ask for help if you need support or instructions about stopping the use of drugs.  High blood pressure causes heart disease and increases the risk of stroke.  Ongoing high blood pressure should be treated with medicines if weight loss and exercise do not work.  If you are 55-79 years old, ask your health care provider if you should take aspirin to prevent  strokes.  Diabetes screening involves taking a blood sample to check your fasting blood sugar level. This should be done once every 3 years, after age 45, if you are within normal weight and without risk factors for diabetes. Testing should be considered at a younger age or be carried out more frequently if you are overweight and have at least 1 risk factor for diabetes.  Breast cancer screening is essential preventive care for women. You should practice "breast self-awareness." This means understanding the normal appearance and feel of your breasts and may include breast self-examination. Any changes detected, no matter how small, should be reported to a health care provider. Women in their 20s and 30s should have a clinical breast exam (CBE) by a health care provider as part of a regular health exam every 1 to 3 years. After age 40, women should have a CBE every year. Starting at age 40, women should consider having a mammogram (breast X-ray test) every year. Women who have a family history of breast cancer should talk to their health   care provider about genetic screening. Women at a high risk of breast cancer should talk to their health care providers about having an MRI and a mammogram every year.  Breast cancer gene (BRCA)-related cancer risk assessment is recommended for women who have family members with BRCA-related cancers. BRCA-related cancers include breast, ovarian, tubal, and peritoneal cancers. Having family members with these cancers may be associated with an increased risk for harmful changes (mutations) in the breast cancer genes BRCA1 and BRCA2. Results of the assessment will determine the need for genetic counseling and BRCA1 and BRCA2 testing.  Routine pelvic exams to screen for cancer are no longer recommended for nonpregnant women who are considered low risk for cancer of the pelvic organs (ovaries, uterus, and vagina) and who do not have symptoms. Ask your health care provider if a  screening pelvic exam is right for you.  If you have had past treatment for cervical cancer or a condition that could lead to cancer, you need Pap tests and screening for cancer for at least 20 years after your treatment. If Pap tests have been discontinued, your risk factors (such as having a new sexual partner) need to be reassessed to determine if screening should be resumed. Some women have medical problems that increase the chance of getting cervical cancer. In these cases, your health care provider may recommend more frequent screening and Pap tests.    Colorectal cancer can be detected and often prevented. Most routine colorectal cancer screening begins at the age of 100 years and continues through age 42 years. However, your health care provider may recommend screening at an earlier age if you have risk factors for colon cancer. On a yearly basis, your health care provider may provide home test kits to check for hidden blood in the stool. Use of a small camera at the end of a tube, to directly examine the colon (sigmoidoscopy or colonoscopy), can detect the earliest forms of colorectal cancer. Talk to your health care provider about this at age 8, when routine screening begins. Direct exam of the colon should be repeated every 5-10 years through age 30 years, unless early forms of pre-cancerous polyps or small growths are found.  Osteoporosis is a disease in which the bones lose minerals and strength with aging. This can result in serious bone fractures or breaks. The risk of osteoporosis can be identified using a bone density scan. Women ages 53 years and over and women at risk for fractures or osteoporosis should discuss screening with their health care providers. Ask your health care provider whether you should take a calcium supplement or vitamin D to reduce the rate of osteoporosis.  Menopause can be associated with physical symptoms and risks. Hormone replacement therapy is available to  decrease symptoms and risks. You should talk to your health care provider about whether hormone replacement therapy is right for you.  Use sunscreen. Apply sunscreen liberally and repeatedly throughout the day. You should seek shade when your shadow is shorter than you. Protect yourself by wearing long sleeves, pants, a wide-brimmed hat, and sunglasses year round, whenever you are outdoors.  Once a month, do a whole body skin exam, using a mirror to look at the skin on your back. Tell your health care provider of new moles, moles that have irregular borders, moles that are larger than a pencil eraser, or moles that have changed in shape or color.  Stay current with required vaccines (immunizations).  Influenza vaccine. All adults should be immunized every  year.  Tetanus, diphtheria, and acellular pertussis (Td, Tdap) vaccine. Pregnant women should receive 1 dose of Tdap vaccine during each pregnancy. The dose should be obtained regardless of the length of time since the last dose. Immunization is preferred during the 27th-36th week of gestation. An adult who has not previously received Tdap or who does not know her vaccine status should receive 1 dose of Tdap. This initial dose should be followed by tetanus and diphtheria toxoids (Td) booster doses every 10 years. Adults with an unknown or incomplete history of completing a 3-dose immunization series with Td-containing vaccines should begin or complete a primary immunization series including a Tdap dose. Adults should receive a Td booster every 10 years.    Zoster vaccine. One dose is recommended for adults aged 56 years or older unless certain conditions are present.    Pneumococcal 13-valent conjugate (PCV13) vaccine. When indicated, a person who is uncertain of her immunization history and has no record of immunization should receive the PCV13 vaccine. An adult aged 32 years or older who has certain medical conditions and has not been previously  immunized should receive 1 dose of PCV13 vaccine. This PCV13 should be followed with a dose of pneumococcal polysaccharide (PPSV23) vaccine. The PPSV23 vaccine dose should be obtained at least 8 weeks after the dose of PCV13 vaccine. An adult aged 42 years or older who has certain medical conditions and previously received 1 or more doses of PPSV23 vaccine should receive 1 dose of PCV13. The PCV13 vaccine dose should be obtained 1 or more years after the last PPSV23 vaccine dose.    Pneumococcal polysaccharide (PPSV23) vaccine. When PCV13 is also indicated, PCV13 should be obtained first. All adults aged 48 years and older should be immunized. An adult younger than age 59 years who has certain medical conditions should be immunized. Any person who resides in a nursing home or long-term care facility should be immunized. An adult smoker should be immunized. People with an immunocompromised condition and certain other conditions should receive both PCV13 and PPSV23 vaccines. People with human immunodeficiency virus (HIV) infection should be immunized as soon as possible after diagnosis. Immunization during chemotherapy or radiation therapy should be avoided. Routine use of PPSV23 vaccine is not recommended for American Indians, [place]atives, or people younger than 65 years unless there are medical conditions that require PPSV23 vaccine. When indicated, people who have unknown immunization and have no record of immunization should receive PPSV23 vaccine. One-time revaccination 5 years after the first dose of PPSV23 is recommended for people aged 19-64 years who have chronic kidney failure, nephrotic syndrome, asplenia, or immunocompromised conditions. People who received 1-2 doses of PPSV23 before age 50 years should receive another dose of PPSV23 vaccine at age 76 years or later if at least 5 years have passed since the previous dose. Doses of PPSV23 are not needed for people immunized with PPSV23 at or after  age 46 years.   Preventive Services / Frequency  Ages 82 years and over  Blood pressure check.  Lipid and cholesterol check.  Lung cancer screening. / Every year if you are aged 21-80 years and have a 30-pack-year history of smoking and currently smoke or have quit within the past 15 years. Yearly screening is stopped once you have quit smoking for at least 15 years or develop a health problem that would prevent you from having lung cancer treatment.  Clinical breast exam.** / Every year after age 41 years.  BRCA-related cancer risk assessment.** /  For women who have family members with a BRCA-related cancer (breast, ovarian, tubal, or peritoneal cancers).  Mammogram.** / Every year beginning at age 40 years and continuing for as long as you are in good health. Consult with your health care provider.  Pap test.** / Every 3 years starting at age 30 years through age 65 or 70 years with 3 consecutive normal Pap tests. Testing can be stopped between 65 and 70 years with 3 consecutive normal Pap tests and no abnormal Pap or HPV tests in the past 10 years.  Fecal occult blood test (FOBT) of stool. / Every year beginning at age 50 years and continuing until age 75 years. You may not need to do this test if you get a colonoscopy every 10 years.  Flexible sigmoidoscopy or colonoscopy.** / Every 5 years for a flexible sigmoidoscopy or every 10 years for a colonoscopy beginning at age 50 years and continuing until age 75 years.  Hepatitis C blood test.** / For all people born from 1945 through 1965 and any individual with known risks for hepatitis C.  Osteoporosis screening.** / A one-time screening for women ages 65 years and over and women at risk for fractures or osteoporosis.  Skin self-exam. / Monthly.  Influenza vaccine. / Every year.  Tetanus, diphtheria, and acellular pertussis (Tdap/Td) vaccine.** / 1 dose of Td every 10 years.  Zoster vaccine.** / 1 dose for adults aged 60 years  or older.  Pneumococcal 13-valent conjugate (PCV13) vaccine.** / Consult your health care provider.  Pneumococcal polysaccharide (PPSV23) vaccine.** / 1 dose for all adults aged 65 years and older. Screening for abdominal aortic aneurysm (AAA)  by ultrasound is recommended for people who have history of high blood pressure or who are current or former smokers. 

## 2014-04-20 NOTE — Progress Notes (Signed)
Patient ID: Nancy Norris, female   DOB: [birth year], 74 y.o.   MRN: [id number] This very nice 74 y.o.female presents for 3 month follow up with Hypertension/Hypotension (Dysautonamia), Hyperlipidemia, Pre-Diabetes and Vitamin D Deficiency.    Annual Comprehensive Examination  This very nice 74 y.o. DWF presents for complete physical.  Patient has been followed for HTN, T2_NIDDM  Prediabetes, Hyperlipidemia, and Vitamin D Deficiency. Patient has hx/o Multiple Myeloma since 2002 followed & managed by Dr Earlie Server and is also followed closely by Dr Carollee Leitz - Nephrology for CKD5 (GFR 10 ml/min) - attributed to chemotx and her MM. In 2008 she had a Lt Mastectomy/TRAM for ductal CISand then in 2010 she underwent autologous Stem Cell Transplant. In June 2015 she had creation of a LUE AVF alto she state adamantly that she would not undergo Dialysis and doe not want CPR or any life support measures transcending [employer].    Patient has hx /o labile BP attributed to dysautonomia in the past and has done fairly well on Florinef the last 4+ years until recently hospitalized last month with refractory hypotension and was begun on Midodrine. Patient denies any cardiac symptoms as chest pain, palpitations, shortness of breath or ankle swelling. Today's BP was 118/58 sitting.     Patient's hyperlipidemia is controlled with diet and medications. Patient denies myalgias or other medication SE's. Last lipids were 01/11/2014: Cholesterol, Total 135; HDL-C 30*; LDL (calc) 83; Triglycerides 108 on    Patient has T2_NIDDM prediabetes predating since May 2012 with A1c 5.8%  and later at 6.0% in Mar 2013 and patient denies reactive hypoglycemic symptoms, visual blurring, diabetic polys or paresthesias. Last A1c was 01/11/2014: Hemoglobin-A1c 5.0% on    Finally, patient has history of Vitamin D Deficiency of 46 on treatment in 2012 and last Vitamin D was 68 on 01/11/2014.  Medication Sig  .  acetaminophen  325 MG tablet Take 650 mg by mouth every 6 (six) hours as needed for mild pain or moderate pain.   Marland Kitchen albuterol  HFA) 108  inhaler Inhale 2 puffs into the lungs 2 (two) times daily as needed for wheezing or shortness of breath.  Marland Kitchen VITAMIN C 250 MG CHEW Chew 250 mg by mouth daily.  Marland Kitchen aspirin EC 81 MG tablet Take 1 tablet (81 mg total) by mouth daily.  Marland Kitchen buPROPion  XL 300 MG  Take 300 mg by mouth daily.   Marland Kitchen REFRESH PLUS 0.5 % SOLN Place 1 drop into both eyes every 2 (two) hours.  . Cholecalciferol 4000 UNITS TABS Take 4,000 Units by mouth daily.  . citalopram  40 MG tablet TAKE 1 TABLET BY MOUTH ONCE DAILY.  . cromolyn (OPTICROM) 4 % ophthalmic solution Place 1 drop into both eyes 4 (four) times daily.  Marland Kitchen esomeprazole  40 MG capsule TAKE 1 CAPSULE BY MOUTH DAILY, EACH MORNING FOR ACID INDIGESTION  . Ferrous Sulfate 134 MG TABS Take 1 tablet by mouth every other day.   . fludrocortisone (FLORINEF) 0.1 MG tablet TAKE 1 TO 2 TABLETS DAILY AS DIRECTED FOR LOW BLOOD PRESSURE.  Marland Kitchen FLONASE nasal spray Place 2 sprays into both nostrils at bedtime.   . gabapentin  300 MG capsule Take 1 capsule (300 mg total) by mouth at bedtime.  . isosorbide mononit (IMDUR) 30 MG TAKE 1 TABLET BY MOUTH DAILY.  Marland Kitchen levothyroxine  150 MCG tablet  TAKE 1 TABLET BY MOUTH ONCE DAILY., 4 times a week and 1 and 1/2 Th, Sat, Sun)  . loratadine  10 MG tablet Take 10 mg by mouth daily.  Marland Kitchen loteprednol (ALREX) 0.2 % SUSP Place 1 drop into both eyes 4 (four) times daily.  . meclizine  32 MG tablet Take 1 tablet (32 mg total) by mouth 3 (three) times daily as needed.  . midodrine (PROAMATINE) 10 MG tablet Take 1 tablet (10 mg total) by mouth 3 (three) times daily.  . nitroGLYCERIN 0.4 MG SL tablet Place 1 tablet (0.4 mg total) under the tongue every 5 (five) minutes as needed for chest pain.  Marland Kitchen Nystatin POWD Apply 1 g topically 2 (two) times daily. Apply to belly  . ondansetron (ZOFRAN-ODT) 8 MG  Take 1 tablet (8 mg total)  by mouth every 8 (eight) hours as needed for nausea or vomiting.  Marland Kitchen MIRALAX   Take 17 g by mouth daily.  . prochlorperazine (COMPAZINE) 10 MG tablet TAKE 1 TABLET BY MOUTH EVERY 6 HOURS AS NEEDED FOR NAUSEA OR VOMITING.   . ranitidine (ZANTAC) 300 MG tablet TAKE 1 TABLET BY MOUTH AT BEDTIME, FOR ACID REFLUX AND INDIGESTION.  . RESTASIS 0.05 % ophthalmic emulsion Take 1 drop by mouth 2 (two) times daily.  Marland Kitchen zolpidem (AMBIEN) 5 MG tablet TAKE 1 TABLET BY MOUTH AT BEDTIME AS NEEDED FOR SLEEP.   Allergies  Allergen Reactions  . Codeine Anaphylaxis  . Latex Shortness Of Breath    Adhesive products   . Other Other (See Comments)    Onion, chocolate causes migraines  . Onion Other (See Comments)    Causes migraine headaches  . Adhesive [Tape] Other (See Comments)    blisters  . Iodinated Diagnostic Agents Itching    Happened 60 years ago  . Sulfa Antibiotics Itching   Past Medical History  Diagnosis Date  . Hyperkalemia   . Hypothyroidism   . COPD (chronic obstructive pulmonary disease)   . Hyponatremia   . Dizziness   . Fibromyalgia   . Breast cancer   . Multiple myeloma   . Mucositis   . Hx of echocardiogram     a.  Echocardiogram (12/26/2012): EF 60-65%, grade 1 diastolic dysfunction;   b.  Echocardiogram (02/2013): EF 55-60%, no WMA, trivial effusion  . Fibromyalgia   . Arthritis   . Hx of cardiovascular stress test     LexiScan with low level exercise Myoview (02/2013): No ischemia, EF 72%; normal study  . Complication of anesthesia   . PONV (postoperative nausea and vomiting) 2008    after mastestomy  . Myocardial infarction     in past, patient was unaware.   . Anginal pain     used NTG x 2 May 31 and 06/15/13   . GERD (gastroesophageal reflux disease)   . Headache(784.0)   . Anxiety   . Depression   . CKD (chronic kidney disease) stage 3, GFR 30-59 ml/min   . Anemia   . History of blood transfusion     last one May 12   . Neuropathy    Health Maintenance  Topic  Date Due  . COLONOSCOPY  09/23/1991  . ZOSTAVAX  09/22/2001  . DEXA SCAN  09/23/2006  . INFLUENZA VACCINE  08/16/2014  . MAMMOGRAM  03/24/2015  . TETANUS/TDAP  10/15/2019  . PNA vac Low Risk Adult  Completed   Immunization History  Administered Date(s) Administered  . Influenza, High Dose Seasonal PF 09/30/2013  . Influenza-Unspecified 11/05/2012  . Pneumococcal Conjugate-13 10/05/2013  . Pneumococcal-Unspecified 03/16/2011  . Tdap 10/14/2009   Past Surgical History  Procedure Laterality Date  . History of port removal    . Status post stem cell transplant on September 28, 2008.    Marland Kitchen Abdominal hysterectomy  1981  . Cholecystectomy  1971  . Mastectomy Left 2008  . Cataract extraction, bilateral    . Breast surgery    . Eye surgery Bilateral     lens implant  . Breast reconstruction    . Portacath placement  12/2012    has had 2  . Av fistula placement Left 06/19/2013    Procedure: CREATION OF LEFT ARM ARTERIOVENOUS (AV) FISTULA ;  Surgeon: Angelia Mould, MD;  Location: [facility] OR;  Service: Vascular;  Laterality: Left;   Family History  Problem Relation Age of Onset  . Arthritis Mother   . Asthma Mother   . Cancer Sister   . Hyperlipidemia Brother    History  Substance Use Topics  . Smoking status: Former Smoker -- 1.00 packs/day for 30 years    Types: Cigarettes    Quit date: 02/15/2005  . Smokeless tobacco: Never Used  . Alcohol Use: No    ROS Constitutional: Denies fever, chills, weight loss/gain, headaches, insomnia, fatigue, night sweats, and change in appetite. Eyes: Denies redness, blurred vision, diplopia, discharge, itchy, watery eyes.  ENT: Denies discharge, congestion, post nasal drip, epistaxis, sore throat, earache, hearing loss, dental pain, Tinnitus, Vertigo, Sinus pain, snoring.  Cardio: Denies chest pain, palpitations, irregular heartbeat, syncope, dyspnea, diaphoresis, orthopnea, PND, claudication, edema Respiratory: denies cough, dyspnea, DOE,  pleurisy, hoarseness, laryngitis, wheezing.  Gastrointestinal: Denies dysphagia, heartburn, reflux, water brash, pain, cramps, nausea, vomiting, bloating, diarrhea, constipation, hematemesis, melena, hematochezia, jaundice, hemorrhoids Genitourinary: Denies dysuria, frequency, urgency, nocturia, hesitancy, discharge, hematuria, flank pain Breast: Breast lumps, nipple discharge, bleeding.  Musculoskeletal: Denies arthralgia, myalgia, stiffness, Jt. Swelling, pain, limp, and strain/sprain. Denies falls. Skin: Denies puritis, rash, hives, warts, acne, eczema, changing in skin lesion Neuro: No weakness, tremor, incoordination, spasms, paresthesia, pain Psychiatric: Denies confusion, memory loss, sensory loss. Denies Depression. Endocrine: Denies change in weight, skin, hair change, nocturia, and paresthesia, diabetic polys, visual blurring, hyper / hypo glycemic episodes.  Heme/Lymph: No excessive bleeding, bruising, enlarged lymph nodes.  Physical Exam  BP 118/58   Pulse 72  Temp 97.7 F   Resp 16  Ht $R'5\' 5"'Gd$   Wt 166 lb 9.6 oz     BMI 27.72   General Appearance: Well nourished and in no apparent distress. Eyes: PERRLA, EOMs, conjunctiva no swelling or erythema, normal fundi and vessels. Sinuses: No frontal/maxillary tenderness ENT/Mouth: EACs patent / TMs  nl. Nares clear without erythema, swelling, mucoid exudates. Oral hygiene is good. No erythema, swelling, or exudate. Tongue normal, non-obstructing. Tonsils not swollen or erythematous. Hearing normal.  Neck: Supple, thyroid normal. No bruits, nodes or JVD. Respiratory: Respiratory effort normal.  BS equal and clear bilateral without rales, rhonci, wheezing or stridor. Cardio: Heart sounds are normal with regular rate and rhythm and no murmurs, rubs or gallops. Peripheral pulses are normal and equal bilaterally without edema. No aortic or femoral bruits. Chest: symmetric with normal excursions and percussion. Breasts: Symmetric, without  lumps, nipple discharge, retractions, or fibrocystic changes. Noted Left TRAM reconstruction Abdomen: Flat, soft, with bowl sounds. Nontender, no guarding, rebound, hernias, masses, or organomegaly.  Lymphatics: Non tender without lymphadenopathy.  Musculoskeletal: Full ROM all peripheral extremities, joint stability, 5/5 strength, and normal gait. Skin: Warm and dry without rashes, lesions, cyanosis, clubbing or  ecchymosis.  Neuro: Cranial nerves intact, reflexes equal bilaterally. Normal muscle  tone, no cerebellar symptoms. Sensation intact.  Pysch: Awake and oriented X 3, normal affect, Insight and Judgment appropriate.   Assessment and Plan  1. Essential hypertension  - Microalbumin / creatinine urine ratio - EKG 12-Lead - [place], RETROPERITNL ABD,  LTD  2. Mixed hyperlipidemia  - Lipid panel  3. Prediabetes  - Hemoglobin A1c - Insulin, random  4. Vitamin D deficiency  - Vit D  25 hydroxy  5. Hypothyroidism  - TSH  6. Screening for rectal cancer  - POC Hemoccult Bld/Stl   7. Depression screen  - Screen Negative  8. Medication management  - Urine Microscopic - Magnesium  9. At low risk for fall   10. Depression, controlled   Continue prudent diet as discussed, weight control, BP monitoring, regular exercise, and medications. Discussed med's effects and SE's. Screening labs and tests as requested with regular follow-up as recommended. Over 40 minutes of exam, counseling, chart review was performed.

## 2014-04-21 ENCOUNTER — Other Ambulatory Visit (HOSPITAL_BASED_OUTPATIENT_CLINIC_OR_DEPARTMENT_OTHER): Payer: Medicare Other

## 2014-04-21 DIAGNOSIS — E2839 Other primary ovarian failure: Secondary | ICD-10-CM | POA: Diagnosis not present

## 2014-04-21 DIAGNOSIS — C9000 Multiple myeloma not having achieved remission: Secondary | ICD-10-CM

## 2014-04-21 DIAGNOSIS — Z853 Personal history of malignant neoplasm of breast: Secondary | ICD-10-CM | POA: Diagnosis not present

## 2014-04-21 LAB — LACTATE DEHYDROGENASE (CC13): LDH: 149 U/L (ref 125–245)

## 2014-04-21 LAB — URINALYSIS, MICROSCOPIC ONLY
Bacteria, UA: NONE SEEN
CASTS: NONE SEEN
Crystals: NONE SEEN
Squamous Epithelial / LPF: NONE SEEN

## 2014-04-21 LAB — CBC WITH DIFFERENTIAL/PLATELET
BASO%: 0.7 % (ref 0.0–2.0)
BASOS ABS: 0.1 10*3/uL (ref 0.0–0.1)
EOS ABS: 0.2 10*3/uL (ref 0.0–0.5)
EOS%: 2.6 % (ref 0.0–7.0)
HCT: 29 % — ABNORMAL LOW (ref 34.8–46.6)
HEMOGLOBIN: 9.5 g/dL — AB (ref 11.6–15.9)
LYMPH%: 17 % (ref 14.0–49.7)
MCH: 32.6 pg (ref 25.1–34.0)
MCHC: 32.8 g/dL (ref 31.5–36.0)
MCV: 99.5 fL (ref 79.5–101.0)
MONO#: 0.8 10*3/uL (ref 0.1–0.9)
MONO%: 10.3 % (ref 0.0–14.0)
NEUT%: 69.4 % (ref 38.4–76.8)
NEUTROS ABS: 5.5 10*3/uL (ref 1.5–6.5)
Platelets: 236 10*3/uL (ref 145–400)
RBC: 2.91 10*6/uL — AB (ref 3.70–5.45)
RDW: 14.3 % (ref 11.2–14.5)
WBC: 8 10*3/uL (ref 3.9–10.3)
lymph#: 1.4 10*3/uL (ref 0.9–3.3)

## 2014-04-21 LAB — MICROALBUMIN / CREATININE URINE RATIO
CREATININE, URINE: 73.4 mg/dL
Microalb Creat Ratio: 4.1 mg/g (ref 0.0–30.0)
Microalb, Ur: 0.3 mg/dL (ref <2.0)

## 2014-04-21 LAB — VITAMIN D 25 HYDROXY (VIT D DEFICIENCY, FRACTURES): VIT D 25 HYDROXY: 86 ng/mL (ref 30–100)

## 2014-04-21 LAB — COMPREHENSIVE METABOLIC PANEL (CC13)
ALBUMIN: 4 g/dL (ref 3.5–5.0)
ALK PHOS: 93 U/L (ref 40–150)
ALT: 17 U/L (ref 0–55)
ANION GAP: 12 meq/L — AB (ref 3–11)
AST: 18 U/L (ref 5–34)
BUN: 38.1 mg/dL — AB (ref 7.0–26.0)
CO2: 21 mEq/L — ABNORMAL LOW (ref 22–29)
Calcium: 9.3 mg/dL (ref 8.4–10.4)
Chloride: 110 mEq/L — ABNORMAL HIGH (ref 98–109)
Creatinine: 3.9 mg/dL (ref 0.6–1.1)
EGFR: 11 mL/min/{1.73_m2} — ABNORMAL LOW (ref >90)
GLUCOSE: 100 mg/dL (ref 70–140)
POTASSIUM: 3.6 meq/L (ref 3.5–5.1)
SODIUM: 143 meq/L (ref 136–145)
TOTAL PROTEIN: 6.6 g/dL (ref 6.4–8.3)
Total Bilirubin: 0.77 mg/dL (ref 0.20–1.20)

## 2014-04-21 LAB — INSULIN, RANDOM: INSULIN: 9.3 u[IU]/mL (ref 2.0–19.6)

## 2014-04-23 LAB — BETA 2 MICROGLOBULIN, SERUM: BETA 2 MICROGLOBULIN: 9.07 mg/L — AB (ref <=2.51)

## 2014-04-23 LAB — KAPPA/LAMBDA LIGHT CHAINS
Kappa free light chain: 4.3 mg/dL — ABNORMAL HIGH (ref 0.33–1.94)
Kappa:Lambda Ratio: 4.06 — ABNORMAL HIGH (ref 0.26–1.65)
Lambda Free Lght Chn: 1.06 mg/dL (ref 0.57–2.63)

## 2014-04-23 LAB — IGG, IGA, IGM
IGG (IMMUNOGLOBIN G), SERUM: 616 mg/dL — AB (ref 690–1700)
IgA: 21 mg/dL — ABNORMAL LOW (ref 69–380)
IgM, Serum: 23 mg/dL — ABNORMAL LOW (ref 52–322)

## 2014-04-28 ENCOUNTER — Encounter: Payer: Self-pay | Admitting: Physician Assistant

## 2014-04-28 ENCOUNTER — Ambulatory Visit (HOSPITAL_BASED_OUTPATIENT_CLINIC_OR_DEPARTMENT_OTHER): Payer: Medicare Other | Admitting: Physician Assistant

## 2014-04-28 VITALS — BP 126/57 | HR 70 | Temp 98.3°F | Resp 18 | Ht 65.0 in | Wt 167.3 lb

## 2014-04-28 DIAGNOSIS — C9002 Multiple myeloma in relapse: Secondary | ICD-10-CM | POA: Diagnosis not present

## 2014-04-28 DIAGNOSIS — C9000 Multiple myeloma not having achieved remission: Secondary | ICD-10-CM

## 2014-04-28 NOTE — Progress Notes (Addendum)
[facility]  [phone] [phone]  OFFICE PROGRESS NOTE   PRINCIPAL DIAGNOSES:  1. Recurrent multiple myeloma initially diagnosed in 2002, initially with smoldering myeloma at [facility].  2. Ductal carcinoma in situ status post mastectomy with sentinel lymph node biopsy in October 2008.  PRIOR THERAPY:  1. Status post treatment with tamoxifen from November 2008 through February 2009, discontinued secondary to intolerance. 2. Status post 3 cycles of chemotherapy with Revlimid and Decadron followed by 1 cycle of Decadron only with mild response. 3. Status post 2 cycles of systemic chemotherapy with Velcade, Doxil and Decadron discontinued secondary to significant peripheral neuropathy. Last dose was given May 2010 at [facility]. 4. Status post autologous peripheral blood stem cell transplant on October 01, 2008 at [facility] under the care of Dr. Phyllis Ginger.  5. Systemic chemotherapy with Carfilzomib initially was 20 mg/M2 and will be increased after cycle #1 to 36 mg/M2 on days 1, 2, 8, 9, 15 and 16 every 4 weeks in addition to Cytoxan 300 mg/M2 and Decadron 40 mg by mouth weekly basis, status post 4 cycles. First cycle on 12/29/2012.    CURRENT THERAPY: Observation.   INTERVAL HISTORY: Nancy Norris 74 y.o. female returns to the clinic today for followup visit. She is feeling fine today with no specific complaints except for decreased appetite. She was seen recently by her nephrologist, Dr. Arty Baumgartner, who encouraged her to resume treatment for her multiple myeloma as it was adversely effecting her kidneys.  She denied having any significant nausea or vomiting, no fever or chills. The patient denied having any chest pain, shortness of breath, cough or hemoptysis. No significant weight loss or night sweats. She would like to resume treatment as soon as possible.  MEDICAL HISTORY: Past Medical History  Diagnosis Date    . Hyperkalemia   . Hypothyroidism   . COPD (chronic obstructive pulmonary disease)   . Hyponatremia   . Dizziness   . Fibromyalgia   . Breast cancer   . Multiple myeloma   . Mucositis   . Hx of echocardiogram     a.  Echocardiogram (12/26/2012): EF 44-81%, grade 1 diastolic dysfunction;   b.  Echocardiogram (02/2013): EF 55-60%, no WMA, trivial effusion  . Fibromyalgia   . Arthritis   . Hx of cardiovascular stress test     LexiScan with low level exercise Myoview (02/2013): No ischemia, EF 72%; normal study  . Complication of anesthesia   . PONV (postoperative nausea and vomiting) 2008    after mastestomy  . Myocardial infarction     in past, patient was unaware.   . Anginal pain     used NTG x 2 May 31 and 06/15/13   . GERD (gastroesophageal reflux disease)   . Headache(784.0)   . Anxiety   . Depression   . CKD (chronic kidney disease) stage 3, GFR 30-59 ml/min   . Anemia   . History of blood transfusion     last one May 12   . Neuropathy     ALLERGIES:  is allergic to codeine; latex; other; onion; adhesive; iodinated diagnostic agents; and sulfa antibiotics.  MEDICATIONS:  Current Outpatient Prescriptions  Medication Sig Dispense Refill  . acetaminophen (TYLENOL) 325 MG tablet Take 650 mg by mouth every 6 (six) hours as needed for mild pain or moderate pain.     Marland Kitchen albuterol (PROVENTIL HFA;VENTOLIN HFA) 108 (90 BASE) MCG/ACT inhaler Inhale 2 puffs into the lungs 2 (two)  times daily as needed for wheezing or shortness of breath. 1 each 6  . ascorbic acid (VITAMIN C) 250 MG CHEW Chew 250 mg by mouth daily.    Marland Kitchen aspirin EC 81 MG tablet Take 1 tablet (81 mg total) by mouth daily.    Marland Kitchen buPROPion (WELLBUTRIN XL) 300 MG 24 hr tablet Take 300 mg by mouth daily.   98  . carboxymethylcellulose (REFRESH PLUS) 0.5 % SOLN Place 1 drop into both eyes every 2 (two) hours.    . Cholecalciferol 4000 UNITS TABS Take 4,000 Units by mouth daily.    . citalopram (CELEXA) 40 MG tablet TAKE 1  TABLET BY MOUTH ONCE DAILY. 90 tablet 0  . cromolyn (OPTICROM) 4 % ophthalmic solution Place 1 drop into both eyes 4 (four) times daily.    Marland Kitchen esomeprazole (NEXIUM) 40 MG capsule TAKE 1 CAPSULE BY MOUTH DAILY, EACH MORNING FOR ACID INDIGESTION 30 capsule PRN  . Ferrous Sulfate 134 MG TABS Take 1 tablet by mouth every other day.     . fludrocortisone (FLORINEF) 0.1 MG tablet TAKE 1 TO 2 TABLETS DAILY AS DIRECTED FOR LOW BLOOD PRESSURE. 60 tablet 1  . fluticasone (FLONASE) 50 MCG/ACT nasal spray Place 2 sprays into both nostrils at bedtime.     . gabapentin (NEURONTIN) 300 MG capsule Take 1 capsule (300 mg total) by mouth at bedtime. 90 capsule 3  . isosorbide mononitrate (IMDUR) 30 MG 24 hr tablet TAKE 1 TABLET BY MOUTH DAILY. 30 tablet 11  . levothyroxine (SYNTHROID, LEVOTHROID) 150 MCG tablet TAKE 1 TABLET BY MOUTH ONCE DAILY. (Patient taking differently: TAKE 1 TABLET BY MOUTH ONCE DAILY., 4 times a week and 1 and 1/2 Th, Sat, Sun) 90 tablet 0  . loratadine (CLARITIN) 10 MG tablet Take 10 mg by mouth daily.    Marland Kitchen loteprednol (ALREX) 0.2 % SUSP Place 1 drop into both eyes 4 (four) times daily. 1 Bottle PRN  . meclizine (ANTIVERT) 32 MG tablet Take 1 tablet (32 mg total) by mouth 3 (three) times daily as needed. 30 tablet 0  . midodrine (PROAMATINE) 10 MG tablet Take 1 tablet (10 mg total) by mouth 3 (three) times daily. 90 tablet 11  . nystatin (MYCOSTATIN/NYSTOP) 100000 UNIT/GM POWD Apply 1 g topically 2 (two) times daily. Apply to belly    . ondansetron (ZOFRAN-ODT) 8 MG disintegrating tablet Take 1 tablet (8 mg total) by mouth every 8 (eight) hours as needed for nausea or vomiting. 20 tablet 3  . polyethylene glycol (MIRALAX / GLYCOLAX) packet Take 17 g by mouth daily.    . ranitidine (ZANTAC) 300 MG tablet TAKE 1 TABLET BY MOUTH AT BEDTIME, FOR ACID REFLUX AND INDIGESTION. 30 tablet PRN  . RESTASIS 0.05 % ophthalmic emulsion Take 1 drop by mouth 2 (two) times daily.  4  . zolpidem (AMBIEN) 5 MG  tablet TAKE 1 TABLET BY MOUTH AT BEDTIME AS NEEDED FOR SLEEP. 30 tablet 0  . nitroGLYCERIN (NITROSTAT) 0.4 MG SL tablet Place 1 tablet (0.4 mg total) under the tongue every 5 (five) minutes as needed for chest pain. (Patient not taking: Reported on 04/06/2014) 25 tablet 3  . prochlorperazine (COMPAZINE) 10 MG tablet TAKE 1 TABLET BY MOUTH EVERY 6 HOURS AS NEEDED FOR NAUSEA OR VOMITING. (Patient not taking: Reported on 04/06/2014) 60 tablet 0   No current facility-administered medications for this visit.   Facility-Administered Medications Ordered in Other Visits  Medication Dose Route Frequency Provider Last Rate Last Dose  . 0.9 %  sodium chloride infusion   Intravenous Once Adair Lauderback E Jaqualyn Juday, PA-C      . sodium chloride 0.9 % injection 10 mL  10 mL Intracatheter PRN Curt Bears, MD   10 mL at 01/05/13 1825    SURGICAL HISTORY:  Past Surgical History  Procedure Laterality Date  . History of port removal    . Status post stem cell transplant on September 28, 2008.    Marland Kitchen Abdominal hysterectomy  1981  . Cholecystectomy  1971  . Mastectomy Left 2008  . Cataract extraction, bilateral    . Breast surgery    . Eye surgery Bilateral     lens implant  . Breast reconstruction    . Portacath placement  12/2012    has had 2  . Av fistula placement Left 06/19/2013    Procedure: CREATION OF LEFT ARM ARTERIOVENOUS (AV) FISTULA ;  Surgeon: Angelia Mould, MD;  Location: [facility] OR;  Service: Vascular;  Laterality: Left;    REVIEW OF SYSTEMS:  A comprehensive review of systems was negative except for: Constitutional: positive for anorexia   PHYSICAL EXAMINATION: General appearance: alert, cooperative and no distress Head: Normocephalic, without obvious abnormality, atraumatic Neck: no adenopathy Lymph nodes: Cervical, supraclavicular, and axillary nodes normal. Resp: clear to auscultation bilaterally Back: symmetric, no curvature. ROM normal. No CVA tenderness. Cardio: regular rate and rhythm,  S1, S2 normal, no murmur, click, rub or gallop GI: soft, non-tender; bowel sounds normal; no masses,  no organomegaly Extremities: extremities normal, atraumatic, no cyanosis or edema Neurologic: Alert and oriented X 3, normal strength and tone. Normal symmetric reflexes. Normal coordination and gait  ECOG PERFORMANCE STATUS: 1 - Symptomatic but completely ambulatory  Blood pressure 126/57, pulse 70, temperature 98.3 F (36.8 C), temperature source Oral, resp. rate 18, height $RemoveBe'5\' 5"'zhcSFuuAZ$  (1.651 m), weight 167 lb 4.8 oz (75.887 kg), SpO2 100 %.  LABORATORY DATA: Lab Results  Component Value Date   WBC 8.0 04/21/2014   HGB 9.5* 04/21/2014   HCT 29.0* 04/21/2014   MCV 99.5 04/21/2014   PLT 236 04/21/2014      Chemistry      Component Value Date/Time   NA 143 04/21/2014 0930   NA 138 04/03/2014 0306   K 3.6 04/21/2014 0930   K 3.3* 04/03/2014 0306   CL 110 04/03/2014 0306   CL 105 03/19/2012 0811   CO2 21* 04/21/2014 0930   CO2 22 04/03/2014 0306   BUN 38.1* 04/21/2014 0930   BUN 33* 04/03/2014 0306   CREATININE 3.9* 04/21/2014 0930   CREATININE 4.29* 04/03/2014 0306   CREATININE 3.87* 03/23/2014 1558      Component Value Date/Time   CALCIUM 9.3 04/21/2014 0930   CALCIUM 8.3* 04/03/2014 0306   ALKPHOS 93 04/21/2014 0930   ALKPHOS 71 04/03/2014 0306   AST 18 04/21/2014 0930   AST 20 04/03/2014 0306   ALT 17 04/21/2014 0930   ALT 26 04/03/2014 0306   BILITOT 0.77 04/21/2014 0930   BILITOT 0.7 04/03/2014 0306     Other lab results: Beta-2 microglobulin 10.80, free kappa light chain 5.67, free lambda light chain 1.05, kappa/lambda ratio 5.40. IgG 688 gA 22 and IgM 31   RADIOGRAPHIC STUDIES:  ASSESSMENT AND PLAN: This is a very pleasant 74 years old white female with relapsed multiple myeloma. She is currently undergoing systemic chemotherapy with Carfilzomib, Cytoxan and dexamethasone status post 4 cycles. She has been on observation but now after consulting with her  nephrologist, wants to resume treatment for  her multiple myeloma. She requests a refill for her Zofran ODT.The patient was discussed with and also seen by Dr. Julien Nordmann. We will arrange for her to resume systemic chemotherapy with carfilzomib, cytoxan and dexamethasone next week.  A prescription for dexamethasone 40 mg by mouth once weekly and Zofran 8 mg ODT were sent to her pharmacy of record via [employer] She will follow up  In 3 weeks prior to the start oc cycle #2.  She was advised to call immediately if she has any concerning symptoms in the interval.  Disclaimer: This note was dictated with voice recognition software. Similar sounding words can inadvertently be transcribed and may not be corrected upon review.  Carlton Adam, PA-C 04/28/2014  ADDENDUM: Hematology/Oncology Attending: I had a face to face encounter with the patient. I recommended her care plan. This is a very pleasant 74 years old white female with recurrent multiple myeloma who is off treatment for the last few months based on her request. The patient was seen recently by her nephrologist and he advised her to resume her treatment because of the decline in her renal function. The patient is here today for reevaluation before resuming her systemic chemotherapy with Carfilzomib, Cytoxan and dexamethasone. She is feeling fine and denied having any specific complaints except for mild fatigue. Will start the treatment today as a scheduled. The patient was given prescription for Zofran as needed for nausea. She will come back for follow-up visit in 3 weeks for reevaluation before starting the next cycle of her chemotherapy. The patient was advised to call immediately if she has any concerning symptoms in the interval.  Disclaimer: This note was dictated with voice recognition software. Similar sounding words can inadvertently be transcribed and may not be corrected upon review. [name]., MD 05/02/2014

## 2014-04-29 ENCOUNTER — Telehealth: Payer: Self-pay | Admitting: Internal Medicine

## 2014-04-29 ENCOUNTER — Telehealth: Payer: Self-pay | Admitting: *Deleted

## 2014-04-29 NOTE — Telephone Encounter (Signed)
Pt aware of lab results of bone density scan.

## 2014-04-29 NOTE — Telephone Encounter (Signed)
s.w. pt and advised on April appts...she will get sched at visit....pt ok and aware

## 2014-05-01 MED ORDER — ONDANSETRON 8 MG PO TBDP: 8.0000 mg | ORAL_TABLET | Freq: Three times a day (TID) | ORAL | Status: DC | PRN

## 2014-05-01 MED ORDER — DEXAMETHASONE 4 MG PO TABS: ORAL_TABLET | ORAL | Status: DC

## 2014-05-01 NOTE — Patient Instructions (Signed)
You will resume chemotherapy for your multiple myeloma as scheduled Follow up in 3 weeks

## 2014-05-03 ENCOUNTER — Other Ambulatory Visit (HOSPITAL_BASED_OUTPATIENT_CLINIC_OR_DEPARTMENT_OTHER): Payer: Medicare Other

## 2014-05-03 ENCOUNTER — Ambulatory Visit (HOSPITAL_BASED_OUTPATIENT_CLINIC_OR_DEPARTMENT_OTHER): Payer: Medicare Other

## 2014-05-03 VITALS — BP 104/55 | HR 73 | Temp 97.9°F | Resp 18

## 2014-05-03 DIAGNOSIS — C9000 Multiple myeloma not having achieved remission: Secondary | ICD-10-CM

## 2014-05-03 DIAGNOSIS — C9002 Multiple myeloma in relapse: Secondary | ICD-10-CM

## 2014-05-03 DIAGNOSIS — Z5112 Encounter for antineoplastic immunotherapy: Secondary | ICD-10-CM | POA: Diagnosis not present

## 2014-05-03 DIAGNOSIS — Z853 Personal history of malignant neoplasm of breast: Secondary | ICD-10-CM

## 2014-05-03 DIAGNOSIS — Z5111 Encounter for antineoplastic chemotherapy: Secondary | ICD-10-CM

## 2014-05-03 DIAGNOSIS — C9001 Multiple myeloma in remission: Secondary | ICD-10-CM

## 2014-05-03 LAB — COMPREHENSIVE METABOLIC PANEL (CC13)
ALBUMIN: 3.7 g/dL (ref 3.5–5.0)
ALK PHOS: 94 U/L (ref 40–150)
ALT: 14 U/L (ref 0–55)
AST: 13 U/L (ref 5–34)
Anion Gap: 13 mEq/L — ABNORMAL HIGH (ref 3–11)
BUN: 32.1 mg/dL — AB (ref 7.0–26.0)
CO2: 15 mEq/L — ABNORMAL LOW (ref 22–29)
Calcium: 8.8 mg/dL (ref 8.4–10.4)
Chloride: 112 mEq/L — ABNORMAL HIGH (ref 98–109)
Creatinine: 3.4 mg/dL (ref 0.6–1.1)
EGFR: 13 mL/min/{1.73_m2} — AB (ref >90)
GLUCOSE: 135 mg/dL (ref 70–140)
Potassium: 3.5 mEq/L (ref 3.5–5.1)
SODIUM: 140 meq/L (ref 136–145)
Total Bilirubin: 0.46 mg/dL (ref 0.20–1.20)
Total Protein: 6.4 g/dL (ref 6.4–8.3)

## 2014-05-03 LAB — CBC WITH DIFFERENTIAL/PLATELET
BASO%: 0.5 % (ref 0.0–2.0)
BASOS ABS: 0 10*3/uL (ref 0.0–0.1)
EOS ABS: 0.2 10*3/uL (ref 0.0–0.5)
EOS%: 3 % (ref 0.0–7.0)
HCT: 31.8 % — ABNORMAL LOW (ref 34.8–46.6)
HEMOGLOBIN: 10.4 g/dL — AB (ref 11.6–15.9)
LYMPH#: 1.1 10*3/uL (ref 0.9–3.3)
LYMPH%: 13.1 % — ABNORMAL LOW (ref 14.0–49.7)
MCH: 32.5 pg (ref 25.1–34.0)
MCHC: 32.7 g/dL (ref 31.5–36.0)
MCV: 99.4 fL (ref 79.5–101.0)
MONO#: 0.8 10*3/uL (ref 0.1–0.9)
MONO%: 9.4 % (ref 0.0–14.0)
NEUT%: 74 % (ref 38.4–76.8)
NEUTROS ABS: 6 10*3/uL (ref 1.5–6.5)
Platelets: 232 10*3/uL (ref 145–400)
RBC: 3.2 10*6/uL — ABNORMAL LOW (ref 3.70–5.45)
RDW: 14 % (ref 11.2–14.5)
WBC: 8.2 10*3/uL (ref 3.9–10.3)

## 2014-05-03 MED ORDER — DEXTROSE 5 % IV SOLN
37.0000 mg/m2 | Freq: Once | INTRAVENOUS | Status: AC
  Administered 2014-05-03: 70 mg via INTRAVENOUS
  Filled 2014-05-03: qty 35

## 2014-05-03 MED ORDER — SODIUM CHLORIDE 0.9 % IV SOLN
3.0000 mg | Freq: Once | INTRAVENOUS | Status: AC
  Administered 2014-05-03: 3 mg via INTRAVENOUS
  Filled 2014-05-03: qty 3.75

## 2014-05-03 MED ORDER — CYCLOPHOSPHAMIDE CHEMO INJECTION 1 GM
300.0000 mg/m2 | Freq: Once | INTRAMUSCULAR | Status: AC
  Administered 2014-05-03: 560 mg via INTRAVENOUS
  Filled 2014-05-03: qty 28

## 2014-05-03 MED ORDER — SODIUM CHLORIDE 0.9 % IV SOLN: Freq: Once | INTRAVENOUS | Status: DC

## 2014-05-03 MED ORDER — SODIUM CHLORIDE 0.9 % IV SOLN
Freq: Once | INTRAVENOUS | Status: AC
  Administered 2014-05-03: 10:00:00 via INTRAVENOUS
  Filled 2014-05-03: qty 4

## 2014-05-03 MED ORDER — SODIUM CHLORIDE 0.9 % IV SOLN
Freq: Once | INTRAVENOUS | Status: AC
  Administered 2014-05-03: 10:00:00 via INTRAVENOUS

## 2014-05-03 MED ORDER — HEPARIN SOD (PORK) LOCK FLUSH 100 UNIT/ML IV SOLN
500.0000 [IU] | Freq: Once | INTRAVENOUS | Status: AC | PRN
  Administered 2014-05-03: 500 [IU]
  Filled 2014-05-03: qty 5

## 2014-05-03 MED ORDER — SODIUM CHLORIDE 0.9 % IJ SOLN
10.0000 mL | INTRAMUSCULAR | Status: DC | PRN
  Administered 2014-05-03: 10 mL
  Filled 2014-05-03: qty 10

## 2014-05-03 NOTE — Patient Instructions (Signed)
[facility] Discharge Instructions for Patients Receiving Chemotherapy  Today you received the following chemotherapy agents Kyprolis/Cytoxan  To help prevent nausea and vomiting after your treatment, we encourage you to take your nausea medication as directed.    If you develop nausea and vomiting that is not controlled by your nausea medication, call the clinic.   BELOW ARE SYMPTOMS THAT SHOULD BE REPORTED IMMEDIATELY:  *FEVER GREATER THAN 100.5 F  *CHILLS WITH OR WITHOUT FEVER  NAUSEA AND VOMITING THAT IS NOT CONTROLLED WITH YOUR NAUSEA MEDICATION  *UNUSUAL SHORTNESS OF BREATH  *UNUSUAL BRUISING OR BLEEDING  TENDERNESS IN MOUTH AND THROAT WITH OR WITHOUT PRESENCE OF ULCERS  *URINARY PROBLEMS  *BOWEL PROBLEMS  UNUSUAL RASH Items with * indicate a potential emergency and should be followed up as soon as possible.  Feel free to call the clinic you have any questions or concerns. The clinic phone number is [phone].  Please show the [place] at check-in to the Emergency Department and triage nurse.  Zoledronic Acid injection (Hypercalcemia, Oncology) What is this medicine? ZOLEDRONIC ACID (ZOE le dron ik AS id) lowers the amount of calcium loss from bone. It is used to treat too much calcium in your blood from cancer. It is also used to prevent complications of cancer that has spread to the bone. This medicine may be used for other purposes; ask your health care provider or pharmacist if you have questions. COMMON BRAND NAME(S): Zometa What should I tell my health care provider before I take this medicine? They need to know if you have any of these conditions: -aspirin-sensitive asthma -cancer, especially if you are receiving medicines used to treat cancer -dental disease or wear dentures -infection -kidney disease -receiving corticosteroids like dexamethasone or prednisone -an unusual or allergic reaction to zoledronic acid, other medicines,  foods, dyes, or preservatives -pregnant or trying to get pregnant -breast-feeding How should I use this medicine? This medicine is for infusion into a vein. It is given by a health care professional in a hospital or clinic setting. Talk to your pediatrician regarding the use of this medicine in children. Special care may be needed. Overdosage: If you think you have taken too much of this medicine contact a poison control center or emergency room at once. NOTE: This medicine is only for you. Do not share this medicine with others. What if I miss a dose? It is important not to miss your dose. Call your doctor or health care professional if you are unable to keep an appointment. What may interact with this medicine? -certain antibiotics given by injection -NSAIDs, medicines for pain and inflammation, like ibuprofen or naproxen -some diuretics like bumetanide, furosemide -teriparatide -thalidomide This list may not describe all possible interactions. Give your health care provider a list of all the medicines, herbs, non-prescription drugs, or dietary supplements you use. Also tell them if you smoke, drink alcohol, or use illegal drugs. Some items may interact with your medicine. What should I watch for while using this medicine? Visit your doctor or health care professional for regular checkups. It may be some time before you see the benefit from this medicine. Do not stop taking your medicine unless your doctor tells you to. Your doctor may order blood tests or other tests to see how you are doing. Women should inform their doctor if they wish to become pregnant or think they might be pregnant. There is a potential for serious side effects to an unborn child. Talk to  your health care professional or pharmacist for more information. You should make sure that you get enough calcium and vitamin D while you are taking this medicine. Discuss the foods you eat and the vitamins you take with your health  care professional. Some people who take this medicine have severe bone, joint, and/or muscle pain. This medicine may also increase your risk for jaw problems or a broken thigh bone. Tell your doctor right away if you have severe pain in your jaw, bones, joints, or muscles. Tell your doctor if you have any pain that does not go away or that gets worse. Tell your dentist and dental surgeon that you are taking this medicine. You should not have major dental surgery while on this medicine. See your dentist to have a dental exam and fix any dental problems before starting this medicine. Take good care of your teeth while on this medicine. Make sure you see your dentist for regular follow-up appointments. What side effects may I notice from receiving this medicine? Side effects that you should report to your doctor or health care professional as soon as possible: -allergic reactions like skin rash, itching or hives, swelling of the face, lips, or tongue -anxiety, confusion, or depression -breathing problems -changes in vision -eye pain -feeling faint or lightheaded, falls -jaw pain, especially after dental work -mouth sores -muscle cramps, stiffness, or weakness -trouble passing urine or change in the amount of urine Side effects that usually do not require medical attention (report to your doctor or health care professional if they continue or are bothersome): -bone, joint, or muscle pain -constipation -diarrhea -fever -hair loss -irritation at site where injected -loss of appetite -nausea, vomiting -stomach upset -trouble sleeping -trouble swallowing -weak or tired This list may not describe all possible side effects. Call your doctor for medical advice about side effects. You may report side effects to FDA at 1-800-FDA-1088. Where should I keep my medicine? This drug is given in a hospital or clinic and will not be stored at home. NOTE: This sheet is a summary. It may not cover all  possible information. If you have questions about this medicine, talk to your doctor, pharmacist, or health care provider.  2015, [employer]vier/[employer] Standard. (2012-06-12 13:03:13)

## 2014-05-04 ENCOUNTER — Ambulatory Visit (HOSPITAL_BASED_OUTPATIENT_CLINIC_OR_DEPARTMENT_OTHER): Payer: Medicare Other

## 2014-05-04 ENCOUNTER — Telehealth: Payer: Self-pay | Admitting: Medical Oncology

## 2014-05-04 VITALS — BP 126/53 | HR 79 | Temp 98.6°F | Resp 18

## 2014-05-04 DIAGNOSIS — C9000 Multiple myeloma not having achieved remission: Secondary | ICD-10-CM | POA: Diagnosis not present

## 2014-05-04 DIAGNOSIS — Z5112 Encounter for antineoplastic immunotherapy: Secondary | ICD-10-CM

## 2014-05-04 MED ORDER — SODIUM CHLORIDE 0.9 % IJ SOLN
10.0000 mL | INTRAMUSCULAR | Status: DC | PRN
  Administered 2014-05-04: 10 mL
  Filled 2014-05-04: qty 10

## 2014-05-04 MED ORDER — SODIUM CHLORIDE 0.9 % IV SOLN
Freq: Once | INTRAVENOUS | Status: AC
  Administered 2014-05-04: 08:00:00 via INTRAVENOUS

## 2014-05-04 MED ORDER — HEPARIN SOD (PORK) LOCK FLUSH 100 UNIT/ML IV SOLN
500.0000 [IU] | Freq: Once | INTRAVENOUS | Status: AC | PRN
  Administered 2014-05-04: 500 [IU]
  Filled 2014-05-04: qty 5

## 2014-05-04 MED ORDER — DEXTROSE 5 % IV SOLN
37.0000 mg/m2 | Freq: Once | INTRAVENOUS | Status: AC
  Administered 2014-05-04: 70 mg via INTRAVENOUS
  Filled 2014-05-04: qty 35

## 2014-05-04 MED ORDER — SODIUM CHLORIDE 0.9 % IV SOLN
Freq: Once | INTRAVENOUS | Status: AC
  Administered 2014-05-04: 09:00:00 via INTRAVENOUS
  Filled 2014-05-04: qty 4

## 2014-05-04 NOTE — Telephone Encounter (Signed)
Called in ambien 

## 2014-05-04 NOTE — Patient Instructions (Signed)
Carfilzomib injection What is this medicine? CARFILZOMIB (kar FILZ oh mib) is a chemotherapy drug that works by slowing or stopping cancer cell growth. This medicine is used to treat multiple myeloma. This medicine may be used for other purposes; ask your health care provider or pharmacist if you have questions. COMMON BRAND NAME(S): KYPROLIS What should I tell my health care provider before I take this medicine? They need to know if you have any of these conditions: -heart disease -irregular heartbeat -liver disease -lung or breathing disease -an unusual or allergic reaction to carfilzomib, or other medicines, foods, dyes, or preservatives -pregnant or trying to get pregnant -breast-feeding How should I use this medicine? This medicine is for injection or infusion into a vein. It is given by a health care professional in a hospital or clinic setting. Talk to your pediatrician regarding the use of this medicine in children. Special care may be needed. Overdosage: If you think you've taken too much of this medicine contact a poison control center or emergency room at once. Overdosage: If you think you have taken too much of this medicine contact a poison control center or emergency room at once. NOTE: This medicine is only for you. Do not share this medicine with others. What if I miss a dose? It is important not to miss your dose. Call your doctor or health care professional if you are unable to keep an appointment. What may interact with this medicine? Interactions are not expected. Give your health care provider a list of all the medicines, herbs, non-prescription drugs, or dietary supplements you use. Also tell them if you smoke, drink alcohol, or use illegal drugs. Some items may interact with your medicine. This list may not describe all possible interactions. Give your health care provider a list of all the medicines, herbs, non-prescription drugs, or dietary supplements you use. Also  tell them if you smoke, drink alcohol, or use illegal drugs. Some items may interact with your medicine. What should I watch for while using this medicine? Your condition will be monitored carefully while you are receiving this medicine. Report any side effects. Continue your course of treatment even though you feel ill unless your doctor tells you to stop. Call your doctor or health care professional for advice if you get a fever, chills or sore throat, or other symptoms of a cold or flu. Do not treat yourself. Try to avoid being around people who are sick. Do not become pregnant while taking this medicine. Women should inform their doctor if they wish to become pregnant or think they might be pregnant. There is a potential for serious side effects to an unborn child. Talk to your health care professional or pharmacist for more information. Do not breast-feed an infant while taking this medicine. Check with your doctor or health care professional if you get an attack of severe diarrhea, nausea and vomiting, or if you sweat a lot. The loss of too much body fluid can make it dangerous for you to take this medicine. You may get dizzy. Do not drive, use machinery, or do anything that needs mental alertness until you know how this medicine affects you. Do not stand or sit up quickly, especially if you are an older patient. This reduces the risk of dizzy or fainting spells. What side effects may I notice from receiving this medicine? Side effects that you should report to your doctor or health care professional as soon as possible: -allergic reactions like skin rash, itching or hives,  swelling of the face, lips, or tongue -breathing problems -chest pain or palpitationschest tightness -cough -dark urine -dizziness -feeling faint or lightheaded -fever or chills -general ill feeling or flu-like symptoms -light-colored stools -palpitations -right upper belly pain -swelling of the legs or ankles -unusual  bleeding or bruising -unusually weak or tired -yellowing of the eyes or skin Side effects that usually do not require medical attention (Report these to your doctor or health care professional if they continue or are bothersome.): -diarrhea -headache -nausea, vomiting -tiredness This list may not describe all possible side effects. Call your doctor for medical advice about side effects. You may report side effects to FDA at 1-800-FDA-1088. Where should I keep my medicine? This drug is given in a hospital or clinic and will not be stored at home. NOTE: This sheet is a summary. It may not cover all possible information. If you have questions about this medicine, talk to your doctor, pharmacist, or health care provider.  2015, [employer] Standard. (2011-06-22 17:02:29)

## 2014-05-05 ENCOUNTER — Other Ambulatory Visit: Payer: Self-pay | Admitting: Internal Medicine

## 2014-05-05 ENCOUNTER — Encounter: Payer: Self-pay | Admitting: Internal Medicine

## 2014-05-10 ENCOUNTER — Other Ambulatory Visit (HOSPITAL_BASED_OUTPATIENT_CLINIC_OR_DEPARTMENT_OTHER): Payer: Medicare Other

## 2014-05-10 ENCOUNTER — Ambulatory Visit (HOSPITAL_BASED_OUTPATIENT_CLINIC_OR_DEPARTMENT_OTHER): Payer: Medicare Other

## 2014-05-10 VITALS — BP 121/58 | HR 73 | Temp 98.3°F | Resp 18

## 2014-05-10 DIAGNOSIS — C9000 Multiple myeloma not having achieved remission: Secondary | ICD-10-CM

## 2014-05-10 DIAGNOSIS — C9002 Multiple myeloma in relapse: Secondary | ICD-10-CM | POA: Diagnosis not present

## 2014-05-10 DIAGNOSIS — Z5111 Encounter for antineoplastic chemotherapy: Secondary | ICD-10-CM

## 2014-05-10 DIAGNOSIS — Z5112 Encounter for antineoplastic immunotherapy: Secondary | ICD-10-CM

## 2014-05-10 LAB — CBC WITH DIFFERENTIAL/PLATELET
BASO%: 0.4 % (ref 0.0–2.0)
BASOS ABS: 0 10*3/uL (ref 0.0–0.1)
EOS%: 8 % — ABNORMAL HIGH (ref 0.0–7.0)
Eosinophils Absolute: 0.5 10*3/uL (ref 0.0–0.5)
HEMATOCRIT: 30 % — AB (ref 34.8–46.6)
HEMOGLOBIN: 9.8 g/dL — AB (ref 11.6–15.9)
LYMPH%: 21.3 % (ref 14.0–49.7)
MCH: 32.2 pg (ref 25.1–34.0)
MCHC: 32.7 g/dL (ref 31.5–36.0)
MCV: 98.7 fL (ref 79.5–101.0)
MONO#: 0.5 10*3/uL (ref 0.1–0.9)
MONO%: 8 % (ref 0.0–14.0)
NEUT%: 62.3 % (ref 38.4–76.8)
NEUTROS ABS: 4.2 10*3/uL (ref 1.5–6.5)
Platelets: 142 10*3/uL — ABNORMAL LOW (ref 145–400)
RBC: 3.04 10*6/uL — ABNORMAL LOW (ref 3.70–5.45)
RDW: 13.6 % (ref 11.2–14.5)
WBC: 6.8 10*3/uL (ref 3.9–10.3)
lymph#: 1.4 10*3/uL (ref 0.9–3.3)

## 2014-05-10 LAB — COMPREHENSIVE METABOLIC PANEL (CC13)
ALT: 35 U/L (ref 0–55)
AST: 21 U/L (ref 5–34)
Albumin: 3.5 g/dL (ref 3.5–5.0)
Alkaline Phosphatase: 106 U/L (ref 40–150)
Anion Gap: 14 mEq/L — ABNORMAL HIGH (ref 3–11)
BUN: 32.8 mg/dL — AB (ref 7.0–26.0)
CALCIUM: 10.9 mg/dL — AB (ref 8.4–10.4)
CO2: 16 mEq/L — ABNORMAL LOW (ref 22–29)
Chloride: 109 mEq/L (ref 98–109)
Creatinine: 4.3 mg/dL (ref 0.6–1.1)
EGFR: 10 mL/min/{1.73_m2} — ABNORMAL LOW (ref >90)
GLUCOSE: 124 mg/dL (ref 70–140)
Potassium: 3.7 mEq/L (ref 3.5–5.1)
Sodium: 139 mEq/L (ref 136–145)
TOTAL PROTEIN: 6.1 g/dL — AB (ref 6.4–8.3)
Total Bilirubin: 0.3 mg/dL (ref 0.20–1.20)

## 2014-05-10 MED ORDER — DEXTROSE 5 % IV SOLN
37.0000 mg/m2 | Freq: Once | INTRAVENOUS | Status: AC
  Administered 2014-05-10: 70 mg via INTRAVENOUS
  Filled 2014-05-10: qty 35

## 2014-05-10 MED ORDER — SODIUM CHLORIDE 0.9 % IV SOLN
Freq: Once | INTRAVENOUS | Status: AC
  Administered 2014-05-10: 11:00:00 via INTRAVENOUS

## 2014-05-10 MED ORDER — HEPARIN SOD (PORK) LOCK FLUSH 100 UNIT/ML IV SOLN
500.0000 [IU] | Freq: Once | INTRAVENOUS | Status: AC | PRN
  Administered 2014-05-10: 500 [IU]
  Filled 2014-05-10: qty 5

## 2014-05-10 MED ORDER — SODIUM CHLORIDE 0.9 % IJ SOLN
10.0000 mL | INTRAMUSCULAR | Status: DC | PRN
  Administered 2014-05-10: 10 mL
  Filled 2014-05-10: qty 10

## 2014-05-10 MED ORDER — SODIUM CHLORIDE 0.9 % IV SOLN
300.0000 mg/m2 | Freq: Once | INTRAVENOUS | Status: AC
  Administered 2014-05-10: 560 mg via INTRAVENOUS
  Filled 2014-05-10: qty 28

## 2014-05-10 MED ORDER — SODIUM CHLORIDE 0.9 % IV SOLN
Freq: Once | INTRAVENOUS | Status: AC
  Administered 2014-05-10: 11:00:00 via INTRAVENOUS
  Filled 2014-05-10: qty 4

## 2014-05-10 NOTE — Progress Notes (Signed)
Dr. Julien Nordmann notified of Cmet and CBC, creatinine at 4.3 today. OK to treat per Dr. Julien Nordmann.

## 2014-05-10 NOTE — Patient Instructions (Signed)
[facility] Discharge Instructions for Patients Receiving Chemotherapy  Today you received the following chemotherapy agents Kyprolis/Cytoxan  To help prevent nausea and vomiting after your treatment, we encourage you to take your nausea medication as directed.    If you develop nausea and vomiting that is not controlled by your nausea medication, call the clinic.   BELOW ARE SYMPTOMS THAT SHOULD BE REPORTED IMMEDIATELY:  *FEVER GREATER THAN 100.5 F  *CHILLS WITH OR WITHOUT FEVER  NAUSEA AND VOMITING THAT IS NOT CONTROLLED WITH YOUR NAUSEA MEDICATION  *UNUSUAL SHORTNESS OF BREATH  *UNUSUAL BRUISING OR BLEEDING  TENDERNESS IN MOUTH AND THROAT WITH OR WITHOUT PRESENCE OF ULCERS  *URINARY PROBLEMS  *BOWEL PROBLEMS  UNUSUAL RASH Items with * indicate a potential emergency and should be followed up as soon as possible.  Feel free to call the clinic you have any questions or concerns. The clinic phone number is [phone].  Please show the CHEMO ALERT CARD at check-in to the Emergency Department and triage nurse.  Zoledronic Acid injection (Hypercalcemia, Oncology) What is this medicine? ZOLEDRONIC ACID (ZOE le dron ik AS id) lowers the amount of calcium loss from bone. It is used to treat too much calcium in your blood from cancer. It is also used to prevent complications of cancer that has spread to the bone. This medicine may be used for other purposes; ask your health care provider or pharmacist if you have questions. COMMON BRAND NAME(S): Zometa What should I tell my health care provider before I take this medicine? They need to know if you have any of these conditions: -aspirin-sensitive asthma -cancer, especially if you are receiving medicines used to treat cancer -dental disease or wear dentures -infection -kidney disease -receiving corticosteroids like dexamethasone or prednisone -an unusual or allergic reaction to zoledronic acid, other medicines,  foods, dyes, or preservatives -pregnant or trying to get pregnant -breast-feeding How should I use this medicine? This medicine is for infusion into a vein. It is given by a health care professional in a hospital or clinic setting. Talk to your pediatrician regarding the use of this medicine in children. Special care may be needed. Overdosage: If you think you have taken too much of this medicine contact a poison control center or emergency room at once. NOTE: This medicine is only for you. Do not share this medicine with others. What if I miss a dose? It is important not to miss your dose. Call your doctor or health care professional if you are unable to keep an appointment. What may interact with this medicine? -certain antibiotics given by injection -NSAIDs, medicines for pain and inflammation, like ibuprofen or naproxen -some diuretics like bumetanide, furosemide -teriparatide -thalidomide This list may not describe all possible interactions. Give your health care provider a list of all the medicines, herbs, non-prescription drugs, or dietary supplements you use. Also tell them if you smoke, drink alcohol, or use illegal drugs. Some items may interact with your medicine. What should I watch for while using this medicine? Visit your doctor or health care professional for regular checkups. It may be some time before you see the benefit from this medicine. Do not stop taking your medicine unless your doctor tells you to. Your doctor may order blood tests or other tests to see how you are doing. Women should inform their doctor if they wish to become pregnant or think they might be pregnant. There is a potential for serious side effects to an unborn child. Talk to   your health care professional or pharmacist for more information. You should make sure that you get enough calcium and vitamin D while you are taking this medicine. Discuss the foods you eat and the vitamins you take with your health  care professional. Some people who take this medicine have severe bone, joint, and/or muscle pain. This medicine may also increase your risk for jaw problems or a broken thigh bone. Tell your doctor right away if you have severe pain in your jaw, bones, joints, or muscles. Tell your doctor if you have any pain that does not go away or that gets worse. Tell your dentist and dental surgeon that you are taking this medicine. You should not have major dental surgery while on this medicine. See your dentist to have a dental exam and fix any dental problems before starting this medicine. Take good care of your teeth while on this medicine. Make sure you see your dentist for regular follow-up appointments. What side effects may I notice from receiving this medicine? Side effects that you should report to your doctor or health care professional as soon as possible: -allergic reactions like skin rash, itching or hives, swelling of the face, lips, or tongue -anxiety, confusion, or depression -breathing problems -changes in vision -eye pain -feeling faint or lightheaded, falls -jaw pain, especially after dental work -mouth sores -muscle cramps, stiffness, or weakness -trouble passing urine or change in the amount of urine Side effects that usually do not require medical attention (report to your doctor or health care professional if they continue or are bothersome): -bone, joint, or muscle pain -constipation -diarrhea -fever -hair loss -irritation at site where injected -loss of appetite -nausea, vomiting -stomach upset -trouble sleeping -trouble swallowing -weak or tired This list may not describe all possible side effects. Call your doctor for medical advice about side effects. You may report side effects to FDA at 1-800-FDA-1088. Where should I keep my medicine? This drug is given in a hospital or clinic and will not be stored at home. NOTE: This sheet is a summary. It may not cover all  possible information. If you have questions about this medicine, talk to your doctor, pharmacist, or health care provider.  2015, [employer] Standard. (2012-06-12 13:03:13)    

## 2014-05-11 ENCOUNTER — Ambulatory Visit (HOSPITAL_BASED_OUTPATIENT_CLINIC_OR_DEPARTMENT_OTHER): Payer: Medicare Other

## 2014-05-11 VITALS — BP 146/66 | HR 68 | Temp 98.3°F | Resp 18

## 2014-05-11 DIAGNOSIS — C9000 Multiple myeloma not having achieved remission: Secondary | ICD-10-CM | POA: Diagnosis not present

## 2014-05-11 DIAGNOSIS — Z5112 Encounter for antineoplastic immunotherapy: Secondary | ICD-10-CM | POA: Diagnosis not present

## 2014-05-11 MED ORDER — SODIUM CHLORIDE 0.9 % IJ SOLN
10.0000 mL | INTRAMUSCULAR | Status: DC | PRN
  Administered 2014-05-11: 10 mL
  Filled 2014-05-11: qty 10

## 2014-05-11 MED ORDER — DEXTROSE 5 % IV SOLN
37.0000 mg/m2 | Freq: Once | INTRAVENOUS | Status: AC
  Administered 2014-05-11: 70 mg via INTRAVENOUS
  Filled 2014-05-11: qty 35

## 2014-05-11 MED ORDER — SODIUM CHLORIDE 0.9 % IV SOLN
Freq: Once | INTRAVENOUS | Status: AC
  Administered 2014-05-11: 09:00:00 via INTRAVENOUS
  Filled 2014-05-11: qty 4

## 2014-05-11 MED ORDER — HEPARIN SOD (PORK) LOCK FLUSH 100 UNIT/ML IV SOLN
500.0000 [IU] | Freq: Once | INTRAVENOUS | Status: AC | PRN
  Administered 2014-05-11: 500 [IU]
  Filled 2014-05-11: qty 5

## 2014-05-11 MED ORDER — SODIUM CHLORIDE 0.9 % IV SOLN
Freq: Once | INTRAVENOUS | Status: AC
  Administered 2014-05-11: 09:00:00 via INTRAVENOUS

## 2014-05-11 MED ORDER — SODIUM CHLORIDE 0.9 % IV SOLN: Freq: Once | INTRAVENOUS | Status: DC

## 2014-05-11 NOTE — Patient Instructions (Signed)
[facility] Discharge Instructions for Patients Receiving Chemotherapy  Today you received the following chemotherapy agents Kryprolis  To help prevent nausea and vomiting after your treatment, we encourage you to take your nausea medication as directed   If you develop nausea and vomiting that is not controlled by your nausea medication, call the clinic.   BELOW ARE SYMPTOMS THAT SHOULD BE REPORTED IMMEDIATELY:  *FEVER GREATER THAN 100.5 F  *CHILLS WITH OR WITHOUT FEVER  NAUSEA AND VOMITING THAT IS NOT CONTROLLED WITH YOUR NAUSEA MEDICATION  *UNUSUAL SHORTNESS OF BREATH  *UNUSUAL BRUISING OR BLEEDING  TENDERNESS IN MOUTH AND THROAT WITH OR WITHOUT PRESENCE OF ULCERS  *URINARY PROBLEMS  *BOWEL PROBLEMS  UNUSUAL RASH Items with * indicate a potential emergency and should be followed up as soon as possible.  Feel free to call the clinic you have any questions or concerns. The clinic phone number is [phone].  Please show the [place] at check-in to the Emergency Department and triage nurse.

## 2014-05-17 ENCOUNTER — Ambulatory Visit (HOSPITAL_BASED_OUTPATIENT_CLINIC_OR_DEPARTMENT_OTHER): Payer: Medicare Other

## 2014-05-17 ENCOUNTER — Ambulatory Visit: Payer: Medicare Other | Admitting: Oncology

## 2014-05-17 ENCOUNTER — Encounter: Payer: Self-pay | Admitting: Oncology

## 2014-05-17 ENCOUNTER — Other Ambulatory Visit (HOSPITAL_BASED_OUTPATIENT_CLINIC_OR_DEPARTMENT_OTHER): Payer: Medicare Other

## 2014-05-17 ENCOUNTER — Other Ambulatory Visit: Payer: Medicare Other

## 2014-05-17 ENCOUNTER — Ambulatory Visit (HOSPITAL_BASED_OUTPATIENT_CLINIC_OR_DEPARTMENT_OTHER): Payer: Medicare Other | Admitting: Oncology

## 2014-05-17 ENCOUNTER — Ambulatory Visit (HOSPITAL_COMMUNITY)
Admission: RE | Admit: 2014-05-17 | Discharge: 2014-05-17 | Disposition: A | Discharge status: Home / Self Care | Payer: Medicare Other | Source: Ambulatory Visit | Attending: Internal Medicine | Admitting: Internal Medicine

## 2014-05-17 VITALS — BP 107/50 | HR 70 | Temp 98.2°F | Resp 18 | Ht 65.0 in | Wt 169.5 lb

## 2014-05-17 DIAGNOSIS — C9000 Multiple myeloma not having achieved remission: Secondary | ICD-10-CM

## 2014-05-17 DIAGNOSIS — D649 Anemia, unspecified: Secondary | ICD-10-CM | POA: Diagnosis not present

## 2014-05-17 DIAGNOSIS — R530 Neoplastic (malignant) related fatigue: Secondary | ICD-10-CM | POA: Diagnosis not present

## 2014-05-17 DIAGNOSIS — Z5111 Encounter for antineoplastic chemotherapy: Secondary | ICD-10-CM

## 2014-05-17 DIAGNOSIS — D6481 Anemia due to antineoplastic chemotherapy: Secondary | ICD-10-CM | POA: Diagnosis not present

## 2014-05-17 DIAGNOSIS — C9002 Multiple myeloma in relapse: Secondary | ICD-10-CM

## 2014-05-17 DIAGNOSIS — Z5112 Encounter for antineoplastic immunotherapy: Secondary | ICD-10-CM | POA: Diagnosis not present

## 2014-05-17 LAB — COMPREHENSIVE METABOLIC PANEL (CC13)
ALT: 43 U/L (ref 0–55)
ANION GAP: 11 meq/L (ref 3–11)
AST: 20 U/L (ref 5–34)
Albumin: 3.4 g/dL — ABNORMAL LOW (ref 3.5–5.0)
Alkaline Phosphatase: 101 U/L (ref 40–150)
BUN: 41 mg/dL — AB (ref 7.0–26.0)
CO2: 21 meq/L — AB (ref 22–29)
Calcium: 8.9 mg/dL (ref 8.4–10.4)
Chloride: 110 mEq/L — ABNORMAL HIGH (ref 98–109)
Creatinine: 3.6 mg/dL (ref 0.6–1.1)
EGFR: 12 mL/min/{1.73_m2} — ABNORMAL LOW (ref >90)
GLUCOSE: 119 mg/dL (ref 70–140)
POTASSIUM: 4.6 meq/L (ref 3.5–5.1)
Sodium: 141 mEq/L (ref 136–145)
Total Bilirubin: 0.58 mg/dL (ref 0.20–1.20)
Total Protein: 5.8 g/dL — ABNORMAL LOW (ref 6.4–8.3)

## 2014-05-17 LAB — CBC WITH DIFFERENTIAL/PLATELET
BASO%: 0.7 % (ref 0.0–2.0)
BASOS ABS: 0 10*3/uL (ref 0.0–0.1)
EOS%: 4.8 % (ref 0.0–7.0)
Eosinophils Absolute: 0.2 10*3/uL (ref 0.0–0.5)
HCT: 25.8 % — ABNORMAL LOW (ref 34.8–46.6)
HGB: 8.3 g/dL — ABNORMAL LOW (ref 11.6–15.9)
LYMPH%: 12 % — AB (ref 14.0–49.7)
MCH: 32.2 pg (ref 25.1–34.0)
MCHC: 32.2 g/dL (ref 31.5–36.0)
MCV: 99.9 fL (ref 79.5–101.0)
MONO#: 0.2 10*3/uL (ref 0.1–0.9)
MONO%: 4.6 % (ref 0.0–14.0)
NEUT#: 4 10*3/uL (ref 1.5–6.5)
NEUT%: 77.9 % — ABNORMAL HIGH (ref 38.4–76.8)
PLATELETS: 154 10*3/uL (ref 145–400)
RBC: 2.59 10*6/uL — ABNORMAL LOW (ref 3.70–5.45)
RDW: 13.2 % (ref 11.2–14.5)
WBC: 5.1 10*3/uL (ref 3.9–10.3)
lymph#: 0.6 10*3/uL — ABNORMAL LOW (ref 0.9–3.3)

## 2014-05-17 MED ORDER — DEXTROSE 5 % IV SOLN
37.0000 mg/m2 | Freq: Once | INTRAVENOUS | Status: AC
  Administered 2014-05-17: 70 mg via INTRAVENOUS
  Filled 2014-05-17: qty 35

## 2014-05-17 MED ORDER — SODIUM CHLORIDE 0.9 % IV SOLN
Freq: Once | INTRAVENOUS | Status: AC
  Administered 2014-05-17: 15:00:00 via INTRAVENOUS
  Filled 2014-05-17: qty 4

## 2014-05-17 MED ORDER — HEPARIN SOD (PORK) LOCK FLUSH 100 UNIT/ML IV SOLN
500.0000 [IU] | Freq: Once | INTRAVENOUS | Status: AC | PRN
  Administered 2014-05-17: 500 [IU]
  Filled 2014-05-17: qty 5

## 2014-05-17 MED ORDER — SODIUM CHLORIDE 0.9 % IJ SOLN
10.0000 mL | INTRAMUSCULAR | Status: DC | PRN
  Administered 2014-05-17: 10 mL
  Filled 2014-05-17: qty 10

## 2014-05-17 MED ORDER — SODIUM CHLORIDE 0.9 % IV SOLN
Freq: Once | INTRAVENOUS | Status: AC
  Administered 2014-05-17: 15:00:00 via INTRAVENOUS

## 2014-05-17 MED ORDER — SODIUM CHLORIDE 0.9 % IV SOLN
300.0000 mg/m2 | Freq: Once | INTRAVENOUS | Status: AC
  Administered 2014-05-17: 560 mg via INTRAVENOUS
  Filled 2014-05-17: qty 28

## 2014-05-17 NOTE — Progress Notes (Signed)
Renal failure addressed per office note.  Pt being treated despite increased creatinine.

## 2014-05-17 NOTE — Progress Notes (Signed)
[place]  [phone] [phone]  OFFICE PROGRESS NOTE   PRINCIPAL DIAGNOSES:  1. Recurrent multiple myeloma initially diagnosed in 2002, initially with smoldering myeloma at [facility].  2. Ductal carcinoma in situ status post mastectomy with sentinel lymph node biopsy in October 2008.  PRIOR THERAPY:  1. Status post treatment with tamoxifen from November 2008 through February 2009, discontinued secondary to intolerance. 2. Status post 3 cycles of chemotherapy with Revlimid and Decadron followed by 1 cycle of Decadron only with mild response. 3. Status post 2 cycles of systemic chemotherapy with Velcade, Doxil and Decadron discontinued secondary to significant peripheral neuropathy. Last dose was given May 2010 at [facility]. 4. Status post autologous peripheral blood stem cell transplant on October 01, 2008 at [facility] under the care of Dr. Phyllis Ginger.  5. Systemic chemotherapy with Carfilzomib initially was 20 mg/M2 and will be increased after cycle #1 to 36 mg/M2 on days 1, 2, 8, 9, 15 and 16 every 4 weeks in addition to Cytoxan 300 mg/M2 and Decadron 40 mg by mouth weekly basis, status post 4 cycles. First cycle on 12/29/2012.    CURRENT THERAPY: She resumed chemotherapy with Carfilzomib, Cytoxan, and Decadron on 05/03/2014.   INTERVAL HISTORY: Nancy Norris 74 y.o. female returns to the clinic today for followup visit. She is feeling fine today with the exception if increased fatigue. She denied having any significant nausea or vomiting, no fever or chills. The patient denied having any chest pain, shortness of breath, cough or hemoptysis. No significant weight loss or night sweats. Her neuropathy is at baseline. The patient is due for cycle 1 day 15 of her chemotherapy today.  MEDICAL HISTORY: Past Medical History  Diagnosis Date  . Hyperkalemia   . Hypothyroidism   . COPD (chronic obstructive pulmonary  disease)   . Hyponatremia   . Dizziness   . Fibromyalgia   . Breast cancer   . Multiple myeloma   . Mucositis   . Hx of echocardiogram     a.  Echocardiogram (12/26/2012): EF 88-87%, grade 1 diastolic dysfunction;   b.  Echocardiogram (02/2013): EF 55-60%, no WMA, trivial effusion  . Fibromyalgia   . Arthritis   . Hx of cardiovascular stress test     LexiScan with low level exercise Myoview (02/2013): No ischemia, EF 72%; normal study  . Complication of anesthesia   . PONV (postoperative nausea and vomiting) 2008    after mastestomy  . Myocardial infarction     in past, patient was unaware.   . Anginal pain     used NTG x 2 May 31 and 06/15/13   . GERD (gastroesophageal reflux disease)   . Headache(784.0)   . Anxiety   . Depression   . CKD (chronic kidney disease) stage 3, GFR 30-59 ml/min   . Anemia   . History of blood transfusion     last one May 12   . Neuropathy     ALLERGIES:  is allergic to codeine; latex; other; onion; adhesive; iodinated diagnostic agents; and sulfa antibiotics.  MEDICATIONS:  Current Outpatient Prescriptions  Medication Sig Dispense Refill  . acetaminophen (TYLENOL) 325 MG tablet Take 650 mg by mouth every 6 (six) hours as needed for mild pain or moderate pain.     Marland Kitchen albuterol (PROVENTIL HFA;VENTOLIN HFA) 108 (90 BASE) MCG/ACT inhaler Inhale 2 puffs into the lungs 2 (two) times daily as needed for wheezing or shortness of breath. 1 each 6  .  ascorbic acid (VITAMIN C) 250 MG CHEW Chew 250 mg by mouth daily.    Marland Kitchen aspirin EC 81 MG tablet Take 1 tablet (81 mg total) by mouth daily.    Marland Kitchen buPROPion (WELLBUTRIN XL) 300 MG 24 hr tablet Take 300 mg by mouth daily.   98  . carboxymethylcellulose (REFRESH PLUS) 0.5 % SOLN Place 1 drop into both eyes every 2 (two) hours.    . Cholecalciferol 4000 UNITS TABS Take 4,000 Units by mouth daily.    . citalopram (CELEXA) 40 MG tablet TAKE 1 TABLET BY MOUTH ONCE DAILY. 90 tablet 0  . cromolyn (OPTICROM) 4 % ophthalmic  solution Place 1 drop into both eyes 4 (four) times daily.    Marland Kitchen dexamethasone (DECADRON) 4 MG tablet Take 10 tablets by mouth once a week as directed 40 tablet 3  . esomeprazole (NEXIUM) 40 MG capsule TAKE 1 CAPSULE BY MOUTH DAILY, EACH MORNING FOR ACID INDIGESTION 30 capsule PRN  . Ferrous Sulfate 134 MG TABS Take 1 tablet by mouth every other day.     . fludrocortisone (FLORINEF) 0.1 MG tablet TAKE 1 TO 2 TABLETS DAILY AS DIRECTED FOR LOW BLOOD PRESSURE. 60 tablet 1  . fluticasone (FLONASE) 50 MCG/ACT nasal spray Place 2 sprays into both nostrils at bedtime.     . gabapentin (NEURONTIN) 300 MG capsule Take 1 capsule (300 mg total) by mouth at bedtime. 90 capsule 3  . isosorbide mononitrate (IMDUR) 30 MG 24 hr tablet TAKE 1 TABLET BY MOUTH DAILY. 30 tablet 11  . levothyroxine (SYNTHROID, LEVOTHROID) 150 MCG tablet TAKE 1 TABLET BY MOUTH ONCE DAILY. (Patient taking differently: TAKE 1 TABLET BY MOUTH ONCE DAILY., 4 times a week and 1 and 1/2 Th, Sat, Sun) 90 tablet 0  . loratadine (CLARITIN) 10 MG tablet Take 10 mg by mouth daily.    Marland Kitchen loteprednol (ALREX) 0.2 % SUSP Place 1 drop into both eyes 4 (four) times daily. 1 Bottle PRN  . meclizine (ANTIVERT) 32 MG tablet Take 1 tablet (32 mg total) by mouth 3 (three) times daily as needed. 30 tablet 0  . midodrine (PROAMATINE) 10 MG tablet Take 1 tablet (10 mg total) by mouth 3 (three) times daily. 90 tablet 11  . nitroGLYCERIN (NITROSTAT) 0.4 MG SL tablet Place 1 tablet (0.4 mg total) under the tongue every 5 (five) minutes as needed for chest pain. 25 tablet 3  . nystatin (MYCOSTATIN/NYSTOP) 100000 UNIT/GM POWD Apply 1 g topically 2 (two) times daily. Apply to belly    . ondansetron (ZOFRAN-ODT) 8 MG disintegrating tablet Take 1 tablet (8 mg total) by mouth every 8 (eight) hours as needed for nausea or vomiting. 20 tablet 3  . polyethylene glycol (MIRALAX / GLYCOLAX) packet Take 17 g by mouth daily.    . prochlorperazine (COMPAZINE) 10 MG tablet TAKE 1  TABLET BY MOUTH EVERY 6 HOURS AS NEEDED FOR NAUSEA OR VOMITING. 60 tablet 0  . ranitidine (ZANTAC) 300 MG tablet TAKE 1 TABLET BY MOUTH AT BEDTIME, FOR ACID REFLUX AND INDIGESTION. 30 tablet PRN  . RESTASIS 0.05 % ophthalmic emulsion Take 1 drop by mouth 2 (two) times daily.  4  . zolpidem (AMBIEN) 5 MG tablet TAKE 1 TABLET BY MOUTH AT BEDTIME AS NEEDED FOR SLEEP. 30 tablet 0   No current facility-administered medications for this visit.   Facility-Administered Medications Ordered in Other Visits  Medication Dose Route Frequency Provider Last Rate Last Dose  . 0.9 %  sodium chloride infusion  Intravenous Once [employer], PA-C      . cyclophosphamide (CYTOXAN) 560 mg in sodium chloride 0.9 % 250 mL chemo infusion  300 mg/m2 (Treatment Plan Actual) Intravenous Once Curt Bears, MD 556 mL/hr at 05/17/14 1554 560 mg at 05/17/14 1554  . heparin lock flush 100 unit/mL  500 Units Intracatheter Once PRN Curt Bears, MD      . sodium chloride 0.9 % injection 10 mL  10 mL Intracatheter PRN Curt Bears, MD   10 mL at 01/05/13 1825  . sodium chloride 0.9 % injection 10 mL  10 mL Intracatheter PRN Curt Bears, MD        SURGICAL HISTORY:  Past Surgical History  Procedure Laterality Date  . History of port removal    . Status post stem cell transplant on September 28, 2008.    Marland Kitchen Abdominal hysterectomy  1981  . Cholecystectomy  1971  . Mastectomy Left 2008  . Cataract extraction, bilateral    . Breast surgery    . Eye surgery Bilateral     lens implant  . Breast reconstruction    . Portacath placement  12/2012    has had 2  . Av fistula placement Left 06/19/2013    Procedure: CREATION OF LEFT ARM ARTERIOVENOUS (AV) FISTULA ;  Surgeon: Angelia Mould, MD;  Location: [facility] OR;  Service: Vascular;  Laterality: Left;    REVIEW OF SYSTEMS:  A comprehensive review of systems was negative except for: Constitutional: positive for fatigue   PHYSICAL EXAMINATION: General  appearance: alert, cooperative and no distress Head: Normocephalic, without obvious abnormality, atraumatic Neck: no adenopathy Lymph nodes: Cervical, supraclavicular, and axillary nodes normal. Resp: clear to auscultation bilaterally Back: symmetric, no curvature. ROM normal. No CVA tenderness. Cardio: regular rate and rhythm, S1, S2 normal, no murmur, click, rub or gallop GI: soft, non-tender; bowel sounds normal; no masses,  no organomegaly Extremities: extremities normal, atraumatic, no cyanosis or edema Neurologic: Alert and oriented X 3, normal strength and tone. Normal symmetric reflexes. Normal coordination and gait  ECOG PERFORMANCE STATUS: 1 - Symptomatic but completely ambulatory  Blood pressure 107/50, pulse 70, temperature 98.2 F (36.8 C), temperature source Oral, resp. rate 18, height $RemoveBe'5\' 5"'bVYXQoLpA$  (1.651 m), weight 169 lb 8 oz (76.885 kg), SpO2 98 %.  LABORATORY DATA: Lab Results  Component Value Date   WBC 5.1 05/17/2014   HGB 8.3* 05/17/2014   HCT 25.8* 05/17/2014   MCV 99.9 05/17/2014   PLT 154 05/17/2014      Chemistry      Component Value Date/Time   NA 141 05/17/2014 1252   NA 138 04/03/2014 0306   K 4.6 05/17/2014 1252   K 3.3* 04/03/2014 0306   CL 110 04/03/2014 0306   CL 105 03/19/2012 0811   CO2 21* 05/17/2014 1252   CO2 22 04/03/2014 0306   BUN 41.0* 05/17/2014 1252   BUN 33* 04/03/2014 0306   CREATININE 3.6* 05/17/2014 1252   CREATININE 4.29* 04/03/2014 0306   CREATININE 3.87* 03/23/2014 1558      Component Value Date/Time   CALCIUM 8.9 05/17/2014 1252   CALCIUM 8.3* 04/03/2014 0306   ALKPHOS 101 05/17/2014 1252   ALKPHOS 71 04/03/2014 0306   AST 20 05/17/2014 1252   AST 20 04/03/2014 0306   ALT 43 05/17/2014 1252   ALT 26 04/03/2014 0306   BILITOT 0.58 05/17/2014 1252   BILITOT 0.7 04/03/2014 0306     Other lab results: Beta-2 microglobulin 10.80, free kappa light  chain 5.67, free lambda light chain 1.05, kappa/lambda ratio 5.40. IgG 688 gA  22 and IgM 31   RADIOGRAPHIC STUDIES:  ASSESSMENT AND PLAN: This is a very pleasant 74 year old white female with relapsed multiple myeloma. She is currently undergoing systemic chemotherapy with Carfilzomib, Cytoxan and dexamethasone status post 4 cycles. She was placed on observation but after consulting with her nephrologist she resumed treatment for her multiple myeloma.   The patient was discussed with and also seen by Dr. Julien Nordmann. The patient is tolerating her chemotherapy well. The patient will proceed with systemic chemotherapy with carfilzomib, cytoxan and dexamethasone. Today is cycle 1 day 15.    The patient has increased fatigue and is anemic with a hemoglobin 8.3. We have discussed that she does not quite meet parameters for blood transfusion, but we will give her 1 unit of packed red blood cells this week. The patient would like to proceed with this transfusion to help her fatigue.  She is scheduled back in 2 weeks prior to the beginning of cycle 2 of her chemotherapy.  She was advised to call immediately if she has any concerning symptoms in the interval.   Mikey Bussing, NP 05/17/2014  ADDENDUM: Hematology/Oncology Attending: I had a face to face encounter with the patient. I recommended her care plan. This is a very pleasant 74 years old white female with recurrent multiple myeloma currently undergoing second course of systemic treatment with Carfilzomib, Cytoxan and Decadron. She status post 2 treatments of the first cycle. She is tolerating the treatment well except for fatigue. I recommended for the patient to proceed with day 15 and 16 of the first cycle as a scheduled this week. She would come back for follow-up visit in 2 weeks for reevaluation before starting cycle #2. For the chemotherapy-induced anemia, will arrange for the patient to receive 1 units of PRBCs transfusion this week. The patient was advised to call immediately if she has any concerning symptoms in  the interval.  Disclaimer: This note was dictated with voice recognition software. Similar sounding words can inadvertently be transcribed and may be missed upon review. [name]., MD 05/17/2014

## 2014-05-17 NOTE — Patient Instructions (Signed)
[place] Discharge Instructions for Patients Receiving Chemotherapy  Today you received the following chemotherapy agents Cytoxan,Kyprolis.  To help prevent nausea and vomiting after your treatment, we encourage you to take your nausea medication as directed.   If you develop nausea and vomiting that is not controlled by your nausea medication, call the clinic.   BELOW ARE SYMPTOMS THAT SHOULD BE REPORTED IMMEDIATELY:  *FEVER GREATER THAN 100.5 F  *CHILLS WITH OR WITHOUT FEVER  NAUSEA AND VOMITING THAT IS NOT CONTROLLED WITH YOUR NAUSEA MEDICATION  *UNUSUAL SHORTNESS OF BREATH  *UNUSUAL BRUISING OR BLEEDING  TENDERNESS IN MOUTH AND THROAT WITH OR WITHOUT PRESENCE OF ULCERS  *URINARY PROBLEMS  *BOWEL PROBLEMS  UNUSUAL RASH Items with * indicate a potential emergency and should be followed up as soon as possible.  Feel free to call the clinic you have any questions or concerns. The clinic phone number is [phone].

## 2014-05-18 ENCOUNTER — Ambulatory Visit (HOSPITAL_BASED_OUTPATIENT_CLINIC_OR_DEPARTMENT_OTHER): Payer: Medicare Other

## 2014-05-18 VITALS — BP 118/52 | HR 81 | Temp 98.4°F | Resp 18

## 2014-05-18 DIAGNOSIS — Z5112 Encounter for antineoplastic immunotherapy: Secondary | ICD-10-CM | POA: Diagnosis not present

## 2014-05-18 DIAGNOSIS — C9000 Multiple myeloma not having achieved remission: Secondary | ICD-10-CM | POA: Diagnosis not present

## 2014-05-18 LAB — PREPARE RBC (CROSSMATCH)

## 2014-05-18 MED ORDER — SODIUM CHLORIDE 0.9 % IJ SOLN
10.0000 mL | INTRAMUSCULAR | Status: DC | PRN
  Administered 2014-05-18: 10 mL
  Filled 2014-05-18: qty 10

## 2014-05-18 MED ORDER — SODIUM CHLORIDE 0.9 % IV SOLN
Freq: Once | INTRAVENOUS | Status: AC
  Administered 2014-05-18: 09:00:00 via INTRAVENOUS
  Filled 2014-05-18: qty 4

## 2014-05-18 MED ORDER — SODIUM CHLORIDE 0.9 % IV SOLN
Freq: Once | INTRAVENOUS | Status: AC
  Administered 2014-05-18: 09:00:00 via INTRAVENOUS

## 2014-05-18 MED ORDER — DEXTROSE 5 % IV SOLN
37.0000 mg/m2 | Freq: Once | INTRAVENOUS | Status: AC
  Administered 2014-05-18: 70 mg via INTRAVENOUS
  Filled 2014-05-18: qty 35

## 2014-05-18 MED ORDER — HEPARIN SOD (PORK) LOCK FLUSH 100 UNIT/ML IV SOLN
500.0000 [IU] | Freq: Once | INTRAVENOUS | Status: AC | PRN
  Administered 2014-05-18: 500 [IU]
  Filled 2014-05-18: qty 5

## 2014-05-18 NOTE — Patient Instructions (Signed)
[facility] Discharge Instructions for Patients Receiving Chemotherapy  Today you received the following chemotherapy agents Kyprolis.  To help prevent nausea and vomiting after your treatment, we encourage you to take your nausea medication as prescribed.   If you develop nausea and vomiting that is not controlled by your nausea medication, call the clinic.   BELOW ARE SYMPTOMS THAT SHOULD BE REPORTED IMMEDIATELY:  *FEVER GREATER THAN 100.5 F  *CHILLS WITH OR WITHOUT FEVER  NAUSEA AND VOMITING THAT IS NOT CONTROLLED WITH YOUR NAUSEA MEDICATION  *UNUSUAL SHORTNESS OF BREATH  *UNUSUAL BRUISING OR BLEEDING  TENDERNESS IN MOUTH AND THROAT WITH OR WITHOUT PRESENCE OF ULCERS  *URINARY PROBLEMS  *BOWEL PROBLEMS  UNUSUAL RASH Items with * indicate a potential emergency and should be followed up as soon as possible.  Feel free to call the clinic you have any questions or concerns. The clinic phone number is [phone].  Please show the CHEMO ALERT CARD at check-in to the Emergency Department and triage nurse.   

## 2014-05-18 NOTE — Progress Notes (Signed)
Patient complains of cramping below rib cage on the right side that started last night. It is intermittent but tender to touch. Dr. Inda Merlin aware with orders to give one percocet for pain relief. Pt refuses percocet. Warm compress applied with some relief. Informed pt to monitor and call clinic for increased or worsening of pain. Patient verbalizes understanding.

## 2014-05-19 ENCOUNTER — Telehealth: Payer: Self-pay | Admitting: Internal Medicine

## 2014-05-19 NOTE — Telephone Encounter (Signed)
Due to patient conflict (mother's admission) 5/16 appointments scheduled for 5/17 lab/AJ/tx and 5/18 tx. S/w pt she is aware. Message sent to KC/MM yesterday.

## 2014-05-20 ENCOUNTER — Other Ambulatory Visit: Payer: Self-pay | Admitting: Medical Oncology

## 2014-05-20 ENCOUNTER — Ambulatory Visit (HOSPITAL_BASED_OUTPATIENT_CLINIC_OR_DEPARTMENT_OTHER): Payer: Medicare Other

## 2014-05-20 VITALS — BP 107/57 | HR 67 | Temp 98.0°F | Resp 18

## 2014-05-20 DIAGNOSIS — D649 Anemia, unspecified: Secondary | ICD-10-CM

## 2014-05-20 LAB — PREPARE RBC (CROSSMATCH)

## 2014-05-20 MED ORDER — ACETAMINOPHEN 325 MG PO TABS
650.0000 mg | ORAL_TABLET | Freq: Once | ORAL | Status: AC
  Administered 2014-05-20: 650 mg via ORAL

## 2014-05-20 MED ORDER — HEPARIN SOD (PORK) LOCK FLUSH 100 UNIT/ML IV SOLN
500.0000 [IU] | Freq: Once | INTRAVENOUS | Status: AC
  Administered 2014-05-20: 500 [IU] via INTRAVENOUS
  Filled 2014-05-20: qty 5

## 2014-05-20 MED ORDER — DIPHENHYDRAMINE HCL 25 MG PO CAPS
ORAL_CAPSULE | ORAL | Status: AC
Start: 2014-05-20 — End: 2014-05-20
  Filled 2014-05-20: qty 1

## 2014-05-20 MED ORDER — SODIUM CHLORIDE 0.9 % IV SOLN
250.0000 mL | Freq: Once | INTRAVENOUS | Status: AC
  Administered 2014-05-20: 250 mL via INTRAVENOUS

## 2014-05-20 MED ORDER — ACETAMINOPHEN 325 MG PO TABS
ORAL_TABLET | ORAL | Status: AC
  Filled 2014-05-20: qty 2

## 2014-05-20 MED ORDER — SODIUM CHLORIDE 0.9 % IJ SOLN
10.0000 mL | INTRAMUSCULAR | Status: DC | PRN
  Administered 2014-05-20: 10 mL via INTRAVENOUS
  Filled 2014-05-20: qty 10

## 2014-05-20 MED ORDER — SODIUM CHLORIDE 0.9 % IJ SOLN
10.0000 mL | INTRAMUSCULAR | Status: DC | PRN
  Filled 2014-05-20: qty 10

## 2014-05-20 MED ORDER — DIPHENHYDRAMINE HCL 25 MG PO CAPS
25.0000 mg | ORAL_CAPSULE | Freq: Once | ORAL | Status: AC
  Administered 2014-05-20: 25 mg via ORAL

## 2014-05-20 NOTE — Patient Instructions (Signed)
Anemia, Nonspecific Anemia is a condition in which the concentration of red blood cells or hemoglobin in the blood is below normal. Hemoglobin is a substance in red blood cells that carries oxygen to the tissues of the body. Anemia results in not enough oxygen reaching these tissues.  CAUSES  Common causes of anemia include:   Excessive bleeding. Bleeding may be internal or external. This includes excessive bleeding from periods (in women) or from the intestine.   Poor nutrition.   Chronic kidney, thyroid, and liver disease.  Bone marrow disorders that decrease red blood cell production.  Cancer and treatments for cancer.  HIV, AIDS, and their treatments.  Spleen problems that increase red blood cell destruction.  Blood disorders.  Excess destruction of red blood cells due to infection, medicines, and autoimmune disorders. SIGNS AND SYMPTOMS   Minor weakness.   Dizziness.   Headache.  Palpitations.   Shortness of breath, especially with exercise.   Paleness.  Cold sensitivity.  Indigestion.  Nausea.  Difficulty sleeping.  Difficulty concentrating. Symptoms may occur suddenly or they may develop slowly.  DIAGNOSIS  Additional blood tests are often needed. These help your health care provider determine the best treatment. Your health care provider will check your stool for blood and look for other causes of blood loss.  TREATMENT  Treatment varies depending on the cause of the anemia. Treatment can include:   Supplements of iron, vitamin B12, or folic acid.   Hormone medicines.   A blood transfusion. This may be needed if blood loss is severe.   Hospitalization. This may be needed if there is significant continual blood loss.   Dietary changes.  Spleen removal. HOME CARE INSTRUCTIONS Keep all follow-up appointments. It often takes many weeks to correct anemia, and having your health care provider check on your condition and your response to  treatment is very important. SEEK IMMEDIATE MEDICAL CARE IF:   You develop extreme weakness, shortness of breath, or chest pain.   You become dizzy or have trouble concentrating.  You develop heavy vaginal bleeding.   You develop a rash.   You have bloody or black, tarry stools.   You faint.   You vomit up blood.   You vomit repeatedly.   You have abdominal pain.  You have a fever or persistent symptoms for more than 2-3 days.   You have a fever and your symptoms suddenly get worse.   You are dehydrated.  MAKE SURE YOU:  Understand these instructions.  Will watch your condition.  Will get help right away if you are not doing well or get worse. Document Released: 02/09/2004 Document Revised: 09/03/2012 Document Reviewed: 06/27/2012 Exit[employer] Patient Information 2015 [employer]. This information is not intended to replace advice given to you by your health care provider. Make sure you discuss any questions you have with your health care provider.  

## 2014-05-21 LAB — TYPE AND SCREEN
ABO/RH(D): O POS
ANTIBODY SCREEN: NEGATIVE
Unit division: 0

## 2014-05-24 ENCOUNTER — Other Ambulatory Visit: Payer: Medicare Other

## 2014-05-24 DIAGNOSIS — N189 Chronic kidney disease, unspecified: Secondary | ICD-10-CM | POA: Diagnosis not present

## 2014-05-24 DIAGNOSIS — N184 Chronic kidney disease, stage 4 (severe): Secondary | ICD-10-CM | POA: Diagnosis not present

## 2014-05-27 DIAGNOSIS — I951 Orthostatic hypotension: Secondary | ICD-10-CM | POA: Diagnosis not present

## 2014-05-27 DIAGNOSIS — R809 Proteinuria, unspecified: Secondary | ICD-10-CM | POA: Diagnosis not present

## 2014-05-27 DIAGNOSIS — N184 Chronic kidney disease, stage 4 (severe): Secondary | ICD-10-CM | POA: Diagnosis not present

## 2014-05-27 DIAGNOSIS — D630 Anemia in neoplastic disease: Secondary | ICD-10-CM | POA: Diagnosis not present

## 2014-05-29 ENCOUNTER — Encounter: Payer: Self-pay | Admitting: *Deleted

## 2014-05-29 ENCOUNTER — Encounter: Payer: Self-pay | Admitting: Internal Medicine

## 2014-05-29 DIAGNOSIS — E2839 Other primary ovarian failure: Secondary | ICD-10-CM

## 2014-05-30 ENCOUNTER — Other Ambulatory Visit: Payer: Self-pay | Admitting: Internal Medicine

## 2014-05-31 ENCOUNTER — Ambulatory Visit: Payer: Medicare Other

## 2014-05-31 ENCOUNTER — Ambulatory Visit: Payer: Medicare Other | Admitting: Physician Assistant

## 2014-05-31 ENCOUNTER — Other Ambulatory Visit: Payer: Medicare Other

## 2014-05-31 ENCOUNTER — Other Ambulatory Visit: Payer: Self-pay | Admitting: Internal Medicine

## 2014-06-01 ENCOUNTER — Other Ambulatory Visit (HOSPITAL_BASED_OUTPATIENT_CLINIC_OR_DEPARTMENT_OTHER): Payer: Medicare Other

## 2014-06-01 ENCOUNTER — Encounter: Payer: Self-pay | Admitting: Physician Assistant

## 2014-06-01 ENCOUNTER — Telehealth: Payer: Self-pay | Admitting: Internal Medicine

## 2014-06-01 ENCOUNTER — Ambulatory Visit (HOSPITAL_BASED_OUTPATIENT_CLINIC_OR_DEPARTMENT_OTHER): Payer: Medicare Other | Admitting: Physician Assistant

## 2014-06-01 ENCOUNTER — Ambulatory Visit (HOSPITAL_BASED_OUTPATIENT_CLINIC_OR_DEPARTMENT_OTHER): Payer: Medicare Other

## 2014-06-01 VITALS — BP 113/52 | HR 82 | Temp 98.2°F | Resp 18 | Ht 65.0 in | Wt 166.8 lb

## 2014-06-01 DIAGNOSIS — R1310 Dysphagia, unspecified: Secondary | ICD-10-CM | POA: Diagnosis not present

## 2014-06-01 DIAGNOSIS — C9002 Multiple myeloma in relapse: Secondary | ICD-10-CM

## 2014-06-01 DIAGNOSIS — C9000 Multiple myeloma not having achieved remission: Secondary | ICD-10-CM

## 2014-06-01 DIAGNOSIS — Z5111 Encounter for antineoplastic chemotherapy: Secondary | ICD-10-CM

## 2014-06-01 DIAGNOSIS — Z5112 Encounter for antineoplastic immunotherapy: Secondary | ICD-10-CM | POA: Diagnosis not present

## 2014-06-01 LAB — CBC WITH DIFFERENTIAL/PLATELET
BASO%: 1.1 % (ref 0.0–2.0)
BASOS ABS: 0.1 10*3/uL (ref 0.0–0.1)
EOS%: 6.9 % (ref 0.0–7.0)
Eosinophils Absolute: 0.6 10*3/uL — ABNORMAL HIGH (ref 0.0–0.5)
HEMATOCRIT: 32.8 % — AB (ref 34.8–46.6)
HEMOGLOBIN: 10.6 g/dL — AB (ref 11.6–15.9)
LYMPH%: 18.4 % (ref 14.0–49.7)
MCH: 32.3 pg (ref 25.1–34.0)
MCHC: 32.4 g/dL (ref 31.5–36.0)
MCV: 99.7 fL (ref 79.5–101.0)
MONO#: 1.6 10*3/uL — ABNORMAL HIGH (ref 0.1–0.9)
MONO%: 20.2 % — AB (ref 0.0–14.0)
NEUT#: 4.4 10*3/uL (ref 1.5–6.5)
NEUT%: 53.4 % (ref 38.4–76.8)
Platelets: 243 10*3/uL (ref 145–400)
RBC: 3.29 10*6/uL — ABNORMAL LOW (ref 3.70–5.45)
RDW: 13.7 % (ref 11.2–14.5)
WBC: 8.2 10*3/uL (ref 3.9–10.3)
lymph#: 1.5 10*3/uL (ref 0.9–3.3)

## 2014-06-01 LAB — COMPREHENSIVE METABOLIC PANEL (CC13)
ALBUMIN: 4 g/dL (ref 3.5–5.0)
ALK PHOS: 104 U/L (ref 40–150)
ALT: 17 U/L (ref 0–55)
AST: 13 U/L (ref 5–34)
Anion Gap: 11 mEq/L (ref 3–11)
BUN: 36.4 mg/dL — AB (ref 7.0–26.0)
CHLORIDE: 106 meq/L (ref 98–109)
CO2: 25 mEq/L (ref 22–29)
CREATININE: 3.6 mg/dL — AB (ref 0.6–1.1)
Calcium: 10.5 mg/dL — ABNORMAL HIGH (ref 8.4–10.4)
EGFR: 12 mL/min/{1.73_m2} — ABNORMAL LOW (ref >90)
Glucose: 93 mg/dl (ref 70–140)
POTASSIUM: 4.2 meq/L (ref 3.5–5.1)
Sodium: 142 mEq/L (ref 136–145)
Total Bilirubin: 0.95 mg/dL (ref 0.20–1.20)
Total Protein: 6.5 g/dL (ref 6.4–8.3)

## 2014-06-01 MED ORDER — SODIUM CHLORIDE 0.9 % IV SOLN
Freq: Once | INTRAVENOUS | Status: AC
  Administered 2014-06-01: 12:00:00 via INTRAVENOUS

## 2014-06-01 MED ORDER — SODIUM CHLORIDE 0.9 % IJ SOLN
10.0000 mL | INTRAMUSCULAR | Status: DC | PRN
  Administered 2014-06-01: 10 mL
  Filled 2014-06-01: qty 10

## 2014-06-01 MED ORDER — SODIUM CHLORIDE 0.9 % IV SOLN
Freq: Once | INTRAVENOUS | Status: AC
  Administered 2014-06-01: 13:00:00 via INTRAVENOUS
  Filled 2014-06-01: qty 4

## 2014-06-01 MED ORDER — SODIUM CHLORIDE 0.9 % IV SOLN
300.0000 mg/m2 | Freq: Once | INTRAVENOUS | Status: AC
  Administered 2014-06-01: 560 mg via INTRAVENOUS
  Filled 2014-06-01: qty 28

## 2014-06-01 MED ORDER — SODIUM CHLORIDE 0.9 % IV SOLN
Freq: Once | INTRAVENOUS | Status: AC
  Administered 2014-06-01: 13:00:00 via INTRAVENOUS

## 2014-06-01 MED ORDER — DEXTROSE 5 % IV SOLN
37.0000 mg/m2 | Freq: Once | INTRAVENOUS | Status: AC
  Administered 2014-06-01: 70 mg via INTRAVENOUS
  Filled 2014-06-01: qty 35

## 2014-06-01 MED ORDER — HEPARIN SOD (PORK) LOCK FLUSH 100 UNIT/ML IV SOLN
500.0000 [IU] | Freq: Once | INTRAVENOUS | Status: AC | PRN
  Administered 2014-06-01: 500 [IU]
  Filled 2014-06-01: qty 5

## 2014-06-01 NOTE — Telephone Encounter (Signed)
Gave adn printed appt sched and avs for pt for May and JUNE

## 2014-06-01 NOTE — Patient Instructions (Signed)
Continue labs and chemotherapy as scheduled Followup in 2 weeks for another symptom management visit 

## 2014-06-01 NOTE — Progress Notes (Signed)
[place]  [phone] [phone]  OFFICE PROGRESS NOTE   PRINCIPAL DIAGNOSES:  1. Recurrent multiple myeloma initially diagnosed in 2002, initially with smoldering myeloma at [facility].  2. Ductal carcinoma in situ status post mastectomy with sentinel lymph node biopsy in October 2008.  PRIOR THERAPY:  1. Status post treatment with tamoxifen from November 2008 through February 2009, discontinued secondary to intolerance. 2. Status post 3 cycles of chemotherapy with Revlimid and Decadron followed by 1 cycle of Decadron only with mild response. 3. Status post 2 cycles of systemic chemotherapy with Velcade, Doxil and Decadron discontinued secondary to significant peripheral neuropathy. Last dose was given May 2010 at [facility]. 4. Status post autologous peripheral blood stem cell transplant on October 01, 2008 at [facility] under the care of Dr. Phyllis Ginger.  5. Systemic chemotherapy with Carfilzomib initially was 20 mg/M2 and will be increased after cycle #1 to 36 mg/M2 on days 1, 2, 8, 9, 15 and 16 every 4 weeks in addition to Cytoxan 300 mg/M2 and Decadron 40 mg by mouth weekly basis, status post 4 cycles. First cycle on 12/29/2012.    CURRENT THERAPY: She resumed chemotherapy with Carfilzomib, Cytoxan, and Decadron on 05/03/2014. Status post 1 cycle  INTERVAL HISTORY: Nancy Norris 74 y.o. female returns to the clinic today for followup visit. She is feeling fine today with the exception if increased fatigue. She denied having any significant nausea or vomiting, no fever or chills. The patient denied having any chest pain, shortness of breath, cough or hemoptysis. No significant weight loss or night sweats. Her neuropathy is at baseline. She tolerated her first cycle of chemotherapy with carfilzomib Cytoxan and dexamethasone relatively well the exception of some nausea and constipation. She reports some trouble  swallowing but this is improving. She reports she has gained some renal function according to her nephrologist she has gone from 9% to 13%. She is very excited about this improvement after only completing one cycle of chemotherapy The patient is due for cycle 2 day 1 of her chemotherapy today.  MEDICAL HISTORY: Past Medical History  Diagnosis Date  . Hyperkalemia   . Hypothyroidism   . COPD (chronic obstructive pulmonary disease)   . Hyponatremia   . Dizziness   . Fibromyalgia   . Breast cancer   . Multiple myeloma   . Mucositis   . Hx of echocardiogram     a.  Echocardiogram (12/26/2012): EF 45-40%, grade 1 diastolic dysfunction;   b.  Echocardiogram (02/2013): EF 55-60%, no WMA, trivial effusion  . Fibromyalgia   . Arthritis   . Hx of cardiovascular stress test     LexiScan with low level exercise Myoview (02/2013): No ischemia, EF 72%; normal study  . Complication of anesthesia   . PONV (postoperative nausea and vomiting) 2008    after mastestomy  . Myocardial infarction     in past, patient was unaware.   . Anginal pain     used NTG x 2 May 31 and 06/15/13   . GERD (gastroesophageal reflux disease)   . Headache(784.0)   . Anxiety   . Depression   . CKD (chronic kidney disease) stage 3, GFR 30-59 ml/min   . Anemia   . History of blood transfusion     last one May 12   . Neuropathy     ALLERGIES:  is allergic to codeine; latex; other; onion; adhesive; iodinated diagnostic agents; and sulfa antibiotics.  MEDICATIONS:  Current  Outpatient Prescriptions  Medication Sig Dispense Refill  . acetaminophen (TYLENOL) 325 MG tablet Take 650 mg by mouth every 6 (six) hours as needed for mild pain or moderate pain.     Marland Kitchen albuterol (PROVENTIL HFA;VENTOLIN HFA) 108 (90 BASE) MCG/ACT inhaler Inhale 2 puffs into the lungs 2 (two) times daily as needed for wheezing or shortness of breath. 1 each 6  . ascorbic acid (VITAMIN C) 250 MG CHEW Chew 250 mg by mouth daily.    Marland Kitchen aspirin EC 81 MG  tablet Take 1 tablet (81 mg total) by mouth daily.    Marland Kitchen buPROPion (WELLBUTRIN XL) 300 MG 24 hr tablet Take 300 mg by mouth daily.   98  . carboxymethylcellulose (REFRESH PLUS) 0.5 % SOLN Place 1 drop into both eyes every 2 (two) hours.    . Cholecalciferol 4000 UNITS TABS Take 4,000 Units by mouth daily.    . citalopram (CELEXA) 40 MG tablet TAKE 1 TABLET BY MOUTH ONCE DAILY. 90 tablet 0  . cromolyn (OPTICROM) 4 % ophthalmic solution Place 1 drop into both eyes 4 (four) times daily.    Marland Kitchen dexamethasone (DECADRON) 4 MG tablet Take 10 tablets by mouth once a week as directed 40 tablet 3  . esomeprazole (NEXIUM) 40 MG capsule TAKE 1 CAPSULE BY MOUTH DAILY, EACH MORNING FOR ACID INDIGESTION 30 capsule PRN  . Ferrous Sulfate 134 MG TABS Take 1 tablet by mouth every other day.     . fludrocortisone (FLORINEF) 0.1 MG tablet TAKE 1 TO 2 TABLETS DAILY AS DIRECTED FOR LOW BLOOD PRESSURE. 60 tablet 1  . fluticasone (FLONASE) 50 MCG/ACT nasal spray Place 2 sprays into both nostrils at bedtime.     . gabapentin (NEURONTIN) 300 MG capsule Take 1 capsule (300 mg total) by mouth at bedtime. 90 capsule 3  . isosorbide mononitrate (IMDUR) 30 MG 24 hr tablet TAKE 1 TABLET BY MOUTH DAILY. 30 tablet 11  . levothyroxine (SYNTHROID, LEVOTHROID) 150 MCG tablet TAKE 1 TABLET BY MOUTH ONCE DAILY. (Patient taking differently: TAKE 1 TABLET BY MOUTH ONCE DAILY., 4 times a week and 1 and 1/2 Th, Sat, Sun) 90 tablet 0  . loratadine (CLARITIN) 10 MG tablet Take 10 mg by mouth daily.    Marland Kitchen loteprednol (ALREX) 0.2 % SUSP Place 1 drop into both eyes 4 (four) times daily. 1 Bottle PRN  . meclizine (ANTIVERT) 32 MG tablet Take 1 tablet (32 mg total) by mouth 3 (three) times daily as needed. 30 tablet 0  . midodrine (PROAMATINE) 10 MG tablet Take 1 tablet (10 mg total) by mouth 3 (three) times daily. 90 tablet 11  . nitroGLYCERIN (NITROSTAT) 0.4 MG SL tablet Place 1 tablet (0.4 mg total) under the tongue every 5 (five) minutes as needed  for chest pain. 25 tablet 3  . nystatin (MYCOSTATIN/NYSTOP) 100000 UNIT/GM POWD Apply 1 g topically 2 (two) times daily. Apply to belly    . ondansetron (ZOFRAN-ODT) 8 MG disintegrating tablet Take 1 tablet (8 mg total) by mouth every 8 (eight) hours as needed for nausea or vomiting. 20 tablet 3  . polyethylene glycol (MIRALAX / GLYCOLAX) packet Take 17 g by mouth daily.    . prochlorperazine (COMPAZINE) 10 MG tablet TAKE 1 TABLET BY MOUTH EVERY 6 HOURS AS NEEDED FOR NAUSEA OR VOMITING. 60 tablet 0  . ranitidine (ZANTAC) 300 MG tablet TAKE 1 TABLET BY MOUTH AT BEDTIME, FOR ACID REFLUX AND INDIGESTION. 30 tablet PRN  . RESTASIS 0.05 % ophthalmic emulsion Place  1 drop into both eyes 2 (two) times daily.   4  . zolpidem (AMBIEN) 5 MG tablet TAKE 1 TABLET BY MOUTH AT BEDTIME AS NEEDED FOR SLEEP. 30 tablet 0   No current facility-administered medications for this visit.   Facility-Administered Medications Ordered in Other Visits  Medication Dose Route Frequency Provider Last Rate Last Dose  . 0.9 %  sodium chloride infusion   Intravenous Once Cynthia Cogle E Krystall Kruckenberg, PA-C      . sodium chloride 0.9 % injection 10 mL  10 mL Intracatheter PRN Curt Bears, MD   10 mL at 01/05/13 1825  . sodium chloride 0.9 % injection 10 mL  10 mL Intracatheter PRN Curt Bears, MD   10 mL at 06/01/14 1450    SURGICAL HISTORY:  Past Surgical History  Procedure Laterality Date  . History of port removal    . Status post stem cell transplant on September 28, 2008.    Marland Kitchen Abdominal hysterectomy  1981  . Cholecystectomy  1971  . Mastectomy Left 2008  . Cataract extraction, bilateral    . Breast surgery    . Eye surgery Bilateral     lens implant  . Breast reconstruction    . Portacath placement  12/2012    has had 2  . Av fistula placement Left 06/19/2013    Procedure: CREATION OF LEFT ARM ARTERIOVENOUS (AV) FISTULA ;  Surgeon: Angelia Mould, MD;  Location: [facility] OR;  Service: Vascular;  Laterality: Left;     REVIEW OF SYSTEMS:  A comprehensive review of systems was negative except for: Constitutional: positive for fatigue Gastrointestinal: positive for constipation, dysphagia and nausea   PHYSICAL EXAMINATION: General appearance: alert, cooperative and no distress Head: Normocephalic, without obvious abnormality, atraumatic Neck: no adenopathy Lymph nodes: Cervical, supraclavicular, and axillary nodes normal. Resp: clear to auscultation bilaterally Back: symmetric, no curvature. ROM normal. No CVA tenderness. Cardio: regular rate and rhythm, S1, S2 normal, no murmur, click, rub or gallop GI: soft, non-tender; bowel sounds normal; no masses,  no organomegaly Extremities: extremities normal, atraumatic, no cyanosis or edema Neurologic: Alert and oriented X 3, normal strength and tone. Normal symmetric reflexes. Normal coordination and gait  ECOG PERFORMANCE STATUS: 1 - Symptomatic but completely ambulatory  Blood pressure 113/52, pulse 82, temperature 98.2 F (36.8 C), temperature source Oral, resp. rate 18, height $RemoveBe'5\' 5"'pzzrgcgly$  (1.651 m), weight 166 lb 12.8 oz (75.66 kg), SpO2 98 %.  LABORATORY DATA: Lab Results  Component Value Date   WBC 8.2 06/01/2014   HGB 10.6* 06/01/2014   HCT 32.8* 06/01/2014   MCV 99.7 06/01/2014   PLT 243 06/01/2014      Chemistry      Component Value Date/Time   NA 142 06/01/2014 1031   NA 138 04/03/2014 0306   K 4.2 06/01/2014 1031   K 3.3* 04/03/2014 0306   CL 110 04/03/2014 0306   CL 105 03/19/2012 0811   CO2 25 06/01/2014 1031   CO2 22 04/03/2014 0306   BUN 36.4* 06/01/2014 1031   BUN 33* 04/03/2014 0306   CREATININE 3.6* 06/01/2014 1031   CREATININE 4.29* 04/03/2014 0306   CREATININE 3.87* 03/23/2014 1558      Component Value Date/Time   CALCIUM 10.5* 06/01/2014 1031   CALCIUM 8.3* 04/03/2014 0306   ALKPHOS 104 06/01/2014 1031   ALKPHOS 71 04/03/2014 0306   AST 13 06/01/2014 1031   AST 20 04/03/2014 0306   ALT 17 06/01/2014 1031   ALT 26  04/03/2014 0306  BILITOT 0.95 06/01/2014 1031   BILITOT 0.7 04/03/2014 0306     Other lab results: Beta-2 microglobulin 10.80, free kappa light chain 5.67, free lambda light chain 1.05, kappa/lambda ratio 5.40. IgG 688 gA 22 and IgM 31   RADIOGRAPHIC STUDIES:  ASSESSMENT AND PLAN: This is a very pleasant 74 year old white female with relapsed multiple myeloma. She is currently undergoing systemic chemotherapy with Carfilzomib, Cytoxan and dexamethasone status post 4 cycles. She was placed on observation but after consulting with her nephrologist she resumed treatment for her multiple myeloma.   The patient is tolerating her chemotherapy well. The patient will proceed with systemic chemotherapy with carfilzomib, cytoxan and dexamethasone. She is status post 1 cycle. Overall she tolerated her first cycle relatively well with the exception of some fatigue, nausea and constipation. She also reports some trouble swallowing but this is improving. She'll proceed with day 1 of cycle #2 of her systemic chemotherapy with carfilzomib, Cytoxan and dexamethasone. She'll follow-up in 2 weeks for a symptom management visit with repeat laboratory studies. We will  check  restaging protein studies after she completes either 2 or 3 cycles of chemotherapy.  She is scheduled back in 2 weeks prior to the beginning of cycle 2 of her chemotherapy.  She was advised to call immediately if she has any concerning symptoms in the interval.   Carlton Adam, PA-C 06/01/2014

## 2014-06-01 NOTE — Patient Instructions (Signed)
[facility] Discharge Instructions for Patients Receiving Chemotherapy  Today you received the following chemotherapy agents Cytoxan and Kyprolis. To help prevent nausea and vomiting after your treatment, we encourage you to take your nausea medication as prescribed.   If you develop nausea and vomiting that is not controlled by your nausea medication, call the clinic.   BELOW ARE SYMPTOMS THAT SHOULD BE REPORTED IMMEDIATELY:  *FEVER GREATER THAN 100.5 F  *CHILLS WITH OR WITHOUT FEVER  NAUSEA AND VOMITING THAT IS NOT CONTROLLED WITH YOUR NAUSEA MEDICATION  *UNUSUAL SHORTNESS OF BREATH  *UNUSUAL BRUISING OR BLEEDING  TENDERNESS IN MOUTH AND THROAT WITH OR WITHOUT PRESENCE OF ULCERS  *URINARY PROBLEMS  *BOWEL PROBLEMS  UNUSUAL RASH Items with * indicate a potential emergency and should be followed up as soon as possible.  Feel free to call the clinic you have any questions or concerns. The clinic phone number is [phone].  Please show the CHEMO ALERT CARD at check-in to the Emergency Department and triage nurse.   

## 2014-06-02 ENCOUNTER — Ambulatory Visit (HOSPITAL_BASED_OUTPATIENT_CLINIC_OR_DEPARTMENT_OTHER): Payer: Medicare Other

## 2014-06-02 DIAGNOSIS — Z5112 Encounter for antineoplastic immunotherapy: Secondary | ICD-10-CM

## 2014-06-02 DIAGNOSIS — C9000 Multiple myeloma not having achieved remission: Secondary | ICD-10-CM | POA: Diagnosis not present

## 2014-06-02 MED ORDER — DEXTROSE 5 % IV SOLN
37.0000 mg/m2 | Freq: Once | INTRAVENOUS | Status: AC
  Administered 2014-06-02: 70 mg via INTRAVENOUS
  Filled 2014-06-02: qty 35

## 2014-06-02 MED ORDER — SODIUM CHLORIDE 0.9 % IV SOLN
Freq: Once | INTRAVENOUS | Status: AC
  Administered 2014-06-02: 12:00:00 via INTRAVENOUS

## 2014-06-02 MED ORDER — HEPARIN SOD (PORK) LOCK FLUSH 100 UNIT/ML IV SOLN
500.0000 [IU] | Freq: Once | INTRAVENOUS | Status: AC | PRN
  Administered 2014-06-02: 500 [IU]
  Filled 2014-06-02: qty 5

## 2014-06-02 MED ORDER — SODIUM CHLORIDE 0.9 % IJ SOLN
10.0000 mL | INTRAMUSCULAR | Status: DC | PRN
  Administered 2014-06-02: 10 mL
  Filled 2014-06-02: qty 10

## 2014-06-02 MED ORDER — DEXAMETHASONE SODIUM PHOSPHATE 100 MG/10ML IJ SOLN
Freq: Once | INTRAMUSCULAR | Status: AC
  Administered 2014-06-02: 13:00:00 via INTRAVENOUS
  Filled 2014-06-02: qty 4

## 2014-06-02 NOTE — Patient Instructions (Signed)
Carfilzomib injection What is this medicine? CARFILZOMIB (kar FILZ oh mib) is a chemotherapy drug that works by slowing or stopping cancer cell growth. This medicine is used to treat multiple myeloma. This medicine may be used for other purposes; ask your health care provider or pharmacist if you have questions. COMMON BRAND NAME(S): KYPROLIS What should I tell my health care provider before I take this medicine? They need to know if you have any of these conditions: -heart disease -irregular heartbeat -liver disease -lung or breathing disease -an unusual or allergic reaction to carfilzomib, or other medicines, foods, dyes, or preservatives -pregnant or trying to get pregnant -breast-feeding How should I use this medicine? This medicine is for injection or infusion into a vein. It is given by a health care professional in a hospital or clinic setting. Talk to your pediatrician regarding the use of this medicine in children. Special care may be needed. Overdosage: If you think you've taken too much of this medicine contact a poison control center or emergency room at once. Overdosage: If you think you have taken too much of this medicine contact a poison control center or emergency room at once. NOTE: This medicine is only for you. Do not share this medicine with others. What if I miss a dose? It is important not to miss your dose. Call your doctor or health care professional if you are unable to keep an appointment. What may interact with this medicine? Interactions are not expected. Give your health care provider a list of all the medicines, herbs, non-prescription drugs, or dietary supplements you use. Also tell them if you smoke, drink alcohol, or use illegal drugs. Some items may interact with your medicine. This list may not describe all possible interactions. Give your health care provider a list of all the medicines, herbs, non-prescription drugs, or dietary supplements you use. Also  tell them if you smoke, drink alcohol, or use illegal drugs. Some items may interact with your medicine. What should I watch for while using this medicine? Your condition will be monitored carefully while you are receiving this medicine. Report any side effects. Continue your course of treatment even though you feel ill unless your doctor tells you to stop. Call your doctor or health care professional for advice if you get a fever, chills or sore throat, or other symptoms of a cold or flu. Do not treat yourself. Try to avoid being around people who are sick. Do not become pregnant while taking this medicine. Women should inform their doctor if they wish to become pregnant or think they might be pregnant. There is a potential for serious side effects to an unborn child. Talk to your health care professional or pharmacist for more information. Do not breast-feed an infant while taking this medicine. Check with your doctor or health care professional if you get an attack of severe diarrhea, nausea and vomiting, or if you sweat a lot. The loss of too much body fluid can make it dangerous for you to take this medicine. You may get dizzy. Do not drive, use machinery, or do anything that needs mental alertness until you know how this medicine affects you. Do not stand or sit up quickly, especially if you are an older patient. This reduces the risk of dizzy or fainting spells. What side effects may I notice from receiving this medicine? Side effects that you should report to your doctor or health care professional as soon as possible: -allergic reactions like skin rash, itching or hives,  swelling of the face, lips, or tongue -breathing problems -chest pain or palpitationschest tightness -cough -dark urine -dizziness -feeling faint or lightheaded -fever or chills -general ill feeling or flu-like symptoms -light-colored stools -palpitations -right upper belly pain -swelling of the legs or ankles -unusual  bleeding or bruising -unusually weak or tired -yellowing of the eyes or skin Side effects that usually do not require medical attention (Report these to your doctor or health care professional if they continue or are bothersome.): -diarrhea -headache -nausea, vomiting -tiredness This list may not describe all possible side effects. Call your doctor for medical advice about side effects. You may report side effects to FDA at 1-800-FDA-1088. Where should I keep my medicine? This drug is given in a hospital or clinic and will not be stored at home. NOTE: This sheet is a summary. It may not cover all possible information. If you have questions about this medicine, talk to your doctor, pharmacist, or health care provider.  2015, [employer] Standard. (2011-06-22 17:02:29)

## 2014-06-02 NOTE — Progress Notes (Signed)
Per dr Alvy Bimler okay to treat with lab values.

## 2014-06-03 ENCOUNTER — Encounter: Payer: Self-pay | Admitting: Internal Medicine

## 2014-06-07 ENCOUNTER — Other Ambulatory Visit (HOSPITAL_BASED_OUTPATIENT_CLINIC_OR_DEPARTMENT_OTHER): Payer: Medicare Other

## 2014-06-07 ENCOUNTER — Ambulatory Visit (HOSPITAL_BASED_OUTPATIENT_CLINIC_OR_DEPARTMENT_OTHER): Payer: Medicare Other

## 2014-06-07 ENCOUNTER — Other Ambulatory Visit: Payer: Self-pay | Admitting: Physician Assistant

## 2014-06-07 VITALS — BP 121/52 | HR 72 | Temp 98.0°F | Resp 18

## 2014-06-07 DIAGNOSIS — Z5112 Encounter for antineoplastic immunotherapy: Secondary | ICD-10-CM | POA: Diagnosis not present

## 2014-06-07 DIAGNOSIS — Z5111 Encounter for antineoplastic chemotherapy: Secondary | ICD-10-CM | POA: Diagnosis not present

## 2014-06-07 DIAGNOSIS — C9000 Multiple myeloma not having achieved remission: Secondary | ICD-10-CM

## 2014-06-07 DIAGNOSIS — C9001 Multiple myeloma in remission: Secondary | ICD-10-CM

## 2014-06-07 DIAGNOSIS — D649 Anemia, unspecified: Secondary | ICD-10-CM | POA: Diagnosis not present

## 2014-06-07 DIAGNOSIS — Z853 Personal history of malignant neoplasm of breast: Secondary | ICD-10-CM

## 2014-06-07 LAB — CBC WITH DIFFERENTIAL/PLATELET
BASO%: 0.1 % (ref 0.0–2.0)
BASOS ABS: 0 10*3/uL (ref 0.0–0.1)
EOS%: 20.4 % — AB (ref 0.0–7.0)
Eosinophils Absolute: 1.6 10*3/uL — ABNORMAL HIGH (ref 0.0–0.5)
HCT: 27.2 % — ABNORMAL LOW (ref 34.8–46.6)
HGB: 8.8 g/dL — ABNORMAL LOW (ref 11.6–15.9)
LYMPH%: 10.5 % — AB (ref 14.0–49.7)
MCH: 32.5 pg (ref 25.1–34.0)
MCHC: 32.4 g/dL (ref 31.5–36.0)
MCV: 100.4 fL (ref 79.5–101.0)
MONO#: 1.3 10*3/uL — ABNORMAL HIGH (ref 0.1–0.9)
MONO%: 15.9 % — AB (ref 0.0–14.0)
NEUT#: 4.2 10*3/uL (ref 1.5–6.5)
NEUT%: 53.1 % (ref 38.4–76.8)
NRBC: 0 % (ref 0–0)
Platelets: 95 10*3/uL — ABNORMAL LOW (ref 145–400)
RBC: 2.71 10*6/uL — ABNORMAL LOW (ref 3.70–5.45)
RDW: 14.3 % (ref 11.2–14.5)
WBC: 7.8 10*3/uL (ref 3.9–10.3)
lymph#: 0.8 10*3/uL — ABNORMAL LOW (ref 0.9–3.3)

## 2014-06-07 LAB — COMPREHENSIVE METABOLIC PANEL (CC13)
ALT: 15 U/L (ref 0–55)
AST: 15 U/L (ref 5–34)
Albumin: 3 g/dL — ABNORMAL LOW (ref 3.5–5.0)
Alkaline Phosphatase: 77 U/L (ref 40–150)
Anion Gap: 14 mEq/L — ABNORMAL HIGH (ref 3–11)
BUN: 46.8 mg/dL — ABNORMAL HIGH (ref 7.0–26.0)
CALCIUM: 8.9 mg/dL (ref 8.4–10.4)
CHLORIDE: 109 meq/L (ref 98–109)
CO2: 20 meq/L — AB (ref 22–29)
Creatinine: 4.3 mg/dL (ref 0.6–1.1)
EGFR: 10 mL/min/{1.73_m2} — AB (ref >90)
Glucose: 100 mg/dl (ref 70–140)
POTASSIUM: 4 meq/L (ref 3.5–5.1)
Sodium: 144 mEq/L (ref 136–145)
Total Bilirubin: 0.61 mg/dL (ref 0.20–1.20)
Total Protein: 5.2 g/dL — ABNORMAL LOW (ref 6.4–8.3)

## 2014-06-07 MED ORDER — SODIUM CHLORIDE 0.9 % IV SOLN
Freq: Once | INTRAVENOUS | Status: AC
  Administered 2014-06-07: 10:00:00 via INTRAVENOUS

## 2014-06-07 MED ORDER — HEPARIN SOD (PORK) LOCK FLUSH 100 UNIT/ML IV SOLN
500.0000 [IU] | Freq: Once | INTRAVENOUS | Status: AC | PRN
  Administered 2014-06-07: 500 [IU]
  Filled 2014-06-07: qty 5

## 2014-06-07 MED ORDER — CYCLOPHOSPHAMIDE CHEMO INJECTION 1 GM
300.0000 mg/m2 | Freq: Once | INTRAMUSCULAR | Status: AC
  Administered 2014-06-07: 560 mg via INTRAVENOUS
  Filled 2014-06-07: qty 28

## 2014-06-07 MED ORDER — ZOLEDRONIC ACID 4 MG/5ML IV CONC: 4.0000 mg | Freq: Once | INTRAVENOUS | Status: DC

## 2014-06-07 MED ORDER — DEXTROSE 5 % IV SOLN
37.0000 mg/m2 | Freq: Once | INTRAVENOUS | Status: AC
  Administered 2014-06-07: 70 mg via INTRAVENOUS
  Filled 2014-06-07: qty 35

## 2014-06-07 MED ORDER — SODIUM CHLORIDE 0.9 % IJ SOLN
10.0000 mL | INTRAMUSCULAR | Status: DC | PRN
  Administered 2014-06-07: 10 mL
  Filled 2014-06-07: qty 10

## 2014-06-07 MED ORDER — SODIUM CHLORIDE 0.9 % IV SOLN
Freq: Once | INTRAVENOUS | Status: AC
  Administered 2014-06-07: 10:00:00 via INTRAVENOUS
  Filled 2014-06-07: qty 4

## 2014-06-07 NOTE — Patient Instructions (Signed)
[facility] Discharge Instructions for Patients Receiving Chemotherapy  Today you received the following chemotherapy agents zometa, cytoxan, kyprolis  To help prevent nausea and vomiting after your treatment, we encourage you to take your nausea medication as needed.   If you develop nausea and vomiting that is not controlled by your nausea medication, call the clinic.   BELOW ARE SYMPTOMS THAT SHOULD BE REPORTED IMMEDIATELY:  *FEVER GREATER THAN 100.5 F  *CHILLS WITH OR WITHOUT FEVER  NAUSEA AND VOMITING THAT IS NOT CONTROLLED WITH YOUR NAUSEA MEDICATION  *UNUSUAL SHORTNESS OF BREATH  *UNUSUAL BRUISING OR BLEEDING  TENDERNESS IN MOUTH AND THROAT WITH OR WITHOUT PRESENCE OF ULCERS  *URINARY PROBLEMS  *BOWEL PROBLEMS  UNUSUAL RASH Items with * indicate a potential emergency and should be followed up as soon as possible.  Feel free to call the clinic you have any questions or concerns. The clinic phone number is [phone].  Please show the [place] at check-in to the Emergency Department and triage nurse.

## 2014-06-08 ENCOUNTER — Ambulatory Visit (HOSPITAL_BASED_OUTPATIENT_CLINIC_OR_DEPARTMENT_OTHER): Payer: Medicare Other

## 2014-06-08 ENCOUNTER — Ambulatory Visit (HOSPITAL_BASED_OUTPATIENT_CLINIC_OR_DEPARTMENT_OTHER): Payer: Medicare Other | Admitting: Nurse Practitioner

## 2014-06-08 VITALS — BP 126/53 | HR 88 | Temp 98.4°F | Resp 20

## 2014-06-08 DIAGNOSIS — C9000 Multiple myeloma not having achieved remission: Secondary | ICD-10-CM

## 2014-06-08 DIAGNOSIS — R112 Nausea with vomiting, unspecified: Secondary | ICD-10-CM

## 2014-06-08 DIAGNOSIS — E860 Dehydration: Secondary | ICD-10-CM

## 2014-06-08 DIAGNOSIS — N289 Disorder of kidney and ureter, unspecified: Secondary | ICD-10-CM

## 2014-06-08 DIAGNOSIS — D649 Anemia, unspecified: Secondary | ICD-10-CM | POA: Diagnosis not present

## 2014-06-08 DIAGNOSIS — Z5112 Encounter for antineoplastic immunotherapy: Secondary | ICD-10-CM

## 2014-06-08 DIAGNOSIS — E8809 Other disorders of plasma-protein metabolism, not elsewhere classified: Secondary | ICD-10-CM

## 2014-06-08 MED ORDER — DEXTROSE 5 % IV SOLN
37.0000 mg/m2 | Freq: Once | INTRAVENOUS | Status: AC
  Administered 2014-06-08: 70 mg via INTRAVENOUS
  Filled 2014-06-08: qty 35

## 2014-06-08 MED ORDER — SODIUM CHLORIDE 0.9 % IV SOLN
Freq: Once | INTRAVENOUS | Status: AC
  Administered 2014-06-08: 11:00:00 via INTRAVENOUS
  Filled 2014-06-08: qty 4

## 2014-06-08 MED ORDER — SODIUM CHLORIDE 0.9 % IV SOLN
Freq: Once | INTRAVENOUS | Status: AC
  Administered 2014-06-08: 11:00:00 via INTRAVENOUS

## 2014-06-08 MED ORDER — HEPARIN SOD (PORK) LOCK FLUSH 100 UNIT/ML IV SOLN
500.0000 [IU] | Freq: Once | INTRAVENOUS | Status: AC | PRN
  Administered 2014-06-08: 500 [IU]
  Filled 2014-06-08: qty 5

## 2014-06-08 MED ORDER — SODIUM CHLORIDE 0.9 % IV SOLN
Freq: Once | INTRAVENOUS | Status: AC
  Administered 2014-06-08: 12:00:00 via INTRAVENOUS

## 2014-06-08 MED ORDER — SODIUM CHLORIDE 0.9 % IJ SOLN
10.0000 mL | INTRAMUSCULAR | Status: DC | PRN
Start: 2014-06-08 — End: 2014-06-08
  Administered 2014-06-08: 10 mL
  Filled 2014-06-08: qty 10

## 2014-06-08 NOTE — Patient Instructions (Signed)
Carfilzomib injection What is this medicine? CARFILZOMIB (kar FILZ oh mib) is a chemotherapy drug that works by slowing or stopping cancer cell growth. This medicine is used to treat multiple myeloma. This medicine may be used for other purposes; ask your health care provider or pharmacist if you have questions. COMMON BRAND NAME(S): KYPROLIS What should I tell my health care provider before I take this medicine? They need to know if you have any of these conditions: -heart disease -irregular heartbeat -liver disease -lung or breathing disease -an unusual or allergic reaction to carfilzomib, or other medicines, foods, dyes, or preservatives -pregnant or trying to get pregnant -breast-feeding How should I use this medicine? This medicine is for injection or infusion into a vein. It is given by a health care professional in a hospital or clinic setting. Talk to your pediatrician regarding the use of this medicine in children. Special care may be needed. Overdosage: If you think you've taken too much of this medicine contact a poison control center or emergency room at once. Overdosage: If you think you have taken too much of this medicine contact a poison control center or emergency room at once. NOTE: This medicine is only for you. Do not share this medicine with others. What if I miss a dose? It is important not to miss your dose. Call your doctor or health care professional if you are unable to keep an appointment. What may interact with this medicine? Interactions are not expected. Give your health care provider a list of all the medicines, herbs, non-prescription drugs, or dietary supplements you use. Also tell them if you smoke, drink alcohol, or use illegal drugs. Some items may interact with your medicine. This list may not describe all possible interactions. Give your health care provider a list of all the medicines, herbs, non-prescription drugs, or dietary supplements you use. Also  tell them if you smoke, drink alcohol, or use illegal drugs. Some items may interact with your medicine. What should I watch for while using this medicine? Your condition will be monitored carefully while you are receiving this medicine. Report any side effects. Continue your course of treatment even though you feel ill unless your doctor tells you to stop. Call your doctor or health care professional for advice if you get a fever, chills or sore throat, or other symptoms of a cold or flu. Do not treat yourself. Try to avoid being around people who are sick. Do not become pregnant while taking this medicine. Women should inform their doctor if they wish to become pregnant or think they might be pregnant. There is a potential for serious side effects to an unborn child. Talk to your health care professional or pharmacist for more information. Do not breast-feed an infant while taking this medicine. Check with your doctor or health care professional if you get an attack of severe diarrhea, nausea and vomiting, or if you sweat a lot. The loss of too much body fluid can make it dangerous for you to take this medicine. You may get dizzy. Do not drive, use machinery, or do anything that needs mental alertness until you know how this medicine affects you. Do not stand or sit up quickly, especially if you are an older patient. This reduces the risk of dizzy or fainting spells. What side effects may I notice from receiving this medicine? Side effects that you should report to your doctor or health care professional as soon as possible: -allergic reactions like skin rash, itching or hives,  swelling of the face, lips, or tongue -breathing problems -chest pain or palpitationschest tightness -cough -dark urine -dizziness -feeling faint or lightheaded -fever or chills -general ill feeling or flu-like symptoms -light-colored stools -palpitations -right upper belly pain -swelling of the legs or ankles -unusual  bleeding or bruising -unusually weak or tired -yellowing of the eyes or skin Side effects that usually do not require medical attention (Report these to your doctor or health care professional if they continue or are bothersome.): -diarrhea -headache -nausea, vomiting -tiredness This list may not describe all possible side effects. Call your doctor for medical advice about side effects. You may report side effects to FDA at 1-800-FDA-1088. Where should I keep my medicine? This drug is given in a hospital or clinic and will not be stored at home. NOTE: This sheet is a summary. It may not cover all possible information. If you have questions about this medicine, talk to your doctor, pharmacist, or health care provider.  2015, [employer] Standard. (2011-06-22 17:02:29)

## 2014-06-08 NOTE — Progress Notes (Signed)
10:20 Patient arrived SOB and nauseated. Pt reports emesis in bathroom prior to infusion room. Vitals are stable. [name] FNP called.  10:50 [name] FNP at bedside for assessment. OK to treat today.

## 2014-06-09 ENCOUNTER — Other Ambulatory Visit: Payer: Self-pay | Admitting: Internal Medicine

## 2014-06-09 ENCOUNTER — Other Ambulatory Visit: Payer: Self-pay | Admitting: *Deleted

## 2014-06-09 LAB — IGG, IGA, IGM
IGG (IMMUNOGLOBIN G), SERUM: 358 mg/dL — AB (ref 690–1700)
IgM, Serum: 8 mg/dL — ABNORMAL LOW (ref 52–322)

## 2014-06-09 LAB — KAPPA/LAMBDA LIGHT CHAINS
KAPPA LAMBDA RATIO: 8.25 — AB (ref 0.26–1.65)
Kappa free light chain: 1.32 mg/dL (ref 0.33–1.94)
LAMBDA FREE LGHT CHN: 0.16 mg/dL — AB (ref 0.57–2.63)

## 2014-06-09 MED ORDER — FERROUS SULFATE 134 MG PO TABS: 1.0000 | ORAL_TABLET | ORAL | Status: DC

## 2014-06-10 ENCOUNTER — Encounter: Payer: Self-pay | Admitting: Nurse Practitioner

## 2014-06-10 NOTE — Assessment & Plan Note (Signed)
Patient presented to the [facility] today to receive cycle 2, day 8 of her Kyprolis/Cytoxan chemotherapy.  Labs essentially normal; with the exception of a hemoglobin of 8.8.  Patient was complaining of nausea; and actually vomited.  When arriving at the [facility] today.  Patient received premedications of both Zofran and dexamethasone 10 mg IV.  All nausea did resolve.   Patient was able to proceed today with her chemotherapy therapy as previously directed.  Patient will return on 06/15/2014 for labs, follow up visit, and her next chemotherapy.

## 2014-06-10 NOTE — Assessment & Plan Note (Addendum)
Patient with a history of chronic renal insufficiency.  Patient in today has increased to 4.3.  Previously creatinine had been 3.6.  Patient does appear mildly dehydrated today.  Patient was encouraged.  Push fluids is much as possible.  After consultation with Dr. Brunilda Payor was cleared to receive her chemotherapy today.  Also, patient has a nephrologist.  Patient states that she has a follow up appointment with her nephrologist next week.

## 2014-06-10 NOTE — Assessment & Plan Note (Signed)
Patient was complaining of nausea today prior to arriving to the [facility]; and actually vomited once while in the infusion center.  Patient was given both Zofran IV and dexamethasone 10 mg IV for nausea.  Patient also confirm that she already has anti-medics at home to take as directed.

## 2014-06-10 NOTE — Assessment & Plan Note (Signed)
Patient was complaining of nausea; and vomited 1 while at the [facility].  Patient does appear mildly dehydrated today.  Patient will receive IV fluid rehydration while at the cancer Center today.  She was also encouraged to push fluids at home.

## 2014-06-10 NOTE — Progress Notes (Signed)
SYMPTOM MANAGEMENT CLINIC   HPI: Nancy Norris 74 y.o. female diagnosed with multiple myeloma.  Currently undergoing Kyprolis/Cytoxan.  Chemotherapy therapy regimen.  Patient presented to the [facility] today to receive cycle 2, day 8 of her chemotherapy.  She has been complaining of some chronic nausea; actually vomited once while at the [facility].  Patient was given both Zofran and dexamethasone IV; and no further vomiting noted.  Patient was cleared per Dr. Julien Nordmann to proceed with chemotherapy today.   HPI  ROS  Past Medical History  Diagnosis Date  . Hyperkalemia   . Hypothyroidism   . COPD (chronic obstructive pulmonary disease)   . Hyponatremia   . Dizziness   . Fibromyalgia   . Breast cancer   . Multiple myeloma   . Mucositis   . Hx of echocardiogram     a.  Echocardiogram (12/26/2012): EF 10-93%, grade 1 diastolic dysfunction;   b.  Echocardiogram (02/2013): EF 55-60%, no WMA, trivial effusion  . Fibromyalgia   . Arthritis   . Hx of cardiovascular stress test     LexiScan with low level exercise Myoview (02/2013): No ischemia, EF 72%; normal study  . Complication of anesthesia   . PONV (postoperative nausea and vomiting) 2008    after mastestomy  . Myocardial infarction     in past, patient was unaware.   . Anginal pain     used NTG x 2 May 31 and 06/15/13   . GERD (gastroesophageal reflux disease)   . Headache(784.0)   . Anxiety   . Depression   . CKD (chronic kidney disease) stage 3, GFR 30-59 ml/min   . Anemia   . History of blood transfusion     last one May 12   . Neuropathy     Past Surgical History  Procedure Laterality Date  . History of port removal    . Status post stem cell transplant on September 28, 2008.    [facility] Abdominal hysterectomy  1981  . Cholecystectomy  1971  . Mastectomy Left 2008  . Cataract extraction, bilateral    . Breast surgery    . Eye surgery Bilateral     lens implant  . Breast reconstruction    . Portacath  placement  12/2012    has had 2  . Av fistula placement Left 06/19/2013    Procedure: CREATION OF LEFT ARM ARTERIOVENOUS (AV) FISTULA ;  Surgeon: Angelia Mould, MD;  Location: [facility];  Service: Vascular;  Laterality: Left;    has Hypercalcemia; HX: breast cancer; Asthma, chronic; Hypothyroid; Chronic diastolic heart failure; HTN (hypertension); GERD (gastroesophageal reflux disease); COPD (chronic obstructive pulmonary disease) with emphysema; Fibromyalgia; Mixed hyperlipidemia; Prediabetes; Vitamin D deficiency; Medication management; Depression, controlled; Multiple myeloma; Fatigue; Orthostatic hypotension; CKD (chronic kidney disease) stage 5, GFR less than 15 ml/min; Anemia; Nausea with vomiting; Renal insufficiency; Dehydration; and Hypoalbuminemia on her problem list.    is allergic to codeine; latex; other; onion; adhesive; iodinated diagnostic agents; and sulfa antibiotics.    Medication List       This list is accurate as of: 06/08/14 11:59 PM.  Always use your most recent med list.               acetaminophen 325 MG tablet  Commonly known as:  TYLENOL  Take 650 mg by mouth every 6 (six) hours as needed for mild pain or moderate pain.     albuterol 108 (90 BASE) MCG/ACT inhaler  Commonly known as:  PROVENTIL HFA;VENTOLIN HFA  Inhale 2 puffs into the lungs 2 (two) times daily as needed for wheezing or shortness of breath.     ascorbic acid 250 MG Chew  Commonly known as:  VITAMIN C  Chew 250 mg by mouth daily.     aspirin EC 81 MG tablet  Take 1 tablet (81 mg total) by mouth daily.     buPROPion 300 MG 24 hr tablet  Commonly known as:  WELLBUTRIN XL  Take 300 mg by mouth daily.     carboxymethylcellulose 0.5 % Soln  Commonly known as:  REFRESH PLUS  Place 1 drop into both eyes every 2 (two) hours.     Cholecalciferol 4000 UNITS Tabs  Take 4,000 Units by mouth daily.     citalopram 40 MG tablet  Commonly known as:  CELEXA  TAKE 1 TABLET BY MOUTH ONCE DAILY.      cromolyn 4 % ophthalmic solution  Commonly known as:  OPTICROM  Place 1 drop into both eyes 4 (four) times daily.     dexamethasone 4 MG tablet  Commonly known as:  DECADRON  Take 10 tablets by mouth once a week as directed     esomeprazole 40 MG capsule  Commonly known as:  NEXIUM  TAKE 1 CAPSULE BY MOUTH DAILY, EACH MORNING FOR ACID INDIGESTION     Ferrous Sulfate 134 MG Tabs  Take 1 tablet by mouth every other day.     fludrocortisone 0.1 MG tablet  Commonly known as:  FLORINEF  TAKE 1 TO 2 TABLETS DAILY AS DIRECTED FOR LOW BLOOD PRESSURE.     fluticasone 50 MCG/ACT nasal spray  Commonly known as:  FLONASE  Place 2 sprays into both nostrils at bedtime.     gabapentin 300 MG capsule  Commonly known as:  NEURONTIN  Take 1 capsule (300 mg total) by mouth at bedtime.     isosorbide mononitrate 30 MG 24 hr tablet  Commonly known as:  IMDUR  TAKE 1 TABLET BY MOUTH DAILY.     levothyroxine 150 MCG tablet  Commonly known as:  SYNTHROID, LEVOTHROID  TAKE 1 TABLET BY MOUTH ONCE DAILY.     loratadine 10 MG tablet  Commonly known as:  CLARITIN  Take 10 mg by mouth daily.     loteprednol 0.2 % Susp  Commonly known as:  ALREX  Place 1 drop into both eyes 4 (four) times daily.     meclizine 32 MG tablet  Commonly known as:  ANTIVERT  Take 1 tablet (32 mg total) by mouth 3 (three) times daily as needed.     midodrine 10 MG tablet  Commonly known as:  PROAMATINE  Take 1 tablet (10 mg total) by mouth 3 (three) times daily.     nitroGLYCERIN 0.4 MG SL tablet  Commonly known as:  NITROSTAT  Place 1 tablet (0.4 mg total) under the tongue every 5 (five) minutes as needed for chest pain.     nystatin 100000 UNIT/GM Powd  Apply 1 g topically 2 (two) times daily. Apply to belly     ondansetron 8 MG disintegrating tablet  Commonly known as:  ZOFRAN-ODT  Take 1 tablet (8 mg total) by mouth every 8 (eight) hours as needed for nausea or vomiting.     polyethylene glycol  packet  Commonly known as:  MIRALAX / GLYCOLAX  Take 17 g by mouth daily.     prochlorperazine 10 MG tablet  Commonly known as:  COMPAZINE  TAKE 1  TABLET BY MOUTH EVERY 6 HOURS AS NEEDED FOR NAUSEA OR VOMITING.     ranitidine 300 MG tablet  Commonly known as:  ZANTAC  TAKE 1 TABLET BY MOUTH AT BEDTIME, FOR ACID REFLUX AND INDIGESTION.     RESTASIS 0.05 % ophthalmic emulsion  Generic drug:  cycloSPORINE  Place 1 drop into both eyes 2 (two) times daily.     zolpidem 5 MG tablet  Commonly known as:  AMBIEN  TAKE 1 TABLET BY MOUTH AT BEDTIME AS NEEDED FOR SLEEP.         PHYSICAL EXAMINATION  Oncology Vitals 06/08/2014 06/07/2014 06/01/2014 05/20/2014 05/20/2014 05/20/2014 05/18/2014  Height - - 165 cm - - - -  Weight - - 75.66 kg - - - -  Weight (lbs) - - 166 lbs 13 oz - - - -  BMI (kg/m2) - - 27.76 kg/m2 - - - -  Temp 98.4 98 98.2 98 98.6 98.7 98.4  Pulse 88 72 82 67 72 76 81  Resp _0 Resp (Historical as of 08/16/11) - - - - - - -  SpO2 - 93 98 92 95 - 100  BSA (m2) - - 1.86 m2 - - - -   BP Readings from Last 3 Encounters:  06/08/14 126/53  06/07/14 121/52  06/01/14 113/52    Physical Exam  Constitutional: She is oriented to person, place, and time and well-developed, well-nourished, and in no distress.  HENT:  Head: Normocephalic and atraumatic.  Mouth/Throat: Oropharynx is clear and moist.  Eyes: Conjunctivae and EOM are normal. Pupils are equal, round, and reactive to light. Right eye exhibits no discharge. Left eye exhibits no discharge. No scleral icterus.  Neck: Normal range of motion.  Pulmonary/Chest: Effort normal. No respiratory distress.  Musculoskeletal: Normal range of motion.  Neurological: She is alert and oriented to person, place, and time.  Skin: Skin is warm and dry. No rash noted. No erythema. No pallor.  Psychiatric: Affect normal.  Nursing note and vitals reviewed.   LABORATORY DATA:. Appointment on 06/07/2014  Component Date Value  Ref Range Status  . WBC 06/07/2014 7.8  3.9 - 10.3 10e3/uL Final  . NEUT# 06/07/2014 4.2  1.5 - 6.5 10e3/uL Final  . HGB 06/07/2014 8.8* 11.6 - 15.9 g/dL Final  . HCT 06/07/2014 27.2* 34.8 - 46.6 % Final  . Platelets 06/07/2014 95* 145 - 400 10e3/uL Final  . MCV 06/07/2014 100.4  79.5 - 101.0 fL Final  . MCH 06/07/2014 32.5  25.1 - 34.0 pg Final  . MCHC 06/07/2014 32.4  31.5 - 36.0 g/dL Final  . RBC 06/07/2014 2.71* 3.70 - 5.45 10e6/uL Final  . RDW 06/07/2014 14.3  11.2 - 14.5 % Final  . lymph# 06/07/2014 0.8* 0.9 - 3.3 10e3/uL Final  . MONO# 06/07/2014 1.3* 0.1 - 0.9 10e3/uL Final  . Eosinophils Absolute 06/07/2014 1.6* 0.0 - 0.5 10e3/uL Final  . Basophils Absolute 06/07/2014 0.0  0.0 - 0.1 10e3/uL Final  . NEUT% 06/07/2014 53.1  38.4 - 76.8 % Final  . LYMPH% 06/07/2014 10.5* 14.0 - 49.7 % Final  . MONO% 06/07/2014 15.9* 0.0 - 14.0 % Final  . EOS% 06/07/2014 20.4* 0.0 - 7.0 % Final  . BASO% 06/07/2014 0.1  0.0 - 2.0 % Final  . nRBC 06/07/2014 0  0 - 0 % Final  . Kappa free light chain 06/07/2014 1.32  0.33 - 1.94 mg/dL Final  . Lambda Free Lght Chn 06/07/2014 0.16* 0.57 -  2.63 mg/dL Final  . Kappa:Lambda Ratio 06/07/2014 8.25* 0.26 - 1.65 Final  . IgG (Immunoglobin G), Serum 06/07/2014 358* 690 - 1700 mg/dL Final  . IgA 06/07/2014 <6* 69 - 380 mg/dL Final  . IgM, Serum 06/07/2014 8* 52 - 322 mg/dL Final  . Sodium 06/07/2014 144  136 - 145 mEq/L Final  . Potassium 06/07/2014 4.0  3.5 - 5.1 mEq/L Final  . Chloride 06/07/2014 109  98 - 109 mEq/L Final  . CO2 06/07/2014 20* 22 - 29 mEq/L Final  . Glucose 06/07/2014 100  70 - 140 mg/dl Final  . BUN 06/07/2014 46.8* 7.0 - 26.0 mg/dL Final  . Creatinine 06/07/2014 4.3* 0.6 - 1.1 mg/dL Final  . Total Bilirubin 06/07/2014 0.61  0.20 - 1.20 mg/dL Final  . Alkaline Phosphatase 06/07/2014 77  40 - 150 U/L Final  . AST 06/07/2014 15  5 - 34 U/L Final  . ALT 06/07/2014 15  0 - 55 U/L Final  . Total Protein 06/07/2014 5.2* 6.4 - 8.3 g/dL  Final  . Albumin 06/07/2014 3.0* 3.5 - 5.0 g/dL Final  . Calcium 06/07/2014 8.9  8.4 - 10.4 mg/dL Final  . Anion Gap 06/07/2014 14* 3 - 11 mEq/L Final  . EGFR 06/07/2014 10* >90 ml/min/1.73 m2 Final   eGFR is calculated using the CKD-EPI Creatinine Equation (2009)     RADIOGRAPHIC STUDIES: No results found.  ASSESSMENT/PLAN:    Multiple myeloma Patient presented to the [facility] today to receive cycle 2, day 8 of her Kyprolis/Cytoxan chemotherapy.  Labs essentially normal; with the exception of a hemoglobin of 8.8.  Patient was complaining of nausea; and actually vomited.  When arriving at the [facility] today.  Patient received premedications of both Zofran and dexamethasone 10 mg IV.  All nausea did resolve.   Patient was able to proceed today with her chemotherapy therapy as previously directed.  Patient will return on 06/15/2014 for labs, follow up visit, and her next chemotherapy.   Anemia Hgb down to 8.8.  Patient denies any worsening issues with either fatigue or shortness of breath with exertion.  Patient will proceed today with chemotherapy as previously directed.   Nausea with vomiting Patient was complaining of nausea today prior to arriving to the [facility]; and actually vomited once while in the infusion center.  Patient was given both Zofran IV and dexamethasone 10 mg IV for nausea.  Patient also confirm that she already has anti-medics at home to take as directed.   Renal insufficiency Patient with a history of chronic renal insufficiency.  Patient in today has increased to 4.3.  Previously creatinine had been 3.6.  Patient does appear mildly dehydrated today.  Patient was encouraged.  Push fluids is much as possible.  After consultation with Dr. Brunilda Payor was cleared to receive her chemotherapy today.  Also, patient has a nephrologist.  Patient states that she has a follow up appointment with her nephrologist next week.   Dehydration Patient  was complaining of nausea; and vomited 1 while at the [facility].  Patient does appear mildly dehydrated today.  Patient will receive IV fluid rehydration while at the cancer Center today.  She was also encouraged to push fluids at home.    Patient stated understanding of all instructions; and was in agreement with this plan of care. The patient knows to call the clinic with any problems, questions or concerns.   Review/collaboration with Dr. Julien Nordmann regarding all aspects of patient's visit today.   Total  time spent with patient was 25 minutes;  with greater than 75 percent of that time spent in face to face counseling regarding patient's symptoms,  and coordination of care and follow up.  Disclaimer: This note was dictated with voice recognition software. Similar sounding words can inadvertently be transcribed and may not be corrected upon review.   Drue Second, NP 06/10/2014

## 2014-06-10 NOTE — Assessment & Plan Note (Addendum)
Hgb down to 8.8.  Patient denies any worsening issues with either fatigue or shortness of breath with exertion.  Patient will proceed today with chemotherapy as previously directed.

## 2014-06-11 ENCOUNTER — Telehealth: Payer: Self-pay | Admitting: *Deleted

## 2014-06-11 NOTE — Telephone Encounter (Signed)
TC to pt to follow up nausea and vomiting prior to treatment 5/24- she is not doing well today. She is still having issues with nausea "air bubbles inside me that are killing me". Joint pain "every hinge hurts", no appetite, thick film in mouth effecting the taste of her food making her not want to eat anything. She is very frustrated she does not understand all of her appointments and what is going on. Advised pt that I would forward this message to the nurse navigator to assist [place] in helping pt through this process. Suggested an appt with our nutritionist and pt agrees. LM for Dory Peru- nutritionist.

## 2014-06-15 ENCOUNTER — Ambulatory Visit (HOSPITAL_COMMUNITY)
Admission: RE | Admit: 2014-06-15 | Discharge: 2014-06-15 | Disposition: A | Discharge status: Home / Self Care | Payer: Medicare Other | Source: Ambulatory Visit | Attending: Internal Medicine | Admitting: Internal Medicine

## 2014-06-15 ENCOUNTER — Ambulatory Visit (HOSPITAL_BASED_OUTPATIENT_CLINIC_OR_DEPARTMENT_OTHER): Payer: Medicare Other | Admitting: Oncology

## 2014-06-15 ENCOUNTER — Telehealth: Payer: Self-pay | Admitting: Oncology

## 2014-06-15 ENCOUNTER — Telehealth: Payer: Self-pay | Admitting: *Deleted

## 2014-06-15 ENCOUNTER — Encounter: Payer: Self-pay | Admitting: Oncology

## 2014-06-15 ENCOUNTER — Ambulatory Visit (HOSPITAL_BASED_OUTPATIENT_CLINIC_OR_DEPARTMENT_OTHER): Payer: Medicare Other

## 2014-06-15 ENCOUNTER — Other Ambulatory Visit: Payer: Medicare Other

## 2014-06-15 ENCOUNTER — Other Ambulatory Visit (HOSPITAL_BASED_OUTPATIENT_CLINIC_OR_DEPARTMENT_OTHER): Payer: Medicare Other

## 2014-06-15 VITALS — BP 109/50 | HR 75 | Temp 98.2°F | Resp 18 | Ht 65.0 in | Wt 174.5 lb

## 2014-06-15 DIAGNOSIS — C9002 Multiple myeloma in relapse: Secondary | ICD-10-CM

## 2014-06-15 DIAGNOSIS — C9000 Multiple myeloma not having achieved remission: Secondary | ICD-10-CM

## 2014-06-15 DIAGNOSIS — Z5111 Encounter for antineoplastic chemotherapy: Secondary | ICD-10-CM | POA: Diagnosis not present

## 2014-06-15 DIAGNOSIS — Z5112 Encounter for antineoplastic immunotherapy: Secondary | ICD-10-CM | POA: Diagnosis not present

## 2014-06-15 DIAGNOSIS — D649 Anemia, unspecified: Secondary | ICD-10-CM

## 2014-06-15 LAB — CBC WITH DIFFERENTIAL/PLATELET
BASO%: 2.1 % — ABNORMAL HIGH (ref 0.0–2.0)
BASOS ABS: 0.1 10*3/uL (ref 0.0–0.1)
EOS%: 12.4 % — ABNORMAL HIGH (ref 0.0–7.0)
Eosinophils Absolute: 0.5 10*3/uL (ref 0.0–0.5)
HCT: 22.3 % — ABNORMAL LOW (ref 34.8–46.6)
HEMOGLOBIN: 7.2 g/dL — AB (ref 11.6–15.9)
LYMPH%: 17.4 % (ref 14.0–49.7)
MCH: 33.6 pg (ref 25.1–34.0)
MCHC: 32.6 g/dL (ref 31.5–36.0)
MCV: 103.1 fL — AB (ref 79.5–101.0)
MONO#: 0.7 10*3/uL (ref 0.1–0.9)
MONO%: 18.9 % — AB (ref 0.0–14.0)
NEUT#: 1.8 10*3/uL (ref 1.5–6.5)
NEUT%: 49.2 % (ref 38.4–76.8)
Platelets: 94 10*3/uL — ABNORMAL LOW (ref 145–400)
RBC: 2.16 10*6/uL — ABNORMAL LOW (ref 3.70–5.45)
RDW: 14.3 % (ref 11.2–14.5)
WBC: 3.7 10*3/uL — ABNORMAL LOW (ref 3.9–10.3)
lymph#: 0.6 10*3/uL — ABNORMAL LOW (ref 0.9–3.3)

## 2014-06-15 LAB — PREPARE RBC (CROSSMATCH)

## 2014-06-15 LAB — COMPREHENSIVE METABOLIC PANEL (CC13)
ALK PHOS: 111 U/L (ref 40–150)
ALT: 13 U/L (ref 0–55)
AST: 12 U/L (ref 5–34)
Albumin: 3 g/dL — ABNORMAL LOW (ref 3.5–5.0)
Anion Gap: 8 mEq/L (ref 3–11)
BILIRUBIN TOTAL: 0.82 mg/dL (ref 0.20–1.20)
BUN: 26.6 mg/dL — ABNORMAL HIGH (ref 7.0–26.0)
CHLORIDE: 111 meq/L — AB (ref 98–109)
CO2: 23 mEq/L (ref 22–29)
CREATININE: 3.5 mg/dL — AB (ref 0.6–1.1)
Calcium: 8.5 mg/dL (ref 8.4–10.4)
EGFR: 12 mL/min/{1.73_m2} — ABNORMAL LOW (ref >90)
GLUCOSE: 118 mg/dL (ref 70–140)
Potassium: 4.5 mEq/L (ref 3.5–5.1)
SODIUM: 142 meq/L (ref 136–145)
Total Protein: 5.2 g/dL — ABNORMAL LOW (ref 6.4–8.3)

## 2014-06-15 LAB — HOLD TUBE, BLOOD BANK

## 2014-06-15 MED ORDER — HEPARIN SOD (PORK) LOCK FLUSH 100 UNIT/ML IV SOLN
500.0000 [IU] | Freq: Once | INTRAVENOUS | Status: AC | PRN
  Administered 2014-06-15: 500 [IU]
  Filled 2014-06-15: qty 5

## 2014-06-15 MED ORDER — SODIUM CHLORIDE 0.9 % IV SOLN
Freq: Once | INTRAVENOUS | Status: AC
  Administered 2014-06-15: 13:00:00 via INTRAVENOUS

## 2014-06-15 MED ORDER — CARFILZOMIB CHEMO INJECTION 60 MG
37.0000 mg/m2 | Freq: Once | INTRAVENOUS | Status: AC
  Administered 2014-06-15: 70 mg via INTRAVENOUS
  Filled 2014-06-15: qty 35

## 2014-06-15 MED ORDER — SODIUM CHLORIDE 0.9 % IV SOLN
300.0000 mg/m2 | Freq: Once | INTRAVENOUS | Status: AC
  Administered 2014-06-15: 560 mg via INTRAVENOUS
  Filled 2014-06-15: qty 28

## 2014-06-15 MED ORDER — SODIUM CHLORIDE 0.9 % IV SOLN
Freq: Once | INTRAVENOUS | Status: AC
  Administered 2014-06-15: 13:00:00 via INTRAVENOUS
  Filled 2014-06-15: qty 4

## 2014-06-15 MED ORDER — SODIUM CHLORIDE 0.9 % IJ SOLN
10.0000 mL | INTRAMUSCULAR | Status: DC | PRN
  Administered 2014-06-15: 10 mL
  Filled 2014-06-15: qty 10

## 2014-06-15 MED ORDER — SODIUM CHLORIDE 0.9 % IV SOLN
Freq: Once | INTRAVENOUS | Status: AC
  Administered 2014-06-15: 14:00:00 via INTRAVENOUS

## 2014-06-15 NOTE — Progress Notes (Signed)
[place]  Telephone:([phone]) [phone] Fax:([phone]) [phone]  OFFICE PROGRESS NOTE   PRINCIPAL DIAGNOSES:  1. Recurrent multiple myeloma initially diagnosed in 2002, initially with smoldering myeloma at [facility].  2. Ductal carcinoma in situ status post mastectomy with sentinel lymph node biopsy in October 2008.  PRIOR THERAPY:  1. Status post treatment with tamoxifen from November 2008 through February 2009, discontinued secondary to intolerance. 2. Status post 3 cycles of chemotherapy with Revlimid and Decadron followed by 1 cycle of Decadron only with mild response. 3. Status post 2 cycles of systemic chemotherapy with Velcade, Doxil and Decadron discontinued secondary to significant peripheral neuropathy. Last dose was given May 2010 at [facility]. 4. Status post autologous peripheral blood stem cell transplant on October 01, 2008 at [facility] under the care of Dr. Phyllis Ginger.  5. Systemic chemotherapy with Carfilzomib initially was 20 mg/M2 and will be increased after cycle #1 to 36 mg/M2 on days 1, 2, 8, 9, 15 and 16 every 4 weeks in addition to Cytoxan 300 mg/M2 and Decadron 40 mg by mouth weekly basis, status post 4 cycles. First cycle on 12/29/2012.    CURRENT THERAPY: She resumed chemotherapy with Carfilzomib, Cytoxan, and Decadron on 05/03/2014. Status post 1 cycle  INTERVAL HISTORY: Nancy Norris 74 y.o. female returns to the clinic today for followup visit. Having more fatigue and dyspnea.Reports nausea without vomiting. Antiemetics help some. No fever or chills. The patient denied having any chest pain, shortness of breath, cough or hemoptysis. Denies bleeding. No significant weight loss or night sweats. Her neuropathy is at baseline. She reports some trouble swallowing, but this is unchanged. Reports increased edema. The patient is due for cycle 2 day 15 of her chemotherapy today.  MEDICAL HISTORY: Past Medical History    Diagnosis Date  . Hyperkalemia   . Hypothyroidism   . COPD (chronic obstructive pulmonary disease)   . Hyponatremia   . Dizziness   . Fibromyalgia   . Breast cancer   . Multiple myeloma   . Mucositis   . Hx of echocardiogram     a.  Echocardiogram (12/26/2012): EF 33-29%, grade 1 diastolic dysfunction;   b.  Echocardiogram (02/2013): EF 55-60%, no WMA, trivial effusion  . Fibromyalgia   . Arthritis   . Hx of cardiovascular stress test     LexiScan with low level exercise Myoview (02/2013): No ischemia, EF 72%; normal study  . Complication of anesthesia   . PONV (postoperative nausea and vomiting) 2008    after mastestomy  . Myocardial infarction     in past, patient was unaware.   . Anginal pain     used NTG x 2 May 31 and 06/15/13   . GERD (gastroesophageal reflux disease)   . Headache(784.0)   . Anxiety   . Depression   . CKD (chronic kidney disease) stage 3, GFR 30-59 ml/min   . Anemia   . History of blood transfusion     last one May 12   . Neuropathy     ALLERGIES:  is allergic to codeine; latex; other; onion; adhesive; iodinated diagnostic agents; and sulfa antibiotics.  MEDICATIONS:  Current Outpatient Prescriptions  Medication Sig Dispense Refill  . acetaminophen (TYLENOL) 325 MG tablet Take 650 mg by mouth every 6 (six) hours as needed for mild pain or moderate pain.     Marland Kitchen albuterol (PROVENTIL HFA;VENTOLIN HFA) 108 (90 BASE) MCG/ACT inhaler Inhale 2 puffs into the lungs 2 (two) times daily as  needed for wheezing or shortness of breath. 1 each 6  . ascorbic acid (VITAMIN C) 250 MG CHEW Chew 250 mg by mouth daily.    Marland Kitchen aspirin EC 81 MG tablet Take 1 tablet (81 mg total) by mouth daily.    Marland Kitchen buPROPion (WELLBUTRIN XL) 300 MG 24 hr tablet Take 300 mg by mouth daily.   98  . carboxymethylcellulose (REFRESH PLUS) 0.5 % SOLN Place 1 drop into both eyes every 2 (two) hours.    . Cholecalciferol 4000 UNITS TABS Take 4,000 Units by mouth daily.    . citalopram (CELEXA) 40 MG  tablet TAKE 1 TABLET BY MOUTH ONCE DAILY. 90 tablet 1  . cromolyn (OPTICROM) 4 % ophthalmic solution Place 1 drop into both eyes 4 (four) times daily.    Marland Kitchen dexamethasone (DECADRON) 4 MG tablet Take 10 tablets by mouth once a week as directed 40 tablet 3  . esomeprazole (NEXIUM) 40 MG capsule TAKE 1 CAPSULE BY MOUTH DAILY, EACH MORNING FOR ACID INDIGESTION 30 capsule PRN  . Ferrous Sulfate 134 MG TABS Take 1 tablet (134 mg total) by mouth every other day. 60 tablet 2  . fludrocortisone (FLORINEF) 0.1 MG tablet TAKE 1 TO 2 TABLETS DAILY AS DIRECTED FOR LOW BLOOD PRESSURE. 60 tablet 1  . fluticasone (FLONASE) 50 MCG/ACT nasal spray Place 2 sprays into both nostrils at bedtime.     . gabapentin (NEURONTIN) 300 MG capsule Take 1 capsule (300 mg total) by mouth at bedtime. 90 capsule 3  . isosorbide mononitrate (IMDUR) 30 MG 24 hr tablet TAKE 1 TABLET BY MOUTH DAILY. 30 tablet 11  . levothyroxine (SYNTHROID, LEVOTHROID) 150 MCG tablet TAKE 1 TABLET BY MOUTH ONCE DAILY. 90 tablet 1  . loratadine (CLARITIN) 10 MG tablet Take 10 mg by mouth daily.    Marland Kitchen loteprednol (ALREX) 0.2 % SUSP Place 1 drop into both eyes 4 (four) times daily. 1 Bottle PRN  . meclizine (ANTIVERT) 32 MG tablet Take 1 tablet (32 mg total) by mouth 3 (three) times daily as needed. 30 tablet 0  . midodrine (PROAMATINE) 10 MG tablet Take 1 tablet (10 mg total) by mouth 3 (three) times daily. 90 tablet 11  . nitroGLYCERIN (NITROSTAT) 0.4 MG SL tablet Place 1 tablet (0.4 mg total) under the tongue every 5 (five) minutes as needed for chest pain. 25 tablet 3  . nystatin (MYCOSTATIN/NYSTOP) 100000 UNIT/GM POWD Apply 1 g topically 2 (two) times daily. Apply to belly    . ondansetron (ZOFRAN-ODT) 8 MG disintegrating tablet Take 1 tablet (8 mg total) by mouth every 8 (eight) hours as needed for nausea or vomiting. 20 tablet 3  . polyethylene glycol (MIRALAX / GLYCOLAX) packet Take 17 g by mouth daily.    . prochlorperazine (COMPAZINE) 10 MG tablet  TAKE 1 TABLET BY MOUTH EVERY 6 HOURS AS NEEDED FOR NAUSEA OR VOMITING. 60 tablet 0  . ranitidine (ZANTAC) 300 MG tablet TAKE 1 TABLET BY MOUTH AT BEDTIME, FOR ACID REFLUX AND INDIGESTION. 30 tablet PRN  . RESTASIS 0.05 % ophthalmic emulsion Place 1 drop into both eyes 2 (two) times daily.   4  . zolpidem (AMBIEN) 5 MG tablet TAKE 1 TABLET BY MOUTH AT BEDTIME AS NEEDED FOR SLEEP. 30 tablet 0   No current facility-administered medications for this visit.   Facility-Administered Medications Ordered in Other Visits  Medication Dose Route Frequency Provider Last Rate Last Dose  . 0.9 %  sodium chloride infusion   Intravenous Once Adrena E  Johnson, PA-C      . sodium chloride 0.9 % injection 10 mL  10 mL Intracatheter PRN Curt Bears, MD   10 mL at 01/05/13 1825    SURGICAL HISTORY:  Past Surgical History  Procedure Laterality Date  . History of port removal    . Status post stem cell transplant on September 28, 2008.    Marland Kitchen Abdominal hysterectomy  1981  . Cholecystectomy  1971  . Mastectomy Left 2008  . Cataract extraction, bilateral    . Breast surgery    . Eye surgery Bilateral     lens implant  . Breast reconstruction    . Portacath placement  12/2012    has had 2  . Av fistula placement Left 06/19/2013    Procedure: CREATION OF LEFT ARM ARTERIOVENOUS (AV) FISTULA ;  Surgeon: Angelia Mould, MD;  Location: [facility] OR;  Service: Vascular;  Laterality: Left;    REVIEW OF SYSTEMS:  A comprehensive ROS was negative except as noted in HPI.   PHYSICAL EXAMINATION: General appearance: alert, cooperative and no distress Head: Normocephalic, without obvious abnormality, atraumatic Neck: no adenopathy Lymph nodes: Cervical, supraclavicular, and axillary nodes normal. Resp: clear to auscultation bilaterally Back: symmetric, no curvature. ROM normal. No CVA tenderness. Cardio: regular rate and rhythm, S1, S2 normal, no murmur, click, rub or gallop GI: soft, non-tender; bowel sounds  normal; no masses,  no organomegaly Extremities: edema 1+ in the bilateral lower extremities. Neurologic: Alert and oriented X 3, normal strength and tone. Normal symmetric reflexes. Normal coordination and gait  ECOG PERFORMANCE STATUS: 1 - Symptomatic but completely ambulatory  Blood pressure 109/50, pulse 75, temperature 98.2 F (36.8 C), temperature source Oral, resp. rate 18, height _0  (1.651 m), weight 174 lb 8 oz (79.153 kg), SpO2 99 %.  LABORATORY DATA: Lab Results  Component Value Date   WBC 3.7* 06/15/2014   HGB 7.2* 06/15/2014   HCT 22.3* 06/15/2014   MCV 103.1* 06/15/2014   PLT 94* 06/15/2014      Chemistry      Component Value Date/Time   NA 144 06/07/2014 0854   NA 138 04/03/2014 0306   K 4.0 06/07/2014 0854   K 3.3* 04/03/2014 0306   CL 110 04/03/2014 0306   CL 105 03/19/2012 0811   CO2 20* 06/07/2014 0854   CO2 22 04/03/2014 0306   BUN 46.8* 06/07/2014 0854   BUN 33* 04/03/2014 0306   CREATININE 4.3* 06/07/2014 0854   CREATININE 4.29* 04/03/2014 0306   CREATININE 3.87* 03/23/2014 1558      Component Value Date/Time   CALCIUM 8.9 06/07/2014 0854   CALCIUM 8.3* 04/03/2014 0306   ALKPHOS 77 06/07/2014 0854   ALKPHOS 71 04/03/2014 0306   AST 15 06/07/2014 0854   AST 20 04/03/2014 0306   ALT 15 06/07/2014 0854   ALT 26 04/03/2014 0306   BILITOT 0.61 06/07/2014 0854   BILITOT 0.7 04/03/2014 0306     Other lab results: Beta-2 microglobulin 9.07, free kappa light chain 1.32, free lambda light chain 0.16, kappa/lambda ratio 8.25. IgG 358 IgA <6 and IgM 8    RADIOGRAPHIC STUDIES:  ASSESSMENT AND PLAN: This is a very pleasant 74 year old white female with relapsed multiple myeloma. She is currently undergoing systemic chemotherapy with Carfilzomib, Cytoxan and dexamethasone status post 4 cycles. She was placed on observation but after consulting with her nephrologist she resumed treatment for her multiple myeloma.   Patient seen and examined with Dr.  Julien Nordmann. Labs  reviewed. She has had a significant improvement in her myeloma labs. Since the patient is having more fatigue and other side effects from chemo, she was given the option of stopping vs. Continuing chemo. She has opted to continue. She will proceed with cycle 2 day 15 today.   She is anemic and symptomatic today. Will set her up for 2 units PRBCs this week.   She was advised to discuss her increased edema with Nephrology.  She is scheduled back in 2 weeks prior to the beginning of cycle 3 of her chemotherapy.  She was advised to call immediately if she has any concerning symptoms in the interval.   Mikey Bussing, NP 06/15/2014  ADDENDUM: Hematology/Oncology Attending: I had a face to face encounter with the patient. I recommended her care plan. This is a very pleasant 74 years old white female with multiple myeloma currently undergoing systemic chemotherapy with Carfilzomib, Cytoxan and dexamethasone status post 1 cycle and she is currently undergoing cycle #2. She is feeling fine and tolerating her treatment fairly well was no significant adverse effects except for fatigue secondary to chemotherapy-induced anemia as well as anemia of chronic disease. Her recent myeloma panel showed improvement in her disease. I discussed the lab result with the patient today. I recommended for the patient to continue her treatment with chemotherapy today as a scheduled. We will arrange for the patient to receive 2 units of PRBCs transfusion later this week.  She was advised to call immediately if she has any concerning symptoms in the interval.  Disclaimer: This note was dictated with voice recognition software. Similar sounding words can inadvertently be transcribed and may be missed upon review. [name]., MD 06/21/2014

## 2014-06-15 NOTE — Patient Instructions (Signed)
[place] Discharge Instructions for Patients Receiving Chemotherapy  Today you received the following chemotherapy agents KYPROLIS, CYTOXAN  To help prevent nausea and vomiting after your treatment, we encourage you to take your nausea medication if needed.   If you develop nausea and vomiting that is not controlled by your nausea medication, call the clinic.   BELOW ARE SYMPTOMS THAT SHOULD BE REPORTED IMMEDIATELY:  *FEVER GREATER THAN 100.5 F  *CHILLS WITH OR WITHOUT FEVER  NAUSEA AND VOMITING THAT IS NOT CONTROLLED WITH YOUR NAUSEA MEDICATION  *UNUSUAL SHORTNESS OF BREATH  *UNUSUAL BRUISING OR BLEEDING  TENDERNESS IN MOUTH AND THROAT WITH OR WITHOUT PRESENCE OF ULCERS  *URINARY PROBLEMS  *BOWEL PROBLEMS  UNUSUAL RASH Items with * indicate a potential emergency and should be followed up as soon as possible.  Feel free to call the clinic you have any questions or concerns. The clinic phone number is [phone].  Please show the [place] at check-in to the Emergency Department and triage nurse.

## 2014-06-15 NOTE — Telephone Encounter (Signed)
Per staff message and POF I have scheduled appts. Advised scheduler of appts. JMW  

## 2014-06-15 NOTE — Telephone Encounter (Signed)
Pt confirmed labs/ov per 05/31 POF, gave pt AVS and Calendar..... KJ, sent msg to add blood

## 2014-06-15 NOTE — Progress Notes (Unsigned)
Ok to tx w/hgb 7.2. Pt is being crossmatched for blood transfusion. Was treated with platelet of 95 and creat of 4.3 on 5/23.

## 2014-06-16 ENCOUNTER — Ambulatory Visit (HOSPITAL_COMMUNITY)
Admission: RE | Admit: 2014-06-16 | Discharge: 2014-06-16 | Disposition: A | Discharge status: Home / Self Care | Payer: Medicare Other | Source: Ambulatory Visit | Attending: Internal Medicine | Admitting: Internal Medicine

## 2014-06-16 ENCOUNTER — Ambulatory Visit (HOSPITAL_BASED_OUTPATIENT_CLINIC_OR_DEPARTMENT_OTHER): Payer: Medicare Other

## 2014-06-16 VITALS — BP 140/66 | HR 95 | Temp 98.6°F | Resp 18

## 2014-06-16 DIAGNOSIS — D6481 Anemia due to antineoplastic chemotherapy: Secondary | ICD-10-CM | POA: Insufficient documentation

## 2014-06-16 DIAGNOSIS — R0600 Dyspnea, unspecified: Secondary | ICD-10-CM | POA: Insufficient documentation

## 2014-06-16 DIAGNOSIS — C9000 Multiple myeloma not having achieved remission: Secondary | ICD-10-CM | POA: Diagnosis not present

## 2014-06-16 DIAGNOSIS — Z5112 Encounter for antineoplastic immunotherapy: Secondary | ICD-10-CM | POA: Diagnosis not present

## 2014-06-16 DIAGNOSIS — T451X5A Adverse effect of antineoplastic and immunosuppressive drugs, initial encounter: Secondary | ICD-10-CM | POA: Insufficient documentation

## 2014-06-16 MED ORDER — HEPARIN SOD (PORK) LOCK FLUSH 100 UNIT/ML IV SOLN
500.0000 [IU] | Freq: Once | INTRAVENOUS | Status: AC | PRN
  Administered 2014-06-16: 500 [IU]
  Filled 2014-06-16: qty 5

## 2014-06-16 MED ORDER — SODIUM CHLORIDE 0.9 % IV SOLN
Freq: Once | INTRAVENOUS | Status: AC
  Administered 2014-06-16: 10:00:00 via INTRAVENOUS

## 2014-06-16 MED ORDER — DEXTROSE 5 % IV SOLN: 37.0000 mg/m2 | Freq: Once | INTRAVENOUS | Status: DC

## 2014-06-16 MED ORDER — SODIUM CHLORIDE 0.9 % IJ SOLN
10.0000 mL | INTRAMUSCULAR | Status: DC | PRN
  Administered 2014-06-16: 10 mL
  Filled 2014-06-16: qty 10

## 2014-06-16 MED ORDER — SODIUM CHLORIDE 0.9 % IV SOLN
Freq: Once | INTRAVENOUS | Status: AC
  Administered 2014-06-16: 10:00:00 via INTRAVENOUS
  Filled 2014-06-16: qty 4

## 2014-06-16 MED ORDER — DEXTROSE 5 % IV SOLN
37.0000 mg/m2 | Freq: Once | INTRAVENOUS | Status: AC
  Administered 2014-06-16: 70 mg via INTRAVENOUS
  Filled 2014-06-16: qty 35

## 2014-06-16 NOTE — Patient Instructions (Signed)
[facility] Discharge Instructions for Patients Receiving Chemotherapy  Today you received the following chemotherapy agents: Kyprolis   To help prevent nausea and vomiting after your treatment, we encourage you to take your nausea medication as directed.    If you develop nausea and vomiting that is not controlled by your nausea medication, call the clinic.   BELOW ARE SYMPTOMS THAT SHOULD BE REPORTED IMMEDIATELY:  *FEVER GREATER THAN 100.5 F  *CHILLS WITH OR WITHOUT FEVER  NAUSEA AND VOMITING THAT IS NOT CONTROLLED WITH YOUR NAUSEA MEDICATION  *UNUSUAL SHORTNESS OF BREATH  *UNUSUAL BRUISING OR BLEEDING  TENDERNESS IN MOUTH AND THROAT WITH OR WITHOUT PRESENCE OF ULCERS  *URINARY PROBLEMS  *BOWEL PROBLEMS  UNUSUAL RASH Items with * indicate a potential emergency and should be followed up as soon as possible.  Feel free to call the clinic you have any questions or concerns. The clinic phone number is [phone].  Please show the CHEMO ALERT CARD at check-in to the Emergency Department and triage nurse.   

## 2014-06-18 ENCOUNTER — Ambulatory Visit (HOSPITAL_BASED_OUTPATIENT_CLINIC_OR_DEPARTMENT_OTHER): Payer: Medicare Other

## 2014-06-18 VITALS — BP 119/60 | HR 66 | Temp 98.5°F | Resp 18

## 2014-06-18 DIAGNOSIS — C9000 Multiple myeloma not having achieved remission: Secondary | ICD-10-CM

## 2014-06-18 DIAGNOSIS — T451X5A Adverse effect of antineoplastic and immunosuppressive drugs, initial encounter: Secondary | ICD-10-CM | POA: Diagnosis not present

## 2014-06-18 DIAGNOSIS — D649 Anemia, unspecified: Secondary | ICD-10-CM | POA: Diagnosis not present

## 2014-06-18 DIAGNOSIS — R0600 Dyspnea, unspecified: Secondary | ICD-10-CM | POA: Diagnosis not present

## 2014-06-18 DIAGNOSIS — D6481 Anemia due to antineoplastic chemotherapy: Secondary | ICD-10-CM | POA: Diagnosis not present

## 2014-06-18 MED ORDER — SODIUM CHLORIDE 0.9 % IJ SOLN
10.0000 mL | INTRAMUSCULAR | Status: AC | PRN
  Administered 2014-06-18: 10 mL
  Filled 2014-06-18: qty 10

## 2014-06-18 MED ORDER — DIPHENHYDRAMINE HCL 25 MG PO CAPS
25.0000 mg | ORAL_CAPSULE | Freq: Once | ORAL | Status: AC
  Administered 2014-06-18: 25 mg via ORAL

## 2014-06-18 MED ORDER — HEPARIN SOD (PORK) LOCK FLUSH 100 UNIT/ML IV SOLN
250.0000 [IU] | INTRAVENOUS | Status: AC | PRN
  Administered 2014-06-18: 250 [IU]
  Filled 2014-06-18: qty 5

## 2014-06-18 MED ORDER — DIPHENHYDRAMINE HCL 25 MG PO CAPS
ORAL_CAPSULE | ORAL | Status: AC
  Filled 2014-06-18: qty 2

## 2014-06-18 MED ORDER — ACETAMINOPHEN 325 MG PO TABS
650.0000 mg | ORAL_TABLET | Freq: Once | ORAL | Status: AC
  Administered 2014-06-18: 650 mg via ORAL

## 2014-06-18 MED ORDER — ACETAMINOPHEN 325 MG PO TABS
ORAL_TABLET | ORAL | Status: AC
  Filled 2014-06-18: qty 2

## 2014-06-18 MED ORDER — SODIUM CHLORIDE 0.9 % IV SOLN
250.0000 mL | Freq: Once | INTRAVENOUS | Status: AC
  Administered 2014-06-18: 250 mL via INTRAVENOUS

## 2014-06-18 NOTE — Patient Instructions (Signed)

## 2014-06-20 LAB — TYPE AND SCREEN
ABO/RH(D): O POS
Antibody Screen: NEGATIVE
UNIT DIVISION: 0
Unit division: 0

## 2014-06-21 DIAGNOSIS — Z5111 Encounter for antineoplastic chemotherapy: Secondary | ICD-10-CM | POA: Insufficient documentation

## 2014-06-21 DIAGNOSIS — H3531 Nonexudative age-related macular degeneration: Secondary | ICD-10-CM | POA: Diagnosis not present

## 2014-06-21 DIAGNOSIS — H04123 Dry eye syndrome of bilateral lacrimal glands: Secondary | ICD-10-CM | POA: Diagnosis not present

## 2014-06-21 DIAGNOSIS — H43813 Vitreous degeneration, bilateral: Secondary | ICD-10-CM | POA: Diagnosis not present

## 2014-06-28 ENCOUNTER — Ambulatory Visit (HOSPITAL_BASED_OUTPATIENT_CLINIC_OR_DEPARTMENT_OTHER): Payer: Medicare Other

## 2014-06-28 ENCOUNTER — Other Ambulatory Visit (HOSPITAL_BASED_OUTPATIENT_CLINIC_OR_DEPARTMENT_OTHER): Payer: Medicare Other

## 2014-06-28 ENCOUNTER — Ambulatory Visit: Payer: Medicare Other | Admitting: Nutrition

## 2014-06-28 ENCOUNTER — Encounter: Payer: Self-pay | Admitting: Oncology

## 2014-06-28 ENCOUNTER — Telehealth: Payer: Self-pay | Admitting: Oncology

## 2014-06-28 ENCOUNTER — Ambulatory Visit (HOSPITAL_BASED_OUTPATIENT_CLINIC_OR_DEPARTMENT_OTHER): Payer: Medicare Other | Admitting: Oncology

## 2014-06-28 VITALS — BP 113/55 | HR 74 | Temp 99.2°F | Resp 20 | Ht 65.0 in | Wt 176.4 lb

## 2014-06-28 DIAGNOSIS — C9002 Multiple myeloma in relapse: Secondary | ICD-10-CM | POA: Diagnosis not present

## 2014-06-28 DIAGNOSIS — Z5112 Encounter for antineoplastic immunotherapy: Secondary | ICD-10-CM | POA: Diagnosis not present

## 2014-06-28 DIAGNOSIS — Z5111 Encounter for antineoplastic chemotherapy: Secondary | ICD-10-CM | POA: Diagnosis not present

## 2014-06-28 DIAGNOSIS — R110 Nausea: Secondary | ICD-10-CM

## 2014-06-28 DIAGNOSIS — C9000 Multiple myeloma not having achieved remission: Secondary | ICD-10-CM

## 2014-06-28 LAB — COMPREHENSIVE METABOLIC PANEL (CC13)
ALBUMIN: 3.4 g/dL — AB (ref 3.5–5.0)
ALT: 9 U/L (ref 0–55)
AST: 15 U/L (ref 5–34)
Alkaline Phosphatase: 100 U/L (ref 40–150)
Anion Gap: 9 mEq/L (ref 3–11)
BUN: 18.7 mg/dL (ref 7.0–26.0)
CHLORIDE: 112 meq/L — AB (ref 98–109)
CO2: 22 meq/L (ref 22–29)
CREATININE: 2.9 mg/dL — AB (ref 0.6–1.1)
Calcium: 8 mg/dL — ABNORMAL LOW (ref 8.4–10.4)
EGFR: 15 mL/min/{1.73_m2} — ABNORMAL LOW (ref >90)
Glucose: 95 mg/dl (ref 70–140)
POTASSIUM: 4.2 meq/L (ref 3.5–5.1)
Sodium: 143 mEq/L (ref 136–145)
Total Bilirubin: 1.3 mg/dL — ABNORMAL HIGH (ref 0.20–1.20)
Total Protein: 5.6 g/dL — ABNORMAL LOW (ref 6.4–8.3)

## 2014-06-28 LAB — CBC WITH DIFFERENTIAL/PLATELET
BASO%: 0.8 % (ref 0.0–2.0)
BASOS ABS: 0 10*3/uL (ref 0.0–0.1)
EOS%: 3.3 % (ref 0.0–7.0)
Eosinophils Absolute: 0.2 10*3/uL (ref 0.0–0.5)
HCT: 27.5 % — ABNORMAL LOW (ref 34.8–46.6)
HGB: 8.8 g/dL — ABNORMAL LOW (ref 11.6–15.9)
LYMPH%: 22.1 % (ref 14.0–49.7)
MCH: 33.3 pg (ref 25.1–34.0)
MCHC: 32 g/dL (ref 31.5–36.0)
MCV: 104.2 fL — ABNORMAL HIGH (ref 79.5–101.0)
MONO#: 0.8 10*3/uL (ref 0.1–0.9)
MONO%: 15.7 % — ABNORMAL HIGH (ref 0.0–14.0)
NEUT%: 58.1 % (ref 38.4–76.8)
NEUTROS ABS: 3 10*3/uL (ref 1.5–6.5)
Platelets: 128 10*3/uL — ABNORMAL LOW (ref 145–400)
RBC: 2.64 10*6/uL — ABNORMAL LOW (ref 3.70–5.45)
RDW: 18.9 % — AB (ref 11.2–14.5)
WBC: 5.2 10*3/uL (ref 3.9–10.3)
lymph#: 1.1 10*3/uL (ref 0.9–3.3)

## 2014-06-28 MED ORDER — SODIUM CHLORIDE 0.9 % IJ SOLN
10.0000 mL | INTRAMUSCULAR | Status: DC | PRN
  Administered 2014-06-28: 10 mL
  Filled 2014-06-28: qty 10

## 2014-06-28 MED ORDER — SODIUM CHLORIDE 0.9 % IV SOLN
Freq: Once | INTRAVENOUS | Status: AC
  Administered 2014-06-28: 11:00:00 via INTRAVENOUS

## 2014-06-28 MED ORDER — CARFILZOMIB CHEMO INJECTION 60 MG
37.0000 mg/m2 | Freq: Once | INTRAVENOUS | Status: AC
  Administered 2014-06-28: 70 mg via INTRAVENOUS
  Filled 2014-06-28: qty 35

## 2014-06-28 MED ORDER — SODIUM CHLORIDE 0.9 % IV SOLN
Freq: Once | INTRAVENOUS | Status: AC
  Administered 2014-06-28: 12:00:00 via INTRAVENOUS

## 2014-06-28 MED ORDER — CYCLOPHOSPHAMIDE CHEMO INJECTION 1 GM
300.0000 mg/m2 | Freq: Once | INTRAMUSCULAR | Status: AC
  Administered 2014-06-28: 560 mg via INTRAVENOUS
  Filled 2014-06-28: qty 28

## 2014-06-28 MED ORDER — DEXAMETHASONE SODIUM PHOSPHATE 100 MG/10ML IJ SOLN
Freq: Once | INTRAMUSCULAR | Status: AC
  Administered 2014-06-28: 12:00:00 via INTRAVENOUS
  Filled 2014-06-28: qty 4

## 2014-06-28 MED ORDER — HEPARIN SOD (PORK) LOCK FLUSH 100 UNIT/ML IV SOLN
500.0000 [IU] | Freq: Once | INTRAVENOUS | Status: AC | PRN
  Administered 2014-06-28: 500 [IU]
  Filled 2014-06-28: qty 5

## 2014-06-28 MED ORDER — OLANZAPINE 10 MG PO TABS: 10.0000 mg | ORAL_TABLET | Freq: Every evening | ORAL | Status: DC | PRN

## 2014-06-28 NOTE — Telephone Encounter (Signed)
Appointments made and avs will be printed in chemo

## 2014-06-28 NOTE — Patient Instructions (Signed)
[place] Discharge Instructions for Patients Receiving Chemotherapy  Today you received the following chemotherapy agents Kyprolis and Cytoxan.  To help prevent nausea and vomiting after your treatment, we encourage you to take your nausea medication as prescribed.   If you develop nausea and vomiting that is not controlled by your nausea medication, call the clinic.   BELOW ARE SYMPTOMS THAT SHOULD BE REPORTED IMMEDIATELY:  *FEVER GREATER THAN 100.5 F  *CHILLS WITH OR WITHOUT FEVER  NAUSEA AND VOMITING THAT IS NOT CONTROLLED WITH YOUR NAUSEA MEDICATION  *UNUSUAL SHORTNESS OF BREATH  *UNUSUAL BRUISING OR BLEEDING  TENDERNESS IN MOUTH AND THROAT WITH OR WITHOUT PRESENCE OF ULCERS  *URINARY PROBLEMS  *BOWEL PROBLEMS  UNUSUAL RASH Items with * indicate a potential emergency and should be followed up as soon as possible.  Feel free to call the clinic you have any questions or concerns. The clinic phone number is [phone].  Please show the [place] at check-in to the Emergency Department and triage nurse.

## 2014-06-28 NOTE — Progress Notes (Signed)
[place]  [phone] [phone]  OFFICE PROGRESS NOTE   PRINCIPAL DIAGNOSES:  1. Recurrent multiple myeloma initially diagnosed in 2002, initially with smoldering myeloma at [facility].  2. Ductal carcinoma in situ status post mastectomy with sentinel lymph node biopsy in October 2008.  PRIOR THERAPY:  1. Status post treatment with tamoxifen from November 2008 through February 2009, discontinued secondary to intolerance. 2. Status post 3 cycles of chemotherapy with Revlimid and Decadron followed by 1 cycle of Decadron only with mild response. 3. Status post 2 cycles of systemic chemotherapy with Velcade, Doxil and Decadron discontinued secondary to significant peripheral neuropathy. Last dose was given May 2010 at [facility]. 4. Status post autologous peripheral blood stem cell transplant on October 01, 2008 at [facility] under the care of Dr. Phyllis Ginger.  5. Systemic chemotherapy with Carfilzomib initially was 20 mg/M2 and will be increased after cycle #1 to 36 mg/M2 on days 1, 2, 8, 9, 15 and 16 every 4 weeks in addition to Cytoxan 300 mg/M2 and Decadron 40 mg by mouth weekly basis, status post 4 cycles. First cycle on 12/29/2012.    CURRENT THERAPY: She resumed chemotherapy with Carfilzomib, Cytoxan, and Decadron on 05/03/2014. Status post 2 cycles  INTERVAL HISTORY: Nancy Norris 74 y.o. female returns to the clinic today for followup visit. He continues to have fatigue, but her dyspnea is better.she has nausea all the time. No vomiting. She is using Zofran typically when she is out of the house and uses Compazine when she is at home in case it makes her sedated. No fever or chills. The patient denied having any chest pain, shortness of breath, cough or hemoptysis. Denies bleeding. No significant weight loss or night sweats. Her neuropathy is at baseline. She reports some trouble swallowing, but this is unchanged.  Her edema has improved as she has recently started on Lasix 40 mg daily. The patient is due for cycle 3 day 1 of her chemotherapy today.  MEDICAL HISTORY: Past Medical History  Diagnosis Date  . Hyperkalemia   . Hypothyroidism   . COPD (chronic obstructive pulmonary disease)   . Hyponatremia   . Dizziness   . Fibromyalgia   . Breast cancer   . Multiple myeloma   . Mucositis   . Hx of echocardiogram     a.  Echocardiogram (12/26/2012): EF 66-44%, grade 1 diastolic dysfunction;   b.  Echocardiogram (02/2013): EF 55-60%, no WMA, trivial effusion  . Fibromyalgia   . Arthritis   . Hx of cardiovascular stress test     LexiScan with low level exercise Myoview (02/2013): No ischemia, EF 72%; normal study  . Complication of anesthesia   . PONV (postoperative nausea and vomiting) 2008    after mastestomy  . Myocardial infarction     in past, patient was unaware.   . Anginal pain     used NTG x 2 May 31 and 06/15/13   . GERD (gastroesophageal reflux disease)   . Headache(784.0)   . Anxiety   . Depression   . CKD (chronic kidney disease) stage 3, GFR 30-59 ml/min   . Anemia   . History of blood transfusion     last one May 12   . Neuropathy     ALLERGIES:  is allergic to codeine; latex; other; onion; adhesive; iodinated diagnostic agents; and sulfa antibiotics.  MEDICATIONS:  Current Outpatient Prescriptions  Medication Sig Dispense Refill  . acetaminophen (TYLENOL) 325 MG tablet  Take 650 mg by mouth every 6 (six) hours as needed for mild pain or moderate pain.     Marland Kitchen albuterol (PROVENTIL HFA;VENTOLIN HFA) 108 (90 BASE) MCG/ACT inhaler Inhale 2 puffs into the lungs 2 (two) times daily as needed for wheezing or shortness of breath. 1 each 6  . ascorbic acid (VITAMIN C) 250 MG CHEW Chew 250 mg by mouth daily.    Marland Kitchen aspirin EC 81 MG tablet Take 1 tablet (81 mg total) by mouth daily.    Marland Kitchen buPROPion (WELLBUTRIN XL) 300 MG 24 hr tablet Take 300 mg by mouth daily.   98  .  carboxymethylcellulose (REFRESH PLUS) 0.5 % SOLN Place 1 drop into both eyes every 2 (two) hours.    . Cholecalciferol 4000 UNITS TABS Take 4,000 Units by mouth daily.    . citalopram (CELEXA) 40 MG tablet TAKE 1 TABLET BY MOUTH ONCE DAILY. 90 tablet 1  . cromolyn (OPTICROM) 4 % ophthalmic solution Place 1 drop into both eyes 4 (four) times daily.    Marland Kitchen dexamethasone (DECADRON) 4 MG tablet Take 10 tablets by mouth once a week as directed 40 tablet 3  . esomeprazole (NEXIUM) 40 MG capsule TAKE 1 CAPSULE BY MOUTH DAILY, EACH MORNING FOR ACID INDIGESTION 30 capsule PRN  . Ferrous Sulfate 134 MG TABS Take 1 tablet (134 mg total) by mouth every other day. 60 tablet 2  . fludrocortisone (FLORINEF) 0.1 MG tablet TAKE 1 TO 2 TABLETS DAILY AS DIRECTED FOR LOW BLOOD PRESSURE. 60 tablet 1  . fluticasone (FLONASE) 50 MCG/ACT nasal spray Place 2 sprays into both nostrils at bedtime.     . furosemide (LASIX) 40 MG tablet Take 40 mg by mouth daily.    Marland Kitchen gabapentin (NEURONTIN) 300 MG capsule Take 1 capsule (300 mg total) by mouth at bedtime. 90 capsule 3  . isosorbide mononitrate (IMDUR) 30 MG 24 hr tablet TAKE 1 TABLET BY MOUTH DAILY. 30 tablet 11  . levothyroxine (SYNTHROID, LEVOTHROID) 150 MCG tablet TAKE 1 TABLET BY MOUTH ONCE DAILY. 90 tablet 1  . loratadine (CLARITIN) 10 MG tablet Take 10 mg by mouth daily.    Marland Kitchen loteprednol (ALREX) 0.2 % SUSP Place 1 drop into both eyes 4 (four) times daily. 1 Bottle PRN  . meclizine (ANTIVERT) 32 MG tablet Take 1 tablet (32 mg total) by mouth 3 (three) times daily as needed. 30 tablet 0  . midodrine (PROAMATINE) 10 MG tablet Take 1 tablet (10 mg total) by mouth 3 (three) times daily. 90 tablet 11  . nitroGLYCERIN (NITROSTAT) 0.4 MG SL tablet Place 1 tablet (0.4 mg total) under the tongue every 5 (five) minutes as needed for chest pain. 25 tablet 3  . nystatin (MYCOSTATIN/NYSTOP) 100000 UNIT/GM POWD Apply 1 g topically 2 (two) times daily. Apply to belly    . ondansetron  (ZOFRAN-ODT) 8 MG disintegrating tablet Take 1 tablet (8 mg total) by mouth every 8 (eight) hours as needed for nausea or vomiting. 20 tablet 3  . polyethylene glycol (MIRALAX / GLYCOLAX) packet Take 17 g by mouth daily.    . prochlorperazine (COMPAZINE) 10 MG tablet TAKE 1 TABLET BY MOUTH EVERY 6 HOURS AS NEEDED FOR NAUSEA OR VOMITING. 60 tablet 0  . ranitidine (ZANTAC) 300 MG tablet TAKE 1 TABLET BY MOUTH AT BEDTIME, FOR ACID REFLUX AND INDIGESTION. 30 tablet PRN  . RESTASIS 0.05 % ophthalmic emulsion Place 1 drop into both eyes 2 (two) times daily.   4  . zolpidem (AMBIEN) 5  MG tablet TAKE 1 TABLET BY MOUTH AT BEDTIME AS NEEDED FOR SLEEP. 30 tablet 0  . OLANZapine (ZYPREXA) 10 MG tablet Take 1 tablet (10 mg total) by mouth at bedtime as needed. 30 tablet 0   No current facility-administered medications for this visit.   Facility-Administered Medications Ordered in Other Visits  Medication Dose Route Frequency Provider Last Rate Last Dose  . 0.9 %  sodium chloride infusion   Intravenous Once Adrena E Johnson, PA-C      . sodium chloride 0.9 % injection 10 mL  10 mL Intracatheter PRN Curt Bears, MD   10 mL at 01/05/13 1825  . sodium chloride 0.9 % injection 10 mL  10 mL Intracatheter PRN Curt Bears, MD   10 mL at 06/15/14 1514  . sodium chloride 0.9 % injection 10 mL  10 mL Intracatheter PRN Curt Bears, MD   10 mL at 06/28/14 1341    SURGICAL HISTORY:  Past Surgical History  Procedure Laterality Date  . History of port removal    . Status post stem cell transplant on September 28, 2008.    Marland Kitchen Abdominal hysterectomy  1981  . Cholecystectomy  1971  . Mastectomy Left 2008  . Cataract extraction, bilateral    . Breast surgery    . Eye surgery Bilateral     lens implant  . Breast reconstruction    . Portacath placement  12/2012    has had 2  . Av fistula placement Left 06/19/2013    Procedure: CREATION OF LEFT ARM ARTERIOVENOUS (AV) FISTULA ;  Surgeon: Angelia Mould,  MD;  Location: [facility] OR;  Service: Vascular;  Laterality: Left;    REVIEW OF SYSTEMS:  A comprehensive ROS was negative except as noted in HPI.   PHYSICAL EXAMINATION: General appearance: alert, cooperative and no distress Head: Normocephalic, without obvious abnormality, atraumatic Neck: no adenopathy Lymph nodes: Cervical, supraclavicular, and axillary nodes normal. Resp: clear to auscultation bilaterally Back: symmetric, no curvature. ROM normal. No CVA tenderness. Cardio: regular rate and rhythm, S1, S2 normal, no murmur, click, rub or gallop GI: soft, non-tender; bowel sounds normal; no masses,  no organomegaly Extremities: edema 1+ in the bilateral lower extremities. Neurologic: Alert and oriented X 3, normal strength and tone. Normal symmetric reflexes. Normal coordination and gait  ECOG PERFORMANCE STATUS: 1 - Symptomatic but completely ambulatory  Blood pressure 113/55, pulse 74, temperature 99.2 F (37.3 C), temperature source Oral, resp. rate 20, height 5' 5" (1.651 m), weight 176 lb 6.4 oz (80.015 kg), SpO2 99 %.  LABORATORY DATA: Lab Results  Component Value Date   WBC 5.2 06/28/2014   HGB 8.8* 06/28/2014   HCT 27.5* 06/28/2014   MCV 104.2* 06/28/2014   PLT 128* 06/28/2014      Chemistry      Component Value Date/Time   NA 143 06/28/2014 0932   NA 138 04/03/2014 0306   K 4.2 06/28/2014 0932   K 3.3* 04/03/2014 0306   CL 110 04/03/2014 0306   CL 105 03/19/2012 0811   CO2 22 06/28/2014 0932   CO2 22 04/03/2014 0306   BUN 18.7 06/28/2014 0932   BUN 33* 04/03/2014 0306   CREATININE 2.9* 06/28/2014 0932   CREATININE 4.29* 04/03/2014 0306   CREATININE 3.87* 03/23/2014 1558      Component Value Date/Time   CALCIUM 8.0* 06/28/2014 0932   CALCIUM 8.3* 04/03/2014 0306   ALKPHOS 100 06/28/2014 0932   ALKPHOS 71 04/03/2014 0306   AST 15 06/28/2014 0932  AST 20 04/03/2014 0306   ALT 9 06/28/2014 0932   ALT 26 04/03/2014 0306   BILITOT 1.30* 06/28/2014 0932    BILITOT 0.7 04/03/2014 0306     Other lab results: Beta-2 microglobulin 9.07, free kappa light chain 1.32, free lambda light chain 0.16, kappa/lambda ratio 8.25. IgG 358 IgA <6 and IgM 8    RADIOGRAPHIC STUDIES:  ASSESSMENT AND PLAN: This is a very pleasant 74 year old white female with relapsed multiple myeloma. She is currently undergoing systemic chemotherapy with Carfilzomib, Cytoxan and dexamethasone status post 4 cycles. She was placed on observation but after consulting with her nephrologist she resumed treatment for her multiple myeloma.   Patient seen and examined with Dr. Julien Nordmann. Labs reviewed. Her creatinine has decreased down to 2.9 today. The patient was very happy to hear this information. Recommend that she proceed with cycle 3 day 1 of her chemotherapy today.   For her nausea, she will continue the Zofran and Compazine. She was given a prescription for Zyprexa 10 mg to be used at bedtime as needed. Side effects were discussed including dizziness. The patient would like to try this medication.   She is scheduled back in 2 weeks for symptom management visit.  She was advised to call immediately if she has any concerning symptoms in the interval.   Mikey Bussing, NP 06/28/2014  ADDENDUM: Hematology/Oncology Attending:  I had a face to face encounter with the patient today. I recommended her care plan. This is a very pleasant 74 years old white female with recurrent multiple myeloma currently undergoing treatment with Carfilzomib, Cytoxan and dexamethasone status post 4 cycles and tolerating her treatment fairly well except for fatigue secondary to anemia of chronic disease. She also has persistent nausea and not responding well to Zofran and Compazine. We will start the patient on Zyprexa 10 mg by mouth daily at bedtime. She will continue with her chemotherapy as scheduled. The patient would come back for follow-up visit in 2 weeks for reevaluation. She was advised to call  immediately if she has any concerning symptoms in the interval.  Disclaimer: This note was dictated with voice recognition software. Similar sounding words can inadvertently be transcribed and may be missed upon review.  [name]., MD 06/28/2014

## 2014-06-28 NOTE — Progress Notes (Signed)
Nutrition follow-up completed with patient receiving treatment for recurrent multiple myeloma.  Current weight was documented as 176 pounds on June 13. Patient reports weight gain.  Partially a response to fluid retention. Creatinine has decreased and was documented as 2.9. Patient tried oral nutrition supplements but did not care for them. She enjoys drinking soy milk. Has been trying to eat more often.  Still does not like to cook for herself.  Nutrition diagnosis: Food and nutrition related knowledge deficit continues.  Intervention: Educated patient to follow a low-sodium diet with adequate protein and calories to promote maintenance of lean muscle mass. Reviewed function of protein to help reduce fluid retention. Encouraged patient try to eat 5 times a day. Patient understands goal is maintenance of lean body mass.  Monitoring, evaluation, goals: Patient will tolerate low-sodium diet with adequate protein and calories from lean body maintenance.  Next visit: Tuesday, June 28, during infusion.  **Disclaimer: This note was dictated with voice recognition software. Similar sounding words can inadvertently be transcribed and this note may contain transcription errors which may not have been corrected upon publication of note.**

## 2014-06-28 NOTE — Progress Notes (Signed)
Previously treated with a CRT of 4.3 on 5/23. CRT is 2.9 today

## 2014-06-29 ENCOUNTER — Telehealth: Payer: Self-pay | Admitting: Internal Medicine

## 2014-06-29 ENCOUNTER — Other Ambulatory Visit: Payer: Self-pay | Admitting: Medical Oncology

## 2014-06-29 ENCOUNTER — Ambulatory Visit: Payer: Medicare Other

## 2014-06-29 ENCOUNTER — Telehealth: Payer: Self-pay | Admitting: *Deleted

## 2014-06-29 NOTE — Progress Notes (Signed)
Patient ID: Nancy Norris, female   DOB: 24-Jun-1940, 74 y.o.   MRN: [id number]  [place] [place],   [address] Phone: [phone] Fax:  [phone]  Date:  07/05/2014   ID:  Nancy Norris, DOB [birth year], MRN [id number]  PCP:  [name], MD  Cardiologist:  Dr. Jenkins Rouge  Nephrologist:  Dr. Marval Regal    History of Present Illness: Nancy Norris is a 74 y.o. female with a hx of recurrent multiple myeloma (s/p stem cell transplant in 2010), COPD, hypothyroidism, ductal carcinoma in situ status post mastectomy, CKD, HTN, GERD. She was first seen by me in 2012  for surgical clearance. She had an abnormal ECG.  An echocardiogram demonstrated normal LV function and she was cleared for surgery. F/U Echocardiogram (12/26/2012): EF 69-79%, grade 1 diastolic dysfunction.  She was recently admitted 12/30-01/15/14  with abrupt onset of dyspnea (while coming back from chemotherapy).  She was profoundly anemic with a hemoglobin of 6.8. She was transfused with packed red blood cells. She had worsening renal function. Chest CT angiogram was not performed. VQ scan was negative for pulmonary embolism.  LE venous Dopplers were negative for DVT bilaterally.  There was some concern for volume overload and she was treated with IV Lasix x1.  She was evaluated by Dr. Annamaria Boots for pulmonology. PFTs have been scheduled. Patient noted chest discomfort as well and she has been referred back to cardiology for evaluation.  On dialysis now with fistula  Dizzy  Chest pains resolved  Normal myovue 2/15   Significant postural sympotms Echo 04/09/14 reviewed  Impressions:  - Normal LV size and systolic function, EF 48-01%. Moderate diastolic dysfunction. Pseudonormal  Normal RV size and systolic function. Mild mitral regurgitation.   Lab Results  Component Value Date   CREATININE 2.9* 06/28/2014   Lab Results  Component Value Date   HCT 27.5* 06/28/2014   Last visit started on  midodrine to support BP for dialysis     Recent Labs: 04/20/2014: HDL 31*; LDL Cholesterol 98; TSH 0.051* 06/28/2014: ALT 9; Creatinine 2.9*; HGB 8.8*; Potassium 4.2  Wt Readings from Last 3 Encounters:  07/05/14 81.557 kg (179 lb 12.8 oz)  06/28/14 80.015 kg (176 lb 6.4 oz)  06/15/14 79.153 kg (174 lb 8 oz)     Past Medical History  Diagnosis Date  . Hyperkalemia   . Hypothyroidism   . COPD (chronic obstructive pulmonary disease)   . Hyponatremia   . Dizziness   . Fibromyalgia   . Breast cancer   . Multiple myeloma   . Mucositis   . Hx of echocardiogram     a.  Echocardiogram (12/26/2012): EF 65-53%, grade 1 diastolic dysfunction;   b.  Echocardiogram (02/2013): EF 55-60%, no WMA, trivial effusion  . Fibromyalgia   . Arthritis   . Hx of cardiovascular stress test     LexiScan with low level exercise Myoview (02/2013): No ischemia, EF 72%; normal study  . Complication of anesthesia   . PONV (postoperative nausea and vomiting) 2008    after mastestomy  . Myocardial infarction     in past, patient was unaware.   . Anginal pain     used NTG x 2 May 31 and 06/15/13   . GERD (gastroesophageal reflux disease)   . Headache(784.0)   . Anxiety   . Depression   . CKD (chronic kidney disease) stage 3, GFR 30-59 ml/min   . Anemia   . History of blood  transfusion     last one May 12   . Neuropathy     Current Outpatient Prescriptions  Medication Sig Dispense Refill  . acetaminophen (TYLENOL) 325 MG tablet Take 650 mg by mouth every 6 (six) hours as needed for mild pain or moderate pain.     Marland Kitchen albuterol (PROVENTIL HFA;VENTOLIN HFA) 108 (90 BASE) MCG/ACT inhaler Inhale 2 puffs into the lungs 2 (two) times daily as needed for wheezing or shortness of breath. 1 each 6  . ascorbic acid (VITAMIN C) 250 MG CHEW Chew 250 mg by mouth daily.    Marland Kitchen aspirin EC 81 MG tablet Take 1 tablet (81 mg total) by mouth daily.    Marland Kitchen buPROPion (WELLBUTRIN XL) 300 MG 24 hr tablet Take 300 mg by mouth daily.    98  . carboxymethylcellulose (REFRESH PLUS) 0.5 % SOLN Place 1 drop into both eyes every 2 (two) hours.    . Cholecalciferol 4000 UNITS TABS Take 4,000 Units by mouth daily.    . citalopram (CELEXA) 40 MG tablet TAKE 1 TABLET BY MOUTH ONCE DAILY. 90 tablet 1  . cromolyn (OPTICROM) 4 % ophthalmic solution Place 1 drop into both eyes 4 (four) times daily.    Marland Kitchen dexamethasone (DECADRON) 4 MG tablet Take 10 tablets by mouth once a week as directed 40 tablet 3  . esomeprazole (NEXIUM) 40 MG capsule TAKE 1 CAPSULE BY MOUTH DAILY, EACH MORNING FOR ACID INDIGESTION 30 capsule PRN  . Ferrous Sulfate 134 MG TABS Take 1 tablet (134 mg total) by mouth every other day. 60 tablet 2  . fludrocortisone (FLORINEF) 0.1 MG tablet TAKE 1 TO 2 TABLETS DAILY AS DIRECTED FOR LOW BLOOD PRESSURE. 60 tablet 1  . fluticasone (FLONASE) 50 MCG/ACT nasal spray Place 2 sprays into both nostrils at bedtime.     . furosemide (LASIX) 40 MG tablet Take 40 mg by mouth daily.    Marland Kitchen gabapentin (NEURONTIN) 300 MG capsule Take 1 capsule (300 mg total) by mouth at bedtime. 90 capsule 3  . isosorbide mononitrate (IMDUR) 30 MG 24 hr tablet TAKE 1 TABLET BY MOUTH DAILY. 30 tablet 11  . levothyroxine (SYNTHROID, LEVOTHROID) 150 MCG tablet TAKE 1 TABLET BY MOUTH ONCE DAILY. 90 tablet 1  . loratadine (CLARITIN) 10 MG tablet Take 10 mg by mouth daily.    Marland Kitchen loteprednol (ALREX) 0.2 % SUSP Place 1 drop into both eyes 4 (four) times daily. 1 Bottle PRN  . meclizine (ANTIVERT) 32 MG tablet Take 1 tablet (32 mg total) by mouth 3 (three) times daily as needed. 30 tablet 0  . midodrine (PROAMATINE) 10 MG tablet Take 1 tablet (10 mg total) by mouth 3 (three) times daily. 90 tablet 11  . nitroGLYCERIN (NITROSTAT) 0.4 MG SL tablet Place 1 tablet (0.4 mg total) under the tongue every 5 (five) minutes as needed for chest pain. 25 tablet 3  . nystatin (MYCOSTATIN/NYSTOP) 100000 UNIT/GM POWD Apply 1 g topically 2 (two) times daily. Apply to belly    .  ondansetron (ZOFRAN-ODT) 8 MG disintegrating tablet Take 1 tablet (8 mg total) by mouth every 8 (eight) hours as needed for nausea or vomiting. 20 tablet 3  . polyethylene glycol (MIRALAX / GLYCOLAX) packet Take 17 g by mouth daily.    . prochlorperazine (COMPAZINE) 10 MG tablet TAKE 1 TABLET BY MOUTH EVERY 6 HOURS AS NEEDED FOR NAUSEA OR VOMITING. 60 tablet 0  . ranitidine (ZANTAC) 300 MG tablet TAKE 1 TABLET BY MOUTH AT BEDTIME, FOR  ACID REFLUX AND INDIGESTION. 30 tablet PRN  . RESTASIS 0.05 % ophthalmic emulsion Place 1 drop into both eyes 2 (two) times daily.   4  . zolpidem (AMBIEN) 5 MG tablet TAKE 1 TABLET BY MOUTH AT BEDTIME AS NEEDED FOR SLEEP. 30 tablet 0   No current facility-administered medications for this visit.   Facility-Administered Medications Ordered in Other Visits  Medication Dose Route Frequency Provider Last Rate Last Dose  . 0.9 %  sodium chloride infusion   Intravenous Once Adrena E Johnson, PA-C      . sodium chloride 0.9 % injection 10 mL  10 mL Intracatheter PRN Curt Bears, MD   10 mL at 01/05/13 1825  . sodium chloride 0.9 % injection 10 mL  10 mL Intracatheter PRN Curt Bears, MD   10 mL at 06/15/14 1514    Allergies:   Codeine; Latex; Other; Onion; Zyprexa; Adhesive; Iodinated diagnostic agents; and Sulfa antibiotics   Social History:  The patient  reports that she quit smoking about 9 years ago. Her smoking use included Cigarettes. She has a 30 pack-year smoking history. She has never used smokeless tobacco. She reports that she does not drink alcohol or use illicit drugs.   Family History:  The patient's family history includes Arthritis in her mother; Asthma in her mother; Cancer in her sister; Hyperlipidemia in her brother.   ROS:  Please see the history of present illness.   She denies fevers, chills, cough, melena, hematochezia.   All other systems reviewed and negative.   PHYSICAL EXAM: VS:  BP 132/60 mmHg  Pulse 69  Ht 5' 5.5" (1.664 m)  Wt  81.557 kg (179 lb 12.8 oz)  BMI 29.45 kg/m2  SpO2 98% BP 130 / palp standing with no  dizzyness  Well nourished, well developed, in no acute distress HEENT: normal Neck: no JVD Vascular: No carotid bruit Chest: Tender to palpation Cardiac:  normal S1, S2; RRR; no murmurno rub Lungs:  clear to auscultation bilaterally, no wheezing, rhonchi or rales Abd: soft, + epigastric tenderness, no hepatomegaly Ext: no edema Skin: warm and dry Neuro:  CNs 2-12 intact, no focal abnormalities noted Large fistula in LUE with thrill   EKG:   04/20/14  NSR, HR 65, normal axis, nonspecific ST-T wave changes     ASSESSMENT AND PLAN:  1. Chest Pain: resolved two normal myovue's last year 2. Postural Hypotension improved can probably stop florinef will send note to oncologist 3. CKD: Continue follow up with nephrology. Some LUE steal from fistula Cr improved in 2.8 range now  4. Multiple Myeloma: Continue follow up with oncology. Primary issue is renal impact/failure  5. COPD: Formal testing is pending. Follow up with pulmonology as planned. 6.  Disposition: Follow up with me in6 months    Jenkins Rouge

## 2014-06-29 NOTE — Telephone Encounter (Signed)
Called pt d/t missed appt in infusion room & she reports that she picked up some new nausea med & had an awful hs & had to use her walker to get to the bathroom b/c she was disoriented.  She states she tried to call our office but someone changed the battery in her phone & she lost her contacts.  She states the nausea med is helping but just made her disoriented.  Message left for Dr Worthy Flank RN to call pt.

## 2014-06-29 NOTE — Telephone Encounter (Signed)
Called patient and her chemo has been rescheduled to 6/15 per pof   anne

## 2014-06-29 NOTE — Telephone Encounter (Signed)
Pt describes that she took Zyprexa last night at 10 pm and stated it made her disoriented and dizzy early this  am when she got up to bathroom. She had to use her walker to go to bathroom. She felt a little nauseated , no vomiting. Her voice was very strong and she wants to reschedule tx for another day.

## 2014-06-30 ENCOUNTER — Telehealth: Payer: Self-pay | Admitting: Medical Oncology

## 2014-06-30 ENCOUNTER — Ambulatory Visit (HOSPITAL_BASED_OUTPATIENT_CLINIC_OR_DEPARTMENT_OTHER): Payer: Medicare Other

## 2014-06-30 VITALS — BP 133/61 | HR 79 | Temp 98.1°F | Resp 18

## 2014-06-30 DIAGNOSIS — C9000 Multiple myeloma not having achieved remission: Secondary | ICD-10-CM | POA: Diagnosis not present

## 2014-06-30 DIAGNOSIS — Z5112 Encounter for antineoplastic immunotherapy: Secondary | ICD-10-CM | POA: Diagnosis not present

## 2014-06-30 MED ORDER — DEXTROSE 5 % IV SOLN
37.0000 mg/m2 | Freq: Once | INTRAVENOUS | Status: AC
  Administered 2014-06-30: 70 mg via INTRAVENOUS
  Filled 2014-06-30: qty 35

## 2014-06-30 MED ORDER — SODIUM CHLORIDE 0.9 % IJ SOLN
10.0000 mL | INTRAMUSCULAR | Status: DC | PRN
  Administered 2014-06-30: 10 mL
  Filled 2014-06-30: qty 10

## 2014-06-30 MED ORDER — SODIUM CHLORIDE 0.9 % IV SOLN
Freq: Once | INTRAVENOUS | Status: AC
  Administered 2014-06-30: 13:00:00 via INTRAVENOUS

## 2014-06-30 MED ORDER — HEPARIN SOD (PORK) LOCK FLUSH 100 UNIT/ML IV SOLN
500.0000 [IU] | Freq: Once | INTRAVENOUS | Status: AC | PRN
  Administered 2014-06-30: 500 [IU]
  Filled 2014-06-30: qty 5

## 2014-06-30 MED ORDER — SODIUM CHLORIDE 0.9 % IV SOLN
Freq: Once | INTRAVENOUS | Status: AC
  Administered 2014-06-30: 12:00:00 via INTRAVENOUS
  Filled 2014-06-30: qty 4

## 2014-06-30 MED ORDER — SODIUM CHLORIDE 0.9 % IV SOLN
Freq: Once | INTRAVENOUS | Status: AC
  Administered 2014-06-30: 12:00:00 via INTRAVENOUS

## 2014-06-30 NOTE — Progress Notes (Signed)
Per Erasmo Downer Curcio's note from office visit on 06/28/14, okay to proceed with treatment with Creatinine 2.9.

## 2014-06-30 NOTE — Telephone Encounter (Signed)
-----   Message from Curt Bears, MD sent at 06/29/2014  7:25 PM EDT ----- Regarding: RE: confusion , dizzy from zyprexa Stop Zyprexa ----- Message -----    From: Ardeen Garland, RN    Sent: 06/29/2014   1:18 PM      To: Curt Bears, MD Subject: confusion , dizzy from zyprexa                 Pt took a zyprexa yesterday and got up early this am to go to [place] . She was very confused, dizzy and had to use her walker to go to bathroom. Pt cancelled chemo today due to side effects. Her voice was strong , speech clear. She wants to make up the treatment.  I put in Onc tx request to r/s chemo tomorrow.  What about zyprexa dose?

## 2014-06-30 NOTE — Patient Instructions (Signed)
[facility] Discharge Instructions for Patients Receiving Chemotherapy  Today you received the following chemotherapy agents: Kyprolis   To help prevent nausea and vomiting after your treatment, we encourage you to take your nausea medication as directed.    If you develop nausea and vomiting that is not controlled by your nausea medication, call the clinic.   BELOW ARE SYMPTOMS THAT SHOULD BE REPORTED IMMEDIATELY:  *FEVER GREATER THAN 100.5 F  *CHILLS WITH OR WITHOUT FEVER  NAUSEA AND VOMITING THAT IS NOT CONTROLLED WITH YOUR NAUSEA MEDICATION  *UNUSUAL SHORTNESS OF BREATH  *UNUSUAL BRUISING OR BLEEDING  TENDERNESS IN MOUTH AND THROAT WITH OR WITHOUT PRESENCE OF ULCERS  *URINARY PROBLEMS  *BOWEL PROBLEMS  UNUSUAL RASH Items with * indicate a potential emergency and should be followed up as soon as possible.  Feel free to call the clinic you have any questions or concerns. The clinic phone number is [phone].  Please show the CHEMO ALERT CARD at check-in to the Emergency Department and triage nurse.   

## 2014-06-30 NOTE — Telephone Encounter (Signed)
Pt notified to stop Zyprexa.

## 2014-07-02 ENCOUNTER — Other Ambulatory Visit: Payer: Medicare Other

## 2014-07-02 ENCOUNTER — Telehealth: Payer: Self-pay | Admitting: *Deleted

## 2014-07-02 NOTE — Telephone Encounter (Signed)
THE NIGHT OF 06/28/14 PT. BECAME DISORIENTED AND DIZZY AFTER TAKING THE ZYPREXA. SHE IS UNABLE TO TAKE THE ZYPREXA. PT. HAS ZOFRAN AND COMPAZINE TO TAKE WHICH HELPS SOME. HER DAUGHTER SUGGESTED IF PT. COULD TRY DICLEGIS OR IS THERE SOME OTHER MEDICATION WHICH MIGHT WORK BETTER.

## 2014-07-05 ENCOUNTER — Ambulatory Visit (INDEPENDENT_AMBULATORY_CARE_PROVIDER_SITE_OTHER): Payer: Medicare Other | Admitting: Cardiovascular Disease

## 2014-07-05 ENCOUNTER — Other Ambulatory Visit: Payer: Self-pay | Admitting: Physician Assistant

## 2014-07-05 ENCOUNTER — Other Ambulatory Visit (HOSPITAL_BASED_OUTPATIENT_CLINIC_OR_DEPARTMENT_OTHER): Payer: Medicare Other

## 2014-07-05 ENCOUNTER — Ambulatory Visit (HOSPITAL_BASED_OUTPATIENT_CLINIC_OR_DEPARTMENT_OTHER): Payer: Medicare Other

## 2014-07-05 ENCOUNTER — Encounter: Payer: Self-pay | Admitting: Cardiovascular Disease

## 2014-07-05 VITALS — BP 132/60 | HR 69 | Ht 65.5 in | Wt 179.8 lb

## 2014-07-05 VITALS — BP 119/59 | HR 62 | Temp 98.6°F | Resp 18

## 2014-07-05 DIAGNOSIS — C9000 Multiple myeloma not having achieved remission: Secondary | ICD-10-CM | POA: Diagnosis not present

## 2014-07-05 DIAGNOSIS — R42 Dizziness and giddiness: Secondary | ICD-10-CM

## 2014-07-05 LAB — COMPREHENSIVE METABOLIC PANEL (CC13)
ALT: 19 U/L (ref 0–55)
AST: 18 U/L (ref 5–34)
Albumin: 3.2 g/dL — ABNORMAL LOW (ref 3.5–5.0)
Alkaline Phosphatase: 116 U/L (ref 40–150)
Anion Gap: 9 mEq/L (ref 3–11)
BILIRUBIN TOTAL: 0.75 mg/dL (ref 0.20–1.20)
BUN: 31.4 mg/dL — ABNORMAL HIGH (ref 7.0–26.0)
CO2: 24 mEq/L (ref 22–29)
CREATININE: 3.3 mg/dL — AB (ref 0.6–1.1)
Calcium: 9.5 mg/dL (ref 8.4–10.4)
Chloride: 109 mEq/L (ref 98–109)
EGFR: 13 mL/min/{1.73_m2} — ABNORMAL LOW (ref >90)
GLUCOSE: 74 mg/dL (ref 70–140)
POTASSIUM: 4 meq/L (ref 3.5–5.1)
Sodium: 142 mEq/L (ref 136–145)
Total Protein: 5.2 g/dL — ABNORMAL LOW (ref 6.4–8.3)

## 2014-07-05 LAB — CBC WITH DIFFERENTIAL/PLATELET
BASO%: 0.5 % (ref 0.0–2.0)
Basophils Absolute: 0 10*3/uL (ref 0.0–0.1)
EOS%: 11.6 % — ABNORMAL HIGH (ref 0.0–7.0)
Eosinophils Absolute: 0.7 10*3/uL — ABNORMAL HIGH (ref 0.0–0.5)
HEMATOCRIT: 24.6 % — AB (ref 34.8–46.6)
HGB: 8 g/dL — ABNORMAL LOW (ref 11.6–15.9)
LYMPH#: 1.5 10*3/uL (ref 0.9–3.3)
LYMPH%: 23.8 % (ref 14.0–49.7)
MCH: 33.3 pg (ref 25.1–34.0)
MCHC: 32.5 g/dL (ref 31.5–36.0)
MCV: 102.5 fL — ABNORMAL HIGH (ref 79.5–101.0)
MONO#: 1.4 10*3/uL — AB (ref 0.1–0.9)
MONO%: 22.8 % — ABNORMAL HIGH (ref 0.0–14.0)
NEUT%: 41.3 % (ref 38.4–76.8)
NEUTROS ABS: 2.6 10*3/uL (ref 1.5–6.5)
Platelets: 53 10*3/uL — ABNORMAL LOW (ref 145–400)
RBC: 2.4 10*6/uL — ABNORMAL LOW (ref 3.70–5.45)
RDW: 18.8 % — ABNORMAL HIGH (ref 11.2–14.5)
WBC: 6.3 10*3/uL (ref 3.9–10.3)
nRBC: 0 % (ref 0–0)

## 2014-07-05 LAB — TECHNOLOGIST REVIEW

## 2014-07-05 MED ORDER — SODIUM CHLORIDE 0.9 % IV SOLN: Freq: Once | INTRAVENOUS | Status: DC

## 2014-07-05 MED ORDER — CARFILZOMIB CHEMO INJECTION 60 MG: 37.0000 mg/m2 | Freq: Once | INTRAVENOUS | Status: DC

## 2014-07-05 MED ORDER — HEPARIN SOD (PORK) LOCK FLUSH 100 UNIT/ML IV SOLN
500.0000 [IU] | Freq: Once | INTRAVENOUS | Status: AC | PRN
  Administered 2014-07-05: 500 [IU]
  Filled 2014-07-05: qty 5

## 2014-07-05 MED ORDER — SODIUM CHLORIDE 0.9 % IJ SOLN
10.0000 mL | INTRAMUSCULAR | Status: DC | PRN
Start: 2014-07-05 — End: 2014-07-05
  Administered 2014-07-05: 10 mL
  Filled 2014-07-05: qty 10

## 2014-07-05 MED ORDER — SODIUM CHLORIDE 0.9 % IV SOLN: 300.0000 mg/m2 | Freq: Once | INTRAVENOUS | Status: DC

## 2014-07-05 MED ORDER — HEPARIN SOD (PORK) LOCK FLUSH 100 UNIT/ML IV SOLN
500.0000 [IU] | INTRAVENOUS | Status: AC | PRN
  Administered 2014-07-05: 500 [IU]
  Filled 2014-07-05: qty 5

## 2014-07-05 MED ORDER — SODIUM CHLORIDE 0.9 % IV SOLN
Freq: Once | INTRAVENOUS | Status: AC
  Administered 2014-07-05: 12:00:00 via INTRAVENOUS

## 2014-07-05 NOTE — Progress Notes (Signed)
Patient will not receive treatment today or 07/06/14 per pharmacy due to low platelet count. Patient educated on thrombocytopenic precautions. Patient given Chemo Card with instructions to call the clinic with concerns or report to the ED if needed.

## 2014-07-05 NOTE — Patient Instructions (Signed)
Your physician wants you to follow-up in:  6 MONTHS WITH DR NISHAN  You will receive a reminder letter in the mail two months in advance. If you don't receive a letter, please call our office to schedule the follow-up appointment. Your physician recommends that you continue on your current medications as directed. Please refer to the Current Medication list given to you today. 

## 2014-07-06 ENCOUNTER — Other Ambulatory Visit: Payer: Self-pay | Admitting: *Deleted

## 2014-07-06 ENCOUNTER — Telehealth: Payer: Self-pay | Admitting: *Deleted

## 2014-07-06 ENCOUNTER — Ambulatory Visit: Payer: Medicare Other

## 2014-07-06 MED ORDER — PILOCARPINE HCL 5 MG PO TABS: 5.0000 mg | ORAL_TABLET | Freq: Three times a day (TID) | ORAL | Status: DC

## 2014-07-06 NOTE — Telephone Encounter (Signed)
Okay to prescribe it

## 2014-07-06 NOTE — Telephone Encounter (Signed)
Notified pt Rx for Salagen was sent to her pharmacy. Pt verbalized understanding no further concerns

## 2014-07-06 NOTE — Telephone Encounter (Signed)
PT.'S LIPS STICK TO HER TEETH. THE DRYNESS IS ALL THE WAY DOWN HER THROAT. A PHARMACIST SUGGESTED SALAGEN. SHE WOULD LIKE A PRESCRIPTION CALLED TO [facility] PHONE NUMBER [phone]. CALL PT. WHEN MEDICATION HAS BEEN CALLED OR SENT TO PHARMACY.

## 2014-07-08 ENCOUNTER — Telehealth: Payer: Self-pay

## 2014-07-08 ENCOUNTER — Ambulatory Visit: Payer: Medicare Other | Admitting: Internal Medicine

## 2014-07-08 DIAGNOSIS — R110 Nausea: Secondary | ICD-10-CM

## 2014-07-08 NOTE — Telephone Encounter (Signed)
Do you have another suggestion?

## 2014-07-08 NOTE — Telephone Encounter (Signed)
Pt called stating she called Tuesday asking for diclegis or something else for n/v. zyprexa put her out of her head, he could not tell R from L or up from down. Pt is still nauseated, she makes herself eat bid, she does not throw it up. Is able to drink fluids. She is forcing fluids. Food tastes like cardboard. Her daughter works for ob/gyn and suggests diclegis. Pt is wondering if this would help, or if you have a different suggestion.

## 2014-07-08 NOTE — Telephone Encounter (Signed)
Not familiar with the drug and will not be able to prescribe it

## 2014-07-09 MED ORDER — LORAZEPAM 0.5 MG PO TABS: 0.5000 mg | ORAL_TABLET | Freq: Three times a day (TID) | ORAL | Status: DC | PRN

## 2014-07-09 NOTE — Telephone Encounter (Signed)
Explained to pt that zyprexa is to treat mood conditions but also can be used to treat n/v from chemo. Pt laughed that she was not nauseated but the bathtub was falling though the ceiling while she was taking it. zyprexa is on allergy list now. Pt was willing to try ativan per Dr Ellan Lambert suggestion. Called into [facility].   We can start her on small dose of Ativan 0.5 mg tid PRN #30.    ----- Message -----     From: Janace Hoard, RN     Sent: 07/08/2014  5:04 PM      To: Curt Bears, MD

## 2014-07-09 NOTE — Addendum Note (Signed)
Addended by: Janace Hoard on: 07/09/2014 10:11 AM   Modules accepted: Orders

## 2014-07-11 ENCOUNTER — Encounter: Payer: Self-pay | Admitting: *Deleted

## 2014-07-12 ENCOUNTER — Encounter: Payer: Self-pay | Admitting: Oncology

## 2014-07-12 ENCOUNTER — Ambulatory Visit (HOSPITAL_BASED_OUTPATIENT_CLINIC_OR_DEPARTMENT_OTHER): Payer: Medicare Other | Admitting: Oncology

## 2014-07-12 ENCOUNTER — Ambulatory Visit (HOSPITAL_BASED_OUTPATIENT_CLINIC_OR_DEPARTMENT_OTHER): Payer: Medicare Other

## 2014-07-12 ENCOUNTER — Other Ambulatory Visit: Payer: Medicare Other

## 2014-07-12 ENCOUNTER — Other Ambulatory Visit (HOSPITAL_BASED_OUTPATIENT_CLINIC_OR_DEPARTMENT_OTHER): Payer: Medicare Other

## 2014-07-12 VITALS — BP 110/46 | HR 77 | Temp 98.3°F | Resp 18 | Ht 65.5 in | Wt 179.1 lb

## 2014-07-12 DIAGNOSIS — C9000 Multiple myeloma not having achieved remission: Secondary | ICD-10-CM

## 2014-07-12 DIAGNOSIS — R5383 Other fatigue: Secondary | ICD-10-CM | POA: Diagnosis not present

## 2014-07-12 DIAGNOSIS — C9002 Multiple myeloma in relapse: Secondary | ICD-10-CM

## 2014-07-12 DIAGNOSIS — D638 Anemia in other chronic diseases classified elsewhere: Secondary | ICD-10-CM | POA: Diagnosis not present

## 2014-07-12 DIAGNOSIS — Z5112 Encounter for antineoplastic immunotherapy: Secondary | ICD-10-CM | POA: Diagnosis not present

## 2014-07-12 LAB — CBC WITH DIFFERENTIAL/PLATELET
BASO%: 0.7 % (ref 0.0–2.0)
Basophils Absolute: 0 10*3/uL (ref 0.0–0.1)
EOS ABS: 0.3 10*3/uL (ref 0.0–0.5)
EOS%: 5.4 % (ref 0.0–7.0)
HEMATOCRIT: 25.9 % — AB (ref 34.8–46.6)
HEMOGLOBIN: 8.5 g/dL — AB (ref 11.6–15.9)
LYMPH#: 1.1 10*3/uL (ref 0.9–3.3)
LYMPH%: 21 % (ref 14.0–49.7)
MCH: 34.4 pg — ABNORMAL HIGH (ref 25.1–34.0)
MCHC: 32.7 g/dL (ref 31.5–36.0)
MCV: 104.9 fL — ABNORMAL HIGH (ref 79.5–101.0)
MONO#: 0.8 10*3/uL (ref 0.1–0.9)
MONO%: 14.6 % — ABNORMAL HIGH (ref 0.0–14.0)
NEUT%: 58.3 % (ref 38.4–76.8)
NEUTROS ABS: 3.1 10*3/uL (ref 1.5–6.5)
Platelets: 138 10*3/uL — ABNORMAL LOW (ref 145–400)
RBC: 2.47 10*6/uL — ABNORMAL LOW (ref 3.70–5.45)
RDW: 20.9 % — ABNORMAL HIGH (ref 11.2–14.5)
WBC: 5.3 10*3/uL (ref 3.9–10.3)

## 2014-07-12 LAB — COMPREHENSIVE METABOLIC PANEL (CC13)
ALT: 14 U/L (ref 0–55)
AST: 13 U/L (ref 5–34)
Albumin: 3.4 g/dL — ABNORMAL LOW (ref 3.5–5.0)
Alkaline Phosphatase: 102 U/L (ref 40–150)
Anion Gap: 9 meq/L (ref 3–11)
BUN: 24.2 mg/dL (ref 7.0–26.0)
CO2: 26 meq/L (ref 22–29)
Calcium: 8.5 mg/dL (ref 8.4–10.4)
Chloride: 109 meq/L (ref 98–109)
Creatinine: 3.3 mg/dL (ref 0.6–1.1)
EGFR: 13 ml/min/1.73 m2 — ABNORMAL LOW
Glucose: 104 mg/dL (ref 70–140)
Potassium: 3.4 meq/L — ABNORMAL LOW (ref 3.5–5.1)
Sodium: 144 meq/L (ref 136–145)
Total Bilirubin: 1.05 mg/dL (ref 0.20–1.20)
Total Protein: 5.4 g/dL — ABNORMAL LOW (ref 6.4–8.3)

## 2014-07-12 MED ORDER — SODIUM CHLORIDE 0.9 % IV SOLN
300.0000 mg/m2 | Freq: Once | INTRAVENOUS | Status: AC
  Administered 2014-07-12: 560 mg via INTRAVENOUS
  Filled 2014-07-12: qty 28

## 2014-07-12 MED ORDER — SODIUM CHLORIDE 0.9 % IV SOLN: Freq: Once | INTRAVENOUS | Status: DC

## 2014-07-12 MED ORDER — HEPARIN SOD (PORK) LOCK FLUSH 100 UNIT/ML IV SOLN
500.0000 [IU] | Freq: Once | INTRAVENOUS | Status: AC | PRN
  Administered 2014-07-12: 500 [IU]
  Filled 2014-07-12: qty 5

## 2014-07-12 MED ORDER — DEXAMETHASONE SODIUM PHOSPHATE 100 MG/10ML IJ SOLN
Freq: Once | INTRAMUSCULAR | Status: AC
  Administered 2014-07-12: 11:00:00 via INTRAVENOUS
  Filled 2014-07-12: qty 4

## 2014-07-12 MED ORDER — SODIUM CHLORIDE 0.9 % IJ SOLN
10.0000 mL | INTRAMUSCULAR | Status: DC | PRN
  Administered 2014-07-12: 10 mL
  Filled 2014-07-12: qty 10

## 2014-07-12 MED ORDER — SODIUM CHLORIDE 0.9 % IV SOLN
Freq: Once | INTRAVENOUS | Status: AC
  Administered 2014-07-12: 11:00:00 via INTRAVENOUS

## 2014-07-12 MED ORDER — DEXTROSE 5 % IV SOLN
37.0000 mg/m2 | Freq: Once | INTRAVENOUS | Status: AC
  Administered 2014-07-12: 70 mg via INTRAVENOUS
  Filled 2014-07-12: qty 35

## 2014-07-12 NOTE — Progress Notes (Signed)
OKay to treat per K. Curcio, NP with creat 3.3

## 2014-07-12 NOTE — Patient Instructions (Signed)
[facility] Discharge Instructions for Patients Receiving Chemotherapy  Today you received the following chemotherapy agents Kyprolis and Cytoxan.  To help prevent nausea and vomiting after your treatment, we encourage you to take your nausea medication Zofran '8mg'$  every 8 hours or Compazine 10 mg every 6 hour as needed   If you develop nausea and vomiting that is not controlled by your nausea medication, call the clinic.   BELOW ARE SYMPTOMS THAT SHOULD BE REPORTED IMMEDIATELY:  *FEVER GREATER THAN 100.5 F  *CHILLS WITH OR WITHOUT FEVER  NAUSEA AND VOMITING THAT IS NOT CONTROLLED WITH YOUR NAUSEA MEDICATION  *UNUSUAL SHORTNESS OF BREATH  *UNUSUAL BRUISING OR BLEEDING  TENDERNESS IN MOUTH AND THROAT WITH OR WITHOUT PRESENCE OF ULCERS  *URINARY PROBLEMS  *BOWEL PROBLEMS  UNUSUAL RASH Items with * indicate a potential emergency and should be followed up as soon as possible.  Feel free to call the clinic you have any questions or concerns. The clinic phone number is [phone].  Please show the [place] at check-in to the Emergency Department and triage nurse.

## 2014-07-12 NOTE — Progress Notes (Signed)
[facility]  Telephone:([phone]) [phone] [phone]) [phone]  OFFICE PROGRESS NOTE   PRINCIPAL DIAGNOSES:  1. Recurrent multiple myeloma initially diagnosed in 2002, initially with smoldering myeloma at [facility].  2. Ductal carcinoma in situ status post mastectomy with sentinel lymph node biopsy in October 2008.  PRIOR THERAPY:  1. Status post treatment with tamoxifen from November 2008 through February 2009, discontinued secondary to intolerance. 2. Status post 3 cycles of chemotherapy with Revlimid and Decadron followed by 1 cycle of Decadron only with mild response. 3. Status post 2 cycles of systemic chemotherapy with Velcade, Doxil and Decadron discontinued secondary to significant peripheral neuropathy. Last dose was given May 2010 at [facility]. 4. Status post autologous peripheral blood stem cell transplant on October 01, 2008 at [facility] under the care of Dr. Phyllis Ginger.  5. Systemic chemotherapy with Carfilzomib initially was 20 mg/M2 and will be increased after cycle #1 to 36 mg/M2 on days 1, 2, 8, 9, 15 and 16 every 4 weeks in addition to Cytoxan 300 mg/M2 and Decadron 40 mg by mouth weekly basis, status post 4 cycles. First cycle on 12/29/2012.    CURRENT THERAPY: She resumed chemotherapy with Carfilzomib, Cytoxan, and Decadron on 05/03/2014. Status post 2 cycles  INTERVAL HISTORY: Nancy Norris 74 y.o. female returns to the clinic today for followup visit. He continues to have fatigue and dyspnea. She was unable to tolerate the Zyprexa that she was given for nausea at her last visit. Now using Ativan at bedtime with improvement of her nausea. No vomiting. No fever or chills. The patient denied having any chest pain, cough or hemoptysis. Denies bleeding. No significant weight loss or night sweats. Her neuropathy is at baseline. She reports some trouble swallowing, but this is unchanged. Her edema has improved as she remains on  Lasix 40 mg daily. Her chemo was held last week due to thrombocytopenia. The patient is due for cycle 3 day 15 of her chemotherapy today.  MEDICAL HISTORY: Past Medical History  Diagnosis Date  . Hyperkalemia   . Hypothyroidism   . COPD (chronic obstructive pulmonary disease)   . Hyponatremia   . Dizziness   . Fibromyalgia   . Breast cancer   . Multiple myeloma   . Mucositis   . Hx of echocardiogram     a.  Echocardiogram (12/26/2012): EF 95-18%, grade 1 diastolic dysfunction;   b.  Echocardiogram (02/2013): EF 55-60%, no WMA, trivial effusion  . Fibromyalgia   . Arthritis   . Hx of cardiovascular stress test     LexiScan with low level exercise Myoview (02/2013): No ischemia, EF 72%; normal study  . Complication of anesthesia   . PONV (postoperative nausea and vomiting) 2008    after mastestomy  . Myocardial infarction     in past, patient was unaware.   . Anginal pain     used NTG x 2 May 31 and 06/15/13   . GERD (gastroesophageal reflux disease)   . Headache(784.0)   . Anxiety   . Depression   . CKD (chronic kidney disease) stage 3, GFR 30-59 ml/min   . Anemia   . History of blood transfusion     last one May 12   . Neuropathy     ALLERGIES:  is allergic to codeine; latex; other; onion; zyprexa; adhesive; iodinated diagnostic agents; and sulfa antibiotics.  MEDICATIONS:  Current Outpatient Prescriptions  Medication Sig Dispense Refill  . acetaminophen (TYLENOL) 325 MG tablet Take 650  mg by mouth every 6 (six) hours as needed for mild pain or moderate pain.     Marland Kitchen albuterol (PROVENTIL HFA;VENTOLIN HFA) 108 (90 BASE) MCG/ACT inhaler Inhale 2 puffs into the lungs 2 (two) times daily as needed for wheezing or shortness of breath. 1 each 6  . ascorbic acid (VITAMIN C) 250 MG CHEW Chew 250 mg by mouth daily.    Marland Kitchen aspirin EC 81 MG tablet Take 1 tablet (81 mg total) by mouth daily.    Marland Kitchen buPROPion (WELLBUTRIN XL) 300 MG 24 hr tablet Take 300 mg by mouth daily.   98  .  carboxymethylcellulose (REFRESH PLUS) 0.5 % SOLN Place 1 drop into both eyes every 2 (two) hours.    . Cholecalciferol 4000 UNITS TABS Take 4,000 Units by mouth daily.    . citalopram (CELEXA) 40 MG tablet TAKE 1 TABLET BY MOUTH ONCE DAILY. 90 tablet 1  . cromolyn (OPTICROM) 4 % ophthalmic solution Place 1 drop into both eyes 4 (four) times daily.    Marland Kitchen dexamethasone (DECADRON) 4 MG tablet Take 10 tablets by mouth once a week as directed 40 tablet 3  . esomeprazole (NEXIUM) 40 MG capsule TAKE 1 CAPSULE BY MOUTH DAILY, EACH MORNING FOR ACID INDIGESTION 30 capsule PRN  . Ferrous Sulfate 134 MG TABS Take 1 tablet (134 mg total) by mouth every other day. 60 tablet 2  . fludrocortisone (FLORINEF) 0.1 MG tablet TAKE 1 TO 2 TABLETS DAILY AS DIRECTED FOR LOW BLOOD PRESSURE. 60 tablet 1  . fluticasone (FLONASE) 50 MCG/ACT nasal spray Place 2 sprays into both nostrils at bedtime.     . furosemide (LASIX) 40 MG tablet Take 40 mg by mouth daily.    Marland Kitchen gabapentin (NEURONTIN) 300 MG capsule Take 1 capsule (300 mg total) by mouth at bedtime. 90 capsule 3  . isosorbide mononitrate (IMDUR) 30 MG 24 hr tablet TAKE 1 TABLET BY MOUTH DAILY. 30 tablet 11  . levothyroxine (SYNTHROID, LEVOTHROID) 150 MCG tablet TAKE 1 TABLET BY MOUTH ONCE DAILY. 90 tablet 1  . loratadine (CLARITIN) 10 MG tablet Take 10 mg by mouth daily.    Marland Kitchen LORazepam (ATIVAN) 0.5 MG tablet Take 1 tablet (0.5 mg total) by mouth every 8 (eight) hours as needed (nausea). 30 tablet 0  . loteprednol (ALREX) 0.2 % SUSP Place 1 drop into both eyes 4 (four) times daily. 1 Bottle PRN  . meclizine (ANTIVERT) 32 MG tablet Take 1 tablet (32 mg total) by mouth 3 (three) times daily as needed. 30 tablet 0  . midodrine (PROAMATINE) 10 MG tablet Take 1 tablet (10 mg total) by mouth 3 (three) times daily. 90 tablet 11  . nitroGLYCERIN (NITROSTAT) 0.4 MG SL tablet Place 1 tablet (0.4 mg total) under the tongue every 5 (five) minutes as needed for chest pain. 25 tablet 3    . nystatin (MYCOSTATIN/NYSTOP) 100000 UNIT/GM POWD Apply 1 g topically 2 (two) times daily. Apply to belly    . ondansetron (ZOFRAN-ODT) 8 MG disintegrating tablet Take 1 tablet (8 mg total) by mouth every 8 (eight) hours as needed for nausea or vomiting. 20 tablet 3  . pilocarpine (SALAGEN) 5 MG tablet Take 1 tablet (5 mg total) by mouth 3 (three) times daily. 90 tablet 0  . polyethylene glycol (MIRALAX / GLYCOLAX) packet Take 17 g by mouth daily.    . prochlorperazine (COMPAZINE) 10 MG tablet TAKE 1 TABLET BY MOUTH EVERY 6 HOURS AS NEEDED FOR NAUSEA OR VOMITING. 60 tablet 0  .  ranitidine (ZANTAC) 300 MG tablet TAKE 1 TABLET BY MOUTH AT BEDTIME, FOR ACID REFLUX AND INDIGESTION. 30 tablet PRN  . RESTASIS 0.05 % ophthalmic emulsion Place 1 drop into both eyes 2 (two) times daily.   4  . zolpidem (AMBIEN) 5 MG tablet TAKE 1 TABLET BY MOUTH AT BEDTIME AS NEEDED FOR SLEEP. 30 tablet 0   No current facility-administered medications for this visit.   Facility-Administered Medications Ordered in Other Visits  Medication Dose Route Frequency Provider Last Rate Last Dose  . 0.9 %  sodium chloride infusion   Intravenous Once Adrena E Johnson, PA-C      . 0.9 %  sodium chloride infusion   Intravenous Once Si Gaul, MD      . sodium chloride 0.9 % injection 10 mL  10 mL Intracatheter PRN Si Gaul, MD   10 mL at 01/05/13 1825  . sodium chloride 0.9 % injection 10 mL  10 mL Intracatheter PRN Si Gaul, MD   10 mL at 06/15/14 1514  . sodium chloride 0.9 % injection 10 mL  10 mL Intracatheter PRN Si Gaul, MD   10 mL at 07/12/14 1250    SURGICAL HISTORY:  Past Surgical History  Procedure Laterality Date  . History of port removal    . Status post stem cell transplant on September 28, 2008.    Marland Kitchen Abdominal hysterectomy  1981  . Cholecystectomy  1971  . Mastectomy Left 2008  . Cataract extraction, bilateral    . Breast surgery    . Eye surgery Bilateral     lens implant  .  Breast reconstruction    . Portacath placement  12/2012    has had 2  . Av fistula placement Left 06/19/2013    Procedure: CREATION OF LEFT ARM ARTERIOVENOUS (AV) FISTULA ;  Surgeon: Chuck Hint, MD;  Location: [facility] OR;  Service: Vascular;  Laterality: Left;    REVIEW OF SYSTEMS:  A comprehensive ROS was negative except as noted in HPI.   PHYSICAL EXAMINATION: General appearance: alert, cooperative and no distress Head: Normocephalic, without obvious abnormality, atraumatic Neck: no adenopathy Lymph nodes: Cervical, supraclavicular, and axillary nodes normal. Resp: clear to auscultation bilaterally Back: symmetric, no curvature. ROM normal. No CVA tenderness. Cardio: regular rate and rhythm, S1, S2 normal, no murmur, click, rub or gallop GI: soft, non-tender; bowel sounds normal; no masses,  no organomegaly Extremities: edema 1+ in the bilateral lower extremities. Neurologic: Alert and oriented X 3, normal strength and tone. Normal symmetric reflexes. Normal coordination and gait  ECOG PERFORMANCE STATUS: 1 - Symptomatic but completely ambulatory  Blood pressure 110/46, pulse 77, temperature 98.3 F (36.8 C), temperature source Oral, resp. rate 18, height 5' 5.5" (1.664 m), weight 179 lb 1.6 oz (81.239 kg), SpO2 99 %.  LABORATORY DATA: Lab Results  Component Value Date   WBC 5.3 07/12/2014   HGB 8.5* 07/12/2014   HCT 25.9* 07/12/2014   MCV 104.9* 07/12/2014   PLT 138* 07/12/2014      Chemistry      Component Value Date/Time   NA 144 07/12/2014 0857   NA 138 04/03/2014 0306   K 3.4* 07/12/2014 0857   K 3.3* 04/03/2014 0306   CL 110 04/03/2014 0306   CL 105 03/19/2012 0811   CO2 26 07/12/2014 0857   CO2 22 04/03/2014 0306   BUN 24.2 07/12/2014 0857   BUN 33* 04/03/2014 0306   CREATININE 3.3* 07/12/2014 0857   CREATININE 4.29* 04/03/2014 0306  CREATININE 3.87* 03/23/2014 1558      Component Value Date/Time   CALCIUM 8.5 07/12/2014 0857   CALCIUM 8.3*  04/03/2014 0306   ALKPHOS 102 07/12/2014 0857   ALKPHOS 71 04/03/2014 0306   AST 13 07/12/2014 0857   AST 20 04/03/2014 0306   ALT 14 07/12/2014 0857   ALT 26 04/03/2014 0306   BILITOT 1.05 07/12/2014 0857   BILITOT 0.7 04/03/2014 0306     Other lab results: Beta-2 microglobulin 9.07, free kappa light chain 1.32, free lambda light chain 0.16, kappa/lambda ratio 8.25. IgG 358 IgA <6 and IgM 8    RADIOGRAPHIC STUDIES:  ASSESSMENT AND PLAN: This is a very pleasant 74 year old white female with relapsed multiple myeloma. She is currently undergoing systemic chemotherapy with Carfilzomib, Cytoxan and dexamethasone status post 4 cycles. She was placed on observation but after consulting with her nephrologist she resumed treatment for her multiple myeloma.   Patient seen and examined with Dr. Julien Nordmann. Labs reviewed. Her creatinine is stable at 3.3 today. Recommend that she proceed with cycle 3 day 15 of her chemotherapy today. She will have a week off next week.  She is scheduled back in 2 weeks prior to starting cycle 4.  She was advised to call immediately if she has any concerning symptoms in the interval.   Mikey Bussing, NP 07/12/2014  ADDENDUM: Hematology/Oncology Attending:  I had a face to face encounter with the patient. I recommended her care plan.  This is a very pleasant 74 years old white female with relapsed multiple myeloma is currently undergoing treatment with Carfilzomib, Cytoxan and Decadron status post 2 cycles and she is currently undergoing cycle #3 of the second course of this regimen.  The patient is rating her treatment well except for the increasing fatigue secondary to anemia of chronic disease.  I recommended for the patient to continue her treatment as scheduled.  For the anemia of chronic disease, we will arrange for the patient to receive 2 units of PRBCs transfusion this week.  She would come back for follow-up visit in 2 weeks for reevaluation.  The  patient was advised to call immediately if she has any concerning symptoms in the interval.  Disclaimer: This note was dictated with voice recognition software. Similar sounding words can inadvertently be transcribed and may be missed upon review. [name]., MD 07/14/2014

## 2014-07-13 ENCOUNTER — Ambulatory Visit (HOSPITAL_BASED_OUTPATIENT_CLINIC_OR_DEPARTMENT_OTHER): Payer: Medicare Other

## 2014-07-13 ENCOUNTER — Other Ambulatory Visit: Payer: Self-pay

## 2014-07-13 ENCOUNTER — Other Ambulatory Visit (HOSPITAL_COMMUNITY)
Admission: RE | Admit: 2014-07-13 | Discharge: 2014-07-13 | Disposition: A | Discharge status: Home / Self Care | Payer: Medicare Other | Source: Ambulatory Visit | Attending: Internal Medicine | Admitting: Internal Medicine

## 2014-07-13 ENCOUNTER — Ambulatory Visit (HOSPITAL_COMMUNITY): Admission: RE | Admit: 2014-07-13 | Payer: Medicare Other | Source: Ambulatory Visit

## 2014-07-13 ENCOUNTER — Ambulatory Visit (HOSPITAL_BASED_OUTPATIENT_CLINIC_OR_DEPARTMENT_OTHER): Payer: Medicare Other | Admitting: Nurse Practitioner

## 2014-07-13 ENCOUNTER — Ambulatory Visit (HOSPITAL_COMMUNITY)
Admission: RE | Admit: 2014-07-13 | Discharge: 2014-07-13 | Disposition: A | Discharge status: Home / Self Care | Payer: Medicare Other | Source: Ambulatory Visit | Attending: Nurse Practitioner | Admitting: Nurse Practitioner

## 2014-07-13 ENCOUNTER — Ambulatory Visit: Payer: Medicare Other | Admitting: Nutrition

## 2014-07-13 VITALS — BP 93/44 | HR 75 | Temp 98.9°F | Resp 18

## 2014-07-13 DIAGNOSIS — R0602 Shortness of breath: Secondary | ICD-10-CM | POA: Insufficient documentation

## 2014-07-13 DIAGNOSIS — D649 Anemia, unspecified: Secondary | ICD-10-CM | POA: Diagnosis not present

## 2014-07-13 DIAGNOSIS — C9000 Multiple myeloma not having achieved remission: Secondary | ICD-10-CM | POA: Diagnosis not present

## 2014-07-13 DIAGNOSIS — T451X5A Adverse effect of antineoplastic and immunosuppressive drugs, initial encounter: Secondary | ICD-10-CM | POA: Diagnosis not present

## 2014-07-13 DIAGNOSIS — I509 Heart failure, unspecified: Secondary | ICD-10-CM

## 2014-07-13 DIAGNOSIS — R0600 Dyspnea, unspecified: Secondary | ICD-10-CM

## 2014-07-13 DIAGNOSIS — D6481 Anemia due to antineoplastic chemotherapy: Secondary | ICD-10-CM | POA: Diagnosis not present

## 2014-07-13 DIAGNOSIS — R0789 Other chest pain: Secondary | ICD-10-CM | POA: Diagnosis not present

## 2014-07-13 DIAGNOSIS — Z95828 Presence of other vascular implants and grafts: Secondary | ICD-10-CM

## 2014-07-13 LAB — CBC WITH DIFFERENTIAL/PLATELET
BASO%: 0.1 % (ref 0.0–2.0)
BASOS ABS: 0 10*3/uL (ref 0.0–0.1)
EOS%: 3.9 % (ref 0.0–7.0)
Eosinophils Absolute: 0.3 10*3/uL (ref 0.0–0.5)
HEMATOCRIT: 23.3 % — AB (ref 34.8–46.6)
HEMOGLOBIN: 7.4 g/dL — AB (ref 11.6–15.9)
LYMPH%: 10.5 % — ABNORMAL LOW (ref 14.0–49.7)
MCH: 33.9 pg (ref 25.1–34.0)
MCHC: 31.8 g/dL (ref 31.5–36.0)
MCV: 106.9 fL — ABNORMAL HIGH (ref 79.5–101.0)
MONO#: 0.8 10*3/uL (ref 0.1–0.9)
MONO%: 12.3 % (ref 0.0–14.0)
NEUT#: 4.9 10*3/uL (ref 1.5–6.5)
NEUT%: 73.2 % (ref 38.4–76.8)
PLATELETS: 95 10*3/uL — AB (ref 145–400)
RBC: 2.18 10*6/uL — ABNORMAL LOW (ref 3.70–5.45)
RDW: 19.8 % — ABNORMAL HIGH (ref 11.2–14.5)
WBC: 6.8 10*3/uL (ref 3.9–10.3)
lymph#: 0.7 10*3/uL — ABNORMAL LOW (ref 0.9–3.3)
nRBC: 0 % (ref 0–0)

## 2014-07-13 LAB — HOLD TUBE, BLOOD BANK

## 2014-07-13 LAB — BRAIN NATRIURETIC PEPTIDE: B NATRIURETIC PEPTIDE 5: 968.2 pg/mL — AB (ref 0.0–100.0)

## 2014-07-13 LAB — PREPARE RBC (CROSSMATCH)

## 2014-07-13 MED ORDER — DIPHENHYDRAMINE HCL 25 MG PO CAPS
25.0000 mg | ORAL_CAPSULE | Freq: Once | ORAL | Status: AC
  Administered 2014-07-13: 25 mg via ORAL

## 2014-07-13 MED ORDER — SODIUM CHLORIDE 0.9 % IJ SOLN
10.0000 mL | INTRAMUSCULAR | Status: DC | PRN
Start: 2014-07-13 — End: 2014-07-15
  Administered 2014-07-13 (×2): 10 mL via INTRAVENOUS
  Filled 2014-07-13: qty 10

## 2014-07-13 MED ORDER — ACETAMINOPHEN 325 MG PO TABS
ORAL_TABLET | ORAL | Status: AC
  Filled 2014-07-13: qty 2

## 2014-07-13 MED ORDER — HEPARIN SOD (PORK) LOCK FLUSH 100 UNIT/ML IV SOLN
500.0000 [IU] | Freq: Every day | INTRAVENOUS | Status: DC | PRN
  Filled 2014-07-13: qty 5

## 2014-07-13 MED ORDER — SODIUM CHLORIDE 0.9 % IJ SOLN
10.0000 mL | INTRAMUSCULAR | Status: DC | PRN
  Filled 2014-07-13: qty 10

## 2014-07-13 MED ORDER — SODIUM CHLORIDE 0.9 % IV SOLN
250.0000 mL | Freq: Once | INTRAVENOUS | Status: AC
  Administered 2014-07-13: 250 mL via INTRAVENOUS

## 2014-07-13 MED ORDER — ALBUTEROL SULFATE (2.5 MG/3ML) 0.083% IN NEBU
2.5000 mg | INHALATION_SOLUTION | Freq: Once | RESPIRATORY_TRACT | Status: AC
  Administered 2014-07-13: 2.5 mg via RESPIRATORY_TRACT
  Filled 2014-07-13: qty 3

## 2014-07-13 MED ORDER — DIPHENHYDRAMINE HCL 25 MG PO CAPS
ORAL_CAPSULE | ORAL | Status: AC
  Filled 2014-07-13: qty 1

## 2014-07-13 MED ORDER — ACETAMINOPHEN 325 MG PO TABS
650.0000 mg | ORAL_TABLET | Freq: Once | ORAL | Status: AC
  Administered 2014-07-13: 650 mg via ORAL

## 2014-07-13 MED ORDER — HEPARIN SOD (PORK) LOCK FLUSH 100 UNIT/ML IV SOLN
500.0000 [IU] | Freq: Once | INTRAVENOUS | Status: AC
  Administered 2014-07-13: 500 [IU] via INTRAVENOUS
  Filled 2014-07-13: qty 5

## 2014-07-13 MED ORDER — ALBUTEROL SULFATE (2.5 MG/3ML) 0.083% IN NEBU
INHALATION_SOLUTION | RESPIRATORY_TRACT | Status: AC
  Filled 2014-07-13: qty 3

## 2014-07-13 MED ORDER — FUROSEMIDE 10 MG/ML IJ SOLN
INTRAMUSCULAR | Status: AC
  Filled 2014-07-13: qty 2

## 2014-07-13 MED ORDER — FUROSEMIDE 10 MG/ML IJ SOLN
20.0000 mg | Freq: Once | INTRAMUSCULAR | Status: AC
  Administered 2014-07-13: 20 mg via INTRAVENOUS

## 2014-07-13 NOTE — Progress Notes (Signed)
Nutrition follow-up completed with patient in the infusion room.  Patient receives treatment for multiple myeloma. Current weight documented as 179.1 pounds June 27. Patient states she continues on Lasix, however fluid retention has improved. Patient continues to have trouble swallowing but takes small bites and chews well. Potassium noted to be 3.4.  Patient has requested a list of high potassium foods. Creatinine remains at 3.3. Patient unable to tolerate oral nutrition supplements.  States she vomited after drinking.  Nutrition diagnosis: Food and nutrition related knowledge deficit continues.  Intervention: Educated patient to continue small frequent meals and snacks. Emphasized importance of adequate protein. Provided fact sheet on foods to include which are high in potassium. Questions answered and teach back method used.  Monitoring, evaluation, goals: Patient will tolerate adequate calories and protein to maintain lean body mass.  Next visit: To be scheduled as needed.  **Disclaimer: This note was dictated with voice recognition software. Similar sounding words can inadvertently be transcribed and this note may contain transcription errors which may not have been corrected upon publication of note.**

## 2014-07-13 NOTE — Patient Instructions (Signed)

## 2014-07-13 NOTE — Progress Notes (Signed)
[name], NP returns to chair to discuss plan after discussion with Dr. Julien Nordmann. Patient to go to radiology for chest Xray at this time, then return to [facility] for labs (BMP and T/C for possible transfusion) and possible breathing treatment pending chest xray. Patient transported to radiology per Enid Derry, LPN via wheelchair on 2 L o2 NCwith stable VS. She is aware of the plan and verbalizes understanding.

## 2014-07-13 NOTE — Progress Notes (Signed)
Patient with increased shortness of breath since this am, c/o chest tightness, 8/10 when she walked in this am, currently 4/10. Vs, temp 100.6, recheck of 100. O2 sat 90 on room air but decreases to 88 when talking, placed on 2L O2 and sat at 98-100. Patient states that she has been using her Pro air inhaler since this am. EKG done. [name] here to see patient.

## 2014-07-14 ENCOUNTER — Other Ambulatory Visit: Payer: Self-pay | Admitting: *Deleted

## 2014-07-14 ENCOUNTER — Ambulatory Visit (HOSPITAL_BASED_OUTPATIENT_CLINIC_OR_DEPARTMENT_OTHER): Payer: Medicare Other

## 2014-07-14 VITALS — BP 105/50 | HR 74 | Temp 98.2°F | Resp 18

## 2014-07-14 DIAGNOSIS — D649 Anemia, unspecified: Secondary | ICD-10-CM | POA: Diagnosis not present

## 2014-07-14 DIAGNOSIS — D6481 Anemia due to antineoplastic chemotherapy: Secondary | ICD-10-CM | POA: Diagnosis not present

## 2014-07-14 DIAGNOSIS — Z95828 Presence of other vascular implants and grafts: Secondary | ICD-10-CM

## 2014-07-14 DIAGNOSIS — C9000 Multiple myeloma not having achieved remission: Secondary | ICD-10-CM

## 2014-07-14 DIAGNOSIS — R0600 Dyspnea, unspecified: Secondary | ICD-10-CM | POA: Diagnosis not present

## 2014-07-14 DIAGNOSIS — T451X5A Adverse effect of antineoplastic and immunosuppressive drugs, initial encounter: Secondary | ICD-10-CM | POA: Diagnosis not present

## 2014-07-14 MED ORDER — DIPHENHYDRAMINE HCL 25 MG PO CAPS
25.0000 mg | ORAL_CAPSULE | Freq: Once | ORAL | Status: AC
  Administered 2014-07-14: 25 mg via ORAL

## 2014-07-14 MED ORDER — FUROSEMIDE 10 MG/ML IJ SOLN
INTRAMUSCULAR | Status: AC
  Filled 2014-07-14: qty 2

## 2014-07-14 MED ORDER — ACETAMINOPHEN 325 MG PO TABS
ORAL_TABLET | ORAL | Status: AC
  Filled 2014-07-14: qty 2

## 2014-07-14 MED ORDER — ACETAMINOPHEN 325 MG PO TABS
650.0000 mg | ORAL_TABLET | Freq: Once | ORAL | Status: AC
  Administered 2014-07-14: 650 mg via ORAL

## 2014-07-14 MED ORDER — FUROSEMIDE 10 MG/ML IJ SOLN
20.0000 mg | Freq: Once | INTRAMUSCULAR | Status: AC
  Administered 2014-07-14: 20 mg via INTRAVENOUS

## 2014-07-14 MED ORDER — HEPARIN SOD (PORK) LOCK FLUSH 100 UNIT/ML IV SOLN
500.0000 [IU] | Freq: Once | INTRAVENOUS | Status: AC
  Administered 2014-07-14: 500 [IU] via INTRAVENOUS
  Filled 2014-07-14: qty 5

## 2014-07-14 MED ORDER — SODIUM CHLORIDE 0.9 % IV SOLN
Freq: Once | INTRAVENOUS | Status: AC
  Administered 2014-07-14: 12:00:00 via INTRAVENOUS

## 2014-07-14 MED ORDER — FUROSEMIDE 40 MG PO TABS: 40.0000 mg | ORAL_TABLET | Freq: Every day | ORAL | Status: DC

## 2014-07-14 MED ORDER — DIPHENHYDRAMINE HCL 25 MG PO CAPS
ORAL_CAPSULE | ORAL | Status: AC
  Filled 2014-07-14: qty 1

## 2014-07-14 MED ORDER — SODIUM CHLORIDE 0.9 % IJ SOLN
10.0000 mL | INTRAMUSCULAR | Status: DC | PRN
  Administered 2014-07-14: 10 mL via INTRAVENOUS
  Filled 2014-07-14: qty 10

## 2014-07-14 NOTE — Patient Instructions (Signed)

## 2014-07-15 ENCOUNTER — Telehealth: Payer: Self-pay | Admitting: *Deleted

## 2014-07-15 ENCOUNTER — Telehealth: Payer: Self-pay | Admitting: Cardiovascular Disease

## 2014-07-15 ENCOUNTER — Encounter: Payer: Self-pay | Admitting: Nurse Practitioner

## 2014-07-15 ENCOUNTER — Telehealth: Payer: Self-pay | Admitting: Nurse Practitioner

## 2014-07-15 LAB — TYPE AND SCREEN
ABO/RH(D): O POS
ANTIBODY SCREEN: NEGATIVE
UNIT DIVISION: 0
UNIT DIVISION: 0

## 2014-07-15 NOTE — Assessment & Plan Note (Signed)
Patient received cycle 3, day 15 of his Kyprolis/Cytoxan chemotherapy yesterday, 07/12/2014.  White count remains fairly stable; but hemoglobin down to 7.4 and platelet count down to 95.  Patient will receive 2 units packed red blood cell transfusion today.  Patient's planned chemotherapy scheduled for today will be placed on hold.  Advice patient would consult with Dr. Julien Nordmann regarding plan for returning to the [facility] and let her know.

## 2014-07-15 NOTE — Telephone Encounter (Signed)
New message    Patient had chest xray complete on 6.28.2016 . Impression for mild CHF.

## 2014-07-15 NOTE — Assessment & Plan Note (Addendum)
Patient presented to the [facility] today with complaints of increased shortness of breath, mild chest tightness, and temperature to maximum 100.6 overnight.  She complains of an occasional dry, nonproductive cough only.  She has been using her albuterol inhaler at home with only minimal effectiveness.  On exam.-Patient does appear mildly short of breath.  Bilateral breath sounds slightly decreased; but no obvious wheezing or coughing.  Bilateral lower extremities with +2 to +3 edema.  O2 sats on room air between 88 and 90%.  Patient was placed on O2 via nasal cannula at 2 L with O2 sats in the mid 90s.  BNP drawn today.  Elevated to 968.  Chest x-ray obtained today revealed a bilateral diffuse interstitial thickening.  Small right pleural effusion.  No left pleural effusion.  Findings concerning for mild CHF.  EKG obtained today revealed a normal sinus rhythm with a rate of 87 and a QTC of 438.  Patient has been taking Lasix 40 mg every morning on a regular basis due to her chronic peripheral edema.  Patient is scheduled to receive 1 unit blood transfusion today; and another unit of blood tomorrow due to hemoglobin down to 7.4.  Patient was advised to continue with her Lasix 40 mg on a daily basis; and will also give Lasix 20 mg IV after each unit of blood.  Patient was advised to elevate her legs above the level of for heart as often as possible; and also to remain as active as possible.  Patient was also encouraged to follow-up with both her primary care physician and her cardiologist Dr. Liliane Channel to discuss further evaluation and management of CHF issues.

## 2014-07-15 NOTE — Telephone Encounter (Signed)
Lft msg for pt confirming labs/ov added per 06/30 POF, sent msg to add chemo..... KJ mailed out schedule

## 2014-07-15 NOTE — Telephone Encounter (Signed)
FYI    07/12/14 Per pt she has new on set CHF.    Pt wants  Dr [name] to know.

## 2014-07-15 NOTE — Telephone Encounter (Signed)
Message routed to Dr [name] that patient wants him to know that she has new onset of CHF

## 2014-07-15 NOTE — Telephone Encounter (Signed)
Called back to Devonne Doughty, with [facility] but they didn't know where to send me when called the operator.  I told her NP name seen in pt chart but was unable to leave a message.

## 2014-07-15 NOTE — Progress Notes (Signed)
SYMPTOM MANAGEMENT CLINIC   HPI: Nancy Norris 74 y.o. female diagnosed with multiple myeloma.  Currently undergoing Kyprolis/Cytoxan chemotherapy therapy regimen.  Patient presented to the [facility] today with complaints of increased shortness of breath, mild chest tightness, and temperature to maximum 100.6 overnight.  She complains of an occasional dry, nonproductive cough only.  She has been using her albuterol inhaler at home with only minimal effectiveness.   Shortness of Breath  Anemia    Review of Systems  Respiratory: Positive for shortness of breath.     Past Medical History  Diagnosis Date  . Hyperkalemia   . Hypothyroidism   . COPD (chronic obstructive pulmonary disease)   . Hyponatremia   . Dizziness   . Fibromyalgia   . Breast cancer   . Multiple myeloma   . Mucositis   . Hx of echocardiogram     a.  Echocardiogram (12/26/2012): EF 51-70%, grade 1 diastolic dysfunction;   b.  Echocardiogram (02/2013): EF 55-60%, no WMA, trivial effusion  . Fibromyalgia   . Arthritis   . Hx of cardiovascular stress test     LexiScan with low level exercise Myoview (02/2013): No ischemia, EF 72%; normal study  . Complication of anesthesia   . PONV (postoperative nausea and vomiting) 2008    after mastestomy  . Myocardial infarction     in past, patient was unaware.   . Anginal pain     used NTG x 2 May 31 and 06/15/13   . GERD (gastroesophageal reflux disease)   . Headache(784.0)   . Anxiety   . Depression   . CKD (chronic kidney disease) stage 3, GFR 30-59 ml/min   . Anemia   . History of blood transfusion     last one May 12   . Neuropathy     Past Surgical History  Procedure Laterality Date  . History of port removal    . Status post stem cell transplant on September 28, 2008.    Marland Kitchen Abdominal hysterectomy  1981  . Cholecystectomy  1971  . Mastectomy Left 2008  . Cataract extraction, bilateral    . Breast surgery    . Eye surgery Bilateral     lens  implant  . Breast reconstruction    . Portacath placement  12/2012    has had 2  . Av fistula placement Left 06/19/2013    Procedure: CREATION OF LEFT ARM ARTERIOVENOUS (AV) FISTULA ;  Surgeon: Angelia Mould, MD;  Location: [facility];  Service: Vascular;  Laterality: Left;    has Hypercalcemia; HX: breast cancer; Asthma, chronic; Hypothyroid; Chronic diastolic heart failure; HTN (hypertension); GERD (gastroesophageal reflux disease); COPD (chronic obstructive pulmonary disease) with emphysema; Fibromyalgia; Mixed hyperlipidemia; Prediabetes; Vitamin D deficiency; Medication management; Depression, controlled; Multiple myeloma; Fatigue; Orthostatic hypotension; CKD (chronic kidney disease) stage 5, GFR less than 15 ml/min; Anemia; Nausea with vomiting; Renal insufficiency; Dehydration; Hypoalbuminemia; Encounter for antineoplastic chemotherapy; and CHF (congestive heart failure) on her problem list.    is allergic to codeine; latex; other; onion; zyprexa; adhesive; iodinated diagnostic agents; and sulfa antibiotics.    Medication List       This list is accurate as of: 07/13/14 11:59 PM.  Always use your most recent med list.               acetaminophen 325 MG tablet  Commonly known as:  TYLENOL  Take 650 mg by mouth every 6 (six) hours as needed for mild pain or moderate pain.  albuterol 108 (90 BASE) MCG/ACT inhaler  Commonly known as:  PROVENTIL HFA;VENTOLIN HFA  Inhale 2 puffs into the lungs 2 (two) times daily as needed for wheezing or shortness of breath.     ascorbic acid 250 MG Chew  Commonly known as:  VITAMIN C  Chew 250 mg by mouth daily.     aspirin EC 81 MG tablet  Take 1 tablet (81 mg total) by mouth daily.     buPROPion 300 MG 24 hr tablet  Commonly known as:  WELLBUTRIN XL  Take 300 mg by mouth daily.     carboxymethylcellulose 0.5 % Soln  Commonly known as:  REFRESH PLUS  Place 1 drop into both eyes every 2 (two) hours.     Cholecalciferol 4000 UNITS  Tabs  Take 4,000 Units by mouth daily.     citalopram 40 MG tablet  Commonly known as:  CELEXA  TAKE 1 TABLET BY MOUTH ONCE DAILY.     cromolyn 4 % ophthalmic solution  Commonly known as:  OPTICROM  Place 1 drop into both eyes 4 (four) times daily.     dexamethasone 4 MG tablet  Commonly known as:  DECADRON  Take 10 tablets by mouth once a week as directed     esomeprazole 40 MG capsule  Commonly known as:  NEXIUM  TAKE 1 CAPSULE BY MOUTH DAILY, EACH MORNING FOR ACID INDIGESTION     Ferrous Sulfate 134 MG Tabs  Take 1 tablet (134 mg total) by mouth every other day.     fludrocortisone 0.1 MG tablet  Commonly known as:  FLORINEF  TAKE 1 TO 2 TABLETS DAILY AS DIRECTED FOR LOW BLOOD PRESSURE.     fluticasone 50 MCG/ACT nasal spray  Commonly known as:  FLONASE  Place 2 sprays into both nostrils at bedtime.     furosemide 40 MG tablet  Commonly known as:  LASIX  Take 1 tablet (40 mg total) by mouth daily.     gabapentin 300 MG capsule  Commonly known as:  NEURONTIN  Take 1 capsule (300 mg total) by mouth at bedtime.     isosorbide mononitrate 30 MG 24 hr tablet  Commonly known as:  IMDUR  TAKE 1 TABLET BY MOUTH DAILY.     levothyroxine 150 MCG tablet  Commonly known as:  SYNTHROID, LEVOTHROID  TAKE 1 TABLET BY MOUTH ONCE DAILY.     loratadine 10 MG tablet  Commonly known as:  CLARITIN  Take 10 mg by mouth daily.     LORazepam 0.5 MG tablet  Commonly known as:  ATIVAN  Take 1 tablet (0.5 mg total) by mouth every 8 (eight) hours as needed (nausea).     loteprednol 0.2 % Susp  Commonly known as:  ALREX  Place 1 drop into both eyes 4 (four) times daily.     meclizine 32 MG tablet  Commonly known as:  ANTIVERT  Take 1 tablet (32 mg total) by mouth 3 (three) times daily as needed.     midodrine 10 MG tablet  Commonly known as:  PROAMATINE  Take 1 tablet (10 mg total) by mouth 3 (three) times daily.     nitroGLYCERIN 0.4 MG SL tablet  Commonly known as:   NITROSTAT  Place 1 tablet (0.4 mg total) under the tongue every 5 (five) minutes as needed for chest pain.     nystatin 100000 UNIT/GM Powd  Apply 1 g topically 2 (two) times daily. Apply to belly     ondansetron  8 MG disintegrating tablet  Commonly known as:  ZOFRAN-ODT  Take 1 tablet (8 mg total) by mouth every 8 (eight) hours as needed for nausea or vomiting.     pilocarpine 5 MG tablet  Commonly known as:  SALAGEN  Take 1 tablet (5 mg total) by mouth 3 (three) times daily.     polyethylene glycol packet  Commonly known as:  MIRALAX / GLYCOLAX  Take 17 g by mouth daily.     prochlorperazine 10 MG tablet  Commonly known as:  COMPAZINE  TAKE 1 TABLET BY MOUTH EVERY 6 HOURS AS NEEDED FOR NAUSEA OR VOMITING.     ranitidine 300 MG tablet  Commonly known as:  ZANTAC  TAKE 1 TABLET BY MOUTH AT BEDTIME, FOR ACID REFLUX AND INDIGESTION.     RESTASIS 0.05 % ophthalmic emulsion  Generic drug:  cycloSPORINE  Place 1 drop into both eyes 2 (two) times daily.     zolpidem 5 MG tablet  Commonly known as:  AMBIEN  TAKE 1 TABLET BY MOUTH AT BEDTIME AS NEEDED FOR SLEEP.         PHYSICAL EXAMINATION  Oncology Vitals 07/14/2014 07/14/2014 07/14/2014 07/14/2014 07/14/2014 07/13/2014 07/13/2014  Height - - - - - - -  Weight - - - - - - -  Weight (lbs) - - - - - - -  BMI (kg/m2) - - - - - - -  Temp 98.2 98.7 - 98.5 99.3 98.9 99  Pulse 74 75 - 75 79 75 55  Resp 18 20 - $Re'22 17 18 18  'nqY$ Resp (Historical as of 08/16/11) - - - - - - -  SpO2 - - 93 93 94 93 93  BSA (m2) - - - - - - -   BP Readings from Last 3 Encounters:  07/14/14 105/50  07/13/14 93/44  07/12/14 110/46    Physical Exam  Constitutional: She is oriented to person, place, and time and well-developed, well-nourished, and in no distress.  HENT:  Head: Normocephalic and atraumatic.  Mouth/Throat: Oropharynx is clear and moist.  Eyes: Conjunctivae and EOM are normal. Pupils are equal, round, and reactive to light. Right eye exhibits  no discharge. Left eye exhibits no discharge. No scleral icterus.  Neck: Normal range of motion. Neck supple. No JVD present. No tracheal deviation present. No thyromegaly present.  Cardiovascular: Normal rate, regular rhythm, normal heart sounds and intact distal pulses.   Pulmonary/Chest: Breath sounds normal. No stridor. No respiratory distress. She has no wheezes. She has no rales. She exhibits no tenderness.  Bilateral lungs essentially clear; no wheezing or cough noted.  Does appear mildly short of breath with any type of exertion whatsoever.  Abdominal: Soft. Bowel sounds are normal. She exhibits no distension and no mass. There is no tenderness. There is no rebound and no guarding.  Musculoskeletal: Normal range of motion. She exhibits edema. She exhibits no tenderness.  +2-3 peripheral edema to bilateral lower extremities.  Lymphadenopathy:    She has no cervical adenopathy.  Neurological: She is alert and oriented to person, place, and time. Gait normal.  Skin: Skin is warm and dry. No rash noted. No erythema. No pallor.  Psychiatric: Affect normal.  Nursing note and vitals reviewed.   LABORATORY DATA:. Appointment on 07/13/2014  Component Date Value Ref Range Status  . WBC 07/13/2014 6.8  3.9 - 10.3 10e3/uL Final  . NEUT# 07/13/2014 4.9  1.5 - 6.5 10e3/uL Final  . HGB 07/13/2014 7.4* 11.6 - 15.9 g/dL  Final  . HCT 07/13/2014 23.3* 34.8 - 46.6 % Final  . Platelets 07/13/2014 95* 145 - 400 10e3/uL Final  . MCV 07/13/2014 106.9* 79.5 - 101.0 fL Final  . MCH 07/13/2014 33.9  25.1 - 34.0 pg Final  . MCHC 07/13/2014 31.8  31.5 - 36.0 g/dL Final  . RBC 07/13/2014 2.18* 3.70 - 5.45 10e6/uL Final  . RDW 07/13/2014 19.8* 11.2 - 14.5 % Final  . lymph# 07/13/2014 0.7* 0.9 - 3.3 10e3/uL Final  . MONO# 07/13/2014 0.8  0.1 - 0.9 10e3/uL Final  . Eosinophils Absolute 07/13/2014 0.3  0.0 - 0.5 10e3/uL Final  . Basophils Absolute 07/13/2014 0.0  0.0 - 0.1 10e3/uL Final  . NEUT% 07/13/2014  73.2  38.4 - 76.8 % Final  . LYMPH% 07/13/2014 10.5* 14.0 - 49.7 % Final  . MONO% 07/13/2014 12.3  0.0 - 14.0 % Final  . EOS% 07/13/2014 3.9  0.0 - 7.0 % Final  . BASO% 07/13/2014 0.1  0.0 - 2.0 % Final  . nRBC 07/13/2014 0  0 - 0 % Final  . Hold Tube, Blood Bank 07/13/2014 Type and Crossmatch Added   Final  Hospital Outpatient Visit on 07/13/2014  Component Date Value Ref Range Status  . B Natriuretic Peptide 07/13/2014 968.2* 0.0 - 100.0 pg/mL Final  Appointment on 07/12/2014  Component Date Value Ref Range Status  . WBC 07/12/2014 5.3  3.9 - 10.3 10e3/uL Final  . NEUT# 07/12/2014 3.1  1.5 - 6.5 10e3/uL Final  . HGB 07/12/2014 8.5* 11.6 - 15.9 g/dL Final  . HCT 07/12/2014 25.9* 34.8 - 46.6 % Final  . Platelets 07/12/2014 138* 145 - 400 10e3/uL Final  . MCV 07/12/2014 104.9* 79.5 - 101.0 fL Final  . MCH 07/12/2014 34.4* 25.1 - 34.0 pg Final  . MCHC 07/12/2014 32.7  31.5 - 36.0 g/dL Final  . RBC 07/12/2014 2.47* 3.70 - 5.45 10e6/uL Final  . RDW 07/12/2014 20.9* 11.2 - 14.5 % Final  . lymph# 07/12/2014 1.1  0.9 - 3.3 10e3/uL Final  . MONO# 07/12/2014 0.8  0.1 - 0.9 10e3/uL Final  . Eosinophils Absolute 07/12/2014 0.3  0.0 - 0.5 10e3/uL Final  . Basophils Absolute 07/12/2014 0.0  0.0 - 0.1 10e3/uL Final  . NEUT% 07/12/2014 58.3  38.4 - 76.8 % Final  . LYMPH% 07/12/2014 21.0  14.0 - 49.7 % Final  . MONO% 07/12/2014 14.6* 0.0 - 14.0 % Final  . EOS% 07/12/2014 5.4  0.0 - 7.0 % Final  . BASO% 07/12/2014 0.7  0.0 - 2.0 % Final  . Sodium 07/12/2014 144  136 - 145 mEq/L Final  . Potassium 07/12/2014 3.4* 3.5 - 5.1 mEq/L Final  . Chloride 07/12/2014 109  98 - 109 mEq/L Final  . CO2 07/12/2014 26  22 - 29 mEq/L Final  . Glucose 07/12/2014 104  70 - 140 mg/dl Final  . BUN 07/12/2014 24.2  7.0 - 26.0 mg/dL Final  . Creatinine 07/12/2014 3.3* 0.6 - 1.1 mg/dL Final  . Total Bilirubin 07/12/2014 1.05  0.20 - 1.20 mg/dL Final  . Alkaline Phosphatase 07/12/2014 102  40 - 150 U/L Final  . AST  07/12/2014 13  5 - 34 U/L Final  . ALT 07/12/2014 14  0 - 55 U/L Final  . Total Protein 07/12/2014 5.4* 6.4 - 8.3 g/dL Final  . Albumin 07/12/2014 3.4* 3.5 - 5.0 g/dL Final  . Calcium 07/12/2014 8.5  8.4 - 10.4 mg/dL Final  . Anion Gap 07/12/2014 9  3 -  11 mEq/L Final  . EGFR 07/12/2014 13* >90 ml/min/1.73 m2 Final   eGFR is calculated using the CKD-EPI Creatinine Equation (2009)   EKG: KIELE, HEAVRIN OT:[id number] 13-Jul-2014 09:15:10 [facility] ROUTINE RECORD Normal sinus rhythm Normal ECG Confirmed by ALLRED MD, JAMES ([id number]) on 07/13/2014 10:29:30 PM 82mm/s 30mm/mV $RemoveBefore'100Hz'rmDOjplHgxrir$  8.0 SP2 12SL 237 CID: [id number] Referred by: [name] Confirmed By: Thompson Grayer MD Vent. rate 87 BPM PR interval 160 ms QRS duration 86 ms QT/QTc 398/478 ms P-R-T axes 54 44 66 17-Apr-1940 (73 yr) Female Caucasian Room: Loc:511  RADIOGRAPHIC STUDIES: Dg Chest 2 View  07/13/2014   CLINICAL DATA:  Shortness of breath, chest tightness  EXAM: CHEST  2 VIEW  COMPARISON:  04/02/2014  FINDINGS: There is bilateral diffuse interstitial thickening. There is a small right pleural effusion. There is no left pleural effusion. There is left basilar airspace disease likely reflecting atelectasis. There is prominence of the central pulmonary vasculature. There is stable cardiomegaly. There is a right-sided Port-A-Cath in satisfactory position.  The osseous structures are unremarkable.  IMPRESSION: Findings concerning for mild CHF.   Electronically Signed   By: Kathreen Devoid   On: 07/13/2014 10:39    ASSESSMENT/PLAN:    Anemia Patient received cycle 3, day 15 of her Kyprolis/ Cytoxan chemotherapy on 07/12/2014.  Hemoglobin now down to 7.4.  Patient will receive 1 unit packed red blood cell transfusion today; and has plans to return to the cancer Center tomorrow to receive her second unit of blood transfusion.  Due to recent diagnosis of CHF-patient will receive an additional Lasix 20 mg IV following each unit of  blood.  CHF (congestive heart failure) Patient presented to the [facility] today with complaints of increased shortness of breath, mild chest tightness, and temperature to maximum 100.6 overnight.  She complains of an occasional dry, nonproductive cough only.  She has been using her albuterol inhaler at home with only minimal effectiveness.  On exam.-Patient does appear mildly short of breath.  Bilateral breath sounds slightly decreased; but no obvious wheezing or coughing.  Bilateral lower extremities with +2 to +3 edema.  O2 sats on room air between 88 and 90%.  Patient was placed on O2 via nasal cannula at 2 L with O2 sats in the mid 90s.  BNP drawn today.  Elevated to 968.  Chest x-ray obtained today revealed a bilateral diffuse interstitial thickening.  Small right pleural effusion.  No left pleural effusion.  Findings concerning for mild CHF.  EKG obtained today revealed a normal sinus rhythm with a rate of 87 and a QTC of 438.  Patient has been taking Lasix 40 mg every morning on a regular basis due to her chronic peripheral edema.  Patient is scheduled to receive 1 unit blood transfusion today; and another unit of blood tomorrow due to hemoglobin down to 7.4.  Patient was advised to continue with her Lasix 40 mg on a daily basis; and will also give Lasix 20 mg IV after each unit of blood.  Patient was advised to elevate her legs above the level of for heart as often as possible; and also to remain as active as possible.  Patient was also encouraged to follow-up with both her primary care physician and her cardiologist Dr. Liliane Channel to discuss further evaluation and management of CHF issues.  Multiple myeloma Patient received cycle 3, day 15 of his Kyprolis/Cytoxan chemotherapy yesterday, 07/12/2014.  White count remains fairly stable; but hemoglobin down to 7.4 and platelet count down  to 95.  Patient will receive 2 units packed red blood cell transfusion today.  Patient's planned  chemotherapy scheduled for today will be placed on hold.  Advice patient would consult with Dr. Julien Nordmann regarding plan for returning to the [facility] and let her know.    Patient stated understanding of all instructions; and was in agreement with this plan of care. The patient knows to call the clinic with any problems, questions or concerns.   Review/collaboration with Dr. Julien Nordmann regarding all aspects of patient's visit today.   Total time spent with patient was 40 minutes;  with greater than 75 percent of that time spent in face to face counseling regarding patient's symptoms,  and coordination of care and follow up.  Disclaimer: This note was dictated with voice recognition software. Similar sounding words can inadvertently be transcribed and may not be corrected upon review.   Drue Second, NP 07/15/2014

## 2014-07-15 NOTE — Telephone Encounter (Signed)
Per staff message and POF I have scheduled appts. Advised scheduler of appts. JMW  

## 2014-07-15 NOTE — Assessment & Plan Note (Signed)
Patient received cycle 3, day 15 of her Kyprolis/ Cytoxan chemotherapy on 07/12/2014.  Hemoglobin now down to 7.4.  Patient will receive 1 unit packed red blood cell transfusion today; and has plans to return to the cancer Center tomorrow to receive her second unit of blood transfusion.  Due to recent diagnosis of CHF-patient will receive an additional Lasix 20 mg IV following each unit of blood.

## 2014-07-15 NOTE — Telephone Encounter (Signed)
LM for Dr. [name] to review CXR completed on 07/13/14 with concerns for mild CHF.

## 2014-07-19 NOTE — Telephone Encounter (Signed)
She has normal EF volume issues from renal failure should be off florinef decision per her oncologist

## 2014-07-20 ENCOUNTER — Telehealth: Payer: Self-pay

## 2014-07-20 DIAGNOSIS — R809 Proteinuria, unspecified: Secondary | ICD-10-CM | POA: Diagnosis not present

## 2014-07-20 DIAGNOSIS — D630 Anemia in neoplastic disease: Secondary | ICD-10-CM | POA: Diagnosis not present

## 2014-07-20 DIAGNOSIS — N184 Chronic kidney disease, stage 4 (severe): Secondary | ICD-10-CM | POA: Diagnosis not present

## 2014-07-20 DIAGNOSIS — I951 Orthostatic hypotension: Secondary | ICD-10-CM | POA: Diagnosis not present

## 2014-07-20 NOTE — Telephone Encounter (Signed)
Josue Hector, MD at 07/19/2014 12:37 AM     Status: Signed       Expand All Collapse All   She has normal EF volume issues from renal failure should be off florinef decision per her oncologist            Carilyn Goodpasture, RN at 07/15/2014 1:46 PM     Status: Signed       Expand All Collapse All   Message routed to Dr [name] that patient wants him to know that she has new onset of CHF

## 2014-07-21 ENCOUNTER — Other Ambulatory Visit: Payer: Self-pay | Admitting: Internal Medicine

## 2014-07-21 ENCOUNTER — Other Ambulatory Visit: Payer: Self-pay | Admitting: *Deleted

## 2014-07-21 ENCOUNTER — Other Ambulatory Visit: Payer: Self-pay | Admitting: Physician Assistant

## 2014-07-21 MED ORDER — FLUTICASONE-SALMETEROL 100-50 MCG/DOSE IN AEPB: 1.0000 | INHALATION_SPRAY | Freq: Two times a day (BID) | RESPIRATORY_TRACT | Status: DC

## 2014-07-26 ENCOUNTER — Other Ambulatory Visit (HOSPITAL_BASED_OUTPATIENT_CLINIC_OR_DEPARTMENT_OTHER): Payer: Medicare Other

## 2014-07-26 ENCOUNTER — Ambulatory Visit (HOSPITAL_BASED_OUTPATIENT_CLINIC_OR_DEPARTMENT_OTHER): Payer: Medicare Other | Admitting: Physician Assistant

## 2014-07-26 ENCOUNTER — Encounter: Payer: Self-pay | Admitting: Physician Assistant

## 2014-07-26 ENCOUNTER — Ambulatory Visit (HOSPITAL_BASED_OUTPATIENT_CLINIC_OR_DEPARTMENT_OTHER): Payer: Medicare Other

## 2014-07-26 VITALS — BP 123/53 | HR 76 | Temp 98.3°F | Resp 17 | Ht 65.5 in | Wt 170.5 lb

## 2014-07-26 DIAGNOSIS — G629 Polyneuropathy, unspecified: Secondary | ICD-10-CM | POA: Diagnosis not present

## 2014-07-26 DIAGNOSIS — R110 Nausea: Secondary | ICD-10-CM

## 2014-07-26 DIAGNOSIS — C9002 Multiple myeloma in relapse: Secondary | ICD-10-CM | POA: Diagnosis not present

## 2014-07-26 DIAGNOSIS — C9000 Multiple myeloma not having achieved remission: Secondary | ICD-10-CM

## 2014-07-26 DIAGNOSIS — R5383 Other fatigue: Secondary | ICD-10-CM | POA: Diagnosis not present

## 2014-07-26 DIAGNOSIS — Z5111 Encounter for antineoplastic chemotherapy: Secondary | ICD-10-CM

## 2014-07-26 DIAGNOSIS — Z5112 Encounter for antineoplastic immunotherapy: Secondary | ICD-10-CM | POA: Diagnosis not present

## 2014-07-26 DIAGNOSIS — D630 Anemia in neoplastic disease: Secondary | ICD-10-CM | POA: Diagnosis not present

## 2014-07-26 DIAGNOSIS — Z853 Personal history of malignant neoplasm of breast: Secondary | ICD-10-CM | POA: Diagnosis not present

## 2014-07-26 DIAGNOSIS — R609 Edema, unspecified: Secondary | ICD-10-CM

## 2014-07-26 DIAGNOSIS — I5032 Chronic diastolic (congestive) heart failure: Secondary | ICD-10-CM

## 2014-07-26 LAB — CBC WITH DIFFERENTIAL/PLATELET
BASO%: 1.2 % (ref 0.0–2.0)
BASOS ABS: 0.1 10*3/uL (ref 0.0–0.1)
EOS%: 8 % — ABNORMAL HIGH (ref 0.0–7.0)
Eosinophils Absolute: 0.5 10*3/uL (ref 0.0–0.5)
HCT: 33.6 % — ABNORMAL LOW (ref 34.8–46.6)
HGB: 11.1 g/dL — ABNORMAL LOW (ref 11.6–15.9)
LYMPH%: 23.2 % (ref 14.0–49.7)
MCH: 33.7 pg (ref 25.1–34.0)
MCHC: 32.9 g/dL (ref 31.5–36.0)
MCV: 102.4 fL — AB (ref 79.5–101.0)
MONO#: 0.8 10*3/uL (ref 0.1–0.9)
MONO%: 13.7 % (ref 0.0–14.0)
NEUT%: 53.9 % (ref 38.4–76.8)
NEUTROS ABS: 3.1 10*3/uL (ref 1.5–6.5)
Platelets: 176 10*3/uL (ref 145–400)
RBC: 3.29 10*6/uL — ABNORMAL LOW (ref 3.70–5.45)
RDW: 21.7 % — AB (ref 11.2–14.5)
WBC: 5.8 10*3/uL (ref 3.9–10.3)
lymph#: 1.3 10*3/uL (ref 0.9–3.3)

## 2014-07-26 LAB — COMPREHENSIVE METABOLIC PANEL (CC13)
ALK PHOS: 101 U/L (ref 40–150)
ALT: 11 U/L (ref 0–55)
AST: 17 U/L (ref 5–34)
Albumin: 3.7 g/dL (ref 3.5–5.0)
Anion Gap: 9 mEq/L (ref 3–11)
BILIRUBIN TOTAL: 0.84 mg/dL (ref 0.20–1.20)
BUN: 33.8 mg/dL — AB (ref 7.0–26.0)
CO2: 25 mEq/L (ref 22–29)
Calcium: 9.5 mg/dL (ref 8.4–10.4)
Chloride: 109 mEq/L (ref 98–109)
Creatinine: 3.6 mg/dL (ref 0.6–1.1)
EGFR: 12 mL/min/{1.73_m2} — AB (ref >90)
GLUCOSE: 108 mg/dL (ref 70–140)
Potassium: 4.1 mEq/L (ref 3.5–5.1)
Sodium: 143 mEq/L (ref 136–145)
Total Protein: 5.7 g/dL — ABNORMAL LOW (ref 6.4–8.3)

## 2014-07-26 MED ORDER — SODIUM CHLORIDE 0.9 % IV SOLN
Freq: Once | INTRAVENOUS | Status: AC
  Administered 2014-07-26: 11:00:00 via INTRAVENOUS

## 2014-07-26 MED ORDER — SODIUM CHLORIDE 0.9 % IJ SOLN
10.0000 mL | INTRAMUSCULAR | Status: DC | PRN
  Administered 2014-07-26: 10 mL
  Filled 2014-07-26: qty 10

## 2014-07-26 MED ORDER — SODIUM CHLORIDE 0.9 % IV SOLN
300.0000 mg/m2 | Freq: Once | INTRAVENOUS | Status: AC
  Administered 2014-07-26: 560 mg via INTRAVENOUS
  Filled 2014-07-26: qty 28

## 2014-07-26 MED ORDER — SODIUM CHLORIDE 0.9 % IV SOLN
Freq: Once | INTRAVENOUS | Status: AC
  Administered 2014-07-26: 11:00:00 via INTRAVENOUS
  Filled 2014-07-26: qty 4

## 2014-07-26 MED ORDER — HEPARIN SOD (PORK) LOCK FLUSH 100 UNIT/ML IV SOLN
500.0000 [IU] | Freq: Once | INTRAVENOUS | Status: AC | PRN
  Administered 2014-07-26: 500 [IU]
  Filled 2014-07-26: qty 5

## 2014-07-26 MED ORDER — DEXTROSE 5 % IV SOLN
37.0000 mg/m2 | Freq: Once | INTRAVENOUS | Status: AC
  Administered 2014-07-26: 70 mg via INTRAVENOUS
  Filled 2014-07-26: qty 35

## 2014-07-26 NOTE — Patient Instructions (Signed)
Continue labs and chemotherapy as scheduled Follow up in one month with restaging protein studies to re-evaluate your disease

## 2014-07-26 NOTE — Patient Instructions (Signed)
[facility] Discharge Instructions for Patients Receiving Chemotherapy  Today you received the following chemotherapy agents Kyprolis and Cytoxan  To help prevent nausea and vomiting after your treatment, we encourage you to take your nausea medication.   If you develop nausea and vomiting that is not controlled by your nausea medication, call the clinic.   BELOW ARE SYMPTOMS THAT SHOULD BE REPORTED IMMEDIATELY:  *FEVER GREATER THAN 100.5 F  *CHILLS WITH OR WITHOUT FEVER  NAUSEA AND VOMITING THAT IS NOT CONTROLLED WITH YOUR NAUSEA MEDICATION  *UNUSUAL SHORTNESS OF BREATH  *UNUSUAL BRUISING OR BLEEDING  TENDERNESS IN MOUTH AND THROAT WITH OR WITHOUT PRESENCE OF ULCERS  *URINARY PROBLEMS  *BOWEL PROBLEMS  UNUSUAL RASH Items with * indicate a potential emergency and should be followed up as soon as possible.  Feel free to call the clinic you have any questions or concerns. The clinic phone number is [phone].  Please show the CHEMO ALERT CARD at check-in to the Emergency Department and triage nurse.   

## 2014-07-26 NOTE — Progress Notes (Addendum)
[place]  [phone] [phone]  OFFICE PROGRESS NOTE   PRINCIPAL DIAGNOSES:  1. Recurrent multiple myeloma initially diagnosed in 2002, initially with smoldering myeloma at [facility].  2. Ductal carcinoma in situ status post mastectomy with sentinel lymph node biopsy in October 2008.  PRIOR THERAPY:  1. Status post treatment with tamoxifen from November 2008 through February 2009, discontinued secondary to intolerance. 2. Status post 3 cycles of chemotherapy with Revlimid and Decadron followed by 1 cycle of Decadron only with mild response. 3. Status post 2 cycles of systemic chemotherapy with Velcade, Doxil and Decadron discontinued secondary to significant peripheral neuropathy. Last dose was given May 2010 at [facility]. 4. Status post autologous peripheral blood stem cell transplant on October 01, 2008 at [facility] under the care of Dr. Phyllis Ginger.  5. Systemic chemotherapy with Carfilzomib initially was 20 mg/M2 and will be increased after cycle #1 to 36 mg/M2 on days 1, 2, 8, 9, 15 and 16 every 4 weeks in addition to Cytoxan 300 mg/M2 and Decadron 40 mg by mouth weekly basis, status post 4 cycles. First cycle on 12/29/2012.    CURRENT THERAPY: She resumed chemotherapy with Carfilzomib, Cytoxan, and Decadron on 05/03/2014. Status post 3 cycles  INTERVAL HISTORY: Nancy Norris 74 y.o. female returns to the clinic today for followup visit. She continues to have fatigue and nausea. She has more energy since being transfused a total of 2 units of PRBCs 07/13/2014. She received lasix with each unit ( 1unit 07/13/2014, 1 unit 03/15/2014). She only received one treatment of her chemotherapy from cycle #3 secondary to her symptomatic anemia and CHF.  She continues to use Ativan at bedtime with improvement of her nausea. No vomiting. No fever or chills. The patient denied having any chest pain, cough or hemoptysis. Denies  bleeding. No significant weight loss or night sweats. Her neuropathy is at baseline. Her edema has improved as she remains on Lasix 40 mg daily. She presents to precede with day 1 of cycle #4. Her protein studies were last done 06/07/2014.  MEDICAL HISTORY: Past Medical History  Diagnosis Date  . Hyperkalemia   . Hypothyroidism   . COPD (chronic obstructive pulmonary disease)   . Hyponatremia   . Dizziness   . Fibromyalgia   . Breast cancer   . Multiple myeloma   . Mucositis   . Hx of echocardiogram     a.  Echocardiogram (12/26/2012): EF 07-68%, grade 1 diastolic dysfunction;   b.  Echocardiogram (02/2013): EF 55-60%, no WMA, trivial effusion  . Fibromyalgia   . Arthritis   . Hx of cardiovascular stress test     LexiScan with low level exercise Myoview (02/2013): No ischemia, EF 72%; normal study  . Complication of anesthesia   . PONV (postoperative nausea and vomiting) 2008    after mastestomy  . Myocardial infarction     in past, patient was unaware.   . Anginal pain     used NTG x 2 May 31 and 06/15/13   . GERD (gastroesophageal reflux disease)   . Headache(784.0)   . Anxiety   . Depression   . CKD (chronic kidney disease) stage 3, GFR 30-59 ml/min   . Anemia   . History of blood transfusion     last one May 12   . Neuropathy     ALLERGIES:  is allergic to codeine; latex; other; onion; zyprexa; adhesive; iodinated diagnostic agents; and sulfa antibiotics.  MEDICATIONS:  Current Outpatient Prescriptions  Medication Sig Dispense Refill  . acetaminophen (TYLENOL) 325 MG tablet Take 650 mg by mouth every 6 (six) hours as needed for mild pain or moderate pain.     Marland Kitchen albuterol (PROVENTIL HFA;VENTOLIN HFA) 108 (90 BASE) MCG/ACT inhaler Inhale 2 puffs into the lungs 2 (two) times daily as needed for wheezing or shortness of breath. 1 each 6  . ascorbic acid (VITAMIN C) 250 MG CHEW Chew 250 mg by mouth daily.    Marland Kitchen aspirin EC 81 MG tablet Take 1 tablet (81 mg total) by mouth  daily.    Marland Kitchen buPROPion (WELLBUTRIN XL) 300 MG 24 hr tablet Take 300 mg by mouth daily.   98  . carboxymethylcellulose (REFRESH PLUS) 0.5 % SOLN Place 1 drop into both eyes every 2 (two) hours.    . Cholecalciferol 4000 UNITS TABS Take 4,000 Units by mouth daily.    . citalopram (CELEXA) 40 MG tablet TAKE 1 TABLET BY MOUTH ONCE DAILY. 90 tablet 1  . cromolyn (OPTICROM) 4 % ophthalmic solution Place 1 drop into both eyes 4 (four) times daily.    Marland Kitchen dexamethasone (DECADRON) 4 MG tablet Take 10 tablets by mouth once a week as directed 40 tablet 3  . esomeprazole (NEXIUM) 40 MG capsule TAKE 1 CAPSULE BY MOUTH DAILY, EACH MORNING FOR ACID INDIGESTION 30 capsule PRN  . Ferrous Sulfate 134 MG TABS Take 1 tablet (134 mg total) by mouth every other day. 60 tablet 2  . fludrocortisone (FLORINEF) 0.1 MG tablet TAKE 1 TO 2 TABLETS DAILY AS DIRECTED FOR LOW BLOOD PRESSURE. 60 tablet 1  . fluticasone (FLONASE) 50 MCG/ACT nasal spray Place 2 sprays into both nostrils at bedtime.     . Fluticasone-Salmeterol (ADVAIR) 100-50 MCG/DOSE AEPB Inhale 1 puff into the lungs 2 (two) times daily. 60 each 3  . furosemide (LASIX) 40 MG tablet Take 1 tablet (40 mg total) by mouth daily. 30 tablet 1  . gabapentin (NEURONTIN) 300 MG capsule TAKE 1 CAPSULE BY MOUTH AT BEDTIME. 90 capsule 3  . hydrOXYzine (ATARAX/VISTARIL) 25 MG tablet TAKE 1 TABLET BY MOUTH 3 TIMES DAILY AS NEEDED. 90 tablet 1  . isosorbide mononitrate (IMDUR) 30 MG 24 hr tablet TAKE 1 TABLET BY MOUTH DAILY. 30 tablet 11  . levothyroxine (SYNTHROID, LEVOTHROID) 150 MCG tablet TAKE 1 TABLET BY MOUTH ONCE DAILY. 90 tablet 1  . loratadine (CLARITIN) 10 MG tablet Take 10 mg by mouth daily.    Marland Kitchen LORazepam (ATIVAN) 0.5 MG tablet Take 1 tablet (0.5 mg total) by mouth every 8 (eight) hours as needed (nausea). 30 tablet 0  . loteprednol (ALREX) 0.2 % SUSP Place 1 drop into both eyes 4 (four) times daily. 1 Bottle PRN  . meclizine (ANTIVERT) 32 MG tablet Take 1 tablet (32  mg total) by mouth 3 (three) times daily as needed. 30 tablet 0  . midodrine (PROAMATINE) 10 MG tablet Take 1 tablet (10 mg total) by mouth 3 (three) times daily. 90 tablet 11  . nystatin (MYCOSTATIN/NYSTOP) 100000 UNIT/GM POWD Apply 1 g topically 2 (two) times daily. Apply to belly    . ondansetron (ZOFRAN-ODT) 8 MG disintegrating tablet Take 1 tablet (8 mg total) by mouth every 8 (eight) hours as needed for nausea or vomiting. 20 tablet 3  . pilocarpine (SALAGEN) 5 MG tablet TAKE 1 TABLET BY MOUTH 3 TIMES DAILY. 90 tablet 0  . polyethylene glycol (MIRALAX / GLYCOLAX) packet Take 17 g by mouth daily.    Marland Kitchen  prochlorperazine (COMPAZINE) 10 MG tablet TAKE 1 TABLET BY MOUTH EVERY 6 HOURS AS NEEDED FOR NAUSEA OR VOMITING. 60 tablet 0  . ranitidine (ZANTAC) 300 MG tablet TAKE 1 TABLET BY MOUTH AT BEDTIME, FOR ACID REFLUX AND INDIGESTION. 30 tablet PRN  . RESTASIS 0.05 % ophthalmic emulsion Place 1 drop into both eyes 2 (two) times daily.   4  . zolpidem (AMBIEN) 5 MG tablet TAKE 1 TABLET BY MOUTH AT BEDTIME AS NEEDED FOR SLEEP. 30 tablet 0  . nitroGLYCERIN (NITROSTAT) 0.4 MG SL tablet Place 1 tablet (0.4 mg total) under the tongue every 5 (five) minutes as needed for chest pain. (Patient not taking: Reported on 07/26/2014) 25 tablet 3   No current facility-administered medications for this visit.   Facility-Administered Medications Ordered in Other Visits  Medication Dose Route Frequency Provider Last Rate Last Dose  . 0.9 %  sodium chloride infusion   Intravenous Once Rainbow Salman E Jereld Presti, PA-C      . sodium chloride 0.9 % injection 10 mL  10 mL Intracatheter PRN Curt Bears, MD   10 mL at 01/05/13 1825  . sodium chloride 0.9 % injection 10 mL  10 mL Intracatheter PRN Curt Bears, MD   10 mL at 06/15/14 1514  . sodium chloride 0.9 % injection 10 mL  10 mL Intracatheter PRN Curt Bears, MD   10 mL at 07/26/14 1315    SURGICAL HISTORY:  Past Surgical History  Procedure Laterality Date  .  History of port removal    . Status post stem cell transplant on September 28, 2008.    Marland Kitchen Abdominal hysterectomy  1981  . Cholecystectomy  1971  . Mastectomy Left 2008  . Cataract extraction, bilateral    . Breast surgery    . Eye surgery Bilateral     lens implant  . Breast reconstruction    . Portacath placement  12/2012    has had 2  . Av fistula placement Left 06/19/2013    Procedure: CREATION OF LEFT ARM ARTERIOVENOUS (AV) FISTULA ;  Surgeon: Angelia Mould, MD;  Location: [facility] OR;  Service: Vascular;  Laterality: Left;    REVIEW OF SYSTEMS:  A comprehensive ROS was negative except as noted in HPI.   PHYSICAL EXAMINATION: General appearance: alert, cooperative and no distress Head: Normocephalic, without obvious abnormality, atraumatic Neck: no adenopathy Lymph nodes: Cervical, supraclavicular, and axillary nodes normal. Resp: clear to auscultation bilaterally Back: symmetric, no curvature. ROM normal. No CVA tenderness. Cardio: regular rate and rhythm, S1, S2 normal, no murmur, click, rub or gallop GI: soft, non-tender; bowel sounds normal; no masses,  no organomegaly Extremities: edema 1+ in the bilateral lower extremities. Neurologic: Alert and oriented X 3, normal strength and tone. Normal symmetric reflexes. Normal coordination and gait  ECOG PERFORMANCE STATUS: 1 - Symptomatic but completely ambulatory  Blood pressure 123/53, pulse 76, temperature 98.3 F (36.8 C), temperature source Oral, resp. rate 17, height 5' 5.5" (1.664 m), weight 170 lb 8 oz (77.338 kg), SpO2 97 %.  LABORATORY DATA: Lab Results  Component Value Date   WBC 5.8 07/26/2014   HGB 11.1* 07/26/2014   HCT 33.6* 07/26/2014   MCV 102.4* 07/26/2014   PLT 176 07/26/2014      Chemistry      Component Value Date/Time   NA 143 07/26/2014 0948   NA 138 04/03/2014 0306   K 4.1 07/26/2014 0948   K 3.3* 04/03/2014 0306   CL 110 04/03/2014 0306   CL 105  03/19/2012 0811   CO2 25 07/26/2014 0948     CO2 22 04/03/2014 0306   BUN 33.8* 07/26/2014 0948   BUN 33* 04/03/2014 0306   CREATININE 3.6* 07/26/2014 0948   CREATININE 4.29* 04/03/2014 0306   CREATININE 3.87* 03/23/2014 1558      Component Value Date/Time   CALCIUM 9.5 07/26/2014 0948   CALCIUM 8.3* 04/03/2014 0306   ALKPHOS 101 07/26/2014 0948   ALKPHOS 71 04/03/2014 0306   AST 17 07/26/2014 0948   AST 20 04/03/2014 0306   ALT 11 07/26/2014 0948   ALT 26 04/03/2014 0306   BILITOT 0.84 07/26/2014 0948   BILITOT 0.7 04/03/2014 0306     Other lab results: Beta-2 microglobulin 9.07, free kappa light chain 1.32, free lambda light chain 0.16, kappa/lambda ratio 8.25. IgG 358 IgA <6 and IgM 8    RADIOGRAPHIC STUDIES:  ASSESSMENT AND PLAN: This is a very pleasant 74 year old white female with relapsed multiple myeloma. She is currently undergoing systemic chemotherapy with Carfilzomib, Cytoxan and dexamethasone status post 4 cycles. She was placed on observation but after consulting with her nephrologist she resumed treatment for her multiple myeloma.   Patient discussed with and also seen by Dr. Julien Nordmann. Labs reviewed. Her creatinine is slightly increased at 3.6 today. Recommend that she proceed with cycle 4 day 1 of her chemotherapy today. We will plan to repeat restaging protein studies on her off week, 08/16/2014.  She is scheduled back in 4 weeks prior to starting cycle 5 to discuss the results of her restaging protein studies.  She was advised to call immediately if she has any concerning symptoms in the interval.   Carlton Adam, PA-C 07/26/2014  ADDENDUM: Hematology/Oncology Attending: I had a face to face encounter with the patient today. I recommended her care plan. This is a very pleasant 74 years old white female with relapsed multiple myeloma who is currently undergoing treatment with systemic chemotherapy with Carfilzomib, Cytoxan and Decadron. She status post 4 cycles and tolerating her treatment fairly  well. I recommended for the patient to proceed with cycle #5 today as a scheduled. She will come back for follow-up visit in one month's for reevaluation after repeating myeloma panel. The patient was advised to call immediately if she has any concerning symptoms in the interval.  Disclaimer: This note was dictated with voice recognition software. Similar sounding words can inadvertently be transcribed and may be missed upon review. [name]., MD 07/26/2014

## 2014-07-26 NOTE — Progress Notes (Signed)
Per Burnetta Sabin, PA- ok to treat despite pt Cr. 3.6.

## 2014-07-27 ENCOUNTER — Ambulatory Visit (HOSPITAL_BASED_OUTPATIENT_CLINIC_OR_DEPARTMENT_OTHER): Payer: Medicare Other

## 2014-07-27 VITALS — BP 133/52 | HR 79 | Temp 98.4°F | Resp 17

## 2014-07-27 DIAGNOSIS — Z5112 Encounter for antineoplastic immunotherapy: Secondary | ICD-10-CM | POA: Diagnosis not present

## 2014-07-27 DIAGNOSIS — C9000 Multiple myeloma not having achieved remission: Secondary | ICD-10-CM | POA: Diagnosis not present

## 2014-07-27 MED ORDER — HEPARIN SOD (PORK) LOCK FLUSH 100 UNIT/ML IV SOLN
500.0000 [IU] | Freq: Once | INTRAVENOUS | Status: AC | PRN
  Administered 2014-07-27: 500 [IU]
  Filled 2014-07-27: qty 5

## 2014-07-27 MED ORDER — SODIUM CHLORIDE 0.9 % IV SOLN
Freq: Once | INTRAVENOUS | Status: AC
  Administered 2014-07-27: 14:00:00 via INTRAVENOUS

## 2014-07-27 MED ORDER — DEXTROSE 5 % IV SOLN
37.0000 mg/m2 | Freq: Once | INTRAVENOUS | Status: AC
  Administered 2014-07-27: 70 mg via INTRAVENOUS
  Filled 2014-07-27: qty 35

## 2014-07-27 MED ORDER — SODIUM CHLORIDE 0.9 % IJ SOLN
10.0000 mL | INTRAMUSCULAR | Status: DC | PRN
  Administered 2014-07-27: 10 mL
  Filled 2014-07-27: qty 10

## 2014-07-27 MED ORDER — SODIUM CHLORIDE 0.9 % IV SOLN
Freq: Once | INTRAVENOUS | Status: AC
  Administered 2014-07-27: 14:00:00 via INTRAVENOUS
  Filled 2014-07-27: qty 4

## 2014-07-27 MED ORDER — SODIUM CHLORIDE 0.9 % IV SOLN
Freq: Once | INTRAVENOUS | Status: AC
  Administered 2014-07-27: 13:00:00 via INTRAVENOUS

## 2014-07-27 NOTE — Patient Instructions (Signed)
[facility] Discharge Instructions for Patients Receiving Chemotherapy  Today you received the following chemotherapy agents: Kyprolis   To help prevent nausea and vomiting after your treatment, we encourage you to take your nausea medication as directed.    If you develop nausea and vomiting that is not controlled by your nausea medication, call the clinic.   BELOW ARE SYMPTOMS THAT SHOULD BE REPORTED IMMEDIATELY:  *FEVER GREATER THAN 100.5 F  *CHILLS WITH OR WITHOUT FEVER  NAUSEA AND VOMITING THAT IS NOT CONTROLLED WITH YOUR NAUSEA MEDICATION  *UNUSUAL SHORTNESS OF BREATH  *UNUSUAL BRUISING OR BLEEDING  TENDERNESS IN MOUTH AND THROAT WITH OR WITHOUT PRESENCE OF ULCERS  *URINARY PROBLEMS  *BOWEL PROBLEMS  UNUSUAL RASH Items with * indicate a potential emergency and should be followed up as soon as possible.  Feel free to call the clinic you have any questions or concerns. The clinic phone number is [phone].  Please show the CHEMO ALERT CARD at check-in to the Emergency Department and triage nurse.   

## 2014-07-27 NOTE — Progress Notes (Signed)
Pt reports a throbbing headache, rated an 8 on a scale of 0-10. Pt states she has a history of migraines and this headache feels the same. States that she has tylenol Extra strength with her today and that usually helps "Knock the edge off".  Pt took home tylenol.  Armanda Magic, Dr. Worthy Flank RN notified and will make Dr. Julien Nordmann aware.

## 2014-07-27 NOTE — Progress Notes (Signed)
Patient states headache is improving, rates it 5/10.  Pt has no needs at this time. Will continue to monitor.

## 2014-08-02 ENCOUNTER — Other Ambulatory Visit (HOSPITAL_BASED_OUTPATIENT_CLINIC_OR_DEPARTMENT_OTHER): Payer: Medicare Other

## 2014-08-02 ENCOUNTER — Ambulatory Visit (HOSPITAL_BASED_OUTPATIENT_CLINIC_OR_DEPARTMENT_OTHER): Payer: Medicare Other

## 2014-08-02 ENCOUNTER — Other Ambulatory Visit: Payer: Self-pay | Admitting: Physician Assistant

## 2014-08-02 VITALS — BP 107/58 | HR 73 | Temp 98.0°F | Resp 18

## 2014-08-02 DIAGNOSIS — Z5111 Encounter for antineoplastic chemotherapy: Secondary | ICD-10-CM | POA: Diagnosis not present

## 2014-08-02 DIAGNOSIS — C9000 Multiple myeloma not having achieved remission: Secondary | ICD-10-CM | POA: Diagnosis not present

## 2014-08-02 DIAGNOSIS — Z5112 Encounter for antineoplastic immunotherapy: Secondary | ICD-10-CM

## 2014-08-02 DIAGNOSIS — C9001 Multiple myeloma in remission: Secondary | ICD-10-CM

## 2014-08-02 DIAGNOSIS — Z853 Personal history of malignant neoplasm of breast: Secondary | ICD-10-CM

## 2014-08-02 LAB — COMPREHENSIVE METABOLIC PANEL (CC13)
ALBUMIN: 3.9 g/dL (ref 3.5–5.0)
ALT: 20 U/L (ref 0–55)
AST: 21 U/L (ref 5–34)
Alkaline Phosphatase: 121 U/L (ref 40–150)
Anion Gap: 11 mEq/L (ref 3–11)
BUN: 50.5 mg/dL — ABNORMAL HIGH (ref 7.0–26.0)
CO2: 23 meq/L (ref 22–29)
CREATININE: 3.7 mg/dL — AB (ref 0.6–1.1)
Calcium: 10.3 mg/dL (ref 8.4–10.4)
Chloride: 107 mEq/L (ref 98–109)
EGFR: 12 mL/min/{1.73_m2} — ABNORMAL LOW (ref >90)
Glucose: 88 mg/dl (ref 70–140)
Potassium: 5 mEq/L (ref 3.5–5.1)
Sodium: 141 mEq/L (ref 136–145)
Total Bilirubin: 0.81 mg/dL (ref 0.20–1.20)
Total Protein: 6.3 g/dL — ABNORMAL LOW (ref 6.4–8.3)

## 2014-08-02 LAB — CBC WITH DIFFERENTIAL/PLATELET
BASO%: 0.9 % (ref 0.0–2.0)
Basophils Absolute: 0.1 10*3/uL (ref 0.0–0.1)
EOS%: 11.5 % — ABNORMAL HIGH (ref 0.0–7.0)
Eosinophils Absolute: 0.7 10*3/uL — ABNORMAL HIGH (ref 0.0–0.5)
HEMATOCRIT: 35.2 % (ref 34.8–46.6)
HGB: 11.6 g/dL (ref 11.6–15.9)
LYMPH#: 1.4 10*3/uL (ref 0.9–3.3)
LYMPH%: 22 % (ref 14.0–49.7)
MCH: 33.5 pg (ref 25.1–34.0)
MCHC: 32.9 g/dL (ref 31.5–36.0)
MCV: 101.8 fL — AB (ref 79.5–101.0)
MONO#: 1.2 10*3/uL — ABNORMAL HIGH (ref 0.1–0.9)
MONO%: 18.8 % — AB (ref 0.0–14.0)
NEUT#: 2.9 10*3/uL (ref 1.5–6.5)
NEUT%: 46.8 % (ref 38.4–76.8)
Platelets: 83 10*3/uL — ABNORMAL LOW (ref 145–400)
RBC: 3.46 10*6/uL — ABNORMAL LOW (ref 3.70–5.45)
RDW: 20.6 % — ABNORMAL HIGH (ref 11.2–14.5)
WBC: 6.3 10*3/uL (ref 3.9–10.3)

## 2014-08-02 MED ORDER — SODIUM CHLORIDE 0.9 % IV SOLN
300.0000 mg/m2 | Freq: Once | INTRAVENOUS | Status: AC
  Administered 2014-08-02: 560 mg via INTRAVENOUS
  Filled 2014-08-02: qty 28

## 2014-08-02 MED ORDER — SODIUM CHLORIDE 0.9 % IV SOLN
Freq: Once | INTRAVENOUS | Status: AC
  Administered 2014-08-02: 12:00:00 via INTRAVENOUS

## 2014-08-02 MED ORDER — SODIUM CHLORIDE 0.9 % IV SOLN
Freq: Once | INTRAVENOUS | Status: AC
  Administered 2014-08-02: 14:00:00 via INTRAVENOUS
  Filled 2014-08-02: qty 4

## 2014-08-02 MED ORDER — DEXTROSE 5 % IV SOLN
37.0000 mg/m2 | Freq: Once | INTRAVENOUS | Status: AC
  Administered 2014-08-02: 70 mg via INTRAVENOUS
  Filled 2014-08-02: qty 35

## 2014-08-02 MED ORDER — HEPARIN SOD (PORK) LOCK FLUSH 100 UNIT/ML IV SOLN
500.0000 [IU] | Freq: Once | INTRAVENOUS | Status: AC | PRN
  Administered 2014-08-02: 500 [IU]
  Filled 2014-08-02: qty 5

## 2014-08-02 MED ORDER — SODIUM CHLORIDE 0.9 % IJ SOLN
10.0000 mL | INTRAMUSCULAR | Status: DC | PRN
  Administered 2014-08-02: 10 mL
  Filled 2014-08-02: qty 10

## 2014-08-02 MED ORDER — SODIUM CHLORIDE 0.9 % IV SOLN
Freq: Once | INTRAVENOUS | Status: AC
  Administered 2014-08-02: 13:00:00 via INTRAVENOUS

## 2014-08-02 MED ORDER — ZOLEDRONIC ACID 4 MG/5ML IV CONC
3.0000 mg | Freq: Once | INTRAVENOUS | Status: AC
  Administered 2014-08-02: 3 mg via INTRAVENOUS
  Filled 2014-08-02: qty 3.75

## 2014-08-02 NOTE — Progress Notes (Signed)
12:30 While waiting for pt's labs to result, pt reported feeling dizzy especially upon standing and "woozy in the head."  Patient states she has eaten today.  Vitals stable and BP at pt's baseline.  Orthostatic BP performed with 115/53 sitting, 113/51 lying, and 106/48 standing.  Slight drop but nothing significant as BP 107/58 when initially entering infusion room.  Spoke with [facility] and notified of pts symptoms.  Will continue to monitor throughout infusion.  1305: Per Dr. Julien Nordmann, okay to treat with today's labs.

## 2014-08-02 NOTE — Patient Instructions (Signed)
[place] Discharge Instructions for Patients Receiving Chemotherapy  Today you received the following chemotherapy agents Kyprolis/Cytoxan/Zometa.  To help prevent nausea and vomiting after your treatment, we encourage you to take your nausea medication as directed.   If you develop nausea and vomiting that is not controlled by your nausea medication, call the clinic.   BELOW ARE SYMPTOMS THAT SHOULD BE REPORTED IMMEDIATELY:  *FEVER GREATER THAN 100.5 F  *CHILLS WITH OR WITHOUT FEVER  NAUSEA AND VOMITING THAT IS NOT CONTROLLED WITH YOUR NAUSEA MEDICATION  *UNUSUAL SHORTNESS OF BREATH  *UNUSUAL BRUISING OR BLEEDING  TENDERNESS IN MOUTH AND THROAT WITH OR WITHOUT PRESENCE OF ULCERS  *URINARY PROBLEMS  *BOWEL PROBLEMS  UNUSUAL RASH Items with * indicate a potential emergency and should be followed up as soon as possible.  Feel free to call the clinic you have any questions or concerns. The clinic phone number is [phone].  Please show the [place] at check-in to the Emergency Department and triage nurse.

## 2014-08-03 ENCOUNTER — Ambulatory Visit (HOSPITAL_BASED_OUTPATIENT_CLINIC_OR_DEPARTMENT_OTHER): Payer: Medicare Other

## 2014-08-03 VITALS — BP 117/56 | HR 72 | Temp 98.9°F | Resp 18

## 2014-08-03 DIAGNOSIS — C9000 Multiple myeloma not having achieved remission: Secondary | ICD-10-CM

## 2014-08-03 DIAGNOSIS — Z5112 Encounter for antineoplastic immunotherapy: Secondary | ICD-10-CM | POA: Diagnosis not present

## 2014-08-03 MED ORDER — DEXTROSE 5 % IV SOLN
37.0000 mg/m2 | Freq: Once | INTRAVENOUS | Status: AC
  Administered 2014-08-03: 70 mg via INTRAVENOUS
  Filled 2014-08-03: qty 35

## 2014-08-03 MED ORDER — SODIUM CHLORIDE 0.9 % IV SOLN
Freq: Once | INTRAVENOUS | Status: AC
  Administered 2014-08-03: 14:00:00 via INTRAVENOUS
  Filled 2014-08-03: qty 4

## 2014-08-03 MED ORDER — HEPARIN SOD (PORK) LOCK FLUSH 100 UNIT/ML IV SOLN
500.0000 [IU] | Freq: Once | INTRAVENOUS | Status: AC | PRN
  Administered 2014-08-03: 500 [IU]
  Filled 2014-08-03: qty 5

## 2014-08-03 MED ORDER — SODIUM CHLORIDE 0.9 % IV SOLN
Freq: Once | INTRAVENOUS | Status: AC
  Administered 2014-08-03: 14:00:00 via INTRAVENOUS

## 2014-08-03 MED ORDER — SODIUM CHLORIDE 0.9 % IJ SOLN
10.0000 mL | INTRAMUSCULAR | Status: DC | PRN
  Administered 2014-08-03: 10 mL
  Filled 2014-08-03: qty 10

## 2014-08-03 NOTE — Patient Instructions (Signed)
[facility] Discharge Instructions for Patients Receiving Chemotherapy  Today you received the following chemotherapy agents Kyprolis  To help prevent nausea and vomiting after your treatment, we encourage you to take your nausea medication    If you develop nausea and vomiting that is not controlled by your nausea medication, call the clinic.   BELOW ARE SYMPTOMS THAT SHOULD BE REPORTED IMMEDIATELY:  *FEVER GREATER THAN 100.5 F  *CHILLS WITH OR WITHOUT FEVER  NAUSEA AND VOMITING THAT IS NOT CONTROLLED WITH YOUR NAUSEA MEDICATION  *UNUSUAL SHORTNESS OF BREATH  *UNUSUAL BRUISING OR BLEEDING  TENDERNESS IN MOUTH AND THROAT WITH OR WITHOUT PRESENCE OF ULCERS  *URINARY PROBLEMS  *BOWEL PROBLEMS  UNUSUAL RASH Items with * indicate a potential emergency and should be followed up as soon as possible.  Feel free to call the clinic you have any questions or concerns. The clinic phone number is [phone].  Please show the CHEMO ALERT CARD at check-in to the Emergency Department and triage nurse.   

## 2014-08-09 ENCOUNTER — Other Ambulatory Visit (HOSPITAL_BASED_OUTPATIENT_CLINIC_OR_DEPARTMENT_OTHER): Payer: Medicare Other

## 2014-08-09 ENCOUNTER — Ambulatory Visit: Payer: Medicare Other

## 2014-08-09 DIAGNOSIS — C9000 Multiple myeloma not having achieved remission: Secondary | ICD-10-CM

## 2014-08-09 LAB — COMPREHENSIVE METABOLIC PANEL (CC13)
ALBUMIN: 3.8 g/dL (ref 3.5–5.0)
ALT: 18 U/L (ref 0–55)
ANION GAP: 9 meq/L (ref 3–11)
AST: 16 U/L (ref 5–34)
Alkaline Phosphatase: 127 U/L (ref 40–150)
BUN: 41.7 mg/dL — AB (ref 7.0–26.0)
CALCIUM: 9.6 mg/dL (ref 8.4–10.4)
CO2: 24 meq/L (ref 22–29)
CREATININE: 3.5 mg/dL — AB (ref 0.6–1.1)
Chloride: 108 mEq/L (ref 98–109)
EGFR: 12 mL/min/{1.73_m2} — AB (ref >90)
Glucose: 88 mg/dl (ref 70–140)
Potassium: 4.9 mEq/L (ref 3.5–5.1)
Sodium: 141 mEq/L (ref 136–145)
Total Bilirubin: 0.75 mg/dL (ref 0.20–1.20)
Total Protein: 6 g/dL — ABNORMAL LOW (ref 6.4–8.3)

## 2014-08-09 LAB — CBC WITH DIFFERENTIAL/PLATELET
BASO%: 0.7 % (ref 0.0–2.0)
Basophils Absolute: 0 10*3/uL (ref 0.0–0.1)
EOS ABS: 0.4 10*3/uL (ref 0.0–0.5)
EOS%: 10.1 % — ABNORMAL HIGH (ref 0.0–7.0)
HCT: 30.6 % — ABNORMAL LOW (ref 34.8–46.6)
HGB: 10.2 g/dL — ABNORMAL LOW (ref 11.6–15.9)
LYMPH%: 20.1 % (ref 14.0–49.7)
MCH: 33.8 pg (ref 25.1–34.0)
MCHC: 33.3 g/dL (ref 31.5–36.0)
MCV: 101.3 fL — AB (ref 79.5–101.0)
MONO#: 0.9 10*3/uL (ref 0.1–0.9)
MONO%: 20.8 % — AB (ref 0.0–14.0)
NEUT%: 48.3 % (ref 38.4–76.8)
NEUTROS ABS: 2.1 10*3/uL (ref 1.5–6.5)
Platelets: 52 10*3/uL — ABNORMAL LOW (ref 145–400)
RBC: 3.02 10*6/uL — ABNORMAL LOW (ref 3.70–5.45)
RDW: 17.9 % — ABNORMAL HIGH (ref 11.2–14.5)
WBC: 4.3 10*3/uL (ref 3.9–10.3)
lymph#: 0.9 10*3/uL (ref 0.9–3.3)

## 2014-08-09 NOTE — Progress Notes (Signed)
Platelets 52. Spoke with Dr. Julien Nordmann. Spoke with patient in lobby, per Dr. Julien Nordmann to resume with next scheduled chemo treatment. Patient verbalized understanding.

## 2014-08-10 ENCOUNTER — Ambulatory Visit: Payer: Medicare Other

## 2014-08-13 ENCOUNTER — Ambulatory Visit (INDEPENDENT_AMBULATORY_CARE_PROVIDER_SITE_OTHER): Payer: Medicare Other | Admitting: Internal Medicine

## 2014-08-13 ENCOUNTER — Encounter: Payer: Self-pay | Admitting: Internal Medicine

## 2014-08-13 VITALS — BP 96/44 | HR 54 | Temp 97.8°F | Resp 16 | Ht 65.0 in | Wt 157.0 lb

## 2014-08-13 DIAGNOSIS — E039 Hypothyroidism, unspecified: Secondary | ICD-10-CM

## 2014-08-13 DIAGNOSIS — R7303 Prediabetes: Secondary | ICD-10-CM

## 2014-08-13 DIAGNOSIS — N185 Chronic kidney disease, stage 5: Secondary | ICD-10-CM | POA: Diagnosis not present

## 2014-08-13 DIAGNOSIS — R7309 Other abnormal glucose: Secondary | ICD-10-CM

## 2014-08-13 DIAGNOSIS — I959 Hypotension, unspecified: Secondary | ICD-10-CM

## 2014-08-13 DIAGNOSIS — I10 Essential (primary) hypertension: Secondary | ICD-10-CM | POA: Diagnosis not present

## 2014-08-13 DIAGNOSIS — E782 Mixed hyperlipidemia: Secondary | ICD-10-CM

## 2014-08-13 DIAGNOSIS — Z79899 Other long term (current) drug therapy: Secondary | ICD-10-CM | POA: Diagnosis not present

## 2014-08-13 DIAGNOSIS — E559 Vitamin D deficiency, unspecified: Secondary | ICD-10-CM | POA: Diagnosis not present

## 2014-08-13 LAB — CBC WITH DIFFERENTIAL/PLATELET
Basophils Absolute: 0 10*3/uL (ref 0.0–0.1)
Basophils Relative: 0 % (ref 0–1)
Eosinophils Absolute: 0.2 10*3/uL (ref 0.0–0.7)
Eosinophils Relative: 5 % (ref 0–5)
HCT: 30.3 % — ABNORMAL LOW (ref 36.0–46.0)
Hemoglobin: 10.4 g/dL — ABNORMAL LOW (ref 12.0–15.0)
Lymphocytes Relative: 18 % (ref 12–46)
Lymphs Abs: 0.6 10*3/uL — ABNORMAL LOW (ref 0.7–4.0)
MCH: 33.8 pg (ref 26.0–34.0)
MCHC: 34.3 g/dL (ref 30.0–36.0)
MCV: 98.4 fL (ref 78.0–100.0)
MONO ABS: 0.5 10*3/uL (ref 0.1–1.0)
MPV: 8.8 fL (ref 8.6–12.4)
Monocytes Relative: 17 % — ABNORMAL HIGH (ref 3–12)
NEUTROS PCT: 60 % (ref 43–77)
Neutro Abs: 1.9 10*3/uL (ref 1.7–7.7)
PLATELETS: 109 10*3/uL — AB (ref 150–400)
RBC: 3.08 MIL/uL — ABNORMAL LOW (ref 3.87–5.11)
RDW: 18.5 % — AB (ref 11.5–15.5)
WBC: 3.2 10*3/uL — AB (ref 4.0–10.5)

## 2014-08-13 LAB — HEPATIC FUNCTION PANEL
ALT: 13 U/L (ref 6–29)
AST: 16 U/L (ref 10–35)
Albumin: 4.1 g/dL (ref 3.6–5.1)
Alkaline Phosphatase: 113 U/L (ref 33–130)
BILIRUBIN TOTAL: 1.1 mg/dL (ref 0.2–1.2)
Bilirubin, Direct: 0.2 mg/dL (ref <=0.2)
Indirect Bilirubin: 0.9 mg/dL (ref 0.2–1.2)
TOTAL PROTEIN: 5.8 g/dL — AB (ref 6.1–8.1)

## 2014-08-13 LAB — LIPID PANEL
CHOL/HDL RATIO: 4 ratio (ref <=5.0)
CHOLESTEROL: 158 mg/dL (ref 125–200)
HDL: 40 mg/dL — ABNORMAL LOW (ref >=46)
LDL CALC: 86 mg/dL (ref <130)
Triglycerides: 160 mg/dL — ABNORMAL HIGH (ref <150)
VLDL: 32 mg/dL — AB (ref <30)

## 2014-08-13 LAB — BASIC METABOLIC PANEL WITH GFR
BUN: 40 mg/dL — AB (ref 7–25)
CALCIUM: 8.6 mg/dL (ref 8.6–10.4)
CHLORIDE: 105 mmol/L (ref 98–110)
CO2: 23 mmol/L (ref 20–31)
Creat: 3.2 mg/dL — ABNORMAL HIGH (ref 0.60–0.93)
GFR, Est African American: 16 mL/min — ABNORMAL LOW (ref >=60)
GFR, Est Non African American: 14 mL/min — ABNORMAL LOW (ref >=60)
GLUCOSE: 103 mg/dL — AB (ref 65–99)
POTASSIUM: 3.6 mmol/L (ref 3.5–5.3)
Sodium: 137 mmol/L (ref 135–146)

## 2014-08-13 LAB — MAGNESIUM: Magnesium: 2.2 mg/dL (ref 1.5–2.5)

## 2014-08-13 LAB — HEMOGLOBIN A1C
Hgb A1c MFr Bld: 5 % (ref <5.7)
Mean Plasma Glucose: 97 mg/dL (ref <117)

## 2014-08-13 LAB — TSH: TSH: 4.727 u[IU]/mL — AB (ref 0.350–4.500)

## 2014-08-13 MED ORDER — LOPERAMIDE HCL 2 MG PO CAPS: ORAL_CAPSULE | ORAL | Status: DC

## 2014-08-13 NOTE — Progress Notes (Signed)
Patient ID: Nancy Norris, female   DOB: 08/07/1940, 74 y.o.   MRN: [id number]  Assessment and Plan:  Hypertension:  -Continue medication,  -monitor blood pressure at home.  -Continue DASH diet.   -Reminder to go to the ER if any CP, SOB, nausea, dizziness, severe HA, changes vision/speech, left arm numbness and tingling, and jaw pain.  Cholesterol: -Continue diet and exercise.  -Check cholesterol.   Pre-diabetes: -Continue diet and exercise.  -Check A1C  Vitamin D Def: -check level -continue medications.   Hypotension -increased from normal -hold lasix -may be due to diarrhea -try immodium -fluid replacements/eating -recheck Monday or Tuesday -patient to go to the ER if worsening hypotension or any symptoms of infection given immunocompromised state.  Continue diet and meds as discussed. Further disposition pending results of labs.  HPI 74 y.o. female  presents for 3 month follow up with hypertension, hyperlipidemia, prediabetes and vitamin D.   Her blood pressure has not been controlled at home, today their BP is BP: (!) 96/44 mmHg.   She does not workout. She denies chest pain, shortness of breath, dizziness.  She reports that her blood pressure is usually around 110-112/60's.   Sometimes it goes down as low as 90/55.   She is not on cholesterol medication and denies myalgias. Her cholesterol is not at goal. The cholesterol last visit was:   Lab Results  Component Value Date   CHOL 151 04/20/2014   HDL 31* 04/20/2014   LDLCALC 98 04/20/2014   TRIG 108 04/20/2014   CHOLHDL 4.9 04/20/2014     She has been working on diet and exercise for prediabetes, and denies foot ulcerations, hyperglycemia, hypoglycemia , increased appetite, nausea, paresthesia of the feet, polydipsia, polyuria, visual disturbances, vomiting and weight loss. Last A1C in the office was:  Lab Results  Component Value Date   HGBA1C 5.1 04/20/2014    Patient is on Vitamin D supplement.  Lab  Results  Component Value Date   VD25OH 41 04/20/2014     She reports that she has been following with Dr. [name] and she has been taken off florinef as she had 40+ lbs of fluid due to this medication.  She was not able to have her last infusion on Tuesday as she had platelets that were too low.    She does report that for the past week she has had diarrhea.  Sh reports that she is having 4-5 bowel movements per week.  She reports that she has not had blood in her stools.  It first started after having smoothies for breakfast in the mornings but since stopping fruit it has continued.     Current Medications:  Current Outpatient Prescriptions on File Prior to Visit  Medication Sig Dispense Refill  . acetaminophen (TYLENOL) 325 MG tablet Take 650 mg by mouth every 6 (six) hours as needed for mild pain or moderate pain.     Marland Kitchen albuterol (PROVENTIL HFA;VENTOLIN HFA) 108 (90 BASE) MCG/ACT inhaler Inhale 2 puffs into the lungs 2 (two) times daily as needed for wheezing or shortness of breath. 1 each 6  . ascorbic acid (VITAMIN C) 250 MG CHEW Chew 250 mg by mouth daily.    Marland Kitchen aspirin EC 81 MG tablet Take 1 tablet (81 mg total) by mouth daily.    Marland Kitchen buPROPion (WELLBUTRIN XL) 300 MG 24 hr tablet Take 300 mg by mouth daily.   98  . carboxymethylcellulose (REFRESH PLUS) 0.5 % SOLN Place 1 drop into both eyes  every 2 (two) hours.    . Cholecalciferol 4000 UNITS TABS Take 4,000 Units by mouth daily.    . citalopram (CELEXA) 40 MG tablet TAKE 1 TABLET BY MOUTH ONCE DAILY. 90 tablet 1  . cromolyn (OPTICROM) 4 % ophthalmic solution Place 1 drop into both eyes 4 (four) times daily.    Marland Kitchen dexamethasone (DECADRON) 4 MG tablet Take 10 tablets by mouth once a week as directed 40 tablet 3  . esomeprazole (NEXIUM) 40 MG capsule TAKE 1 CAPSULE BY MOUTH DAILY, EACH MORNING FOR ACID INDIGESTION 30 capsule PRN  . Ferrous Sulfate 134 MG TABS Take 1 tablet (134 mg total) by mouth every other day. 60 tablet 2  .  fludrocortisone (FLORINEF) 0.1 MG tablet TAKE 1 TO 2 TABLETS DAILY AS DIRECTED FOR LOW BLOOD PRESSURE. 60 tablet 1  . fluticasone (FLONASE) 50 MCG/ACT nasal spray Place 2 sprays into both nostrils at bedtime.     . Fluticasone-Salmeterol (ADVAIR) 100-50 MCG/DOSE AEPB Inhale 1 puff into the lungs 2 (two) times daily. 60 each 3  . furosemide (LASIX) 40 MG tablet Take 1 tablet (40 mg total) by mouth daily. 30 tablet 1  . gabapentin (NEURONTIN) 300 MG capsule TAKE 1 CAPSULE BY MOUTH AT BEDTIME. 90 capsule 3  . hydrOXYzine (ATARAX/VISTARIL) 25 MG tablet TAKE 1 TABLET BY MOUTH 3 TIMES DAILY AS NEEDED. 90 tablet 1  . isosorbide mononitrate (IMDUR) 30 MG 24 hr tablet TAKE 1 TABLET BY MOUTH DAILY. 30 tablet 11  . levothyroxine (SYNTHROID, LEVOTHROID) 150 MCG tablet TAKE 1 TABLET BY MOUTH ONCE DAILY. 90 tablet 1  . loratadine (CLARITIN) 10 MG tablet Take 10 mg by mouth daily.    Marland Kitchen LORazepam (ATIVAN) 0.5 MG tablet Take 1 tablet (0.5 mg total) by mouth every 8 (eight) hours as needed (nausea). 30 tablet 0  . loteprednol (ALREX) 0.2 % SUSP Place 1 drop into both eyes 4 (four) times daily. 1 Bottle PRN  . meclizine (ANTIVERT) 32 MG tablet Take 1 tablet (32 mg total) by mouth 3 (three) times daily as needed. 30 tablet 0  . midodrine (PROAMATINE) 10 MG tablet Take 1 tablet (10 mg total) by mouth 3 (three) times daily. 90 tablet 11  . nitroGLYCERIN (NITROSTAT) 0.4 MG SL tablet Place 1 tablet (0.4 mg total) under the tongue every 5 (five) minutes as needed for chest pain. 25 tablet 3  . nystatin (MYCOSTATIN/NYSTOP) 100000 UNIT/GM POWD Apply 1 g topically 2 (two) times daily. Apply to belly    . ondansetron (ZOFRAN-ODT) 8 MG disintegrating tablet Take 1 tablet (8 mg total) by mouth every 8 (eight) hours as needed for nausea or vomiting. 20 tablet 3  . pilocarpine (SALAGEN) 5 MG tablet TAKE 1 TABLET BY MOUTH 3 TIMES DAILY. 90 tablet 0  . polyethylene glycol (MIRALAX / GLYCOLAX) packet Take 17 g by mouth daily.    .  prochlorperazine (COMPAZINE) 10 MG tablet TAKE 1 TABLET BY MOUTH EVERY 6 HOURS AS NEEDED FOR NAUSEA OR VOMITING. 60 tablet 0  . ranitidine (ZANTAC) 300 MG tablet TAKE 1 TABLET BY MOUTH AT BEDTIME, FOR ACID REFLUX AND INDIGESTION. 30 tablet PRN  . RESTASIS 0.05 % ophthalmic emulsion Place 1 drop into both eyes 2 (two) times daily.   4  . zolpidem (AMBIEN) 5 MG tablet TAKE 1 TABLET BY MOUTH AT BEDTIME AS NEEDED FOR SLEEP. 30 tablet 0   Current Facility-Administered Medications on File Prior to Visit  Medication Dose Route Frequency Provider Last Rate Last Dose  .  0.9 %  sodium chloride infusion   Intravenous Once Adrena E Johnson, PA-C      . sodium chloride 0.9 % injection 10 mL  10 mL Intracatheter PRN Curt Bears, MD   10 mL at 01/05/13 1825  . sodium chloride 0.9 % injection 10 mL  10 mL Intracatheter PRN Curt Bears, MD   10 mL at 06/15/14 1514    Medical History:  Past Medical History  Diagnosis Date  . Hyperkalemia   . Hypothyroidism   . COPD (chronic obstructive pulmonary disease)   . Hyponatremia   . Dizziness   . Fibromyalgia   . Breast cancer   . Multiple myeloma   . Mucositis   . Hx of echocardiogram     a.  Echocardiogram (12/26/2012): EF 44-03%, grade 1 diastolic dysfunction;   b.  Echocardiogram (02/2013): EF 55-60%, no WMA, trivial effusion  . Fibromyalgia   . Arthritis   . Hx of cardiovascular stress test     LexiScan with low level exercise Myoview (02/2013): No ischemia, EF 72%; normal study  . Complication of anesthesia   . PONV (postoperative nausea and vomiting) 2008    after mastestomy  . Myocardial infarction     in past, patient was unaware.   . Anginal pain     used NTG x 2 May 31 and 06/15/13   . GERD (gastroesophageal reflux disease)   . Headache(784.0)   . Anxiety   . Depression   . CKD (chronic kidney disease) stage 3, GFR 30-59 ml/min   . Anemia   . History of blood transfusion     last one May 12   . Neuropathy     Allergies:   Allergies  Allergen Reactions  . Codeine Anaphylaxis  . Latex Shortness Of Breath    Adhesive products   . Other Other (See Comments)    Onion, chocolate causes migraines  . Onion Other (See Comments)    Causes migraine headaches  . Zyprexa [Olanzapine] Other (See Comments)    Confusion , dizzy,unsteady  . Adhesive [Tape] Other (See Comments)    blisters  . Iodinated Diagnostic Agents Itching    Happened 60 years ago  . Sulfa Antibiotics Itching     Review of Systems:  Review of Systems  Constitutional: Positive for malaise/fatigue. Negative for fever and chills.  HENT: Positive for congestion, ear pain and tinnitus. Negative for sore throat.   Eyes: Negative.   Respiratory: Negative for cough, shortness of breath and wheezing.   Cardiovascular: Negative for chest pain, palpitations and leg swelling.  Gastrointestinal: Positive for heartburn, nausea and diarrhea. Negative for vomiting, constipation, blood in stool and melena.  Genitourinary: Negative for dysuria, urgency, frequency and hematuria.  Skin: Negative.   Neurological: Positive for dizziness. Negative for focal weakness, loss of consciousness and headaches.  Endo/Heme/Allergies: Bruises/bleeds easily.    Family history- Review and unchanged  Social history- Review and unchanged  Physical Exam: BP 96/44 mmHg  Pulse 54  Temp(Src) 97.8 F (36.6 C) (Temporal)  Resp 16  Ht _0  (1.651 m)  Wt 157 lb (71.215 kg)  BMI 26.13 kg/m2 Wt Readings from Last 3 Encounters:  08/13/14 157 lb (71.215 kg)  07/26/14 170 lb 8 oz (77.338 kg)  07/12/14 179 lb 1.6 oz (81.239 kg)    General Appearance: Well nourished well developed, in no apparent distress. Eyes: PERRLA, EOMs, conjunctiva no swelling or erythema ENT/Mouth: Ear canals normal without obstruction, swelling, erythma, discharge.  TMs normal bilaterally.  Oropharynx moist, clear, without exudate, or postoropharyngeal swelling. Neck: Supple, thyroid normal,no  cervical adenopathy  Respiratory: Respiratory effort normal, Breath sounds clear A&P without rhonchi, wheeze, or rale.  No retractions, no accessory usage. Cardio: RRR with no MRGs. Brisk peripheral pulses without edema.  Abdomen: Soft, + BS,  Non tender, no guarding, rebound, hernias, masses. Musculoskeletal: Full ROM, 5/5 strength, Normal gait Skin: Warm, dry without rashes, lesions, ecchymosis.  Neuro: Awake and oriented X 3, Cranial nerves intact. Normal muscle tone, no cerebellar symptoms. Psych: Normal affect, Insight and Judgment appropriate.    Starlyn Skeans, PA-C 9:29 AM [facility]

## 2014-08-13 NOTE — Patient Instructions (Signed)
Please stop taking lasix for the next several days until we can get you blood pressure back up to 110/60 or greater.  Please take the immodium as needed for the diarrhea.  Add as much water as you can tolerate and you can also do either gatorade or pedialyte as well.    If your blood pressure drops below 90/40 or less or you have any concerning symptoms please go to the ER.

## 2014-08-14 LAB — INSULIN, RANDOM: Insulin: 7.7 u[IU]/mL (ref 2.0–19.6)

## 2014-08-14 LAB — VITAMIN D 25 HYDROXY (VIT D DEFICIENCY, FRACTURES): Vit D, 25-Hydroxy: 69 ng/mL (ref 30–100)

## 2014-08-16 ENCOUNTER — Other Ambulatory Visit: Payer: Medicare Other

## 2014-08-16 ENCOUNTER — Ambulatory Visit: Payer: Self-pay | Admitting: Internal Medicine

## 2014-08-17 ENCOUNTER — Other Ambulatory Visit: Payer: Self-pay | Admitting: Internal Medicine

## 2014-08-18 ENCOUNTER — Other Ambulatory Visit: Payer: Self-pay | Admitting: Medical Oncology

## 2014-08-18 DIAGNOSIS — R110 Nausea: Secondary | ICD-10-CM

## 2014-08-18 MED ORDER — LORAZEPAM 0.5 MG PO TABS: 0.5000 mg | ORAL_TABLET | Freq: Three times a day (TID) | ORAL | Status: DC | PRN

## 2014-08-23 ENCOUNTER — Other Ambulatory Visit (HOSPITAL_BASED_OUTPATIENT_CLINIC_OR_DEPARTMENT_OTHER): Payer: Medicare Other

## 2014-08-23 ENCOUNTER — Telehealth: Payer: Self-pay | Admitting: Internal Medicine

## 2014-08-23 ENCOUNTER — Encounter: Payer: Self-pay | Admitting: Internal Medicine

## 2014-08-23 ENCOUNTER — Ambulatory Visit (HOSPITAL_BASED_OUTPATIENT_CLINIC_OR_DEPARTMENT_OTHER): Payer: Medicare Other | Admitting: Internal Medicine

## 2014-08-23 ENCOUNTER — Ambulatory Visit: Payer: Medicare Other

## 2014-08-23 ENCOUNTER — Telehealth: Payer: Self-pay | Admitting: Medical Oncology

## 2014-08-23 VITALS — BP 119/52 | HR 82 | Temp 98.5°F | Resp 18 | Ht 65.0 in | Wt 159.4 lb

## 2014-08-23 DIAGNOSIS — D638 Anemia in other chronic diseases classified elsewhere: Secondary | ICD-10-CM | POA: Diagnosis not present

## 2014-08-23 DIAGNOSIS — C9000 Multiple myeloma not having achieved remission: Secondary | ICD-10-CM

## 2014-08-23 DIAGNOSIS — N185 Chronic kidney disease, stage 5: Secondary | ICD-10-CM | POA: Diagnosis not present

## 2014-08-23 DIAGNOSIS — R530 Neoplastic (malignant) related fatigue: Secondary | ICD-10-CM

## 2014-08-23 LAB — CBC WITH DIFFERENTIAL/PLATELET
BASO%: 0.8 % (ref 0.0–2.0)
BASOS ABS: 0.1 10*3/uL (ref 0.0–0.1)
EOS%: 3.7 % (ref 0.0–7.0)
Eosinophils Absolute: 0.2 10*3/uL (ref 0.0–0.5)
HCT: 31.3 % — ABNORMAL LOW (ref 34.8–46.6)
HGB: 10.3 g/dL — ABNORMAL LOW (ref 11.6–15.9)
LYMPH%: 17.4 % (ref 14.0–49.7)
MCH: 34.1 pg — ABNORMAL HIGH (ref 25.1–34.0)
MCHC: 33 g/dL (ref 31.5–36.0)
MCV: 103.3 fL — ABNORMAL HIGH (ref 79.5–101.0)
MONO#: 0.9 10*3/uL (ref 0.1–0.9)
MONO%: 14.1 % — ABNORMAL HIGH (ref 0.0–14.0)
NEUT%: 64 % (ref 38.4–76.8)
NEUTROS ABS: 4.1 10*3/uL (ref 1.5–6.5)
Platelets: 151 10*3/uL (ref 145–400)
RBC: 3.03 10*6/uL — ABNORMAL LOW (ref 3.70–5.45)
RDW: 17.7 % — AB (ref 11.2–14.5)
WBC: 6.3 10*3/uL (ref 3.9–10.3)
lymph#: 1.1 10*3/uL (ref 0.9–3.3)

## 2014-08-23 LAB — COMPREHENSIVE METABOLIC PANEL (CC13)
ALBUMIN: 3.7 g/dL (ref 3.5–5.0)
ALK PHOS: 136 U/L (ref 40–150)
ALT: 19 U/L (ref 0–55)
AST: 21 U/L (ref 5–34)
Anion Gap: 10 mEq/L (ref 3–11)
BILIRUBIN TOTAL: 0.78 mg/dL (ref 0.20–1.20)
BUN: 27.1 mg/dL — ABNORMAL HIGH (ref 7.0–26.0)
CO2: 23 meq/L (ref 22–29)
CREATININE: 2.9 mg/dL — AB (ref 0.6–1.1)
Calcium: 9.3 mg/dL (ref 8.4–10.4)
Chloride: 111 mEq/L — ABNORMAL HIGH (ref 98–109)
EGFR: 15 mL/min/{1.73_m2} — ABNORMAL LOW (ref >90)
Glucose: 79 mg/dl (ref 70–140)
Potassium: 4.3 mEq/L (ref 3.5–5.1)
SODIUM: 144 meq/L (ref 136–145)
TOTAL PROTEIN: 5.8 g/dL — AB (ref 6.4–8.3)

## 2014-08-23 LAB — LACTATE DEHYDROGENASE (CC13): LDH: 227 U/L (ref 125–245)

## 2014-08-23 NOTE — Telephone Encounter (Signed)
Pt confirmed labs/ov per 08/08 POF, gave pt AVS and Calendar..... Nancy Norris

## 2014-08-23 NOTE — Telephone Encounter (Signed)
error 

## 2014-08-23 NOTE — Progress Notes (Signed)
[place] [phone]   [phone]  OFFICE PROGRESS NOTE  [name], MD [address] [place] [address]  PRINCIPAL DIAGNOSES:  1. Recurrent multiple myeloma initially diagnosed in 2002, initially with smoldering myeloma at [facility].  2. Ductal carcinoma in situ status post mastectomy with sentinel lymph node biopsy in October 2008.  PRIOR THERAPY:  1. Status post treatment with tamoxifen from November 2008 through February 2009, discontinued secondary to intolerance. 2. Status post 3 cycles of chemotherapy with Revlimid and Decadron followed by 1 cycle of Decadron only with mild response. 3. Status post 2 cycles of systemic chemotherapy with Velcade, Doxil and Decadron discontinued secondary to significant peripheral neuropathy. Last dose was given May 2010 at [facility]. 4. Status post autologous peripheral blood stem cell transplant on October 01, 2008 at [facility] under the care of Dr. Phyllis Ginger.  5. Systemic chemotherapy with Carfilzomib initially was 20 mg/M2 and will be increased after cycle #1 to 36 mg/M2 on days 1, 2, 8, 9, 15 and 16 every 4 weeks in addition to Cytoxan 300 mg/M2 and Decadron 40 mg by mouth weekly basis, status post 4 cycles. First cycle on 12/29/2012.   CURRENT THERAPY: She resumed chemotherapy with Carfilzomib, Cytoxan, and Decadron on 05/03/2014. Status post 4 cycles  INTERVAL HISTORY: Nancy Norris 74 y.o. female returns to the clinic today for follow-up visit. The patient tolerated the previous treatment fairly well except for fatigue secondary to chemotherapy and chronic anemia. She denied having any significant chest pain, shortness of breath, cough or hemoptysis. She denied having any bleeding issues. The patient denied having any significant nausea or vomiting. She has no fever or chills. She has no significant weight loss or night sweats. The  patient had repeat myeloma panel performed earlier today and she is here for evaluation and discussion of her lab results. She is also interested in taking a break off chemotherapy because of transportation issues.  MEDICAL HISTORY: Past Medical History  Diagnosis Date  . Hyperkalemia   . Hypothyroidism   . COPD (chronic obstructive pulmonary disease)   . Hyponatremia   . Dizziness   . Fibromyalgia   . Breast cancer   . Multiple myeloma   . Mucositis   . Hx of echocardiogram     a.  Echocardiogram (12/26/2012): EF 70-62%, grade 1 diastolic dysfunction;   b.  Echocardiogram (02/2013): EF 55-60%, no WMA, trivial effusion  . Fibromyalgia   . Arthritis   . Hx of cardiovascular stress test     LexiScan with low level exercise Myoview (02/2013): No ischemia, EF 72%; normal study  . Complication of anesthesia   . PONV (postoperative nausea and vomiting) 2008    after mastestomy  . Myocardial infarction     in past, patient was unaware.   . Anginal pain     used NTG x 2 May 31 and 06/15/13   . GERD (gastroesophageal reflux disease)   . Headache(784.0)   . Anxiety   . Depression   . CKD (chronic kidney disease) stage 3, GFR 30-59 ml/min   . Anemia   . History of blood transfusion     last one May 12   . Neuropathy     ALLERGIES:  is allergic to codeine; latex; other; onion; zyprexa; adhesive; iodinated diagnostic agents; and sulfa antibiotics.  MEDICATIONS:  Current Outpatient Prescriptions  Medication Sig Dispense Refill  . albuterol (PROVENTIL HFA;VENTOLIN HFA)  108 (90 BASE) MCG/ACT inhaler Inhale 2 puffs into the lungs 2 (two) times daily as needed for wheezing or shortness of breath. 1 each 6  . ascorbic acid (VITAMIN C) 250 MG CHEW Chew 250 mg by mouth daily.    Marland Kitchen aspirin EC 81 MG tablet Take 1 tablet (81 mg total) by mouth daily.    Marland Kitchen buPROPion (WELLBUTRIN XL) 300 MG 24 hr tablet Take 300 mg by mouth daily.   98  . carboxymethylcellulose (REFRESH PLUS) 0.5 % SOLN Place 1 drop  into both eyes every 2 (two) hours.    . Cholecalciferol 4000 UNITS TABS Take 4,000 Units by mouth daily.    . citalopram (CELEXA) 40 MG tablet TAKE 1 TABLET BY MOUTH ONCE DAILY. 90 tablet 1  . cromolyn (OPTICROM) 4 % ophthalmic solution Place 1 drop into both eyes 4 (four) times daily.    Marland Kitchen esomeprazole (NEXIUM) 40 MG capsule TAKE 1 CAPSULE BY MOUTH DAILY, EACH MORNING FOR ACID INDIGESTION 30 capsule PRN  . Ferrous Sulfate 134 MG TABS Take 1 tablet (134 mg total) by mouth every other day. 60 tablet 2  . fluticasone (FLONASE) 50 MCG/ACT nasal spray Place 2 sprays into both nostrils at bedtime.     . Fluticasone-Salmeterol (ADVAIR) 100-50 MCG/DOSE AEPB Inhale 1 puff into the lungs 2 (two) times daily. 60 each 3  . furosemide (LASIX) 40 MG tablet Take 1 tablet (40 mg total) by mouth daily. 30 tablet 1  . gabapentin (NEURONTIN) 300 MG capsule TAKE 1 CAPSULE BY MOUTH AT BEDTIME. 90 capsule 3  . isosorbide mononitrate (IMDUR) 30 MG 24 hr tablet TAKE 1 TABLET BY MOUTH DAILY. 30 tablet 11  . levothyroxine (SYNTHROID, LEVOTHROID) 150 MCG tablet TAKE 1 TABLET BY MOUTH ONCE DAILY. 90 tablet 1  . loratadine (CLARITIN) 10 MG tablet Take 10 mg by mouth daily.    Marland Kitchen LORazepam (ATIVAN) 0.5 MG tablet Take 1 tablet (0.5 mg total) by mouth every 8 (eight) hours as needed (nausea). 30 tablet 0  . loteprednol (ALREX) 0.2 % SUSP Place 1 drop into both eyes 4 (four) times daily. 1 Bottle PRN  . meclizine (ANTIVERT) 32 MG tablet Take 1 tablet (32 mg total) by mouth 3 (three) times daily as needed. 30 tablet 0  . midodrine (PROAMATINE) 10 MG tablet Take 1 tablet (10 mg total) by mouth 3 (three) times daily. 90 tablet 11  . nitroGLYCERIN (NITROSTAT) 0.4 MG SL tablet Place 1 tablet (0.4 mg total) under the tongue every 5 (five) minutes as needed for chest pain. 25 tablet 3  . ondansetron (ZOFRAN-ODT) 8 MG disintegrating tablet Take 1 tablet (8 mg total) by mouth every 8 (eight) hours as needed for nausea or vomiting. 20  tablet 3  . pilocarpine (SALAGEN) 5 MG tablet TAKE 1 TABLET BY MOUTH 3 TIMES DAILY. 90 tablet 0  . ranitidine (ZANTAC) 300 MG tablet TAKE 1 TABLET BY MOUTH AT BEDTIME, FOR ACID REFLUX AND INDIGESTION. 30 tablet PRN  . RESTASIS 0.05 % ophthalmic emulsion Place 1 drop into both eyes 2 (two) times daily.   4  . acetaminophen (TYLENOL) 325 MG tablet Take 650 mg by mouth every 6 (six) hours as needed for mild pain or moderate pain.     Marland Kitchen dexamethasone (DECADRON) 4 MG tablet Take 10 tablets by mouth once a week as directed (Patient not taking: Reported on 08/23/2014) 40 tablet 3  . loperamide (IMODIUM) 2 MG capsule Take two tabs po initially, then one tab after  each loose stool: max 8 tabs in 24 hours (Patient not taking: Reported on 08/23/2014) 30 capsule 0  . nystatin (MYCOSTATIN/NYSTOP) 100000 UNIT/GM POWD Apply 1 g topically 2 (two) times daily. Apply to belly    . polyethylene glycol (MIRALAX / GLYCOLAX) packet Take 17 g by mouth daily.    . prochlorperazine (COMPAZINE) 10 MG tablet TAKE 1 TABLET BY MOUTH EVERY 6 HOURS AS NEEDED FOR NAUSEA OR VOMITING. (Patient not taking: Reported on 08/23/2014) 60 tablet 0   No current facility-administered medications for this visit.   Facility-Administered Medications Ordered in Other Visits  Medication Dose Route Frequency Provider Last Rate Last Dose  . 0.9 %  sodium chloride infusion   Intravenous Once Adrena E Johnson, PA-C      . sodium chloride 0.9 % injection 10 mL  10 mL Intracatheter PRN Curt Bears, MD   10 mL at 01/05/13 1825  . sodium chloride 0.9 % injection 10 mL  10 mL Intracatheter PRN Curt Bears, MD   10 mL at 06/15/14 1514    SURGICAL HISTORY:  Past Surgical History  Procedure Laterality Date  . History of port removal    . Status post stem cell transplant on September 28, 2008.    Marland Kitchen Abdominal hysterectomy  1981  . Cholecystectomy  1971  . Mastectomy Left 2008  . Cataract extraction, bilateral    . Breast surgery    . Eye surgery  Bilateral     lens implant  . Breast reconstruction    . Portacath placement  12/2012    has had 2  . Av fistula placement Left 06/19/2013    Procedure: CREATION OF LEFT ARM ARTERIOVENOUS (AV) FISTULA ;  Surgeon: Angelia Mould, MD;  Location: [facility] OR;  Service: Vascular;  Laterality: Left;    REVIEW OF SYSTEMS:  Constitutional: negative Eyes: negative Ears, nose, mouth, throat, and face: negative Respiratory: negative Cardiovascular: negative Gastrointestinal: negative Genitourinary:negative Integument/breast: negative Hematologic/lymphatic: negative Musculoskeletal:negative Neurological: negative Behavioral/Psych: negative Endocrine: negative Allergic/Immunologic: negative   PHYSICAL EXAMINATION: General appearance: alert, cooperative and no distress Head: Normocephalic, without obvious abnormality, atraumatic Neck: no adenopathy, no JVD, supple, symmetrical, trachea midline and thyroid not enlarged, symmetric, no tenderness/mass/nodules Lymph nodes: Cervical, supraclavicular, and axillary nodes normal. Resp: clear to auscultation bilaterally Back: symmetric, no curvature. ROM normal. No CVA tenderness. Cardio: regular rate and rhythm, S1, S2 normal, no murmur, click, rub or gallop GI: soft, non-tender; bowel sounds normal; no masses,  no organomegaly Extremities: extremities normal, atraumatic, no cyanosis or edema Neurologic: Alert and oriented X 3, normal strength and tone. Normal symmetric reflexes. Normal coordination and gait  ECOG PERFORMANCE STATUS: 1 - Symptomatic but completely ambulatory  Blood pressure 119/52, pulse 82, temperature 98.5 F (36.9 C), temperature source Oral, resp. rate 18, height $RemoveBe'5\' 5"'NqvhxdEgr$  (1.651 m), weight 159 lb 6.4 oz (72.303 kg), SpO2 99 %.  LABORATORY DATA: Lab Results  Component Value Date   WBC 6.3 08/23/2014   HGB 10.3* 08/23/2014   HCT 31.3* 08/23/2014   MCV 103.3* 08/23/2014   PLT 151 08/23/2014      Chemistry      Component  Value Date/Time   NA 137 08/13/2014 0959   NA 141 08/09/2014 1237   K 3.6 08/13/2014 0959   K 4.9 08/09/2014 1237   CL 105 08/13/2014 0959   CL 105 03/19/2012 0811   CO2 23 08/13/2014 0959   CO2 24 08/09/2014 1237   BUN 40* 08/13/2014 0959   BUN 41.7* 08/09/2014  1237   CREATININE 3.20* 08/13/2014 0959   CREATININE 3.5* 08/09/2014 1237   CREATININE 4.29* 04/03/2014 0306      Component Value Date/Time   CALCIUM 8.6 08/13/2014 0959   CALCIUM 9.6 08/09/2014 1237   ALKPHOS 113 08/13/2014 0959   ALKPHOS 127 08/09/2014 1237   AST 16 08/13/2014 0959   AST 16 08/09/2014 1237   ALT 13 08/13/2014 0959   ALT 18 08/09/2014 1237   BILITOT 1.1 08/13/2014 0959   BILITOT 0.75 08/09/2014 1237       RADIOGRAPHIC STUDIES: No results found.  ASSESSMENT AND PLAN: This is a very pleasant 74 years old white female with history of multiple myeloma as well as chronic kidney disease status post several chemotherapy regimen and most recently treated with Carfilzomib, Cytoxan and Decadron status post 4 cycles. The patient tolerated the treatment well except for fatigue secondary to anemia of chronic disease. She required PRBC transfusion few times during her treatment. She had repeat myeloma panel performed earlier today but the results are still pending. The patient is interested in taking a break off chemotherapy secondary to transportation issues and also because of her fatigue. I had a lengthy discussion with the patient today about her current condition and I recommended for her to take a break for the next 2 months unless the myeloma panel performed earlier today showed evidence for disease progression. For the chronic kidney disease, the patient will continue her routine follow-up visit with her nephrologist. The patient was advised to call immediately if she has any concerning symptoms in the interval. The patient voices understanding of current disease status and treatment options and is in  agreement with the current care plan.  All questions were answered. The patient knows to call the clinic with any problems, questions or concerns. We can certainly see the patient much sooner if necessary.  I spent 15 minutes counseling the patient face to face. The total time spent in the appointment was 25 minutes.  Disclaimer: This note was dictated with voice recognition software. Similar sounding words can inadvertently be transcribed and may not be corrected upon review.

## 2014-08-23 NOTE — Telephone Encounter (Signed)
Pt confirmed labs/ov per 08/08 POF, gave pt AVS and Calendar..... KJ, cancelled all apts until Oct per MD

## 2014-08-24 ENCOUNTER — Ambulatory Visit: Payer: Medicare Other

## 2014-08-25 LAB — KAPPA/LAMBDA LIGHT CHAINS
Kappa free light chain: 2.19 mg/dL — ABNORMAL HIGH (ref 0.33–1.94)
Kappa:Lambda Ratio: 0.83 (ref 0.26–1.65)
Lambda Free Lght Chn: 2.63 mg/dL (ref 0.57–2.63)

## 2014-08-25 LAB — IGG, IGA, IGM
IGM, SERUM: 22 mg/dL — AB (ref 52–322)
IgA: 7 mg/dL — ABNORMAL LOW (ref 69–380)
IgG (Immunoglobin G), Serum: 298 mg/dL — ABNORMAL LOW (ref 690–1700)

## 2014-08-25 LAB — BETA 2 MICROGLOBULIN, SERUM: BETA 2 MICROGLOBULIN: 12.6 mg/L — AB (ref <=2.51)

## 2014-08-30 ENCOUNTER — Other Ambulatory Visit: Payer: Medicare Other

## 2014-08-30 ENCOUNTER — Ambulatory Visit: Payer: Medicare Other

## 2014-08-31 ENCOUNTER — Ambulatory Visit: Payer: Medicare Other

## 2014-09-06 ENCOUNTER — Ambulatory Visit: Payer: Medicare Other

## 2014-09-06 ENCOUNTER — Other Ambulatory Visit: Payer: Medicare Other

## 2014-09-07 ENCOUNTER — Ambulatory Visit: Payer: Medicare Other

## 2014-09-29 DIAGNOSIS — N2581 Secondary hyperparathyroidism of renal origin: Secondary | ICD-10-CM | POA: Diagnosis not present

## 2014-09-29 DIAGNOSIS — N184 Chronic kidney disease, stage 4 (severe): Secondary | ICD-10-CM | POA: Diagnosis not present

## 2014-09-29 DIAGNOSIS — D630 Anemia in neoplastic disease: Secondary | ICD-10-CM | POA: Diagnosis not present

## 2014-09-29 DIAGNOSIS — R809 Proteinuria, unspecified: Secondary | ICD-10-CM | POA: Diagnosis not present

## 2014-09-29 DIAGNOSIS — I951 Orthostatic hypotension: Secondary | ICD-10-CM | POA: Diagnosis not present

## 2014-10-15 ENCOUNTER — Other Ambulatory Visit (HOSPITAL_BASED_OUTPATIENT_CLINIC_OR_DEPARTMENT_OTHER): Payer: Medicare Other

## 2014-10-15 DIAGNOSIS — N185 Chronic kidney disease, stage 5: Secondary | ICD-10-CM

## 2014-10-15 DIAGNOSIS — C9000 Multiple myeloma not having achieved remission: Secondary | ICD-10-CM

## 2014-10-15 LAB — CBC WITH DIFFERENTIAL/PLATELET
BASO%: 0.4 % (ref 0.0–2.0)
Basophils Absolute: 0 10*3/uL (ref 0.0–0.1)
EOS%: 3.7 % (ref 0.0–7.0)
Eosinophils Absolute: 0.2 10*3/uL (ref 0.0–0.5)
HCT: 30 % — ABNORMAL LOW (ref 34.8–46.6)
HGB: 9.5 g/dL — ABNORMAL LOW (ref 11.6–15.9)
LYMPH%: 18 % (ref 14.0–49.7)
MCH: 34.7 pg — AB (ref 25.1–34.0)
MCHC: 31.7 g/dL (ref 31.5–36.0)
MCV: 109.5 fL — ABNORMAL HIGH (ref 79.5–101.0)
MONO#: 0.8 10*3/uL (ref 0.1–0.9)
MONO%: 14.4 % — ABNORMAL HIGH (ref 0.0–14.0)
NEUT#: 3.6 10*3/uL (ref 1.5–6.5)
NEUT%: 63.5 % (ref 38.4–76.8)
Platelets: 149 10*3/uL (ref 145–400)
RBC: 2.74 10*6/uL — ABNORMAL LOW (ref 3.70–5.45)
RDW: 13 % (ref 11.2–14.5)
WBC: 5.7 10*3/uL (ref 3.9–10.3)
lymph#: 1 10*3/uL (ref 0.9–3.3)

## 2014-10-15 LAB — LACTATE DEHYDROGENASE (CC13): LDH: 143 U/L (ref 125–245)

## 2014-10-15 LAB — COMPREHENSIVE METABOLIC PANEL (CC13)
ALBUMIN: 4.1 g/dL (ref 3.5–5.0)
ALK PHOS: 94 U/L (ref 40–150)
ALT: 22 U/L (ref 0–55)
AST: 18 U/L (ref 5–34)
Anion Gap: 11 mEq/L (ref 3–11)
BUN: 39.5 mg/dL — AB (ref 7.0–26.0)
CALCIUM: 10.6 mg/dL — AB (ref 8.4–10.4)
CO2: 23 mEq/L (ref 22–29)
Chloride: 108 mEq/L (ref 98–109)
Creatinine: 4.4 mg/dL (ref 0.6–1.1)
EGFR: 9 mL/min/{1.73_m2} — AB (ref >90)
Glucose: 113 mg/dl (ref 70–140)
Potassium: 4.1 mEq/L (ref 3.5–5.1)
Sodium: 142 mEq/L (ref 136–145)
Total Bilirubin: 0.43 mg/dL (ref 0.20–1.20)
Total Protein: 6.4 g/dL (ref 6.4–8.3)

## 2014-10-18 LAB — KAPPA/LAMBDA LIGHT CHAINS
Kappa free light chain: 4.59 mg/dL — ABNORMAL HIGH (ref 0.33–1.94)
Kappa:Lambda Ratio: 4.68 — ABNORMAL HIGH (ref 0.26–1.65)
Lambda Free Lght Chn: 0.98 mg/dL (ref 0.57–2.63)

## 2014-10-18 LAB — IGG, IGA, IGM
IGG (IMMUNOGLOBIN G), SERUM: 315 mg/dL — AB (ref 690–1700)
IgA: 10 mg/dL — ABNORMAL LOW (ref 69–380)
IgM, Serum: 20 mg/dL — ABNORMAL LOW (ref 52–322)

## 2014-10-18 LAB — BETA 2 MICROGLOBULIN, SERUM: Beta-2 Microglobulin: 13.4 mg/L — ABNORMAL HIGH (ref <=2.51)

## 2014-10-21 ENCOUNTER — Ambulatory Visit (HOSPITAL_BASED_OUTPATIENT_CLINIC_OR_DEPARTMENT_OTHER): Payer: Medicare Other | Admitting: Internal Medicine

## 2014-10-21 ENCOUNTER — Encounter: Payer: Self-pay | Admitting: Internal Medicine

## 2014-10-21 ENCOUNTER — Telehealth: Payer: Self-pay | Admitting: Internal Medicine

## 2014-10-21 VITALS — BP 120/58 | HR 76 | Temp 98.4°F | Resp 18 | Ht 65.0 in | Wt 156.0 lb

## 2014-10-21 DIAGNOSIS — N189 Chronic kidney disease, unspecified: Secondary | ICD-10-CM

## 2014-10-21 DIAGNOSIS — C9000 Multiple myeloma not having achieved remission: Secondary | ICD-10-CM | POA: Diagnosis not present

## 2014-10-21 DIAGNOSIS — Z5111 Encounter for antineoplastic chemotherapy: Secondary | ICD-10-CM

## 2014-10-21 NOTE — Progress Notes (Signed)
[facility] [phone]   Fax:([phone]) [phone]  OFFICE PROGRESS NOTE  [name], MD [address] [place] [address]  PRINCIPAL DIAGNOSES:  1. Recurrent multiple myeloma initially diagnosed in 2002, initially with smoldering myeloma at [facility].  2. Ductal carcinoma in situ status post mastectomy with sentinel lymph node biopsy in October 2008.  PRIOR THERAPY:  1. Status post treatment with tamoxifen from November 2008 through February 2009, discontinued secondary to intolerance. 2. Status post 3 cycles of chemotherapy with Revlimid and Decadron followed by 1 cycle of Decadron only with mild response. 3. Status post 2 cycles of systemic chemotherapy with Velcade, Doxil and Decadron discontinued secondary to significant peripheral neuropathy. Last dose was given May 2010 at [facility]. 4. Status post autologous peripheral blood stem cell transplant on October 01, 2008 at [facility] under the care of Dr. Phyllis Ginger.  5. Systemic chemotherapy with Carfilzomib initially was 20 mg/M2 and will be increased after cycle #1 to 36 mg/M2 on days 1, 2, 8, 9, 15 and 16 every 4 weeks in addition to Cytoxan 300 mg/M2 and Decadron 40 mg by mouth weekly basis, status post 4 cycles. First cycle on 12/29/2012.   CURRENT THERAPY: She resumed chemotherapy with Carfilzomib, Cytoxan, and Decadron on 05/03/2014. Status post 4 cycles.   INTERVAL HISTORY: Nancy Norris 74 y.o. female returns to the clinic today for follow-up visit. The patient has been observation for the last 2 months. She has been doing fine with no specific complaints. She denied having any significant chest pain, shortness of breath, cough or hemoptysis. She denied having any bleeding issues. The patient denied having any significant nausea or vomiting. She has no fever or chills. She has no significant weight loss or night sweats. She had a  myeloma panel performed recently and she is here for evaluation and discussion of her lab results. She is also interested in resuming her treatment.  MEDICAL HISTORY: Past Medical History  Diagnosis Date  . Hyperkalemia   . Hypothyroidism   . COPD (chronic obstructive pulmonary disease)   . Hyponatremia   . Dizziness   . Fibromyalgia   . Breast cancer   . Multiple myeloma   . Mucositis   . Hx of echocardiogram     a.  Echocardiogram (12/26/2012): EF 63-01%, grade 1 diastolic dysfunction;   b.  Echocardiogram (02/2013): EF 55-60%, no WMA, trivial effusion  . Fibromyalgia   . Arthritis   . Hx of cardiovascular stress test     LexiScan with low level exercise Myoview (02/2013): No ischemia, EF 72%; normal study  . Complication of anesthesia   . PONV (postoperative nausea and vomiting) 2008    after mastestomy  . Myocardial infarction     in past, patient was unaware.   . Anginal pain     used NTG x 2 May 31 and 06/15/13   . GERD (gastroesophageal reflux disease)   . Headache(784.0)   . Anxiety   . Depression   . CKD (chronic kidney disease) stage 3, GFR 30-59 ml/min   . Anemia   . History of blood transfusion     last one May 12   . Neuropathy     ALLERGIES:  is allergic to codeine; latex; other; onion; zyprexa; adhesive; iodinated diagnostic agents; and sulfa antibiotics.  MEDICATIONS:  Current Outpatient Prescriptions  Medication Sig Dispense Refill  . acetaminophen (TYLENOL) 325 MG tablet Take 650 mg by  mouth every 6 (six) hours as needed for mild pain or moderate pain.     Marland Kitchen albuterol (PROVENTIL HFA;VENTOLIN HFA) 108 (90 BASE) MCG/ACT inhaler Inhale 2 puffs into the lungs 2 (two) times daily as needed for wheezing or shortness of breath. 1 each 6  . ascorbic acid (VITAMIN C) 250 MG CHEW Chew 250 mg by mouth daily.    Marland Kitchen aspirin EC 81 MG tablet Take 1 tablet (81 mg total) by mouth daily.    Marland Kitchen buPROPion (WELLBUTRIN XL) 300 MG 24 hr tablet Take 300 mg by mouth daily.   98  .  carboxymethylcellulose (REFRESH PLUS) 0.5 % SOLN Place 1 drop into both eyes every 2 (two) hours.    . Cholecalciferol 4000 UNITS TABS Take 4,000 Units by mouth daily.    . citalopram (CELEXA) 40 MG tablet TAKE 1 TABLET BY MOUTH ONCE DAILY. 90 tablet 1  . cromolyn (OPTICROM) 4 % ophthalmic solution Place 1 drop into both eyes 4 (four) times daily.    Marland Kitchen dexamethasone (DECADRON) 4 MG tablet Take 10 tablets by mouth once a week as directed (Patient not taking: Reported on 08/23/2014) 40 tablet 3  . esomeprazole (NEXIUM) 40 MG capsule TAKE 1 CAPSULE BY MOUTH DAILY, EACH MORNING FOR ACID INDIGESTION 30 capsule PRN  . Ferrous Sulfate 134 MG TABS Take 1 tablet (134 mg total) by mouth every other day. 60 tablet 2  . fluticasone (FLONASE) 50 MCG/ACT nasal spray Place 2 sprays into both nostrils at bedtime.     . Fluticasone-Salmeterol (ADVAIR) 100-50 MCG/DOSE AEPB Inhale 1 puff into the lungs 2 (two) times daily. 60 each 3  . furosemide (LASIX) 40 MG tablet Take 1 tablet (40 mg total) by mouth daily. 30 tablet 1  . gabapentin (NEURONTIN) 300 MG capsule TAKE 1 CAPSULE BY MOUTH AT BEDTIME. 90 capsule 3  . isosorbide mononitrate (IMDUR) 30 MG 24 hr tablet TAKE 1 TABLET BY MOUTH DAILY. 30 tablet 11  . levothyroxine (SYNTHROID, LEVOTHROID) 150 MCG tablet TAKE 1 TABLET BY MOUTH ONCE DAILY. 90 tablet 1  . loperamide (IMODIUM) 2 MG capsule Take two tabs po initially, then one tab after each loose stool: max 8 tabs in 24 hours (Patient not taking: Reported on 08/23/2014) 30 capsule 0  . loratadine (CLARITIN) 10 MG tablet Take 10 mg by mouth daily.    Marland Kitchen LORazepam (ATIVAN) 0.5 MG tablet Take 1 tablet (0.5 mg total) by mouth every 8 (eight) hours as needed (nausea). 30 tablet 0  . loteprednol (ALREX) 0.2 % SUSP Place 1 drop into both eyes 4 (four) times daily. 1 Bottle PRN  . meclizine (ANTIVERT) 32 MG tablet Take 1 tablet (32 mg total) by mouth 3 (three) times daily as needed. 30 tablet 0  . midodrine (PROAMATINE) 10 MG  tablet Take 1 tablet (10 mg total) by mouth 3 (three) times daily. 90 tablet 11  . nitroGLYCERIN (NITROSTAT) 0.4 MG SL tablet Place 1 tablet (0.4 mg total) under the tongue every 5 (five) minutes as needed for chest pain. 25 tablet 3  . nystatin (MYCOSTATIN/NYSTOP) 100000 UNIT/GM POWD Apply 1 g topically 2 (two) times daily. Apply to belly    . ondansetron (ZOFRAN-ODT) 8 MG disintegrating tablet Take 1 tablet (8 mg total) by mouth every 8 (eight) hours as needed for nausea or vomiting. 20 tablet 3  . pilocarpine (SALAGEN) 5 MG tablet TAKE 1 TABLET BY MOUTH 3 TIMES DAILY. 90 tablet 0  . polyethylene glycol (MIRALAX / GLYCOLAX) packet Take  17 g by mouth daily.    . prochlorperazine (COMPAZINE) 10 MG tablet TAKE 1 TABLET BY MOUTH EVERY 6 HOURS AS NEEDED FOR NAUSEA OR VOMITING. (Patient not taking: Reported on 08/23/2014) 60 tablet 0  . ranitidine (ZANTAC) 300 MG tablet TAKE 1 TABLET BY MOUTH AT BEDTIME, FOR ACID REFLUX AND INDIGESTION. 30 tablet PRN  . RESTASIS 0.05 % ophthalmic emulsion Place 1 drop into both eyes 2 (two) times daily.   4   No current facility-administered medications for this visit.   Facility-Administered Medications Ordered in Other Visits  Medication Dose Route Frequency Provider Last Rate Last Dose  . 0.9 %  sodium chloride infusion   Intravenous Once Adrena E Johnson, PA-C      . sodium chloride 0.9 % injection 10 mL  10 mL Intracatheter PRN Curt Bears, MD   10 mL at 01/05/13 1825  . sodium chloride 0.9 % injection 10 mL  10 mL Intracatheter PRN Curt Bears, MD   10 mL at 06/15/14 1514    SURGICAL HISTORY:  Past Surgical History  Procedure Laterality Date  . History of port removal    . Status post stem cell transplant on September 28, 2008.    Marland Kitchen Abdominal hysterectomy  1981  . Cholecystectomy  1971  . Mastectomy Left 2008  . Cataract extraction, bilateral    . Breast surgery    . Eye surgery Bilateral     lens implant  . Breast reconstruction    .  Portacath placement  12/2012    has had 2  . Av fistula placement Left 06/19/2013    Procedure: CREATION OF LEFT ARM ARTERIOVENOUS (AV) FISTULA ;  Surgeon: Angelia Mould, MD;  Location: [facility] OR;  Service: Vascular;  Laterality: Left;    REVIEW OF SYSTEMS:  Constitutional: negative Eyes: negative Ears, nose, mouth, throat, and face: negative Respiratory: negative Cardiovascular: negative Gastrointestinal: negative Genitourinary:negative Integument/breast: negative Hematologic/lymphatic: negative Musculoskeletal:negative Neurological: negative Behavioral/Psych: negative Endocrine: negative Allergic/Immunologic: negative   PHYSICAL EXAMINATION: General appearance: alert, cooperative and no distress Head: Normocephalic, without obvious abnormality, atraumatic Neck: no adenopathy, no JVD, supple, symmetrical, trachea midline and thyroid not enlarged, symmetric, no tenderness/mass/nodules Lymph nodes: Cervical, supraclavicular, and axillary nodes normal. Resp: clear to auscultation bilaterally Back: symmetric, no curvature. ROM normal. No CVA tenderness. Cardio: regular rate and rhythm, S1, S2 normal, no murmur, click, rub or gallop GI: soft, non-tender; bowel sounds normal; no masses,  no organomegaly Extremities: extremities normal, atraumatic, no cyanosis or edema Neurologic: Alert and oriented X 3, normal strength and tone. Normal symmetric reflexes. Normal coordination and gait  ECOG PERFORMANCE STATUS: 1 - Symptomatic but completely ambulatory  There were no vitals taken for this visit.  LABORATORY DATA: Lab Results  Component Value Date   WBC 5.7 10/15/2014   HGB 9.5* 10/15/2014   HCT 30.0* 10/15/2014   MCV 109.5* 10/15/2014   PLT 149 10/15/2014      Chemistry      Component Value Date/Time   NA 142 10/15/2014 0851   NA 137 08/13/2014 0959   K 4.1 10/15/2014 0851   K 3.6 08/13/2014 0959   CL 105 08/13/2014 0959   CL 105 03/19/2012 0811   CO2 23 10/15/2014  0851   CO2 23 08/13/2014 0959   BUN 39.5* 10/15/2014 0851   BUN 40* 08/13/2014 0959   CREATININE 4.4* 10/15/2014 0851   CREATININE 3.20* 08/13/2014 0959   CREATININE 4.29* 04/03/2014 0306      Component Value Date/Time  CALCIUM 10.6* 10/15/2014 0851   CALCIUM 8.6 08/13/2014 0959   ALKPHOS 94 10/15/2014 0851   ALKPHOS 113 08/13/2014 0959   AST 18 10/15/2014 0851   AST 16 08/13/2014 0959   ALT 22 10/15/2014 0851   ALT 13 08/13/2014 0959   BILITOT 0.43 10/15/2014 0851   BILITOT 1.1 08/13/2014 0959     Myeloma panel: Beta-2 microglobulin 13.40, free kappa light chain 4.59, free lambda light chain 0.98,/lambda ratio 4.68. IgG 315, IgA 10 and IgM 20.  RADIOGRAPHIC STUDIES: No results found.  ASSESSMENT AND PLAN: This is a very pleasant 74 years old white female with history of multiple myeloma as well as chronic kidney disease status post several chemotherapy regimen and most recently treated with Carfilzomib, Cytoxan and Decadron status post 4 cycles.  The myeloma panel performed recently showed some evidence for disease progression with increase in the free Light Chain. I discussed the lab result with the patient today. She is interested in resuming her systemic chemotherapy. She will start treatment again with Carfilzomib, Cytoxan and Decadron on 11/01/2014. I will see her back for follow-up visit in 4 weeks for reevaluation and management of any adverse effect of her treatment. For the chronic kidney disease, the patient will continue her routine follow-up visit with her nephrologist. The patient was advised to call immediately if she has any concerning symptoms in the interval. The patient voices understanding of current disease status and treatment options and is in agreement with the current care plan.  All questions were answered. The patient knows to call the clinic with any problems, questions or concerns. We can certainly see the patient much sooner if  necessary.  Disclaimer: This note was dictated with voice recognition software. Similar sounding words can inadvertently be transcribed and may not be corrected upon review.

## 2014-10-21 NOTE — Telephone Encounter (Signed)
GAVE AND PRINTED APPT SCHED AND AVS FOR PT FOR OCT THRU DEC

## 2014-10-22 ENCOUNTER — Encounter: Payer: Self-pay | Admitting: *Deleted

## 2014-10-22 NOTE — Progress Notes (Signed)
[place] Work  Clinical Social Work was referred by patient for assessment of psychosocial needs due to transportation concerns and request for assistance with SCAT application.  Clinical Social Worker phoned patient at home and left brief message explaining SCAT process and that CSW could assist with application process. CSW encouraged pt to return call to set up appt to complete application at next appt at [facility].    Clinical Social Work interventions: Resource education Loren Racer, [place] Worker [place]  [place] Phone: [phone] Fax: [phone]

## 2014-11-01 ENCOUNTER — Ambulatory Visit (HOSPITAL_BASED_OUTPATIENT_CLINIC_OR_DEPARTMENT_OTHER): Payer: Medicare Other | Admitting: Nurse Practitioner

## 2014-11-01 ENCOUNTER — Ambulatory Visit (HOSPITAL_BASED_OUTPATIENT_CLINIC_OR_DEPARTMENT_OTHER): Payer: Medicare Other

## 2014-11-01 ENCOUNTER — Other Ambulatory Visit: Payer: Self-pay

## 2014-11-01 ENCOUNTER — Other Ambulatory Visit (HOSPITAL_BASED_OUTPATIENT_CLINIC_OR_DEPARTMENT_OTHER): Payer: Medicare Other

## 2014-11-01 VITALS — BP 112/44 | HR 59 | Temp 98.0°F | Resp 20

## 2014-11-01 DIAGNOSIS — Z853 Personal history of malignant neoplasm of breast: Secondary | ICD-10-CM

## 2014-11-01 DIAGNOSIS — Z5111 Encounter for antineoplastic chemotherapy: Secondary | ICD-10-CM

## 2014-11-01 DIAGNOSIS — C9001 Multiple myeloma in remission: Secondary | ICD-10-CM

## 2014-11-01 DIAGNOSIS — C9002 Multiple myeloma in relapse: Secondary | ICD-10-CM

## 2014-11-01 DIAGNOSIS — C9000 Multiple myeloma not having achieved remission: Secondary | ICD-10-CM

## 2014-11-01 DIAGNOSIS — T7840XA Allergy, unspecified, initial encounter: Secondary | ICD-10-CM

## 2014-11-01 DIAGNOSIS — Z5112 Encounter for antineoplastic immunotherapy: Secondary | ICD-10-CM | POA: Diagnosis present

## 2014-11-01 LAB — COMPREHENSIVE METABOLIC PANEL (CC13)
ALBUMIN: 3.9 g/dL (ref 3.5–5.0)
ALK PHOS: 82 U/L (ref 40–150)
ALT: 17 U/L (ref 0–55)
AST: 17 U/L (ref 5–34)
Anion Gap: 8 mEq/L (ref 3–11)
BILIRUBIN TOTAL: 0.52 mg/dL (ref 0.20–1.20)
BUN: 31.3 mg/dL — AB (ref 7.0–26.0)
CALCIUM: 8.9 mg/dL (ref 8.4–10.4)
CO2: 20 mEq/L — ABNORMAL LOW (ref 22–29)
CREATININE: 3.3 mg/dL — AB (ref 0.6–1.1)
Chloride: 114 mEq/L — ABNORMAL HIGH (ref 98–109)
EGFR: 13 mL/min/{1.73_m2} — ABNORMAL LOW (ref >90)
GLUCOSE: 102 mg/dL (ref 70–140)
POTASSIUM: 4.2 meq/L (ref 3.5–5.1)
SODIUM: 141 meq/L (ref 136–145)
TOTAL PROTEIN: 6.1 g/dL — AB (ref 6.4–8.3)

## 2014-11-01 LAB — CBC WITH DIFFERENTIAL/PLATELET
BASO%: 0.3 % (ref 0.0–2.0)
BASOS ABS: 0 10*3/uL (ref 0.0–0.1)
EOS ABS: 0.2 10*3/uL (ref 0.0–0.5)
EOS%: 3 % (ref 0.0–7.0)
HEMATOCRIT: 32 % — AB (ref 34.8–46.6)
HEMOGLOBIN: 10.5 g/dL — AB (ref 11.6–15.9)
LYMPH#: 1 10*3/uL (ref 0.9–3.3)
LYMPH%: 15.1 % (ref 14.0–49.7)
MCH: 34.4 pg — AB (ref 25.1–34.0)
MCHC: 32.8 g/dL (ref 31.5–36.0)
MCV: 104.9 fL — ABNORMAL HIGH (ref 79.5–101.0)
MONO#: 1 10*3/uL — ABNORMAL HIGH (ref 0.1–0.9)
MONO%: 14.1 % — AB (ref 0.0–14.0)
NEUT%: 67.5 % (ref 38.4–76.8)
NEUTROS ABS: 4.7 10*3/uL (ref 1.5–6.5)
Platelets: 137 10*3/uL — ABNORMAL LOW (ref 145–400)
RBC: 3.05 10*6/uL — ABNORMAL LOW (ref 3.70–5.45)
RDW: 13.1 % (ref 11.2–14.5)
WBC: 6.9 10*3/uL (ref 3.9–10.3)

## 2014-11-01 MED ORDER — DEXTROSE 5 % IV SOLN
37.0000 mg/m2 | Freq: Once | INTRAVENOUS | Status: AC
  Administered 2014-11-01: 70 mg via INTRAVENOUS
  Filled 2014-11-01: qty 35

## 2014-11-01 MED ORDER — SODIUM CHLORIDE 0.9 % IJ SOLN
10.0000 mL | INTRAMUSCULAR | Status: DC | PRN
  Administered 2014-11-01: 10 mL
  Filled 2014-11-01: qty 10

## 2014-11-01 MED ORDER — SODIUM CHLORIDE 0.9 % IV SOLN
300.0000 mg/m2 | Freq: Once | INTRAVENOUS | Status: AC
  Administered 2014-11-01: 560 mg via INTRAVENOUS
  Filled 2014-11-01: qty 28

## 2014-11-01 MED ORDER — SODIUM CHLORIDE 0.9 % IV SOLN
Freq: Once | INTRAVENOUS | Status: AC
  Administered 2014-11-01: 13:00:00 via INTRAVENOUS

## 2014-11-01 MED ORDER — SODIUM CHLORIDE 0.9 % IV SOLN
Freq: Once | INTRAVENOUS | Status: AC
  Administered 2014-11-01: 13:00:00 via INTRAVENOUS
  Filled 2014-11-01: qty 4

## 2014-11-01 MED ORDER — HEPARIN SOD (PORK) LOCK FLUSH 100 UNIT/ML IV SOLN
500.0000 [IU] | Freq: Once | INTRAVENOUS | Status: AC | PRN
  Administered 2014-11-01: 500 [IU]
  Filled 2014-11-01: qty 5

## 2014-11-01 MED ORDER — ZOLEDRONIC ACID 4 MG/5ML IV CONC
3.0000 mg | Freq: Once | INTRAVENOUS | Status: AC
  Administered 2014-11-01: 3 mg via INTRAVENOUS
  Filled 2014-11-01: qty 3.75

## 2014-11-01 NOTE — Progress Notes (Signed)
Patient complains of new onset shortness of breath x 1 hour. Patient complains of chest pressure x 1 hour. Patient denies nausea, vomiting, dizziness, patient declines radiation of chest pain to distant location. Patient states she used an albuterol inhaler approximately 30 minutes ago and has not noticed any relief. [name], NP notified.   Patient complains of generalized facial tingling and numbness x 5 minutes. [name], NP chairside.  Oxygen applied at 2 liters via nasal cannula. Patient reports the oxygen is making her feel better.   Patient states she feels better and relates her SOB to an asthma attack due to a visitors perfume in the lobby. [name], NP notified. Order given and carried out for an EKG. Patient's oxygen discontinued per [name], NP unless patient complains of further symptoms.

## 2014-11-01 NOTE — Patient Instructions (Signed)
[facility] Discharge Instructions for Patients Receiving Chemotherapy  Today you received the following chemotherapy agents cytoxan, kyprolis  To help prevent nausea and vomiting after your treatment, we encourage you to take your nausea medication   If you develop nausea and vomiting that is not controlled by your nausea medication, call the clinic.   BELOW ARE SYMPTOMS THAT SHOULD BE REPORTED IMMEDIATELY:  *FEVER GREATER THAN 100.5 F  *CHILLS WITH OR WITHOUT FEVER  NAUSEA AND VOMITING THAT IS NOT CONTROLLED WITH YOUR NAUSEA MEDICATION  *UNUSUAL SHORTNESS OF BREATH  *UNUSUAL BRUISING OR BLEEDING  TENDERNESS IN MOUTH AND THROAT WITH OR WITHOUT PRESENCE OF ULCERS  *URINARY PROBLEMS  *BOWEL PROBLEMS  UNUSUAL RASH Items with * indicate a potential emergency and should be followed up as soon as possible.  Feel free to call the clinic you have any questions or concerns. The clinic phone number is [phone].  Please show the [place] at check-in to the Emergency Department and triage nurse.

## 2014-11-01 NOTE — Progress Notes (Signed)
Per [name] NP, Dr. Julien Nordmann aware of pt's Creatinine level, per MD okay to treat.

## 2014-11-02 ENCOUNTER — Ambulatory Visit (HOSPITAL_BASED_OUTPATIENT_CLINIC_OR_DEPARTMENT_OTHER): Payer: Medicare Other

## 2014-11-02 ENCOUNTER — Encounter: Payer: Self-pay | Admitting: Nurse Practitioner

## 2014-11-02 DIAGNOSIS — Z5112 Encounter for antineoplastic immunotherapy: Secondary | ICD-10-CM | POA: Diagnosis not present

## 2014-11-02 DIAGNOSIS — C9000 Multiple myeloma not having achieved remission: Secondary | ICD-10-CM

## 2014-11-02 MED ORDER — DEXTROSE 5 % IV SOLN
37.0000 mg/m2 | Freq: Once | INTRAVENOUS | Status: AC
  Administered 2014-11-02: 70 mg via INTRAVENOUS
  Filled 2014-11-02: qty 35

## 2014-11-02 MED ORDER — HEPARIN SOD (PORK) LOCK FLUSH 100 UNIT/ML IV SOLN
500.0000 [IU] | Freq: Once | INTRAVENOUS | Status: AC | PRN
  Administered 2014-11-02: 500 [IU]
  Filled 2014-11-02: qty 5

## 2014-11-02 MED ORDER — DEXAMETHASONE SODIUM PHOSPHATE 100 MG/10ML IJ SOLN
Freq: Once | INTRAMUSCULAR | Status: AC
  Administered 2014-11-02: 13:00:00 via INTRAVENOUS
  Filled 2014-11-02: qty 4

## 2014-11-02 MED ORDER — SODIUM CHLORIDE 0.9 % IV SOLN
Freq: Once | INTRAVENOUS | Status: AC
  Administered 2014-11-02: 12:00:00 via INTRAVENOUS

## 2014-11-02 MED ORDER — SODIUM CHLORIDE 0.9 % IJ SOLN
10.0000 mL | INTRAMUSCULAR | Status: DC | PRN
  Administered 2014-11-02: 10 mL
  Filled 2014-11-02: qty 10

## 2014-11-02 NOTE — Assessment & Plan Note (Signed)
Patient states that she encountered someone while at the [place] wearing some heavy perfume; and she developed some chest discomfort, mild shortness of breath, and some overall facial numbness/tingling.  She denied any pain with inspiration.  She also denied any radiation of the chest wall pain whatsoever.  Patient had not begun her chemotherapy infusion when the symptoms initiated.  On exam.-Lungs clear bilaterally with no wheeze.  Also, no cough noted.  EKG obtained revealed a normal sinus rhythm with a rate of 61 and a QTC of 438.  After monitoring patient for approximately 15 minutes-all symptoms completely resolved and patient was able to complete her chemotherapy as scheduled.  Patient states that she is fairly sensitive to any type of perfume whatsoever; and this is happened to her in the past.  Patient was advised to go directly to the emergency department if she develops any returning symptoms whatsoever.

## 2014-11-02 NOTE — Progress Notes (Signed)
SYMPTOM MANAGEMENT CLINIC   HPI: Nancy Norris 74 y.o. female diagnosed with multiple myeloma.  Currently undergoing Cytoxan/Kyprolis/Zometa chemotherapy regimen.  Patient states that Nancy Norris encountered someone while at the [place] wearing some heavy perfume; and Nancy Norris developed some chest discomfort, mild shortness of breath, and some overall facial numbness/tingling.  Nancy Norris denied any pain with inspiration.  Nancy Norris also denied any radiation of the chest wall pain whatsoever.  Patient had not begun her chemotherapy infusion when the symptoms initiated.  HPI  ROS  Past Medical History  Diagnosis Date  . Hyperkalemia   . Hypothyroidism   . COPD (chronic obstructive pulmonary disease) ([place])   . Hyponatremia   . Dizziness   . Fibromyalgia   . Breast cancer ([place])   . Multiple myeloma   . Mucositis   . Hx of echocardiogram     a.  Echocardiogram (12/26/2012): EF 17-98%, grade 1 diastolic dysfunction;   b.  Echocardiogram (02/2013): EF 55-60%, no WMA, trivial effusion  . Fibromyalgia   . Arthritis   . Hx of cardiovascular stress test     LexiScan with low level exercise Myoview (02/2013): No ischemia, EF 72%; normal study  . Complication of anesthesia   . PONV (postoperative nausea and vomiting) 2008    after mastestomy  . Myocardial infarction [facility])     in past, patient was unaware.   . Anginal pain ([place])     used NTG x 2 May 31 and 06/15/13   . GERD (gastroesophageal reflux disease)   . Headache(784.0)   . Anxiety   . Depression   . CKD (chronic kidney disease) stage 3, GFR 30-59 ml/min   . Anemia   . History of blood transfusion     last one May 12   . Neuropathy [facility])     Past Surgical History  Procedure Laterality Date  . History of port removal    . Status post stem cell transplant on September 28, 2008.    Marland Kitchen Abdominal hysterectomy  1981  . Cholecystectomy  1971  . Mastectomy Left 2008  . Cataract extraction, bilateral    . Breast surgery    . Eye surgery  Bilateral     lens implant  . Breast reconstruction    . Portacath placement  12/2012    has had 2  . Av fistula placement Left 06/19/2013    Procedure: CREATION OF LEFT ARM ARTERIOVENOUS (AV) FISTULA ;  Surgeon: Angelia Mould, MD;  Location: [place];  Service: Vascular;  Laterality: Left;    has Hypercalcemia; HX: breast cancer; Asthma, chronic; Hypothyroid; Chronic diastolic heart failure (HCC); HTN (hypertension); GERD (gastroesophageal reflux disease); COPD (chronic obstructive pulmonary disease) with emphysema ([place]); Fibromyalgia; Mixed hyperlipidemia; Prediabetes; Vitamin D deficiency; Medication management; Depression, controlled; Multiple myeloma ([place]); Fatigue; Orthostatic hypotension; CKD (chronic kidney disease) stage 5, GFR less than 15 ml/min (HCC); Anemia; Nausea with vomiting; Renal insufficiency; Dehydration; Hypoalbuminemia; Encounter for antineoplastic chemotherapy; CHF (congestive heart failure) ([place]); and Hypersensitivity reaction on her problem list.    is allergic to codeine; latex; other; onion; zyprexa; adhesive; iodinated diagnostic agents; and sulfa antibiotics.    Medication List       This list is accurate as of: 11/01/14 11:59 PM.  Always use your most recent med list.               acetaminophen 325 MG tablet  Commonly known as:  TYLENOL  Take 650 mg by mouth every 6 (six) hours as needed  for mild pain or moderate pain.     albuterol 108 (90 BASE) MCG/ACT inhaler  Commonly known as:  PROVENTIL HFA;VENTOLIN HFA  Inhale 2 puffs into the lungs 2 (two) times daily as needed for wheezing or shortness of breath.     ascorbic acid 250 MG Chew  Commonly known as:  VITAMIN C  Chew 250 mg by mouth daily.     aspirin EC 81 MG tablet  Take 1 tablet (81 mg total) by mouth daily.     buPROPion 300 MG 24 hr tablet  Commonly known as:  WELLBUTRIN XL  Take 300 mg by mouth daily.     carboxymethylcellulose 0.5 % Soln  Commonly known as:  REFRESH PLUS  Place  1 drop into both eyes every 2 (two) hours.     Cholecalciferol 4000 UNITS Tabs  Take 4,000 Units by mouth daily.     citalopram 40 MG tablet  Commonly known as:  CELEXA  TAKE 1 TABLET BY MOUTH ONCE DAILY.     cromolyn 4 % ophthalmic solution  Commonly known as:  OPTICROM  Place 1 drop into both eyes 4 (four) times daily.     dexamethasone 4 MG tablet  Commonly known as:  DECADRON  Take 10 tablets by mouth once a week as directed     esomeprazole 40 MG capsule  Commonly known as:  NEXIUM  TAKE 1 CAPSULE BY MOUTH DAILY, EACH MORNING FOR ACID INDIGESTION     Ferrous Sulfate 134 MG Tabs  Take 1 tablet (134 mg total) by mouth every other day.     fluticasone 50 MCG/ACT nasal spray  Commonly known as:  FLONASE  Place 2 sprays into both nostrils at bedtime.     Fluticasone-Salmeterol 100-50 MCG/DOSE Aepb  Commonly known as:  ADVAIR  Inhale 1 puff into the lungs 2 (two) times daily.     gabapentin 300 MG capsule  Commonly known as:  NEURONTIN  TAKE 1 CAPSULE BY MOUTH AT BEDTIME.     isosorbide mononitrate 30 MG 24 hr tablet  Commonly known as:  IMDUR  TAKE 1 TABLET BY MOUTH DAILY.     levothyroxine 150 MCG tablet  Commonly known as:  SYNTHROID, LEVOTHROID  TAKE 1 TABLET BY MOUTH ONCE DAILY.     loperamide 2 MG capsule  Commonly known as:  IMODIUM  Take two tabs po initially, then one tab after each loose stool: max 8 tabs in 24 hours     loratadine 10 MG tablet  Commonly known as:  CLARITIN  Take 10 mg by mouth daily.     LORazepam 0.5 MG tablet  Commonly known as:  ATIVAN  Take 1 tablet (0.5 mg total) by mouth every 8 (eight) hours as needed (nausea).     loteprednol 0.2 % Susp  Commonly known as:  ALREX  Place 1 drop into both eyes 4 (four) times daily.     meclizine 32 MG tablet  Commonly known as:  ANTIVERT  Take 1 tablet (32 mg total) by mouth 3 (three) times daily as needed.     midodrine 10 MG tablet  Commonly known as:  PROAMATINE  Take 1 tablet (10  mg total) by mouth 3 (three) times daily.     nitroGLYCERIN 0.4 MG SL tablet  Commonly known as:  NITROSTAT  Place 1 tablet (0.4 mg total) under the tongue every 5 (five) minutes as needed for chest pain.     nystatin 100000 UNIT/GM Powd  Apply 1  g topically 2 (two) times daily. Apply to belly     ondansetron 8 MG disintegrating tablet  Commonly known as:  ZOFRAN-ODT  Take 1 tablet (8 mg total) by mouth every 8 (eight) hours as needed for nausea or vomiting.     pilocarpine 5 MG tablet  Commonly known as:  SALAGEN  TAKE 1 TABLET BY MOUTH 3 TIMES DAILY.     polyethylene glycol packet  Commonly known as:  MIRALAX / GLYCOLAX  Take 17 g by mouth daily.     prochlorperazine 10 MG tablet  Commonly known as:  COMPAZINE  TAKE 1 TABLET BY MOUTH EVERY 6 HOURS AS NEEDED FOR NAUSEA OR VOMITING.     ranitidine 300 MG tablet  Commonly known as:  ZANTAC  TAKE 1 TABLET BY MOUTH AT BEDTIME, FOR ACID REFLUX AND INDIGESTION.     RESTASIS 0.05 % ophthalmic emulsion  Generic drug:  cycloSPORINE  Place 1 drop into both eyes 2 (two) times daily.         PHYSICAL EXAMINATION  Oncology Vitals 11/01/2014 11/01/2014 11/01/2014 10/21/2014 08/23/2014 08/13/2014 08/03/2014  Height - - - 165 cm 165 cm 165 cm -  Weight - - - 70.761 kg 72.303 kg 71.215 kg -  Weight (lbs) - - - 156 lbs 159 lbs 6 oz 157 lbs -  BMI (kg/m2) - - - 25.96 kg/m2 26.53 kg/m2 26.13 kg/m2 -  Temp - - 98 98.4 98.5 97.8 98.9  Pulse 59 63 63 76 82 54 72  Resp - - $R'20 18 18 16 18  'Zx$ Resp (Historical as of 08/16/11) - - - - - - -  SpO2 100 100 100 100 99 - -  BSA (m2) - - - 1.8 m2 1.82 m2 1.81 m2 -   BP Readings from Last 3 Encounters:  11/01/14 112/44  10/21/14 120/58  08/23/14 119/52    Physical Exam  Constitutional: Nancy Norris is oriented to person, place, and time and well-developed, well-nourished, and in no distress.  HENT:  Head: Normocephalic and atraumatic.  Eyes: Conjunctivae and EOM are normal. Pupils are equal, round, and  reactive to light. Right eye exhibits no discharge. Left eye exhibits no discharge. No scleral icterus.  Neck: Normal range of motion. Neck supple. No JVD present. No tracheal deviation present. No thyromegaly present.  Cardiovascular: Normal rate, regular rhythm, normal heart sounds and intact distal pulses.   Pulmonary/Chest: Effort normal and breath sounds normal. No stridor. No respiratory distress. Nancy Norris has no wheezes. Nancy Norris has no rales. Nancy Norris exhibits no tenderness.  Abdominal: Soft. Bowel sounds are normal. Nancy Norris exhibits no distension and no mass. There is no tenderness. There is no rebound and no guarding.  Musculoskeletal: Normal range of motion. Nancy Norris exhibits no edema or tenderness.  Lymphadenopathy:    Nancy Norris has no cervical adenopathy.  Neurological: Nancy Norris is alert and oriented to person, place, and time. Gait normal.  Skin: Skin is warm and dry. No rash noted. No erythema. No pallor.  Psychiatric:  Slightly anxious.  Nursing note and vitals reviewed.   LABORATORY DATA:. Appointment on 11/01/2014  Component Date Value Ref Range Status  . WBC 11/01/2014 6.9  3.9 - 10.3 10e3/uL Final  . NEUT# 11/01/2014 4.7  1.5 - 6.5 10e3/uL Final  . HGB 11/01/2014 10.5* 11.6 - 15.9 g/dL Final  . HCT 11/01/2014 32.0* 34.8 - 46.6 % Final  . Platelets 11/01/2014 137* 145 - 400 10e3/uL Final  . MCV 11/01/2014 104.9* 79.5 - 101.0 fL Final  . MCH  11/01/2014 34.4* 25.1 - 34.0 pg Final  . MCHC 11/01/2014 32.8  31.5 - 36.0 g/dL Final  . RBC 11/01/2014 3.05* 3.70 - 5.45 10e6/uL Final  . RDW 11/01/2014 13.1  11.2 - 14.5 % Final  . lymph# 11/01/2014 1.0  0.9 - 3.3 10e3/uL Final  . MONO# 11/01/2014 1.0* 0.1 - 0.9 10e3/uL Final  . Eosinophils Absolute 11/01/2014 0.2  0.0 - 0.5 10e3/uL Final  . Basophils Absolute 11/01/2014 0.0  0.0 - 0.1 10e3/uL Final  . NEUT% 11/01/2014 67.5  38.4 - 76.8 % Final  . LYMPH% 11/01/2014 15.1  14.0 - 49.7 % Final  . MONO% 11/01/2014 14.1* 0.0 - 14.0 % Final  . EOS% 11/01/2014 3.0   0.0 - 7.0 % Final  . BASO% 11/01/2014 0.3  0.0 - 2.0 % Final  . Sodium 11/01/2014 141  136 - 145 mEq/L Final  . Potassium 11/01/2014 4.2  3.5 - 5.1 mEq/L Final  . Chloride 11/01/2014 114* 98 - 109 mEq/L Final  . CO2 11/01/2014 20* 22 - 29 mEq/L Final  . Glucose 11/01/2014 102  70 - 140 mg/dl Final   Glucose reference range is for nonfasting patients. Fasting glucose reference range is 70- 100.  Marland Kitchen BUN 11/01/2014 31.3* 7.0 - 26.0 mg/dL Final  . Creatinine 11/01/2014 3.3* 0.6 - 1.1 mg/dL Final  . Total Bilirubin 11/01/2014 0.52  0.20 - 1.20 mg/dL Final  . Alkaline Phosphatase 11/01/2014 82  40 - 150 U/L Final  . AST 11/01/2014 17  5 - 34 U/L Final  . ALT 11/01/2014 17  0 - 55 U/L Final  . Total Protein 11/01/2014 6.1* 6.4 - 8.3 g/dL Final  . Albumin 11/01/2014 3.9  3.5 - 5.0 g/dL Final  . Calcium 11/01/2014 8.9  8.4 - 10.4 mg/dL Final  . Anion Gap 11/01/2014 8  3 - 11 mEq/L Final  . EGFR 11/01/2014 13* >90 ml/min/1.73 m2 Final   eGFR is calculated using the CKD-EPI Creatinine Equation (2009)   EKG: AZELEA, SEGUIN HA:[id number] 01-Nov-2014 12:31:23 [facility]-ONC ROUTINE RECORD Normal sinus rhythm Normal ECG No significant change since last tracing Confirmed by SKAINS MD, MARK ([id number]) on 11/01/2014 1:17:33 PM 70mm/s 49mm/mV $RemoveBefore'100Hz'[id number]$  8.0 SP2 12SL 237 CID: [id number] Referred by: Confirmed By: Candee Furbish MD Vent. rate 61 BPM PR interval 186 ms QRS duration 78 ms QT/QTc 436/438 ms P-R-T axes 78 64 79 1940-08-20 (74 yr) Female Caucasian Room: Loc:511 Technician: KG Test ind: Perry: COMMENTS  RADIOGRAPHIC STUDIES: No results found.  ASSESSMENT/PLAN:    Multiple myeloma Patient presented to the [facility] today to receive cycle 5, day 1 of her Cytoxan/Kyprolis chemotherapy.  Nancy Norris also will receive Zometa infusion today as well.  Patient states that Nancy Norris encountered someone while at the [place] wearing some heavy perfume; and Nancy Norris developed some chest discomfort,  mild shortness of breath, and some overall facial numbness/tingling.  Nancy Norris denied any pain with inspiration.  Nancy Norris also denied any radiation of the chest wall pain whatsoever.  Please see further notes regarding mild allergic reaction.  Patient is scheduled to return tomorrow 11/02/2014 for cycle 5, day 2 of her chemotherapy regimen.  Nancy Norris will return on 11/08/2014 for labs and chemotherapy as well.  Hypersensitivity reaction Patient states that Nancy Norris encountered someone while at the [place] wearing some heavy perfume; and Nancy Norris developed some chest discomfort, mild shortness of breath, and some overall facial numbness/tingling.  Nancy Norris denied any pain with inspiration.  Nancy Norris also denied any radiation of the  chest wall pain whatsoever.  Patient had not begun her chemotherapy infusion when the symptoms initiated.  On exam.-Lungs clear bilaterally with no wheeze.  Also, no cough noted.  EKG obtained revealed a normal sinus rhythm with a rate of 61 and a QTC of 438.  After monitoring patient for approximately 15 minutes-all symptoms completely resolved and patient was able to complete her chemotherapy as scheduled.  Patient states that Nancy Norris is fairly sensitive to any type of perfume whatsoever; and this is happened to her in the past.  Patient was advised to go directly to the emergency department if Nancy Norris develops any returning symptoms whatsoever.   Patient stated understanding of all instructions; and was in agreement with this plan of care. The patient knows to call the clinic with any problems, questions or concerns.   Review/collaboration with Dr. Julien Nordmann regarding all aspects of patient's visit today.   Total time spent with patient was 25 minutes;  with greater than 75 percent of that time spent in face to face counseling regarding patient's symptoms,  and coordination of care and follow up.  Disclaimer:This dictation was prepared with Dragon/digital dictation along with [employer].  Any transcriptional errors that result from this process are unintentional.  Drue Second, NP 11/02/2014

## 2014-11-02 NOTE — Patient Instructions (Signed)
[facility] Discharge Instructions for Patients Receiving Chemotherapy  Today you received the following chemotherapy agents Kyprolis  To help prevent nausea and vomiting after your treatment, we encourage you to take your nausea medication    If you develop nausea and vomiting that is not controlled by your nausea medication, call the clinic.   BELOW ARE SYMPTOMS THAT SHOULD BE REPORTED IMMEDIATELY:  *FEVER GREATER THAN 100.5 F  *CHILLS WITH OR WITHOUT FEVER  NAUSEA AND VOMITING THAT IS NOT CONTROLLED WITH YOUR NAUSEA MEDICATION  *UNUSUAL SHORTNESS OF BREATH  *UNUSUAL BRUISING OR BLEEDING  TENDERNESS IN MOUTH AND THROAT WITH OR WITHOUT PRESENCE OF ULCERS  *URINARY PROBLEMS  *BOWEL PROBLEMS  UNUSUAL RASH Items with * indicate a potential emergency and should be followed up as soon as possible.  Feel free to call the clinic you have any questions or concerns. The clinic phone number is [phone].  Please show the CHEMO ALERT CARD at check-in to the Emergency Department and triage nurse.   

## 2014-11-02 NOTE — Progress Notes (Signed)
OK to treat  With current labs per Dr. Mckinley Jewel Abelina Bachelor RN.

## 2014-11-02 NOTE — Assessment & Plan Note (Signed)
Patient presented to the [place] today to receive cycle 5, day 1 of her Cytoxan/Kyprolis chemotherapy.  She also will receive Zometa infusion today as well.  Patient states that she encountered someone while at the [place] wearing some heavy perfume; and she developed some chest discomfort, mild shortness of breath, and some overall facial numbness/tingling.  She denied any pain with inspiration.  She also denied any radiation of the chest wall pain whatsoever.  Please see further notes regarding mild allergic reaction.  Patient is scheduled to return tomorrow 11/02/2014 for cycle 5, day 2 of her chemotherapy regimen.  She will return on 11/08/2014 for labs and chemotherapy as well.

## 2014-11-02 NOTE — Progress Notes (Signed)
Per Nancy Norris it is okay to treat pt today with chemo and today's labs.

## 2014-11-03 ENCOUNTER — Encounter: Payer: Self-pay | Admitting: *Deleted

## 2014-11-03 ENCOUNTER — Telehealth: Payer: Self-pay | Admitting: *Deleted

## 2014-11-03 NOTE — Telephone Encounter (Signed)
Spoke to pt- she reports she is feeling fine. She acknowledges to call with questions or concerns.

## 2014-11-03 NOTE — Progress Notes (Signed)
[place] Work  Clinical Social Work met with patient in infusion room to complete SCAT application.  Patient completed patient portion.  CSW will complete healthcare portion and fax in on behalf of patient.  CSW also provided patient with information on [facility] to Recovery program.  Polo Riley, MSW, LCSW, OSW-C Clinical Social Worker [facility] [phone]

## 2014-11-08 ENCOUNTER — Ambulatory Visit (HOSPITAL_BASED_OUTPATIENT_CLINIC_OR_DEPARTMENT_OTHER): Payer: Medicare Other

## 2014-11-08 ENCOUNTER — Other Ambulatory Visit (HOSPITAL_BASED_OUTPATIENT_CLINIC_OR_DEPARTMENT_OTHER): Payer: Medicare Other

## 2014-11-08 VITALS — BP 120/50 | HR 66 | Temp 98.3°F | Resp 18

## 2014-11-08 DIAGNOSIS — Z5111 Encounter for antineoplastic chemotherapy: Secondary | ICD-10-CM | POA: Diagnosis not present

## 2014-11-08 DIAGNOSIS — C9000 Multiple myeloma not having achieved remission: Secondary | ICD-10-CM | POA: Diagnosis not present

## 2014-11-08 DIAGNOSIS — Z5112 Encounter for antineoplastic immunotherapy: Secondary | ICD-10-CM | POA: Diagnosis not present

## 2014-11-08 LAB — CBC WITH DIFFERENTIAL/PLATELET
BASO%: 0.3 % (ref 0.0–2.0)
Basophils Absolute: 0 10*3/uL (ref 0.0–0.1)
EOS%: 11.2 % — ABNORMAL HIGH (ref 0.0–7.0)
Eosinophils Absolute: 0.6 10*3/uL — ABNORMAL HIGH (ref 0.0–0.5)
HCT: 28.9 % — ABNORMAL LOW (ref 34.8–46.6)
HGB: 9.5 g/dL — ABNORMAL LOW (ref 11.6–15.9)
LYMPH%: 10.8 % — AB (ref 14.0–49.7)
MCH: 33.9 pg (ref 25.1–34.0)
MCHC: 32.7 g/dL (ref 31.5–36.0)
MCV: 103.8 fL — ABNORMAL HIGH (ref 79.5–101.0)
MONO#: 0.7 10*3/uL (ref 0.1–0.9)
MONO%: 12.3 % (ref 0.0–14.0)
NEUT%: 65.4 % (ref 38.4–76.8)
NEUTROS ABS: 3.7 10*3/uL (ref 1.5–6.5)
PLATELETS: 86 10*3/uL — AB (ref 145–400)
RBC: 2.79 10*6/uL — AB (ref 3.70–5.45)
RDW: 12.9 % (ref 11.2–14.5)
WBC: 5.6 10*3/uL (ref 3.9–10.3)
lymph#: 0.6 10*3/uL — ABNORMAL LOW (ref 0.9–3.3)

## 2014-11-08 LAB — COMPREHENSIVE METABOLIC PANEL (CC13)
ALBUMIN: 3.4 g/dL — AB (ref 3.5–5.0)
ALT: 17 U/L (ref 0–55)
AST: 10 U/L (ref 5–34)
Alkaline Phosphatase: 73 U/L (ref 40–150)
Anion Gap: 7 mEq/L (ref 3–11)
BUN: 35.9 mg/dL — AB (ref 7.0–26.0)
CALCIUM: 8.7 mg/dL (ref 8.4–10.4)
CHLORIDE: 118 meq/L — AB (ref 98–109)
CO2: 19 mEq/L — ABNORMAL LOW (ref 22–29)
CREATININE: 4.1 mg/dL — AB (ref 0.6–1.1)
EGFR: 10 mL/min/{1.73_m2} — ABNORMAL LOW (ref >90)
Glucose: 113 mg/dl (ref 70–140)
Potassium: 4 mEq/L (ref 3.5–5.1)
Sodium: 145 mEq/L (ref 136–145)
Total Bilirubin: 0.49 mg/dL (ref 0.20–1.20)
Total Protein: 5.7 g/dL — ABNORMAL LOW (ref 6.4–8.3)

## 2014-11-08 MED ORDER — SODIUM CHLORIDE 0.9 % IV SOLN
Freq: Once | INTRAVENOUS | Status: AC
  Administered 2014-11-08: 15:00:00 via INTRAVENOUS

## 2014-11-08 MED ORDER — SODIUM CHLORIDE 0.9 % IV SOLN
Freq: Once | INTRAVENOUS | Status: AC
  Administered 2014-11-08: 13:00:00 via INTRAVENOUS

## 2014-11-08 MED ORDER — SODIUM CHLORIDE 0.9 % IV SOLN
Freq: Once | INTRAVENOUS | Status: AC
  Administered 2014-11-08: 13:00:00 via INTRAVENOUS
  Filled 2014-11-08: qty 4

## 2014-11-08 MED ORDER — CYCLOPHOSPHAMIDE CHEMO INJECTION 1 GM
300.0000 mg/m2 | Freq: Once | INTRAMUSCULAR | Status: AC
  Administered 2014-11-08: 560 mg via INTRAVENOUS
  Filled 2014-11-08: qty 28

## 2014-11-08 MED ORDER — SODIUM CHLORIDE 0.9 % IJ SOLN
10.0000 mL | INTRAMUSCULAR | Status: DC | PRN
  Administered 2014-11-08: 10 mL
  Filled 2014-11-08: qty 10

## 2014-11-08 MED ORDER — DEXTROSE 5 % IV SOLN
37.0000 mg/m2 | Freq: Once | INTRAVENOUS | Status: AC
  Administered 2014-11-08: 70 mg via INTRAVENOUS
  Filled 2014-11-08: qty 35

## 2014-11-08 MED ORDER — HEPARIN SOD (PORK) LOCK FLUSH 100 UNIT/ML IV SOLN
500.0000 [IU] | Freq: Once | INTRAVENOUS | Status: AC | PRN
  Administered 2014-11-08: 500 [IU]
  Filled 2014-11-08: qty 5

## 2014-11-08 NOTE — Progress Notes (Signed)
Pt labs came back with platelet of 86 and Cr 4.4. Pt also reports experiencing on/off sore throat, flu like symptoms, decreased appetite, moderate fatigue, and intermittent nosebleeds. Pt VSS stable. Dr. Julien Nordmann reviewed labs and current symptoms and OK to treat pt today. Pt also states that she still takes her steroid at home.

## 2014-11-08 NOTE — Patient Instructions (Signed)
[facility] Discharge Instructions for Patients Receiving Chemotherapy  Today you received the following chemotherapy agents cytoxan, kyprolis  To help prevent nausea and vomiting after your treatment, we encourage you to take your nausea medication   If you develop nausea and vomiting that is not controlled by your nausea medication, call the clinic.   BELOW ARE SYMPTOMS THAT SHOULD BE REPORTED IMMEDIATELY:  *FEVER GREATER THAN 100.5 F  *CHILLS WITH OR WITHOUT FEVER  NAUSEA AND VOMITING THAT IS NOT CONTROLLED WITH YOUR NAUSEA MEDICATION  *UNUSUAL SHORTNESS OF BREATH  *UNUSUAL BRUISING OR BLEEDING  TENDERNESS IN MOUTH AND THROAT WITH OR WITHOUT PRESENCE OF ULCERS  *URINARY PROBLEMS  *BOWEL PROBLEMS  UNUSUAL RASH Items with * indicate a potential emergency and should be followed up as soon as possible.  Feel free to call the clinic you have any questions or concerns. The clinic phone number is [phone].  Please show the Nancy Norris at check-in to the Emergency Department and triage nurse.

## 2014-11-08 NOTE — Progress Notes (Signed)
OK to treat with today's labs per Dr [name] 

## 2014-11-09 ENCOUNTER — Other Ambulatory Visit: Payer: Self-pay | Admitting: Nurse Practitioner

## 2014-11-09 ENCOUNTER — Ambulatory Visit (HOSPITAL_BASED_OUTPATIENT_CLINIC_OR_DEPARTMENT_OTHER): Payer: Medicare Other

## 2014-11-09 ENCOUNTER — Other Ambulatory Visit: Payer: Self-pay | Admitting: Internal Medicine

## 2014-11-09 VITALS — BP 119/49 | HR 69 | Temp 97.8°F | Resp 18

## 2014-11-09 DIAGNOSIS — Z5112 Encounter for antineoplastic immunotherapy: Secondary | ICD-10-CM

## 2014-11-09 DIAGNOSIS — C9000 Multiple myeloma not having achieved remission: Secondary | ICD-10-CM

## 2014-11-09 MED ORDER — SODIUM CHLORIDE 0.9 % IJ SOLN
10.0000 mL | INTRAMUSCULAR | Status: DC | PRN
  Administered 2014-11-09: 10 mL
  Filled 2014-11-09: qty 10

## 2014-11-09 MED ORDER — HEPARIN SOD (PORK) LOCK FLUSH 100 UNIT/ML IV SOLN
500.0000 [IU] | Freq: Once | INTRAVENOUS | Status: AC | PRN
  Administered 2014-11-09: 500 [IU]
  Filled 2014-11-09: qty 5

## 2014-11-09 MED ORDER — SODIUM CHLORIDE 0.9 % IV SOLN
Freq: Once | INTRAVENOUS | Status: AC
  Administered 2014-11-09: 12:00:00 via INTRAVENOUS

## 2014-11-09 MED ORDER — DEXTROSE 5 % IV SOLN
37.0000 mg/m2 | Freq: Once | INTRAVENOUS | Status: AC
  Administered 2014-11-09: 70 mg via INTRAVENOUS
  Filled 2014-11-09: qty 35

## 2014-11-09 MED ORDER — SODIUM CHLORIDE 0.9 % IV SOLN
Freq: Once | INTRAVENOUS | Status: AC
  Administered 2014-11-09: 12:00:00 via INTRAVENOUS
  Filled 2014-11-09: qty 4

## 2014-11-09 NOTE — Patient Instructions (Signed)
[facility] Discharge Instructions for Patients Receiving Chemotherapy  Today you received the following chemotherapy agents Kyprolis  To help prevent nausea and vomiting after your treatment, we encourage you to take your nausea medication    If you develop nausea and vomiting that is not controlled by your nausea medication, call the clinic.   BELOW ARE SYMPTOMS THAT SHOULD BE REPORTED IMMEDIATELY:  *FEVER GREATER THAN 100.5 F  *CHILLS WITH OR WITHOUT FEVER  NAUSEA AND VOMITING THAT IS NOT CONTROLLED WITH YOUR NAUSEA MEDICATION  *UNUSUAL SHORTNESS OF BREATH  *UNUSUAL BRUISING OR BLEEDING  TENDERNESS IN MOUTH AND THROAT WITH OR WITHOUT PRESENCE OF ULCERS  *URINARY PROBLEMS  *BOWEL PROBLEMS  UNUSUAL RASH Items with * indicate a potential emergency and should be followed up as soon as possible.  Feel free to call the clinic you have any questions or concerns. The clinic phone number is [phone].  Please show the CHEMO ALERT CARD at check-in to the Emergency Department and triage nurse.   

## 2014-11-11 ENCOUNTER — Other Ambulatory Visit: Payer: Self-pay | Admitting: *Deleted

## 2014-11-11 ENCOUNTER — Other Ambulatory Visit: Payer: Self-pay

## 2014-11-11 MED ORDER — FUROSEMIDE 40 MG PO TABS: 40.0000 mg | ORAL_TABLET | Freq: Every day | ORAL | Status: DC

## 2014-11-15 ENCOUNTER — Ambulatory Visit (HOSPITAL_BASED_OUTPATIENT_CLINIC_OR_DEPARTMENT_OTHER): Payer: Medicare Other

## 2014-11-15 ENCOUNTER — Other Ambulatory Visit (HOSPITAL_BASED_OUTPATIENT_CLINIC_OR_DEPARTMENT_OTHER): Payer: Medicare Other

## 2014-11-15 ENCOUNTER — Telehealth: Payer: Self-pay | Admitting: *Deleted

## 2014-11-15 ENCOUNTER — Ambulatory Visit (HOSPITAL_BASED_OUTPATIENT_CLINIC_OR_DEPARTMENT_OTHER): Payer: Medicare Other | Admitting: Physician Assistant

## 2014-11-15 ENCOUNTER — Telehealth: Payer: Self-pay | Admitting: Internal Medicine

## 2014-11-15 ENCOUNTER — Ambulatory Visit: Payer: Medicare Other | Admitting: Internal Medicine

## 2014-11-15 VITALS — BP 110/50 | HR 67 | Temp 98.3°F | Resp 20 | Ht 66.0 in | Wt 157.3 lb

## 2014-11-15 DIAGNOSIS — D630 Anemia in neoplastic disease: Secondary | ICD-10-CM | POA: Diagnosis not present

## 2014-11-15 DIAGNOSIS — Z23 Encounter for immunization: Secondary | ICD-10-CM | POA: Diagnosis not present

## 2014-11-15 DIAGNOSIS — C9002 Multiple myeloma in relapse: Secondary | ICD-10-CM

## 2014-11-15 DIAGNOSIS — Z5111 Encounter for antineoplastic chemotherapy: Secondary | ICD-10-CM

## 2014-11-15 DIAGNOSIS — C9000 Multiple myeloma not having achieved remission: Secondary | ICD-10-CM

## 2014-11-15 DIAGNOSIS — R109 Unspecified abdominal pain: Secondary | ICD-10-CM | POA: Diagnosis not present

## 2014-11-15 DIAGNOSIS — Z5112 Encounter for antineoplastic immunotherapy: Secondary | ICD-10-CM | POA: Diagnosis not present

## 2014-11-15 DIAGNOSIS — D638 Anemia in other chronic diseases classified elsewhere: Secondary | ICD-10-CM | POA: Diagnosis not present

## 2014-11-15 DIAGNOSIS — Z853 Personal history of malignant neoplasm of breast: Secondary | ICD-10-CM

## 2014-11-15 DIAGNOSIS — D631 Anemia in chronic kidney disease: Secondary | ICD-10-CM

## 2014-11-15 DIAGNOSIS — N189 Chronic kidney disease, unspecified: Secondary | ICD-10-CM

## 2014-11-15 LAB — CBC WITH DIFFERENTIAL/PLATELET
BASO%: 0.5 % (ref 0.0–2.0)
Basophils Absolute: 0 10*3/uL (ref 0.0–0.1)
EOS ABS: 0.5 10*3/uL (ref 0.0–0.5)
EOS%: 12.3 % — ABNORMAL HIGH (ref 0.0–7.0)
HCT: 27.2 % — ABNORMAL LOW (ref 34.8–46.6)
HEMOGLOBIN: 8.9 g/dL — AB (ref 11.6–15.9)
LYMPH%: 11.3 % — ABNORMAL LOW (ref 14.0–49.7)
MCH: 34 pg (ref 25.1–34.0)
MCHC: 32.6 g/dL (ref 31.5–36.0)
MCV: 104.1 fL — AB (ref 79.5–101.0)
MONO#: 0.6 10*3/uL (ref 0.1–0.9)
MONO%: 16.4 % — AB (ref 0.0–14.0)
NEUT%: 59.5 % (ref 38.4–76.8)
NEUTROS ABS: 2.4 10*3/uL (ref 1.5–6.5)
Platelets: 84 10*3/uL — ABNORMAL LOW (ref 145–400)
RBC: 2.62 10*6/uL — ABNORMAL LOW (ref 3.70–5.45)
RDW: 12.8 % (ref 11.2–14.5)
WBC: 4 10*3/uL (ref 3.9–10.3)
lymph#: 0.4 10*3/uL — ABNORMAL LOW (ref 0.9–3.3)

## 2014-11-15 LAB — COMPREHENSIVE METABOLIC PANEL (CC13)
ALBUMIN: 3.6 g/dL (ref 3.5–5.0)
ALK PHOS: 94 U/L (ref 40–150)
ALT: 23 U/L (ref 0–55)
AST: 13 U/L (ref 5–34)
Anion Gap: 6 mEq/L (ref 3–11)
BILIRUBIN TOTAL: 0.68 mg/dL (ref 0.20–1.20)
BUN: 32 mg/dL — AB (ref 7.0–26.0)
CO2: 21 mEq/L — ABNORMAL LOW (ref 22–29)
CREATININE: 3.9 mg/dL — AB (ref 0.6–1.1)
Calcium: 9.5 mg/dL (ref 8.4–10.4)
Chloride: 115 mEq/L — ABNORMAL HIGH (ref 98–109)
EGFR: 11 mL/min/{1.73_m2} — ABNORMAL LOW (ref >90)
GLUCOSE: 95 mg/dL (ref 70–140)
Potassium: 4.4 mEq/L (ref 3.5–5.1)
SODIUM: 142 meq/L (ref 136–145)
TOTAL PROTEIN: 5.6 g/dL — AB (ref 6.4–8.3)

## 2014-11-15 MED ORDER — SODIUM CHLORIDE 0.9 % IV SOLN
Freq: Once | INTRAVENOUS | Status: AC
  Administered 2014-11-15: 12:00:00 via INTRAVENOUS

## 2014-11-15 MED ORDER — DEXTROSE 5 % IV SOLN
37.0000 mg/m2 | Freq: Once | INTRAVENOUS | Status: AC
  Administered 2014-11-15: 70 mg via INTRAVENOUS
  Filled 2014-11-15: qty 35

## 2014-11-15 MED ORDER — SODIUM CHLORIDE 0.9 % IJ SOLN
10.0000 mL | INTRAMUSCULAR | Status: DC | PRN
  Administered 2014-11-15: 10 mL
  Filled 2014-11-15: qty 10

## 2014-11-15 MED ORDER — HEPARIN SOD (PORK) LOCK FLUSH 100 UNIT/ML IV SOLN
500.0000 [IU] | Freq: Once | INTRAVENOUS | Status: AC | PRN
  Administered 2014-11-15: 500 [IU]
  Filled 2014-11-15: qty 5

## 2014-11-15 MED ORDER — SODIUM CHLORIDE 0.9 % IV SOLN
Freq: Once | INTRAVENOUS | Status: AC
  Administered 2014-11-15: 12:00:00 via INTRAVENOUS
  Filled 2014-11-15: qty 4

## 2014-11-15 MED ORDER — SODIUM CHLORIDE 0.9 % IV SOLN
Freq: Once | INTRAVENOUS | Status: AC
  Administered 2014-11-15: 13:00:00 via INTRAVENOUS

## 2014-11-15 MED ORDER — SODIUM CHLORIDE 0.9 % IV SOLN
300.0000 mg/m2 | Freq: Once | INTRAVENOUS | Status: AC
  Administered 2014-11-15: 560 mg via INTRAVENOUS
  Filled 2014-11-15: qty 28

## 2014-11-15 NOTE — Progress Notes (Signed)
Ok to treat pt with labs reviewed from Dr. Worthy Flank note from today.

## 2014-11-15 NOTE — Telephone Encounter (Signed)
per pof pt req to have Tuesday appts moved to 1:00-sent req to MW-pt to get udpated sch 11/1

## 2014-11-15 NOTE — Progress Notes (Signed)
Patient ID: Nancy Norris, female   DOB: Feb 10, 1940, 74 y.o.   MRN: [id number] Berniece Andreas  W                                                                                                     This very nice 74 y.o. DWF presents for 6 month follow up with hx/o MM (2002) s/p stem cell tnsplt (2010) in relapse (2014) and back in active chemotx per Dr Julien Nordmann.    Patient is treated for Hypotension - currently on Midodrine followed by Dr [name]. Today's  . Patient has had no complaints of any cardiac type chest pain, palpitations, dyspnea/orthopnea/PND, dizziness, claudication, or dependent edema. Patient also has stage 5 CKD/ESRD with mature LUE AVF (06/2013) followed actively by Dr Burman Foster.   Hyperlipidemia is controlled with diet & meds. Patient denies myalgias or other med SE's. Last Lipids were at goal with Cholesterol 158; HDL 40*; LDL 86; Triglycerides 160* on 08/13/2014.  Also, the patient has history of T2_DM dx'd May 2012 - in remission with weight loss and last A1c 5.0% on August 13, 2014. She has had no symptoms of reactive hypoglycemia, diabetic polys, paresthesias or visual blurring.      Further, the patient also has history of Vitamin D Deficiency and supplements vitamin D without any suspected side-effects. Last vitamin D was  69 on 08/13/2014.

## 2014-11-15 NOTE — Progress Notes (Signed)
SYMPTOM MANAGEMENT CLINIC  PRINCIPAL DIAGNOSES:  1. Recurrent multiple myeloma initially diagnosed in 2002, initially with smoldering myeloma at [facility].  2. Ductal carcinoma in situ status post mastectomy with sentinel lymph node biopsy in October 2008.  PRIOR THERAPY:  1. Status post treatment with tamoxifen from November 2008 through February 2009, discontinued secondary to intolerance. 2. Status post 3 cycles of chemotherapy with Revlimid and Decadron followed by 1 cycle of Decadron only with mild response. 3. Status post 2 cycles of systemic chemotherapy with Velcade, Doxil and Decadron discontinued secondary to significant peripheral neuropathy. Last dose was given May 2010 at [facility]. 4. Status post autologous peripheral blood stem cell transplant on October 01, 2008 at [facility] under the care of Dr. Phyllis Ginger.  5. Systemic chemotherapy with Carfilzomib initially was 20 mg/M2 and will be increased after cycle #1 to 36 mg/M2 on days 1, 2, 8, 9, 15 and 16 every 4 weeks in addition to Cytoxan 300 mg/M2 and Decadron 40 mg by mouth weekly basis, status post 4 cycles. First cycle on 12/29/2012.   CURRENT THERAPY: She resumed chemotherapy with Carfilzomib, Cytoxan, and Decadron on 05/03/2014. Status post 4 cycles. To finish D15 C5 chemo today 10/31  HPI: Nancy Norris 74 y.o. female diagnosed with multiple myeloma.  Currently undergoing Cytoxan/Kyprolis/Zometa chemotherapy regimen. Her last dose owas on 10/17. She is overall stable.Denies fevers, chills, night sweats, vision changes, or mucositis. Denies any respiratory complaints. Denies any chest pain or palpitations. Denies lower extremity swelling. Denies nausea, heartburn or change in bowel habits.She has abdominal pain related toGI issues for which she is to see her gastroenterologist soon.  Appetite is normal. Denies any dysuria. Denies abnormal skin rashes, or neuropathy. Denies any bleeding  issues such as epistaxis, hematemesis, hematuria or hematochezia. Ambulating without difficulty.   HPI  Review of Systems  Constitutional: Negative for malaise/fatigue.  HENT: Negative.   Eyes: Negative.   Respiratory: Negative.   Cardiovascular: Negative.   Gastrointestinal: Positive for abdominal pain. Negative for heartburn, nausea, vomiting, diarrhea, constipation, blood in stool and melena.       Mild, present over the last week  Genitourinary: Negative.   Musculoskeletal: Negative.   Skin: Negative.   Neurological: Negative.   Endo/Heme/Allergies: Negative.   Psychiatric/Behavioral: Negative.     Past Medical History  Diagnosis Date  . Hyperkalemia   . Hypothyroidism   . COPD (chronic obstructive pulmonary disease) ([place])   . Hyponatremia   . Dizziness   . Fibromyalgia   . Breast cancer ([place])   . Multiple myeloma   . Mucositis   . Hx of echocardiogram     a.  Echocardiogram (12/26/2012): EF 16-10%, grade 1 diastolic dysfunction;   b.  Echocardiogram (02/2013): EF 55-60%, no WMA, trivial effusion  . Fibromyalgia   . Arthritis   . Hx of cardiovascular stress test     LexiScan with low level exercise Myoview (02/2013): No ischemia, EF 72%; normal study  . Complication of anesthesia   . PONV (postoperative nausea and vomiting) 2008    after mastestomy  . Myocardial infarction [facility])     in past, patient was unaware.   . Anginal pain ([place])     used NTG x 2 May 31 and 06/15/13   . GERD (gastroesophageal reflux disease)   . Headache(784.0)   . Anxiety   . Depression   . CKD (chronic kidney disease) stage 3, GFR 30-59 ml/min   . Anemia   .  History of blood transfusion     last one May 12   . Neuropathy [facility])     Past Surgical History  Procedure Laterality Date  . History of port removal    . Status post stem cell transplant on September 28, 2008.    Marland Kitchen Abdominal hysterectomy  1981  . Cholecystectomy  1971  . Mastectomy Left 2008  . Cataract extraction, bilateral     . Breast surgery    . Eye surgery Bilateral     lens implant  . Breast reconstruction    . Portacath placement  12/2012    has had 2  . Av fistula placement Left 06/19/2013    Procedure: CREATION OF LEFT ARM ARTERIOVENOUS (AV) FISTULA ;  Surgeon: Angelia Mould, MD;  Location: [facility] OR;  Service: Vascular;  Laterality: Left;     is allergic to codeine; latex; other; onion; zyprexa; adhesive; iodinated diagnostic agents; and sulfa antibiotics.    Medication List       This list is accurate as of: 11/15/14 11:12 AM.  Always use your most recent med list.               acetaminophen 325 MG tablet  Commonly known as:  TYLENOL  Take 650 mg by mouth every 6 (six) hours as needed for mild pain or moderate pain.     albuterol 108 (90 BASE) MCG/ACT inhaler  Commonly known as:  PROVENTIL HFA;VENTOLIN HFA  Inhale 2 puffs into the lungs 2 (two) times daily as needed for wheezing or shortness of breath.     ascorbic acid 250 MG Chew  Commonly known as:  VITAMIN C  Chew 250 mg by mouth daily.     aspirin EC 81 MG tablet  Take 1 tablet (81 mg total) by mouth daily.     buPROPion 300 MG 24 hr tablet  Commonly known as:  WELLBUTRIN XL  Take 300 mg by mouth daily.     buPROPion 300 MG 24 hr tablet  Commonly known as:  WELLBUTRIN XL  TAKE 1 TABLET BY MOUTH EACH MORNING FOR MOOD AND DEPRESSION.     carboxymethylcellulose 0.5 % Soln  Commonly known as:  REFRESH PLUS  Place 1 drop into both eyes every 2 (two) hours.     Cholecalciferol 4000 UNITS Tabs  Take 4,000 Units by mouth daily.     citalopram 40 MG tablet  Commonly known as:  CELEXA  TAKE 1 TABLET BY MOUTH ONCE DAILY.     cromolyn 4 % ophthalmic solution  Commonly known as:  OPTICROM  Place 1 drop into both eyes 4 (four) times daily.     dexamethasone 4 MG tablet  Commonly known as:  DECADRON  Take 10 tablets by mouth once a week as directed     esomeprazole 40 MG capsule  Commonly known as:  NEXIUM  TAKE 1  CAPSULE BY MOUTH DAILY, EACH MORNING FOR ACID INDIGESTION     Ferrous Sulfate 134 MG Tabs  Take 1 tablet (134 mg total) by mouth every other day.     fluticasone 50 MCG/ACT nasal spray  Commonly known as:  FLONASE  Place 2 sprays into both nostrils at bedtime.     Fluticasone-Salmeterol 100-50 MCG/DOSE Aepb  Commonly known as:  ADVAIR  Inhale 1 puff into the lungs 2 (two) times daily.     furosemide 40 MG tablet  Commonly known as:  LASIX  Take 1 tablet (40 mg total) by mouth daily.  gabapentin 300 MG capsule  Commonly known as:  NEURONTIN  TAKE 1 CAPSULE BY MOUTH AT BEDTIME.     isosorbide mononitrate 30 MG 24 hr tablet  Commonly known as:  IMDUR  TAKE 1 TABLET BY MOUTH DAILY.     levothyroxine 150 MCG tablet  Commonly known as:  SYNTHROID, LEVOTHROID  TAKE 1 TABLET BY MOUTH ONCE DAILY.     loperamide 2 MG capsule  Commonly known as:  IMODIUM  Take two tabs po initially, then one tab after each loose stool: max 8 tabs in 24 hours     loratadine 10 MG tablet  Commonly known as:  CLARITIN  Take 10 mg by mouth daily.     LORazepam 0.5 MG tablet  Commonly known as:  ATIVAN  Take 1 tablet (0.5 mg total) by mouth every 8 (eight) hours as needed (nausea).     loteprednol 0.2 % Susp  Commonly known as:  ALREX  Place 1 drop into both eyes 4 (four) times daily.     meclizine 32 MG tablet  Commonly known as:  ANTIVERT  Take 1 tablet (32 mg total) by mouth 3 (three) times daily as needed.     midodrine 10 MG tablet  Commonly known as:  PROAMATINE  Take 1 tablet (10 mg total) by mouth 3 (three) times daily.     nitroGLYCERIN 0.4 MG SL tablet  Commonly known as:  NITROSTAT  Place 1 tablet (0.4 mg total) under the tongue every 5 (five) minutes as needed for chest pain.     nystatin 100000 UNIT/GM Powd  Apply 1 g topically 2 (two) times daily. Apply to belly     ondansetron 8 MG disintegrating tablet  Commonly known as:  ZOFRAN-ODT  Take 1 tablet (8 mg total) by  mouth every 8 (eight) hours as needed for nausea or vomiting.     pilocarpine 5 MG tablet  Commonly known as:  SALAGEN  TAKE 1 TABLET BY MOUTH 3 TIMES DAILY.     polyethylene glycol packet  Commonly known as:  MIRALAX / GLYCOLAX  Take 17 g by mouth daily.     prochlorperazine 10 MG tablet  Commonly known as:  COMPAZINE  TAKE 1 TABLET BY MOUTH EVERY 6 HOURS AS NEEDED FOR NAUSEA OR VOMITING.     ranitidine 300 MG tablet  Commonly known as:  ZANTAC  TAKE 1 TABLET BY MOUTH AT BEDTIME, FOR ACID REFLUX AND INDIGESTION.     RESTASIS 0.05 % ophthalmic emulsion  Generic drug:  cycloSPORINE  Place 1 drop into both eyes 2 (two) times daily.         PHYSICAL EXAMINATION Filed Vitals:   11/15/14 1101  BP: 110/50  Pulse: 67  Temp: 98.3 F (36.8 C)  Resp: 20     Physical Exam  Constitutional: She is oriented to person, place, and time and well-developed, well-nourished, and in no distress.  HENT:  Head: Normocephalic and atraumatic.  Eyes: Conjunctivae and EOM are normal. Pupils are equal, round, and reactive to light. Right eye exhibits no discharge. Left eye exhibits no discharge. No scleral icterus.  Neck: Normal range of motion. Neck supple. No JVD present. No tracheal deviation present. No thyromegaly present.  Cardiovascular: Normal rate, regular rhythm, normal heart sounds and intact distal pulses.   Pulmonary/Chest: Effort normal and breath sounds normal. No stridor. No respiratory distress. She has no wheezes. She has no rales. She exhibits no tenderness.  Abdominal: Soft. Bowel sounds are normal. She exhibits  no distension and no mass. There is no hepatosplenomegaly. There is tenderness in the left upper quadrant. There is no rigidity, no rebound, no guarding, no CVA tenderness, no tenderness at McBurney's point and negative Murphy's sign.  Mild, worse on deep palpation  Musculoskeletal: Normal range of motion. She exhibits no edema or tenderness.  Lymphadenopathy:    She  has no cervical adenopathy.  Neurological: She is alert and oriented to person, place, and time. Gait normal.  Skin: Skin is warm and dry. No rash noted. No erythema. No pallor.  Psychiatric:  Slightly anxious.  Nursing note and vitals reviewed.   LABORATORY DATA:.  CBC Latest Ref Rng 11/15/2014 11/08/2014 11/01/2014  WBC 3.9 - 10.3 10e3/uL 4.0 5.6 6.9  Hemoglobin 11.6 - 15.9 g/dL 8.9(L) 9.5(L) 10.5(L)  Hematocrit 34.8 - 46.6 % 27.2(L) 28.9(L) 32.0(L)  Platelets 145 - 400 10e3/uL 84(L) 86(L) 137(L)   CMP Latest Ref Rng 11/08/2014 11/01/2014 10/15/2014  Glucose 70 - 140 mg/dl 113 102 113  BUN 7.0 - 26.0 mg/dL 35.9(H) 31.3(H) 39.5(H)  Creatinine 0.6 - 1.1 mg/dL 4.1(HH) 3.3(HH) 4.4(HH)  Sodium 136 - 145 mEq/L 145 141 142  Potassium 3.5 - 5.1 mEq/L 4.0 4.2 4.1  Chloride 98 - 110 mmol/L - - -  CO2 22 - 29 mEq/L 19(L) 20(L) 23  Calcium 8.4 - 10.4 mg/dL 8.7 8.9 10.6(H)  Total Protein 6.4 - 8.3 g/dL 5.7(L) 6.1(L) 6.4  Total Bilirubin 0.20 - 1.20 mg/dL 0.49 0.52 0.43  Alkaline Phos 40 - 150 U/L 73 82 94  AST 5 - 34 U/L _0 ALT 0 - 55 U/L _1 RADIOGRAPHIC STUDIES: No results found.  ASSESSMENT/PLAN:    Multiple Myeloma:  Currently undergoing Cytoxan/Kyprolis/Zometa chemotherapy regimen, last dose given on 10/17 She is here for C15 C5 chemotherapy She will return in 2 weeks prior to next treatment Review/collaboration with Dr. Julien Nordmann regarding all aspects of patient's visit today.  Will proceed with treatment  Anemia in neoplastic disease and chronic disease Hb 8.9 today with no bleeding issues present Will continue to monitor closely with weekly labs as scheduled. She also has an appointment with Dr. Penelope Coop in the near future to evaluate her abdominal complaints, and an appointment with her renal team to follow up on her renal disease  Patient stated understanding of all instructions; and was in agreement with this plan of care. The patient knows to call the  clinic with any problems, questions or concerns.     Total time spent with patient was 25 minutes;  with greater than 75 percent of that time spent in face to face counseling regarding patient's symptoms,  and coordination of care and follow up.   Rondel Jumbo, PA-C 11/15/2014   ADDENDUM: Hematology/Oncology Attending: I had a face to face encounter with the patient today. I recommended her care plan. This is a very pleasant 74 years old white female with relapsed multiple myeloma currently undergoing treatment with Cytoxan, Carfilzomib and Decadron status post 4 cycles and she is currently undergoing cycle #5. She has been tolerating her treatment fairly well except for generalized fatigue. She also has left-sided abdominal pain and she is scheduled to see a gastroenterologist soon for evaluation of her condition. I recommended for the patient to proceed with day 15 and 16 of the first cycle as a scheduled. She is off treatment next week. The patient would come back for follow-up visit in 2 weeks for reevaluation before starting cycle #  6. For the anemia of chronic disease, we'll continue to monitor the patient closely and consider her for PRBCs transfusion if her hemoglobin is less than 8.0  G/dL. The patient was advised to call immediately if she has any concerning symptoms in the interval.  Disclaimer: This note was dictated with voice recognition software. Similar sounding words can inadvertently be transcribed and may be missed upon review. [name]., MD 11/15/2014

## 2014-11-15 NOTE — Telephone Encounter (Signed)
Per staff message and POF I have scheduled appts. Advised scheduler of appts. JMW  

## 2014-11-15 NOTE — Patient Instructions (Signed)
[facility] Discharge Instructions for Patients Receiving Chemotherapy  Today you received the following chemotherapy agents cytoxan, kyprolis  To help prevent nausea and vomiting after your treatment, we encourage you to take your nausea medication   If you develop nausea and vomiting that is not controlled by your nausea medication, call the clinic.   BELOW ARE SYMPTOMS THAT SHOULD BE REPORTED IMMEDIATELY:  *FEVER GREATER THAN 100.5 F  *CHILLS WITH OR WITHOUT FEVER  NAUSEA AND VOMITING THAT IS NOT CONTROLLED WITH YOUR NAUSEA MEDICATION  *UNUSUAL SHORTNESS OF BREATH  *UNUSUAL BRUISING OR BLEEDING  TENDERNESS IN MOUTH AND THROAT WITH OR WITHOUT PRESENCE OF ULCERS  *URINARY PROBLEMS  *BOWEL PROBLEMS  UNUSUAL RASH Items with * indicate a potential emergency and should be followed up as soon as possible.  Feel free to call the clinic you have any questions or concerns. The clinic phone number is [phone].  Please show the [place] at check-in to the Emergency Department and triage nurse.

## 2014-11-16 ENCOUNTER — Ambulatory Visit (HOSPITAL_BASED_OUTPATIENT_CLINIC_OR_DEPARTMENT_OTHER): Payer: Medicare Other

## 2014-11-16 VITALS — BP 125/48 | HR 75 | Temp 98.6°F

## 2014-11-16 DIAGNOSIS — Z5112 Encounter for antineoplastic immunotherapy: Secondary | ICD-10-CM

## 2014-11-16 DIAGNOSIS — C9000 Multiple myeloma not having achieved remission: Secondary | ICD-10-CM | POA: Diagnosis not present

## 2014-11-16 MED ORDER — HEPARIN SOD (PORK) LOCK FLUSH 100 UNIT/ML IV SOLN
500.0000 [IU] | Freq: Once | INTRAVENOUS | Status: AC | PRN
  Administered 2014-11-16: 500 [IU]
  Filled 2014-11-16: qty 5

## 2014-11-16 MED ORDER — SODIUM CHLORIDE 0.9 % IJ SOLN
10.0000 mL | INTRAMUSCULAR | Status: DC | PRN
  Administered 2014-11-16: 10 mL
  Filled 2014-11-16: qty 10

## 2014-11-16 MED ORDER — SODIUM CHLORIDE 0.9 % IV SOLN
Freq: Once | INTRAVENOUS | Status: AC
  Administered 2014-11-16: 11:00:00 via INTRAVENOUS

## 2014-11-16 MED ORDER — SODIUM CHLORIDE 0.9 % IV SOLN
Freq: Once | INTRAVENOUS | Status: AC
  Administered 2014-11-16: 11:00:00 via INTRAVENOUS
  Filled 2014-11-16: qty 4

## 2014-11-16 MED ORDER — DEXTROSE 5 % IV SOLN
37.0000 mg/m2 | Freq: Once | INTRAVENOUS | Status: AC
  Administered 2014-11-16: 70 mg via INTRAVENOUS
  Filled 2014-11-16: qty 35

## 2014-11-16 NOTE — Patient Instructions (Signed)
[facility] Discharge Instructions for Patients Receiving Chemotherapy  Today you received the following chemotherapy agents Kyprolis.  To help prevent nausea and vomiting after your treatment, we encourage you to take your nausea medication as prescribed.   If you develop nausea and vomiting that is not controlled by your nausea medication, call the clinic.   BELOW ARE SYMPTOMS THAT SHOULD BE REPORTED IMMEDIATELY:  *FEVER GREATER THAN 100.5 F  *CHILLS WITH OR WITHOUT FEVER  NAUSEA AND VOMITING THAT IS NOT CONTROLLED WITH YOUR NAUSEA MEDICATION  *UNUSUAL SHORTNESS OF BREATH  *UNUSUAL BRUISING OR BLEEDING  TENDERNESS IN MOUTH AND THROAT WITH OR WITHOUT PRESENCE OF ULCERS  *URINARY PROBLEMS  *BOWEL PROBLEMS  UNUSUAL RASH Items with * indicate a potential emergency and should be followed up as soon as possible.  Feel free to call the clinic you have any questions or concerns. The clinic phone number is [phone].  Please show the CHEMO ALERT CARD at check-in to the Emergency Department and triage nurse.   

## 2014-11-18 ENCOUNTER — Telehealth: Payer: Self-pay | Admitting: Internal Medicine

## 2014-11-18 NOTE — Telephone Encounter (Signed)
Faxed pt medical records to [facility] °

## 2014-11-22 ENCOUNTER — Other Ambulatory Visit: Payer: Medicare Other

## 2014-11-29 ENCOUNTER — Encounter: Payer: Self-pay | Admitting: Internal Medicine

## 2014-11-29 ENCOUNTER — Ambulatory Visit (HOSPITAL_BASED_OUTPATIENT_CLINIC_OR_DEPARTMENT_OTHER): Payer: Medicare Other | Admitting: Internal Medicine

## 2014-11-29 ENCOUNTER — Ambulatory Visit (HOSPITAL_BASED_OUTPATIENT_CLINIC_OR_DEPARTMENT_OTHER): Payer: Medicare Other

## 2014-11-29 ENCOUNTER — Other Ambulatory Visit (HOSPITAL_BASED_OUTPATIENT_CLINIC_OR_DEPARTMENT_OTHER): Payer: Medicare Other

## 2014-11-29 VITALS — BP 109/52 | HR 68 | Temp 98.5°F | Resp 18 | Ht 66.0 in | Wt 152.4 lb

## 2014-11-29 DIAGNOSIS — Z5111 Encounter for antineoplastic chemotherapy: Secondary | ICD-10-CM | POA: Diagnosis not present

## 2014-11-29 DIAGNOSIS — Z5112 Encounter for antineoplastic immunotherapy: Secondary | ICD-10-CM

## 2014-11-29 DIAGNOSIS — C9000 Multiple myeloma not having achieved remission: Secondary | ICD-10-CM

## 2014-11-29 LAB — CBC WITH DIFFERENTIAL/PLATELET
BASO%: 1.4 % (ref 0.0–2.0)
BASOS ABS: 0.1 10*3/uL (ref 0.0–0.1)
EOS ABS: 0.2 10*3/uL (ref 0.0–0.5)
EOS%: 4.3 % (ref 0.0–7.0)
HCT: 29.6 % — ABNORMAL LOW (ref 34.8–46.6)
HEMOGLOBIN: 9.6 g/dL — AB (ref 11.6–15.9)
LYMPH%: 14 % (ref 14.0–49.7)
MCH: 34.5 pg — AB (ref 25.1–34.0)
MCHC: 32.3 g/dL (ref 31.5–36.0)
MCV: 106.8 fL — AB (ref 79.5–101.0)
MONO#: 1.1 10*3/uL — AB (ref 0.1–0.9)
MONO%: 22.4 % — ABNORMAL HIGH (ref 0.0–14.0)
NEUT%: 57.9 % (ref 38.4–76.8)
NEUTROS ABS: 2.9 10*3/uL (ref 1.5–6.5)
PLATELETS: 184 10*3/uL (ref 145–400)
RBC: 2.77 10*6/uL — ABNORMAL LOW (ref 3.70–5.45)
RDW: 14.2 % (ref 11.2–14.5)
WBC: 5 10*3/uL (ref 3.9–10.3)
lymph#: 0.7 10*3/uL — ABNORMAL LOW (ref 0.9–3.3)

## 2014-11-29 LAB — COMPREHENSIVE METABOLIC PANEL (CC13)
ALBUMIN: 4.1 g/dL (ref 3.5–5.0)
ALK PHOS: 104 U/L (ref 40–150)
ALT: 14 U/L (ref 0–55)
ANION GAP: 12 meq/L — AB (ref 3–11)
AST: 13 U/L (ref 5–34)
BILIRUBIN TOTAL: 1.07 mg/dL (ref 0.20–1.20)
BUN: 45.2 mg/dL — ABNORMAL HIGH (ref 7.0–26.0)
CO2: 20 mEq/L — ABNORMAL LOW (ref 22–29)
Calcium: 9.4 mg/dL (ref 8.4–10.4)
Chloride: 109 mEq/L (ref 98–109)
Creatinine: 3.8 mg/dL (ref 0.6–1.1)
EGFR: 11 mL/min/{1.73_m2} — AB (ref >90)
Glucose: 98 mg/dl (ref 70–140)
POTASSIUM: 4.6 meq/L (ref 3.5–5.1)
Sodium: 142 mEq/L (ref 136–145)
TOTAL PROTEIN: 6.5 g/dL (ref 6.4–8.3)

## 2014-11-29 LAB — HOLD TUBE, BLOOD BANK

## 2014-11-29 MED ORDER — SODIUM CHLORIDE 0.9 % IJ SOLN
10.0000 mL | INTRAMUSCULAR | Status: DC | PRN
  Administered 2014-11-29: 10 mL
  Filled 2014-11-29: qty 10

## 2014-11-29 MED ORDER — DEXAMETHASONE SODIUM PHOSPHATE 100 MG/10ML IJ SOLN
Freq: Once | INTRAMUSCULAR | Status: AC
  Administered 2014-11-29: 14:00:00 via INTRAVENOUS
  Filled 2014-11-29: qty 4

## 2014-11-29 MED ORDER — SODIUM CHLORIDE 0.9 % IV SOLN
Freq: Once | INTRAVENOUS | Status: AC
  Administered 2014-11-29: 13:00:00 via INTRAVENOUS

## 2014-11-29 MED ORDER — DEXTROSE 5 % IV SOLN
37.0000 mg/m2 | Freq: Once | INTRAVENOUS | Status: AC
  Administered 2014-11-29: 70 mg via INTRAVENOUS
  Filled 2014-11-29: qty 35

## 2014-11-29 MED ORDER — CYCLOPHOSPHAMIDE CHEMO INJECTION 1 GM
300.0000 mg/m2 | Freq: Once | INTRAMUSCULAR | Status: AC
  Administered 2014-11-29: 560 mg via INTRAVENOUS
  Filled 2014-11-29: qty 28

## 2014-11-29 MED ORDER — HEPARIN SOD (PORK) LOCK FLUSH 100 UNIT/ML IV SOLN
500.0000 [IU] | Freq: Once | INTRAVENOUS | Status: AC | PRN
Start: 2014-11-29 — End: 2014-11-29
  Administered 2014-11-29: 500 [IU]
  Filled 2014-11-29: qty 5

## 2014-11-29 NOTE — Progress Notes (Signed)
[place] [phone]   [phone]  OFFICE PROGRESS NOTE  [name], MD [address] [place] [address]  PRINCIPAL DIAGNOSES:  1. Recurrent multiple myeloma initially diagnosed in 2002, initially with smoldering myeloma at [facility].  2. Ductal carcinoma in situ status post mastectomy with sentinel lymph node biopsy in October 2008.  PRIOR THERAPY:  1. Status post treatment with tamoxifen from November 2008 through February 2009, discontinued secondary to intolerance. 2. Status post 3 cycles of chemotherapy with Revlimid and Decadron followed by 1 cycle of Decadron only with mild response. 3. Status post 2 cycles of systemic chemotherapy with Velcade, Doxil and Decadron discontinued secondary to significant peripheral neuropathy. Last dose was given May 2010 at [facility]. 4. Status post autologous peripheral blood stem cell transplant on October 01, 2008 at [facility] under the care of Dr. Phyllis Ginger.  5. Systemic chemotherapy with Carfilzomib initially was 20 mg/M2 and will be increased after cycle #1 to 36 mg/M2 on days 1, 2, 8, 9, 15 and 16 every 4 weeks in addition to Cytoxan 300 mg/M2 and Decadron 40 mg by mouth weekly basis, status post 4 cycles. First cycle on 12/29/2012.   CURRENT THERAPY: She resumed chemotherapy with Carfilzomib, Cytoxan, and Decadron on 05/03/2014. Status post 5 cycles.   INTERVAL HISTORY: Nancy Norris 74 y.o. female returns to the clinic today for follow-up visit. The patient was started on treatment again with Carfilzomib, Cytoxan and Decadron and tolerated the last cycle well except for the persistent fatigue and generalized weakness. She has end-stage renal disease but she is not interested in hemodialysis yet. She denied having any significant chest pain, shortness of breath, cough or hemoptysis. She denied having any bleeding issues. The patient  denied having any significant nausea or vomiting. She has no fever or chills. She has no significant weight loss or night sweats. She is here today to start cycle #6 of her treatment.  MEDICAL HISTORY: Past Medical History  Diagnosis Date  . Hyperkalemia   . Hypothyroidism   . COPD (chronic obstructive pulmonary disease) ([place])   . Hyponatremia   . Dizziness   . Fibromyalgia   . Breast cancer ([place])   . Multiple myeloma   . Mucositis   . Hx of echocardiogram     a.  Echocardiogram (12/26/2012): EF 41-93%, grade 1 diastolic dysfunction;   b.  Echocardiogram (02/2013): EF 55-60%, no WMA, trivial effusion  . Fibromyalgia   . Arthritis   . Hx of cardiovascular stress test     LexiScan with low level exercise Myoview (02/2013): No ischemia, EF 72%; normal study  . Complication of anesthesia   . PONV (postoperative nausea and vomiting) 2008    after mastestomy  . Myocardial infarction [facility])     in past, patient was unaware.   . Anginal pain ([place])     used NTG x 2 May 31 and 06/15/13   . GERD (gastroesophageal reflux disease)   . Headache(784.0)   . Anxiety   . Depression   . CKD (chronic kidney disease) stage 3, GFR 30-59 ml/min   . Anemia   . History of blood transfusion     last one May 12   . Neuropathy ([facility])     ALLERGIES:  is allergic to codeine; latex; other; onion; zyprexa; adhesive; iodinated diagnostic agents; and sulfa antibiotics.  MEDICATIONS:  Current Outpatient Prescriptions  Medication Sig Dispense Refill  .  acetaminophen (TYLENOL) 325 MG tablet Take 650 mg by mouth every 6 (six) hours as needed for mild pain or moderate pain.     Marland Kitchen albuterol (PROVENTIL HFA;VENTOLIN HFA) 108 (90 BASE) MCG/ACT inhaler Inhale 2 puffs into the lungs 2 (two) times daily as needed for wheezing or shortness of breath. (Patient not taking: Reported on 10/21/2014) 1 each 6  . ascorbic acid (VITAMIN C) 250 MG CHEW Chew 250 mg by mouth daily.    Marland Kitchen aspirin EC 81 MG tablet Take 1 tablet (81 mg  total) by mouth daily.    Marland Kitchen buPROPion (WELLBUTRIN XL) 300 MG 24 hr tablet Take 300 mg by mouth daily.   98  . buPROPion (WELLBUTRIN XL) 300 MG 24 hr tablet TAKE 1 TABLET BY MOUTH EACH MORNING FOR MOOD AND DEPRESSION. 30 tablet PRN  . carboxymethylcellulose (REFRESH PLUS) 0.5 % SOLN Place 1 drop into both eyes every 2 (two) hours.    . Cholecalciferol 4000 UNITS TABS Take 4,000 Units by mouth daily.    . citalopram (CELEXA) 40 MG tablet TAKE 1 TABLET BY MOUTH ONCE DAILY. 90 tablet 1  . cromolyn (OPTICROM) 4 % ophthalmic solution Place 1 drop into both eyes 4 (four) times daily.    Marland Kitchen dexamethasone (DECADRON) 4 MG tablet Take 10 tablets by mouth once a week as directed (Patient not taking: Reported on 10/21/2014) 40 tablet 3  . esomeprazole (NEXIUM) 40 MG capsule TAKE 1 CAPSULE BY MOUTH DAILY, EACH MORNING FOR ACID INDIGESTION 30 capsule PRN  . Ferrous Sulfate 134 MG TABS Take 1 tablet (134 mg total) by mouth every other day. 60 tablet 2  . fluticasone (FLONASE) 50 MCG/ACT nasal spray Place 2 sprays into both nostrils at bedtime.     . Fluticasone-Salmeterol (ADVAIR) 100-50 MCG/DOSE AEPB Inhale 1 puff into the lungs 2 (two) times daily. 60 each 3  . furosemide (LASIX) 40 MG tablet Take 1 tablet (40 mg total) by mouth daily. 90 tablet 1  . gabapentin (NEURONTIN) 300 MG capsule TAKE 1 CAPSULE BY MOUTH AT BEDTIME. 90 capsule 3  . isosorbide mononitrate (IMDUR) 30 MG 24 hr tablet TAKE 1 TABLET BY MOUTH DAILY. 30 tablet 11  . levothyroxine (SYNTHROID, LEVOTHROID) 150 MCG tablet TAKE 1 TABLET BY MOUTH ONCE DAILY. 90 tablet 1  . loperamide (IMODIUM) 2 MG capsule Take two tabs po initially, then one tab after each loose stool: max 8 tabs in 24 hours (Patient not taking: Reported on 10/21/2014) 30 capsule 0  . loratadine (CLARITIN) 10 MG tablet Take 10 mg by mouth daily.    Marland Kitchen LORazepam (ATIVAN) 0.5 MG tablet Take 1 tablet (0.5 mg total) by mouth every 8 (eight) hours as needed (nausea). 30 tablet 0  .  loteprednol (ALREX) 0.2 % SUSP Place 1 drop into both eyes 4 (four) times daily. 1 Bottle PRN  . meclizine (ANTIVERT) 32 MG tablet Take 1 tablet (32 mg total) by mouth 3 (three) times daily as needed. 30 tablet 0  . midodrine (PROAMATINE) 10 MG tablet Take 1 tablet (10 mg total) by mouth 3 (three) times daily. 90 tablet 11  . nitroGLYCERIN (NITROSTAT) 0.4 MG SL tablet Place 1 tablet (0.4 mg total) under the tongue every 5 (five) minutes as needed for chest pain. (Patient not taking: Reported on 10/21/2014) 25 tablet 3  . nystatin (MYCOSTATIN/NYSTOP) 100000 UNIT/GM POWD Apply 1 g topically 2 (two) times daily. Apply to belly    . ondansetron (ZOFRAN-ODT) 8 MG disintegrating tablet Take 1 tablet (  8 mg total) by mouth every 8 (eight) hours as needed for nausea or vomiting. (Patient not taking: Reported on 10/21/2014) 20 tablet 3  . pilocarpine (SALAGEN) 5 MG tablet TAKE 1 TABLET BY MOUTH 3 TIMES DAILY. 90 tablet 0  . polyethylene glycol (MIRALAX / GLYCOLAX) packet Take 17 g by mouth daily.    . prochlorperazine (COMPAZINE) 10 MG tablet TAKE 1 TABLET BY MOUTH EVERY 6 HOURS AS NEEDED FOR NAUSEA OR VOMITING. (Patient not taking: Reported on 08/23/2014) 60 tablet 0  . ranitidine (ZANTAC) 300 MG tablet TAKE 1 TABLET BY MOUTH AT BEDTIME, FOR ACID REFLUX AND INDIGESTION. 30 tablet PRN  . RESTASIS 0.05 % ophthalmic emulsion Place 1 drop into both eyes 2 (two) times daily.   4   No current facility-administered medications for this visit.   Facility-Administered Medications Ordered in Other Visits  Medication Dose Route Frequency Provider Last Rate Last Dose  . 0.9 %  sodium chloride infusion   Intravenous Once Adrena E Johnson, PA-C      . sodium chloride 0.9 % injection 10 mL  10 mL Intracatheter PRN Curt Bears, MD   10 mL at 01/05/13 1825  . sodium chloride 0.9 % injection 10 mL  10 mL Intracatheter PRN Curt Bears, MD   10 mL at 06/15/14 1514    SURGICAL HISTORY:  Past Surgical History  Procedure  Laterality Date  . History of port removal    . Status post stem cell transplant on September 28, 2008.    Marland Kitchen Abdominal hysterectomy  1981  . Cholecystectomy  1971  . Mastectomy Left 2008  . Cataract extraction, bilateral    . Breast surgery    . Eye surgery Bilateral     lens implant  . Breast reconstruction    . Portacath placement  12/2012    has had 2  . Av fistula placement Left 06/19/2013    Procedure: CREATION OF LEFT ARM ARTERIOVENOUS (AV) FISTULA ;  Surgeon: Angelia Mould, MD;  Location: [facility] OR;  Service: Vascular;  Laterality: Left;    REVIEW OF SYSTEMS:  A comprehensive review of systems was negative except for: Constitutional: positive for fatigue   PHYSICAL EXAMINATION: General appearance: alert, cooperative and no distress Head: Normocephalic, without obvious abnormality, atraumatic Neck: no adenopathy, no JVD, supple, symmetrical, trachea midline and thyroid not enlarged, symmetric, no tenderness/mass/nodules Lymph nodes: Cervical, supraclavicular, and axillary nodes normal. Resp: clear to auscultation bilaterally Back: symmetric, no curvature. ROM normal. No CVA tenderness. Cardio: regular rate and rhythm, S1, S2 normal, no murmur, click, rub or gallop GI: soft, non-tender; bowel sounds normal; no masses,  no organomegaly Extremities: extremities normal, atraumatic, no cyanosis or edema Neurologic: Alert and oriented X 3, normal strength and tone. Normal symmetric reflexes. Normal coordination and gait  ECOG PERFORMANCE STATUS: 1 - Symptomatic but completely ambulatory  Blood pressure 109/52, pulse 68, temperature 98.5 F (36.9 C), temperature source Oral, resp. rate 18, height $RemoveBe'5\' 6"'SwowejogK$  (1.676 m), weight 152 lb 6.4 oz (69.128 kg), SpO2 99 %.  LABORATORY DATA: Lab Results  Component Value Date   WBC 5.0 11/29/2014   HGB 9.6* 11/29/2014   HCT 29.6* 11/29/2014   MCV 106.8* 11/29/2014   PLT 184 11/29/2014      Chemistry      Component Value Date/Time   NA  142 11/29/2014 1115   NA 137 08/13/2014 0959   K 4.6 11/29/2014 1115   K 3.6 08/13/2014 0959   CL 105 08/13/2014 0959  CL 105 03/19/2012 0811   CO2 20* 11/29/2014 1115   CO2 23 08/13/2014 0959   BUN 45.2* 11/29/2014 1115   BUN 40* 08/13/2014 0959   CREATININE 3.8* 11/29/2014 1115   CREATININE 3.20* 08/13/2014 0959   CREATININE 4.29* 04/03/2014 0306      Component Value Date/Time   CALCIUM 9.4 11/29/2014 1115   CALCIUM 8.6 08/13/2014 0959   ALKPHOS 104 11/29/2014 1115   ALKPHOS 113 08/13/2014 0959   AST 13 11/29/2014 1115   AST 16 08/13/2014 0959   ALT 14 11/29/2014 1115   ALT 13 08/13/2014 0959   BILITOT 1.07 11/29/2014 1115   BILITOT 1.1 08/13/2014 0959     Myeloma panel: Beta-2 microglobulin 13.40, free kappa light chain 4.59, free lambda light chain 0.98,/lambda ratio 4.68. IgG 315, IgA 10 and IgM 20.  RADIOGRAPHIC STUDIES: No results found.  ASSESSMENT AND PLAN: This is a very pleasant 74 years old white female with history of multiple myeloma as well as chronic kidney disease status post several chemotherapy regimen and most recently treated with Carfilzomib, Cytoxan and Decadron status post 5 cycles. The patient has been tolerating her treatment well except for the fatigue secondary to end-stage renal disease as well as anemia of chronic disease. I recommended for her to proceed with cycle #6 today as scheduled. I will see the patient back for follow-up visit in one month's for reevaluation after repeating myeloma panel. I will see her back for follow-up visit in 4 weeks for reevaluation and management of any adverse effect of her treatment. For the chronic kidney disease, the patient will continue her routine follow-up visit with her nephrologist. The patient was advised to call immediately if she has any concerning symptoms in the interval. The patient voices understanding of current disease status and treatment options and is in agreement with the current care  plan.  All questions were answered. The patient knows to call the clinic with any problems, questions or concerns. We can certainly see the patient much sooner if necessary.  Disclaimer: This note was dictated with voice recognition software. Similar sounding words can inadvertently be transcribed and may not be corrected upon review.

## 2014-11-29 NOTE — Progress Notes (Signed)
Per Dr. Julien Nordmann it is ojkay to treat pt today with chemo and today's lab results.

## 2014-11-29 NOTE — Patient Instructions (Signed)
[place] Discharge Instructions for Patients Receiving Chemotherapy  Today you received the following chemotherapy agents kyprolis, cytoxan  To help prevent nausea and vomiting after your treatment, we encourage you to take your nausea medication    If you develop nausea and vomiting that is not controlled by your nausea medication, call the clinic.   BELOW ARE SYMPTOMS THAT SHOULD BE REPORTED IMMEDIATELY:  *FEVER GREATER THAN 100.5 F  *CHILLS WITH OR WITHOUT FEVER  NAUSEA AND VOMITING THAT IS NOT CONTROLLED WITH YOUR NAUSEA MEDICATION  *UNUSUAL SHORTNESS OF BREATH  *UNUSUAL BRUISING OR BLEEDING  TENDERNESS IN MOUTH AND THROAT WITH OR WITHOUT PRESENCE OF ULCERS  *URINARY PROBLEMS  *BOWEL PROBLEMS  UNUSUAL RASH Items with * indicate a potential emergency and should be followed up as soon as possible.  Feel free to call the clinic you have any questions or concerns. The clinic phone number is [phone].  Please show the [place] at check-in to the Emergency Department and triage nurse.

## 2014-11-30 ENCOUNTER — Ambulatory Visit (HOSPITAL_BASED_OUTPATIENT_CLINIC_OR_DEPARTMENT_OTHER): Payer: Medicare Other

## 2014-11-30 VITALS — BP 94/40 | HR 71 | Temp 98.6°F | Resp 18

## 2014-11-30 DIAGNOSIS — Z5112 Encounter for antineoplastic immunotherapy: Secondary | ICD-10-CM | POA: Diagnosis present

## 2014-11-30 DIAGNOSIS — C9000 Multiple myeloma not having achieved remission: Secondary | ICD-10-CM | POA: Diagnosis not present

## 2014-11-30 MED ORDER — HEPARIN SOD (PORK) LOCK FLUSH 100 UNIT/ML IV SOLN
500.0000 [IU] | Freq: Once | INTRAVENOUS | Status: AC | PRN
  Administered 2014-11-30: 500 [IU]
  Filled 2014-11-30: qty 5

## 2014-11-30 MED ORDER — SODIUM CHLORIDE 0.9 % IV SOLN
Freq: Once | INTRAVENOUS | Status: AC
  Administered 2014-11-30: 14:00:00 via INTRAVENOUS
  Filled 2014-11-30: qty 4

## 2014-11-30 MED ORDER — DEXTROSE 5 % IV SOLN
37.0000 mg/m2 | Freq: Once | INTRAVENOUS | Status: AC
  Administered 2014-11-30: 70 mg via INTRAVENOUS
  Filled 2014-11-30: qty 35

## 2014-11-30 MED ORDER — SODIUM CHLORIDE 0.9 % IV SOLN
Freq: Once | INTRAVENOUS | Status: AC
  Administered 2014-11-30: 14:00:00 via INTRAVENOUS

## 2014-11-30 MED ORDER — SODIUM CHLORIDE 0.9 % IJ SOLN
10.0000 mL | INTRAMUSCULAR | Status: DC | PRN
  Administered 2014-11-30: 10 mL
  Filled 2014-11-30: qty 10

## 2014-11-30 NOTE — Progress Notes (Signed)
Dr. Julien Nordmann aware of patient's BP before and after IVF and treatment today. Patient was discharged by nurse when she mentioned to nurse she was feeling dizzy at times. She had IVF today as ordered with treatment and ate a meal while here and slept for the duration of visit. She is alert and oriented and ambulates fine, declined assistance to lobby. She was encouraged by nurse per MD to increase her po fluid intake at home and call if not feeling worse.

## 2014-11-30 NOTE — Patient Instructions (Signed)
[facility] Discharge Instructions for Patients Receiving Chemotherapy  Today you received the following chemotherapy agents: Kyprolis   To help prevent nausea and vomiting after your treatment, we encourage you to take your nausea medication as directed.    If you develop nausea and vomiting that is not controlled by your nausea medication, call the clinic.   BELOW ARE SYMPTOMS THAT SHOULD BE REPORTED IMMEDIATELY:  *FEVER GREATER THAN 100.5 F  *CHILLS WITH OR WITHOUT FEVER  NAUSEA AND VOMITING THAT IS NOT CONTROLLED WITH YOUR NAUSEA MEDICATION  *UNUSUAL SHORTNESS OF BREATH  *UNUSUAL BRUISING OR BLEEDING  TENDERNESS IN MOUTH AND THROAT WITH OR WITHOUT PRESENCE OF ULCERS  *URINARY PROBLEMS  *BOWEL PROBLEMS  UNUSUAL RASH Items with * indicate a potential emergency and should be followed up as soon as possible.  Feel free to call the clinic you have any questions or concerns. The clinic phone number is [phone].  Please show the CHEMO ALERT CARD at check-in to the Emergency Department and triage nurse.   

## 2014-12-02 ENCOUNTER — Emergency Department (HOSPITAL_COMMUNITY): Payer: Medicare Other

## 2014-12-02 ENCOUNTER — Inpatient Hospital Stay (HOSPITAL_COMMUNITY)
Admission: EM | Admit: 2014-12-02 | Discharge: 2014-12-07 | DRG: 812 | Disposition: A | Discharge status: Home / Self Care | Payer: Medicare Other | Attending: Internal Medicine | Admitting: Internal Medicine

## 2014-12-02 ENCOUNTER — Other Ambulatory Visit: Payer: Self-pay

## 2014-12-02 ENCOUNTER — Encounter (HOSPITAL_COMMUNITY): Payer: Self-pay

## 2014-12-02 DIAGNOSIS — K219 Gastro-esophageal reflux disease without esophagitis: Secondary | ICD-10-CM | POA: Diagnosis present

## 2014-12-02 DIAGNOSIS — E538 Deficiency of other specified B group vitamins: Secondary | ICD-10-CM | POA: Diagnosis present

## 2014-12-02 DIAGNOSIS — E872 Acidosis: Secondary | ICD-10-CM | POA: Diagnosis not present

## 2014-12-02 DIAGNOSIS — T451X5A Adverse effect of antineoplastic and immunosuppressive drugs, initial encounter: Secondary | ICD-10-CM | POA: Diagnosis present

## 2014-12-02 DIAGNOSIS — D696 Thrombocytopenia, unspecified: Secondary | ICD-10-CM | POA: Diagnosis present

## 2014-12-02 DIAGNOSIS — Z87891 Personal history of nicotine dependence: Secondary | ICD-10-CM

## 2014-12-02 DIAGNOSIS — I2699 Other pulmonary embolism without acute cor pulmonale: Secondary | ICD-10-CM

## 2014-12-02 DIAGNOSIS — C9000 Multiple myeloma not having achieved remission: Secondary | ICD-10-CM | POA: Diagnosis present

## 2014-12-02 DIAGNOSIS — N184 Chronic kidney disease, stage 4 (severe): Secondary | ICD-10-CM | POA: Diagnosis not present

## 2014-12-02 DIAGNOSIS — Z853 Personal history of malignant neoplasm of breast: Secondary | ICD-10-CM

## 2014-12-02 DIAGNOSIS — D6481 Anemia due to antineoplastic chemotherapy: Secondary | ICD-10-CM | POA: Diagnosis not present

## 2014-12-02 DIAGNOSIS — I10 Essential (primary) hypertension: Secondary | ICD-10-CM | POA: Diagnosis present

## 2014-12-02 DIAGNOSIS — I252 Old myocardial infarction: Secondary | ICD-10-CM

## 2014-12-02 DIAGNOSIS — E440 Moderate protein-calorie malnutrition: Secondary | ICD-10-CM | POA: Diagnosis not present

## 2014-12-02 DIAGNOSIS — I5032 Chronic diastolic (congestive) heart failure: Secondary | ICD-10-CM | POA: Diagnosis present

## 2014-12-02 DIAGNOSIS — R0602 Shortness of breath: Secondary | ICD-10-CM

## 2014-12-02 DIAGNOSIS — Z9484 Stem cells transplant status: Secondary | ICD-10-CM

## 2014-12-02 DIAGNOSIS — E039 Hypothyroidism, unspecified: Secondary | ICD-10-CM | POA: Diagnosis present

## 2014-12-02 DIAGNOSIS — K5900 Constipation, unspecified: Secondary | ICD-10-CM | POA: Diagnosis present

## 2014-12-02 DIAGNOSIS — M797 Fibromyalgia: Secondary | ICD-10-CM | POA: Diagnosis present

## 2014-12-02 DIAGNOSIS — I2510 Atherosclerotic heart disease of native coronary artery without angina pectoris: Secondary | ICD-10-CM | POA: Diagnosis present

## 2014-12-02 DIAGNOSIS — I248 Other forms of acute ischemic heart disease: Secondary | ICD-10-CM | POA: Diagnosis not present

## 2014-12-02 DIAGNOSIS — G629 Polyneuropathy, unspecified: Secondary | ICD-10-CM | POA: Diagnosis present

## 2014-12-02 DIAGNOSIS — J449 Chronic obstructive pulmonary disease, unspecified: Secondary | ICD-10-CM | POA: Diagnosis present

## 2014-12-02 DIAGNOSIS — I130 Hypertensive heart and chronic kidney disease with heart failure and stage 1 through stage 4 chronic kidney disease, or unspecified chronic kidney disease: Secondary | ICD-10-CM | POA: Diagnosis present

## 2014-12-02 DIAGNOSIS — Z9049 Acquired absence of other specified parts of digestive tract: Secondary | ICD-10-CM

## 2014-12-02 DIAGNOSIS — Z90710 Acquired absence of both cervix and uterus: Secondary | ICD-10-CM

## 2014-12-02 DIAGNOSIS — R112 Nausea with vomiting, unspecified: Secondary | ICD-10-CM

## 2014-12-02 HISTORY — DX: Deficiency of other specified B group vitamins: E53.8

## 2014-12-02 LAB — CBC WITH DIFFERENTIAL/PLATELET
BASOS PCT: 0 %
Basophils Absolute: 0 10*3/uL (ref 0.0–0.1)
EOS ABS: 0.4 10*3/uL (ref 0.0–0.7)
EOS PCT: 9 %
HCT: 24 % — ABNORMAL LOW (ref 36.0–46.0)
HEMOGLOBIN: 7.8 g/dL — AB (ref 12.0–15.0)
Lymphocytes Relative: 5 %
Lymphs Abs: 0.2 10*3/uL — ABNORMAL LOW (ref 0.7–4.0)
MCH: 34.7 pg — ABNORMAL HIGH (ref 26.0–34.0)
MCHC: 32.5 g/dL (ref 30.0–36.0)
MCV: 106.7 fL — ABNORMAL HIGH (ref 78.0–100.0)
MONO ABS: 0.9 10*3/uL (ref 0.1–1.0)
MONOS PCT: 20 %
NEUTROS PCT: 67 %
Neutro Abs: 3.2 10*3/uL (ref 1.7–7.7)
PLATELETS: 54 10*3/uL — AB (ref 150–400)
RBC: 2.25 MIL/uL — ABNORMAL LOW (ref 3.87–5.11)
RDW: 14.1 % (ref 11.5–15.5)
WBC: 4.8 10*3/uL (ref 4.0–10.5)

## 2014-12-02 LAB — I-STAT TROPONIN, ED: TROPONIN I, POC: 0.11 ng/mL — AB (ref 0.00–0.08)

## 2014-12-02 MED ORDER — SODIUM CHLORIDE 0.9 % IV BOLUS (SEPSIS)
1000.0000 mL | Freq: Once | INTRAVENOUS | Status: AC
  Administered 2014-12-03: 1000 mL via INTRAVENOUS

## 2014-12-02 MED ORDER — DIPHENHYDRAMINE HCL 50 MG/ML IJ SOLN
50.0000 mg | Freq: Once | INTRAMUSCULAR | Status: AC
  Administered 2014-12-03: 50 mg via INTRAVENOUS
  Filled 2014-12-02: qty 1

## 2014-12-02 MED ORDER — HYDROCORTISONE NA SUCCINATE PF 100 MG IJ SOLR: 200.0000 mg | Freq: Once | INTRAMUSCULAR | Status: DC

## 2014-12-02 NOTE — ED Notes (Signed)
Notified EDP,Oni,MD., pt. i-stat troponin results 0.11.

## 2014-12-02 NOTE — ED Provider Notes (Signed)
CSN: [id number]     Arrival date & time 12/02/14  2224 History   By signing my name below, I, Forrestine Him, attest that this documentation has been prepared under the direction and in the presence of Everlene Balls, MD.  Electronically Signed: Forrestine Him, ED Scribe. 12/02/2014. 11:43 PM.   Chief Complaint  Patient presents with  . Shortness of Breath   The history is provided by the patient. No language interpreter was used.    HPI Comments: Nancy Norris is a 74 y.o. female with a PMHx COPD, multiple myeloma, MI, and CKD, who presents to the Emergency Department complaining of constant, ongoing dizziness x 1 week. Dizziness is made worse with movement, standing, and activity. No alleviating factors at this time. Ongoing associated shortness of breath and weakness also reported. No recent medication changes. Pt denies any previous history of blood clots. Ms. Pepitone currently undergoes chemotherapy treatment twice a week for history of Multiple Myeloma.  PCP: [name], MD    Past Medical History  Diagnosis Date  . Hyperkalemia   . Hypothyroidism   . COPD (chronic obstructive pulmonary disease) ([place])   . Hyponatremia   . Dizziness   . Fibromyalgia   . Breast cancer ([place])   . Multiple myeloma   . Mucositis   . Hx of echocardiogram     a.  Echocardiogram (12/26/2012): EF 63-81%, grade 1 diastolic dysfunction;   b.  Echocardiogram (02/2013): EF 55-60%, no WMA, trivial effusion  . Fibromyalgia   . Arthritis   . Hx of cardiovascular stress test     LexiScan with low level exercise Myoview (02/2013): No ischemia, EF 72%; normal study  . Complication of anesthesia   . PONV (postoperative nausea and vomiting) 2008    after mastestomy  . Myocardial infarction [facility])     in past, patient was unaware.   . Anginal pain ([place])     used NTG x 2 May 31 and 06/15/13   . GERD (gastroesophageal reflux disease)   . Headache(784.0)   . Anxiety   . Depression   . CKD (chronic kidney  disease) stage 3, GFR 30-59 ml/min   . Anemia   . History of blood transfusion     last one May 12   . Neuropathy [facility])    Past Surgical History  Procedure Laterality Date  . History of port removal    . Status post stem cell transplant on September 28, 2008.    Marland Kitchen Abdominal hysterectomy  1981  . Cholecystectomy  1971  . Mastectomy Left 2008  . Cataract extraction, bilateral    . Breast surgery    . Eye surgery Bilateral     lens implant  . Breast reconstruction    . Portacath placement  12/2012    has had 2  . Av fistula placement Left 06/19/2013    Procedure: CREATION OF LEFT ARM ARTERIOVENOUS (AV) FISTULA ;  Surgeon: Angelia Mould, MD;  Location: [facility] OR;  Service: Vascular;  Laterality: Left;   Family History  Problem Relation Age of Onset  . Arthritis Mother   . Asthma Mother   . Cancer Sister   . Hyperlipidemia Brother    Social History  Substance Use Topics  . Smoking status: Former Smoker -- 1.00 packs/day for 30 years    Types: Cigarettes    Quit date: 02/15/2005  . Smokeless tobacco: Never Used  . Alcohol Use: No   OB History    No data available  Review of Systems   A complete 10 system review of systems was obtained and all systems are negative except as noted in the HPI and PMH.     Allergies  Codeine; Latex; Other; Onion; Zyprexa; Adhesive; Iodinated diagnostic agents; and Sulfa antibiotics  Home Medications   Prior to Admission medications   Medication Sig Start Date End Date Taking? Authorizing Provider  acetaminophen (TYLENOL) 325 MG tablet Take 650 mg by mouth every 6 (six) hours as needed for mild pain or moderate pain.     Historical Provider, MD  albuterol (PROVENTIL HFA;VENTOLIN HFA) 108 (90 BASE) MCG/ACT inhaler Inhale 2 puffs into the lungs 2 (two) times daily as needed for wheezing or shortness of breath. Patient not taking: Reported on 10/21/2014 06/04/13   [name], MD  ascorbic acid (VITAMIN C) 250 MG CHEW Chew 250 mg by  mouth daily.    Historical Provider, MD  aspirin EC 81 MG tablet Take 1 tablet (81 mg total) by mouth daily. 01/29/13   Liliane Shi, PA-C  buPROPion (WELLBUTRIN XL) 300 MG 24 hr tablet Take 300 mg by mouth daily.  11/03/13   Historical Provider, MD  carboxymethylcellulose (REFRESH PLUS) 0.5 % SOLN Place 1 drop into both eyes every 2 (two) hours.    Historical Provider, MD  Cholecalciferol 4000 UNITS TABS Take 4,000 Units by mouth daily.    Historical Provider, MD  citalopram (CELEXA) 40 MG tablet TAKE 1 TABLET BY MOUTH ONCE DAILY. 06/09/14   [name], MD  cromolyn (OPTICROM) 4 % ophthalmic solution Place 1 drop into both eyes 4 (four) times daily.    Historical Provider, MD  dexamethasone (DECADRON) 4 MG tablet Take 10 tablets by mouth once a week as directed 05/01/14   Adrena E Johnson, PA-C  esomeprazole (NEXIUM) 40 MG capsule TAKE 1 CAPSULE BY MOUTH DAILY, EACH MORNING FOR ACID INDIGESTION 03/26/14   [name], MD  Ferrous Sulfate 134 MG TABS Take 1 tablet (134 mg total) by mouth every other day. 06/09/14   [name], MD  fluticasone (FLONASE) 50 MCG/ACT nasal spray Place 2 sprays into both nostrils at bedtime.  03/17/13   Historical Provider, MD  Fluticasone-Salmeterol (ADVAIR) 100-50 MCG/DOSE AEPB Inhale 1 puff into the lungs 2 (two) times daily. 07/21/14   [name], MD  furosemide (LASIX) 40 MG tablet Take 1 tablet (40 mg total) by mouth daily. 11/11/14 11/11/15  [name], MD  gabapentin (NEURONTIN) 300 MG capsule TAKE 1 CAPSULE BY MOUTH AT BEDTIME. 07/21/14   Courtney Forcucci, PA-C  hydrOXYzine (ATARAX/VISTARIL) 25 MG tablet Take 1 tablet by mouth 3 (three) times daily as needed. anxiety 11/09/14   Historical Provider, MD  isosorbide mononitrate (IMDUR) 30 MG 24 hr tablet TAKE 1 TABLET BY MOUTH DAILY. 02/21/14   Josue Hector, MD  levothyroxine (SYNTHROID, LEVOTHROID) 150 MCG tablet TAKE 1 TABLET BY MOUTH ONCE DAILY. 06/09/14   [name], MD  loperamide (IMODIUM)  2 MG capsule Take two tabs po initially, then one tab after each loose stool: max 8 tabs in 24 hours Patient not taking: Reported on 10/21/2014 08/13/14   Loma Sousa Forcucci, PA-C  loratadine (CLARITIN) 10 MG tablet Take 10 mg by mouth daily.    Historical Provider, MD  LORazepam (ATIVAN) 0.5 MG tablet Take 1 tablet (0.5 mg total) by mouth every 8 (eight) hours as needed (nausea). 08/18/14   Curt Bears, MD  loteprednol (ALREX) 0.2 % SUSP Place 1 drop into both eyes 4 (four) times daily. 12/08/13  Vicie Mutters, PA-C  meclizine (ANTIVERT) 32 MG tablet Take 1 tablet (32 mg total) by mouth 3 (three) times daily as needed. 04/04/14   Verlee Monte, MD  midodrine (PROAMATINE) 10 MG tablet Take 1 tablet (10 mg total) by mouth 3 (three) times daily. 04/01/14   Josue Hector, MD  nitroGLYCERIN (NITROSTAT) 0.4 MG SL tablet Place 1 tablet (0.4 mg total) under the tongue every 5 (five) minutes as needed for chest pain. Patient not taking: Reported on 10/21/2014 01/29/13   Liliane Shi, PA-C  nystatin (MYCOSTATIN/NYSTOP) 100000 UNIT/GM POWD Apply 1 g topically 2 (two) times daily. Apply to belly    Historical Provider, MD  ondansetron (ZOFRAN-ODT) 8 MG disintegrating tablet Take 1 tablet (8 mg total) by mouth every 8 (eight) hours as needed for nausea or vomiting. Patient not taking: Reported on 10/21/2014 05/01/14   Adrena E Johnson, PA-C  pilocarpine (SALAGEN) 5 MG tablet TAKE 1 TABLET BY MOUTH 3 TIMES DAILY. 07/21/14   Curt Bears, MD  polyethylene glycol [facility] / GLYCOLAX) packet Take 17 g by mouth daily.    Historical Provider, MD  prochlorperazine (COMPAZINE) 10 MG tablet TAKE 1 TABLET BY MOUTH EVERY 6 HOURS AS NEEDED FOR NAUSEA OR VOMITING. Patient not taking: Reported on 08/23/2014 08/17/14   Curt Bears, MD  ranitidine (ZANTAC) 300 MG tablet TAKE 1 TABLET BY MOUTH AT BEDTIME, FOR ACID REFLUX AND INDIGESTION. 03/26/14   [name], MD  RESTASIS 0.05 % ophthalmic emulsion Place 1 drop into both  eyes 2 (two) times daily.  11/13/13   Historical Provider, MD   Triage Vitals: BP 111/52 mmHg  Pulse 78  Temp(Src) 98.3 F (36.8 C) (Oral)  Resp 20  SpO2 100%   Physical Exam  Constitutional: She is oriented to person, place, and time. She appears well-developed and well-nourished. No distress.  HENT:  Head: Normocephalic and atraumatic.  Nose: Nose normal.  Mouth/Throat: Oropharynx is clear and moist. No oropharyngeal exudate.  Eyes: Conjunctivae and EOM are normal. Pupils are equal, round, and reactive to light. No scleral icterus.  Neck: Normal range of motion. Neck supple. No JVD present. No tracheal deviation present. No thyromegaly present.  Cardiovascular: Normal rate, regular rhythm and normal heart sounds.  Exam reveals no gallop and no friction rub.   No murmur heard. Pulmonary/Chest: Effort normal and breath sounds normal. No respiratory distress. She has no wheezes. She exhibits no tenderness.  Abdominal: Soft. Bowel sounds are normal. She exhibits no distension and no mass. There is no tenderness. There is no rebound and no guarding.  Musculoskeletal: Normal range of motion. She exhibits edema. She exhibits no tenderness.  2 plus edema to lower extremities bilaterally   Lymphadenopathy:    She has no cervical adenopathy.  Neurological: She is alert and oriented to person, place, and time. No cranial nerve deficit. She exhibits normal muscle tone.  Skin: Skin is warm and dry. No rash noted. No erythema. No pallor.  Nursing note and vitals reviewed.   ED Course  Procedures (including critical care time)  DIAGNOSTIC STUDIES: Oxygen Saturation is 100% on RA, Normal by my interpretation.    COORDINATION OF CARE: 11:20 PM- Will give fluids. Will order CXR, i-stat troponin, CBC, i-stat chem 8, BMP, EKG, and urinalysis. Discussed treatment plan with pt at bedside and pt agreed to plan.     Labs Review Labs Reviewed  CBC WITH DIFFERENTIAL/PLATELET - Abnormal; Notable for  the following:    RBC 2.25 (*)  Hemoglobin 7.8 (*)    HCT 24.0 (*)    MCV 106.7 (*)    MCH 34.7 (*)    Platelets 54 (*)    Lymphs Abs 0.2 (*)    All other components within normal limits  BASIC METABOLIC PANEL - Abnormal; Notable for the following:    Chloride 113 (*)    CO2 18 (*)    Glucose, Bld 100 (*)    BUN 51 (*)    Creatinine, Ser 3.94 (*)    GFR calc non Af Amer 10 (*)    GFR calc Af Amer 12 (*)    All other components within normal limits  I-STAT TROPOININ, ED - Abnormal; Notable for the following:    Troponin i, poc 0.11 (*)    All other components within normal limits  URINALYSIS, ROUTINE W REFLEX MICROSCOPIC (NOT AT [facility])  I-STAT CHEM 8, ED  POC OCCULT BLOOD, ED    Imaging Review Dg Chest 2 View  12/02/2014  CLINICAL DATA:  Acute onset of shortness of breath and dizziness. Initial encounter. EXAM: CHEST  2 VIEW COMPARISON:  Chest radiograph performed 07/13/2014 FINDINGS: The lungs are well-aerated. Pulmonary vascularity is at the upper limits of normal. There is no evidence of focal opacification, pleural effusion or pneumothorax. The heart is borderline normal in size. A right-sided chest port is noted ending about the distal SVC. No acute osseous abnormalities are seen. A clip is noted overlying the left breast. IMPRESSION: No acute cardiopulmonary process seen. Electronically Signed   By: Garald Balding M.D.   On: 12/02/2014 23:16   I have personally reviewed and evaluated these images and lab results as part of my medical decision-making.   EKG Interpretation   Date/Time:  Thursday December 02 2014 22:37:01 EST Ventricular Rate:  79 PR Interval:  185 QRS Duration: 89 QT Interval:  391 QTC Calculation: 448 R Axis:   53 Text Interpretation:  Sinus rhythm Low voltage, precordial leads  Nonspecific T abnormalities, anterior leads new TWI V2-V3 Confirmed by  Glynn Octave [id number]) on 12/03/2014 12:20:52 AM      MDM   Final diagnoses:  None    Patient presents emergency department for exertional dizziness and shortness of breath. With her history of multiple myeloma, and concern for pulmonary and was in. She has an allergy to IV contrast, hydrocortisone was ordered for pretreatment. Troponin is elevated 0.11.  EKG shows new T-wave inversions in V2 and V3, again concerning for pulmonary and was him. Patient cannot get CT scan based on her creatinine level. She'll be admitted for further workup, V/Q scan, and eval for possible anticoagulation.  Hemoccult is negative.   I personally performed the services described in this documentation, which was scribed in my presence. The recorded information has been reviewed and is accurate.     Everlene Balls, MD 12/03/14 [phone]

## 2014-12-02 NOTE — ED Notes (Signed)
Registration at bedside.

## 2014-12-02 NOTE — ED Notes (Signed)
Pt complains of being short of breath and dizzy for one week, being treated for multiple myeloma and last chemo was Tuesday, she states that she gets dizzy walking across the parking lot and feels very weak all the time

## 2014-12-03 ENCOUNTER — Other Ambulatory Visit: Payer: Self-pay | Admitting: Medical Oncology

## 2014-12-03 ENCOUNTER — Inpatient Hospital Stay (HOSPITAL_COMMUNITY): Payer: Medicare Other

## 2014-12-03 ENCOUNTER — Other Ambulatory Visit: Payer: Self-pay

## 2014-12-03 DIAGNOSIS — Z90710 Acquired absence of both cervix and uterus: Secondary | ICD-10-CM | POA: Diagnosis not present

## 2014-12-03 DIAGNOSIS — D6481 Anemia due to antineoplastic chemotherapy: Secondary | ICD-10-CM | POA: Diagnosis present

## 2014-12-03 DIAGNOSIS — I5032 Chronic diastolic (congestive) heart failure: Secondary | ICD-10-CM | POA: Diagnosis not present

## 2014-12-03 DIAGNOSIS — R55 Syncope and collapse: Secondary | ICD-10-CM

## 2014-12-03 DIAGNOSIS — R112 Nausea with vomiting, unspecified: Secondary | ICD-10-CM | POA: Diagnosis not present

## 2014-12-03 DIAGNOSIS — J449 Chronic obstructive pulmonary disease, unspecified: Secondary | ICD-10-CM | POA: Diagnosis present

## 2014-12-03 DIAGNOSIS — R0602 Shortness of breath: Secondary | ICD-10-CM | POA: Diagnosis not present

## 2014-12-03 DIAGNOSIS — I252 Old myocardial infarction: Secondary | ICD-10-CM | POA: Diagnosis not present

## 2014-12-03 DIAGNOSIS — C9000 Multiple myeloma not having achieved remission: Secondary | ICD-10-CM | POA: Diagnosis not present

## 2014-12-03 DIAGNOSIS — E039 Hypothyroidism, unspecified: Secondary | ICD-10-CM | POA: Diagnosis not present

## 2014-12-03 DIAGNOSIS — I509 Heart failure, unspecified: Secondary | ICD-10-CM

## 2014-12-03 DIAGNOSIS — Z87891 Personal history of nicotine dependence: Secondary | ICD-10-CM | POA: Diagnosis not present

## 2014-12-03 DIAGNOSIS — E538 Deficiency of other specified B group vitamins: Secondary | ICD-10-CM | POA: Diagnosis not present

## 2014-12-03 DIAGNOSIS — K5900 Constipation, unspecified: Secondary | ICD-10-CM | POA: Diagnosis present

## 2014-12-03 DIAGNOSIS — E872 Acidosis: Secondary | ICD-10-CM | POA: Diagnosis present

## 2014-12-03 DIAGNOSIS — T451X5A Adverse effect of antineoplastic and immunosuppressive drugs, initial encounter: Secondary | ICD-10-CM | POA: Diagnosis present

## 2014-12-03 DIAGNOSIS — G629 Polyneuropathy, unspecified: Secondary | ICD-10-CM | POA: Diagnosis present

## 2014-12-03 DIAGNOSIS — Z9049 Acquired absence of other specified parts of digestive tract: Secondary | ICD-10-CM | POA: Diagnosis not present

## 2014-12-03 DIAGNOSIS — R42 Dizziness and giddiness: Secondary | ICD-10-CM

## 2014-12-03 DIAGNOSIS — E440 Moderate protein-calorie malnutrition: Secondary | ICD-10-CM | POA: Diagnosis present

## 2014-12-03 DIAGNOSIS — I248 Other forms of acute ischemic heart disease: Secondary | ICD-10-CM | POA: Diagnosis present

## 2014-12-03 DIAGNOSIS — I10 Essential (primary) hypertension: Secondary | ICD-10-CM | POA: Diagnosis not present

## 2014-12-03 DIAGNOSIS — Z9484 Stem cells transplant status: Secondary | ICD-10-CM | POA: Diagnosis not present

## 2014-12-03 DIAGNOSIS — N184 Chronic kidney disease, stage 4 (severe): Secondary | ICD-10-CM | POA: Diagnosis present

## 2014-12-03 DIAGNOSIS — M797 Fibromyalgia: Secondary | ICD-10-CM | POA: Diagnosis present

## 2014-12-03 DIAGNOSIS — I130 Hypertensive heart and chronic kidney disease with heart failure and stage 1 through stage 4 chronic kidney disease, or unspecified chronic kidney disease: Secondary | ICD-10-CM | POA: Diagnosis present

## 2014-12-03 DIAGNOSIS — D649 Anemia, unspecified: Secondary | ICD-10-CM | POA: Diagnosis not present

## 2014-12-03 DIAGNOSIS — I2510 Atherosclerotic heart disease of native coronary artery without angina pectoris: Secondary | ICD-10-CM | POA: Diagnosis present

## 2014-12-03 DIAGNOSIS — Z853 Personal history of malignant neoplasm of breast: Secondary | ICD-10-CM | POA: Diagnosis not present

## 2014-12-03 DIAGNOSIS — K219 Gastro-esophageal reflux disease without esophagitis: Secondary | ICD-10-CM | POA: Diagnosis not present

## 2014-12-03 LAB — IRON AND TIBC
IRON: 177 ug/dL — AB (ref 28–170)
SATURATION RATIOS: 86 % — AB (ref 10.4–31.8)
TIBC: 206 ug/dL — AB (ref 250–450)
UIBC: 29 ug/dL

## 2014-12-03 LAB — URINALYSIS, ROUTINE W REFLEX MICROSCOPIC
Bilirubin Urine: NEGATIVE
GLUCOSE, UA: 100 mg/dL — AB
Ketones, ur: NEGATIVE mg/dL
Leukocytes, UA: NEGATIVE
Nitrite: NEGATIVE
Protein, ur: 100 mg/dL — AB
SPECIFIC GRAVITY, URINE: 1.011 (ref 1.005–1.030)
pH: 6 (ref 5.0–8.0)

## 2014-12-03 LAB — COMPREHENSIVE METABOLIC PANEL
ALT: 27 U/L (ref 14–54)
AST: 13 U/L — AB (ref 15–41)
Albumin: 3.4 g/dL — ABNORMAL LOW (ref 3.5–5.0)
Alkaline Phosphatase: 70 U/L (ref 38–126)
Anion gap: 6 (ref 5–15)
BILIRUBIN TOTAL: 1.3 mg/dL — AB (ref 0.3–1.2)
BUN: 49 mg/dL — AB (ref 6–20)
CALCIUM: 8.2 mg/dL — AB (ref 8.9–10.3)
CO2: 19 mmol/L — ABNORMAL LOW (ref 22–32)
CREATININE: 3.76 mg/dL — AB (ref 0.44–1.00)
Chloride: 113 mmol/L — ABNORMAL HIGH (ref 101–111)
GFR, EST AFRICAN AMERICAN: 13 mL/min — AB (ref >60)
GFR, EST NON AFRICAN AMERICAN: 11 mL/min — AB (ref >60)
Glucose, Bld: 94 mg/dL (ref 65–99)
Potassium: 3.5 mmol/L (ref 3.5–5.1)
Sodium: 138 mmol/L (ref 135–145)
TOTAL PROTEIN: 5.3 g/dL — AB (ref 6.5–8.1)

## 2014-12-03 LAB — URINE MICROSCOPIC-ADD ON: RBC / HPF: NONE SEEN RBC/hpf (ref 0–5)

## 2014-12-03 LAB — GLUCOSE, CAPILLARY: Glucose-Capillary: 82 mg/dL (ref 65–99)

## 2014-12-03 LAB — BASIC METABOLIC PANEL
Anion gap: 9 (ref 5–15)
BUN: 51 mg/dL — ABNORMAL HIGH (ref 6–20)
CALCIUM: 8.9 mg/dL (ref 8.9–10.3)
CO2: 18 mmol/L — ABNORMAL LOW (ref 22–32)
CREATININE: 3.94 mg/dL — AB (ref 0.44–1.00)
Chloride: 113 mmol/L — ABNORMAL HIGH (ref 101–111)
GFR, EST AFRICAN AMERICAN: 12 mL/min — AB (ref >60)
GFR, EST NON AFRICAN AMERICAN: 10 mL/min — AB (ref >60)
Glucose, Bld: 100 mg/dL — ABNORMAL HIGH (ref 65–99)
Potassium: 3.8 mmol/L (ref 3.5–5.1)
SODIUM: 140 mmol/L (ref 135–145)

## 2014-12-03 LAB — CBC
HEMATOCRIT: 21.6 % — AB (ref 36.0–46.0)
Hemoglobin: 6.9 g/dL — CL (ref 12.0–15.0)
MCH: 34.2 pg — AB (ref 26.0–34.0)
MCHC: 31.9 g/dL (ref 30.0–36.0)
MCV: 106.9 fL — AB (ref 78.0–100.0)
PLATELETS: 41 10*3/uL — AB (ref 150–400)
RBC: 2.02 MIL/uL — AB (ref 3.87–5.11)
RDW: 14.1 % (ref 11.5–15.5)
WBC: 3.9 10*3/uL — AB (ref 4.0–10.5)

## 2014-12-03 LAB — POC OCCULT BLOOD, ED: Fecal Occult Bld: NEGATIVE

## 2014-12-03 LAB — FERRITIN: FERRITIN: 1224 ng/mL — AB (ref 11–307)

## 2014-12-03 LAB — TSH: TSH: 0.123 u[IU]/mL — AB (ref 0.350–4.500)

## 2014-12-03 LAB — TROPONIN I
TROPONIN I: 0.07 ng/mL — AB (ref <0.031)
Troponin I: 0.06 ng/mL — ABNORMAL HIGH (ref <0.031)
Troponin I: 0.04 ng/mL — ABNORMAL HIGH (ref <0.031)

## 2014-12-03 LAB — VITAMIN B12: Vitamin B-12: 165 pg/mL — ABNORMAL LOW (ref 180–914)

## 2014-12-03 LAB — PREPARE RBC (CROSSMATCH)

## 2014-12-03 MED ORDER — MECLIZINE HCL 25 MG PO TABS
32.0000 mg | ORAL_TABLET | Freq: Three times a day (TID) | ORAL | Status: DC | PRN
  Filled 2014-12-03: qty 0.5

## 2014-12-03 MED ORDER — CITALOPRAM HYDROBROMIDE 20 MG PO TABS
40.0000 mg | ORAL_TABLET | Freq: Every day | ORAL | Status: DC
  Administered 2014-12-03 – 2014-12-07 (×5): 40 mg via ORAL
  Filled 2014-12-03 (×5): qty 2

## 2014-12-03 MED ORDER — PANTOPRAZOLE SODIUM 40 MG PO TBEC
40.0000 mg | DELAYED_RELEASE_TABLET | Freq: Every day | ORAL | Status: DC
  Administered 2014-12-03 – 2014-12-07 (×5): 40 mg via ORAL
  Filled 2014-12-03 (×5): qty 1

## 2014-12-03 MED ORDER — METOCLOPRAMIDE HCL 5 MG/ML IJ SOLN
10.0000 mg | Freq: Once | INTRAMUSCULAR | Status: AC
Start: 2014-12-03 — End: 2014-12-03
  Administered 2014-12-03: 10 mg via INTRAVENOUS
  Filled 2014-12-03: qty 2

## 2014-12-03 MED ORDER — TECHNETIUM TC 99M DIETHYLENETRIAME-PENTAACETIC ACID: 32.9000 | Freq: Once | INTRAVENOUS | Status: DC | PRN

## 2014-12-03 MED ORDER — LOTEPREDNOL ETABONATE 0.5 % OP SUSP
1.0000 [drp] | Freq: Four times a day (QID) | OPHTHALMIC | Status: DC
  Administered 2014-12-03 – 2014-12-07 (×13): 1 [drp] via OPHTHALMIC
  Filled 2014-12-03: qty 5

## 2014-12-03 MED ORDER — VITAMIN B-1 100 MG PO TABS
100.0000 mg | ORAL_TABLET | Freq: Every day | ORAL | Status: DC
  Administered 2014-12-03 – 2014-12-07 (×5): 100 mg via ORAL
  Filled 2014-12-03 (×5): qty 1

## 2014-12-03 MED ORDER — SODIUM CHLORIDE 0.9 % IJ SOLN
3.0000 mL | Freq: Two times a day (BID) | INTRAMUSCULAR | Status: DC
  Administered 2014-12-06: 3 mL via INTRAVENOUS

## 2014-12-03 MED ORDER — FUROSEMIDE 10 MG/ML IJ SOLN
20.0000 mg | Freq: Once | INTRAMUSCULAR | Status: AC
  Administered 2014-12-03: 20 mg via INTRAVENOUS
  Filled 2014-12-03: qty 2

## 2014-12-03 MED ORDER — ACETAMINOPHEN 325 MG PO TABS
650.0000 mg | ORAL_TABLET | Freq: Once | ORAL | Status: AC
  Administered 2014-12-03: 650 mg via ORAL
  Filled 2014-12-03: qty 2

## 2014-12-03 MED ORDER — DIPHENHYDRAMINE HCL 25 MG PO CAPS
25.0000 mg | ORAL_CAPSULE | Freq: Once | ORAL | Status: AC
Start: 2014-12-03 — End: 2014-12-03
  Administered 2014-12-03: 25 mg via ORAL
  Filled 2014-12-03: qty 1

## 2014-12-03 MED ORDER — CYCLOSPORINE 0.05 % OP EMUL
1.0000 [drp] | Freq: Two times a day (BID) | OPHTHALMIC | Status: DC
  Administered 2014-12-03 – 2014-12-06 (×9): 1 [drp] via OPHTHALMIC
  Filled 2014-12-03 (×11): qty 1

## 2014-12-03 MED ORDER — FERROUS SULFATE 325 (65 FE) MG PO TABS
325.0000 mg | ORAL_TABLET | ORAL | Status: DC
  Administered 2014-12-03 – 2014-12-05 (×2): 325 mg via ORAL
  Filled 2014-12-03 (×3): qty 1

## 2014-12-03 MED ORDER — PILOCARPINE HCL 5 MG PO TABS
5.0000 mg | ORAL_TABLET | Freq: Three times a day (TID) | ORAL | Status: DC
  Administered 2014-12-03 – 2014-12-07 (×7): 5 mg via ORAL
  Filled 2014-12-03 (×15): qty 1

## 2014-12-03 MED ORDER — ONDANSETRON HCL 4 MG PO TABS: 4.0000 mg | ORAL_TABLET | Freq: Four times a day (QID) | ORAL | Status: DC | PRN

## 2014-12-03 MED ORDER — SODIUM CHLORIDE 0.9 % IV SOLN: Freq: Once | INTRAVENOUS | Status: DC

## 2014-12-03 MED ORDER — SODIUM CHLORIDE 0.9 % IJ SOLN
10.0000 mL | Freq: Two times a day (BID) | INTRAMUSCULAR | Status: DC
  Administered 2014-12-07: 10 mL

## 2014-12-03 MED ORDER — LORAZEPAM 0.5 MG PO TABS
0.5000 mg | ORAL_TABLET | Freq: Three times a day (TID) | ORAL | Status: DC | PRN
  Administered 2014-12-04 – 2014-12-05 (×4): 0.5 mg via ORAL
  Filled 2014-12-03 (×4): qty 1

## 2014-12-03 MED ORDER — LEVOTHYROXINE SODIUM 50 MCG PO TABS
150.0000 ug | ORAL_TABLET | Freq: Every day | ORAL | Status: DC
  Administered 2014-12-03 – 2014-12-04 (×2): 150 ug via ORAL
  Filled 2014-12-03 (×2): qty 1

## 2014-12-03 MED ORDER — ACETAMINOPHEN 325 MG PO TABS
650.0000 mg | ORAL_TABLET | Freq: Four times a day (QID) | ORAL | Status: DC | PRN
  Administered 2014-12-07: 650 mg via ORAL
  Filled 2014-12-03: qty 2

## 2014-12-03 MED ORDER — ONDANSETRON HCL 4 MG/2ML IJ SOLN
4.0000 mg | Freq: Four times a day (QID) | INTRAMUSCULAR | Status: DC | PRN
  Administered 2014-12-06: 4 mg via INTRAVENOUS
  Filled 2014-12-03: qty 2

## 2014-12-03 MED ORDER — TRAMADOL HCL 50 MG PO TABS
50.0000 mg | ORAL_TABLET | Freq: Four times a day (QID) | ORAL | Status: DC | PRN
  Administered 2014-12-03 – 2014-12-04 (×2): 50 mg via ORAL
  Filled 2014-12-03 (×2): qty 1

## 2014-12-03 MED ORDER — NYSTATIN 100000 UNIT/GM EX POWD
1.0000 g | Freq: Two times a day (BID) | CUTANEOUS | Status: DC
  Filled 2014-12-03: qty 15

## 2014-12-03 MED ORDER — VITAMIN C 500 MG PO TABS
250.0000 mg | ORAL_TABLET | Freq: Every day | ORAL | Status: DC
  Administered 2014-12-03 – 2014-12-07 (×5): 250 mg via ORAL
  Filled 2014-12-03 (×5): qty 1

## 2014-12-03 MED ORDER — CROMOLYN SODIUM 4 % OP SOLN
1.0000 [drp] | Freq: Four times a day (QID) | OPHTHALMIC | Status: DC
  Administered 2014-12-03 – 2014-12-07 (×13): 1 [drp] via OPHTHALMIC
  Filled 2014-12-03: qty 10

## 2014-12-03 MED ORDER — SODIUM CHLORIDE 0.9 % IJ SOLN
10.0000 mL | INTRAMUSCULAR | Status: DC | PRN
  Administered 2014-12-03 – 2014-12-07 (×4): 10 mL
  Filled 2014-12-03 (×4): qty 40

## 2014-12-03 MED ORDER — ISOSORBIDE MONONITRATE ER 30 MG PO TB24
30.0000 mg | ORAL_TABLET | Freq: Every day | ORAL | Status: DC
  Administered 2014-12-03 – 2014-12-07 (×5): 30 mg via ORAL
  Filled 2014-12-03 (×5): qty 1

## 2014-12-03 MED ORDER — FOLIC ACID 1 MG PO TABS
1.0000 mg | ORAL_TABLET | Freq: Every day | ORAL | Status: DC
  Administered 2014-12-03 – 2014-12-07 (×5): 1 mg via ORAL
  Filled 2014-12-03 (×5): qty 1

## 2014-12-03 MED ORDER — SODIUM CHLORIDE 0.9 % IV SOLN
INTRAVENOUS | Status: DC
  Administered 2014-12-03 – 2014-12-04 (×3): via INTRAVENOUS

## 2014-12-03 MED ORDER — ENOXAPARIN SODIUM 80 MG/0.8ML ~~LOC~~ SOLN
70.0000 mg | Freq: Every day | SUBCUTANEOUS | Status: DC
  Administered 2014-12-03: 70 mg via SUBCUTANEOUS
  Filled 2014-12-03: qty 0.8

## 2014-12-03 MED ORDER — BUPROPION HCL ER (XL) 300 MG PO TB24
300.0000 mg | ORAL_TABLET | Freq: Every day | ORAL | Status: DC
  Administered 2014-12-03 – 2014-12-07 (×5): 300 mg via ORAL
  Filled 2014-12-03 (×5): qty 1

## 2014-12-03 MED ORDER — MOMETASONE FURO-FORMOTEROL FUM 100-5 MCG/ACT IN AERO
2.0000 | INHALATION_SPRAY | Freq: Two times a day (BID) | RESPIRATORY_TRACT | Status: DC
  Administered 2014-12-03 – 2014-12-06 (×7): 2 via RESPIRATORY_TRACT
  Filled 2014-12-03 (×2): qty 8.8

## 2014-12-03 MED ORDER — ASPIRIN EC 81 MG PO TBEC
81.0000 mg | DELAYED_RELEASE_TABLET | Freq: Every day | ORAL | Status: DC
  Administered 2014-12-03 – 2014-12-04 (×2): 81 mg via ORAL
  Filled 2014-12-03 (×2): qty 1

## 2014-12-03 MED ORDER — ALBUTEROL SULFATE (2.5 MG/3ML) 0.083% IN NEBU: 3.0000 mL | INHALATION_SOLUTION | Freq: Two times a day (BID) | RESPIRATORY_TRACT | Status: DC | PRN

## 2014-12-03 MED ORDER — VITAMIN D 1000 UNITS PO TABS
4000.0000 [IU] | ORAL_TABLET | Freq: Every day | ORAL | Status: DC
  Administered 2014-12-03 – 2014-12-07 (×5): 4000 [IU] via ORAL
  Filled 2014-12-03 (×5): qty 4

## 2014-12-03 MED ORDER — FAMOTIDINE 20 MG PO TABS
10.0000 mg | ORAL_TABLET | Freq: Every day | ORAL | Status: DC
  Administered 2014-12-03 – 2014-12-07 (×5): 10 mg via ORAL
  Filled 2014-12-03 (×5): qty 1

## 2014-12-03 MED ORDER — MIDODRINE HCL 5 MG PO TABS
10.0000 mg | ORAL_TABLET | Freq: Three times a day (TID) | ORAL | Status: DC
  Administered 2014-12-03 – 2014-12-07 (×12): 10 mg via ORAL
  Filled 2014-12-03 (×15): qty 2

## 2014-12-03 MED ORDER — NITROGLYCERIN 0.4 MG SL SUBL: 0.4000 mg | SUBLINGUAL_TABLET | SUBLINGUAL | Status: DC | PRN

## 2014-12-03 MED ORDER — ACETAMINOPHEN 650 MG RE SUPP: 650.0000 mg | Freq: Four times a day (QID) | RECTAL | Status: DC | PRN

## 2014-12-03 MED ORDER — GABAPENTIN 300 MG PO CAPS
300.0000 mg | ORAL_CAPSULE | Freq: Every day | ORAL | Status: DC
  Administered 2014-12-03 – 2014-12-06 (×4): 300 mg via ORAL
  Filled 2014-12-03 (×4): qty 1

## 2014-12-03 MED ORDER — CYANOCOBALAMIN 1000 MCG/ML IJ SOLN
1000.0000 ug | Freq: Every day | INTRAMUSCULAR | Status: DC
  Administered 2014-12-03 – 2014-12-07 (×5): 1000 ug via INTRAMUSCULAR
  Filled 2014-12-03 (×6): qty 1

## 2014-12-03 MED ORDER — TECHNETIUM TO 99M ALBUMIN AGGREGATED
4.2000 | Freq: Once | INTRAVENOUS | Status: AC | PRN
  Administered 2014-12-03: 4.2 via INTRAVENOUS

## 2014-12-03 MED ORDER — ADULT MULTIVITAMIN W/MINERALS CH
1.0000 | ORAL_TABLET | Freq: Every day | ORAL | Status: DC
  Administered 2014-12-03 – 2014-12-07 (×5): 1 via ORAL
  Filled 2014-12-03 (×5): qty 1

## 2014-12-03 NOTE — Progress Notes (Signed)
ANTICOAGULATION CONSULT NOTE - Initial Consult  Pharmacy Consult for Lovenox Indication: R/o PE  Allergies  Allergen Reactions  . Codeine Anaphylaxis  . Latex Shortness Of Breath    Adhesive products   . Other Other (See Comments)    Onion, chocolate causes migraines  . Onion Other (See Comments)    Causes migraine headaches  . Zyprexa [Olanzapine] Other (See Comments)    Confusion , dizzy,unsteady  . Adhesive [Tape] Other (See Comments)    blisters  . Iodinated Diagnostic Agents Itching    Happened 60 years ago  . Sulfa Antibiotics Itching    Patient Measurements: Height: 5' 5.5" (166.4 cm) Weight: 160 lb (72.576 kg) IBW/kg (Calculated) : 58.15   Vital Signs: Temp: 98 F (36.7 C) (11/18 0210) Temp Source: Oral (11/18 0210) BP: 114/54 mmHg (11/18 0210) Pulse Rate: 82 (11/18 0210)  Labs:  Recent Labs  12/02/14 2315  HGB 7.8*  HCT 24.0*  PLT 54*  CREATININE 3.94*    Estimated Creatinine Clearance: 12.8 mL/min (by C-G formula based on Cr of 3.94).   Medical History: Past Medical History  Diagnosis Date  . Hyperkalemia   . Hypothyroidism   . COPD (chronic obstructive pulmonary disease) ([place])   . Hyponatremia   . Dizziness   . Fibromyalgia   . Breast cancer ([place])   . Multiple myeloma   . Mucositis   . Hx of echocardiogram     a.  Echocardiogram (12/26/2012): EF 40-98%, grade 1 diastolic dysfunction;   b.  Echocardiogram (02/2013): EF 55-60%, no WMA, trivial effusion  . Fibromyalgia   . Arthritis   . Hx of cardiovascular stress test     LexiScan with low level exercise Myoview (02/2013): No ischemia, EF 72%; normal study  . Complication of anesthesia   . PONV (postoperative nausea and vomiting) 2008    after mastestomy  . Myocardial infarction [facility])     in past, patient was unaware.   . Anginal pain ([place])     used NTG x 2 May 31 and 06/15/13   . GERD (gastroesophageal reflux disease)   . Headache(784.0)   . Anxiety   . Depression   . CKD (chronic  kidney disease) stage 3, GFR 30-59 ml/min   . Anemia   . History of blood transfusion     last one May 12   . Neuropathy ([facility])     Medications:  Prescriptions prior to admission  Medication Sig Dispense Refill Last Dose  . acetaminophen (TYLENOL) 325 MG tablet Take 650 mg by mouth every 6 (six) hours as needed for mild pain or moderate pain.    Past Month at Unknown time  . albuterol (PROVENTIL HFA;VENTOLIN HFA) 108 (90 BASE) MCG/ACT inhaler Inhale 2 puffs into the lungs 2 (two) times daily as needed for wheezing or shortness of breath. 1 each 6 12/03/2014 at Unknown time  . ascorbic acid (VITAMIN C) 250 MG CHEW Chew 250 mg by mouth daily.   12/02/2014 at Unknown time  . aspirin EC 81 MG tablet Take 1 tablet (81 mg total) by mouth daily.   12/02/2014 at Unknown time  . buPROPion (WELLBUTRIN XL) 300 MG 24 hr tablet Take 300 mg by mouth daily.   98 12/02/2014 at Unknown time  . carboxymethylcellulose (REFRESH PLUS) 0.5 % SOLN Place 1 drop into both eyes every 2 (two) hours.   12/02/2014 at Unknown time  . Cholecalciferol 4000 UNITS TABS Take 4,000 Units by mouth daily.   12/02/2014 at Unknown time  .  citalopram (CELEXA) 40 MG tablet TAKE 1 TABLET BY MOUTH ONCE DAILY. 90 tablet 1 12/02/2014 at Unknown time  . cromolyn (OPTICROM) 4 % ophthalmic solution Place 1 drop into both eyes 4 (four) times daily.   12/02/2014 at Unknown time  . esomeprazole (NEXIUM) 40 MG capsule TAKE 1 CAPSULE BY MOUTH DAILY, EACH MORNING FOR ACID INDIGESTION 30 capsule PRN 12/02/2014 at Unknown time  . Ferrous Sulfate 134 MG TABS Take 1 tablet (134 mg total) by mouth every other day. 60 tablet 2 Past Week at Unknown time  . Fluticasone-Salmeterol (ADVAIR) 100-50 MCG/DOSE AEPB Inhale 1 puff into the lungs 2 (two) times daily. 60 each 3 Past Month at Unknown time  . gabapentin (NEURONTIN) 300 MG capsule TAKE 1 CAPSULE BY MOUTH AT BEDTIME. 90 capsule 3 Past Week at Unknown time  . isosorbide mononitrate (IMDUR) 30 MG 24 hr  tablet TAKE 1 TABLET BY MOUTH DAILY. 30 tablet 11 12/02/2014 at Unknown time  . levothyroxine (SYNTHROID, LEVOTHROID) 150 MCG tablet TAKE 1 TABLET BY MOUTH ONCE DAILY. 90 tablet 1 12/02/2014 at Unknown time  . loratadine (CLARITIN) 10 MG tablet Take 10 mg by mouth daily.   12/02/2014 at Unknown time  . LORazepam (ATIVAN) 0.5 MG tablet Take 1 tablet (0.5 mg total) by mouth every 8 (eight) hours as needed (nausea). 30 tablet 0 Past Month at Unknown time  . loteprednol (ALREX) 0.2 % SUSP Place 1 drop into both eyes 4 (four) times daily. 1 Bottle PRN 12/02/2014 at Unknown time  . meclizine (ANTIVERT) 32 MG tablet Take 1 tablet (32 mg total) by mouth 3 (three) times daily as needed. 30 tablet 0 Past Week at Unknown time  . midodrine (PROAMATINE) 10 MG tablet Take 1 tablet (10 mg total) by mouth 3 (three) times daily. 90 tablet 11 12/02/2014 at Unknown time  . ondansetron (ZOFRAN-ODT) 8 MG disintegrating tablet Take 1 tablet (8 mg total) by mouth every 8 (eight) hours as needed for nausea or vomiting. 20 tablet 3 12/02/2014 at Unknown time  . pilocarpine (SALAGEN) 5 MG tablet TAKE 1 TABLET BY MOUTH 3 TIMES DAILY. 90 tablet 0 12/02/2014 at Unknown time  . prochlorperazine (COMPAZINE) 10 MG tablet TAKE 1 TABLET BY MOUTH EVERY 6 HOURS AS NEEDED FOR NAUSEA OR VOMITING. 60 tablet 0 Past Week at Unknown time  . ranitidine (ZANTAC) 300 MG tablet TAKE 1 TABLET BY MOUTH AT BEDTIME, FOR ACID REFLUX AND INDIGESTION. 30 tablet PRN Past Week at Unknown time  . RESTASIS 0.05 % ophthalmic emulsion Place 1 drop into both eyes 2 (two) times daily.   4 12/02/2014 at Unknown time  . senna (SENOKOT) 8.6 MG tablet Take 1 tablet by mouth at bedtime.   Past Week at Unknown time  . dexamethasone (DECADRON) 4 MG tablet Take 10 tablets by mouth once a week as directed (Patient taking differently: Take 40 mg by mouth once a week. Only takes on Mondays when she has chemo) 40 tablet 3 11/29/2014  . furosemide (LASIX) 40 MG tablet Take 1  tablet (40 mg total) by mouth daily. (Patient not taking: Reported on 12/03/2014) 90 tablet 1 Not Taking at Unknown time  . hydrOXYzine (ATARAX/VISTARIL) 25 MG tablet Take 1 tablet by mouth 3 (three) times daily as needed for vomiting. anxiety  1   . loperamide (IMODIUM) 2 MG capsule Take two tabs po initially, then one tab after each loose stool: max 8 tabs in 24 hours (Patient not taking: Reported on 10/21/2014) 30 capsule 0  Not Taking  . nitroGLYCERIN (NITROSTAT) 0.4 MG SL tablet Place 1 tablet (0.4 mg total) under the tongue every 5 (five) minutes as needed for chest pain. 25 tablet 3 Not Taking  . nystatin (MYCOSTATIN/NYSTOP) 100000 UNIT/GM POWD Apply 1 g topically 2 (two) times daily. Apply to belly   Completed Course at Unknown time   Scheduled:  . sodium chloride   Intravenous Once  . aspirin EC  81 mg Oral Daily  . buPROPion  300 mg Oral Daily  . cholecalciferol  4,000 Units Oral Daily  . citalopram  40 mg Oral Daily  . cromolyn  1 drop Both Eyes QID  . cycloSPORINE  1 drop Both Eyes BID  . enoxaparin (LOVENOX) injection  70 mg Subcutaneous QHS  . famotidine  10 mg Oral Daily  . ferrous sulfate  325 mg Oral QODAY  . folic acid  1 mg Oral Daily  . gabapentin  300 mg Oral QHS  . isosorbide mononitrate  30 mg Oral Daily  . levothyroxine  150 mcg Oral Daily  . loteprednol  1 drop Both Eyes QID  . midodrine  10 mg Oral TID  . mometasone-formoterol  2 puff Inhalation BID  . multivitamin with minerals  1 tablet Oral Daily  . nystatin  1 g Topical BID  . pantoprazole  40 mg Oral Daily  . pilocarpine  5 mg Oral TID  . sodium chloride  10-40 mL Intracatheter Q12H  . sodium chloride  3 mL Intravenous Q12H  . thiamine  100 mg Oral Daily  . vitamin C  250 mg Oral Daily   Infusions:  . sodium chloride 50 mL/hr at 12/03/14 2993    Assessment: 77 yoF c/o dizziness, some concern for PE but unable to get CT scan or VQ scan now.  Hgb=7.8 and Plts=54.  Lovenox per Rx for r/o PE.  Will f/u  VQ scan and d/c if negative for PE.  Goal of Therapy:  Anti-Xa level 0.6-1 units/ml 4hrs after LMWH dose given    Plan:   Lovenox 81m q24h  F/u VQ scan/Hgb/Plts   GLawana PaiR 12/03/2014,3:59 AM

## 2014-12-03 NOTE — Progress Notes (Signed)
I have seen and assessed patient and agree with Dr.Khan assessment and plan. Patient presenting with SOB/ dizziness. Patient s/p V/Q scan which was negative for PE. D/C full dose lovenox. B12 IM. Transfuse 2 units PRBCs. Follow hh.

## 2014-12-03 NOTE — Progress Notes (Signed)
Initial Nutrition Assessment   DOCUMENTATION CODES:   Non-severe (moderate) malnutrition in context of chronic illness  INTERVENTION:  - RD will continue to monitor for needs  NUTRITION DIAGNOSIS:   Increased nutrient needs related to catabolic illness, cancer and cancer related treatments as evidenced by estimated needs.  GOAL:   Patient will meet greater than or equal to 90% of their needs  MONITOR:   PO intake, Weight trends, Labs, I & O's  REASON FOR ASSESSMENT:   Malnutrition Screening Tool  ASSESSMENT:   74 y.o. female with history of Hypothyroid COPD Dizziness Multiple Myeloma Breast Cancer presented to the ED with dizziness. Patient has also been having increased shortness of breath noted. All these symptoms have been going on for about a week. Patient seems to be more dizzy when she moves around. Patient has been having more shortness of breath when she walks. She states that when she gets up she feels weak. She has no chest pain noted but does have pain in the area of her hiatal hernia. She has not actually passed out. She has no recent travel. Patient does have a history of multiple myeloma and has been undergoing chemotherapy twice a week for this. She had a blood count checked on Monday which was 9.6 and now her Hgb is down to 7.8. She has not noted any blood in her stool. She states that her stools are normal colored. Patient does have a history of constipation which does make her stools hard and dark. Patient states that she has noted no fevers or chills. She has had noted nausea and no vomiting.  Pt seen for MST. BMI indicates overweight status. Diet now Renal with 1200cc fluid restriction. Pt states she plans to order broth for lunch today. She has blisters in mouth and edges of lips which she states appeared yesterday; pt has not had blisters other than one time in the past following stem cell transplant.   She states that she has had a fair appetite for a longer than  she can recall. She states that she has taste alteration associated with chemo: bland taste and metallic taste. She also indicates difficulty swallowing which has been ongoing x1 year with slight decline over that time. She swallows liquids well but has some difficulty with meat, bread, and crackers. She states that if she toasts bread she is able to tolerate it better.    PTA pt was not drinking nutrition supplements. No muscle or fat wasting noted. Per chart review, pt has lost 21 lbs (12% body weight) in the past 5 months which is significant for time frame.  Not meeting needs at this time. Will monitor for need for supplements. Medications reviewed. Labs reviewed; Cl: 113 mmol/L, BUN/creatinine elevated, Ca: 8.2 mg/dL, GFR: 11.   Diet Order:  Diet renal with fluid restriction Fluid restriction:: 1200 mL Fluid; Room service appropriate?: Yes; Fluid consistency:: Thin  Skin:  Reviewed, no issues  Last BM:  11/17  Height:   Ht Readings from Last 1 Encounters:  12/03/14 5' 5.5" (1.664 m)    Weight:   Wt Readings from Last 1 Encounters:  12/03/14 158 lb 3.2 oz (71.759 kg)    Ideal Body Weight:  57.95 kg (kg)  BMI:  Body mass index is 25.92 kg/(m^2).  Estimated Nutritional Needs:   Kcal:  2150-2300  Protein:  70-80 grams  Fluid:  1.2 L/day per MD order  EDUCATION NEEDS:   No education needs identified at this time  Nancy Norris, RD, LDN Inpatient Clinical Dietitian Pager # [phone] After hours/weekend pager # [phone]  

## 2014-12-03 NOTE — Progress Notes (Signed)
CRITICAL VALUE ALERT  Critical value received: Hemoglobin 6.9  Date of notification:  12/03/14  Time of notification: 0405  Critical value read back:yes  Nurse who received alert:  Dellie Catholic  MD notified (1st page):  yes  Time of first page:  [phone]

## 2014-12-03 NOTE — ED Notes (Signed)
Per CT, based on pt's creatinine level, she is unable to get a CT angio chest. Pt should instead be referred to IR.

## 2014-12-03 NOTE — H&P (Signed)
[facility] History and Physical  Nancy Norris BDZ:[id number] DOB: 15-Dec-1940 DOA: 12/02/2014  Referring physician: Everlene Balls MD PCP: [name], MD   Chief Complaint: Dizziness  HPI: Nancy Norris is a 74 y.o. female with history of Hypothyroid COPD Dizziness Multiple Myeloma Breast Cancer presented to the ED with dizziness. Patient has also been having increased shortness of breath noted. All these symptoms have been going on for about a week. Patient seems to be more dizzy when she moves around. Patient has been having more shortness of breath when she walks. She states that when she gets up she feels weak. She has no chest pain noted but does have pain in the area of her hiatal hernia. She has not actually passed out. She has no recent travel. Patient does have a history of multiple myeloma and has been undergoing chemotherapy twice a week for this. She had a blood count checked on Monday which was 9.6 and now her Hgb is down to 7.8. She has not noted any blood in her stool. She states that her stools are normal colored. Patient does have a history of constipation which does make her stools hard and dark. Patient states that she has noted no fevers or chills. She has had noted nausea and no vomiting.   Review of Systems:  Constitutional:  No weight loss, night sweats, Fevers, chills, +fatigue.  HEENT:  No headaches, itching, ear ache, nasal congestion  Cardio-vascular:  No chest pain has epigastric area pain, swelling in lower extremities +dizziness  GI:  No heartburn, indigestion, abdominal pain, +nausea, no vomiting, diarrhea  Resp:  +shortness of breath. No coughing up of blood.  Skin:  no rash or lesions GU:  no dysuria, change in color of urine.  Musculoskeletal:  No joint pain or swelling. No decreased range of motion  Psych:  No change in mood or affect. No depression or anxiety   Past Medical History  Diagnosis Date  . Hyperkalemia   .  Hypothyroidism   . COPD (chronic obstructive pulmonary disease) ([place])   . Hyponatremia   . Dizziness   . Fibromyalgia   . Breast cancer ([place])   . Multiple myeloma   . Mucositis   . Hx of echocardiogram     a.  Echocardiogram (12/26/2012): EF 34-19%, grade 1 diastolic dysfunction;   b.  Echocardiogram (02/2013): EF 55-60%, no WMA, trivial effusion  . Fibromyalgia   . Arthritis   . Hx of cardiovascular stress test     LexiScan with low level exercise Myoview (02/2013): No ischemia, EF 72%; normal study  . Complication of anesthesia   . PONV (postoperative nausea and vomiting) 2008    after mastestomy  . Myocardial infarction [facility])     in past, patient was unaware.   . Anginal pain ([place])     used NTG x 2 May 31 and 06/15/13   . GERD (gastroesophageal reflux disease)   . Headache(784.0)   . Anxiety   . Depression   . CKD (chronic kidney disease) stage 3, GFR 30-59 ml/min   . Anemia   . History of blood transfusion     last one May 12   . Neuropathy [facility])    Past Surgical History  Procedure Laterality Date  . History of port removal    . Status post stem cell transplant on September 28, 2008.    Marland Kitchen Abdominal hysterectomy  1981  . Cholecystectomy  1971  . Mastectomy Left 2008  . Cataract  extraction, bilateral    . Breast surgery    . Eye surgery Bilateral     lens implant  . Breast reconstruction    . Portacath placement  12/2012    has had 2  . Av fistula placement Left 06/19/2013    Procedure: CREATION OF LEFT ARM ARTERIOVENOUS (AV) FISTULA ;  Surgeon: Angelia Mould, MD;  Location: [facility];  Service: Vascular;  Laterality: Left;   Social History:  reports that she quit smoking about 9 years ago. Her smoking use included Cigarettes. She has a 30 pack-year smoking history. She has never used smokeless tobacco. She reports that she does not drink alcohol or use illicit drugs.  Allergies  Allergen Reactions  . Codeine Anaphylaxis  . Latex Shortness Of Breath    Adhesive  products   . Other Other (See Comments)    Onion, chocolate causes migraines  . Onion Other (See Comments)    Causes migraine headaches  . Zyprexa [Olanzapine] Other (See Comments)    Confusion , dizzy,unsteady  . Adhesive [Tape] Other (See Comments)    blisters  . Iodinated Diagnostic Agents Itching    Happened 60 years ago  . Sulfa Antibiotics Itching    Family History  Problem Relation Age of Onset  . Arthritis Mother   . Asthma Mother   . Cancer Sister   . Hyperlipidemia Brother      Prior to Admission medications   Medication Sig Start Date End Date Taking? Authorizing Provider  acetaminophen (TYLENOL) 325 MG tablet Take 650 mg by mouth every 6 (six) hours as needed for mild pain or moderate pain.    Yes Historical Provider, MD  albuterol (PROVENTIL HFA;VENTOLIN HFA) 108 (90 BASE) MCG/ACT inhaler Inhale 2 puffs into the lungs 2 (two) times daily as needed for wheezing or shortness of breath. 06/04/13  Yes [name], MD  ascorbic acid (VITAMIN C) 250 MG CHEW Chew 250 mg by mouth daily.   Yes Historical Provider, MD  aspirin EC 81 MG tablet Take 1 tablet (81 mg total) by mouth daily. 01/29/13  Yes Scott T Kathlen Mody, PA-C  buPROPion (WELLBUTRIN XL) 300 MG 24 hr tablet Take 300 mg by mouth daily.  11/03/13  Yes Historical Provider, MD  carboxymethylcellulose (REFRESH PLUS) 0.5 % SOLN Place 1 drop into both eyes every 2 (two) hours.   Yes Historical Provider, MD  Cholecalciferol 4000 UNITS TABS Take 4,000 Units by mouth daily.   Yes Historical Provider, MD  citalopram (CELEXA) 40 MG tablet TAKE 1 TABLET BY MOUTH ONCE DAILY. 06/09/14  Yes [name], MD  cromolyn (OPTICROM) 4 % ophthalmic solution Place 1 drop into both eyes 4 (four) times daily.   Yes Historical Provider, MD  esomeprazole (NEXIUM) 40 MG capsule TAKE 1 CAPSULE BY MOUTH DAILY, EACH MORNING FOR ACID INDIGESTION 03/26/14  Yes [name], MD  Ferrous Sulfate 134 MG TABS Take 1 tablet (134 mg total) by mouth  every other day. 06/09/14  Yes [name], MD  Fluticasone-Salmeterol (ADVAIR) 100-50 MCG/DOSE AEPB Inhale 1 puff into the lungs 2 (two) times daily. 07/21/14  Yes [name], MD  gabapentin (NEURONTIN) 300 MG capsule TAKE 1 CAPSULE BY MOUTH AT BEDTIME. 07/21/14  Yes Courtney Forcucci, PA-C  isosorbide mononitrate (IMDUR) 30 MG 24 hr tablet TAKE 1 TABLET BY MOUTH DAILY. 02/21/14  Yes Josue Hector, MD  levothyroxine (SYNTHROID, LEVOTHROID) 150 MCG tablet TAKE 1 TABLET BY MOUTH ONCE DAILY. 06/09/14  Yes [name], MD  loratadine [facility])  10 MG tablet Take 10 mg by mouth daily.   Yes Historical Provider, MD  LORazepam (ATIVAN) 0.5 MG tablet Take 1 tablet (0.5 mg total) by mouth every 8 (eight) hours as needed (nausea). 08/18/14  Yes Curt Bears, MD  loteprednol (ALREX) 0.2 % SUSP Place 1 drop into both eyes 4 (four) times daily. 12/08/13  Yes Vicie Mutters, PA-C  meclizine (ANTIVERT) 32 MG tablet Take 1 tablet (32 mg total) by mouth 3 (three) times daily as needed. 04/04/14  Yes Verlee Monte, MD  midodrine (PROAMATINE) 10 MG tablet Take 1 tablet (10 mg total) by mouth 3 (three) times daily. 04/01/14  Yes Josue Hector, MD  ondansetron (ZOFRAN-ODT) 8 MG disintegrating tablet Take 1 tablet (8 mg total) by mouth every 8 (eight) hours as needed for nausea or vomiting. 05/01/14  Yes Adrena E Johnson, PA-C  pilocarpine (SALAGEN) 5 MG tablet TAKE 1 TABLET BY MOUTH 3 TIMES DAILY. 07/21/14  Yes Curt Bears, MD  prochlorperazine (COMPAZINE) 10 MG tablet TAKE 1 TABLET BY MOUTH EVERY 6 HOURS AS NEEDED FOR NAUSEA OR VOMITING. 08/17/14  Yes Curt Bears, MD  ranitidine (ZANTAC) 300 MG tablet TAKE 1 TABLET BY MOUTH AT BEDTIME, FOR ACID REFLUX AND INDIGESTION. 03/26/14  Yes [name], MD  RESTASIS 0.05 % ophthalmic emulsion Place 1 drop into both eyes 2 (two) times daily.  11/13/13  Yes Historical Provider, MD  senna (SENOKOT) 8.6 MG tablet Take 1 tablet by mouth at bedtime.   Yes Historical  Provider, MD  dexamethasone (DECADRON) 4 MG tablet Take 10 tablets by mouth once a week as directed Patient taking differently: Take 40 mg by mouth once a week. Only takes on Mondays when she has chemo 05/01/14   Carlton Adam, PA-C  furosemide (LASIX) 40 MG tablet Take 1 tablet (40 mg total) by mouth daily. Patient not taking: Reported on 12/03/2014 11/11/14 11/11/15  [name], MD  hydrOXYzine (ATARAX/VISTARIL) 25 MG tablet Take 1 tablet by mouth 3 (three) times daily as needed for vomiting. anxiety 11/09/14   Historical Provider, MD  loperamide (IMODIUM) 2 MG capsule Take two tabs po initially, then one tab after each loose stool: max 8 tabs in 24 hours Patient not taking: Reported on 10/21/2014 08/13/14   Loma Sousa Forcucci, PA-C  nitroGLYCERIN (NITROSTAT) 0.4 MG SL tablet Place 1 tablet (0.4 mg total) under the tongue every 5 (five) minutes as needed for chest pain. 01/29/13   Liliane Shi, PA-C  nystatin (MYCOSTATIN/NYSTOP) 100000 UNIT/GM POWD Apply 1 g topically 2 (two) times daily. Apply to belly    Historical Provider, MD   Physical Exam: Filed Vitals:   12/02/14 2237  BP: 111/52  Pulse: 78  Temp: 98.3 F (36.8 C)  TempSrc: Oral  Resp: 20  SpO2: 100%    Wt Readings from Last 3 Encounters:  11/29/14 69.128 kg (152 lb 6.4 oz)  11/15/14 71.351 kg (157 lb 4.8 oz)  10/21/14 70.761 kg (156 lb)    General:  Appears calm and comfortable Eyes: PERRL, normal lids, irises & conjunctiva ENT: grossly normal hearing, lips & tongue Neck: no LAD, masses or thyromegaly Cardiovascular: RRR, no m/r/g. No LE edema Respiratory: CTA bilaterally, no w/r/r Abdomen: soft, ntnd Skin: no rash or induration seen on limited exam Musculoskeletal: grossly normal tone BUE/BLE Psychiatric: grossly normal mood and affect Neurologic: grossly non-focal.          Labs on Admission:  Basic Metabolic Panel:  Recent Labs Lab 11/29/14 1115 12/02/14 2315  NA 142 140  K 4.6 3.8  CL  --  113*    CO2 20* 18*  GLUCOSE 98 100*  BUN 45.2* 51*  CREATININE 3.8* 3.94*  CALCIUM 9.4 8.9   Liver Function Tests:  Recent Labs Lab 11/29/14 1115  AST 13  ALT 14  ALKPHOS 104  BILITOT 1.07  PROT 6.5  ALBUMIN 4.1   No results for input(s): LIPASE, AMYLASE in the last 168 hours. No results for input(s): AMMONIA in the last 168 hours. CBC:  Recent Labs Lab 11/29/14 1114 12/02/14 2315  WBC 5.0 4.8  NEUTROABS 2.9 3.2  HGB 9.6* 7.8*  HCT 29.6* 24.0*  MCV 106.8* 106.7*  PLT 184 54*   Cardiac Enzymes: No results for input(s): CKTOTAL, CKMB, CKMBINDEX, TROPONINI in the last 168 hours.  BNP (last 3 results)  Recent Labs  07/13/14 1125  BNP 968.2*    ProBNP (last 3 results) No results for input(s): PROBNP in the last 8760 hours.  CBG: No results for input(s): GLUCAP in the last 168 hours.  Radiological Exams on Admission: Dg Chest 2 View  12/02/2014  CLINICAL DATA:  Acute onset of shortness of breath and dizziness. Initial encounter. EXAM: CHEST  2 VIEW COMPARISON:  Chest radiograph performed 07/13/2014 FINDINGS: The lungs are well-aerated. Pulmonary vascularity is at the upper limits of normal. There is no evidence of focal opacification, pleural effusion or pneumothorax. The heart is borderline normal in size. A right-sided chest port is noted ending about the distal SVC. No acute osseous abnormalities are seen. A clip is noted overlying the left breast. IMPRESSION: No acute cardiopulmonary process seen. Electronically Signed   By: Garald Balding M.D.   On: 12/02/2014 23:16      Assessment/Plan Active Problems:   Hypothyroid   HTN (hypertension)   GERD (gastroesophageal reflux disease)   Multiple myeloma ([facility])   Anemia   CHF (congestive heart failure) ([place])   1. Dizziness/Weakness -unclear etiology concern for low Hgb and also has SOB so ED suggested PE -There has been a drop in her Hgb over the last couple of days -there was concern raised by the ED for a PE  however cannot have a CT scan and cannot have a VQ scan at this time -will place on lovenox now but need to monitor her Hgb closely as I am concerned over the drop in herr Hgb over the last 2 days. If her VQ is negative in the morning would stop lovenox -needs a VQ scan in am if negative would DC lovenox -will type and cross for 2 units  2. Elevated troponin -will get serial enzymes -unsure as to cause of the rise of her troponin -she does apparently have a history of CAD  3. HTN -continue with home medications -monitor pressures  4. GERD -will continue with PPI and pepcid  5. Multiple Myeloma -will need to have oncology follow up -last chemotherapy on tuesday  6. Anemia -on iron supplementation  7. CHF -will continue with imdur -currently she is compensated -Echo in 2016 shows an EF of 60-65%  8. COPD -will continue with advair -continue with proair  9. Hypothyroid -will continue with synthroid -check TSH     Code Status: full code DVT Prophylaxis:lovenox Family Communication: none Disposition Plan: home    [facility] Pager [phone]

## 2014-12-03 NOTE — Progress Notes (Signed)
  Echocardiogram 2D Echocardiogram has been performed.  Joelene Millin 12/03/2014, 3:13 PM

## 2014-12-04 ENCOUNTER — Encounter (HOSPITAL_COMMUNITY): Payer: Self-pay | Admitting: Internal Medicine

## 2014-12-04 DIAGNOSIS — R0602 Shortness of breath: Secondary | ICD-10-CM

## 2014-12-04 DIAGNOSIS — E538 Deficiency of other specified B group vitamins: Secondary | ICD-10-CM | POA: Diagnosis present

## 2014-12-04 DIAGNOSIS — I10 Essential (primary) hypertension: Secondary | ICD-10-CM

## 2014-12-04 DIAGNOSIS — D696 Thrombocytopenia, unspecified: Secondary | ICD-10-CM | POA: Diagnosis present

## 2014-12-04 DIAGNOSIS — C9000 Multiple myeloma not having achieved remission: Secondary | ICD-10-CM

## 2014-12-04 DIAGNOSIS — K219 Gastro-esophageal reflux disease without esophagitis: Secondary | ICD-10-CM

## 2014-12-04 DIAGNOSIS — E039 Hypothyroidism, unspecified: Secondary | ICD-10-CM

## 2014-12-04 DIAGNOSIS — D649 Anemia, unspecified: Secondary | ICD-10-CM

## 2014-12-04 HISTORY — DX: Deficiency of other specified B group vitamins: E53.8

## 2014-12-04 LAB — BASIC METABOLIC PANEL
ANION GAP: 8 (ref 5–15)
BUN: 42 mg/dL — ABNORMAL HIGH (ref 6–20)
CHLORIDE: 114 mmol/L — AB (ref 101–111)
CO2: 19 mmol/L — ABNORMAL LOW (ref 22–32)
Calcium: 8.3 mg/dL — ABNORMAL LOW (ref 8.9–10.3)
Creatinine, Ser: 3.55 mg/dL — ABNORMAL HIGH (ref 0.44–1.00)
GFR calc Af Amer: 14 mL/min — ABNORMAL LOW (ref >60)
GFR, EST NON AFRICAN AMERICAN: 12 mL/min — AB (ref >60)
GLUCOSE: 96 mg/dL (ref 65–99)
POTASSIUM: 4.1 mmol/L (ref 3.5–5.1)
Sodium: 141 mmol/L (ref 135–145)

## 2014-12-04 LAB — CBC
HCT: 28.4 % — ABNORMAL LOW (ref 36.0–46.0)
HEMOGLOBIN: 9.6 g/dL — AB (ref 12.0–15.0)
MCH: 33.1 pg (ref 26.0–34.0)
MCHC: 33.8 g/dL (ref 30.0–36.0)
MCV: 97.9 fL (ref 78.0–100.0)
PLATELETS: 28 10*3/uL — AB (ref 150–400)
RBC: 2.9 MIL/uL — AB (ref 3.87–5.11)
RDW: 18.8 % — ABNORMAL HIGH (ref 11.5–15.5)
WBC: 4.9 10*3/uL (ref 4.0–10.5)

## 2014-12-04 LAB — TYPE AND SCREEN
ABO/RH(D): O POS
Antibody Screen: NEGATIVE
UNIT DIVISION: 0
Unit division: 0

## 2014-12-04 LAB — GLUCOSE, CAPILLARY: GLUCOSE-CAPILLARY: 89 mg/dL (ref 65–99)

## 2014-12-04 MED ORDER — LEVOTHYROXINE SODIUM 25 MCG PO TABS
137.0000 ug | ORAL_TABLET | Freq: Every day | ORAL | Status: DC
  Administered 2014-12-05 – 2014-12-07 (×3): 137 ug via ORAL
  Filled 2014-12-04 (×3): qty 1

## 2014-12-04 MED ORDER — TIOTROPIUM BROMIDE MONOHYDRATE 18 MCG IN CAPS
18.0000 ug | ORAL_CAPSULE | Freq: Every day | RESPIRATORY_TRACT | Status: DC
  Administered 2014-12-05 – 2014-12-06 (×2): 18 ug via RESPIRATORY_TRACT
  Filled 2014-12-04: qty 5

## 2014-12-04 NOTE — Progress Notes (Signed)
[facility] PROGRESS NOTE  Nancy Norris YHC:[id number] DOB: 1940-11-27 DOA: 12/02/2014 PCP: [name], MD  Assessment/Plan: #1 symptomatic anemia Likely secondary to recent chemotherapy and multiple myeloma. Patient had presented with shortness of breath, dizziness, fatigue. Patient with clinical improvement. Patient is status post 2 units packed red blood cells with appropriate response with hemoglobin currently at 9.6 from 6.9 on admission. Patient denies any overt bleeding. FOBT negative. Follow H&H.  #2 thrombocytopenia Likely secondary to recent chemotherapy plus or minus multiple myeloma. Patient denies any overt bleeding. Hematology/oncology was consulted yesterday and consultation pending. Follow platelets. Transfuse platelets if platelet count less than 10,000 or patient starts to bleed. Follow.  #3 chronic kidney disease stage IV Stable. Outpatient follow-up. Change renal diet to regular diet per patient request.  #4 shortness of breath Likely secondary to problem #1. VQ scan was negative for acute PE. Cardiac enzymes minimally elevated and trending down. 2-D echo with a EF of 50-55% with no noted wall motion abnormalities, grade 1 diastolic dysfunction. Improved clinically post transfusion.  #5 elevated troponin Likely demand ischemia secondary to problem #1 as patient was noted to have a hemoglobin of 6.9 on admission. Patient denies any current chest pain. 2-D echo with a EF of 50-55 patient with no noted wall motion abnormalities, grade 1 diastolic dysfunction. Follow for now.  #6 gastroesophageal reflux disease EPI and Pepcid.  #7 hypertension Blood pressure stable. Continue imdur.  #8 B 12 deficiency Continue B-12 IM injections. Outpatient follow-up.  #9 hypothyroidism TSH is 0.123. Decrease Synthroid to 137 mcg daily. Will need repeat thyroid function studies done in 4-6 weeks.  #10 multiple myeloma Patient receiving chemotherapy treatments. Will  need to follow-up with oncology.  #11 history of CHF Currently stable. Compensated. Continue Imdur. 2-D echo with EF of 50-55% with no wall motion abnormalities.  #12 COPD Stable. Continue Advair. Will place on Spiriva.  #13 prophylaxis Pepcid for GI prophylaxis. SCDs for DVT prophylaxis.   Code Status: Full. Family Communication: Updated patient. No family present. Disposition Plan: Home when hemoglobin stabilizes and platelets stabilize or improve.   Consultants:  Hematology/Oncology pending  Procedures:  VQ scan 12/03/2014  2-D echo 12/04/2014 pending  Chest x-ray 12/02/2014  2 units packed red blood cells 12/04/2014  Antibiotics:  None  HPI/Subjective: Patient states shortness of breath has improved. Patient denies any dizziness. Patient denies any chest pain. Patient does not like her renal diet was to be placed on a regular diet. Patient denies any bleeding.  Objective: Filed Vitals:   12/04/14 1432  BP: 122/63  Pulse: 61  Temp: 97.8 F (36.6 C)  Resp: 18    Intake/Output Summary (Last 24 hours) at 12/04/14 1500 Last data filed at 12/04/14 1020  Gross per 24 hour  Intake 1781.5 ml  Output    300 ml  Net 1481.5 ml   Filed Weights   12/03/14 0210 12/03/14 0450 12/04/14 0511  Weight: 72.576 kg (160 lb) 71.759 kg (158 lb 3.2 oz) 71.85 kg (158 lb 6.4 oz)    Exam:   General:  NAD  Cardiovascular: RRR  Respiratory: CTAB  Abdomen: Soft, nontender, nondistended, positive bowel sounds.  Musculoskeletal: No clubbing cyanosis or edema.  Data Reviewed: Basic Metabolic Panel:  Recent Labs Lab 11/29/14 1115 12/02/14 2315 12/03/14 0300 12/04/14 0400  NA 142 140 138 141  K 4.6 3.8 3.5 4.1  CL  --  113* 113* 114*  CO2 20* 18* 19* 19*  GLUCOSE 98 100* 94 96  BUN  45.2* 51* 49* 42*  CREATININE 3.8* 3.94* 3.76* 3.55*  CALCIUM 9.4 8.9 8.2* 8.3*   Liver Function Tests:  Recent Labs Lab 11/29/14 1115 12/03/14 0300  AST 13 13*  ALT 14 27   ALKPHOS 104 70  BILITOT 1.07 1.3*  PROT 6.5 5.3*  ALBUMIN 4.1 3.4*   No results for input(s): LIPASE, AMYLASE in the last 168 hours. No results for input(s): AMMONIA in the last 168 hours. CBC:  Recent Labs Lab 11/29/14 1114 12/02/14 2315 12/03/14 0300 12/04/14 0400  WBC 5.0 4.8 3.9* 4.9  NEUTROABS 2.9 3.2  --   --   HGB 9.6* 7.8* 6.9* 9.6*  HCT 29.6* 24.0* 21.6* 28.4*  MCV 106.8* 106.7* 106.9* 97.9  PLT 184 54* 41* 28*   Cardiac Enzymes:  Recent Labs Lab 12/03/14 0300 12/03/14 0845 12/03/14 1630  TROPONINI 0.07* 0.06* 0.04*   BNP (last 3 results)  Recent Labs  07/13/14 1125  BNP 968.2*    ProBNP (last 3 results) No results for input(s): PROBNP in the last 8760 hours.  CBG:  Recent Labs Lab 12/03/14 0757 12/04/14 0742  GLUCAP 82 89    No results found for this or any previous visit (from the past 240 hour(s)).   Studies: Dg Chest 2 View  12/02/2014  CLINICAL DATA:  Acute onset of shortness of breath and dizziness. Initial encounter. EXAM: CHEST  2 VIEW COMPARISON:  Chest radiograph performed 07/13/2014 FINDINGS: The lungs are well-aerated. Pulmonary vascularity is at the upper limits of normal. There is no evidence of focal opacification, pleural effusion or pneumothorax. The heart is borderline normal in size. A right-sided chest port is noted ending about the distal SVC. No acute osseous abnormalities are seen. A clip is noted overlying the left breast. IMPRESSION: No acute cardiopulmonary process seen. Electronically Signed   By: Garald Balding M.D.   On: 12/02/2014 23:16   Nm Pulmonary Perf And Vent  12/03/2014  CLINICAL DATA:  Shortness of breath for 1 week. History of COPD of multiple myeloma. Initial encounter. EXAM: NUCLEAR MEDICINE VENTILATION - PERFUSION LUNG SCAN TECHNIQUE: Ventilation images were obtained in multiple projections using inhaled aerosol Tc-62mDTPA. Perfusion images were obtained in multiple projections after intravenous  injection of Tc-979mAA. RADIOPHARMACEUTICALS:  3275.4illicuries TeHKGOVPCHEK-35CTPA aerosol inhalation and 4.2 millicuries TeYELYHTMBPJ-12TAA IV COMPARISON:  None. FINDINGS: Ventilation: No focal ventilation defect. Perfusion: No wedge shaped peripheral perfusion defects to suggest acute pulmonary embolism. IMPRESSION: Negative exam. Electronically Signed   By: ThInge Rise.D.   On: 12/03/2014 11:10    Scheduled Meds: . sodium chloride   Intravenous Once  . buPROPion  300 mg Oral Daily  . cholecalciferol  4,000 Units Oral Daily  . citalopram  40 mg Oral Daily  . cromolyn  1 drop Both Eyes QID  . cyanocobalamin  1,000 mcg Intramuscular Daily  . cycloSPORINE  1 drop Both Eyes BID  . famotidine  10 mg Oral Daily  . ferrous sulfate  325 mg Oral QODAY  . folic acid  1 mg Oral Daily  . gabapentin  300 mg Oral QHS  . isosorbide mononitrate  30 mg Oral Daily  . levothyroxine  150 mcg Oral Daily  . loteprednol  1 drop Both Eyes QID  . midodrine  10 mg Oral TID  . mometasone-formoterol  2 puff Inhalation BID  . multivitamin with minerals  1 tablet Oral Daily  . pantoprazole  40 mg Oral Daily  . pilocarpine  5 mg Oral  TID  . sodium chloride  10-40 mL Intracatheter Q12H  . sodium chloride  3 mL Intravenous Q12H  . thiamine  100 mg Oral Daily  . vitamin C  250 mg Oral Daily   Continuous Infusions:   Principal Problem:   Symptomatic anemia Active Problems:   Thrombocytopenia (HCC)   Hypothyroid   HTN (hypertension)   GERD (gastroesophageal reflux disease)   Multiple myeloma (HCC)   CHF (congestive heart failure) (HCC)   Dizziness   Malnutrition of moderate degree   B12 deficiency    Time spent: 37 mins    [facility] MD [facility] Pager [phone]. If 7PM-7AM, please contact night-coverage at [email or web address], password [facility] 12/04/2014, 3:00 PM  LOS: 1 day

## 2014-12-04 NOTE — Progress Notes (Signed)
CRITICAL VALUE ALERT  Critical value received:  Platelets 28   Date of notification:  12/04/2014  Time of notification:  0512  Critical value read back:Yes.    Nurse who received alert:  Thedora Hinders, RN  MD notified (1st page):  T.Callahan  Time of first page:  0514  MD notified (2nd page):  Time of second page:  Responding MD:  T.Callahan  Time MD responded:  3846  No new orders given, will continue to monitor.

## 2014-12-04 NOTE — Evaluation (Signed)
Physical Therapy Evaluation Patient Details Name: Nancy Norris MRN: [id number] DOB: 1940-02-01 Today's Date: 12/04/2014   History of Present Illness  74 yo female admitted with symptomatic anemia. Hx of multiple myeloma, COPD,MI,CKD,fibromyalgia, breat cancer,dizziness/BPPV, arthritis, neuropathy, anemia.   Clinical Impression  On eval, pt required Min guard assist for mobility-walked ~300 feet while holding on to IV pole. Pt denied lightheadedness/dizziness. Dyspnea 3/4 with ambulation. Recommend daily ambulation in hallway with nursing supervision.     Follow Up Recommendations Home health PT;No PT follow up (depending on progress. May not need HHPT is she continues to progress)    Equipment Recommendations  None recommended by PT    Recommendations for Other Services       Precautions / Restrictions Precautions Precautions: Fall Restrictions Weight Bearing Restrictions: No      Mobility  Bed Mobility Overal bed mobility: Modified Independent                Transfers Overall transfer level: Modified independent                  Ambulation/Gait Ambulation/Gait assistance: Min guard Ambulation Distance (Feet): 300 Feet Assistive device:  (IV pole) Gait Pattern/deviations: Step-through pattern;Decreased stride length     General Gait Details: 2 brief standing rest breaks due to dyspnea. O2 sats 95% on RA.   Stairs            Wheelchair Mobility    Modified Rankin (Stroke Patients Only)       Balance Overall balance assessment: Needs assistance         Standing balance support: During functional activity Standing balance-Leahy Scale: Fair                               Pertinent Vitals/Pain Pain Assessment: No/denies pain    Home Living Family/patient expects to be discharged to:: Private residence Living Arrangements: Alone Available Help at Discharge: Neighbor;Available PRN/intermittently Type of Home:  Apartment Home Access: Ramped entrance     Home Layout: One level Home Equipment: Walker - 2 wheels;Walker - 4 wheels;Cane - single point;Shower seat      Prior Function Level of Independence: Independent               Hand Dominance        Extremity/Trunk Assessment   Upper Extremity Assessment: Overall WFL for tasks assessed           Lower Extremity Assessment: Generalized weakness      Cervical / Trunk Assessment: Normal  Communication   Communication: No difficulties  Cognition Arousal/Alertness: Awake/alert Behavior During Therapy: WFL for tasks assessed/performed Overall Cognitive Status: Within Functional Limits for tasks assessed                      General Comments      Exercises        Assessment/Plan    PT Assessment Patient needs continued PT services  PT Diagnosis Difficulty walking;Generalized weakness   PT Problem List Decreased activity tolerance;Decreased mobility;Decreased strength  PT Treatment Interventions Gait training;Therapeutic activities;Functional mobility training;Patient/family education;Therapeutic exercise   PT Goals (Current goals can be found in the Care Plan section) Acute Rehab PT Goals Patient Stated Goal: to get stronger. walk PT Goal Formulation: With patient Time For Goal Achievement: 12/18/14 Potential to Achieve Goals: Good    Frequency Min 3X/week   Barriers to discharge  Co-evaluation               End of Session Equipment Utilized During Treatment: Gait belt Activity Tolerance: Patient tolerated treatment well Patient left: in chair;with call bell/phone within reach           Time: 1331-1344 PT Time Calculation (min) (ACUTE ONLY): 13 min   Charges:   PT Evaluation $Initial PT Evaluation Tier I: 1 Procedure     PT G Codes:        Weston Anna, MPT Pager: [phone]

## 2014-12-05 LAB — CBC
HEMATOCRIT: 27.6 % — AB (ref 36.0–46.0)
Hemoglobin: 9.1 g/dL — ABNORMAL LOW (ref 12.0–15.0)
MCH: 32.4 pg (ref 26.0–34.0)
MCHC: 33 g/dL (ref 30.0–36.0)
MCV: 98.2 fL (ref 78.0–100.0)
PLATELETS: 30 10*3/uL — AB (ref 150–400)
RBC: 2.81 MIL/uL — ABNORMAL LOW (ref 3.87–5.11)
RDW: 18.5 % — AB (ref 11.5–15.5)
WBC: 5.4 10*3/uL (ref 4.0–10.5)

## 2014-12-05 LAB — RENAL FUNCTION PANEL
Albumin: 3.5 g/dL (ref 3.5–5.0)
Anion gap: 11 (ref 5–15)
BUN: 36 mg/dL — AB (ref 6–20)
CHLORIDE: 111 mmol/L (ref 101–111)
CO2: 17 mmol/L — ABNORMAL LOW (ref 22–32)
Calcium: 8.2 mg/dL — ABNORMAL LOW (ref 8.9–10.3)
Creatinine, Ser: 3.09 mg/dL — ABNORMAL HIGH (ref 0.44–1.00)
GFR calc Af Amer: 16 mL/min — ABNORMAL LOW (ref >60)
GFR calc non Af Amer: 14 mL/min — ABNORMAL LOW (ref >60)
GLUCOSE: 85 mg/dL (ref 65–99)
POTASSIUM: 3.8 mmol/L (ref 3.5–5.1)
Phosphorus: 3 mg/dL (ref 2.5–4.6)
Sodium: 139 mmol/L (ref 135–145)

## 2014-12-05 LAB — GLUCOSE, CAPILLARY: Glucose-Capillary: 80 mg/dL (ref 65–99)

## 2014-12-05 LAB — MAGNESIUM: Magnesium: 2.2 mg/dL (ref 1.7–2.4)

## 2014-12-05 MED ORDER — SODIUM BICARBONATE 650 MG PO TABS
650.0000 mg | ORAL_TABLET | Freq: Two times a day (BID) | ORAL | Status: DC
  Administered 2014-12-05 – 2014-12-07 (×5): 650 mg via ORAL
  Filled 2014-12-05 (×5): qty 1

## 2014-12-05 MED ORDER — CETYLPYRIDINIUM CHLORIDE 0.05 % MT LIQD
7.0000 mL | Freq: Two times a day (BID) | OROMUCOSAL | Status: DC
  Administered 2014-12-05 (×2): 7 mL via OROMUCOSAL

## 2014-12-05 MED ORDER — SORBITOL 70 % SOLN
30.0000 mL | Freq: Once | Status: AC
  Administered 2014-12-05: 30 mL via ORAL
  Filled 2014-12-05: qty 30

## 2014-12-05 MED ORDER — SENNOSIDES-DOCUSATE SODIUM 8.6-50 MG PO TABS
1.0000 | ORAL_TABLET | Freq: Every day | ORAL | Status: DC
  Administered 2014-12-05 – 2014-12-06 (×2): 1 via ORAL
  Filled 2014-12-05 (×2): qty 1

## 2014-12-05 MED ORDER — CHLORHEXIDINE GLUCONATE 0.12 % MT SOLN
15.0000 mL | Freq: Two times a day (BID) | OROMUCOSAL | Status: DC
  Administered 2014-12-05 – 2014-12-06 (×4): 15 mL via OROMUCOSAL
  Filled 2014-12-05 (×5): qty 15

## 2014-12-05 NOTE — Care Management Note (Signed)
Case Management Note  Patient Details  Name: Nancy Norris MRN: [id number] Date of Birth: 1940/11/15  Subjective/Objective:                  Dizziness, anemia  Action/Plan: CM spoke with the patient at the bedside. Patient states she lives alone. Provided with a list of [place] providers. Patient selected [place]. Amy at [facility] notified of the referral.   Expected Discharge Date:     unknown             Expected Discharge Plan:  [place]  In-House Referral:     Discharge planning Services  CM Consult  Post Acute Care Choice:  Home Health Choice offered to:  Patient  DME Arranged:  N/A DME Agency:  NA  HH Arranged:  PT HH Agency:  [place]  Status of Service:  Completed, signed off  Medicare Important Message Given:    Date Medicare IM Given:    Medicare IM give by:    Date Additional Medicare IM Given:    Additional Medicare Important Message give by:     If discussed at [place] of Stay Meetings, dates discussed:    Additional Comments:  Apolonio Schneiders, RN 12/05/2014, 10:42 AM

## 2014-12-05 NOTE — Progress Notes (Signed)
Utilization Review Completed.Vanetta Rule T11/20/2016  

## 2014-12-05 NOTE — Progress Notes (Signed)
Physical Therapy Treatment Patient Details Name: Nancy Norris MRN: [id number] DOB: 05/26/1940 Today's Date: 12/05/2014    History of Present Illness 74 yo female admitted with symptomatic anemia. Hx of multiple myeloma, COPD,MI,CKD,fibromyalgia, breat cancer,dizziness/BPPV, arthritis, neuropathy, anemia.     PT Comments    Pt OOB in recliner "feeling better".  Assisted with amb around full unit, no AD and no LOB.  Only one standing rest break due to mild dyspnea.  Unable to attempt stair training due to IV.   Follow Up Recommendations  No PT follow up;Home health PT (pt preferance)     Equipment Recommendations  None recommended by PT    Recommendations for Other Services       Precautions / Restrictions Precautions Precautions: Fall Precaution Comments: CHEMO Restrictions Weight Bearing Restrictions: No    Mobility  Bed Mobility               General bed mobility comments: Pt OOB in recliner  Transfers Overall transfer level: Modified independent                  Ambulation/Gait Ambulation/Gait assistance: Supervision Ambulation Distance (Feet): 500 Feet Assistive device: None Gait Pattern/deviations: Step-through pattern Gait velocity: WFL   General Gait Details: one standing rest break due to mild dyspnea.  "I feel much better" stated pt.  No LOB and good safety cognition.     Stairs Stairs:  (unable to attempt stairs due to IV pole)          Wheelchair Mobility    Modified Rankin (Stroke Patients Only)       Balance                                    Cognition Arousal/Alertness: Awake/alert Behavior During Therapy: WFL for tasks assessed/performed Overall Cognitive Status: Within Functional Limits for tasks assessed                      Exercises      General Comments        Pertinent Vitals/Pain Pain Assessment: No/denies pain    Home Living                      Prior Function             PT Goals (current goals can now be found in the care plan section) Progress towards PT goals: Progressing toward goals    Frequency  Min 3X/week    PT Plan      Co-evaluation             End of Session Equipment Utilized During Treatment: Gait belt Activity Tolerance: Patient tolerated treatment well Patient left: in chair;with call bell/phone within reach     Time: 1335-1355 PT Time Calculation (min) (ACUTE ONLY): 20 min  Charges:  $Gait Training: 8-22 mins                    G Codes:      Rica Koyanagi  PTA [facility]  Acute  Rehab Pager      [phone]

## 2014-12-05 NOTE — Progress Notes (Signed)
[facility] PROGRESS NOTE  Nancy Norris IHK:[id number] DOB: [birth year] DOA: 12/02/2014 PCP: [name], MD  Assessment/Plan: #1 symptomatic anemia Likely secondary to recent chemotherapy and multiple myeloma. Patient had presented with shortness of breath, dizziness, fatigue. Patient with clinical improvement. Patient is status post 2 units packed red blood cells with appropriate response with hemoglobin currently at 9.1 from 6.9 on admission. Patient denies any overt bleeding. FOBT negative. Follow H&H.  #2 thrombocytopenia Likely secondary to recent chemotherapy plus or minus multiple myeloma. Patient denies any overt bleeding. Hematology/oncology was consulted and consultation pending. Follow platelets. Transfuse platelets if platelet count less than 10,000 or patient starts to bleed. Follow.  #3 chronic kidney disease stage IV/metabolic acidosis likely secondary to chronic kidney disease. Stable. Outpatient follow-up. We will place on bicarbonate tablets.   #4 shortness of breath Likely secondary to problem #1. VQ scan was negative for acute PE. Cardiac enzymes minimally elevated and trending down. 2-D echo with a EF of 50-55% with no noted wall motion abnormalities, grade 1 diastolic dysfunction. Improved clinically post transfusion.  #5 elevated troponin Likely demand ischemia secondary to problem #1 as patient was noted to have a hemoglobin of 6.9 on admission. Patient denies any current chest pain. 2-D echo with a EF of 50-55 patient with no noted wall motion abnormalities, grade 1 diastolic dysfunction. Follow for now.  #6 gastroesophageal reflux disease PPI and Pepcid.  #7 hypertension Blood pressure stable. Continue imdur.  #8 B 12 deficiency Continue B-12 IM injections. Outpatient follow-up.  #9 hypothyroidism TSH is 0.123. Decreased  Synthroid to 137 mcg daily. Will need repeat thyroid function studies done in 4-6 weeks.  #10 multiple myeloma Patient  receiving chemotherapy treatments. Will need to follow-up with oncology.  #11 history of CHF Currently stable. Compensated. Continue Imdur. 2-D echo with EF of 50-55% with no wall motion abnormalities.  #12 COPD Stable. Continue Advair and Spiriva.  #13 prophylaxis Pepcid for GI prophylaxis. SCDs for DVT prophylaxis.   Code Status: Full. Family Communication: Updated patient. No family present. Disposition Plan: Home when hemoglobin stabilizes and platelets stabilize or improve, hopefully tomorrow.   Consultants:  Hematology/Oncology pending  Procedures:  VQ scan 12/03/2014  2-D echo 12/04/2014 pending  Chest x-ray 12/02/2014  2 units packed red blood cells 12/04/2014  Antibiotics:  None  HPI/Subjective: Patient states shortness of breath has improved. Patient denies any dizziness. Patient c/o cramping in hands. Patient denies bleeding. Patient states feeling tired.  Objective: Filed Vitals:   12/04/14 2123 12/05/14 0526  BP:  105/58  Pulse: 66 67  Temp:  97.6 F (36.4 C)  Resp: 18 18    Intake/Output Summary (Last 24 hours) at 12/05/14 1310 Last data filed at 12/05/14 0612  Gross per 24 hour  Intake    300 ml  Output   1200 ml  Net   -900 ml   Filed Weights   12/03/14 0450 12/04/14 0511 12/05/14 0526  Weight: 71.759 kg (158 lb 3.2 oz) 71.85 kg (158 lb 6.4 oz) 72.621 kg (160 lb 1.6 oz)    Exam:   General:  NAD  Cardiovascular: RRR  Respiratory: CTAB  Abdomen: Soft, nontender, nondistended, positive bowel sounds.  Musculoskeletal: No clubbing cyanosis or edema.  Data Reviewed: Basic Metabolic Panel:  Recent Labs Lab 11/29/14 1115 12/02/14 2315 12/03/14 0300 12/04/14 0400 12/05/14 0645  NA 142 140 138 141 139  K 4.6 3.8 3.5 4.1 3.8  CL  --  113* 113* 114* 111  CO2 20* 18*  19* 19* 17*  GLUCOSE 98 100* 94 96 85  BUN 45.2* 51* 49* 42* 36*  CREATININE 3.8* 3.94* 3.76* 3.55* 3.09*  CALCIUM 9.4 8.9 8.2* 8.3* 8.2*  PHOS  --   --   --    --  3.0   Liver Function Tests:  Recent Labs Lab 11/29/14 1115 12/03/14 0300 12/05/14 0645  AST 13 13*  --   ALT 14 27  --   ALKPHOS 104 70  --   BILITOT 1.07 1.3*  --   PROT 6.5 5.3*  --   ALBUMIN 4.1 3.4* 3.5   No results for input(s): LIPASE, AMYLASE in the last 168 hours. No results for input(s): AMMONIA in the last 168 hours. CBC:  Recent Labs Lab 11/29/14 1114 12/02/14 2315 12/03/14 0300 12/04/14 0400 12/05/14 0645  WBC 5.0 4.8 3.9* 4.9 5.4  NEUTROABS 2.9 3.2  --   --   --   HGB 9.6* 7.8* 6.9* 9.6* 9.1*  HCT 29.6* 24.0* 21.6* 28.4* 27.6*  MCV 106.8* 106.7* 106.9* 97.9 98.2  PLT 184 54* 41* 28* 30*   Cardiac Enzymes:  Recent Labs Lab 12/03/14 0300 12/03/14 0845 12/03/14 1630  TROPONINI 0.07* 0.06* 0.04*   BNP (last 3 results)  Recent Labs  07/13/14 1125  BNP 968.2*    ProBNP (last 3 results) No results for input(s): PROBNP in the last 8760 hours.  CBG:  Recent Labs Lab 12/03/14 0757 12/04/14 0742 12/05/14 0748  GLUCAP 82 89 80    No results found for this or any previous visit (from the past 240 hour(s)).   Studies: No results found.  Scheduled Meds: . sodium chloride   Intravenous Once  . antiseptic oral rinse  7 mL Mouth Rinse q12n4p  . buPROPion  300 mg Oral Daily  . chlorhexidine  15 mL Mouth Rinse BID  . cholecalciferol  4,000 Units Oral Daily  . citalopram  40 mg Oral Daily  . cromolyn  1 drop Both Eyes QID  . cyanocobalamin  1,000 mcg Intramuscular Daily  . cycloSPORINE  1 drop Both Eyes BID  . famotidine  10 mg Oral Daily  . ferrous sulfate  325 mg Oral QODAY  . folic acid  1 mg Oral Daily  . gabapentin  300 mg Oral QHS  . isosorbide mononitrate  30 mg Oral Daily  . levothyroxine  137 mcg Oral QAC breakfast  . loteprednol  1 drop Both Eyes QID  . midodrine  10 mg Oral TID  . mometasone-formoterol  2 puff Inhalation BID  . multivitamin with minerals  1 tablet Oral Daily  . pantoprazole  40 mg Oral Daily  .  pilocarpine  5 mg Oral TID  . senna-docusate  1 tablet Oral QHS  . sodium bicarbonate  650 mg Oral BID  . sodium chloride  10-40 mL Intracatheter Q12H  . sodium chloride  3 mL Intravenous Q12H  . thiamine  100 mg Oral Daily  . tiotropium  18 mcg Inhalation Daily  . vitamin C  250 mg Oral Daily   Continuous Infusions:   Principal Problem:   Symptomatic anemia Active Problems:   Thrombocytopenia (HCC)   Hypothyroid   HTN (hypertension)   GERD (gastroesophageal reflux disease)   Multiple myeloma (HCC)   CHF (congestive heart failure) (HCC)   Dizziness   Malnutrition of moderate degree   B12 deficiency   SOB (shortness of breath)    Time spent: 35 mins    Azula Zappia MD [facility] Pager  [phone]. If 7PM-7AM, please contact night-coverage at [email or web address], password [facility] 12/05/2014, 1:10 PM  LOS: 2 days

## 2014-12-06 ENCOUNTER — Inpatient Hospital Stay (HOSPITAL_COMMUNITY): Payer: Medicare Other

## 2014-12-06 ENCOUNTER — Telehealth: Payer: Self-pay | Admitting: Internal Medicine

## 2014-12-06 ENCOUNTER — Ambulatory Visit: Payer: Medicare Other

## 2014-12-06 ENCOUNTER — Other Ambulatory Visit: Payer: Medicare Other

## 2014-12-06 DIAGNOSIS — I5032 Chronic diastolic (congestive) heart failure: Secondary | ICD-10-CM

## 2014-12-06 DIAGNOSIS — R112 Nausea with vomiting, unspecified: Secondary | ICD-10-CM

## 2014-12-06 LAB — CBC WITH DIFFERENTIAL/PLATELET
BASOS ABS: 0 10*3/uL (ref 0.0–0.1)
BASOS PCT: 0 %
EOS ABS: 0.3 10*3/uL (ref 0.0–0.7)
Eosinophils Relative: 7 %
HEMATOCRIT: 27.5 % — AB (ref 36.0–46.0)
HEMOGLOBIN: 9.1 g/dL — AB (ref 12.0–15.0)
Lymphocytes Relative: 20 %
Lymphs Abs: 1 10*3/uL (ref 0.7–4.0)
MCH: 33 pg (ref 26.0–34.0)
MCHC: 33.1 g/dL (ref 30.0–36.0)
MCV: 99.6 fL (ref 78.0–100.0)
Monocytes Absolute: 0.7 10*3/uL (ref 0.1–1.0)
Monocytes Relative: 14 %
Neutro Abs: 2.9 10*3/uL (ref 1.7–7.7)
Neutrophils Relative %: 59 %
Platelets: 38 10*3/uL — ABNORMAL LOW (ref 150–400)
RBC: 2.76 MIL/uL — ABNORMAL LOW (ref 3.87–5.11)
RDW: 18.3 % — ABNORMAL HIGH (ref 11.5–15.5)
WBC: 4.9 10*3/uL (ref 4.0–10.5)

## 2014-12-06 LAB — RENAL FUNCTION PANEL
ALBUMIN: 3.4 g/dL — AB (ref 3.5–5.0)
ANION GAP: 8 (ref 5–15)
BUN: 25 mg/dL — ABNORMAL HIGH (ref 6–20)
CALCIUM: 8 mg/dL — AB (ref 8.9–10.3)
CO2: 19 mmol/L — ABNORMAL LOW (ref 22–32)
Chloride: 114 mmol/L — ABNORMAL HIGH (ref 101–111)
Creatinine, Ser: 2.93 mg/dL — ABNORMAL HIGH (ref 0.44–1.00)
GFR calc non Af Amer: 15 mL/min — ABNORMAL LOW (ref >60)
GFR, EST AFRICAN AMERICAN: 17 mL/min — AB (ref >60)
Glucose, Bld: 102 mg/dL — ABNORMAL HIGH (ref 65–99)
PHOSPHORUS: 3 mg/dL (ref 2.5–4.6)
Potassium: 3.7 mmol/L (ref 3.5–5.1)
SODIUM: 141 mmol/L (ref 135–145)

## 2014-12-06 LAB — HEMOGLOBIN A1C
HEMOGLOBIN A1C: 5.7 % — AB (ref 4.8–5.6)
MEAN PLASMA GLUCOSE: 117 mg/dL

## 2014-12-06 LAB — FOLATE RBC
Folate, Hemolysate: 374.3 ng/mL
Folate, RBC: 1853 ng/mL (ref >498)
Hematocrit: 20.2 % — ABNORMAL LOW (ref 34.0–46.6)

## 2014-12-06 LAB — GLUCOSE, CAPILLARY: GLUCOSE-CAPILLARY: 109 mg/dL — AB (ref 65–99)

## 2014-12-06 NOTE — Clinical Documentation Improvement (Signed)
Hospitalist  Can the diagnosis of CHF be further specified?    Acuity - Acute, Chronic, Acute on Chronic   Type - Systolic, Diastolic, Systolic and Diastolic  Other  Clinically Undetermined   Document any associated diagnoses/conditions   Supporting Information: Patient with CHF, currently compensated per 11/18 progress notes.   Please exercise your independent, professional judgment when responding. A specific answer is not anticipated or expected.   Thank You,  Renick [phone]

## 2014-12-06 NOTE — Care Management Important Message (Signed)
Important Message  Patient Details  Name: Nancy Norris MRN: [id number] Date of Birth: [birth year]   Medicare Important Message Given:  Yes    Camillo Flaming 12/06/2014, 11:44 AMImportant Message  Patient Details  Name: Nancy Norris MRN: [id number] Date of Birth: [birth year]   Medicare Important Message Given:  Yes    Camillo Flaming 12/06/2014, 11:44 AM

## 2014-12-06 NOTE — Progress Notes (Signed)
[facility] PROGRESS NOTE  Nancy Norris NLZ:[id number] DOB: [birth year] DOA: 12/02/2014 PCP: [name], MD  Assessment/Plan: #1 symptomatic anemia Likely secondary to recent chemotherapy and multiple myeloma. Patient had presented with shortness of breath, dizziness, fatigue. Patient with clinical improvement. Patient is status post 2 units packed red blood cells with appropriate response with hemoglobin currently at 9.1 from 6.9 on admission. Patient denies any overt bleeding. FOBT negative. Follow H&H.  #2 thrombocytopenia Likely secondary to recent chemotherapy plus or minus multiple myeloma. Patient denies any overt bleeding. Hematology/oncology was consulted and consultation pending. Platelet function trending back up. Transfuse platelets if platelet count less than 10,000 or patient starts to bleed. Follow.  #3 chronic kidney disease stage IV/metabolic acidosis likely secondary to chronic kidney disease. Stable. Renal function improving and trending down. Continue bicarbonate tablets.   #4 shortness of breath Likely secondary to problem #1. VQ scan was negative for acute PE. Cardiac enzymes minimally elevated and trending down. 2-D echo with a EF of 50-55% with no noted wall motion abnormalities, grade 1 diastolic dysfunction. Improved clinically post transfusion.  #5 elevated troponin Likely demand ischemia secondary to problem #1 as patient was noted to have a hemoglobin of 6.9 on admission. Patient denies any current chest pain. 2-D echo with a EF of 50-55 patient with no noted wall motion abnormalities, grade 1 diastolic dysfunction. Follow for now.  #6 gastroesophageal reflux disease PPI and Pepcid.  #7 hypertension Blood pressure stable. Continue imdur.  #8 B 12 deficiency Continue B-12 IM injections. Outpatient follow-up.  #9 hypothyroidism TSH is 0.123. Decreased  Synthroid to 137 mcg daily. Will need repeat thyroid function studies done in 4-6  weeks.  #10 multiple myeloma Patient receiving chemotherapy treatments. Will need to follow-up with oncology.  #11 history of chronic diastolic CHF Currently stable. Compensated. Continue Imdur. 2-D echo with EF of 50-55% with no wall motion abnormalities.  #12 COPD Stable. Continue Advair and Spiriva.  #13 nausea and emesis Unknown etiology. Get a KUB. Will give an enema. Symptomatic treatment.  #14 prophylaxis Pepcid for GI prophylaxis. SCDs for DVT prophylaxis.   Code Status: Full. Family Communication: Updated patient. No family present. Disposition Plan: Home when nausea and emesis improved.    Consultants:  Hematology/Oncology pending  Procedures:  VQ scan 12/03/2014  2-D echo 12/04/2014 pending  Chest x-ray 12/02/2014  2 units packed red blood cells 12/04/2014  Antibiotics:  None  HPI/Subjective: Patient states shortness of breath has improved. Patient denies any dizziness. Patient c/o cramping in hands. Patient denies bleeding. Patient noted to be nauseous per nursing with some dry heaves and emesis.  Objective: Filed Vitals:   12/05/14 2046 12/06/14 0558  BP: 152/64 141/63  Pulse: 58 61  Temp: 98 F (36.7 C) 98.5 F (36.9 C)  Resp: 18 18    Intake/Output Summary (Last 24 hours) at 12/06/14 1152 Last data filed at 12/06/14 0643  Gross per 24 hour  Intake    360 ml  Output   2100 ml  Net  -1740 ml   Filed Weights   12/04/14 0511 12/05/14 0526 12/06/14 0558  Weight: 71.85 kg (158 lb 6.4 oz) 72.621 kg (160 lb 1.6 oz) 72.802 kg (160 lb 8 oz)    Exam:   General:  NAD  Cardiovascular: RRR  Respiratory: CTAB  Abdomen: Soft, nontender, nondistended, positive bowel sounds.  Musculoskeletal: No clubbing cyanosis or edema.  Data Reviewed: Basic Metabolic Panel:  Recent Labs Lab 12/02/14 2315 12/03/14 0300 12/04/14 0400 12/05/14 0645  12/06/14 0533  NA 140 138 141 139 141  K 3.8 3.5 4.1 3.8 3.7  CL 113* 113* 114* 111 114*  CO2  18* 19* 19* 17* 19*  GLUCOSE 100* 94 96 85 102*  BUN 51* 49* 42* 36* 25*  CREATININE 3.94* 3.76* 3.55* 3.09* 2.93*  CALCIUM 8.9 8.2* 8.3* 8.2* 8.0*  MG  --   --   --  2.2  --   PHOS  --   --   --  3.0 3.0   Liver Function Tests:  Recent Labs Lab 12/03/14 0300 12/05/14 0645 12/06/14 0533  AST 13*  --   --   ALT 27  --   --   ALKPHOS 70  --   --   BILITOT 1.3*  --   --   PROT 5.3*  --   --   ALBUMIN 3.4* 3.5 3.4*   No results for input(s): LIPASE, AMYLASE in the last 168 hours. No results for input(s): AMMONIA in the last 168 hours. CBC:  Recent Labs Lab 12/02/14 2315 12/03/14 0300 12/04/14 0400 12/05/14 0645 12/06/14 0533  WBC 4.8 3.9* 4.9 5.4 4.9  NEUTROABS 3.2  --   --   --  2.9  HGB 7.8* 6.9* 9.6* 9.1* 9.1*  HCT 24.0* 21.6* 28.4* 27.6* 27.5*  MCV 106.7* 106.9* 97.9 98.2 99.6  PLT 54* 41* 28* 30* 38*   Cardiac Enzymes:  Recent Labs Lab 12/03/14 0300 12/03/14 0845 12/03/14 1630  TROPONINI 0.07* 0.06* 0.04*   BNP (last 3 results)  Recent Labs  07/13/14 1125  BNP 968.2*    ProBNP (last 3 results) No results for input(s): PROBNP in the last 8760 hours.  CBG:  Recent Labs Lab 12/03/14 0757 12/04/14 0742 12/05/14 0748 12/06/14 0728  GLUCAP 82 89 80 109*    No results found for this or any previous visit (from the past 240 hour(s)).   Studies: No results found.  Scheduled Meds: . antiseptic oral rinse  7 mL Mouth Rinse q12n4p  . buPROPion  300 mg Oral Daily  . chlorhexidine  15 mL Mouth Rinse BID  . cholecalciferol  4,000 Units Oral Daily  . citalopram  40 mg Oral Daily  . cromolyn  1 drop Both Eyes QID  . cyanocobalamin  1,000 mcg Intramuscular Daily  . cycloSPORINE  1 drop Both Eyes BID  . famotidine  10 mg Oral Daily  . ferrous sulfate  325 mg Oral QODAY  . folic acid  1 mg Oral Daily  . gabapentin  300 mg Oral QHS  . isosorbide mononitrate  30 mg Oral Daily  . levothyroxine  137 mcg Oral QAC breakfast  . loteprednol  1 drop Both  Eyes QID  . midodrine  10 mg Oral TID  . mometasone-formoterol  2 puff Inhalation BID  . multivitamin with minerals  1 tablet Oral Daily  . pantoprazole  40 mg Oral Daily  . pilocarpine  5 mg Oral TID  . senna-docusate  1 tablet Oral QHS  . sodium bicarbonate  650 mg Oral BID  . sodium chloride  10-40 mL Intracatheter Q12H  . sodium chloride  3 mL Intravenous Q12H  . thiamine  100 mg Oral Daily  . tiotropium  18 mcg Inhalation Daily  . vitamin C  250 mg Oral Daily   Continuous Infusions:   Principal Problem:   Symptomatic anemia Active Problems:   Thrombocytopenia (HCC)   Hypothyroid   Chronic diastolic heart failure (HCC)   HTN (hypertension)  GERD (gastroesophageal reflux disease)   Multiple myeloma (HCC)   Dizziness   Malnutrition of moderate degree   B12 deficiency   SOB (shortness of breath)    Time spent: 35 mins    [facility] MD [facility] Pager [phone]. If 7PM-7AM, please contact night-coverage at [email or web address], password [facility] 12/06/2014, 11:52 AM  LOS: 3 days

## 2014-12-06 NOTE — Telephone Encounter (Signed)
Mailed pt's medical records to [facility]

## 2014-12-07 ENCOUNTER — Ambulatory Visit: Payer: Medicare Other

## 2014-12-07 LAB — CBC WITH DIFFERENTIAL/PLATELET
BASOS ABS: 0 10*3/uL (ref 0.0–0.1)
BASOS PCT: 0 %
EOS ABS: 0.3 10*3/uL (ref 0.0–0.7)
Eosinophils Relative: 6 %
HCT: 27.3 % — ABNORMAL LOW (ref 36.0–46.0)
Hemoglobin: 9.1 g/dL — ABNORMAL LOW (ref 12.0–15.0)
Lymphocytes Relative: 18 %
Lymphs Abs: 1 10*3/uL (ref 0.7–4.0)
MCH: 33.5 pg (ref 26.0–34.0)
MCHC: 33.3 g/dL (ref 30.0–36.0)
MCV: 100.4 fL — ABNORMAL HIGH (ref 78.0–100.0)
MONO ABS: 0.8 10*3/uL (ref 0.1–1.0)
Monocytes Relative: 15 %
NEUTROS PCT: 61 %
Neutro Abs: 3.2 10*3/uL (ref 1.7–7.7)
PLATELETS: 53 10*3/uL — AB (ref 150–400)
RBC: 2.72 MIL/uL — AB (ref 3.87–5.11)
RDW: 18.3 % — ABNORMAL HIGH (ref 11.5–15.5)
WBC: 5.3 10*3/uL (ref 4.0–10.5)

## 2014-12-07 LAB — RENAL FUNCTION PANEL
ALBUMIN: 3.5 g/dL (ref 3.5–5.0)
Anion gap: 7 (ref 5–15)
BUN: 24 mg/dL — ABNORMAL HIGH (ref 6–20)
CALCIUM: 8.4 mg/dL — AB (ref 8.9–10.3)
CO2: 19 mmol/L — ABNORMAL LOW (ref 22–32)
CREATININE: 2.74 mg/dL — AB (ref 0.44–1.00)
Chloride: 115 mmol/L — ABNORMAL HIGH (ref 101–111)
GFR, EST AFRICAN AMERICAN: 19 mL/min — AB (ref >60)
GFR, EST NON AFRICAN AMERICAN: 16 mL/min — AB (ref >60)
Glucose, Bld: 105 mg/dL — ABNORMAL HIGH (ref 65–99)
PHOSPHORUS: 3.2 mg/dL (ref 2.5–4.6)
Potassium: 3.8 mmol/L (ref 3.5–5.1)
SODIUM: 141 mmol/L (ref 135–145)

## 2014-12-07 LAB — GLUCOSE, CAPILLARY: GLUCOSE-CAPILLARY: 92 mg/dL (ref 65–99)

## 2014-12-07 MED ORDER — HEPARIN SOD (PORK) LOCK FLUSH 100 UNIT/ML IV SOLN
500.0000 [IU] | INTRAVENOUS | Status: AC | PRN
  Administered 2014-12-07: 500 [IU]

## 2014-12-07 MED ORDER — CYANOCOBALAMIN 1000 MCG/ML IJ SOLN: 1000.0000 ug | Freq: Every day | INTRAMUSCULAR | Status: DC

## 2014-12-07 MED ORDER — CITALOPRAM HYDROBROMIDE 40 MG PO TABS: 20.0000 mg | ORAL_TABLET | Freq: Every day | ORAL | Status: DC

## 2014-12-07 MED ORDER — SODIUM BICARBONATE 650 MG PO TABS: 650.0000 mg | ORAL_TABLET | Freq: Two times a day (BID) | ORAL | Status: DC

## 2014-12-07 NOTE — Discharge Summary (Signed)
Physician Discharge Summary  Nancy Norris ERX:[id number] DOB: 20-Aug-1940 DOA: 12/02/2014  PCP: [name], MD  Admit date: 12/02/2014 Discharge date: 12/07/2014  Time spent: 65 minutes  Recommendations for Outpatient Follow-up:  1. Follow-up with [name], MD in 3 weeks. On follow-up patient will need a renal panel done to follow-up on electrolytes and renal function. Patient will need repeat thyroid function studies done in about 4-6 weeks as a Synthroid dose was decreased during the hospitalization. Patient's vitamin B-12 deficiency would need to be followed up upon. 2. Follow-up with oncology, Dr. Lorna Few as scheduled. 3. Follow-up with Dr. Marval Regal, nephrology as scheduled.   Discharge Diagnoses:  Principal Problem:   Symptomatic anemia Active Problems:   Thrombocytopenia (HCC)   Hypothyroid   Chronic diastolic heart failure (HCC)   HTN (hypertension)   GERD (gastroesophageal reflux disease)   Multiple myeloma (HCC)   Dizziness   Malnutrition of moderate degree   B12 deficiency   SOB (shortness of breath)   Nausea & vomiting   Discharge Condition: Stable and improved  Diet recommendation: Heart healthy  Filed Weights   12/05/14 0526 12/06/14 0558 12/07/14 0623  Weight: 72.621 kg (160 lb 1.6 oz) 72.802 kg (160 lb 8 oz) 72.439 kg (159 lb 11.2 oz)    History of present illness:  Per Dr Baruch Merl is a 74 y.o. female with history of Hypothyroid COPD Dizziness Multiple Myeloma Breast Cancer presented to the ED with dizziness. Patient has also been having increased shortness of breath noted. All these symptoms had been going on for about a week. Patient seemed to be more dizzy when she moved around. Patient had been having more shortness of breath when she walked. She stated that when she gets up she felt weak. She had no chest pain noted but did have pain in the area of her hiatal hernia. She had not actually passed out. She had  no recent travel. Patient did have a history of multiple myeloma and had been undergoing chemotherapy twice a week for this. She had a blood count checked on Monday which was 9.6 and on day of admission, her Hgb was down to 7.8. She had not noted any blood in her stool. She stated that her stools were normal colored. Patient did have a history of constipation which did make her stools hard and dark. Patient stated that she had noted no fevers or chills. She had noted nausea and no vomiting.   Hospital Course:  #1 symptomatic anemia Likely secondary to recent chemotherapy and multiple myeloma. Patient had presented with shortness of breath, dizziness, fatigue. Patient   was transfused2 units packed red blood cells with appropriate response with hemoglobin currently at 9.1 from 6.9 on admission. Patient  improved clinically did not have any overt bleeding FOBT was negative and hemoglobin stabilized. Outpatient follow-up.  #2 thrombocytopenia Likely secondary to recent chemotherapy plus or minus multiple myeloma. Patient denied any overt bleeding during the hospitalization. Patient's platelet function was followed and patient's platelets started to trend back up such that by day of discharge her platelet count was 53.  #3 chronic kidney disease stage IV/metabolic acidosis likely secondary to chronic kidney disease. Stable. Renal function improved and trended down during the hospitalization such that by day of discharge patient's creatinine was down to 2.74. Patient was started on bicarbonate tablets which she'll be discharged on  #4 shortness of breath Likely secondary to problem #1. VQ scan was negative for acute PE. Cardiac enzymes minimally  elevated and trended down. 2-D echo with a EF of 50-55% with no noted wall motion abnormalities, grade 1 diastolic dysfunction. Improved clinically post transfusion.  #5 elevated troponin Likely demand ischemia secondary to problem #1 as patient was noted to have  a hemoglobin of 6.9 on admission. Patient denied any chest pain. 2-D echo with a EF of 50-55 patient with no noted wall motion abnormalities, grade 1 diastolic dysfunction.   #6 gastroesophageal reflux disease PPI and Pepcid.  #7 hypertension Blood pressure remained stable. Patient was maintained on her home regimen of imdur.  #8 B 12 deficiency Patient was noted during the hospitalization to be vitamin B-12 deficient with a B-12 level of 165. Patient was placed on vitamin B-12 injections and will need outpatient follow-up.   #9 hypothyroidism TSH is 0.123. Decreased Synthroid to 137 mcg daily. Will need repeat thyroid function studies done in 4-6 weeks.  #10 multiple myeloma Patient was receiving chemotherapy treatments prior to admission. Outpatient follow-up with oncology.  #11 history of chronic diastolic CHF Remained compensated throughout the hospitalization. Patient was maintained on Imdur. 2-D echo with EF of 50-55% with no wall motion abnormalities.  #12 COPD Stable. Continued on Advair and Spiriva.  #13 nausea and emesis Patient had some nausea and emesis felt to be secondary to constipation. KUB was obtained which was consistent with constipation. Patient was given enemas as well as placed on a bowel regimen with resolution of her constipation as well as nausea and emesis.   Procedures:  VQ scan 12/03/2014  2-D echo 12/04/2014 pending  Chest x-ray 12/02/2014  2 units packed red blood cells 12/04/2014  Consultations:  None  Discharge Exam: Filed Vitals:   12/06/14 2145 12/07/14 0623  BP: 164/60 134/67  Pulse: 67 64  Temp: 97.8 F (36.6 C) 98 F (36.7 C)  Resp: 16 16    General: NAD Cardiovascular: RRR Respiratory: CTAB  Discharge Instructions   Discharge Instructions    Diet general    Complete by:  As directed      Discharge instructions    Complete by:  As directed   Follow up with MCKEOWN,WILLIAM DAVID, MD in 2-3 weeks. Follow up with  oncology as scheduled. Follow up with nephrology as scheduled     Increase activity slowly    Complete by:  As directed           Current Discharge Medication List    START taking these medications   Details  cyanocobalamin (,VITAMIN B-12,) 1000 MCG/ML injection Inject 1 mL (1,000 mcg total) into the muscle daily. Take daily x 2 days, then B12 1000MCG WEEKLY X 1 MONTH, THEN 1000MCG MONTHLY. Qty: 25 mL, Refills: 0    sodium bicarbonate 650 MG tablet Take 1 tablet (650 mg total) by mouth 2 (two) times daily. Qty: 60 tablet, Refills: 0      CONTINUE these medications which have CHANGED   Details  citalopram (CELEXA) 40 MG tablet Take 0.5 tablets (20 mg total) by mouth daily. Qty: 30 tablet, Refills: 0      CONTINUE these medications which have NOT CHANGED   Details  acetaminophen (TYLENOL) 325 MG tablet Take 650 mg by mouth every 6 (six) hours as needed for mild pain or moderate pain.     albuterol (PROVENTIL HFA;VENTOLIN HFA) 108 (90 BASE) MCG/ACT inhaler Inhale 2 puffs into the lungs 2 (two) times daily as needed for wheezing or shortness of breath. Qty: 1 each, Refills: 6    ascorbic acid (VITAMIN C)  250 MG CHEW Chew 250 mg by mouth daily.    buPROPion (WELLBUTRIN XL) 300 MG 24 hr tablet Take 300 mg by mouth daily.  Refills: 98    carboxymethylcellulose (REFRESH PLUS) 0.5 % SOLN Place 1 drop into both eyes every 2 (two) hours.    Cholecalciferol 4000 UNITS TABS Take 4,000 Units by mouth daily.    cromolyn (OPTICROM) 4 % ophthalmic solution Place 1 drop into both eyes 4 (four) times daily.   Associated Diagnoses: Multiple myeloma in remission (HCC)    esomeprazole (NEXIUM) 40 MG capsule TAKE 1 CAPSULE BY MOUTH DAILY, EACH MORNING FOR ACID INDIGESTION Qty: 30 capsule, Refills: PRN    Ferrous Sulfate 134 MG TABS Take 1 tablet (134 mg total) by mouth every other day. Qty: 60 tablet, Refills: 2    Fluticasone-Salmeterol (ADVAIR) 100-50 MCG/DOSE AEPB Inhale 1 puff into the  lungs 2 (two) times daily. Qty: 60 each, Refills: 3    gabapentin (NEURONTIN) 300 MG capsule TAKE 1 CAPSULE BY MOUTH AT BEDTIME. Qty: 90 capsule, Refills: 3    isosorbide mononitrate (IMDUR) 30 MG 24 hr tablet TAKE 1 TABLET BY MOUTH DAILY. Qty: 30 tablet, Refills: 11    levothyroxine (SYNTHROID, LEVOTHROID) 150 MCG tablet TAKE 1 TABLET BY MOUTH ONCE DAILY. Qty: 90 tablet, Refills: 1    loratadine (CLARITIN) 10 MG tablet Take 10 mg by mouth daily.    LORazepam (ATIVAN) 0.5 MG tablet Take 1 tablet (0.5 mg total) by mouth every 8 (eight) hours as needed (nausea). Qty: 30 tablet, Refills: 0   Associated Diagnoses: Nausea without vomiting    loteprednol (ALREX) 0.2 % SUSP Place 1 drop into both eyes 4 (four) times daily. Qty: 1 Bottle, Refills: PRN    meclizine (ANTIVERT) 32 MG tablet Take 1 tablet (32 mg total) by mouth 3 (three) times daily as needed. Qty: 30 tablet, Refills: 0    midodrine (PROAMATINE) 10 MG tablet Take 1 tablet (10 mg total) by mouth 3 (three) times daily. Qty: 90 tablet, Refills: 11    ondansetron (ZOFRAN-ODT) 8 MG disintegrating tablet Take 1 tablet (8 mg total) by mouth every 8 (eight) hours as needed for nausea or vomiting. Qty: 20 tablet, Refills: 3    pilocarpine (SALAGEN) 5 MG tablet TAKE 1 TABLET BY MOUTH 3 TIMES DAILY. Qty: 90 tablet, Refills: 0    prochlorperazine (COMPAZINE) 10 MG tablet TAKE 1 TABLET BY MOUTH EVERY 6 HOURS AS NEEDED FOR NAUSEA OR VOMITING. Qty: 60 tablet, Refills: 0    ranitidine (ZANTAC) 300 MG tablet TAKE 1 TABLET BY MOUTH AT BEDTIME, FOR ACID REFLUX AND INDIGESTION. Qty: 30 tablet, Refills: PRN    RESTASIS 0.05 % ophthalmic emulsion Place 1 drop into both eyes 2 (two) times daily.  Refills: 4    senna (SENOKOT) 8.6 MG tablet Take 1 tablet by mouth at bedtime.    dexamethasone (DECADRON) 4 MG tablet Take 10 tablets by mouth once a week as directed Qty: 40 tablet, Refills: 3    hydrOXYzine (ATARAX/VISTARIL) 25 MG tablet Take  1 tablet by mouth 3 (three) times daily as needed for vomiting. anxiety Refills: 1    loperamide (IMODIUM) 2 MG capsule Take two tabs po initially, then one tab after each loose stool: max 8 tabs in 24 hours Qty: 30 capsule, Refills: 0    nitroGLYCERIN (NITROSTAT) 0.4 MG SL tablet Place 1 tablet (0.4 mg total) under the tongue every 5 (five) minutes as needed for chest pain. Qty: 25 tablet, Refills: 3  Associated Diagnoses: Chest pain, unspecified    nystatin (MYCOSTATIN/NYSTOP) 100000 UNIT/GM POWD Apply 1 g topically 2 (two) times daily. Apply to belly      STOP taking these medications     aspirin EC 81 MG tablet      furosemide (LASIX) 40 MG tablet        Allergies  Allergen Reactions  . Codeine Anaphylaxis  . Latex Shortness Of Breath    Adhesive products   . Other Other (See Comments)    Onion, chocolate causes migraines  . Onion Other (See Comments)    Causes migraine headaches  . Zyprexa [Olanzapine] Other (See Comments)    Confusion , dizzy,unsteady  . Adhesive [Tape] Other (See Comments)    blisters  . Iodinated Diagnostic Agents Itching    Happened 60 years ago  . Sulfa Antibiotics Itching   Follow-up Information    Follow up with MCKEOWN,WILLIAM DAVID, MD. Schedule an appointment as soon as possible for a visit in 3 weeks.   Specialty:  Internal Medicine   Contact information:   [address] [place] [address] [phone]       Follow up with [name]., MD.   Specialty:  Oncology   Why:  f/u as scheduled.   Contact information:   [place] [address] [phone]       Follow up with Donetta Potts, MD.   Specialty:  Nephrology   Why:  f/u as scheduled.   Contact information:   [place] [address] [phone]        The results of significant diagnostics from this hospitalization (including imaging, microbiology, ancillary and laboratory) are listed below for  reference.    Significant Diagnostic Studies: Dg Chest 2 View  12/02/2014  CLINICAL DATA:  Acute onset of shortness of breath and dizziness. Initial encounter. EXAM: CHEST  2 VIEW COMPARISON:  Chest radiograph performed 07/13/2014 FINDINGS: The lungs are well-aerated. Pulmonary vascularity is at the upper limits of normal. There is no evidence of focal opacification, pleural effusion or pneumothorax. The heart is borderline normal in size. A right-sided chest port is noted ending about the distal SVC. No acute osseous abnormalities are seen. A clip is noted overlying the left breast. IMPRESSION: No acute cardiopulmonary process seen. Electronically Signed   By: Garald Balding M.D.   On: 12/02/2014 23:16   Dg Abd 1 View  12/06/2014  CLINICAL DATA:  Nausea and vomiting EXAM: ABDOMEN - 1 VIEW COMPARISON:  None. FINDINGS: Scattered large and small bowel gas is noted. Fecal material is noted throughout the colon without obstructive change or dilatation. Small right-sided pleural effusion is seen. No acute bony abnormality is noted. IMPRESSION: No acute abnormality seen in the abdomen. A small right-sided pleural effusion is noted. Electronically Signed   By: Inez Catalina M.D.   On: 12/06/2014 15:04   Nm Pulmonary Perf And Vent  12/03/2014  CLINICAL DATA:  Shortness of breath for 1 week. History of COPD of multiple myeloma. Initial encounter. EXAM: NUCLEAR MEDICINE VENTILATION - PERFUSION LUNG SCAN TECHNIQUE: Ventilation images were obtained in multiple projections using inhaled aerosol Tc-20m DTPA. Perfusion images were obtained in multiple projections after intravenous injection of Tc-68m MAA. RADIOPHARMACEUTICALS:  67.7 millicuries [id number] DTPA aerosol inhalation and 4.2 millicuries [id number] MAA IV COMPARISON:  None. FINDINGS: Ventilation: No focal ventilation defect. Perfusion: No wedge shaped peripheral perfusion defects to suggest acute pulmonary embolism. IMPRESSION: Negative exam.  Electronically Signed   By: Marcello Moores  Dalessio M.D.   On: 12/03/2014 11:10    Microbiology: No results found for this or any previous visit (from the past 240 hour(s)).   Labs: Basic Metabolic Panel:  Recent Labs Lab 12/03/14 0300 12/04/14 0400 12/05/14 0645 12/06/14 0533 12/07/14 0515  NA 138 141 139 141 141  K 3.5 4.1 3.8 3.7 3.8  CL 113* 114* 111 114* 115*  CO2 19* 19* 17* 19* 19*  GLUCOSE 94 96 85 102* 105*  BUN 49* 42* 36* 25* 24*  CREATININE 3.76* 3.55* 3.09* 2.93* 2.74*  CALCIUM 8.2* 8.3* 8.2* 8.0* 8.4*  MG  --   --  2.2  --   --   PHOS  --   --  3.0 3.0 3.2   Liver Function Tests:  Recent Labs Lab 12/03/14 0300 12/05/14 0645 12/06/14 0533 12/07/14 0515  AST 13*  --   --   --   ALT 27  --   --   --   ALKPHOS 70  --   --   --   BILITOT 1.3*  --   --   --   PROT 5.3*  --   --   --   ALBUMIN 3.4* 3.5 3.4* 3.5   No results for input(s): LIPASE, AMYLASE in the last 168 hours. No results for input(s): AMMONIA in the last 168 hours. CBC:  Recent Labs Lab 12/02/14 2315 12/03/14 0300 12/04/14 0400 12/05/14 0645 12/06/14 0533 12/07/14 0515  WBC 4.8 3.9* 4.9 5.4 4.9 5.3  NEUTROABS 3.2  --   --   --  2.9 3.2  HGB 7.8* 6.9* 9.6* 9.1* 9.1* 9.1*  HCT 24.0* 21.6*  20.2* 28.4* 27.6* 27.5* 27.3*  MCV 106.7* 106.9* 97.9 98.2 99.6 100.4*  PLT 54* 41* 28* 30* 38* 53*   Cardiac Enzymes:  Recent Labs Lab 12/03/14 0300 12/03/14 0845 12/03/14 1630  TROPONINI 0.07* 0.06* 0.04*   BNP: BNP (last 3 results)  Recent Labs  07/13/14 1125  BNP 968.2*    ProBNP (last 3 results) No results for input(s): PROBNP in the last 8760 hours.  CBG:  Recent Labs Lab 12/03/14 0757 12/04/14 0742 12/05/14 0748 12/06/14 0728 12/07/14 0800  GLUCAP 82 89 80 109* 92       Signed:  Pola Furno MD [facility] 12/07/2014, 12:21 PM

## 2014-12-07 NOTE — Progress Notes (Signed)
Completed  D/C teaching with patient. Answered all questions. Pt will be D.C home with family in stable condition.

## 2014-12-07 NOTE — Progress Notes (Signed)
Nutrition Follow-up  DOCUMENTATION CODES:   Non-severe (moderate) malnutrition in context of chronic illness  INTERVENTION:  - RD will continue to monitor for needs  NUTRITION DIAGNOSIS:   Increased nutrient needs related to catabolic illness, cancer and cancer related treatments as evidenced by estimated needs. -ongoing  GOAL:   Patient will meet greater than or equal to 90% of their needs -met on average  MONITOR:   PO intake, Weight trends, Labs, I & O's  ASSESSMENT:   74 y.o. female with history of Hypothyroid COPD Dizziness Multiple Myeloma Breast Cancer presented to the ED with dizziness. Patient has also been having increased shortness of breath noted. All these symptoms have been going on for about a week. Patient seems to be more dizzy when she moves around. Patient has been having more shortness of breath when she walks. She states that when she gets up she feels weak. She has no chest pain noted but does have pain in the area of her hiatal hernia. She has not actually passed out. She has no recent travel. Patient does have a history of multiple myeloma and has been undergoing chemotherapy twice a week for this. She had a blood count checked on Monday which was 9.6 and now her Hgb is down to 7.8. She has not noted any blood in her stool. She states that her stools are normal colored. Patient does have a history of constipation which does make her stools hard and dark. Patient states that she has noted no fevers or chills. She has had noted nausea and no vomiting.  Per chart review, pt ate 0% of breakfast and 75% of lunch on 11/19 and on 11/20 she ate 100% of breakfast and 60% of both lunch and dinner. Pt states she is getting ready to order breakfast this AM. She denies any nausea or abdominal pain with eating the past few days and that she is feeling much better overall. Appetite greatly improved.  Meeting needs on average. Medications reviewed. Labs reviewed; Cl: 115 mmol/L,  BUN/creatinine elevated but trending down, Ca: 8.4 mg/dL, GFR: 16.   Diet Order:  Diet regular Room service appropriate?: Yes; Fluid consistency:: Thin  Skin:  Reviewed, no issues  Last BM:  11/21  Height:   Ht Readings from Last 1 Encounters:  12/03/14 5' 5.5" (1.664 m)    Weight:   Wt Readings from Last 1 Encounters:  12/07/14 159 lb 11.2 oz (72.439 kg)    Ideal Body Weight:  57.95 kg (kg)  BMI:  Body mass index is 26.16 kg/(m^2).  Estimated Nutritional Needs:   Kcal:  2150-2300  Protein:  70-80 grams  Fluid:  1.2 L/day per MD order  EDUCATION NEEDS:   No education needs identified at this time      Jarome Matin, RD, LDN Inpatient Clinical Dietitian Pager # [phone] After hours/weekend pager # [phone]

## 2014-12-13 ENCOUNTER — Ambulatory Visit (HOSPITAL_BASED_OUTPATIENT_CLINIC_OR_DEPARTMENT_OTHER): Payer: Medicare Other

## 2014-12-13 ENCOUNTER — Other Ambulatory Visit (HOSPITAL_BASED_OUTPATIENT_CLINIC_OR_DEPARTMENT_OTHER): Payer: Medicare Other

## 2014-12-13 VITALS — BP 106/52 | HR 70 | Temp 98.3°F | Resp 18

## 2014-12-13 DIAGNOSIS — C9000 Multiple myeloma not having achieved remission: Secondary | ICD-10-CM | POA: Diagnosis not present

## 2014-12-13 DIAGNOSIS — Z5112 Encounter for antineoplastic immunotherapy: Secondary | ICD-10-CM

## 2014-12-13 DIAGNOSIS — Z5111 Encounter for antineoplastic chemotherapy: Secondary | ICD-10-CM

## 2014-12-13 LAB — CBC WITH DIFFERENTIAL/PLATELET
BASO%: 0.6 % (ref 0.0–2.0)
BASOS ABS: 0 10*3/uL (ref 0.0–0.1)
EOS%: 5.5 % (ref 0.0–7.0)
Eosinophils Absolute: 0.3 10*3/uL (ref 0.0–0.5)
HCT: 32.5 % — ABNORMAL LOW (ref 34.8–46.6)
HEMOGLOBIN: 10.3 g/dL — AB (ref 11.6–15.9)
LYMPH%: 23.7 % (ref 14.0–49.7)
MCH: 33.1 pg (ref 25.1–34.0)
MCHC: 31.7 g/dL (ref 31.5–36.0)
MCV: 104.5 fL — ABNORMAL HIGH (ref 79.5–101.0)
MONO#: 0.9 10*3/uL (ref 0.1–0.9)
MONO%: 18 % — AB (ref 0.0–14.0)
NEUT#: 2.6 10*3/uL (ref 1.5–6.5)
NEUT%: 52.2 % (ref 38.4–76.8)
Platelets: 92 10*3/uL — ABNORMAL LOW (ref 145–400)
RBC: 3.11 10*6/uL — AB (ref 3.70–5.45)
RDW: 19.1 % — AB (ref 11.2–14.5)
WBC: 4.9 10*3/uL (ref 3.9–10.3)
lymph#: 1.2 10*3/uL (ref 0.9–3.3)

## 2014-12-13 LAB — COMPREHENSIVE METABOLIC PANEL (CC13)
ALT: 16 U/L (ref 0–55)
AST: 16 U/L (ref 5–34)
Albumin: 3.7 g/dL (ref 3.5–5.0)
Alkaline Phosphatase: 94 U/L (ref 40–150)
Anion Gap: 9 mEq/L (ref 3–11)
BUN: 36.5 mg/dL — AB (ref 7.0–26.0)
CHLORIDE: 112 meq/L — AB (ref 98–109)
CO2: 21 meq/L — AB (ref 22–29)
Calcium: 9.9 mg/dL (ref 8.4–10.4)
Creatinine: 3.3 mg/dL (ref 0.6–1.1)
EGFR: 13 mL/min/{1.73_m2} — ABNORMAL LOW (ref >90)
GLUCOSE: 101 mg/dL (ref 70–140)
POTASSIUM: 4.3 meq/L (ref 3.5–5.1)
SODIUM: 142 meq/L (ref 136–145)
Total Bilirubin: 1.31 mg/dL — ABNORMAL HIGH (ref 0.20–1.20)
Total Protein: 6.2 g/dL — ABNORMAL LOW (ref 6.4–8.3)

## 2014-12-13 LAB — TECHNOLOGIST REVIEW

## 2014-12-13 MED ORDER — SODIUM CHLORIDE 0.9 % IV SOLN: Freq: Once | INTRAVENOUS | Status: DC

## 2014-12-13 MED ORDER — SODIUM CHLORIDE 0.9 % IV SOLN
Freq: Once | INTRAVENOUS | Status: AC
  Administered 2014-12-13: 12:00:00 via INTRAVENOUS

## 2014-12-13 MED ORDER — SODIUM CHLORIDE 0.9 % IJ SOLN
10.0000 mL | INTRAMUSCULAR | Status: DC | PRN
  Administered 2014-12-13: 10 mL
  Filled 2014-12-13: qty 10

## 2014-12-13 MED ORDER — HEPARIN SOD (PORK) LOCK FLUSH 100 UNIT/ML IV SOLN
500.0000 [IU] | Freq: Once | INTRAVENOUS | Status: AC | PRN
  Administered 2014-12-13: 500 [IU]
  Filled 2014-12-13: qty 5

## 2014-12-13 MED ORDER — CYCLOPHOSPHAMIDE CHEMO INJECTION 1 GM
300.0000 mg/m2 | Freq: Once | INTRAMUSCULAR | Status: AC
  Administered 2014-12-13: 560 mg via INTRAVENOUS
  Filled 2014-12-13: qty 28

## 2014-12-13 MED ORDER — SODIUM CHLORIDE 0.9 % IV SOLN
Freq: Once | INTRAVENOUS | Status: AC
  Administered 2014-12-13: 13:00:00 via INTRAVENOUS
  Filled 2014-12-13: qty 4

## 2014-12-13 MED ORDER — DEXTROSE 5 % IV SOLN
37.0000 mg/m2 | Freq: Once | INTRAVENOUS | Status: AC
  Administered 2014-12-13: 70 mg via INTRAVENOUS
  Filled 2014-12-13: qty 35

## 2014-12-13 NOTE — Progress Notes (Signed)
Ok to treat today per Dr. Julien Nordmann w/ today's CBC and CMET results.  NOtified him of Platelet count,  Creatinine, and Bilirubin specifically.

## 2014-12-13 NOTE — Patient Instructions (Signed)
[facility] Discharge Instructions for Patients Receiving Chemotherapy  Today you received the following chemotherapy agents Cytoxan and Kyprolis  To help prevent nausea and vomiting after your treatment, we encourage you to take your nausea medication as directed   If you develop nausea and vomiting that is not controlled by your nausea medication, call the clinic.   BELOW ARE SYMPTOMS THAT SHOULD BE REPORTED IMMEDIATELY:  *FEVER GREATER THAN 100.5 F  *CHILLS WITH OR WITHOUT FEVER  NAUSEA AND VOMITING THAT IS NOT CONTROLLED WITH YOUR NAUSEA MEDICATION  *UNUSUAL SHORTNESS OF BREATH  *UNUSUAL BRUISING OR BLEEDING  TENDERNESS IN MOUTH AND THROAT WITH OR WITHOUT PRESENCE OF ULCERS  *URINARY PROBLEMS  *BOWEL PROBLEMS  UNUSUAL RASH Items with * indicate a potential emergency and should be followed up as soon as possible.  Feel free to call the clinic you have any questions or concerns. The clinic phone number is [phone].  Please show the CHEMO ALERT CARD at check-in to the Emergency Department and triage nurse.   

## 2014-12-14 ENCOUNTER — Ambulatory Visit (HOSPITAL_BASED_OUTPATIENT_CLINIC_OR_DEPARTMENT_OTHER): Payer: Medicare Other

## 2014-12-14 ENCOUNTER — Ambulatory Visit: Payer: Medicare Other

## 2014-12-14 VITALS — BP 100/41 | HR 79 | Temp 98.1°F | Resp 20

## 2014-12-14 DIAGNOSIS — C9000 Multiple myeloma not having achieved remission: Secondary | ICD-10-CM

## 2014-12-14 DIAGNOSIS — Z5112 Encounter for antineoplastic immunotherapy: Secondary | ICD-10-CM | POA: Diagnosis not present

## 2014-12-14 MED ORDER — SODIUM CHLORIDE 0.9 % IV SOLN
Freq: Once | INTRAVENOUS | Status: AC
  Administered 2014-12-14: 13:00:00 via INTRAVENOUS

## 2014-12-14 MED ORDER — DEXTROSE 5 % IV SOLN
37.0000 mg/m2 | Freq: Once | INTRAVENOUS | Status: AC
  Administered 2014-12-14: 70 mg via INTRAVENOUS
  Filled 2014-12-14: qty 35

## 2014-12-14 MED ORDER — SODIUM CHLORIDE 0.9 % IJ SOLN
10.0000 mL | INTRAMUSCULAR | Status: DC | PRN
  Administered 2014-12-14: 10 mL
  Filled 2014-12-14: qty 10

## 2014-12-14 MED ORDER — HEPARIN SOD (PORK) LOCK FLUSH 100 UNIT/ML IV SOLN
500.0000 [IU] | Freq: Once | INTRAVENOUS | Status: DC | PRN
  Filled 2014-12-14: qty 5

## 2014-12-14 MED ORDER — SODIUM CHLORIDE 0.9 % IV SOLN
Freq: Once | INTRAVENOUS | Status: AC
  Administered 2014-12-14: 13:00:00 via INTRAVENOUS
  Filled 2014-12-14: qty 4

## 2014-12-14 NOTE — Patient Instructions (Signed)
[facility] Discharge Instructions for Patients Receiving Chemotherapy  Today you received the following chemotherapy agents kyprolis  To help prevent nausea and vomiting after your treatment, we encourage you to take your nausea medication as directed   If you develop nausea and vomiting that is not controlled by your nausea medication, call the clinic.   BELOW ARE SYMPTOMS THAT SHOULD BE REPORTED IMMEDIATELY:  *FEVER GREATER THAN 100.5 F  *CHILLS WITH OR WITHOUT FEVER  NAUSEA AND VOMITING THAT IS NOT CONTROLLED WITH YOUR NAUSEA MEDICATION  *UNUSUAL SHORTNESS OF BREATH  *UNUSUAL BRUISING OR BLEEDING  TENDERNESS IN MOUTH AND THROAT WITH OR WITHOUT PRESENCE OF ULCERS  *URINARY PROBLEMS  *BOWEL PROBLEMS  UNUSUAL RASH Items with * indicate a potential emergency and should be followed up as soon as possible.  Feel free to call the clinic you have any questions or concerns. The clinic phone number is [phone].  

## 2014-12-16 ENCOUNTER — Telehealth: Payer: Self-pay | Admitting: Internal Medicine

## 2014-12-16 NOTE — Telephone Encounter (Signed)
Ok with me 

## 2014-12-16 NOTE — Telephone Encounter (Signed)
Pt called in and would like to transfer from [facility] to Dr Quay Burow here on [name] due to location.  Is this ok with both of you?     Best number [phone]

## 2014-12-20 ENCOUNTER — Other Ambulatory Visit: Payer: Medicare Other

## 2014-12-20 ENCOUNTER — Other Ambulatory Visit: Payer: Self-pay | Admitting: Internal Medicine

## 2014-12-22 DIAGNOSIS — R079 Chest pain, unspecified: Secondary | ICD-10-CM | POA: Diagnosis not present

## 2014-12-22 DIAGNOSIS — I5030 Unspecified diastolic (congestive) heart failure: Secondary | ICD-10-CM | POA: Diagnosis not present

## 2014-12-22 DIAGNOSIS — I770 Arteriovenous fistula, acquired: Secondary | ICD-10-CM | POA: Diagnosis not present

## 2014-12-22 DIAGNOSIS — N2581 Secondary hyperparathyroidism of renal origin: Secondary | ICD-10-CM | POA: Diagnosis not present

## 2014-12-22 DIAGNOSIS — D630 Anemia in neoplastic disease: Secondary | ICD-10-CM | POA: Diagnosis not present

## 2014-12-22 DIAGNOSIS — I951 Orthostatic hypotension: Secondary | ICD-10-CM | POA: Diagnosis not present

## 2014-12-22 DIAGNOSIS — R809 Proteinuria, unspecified: Secondary | ICD-10-CM | POA: Diagnosis not present

## 2014-12-22 DIAGNOSIS — C9000 Multiple myeloma not having achieved remission: Secondary | ICD-10-CM | POA: Diagnosis not present

## 2014-12-22 DIAGNOSIS — N184 Chronic kidney disease, stage 4 (severe): Secondary | ICD-10-CM | POA: Diagnosis not present

## 2014-12-23 DIAGNOSIS — H43813 Vitreous degeneration, bilateral: Secondary | ICD-10-CM | POA: Diagnosis not present

## 2014-12-23 DIAGNOSIS — H353131 Nonexudative age-related macular degeneration, bilateral, early dry stage: Secondary | ICD-10-CM | POA: Diagnosis not present

## 2014-12-27 ENCOUNTER — Ambulatory Visit (HOSPITAL_COMMUNITY)
Admission: RE | Admit: 2014-12-27 | Discharge: 2014-12-27 | Disposition: A | Discharge status: Home / Self Care | Payer: Medicare Other | Source: Ambulatory Visit | Attending: Internal Medicine | Admitting: Internal Medicine

## 2014-12-27 ENCOUNTER — Ambulatory Visit (HOSPITAL_BASED_OUTPATIENT_CLINIC_OR_DEPARTMENT_OTHER): Payer: Medicare Other | Admitting: Internal Medicine

## 2014-12-27 ENCOUNTER — Encounter: Payer: Self-pay | Admitting: Internal Medicine

## 2014-12-27 ENCOUNTER — Other Ambulatory Visit: Payer: Self-pay | Admitting: Medical Oncology

## 2014-12-27 ENCOUNTER — Ambulatory Visit (HOSPITAL_BASED_OUTPATIENT_CLINIC_OR_DEPARTMENT_OTHER): Payer: Medicare Other

## 2014-12-27 ENCOUNTER — Other Ambulatory Visit (HOSPITAL_BASED_OUTPATIENT_CLINIC_OR_DEPARTMENT_OTHER): Payer: Medicare Other

## 2014-12-27 VITALS — BP 123/52 | HR 79 | Temp 98.8°F | Resp 18

## 2014-12-27 VITALS — BP 120/44 | HR 80 | Temp 98.3°F | Resp 18 | Ht 65.5 in | Wt 159.8 lb

## 2014-12-27 DIAGNOSIS — N289 Disorder of kidney and ureter, unspecified: Secondary | ICD-10-CM

## 2014-12-27 DIAGNOSIS — C9000 Multiple myeloma not having achieved remission: Secondary | ICD-10-CM

## 2014-12-27 DIAGNOSIS — Z5111 Encounter for antineoplastic chemotherapy: Secondary | ICD-10-CM | POA: Diagnosis not present

## 2014-12-27 DIAGNOSIS — D649 Anemia, unspecified: Secondary | ICD-10-CM

## 2014-12-27 DIAGNOSIS — D638 Anemia in other chronic diseases classified elsewhere: Secondary | ICD-10-CM

## 2014-12-27 DIAGNOSIS — Z5112 Encounter for antineoplastic immunotherapy: Secondary | ICD-10-CM | POA: Diagnosis not present

## 2014-12-27 DIAGNOSIS — N189 Chronic kidney disease, unspecified: Secondary | ICD-10-CM | POA: Diagnosis not present

## 2014-12-27 DIAGNOSIS — R530 Neoplastic (malignant) related fatigue: Secondary | ICD-10-CM

## 2014-12-27 LAB — CBC WITH DIFFERENTIAL/PLATELET
BASO%: 0.5 % (ref 0.0–2.0)
BASOS ABS: 0 10*3/uL (ref 0.0–0.1)
EOS%: 7.2 % — AB (ref 0.0–7.0)
Eosinophils Absolute: 0.3 10*3/uL (ref 0.0–0.5)
HEMATOCRIT: 23.9 % — AB (ref 34.8–46.6)
HEMOGLOBIN: 7.6 g/dL — AB (ref 11.6–15.9)
LYMPH#: 1.1 10*3/uL (ref 0.9–3.3)
LYMPH%: 27.6 % (ref 14.0–49.7)
MCH: 33.8 pg (ref 25.1–34.0)
MCHC: 31.8 g/dL (ref 31.5–36.0)
MCV: 106.2 fL — ABNORMAL HIGH (ref 79.5–101.0)
MONO#: 0.6 10*3/uL (ref 0.1–0.9)
MONO%: 14.2 % — ABNORMAL HIGH (ref 0.0–14.0)
NEUT#: 2 10*3/uL (ref 1.5–6.5)
NEUT%: 50.5 % (ref 38.4–76.8)
PLATELETS: 115 10*3/uL — AB (ref 145–400)
RBC: 2.25 10*6/uL — ABNORMAL LOW (ref 3.70–5.45)
RDW: 20 % — AB (ref 11.2–14.5)
WBC: 4 10*3/uL (ref 3.9–10.3)

## 2014-12-27 LAB — COMPREHENSIVE METABOLIC PANEL
ALBUMIN: 3.8 g/dL (ref 3.5–5.0)
ALK PHOS: 100 U/L (ref 40–150)
ALT: 20 U/L (ref 0–55)
ANION GAP: 11 meq/L (ref 3–11)
AST: 17 U/L (ref 5–34)
BUN: 35.5 mg/dL — ABNORMAL HIGH (ref 7.0–26.0)
CO2: 21 mEq/L — ABNORMAL LOW (ref 22–29)
Calcium: 9 mg/dL (ref 8.4–10.4)
Chloride: 109 mEq/L (ref 98–109)
Creatinine: 3.9 mg/dL (ref 0.6–1.1)
EGFR: 11 mL/min/{1.73_m2} — AB (ref >90)
GLUCOSE: 93 mg/dL (ref 70–140)
POTASSIUM: 3.8 meq/L (ref 3.5–5.1)
SODIUM: 141 meq/L (ref 136–145)
Total Bilirubin: 0.96 mg/dL (ref 0.20–1.20)
Total Protein: 5.9 g/dL — ABNORMAL LOW (ref 6.4–8.3)

## 2014-12-27 LAB — LACTATE DEHYDROGENASE: LDH: 202 U/L (ref 125–245)

## 2014-12-27 LAB — PREPARE RBC (CROSSMATCH)

## 2014-12-27 MED ORDER — ACETAMINOPHEN 325 MG PO TABS
ORAL_TABLET | ORAL | Status: AC
  Filled 2014-12-27: qty 2

## 2014-12-27 MED ORDER — SODIUM CHLORIDE 0.9 % IV SOLN
Freq: Once | INTRAVENOUS | Status: AC
  Administered 2014-12-27: 13:00:00 via INTRAVENOUS
  Filled 2014-12-27: qty 4

## 2014-12-27 MED ORDER — CYCLOPHOSPHAMIDE CHEMO INJECTION 1 GM
300.0000 mg/m2 | Freq: Once | INTRAMUSCULAR | Status: AC
  Administered 2014-12-27: 560 mg via INTRAVENOUS
  Filled 2014-12-27: qty 28

## 2014-12-27 MED ORDER — SODIUM CHLORIDE 0.9 % IJ SOLN
10.0000 mL | INTRAMUSCULAR | Status: DC | PRN
  Administered 2014-12-27: 10 mL
  Filled 2014-12-27: qty 10

## 2014-12-27 MED ORDER — HEPARIN SOD (PORK) LOCK FLUSH 100 UNIT/ML IV SOLN
500.0000 [IU] | Freq: Once | INTRAVENOUS | Status: AC | PRN
  Administered 2014-12-27: 500 [IU]
  Filled 2014-12-27: qty 5

## 2014-12-27 MED ORDER — DIPHENHYDRAMINE HCL 25 MG PO CAPS
ORAL_CAPSULE | ORAL | Status: AC
  Filled 2014-12-27: qty 1

## 2014-12-27 MED ORDER — ACETAMINOPHEN 325 MG PO TABS
650.0000 mg | ORAL_TABLET | Freq: Once | ORAL | Status: AC
  Administered 2014-12-27: 650 mg via ORAL

## 2014-12-27 MED ORDER — HEPARIN SOD (PORK) LOCK FLUSH 100 UNIT/ML IV SOLN
500.0000 [IU] | Freq: Every day | INTRAVENOUS | Status: DC | PRN
  Filled 2014-12-27: qty 5

## 2014-12-27 MED ORDER — DIPHENHYDRAMINE HCL 25 MG PO CAPS
25.0000 mg | ORAL_CAPSULE | Freq: Once | ORAL | Status: AC
  Administered 2014-12-27: 25 mg via ORAL

## 2014-12-27 MED ORDER — DEXTROSE 5 % IV SOLN
37.0000 mg/m2 | Freq: Once | INTRAVENOUS | Status: AC
  Administered 2014-12-27: 70 mg via INTRAVENOUS
  Filled 2014-12-27: qty 35

## 2014-12-27 MED ORDER — SODIUM CHLORIDE 0.9 % IV SOLN: Freq: Once | INTRAVENOUS | Status: DC

## 2014-12-27 MED ORDER — HEPARIN SOD (PORK) LOCK FLUSH 100 UNIT/ML IV SOLN
500.0000 [IU] | Freq: Every day | INTRAVENOUS | Status: DC | PRN
Start: 2014-12-27 — End: 2014-12-27
  Filled 2014-12-27: qty 5

## 2014-12-27 MED ORDER — SODIUM CHLORIDE 0.9 % IV SOLN
Freq: Once | INTRAVENOUS | Status: AC
  Administered 2014-12-27: 13:00:00 via INTRAVENOUS

## 2014-12-27 MED ORDER — SODIUM CHLORIDE 0.9 % IJ SOLN
10.0000 mL | INTRAMUSCULAR | Status: DC | PRN
  Filled 2014-12-27: qty 10

## 2014-12-27 MED ORDER — SODIUM CHLORIDE 0.9 % IV SOLN: 250.0000 mL | Freq: Once | INTRAVENOUS | Status: DC

## 2014-12-27 NOTE — Progress Notes (Signed)
[facility] Telephone:([phone]) [phone]   [phone]  OFFICE PROGRESS NOTE  No primary care provider on file. No primary provider on file.  PRINCIPAL DIAGNOSES:  1. Recurrent multiple myeloma initially diagnosed in 2002, initially with smoldering myeloma at [facility].  2. Ductal carcinoma in situ status post mastectomy with sentinel lymph node biopsy in October 2008.  PRIOR THERAPY:  1. Status post treatment with tamoxifen from November 2008 through February 2009, discontinued secondary to intolerance. 2. Status post 3 cycles of chemotherapy with Revlimid and Decadron followed by 1 cycle of Decadron only with mild response. 3. Status post 2 cycles of systemic chemotherapy with Velcade, Doxil and Decadron discontinued secondary to significant peripheral neuropathy. Last dose was given May 2010 at [facility]. 4. Status post autologous peripheral blood stem cell transplant on October 01, 2008 at [facility] under the care of Dr. Phyllis Ginger.  5. Systemic chemotherapy with Carfilzomib initially was 20 mg/M2 and will be increased after cycle #1 to 36 mg/M2 on days 1, 2, 8, 9, 15 and 16 every 4 weeks in addition to Cytoxan 300 mg/M2 and Decadron 40 mg by mouth weekly basis, status post 4 cycles. First cycle on 12/29/2012.   CURRENT THERAPY: She resumed chemotherapy with Carfilzomib, Cytoxan, and Decadron on 05/03/2014. Status post 6 cycles.   INTERVAL HISTORY: Nancy Norris 74 y.o. female returns to the clinic today for follow-up visit. The patient was started on treatment again with Carfilzomib, Cytoxan and Decadron and tolerated the last cycle well except for the persistent fatigue and generalized weakness. She sleeps a lot. She has end-stage renal disease but she is not interested in hemodialysis. She denied having any significant chest pain, shortness of breath, cough or hemoptysis. She denied having any bleeding issues. The patient  denied having any significant nausea or vomiting. She has no fever or chills. She has no significant weight loss or night sweats. She has repeat CBC, comprehensive metabolic panel and myeloma panel performed earlier today and she is here today to start cycle #7 of her treatment.  MEDICAL HISTORY: Past Medical History  Diagnosis Date  . Hyperkalemia   . Hypothyroidism   . COPD (chronic obstructive pulmonary disease) ([place])   . Hyponatremia   . Dizziness   . Fibromyalgia   . Breast cancer ([place])   . Multiple myeloma   . Mucositis   . Hx of echocardiogram     a.  Echocardiogram (12/26/2012): EF 63-78%, grade 1 diastolic dysfunction;   b.  Echocardiogram (02/2013): EF 55-60%, no WMA, trivial effusion  . Fibromyalgia   . Arthritis   . Hx of cardiovascular stress test     LexiScan with low level exercise Myoview (02/2013): No ischemia, EF 72%; normal study  . Complication of anesthesia   . PONV (postoperative nausea and vomiting) 2008    after mastestomy  . Myocardial infarction [facility])     in past, patient was unaware.   . Anginal pain ([place])     used NTG x 2 May 31 and 06/15/13   . GERD (gastroesophageal reflux disease)   . Headache(784.0)   . Anxiety   . Depression   . CKD (chronic kidney disease) stage 3, GFR 30-59 ml/min   . Anemia   . History of blood transfusion     last one May 12   . Neuropathy ([place])   . B12 deficiency 12/04/2014    ALLERGIES:  is allergic to codeine; latex; other;  onion; zyprexa; adhesive; iodinated diagnostic agents; and sulfa antibiotics.  MEDICATIONS:  Current Outpatient Prescriptions  Medication Sig Dispense Refill  . albuterol (PROVENTIL HFA;VENTOLIN HFA) 108 (90 BASE) MCG/ACT inhaler Inhale 2 puffs into the lungs 2 (two) times daily as needed for wheezing or shortness of breath. 1 each 6  . ascorbic acid (VITAMIN C) 250 MG CHEW Chew 250 mg by mouth daily.    Marland Kitchen buPROPion (WELLBUTRIN XL) 300 MG 24 hr tablet Take 300 mg by mouth daily.   98  .  carboxymethylcellulose (REFRESH PLUS) 0.5 % SOLN Place 1 drop into both eyes every 2 (two) hours.    . Cholecalciferol 4000 UNITS TABS Take 4,000 Units by mouth daily.    . citalopram (CELEXA) 40 MG tablet Take 0.5 tablets (20 mg total) by mouth daily. 30 tablet 0  . cromolyn (OPTICROM) 4 % ophthalmic solution Place 1 drop into both eyes 4 (four) times daily.    Marland Kitchen dexamethasone (DECADRON) 4 MG tablet Take 10 tablets by mouth once a week as directed (Patient taking differently: Take 40 mg by mouth once a week. Only takes on Mondays when she has chemo) 40 tablet 3  . esomeprazole (NEXIUM) 40 MG capsule TAKE 1 CAPSULE BY MOUTH DAILY, EACH MORNING FOR ACID INDIGESTION 30 capsule PRN  . Ferrous Sulfate 134 MG TABS Take 1 tablet (134 mg total) by mouth every other day. 60 tablet 2  . gabapentin (NEURONTIN) 300 MG capsule TAKE 1 CAPSULE BY MOUTH AT BEDTIME. 90 capsule 3  . isosorbide mononitrate (IMDUR) 30 MG 24 hr tablet TAKE 1 TABLET BY MOUTH DAILY. 30 tablet 11  . levothyroxine (SYNTHROID, LEVOTHROID) 150 MCG tablet TAKE 1 TABLET BY MOUTH ONCE DAILY. 90 tablet 1  . loratadine (CLARITIN) 10 MG tablet Take 10 mg by mouth daily.    Marland Kitchen LORazepam (ATIVAN) 0.5 MG tablet Take 1 tablet (0.5 mg total) by mouth every 8 (eight) hours as needed (nausea). 30 tablet 0  . loteprednol (ALREX) 0.2 % SUSP Place 1 drop into both eyes 4 (four) times daily. 1 Bottle PRN  . midodrine (PROAMATINE) 10 MG tablet Take 1 tablet (10 mg total) by mouth 3 (three) times daily. 90 tablet 11  . ondansetron (ZOFRAN-ODT) 8 MG disintegrating tablet Take 1 tablet (8 mg total) by mouth every 8 (eight) hours as needed for nausea or vomiting. 20 tablet 3  . pilocarpine (SALAGEN) 5 MG tablet TAKE 1 TABLET BY MOUTH 3 TIMES DAILY. 90 tablet 0  . prochlorperazine (COMPAZINE) 10 MG tablet TAKE 1 TABLET BY MOUTH EVERY 6 HOURS AS NEEDED FOR NAUSEA OR VOMITING. 60 tablet 0  . ranitidine (ZANTAC) 300 MG tablet TAKE 1 TABLET BY MOUTH AT BEDTIME, FOR  ACID REFLUX AND INDIGESTION. 30 tablet PRN  . RESTASIS 0.05 % ophthalmic emulsion Place 1 drop into both eyes 2 (two) times daily.   4  . sodium bicarbonate 650 MG tablet Take 1 tablet (650 mg total) by mouth 2 (two) times daily. 60 tablet 0  . acetaminophen (TYLENOL) 325 MG tablet Take 650 mg by mouth every 6 (six) hours as needed for mild pain or moderate pain.     . hydrOXYzine (ATARAX/VISTARIL) 25 MG tablet Take 1 tablet by mouth 3 (three) times daily as needed for vomiting. anxiety  1  . loperamide (IMODIUM) 2 MG capsule Take two tabs po initially, then one tab after each loose stool: max 8 tabs in 24 hours (Patient not taking: Reported on 10/21/2014) 30 capsule 0  .  meclizine (ANTIVERT) 32 MG tablet Take 1 tablet (32 mg total) by mouth 3 (three) times daily as needed. (Patient not taking: Reported on 12/27/2014) 30 tablet 0  . nitroGLYCERIN (NITROSTAT) 0.4 MG SL tablet Place 1 tablet (0.4 mg total) under the tongue every 5 (five) minutes as needed for chest pain. (Patient not taking: Reported on 12/27/2014) 25 tablet 3  . senna (SENOKOT) 8.6 MG tablet Take 1 tablet by mouth at bedtime.     No current facility-administered medications for this visit.   Facility-Administered Medications Ordered in Other Visits  Medication Dose Route Frequency Provider Last Rate Last Dose  . 0.9 %  sodium chloride infusion   Intravenous Once Adrena E Johnson, PA-C      . 0.9 %  sodium chloride infusion  250 mL Intravenous Once Curt Bears, MD      . 0.9 %  sodium chloride infusion   Intravenous Once Curt Bears, MD      . acetaminophen (TYLENOL) tablet 650 mg  650 mg Oral Once Curt Bears, MD      . carfilzomib (KYPROLIS) 70 mg in dextrose 5 % 100 mL chemo infusion  37 mg/m2 (Treatment Plan Actual) Intravenous Once Curt Bears, MD      . cyclophosphamide (CYTOXAN) 560 mg in sodium chloride 0.9 % 250 mL chemo infusion  300 mg/m2 (Treatment Plan Actual) Intravenous Once Curt Bears, MD 556  mL/hr at 12/27/14 1336 560 mg at 12/27/14 1336  . diphenhydrAMINE (BENADRYL) capsule 25 mg  25 mg Oral Once Curt Bears, MD      . heparin lock flush 100 unit/mL  500 Units Intracatheter Daily PRN Curt Bears, MD      . heparin lock flush 100 unit/mL  500 Units Intracatheter Once PRN Curt Bears, MD      . sodium chloride 0.9 % injection 10 mL  10 mL Intracatheter PRN Curt Bears, MD   10 mL at 01/05/13 1825  . sodium chloride 0.9 % injection 10 mL  10 mL Intracatheter PRN Curt Bears, MD   10 mL at 06/15/14 1514  . sodium chloride 0.9 % injection 10 mL  10 mL Intracatheter PRN Curt Bears, MD      . sodium chloride 0.9 % injection 10 mL  10 mL Intracatheter PRN Curt Bears, MD        SURGICAL HISTORY:  Past Surgical History  Procedure Laterality Date  . History of port removal    . Status post stem cell transplant on September 28, 2008.    Marland Kitchen Abdominal hysterectomy  1981  . Cholecystectomy  1971  . Mastectomy Left 2008  . Cataract extraction, bilateral    . Breast surgery    . Eye surgery Bilateral     lens implant  . Breast reconstruction    . Portacath placement  12/2012    has had 2  . Av fistula placement Left 06/19/2013    Procedure: CREATION OF LEFT ARM ARTERIOVENOUS (AV) FISTULA ;  Surgeon: Angelia Mould, MD;  Location: [facility] OR;  Service: Vascular;  Laterality: Left;    REVIEW OF SYSTEMS:  Constitutional: positive for fatigue Eyes: negative Ears, nose, mouth, throat, and face: negative Respiratory: negative Cardiovascular: negative Gastrointestinal: negative Genitourinary:negative Integument/breast: negative Hematologic/lymphatic: negative Musculoskeletal:positive for muscle weakness Neurological: negative Behavioral/Psych: negative Endocrine: negative Allergic/Immunologic: negative   PHYSICAL EXAMINATION: General appearance: alert, cooperative and no distress Head: Normocephalic, without obvious abnormality, atraumatic Neck: no  adenopathy, no JVD, supple, symmetrical, trachea midline and thyroid not enlarged,  symmetric, no tenderness/mass/nodules Lymph nodes: Cervical, supraclavicular, and axillary nodes normal. Resp: clear to auscultation bilaterally Back: symmetric, no curvature. ROM normal. No CVA tenderness. Cardio: regular rate and rhythm, S1, S2 normal, no murmur, click, rub or gallop GI: soft, non-tender; bowel sounds normal; no masses,  no organomegaly Extremities: extremities normal, atraumatic, no cyanosis or edema Neurologic: Alert and oriented X 3, normal strength and tone. Normal symmetric reflexes. Normal coordination and gait  ECOG PERFORMANCE STATUS: 1 - Symptomatic but completely ambulatory  Blood pressure 120/44, pulse 80, temperature 98.3 F (36.8 C), temperature source Oral, resp. rate 18, height 5' 5.5" (1.664 m), weight 159 lb 12.8 oz (72.485 kg), SpO2 98 %.  LABORATORY DATA: Lab Results  Component Value Date   WBC 4.0 12/27/2014   HGB 7.6* 12/27/2014   HCT 23.9* 12/27/2014   MCV 106.2* 12/27/2014   PLT 115* 12/27/2014      Chemistry      Component Value Date/Time   NA 141 12/27/2014 1056   NA 141 12/07/2014 0515   K 3.8 12/27/2014 1056   K 3.8 12/07/2014 0515   CL 115* 12/07/2014 0515   CL 105 03/19/2012 0811   CO2 21* 12/27/2014 1056   CO2 19* 12/07/2014 0515   BUN 35.5* 12/27/2014 1056   BUN 24* 12/07/2014 0515   CREATININE 3.9* 12/27/2014 1056   CREATININE 2.74* 12/07/2014 0515   CREATININE 3.20* 08/13/2014 0959      Component Value Date/Time   CALCIUM 9.0 12/27/2014 1056   CALCIUM 8.4* 12/07/2014 0515   ALKPHOS 100 12/27/2014 1056   ALKPHOS 70 12/03/2014 0300   AST 17 12/27/2014 1056   AST 13* 12/03/2014 0300   ALT 20 12/27/2014 1056   ALT 27 12/03/2014 0300   BILITOT 0.96 12/27/2014 1056   BILITOT 1.3* 12/03/2014 0300     Myeloma panel: Beta-2 microglobulin 13.40, free kappa light chain 4.59, free lambda light chain 0.98,/lambda ratio 4.68. IgG 315, IgA 10  and IgM 20.  RADIOGRAPHIC STUDIES: Dg Chest 2 View  12/02/2014  CLINICAL DATA:  Acute onset of shortness of breath and dizziness. Initial encounter. EXAM: CHEST  2 VIEW COMPARISON:  Chest radiograph performed 07/13/2014 FINDINGS: The lungs are well-aerated. Pulmonary vascularity is at the upper limits of normal. There is no evidence of focal opacification, pleural effusion or pneumothorax. The heart is borderline normal in size. A right-sided chest port is noted ending about the distal SVC. No acute osseous abnormalities are seen. A clip is noted overlying the left breast. IMPRESSION: No acute cardiopulmonary process seen. Electronically Signed   By: Garald Balding M.D.   On: 12/02/2014 23:16   Dg Abd 1 View  12/06/2014  CLINICAL DATA:  Nausea and vomiting EXAM: ABDOMEN - 1 VIEW COMPARISON:  None. FINDINGS: Scattered large and small bowel gas is noted. Fecal material is noted throughout the colon without obstructive change or dilatation. Small right-sided pleural effusion is seen. No acute bony abnormality is noted. IMPRESSION: No acute abnormality seen in the abdomen. A small right-sided pleural effusion is noted. Electronically Signed   By: Inez Catalina M.D.   On: 12/06/2014 15:04   Nm Pulmonary Perf And Vent  12/03/2014  CLINICAL DATA:  Shortness of breath for 1 week. History of COPD of multiple myeloma. Initial encounter. EXAM: NUCLEAR MEDICINE VENTILATION - PERFUSION LUNG SCAN TECHNIQUE: Ventilation images were obtained in multiple projections using inhaled aerosol Tc-73m DTPA. Perfusion images were obtained in multiple projections after intravenous injection of Tc-10m MAA. RADIOPHARMACEUTICALS:  32.9  millicuries [id number] DTPA aerosol inhalation and 4.2 millicuries [id number] MAA IV COMPARISON:  None. FINDINGS: Ventilation: No focal ventilation defect. Perfusion: No wedge shaped peripheral perfusion defects to suggest acute pulmonary embolism. IMPRESSION: Negative exam. Electronically  Signed   By: Inge Rise M.D.   On: 12/03/2014 11:10    ASSESSMENT AND PLAN: This is a very pleasant 74 years old white female with history of multiple myeloma as well as chronic kidney disease status post several chemotherapy regimen and most recently treated with Carfilzomib, Cytoxan and Decadron status post 5 cycles. The patient has been tolerating her treatment well except for the fatigue secondary to end-stage renal disease as well as anemia of chronic disease. Her myeloma panel is still pending I recommended for her to proceed with cycle # 7 today as scheduled. I will see the patient back for follow-up visit in one month for reevaluation before starting the next cycle of her treatment. For the anemia of chronic disease, I will arrange for the patient to receive 2 units of PRBCs transfusion today. I will see her back for follow-up visit in 4 weeks for reevaluation and management of any adverse effect of her treatment. For the chronic kidney disease, the patient will continue her routine follow-up visit with her nephrologist. The patient was advised to call immediately if she has any concerning symptoms in the interval. The patient voices understanding of current disease status and treatment options and is in agreement with the current care plan.  All questions were answered. The patient knows to call the clinic with any problems, questions or concerns. We can certainly see the patient much sooner if necessary.  Disclaimer: This note was dictated with voice recognition software. Similar sounding words can inadvertently be transcribed and may not be corrected upon review.

## 2014-12-27 NOTE — Patient Instructions (Signed)
[facility] Discharge Instructions for Patients Receiving Chemotherapy  Today you received the following chemotherapy agents; Cytoxan and Kyprolis.   To help prevent nausea and vomiting after your treatment, we encourage you to take your nausea medication as directed.    If you develop nausea and vomiting that is not controlled by your nausea medication, call the clinic.   BELOW ARE SYMPTOMS THAT SHOULD BE REPORTED IMMEDIATELY:  *FEVER GREATER THAN 100.5 F  *CHILLS WITH OR WITHOUT FEVER  NAUSEA AND VOMITING THAT IS NOT CONTROLLED WITH YOUR NAUSEA MEDICATION  *UNUSUAL SHORTNESS OF BREATH  *UNUSUAL BRUISING OR BLEEDING  TENDERNESS IN MOUTH AND THROAT WITH OR WITHOUT PRESENCE OF ULCERS  *URINARY PROBLEMS  *BOWEL PROBLEMS  UNUSUAL RASH Items with * indicate a potential emergency and should be followed up as soon as possible.  Feel free to call the clinic you have any questions or concerns. The clinic phone number is [phone].  Please show the [facility] at check-in to the Emergency Department and triage nurse.    Blood Transfusion  A blood transfusion is a procedure in which you receive donated blood through an IV tube. You may need a blood transfusion because of illness, surgery, or injury. The blood may come from a donor, or it may be your own blood that you donated previously. The blood given in a transfusion is made up of different types of cells. You may receive:  Red blood cells. These carry oxygen and replace lost blood.  Platelets. These control bleeding.  Plasma. Thishelps blood to clot. If you have hemophilia or another clotting disorder, you may also receive other types of blood products. LET [facility] CARE PROVIDER KNOW ABOUT:  Any allergies you have.  All medicines you are taking, including vitamins, herbs, eye drops, creams, and over-the-counter medicines.  Previous problems you or members of your family have had with the use  of anesthetics.  Any blood disorders you have.  Previous surgeries you have had.  Any medical conditions you may have.  Any previous reactions you have had during a blood transfusion.  RISKS AND COMPLICATIONS Generally, this is a safe procedure. However, problems may occur, including:  Having an allergic reaction to something in the donated blood.  Fever. This may be a reaction to the white blood cells in the transfused blood.  Iron overload. This can happen from having many transfusions.  Transfusion-related acute lung injury (TRALI). This is a rare reaction that causes lung damage. The cause is not known.TRALI can occur within hours of a transfusion or several days later.  Sudden (acute) or delayed hemolytic reactions. This happens if your blood does not match the cells in your transfusion. Your body's defense system (immune system) may try to attack the new cells. This complication is rare.  Infection. This is rare. BEFORE THE PROCEDURE  You may have a blood test to determine your blood type. This is necessary to know what kind of blood your body will accept.  If you are going to have a planned surgery, you may donate your own blood. This may be done in case you need to have a transfusion.  If you have had an allergic reaction to a transfusion in the past, you may be given medicine to help prevent a reaction. Take this medicine only as directed by your health care provider.  You will have your temperature, blood pressure, and pulse monitored before the transfusion. PROCEDURE   An IV will be started in  your hand or arm.  The bag of donated blood will be attached to your IV tube and given into your vein.  Your temperature, blood pressure, and pulse will be monitored regularly during the transfusion. This monitoring is done to detect early signs of a transfusion reaction.  If you have any signs or symptoms of a reaction, your transfusion will be stopped and you may be given  medicine.  When the transfusion is over, your IV will be removed.  Pressure may be applied to the IV site for a few minutes.  A bandage (dressing) will be applied. The procedure may vary among health care providers and hospitals. AFTER THE PROCEDURE  Your blood pressure, temperature, and pulse will be monitored regularly.   This information is not intended to replace advice given to you by your health care provider. Make sure you discuss any questions you have with your health care provider.   Document Released: 12/30/1999 Document Revised: 01/22/2014 Document Reviewed: 11/11/2013 Elsevier Interactive Patient Education [employer].

## 2014-12-27 NOTE — Progress Notes (Signed)
Pt Hg 7.6. Pt does c/o sob, severe fatigue and weakness, and unsteady gail. Per Dr. Julien Nordmann, pt to receive 2units prbc today. Continue treatment today as well.  Pt received 1 unit of blood today. Will come back for Kyprolis treatment tomorrow and  1unit of blood.

## 2014-12-28 ENCOUNTER — Ambulatory Visit (HOSPITAL_BASED_OUTPATIENT_CLINIC_OR_DEPARTMENT_OTHER): Payer: Medicare Other

## 2014-12-28 ENCOUNTER — Other Ambulatory Visit: Payer: Self-pay | Admitting: Nurse Practitioner

## 2014-12-28 ENCOUNTER — Other Ambulatory Visit: Payer: Self-pay | Admitting: Medical Oncology

## 2014-12-28 ENCOUNTER — Ambulatory Visit: Payer: Medicare Other

## 2014-12-28 ENCOUNTER — Ambulatory Visit (HOSPITAL_BASED_OUTPATIENT_CLINIC_OR_DEPARTMENT_OTHER): Payer: Medicare Other | Admitting: Nurse Practitioner

## 2014-12-28 ENCOUNTER — Telehealth: Payer: Self-pay | Admitting: Internal Medicine

## 2014-12-28 VITALS — BP 104/48 | HR 75 | Temp 98.4°F | Resp 16

## 2014-12-28 DIAGNOSIS — N289 Disorder of kidney and ureter, unspecified: Secondary | ICD-10-CM

## 2014-12-28 DIAGNOSIS — C9000 Multiple myeloma not having achieved remission: Secondary | ICD-10-CM | POA: Diagnosis not present

## 2014-12-28 DIAGNOSIS — D649 Anemia, unspecified: Secondary | ICD-10-CM

## 2014-12-28 DIAGNOSIS — J0110 Acute frontal sinusitis, unspecified: Secondary | ICD-10-CM

## 2014-12-28 DIAGNOSIS — J320 Chronic maxillary sinusitis: Secondary | ICD-10-CM

## 2014-12-28 DIAGNOSIS — Z5112 Encounter for antineoplastic immunotherapy: Secondary | ICD-10-CM

## 2014-12-28 MED ORDER — ACETAMINOPHEN 325 MG PO TABS
650.0000 mg | ORAL_TABLET | Freq: Once | ORAL | Status: AC
  Administered 2014-12-28: 650 mg via ORAL

## 2014-12-28 MED ORDER — SODIUM CHLORIDE 0.9 % IV SOLN
Freq: Once | INTRAVENOUS | Status: AC
  Administered 2014-12-28: 13:00:00 via INTRAVENOUS

## 2014-12-28 MED ORDER — DIPHENHYDRAMINE HCL 25 MG PO CAPS
25.0000 mg | ORAL_CAPSULE | Freq: Once | ORAL | Status: AC
  Administered 2014-12-28: 25 mg via ORAL

## 2014-12-28 MED ORDER — SODIUM CHLORIDE 0.9 % IV SOLN
Freq: Once | INTRAVENOUS | Status: AC
  Administered 2014-12-28: 15:00:00 via INTRAVENOUS

## 2014-12-28 MED ORDER — AMOXICILLIN-POT CLAVULANATE 875-125 MG PO TABS: 1.0000 | ORAL_TABLET | Freq: Two times a day (BID) | ORAL | Status: DC

## 2014-12-28 MED ORDER — SODIUM CHLORIDE 0.9 % IV SOLN: 250.0000 mL | Freq: Once | INTRAVENOUS | Status: DC

## 2014-12-28 MED ORDER — SODIUM CHLORIDE 0.9 % IJ SOLN
10.0000 mL | INTRAMUSCULAR | Status: DC | PRN
  Administered 2014-12-28: 10 mL
  Filled 2014-12-28: qty 10

## 2014-12-28 MED ORDER — ACETAMINOPHEN 325 MG PO TABS
ORAL_TABLET | ORAL | Status: AC
  Filled 2014-12-28: qty 2

## 2014-12-28 MED ORDER — DIPHENHYDRAMINE HCL 25 MG PO CAPS
ORAL_CAPSULE | ORAL | Status: AC
  Filled 2014-12-28: qty 1

## 2014-12-28 MED ORDER — SODIUM CHLORIDE 0.9 % IV SOLN
Freq: Once | INTRAVENOUS | Status: AC
  Administered 2014-12-28: 14:00:00 via INTRAVENOUS
  Filled 2014-12-28: qty 4

## 2014-12-28 MED ORDER — CARFILZOMIB CHEMO INJECTION 60 MG
37.0000 mg/m2 | Freq: Once | INTRAVENOUS | Status: AC
  Administered 2014-12-28: 70 mg via INTRAVENOUS
  Filled 2014-12-28: qty 35

## 2014-12-28 MED ORDER — HEPARIN SOD (PORK) LOCK FLUSH 100 UNIT/ML IV SOLN
500.0000 [IU] | Freq: Once | INTRAVENOUS | Status: AC | PRN
  Administered 2014-12-28: 500 [IU]
  Filled 2014-12-28: qty 5

## 2014-12-28 NOTE — Progress Notes (Signed)
C/o sinus pressure and headache this afternoon., along with neck pain.  Pt states she feels like she has a sinus infection.   Feels worn out. VSS  Afebrile. Notified [name], NP for [facility] to see pt in chemo room.

## 2014-12-28 NOTE — Telephone Encounter (Signed)
lvm for pt to come to sched to r/s appt.Marland KitchenMarland KitchenMarland Kitchen

## 2014-12-28 NOTE — Patient Instructions (Signed)
[facility] Discharge Instructions for Patients Receiving Chemotherapy  Today you received the following chemotherapy agents: Kyprolis.  To help prevent nausea and vomiting after your treatment, we encourage you to take your nausea medication: Compazine 10 mg every 6 hours as needed; Zofran 8 mg every 8 hours as needed.   If you develop nausea and vomiting that is not controlled by your nausea medication, call the clinic.   BELOW ARE SYMPTOMS THAT SHOULD BE REPORTED IMMEDIATELY:  *FEVER GREATER THAN 100.5 F  *CHILLS WITH OR WITHOUT FEVER  NAUSEA AND VOMITING THAT IS NOT CONTROLLED WITH YOUR NAUSEA MEDICATION  *UNUSUAL SHORTNESS OF BREATH  *UNUSUAL BRUISING OR BLEEDING  TENDERNESS IN MOUTH AND THROAT WITH OR WITHOUT PRESENCE OF ULCERS  *URINARY PROBLEMS  *BOWEL PROBLEMS  UNUSUAL RASH Items with * indicate a potential emergency and should be followed up as soon as possible.  Feel free to call the clinic you have any questions or concerns. The clinic phone number is [phone].  Please show the [facility] at check-in to the Emergency Department and triage nurse.    Blood Transfusion  A blood transfusion is a procedure in which you receive donated blood through an IV tube. You may need a blood transfusion because of illness, surgery, or injury. The blood may come from a donor, or it may be your own blood that you donated previously. The blood given in a transfusion is made up of different types of cells. You may receive:  Red blood cells. These carry oxygen and replace lost blood.  Platelets. These control bleeding.  Plasma. Thishelps blood to clot. If you have hemophilia or another clotting disorder, you may also receive other types of blood products. LET [facility] CARE PROVIDER KNOW ABOUT:  Any allergies you have.  All medicines you are taking, including vitamins, herbs, eye drops, creams, and over-the-counter medicines.  Previous problems  you or members of your family have had with the use of anesthetics.  Any blood disorders you have.  Previous surgeries you have had.  Any medical conditions you may have.  Any previous reactions you have had during a blood transfusion.  RISKS AND COMPLICATIONS Generally, this is a safe procedure. However, problems may occur, including:  Having an allergic reaction to something in the donated blood.  Fever. This may be a reaction to the white blood cells in the transfused blood.  Iron overload. This can happen from having many transfusions.  Transfusion-related acute lung injury (TRALI). This is a rare reaction that causes lung damage. The cause is not known.TRALI can occur within hours of a transfusion or several days later.  Sudden (acute) or delayed hemolytic reactions. This happens if your blood does not match the cells in your transfusion. Your body's defense system (immune system) may try to attack the new cells. This complication is rare.  Infection. This is rare. BEFORE THE PROCEDURE  You may have a blood test to determine your blood type. This is necessary to know what kind of blood your body will accept.  If you are going to have a planned surgery, you may donate your own blood. This may be done in case you need to have a transfusion.  If you have had an allergic reaction to a transfusion in the past, you may be given medicine to help prevent a reaction. Take this medicine only as directed by your health care provider.  You will have your temperature, blood pressure, and pulse monitored before the  transfusion. PROCEDURE   An IV will be started in your hand or arm.  The bag of donated blood will be attached to your IV tube and given into your vein.  Your temperature, blood pressure, and pulse will be monitored regularly during the transfusion. This monitoring is done to detect early signs of a transfusion reaction.  If you have any signs or symptoms of a reaction,  your transfusion will be stopped and you may be given medicine.  When the transfusion is over, your IV will be removed.  Pressure may be applied to the IV site for a few minutes.  A bandage (dressing) will be applied. The procedure may vary among health care providers and hospitals. AFTER THE PROCEDURE  Your blood pressure, temperature, and pulse will be monitored regularly.   This information is not intended to replace advice given to you by your health care provider. Make sure you discuss any questions you have with your health care provider.   Document Released: 12/30/1999 Document Revised: 01/22/2014 Document Reviewed: 11/11/2013 Elsevier Interactive Patient Education [employer].

## 2014-12-29 ENCOUNTER — Ambulatory Visit: Payer: Self-pay | Admitting: Internal Medicine

## 2014-12-29 ENCOUNTER — Encounter: Payer: Self-pay | Admitting: Nurse Practitioner

## 2014-12-29 LAB — TYPE AND SCREEN
ABO/RH(D): O POS
Antibody Screen: NEGATIVE
UNIT DIVISION: 0
UNIT DIVISION: 0
Unit division: 0

## 2014-12-29 LAB — KAPPA/LAMBDA LIGHT CHAINS
KAPPA FREE LGHT CHN: 1.56 mg/dL (ref 0.33–1.94)
Kappa:Lambda Ratio: 1.1 (ref 0.26–1.65)
LAMBDA FREE LGHT CHN: 1.42 mg/dL (ref 0.57–2.63)

## 2014-12-29 LAB — BETA 2 MICROGLOBULIN, SERUM: Beta-2 Microglobulin: 15.7 mg/L — ABNORMAL HIGH (ref <=2.51)

## 2014-12-29 LAB — IGG, IGA, IGM
IgA: <6 mg/dL — ABNORMAL LOW (ref 69–380)
IgG (Immunoglobin G), Serum: 214 mg/dL — ABNORMAL LOW (ref 690–1700)

## 2014-12-29 NOTE — Assessment & Plan Note (Signed)
Patient has history of chronic renal insufficiency.  Creatinine today has increased to 3.9.  Will continue to monitor closely.

## 2014-12-29 NOTE — Assessment & Plan Note (Signed)
Hemoglobin down to 7.6 with labs drawn on 12/27/2014.  Patient received 1 unit blood transfusion yesterday; and will receive an additional unit of blood transfusion today.  We'll continue to monitor closely.

## 2014-12-29 NOTE — Assessment & Plan Note (Signed)
Patient received cycle 7, day 1 of her Cytoxan/Kyprolis chemotherapy yesterday, 12/27/2014.  She returned to the [facility] today to receive cycle 7, day 2 of the Kyprolis only.   Patient is scheduled to return on 01/03/2015 for labs and chemotherapy.

## 2014-12-29 NOTE — Assessment & Plan Note (Addendum)
Patient complains of worsening URI symptoms; which include nasal congestion, frequent headaches, and increased tenderness to entire face-specifically the forehead.  She denies any sore throat.  She denies any recent fevers or chills.  On exam.  Patient does have some mild nasal congestion; and moderate tenderness with palpation to maxillary sinuses and  forehead.  Will prescribe patient Augmentin for symptoms of sinusitis.  Patient was advised to call/return , or go directly to the emergency department for any worsening symptoms whatsoever.

## 2014-12-29 NOTE — Progress Notes (Signed)
SYMPTOM MANAGEMENT CLINIC   HPI: Nancy Norris 74 y.o. female diagnosed with multiple myeloma.  Currently undergoing Cytoxan/Kyprolis chemotherapy regimen.  Patient presented to the [facility] today to receive cycle 7, day 2 of her chemotherapy.  She also received 1 unit of blood yesterday and will receive 1 unit of blood today for symptomatic anemia with hemoglobin 7.6.   Patient complains of worsening URI symptoms; which include nasal congestion, frequent headaches, and increased tenderness to entire face-specifically the forehead.  She denies any sore throat.  She denies any recent fevers or chills.  On exam.  Patient does have some mild nasal congestion; and moderate tenderness with palpation to maxillary sinuses and  forehead.  Will prescribe patient Augmentin for symptoms of sinusitis.  Patient was advised to call/return , or go directly to the emergency department for any worsening symptoms whatsoever.  HPI  ROS  Past Medical History  Diagnosis Date  . Hyperkalemia   . Hypothyroidism   . COPD (chronic obstructive pulmonary disease) ([place])   . Hyponatremia   . Dizziness   . Fibromyalgia   . Breast cancer ([place])   . Multiple myeloma   . Mucositis   . Hx of echocardiogram     a.  Echocardiogram (12/26/2012): EF 81-85%, grade 1 diastolic dysfunction;   b.  Echocardiogram (02/2013): EF 55-60%, no WMA, trivial effusion  . Fibromyalgia   . Arthritis   . Hx of cardiovascular stress test     LexiScan with low level exercise Myoview (02/2013): No ischemia, EF 72%; normal study  . Complication of anesthesia   . PONV (postoperative nausea and vomiting) 2008    after mastestomy  . Myocardial infarction [facility])     in past, patient was unaware.   . Anginal pain ([place])     used NTG x 2 May 31 and 06/15/13   . GERD (gastroesophageal reflux disease)   . Headache(784.0)   . Anxiety   . Depression   . CKD (chronic kidney disease) stage 3, GFR 30-59 ml/min   . Anemia   . History  of blood transfusion     last one May 12   . Neuropathy ([place])   . B12 deficiency 12/04/2014    Past Surgical History  Procedure Laterality Date  . History of port removal    . Status post stem cell transplant on September 28, 2008.    Marland Kitchen Abdominal hysterectomy  1981  . Cholecystectomy  1971  . Mastectomy Left 2008  . Cataract extraction, bilateral    . Breast surgery    . Eye surgery Bilateral     lens implant  . Breast reconstruction    . Portacath placement  12/2012    has had 2  . Av fistula placement Left 06/19/2013    Procedure: CREATION OF LEFT ARM ARTERIOVENOUS (AV) FISTULA ;  Surgeon: Angelia Mould, MD;  Location: [facility];  Service: Vascular;  Laterality: Left;    has Hypercalcemia; HX: breast cancer; Asthma, chronic; Hypothyroid; Chronic diastolic heart failure (HCC); HTN (hypertension); GERD (gastroesophageal reflux disease); COPD (chronic obstructive pulmonary disease) with emphysema ([place]); Fibromyalgia; Mixed hyperlipidemia; Prediabetes; Vitamin D deficiency; Medication management; Depression, controlled; Multiple myeloma ([place]); Fatigue; Orthostatic hypotension; CKD (chronic kidney disease) stage 5, GFR less than 15 ml/min (HCC); Symptomatic anemia; Nausea with vomiting; Renal insufficiency; Dehydration; Hypoalbuminemia; Encounter for antineoplastic chemotherapy; CHF (congestive heart failure) ([place]); Dizziness; Malnutrition of moderate degree; B12 deficiency; Thrombocytopenia ([place]); SOB (shortness of breath); Nausea & vomiting; and Sinusitis on  her problem list.    is allergic to codeine; latex; other; onion; zyprexa; adhesive; iodinated diagnostic agents; and sulfa antibiotics.    Medication List       This list is accurate as of: 12/28/14 11:59 PM.  Always use your most recent med list.               acetaminophen 325 MG tablet  Commonly known as:  TYLENOL  Take 650 mg by mouth every 6 (six) hours as needed for mild pain or moderate pain.     albuterol 108  (90 BASE) MCG/ACT inhaler  Commonly known as:  PROVENTIL HFA;VENTOLIN HFA  Inhale 2 puffs into the lungs 2 (two) times daily as needed for wheezing or shortness of breath.     amoxicillin-clavulanate 875-125 MG tablet  Commonly known as:  AUGMENTIN  Take 1 tablet by mouth 2 (two) times daily.     ascorbic acid 250 MG Chew  Commonly known as:  VITAMIN C  Chew 250 mg by mouth daily.     buPROPion 300 MG 24 hr tablet  Commonly known as:  WELLBUTRIN XL  Take 300 mg by mouth daily.     carboxymethylcellulose 0.5 % Soln  Commonly known as:  REFRESH PLUS  Place 1 drop into both eyes every 2 (two) hours.     Cholecalciferol 4000 UNITS Tabs  Take 4,000 Units by mouth daily.     citalopram 40 MG tablet  Commonly known as:  CELEXA  Take 0.5 tablets (20 mg total) by mouth daily.     cromolyn 4 % ophthalmic solution  Commonly known as:  OPTICROM  Place 1 drop into both eyes 4 (four) times daily.     dexamethasone 4 MG tablet  Commonly known as:  DECADRON  Take 10 tablets by mouth once a week as directed     esomeprazole 40 MG capsule  Commonly known as:  NEXIUM  TAKE 1 CAPSULE BY MOUTH DAILY, EACH MORNING FOR ACID INDIGESTION     Ferrous Sulfate 134 MG Tabs  Take 1 tablet (134 mg total) by mouth every other day.     gabapentin 300 MG capsule  Commonly known as:  NEURONTIN  TAKE 1 CAPSULE BY MOUTH AT BEDTIME.     hydrOXYzine 25 MG tablet  Commonly known as:  ATARAX/VISTARIL  Take 1 tablet by mouth 3 (three) times daily as needed for vomiting. anxiety     isosorbide mononitrate 30 MG 24 hr tablet  Commonly known as:  IMDUR  TAKE 1 TABLET BY MOUTH DAILY.     levothyroxine 150 MCG tablet  Commonly known as:  SYNTHROID, LEVOTHROID  TAKE 1 TABLET BY MOUTH ONCE DAILY.     loperamide 2 MG capsule  Commonly known as:  IMODIUM  Take two tabs po initially, then one tab after each loose stool: max 8 tabs in 24 hours     loratadine 10 MG tablet  Commonly known as:  CLARITIN    Take 10 mg by mouth daily.     LORazepam 0.5 MG tablet  Commonly known as:  ATIVAN  Take 1 tablet (0.5 mg total) by mouth every 8 (eight) hours as needed (nausea).     loteprednol 0.2 % Susp  Commonly known as:  ALREX  Place 1 drop into both eyes 4 (four) times daily.     meclizine 32 MG tablet  Commonly known as:  ANTIVERT  Take 1 tablet (32 mg total) by mouth 3 (three) times daily as  needed.     midodrine 10 MG tablet  Commonly known as:  PROAMATINE  Take 1 tablet (10 mg total) by mouth 3 (three) times daily.     nitroGLYCERIN 0.4 MG SL tablet  Commonly known as:  NITROSTAT  Place 1 tablet (0.4 mg total) under the tongue every 5 (five) minutes as needed for chest pain.     ondansetron 8 MG disintegrating tablet  Commonly known as:  ZOFRAN-ODT  Take 1 tablet (8 mg total) by mouth every 8 (eight) hours as needed for nausea or vomiting.     pilocarpine 5 MG tablet  Commonly known as:  SALAGEN  TAKE 1 TABLET BY MOUTH 3 TIMES DAILY.     prochlorperazine 10 MG tablet  Commonly known as:  COMPAZINE  TAKE 1 TABLET BY MOUTH EVERY 6 HOURS AS NEEDED FOR NAUSEA OR VOMITING.     ranitidine 300 MG tablet  Commonly known as:  ZANTAC  TAKE 1 TABLET BY MOUTH AT BEDTIME, FOR ACID REFLUX AND INDIGESTION.     RESTASIS 0.05 % ophthalmic emulsion  Generic drug:  cycloSPORINE  Place 1 drop into both eyes 2 (two) times daily.     senna 8.6 MG tablet  Commonly known as:  SENOKOT  Take 1 tablet by mouth at bedtime.     sodium bicarbonate 650 MG tablet  Take 1 tablet (650 mg total) by mouth 2 (two) times daily.         PHYSICAL EXAMINATION  Oncology Vitals 12/28/2014 12/28/2014  Height - -  Weight - -  Weight (lbs) - -  BMI (kg/m2) - -  Temp 98.4 98.4  Pulse 75 71  Resp 16 18  Resp (Historical as of 08/16/11) - -  SpO2 92 95  BSA (m2) - -   BP Readings from Last 2 Encounters:  12/28/14 104/48  12/27/14 123/52    Physical Exam  Constitutional: She is oriented to person,  place, and time and well-developed, well-nourished, and in no distress.  HENT:  Head: Normocephalic and atraumatic.  Mouth/Throat: Oropharynx is clear and moist.  Patient noted to have mild nasal congestion and tenderness to palpation to the maxillary and frontal sinuses.  Eyes: Conjunctivae and EOM are normal. Pupils are equal, round, and reactive to light. Right eye exhibits no discharge. Left eye exhibits no discharge. No scleral icterus.  Neck: Normal range of motion. Neck supple. No JVD present. No tracheal deviation present. No thyromegaly present.  Cardiovascular: Normal rate, regular rhythm, normal heart sounds and intact distal pulses.   Pulmonary/Chest: Effort normal and breath sounds normal. No respiratory distress. She has no wheezes. She has no rales. She exhibits no tenderness.  Abdominal: Soft. Bowel sounds are normal. She exhibits no distension and no mass. There is no tenderness. There is no rebound and no guarding.  Musculoskeletal: Normal range of motion. She exhibits no edema or tenderness.  Lymphadenopathy:    She has no cervical adenopathy.  Neurological: She is alert and oriented to person, place, and time.  Skin: Skin is warm and dry.  Psychiatric: Affect normal.  Nursing note and vitals reviewed.   LABORATORY DATA:. Infusion on 12/27/2014  Component Date Value Ref Range Status  . Order Confirmation 12/27/2014 ORDER PROCESSED BY BLOOD BANK   Final  Hospital Outpatient Visit on 12/27/2014  Component Date Value Ref Range Status  . ABO/RH(D) 12/27/2014 O POS   Final  . Antibody Screen 12/27/2014 NEG   Final  . Sample Expiration 12/27/2014 12/30/2014  Final  . Unit Number 12/27/2014 [id number]   Final  . Blood Component Type 12/27/2014 RBC, LR IRR   Final  . Unit division 12/27/2014 00   Final  . Status of Unit 12/27/2014 REL FROM [facility]   Final  . Transfusion Status 12/27/2014 OK TO TRANSFUSE   Final  . Crossmatch Result 12/27/2014 Compatible   Final  . Unit  Number 12/27/2014 [id number]   Final  . Blood Component Type 12/27/2014 RCLI PHER 1   Final  . Unit division 12/27/2014 00   Final  . Status of Unit 12/27/2014 ISSUED,FINAL   Final  . Transfusion Status 12/27/2014 OK TO TRANSFUSE   Final  . Crossmatch Result 12/27/2014 Compatible   Final  . Unit Number 12/27/2014 [id number]   Final  . Blood Component Type 12/27/2014 RCLI PHER 2   Final  . Unit division 12/27/2014 00   Final  . Status of Unit 12/27/2014 ISSUED,FINAL   Final  . Transfusion Status 12/27/2014 OK TO TRANSFUSE   Final  . Crossmatch Result 12/27/2014 Compatible   Final  Appointment on 12/27/2014  Component Date Value Ref Range Status  . WBC 12/27/2014 4.0  3.9 - 10.3 10e3/uL Final  . NEUT# 12/27/2014 2.0  1.5 - 6.5 10e3/uL Final  . HGB 12/27/2014 7.6* 11.6 - 15.9 g/dL Final  . HCT 12/27/2014 23.9* 34.8 - 46.6 % Final  . Platelets 12/27/2014 115* 145 - 400 10e3/uL Final  . MCV 12/27/2014 106.2* 79.5 - 101.0 fL Final  . MCH 12/27/2014 33.8  25.1 - 34.0 pg Final  . MCHC 12/27/2014 31.8  31.5 - 36.0 g/dL Final  . RBC 12/27/2014 2.25* 3.70 - 5.45 10e6/uL Final  . RDW 12/27/2014 20.0* 11.2 - 14.5 % Final  . lymph# 12/27/2014 1.1  0.9 - 3.3 10e3/uL Final  . MONO# 12/27/2014 0.6  0.1 - 0.9 10e3/uL Final  . Eosinophils Absolute 12/27/2014 0.3  0.0 - 0.5 10e3/uL Final  . Basophils Absolute 12/27/2014 0.0  0.0 - 0.1 10e3/uL Final  . NEUT% 12/27/2014 50.5  38.4 - 76.8 % Final  . LYMPH% 12/27/2014 27.6  14.0 - 49.7 % Final  . MONO% 12/27/2014 14.2* 0.0 - 14.0 % Final  . EOS% 12/27/2014 7.2* 0.0 - 7.0 % Final  . BASO% 12/27/2014 0.5  0.0 - 2.0 % Final  . Beta-2 Microglobulin 12/27/2014 15.70* <=2.51 mg/L Final  . Kappa free light chain 12/27/2014 1.56  0.33 - 1.94 mg/dL Final  . Lambda Free Lght Chn 12/27/2014 1.42  0.57 - 2.63 mg/dL Final  . Kappa:Lambda Ratio 12/27/2014 1.10  0.26 - 1.65 Final  . IgG (Immunoglobin G), Serum 12/27/2014 214* 690 - 1700 mg/dL Final  . IgA  12/27/2014 <6* 69 - 380 mg/dL Final  . IgM, Serum 12/27/2014 <5* 52 - 322 mg/dL Final  . LDH 12/27/2014 202  125 - 245 U/L Final  . Sodium 12/27/2014 141  136 - 145 mEq/L Final  . Potassium 12/27/2014 3.8  3.5 - 5.1 mEq/L Final  . Chloride 12/27/2014 109  98 - 109 mEq/L Final  . CO2 12/27/2014 21* 22 - 29 mEq/L Final  . Glucose 12/27/2014 93  70 - 140 mg/dl Final   Glucose reference range is for nonfasting patients. Fasting glucose reference range is 70- 100.  Marland Kitchen BUN 12/27/2014 35.5* 7.0 - 26.0 mg/dL Final  . Creatinine 12/27/2014 3.9* 0.6 - 1.1 mg/dL Final  . Total Bilirubin 12/27/2014 0.96  0.20 - 1.20 mg/dL Final  . Alkaline Phosphatase 12/27/2014 100  40 - 150 U/L Final  . AST 12/27/2014 17  5 - 34 U/L Final  . ALT 12/27/2014 20  0 - 55 U/L Final  . Total Protein 12/27/2014 5.9* 6.4 - 8.3 g/dL Final  . Albumin 12/27/2014 3.8  3.5 - 5.0 g/dL Final  . Calcium 12/27/2014 9.0  8.4 - 10.4 mg/dL Final  . Anion Gap 12/27/2014 11  3 - 11 mEq/L Final  . EGFR 12/27/2014 11* >90 ml/min/1.73 m2 Final   eGFR is calculated using the CKD-EPI Creatinine Equation (2009)     RADIOGRAPHIC STUDIES: No results found.  ASSESSMENT/PLAN:    Symptomatic anemia Hemoglobin down to 7.6 with labs drawn on 12/27/2014.  Patient received 1 unit blood transfusion yesterday; and will receive an additional unit of blood transfusion today.  We'll continue to monitor closely.  Sinusitis Patient complains of worsening URI symptoms; which include nasal congestion, frequent headaches, and increased tenderness to entire face-specifically the forehead.  She denies any sore throat.  She denies any recent fevers or chills.  On exam.  Patient does have some mild nasal congestion; and moderate tenderness with palpation to maxillary sinuses and  forehead.  Will prescribe patient Augmentin for symptoms of sinusitis.  Patient was advised to call/return , or go directly to the emergency department for any worsening symptoms  whatsoever.  Renal insufficiency Patient has history of chronic renal insufficiency.  Creatinine today has increased to 3.9.  Will continue to monitor closely.  Multiple myeloma ([place]) Patient received cycle 7, day 1 of her Cytoxan/Kyprolis chemotherapy yesterday, 12/27/2014.  She returned to the [facility] today to receive cycle 7, day 2 of the Kyprolis only.   Patient is scheduled to return on 01/03/2015 for labs and chemotherapy.  Patient stated understanding of all instructions; and was in agreement with this plan of care. The patient knows to call the clinic with any problems, questions or concerns.   Review/collaboration with Dr. Julien Nordmann regarding all aspects of patient's visit today.   Total time spent with patient was 15 minutes;  with greater than 75 percent of that time spent in face to face counseling regarding patient's symptoms,  and coordination of care and follow up.  Disclaimer:This dictation was prepared with Dragon/digital dictation along with [employer]. Any transcriptional errors that result from this process are unintentional.  Drue Second, NP 12/29/2014

## 2014-12-30 ENCOUNTER — Other Ambulatory Visit: Payer: Self-pay | Admitting: *Deleted

## 2014-12-30 DIAGNOSIS — C9000 Multiple myeloma not having achieved remission: Secondary | ICD-10-CM

## 2014-12-31 ENCOUNTER — Ambulatory Visit: Payer: Self-pay | Admitting: Internal Medicine

## 2014-12-31 ENCOUNTER — Telehealth: Payer: Self-pay | Admitting: Internal Medicine

## 2014-12-31 NOTE — Telephone Encounter (Signed)
Called spoke with pt. She is scheduled to see Dr. Annamaria Boots on Monday. Nothing further needed

## 2015-01-03 ENCOUNTER — Ambulatory Visit: Payer: Medicare Other

## 2015-01-03 ENCOUNTER — Other Ambulatory Visit (HOSPITAL_BASED_OUTPATIENT_CLINIC_OR_DEPARTMENT_OTHER): Payer: Medicare Other

## 2015-01-03 ENCOUNTER — Ambulatory Visit: Payer: Medicare Other | Admitting: Internal Medicine

## 2015-01-03 ENCOUNTER — Encounter: Payer: Self-pay | Admitting: Internal Medicine

## 2015-01-03 VITALS — BP 130/84 | HR 74 | Wt 160.8 lb

## 2015-01-03 VITALS — BP 129/57 | HR 66 | Temp 98.2°F | Resp 18

## 2015-01-03 DIAGNOSIS — C9000 Multiple myeloma not having achieved remission: Secondary | ICD-10-CM

## 2015-01-03 DIAGNOSIS — J439 Emphysema, unspecified: Secondary | ICD-10-CM

## 2015-01-03 LAB — CBC & DIFF AND RETIC
BASO%: 0.4 % (ref 0.0–2.0)
Basophils Absolute: 0 10*3/uL (ref 0.0–0.1)
EOS ABS: 0.5 10*3/uL (ref 0.0–0.5)
EOS%: 10.4 % — AB (ref 0.0–7.0)
HCT: 29.6 % — ABNORMAL LOW (ref 34.8–46.6)
HEMOGLOBIN: 9.6 g/dL — AB (ref 11.6–15.9)
IMMATURE RETIC FRACT: 15.4 % — AB (ref 1.60–10.00)
LYMPH%: 24.9 % (ref 14.0–49.7)
MCH: 32.7 pg (ref 25.1–34.0)
MCHC: 32.4 g/dL (ref 31.5–36.0)
MCV: 100.7 fL (ref 79.5–101.0)
MONO#: 0.8 10*3/uL (ref 0.1–0.9)
MONO%: 15.1 % — AB (ref 0.0–14.0)
NEUT%: 49.2 % (ref 38.4–76.8)
NEUTROS ABS: 2.5 10*3/uL (ref 1.5–6.5)
PLATELETS: 42 10*3/uL — AB (ref 145–400)
RBC: 2.94 10*6/uL — AB (ref 3.70–5.45)
RDW: 20 % — ABNORMAL HIGH (ref 11.2–14.5)
Retic %: 2.07 % (ref 0.70–2.10)
Retic Ct Abs: 60.86 10*3/uL (ref 33.70–90.70)
WBC: 5.1 10*3/uL (ref 3.9–10.3)
lymph#: 1.3 10*3/uL (ref 0.9–3.3)
nRBC: 0 % (ref 0–0)

## 2015-01-03 LAB — COMPREHENSIVE METABOLIC PANEL
ALBUMIN: 3.7 g/dL (ref 3.5–5.0)
ALT: 19 U/L (ref 0–55)
ANION GAP: 8 meq/L (ref 3–11)
AST: 20 U/L (ref 5–34)
Alkaline Phosphatase: 119 U/L (ref 40–150)
BILIRUBIN TOTAL: 0.66 mg/dL (ref 0.20–1.20)
BUN: 27.8 mg/dL — AB (ref 7.0–26.0)
CALCIUM: 10.1 mg/dL (ref 8.4–10.4)
CHLORIDE: 106 meq/L (ref 98–109)
CO2: 28 mEq/L (ref 22–29)
CREATININE: 3.4 mg/dL — AB (ref 0.6–1.1)
EGFR: 13 mL/min/{1.73_m2} — ABNORMAL LOW (ref >90)
Glucose: 96 mg/dl (ref 70–140)
Potassium: 4.6 mEq/L (ref 3.5–5.1)
Sodium: 142 mEq/L (ref 136–145)
TOTAL PROTEIN: 5.8 g/dL — AB (ref 6.4–8.3)

## 2015-01-03 MED ORDER — BENZONATATE 200 MG PO CAPS: 200.0000 mg | ORAL_CAPSULE | Freq: Three times a day (TID) | ORAL | Status: DC | PRN

## 2015-01-03 MED ORDER — FLUCONAZOLE 150 MG PO TABS: ORAL_TABLET | ORAL | Status: DC

## 2015-01-03 NOTE — Progress Notes (Signed)
CBC reviewed with Dr. Julien Nordmann. HOLD chemo this week due to PLT 42. Thrombocytopenic precautions discussed, pt voiced understanding. Pt states she wants a "break from treatment." Would like to discuss with MD. Message to Dr. Julien Nordmann with this request.  Pt also reports yeast infection symptoms since beginning Augmentin. Discussed with Drue Second, NP: Order received for Diflucan. Provider made aware of interaction/ possible QT prolongation: OK to take single dose of Fluconazole, pt to space doses out from each other, per Caren Griffins, NP. Pt stated she will take Celexa in AM, Diflucan in PM.--TCW

## 2015-01-03 NOTE — Patient Instructions (Signed)
Sample Anoro Ellipta inhaler    Inhale 1 puff, once daily, every day  You can still use your rescue inhaler as before  Script sent for benzonatate perles for cough  Please call if we can help

## 2015-01-03 NOTE — Patient Instructions (Signed)
Thrombocytopenia °Thrombocytopenia is a condition in which there is an abnormally small number of platelets in your blood. Platelets are also called thrombocytes. Platelets are needed for blood clotting. °CAUSES °Thrombocytopenia is caused by:  °· Decreased production of platelets. This can be caused by: °¨ Aplastic anemia in which your bone marrow quits making blood cells. °¨ Cancer in the bone marrow. °¨ Use of certain medicines, including chemotherapy. °¨ Infection in the bone marrow. °¨ Heavy alcohol consumption. °· Increased destruction of platelets. This can be caused by: °¨ Certain immune diseases. °¨ Use of certain drugs. °¨ Certain blood clotting disorders. °¨ Certain inherited disorders. °¨ Certain bleeding disorders. °¨ Pregnancy. °· Having an enlarged spleen (hypersplenism). In hypersplenism, the spleen gathers up platelets from circulation. This means the platelets are not available to help with blood clotting. The spleen can enlarge due to cirrhosis or other conditions. °SYMPTOMS  °The symptoms of thrombocytopenia are side effects of poor blood clotting. Some of these are: °· Abnormal bleeding. °· Nosebleeds. °· Heavy menstrual periods. °· Blood in the urine or stools. °· Purpura. This is a purplish discoloration in the skin produced by small bleeding vessels near the surface of the skin. °· Bruising. °· A rash that may be petechial. This looks like pinpoint, purplish-red spots on the skin and mucous membranes. It is caused by bleeding from small blood vessels (capillaries). °DIAGNOSIS  °Your caregiver will make this diagnosis based on your exam and blood tests. Sometimes, a bone marrow study is done to look for the original cells (megakaryocytes) that make platelets. °TREATMENT  °Treatment depends on the cause of the condition. °· Medicines may be given to help protect your platelets from being destroyed. °· In some cases, a replacement (transfusion) of platelets may be required to stop or prevent  bleeding. °· Sometimes, the spleen must be surgically removed. °HOME CARE INSTRUCTIONS  °· Check the skin and linings inside your mouth for bruising or bleeding as directed by your caregiver. °· Check your sputum, urine, and stool for blood as directed by your caregiver. °· Do not return to any activities that could cause bumps or bruises until your caregiver says it is okay. °· Take extra care not to cut yourself when shaving or when using scissors, needles, knives, and other tools. °· Take extra care not to burn yourself when ironing or cooking. °· Ask your caregiver if it is okay for you to drink alcohol. °· Only take over-the-counter or prescription medicines as directed by your caregiver. °· Notify all your caregivers, including dentists and eye doctors, about your condition. °SEEK IMMEDIATE MEDICAL CARE IF:  °· You develop active bleeding from anywhere in your body. °· You develop unexplained bruising or bleeding. °· You have blood in your sputum, urine, or stool. °MAKE SURE YOU: °· Understand these instructions. °· Will watch your condition. °· Will get help right away if you are not doing well or get worse. °  °This information is not intended to replace advice given to you by your health care provider. Make sure you discuss any questions you have with your health care provider. °  °Document Released: 01/01/2005 Document Revised: 03/26/2011 Document Reviewed: 07/05/2014 °Elsevier Interactive Patient Education ©2016 [employer] ° °

## 2015-01-03 NOTE — Progress Notes (Signed)
01/20/13- 20 yoF former smoker (30 pk yrs) referred courtesy of Dr Julien Nordmann for dyspnea, asking PFT incl DLCO.  Hx Multiple Myeloma in relapse, recent acute hosp summarized below.  She quit smoking in 2007 and had been diagnosed with COPD without formal testing. She has a nebulizer machine at home but has not used it in a long time.  Abrupt onset of shortness of breath "nothing goes in" as she was walking away from a chemotherapy treatment on December 30.had shortness of breath and midsternal chest pain. Used rescue inhaler with no effect. Now has chest pain on exertion "like spasm" indicating midsternal level of her chest with radiation to angles of the jaw.  Self-limited pain fades in a few minutes without palpitation or sweat. Having this pain daily. Associates with effort by climbing stairs or stocking a pantry where she has to raise her arms. No routine cough or wheeze. No history of heart disease.  Significant history of reflux for which she takes proton X. Twice daily.  Cramping spasms in right hand. Calf pain left greater than right, described as cramps.  Doppler leg veins negative 01/13/2013  2D Echo 12/26/12  Study Conclusions Left ventricle: The cavity size was normal. Wall thickness was normal. Systolic function was normal. The estimated ejection fraction was in the range of 60% to 65%. Doppler parameters are consistent with abnormal left ventricular relaxation (grade 1 diastolic dysfunction). Transthoracic echocardiography. M-mode, complete 2D, spectral Doppler, and color Doppler. Height: Height: 165.1cm. Height: 65in. Weight: Weight: 81.2kg. Weight: 178.6lb. Body mass index: BMI: 29.8kg/m^2. Body surface area: BSA: 1.70m2. Blood pressure: 131/67. Patient status: Outpatient. Location: Echo laboratory.  CXR 01/13/13  IMPRESSION:  1. Port-A-Cath in good anatomic position.  2. Borderline cardiomegaly with mild pulmonary vascular prominence.  No overt congestive heart  failure.  Electronically Signed  By: TMarcello MooresRegister  On: 01/13/2013 13:43  V/Q scan 01/13/13  IMPRESSION:  No evidence of pulmonary embolus.  Electronically Signed  By: KRolm BaptiseM.D.  On: 01/14/2013 13:58  Recent Hosp:  Admit date: 01/13/2013  Discharge date: 01/15/2013  Time spent:445mutes  Recommendations for Outpatient Follow-up:  Diastolic CHF  -Although patient's CXR did not support fluid overload patient still experiencing some SOB, it has gotten a large fluid load with PRBC x2 units and IV fluids overnight. Treated Lasix 40 mg x1  -ProBNP= 3915 upon admission  -Patient on low-dose Coreg, and will continue low-dose Lasix on discharge  -Patient will need to followup with PCP to recheck renal function, and need for titration of Coreg/Lasix  Asthma  -Will restart patient's home medication regimen even though patient has never had spirometry.  -[facility] spirometry pre-/post bronchodilator, DLCO *  -Ambulatory SpO2 on room air O2 sat between 90 and 92.  SOB  -See asthma  Hypothyroid  -Continue home medication  Multiple myeloma  Per Dr. MoInda Merlinoncology) patient received below chemotherapy treatment  PRIOR THERAPY:  1. Status post treatment with tamoxifen from November 2008 through February 2009, discontinued secondary to intolerance. 2. Status post 3 cycles of chemotherapy with Revlimid and Decadron followed by 1 cycle of Decadron only with mild response. 3. Status post 2 cycles of systemic chemotherapy with Velcade, Doxil and Decadron discontinued secondary to significant peripheral neuropathy. [facility] Status post [facility] blood [facility] of Dr. NiPhyllis GingerCURRENT THERAPY: Systemic chemotherapy with  Carfilzomib initially was 20 mg/M2 and will be increased after cycle #1 to 36 mg/M2 on days 1, 2, 8, 9, 15  and 16 every 4 weeks in addition to Cytoxan 300 mg/M2 and Decadron 40 mg by mouth weekly basis. First cycle started on 12/29/2012.  -Patient to followup with Dr. Inda Merlin (oncologist) next week for second cycle of chemotherapy  Anemia  -Secondary to above chemotherapy, H./H. stable today  HTN  -Continue Coreg  Acute /chronic renal failure  -Patient will need to continue to monitor her renal function with her PCP to ensure does not worsen secondary to chemotherapy or other medications    02/20/13- 72 yoF former smoker (30 pk yrs) referred courtesy of Dr Julien Nordmann for dyspnea, asking PFT incl DLCO. Hx Multiple Myeloma in relapse, hx breast Ca, diastolic dysfunction/ CHF, anemia, HTN, GERD, CKD FOLLOWS FOR: States still haivng chest pain and sob  Pain in mid upper spine through to sternum after pulling grocery cart. The same pain has been present intermittently, probably for much of the past year, but somewhat worse since this episode with a cart. She is pending a cardiac stress test and had an echo. She will see Dr.Nishan / Card next week. Rescue inhaler does little. Some wheeze is routine, but not cough. She feels need to take a deep breath. ECHO- 02/19/13- EF 55-60%, normal motion, normal valves and pressures PFT- 02/12/2013-moderate obstructive airways disease, insignificant response to bronchodilator, mild restriction, severe diffusion reduction. FVC 2.33/75%, FEV1 1.56/66%, FEV1/FVC 0.67, FEF 25-75% 0.97/50%, TLC 78%, DLCO 42%. Walk test 02/20/13-- 3 laps x185 feet, peak heart rate 89, lowest oxygen saturation 98% on room air.  01/03/2015-74 year old female former smoker (30-pack-year) followed for COPD, complicated by multiple myeloma, history breast CA, diastolic dysfunction/CHF, anemia, HBP, GERD, CKD FOLLOWS FOR: Pt c/o worsening SOB x 1 week and is using rescue inhaler at least once a day without relief. Pt also c/o non-prod cough, wheezing with exertion and chest tightness. Denies fever, chills,  sweats, n/v  Hemoglobin 12/27/2014-7.6. Transfusion 12/30/2014  CXR 12/02/2014 FINDINGS: The lungs are well-aerated. Pulmonary vascularity is at the upper limits of normal. There is no evidence of focal opacification, pleural effusion or pneumothorax. The heart is borderline normal in size. A right-sided chest port is noted ending about the distal SVC. No acute osseous abnormalities are seen. A clip is noted overlying the left breast. IMPRESSION: No acute cardiopulmonary process seen. Electronically Signed  By: Garald Balding M.D.  On: 12/02/2014 23:16 V/Q scan 12/03/14 IMPRESSION: Negative exam. Electronically Signed  By: Inge Rise M.D.  On: 12/03/2014 11:10    ROS-see HPI Constitutional:   No-   weight loss, night sweats, fevers, chills, fatigue, lassitude. HEENT:   No-  headaches, difficulty swallowing, tooth/dental problems, sore throat,       No-  sneezing, itching, ear ache, nasal congestion, post nasal drip,  CV:  +chest pain, no-orthopnea, PND, swelling in lower extremities, anasarca,                                                    dizziness, palpitations Resp: +shortness of breath with exertion or at rest.              No-   productive cough,  No non-productive cough,  No- coughing up of blood.  No-   change in color of mucus.  + wheezing.   Skin: No-   rash or lesions. GI:  No-   heartburn, indigestion, abdominal pain, nausea, vomiting, GU:  MS:  No-   joint pain or swelling.  .  + back pain. Neuro-     nothing unusual Psych:  No- change in mood or affect. No depression or anxiety.  No memory loss.  OBJ- Physical Exam General- Alert, Oriented, Affect-appropriate, Distress- none acute Skin- rash-none, lesions- none, excoriation- none Lymphadenopathy- none Head- atraumatic            Eyes- Gross vision intact, PERRLA, conjunctivae and secretions clear            Ears- Hearing, canals-normal            Nose- Clear, no-Septal dev, mucus,  polyps, erosion, perforation             Throat- Mallampati II , mucosa clear , drainage- none, tonsils- atrophic Neck- flexible , trachea midline, no stridor , thyroid nl, carotid no bruit Chest - symmetrical excursion , unlabored           Heart/CV- RRR , no murmur , no gallop  , no rub, nl s1 s2                           - JVD- none , edema- none, stasis changes- none, varices- none           Lung- clear to P&A, wheeze- none, cough- none , dullness-none, rub- none           Chest wall- + R Port-A-Cath, + left breast reconstruction and prosthesis                               + localized tenderness to light fist shock midthoracic spine Abd-  Br/ Gen/ Rectal- Not done, not indicated Extrem- cyanosis- none, clubbing, none, atrophy- none, strength- nl Neuro- grossly intact to observation

## 2015-01-04 ENCOUNTER — Other Ambulatory Visit: Payer: Self-pay | Admitting: *Deleted

## 2015-01-04 ENCOUNTER — Ambulatory Visit: Payer: Medicare Other

## 2015-01-06 ENCOUNTER — Other Ambulatory Visit: Payer: Self-pay | Admitting: Medical Oncology

## 2015-01-06 DIAGNOSIS — C9000 Multiple myeloma not having achieved remission: Secondary | ICD-10-CM

## 2015-01-11 ENCOUNTER — Ambulatory Visit (HOSPITAL_BASED_OUTPATIENT_CLINIC_OR_DEPARTMENT_OTHER): Payer: Medicare Other | Admitting: Nurse Practitioner

## 2015-01-11 ENCOUNTER — Other Ambulatory Visit (HOSPITAL_BASED_OUTPATIENT_CLINIC_OR_DEPARTMENT_OTHER): Payer: Medicare Other

## 2015-01-11 ENCOUNTER — Other Ambulatory Visit: Payer: Medicare Other

## 2015-01-11 ENCOUNTER — Ambulatory Visit (HOSPITAL_BASED_OUTPATIENT_CLINIC_OR_DEPARTMENT_OTHER): Payer: Medicare Other

## 2015-01-11 VITALS — BP 112/45 | HR 78 | Temp 99.1°F | Resp 18 | Ht 65.5 in | Wt 153.4 lb

## 2015-01-11 DIAGNOSIS — C9000 Multiple myeloma not having achieved remission: Secondary | ICD-10-CM

## 2015-01-11 DIAGNOSIS — Z5111 Encounter for antineoplastic chemotherapy: Secondary | ICD-10-CM

## 2015-01-11 DIAGNOSIS — K1231 Oral mucositis (ulcerative) due to antineoplastic therapy: Secondary | ICD-10-CM

## 2015-01-11 DIAGNOSIS — Z5112 Encounter for antineoplastic immunotherapy: Secondary | ICD-10-CM

## 2015-01-11 DIAGNOSIS — R530 Neoplastic (malignant) related fatigue: Secondary | ICD-10-CM | POA: Diagnosis not present

## 2015-01-11 DIAGNOSIS — N289 Disorder of kidney and ureter, unspecified: Secondary | ICD-10-CM | POA: Diagnosis not present

## 2015-01-11 LAB — CBC WITH DIFFERENTIAL/PLATELET
BASO%: 1.4 % (ref 0.0–2.0)
BASOS ABS: 0.1 10*3/uL (ref 0.0–0.1)
EOS%: 4.8 % (ref 0.0–7.0)
Eosinophils Absolute: 0.3 10*3/uL (ref 0.0–0.5)
HEMATOCRIT: 34.6 % — AB (ref 34.8–46.6)
HGB: 11.1 g/dL — ABNORMAL LOW (ref 11.6–15.9)
LYMPH#: 1.5 10*3/uL (ref 0.9–3.3)
LYMPH%: 25 % (ref 14.0–49.7)
MCH: 33.2 pg (ref 25.1–34.0)
MCHC: 32 g/dL (ref 31.5–36.0)
MCV: 103.5 fL — ABNORMAL HIGH (ref 79.5–101.0)
MONO#: 1.1 10*3/uL — AB (ref 0.1–0.9)
MONO%: 19.6 % — ABNORMAL HIGH (ref 0.0–14.0)
NEUT#: 2.9 10*3/uL (ref 1.5–6.5)
NEUT%: 49.2 % (ref 38.4–76.8)
PLATELETS: 115 10*3/uL — AB (ref 145–400)
RBC: 3.35 10*6/uL — ABNORMAL LOW (ref 3.70–5.45)
RDW: 21.6 % — ABNORMAL HIGH (ref 11.2–14.5)
WBC: 5.8 10*3/uL (ref 3.9–10.3)

## 2015-01-11 LAB — COMPREHENSIVE METABOLIC PANEL
ALT: 21 U/L (ref 0–55)
ANION GAP: 11 meq/L (ref 3–11)
AST: 19 U/L (ref 5–34)
Albumin: 3.9 g/dL (ref 3.5–5.0)
Alkaline Phosphatase: 120 U/L (ref 40–150)
BUN: 31.3 mg/dL — ABNORMAL HIGH (ref 7.0–26.0)
CALCIUM: 9.4 mg/dL (ref 8.4–10.4)
CHLORIDE: 107 meq/L (ref 98–109)
CO2: 24 mEq/L (ref 22–29)
Creatinine: 3.4 mg/dL (ref 0.6–1.1)
EGFR: 13 mL/min/{1.73_m2} — AB (ref >90)
Glucose: 92 mg/dl (ref 70–140)
POTASSIUM: 4.9 meq/L (ref 3.5–5.1)
Sodium: 142 mEq/L (ref 136–145)
Total Bilirubin: 0.99 mg/dL (ref 0.20–1.20)
Total Protein: 6.2 g/dL — ABNORMAL LOW (ref 6.4–8.3)

## 2015-01-11 MED ORDER — SODIUM CHLORIDE 0.9 % IJ SOLN
10.0000 mL | INTRAMUSCULAR | Status: DC | PRN
  Administered 2015-01-11: 10 mL
  Filled 2015-01-11: qty 10

## 2015-01-11 MED ORDER — SODIUM CHLORIDE 0.9 % IV SOLN
Freq: Once | INTRAVENOUS | Status: AC
  Administered 2015-01-11: 15:00:00 via INTRAVENOUS
  Filled 2015-01-11: qty 4

## 2015-01-11 MED ORDER — HEPARIN SOD (PORK) LOCK FLUSH 100 UNIT/ML IV SOLN
500.0000 [IU] | Freq: Once | INTRAVENOUS | Status: AC | PRN
  Administered 2015-01-11: 500 [IU]
  Filled 2015-01-11: qty 5

## 2015-01-11 MED ORDER — DEXTROSE 5 % IV SOLN
37.0000 mg/m2 | Freq: Once | INTRAVENOUS | Status: AC
  Administered 2015-01-11: 70 mg via INTRAVENOUS
  Filled 2015-01-11: qty 35

## 2015-01-11 MED ORDER — SODIUM CHLORIDE 0.9 % IV SOLN
Freq: Once | INTRAVENOUS | Status: AC
  Administered 2015-01-11: 14:00:00 via INTRAVENOUS

## 2015-01-11 MED ORDER — PILOCARPINE HCL 5 MG PO TABS: ORAL_TABLET | ORAL | Status: DC

## 2015-01-11 MED ORDER — SODIUM CHLORIDE 0.9 % IV SOLN
300.0000 mg/m2 | Freq: Once | INTRAVENOUS | Status: AC
  Administered 2015-01-11: 560 mg via INTRAVENOUS
  Filled 2015-01-11: qty 28

## 2015-01-11 MED ORDER — SODIUM CHLORIDE 0.9 % IV SOLN
Freq: Once | INTRAVENOUS | Status: AC
  Administered 2015-01-11: 15:00:00 via INTRAVENOUS

## 2015-01-11 NOTE — Progress Notes (Signed)
Okay to treat with today's Creatinine per Dr. Julien Nordmann.

## 2015-01-11 NOTE — Progress Notes (Signed)
Patient ID: Nancy Norris, female   DOB: December 17, 1940, 74 y.o.   MRN: [id number]  [place], [address], [place]  [address] Phone: [phone] Fax:  [phone]  Date:  01/12/2015   ID:  Nancy Norris, DOB 09-Oct-1940, MRN [id number]  PCP:  [name], MD  Cardiologist:  Dr. Jenkins Rouge  Nephrologist:  Dr. Marval Regal    History of Present Illness: Nancy Norris is a 74 y.o. female with a hx of recurrent multiple myeloma (s/p stem cell transplant in 2010), COPD, hypothyroidism, ductal carcinoma in situ status post mastectomy, CKD, HTN, GERD. She was first seen by me in 2012  for surgical clearance. She had an abnormal ECG.  An echocardiogram demonstrated normal LV function and she was cleared for surgery. F/U Echocardiogram (12/26/2012): EF 13-24%, grade 1 diastolic dysfunction.  She was recently admitted 12/30-01/15/14  with abrupt onset of dyspnea (while coming back from chemotherapy).  She was profoundly anemic with a hemoglobin of 6.8. She was transfused with packed red blood cells. She had worsening renal function. Chest CT angiogram was not performed. VQ scan was negative for pulmonary embolism.  LE venous Dopplers were negative for DVT bilaterally.  There was some concern for volume overload and she was treated with IV Lasix x1.  She was evaluated by Dr. Annamaria Boots for pulmonology. PFTs have been scheduled. Patient noted chest discomfort as well and she has been referred back to cardiology for evaluation.  On dialysis now with fistula  Dizzy  Chest pains resolved  Normal myovue 2/15   Significant postural sympotms Off Florinef now due to fluid retention  Echo 04/09/14 reviewed  Impressions:  - Normal LV size and systolic function, EF 40-10%. Moderate diastolic dysfunction. Pseudonormal  Normal RV size and systolic function. Mild mitral regurgitation.   Lab Results  Component Value Date   CREATININE 3.4* 01/11/2015   Lab Results  Component Value Date   HCT  34.6* 01/11/2015   Last visit started on midodrine to support BP for dialysis     Recent Labs: 07/13/2014: B Natriuretic Peptide 968.2* 08/13/2014: HDL 40*; LDL Cholesterol 86 12/03/2014: TSH 0.123* 01/11/2015: ALT 21; Creatinine 3.4*; HGB 11.1*; Potassium 4.9  Wt Readings from Last 3 Encounters:  01/12/15 71.578 kg (157 lb 12.8 oz)  01/11/15 69.582 kg (153 lb 6.4 oz)  01/03/15 72.938 kg (160 lb 12.8 oz)     Past Medical History  Diagnosis Date  . Hyperkalemia   . Hypothyroidism   . COPD (chronic obstructive pulmonary disease) ([place])   . Hyponatremia   . Dizziness   . Fibromyalgia   . Breast cancer ([place])   . Multiple myeloma   . Mucositis   . Hx of echocardiogram     a.  Echocardiogram (12/26/2012): EF 27-25%, grade 1 diastolic dysfunction;   b.  Echocardiogram (02/2013): EF 55-60%, no WMA, trivial effusion  . Fibromyalgia   . Arthritis   . Hx of cardiovascular stress test     LexiScan with low level exercise Myoview (02/2013): No ischemia, EF 72%; normal study  . Complication of anesthesia   . PONV (postoperative nausea and vomiting) 2008    after mastestomy  . Myocardial infarction [facility])     in past, patient was unaware.   . Anginal pain ([place])     used NTG x 2 May 31 and 06/15/13   . GERD (gastroesophageal reflux disease)   . Headache(784.0)   . Anxiety   . Depression   . CKD (  chronic kidney disease) stage 3, GFR 30-59 ml/min   . Anemia   . History of blood transfusion     last one May 12   . Neuropathy ([place])   . B12 deficiency 12/04/2014    Current Outpatient Prescriptions  Medication Sig Dispense Refill  . acetaminophen (TYLENOL) 325 MG tablet Take 650 mg by mouth every 6 (six) hours as needed for mild pain or moderate pain.     Marland Kitchen albuterol (PROVENTIL HFA;VENTOLIN HFA) 108 (90 BASE) MCG/ACT inhaler Inhale 2 puffs into the lungs 2 (two) times daily as needed for wheezing or shortness of breath. 1 each 6  . ascorbic acid (VITAMIN C) 250 MG CHEW Chew 250 mg by  mouth daily.    . benzonatate (TESSALON) 200 MG capsule Take 1 capsule (200 mg total) by mouth 3 (three) times daily as needed for cough. 30 capsule 4  . buPROPion (WELLBUTRIN XL) 300 MG 24 hr tablet Take 300 mg by mouth daily.   98  . carboxymethylcellulose (REFRESH PLUS) 0.5 % SOLN Place 1 drop into both eyes every 2 (two) hours.    . Cholecalciferol 4000 UNITS TABS Take 4,000 Units by mouth daily.    . citalopram (CELEXA) 40 MG tablet Take 40 mg by mouth daily.    . cromolyn (OPTICROM) 4 % ophthalmic solution Place 1 drop into both eyes 4 (four) times daily.    Marland Kitchen dexamethasone (DECADRON) 4 MG tablet Take 4 mg by mouth once a week.    . esomeprazole (NEXIUM) 40 MG capsule TAKE 1 CAPSULE BY MOUTH DAILY, EACH MORNING FOR ACID INDIGESTION 30 capsule PRN  . Ferrous Sulfate 134 MG TABS Take 1 tablet (134 mg total) by mouth every other day. 60 tablet 2  . gabapentin (NEURONTIN) 300 MG capsule TAKE 1 CAPSULE BY MOUTH AT BEDTIME. 90 capsule 3  . hydrOXYzine (ATARAX/VISTARIL) 25 MG tablet Take 1 tablet by mouth 3 (three) times daily as needed for vomiting. Reported on 01/11/2015  1  . isosorbide mononitrate (IMDUR) 30 MG 24 hr tablet TAKE 1 TABLET BY MOUTH DAILY. 30 tablet 11  . levothyroxine (SYNTHROID, LEVOTHROID) 150 MCG tablet TAKE 1 TABLET BY MOUTH ONCE DAILY. 90 tablet 1  . loperamide (IMODIUM) 2 MG capsule Take two tabs po initially, then one tab after each loose stool: max 8 tabs in 24 hours 30 capsule 0  . loratadine (CLARITIN) 10 MG tablet Take 10 mg by mouth daily.    Marland Kitchen LORazepam (ATIVAN) 0.5 MG tablet Take 1 tablet (0.5 mg total) by mouth every 8 (eight) hours as needed (nausea). 30 tablet 0  . loteprednol (ALREX) 0.2 % SUSP Place 1 drop into both eyes 4 (four) times daily. 1 Bottle PRN  . meclizine (ANTIVERT) 32 MG tablet Take 32 mg by mouth 3 (three) times daily as needed for dizziness.    . midodrine (PROAMATINE) 10 MG tablet Take 1 tablet (10 mg total) by mouth 3 (three) times daily. 90  tablet 11  . nitroGLYCERIN (NITROSTAT) 0.4 MG SL tablet Place 0.4 mg under the tongue every 5 (five) minutes as needed for chest pain.    Marland Kitchen ondansetron (ZOFRAN-ODT) 8 MG disintegrating tablet Take 1 tablet (8 mg total) by mouth every 8 (eight) hours as needed for nausea or vomiting. 20 tablet 3  . pilocarpine (SALAGEN) 5 MG tablet TAKE 1 TABLET BY MOUTH 3 TIMES DAILY. 90 tablet 1  . prochlorperazine (COMPAZINE) 10 MG tablet Take 10 mg by mouth every 6 (six) hours as  needed for nausea or vomiting.    . ranitidine (ZANTAC) 300 MG tablet TAKE 1 TABLET BY MOUTH AT BEDTIME, FOR ACID REFLUX AND INDIGESTION. 30 tablet PRN  . RESTASIS 0.05 % ophthalmic emulsion Place 1 drop into both eyes 2 (two) times daily.   4  . senna (SENOKOT) 8.6 MG tablet Take 1 tablet by mouth at bedtime.    . sodium bicarbonate 650 MG tablet Take 1 tablet (650 mg total) by mouth 2 (two) times daily. 60 tablet 0  . Umeclidinium-Vilanterol (ANORO ELLIPTA) 62.5-25 MCG/INH AEPB Inhale 1 puff into the lungs daily.     No current facility-administered medications for this visit.   Facility-Administered Medications Ordered in Other Visits  Medication Dose Route Frequency Provider Last Rate Last Dose  . 0.9 %  sodium chloride infusion   Intravenous Once Adrena E Johnson, PA-C      . sodium chloride 0.9 % injection 10 mL  10 mL Intracatheter PRN Curt Bears, MD   10 mL at 01/05/13 1825  . sodium chloride 0.9 % injection 10 mL  10 mL Intracatheter PRN Curt Bears, MD   10 mL at 06/15/14 1514    Allergies:   Codeine; Latex; Other; Onion; Zyprexa; Adhesive; Iodinated diagnostic agents; and Sulfa antibiotics   Social History:  The patient  reports that she quit smoking about 9 years ago. Her smoking use included Cigarettes. She has a 30 pack-year smoking history. She has never used smokeless tobacco. She reports that she does not drink alcohol or use illicit drugs.   Family History:  The patient's family history includes  Arthritis in her mother; Asthma in her mother; Cancer in her sister; Hyperlipidemia in her brother.   ROS:  Please see the history of present illness.   She denies fevers, chills, cough, melena, hematochezia.   All other systems reviewed and negative.   PHYSICAL EXAM: VS:  BP 116/58 mmHg  Pulse 72  Ht 5' 5.5" (1.664 m)  Wt 71.578 kg (157 lb 12.8 oz)  BMI 25.85 kg/m2  SpO2 97% BP 130 / palp standing with no  dizzyness  Well nourished, well developed, in no acute distress HEENT: normal Neck: no JVD Vascular: left carotid  Chest: Tender to palpation Cardiac:  normal S1, S2; RRR; no murmurno rub Lungs:  clear to auscultation bilaterally, no wheezing, rhonchi or rales Abd: soft, + epigastric tenderness, no hepatomegaly Ext: no edema Skin: warm and dry Neuro:  CNs 2-12 intact, no focal abnormalities noted Large fistula in LUE with thrill   EKG:   04/20/14  NSR, HR 65, normal axis, nonspecific ST-T wave changes     ASSESSMENT AND PLAN:  1. Chest Pain: resolved two normal myovue's last year 2. Postural Hypotension improved off florinef continue midodrine  3. CKD: Continue follow up with nephrology. Some LUE steal from fistula Cr  3,4 range  4. Multiple Myeloma: Continue follow up with oncology. Primary issue is renal impact/failure  5. COPD: Formal testing is pending. Follow up with pulmonology as planned. 6.  Left bruit duplex no stenosis 03/2014  F/u duplex 03/2015   F/u with me in a year Duplex carotid 03/2015  Jenkins Rouge

## 2015-01-11 NOTE — Patient Instructions (Addendum)
[facility] Discharge Instructions for Patients Receiving Chemotherapy  Today you received the following chemotherapy agents Cytoxan and Kyprolis  To help prevent nausea and vomiting after your treatment, we encourage you to take your nausea medication as directed   If you develop nausea and vomiting that is not controlled by your nausea medication, call the clinic.   BELOW ARE SYMPTOMS THAT SHOULD BE REPORTED IMMEDIATELY:  *FEVER GREATER THAN 100.5 F  *CHILLS WITH OR WITHOUT FEVER  NAUSEA AND VOMITING THAT IS NOT CONTROLLED WITH YOUR NAUSEA MEDICATION  *UNUSUAL SHORTNESS OF BREATH  *UNUSUAL BRUISING OR BLEEDING  TENDERNESS IN MOUTH AND THROAT WITH OR WITHOUT PRESENCE OF ULCERS  *URINARY PROBLEMS  *BOWEL PROBLEMS  UNUSUAL RASH Items with * indicate a potential emergency and should be followed up as soon as possible.  Feel free to call the clinic you have any questions or concerns. The clinic phone number is [phone].  Please show the CHEMO ALERT CARD at check-in to the Emergency Department and triage nurse.   

## 2015-01-12 ENCOUNTER — Encounter: Payer: Self-pay | Admitting: Nurse Practitioner

## 2015-01-12 ENCOUNTER — Ambulatory Visit (INDEPENDENT_AMBULATORY_CARE_PROVIDER_SITE_OTHER): Payer: Medicare Other | Admitting: Cardiovascular Disease

## 2015-01-12 ENCOUNTER — Encounter: Payer: Self-pay | Admitting: Cardiovascular Disease

## 2015-01-12 ENCOUNTER — Ambulatory Visit (HOSPITAL_BASED_OUTPATIENT_CLINIC_OR_DEPARTMENT_OTHER): Payer: Medicare Other

## 2015-01-12 VITALS — BP 116/58 | HR 72 | Ht 65.5 in | Wt 157.8 lb

## 2015-01-12 VITALS — BP 118/48 | HR 74 | Temp 98.5°F | Resp 18

## 2015-01-12 DIAGNOSIS — Z5112 Encounter for antineoplastic immunotherapy: Secondary | ICD-10-CM

## 2015-01-12 DIAGNOSIS — R0989 Other specified symptoms and signs involving the circulatory and respiratory systems: Secondary | ICD-10-CM

## 2015-01-12 DIAGNOSIS — I10 Essential (primary) hypertension: Secondary | ICD-10-CM

## 2015-01-12 DIAGNOSIS — C9000 Multiple myeloma not having achieved remission: Secondary | ICD-10-CM

## 2015-01-12 MED ORDER — HEPARIN SOD (PORK) LOCK FLUSH 100 UNIT/ML IV SOLN
500.0000 [IU] | Freq: Once | INTRAVENOUS | Status: AC | PRN
  Administered 2015-01-12: 500 [IU]
  Filled 2015-01-12: qty 5

## 2015-01-12 MED ORDER — SODIUM CHLORIDE 0.9 % IV SOLN
Freq: Once | INTRAVENOUS | Status: AC
  Administered 2015-01-12: 13:00:00 via INTRAVENOUS

## 2015-01-12 MED ORDER — DEXTROSE 5 % IV SOLN
37.0000 mg/m2 | Freq: Once | INTRAVENOUS | Status: AC
  Administered 2015-01-12: 70 mg via INTRAVENOUS
  Filled 2015-01-12: qty 35

## 2015-01-12 MED ORDER — SODIUM CHLORIDE 0.9 % IV SOLN
Freq: Once | INTRAVENOUS | Status: AC
  Administered 2015-01-12: 13:00:00 via INTRAVENOUS
  Filled 2015-01-12: qty 4

## 2015-01-12 MED ORDER — SODIUM CHLORIDE 0.9 % IJ SOLN
10.0000 mL | INTRAMUSCULAR | Status: DC | PRN
  Administered 2015-01-12: 10 mL
  Filled 2015-01-12: qty 10

## 2015-01-12 NOTE — Assessment & Plan Note (Signed)
Patient complains of chronic fatigue is secondary to her chemotherapy.  She is requesting a three-month chemotherapy holiday to recover.  Patient was advised to push fluids and to remain as active as possible.

## 2015-01-12 NOTE — Patient Instructions (Signed)
[facility] Discharge Instructions for Patients Receiving Chemotherapy  Today you received the following chemotherapy agents: Kyprolis   To help prevent nausea and vomiting after your treatment, we encourage you to take your nausea medication as directed.    If you develop nausea and vomiting that is not controlled by your nausea medication, call the clinic.   BELOW ARE SYMPTOMS THAT SHOULD BE REPORTED IMMEDIATELY:  *FEVER GREATER THAN 100.5 F  *CHILLS WITH OR WITHOUT FEVER  NAUSEA AND VOMITING THAT IS NOT CONTROLLED WITH YOUR NAUSEA MEDICATION  *UNUSUAL SHORTNESS OF BREATH  *UNUSUAL BRUISING OR BLEEDING  TENDERNESS IN MOUTH AND THROAT WITH OR WITHOUT PRESENCE OF ULCERS  *URINARY PROBLEMS  *BOWEL PROBLEMS  UNUSUAL RASH Items with * indicate a potential emergency and should be followed up as soon as possible.  Feel free to call the clinic you have any questions or concerns. The clinic phone number is [phone].  Please show the CHEMO ALERT CARD at check-in to the Emergency Department and triage nurse.   

## 2015-01-12 NOTE — Patient Instructions (Addendum)
Medication Instructions:  Your physician recommends that you continue on your current medications as directed. Please refer to the Current Medication list given to you today.   Labwork: None ordered    Testing/Procedures: Your physician has requested that you have a carotid duplex after 04/01/15.  This test is an ultrasound of the carotid arteries in your neck. It looks at blood flow through these arteries that supply the brain with blood. Allow one hour for this exam. There are no restrictions or special instructions.    Follow-Up: Your physician wants you to follow-up in: [place].  You will receive a reminder letter in the mail two months in advance. If you don't receive a letter, please call our office to schedule the follow-up appointment.   Any Other Special Instructions Will Be Listed Below (If Applicable).   If you need a refill on your cardiac medications before your next appointment, please call your pharmacy.

## 2015-01-12 NOTE — Assessment & Plan Note (Signed)
Patient has trace mucositis lesions to the right side of her tongue only.  She is requesting a was given a refill of her Salagen.  She also has nystatin swish and spit to use at home as well.

## 2015-01-12 NOTE — Assessment & Plan Note (Signed)
Patient has history of chronic renal insufficiency; creatinine elevated to 3.4 today.  We'll continue to monitor closely.

## 2015-01-12 NOTE — Progress Notes (Signed)
SYMPTOM MANAGEMENT CLINIC   HPI: Nancy Norris 74 y.o. female diagnosed with multiple myeloma.  Currently undergoing Cytoxan/Kyprolis chemotherapy therapy regimen.   [facility] to receive cycle 7, day 15 of her Cytoxan/Kyprolis chemotherapy regimen.  She complains of some fatigue and trace mucositis only.  She denies any other new symptoms whatsoever.  She denies any recent fevers or chills.  She is requesting a refill of the Salagen for treatment of the mucositis.  Patient is requesting a three-month chemotherapy holiday to allow for full recovery.  This request was reviewed with Dr. Julien Nordmann; he was in agreement.  All future appointments will be canceled; and patient will be rescheduled for a follow-up visit around 04/11/2015.  Patient will require labs in approximately one month prior to her next follow-up visit.  Patient is to call in the interim if any new worries or concerns whatsoever.  HPI  ROS  Past Medical History  Diagnosis Date  . Hyperkalemia   . Hypothyroidism   . COPD (chronic obstructive pulmonary disease) ([place])   . Hyponatremia   . Dizziness   . Fibromyalgia   . Breast cancer ([place])   . Multiple myeloma   . Mucositis   . Hx of echocardiogram     a.  Echocardiogram (12/26/2012): EF 93-23%, grade 1 diastolic dysfunction;   b.  Echocardiogram (02/2013): EF 55-60%, no WMA, trivial effusion  . Fibromyalgia   . Arthritis   . Hx of cardiovascular stress test     LexiScan with low level exercise Myoview (02/2013): No ischemia, EF 72%; normal study  . Complication of anesthesia   . PONV (postoperative nausea and vomiting) 2008    after mastestomy  . Myocardial infarction [facility])     in past, patient was unaware.   . Anginal pain ([place])     used NTG x 2 May 31 and 06/15/13   . GERD (gastroesophageal reflux disease)   . Headache(784.0)   . Anxiety   . Depression   . CKD (chronic kidney disease) stage 3, GFR 30-59 ml/min   . Anemia     . History of blood transfusion     last one May 12   . Neuropathy ([place])   . B12 deficiency 12/04/2014    Past Surgical History  Procedure Laterality Date  . History of port removal    . Status post stem cell transplant on September 28, 2008.    Marland Kitchen Abdominal hysterectomy  1981  . Cholecystectomy  1971  . Mastectomy Left 2008  . Cataract extraction, bilateral    . Breast surgery    . Eye surgery Bilateral     lens implant  . Breast reconstruction    . Portacath placement  12/2012    has had 2  . Av fistula placement Left 06/19/2013    Procedure: CREATION OF LEFT ARM ARTERIOVENOUS (AV) FISTULA ;  Surgeon: Angelia Mould, MD;  Location: [facility];  Service: Vascular;  Laterality: Left;    has Hypercalcemia; HX: breast cancer; Asthma, chronic; Hypothyroid; Chronic diastolic heart failure (HCC); HTN (hypertension); GERD (gastroesophageal reflux disease); COPD (chronic obstructive pulmonary disease) with emphysema ([place]); Fibromyalgia; Mixed hyperlipidemia; Prediabetes; Vitamin D deficiency; Medication management; Depression, controlled; Multiple myeloma ([place]); Orthostatic hypotension; CKD (chronic kidney disease) stage 5, GFR less than 15 ml/min (HCC); Nausea with vomiting; Renal insufficiency; Dehydration; Hypoalbuminemia; Encounter for antineoplastic chemotherapy; CHF (congestive heart failure) ([place]); Dizziness; Malnutrition of moderate degree; B12 deficiency; Thrombocytopenia ([place]); SOB (shortness of breath);  Nausea & vomiting; Neoplastic malignant related fatigue; and Mucositis due to antineoplastic therapy on her problem list.    is allergic to codeine; latex; other; onion; zyprexa; adhesive; iodinated diagnostic agents; and sulfa antibiotics.    Medication List       This list is accurate as of: 01/11/15 11:59 PM.  Always use your most recent med list.               acetaminophen 325 MG tablet  Commonly known as:  TYLENOL  Take 650 mg by mouth every 6 (six) hours as needed  for mild pain or moderate pain.     albuterol 108 (90 Base) MCG/ACT inhaler  Commonly known as:  PROVENTIL HFA;VENTOLIN HFA  Inhale 2 puffs into the lungs 2 (two) times daily as needed for wheezing or shortness of breath.     ANORO ELLIPTA 62.5-25 MCG/INH Aepb  Generic drug:  Umeclidinium-Vilanterol  Inhale 1 puff into the lungs daily.     ascorbic acid 250 MG Chew  Commonly known as:  VITAMIN C  Chew 250 mg by mouth daily.     benzonatate 200 MG capsule  Commonly known as:  TESSALON  Take 1 capsule (200 mg total) by mouth 3 (three) times daily as needed for cough.     buPROPion 300 MG 24 hr tablet  Commonly known as:  WELLBUTRIN XL  Take 300 mg by mouth daily.     carboxymethylcellulose 0.5 % Soln  Commonly known as:  REFRESH PLUS  Place 1 drop into both eyes every 2 (two) hours.     Cholecalciferol 4000 units Tabs  Take 4,000 Units by mouth daily.     citalopram 40 MG tablet  Commonly known as:  CELEXA  Take 0.5 tablets (20 mg total) by mouth daily.     cromolyn 4 % ophthalmic solution  Commonly known as:  OPTICROM  Place 1 drop into both eyes 4 (four) times daily.     dexamethasone 4 MG tablet  Commonly known as:  DECADRON  Take 10 tablets by mouth once a week as directed     esomeprazole 40 MG capsule  Commonly known as:  NEXIUM  TAKE 1 CAPSULE BY MOUTH DAILY, EACH MORNING FOR ACID INDIGESTION     Ferrous Sulfate 134 MG Tabs  Take 1 tablet (134 mg total) by mouth every other day.     fluconazole 150 MG tablet  Commonly known as:  DIFLUCAN  Take one tab by mouth. Repeat in 3 days if symptoms persist.     gabapentin 300 MG capsule  Commonly known as:  NEURONTIN  TAKE 1 CAPSULE BY MOUTH AT BEDTIME.     hydrOXYzine 25 MG tablet  Commonly known as:  ATARAX/VISTARIL  Take 1 tablet by mouth 3 (three) times daily as needed for vomiting. Reported on 01/11/2015     isosorbide mononitrate 30 MG 24 hr tablet  Commonly known as:  IMDUR  TAKE 1 TABLET BY MOUTH  DAILY.     levothyroxine 150 MCG tablet  Commonly known as:  SYNTHROID, LEVOTHROID  TAKE 1 TABLET BY MOUTH ONCE DAILY.     loperamide 2 MG capsule  Commonly known as:  IMODIUM  Take two tabs po initially, then one tab after each loose stool: max 8 tabs in 24 hours     loratadine 10 MG tablet  Commonly known as:  CLARITIN  Take 10 mg by mouth daily.     LORazepam 0.5 MG tablet  Commonly known as:  ATIVAN  Take 1 tablet (0.5 mg total) by mouth every 8 (eight) hours as needed (nausea).     loteprednol 0.2 % Susp  Commonly known as:  ALREX  Place 1 drop into both eyes 4 (four) times daily.     meclizine 32 MG tablet  Commonly known as:  ANTIVERT  Take 1 tablet (32 mg total) by mouth 3 (three) times daily as needed.     midodrine 10 MG tablet  Commonly known as:  PROAMATINE  Take 1 tablet (10 mg total) by mouth 3 (three) times daily.     nitroGLYCERIN 0.4 MG SL tablet  Commonly known as:  NITROSTAT  Place 1 tablet (0.4 mg total) under the tongue every 5 (five) minutes as needed for chest pain.     ondansetron 8 MG disintegrating tablet  Commonly known as:  ZOFRAN-ODT  Take 1 tablet (8 mg total) by mouth every 8 (eight) hours as needed for nausea or vomiting.     pilocarpine 5 MG tablet  Commonly known as:  SALAGEN  TAKE 1 TABLET BY MOUTH 3 TIMES DAILY.     prochlorperazine 10 MG tablet  Commonly known as:  COMPAZINE  TAKE 1 TABLET BY MOUTH EVERY 6 HOURS AS NEEDED FOR NAUSEA OR VOMITING.     ranitidine 300 MG tablet  Commonly known as:  ZANTAC  TAKE 1 TABLET BY MOUTH AT BEDTIME, FOR ACID REFLUX AND INDIGESTION.     RESTASIS 0.05 % ophthalmic emulsion  Generic drug:  cycloSPORINE  Place 1 drop into both eyes 2 (two) times daily.     senna 8.6 MG tablet  Commonly known as:  SENOKOT  Take 1 tablet by mouth at bedtime.     sodium bicarbonate 650 MG tablet  Take 1 tablet (650 mg total) by mouth 2 (two) times daily.         PHYSICAL EXAMINATION  Oncology Vitals  01/12/2015 01/12/2015  Height - 166 cm  Weight - 71.578 kg  Weight (lbs) - 157 lbs 13 oz  BMI (kg/m2) - 25.86 kg/m2  Temp 98.5 -  Pulse 74 72  Resp 18 -  Resp (Historical as of 08/16/11) - -  SpO2 - 97  BSA (m2) - 1.82 m2   BP Readings from Last 2 Encounters:  01/12/15 118/48  01/12/15 116/58    Physical Exam  Constitutional: She is oriented to person, place, and time and well-developed, well-nourished, and in no distress.  HENT:  Head: Normocephalic and atraumatic.  Trace lesions to the right side of patient's tongue only.  Eyes: Conjunctivae and EOM are normal. Pupils are equal, round, and reactive to light. Right eye exhibits no discharge. Left eye exhibits no discharge. No scleral icterus.  Neck: Normal range of motion.  Pulmonary/Chest: Effort normal. No respiratory distress.  Musculoskeletal: Normal range of motion.  Neurological: She is alert and oriented to person, place, and time. Gait normal.  Skin: Skin is warm and dry. No rash noted. No erythema. No pallor.  Psychiatric: Affect normal.  Nursing note and vitals reviewed.   LABORATORY DATA:. Appointment on 01/11/2015  Component Date Value Ref Range Status  . WBC 01/11/2015 5.8  3.9 - 10.3 10e3/uL Final  . NEUT# 01/11/2015 2.9  1.5 - 6.5 10e3/uL Final  . HGB 01/11/2015 11.1* 11.6 - 15.9 g/dL Final  . HCT 01/11/2015 34.6* 34.8 - 46.6 % Final  . Platelets 01/11/2015 115* 145 - 400 10e3/uL Final  . MCV 01/11/2015 103.5* 79.5 - 101.0 fL Final  .  MCH 01/11/2015 33.2  25.1 - 34.0 pg Final  . MCHC 01/11/2015 32.0  31.5 - 36.0 g/dL Final  . RBC 01/11/2015 3.35* 3.70 - 5.45 10e6/uL Final  . RDW 01/11/2015 21.6* 11.2 - 14.5 % Final  . lymph# 01/11/2015 1.5  0.9 - 3.3 10e3/uL Final  . MONO# 01/11/2015 1.1* 0.1 - 0.9 10e3/uL Final  . Eosinophils Absolute 01/11/2015 0.3  0.0 - 0.5 10e3/uL Final  . Basophils Absolute 01/11/2015 0.1  0.0 - 0.1 10e3/uL Final  . NEUT% 01/11/2015 49.2  38.4 - 76.8 % Final  . LYMPH% 01/11/2015  25.0  14.0 - 49.7 % Final  . MONO% 01/11/2015 19.6* 0.0 - 14.0 % Final  . EOS% 01/11/2015 4.8  0.0 - 7.0 % Final  . BASO% 01/11/2015 1.4  0.0 - 2.0 % Final  . Sodium 01/11/2015 142  136 - 145 mEq/L Final  . Potassium 01/11/2015 4.9  3.5 - 5.1 mEq/L Final  . Chloride 01/11/2015 107  98 - 109 mEq/L Final  . CO2 01/11/2015 24  22 - 29 mEq/L Final  . Glucose 01/11/2015 92  70 - 140 mg/dl Final   Glucose reference range is for nonfasting patients. Fasting glucose reference range is 70- 100.  Marland Kitchen BUN 01/11/2015 31.3* 7.0 - 26.0 mg/dL Final  . Creatinine 01/11/2015 3.4* 0.6 - 1.1 mg/dL Final  . Total Bilirubin 01/11/2015 0.99  0.20 - 1.20 mg/dL Final  . Alkaline Phosphatase 01/11/2015 120  40 - 150 U/L Final  . AST 01/11/2015 19  5 - 34 U/L Final  . ALT 01/11/2015 21  0 - 55 U/L Final  . Total Protein 01/11/2015 6.2* 6.4 - 8.3 g/dL Final  . Albumin 01/11/2015 3.9  3.5 - 5.0 g/dL Final  . Calcium 01/11/2015 9.4  8.4 - 10.4 mg/dL Final  . Anion Gap 01/11/2015 11  3 - 11 mEq/L Final  . EGFR 01/11/2015 13* >90 ml/min/1.73 m2 Final   eGFR is calculated using the CKD-EPI Creatinine Equation (2009)     RADIOGRAPHIC STUDIES: No results found.  ASSESSMENT/PLAN:    Multiple myeloma [facility]) [facility] to [facility] cycle 7, day 15 of her Cytoxan/Kyprolis chemotherapy regimen.  She complains of some fatigue and trace mucositis only.  She denies any other new symptoms whatsoever.  She denies any recent fevers or chills.  She is requesting a refill of the Salagen for treatment of the mucositis.  Patient is requesting a three-month chemotherapy holiday to allow for full recovery.  This request was reviewed with Dr. Julien Nordmann; he was in agreement.  All future appointments will be canceled; and patient will be rescheduled for a follow-up visit around 04/11/2015.  Patient will require labs in approximately one month prior to her next follow-up visit.  Patient is to call in the  interim if any new worries or concerns whatsoever.  Renal insufficiency Patient has history of chronic renal insufficiency; creatinine elevated to 3.4 today.  We'll continue to monitor closely.  Neoplastic malignant related fatigue Patient complains of chronic fatigue is secondary to her chemotherapy.  She is requesting a three-month chemotherapy holiday to recover.  Patient was advised to push fluids and to remain as active as possible.  Mucositis due to antineoplastic therapy Patient has trace mucositis lesions to the right side of her tongue only.  She is requesting a was given a refill of her Salagen.  She also has nystatin swish and spit to use at home as well.  Patient stated understanding  of all instructions; and was in agreement with this plan of care. The patient knows to call the clinic with any problems, questions or concerns.   This was a shared visit with Dr.  Julien Nordmann today.  Total time spent with patient was 25 minutes;  with greater than 75 percent of that time spent in face to face counseling regarding patient's symptoms,  and coordination of care and follow up.  Disclaimer:This dictation was prepared with Dragon/digital dictation along with [employer]. Any transcriptional errors that result from this process are unintentional.  Drue Second, NP 01/12/2015   ADDENDUM: Hematology/Oncology Attending: I had a face to face encounter with the patient. I recommended her care plan. This is a very pleasant 74 years old white female with relapsed multiple myeloma is currently undergoing systemic chemotherapy with Carfilzomib, Cytoxan and Decadron status post 6 cycles and she is currently undergoing treatment with cycle #7. The patient has been tolerating her treatment well except for fatigue secondary to anemia of chronic disease. She came to the clinic today for evaluation and discussion of her treatment options as the patient is requesting break from chemotherapy  for the next few months. Her last myeloma panel showed stable disease. I had a lengthy discussion with the patient today about her current condition. I recommended for her to complete the last 2 days of cycle #7 today and tomorrow as a scheduled. I will arrange for the patient to come back for follow-up visit in 3 months for reevaluation after repeating myeloma panel. For the insufficiency, the patient will continue her follow-up and close monitoring by her nephrologist. She was advised to call immediately if she has any concerning symptoms in the interval.  Disclaimer: This note was dictated with voice recognition software. Similar sounding words can inadvertently be transcribed and may be missed upon review. [name]., MD 01/12/2015

## 2015-01-12 NOTE — Assessment & Plan Note (Signed)
Patient presents to the [facility] today to receive cycle 7, day 15 of her Cytoxan/Kyprolis chemotherapy regimen.  She complains of some fatigue and trace mucositis only.  She denies any other new symptoms whatsoever.  She denies any recent fevers or chills.  She is requesting a refill of the Salagen for treatment of the mucositis.  Patient is requesting a three-month chemotherapy holiday to allow for full recovery.  This request was reviewed with Dr. Julien Nordmann; he was in agreement.  All future appointments will be canceled; and patient will be rescheduled for a follow-up visit around 04/11/2015.  Patient will require labs in approximately one month prior to her next follow-up visit.  Patient is to call in the interim if any new worries or concerns whatsoever.

## 2015-01-14 ENCOUNTER — Encounter: Payer: Self-pay | Admitting: *Deleted

## 2015-01-18 ENCOUNTER — Other Ambulatory Visit: Payer: Medicare Other

## 2015-01-24 ENCOUNTER — Ambulatory Visit: Payer: Medicare Other | Admitting: Internal Medicine

## 2015-01-24 ENCOUNTER — Ambulatory Visit: Payer: Medicare Other

## 2015-01-24 ENCOUNTER — Other Ambulatory Visit: Payer: Medicare Other

## 2015-01-25 ENCOUNTER — Emergency Department (HOSPITAL_COMMUNITY): Payer: Medicare Other

## 2015-01-25 ENCOUNTER — Ambulatory Visit: Payer: Medicare Other

## 2015-01-25 ENCOUNTER — Emergency Department (HOSPITAL_COMMUNITY)
Admission: EM | Admit: 2015-01-25 | Discharge: 2015-01-25 | Disposition: A | Discharge status: Home / Self Care | Payer: Medicare Other | Attending: Emergency Medicine | Admitting: Emergency Medicine

## 2015-01-25 ENCOUNTER — Encounter (HOSPITAL_COMMUNITY): Payer: Self-pay | Admitting: Emergency Medicine

## 2015-01-25 DIAGNOSIS — S82832A Other fracture of upper and lower end of left fibula, initial encounter for closed fracture: Secondary | ICD-10-CM | POA: Diagnosis not present

## 2015-01-25 DIAGNOSIS — F419 Anxiety disorder, unspecified: Secondary | ICD-10-CM | POA: Insufficient documentation

## 2015-01-25 DIAGNOSIS — Y9289 Other specified places as the place of occurrence of the external cause: Secondary | ICD-10-CM | POA: Diagnosis not present

## 2015-01-25 DIAGNOSIS — S8262XA Displaced fracture of lateral malleolus of left fibula, initial encounter for closed fracture: Secondary | ICD-10-CM | POA: Diagnosis not present

## 2015-01-25 DIAGNOSIS — F329 Major depressive disorder, single episode, unspecified: Secondary | ICD-10-CM | POA: Insufficient documentation

## 2015-01-25 DIAGNOSIS — M1990 Unspecified osteoarthritis, unspecified site: Secondary | ICD-10-CM | POA: Diagnosis not present

## 2015-01-25 DIAGNOSIS — N183 Chronic kidney disease, stage 3 (moderate): Secondary | ICD-10-CM | POA: Diagnosis not present

## 2015-01-25 DIAGNOSIS — I252 Old myocardial infarction: Secondary | ICD-10-CM | POA: Diagnosis not present

## 2015-01-25 DIAGNOSIS — K219 Gastro-esophageal reflux disease without esophagitis: Secondary | ICD-10-CM | POA: Diagnosis not present

## 2015-01-25 DIAGNOSIS — Z7952 Long term (current) use of systemic steroids: Secondary | ICD-10-CM | POA: Insufficient documentation

## 2015-01-25 DIAGNOSIS — J449 Chronic obstructive pulmonary disease, unspecified: Secondary | ICD-10-CM | POA: Insufficient documentation

## 2015-01-25 DIAGNOSIS — Z87891 Personal history of nicotine dependence: Secondary | ICD-10-CM | POA: Insufficient documentation

## 2015-01-25 DIAGNOSIS — Z853 Personal history of malignant neoplasm of breast: Secondary | ICD-10-CM | POA: Diagnosis not present

## 2015-01-25 DIAGNOSIS — Y998 Other external cause status: Secondary | ICD-10-CM | POA: Insufficient documentation

## 2015-01-25 DIAGNOSIS — Z91040 Latex allergy status: Secondary | ICD-10-CM | POA: Diagnosis not present

## 2015-01-25 DIAGNOSIS — G629 Polyneuropathy, unspecified: Secondary | ICD-10-CM | POA: Diagnosis not present

## 2015-01-25 DIAGNOSIS — D649 Anemia, unspecified: Secondary | ICD-10-CM | POA: Diagnosis not present

## 2015-01-25 DIAGNOSIS — Z8579 Personal history of other malignant neoplasms of lymphoid, hematopoietic and related tissues: Secondary | ICD-10-CM | POA: Diagnosis not present

## 2015-01-25 DIAGNOSIS — Y9389 Activity, other specified: Secondary | ICD-10-CM | POA: Diagnosis not present

## 2015-01-25 DIAGNOSIS — E039 Hypothyroidism, unspecified: Secondary | ICD-10-CM | POA: Diagnosis not present

## 2015-01-25 DIAGNOSIS — Z79899 Other long term (current) drug therapy: Secondary | ICD-10-CM | POA: Diagnosis not present

## 2015-01-25 DIAGNOSIS — E871 Hypo-osmolality and hyponatremia: Secondary | ICD-10-CM | POA: Insufficient documentation

## 2015-01-25 DIAGNOSIS — S82831A Other fracture of upper and lower end of right fibula, initial encounter for closed fracture: Secondary | ICD-10-CM | POA: Diagnosis not present

## 2015-01-25 DIAGNOSIS — S99912A Unspecified injury of left ankle, initial encounter: Secondary | ICD-10-CM | POA: Diagnosis present

## 2015-01-25 MED ORDER — HYDROCODONE-ACETAMINOPHEN 5-325 MG PO TABS
2.0000 | ORAL_TABLET | Freq: Once | ORAL | Status: AC
Start: 2015-01-25 — End: 2015-01-25
  Administered 2015-01-25: 2 via ORAL
  Filled 2015-01-25: qty 2

## 2015-01-25 MED ORDER — HYDROCODONE-ACETAMINOPHEN 5-325 MG PO TABS: 1.0000 | ORAL_TABLET | Freq: Four times a day (QID) | ORAL | Status: DC | PRN

## 2015-01-25 NOTE — ED Provider Notes (Signed)
History  By signing my name below, I, Nancy Norris, attest that this documentation has been prepared under the direction and in the presence of Nancy Norris, [place]. Electronically Signed: Marlowe Norris, ED Scribe. 01/25/2015. 5:22 PM.  Chief Complaint  Patient presents with  . Ankle Injury   The history is provided by the patient and medical records. No language interpreter was used.    HPI Comments:  Nancy Norris is a 75 y.o. female who presents to the Emergency Department complaining of worsening, severe, throbbing left ankle pain that began approximately two weeks ago. Pt states she was stepping up on a bus and twisted the left ankle, causing a popping sound. She reports some bruising and swelling of the left ankle. She has been taking Tramadol for pain with moderate relief of the pain. She states she started using a cane while walking with significant relief of the pain while walking. She states she was putting a sock on her left foot today and heard a popping sound again. Elevating the LLE increases her pain. She denies alleviating factors. She denies numbness, tingling or weakness of the left foot or ankle.  Past Medical History  Diagnosis Date  . Hyperkalemia   . Hypothyroidism   . COPD (chronic obstructive pulmonary disease) ([place])   . Hyponatremia   . Dizziness   . Fibromyalgia   . Breast cancer ([place])   . Multiple myeloma   . Mucositis   . Hx of echocardiogram     a.  Echocardiogram (12/26/2012): EF 14-43%, grade 1 diastolic dysfunction;   b.  Echocardiogram (02/2013): EF 55-60%, no WMA, trivial effusion  . Fibromyalgia   . Arthritis   . Hx of cardiovascular stress test     LexiScan with low level exercise Myoview (02/2013): No ischemia, EF 72%; normal study  . Complication of anesthesia   . PONV (postoperative nausea and vomiting) 2008    after mastestomy  . Myocardial infarction [facility])     in past, patient was unaware.   . Anginal pain ([place])     used NTG x 2 May  31 and 06/15/13   . GERD (gastroesophageal reflux disease)   . Headache(784.0)   . Anxiety   . Depression   . CKD (chronic kidney disease) stage 3, GFR 30-59 ml/min   . Anemia   . History of blood transfusion     last one May 12   . Neuropathy ([place])   . B12 deficiency 12/04/2014   Past Surgical History  Procedure Laterality Date  . History of port removal    . Status post stem cell transplant on September 28, 2008.    Marland Kitchen Abdominal hysterectomy  1981  . Cholecystectomy  1971  . Mastectomy Left 2008  . Cataract extraction, bilateral    . Breast surgery    . Eye surgery Bilateral     lens implant  . Breast reconstruction    . Portacath placement  12/2012    has had 2  . Av fistula placement Left 06/19/2013    Procedure: CREATION OF LEFT ARM ARTERIOVENOUS (AV) FISTULA ;  Surgeon: Angelia Mould, MD;  Location: [facility] OR;  Service: Vascular;  Laterality: Left;   Family History  Problem Relation Age of Onset  . Arthritis Mother   . Asthma Mother   . Cancer Sister   . Hyperlipidemia Brother    Social History  Substance Use Topics  . Smoking status: Former Smoker -- 1.00 packs/day for 30 years  Types: Cigarettes    Quit date: 02/15/2005  . Smokeless tobacco: Never Used  . Alcohol Use: No   OB History    No data available     Review of Systems  Musculoskeletal: Positive for joint swelling and arthralgias.  Skin: Positive for color change. Negative for wound.  Neurological: Negative for weakness and numbness.    Allergies  Codeine; Latex; Other; Onion; Zyprexa; Adhesive; Iodinated diagnostic agents; and Sulfa antibiotics  Home Medications   Prior to Admission medications   Medication Sig Start Date End Date Taking? Authorizing Provider  acetaminophen (TYLENOL) 325 MG tablet Take 650 mg by mouth every 6 (six) hours as needed for mild pain or moderate pain.     Historical Provider, MD  albuterol (PROVENTIL HFA;VENTOLIN HFA) 108 (90 BASE) MCG/ACT inhaler Inhale 2  puffs into the lungs 2 (two) times daily as needed for wheezing or shortness of breath. 06/04/13   [name], MD  ascorbic acid (VITAMIN C) 250 MG CHEW Chew 250 mg by mouth daily.    Historical Provider, MD  benzonatate (TESSALON) 200 MG capsule Take 1 capsule (200 mg total) by mouth 3 (three) times daily as needed for cough. 01/03/15   Deneise Lever, MD  buPROPion (WELLBUTRIN XL) 300 MG 24 hr tablet Take 300 mg by mouth daily.  11/03/13   Historical Provider, MD  carboxymethylcellulose (REFRESH PLUS) 0.5 % SOLN Place 1 drop into both eyes every 2 (two) hours.    Historical Provider, MD  Cholecalciferol 4000 UNITS TABS Take 4,000 Units by mouth daily.    Historical Provider, MD  citalopram (CELEXA) 40 MG tablet Take 40 mg by mouth daily.    Historical Provider, MD  cromolyn (OPTICROM) 4 % ophthalmic solution Place 1 drop into both eyes 4 (four) times daily.    Historical Provider, MD  dexamethasone (DECADRON) 4 MG tablet Take 4 mg by mouth once a week.    Historical Provider, MD  esomeprazole (NEXIUM) 40 MG capsule TAKE 1 CAPSULE BY MOUTH DAILY, EACH MORNING FOR ACID INDIGESTION 03/26/14   [name], MD  Ferrous Sulfate 134 MG TABS Take 1 tablet (134 mg total) by mouth every other day. 06/09/14   [name], MD  gabapentin (NEURONTIN) 300 MG capsule TAKE 1 CAPSULE BY MOUTH AT BEDTIME. 07/21/14   Courtney Forcucci, PA-C  hydrOXYzine (ATARAX/VISTARIL) 25 MG tablet Take 1 tablet by mouth 3 (three) times daily as needed for vomiting. Reported on 01/11/2015 11/09/14   Historical Provider, MD  isosorbide mononitrate (IMDUR) 30 MG 24 hr tablet TAKE 1 TABLET BY MOUTH DAILY. 02/21/14   Josue Hector, MD  levothyroxine (SYNTHROID, LEVOTHROID) 150 MCG tablet TAKE 1 TABLET BY MOUTH ONCE DAILY. 12/20/14   Courtney Forcucci, PA-C  loperamide (IMODIUM) 2 MG capsule Take two tabs po initially, then one tab after each loose stool: max 8 tabs in 24 hours 08/13/14   Courtney Forcucci, PA-C  loratadine  (CLARITIN) 10 MG tablet Take 10 mg by mouth daily.    Historical Provider, MD  LORazepam (ATIVAN) 0.5 MG tablet Take 1 tablet (0.5 mg total) by mouth every 8 (eight) hours as needed (nausea). 08/18/14   Curt Bears, MD  loteprednol (ALREX) 0.2 % SUSP Place 1 drop into both eyes 4 (four) times daily. 12/08/13   Vicie Mutters, PA-C  meclizine (ANTIVERT) 32 MG tablet Take 32 mg by mouth 3 (three) times daily as needed for dizziness.    Historical Provider, MD  midodrine (PROAMATINE) 10 MG tablet Take 1  tablet (10 mg total) by mouth 3 (three) times daily. 04/01/14   Josue Hector, MD  nitroGLYCERIN (NITROSTAT) 0.4 MG SL tablet Place 0.4 mg under the tongue every 5 (five) minutes as needed for chest pain.    Historical Provider, MD  ondansetron (ZOFRAN-ODT) 8 MG disintegrating tablet Take 1 tablet (8 mg total) by mouth every 8 (eight) hours as needed for nausea or vomiting. 05/01/14   Adrena E Johnson, PA-C  pilocarpine (SALAGEN) 5 MG tablet TAKE 1 TABLET BY MOUTH 3 TIMES DAILY. 01/11/15   Susanne Borders, NP  prochlorperazine (COMPAZINE) 10 MG tablet Take 10 mg by mouth every 6 (six) hours as needed for nausea or vomiting.    Historical Provider, MD  ranitidine (ZANTAC) 300 MG tablet TAKE 1 TABLET BY MOUTH AT BEDTIME, FOR ACID REFLUX AND INDIGESTION. 03/26/14   [name], MD  RESTASIS 0.05 % ophthalmic emulsion Place 1 drop into both eyes 2 (two) times daily.  11/13/13   Historical Provider, MD  senna (SENOKOT) 8.6 MG tablet Take 1 tablet by mouth at bedtime.    Historical Provider, MD  sodium bicarbonate 650 MG tablet Take 1 tablet (650 mg total) by mouth 2 (two) times daily. 12/07/14   Eugenie Filler, MD  Umeclidinium-Vilanterol (ANORO ELLIPTA) 62.5-25 MCG/INH AEPB Inhale 1 puff into the lungs daily.    Historical Provider, MD   Triage Vitals: BP 103/56 mmHg  Pulse 70  Temp(Src) 97.8 F (36.6 C) (Oral)  Resp 18  SpO2 100% Physical Exam Physical Exam  Constitutional: Pt appears  well-developed and well-nourished. No distress.  HENT:  Head: Normocephalic and atraumatic.  Eyes: Conjunctivae are normal.  Neck: Normal range of motion.  Cardiovascular: Normal rate, regular rhythm and intact distal pulses.   Capillary refill < 3 sec  Pulmonary/Chest: Effort normal and breath sounds normal.  Musculoskeletal: Pt exhibits tenderness at the distal fibula. No bony abnormality or deformity. Pt exhibits no edema.  ROM: 5/5  Neurological: Pt  is alert. Coordination normal.  Sensation 5/5 Strength 5/5  Skin: Skin is warm and dry. Pt is not diaphoretic.  No tenting of the skin  Psychiatric: Pt has a normal mood and affect.  Nursing note and vitals reviewed.   ED Course  Procedures (including critical care time) DIAGNOSTIC STUDIES: Oxygen Saturation is 100% on RA, normal by my interpretation.   COORDINATION OF CARE: 5:19 PM- Will order cam walker and refer to orthopedist. Will prescribe Vicodin and give a dose prior to discharge. Pt verbalizes understanding and agrees to plan.  Medications - No data to display  Imaging Review Dg Ankle Complete Left  01/25/2015  CLINICAL DATA:  Trip getting onto but with twisted left ankle 01/12/2015. Initial encounter. EXAM: LEFT ANKLE COMPLETE - 3+ VIEW COMPARISON:  None. FINDINGS: Coronal oblique fracture through the distal fibula, with lower margin at the level of the ankle joint. No displacement. On frontal imaging the medial clear space is widened. There is age indeterminate cortical irregularity at the medial malleolus. IMPRESSION: Nondisplaced distal fibula fracture. Medial clear space widening suggesting ligamentous injury. Electronically Signed   By: Monte Fantasia M.D.   On: 01/25/2015 17:11   I have personally reviewed and evaluated these images and lab results as part of my medical decision-making.   MDM   Final diagnoses:  Fracture of distal end of left fibula    Patient with left ankle pain.  Rolled ankle a couple of  weeks ago, has had persistent pain.  Plain films  consistent with distal fib fracture.  Cam walker and pain meds.  Recommend ortho follow-up.  Shared visit with Dr. Oleta Mouse.  I personally performed the services described in this documentation, which was scribed in my presence. The recorded information has been reviewed and is accurate.       Montine Circle, PA-C 01/25/15 [place] Liu, MD 01/26/15 [phone]

## 2015-01-25 NOTE — ED Notes (Signed)
Pt states she tripped and fell getting onto bus and twisted left ankle, felt a pop. States she has rested it since, over the weekend she went to put on socks and felt a second pop that made it hurt a lot worse. Has had a difficult time getting out of bed since accident d/t pain. Moderate swelling to left ankle visible in triage.

## 2015-01-25 NOTE — Discharge Instructions (Signed)
Fibular Fracture With Rehab The fibula is the smaller of the two lower leg bones and is vulnerable to breaks (fracture). Fibular fractures may go fully through the bone (complete) or partially (incomplete). The bone fragments are rarely out of alignment (displaced fracture). Fibula fractures may occur anywhere along the bone. However, this document only discusses fractures that do not involve a leg joint. Fibular fractures are not often a severe injury because the bone supports only about 17% of the body weight. SYMPTOMS   Moderate to severe pain in the lower leg.  Tenderness and swelling in the leg or calf.  Bleeding and/or bruising (contusion) in the leg.  Inability to bear weight on the injured extremity.  Visible deformity, if the fracture is displaced.  Numbness and coldness in the leg and foot, beyond the fracture site, if blood supply is impaired. CAUSES  Fractures occur when a force is placed on the bone that is greater than it can withstand. Common causes of fibular fracture include:  Direct hit (trauma) (i.e., hockey or lacrosse check to the lower leg).  Stress fracture (weakening of the bone from repeated stress).  Indirect injury, caused by twisting, turning quickly, or violent muscle contraction. RISK INCREASES WITH:  Contact sports (i.e., football, soccer, lacrosse, hockey).  Sports that can cause twisted ankle injury (i.e., skiing, basketball).  Bony abnormalities (i.e., osteoporosis or bone tumors).  Metabolism disorders, hormone problems, and nutrition deficiency and disorders (i.e., anorexia and bulimia).  Poor strength and flexibility. PREVENTION   Warm up and stretch properly before activity.  Maintain physical fitness:  Strength, flexibility, and endurance.  Cardiovascular fitness.  Wear properly fitted and padded protective equipment (i.e., shin guards for soccer). PROGNOSIS  If treated properly, fibular fractures usually heal in 4 to 6 weeks.    RELATED COMPLICATIONS   Failure of bone to heal (nonunion).  Bone heals in a poor position (malunion).  Increased pressure inside the leg (compartment syndrome) due to injury that disrupts the blood supply to the leg and foot and injures the nerves and muscles (uncommon).  Shortening of the injured bones.  Hindrance of normal bone growth in children.  Risks of surgery: infection, bleeding, injury to nerves (numbness, weakness, paralysis), need for further surgery.  Longer healing time if activity is resumed too soon. TREATMENT Treatment first involves ice, medicine, and elevation of the leg to reduce pain and inflammation. People with fibular fractures are advised to walk using crutches. A brace or walking boot may be given to restrain the injured leg and allow for healing. Sometimes, surgery is needed to place a rod, plate, or screws in the bones in order to fix the fracture. After surgery, the leg is restrained. After restraint (with or without surgery), it is important to complete strengthening and stretching exercises to regain strength and a full range of motion. Exercises may be completed at home or with a therapist. MEDICATION   If pain medicine is needed, nonsteroidal anti-inflammatory medicines (aspirin and ibuprofen), or other minor pain relievers (acetaminophen), are often advised.  Do not take pain medicine for 7 days before surgery.  Prescription pain relievers may be given if your health care provider thinks they are needed. Use only as directed and only as much as you need. SEEK MEDICAL CARE IF:  Symptoms get worse or do not improve in 2 weeks, despite treatment.  The following occur after restraint or surgery. (Report any of these signs immediately):  Swelling above or below the fracture site.  Severe, persistent pain.  Blue or gray skin below the fracture site, especially under the toenails. Numbness or loss of feeling below the fracture site.  New, unexplained  symptoms develop. (Drugs used in treatment may produce side effects.) EXERCISES  RANGE OF MOTION (ROM) AND STRETCHING EXERCISES - Fibular Fracture These exercises may help you when beginning to recover from your injury. Your symptoms may go away with or without further involvement from your physician, physical therapist or athletic trainer. While completing these exercises, remember:   Restoring tissue flexibility helps normal motion to return to the joints. This allows healthier, less painful movement and activity.  An effective stretch should be held for at least 30 seconds.  A stretch should never be painful. You should only feel a gentle lengthening or release in the stretched tissue. RANGE OF MOTION - Dorsi/Plantar Flexion  While sitting with your right / left knee straight, draw the top of your foot upwards by flexing your ankle. Then reverse the motion, pointing your toes downward.  Hold each position for __________ seconds.  After completing your first set of exercises, repeat this exercise with your knee bent. Repeat __________ times. Complete this exercise __________ times per day.  STRETCH - Gastrocsoleus   Sit with your right / left leg extended. Holding onto both ends of a belt or towel, loop it around the ball of your foot.  Keeping your right / left ankle and foot relaxed and your knee straight, pull your foot and ankle toward you using the belt.  You should feel a gentle stretch behind your calf or knee. Hold this position for __________ seconds. Repeat __________ times. Complete this stretch __________ times per day.  RANGE OF MOTION- Ankle Plantar Flexion   Sit with your right / left leg crossed over your opposite knee.  Use your opposite hand to pull the top of your foot and toes toward you.  You should feel a gentle stretch on the top of your foot and ankle. Hold this position for __________ seconds. Repeat __________ times. Complete __________ times per day.    RANGE OF MOTION - Ankle Eversion  Sit with your right / left ankle crossed over your opposite knee.  Grip your foot with your opposite hand, placing your thumb on the top of your foot and your fingers across the bottom of your foot.  Gently push your foot downward with a slight rotation so your littlest toes rise slightly toward the ceiling.  You should feel a gentle stretch on the inside of your ankle. Hold the stretch for __________ seconds. Repeat __________ times. Complete this exercise __________ times per day.  RANGE OF MOTION - Ankle Inversion  Sit with your right / left ankle crossed over your opposite knee.  Grip your foot with your opposite hand, placing your thumb on the bottom of your foot and your fingers across the top of your foot.  Gently pull your foot so the smallest toe comes toward you and your thumb pushes the inside of the ball of your foot away from you.  You should feel a gentle stretch on the outside of your ankle. Hold the stretch for __________ seconds. Repeat __________ times. Complete this exercise __________ times per day.  RANGE OF MOTION - Ankle Alphabet  Imagine your right / left big toe is a pen.  Keeping your hip and knee still, write out the entire alphabet with your "pen." Make the letters as large as you can, without increasing any discomfort. Repeat __________ times. Complete this exercise  __________ times per day.  RANGE OF MOTION - Ankle Dorsiflexion, Active Assisted  Remove your shoes and sit on a chair, preferably not on a carpeted surface.  Place your right / left foot on the floor, directly under your knee. Extend your opposite leg for support.  Keeping your heel down, slide your right / left foot back toward the chair, until you feel a stretch at your ankle or calf. If you do not feel a stretch, slide your bottom forward to the edge of the chair, while still keeping your heel down.  Hold this stretch for __________ seconds. Repeat  __________ times. Complete this stretch __________ times per day.  STRENGTHENING EXERCISES - Fibular Fracture These exercises may help you when beginning to recover from your injury. They may resolve your symptoms with or without further involvement from your physician, physical therapist or athletic trainer. While completing these exercises, remember:   Muscles can gain both the endurance and the strength needed for everyday activities through controlled exercises.  Complete these exercises as instructed by your physician, physical therapist or athletic trainer. Increase the resistance and repetitions only as guided.  You may experience muscle soreness or fatigue, but the pain or discomfort you are trying to eliminate should never worsen during these exercises. If this pain does get worse, stop and make certain you are following the directions exactly. If the pain is still present after adjustments, discontinue the exercise until you can discuss the trouble with your clinician. STRENGTH - Dorsiflexors  Secure a rubber exercise band or tubing to a fixed object (table, pole) and loop the other end around your right / left foot.  Sit on the floor, facing the fixed object. The band should be slightly tense when your foot is relaxed.  Slowly draw your foot back toward you, using your ankle and toes.  Hold this position for __________ seconds. Slowly release the tension in the band and return your foot to the starting position. Repeat __________ times. Complete this exercise __________ times per day.  STRENGTH - Plantar-flexors  Sit with your right / left leg extended. Holding onto both ends of a rubber exercise band or tubing, loop it around the ball of your foot. Keep a slight tension in the band.  Slowly push your toes away from you, pointing them downward.  Hold this position for __________ seconds. Return to the starting position slowly, controlling the tension in the band. Repeat  __________ times. Complete this exercise __________ times per day.  STRENGTH - Plantar-flexors, Standing   Stand with your feet shoulder width apart. Place your hands on a wall or table to steady yourself, using as little support as needed.  Keeping your weight evenly spread over the width of your feet, rise up on your toes.*  Hold this position for __________ seconds. Repeat __________ times. Complete this exercise __________ times per day.  *If this is too easy, shift your weight toward your right / left leg until you feel challenged. Ultimately, you may be asked to do this exercise while standing on your right / left foot only. STRENGTH - Towel Curls  Sit in a chair, on a non-carpeted surface.  Place your foot on a towel, keeping your heel on the floor.  Pull the towel toward your heel only by curling your toes. Keep your heel on the floor.  If instructed by your physician, physical therapist or athletic trainer, add ____________________ at the end of the towel. Repeat __________ times. Complete this exercise __________  times per day. STRENGTH - Ankle Eversion  Secure one end of a rubber exercise band or tubing to a fixed object (table, pole). Loop the other end around your foot, just before your toes.  Place your fists between your knees. This will focus your strengthening at your ankle.  Drawing the band across your opposite foot, away from the pole, slowly pull your little toe out and up. Make sure the band is positioned to resist the entire motion.  Hold this position for __________ seconds.  Return to the starting position slowly, controlling the tension in the band. Repeat __________ times. Complete this exercise __________ times per day.  STRENGTH - Ankle Inversion  Secure one end of a rubber exercise band or tubing to a fixed object (table, pole). Loop the other end around your foot, just before your toes.  Place your fists between your knees. This will focus your  strengthening at your ankle.  Slowly, pull your big toe up and in, making sure the band is positioned to resist the entire motion.  Hold this position for __________ seconds.  Return to the starting position slowly, controlling the tension in the band. Repeat __________ times. Complete this exercises __________ times per day.    This information is not intended to replace advice given to you by your health care provider. Make sure you discuss any questions you have with your health care provider.   Document Released: 01/01/2005 Document Revised: 01/22/2014 Document Reviewed: 04/15/2008 Elsevier Interactive Patient Education [employer].

## 2015-01-31 ENCOUNTER — Ambulatory Visit: Payer: Medicare Other

## 2015-01-31 ENCOUNTER — Other Ambulatory Visit: Payer: Medicare Other

## 2015-02-01 ENCOUNTER — Ambulatory Visit: Payer: Medicare Other

## 2015-02-02 ENCOUNTER — Telehealth: Payer: Self-pay | Admitting: *Deleted

## 2015-02-02 NOTE — Telephone Encounter (Signed)
"  I need Retta Mac NP to make a referral to [facility].  I have a rash all over my body for the past week.  I itch everywhere.  It feels pebbly.  I can't see the areas but have scratched and  Areas are red from scratching.  I broke my ankle, went to ED and received hydrocodone 5-325 mg. I do not know if I'm allergic to it or what.  They won't see me without a referral"  Return number [phone].  Followed by Dr. Julien Nordmann and will notify him as well of this request.

## 2015-02-02 NOTE — Telephone Encounter (Signed)
She probably would need the referral from her primary care physician.

## 2015-02-03 ENCOUNTER — Ambulatory Visit (INDEPENDENT_AMBULATORY_CARE_PROVIDER_SITE_OTHER): Payer: Medicare Other | Admitting: Internal Medicine

## 2015-02-03 ENCOUNTER — Encounter: Payer: Self-pay | Admitting: Internal Medicine

## 2015-02-03 VITALS — BP 122/60 | HR 72 | Temp 98.0°F | Resp 18 | Ht 65.0 in

## 2015-02-03 DIAGNOSIS — L270 Generalized skin eruption due to drugs and medicaments taken internally: Secondary | ICD-10-CM | POA: Diagnosis not present

## 2015-02-03 MED ORDER — PREDNISONE 20 MG PO TABS: ORAL_TABLET | ORAL | Status: DC

## 2015-02-03 MED ORDER — TRIAMCINOLONE ACETONIDE 0.1 % EX CREA: 1.0000 "application " | TOPICAL_CREAM | Freq: Four times a day (QID) | CUTANEOUS | Status: DC | PRN

## 2015-02-03 MED ORDER — DEXAMETHASONE SODIUM PHOSPHATE 100 MG/10ML IJ SOLN
10.0000 mg | Freq: Once | INTRAMUSCULAR | Status: AC
  Administered 2015-02-03: 10 mg via INTRAMUSCULAR

## 2015-02-03 MED ORDER — MECLIZINE HCL 32 MG PO TABS: 32.0000 mg | ORAL_TABLET | Freq: Three times a day (TID) | ORAL | Status: DC | PRN

## 2015-02-03 NOTE — Progress Notes (Signed)
   Subjective:    Patient ID: Nancy Norris, female    DOB: [birth year], 75 y.o.   MRN: [id number]  HPI  Patient presents to the office for evaluation of itching and rash which started at the same time that she started taking hydrocodone which she was given on 01/24/14 for distal fibular fracture.  She reports that she has had a fine bumpy rash which is red when she gets hot.  She has been taking benadryl at home which isn't helping much.  She has been doing alcohol on her skin.    Review of Systems  Constitutional: Negative for fever, chills and fatigue.  Respiratory: Negative for chest tightness and shortness of breath.   Cardiovascular: Negative for chest pain and palpitations.  Skin: Positive for rash. Negative for color change, pallor and wound.       Objective:   Physical Exam  Constitutional: She is oriented to person, place, and time. She appears well-developed and well-nourished. No distress.  HENT:  Head: Normocephalic.  Mouth/Throat: Oropharynx is clear and moist. No oropharyngeal exudate.  Eyes: Conjunctivae are normal. No scleral icterus.  Neck: Normal range of motion. Neck supple. No JVD present. No thyromegaly present.  Cardiovascular: Normal rate, regular rhythm, normal heart sounds and intact distal pulses.  Exam reveals no gallop and no friction rub.   No murmur heard. Pulmonary/Chest: Breath sounds normal. No respiratory distress. She has no wheezes. She has no rales. She exhibits no tenderness.  Abdominal: Soft. Bowel sounds are normal. She exhibits no distension and no mass. There is no tenderness. There is no rebound and no guarding.  Musculoskeletal: Normal range of motion.  Lymphadenopathy:    She has no cervical adenopathy.  Neurological: She is alert and oriented to person, place, and time.  Skin: Skin is warm and dry. Rash noted. She is not diaphoretic.  Generalized excoriated rash over the trunk, extremities, and inner thighs.  No visible rash noted at this  time.  There is scabbing in koebner phenomenon patterns along the back and abdomen.  Psychiatric: She has a normal mood and affect. Her behavior is normal. Judgment and thought content normal.  Nursing note and vitals reviewed.   Filed Vitals:   02/03/15 1543  BP: 122/60  Pulse: 72  Temp: 98 F (36.7 C)  Resp: 18         Assessment & Plan:    1. Eruption due to drug -prednisone -claritin,zyrtec, or allegra daily -benadryl as needed at night time -stop hydrocodone -caladryl cream -kenalog cream - dexamethasone (DECADRON) injection 10 mg; Inject 1 mL (10 mg total) into the muscle once.  Hydrocodone added to allergy list.

## 2015-02-03 NOTE — Telephone Encounter (Signed)
Called patient with this information.

## 2015-02-03 NOTE — Patient Instructions (Addendum)
Please use cool compresses or ice packs on your skin as often as needed for itching.  Please take benadryl every 6 hours as needed for itching. Please don't take meclizine or antivert at the same time.  Please take zantac twice daily instead of just at night time.  Use kenalog cream on rash up to 4 times per day.    You can use caladryl in between.  Please finish the prednisone.     Drug Rash A drug rash is a change in the color or texture of the skin that is caused by a drug. It can develop minutes, hours, or days after the person takes the drug. CAUSES This condition is usually caused by a drug allergy. It can also be caused by exposure to sunlight after taking a drug that makes the skin sensitive to light. Drugs that commonly cause rashes include:  Penicillin.  Antibiotic medicines.  Medicines that treat seizures.  Medicines that treat cancer (chemotherapy).  Aspirin and other nonsteroidal anti-inflammatory drugs (NSAIDs).  Injectable dyes that contain iodine.  Insulin. SYMPTOMS Symptoms of this condition include:  Redness.  Tiny bumps.  Peeling.  Itching.  Itchy welts (hives).  Swelling. The rash may appear on a small area of skin or all over the body. DIAGNOSIS To diagnose the condition, your health care provider will do a physical exam. He or she may also order tests to find out which drug caused the rash. Tests to find the cause of a rash include:  Skin tests.  Blood tests.  Drug challenge. For this test, you stop taking all of the drugs that you do not need to take, and then you start taking them again by adding back one of the drugs at a time. TREATMENT A drug rash may be treated with medicines, including:  Antihistamines. These may be given to relieve itching.  An NSAID. This may be given to reduce swelling and treat pain.  A steroid drug. This may be given to reduce swelling. The rash usually goes away when the person stops taking the drug that  caused it. HOME CARE INSTRUCTIONS  Take medicines only as directed by your health care provider.  Let all of your health care providers know about any drug reactions you have had in the past.  If you have hives, take a cool shower or use a cool compress to relieve itchiness. SEEK MEDICAL CARE IF:  You have a fever.  Your rash is not going away.  Your rash gets worse.  Your rash comes back.  You have wheezing or coughing. SEEK IMMEDIATE MEDICAL CARE IF:  You start to have breathing problems.  You start to have shortness of breath.  You face or throat starts to swell.  You have severe weakness with dizziness or fainting.  You have chest pain.   This information is not intended to replace advice given to you by your health care provider. Make sure you discuss any questions you have with your health care provider.   Document Released: 02/09/2004 Document Revised: 01/22/2014 Document Reviewed: 10/28/2013 Elsevier Interactive Patient Education [employer].

## 2015-02-07 ENCOUNTER — Ambulatory Visit: Payer: Medicare Other

## 2015-02-07 ENCOUNTER — Other Ambulatory Visit: Payer: Medicare Other

## 2015-02-07 ENCOUNTER — Other Ambulatory Visit: Payer: Self-pay | Admitting: Internal Medicine

## 2015-02-08 ENCOUNTER — Ambulatory Visit: Payer: Medicare Other

## 2015-02-10 DIAGNOSIS — N184 Chronic kidney disease, stage 4 (severe): Secondary | ICD-10-CM | POA: Diagnosis not present

## 2015-02-10 DIAGNOSIS — D638 Anemia in other chronic diseases classified elsewhere: Secondary | ICD-10-CM | POA: Diagnosis not present

## 2015-02-10 DIAGNOSIS — S82832D Other fracture of upper and lower end of left fibula, subsequent encounter for closed fracture with routine healing: Secondary | ICD-10-CM | POA: Diagnosis not present

## 2015-02-10 DIAGNOSIS — S82832A Other fracture of upper and lower end of left fibula, initial encounter for closed fracture: Secondary | ICD-10-CM | POA: Diagnosis not present

## 2015-02-10 DIAGNOSIS — S92355D Nondisplaced fracture of fifth metatarsal bone, left foot, subsequent encounter for fracture with routine healing: Secondary | ICD-10-CM | POA: Diagnosis not present

## 2015-02-10 DIAGNOSIS — N2581 Secondary hyperparathyroidism of renal origin: Secondary | ICD-10-CM | POA: Diagnosis not present

## 2015-02-10 DIAGNOSIS — D630 Anemia in neoplastic disease: Secondary | ICD-10-CM | POA: Diagnosis not present

## 2015-02-10 DIAGNOSIS — S92355A Nondisplaced fracture of fifth metatarsal bone, left foot, initial encounter for closed fracture: Secondary | ICD-10-CM | POA: Diagnosis not present

## 2015-02-14 ENCOUNTER — Other Ambulatory Visit: Payer: Medicare Other

## 2015-02-14 ENCOUNTER — Other Ambulatory Visit: Payer: Self-pay | Admitting: Internal Medicine

## 2015-02-14 DIAGNOSIS — K219 Gastro-esophageal reflux disease without esophagitis: Secondary | ICD-10-CM

## 2015-02-14 MED ORDER — PANTOPRAZOLE SODIUM 40 MG PO TBEC: DELAYED_RELEASE_TABLET | ORAL | Status: DC

## 2015-02-16 DIAGNOSIS — I5030 Unspecified diastolic (congestive) heart failure: Secondary | ICD-10-CM | POA: Diagnosis not present

## 2015-02-16 DIAGNOSIS — D630 Anemia in neoplastic disease: Secondary | ICD-10-CM | POA: Diagnosis not present

## 2015-02-16 DIAGNOSIS — N184 Chronic kidney disease, stage 4 (severe): Secondary | ICD-10-CM | POA: Diagnosis not present

## 2015-02-16 DIAGNOSIS — C9000 Multiple myeloma not having achieved remission: Secondary | ICD-10-CM | POA: Diagnosis not present

## 2015-02-16 DIAGNOSIS — N2581 Secondary hyperparathyroidism of renal origin: Secondary | ICD-10-CM | POA: Diagnosis not present

## 2015-02-16 DIAGNOSIS — R079 Chest pain, unspecified: Secondary | ICD-10-CM | POA: Diagnosis not present

## 2015-02-16 DIAGNOSIS — R809 Proteinuria, unspecified: Secondary | ICD-10-CM | POA: Diagnosis not present

## 2015-02-16 DIAGNOSIS — I951 Orthostatic hypotension: Secondary | ICD-10-CM | POA: Diagnosis not present

## 2015-02-16 DIAGNOSIS — I770 Arteriovenous fistula, acquired: Secondary | ICD-10-CM | POA: Diagnosis not present

## 2015-02-21 DIAGNOSIS — S92355A Nondisplaced fracture of fifth metatarsal bone, left foot, initial encounter for closed fracture: Secondary | ICD-10-CM | POA: Diagnosis not present

## 2015-02-21 DIAGNOSIS — S92355D Nondisplaced fracture of fifth metatarsal bone, left foot, subsequent encounter for fracture with routine healing: Secondary | ICD-10-CM | POA: Diagnosis not present

## 2015-02-21 DIAGNOSIS — S82832D Other fracture of upper and lower end of left fibula, subsequent encounter for closed fracture with routine healing: Secondary | ICD-10-CM | POA: Diagnosis not present

## 2015-02-21 DIAGNOSIS — S82832A Other fracture of upper and lower end of left fibula, initial encounter for closed fracture: Secondary | ICD-10-CM | POA: Diagnosis not present

## 2015-02-24 DIAGNOSIS — M25572 Pain in left ankle and joints of left foot: Secondary | ICD-10-CM | POA: Diagnosis not present

## 2015-02-24 DIAGNOSIS — S92355D Nondisplaced fracture of fifth metatarsal bone, left foot, subsequent encounter for fracture with routine healing: Secondary | ICD-10-CM | POA: Diagnosis not present

## 2015-02-24 DIAGNOSIS — R262 Difficulty in walking, not elsewhere classified: Secondary | ICD-10-CM | POA: Diagnosis not present

## 2015-02-24 DIAGNOSIS — M25672 Stiffness of left ankle, not elsewhere classified: Secondary | ICD-10-CM | POA: Diagnosis not present

## 2015-02-25 ENCOUNTER — Encounter: Payer: Self-pay | Admitting: Internal Medicine

## 2015-02-25 ENCOUNTER — Ambulatory Visit (INDEPENDENT_AMBULATORY_CARE_PROVIDER_SITE_OTHER): Payer: Medicare Other | Admitting: Internal Medicine

## 2015-02-25 VITALS — BP 126/64 | HR 72 | Temp 98.0°F | Resp 18 | Ht 65.0 in | Wt 156.0 lb

## 2015-02-25 DIAGNOSIS — F32A Depression, unspecified: Secondary | ICD-10-CM

## 2015-02-25 DIAGNOSIS — J309 Allergic rhinitis, unspecified: Secondary | ICD-10-CM

## 2015-02-25 DIAGNOSIS — F329 Major depressive disorder, single episode, unspecified: Secondary | ICD-10-CM

## 2015-02-25 MED ORDER — FLUTICASONE PROPIONATE 50 MCG/ACT NA SUSP: 2.0000 | Freq: Every day | NASAL | Status: DC

## 2015-02-25 MED ORDER — AZELASTINE HCL 0.1 % NA SOLN: 2.0000 | Freq: Two times a day (BID) | NASAL | Status: DC

## 2015-02-25 MED ORDER — ALBUTEROL SULFATE HFA 108 (90 BASE) MCG/ACT IN AERS: 1.0000 | INHALATION_SPRAY | Freq: Four times a day (QID) | RESPIRATORY_TRACT | Status: DC | PRN

## 2015-02-25 MED ORDER — UMECLIDINIUM-VILANTEROL 62.5-25 MCG/INH IN AEPB: 1.0000 | INHALATION_SPRAY | Freq: Every day | RESPIRATORY_TRACT | Status: DC

## 2015-02-25 MED ORDER — DEXAMETHASONE SODIUM PHOSPHATE 100 MG/10ML IJ SOLN
10.0000 mg | Freq: Once | INTRAMUSCULAR | Status: AC
  Administered 2015-02-25: 10 mg via INTRAMUSCULAR

## 2015-02-25 NOTE — Patient Instructions (Signed)
Take claritin without the D daily.  Please use astelin 2 sprays per nostril and use twice per day.  Please use flonase 2 sprays per nostril at bedtime.  Call me in 3-5 days if the decadron injection is not giving you any relief.  Katrina will call you about seeing Dr. Toy Care.    Use vaseline on your skin twice daily to help with the itching.

## 2015-02-25 NOTE — Progress Notes (Signed)
Patient ID: Nancy Norris, female   DOB: 1940/04/05, 75 y.o.   MRN: [id number]  HPI  Patient presents to the office for evaluation of cough and congestion.  It has been going on for 1 weeks.  Patient reports night > day, dry, barky, worse with lying down.  They also endorse change in voice, postnasal drip, shortness of breath, wheezing and nasal congestion, sinus pressure, scratchy throat.  .  They have tried antitussives or antihistamines.  They report that nothing has worked.  They denies other sick contacts.  She has been taking claritin D and has also been taking tessalon perles.    March 21st she is having carotid duplex.  She reports that Dr. [name] is the ordering doctor for this.    She reports that she feels like her depression is getting a lot worse.  She is irritated a lot.  She doesn't want to leave her apartment.  She doesn't want to take her existing pills.    Review of Systems  Constitutional: Negative for fever, chills and malaise/fatigue.  HENT: Positive for congestion. Negative for ear pain and sore throat.   Eyes: Positive for redness.  Respiratory: Positive for cough. Negative for sputum production, shortness of breath and wheezing.   Cardiovascular: Negative for chest pain, palpitations and leg swelling.  Skin: Positive for itching. Negative for rash.  Neurological: Negative for headaches.  Psychiatric/Behavioral: Positive for depression. Negative for substance abuse. The patient is nervous/anxious and has insomnia.     PE:  General:  Alert and non-toxic, WDWN, NAD HEENT: NCAT, PERLA, EOM normal, no occular discharge or erythema.  Nasal mucosal edema with sinus tenderness to palpation.  Oropharynx clear with minimal oropharyngeal edema and erythema.  Mucous membranes moist and pink. Neck:  Cervical adenopathy Chest:  RRR no MRGs.  Lungs clear to auscultation A&P with no wheezes rhonchi or rales.   Abdomen: +BS x 4 quadrants, soft, non-tender, no guarding, rigidity, or  rebound. Skin: warm and dry no rash Neuro: A&Ox4, CN II-XII grossly intact  Assessment and Plan:   1. Allergic rhinitis, unspecified allergic rhinitis type -itching related vs. Xerosis -vaseline in nose and on skin - fluticasone (FLONASE) 50 MCG/ACT nasal spray; Place 2 sprays into both nostrils daily.  Dispense: 16 g; Refill: 0 - azelastine (ASTELIN) 0.1 % nasal spray; Place 2 sprays into both nostrils 2 (two) times daily. Use in each nostril as directed  Dispense: 30 mL; Refill: 2 - Umeclidinium-Vilanterol (ANORO ELLIPTA) 62.5-25 MCG/INH AEPB; Inhale 1 puff into the lungs daily.  Dispense: 14 each; Refill: 0 - albuterol (PROVENTIL HFA;VENTOLIN HFA) 108 (90 Base) MCG/ACT inhaler; Inhale 1-2 puffs into the lungs every 6 (six) hours as needed for wheezing or shortness of breath (cough).  Dispense: 1 Inhaler; Refill: 0 - dexamethasone (DECADRON) injection 10 mg; Inject 1 mL (10 mg total) into the muscle once.  2. Depression -already on maximum doses of medication.  Recommend seeing psych -[facility] through [facility] as it is free service - Ambulatory referral to Psychiatry

## 2015-02-28 ENCOUNTER — Telehealth: Payer: Self-pay

## 2015-02-28 NOTE — Telephone Encounter (Signed)
No voicemail set up,mailed a new march calendar to patient

## 2015-03-01 ENCOUNTER — Telehealth: Payer: Self-pay | Admitting: Medical Oncology

## 2015-03-01 ENCOUNTER — Encounter: Payer: Self-pay | Admitting: Internal Medicine

## 2015-03-01 ENCOUNTER — Other Ambulatory Visit: Payer: Self-pay | Admitting: Internal Medicine

## 2015-03-01 MED ORDER — DEXAMETHASONE 1 MG PO TABS: 1.0000 mg | ORAL_TABLET | Freq: Two times a day (BID) | ORAL | Status: DC

## 2015-03-01 NOTE — Telephone Encounter (Signed)
Order for prosthesis faxed

## 2015-03-04 ENCOUNTER — Ambulatory Visit: Payer: Medicare Other | Admitting: Internal Medicine

## 2015-03-15 DIAGNOSIS — S92355D Nondisplaced fracture of fifth metatarsal bone, left foot, subsequent encounter for fracture with routine healing: Secondary | ICD-10-CM | POA: Diagnosis not present

## 2015-03-18 ENCOUNTER — Encounter: Payer: Self-pay | Admitting: Internal Medicine

## 2015-03-28 ENCOUNTER — Encounter: Payer: Self-pay | Admitting: *Deleted

## 2015-03-29 ENCOUNTER — Telehealth: Payer: Self-pay | Admitting: Internal Medicine

## 2015-03-29 ENCOUNTER — Other Ambulatory Visit: Payer: Self-pay | Admitting: Medical Oncology

## 2015-03-29 DIAGNOSIS — C9000 Multiple myeloma not having achieved remission: Secondary | ICD-10-CM

## 2015-03-29 NOTE — Telephone Encounter (Signed)
per pof to sch pt appt-cld pt and gave lab appt-adv to stop by sch to get time for blood trna awaiting a call back from [facility]

## 2015-03-30 ENCOUNTER — Other Ambulatory Visit: Payer: Self-pay | Admitting: Medical Oncology

## 2015-03-30 ENCOUNTER — Ambulatory Visit (HOSPITAL_COMMUNITY)
Admission: RE | Admit: 2015-03-30 | Discharge: 2015-03-30 | Disposition: A | Discharge status: Home / Self Care | Payer: Medicare Other | Source: Ambulatory Visit | Attending: Internal Medicine | Admitting: Internal Medicine

## 2015-03-30 ENCOUNTER — Ambulatory Visit (HOSPITAL_BASED_OUTPATIENT_CLINIC_OR_DEPARTMENT_OTHER): Payer: Medicare Other | Admitting: Nurse Practitioner

## 2015-03-30 ENCOUNTER — Other Ambulatory Visit (HOSPITAL_BASED_OUTPATIENT_CLINIC_OR_DEPARTMENT_OTHER): Payer: Medicare Other

## 2015-03-30 ENCOUNTER — Telehealth: Payer: Self-pay | Admitting: Nurse Practitioner

## 2015-03-30 VITALS — BP 113/54 | HR 87 | Temp 98.2°F | Resp 18 | Ht 65.0 in | Wt 154.1 lb

## 2015-03-30 DIAGNOSIS — J40 Bronchitis, not specified as acute or chronic: Secondary | ICD-10-CM | POA: Diagnosis not present

## 2015-03-30 DIAGNOSIS — D630 Anemia in neoplastic disease: Secondary | ICD-10-CM

## 2015-03-30 DIAGNOSIS — C9000 Multiple myeloma not having achieved remission: Secondary | ICD-10-CM

## 2015-03-30 DIAGNOSIS — D649 Anemia, unspecified: Secondary | ICD-10-CM | POA: Diagnosis not present

## 2015-03-30 DIAGNOSIS — R509 Fever, unspecified: Secondary | ICD-10-CM | POA: Diagnosis not present

## 2015-03-30 DIAGNOSIS — R059 Cough, unspecified: Secondary | ICD-10-CM

## 2015-03-30 DIAGNOSIS — N289 Disorder of kidney and ureter, unspecified: Secondary | ICD-10-CM

## 2015-03-30 DIAGNOSIS — R05 Cough: Secondary | ICD-10-CM

## 2015-03-30 LAB — CBC WITH DIFFERENTIAL/PLATELET
BASO%: 0.4 % (ref 0.0–2.0)
Basophils Absolute: 0 10*3/uL (ref 0.0–0.1)
EOS%: 1.9 % (ref 0.0–7.0)
Eosinophils Absolute: 0.2 10*3/uL (ref 0.0–0.5)
HEMATOCRIT: 30 % — AB (ref 34.8–46.6)
HEMOGLOBIN: 9.9 g/dL — AB (ref 11.6–15.9)
LYMPH#: 1.8 10*3/uL (ref 0.9–3.3)
LYMPH%: 17.3 % (ref 14.0–49.7)
MCH: 35.7 pg — ABNORMAL HIGH (ref 25.1–34.0)
MCHC: 32.9 g/dL (ref 31.5–36.0)
MCV: 108.4 fL — ABNORMAL HIGH (ref 79.5–101.0)
MONO#: 1.1 10*3/uL — ABNORMAL HIGH (ref 0.1–0.9)
MONO%: 10.9 % (ref 0.0–14.0)
NEUT%: 69.5 % (ref 38.4–76.8)
NEUTROS ABS: 7.3 10*3/uL — AB (ref 1.5–6.5)
Platelets: 221 10*3/uL (ref 145–400)
RBC: 2.77 10*6/uL — AB (ref 3.70–5.45)
RDW: 13 % (ref 11.2–14.5)
WBC: 10.6 10*3/uL — AB (ref 3.9–10.3)

## 2015-03-30 LAB — INFLUENZA A AND B
INFLUENZA A AG, EIA: NEGATIVE
INFLUENZA B AG, EIA: NEGATIVE

## 2015-03-30 MED ORDER — AMOXICILLIN-POT CLAVULANATE 875-125 MG PO TABS: 1.0000 | ORAL_TABLET | Freq: Two times a day (BID) | ORAL | Status: DC

## 2015-03-30 NOTE — Telephone Encounter (Signed)
Pt came in for labs per 03/15 POF, pt will be seeing NP/CB ...  KJ

## 2015-03-30 NOTE — Progress Notes (Signed)
Telephone call to pt to advise flu swab was negative. Pt verbalized an understanding to take antibiotic twice a day and increase water intake. Pt will call this office if symptoms fail to improve.

## 2015-03-31 ENCOUNTER — Ambulatory Visit (HOSPITAL_COMMUNITY)
Admission: RE | Admit: 2015-03-31 | Discharge: 2015-03-31 | Disposition: A | Discharge status: Home / Self Care | Payer: Medicare Other | Source: Ambulatory Visit | Attending: Internal Medicine | Admitting: Internal Medicine

## 2015-03-31 ENCOUNTER — Encounter: Payer: Self-pay | Admitting: Nurse Practitioner

## 2015-03-31 VITALS — BP 114/53 | HR 77 | Temp 98.4°F | Resp 20

## 2015-03-31 DIAGNOSIS — D649 Anemia, unspecified: Secondary | ICD-10-CM | POA: Diagnosis not present

## 2015-03-31 LAB — PREPARE RBC (CROSSMATCH)

## 2015-03-31 MED ORDER — HEPARIN SOD (PORK) LOCK FLUSH 100 UNIT/ML IV SOLN
500.0000 [IU] | Freq: Every day | INTRAVENOUS | Status: AC | PRN
  Administered 2015-03-31: 500 [IU]
  Filled 2015-03-31: qty 5

## 2015-03-31 MED ORDER — DIPHENHYDRAMINE HCL 25 MG PO CAPS
25.0000 mg | ORAL_CAPSULE | Freq: Once | ORAL | Status: AC
  Administered 2015-03-31: 25 mg via ORAL
  Filled 2015-03-31: qty 1

## 2015-03-31 MED ORDER — ACETAMINOPHEN 325 MG PO TABS
650.0000 mg | ORAL_TABLET | Freq: Once | ORAL | Status: AC
  Administered 2015-03-31: 650 mg via ORAL
  Filled 2015-03-31: qty 2

## 2015-03-31 MED ORDER — SODIUM CHLORIDE 0.9% FLUSH
10.0000 mL | INTRAVENOUS | Status: AC | PRN
  Administered 2015-03-31: 10 mL

## 2015-03-31 MED ORDER — SODIUM CHLORIDE 0.9 % IV SOLN
250.0000 mL | Freq: Once | INTRAVENOUS | Status: AC
  Administered 2015-03-31: 250 mL via INTRAVENOUS

## 2015-03-31 NOTE — Progress Notes (Signed)
PCP: Curt Bears  Associated Diagnosis: Symptomatic Anemia  Tolerated 1 unit PRBC transfusion well. No reaction. Alert, oriented and ambulatory at time of discharge. Discharged to home.

## 2015-03-31 NOTE — Assessment & Plan Note (Signed)
Patient's last chemotherapy was in December 2016.  Patient's hemoglobin today was 9.9.  Patient continues to complain of progressive fatigue.  She will receive a blood transfusion tomorrow for symptomatic anemia.

## 2015-03-31 NOTE — Assessment & Plan Note (Signed)
Patient last received Cytoxan/kyprolis chemotherapy on 01/04/2016.  She is currently on a chemotherapy holiday.  Patient states that she has recently developed a URI/bronchitis; is requesting evaluation and treatment today.  Also, patient is wearing a left lower extremity boot secondary to a recent fall with injury to both her ankle and her toes.  She is followed by her orthopedist.  Blood counts obtained today were essentially stable; but patient was mildly anemic with hemoglobin 9.9.  Patient is requesting to receive a blood transfusion tomorrow; since she feels symptomatic with progressive fatigue.  The plan is for the patient to return tomorrow to receive her blood transfusion.  She will then return on 04/07/2015 for labs and a follow-up visit.  Will discuss at that time with Dr. Julien Nordmann, the possibility of restarting chemotherapy.

## 2015-03-31 NOTE — Assessment & Plan Note (Addendum)
Patient states that she developed an upper respiratory infection with subsequent congested productive cough and nasal congestion within the past 10 days.  She states that the symptoms are progressively worsening.  She has had a low-grade fever intermittently as well.  Exam today reveals nasal congestion, posterior oropharynx is clear with no erythema or exudate.  Breath sounds essentially clear with some diminished basis.  Patient does have a fairly significant congested cough; but no acute respiratory distress or wheezing noted.  Vital signs were stable and O2 sat was 99%.  Also, patient was afebrile today.  Flu swab was negative today.  Patient will receive Augmentin antibiotics for treatment of bronchitis.  Of note-considered prescribing Levaquin for the patient; but there was a high risk of increased QTC prolongation with the combination of both Levaquin and patient's antidepressant.  Patient was advised to call/return or go directly to the emergency department for any worsening symptoms whatsoever.

## 2015-03-31 NOTE — Progress Notes (Signed)
SYMPTOM MANAGEMENT CLINIC   HPI: Nancy Norris 75 y.o. female diagnosed with multiple myeloma.  Agent is status post chemotherapy.  Currently undergoing a chemotherapy holiday.  Patient states that she developed an upper respiratory infection with subsequent congested productive cough and nasal congestion within the past 10 days.  She states that the symptoms are progressively worsening.  She has had a low-grade fever intermittently as well.  Exam today reveals nasal congestion, posterior oropharynx is clear with no erythema or exudate.  Breath sounds essentially clear with some diminished basis.  Patient does have a fairly significant congested cough; but no acute respiratory distress or wheezing noted.  Vital signs were stable and O2 sat was 99%.  Also, patient was afebrile today.  Flu swab was negative today.    HPI  Review of Systems  Constitutional: Positive for fever, chills and malaise/fatigue.  HENT: Positive for congestion. Negative for ear pain and sore throat.   Respiratory: Positive for cough and sputum production. Negative for shortness of breath and wheezing.   All other systems reviewed and are negative.   Past Medical History  Diagnosis Date  . Hyperkalemia   . Hypothyroidism   . COPD (chronic obstructive pulmonary disease) ([place])   . Hyponatremia   . Dizziness   . Fibromyalgia   . Breast cancer ([place])   . Multiple myeloma   . Mucositis   . Hx of echocardiogram     a.  Echocardiogram (12/26/2012): EF 94-70%, grade 1 diastolic dysfunction;   b.  Echocardiogram (02/2013): EF 55-60%, no WMA, trivial effusion  . Fibromyalgia   . Arthritis   . Hx of cardiovascular stress test     LexiScan with low level exercise Myoview (02/2013): No ischemia, EF 72%; normal study  . Complication of anesthesia   . PONV (postoperative nausea and vomiting) 2008    after mastestomy  . Myocardial infarction [facility])     in past, patient was unaware.   . Anginal pain ([place])     used  NTG x 2 May 31 and 06/15/13   . GERD (gastroesophageal reflux disease)   . Headache(784.0)   . Anxiety   . Depression   . CKD (chronic kidney disease) stage 3, GFR 30-59 ml/min   . Anemia   . History of blood transfusion     last one May 12   . Neuropathy ([place])   . B12 deficiency 12/04/2014    Past Surgical History  Procedure Laterality Date  . History of port removal    . Status post stem cell transplant on September 28, 2008.    Marland Kitchen Abdominal hysterectomy  1981  . Cholecystectomy  1971  . Mastectomy Left 2008  . Cataract extraction, bilateral    . Breast surgery    . Eye surgery Bilateral     lens implant  . Breast reconstruction    . Portacath placement  12/2012    has had 2  . Av fistula placement Left 06/19/2013    Procedure: CREATION OF LEFT ARM ARTERIOVENOUS (AV) FISTULA ;  Surgeon: Angelia Mould, MD;  Location: [facility];  Service: Vascular;  Laterality: Left;    has Hypercalcemia; HX: breast cancer; Asthma, chronic; Hypothyroid; Chronic diastolic heart failure (HCC); HTN (hypertension); GERD (gastroesophageal reflux disease); COPD (chronic obstructive pulmonary disease) with emphysema ([place]); Fibromyalgia; Mixed hyperlipidemia; Prediabetes; Vitamin D deficiency; Medication management; Depression, controlled; Multiple myeloma ([place]); Orthostatic hypotension; CKD (chronic kidney disease) stage 5, GFR less than 15 ml/min (HCC); Hypoalbuminemia; Encounter for  antineoplastic chemotherapy; CHF (congestive heart failure) ([place]); Dizziness; Malnutrition of moderate degree; B12 deficiency; Thrombocytopenia ([place]); SOB (shortness of breath); Nausea & vomiting; Neoplastic malignant related fatigue; Anemia in neoplastic disease; and Bronchitis on her problem list.    is allergic to codeine; latex; other; onion; zyprexa; adhesive; hydrocodone; iodinated diagnostic agents; and sulfa antibiotics.    Medication List       This list is accurate as of: 03/30/15 11:59 PM.  Always use your most  recent med list.               acetaminophen 325 MG tablet  Commonly known as:  TYLENOL  Take 650 mg by mouth every 6 (six) hours as needed for mild pain or moderate pain.     albuterol 108 (90 Base) MCG/ACT inhaler  Commonly known as:  PROVENTIL HFA;VENTOLIN HFA  Inhale 1-2 puffs into the lungs every 6 (six) hours as needed for wheezing or shortness of breath (cough).     amoxicillin-clavulanate 875-125 MG tablet  Commonly known as:  AUGMENTIN  Take 1 tablet by mouth 2 (two) times daily.     ascorbic acid 250 MG Chew  Commonly known as:  VITAMIN C  Chew 250 mg by mouth daily.     azelastine 0.1 % nasal spray  Commonly known as:  ASTELIN  Place 2 sprays into both nostrils 2 (two) times daily. Use in each nostril as directed     benzonatate 200 MG capsule  Commonly known as:  TESSALON  Take 1 capsule (200 mg total) by mouth 3 (three) times daily as needed for cough.     buPROPion 300 MG 24 hr tablet  Commonly known as:  WELLBUTRIN XL  Take 300 mg by mouth daily.     carboxymethylcellulose 0.5 % Soln  Commonly known as:  REFRESH PLUS  Place 1 drop into both eyes every 2 (two) hours.     Cholecalciferol 4000 units Tabs  Take 4,000 Units by mouth daily.     citalopram 40 MG tablet  Commonly known as:  CELEXA  Take 40 mg by mouth daily.     cromolyn 4 % ophthalmic solution  Commonly known as:  OPTICROM  Place 1 drop into both eyes 4 (four) times daily.     dexamethasone 4 MG tablet  Commonly known as:  DECADRON  Take 4 mg by mouth once a week.     dexamethasone 1 MG tablet  Commonly known as:  DECADRON  Take 1 tablet (1 mg total) by mouth 2 (two) times daily with a meal.     Ferrous Sulfate 134 MG Tabs  Take 1 tablet (134 mg total) by mouth every other day.     fluticasone 50 MCG/ACT nasal spray  Commonly known as:  FLONASE  Place 2 sprays into both nostrils daily.     gabapentin 300 MG capsule  Commonly known as:  NEURONTIN  TAKE 1 CAPSULE BY MOUTH AT  BEDTIME.     hydrOXYzine 25 MG tablet  Commonly known as:  ATARAX/VISTARIL  Take 1 tablet by mouth 3 (three) times daily as needed for vomiting. Reported on 01/11/2015     isosorbide mononitrate 30 MG 24 hr tablet  Commonly known as:  IMDUR  TAKE 1 TABLET BY MOUTH DAILY.     levothyroxine 150 MCG tablet  Commonly known as:  SYNTHROID, LEVOTHROID  TAKE 1 TABLET BY MOUTH ONCE DAILY.     loperamide 2 MG capsule  Commonly known as:  IMODIUM  Take two tabs po initially, then one tab after each loose stool: max 8 tabs in 24 hours     loratadine 10 MG tablet  Commonly known as:  CLARITIN  Take 10 mg by mouth daily.     LORazepam 0.5 MG tablet  Commonly known as:  ATIVAN  Take 1 tablet (0.5 mg total) by mouth every 8 (eight) hours as needed (nausea).     loteprednol 0.2 % Susp  Commonly known as:  ALREX  Place 1 drop into both eyes 4 (four) times daily.     meclizine 32 MG tablet  Commonly known as:  ANTIVERT  Take 1 tablet (32 mg total) by mouth 3 (three) times daily as needed for dizziness.     midodrine 10 MG tablet  Commonly known as:  PROAMATINE  Take 1 tablet (10 mg total) by mouth 3 (three) times daily.     nitroGLYCERIN 0.4 MG SL tablet  Commonly known as:  NITROSTAT  Place 0.4 mg under the tongue every 5 (five) minutes as needed for chest pain. Reported on 03/30/2015     ondansetron 8 MG disintegrating tablet  Commonly known as:  ZOFRAN-ODT  Take 1 tablet (8 mg total) by mouth every 8 (eight) hours as needed for nausea or vomiting.     pantoprazole 40 MG tablet  Commonly known as:  PROTONIX  Take 1 tablet daily for Acid reflux & indigestion     pilocarpine 5 MG tablet  Commonly known as:  SALAGEN  TAKE 1 TABLET BY MOUTH 3 TIMES DAILY.     prochlorperazine 10 MG tablet  Commonly known as:  COMPAZINE  Take 10 mg by mouth every 6 (six) hours as needed for nausea or vomiting.     ranitidine 300 MG tablet  Commonly known as:  ZANTAC  TAKE 1 TABLET BY MOUTH AT  BEDTIME, FOR ACID REFLUX AND INDIGESTION.     RESTASIS 0.05 % ophthalmic emulsion  Generic drug:  cycloSPORINE  Place 1 drop into both eyes 2 (two) times daily.     senna 8.6 MG tablet  Commonly known as:  SENOKOT  Take 1 tablet by mouth at bedtime.     sodium bicarbonate 650 MG tablet  Take 1 tablet (650 mg total) by mouth 2 (two) times daily.     triamcinolone cream 0.1 %  Commonly known as:  KENALOG  Apply 1 application topically 4 (four) times daily as needed.     umeclidinium-vilanterol 62.5-25 MCG/INH Aepb  Commonly known as:  ANORO ELLIPTA  Inhale 1 puff into the lungs daily.         PHYSICAL EXAMINATION  Oncology Vitals 03/31/2015 03/31/2015  Height - -  Weight - -  Weight (lbs) - -  BMI (kg/m2) - -  Temp 98.4 98.6  Pulse 77 76  Resp 20 20  SpO2 100 99  BSA (m2) - -   BP Readings from Last 2 Encounters:  03/31/15 114/53  03/30/15 113/54    Physical Exam  Constitutional: She is oriented to person, place, and time and well-developed, well-nourished, and in no distress.  HENT:  Head: Normocephalic and atraumatic.  Mouth/Throat: Oropharynx is clear and moist.  Nasal congestion; but no facial tenderness with palpation.  Eyes: Conjunctivae and EOM are normal. Pupils are equal, round, and reactive to light. Right eye exhibits no discharge. Left eye exhibits no discharge. No scleral icterus.  Neck: Normal range of motion. Neck supple. No JVD present. No tracheal deviation present. No thyromegaly  present.  Cardiovascular: Normal rate, regular rhythm, normal heart sounds and intact distal pulses.   Pulmonary/Chest: Effort normal. No respiratory distress. She has no wheezes. She has no rales. She exhibits no tenderness.  Essentially clear breath sounds with some decreased bases bilaterally.  Abdominal: Soft. Bowel sounds are normal. She exhibits no distension and no mass. There is no tenderness. There is no rebound and no guarding.  Musculoskeletal: Normal range of  motion. She exhibits no edema or tenderness.  Left lower extremity in orthopedic boot.  Lymphadenopathy:    She has no cervical adenopathy.  Neurological: She is alert and oriented to person, place, and time. Gait normal.  Skin: Skin is warm and dry. No rash noted. No erythema. No pallor.  Psychiatric: Affect normal.    LABORATORY DATA:. Appointment on 03/30/2015  Component Date Value Ref Range Status  . WBC 03/30/2015 10.6* 3.9 - 10.3 10e3/uL Final  . NEUT# 03/30/2015 7.3* 1.5 - 6.5 10e3/uL Final  . HGB 03/30/2015 9.9* 11.6 - 15.9 g/dL Final  . HCT 56/36/3301 30.0* 34.8 - 46.6 % Final  . Platelets 03/30/2015 221  145 - 400 10e3/uL Final  . MCV 03/30/2015 108.4* 79.5 - 101.0 fL Final  . MCH 03/30/2015 35.7* 25.1 - 34.0 pg Final  . MCHC 03/30/2015 32.9  31.5 - 36.0 g/dL Final  . RBC 97/36/1492 2.77* 3.70 - 5.45 10e6/uL Final  . RDW 03/30/2015 13.0  11.2 - 14.5 % Final  . lymph# 03/30/2015 1.8  0.9 - 3.3 10e3/uL Final  . MONO# 03/30/2015 1.1* 0.1 - 0.9 10e3/uL Final  . Eosinophils Absolute 03/30/2015 0.2  0.0 - 0.5 10e3/uL Final  . Basophils Absolute 03/30/2015 0.0  0.0 - 0.1 10e3/uL Final  . NEUT% 03/30/2015 69.5  38.4 - 76.8 % Final  . LYMPH% 03/30/2015 17.3  14.0 - 49.7 % Final  . MONO% 03/30/2015 10.9  0.0 - 14.0 % Final  . EOS% 03/30/2015 1.9  0.0 - 7.0 % Final  . BASO% 03/30/2015 0.4  0.0 - 2.0 % Final  . Influenza A Ag, EIA 03/30/2015 Negative  Negative Final  . Influenza B Ag, EIA 03/30/2015 Negative  Negative Final  . Influenza Comment 03/30/2015 See note   Final  . Please note: 03/30/2015 Comment   Final   Comment: Because a negative rapid influenza antigen EIA test may not rule-out influenza viral infection, the CDC and other public health agencies have recommended that confirmatory testing using viral culture or nucleic acid amplification should be considered when clinical signs and symptoms are consistent with influenza-like illness, as well as to assist in  detecting other respiratory viruses that can produce similar clinical symptoms. (Reference: [employer].is guidance_ridt.pdf)   Hospital Outpatient Visit on 03/30/2015  Component Date Value Ref Range Status  . ABO/RH(D) 03/30/2015 O POS   Final  . Antibody Screen 03/30/2015 NEG   Final  . Sample Expiration 03/30/2015 04/02/2015   Final  . Unit Number 03/30/2015 [id number]   Final  . Blood Component Type 03/30/2015 RBC, LR IRR   Final  . Unit division 03/30/2015 00   Final  . Status of Unit 03/30/2015 ISSUED   Final  . Transfusion Status 03/30/2015 OK TO TRANSFUSE   Final  . Crossmatch Result 03/30/2015 Compatible   Final  . Order Confirmation 03/31/2015 ORDER PROCESSED BY BLOOD BANK   Final     RADIOGRAPHIC STUDIES: No results found.  ASSESSMENT/PLAN:    Multiple myeloma (HCC) Patient last received Cytoxan/kyprolis chemotherapy on 01/04/2016.  She is  currently on a chemotherapy holiday.  Patient states that she has recently developed a URI/bronchitis; is requesting evaluation and treatment today.  Also, patient is wearing a left lower extremity boot secondary to a recent fall with injury to both her ankle and her toes.  She is followed by her orthopedist.  Blood counts obtained today were essentially stable; but patient was mildly anemic with hemoglobin 9.9.  Patient is requesting to receive a blood transfusion tomorrow; since she feels symptomatic with progressive fatigue.  The plan is for the patient to return tomorrow to receive her blood transfusion.  She will then return on 04/07/2015 for labs and a follow-up visit.  Will discuss at that time with Dr. Julien Nordmann, the possibility of restarting chemotherapy.  Bronchitis Patient states that she developed an upper respiratory infection with subsequent congested productive cough and nasal congestion within the past 10 days.  She states that the symptoms are progressively worsening.  She has  had a low-grade fever intermittently as well.  Exam today reveals nasal congestion, posterior oropharynx is clear with no erythema or exudate.  Breath sounds essentially clear with some diminished basis.  Patient does have a fairly significant congested cough; but no acute respiratory distress or wheezing noted.  Vital signs were stable and O2 sat was 99%.  Also, patient was afebrile today.  Flu swab was negative today.  Patient will receive Augmentin antibiotics for treatment of bronchitis.  Of note-considered prescribing Levaquin for the patient; but there was a high risk of increased QTC prolongation with the combination of both Levaquin and patient's antidepressant.  Patient was advised to call/return or go directly to the emergency department for any worsening symptoms whatsoever.  Anemia in neoplastic disease Patient's last chemotherapy was in December 2016.  Patient's hemoglobin today was 9.9.  Patient continues to complain of progressive fatigue.  She will receive a blood transfusion tomorrow for symptomatic anemia.  Patient stated understanding of all instructions; and was in agreement with this plan of care. The patient knows to call the clinic with any problems, questions or concerns.   Review/collaboration with Dr. Julien Nordmann regarding all aspects of patient's visit today.   Total time spent with patient was 25 minutes;  with greater than 75 percent of that time spent in face to face counseling regarding patient's symptoms,  and coordination of care and follow up.  Disclaimer:This dictation was prepared with Dragon/digital dictation along with [employer]. Any transcriptional errors that result from this process are unintentional.  Drue Second, NP 03/31/2015

## 2015-04-01 ENCOUNTER — Telehealth: Payer: Self-pay | Admitting: *Deleted

## 2015-04-01 LAB — TYPE AND SCREEN
ABO/RH(D): O POS
Antibody Screen: NEGATIVE
UNIT DIVISION: 0

## 2015-04-01 NOTE — Telephone Encounter (Signed)
LM for rtn call to check status of patient follow [facility] visit on 3/15

## 2015-04-04 ENCOUNTER — Other Ambulatory Visit: Payer: Self-pay | Admitting: Cardiovascular Disease

## 2015-04-05 ENCOUNTER — Ambulatory Visit (HOSPITAL_COMMUNITY)
Admission: RE | Admit: 2015-04-05 | Discharge: 2015-04-05 | Disposition: A | Discharge status: Home / Self Care | Payer: Medicare Other | Source: Ambulatory Visit | Attending: Cardiovascular Disease | Admitting: Cardiovascular Disease

## 2015-04-05 ENCOUNTER — Other Ambulatory Visit: Payer: Self-pay | Admitting: *Deleted

## 2015-04-05 DIAGNOSIS — K219 Gastro-esophageal reflux disease without esophagitis: Secondary | ICD-10-CM | POA: Diagnosis not present

## 2015-04-05 DIAGNOSIS — I6523 Occlusion and stenosis of bilateral carotid arteries: Secondary | ICD-10-CM | POA: Diagnosis not present

## 2015-04-05 DIAGNOSIS — R0989 Other specified symptoms and signs involving the circulatory and respiratory systems: Secondary | ICD-10-CM | POA: Diagnosis not present

## 2015-04-05 MED ORDER — CITALOPRAM HYDROBROMIDE 40 MG PO TABS: 40.0000 mg | ORAL_TABLET | Freq: Every day | ORAL | Status: DC

## 2015-04-07 ENCOUNTER — Other Ambulatory Visit (HOSPITAL_BASED_OUTPATIENT_CLINIC_OR_DEPARTMENT_OTHER): Payer: Medicare Other

## 2015-04-07 DIAGNOSIS — C9000 Multiple myeloma not having achieved remission: Secondary | ICD-10-CM

## 2015-04-07 LAB — COMPREHENSIVE METABOLIC PANEL
ALT: 18 U/L (ref 0–55)
ANION GAP: 10 meq/L (ref 3–11)
AST: 27 U/L (ref 5–34)
Albumin: 3.6 g/dL (ref 3.5–5.0)
Alkaline Phosphatase: 125 U/L (ref 40–150)
BUN: 21.1 mg/dL (ref 7.0–26.0)
CHLORIDE: 110 meq/L — AB (ref 98–109)
CO2: 20 meq/L — AB (ref 22–29)
Calcium: 9.7 mg/dL (ref 8.4–10.4)
Creatinine: 3 mg/dL (ref 0.6–1.1)
EGFR: 15 mL/min/{1.73_m2} — AB (ref >90)
GLUCOSE: 94 mg/dL (ref 70–140)
Potassium: 3.7 mEq/L (ref 3.5–5.1)
SODIUM: 140 meq/L (ref 136–145)
Total Bilirubin: 0.47 mg/dL (ref 0.20–1.20)
Total Protein: 6.7 g/dL (ref 6.4–8.3)

## 2015-04-07 LAB — CBC WITH DIFFERENTIAL/PLATELET
BASO%: 0.7 % (ref 0.0–2.0)
Basophils Absolute: 0.1 10*3/uL (ref 0.0–0.1)
EOS%: 1.8 % (ref 0.0–7.0)
Eosinophils Absolute: 0.2 10*3/uL (ref 0.0–0.5)
HCT: 34.7 % — ABNORMAL LOW (ref 34.8–46.6)
HGB: 11.3 g/dL — ABNORMAL LOW (ref 11.6–15.9)
LYMPH%: 17.6 % (ref 14.0–49.7)
MCH: 34.5 pg — ABNORMAL HIGH (ref 25.1–34.0)
MCHC: 32.7 g/dL (ref 31.5–36.0)
MCV: 105.6 fL — AB (ref 79.5–101.0)
MONO#: 1 10*3/uL — AB (ref 0.1–0.9)
MONO%: 11.1 % (ref 0.0–14.0)
NEUT#: 6 10*3/uL (ref 1.5–6.5)
NEUT%: 68.8 % (ref 38.4–76.8)
PLATELETS: 208 10*3/uL (ref 145–400)
RBC: 3.28 10*6/uL — AB (ref 3.70–5.45)
RDW: 14.7 % — ABNORMAL HIGH (ref 11.2–14.5)
WBC: 8.7 10*3/uL (ref 3.9–10.3)
lymph#: 1.5 10*3/uL (ref 0.9–3.3)

## 2015-04-07 LAB — LACTATE DEHYDROGENASE: LDH: 186 U/L (ref 125–245)

## 2015-04-08 LAB — IGG, IGA, IGM
IGA/IMMUNOGLOBULIN A, SERUM: 166 mg/dL (ref 64–422)
IgG, Qn, Serum: 391 mg/dL — ABNORMAL LOW (ref 700–1600)
IgM, Qn, Serum: 9 mg/dL — ABNORMAL LOW (ref 26–217)

## 2015-04-09 ENCOUNTER — Encounter: Payer: Self-pay | Admitting: *Deleted

## 2015-04-14 ENCOUNTER — Encounter: Payer: Self-pay | Admitting: Internal Medicine

## 2015-04-14 ENCOUNTER — Telehealth: Payer: Self-pay | Admitting: Internal Medicine

## 2015-04-14 ENCOUNTER — Ambulatory Visit (HOSPITAL_BASED_OUTPATIENT_CLINIC_OR_DEPARTMENT_OTHER): Payer: Medicare Other | Admitting: Internal Medicine

## 2015-04-14 VITALS — BP 114/54 | HR 76 | Temp 98.0°F | Resp 17 | Ht 65.0 in | Wt 150.8 lb

## 2015-04-14 DIAGNOSIS — C9000 Multiple myeloma not having achieved remission: Secondary | ICD-10-CM

## 2015-04-14 DIAGNOSIS — N189 Chronic kidney disease, unspecified: Secondary | ICD-10-CM | POA: Diagnosis not present

## 2015-04-14 DIAGNOSIS — R530 Neoplastic (malignant) related fatigue: Secondary | ICD-10-CM | POA: Diagnosis not present

## 2015-04-14 DIAGNOSIS — F329 Major depressive disorder, single episode, unspecified: Secondary | ICD-10-CM | POA: Diagnosis not present

## 2015-04-14 NOTE — Progress Notes (Signed)
Nancy [phone]   [phone]  OFFICE PROGRESS NOTE  Nancy Richards, MD [address] [place] [address]  PRINCIPAL DIAGNOSES:  1. Recurrent multiple myeloma initially diagnosed in 2002, initially with smoldering myeloma at [facility].  2. Ductal carcinoma in situ status post mastectomy with sentinel lymph node biopsy in October 2008.  PRIOR THERAPY:  1. Status post treatment with tamoxifen from November 2008 through February 2009, discontinued secondary to intolerance. 2. Status post 3 cycles of chemotherapy with Revlimid and Decadron followed by 1 cycle of Decadron only with mild response. 3. Status post 2 cycles of systemic chemotherapy with Velcade, Doxil and Decadron discontinued secondary to significant peripheral neuropathy. Last dose was given May 2010 at [facility]. 4. Status post autologous peripheral blood stem cell transplant on October 01, 2008 at [facility] under the care of Dr. Phyllis Norris.  5. Systemic chemotherapy with Carfilzomib initially was 20 mg/M2 and will be increased after cycle #1 to 36 mg/M2 on days 1, 2, 8, 9, 15 and 16 every 4 weeks in addition to Cytoxan 300 mg/M2 and Decadron 40 mg by mouth weekly basis, status post 4 cycles. First cycle on 12/29/2012. 6. She resumed chemotherapy with Carfilzomib, Cytoxan, and Decadron on 05/03/2014. Status post 6 cycles.  CURRENT THERAPY: Observation.  INTERVAL HISTORY: Nancy Norris 74 y.o. female returns to the clinic today for follow-up visit. The patient continues to complain of increasing fatigue and weakness. She is also suffering from depression and was seen by her primary care physician. He referred her to a psychologist for evaluation. She received 1 unit of PRBCs transfusion few weeks ago. She has end-stage renal disease but she is not interested in hemodialysis. She denied having any significant chest pain,  shortness of breath, cough or hemoptysis. She denied having any bleeding issues. The patient denied having any significant nausea or vomiting. She has no fever or chills. She has no significant weight loss or night sweats. She had repeat myeloma panel performed recently and she is here for evaluation and discussion of her lab results.  MEDICAL HISTORY: Past Medical History  Diagnosis Date  . Hyperkalemia   . Hypothyroidism   . COPD (chronic obstructive pulmonary disease) ([place])   . Hyponatremia   . Dizziness   . Fibromyalgia   . Breast cancer ([place])   . Multiple myeloma   . Mucositis   . Hx of echocardiogram     a.  Echocardiogram (12/26/2012): EF 84-13%, grade 1 diastolic dysfunction;   b.  Echocardiogram (02/2013): EF 55-60%, no WMA, trivial effusion  . Fibromyalgia   . Arthritis   . Hx of cardiovascular stress test     LexiScan with low level exercise Myoview (02/2013): No ischemia, EF 72%; normal study  . Complication of anesthesia   . PONV (postoperative nausea and vomiting) 2008    after mastestomy  . Myocardial infarction [facility])     in past, patient was unaware.   . Anginal pain ([place])     used NTG x 2 May 31 and 06/15/13   . GERD (gastroesophageal reflux disease)   . Headache(784.0)   . Anxiety   . Depression   . CKD (chronic kidney disease) stage 3, GFR 30-59 ml/min   . Anemia   . History of blood transfusion     last one May 12   . Neuropathy ([place])   . B12 deficiency 12/04/2014    ALLERGIES:  is allergic to codeine; latex; other; onion; zyprexa; adhesive; hydrocodone; iodinated diagnostic agents; and sulfa antibiotics.  MEDICATIONS:  Current Outpatient Prescriptions  Medication Sig Dispense Refill  . acetaminophen (TYLENOL) 325 MG tablet Take 650 mg by mouth every 6 (six) hours as needed for mild pain or moderate pain.     Marland Kitchen albuterol (PROVENTIL HFA;VENTOLIN HFA) 108 (90 Base) MCG/ACT inhaler Inhale 1-2 puffs into the lungs every 6 (six) hours as needed for wheezing or  shortness of breath (cough). 1 Inhaler 0  . amoxicillin-clavulanate (AUGMENTIN) 875-125 MG tablet Take 1 tablet by mouth 2 (two) times daily. 20 tablet 0  . ascorbic acid (VITAMIN C) 250 MG CHEW Chew 250 mg by mouth daily.    Marland Kitchen azelastine (ASTELIN) 0.1 % nasal spray Place 2 sprays into both nostrils 2 (two) times daily. Use in each nostril as directed 30 mL 2  . benzonatate (TESSALON) 200 MG capsule Take 1 capsule (200 mg total) by mouth 3 (three) times daily as needed for cough. 30 capsule 4  . buPROPion (WELLBUTRIN XL) 300 MG 24 hr tablet Take 300 mg by mouth daily.   98  . carboxymethylcellulose (REFRESH PLUS) 0.5 % SOLN Place 1 drop into both eyes every 2 (two) hours.    . Cholecalciferol 4000 UNITS TABS Take 4,000 Units by mouth daily.    . citalopram (CELEXA) 40 MG tablet Take 1 tablet (40 mg total) by mouth daily. 30 tablet 3  . cromolyn (OPTICROM) 4 % ophthalmic solution Place 1 drop into both eyes 4 (four) times daily.    Marland Kitchen dexamethasone (DECADRON) 1 MG tablet Take 1 tablet (1 mg total) by mouth 2 (two) times daily with a meal. 10 tablet 0  . dexamethasone (DECADRON) 4 MG tablet Take 4 mg by mouth once a week.    . Ferrous Sulfate 134 MG TABS Take 1 tablet (134 mg total) by mouth every other day. 60 tablet 2  . fluticasone (FLONASE) 50 MCG/ACT nasal spray Place 2 sprays into both nostrils daily. 16 g 0  . gabapentin (NEURONTIN) 300 MG capsule TAKE 1 CAPSULE BY MOUTH AT BEDTIME. 90 capsule 3  . hydrOXYzine (ATARAX/VISTARIL) 25 MG tablet Take 1 tablet by mouth 3 (three) times daily as needed for vomiting. Reported on 01/11/2015  1  . isosorbide mononitrate (IMDUR) 30 MG 24 hr tablet TAKE 1 TABLET BY MOUTH DAILY. 30 tablet 8  . levothyroxine (SYNTHROID, LEVOTHROID) 150 MCG tablet TAKE 1 TABLET BY MOUTH ONCE DAILY. 90 tablet 1  . loperamide (IMODIUM) 2 MG capsule Take two tabs po initially, then one tab after each loose stool: max 8 tabs in 24 hours 30 capsule 0  . loratadine (CLARITIN) 10 MG  tablet Take 10 mg by mouth daily.    Marland Kitchen LORazepam (ATIVAN) 0.5 MG tablet Take 1 tablet (0.5 mg total) by mouth every 8 (eight) hours as needed (nausea). 30 tablet 0  . loteprednol (ALREX) 0.2 % SUSP Place 1 drop into both eyes 4 (four) times daily. 1 Bottle PRN  . meclizine (ANTIVERT) 32 MG tablet Take 1 tablet (32 mg total) by mouth 3 (three) times daily as needed for dizziness. 30 tablet 3  . midodrine (PROAMATINE) 10 MG tablet Take 1 tablet (10 mg total) by mouth 3 (three) times daily. 90 tablet 11  . nitroGLYCERIN (NITROSTAT) 0.4 MG SL tablet Place 0.4 mg under the tongue every 5 (five) minutes as needed for chest pain. Reported on 03/30/2015    . ondansetron (ZOFRAN-ODT) 8 MG  disintegrating tablet Take 1 tablet (8 mg total) by mouth every 8 (eight) hours as needed for nausea or vomiting. (Patient not taking: Reported on 03/30/2015) 20 tablet 3  . pantoprazole (PROTONIX) 40 MG tablet Take 1 tablet daily for Acid reflux & indigestion 90 tablet 1  . pilocarpine (SALAGEN) 5 MG tablet TAKE 1 TABLET BY MOUTH 3 TIMES DAILY. 90 tablet 1  . prochlorperazine (COMPAZINE) 10 MG tablet Take 10 mg by mouth every 6 (six) hours as needed for nausea or vomiting.    . ranitidine (ZANTAC) 300 MG tablet TAKE 1 TABLET BY MOUTH AT BEDTIME, FOR ACID REFLUX AND INDIGESTION. 30 tablet PRN  . RESTASIS 0.05 % ophthalmic emulsion Place 1 drop into both eyes 2 (two) times daily.   4  . senna (SENOKOT) 8.6 MG tablet Take 1 tablet by mouth at bedtime.    . sodium bicarbonate 650 MG tablet Take 1 tablet (650 mg total) by mouth 2 (two) times daily. 60 tablet 0  . triamcinolone cream (KENALOG) 0.1 % Apply 1 application topically 4 (four) times daily as needed. 30 g 0  . Umeclidinium-Vilanterol (ANORO ELLIPTA) 62.5-25 MCG/INH AEPB Inhale 1 puff into the lungs daily. 14 each 0   No current facility-administered medications for this visit.   Facility-Administered Medications Ordered in Other Visits  Medication Dose Route Frequency  Provider Last Rate Last Dose  . 0.9 %  sodium chloride infusion   Intravenous Once Nancy E Johnson, PA-C      . sodium chloride 0.9 % injection 10 mL  10 mL Intracatheter PRN Nancy Bears, MD   10 mL at 01/05/13 1825  . sodium chloride 0.9 % injection 10 mL  10 mL Intracatheter PRN Nancy Bears, MD   10 mL at 06/15/14 1514    SURGICAL HISTORY:  Past Surgical History  Procedure Laterality Date  . History of port removal    . Status post stem cell transplant on September 28, 2008.    Marland Kitchen Abdominal hysterectomy  1981  . Cholecystectomy  1971  . Mastectomy Left 2008  . Cataract extraction, bilateral    . Breast surgery    . Eye surgery Bilateral     lens implant  . Breast reconstruction    . Portacath placement  12/2012    has had 2  . Av fistula placement Left 06/19/2013    Procedure: CREATION OF LEFT ARM ARTERIOVENOUS (AV) FISTULA ;  Surgeon: Nancy Mould, MD;  Location: [facility] OR;  Service: Vascular;  Laterality: Left;    REVIEW OF SYSTEMS:  A comprehensive review of systems was negative except for: Constitutional: positive for fatigue Behavioral/Psych: positive for depression   PHYSICAL EXAMINATION: General appearance: alert, cooperative and no distress Head: Normocephalic, without obvious abnormality, atraumatic Neck: no adenopathy, no JVD, supple, symmetrical, trachea midline and thyroid not enlarged, symmetric, no tenderness/mass/nodules Lymph nodes: Cervical, supraclavicular, and axillary nodes normal. Resp: clear to auscultation bilaterally Back: symmetric, no curvature. ROM normal. No CVA tenderness. Cardio: regular rate and rhythm, S1, S2 normal, no murmur, click, rub or gallop GI: soft, non-tender; bowel sounds normal; no masses,  no organomegaly Extremities: extremities normal, atraumatic, no cyanosis or edema Neurologic: Alert and oriented X 3, normal strength and tone. Normal symmetric reflexes. Normal coordination and gait  ECOG PERFORMANCE STATUS: 1 -  Symptomatic but completely ambulatory  Blood pressure 114/54, pulse 76, temperature 98 F (36.7 C), temperature source Oral, resp. rate 17, height _0  (1.651 m), weight 150 lb 12.8 oz (68.402 kg),  SpO2 100 %.  LABORATORY DATA: Lab Results  Component Value Date   WBC 8.7 04/07/2015   HGB 11.3* 04/07/2015   HCT 34.7* 04/07/2015   MCV 105.6* 04/07/2015   PLT 208 04/07/2015      Chemistry      Component Value Date/Time   NA 140 04/07/2015 1112   NA 141 12/07/2014 0515   K 3.7 04/07/2015 1112   K 3.8 12/07/2014 0515   CL 115* 12/07/2014 0515   CL 105 03/19/2012 0811   CO2 20* 04/07/2015 1112   CO2 19* 12/07/2014 0515   BUN 21.1 04/07/2015 1112   BUN 24* 12/07/2014 0515   CREATININE 3.0* 04/07/2015 1112   CREATININE 2.74* 12/07/2014 0515   CREATININE 3.20* 08/13/2014 0959      Component Value Date/Time   CALCIUM 9.7 04/07/2015 1112   CALCIUM 8.4* 12/07/2014 0515   ALKPHOS 125 04/07/2015 1112   ALKPHOS 70 12/03/2014 0300   AST 27 04/07/2015 1112   AST 13* 12/03/2014 0300   ALT 18 04/07/2015 1112   ALT 27 12/03/2014 0300   BILITOT 0.47 04/07/2015 1112   BILITOT 1.3* 12/03/2014 0300     RADIOGRAPHIC STUDIES: No results found.  ASSESSMENT AND PLAN: This is a very pleasant 75 years old white female with history of multiple myeloma as well as chronic kidney disease status post several chemotherapy regimen and most recently treated with Carfilzomib, Cytoxan and Decadron status post 5 cycles. The patient has been tolerating her treatment well except for the fatigue secondary to end-stage renal disease as well as anemia of chronic disease. The patient is currently on observation and feeling fine with no concerning findings for disease progression. I discussed the lab result with the patient today. I recommended for her to continue on observation with repeat myeloma panel in 3 months. For depression, the patient was referred by her primary care physician to see a  psychologist. For the chronic kidney disease, the patient will continue her routine follow-up visit with her nephrologist. The patient was advised to call immediately if she has any concerning symptoms in the interval. The patient voices understanding of current disease status and treatment options and is in agreement with the current care plan.  All questions were answered. The patient knows to call the clinic with any problems, questions or concerns. We can certainly see the patient much sooner if necessary.  Disclaimer: This note was dictated with voice recognition software. Similar sounding words can inadvertently be transcribed and may not be corrected upon review.

## 2015-04-14 NOTE — Telephone Encounter (Signed)
Gave and printed appt sched and avs for pt for June °

## 2015-04-20 DIAGNOSIS — N2581 Secondary hyperparathyroidism of renal origin: Secondary | ICD-10-CM | POA: Diagnosis not present

## 2015-04-20 DIAGNOSIS — R809 Proteinuria, unspecified: Secondary | ICD-10-CM | POA: Diagnosis not present

## 2015-04-20 DIAGNOSIS — I5030 Unspecified diastolic (congestive) heart failure: Secondary | ICD-10-CM | POA: Diagnosis not present

## 2015-04-20 DIAGNOSIS — I951 Orthostatic hypotension: Secondary | ICD-10-CM | POA: Diagnosis not present

## 2015-04-20 DIAGNOSIS — N184 Chronic kidney disease, stage 4 (severe): Secondary | ICD-10-CM | POA: Diagnosis not present

## 2015-04-20 DIAGNOSIS — R079 Chest pain, unspecified: Secondary | ICD-10-CM | POA: Diagnosis not present

## 2015-04-20 DIAGNOSIS — C9000 Multiple myeloma not having achieved remission: Secondary | ICD-10-CM | POA: Diagnosis not present

## 2015-04-20 DIAGNOSIS — I770 Arteriovenous fistula, acquired: Secondary | ICD-10-CM | POA: Diagnosis not present

## 2015-04-20 DIAGNOSIS — D630 Anemia in neoplastic disease: Secondary | ICD-10-CM | POA: Diagnosis not present

## 2015-04-27 DIAGNOSIS — Z853 Personal history of malignant neoplasm of breast: Secondary | ICD-10-CM | POA: Diagnosis not present

## 2015-04-27 DIAGNOSIS — Z1231 Encounter for screening mammogram for malignant neoplasm of breast: Secondary | ICD-10-CM | POA: Diagnosis not present

## 2015-05-02 ENCOUNTER — Encounter: Payer: Self-pay | Admitting: Internal Medicine

## 2015-05-27 ENCOUNTER — Ambulatory Visit (INDEPENDENT_AMBULATORY_CARE_PROVIDER_SITE_OTHER): Payer: Medicare Other | Admitting: Internal Medicine

## 2015-05-27 ENCOUNTER — Encounter: Payer: Self-pay | Admitting: Internal Medicine

## 2015-05-27 VITALS — BP 120/58 | HR 72 | Temp 97.7°F | Resp 16 | Ht 65.0 in | Wt 150.2 lb

## 2015-05-27 DIAGNOSIS — D509 Iron deficiency anemia, unspecified: Secondary | ICD-10-CM

## 2015-05-27 DIAGNOSIS — Z79899 Other long term (current) drug therapy: Secondary | ICD-10-CM

## 2015-05-27 DIAGNOSIS — E538 Deficiency of other specified B group vitamins: Secondary | ICD-10-CM | POA: Diagnosis not present

## 2015-05-27 DIAGNOSIS — I959 Hypotension, unspecified: Secondary | ICD-10-CM | POA: Diagnosis not present

## 2015-05-27 DIAGNOSIS — R7309 Other abnormal glucose: Secondary | ICD-10-CM | POA: Diagnosis not present

## 2015-05-27 DIAGNOSIS — E559 Vitamin D deficiency, unspecified: Secondary | ICD-10-CM

## 2015-05-27 DIAGNOSIS — I10 Essential (primary) hypertension: Secondary | ICD-10-CM | POA: Diagnosis not present

## 2015-05-27 DIAGNOSIS — N185 Chronic kidney disease, stage 5: Secondary | ICD-10-CM

## 2015-05-27 DIAGNOSIS — C9000 Multiple myeloma not having achieved remission: Secondary | ICD-10-CM

## 2015-05-27 DIAGNOSIS — J4520 Mild intermittent asthma, uncomplicated: Secondary | ICD-10-CM

## 2015-05-27 DIAGNOSIS — Z136 Encounter for screening for cardiovascular disorders: Secondary | ICD-10-CM | POA: Diagnosis not present

## 2015-05-27 DIAGNOSIS — Z1212 Encounter for screening for malignant neoplasm of rectum: Secondary | ICD-10-CM

## 2015-05-27 DIAGNOSIS — E039 Hypothyroidism, unspecified: Secondary | ICD-10-CM

## 2015-05-27 DIAGNOSIS — R7303 Prediabetes: Secondary | ICD-10-CM

## 2015-05-27 DIAGNOSIS — E782 Mixed hyperlipidemia: Secondary | ICD-10-CM

## 2015-05-27 DIAGNOSIS — K219 Gastro-esophageal reflux disease without esophagitis: Secondary | ICD-10-CM

## 2015-05-27 LAB — CBC WITH DIFFERENTIAL/PLATELET
BASOS ABS: 0 {cells}/uL (ref 0–200)
Basophils Relative: 0 %
EOS PCT: 2 %
Eosinophils Absolute: 158 cells/uL (ref 15–500)
HEMATOCRIT: 32.9 % — AB (ref 35.0–45.0)
HEMOGLOBIN: 10.7 g/dL — AB (ref 11.7–15.5)
LYMPHS ABS: 1501 {cells}/uL (ref 850–3900)
LYMPHS PCT: 19 %
MCH: 32.3 pg (ref 27.0–33.0)
MCHC: 32.5 g/dL (ref 32.0–36.0)
MCV: 99.4 fL (ref 80.0–100.0)
MPV: 9 fL (ref 7.5–12.5)
Monocytes Absolute: 869 cells/uL (ref 200–950)
Monocytes Relative: 11 %
NEUTROS PCT: 68 %
Neutro Abs: 5372 cells/uL (ref 1500–7800)
Platelets: 167 10*3/uL (ref 140–400)
RBC: 3.31 MIL/uL — ABNORMAL LOW (ref 3.80–5.10)
RDW: 13.8 % (ref 11.0–15.0)
WBC: 7.9 10*3/uL (ref 3.8–10.8)

## 2015-05-27 LAB — HEPATIC FUNCTION PANEL
ALT: 19 U/L (ref 6–29)
AST: 18 U/L (ref 10–35)
Albumin: 4.2 g/dL (ref 3.6–5.1)
Alkaline Phosphatase: 110 U/L (ref 33–130)
BILIRUBIN DIRECT: 0.2 mg/dL (ref <=0.2)
BILIRUBIN INDIRECT: 0.5 mg/dL (ref 0.2–1.2)
BILIRUBIN TOTAL: 0.7 mg/dL (ref 0.2–1.2)
Total Protein: 6.2 g/dL (ref 6.1–8.1)

## 2015-05-27 LAB — HEMOGLOBIN A1C
Hgb A1c MFr Bld: 5.4 % (ref <5.7)
Mean Plasma Glucose: 108 mg/dL

## 2015-05-27 LAB — BASIC METABOLIC PANEL WITH GFR
BUN: 34 mg/dL — ABNORMAL HIGH (ref 7–25)
CO2: 18 mmol/L — ABNORMAL LOW (ref 20–31)
CREATININE: 3.69 mg/dL — AB (ref 0.60–0.93)
Calcium: 9.1 mg/dL (ref 8.6–10.4)
Chloride: 113 mmol/L — ABNORMAL HIGH (ref 98–110)
GFR, EST NON AFRICAN AMERICAN: 12 mL/min — AB (ref >=60)
GFR, Est African American: 13 mL/min — ABNORMAL LOW (ref >=60)
Glucose, Bld: 104 mg/dL — ABNORMAL HIGH (ref 65–99)
POTASSIUM: 3.7 mmol/L (ref 3.5–5.3)
SODIUM: 142 mmol/L (ref 135–146)

## 2015-05-27 LAB — IRON AND TIBC
%SAT: 65 % — ABNORMAL HIGH (ref 11–50)
Iron: 124 ug/dL (ref 45–160)
TIBC: 191 ug/dL — AB (ref 250–450)
UIBC: 67 ug/dL — ABNORMAL LOW (ref 125–400)

## 2015-05-27 LAB — TSH: TSH: 0.01 mIU/L — ABNORMAL LOW

## 2015-05-27 LAB — LIPID PANEL
CHOL/HDL RATIO: 4 ratio (ref <=5.0)
CHOLESTEROL: 157 mg/dL (ref 125–200)
HDL: 39 mg/dL — AB (ref >=46)
LDL Cholesterol: 80 mg/dL (ref <130)
Triglycerides: 188 mg/dL — ABNORMAL HIGH (ref <150)
VLDL: 38 mg/dL — AB (ref <30)

## 2015-05-27 LAB — MAGNESIUM: MAGNESIUM: 2.1 mg/dL (ref 1.5–2.5)

## 2015-05-27 LAB — VITAMIN B12: VITAMIN B 12: 318 pg/mL (ref 200–1100)

## 2015-05-27 MED ORDER — RANITIDINE HCL 300 MG PO TABS: ORAL_TABLET | ORAL | Status: DC

## 2015-05-27 NOTE — Progress Notes (Signed)
Patient ID: Nancy Norris, female   DOB: Jul 30, 1940, 75 y.o.   MRN: [id number]  [facility]OL[facility]NAL MEDICINE   [name], M.D.    Uvaldo Bristle. Silverio Lay, P.A.-C      Starlyn Skeans, P.A.-C   [facility]                [address] [place], [place] [address] Telephone [phone] Telefax [phone] _________________________________  Comprehensive Evaluation & Examination     This very nice 75 y.o. DWF presents for a  comprehensive evaluation and management of multiple medical co-morbidities.  Patient has been followed for Hypotension,HLD, Prediabetes, Hyperlipidemia and Vitamin D Deficiency. Pertinent hx reveals MM dx'd in 2002 and treated and then in 2010 she underwent an autologous Stem Cell transplant. In 2008 She had a partial L mastectomy for DCIS. Chemotx is currently on hold with Active Surveillance by Dr Julien Nordmann. Patient is also followed by Dr Marval Regal for her ESRD/CKD5 (GFR runs 10-15 ml/min) and she has had a L brachial AVF created (06/2013), but she adamantly disavows consideration of Dialysis. Further she relates that shedoes not want CPR or any life support measures transcending [employer].     Patient has prior remote labile HTN and ha hx/o labile hypotension attributed to dysautonomia wasas treated initially with Florinef and later for dependent edema was transitioned to Midodrine now maintaining her BP. Patient's BP has been controlled at home.Today's BP is 120/58. Patient denies any cardiac symptoms as chest pain, palpitations, shortness of breath, dizziness or ankle swelling.     Patient's hyperlipidemia is controlled with diet and medications. Patient denies myalgias or other medication SE's. Last lipids were 08/13/2014: Cholesterol 158; HDL 40*; LDL 86; Triglycerides 160 on      Patient has prediabetes since 2012 with A1c 5.8%  and patient denies reactive hypoglycemic symptoms, visual blurring,  diabetic polys or paresthesias. Last A1c was 5.7% on 12/03/2014.       Finally, patient has history of Vitamin D Deficiency  and last vitamin D was 69 on 08/13/2014.   Medication Sig  . acetaminophen  325 MG tablet Take 650 mg by mouth every 6 (six) hours as needed for mild pain or moderate pain.   Marland Kitchen albuterol  HFA inhaler Inhale 1-2 puffs into the lungs every 6 (six) hours as needed for wheezing or shortness of breath (cough).  Marland Kitchen VITAMIN C 250 MG  Chew 250 mg by mouth daily.  . ASTELIN nasal spray Place 2 sprays into both nostrils 2 (two) times daily. Use in each nostril as directed  . buPROPion-XL 300 MG  Take 300 mg by mouth daily.   Marland Kitchen REFRESH PLUS SOLN Place 1 drop into both eyes every 2 (two) hours.  . 4000 UNITS  Take 4,000 Units by mouth daily.  . citalopram  40 MG tablet Take 1 tablet (40 mg total) by mouth daily.  Jaymes Graff ophth soln Place 1 drop into both eyes 4 (four) times daily.  . Ferrous Sulfate 134 MG TABS Take 1 tablet (134 mg total) by mouth every other day.  . gabapentin  300 MG capsule TAKE 1 CAPSULE BY MOUTH AT BEDTIME.  Marland Kitchen IMDUR 30 MG 24 hr tablet TAKE 1 TABLET BY MOUTH DAILY.  Marland Kitchen Levothyroxine 150 MCG tablet TAKE 1 TABLET BY MOUTH ONCE DAILY.  Marland Kitchen loperamide 2 MG capsule Take two tabs po initially, then one  tab after each loose stool: max 8 tabs in 24 hours  . loratadine  10 MG tablet Take 10 mg by mouth daily.  Marland Kitchen LORazepam  0.5 MG tablet Take 1 tablet (0.5 mg total) by mouth every 8 (eight) hours as needed (nausea).  Hale Bogus SUSP Place 1 drop into both eyes 4 (four) times daily.  . midodrine  10 MG tablet Take 1 tablet (10 mg total) by mouth 3 (three) times daily.  Marland Kitchen NITROSTAT 0.4 MG SLt Place 0.4 mg under the tongue every 5 (five) minutes as needed for chest pain. Reported on 03/30/2015  . pantoprazole  40 MG tablet Take 1 tablet daily for Acid reflux & indigestion  . pilocarpine  5 MG tablet TAKE 1 TABLET BY MOUTH 3 TIMES DAILY.  Marland Kitchen senna  8.6 MG tablet Take 1 tablet by  mouth at bedtime.   Allergies  Allergen Reactions  . Codeine Anaphylaxis  . Latex Shortness Of Breath    Adhesive products   . Other Other (See Comments)    Onion, chocolate causes migraines  . Onion Other (See Comments)    Causes migraine headaches  . Zyprexa [Olanzapine] Other (See Comments)    Confusion , dizzy,unsteady  . Adhesive [Tape] Other (See Comments)    blisters  . Hydrocodone Rash    With extreme itching  . Iodinated Diagnostic Agents Itching    Happened 60 years ago  . Sulfa Antibiotics Itching   Past Medical History  Diagnosis Date  . Hyperkalemia   . Hypothyroidism   . COPD (chronic obstructive pulmonary disease) ([place])   . Hyponatremia   . Dizziness   . Fibromyalgia   . Breast cancer ([place])   . Multiple myeloma   . Mucositis   . Hx of echocardiogram     a.  Echocardiogram (12/26/2012): EF 47-09%, grade 1 diastolic dysfunction;   b.  Echocardiogram (02/2013): EF 55-60%, no WMA, trivial effusion  . Fibromyalgia   . Arthritis   . Hx of cardiovascular stress test     LexiScan with low level exercise Myoview (02/2013): No ischemia, EF 72%; normal study  . Complication of anesthesia   . PONV (postoperative nausea and vomiting) 2008    after mastestomy  . Myocardial infarction [facility])     in past, patient was unaware.   . Anginal pain ([place])     used NTG x 2 May 31 and 06/15/13   . GERD (gastroesophageal reflux disease)   . Headache(784.0)   . Anxiety   . Depression   . CKD (chronic kidney disease) stage 3, GFR 30-59 ml/min   . Anemia   . History of blood transfusion     last one May 12   . Neuropathy ([place])   . B12 deficiency 12/04/2014   Health Maintenance  Topic Date Due  . COLONOSCOPY  09/23/1991  . ZOSTAVAX  09/22/2001  . INFLUENZA VACCINE  08/16/2015  . MAMMOGRAM  04/20/2016  . TETANUS/TDAP  10/15/2019  . DEXA SCAN  Completed  . PNA vac Low Risk Adult  Completed   Immunization History  Administered Date(s) Administered  . Influenza, High Dose  Seasonal PF 09/30/2013  . Influenza-Unspecified 11/05/2012, 11/03/2014  . Pneumococcal Conjugate-13 10/05/2013  . Pneumococcal-Unspecified 03/16/2011  . Tdap 10/14/2009   Past Surgical History  Procedure Laterality Date  . History of port removal    . Status post stem cell transplant on September 28, 2008.    Marland Kitchen Abdominal hysterectomy  1981  . Cholecystectomy  1971  .  Mastectomy Left 2008  . Cataract extraction, bilateral    . Breast surgery    . Eye surgery Bilateral     lens implant  . Breast reconstruction    . Portacath placement  12/2012    has had 2  . Av fistula placement Left 06/19/2013    Procedure: CREATION OF LEFT ARM ARTERIOVENOUS (AV) FISTULA ; Angelia Mould, MD   Family History  Problem Relation Age of Onset  . Arthritis Mother   . Asthma Mother   . Cancer Sister   . Hyperlipidemia Brother     Social History   Social History  . Marital Status: Single    Spouse Name: N/A  . Number of Children: 1  . Years of Education: N/A   Occupational History  . retired-sect.Licensed conveyancer)    Social History Main Topics  . Smoking status: Former Smoker -- 1.00 packs/day for 30 years    Types: Cigarettes    Quit date: 02/15/2005  . Smokeless tobacco: Never Used  . Alcohol Use: No  . Drug Use: No  . Sexual Activity: No   ROS Constitutional: Denies fever, chills, weight loss/gain, headaches, insomnia,  night sweats or change in appetite. Does c/o fatigue. Eyes: Denies redness, blurred vision, diplopia, discharge, itchy or watery eyes.  ENT: Denies discharge, congestion, post nasal drip, epistaxis, sore throat, earache, hearing loss, dental pain, Tinnitus, Vertigo, Sinus pain or snoring.  Cardio: Denies chest pain, palpitations, irregular heartbeat, syncope, dyspnea, diaphoresis, orthopnea, PND, claudication or edema Respiratory: denies cough, dyspnea, DOE, pleurisy, hoarseness, laryngitis or wheezing.  Gastrointestinal: Denies dysphagia, heartburn, reflux, water  brash, pain, cramps, nausea, vomiting, bloating, diarrhea, constipation, hematemesis, melena, hematochezia, jaundice or hemorrhoids Genitourinary: Denies dysuria, frequency, urgency, nocturia, hesitancy, discharge, hematuria or flank pain Musculoskeletal: Denies arthralgia, myalgia, stiffness, Jt. Swelling, pain, limp or strain/sprain. Denies Falls. Skin: Denies puritis, rash, hives, warts, acne, eczema or change in skin lesion Neuro: No weakness, tremor, incoordination, spasms, paresthesia or pain Psychiatric: Denies confusion, memory loss or sensory loss. Denies Depression. Endocrine: Denies change in weight, skin, hair change, nocturia, and paresthesia, diabetic polys, visual blurring or hyper / hypo glycemic episodes.  Heme/Lymph: No excessive bleeding, bruising or enlarged lymph nodes.  Physical Exam  BP 120/58 mmHg  Pulse 72  Temp(Src) 97.7 F (36.5 C)  Resp 16  Ht _0  (1.651 m)  Wt 150 lb 3.2 oz (68.13 kg)  BMI 24.99 kg/m2  General Appearance: Well nourished, in no apparent distress. Eyes: PERRLA, EOMs, conjunctiva no swelling or erythema, normal fundi and vessels. Sinuses: No frontal/maxillary tenderness ENT/Mouth: EACs patent / TMs  nl. Nares clear without erythema, swelling, mucoid exudates. Oral hygiene is good. No erythema, swelling, or exudate. Tongue normal, non-obstructing. Tonsils not swollen or erythematous. Hearing normal.  Neck: Supple, thyroid normal. No bruits, nodes or JVD. Respiratory: Respiratory effort normal.  BS equal and clear bilateral without rales, rhonci, wheezing or stridor. Cardio: Heart sounds are normal with regular rate and rhythm and no murmurs, rubs or gallops. Left Brachial AVF with palpable thrill and bruit transmitting to the L shoulder & neck. Otherwise peripheral pulses are normal and equal bilaterally without edema. No aortic or femoral bruits. Breasts: L TRAM reconstruction. No palpable masses.  Chest: symmetric with normal excursions and  percussion.  Abdomen: Soft, with Nl bowel sounds. Nontender, no guarding, rebound, hernias, masses, or organomegaly.  Lymphatics: Non tender without lymphadenopathy.  Musculoskeletal: Full ROM all peripheral extremities, joint stability, 5/5 strength, and normal gait. Skin: Warm and  dry without rashes, lesions, cyanosis, clubbing or  ecchymosis.  Neuro: Cranial nerves intact, reflexes equal bilaterally. Normal muscle tone, no cerebellar symptoms. Sensation intact.  Pysch: Alert and oriented X 3 with normal affect, insight and judgment appropriate.   Assessment and Plan  1. Hypotension,  dysautonomia     Hx/o Hypertension  - [place], RETROPERITNL ABD,  LTD - TSH  2. Mixed hyperlipidemia  - Lipid panel - TSH  3. Prediabetes  - Hemoglobin A1c - Insulin, random  4. Vitamin D deficiency  - VITAMIN D 25 Hydroxy   5. Multiple myeloma not having achieved remission ([place])   6. CKD (chronic kidney disease) stage 5, GFR less than 15 ml/min ([facility])   7. Hypothyroidism  - TSH  8. B12 deficiency  - Vitamin B12 - CBC with Differential/Platelet  9. Screening for rectal cancer  - POC Hemoccult Bld/Stl  10. Medication management  - Urinalysis, Routine w reflex microscopic  - CBC with Differential/Platelet - BASIC METABOLIC PANEL WITH GFR - Hepatic function panel - Magnesium  11. Gastroesophageal reflux disease   12. Anemia, iron deficiency  - Iron and TIBC   Continue prudent diet as discussed, weight control, BP monitoring, regular exercise, and medications as discussed.  Discussed med effects and SE's. Routine screening labs and tests as requested with regular follow-up as recommended. Over 40 minutes of exam, counseling, chart review and high complex critical decision making was performed

## 2015-05-27 NOTE — Patient Instructions (Signed)
Recommend Adult Low Dose Aspirin or   coated  Aspirin 81 mg daily   To reduce risk of Colon Cancer 20 %,   Skin Cancer 26 % ,   Melanoma 46%   and   Pancreatic cancer 60%   ++++++++++++++++++++++++++++++++++++++++++++++++++++++ Vitamin D goal   is between 70-100.   Please make sure that you are taking your Vitamin D as directed.   It is very important as a natural anti-inflammatory   helping hair, skin, and nails, as well as reducing stroke and heart attack risk.   It helps your bones and helps with mood.  It also decreases numerous cancer risks so please take it as directed.   Low Vit D is associated with a 200-300% higher risk for CANCER   and 200-300% higher risk for HEART   ATTACK  &  STROKE.   .....................................Marland Kitchen  It is also associated with higher death rate at younger ages,   autoimmune diseases like Rheumatoid arthritis, Lupus, Multiple Sclerosis.     Also many other serious conditions, like depression, Alzheimer's  Dementia, infertility, muscle aches, fatigue, fibromyalgia - just to name a few.  ++++++++++++++++++++++++++++++++++++++++++++++++  Recommend the book "The END of DIETING" by Dr Excell Seltzer   & the book "The END of DIABETES " by Dr Excell Seltzer  At [email or web address] - get book & Audio CD's     Being diabetic has a  300% increased risk for heart attack, stroke, cancer, and alzheimer- type vascular dementia. It is very important that you work harder with diet by avoiding all foods that are white. Avoid white rice (brown & wild rice is OK), white potatoes (sweetpotatoes in moderation is OK), White bread or wheat bread or anything made out of white flour like bagels, donuts, rolls, buns, biscuits, cakes, pastries, cookies, pizza crust, and pasta (made from white flour & egg whites) - vegetarian pasta or spinach or wheat pasta is OK. Multigrain breads like Arnold's or Pepperidge [employer], or multigrain sandwich thins or flatbreads.  Diet,  exercise and weight loss can reverse and cure diabetes in the early stages.  Diet, exercise and weight loss is very important in the control and prevention of complications of diabetes which affects every system in your body, ie. Brain - dementia/stroke, eyes - glaucoma/blindness, heart - heart attack/heart failure, kidneys - dialysis, stomach - gastric paralysis, intestines - malabsorption, nerves - severe painful neuritis, circulation - gangrene & loss of a leg(s), and finally cancer and Alzheimers.    I recommend avoid fried & greasy foods,  sweets/candy, white rice (brown or wild rice or Quinoa is OK), white potatoes (sweet potatoes are OK) - anything made from white flour - bagels, doughnuts, rolls, buns, biscuits,white and wheat breads, pizza crust and traditional pasta made of white flour & egg white(vegetarian pasta or spinach or wheat pasta is OK).  Multi-grain bread is OK - like multi-grain flat bread or sandwich thins. Avoid alcohol in excess. Exercise is also important.    Eat all the vegetables you want - avoid meat, especially red meat and dairy - especially cheese.  Cheese is the most concentrated form of trans-fats which is the worst thing to clog up our arteries. Veggie cheese is OK which can be found in the fresh produce section at [employer] or [employer] or [employer]  ++++++++++++++++++++++++++++++++++++++++++++++++++ DASH Eating Plan  DASH stands for "Dietary Approaches to Stop Hypertension."   The DASH eating plan is a healthy eating plan that has been shown to reduce high blood  pressure (hypertension). Additional health benefits may include reducing the risk of type 2 diabetes mellitus, heart disease, and stroke. The DASH eating plan may also help with weight loss.  WHAT DO I NEED TO KNOW ABOUT THE DASH EATING PLAN?  For the DASH eating plan, you will follow these general guidelines:  Choose foods with a percent daily value for sodium of less than 5% (as listed on the food  label).  Use salt-free seasonings or herbs instead of table salt or sea salt.  Check with your health care provider or pharmacist before using salt substitutes.  Eat lower-sodium products, often labeled as "lower sodium" or "no salt added."  Eat fresh foods.  Eat more vegetables, fruits, and low-fat dairy products.    Choose whole grains. Look for the word "whole" as the first word in the ingredient list.  Choose fish   Limit sweets, desserts, sugars, and sugary drinks.  Choose heart-healthy fats.  Eat veggie cheese   Eat more home-cooked food and less restaurant, buffet, and fast food.  Limit fried foods.  Huffaker foods using methods other than frying.  Limit canned vegetables. If you do use them, rinse them well to decrease the sodium.  When eating at a restaurant, ask that your food be prepared with less salt, or no salt if possible.                      WHAT FOODS CAN I EAT?  Read Dr Fara Olden Fuhrman's books on The End of Dieting & The End of Diabetes  Grains  Whole grain or whole wheat bread. Brown rice. Whole grain or whole wheat pasta. Quinoa, bulgur, and whole grain cereals. Low-sodium cereals. Corn or whole wheat flour tortillas. Whole grain cornbread. Whole grain crackers. Low-sodium crackers.  Vegetables  Fresh or frozen vegetables (raw, steamed, roasted, or grilled). Low-sodium or reduced-sodium tomato and vegetable juices. Low-sodium or reduced-sodium tomato sauce and paste. Low-sodium or reduced-sodium canned vegetables.   Fruits  All fresh, canned (in natural juice), or frozen fruits.  Protein Products   All fish and seafood.  Dried beans, peas, or lentils. Unsalted nuts and seeds. Unsalted canned beans.  Dairy  Low-fat dairy products, such as skim or 1% milk, 2% or reduced-fat cheeses, low-fat ricotta or cottage cheese, or plain low-fat yogurt. Low-sodium or reduced-sodium cheeses.  Fats and Oils  Tub margarines without trans fats. Light or  reduced-fat mayonnaise and salad dressings (reduced sodium). Avocado. Safflower, olive, or canola oils. Natural peanut or almond butter.  Other  Unsalted popcorn and pretzels. The items listed above may not be a complete list of recommended foods or beverages. Contact your dietitian for more options.  +++++++++++++++++++++++++++++++++++++++++++  WHAT FOODS ARE NOT RECOMMENDED?  Grains/ White flour or wheat flour  White bread. White pasta. White rice. Refined cornbread. Bagels and croissants. Crackers that contain trans fat.  Vegetables  Creamed or fried vegetables. Vegetables in a . Regular canned vegetables. Regular canned tomato sauce and paste. Regular tomato and vegetable juices.  Fruits  Dried fruits. Canned fruit in light or heavy syrup. Fruit juice.  Meat and Other Protein Products  Meat in general - RED mwaet & White meat.  Fatty cuts of meat. Ribs, chicken wings, bacon, sausage, bologna, salami, chitterlings, fatback, hot dogs, bratwurst, and packaged luncheon meats.  Dairy  Whole or 2% milk, cream, half-and-half, and cream cheese. Whole-fat or sweetened yogurt. Full-fat cheeses or blue cheese. Nondairy creamers and whipped toppings. Processed cheese, cheese spreads, or  cheese curds.  Condiments  Onion and garlic salt, seasoned salt, table salt, and sea salt. Canned and packaged gravies. Worcestershire sauce. Tartar sauce. Barbecue sauce. Teriyaki sauce. Soy sauce, including reduced sodium. Steak sauce. Fish sauce. Oyster sauce. Cocktail sauce. Horseradish. Ketchup and mustard. Meat flavorings and tenderizers. Bouillon cubes. Hot sauce. Tabasco sauce. Marinades. Taco seasonings. Relishes.  Fats and Oils Butter, stick margarine, lard, shortening and bacon fat. Coconut, palm kernel, or palm oils. Regular salad dressings.  Pickles and olives. Salted popcorn and pretzels.  The items listed above may not be a complete list of foods and beverages to avoid.   Preventive  Care for Adults  A healthy lifestyle and preventive care can promote health and wellness. Preventive health guidelines for women include the following key practices.  A routine yearly physical is a good way to check with your health care provider about your health and preventive screening. It is a chance to share any concerns and updates on your health and to receive a thorough exam.  Visit your dentist for a routine exam and preventive care every 6 months. Brush your teeth twice a day and floss once a day. Good oral hygiene prevents tooth decay and gum disease.  The frequency of eye exams is based on your age, health, family medical history, use of contact lenses, and other factors. Follow your health care provider's recommendations for frequency of eye exams.  Eat a healthy diet. Foods like vegetables, fruits, whole grains, low-fat dairy products, and lean protein foods contain the nutrients you need without too many calories. Decrease your intake of foods high in solid fats, added sugars, and salt. Eat the right amount of calories for you.Get information about a proper diet from your health care provider, if necessary.  Regular physical exercise is one of the most important things you can do for your health. Most adults should get at least 150 minutes of moderate-intensity exercise (any activity that increases your heart rate and causes you to sweat) each week. In addition, most adults need muscle-strengthening exercises on 2 or more days a week.  Maintain a healthy weight. The body mass index (BMI) is a screening tool to identify possible weight problems. It provides an estimate of body fat based on height and weight. Your health care provider can find your BMI and can help you achieve or maintain a healthy weight.For adults 20 years and older:  A BMI below 18.5 is considered underweight.  A BMI of 18.5 to 24.9 is normal.  A BMI of 25 to 29.9 is considered overweight.  A BMI of 30 and  above is considered obese.  Maintain normal blood lipids and cholesterol levels by exercising and minimizing your intake of saturated fat. Eat a balanced diet with plenty of fruit and vegetables. If your lipid or cholesterol levels are high, you are over 50, or you are at high risk for heart disease, you may need your cholesterol levels checked more frequently.Ongoing high lipid and cholesterol levels should be treated with medicines if diet and exercise are not working.  If you smoke, find out from your health care provider how to quit. If you do not use tobacco, do not start.  Lung cancer screening is recommended for adults aged 56-80 years who are at high risk for developing lung cancer because of a history of smoking. A yearly low-dose CT scan of the lungs is recommended for people who have at least a 30-pack-year history of smoking and are a current smoker or  have quit within the past 15 years. A pack year of smoking is smoking an average of 1 pack of cigarettes a day for 1 year (for example: 1 pack a day for 30 years or 2 packs a day for 15 years). Yearly screening should continue until the smoker has stopped smoking for at least 15 years. Yearly screening should be stopped for people who develop a health problem that would prevent them from having lung cancer treatment.  Avoid use of street drugs. Do not share needles with anyone. Ask for help if you need support or instructions about stopping the use of drugs.  High blood pressure causes heart disease and increases the risk of stroke.  Ongoing high blood pressure should be treated with medicines if weight loss and exercise do not work.  If you are 52-67 years old, ask your health care provider if you should take aspirin to prevent strokes.  Diabetes screening involves taking a blood sample to check your fasting blood sugar level. This should be done once every 3 years, after age 9, if you are within normal weight and without risk factors for  diabetes. Testing should be considered at a younger age or be carried out more frequently if you are overweight and have at least 1 risk factor for diabetes.  Breast cancer screening is essential preventive care for women. You should practice "breast self-awareness." This means understanding the normal appearance and feel of your breasts and may include breast self-examination. Any changes detected, no matter how small, should be reported to a health care provider. Women in their 45s and 30s should have a clinical breast exam (CBE) by a health care provider as part of a regular health exam every 1 to 3 years. After age 76, women should have a CBE every year. Starting at age 54, women should consider having a mammogram (breast X-ray test) every year. Women who have a family history of breast cancer should talk to their health care provider about genetic screening. Women at a high risk of breast cancer should talk to their health care providers about having an MRI and a mammogram every year.  Breast cancer gene (BRCA)-related cancer risk assessment is recommended for women who have family members with BRCA-related cancers. BRCA-related cancers include breast, ovarian, tubal, and peritoneal cancers. Having family members with these cancers may be associated with an increased risk for harmful changes (mutations) in the breast cancer genes BRCA1 and BRCA2. Results of the assessment will determine the need for genetic counseling and BRCA1 and BRCA2 testing.  Routine pelvic exams to screen for cancer are no longer recommended for nonpregnant women who are considered low risk for cancer of the pelvic organs (ovaries, uterus, and vagina) and who do not have symptoms. Ask your health care provider if a screening pelvic exam is right for you.  If you have had past treatment for cervical cancer or a condition that could lead to cancer, you need Pap tests and screening for cancer for at least 20 years after your  treatment. If Pap tests have been discontinued, your risk factors (such as having a new sexual partner) need to be reassessed to determine if screening should be resumed. Some women have medical problems that increase the chance of getting cervical cancer. In these cases, your health care provider may recommend more frequent screening and Pap tests.    Colorectal cancer can be detected and often prevented. Most routine colorectal cancer screening begins at the age of 73 years and  continues through age 75 years. However, your health care provider may recommend screening at an earlier age if you have risk factors for colon cancer. On a yearly basis, your health care provider may provide home test kits to check for hidden blood in the stool. Use of a small camera at the end of a tube, to directly examine the colon (sigmoidoscopy or colonoscopy), can detect the earliest forms of colorectal cancer. Talk to your health care provider about this at age 50, when routine screening begins. Direct exam of the colon should be repeated every 5-10 years through age 75 years, unless early forms of pre-cancerous polyps or small growths are found.  Osteoporosis is a disease in which the bones lose minerals and strength with aging. This can result in serious bone fractures or breaks. The risk of osteoporosis can be identified using a bone density scan. Women ages 65 years and over and women at risk for fractures or osteoporosis should discuss screening with their health care providers. Ask your health care provider whether you should take a calcium supplement or vitamin D to reduce the rate of osteoporosis.  Menopause can be associated with physical symptoms and risks. Hormone replacement therapy is available to decrease symptoms and risks. You should talk to your health care provider about whether hormone replacement therapy is right for you.  Use sunscreen. Apply sunscreen liberally and repeatedly throughout the day. You  should seek shade when your shadow is shorter than you. Protect yourself by wearing long sleeves, pants, a wide-brimmed hat, and sunglasses year round, whenever you are outdoors.  Once a month, do a whole body skin exam, using a mirror to look at the skin on your back. Tell your health care provider of new moles, moles that have irregular borders, moles that are larger than a pencil eraser, or moles that have changed in shape or color.  Stay current with required vaccines (immunizations).  Influenza vaccine. All adults should be immunized every year.  Tetanus, diphtheria, and acellular pertussis (Td, Tdap) vaccine. Pregnant women should receive 1 dose of Tdap vaccine during each pregnancy. The dose should be obtained regardless of the length of time since the last dose. Immunization is preferred during the 27th-36th week of gestation. An adult who has not previously received Tdap or who does not know her vaccine status should receive 1 dose of Tdap. This initial dose should be followed by tetanus and diphtheria toxoids (Td) booster doses every 10 years. Adults with an unknown or incomplete history of completing a 3-dose immunization series with Td-containing vaccines should begin or complete a primary immunization series including a Tdap dose. Adults should receive a Td booster every 10 years.    Zoster vaccine. One dose is recommended for adults aged 60 years or older unless certain conditions are present.    Pneumococcal 13-valent conjugate (PCV13) vaccine. When indicated, a person who is uncertain of her immunization history and has no record of immunization should receive the PCV13 vaccine. An adult aged 19 years or older who has certain medical conditions and has not been previously immunized should receive 1 dose of PCV13 vaccine. This PCV13 should be followed with a dose of pneumococcal polysaccharide (PPSV23) vaccine. The PPSV23 vaccine dose should be obtained at least 8 weeks after the dose  of PCV13 vaccine. An adult aged 19 years or older who has certain medical conditions and previously received 1 or more doses of PPSV23 vaccine should receive 1 dose of PCV13. The PCV13 vaccine dose should   be obtained 1 or more years after the last PPSV23 vaccine dose.    Pneumococcal polysaccharide (PPSV23) vaccine. When PCV13 is also indicated, PCV13 should be obtained first. All adults aged 65 years and older should be immunized. An adult younger than age 65 years who has certain medical conditions should be immunized. Any person who resides in a nursing home or long-term care facility should be immunized. An adult smoker should be immunized. People with an immunocompromised condition and certain other conditions should receive both PCV13 and PPSV23 vaccines. People with human immunodeficiency virus (HIV) infection should be immunized as soon as possible after diagnosis. Immunization during chemotherapy or radiation therapy should be avoided. Routine use of PPSV23 vaccine is not recommended for American Indians, [place] Natives, or people younger than 65 years unless there are medical conditions that require PPSV23 vaccine. When indicated, people who have unknown immunization and have no record of immunization should receive PPSV23 vaccine. One-time revaccination 5 years after the first dose of PPSV23 is recommended for people aged 19-64 years who have chronic kidney failure, nephrotic syndrome, asplenia, or immunocompromised conditions. People who received 1-2 doses of PPSV23 before age 65 years should receive another dose of PPSV23 vaccine at age 65 years or later if at least 5 years have passed since the previous dose. Doses of PPSV23 are not needed for people immunized with PPSV23 at or after age 65 years.   Preventive Services / Frequency  Ages 65 years and over  Blood pressure check.  Lipid and cholesterol check.  Lung cancer screening. / Every year if you are aged 55-80 years and have a  30-pack-year history of smoking and currently smoke or have quit within the past 15 years. Yearly screening is stopped once you have quit smoking for at least 15 years or develop a health problem that would prevent you from having lung cancer treatment.  Clinical breast exam.** / Every year after age 40 years.  BRCA-related cancer risk assessment.** / For women who have family members with a BRCA-related cancer (breast, ovarian, tubal, or peritoneal cancers).  Mammogram.** / Every year beginning at age 40 years and continuing for as long as you are in good health. Consult with your health care provider.  Pap test.** / Every 3 years starting at age 30 years through age 65 or 70 years with 3 consecutive normal Pap tests. Testing can be stopped between 65 and 70 years with 3 consecutive normal Pap tests and no abnormal Pap or HPV tests in the past 10 years.  Fecal occult blood test (FOBT) of stool. / Every year beginning at age 50 years and continuing until age 75 years. You may not need to do this test if you get a colonoscopy every 10 years.  Flexible sigmoidoscopy or colonoscopy.** / Every 5 years for a flexible sigmoidoscopy or every 10 years for a colonoscopy beginning at age 50 years and continuing until age 75 years.  Hepatitis C blood test.** / For all people born from 1945 through 1965 and any individual with known risks for hepatitis C.  Osteoporosis screening.** / A one-time screening for women ages 65 years and over and women at risk for fractures or osteoporosis.  Skin self-exam. / Monthly.  Influenza vaccine. / Every year.  Tetanus, diphtheria, and acellular pertussis (Tdap/Td) vaccine.** / 1 dose of Td every 10 years.  Zoster vaccine.** / 1 dose for adults aged 60 years or older.  Pneumococcal 13-valent conjugate (PCV13) vaccine.** / Consult your health care provider.    Pneumococcal polysaccharide (PPSV23) vaccine.** / 1 dose for all adults aged 68 years and older. Screening  for abdominal aortic aneurysm (AAA)  by ultrasound is recommended for people who have history of high blood pressure or who are current or former smokers.

## 2015-05-28 LAB — URINALYSIS, ROUTINE W REFLEX MICROSCOPIC
BILIRUBIN URINE: NEGATIVE
KETONES UR: NEGATIVE
Leukocytes, UA: NEGATIVE
NITRITE: NEGATIVE
SPECIFIC GRAVITY, URINE: 1.02 (ref 1.001–1.035)
pH: 6.5 (ref 5.0–8.0)

## 2015-05-28 LAB — VITAMIN D 25 HYDROXY (VIT D DEFICIENCY, FRACTURES): VIT D 25 HYDROXY: 61 ng/mL (ref 30–100)

## 2015-05-28 LAB — URINALYSIS, MICROSCOPIC ONLY
CRYSTALS: NONE SEEN [HPF]
RBC / HPF: NONE SEEN RBC/HPF (ref <=2)
Yeast: NONE SEEN [HPF]

## 2015-05-28 LAB — INSULIN, RANDOM: Insulin: 25.6 u[IU]/mL — ABNORMAL HIGH (ref 2.0–19.6)

## 2015-06-04 NOTE — Addendum Note (Signed)
Addended by: Jennfier Abdulla A on: 06/04/2015 12:09 PM   Modules accepted: Orders

## 2015-06-07 ENCOUNTER — Encounter: Payer: Self-pay | Admitting: Internal Medicine

## 2015-06-14 DIAGNOSIS — M47816 Spondylosis without myelopathy or radiculopathy, lumbar region: Secondary | ICD-10-CM | POA: Diagnosis not present

## 2015-06-14 DIAGNOSIS — M545 Low back pain: Secondary | ICD-10-CM | POA: Diagnosis not present

## 2015-06-14 DIAGNOSIS — M419 Scoliosis, unspecified: Secondary | ICD-10-CM | POA: Diagnosis not present

## 2015-06-17 ENCOUNTER — Other Ambulatory Visit: Payer: Self-pay | Admitting: Internal Medicine

## 2015-06-17 ENCOUNTER — Other Ambulatory Visit: Payer: Self-pay | Admitting: Cardiovascular Disease

## 2015-06-20 DIAGNOSIS — D630 Anemia in neoplastic disease: Secondary | ICD-10-CM | POA: Diagnosis not present

## 2015-06-20 DIAGNOSIS — N184 Chronic kidney disease, stage 4 (severe): Secondary | ICD-10-CM | POA: Diagnosis not present

## 2015-06-20 DIAGNOSIS — N390 Urinary tract infection, site not specified: Secondary | ICD-10-CM | POA: Diagnosis not present

## 2015-06-24 ENCOUNTER — Telehealth: Payer: Self-pay | Admitting: Internal Medicine

## 2015-06-24 DIAGNOSIS — I770 Arteriovenous fistula, acquired: Secondary | ICD-10-CM | POA: Diagnosis not present

## 2015-06-24 DIAGNOSIS — D630 Anemia in neoplastic disease: Secondary | ICD-10-CM | POA: Diagnosis not present

## 2015-06-24 DIAGNOSIS — C9000 Multiple myeloma not having achieved remission: Secondary | ICD-10-CM | POA: Diagnosis not present

## 2015-06-24 DIAGNOSIS — R809 Proteinuria, unspecified: Secondary | ICD-10-CM | POA: Diagnosis not present

## 2015-06-24 DIAGNOSIS — N184 Chronic kidney disease, stage 4 (severe): Secondary | ICD-10-CM | POA: Diagnosis not present

## 2015-06-24 DIAGNOSIS — I5030 Unspecified diastolic (congestive) heart failure: Secondary | ICD-10-CM | POA: Diagnosis not present

## 2015-06-24 DIAGNOSIS — I951 Orthostatic hypotension: Secondary | ICD-10-CM | POA: Diagnosis not present

## 2015-06-24 DIAGNOSIS — R079 Chest pain, unspecified: Secondary | ICD-10-CM | POA: Diagnosis not present

## 2015-06-24 DIAGNOSIS — N2581 Secondary hyperparathyroidism of renal origin: Secondary | ICD-10-CM | POA: Diagnosis not present

## 2015-06-24 NOTE — Telephone Encounter (Signed)
returned call and lvm for pt confirming appts

## 2015-06-27 DIAGNOSIS — M545 Low back pain: Secondary | ICD-10-CM | POA: Diagnosis not present

## 2015-06-27 DIAGNOSIS — M47816 Spondylosis without myelopathy or radiculopathy, lumbar region: Secondary | ICD-10-CM | POA: Diagnosis not present

## 2015-06-29 ENCOUNTER — Other Ambulatory Visit: Payer: Self-pay | Admitting: *Deleted

## 2015-06-29 DIAGNOSIS — C9000 Multiple myeloma not having achieved remission: Secondary | ICD-10-CM

## 2015-06-30 ENCOUNTER — Other Ambulatory Visit (HOSPITAL_BASED_OUTPATIENT_CLINIC_OR_DEPARTMENT_OTHER): Payer: Medicare Other

## 2015-06-30 DIAGNOSIS — C9000 Multiple myeloma not having achieved remission: Secondary | ICD-10-CM | POA: Diagnosis not present

## 2015-06-30 LAB — CBC WITH DIFFERENTIAL/PLATELET
BASO%: 0.4 % (ref 0.0–2.0)
Basophils Absolute: 0 10*3/uL (ref 0.0–0.1)
EOS%: 1.9 % (ref 0.0–7.0)
Eosinophils Absolute: 0.1 10*3/uL (ref 0.0–0.5)
HCT: 28.8 % — ABNORMAL LOW (ref 34.8–46.6)
HGB: 9.3 g/dL — ABNORMAL LOW (ref 11.6–15.9)
LYMPH%: 12.9 % — AB (ref 14.0–49.7)
MCH: 33.3 pg (ref 25.1–34.0)
MCHC: 32.3 g/dL (ref 31.5–36.0)
MCV: 103.2 fL — AB (ref 79.5–101.0)
MONO#: 1 10*3/uL — ABNORMAL HIGH (ref 0.1–0.9)
MONO%: 12.9 % (ref 0.0–14.0)
NEUT%: 71.9 % (ref 38.4–76.8)
NEUTROS ABS: 5.8 10*3/uL (ref 1.5–6.5)
Platelets: 186 10*3/uL (ref 145–400)
RBC: 2.79 10*6/uL — AB (ref 3.70–5.45)
RDW: 15.6 % — ABNORMAL HIGH (ref 11.2–14.5)
WBC: 8 10*3/uL (ref 3.9–10.3)
lymph#: 1 10*3/uL (ref 0.9–3.3)

## 2015-06-30 LAB — COMPREHENSIVE METABOLIC PANEL
ALT: 12 U/L (ref 0–55)
AST: 12 U/L (ref 5–34)
Albumin: 3.7 g/dL (ref 3.5–5.0)
Alkaline Phosphatase: 113 U/L (ref 40–150)
Anion Gap: 10 mEq/L (ref 3–11)
BUN: 38.9 mg/dL — AB (ref 7.0–26.0)
CHLORIDE: 113 meq/L — AB (ref 98–109)
CO2: 19 meq/L — AB (ref 22–29)
CREATININE: 3.7 mg/dL — AB (ref 0.6–1.1)
Calcium: 9.5 mg/dL (ref 8.4–10.4)
EGFR: 11 mL/min/{1.73_m2} — AB (ref >90)
Glucose: 105 mg/dl (ref 70–140)
Potassium: 3.5 mEq/L (ref 3.5–5.1)
SODIUM: 142 meq/L (ref 136–145)
TOTAL PROTEIN: 6.8 g/dL (ref 6.4–8.3)
Total Bilirubin: 0.47 mg/dL (ref 0.20–1.20)

## 2015-06-30 LAB — LACTATE DEHYDROGENASE: LDH: 136 U/L (ref 125–245)

## 2015-07-01 LAB — IGG, IGA, IGM
IGM (IMMUNOGLOBIN M), SRM: 9 mg/dL — AB (ref 26–217)
IgA, Qn, Serum: 93 mg/dL (ref 64–422)
IgG, Qn, Serum: 458 mg/dL — ABNORMAL LOW (ref 700–1600)

## 2015-07-01 LAB — KAPPA/LAMBDA LIGHT CHAINS
Ig Kappa Free Light Chain: 165.8 mg/L — ABNORMAL HIGH (ref 3.3–19.4)
Ig Lambda Free Light Chain: 19 mg/L (ref 5.7–26.3)
KAPPA/LAMBDA FLC RATIO: 8.73 — AB (ref 0.26–1.65)

## 2015-07-01 LAB — BETA 2 MICROGLOBULIN, SERUM: Beta-2: 9.1 mg/L — ABNORMAL HIGH (ref 0.6–2.4)

## 2015-07-07 ENCOUNTER — Encounter: Payer: Self-pay | Admitting: Internal Medicine

## 2015-07-07 ENCOUNTER — Ambulatory Visit (HOSPITAL_BASED_OUTPATIENT_CLINIC_OR_DEPARTMENT_OTHER): Payer: Medicare Other | Admitting: Internal Medicine

## 2015-07-07 ENCOUNTER — Telehealth: Payer: Self-pay | Admitting: Internal Medicine

## 2015-07-07 ENCOUNTER — Telehealth: Payer: Self-pay | Admitting: *Deleted

## 2015-07-07 ENCOUNTER — Other Ambulatory Visit: Payer: Medicare Other

## 2015-07-07 VITALS — BP 126/54 | HR 70 | Temp 98.6°F | Resp 18 | Ht 65.0 in | Wt 150.3 lb

## 2015-07-07 DIAGNOSIS — R530 Neoplastic (malignant) related fatigue: Secondary | ICD-10-CM

## 2015-07-07 DIAGNOSIS — M25551 Pain in right hip: Secondary | ICD-10-CM

## 2015-07-07 DIAGNOSIS — C9000 Multiple myeloma not having achieved remission: Secondary | ICD-10-CM

## 2015-07-07 DIAGNOSIS — R102 Pelvic and perineal pain: Secondary | ICD-10-CM | POA: Diagnosis not present

## 2015-07-07 DIAGNOSIS — M25559 Pain in unspecified hip: Secondary | ICD-10-CM

## 2015-07-07 DIAGNOSIS — N189 Chronic kidney disease, unspecified: Secondary | ICD-10-CM | POA: Diagnosis not present

## 2015-07-07 HISTORY — DX: Pain in unspecified hip: M25.559

## 2015-07-07 NOTE — Progress Notes (Signed)
[place] [phone]   [phone]  OFFICE PROGRESS NOTE  [name], MD [address] [place] [address]  PRINCIPAL DIAGNOSES:  1. Recurrent multiple myeloma initially diagnosed in 2002, initially with smoldering myeloma at [facility].  2. Ductal carcinoma in situ status post mastectomy with sentinel lymph node biopsy in October 2008.  PRIOR THERAPY:  1. Status post treatment with tamoxifen from November 2008 through February 2009, discontinued secondary to intolerance. 2. Status post 3 cycles of chemotherapy with Revlimid and Decadron followed by 1 cycle of Decadron only with mild response. 3. Status post 2 cycles of systemic chemotherapy with Velcade, Doxil and Decadron discontinued secondary to significant peripheral neuropathy. Last dose was given May 2010 at [facility]. 4. Status post autologous peripheral blood stem cell transplant on October 01, 2008 at [facility] under the care of Dr. Phyllis Ginger.  5. Systemic chemotherapy with Carfilzomib initially was 20 mg/M2 and will be increased after cycle #1 to 36 mg/M2 on days 1, 2, 8, 9, 15 and 16 every 4 weeks in addition to Cytoxan 300 mg/M2 and Decadron 40 mg by mouth weekly basis, status post 4 cycles. First cycle on 12/29/2012. 6. She resumed chemotherapy with Carfilzomib, Cytoxan, and Decadron on 05/03/2014. Status post 6 cycles.  CURRENT THERAPY: The patient will resume her systemic chemotherapy again with Carfilzomib, Cytoxan and Decadron on 07/11/2015.  INTERVAL HISTORY: Nancy Norris 75 y.o. female returns to the clinic today for follow-up visit accompanied by her daughter. The patient is feeling fine today with no specific complaints except for pain on the right pelvic area. She is worried about blood clot in that area or other inflammatory process. She has end-stage renal disease but she is not interested in hemodialysis.  She denied having any significant chest pain, shortness of breath, cough or hemoptysis. She denied having any bleeding issues. The patient denied having any significant nausea or vomiting. She has no fever or chills. She has no significant weight loss or night sweats. She had repeat myeloma panel performed recently and she is here for evaluation and discussion of her lab results.  MEDICAL HISTORY: Past Medical History  Diagnosis Date  . Hyperkalemia   . Hypothyroidism   . COPD (chronic obstructive pulmonary disease) ([place])   . Hyponatremia   . Dizziness   . Fibromyalgia   . Breast cancer ([place])   . Multiple myeloma   . Mucositis   . Hx of echocardiogram     a.  Echocardiogram (12/26/2012): EF 53-74%, grade 1 diastolic dysfunction;   b.  Echocardiogram (02/2013): EF 55-60%, no WMA, trivial effusion  . Fibromyalgia   . Arthritis   . Hx of cardiovascular stress test     LexiScan with low level exercise Myoview (02/2013): No ischemia, EF 72%; normal study  . Complication of anesthesia   . PONV (postoperative nausea and vomiting) 2008    after mastestomy  . Myocardial infarction [facility])     in past, patient was unaware.   . Anginal pain ([place])     used NTG x 2 May 31 and 06/15/13   . GERD (gastroesophageal reflux disease)   . Headache(784.0)   . Anxiety   . Depression   . CKD (chronic kidney disease) stage 3, GFR 30-59 ml/min   . Anemia   . History of blood transfusion     last one May 12   . Neuropathy ([place])   . B12  deficiency 12/04/2014    ALLERGIES:  is allergic to codeine; latex; other; onion; zyprexa; adhesive; hydrocodone; iodinated diagnostic agents; and sulfa antibiotics.  MEDICATIONS:  Current Outpatient Prescriptions  Medication Sig Dispense Refill  . acetaminophen (TYLENOL) 325 MG tablet Take 650 mg by mouth every 6 (six) hours as needed for mild pain or moderate pain.     Marland Kitchen albuterol (PROVENTIL HFA;VENTOLIN HFA) 108 (90 Base) MCG/ACT inhaler Inhale 1-2 puffs into the lungs  every 6 (six) hours as needed for wheezing or shortness of breath (cough). 1 Inhaler 0  . ANORO ELLIPTA 62.5-25 MCG/INH AEPB   0  . ascorbic acid (VITAMIN C) 250 MG CHEW Chew 250 mg by mouth daily.    Marland Kitchen azelastine (ASTELIN) 0.1 % nasal spray Place 2 sprays into both nostrils 2 (two) times daily. Use in each nostril as directed 30 mL 2  . buPROPion (WELLBUTRIN XL) 300 MG 24 hr tablet Take 300 mg by mouth daily.   98  . carboxymethylcellulose (REFRESH PLUS) 0.5 % SOLN Place 1 drop into both eyes every 2 (two) hours.    . Cholecalciferol 4000 UNITS TABS Take 4,000 Units by mouth daily.    . citalopram (CELEXA) 40 MG tablet Take 1 tablet (40 mg total) by mouth daily. 30 tablet 3  . cromolyn (OPTICROM) 4 % ophthalmic solution Place 1 drop into both eyes 4 (four) times daily.    . Ferrous Sulfate 134 MG TABS Take 1 tablet (134 mg total) by mouth every other day. 60 tablet 2  . gabapentin (NEURONTIN) 300 MG capsule TAKE 1 CAPSULE BY MOUTH AT BEDTIME. 90 capsule 3  . isosorbide mononitrate (IMDUR) 30 MG 24 hr tablet TAKE 1 TABLET BY MOUTH DAILY. 30 tablet 8  . levothyroxine (SYNTHROID, LEVOTHROID) 150 MCG tablet TAKE 1 TABLET BY MOUTH ONCE DAILY. 90 tablet 1  . loperamide (IMODIUM) 2 MG capsule Take two tabs po initially, then one tab after each loose stool: max 8 tabs in 24 hours 30 capsule 0  . loratadine (CLARITIN) 10 MG tablet Take 10 mg by mouth daily.    Marland Kitchen LORazepam (ATIVAN) 0.5 MG tablet Take 1 tablet (0.5 mg total) by mouth every 8 (eight) hours as needed (nausea). 30 tablet 0  . loteprednol (ALREX) 0.2 % SUSP Place 1 drop into both eyes 4 (four) times daily. 1 Bottle PRN  . midodrine (PROAMATINE) 10 MG tablet TAKE 1 TABLET BY MOUTH 3 TIMES DAILY. 90 tablet 11  . nitroGLYCERIN (NITROSTAT) 0.4 MG SL tablet Place 0.4 mg under the tongue every 5 (five) minutes as needed for chest pain. Reported on 03/30/2015    . pantoprazole (PROTONIX) 40 MG tablet Take 1 tablet daily for Acid reflux & indigestion 90  tablet 1  . pilocarpine (SALAGEN) 5 MG tablet TAKE 1 TABLET BY MOUTH 3 TIMES DAILY. 90 tablet 1  . ranitidine (ZANTAC) 300 MG tablet Take 1 to 2 tablets daily for heartburn & reflux to allow wean and transition from PPI / pantoprazole 180 tablet 99  . senna (SENOKOT) 8.6 MG tablet Take 1 tablet by mouth at bedtime.     No current facility-administered medications for this visit.   Facility-Administered Medications Ordered in Other Visits  Medication Dose Route Frequency Provider Last Rate Last Dose  . 0.9 %  sodium chloride infusion   Intravenous Once Adrena E Johnson, PA-C      . sodium chloride 0.9 % injection 10 mL  10 mL Intracatheter PRN Curt Bears, MD   10  mL at 01/05/13 1825  . sodium chloride 0.9 % injection 10 mL  10 mL Intracatheter PRN Curt Bears, MD   10 mL at 06/15/14 1514    SURGICAL HISTORY:  Past Surgical History  Procedure Laterality Date  . History of port removal    . Status post stem cell transplant on September 28, 2008.    Marland Kitchen Abdominal hysterectomy  1981  . Cholecystectomy  1971  . Mastectomy Left 2008  . Cataract extraction, bilateral    . Breast surgery    . Eye surgery Bilateral     lens implant  . Breast reconstruction    . Portacath placement  12/2012    has had 2  . Av fistula placement Left 06/19/2013    Procedure: CREATION OF LEFT ARM ARTERIOVENOUS (AV) FISTULA ;  Surgeon: Angelia Mould, MD;  Location: [facility] OR;  Service: Vascular;  Laterality: Left;    REVIEW OF SYSTEMS:  Constitutional: negative Eyes: negative Ears, nose, mouth, throat, and face: negative Respiratory: negative Cardiovascular: negative Gastrointestinal: negative Genitourinary:negative, Right pelvic pain Integument/breast: negative Hematologic/lymphatic: negative Musculoskeletal:negative Neurological: negative Behavioral/Psych: negative Endocrine: negative Allergic/Immunologic: negative   PHYSICAL EXAMINATION: General appearance: alert, cooperative and no  distress Head: Normocephalic, without obvious abnormality, atraumatic Neck: no adenopathy, no JVD, supple, symmetrical, trachea midline and thyroid not enlarged, symmetric, no tenderness/mass/nodules Lymph nodes: Cervical, supraclavicular, and axillary nodes normal. Resp: clear to auscultation bilaterally Back: symmetric, no curvature. ROM normal. No CVA tenderness. Cardio: regular rate and rhythm, S1, S2 normal, no murmur, click, rub or gallop GI: soft, non-tender; bowel sounds normal; no masses,  no organomegaly Extremities: extremities normal, atraumatic, no cyanosis or edema Neurologic: Alert and oriented X 3, normal strength and tone. Normal symmetric reflexes. Normal coordination and gait  ECOG PERFORMANCE STATUS: 1 - Symptomatic but completely ambulatory  Blood pressure 126/54, pulse 70, temperature 98.6 F (37 C), temperature source Oral, resp. rate 18, height _0  (1.651 m), weight 150 lb 4.8 oz (68.176 kg), SpO2 98 %.  LABORATORY DATA: Lab Results  Component Value Date   WBC 8.0 06/30/2015   HGB 9.3* 06/30/2015   HCT 28.8* 06/30/2015   MCV 103.2* 06/30/2015   PLT 186 06/30/2015      Chemistry      Component Value Date/Time   NA 142 06/30/2015 1021   NA 142 05/27/2015 1139   K 3.5 06/30/2015 1021   K 3.7 05/27/2015 1139   CL 113* 05/27/2015 1139   CL 105 03/19/2012 0811   CO2 19* 06/30/2015 1021   CO2 18* 05/27/2015 1139   BUN 38.9* 06/30/2015 1021   BUN 34* 05/27/2015 1139   CREATININE 3.7* 06/30/2015 1021   CREATININE 3.69* 05/27/2015 1139   CREATININE 2.74* 12/07/2014 0515      Component Value Date/Time   CALCIUM 9.5 06/30/2015 1021   CALCIUM 9.1 05/27/2015 1139   ALKPHOS 113 06/30/2015 1021   ALKPHOS 110 05/27/2015 1139   AST 12 06/30/2015 1021   AST 18 05/27/2015 1139   ALT 12 06/30/2015 1021   ALT 19 05/27/2015 1139   BILITOT 0.47 06/30/2015 1021   BILITOT 0.7 05/27/2015 1139     RADIOGRAPHIC STUDIES: No results found.  ASSESSMENT AND PLAN:  This is a very pleasant 75 years old white female with history of multiple myeloma as well as chronic kidney disease status post several chemotherapy regimen and most recently treated with Carfilzomib, Cytoxan and Decadron status post 5 cycles. The patient has been tolerating her treatment well except  for the fatigue secondary to end-stage renal disease as well as anemia of chronic disease. The patient is currently on observation and feeling fine. Unfortunately the recent myeloma panel showed evidence for disease progression with increase in the free kappa light chain and worsening of her renal function as well as anemia. I discussed the lab result with the patient and her daughter. I recommended for her to resume her systemic chemotherapy with Carfilzomib, Cytoxan and Decadron. She is expected to start the first dose of the treatment on 07/11/2015. For the right pelvic pain, I will order CT scan of the pelvis to rule out any blood clots or other inflammatory process. For the chronic kidney disease, the patient will continue her routine follow-up visit with her nephrologist. The patient would come back for follow-up visit in one month for evaluation and management of any adverse effect of her treatment before starting cycle #2. The patient was advised to call immediately if she has any concerning symptoms in the interval. The patient voices understanding of current disease status and treatment options and is in agreement with the current care plan.  All questions were answered. The patient knows to call the clinic with any problems, questions or concerns. We can certainly see the patient much sooner if necessary.  Disclaimer: This note was dictated with voice recognition software. Similar sounding words can inadvertently be transcribed and may not be corrected upon review.

## 2015-07-07 NOTE — Telephone Encounter (Signed)
Gave pt cal & avs °

## 2015-07-07 NOTE — Telephone Encounter (Signed)
Per staff message and POF I have scheduled appts. Advised scheduler of appts. JMW  

## 2015-07-08 ENCOUNTER — Telehealth: Payer: Self-pay | Admitting: Medical Oncology

## 2015-07-08 ENCOUNTER — Other Ambulatory Visit: Payer: Self-pay | Admitting: Medical Oncology

## 2015-07-08 DIAGNOSIS — C9000 Multiple myeloma not having achieved remission: Secondary | ICD-10-CM

## 2015-07-08 NOTE — Telephone Encounter (Signed)
Pt cannot drink oral contrast . I sent new order to [facility] for w/o contrast . I called central scheduling to call pt with appt.

## 2015-07-10 ENCOUNTER — Emergency Department (HOSPITAL_COMMUNITY): Payer: Medicare Other

## 2015-07-10 ENCOUNTER — Encounter (HOSPITAL_COMMUNITY): Payer: Self-pay | Admitting: *Deleted

## 2015-07-10 ENCOUNTER — Emergency Department (HOSPITAL_COMMUNITY)
Admission: EM | Admit: 2015-07-10 | Discharge: 2015-07-10 | Disposition: A | Discharge status: Home / Self Care | Payer: Medicare Other | Attending: Emergency Medicine | Admitting: Emergency Medicine

## 2015-07-10 ENCOUNTER — Encounter: Payer: Self-pay | Admitting: Internal Medicine

## 2015-07-10 DIAGNOSIS — I252 Old myocardial infarction: Secondary | ICD-10-CM | POA: Insufficient documentation

## 2015-07-10 DIAGNOSIS — F329 Major depressive disorder, single episode, unspecified: Secondary | ICD-10-CM | POA: Diagnosis not present

## 2015-07-10 DIAGNOSIS — E039 Hypothyroidism, unspecified: Secondary | ICD-10-CM | POA: Diagnosis not present

## 2015-07-10 DIAGNOSIS — X58XXXA Exposure to other specified factors, initial encounter: Secondary | ICD-10-CM | POA: Diagnosis not present

## 2015-07-10 DIAGNOSIS — Y929 Unspecified place or not applicable: Secondary | ICD-10-CM | POA: Diagnosis not present

## 2015-07-10 DIAGNOSIS — Y939 Activity, unspecified: Secondary | ICD-10-CM | POA: Diagnosis not present

## 2015-07-10 DIAGNOSIS — Z87891 Personal history of nicotine dependence: Secondary | ICD-10-CM | POA: Diagnosis not present

## 2015-07-10 DIAGNOSIS — N183 Chronic kidney disease, stage 3 (moderate): Secondary | ICD-10-CM | POA: Diagnosis not present

## 2015-07-10 DIAGNOSIS — J449 Chronic obstructive pulmonary disease, unspecified: Secondary | ICD-10-CM | POA: Insufficient documentation

## 2015-07-10 DIAGNOSIS — R1031 Right lower quadrant pain: Secondary | ICD-10-CM | POA: Diagnosis not present

## 2015-07-10 DIAGNOSIS — S76911A Strain of unspecified muscles, fascia and tendons at thigh level, right thigh, initial encounter: Secondary | ICD-10-CM | POA: Diagnosis not present

## 2015-07-10 DIAGNOSIS — S76091A Other specified injury of muscle, fascia and tendon of right hip, initial encounter: Secondary | ICD-10-CM | POA: Diagnosis not present

## 2015-07-10 DIAGNOSIS — S76011A Strain of muscle, fascia and tendon of right hip, initial encounter: Secondary | ICD-10-CM

## 2015-07-10 DIAGNOSIS — Z853 Personal history of malignant neoplasm of breast: Secondary | ICD-10-CM | POA: Diagnosis not present

## 2015-07-10 DIAGNOSIS — S79911A Unspecified injury of right hip, initial encounter: Secondary | ICD-10-CM | POA: Diagnosis present

## 2015-07-10 DIAGNOSIS — Y999 Unspecified external cause status: Secondary | ICD-10-CM | POA: Diagnosis not present

## 2015-07-10 DIAGNOSIS — M1990 Unspecified osteoarthritis, unspecified site: Secondary | ICD-10-CM | POA: Diagnosis not present

## 2015-07-10 DIAGNOSIS — R1030 Lower abdominal pain, unspecified: Secondary | ICD-10-CM | POA: Diagnosis not present

## 2015-07-10 MED ORDER — BUPIVACAINE HCL (PF) 0.5 % IJ SOLN
10.0000 mL | Freq: Once | INTRAMUSCULAR | Status: AC
  Administered 2015-07-10: 10 mL
  Filled 2015-07-10: qty 30

## 2015-07-10 MED ORDER — ONDANSETRON 4 MG PO TBDP
4.0000 mg | ORAL_TABLET | Freq: Once | ORAL | Status: AC | PRN
  Administered 2015-07-10: 4 mg via ORAL
  Filled 2015-07-10: qty 1

## 2015-07-10 MED ORDER — ACETAMINOPHEN 500 MG PO TABS
1000.0000 mg | ORAL_TABLET | Freq: Once | ORAL | Status: AC
  Administered 2015-07-10: 1000 mg via ORAL
  Filled 2015-07-10: qty 2

## 2015-07-10 NOTE — ED Notes (Addendum)
Pt complains of right groin pain radiating to her right leg since Wednesday. Pt states she noticed swelling to right leg a couple of days ago. Pt denies injury to leg. Pt states she feels nauseas due to pain. Pt has hx of multiple myeloma and stage 4 kidney disease.

## 2015-07-10 NOTE — ED Notes (Signed)
Patient transported to CT 

## 2015-07-10 NOTE — Discharge Instructions (Signed)

## 2015-07-10 NOTE — ED Provider Notes (Signed)
CSN: [id number]     Arrival date & time 07/10/15  1304 History   First MD Initiated Contact with Patient 07/10/15 1448     Chief Complaint  Patient presents with  . Groin Pain  . Leg Pain  . Nausea     (Consider location/radiation/quality/duration/timing/severity/associated sxs/prior Treatment) Patient is a 75 y.o. female presenting with leg pain. The history is provided by the patient.  Leg Pain Location:  Hip Time since incident:  4 days Injury: no   Hip location:  R hip Pain details:    Quality:  Aching   Radiates to:  Does not radiate   Severity:  Moderate   Onset quality:  Gradual   Duration:  4 days   Timing:  Constant   Progression:  Worsening Chronicity:  New Prior injury to area:  No Relieved by:  Nothing Worsened by:  Activity Ineffective treatments:  None tried Associated symptoms: no back pain, no decreased ROM, no fever, no numbness and no swelling     Past Medical History  Diagnosis Date  . Hyperkalemia   . Hypothyroidism   . COPD (chronic obstructive pulmonary disease) ([place])   . Hyponatremia   . Dizziness   . Fibromyalgia   . Breast cancer ([place])   . Multiple myeloma   . Mucositis   . Hx of echocardiogram     a.  Echocardiogram (12/26/2012): EF 88-50%, grade 1 diastolic dysfunction;   b.  Echocardiogram (02/2013): EF 55-60%, no WMA, trivial effusion  . Fibromyalgia   . Arthritis   . Hx of cardiovascular stress test     LexiScan with low level exercise Myoview (02/2013): No ischemia, EF 72%; normal study  . Complication of anesthesia   . PONV (postoperative nausea and vomiting) 2008    after mastestomy  . Myocardial infarction [facility])     in past, patient was unaware.   . Anginal pain ([place])     used NTG x 2 May 31 and 06/15/13   . GERD (gastroesophageal reflux disease)   . Headache(784.0)   . Anxiety   . Depression   . CKD (chronic kidney disease) stage 3, GFR 30-59 ml/min   . Anemia   . History of blood transfusion     last one May 12   .  Neuropathy ([place])   . B12 deficiency 12/04/2014  . Pain in joint, pelvic region and thigh 07/07/2015   Past Surgical History  Procedure Laterality Date  . History of port removal    . Status post stem cell transplant on September 28, 2008.    Marland Kitchen Abdominal hysterectomy  1981  . Cholecystectomy  1971  . Mastectomy Left 2008  . Cataract extraction, bilateral    . Breast surgery    . Eye surgery Bilateral     lens implant  . Breast reconstruction    . Portacath placement  12/2012    has had 2  . Av fistula placement Left 06/19/2013    Procedure: CREATION OF LEFT ARM ARTERIOVENOUS (AV) FISTULA ;  Surgeon: Angelia Mould, MD;  Location: [facility] OR;  Service: Vascular;  Laterality: Left;   Family History  Problem Relation Age of Onset  . Arthritis Mother   . Asthma Mother   . Cancer Sister   . Hyperlipidemia Brother    Social History  Substance Use Topics  . Smoking status: Former Smoker -- 1.00 packs/day for 30 years    Types: Cigarettes    Quit date: 02/15/2005  . Smokeless tobacco: Never  Used  . Alcohol Use: No   OB History    No data available     Review of Systems  Constitutional: Negative for fever.  Musculoskeletal: Negative for back pain.  All other systems reviewed and are negative.     Allergies  Codeine; Latex; Other; Onion; Zyprexa; Adhesive; Hydrocodone; Iodinated diagnostic agents; and Sulfa antibiotics  Home Medications   Prior to Admission medications   Medication Sig Start Date End Date Taking? Authorizing Provider  acetaminophen (TYLENOL) 325 MG tablet Take 650 mg by mouth every 6 (six) hours as needed for mild pain or moderate pain.     Historical Provider, MD  albuterol (PROVENTIL HFA;VENTOLIN HFA) 108 (90 Base) MCG/ACT inhaler Inhale 1-2 puffs into the lungs every 6 (six) hours as needed for wheezing or shortness of breath (cough). 02/25/15   Courtney Forcucci, PA-C  ANORO ELLIPTA 62.5-25 MCG/INH AEPB  02/25/15   Historical Provider, MD  ascorbic  acid (VITAMIN C) 250 MG CHEW Chew 250 mg by mouth daily.    Historical Provider, MD  azelastine (ASTELIN) 0.1 % nasal spray Place 2 sprays into both nostrils 2 (two) times daily. Use in each nostril as directed 02/25/15 02/25/16  Courtney Forcucci, PA-C  buPROPion (WELLBUTRIN XL) 300 MG 24 hr tablet Take 300 mg by mouth daily.  11/03/13   Historical Provider, MD  carboxymethylcellulose (REFRESH PLUS) 0.5 % SOLN Place 1 drop into both eyes every 2 (two) hours.    Historical Provider, MD  Cholecalciferol 4000 UNITS TABS Take 4,000 Units by mouth daily.    Historical Provider, MD  citalopram (CELEXA) 40 MG tablet Take 1 tablet (40 mg total) by mouth daily. 04/05/15   [name], MD  cromolyn (OPTICROM) 4 % ophthalmic solution Place 1 drop into both eyes 4 (four) times daily.    Historical Provider, MD  Ferrous Sulfate 134 MG TABS Take 1 tablet (134 mg total) by mouth every other day. 06/09/14   [name], MD  gabapentin (NEURONTIN) 300 MG capsule TAKE 1 CAPSULE BY MOUTH AT BEDTIME. 07/21/14   Courtney Forcucci, PA-C  isosorbide mononitrate (IMDUR) 30 MG 24 hr tablet TAKE 1 TABLET BY MOUTH DAILY. 04/05/15   Josue Hector, MD  levothyroxine (SYNTHROID, LEVOTHROID) 150 MCG tablet TAKE 1 TABLET BY MOUTH ONCE DAILY. 06/17/15   [name], MD  loperamide (IMODIUM) 2 MG capsule Take two tabs po initially, then one tab after each loose stool: max 8 tabs in 24 hours 08/13/14   Courtney Forcucci, PA-C  loratadine (CLARITIN) 10 MG tablet Take 10 mg by mouth daily.    Historical Provider, MD  LORazepam (ATIVAN) 0.5 MG tablet Take 1 tablet (0.5 mg total) by mouth every 8 (eight) hours as needed (nausea). 08/18/14   Curt Bears, MD  loteprednol (ALREX) 0.2 % SUSP Place 1 drop into both eyes 4 (four) times daily. 12/08/13   Vicie Mutters, PA-C  midodrine (PROAMATINE) 10 MG tablet TAKE 1 TABLET BY MOUTH 3 TIMES DAILY. 06/17/15   Josue Hector, MD  nitroGLYCERIN (NITROSTAT) 0.4 MG SL tablet Place 0.4 mg under  the tongue every 5 (five) minutes as needed for chest pain. Reported on 03/30/2015    Historical Provider, MD  pantoprazole (PROTONIX) 40 MG tablet Take 1 tablet daily for Acid reflux & indigestion 02/14/15 08/14/15  [name], MD  pilocarpine (SALAGEN) 5 MG tablet TAKE 1 TABLET BY MOUTH 3 TIMES DAILY. 01/11/15   Susanne Borders, NP  ranitidine (ZANTAC) 300 MG tablet Take 1 to  2 tablets daily for heartburn & reflux to allow wean and transition from PPI / pantoprazole 05/27/15   [name], MD  senna (SENOKOT) 8.6 MG tablet Take 1 tablet by mouth at bedtime.    Historical Provider, MD   BP 125/61 mmHg  Pulse 60  Temp(Src) 98.2 F (36.8 C) (Oral)  Resp 16  SpO2 98% Physical Exam  Constitutional: She is oriented to person, place, and time. She appears well-developed and well-nourished. No distress.  HENT:  Head: Normocephalic.  Eyes: Conjunctivae are normal.  Neck: Neck supple. No tracheal deviation present.  Cardiovascular: Normal rate, regular rhythm and normal heart sounds.   Pulmonary/Chest: Effort normal and breath sounds normal. No respiratory distress.  Abdominal: Soft. She exhibits no distension. There is no tenderness. There is no rebound and no guarding.  Musculoskeletal:       Right hip: She exhibits tenderness. She exhibits no swelling.       Legs: Neurological: She is alert and oriented to person, place, and time.  Skin: Skin is warm and dry.  Psychiatric: She has a normal mood and affect.  Vitals reviewed.   ED Course  Procedures (including critical care time)  Emergency Focused Ultrasound Exam Limited Ultrasound of Lower Extremity for DVT  Performed and interpreted by Dr. Laneta Simmers Indication: Leg swelling Transverse views of right lower extremity are obtained in real time for the purposes of evaluation for deep venous thrombosis.  Findings: + collapsible proximal femoral vein, + collapsible popliteal vein Interpretation: no deep venous thrombosis Images  archived electronically.  CPT Code:   16109   Procedure Note: Trigger Point Injection for Myofascial pain  Performed by Dr. Laneta Simmers Indication: muscle/myofascial pain Muscle body and tendon sheath of the right hip flexor muscle(s) were injected with 0.5% bupivacaine under sterile technique for release of muscle spasm/pain. Patient tolerated well with immediate improvement of symptoms and no immediate complications following procedure.  CPT Code:   1 or 2 muscle bodies: 20552    Labs Review Labs Reviewed - No data to display  Imaging Review Ct Abdomen Pelvis Wo Contrast  07/10/2015  CLINICAL DATA:  Right groin pain, status post hysterectomy, status postcholecystectomy, history of multiple myeloma, stage 4 chronic kidney disease EXAM: CT ABDOMEN AND PELVIS WITHOUT CONTRAST TECHNIQUE: Multidetector CT imaging of the abdomen and pelvis was performed following the standard protocol without IV contrast. COMPARISON:  CT 11/23/2013 FINDINGS: Lower chest: 3 mm nodule in right middle lobe anteriorly is stable in size in appearance from prior exam. Left breast implant is noted. Hepatobiliary: Unenhanced liver shows stable mild intrahepatic biliary ductal dilatation. The patient is status post cholecystectomy. Pancreas: Unenhanced pancreas is unremarkable. Spleen: Unenhanced spleen is unremarkable. Adrenals/Urinary Tract: No right adrenal mass. Stable low-density nodule probable adenoma left adrenal gland measures 1.3 cm. Again noted bilateral renal cortical thinning probable due to atrophy. There is mild dilatation of left extrarenal collecting system. No hydronephrosis or hydroureter. No calcified ureteral calculi are noted bilaterally. No calcified calculi are noted within urinary bladder. Stomach/Bowel: Small hiatal hernia is noted. There is no gastric outlet obstruction. Mild distended small bowel loops are noted in distal small bowel mid pelvis just above the urinary bladder with some fecal like  material. The terminal ileum is somewhat smaller caliber without clear transition point identified. Findings may be due to mild distal small bowel ileus or early small bowel obstruction. Clinical correlation is necessary. There is no pericecal inflammation. The appendix is not identified. Some colonic gas noted in transverse colon.  Some colonic stool noted within descending colon and sigmoid colon. There is no evidence of distal colitis or diverticulitis. No distal colonic obstruction. Vascular/Lymphatic: Mild atherosclerotic calcifications of abdominal aorta and iliac arteries. No aortic aneurysm. No retroperitoneal or mesenteric adenopathy. Reproductive: The patient is status post hysterectomy. No pelvic ascites. Other: No ascites or free air. No inguinal adenopathy. No inguinal hernia. Musculoskeletal: No destructive bony lesions are noted. Sagittal images of the spine shows mild disc space flattening with mild posterior disc bulge at L3-L4 and L4-L5 level. There is significant disc space flattening with endplate sclerotic changes vacuum disc phenomenon mild anterior spurring at L5-S1 level. Facet degenerative changes noted L5 level. Mild dextroscoliosis lumbar spine. IMPRESSION: 1. There is mild segmental dilatation of distal small bowel in mid pelvis just above the urinary bladder with some fecal like material. Early is small bowel obstruction or ileus cannot be excluded. There is no transition point in caliber of small bowel. 2. No pericecal inflammation.  The appendix is not identified. 3. No hydronephrosis or hydroureter. Bilateral renal cortical thinning probable due to atrophy. 4. Stable left adrenal adenoma. 5. No evidence of colitis or diverticulitis. No evidence of distal colonic obstruction. 6. Degenerative changes lumbar spine as described above. Electronically Signed   By: Lahoma Crocker M.D.   On: 07/10/2015 16:17   I have personally reviewed and evaluated these images and lab results as part of my  medical decision-making.   EKG Interpretation None      MDM   Final diagnoses:  Strain of hip flexor, right, initial encounter    75 y.o. female presents with right proximal leg pain at lateral hip flexor muscles that is point-tender. She is scheduled for OP noncontrasted scan of abdomen/pelvis to monitor for progression of her myeloma. This was performed today and showed no bony lesion or other acute abnormality. Suspect fecalization of bowel without ileus or obstruction as Pt is stooling normally and not vomiting. Applied local anaesthetic with improvement of symptoms and Pt able to ambulate without difficulty following. Low suspicion for DVT with pain well localized lateral to compressible vessels which are not tender. I recommended stretching and exercise of the offending muscle group to help with symptoms and close PCP f/u to recheck and consider referral to physical therapy if not improving.     Leo Grosser, MD 07/11/15 [phone]

## 2015-07-11 ENCOUNTER — Other Ambulatory Visit: Payer: Self-pay | Admitting: Medical Oncology

## 2015-07-11 DIAGNOSIS — C9000 Multiple myeloma not having achieved remission: Secondary | ICD-10-CM

## 2015-07-12 ENCOUNTER — Ambulatory Visit (HOSPITAL_COMMUNITY)
Admission: RE | Admit: 2015-07-12 | Discharge: 2015-07-12 | Disposition: A | Discharge status: Home / Self Care | Payer: Medicare Other | Source: Ambulatory Visit | Attending: Nurse Practitioner | Admitting: Nurse Practitioner

## 2015-07-12 ENCOUNTER — Other Ambulatory Visit: Payer: Self-pay | Admitting: Nurse Practitioner

## 2015-07-12 ENCOUNTER — Ambulatory Visit (HOSPITAL_BASED_OUTPATIENT_CLINIC_OR_DEPARTMENT_OTHER): Payer: Medicare Other

## 2015-07-12 ENCOUNTER — Ambulatory Visit (HOSPITAL_COMMUNITY): Payer: Medicare Other

## 2015-07-12 ENCOUNTER — Ambulatory Visit (HOSPITAL_BASED_OUTPATIENT_CLINIC_OR_DEPARTMENT_OTHER): Payer: Medicare Other | Admitting: Nurse Practitioner

## 2015-07-12 ENCOUNTER — Other Ambulatory Visit (HOSPITAL_BASED_OUTPATIENT_CLINIC_OR_DEPARTMENT_OTHER): Payer: Medicare Other

## 2015-07-12 ENCOUNTER — Telehealth: Payer: Self-pay | Admitting: *Deleted

## 2015-07-12 VITALS — BP 132/67 | HR 67 | Temp 98.0°F | Resp 18

## 2015-07-12 DIAGNOSIS — M25551 Pain in right hip: Secondary | ICD-10-CM

## 2015-07-12 DIAGNOSIS — Z5112 Encounter for antineoplastic immunotherapy: Secondary | ICD-10-CM | POA: Diagnosis not present

## 2015-07-12 DIAGNOSIS — C9000 Multiple myeloma not having achieved remission: Secondary | ICD-10-CM

## 2015-07-12 DIAGNOSIS — Z5111 Encounter for antineoplastic chemotherapy: Secondary | ICD-10-CM

## 2015-07-12 DIAGNOSIS — N185 Chronic kidney disease, stage 5: Secondary | ICD-10-CM | POA: Diagnosis not present

## 2015-07-12 DIAGNOSIS — M79651 Pain in right thigh: Secondary | ICD-10-CM | POA: Diagnosis not present

## 2015-07-12 LAB — CBC WITH DIFFERENTIAL/PLATELET
BASO%: 0.5 % (ref 0.0–2.0)
BASOS ABS: 0 10*3/uL (ref 0.0–0.1)
EOS%: 1.5 % (ref 0.0–7.0)
Eosinophils Absolute: 0.1 10*3/uL (ref 0.0–0.5)
HEMATOCRIT: 29.8 % — AB (ref 34.8–46.6)
HGB: 9.5 g/dL — ABNORMAL LOW (ref 11.6–15.9)
LYMPH#: 0.9 10*3/uL (ref 0.9–3.3)
LYMPH%: 11.6 % — AB (ref 14.0–49.7)
MCH: 32.9 pg (ref 25.1–34.0)
MCHC: 31.7 g/dL (ref 31.5–36.0)
MCV: 103.8 fL — ABNORMAL HIGH (ref 79.5–101.0)
MONO#: 0.7 10*3/uL (ref 0.1–0.9)
MONO%: 10.1 % (ref 0.0–14.0)
NEUT#: 5.6 10*3/uL (ref 1.5–6.5)
NEUT%: 76.3 % (ref 38.4–76.8)
Platelets: 132 10*3/uL — ABNORMAL LOW (ref 145–400)
RBC: 2.87 10*6/uL — AB (ref 3.70–5.45)
RDW: 16.3 % — ABNORMAL HIGH (ref 11.2–14.5)
WBC: 7.4 10*3/uL (ref 3.9–10.3)

## 2015-07-12 LAB — COMPREHENSIVE METABOLIC PANEL
ALT: 14 U/L (ref 0–55)
ANION GAP: 9 meq/L (ref 3–11)
AST: 14 U/L (ref 5–34)
Albumin: 3.5 g/dL (ref 3.5–5.0)
Alkaline Phosphatase: 124 U/L (ref 40–150)
BUN: 33.3 mg/dL — ABNORMAL HIGH (ref 7.0–26.0)
CALCIUM: 8.7 mg/dL (ref 8.4–10.4)
CHLORIDE: 116 meq/L — AB (ref 98–109)
CO2: 17 meq/L — AB (ref 22–29)
Creatinine: 3.3 mg/dL (ref 0.6–1.1)
EGFR: 13 mL/min/{1.73_m2} — ABNORMAL LOW (ref >90)
Glucose: 112 mg/dl (ref 70–140)
POTASSIUM: 3.7 meq/L (ref 3.5–5.1)
Sodium: 142 mEq/L (ref 136–145)
Total Bilirubin: 0.47 mg/dL (ref 0.20–1.20)
Total Protein: 6.3 g/dL — ABNORMAL LOW (ref 6.4–8.3)

## 2015-07-12 MED ORDER — SODIUM CHLORIDE 0.9 % IV SOLN
Freq: Once | INTRAVENOUS | Status: AC
  Administered 2015-07-12: 09:00:00 via INTRAVENOUS

## 2015-07-12 MED ORDER — MORPHINE SULFATE (PF) 4 MG/ML IV SOLN
INTRAVENOUS | Status: AC
  Filled 2015-07-12: qty 1

## 2015-07-12 MED ORDER — CARFILZOMIB CHEMO INJECTION 60 MG
37.0000 mg/m2 | Freq: Once | INTRAVENOUS | Status: AC
  Administered 2015-07-12: 70 mg via INTRAVENOUS
  Filled 2015-07-12: qty 30

## 2015-07-12 MED ORDER — SODIUM CHLORIDE 0.9 % IV SOLN
Freq: Once | INTRAVENOUS | Status: AC
  Administered 2015-07-12: 10:00:00 via INTRAVENOUS

## 2015-07-12 MED ORDER — SODIUM CHLORIDE 0.9 % IV SOLN
300.0000 mg/m2 | Freq: Once | INTRAVENOUS | Status: AC
  Administered 2015-07-12: 560 mg via INTRAVENOUS
  Filled 2015-07-12: qty 28

## 2015-07-12 MED ORDER — SODIUM CHLORIDE 0.9 % IV SOLN
Freq: Once | INTRAVENOUS | Status: AC
  Administered 2015-07-12: 10:00:00 via INTRAVENOUS
  Filled 2015-07-12: qty 4

## 2015-07-12 MED ORDER — SODIUM CHLORIDE 0.9 % IJ SOLN
10.0000 mL | INTRAMUSCULAR | Status: DC | PRN
  Administered 2015-07-12: 10 mL
  Filled 2015-07-12: qty 10

## 2015-07-12 MED ORDER — MORPHINE SULFATE 4 MG/ML IJ SOLN
2.0000 mg | Freq: Once | INTRAMUSCULAR | Status: AC
  Administered 2015-07-12: 2 mg via INTRAVENOUS
  Filled 2015-07-12: qty 1

## 2015-07-12 MED ORDER — HEPARIN SOD (PORK) LOCK FLUSH 100 UNIT/ML IV SOLN
500.0000 [IU] | Freq: Once | INTRAVENOUS | Status: AC | PRN
  Administered 2015-07-12: 500 [IU]
  Filled 2015-07-12: qty 5

## 2015-07-12 MED ORDER — LIDOCAINE 5 % EX PTCH: 1.0000 | MEDICATED_PATCH | CUTANEOUS | Status: DC

## 2015-07-12 NOTE — Telephone Encounter (Signed)
TC to patient to notify normal Xray results from today. Pt can be referred to ortho if she would like we can put in a referral. Unable to lm on cell number. No other phone available.

## 2015-07-12 NOTE — Patient Instructions (Signed)
[facility] Discharge Instructions for Patients Receiving Chemotherapy  Today you received the following chemotherapy agents Kyprolis and cytoxan.  To help prevent nausea and vomiting after your treatment, we encourage you to take your nausea medication as directed.  If you develop nausea and vomiting that is not controlled by your nausea medication, call the clinic.   BELOW ARE SYMPTOMS THAT SHOULD BE REPORTED IMMEDIATELY:  *FEVER GREATER THAN 100.5 F  *CHILLS WITH OR WITHOUT FEVER  NAUSEA AND VOMITING THAT IS NOT CONTROLLED WITH YOUR NAUSEA MEDICATION  *UNUSUAL SHORTNESS OF BREATH  *UNUSUAL BRUISING OR BLEEDING  TENDERNESS IN MOUTH AND THROAT WITH OR WITHOUT PRESENCE OF ULCERS  *URINARY PROBLEMS  *BOWEL PROBLEMS  UNUSUAL RASH Items with * indicate a potential emergency and should be followed up as soon as possible.  Feel free to call the clinic you have any questions or concerns. The clinic phone number is [phone].  Please show the Welcome at check-in to the Emergency Department and triage nurse.

## 2015-07-12 NOTE — Progress Notes (Signed)
Per Dr. Julien Nordmann ok to treat with creatine of 3.3

## 2015-07-13 ENCOUNTER — Encounter: Payer: Self-pay | Admitting: Internal Medicine

## 2015-07-13 ENCOUNTER — Encounter: Payer: Self-pay | Admitting: Nurse Practitioner

## 2015-07-13 ENCOUNTER — Ambulatory Visit (HOSPITAL_BASED_OUTPATIENT_CLINIC_OR_DEPARTMENT_OTHER): Payer: Medicare Other

## 2015-07-13 ENCOUNTER — Other Ambulatory Visit: Payer: Self-pay | Admitting: Nurse Practitioner

## 2015-07-13 ENCOUNTER — Ambulatory Visit (HOSPITAL_BASED_OUTPATIENT_CLINIC_OR_DEPARTMENT_OTHER): Payer: Medicare Other | Admitting: Nurse Practitioner

## 2015-07-13 VITALS — BP 101/54 | HR 73 | Temp 97.1°F | Resp 16

## 2015-07-13 DIAGNOSIS — R509 Fever, unspecified: Secondary | ICD-10-CM | POA: Diagnosis not present

## 2015-07-13 DIAGNOSIS — C9000 Multiple myeloma not having achieved remission: Secondary | ICD-10-CM | POA: Diagnosis not present

## 2015-07-13 DIAGNOSIS — Z5112 Encounter for antineoplastic immunotherapy: Secondary | ICD-10-CM

## 2015-07-13 DIAGNOSIS — R609 Edema, unspecified: Secondary | ICD-10-CM

## 2015-07-13 DIAGNOSIS — M25551 Pain in right hip: Secondary | ICD-10-CM | POA: Diagnosis not present

## 2015-07-13 MED ORDER — ACETAMINOPHEN 325 MG PO TABS: 650.0000 mg | ORAL_TABLET | Freq: Once | ORAL | Status: DC

## 2015-07-13 MED ORDER — HEPARIN SOD (PORK) LOCK FLUSH 100 UNIT/ML IV SOLN
500.0000 [IU] | Freq: Once | INTRAVENOUS | Status: AC | PRN
  Administered 2015-07-13: 500 [IU]
  Filled 2015-07-13: qty 5

## 2015-07-13 MED ORDER — SODIUM CHLORIDE 0.9 % IJ SOLN
10.0000 mL | INTRAMUSCULAR | Status: DC | PRN
  Administered 2015-07-13: 10 mL
  Filled 2015-07-13: qty 10

## 2015-07-13 MED ORDER — ACETAMINOPHEN 325 MG PO TABS
ORAL_TABLET | ORAL | Status: AC
  Filled 2015-07-13: qty 2

## 2015-07-13 MED ORDER — ACETAMINOPHEN 325 MG PO TABS
650.0000 mg | ORAL_TABLET | Freq: Once | ORAL | Status: AC
  Administered 2015-07-13: 650 mg via ORAL

## 2015-07-13 MED ORDER — SODIUM CHLORIDE 0.9 % IV SOLN: Freq: Once | INTRAVENOUS | Status: DC

## 2015-07-13 MED ORDER — DEXTROSE 5 % IV SOLN
37.0000 mg/m2 | Freq: Once | INTRAVENOUS | Status: AC
  Administered 2015-07-13: 70 mg via INTRAVENOUS
  Filled 2015-07-13: qty 35

## 2015-07-13 MED ORDER — SODIUM CHLORIDE 0.9 % IV SOLN
Freq: Once | INTRAVENOUS | Status: AC
  Administered 2015-07-13: 15:00:00 via INTRAVENOUS
  Filled 2015-07-13: qty 4

## 2015-07-13 MED ORDER — SODIUM CHLORIDE 0.9 % IV SOLN
Freq: Once | INTRAVENOUS | Status: AC
  Administered 2015-07-13: 15:00:00 via INTRAVENOUS

## 2015-07-13 MED ORDER — CIPROFLOXACIN HCL 250 MG PO TABS: 250.0000 mg | ORAL_TABLET | Freq: Two times a day (BID) | ORAL | Status: DC

## 2015-07-13 NOTE — Assessment & Plan Note (Signed)
Patient has known chronic renal disease; it is managed per her nephrologist.

## 2015-07-13 NOTE — Assessment & Plan Note (Signed)
Patient has some trace, new onset peripheral edema to her bilateral ankles.  She denies any chest pain, chest pressure, shortness of breath.  Vital signs remained stable.  Will hold pretreatment IV fluid rehydration today due to mild edema that was just noted today.   Patient was advised to elevate her feet above the level of heart whenever she is at rest and to continue to remain active.  Also, patient may consider over-the-counter compression stockings as well.  Will continue to monitor closely.

## 2015-07-13 NOTE — Progress Notes (Signed)
approved thru 01/15/16 sent to medical records

## 2015-07-13 NOTE — Assessment & Plan Note (Signed)
Patient states that she began experiencing some right hip/groin, right thigh and right knee pain a few days ago.  She denies any new trauma or injury.  She denies any falls.  She states that she presented to the emergency department over the weekend on 07/10/2015 for further evaluation of this pain.  She underwent a CT of the abdomen/pelvis without contrast while in the emergency department; with no acute findings that could be causing this pain.  She was found to have some degenerative changes noted at the L5 level.  Patient states that she received a Marcaine injection to the right hip region; which did nothing to relieve the pain.  She states that she has been taking tramadol at home; as well as a few ibuprofen.  She states she has allergic reactions to most other narcotic pain medications.  Exam today revealed no obvious pain with palpation to the right hip or groin region.   patient had no pain to the right right region; but did have some discomfort when the right anterior knee was palpated.  Patient was observed with full range of motion; but patient states she does occasionally limp due to the pain.  Also of note-there were no masses or other known injury on exam.  There were also no rashes noted.  Patient was given morphine 2 mg IV while at the cancer center; this did help relieve her pain.  Patient was also prescribed lidocaine patches to apply to the painful sites to see if this helps as well.  She was also advised to continue taking her tramadol as previously directed.  Patient also obtained a right hip, right femur, and a right knee plain film x-rays; which were all negative for any acute findings.  Nurse attempted to call the patient to review the x-ray results; but there was no answer and voicemail was left.  The plan is to follow back up with the patient today to review the x-ray results.  Have reviewed these findings with Dr. Julien Nordmann; and patient may need to consider a orthopedic  referral if the pain continues.  ________________________________  Update: Patient states that her right hip to knee pain continues; but is slightly improved today.  She states that she took 2 of her tramadol as at 11 AM this morning; instead of the typical 1.  Tramadol.  She normally takes.  She refused to receive any other narcotic medications while in the infusion Center today.  Patient states that she already has an orthopedist; and plans to call orthopedist for further evaluation and management.  Patient was also advised to call/return or go directly to the emergency department for any worsening symptoms whatsoever.

## 2015-07-13 NOTE — Progress Notes (Signed)
PA Case [id number] is Approved. For further questions, call [phone].

## 2015-07-13 NOTE — Assessment & Plan Note (Signed)
Patient presented to the [facility] today to receive cycle 8, day 1 of her cyclophosphamide/carfilzomib chemotherapy regimen.  Patient states she's feeling fairly well; with the exception of her right hip to knee pain.  She denies any recent fevers or chills.  Labs obtained today were essentially normal; with the exception of a creatinine which remained elevated at 3.3.  Patient has a history of chronic renal disease; and is managed per her nephrologist.  Reviewed all lab results with Dr. Julien Nordmann; and patient was cleared to proceed with chemotherapy today.  Patient will return tomorrow for cycle 8, day 2 of her chemotherapy plan.  She will return on 07/20/2015 for labs and chemotherapy. __________________________________   Update: Patient presented to the [facility] today to receive cycle 8, day 2 of her chemotherapy.  She has noted some overnight.  Trace edema to her ankles; and also presented to the cancer Center today with temperature of 99.1.  See further notes for details.  Patient is scheduled to return on 07/20/2015 for labs and chemotherapy.

## 2015-07-13 NOTE — Patient Instructions (Signed)
[facility] Discharge Instructions for Patients Receiving Chemotherapy  Today you received the following chemotherapy agents Kyprolis.  To help prevent nausea and vomiting after your treatment, we encourage you to take your nausea medication as prescribed.   If you develop nausea and vomiting that is not controlled by your nausea medication, call the clinic.   BELOW ARE SYMPTOMS THAT SHOULD BE REPORTED IMMEDIATELY:  *FEVER GREATER THAN 100.5 F  *CHILLS WITH OR WITHOUT FEVER  NAUSEA AND VOMITING THAT IS NOT CONTROLLED WITH YOUR NAUSEA MEDICATION  *UNUSUAL SHORTNESS OF BREATH  *UNUSUAL BRUISING OR BLEEDING  TENDERNESS IN MOUTH AND THROAT WITH OR WITHOUT PRESENCE OF ULCERS  *URINARY PROBLEMS  *BOWEL PROBLEMS  UNUSUAL RASH Items with * indicate a potential emergency and should be followed up as soon as possible.  Feel free to call the clinic you have any questions or concerns. The clinic phone number is [phone].  Please show the CHEMO ALERT CARD at check-in to the Emergency Department and triage nurse.   

## 2015-07-13 NOTE — Progress Notes (Signed)
5 all   SYMPTOM MANAGEMENT CLINIC    Chief Complaint: Continued right lower extremity pain and low-grade fever  HPI:  Nancy Norris 75 y.o. female diagnosed with multiple myeloma.  Currently undergoing cyclophosphamide/carfilzomib chemotherapy regimen.  Patient in today to receive cycle 8, day 2 of her chemotherapy regimen.   No history exists.    Review of Systems  Cardiovascular: Positive for leg swelling.  Musculoskeletal: Positive for joint pain.  All other systems reviewed and are negative.   Past Medical History  Diagnosis Date  . Hyperkalemia   . Hypothyroidism   . COPD (chronic obstructive pulmonary disease) (HCC)   . Hyponatremia   . Dizziness   . Fibromyalgia   . Breast cancer (HCC)   . Multiple myeloma   . Mucositis   . Hx of echocardiogram     a.  Echocardiogram (12/26/2012): EF 60-65%, grade 1 diastolic dysfunction;   b.  Echocardiogram (02/2013): EF 55-60%, no WMA, trivial effusion  . Fibromyalgia   . Arthritis   . Hx of cardiovascular stress test     LexiScan with low level exercise Myoview (02/2013): No ischemia, EF 72%; normal study  . Complication of anesthesia   . PONV (postoperative nausea and vomiting) 2008    after mastestomy  . Myocardial infarction [facility])     in past, patient was unaware.   . Anginal pain (HCC)     used NTG x 2 May 31 and 06/15/13   . GERD (gastroesophageal reflux disease)   . Headache(784.0)   . Anxiety   . Depression   . CKD (chronic kidney disease) stage 3, GFR 30-59 ml/min   . Anemia   . History of blood transfusion     last one May 12   . Neuropathy (HCC)   . B12 deficiency 12/04/2014  . Pain in joint, pelvic region and thigh 07/07/2015    Past Surgical History  Procedure Laterality Date  . History of port removal    . Status post stem cell transplant on September 28, 2008.    Marland Kitchen Abdominal hysterectomy  1981  . Cholecystectomy  1971  . Mastectomy Left 2008  . Cataract extraction, bilateral    . Breast surgery     . Eye surgery Bilateral     lens implant  . Breast reconstruction    . Portacath placement  12/2012    has had 2  . Av fistula placement Left 06/19/2013    Procedure: CREATION OF LEFT ARM ARTERIOVENOUS (AV) FISTULA ;  Surgeon: Chuck Hint, MD;  Location: [facility] OR;  Service: Vascular;  Laterality: Left;    has Hypercalcemia; HX: breast cancer; Asthma, chronic; Hypothyroid; Chronic diastolic heart failure (HCC); HTN (hypertension); GERD (gastroesophageal reflux disease); COPD (chronic obstructive pulmonary disease) with emphysema (HCC); Fibromyalgia; Mixed hyperlipidemia; Prediabetes; Vitamin D deficiency; Medication management; Depression, controlled; Multiple myeloma (HCC); Orthostatic hypotension; CKD (chronic kidney disease) stage 5, GFR less than 15 ml/min (HCC); Encounter for antineoplastic chemotherapy; CHF (congestive heart failure) (HCC); Malnutrition of moderate degree; B12 deficiency; Thrombocytopenia (HCC); SOB (shortness of breath); Neoplastic malignant related fatigue; Anemia in neoplastic disease; Multiple myeloma not having achieved remission (HCC); Pain in joint, pelvic region and thigh; Right hip pain; Fever; and Peripheral edema on her problem list.    is allergic to codeine; latex; other; onion; zyprexa; adhesive; hydrocodone; iodinated diagnostic agents; and sulfa antibiotics.    Medication List       This list is accurate as of: 07/13/15  4:53 PM.  Always  use your most recent med list.               acetaminophen 325 MG tablet  Commonly known as:  TYLENOL  Take 650 mg by mouth every 6 (six) hours as needed for mild pain or moderate pain.     albuterol 108 (90 Base) MCG/ACT inhaler  Commonly known as:  PROVENTIL HFA;VENTOLIN HFA  Inhale 1-2 puffs into the lungs every 6 (six) hours as needed for wheezing or shortness of breath (cough).     ANORO ELLIPTA 62.5-25 MCG/INH Aepb  Generic drug:  umeclidinium-vilanterol     ascorbic acid 250 MG Chew  Commonly  known as:  VITAMIN C  Chew 250 mg by mouth daily.     azelastine 0.1 % nasal spray  Commonly known as:  ASTELIN  Place 2 sprays into both nostrils 2 (two) times daily. Use in each nostril as directed     buPROPion 300 MG 24 hr tablet  Commonly known as:  WELLBUTRIN XL  Take 300 mg by mouth daily.     carboxymethylcellulose 0.5 % Soln  Commonly known as:  REFRESH PLUS  Place 1 drop into both eyes every 2 (two) hours.     Cholecalciferol 4000 units Tabs  Take 4,000 Units by mouth daily.     ciprofloxacin 250 MG tablet  Commonly known as:  CIPRO  Take 1 tablet (250 mg total) by mouth 2 (two) times daily.     citalopram 40 MG tablet  Commonly known as:  CELEXA  Take 1 tablet (40 mg total) by mouth daily.     cromolyn 4 % ophthalmic solution  Commonly known as:  OPTICROM  Place 1 drop into both eyes 4 (four) times daily.     Ferrous Sulfate 134 MG Tabs  Take 1 tablet (134 mg total) by mouth every other day.     gabapentin 300 MG capsule  Commonly known as:  NEURONTIN  TAKE 1 CAPSULE BY MOUTH AT BEDTIME.     isosorbide mononitrate 30 MG 24 hr tablet  Commonly known as:  IMDUR  TAKE 1 TABLET BY MOUTH DAILY.     levothyroxine 150 MCG tablet  Commonly known as:  SYNTHROID, LEVOTHROID  TAKE 1 TABLET BY MOUTH ONCE DAILY.     lidocaine 5 %  Commonly known as:  LIDODERM  Place 1 patch onto the skin daily. Remove & Discard patch within 12 hours or as directed by MD     loperamide 2 MG capsule  Commonly known as:  IMODIUM  Take two tabs po initially, then one tab after each loose stool: max 8 tabs in 24 hours     loratadine 10 MG tablet  Commonly known as:  CLARITIN  Take 10 mg by mouth daily.     LORazepam 0.5 MG tablet  Commonly known as:  ATIVAN  Take 1 tablet (0.5 mg total) by mouth every 8 (eight) hours as needed (nausea).     loteprednol 0.2 % Susp  Commonly known as:  ALREX  Place 1 drop into both eyes 4 (four) times daily.     midodrine 10 MG tablet    Commonly known as:  PROAMATINE  TAKE 1 TABLET BY MOUTH 3 TIMES DAILY.     nitroGLYCERIN 0.4 MG SL tablet  Commonly known as:  NITROSTAT  Place 0.4 mg under the tongue every 5 (five) minutes as needed for chest pain. Reported on 03/30/2015     pantoprazole 40 MG tablet  Commonly known  as:  PROTONIX  Take 1 tablet daily for Acid reflux & indigestion     pilocarpine 5 MG tablet  Commonly known as:  SALAGEN  TAKE 1 TABLET BY MOUTH 3 TIMES DAILY.     ranitidine 300 MG tablet  Commonly known as:  ZANTAC  Take 1 to 2 tablets daily for heartburn & reflux to allow wean and transition from PPI / pantoprazole     senna 8.6 MG tablet  Commonly known as:  SENOKOT  Take 1 tablet by mouth at bedtime.         PHYSICAL EXAMINATION  Oncology Vitals 07/13/2015 07/13/2015  Temp 97.1 99.5  Pulse - 73  Resp - 16  SpO2 - 98   BP Readings from Last 2 Encounters:  07/13/15 101/54  07/12/15 132/67    Physical Exam  Constitutional: She is oriented to person, place, and time and well-developed, well-nourished, and in no distress.  HENT:  Head: Normocephalic and atraumatic.  Eyes: Conjunctivae and EOM are normal. Pupils are equal, round, and reactive to light.  Neck: Normal range of motion.  Pulmonary/Chest: Effort normal. No respiratory distress.  Musculoskeletal: She exhibits edema.  Trace edema to bilateral ankles.  Neurological: She is alert and oriented to person, place, and time.  Skin:  Patient has a left upper arm AV fistula which protrudes as baseline.  Positive bruit noted.  Psychiatric: Affect normal.  Nursing note and vitals reviewed.   LABORATORY DATA:. Appointment on 07/12/2015  Component Date Value Ref Range Status  . WBC 07/12/2015 7.4  3.9 - 10.3 10e3/uL Final  . NEUT# 07/12/2015 5.6  1.5 - 6.5 10e3/uL Final  . HGB 07/12/2015 9.5* 11.6 - 15.9 g/dL Final  . HCT 07/12/2015 29.8* 34.8 - 46.6 % Final  . Platelets 07/12/2015 132* 145 - 400 10e3/uL Final  . MCV 07/12/2015  103.8* 79.5 - 101.0 fL Final  . MCH 07/12/2015 32.9  25.1 - 34.0 pg Final  . MCHC 07/12/2015 31.7  31.5 - 36.0 g/dL Final  . RBC 07/12/2015 2.87* 3.70 - 5.45 10e6/uL Final  . RDW 07/12/2015 16.3* 11.2 - 14.5 % Final  . lymph# 07/12/2015 0.9  0.9 - 3.3 10e3/uL Final  . MONO# 07/12/2015 0.7  0.1 - 0.9 10e3/uL Final  . Eosinophils Absolute 07/12/2015 0.1  0.0 - 0.5 10e3/uL Final  . Basophils Absolute 07/12/2015 0.0  0.0 - 0.1 10e3/uL Final  . NEUT% 07/12/2015 76.3  38.4 - 76.8 % Final  . LYMPH% 07/12/2015 11.6* 14.0 - 49.7 % Final  . MONO% 07/12/2015 10.1  0.0 - 14.0 % Final  . EOS% 07/12/2015 1.5  0.0 - 7.0 % Final  . BASO% 07/12/2015 0.5  0.0 - 2.0 % Final  . Sodium 07/12/2015 142  136 - 145 mEq/L Final  . Potassium 07/12/2015 3.7  3.5 - 5.1 mEq/L Final  . Chloride 07/12/2015 116* 98 - 109 mEq/L Final  . CO2 07/12/2015 17* 22 - 29 mEq/L Final  . Glucose 07/12/2015 112  70 - 140 mg/dl Final   Glucose reference range is for nonfasting patients. Fasting glucose reference range is 70- 100.  Marland Kitchen BUN 07/12/2015 33.3* 7.0 - 26.0 mg/dL Final  . Creatinine 07/12/2015 3.3* 0.6 - 1.1 mg/dL Final  . Total Bilirubin 07/12/2015 0.47  0.20 - 1.20 mg/dL Final  . Alkaline Phosphatase 07/12/2015 124  40 - 150 U/L Final  . AST 07/12/2015 14  5 - 34 U/L Final  . ALT 07/12/2015 14  0 - 55 U/L Final  .  Total Protein 07/12/2015 6.3* 6.4 - 8.3 g/dL Final  . Albumin 07/12/2015 3.5  3.5 - 5.0 g/dL Final  . Calcium 07/12/2015 8.7  8.4 - 10.4 mg/dL Final  . Anion Gap 07/12/2015 9  3 - 11 mEq/L Final  . EGFR 07/12/2015 13* >90 ml/min/1.73 m2 Final   eGFR is calculated using the CKD-EPI Creatinine Equation (2009)    RADIOGRAPHIC STUDIES: Ct Abdomen Pelvis Wo Contrast  07/10/2015  CLINICAL DATA:  Right groin pain, status post hysterectomy, status postcholecystectomy, history of multiple myeloma, stage 4 chronic kidney disease EXAM: CT ABDOMEN AND PELVIS WITHOUT CONTRAST TECHNIQUE: Multidetector CT imaging of the  abdomen and pelvis was performed following the standard protocol without IV contrast. COMPARISON:  CT 11/23/2013 FINDINGS: Lower chest: 3 mm nodule in right middle lobe anteriorly is stable in size in appearance from prior exam. Left breast implant is noted. Hepatobiliary: Unenhanced liver shows stable mild intrahepatic biliary ductal dilatation. The patient is status post cholecystectomy. Pancreas: Unenhanced pancreas is unremarkable. Spleen: Unenhanced spleen is unremarkable. Adrenals/Urinary Tract: No right adrenal mass. Stable low-density nodule probable adenoma left adrenal gland measures 1.3 cm. Again noted bilateral renal cortical thinning probable due to atrophy. There is mild dilatation of left extrarenal collecting system. No hydronephrosis or hydroureter. No calcified ureteral calculi are noted bilaterally. No calcified calculi are noted within urinary bladder. Stomach/Bowel: Small hiatal hernia is noted. There is no gastric outlet obstruction. Mild distended small bowel loops are noted in distal small bowel mid pelvis just above the urinary bladder with some fecal like material. The terminal ileum is somewhat smaller caliber without clear transition point identified. Findings may be due to mild distal small bowel ileus or early small bowel obstruction. Clinical correlation is necessary. There is no pericecal inflammation. The appendix is not identified. Some colonic gas noted in transverse colon. Some colonic stool noted within descending colon and sigmoid colon. There is no evidence of distal colitis or diverticulitis. No distal colonic obstruction. Vascular/Lymphatic: Mild atherosclerotic calcifications of abdominal aorta and iliac arteries. No aortic aneurysm. No retroperitoneal or mesenteric adenopathy. Reproductive: The patient is status post hysterectomy. No pelvic ascites. Other: No ascites or free air. No inguinal adenopathy. No inguinal hernia. Musculoskeletal: No destructive bony lesions are  noted. Sagittal images of the spine shows mild disc space flattening with mild posterior disc bulge at L3-L4 and L4-L5 level. There is significant disc space flattening with endplate sclerotic changes vacuum disc phenomenon mild anterior spurring at L5-S1 level. Facet degenerative changes noted L5 level. Mild dextroscoliosis lumbar spine. IMPRESSION: 1. There is mild segmental dilatation of distal small bowel in mid pelvis just above the urinary bladder with some fecal like material. Early is small bowel obstruction or ileus cannot be excluded. There is no transition point in caliber of small bowel. 2. No pericecal inflammation.  The appendix is not identified. 3. No hydronephrosis or hydroureter. Bilateral renal cortical thinning probable due to atrophy. 4. Stable left adrenal adenoma. 5. No evidence of colitis or diverticulitis. No evidence of distal colonic obstruction. 6. Degenerative changes lumbar spine as described above. Electronically Signed   By: Lahoma Crocker M.D.   On: 07/10/2015 16:17   Dg Hip Unilat With Pelvis 2-3 Views Right  07/12/2015  CLINICAL DATA:  Pain in the right groin developing 3 days ago and extending into the distal right femur. EXAM: DG HIP (WITH OR WITHOUT PELVIS) 2-3V RIGHT COMPARISON:  None. FINDINGS: No evidence of hip fracture or subluxation. History of multiple myeloma. No myeloma  focus seen in the proximal femora or pelvic ring. No notable hip degeneration. There is lower lumbar facet arthropathy and L5-S1 disc degeneration. IMPRESSION: No explanation for acute pain.  No visible myelomatous focus. Electronically Signed   By: Monte Fantasia M.D.   On: 07/12/2015 14:02   Dg Femur, Min 2 Views Right  07/12/2015  CLINICAL DATA:  Pain in the right groin developing 3 days ago and extending into the distal right femur. EXAM: RIGHT FEMUR 2 VIEWS COMPARISON:  None. FINDINGS: There is no evidence of fracture or other focal bone lesions. Soft tissues are unremarkable. IMPRESSION:  Negative. Electronically Signed   By: Monte Fantasia M.D.   On: 07/12/2015 14:03    ASSESSMENT/PLAN:    Right hip pain Patient states that she began experiencing some right hip/groin, right thigh and right knee pain a few days ago.  She denies any new trauma or injury.  She denies any falls.  She states that she presented to the emergency department over the weekend on 07/10/2015 for further evaluation of this pain.  She underwent a CT of the abdomen/pelvis without contrast while in the emergency department; with no acute findings that could be causing this pain.  She was found to have some degenerative changes noted at the L5 level.  Patient states that she received a Marcaine injection to the right hip region; which did nothing to relieve the pain.  She states that she has been taking tramadol at home; as well as a few ibuprofen.  She states she has allergic reactions to most other narcotic pain medications.  Exam today revealed no obvious pain with palpation to the right hip or groin region.   patient had no pain to the right right region; but did have some discomfort when the right anterior knee was palpated.  Patient was observed with full range of motion; but patient states she does occasionally limp due to the pain.  Also of note-there were no masses or other known injury on exam.  There were also no rashes noted.  Patient was given morphine 2 mg IV while at the cancer center; this did help relieve her pain.  Patient was also prescribed lidocaine patches to apply to the painful sites to see if this helps as well.  She was also advised to continue taking her tramadol as previously directed.  Patient also obtained a right hip, right femur, and a right knee plain film x-rays; which were all negative for any acute findings.  Nurse attempted to call the patient to review the x-ray results; but there was no answer and voicemail was left.  The plan is to follow back up with the patient today to  review the x-ray results.  Have reviewed these findings with Dr. Julien Nordmann; and patient may need to consider a orthopedic referral if the pain continues.  ________________________________  Update: Patient states that her right hip to knee pain continues; but is slightly improved today.  She states that she took 2 of her tramadol as at 11 AM this morning; instead of the typical 1.  Tramadol.  She normally takes.  She refused to receive any other narcotic medications while in the infusion Center today.  Patient states that she already has an orthopedist; and plans to call orthopedist for further evaluation and management.  Patient was also advised to call/return or go directly to the emergency department for any worsening symptoms whatsoever.    Peripheral edema Patient has some trace, new onset peripheral edema to  her bilateral ankles.  She denies any chest pain, chest pressure, shortness of breath.  Vital signs remained stable.  Will hold pretreatment IV fluid rehydration today due to mild edema that was just noted today.   Patient was advised to elevate her feet above the level of heart whenever she is at rest and to continue to remain active.  Also, patient may consider over-the-counter compression stockings as well.  Will continue to monitor closely.  Multiple myeloma [facility]) Patient presented to the [facility] today to receive cycle 8, day 1 of her cyclophosphamide/carfilzomib chemotherapy regimen.  Patient states she's feeling fairly well; with the exception of her right hip to knee pain.  She denies any recent fevers or chills.  Labs obtained today were essentially normal; with the exception of a creatinine which remained elevated at 3.3.  Patient has a history of chronic renal disease; and is managed per her nephrologist.  Reviewed all lab results with Dr. Julien Nordmann; and patient was cleared to proceed with chemotherapy today.  Patient will return tomorrow for cycle 8, day 2 of her  chemotherapy plan.  She will return on 07/20/2015 for labs and chemotherapy. __________________________________   Update: Patient presented to the [facility] today to receive cycle 8, day 2 of her chemotherapy.  She has noted some overnight.  Trace edema to her ankles; and also presented to the cancer Center today with temperature of 99.1.  See further notes for details.  Patient is scheduled to return on 07/20/2015 for labs and chemotherapy.    Fever Patient presented to the [facility] today with temperature of 99.1.  She continues to deny any URI or UTI symptoms.  She denies any nausea, vomiting, or constipation.  She denies any new onset of pain.  She states that she has had 4 episodes of loose stools/diarrhea within the last 48 hours.  She states that the diarrhea episode started before this cycle of chemotherapy.  She has Lomotil all hand; but does not want to initiate the Lomotil treatment was absolutely necessary.  Patient was given Tylenol 650 mg orally while the cancer Center.  Reviewed all findings with Dr. Julien Nordmann; he recommended that patient be prescribed Cipro renal dose at 250 mg twice daily for any infection issues.  Patient was also advised to continue Tylenol on an as-needed basis.  She was advised to call/return or go directly to the emergency department for any worsening symptoms whatsoever.  The plan is for this provider to follow-up with the patient on Friday and see if she is feeling better.   Patient stated understanding of all instructions; and was in agreement with this plan of care. The patient knows to call the clinic with any problems, questions or concerns.   Total time spent with patient was 15 minutes;  with greater than 75 percent of that time spent in face to face counseling regarding patient's symptoms,  and coordination of care and follow up.  Disclaimer:This dictation was prepared with Dragon/digital dictation along with [employer]. Any  transcriptional errors that result from this process are unintentional.  Drue Second, NP 07/13/2015

## 2015-07-13 NOTE — Assessment & Plan Note (Signed)
Patient presented to the [facility] today to receive cycle 8, day 1 of her cyclophosphamide/carfilzomib chemotherapy regimen.  Patient states she's feeling fairly well; with the exception of her right hip to knee pain.  She denies any recent fevers or chills.  Labs obtained today were essentially normal; with the exception of a creatinine which remained elevated at 3.3.  Patient has a history of chronic renal disease; and is managed per her nephrologist.  Reviewed all lab results with Dr. Julien Nordmann; and patient was cleared to proceed with chemotherapy today.  Patient will return tomorrow for cycle 8, day 2 of her chemotherapy plan.  She will return on 07/20/2015 for labs and chemotherapy.

## 2015-07-13 NOTE — Assessment & Plan Note (Signed)
Patient presented to the [facility] today with temperature of 99.1.  She continues to deny any URI or UTI symptoms.  She denies any nausea, vomiting, or constipation.  She denies any new onset of pain.  She states that she has had 4 episodes of loose stools/diarrhea within the last 48 hours.  She states that the diarrhea episode started before this cycle of chemotherapy.  She has Lomotil all hand; but does not want to initiate the Lomotil treatment was absolutely necessary.  Patient was given Tylenol 650 mg orally while the cancer Center.  Reviewed all findings with Dr. Julien Nordmann; he recommended that patient be prescribed Cipro renal dose at 250 mg twice daily for any infection issues.  Patient was also advised to continue Tylenol on an as-needed basis.  She was advised to call/return or go directly to the emergency department for any worsening symptoms whatsoever.  The plan is for this provider to follow-up with the patient on Friday and see if she is feeling better.

## 2015-07-13 NOTE — Assessment & Plan Note (Signed)
Patient states that she began experiencing some right hip/groin, right thigh and right knee pain a few days ago.  She denies any new trauma or injury.  She denies any falls.  She states that she presented to the emergency department over the weekend on 07/10/2015 for further evaluation of this pain.  She underwent a CT of the abdomen/pelvis without contrast while in the emergency department; with no acute findings that could be causing this pain.  She was found to have some degenerative changes noted at the L5 level.  Patient states that she received a Marcaine injection to the right hip region; which did nothing to relieve the pain.  She states that she has been taking tramadol at home; as well as a few ibuprofen.  She states she has allergic reactions to most other narcotic pain medications.  Exam today revealed no obvious pain with palpation to the right hip or groin region.   patient had no pain to the right right region; but did have some discomfort when the right anterior knee was palpated.  Patient was observed with full range of motion; but patient states she does occasionally limp due to the pain.  Also of note-there were no masses or other known injury on exam.  There were also no rashes noted.  Patient was given morphine 2 mg IV while at the cancer center; this did help relieve her pain.  Patient was also prescribed lidocaine patches to apply to the painful sites to see if this helps as well.  She was also advised to continue taking her tramadol as previously directed.  Patient also obtained a right hip, right femur, and a right knee plain film x-rays; which were all negative for any acute findings.  Nurse attempted to call the patient to review the x-ray results; but there was no answer and voicemail was left.  The plan is to follow back up with the patient today to review the x-ray results.  Have reviewed these findings with Dr. Julien Nordmann; and patient may need to consider a orthopedic  referral if the pain continues.

## 2015-07-13 NOTE — Progress Notes (Signed)
SYMPTOM MANAGEMENT CLINIC    Chief Complaint: Right hip to knee pain  HPI:  Nancy Norris 75 y.o. female diagnosed with multiple myeloma.  Currently undergoing cyclophosphamide/carfilzomib chemotherapy regimen.  Patient states that she began experiencing some right hip/groin, right thigh and right knee pain a few days ago.  She denies any new trauma or injury.  She denies any falls.  She states that she presented to the emergency department over the weekend on 07/10/2015 for further evaluation of this pain.  She underwent a CT of the abdomen/pelvis without contrast while in the emergency department; with no acute findings that could be causing this pain.  She was found to have some degenerative changes noted at the L5 level.  Patient states that she received a Marcaine injection to the right hip region; which did nothing to relieve the pain.  She states that she has been taking tramadol at home; as well as a few ibuprofen.  She states she has allergic reactions to most other narcotic pain medications.  Exam today revealed no obvious pain with palpation to the right hip or groin region.   patient had no pain to the right right region; but did have some discomfort when the right anterior knee was palpated.  Patient was observed with full range of motion; but patient states she does occasionally limp due to the pain.  Also of note-there were no masses or other known injury on exam.  There were also no rashes noted.  Patient was given morphine 2 mg IV while at the cancer center; this did help relieve her pain.  Patient was also prescribed lidocaine patches to apply to the painful sites to see if this helps as well.  She was also advised to continue taking her tramadol as previously directed.  Patient also obtained a right hip, right femur, and a right knee plain film x-rays; which were all negative for any acute findings.  Nurse attempted to call the patient to review the x-ray results; but  there was no answer and voicemail was left.  The plan is to follow back up with the patient today to review the x-ray results.  Have reviewed these findings with Dr. Julien Nordmann; and patient may need to consider a orthopedic referral if the pain continues.      No history exists.    Review of Systems  Musculoskeletal: Positive for joint pain.  All other systems reviewed and are negative.   Past Medical History  Diagnosis Date  . Hyperkalemia   . Hypothyroidism   . COPD (chronic obstructive pulmonary disease) ([place])   . Hyponatremia   . Dizziness   . Fibromyalgia   . Breast cancer ([place])   . Multiple myeloma   . Mucositis   . Hx of echocardiogram     a.  Echocardiogram (12/26/2012): EF 87-68%, grade 1 diastolic dysfunction;   b.  Echocardiogram (02/2013): EF 55-60%, no WMA, trivial effusion  . Fibromyalgia   . Arthritis   . Hx of cardiovascular stress test     LexiScan with low level exercise Myoview (02/2013): No ischemia, EF 72%; normal study  . Complication of anesthesia   . PONV (postoperative nausea and vomiting) 2008    after mastestomy  . Myocardial infarction [facility])     in past, patient was unaware.   . Anginal pain ([place])     used NTG x 2 May 31 and 06/15/13   . GERD (gastroesophageal reflux disease)   . Headache(784.0)   .  Anxiety   . Depression   . CKD (chronic kidney disease) stage 3, GFR 30-59 ml/min   . Anemia   . History of blood transfusion     last one May 12   . Neuropathy ([place])   . B12 deficiency 12/04/2014  . Pain in joint, pelvic region and thigh 07/07/2015    Past Surgical History  Procedure Laterality Date  . History of port removal    . Status post stem cell transplant on September 28, 2008.    Marland Kitchen Abdominal hysterectomy  1981  . Cholecystectomy  1971  . Mastectomy Left 2008  . Cataract extraction, bilateral    . Breast surgery    . Eye surgery Bilateral     lens implant  . Breast reconstruction    . Portacath placement  12/2012    has had 2  .  Av fistula placement Left 06/19/2013    Procedure: CREATION OF LEFT ARM ARTERIOVENOUS (AV) FISTULA ;  Surgeon: Angelia Mould, MD;  Location: [place];  Service: Vascular;  Laterality: Left;    has Hypercalcemia; HX: breast cancer; Asthma, chronic; Hypothyroid; Chronic diastolic heart failure (HCC); HTN (hypertension); GERD (gastroesophageal reflux disease); COPD (chronic obstructive pulmonary disease) with emphysema ([place]); Fibromyalgia; Mixed hyperlipidemia; Prediabetes; Vitamin D deficiency; Medication management; Depression, controlled; Multiple myeloma ([place]); Orthostatic hypotension; CKD (chronic kidney disease) stage 5, GFR less than 15 ml/min (HCC); Encounter for antineoplastic chemotherapy; CHF (congestive heart failure) ([place]); Malnutrition of moderate degree; B12 deficiency; Thrombocytopenia ([place]); SOB (shortness of breath); Neoplastic malignant related fatigue; Anemia in neoplastic disease; Multiple myeloma not having achieved remission ([place]); Pain in joint, pelvic region and thigh; and Right hip pain on her problem list.    is allergic to codeine; latex; other; onion; zyprexa; adhesive; hydrocodone; iodinated diagnostic agents; and sulfa antibiotics.    Medication List       This list is accurate as of: 07/12/15 11:59 PM.  Always use your most recent med list.               acetaminophen 325 MG tablet  Commonly known as:  TYLENOL  Take 650 mg by mouth every 6 (six) hours as needed for mild pain or moderate pain.     albuterol 108 (90 Base) MCG/ACT inhaler  Commonly known as:  PROVENTIL HFA;VENTOLIN HFA  Inhale 1-2 puffs into the lungs every 6 (six) hours as needed for wheezing or shortness of breath (cough).     ANORO ELLIPTA 62.5-25 MCG/INH Aepb  Generic drug:  umeclidinium-vilanterol     ascorbic acid 250 MG Chew  Commonly known as:  VITAMIN C  Chew 250 mg by mouth daily.     azelastine 0.1 % nasal spray  Commonly known as:  ASTELIN  Place 2 sprays into both nostrils 2  (two) times daily. Use in each nostril as directed     buPROPion 300 MG 24 hr tablet  Commonly known as:  WELLBUTRIN XL  Take 300 mg by mouth daily.     carboxymethylcellulose 0.5 % Soln  Commonly known as:  REFRESH PLUS  Place 1 drop into both eyes every 2 (two) hours.     Cholecalciferol 4000 units Tabs  Take 4,000 Units by mouth daily.     citalopram 40 MG tablet  Commonly known as:  CELEXA  Take 1 tablet (40 mg total) by mouth daily.     cromolyn 4 % ophthalmic solution  Commonly known as:  OPTICROM  Place 1 drop into both eyes 4 (  four) times daily.     Ferrous Sulfate 134 MG Tabs  Take 1 tablet (134 mg total) by mouth every other day.     gabapentin 300 MG capsule  Commonly known as:  NEURONTIN  TAKE 1 CAPSULE BY MOUTH AT BEDTIME.     isosorbide mononitrate 30 MG 24 hr tablet  Commonly known as:  IMDUR  TAKE 1 TABLET BY MOUTH DAILY.     levothyroxine 150 MCG tablet  Commonly known as:  SYNTHROID, LEVOTHROID  TAKE 1 TABLET BY MOUTH ONCE DAILY.     lidocaine 5 %  Commonly known as:  LIDODERM  Place 1 patch onto the skin daily. Remove & Discard patch within 12 hours or as directed by MD     loperamide 2 MG capsule  Commonly known as:  IMODIUM  Take two tabs po initially, then one tab after each loose stool: max 8 tabs in 24 hours     loratadine 10 MG tablet  Commonly known as:  CLARITIN  Take 10 mg by mouth daily.     LORazepam 0.5 MG tablet  Commonly known as:  ATIVAN  Take 1 tablet (0.5 mg total) by mouth every 8 (eight) hours as needed (nausea).     loteprednol 0.2 % Susp  Commonly known as:  ALREX  Place 1 drop into both eyes 4 (four) times daily.     midodrine 10 MG tablet  Commonly known as:  PROAMATINE  TAKE 1 TABLET BY MOUTH 3 TIMES DAILY.     nitroGLYCERIN 0.4 MG SL tablet  Commonly known as:  NITROSTAT  Place 0.4 mg under the tongue every 5 (five) minutes as needed for chest pain. Reported on 03/30/2015     pantoprazole 40 MG tablet    Commonly known as:  PROTONIX  Take 1 tablet daily for Acid reflux & indigestion     pilocarpine 5 MG tablet  Commonly known as:  SALAGEN  TAKE 1 TABLET BY MOUTH 3 TIMES DAILY.     ranitidine 300 MG tablet  Commonly known as:  ZANTAC  Take 1 to 2 tablets daily for heartburn & reflux to allow wean and transition from PPI / pantoprazole     senna 8.6 MG tablet  Commonly known as:  SENOKOT  Take 1 tablet by mouth at bedtime.         PHYSICAL EXAMINATION  Oncology Vitals 07/12/2015 07/10/2015  Height - -  Weight - -  Weight (lbs) - -  BMI (kg/m2) - -  Temp 98 98.4  Pulse 67 62  Resp 18 18  SpO2 100 98  BSA (m2) - -   BP Readings from Last 2 Encounters:  07/12/15 132/67  07/10/15 125/65    Physical Exam  Constitutional: She is oriented to person, place, and time. Vital signs are normal. She appears unhealthy.  HENT:  Head: Normocephalic and atraumatic.  Eyes: Conjunctivae and EOM are normal. Pupils are equal, round, and reactive to light. Right eye exhibits no discharge. Left eye exhibits no discharge. No scleral icterus.  Neck: Normal range of motion.  Pulmonary/Chest: Effort normal. No respiratory distress.  Musculoskeletal: Normal range of motion. She exhibits tenderness. She exhibits no edema.  Exam today revealed no obvious pain with palpation to the right hip or groin region.   patient had no pain to the right right region; but did have some discomfort when the right anterior knee was palpated.  Patient was observed with full range of motion; but patient states she  does occasionally limp due to the pain.  Also of note-there were no masses or other known injury on exam.  There were also no rashes noted.        Neurological: She is alert and oriented to person, place, and time.  Skin: Skin is warm and dry. No rash noted. No erythema. There is pallor.  Psychiatric: Affect normal.  Nursing note and vitals reviewed.   LABORATORY DATA:. Appointment on 07/12/2015   Component Date Value Ref Range Status  . WBC 07/12/2015 7.4  3.9 - 10.3 10e3/uL Final  . NEUT# 07/12/2015 5.6  1.5 - 6.5 10e3/uL Final  . HGB 07/12/2015 9.5* 11.6 - 15.9 g/dL Final  . HCT 07/12/2015 29.8* 34.8 - 46.6 % Final  . Platelets 07/12/2015 132* 145 - 400 10e3/uL Final  . MCV 07/12/2015 103.8* 79.5 - 101.0 fL Final  . MCH 07/12/2015 32.9  25.1 - 34.0 pg Final  . MCHC 07/12/2015 31.7  31.5 - 36.0 g/dL Final  . RBC 07/12/2015 2.87* 3.70 - 5.45 10e6/uL Final  . RDW 07/12/2015 16.3* 11.2 - 14.5 % Final  . lymph# 07/12/2015 0.9  0.9 - 3.3 10e3/uL Final  . MONO# 07/12/2015 0.7  0.1 - 0.9 10e3/uL Final  . Eosinophils Absolute 07/12/2015 0.1  0.0 - 0.5 10e3/uL Final  . Basophils Absolute 07/12/2015 0.0  0.0 - 0.1 10e3/uL Final  . NEUT% 07/12/2015 76.3  38.4 - 76.8 % Final  . LYMPH% 07/12/2015 11.6* 14.0 - 49.7 % Final  . MONO% 07/12/2015 10.1  0.0 - 14.0 % Final  . EOS% 07/12/2015 1.5  0.0 - 7.0 % Final  . BASO% 07/12/2015 0.5  0.0 - 2.0 % Final  . Sodium 07/12/2015 142  136 - 145 mEq/L Final  . Potassium 07/12/2015 3.7  3.5 - 5.1 mEq/L Final  . Chloride 07/12/2015 116* 98 - 109 mEq/L Final  . CO2 07/12/2015 17* 22 - 29 mEq/L Final  . Glucose 07/12/2015 112  70 - 140 mg/dl Final   Glucose reference range is for nonfasting patients. Fasting glucose reference range is 70- 100.  Marland Kitchen BUN 07/12/2015 33.3* 7.0 - 26.0 mg/dL Final  . Creatinine 07/12/2015 3.3* 0.6 - 1.1 mg/dL Final  . Total Bilirubin 07/12/2015 0.47  0.20 - 1.20 mg/dL Final  . Alkaline Phosphatase 07/12/2015 124  40 - 150 U/L Final  . AST 07/12/2015 14  5 - 34 U/L Final  . ALT 07/12/2015 14  0 - 55 U/L Final  . Total Protein 07/12/2015 6.3* 6.4 - 8.3 g/dL Final  . Albumin 07/12/2015 3.5  3.5 - 5.0 g/dL Final  . Calcium 07/12/2015 8.7  8.4 - 10.4 mg/dL Final  . Anion Gap 07/12/2015 9  3 - 11 mEq/L Final  . EGFR 07/12/2015 13* >90 ml/min/1.73 m2 Final   eGFR is calculated using the CKD-EPI Creatinine Equation (2009)     RADIOGRAPHIC STUDIES: Ct Abdomen Pelvis Wo Contrast  07/10/2015  CLINICAL DATA:  Right groin pain, status post hysterectomy, status postcholecystectomy, history of multiple myeloma, stage 4 chronic kidney disease EXAM: CT ABDOMEN AND PELVIS WITHOUT CONTRAST TECHNIQUE: Multidetector CT imaging of the abdomen and pelvis was performed following the standard protocol without IV contrast. COMPARISON:  CT 11/23/2013 FINDINGS: Lower chest: 3 mm nodule in right middle lobe anteriorly is stable in size in appearance from prior exam. Left breast implant is noted. Hepatobiliary: Unenhanced liver shows stable mild intrahepatic biliary ductal dilatation. The patient is status post cholecystectomy. Pancreas: Unenhanced pancreas is unremarkable. Spleen: Unenhanced  spleen is unremarkable. Adrenals/Urinary Tract: No right adrenal mass. Stable low-density nodule probable adenoma left adrenal gland measures 1.3 cm. Again noted bilateral renal cortical thinning probable due to atrophy. There is mild dilatation of left extrarenal collecting system. No hydronephrosis or hydroureter. No calcified ureteral calculi are noted bilaterally. No calcified calculi are noted within urinary bladder. Stomach/Bowel: Small hiatal hernia is noted. There is no gastric outlet obstruction. Mild distended small bowel loops are noted in distal small bowel mid pelvis just above the urinary bladder with some fecal like material. The terminal ileum is somewhat smaller caliber without clear transition point identified. Findings may be due to mild distal small bowel ileus or early small bowel obstruction. Clinical correlation is necessary. There is no pericecal inflammation. The appendix is not identified. Some colonic gas noted in transverse colon. Some colonic stool noted within descending colon and sigmoid colon. There is no evidence of distal colitis or diverticulitis. No distal colonic obstruction. Vascular/Lymphatic: Mild atherosclerotic  calcifications of abdominal aorta and iliac arteries. No aortic aneurysm. No retroperitoneal or mesenteric adenopathy. Reproductive: The patient is status post hysterectomy. No pelvic ascites. Other: No ascites or free air. No inguinal adenopathy. No inguinal hernia. Musculoskeletal: No destructive bony lesions are noted. Sagittal images of the spine shows mild disc space flattening with mild posterior disc bulge at L3-L4 and L4-L5 level. There is significant disc space flattening with endplate sclerotic changes vacuum disc phenomenon mild anterior spurring at L5-S1 level. Facet degenerative changes noted L5 level. Mild dextroscoliosis lumbar spine. IMPRESSION: 1. There is mild segmental dilatation of distal small bowel in mid pelvis just above the urinary bladder with some fecal like material. Early is small bowel obstruction or ileus cannot be excluded. There is no transition point in caliber of small bowel. 2. No pericecal inflammation.  The appendix is not identified. 3. No hydronephrosis or hydroureter. Bilateral renal cortical thinning probable due to atrophy. 4. Stable left adrenal adenoma. 5. No evidence of colitis or diverticulitis. No evidence of distal colonic obstruction. 6. Degenerative changes lumbar spine as described above. Electronically Signed   By: Lahoma Crocker M.D.   On: 07/10/2015 16:17   Dg Hip Unilat With Pelvis 2-3 Views Right  07/12/2015  CLINICAL DATA:  Pain in the right groin developing 3 days ago and extending into the distal right femur. EXAM: DG HIP (WITH OR WITHOUT PELVIS) 2-3V RIGHT COMPARISON:  None. FINDINGS: No evidence of hip fracture or subluxation. History of multiple myeloma. No myeloma focus seen in the proximal femora or pelvic ring. No notable hip degeneration. There is lower lumbar facet arthropathy and L5-S1 disc degeneration. IMPRESSION: No explanation for acute pain.  No visible myelomatous focus. Electronically Signed   By: Monte Fantasia M.D.   On: 07/12/2015 14:02    Dg Femur, Min 2 Views Right  07/12/2015  CLINICAL DATA:  Pain in the right groin developing 3 days ago and extending into the distal right femur. EXAM: RIGHT FEMUR 2 VIEWS COMPARISON:  None. FINDINGS: There is no evidence of fracture or other focal bone lesions. Soft tissues are unremarkable. IMPRESSION: Negative. Electronically Signed   By: Monte Fantasia M.D.   On: 07/12/2015 14:03    ASSESSMENT/PLAN:    Right hip pain Patient states that she began experiencing some right hip/groin, right thigh and right knee pain a few days ago.  She denies any new trauma or injury.  She denies any falls.  She states that she presented to the emergency department over the weekend  on 07/10/2015 for further evaluation of this pain.  She underwent a CT of the abdomen/pelvis without contrast while in the emergency department; with no acute findings that could be causing this pain.  She was found to have some degenerative changes noted at the L5 level.  Patient states that she received a Marcaine injection to the right hip region; which did nothing to relieve the pain.  She states that she has been taking tramadol at home; as well as a few ibuprofen.  She states she has allergic reactions to most other narcotic pain medications.  Exam today revealed no obvious pain with palpation to the right hip or groin region.   patient had no pain to the right right region; but did have some discomfort when the right anterior knee was palpated.  Patient was observed with full range of motion; but patient states she does occasionally limp due to the pain.  Also of note-there were no masses or other known injury on exam.  There were also no rashes noted.  Patient was given morphine 2 mg IV while at the cancer center; this did help relieve her pain.  Patient was also prescribed lidocaine patches to apply to the painful sites to see if this helps as well.  She was also advised to continue taking her tramadol as previously  directed.  Patient also obtained a right hip, right femur, and a right knee plain film x-rays; which were all negative for any acute findings.  Nurse attempted to call the patient to review the x-ray results; but there was no answer and voicemail was left.  The plan is to follow back up with the patient today to review the x-ray results.  Have reviewed these findings with Dr. Julien Nordmann; and patient may need to consider a orthopedic referral if the pain continues.   Multiple myeloma [facility]) Patient presented to the [facility] today to receive cycle 8, day 1 of her cyclophosphamide/carfilzomib chemotherapy regimen.  Patient states she's feeling fairly well; with the exception of her right hip to knee pain.  She denies any recent fevers or chills.  Labs obtained today were essentially normal; with the exception of a creatinine which remained elevated at 3.3.  Patient has a history of chronic renal disease; and is managed per her nephrologist.  Reviewed all lab results with Dr. Julien Nordmann; and patient was cleared to proceed with chemotherapy today.  Patient will return tomorrow for cycle 8, day 2 of her chemotherapy plan.  She will return on 07/20/2015 for labs and chemotherapy.  CKD (chronic kidney disease) stage 5, GFR less than 15 ml/min (HCC) Patient has known chronic renal disease; it is managed per her nephrologist.   Patient stated understanding of all instructions; and was in agreement with this plan of care. The patient knows to call the clinic with any problems, questions or concerns.   Total time spent with patient was 40 minutes;  with greater than 75 percent of that time spent in face to face counseling regarding patient's symptoms,  and coordination of care and follow up.  Disclaimer:This dictation was prepared with Dragon/digital dictation along with [employer]. Any transcriptional errors that result from this process are unintentional.  Drue Second, NP 07/13/2015

## 2015-07-14 ENCOUNTER — Other Ambulatory Visit: Payer: Self-pay | Admitting: Nurse Practitioner

## 2015-07-14 ENCOUNTER — Other Ambulatory Visit: Payer: Self-pay | Admitting: Internal Medicine

## 2015-07-14 ENCOUNTER — Other Ambulatory Visit: Payer: Self-pay | Admitting: *Deleted

## 2015-07-14 DIAGNOSIS — M25561 Pain in right knee: Secondary | ICD-10-CM | POA: Diagnosis not present

## 2015-07-14 MED ORDER — TRAMADOL HCL 50 MG PO TABS: ORAL_TABLET | ORAL | Status: DC

## 2015-07-15 ENCOUNTER — Telehealth: Payer: Self-pay | Admitting: *Deleted

## 2015-07-15 NOTE — Telephone Encounter (Signed)
Called pt this am to f/u from visit w/ [facility]. Pt denies pain to abdomen.Pt had mild nausea this morning but was resolved with eating dry toast. Loose stools since she's been on antibiotics. Took Imodium this am, so far only 2 stools today. Encouraged pt to continue to drink plenty of fluids, and she's doing that already.[name] only complaint of pain is to the rt knee, went to orthopedic doctor yesterday,dx with acute tendonitis, prescribed a topical cream,Pennsaid and wearing a brace. Pt says she is hardly using any Tramadol but has some. She took Tylenol 500 mg x 2 instead. Temp is 99.2 as of 11:13 this am. Pt verbalized understanding. No further concerns. Message to be fwd to C. Bacon,NP.

## 2015-07-16 ENCOUNTER — Other Ambulatory Visit: Payer: Self-pay | Admitting: Internal Medicine

## 2015-07-16 DIAGNOSIS — C9000 Multiple myeloma not having achieved remission: Secondary | ICD-10-CM

## 2015-07-16 MED ORDER — PILOCARPINE HCL 5 MG PO TABS: ORAL_TABLET | ORAL | Status: DC

## 2015-07-18 ENCOUNTER — Other Ambulatory Visit: Payer: Self-pay | Admitting: *Deleted

## 2015-07-18 DIAGNOSIS — C9000 Multiple myeloma not having achieved remission: Secondary | ICD-10-CM

## 2015-07-20 ENCOUNTER — Ambulatory Visit (HOSPITAL_BASED_OUTPATIENT_CLINIC_OR_DEPARTMENT_OTHER): Payer: Medicare Other

## 2015-07-20 ENCOUNTER — Ambulatory Visit: Payer: Medicare Other

## 2015-07-20 ENCOUNTER — Ambulatory Visit (HOSPITAL_COMMUNITY)
Admission: RE | Admit: 2015-07-20 | Discharge: 2015-07-20 | Disposition: A | Discharge status: Home / Self Care | Payer: Medicare Other | Source: Ambulatory Visit | Attending: Internal Medicine | Admitting: Internal Medicine

## 2015-07-20 ENCOUNTER — Other Ambulatory Visit: Payer: Self-pay | Admitting: *Deleted

## 2015-07-20 ENCOUNTER — Other Ambulatory Visit (HOSPITAL_BASED_OUTPATIENT_CLINIC_OR_DEPARTMENT_OTHER): Payer: Medicare Other

## 2015-07-20 VITALS — BP 126/52 | HR 62 | Temp 98.2°F | Resp 17

## 2015-07-20 DIAGNOSIS — C9000 Multiple myeloma not having achieved remission: Secondary | ICD-10-CM

## 2015-07-20 DIAGNOSIS — D649 Anemia, unspecified: Secondary | ICD-10-CM

## 2015-07-20 DIAGNOSIS — Z95828 Presence of other vascular implants and grafts: Secondary | ICD-10-CM

## 2015-07-20 LAB — CBC WITH DIFFERENTIAL/PLATELET
BASO%: 0.3 % (ref 0.0–2.0)
Basophils Absolute: 0 10*3/uL (ref 0.0–0.1)
EOS ABS: 0.2 10*3/uL (ref 0.0–0.5)
EOS%: 3.1 % (ref 0.0–7.0)
HEMATOCRIT: 24.6 % — AB (ref 34.8–46.6)
HGB: 7.9 g/dL — ABNORMAL LOW (ref 11.6–15.9)
LYMPH%: 19 % (ref 14.0–49.7)
MCH: 32.5 pg (ref 25.1–34.0)
MCHC: 32.1 g/dL (ref 31.5–36.0)
MCV: 101.2 fL — AB (ref 79.5–101.0)
MONO#: 0.8 10*3/uL (ref 0.1–0.9)
MONO%: 10.9 % (ref 0.0–14.0)
NEUT#: 4.6 10*3/uL (ref 1.5–6.5)
NEUT%: 66.7 % (ref 38.4–76.8)
PLATELETS: 67 10*3/uL — AB (ref 145–400)
RBC: 2.43 10*6/uL — ABNORMAL LOW (ref 3.70–5.45)
RDW: 15.7 % — ABNORMAL HIGH (ref 11.2–14.5)
WBC: 6.9 10*3/uL (ref 3.9–10.3)
lymph#: 1.3 10*3/uL (ref 0.9–3.3)
nRBC: 0 % (ref 0–0)

## 2015-07-20 LAB — COMPREHENSIVE METABOLIC PANEL
ALT: 41 U/L (ref 0–55)
ANION GAP: 12 meq/L — AB (ref 3–11)
AST: 35 U/L — ABNORMAL HIGH (ref 5–34)
Albumin: 3.4 g/dL — ABNORMAL LOW (ref 3.5–5.0)
Alkaline Phosphatase: 119 U/L (ref 40–150)
BUN: 56.6 mg/dL — ABNORMAL HIGH (ref 7.0–26.0)
CALCIUM: 9.7 mg/dL (ref 8.4–10.4)
CHLORIDE: 114 meq/L — AB (ref 98–109)
CO2: 16 mEq/L — ABNORMAL LOW (ref 22–29)
CREATININE: 5.1 mg/dL — AB (ref 0.6–1.1)
EGFR: 8 mL/min/{1.73_m2} — AB (ref >90)
Glucose: 92 mg/dl (ref 70–140)
POTASSIUM: 4.3 meq/L (ref 3.5–5.1)
Sodium: 141 mEq/L (ref 136–145)
Total Bilirubin: 1.06 mg/dL (ref 0.20–1.20)
Total Protein: 6.1 g/dL — ABNORMAL LOW (ref 6.4–8.3)

## 2015-07-20 MED ORDER — HEPARIN SOD (PORK) LOCK FLUSH 100 UNIT/ML IV SOLN
500.0000 [IU] | Freq: Once | INTRAVENOUS | Status: AC
Start: 2015-07-20 — End: 2015-07-20
  Administered 2015-07-20: 500 [IU] via INTRAVENOUS
  Filled 2015-07-20: qty 5

## 2015-07-20 MED ORDER — SODIUM CHLORIDE 0.9% FLUSH
10.0000 mL | INTRAVENOUS | Status: DC | PRN
  Administered 2015-07-20: 10 mL via INTRAVENOUS
  Filled 2015-07-20: qty 10

## 2015-07-20 NOTE — Patient Instructions (Signed)

## 2015-07-20 NOTE — Progress Notes (Signed)
Pt reports that in the last week she has began to experience dizziness when walking, and blurred vision. Pt also reports that the shaking of her hands (primarily the left hand) has worsened over the last few months. HGB 7.9 Platelets 67 BUN 56.6 Creatinine 5.1. Reviewed labs and symptoms with Dr. Julien Nordmann, per Dr. Julien Nordmann hold treatment today and 07/21/15 and resume treatment next week. Pt to receive 2 units of blood this week per Dr. Julien Nordmann. Mary (Dr. Worthy Flank nurse) aware and arrangements made for pt to receive 2 units of PRBCs on 07/21/15 at [facility]. Pt aware of plan and verbalizes understanding. Port accessed to draw labs for a type and cross. Port de assessed  and blood work sent to lab via tube station after being verified by two RNs. Pt educated to keep blue band on until after she receives blood transfusion, pt verbalizes understanding. Pt in stable condition at time of discharge.

## 2015-07-20 NOTE — Progress Notes (Signed)
Pt will have 2 units blood at [facility] 7/6 9am. Pt will be T&C today. Har called in. Blood orders entered.

## 2015-07-21 ENCOUNTER — Ambulatory Visit: Payer: Medicare Other

## 2015-07-21 ENCOUNTER — Other Ambulatory Visit: Payer: Self-pay | Admitting: *Deleted

## 2015-07-21 ENCOUNTER — Ambulatory Visit (HOSPITAL_COMMUNITY)
Admission: RE | Admit: 2015-07-21 | Discharge: 2015-07-21 | Disposition: A | Discharge status: Home / Self Care | Payer: Medicare Other | Source: Ambulatory Visit | Attending: Internal Medicine | Admitting: Internal Medicine

## 2015-07-21 ENCOUNTER — Telehealth: Payer: Self-pay | Admitting: *Deleted

## 2015-07-21 VITALS — BP 133/62 | HR 66 | Temp 98.1°F | Resp 18

## 2015-07-21 DIAGNOSIS — D649 Anemia, unspecified: Secondary | ICD-10-CM | POA: Diagnosis not present

## 2015-07-21 DIAGNOSIS — K219 Gastro-esophageal reflux disease without esophagitis: Secondary | ICD-10-CM

## 2015-07-21 DIAGNOSIS — C9000 Multiple myeloma not having achieved remission: Secondary | ICD-10-CM

## 2015-07-21 LAB — PREPARE RBC (CROSSMATCH)

## 2015-07-21 MED ORDER — DIPHENHYDRAMINE HCL 25 MG PO CAPS
25.0000 mg | ORAL_CAPSULE | Freq: Once | ORAL | Status: AC
  Administered 2015-07-21: 25 mg via ORAL
  Filled 2015-07-21: qty 1

## 2015-07-21 MED ORDER — SODIUM CHLORIDE 0.9% FLUSH
10.0000 mL | INTRAVENOUS | Status: AC | PRN
  Administered 2015-07-21: 10 mL

## 2015-07-21 MED ORDER — MIDODRINE HCL 10 MG PO TABS: ORAL_TABLET | ORAL | Status: DC

## 2015-07-21 MED ORDER — SODIUM CHLORIDE 0.9 % IV SOLN
250.0000 mL | Freq: Once | INTRAVENOUS | Status: AC
  Administered 2015-07-21: 250 mL via INTRAVENOUS

## 2015-07-21 MED ORDER — HEPARIN SOD (PORK) LOCK FLUSH 100 UNIT/ML IV SOLN: 250.0000 [IU] | INTRAVENOUS | Status: DC | PRN

## 2015-07-21 MED ORDER — SODIUM CHLORIDE 0.9% FLUSH: 3.0000 mL | INTRAVENOUS | Status: DC | PRN

## 2015-07-21 MED ORDER — ACETAMINOPHEN 325 MG PO TABS
650.0000 mg | ORAL_TABLET | Freq: Once | ORAL | Status: AC
  Administered 2015-07-21: 650 mg via ORAL
  Filled 2015-07-21: qty 2

## 2015-07-21 MED ORDER — HEPARIN SOD (PORK) LOCK FLUSH 100 UNIT/ML IV SOLN
500.0000 [IU] | Freq: Every day | INTRAVENOUS | Status: AC | PRN
  Administered 2015-07-21: 500 [IU]
  Filled 2015-07-21 (×2): qty 5

## 2015-07-21 MED ORDER — ISOSORBIDE MONONITRATE ER 30 MG PO TB24: 30.0000 mg | ORAL_TABLET | Freq: Every day | ORAL | Status: DC

## 2015-07-21 MED ORDER — ONDANSETRON 8 MG PO TBDP: 8.0000 mg | ORAL_TABLET | Freq: Three times a day (TID) | ORAL | Status: DC | PRN

## 2015-07-21 MED ORDER — MELOXICAM 15 MG PO TABS: 15.0000 mg | ORAL_TABLET | Freq: Every day | ORAL | Status: DC

## 2015-07-21 MED ORDER — RANITIDINE HCL 300 MG PO TABS: ORAL_TABLET | ORAL | Status: DC

## 2015-07-21 NOTE — Telephone Encounter (Signed)
Patient would like zofran sent to her pharmacy [facility]). Message sent to [facility].

## 2015-07-21 NOTE — Discharge Instructions (Signed)
Blood Transfusion, Care After Refer to this sheet in the next few weeks. These instructions provide you with information about caring for yourself after your procedure. Your health care provider may also give you more specific instructions. Your treatment has been planned according to current medical practices, but problems sometimes occur. Call your health care provider if you have any problems or questions after your procedure. WHAT TO EXPECT AFTER THE PROCEDURE After your procedure, it is common to have:  Bruising and soreness at the IV site.  Chills or fever.  Headache. HOME CARE INSTRUCTIONS  Take medicines only as directed by your health care provider. Ask your health care provider if you can take an over-the-counter pain reliever in case you have a fever or headache a day or two after your transfusion.  Return to your normal activities as directed by your health care provider. SEEK MEDICAL CARE IF:   You develop redness or irritation at your IV site.  You have persistent fever, chills, or headache.  Your urine is darker than normal.  Your urine turns pink, red, or brown.   The white part of your eye turns yellow (jaundice).   You feel weak after doing your normal activities.  SEEK IMMEDIATE MEDICAL CARE IF:   You have trouble breathing.  You have fever and chills along with:  Anxiety.  Chest or back pain.  Flushed skin.  Clammy skin.  A rapid heartbeat.  Nausea.   This information is not intended to replace advice given to you by your health care provider. Make sure you discuss any questions you have with your health care provider.   Document Released: 01/22/2014 Document Reviewed: 01/22/2014 Elsevier Interactive Patient Education 2016 [place].  Blood Transfusion  A blood transfusion is a procedure in which you receive donated blood through an IV tube. You may need a blood transfusion because of illness, surgery, or injury. The blood may come from a  donor, or it may be your own blood that you donated previously. The blood given in a transfusion is made up of different types of cells. You may receive:  Red blood cells. These carry oxygen and replace lost blood.  Platelets. These control bleeding.  Plasma. Thishelps blood to clot. If you have hemophilia or another clotting disorder, you may also receive other types of blood products. LET [facility] CARE PROVIDER KNOW ABOUT:  Any allergies you have.  All medicines you are taking, including vitamins, herbs, eye drops, creams, and over-the-counter medicines.  Previous problems you or members of your family have had with the use of anesthetics.  Any blood disorders you have.  Previous surgeries you have had.  Any medical conditions you may have.  Any previous reactions you have had during a blood transfusion.  RISKS AND COMPLICATIONS Generally, this is a safe procedure. However, problems may occur, including:  Having an allergic reaction to something in the donated blood.  Fever. This may be a reaction to the white blood cells in the transfused blood.  Iron overload. This can happen from having many transfusions.  Transfusion-related acute lung injury (TRALI). This is a rare reaction that causes lung damage. The cause is not known.TRALI can occur within hours of a transfusion or several days later.  Sudden (acute) or delayed hemolytic reactions. This happens if your blood does not match the cells in your transfusion. Your body's defense system (immune system) may try to attack the new cells. This complication is rare.  Infection. This is rare. BEFORE THE  PROCEDURE  You may have a blood test to determine your blood type. This is necessary to know what kind of blood your body will accept.  If you are going to have a planned surgery, you may donate your own blood. This may be done in case you need to have a transfusion.  If you have had an allergic reaction to a  transfusion in the past, you may be given medicine to help prevent a reaction. Take this medicine only as directed by your health care provider.  You will have your temperature, blood pressure, and pulse monitored before the transfusion. PROCEDURE   An IV will be started in your hand or arm.  The bag of donated blood will be attached to your IV tube and given into your vein.  Your temperature, blood pressure, and pulse will be monitored regularly during the transfusion. This monitoring is done to detect early signs of a transfusion reaction.  If you have any signs or symptoms of a reaction, your transfusion will be stopped and you may be given medicine.  When the transfusion is over, your IV will be removed.  Pressure may be applied to the IV site for a few minutes.  A bandage (dressing) will be applied. The procedure may vary among health care providers and hospitals. AFTER THE PROCEDURE  Your blood pressure, temperature, and pulse will be monitored regularly.   This information is not intended to replace advice given to you by your health care provider. Make sure you discuss any questions you have with your health care provider.   Document Released: 12/30/1999 Document Revised: 01/22/2014 Document Reviewed: 11/11/2013 Elsevier Interactive Patient Education [employer].

## 2015-07-21 NOTE — Telephone Encounter (Signed)
Rx sent to pt pharmacy 

## 2015-07-21 NOTE — Progress Notes (Signed)
Diagnosis Association: Symptomatic anemia (285.9)  Provider: Dr. Julien Nordmann  Procedure: Pt received 2 units of PRBCs via porta cath.  Pt tolerated procedure well.  Post procedure: Pt alert, oreinted and ambulatory at discharge. Porta cath flushed per protocol.

## 2015-07-21 NOTE — Addendum Note (Signed)
Addended by: Lucile Crater on: 07/21/2015 10:37 AM   Modules accepted: Orders

## 2015-07-22 ENCOUNTER — Other Ambulatory Visit: Payer: Self-pay | Admitting: *Deleted

## 2015-07-22 DIAGNOSIS — C9000 Multiple myeloma not having achieved remission: Secondary | ICD-10-CM

## 2015-07-22 DIAGNOSIS — K219 Gastro-esophageal reflux disease without esophagitis: Secondary | ICD-10-CM

## 2015-07-22 LAB — TYPE AND SCREEN
ABO/RH(D): O POS
Antibody Screen: NEGATIVE
UNIT DIVISION: 0
Unit division: 0

## 2015-07-22 MED ORDER — TRAMADOL HCL 50 MG PO TABS: ORAL_TABLET | ORAL | Status: DC

## 2015-07-22 MED ORDER — PANTOPRAZOLE SODIUM 40 MG PO TBEC: DELAYED_RELEASE_TABLET | ORAL | Status: DC

## 2015-07-22 MED ORDER — ONDANSETRON 8 MG PO TBDP: 8.0000 mg | ORAL_TABLET | Freq: Three times a day (TID) | ORAL | Status: DC | PRN

## 2015-07-22 MED ORDER — LEVOTHYROXINE SODIUM 150 MCG PO TABS: 150.0000 ug | ORAL_TABLET | Freq: Every day | ORAL | Status: DC

## 2015-07-22 MED ORDER — FERROUS SULFATE 325 (65 FE) MG PO TABS
ORAL_TABLET | ORAL | Status: DC
Start: 2015-07-22 — End: 2017-03-15

## 2015-07-22 MED ORDER — GABAPENTIN 300 MG PO CAPS
ORAL_CAPSULE | ORAL | Status: DC
Start: 2015-07-22 — End: 2016-10-08

## 2015-07-22 MED ORDER — BUPROPION HCL ER (XL) 300 MG PO TB24: 300.0000 mg | ORAL_TABLET | Freq: Every day | ORAL | Status: DC

## 2015-07-22 MED ORDER — CITALOPRAM HYDROBROMIDE 40 MG PO TABS: ORAL_TABLET | ORAL | Status: DC

## 2015-07-22 MED ORDER — PILOCARPINE HCL 5 MG PO TABS: ORAL_TABLET | ORAL | Status: AC

## 2015-07-23 ENCOUNTER — Ambulatory Visit (HOSPITAL_COMMUNITY)
Admission: EM | Admit: 2015-07-23 | Discharge: 2015-07-23 | Disposition: A | Discharge status: Home / Self Care | Payer: Medicare Other | Attending: Family Medicine | Admitting: Family Medicine

## 2015-07-23 ENCOUNTER — Encounter (HOSPITAL_COMMUNITY): Payer: Self-pay | Admitting: *Deleted

## 2015-07-23 DIAGNOSIS — S0093XA Contusion of unspecified part of head, initial encounter: Secondary | ICD-10-CM | POA: Diagnosis not present

## 2015-07-23 NOTE — ED Notes (Signed)
Pt  Reports  She    Lost her  Balance       And  [place]    When  She  Was  Stepping  Off  The  Curb  Today          she  States  She  Struck  The  Back of  He head       She  Is  Awake  And  Alert  And  Oriented     Her  pearla        She  Has  A  Shunt in   l arm        For  predialsis

## 2015-07-23 NOTE — ED Provider Notes (Signed)
CSN: [id number]     Arrival date & time 07/23/15  1324 History   First MD Initiated Contact with Patient 07/23/15 1406     Chief Complaint  Patient presents with  . Fall   (Consider location/radiation/quality/duration/timing/severity/associated sxs/prior Treatment) Patient is a 75 y.o. female presenting with fall. The history is provided by the patient.  Fall This is a new problem. The current episode started 1 to 2 hours ago (tripped stepping off curb when heel caught, fell against car then hit back of head on road, no loc or neuro sx.). The problem has not changed since onset.Pertinent negatives include no chest pain, no abdominal pain and no headaches.    Past Medical History  Diagnosis Date  . Hyperkalemia   . Hypothyroidism   . COPD (chronic obstructive pulmonary disease) ([place])   . Hyponatremia   . Dizziness   . Fibromyalgia   . Breast cancer ([place])   . Multiple myeloma   . Mucositis   . Hx of echocardiogram     a.  Echocardiogram (12/26/2012): EF 76-22%, grade 1 diastolic dysfunction;   b.  Echocardiogram (02/2013): EF 55-60%, no WMA, trivial effusion  . Fibromyalgia   . Arthritis   . Hx of cardiovascular stress test     LexiScan with low level exercise Myoview (02/2013): No ischemia, EF 72%; normal study  . Complication of anesthesia   . PONV (postoperative nausea and vomiting) 2008    after mastestomy  . Myocardial infarction [facility]     in past, patient was unaware.   . Anginal pain ([place])     used NTG x 2 May 31 and 06/15/13   . GERD (gastroesophageal reflux disease)   . Headache(784.0)   . Anxiety   . Depression   . CKD (chronic kidney disease) stage 3, GFR 30-59 ml/min   . Anemia   . History of blood transfusion     last one May 12   . Neuropathy ([place])   . B12 deficiency 12/04/2014  . Pain in joint, pelvic region and thigh 07/07/2015   Past Surgical History  Procedure Laterality Date  . History of port removal    . Status post stem cell transplant on September 28, 2008.    Marland Kitchen Abdominal hysterectomy  1981  . Cholecystectomy  1971  . Mastectomy Left 2008  . Cataract extraction, bilateral    . Breast surgery    . Eye surgery Bilateral     lens implant  . Breast reconstruction    . Portacath placement  12/2012    has had 2  . Av fistula placement Left 06/19/2013    Procedure: CREATION OF LEFT ARM ARTERIOVENOUS (AV) FISTULA ;  Surgeon: Angelia Mould, MD;  Location: [facility] OR;  Service: Vascular;  Laterality: Left;   Family History  Problem Relation Age of Onset  . Arthritis Mother   . Asthma Mother   . Cancer Sister   . Hyperlipidemia Brother    Social History  Substance Use Topics  . Smoking status: Former Smoker -- 1.00 packs/day for 30 years    Types: Cigarettes    Quit date: 02/15/2005  . Smokeless tobacco: Never Used  . Alcohol Use: No   OB History    No data available     Review of Systems  Constitutional: Negative.   Cardiovascular: Negative for chest pain.  Gastrointestinal: Negative for abdominal pain.  Musculoskeletal: Negative for back pain and gait problem.  Skin: Negative.   Neurological: Negative for dizziness,  seizures, syncope, speech difficulty, weakness, numbness and headaches.  All other systems reviewed and are negative.   Allergies  Codeine; Latex; Other; Onion; Zyprexa; Adhesive; Hydrocodone; Iodinated diagnostic agents; and Sulfa antibiotics  Home Medications   Prior to Admission medications   Medication Sig Start Date End Date Taking? Authorizing Provider  acetaminophen (TYLENOL) 325 MG tablet Take 650 mg by mouth every 6 (six) hours as needed for mild pain or moderate pain.     Historical Provider, MD  albuterol (PROVENTIL HFA;VENTOLIN HFA) 108 (90 Base) MCG/ACT inhaler Inhale 1-2 puffs into the lungs every 6 (six) hours as needed for wheezing or shortness of breath (cough). 02/25/15   Courtney Forcucci, PA-C  ANORO ELLIPTA 62.5-25 MCG/INH AEPB  02/25/15   Historical Provider, MD  ascorbic acid  (VITAMIN C) 250 MG CHEW Chew 250 mg by mouth daily.    Historical Provider, MD  azelastine (ASTELIN) 0.1 % nasal spray Place 2 sprays into both nostrils 2 (two) times daily. Use in each nostril as directed 02/25/15 02/25/16  Courtney Forcucci, PA-C  buPROPion (WELLBUTRIN XL) 300 MG 24 hr tablet Take 1 tablet (300 mg total) by mouth daily. 07/22/15   [name], MD  carboxymethylcellulose (REFRESH PLUS) 0.5 % SOLN Place 1 drop into both eyes every 2 (two) hours.    Historical Provider, MD  Cholecalciferol 4000 UNITS TABS Take 4,000 Units by mouth daily.    Historical Provider, MD  ciprofloxacin (CIPRO) 250 MG tablet Take 1 tablet (250 mg total) by mouth 2 (two) times daily. 07/13/15   Susanne Borders, NP  citalopram (CELEXA) 40 MG tablet Take 1 tablet daily or as directed 07/22/15 01/21/16  [name], MD  cromolyn (OPTICROM) 4 % ophthalmic solution Place 1 drop into both eyes 4 (four) times daily.    Historical Provider, MD  ferrous sulfate 325 (65 FE) MG tablet Take 1 tablet daily or as directed 07/22/15 01/21/16  [name], MD  gabapentin (NEURONTIN) 300 MG capsule TAKE 1 CAPSULE BY MOUTH AT BEDTIME. 07/22/15   [name], MD  isosorbide mononitrate (IMDUR) 30 MG 24 hr tablet Take 1 tablet (30 mg total) by mouth daily. 07/21/15   [name], MD  levothyroxine (SYNTHROID, LEVOTHROID) 150 MCG tablet Take 1 tablet (150 mcg total) by mouth daily. 07/22/15   [name], MD  lidocaine (LIDODERM) 5 % Place 1 patch onto the skin daily. Remove & Discard patch within 12 hours or as directed by MD 07/12/15   Susanne Borders, NP  loperamide (IMODIUM) 2 MG capsule Take two tabs po initially, then one tab after each loose stool: max 8 tabs in 24 hours 08/13/14   Courtney Forcucci, PA-C  loratadine (CLARITIN) 10 MG tablet Take 10 mg by mouth daily.    Historical Provider, MD  LORazepam (ATIVAN) 0.5 MG tablet Take 1 tablet (0.5 mg total) by mouth every 8 (eight) hours as needed (nausea). 08/18/14   Curt Bears, MD  loteprednol (ALREX) 0.2 % SUSP Place 1 drop into both eyes 4 (four) times daily. 12/08/13   Vicie Mutters, PA-C  meloxicam (MOBIC) 15 MG tablet Take 1 tablet (15 mg total) by mouth daily. 07/21/15   [name], MD  midodrine (PROAMATINE) 10 MG tablet TAKE 1 TABLET BY MOUTH 3 TIMES DAILY. 07/21/15   [name], MD  nitroGLYCERIN (NITROSTAT) 0.4 MG SL tablet Place 0.4 mg under the tongue every 5 (five) minutes as needed for chest pain. Reported on 03/30/2015    Historical Provider, MD  ondansetron (ZOFRAN-ODT) 8 MG disintegrating tablet Take 1 tablet (8 mg total) by mouth every 8 (eight) hours as needed for nausea or vomiting. 07/22/15   [name], MD  pantoprazole (PROTONIX) 40 MG tablet Take 1 tablet daily for Acid reflux & indigestion 07/22/15 01/19/16  [name], MD  pilocarpine (SALAGEN) 5 MG tablet Take 1 tablet 3 x / day for dry mouth. 07/22/15 01/22/16  [name], MD  ranitidine (ZANTAC) 300 MG tablet Take 1 to 2 tablets daily for heartburn & reflux to allow wean and transition from PPI / pantoprazole 07/21/15   [name], MD  senna (SENOKOT) 8.6 MG tablet Take 1 tablet by mouth at bedtime.    Historical Provider, MD  traMADol (ULTRAM) 50 MG tablet Take 1 to 2 tablets by mouth 4 times a day as needed for pain. 07/22/15   [name], MD   Meds Ordered and Administered this Visit  Medications - No data to display  BP 126/84 mmHg  Pulse 80  Temp(Src) 98.6 F (37 C) (Oral)  Resp 16  SpO2 100% No data found.   Physical Exam  Constitutional: She is oriented to person, place, and time. She appears well-developed and well-nourished.  HENT:  Right Ear: External ear normal.  Left Ear: External ear normal.  Nose: Nose normal.  Mouth/Throat: Oropharynx is clear and moist.  occip sts , no lac  Eyes: EOM are normal. Pupils are equal, round, and reactive to light.  Neck: Normal range of motion. Neck supple.  Pulmonary/Chest: Breath sounds normal.   Musculoskeletal: Normal range of motion. She exhibits no tenderness.  Neurological: She is alert and oriented to person, place, and time. No cranial nerve deficit. Coordination normal.  Skin: Skin is warm and dry.  Nursing note and vitals reviewed.   ED Course  Procedures (including critical care time)  Labs Review Labs Reviewed - No data to display  Imaging Review No results found.   Visual Acuity Review  Right Eye Distance:   Left Eye Distance:   Bilateral Distance:    Right Eye Near:   Left Eye Near:    Bilateral Near:         MDM   1. Head contusion, initial encounter        Billy Fischer, MD 07/23/15 [phone]

## 2015-07-23 NOTE — Discharge Instructions (Signed)
Ice pack today and tylenol. Return to ER if any change of symptoms

## 2015-07-26 ENCOUNTER — Ambulatory Visit (HOSPITAL_BASED_OUTPATIENT_CLINIC_OR_DEPARTMENT_OTHER): Payer: Medicare Other

## 2015-07-26 ENCOUNTER — Other Ambulatory Visit: Payer: Self-pay | Admitting: Medical Oncology

## 2015-07-26 ENCOUNTER — Other Ambulatory Visit (HOSPITAL_BASED_OUTPATIENT_CLINIC_OR_DEPARTMENT_OTHER): Payer: Medicare Other

## 2015-07-26 VITALS — BP 133/58 | HR 62 | Temp 99.0°F | Resp 17

## 2015-07-26 DIAGNOSIS — C9000 Multiple myeloma not having achieved remission: Secondary | ICD-10-CM | POA: Diagnosis present

## 2015-07-26 DIAGNOSIS — Z5112 Encounter for antineoplastic immunotherapy: Secondary | ICD-10-CM | POA: Diagnosis not present

## 2015-07-26 LAB — COMPREHENSIVE METABOLIC PANEL
ALBUMIN: 3.2 g/dL — AB (ref 3.5–5.0)
ALK PHOS: 124 U/L (ref 40–150)
ALT: 19 U/L (ref 0–55)
ANION GAP: 12 meq/L — AB (ref 3–11)
AST: 16 U/L (ref 5–34)
BILIRUBIN TOTAL: 0.53 mg/dL (ref 0.20–1.20)
BUN: 50.4 mg/dL — ABNORMAL HIGH (ref 7.0–26.0)
CALCIUM: 9.6 mg/dL (ref 8.4–10.4)
CO2: 23 meq/L (ref 22–29)
Chloride: 110 mEq/L — ABNORMAL HIGH (ref 98–109)
Creatinine: 4.9 mg/dL (ref 0.6–1.1)
EGFR: 8 mL/min/{1.73_m2} — AB (ref >90)
Glucose: 97 mg/dl (ref 70–140)
Potassium: 4.7 mEq/L (ref 3.5–5.1)
SODIUM: 145 meq/L (ref 136–145)
Total Protein: 6.2 g/dL — ABNORMAL LOW (ref 6.4–8.3)

## 2015-07-26 LAB — CBC WITH DIFFERENTIAL/PLATELET
BASO%: 0.5 % (ref 0.0–2.0)
Basophils Absolute: 0 10*3/uL (ref 0.0–0.1)
EOS%: 4.1 % (ref 0.0–7.0)
Eosinophils Absolute: 0.2 10*3/uL (ref 0.0–0.5)
HEMATOCRIT: 30.6 % — AB (ref 34.8–46.6)
HEMOGLOBIN: 9.6 g/dL — AB (ref 11.6–15.9)
LYMPH#: 0.9 10*3/uL (ref 0.9–3.3)
LYMPH%: 21.2 % (ref 14.0–49.7)
MCH: 32.2 pg (ref 25.1–34.0)
MCHC: 31.4 g/dL — AB (ref 31.5–36.0)
MCV: 102.7 fL — ABNORMAL HIGH (ref 79.5–101.0)
MONO#: 1.1 10*3/uL — ABNORMAL HIGH (ref 0.1–0.9)
MONO%: 25 % — AB (ref 0.0–14.0)
NEUT#: 2.2 10*3/uL (ref 1.5–6.5)
NEUT%: 49.2 % (ref 38.4–76.8)
Platelets: 117 10*3/uL — ABNORMAL LOW (ref 145–400)
RBC: 2.98 10*6/uL — ABNORMAL LOW (ref 3.70–5.45)
RDW: 17.4 % — AB (ref 11.2–14.5)
WBC: 4.4 10*3/uL (ref 3.9–10.3)

## 2015-07-26 MED ORDER — HEPARIN SOD (PORK) LOCK FLUSH 100 UNIT/ML IV SOLN
500.0000 [IU] | Freq: Once | INTRAVENOUS | Status: AC | PRN
  Administered 2015-07-26: 500 [IU]
  Filled 2015-07-26: qty 5

## 2015-07-26 MED ORDER — SODIUM CHLORIDE 0.9 % IV SOLN: Freq: Once | INTRAVENOUS | Status: DC

## 2015-07-26 MED ORDER — SODIUM CHLORIDE 0.9 % IV SOLN
300.0000 mg/m2 | Freq: Once | INTRAVENOUS | Status: AC
  Administered 2015-07-26: 560 mg via INTRAVENOUS
  Filled 2015-07-26: qty 28

## 2015-07-26 MED ORDER — SODIUM CHLORIDE 0.9 % IV SOLN
Freq: Once | INTRAVENOUS | Status: AC
  Administered 2015-07-26: 10:00:00 via INTRAVENOUS
  Filled 2015-07-26: qty 4

## 2015-07-26 MED ORDER — SODIUM CHLORIDE 0.9 % IJ SOLN
10.0000 mL | INTRAMUSCULAR | Status: DC | PRN
  Administered 2015-07-26: 10 mL
  Filled 2015-07-26: qty 10

## 2015-07-26 MED ORDER — SODIUM CHLORIDE 0.9 % IV SOLN
Freq: Once | INTRAVENOUS | Status: AC
  Administered 2015-07-26: 10:00:00 via INTRAVENOUS

## 2015-07-26 MED ORDER — DEXTROSE 5 % IV SOLN
37.0000 mg/m2 | Freq: Once | INTRAVENOUS | Status: AC
  Administered 2015-07-26: 70 mg via INTRAVENOUS
  Filled 2015-07-26: qty 30

## 2015-07-26 NOTE — Progress Notes (Signed)
Ok to treat with Creatinine of 4.9 per Dr. Jana Hakim

## 2015-07-26 NOTE — Patient Instructions (Signed)
[facility] Discharge Instructions for Patients Receiving Chemotherapy  Today you received the following chemotherapy agents: Kyprolis and Cytoxan.  To help prevent nausea and vomiting after your treatment, we encourage you to take your nausea medication:Zofran 8 mg every 8 hours as needed.   If you develop nausea and vomiting that is not controlled by your nausea medication, call the clinic.   BELOW ARE SYMPTOMS THAT SHOULD BE REPORTED IMMEDIATELY:  *FEVER GREATER THAN 100.5 F  *CHILLS WITH OR WITHOUT FEVER  NAUSEA AND VOMITING THAT IS NOT CONTROLLED WITH YOUR NAUSEA MEDICATION  *UNUSUAL SHORTNESS OF BREATH  *UNUSUAL BRUISING OR BLEEDING  TENDERNESS IN MOUTH AND THROAT WITH OR WITHOUT PRESENCE OF ULCERS  *URINARY PROBLEMS  *BOWEL PROBLEMS  UNUSUAL RASH Items with * indicate a potential emergency and should be followed up as soon as possible.  Feel free to call the clinic you have any questions or concerns. The clinic phone number is [phone].  Please show the [place] at check-in to the Emergency Department and triage nurse.

## 2015-07-27 ENCOUNTER — Encounter: Payer: Self-pay | Admitting: Internal Medicine

## 2015-07-27 ENCOUNTER — Ambulatory Visit (INDEPENDENT_AMBULATORY_CARE_PROVIDER_SITE_OTHER): Payer: Medicare Other | Admitting: Internal Medicine

## 2015-07-27 ENCOUNTER — Ambulatory Visit (HOSPITAL_BASED_OUTPATIENT_CLINIC_OR_DEPARTMENT_OTHER): Payer: Medicare Other

## 2015-07-27 VITALS — BP 102/54 | HR 64 | Temp 98.0°F | Resp 16 | Ht 65.0 in | Wt 162.0 lb

## 2015-07-27 VITALS — BP 114/61 | HR 69 | Temp 97.8°F

## 2015-07-27 DIAGNOSIS — H109 Unspecified conjunctivitis: Secondary | ICD-10-CM

## 2015-07-27 DIAGNOSIS — Z5112 Encounter for antineoplastic immunotherapy: Secondary | ICD-10-CM

## 2015-07-27 DIAGNOSIS — C9000 Multiple myeloma not having achieved remission: Secondary | ICD-10-CM | POA: Diagnosis not present

## 2015-07-27 DIAGNOSIS — T889XXA Complication of surgical and medical care, unspecified, initial encounter: Secondary | ICD-10-CM

## 2015-07-27 MED ORDER — ERYTHROMYCIN 5 MG/GM OP OINT: TOPICAL_OINTMENT | Freq: Three times a day (TID) | OPHTHALMIC | Status: AC

## 2015-07-27 MED ORDER — SODIUM CHLORIDE 0.9 % IV SOLN
Freq: Once | INTRAVENOUS | Status: AC
  Administered 2015-07-27: 10:00:00 via INTRAVENOUS

## 2015-07-27 MED ORDER — ACETAMINOPHEN 325 MG PO TABS
650.0000 mg | ORAL_TABLET | Freq: Once | ORAL | Status: AC
  Administered 2015-07-27: 650 mg via ORAL

## 2015-07-27 MED ORDER — DEXTROSE 5 % IV SOLN
37.0000 mg/m2 | Freq: Once | INTRAVENOUS | Status: AC
  Administered 2015-07-27: 70 mg via INTRAVENOUS
  Filled 2015-07-27: qty 5

## 2015-07-27 MED ORDER — SODIUM CHLORIDE 0.9 % IV SOLN
Freq: Once | INTRAVENOUS | Status: AC
  Administered 2015-07-27: 10:00:00 via INTRAVENOUS
  Filled 2015-07-27: qty 4

## 2015-07-27 MED ORDER — ACETAMINOPHEN 325 MG PO TABS
ORAL_TABLET | ORAL | Status: AC
  Filled 2015-07-27: qty 2

## 2015-07-27 MED ORDER — SODIUM CHLORIDE 0.9 % IJ SOLN
10.0000 mL | INTRAMUSCULAR | Status: DC | PRN
  Administered 2015-07-27: 10 mL
  Filled 2015-07-27: qty 10

## 2015-07-27 MED ORDER — HEPARIN SOD (PORK) LOCK FLUSH 100 UNIT/ML IV SOLN
500.0000 [IU] | Freq: Once | INTRAVENOUS | Status: AC | PRN
  Administered 2015-07-27: 500 [IU]
  Filled 2015-07-27: qty 5

## 2015-07-27 MED ORDER — ERYTHROMYCIN 5 MG/GM OP OINT: TOPICAL_OINTMENT | Freq: Three times a day (TID) | OPHTHALMIC | Status: DC

## 2015-07-27 NOTE — Progress Notes (Signed)
   Subjective:    Patient ID: Nancy Norris, female    DOB: 1940/05/30, 75 y.o.   MRN: [id number]  HPI  She reports that she has been having some itching and redness in the left eye.  She reports that she thinks that she left a contact in the left eye.  She reports that she did try to take her contact out but her eye is painful.  She reports that she is getting a lot of crusting to the eye lids.  She is getting chemotherapy currently.  She thinks that she took the contact out Sunday but isn't sure.  She normally removes them every day.  She has not been wearing glasses.  She is normally followed by Dr. Earl Gala.    Review of Systems  Constitutional: Negative for fever, chills and fatigue.  Eyes: Positive for discharge, redness and visual disturbance. Negative for photophobia, pain and itching.  Respiratory: Negative for chest tightness and shortness of breath.   Cardiovascular: Negative for chest pain and palpitations.  Gastrointestinal: Negative for nausea and vomiting.  Neurological: Negative for headaches.       Objective:   Physical Exam  Constitutional: She is oriented to person, place, and time. She appears well-developed and well-nourished. No distress.  HENT:  Head: Normocephalic.  Mouth/Throat: Oropharynx is clear and moist. No oropharyngeal exudate.  Eyes: EOM are normal. Pupils are equal, round, and reactive to light. Right eye exhibits no chemosis, no discharge, no exudate and no hordeolum. No foreign body present in the right eye. Left eye exhibits discharge. Left eye exhibits no chemosis, no exudate and no hordeolum. Foreign body present in the left eye. Right conjunctiva is not injected. Right conjunctiva has no hemorrhage. Left conjunctiva is injected. Left conjunctiva has no hemorrhage. No scleral icterus.  Two contacts in place in the left eye.    Neck: Normal range of motion. Neck supple.  Cardiovascular: Normal rate, regular rhythm, normal heart sounds and intact distal  pulses.   Pulmonary/Chest: Effort normal.  Musculoskeletal: Normal range of motion.  Neurological: She is alert and oriented to person, place, and time.  Skin: Skin is warm and dry. She is not diaphoretic.  Psychiatric: She has a normal mood and affect. Her behavior is normal. Judgment and thought content normal.  Nursing note and vitals reviewed.   Filed Vitals:   07/27/15 1602  BP: 102/54  Pulse: 64  Temp: 98 F (36.7 C)  Resp: 16           Assessment & Plan:    1. Contact lens stuck, initial encounter -2 contact lenses removed from left eye after thorough irrigation with sterile saline  2. Conjunctivitis of left eye -romycin -lubricating eye drops -avoid contact lenses x 1 week

## 2015-07-27 NOTE — Patient Instructions (Signed)
[facility] Discharge Instructions for Patients Receiving Chemotherapy  Today you received the following chemotherapy agents Kyprolis.  To help prevent nausea and vomiting after your treatment, we encourage you to take your nausea medication as prescribed.   If you develop nausea and vomiting that is not controlled by your nausea medication, call the clinic.   BELOW ARE SYMPTOMS THAT SHOULD BE REPORTED IMMEDIATELY:  *FEVER GREATER THAN 100.5 F  *CHILLS WITH OR WITHOUT FEVER  NAUSEA AND VOMITING THAT IS NOT CONTROLLED WITH YOUR NAUSEA MEDICATION  *UNUSUAL SHORTNESS OF BREATH  *UNUSUAL BRUISING OR BLEEDING  TENDERNESS IN MOUTH AND THROAT WITH OR WITHOUT PRESENCE OF ULCERS  *URINARY PROBLEMS  *BOWEL PROBLEMS  UNUSUAL RASH Items with * indicate a potential emergency and should be followed up as soon as possible.  Feel free to call the clinic you have any questions or concerns. The clinic phone number is [phone].  Please show the CHEMO ALERT CARD at check-in to the Emergency Department and triage nurse.   

## 2015-07-28 DIAGNOSIS — M25511 Pain in right shoulder: Secondary | ICD-10-CM | POA: Diagnosis not present

## 2015-08-08 ENCOUNTER — Encounter: Payer: Self-pay | Admitting: Internal Medicine

## 2015-08-08 ENCOUNTER — Other Ambulatory Visit: Payer: Self-pay | Admitting: *Deleted

## 2015-08-08 DIAGNOSIS — C9000 Multiple myeloma not having achieved remission: Secondary | ICD-10-CM

## 2015-08-09 ENCOUNTER — Telehealth: Payer: Self-pay | Admitting: *Deleted

## 2015-08-09 ENCOUNTER — Ambulatory Visit (HOSPITAL_BASED_OUTPATIENT_CLINIC_OR_DEPARTMENT_OTHER): Payer: Medicare Other | Admitting: Internal Medicine

## 2015-08-09 ENCOUNTER — Other Ambulatory Visit (HOSPITAL_BASED_OUTPATIENT_CLINIC_OR_DEPARTMENT_OTHER): Payer: Medicare Other

## 2015-08-09 ENCOUNTER — Telehealth: Payer: Self-pay | Admitting: Internal Medicine

## 2015-08-09 ENCOUNTER — Encounter: Payer: Self-pay | Admitting: Internal Medicine

## 2015-08-09 VITALS — BP 117/54 | HR 66 | Temp 97.9°F | Resp 18 | Ht 65.0 in | Wt 155.5 lb

## 2015-08-09 DIAGNOSIS — N185 Chronic kidney disease, stage 5: Secondary | ICD-10-CM | POA: Diagnosis not present

## 2015-08-09 DIAGNOSIS — Z853 Personal history of malignant neoplasm of breast: Secondary | ICD-10-CM

## 2015-08-09 DIAGNOSIS — C9000 Multiple myeloma not having achieved remission: Secondary | ICD-10-CM

## 2015-08-09 DIAGNOSIS — R530 Neoplastic (malignant) related fatigue: Secondary | ICD-10-CM | POA: Diagnosis not present

## 2015-08-09 DIAGNOSIS — Z5111 Encounter for antineoplastic chemotherapy: Secondary | ICD-10-CM

## 2015-08-09 LAB — CBC WITH DIFFERENTIAL/PLATELET
BASO%: 0.6 % (ref 0.0–2.0)
Basophils Absolute: 0 10*3/uL (ref 0.0–0.1)
EOS ABS: 0.1 10*3/uL (ref 0.0–0.5)
EOS%: 2.2 % (ref 0.0–7.0)
HEMATOCRIT: 29.8 % — AB (ref 34.8–46.6)
HGB: 9.6 g/dL — ABNORMAL LOW (ref 11.6–15.9)
LYMPH#: 0.4 10*3/uL — AB (ref 0.9–3.3)
LYMPH%: 10.2 % — AB (ref 14.0–49.7)
MCH: 32.6 pg (ref 25.1–34.0)
MCHC: 32.2 g/dL (ref 31.5–36.0)
MCV: 101.2 fL — AB (ref 79.5–101.0)
MONO#: 0.8 10*3/uL (ref 0.1–0.9)
MONO%: 19.1 % — ABNORMAL HIGH (ref 0.0–14.0)
NEUT%: 67.9 % (ref 38.4–76.8)
NEUTROS ABS: 2.7 10*3/uL (ref 1.5–6.5)
PLATELETS: 83 10*3/uL — AB (ref 145–400)
RBC: 2.94 10*6/uL — AB (ref 3.70–5.45)
RDW: 16.8 % — ABNORMAL HIGH (ref 11.2–14.5)
WBC: 4 10*3/uL (ref 3.9–10.3)

## 2015-08-09 LAB — COMPREHENSIVE METABOLIC PANEL
ALBUMIN: 3.6 g/dL (ref 3.5–5.0)
ALK PHOS: 123 U/L (ref 40–150)
ALT: 13 U/L (ref 0–55)
ANION GAP: 10 meq/L (ref 3–11)
AST: 13 U/L (ref 5–34)
BILIRUBIN TOTAL: 0.81 mg/dL (ref 0.20–1.20)
BUN: 47.2 mg/dL — ABNORMAL HIGH (ref 7.0–26.0)
CALCIUM: 9 mg/dL (ref 8.4–10.4)
CO2: 24 mEq/L (ref 22–29)
CREATININE: 3.8 mg/dL — AB (ref 0.6–1.1)
Chloride: 107 mEq/L (ref 98–109)
EGFR: 11 mL/min/{1.73_m2} — AB (ref >90)
Glucose: 77 mg/dl (ref 70–140)
Potassium: 4 mEq/L (ref 3.5–5.1)
Sodium: 141 mEq/L (ref 136–145)
TOTAL PROTEIN: 6 g/dL — AB (ref 6.4–8.3)

## 2015-08-09 MED ORDER — AZITHROMYCIN 250 MG PO TABS: ORAL_TABLET | ORAL | 0 refills | Status: DC

## 2015-08-09 NOTE — Progress Notes (Signed)
[name] [phone]   [phone]  OFFICE PROGRESS NOTE  [name], MD [address] [place] [address]  PRINCIPAL DIAGNOSES:  1. Recurrent multiple myeloma initially diagnosed in 2002, initially with smoldering myeloma at [facility].  2. Ductal carcinoma in situ status post mastectomy with sentinel lymph node biopsy in October 2008.  PRIOR THERAPY:  1. Status post treatment with tamoxifen from November 2008 through February 2009, discontinued secondary to intolerance. 2. Status post 3 cycles of chemotherapy with Revlimid and Decadron followed by 1 cycle of Decadron only with mild response. 3. Status post 2 cycles of systemic chemotherapy with Velcade, Doxil and Decadron discontinued secondary to significant peripheral neuropathy. Last dose was given May 2010 at [facility]. 4. Status post autologous peripheral blood stem cell transplant on October 01, 2008 at [facility] under the care of Dr. Phyllis Ginger.  5. Systemic chemotherapy with Carfilzomib initially was 20 mg/M2 and will be increased after cycle #1 to 36 mg/M2 on days 1, 2, 8, 9, 15 and 16 every 4 weeks in addition to Cytoxan 300 mg/M2 and Decadron 40 mg by mouth weekly basis, status post 4 cycles. First cycle on 12/29/2012. 6. She resumed chemotherapy with Carfilzomib, Cytoxan, and Decadron on 05/03/2014. Status post 6 cycles.  CURRENT THERAPY: The patient resumed her systemic chemotherapy again with Carfilzomib, Cytoxan and Decadron on 07/11/2015. Status post one cycle.  INTERVAL HISTORY: Nancy Norris 75 y.o. female returns to the clinic today for follow-up visit. She is feeling a little bit tired and fatigued today. She has early flulike symptoms. She was on bus tour with her church to [facility] and she was sitting under the air condition in the bus and got sick. She denied having any fever or chills today. She has no  chest pain, shortness breath, cough or hemoptysis. She denied having any nausea or vomiting. No significant weight loss or night sweats. She is here today for evaluation before starting cycle #2.  MEDICAL HISTORY: Past Medical History:  Diagnosis Date  . Anemia   . Anginal pain ([place])    used NTG x 2 May 31 and 06/15/13   . Anxiety   . Arthritis   . B12 deficiency 12/04/2014  . Breast cancer ([place])   . CKD (chronic kidney disease) stage 3, GFR 30-59 ml/min   . Complication of anesthesia   . COPD (chronic obstructive pulmonary disease) ([place])   . Depression   . Dizziness   . Fibromyalgia   . Fibromyalgia   . GERD (gastroesophageal reflux disease)   . Headache(784.0)   . History of blood transfusion    last one May 12   . Hx of cardiovascular stress test    LexiScan with low level exercise Myoview (02/2013): No ischemia, EF 72%; normal study  . Hx of echocardiogram    a.  Echocardiogram (12/26/2012): EF 19-37%, grade 1 diastolic dysfunction;   b.  Echocardiogram (02/2013): EF 55-60%, no WMA, trivial effusion  . Hyperkalemia   . Hyponatremia   . Hypothyroidism   . Mucositis   . Multiple myeloma   . Myocardial infarction [facility])    in past, patient was unaware.   . Neuropathy ([place])   . Pain in joint, pelvic region and thigh 07/07/2015  . PONV (postoperative nausea and vomiting) 2008   after mastestomy    ALLERGIES:  is allergic to codeine; latex; other; onion; zyprexa [olanzapine]; adhesive [tape]; hydrocodone; iodinated  diagnostic agents; and sulfa antibiotics.  MEDICATIONS:  Current Outpatient Prescriptions  Medication Sig Dispense Refill  . acetaminophen (TYLENOL) 325 MG tablet Take 650 mg by mouth every 6 (six) hours as needed for mild pain or moderate pain.     Jearl Klinefelter ELLIPTA 62.5-25 MCG/INH AEPB   0  . ascorbic acid (VITAMIN C) 250 MG CHEW Chew 250 mg by mouth daily.    Marland Kitchen azelastine (ASTELIN) 0.1 % nasal spray Place 2 sprays into both nostrils 2 (two) times daily. Use in each  nostril as directed 30 mL 2  . buPROPion (WELLBUTRIN XL) 300 MG 24 hr tablet Take 1 tablet (300 mg total) by mouth daily. 90 tablet 1  . carboxymethylcellulose (REFRESH PLUS) 0.5 % SOLN Place 1 drop into both eyes every 2 (two) hours.    . cetirizine (ZYRTEC) 10 MG tablet Take 10 mg by mouth.    . Cholecalciferol 4000 UNITS TABS Take 4,000 Units by mouth daily.    . ciprofloxacin (CIPRO) 250 MG tablet Take 1 tablet (250 mg total) by mouth 2 (two) times daily. 10 tablet 0  . citalopram (CELEXA) 40 MG tablet Take 1 tablet daily or as directed 90 tablet 1  . cromolyn (OPTICROM) 4 % ophthalmic solution Place 1 drop into both eyes 4 (four) times daily.    . ferrous sulfate 325 (65 FE) MG tablet Take 1 tablet daily or as directed 90 tablet 1  . gabapentin (NEURONTIN) 300 MG capsule TAKE 1 CAPSULE BY MOUTH AT BEDTIME. 90 capsule 1  . isosorbide mononitrate (IMDUR) 30 MG 24 hr tablet Take 1 tablet (30 mg total) by mouth daily. 90 tablet 3  . levothyroxine (SYNTHROID, LEVOTHROID) 150 MCG tablet Take 1 tablet (150 mcg total) by mouth daily. 90 tablet 1  . lidocaine (LIDODERM) 5 % Place 1 patch onto the skin daily. Remove & Discard patch within 12 hours or as directed by MD 30 patch 0  . loperamide (IMODIUM) 2 MG capsule Take two tabs po initially, then one tab after each loose stool: max 8 tabs in 24 hours 30 capsule 0  . loratadine (CLARITIN) 10 MG tablet Take 10 mg by mouth daily.    Marland Kitchen LORazepam (ATIVAN) 0.5 MG tablet Take 1 tablet (0.5 mg total) by mouth every 8 (eight) hours as needed (nausea). 30 tablet 0  . loteprednol (ALREX) 0.2 % SUSP Place 1 drop into both eyes 4 (four) times daily. 1 Bottle PRN  . meloxicam (MOBIC) 15 MG tablet Take 1 tablet (15 mg total) by mouth daily. 90 tablet 3  . midodrine (PROAMATINE) 10 MG tablet TAKE 1 TABLET BY MOUTH 3 TIMES DAILY. 270 tablet 3  . nitroGLYCERIN (NITROSTAT) 0.4 MG SL tablet Place 0.4 mg under the tongue every 5 (five) minutes as needed for chest pain.  Reported on 03/30/2015    . omeprazole (PRILOSEC) 40 MG capsule Take 40 mg by mouth.    . ondansetron (ZOFRAN) 4 MG tablet Take 4 mg by mouth.    . ondansetron (ZOFRAN-ODT) 8 MG disintegrating tablet Take 1 tablet (8 mg total) by mouth every 8 (eight) hours as needed for nausea or vomiting. 90 tablet 0  . pantoprazole (PROTONIX) 40 MG tablet Take 1 tablet daily for Acid reflux & indigestion 90 tablet 1  . pilocarpine (SALAGEN) 5 MG tablet Take 1 tablet 3 x / day for dry mouth. 270 tablet 1  . promethazine (PHENERGAN) 25 MG tablet Take 25 mg by mouth.    . ranitidine (  ZANTAC) 300 MG tablet Take 1 to 2 tablets daily for heartburn & reflux to allow wean and transition from PPI / pantoprazole 180 tablet 3  . senna (SENOKOT) 8.6 MG tablet Take 1 tablet by mouth at bedtime.    . traMADol (ULTRAM) 50 MG tablet Take 1 to 2 tablets by mouth 4 times a day as needed for pain. 180 tablet 0  . albuterol (PROVENTIL HFA;VENTOLIN HFA) 108 (90 Base) MCG/ACT inhaler Inhale 1-2 puffs into the lungs every 6 (six) hours as needed for wheezing or shortness of breath (cough). 1 Inhaler 0  . azithromycin (ZITHROMAX) 250 MG tablet Use as instructed 6 each 0   No current facility-administered medications for this visit.    Facility-Administered Medications Ordered in Other Visits  Medication Dose Route Frequency Provider Last Rate Last Dose  . 0.9 %  sodium chloride infusion   Intravenous Once Adrena E Johnson, PA-C      . acetaminophen (TYLENOL) tablet 650 mg  650 mg Oral Once Susanne Borders, NP      . sodium chloride 0.9 % injection 10 mL  10 mL Intracatheter PRN Curt Bears, MD   10 mL at 01/05/13 1825  . sodium chloride 0.9 % injection 10 mL  10 mL Intracatheter PRN Curt Bears, MD   10 mL at 06/15/14 1514    SURGICAL HISTORY:  Past Surgical History:  Procedure Laterality Date  . ABDOMINAL HYSTERECTOMY  1981  . AV FISTULA PLACEMENT Left 06/19/2013   Procedure: CREATION OF LEFT ARM ARTERIOVENOUS (AV)  FISTULA ;  Surgeon: Angelia Mould, MD;  Location: [facility];  Service: Vascular;  Laterality: Left;  . BREAST RECONSTRUCTION    . BREAST SURGERY    . CATARACT EXTRACTION, BILATERAL    . CHOLECYSTECTOMY  1971  . EYE SURGERY Bilateral    lens implant  . history of Port removal    . MASTECTOMY Left 2008  . PORTACATH PLACEMENT  12/2012   has had 2  . Status post stem cell transplant on September 28, 2008.      REVIEW OF SYSTEMS:  Constitutional: positive for fatigue Eyes: negative Ears, nose, mouth, throat, and face: negative Respiratory: negative Cardiovascular: negative Gastrointestinal: negative Genitourinary:negative Integument/breast: negative Hematologic/lymphatic: negative Musculoskeletal:negative Neurological: negative Behavioral/Psych: negative Endocrine: negative Allergic/Immunologic: negative   PHYSICAL EXAMINATION: General appearance: alert, cooperative and no distress Head: Normocephalic, without obvious abnormality, atraumatic Neck: no adenopathy, no JVD, supple, symmetrical, trachea midline and thyroid not enlarged, symmetric, no tenderness/mass/nodules Lymph nodes: Cervical, supraclavicular, and axillary nodes normal. Resp: clear to auscultation bilaterally Back: symmetric, no curvature. ROM normal. No CVA tenderness. Cardio: regular rate and rhythm, S1, S2 normal, no murmur, click, rub or gallop GI: soft, non-tender; bowel sounds normal; no masses,  no organomegaly Extremities: extremities normal, atraumatic, no cyanosis or edema Neurologic: Alert and oriented X 3, normal strength and tone. Normal symmetric reflexes. Normal coordination and gait  ECOG PERFORMANCE STATUS: 1 - Symptomatic but completely ambulatory  Blood pressure (!) 117/54, pulse 66, temperature 97.9 F (36.6 C), temperature source Oral, resp. rate 18, height 5' 5" (1.651 m), weight 155 lb 8 oz (70.5 kg), SpO2 98 %.  LABORATORY DATA: Lab Results  Component Value Date   WBC 4.0  08/09/2015   HGB 9.6 (L) 08/09/2015   HCT 29.8 (L) 08/09/2015   MCV 101.2 (H) 08/09/2015   PLT 83 (L) 08/09/2015      Chemistry      Component Value Date/Time   NA 141 08/09/2015  0853   K 4.0 08/09/2015 0853   CL 113 (H) 05/27/2015 1139   CL 105 03/19/2012 0811   CO2 24 08/09/2015 0853   BUN 47.2 (H) 08/09/2015 0853   CREATININE 3.8 (HH) 08/09/2015 0853      Component Value Date/Time   CALCIUM 9.0 08/09/2015 0853   ALKPHOS 123 08/09/2015 0853   AST 13 08/09/2015 0853   ALT 13 08/09/2015 0853   BILITOT 0.81 08/09/2015 0853     RADIOGRAPHIC STUDIES: Ct Abdomen Pelvis Wo Contrast  Result Date: 07/10/2015 CLINICAL DATA:  Right groin pain, status post hysterectomy, status postcholecystectomy, history of multiple myeloma, stage 4 chronic kidney disease EXAM: CT ABDOMEN AND PELVIS WITHOUT CONTRAST TECHNIQUE: Multidetector CT imaging of the abdomen and pelvis was performed following the standard protocol without IV contrast. COMPARISON:  CT 11/23/2013 FINDINGS: Lower chest: 3 mm nodule in right middle lobe anteriorly is stable in size in appearance from prior exam. Left breast implant is noted. Hepatobiliary: Unenhanced liver shows stable mild intrahepatic biliary ductal dilatation. The patient is status post cholecystectomy. Pancreas: Unenhanced pancreas is unremarkable. Spleen: Unenhanced spleen is unremarkable. Adrenals/Urinary Tract: No right adrenal mass. Stable low-density nodule probable adenoma left adrenal gland measures 1.3 cm. Again noted bilateral renal cortical thinning probable due to atrophy. There is mild dilatation of left extrarenal collecting system. No hydronephrosis or hydroureter. No calcified ureteral calculi are noted bilaterally. No calcified calculi are noted within urinary bladder. Stomach/Bowel: Small hiatal hernia is noted. There is no gastric outlet obstruction. Mild distended small bowel loops are noted in distal small bowel mid pelvis just above the urinary  bladder with some fecal like material. The terminal ileum is somewhat smaller caliber without clear transition point identified. Findings may be due to mild distal small bowel ileus or early small bowel obstruction. Clinical correlation is necessary. There is no pericecal inflammation. The appendix is not identified. Some colonic gas noted in transverse colon. Some colonic stool noted within descending colon and sigmoid colon. There is no evidence of distal colitis or diverticulitis. No distal colonic obstruction. Vascular/Lymphatic: Mild atherosclerotic calcifications of abdominal aorta and iliac arteries. No aortic aneurysm. No retroperitoneal or mesenteric adenopathy. Reproductive: The patient is status post hysterectomy. No pelvic ascites. Other: No ascites or free air. No inguinal adenopathy. No inguinal hernia. Musculoskeletal: No destructive bony lesions are noted. Sagittal images of the spine shows mild disc space flattening with mild posterior disc bulge at L3-L4 and L4-L5 level. There is significant disc space flattening with endplate sclerotic changes vacuum disc phenomenon mild anterior spurring at L5-S1 level. Facet degenerative changes noted L5 level. Mild dextroscoliosis lumbar spine. IMPRESSION: 1. There is mild segmental dilatation of distal small bowel in mid pelvis just above the urinary bladder with some fecal like material. Early is small bowel obstruction or ileus cannot be excluded. There is no transition point in caliber of small bowel. 2. No pericecal inflammation.  The appendix is not identified. 3. No hydronephrosis or hydroureter. Bilateral renal cortical thinning probable due to atrophy. 4. Stable left adrenal adenoma. 5. No evidence of colitis or diverticulitis. No evidence of distal colonic obstruction. 6. Degenerative changes lumbar spine as described above. Electronically Signed   By: Lahoma Crocker M.D.   On: 07/10/2015 16:17   Dg Hip Unilat With Pelvis 2-3 Views Right  Result Date:  07/12/2015 CLINICAL DATA:  Pain in the right groin developing 3 days ago and extending into the distal right femur. EXAM: DG HIP (WITH OR WITHOUT PELVIS) 2-3V  RIGHT COMPARISON:  None. FINDINGS: No evidence of hip fracture or subluxation. History of multiple myeloma. No myeloma focus seen in the proximal femora or pelvic ring. No notable hip degeneration. There is lower lumbar facet arthropathy and L5-S1 disc degeneration. IMPRESSION: No explanation for acute pain.  No visible myelomatous focus. Electronically Signed   By: Monte Fantasia M.D.   On: 07/12/2015 14:02   Dg Femur, Min 2 Views Right  Result Date: 07/12/2015 CLINICAL DATA:  Pain in the right groin developing 3 days ago and extending into the distal right femur. EXAM: RIGHT FEMUR 2 VIEWS COMPARISON:  None. FINDINGS: There is no evidence of fracture or other focal bone lesions. Soft tissues are unremarkable. IMPRESSION: Negative. Electronically Signed   By: Monte Fantasia M.D.   On: 07/12/2015 14:03    ASSESSMENT AND PLAN: This is a very pleasant 75 years old white female with history of multiple myeloma as well as chronic kidney disease status post several chemotherapy regimen and most recently treated with Carfilzomib, Cytoxan and Decadron status post 5 cycles. The patient has been tolerating her treatment well except for the fatigue secondary to end-stage renal disease as well as anemia of chronic disease. The patient is currently on observation and feeling fine. Unfortunately the recent myeloma panel showed evidence for disease progression with increase in the free kappa light chain and worsening of her renal function as well as anemia. I discussed the lab result with the patient and her daughter. I recommended for her to resume her systemic chemotherapy with Carfilzomib, Cytoxan and Decadron. She started the first dose of the treatment on 07/11/2015. She tolerated the first cycle well with no specific complaints. The patient is not feeling  very well today and her platelets count also low. I recommended for her to delay the start of cycle #2 by 1 week to recover from her cold symptoms and also for improvement of her platelets count. For the cold symptoms and questionable bronchitis, I started the patient on Z-Pak today. For the chronic kidney disease, the patient will continue her routine follow-up visit with her nephrologist. The patient would come back for follow-up visit in 5 weeks for evaluation and management of any adverse effect of her treatment before starting cycle #3. The patient was advised to call immediately if she has any concerning symptoms in the interval. The patient voices understanding of current disease status and treatment options and is in agreement with the current care plan.  All questions were answered. The patient knows to call the clinic with any problems, questions or concerns. We can certainly see the patient much sooner if necessary.  Disclaimer: This note was dictated with voice recognition software. Similar sounding words can inadvertently be transcribed and may not be corrected upon review.

## 2015-08-09 NOTE — Telephone Encounter (Signed)
Gave pt cal & avs °

## 2015-08-09 NOTE — Telephone Encounter (Signed)
Per staff message and POF I have scheduled appts. Advised scheduler of appts. JMW  

## 2015-08-11 DIAGNOSIS — M25561 Pain in right knee: Secondary | ICD-10-CM | POA: Diagnosis not present

## 2015-08-13 DIAGNOSIS — M25561 Pain in right knee: Secondary | ICD-10-CM | POA: Diagnosis not present

## 2015-08-15 ENCOUNTER — Other Ambulatory Visit: Payer: Self-pay | Admitting: Medical Oncology

## 2015-08-15 DIAGNOSIS — M25561 Pain in right knee: Secondary | ICD-10-CM | POA: Diagnosis not present

## 2015-08-15 DIAGNOSIS — C3490 Malignant neoplasm of unspecified part of unspecified bronchus or lung: Secondary | ICD-10-CM

## 2015-08-16 ENCOUNTER — Ambulatory Visit (HOSPITAL_BASED_OUTPATIENT_CLINIC_OR_DEPARTMENT_OTHER): Payer: Medicare Other

## 2015-08-16 ENCOUNTER — Other Ambulatory Visit (HOSPITAL_BASED_OUTPATIENT_CLINIC_OR_DEPARTMENT_OTHER): Payer: Medicare Other

## 2015-08-16 VITALS — BP 108/54 | HR 74 | Temp 98.7°F | Resp 18

## 2015-08-16 DIAGNOSIS — Z5111 Encounter for antineoplastic chemotherapy: Secondary | ICD-10-CM

## 2015-08-16 DIAGNOSIS — C3490 Malignant neoplasm of unspecified part of unspecified bronchus or lung: Secondary | ICD-10-CM

## 2015-08-16 DIAGNOSIS — C9000 Multiple myeloma not having achieved remission: Secondary | ICD-10-CM | POA: Diagnosis not present

## 2015-08-16 DIAGNOSIS — Z5112 Encounter for antineoplastic immunotherapy: Secondary | ICD-10-CM | POA: Diagnosis not present

## 2015-08-16 LAB — CBC WITH DIFFERENTIAL/PLATELET
BASO%: 0.2 % (ref 0.0–2.0)
BASOS ABS: 0 10*3/uL (ref 0.0–0.1)
EOS%: 3.1 % (ref 0.0–7.0)
Eosinophils Absolute: 0.2 10*3/uL (ref 0.0–0.5)
HCT: 33.6 % — ABNORMAL LOW (ref 34.8–46.6)
HEMOGLOBIN: 10.7 g/dL — AB (ref 11.6–15.9)
LYMPH%: 20.5 % (ref 14.0–49.7)
MCH: 32.7 pg (ref 25.1–34.0)
MCHC: 31.8 g/dL (ref 31.5–36.0)
MCV: 102.8 fL — ABNORMAL HIGH (ref 79.5–101.0)
MONO#: 1 10*3/uL — ABNORMAL HIGH (ref 0.1–0.9)
MONO%: 17.5 % — AB (ref 0.0–14.0)
NEUT#: 3.2 10*3/uL (ref 1.5–6.5)
NEUT%: 58.7 % (ref 38.4–76.8)
Platelets: 92 10*3/uL — ABNORMAL LOW (ref 145–400)
RBC: 3.27 10*6/uL — ABNORMAL LOW (ref 3.70–5.45)
RDW: 16.8 % — AB (ref 11.2–14.5)
WBC: 5.4 10*3/uL (ref 3.9–10.3)
lymph#: 1.1 10*3/uL (ref 0.9–3.3)

## 2015-08-16 LAB — COMPREHENSIVE METABOLIC PANEL
ALBUMIN: 3.9 g/dL (ref 3.5–5.0)
ALT: 10 U/L (ref 0–55)
AST: 13 U/L (ref 5–34)
Alkaline Phosphatase: 97 U/L (ref 40–150)
Anion Gap: 10 mEq/L (ref 3–11)
BUN: 31.3 mg/dL — AB (ref 7.0–26.0)
CHLORIDE: 111 meq/L — AB (ref 98–109)
CO2: 22 mEq/L (ref 22–29)
Calcium: 9.4 mg/dL (ref 8.4–10.4)
Creatinine: 3.2 mg/dL (ref 0.6–1.1)
EGFR: 14 mL/min/{1.73_m2} — ABNORMAL LOW (ref >90)
GLUCOSE: 101 mg/dL (ref 70–140)
POTASSIUM: 3.9 meq/L (ref 3.5–5.1)
SODIUM: 143 meq/L (ref 136–145)
Total Bilirubin: 0.98 mg/dL (ref 0.20–1.20)
Total Protein: 7.1 g/dL (ref 6.4–8.3)

## 2015-08-16 MED ORDER — DEXTROSE 5 % IV SOLN
37.0000 mg/m2 | Freq: Once | INTRAVENOUS | Status: AC
  Administered 2015-08-16: 70 mg via INTRAVENOUS
  Filled 2015-08-16: qty 30

## 2015-08-16 MED ORDER — SODIUM CHLORIDE 0.9 % IV SOLN
Freq: Once | INTRAVENOUS | Status: AC
  Administered 2015-08-16: 09:00:00 via INTRAVENOUS

## 2015-08-16 MED ORDER — SODIUM CHLORIDE 0.9 % IV SOLN
Freq: Once | INTRAVENOUS | Status: AC
  Administered 2015-08-16: 09:00:00 via INTRAVENOUS
  Filled 2015-08-16: qty 4

## 2015-08-16 MED ORDER — SODIUM CHLORIDE 0.9 % IJ SOLN
10.0000 mL | INTRAMUSCULAR | Status: DC | PRN
  Administered 2015-08-16: 10 mL
  Filled 2015-08-16: qty 10

## 2015-08-16 MED ORDER — SODIUM CHLORIDE 0.9 % IV SOLN
300.0000 mg/m2 | Freq: Once | INTRAVENOUS | Status: AC
  Administered 2015-08-16: 560 mg via INTRAVENOUS
  Filled 2015-08-16: qty 28

## 2015-08-16 MED ORDER — DEXTROSE 5 % IV SOLN: 37.0000 mg/m2 | Freq: Once | INTRAVENOUS | Status: DC

## 2015-08-16 MED ORDER — HEPARIN SOD (PORK) LOCK FLUSH 100 UNIT/ML IV SOLN
500.0000 [IU] | Freq: Once | INTRAVENOUS | Status: AC | PRN
  Administered 2015-08-16: 500 [IU]
  Filled 2015-08-16: qty 5

## 2015-08-16 NOTE — Progress Notes (Signed)
Per Dr Julien Nordmann it is okay to treat pt today with chemotherapy and todays labs .

## 2015-08-16 NOTE — Patient Instructions (Signed)
[facility] Discharge Instructions for Patients Receiving Chemotherapy  Today you received the following chemotherapy agents: Kyprolis and Cytoxan.  To help prevent nausea and vomiting after your treatment, we encourage you to take your nausea medication:Zofran 8 mg every 8 hours as needed.   If you develop nausea and vomiting that is not controlled by your nausea medication, call the clinic.   BELOW ARE SYMPTOMS THAT SHOULD BE REPORTED IMMEDIATELY:  *FEVER GREATER THAN 100.5 F  *CHILLS WITH OR WITHOUT FEVER  NAUSEA AND VOMITING THAT IS NOT CONTROLLED WITH YOUR NAUSEA MEDICATION  *UNUSUAL SHORTNESS OF BREATH  *UNUSUAL BRUISING OR BLEEDING  TENDERNESS IN MOUTH AND THROAT WITH OR WITHOUT PRESENCE OF ULCERS  *URINARY PROBLEMS  *BOWEL PROBLEMS  UNUSUAL RASH Items with * indicate a potential emergency and should be followed up as soon as possible.  Feel free to call the clinic you have any questions or concerns. The clinic phone number is [phone].  Please show the [place] at check-in to the Emergency Department and triage nurse.

## 2015-08-17 ENCOUNTER — Ambulatory Visit (HOSPITAL_BASED_OUTPATIENT_CLINIC_OR_DEPARTMENT_OTHER): Payer: Medicare Other

## 2015-08-17 VITALS — BP 129/59 | HR 80 | Temp 98.4°F | Resp 17

## 2015-08-17 DIAGNOSIS — Z5112 Encounter for antineoplastic immunotherapy: Secondary | ICD-10-CM

## 2015-08-17 DIAGNOSIS — C9000 Multiple myeloma not having achieved remission: Secondary | ICD-10-CM | POA: Diagnosis not present

## 2015-08-17 MED ORDER — HEPARIN SOD (PORK) LOCK FLUSH 100 UNIT/ML IV SOLN
500.0000 [IU] | Freq: Once | INTRAVENOUS | Status: AC | PRN
  Administered 2015-08-17: 500 [IU]
  Filled 2015-08-17: qty 5

## 2015-08-17 MED ORDER — SODIUM CHLORIDE 0.9 % IV SOLN
Freq: Once | INTRAVENOUS | Status: AC
  Administered 2015-08-17: 09:00:00 via INTRAVENOUS

## 2015-08-17 MED ORDER — DEXTROSE 5 % IV SOLN
37.0000 mg/m2 | Freq: Once | INTRAVENOUS | Status: AC
  Administered 2015-08-17: 70 mg via INTRAVENOUS
  Filled 2015-08-17: qty 30

## 2015-08-17 MED ORDER — SODIUM CHLORIDE 0.9 % IV SOLN
Freq: Once | INTRAVENOUS | Status: AC
  Administered 2015-08-17: 09:00:00 via INTRAVENOUS
  Filled 2015-08-17: qty 4

## 2015-08-17 MED ORDER — SODIUM CHLORIDE 0.9 % IJ SOLN
10.0000 mL | INTRAMUSCULAR | Status: DC | PRN
  Administered 2015-08-17: 10 mL
  Filled 2015-08-17: qty 10

## 2015-08-17 NOTE — Patient Instructions (Signed)
[facility] Discharge Instructions for Patients Receiving Chemotherapy  Today you received the following chemotherapy agents Kyprolis.  To help prevent nausea and vomiting after your treatment, we encourage you to take your nausea medication as prescribed.   If you develop nausea and vomiting that is not controlled by your nausea medication, call the clinic.   BELOW ARE SYMPTOMS THAT SHOULD BE REPORTED IMMEDIATELY:  *FEVER GREATER THAN 100.5 F  *CHILLS WITH OR WITHOUT FEVER  NAUSEA AND VOMITING THAT IS NOT CONTROLLED WITH YOUR NAUSEA MEDICATION  *UNUSUAL SHORTNESS OF BREATH  *UNUSUAL BRUISING OR BLEEDING  TENDERNESS IN MOUTH AND THROAT WITH OR WITHOUT PRESENCE OF ULCERS  *URINARY PROBLEMS  *BOWEL PROBLEMS  UNUSUAL RASH Items with * indicate a potential emergency and should be followed up as soon as possible.  Feel free to call the clinic you have any questions or concerns. The clinic phone number is [phone].  Please show the CHEMO ALERT CARD at check-in to the Emergency Department and triage nurse.   

## 2015-08-22 ENCOUNTER — Other Ambulatory Visit: Payer: Self-pay | Admitting: Medical Oncology

## 2015-08-22 DIAGNOSIS — C9000 Multiple myeloma not having achieved remission: Secondary | ICD-10-CM

## 2015-08-23 ENCOUNTER — Other Ambulatory Visit: Payer: Self-pay | Admitting: Nurse Practitioner

## 2015-08-23 ENCOUNTER — Other Ambulatory Visit (HOSPITAL_BASED_OUTPATIENT_CLINIC_OR_DEPARTMENT_OTHER): Payer: Medicare Other

## 2015-08-23 ENCOUNTER — Encounter: Payer: Self-pay | Admitting: Nurse Practitioner

## 2015-08-23 ENCOUNTER — Ambulatory Visit (HOSPITAL_COMMUNITY)
Admission: RE | Admit: 2015-08-23 | Discharge: 2015-08-23 | Disposition: A | Discharge status: Home / Self Care | Payer: Medicare Other | Source: Ambulatory Visit | Attending: Nurse Practitioner | Admitting: Nurse Practitioner

## 2015-08-23 ENCOUNTER — Ambulatory Visit: Payer: Medicare Other

## 2015-08-23 ENCOUNTER — Ambulatory Visit (HOSPITAL_BASED_OUTPATIENT_CLINIC_OR_DEPARTMENT_OTHER): Payer: Medicare Other | Admitting: Nurse Practitioner

## 2015-08-23 VITALS — BP 116/55 | HR 72 | Temp 98.1°F | Resp 17 | Ht 65.0 in | Wt 151.2 lb

## 2015-08-23 DIAGNOSIS — N185 Chronic kidney disease, stage 5: Secondary | ICD-10-CM | POA: Diagnosis not present

## 2015-08-23 DIAGNOSIS — R05 Cough: Secondary | ICD-10-CM | POA: Diagnosis not present

## 2015-08-23 DIAGNOSIS — C9000 Multiple myeloma not having achieved remission: Secondary | ICD-10-CM

## 2015-08-23 DIAGNOSIS — J189 Pneumonia, unspecified organism: Secondary | ICD-10-CM | POA: Diagnosis not present

## 2015-08-23 DIAGNOSIS — R0989 Other specified symptoms and signs involving the circulatory and respiratory systems: Secondary | ICD-10-CM | POA: Insufficient documentation

## 2015-08-23 DIAGNOSIS — J181 Lobar pneumonia, unspecified organism: Secondary | ICD-10-CM

## 2015-08-23 LAB — CBC WITH DIFFERENTIAL/PLATELET
BASO%: 0.4 % (ref 0.0–2.0)
BASOS ABS: 0 10*3/uL (ref 0.0–0.1)
EOS ABS: 0.1 10*3/uL (ref 0.0–0.5)
EOS%: 4.1 % (ref 0.0–7.0)
HEMATOCRIT: 29.1 % — AB (ref 34.8–46.6)
HEMOGLOBIN: 9.5 g/dL — AB (ref 11.6–15.9)
LYMPH#: 0.5 10*3/uL — AB (ref 0.9–3.3)
LYMPH%: 13.7 % — ABNORMAL LOW (ref 14.0–49.7)
MCH: 33.3 pg (ref 25.1–34.0)
MCHC: 32.8 g/dL (ref 31.5–36.0)
MCV: 101.5 fL — ABNORMAL HIGH (ref 79.5–101.0)
MONO#: 0.6 10*3/uL (ref 0.1–0.9)
MONO%: 17.9 % — AB (ref 0.0–14.0)
NEUT%: 63.9 % (ref 38.4–76.8)
NEUTROS ABS: 2.1 10*3/uL (ref 1.5–6.5)
Platelets: 39 10*3/uL — ABNORMAL LOW (ref 145–400)
RBC: 2.87 10*6/uL — ABNORMAL LOW (ref 3.70–5.45)
RDW: 16.7 % — AB (ref 11.2–14.5)
WBC: 3.4 10*3/uL — AB (ref 3.9–10.3)

## 2015-08-23 LAB — COMPREHENSIVE METABOLIC PANEL
ALBUMIN: 3.5 g/dL (ref 3.5–5.0)
ALK PHOS: 125 U/L (ref 40–150)
ALT: 17 U/L (ref 0–55)
AST: 19 U/L (ref 5–34)
Anion Gap: 8 mEq/L (ref 3–11)
BILIRUBIN TOTAL: 0.74 mg/dL (ref 0.20–1.20)
BUN: 42.6 mg/dL — AB (ref 7.0–26.0)
CALCIUM: 10.4 mg/dL (ref 8.4–10.4)
CO2: 23 mEq/L (ref 22–29)
CREATININE: 3.2 mg/dL — AB (ref 0.6–1.1)
Chloride: 113 mEq/L — ABNORMAL HIGH (ref 98–109)
EGFR: 14 mL/min/{1.73_m2} — ABNORMAL LOW (ref >90)
GLUCOSE: 83 mg/dL (ref 70–140)
Potassium: 4.4 mEq/L (ref 3.5–5.1)
SODIUM: 144 meq/L (ref 136–145)
TOTAL PROTEIN: 6.1 g/dL — AB (ref 6.4–8.3)

## 2015-08-23 MED ORDER — ALBUTEROL SULFATE (2.5 MG/3ML) 0.083% IN NEBU
INHALATION_SOLUTION | RESPIRATORY_TRACT | Status: AC
  Filled 2015-08-23: qty 3

## 2015-08-23 MED ORDER — ALBUTEROL SULFATE (2.5 MG/3ML) 0.083% IN NEBU
2.5000 mg | INHALATION_SOLUTION | Freq: Once | RESPIRATORY_TRACT | Status: AC
  Administered 2015-08-23: 2.5 mg via RESPIRATORY_TRACT
  Filled 2015-08-23: qty 3

## 2015-08-23 MED ORDER — LEVOFLOXACIN 250 MG PO TABS: ORAL_TABLET | ORAL | 0 refills | Status: DC

## 2015-08-23 NOTE — Assessment & Plan Note (Signed)
Patient was treated for bronchitis several weeks ago with Zithromax pack; but states her symptoms have progressively worsened.  She continues to report chronic tiredness and increased shortness of breath with any exertion whatsoever.  She denies any chest pain, chest pressure, or pain with inspiration.  She also denies any recent fever; but states chills.  She states that she's also had a productive cough with some dark brown secretions.  Exam today revealed some wheezing to the left upper lobes only.  No acute respiratory distress.  On exam.  Patient was afebrile on exam as well.  Chest x-ray obtained today did reveal pneumonia.  Reviewed all findings with Dr. Julien Nordmann; and will prescribe Levaquin at a renal dose.  Patient will be prescribed Levaquin 500 mg on day 1; and will take Levaquin 250 mg daily on an every other day basis until completed.  Patient was advised to call/return or go directly to the emergency department for any worsening symptoms whatsoever.

## 2015-08-23 NOTE — Assessment & Plan Note (Signed)
Patient has a history of chronic kidney disease; and creatinine today has slightly improved and is now 3.2.  Patient has been diagnosed with pneumonia today; and will receive Levaquin at renal dose.

## 2015-08-23 NOTE — Assessment & Plan Note (Signed)
Patient presented to the [facility] today to receive cycle 9, day 8 of her cyclophosphamide/carfilzomib chemotherapy regimen; but it was noted at that time that her platelet count was low at 39 today.  Patient is also feeling increasingly fatigued and is complaining of a productive cough.  She denies any recent fevers or chills.  See further notes for details of today's visit.  Chemotherapy will be held both today and tomorrow; and will reschedule all for Tuesday, 08/30/2015 for labs, visit, and chemotherapy.

## 2015-08-23 NOTE — Progress Notes (Signed)
SYMPTOM MANAGEMENT CLINIC    Chief Complaint: Pneumonia  HPI:  Nancy Norris 75 y.o. female diagnosed with multiple myeloma.  Currently undergoing cyclophosphamide/carfilzomib chemotherapy regimen.  Patient presented to the [facility] today with bronchitis/pneumonia symptoms.    No history exists.    Review of Systems  Constitutional: Positive for chills and malaise/fatigue. Negative for fever.  Respiratory: Positive for cough, sputum production, shortness of breath and wheezing. Negative for hemoptysis.   All other systems reviewed and are negative.   Past Medical History:  Diagnosis Date  . Anemia   . Anginal pain ([place])    used NTG x 2 May 31 and 06/15/13   . Anxiety   . Arthritis   . B12 deficiency 12/04/2014  . Breast cancer ([place])   . CKD (chronic kidney disease) stage 3, GFR 30-59 ml/min   . Complication of anesthesia   . COPD (chronic obstructive pulmonary disease) ([place])   . Depression   . Dizziness   . Fibromyalgia   . Fibromyalgia   . GERD (gastroesophageal reflux disease)   . Headache(784.0)   . History of blood transfusion    last one May 12   . Hx of cardiovascular stress test    LexiScan with low level exercise Myoview (02/2013): No ischemia, EF 72%; normal study  . Hx of echocardiogram    a.  Echocardiogram (12/26/2012): EF 01-74%, grade 1 diastolic dysfunction;   b.  Echocardiogram (02/2013): EF 55-60%, no WMA, trivial effusion  . Hyperkalemia   . Hyponatremia   . Hypothyroidism   . Mucositis   . Multiple myeloma   . Myocardial infarction [facility])    in past, patient was unaware.   . Neuropathy ([place])   . Pain in joint, pelvic region and thigh 07/07/2015  . PONV (postoperative nausea and vomiting) 2008   after mastestomy    Past Surgical History:  Procedure Laterality Date  . ABDOMINAL HYSTERECTOMY  1981  . AV FISTULA PLACEMENT Left 06/19/2013   Procedure: CREATION OF LEFT ARM ARTERIOVENOUS (AV) FISTULA ;  Surgeon: Angelia Mould, MD;   Location: [facility];  Service: Vascular;  Laterality: Left;  . BREAST RECONSTRUCTION    . BREAST SURGERY    . CATARACT EXTRACTION, BILATERAL    . CHOLECYSTECTOMY  1971  . EYE SURGERY Bilateral    lens implant  . history of Port removal    . MASTECTOMY Left 2008  . PORTACATH PLACEMENT  12/2012   has had 2  . Status post stem cell transplant on September 28, 2008.      has Hypercalcemia; HX: breast cancer; Asthma, chronic; Hypothyroid; Chronic diastolic heart failure (HCC); HTN (hypertension); GERD (gastroesophageal reflux disease); COPD (chronic obstructive pulmonary disease) with emphysema ([place]); Fibromyalgia; Mixed hyperlipidemia; Prediabetes; Vitamin D deficiency; Medication management; Depression, controlled; Multiple myeloma ([place]); Orthostatic hypotension; CKD (chronic kidney disease) stage 5, GFR less than 15 ml/min (HCC); Encounter for antineoplastic chemotherapy; CHF (congestive heart failure) ([place]); Malnutrition of moderate degree; B12 deficiency; Thrombocytopenia ([place]); SOB (shortness of breath); Neoplastic malignant related fatigue; Anemia in neoplastic disease; Multiple myeloma not having achieved remission ([place]); Pain in joint, pelvic region and thigh; Right hip pain; Peripheral edema; and Pneumonia on her problem list.    is allergic to codeine; latex; other; onion; zyprexa [olanzapine]; adhesive [tape]; hydrocodone; iodinated diagnostic agents; and sulfa antibiotics.    Medication List       Accurate as of 08/23/15  1:21 PM. Always use your most recent med list.  acetaminophen 325 MG tablet Commonly known as:  TYLENOL Take 650 mg by mouth every 6 (six) hours as needed for mild pain or moderate pain.   albuterol 108 (90 Base) MCG/ACT inhaler Commonly known as:  PROVENTIL HFA;VENTOLIN HFA Inhale 1-2 puffs into the lungs every 6 (six) hours as needed for wheezing or shortness of breath (cough).   ANORO ELLIPTA 62.5-25 MCG/INH Aepb Generic drug:   umeclidinium-vilanterol   ascorbic acid 250 MG Chew Commonly known as:  VITAMIN C Chew 250 mg by mouth daily.   azelastine 0.1 % nasal spray Commonly known as:  ASTELIN Place 2 sprays into both nostrils 2 (two) times daily. Use in each nostril as directed   buPROPion 300 MG 24 hr tablet Commonly known as:  WELLBUTRIN XL Take 1 tablet (300 mg total) by mouth daily.   carboxymethylcellulose 0.5 % Soln Commonly known as:  REFRESH PLUS Place 1 drop into both eyes every 2 (two) hours.   cetirizine 10 MG tablet Commonly known as:  ZYRTEC Take 10 mg by mouth.   Cholecalciferol 4000 units Tabs Take 4,000 Units by mouth daily.   ciprofloxacin 250 MG tablet Commonly known as:  CIPRO Take 1 tablet (250 mg total) by mouth 2 (two) times daily.   citalopram 40 MG tablet Commonly known as:  CELEXA Take 1 tablet daily or as directed   cromolyn 4 % ophthalmic solution Commonly known as:  OPTICROM Place 1 drop into both eyes 4 (four) times daily.   ferrous sulfate 325 (65 FE) MG tablet Take 1 tablet daily or as directed   gabapentin 300 MG capsule Commonly known as:  NEURONTIN TAKE 1 CAPSULE BY MOUTH AT BEDTIME.   isosorbide mononitrate 30 MG 24 hr tablet Commonly known as:  IMDUR Take 1 tablet (30 mg total) by mouth daily.   levofloxacin 250 MG tablet Commonly known as:  LEVAQUIN Take 2 tabs (500 mg) PO on day #1; then take 1 tab (250 mg) PO every other day till gone. (Renal dose).   levothyroxine 150 MCG tablet Commonly known as:  SYNTHROID, LEVOTHROID Take 1 tablet (150 mcg total) by mouth daily.   lidocaine 5 % Commonly known as:  LIDODERM Place 1 patch onto the skin daily. Remove & Discard patch within 12 hours or as directed by MD   loperamide 2 MG capsule Commonly known as:  IMODIUM Take two tabs po initially, then one tab after each loose stool: max 8 tabs in 24 hours   loratadine 10 MG tablet Commonly known as:  CLARITIN Take 10 mg by mouth daily.     LORazepam 0.5 MG tablet Commonly known as:  ATIVAN Take 1 tablet (0.5 mg total) by mouth every 8 (eight) hours as needed (nausea).   loteprednol 0.2 % Susp Commonly known as:  ALREX Place 1 drop into both eyes 4 (four) times daily.   meloxicam 15 MG tablet Commonly known as:  MOBIC Take 1 tablet (15 mg total) by mouth daily.   midodrine 10 MG tablet Commonly known as:  PROAMATINE TAKE 1 TABLET BY MOUTH 3 TIMES DAILY.   nitroGLYCERIN 0.4 MG SL tablet Commonly known as:  NITROSTAT Place 0.4 mg under the tongue every 5 (five) minutes as needed for chest pain. Reported on 03/30/2015   omeprazole 40 MG capsule Commonly known as:  PRILOSEC Take 40 mg by mouth.   ondansetron 4 MG tablet Commonly known as:  ZOFRAN Take 4 mg by mouth.   ondansetron 8 MG disintegrating tablet Commonly  known as:  ZOFRAN-ODT Take 1 tablet (8 mg total) by mouth every 8 (eight) hours as needed for nausea or vomiting.   pantoprazole 40 MG tablet Commonly known as:  PROTONIX Take 1 tablet daily for Acid reflux & indigestion   pilocarpine 5 MG tablet Commonly known as:  SALAGEN Take 1 tablet 3 x / day for dry mouth.   promethazine 25 MG tablet Commonly known as:  PHENERGAN Take 25 mg by mouth.   ranitidine 300 MG tablet Commonly known as:  ZANTAC Take 1 to 2 tablets daily for heartburn & reflux to allow wean and transition from PPI / pantoprazole   senna 8.6 MG tablet Commonly known as:  SENOKOT Take 1 tablet by mouth at bedtime.   traMADol 50 MG tablet Commonly known as:  ULTRAM Take 1 to 2 tablets by mouth 4 times a day as needed for pain.        PHYSICAL EXAMINATION  Oncology Vitals 08/23/2015 08/23/2015  Height 165 cm -  Weight 68.584 kg -  Weight (lbs) 151 lbs 3 oz -  BMI (kg/m2) 25.16 kg/m2 -  Temp 98.1 98.5  Pulse 72 77  Resp 17 20  Resp (Historical as of 08/16/11) - -  SpO2 100 100  BSA (m2) 1.77 m2 -   BP Readings from Last 2 Encounters:  08/23/15 (!) 116/55  08/23/15  117/65    Physical Exam  Constitutional: She is oriented to person, place, and time. She appears malnourished and jaundiced.  HENT:  Head: Normocephalic and atraumatic.  Mouth/Throat: Oropharynx is clear and moist.  Eyes: Conjunctivae and EOM are normal. Pupils are equal, round, and reactive to light. Right eye exhibits no discharge. Left eye exhibits no discharge. No scleral icterus.  Neck: Normal range of motion. Neck supple. No JVD present. No tracheal deviation present. No thyromegaly present.  Cardiovascular: Normal rate, regular rhythm, normal heart sounds and intact distal pulses.   Pulmonary/Chest: Effort normal. She has wheezes.  Abdominal: Soft. Bowel sounds are normal. She exhibits no distension and no mass. There is no tenderness. There is no rebound and no guarding.  Musculoskeletal: Normal range of motion. She exhibits no edema or tenderness.  Lymphadenopathy:    She has no cervical adenopathy.  Neurological: She is alert and oriented to person, place, and time. Gait normal.  Skin: Skin is warm and dry. No rash noted. No erythema. No pallor.  Psychiatric: Affect normal.  Nursing note and vitals reviewed.   LABORATORY DATA:. Appointment on 08/23/2015  Component Date Value Ref Range Status  . WBC 08/23/2015 3.4* 3.9 - 10.3 10e3/uL Final  . NEUT# 08/23/2015 2.1  1.5 - 6.5 10e3/uL Final  . HGB 08/23/2015 9.5* 11.6 - 15.9 g/dL Final  . HCT 08/23/2015 29.1* 34.8 - 46.6 % Final  . Platelets 08/23/2015 39* 145 - 400 10e3/uL Final  . MCV 08/23/2015 101.5* 79.5 - 101.0 fL Final  . MCH 08/23/2015 33.3  25.1 - 34.0 pg Final  . MCHC 08/23/2015 32.8  31.5 - 36.0 g/dL Final  . RBC 08/23/2015 2.87* 3.70 - 5.45 10e6/uL Final  . RDW 08/23/2015 16.7* 11.2 - 14.5 % Final  . lymph# 08/23/2015 0.5* 0.9 - 3.3 10e3/uL Final  . MONO# 08/23/2015 0.6  0.1 - 0.9 10e3/uL Final  . Eosinophils Absolute 08/23/2015 0.1  0.0 - 0.5 10e3/uL Final  . Basophils Absolute 08/23/2015 0.0  0.0 - 0.1  10e3/uL Final  . NEUT% 08/23/2015 63.9  38.4 - 76.8 % Final  . LYMPH%  08/23/2015 13.7* 14.0 - 49.7 % Final  . MONO% 08/23/2015 17.9* 0.0 - 14.0 % Final  . EOS% 08/23/2015 4.1  0.0 - 7.0 % Final  . BASO% 08/23/2015 0.4  0.0 - 2.0 % Final  . Sodium 08/23/2015 144  136 - 145 mEq/L Final  . Potassium 08/23/2015 4.4  3.5 - 5.1 mEq/L Final  . Chloride 08/23/2015 113* 98 - 109 mEq/L Final  . CO2 08/23/2015 23  22 - 29 mEq/L Final  . Glucose 08/23/2015 83  70 - 140 mg/dl Final  . BUN 08/23/2015 42.6* 7.0 - 26.0 mg/dL Final  . Creatinine 08/23/2015 3.2* 0.6 - 1.1 mg/dL Final  . Total Bilirubin 08/23/2015 0.74  0.20 - 1.20 mg/dL Final  . Alkaline Phosphatase 08/23/2015 125  40 - 150 U/L Final  . AST 08/23/2015 19  5 - 34 U/L Final  . ALT 08/23/2015 17  0 - 55 U/L Final  . Total Protein 08/23/2015 6.1* 6.4 - 8.3 g/dL Final  . Albumin 08/23/2015 3.5  3.5 - 5.0 g/dL Final  . Calcium 08/23/2015 10.4  8.4 - 10.4 mg/dL Final  . Anion Gap 08/23/2015 8  3 - 11 mEq/L Final  . EGFR 08/23/2015 14* >90 ml/min/1.73 m2 Final    RADIOGRAPHIC STUDIES: Dg Chest 2 View  Result Date: 08/23/2015 CLINICAL DATA:  History of breast cancer and multiple myeloma. Cough and congestion for 3 weeks. EXAM: CHEST  2 VIEW COMPARISON:  December 02, 2014 FINDINGS: There is a rounded nodular density projected over the left mid lung measuring 2.8 cm in a perihilar location. No other nodules, masses, or focal infiltrates. No pneumothorax. Stable Port-A-Cath. The heart, hila, and mediastinum are unchanged. No other acute abnormalities. IMPRESSION: 1. 2.8 cm nodular density projected over the left perihilar region on only the frontal view. This could represent a rounded pneumonia. However, given the history of cancer, a true pulmonary nodule/ neoplasm is not excluded. This is new since November of 2016. If the patient has symptoms suggesting pneumonia, recommend treatment with short-term follow-up chest x-ray in 2 or 3 weeks. If the  patient does not have symptoms of pneumonia, a CT scan could better evaluate. Electronically Signed   By: Dorise Bullion III M.D   On: 08/23/2015 12:13    ASSESSMENT/PLAN:    Multiple myeloma [facility]) Patient presented to the [facility] today to receive cycle 9, day 8 of her cyclophosphamide/carfilzomib chemotherapy regimen; but it was noted at that time that her platelet count was low at 39 today.  Patient is also feeling increasingly fatigued and is complaining of a productive cough.  She denies any recent fevers or chills.  See further notes for details of today's visit.  Chemotherapy will be held both today and tomorrow; and will reschedule all for Tuesday, 08/30/2015 for labs, visit, and chemotherapy.    CKD (chronic kidney disease) stage 5, GFR less than 15 ml/min (HCC) Patient has a history of chronic kidney disease; and creatinine today has slightly improved and is now 3.2.  Patient has been diagnosed with pneumonia today; and will receive Levaquin at renal dose.  Pneumonia Patient was treated for bronchitis several weeks ago with Zithromax pack; but states her symptoms have progressively worsened.  She continues to report chronic tiredness and increased shortness of breath with any exertion whatsoever.  She denies any chest pain, chest pressure, or pain with inspiration.  She also denies any recent fever; but states chills.  She states that she's also had a productive cough  with some dark brown secretions.  Exam today revealed some wheezing to the left upper lobes only.  No acute respiratory distress.  On exam.  Patient was afebrile on exam as well.  Chest x-ray obtained today did reveal pneumonia.  Reviewed all findings with Dr. Julien Nordmann; and will prescribe Levaquin at a renal dose.  Patient will be prescribed Levaquin 500 mg on day 1; and will take Levaquin 250 mg daily on an every other day basis until completed.  Patient was advised to call/return or go directly to the  emergency department for any worsening symptoms whatsoever.   Patient stated understanding of all instructions; and was in agreement with this plan of care. The patient knows to call the clinic with any problems, questions or concerns.   Total time spent with patient was 25 minutes;  with greater than 75 percent of that time spent in face to face counseling regarding patient's symptoms,  and coordination of care and follow up.  Disclaimer:This dictation was prepared with Dragon/digital dictation along with [employer]. Any transcriptional errors that result from this process are unintentional.  Drue Second, NP 08/23/2015

## 2015-08-23 NOTE — Progress Notes (Signed)
Treatment cancelled today, per Dr. Julien Nordmann. Platelets = 39.

## 2015-08-24 ENCOUNTER — Ambulatory Visit: Payer: Medicare Other

## 2015-08-25 DIAGNOSIS — C9000 Multiple myeloma not having achieved remission: Secondary | ICD-10-CM | POA: Diagnosis not present

## 2015-08-25 DIAGNOSIS — I770 Arteriovenous fistula, acquired: Secondary | ICD-10-CM | POA: Diagnosis not present

## 2015-08-25 DIAGNOSIS — D630 Anemia in neoplastic disease: Secondary | ICD-10-CM | POA: Diagnosis not present

## 2015-08-25 DIAGNOSIS — I951 Orthostatic hypotension: Secondary | ICD-10-CM | POA: Diagnosis not present

## 2015-08-25 DIAGNOSIS — I5030 Unspecified diastolic (congestive) heart failure: Secondary | ICD-10-CM | POA: Diagnosis not present

## 2015-08-25 DIAGNOSIS — R079 Chest pain, unspecified: Secondary | ICD-10-CM | POA: Diagnosis not present

## 2015-08-25 DIAGNOSIS — R809 Proteinuria, unspecified: Secondary | ICD-10-CM | POA: Diagnosis not present

## 2015-08-25 DIAGNOSIS — N184 Chronic kidney disease, stage 4 (severe): Secondary | ICD-10-CM | POA: Diagnosis not present

## 2015-08-25 DIAGNOSIS — N2581 Secondary hyperparathyroidism of renal origin: Secondary | ICD-10-CM | POA: Diagnosis not present

## 2015-08-30 ENCOUNTER — Ambulatory Visit (INDEPENDENT_AMBULATORY_CARE_PROVIDER_SITE_OTHER): Payer: Medicare Other | Admitting: Internal Medicine

## 2015-08-30 ENCOUNTER — Ambulatory Visit (HOSPITAL_BASED_OUTPATIENT_CLINIC_OR_DEPARTMENT_OTHER): Payer: Medicare Other

## 2015-08-30 ENCOUNTER — Encounter: Payer: Self-pay | Admitting: Internal Medicine

## 2015-08-30 ENCOUNTER — Other Ambulatory Visit: Payer: Self-pay | Admitting: *Deleted

## 2015-08-30 ENCOUNTER — Other Ambulatory Visit (HOSPITAL_BASED_OUTPATIENT_CLINIC_OR_DEPARTMENT_OTHER): Payer: Medicare Other

## 2015-08-30 ENCOUNTER — Other Ambulatory Visit: Payer: Self-pay | Admitting: Hematology and Oncology

## 2015-08-30 VITALS — BP 118/54 | HR 66 | Temp 97.9°F | Resp 17

## 2015-08-30 VITALS — BP 118/54 | HR 62 | Temp 98.2°F | Resp 16 | Ht 65.0 in | Wt 152.0 lb

## 2015-08-30 DIAGNOSIS — I951 Orthostatic hypotension: Secondary | ICD-10-CM | POA: Diagnosis not present

## 2015-08-30 DIAGNOSIS — E538 Deficiency of other specified B group vitamins: Secondary | ICD-10-CM | POA: Diagnosis not present

## 2015-08-30 DIAGNOSIS — R609 Edema, unspecified: Secondary | ICD-10-CM

## 2015-08-30 DIAGNOSIS — E039 Hypothyroidism, unspecified: Secondary | ICD-10-CM | POA: Diagnosis not present

## 2015-08-30 DIAGNOSIS — C9000 Multiple myeloma not having achieved remission: Secondary | ICD-10-CM

## 2015-08-30 DIAGNOSIS — Z5112 Encounter for antineoplastic immunotherapy: Secondary | ICD-10-CM | POA: Diagnosis not present

## 2015-08-30 DIAGNOSIS — J189 Pneumonia, unspecified organism: Secondary | ICD-10-CM

## 2015-08-30 DIAGNOSIS — E559 Vitamin D deficiency, unspecified: Secondary | ICD-10-CM

## 2015-08-30 DIAGNOSIS — I509 Heart failure, unspecified: Secondary | ICD-10-CM | POA: Diagnosis not present

## 2015-08-30 DIAGNOSIS — M797 Fibromyalgia: Secondary | ICD-10-CM | POA: Diagnosis not present

## 2015-08-30 DIAGNOSIS — R7303 Prediabetes: Secondary | ICD-10-CM

## 2015-08-30 DIAGNOSIS — Z0000 Encounter for general adult medical examination without abnormal findings: Secondary | ICD-10-CM | POA: Diagnosis not present

## 2015-08-30 DIAGNOSIS — Z79899 Other long term (current) drug therapy: Secondary | ICD-10-CM

## 2015-08-30 DIAGNOSIS — I10 Essential (primary) hypertension: Secondary | ICD-10-CM

## 2015-08-30 DIAGNOSIS — R0602 Shortness of breath: Secondary | ICD-10-CM

## 2015-08-30 DIAGNOSIS — J309 Allergic rhinitis, unspecified: Secondary | ICD-10-CM

## 2015-08-30 DIAGNOSIS — K219 Gastro-esophageal reflux disease without esophagitis: Secondary | ICD-10-CM

## 2015-08-30 DIAGNOSIS — E440 Moderate protein-calorie malnutrition: Secondary | ICD-10-CM

## 2015-08-30 DIAGNOSIS — D696 Thrombocytopenia, unspecified: Secondary | ICD-10-CM

## 2015-08-30 DIAGNOSIS — F332 Major depressive disorder, recurrent severe without psychotic features: Secondary | ICD-10-CM | POA: Diagnosis not present

## 2015-08-30 DIAGNOSIS — N185 Chronic kidney disease, stage 5: Secondary | ICD-10-CM

## 2015-08-30 DIAGNOSIS — J438 Other emphysema: Secondary | ICD-10-CM

## 2015-08-30 DIAGNOSIS — D630 Anemia in neoplastic disease: Secondary | ICD-10-CM

## 2015-08-30 DIAGNOSIS — I5032 Chronic diastolic (congestive) heart failure: Secondary | ICD-10-CM

## 2015-08-30 DIAGNOSIS — R530 Neoplastic (malignant) related fatigue: Secondary | ICD-10-CM

## 2015-08-30 DIAGNOSIS — E782 Mixed hyperlipidemia: Secondary | ICD-10-CM

## 2015-08-30 DIAGNOSIS — J181 Lobar pneumonia, unspecified organism: Secondary | ICD-10-CM

## 2015-08-30 DIAGNOSIS — Z853 Personal history of malignant neoplasm of breast: Secondary | ICD-10-CM

## 2015-08-30 LAB — CBC WITH DIFFERENTIAL/PLATELET
BASO%: 1.1 % (ref 0.0–2.0)
Basophils Absolute: 0 10*3/uL (ref 0.0–0.1)
EOS ABS: 0.1 10*3/uL (ref 0.0–0.5)
EOS%: 4.7 % (ref 0.0–7.0)
HCT: 27.2 % — ABNORMAL LOW (ref 34.8–46.6)
HGB: 8.8 g/dL — ABNORMAL LOW (ref 11.6–15.9)
LYMPH%: 25.3 % (ref 14.0–49.7)
MCH: 33.4 pg (ref 25.1–34.0)
MCHC: 32.2 g/dL (ref 31.5–36.0)
MCV: 103.8 fL — AB (ref 79.5–101.0)
MONO#: 0.8 10*3/uL (ref 0.1–0.9)
MONO%: 28.2 % — ABNORMAL HIGH (ref 0.0–14.0)
NEUT%: 40.7 % (ref 38.4–76.8)
NEUTROS ABS: 1.2 10*3/uL — AB (ref 1.5–6.5)
PLATELETS: 112 10*3/uL — AB (ref 145–400)
RBC: 2.62 10*6/uL — AB (ref 3.70–5.45)
RDW: 18.2 % — ABNORMAL HIGH (ref 11.2–14.5)
WBC: 3 10*3/uL — ABNORMAL LOW (ref 3.9–10.3)
lymph#: 0.7 10*3/uL — ABNORMAL LOW (ref 0.9–3.3)

## 2015-08-30 LAB — COMPREHENSIVE METABOLIC PANEL
ALT: 20 U/L (ref 0–55)
ANION GAP: 9 meq/L (ref 3–11)
AST: 21 U/L (ref 5–34)
Albumin: 3.6 g/dL (ref 3.5–5.0)
Alkaline Phosphatase: 109 U/L (ref 40–150)
BILIRUBIN TOTAL: 1.34 mg/dL — AB (ref 0.20–1.20)
BUN: 38.4 mg/dL — ABNORMAL HIGH (ref 7.0–26.0)
CALCIUM: 9.7 mg/dL (ref 8.4–10.4)
CO2: 25 meq/L (ref 22–29)
Chloride: 107 mEq/L (ref 98–109)
Creatinine: 3.1 mg/dL (ref 0.6–1.1)
EGFR: 14 mL/min/{1.73_m2} — AB (ref >90)
Glucose: 95 mg/dl (ref 70–140)
Potassium: 3.6 mEq/L (ref 3.5–5.1)
Sodium: 141 mEq/L (ref 136–145)
TOTAL PROTEIN: 6.1 g/dL — AB (ref 6.4–8.3)

## 2015-08-30 LAB — TSH: TSH: 14.78 mIU/L — ABNORMAL HIGH

## 2015-08-30 MED ORDER — SODIUM CHLORIDE 0.9 % IV SOLN
Freq: Once | INTRAVENOUS | Status: AC
  Administered 2015-08-30: 12:00:00 via INTRAVENOUS

## 2015-08-30 MED ORDER — FLUTICASONE FUROATE-VILANTEROL 100-25 MCG/INH IN AEPB: 1.0000 | INHALATION_SPRAY | Freq: Every day | RESPIRATORY_TRACT | 0 refills | Status: DC

## 2015-08-30 MED ORDER — SODIUM CHLORIDE 0.9 % IV SOLN
Freq: Once | INTRAVENOUS | Status: AC
  Administered 2015-08-30: 13:00:00 via INTRAVENOUS
  Filled 2015-08-30: qty 4

## 2015-08-30 MED ORDER — SODIUM CHLORIDE 0.9 % IJ SOLN
10.0000 mL | INTRAMUSCULAR | Status: DC | PRN
  Administered 2015-08-30: 10 mL
  Filled 2015-08-30: qty 10

## 2015-08-30 MED ORDER — ALBUTEROL SULFATE HFA 108 (90 BASE) MCG/ACT IN AERS: 1.0000 | INHALATION_SPRAY | Freq: Four times a day (QID) | RESPIRATORY_TRACT | 3 refills | Status: DC | PRN

## 2015-08-30 MED ORDER — HEPARIN SOD (PORK) LOCK FLUSH 100 UNIT/ML IV SOLN
500.0000 [IU] | Freq: Once | INTRAVENOUS | Status: AC | PRN
  Administered 2015-08-30: 500 [IU]
  Filled 2015-08-30: qty 5

## 2015-08-30 MED ORDER — SODIUM CHLORIDE 0.9 % IV SOLN
300.0000 mg/m2 | Freq: Once | INTRAVENOUS | Status: AC
  Administered 2015-08-30: 560 mg via INTRAVENOUS
  Filled 2015-08-30: qty 28

## 2015-08-30 MED ORDER — DEXTROSE 5 % IV SOLN
37.0000 mg/m2 | Freq: Once | INTRAVENOUS | Status: AC
  Administered 2015-08-30: 70 mg via INTRAVENOUS
  Filled 2015-08-30: qty 5

## 2015-08-30 MED ORDER — SODIUM CHLORIDE 0.9 % IV SOLN
Freq: Once | INTRAVENOUS | Status: AC
  Administered 2015-08-30: 13:00:00 via INTRAVENOUS

## 2015-08-30 NOTE — Progress Notes (Signed)
Patient afebrile with ANC of 1.2 and WBC of3.0. Reviewed counts with on call Dr Lindi Adie.  Okay to treat with counts, reviewed neutropenic precautions with patient and instructed to call with fever and or chills.

## 2015-08-30 NOTE — Patient Instructions (Signed)
[facility] Discharge Instructions for Patients Receiving Chemotherapy  Today you received the following chemotherapy agents: Kyprolis and Cytoxan.  To help prevent nausea and vomiting after your treatment, we encourage you to take your nausea medication:Zofran 8 mg every 8 hours as needed.   If you develop nausea and vomiting that is not controlled by your nausea medication, call the clinic.   BELOW ARE SYMPTOMS THAT SHOULD BE REPORTED IMMEDIATELY:  *FEVER GREATER THAN 100.5 F  *CHILLS WITH OR WITHOUT FEVER  NAUSEA AND VOMITING THAT IS NOT CONTROLLED WITH YOUR NAUSEA MEDICATION  *UNUSUAL SHORTNESS OF BREATH  *UNUSUAL BRUISING OR BLEEDING  TENDERNESS IN MOUTH AND THROAT WITH OR WITHOUT PRESENCE OF ULCERS  *URINARY PROBLEMS  *BOWEL PROBLEMS  UNUSUAL RASH Items with * indicate a potential emergency and should be followed up as soon as possible.  Feel free to call the clinic you have any questions or concerns. The clinic phone number is [phone].  Please show the [facility] at check-in to the Emergency Department and triage nurse.

## 2015-08-30 NOTE — Progress Notes (Signed)
MEDICARE ANNUAL WELLNESS VISIT AND FOLLOW UP  Assessment:    1. Severe episode of recurrent major depressive disorder, without psychotic features ([place]) -recommended seeking counseling with therapists at the cancer center -would likely benefit from possible abilify use however given cancer treatments and Stage 5 CKD recommend that she see psych for medication management - Ambulatory referral to Psychiatry  2. Hypothyroidism, unspecified hypothyroidism type -cont levothyroxine - TSH  3. Allergic rhinitis, unspecified allergic rhinitis type -cont meds - albuterol (PROVENTIL HFA;VENTOLIN HFA) 108 (90 Base) MCG/ACT inhaler; Inhale 1-2 puffs into the lungs every 6 (six) hours as needed for wheezing or shortness of breath (cough).  Dispense: 1 Inhaler; Refill: 3  4. Chronic diastolic heart failure (HCC) -cont tight control of HTN -cont meds  5. Essential hypertension -well controlled -cont meds -dash diet -exercise as tolerated  6. Orthostatic hypotension -treated with fluids at cancer center as needed -get up slowly during positional changes  7. Congestive heart failure, unspecified congestive heart failure chronicity, unspecified congestive heart failure type ([place]) -cont blood pressure control -cont meds  8. Other emphysema ([place]) -cont anoro once course of breo is completed  9. Left upper lobe pneumonia -finish abx -repeat CXR in 3-4 weeks -if no improvement CT scan of chest -albuterol q6hours as needed for shortness of breath -breo once daily x 1 month to help with SOB  10. Gastroesophageal reflux disease, esophagitis presence not specified -cont PPI -avoid trigger foods  11. B12 deficiency -cont supplement  12. Fibromyalgia -exercise as tolerated -get depression to remission  13. CKD (chronic kidney disease) stage 5, GFR less than 15 ml/min (HCC) -monitored by BMET  14. Multiple myeloma not having achieved remission ([place]) -followed by Dr. Julien Nordmann  15.  Hypercalcemia -monitor BMET -avoid calcium supplements  16. HX: breast cancer -followed by oncology  31. Mixed hyperlipidemia -cont meds -diet and exercise  18. Prediabetes -cont diet and exercise  19. Vitamin D deficiency -cont Vit D  20. Medication management -cont lab monitoring by oncology  21. Malnutrition of moderate degree -increase calorie intake -boost or ensure prn  22. Thrombocytopenia ([place]) -follow platelets on CBC  23. SOB (shortness of breath) -cont breo -cont albuterol  24. Neoplastic malignant related fatigue -monitored by oncology  25. Anemia in neoplastic disease -monitored by oncology  26. Peripheral edema -cont diuretics prn   Over 30 minutes of exam, counseling, chart review, and critical decision making was performed  Future Appointments Date Time Provider [place]  08/30/2015 11:00 AM [facility] LAB 2 [facility] None  08/30/2015 11:30 AM [facility] E14 [facility] None  08/31/2015 8:30 AM [facility] B4 [facility] None  09/13/2015 9:00 AM Curt Bears, MD [facility] None  12/07/2015 9:45 AM [name], MD [facility] None  01/04/2016 9:30 AM Deneise Lever, MD [facility] None  06/19/2016 10:00 AM [name], MD [facility] None    Plan:   During the course of the visit the patient was educated and counseled about appropriate screening and preventive services including:    Pneumococcal vaccine   Influenza vaccine  Td vaccine  Prevnar 13  Screening electrocardiogram  Screening mammography  Bone densitometry screening  Colorectal cancer screening  Diabetes screening  Glaucoma screening  Nutrition counseling   Advanced directives: given info/requested copies   Subjective:   Nancy Norris is a 75 y.o. female who presents for Medicare Annual Wellness Visit and 3 month follow up on hypertension, prediabetes, hyperlipidemia, vitamin D def.   Her blood pressure has been controlled at home,  today  their BP is BP: (!) 118/54 She does workout. She denies chest pain, shortness of breath, dizziness.   She is on cholesterol medication and denies myalgias. Her cholesterol is at goal. The cholesterol last visit was:   Lab Results  Component Value Date   CHOL 157 05/27/2015   HDL 39 (L) 05/27/2015   LDLCALC 80 05/27/2015   TRIG 188 (H) 05/27/2015   CHOLHDL 4.0 05/27/2015   She has a history of prediabetes controlled with diet and exercise.  She is doing well.   Lab Results  Component Value Date   HGBA1C 5.4 05/27/2015   Last GFR Lab Results  Component Value Date   GFRNONAA 12 (L) 05/27/2015   Lab Results  Component Value Date   GFRAA 13 (L) 05/27/2015   Patient is on Vitamin D supplement. Lab Results  Component Value Date   VD25OH 17 05/27/2015     She is currently being treated for multiple myeloma.  She is also was recently diagnosed with pneumonia due to her chemotherapy.  She is currently on antibiotics for this.  She reports that she is still feeling sick.  She is still very short of breath.  She reports that she is a former smoker.  She has used nebulizers in the past.  She has been using her albuterol once to twice daily.    She reports that she is struggling a lot with her depression.  She feels like the celexa and her wellbutrin and she does not feel like it is helping at all.  She sleep all the time.  She is not interested in doing anything.  She has not done any kind of counseling and therapy.     She has had a lot of recent falls.  She does have neuropathy in her feet and also has issues with her knee.  She refuses to use either a cane or a walker.        Medication Review Current Outpatient Prescriptions on File Prior to Visit  Medication Sig Dispense Refill  . acetaminophen (TYLENOL) 325 MG tablet Take 650 mg by mouth every 6 (six) hours as needed for mild pain or moderate pain.     Marland Kitchen albuterol (PROVENTIL HFA;VENTOLIN HFA) 108 (90 Base) MCG/ACT inhaler  Inhale 1-2 puffs into the lungs every 6 (six) hours as needed for wheezing or shortness of breath (cough). 1 Inhaler 0  . ANORO ELLIPTA 62.5-25 MCG/INH AEPB   0  . ascorbic acid (VITAMIN C) 250 MG CHEW Chew 250 mg by mouth daily.    Marland Kitchen azelastine (ASTELIN) 0.1 % nasal spray Place 2 sprays into both nostrils 2 (two) times daily. Use in each nostril as directed 30 mL 2  . buPROPion (WELLBUTRIN XL) 300 MG 24 hr tablet Take 1 tablet (300 mg total) by mouth daily. 90 tablet 1  . carboxymethylcellulose (REFRESH PLUS) 0.5 % SOLN Place 1 drop into both eyes every 2 (two) hours.    . cetirizine (ZYRTEC) 10 MG tablet Take 10 mg by mouth.    . Cholecalciferol 4000 UNITS TABS Take 4,000 Units by mouth daily.    . ciprofloxacin (CIPRO) 250 MG tablet Take 1 tablet (250 mg total) by mouth 2 (two) times daily. 10 tablet 0  . citalopram (CELEXA) 40 MG tablet Take 1 tablet daily or as directed 90 tablet 1  . cromolyn (OPTICROM) 4 % ophthalmic solution Place 1 drop into both eyes 4 (four) times daily.    . ferrous sulfate 325 (65 FE)  MG tablet Take 1 tablet daily or as directed 90 tablet 1  . gabapentin (NEURONTIN) 300 MG capsule TAKE 1 CAPSULE BY MOUTH AT BEDTIME. 90 capsule 1  . isosorbide mononitrate (IMDUR) 30 MG 24 hr tablet Take 1 tablet (30 mg total) by mouth daily. 90 tablet 3  . levofloxacin (LEVAQUIN) 250 MG tablet Take 2 tabs (500 mg) PO on day #1; then take 1 tab (250 mg) PO every other day till gone. (Renal dose). 7 tablet 0  . levothyroxine (SYNTHROID, LEVOTHROID) 150 MCG tablet Take 1 tablet (150 mcg total) by mouth daily. 90 tablet 1  . lidocaine (LIDODERM) 5 % Place 1 patch onto the skin daily. Remove & Discard patch within 12 hours or as directed by MD 30 patch 0  . loperamide (IMODIUM) 2 MG capsule Take two tabs po initially, then one tab after each loose stool: max 8 tabs in 24 hours 30 capsule 0  . loratadine (CLARITIN) 10 MG tablet Take 10 mg by mouth daily.    Marland Kitchen LORazepam (ATIVAN) 0.5 MG tablet  Take 1 tablet (0.5 mg total) by mouth every 8 (eight) hours as needed (nausea). 30 tablet 0  . loteprednol (ALREX) 0.2 % SUSP Place 1 drop into both eyes 4 (four) times daily. 1 Bottle PRN  . meloxicam (MOBIC) 15 MG tablet Take 1 tablet (15 mg total) by mouth daily. 90 tablet 3  . midodrine (PROAMATINE) 10 MG tablet TAKE 1 TABLET BY MOUTH 3 TIMES DAILY. 270 tablet 3  . nitroGLYCERIN (NITROSTAT) 0.4 MG SL tablet Place 0.4 mg under the tongue every 5 (five) minutes as needed for chest pain. Reported on 03/30/2015    . omeprazole (PRILOSEC) 40 MG capsule Take 40 mg by mouth.    . ondansetron (ZOFRAN-ODT) 8 MG disintegrating tablet Take 1 tablet (8 mg total) by mouth every 8 (eight) hours as needed for nausea or vomiting. 90 tablet 0  . pantoprazole (PROTONIX) 40 MG tablet Take 1 tablet daily for Acid reflux & indigestion 90 tablet 1  . pilocarpine (SALAGEN) 5 MG tablet Take 1 tablet 3 x / day for dry mouth. 270 tablet 1  . promethazine (PHENERGAN) 25 MG tablet Take 25 mg by mouth.    . ranitidine (ZANTAC) 300 MG tablet Take 1 to 2 tablets daily for heartburn & reflux to allow wean and transition from PPI / pantoprazole 180 tablet 3  . senna (SENOKOT) 8.6 MG tablet Take 1 tablet by mouth at bedtime.    . traMADol (ULTRAM) 50 MG tablet Take 1 to 2 tablets by mouth 4 times a day as needed for pain. 180 tablet 0   Current Facility-Administered Medications on File Prior to Visit  Medication Dose Route Frequency Provider Last Rate Last Dose  . 0.9 %  sodium chloride infusion   Intravenous Once Adrena E Johnson, PA-C      . acetaminophen (TYLENOL) tablet 650 mg  650 mg Oral Once Susanne Borders, NP      . sodium chloride 0.9 % injection 10 mL  10 mL Intracatheter PRN Curt Bears, MD   10 mL at 01/05/13 1825  . sodium chloride 0.9 % injection 10 mL  10 mL Intracatheter PRN Curt Bears, MD   10 mL at 06/15/14 1514    Allergies: Allergies  Allergen Reactions  . Codeine Anaphylaxis  . Latex  Shortness Of Breath    Adhesive products   . Other Other (See Comments)    Onion, chocolate causes migraines  .  Onion Other (See Comments)    Causes migraine headaches  . Zyprexa [Olanzapine] Other (See Comments)    Confusion , dizzy,unsteady  . Adhesive [Tape] Other (See Comments)    blisters  . Hydrocodone Rash    With extreme itching  . Iodinated Diagnostic Agents Itching, Rash and Hives    Happened 60 years ago  . Sulfa Antibiotics Itching    Current Problems (verified) has Hypercalcemia; HX: breast cancer; Asthma, chronic; Hypothyroid; Chronic diastolic heart failure (HCC); HTN (hypertension); GERD (gastroesophageal reflux disease); COPD (chronic obstructive pulmonary disease) with emphysema ([place]); Fibromyalgia; Mixed hyperlipidemia; Prediabetes; Vitamin D deficiency; Medication management; Depression, controlled; Multiple myeloma ([place]); Orthostatic hypotension; CKD (chronic kidney disease) stage 5, GFR less than 15 ml/min (HCC); Encounter for antineoplastic chemotherapy; CHF (congestive heart failure) ([place]); Malnutrition of moderate degree; B12 deficiency; Thrombocytopenia ([place]); SOB (shortness of breath); Neoplastic malignant related fatigue; Anemia in neoplastic disease; Multiple myeloma not having achieved remission ([place]); Pain in joint, pelvic region and thigh; Right hip pain; Peripheral edema; and Pneumonia on her problem list.  Screening Tests Immunization History  Administered Date(s) Administered  . Influenza, High Dose Seasonal PF 09/30/2013  . Influenza-Unspecified 11/05/2012, 11/03/2014  . Pneumococcal Conjugate-13 10/05/2013  . Pneumococcal-Unspecified 03/16/2011  . Tdap 10/14/2009    Preventative care: Last colonoscopy: 2012 Last mammogram: 4/16 DEXA:2016  Prior vaccinations: TD or Tdap: 2011  Influenza: 2016  Pneumococcal: 2013 Prevnar13: 2015 Shingles/Zostavax: Not a candidate secondary to chemotherapy  Names of Other Physician/Practitioners you  currently use: 1. [facility]- here for primary care 2. Dr. Delman Cheadle, eye doctor, last visit 2017 3. Does not see , dentist, last visit 2015 Patient Care Team: [name], MD as PCP - General (Internal Medicine) Donato Heinz, MD as Referring Physician (Nephrology) Curt Bears, MD as Consulting Physician (Oncology) Wonda Horner, MD as Consulting Physician (Gastroenterology) Sharyne Peach, MD as Consulting Physician (Ophthalmology)  Surgical: She  has a past surgical history that includes history of Port removal; Status post stem cell transplant on September 28, 2008.; Abdominal hysterectomy (1981); Cholecystectomy (1971); Mastectomy (Left, 2008); Cataract extraction, bilateral; Breast surgery; Eye surgery (Bilateral); Breast reconstruction; Portacath placement (12/2012); and AV fistula placement (Left, 06/19/2013). Family Her family history includes Arthritis in her mother; Asthma in her mother; Cancer in her sister; Hyperlipidemia in her brother. Social history  She reports that she quit smoking about 10 years ago. Her smoking use included Cigarettes. She has a 30.00 pack-year smoking history. She has never used smokeless tobacco. She reports that she does not drink alcohol or use drugs.  MEDICARE WELLNESS OBJECTIVES: Physical activity:   Cardiac risk factors:   Depression/mood screen:   Depression screen [facility] 2/9 05/27/2015  Decreased Interest 1  Down, Depressed, Hopeless 1  PHQ - 2 Score 2  Altered sleeping 1  Tired, decreased energy 1  Change in appetite 1  Feeling bad or failure about yourself  0  Trouble concentrating 1  Moving slowly or fidgety/restless 0  Suicidal thoughts 0  PHQ-9 Score 6  Difficult doing work/chores Not difficult at all  Some recent data might be hidden    ADLs:  In your present state of health, do you have any difficulty performing the following activities: 05/27/2015  Hearing? N  Vision? N  Difficulty  concentrating or making decisions? N  Walking or climbing stairs? N  Dressing or bathing? N  Doing errands, shopping? N  Some recent data might be hidden     Cognitive Testing  Alert?  Yes  Normal Appearance?Yes  Oriented to person? Yes  Place? Yes   Time? Yes  Recall of three objects?  Yes  Can perform simple calculations? Yes  Displays appropriate judgment?Yes  Can read the correct time from a watch face?Yes  EOL planning:     Objective:   Today's Vitals   08/30/15 0946  BP: (!) 118/54  Pulse: 62  Resp: 16  Temp: 98.2 F (36.8 C)  TempSrc: Temporal  Weight: 152 lb (68.9 kg)  Height: _0  (1.651 m)   Body mass index is 25.29 kg/m.  General appearance: alert, chronically ill appearing, no distress, WD/WN,  female HEENT: normocephalic, sclerae anicteric, TMs pearly, nares patent, no discharge or erythema, pharynx normal Oral cavity: MMM, no lesions Neck: supple, no lymphadenopathy, no thyromegaly, no masses Heart: RRR, normal S1, S2, 2/6 murmur, dialysis graft in left forearm with palpable thrill Lungs: CTA bilaterally, no wheezes, rhonchi, or rales, no increased work or breathing Abdomen: +bs, soft, non tender, non distended, no masses, no hepatomegaly, no splenomegaly Musculoskeletal: nontender, no swelling, no obvious deformity Extremities: no edema, no cyanosis, no clubbing Pulses: 2+ symmetric, upper and lower extremities, normal cap refill Neurological: alert, oriented x 3, CN2-12 intact, strength normal upper extremities and lower extremities, sensation normal throughout, DTRs 2+ throughout, no cerebellar signs, gait normal Psychiatric: normal affect, behavior normal, pleasant  Breast: defer Gyn: defer Rectal: defer   Medicare Attestation I have personally reviewed: The patient's medical and social history Their use of alcohol, tobacco or illicit drugs Their current medications and supplements The patient's functional ability including ADLs,fall risks,  home safety risks, cognitive, and hearing and visual impairment Diet and physical activities Evidence for depression or mood disorders  The patient's weight, height, BMI, and visual acuity have been recorded in the chart.  I have made referrals, counseling, and provided education to the patient based on review of the above and I have provided the patient with a written personalized care plan for preventive services.     Starlyn Skeans, PA-C   08/30/2015

## 2015-08-30 NOTE — Patient Instructions (Signed)
Stop the anoro and use breo once daily to help with the shortness of breath.  Please restart the anoro once you finish the breo inhaler.    Please use your albuterol inhaler every 6 hours whether you need it or not until the shortness of breath resolves.

## 2015-08-31 ENCOUNTER — Ambulatory Visit (HOSPITAL_BASED_OUTPATIENT_CLINIC_OR_DEPARTMENT_OTHER): Payer: Medicare Other

## 2015-08-31 VITALS — BP 128/62 | HR 83 | Temp 98.3°F | Resp 18

## 2015-08-31 DIAGNOSIS — C9000 Multiple myeloma not having achieved remission: Secondary | ICD-10-CM

## 2015-08-31 DIAGNOSIS — Z5112 Encounter for antineoplastic immunotherapy: Secondary | ICD-10-CM

## 2015-08-31 MED ORDER — DEXTROSE 5 % IV SOLN: 37.0000 mg/m2 | Freq: Once | INTRAVENOUS | Status: DC

## 2015-08-31 MED ORDER — DEXTROSE 5 % IV SOLN
37.0000 mg/m2 | Freq: Once | INTRAVENOUS | Status: AC
  Administered 2015-08-31: 70 mg via INTRAVENOUS
  Filled 2015-08-31: qty 20

## 2015-08-31 MED ORDER — SODIUM CHLORIDE 0.9 % IJ SOLN
10.0000 mL | INTRAMUSCULAR | Status: DC | PRN
  Administered 2015-08-31: 10 mL
  Filled 2015-08-31: qty 10

## 2015-08-31 MED ORDER — SODIUM CHLORIDE 0.9 % IV SOLN
Freq: Once | INTRAVENOUS | Status: AC
  Administered 2015-08-31: 09:00:00 via INTRAVENOUS

## 2015-08-31 MED ORDER — HEPARIN SOD (PORK) LOCK FLUSH 100 UNIT/ML IV SOLN
500.0000 [IU] | Freq: Once | INTRAVENOUS | Status: AC | PRN
  Administered 2015-08-31: 500 [IU]
  Filled 2015-08-31: qty 5

## 2015-08-31 MED ORDER — SODIUM CHLORIDE 0.9 % IV SOLN
Freq: Once | INTRAVENOUS | Status: AC
  Administered 2015-08-31: 09:00:00 via INTRAVENOUS
  Filled 2015-08-31: qty 4

## 2015-08-31 MED ORDER — SODIUM CHLORIDE 0.9 % IV SOLN
Freq: Once | INTRAVENOUS | Status: AC
  Administered 2015-08-31: 10:00:00 via INTRAVENOUS

## 2015-08-31 NOTE — Progress Notes (Signed)
Neutropenic precautions education provided. Pt verbalized understanding and was given a mask to wear home. Pt refused AVS.

## 2015-08-31 NOTE — Patient Instructions (Addendum)
[facility] Discharge Instructions for Patients Receiving Chemotherapy  Today you received the following chemotherapy agents: Kyprolis.  To help prevent nausea and vomiting after your treatment, we encourage you to take your nausea medication:Zofran 8 mg every 8 hours as needed.   If you develop nausea and vomiting that is not controlled by your nausea medication, call the clinic.   BELOW ARE SYMPTOMS THAT SHOULD BE REPORTED IMMEDIATELY:  *FEVER GREATER THAN 100.5 F  *CHILLS WITH OR WITHOUT FEVER  NAUSEA AND VOMITING THAT IS NOT CONTROLLED WITH YOUR NAUSEA MEDICATION  *UNUSUAL SHORTNESS OF BREATH  *UNUSUAL BRUISING OR BLEEDING  TENDERNESS IN MOUTH AND THROAT WITH OR WITHOUT PRESENCE OF ULCERS  *URINARY PROBLEMS  *BOWEL PROBLEMS  UNUSUAL RASH Items with * indicate a potential emergency and should be followed up as soon as possible.  Feel free to call the clinic you have any questions or concerns. The clinic phone number is [phone].  Please show the [facility] at check-in to the Emergency Department and triage nurse.  Neutropenia Neutropenia is a condition that occurs when the level of a certain type of white blood cell (neutrophil) in your body becomes lower than normal. Neutrophils are made in the bone marrow and fight infections. These cells protect against bacteria and viruses. The fewer neutrophils you have, and the longer your body remains without them, the greater your risk of getting a severe infection becomes. CAUSES  The cause of neutropenia may be hard to determine. However, it is usually due to 3 main problems:   Decreased production of neutrophils. This may be due to:  Certain medicines such as chemotherapy.  Genetic problems.  Cancer.  Radiation treatments.  Vitamin deficiency.  Some pesticides.  Increased destruction of neutrophils. This may be due to:  Overwhelming infections.  Hemolytic anemia. This is when the body  destroys its own blood cells.  Chemotherapy.  Neutrophils moving to areas of the body where they cannot fight infections. This may be due to:  Dialysis procedures.  Conditions where the spleen becomes enlarged. Neutrophils are held in the spleen and are not available to the rest of the body.  Overwhelming infections. The neutrophils are held in the area of the infection and are not available to the rest of the body. SYMPTOMS  There are no specific symptoms of neutropenia. The lack of neutrophils can result in an infection, and an infection can cause various problems. DIAGNOSIS  Diagnosis is made by a blood test. A complete blood count is performed. The normal level of neutrophils in human blood differs with age and race. Infants have lower counts than older children and adults. African Americans have lower counts than Caucasians or Asians. The average adult level is 1500 cells/mm3 of blood. Neutrophil counts are interpreted as follows:  Greater than 1000 cells/mm3 gives normal protection against infection.  500 to 1000 cells/mm3 gives an increased risk for infection.  200 to 500 cells/mm3 is a greater risk for severe infection.  Lower than 200 cells/mm3 is a marked risk of infection. This may require hospitalization and treatment with antibiotic medicines. TREATMENT  Treatment depends on the underlying cause, severity, and presence of infections or symptoms. It also depends on your health. Your caregiver will discuss the treatment plan with you. Mild cases are often easily treated and have a good outcome. Preventative measures may also be started to limit your risk of infections. Treatment can include:  Taking antibiotics.  Stopping medicines that are known to cause  neutropenia.  Correcting nutritional deficiencies by eating green vegetables to supply folic acid and taking vitamin B supplements.  Stopping exposure to pesticides if your neutropenia is related to pesticide  exposure.  Taking a blood growth factor called sargramostim, pegfilgrastim, or filgrastim if you are undergoing chemotherapy for cancer. This stimulates white blood cell production.  Removal of the spleen if you have Felty's syndrome and have repeated infections. HOME CARE INSTRUCTIONS   Follow your caregiver's instructions about when you need to have blood work done.  Wash your hands often. Make sure others who come in contact with you also wash their hands.  Wash raw fruits and vegetables before eating them. They can carry bacteria and fungi.  Avoid people with colds or spreadable (contagious) diseases (chickenpox, herpes zoster, influenza).  Avoid large crowds.  Avoid construction areas. The dust can release fungus into the air.  Be cautious around children in daycare or school environments.  Take care of your respiratory system by coughing and deep breathing.  Bathe daily.  Protect your skin from cuts and burns.  Do not work in the garden or with flowers and plants.  Care for the mouth before and after meals by brushing with a soft toothbrush. If you have mucositis, do not use mouthwash. Mouthwash contains alcohol and can dry out the mouth even more.  Clean the area between the genitals and the anus (perineal area) after urination and bowel movements. Women need to wipe from front to back.  Use a water soluble lubricant during sexual intercourse and practice good hygiene after. Do not have intercourse if you are severely neutropenic. Check with your caregiver for guidelines.  Exercise daily as tolerated.  Avoid people who were vaccinated with a live vaccine in the past 30 days. You should not receive live vaccines (polio, typhoid).  Do not provide direct care for pets. Avoid animal droppings. Do not clean litter boxes and bird cages.  Do not share food utensils.  Do not use tampons, enemas, or rectal suppositories unless directed by your caregiver.  Use an electric  razor to remove hair.  Wash your hands after handling magazines, letters, and newspapers. SEEK IMMEDIATE MEDICAL CARE IF:   You have a fever.  You have chills or start to shake.  You feel nauseous or vomit.  You develop mouth sores.  You develop aches and pains.  You have redness and swelling around open wounds.  Your skin is warm to the touch.  You have pus coming from your wounds.  You develop swollen lymph nodes.  You feel weak or fatigued.  You develop red streaks on the skin. MAKE SURE YOU:  Understand these instructions.  Will watch your condition.  Will get help right away if you are not doing well or get worse.   This information is not intended to replace advice given to you by your health care provider. Make sure you discuss any questions you have with your health care provider.   Document Released: 06/23/2001 Document Revised: 03/26/2011 Document Reviewed: 07/14/2014 Elsevier Interactive Patient Education [employer].

## 2015-09-07 ENCOUNTER — Other Ambulatory Visit: Payer: Self-pay | Admitting: Internal Medicine

## 2015-09-07 DIAGNOSIS — K219 Gastro-esophageal reflux disease without esophagitis: Secondary | ICD-10-CM

## 2015-09-13 ENCOUNTER — Other Ambulatory Visit: Payer: Self-pay | Admitting: Medical Oncology

## 2015-09-13 ENCOUNTER — Ambulatory Visit (HOSPITAL_COMMUNITY)
Admission: RE | Admit: 2015-09-13 | Discharge: 2015-09-13 | Disposition: A | Discharge status: Home / Self Care | Payer: Medicare Other | Source: Ambulatory Visit | Attending: Internal Medicine | Admitting: Internal Medicine

## 2015-09-13 ENCOUNTER — Encounter: Payer: Self-pay | Admitting: Internal Medicine

## 2015-09-13 ENCOUNTER — Ambulatory Visit (HOSPITAL_BASED_OUTPATIENT_CLINIC_OR_DEPARTMENT_OTHER): Payer: Medicare Other | Admitting: Internal Medicine

## 2015-09-13 ENCOUNTER — Other Ambulatory Visit: Payer: Self-pay | Admitting: Internal Medicine

## 2015-09-13 VITALS — BP 111/49 | HR 80 | Temp 99.4°F | Resp 18 | Ht 65.0 in | Wt 160.0 lb

## 2015-09-13 DIAGNOSIS — N189 Chronic kidney disease, unspecified: Secondary | ICD-10-CM | POA: Diagnosis not present

## 2015-09-13 DIAGNOSIS — I517 Cardiomegaly: Secondary | ICD-10-CM | POA: Insufficient documentation

## 2015-09-13 DIAGNOSIS — R0602 Shortness of breath: Secondary | ICD-10-CM | POA: Diagnosis not present

## 2015-09-13 DIAGNOSIS — R079 Chest pain, unspecified: Secondary | ICD-10-CM | POA: Diagnosis not present

## 2015-09-13 DIAGNOSIS — D631 Anemia in chronic kidney disease: Secondary | ICD-10-CM

## 2015-09-13 DIAGNOSIS — Z9221 Personal history of antineoplastic chemotherapy: Secondary | ICD-10-CM | POA: Diagnosis not present

## 2015-09-13 DIAGNOSIS — R0789 Other chest pain: Secondary | ICD-10-CM | POA: Diagnosis not present

## 2015-09-13 DIAGNOSIS — C9000 Multiple myeloma not having achieved remission: Secondary | ICD-10-CM

## 2015-09-13 DIAGNOSIS — Z5111 Encounter for antineoplastic chemotherapy: Secondary | ICD-10-CM

## 2015-09-13 DIAGNOSIS — R911 Solitary pulmonary nodule: Secondary | ICD-10-CM

## 2015-09-13 DIAGNOSIS — R918 Other nonspecific abnormal finding of lung field: Secondary | ICD-10-CM | POA: Diagnosis not present

## 2015-09-13 HISTORY — DX: Solitary pulmonary nodule: R91.1

## 2015-09-13 NOTE — Progress Notes (Signed)
[place] [phone]   [phone]  OFFICE PROGRESS NOTE  [name], MD [address] [place] [address]  PRINCIPAL DIAGNOSES:  1. Recurrent multiple myeloma initially diagnosed in 2002, initially with smoldering myeloma at [facility].  2. Ductal carcinoma in situ status post mastectomy with sentinel lymph node biopsy in October 2008.  PRIOR THERAPY:  1. Status post treatment with tamoxifen from November 2008 through February 2009, discontinued secondary to intolerance. 2. Status post 3 cycles of chemotherapy with Revlimid and Decadron followed by 1 cycle of Decadron only with mild response. 3. Status post 2 cycles of systemic chemotherapy with Velcade, Doxil and Decadron discontinued secondary to significant peripheral neuropathy. Last dose was given May 2010 at [facility]. 4. Status post autologous peripheral blood stem cell transplant on October 01, 2008 at [facility] under the care of Dr. Phyllis Ginger.  5. Systemic chemotherapy with Carfilzomib initially was 20 mg/M2 and will be increased after cycle #1 to 36 mg/M2 on days 1, 2, 8, 9, 15 and 16 every 4 weeks in addition to Cytoxan 300 mg/M2 and Decadron 40 mg by mouth weekly basis, status post 4 cycles. First cycle on 12/29/2012. 6. She resumed chemotherapy with Carfilzomib, Cytoxan, and Decadron on 05/03/2014. Status post 6 cycles.  CURRENT THERAPY: The patient resumed her systemic chemotherapy again with Carfilzomib, Cytoxan and Decadron on 07/11/2015. Status post 2 cycle.  INTERVAL HISTORY: Nancy Norris 75 y.o. female returns to the clinic today for follow-up visit. She continues to complain of fatigue and shortness breath with exertion. She also has intermittent left-sided chest wall pain. She tolerated the last cycle of her systemic chemotherapy fairly well except for pancytopenia. She missed day 15 and 16 of the last cycle  secondary to thrombocytopenia. She denied having any fever or chills today. She has cough or hemoptysis. She denied having any nausea or vomiting. No significant weight loss or night sweats. She is here today for evaluation before starting cycle #3.  MEDICAL HISTORY: Past Medical History:  Diagnosis Date  . Anemia   . Anginal pain ([place])    used NTG x 2 May 31 and 06/15/13   . Anxiety   . Arthritis   . B12 deficiency 12/04/2014  . Breast cancer ([place])   . CKD (chronic kidney disease) stage 3, GFR 30-59 ml/min   . Complication of anesthesia   . COPD (chronic obstructive pulmonary disease) ([place])   . Depression   . Dizziness   . Fibromyalgia   . Fibromyalgia   . GERD (gastroesophageal reflux disease)   . Headache(784.0)   . History of blood transfusion    last one May 12   . Hx of cardiovascular stress test    LexiScan with low level exercise Myoview (02/2013): No ischemia, EF 72%; normal study  . Hx of echocardiogram    a.  Echocardiogram (12/26/2012): EF 14-48%, grade 1 diastolic dysfunction;   b.  Echocardiogram (02/2013): EF 55-60%, no WMA, trivial effusion  . Hyperkalemia   . Hyponatremia   . Hypothyroidism   . Mucositis   . Multiple myeloma   . Myocardial infarction [facility])    in past, patient was unaware.   . Neuropathy ([place])   . Pain in joint, pelvic region and thigh 07/07/2015  . PONV (postoperative nausea and vomiting) 2008   after mastestomy    ALLERGIES:  is allergic to codeine; latex; other; onion; zyprexa [olanzapine]; adhesive [tape]; hydrocodone;  iodinated diagnostic agents; and sulfa antibiotics.  MEDICATIONS:  Current Outpatient Prescriptions  Medication Sig Dispense Refill  . acetaminophen (TYLENOL) 325 MG tablet Take 650 mg by mouth every 6 (six) hours as needed for mild pain or moderate pain.     Marland Kitchen albuterol (PROVENTIL HFA;VENTOLIN HFA) 108 (90 Base) MCG/ACT inhaler Inhale 1-2 puffs into the lungs every 6 (six) hours as needed for wheezing or shortness of breath  (cough). 1 Inhaler 3  . ANORO ELLIPTA 62.5-25 MCG/INH AEPB   0  . ascorbic acid (VITAMIN C) 250 MG CHEW Chew 250 mg by mouth daily.    Marland Kitchen azelastine (ASTELIN) 0.1 % nasal spray Place 2 sprays into both nostrils 2 (two) times daily. Use in each nostril as directed 30 mL 2  . buPROPion (WELLBUTRIN XL) 300 MG 24 hr tablet Take 1 tablet (300 mg total) by mouth daily. 90 tablet 1  . carboxymethylcellulose (REFRESH PLUS) 0.5 % SOLN Place 1 drop into both eyes every 2 (two) hours.    . cetirizine (ZYRTEC) 10 MG tablet Take 10 mg by mouth.    . Cholecalciferol 4000 UNITS TABS Take 4,000 Units by mouth daily.    . ciprofloxacin (CIPRO) 250 MG tablet Take 1 tablet (250 mg total) by mouth 2 (two) times daily. 10 tablet 0  . citalopram (CELEXA) 40 MG tablet Take 1 tablet daily or as directed 90 tablet 1  . cromolyn (OPTICROM) 4 % ophthalmic solution Place 1 drop into both eyes 4 (four) times daily.    . ferrous sulfate 325 (65 FE) MG tablet Take 1 tablet daily or as directed 90 tablet 1  . fluticasone furoate-vilanterol (BREO ELLIPTA) 100-25 MCG/INH AEPB Inhale 1 puff into the lungs daily. 28 each 0  . gabapentin (NEURONTIN) 300 MG capsule TAKE 1 CAPSULE BY MOUTH AT BEDTIME. 90 capsule 1  . isosorbide mononitrate (IMDUR) 30 MG 24 hr tablet Take 1 tablet (30 mg total) by mouth daily. 90 tablet 3  . levofloxacin (LEVAQUIN) 250 MG tablet Take 2 tabs (500 mg) PO on day #1; then take 1 tab (250 mg) PO every other day till gone. (Renal dose). 7 tablet 0  . levothyroxine (SYNTHROID, LEVOTHROID) 150 MCG tablet Take 1 tablet (150 mcg total) by mouth daily. 90 tablet 1  . lidocaine (LIDODERM) 5 % Place 1 patch onto the skin daily. Remove & Discard patch within 12 hours or as directed by MD 30 patch 0  . loperamide (IMODIUM) 2 MG capsule Take two tabs po initially, then one tab after each loose stool: max 8 tabs in 24 hours 30 capsule 0  . loratadine (CLARITIN) 10 MG tablet Take 10 mg by mouth daily.    Marland Kitchen LORazepam  (ATIVAN) 0.5 MG tablet Take 1 tablet (0.5 mg total) by mouth every 8 (eight) hours as needed (nausea). 30 tablet 0  . loteprednol (ALREX) 0.2 % SUSP Place 1 drop into both eyes 4 (four) times daily. 1 Bottle PRN  . meloxicam (MOBIC) 15 MG tablet Take 1 tablet (15 mg total) by mouth daily. 90 tablet 3  . midodrine (PROAMATINE) 10 MG tablet TAKE 1 TABLET BY MOUTH 3 TIMES DAILY. 270 tablet 3  . nitroGLYCERIN (NITROSTAT) 0.4 MG SL tablet Place 0.4 mg under the tongue every 5 (five) minutes as needed for chest pain. Reported on 03/30/2015    . omeprazole (PRILOSEC) 40 MG capsule Take 40 mg by mouth.    . ondansetron (ZOFRAN-ODT) 8 MG disintegrating tablet Take 1 tablet (8 mg  total) by mouth every 8 (eight) hours as needed for nausea or vomiting. 90 tablet 0  . pantoprazole (PROTONIX) 40 MG tablet Take 1 tablet daily for Acid reflux & indigestion 90 tablet 1  . pantoprazole (PROTONIX) 40 MG tablet TAKE 1 TABLET DAILY FOR ACID REFLUX & INDIGESTION 90 tablet 1  . pilocarpine (SALAGEN) 5 MG tablet Take 1 tablet 3 x / day for dry mouth. 270 tablet 1  . promethazine (PHENERGAN) 25 MG tablet Take 25 mg by mouth.    . ranitidine (ZANTAC) 300 MG tablet Take 1 to 2 tablets daily for heartburn & reflux to allow wean and transition from PPI / pantoprazole 180 tablet 3  . senna (SENOKOT) 8.6 MG tablet Take 1 tablet by mouth at bedtime.    . traMADol (ULTRAM) 50 MG tablet Take 1 to 2 tablets by mouth 4 times a day as needed for pain. 180 tablet 0   No current facility-administered medications for this visit.    Facility-Administered Medications Ordered in Other Visits  Medication Dose Route Frequency Provider Last Rate Last Dose  . 0.9 %  sodium chloride infusion   Intravenous Once Adrena E Johnson, PA-C      . acetaminophen (TYLENOL) tablet 650 mg  650 mg Oral Once Tillman Abide, NP      . sodium chloride 0.9 % injection 10 mL  10 mL Intracatheter PRN Si Gaul, MD   10 mL at 01/05/13 1825  . sodium  chloride 0.9 % injection 10 mL  10 mL Intracatheter PRN Si Gaul, MD   10 mL at 06/15/14 1514    SURGICAL HISTORY:  Past Surgical History:  Procedure Laterality Date  . ABDOMINAL HYSTERECTOMY  1981  . AV FISTULA PLACEMENT Left 06/19/2013   Procedure: CREATION OF LEFT ARM ARTERIOVENOUS (AV) FISTULA ;  Surgeon: Chuck Hint, MD;  Location: [facility] OR;  Service: Vascular;  Laterality: Left;  . BREAST RECONSTRUCTION    . BREAST SURGERY    . CATARACT EXTRACTION, BILATERAL    . CHOLECYSTECTOMY  1971  . EYE SURGERY Bilateral    lens implant  . history of Port removal    . MASTECTOMY Left 2008  . PORTACATH PLACEMENT  12/2012   has had 2  . Status post stem cell transplant on September 28, 2008.      REVIEW OF SYSTEMS:  A comprehensive review of systems was negative except for: Constitutional: positive for fatigue Respiratory: positive for dyspnea on exertion and pleurisy/chest pain Musculoskeletal: positive for muscle weakness   PHYSICAL EXAMINATION: General appearance: alert, cooperative and no distress Head: Normocephalic, without obvious abnormality, atraumatic Neck: no adenopathy, no JVD, supple, symmetrical, trachea midline and thyroid not enlarged, symmetric, no tenderness/mass/nodules Lymph nodes: Cervical, supraclavicular, and axillary nodes normal. Resp: clear to auscultation bilaterally Back: symmetric, no curvature. ROM normal. No CVA tenderness. Cardio: regular rate and rhythm, S1, S2 normal, no murmur, click, rub or gallop GI: soft, non-tender; bowel sounds normal; no masses,  no organomegaly Extremities: extremities normal, atraumatic, no cyanosis or edema Neurologic: Alert and oriented X 3, normal strength and tone. Normal symmetric reflexes. Normal coordination and gait  ECOG PERFORMANCE STATUS: 1 - Symptomatic but completely ambulatory  Blood pressure (!) 111/49, pulse 80, temperature 99.4 F (37.4 C), temperature source Oral, resp. rate 18, height 5\' 5"   (1.651 m), weight 160 lb (72.6 kg), SpO2 100 %.  LABORATORY DATA: Lab Results  Component Value Date   WBC 3.0 (L) 08/30/2015   HGB 8.8 (L)  08/30/2015   HCT 27.2 (L) 08/30/2015   MCV 103.8 (H) 08/30/2015   PLT 112 (L) 08/30/2015      Chemistry      Component Value Date/Time   NA 141 08/30/2015 1120   K 3.6 08/30/2015 1120   CL 113 (H) 05/27/2015 1139   CL 105 03/19/2012 0811   CO2 25 08/30/2015 1120   BUN 38.4 (H) 08/30/2015 1120   CREATININE 3.1 Repeated and Verified (HH) 08/30/2015 1120      Component Value Date/Time   CALCIUM 9.7 08/30/2015 1120   ALKPHOS 109 08/30/2015 1120   AST 21 08/30/2015 1120   ALT 20 08/30/2015 1120   BILITOT 1.34 (H) 08/30/2015 1120     RADIOGRAPHIC STUDIES: Dg Chest 2 View  Result Date: 08/23/2015 CLINICAL DATA:  History of breast cancer and multiple myeloma. Cough and congestion for 3 weeks. EXAM: CHEST  2 VIEW COMPARISON:  December 02, 2014 FINDINGS: There is a rounded nodular density projected over the left mid lung measuring 2.8 cm in a perihilar location. No other nodules, masses, or focal infiltrates. No pneumothorax. Stable Port-A-Cath. The heart, hila, and mediastinum are unchanged. No other acute abnormalities. IMPRESSION: 1. 2.8 cm nodular density projected over the left perihilar region on only the frontal view. This could represent a rounded pneumonia. However, given the history of cancer, a true pulmonary nodule/ neoplasm is not excluded. This is new since November of 2016. If the patient has symptoms suggesting pneumonia, recommend treatment with short-term follow-up chest x-ray in 2 or 3 weeks. If the patient does not have symptoms of pneumonia, a CT scan could better evaluate. Electronically Signed   By: Gerome Sam III M.D   On: 08/23/2015 12:13    ASSESSMENT AND PLAN: This is a very pleasant 75 years old white female with history of multiple myeloma as well as chronic kidney disease status post several chemotherapy regimen and  most recently treated with Carfilzomib, Cytoxan and Decadron status post 5 cycles. The patient has been tolerating her treatment well except for the fatigue secondary to end-stage renal disease as well as anemia of chronic disease. The patient is currently on observation and feeling fine. Unfortunately the recent myeloma panel showed evidence for disease progression with increase in the free kappa light chain and worsening of her renal function as well as anemia. I discussed the lab result with the patient and her daughter. I recommended for her to resume her systemic chemotherapy with Carfilzomib, Cytoxan and Decadron. She started the first dose of the treatment on 07/11/2015. She tolerated the first 2 cycles well with no specific complaints except for fatigue and pancytopenia. She is expected to start cycle #3 on 09/20/2015. I will see her back for follow-up visit in 4 weeks with repeat myeloma panel for reevaluation of her disease. For the shortness breath and left-sided chest wall pain, I will order a chest x-ray today to rule out any abnormalities. For the chronic kidney disease, the patient will continue her routine follow-up visit with her nephrologist. The patient was advised to call immediately if she has any concerning symptoms in the interval. The patient voices understanding of current disease status and treatment options and is in agreement with the current care plan.  All questions were answered. The patient knows to call the clinic with any problems, questions or concerns. We can certainly see the patient much sooner if necessary.  Disclaimer: This note was dictated with voice recognition software. Similar sounding words can inadvertently be transcribed  and may not be corrected upon review.

## 2015-09-14 ENCOUNTER — Telehealth: Payer: Self-pay | Admitting: Internal Medicine

## 2015-09-14 ENCOUNTER — Telehealth: Payer: Self-pay | Admitting: *Deleted

## 2015-09-14 NOTE — Telephone Encounter (Signed)
Spoke with patient re next appointment for 9/6. Patient will get new schedule 9/6. Central radiology will call re scan

## 2015-09-14 NOTE — Telephone Encounter (Signed)
Per LOS I have scheduled appats

## 2015-09-19 ENCOUNTER — Encounter: Payer: Self-pay | Admitting: *Deleted

## 2015-09-20 ENCOUNTER — Other Ambulatory Visit: Payer: Self-pay | Admitting: Medical Oncology

## 2015-09-20 DIAGNOSIS — C9000 Multiple myeloma not having achieved remission: Secondary | ICD-10-CM

## 2015-09-21 ENCOUNTER — Other Ambulatory Visit: Payer: Self-pay | Admitting: Medical Oncology

## 2015-09-21 ENCOUNTER — Telehealth: Payer: Self-pay | Admitting: Medical Oncology

## 2015-09-21 ENCOUNTER — Ambulatory Visit: Payer: Medicare Other

## 2015-09-21 ENCOUNTER — Other Ambulatory Visit: Payer: Medicare Other

## 2015-09-21 DIAGNOSIS — M47816 Spondylosis without myelopathy or radiculopathy, lumbar region: Secondary | ICD-10-CM | POA: Diagnosis not present

## 2015-09-21 DIAGNOSIS — M545 Low back pain: Secondary | ICD-10-CM | POA: Diagnosis not present

## 2015-09-21 NOTE — Telephone Encounter (Signed)
Nancy Norris talked to pt about her concerns and CT pending.Pt cancelled tx today.

## 2015-09-22 ENCOUNTER — Ambulatory Visit: Payer: Medicare Other

## 2015-09-26 ENCOUNTER — Ambulatory Visit (HOSPITAL_COMMUNITY)
Admission: RE | Admit: 2015-09-26 | Discharge: 2015-09-26 | Disposition: A | Discharge status: Home / Self Care | Payer: Medicare Other | Source: Ambulatory Visit | Attending: Internal Medicine | Admitting: Internal Medicine

## 2015-09-26 DIAGNOSIS — J439 Emphysema, unspecified: Secondary | ICD-10-CM | POA: Insufficient documentation

## 2015-09-26 DIAGNOSIS — R911 Solitary pulmonary nodule: Secondary | ICD-10-CM | POA: Diagnosis not present

## 2015-09-26 DIAGNOSIS — I700 Atherosclerosis of aorta: Secondary | ICD-10-CM | POA: Insufficient documentation

## 2015-09-26 DIAGNOSIS — R918 Other nonspecific abnormal finding of lung field: Secondary | ICD-10-CM | POA: Diagnosis not present

## 2015-09-28 ENCOUNTER — Ambulatory Visit (HOSPITAL_COMMUNITY)
Admission: RE | Admit: 2015-09-28 | Discharge: 2015-09-28 | Disposition: A | Discharge status: Home / Self Care | Payer: Medicare Other | Source: Ambulatory Visit | Attending: Internal Medicine | Admitting: Internal Medicine

## 2015-09-28 ENCOUNTER — Other Ambulatory Visit (HOSPITAL_BASED_OUTPATIENT_CLINIC_OR_DEPARTMENT_OTHER): Payer: Medicare Other

## 2015-09-28 ENCOUNTER — Other Ambulatory Visit: Payer: Self-pay | Admitting: Medical Oncology

## 2015-09-28 ENCOUNTER — Ambulatory Visit (HOSPITAL_BASED_OUTPATIENT_CLINIC_OR_DEPARTMENT_OTHER): Payer: Medicare Other

## 2015-09-28 ENCOUNTER — Other Ambulatory Visit: Payer: Self-pay | Admitting: Internal Medicine

## 2015-09-28 ENCOUNTER — Ambulatory Visit (HOSPITAL_BASED_OUTPATIENT_CLINIC_OR_DEPARTMENT_OTHER): Payer: Medicare Other | Admitting: Nurse Practitioner

## 2015-09-28 VITALS — BP 108/46 | HR 64 | Temp 98.9°F | Resp 18

## 2015-09-28 DIAGNOSIS — D649 Anemia, unspecified: Secondary | ICD-10-CM

## 2015-09-28 DIAGNOSIS — C9000 Multiple myeloma not having achieved remission: Secondary | ICD-10-CM

## 2015-09-28 DIAGNOSIS — T8092XA Unspecified transfusion reaction, initial encounter: Secondary | ICD-10-CM

## 2015-09-28 DIAGNOSIS — Z5112 Encounter for antineoplastic immunotherapy: Secondary | ICD-10-CM

## 2015-09-28 DIAGNOSIS — Z5111 Encounter for antineoplastic chemotherapy: Secondary | ICD-10-CM

## 2015-09-28 DIAGNOSIS — R0602 Shortness of breath: Secondary | ICD-10-CM

## 2015-09-28 DIAGNOSIS — R918 Other nonspecific abnormal finding of lung field: Secondary | ICD-10-CM | POA: Insufficient documentation

## 2015-09-28 LAB — COMPREHENSIVE METABOLIC PANEL
ALT: <9 U/L (ref 0–55)
AST: 11 U/L (ref 5–34)
Albumin: 3.4 g/dL — ABNORMAL LOW (ref 3.5–5.0)
Alkaline Phosphatase: 95 U/L (ref 40–150)
Anion Gap: 11 mEq/L (ref 3–11)
BUN: 43.4 mg/dL — AB (ref 7.0–26.0)
CHLORIDE: 108 meq/L (ref 98–109)
CO2: 21 mEq/L — ABNORMAL LOW (ref 22–29)
Calcium: 9.4 mg/dL (ref 8.4–10.4)
Creatinine: 3.1 mg/dL (ref 0.6–1.1)
EGFR: 14 mL/min/{1.73_m2} — ABNORMAL LOW (ref >90)
GLUCOSE: 94 mg/dL (ref 70–140)
POTASSIUM: 3.9 meq/L (ref 3.5–5.1)
SODIUM: 140 meq/L (ref 136–145)
Total Bilirubin: 0.67 mg/dL (ref 0.20–1.20)
Total Protein: 6.7 g/dL (ref 6.4–8.3)

## 2015-09-28 LAB — URINALYSIS W MICROSCOPIC (NOT AT ARMC)
Bacteria, UA: NONE SEEN
Bilirubin Urine: NEGATIVE
GLUCOSE, UA: 100 mg/dL — AB
HGB URINE DIPSTICK: NEGATIVE
KETONES UR: NEGATIVE mg/dL
LEUKOCYTES UA: NEGATIVE
Nitrite: NEGATIVE
PH: 6 (ref 5.0–8.0)
Protein, ur: 30 mg/dL — AB
RBC / HPF: NONE SEEN RBC/hpf (ref 0–5)
Specific Gravity, Urine: 1.015 (ref 1.005–1.030)
WBC, UA: NONE SEEN WBC/hpf (ref 0–5)

## 2015-09-28 LAB — CBC WITH DIFFERENTIAL/PLATELET
BASO%: 0.8 % (ref 0.0–2.0)
Basophils Absolute: 0.1 10*3/uL (ref 0.0–0.1)
EOS ABS: 0.1 10*3/uL (ref 0.0–0.5)
EOS%: 0.9 % (ref 0.0–7.0)
HCT: 23.6 % — ABNORMAL LOW (ref 34.8–46.6)
HGB: 7.6 g/dL — ABNORMAL LOW (ref 11.6–15.9)
LYMPH%: 13.4 % — AB (ref 14.0–49.7)
MCH: 37.2 pg — ABNORMAL HIGH (ref 25.1–34.0)
MCHC: 32 g/dL (ref 31.5–36.0)
MCV: 116 fL — AB (ref 79.5–101.0)
MONO#: 1.3 10*3/uL — AB (ref 0.1–0.9)
MONO%: 13.8 % (ref 0.0–14.0)
NEUT%: 71.1 % (ref 38.4–76.8)
NEUTROS ABS: 6.8 10*3/uL — AB (ref 1.5–6.5)
PLATELETS: 150 10*3/uL (ref 145–400)
RBC: 2.03 10*6/uL — AB (ref 3.70–5.45)
RDW: 16.8 % — ABNORMAL HIGH (ref 11.2–14.5)
WBC: 9.6 10*3/uL (ref 3.9–10.3)
lymph#: 1.3 10*3/uL (ref 0.9–3.3)

## 2015-09-28 LAB — PREPARE RBC (CROSSMATCH)

## 2015-09-28 LAB — LACTATE DEHYDROGENASE: LDH: 152 U/L (ref 125–245)

## 2015-09-28 MED ORDER — DIPHENHYDRAMINE HCL 50 MG/ML IJ SOLN
25.0000 mg | Freq: Once | INTRAMUSCULAR | Status: AC
  Administered 2015-09-28: 25 mg via INTRAVENOUS

## 2015-09-28 MED ORDER — SODIUM CHLORIDE 0.9 % IV SOLN
Freq: Once | INTRAVENOUS | Status: AC
  Administered 2015-09-28: 15:00:00 via INTRAVENOUS
  Filled 2015-09-28: qty 4

## 2015-09-28 MED ORDER — DEXTROSE 5 % IV SOLN
27.0000 mg/m2 | Freq: Once | INTRAVENOUS | Status: AC
  Administered 2015-09-28: 50 mg via INTRAVENOUS
  Filled 2015-09-28: qty 25

## 2015-09-28 MED ORDER — SODIUM CHLORIDE 0.9 % IV SOLN
225.0000 mg/m2 | Freq: Once | INTRAVENOUS | Status: AC
  Administered 2015-09-28: 420 mg via INTRAVENOUS
  Filled 2015-09-28: qty 21

## 2015-09-28 MED ORDER — SODIUM CHLORIDE 0.9 % IV SOLN
250.0000 mL | Freq: Once | INTRAVENOUS | Status: AC
  Administered 2015-09-28: 250 mL via INTRAVENOUS

## 2015-09-28 MED ORDER — FAMOTIDINE IN NACL 20-0.9 MG/50ML-% IV SOLN
20.0000 mg | Freq: Two times a day (BID) | INTRAVENOUS | Status: DC
  Administered 2015-09-28: 20 mg via INTRAVENOUS

## 2015-09-28 MED ORDER — SODIUM CHLORIDE 0.9 % IV SOLN
Freq: Once | INTRAVENOUS | Status: AC
  Administered 2015-09-28: 14:00:00 via INTRAVENOUS

## 2015-09-28 MED ORDER — ACETAMINOPHEN 325 MG PO TABS
650.0000 mg | ORAL_TABLET | Freq: Once | ORAL | Status: AC
  Administered 2015-09-28: 650 mg via ORAL

## 2015-09-28 MED ORDER — DIPHENHYDRAMINE HCL 25 MG PO CAPS
ORAL_CAPSULE | ORAL | Status: AC
  Filled 2015-09-28: qty 1

## 2015-09-28 MED ORDER — SODIUM CHLORIDE 0.9% FLUSH
10.0000 mL | INTRAVENOUS | Status: AC | PRN
  Administered 2015-09-28: 10 mL
  Filled 2015-09-28: qty 10

## 2015-09-28 MED ORDER — HEPARIN SOD (PORK) LOCK FLUSH 100 UNIT/ML IV SOLN
500.0000 [IU] | Freq: Every day | INTRAVENOUS | Status: DC | PRN
  Filled 2015-09-28: qty 5

## 2015-09-28 MED ORDER — HEPARIN SOD (PORK) LOCK FLUSH 100 UNIT/ML IV SOLN
500.0000 [IU] | Freq: Once | INTRAVENOUS | Status: AC | PRN
  Administered 2015-09-28: 500 [IU]
  Filled 2015-09-28: qty 5

## 2015-09-28 MED ORDER — ACETAMINOPHEN 325 MG PO TABS
ORAL_TABLET | ORAL | Status: AC
  Filled 2015-09-28: qty 2

## 2015-09-28 MED ORDER — DIPHENHYDRAMINE HCL 25 MG PO CAPS
25.0000 mg | ORAL_CAPSULE | Freq: Once | ORAL | Status: AC
  Administered 2015-09-28: 25 mg via ORAL

## 2015-09-28 MED ORDER — SODIUM CHLORIDE 0.9 % IV SOLN
Freq: Once | INTRAVENOUS | Status: AC
  Administered 2015-09-28: 15:00:00 via INTRAVENOUS

## 2015-09-28 MED ORDER — SODIUM CHLORIDE 0.9 % IJ SOLN
10.0000 mL | INTRAMUSCULAR | Status: DC | PRN
  Filled 2015-09-28: qty 10

## 2015-09-28 NOTE — Patient Instructions (Signed)
[facility] Discharge Instructions for Patients Receiving Chemotherapy  Today you received the following chemotherapy agents, Cytoxan and Kyprolis   To help prevent nausea and vomiting after your treatment, we encourage you to take your nausea medication as directed.    If you develop nausea and vomiting that is not controlled by your nausea medication, call the clinic.   BELOW ARE SYMPTOMS THAT SHOULD BE REPORTED IMMEDIATELY:  *FEVER GREATER THAN 100.5 F  *CHILLS WITH OR WITHOUT FEVER  NAUSEA AND VOMITING THAT IS NOT CONTROLLED WITH YOUR NAUSEA MEDICATION  *UNUSUAL SHORTNESS OF BREATH  *UNUSUAL BRUISING OR BLEEDING  TENDERNESS IN MOUTH AND THROAT WITH OR WITHOUT PRESENCE OF ULCERS  *URINARY PROBLEMS  *BOWEL PROBLEMS  UNUSUAL RASH Items with * indicate a potential emergency and should be followed up as soon as possible.  Feel free to call the clinic you have any questions or concerns. The clinic phone number is [phone].  Please show the [facility] at check-in to the Emergency Department and triage nurse.

## 2015-09-28 NOTE — Progress Notes (Signed)
Per Dr. Julien Nordmann okay to proceed with treatment with 09/28/15 labs (Creatinine 3.1 and Hemoglobin 7.6) pt to receive 2 units of blood this week. Plan is to transfuse one unit today and one 09/29/15, pt aware and verbalizes understanding. Pt reports pain in left upper chest rating it a 2 out of 10 and describing it as pressure stating that it is constant and that she takes tylenol at home, MD aware and will come to infusion to speak with pt. No further orders at this time.

## 2015-09-28 NOTE — Progress Notes (Signed)
At 1658 blood transfusion stopped d/t patient complaints of feeling suddently hot and flushed. IV NS started immediately. Pt. Stated that she had never had this type of reaction before with previous blood transfusion. Notified Granville Lewis, NP, who evaluated patient, gave orders for Benadryl 25 mg IV and Pepsid 20 mg IV. Medications given as ordered, vital signs taken and documented. Pt. Resting comfortably at this time, states she feels fine now. Granville Lewis, NP, returned to reassess patient at this time. Pt. Escorted to lobby via wheelchair at 1755 and taken home by family member.

## 2015-09-29 ENCOUNTER — Encounter: Payer: Self-pay | Admitting: Nurse Practitioner

## 2015-09-29 ENCOUNTER — Ambulatory Visit (HOSPITAL_BASED_OUTPATIENT_CLINIC_OR_DEPARTMENT_OTHER): Payer: Medicare Other

## 2015-09-29 VITALS — BP 113/56 | HR 67 | Temp 98.6°F | Resp 18

## 2015-09-29 DIAGNOSIS — D649 Anemia, unspecified: Secondary | ICD-10-CM | POA: Diagnosis not present

## 2015-09-29 DIAGNOSIS — Z23 Encounter for immunization: Secondary | ICD-10-CM | POA: Diagnosis not present

## 2015-09-29 DIAGNOSIS — T8092XA Unspecified transfusion reaction, initial encounter: Secondary | ICD-10-CM | POA: Insufficient documentation

## 2015-09-29 DIAGNOSIS — M797 Fibromyalgia: Secondary | ICD-10-CM

## 2015-09-29 DIAGNOSIS — R530 Neoplastic (malignant) related fatigue: Secondary | ICD-10-CM

## 2015-09-29 DIAGNOSIS — C9000 Multiple myeloma not having achieved remission: Secondary | ICD-10-CM | POA: Diagnosis not present

## 2015-09-29 DIAGNOSIS — Z5112 Encounter for antineoplastic immunotherapy: Secondary | ICD-10-CM

## 2015-09-29 LAB — IGG, IGA, IGM
IgA, Qn, Serum: 13 mg/dL — ABNORMAL LOW (ref 64–422)
IgG, Qn, Serum: 391 mg/dL — ABNORMAL LOW (ref 700–1600)
IgM, Qn, Serum: 6 mg/dL — ABNORMAL LOW (ref 26–217)

## 2015-09-29 LAB — KAPPA/LAMBDA LIGHT CHAINS
IG LAMBDA FREE LIGHT CHAIN: 6.4 mg/L (ref 5.7–26.3)
Ig Kappa Free Light Chain: 24.7 mg/L — ABNORMAL HIGH (ref 3.3–19.4)
KAPPA/LAMBDA FLC RATIO: 3.86 — AB (ref 0.26–1.65)

## 2015-09-29 LAB — TRANSFUSION REACTION
DAT C3: NEGATIVE
Post RXN DAT IgG: NEGATIVE

## 2015-09-29 LAB — BETA 2 MICROGLOBULIN, SERUM: Beta-2: 7.3 mg/L — ABNORMAL HIGH (ref 0.6–2.4)

## 2015-09-29 MED ORDER — DEXAMETHASONE SODIUM PHOSPHATE 100 MG/10ML IJ SOLN
Freq: Once | INTRAMUSCULAR | Status: AC
  Administered 2015-09-29: 11:00:00 via INTRAVENOUS
  Filled 2015-09-29: qty 4

## 2015-09-29 MED ORDER — HEPARIN SOD (PORK) LOCK FLUSH 100 UNIT/ML IV SOLN
500.0000 [IU] | Freq: Once | INTRAVENOUS | Status: AC | PRN
  Administered 2015-09-29: 500 [IU]
  Filled 2015-09-29: qty 5

## 2015-09-29 MED ORDER — ACETAMINOPHEN 325 MG PO TABS
650.0000 mg | ORAL_TABLET | Freq: Once | ORAL | Status: AC
  Administered 2015-09-29: 650 mg via ORAL

## 2015-09-29 MED ORDER — DIPHENHYDRAMINE HCL 25 MG PO CAPS
ORAL_CAPSULE | ORAL | Status: AC
  Filled 2015-09-29: qty 1

## 2015-09-29 MED ORDER — SODIUM CHLORIDE 0.9 % IV SOLN
Freq: Once | INTRAVENOUS | Status: AC
  Administered 2015-09-29: 12:00:00 via INTRAVENOUS

## 2015-09-29 MED ORDER — ACETAMINOPHEN 325 MG PO TABS
ORAL_TABLET | ORAL | Status: AC
  Filled 2015-09-29: qty 2

## 2015-09-29 MED ORDER — DEXTROSE 5 % IV SOLN
27.0000 mg/m2 | Freq: Once | INTRAVENOUS | Status: AC
  Administered 2015-09-29: 50 mg via INTRAVENOUS
  Filled 2015-09-29: qty 25

## 2015-09-29 MED ORDER — INFLUENZA VAC SPLIT QUAD 0.5 ML IM SUSY
0.5000 mL | PREFILLED_SYRINGE | Freq: Once | INTRAMUSCULAR | Status: AC
  Administered 2015-09-29: 0.5 mL via INTRAMUSCULAR

## 2015-09-29 MED ORDER — SODIUM CHLORIDE 0.9 % IV SOLN
Freq: Once | INTRAVENOUS | Status: AC
  Administered 2015-09-29: 11:00:00 via INTRAVENOUS

## 2015-09-29 MED ORDER — SODIUM CHLORIDE 0.9 % IJ SOLN
10.0000 mL | INTRAMUSCULAR | Status: DC | PRN
  Administered 2015-09-29: 10 mL
  Filled 2015-09-29: qty 10

## 2015-09-29 MED ORDER — DIPHENHYDRAMINE HCL 25 MG PO CAPS
25.0000 mg | ORAL_CAPSULE | Freq: Once | ORAL | Status: AC
  Administered 2015-09-29: 25 mg via ORAL

## 2015-09-29 NOTE — Assessment & Plan Note (Signed)
Patient received cycle 10 of her Cytoxan/Kyprolis chemotherapy regimen on 09/28/2015.  She presented to the [facility] today to receive a blood transfusion.  See further notes for details of today's visit.  Patient is scheduled to return tomorrow to receive additional blood transfusion.  She is scheduled to return on 10/05/2015 for her next labs and chemotherapy.

## 2015-09-29 NOTE — Progress Notes (Signed)
Pt reported that within 30 minutes of leaving clinic on 09/28/15 her throat felt swollen and hard to swallow but did resolve itself soon after. Dr. Julien Nordmann aware and per Dr. Julien Nordmann pt will receive treatment and will proceed with tylenol 650 mg, Benadryl PO 25 mg and 2 units of blood. Pt aware and verbalizes understanding.   Pt tolerated infusions well. No s/s of distress noted. Pt and VS stable at time of discharge.

## 2015-09-29 NOTE — Assessment & Plan Note (Signed)
Patient's hemoglobin is 7.6.  She presented today to the [facility] for her first unit of packed red blood cells transfusion.  She was in the midst of the transfusion; when she developed some flushing and feeling hot all over.  She denied any other new symptoms whatsoever.  She denied any chest pain or shortness of breath.  Her vital signs remained stable.  Blood transfusion was immediately held; and patient was given Benadryl and Pepcid per protocol..  Blood transfusion reaction protocol was initiated and all labs / urine drawn as directed.   All symptoms completely resolved; and patient was actually her baseline.  She was given instructions regarding both Benadryl and Pepcid overnight.  If needed.  She was also instructed to go directly to the emergency department for any worsening symptoms whatsoever.  Patient has been rescheduled to receive the 2 units of packed red blood cell transfusion tomorrow 09/29/2015.  [facility].  She will be premedicated prior to the blood transfusion tomorrow.

## 2015-09-29 NOTE — Patient Instructions (Signed)
[facility] Discharge Instructions for Patients Receiving Chemotherapy  Today you received the following chemotherapy agents: Kyprolis   To help prevent nausea and vomiting after your treatment, we encourage you to take your nausea medication as directed.    If you develop nausea and vomiting that is not controlled by your nausea medication, call the clinic.   BELOW ARE SYMPTOMS THAT SHOULD BE REPORTED IMMEDIATELY:  *FEVER GREATER THAN 100.5 F  *CHILLS WITH OR WITHOUT FEVER  NAUSEA AND VOMITING THAT IS NOT CONTROLLED WITH YOUR NAUSEA MEDICATION  *UNUSUAL SHORTNESS OF BREATH  *UNUSUAL BRUISING OR BLEEDING  TENDERNESS IN MOUTH AND THROAT WITH OR WITHOUT PRESENCE OF ULCERS  *URINARY PROBLEMS  *BOWEL PROBLEMS  UNUSUAL RASH Items with * indicate a potential emergency and should be followed up as soon as possible.  Feel free to call the clinic you have any questions or concerns. The clinic phone number is [phone].  Please show the CHEMO ALERT CARD at check-in to the Emergency Department and triage nurse.   

## 2015-09-29 NOTE — Progress Notes (Signed)
 SYMPTOM MANAGEMENT CLINIC    Chief Complaint: Blood transfusion reaction  HPI:  Nancy Norris 75 y.o. female diagnosed with multiple myeloma.  Currently undergoing Cytoxan/chemotherapy regimen.kyprolis     No history exists.    Review of Systems  Skin:       Patient states that she felt flushed and hot all over during reaction; but all symptoms resolved with protocol treatment.  All other systems reviewed and are negative.   Past Medical History:  Diagnosis Date  . Anemia   . Anginal pain (HCC)    used NTG x 2 May 31 and 06/15/13   . Anxiety   . Arthritis   . B12 deficiency 12/04/2014  . Breast cancer (HCC)   . CKD (chronic kidney disease) stage 3, GFR 30-59 ml/min   . Complication of anesthesia   . COPD (chronic obstructive pulmonary disease) (HCC)   . Depression   . Dizziness   . Fibromyalgia   . Fibromyalgia   . GERD (gastroesophageal reflux disease)   . Headache(784.0)   . History of blood transfusion    last one May 12   . Hx of cardiovascular stress test    LexiScan with low level exercise Myoview (02/2013): No ischemia, EF 72%; normal study  . Hx of echocardiogram    a.  Echocardiogram (12/26/2012): EF 60-65%, grade 1 diastolic dysfunction;   b.  Echocardiogram (02/2013): EF 55-60%, no WMA, trivial effusion  . Hyperkalemia   . Hyponatremia   . Hypothyroidism   . Mucositis   . Multiple myeloma   . Myocardial infarction (HCC)    in past, patient was unaware.   . Neuropathy (HCC)   . Pain in joint, pelvic region and thigh 07/07/2015  . PONV (postoperative nausea and vomiting) 2008   after mastestomy    Past Surgical History:  Procedure Laterality Date  . ABDOMINAL HYSTERECTOMY  1981  . AV FISTULA PLACEMENT Left 06/19/2013   Procedure: CREATION OF LEFT ARM ARTERIOVENOUS (AV) FISTULA ;  Surgeon: Christopher S Dickson, MD;  Location: [facility] OR;  Service: Vascular;  Laterality: Left;  . BREAST RECONSTRUCTION    . BREAST SURGERY    . CATARACT EXTRACTION,  BILATERAL    . CHOLECYSTECTOMY  1971  . EYE SURGERY Bilateral    lens implant  . history of Port removal    . MASTECTOMY Left 2008  . PORTACATH PLACEMENT  12/2012   has had 2  . Status post stem cell transplant on September 28, 2008.      has Hypercalcemia; HX: breast cancer; Asthma, chronic; Hypothyroid; Chronic diastolic heart failure (HCC); HTN (hypertension); GERD (gastroesophageal reflux disease); COPD (chronic obstructive pulmonary disease) with emphysema (HCC); Fibromyalgia; Mixed hyperlipidemia; Prediabetes; Vitamin D deficiency; Medication management; Depression, controlled; Multiple myeloma (HCC); Orthostatic hypotension; CKD (chronic kidney disease) stage 5, GFR less than 15 ml/min (HCC); Encounter for antineoplastic chemotherapy; CHF (congestive heart failure) (HCC); Malnutrition of moderate degree; B12 deficiency; Thrombocytopenia (HCC); SOB (shortness of breath); Neoplastic malignant related fatigue; Anemia in neoplastic disease; Multiple myeloma not having achieved remission (HCC); Pain in joint, pelvic region and thigh; Right hip pain; Peripheral edema; Pneumonia; Nodule of left lung; Lung mass; and Blood transfusion reaction on her problem list.    is allergic to codeine; latex; other; onion; zyprexa [olanzapine]; adhesive [tape]; hydrocodone; iodinated diagnostic agents; and sulfa antibiotics.    Medication List       Accurate as of 09/28/15 11:59 PM. Always use your most recent med list.            acetaminophen 325 MG tablet Commonly known as:  TYLENOL Take 650 mg by mouth every 6 (six) hours as needed for mild pain or moderate pain.   albuterol 108 (90 Base) MCG/ACT inhaler Commonly known as:  PROVENTIL HFA;VENTOLIN HFA Inhale 1-2 puffs into the lungs every 6 (six) hours as needed for wheezing or shortness of breath (cough).   ANORO ELLIPTA 62.5-25 MCG/INH Aepb Generic drug:  umeclidinium-vilanterol   ascorbic acid 250 MG Chew Commonly known as:  VITAMIN C Chew  250 mg by mouth daily.   azelastine 0.1 % nasal spray Commonly known as:  ASTELIN Place 2 sprays into both nostrils 2 (two) times daily. Use in each nostril as directed   buPROPion 300 MG 24 hr tablet Commonly known as:  WELLBUTRIN XL Take 1 tablet (300 mg total) by mouth daily.   carboxymethylcellulose 0.5 % Soln Commonly known as:  REFRESH PLUS Place 1 drop into both eyes every 2 (two) hours.   cetirizine 10 MG tablet Commonly known as:  ZYRTEC Take 10 mg by mouth.   Cholecalciferol 4000 units Tabs Take 4,000 Units by mouth daily.   citalopram 40 MG tablet Commonly known as:  CELEXA Take 1 tablet daily or as directed   cromolyn 4 % ophthalmic solution Commonly known as:  OPTICROM Place 1 drop into both eyes 4 (four) times daily.   ferrous sulfate 325 (65 FE) MG tablet Take 1 tablet daily or as directed   fluticasone furoate-vilanterol 100-25 MCG/INH Aepb Commonly known as:  BREO ELLIPTA Inhale 1 puff into the lungs daily.   gabapentin 300 MG capsule Commonly known as:  NEURONTIN TAKE 1 CAPSULE BY MOUTH AT BEDTIME.   isosorbide mononitrate 30 MG 24 hr tablet Commonly known as:  IMDUR Take 1 tablet (30 mg total) by mouth daily.   levothyroxine 150 MCG tablet Commonly known as:  SYNTHROID, LEVOTHROID Take 1 tablet (150 mcg total) by mouth daily.   loperamide 2 MG capsule Commonly known as:  IMODIUM Take two tabs po initially, then one tab after each loose stool: max 8 tabs in 24 hours   loratadine 10 MG tablet Commonly known as:  CLARITIN Take 10 mg by mouth daily.   LORazepam 0.5 MG tablet Commonly known as:  ATIVAN Take 1 tablet (0.5 mg total) by mouth every 8 (eight) hours as needed (nausea).   loteprednol 0.2 % Susp Commonly known as:  ALREX Place 1 drop into both eyes 4 (four) times daily.   meloxicam 15 MG tablet Commonly known as:  MOBIC Take 1 tablet (15 mg total) by mouth daily.   midodrine 10 MG tablet Commonly known as:  PROAMATINE TAKE  1 TABLET BY MOUTH 3 TIMES DAILY.   nitroGLYCERIN 0.4 MG SL tablet Commonly known as:  NITROSTAT Place 0.4 mg under the tongue every 5 (five) minutes as needed for chest pain. Reported on 03/30/2015   omeprazole 40 MG capsule Commonly known as:  PRILOSEC Take 40 mg by mouth.   ondansetron 8 MG disintegrating tablet Commonly known as:  ZOFRAN-ODT Take 1 tablet (8 mg total) by mouth every 8 (eight) hours as needed for nausea or vomiting.   pantoprazole 40 MG tablet Commonly known as:  PROTONIX TAKE 1 TABLET DAILY FOR ACID REFLUX & INDIGESTION   pilocarpine 5 MG tablet Commonly known as:  SALAGEN Take 1 tablet 3 x / day for dry mouth.   promethazine 25 MG tablet Commonly known as:  PHENERGAN Take 25 mg by mouth.   ranitidine 300   MG tablet Commonly known as:  ZANTAC Take 1 to 2 tablets daily for heartburn & reflux to allow wean and transition from PPI / pantoprazole   senna 8.6 MG tablet Commonly known as:  SENOKOT Take 1 tablet by mouth at bedtime.   traMADol 50 MG tablet Commonly known as:  ULTRAM Take 1 to 2 tablets by mouth 4 times a day as needed for pain.        PHYSICAL EXAMINATION  Oncology Vitals 09/29/2015 09/28/2015  Height - -  Weight - -  Weight (lbs) - -  BMI (kg/m2) - -  Temp 98.3 98.9  Pulse 74 64  Resp 18 -  Resp (Historical as of 08/16/11) - -  SpO2 100 98  BSA (m2) - -   BP Readings from Last 2 Encounters:  09/28/15 (!) 108/46  09/13/15 (!) 111/49    Physical Exam  Constitutional: She is oriented to person, place, and time. Vital signs are normal. She appears unhealthy.  HENT:  Head: Normocephalic and atraumatic.  Eyes: Conjunctivae and EOM are normal. Pupils are equal, round, and reactive to light. Right eye exhibits no discharge. Left eye exhibits no discharge. No scleral icterus.  Neck: Normal range of motion.  Pulmonary/Chest: Effort normal. No stridor. No respiratory distress.  Musculoskeletal: Normal range of motion.  Neurological:  She is alert and oriented to person, place, and time.  Skin: Skin is warm and dry. There is pallor.  Psychiatric: Affect normal.  Nursing note and vitals reviewed.   LABORATORY DATA:. Hospital Outpatient Visit on 09/28/2015  Component Date Value Ref Range Status  . Order Confirmation 09/28/2015 ORDER PROCESSED BY BLOOD BANK   Final  . ABO/RH(D) 09/29/2015 O POS   Final  . Antibody Screen 09/29/2015 NEG   Final  . Sample Expiration 09/29/2015 10/01/2015   Final  . Unit Number 09/29/2015 [id number]   Final  . Blood Component Type 09/29/2015 RBC, LR IRR   Final  . Unit division 09/29/2015 00   Final  . Status of Unit 09/29/2015 ALLOCATED   Final  . Transfusion Status 09/29/2015 OK TO TRANSFUSE   Final  . Crossmatch Result 09/29/2015 Compatible   Final  . Unit Number 09/29/2015 [id number]   Final  . Blood Component Type 09/29/2015 RBC, LR IRR   Final  . Unit division 09/29/2015 00   Final  . Status of Unit 09/29/2015 REL FROM ALLOC   Final  . Transfusion Status 09/29/2015 OK TO TRANSFUSE   Final  . Crossmatch Result 09/29/2015 Compatible   Final  . Unit Number 09/29/2015 [id number]   Final  . Blood Component Type 09/29/2015 RBC, LR IRR   Final  . Unit division 09/29/2015 00   Final  . Status of Unit 09/29/2015 ALLOCATED   Final  . Transfusion Status 09/29/2015 OK TO TRANSFUSE   Final  . Crossmatch Result 09/29/2015 Compatible   Final  . Post RXN DAT IgG 09/29/2015 NEG   Final  . DAT C3 09/29/2015 NEG   Final  . Path interp tx rxn 09/29/2015 No laboratory evidence of hemolytic transfusion reaction per John Patrick MD. 09.14.17   Final  . Color, Urine 09/28/2015 YELLOW  YELLOW Final  . APPearance 09/28/2015 CLEAR  CLEAR Final  . Specific Gravity, Urine 09/28/2015 1.015  1.005 - 1.030 Final  . pH 09/28/2015 6.0  5.0 - 8.0 Final  . Glucose, UA 09/28/2015 100* NEGATIVE mg/dL Final  . Hgb urine dipstick 09/28/2015 NEGATIVE  NEGATIVE Final  . Bilirubin Urine   09/28/2015 NEGATIVE   NEGATIVE Final  . Ketones, ur 09/28/2015 NEGATIVE  NEGATIVE mg/dL Final  . Protein, ur 09/28/2015 30* NEGATIVE mg/dL Final  . Nitrite 09/28/2015 NEGATIVE  NEGATIVE Final  . Leukocytes, UA 09/28/2015 NEGATIVE  NEGATIVE Final  . WBC, UA 09/28/2015 NONE SEEN  0 - 5 WBC/hpf Final  . RBC / HPF 09/28/2015 NONE SEEN  0 - 5 RBC/hpf Final  . Bacteria, UA 09/28/2015 NONE SEEN  NONE SEEN Final  . Squamous Epithelial / LPF 09/28/2015 0-5* NONE SEEN Final  Appointment on 09/28/2015  Component Date Value Ref Range Status  . WBC 09/28/2015 9.6  3.9 - 10.3 10e3/uL Final  . NEUT# 09/28/2015 6.8* 1.5 - 6.5 10e3/uL Final  . HGB 09/28/2015 7.6* 11.6 - 15.9 g/dL Final  . HCT 09/28/2015 23.6* 34.8 - 46.6 % Final  . Platelets 09/28/2015 150  145 - 400 10e3/uL Final  . MCV 09/28/2015 116.0* 79.5 - 101.0 fL Final  . MCH 09/28/2015 37.2* 25.1 - 34.0 pg Final  . MCHC 09/28/2015 32.0  31.5 - 36.0 g/dL Final  . RBC 09/28/2015 2.03* 3.70 - 5.45 10e6/uL Final  . RDW 09/28/2015 16.8* 11.2 - 14.5 % Final  . lymph# 09/28/2015 1.3  0.9 - 3.3 10e3/uL Final  . MONO# 09/28/2015 1.3* 0.1 - 0.9 10e3/uL Final  . Eosinophils Absolute 09/28/2015 0.1  0.0 - 0.5 10e3/uL Final  . Basophils Absolute 09/28/2015 0.1  0.0 - 0.1 10e3/uL Final  . NEUT% 09/28/2015 71.1  38.4 - 76.8 % Final  . LYMPH% 09/28/2015 13.4* 14.0 - 49.7 % Final  . MONO% 09/28/2015 13.8  0.0 - 14.0 % Final  . EOS% 09/28/2015 0.9  0.0 - 7.0 % Final  . BASO% 09/28/2015 0.8  0.0 - 2.0 % Final  . Sodium 09/28/2015 140  136 - 145 mEq/L Final  . Potassium 09/28/2015 3.9  3.5 - 5.1 mEq/L Final  . Chloride 09/28/2015 108  98 - 109 mEq/L Final  . CO2 09/28/2015 21* 22 - 29 mEq/L Final  . Glucose 09/28/2015 94  70 - 140 mg/dl Final  . BUN 09/28/2015 43.4* 7.0 - 26.0 mg/dL Final  . Creatinine 09/28/2015 3.1* 0.6 - 1.1 mg/dL Final  . Total Bilirubin 09/28/2015 0.67  0.20 - 1.20 mg/dL Final  . Alkaline Phosphatase 09/28/2015 95  40 - 150 U/L Final  . AST 09/28/2015 11   5 - 34 U/L Final  . ALT 09/28/2015 <9  0 - 55 U/L Final  . Total Protein 09/28/2015 6.7  6.4 - 8.3 g/dL Final  . Albumin 09/28/2015 3.4* 3.5 - 5.0 g/dL Final  . Calcium 09/28/2015 9.4  8.4 - 10.4 mg/dL Final  . Anion Gap 09/28/2015 11  3 - 11 mEq/L Final  . EGFR 09/28/2015 14* >90 ml/min/1.73 m2 Final  . LDH 09/28/2015 152  125 - 245 U/L Final  . IgG, Qn, Serum 09/29/2015 391* 700 - 1,600 mg/dL Final  . IgA, Qn, Serum 09/29/2015 13* 64 - 422 mg/dL Final  . IgM, Qn, Serum 09/29/2015 6* 26 - 217 mg/dL Final    RADIOGRAPHIC STUDIES: Ct Chest Wo Contrast  Result Date: 09/26/2015 CLINICAL DATA:  History of breast cancer and multiple myeloma. Lung nodule seen on recent chest x-ray. EXAM: CT CHEST WITHOUT CONTRAST TECHNIQUE: Multidetector CT imaging of the chest was performed following the standard protocol without IV contrast. COMPARISON:  09/13/2015 FINDINGS: Cardiovascular: The heart size is normal. No pericardial effusion. Coronary artery calcification is noted. Atherosclerotic calcification is noted in  the wall of the thoracic aorta. Right Port-A-Cath tip is positioned at the SVC/ RA junction. Mediastinum/Nodes: Scattered small lymph nodes identified in the mediastinum without lymphadenopathy. No evidence for gross hilar lymphadenopathy although assessment is limited by the lack of intravenous contrast on today's study. Small hiatal hernia noted. Esophagus otherwise unremarkable. There is no axillary lymphadenopathy. Lungs/Pleura: 3.0 x 3.8 cm posterior lingular mass highly suspicious for neoplasm. Centrilobular emphysema noted in the upper lungs bilaterally. Small region of tree in bud peribronchovascular nodularity in the right upper lobe likely related to prior or ongoing atypical infection. No pulmonary edema. No pleural effusion. Upper Abdomen: Abdominal aortic atherosclerosis. Musculoskeletal: Bone windows reveal no worrisome lytic or sclerotic osseous lesions. IMPRESSION: 1. 3.0 x 3.8 cm left  lung mass persistent since 08/23/2015 and new since 12/02/2014. Given the well-defined round configuration, neoplasm is a distinct concern. No evidence for bulky left hilar or mediastinal lymphadenopathy. PET-CT recommended to further evaluate. 2. Emphysema. 3. Abdominal aortic atherosclerosis. Electronically Signed   By: Misty Stanley M.D.   On: 09/26/2015 13:49    ASSESSMENT/PLAN:    Multiple myeloma ([place]) Patient received cycle 10 of her Cytoxan/Kyprolis chemotherapy regimen on 09/28/2015.  She presented to the [facility] today to receive a blood transfusion.  See further notes for details of today's visit.  Patient is scheduled to return tomorrow to receive additional blood transfusion.  She is scheduled to return on 10/05/2015 for her next labs and chemotherapy.  Blood transfusion reaction Patient's hemoglobin is 7.6.  She presented today to the [facility] for her first unit of packed red blood cells transfusion.  She was in the midst of the transfusion; when she developed some flushing and feeling hot all over.  She denied any other new symptoms whatsoever.  She denied any chest pain or shortness of breath.  Her vital signs remained stable.  Blood transfusion was immediately held; and patient was given Benadryl and Pepcid per protocol..  Blood transfusion reaction protocol was initiated and all labs / urine drawn as directed.   All symptoms completely resolved; and patient was actually her baseline.  She was given instructions regarding both Benadryl and Pepcid overnight.  If needed.  She was also instructed to go directly to the emergency department for any worsening symptoms whatsoever.  Patient has been rescheduled to receive the 2 units of packed red blood cell transfusion tomorrow 09/29/2015.  [facility].  She will be premedicated prior to the blood transfusion tomorrow.   Patient stated understanding of all instructions; and was in agreement with this plan of care. The  patient knows to call the clinic with any problems, questions or concerns.   Total time spent with patient was greater than 15 minutes;  with greater than  75  percent of that time spent in face to face counseling regarding patient's symptoms,  and coordination of care and follow up.  Disclaimer:This dictation was prepared with Dragon/digital dictation along with [employer]. Any transcriptional errors that result from this process are unintentional.  Drue Second, NP 09/29/2015

## 2015-09-30 ENCOUNTER — Other Ambulatory Visit: Payer: Self-pay | Admitting: Internal Medicine

## 2015-10-03 DIAGNOSIS — D649 Anemia, unspecified: Secondary | ICD-10-CM | POA: Diagnosis not present

## 2015-10-04 LAB — TYPE AND SCREEN
ABO/RH(D): O POS
ANTIBODY SCREEN: NEGATIVE
UNIT DIVISION: 0
Unit division: 0
Unit division: 0

## 2015-10-05 ENCOUNTER — Other Ambulatory Visit: Payer: Self-pay | Admitting: Medical Oncology

## 2015-10-05 ENCOUNTER — Other Ambulatory Visit (HOSPITAL_BASED_OUTPATIENT_CLINIC_OR_DEPARTMENT_OTHER): Payer: Medicare Other

## 2015-10-05 ENCOUNTER — Encounter: Payer: Self-pay | Admitting: Pharmacist

## 2015-10-05 ENCOUNTER — Ambulatory Visit (HOSPITAL_BASED_OUTPATIENT_CLINIC_OR_DEPARTMENT_OTHER): Payer: Medicare Other

## 2015-10-05 VITALS — BP 115/75 | HR 76 | Temp 98.5°F | Resp 20

## 2015-10-05 DIAGNOSIS — C9000 Multiple myeloma not having achieved remission: Secondary | ICD-10-CM

## 2015-10-05 DIAGNOSIS — Z5111 Encounter for antineoplastic chemotherapy: Secondary | ICD-10-CM

## 2015-10-05 DIAGNOSIS — Z5112 Encounter for antineoplastic immunotherapy: Secondary | ICD-10-CM

## 2015-10-05 LAB — CBC WITH DIFFERENTIAL/PLATELET
BASO%: 0.2 % (ref 0.0–2.0)
BASOS ABS: 0 10*3/uL (ref 0.0–0.1)
EOS%: 4.6 % (ref 0.0–7.0)
Eosinophils Absolute: 0.3 10*3/uL (ref 0.0–0.5)
HEMATOCRIT: 33.8 % — AB (ref 34.8–46.6)
HEMOGLOBIN: 11.2 g/dL — AB (ref 11.6–15.9)
LYMPH#: 1.2 10*3/uL (ref 0.9–3.3)
LYMPH%: 19.3 % (ref 14.0–49.7)
MCH: 34.7 pg — ABNORMAL HIGH (ref 25.1–34.0)
MCHC: 33.1 g/dL (ref 31.5–36.0)
MCV: 104.6 fL — ABNORMAL HIGH (ref 79.5–101.0)
MONO#: 0.6 10*3/uL (ref 0.1–0.9)
MONO%: 9.1 % (ref 0.0–14.0)
NEUT#: 4.3 10*3/uL (ref 1.5–6.5)
NEUT%: 66.8 % (ref 38.4–76.8)
PLATELETS: 80 10*3/uL — AB (ref 145–400)
RBC: 3.23 10*6/uL — ABNORMAL LOW (ref 3.70–5.45)
RDW: 18.4 % — AB (ref 11.2–14.5)
WBC: 6.4 10*3/uL (ref 3.9–10.3)

## 2015-10-05 LAB — COMPREHENSIVE METABOLIC PANEL
ALBUMIN: 3.3 g/dL — AB (ref 3.5–5.0)
ALK PHOS: 98 U/L (ref 40–150)
ALT: 11 U/L (ref 0–55)
ANION GAP: 13 meq/L — AB (ref 3–11)
AST: 9 U/L (ref 5–34)
BILIRUBIN TOTAL: 0.6 mg/dL (ref 0.20–1.20)
BUN: 52.5 mg/dL — AB (ref 7.0–26.0)
CALCIUM: 10.5 mg/dL — AB (ref 8.4–10.4)
CHLORIDE: 106 meq/L (ref 98–109)
CO2: 21 mEq/L — ABNORMAL LOW (ref 22–29)
CREATININE: 3.4 mg/dL — AB (ref 0.6–1.1)
EGFR: 13 mL/min/{1.73_m2} — ABNORMAL LOW (ref >90)
Glucose: 113 mg/dl (ref 70–140)
Potassium: 3.7 mEq/L (ref 3.5–5.1)
Sodium: 141 mEq/L (ref 136–145)
TOTAL PROTEIN: 6.9 g/dL (ref 6.4–8.3)

## 2015-10-05 MED ORDER — HEPARIN SOD (PORK) LOCK FLUSH 100 UNIT/ML IV SOLN
500.0000 [IU] | Freq: Once | INTRAVENOUS | Status: AC | PRN
  Administered 2015-10-05: 500 [IU]
  Filled 2015-10-05: qty 5

## 2015-10-05 MED ORDER — SODIUM CHLORIDE 0.9 % IJ SOLN
10.0000 mL | INTRAMUSCULAR | Status: DC | PRN
  Administered 2015-10-05: 10 mL
  Filled 2015-10-05: qty 10

## 2015-10-05 MED ORDER — SODIUM CHLORIDE 0.9 % IV SOLN: Freq: Once | INTRAVENOUS | Status: DC

## 2015-10-05 MED ORDER — DEXTROSE 5 % IV SOLN
27.0000 mg/m2 | Freq: Once | INTRAVENOUS | Status: AC
  Administered 2015-10-05: 50 mg via INTRAVENOUS
  Filled 2015-10-05: qty 25

## 2015-10-05 MED ORDER — SODIUM CHLORIDE 0.9 % IV SOLN
Freq: Once | INTRAVENOUS | Status: AC
  Administered 2015-10-05: 15:00:00 via INTRAVENOUS

## 2015-10-05 MED ORDER — SODIUM CHLORIDE 0.9 % IV SOLN
Freq: Once | INTRAVENOUS | Status: AC
  Administered 2015-10-05: 15:00:00 via INTRAVENOUS
  Filled 2015-10-05: qty 4

## 2015-10-05 MED ORDER — SODIUM CHLORIDE 0.9 % IV SOLN
225.0000 mg/m2 | Freq: Once | INTRAVENOUS | Status: AC
  Administered 2015-10-05: 420 mg via INTRAVENOUS
  Filled 2015-10-05: qty 21

## 2015-10-05 NOTE — Patient Instructions (Signed)
[facility] Discharge Instructions for Patients Receiving Chemotherapy  Today you received the following chemotherapy agents, Cytoxan and Kyprolis   To help prevent nausea and vomiting after your treatment, we encourage you to take your nausea medication as directed.    If you develop nausea and vomiting that is not controlled by your nausea medication, call the clinic.   BELOW ARE SYMPTOMS THAT SHOULD BE REPORTED IMMEDIATELY:  *FEVER GREATER THAN 100.5 F  *CHILLS WITH OR WITHOUT FEVER  NAUSEA AND VOMITING THAT IS NOT CONTROLLED WITH YOUR NAUSEA MEDICATION  *UNUSUAL SHORTNESS OF BREATH  *UNUSUAL BRUISING OR BLEEDING  TENDERNESS IN MOUTH AND THROAT WITH OR WITHOUT PRESENCE OF ULCERS  *URINARY PROBLEMS  *BOWEL PROBLEMS  UNUSUAL RASH Items with * indicate a potential emergency and should be followed up as soon as possible.  Feel free to call the clinic you have any questions or concerns. The clinic phone number is [phone].  Please show the [facility] at check-in to the Emergency Department and triage nurse.

## 2015-10-05 NOTE — Progress Notes (Signed)
MD: ok for tx w/ Plct = 80 today.  Kennith Center, Pharm.D., CPP 10/05/2015'@2'$ :34 PM

## 2015-10-05 NOTE — Progress Notes (Signed)
Confirmed with patient that she took her steroid at home prior to today's visit.

## 2015-10-06 ENCOUNTER — Ambulatory Visit (HOSPITAL_BASED_OUTPATIENT_CLINIC_OR_DEPARTMENT_OTHER): Payer: Medicare Other

## 2015-10-06 VITALS — BP 122/60 | HR 72 | Temp 99.1°F | Resp 20

## 2015-10-06 DIAGNOSIS — Z5112 Encounter for antineoplastic immunotherapy: Secondary | ICD-10-CM

## 2015-10-06 DIAGNOSIS — C9000 Multiple myeloma not having achieved remission: Secondary | ICD-10-CM | POA: Diagnosis not present

## 2015-10-06 MED ORDER — DEXTROSE 5 % IV SOLN
27.0000 mg/m2 | Freq: Once | INTRAVENOUS | Status: AC
  Administered 2015-10-06: 50 mg via INTRAVENOUS
  Filled 2015-10-06: qty 25

## 2015-10-06 MED ORDER — SODIUM CHLORIDE 0.9 % IV SOLN
Freq: Once | INTRAVENOUS | Status: AC
  Administered 2015-10-06: 14:00:00 via INTRAVENOUS

## 2015-10-06 MED ORDER — SODIUM CHLORIDE 0.9 % IV SOLN
Freq: Once | INTRAVENOUS | Status: AC
  Administered 2015-10-06: 13:00:00 via INTRAVENOUS

## 2015-10-06 MED ORDER — DEXAMETHASONE SODIUM PHOSPHATE 100 MG/10ML IJ SOLN
Freq: Once | INTRAMUSCULAR | Status: AC
  Administered 2015-10-06: 14:00:00 via INTRAVENOUS
  Filled 2015-10-06: qty 4

## 2015-10-06 MED ORDER — HEPARIN SOD (PORK) LOCK FLUSH 100 UNIT/ML IV SOLN
500.0000 [IU] | Freq: Once | INTRAVENOUS | Status: AC | PRN
  Administered 2015-10-06: 500 [IU]
  Filled 2015-10-06: qty 5

## 2015-10-06 MED ORDER — SODIUM CHLORIDE 0.9 % IJ SOLN
10.0000 mL | INTRAMUSCULAR | Status: DC | PRN
  Administered 2015-10-06: 10 mL
  Filled 2015-10-06: qty 10

## 2015-10-06 NOTE — Patient Instructions (Signed)
[facility] Discharge Instructions for Patients Receiving Chemotherapy  Today you received the following chemotherapy agents: Kyprolis   To help prevent nausea and vomiting after your treatment, we encourage you to take your nausea medication as directed.    If you develop nausea and vomiting that is not controlled by your nausea medication, call the clinic.   BELOW ARE SYMPTOMS THAT SHOULD BE REPORTED IMMEDIATELY:  *FEVER GREATER THAN 100.5 F  *CHILLS WITH OR WITHOUT FEVER  NAUSEA AND VOMITING THAT IS NOT CONTROLLED WITH YOUR NAUSEA MEDICATION  *UNUSUAL SHORTNESS OF BREATH  *UNUSUAL BRUISING OR BLEEDING  TENDERNESS IN MOUTH AND THROAT WITH OR WITHOUT PRESENCE OF ULCERS  *URINARY PROBLEMS  *BOWEL PROBLEMS  UNUSUAL RASH Items with * indicate a potential emergency and should be followed up as soon as possible.  Feel free to call the clinic you have any questions or concerns. The clinic phone number is [phone].  Please show the CHEMO ALERT CARD at check-in to the Emergency Department and triage nurse.   

## 2015-10-06 NOTE — Progress Notes (Signed)
Per [name] okay to treat with Creatinine 3.4 and Platelets of 80 (10/05/15 labs)

## 2015-10-11 ENCOUNTER — Other Ambulatory Visit: Payer: Self-pay | Admitting: *Deleted

## 2015-10-11 ENCOUNTER — Ambulatory Visit (HOSPITAL_COMMUNITY)
Admission: RE | Admit: 2015-10-11 | Discharge: 2015-10-11 | Disposition: A | Discharge status: Home / Self Care | Payer: Medicare Other | Source: Ambulatory Visit | Attending: Internal Medicine | Admitting: Internal Medicine

## 2015-10-11 DIAGNOSIS — D3502 Benign neoplasm of left adrenal gland: Secondary | ICD-10-CM | POA: Insufficient documentation

## 2015-10-11 DIAGNOSIS — R918 Other nonspecific abnormal finding of lung field: Secondary | ICD-10-CM | POA: Diagnosis not present

## 2015-10-11 DIAGNOSIS — C9000 Multiple myeloma not having achieved remission: Secondary | ICD-10-CM

## 2015-10-11 LAB — GLUCOSE, CAPILLARY: GLUCOSE-CAPILLARY: 118 mg/dL — AB (ref 65–99)

## 2015-10-11 MED ORDER — FLUDEOXYGLUCOSE F - 18 (FDG) INJECTION
7.9900 | Freq: Once | INTRAVENOUS | Status: AC | PRN
  Administered 2015-10-11: 7.99 via INTRAVENOUS

## 2015-10-12 ENCOUNTER — Other Ambulatory Visit: Payer: Self-pay | Admitting: Medical Oncology

## 2015-10-12 ENCOUNTER — Ambulatory Visit: Payer: Medicare Other

## 2015-10-12 ENCOUNTER — Ambulatory Visit (HOSPITAL_BASED_OUTPATIENT_CLINIC_OR_DEPARTMENT_OTHER): Payer: Medicare Other | Admitting: Hematology

## 2015-10-12 VITALS — BP 125/56 | HR 51 | Temp 98.8°F | Resp 20

## 2015-10-12 DIAGNOSIS — Z5112 Encounter for antineoplastic immunotherapy: Secondary | ICD-10-CM | POA: Diagnosis not present

## 2015-10-12 DIAGNOSIS — C9000 Multiple myeloma not having achieved remission: Secondary | ICD-10-CM

## 2015-10-12 DIAGNOSIS — R110 Nausea: Secondary | ICD-10-CM

## 2015-10-12 LAB — CBC WITH DIFFERENTIAL/PLATELET
BASO%: 0.7 % (ref 0.0–2.0)
Basophils Absolute: 0 10*3/uL (ref 0.0–0.1)
EOS%: 5.5 % (ref 0.0–7.0)
Eosinophils Absolute: 0.3 10*3/uL (ref 0.0–0.5)
HCT: 31.2 % — ABNORMAL LOW (ref 34.8–46.6)
HEMOGLOBIN: 10.2 g/dL — AB (ref 11.6–15.9)
LYMPH%: 19.1 % (ref 14.0–49.7)
MCH: 34.6 pg — AB (ref 25.1–34.0)
MCHC: 32.7 g/dL (ref 31.5–36.0)
MCV: 105.8 fL — ABNORMAL HIGH (ref 79.5–101.0)
MONO#: 1 10*3/uL — ABNORMAL HIGH (ref 0.1–0.9)
MONO%: 19.3 % — AB (ref 0.0–14.0)
NEUT%: 55.4 % (ref 38.4–76.8)
NEUTROS ABS: 2.9 10*3/uL (ref 1.5–6.5)
Platelets: 69 10*3/uL — ABNORMAL LOW (ref 145–400)
RBC: 2.95 10*6/uL — ABNORMAL LOW (ref 3.70–5.45)
RDW: 19.1 % — AB (ref 11.2–14.5)
WBC: 5.2 10*3/uL (ref 3.9–10.3)
lymph#: 1 10*3/uL (ref 0.9–3.3)

## 2015-10-12 LAB — COMPREHENSIVE METABOLIC PANEL
ALBUMIN: 3.1 g/dL — AB (ref 3.5–5.0)
ALK PHOS: 104 U/L (ref 40–150)
ALT: 14 U/L (ref 0–55)
AST: 12 U/L (ref 5–34)
Anion Gap: 11 mEq/L (ref 3–11)
BUN: 43.7 mg/dL — AB (ref 7.0–26.0)
CALCIUM: 9.6 mg/dL (ref 8.4–10.4)
CHLORIDE: 109 meq/L (ref 98–109)
CO2: 21 mEq/L — ABNORMAL LOW (ref 22–29)
CREATININE: 2.9 mg/dL — AB (ref 0.6–1.1)
EGFR: 15 mL/min/{1.73_m2} — ABNORMAL LOW (ref >90)
GLUCOSE: 93 mg/dL (ref 70–140)
POTASSIUM: 4.6 meq/L (ref 3.5–5.1)
SODIUM: 140 meq/L (ref 136–145)
Total Bilirubin: 0.72 mg/dL (ref 0.20–1.20)
Total Protein: 6.2 g/dL — ABNORMAL LOW (ref 6.4–8.3)

## 2015-10-12 MED ORDER — SODIUM CHLORIDE 0.9 % IV SOLN
Freq: Once | INTRAVENOUS | Status: AC
  Administered 2015-10-12: 11:00:00 via INTRAVENOUS
  Filled 2015-10-12: qty 4

## 2015-10-12 MED ORDER — HEPARIN SOD (PORK) LOCK FLUSH 100 UNIT/ML IV SOLN
500.0000 [IU] | Freq: Once | INTRAVENOUS | Status: AC | PRN
  Administered 2015-10-12: 500 [IU]
  Filled 2015-10-12: qty 5

## 2015-10-12 MED ORDER — SODIUM CHLORIDE 0.9 % IJ SOLN
10.0000 mL | INTRAMUSCULAR | Status: DC | PRN
Start: 2015-10-12 — End: 2015-10-12
  Administered 2015-10-12: 10 mL
  Filled 2015-10-12: qty 10

## 2015-10-12 MED ORDER — CARFILZOMIB CHEMO INJECTION 60 MG
27.0000 mg/m2 | Freq: Once | INTRAVENOUS | Status: AC
  Administered 2015-10-12: 50 mg via INTRAVENOUS
  Filled 2015-10-12: qty 25

## 2015-10-12 MED ORDER — SODIUM CHLORIDE 0.9 % IV SOLN
Freq: Once | INTRAVENOUS | Status: AC
  Administered 2015-10-12: 13:00:00 via INTRAVENOUS
  Filled 2015-10-12: qty 4

## 2015-10-12 MED ORDER — SODIUM CHLORIDE 0.9 % IV SOLN
225.0000 mg/m2 | Freq: Once | INTRAVENOUS | Status: AC
  Administered 2015-10-12: 420 mg via INTRAVENOUS
  Filled 2015-10-12: qty 21

## 2015-10-12 MED ORDER — SODIUM CHLORIDE 0.9 % IV SOLN
Freq: Once | INTRAVENOUS | Status: AC
  Administered 2015-10-12: 10:00:00 via INTRAVENOUS

## 2015-10-12 MED ORDER — SODIUM CHLORIDE 0.9 % IV SOLN
Freq: Once | INTRAVENOUS | Status: AC
  Administered 2015-10-12: 11:00:00 via INTRAVENOUS

## 2015-10-12 NOTE — Progress Notes (Signed)
1245 Pt c/o severe nausea.  No other complaints.  Order rcvd for 8 IV zofran.

## 2015-10-13 ENCOUNTER — Ambulatory Visit (HOSPITAL_BASED_OUTPATIENT_CLINIC_OR_DEPARTMENT_OTHER): Payer: Medicare Other

## 2015-10-13 VITALS — BP 120/49 | HR 107 | Temp 98.7°F | Resp 20

## 2015-10-13 DIAGNOSIS — Z5112 Encounter for antineoplastic immunotherapy: Secondary | ICD-10-CM | POA: Diagnosis present

## 2015-10-13 DIAGNOSIS — C9000 Multiple myeloma not having achieved remission: Secondary | ICD-10-CM

## 2015-10-13 MED ORDER — SODIUM CHLORIDE 0.9 % IJ SOLN
10.0000 mL | INTRAMUSCULAR | Status: DC | PRN
  Administered 2015-10-13: 10 mL
  Filled 2015-10-13: qty 10

## 2015-10-13 MED ORDER — SODIUM CHLORIDE 0.9 % IV SOLN
Freq: Once | INTRAVENOUS | Status: AC
  Administered 2015-10-13: 14:00:00 via INTRAVENOUS

## 2015-10-13 MED ORDER — HEPARIN SOD (PORK) LOCK FLUSH 100 UNIT/ML IV SOLN
500.0000 [IU] | Freq: Once | INTRAVENOUS | Status: AC | PRN
  Administered 2015-10-13: 500 [IU]
  Filled 2015-10-13: qty 5

## 2015-10-13 MED ORDER — DEXTROSE 5 % IV SOLN
27.0000 mg/m2 | Freq: Once | INTRAVENOUS | Status: AC
  Administered 2015-10-13: 50 mg via INTRAVENOUS
  Filled 2015-10-13: qty 25

## 2015-10-13 MED ORDER — SODIUM CHLORIDE 0.9 % IV SOLN
Freq: Once | INTRAVENOUS | Status: AC
  Administered 2015-10-13: 14:00:00 via INTRAVENOUS
  Filled 2015-10-13: qty 4

## 2015-10-13 NOTE — Patient Instructions (Signed)
[facility] Discharge Instructions for Patients Receiving Chemotherapy  Today you received the following chemotherapy agents: Kyprolis   To help prevent nausea and vomiting after your treatment, we encourage you to take your nausea medication as directed.    If you develop nausea and vomiting that is not controlled by your nausea medication, call the clinic.   BELOW ARE SYMPTOMS THAT SHOULD BE REPORTED IMMEDIATELY:  *FEVER GREATER THAN 100.5 F  *CHILLS WITH OR WITHOUT FEVER  NAUSEA AND VOMITING THAT IS NOT CONTROLLED WITH YOUR NAUSEA MEDICATION  *UNUSUAL SHORTNESS OF BREATH  *UNUSUAL BRUISING OR BLEEDING  TENDERNESS IN MOUTH AND THROAT WITH OR WITHOUT PRESENCE OF ULCERS  *URINARY PROBLEMS  *BOWEL PROBLEMS  UNUSUAL RASH Items with * indicate a potential emergency and should be followed up as soon as possible.  Feel free to call the clinic you have any questions or concerns. The clinic phone number is [phone].  Please show the CHEMO ALERT CARD at check-in to the Emergency Department and triage nurse.   

## 2015-10-18 ENCOUNTER — Other Ambulatory Visit: Payer: Self-pay | Admitting: Internal Medicine

## 2015-10-18 ENCOUNTER — Other Ambulatory Visit: Payer: Medicare Other

## 2015-10-18 ENCOUNTER — Telehealth: Payer: Self-pay | Admitting: Internal Medicine

## 2015-10-18 ENCOUNTER — Telehealth: Payer: Self-pay | Admitting: Medical Oncology

## 2015-10-18 ENCOUNTER — Encounter: Payer: Self-pay | Admitting: Internal Medicine

## 2015-10-18 ENCOUNTER — Ambulatory Visit: Payer: Medicare Other | Admitting: Internal Medicine

## 2015-10-18 ENCOUNTER — Ambulatory Visit: Payer: Medicare Other

## 2015-10-18 DIAGNOSIS — H10413 Chronic giant papillary conjunctivitis, bilateral: Secondary | ICD-10-CM | POA: Diagnosis not present

## 2015-10-18 DIAGNOSIS — H02052 Trichiasis without entropian right lower eyelid: Secondary | ICD-10-CM | POA: Diagnosis not present

## 2015-10-18 DIAGNOSIS — R918 Other nonspecific abnormal finding of lung field: Secondary | ICD-10-CM

## 2015-10-18 MED ORDER — FLUTICASONE PROPIONATE 50 MCG/ACT NA SUSP: 2.0000 | Freq: Every day | NASAL | 0 refills | Status: DC

## 2015-10-18 MED ORDER — MONTELUKAST SODIUM 10 MG PO TABS: 10.0000 mg | ORAL_TABLET | Freq: Every day | ORAL | 2 refills | Status: DC

## 2015-10-18 NOTE — Telephone Encounter (Signed)
LM for pt x 1  

## 2015-10-18 NOTE — Telephone Encounter (Signed)
I can't tell from this if something new has happened recently. If she notices increased cough or wheeze then we can try sending a prednisone taper. Can she be worked in with a an NP to assess before scheduled lung biopsy?

## 2015-10-18 NOTE — Telephone Encounter (Signed)
ATC, automated message stating that the number dialed is not a working number. No other number listed in patient contacts. Unable to reach pt daughter (EC), LM with daughter for patient to contact our office.  Alternative number given for patient --(H) [phone] --- Called home number and spoke with patient.  Pt c/o increased SOB with decreased endurance. Pt notes some pain and she has been told this is from diaphragm spasm. Using Albuterol HFA, seems to help some with breathlessness. Using [place] daily.  Pt is unable to walk to her mailbox without feeling weak and breathless when she gets back inside.  Pt reports being told she has a nodule in left lung and is scheduled for Bx on Friday at [facility]. Pt sees Dr Earlie Server.  Pt states that on a recent PET scan it showed COPD but did not show if there was any worsening in her condition.    Please advise Dr Annamaria Boots. Thanks.      Medication List       Accurate as of 10/18/15  2:35 PM. Always use your most recent med list.          acetaminophen 325 MG tablet Commonly known as:  TYLENOL Take 650 mg by mouth every 6 (six) hours as needed for mild pain or moderate pain.   albuterol 108 (90 Base) MCG/ACT inhaler Commonly known as:  PROVENTIL HFA;VENTOLIN HFA Inhale 1-2 puffs into the lungs every 6 (six) hours as needed for wheezing or shortness of breath (cough).   ANORO ELLIPTA 62.5-25 MCG/INH Aepb Generic drug:  umeclidinium-vilanterol   ascorbic acid 250 MG Chew Commonly known as:  VITAMIN C Chew 250 mg by mouth daily.   azelastine 0.1 % nasal spray Commonly known as:  ASTELIN Place 2 sprays into both nostrils 2 (two) times daily. Use in each nostril as directed   buPROPion 300 MG 24 hr tablet Commonly known as:  WELLBUTRIN XL Take 1 tablet (300 mg total) by mouth daily.   carboxymethylcellulose 0.5 % Soln Commonly known as:  REFRESH PLUS Place 1 drop into both eyes every 2 (two) hours.   cetirizine 10 MG tablet Commonly known as:   ZYRTEC Take 10 mg by mouth.   Cholecalciferol 4000 units Tabs Take 4,000 Units by mouth daily.   citalopram 40 MG tablet Commonly known as:  CELEXA Take 1 tablet daily or as directed   cromolyn 4 % ophthalmic solution Commonly known as:  OPTICROM Place 1 drop into both eyes 4 (four) times daily.   ferrous sulfate 325 (65 FE) MG tablet Take 1 tablet daily or as directed   fluticasone 50 MCG/ACT nasal spray Commonly known as:  FLONASE Place 2 sprays into both nostrils daily.   fluticasone furoate-vilanterol 100-25 MCG/INH Aepb Commonly known as:  BREO ELLIPTA Inhale 1 puff into the lungs daily.   gabapentin 300 MG capsule Commonly known as:  NEURONTIN TAKE 1 CAPSULE BY MOUTH AT BEDTIME.   gabapentin 300 MG capsule Commonly known as:  NEURONTIN TAKE 1 CAPSULE BY MOUTH AT BEDTIME.   isosorbide mononitrate 30 MG 24 hr tablet Commonly known as:  IMDUR Take 1 tablet (30 mg total) by mouth daily.   levothyroxine 150 MCG tablet Commonly known as:  SYNTHROID, LEVOTHROID Take 1 tablet (150 mcg total) by mouth daily.   loperamide 2 MG capsule Commonly known as:  IMODIUM Take two tabs po initially, then one tab after each loose stool: max 8 tabs in 24 hours   loratadine 10 MG tablet Commonly  known as:  CLARITIN Take 10 mg by mouth daily.   LORazepam 0.5 MG tablet Commonly known as:  ATIVAN Take 1 tablet (0.5 mg total) by mouth every 8 (eight) hours as needed (nausea).   loteprednol 0.2 % Susp Commonly known as:  ALREX Place 1 drop into both eyes 4 (four) times daily.   meloxicam 15 MG tablet Commonly known as:  MOBIC Take 1 tablet (15 mg total) by mouth daily.   midodrine 10 MG tablet Commonly known as:  PROAMATINE TAKE 1 TABLET BY MOUTH 3 TIMES DAILY.   montelukast 10 MG tablet Commonly known as:  SINGULAIR Take 1 tablet (10 mg total) by mouth daily.   nitroGLYCERIN 0.4 MG SL tablet Commonly known as:  NITROSTAT Place 0.4 mg under the tongue every 5 (five)  minutes as needed for chest pain. Reported on 03/30/2015   omeprazole 40 MG capsule Commonly known as:  PRILOSEC Take 40 mg by mouth.   ondansetron 8 MG disintegrating tablet Commonly known as:  ZOFRAN-ODT Take 1 tablet (8 mg total) by mouth every 8 (eight) hours as needed for nausea or vomiting.   pantoprazole 40 MG tablet Commonly known as:  PROTONIX TAKE 1 TABLET DAILY FOR ACID REFLUX & INDIGESTION   pilocarpine 5 MG tablet Commonly known as:  SALAGEN Take 1 tablet 3 x / day for dry mouth.   promethazine 25 MG tablet Commonly known as:  PHENERGAN Take 25 mg by mouth.   ranitidine 300 MG tablet Commonly known as:  ZANTAC Take 1 to 2 tablets daily for heartburn & reflux to allow wean and transition from PPI / pantoprazole   senna 8.6 MG tablet Commonly known as:  SENOKOT Take 1 tablet by mouth at bedtime.   traMADol 50 MG tablet Commonly known as:  ULTRAM Take 1 to 2 tablets by mouth 4 times a day as needed for pain.

## 2015-10-18 NOTE — Telephone Encounter (Signed)
Pt notifed.

## 2015-10-18 NOTE — Telephone Encounter (Signed)
Called pt about order for bx lung from PET scan report- phone rang x 10 then stopped , no answer or machine.

## 2015-10-19 ENCOUNTER — Other Ambulatory Visit: Payer: Self-pay | Admitting: Radiology

## 2015-10-19 ENCOUNTER — Ambulatory Visit: Payer: Medicare Other

## 2015-10-19 NOTE — Telephone Encounter (Signed)
Spoke with pt. She agrees with CY that she should be seen before her lung biopsy. Pt has been scheduled to see TP on 10/20/15 at 3pm. Nothing further was needed.

## 2015-10-20 ENCOUNTER — Encounter: Payer: Self-pay | Admitting: Adult Health

## 2015-10-20 ENCOUNTER — Other Ambulatory Visit: Payer: Self-pay | Admitting: General Surgery

## 2015-10-20 ENCOUNTER — Ambulatory Visit (INDEPENDENT_AMBULATORY_CARE_PROVIDER_SITE_OTHER): Payer: Medicare Other | Admitting: Adult Health

## 2015-10-20 ENCOUNTER — Other Ambulatory Visit (INDEPENDENT_AMBULATORY_CARE_PROVIDER_SITE_OTHER): Payer: Medicare Other

## 2015-10-20 VITALS — BP 110/62 | HR 71 | Temp 98.2°F | Ht 65.0 in | Wt 156.2 lb

## 2015-10-20 DIAGNOSIS — R918 Other nonspecific abnormal finding of lung field: Secondary | ICD-10-CM | POA: Diagnosis not present

## 2015-10-20 DIAGNOSIS — R0602 Shortness of breath: Secondary | ICD-10-CM | POA: Diagnosis not present

## 2015-10-20 DIAGNOSIS — J439 Emphysema, unspecified: Secondary | ICD-10-CM | POA: Diagnosis not present

## 2015-10-20 LAB — BASIC METABOLIC PANEL
BUN: 32 mg/dL — ABNORMAL HIGH (ref 6–23)
CHLORIDE: 109 meq/L (ref 96–112)
CO2: 25 meq/L (ref 19–32)
Calcium: 9.3 mg/dL (ref 8.4–10.5)
Creatinine, Ser: 2.84 mg/dL — ABNORMAL HIGH (ref 0.40–1.20)
GFR: 17.27 mL/min — ABNORMAL LOW (ref >60.00)
Glucose, Bld: 114 mg/dL — ABNORMAL HIGH (ref 70–99)
Potassium: 4.1 mEq/L (ref 3.5–5.1)
SODIUM: 142 meq/L (ref 135–145)

## 2015-10-20 LAB — BRAIN NATRIURETIC PEPTIDE: PRO B NATRI PEPTIDE: 465 pg/mL — AB (ref 0.0–100.0)

## 2015-10-20 MED ORDER — TIOTROPIUM BROMIDE MONOHYDRATE 2.5 MCG/ACT IN AERS: 2.0000 | INHALATION_SPRAY | Freq: Every day | RESPIRATORY_TRACT | 5 refills | Status: DC

## 2015-10-20 NOTE — Progress Notes (Signed)
Subjective:    Patient ID: Nancy Norris, female    DOB: Nov 12, 1940, 75 y.o.   MRN: [id number]  HPI 75 yo female former smoker followed for Moderate COPD GOLD II  Undergoing active treatment for recurrent multiple myeloma .  Stage 4 CKD .  Stem cell transplant 2011 Hx of breast cancer s/p mastectomy . And Diastolic CHF   TEST  CT chest 09/26/15 >3.0 x 3.8 cm left lung mass persistent since 08/23/2015 and new since 12/02/2014 PET scan  10/11/15 >7.8 cm hypermetabolic of mass within the lingula, highly suspicious for primary bronchogenic carcinoma. Spirometry 10/20/2015 >FEV1 69%, ratio 69 , FVC 75     10/20/2015 Acute OV : COPD  Complains of 2 months of progressive DOE  Developed cough , congestion in August. 08/2015 CXR showed left sided nodule. Tx w/ abx.  Follow up cxr showed persistent nodulairty on left. Subsequent CT chest 09/26/15 >3.0 x 3.8 cm left lung mass persistent since 08/23/2015 and new since 12/02/2014 PET scan  10/11/15 >9.3 cm hypermetabolic of mass within the lingula, highly suspicious for primary bronchogenic carcinoma. She has a scheduled CT guided lung biopsy tomorrow.  Spirometry today shows an FEV1 at 69%, ratio 69, which is stable with her last PFT done in 2015. Walk test in the office did show O2 saturation decreased at 89% walking, with quick, rebound into the mid to upper 90s at rest.  No weight loss or hemoptysis. No fever . No increased edema. No calf pain . No discolored mucus .   Echo 11/2014 EF 50-55%, GR 1 DD .  Cardiac stress test 02/2013 nml .    Past Medical History:  Diagnosis Date  . Anemia   . Anginal pain ([place])    used NTG x 2 May 31 and 06/15/13   . Anxiety   . Arthritis   . B12 deficiency 12/04/2014  . Breast cancer ([place])   . CKD (chronic kidney disease) stage 3, GFR 30-59 ml/min   . Complication of anesthesia   . COPD (chronic obstructive pulmonary disease) ([place])   . Depression   . Dizziness   . Fibromyalgia   . Fibromyalgia     . GERD (gastroesophageal reflux disease)   . Headache(784.0)   . History of blood transfusion    last one May 12   . Hx of cardiovascular stress test    LexiScan with low level exercise Myoview (02/2013): No ischemia, EF 72%; normal study  . Hx of echocardiogram    a.  Echocardiogram (12/26/2012): EF 81-01%, grade 1 diastolic dysfunction;   b.  Echocardiogram (02/2013): EF 55-60%, no WMA, trivial effusion  . Hyperkalemia   . Hyponatremia   . Hypothyroidism   . Mucositis   . Multiple myeloma   . Myocardial infarction    in past, patient was unaware.   . Neuropathy ([place])   . Pain in joint, pelvic region and thigh 07/07/2015  . PONV (postoperative nausea and vomiting) 2008   after mastestomy   Current Outpatient Prescriptions on File Prior to Visit  Medication Sig Dispense Refill  . acetaminophen (TYLENOL) 325 MG tablet Take 650 mg by mouth every 6 (six) hours as needed for mild pain or moderate pain.     Marland Kitchen albuterol (PROVENTIL HFA;VENTOLIN HFA) 108 (90 Base) MCG/ACT inhaler Inhale 1-2 puffs into the lungs every 6 (six) hours as needed for wheezing or shortness of breath (cough). 1 Inhaler 3  . ANORO ELLIPTA 62.5-25 MCG/INH AEPB Inhale 1 puff into the  lungs daily.   0  . ascorbic acid (VITAMIN C) 250 MG CHEW Chew 250 mg by mouth daily.    Marland Kitchen azelastine (ASTELIN) 0.1 % nasal spray Place 2 sprays into both nostrils 2 (two) times daily. Use in each nostril as directed 30 mL 2  . buPROPion (WELLBUTRIN XL) 300 MG 24 hr tablet Take 1 tablet (300 mg total) by mouth daily. 90 tablet 1  . carboxymethylcellulose (REFRESH PLUS) 0.5 % SOLN Place 1 drop into both eyes every 2 (two) hours.    . cetirizine (ZYRTEC) 10 MG tablet Take 10 mg by mouth daily.     . Cholecalciferol 4000 UNITS TABS Take 4,000 Units by mouth daily.    . citalopram (CELEXA) 40 MG tablet Take 1 tablet daily or as directed 90 tablet 1  . cromolyn (OPTICROM) 4 % ophthalmic solution Place 1 drop into both eyes 4 (four) times daily.     . ferrous sulfate 325 (65 FE) MG tablet Take 1 tablet daily or as directed 90 tablet 1  . fluticasone (FLONASE) 50 MCG/ACT nasal spray Place 2 sprays into both nostrils daily. 16 g 0  . fluticasone furoate-vilanterol (BREO ELLIPTA) 100-25 MCG/INH AEPB Inhale 1 puff into the lungs daily. 28 each 0  . gabapentin (NEURONTIN) 300 MG capsule TAKE 1 CAPSULE BY MOUTH AT BEDTIME. 90 capsule 1  . isosorbide mononitrate (IMDUR) 30 MG 24 hr tablet Take 1 tablet (30 mg total) by mouth daily. 90 tablet 3  . levothyroxine (SYNTHROID, LEVOTHROID) 150 MCG tablet Take 1 tablet (150 mcg total) by mouth daily. 90 tablet 1  . loratadine (CLARITIN) 10 MG tablet Take 10 mg by mouth daily.    Marland Kitchen LORazepam (ATIVAN) 0.5 MG tablet Take 1 tablet (0.5 mg total) by mouth every 8 (eight) hours as needed (nausea). 30 tablet 0  . loteprednol (ALREX) 0.2 % SUSP Place 1 drop into both eyes 4 (four) times daily. 1 Bottle PRN  . meloxicam (MOBIC) 15 MG tablet Take 1 tablet (15 mg total) by mouth daily. 90 tablet 3  . midodrine (PROAMATINE) 10 MG tablet TAKE 1 TABLET BY MOUTH 3 TIMES DAILY. 270 tablet 3  . montelukast (SINGULAIR) 10 MG tablet Take 1 tablet (10 mg total) by mouth daily. 30 tablet 2  . nitroGLYCERIN (NITROSTAT) 0.4 MG SL tablet Place 0.4 mg under the tongue every 5 (five) minutes as needed for chest pain. Reported on 03/30/2015    . pantoprazole (PROTONIX) 40 MG tablet TAKE 1 TABLET DAILY FOR ACID REFLUX & INDIGESTION 90 tablet 1  . pilocarpine (SALAGEN) 5 MG tablet Take 1 tablet 3 x / day for dry mouth. 270 tablet 1  . promethazine (PHENERGAN) 25 MG tablet Take 25 mg by mouth.    . ranitidine (ZANTAC) 300 MG tablet Take 1 to 2 tablets daily for heartburn & reflux to allow wean and transition from PPI / pantoprazole 180 tablet 3  . senna (SENOKOT) 8.6 MG tablet Take 1 tablet by mouth at bedtime.    Marland Kitchen omeprazole (PRILOSEC) 40 MG capsule Take 40 mg by mouth.    . traMADol (ULTRAM) 50 MG tablet Take 1 to 2 tablets by  mouth 4 times a day as needed for pain. (Patient not taking: Reported on 10/20/2015) 180 tablet 0   Current Facility-Administered Medications on File Prior to Visit  Medication Dose Route Frequency Provider Last Rate Last Dose  . 0.9 %  sodium chloride infusion   Intravenous Once [employer], PA-C      .  acetaminophen (TYLENOL) tablet 650 mg  650 mg Oral Once Susanne Borders, NP      . sodium chloride 0.9 % injection 10 mL  10 mL Intracatheter PRN Curt Bears, MD   10 mL at 01/05/13 1825  . sodium chloride 0.9 % injection 10 mL  10 mL Intracatheter PRN Curt Bears, MD   10 mL at 06/15/14 1514    Review of Systems Constitutional:   No  weight loss, night sweats,  Fevers, chills,  +fatigue, or  lassitude.  HEENT:   No headaches,  Difficulty swallowing,  Tooth/dental problems, or  Sore throat,                No sneezing, itching, ear ache, nasal congestion, post nasal drip,   CV:  No chest pain,  Orthopnea, PND, swelling in lower extremities, anasarca, dizziness, palpitations, syncope.   GI  No heartburn, indigestion, abdominal pain, nausea, vomiting, diarrhea, change in bowel habits, loss of appetite, bloody stools.   Resp:    No chest wall deformity  Skin: no rash or lesions.  GU: no dysuria, change in color of urine, no urgency or frequency.  No flank pain, no hematuria   MS:  No joint pain or swelling.  No decreased range of motion.  No back pain.  Psych:  No change in mood or affect. No depression or anxiety.  No memory loss.         Objective:   Physical Exam Vitals:   10/20/15 1502  BP: 110/62  Pulse: 71  Temp: 98.2 F (36.8 C)  SpO2: 99%  Weight: 156 lb 3.2 oz (70.9 kg)  Height: _0  (1.651 m)   GEN: A/Ox3; pleasant , NAD, chronically ill appearing    HEENT:  [place]/AT,  EACs-clear, TMs-wnl, NOSE-clear, THROAT-clear, no lesions, no postnasal drip or exudate noted.   NECK:  Supple w/ fair ROM; no JVD; normal carotid impulses w/o bruits; no thyromegaly  or nodules palpated; no lymphadenopathy.    RESP  Decreased BS in bases w/o, wheezes/ rales/ or rhonchi. no accessory muscle use, no dullness to percussion  CARD:  RRR, no m/r/g  , tr peripheral edema, pulses intact, no cyanosis or clubbing. Left arm fistula   GI:   Soft & nt; nml bowel sounds; no organomegaly or masses detected.   Musco: Warm bil, no deformities or joint swelling noted.   Neuro: alert, no focal deficits noted.    Skin: Warm, no lesions or rashes  Analiah Drum NP-C   Pulmonary and Critical Care  10/20/2015        Assessment & Plan:

## 2015-10-20 NOTE — Assessment & Plan Note (Signed)
Left sided lung mass /PET positive suspcious for cancer - possible primary lung cancer. Hx of breast cancer on left.  Cont w/ CT guided bx tomorrow.

## 2015-10-20 NOTE — Assessment & Plan Note (Signed)
Progressive DOE over last 2 months , ? Etiology  Mild desats with quick recovery , check D Dimer if elevated, check VQ scan  Venous doppler pending .  Check bnp .   Plan  Patient Instructions  Continue on BREO 1 puff daily  Add Spiriva Respimat 2 puffs daily  Go for CT lung biopsy as planned tomorrow.  follow up Dr. Annamaria Boots  In 2-3 months and As needed   Please contact office for sooner follow up if symptoms do not improve or worsen or seek emergency care

## 2015-10-20 NOTE — Patient Instructions (Signed)
Continue on BREO 1 puff daily  Add Spiriva Respimat 2 puffs daily  Go for CT lung biopsy as planned tomorrow.  follow up Dr. Annamaria Boots  In 2-3 months and As needed   Please contact office for sooner follow up if symptoms do not improve or worsen or seek emergency care

## 2015-10-20 NOTE — Assessment & Plan Note (Signed)
Possible Mild flare but suspect this is  multifactoral with ongoing chemo, probable lung cancer .  Will add Spiriva Check D Dimer and Ven doppler as at risk for hypercoagulable state . Check BNP   Plan  Patient Instructions  Continue on BREO 1 puff daily  Add Spiriva Respimat 2 puffs daily  Go for CT lung biopsy as planned tomorrow.  follow up Dr. Annamaria Boots  In 2-3 months and As needed   Please contact office for sooner follow up if symptoms do not improve or worsen or seek emergency care

## 2015-10-21 ENCOUNTER — Ambulatory Visit (HOSPITAL_COMMUNITY)
Admission: RE | Admit: 2015-10-21 | Discharge: 2015-10-21 | Disposition: A | Discharge status: Home / Self Care | Payer: Medicare Other | Source: Ambulatory Visit | Attending: Interventional Radiology | Admitting: Interventional Radiology

## 2015-10-21 ENCOUNTER — Observation Stay (HOSPITAL_COMMUNITY)
Admission: RE | Admit: 2015-10-21 | Discharge: 2015-10-22 | Disposition: A | Discharge status: Home / Self Care | Payer: Medicare Other | Source: Ambulatory Visit | Attending: Interventional Radiology | Admitting: Interventional Radiology

## 2015-10-21 ENCOUNTER — Encounter (HOSPITAL_COMMUNITY): Payer: Self-pay

## 2015-10-21 DIAGNOSIS — Z9012 Acquired absence of left breast and nipple: Secondary | ICD-10-CM | POA: Diagnosis not present

## 2015-10-21 DIAGNOSIS — F329 Major depressive disorder, single episode, unspecified: Secondary | ICD-10-CM | POA: Diagnosis not present

## 2015-10-21 DIAGNOSIS — E039 Hypothyroidism, unspecified: Secondary | ICD-10-CM | POA: Diagnosis not present

## 2015-10-21 DIAGNOSIS — N183 Chronic kidney disease, stage 3 (moderate): Secondary | ICD-10-CM | POA: Diagnosis not present

## 2015-10-21 DIAGNOSIS — B450 Pulmonary cryptococcosis: Secondary | ICD-10-CM | POA: Diagnosis not present

## 2015-10-21 DIAGNOSIS — C9000 Multiple myeloma not having achieved remission: Secondary | ICD-10-CM | POA: Insufficient documentation

## 2015-10-21 DIAGNOSIS — Z79899 Other long term (current) drug therapy: Secondary | ICD-10-CM | POA: Diagnosis not present

## 2015-10-21 DIAGNOSIS — I700 Atherosclerosis of aorta: Secondary | ICD-10-CM | POA: Diagnosis not present

## 2015-10-21 DIAGNOSIS — J449 Chronic obstructive pulmonary disease, unspecified: Secondary | ICD-10-CM | POA: Diagnosis not present

## 2015-10-21 DIAGNOSIS — R918 Other nonspecific abnormal finding of lung field: Secondary | ICD-10-CM | POA: Diagnosis not present

## 2015-10-21 DIAGNOSIS — Z853 Personal history of malignant neoplasm of breast: Secondary | ICD-10-CM | POA: Diagnosis not present

## 2015-10-21 DIAGNOSIS — F419 Anxiety disorder, unspecified: Secondary | ICD-10-CM | POA: Diagnosis not present

## 2015-10-21 DIAGNOSIS — G629 Polyneuropathy, unspecified: Secondary | ICD-10-CM | POA: Diagnosis not present

## 2015-10-21 DIAGNOSIS — R042 Hemoptysis: Secondary | ICD-10-CM | POA: Insufficient documentation

## 2015-10-21 DIAGNOSIS — D3502 Benign neoplasm of left adrenal gland: Secondary | ICD-10-CM | POA: Diagnosis not present

## 2015-10-21 DIAGNOSIS — K219 Gastro-esophageal reflux disease without esophagitis: Secondary | ICD-10-CM | POA: Insufficient documentation

## 2015-10-21 DIAGNOSIS — M797 Fibromyalgia: Secondary | ICD-10-CM | POA: Diagnosis not present

## 2015-10-21 DIAGNOSIS — I252 Old myocardial infarction: Secondary | ICD-10-CM | POA: Insufficient documentation

## 2015-10-21 DIAGNOSIS — R079 Chest pain, unspecified: Secondary | ICD-10-CM | POA: Diagnosis not present

## 2015-10-21 HISTORY — DX: Hemoptysis: R04.2

## 2015-10-21 HISTORY — PX: LUNG BIOPSY: SHX5088

## 2015-10-21 HISTORY — DX: Cardiac murmur, unspecified: R01.1

## 2015-10-21 HISTORY — DX: Dyspnea, unspecified: R06.00

## 2015-10-21 LAB — CBC
HCT: 30.7 % — ABNORMAL LOW (ref 36.0–46.0)
Hemoglobin: 9.7 g/dL — ABNORMAL LOW (ref 12.0–15.0)
MCH: 34.4 pg — AB (ref 26.0–34.0)
MCHC: 31.6 g/dL (ref 30.0–36.0)
MCV: 108.9 fL — AB (ref 78.0–100.0)
PLATELETS: 69 10*3/uL — AB (ref 150–400)
RBC: 2.82 MIL/uL — ABNORMAL LOW (ref 3.87–5.11)
RDW: 17.8 % — AB (ref 11.5–15.5)
WBC: 5.6 10*3/uL (ref 4.0–10.5)

## 2015-10-21 LAB — PROTIME-INR
INR: 0.89
Prothrombin Time: 12.1 seconds (ref 11.4–15.2)

## 2015-10-21 LAB — APTT: aPTT: 26 seconds (ref 24–36)

## 2015-10-21 LAB — D-DIMER, QUANTITATIVE: D-Dimer, Quant: 0.59 ug{FEU}/mL — ABNORMAL HIGH

## 2015-10-21 MED ORDER — VITAMIN C 250 MG PO TABS
250.0000 mg | ORAL_TABLET | Freq: Every day | ORAL | Status: DC
  Filled 2015-10-21: qty 1

## 2015-10-21 MED ORDER — ACETAMINOPHEN 325 MG PO TABS
ORAL_TABLET | ORAL | Status: AC
  Filled 2015-10-21: qty 2

## 2015-10-21 MED ORDER — VITAMIN D3 25 MCG (1000 UNIT) PO TABS
4000.0000 [IU] | ORAL_TABLET | Freq: Every day | ORAL | Status: DC
  Filled 2015-10-21: qty 4

## 2015-10-21 MED ORDER — SODIUM CHLORIDE 0.9 % IV SOLN
20.0000 ug | INTRAVENOUS | Status: AC
  Administered 2015-10-21: 20 ug via INTRAVENOUS
  Filled 2015-10-21: qty 5

## 2015-10-21 MED ORDER — UMECLIDINIUM-VILANTEROL 62.5-25 MCG/INH IN AEPB: 1.0000 | INHALATION_SPRAY | Freq: Every day | RESPIRATORY_TRACT | Status: DC

## 2015-10-21 MED ORDER — FENTANYL CITRATE (PF) 100 MCG/2ML IJ SOLN
INTRAMUSCULAR | Status: AC
  Filled 2015-10-21: qty 4

## 2015-10-21 MED ORDER — MIDAZOLAM HCL 2 MG/2ML IJ SOLN
INTRAMUSCULAR | Status: AC
  Filled 2015-10-21: qty 4

## 2015-10-21 MED ORDER — CROMOLYN SODIUM 4 % OP SOLN
1.0000 [drp] | Freq: Four times a day (QID) | OPHTHALMIC | Status: DC
  Filled 2015-10-21: qty 10

## 2015-10-21 MED ORDER — MONTELUKAST SODIUM 10 MG PO TABS: 10.0000 mg | ORAL_TABLET | Freq: Every day | ORAL | Status: DC

## 2015-10-21 MED ORDER — CARBOXYMETHYLCELLULOSE SODIUM 0.5 % OP SOLN: 1.0000 [drp] | OPHTHALMIC | Status: DC

## 2015-10-21 MED ORDER — PILOCARPINE HCL 5 MG PO TABS
5.0000 mg | ORAL_TABLET | Freq: Two times a day (BID) | ORAL | Status: DC
  Administered 2015-10-21: 5 mg via ORAL
  Filled 2015-10-21 (×2): qty 1

## 2015-10-21 MED ORDER — ISOSORBIDE MONONITRATE ER 30 MG PO TB24: 30.0000 mg | ORAL_TABLET | Freq: Every day | ORAL | Status: DC

## 2015-10-21 MED ORDER — LEVOTHYROXINE SODIUM 150 MCG PO TABS
150.0000 ug | ORAL_TABLET | Freq: Every day | ORAL | Status: DC
  Administered 2015-10-22: 150 ug via ORAL
  Filled 2015-10-21: qty 2
  Filled 2015-10-21: qty 1

## 2015-10-21 MED ORDER — MIDAZOLAM HCL 2 MG/2ML IJ SOLN
INTRAMUSCULAR | Status: AC | PRN
  Administered 2015-10-21: 1 mg via INTRAVENOUS

## 2015-10-21 MED ORDER — MELOXICAM 7.5 MG PO TABS
15.0000 mg | ORAL_TABLET | Freq: Every day | ORAL | Status: DC
  Filled 2015-10-21: qty 1

## 2015-10-21 MED ORDER — FENTANYL CITRATE (PF) 100 MCG/2ML IJ SOLN
INTRAMUSCULAR | Status: AC | PRN
  Administered 2015-10-21 (×2): 25 ug via INTRAVENOUS

## 2015-10-21 MED ORDER — LIDOCAINE HCL 1 % IJ SOLN
INTRAMUSCULAR | Status: AC
  Filled 2015-10-21: qty 20

## 2015-10-21 MED ORDER — SODIUM CHLORIDE 0.9 % IV SOLN
INTRAVENOUS | Status: DC
  Administered 2015-10-21 (×2): via INTRAVENOUS

## 2015-10-21 MED ORDER — LOTEPREDNOL ETABONATE 0.5 % OP SUSP
1.0000 [drp] | Freq: Four times a day (QID) | OPHTHALMIC | Status: DC
  Administered 2015-10-21: 1 [drp] via OPHTHALMIC
  Filled 2015-10-21: qty 5

## 2015-10-21 MED ORDER — LORAZEPAM 0.5 MG PO TABS
0.5000 mg | ORAL_TABLET | Freq: Three times a day (TID) | ORAL | Status: DC | PRN
  Administered 2015-10-21: 0.5 mg via ORAL
  Filled 2015-10-21: qty 1

## 2015-10-21 MED ORDER — NITROGLYCERIN 0.4 MG SL SUBL: 0.4000 mg | SUBLINGUAL_TABLET | SUBLINGUAL | Status: DC | PRN

## 2015-10-21 MED ORDER — FERROUS SULFATE 325 (65 FE) MG PO TABS
325.0000 mg | ORAL_TABLET | Freq: Every day | ORAL | Status: DC
  Administered 2015-10-22: 325 mg via ORAL
  Filled 2015-10-21: qty 1

## 2015-10-21 MED ORDER — FLUTICASONE FUROATE-VILANTEROL 100-25 MCG/INH IN AEPB
1.0000 | INHALATION_SPRAY | Freq: Every day | RESPIRATORY_TRACT | Status: DC
  Administered 2015-10-22: 1 via RESPIRATORY_TRACT
  Filled 2015-10-21: qty 28

## 2015-10-21 MED ORDER — GABAPENTIN 300 MG PO CAPS
300.0000 mg | ORAL_CAPSULE | Freq: Every day | ORAL | Status: DC
  Administered 2015-10-21: 300 mg via ORAL
  Filled 2015-10-21: qty 1

## 2015-10-21 MED ORDER — TIOTROPIUM BROMIDE MONOHYDRATE 2.5 MCG/ACT IN AERS: 2.0000 | INHALATION_SPRAY | Freq: Every day | RESPIRATORY_TRACT | Status: DC

## 2015-10-21 MED ORDER — BUPROPION HCL ER (XL) 300 MG PO TB24
300.0000 mg | ORAL_TABLET | Freq: Every day | ORAL | Status: DC
  Filled 2015-10-21: qty 1

## 2015-10-21 MED ORDER — MIDODRINE HCL 5 MG PO TABS: 10.0000 mg | ORAL_TABLET | Freq: Every day | ORAL | Status: DC

## 2015-10-21 MED ORDER — LORATADINE 10 MG PO TABS: 10.0000 mg | ORAL_TABLET | Freq: Every day | ORAL | Status: DC

## 2015-10-21 MED ORDER — CITALOPRAM HYDROBROMIDE 40 MG PO TABS
40.0000 mg | ORAL_TABLET | Freq: Every day | ORAL | Status: DC
  Filled 2015-10-21: qty 1

## 2015-10-21 MED ORDER — ALBUTEROL SULFATE (2.5 MG/3ML) 0.083% IN NEBU: 3.0000 mL | INHALATION_SOLUTION | Freq: Four times a day (QID) | RESPIRATORY_TRACT | Status: DC | PRN

## 2015-10-21 MED ORDER — SENNA 8.6 MG PO TABS
1.0000 | ORAL_TABLET | Freq: Every day | ORAL | Status: DC
  Filled 2015-10-21: qty 1

## 2015-10-21 MED ORDER — FLUTICASONE PROPIONATE 50 MCG/ACT NA SUSP
2.0000 | Freq: Every day | NASAL | Status: DC
  Filled 2015-10-21: qty 16

## 2015-10-21 MED ORDER — PANTOPRAZOLE SODIUM 40 MG PO TBEC: 40.0000 mg | DELAYED_RELEASE_TABLET | Freq: Every day | ORAL | Status: DC

## 2015-10-21 MED ORDER — AZELASTINE HCL 0.1 % NA SOLN
2.0000 | Freq: Two times a day (BID) | NASAL | Status: DC
  Administered 2015-10-21: 2 via NASAL
  Filled 2015-10-21: qty 30

## 2015-10-21 MED ORDER — FAMOTIDINE 10 MG PO TABS: 10.0000 mg | ORAL_TABLET | Freq: Every day | ORAL | Status: DC

## 2015-10-21 MED ORDER — ACETAMINOPHEN 325 MG PO TABS
650.0000 mg | ORAL_TABLET | Freq: Four times a day (QID) | ORAL | Status: DC | PRN
Start: 2015-10-21 — End: 2015-10-22
  Administered 2015-10-21 (×2): 650 mg via ORAL
  Filled 2015-10-21: qty 2

## 2015-10-21 MED ORDER — SODIUM CHLORIDE 0.9 % IV SOLN
INTRAVENOUS | Status: DC
  Administered 2015-10-21: 10:00:00 via INTRAVENOUS

## 2015-10-21 NOTE — Sedation Documentation (Signed)
MD at bedside. 

## 2015-10-21 NOTE — Sedation Documentation (Signed)
Pt has had active hemoptysis for 10 minutes at this time. Dr Kathlene Cote at bedside

## 2015-10-21 NOTE — Progress Notes (Signed)
ltmcb x1

## 2015-10-21 NOTE — Sedation Documentation (Signed)
Pt continues to have hemoptysis, suctioning pt at this time

## 2015-10-21 NOTE — Sedation Documentation (Signed)
Vital signs stable. No complaints from pt

## 2015-10-21 NOTE — Progress Notes (Signed)
Arrived to 6n14 from radiology, oriented to room and surroundings

## 2015-10-21 NOTE — Sedation Documentation (Signed)
C/O headache and some chest soreness 5 on scale of 1-10.  Spoke with Dr Kathlene Cote, ok to give clear liquids at this point.  Awaiting med surg bed.

## 2015-10-21 NOTE — Progress Notes (Signed)
Patient ID: Nancy Norris, female   DOB: Feb 13, 1940, 75 y.o.   MRN: [id number]  Interventional Radiology  VSS Doing well; no further hemoptysis.  No pain or SOB. Will advance to regular diet.  May get OOB to chair and BR. Wean O2. CXR in AM; likely discharge tomorrow AM.  Eulas Post T. Kathlene Cote, M.D Pager:  [phone]

## 2015-10-21 NOTE — Sedation Documentation (Signed)
Pt is very anxious and crying.

## 2015-10-21 NOTE — Sedation Documentation (Signed)
Patient continues to have hemoptysis and coughing

## 2015-10-21 NOTE — Sedation Documentation (Signed)
Pt remains stable at this time. Pt is supine at this time with no coughing or hemoptysis. Vitals stable. Pt complains of headache

## 2015-10-21 NOTE — Sedation Documentation (Signed)
Vital signs stable. MD at beside. Pt continues to cough, no hemoptysis at the present time

## 2015-10-21 NOTE — H&P (Signed)
Chief Complaint: Patient was seen in consultation today for left lung mass biopsy at the request of [facility]  Referring Physician(s): [name],[name]  Supervising Physician: Aletta Edouard  Patient Status: Outpatient  History of Present Illness: Nancy Norris is a 75 y.o. female   Hx Breast Ca Hx Multiple Myeloma Developed deep cough and shortness of breath few months ago While in [facility] for [facility] treatment ([facility])--CXR was performed 08/23/15 Revealed pneumonia and LUL nodule. Followed by Dr Julien Nordmann CT confirmed persistent finding PET 9/26: IMPRESSION: 4.2 cm hypermetabolic of mass within the lingula, highly suspicious for primary bronchogenic carcinoma. No evidence of thoracic lymph node or distant metastatic disease. Stable benign left adrenal adenoma  Now scheduled for Left lung mass biopsy  Past Medical History:  Diagnosis Date  . Anemia   . Anginal pain ([place])    used NTG x 2 May 31 and 06/15/13   . Anxiety   . Arthritis   . B12 deficiency 12/04/2014  . Breast cancer ([place])   . CKD (chronic kidney disease) stage 3, GFR 30-59 ml/min   . Complication of anesthesia   . COPD (chronic obstructive pulmonary disease) ([place])   . Depression   . Dizziness   . Fibromyalgia   . Fibromyalgia   . GERD (gastroesophageal reflux disease)   . Headache(784.0)   . History of blood transfusion    last one May 12   . Hx of cardiovascular stress test    LexiScan with low level exercise Myoview (02/2013): No ischemia, EF 72%; normal study  . Hx of echocardiogram    a.  Echocardiogram (12/26/2012): EF 19-75%, grade 1 diastolic dysfunction;   b.  Echocardiogram (02/2013): EF 55-60%, no WMA, trivial effusion  . Hyperkalemia   . Hyponatremia   . Hypothyroidism   . Mucositis   . Multiple myeloma   . Myocardial infarction    in past, patient was unaware.   . Neuropathy ([place])   . Pain in joint, pelvic region and thigh 07/07/2015  . PONV (postoperative nausea and vomiting) 2008     after mastestomy    Past Surgical History:  Procedure Laterality Date  . ABDOMINAL HYSTERECTOMY  1981  . AV FISTULA PLACEMENT Left 06/19/2013   Procedure: CREATION OF LEFT ARM ARTERIOVENOUS (AV) FISTULA ;  Surgeon: Angelia Mould, MD;  Location: [facility];  Service: Vascular;  Laterality: Left;  . BREAST RECONSTRUCTION    . BREAST SURGERY    . CATARACT EXTRACTION, BILATERAL    . CHOLECYSTECTOMY  1971  . EYE SURGERY Bilateral    lens implant  . history of Port removal    . MASTECTOMY Left 2008  . PORTACATH PLACEMENT  12/2012   has had 2  . Status post stem cell transplant on September 28, 2008.      Allergies: Codeine; Latex; Other; Onion; Zyprexa [olanzapine]; Adhesive [tape]; Hydrocodone; Iodinated diagnostic agents; and Sulfa antibiotics  Medications: Prior to Admission medications   Medication Sig Start Date End Date Taking? Authorizing Provider  acetaminophen (TYLENOL) 325 MG tablet Take 650 mg by mouth every 6 (six) hours as needed for mild pain or moderate pain.    Yes Historical Provider, MD  albuterol (PROVENTIL HFA;VENTOLIN HFA) 108 (90 Base) MCG/ACT inhaler Inhale 1-2 puffs into the lungs every 6 (six) hours as needed for wheezing or shortness of breath (cough). 08/30/15  Yes Courtney Forcucci, PA-C  ANORO ELLIPTA 62.5-25 MCG/INH AEPB Inhale 1 puff into the lungs daily.  02/25/15  Yes Historical Provider,  MD  ascorbic acid (VITAMIN C) 250 MG CHEW Chew 250 mg by mouth daily.   Yes Historical Provider, MD  azelastine (ASTELIN) 0.1 % nasal spray Place 2 sprays into both nostrils 2 (two) times daily. Use in each nostril as directed 02/25/15 02/25/16 Yes Courtney Forcucci, PA-C  buPROPion (WELLBUTRIN XL) 300 MG 24 hr tablet Take 1 tablet (300 mg total) by mouth daily. 07/22/15  Yes [name], MD  carboxymethylcellulose (REFRESH PLUS) 0.5 % SOLN Place 1 drop into both eyes every 2 (two) hours.   Yes Historical Provider, MD  cetirizine (ZYRTEC) 10 MG tablet Take 10 mg by  mouth daily.  03/29/09  Yes Historical Provider, MD  Cholecalciferol 4000 UNITS TABS Take 4,000 Units by mouth daily.   Yes Historical Provider, MD  citalopram (CELEXA) 40 MG tablet Take 1 tablet daily or as directed 07/22/15 01/21/16 Yes [name], MD  cromolyn (OPTICROM) 4 % ophthalmic solution Place 1 drop into both eyes 4 (four) times daily.   Yes Historical Provider, MD  ferrous sulfate 325 (65 FE) MG tablet Take 1 tablet daily or as directed 07/22/15 01/21/16 Yes [name], MD  fluticasone [facility]) 50 MCG/ACT nasal spray Place 2 sprays into both nostrils daily. 10/18/15  Yes Courtney Forcucci, PA-C  fluticasone furoate-vilanterol (BREO ELLIPTA) 100-25 MCG/INH AEPB Inhale 1 puff into the lungs daily. 08/30/15  Yes Courtney Forcucci, PA-C  gabapentin (NEURONTIN) 300 MG capsule TAKE 1 CAPSULE BY MOUTH AT BEDTIME. 07/22/15  Yes [name], MD  isosorbide mononitrate (IMDUR) 30 MG 24 hr tablet Take 1 tablet (30 mg total) by mouth daily. 07/21/15  Yes [name], MD  levothyroxine (SYNTHROID, LEVOTHROID) 150 MCG tablet Take 1 tablet (150 mcg total) by mouth daily. 07/22/15  Yes [name], MD  loratadine (CLARITIN) 10 MG tablet Take 10 mg by mouth daily.   Yes Historical Provider, MD  LORazepam (ATIVAN) 0.5 MG tablet Take 1 tablet (0.5 mg total) by mouth every 8 (eight) hours as needed (nausea). 08/18/14  Yes Curt Bears, MD  loteprednol (ALREX) 0.2 % SUSP Place 1 drop into both eyes 4 (four) times daily. 12/08/13  Yes Vicie Mutters, PA-C  meloxicam (MOBIC) 15 MG tablet Take 1 tablet (15 mg total) by mouth daily. 07/21/15  Yes [name], MD  midodrine (PROAMATINE) 10 MG tablet TAKE 1 TABLET BY MOUTH 3 TIMES DAILY. 07/21/15  Yes [name], MD  montelukast (SINGULAIR) 10 MG tablet Take 1 tablet (10 mg total) by mouth daily. 10/18/15 10/17/16 Yes Courtney Forcucci, PA-C  nitroGLYCERIN (NITROSTAT) 0.4 MG SL tablet Place 0.4 mg under the tongue every 5 (five) minutes as needed for  chest pain. Reported on 03/30/2015   Yes Historical Provider, MD  omeprazole (PRILOSEC) 40 MG capsule Take 40 mg by mouth. 09/28/08  Yes Historical Provider, MD  pantoprazole (PROTONIX) 40 MG tablet TAKE 1 TABLET DAILY FOR ACID REFLUX & INDIGESTION 09/07/15  Yes Vicie Mutters, PA-C  pilocarpine (SALAGEN) 5 MG tablet Take 1 tablet 3 x / day for dry mouth. 07/22/15 01/22/16 Yes [name], MD  promethazine (PHENERGAN) 25 MG tablet Take 25 mg by mouth. 10/14/09  Yes Historical Provider, MD  ranitidine (ZANTAC) 300 MG tablet Take 1 to 2 tablets daily for heartburn & reflux to allow wean and transition from PPI / pantoprazole 07/21/15  Yes [name], MD  senna (SENOKOT) 8.6 MG tablet Take 1 tablet by mouth at bedtime.   Yes Historical Provider, MD  traMADol (ULTRAM) 50 MG tablet Take 1 to  2 tablets by mouth 4 times a day as needed for pain. Patient not taking: Reported on 10/20/2015 07/22/15  Yes [name], MD  Tiotropium Bromide Monohydrate (SPIRIVA RESPIMAT) 2.5 MCG/ACT AERS Inhale 2 puffs into the lungs daily. 10/20/15   Melvenia Needles, NP     Family History  Problem Relation Age of Onset  . Arthritis Mother   . Asthma Mother   . Cancer Sister   . Hyperlipidemia Brother     Social History   Social History  . Marital status: Single    Spouse name: N/A  . Number of children: 1  . Years of education: N/A   Occupational History  . retired-sect.Licensed conveyancer)    Social History Main Topics  . Smoking status: Former Smoker    Packs/day: 1.00    Years: 30.00    Types: Cigarettes    Quit date: 02/15/2005  . Smokeless tobacco: Never Used  . Alcohol use No  . Drug use: No  . Sexual activity: No   Other Topics Concern  . None   Social History Narrative  . None    Review of Systems: A 12 point ROS discussed and pertinent positives are indicated in the HPI above.  All other systems are negative.  Review of Systems  Constitutional: Negative for activity change, fatigue and fever.   Respiratory: Positive for cough. Negative for shortness of breath.   Cardiovascular: Negative for chest pain.  Gastrointestinal: Negative for abdominal pain.  Neurological: Positive for weakness.  Psychiatric/Behavioral: Negative for behavioral problems and confusion.    Vital Signs: BP (!) 146/57   Pulse (!) 57   Temp 97.7 F (36.5 C) (Oral)   Resp 16   Ht _0  (1.651 m)   Wt 153 lb (69.4 kg)   SpO2 100%   BMI 25.46 kg/m   Physical Exam  Constitutional: She is oriented to person, place, and time.  Cardiovascular: Normal rate and regular rhythm.   Pulmonary/Chest: Effort normal and breath sounds normal.  Abdominal: Soft. Bowel sounds are normal.  Musculoskeletal: Normal range of motion.  Neurological: She is alert and oriented to person, place, and time.  Skin: Skin is warm and dry.  Psychiatric: She has a normal mood and affect. Her behavior is normal. Judgment and thought content normal.  Nursing note and vitals reviewed.   Mallampati Score:  MD Evaluation Airway: WNL Heart: WNL Abdomen: WNL Chest/ Lungs: WNL ASA  Classification: 3 Mallampati/Airway Score: One  Imaging: Ct Chest Wo Contrast  Result Date: 09/26/2015 CLINICAL DATA:  History of breast cancer and multiple myeloma. Lung nodule seen on recent chest x-ray. EXAM: CT CHEST WITHOUT CONTRAST TECHNIQUE: Multidetector CT imaging of the chest was performed following the standard protocol without IV contrast. COMPARISON:  09/13/2015 FINDINGS: Cardiovascular: The heart size is normal. No pericardial effusion. Coronary artery calcification is noted. Atherosclerotic calcification is noted in the wall of the thoracic aorta. Right Port-A-Cath tip is positioned at the SVC/ RA junction. Mediastinum/Nodes: Scattered small lymph nodes identified in the mediastinum without lymphadenopathy. No evidence for gross hilar lymphadenopathy although assessment is limited by the lack of intravenous contrast on today's study. Small  hiatal hernia noted. Esophagus otherwise unremarkable. There is no axillary lymphadenopathy. Lungs/Pleura: 3.0 x 3.8 cm posterior lingular mass highly suspicious for neoplasm. Centrilobular emphysema noted in the upper lungs bilaterally. Small region of tree in bud peribronchovascular nodularity in the right upper lobe likely related to prior or ongoing atypical infection. No pulmonary edema. No pleural effusion.  Upper Abdomen: Abdominal aortic atherosclerosis. Musculoskeletal: Bone windows reveal no worrisome lytic or sclerotic osseous lesions. IMPRESSION: 1. 3.0 x 3.8 cm left lung mass persistent since 08/23/2015 and new since 12/02/2014. Given the well-defined round configuration, neoplasm is a distinct concern. No evidence for bulky left hilar or mediastinal lymphadenopathy. PET-CT recommended to further evaluate. 2. Emphysema. 3. Abdominal aortic atherosclerosis. Electronically Signed   By: Misty Stanley M.D.   On: 09/26/2015 13:49   Nm Pet Image Initial (pi) Skull Base To Thigh  Result Date: 10/11/2015 CLINICAL DATA:  Initial treatment strategy for left lung mass. Personal history of breast carcinoma. EXAM: NUCLEAR MEDICINE PET SKULL BASE TO THIGH TECHNIQUE: 8.0 mCi F-18 FDG was injected intravenously. Full-ring PET imaging was performed from the skull base to thigh after the radiotracer. CT data was obtained and used for attenuation correction and anatomic localization. FASTING BLOOD GLUCOSE:  Value: 118 mg/dl COMPARISON:  Chest CT on 09/26/2015 FINDINGS: NECK No hypermetabolic lymph nodes in the neck. Hypermetabolic mucosal thickening is noted within the right maxillary sinus, consistent with sinusitis. CHEST No hypermetabolic mediastinal or hilar nodes. Previous left mastectomy and axillary lymph node dissection. No evidence of hypermetabolic axillary lymph nodes. 4.2 x 3.8 cm ill-defined mass is seen in the posterior lingula on image 37/8 which is further increase in size compared to previous study  when it measured 3.8 x 3.0 cm. This has SUV max of 19.7. No other suspicious pulmonary nodules or masses seen on CT images. Mild emphysema noted. No evidence of pleural effusion. ABDOMEN/PELVIS No abnormal hypermetabolic activity within the liver, pancreas, adrenal glands, or spleen. 1.6 cm left adrenal mass is stable compared to previous abdomen CT on 11/23/2013, and shows absence of FDG activity previous consistent with a benign adrenal adenoma. No hypermetabolic lymph nodes in the abdomen or pelvis. Aortic atherosclerosis. Mild diverticulosis is seen involving the sigmoid colon, without evidence of diverticulitis. SKELETON No focal hypermetabolic activity to suggest skeletal metastasis. IMPRESSION: 4.2 cm hypermetabolic of mass within the lingula, highly suspicious for primary bronchogenic carcinoma. No evidence of thoracic lymph node or distant metastatic disease. Stable benign left adrenal adenoma. Electronically Signed   By: Earle Gell M.D.   On: 10/11/2015 13:43    Labs:  CBC:  Recent Labs  09/28/15 1258 10/05/15 1300 10/12/15 0944 10/21/15 0928  WBC 9.6 6.4 5.2 5.6  HGB 7.6* 11.2* 10.2* 9.7*  HCT 23.6* 33.8* 31.2* 30.7*  PLT 150 80* 69* PENDING    COAGS:  Recent Labs  10/21/15 0928  INR 0.89  APTT 26    BMP:  Recent Labs  12/05/14 0645 12/06/14 0533 12/07/14 0515  05/27/15 1139  09/28/15 1258 10/05/15 1300 10/12/15 0945 10/20/15 1647  NA 139 141 141  < > 142  < > 140 141 140 142  K 3.8 3.7 3.8  < > 3.7  < > 3.9 3.7 4.6 4.1  CL 111 114* 115*  --  113*  --   --   --   --  109  CO2 17* 19* 19*  < > 18*  < > 21* 21* 21* 25  GLUCOSE 85 102* 105*  < > 104*  < > 94 113 93 114*  BUN 36* 25* 24*  < > 34*  < > 43.4* 52.5* 43.7* 32*  CALCIUM 8.2* 8.0* 8.4*  < > 9.1  < > 9.4 10.5* 9.6 9.3  CREATININE 3.09* 2.93* 2.74*  < > 3.69*  < > 3.1* 3.4* 2.9* 2.84*  GFRNONAA 14*  15* 16*  --  12*  --   --   --   --   --   GFRAA 16* 17* 19*  --  13*  --   --   --   --   --   < > =  values in this interval not displayed.  LIVER FUNCTION TESTS:  Recent Labs  08/30/15 1120 09/28/15 1258 10/05/15 1300 10/12/15 0945  BILITOT 1.34* 0.67 0.60 0.72  AST _0 ALT 20 <_1 ALKPHOS 109 95 98 104  PROT 6.1* 6.7 6.9 6.2*  ALBUMIN 3.6 3.4* 3.3* 3.1*    TUMOR MARKERS: No results for input(s): AFPTM, CEA, CA199, CHROMGRNA in the last 8760 hours.  Assessment and Plan:  Hx Breast Ca Hx Multiple Myeloma New Left lung mass noted; +PET Now scheduled for Left lung mass biopsy Risks and Benefits discussed with the patient including, but not limited to bleeding, hemoptysis, respiratory failure requiring intubation, infection, pneumothorax requiring chest tube placement, stroke from air embolism or even death. All of the patient's questions were answered, patient is agreeable to proceed. Consent signed and in chart.   Thank you for this interesting consult.  I greatly enjoyed meeting Nancy Norris and look forward to participating in their care.  A copy of this report was sent to the requesting provider on this date.  Electronically Signed: Jaelan Rasheed A 10/21/2015, 10:13 AM   I spent a total of  30 Minutes   in face to face in clinical consultation, greater than 50% of which was counseling/coordinating care for left lung mass bx

## 2015-10-21 NOTE — Sedation Documentation (Signed)
Vital signs stable. 

## 2015-10-21 NOTE — Procedures (Signed)
Interventional Radiology Procedure Note  Procedure:  CT guided core biopsy of LUL/lingular lung mass  Complications:  Hemoptysis  Estimated Blood Loss: 10-15 mL  CT guided 18 G core biopsy of 4 cm lingular mass via 17 G needle.  Immediate cough and gross hemoptysis after one core sample. Procedure aborted; patient placed left side down and suctioned. Currently much less hemoptysis.  Will get STAT CXR and admit for observation.  Venetia Night. Kathlene Cote, M.D Pager:  [phone]

## 2015-10-21 NOTE — Sedation Documentation (Signed)
Pt coughing up blood

## 2015-10-21 NOTE — Progress Notes (Signed)
Pt has oral temp of 101.4. IS encourage can reach 1750. Tylenol given. Dr. Earleen Newport made aware. No new order given. Will continue to monitor pt.

## 2015-10-21 NOTE — Sedation Documentation (Signed)
Patient is resting comfortably. 

## 2015-10-21 NOTE — Progress Notes (Signed)
Pt remain stable at this time. Pt drinking gingerale and tolerating well. Sister at bedside. No complaints of pain at this time. Awaiting bed assignment. Vitals stable

## 2015-10-21 NOTE — Sedation Documentation (Signed)
Transported pt to radiology nurses station. Pt remains stable at this time, denies pain

## 2015-10-21 NOTE — Sedation Documentation (Signed)
Hemoptysis after biopsy taken

## 2015-10-21 NOTE — Sedation Documentation (Signed)
Patient is resting. Vitals stable.

## 2015-10-22 ENCOUNTER — Observation Stay (HOSPITAL_COMMUNITY): Payer: Medicare Other

## 2015-10-22 DIAGNOSIS — N183 Chronic kidney disease, stage 3 (moderate): Secondary | ICD-10-CM | POA: Diagnosis not present

## 2015-10-22 DIAGNOSIS — B450 Pulmonary cryptococcosis: Secondary | ICD-10-CM | POA: Diagnosis not present

## 2015-10-22 DIAGNOSIS — D3502 Benign neoplasm of left adrenal gland: Secondary | ICD-10-CM | POA: Diagnosis not present

## 2015-10-22 DIAGNOSIS — R042 Hemoptysis: Secondary | ICD-10-CM | POA: Diagnosis not present

## 2015-10-22 DIAGNOSIS — R918 Other nonspecific abnormal finding of lung field: Secondary | ICD-10-CM | POA: Diagnosis not present

## 2015-10-22 DIAGNOSIS — C9000 Multiple myeloma not having achieved remission: Secondary | ICD-10-CM | POA: Diagnosis not present

## 2015-10-22 DIAGNOSIS — J449 Chronic obstructive pulmonary disease, unspecified: Secondary | ICD-10-CM | POA: Diagnosis not present

## 2015-10-22 LAB — CBC
HEMATOCRIT: 28.6 % — AB (ref 36.0–46.0)
HEMOGLOBIN: 8.9 g/dL — AB (ref 12.0–15.0)
MCH: 34.4 pg — ABNORMAL HIGH (ref 26.0–34.0)
MCHC: 31.1 g/dL (ref 30.0–36.0)
MCV: 110.4 fL — ABNORMAL HIGH (ref 78.0–100.0)
Platelets: 66 10*3/uL — ABNORMAL LOW (ref 150–400)
RBC: 2.59 MIL/uL — ABNORMAL LOW (ref 3.87–5.11)
RDW: 18.2 % — AB (ref 11.5–15.5)
WBC: 3.8 10*3/uL — AB (ref 4.0–10.5)

## 2015-10-22 NOTE — Progress Notes (Signed)
Patient stated she will take all daily meds at home. She is being discharged and will not take daily meds here today.

## 2015-10-22 NOTE — Progress Notes (Signed)
Discharge instructions gone over with patient.  Follow up appointments are made. Home medications gone over. Reasons to call the doctor discussed. Signs and symptoms of infection discussed. Diet and activity gone over. Patient verbalized understanding of instructions.

## 2015-10-22 NOTE — Discharge Summary (Signed)
Patient ID: Nancy Norris MRN: [id number] DOB/AGE: [id number] y.o.  Admit date: 10/21/2015 Discharge date: 10/22/2015  Supervising Physician: Dr Aletta Edouard  Admission Diagnoses: Left lung mass  Discharge Diagnoses:  Active Problems:   Hemoptysis   Discharged Condition: improved  Hospital Course: Pt with Hx Breast Ca Hx Multiple Myeloma Now with new +PET left lung mass Biopsy performed 10/6 in IR. Hemoptysis after 1 core biopsy Resolved quickly; none since yesterday She has no complaints; No pain CXR looks good this am IMPRESSION: Left mid lung region mass noted with ill-defined borders, likely due to hemorrhage from recent biopsy. No new opacity. Stable cardiac silhouette. There is aortic atherosclerosis. No pneumothorax evident.  Denies N/V Slept well; eating well I have seen and examined pt. Discussed with Dr Earleen Newport Plan for discharge today. To follow with Dr Sigurd Sos result of biopsy.  Consults: None  Significant Diagnostic Studies: CT guided left lung mass biopsy Treatments: Interventional Radiology Procedure Note Procedure:  CT guided core biopsy of LUL/lingular lung mass Complications:  Hemoptysis Estimated Blood Loss: 10-15 mL CT guided 18 G core biopsy of 4 cm lingular mass via 17 G needle.  Immediate cough and gross hemoptysis after one core sample. Procedure aborted; patient placed left side down and suctioned. Currently much less hemoptysis.  Will get STAT CXR and admit for observation.  Discharge Exam: Blood pressure (!) 116/48, pulse 66, temperature 98 F (36.7 C), temperature source Oral, resp. rate 17, height 5' 5" (1.651 m), weight 153 lb (69.4 kg), SpO2 96 %.  A/O Pleasant In NAD Denies hemoptysis since yesterday  Heart: RRR; +Murmur Lungs: CTA Site of Bx is clean and dry; NT - no hematoma Abd: soft; +BS; NT Extr: FROM Ambulating well UOP great; yellow  Results for orders placed or performed during the hospital  encounter of 10/21/15  APTT upon arrival  Result Value Ref Range   aPTT 26 24 - 36 seconds  CBC upon arrival  Result Value Ref Range   WBC 5.6 4.0 - 10.5 K/uL   RBC 2.82 (L) 3.87 - 5.11 MIL/uL   Hemoglobin 9.7 (L) 12.0 - 15.0 g/dL   HCT 30.7 (L) 36.0 - 46.0 %   MCV 108.9 (H) 78.0 - 100.0 fL   MCH 34.4 (H) 26.0 - 34.0 pg   MCHC 31.6 30.0 - 36.0 g/dL   RDW 17.8 (H) 11.5 - 15.5 %   Platelets 69 (L) 150 - 400 K/uL  Protime-INR upon arrival  Result Value Ref Range   Prothrombin Time 12.1 11.4 - 15.2 seconds   INR 0.89   CBC  Result Value Ref Range   WBC 3.8 (L) 4.0 - 10.5 K/uL   RBC 2.59 (L) 3.87 - 5.11 MIL/uL   Hemoglobin 8.9 (L) 12.0 - 15.0 g/dL   HCT 28.6 (L) 36.0 - 46.0 %   MCV 110.4 (H) 78.0 - 100.0 fL   MCH 34.4 (H) 26.0 - 34.0 pg   MCHC 31.1 30.0 - 36.0 g/dL   RDW 18.2 (H) 11.5 - 15.5 %   Platelets 66 (L) 150 - 400 K/uL    Disposition: Left lung mass biopsy 10/6 in IR with Dr Kathlene Cote Post core biopsy hemoptysis Overnight observation Hemoptysis has resolved Pt doing well---now for discharge Resume all meds Follow with Dr Julien Nordmann She has good understanding of discharge instructions  Discharge Instructions    Call MD for:  difficulty breathing, headache or visual disturbances    Complete by:  As directed    Call  MD for:  extreme fatigue    Complete by:  As directed    Call MD for:  hives    Complete by:  As directed    Call MD for:  persistant dizziness or light-headedness    Complete by:  As directed    Call MD for:  persistant nausea and vomiting    Complete by:  As directed    Call MD for:  redness, tenderness, or signs of infection (pain, swelling, redness, odor or green/yellow discharge around incision site)    Complete by:  As directed    Call MD for:  severe uncontrolled pain    Complete by:  As directed    Call MD for:  temperature >100.4    Complete by:  As directed    Diet - low sodium heart healthy    Complete by:  As directed    Discharge  instructions    Complete by:  As directed    Follow up with Dr [name]; continue all home meds; restful over weekend   Discharge wound care:    Complete by:  As directed    Keep fresh bandaid on biopsy site for 2-3 days   Increase activity slowly    Complete by:  As directed    Lifting restrictions    Complete by:  As directed    No lifting over 10 lbs x 2 days         Electronically Signed: , A 10/22/2015, 8:49 AM   I have spent Greater Than 30 Minutes discharging Nancy Norris.    

## 2015-10-24 ENCOUNTER — Telehealth: Payer: Self-pay | Admitting: Adult Health

## 2015-10-24 ENCOUNTER — Other Ambulatory Visit: Payer: Self-pay | Admitting: Medical Oncology

## 2015-10-24 DIAGNOSIS — C9000 Multiple myeloma not having achieved remission: Secondary | ICD-10-CM

## 2015-10-24 NOTE — Progress Notes (Signed)
See 10/24/15 PN

## 2015-10-24 NOTE — Telephone Encounter (Signed)
Nancy Needles, NP sent to Nancy Norris, CMA  Cc: Deneise Lever, MD        BNP CHF marker is better than couple of years ago-no evidence of fluid overload on exam yesterday , keep follow up with cards as planned  CKD appears stable  D Dimer is only minimally elevated. Venous dopplers pending .  Will decide if any further testing is needed after venous dopplers. /CT biopsy.     Spoke with the pt and notified of results/recs and she verbalized understanding  Nothing further needed

## 2015-10-25 ENCOUNTER — Ambulatory Visit: Payer: Medicare Other

## 2015-10-25 ENCOUNTER — Other Ambulatory Visit (HOSPITAL_BASED_OUTPATIENT_CLINIC_OR_DEPARTMENT_OTHER): Payer: Medicare Other

## 2015-10-25 ENCOUNTER — Encounter: Payer: Self-pay | Admitting: Internal Medicine

## 2015-10-25 ENCOUNTER — Telehealth: Payer: Self-pay | Admitting: Internal Medicine

## 2015-10-25 ENCOUNTER — Ambulatory Visit (HOSPITAL_BASED_OUTPATIENT_CLINIC_OR_DEPARTMENT_OTHER): Payer: Medicare Other | Admitting: Internal Medicine

## 2015-10-25 ENCOUNTER — Ambulatory Visit (HOSPITAL_BASED_OUTPATIENT_CLINIC_OR_DEPARTMENT_OTHER): Payer: Self-pay | Admitting: Nurse Practitioner

## 2015-10-25 ENCOUNTER — Ambulatory Visit (HOSPITAL_BASED_OUTPATIENT_CLINIC_OR_DEPARTMENT_OTHER): Payer: Medicare Other

## 2015-10-25 ENCOUNTER — Other Ambulatory Visit: Payer: Medicare Other

## 2015-10-25 VITALS — BP 126/58 | HR 72 | Temp 98.2°F | Resp 21 | Ht 65.0 in | Wt 156.8 lb

## 2015-10-25 DIAGNOSIS — R530 Neoplastic (malignant) related fatigue: Secondary | ICD-10-CM

## 2015-10-25 DIAGNOSIS — C9000 Multiple myeloma not having achieved remission: Secondary | ICD-10-CM | POA: Diagnosis not present

## 2015-10-25 DIAGNOSIS — R918 Other nonspecific abnormal finding of lung field: Secondary | ICD-10-CM

## 2015-10-25 DIAGNOSIS — Z5111 Encounter for antineoplastic chemotherapy: Secondary | ICD-10-CM | POA: Diagnosis not present

## 2015-10-25 DIAGNOSIS — D630 Anemia in neoplastic disease: Secondary | ICD-10-CM

## 2015-10-25 DIAGNOSIS — D631 Anemia in chronic kidney disease: Secondary | ICD-10-CM

## 2015-10-25 DIAGNOSIS — Z5112 Encounter for antineoplastic immunotherapy: Secondary | ICD-10-CM | POA: Diagnosis not present

## 2015-10-25 DIAGNOSIS — N189 Chronic kidney disease, unspecified: Secondary | ICD-10-CM | POA: Diagnosis not present

## 2015-10-25 DIAGNOSIS — T7840XA Allergy, unspecified, initial encounter: Secondary | ICD-10-CM

## 2015-10-25 LAB — COMPREHENSIVE METABOLIC PANEL
ALBUMIN: 3 g/dL — AB (ref 3.5–5.0)
ALK PHOS: 133 U/L (ref 40–150)
ALT: 35 U/L (ref 0–55)
AST: 25 U/L (ref 5–34)
Anion Gap: 10 mEq/L (ref 3–11)
BILIRUBIN TOTAL: 0.43 mg/dL (ref 0.20–1.20)
BUN: 28 mg/dL — AB (ref 7.0–26.0)
CO2: 20 meq/L — AB (ref 22–29)
Calcium: 9.2 mg/dL (ref 8.4–10.4)
Chloride: 113 mEq/L — ABNORMAL HIGH (ref 98–109)
Creatinine: 2.5 mg/dL — ABNORMAL HIGH (ref 0.6–1.1)
EGFR: 18 mL/min/{1.73_m2} — AB (ref >90)
GLUCOSE: 109 mg/dL (ref 70–140)
POTASSIUM: 3.6 meq/L (ref 3.5–5.1)
SODIUM: 143 meq/L (ref 136–145)
TOTAL PROTEIN: 5.9 g/dL — AB (ref 6.4–8.3)

## 2015-10-25 LAB — CBC WITH DIFFERENTIAL/PLATELET
BASO%: 0.5 % (ref 0.0–2.0)
Basophils Absolute: 0 10*3/uL (ref 0.0–0.1)
EOS%: 2.4 % (ref 0.0–7.0)
Eosinophils Absolute: 0.1 10*3/uL (ref 0.0–0.5)
HCT: 28.7 % — ABNORMAL LOW (ref 34.8–46.6)
HEMOGLOBIN: 9.4 g/dL — AB (ref 11.6–15.9)
LYMPH%: 24.5 % (ref 14.0–49.7)
MCH: 35.1 pg — ABNORMAL HIGH (ref 25.1–34.0)
MCHC: 32.8 g/dL (ref 31.5–36.0)
MCV: 107.1 fL — AB (ref 79.5–101.0)
MONO#: 0.7 10*3/uL (ref 0.1–0.9)
MONO%: 18.8 % — AB (ref 0.0–14.0)
NEUT%: 53.8 % (ref 38.4–76.8)
NEUTROS ABS: 2 10*3/uL (ref 1.5–6.5)
Platelets: 87 10*3/uL — ABNORMAL LOW (ref 145–400)
RBC: 2.68 10*6/uL — AB (ref 3.70–5.45)
RDW: 18.2 % — AB (ref 11.2–14.5)
WBC: 3.7 10*3/uL — AB (ref 3.9–10.3)
lymph#: 0.9 10*3/uL (ref 0.9–3.3)

## 2015-10-25 MED ORDER — METHYLPREDNISOLONE SODIUM SUCC 125 MG IJ SOLR
125.0000 mg | Freq: Once | INTRAMUSCULAR | Status: AC
  Administered 2015-10-25: 125 mg via INTRAVENOUS

## 2015-10-25 MED ORDER — FAMOTIDINE IN NACL 20-0.9 MG/50ML-% IV SOLN
INTRAVENOUS | Status: AC
  Filled 2015-10-25: qty 50

## 2015-10-25 MED ORDER — FAMOTIDINE IN NACL 20-0.9 MG/50ML-% IV SOLN
20.0000 mg | Freq: Once | INTRAVENOUS | Status: AC
  Administered 2015-10-25: 20 mg via INTRAVENOUS

## 2015-10-25 MED ORDER — SODIUM CHLORIDE 0.9 % IJ SOLN
10.0000 mL | INTRAMUSCULAR | Status: DC | PRN
  Administered 2015-10-25: 10 mL
  Filled 2015-10-25: qty 10

## 2015-10-25 MED ORDER — DIPHENHYDRAMINE HCL 50 MG/ML IJ SOLN
INTRAMUSCULAR | Status: AC
  Filled 2015-10-25: qty 1

## 2015-10-25 MED ORDER — DEXTROSE 5 % IV SOLN
27.0000 mg/m2 | Freq: Once | INTRAVENOUS | Status: AC
  Administered 2015-10-25: 50 mg via INTRAVENOUS
  Filled 2015-10-25: qty 25

## 2015-10-25 MED ORDER — SODIUM CHLORIDE 0.9 % IV SOLN
225.0000 mg/m2 | Freq: Once | INTRAVENOUS | Status: AC
  Administered 2015-10-25: 420 mg via INTRAVENOUS
  Filled 2015-10-25: qty 21

## 2015-10-25 MED ORDER — SODIUM CHLORIDE 0.9 % IV SOLN
Freq: Once | INTRAVENOUS | Status: AC
  Administered 2015-10-25: 11:00:00 via INTRAVENOUS

## 2015-10-25 MED ORDER — DIPHENHYDRAMINE HCL 50 MG/ML IJ SOLN
25.0000 mg | Freq: Once | INTRAMUSCULAR | Status: AC
Start: 2015-10-25 — End: 2015-10-25
  Administered 2015-10-25: 25 mg via INTRAVENOUS

## 2015-10-25 MED ORDER — HEPARIN SOD (PORK) LOCK FLUSH 100 UNIT/ML IV SOLN
500.0000 [IU] | Freq: Once | INTRAVENOUS | Status: AC | PRN
  Administered 2015-10-25: 500 [IU]
  Filled 2015-10-25: qty 5

## 2015-10-25 MED ORDER — DEXAMETHASONE SODIUM PHOSPHATE 100 MG/10ML IJ SOLN
Freq: Once | INTRAMUSCULAR | Status: AC
  Administered 2015-10-25: 11:00:00 via INTRAVENOUS
  Filled 2015-10-25: qty 4

## 2015-10-25 MED ORDER — METHYLPREDNISOLONE SODIUM SUCC 125 MG IJ SOLR
INTRAMUSCULAR | Status: AC
  Filled 2015-10-25: qty 2

## 2015-10-25 NOTE — Progress Notes (Signed)
[place] [phone]   Fax:([phone]) [place], MD [address] [place] [address]  PRINCIPAL DIAGNOSES:  1. Large left upper lobe lung mass, final pathology is still pending 2. Recurrent multiple myeloma initially diagnosed in 2002, initially with smoldering myeloma at [facility].  3. Ductal carcinoma in situ status post mastectomy with sentinel lymph node biopsy in October 2008.  PRIOR THERAPY:  1. Status post treatment with tamoxifen from November 2008 through February 2009, discontinued secondary to intolerance. 2. Status post 3 cycles of chemotherapy with Revlimid and Decadron followed by 1 cycle of Decadron only with mild response. 3. Status post 2 cycles of systemic chemotherapy with Velcade, Doxil and Decadron discontinued secondary to significant peripheral neuropathy. Last dose was given May 2010 at [facility]. 4. Status post autologous peripheral blood stem cell transplant on October 01, 2008 at [facility] under the care of Dr. Phyllis Ginger.  5. Systemic chemotherapy with Carfilzomib initially was 20 mg/M2 and will be increased after cycle #1 to 36 mg/M2 on days 1, 2, 8, 9, 15 and 16 every 4 weeks in addition to Cytoxan 300 mg/M2 and Decadron 40 mg by mouth weekly basis, status post 4 cycles. First cycle on 12/29/2012. 6. She resumed chemotherapy with Carfilzomib, Cytoxan, and Decadron on 05/03/2014. Status post 6 cycles.  CURRENT THERAPY: The patient resumed her systemic chemotherapy again with Carfilzomib, Cytoxan and Decadron on 07/11/2015. Status post 3 cycles.  INTERVAL HISTORY: Nancy Norris 75 y.o. female returns to the clinic today for follow-up visit accompanied by her daughter. She continues to complain of fatigue and shortness breath with exertion. She also has intermittent left-sided chest wall pain. She tolerated the last cycle of her  systemic chemotherapy fairly well except for pancytopenia. She was found on previous imaging studies including CT scan of the chest and PET scan to have a large mass in the left upper lobe of the lung. I ordered CT guided core biopsy of the left upper lobe lung mass which was performed last week and unfortunately the final pathology is still pending. She denied having any fever or chills today. She has cough or hemoptysis. She denied having any nausea or vomiting. No significant weight loss or night sweats. She is here today for evaluation before starting cycle #3.  MEDICAL HISTORY: Past Medical History:  Diagnosis Date  . Anemia   . Anginal pain ([place])    used NTG x 2 May 31 and 06/15/13   . Anxiety   . Arthritis   . B12 deficiency 12/04/2014  . Breast cancer ([place])   . CKD (chronic kidney disease) stage 3, GFR 30-59 ml/min   . Complication of anesthesia   . COPD (chronic obstructive pulmonary disease) ([place])   . Depression   . Dizziness   . Dyspnea   . Fibromyalgia   . Fibromyalgia   . GERD (gastroesophageal reflux disease)   . Headache(784.0)   . Heart murmur   . Hemoptysis 10/21/2015  . History of blood transfusion    last one May 12   . Hx of cardiovascular stress test    LexiScan with low level exercise Myoview (02/2013): No ischemia, EF 72%; normal study  . Hx of echocardiogram    a.  Echocardiogram (12/26/2012): EF 31-51%, grade 1 diastolic dysfunction;   b.  Echocardiogram (02/2013): EF 55-60%, no WMA, trivial effusion  . Hyperkalemia   . Hyponatremia   .  Hypothyroidism   . Mucositis   . Multiple myeloma   . Myocardial infarction    in past, patient was unaware.   . Neuropathy ([place])   . Pain in joint, pelvic region and thigh 07/07/2015  . PONV (postoperative nausea and vomiting) 2008   after mastestomy    ALLERGIES:  is allergic to codeine; latex; other; onion; zyprexa [olanzapine]; adhesive [tape]; hydrocodone; iodinated diagnostic agents; and sulfa  antibiotics.  MEDICATIONS:  Current Outpatient Prescriptions  Medication Sig Dispense Refill  . albuterol (PROVENTIL HFA;VENTOLIN HFA) 108 (90 Base) MCG/ACT inhaler Inhale 1-2 puffs into the lungs every 6 (six) hours as needed for wheezing or shortness of breath (cough). 1 Inhaler 3  . ascorbic acid (VITAMIN C) 250 MG CHEW Chew 250 mg by mouth daily.    Marland Kitchen azelastine (ASTELIN) 0.1 % nasal spray Place 2 sprays into both nostrils 2 (two) times daily. Use in each nostril as directed 30 mL 2  . buPROPion (WELLBUTRIN XL) 300 MG 24 hr tablet Take 1 tablet (300 mg total) by mouth daily. 90 tablet 1  . cetirizine (ZYRTEC) 10 MG tablet Take 10 mg by mouth daily.     . Cholecalciferol 4000 UNITS TABS Take 4,000 Units by mouth daily.    . citalopram (CELEXA) 40 MG tablet Take 1 tablet daily or as directed 90 tablet 1  . cromolyn (OPTICROM) 4 % ophthalmic solution Place 1 drop into both eyes 4 (four) times daily.    . ferrous sulfate 325 (65 FE) MG tablet Take 1 tablet daily or as directed 90 tablet 1  . fluticasone (FLONASE) 50 MCG/ACT nasal spray Place 2 sprays into both nostrils daily. 16 g 0  . fluticasone furoate-vilanterol (BREO ELLIPTA) 100-25 MCG/INH AEPB Inhale 1 puff into the lungs daily. 28 each 0  . gabapentin (NEURONTIN) 300 MG capsule TAKE 1 CAPSULE BY MOUTH AT BEDTIME. 90 capsule 1  . isosorbide mononitrate (IMDUR) 30 MG 24 hr tablet Take 1 tablet (30 mg total) by mouth daily. 90 tablet 3  . levothyroxine (SYNTHROID, LEVOTHROID) 150 MCG tablet Take 1 tablet (150 mcg total) by mouth daily. 90 tablet 1  . loratadine (CLARITIN) 10 MG tablet Take 10 mg by mouth daily.    Marland Kitchen LORazepam (ATIVAN) 0.5 MG tablet Take 1 tablet (0.5 mg total) by mouth every 8 (eight) hours as needed (nausea). 30 tablet 0  . acetaminophen (TYLENOL) 325 MG tablet Take 650 mg by mouth every 6 (six) hours as needed for mild pain or moderate pain.     Marland Kitchen loteprednol (ALREX) 0.2 % SUSP Place 1 drop into both eyes 4 (four) times  daily. 1 Bottle PRN  . meloxicam (MOBIC) 15 MG tablet Take 1 tablet (15 mg total) by mouth daily. 90 tablet 3  . midodrine (PROAMATINE) 10 MG tablet TAKE 1 TABLET BY MOUTH 3 TIMES DAILY. 270 tablet 3  . montelukast (SINGULAIR) 10 MG tablet Take 1 tablet (10 mg total) by mouth daily. 30 tablet 2  . nitroGLYCERIN (NITROSTAT) 0.4 MG SL tablet Place 0.4 mg under the tongue every 5 (five) minutes as needed for chest pain. Reported on 03/30/2015    . pantoprazole (PROTONIX) 40 MG tablet TAKE 1 TABLET DAILY FOR ACID REFLUX & INDIGESTION 90 tablet 1  . pilocarpine (SALAGEN) 5 MG tablet Take 1 tablet 3 x / day for dry mouth. 270 tablet 1  . PRESCRIPTION MEDICATION Pt gets chemo at [facility].    Marland Kitchen promethazine (PHENERGAN) 25 MG tablet Take 25 mg by  mouth.    . ranitidine (ZANTAC) 300 MG tablet Take 1 to 2 tablets daily for heartburn & reflux to allow wean and transition from PPI / pantoprazole (Patient taking differently: Take 300 mg by mouth at bedtime. Take 1 to 2 tablets daily for heartburn & reflux to allow wean and transition from PPI / pantoprazole) 180 tablet 3  . senna (SENOKOT) 8.6 MG tablet Take 1 tablet by mouth at bedtime.    . Tiotropium Bromide Monohydrate (SPIRIVA RESPIMAT) 2.5 MCG/ACT AERS Inhale 2 puffs into the lungs daily. 1 Inhaler 5  . traMADol (ULTRAM) 50 MG tablet Take 1 to 2 tablets by mouth 4 times a day as needed for pain. 180 tablet 0   No current facility-administered medications for this visit.    Facility-Administered Medications Ordered in Other Visits  Medication Dose Route Frequency Provider Last Rate Last Dose  . 0.9 %  sodium chloride infusion   Intravenous Once Adrena E Johnson, PA-C      . acetaminophen (TYLENOL) tablet 650 mg  650 mg Oral Once Susanne Borders, NP      . sodium chloride 0.9 % injection 10 mL  10 mL Intracatheter PRN Curt Bears, MD   10 mL at 01/05/13 1825  . sodium chloride 0.9 % injection 10 mL  10 mL Intracatheter PRN Curt Bears, MD   10 mL at  06/15/14 1514    SURGICAL HISTORY:  Past Surgical History:  Procedure Laterality Date  . ABDOMINAL HYSTERECTOMY  1981  . AV FISTULA PLACEMENT Left 06/19/2013   Procedure: CREATION OF LEFT ARM ARTERIOVENOUS (AV) FISTULA ;  Surgeon: Angelia Mould, MD;  Location: [facility];  Service: Vascular;  Laterality: Left;  . BREAST RECONSTRUCTION    . BREAST SURGERY    . CATARACT EXTRACTION, BILATERAL    . CHOLECYSTECTOMY  1971  . EYE SURGERY Bilateral    lens implant  . history of Port removal    . LUNG BIOPSY  10/21/2015  . MASTECTOMY Left 2008  . PORTACATH PLACEMENT  12/2012   has had 2  . Status post stem cell transplant on September 28, 2008.      REVIEW OF SYSTEMS:  Constitutional: positive for fatigue Eyes: negative Ears, nose, mouth, throat, and face: negative Respiratory: positive for pleurisy/chest pain Cardiovascular: negative Gastrointestinal: negative Genitourinary:negative Integument/breast: negative Hematologic/lymphatic: negative Musculoskeletal:negative Neurological: negative Behavioral/Psych: negative Endocrine: negative Allergic/Immunologic: negative   PHYSICAL EXAMINATION: General appearance: alert, cooperative and no distress Head: Normocephalic, without obvious abnormality, atraumatic Neck: no adenopathy, no JVD, supple, symmetrical, trachea midline and thyroid not enlarged, symmetric, no tenderness/mass/nodules Lymph nodes: Cervical, supraclavicular, and axillary nodes normal. Resp: clear to auscultation bilaterally Back: symmetric, no curvature. ROM normal. No CVA tenderness. Cardio: regular rate and rhythm, S1, S2 normal, no murmur, click, rub or gallop GI: soft, non-tender; bowel sounds normal; no masses,  no organomegaly Extremities: extremities normal, atraumatic, no cyanosis or edema Neurologic: Alert and oriented X 3, normal strength and tone. Normal symmetric reflexes. Normal coordination and gait  ECOG PERFORMANCE STATUS: 1 - Symptomatic but  completely ambulatory  Blood pressure (!) 126/58, pulse 72, temperature 98.2 F (36.8 C), temperature source Oral, resp. rate (!) 21, height '5\' 5"'$  (1.651 m), weight 156 lb 12.8 oz (71.1 kg), SpO2 96 %.  LABORATORY DATA: Lab Results  Component Value Date   WBC 3.7 (L) 10/25/2015   HGB 9.4 (L) 10/25/2015   HCT 28.7 (L) 10/25/2015   MCV 107.1 (H) 10/25/2015   PLT 87 (L)  10/25/2015      Chemistry      Component Value Date/Time   NA 143 10/25/2015 0919   K 3.6 10/25/2015 0919   CL 109 10/20/2015 1647   CL 105 03/19/2012 0811   CO2 20 (L) 10/25/2015 0919   BUN 28.0 (H) 10/25/2015 0919   CREATININE 2.5 (H) 10/25/2015 0919      Component Value Date/Time   CALCIUM 9.2 10/25/2015 0919   ALKPHOS 133 10/25/2015 0919   AST 25 10/25/2015 0919   ALT 35 10/25/2015 0919   BILITOT 0.43 10/25/2015 0919     RADIOGRAPHIC STUDIES: Dg Chest 1 View  Result Date: 10/21/2015 CLINICAL DATA:  Hemoptysis and chest pain. History of COPD. Status post biopsy of a left lung mass today. EXAM: CHEST 1 VIEW COMPARISON:  PET CT scan 10/11/2015. FINDINGS: There is no pneumothorax. Left chest mass is again seen. The lungs are emphysematous. No pleural effusion. Hiatal hernia noted. IMPRESSION: Negative for pneumothorax after biopsy.  No acute disease. Electronically Signed   By: Inge Rise M.D.   On: 10/21/2015 12:55   X-ray Chest Pa And Lateral  Result Date: 10/22/2015 CLINICAL DATA:  Hemoptysis. EXAM: CHEST  2 VIEW COMPARISON:  Chest radiograph October 21, 2015 and PET-CT October 11, 2015 FINDINGS: Mass remains in the left mid lung, measuring 4.5 x 3.3 x 3.6 cm. The borders of this mass appears somewhat ill-defined, likely due to recent biopsy. There is no edema or consolidation. The heart size and pulmonary vascularity are normal. Port-A-Cath tip is at the cavoatrial junction. No pneumothorax. There is atherosclerotic calcification in the aorta. No adenopathy is apparent by radiography. No evident bone  lesions. IMPRESSION: Left mid lung region mass noted with ill-defined borders, likely due to hemorrhage from recent biopsy. No new opacity. Stable cardiac silhouette. There is aortic atherosclerosis. No pneumothorax evident. Electronically Signed   By: Lowella Grip III M.D.   On: 10/22/2015 07:50   Ct Chest Wo Contrast  Result Date: 09/26/2015 CLINICAL DATA:  History of breast cancer and multiple myeloma. Lung nodule seen on recent chest x-ray. EXAM: CT CHEST WITHOUT CONTRAST TECHNIQUE: Multidetector CT imaging of the chest was performed following the standard protocol without IV contrast. COMPARISON:  09/13/2015 FINDINGS: Cardiovascular: The heart size is normal. No pericardial effusion. Coronary artery calcification is noted. Atherosclerotic calcification is noted in the wall of the thoracic aorta. Right Port-A-Cath tip is positioned at the SVC/ RA junction. Mediastinum/Nodes: Scattered small lymph nodes identified in the mediastinum without lymphadenopathy. No evidence for gross hilar lymphadenopathy although assessment is limited by the lack of intravenous contrast on today's study. Small hiatal hernia noted. Esophagus otherwise unremarkable. There is no axillary lymphadenopathy. Lungs/Pleura: 3.0 x 3.8 cm posterior lingular mass highly suspicious for neoplasm. Centrilobular emphysema noted in the upper lungs bilaterally. Small region of tree in bud peribronchovascular nodularity in the right upper lobe likely related to prior or ongoing atypical infection. No pulmonary edema. No pleural effusion. Upper Abdomen: Abdominal aortic atherosclerosis. Musculoskeletal: Bone windows reveal no worrisome lytic or sclerotic osseous lesions. IMPRESSION: 1. 3.0 x 3.8 cm left lung mass persistent since 08/23/2015 and new since 12/02/2014. Given the well-defined round configuration, neoplasm is a distinct concern. No evidence for bulky left hilar or mediastinal lymphadenopathy. PET-CT recommended to further evaluate.  2. Emphysema. 3. Abdominal aortic atherosclerosis. Electronically Signed   By: Misty Stanley M.D.   On: 09/26/2015 13:49   Nm Pet Image Initial (pi) Skull Base To Thigh  Result Date: 10/11/2015  CLINICAL DATA:  Initial treatment strategy for left lung mass. Personal history of breast carcinoma. EXAM: NUCLEAR MEDICINE PET SKULL BASE TO THIGH TECHNIQUE: 8.0 mCi F-18 FDG was injected intravenously. Full-ring PET imaging was performed from the skull base to thigh after the radiotracer. CT data was obtained and used for attenuation correction and anatomic localization. FASTING BLOOD GLUCOSE:  Value: 118 mg/dl COMPARISON:  Chest CT on 09/26/2015 FINDINGS: NECK No hypermetabolic lymph nodes in the neck. Hypermetabolic mucosal thickening is noted within the right maxillary sinus, consistent with sinusitis. CHEST No hypermetabolic mediastinal or hilar nodes. Previous left mastectomy and axillary lymph node dissection. No evidence of hypermetabolic axillary lymph nodes. 4.2 x 3.8 cm ill-defined mass is seen in the posterior lingula on image 37/8 which is further increase in size compared to previous study when it measured 3.8 x 3.0 cm. This has SUV max of 19.7. No other suspicious pulmonary nodules or masses seen on CT images. Mild emphysema noted. No evidence of pleural effusion. ABDOMEN/PELVIS No abnormal hypermetabolic activity within the liver, pancreas, adrenal glands, or spleen. 1.6 cm left adrenal mass is stable compared to previous abdomen CT on 11/23/2013, and shows absence of FDG activity previous consistent with a benign adrenal adenoma. No hypermetabolic lymph nodes in the abdomen or pelvis. Aortic atherosclerosis. Mild diverticulosis is seen involving the sigmoid colon, without evidence of diverticulitis. SKELETON No focal hypermetabolic activity to suggest skeletal metastasis. IMPRESSION: 4.2 cm hypermetabolic of mass within the lingula, highly suspicious for primary bronchogenic carcinoma. No evidence of  thoracic lymph node or distant metastatic disease. Stable benign left adrenal adenoma. Electronically Signed   By: Myles Rosenthal M.D.   On: 10/11/2015 13:43   Ct Biopsy  Result Date: 10/21/2015 CLINICAL DATA:  4 cm left upper lobe/ lingular lung mass demonstrating abnormal elevated hypermetabolism by PET scan. The patient presents for biopsy. She has a history of multiple myeloma and is currently undergoing treatment with chronic thrombocytopenia. Platelet count today was 69. At this level, it was felt safe to proceed with biopsy. EXAM: CT GUIDED CORE BIOPSY OF BODY PART ANESTHESIA/SEDATION: 1.0 mg IV Versed; 50 mcg IV Fentanyl Total Moderate Sedation Time:  40 minutes. The patient's level of consciousness and physiologic status were continuously monitored during the procedure by Radiology nursing. PROCEDURE: The procedure risks, benefits, and alternatives were explained to the patient. Questions regarding the procedure were encouraged and answered. The patient understands and consents to the procedure. A time-out was performed prior to initiating the procedure. The left lateral chest wall was prepped with chlorhexidine in a sterile fashion, and a sterile drape was applied covering the operative field. A sterile gown and sterile gloves were used for the procedure. Local anesthesia was provided with 1% Lidocaine. Initial CT was performed in a supine position with the left side rolled up slightly. Under CT fluoroscopic guidance, a 17 gauge needle was advanced into the peripheral aspect of the lingular mass. A single 18 gauge core biopsy sample was obtained and sample placed in formalin. The 17 gauge needle was removed and the patient rolled on the CT table with the left side down. COMPLICATIONS: SIR level B: Nominal therapy (including overnight admission for observation), no consequence. FINDINGS: Rounded mass in the lateral aspect of the left upper lobe/lingula measures approximately 4 cm. After placement of the 17  gauge needle into the periphery of the mass, a single coaxial 18 gauge core biopsy sample was performed. There was immediate coughing and gross hemoptysis after the single core biopsy  sample. The patient was immediately placed in a decubitus position with the left side down. Hemoptysis lasted for approximately 15 minutes with intermittent mild gross hemoptysis resulting in a total blood loss of no more than 15 mL. The hemoptysis did eventually resolve. The patient never became hypoxic and immediate postprocedural CT shows no evidence of pneumothorax. A chest x-ray will be obtained immediately following the biopsy. The patient will be admitted for overnight observation due to the gross hemoptysis. IMPRESSION: CT-guided core biopsy performed of a a left upper lobe/lingular lung mass. A single intact core biopsy sample was able to be obtained. Additional samples could not be performed due to development of gross hemoptysis. This appear to be under control after initial observation. The patient will be admitted for overnight observation. Electronically Signed   By: Aletta Edouard M.D.   On: 10/21/2015 16:33    ASSESSMENT AND PLAN: This is a very pleasant 75 years old white female with:  1) history of multiple myeloma as well as chronic kidney disease status post several chemotherapy regimen and most recently treated with Carfilzomib, Cytoxan and Decadron status post 5 cycles. The patient has been tolerating her treatment well except for the fatigue secondary to end-stage renal disease as well as anemia of chronic disease. The patient is currently on observation and feeling fine. Unfortunately the recent myeloma panel showed evidence for disease progression with increase in the free kappa light chain and worsening of her renal function as well as anemia. I discussed the lab result with the patient and her daughter. I recommended for her to resume her systemic chemotherapy with Carfilzomib, Cytoxan and Decadron.  She started the first dose of the treatment on 07/11/2015. She tolerated the first 3 cycles well with no specific complaints except for fatigue and pancytopenia. She is here today to start cycle #4 of her treatment. I will see her back for follow-up visit in 4 weeks with repeat myeloma panel for reevaluation of her disease.  2) new left upper lobe lung mass: Preliminary pathology is not consistent with carcinoma of the lung or a myeloproliferative disorder according to Dr. Lyndon Code. He is planning to send the tissue block to [facility] for a second opinion. Once the final pathology becomes available, I will discuss the treatment options with the patient.  3) For the chronic kidney disease, the patient will continue her routine follow-up visit with her nephrologist.  The patient was advised to call immediately if she has any concerning symptoms in the interval. The patient voices understanding of current disease status and treatment options and is in agreement with the current care plan.  All questions were answered. The patient knows to call the clinic with any problems, questions or concerns. We can certainly see the patient much sooner if necessary.  Disclaimer: This note was dictated with voice recognition software. Similar sounding words can inadvertently be transcribed and may not be corrected upon review.

## 2015-10-25 NOTE — Patient Instructions (Signed)
[facility] Discharge Instructions for Patients Receiving Chemotherapy  Today you received the following chemotherapy agents Kyprolis and Cytoxan  To help prevent nausea and vomiting after your treatment, we encourage you to take your nausea medication.   If you develop nausea and vomiting that is not controlled by your nausea medication, call the clinic.   BELOW ARE SYMPTOMS THAT SHOULD BE REPORTED IMMEDIATELY:  *FEVER GREATER THAN 100.5 F  *CHILLS WITH OR WITHOUT FEVER  NAUSEA AND VOMITING THAT IS NOT CONTROLLED WITH YOUR NAUSEA MEDICATION  *UNUSUAL SHORTNESS OF BREATH  *UNUSUAL BRUISING OR BLEEDING  TENDERNESS IN MOUTH AND THROAT WITH OR WITHOUT PRESENCE OF ULCERS  *URINARY PROBLEMS  *BOWEL PROBLEMS  UNUSUAL RASH Items with * indicate a potential emergency and should be followed up as soon as possible.  Feel free to call the clinic you have any questions or concerns. The clinic phone number is [phone].  Please show the CHEMO ALERT CARD at check-in to the Emergency Department and triage nurse.   

## 2015-10-25 NOTE — Progress Notes (Signed)
Ok to treat per Dr Julien Nordmann with platelets 87 and Creat 2.5  Patient complaining of generalized itching mostly in trunk and back; no rash noted. [name] NP aware and at bedside to see patient. No other complaints voiced.   1340- no complaints voiced; no itching per patient.

## 2015-10-25 NOTE — Telephone Encounter (Signed)
Appointments complete per 10/10 los. Patient to get print out in infusion area. Added weekly lab/chemo and f/u in 2weeks. Also due to limited provider availability added additional f/u appointments at the start of chemo cylces. Patient aware f/u will be adjusted or added per MM instruction.

## 2015-10-26 ENCOUNTER — Encounter: Payer: Self-pay | Admitting: Nurse Practitioner

## 2015-10-26 ENCOUNTER — Ambulatory Visit (HOSPITAL_BASED_OUTPATIENT_CLINIC_OR_DEPARTMENT_OTHER): Payer: Medicare Other

## 2015-10-26 VITALS — BP 137/66 | HR 78 | Temp 98.6°F | Resp 20

## 2015-10-26 DIAGNOSIS — C9000 Multiple myeloma not having achieved remission: Secondary | ICD-10-CM

## 2015-10-26 DIAGNOSIS — Z5112 Encounter for antineoplastic immunotherapy: Secondary | ICD-10-CM

## 2015-10-26 MED ORDER — DEXTROSE 5 % IV SOLN
27.0000 mg/m2 | Freq: Once | INTRAVENOUS | Status: AC
  Administered 2015-10-26: 50 mg via INTRAVENOUS
  Filled 2015-10-26: qty 25

## 2015-10-26 MED ORDER — SODIUM CHLORIDE 0.9 % IV SOLN
Freq: Once | INTRAVENOUS | Status: AC
  Administered 2015-10-26: 14:00:00 via INTRAVENOUS
  Filled 2015-10-26: qty 4

## 2015-10-26 MED ORDER — SODIUM CHLORIDE 0.9 % IJ SOLN
10.0000 mL | INTRAMUSCULAR | Status: DC | PRN
  Filled 2015-10-26: qty 10

## 2015-10-26 MED ORDER — SODIUM CHLORIDE 0.9 % IV SOLN: Freq: Once | INTRAVENOUS | Status: DC

## 2015-10-26 MED ORDER — SODIUM CHLORIDE 0.9 % IV SOLN
Freq: Once | INTRAVENOUS | Status: AC
  Administered 2015-10-26: 13:00:00 via INTRAVENOUS

## 2015-10-26 MED ORDER — HEPARIN SOD (PORK) LOCK FLUSH 100 UNIT/ML IV SOLN
500.0000 [IU] | Freq: Once | INTRAVENOUS | Status: DC | PRN
  Filled 2015-10-26: qty 5

## 2015-10-26 NOTE — Assessment & Plan Note (Signed)
Patient presented to the [facility] today to receive cycle 11, day 1 of her Cytoxan/Kyprolis chemotherapy regimen.  She is scheduled to return tomorrow for cycle 11, day 2 of the same regimen.  She is scheduled to return for labs and chemotherapy again on 11/01/2015.

## 2015-10-26 NOTE — Assessment & Plan Note (Signed)
Patient had already completed her Kyprolis infusion; and was in the midst of her Cytoxan chemotherapy when she developed sick significant, generalized pruritus.  She had no rash or hives noted whatsoever.  She also had no other symptoms whatsoever.  Infusion was held; and patient received Benadryl, Pepcid, and Solu-Medrol per protocol.  All symptoms resolve; and patient was able to complete all of her treatment today with no further issues.  Patient confirmed that she already has the printed instructions regarding use of both Benadryl and Pepcid on home to take for any mild reaction symptoms.  She was also encouraged to directly to the emergency department for any worsening symptoms whatsoever.

## 2015-10-26 NOTE — Progress Notes (Signed)
SYMPTOM MANAGEMENT CLINIC    Chief Complaint: Hypersensitivity reaction  HPI:  Nancy Norris 75 y.o. female diagnosed with multiple myeloma.  Currently undergoing Cytoxan and Kyproli chemotherapy regimen.s     No history exists.    Review of Systems  Constitutional: Positive for malaise/fatigue.  Skin: Positive for itching.  All other systems reviewed and are negative.   Past Medical History:  Diagnosis Date  . Anemia   . Anginal pain ([place])    used NTG x 2 May 31 and 06/15/13   . Anxiety   . Arthritis   . B12 deficiency 12/04/2014  . Breast cancer ([place])   . CKD (chronic kidney disease) stage 3, GFR 30-59 ml/min   . Complication of anesthesia   . COPD (chronic obstructive pulmonary disease) ([place])   . Depression   . Dizziness   . Dyspnea   . Fibromyalgia   . Fibromyalgia   . GERD (gastroesophageal reflux disease)   . Headache(784.0)   . Heart murmur   . Hemoptysis 10/21/2015  . History of blood transfusion    last one May 12   . Hx of cardiovascular stress test    LexiScan with low level exercise Myoview (02/2013): No ischemia, EF 72%; normal study  . Hx of echocardiogram    a.  Echocardiogram (12/26/2012): EF 19-14%, grade 1 diastolic dysfunction;   b.  Echocardiogram (02/2013): EF 55-60%, no WMA, trivial effusion  . Hyperkalemia   . Hyponatremia   . Hypothyroidism   . Mucositis   . Multiple myeloma   . Myocardial infarction    in past, patient was unaware.   . Neuropathy ([place])   . Pain in joint, pelvic region and thigh 07/07/2015  . PONV (postoperative nausea and vomiting) 2008   after mastestomy    Past Surgical History:  Procedure Laterality Date  . ABDOMINAL HYSTERECTOMY  1981  . AV FISTULA PLACEMENT Left 06/19/2013   Procedure: CREATION OF LEFT ARM ARTERIOVENOUS (AV) FISTULA ;  Surgeon: Angelia Mould, MD;  Location: [facility];  Service: Vascular;  Laterality: Left;  . BREAST RECONSTRUCTION    . BREAST SURGERY    . CATARACT EXTRACTION, BILATERAL     . CHOLECYSTECTOMY  1971  . EYE SURGERY Bilateral    lens implant  . history of Port removal    . LUNG BIOPSY  10/21/2015  . MASTECTOMY Left 2008  . PORTACATH PLACEMENT  12/2012   has had 2  . Status post stem cell transplant on September 28, 2008.      has Hypercalcemia; HX: breast cancer; Asthma, chronic; Hypothyroid; Chronic diastolic heart failure (HCC); HTN (hypertension); GERD (gastroesophageal reflux disease); COPD (chronic obstructive pulmonary disease) with emphysema ([place]); Fibromyalgia; Mixed hyperlipidemia; Prediabetes; Vitamin D deficiency; Medication management; Depression, controlled; Multiple myeloma ([place]); Orthostatic hypotension; CKD (chronic kidney disease) stage 5, GFR less than 15 ml/min (HCC); Encounter for antineoplastic chemotherapy; CHF (congestive heart failure) ([place]); Hypersensitivity reaction; Malnutrition of moderate degree; B12 deficiency; Thrombocytopenia ([place]); SOB (shortness of breath); Neoplastic malignant related fatigue; Anemia in neoplastic disease; Multiple myeloma not having achieved remission ([place]); Pain in joint, pelvic region and thigh; Right hip pain; Peripheral edema; Pneumonia; Nodule of left lung; Lung mass; and Hemoptysis on her problem list.    is allergic to codeine; latex; other; onion; zyprexa [olanzapine]; adhesive [tape]; hydrocodone; iodinated diagnostic agents; and sulfa antibiotics.    Medication List       Accurate as of 10/25/15 11:59 PM. Always use your most recent med list.  acetaminophen 325 MG tablet Commonly known as:  TYLENOL Take 650 mg by mouth every 6 (six) hours as needed for mild pain or moderate pain.   albuterol 108 (90 Base) MCG/ACT inhaler Commonly known as:  PROVENTIL HFA;VENTOLIN HFA Inhale 1-2 puffs into the lungs every 6 (six) hours as needed for wheezing or shortness of breath (cough).   ascorbic acid 250 MG Chew Commonly known as:  VITAMIN C Chew 250 mg by mouth daily.   azelastine 0.1 % nasal  spray Commonly known as:  ASTELIN Place 2 sprays into both nostrils 2 (two) times daily. Use in each nostril as directed   buPROPion 300 MG 24 hr tablet Commonly known as:  WELLBUTRIN XL Take 1 tablet (300 mg total) by mouth daily.   cetirizine 10 MG tablet Commonly known as:  ZYRTEC Take 10 mg by mouth daily.   Cholecalciferol 4000 units Tabs Take 4,000 Units by mouth daily.   citalopram 40 MG tablet Commonly known as:  CELEXA Take 1 tablet daily or as directed   cromolyn 4 % ophthalmic solution Commonly known as:  OPTICROM Place 1 drop into both eyes 4 (four) times daily.   ferrous sulfate 325 (65 FE) MG tablet Take 1 tablet daily or as directed   fluticasone 50 MCG/ACT nasal spray Commonly known as:  FLONASE Place 2 sprays into both nostrils daily.   fluticasone furoate-vilanterol 100-25 MCG/INH Aepb Commonly known as:  BREO ELLIPTA Inhale 1 puff into the lungs daily.   gabapentin 300 MG capsule Commonly known as:  NEURONTIN TAKE 1 CAPSULE BY MOUTH AT BEDTIME.   isosorbide mononitrate 30 MG 24 hr tablet Commonly known as:  IMDUR Take 1 tablet (30 mg total) by mouth daily.   levothyroxine 150 MCG tablet Commonly known as:  SYNTHROID, LEVOTHROID Take 1 tablet (150 mcg total) by mouth daily.   loratadine 10 MG tablet Commonly known as:  CLARITIN Take 10 mg by mouth daily.   LORazepam 0.5 MG tablet Commonly known as:  ATIVAN Take 1 tablet (0.5 mg total) by mouth every 8 (eight) hours as needed (nausea).   loteprednol 0.2 % Susp Commonly known as:  ALREX Place 1 drop into both eyes 4 (four) times daily.   meloxicam 15 MG tablet Commonly known as:  MOBIC Take 1 tablet (15 mg total) by mouth daily.   midodrine 10 MG tablet Commonly known as:  PROAMATINE TAKE 1 TABLET BY MOUTH 3 TIMES DAILY.   montelukast 10 MG tablet Commonly known as:  SINGULAIR Take 1 tablet (10 mg total) by mouth daily.   nitroGLYCERIN 0.4 MG SL tablet Commonly known as:   NITROSTAT Place 0.4 mg under the tongue every 5 (five) minutes as needed for chest pain. Reported on 03/30/2015   pantoprazole 40 MG tablet Commonly known as:  PROTONIX TAKE 1 TABLET DAILY FOR ACID REFLUX & INDIGESTION   pilocarpine 5 MG tablet Commonly known as:  SALAGEN Take 1 tablet 3 x / day for dry mouth.   PRESCRIPTION MEDICATION Pt gets chemo at [facility].   promethazine 25 MG tablet Commonly known as:  PHENERGAN Take 25 mg by mouth.   ranitidine 300 MG tablet Commonly known as:  ZANTAC Take 1 to 2 tablets daily for heartburn & reflux to allow wean and transition from PPI / pantoprazole   senna 8.6 MG tablet Commonly known as:  SENOKOT Take 1 tablet by mouth at bedtime.   Tiotropium Bromide Monohydrate 2.5 MCG/ACT Aers Commonly known as:  [place]  2 puffs into the lungs daily.   traMADol 50 MG tablet Commonly known as:  ULTRAM Take 1 to 2 tablets by mouth 4 times a day as needed for pain.        PHYSICAL EXAMINATION  Oncology Vitals 10/25/2015 10/22/2015  Height 165 cm -  Weight 71.124 kg -  Weight (lbs) 156 lbs 13 oz -  BMI (kg/m2) 26.09 kg/m2 -  Temp 98.2 -  Pulse 72 -  Resp 21 -  Resp (Historical as of 08/16/11) - -  SpO2 96 96  BSA (m2) 1.81 m2 -   BP Readings from Last 2 Encounters:  10/25/15 (!) 126/58  10/22/15 (!) 116/48    Physical Exam  Constitutional: She is oriented to person, place, and time and well-developed, well-nourished, and in no distress.  HENT:  Head: Normocephalic and atraumatic.  Eyes: Conjunctivae and EOM are normal. Pupils are equal, round, and reactive to light. Right eye exhibits no discharge. Left eye exhibits no discharge. No scleral icterus.  Neck: Normal range of motion.  Pulmonary/Chest: Effort normal. No stridor. No respiratory distress.  Musculoskeletal: Normal range of motion. She exhibits no edema or tenderness.  Neurological: She is alert and oriented to person, place, and time.  Skin: Skin is warm and  dry. No rash noted. No erythema.  Psychiatric: Affect normal.  Nursing note and vitals reviewed.   LABORATORY DATA:. Appointment on 10/25/2015  Component Date Value Ref Range Status  . WBC 10/25/2015 3.7* 3.9 - 10.3 10e3/uL Final  . NEUT# 10/25/2015 2.0  1.5 - 6.5 10e3/uL Final  . HGB 10/25/2015 9.4* 11.6 - 15.9 g/dL Final  . HCT 10/25/2015 28.7* 34.8 - 46.6 % Final  . Platelets 10/25/2015 87* 145 - 400 10e3/uL Final  . MCV 10/25/2015 107.1* 79.5 - 101.0 fL Final  . MCH 10/25/2015 35.1* 25.1 - 34.0 pg Final  . MCHC 10/25/2015 32.8  31.5 - 36.0 g/dL Final  . RBC 10/25/2015 2.68* 3.70 - 5.45 10e6/uL Final  . RDW 10/25/2015 18.2* 11.2 - 14.5 % Final  . lymph# 10/25/2015 0.9  0.9 - 3.3 10e3/uL Final  . MONO# 10/25/2015 0.7  0.1 - 0.9 10e3/uL Final  . Eosinophils Absolute 10/25/2015 0.1  0.0 - 0.5 10e3/uL Final  . Basophils Absolute 10/25/2015 0.0  0.0 - 0.1 10e3/uL Final  . NEUT% 10/25/2015 53.8  38.4 - 76.8 % Final  . LYMPH% 10/25/2015 24.5  14.0 - 49.7 % Final  . MONO% 10/25/2015 18.8* 0.0 - 14.0 % Final  . EOS% 10/25/2015 2.4  0.0 - 7.0 % Final  . BASO% 10/25/2015 0.5  0.0 - 2.0 % Final  . Sodium 10/25/2015 143  136 - 145 mEq/L Final  . Potassium 10/25/2015 3.6  3.5 - 5.1 mEq/L Final  . Chloride 10/25/2015 113* 98 - 109 mEq/L Final  . CO2 10/25/2015 20* 22 - 29 mEq/L Final  . Glucose 10/25/2015 109  70 - 140 mg/dl Final  . BUN 10/25/2015 28.0* 7.0 - 26.0 mg/dL Final  . Creatinine 10/25/2015 2.5* 0.6 - 1.1 mg/dL Final  . Total Bilirubin 10/25/2015 0.43  0.20 - 1.20 mg/dL Final  . Alkaline Phosphatase 10/25/2015 133  40 - 150 U/L Final  . AST 10/25/2015 25  5 - 34 U/L Final  . ALT 10/25/2015 35  0 - 55 U/L Final  . Total Protein 10/25/2015 5.9* 6.4 - 8.3 g/dL Final  . Albumin 10/25/2015 3.0* 3.5 - 5.0 g/dL Final  . Calcium 10/25/2015 9.2  8.4 - 10.4  mg/dL Final  . Anion Gap 10/25/2015 10  3 - 11 mEq/L Final  . EGFR 10/25/2015 18* >90 ml/min/1.73 m2 Final    RADIOGRAPHIC  STUDIES: No results found.  ASSESSMENT/PLAN:    Multiple myeloma [facility]) Patient presented to the [facility] today to receive cycle 11, day 1 of her Cytoxan/Kyprolis chemotherapy regimen.  She is scheduled to return tomorrow for cycle 11, day 2 of the same regimen.  She is scheduled to return for labs and chemotherapy again on 11/01/2015.  Hypersensitivity reaction Patient had already completed her Kyprolis infusion; and was in the midst of her Cytoxan chemotherapy when she developed sick significant, generalized pruritus.  She had no rash or hives noted whatsoever.  She also had no other symptoms whatsoever.  Infusion was held; and patient received Benadryl, Pepcid, and Solu-Medrol per protocol.  All symptoms resolve; and patient was able to complete all of her treatment today with no further issues.  Patient confirmed that she already has the printed instructions regarding use of both Benadryl and Pepcid on home to take for any mild reaction symptoms.  She was also encouraged to directly to the emergency department for any worsening symptoms whatsoever.      Patient stated understanding of all instructions; and was in agreement with this plan of care. The patient knows to call the clinic with any problems, questions or concerns.   Total time spent with patient was 15 minutes;  with greater than 75 percent of that time spent in face to face counseling regarding patient's symptoms,  and coordination of care and follow up.  Disclaimer:This dictation was prepared with Dragon/digital dictation along with [employer]. Any transcriptional errors that result from this process are unintentional.  Drue Second, NP 10/26/2015

## 2015-10-27 ENCOUNTER — Encounter: Payer: Self-pay | Admitting: Nurse Practitioner

## 2015-10-27 ENCOUNTER — Other Ambulatory Visit: Payer: Self-pay | Admitting: Medical Oncology

## 2015-10-27 ENCOUNTER — Ambulatory Visit (HOSPITAL_COMMUNITY)
Admission: RE | Admit: 2015-10-27 | Discharge: 2015-10-27 | Disposition: A | Discharge status: Home / Self Care | Payer: Medicare Other | Source: Ambulatory Visit | Attending: Cardiovascular Disease | Admitting: Cardiovascular Disease

## 2015-10-27 ENCOUNTER — Telehealth: Payer: Self-pay | Admitting: *Deleted

## 2015-10-27 DIAGNOSIS — R0602 Shortness of breath: Secondary | ICD-10-CM | POA: Diagnosis not present

## 2015-10-27 DIAGNOSIS — K121 Other forms of stomatitis: Secondary | ICD-10-CM

## 2015-10-27 MED ORDER — MAGIC MOUTHWASH: 5.0000 mL | Freq: Four times a day (QID) | ORAL | 1 refills | Status: AC | PRN

## 2015-10-27 NOTE — Progress Notes (Signed)
Pt contacted about magic mouthwash called in.

## 2015-10-27 NOTE — Telephone Encounter (Signed)
"  I was seen on Tuesday.  Dr. Julien Nordmann was to send MMW to [facility] on [address] but my pharmacy has not received an order yet."  Will notify provider.

## 2015-10-27 NOTE — Telephone Encounter (Signed)
MMW order and called to [facility]

## 2015-10-28 DIAGNOSIS — R079 Chest pain, unspecified: Secondary | ICD-10-CM | POA: Diagnosis not present

## 2015-10-28 DIAGNOSIS — I5030 Unspecified diastolic (congestive) heart failure: Secondary | ICD-10-CM | POA: Diagnosis not present

## 2015-10-28 DIAGNOSIS — D630 Anemia in neoplastic disease: Secondary | ICD-10-CM | POA: Diagnosis not present

## 2015-10-28 DIAGNOSIS — R911 Solitary pulmonary nodule: Secondary | ICD-10-CM | POA: Diagnosis not present

## 2015-10-28 DIAGNOSIS — R809 Proteinuria, unspecified: Secondary | ICD-10-CM | POA: Diagnosis not present

## 2015-10-28 DIAGNOSIS — N184 Chronic kidney disease, stage 4 (severe): Secondary | ICD-10-CM | POA: Diagnosis not present

## 2015-10-28 DIAGNOSIS — C9000 Multiple myeloma not having achieved remission: Secondary | ICD-10-CM | POA: Diagnosis not present

## 2015-10-28 DIAGNOSIS — I951 Orthostatic hypotension: Secondary | ICD-10-CM | POA: Diagnosis not present

## 2015-10-28 DIAGNOSIS — I770 Arteriovenous fistula, acquired: Secondary | ICD-10-CM | POA: Diagnosis not present

## 2015-10-28 DIAGNOSIS — N2581 Secondary hyperparathyroidism of renal origin: Secondary | ICD-10-CM | POA: Diagnosis not present

## 2015-10-31 ENCOUNTER — Other Ambulatory Visit: Payer: Self-pay | Admitting: Adult Health

## 2015-10-31 ENCOUNTER — Other Ambulatory Visit: Payer: Self-pay | Admitting: *Deleted

## 2015-10-31 DIAGNOSIS — R0602 Shortness of breath: Secondary | ICD-10-CM

## 2015-10-31 DIAGNOSIS — C9000 Multiple myeloma not having achieved remission: Secondary | ICD-10-CM

## 2015-10-31 NOTE — Progress Notes (Signed)
Called spoke with pt. Reviewed results and recs. Pt voiced understanding and had no further questions. VQ scan ordered

## 2015-10-31 NOTE — Progress Notes (Signed)
Called spoke with pt. Reviewed results and recs. Pt voiced understanding and had no further questions.

## 2015-11-01 ENCOUNTER — Telehealth: Payer: Self-pay | Admitting: *Deleted

## 2015-11-01 ENCOUNTER — Other Ambulatory Visit (HOSPITAL_BASED_OUTPATIENT_CLINIC_OR_DEPARTMENT_OTHER): Payer: Medicare Other

## 2015-11-01 ENCOUNTER — Ambulatory Visit (HOSPITAL_BASED_OUTPATIENT_CLINIC_OR_DEPARTMENT_OTHER): Payer: Medicare Other

## 2015-11-01 ENCOUNTER — Encounter (HOSPITAL_COMMUNITY): Payer: Self-pay

## 2015-11-01 VITALS — BP 136/69 | HR 65 | Temp 97.7°F | Resp 18

## 2015-11-01 DIAGNOSIS — Z5111 Encounter for antineoplastic chemotherapy: Secondary | ICD-10-CM

## 2015-11-01 DIAGNOSIS — Z5112 Encounter for antineoplastic immunotherapy: Secondary | ICD-10-CM | POA: Diagnosis not present

## 2015-11-01 DIAGNOSIS — C9000 Multiple myeloma not having achieved remission: Secondary | ICD-10-CM

## 2015-11-01 LAB — CBC WITH DIFFERENTIAL/PLATELET
BASO%: 0.2 % (ref 0.0–2.0)
Basophils Absolute: 0 10*3/uL (ref 0.0–0.1)
EOS ABS: 0.2 10*3/uL (ref 0.0–0.5)
EOS%: 3.3 % (ref 0.0–7.0)
HEMATOCRIT: 30 % — AB (ref 34.8–46.6)
HEMOGLOBIN: 9.7 g/dL — AB (ref 11.6–15.9)
LYMPH#: 1.4 10*3/uL (ref 0.9–3.3)
LYMPH%: 25 % (ref 14.0–49.7)
MCH: 34.8 pg — ABNORMAL HIGH (ref 25.1–34.0)
MCHC: 32.3 g/dL (ref 31.5–36.0)
MCV: 107.5 fL — AB (ref 79.5–101.0)
MONO#: 0.8 10*3/uL (ref 0.1–0.9)
MONO%: 14.2 % — ABNORMAL HIGH (ref 0.0–14.0)
NEUT%: 57.3 % (ref 38.4–76.8)
NEUTROS ABS: 3.3 10*3/uL (ref 1.5–6.5)
PLATELETS: 54 10*3/uL — AB (ref 145–400)
RBC: 2.79 10*6/uL — AB (ref 3.70–5.45)
RDW: 17.5 % — AB (ref 11.2–14.5)
WBC: 5.7 10*3/uL (ref 3.9–10.3)

## 2015-11-01 LAB — COMPREHENSIVE METABOLIC PANEL
ALBUMIN: 3.3 g/dL — AB (ref 3.5–5.0)
ALT: 26 U/L (ref 0–55)
ANION GAP: 11 meq/L (ref 3–11)
AST: 16 U/L (ref 5–34)
Alkaline Phosphatase: 151 U/L — ABNORMAL HIGH (ref 40–150)
BILIRUBIN TOTAL: 0.5 mg/dL (ref 0.20–1.20)
BUN: 38.4 mg/dL — ABNORMAL HIGH (ref 7.0–26.0)
CO2: 23 meq/L (ref 22–29)
CREATININE: 3.3 mg/dL — AB (ref 0.6–1.1)
Calcium: 9.9 mg/dL (ref 8.4–10.4)
Chloride: 106 mEq/L (ref 98–109)
EGFR: 13 mL/min/{1.73_m2} — AB (ref >90)
Glucose: 105 mg/dl (ref 70–140)
Potassium: 4.4 mEq/L (ref 3.5–5.1)
Sodium: 141 mEq/L (ref 136–145)
TOTAL PROTEIN: 6.2 g/dL — AB (ref 6.4–8.3)

## 2015-11-01 MED ORDER — DIPHENHYDRAMINE HCL 50 MG/ML IJ SOLN
INTRAMUSCULAR | Status: AC
  Filled 2015-11-01: qty 1

## 2015-11-01 MED ORDER — DEXAMETHASONE SODIUM PHOSPHATE 10 MG/ML IJ SOLN
10.0000 mg | Freq: Once | INTRAMUSCULAR | Status: AC
  Administered 2015-11-01: 10 mg via INTRAVENOUS

## 2015-11-01 MED ORDER — ONDANSETRON HCL 8 MG PO TABS
ORAL_TABLET | ORAL | Status: AC
  Filled 2015-11-01: qty 1

## 2015-11-01 MED ORDER — ONDANSETRON HCL 8 MG PO TABS
8.0000 mg | ORAL_TABLET | Freq: Once | ORAL | Status: AC
  Administered 2015-11-01: 8 mg via ORAL

## 2015-11-01 MED ORDER — SODIUM CHLORIDE 0.9 % IV SOLN: Freq: Once | INTRAVENOUS | Status: DC

## 2015-11-01 MED ORDER — SODIUM CHLORIDE 0.9 % IJ SOLN
10.0000 mL | INTRAMUSCULAR | Status: DC | PRN
  Administered 2015-11-01: 10 mL
  Filled 2015-11-01: qty 10

## 2015-11-01 MED ORDER — SODIUM CHLORIDE 0.9 % IV SOLN
225.0000 mg/m2 | Freq: Once | INTRAVENOUS | Status: AC
  Administered 2015-11-01: 420 mg via INTRAVENOUS
  Filled 2015-11-01: qty 21

## 2015-11-01 MED ORDER — SODIUM CHLORIDE 0.9 % IV SOLN
Freq: Once | INTRAVENOUS | Status: AC
  Administered 2015-11-01: 14:00:00 via INTRAVENOUS

## 2015-11-01 MED ORDER — HEPARIN SOD (PORK) LOCK FLUSH 100 UNIT/ML IV SOLN
500.0000 [IU] | Freq: Once | INTRAVENOUS | Status: AC | PRN
  Administered 2015-11-01: 500 [IU]
  Filled 2015-11-01: qty 5

## 2015-11-01 MED ORDER — DIPHENHYDRAMINE HCL 50 MG/ML IJ SOLN
50.0000 mg | Freq: Once | INTRAMUSCULAR | Status: AC
  Administered 2015-11-01: 50 mg via INTRAVENOUS

## 2015-11-01 MED ORDER — DEXTROSE 5 % IV SOLN
27.0000 mg/m2 | Freq: Once | INTRAVENOUS | Status: AC
  Administered 2015-11-01: 50 mg via INTRAVENOUS
  Filled 2015-11-01: qty 25

## 2015-11-01 MED ORDER — DEXAMETHASONE SODIUM PHOSPHATE 10 MG/ML IJ SOLN
INTRAMUSCULAR | Status: AC
  Filled 2015-11-01: qty 1

## 2015-11-01 NOTE — Telephone Encounter (Signed)
Call to Tammy , NP at Pulmonary advised per MD pt has fungal infection and will need further treatment. Tammy, NP verbalized understanding.

## 2015-11-01 NOTE — Progress Notes (Signed)
Reviewed pt labs (CBC and CMET) and pt to be treated with platelets of 54 and Creatine of 3.3. Per Dr. Julien Nordmann pt is to get 250 cc fluid bolus prior to Kyprolis. Pt stated that she has reacted to the Cytoxan twice now with itching. Verbal order received from Dr. Julien Nordmann for pt to receive 50 mg of Benadryl IV. Pt aware to watch fluid intake. Reviewed s/sx of fluid overload with pt who verbalized understanding and had no further questions.

## 2015-11-01 NOTE — Patient Instructions (Signed)
[facility] Discharge Instructions for Patients Receiving Chemotherapy  Today you received the following chemotherapy agents Kyprolis and Cytoxan  To help prevent nausea and vomiting after your treatment, we encourage you to take your nausea medication.   If you develop nausea and vomiting that is not controlled by your nausea medication, call the clinic.   BELOW ARE SYMPTOMS THAT SHOULD BE REPORTED IMMEDIATELY:  *FEVER GREATER THAN 100.5 F  *CHILLS WITH OR WITHOUT FEVER  NAUSEA AND VOMITING THAT IS NOT CONTROLLED WITH YOUR NAUSEA MEDICATION  *UNUSUAL SHORTNESS OF BREATH  *UNUSUAL BRUISING OR BLEEDING  TENDERNESS IN MOUTH AND THROAT WITH OR WITHOUT PRESENCE OF ULCERS  *URINARY PROBLEMS  *BOWEL PROBLEMS  UNUSUAL RASH Items with * indicate a potential emergency and should be followed up as soon as possible.  Feel free to call the clinic you have any questions or concerns. The clinic phone number is [phone].  Please show the CHEMO ALERT CARD at check-in to the Emergency Department and triage nurse.   

## 2015-11-02 ENCOUNTER — Telehealth: Payer: Self-pay | Admitting: Adult Health

## 2015-11-02 ENCOUNTER — Ambulatory Visit (HOSPITAL_BASED_OUTPATIENT_CLINIC_OR_DEPARTMENT_OTHER): Payer: Medicare Other

## 2015-11-02 ENCOUNTER — Telehealth: Payer: Self-pay | Admitting: Internal Medicine

## 2015-11-02 DIAGNOSIS — C9000 Multiple myeloma not having achieved remission: Secondary | ICD-10-CM

## 2015-11-02 DIAGNOSIS — Z5112 Encounter for antineoplastic immunotherapy: Secondary | ICD-10-CM | POA: Diagnosis not present

## 2015-11-02 MED ORDER — HEPARIN SOD (PORK) LOCK FLUSH 100 UNIT/ML IV SOLN
500.0000 [IU] | Freq: Once | INTRAVENOUS | Status: DC | PRN
  Filled 2015-11-02: qty 5

## 2015-11-02 MED ORDER — ONDANSETRON HCL 8 MG PO TABS
8.0000 mg | ORAL_TABLET | Freq: Once | ORAL | Status: AC
  Administered 2015-11-02: 8 mg via ORAL

## 2015-11-02 MED ORDER — SODIUM CHLORIDE 0.9 % IV SOLN
Freq: Once | INTRAVENOUS | Status: AC
Start: 2015-11-02 — End: 2015-11-02
  Administered 2015-11-02: 14:00:00 via INTRAVENOUS

## 2015-11-02 MED ORDER — SODIUM CHLORIDE 0.9 % IV SOLN
Freq: Once | INTRAVENOUS | Status: AC
  Administered 2015-11-02: 14:00:00 via INTRAVENOUS

## 2015-11-02 MED ORDER — DEXAMETHASONE SODIUM PHOSPHATE 10 MG/ML IJ SOLN
INTRAMUSCULAR | Status: AC
  Filled 2015-11-02: qty 1

## 2015-11-02 MED ORDER — DEXTROSE 5 % IV SOLN
27.0000 mg/m2 | Freq: Once | INTRAVENOUS | Status: AC
  Administered 2015-11-02: 50 mg via INTRAVENOUS
  Filled 2015-11-02: qty 25

## 2015-11-02 MED ORDER — ONDANSETRON HCL 8 MG PO TABS
ORAL_TABLET | ORAL | Status: AC
  Filled 2015-11-02: qty 1

## 2015-11-02 MED ORDER — DEXAMETHASONE SODIUM PHOSPHATE 10 MG/ML IJ SOLN
10.0000 mg | Freq: Once | INTRAMUSCULAR | Status: AC
  Administered 2015-11-02: 10 mg via INTRAVENOUS

## 2015-11-02 MED ORDER — SODIUM CHLORIDE 0.9 % IJ SOLN
10.0000 mL | INTRAMUSCULAR | Status: DC | PRN
  Administered 2015-11-02: 10 mL
  Filled 2015-11-02: qty 10

## 2015-11-02 NOTE — Telephone Encounter (Signed)
Spoke with pt. She has several questions to ask TP about her biopsy. Pt would like TP to call her.  TP - please advise. Thanks.

## 2015-11-02 NOTE — Patient Instructions (Signed)
[facility] Discharge Instructions for Patients Receiving Chemotherapy  Today you received the following chemotherapy agents Kyprolis.  To help prevent nausea and vomiting after your treatment, we encourage you to take your nausea medication as prescribed.   If you develop nausea and vomiting that is not controlled by your nausea medication, call the clinic.   BELOW ARE SYMPTOMS THAT SHOULD BE REPORTED IMMEDIATELY:  *FEVER GREATER THAN 100.5 F  *CHILLS WITH OR WITHOUT FEVER  NAUSEA AND VOMITING THAT IS NOT CONTROLLED WITH YOUR NAUSEA MEDICATION  *UNUSUAL SHORTNESS OF BREATH  *UNUSUAL BRUISING OR BLEEDING  TENDERNESS IN MOUTH AND THROAT WITH OR WITHOUT PRESENCE OF ULCERS  *URINARY PROBLEMS  *BOWEL PROBLEMS  UNUSUAL RASH Items with * indicate a potential emergency and should be followed up as soon as possible.  Feel free to call the clinic you have any questions or concerns. The clinic phone number is [phone].  Please show the CHEMO ALERT CARD at check-in to the Emergency Department and triage nurse.   

## 2015-11-02 NOTE — Progress Notes (Signed)
Patient to take Fiorcet as directed for migraine headaches and to use nasal saline drops to minimize nasal dryness, to decrease incidents of nose bleeds, per Dr. Julien Nordmann.  Patient verbalized understanding.

## 2015-11-03 ENCOUNTER — Telehealth: Payer: Self-pay | Admitting: Internal Medicine

## 2015-11-03 ENCOUNTER — Other Ambulatory Visit: Payer: Self-pay | Admitting: Internal Medicine

## 2015-11-03 DIAGNOSIS — B459 Cryptococcosis, unspecified: Secondary | ICD-10-CM

## 2015-11-03 NOTE — Addendum Note (Signed)
Addended by: Desmond Dike C on: 11/03/2015 09:11 AM   Modules accepted: Orders

## 2015-11-03 NOTE — Telephone Encounter (Signed)
She returned my call from yesterday. Lung needle bx of L lung nodule is positive for Crypto by pathology report- not in micro culture results. She agrees to referral to ID.  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

## 2015-11-03 NOTE — Telephone Encounter (Signed)
Referral has been placed per CY.

## 2015-11-03 NOTE — Telephone Encounter (Signed)
Dr. Annamaria Boots spoke with pt. Referred to ID

## 2015-11-04 ENCOUNTER — Encounter (HOSPITAL_COMMUNITY)
Admission: RE | Admit: 2015-11-04 | Discharge: 2015-11-04 | Disposition: A | Discharge status: Home / Self Care | Payer: Medicare Other | Source: Ambulatory Visit | Attending: Adult Health | Admitting: Adult Health

## 2015-11-04 ENCOUNTER — Ambulatory Visit (HOSPITAL_COMMUNITY)
Admission: RE | Admit: 2015-11-04 | Discharge: 2015-11-04 | Disposition: A | Discharge status: Home / Self Care | Payer: Medicare Other | Source: Ambulatory Visit | Attending: Adult Health | Admitting: Adult Health

## 2015-11-04 DIAGNOSIS — R0602 Shortness of breath: Secondary | ICD-10-CM | POA: Diagnosis not present

## 2015-11-04 DIAGNOSIS — R918 Other nonspecific abnormal finding of lung field: Secondary | ICD-10-CM | POA: Diagnosis not present

## 2015-11-04 MED ORDER — TECHNETIUM TO 99M ALBUMIN AGGREGATED
4.3000 | Freq: Once | INTRAVENOUS | Status: AC | PRN
  Administered 2015-11-04: 4.3 via INTRAVENOUS

## 2015-11-04 MED ORDER — TECHNETIUM TC 99M DIETHYLENETRIAME-PENTAACETIC ACID: 33.0000 | Freq: Once | INTRAVENOUS | Status: DC | PRN

## 2015-11-04 NOTE — Progress Notes (Signed)
Called spoke with pt. Reviewed results and recs. Pt voiced understanding and had no further questions.

## 2015-11-07 ENCOUNTER — Telehealth: Payer: Self-pay | Admitting: Internal Medicine

## 2015-11-07 ENCOUNTER — Other Ambulatory Visit: Payer: Self-pay | Admitting: Medical Oncology

## 2015-11-07 DIAGNOSIS — N189 Chronic kidney disease, unspecified: Secondary | ICD-10-CM

## 2015-11-07 DIAGNOSIS — C9000 Multiple myeloma not having achieved remission: Secondary | ICD-10-CM

## 2015-11-07 MED ORDER — FLUCONAZOLE 200 MG PO TABS: 200.0000 mg | ORAL_TABLET | Freq: Every day | ORAL | 1 refills | Status: DC

## 2015-11-07 NOTE — Telephone Encounter (Signed)
Order Rx fluconazole 200 mg, # 30, 1 daily,    Ref x 1  Please also order outpatient lab-  BMET, Hepatic function panel     Dx chronic kidney disease

## 2015-11-07 NOTE — Telephone Encounter (Signed)
Called and spoke with pt and she stated that she called to get an appt with ID and she has to wait until 11/27 to be seen by them.  She is very upset about this.  She has some concerns about waiting that long for the appt.  she wanted to know: 1. Can this fungus spread to the other lung? 2. Is ID here the only people that she can see/be treated by for this? 3. Can anyone else give her any meds to help clear this up?  CY please advise. Thanks  Last ov--01/03/15 Next ov--01/04/16  Allergies  Allergen Reactions  . Codeine Anaphylaxis  . Latex Shortness Of Breath    Adhesive products   . Other Other (See Comments)    Onion, chocolate causes migraines  . Onion Other (See Comments)    Causes migraine headaches  . Zyprexa [Olanzapine] Other (See Comments)    Confusion , dizzy,unsteady  . Adhesive [Tape] Other (See Comments)    blisters  . Hydrocodone Rash    With extreme itching  . Iodinated Diagnostic Agents Itching, Rash and Hives    Happened 60 years ago  . Sulfa Antibiotics Itching

## 2015-11-07 NOTE — Telephone Encounter (Signed)
Spoke with pt. She is aware of CY's recommendations. Rx has been sent in. Orders have been placed for labs. Nothing further was needed.

## 2015-11-08 ENCOUNTER — Other Ambulatory Visit (INDEPENDENT_AMBULATORY_CARE_PROVIDER_SITE_OTHER): Payer: Medicare Other

## 2015-11-08 ENCOUNTER — Other Ambulatory Visit (HOSPITAL_BASED_OUTPATIENT_CLINIC_OR_DEPARTMENT_OTHER): Payer: Medicare Other

## 2015-11-08 ENCOUNTER — Ambulatory Visit: Payer: Medicare Other

## 2015-11-08 ENCOUNTER — Ambulatory Visit (HOSPITAL_BASED_OUTPATIENT_CLINIC_OR_DEPARTMENT_OTHER): Payer: Medicare Other | Admitting: Oncology

## 2015-11-08 ENCOUNTER — Encounter: Payer: Self-pay | Admitting: Oncology

## 2015-11-08 VITALS — BP 121/58 | HR 75 | Temp 97.9°F | Resp 18 | Ht 65.0 in | Wt 156.5 lb

## 2015-11-08 DIAGNOSIS — N189 Chronic kidney disease, unspecified: Secondary | ICD-10-CM

## 2015-11-08 DIAGNOSIS — R918 Other nonspecific abnormal finding of lung field: Secondary | ICD-10-CM

## 2015-11-08 DIAGNOSIS — C9000 Multiple myeloma not having achieved remission: Secondary | ICD-10-CM | POA: Diagnosis not present

## 2015-11-08 DIAGNOSIS — D631 Anemia in chronic kidney disease: Secondary | ICD-10-CM | POA: Diagnosis not present

## 2015-11-08 DIAGNOSIS — Z5111 Encounter for antineoplastic chemotherapy: Secondary | ICD-10-CM

## 2015-11-08 LAB — COMPREHENSIVE METABOLIC PANEL
ALBUMIN: 3.3 g/dL — AB (ref 3.5–5.0)
ALK PHOS: 143 U/L (ref 40–150)
ALT: 19 U/L (ref 0–55)
ANION GAP: 11 meq/L (ref 3–11)
AST: 18 U/L (ref 5–34)
BILIRUBIN TOTAL: 0.63 mg/dL (ref 0.20–1.20)
BUN: 47 mg/dL — AB (ref 7.0–26.0)
CALCIUM: 9.8 mg/dL (ref 8.4–10.4)
CHLORIDE: 109 meq/L (ref 98–109)
CO2: 20 mEq/L — ABNORMAL LOW (ref 22–29)
CREATININE: 3.2 mg/dL — AB (ref 0.6–1.1)
EGFR: 14 mL/min/{1.73_m2} — ABNORMAL LOW (ref >90)
Glucose: 100 mg/dl (ref 70–140)
Potassium: 4 mEq/L (ref 3.5–5.1)
Sodium: 140 mEq/L (ref 136–145)
Total Protein: 6 g/dL — ABNORMAL LOW (ref 6.4–8.3)

## 2015-11-08 LAB — CBC WITH DIFFERENTIAL/PLATELET
BASO%: 0.6 % (ref 0.0–2.0)
BASOS ABS: 0 10*3/uL (ref 0.0–0.1)
EOS%: 4.9 % (ref 0.0–7.0)
Eosinophils Absolute: 0.2 10*3/uL (ref 0.0–0.5)
HEMATOCRIT: 28.5 % — AB (ref 34.8–46.6)
HEMOGLOBIN: 9.3 g/dL — AB (ref 11.6–15.9)
LYMPH#: 0.9 10*3/uL (ref 0.9–3.3)
LYMPH%: 21.9 % (ref 14.0–49.7)
MCH: 35.1 pg — ABNORMAL HIGH (ref 25.1–34.0)
MCHC: 32.6 g/dL (ref 31.5–36.0)
MCV: 107.7 fL — ABNORMAL HIGH (ref 79.5–101.0)
MONO#: 0.8 10*3/uL (ref 0.1–0.9)
MONO%: 19.2 % — ABNORMAL HIGH (ref 0.0–14.0)
NEUT#: 2.1 10*3/uL (ref 1.5–6.5)
NEUT%: 53.4 % (ref 38.4–76.8)
PLATELETS: 46 10*3/uL — AB (ref 145–400)
RBC: 2.65 10*6/uL — ABNORMAL LOW (ref 3.70–5.45)
RDW: 18.6 % — AB (ref 11.2–14.5)
WBC: 4 10*3/uL (ref 3.9–10.3)

## 2015-11-08 LAB — BASIC METABOLIC PANEL
BUN: 49 mg/dL — ABNORMAL HIGH (ref 6–23)
CALCIUM: 10.1 mg/dL (ref 8.4–10.5)
CO2: 24 mEq/L (ref 19–32)
Chloride: 106 mEq/L (ref 96–112)
Creatinine, Ser: 3.14 mg/dL — ABNORMAL HIGH (ref 0.40–1.20)
GFR: 15.38 mL/min — AB (ref >60.00)
Glucose, Bld: 97 mg/dL (ref 70–99)
POTASSIUM: 3.9 meq/L (ref 3.5–5.1)
SODIUM: 139 meq/L (ref 135–145)

## 2015-11-08 LAB — HEPATIC FUNCTION PANEL
ALBUMIN: 4 g/dL (ref 3.5–5.2)
ALK PHOS: 131 U/L — AB (ref 39–117)
ALT: 18 U/L (ref 0–35)
AST: 15 U/L (ref 0–37)
BILIRUBIN DIRECT: 0.2 mg/dL (ref 0.0–0.3)
BILIRUBIN TOTAL: 0.7 mg/dL (ref 0.2–1.2)
Total Protein: 6.2 g/dL (ref 6.0–8.3)

## 2015-11-08 MED ORDER — NYSTATIN 100000 UNIT/GM EX POWD: Freq: Two times a day (BID) | CUTANEOUS | 0 refills | Status: DC

## 2015-11-08 NOTE — Progress Notes (Signed)
[place] Telephone:([phone]) [phone]   Fax:([phone]) [place], MD [address] [place] [address]  PRINCIPAL DIAGNOSES:  1. Large left upper lobe lung mass, pathology consistent with cryptococcal infection 2. Recurrent multiple myeloma initially diagnosed in 2002, initially with smoldering myeloma at [facility].  3. Ductal carcinoma in situ status post mastectomy with sentinel lymph node biopsy in October 2008.  PRIOR THERAPY:  1. Status post treatment with tamoxifen from November 2008 through February 2009, discontinued secondary to intolerance. 2. Status post 3 cycles of chemotherapy with Revlimid and Decadron followed by 1 cycle of Decadron only with mild response. 3. Status post 2 cycles of systemic chemotherapy with Velcade, Doxil and Decadron discontinued secondary to significant peripheral neuropathy. Last dose was given May 2010 at [facility]. 4. Status post autologous peripheral blood stem cell transplant on October 01, 2008 at [facility] under the care of Dr. Phyllis Ginger.  5. Systemic chemotherapy with Carfilzomib initially was 20 mg/M2 and will be increased after cycle #1 to 36 mg/M2 on days 1, 2, 8, 9, 15 and 16 every 4 weeks in addition to Cytoxan 300 mg/M2 and Decadron 40 mg by mouth weekly basis, status post 4 cycles. First cycle on 12/29/2012. 6. She resumed chemotherapy with Carfilzomib, Cytoxan, and Decadron on 05/03/2014. Status post 6 cycles.  CURRENT THERAPY: The patient resumed her systemic chemotherapy again with Carfilzomib, Cytoxan and Decadron on 07/11/2015. Status post 3 cycles.  INTERVAL HISTORY: Nancy Norris 75 y.o. female returns to the clinic today for follow-up visit by herself. She continues to complain of fatigue and shortness breath with exertion. She also has intermittent left-sided chest wall pain. She was recently prescribed fluconazole  for the cryptococcal infection and plans to start later today. A referral to infectious disease is pending at this time. She tolerated the last cycle of her systemic chemotherapy fairly well except that she developed itching during the Cytoxan infusion. This improved with premedication of Benadryl and dexamethasone. She denied having any fever or chills today. She has cough or hemoptysis. She denied having any nausea or vomiting. No significant weight loss or night sweats. She is here today for evaluation for continuation of cycle #4.  MEDICAL HISTORY: Past Medical History:  Diagnosis Date  . Anemia   . Anginal pain ([place])    used NTG x 2 May 31 and 06/15/13   . Anxiety   . Arthritis   . B12 deficiency 12/04/2014  . Breast cancer ([place])   . CKD (chronic kidney disease) stage 3, GFR 30-59 ml/min   . Complication of anesthesia   . COPD (chronic obstructive pulmonary disease) ([place])   . Depression   . Dizziness   . Dyspnea   . Fibromyalgia   . Fibromyalgia   . GERD (gastroesophageal reflux disease)   . Headache(784.0)   . Heart murmur   . Hemoptysis 10/21/2015  . History of blood transfusion    last one May 12   . Hx of cardiovascular stress test    LexiScan with low level exercise Myoview (02/2013): No ischemia, EF 72%; normal study  . Hx of echocardiogram    a.  Echocardiogram (12/26/2012): EF 25-85%, grade 1 diastolic dysfunction;   b.  Echocardiogram (02/2013): EF 55-60%, no WMA, trivial effusion  . Hyperkalemia   . Hyponatremia   . Hypothyroidism   . Mucositis   . Multiple myeloma   . Myocardial infarction  in past, patient was unaware.   . Neuropathy ([place])   . Pain in joint, pelvic region and thigh 07/07/2015  . PONV (postoperative nausea and vomiting) 2008   after mastestomy    ALLERGIES:  is allergic to codeine; latex; other; onion; zyprexa [olanzapine]; adhesive [tape]; hydrocodone; iodinated diagnostic agents; and sulfa antibiotics.  MEDICATIONS:  Current Outpatient  Prescriptions  Medication Sig Dispense Refill  . acetaminophen (TYLENOL) 325 MG tablet Take 650 mg by mouth every 6 (six) hours as needed for mild pain or moderate pain.     Marland Kitchen albuterol (PROVENTIL HFA;VENTOLIN HFA) 108 (90 Base) MCG/ACT inhaler Inhale 1-2 puffs into the lungs every 6 (six) hours as needed for wheezing or shortness of breath (cough). 1 Inhaler 3  . azelastine (ASTELIN) 0.1 % nasal spray Place 2 sprays into both nostrils 2 (two) times daily. Use in each nostril as directed 30 mL 2  . buPROPion (WELLBUTRIN XL) 300 MG 24 hr tablet Take 1 tablet (300 mg total) by mouth daily. 90 tablet 1  . butalbital-acetaminophen-caffeine (FIORICET, ESGIC) 50-325-40 MG tablet TAKE 1 OR 2 TABLETS BY MOUTH EVERY 4 HOURS AS NEEDED FOR PAIN OR HEADACHE. 50 tablet 3  . cetirizine (ZYRTEC) 10 MG tablet Take 10 mg by mouth daily.     . Cholecalciferol 4000 UNITS TABS Take 4,000 Units by mouth daily.    . citalopram (CELEXA) 40 MG tablet Take 1 tablet daily or as directed 90 tablet 1  . cromolyn (OPTICROM) 4 % ophthalmic solution Place 1 drop into both eyes 4 (four) times daily.    . ferrous sulfate 325 (65 FE) MG tablet Take 1 tablet daily or as directed 90 tablet 1  . fluconazole (DIFLUCAN) 200 MG tablet Take 1 tablet (200 mg total) by mouth daily. 30 tablet 1  . fluticasone (FLONASE) 50 MCG/ACT nasal spray Place 2 sprays into both nostrils daily. 16 g 0  . fluticasone furoate-vilanterol (BREO ELLIPTA) 100-25 MCG/INH AEPB Inhale 1 puff into the lungs daily. 28 each 0  . gabapentin (NEURONTIN) 300 MG capsule TAKE 1 CAPSULE BY MOUTH AT BEDTIME. 90 capsule 1  . isosorbide mononitrate (IMDUR) 30 MG 24 hr tablet Take 1 tablet (30 mg total) by mouth daily. 90 tablet 3  . levothyroxine (SYNTHROID, LEVOTHROID) 150 MCG tablet Take 1 tablet (150 mcg total) by mouth daily. (Patient taking differently: Take 150 mcg by mouth daily. ) 90 tablet 1  . loratadine (CLARITIN) 10 MG tablet Take 10 mg by mouth daily.    Marland Kitchen  LORazepam (ATIVAN) 0.5 MG tablet Take 1 tablet (0.5 mg total) by mouth every 8 (eight) hours as needed (nausea). 30 tablet 0  . loteprednol (ALREX) 0.2 % SUSP Place 1 drop into both eyes 4 (four) times daily. 1 Bottle PRN  . magic mouthwash SOLN Take 5 mLs by mouth 4 (four) times daily as needed for mouth pain. 240 mL 1  . meloxicam (MOBIC) 15 MG tablet Take 1 tablet (15 mg total) by mouth daily. 90 tablet 3  . midodrine (PROAMATINE) 10 MG tablet TAKE 1 TABLET BY MOUTH 3 TIMES DAILY. 270 tablet 3  . montelukast (SINGULAIR) 10 MG tablet Take 1 tablet (10 mg total) by mouth daily. 30 tablet 2  . nitroGLYCERIN (NITROSTAT) 0.4 MG SL tablet Place 0.4 mg under the tongue every 5 (five) minutes as needed for chest pain. Reported on 03/30/2015    . pantoprazole (PROTONIX) 40 MG tablet TAKE 1 TABLET DAILY FOR ACID REFLUX & INDIGESTION 90 tablet  1  . pilocarpine (SALAGEN) 5 MG tablet Take 1 tablet 3 x / day for dry mouth. 270 tablet 1  . PRESCRIPTION MEDICATION Pt gets chemo at [facility].    Marland Kitchen promethazine (PHENERGAN) 25 MG tablet Take 25 mg by mouth.    . ranitidine (ZANTAC) 300 MG tablet Take 1 to 2 tablets daily for heartburn & reflux to allow wean and transition from PPI / pantoprazole (Patient taking differently: Take 300 mg by mouth at bedtime. Take 1 to 2 tablets daily for heartburn & reflux to allow wean and transition from PPI / pantoprazole) 180 tablet 3  . senna (SENOKOT) 8.6 MG tablet Take 1 tablet by mouth at bedtime.    . Tiotropium Bromide Monohydrate (SPIRIVA RESPIMAT) 2.5 MCG/ACT AERS Inhale 2 puffs into the lungs daily. 1 Inhaler 5  . traMADol (ULTRAM) 50 MG tablet Take 1 to 2 tablets by mouth 4 times a day as needed for pain. 180 tablet 0  . vitamin C (ASCORBIC ACID) 250 MG tablet Take 250 mg by mouth daily.    Marland Kitchen nystatin (MYCOSTATIN/NYSTOP) powder Apply topically 2 (two) times daily. 30 g 0   No current facility-administered medications for this visit.    Facility-Administered Medications  Ordered in Other Visits  Medication Dose Route Frequency Provider Last Rate Last Dose  . 0.9 %  sodium chloride infusion   Intravenous Once Adrena E Johnson, PA-C      . acetaminophen (TYLENOL) tablet 650 mg  650 mg Oral Once Susanne Borders, NP      . sodium chloride 0.9 % injection 10 mL  10 mL Intracatheter PRN Curt Bears, MD   10 mL at 01/05/13 1825  . sodium chloride 0.9 % injection 10 mL  10 mL Intracatheter PRN Curt Bears, MD   10 mL at 06/15/14 1514  . technetium TC 59M diethylenetriame-pentaacetic acid (DTPA) injection 33 millicurie  33 millicurie Intravenous Once PRN Sharyn Blitz, MD        SURGICAL HISTORY:  Past Surgical History:  Procedure Laterality Date  . ABDOMINAL HYSTERECTOMY  1981  . AV FISTULA PLACEMENT Left 06/19/2013   Procedure: CREATION OF LEFT ARM ARTERIOVENOUS (AV) FISTULA ;  Surgeon: Angelia Mould, MD;  Location: [place];  Service: Vascular;  Laterality: Left;  . BREAST RECONSTRUCTION    . BREAST SURGERY    . CATARACT EXTRACTION, BILATERAL    . CHOLECYSTECTOMY  1971  . EYE SURGERY Bilateral    lens implant  . history of Port removal    . LUNG BIOPSY  10/21/2015  . MASTECTOMY Left 2008  . PORTACATH PLACEMENT  12/2012   has had 2  . Status post stem cell transplant on September 28, 2008.      REVIEW OF SYSTEMS:  Constitutional: positive for fatigue Eyes: negative Ears, nose, mouth, throat, and face: negative Respiratory: positive for pleurisy/chest pain Cardiovascular: negative Gastrointestinal: negative Genitourinary:negative Integument/breast: negative Hematologic/lymphatic: negative Musculoskeletal:negative Neurological: negative Behavioral/Psych: negative Endocrine: negative Allergic/Immunologic: negative   PHYSICAL EXAMINATION: General appearance: alert, cooperative and no distress Head: Normocephalic, without obvious abnormality, atraumatic Neck: no adenopathy, no JVD, supple, symmetrical, trachea midline and thyroid  not enlarged, symmetric, no tenderness/mass/nodules Lymph nodes: Cervical, supraclavicular, and axillary nodes normal. Resp: clear to auscultation bilaterally Back: symmetric, no curvature. ROM normal. No CVA tenderness. Cardio: regular rate and rhythm, S1, S2 normal, no murmur, click, rub or gallop GI: soft, non-tender; bowel sounds normal; no masses,  no organomegaly Extremities: extremities normal, atraumatic, no cyanosis or edema  Neurologic: Alert and oriented X 3, normal strength and tone. Normal symmetric reflexes. Normal coordination and gait  ECOG PERFORMANCE STATUS: 1 - Symptomatic but completely ambulatory  Blood pressure (!) 121/58, pulse 75, temperature 97.9 F (36.6 C), temperature source Oral, resp. rate 18, height 5' 5" (1.651 m), weight 156 lb 8 oz (71 kg), SpO2 100 %.  LABORATORY DATA: Lab Results  Component Value Date   WBC 4.0 11/08/2015   HGB 9.3 (L) 11/08/2015   HCT 28.5 (L) 11/08/2015   MCV 107.7 (H) 11/08/2015   PLT 46 (L) 11/08/2015      Chemistry      Component Value Date/Time   NA 140 11/08/2015 0827   K 4.0 11/08/2015 0827   CL 109 10/20/2015 1647   CL 105 03/19/2012 0811   CO2 20 (L) 11/08/2015 0827   BUN 47.0 (H) 11/08/2015 0827   CREATININE 3.2 (HH) 11/08/2015 0827      Component Value Date/Time   CALCIUM 9.8 11/08/2015 0827   ALKPHOS 143 11/08/2015 0827   AST 18 11/08/2015 0827   ALT 19 11/08/2015 0827   BILITOT 0.63 11/08/2015 0827     RADIOGRAPHIC STUDIES: Dg Chest 1 View  Result Date: 10/21/2015 CLINICAL DATA:  Hemoptysis and chest pain. History of COPD. Status post biopsy of a left lung mass today. EXAM: CHEST 1 VIEW COMPARISON:  PET CT scan 10/11/2015. FINDINGS: There is no pneumothorax. Left chest mass is again seen. The lungs are emphysematous. No pleural effusion. Hiatal hernia noted. IMPRESSION: Negative for pneumothorax after biopsy.  No acute disease. Electronically Signed   By: Inge Rise M.D.   On: 10/21/2015 12:55   Dg  Chest 2 View  Result Date: 11/04/2015 CLINICAL DATA:  Shortness of breath. EXAM: CHEST  2 VIEW COMPARISON:  10/22/2015.  CT chest 10/21/2015.  PET-CT 10/11/2015. FINDINGS: PowerPort catheter noted with tip projected over cavoatrial junction. Stable cardiomegaly. Mass lesion noted in the left mid lung is is again noted. This mass lesion may be slightly smaller on today's exam. No prominent pleural effusion or pneumothorax. Degenerative changes thoracic spine . IMPRESSION: 1. Power port catheter in stable position. 2. Mass lesion again noted in the left mid lung. The mass lesion may be slightly smaller on today's exam . Electronically Signed   By: [place]   On: 11/04/2015 13:31   X-ray Chest Pa And Lateral  Result Date: 10/22/2015 CLINICAL DATA:  Hemoptysis. EXAM: CHEST  2 VIEW COMPARISON:  Chest radiograph October 21, 2015 and PET-CT October 11, 2015 FINDINGS: Mass remains in the left mid lung, measuring 4.5 x 3.3 x 3.6 cm. The borders of this mass appears somewhat ill-defined, likely due to recent biopsy. There is no edema or consolidation. The heart size and pulmonary vascularity are normal. Port-A-Cath tip is at the cavoatrial junction. No pneumothorax. There is atherosclerotic calcification in the aorta. No adenopathy is apparent by radiography. No evident bone lesions. IMPRESSION: Left mid lung region mass noted with ill-defined borders, likely due to hemorrhage from recent biopsy. No new opacity. Stable cardiac silhouette. There is aortic atherosclerosis. No pneumothorax evident. Electronically Signed   By: Lowella Grip III M.D.   On: 10/22/2015 07:50   Nm Pulmonary Per & Vent  Result Date: 11/04/2015 CLINICAL DATA:  Eov.SOB, CHEST TIGHTNESS X 4-5 MTHS NO HX PE OR DVT EXAM: NUCLEAR MEDICINE VENTILATION - PERFUSION LUNG SCAN TECHNIQUE: Ventilation images were obtained in multiple projections using inhaled aerosol Tc-31mDTPA. Perfusion images were obtained in multiple projections  after  intravenous injection of Tc-32mMAA. RADIOPHARMACEUTICALS:  33.0 MCI TC-51M DTPA AEROSOL INHALED/JLL/GHT 4.3 MCI TC-51M MAA RAC IV/JLL COMPARISON:  Radiograph from earlier the same day and previous studies FINDINGS: Ventilation: No focal ventilation defect. Mild paucity of activity in the upper lobes bilaterally. Perfusion: Heterogeneous distribution of radiopharmaceutical. No wedge shaped peripheral perfusion defects to suggest acute pulmonary embolism. IMPRESSION: 1. Low likelihood ratio for pulmonary embolism. Electronically Signed   By: DLucrezia EuropeM.D.   On: 11/04/2015 14:54   Nm Pet Image Initial (pi) Skull Base To Thigh  Result Date: 10/11/2015 CLINICAL DATA:  Initial treatment strategy for left lung mass. Personal history of breast carcinoma. EXAM: NUCLEAR MEDICINE PET SKULL BASE TO THIGH TECHNIQUE: 8.0 mCi F-18 FDG was injected intravenously. Full-ring PET imaging was performed from the skull base to thigh after the radiotracer. CT data was obtained and used for attenuation correction and anatomic localization. FASTING BLOOD GLUCOSE:  Value: 118 mg/dl COMPARISON:  Chest CT on 09/26/2015 FINDINGS: NECK No hypermetabolic lymph nodes in the neck. Hypermetabolic mucosal thickening is noted within the right maxillary sinus, consistent with sinusitis. CHEST No hypermetabolic mediastinal or hilar nodes. Previous left mastectomy and axillary lymph node dissection. No evidence of hypermetabolic axillary lymph nodes. 4.2 x 3.8 cm ill-defined mass is seen in the posterior lingula on image 37/8 which is further increase in size compared to previous study when it measured 3.8 x 3.0 cm. This has SUV max of 19.7. No other suspicious pulmonary nodules or masses seen on CT images. Mild emphysema noted. No evidence of pleural effusion. ABDOMEN/PELVIS No abnormal hypermetabolic activity within the liver, pancreas, adrenal glands, or spleen. 1.6 cm left adrenal mass is stable compared to previous abdomen CT on 11/23/2013,  and shows absence of FDG activity previous consistent with a benign adrenal adenoma. No hypermetabolic lymph nodes in the abdomen or pelvis. Aortic atherosclerosis. Mild diverticulosis is seen involving the sigmoid colon, without evidence of diverticulitis. SKELETON No focal hypermetabolic activity to suggest skeletal metastasis. IMPRESSION: 4.2 cm hypermetabolic of mass within the lingula, highly suspicious for primary bronchogenic carcinoma. No evidence of thoracic lymph node or distant metastatic disease. Stable benign left adrenal adenoma. Electronically Signed   By: JEarle GellM.D.   On: 10/11/2015 13:43   Ct Biopsy  Result Date: 10/21/2015 CLINICAL DATA:  4 cm left upper lobe/ lingular lung mass demonstrating abnormal elevated hypermetabolism by PET scan. The patient presents for biopsy. She has a history of multiple myeloma and is currently undergoing treatment with chronic thrombocytopenia. Platelet count today was 69. At this level, it was felt safe to proceed with biopsy. EXAM: CT GUIDED CORE BIOPSY OF BODY PART ANESTHESIA/SEDATION: 1.0 mg IV Versed; 50 mcg IV Fentanyl Total Moderate Sedation Time:  40 minutes. The patient's level of consciousness and physiologic status were continuously monitored during the procedure by Radiology nursing. PROCEDURE: The procedure risks, benefits, and alternatives were explained to the patient. Questions regarding the procedure were encouraged and answered. The patient understands and consents to the procedure. A time-out was performed prior to initiating the procedure. The left lateral chest wall was prepped with chlorhexidine in a sterile fashion, and a sterile drape was applied covering the operative field. A sterile gown and sterile gloves were used for the procedure. Local anesthesia was provided with 1% Lidocaine. Initial CT was performed in a supine position with the left side rolled up slightly. Under CT fluoroscopic guidance, a 17 gauge needle was advanced  into the peripheral aspect of  the lingular mass. A single 18 gauge core biopsy sample was obtained and sample placed in formalin. The 17 gauge needle was removed and the patient rolled on the CT table with the left side down. COMPLICATIONS: SIR level B: Nominal therapy (including overnight admission for observation), no consequence. FINDINGS: Rounded mass in the lateral aspect of the left upper lobe/lingula measures approximately 4 cm. After placement of the 17 gauge needle into the periphery of the mass, a single coaxial 18 gauge core biopsy sample was performed. There was immediate coughing and gross hemoptysis after the single core biopsy sample. The patient was immediately placed in a decubitus position with the left side down. Hemoptysis lasted for approximately 15 minutes with intermittent mild gross hemoptysis resulting in a total blood loss of no more than 15 mL. The hemoptysis did eventually resolve. The patient never became hypoxic and immediate postprocedural CT shows no evidence of pneumothorax. A chest x-ray will be obtained immediately following the biopsy. The patient will be admitted for overnight observation due to the gross hemoptysis. IMPRESSION: CT-guided core biopsy performed of a a left upper lobe/lingular lung mass. A single intact core biopsy sample was able to be obtained. Additional samples could not be performed due to development of gross hemoptysis. This appear to be under control after initial observation. The patient will be admitted for overnight observation. Electronically Signed   By: Aletta Edouard M.D.   On: 10/21/2015 16:33    ASSESSMENT AND PLAN: This is a very pleasant 75 year old white female with:  1) history of multiple myeloma as well as chronic kidney disease status post several chemotherapy regimen and most recently treated with Carfilzomib, Cytoxan and Decadron status post 5 cycles. The patient has been tolerating her treatment well except for the fatigue secondary  to end-stage renal disease as well as anemia of chronic disease. The patient is currently on observation and feeling fine. Unfortunately the recent myeloma panel showed evidence for disease progression with increase in the free kappa light chain and worsening of her renal function as well as anemia. I discussed the lab result with the patient and her daughter. I recommended for her to resume her systemic chemotherapy with Carfilzomib, Cytoxan and Decadron. She started the first dose of the treatment on 07/11/2015. She tolerated the first 3 cycles well with no specific complaints except for fatigue and pancytopenia. She developed itching during the Cytoxan infusion earlier in cycle 4. Premedication with Benadryl and dexamethasone helps. She is here today for continuation of cycle #4 of her treatment. Platelet count is 46,000 today. She has no active bleeding. Counts reviewed with Dr. Julien Nordmann who recommends that we hold her chemotherapy this week and let her counts recover. She is scheduled back on November 7 and will keep this appointment. We will recheck her labs on that date.  2) new left upper lobe lung mass: Biopsy consistent with a cryptococcal infection. She was placed on fluconazole which she will start later today. She has a pending appointment with infectious disease.  3) For the chronic kidney disease, the patient will continue her routine follow-up visit with her nephrologist.  The patient was advised to call immediately if she has any concerning symptoms in the interval. The patient voices understanding of current disease status and treatment options and is in agreement with the current care plan.  All questions were answered. The patient knows to call the clinic with any problems, questions or concerns. We can certainly see the patient much sooner if  necessary.  Mikey Bussing, DNP, AGPCNP-BC, AOCNP

## 2015-11-09 ENCOUNTER — Other Ambulatory Visit: Payer: Medicare Other

## 2015-11-09 ENCOUNTER — Ambulatory Visit: Payer: Medicare Other

## 2015-11-21 ENCOUNTER — Other Ambulatory Visit: Payer: Self-pay | Admitting: *Deleted

## 2015-11-21 DIAGNOSIS — C9000 Multiple myeloma not having achieved remission: Secondary | ICD-10-CM

## 2015-11-22 ENCOUNTER — Ambulatory Visit (HOSPITAL_BASED_OUTPATIENT_CLINIC_OR_DEPARTMENT_OTHER): Payer: Medicare Other | Admitting: Internal Medicine

## 2015-11-22 ENCOUNTER — Ambulatory Visit: Payer: Medicare Other

## 2015-11-22 ENCOUNTER — Other Ambulatory Visit (HOSPITAL_BASED_OUTPATIENT_CLINIC_OR_DEPARTMENT_OTHER): Payer: Medicare Other

## 2015-11-22 ENCOUNTER — Encounter: Payer: Self-pay | Admitting: Internal Medicine

## 2015-11-22 VITALS — BP 112/50 | HR 68 | Temp 98.4°F | Resp 18 | Ht 65.0 in | Wt 154.7 lb

## 2015-11-22 DIAGNOSIS — C9000 Multiple myeloma not having achieved remission: Secondary | ICD-10-CM

## 2015-11-22 DIAGNOSIS — B450 Pulmonary cryptococcosis: Secondary | ICD-10-CM | POA: Diagnosis not present

## 2015-11-22 DIAGNOSIS — N189 Chronic kidney disease, unspecified: Secondary | ICD-10-CM | POA: Diagnosis not present

## 2015-11-22 DIAGNOSIS — D631 Anemia in chronic kidney disease: Secondary | ICD-10-CM | POA: Diagnosis not present

## 2015-11-22 HISTORY — DX: Pulmonary cryptococcosis: B45.0

## 2015-11-22 LAB — CBC WITH DIFFERENTIAL/PLATELET
BASO%: 0.2 % (ref 0.0–2.0)
BASOS ABS: 0 10*3/uL (ref 0.0–0.1)
EOS%: 3.4 % (ref 0.0–7.0)
Eosinophils Absolute: 0.2 10*3/uL (ref 0.0–0.5)
HEMATOCRIT: 28.6 % — AB (ref 34.8–46.6)
HGB: 9.4 g/dL — ABNORMAL LOW (ref 11.6–15.9)
LYMPH#: 1.3 10*3/uL (ref 0.9–3.3)
LYMPH%: 25.9 % (ref 14.0–49.7)
MCH: 36.2 pg — AB (ref 25.1–34.0)
MCHC: 32.9 g/dL (ref 31.5–36.0)
MCV: 110 fL — ABNORMAL HIGH (ref 79.5–101.0)
MONO#: 0.8 10*3/uL (ref 0.1–0.9)
MONO%: 16 % — ABNORMAL HIGH (ref 0.0–14.0)
NEUT#: 2.7 10*3/uL (ref 1.5–6.5)
NEUT%: 54.5 % (ref 38.4–76.8)
PLATELETS: 80 10*3/uL — AB (ref 145–400)
RBC: 2.6 10*6/uL — ABNORMAL LOW (ref 3.70–5.45)
RDW: 18 % — ABNORMAL HIGH (ref 11.2–14.5)
WBC: 5 10*3/uL (ref 3.9–10.3)

## 2015-11-22 LAB — COMPREHENSIVE METABOLIC PANEL
ALT: 13 U/L (ref 0–55)
ANION GAP: 8 meq/L (ref 3–11)
AST: 16 U/L (ref 5–34)
Albumin: 3.4 g/dL — ABNORMAL LOW (ref 3.5–5.0)
Alkaline Phosphatase: 109 U/L (ref 40–150)
BUN: 30.3 mg/dL — ABNORMAL HIGH (ref 7.0–26.0)
CALCIUM: 8.8 mg/dL (ref 8.4–10.4)
CHLORIDE: 113 meq/L — AB (ref 98–109)
CO2: 20 mEq/L — ABNORMAL LOW (ref 22–29)
Creatinine: 2.5 mg/dL — ABNORMAL HIGH (ref 0.6–1.1)
EGFR: 18 mL/min/{1.73_m2} — ABNORMAL LOW (ref >90)
Glucose: 104 mg/dl (ref 70–140)
POTASSIUM: 4.1 meq/L (ref 3.5–5.1)
Sodium: 141 mEq/L (ref 136–145)
Total Bilirubin: 0.62 mg/dL (ref 0.20–1.20)
Total Protein: 5.8 g/dL — ABNORMAL LOW (ref 6.4–8.3)

## 2015-11-22 NOTE — Progress Notes (Signed)
[place] [phone]   Fax:([phone]) [place], [place] [address]  PRINCIPAL DIAGNOSES:  1. Large left upper lobe lung mass, consistent with cryptococcal infection. 2. Recurrent multiple myeloma initially diagnosed in 2002, initially with smoldering myeloma at [facility].  3. Ductal carcinoma in situ status post mastectomy with sentinel lymph node biopsy in October 2008. 4.   PRIOR THERAPY:  1. Status post treatment with tamoxifen from November 2008 through February 2009, discontinued secondary to intolerance. 2. Status post 3 cycles of chemotherapy with Revlimid and Decadron followed by 1 cycle of Decadron only with mild response. 3. Status post 2 cycles of systemic chemotherapy with Velcade, Doxil and Decadron discontinued secondary to significant peripheral neuropathy. Last dose was given May 2010 at [facility]. 4. Status post autologous peripheral blood stem cell transplant on October 01, 2008 at [facility] under the care of Dr. Phyllis Ginger.  5. Systemic chemotherapy with Carfilzomib initially was 20 mg/M2 and will be increased after cycle #1 to 36 mg/M2 on days 1, 2, 8, 9, 15 and 16 every 4 weeks in addition to Cytoxan 300 mg/M2 and Decadron 40 mg by mouth weekly basis, status post 4 cycles. First cycle on 12/29/2012. 6. She resumed chemotherapy with Carfilzomib, Cytoxan, and Decadron on 05/03/2014. Status post 6 cycles. 7. systemic chemotherapy again with Carfilzomib, Cytoxan and Decadron on 07/11/2015. Status post 3 cycles.   CURRENT THERAPY: Diflucan 200 mg by mouth daily  INTERVAL HISTORY: Nancy Norris 75 y.o. female returns to the clinic today for follow-up visit. The patient is feeling fine today was no specific complaints except for mild fatigue. She has been off treatment for the last few weeks. CT-guided core biopsy of the left  upper lobe lung mass was consistent with cryptococcal infection and no evidence of malignancy. She is scheduled to see infectious disease later this month at she was started by Dr. Annamaria Boots on Diflucan 200 mg by mouth daily. She is tolerating her treatment well. She denied having any current chest pain, shortness breath, cough or hemoptysis. She denied having any fever or chills. She has no significant weight loss or night sweats. She is here today for evaluation and discussion of her treatment options.   MEDICAL HISTORY: Past Medical History:  Diagnosis Date  . Anemia   . Anginal pain ([place])    used NTG x 2 May 31 and 06/15/13   . Anxiety   . Arthritis   . B12 deficiency 12/04/2014  . Breast cancer ([place])   . CKD (chronic kidney disease) stage 3, GFR 30-59 ml/min   . Complication of anesthesia   . COPD (chronic obstructive pulmonary disease) ([place])   . Depression   . Dizziness   . Dyspnea   . Fibromyalgia   . Fibromyalgia   . GERD (gastroesophageal reflux disease)   . Headache(784.0)   . Heart murmur   . Hemoptysis 10/21/2015  . History of blood transfusion    last one May 12   . Hx of cardiovascular stress test    LexiScan with low level exercise Myoview (02/2013): No ischemia, EF 72%; normal study  . Hx of echocardiogram    a.  Echocardiogram (12/26/2012): EF 89-21%, grade 1 diastolic dysfunction;   b.  Echocardiogram (02/2013): EF 55-60%, no WMA, trivial effusion  . Hyperkalemia   . Hyponatremia   . Hypothyroidism   . Mucositis   .  Multiple myeloma   . Myocardial infarction    in past, patient was unaware.   . Neuropathy ([place])   . Pain in joint, pelvic region and thigh 07/07/2015  . PONV (postoperative nausea and vomiting) 2008   after mastestomy    ALLERGIES:  is allergic to codeine; latex; other; onion; zyprexa [olanzapine]; adhesive [tape]; hydrocodone; iodinated diagnostic agents; and sulfa antibiotics.  MEDICATIONS:  Current Outpatient Prescriptions  Medication Sig  Dispense Refill  . albuterol (PROVENTIL HFA;VENTOLIN HFA) 108 (90 Base) MCG/ACT inhaler Inhale 1-2 puffs into the lungs every 6 (six) hours as needed for wheezing or shortness of breath (cough). 1 Inhaler 3  . azelastine (ASTELIN) 0.1 % nasal spray Place 2 sprays into both nostrils 2 (two) times daily. Use in each nostril as directed 30 mL 2  . buPROPion (WELLBUTRIN XL) 300 MG 24 hr tablet Take 1 tablet (300 mg total) by mouth daily. 90 tablet 1  . butalbital-acetaminophen-caffeine (FIORICET, ESGIC) 50-325-40 MG tablet TAKE 1 OR 2 TABLETS BY MOUTH EVERY 4 HOURS AS NEEDED FOR PAIN OR HEADACHE. 50 tablet 3  . cetirizine (ZYRTEC) 10 MG tablet Take 10 mg by mouth daily.     . Cholecalciferol 4000 UNITS TABS Take 4,000 Units by mouth daily.    . citalopram (CELEXA) 40 MG tablet Take 1 tablet daily or as directed 90 tablet 1  . cromolyn (OPTICROM) 4 % ophthalmic solution Place 1 drop into both eyes 4 (four) times daily.    . ferrous sulfate 325 (65 FE) MG tablet Take 1 tablet daily or as directed 90 tablet 1  . fluconazole (DIFLUCAN) 200 MG tablet Take 1 tablet (200 mg total) by mouth daily. 30 tablet 1  . fluticasone (FLONASE) 50 MCG/ACT nasal spray Place 2 sprays into both nostrils daily. 16 g 0  . fluticasone furoate-vilanterol (BREO ELLIPTA) 100-25 MCG/INH AEPB Inhale 1 puff into the lungs daily. 28 each 0  . gabapentin (NEURONTIN) 300 MG capsule TAKE 1 CAPSULE BY MOUTH AT BEDTIME. 90 capsule 1  . isosorbide mononitrate (IMDUR) 30 MG 24 hr tablet Take 1 tablet (30 mg total) by mouth daily. 90 tablet 3  . levothyroxine (SYNTHROID, LEVOTHROID) 150 MCG tablet Take 1 tablet (150 mcg total) by mouth daily. (Patient taking differently: Take 150 mcg by mouth daily. ) 90 tablet 1  . loratadine (CLARITIN) 10 MG tablet Take 10 mg by mouth daily.    Marland Kitchen LORazepam (ATIVAN) 0.5 MG tablet Take 1 tablet (0.5 mg total) by mouth every 8 (eight) hours as needed (nausea). 30 tablet 0  . loteprednol (ALREX) 0.2 % SUSP  Place 1 drop into both eyes 4 (four) times daily. 1 Bottle PRN  . magic mouthwash SOLN Take 5 mLs by mouth 4 (four) times daily as needed for mouth pain. 240 mL 1  . meloxicam (MOBIC) 15 MG tablet Take 1 tablet (15 mg total) by mouth daily. 90 tablet 3  . midodrine (PROAMATINE) 10 MG tablet TAKE 1 TABLET BY MOUTH 3 TIMES DAILY. 270 tablet 3  . montelukast (SINGULAIR) 10 MG tablet Take 1 tablet (10 mg total) by mouth daily. 30 tablet 2  . nystatin (MYCOSTATIN/NYSTOP) powder Apply topically 2 (two) times daily. 30 g 0  . pantoprazole (PROTONIX) 40 MG tablet TAKE 1 TABLET DAILY FOR ACID REFLUX & INDIGESTION 90 tablet 1  . pilocarpine (SALAGEN) 5 MG tablet Take 1 tablet 3 x / day for dry mouth. 270 tablet 1  . PRESCRIPTION MEDICATION Pt gets chemo at [facility].    Marland Kitchen  ranitidine (ZANTAC) 300 MG tablet Take 1 to 2 tablets daily for heartburn & reflux to allow wean and transition from PPI / pantoprazole (Patient taking differently: Take 300 mg by mouth at bedtime. Take 1 to 2 tablets daily for heartburn & reflux to allow wean and transition from PPI / pantoprazole) 180 tablet 3  . senna (SENOKOT) 8.6 MG tablet Take 1 tablet by mouth at bedtime.    . vitamin C (ASCORBIC ACID) 250 MG tablet Take 250 mg by mouth daily.    Marland Kitchen acetaminophen (TYLENOL) 325 MG tablet Take 650 mg by mouth every 6 (six) hours as needed for mild pain or moderate pain.     . nitroGLYCERIN (NITROSTAT) 0.4 MG SL tablet Place 0.4 mg under the tongue every 5 (five) minutes as needed for chest pain. Reported on 03/30/2015    . promethazine (PHENERGAN) 25 MG tablet Take 25 mg by mouth.    . traMADol (ULTRAM) 50 MG tablet Take 1 to 2 tablets by mouth 4 times a day as needed for pain. (Patient not taking: Reported on 11/22/2015) 180 tablet 0   No current facility-administered medications for this visit.    Facility-Administered Medications Ordered in Other Visits  Medication Dose Route Frequency Provider Last Rate Last Dose  . 0.9 %  sodium  chloride infusion   Intravenous Once Adrena E Johnson, PA-C      . acetaminophen (TYLENOL) tablet 650 mg  650 mg Oral Once Susanne Borders, NP      . sodium chloride 0.9 % injection 10 mL  10 mL Intracatheter PRN Curt Bears, MD   10 mL at 01/05/13 1825  . sodium chloride 0.9 % injection 10 mL  10 mL Intracatheter PRN Curt Bears, MD   10 mL at 06/15/14 1514    SURGICAL HISTORY:  Past Surgical History:  Procedure Laterality Date  . ABDOMINAL HYSTERECTOMY  1981  . AV FISTULA PLACEMENT Left 06/19/2013   Procedure: CREATION OF LEFT ARM ARTERIOVENOUS (AV) FISTULA ;  Surgeon: Angelia Mould, MD;  Location: [facility];  Service: Vascular;  Laterality: Left;  . BREAST RECONSTRUCTION    . BREAST SURGERY    . CATARACT EXTRACTION, BILATERAL    . CHOLECYSTECTOMY  1971  . EYE SURGERY Bilateral    lens implant  . history of Port removal    . LUNG BIOPSY  10/21/2015  . MASTECTOMY Left 2008  . PORTACATH PLACEMENT  12/2012   has had 2  . Status post stem cell transplant on September 28, 2008.      REVIEW OF SYSTEMS:  Constitutional: positive for fatigue Eyes: negative Ears, nose, mouth, throat, and face: negative Respiratory: negative Cardiovascular: negative Gastrointestinal: negative Genitourinary:negative Integument/breast: negative Hematologic/lymphatic: negative Musculoskeletal:negative Neurological: negative Behavioral/Psych: negative Endocrine: negative Allergic/Immunologic: negative   PHYSICAL EXAMINATION: General appearance: alert, cooperative and no distress Head: Normocephalic, without obvious abnormality, atraumatic Neck: no adenopathy, no JVD, supple, symmetrical, trachea midline and thyroid not enlarged, symmetric, no tenderness/mass/nodules Lymph nodes: Cervical, supraclavicular, and axillary nodes normal. Resp: clear to auscultation bilaterally Back: symmetric, no curvature. ROM normal. No CVA tenderness. Cardio: regular rate and rhythm, S1, S2 normal, no  murmur, click, rub or gallop GI: soft, non-tender; bowel sounds normal; no masses,  no organomegaly Extremities: extremities normal, atraumatic, no cyanosis or edema Neurologic: Alert and oriented X 3, normal strength and tone. Normal symmetric reflexes. Normal coordination and gait  ECOG PERFORMANCE STATUS: 1 - Symptomatic but completely ambulatory  Blood pressure (!) 112/50, pulse 68, temperature  98.4 F (36.9 C), temperature source Oral, resp. rate 18, height '5\' 5"'$  (1.651 m), weight 154 lb 11.2 oz (70.2 kg), SpO2 98 %.  LABORATORY DATA: Lab Results  Component Value Date   WBC 5.0 11/22/2015   HGB 9.4 (L) 11/22/2015   HCT 28.6 (L) 11/22/2015   MCV 110.0 (H) 11/22/2015   PLT 80 (L) 11/22/2015      Chemistry      Component Value Date/Time   NA 141 11/22/2015 0829   K 4.1 11/22/2015 0829   CL 106 11/08/2015 1013   CL 105 03/19/2012 0811   CO2 20 (L) 11/22/2015 0829   BUN 30.3 (H) 11/22/2015 0829   CREATININE 2.5 (H) 11/22/2015 0829      Component Value Date/Time   CALCIUM 8.8 11/22/2015 0829   ALKPHOS 109 11/22/2015 0829   AST 16 11/22/2015 0829   ALT 13 11/22/2015 0829   BILITOT 0.62 11/22/2015 0829     RADIOGRAPHIC STUDIES: Dg Chest 2 View  Result Date: 11/04/2015 CLINICAL DATA:  Shortness of breath. EXAM: CHEST  2 VIEW COMPARISON:  10/22/2015.  CT chest 10/21/2015.  PET-CT 10/11/2015. FINDINGS: PowerPort catheter noted with tip projected over cavoatrial junction. Stable cardiomegaly. Mass lesion noted in the left mid lung is is again noted. This mass lesion may be slightly smaller on today's exam. No prominent pleural effusion or pneumothorax. Degenerative changes thoracic spine . IMPRESSION: 1. Power port catheter in stable position. 2. Mass lesion again noted in the left mid lung. The mass lesion may be slightly smaller on today's exam . Electronically Signed   By: [place]   On: 11/04/2015 13:31   Nm Pulmonary Per & Vent  Result Date: 11/04/2015 CLINICAL  DATA:  Eov.SOB, CHEST TIGHTNESS X 4-5 MTHS NO HX PE OR DVT EXAM: NUCLEAR MEDICINE VENTILATION - PERFUSION LUNG SCAN TECHNIQUE: Ventilation images were obtained in multiple projections using inhaled aerosol Tc-84mDTPA. Perfusion images were obtained in multiple projections after intravenous injection of Tc-920mAA. RADIOPHARMACEUTICALS:  33.0 MCI TC-78M DTPA AEROSOL INHALED/JLL/GHT 4.3 MCI TC-78M MAA RAC IV/JLL COMPARISON:  Radiograph from earlier the same day and previous studies FINDINGS: Ventilation: No focal ventilation defect. Mild paucity of activity in the upper lobes bilaterally. Perfusion: Heterogeneous distribution of radiopharmaceutical. No wedge shaped peripheral perfusion defects to suggest acute pulmonary embolism. IMPRESSION: 1. Low likelihood ratio for pulmonary embolism. Electronically Signed   By: D Lucrezia Europe.D.   On: 11/04/2015 14:54    ASSESSMENT AND PLAN: This is a very pleasant [age]ears old white female with:  1) history of multiple myeloma as well as chronic kidney disease status post several chemotherapy regimen and most recently treated with Carfilzomib, Cytoxan and Decadron status post 5 cycles. The patient has been tolerating her treatment well except for the fatigue secondary to end-stage renal disease as well as anemia of chronic disease. The patient is currently on observation and feeling fine. Unfortunately the recent myeloma panel showed evidence for disease progression with increase in the free kappa light chain and worsening of her renal function as well as anemia. I discussed the lab result with the patient and her daughter. I recommended for her to resume her systemic chemotherapy with Carfilzomib, Cytoxan and Decadron. She started the first dose of the treatment on 07/11/2015. She tolerated the first 4 cycles well with no specific complaints except for fatigue and pancytopenia. I recommended for the patient to hold any further treatment for her multiple myeloma for now  until treatment of the  cryptococcal infection of her lung. I will repeat myeloma panel in 2 months.  2) cryptococcal infection of the lung: She is currently on Diflucan and expected to see infectious disease physicians later this month for further recommendation regarding her condition. I will repeat CT scan of the chest in late December 2017 for reevaluation of her condition.  3) For the chronic kidney disease, the patient will continue her routine follow-up visit with her nephrologist.  The patient was advised to call immediately if she has any concerning symptoms in the interval. The patient voices understanding of current disease status and treatment options and is in agreement with the current care plan.  All questions were answered. The patient knows to call the clinic with any problems, questions or concerns. We can certainly see the patient much sooner if necessary.  Disclaimer: This note was dictated with voice recognition software. Similar sounding words can inadvertently be transcribed and may not be corrected upon review.

## 2015-11-22 NOTE — Progress Notes (Signed)
Per Dr. Julien Nordmann, patient will not be treated today.  Patient aware of plan of care.

## 2015-11-23 ENCOUNTER — Ambulatory Visit: Payer: Medicare Other

## 2015-11-27 ENCOUNTER — Encounter: Payer: Self-pay | Admitting: Internal Medicine

## 2015-11-29 ENCOUNTER — Other Ambulatory Visit: Payer: Medicare Other

## 2015-11-29 ENCOUNTER — Ambulatory Visit: Payer: Medicare Other

## 2015-11-29 ENCOUNTER — Other Ambulatory Visit: Payer: Self-pay | Admitting: Internal Medicine

## 2015-11-30 ENCOUNTER — Ambulatory Visit: Payer: Medicare Other

## 2015-12-02 ENCOUNTER — Telehealth: Payer: Self-pay | Admitting: *Deleted

## 2015-12-02 NOTE — Telephone Encounter (Signed)
Called pt regarding concern with lab appt not being 1 week prior to MD appt. Pt advised she has to rely on other people to bring her to appts and to leave the appts as they are. No further concerns

## 2015-12-02 NOTE — Telephone Encounter (Signed)
"  I've scheduled CT Chest today.  The lab has been scheduled incorrectly.  Dr. Julien Nordmann orders my labs a week before the appointment to have the lab results  for the appointment."  Reviewed scheduling request from last f/u.  Dates for lab and F/U scheduled as requested by provider.  Will notify Dr. of patient's concern.  "I'll reschedule the scan"

## 2015-12-06 ENCOUNTER — Other Ambulatory Visit: Payer: Medicare Other

## 2015-12-06 ENCOUNTER — Ambulatory Visit: Payer: Medicare Other

## 2015-12-07 ENCOUNTER — Ambulatory Visit: Payer: Self-pay | Admitting: Internal Medicine

## 2015-12-07 ENCOUNTER — Ambulatory Visit: Payer: Medicare Other

## 2015-12-12 ENCOUNTER — Other Ambulatory Visit: Payer: Self-pay | Admitting: Internal Medicine

## 2015-12-12 ENCOUNTER — Encounter: Payer: Self-pay | Admitting: Internal Medicine

## 2015-12-12 ENCOUNTER — Ambulatory Visit (INDEPENDENT_AMBULATORY_CARE_PROVIDER_SITE_OTHER): Payer: Medicare Other | Admitting: Internal Medicine

## 2015-12-12 VITALS — BP 111/67 | HR 72 | Temp 98.6°F | Wt 148.0 lb

## 2015-12-12 DIAGNOSIS — B450 Pulmonary cryptococcosis: Secondary | ICD-10-CM

## 2015-12-12 NOTE — Progress Notes (Addendum)
[facility]      Reason for Consult: cryptococcal pneumonia    Referring Physician: Dr. Julien Nordmann    Patient ID: Nancy Norris, female    DOB: Jun 11, 1940, 75 y.o.   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  No species identification.  No culture sent.  She was started on appropriate therapy by Dr. Annamaria Boots with renally adjusted fluconazole at 200 mg daily and on her second month.  No issues with the medication.  She is concerned though that she isn't able to continue chemotherapy due to the infection.  Last chemotherapy was in October.  No fever, recently with a 6 lb weight loss she thinks is now stable.  No night sweats.  No SOB.   Previous visits including oncology visits independently reviewed, CT independently reviewed and mass-area noted. She tells me she is anxious to restart chemotherapy when possible.     Past Medical History:  Diagnosis Date  . Anemia   . Anginal pain ([place])    used NTG x 2 May 31 and 06/15/13   . Anxiety   . Arthritis   . B12 deficiency 12/04/2014  . Breast cancer ([place])   . CKD (chronic kidney disease) stage 3, GFR 30-59 ml/min   . Complication of anesthesia   . COPD (chronic obstructive pulmonary disease) ([place])   . Cryptococcal pneumonitis ([place]) 11/22/2015  . Depression   . Dizziness   . Dyspnea   . Fibromyalgia   . Fibromyalgia   . GERD (gastroesophageal reflux disease)   . Headache(784.0)   . Heart murmur   . Hemoptysis 10/21/2015  . History of blood transfusion    last one May 12   . Hx of cardiovascular stress test    LexiScan with low level exercise Myoview (02/2013): No ischemia, EF 72%; normal study  . Hx of echocardiogram    a.  Echocardiogram (12/26/2012): EF 89-16%, grade 1 diastolic dysfunction;   b.  Echocardiogram (02/2013): EF 55-60%, no WMA, trivial  effusion  . Hyperkalemia   . Hyponatremia   . Hypothyroidism   . Mucositis   . Multiple myeloma   . Myocardial infarction    in past, patient was unaware.   . Neuropathy ([place])   . Pain in joint, pelvic region and thigh 07/07/2015  . PONV (postoperative nausea and vomiting) 2008   after mastestomy    Prior to Admission medications   Medication Sig Start Date End Date Taking? Authorizing Provider  acetaminophen (TYLENOL) 325 MG tablet Take 650 mg by mouth every 6 (six) hours as needed for mild pain or moderate pain.     Historical Provider, MD  albuterol (PROVENTIL HFA;VENTOLIN HFA) 108 (90 Base) MCG/ACT inhaler Inhale 1-2 puffs into the lungs every 6 (six) hours as needed for wheezing or shortness of breath (cough). 08/30/15   Courtney Forcucci, PA-C  azelastine (ASTELIN) 0.1 % nasal spray Place 2 sprays into both nostrils 2 (two) times daily. Use in each nostril as directed 02/25/15 02/25/16  Courtney Forcucci, PA-C  buPROPion (WELLBUTRIN XL) 300 MG 24 hr tablet TAKE 1 TABLET BY MOUTH EACH MORNING FOR MOOD AND DEPRESSION. 11/29/15   [name], MD  butalbital-acetaminophen-caffeine (FIORICET, ESGIC) 50-325-40 MG tablet TAKE 1 OR 2 TABLETS BY MOUTH EVERY 4 HOURS AS NEEDED FOR PAIN OR HEADACHE. 11/03/15   [name], MD  cetirizine (ZYRTEC) 10 MG tablet Take 10 mg by mouth daily.  03/29/09   Historical Provider, MD  Cholecalciferol 4000 UNITS TABS Take 4,000 Units by mouth daily.    Historical Provider, MD  citalopram (CELEXA) 40 MG tablet Take 1 tablet daily or as directed 07/22/15 01/21/16  [name], MD  cromolyn (OPTICROM) 4 % ophthalmic solution Place 1 drop into both eyes 4 (four) times daily.    Historical Provider, MD  ferrous sulfate 325 (65 FE) MG tablet Take 1 tablet daily or as directed 07/22/15 01/21/16  [name], MD  fluconazole (DIFLUCAN) 200 MG tablet Take 1 tablet (200 mg total) by mouth daily. 11/07/15   Deneise Lever, MD  fluticasone (FLONASE) 50 MCG/ACT nasal  spray Place 2 sprays into both nostrils daily. 10/18/15   Courtney Forcucci, PA-C  fluticasone furoate-vilanterol (BREO ELLIPTA) 100-25 MCG/INH AEPB Inhale 1 puff into the lungs daily. 08/30/15   Courtney Forcucci, PA-C  gabapentin (NEURONTIN) 300 MG capsule TAKE 1 CAPSULE BY MOUTH AT BEDTIME. 07/22/15   [name], MD  isosorbide mononitrate (IMDUR) 30 MG 24 hr tablet Take 1 tablet (30 mg total) by mouth daily. 07/21/15   [name], MD  levothyroxine (SYNTHROID, LEVOTHROID) 150 MCG tablet Take 1 tablet (150 mcg total) by mouth daily. Patient taking differently: Take 150 mcg by mouth daily.  07/22/15   [name], MD  loratadine (CLARITIN) 10 MG tablet Take 10 mg by mouth daily.    Historical Provider, MD  LORazepam (ATIVAN) 0.5 MG tablet Take 1 tablet (0.5 mg total) by mouth every 8 (eight) hours as needed (nausea). 08/18/14   Curt Bears, MD  loteprednol (ALREX) 0.2 % SUSP Place 1 drop into both eyes 4 (four) times daily. 12/08/13   Vicie Mutters, PA-C  magic mouthwash SOLN Take 5 mLs by mouth 4 (four) times daily as needed for mouth pain. 10/27/15   Curt Bears, MD  meloxicam (MOBIC) 15 MG tablet Take 1 tablet (15 mg total) by mouth daily. 07/21/15   [name], MD  midodrine (PROAMATINE) 10 MG tablet TAKE 1 TABLET BY MOUTH 3 TIMES DAILY. 07/21/15   [name], MD  montelukast (SINGULAIR) 10 MG tablet Take 1 tablet (10 mg total) by mouth daily. 10/18/15 10/17/16  Courtney Forcucci, PA-C  nitroGLYCERIN (NITROSTAT) 0.4 MG SL tablet Place 0.4 mg under the tongue every 5 (five) minutes as needed for chest pain. Reported on 03/30/2015    Historical Provider, MD  nystatin (MYCOSTATIN/NYSTOP) powder Apply topically 2 (two) times daily. 11/08/15   Maryanna Shape, NP  pantoprazole (PROTONIX) 40 MG tablet TAKE 1 TABLET DAILY FOR ACID REFLUX & INDIGESTION 09/07/15   Vicie Mutters, PA-C  pilocarpine (SALAGEN) 5 MG tablet Take 1 tablet 3 x / day for dry mouth. 07/22/15 01/22/16  [name], MD  PRESCRIPTION MEDICATION Pt gets chemo at [facility].    Historical Provider, MD  promethazine (PHENERGAN) 25 MG tablet Take 25 mg by mouth. 10/14/09   Historical Provider, MD  ranitidine (ZANTAC) 300 MG tablet Take 1 to 2 tablets daily for heartburn & reflux to allow wean and transition from PPI / pantoprazole Patient taking differently: Take 300 mg by mouth at bedtime. Take 1 to 2 tablets daily for heartburn & reflux to allow wean and transition from PPI / pantoprazole 07/21/15   [name], MD  senna (SENOKOT) 8.6 MG tablet Take 1 tablet by mouth at bedtime.    Historical Provider, MD  traMADol (ULTRAM) 50 MG tablet Take 1  to 2 tablets by mouth 4 times a day as needed for pain. Patient not taking: Reported on 11/22/2015 07/22/15   [name], MD  vitamin C (ASCORBIC ACID) 250 MG tablet Take 250 mg by mouth daily.    Historical Provider, MD    Allergies  Allergen Reactions  . Codeine Anaphylaxis  . Latex Shortness Of Breath    Adhesive products   . Other Other (See Comments)    Onion, chocolate causes migraines  . Onion Other (See Comments)    Causes migraine headaches  . Zyprexa [Olanzapine] Other (See Comments)    Confusion , dizzy,unsteady  . Adhesive [Tape] Other (See Comments)    blisters  . Hydrocodone Rash    With extreme itching  . Iodinated Diagnostic Agents Itching, Rash and Hives    Happened 60 years ago  . Sulfa Antibiotics Itching    Social History  Substance Use Topics  . Smoking status: Former Smoker    Packs/day: 1.00    Years: 30.00    Types: Cigarettes    Quit date: 02/15/2005  . Smokeless tobacco: Never Used  . Alcohol use No    Family History  Problem Relation Age of Onset  . Arthritis Mother   . Asthma Mother   . Cancer Sister   . Hyperlipidemia Brother      Review of Systems  Constitutional: negative for fevers, chills, fatigue and malaise Gastrointestinal: negative for diarrhea Integument/breast: negative for rash and skin  lesion(s) Musculoskeletal: negative for myalgias and arthralgias All other systems reviewed and are negative    Constitutional: in no apparent distress and alert There were no vitals filed for this visit. EYES: anicteric ENMT: no thrush Cardiovascular: Cor RRR Respiratory: CTA B; normal respiratory effort GI: Bowel sounds are normal, liver is not enlarged, spleen is not enlarged Musculoskeletal: no pedal edema noted Skin: negatives: no rashes, no nodules Hematologic: no cervical lad  Labs: Lab Results  Component Value Date   WBC 5.0 11/22/2015   HGB 9.4 (L) 11/22/2015   HCT 28.6 (L) 11/22/2015   MCV 110.0 (H) 11/22/2015   PLT 80 (L) 11/22/2015    Lab Results  Component Value Date   CREATININE 2.5 (H) 11/22/2015   BUN 30.3 (H) 11/22/2015   NA 141 11/22/2015   K 4.1 11/22/2015   CL 106 11/08/2015   CO2 20 (L) 11/22/2015    Lab Results  Component Value Date   ALT 13 11/22/2015   AST 16 11/22/2015   ALKPHOS 109 11/22/2015   BILITOT 0.62 11/22/2015   INR 0.89 10/21/2015     Assessment: Crytpococcal pneumonia.  She has pathology confirmation of this, no culture done.  She is immunosuppressed with recent chemo which put her at risk.  I discussed findings, treatment with her including long term fluconazole.  I also discussed other potential sites including the CNS and need to rule this out due to different treatments, prognosis with ongoing chemotherapy.    Plan: 1) continue renally adjusted fluconazole for projected 6-12 months 2) LP for CSF studies of cell count, crytptococcal Ag, fungal culture, protein, glucose. 3) blood cryptococcal Ag 4) follow up in January after repeat CT, sooner if CSF studies concerning for infection  ADDENDUM: radiology has asked for a CT of the head prior to LP so will order now.

## 2015-12-12 NOTE — Addendum Note (Signed)
Addended by: Thayer Headings on: 12/12/2015 04:45 PM   Modules accepted: Orders

## 2015-12-12 NOTE — Addendum Note (Signed)
Addended by: Reggy Eye on: 12/12/2015 03:50 PM   Modules accepted: Orders

## 2015-12-14 ENCOUNTER — Telehealth: Payer: Self-pay | Admitting: *Deleted

## 2015-12-14 ENCOUNTER — Telehealth: Payer: Self-pay | Admitting: Internal Medicine

## 2015-12-14 LAB — CRYPTOCOCCAL AG, LTX SCR RFLX TITER: Cryptococcal Ag Screen: DETECTED — AB

## 2015-12-14 LAB — RFLX CRYPTOCOCCAL AG LATEX TITER

## 2015-12-14 NOTE — Telephone Encounter (Signed)
lvm to inform pt that lab appt on 12/26 has been r/s to 12/21 at 1030 am per LOS.per LOS labs results will take a week to return

## 2015-12-14 NOTE — Telephone Encounter (Addendum)
Critical HIGH cryptococcal screen = 1:256 reported.  Critical value verbally shared with Dr. Linus Salmons.  Results available for review in EPIC as well.

## 2015-12-20 ENCOUNTER — Ambulatory Visit: Payer: Medicare Other | Admitting: Internal Medicine

## 2015-12-20 ENCOUNTER — Ambulatory Visit: Payer: Medicare Other

## 2015-12-20 ENCOUNTER — Other Ambulatory Visit: Payer: Medicare Other

## 2015-12-20 ENCOUNTER — Telehealth: Payer: Self-pay | Admitting: *Deleted

## 2015-12-20 NOTE — Telephone Encounter (Signed)
Per Dr Linus Salmons scheduled patient for Head CT and LP and called to inform of appt. Had to leave a message for her and advised of information and to call the office if she has any further questions.

## 2015-12-21 ENCOUNTER — Ambulatory Visit: Payer: Medicare Other

## 2015-12-21 ENCOUNTER — Other Ambulatory Visit: Payer: Self-pay | Admitting: *Deleted

## 2015-12-21 DIAGNOSIS — B450 Pulmonary cryptococcosis: Secondary | ICD-10-CM

## 2015-12-22 ENCOUNTER — Ambulatory Visit (HOSPITAL_COMMUNITY)
Admission: RE | Admit: 2015-12-22 | Discharge: 2015-12-22 | Disposition: A | Discharge status: Home / Self Care | Payer: Medicare Other | Source: Ambulatory Visit | Attending: Internal Medicine | Admitting: Internal Medicine

## 2015-12-22 DIAGNOSIS — J320 Chronic maxillary sinusitis: Secondary | ICD-10-CM | POA: Diagnosis not present

## 2015-12-22 DIAGNOSIS — I739 Peripheral vascular disease, unspecified: Secondary | ICD-10-CM | POA: Insufficient documentation

## 2015-12-22 DIAGNOSIS — B450 Pulmonary cryptococcosis: Secondary | ICD-10-CM | POA: Insufficient documentation

## 2015-12-22 DIAGNOSIS — I679 Cerebrovascular disease, unspecified: Secondary | ICD-10-CM | POA: Diagnosis not present

## 2015-12-22 DIAGNOSIS — J0100 Acute maxillary sinusitis, unspecified: Secondary | ICD-10-CM | POA: Insufficient documentation

## 2015-12-26 ENCOUNTER — Other Ambulatory Visit: Payer: Self-pay | Admitting: Internal Medicine

## 2015-12-26 ENCOUNTER — Ambulatory Visit (HOSPITAL_COMMUNITY)
Admission: RE | Admit: 2015-12-26 | Discharge: 2015-12-26 | Disposition: A | Discharge status: Home / Self Care | Payer: Medicare Other | Source: Ambulatory Visit | Attending: Internal Medicine | Admitting: Internal Medicine

## 2015-12-26 ENCOUNTER — Ambulatory Visit (HOSPITAL_COMMUNITY): Payer: Medicare Other

## 2015-12-26 DIAGNOSIS — B450 Pulmonary cryptococcosis: Secondary | ICD-10-CM

## 2015-12-26 LAB — CSF CELL COUNT WITH DIFFERENTIAL
RBC Count, CSF: 77 /mm3 — ABNORMAL HIGH
TUBE #: 3
WBC, CSF: 1 /mm3 (ref 0–5)

## 2015-12-26 MED ORDER — LIDOCAINE HCL 1 % IJ SOLN
INTRAMUSCULAR | Status: AC
  Filled 2015-12-26: qty 10

## 2015-12-26 MED ORDER — LIDOCAINE HCL (PF) 1 % IJ SOLN: 10.0000 mL | Freq: Once | INTRAMUSCULAR | Status: DC

## 2015-12-26 NOTE — Progress Notes (Signed)
Per Dr Clovis Riley client may take home migraine pill; Client took home tylenol migraine

## 2015-12-26 NOTE — Discharge Instructions (Signed)

## 2015-12-26 NOTE — Telephone Encounter (Signed)
Noted  

## 2015-12-26 NOTE — Procedures (Signed)
Uneventful Lumbar Puncture.

## 2015-12-27 ENCOUNTER — Other Ambulatory Visit: Payer: Medicare Other

## 2015-12-27 ENCOUNTER — Telehealth: Payer: Self-pay | Admitting: *Deleted

## 2015-12-27 ENCOUNTER — Ambulatory Visit: Payer: Medicare Other

## 2015-12-27 DIAGNOSIS — H02052 Trichiasis without entropian right lower eyelid: Secondary | ICD-10-CM | POA: Diagnosis not present

## 2015-12-27 DIAGNOSIS — N184 Chronic kidney disease, stage 4 (severe): Secondary | ICD-10-CM | POA: Diagnosis not present

## 2015-12-27 DIAGNOSIS — N2581 Secondary hyperparathyroidism of renal origin: Secondary | ICD-10-CM | POA: Diagnosis not present

## 2015-12-27 DIAGNOSIS — N189 Chronic kidney disease, unspecified: Secondary | ICD-10-CM | POA: Diagnosis not present

## 2015-12-27 LAB — CRYPTOCOCCAL ANTIGEN, CSF: Crypto Ag: NEGATIVE

## 2015-12-27 NOTE — Telephone Encounter (Signed)
Left patient a voice mail to return the call for her results. Nancy Norris

## 2015-12-27 NOTE — Telephone Encounter (Signed)
Santiago Glad is contacting the hospital lab regarding adding orders. Landis Gandy, RN

## 2015-12-27 NOTE — Telephone Encounter (Signed)
-----   Message from Thayer Headings, MD sent at 12/26/2015  1:33 PM EST ----- Can you have the lab add on a CSF cryptococcal Ag on the CSF done today by radiology?  thanks

## 2015-12-27 NOTE — Telephone Encounter (Signed)
-----   Message from Thayer Headings, MD sent at 12/27/2015  9:58 AM EST ----- Please let her know that her LP looked fine, no concerns and can continue with the oral fluconazole for the pneumonia.  thanks

## 2015-12-28 ENCOUNTER — Ambulatory Visit: Payer: Medicare Other

## 2015-12-29 DIAGNOSIS — R911 Solitary pulmonary nodule: Secondary | ICD-10-CM | POA: Diagnosis not present

## 2015-12-29 DIAGNOSIS — I951 Orthostatic hypotension: Secondary | ICD-10-CM | POA: Diagnosis not present

## 2015-12-29 DIAGNOSIS — I770 Arteriovenous fistula, acquired: Secondary | ICD-10-CM | POA: Diagnosis not present

## 2015-12-29 DIAGNOSIS — I5030 Unspecified diastolic (congestive) heart failure: Secondary | ICD-10-CM | POA: Diagnosis not present

## 2015-12-29 DIAGNOSIS — R809 Proteinuria, unspecified: Secondary | ICD-10-CM | POA: Diagnosis not present

## 2015-12-29 DIAGNOSIS — B450 Pulmonary cryptococcosis: Secondary | ICD-10-CM | POA: Diagnosis not present

## 2015-12-29 DIAGNOSIS — R079 Chest pain, unspecified: Secondary | ICD-10-CM | POA: Diagnosis not present

## 2015-12-29 DIAGNOSIS — N2581 Secondary hyperparathyroidism of renal origin: Secondary | ICD-10-CM | POA: Diagnosis not present

## 2015-12-29 DIAGNOSIS — D630 Anemia in neoplastic disease: Secondary | ICD-10-CM | POA: Diagnosis not present

## 2015-12-29 DIAGNOSIS — N184 Chronic kidney disease, stage 4 (severe): Secondary | ICD-10-CM | POA: Diagnosis not present

## 2015-12-29 DIAGNOSIS — C9000 Multiple myeloma not having achieved remission: Secondary | ICD-10-CM | POA: Diagnosis not present

## 2015-12-29 LAB — CSF CULTURE: CULTURE: NO GROWTH

## 2015-12-29 LAB — CSF CULTURE W GRAM STAIN

## 2015-12-31 LAB — ANAEROBIC CULTURE

## 2016-01-03 ENCOUNTER — Ambulatory Visit: Payer: Medicare Other

## 2016-01-03 ENCOUNTER — Other Ambulatory Visit: Payer: Medicare Other

## 2016-01-04 ENCOUNTER — Ambulatory Visit: Payer: Medicare Other | Admitting: Internal Medicine

## 2016-01-04 ENCOUNTER — Ambulatory Visit: Payer: Medicare Other

## 2016-01-05 ENCOUNTER — Other Ambulatory Visit: Payer: Self-pay | Admitting: Internal Medicine

## 2016-01-05 ENCOUNTER — Other Ambulatory Visit (HOSPITAL_BASED_OUTPATIENT_CLINIC_OR_DEPARTMENT_OTHER): Payer: Medicare Other

## 2016-01-05 DIAGNOSIS — B450 Pulmonary cryptococcosis: Secondary | ICD-10-CM | POA: Diagnosis not present

## 2016-01-05 DIAGNOSIS — C9000 Multiple myeloma not having achieved remission: Secondary | ICD-10-CM

## 2016-01-05 LAB — CBC WITH DIFFERENTIAL/PLATELET
BASO%: 0.2 % (ref 0.0–2.0)
Basophils Absolute: 0 10*3/uL (ref 0.0–0.1)
EOS ABS: 0.2 10*3/uL (ref 0.0–0.5)
EOS%: 3.4 % (ref 0.0–7.0)
HCT: 32.3 % — ABNORMAL LOW (ref 34.8–46.6)
HEMOGLOBIN: 10.5 g/dL — AB (ref 11.6–15.9)
LYMPH#: 1.5 10*3/uL (ref 0.9–3.3)
LYMPH%: 24.6 % (ref 14.0–49.7)
MCH: 37 pg — ABNORMAL HIGH (ref 25.1–34.0)
MCHC: 32.5 g/dL (ref 31.5–36.0)
MCV: 113.7 fL — AB (ref 79.5–101.0)
MONO#: 0.7 10*3/uL (ref 0.1–0.9)
MONO%: 11.8 % (ref 0.0–14.0)
NEUT%: 60 % (ref 38.4–76.8)
NEUTROS ABS: 3.7 10*3/uL (ref 1.5–6.5)
PLATELETS: 104 10*3/uL — AB (ref 145–400)
RBC: 2.84 10*6/uL — ABNORMAL LOW (ref 3.70–5.45)
RDW: 13.1 % (ref 11.2–14.5)
WBC: 6.1 10*3/uL (ref 3.9–10.3)

## 2016-01-05 LAB — COMPREHENSIVE METABOLIC PANEL
ALBUMIN: 3.9 g/dL (ref 3.5–5.0)
ALK PHOS: 129 U/L (ref 40–150)
ALT: 21 U/L (ref 0–55)
ANION GAP: 10 meq/L (ref 3–11)
AST: 18 U/L (ref 5–34)
BILIRUBIN TOTAL: 0.37 mg/dL (ref 0.20–1.20)
BUN: 37.6 mg/dL — ABNORMAL HIGH (ref 7.0–26.0)
CO2: 24 mEq/L (ref 22–29)
CREATININE: 3.1 mg/dL — AB (ref 0.6–1.1)
Calcium: 9.9 mg/dL (ref 8.4–10.4)
Chloride: 106 mEq/L (ref 98–109)
EGFR: 14 mL/min/{1.73_m2} — AB (ref >90)
GLUCOSE: 103 mg/dL (ref 70–140)
Potassium: 4.3 mEq/L (ref 3.5–5.1)
Sodium: 140 mEq/L (ref 136–145)
TOTAL PROTEIN: 6.5 g/dL (ref 6.4–8.3)

## 2016-01-05 LAB — LACTATE DEHYDROGENASE: LDH: 170 U/L (ref 125–245)

## 2016-01-06 LAB — BETA 2 MICROGLOBULIN, SERUM: Beta-2: 8.6 mg/L — ABNORMAL HIGH (ref 0.6–2.4)

## 2016-01-06 LAB — IGG, IGA, IGM
IGM (IMMUNOGLOBIN M), SRM: 5 mg/dL — AB (ref 26–217)
IgA, Qn, Serum: 13 mg/dL — ABNORMAL LOW (ref 64–422)
IgG, Qn, Serum: 342 mg/dL — ABNORMAL LOW (ref 700–1600)

## 2016-01-06 LAB — KAPPA/LAMBDA LIGHT CHAINS
IG KAPPA FREE LIGHT CHAIN: 24.2 mg/L — AB (ref 3.3–19.4)
Ig Lambda Free Light Chain: 6.7 mg/L (ref 5.7–26.3)
KAPPA/LAMBDA FLC RATIO: 3.61 — AB (ref 0.26–1.65)

## 2016-01-10 ENCOUNTER — Other Ambulatory Visit: Payer: Medicare Other

## 2016-01-10 ENCOUNTER — Encounter (HOSPITAL_COMMUNITY): Payer: Self-pay

## 2016-01-10 ENCOUNTER — Ambulatory Visit (HOSPITAL_COMMUNITY)
Admission: RE | Admit: 2016-01-10 | Discharge: 2016-01-10 | Disposition: A | Discharge status: Home / Self Care | Payer: Medicare Other | Source: Ambulatory Visit | Attending: Internal Medicine | Admitting: Internal Medicine

## 2016-01-10 DIAGNOSIS — J439 Emphysema, unspecified: Secondary | ICD-10-CM | POA: Insufficient documentation

## 2016-01-10 DIAGNOSIS — C9000 Multiple myeloma not having achieved remission: Secondary | ICD-10-CM | POA: Insufficient documentation

## 2016-01-10 DIAGNOSIS — J168 Pneumonia due to other specified infectious organisms: Secondary | ICD-10-CM | POA: Diagnosis not present

## 2016-01-10 DIAGNOSIS — I700 Atherosclerosis of aorta: Secondary | ICD-10-CM | POA: Insufficient documentation

## 2016-01-10 DIAGNOSIS — B450 Pulmonary cryptococcosis: Secondary | ICD-10-CM | POA: Insufficient documentation

## 2016-01-11 ENCOUNTER — Other Ambulatory Visit: Payer: Self-pay | Admitting: *Deleted

## 2016-01-11 DIAGNOSIS — B450 Pulmonary cryptococcosis: Secondary | ICD-10-CM

## 2016-01-11 MED ORDER — FLUCONAZOLE 200 MG PO TABS: 200.0000 mg | ORAL_TABLET | Freq: Every day | ORAL | 1 refills | Status: DC

## 2016-01-12 ENCOUNTER — Telehealth: Payer: Self-pay | Admitting: Internal Medicine

## 2016-01-12 ENCOUNTER — Ambulatory Visit (HOSPITAL_BASED_OUTPATIENT_CLINIC_OR_DEPARTMENT_OTHER): Payer: Medicare Other | Admitting: Internal Medicine

## 2016-01-12 ENCOUNTER — Encounter: Payer: Self-pay | Admitting: Internal Medicine

## 2016-01-12 VITALS — BP 135/74 | HR 70 | Temp 98.0°F | Resp 18 | Wt 154.2 lb

## 2016-01-12 DIAGNOSIS — D631 Anemia in chronic kidney disease: Secondary | ICD-10-CM

## 2016-01-12 DIAGNOSIS — C9000 Multiple myeloma not having achieved remission: Secondary | ICD-10-CM

## 2016-01-12 DIAGNOSIS — B450 Pulmonary cryptococcosis: Secondary | ICD-10-CM

## 2016-01-12 DIAGNOSIS — N189 Chronic kidney disease, unspecified: Secondary | ICD-10-CM

## 2016-01-12 DIAGNOSIS — D696 Thrombocytopenia, unspecified: Secondary | ICD-10-CM

## 2016-01-12 NOTE — Progress Notes (Signed)
[place] [phone]   Fax:(336) [place] [address] [place] [address]  DIAGNOSIS:  1. 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 Recurrent multiple myeloma initially diagnosed as a smoldering myeloma at [facility] in 2002. 3. Ductal carcinoma in situ status post mastectomy with sentinel lymph node biopsy in October 2008.  PRIOR THERAPY: 1. Status post treatment with tamoxifen from November 2008 through February 2009, discontinued secondary to intolerance. 2. Status post 3 cycles of chemotherapy with Revlimid and Decadron followed by 1 cycle of Decadron only with mild response. 3. Status post 2 cycles of systemic chemotherapy with Velcade, Doxil and Decadron discontinued secondary to significant peripheral neuropathy. Last dose was given May 2010 at [facility]. 4. Status post autologous peripheral blood stem cell transplant on October 01, 2008 at [facility] under the care of Dr. Phyllis Ginger.  5. Systemic chemotherapy with Carfilzomib initially was 20 mg/M2 and will be increased after cycle #1 to 36 mg/M2 on days 1, 2, 8, 9, 15 and 16 every 4 weeks in addition to Cytoxan 300 mg/M2 and Decadron 40 mg by mouth weekly basis, status post 4 cycles. First cycle on 12/29/2012. 6. She resumed chemotherapy with Carfilzomib, Cytoxan, and Decadron on 05/03/2014. Status post 6 cycles. 7. systemic chemotherapy again with Carfilzomib, Cytoxan and Decadron on 07/11/2015. Status post 3 cycles.  CURRENT THERAPY: Diflucan 200 mg by mouth daily.  INTERVAL HISTORY: Nancy Norris 75 y.o. female returns to the clinic today for follow-up visit. The patient is feeling fine today was no specific complaints except for fatigue. She is currently undergoing treatment with Diflucan for the cryptococcal infection of the left upper lobe of the lung. She is followed  by Dr. Linus Salmons from infectious disease. She denied having any chest pain but continues to have shortness breath with exertion with mild cough and no hemoptysis. She denied having any fever or chills. She has no significant weight loss or night sweats. She denied having any nausea, vomiting, diarrhea or constipation. She had repeat myeloma panel performed recently and she is here for evaluation and discussion of her lab results.  MEDICAL HISTORY: Past Medical History:  Diagnosis Date  . Anemia   . Anginal pain ([place])    used NTG x 2 May 31 and 06/15/13   . Anxiety   . Arthritis   . B12 deficiency 12/04/2014  . Breast cancer ([place])   . CKD (chronic kidney disease) stage 3, GFR 30-59 ml/min   . Complication of anesthesia   . COPD (chronic obstructive pulmonary disease) ([place])   . Cryptococcal pneumonitis ([place]) 11/22/2015  . Depression   . Dizziness   . Dyspnea   . Fibromyalgia   . Fibromyalgia   . GERD (gastroesophageal reflux disease)   . Headache(784.0)   . Heart murmur   . Hemoptysis 10/21/2015  . History of blood transfusion    last one May 12   . Hx of cardiovascular stress test    LexiScan with low level exercise Myoview (02/2013): No ischemia, EF 72%; normal study  . Hx of echocardiogram    a.  Echocardiogram (12/26/2012): EF 83-25%, grade 1 diastolic dysfunction;   b.  Echocardiogram (02/2013): EF 55-60%, no WMA, trivial effusion  . Hyperkalemia   . Hyponatremia   . Hypothyroidism   . Mucositis   . Multiple myeloma   . Myocardial infarction  in past, patient was unaware.   . Neuropathy ([place])   . Pain in joint, pelvic region and thigh 07/07/2015  . PONV (postoperative nausea and vomiting) 2008   after mastestomy    ALLERGIES:  is allergic to codeine; latex; other; onion; zyprexa [olanzapine]; adhesive [tape]; hydrocodone; iodinated diagnostic agents; and sulfa antibiotics.  MEDICATIONS:  Current Outpatient Prescriptions  Medication Sig Dispense Refill  . acetaminophen  (TYLENOL) 325 MG tablet Take 650 mg by mouth every 6 (six) hours as needed for mild pain or moderate pain.     Marland Kitchen albuterol (PROVENTIL HFA;VENTOLIN HFA) 108 (90 Base) MCG/ACT inhaler Inhale 1-2 puffs into the lungs every 6 (six) hours as needed for wheezing or shortness of breath (cough). 1 Inhaler 3  . azelastine (ASTELIN) 0.1 % nasal spray Place 2 sprays into both nostrils 2 (two) times daily. Use in each nostril as directed 30 mL 2  . buPROPion (WELLBUTRIN XL) 300 MG 24 hr tablet TAKE 1 TABLET BY MOUTH EACH MORNING FOR MOOD AND DEPRESSION. 90 tablet 1  . butalbital-acetaminophen-caffeine (FIORICET, ESGIC) 50-325-40 MG tablet TAKE 1 OR 2 TABLETS BY MOUTH EVERY 4 HOURS AS NEEDED FOR PAIN OR HEADACHE. 50 tablet 3  . cetirizine (ZYRTEC) 10 MG tablet Take 10 mg by mouth daily.     . Cholecalciferol 4000 UNITS TABS Take 4,000 Units by mouth daily.    . citalopram (CELEXA) 40 MG tablet Take 1 tablet daily or as directed 90 tablet 1  . cromolyn (OPTICROM) 4 % ophthalmic solution Place 1 drop into both eyes 4 (four) times daily.    . ferrous sulfate 325 (65 FE) MG tablet Take 1 tablet daily or as directed 90 tablet 1  . fluconazole (DIFLUCAN) 200 MG tablet Take 1 tablet (200 mg total) by mouth daily. 30 tablet 1  . fluticasone (FLONASE) 50 MCG/ACT nasal spray Place 2 sprays into both nostrils daily. 16 g 0  . fluticasone furoate-vilanterol (BREO ELLIPTA) 100-25 MCG/INH AEPB Inhale 1 puff into the lungs daily. 28 each 0  . gabapentin (NEURONTIN) 300 MG capsule TAKE 1 CAPSULE BY MOUTH AT BEDTIME. 90 capsule 1  . isosorbide mononitrate (IMDUR) 30 MG 24 hr tablet Take 1 tablet (30 mg total) by mouth daily. 90 tablet 3  . levothyroxine (SYNTHROID, LEVOTHROID) 150 MCG tablet Take 1 tablet (150 mcg total) by mouth daily. (Patient taking differently: Take 150 mcg by mouth daily. ) 90 tablet 1  . levothyroxine (SYNTHROID, LEVOTHROID) 150 MCG tablet TAKE 1 TABLET BY MOUTH ONCE DAILY. 90 tablet 0  . loratadine  (CLARITIN) 10 MG tablet Take 10 mg by mouth daily.    Marland Kitchen LORazepam (ATIVAN) 0.5 MG tablet Take 1 tablet (0.5 mg total) by mouth every 8 (eight) hours as needed (nausea). 30 tablet 0  . loteprednol (ALREX) 0.2 % SUSP Place 1 drop into both eyes 4 (four) times daily. 1 Bottle PRN  . magic mouthwash SOLN Take 5 mLs by mouth 4 (four) times daily as needed for mouth pain. 240 mL 1  . meloxicam (MOBIC) 15 MG tablet Take 1 tablet (15 mg total) by mouth daily. 90 tablet 3  . midodrine (PROAMATINE) 10 MG tablet TAKE 1 TABLET BY MOUTH 3 TIMES DAILY. 270 tablet 3  . montelukast (SINGULAIR) 10 MG tablet Take 1 tablet (10 mg total) by mouth daily. 30 tablet 2  . nitroGLYCERIN (NITROSTAT) 0.4 MG SL tablet Place 0.4 mg under the tongue every 5 (five) minutes as needed for chest pain. Reported on 03/30/2015    .  nystatin (MYCOSTATIN/NYSTOP) powder Apply topically 2 (two) times daily. 30 g 0  . pantoprazole (PROTONIX) 40 MG tablet TAKE 1 TABLET DAILY FOR ACID REFLUX & INDIGESTION 90 tablet 1  . pilocarpine (SALAGEN) 5 MG tablet Take 1 tablet 3 x / day for dry mouth. 270 tablet 1  . PRESCRIPTION MEDICATION Pt gets chemo at [facility].    . promethazine (PHENERGAN) 25 MG tablet Take 25 mg by mouth.    . ranitidine (ZANTAC) 300 MG tablet Take 1 to 2 tablets daily for heartburn & reflux to allow wean and transition from PPI / pantoprazole (Patient taking differently: Take 300 mg by mouth at bedtime. Take 1 to 2 tablets daily for heartburn & reflux to allow wean and transition from PPI / pantoprazole) 180 tablet 3  . senna (SENOKOT) 8.6 MG tablet Take 1 tablet by mouth at bedtime.    . traMADol (ULTRAM) 50 MG tablet Take 1 to 2 tablets by mouth 4 times a day as needed for pain. 180 tablet 0  . vitamin C (ASCORBIC ACID) 250 MG tablet Take 250 mg by mouth daily.     No current facility-administered medications for this visit.    Facility-Administered Medications Ordered in Other Visits  Medication Dose Route Frequency Provider  Last Rate Last Dose  . 0.9 %  sodium chloride infusion   Intravenous Once Adrena E Johnson, PA-C      . acetaminophen (TYLENOL) tablet 650 mg  650 mg Oral Once Cynthia R Bacon, NP      . sodium chloride 0.9 % injection 10 mL  10 mL Intracatheter PRN  , MD   10 mL at 01/05/13 1825  . sodium chloride 0.9 % injection 10 mL  10 mL Intracatheter PRN  , MD   10 mL at 06/15/14 1514    SURGICAL HISTORY:  Past Surgical History:  Procedure Laterality Date  . ABDOMINAL HYSTERECTOMY  1981  . AV FISTULA PLACEMENT Left 06/19/2013   Procedure: CREATION OF LEFT ARM ARTERIOVENOUS (AV) FISTULA ;  Surgeon: Christopher S Dickson, MD;  Location: [facility] OR;  Service: Vascular;  Laterality: Left;  . BREAST RECONSTRUCTION    . BREAST SURGERY    . CATARACT EXTRACTION, BILATERAL    . CHOLECYSTECTOMY  1971  . EYE SURGERY Bilateral    lens implant  . history of Port removal    . LUNG BIOPSY  10/21/2015  . MASTECTOMY Left 2008  . PORTACATH PLACEMENT  12/2012   has had 2  . Status post stem cell transplant on September 28, 2008.      REVIEW OF SYSTEMS:  Constitutional: positive for fatigue Eyes: negative Ears, nose, mouth, throat, and face: negative Respiratory: positive for cough and dyspnea on exertion Cardiovascular: negative Gastrointestinal: negative Genitourinary:negative Integument/breast: negative Hematologic/lymphatic: negative Musculoskeletal:negative Neurological: negative Behavioral/Psych: negative Endocrine: negative Allergic/Immunologic: negative   PHYSICAL EXAMINATION: General appearance: alert, cooperative, fatigued and no distress Head: Normocephalic, without obvious abnormality, atraumatic Neck: no adenopathy, no JVD, supple, symmetrical, trachea midline and thyroid not enlarged, symmetric, no tenderness/mass/nodules Lymph nodes: Cervical, supraclavicular, and axillary nodes normal. Resp: clear to auscultation bilaterally Back: symmetric, no curvature. ROM  normal. No CVA tenderness. Cardio: regular rate and rhythm, S1, S2 normal, no murmur, click, rub or gallop GI: soft, non-tender; bowel sounds normal; no masses,  no organomegaly Extremities: extremities normal, atraumatic, no cyanosis or edema Neurologic: Alert and oriented X 3, normal strength and tone. Normal symmetric reflexes. Normal coordination and gait  ECOG PERFORMANCE STATUS: 1 - Symptomatic but   completely ambulatory  Blood pressure 135/74, pulse 70, temperature 98 F (36.7 C), temperature source Oral, resp. rate 18, weight 154 lb 3.2 oz (69.9 kg), SpO2 99 %.  LABORATORY DATA: Lab Results  Component Value Date   WBC 6.1 01/05/2016   HGB 10.5 (L) 01/05/2016   HCT 32.3 (L) 01/05/2016   MCV 113.7 (H) 01/05/2016   PLT 104 (L) 01/05/2016      Chemistry      Component Value Date/Time   NA 140 01/05/2016 1031   K 4.3 01/05/2016 1031   CL 106 11/08/2015 1013   CL 105 03/19/2012 0811   CO2 24 01/05/2016 1031   BUN 37.6 (H) 01/05/2016 1031   CREATININE 3.1 (HH) 01/05/2016 1031      Component Value Date/Time   CALCIUM 9.9 01/05/2016 1031   ALKPHOS 129 01/05/2016 1031   AST 18 01/05/2016 1031   ALT 21 01/05/2016 1031   BILITOT 0.37 01/05/2016 1031       RADIOGRAPHIC STUDIES: Ct Head Wo Contrast  Result Date: 12/22/2015 CLINICAL DATA:  Cryptococcal pneumonitis.  Pre lumbar puncture. EXAM: CT HEAD WITHOUT CONTRAST TECHNIQUE: Contiguous axial images were obtained from the base of the skull through the vertex without intravenous contrast. COMPARISON:  03/23/2011 FINDINGS: Brain: Mild low-density throughout the deep white matter, likely chronic small vessel disease. No acute intracranial abnormality. Specifically, no hemorrhage, hydrocephalus, mass lesion, acute infarction, or significant intracranial injury. Vascular: No hyperdense vessel or unexpected calcification. Skull: No acute calvarial abnormality. Sinuses/Orbits: Mucosal thickening throughout the right maxillary sinus  with associated air-fluid levels. Remainder the paranasal sinuses and mastoid air cells are clear. Other: None IMPRESSION: Mild chronic small vessel disease throughout the deep white matter. Acute on chronic right maxillary sinusitis. Electronically Signed   By: Kevin  Dover M.D.   On: 12/22/2015 16:52   Ct Chest Wo Contrast  Result Date: 01/10/2016 CLINICAL DATA:  Multiple myeloma with cryptococcal pneumonia. EXAM: CT CHEST WITHOUT CONTRAST TECHNIQUE: Multidetector CT imaging of the chest was performed following the standard protocol without IV contrast. COMPARISON:  Chest CT 09/26/2015 FINDINGS: Cardiovascular: The heart size is normal. No pericardial effusion. Coronary artery calcification is noted. Atherosclerotic calcification is noted in the wall of the thoracic aorta. Right Port-A-Cath tip positioned at the SVC/ RA junction. Mediastinum/Nodes: Stable appearance borderline enlarged mediastinal lymph nodes. No evidence for gross hilar lymphadenopathy although assessment is limited by the lack of intravenous contrast on today's study. The esophagus has normal imaging features. There is no axillary lymphadenopathy. Lungs/Pleura: Centrilobular emphysema noted bilaterally. The lingular lesion has decreased in size in the interval, measuring 2.1 x 1.7 cm today compared to 3.8 x 3.0 cm previously. The lesion is also less confluent with areas of scattered cavitation today. Retraction of the major fissure towards the lesion is slightly more prominent. Peribronchovascular nodularity in the right upper lobe (See image 42 series 7) has progressed minimally in the interval. 2-3 mm posterior right lower lobe pulmonary nodule (image 116 series 7) is stable no evidence pleural effusion. Upper Abdomen: 1.6 cm left adrenal nodule is stable and showed no hypermetabolism on PET-CT of 10/11/2015. Kidney show scattered areas of cortical scarring bilaterally. Atherosclerotic calcification noted in the wall of the abdominal  aorta, incompletely visualized. Musculoskeletal: Bone windows reveal no worrisome lytic or sclerotic osseous lesions. Patient status post left mastectomy and left axillary lymph node dissection. IMPRESSION: 1. Interval decrease in size and confluency of the lingular lesion although it is not yet resolved. 2. Minimal interval increase in   peribronchovascular nodularity posterior right upper lobe, compatible with atypical infection. 3. Stable 1.6 cm left adrenal nodule shown previously to have no hypermetabolism. This is likely benign adrenal adenoma. 4.  Emphysema. (ICD10-J43.9) 5.  Abdominal Aortic Atherosclerois (ICD10-170.0) Electronically Signed   By: Eric  Mansell M.D.   On: 01/10/2016 10:32   Dg Fluoro Guided Loc Of Needle/cath Tip For Spinal Inject Lt  Result Date: 12/26/2015 CLINICAL DATA:  Cryptococcal pneumonia.  Breast cancer. EXAM: DIAGNOSTIC LUMBAR PUNCTURE UNDER FLUOROSCOPIC GUIDANCE FLUOROSCOPY TIME:  Fluoroscopy Time:  30 seconds Number of Acquired Spot Images: 0 PROCEDURE: Informed consent was obtained from the patient prior to the procedure, including potential complications of headache, allergy, and pain. With the patient prone, the lower back was prepped with Betadine. 1% Lidocaine was used for local anesthesia. Lumbar puncture was performed at the L2-3 level using a 22 gauge needle with return of clear CSF with an opening pressure of 15 cm water. Nine ml of CSF were obtained for laboratory studies. The patient tolerated the procedure well and there were no apparent complications. IMPRESSION: 1. Successful lumbar puncture. Electronically Signed   By: Taylor  Stroud M.D.   On: 12/26/2015 10:02    ASSESSMENT AND PLAN: This is a very pleasant 75 years old white female with: 1) relapsed multiple myeloma status post several chemotherapy regimens and most recently treated with systemic chemotherapy with Carfilzomib, Cytoxan and Decadron. She is currently on observation and her recent myeloma  panel showed no clear evidence for disease progression. I discussed the lab result with the patient today. I recommended for her to continue on observation with repeat myeloma panel in 3 months for reevaluation of her disease. 2) cryptococcal infection of the left lung: She had a recent CT scan of the chest that showed some improvement of her disease. She will continue with the current treatment with Diflucan for now. 3) chronic renal insufficiency: She is followed by nephrology. 4) anemia of chronic disease and thrombocytopenia: Better compared to 3 months ago. We will continue to monitor closely and consider the patient for transfusion if needed. The patient was advised to call immediately if she has any concerning symptoms in the interval. The patient voices understanding of current disease status and treatment options and is in agreement with the current care plan.  All questions were answered. The patient knows to call the clinic with any problems, questions or concerns. We can certainly see the patient much sooner if necessary.  Disclaimer: This note was dictated with voice recognition software. Similar sounding words can inadvertently be transcribed and may not be corrected upon review.       

## 2016-01-12 NOTE — Telephone Encounter (Signed)
Appointments scheduled per 12/28 LOS. Patient given AVS report and calendars with future scheduled appointments.

## 2016-01-19 ENCOUNTER — Encounter: Payer: Self-pay | Admitting: Internal Medicine

## 2016-01-19 ENCOUNTER — Ambulatory Visit (INDEPENDENT_AMBULATORY_CARE_PROVIDER_SITE_OTHER): Payer: Medicare Other | Admitting: Internal Medicine

## 2016-01-19 DIAGNOSIS — R110 Nausea: Secondary | ICD-10-CM | POA: Diagnosis not present

## 2016-01-19 DIAGNOSIS — B450 Pulmonary cryptococcosis: Secondary | ICD-10-CM

## 2016-01-19 DIAGNOSIS — B3731 Acute candidiasis of vulva and vagina: Secondary | ICD-10-CM

## 2016-01-19 DIAGNOSIS — B373 Candidiasis of vulva and vagina: Secondary | ICD-10-CM | POA: Diagnosis not present

## 2016-01-19 NOTE — Addendum Note (Signed)
Encounter addended by: Baruch Goldmann on: 01/19/2016  1:15 PM<BR>    Actions taken: Imaging Exam ended

## 2016-01-19 NOTE — Addendum Note (Signed)
Encounter addended by: Tommas Olp on: 01/19/2016  1:43 PM<BR>    Actions taken: Imaging Exam ended

## 2016-01-19 NOTE — Addendum Note (Signed)
Encounter addended by: Tommas Olp on: 01/19/2016  1:41 PM<BR>    Actions taken: Imaging Exam ended, Charge Capture section accepted

## 2016-01-20 ENCOUNTER — Other Ambulatory Visit: Payer: Self-pay | Admitting: Pharmacist Clinician (PhC)/ Clinical Pharmacy Specialist

## 2016-01-20 DIAGNOSIS — B373 Candidiasis of vulva and vagina: Secondary | ICD-10-CM | POA: Insufficient documentation

## 2016-01-20 DIAGNOSIS — B3731 Acute candidiasis of vulva and vagina: Secondary | ICD-10-CM | POA: Insufficient documentation

## 2016-01-20 MED ORDER — TERCONAZOLE 0.4 % VA CREA: 1.0000 | TOPICAL_CREAM | Freq: Every day | VAGINAL | 2 refills | Status: DC

## 2016-01-20 MED ORDER — CLOTRIMAZOLE 2 % VA CREA: 1.0000 | TOPICAL_CREAM | Freq: Every day | VAGINAL | 2 refills | Status: DC

## 2016-01-20 NOTE — Progress Notes (Unsigned)
Her insurance doesn't cover the clotrimazole so I changed t terconazole since it's on their formulary.

## 2016-01-20 NOTE — Assessment & Plan Note (Signed)
Seems to be related to fluconazole.  She has zofran and will take prior to fluconazole as advised by our pharmacist who counseled her at appt.

## 2016-01-20 NOTE — Assessment & Plan Note (Signed)
I will have her try clotrimazole topical.

## 2016-01-20 NOTE — Assessment & Plan Note (Addendum)
She will continue with fluconazole, other alternatives include voriconazole which typically has more side effects or posaconazole but at this time she will continue with fluconazole.  Will aim for 6 months (through April) if opacity on CT chest resolves Repeat CT in 3 months and she can follow up after.  She thinks Dr. Julien Nordmann is going to do another CT at that time.

## 2016-01-20 NOTE — Progress Notes (Signed)
   Subjective:    Patient ID: Nancy Norris, female    DOB: [birth year], 76 y.o.   MRN: [id number]  HPI Here for follow up of cryptococcal pneumonia.  I saw her as a new patient on 11/27 and she was already on 200 mg fluconazole (renally dosed) for over 1 month.  She was getting chemotherapy for multiple myeloma but has since seen Dr. Julien Nordmann who does not feel she needs chemo at this time again.  She had a repeat CT scan of her chest and the area of concern has decreased from 3.8x3.0 to 2.1x1.7.  She is having some difficulty with nausea with the fluconazole and does take zofran but it persists.  She also has a vaginal yeast infection.  She had an LP to rule out cryptococcal meningitis and findings not c/w infection including cryptococcal Ag negative in CSF.   CT chest independently reviewed and confirmed area decrease.    Review of Systems  Constitutional: Negative for chills, fatigue and fever.  Respiratory: Negative for cough and shortness of breath.   Gastrointestinal: Positive for nausea. Negative for diarrhea.  Skin: Negative for rash.  Neurological: Negative for dizziness and light-headedness.       Objective:   Physical Exam  Constitutional: She appears well-developed and well-nourished.  Eyes: No scleral icterus.  Cardiovascular: Normal rate, regular rhythm and normal heart sounds.   No murmur heard. Pulmonary/Chest: Effort normal and breath sounds normal. She has no wheezes.  Skin: No rash noted.    SH: no alcohol      Assessment & Plan:

## 2016-01-24 LAB — FUNGAL ORGANISM REFLEX

## 2016-01-24 LAB — FUNGUS CULTURE WITH STAIN

## 2016-01-24 LAB — FUNGUS CULTURE RESULT

## 2016-01-30 DIAGNOSIS — N184 Chronic kidney disease, stage 4 (severe): Secondary | ICD-10-CM | POA: Diagnosis not present

## 2016-02-20 ENCOUNTER — Ambulatory Visit (INDEPENDENT_AMBULATORY_CARE_PROVIDER_SITE_OTHER): Payer: Medicare Other | Admitting: Internal Medicine

## 2016-02-20 ENCOUNTER — Encounter: Payer: Self-pay | Admitting: Internal Medicine

## 2016-02-20 VITALS — BP 124/60 | HR 64 | Temp 97.8°F | Resp 16 | Ht 65.0 in | Wt 158.0 lb

## 2016-02-20 DIAGNOSIS — I951 Orthostatic hypotension: Secondary | ICD-10-CM

## 2016-02-20 DIAGNOSIS — E559 Vitamin D deficiency, unspecified: Secondary | ICD-10-CM

## 2016-02-20 DIAGNOSIS — J0140 Acute pansinusitis, unspecified: Secondary | ICD-10-CM | POA: Diagnosis not present

## 2016-02-20 DIAGNOSIS — R7303 Prediabetes: Secondary | ICD-10-CM | POA: Diagnosis not present

## 2016-02-20 DIAGNOSIS — E782 Mixed hyperlipidemia: Secondary | ICD-10-CM | POA: Diagnosis not present

## 2016-02-20 DIAGNOSIS — Z79899 Other long term (current) drug therapy: Secondary | ICD-10-CM

## 2016-02-20 LAB — BASIC METABOLIC PANEL WITH GFR
BUN: 34 mg/dL — ABNORMAL HIGH (ref 7–25)
CALCIUM: 8.8 mg/dL (ref 8.6–10.4)
CO2: 23 mmol/L (ref 20–31)
Chloride: 106 mmol/L (ref 98–110)
Creat: 3.17 mg/dL — ABNORMAL HIGH (ref 0.60–0.93)
GFR, EST AFRICAN AMERICAN: 16 mL/min — AB (ref >=60)
GFR, Est Non African American: 14 mL/min — ABNORMAL LOW (ref >=60)
GLUCOSE: 85 mg/dL (ref 65–99)
POTASSIUM: 3.7 mmol/L (ref 3.5–5.3)
Sodium: 140 mmol/L (ref 135–146)

## 2016-02-20 LAB — CBC WITH DIFFERENTIAL/PLATELET
Basophils Absolute: 0 cells/uL (ref 0–200)
Basophils Relative: 0 %
EOS ABS: 48 {cells}/uL (ref 15–500)
Eosinophils Relative: 1 %
HEMATOCRIT: 31.2 % — AB (ref 35.0–45.0)
Hemoglobin: 10.3 g/dL — ABNORMAL LOW (ref 11.7–15.5)
LYMPHS PCT: 28 %
Lymphs Abs: 1344 cells/uL (ref 850–3900)
MCH: 35.3 pg — ABNORMAL HIGH (ref 27.0–33.0)
MCHC: 33 g/dL (ref 32.0–36.0)
MCV: 106.8 fL — AB (ref 80.0–100.0)
MONO ABS: 1104 {cells}/uL — AB (ref 200–950)
MPV: 10.5 fL (ref 7.5–12.5)
Monocytes Relative: 23 %
NEUTROS PCT: 48 %
Neutro Abs: 2304 cells/uL (ref 1500–7800)
Platelets: 98 10*3/uL — ABNORMAL LOW (ref 140–400)
RBC: 2.92 MIL/uL — ABNORMAL LOW (ref 3.80–5.10)
RDW: 13.5 % (ref 11.0–15.0)
WBC: 4.8 10*3/uL (ref 3.8–10.8)

## 2016-02-20 LAB — HEPATIC FUNCTION PANEL
ALBUMIN: 4.1 g/dL (ref 3.6–5.1)
ALK PHOS: 90 U/L (ref 33–130)
ALT: 26 U/L (ref 6–29)
AST: 32 U/L (ref 10–35)
Bilirubin, Direct: 0.2 mg/dL (ref <=0.2)
Indirect Bilirubin: 0.8 mg/dL (ref 0.2–1.2)
TOTAL PROTEIN: 5.8 g/dL — AB (ref 6.1–8.1)
Total Bilirubin: 1 mg/dL (ref 0.2–1.2)

## 2016-02-20 LAB — HEMOGLOBIN A1C
HEMOGLOBIN A1C: 5 % (ref <5.7)
Mean Plasma Glucose: 97 mg/dL

## 2016-02-20 LAB — TSH: TSH: 1.42 mIU/L

## 2016-02-20 MED ORDER — CEPHALEXIN 500 MG PO CAPS: ORAL_CAPSULE | ORAL | 1 refills | Status: AC

## 2016-02-20 NOTE — Progress Notes (Signed)
[facility] [name], M.D.        Uvaldo Bristle. Silverio Lay, P.A.-C       Starlyn Skeans, P.A.-C  [facility]                [address] [place] [address] Telephone [phone] Telefax [phone] ______________________________________________________________________     This very nice 76 y.o.  DWF presents for  follow up with hx/o labile HTN, Hyperlipidemia, Pre-Diabetes and Vitamin D Deficiency.      Patient currently following with Dr Linus Salmons on Diflucan for Cryptococcal lung abscess. Patient has been treated for MM predating since 2002 and had autologous stem cell transplant in 2010. In 2008 she had a partial L Mastectomy for DCIS. Other problems include ESRD/CKD5  (GFR 14 in Dec) followed by Dr Marval Regal and altho she did concede and had a L Brachial AVF, she now declines consideration for Dialysis or CPR/Life Support.        Patient has hx/o labile HTN later evolving into dysautonomia with postural hypotension and currently is stable on Midodrine.  Today's BP is at goal - 124/60. Patient has had no complaints of any cardiac type chest pain, palpitations, dyspnea/orthopnea/PND, dizziness, claudication, or dependent edema.     Hyperlipidemia is controlled with diet. Patient denies myalgias or other med SE's. Last Lipids were at goal albeit sl elevated Trig's: Lab Results  Component Value Date   CHOL 157 05/27/2015   HDL 39 (L) 05/27/2015   LDLCALC 80 05/27/2015   TRIG 188 (H) 05/27/2015   CHOLHDL 4.0 05/27/2015      Also, the patient has history of PreDiabetes with A1c 5.8% in 2012 and has had no symptoms of reactive hypoglycemia, diabetic polys, paresthesias or visual blurring.  Last A1c was at goal: Lab Results  Component Value Date   HGBA1C 5.4 05/27/2015      Further, the patient also has history of Vitamin D Deficiency ( "46" on treatment in 2012) and supplements vitamin D without  any suspected side-effects. Last vitamin D was at goal:  Lab Results  Component Value Date   VD25OH 61 05/27/2015   Current Outpatient Prescriptions on File Prior to Visit  Medication Sig  . acetaminophen  325 MG tablet Take 650 mg  every 6 hours as needed for mild pain   . PROVENTIL HFA inhaler 1-2 puffs every 6  hours as needed   . ASTELIN  nasal spray Place 2 sprays into both nostrils 2times daily.   Marland Kitchen buPROPion-XL 300 MG 24 hr tablet TAKE 1 TABLET BY MOUTH EACH MORNING  . butalbital-acetaminophen-caffeine (FIORICET, ESGIC) 50-325-40 MG tablet TAKE 1 OR 2 TABLETS BY MOUTH EVERY 4 HOURS AS NEEDED FOR PAIN OR HEADACHE.  . cetirizine (ZYRTEC) 10 MG tablet Take 10 mg by mouth daily.   . Cholecalciferol 4000 UNITS TABS Take 4,000 Units by mouth daily.  . citalopram (CELEXA) 40 MG tablet Take 1 tablet daily or as directed  . cromolyn (OPTICROM) 4 % ophthalmic solution Place 1 drop into both eyes 4 (four) times daily.  . ferrous sulfate 325 (65 FE) MG tablet Take 1 tablet daily or as directed  . DIFLUCAN 200 MG tablet Take 1 tablet (200 mg total) by mouth daily.  Marland Kitchen FLONASE nasal spray Place 2 sprays into both nostrils daily.  Marland Kitchen BREO ELLIPTA 100-25 Inhale 1 puff into the lungs daily.  Marland Kitchen  gabapentin  300 MG capsule TAKE 1 CAPSULE BY MOUTH AT BEDTIME.  . isosorbide  (IMDUR) 30 MG  Take 1 tablet (30 mg total) by mouth daily.  Marland Kitchen levothyroxine 150 MCG tablet TAKE 1 TABLET BY MOUTH ONCE DAILY.  Marland Kitchen loratadine (CLARITIN) 10 MG tablet Take 10 mg by mouth daily.  Marland Kitchen LORazepam (ATIVAN) 0.5 MG tablet Take 1 tab every 8 hours - nausea  . loteprednol (ALREX) 0.2 % SUSP 1 drop into both eyes 4  times daily.  . magic mouthwash SOLN Take 5 mLs  times daily as needed   . meloxicam 15 MG tablet Take 1 tablet (15 mg total) by mouth daily.  . midodrine  10 MG tablet TAKE 1 TABLET BY MOUTH 3 TIMES DAILY.  . montelukast  10 MG tablet Take 1 tablet (10 mg total) by mouth daily.  Marland Kitchen NITROSTAT 0.4 MG SL tablet as needed  for chest pain  . nystatinpowder Apply topically 2 (two) times daily.  . pantoprazole  40 MG tablet TAKE 1 TABLET DAILY FOR ACID REFLUX & INDIGESTION  . promethazine  25 MG tablet Take 25 mg by mouth.  . ranitidine  300 MG tablet  Take 300 mg by mouth at bedtime.   Donavan Burnet Take 1 tablet by mouth at bedtime.  . TERAZOL 7 0.4 % vaginal crm Place 1 applicator vaginally at bedtime.  . traMADol (ULTRAM) 50 MG tablet Take 1 to 2 tab 4 times a day as needed  . vitamin C (ASCORBIC ACID) 250 MG tablet Take 250 mg by mouth daily.   Allergies  Allergen Reactions  . Codeine Anaphylaxis  . Latex Shortness Of Breath    Adhesive products   . Other Other (See Comments)    Onion, chocolate causes migraines  . Onion Other (See Comments)    Causes migraine headaches  . Zyprexa [Olanzapine] Other (See Comments)    Confusion , dizzy,unsteady  . Adhesive [Tape] Other (See Comments)    blisters  . Hydrocodone Rash    With extreme itching  . Iodinated Diagnostic Agents Itching, Rash and Hives    Happened 60 years ago  . Sulfa Antibiotics Itching   PMHx:   Past Medical History:  Diagnosis Date  . Anemia   . Anginal pain ([place])    used NTG x 2 May 31 and 06/15/13   . Anxiety   . Arthritis   . B12 deficiency 12/04/2014  . Breast cancer ([place])   . CKD (chronic kidney disease) stage 3, GFR 30-59 ml/min   . Complication of anesthesia   . COPD (chronic obstructive pulmonary disease) ([place])   . Cryptococcal pneumonitis ([place]) 11/22/2015  . Depression   . Dizziness   . Dyspnea   . Fibromyalgia   . Fibromyalgia   . GERD (gastroesophageal reflux disease)   . Headache(784.0)   . Heart murmur   . Hemoptysis 10/21/2015  . History of blood transfusion    last one May 12   . Hx of cardiovascular stress test    LexiScan with low level exercise Myoview (02/2013): No ischemia, EF 72%; normal study  . Hx of echocardiogram    a.  Echocardiogram (12/26/2012): EF 38-75%, grade 1 diastolic dysfunction;   b.   Echocardiogram (02/2013): EF 55-60%, no WMA, trivial effusion  . Hyperkalemia   . Hyponatremia   . Hypothyroidism   . Mucositis   . Multiple myeloma   . Myocardial infarction    in past, patient was unaware.   . Neuropathy (  [place])   . Pain in joint, pelvic region and thigh 07/07/2015  . PONV (postoperative nausea and vomiting) 2008   after mastestomy   Immunization History  Administered Date(s) Administered  . Influenza, High Dose Seasonal PF 09/30/2013  . Influenza,inj,Quad PF,36+ Mos 09/29/2015  . Influenza-Unspecified 11/05/2012, 11/03/2014  . Pneumococcal Conjugate-13 10/05/2013  . Pneumococcal-Unspecified 03/16/2011  . Tdap 10/14/2009   Past Surgical History:  Procedure Laterality Date  . ABDOMINAL HYSTERECTOMY  1981  . AV FISTULA PLACEMENT Left 06/19/2013   Procedure: CREATION OF LEFT ARM ARTERIOVENOUS (AV) FISTULA ;  Surgeon: Angelia Mould, MD;  Location: [facility];  Service: Vascular;  Laterality: Left;  . BREAST RECONSTRUCTION    . BREAST SURGERY    . CATARACT EXTRACTION, BILATERAL    . CHOLECYSTECTOMY  1971  . EYE SURGERY Bilateral    lens implant  . history of Port removal    . LUNG BIOPSY  10/21/2015  . MASTECTOMY Left 2008  . PORTACATH PLACEMENT  12/2012   has had 2  . Status post stem cell transplant on September 28, 2008.     FHx:    Reviewed / unchanged  SHx:    Reviewed / unchanged  Systems Review:  Constitutional: Denies fever, chills, wt changes, headaches, insomnia, fatigue, night sweats, change in appetite. Eyes: Denies redness, blurred vision, diplopia, discharge, itchy, watery eyes.  ENT: Denies discharge, congestion, post nasal drip, epistaxis, sore throat, earache, hearing loss, dental pain, tinnitus, vertigo, sinus pain, snoring.  CV: Denies chest pain, palpitations, irregular heartbeat, syncope, dyspnea, diaphoresis, orthopnea, PND, claudication or edema. Respiratory: denies cough, dyspnea, DOE, pleurisy, hoarseness, laryngitis, wheezing.   Gastrointestinal: Denies dysphagia, odynophagia, heartburn, reflux, water brash, abdominal pain or cramps, nausea, vomiting, bloating, diarrhea, constipation, hematemesis, melena, hematochezia  or hemorrhoids. Genitourinary: Denies dysuria, frequency, urgency, nocturia, hesitancy, discharge, hematuria or flank pain. Musculoskeletal: Denies arthralgias, myalgias, stiffness, jt. swelling, pain, limping or strain/sprain.  Skin: Denies pruritus, rash, hives, warts, acne, eczema or change in skin lesion(s). Neuro: No weakness, tremor, incoordination, spasms, paresthesia or pain. Psychiatric: Denies confusion, memory loss or sensory loss. Endo: Denies change in weight, skin or hair change.  Heme/Lymph: No excessive bleeding, bruising or enlarged lymph nodes.  Physical Exam  BP 124/60   Pulse 64   Temp 97.8 F (36.6 C)   Resp 16   Ht _0  (1.651 m)   Wt 158 lb (71.7 kg)   BMI 26.29 kg/m   Appears well nourished and in no distress.  Eyes: PERRLA, EOMs, conjunctiva no swelling or erythema. Sinuses: Has Rt frontal/maxillary tenderness ENT/Mouth: EAC's clear, TM's nl w/o erythema, bulging. Nares clear w/o erythema, swelling, exudates. Oropharynx clear without erythema or exudates. Oral hygiene is good. Tongue normal, non obstructing. Hearing intact.  Neck: Supple. Thyroid nl. Car 2+/2+ without bruits, nodes or JVD. Chest: Respirations nl with BS clear & equal w/o rales, rhonchi, wheezing or stridor.  Cor: Heart sounds normal w/ regular rate and rhythm without sig. murmurs, gallops, clicks, or rubs. Peripheral pulses normal and equal  without edema.  Abdomen: Soft & bowel sounds normal. Non-tender w/o guarding, rebound, hernias, masses, or organomegaly.  Lymphatics: Unremarkable.  Musculoskeletal: Full ROM all peripheral extremities, joint stability, 5/5 strength, and normal gait.  Skin: Warm, dry without exposed rashes, lesions or ecchymosis apparent.  Neuro: Cranial nerves intact, reflexes  equal bilaterally. Sensory-motor testing grossly intact. Tendon reflexes grossly intact.  Pysch: Alert & oriented x 3.  Insight and judgement nl & appropriate. No ideations.  Assessment and Plan:  1. Orthostatic hypotension   -Continue medication, monitor blood pressure at home.  - Continue DASH diet. Reminder to go to the ER if any CP,  SOB, nausea, dizziness, severe HA, changes vision/speech,  left arm numbness and tingling and jaw pain.  - TSH  2. Mixed hyperlipidemia  - Continue diet/meds, exercise,& lifestyle modifications.  - Continue monitor periodic cholesterol/liver & renal functions   - Hepatic function panel - TSH  3. Prediabetes  - Continue diet, exercise, lifestyle modifications.  - Monitor appropriate labs.  - Hemoglobin A1c - Insulin, random  4. Vitamin D deficiency  - Continue supplementation. - VITAMIN D 25 Hydroxy   5. Medication management  - CBC with Differential/Platelet - BASIC METABOLIC PANEL WITH GFR - Hepatic function panel - Magnesium - TSH - VITAMIN D 25 Hydroxy   6. Acute pansinusitis  - cephALEXin (KEFLEX) 500 MG capsule; Take 1 capsule 4 x / day with food for infection  Dispense: 60 capsule; Refill: 1       Recommended regular exercise, BP monitoring, weight control, and discussed med and SE's. Recommended labs to assess and monitor clinical status. Further disposition pending results of labs. Over 30 minutes of exam, counseling, chart review was performed

## 2016-02-20 NOTE — Patient Instructions (Signed)

## 2016-02-21 LAB — MAGNESIUM: Magnesium: 2.3 mg/dL (ref 1.5–2.5)

## 2016-02-21 LAB — VITAMIN D 25 HYDROXY (VIT D DEFICIENCY, FRACTURES): Vit D, 25-Hydroxy: 65 ng/mL (ref 30–100)

## 2016-02-21 LAB — INSULIN, RANDOM: Insulin: 5.8 u[IU]/mL (ref 2.0–19.6)

## 2016-02-28 ENCOUNTER — Other Ambulatory Visit: Payer: Self-pay | Admitting: Internal Medicine

## 2016-02-28 ENCOUNTER — Other Ambulatory Visit: Payer: Self-pay | Admitting: *Deleted

## 2016-02-28 MED ORDER — LOPERAMIDE HCL 2 MG PO CAPS: 2.0000 mg | ORAL_CAPSULE | ORAL | 2 refills | Status: AC | PRN

## 2016-03-05 DIAGNOSIS — N184 Chronic kidney disease, stage 4 (severe): Secondary | ICD-10-CM | POA: Diagnosis not present

## 2016-03-05 DIAGNOSIS — N189 Chronic kidney disease, unspecified: Secondary | ICD-10-CM | POA: Diagnosis not present

## 2016-03-21 DIAGNOSIS — I5030 Unspecified diastolic (congestive) heart failure: Secondary | ICD-10-CM | POA: Diagnosis not present

## 2016-03-21 DIAGNOSIS — B450 Pulmonary cryptococcosis: Secondary | ICD-10-CM | POA: Diagnosis not present

## 2016-03-21 DIAGNOSIS — I951 Orthostatic hypotension: Secondary | ICD-10-CM | POA: Diagnosis not present

## 2016-03-21 DIAGNOSIS — N2581 Secondary hyperparathyroidism of renal origin: Secondary | ICD-10-CM | POA: Diagnosis not present

## 2016-03-21 DIAGNOSIS — R911 Solitary pulmonary nodule: Secondary | ICD-10-CM | POA: Diagnosis not present

## 2016-03-21 DIAGNOSIS — R809 Proteinuria, unspecified: Secondary | ICD-10-CM | POA: Diagnosis not present

## 2016-03-21 DIAGNOSIS — C9000 Multiple myeloma not having achieved remission: Secondary | ICD-10-CM | POA: Diagnosis not present

## 2016-03-21 DIAGNOSIS — I770 Arteriovenous fistula, acquired: Secondary | ICD-10-CM | POA: Diagnosis not present

## 2016-03-21 DIAGNOSIS — R079 Chest pain, unspecified: Secondary | ICD-10-CM | POA: Diagnosis not present

## 2016-03-21 DIAGNOSIS — N184 Chronic kidney disease, stage 4 (severe): Secondary | ICD-10-CM | POA: Diagnosis not present

## 2016-03-21 DIAGNOSIS — D630 Anemia in neoplastic disease: Secondary | ICD-10-CM | POA: Diagnosis not present

## 2016-03-21 DIAGNOSIS — D631 Anemia in chronic kidney disease: Secondary | ICD-10-CM | POA: Diagnosis not present

## 2016-04-03 ENCOUNTER — Other Ambulatory Visit: Payer: Self-pay | Admitting: Internal Medicine

## 2016-04-05 ENCOUNTER — Other Ambulatory Visit (HOSPITAL_BASED_OUTPATIENT_CLINIC_OR_DEPARTMENT_OTHER): Payer: Medicare Other

## 2016-04-05 DIAGNOSIS — C9000 Multiple myeloma not having achieved remission: Secondary | ICD-10-CM | POA: Diagnosis not present

## 2016-04-05 LAB — COMPREHENSIVE METABOLIC PANEL
ALT: 17 U/L (ref 0–55)
AST: 19 U/L (ref 5–34)
Albumin: 4.3 g/dL (ref 3.5–5.0)
Alkaline Phosphatase: 98 U/L (ref 40–150)
Anion Gap: 12 mEq/L — ABNORMAL HIGH (ref 3–11)
BILIRUBIN TOTAL: 1.01 mg/dL (ref 0.20–1.20)
BUN: 39.6 mg/dL — ABNORMAL HIGH (ref 7.0–26.0)
CHLORIDE: 106 meq/L (ref 98–109)
CO2: 23 mEq/L (ref 22–29)
CREATININE: 3.3 mg/dL — AB (ref 0.6–1.1)
Calcium: 10 mg/dL (ref 8.4–10.4)
EGFR: 13 mL/min/{1.73_m2} — ABNORMAL LOW (ref >90)
GLUCOSE: 94 mg/dL (ref 70–140)
Potassium: 4.2 mEq/L (ref 3.5–5.1)
SODIUM: 140 meq/L (ref 136–145)
Total Protein: 6.9 g/dL (ref 6.4–8.3)

## 2016-04-05 LAB — CBC WITH DIFFERENTIAL/PLATELET
BASO%: 0.3 % (ref 0.0–2.0)
BASOS ABS: 0 10*3/uL (ref 0.0–0.1)
EOS%: 2.6 % (ref 0.0–7.0)
Eosinophils Absolute: 0.2 10*3/uL (ref 0.0–0.5)
HEMATOCRIT: 33.7 % — AB (ref 34.8–46.6)
HGB: 11.1 g/dL — ABNORMAL LOW (ref 11.6–15.9)
LYMPH#: 1.4 10*3/uL (ref 0.9–3.3)
LYMPH%: 18.2 % (ref 14.0–49.7)
MCH: 35 pg — AB (ref 25.1–34.0)
MCHC: 32.9 g/dL (ref 31.5–36.0)
MCV: 106.3 fL — ABNORMAL HIGH (ref 79.5–101.0)
MONO#: 1 10*3/uL — ABNORMAL HIGH (ref 0.1–0.9)
MONO%: 13.3 % (ref 0.0–14.0)
NEUT#: 5.1 10*3/uL (ref 1.5–6.5)
NEUT%: 65.6 % (ref 38.4–76.8)
Platelets: 133 10*3/uL — ABNORMAL LOW (ref 145–400)
RBC: 3.17 10*6/uL — AB (ref 3.70–5.45)
RDW: 14.2 % (ref 11.2–14.5)
WBC: 7.7 10*3/uL (ref 3.9–10.3)

## 2016-04-05 LAB — LACTATE DEHYDROGENASE: LDH: 168 U/L (ref 125–245)

## 2016-04-06 LAB — IGG, IGA, IGM
IgA, Qn, Serum: 12 mg/dL — ABNORMAL LOW (ref 64–422)
IgG, Qn, Serum: 389 mg/dL — ABNORMAL LOW (ref 700–1600)
IgM, Qn, Serum: 20 mg/dL — ABNORMAL LOW (ref 26–217)

## 2016-04-06 LAB — KAPPA/LAMBDA LIGHT CHAINS
IG LAMBDA FREE LIGHT CHAIN: 9.7 mg/L (ref 5.7–26.3)
Ig Kappa Free Light Chain: 25.7 mg/L — ABNORMAL HIGH (ref 3.3–19.4)
Kappa/Lambda FluidC Ratio: 2.65 — ABNORMAL HIGH (ref 0.26–1.65)

## 2016-04-06 LAB — BETA 2 MICROGLOBULIN, SERUM: Beta-2: 8.4 mg/L — ABNORMAL HIGH (ref 0.6–2.4)

## 2016-04-12 ENCOUNTER — Ambulatory Visit (HOSPITAL_BASED_OUTPATIENT_CLINIC_OR_DEPARTMENT_OTHER): Payer: Medicare Other | Admitting: Internal Medicine

## 2016-04-12 ENCOUNTER — Encounter: Payer: Self-pay | Admitting: Internal Medicine

## 2016-04-12 VITALS — BP 123/51 | HR 113 | Temp 98.5°F | Resp 18 | Wt 151.7 lb

## 2016-04-12 DIAGNOSIS — Z5111 Encounter for antineoplastic chemotherapy: Secondary | ICD-10-CM

## 2016-04-12 DIAGNOSIS — N185 Chronic kidney disease, stage 5: Secondary | ICD-10-CM | POA: Diagnosis not present

## 2016-04-12 DIAGNOSIS — D631 Anemia in chronic kidney disease: Secondary | ICD-10-CM | POA: Diagnosis not present

## 2016-04-12 DIAGNOSIS — C9000 Multiple myeloma not having achieved remission: Secondary | ICD-10-CM

## 2016-04-12 DIAGNOSIS — B450 Pulmonary cryptococcosis: Secondary | ICD-10-CM | POA: Diagnosis not present

## 2016-04-12 NOTE — Progress Notes (Signed)
[place] [phone]   Fax:([phone]) [place], MD [address] [place] [address]  DIAGNOSIS:  1. 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 Recurrent multiple myeloma initially diagnosed as a smoldering myeloma at [facility] in 2002. 3. Ductal carcinoma in situ status post mastectomy with sentinel lymph node biopsy in October 2008.  PRIOR THERAPY: 1. Status post treatment with tamoxifen from November 2008 through February 2009, discontinued secondary to intolerance. 2. Status post 3 cycles of chemotherapy with Revlimid and Decadron followed by 1 cycle of Decadron only with mild response. 3. Status post 2 cycles of systemic chemotherapy with Velcade, Doxil and Decadron discontinued secondary to significant peripheral neuropathy. Last dose was given May 2010 at [facility]. 4. Status post autologous peripheral blood stem cell transplant on October 01, 2008 at [facility] under the care of Dr. Phyllis Ginger.  5. Systemic chemotherapy with Carfilzomib initially was 20 mg/M2 and will be increased after cycle #1 to 36 mg/M2 on days 1, 2, 8, 9, 15 and 16 every 4 weeks in addition to Cytoxan 300 mg/M2 and Decadron 40 mg by mouth weekly basis, status post 4 cycles. First cycle on 12/29/2012. 6. She resumed chemotherapy with Carfilzomib, Cytoxan, and Decadron on 05/03/2014. Status post 6 cycles. 7. systemic chemotherapy again with Carfilzomib, Cytoxan and Decadron on 07/11/2015. Status post 3 cycles. 8. She completed a course of treatment with Diflucan 200 mg by mouth daily for around 6 weeks for cryptococcal infection of the lung.  CURRENT THERAPY: Resuming treatment again with Carfilzomib, Cytoxan and Decadron, first cycle 04/23/2016.  INTERVAL HISTORY: Nancy Norris 76 y.o. female returns to the clinic today for follow-up visit. The patient is feeling  fine today with no specific complaints except for fatigue. The patient has been observation for the last few months after she developed cryptococcal infection of the lung and she was on long-term treatment with Diflucan by infectious disease. She is feeling much better today and she discontinued her treatment with Diflucan. She is interested in resuming her treatment again with chemotherapy for multiple myeloma based on the recommendation of her nephrologist for deterioration of her renal function off treatment. She denied having any chest pain, shortness of breath, cough or hemoptysis. She has no fever or chills. She denied having any current nausea or vomiting. She had repeat myeloma panel performed recently and she is here for evaluation and discussion of her lab results and treatment options.  MEDICAL HISTORY: Past Medical History:  Diagnosis Date  . Anemia   . Anginal pain ([place])    used NTG x 2 May 31 and 06/15/13   . Anxiety   . Arthritis   . B12 deficiency 12/04/2014  . Breast cancer ([place])   . CKD (chronic kidney disease) stage 3, GFR 30-59 ml/min   . Complication of anesthesia   . COPD (chronic obstructive pulmonary disease) ([place])   . Cryptococcal pneumonitis ([place]) 11/22/2015  . Depression   . Dizziness   . Dyspnea   . Fibromyalgia   . Fibromyalgia   . GERD (gastroesophageal reflux disease)   . Headache(784.0)   . Heart murmur   . Hemoptysis 10/21/2015  . History of blood transfusion    last one May 12   . Hx of cardiovascular stress test    LexiScan with low level exercise Myoview (02/2013): No ischemia, EF 72%; normal study  .  Hx of echocardiogram    a.  Echocardiogram (12/26/2012): EF 99-24%, grade 1 diastolic dysfunction;   b.  Echocardiogram (02/2013): EF 55-60%, no WMA, trivial effusion  . Hyperkalemia   . Hyponatremia   . Hypothyroidism   . Mucositis   . Multiple myeloma   . Myocardial infarction    in past, patient was unaware.   . Neuropathy ([place])   . Pain in joint,  pelvic region and thigh 07/07/2015  . PONV (postoperative nausea and vomiting) 2008   after mastestomy    ALLERGIES:  is allergic to codeine; latex; other; onion; zyprexa [olanzapine]; adhesive [tape]; hydrocodone; iodinated diagnostic agents; and sulfa antibiotics.  MEDICATIONS:  Current Outpatient Prescriptions  Medication Sig Dispense Refill  . acetaminophen (TYLENOL) 325 MG tablet Take 650 mg by mouth every 6 (six) hours as needed for mild pain or moderate pain.     Marland Kitchen albuterol (PROVENTIL HFA;VENTOLIN HFA) 108 (90 Base) MCG/ACT inhaler Inhale 1-2 puffs into the lungs every 6 (six) hours as needed for wheezing or shortness of breath (cough). 1 Inhaler 3  . buPROPion (WELLBUTRIN XL) 300 MG 24 hr tablet TAKE 1 TABLET BY MOUTH EACH MORNING FOR MOOD AND DEPRESSION. 90 tablet 1  . butalbital-acetaminophen-caffeine (FIORICET, ESGIC) 50-325-40 MG tablet TAKE 1 OR 2 TABLETS BY MOUTH EVERY 4 HOURS AS NEEDED FOR PAIN OR HEADACHE. 50 tablet 3  . cetirizine (ZYRTEC) 10 MG tablet Take 10 mg by mouth daily.     . Cholecalciferol 4000 UNITS TABS Take 4,000 Units by mouth daily.    . citalopram (CELEXA) 40 MG tablet TAKE 1 TABLET BY MOUTH DAILY OR AS DIRECTED 90 tablet 1  . cromolyn (OPTICROM) 4 % ophthalmic solution Place 1 drop into both eyes 4 (four) times daily.    . fluconazole (DIFLUCAN) 200 MG tablet Take 1 tablet (200 mg total) by mouth daily. 30 tablet 1  . fluticasone (FLONASE) 50 MCG/ACT nasal spray Place 2 sprays into both nostrils daily. 16 g 0  . fluticasone furoate-vilanterol (BREO ELLIPTA) 100-25 MCG/INH AEPB Inhale 1 puff into the lungs daily. 28 each 0  . gabapentin (NEURONTIN) 300 MG capsule TAKE 1 CAPSULE BY MOUTH AT BEDTIME. 90 capsule 1  . isosorbide mononitrate (IMDUR) 30 MG 24 hr tablet Take 1 tablet (30 mg total) by mouth daily. 90 tablet 3  . levothyroxine (SYNTHROID, LEVOTHROID) 150 MCG tablet Take 1 tablet (150 mcg total) by mouth daily. (Patient taking differently: Take 150 mcg  by mouth daily. ) 90 tablet 1  . levothyroxine (SYNTHROID, LEVOTHROID) 150 MCG tablet TAKE 1 TABLET BY MOUTH ONCE DAILY. 90 tablet 1  . loperamide (IMODIUM) 2 MG capsule Take 1 capsule (2 mg total) by mouth as needed for diarrhea or loose stools. 30 capsule 2  . loratadine (CLARITIN) 10 MG tablet Take 10 mg by mouth daily.    Marland Kitchen LORazepam (ATIVAN) 0.5 MG tablet Take 1 tablet (0.5 mg total) by mouth every 8 (eight) hours as needed (nausea). 30 tablet 0  . loteprednol (ALREX) 0.2 % SUSP Place 1 drop into both eyes 4 (four) times daily. 1 Bottle PRN  . magic mouthwash SOLN Take 5 mLs by mouth 4 (four) times daily as needed for mouth pain. 240 mL 1  . meloxicam (MOBIC) 15 MG tablet Take 1 tablet (15 mg total) by mouth daily. 90 tablet 3  . midodrine (PROAMATINE) 10 MG tablet TAKE 1 TABLET BY MOUTH 3 TIMES DAILY. 270 tablet 3  . montelukast (SINGULAIR) 10 MG tablet TAKE  1 TABLET BY MOUTH DAILY. 30 tablet 2  . nitroGLYCERIN (NITROSTAT) 0.4 MG SL tablet Place 0.4 mg under the tongue every 5 (five) minutes as needed for chest pain. Reported on 03/30/2015    . nystatin (MYCOSTATIN/NYSTOP) powder Apply topically 2 (two) times daily. 30 g 0  . pantoprazole (PROTONIX) 40 MG tablet TAKE 1 TABLET DAILY FOR ACID REFLUX & INDIGESTION 90 tablet 1  . PRESCRIPTION MEDICATION Pt gets chemo at [facility].    Marland Kitchen promethazine (PHENERGAN) 25 MG tablet Take 25 mg by mouth.    . ranitidine (ZANTAC) 300 MG tablet Take 1 to 2 tablets daily for heartburn & reflux to allow wean and transition from PPI / pantoprazole (Patient taking differently: Take 300 mg by mouth at bedtime. Take 1 to 2 tablets daily for heartburn & reflux to allow wean and transition from PPI / pantoprazole) 180 tablet 3  . senna (SENOKOT) 8.6 MG tablet Take 1 tablet by mouth at bedtime.    Marland Kitchen terconazole (TERAZOL 7) 0.4 % vaginal cream Place 1 applicator vaginally at bedtime. 45 g 2  . traMADol (ULTRAM) 50 MG tablet Take 1 to 2 tablets by mouth 4 times a day as needed  for pain. 180 tablet 0  . vitamin C (ASCORBIC ACID) 250 MG tablet Take 250 mg by mouth daily.    Marland Kitchen azelastine (ASTELIN) 0.1 % nasal spray Place 2 sprays into both nostrils 2 (two) times daily. Use in each nostril as directed 30 mL 2  . ferrous sulfate 325 (65 FE) MG tablet Take 1 tablet daily or as directed 90 tablet 1   No current facility-administered medications for this visit.     SURGICAL HISTORY:  Past Surgical History:  Procedure Laterality Date  . ABDOMINAL HYSTERECTOMY  1981  . AV FISTULA PLACEMENT Left 06/19/2013   Procedure: CREATION OF LEFT ARM ARTERIOVENOUS (AV) FISTULA ;  Surgeon: Angelia Mould, MD;  Location: [facility];  Service: Vascular;  Laterality: Left;  . BREAST RECONSTRUCTION    . BREAST SURGERY    . CATARACT EXTRACTION, BILATERAL    . CHOLECYSTECTOMY  1971  . EYE SURGERY Bilateral    lens implant  . history of Port removal    . LUNG BIOPSY  10/21/2015  . MASTECTOMY Left 2008  . PORTACATH PLACEMENT  12/2012   has had 2  . Status post stem cell transplant on September 28, 2008.      REVIEW OF SYSTEMS:  Constitutional: positive for fatigue Eyes: negative Ears, nose, mouth, throat, and face: negative Respiratory: negative Cardiovascular: negative Gastrointestinal: negative Genitourinary:negative Integument/breast: negative Hematologic/lymphatic: negative Musculoskeletal:negative Neurological: negative Behavioral/Psych: negative Endocrine: negative Allergic/Immunologic: negative   PHYSICAL EXAMINATION: General appearance: alert, cooperative, fatigued and no distress Head: Normocephalic, without obvious abnormality, atraumatic Neck: no adenopathy, no JVD, supple, symmetrical, trachea midline and thyroid not enlarged, symmetric, no tenderness/mass/nodules Lymph nodes: Cervical, supraclavicular, and axillary nodes normal. Resp: clear to auscultation bilaterally Back: symmetric, no curvature. ROM normal. No CVA tenderness. Cardio: regular rate and  rhythm, S1, S2 normal, no murmur, click, rub or gallop GI: soft, non-tender; bowel sounds normal; no masses,  no organomegaly Extremities: extremities normal, atraumatic, no cyanosis or edema Neurologic: Alert and oriented X 3, normal strength and tone. Normal symmetric reflexes. Normal coordination and gait  ECOG PERFORMANCE STATUS: 1 - Symptomatic but completely ambulatory  Blood pressure (!) 123/51, pulse (!) 113, temperature 98.5 F (36.9 C), temperature source Oral, resp. rate 18, weight 151 lb 11.2 oz (68.8 kg), SpO2 96 %.  LABORATORY DATA: Lab Results  Component Value Date   WBC 7.7 04/05/2016   HGB 11.1 (L) 04/05/2016   HCT 33.7 (L) 04/05/2016   MCV 106.3 (H) 04/05/2016   PLT 133 (L) 04/05/2016      Chemistry      Component Value Date/Time   NA 140 04/05/2016 1043   K 4.2 04/05/2016 1043   CL 106 02/20/2016 1213   CL 105 03/19/2012 0811   CO2 23 04/05/2016 1043   BUN 39.6 (H) 04/05/2016 1043   CREATININE 3.3 (HH) 04/05/2016 1043      Component Value Date/Time   CALCIUM 10.0 04/05/2016 1043   ALKPHOS 98 04/05/2016 1043   AST 19 04/05/2016 1043   ALT 17 04/05/2016 1043   BILITOT 1.01 04/05/2016 1043       RADIOGRAPHIC STUDIES: No results found.  ASSESSMENT AND PLAN:  This is a very pleasant 76 years old white female with relapsed multiple myeloma status post several chemotherapy regimens and most recently has been on treatment with Carfilzomib, Cytoxan and Decadron with interruption few times based on her request for treatment breaks or recently because of her treatment with Diflucan for pulmonary cryptococcal infection. The patient is feeling much better today. She had repeat myeloma panel which showed no clear evidence for progression but the patient is very interested in resuming her treatment again because of concern about deterioration of her renal function. I made with the patient and recommended for her to start her treatment with Carfilzomib, Cytoxan and  Decadron on 04/23/2016. For the pulmonary cryptococcal infection the patient completed a course of treatment with Diflucan. I would repeat CT scan of the chest for reevaluation of her condition. For the lab sufficiency she will continue her current follow-up visit and evaluation by nephrology. For the anemia of chronic disease, we will continue to monitor closely and consider the patient for transfusion if needed. I will see the patient back for follow-up visit in 4 weeks for evaluation and management of any adverse effect of her treatment. She was advised to call immediately if she has any concerning symptoms in the interval. The patient voices understanding of current disease status and treatment options and is in agreement with the current care plan. All questions were answered. The patient knows to call the clinic with any problems, questions or concerns. We can certainly see the patient much sooner if necessary.  Disclaimer: This note was dictated with voice recognition software. Similar sounding words can inadvertently be transcribed and may not be corrected upon review.

## 2016-04-13 ENCOUNTER — Telehealth: Payer: Self-pay | Admitting: Internal Medicine

## 2016-04-13 NOTE — Telephone Encounter (Signed)
Scheduled appts per 04/12/2016 los. Patient aware of appt dates and time.

## 2016-04-17 ENCOUNTER — Ambulatory Visit (HOSPITAL_COMMUNITY)
Admission: RE | Admit: 2016-04-17 | Discharge: 2016-04-17 | Disposition: A | Discharge status: Home / Self Care | Payer: Medicare Other | Source: Ambulatory Visit | Attending: Internal Medicine | Admitting: Internal Medicine

## 2016-04-17 ENCOUNTER — Encounter (HOSPITAL_COMMUNITY): Payer: Self-pay

## 2016-04-17 DIAGNOSIS — B450 Pulmonary cryptococcosis: Secondary | ICD-10-CM | POA: Insufficient documentation

## 2016-04-17 DIAGNOSIS — R918 Other nonspecific abnormal finding of lung field: Secondary | ICD-10-CM | POA: Insufficient documentation

## 2016-04-17 DIAGNOSIS — J189 Pneumonia, unspecified organism: Secondary | ICD-10-CM | POA: Diagnosis not present

## 2016-04-17 DIAGNOSIS — C9000 Multiple myeloma not having achieved remission: Secondary | ICD-10-CM | POA: Insufficient documentation

## 2016-04-17 DIAGNOSIS — K449 Diaphragmatic hernia without obstruction or gangrene: Secondary | ICD-10-CM | POA: Insufficient documentation

## 2016-04-17 DIAGNOSIS — I700 Atherosclerosis of aorta: Secondary | ICD-10-CM | POA: Insufficient documentation

## 2016-04-17 DIAGNOSIS — J439 Emphysema, unspecified: Secondary | ICD-10-CM | POA: Diagnosis not present

## 2016-04-17 DIAGNOSIS — I2510 Atherosclerotic heart disease of native coronary artery without angina pectoris: Secondary | ICD-10-CM | POA: Diagnosis not present

## 2016-04-17 DIAGNOSIS — D3502 Benign neoplasm of left adrenal gland: Secondary | ICD-10-CM | POA: Diagnosis not present

## 2016-04-23 ENCOUNTER — Other Ambulatory Visit (HOSPITAL_BASED_OUTPATIENT_CLINIC_OR_DEPARTMENT_OTHER): Payer: Medicare Other

## 2016-04-23 ENCOUNTER — Ambulatory Visit (HOSPITAL_BASED_OUTPATIENT_CLINIC_OR_DEPARTMENT_OTHER): Payer: Medicare Other

## 2016-04-23 DIAGNOSIS — C9000 Multiple myeloma not having achieved remission: Secondary | ICD-10-CM | POA: Diagnosis not present

## 2016-04-23 DIAGNOSIS — Z5112 Encounter for antineoplastic immunotherapy: Secondary | ICD-10-CM | POA: Diagnosis not present

## 2016-04-23 LAB — CBC WITH DIFFERENTIAL/PLATELET
BASO%: 0.6 % (ref 0.0–2.0)
Basophils Absolute: 0 10*3/uL (ref 0.0–0.1)
EOS%: 2.8 % (ref 0.0–7.0)
Eosinophils Absolute: 0.2 10*3/uL (ref 0.0–0.5)
HEMATOCRIT: 31.7 % — AB (ref 34.8–46.6)
HEMOGLOBIN: 10.6 g/dL — AB (ref 11.6–15.9)
LYMPH%: 21.4 % (ref 14.0–49.7)
MCH: 35.9 pg — ABNORMAL HIGH (ref 25.1–34.0)
MCHC: 33.3 g/dL (ref 31.5–36.0)
MCV: 107.7 fL — ABNORMAL HIGH (ref 79.5–101.0)
MONO#: 0.8 10*3/uL (ref 0.1–0.9)
MONO%: 14 % (ref 0.0–14.0)
NEUT%: 61.2 % (ref 38.4–76.8)
NEUTROS ABS: 3.3 10*3/uL (ref 1.5–6.5)
PLATELETS: 109 10*3/uL — AB (ref 145–400)
RBC: 2.94 10*6/uL — ABNORMAL LOW (ref 3.70–5.45)
RDW: 14.5 % (ref 11.2–14.5)
WBC: 5.4 10*3/uL (ref 3.9–10.3)
lymph#: 1.2 10*3/uL (ref 0.9–3.3)

## 2016-04-23 LAB — COMPREHENSIVE METABOLIC PANEL
ALBUMIN: 4 g/dL (ref 3.5–5.0)
ALT: 16 U/L (ref 0–55)
ANION GAP: 11 meq/L (ref 3–11)
AST: 17 U/L (ref 5–34)
Alkaline Phosphatase: 97 U/L (ref 40–150)
BILIRUBIN TOTAL: 0.85 mg/dL (ref 0.20–1.20)
BUN: 26.1 mg/dL — AB (ref 7.0–26.0)
CALCIUM: 9.5 mg/dL (ref 8.4–10.4)
CHLORIDE: 109 meq/L (ref 98–109)
CO2: 23 mEq/L (ref 22–29)
CREATININE: 2.7 mg/dL — AB (ref 0.6–1.1)
EGFR: 17 mL/min/{1.73_m2} — ABNORMAL LOW (ref >90)
Glucose: 141 mg/dl — ABNORMAL HIGH (ref 70–140)
Potassium: 3.7 mEq/L (ref 3.5–5.1)
Sodium: 142 mEq/L (ref 136–145)
TOTAL PROTEIN: 6.2 g/dL — AB (ref 6.4–8.3)

## 2016-04-23 MED ORDER — SODIUM CHLORIDE 0.9 % IV SOLN
225.0000 mg/m2 | Freq: Once | INTRAVENOUS | Status: AC
  Administered 2016-04-23: 420 mg via INTRAVENOUS
  Filled 2016-04-23: qty 21

## 2016-04-23 MED ORDER — CARFILZOMIB CHEMO INJECTION 60 MG
27.0000 mg/m2 | Freq: Once | INTRAVENOUS | Status: AC
  Administered 2016-04-23: 50 mg via INTRAVENOUS
  Filled 2016-04-23: qty 25

## 2016-04-23 MED ORDER — SODIUM CHLORIDE 0.9 % IJ SOLN
10.0000 mL | INTRAMUSCULAR | Status: DC | PRN
  Administered 2016-04-23: 10 mL
  Filled 2016-04-23: qty 10

## 2016-04-23 MED ORDER — SODIUM CHLORIDE 0.9 % IV SOLN
Freq: Once | INTRAVENOUS | Status: AC
  Administered 2016-04-23: 15:00:00 via INTRAVENOUS

## 2016-04-23 MED ORDER — DEXAMETHASONE SODIUM PHOSPHATE 10 MG/ML IJ SOLN: 10.0000 mg | Freq: Once | INTRAMUSCULAR | Status: DC

## 2016-04-23 MED ORDER — ONDANSETRON HCL 8 MG PO TABS
ORAL_TABLET | ORAL | Status: AC
  Filled 2016-04-23: qty 1

## 2016-04-23 MED ORDER — DEXAMETHASONE SODIUM PHOSPHATE 10 MG/ML IJ SOLN
INTRAMUSCULAR | Status: AC
  Filled 2016-04-23: qty 1

## 2016-04-23 MED ORDER — HEPARIN SOD (PORK) LOCK FLUSH 100 UNIT/ML IV SOLN
500.0000 [IU] | Freq: Once | INTRAVENOUS | Status: AC | PRN
  Administered 2016-04-23: 500 [IU]
  Filled 2016-04-23: qty 5

## 2016-04-23 MED ORDER — ONDANSETRON HCL 8 MG PO TABS
8.0000 mg | ORAL_TABLET | Freq: Once | ORAL | Status: AC
  Administered 2016-04-23: 8 mg via ORAL

## 2016-04-23 NOTE — Patient Instructions (Signed)
[facility] Discharge Instructions for Patients Receiving Chemotherapy  Today you received the following chemotherapy agents:  Kyprolis and Cytoxan  To help prevent nausea and vomiting after your treatment, we encourage you to take your nausea medication as ordered per MD.   If you develop nausea and vomiting that is not controlled by your nausea medication, call the clinic.   BELOW ARE SYMPTOMS THAT SHOULD BE REPORTED IMMEDIATELY:  *FEVER GREATER THAN 100.5 F  *CHILLS WITH OR WITHOUT FEVER  NAUSEA AND VOMITING THAT IS NOT CONTROLLED WITH YOUR NAUSEA MEDICATION  *UNUSUAL SHORTNESS OF BREATH  *UNUSUAL BRUISING OR BLEEDING  TENDERNESS IN MOUTH AND THROAT WITH OR WITHOUT PRESENCE OF ULCERS  *URINARY PROBLEMS  *BOWEL PROBLEMS  UNUSUAL RASH Items with * indicate a potential emergency and should be followed up as soon as possible.  Feel free to call the clinic you have any questions or concerns. The clinic phone number is [phone].  Please show the [facility] at check-in to the Emergency Department and triage nurse.

## 2016-04-24 ENCOUNTER — Ambulatory Visit (HOSPITAL_BASED_OUTPATIENT_CLINIC_OR_DEPARTMENT_OTHER): Payer: Medicare Other | Admitting: Nurse Practitioner

## 2016-04-24 ENCOUNTER — Ambulatory Visit (HOSPITAL_BASED_OUTPATIENT_CLINIC_OR_DEPARTMENT_OTHER): Payer: Medicare Other

## 2016-04-24 ENCOUNTER — Encounter: Payer: Self-pay | Admitting: Nurse Practitioner

## 2016-04-24 DIAGNOSIS — Z853 Personal history of malignant neoplasm of breast: Secondary | ICD-10-CM

## 2016-04-24 DIAGNOSIS — R509 Fever, unspecified: Secondary | ICD-10-CM

## 2016-04-24 DIAGNOSIS — C9000 Multiple myeloma not having achieved remission: Secondary | ICD-10-CM

## 2016-04-24 DIAGNOSIS — C9001 Multiple myeloma in remission: Secondary | ICD-10-CM

## 2016-04-24 LAB — URINALYSIS, MICROSCOPIC - CHCC
BILIRUBIN (URINE): NEGATIVE
GLUCOSE UR CHCC: NEGATIVE mg/dL
KETONES: NEGATIVE mg/dL
NITRITE: NEGATIVE
Protein: <30 mg/dL
Specific Gravity, Urine: 1.01 (ref 1.003–1.035)
Urobilinogen, UR: 0.2 mg/dL (ref 0.2–1)
pH: 6 (ref 4.6–8.0)

## 2016-04-24 MED ORDER — AMOXICILLIN-POT CLAVULANATE 500-125 MG PO TABS: 1.0000 | ORAL_TABLET | Freq: Two times a day (BID) | ORAL | 0 refills | Status: DC

## 2016-04-24 MED ORDER — HEPARIN SOD (PORK) LOCK FLUSH 100 UNIT/ML IV SOLN
500.0000 [IU] | Freq: Once | INTRAVENOUS | Status: AC | PRN
  Administered 2016-04-24: 500 [IU]
  Filled 2016-04-24: qty 5

## 2016-04-24 MED ORDER — SODIUM CHLORIDE 0.9 % IJ SOLN
10.0000 mL | INTRAMUSCULAR | Status: DC | PRN
  Administered 2016-04-24: 10 mL
  Filled 2016-04-24: qty 10

## 2016-04-24 NOTE — Progress Notes (Signed)
SYMPTOM MANAGEMENT CLINIC    Chief Complaint: Fever  HPI:  Nancy Norris 76 y.o. female diagnosed with multiple myeloma.  Currently undergoing Cytoxan/kyprolis chemotherapy regimen.     No history exists.    Review of Systems  Constitutional: Positive for chills, fever and malaise/fatigue.  Gastrointestinal: Positive for nausea and vomiting.  All other systems reviewed and are negative.   Past Medical History:  Diagnosis Date  . Anemia   . Anginal pain ([place])    used NTG x 2 May 31 and 06/15/13   . Anxiety   . Arthritis   . B12 deficiency 12/04/2014  . Breast cancer ([place])   . CKD (chronic kidney disease) stage 3, GFR 30-59 ml/min   . Complication of anesthesia   . COPD (chronic obstructive pulmonary disease) ([place])   . Cryptococcal pneumonitis ([place]) 11/22/2015  . Depression   . Dizziness   . Dyspnea   . Fibromyalgia   . Fibromyalgia   . GERD (gastroesophageal reflux disease)   . Headache(784.0)   . Heart murmur   . Hemoptysis 10/21/2015  . History of blood transfusion    last one May 12   . Hx of cardiovascular stress test    LexiScan with low level exercise Myoview (02/2013): No ischemia, EF 72%; normal study  . Hx of echocardiogram    a.  Echocardiogram (12/26/2012): EF 24-58%, grade 1 diastolic dysfunction;   b.  Echocardiogram (02/2013): EF 55-60%, no WMA, trivial effusion  . Hyperkalemia   . Hyponatremia   . Hypothyroidism   . Mucositis   . Multiple myeloma   . Myocardial infarction    in past, patient was unaware.   . Neuropathy ([place])   . Pain in joint, pelvic region and thigh 07/07/2015  . PONV (postoperative nausea and vomiting) 2008   after mastestomy    Past Surgical History:  Procedure Laterality Date  . ABDOMINAL HYSTERECTOMY  1981  . AV FISTULA PLACEMENT Left 06/19/2013   Procedure: CREATION OF LEFT ARM ARTERIOVENOUS (AV) FISTULA ;  Surgeon: Angelia Mould, MD;  Location: [facility];  Service: Vascular;  Laterality: Left;  . BREAST  RECONSTRUCTION    . BREAST SURGERY    . CATARACT EXTRACTION, BILATERAL    . CHOLECYSTECTOMY  1971  . EYE SURGERY Bilateral    lens implant  . history of Port removal    . LUNG BIOPSY  10/21/2015  . MASTECTOMY Left 2008  . PORTACATH PLACEMENT  12/2012   has had 2  . Status post stem cell transplant on September 28, 2008.      has Hypercalcemia; HX: breast cancer; Asthma, chronic; Hypothyroid; Chronic diastolic heart failure (HCC); HTN (hypertension); GERD (gastroesophageal reflux disease); COPD (chronic obstructive pulmonary disease) with emphysema ([place]); Fibromyalgia; Mixed hyperlipidemia; Prediabetes; Vitamin D deficiency; Medication management; Depression, controlled; Multiple myeloma ([place]); Orthostatic hypotension; CKD (chronic kidney disease) stage 5, GFR less than 15 ml/min (HCC); Encounter for antineoplastic chemotherapy; CHF (congestive heart failure) ([place]); B12 deficiency; Thrombocytopenia ([place]); Anemia in neoplastic disease; Multiple myeloma not having achieved remission ([place]); Fever; Pneumonia; Nodule of left lung; Lung mass; Cryptococcal pneumonitis (HCC); and Vaginal yeast infection on her problem list.    is allergic to codeine; latex; other; onion; zyprexa [olanzapine]; adhesive [tape]; hydrocodone; iodinated diagnostic agents; and sulfa antibiotics.  Allergies as of 04/24/2016      Reactions   Codeine Anaphylaxis   Latex Shortness Of Breath   Adhesive products    Other Other (See Comments)   Onion, chocolate  causes migraines   Onion Other (See Comments)   Causes migraine headaches   Zyprexa [olanzapine] Other (See Comments)   Confusion , dizzy,unsteady   Adhesive [tape] Other (See Comments)   blisters   Hydrocodone Rash   With extreme itching   Iodinated Diagnostic Agents Itching, Rash, Hives   Happened 60 years ago   Sulfa Antibiotics Itching      Medication List       Accurate as of 04/24/16  5:54 PM. Always use your most recent med list.            acetaminophen 325 MG tablet Commonly known as:  TYLENOL Take 650 mg by mouth every 6 (six) hours as needed for mild pain or moderate pain.   albuterol 108 (90 Base) MCG/ACT inhaler Commonly known as:  PROVENTIL HFA;VENTOLIN HFA Inhale 1-2 puffs into the lungs every 6 (six) hours as needed for wheezing or shortness of breath (cough).   amoxicillin-clavulanate 500-125 MG tablet Commonly known as:  AUGMENTIN Take 1 tablet (500 mg total) by mouth 2 (two) times daily.   azelastine 0.1 % nasal spray Commonly known as:  ASTELIN Place 2 sprays into both nostrils 2 (two) times daily. Use in each nostril as directed   buPROPion 300 MG 24 hr tablet Commonly known as:  WELLBUTRIN XL TAKE 1 TABLET BY MOUTH EACH MORNING FOR MOOD AND DEPRESSION.   butalbital-acetaminophen-caffeine 50-325-40 MG tablet Commonly known as:  FIORICET, ESGIC TAKE 1 OR 2 TABLETS BY MOUTH EVERY 4 HOURS AS NEEDED FOR PAIN OR HEADACHE.   cetirizine 10 MG tablet Commonly known as:  ZYRTEC Take 10 mg by mouth daily.   Cholecalciferol 4000 units Tabs Take 4,000 Units by mouth daily.   citalopram 40 MG tablet Commonly known as:  CELEXA TAKE 1 TABLET BY MOUTH DAILY OR AS DIRECTED   cromolyn 4 % ophthalmic solution Commonly known as:  OPTICROM Place 1 drop into both eyes 4 (four) times daily.   ferrous sulfate 325 (65 FE) MG tablet Take 1 tablet daily or as directed   fluconazole 200 MG tablet Commonly known as:  DIFLUCAN Take 1 tablet (200 mg total) by mouth daily.   fluticasone 50 MCG/ACT nasal spray Commonly known as:  FLONASE Place 2 sprays into both nostrils daily.   fluticasone furoate-vilanterol 100-25 MCG/INH Aepb Commonly known as:  BREO ELLIPTA Inhale 1 puff into the lungs daily.   gabapentin 300 MG capsule Commonly known as:  NEURONTIN TAKE 1 CAPSULE BY MOUTH AT BEDTIME.   isosorbide mononitrate 30 MG 24 hr tablet Commonly known as:  IMDUR Take 1 tablet (30 mg total) by mouth daily.    levothyroxine 150 MCG tablet Commonly known as:  SYNTHROID, LEVOTHROID Take 1 tablet (150 mcg total) by mouth daily.   loperamide 2 MG capsule Commonly known as:  IMODIUM Take 1 capsule (2 mg total) by mouth as needed for diarrhea or loose stools.   loratadine 10 MG tablet Commonly known as:  CLARITIN Take 10 mg by mouth daily.   LORazepam 0.5 MG tablet Commonly known as:  ATIVAN Take 1 tablet (0.5 mg total) by mouth every 8 (eight) hours as needed (nausea).   loteprednol 0.2 % Susp Commonly known as:  ALREX Place 1 drop into both eyes 4 (four) times daily.   magic mouthwash Soln Take 5 mLs by mouth 4 (four) times daily as needed for mouth pain.   meloxicam 15 MG tablet Commonly known as:  MOBIC Take 1 tablet (15 mg total)  by mouth daily.   midodrine 10 MG tablet Commonly known as:  PROAMATINE TAKE 1 TABLET BY MOUTH 3 TIMES DAILY.   montelukast 10 MG tablet Commonly known as:  SINGULAIR TAKE 1 TABLET BY MOUTH DAILY.   nitroGLYCERIN 0.4 MG SL tablet Commonly known as:  NITROSTAT Place 0.4 mg under the tongue every 5 (five) minutes as needed for chest pain. Reported on 03/30/2015   nystatin powder Commonly known as:  MYCOSTATIN/NYSTOP Apply topically 2 (two) times daily.   OVER THE COUNTER MEDICATION Take 1 tablet by mouth daily as needed.   pantoprazole 40 MG tablet Commonly known as:  PROTONIX TAKE 1 TABLET DAILY FOR ACID REFLUX & INDIGESTION   PRESCRIPTION MEDICATION Pt gets chemo at [facility]   promethazine 25 MG tablet Commonly known as:  PHENERGAN Take 25 mg by mouth.   ranitidine 300 MG tablet Commonly known as:  ZANTAC Take 1 to 2 tablets daily for heartburn & reflux to allow wean and transition from PPI / pantoprazole   senna 8.6 MG tablet Commonly known as:  SENOKOT Take 1 tablet by mouth at bedtime.   terconazole 0.4 % vaginal cream Commonly known as:  TERAZOL 7 Place 1 applicator vaginally at bedtime.   traMADol 50 MG tablet Commonly known  as:  ULTRAM Take 1 to 2 tablets by mouth 4 times a day as needed for pain.   vitamin C 250 MG tablet Commonly known as:  ASCORBIC ACID Take 250 mg by mouth daily.        PHYSICAL EXAMINATION  Oncology Vitals 04/23/2016 04/12/2016  Height - -  Weight - 68.811 kg  Weight (lbs) - 151 lbs 11 oz  BMI (kg/m2) - 25.24 kg/m2  Temp 98.2 98.5  Pulse 63 113  Resp 20 18  Resp (Historical as of 08/16/11) - -  SpO2 100 96  BSA (m2) - 1.78 m2   BP Readings from Last 2 Encounters:  04/23/16 (!) 111/54  04/12/16 (!) 123/51    Physical Exam  Constitutional: She is oriented to person, place, and time. She appears unhealthy.  HENT:  Head: Normocephalic and atraumatic.  Mouth/Throat: Oropharynx is clear and moist.  Eyes: Conjunctivae and EOM are normal. Pupils are equal, round, and reactive to light. Right eye exhibits no discharge. Left eye exhibits no discharge. No scleral icterus.  Neck: Normal range of motion. Neck supple. No JVD present. No tracheal deviation present. No thyromegaly present.  Cardiovascular: Normal rate, regular rhythm, normal heart sounds and intact distal pulses.   Pulmonary/Chest: Effort normal and breath sounds normal. No respiratory distress. She has no wheezes. She has no rales. She exhibits no tenderness.  Abdominal: Soft. Bowel sounds are normal. She exhibits no distension and no mass. There is no tenderness. There is no rebound and no guarding.  Musculoskeletal: Normal range of motion. She exhibits no edema or tenderness.  Lymphadenopathy:    She has no cervical adenopathy.  Neurological: She is alert and oriented to person, place, and time. Gait normal.  Skin: Skin is warm and dry. No rash noted. No erythema. No pallor.  Psychiatric: Affect normal.  Nursing note and vitals reviewed.   LABORATORY DATA:. Appointment on 04/24/2016  Component Date Value Ref Range Status  . Glucose 04/24/2016 Negative  Negative mg/dL Final  . Bilirubin (Urine) 04/24/2016 Negative   Negative Final  . Ketones 04/24/2016 Negative  Negative mg/dL Final  . Specific Gravity, Urine 04/24/2016 1.010  1.003 - 1.035 Final  . Blood 04/24/2016 Trace  Negative  Final  . pH 04/24/2016 6.0  4.6 - 8.0 Final  . Protein 04/24/2016 < 30  Negative- <30 mg/dL Final  . Urobilinogen, UR 04/24/2016 0.2  0.2 - 1 mg/dL Final  . Nitrite 04/24/2016 Negative  Negative Final  . Leukocyte Esterase 04/24/2016 Trace  Negative Final  . RBC / HPF 04/24/2016 0-2  0 - 2 Final  . WBC, UA 04/24/2016 0-2  0-2;Negative Final  . Bacteria, UA 04/24/2016 Moderate  Negative- Trace Final  . Casts 04/24/2016 Hyaline  Negative Final  . Epithelial Cells 04/24/2016 Occasional  Negative- Few Final  Appointment on 04/23/2016  Component Date Value Ref Range Status  . WBC 04/23/2016 5.4  3.9 - 10.3 10e3/uL Final  . NEUT# 04/23/2016 3.3  1.5 - 6.5 10e3/uL Final  . HGB 04/23/2016 10.6* 11.6 - 15.9 g/dL Final  . HCT 04/23/2016 31.7* 34.8 - 46.6 % Final  . Platelets 04/23/2016 109* 145 - 400 10e3/uL Final  . MCV 04/23/2016 107.7* 79.5 - 101.0 fL Final  . MCH 04/23/2016 35.9* 25.1 - 34.0 pg Final  . MCHC 04/23/2016 33.3  31.5 - 36.0 g/dL Final  . RBC 04/23/2016 2.94* 3.70 - 5.45 10e6/uL Final  . RDW 04/23/2016 14.5  11.2 - 14.5 % Final  . lymph# 04/23/2016 1.2  0.9 - 3.3 10e3/uL Final  . MONO# 04/23/2016 0.8  0.1 - 0.9 10e3/uL Final  . Eosinophils Absolute 04/23/2016 0.2  0.0 - 0.5 10e3/uL Final  . Basophils Absolute 04/23/2016 0.0  0.0 - 0.1 10e3/uL Final  . NEUT% 04/23/2016 61.2  38.4 - 76.8 % Final  . LYMPH% 04/23/2016 21.4  14.0 - 49.7 % Final  . MONO% 04/23/2016 14.0  0.0 - 14.0 % Final  . EOS% 04/23/2016 2.8  0.0 - 7.0 % Final  . BASO% 04/23/2016 0.6  0.0 - 2.0 % Final  . Sodium 04/23/2016 142  136 - 145 mEq/L Final  . Potassium 04/23/2016 3.7  3.5 - 5.1 mEq/L Final  . Chloride 04/23/2016 109  98 - 109 mEq/L Final  . CO2 04/23/2016 23  22 - 29 mEq/L Final  . Glucose 04/23/2016 141* 70 - 140 mg/dl Final  .  BUN 04/23/2016 26.1* 7.0 - 26.0 mg/dL Final  . Creatinine 04/23/2016 2.7* 0.6 - 1.1 mg/dL Final  . Total Bilirubin 04/23/2016 0.85  0.20 - 1.20 mg/dL Final  . Alkaline Phosphatase 04/23/2016 97  40 - 150 U/L Final  . AST 04/23/2016 17  5 - 34 U/L Final  . ALT 04/23/2016 16  0 - 55 U/L Final  . Total Protein 04/23/2016 6.2* 6.4 - 8.3 g/dL Final  . Albumin 04/23/2016 4.0  3.5 - 5.0 g/dL Final  . Calcium 04/23/2016 9.5  8.4 - 10.4 mg/dL Final  . Anion Gap 04/23/2016 11  3 - 11 mEq/L Final  . EGFR 04/23/2016 17* >90 ml/min/1.73 m2 Final    RADIOGRAPHIC STUDIES: No results found.  ASSESSMENT/PLAN:    Multiple myeloma (HCC) Patient received cycle 12, day 1 of her Cytoxan/kyprolis chemotherapy therapy regimen yesterday; and presented back to the [facility] today to receive cycle 12, day 2 of her regimen.  However, patient was noted to have a fever to maximum 100.9 while at the cancer center.  Reviewed all findings with Dr.Kale; and he advised holding patient's chemotherapy today.  See further notes for details of today's visit..  Patient is scheduled to return for labs in chemotherapy therapy On 04/30/2016.  She Scheduled to Return for Labs, Visit, and Her Next  Cycle of Chemotherapy on 05/07/2016.  Fever Patient received cycle 12, day 1 of her Cytoxan/kyprolis chemotherapy therapy regimen yesterday; and presented back to the [facility] today to receive cycle 12, day 2 of her regimen.  However, patient was noted to have a fever to maximum 100.9 while at the cancer center.  She was complaining of increased fatigue and states that she vomited one time last night.  She denies any URI symptoms.  She denies any cough.  She denies any UTI symptoms.  On exam today.  Lungs are clear bilaterally.  She doesn't appear toxic.  Of note-patient has chronic renal insufficiency.  Creatinine has actually improved today and is down to 2.7.  Calculated creatinine clearance per this provider is 19.85.  However, the  Epic chart computes the creatinine clearance to 17.8.  Also, obtained a urinalysis which appeared negative for UTI.  Awaiting both the urine culture results and blood cultures 2 results.  Was initially planning to prescrib the patient Levaquin antibiotics; but will instead switch to Augmentin 500/125 mg tablets-with the patient taken 1 tablet twice daily per renal dosing.  Patient was advised to call/return or go directly to the emergency for any worsening symptoms whatsoever.     Patient stated understanding of all instructions; and was in agreement with this plan of care. The patient knows to call the clinic with any problems, questions or concerns.   Total time spent with patient was 25 minutes;  with greater than 76 percent of that time spent in face to face counseling regarding patient's symptoms,  and coordination of care and follow up.  Disclaimer:This dictation was prepared with Dragon/digital dictation along with [employer]. Any transcriptional errors that result from this process are unintentional.  Drue Second, NP 04/24/2016

## 2016-04-24 NOTE — Progress Notes (Signed)
Patient complains of fatigue, chills, aches and pains, nausea, and some vomitting. Her temp is 100.9 F. [name], NP assessed the patient at chairside and will enter orders for her work-up.  Ma Hillock, RN

## 2016-04-24 NOTE — Assessment & Plan Note (Signed)
Patient received cycle 12, day 1 of her Cytoxan/kyprolis chemotherapy therapy regimen yesterday; and presented back to the [facility] today to receive cycle 12, day 2 of her regimen.  However, patient was noted to have a fever to maximum 100.9 while at the cancer center.  Reviewed all findings with Dr.Kale; and he advised holding patient's chemotherapy today.  See further notes for details of today's visit..  Patient is scheduled to return for labs in chemotherapy therapy On 04/30/2016.  She Scheduled to Return for Labs, Visit, and Her Next Cycle of Chemotherapy on 05/07/2016.

## 2016-04-24 NOTE — Assessment & Plan Note (Signed)
Patient received cycle 12, day 1 of her Cytoxan/kyprolis chemotherapy therapy regimen yesterday; and presented back to the [facility] today to receive cycle 12, day 2 of her regimen.  However, patient was noted to have a fever to maximum 100.9 while at the cancer center.  She was complaining of increased fatigue and states that she vomited one time last night.  She denies any URI symptoms.  She denies any cough.  She denies any UTI symptoms.  On exam today.  Lungs are clear bilaterally.  She doesn't appear toxic.  Of note-patient has chronic renal insufficiency.  Creatinine has actually improved today and is down to 2.7.  Calculated creatinine clearance per this provider is 19.85.  However, the Epic chart computes the creatinine clearance to 17.8.  Also, obtained a urinalysis which appeared negative for UTI.  Awaiting both the urine culture results and blood cultures 2 results.  Was initially planning to prescrib the patient Levaquin antibiotics; but will instead switch to Augmentin 500/125 mg tablets-with the patient taken 1 tablet twice daily per renal dosing.  Patient was advised to call/return or go directly to the emergency for any worsening symptoms whatsoever.

## 2016-04-25 ENCOUNTER — Telehealth: Payer: Self-pay | Admitting: Nurse Practitioner

## 2016-04-25 LAB — URINE CULTURE

## 2016-04-25 NOTE — Telephone Encounter (Signed)
Called patient to check in with her.  Patient's home number is no longer in service in patient's cell phone number did not answer.  Also, there was no voicemail on patient's cell phone to leave a message.  Will try to call the patient back later today to check on her once again.  Had originally discussed prescribing Levaquin for the patient yesterday afternoon; but then switched the antibiotic to Augmentin at a renal dose due to patient's chronic issues with renal insufficiency.

## 2016-04-27 DIAGNOSIS — Z1231 Encounter for screening mammogram for malignant neoplasm of breast: Secondary | ICD-10-CM | POA: Diagnosis not present

## 2016-04-27 DIAGNOSIS — M8589 Other specified disorders of bone density and structure, multiple sites: Secondary | ICD-10-CM | POA: Diagnosis not present

## 2016-04-27 DIAGNOSIS — Z853 Personal history of malignant neoplasm of breast: Secondary | ICD-10-CM | POA: Diagnosis not present

## 2016-04-30 ENCOUNTER — Other Ambulatory Visit: Payer: Medicare Other

## 2016-04-30 ENCOUNTER — Ambulatory Visit (HOSPITAL_BASED_OUTPATIENT_CLINIC_OR_DEPARTMENT_OTHER): Payer: Medicare Other

## 2016-04-30 ENCOUNTER — Other Ambulatory Visit (HOSPITAL_BASED_OUTPATIENT_CLINIC_OR_DEPARTMENT_OTHER): Payer: Medicare Other

## 2016-04-30 VITALS — BP 135/57 | HR 64 | Temp 98.1°F | Resp 18

## 2016-04-30 DIAGNOSIS — Z5112 Encounter for antineoplastic immunotherapy: Secondary | ICD-10-CM | POA: Diagnosis not present

## 2016-04-30 DIAGNOSIS — C9000 Multiple myeloma not having achieved remission: Secondary | ICD-10-CM

## 2016-04-30 DIAGNOSIS — Z853 Personal history of malignant neoplasm of breast: Secondary | ICD-10-CM

## 2016-04-30 DIAGNOSIS — C9001 Multiple myeloma in remission: Secondary | ICD-10-CM

## 2016-04-30 LAB — COMPREHENSIVE METABOLIC PANEL
ALBUMIN: 3.7 g/dL (ref 3.5–5.0)
ALK PHOS: 100 U/L (ref 40–150)
ALT: 19 U/L (ref 0–55)
ANION GAP: 10 meq/L (ref 3–11)
AST: 13 U/L (ref 5–34)
BILIRUBIN TOTAL: 0.55 mg/dL (ref 0.20–1.20)
BUN: 33.2 mg/dL — ABNORMAL HIGH (ref 7.0–26.0)
CO2: 25 meq/L (ref 22–29)
CREATININE: 3 mg/dL — AB (ref 0.6–1.1)
Calcium: 9.9 mg/dL (ref 8.4–10.4)
Chloride: 108 mEq/L (ref 98–109)
EGFR: 14 mL/min/{1.73_m2} — AB (ref >90)
Glucose: 100 mg/dl (ref 70–140)
Potassium: 4.2 mEq/L (ref 3.5–5.1)
Sodium: 142 mEq/L (ref 136–145)
Total Protein: 6 g/dL — ABNORMAL LOW (ref 6.4–8.3)

## 2016-04-30 LAB — CBC WITH DIFFERENTIAL/PLATELET
BASO%: 0.5 % (ref 0.0–2.0)
Basophils Absolute: 0 10*3/uL (ref 0.0–0.1)
EOS ABS: 0.2 10*3/uL (ref 0.0–0.5)
EOS%: 3.4 % (ref 0.0–7.0)
HEMATOCRIT: 30 % — AB (ref 34.8–46.6)
HEMOGLOBIN: 10 g/dL — AB (ref 11.6–15.9)
LYMPH#: 1.3 10*3/uL (ref 0.9–3.3)
LYMPH%: 20.7 % (ref 14.0–49.7)
MCH: 35.4 pg — ABNORMAL HIGH (ref 25.1–34.0)
MCHC: 33.2 g/dL (ref 31.5–36.0)
MCV: 106.6 fL — AB (ref 79.5–101.0)
MONO#: 1 10*3/uL — AB (ref 0.1–0.9)
MONO%: 14.8 % — ABNORMAL HIGH (ref 0.0–14.0)
NEUT%: 60.6 % (ref 38.4–76.8)
NEUTROS ABS: 3.9 10*3/uL (ref 1.5–6.5)
Platelets: 114 10*3/uL — ABNORMAL LOW (ref 145–400)
RBC: 2.81 10*6/uL — ABNORMAL LOW (ref 3.70–5.45)
RDW: 14.1 % (ref 11.2–14.5)
WBC: 6.4 10*3/uL (ref 3.9–10.3)

## 2016-04-30 LAB — CULTURE, BLOOD (SINGLE)

## 2016-04-30 MED ORDER — HEPARIN SOD (PORK) LOCK FLUSH 100 UNIT/ML IV SOLN
500.0000 [IU] | Freq: Once | INTRAVENOUS | Status: AC | PRN
Start: 2016-04-30 — End: 2016-04-30
  Administered 2016-04-30: 500 [IU]
  Filled 2016-04-30: qty 5

## 2016-04-30 MED ORDER — SODIUM CHLORIDE 0.9 % IV SOLN
225.0000 mg/m2 | Freq: Once | INTRAVENOUS | Status: AC
  Administered 2016-04-30: 420 mg via INTRAVENOUS
  Filled 2016-04-30: qty 21

## 2016-04-30 MED ORDER — ONDANSETRON HCL 8 MG PO TABS
8.0000 mg | ORAL_TABLET | Freq: Once | ORAL | Status: AC
  Administered 2016-04-30: 8 mg via ORAL

## 2016-04-30 MED ORDER — ONDANSETRON HCL 8 MG PO TABS
ORAL_TABLET | ORAL | Status: AC
  Filled 2016-04-30: qty 1

## 2016-04-30 MED ORDER — SODIUM CHLORIDE 0.9 % IJ SOLN
10.0000 mL | INTRAMUSCULAR | Status: DC | PRN
  Administered 2016-04-30: 10 mL
  Filled 2016-04-30: qty 10

## 2016-04-30 MED ORDER — ZOLEDRONIC ACID 4 MG/5ML IV CONC
3.0000 mg | Freq: Once | INTRAVENOUS | Status: AC
  Administered 2016-04-30: 3 mg via INTRAVENOUS
  Filled 2016-04-30: qty 3.75

## 2016-04-30 MED ORDER — DEXTROSE 5 % IV SOLN
27.0000 mg/m2 | Freq: Once | INTRAVENOUS | Status: AC
  Administered 2016-04-30: 50 mg via INTRAVENOUS
  Filled 2016-04-30: qty 25

## 2016-04-30 MED ORDER — DEXAMETHASONE SODIUM PHOSPHATE 10 MG/ML IJ SOLN
INTRAMUSCULAR | Status: AC
  Filled 2016-04-30: qty 1

## 2016-04-30 MED ORDER — DEXAMETHASONE SODIUM PHOSPHATE 10 MG/ML IJ SOLN
10.0000 mg | Freq: Once | INTRAMUSCULAR | Status: AC
  Administered 2016-04-30: 10 mg via INTRAVENOUS

## 2016-04-30 MED ORDER — SODIUM CHLORIDE 0.9 % IV SOLN
Freq: Once | INTRAVENOUS | Status: AC
  Administered 2016-04-30: 15:00:00 via INTRAVENOUS

## 2016-04-30 NOTE — Patient Instructions (Signed)
[facility] Discharge Instructions for Patients Receiving Chemotherapy  Today you received the following chemotherapy agents:  Kyprolis and Cytoxan  To help prevent nausea and vomiting after your treatment, we encourage you to take your nausea medication as ordered per MD.   If you develop nausea and vomiting that is not controlled by your nausea medication, call the clinic.   BELOW ARE SYMPTOMS THAT SHOULD BE REPORTED IMMEDIATELY:  *FEVER GREATER THAN 100.5 F  *CHILLS WITH OR WITHOUT FEVER  NAUSEA AND VOMITING THAT IS NOT CONTROLLED WITH YOUR NAUSEA MEDICATION  *UNUSUAL SHORTNESS OF BREATH  *UNUSUAL BRUISING OR BLEEDING  TENDERNESS IN MOUTH AND THROAT WITH OR WITHOUT PRESENCE OF ULCERS  *URINARY PROBLEMS  *BOWEL PROBLEMS  UNUSUAL RASH Items with * indicate a potential emergency and should be followed up as soon as possible.  Feel free to call the clinic you have any questions or concerns. The clinic phone number is [phone].  Please show the [facility] at check-in to the Emergency Department and triage nurse.

## 2016-04-30 NOTE — Progress Notes (Signed)
Per Dr. Julien Nordmann okay to tx today despite ctn 3.0. Also okay to restart zometa today.

## 2016-05-01 ENCOUNTER — Ambulatory Visit (INDEPENDENT_AMBULATORY_CARE_PROVIDER_SITE_OTHER): Payer: Medicare Other | Admitting: Internal Medicine

## 2016-05-01 ENCOUNTER — Ambulatory Visit (HOSPITAL_BASED_OUTPATIENT_CLINIC_OR_DEPARTMENT_OTHER): Payer: Medicare Other

## 2016-05-01 ENCOUNTER — Encounter: Payer: Self-pay | Admitting: Internal Medicine

## 2016-05-01 VITALS — BP 128/65 | HR 78 | Temp 98.1°F | Ht 65.5 in | Wt 160.0 lb

## 2016-05-01 DIAGNOSIS — Z5111 Encounter for antineoplastic chemotherapy: Secondary | ICD-10-CM | POA: Diagnosis not present

## 2016-05-01 DIAGNOSIS — B450 Pulmonary cryptococcosis: Secondary | ICD-10-CM

## 2016-05-01 DIAGNOSIS — J438 Other emphysema: Secondary | ICD-10-CM

## 2016-05-01 DIAGNOSIS — C9000 Multiple myeloma not having achieved remission: Secondary | ICD-10-CM | POA: Diagnosis not present

## 2016-05-01 MED ORDER — SODIUM CHLORIDE 0.9 % IJ SOLN
10.0000 mL | INTRAMUSCULAR | Status: DC | PRN
  Administered 2016-05-01: 10 mL
  Filled 2016-05-01: qty 10

## 2016-05-01 MED ORDER — HEPARIN SOD (PORK) LOCK FLUSH 100 UNIT/ML IV SOLN
500.0000 [IU] | Freq: Once | INTRAVENOUS | Status: AC | PRN
Start: 2016-05-01 — End: 2016-05-01
  Administered 2016-05-01: 500 [IU]
  Filled 2016-05-01: qty 5

## 2016-05-01 MED ORDER — DEXAMETHASONE SODIUM PHOSPHATE 10 MG/ML IJ SOLN
10.0000 mg | Freq: Once | INTRAMUSCULAR | Status: AC
  Administered 2016-05-01: 10 mg via INTRAVENOUS

## 2016-05-01 MED ORDER — FLUCONAZOLE 200 MG PO TABS: 200.0000 mg | ORAL_TABLET | Freq: Every day | ORAL | 5 refills | Status: DC

## 2016-05-01 MED ORDER — DEXAMETHASONE SODIUM PHOSPHATE 10 MG/ML IJ SOLN
INTRAMUSCULAR | Status: AC
  Filled 2016-05-01: qty 1

## 2016-05-01 MED ORDER — SODIUM CHLORIDE 0.9 % IV SOLN
Freq: Once | INTRAVENOUS | Status: AC
  Administered 2016-05-01: 14:00:00 via INTRAVENOUS

## 2016-05-01 MED ORDER — ONDANSETRON HCL 8 MG PO TABS
ORAL_TABLET | ORAL | Status: AC
  Filled 2016-05-01: qty 1

## 2016-05-01 MED ORDER — DEXTROSE 5 % IV SOLN
27.0000 mg/m2 | Freq: Once | INTRAVENOUS | Status: AC
  Administered 2016-05-01: 50 mg via INTRAVENOUS
  Filled 2016-05-01: qty 25

## 2016-05-01 MED ORDER — ONDANSETRON HCL 8 MG PO TABS
8.0000 mg | ORAL_TABLET | Freq: Once | ORAL | Status: AC
  Administered 2016-05-01: 8 mg via ORAL

## 2016-05-01 NOTE — Assessment & Plan Note (Signed)
No back on chemotherapy.  As above, continuing on fluconazole

## 2016-05-01 NOTE — Progress Notes (Signed)
   Subjective:    Patient ID: LAFREDA CASEBEER, female    DOB: [birth year], 76 y.o.   MRN: [id number]  HPI Here for follow up of cryptococcal pneumonia.  I saw her as a new patient on 11/27 and she was already on 200 mg fluconazole (renally dosed) for over 1 month.  She was getting chemotherapy for multiple myeloma but has since seen Dr. Julien Nordmann who does not feel she needs chemo at that time.  She had a repeat CT scan of her chest and the area of concern has decreased from 3.8x3.0 to 2.1x1.7 and again prior to this visit down to 1.5 x 1.8.   CT chest independently reviewed and confirmed area decrease again. No worsening sob, no cough or productive sputum.     Review of Systems  Constitutional: Negative for chills, fatigue and fever.  Respiratory: Negative for cough and shortness of breath.   Gastrointestinal: Negative for diarrhea.  Skin: Negative for rash.       Objective:   Physical Exam  Constitutional: She appears well-developed and well-nourished.  Eyes: No scleral icterus.  Cardiovascular: Normal rate, regular rhythm and normal heart sounds.   No murmur heard. Pulmonary/Chest: Effort normal and breath sounds normal. She has no wheezes.  Skin: No rash noted.    SH: no tobacco      Assessment & Plan:

## 2016-05-01 NOTE — Patient Instructions (Signed)
[facility] Discharge Instructions for Patients Receiving Chemotherapy  Today you received the following chemotherapy agents:  Kyprolis   To help prevent nausea and vomiting after your treatment, we encourage you to take your nausea medication as ordered per MD.   If you develop nausea and vomiting that is not controlled by your nausea medication, call the clinic.   BELOW ARE SYMPTOMS THAT SHOULD BE REPORTED IMMEDIATELY:  *FEVER GREATER THAN 100.5 F  *CHILLS WITH OR WITHOUT FEVER  NAUSEA AND VOMITING THAT IS NOT CONTROLLED WITH YOUR NAUSEA MEDICATION  *UNUSUAL SHORTNESS OF BREATH  *UNUSUAL BRUISING OR BLEEDING  TENDERNESS IN MOUTH AND THROAT WITH OR WITHOUT PRESENCE OF ULCERS  *URINARY PROBLEMS  *BOWEL PROBLEMS  UNUSUAL RASH Items with * indicate a potential emergency and should be followed up as soon as possible.  Feel free to call the clinic you have any questions or concerns. The clinic phone number is [phone].  Please show the [facility] at check-in to the Emergency Department and triage nurse.

## 2016-05-01 NOTE — Assessment & Plan Note (Addendum)
Improved but still present.  Since she is back on therapy for MM, I will have her continue fluconazole while on it as partial continuation treatment and secondary prophylaxis.  Getting a CT in June and will follow up after

## 2016-05-01 NOTE — Assessment & Plan Note (Signed)
Some sob related to this but stable.

## 2016-05-04 ENCOUNTER — Encounter: Payer: Self-pay | Admitting: Cardiovascular Disease

## 2016-05-04 ENCOUNTER — Ambulatory Visit (INDEPENDENT_AMBULATORY_CARE_PROVIDER_SITE_OTHER): Payer: Medicare Other | Admitting: Cardiovascular Disease

## 2016-05-04 VITALS — BP 130/62 | HR 61 | Ht 65.0 in | Wt 157.8 lb

## 2016-05-04 DIAGNOSIS — I5032 Chronic diastolic (congestive) heart failure: Secondary | ICD-10-CM | POA: Diagnosis not present

## 2016-05-04 DIAGNOSIS — R0602 Shortness of breath: Secondary | ICD-10-CM

## 2016-05-04 NOTE — Progress Notes (Signed)
Patient ID: Nancy Norris, female   DOB: December 07, 1940, 76 y.o.   MRN: [id number]  [place], [place],   [address] Phone: [phone] Fax:  [phone]  Date:  05/04/2016   ID:  Nancy Norris, DOB Aug 15, 1940, MRN [id number]  PCP:  [name], MD  Cardiologist:  Dr. Jenkins Rouge  Nephrologist:  Dr. Marval Regal    History of Present Illness: Nancy Norris is a 76 y.o. female with a hx of recurrent multiple myeloma (s/p stem cell transplant in 2010), COPD, hypothyroidism, ductal carcinoma in situ status post mastectomy, CKD, HTN, GERD. She was first seen by me in 2012  for surgical clearance. She had an abnormal ECG.  An echocardiogram demonstrated normal LV function and she was cleared for surgery. F/U Echocardiogram (12/26/2012): EF 62-37%, grade 1 diastolic dysfunction.  She was  admitted 12/30-01/15/14  with abrupt onset of dyspnea (while coming back from chemotherapy).  She was profoundly anemic with a hemoglobin of 6.8. She was transfused with packed red blood cells. She had worsening renal function. Chest CT angiogram was not performed. VQ scan was negative for pulmonary embolism.  LE venous Dopplers were negative for DVT bilaterally.  There was some concern for volume overload and she was treated with IV Lasix x1.   Chest pains resolved  Normal myovue 2/15   Significant postural sympotms Off Florinef now due to fluid retention  Echo 04/09/14 reviewed  Impressions:  - Normal LV size and systolic function, EF 62-83%. Moderate diastolic dysfunction. Pseudonormal  Normal RV size and systolic function. Mild mitral regurgitation.  Rx for Cryptococcal Pneumonia since last October seeing Dr Linus Salmons  Lab Results  Component Value Date   CREATININE 3.0 ([place]) 04/30/2016   Lab Results  Component Value Date   HCT 30.0 ([place]) 04/30/2016   Last visit started on midodrine to support BP for dialysis     Recent Labs: 05/27/2015: HDL 39; LDL Cholesterol 80 10/20/2015:  Pro B Natriuretic peptide (BNP) 465.0 02/20/2016: TSH 1.42 04/30/2016: ALT 19; Creatinine 3.0; HGB 10.0; Potassium 4.2  Wt Readings from Last 3 Encounters:  05/04/16 157 lb 12.8 oz (71.6 kg)  05/01/16 160 lb (72.6 kg)  04/12/16 151 lb 11.2 oz (68.8 kg)     Past Medical History:  Diagnosis Date  . Anemia   . Anginal pain ([facility])    used NTG x 2 May 31 and 06/15/13   . Anxiety   . Arthritis   . B12 deficiency 12/04/2014  . Breast cancer ([place])   . CKD (chronic kidney disease) stage 3, GFR 30-59 ml/min   . Complication of anesthesia   . COPD (chronic obstructive pulmonary disease) ([place])   . Cryptococcal pneumonitis ([place]) 11/22/2015  . Depression   . Dizziness   . Dyspnea   . Fibromyalgia   . Fibromyalgia   . GERD (gastroesophageal reflux disease)   . Headache(784.0)   . Heart murmur   . Hemoptysis 10/21/2015  . History of blood transfusion    last one May 12   . Hx of cardiovascular stress test    LexiScan with low level exercise Myoview (02/2013): No ischemia, EF 72%; normal study  . Hx of echocardiogram    a.  Echocardiogram (12/26/2012): EF 15-17%, grade 1 diastolic dysfunction;   b.  Echocardiogram (02/2013): EF 55-60%, no WMA, trivial effusion  . Hyperkalemia   . Hyponatremia   . Hypothyroidism   . Mucositis   . Multiple myeloma   . Myocardial infarction (  HCC)    in past, patient was unaware.   . Neuropathy   . Pain in joint, pelvic region and thigh 07/07/2015  . PONV (postoperative nausea and vomiting) 2008   after mastestomy    Current Outpatient Prescriptions  Medication Sig Dispense Refill  . acetaminophen (TYLENOL) 325 MG tablet Take 650 mg by mouth every 6 (six) hours as needed for mild pain or moderate pain.     Marland Kitchen albuterol (PROVENTIL HFA;VENTOLIN HFA) 108 (90 Base) MCG/ACT inhaler Inhale 1-2 puffs into the lungs every 6 (six) hours as needed for wheezing or shortness of breath (cough). 1 Inhaler 3  . amoxicillin-clavulanate (AUGMENTIN) 500-125 MG tablet Take 1  tablet (500 mg total) by mouth 2 (two) times daily. 20 tablet 0  . azelastine (ASTELIN) 0.1 % nasal spray Place 2 sprays into both nostrils 2 (two) times daily. Use in each nostril as directed 30 mL 2  . buPROPion (WELLBUTRIN XL) 300 MG 24 hr tablet TAKE 1 TABLET BY MOUTH EACH MORNING FOR MOOD AND DEPRESSION. 90 tablet 1  . butalbital-acetaminophen-caffeine (FIORICET, ESGIC) 50-325-40 MG tablet TAKE 1 OR 2 TABLETS BY MOUTH EVERY 4 HOURS AS NEEDED FOR PAIN OR HEADACHE. 50 tablet 3  . cetirizine (ZYRTEC) 10 MG tablet Take 10 mg by mouth daily.     . Cholecalciferol 4000 UNITS TABS Take 4,000 Units by mouth daily.    . citalopram (CELEXA) 40 MG tablet TAKE 1 TABLET BY MOUTH DAILY OR AS DIRECTED 90 tablet 1  . cromolyn (OPTICROM) 4 % ophthalmic solution Place 1 drop into both eyes 4 (four) times daily.    . fluconazole (DIFLUCAN) 200 MG tablet Take 1 tablet (200 mg total) by mouth daily. 30 tablet 5  . gabapentin (NEURONTIN) 300 MG capsule TAKE 1 CAPSULE BY MOUTH AT BEDTIME. 90 capsule 1  . isosorbide mononitrate (IMDUR) 30 MG 24 hr tablet Take 1 tablet (30 mg total) by mouth daily. 90 tablet 3  . levothyroxine (SYNTHROID, LEVOTHROID) 150 MCG tablet Take 1 tablet (150 mcg total) by mouth daily. (Patient taking differently: Take 150 mcg by mouth daily. ) 90 tablet 1  . loperamide (IMODIUM) 2 MG capsule Take 1 capsule (2 mg total) by mouth as needed for diarrhea or loose stools. 30 capsule 2  . loratadine (CLARITIN) 10 MG tablet Take 10 mg by mouth daily.    Marland Kitchen LORazepam (ATIVAN) 0.5 MG tablet Take 1 tablet (0.5 mg total) by mouth every 8 (eight) hours as needed (nausea). 30 tablet 0  . loteprednol (ALREX) 0.2 % SUSP Place 1 drop into both eyes 4 (four) times daily. 1 Bottle PRN  . magic mouthwash SOLN Take 5 mLs by mouth 4 (four) times daily as needed for mouth pain. 240 mL 1  . meloxicam (MOBIC) 15 MG tablet Take 1 tablet (15 mg total) by mouth daily. 90 tablet 3  . midodrine (PROAMATINE) 10 MG tablet  TAKE 1 TABLET BY MOUTH 3 TIMES DAILY. 270 tablet 3  . montelukast (SINGULAIR) 10 MG tablet TAKE 1 TABLET BY MOUTH DAILY. 30 tablet 2  . nitroGLYCERIN (NITROSTAT) 0.4 MG SL tablet Place 0.4 mg under the tongue every 5 (five) minutes as needed for chest pain. Reported on 03/30/2015    . nystatin (MYCOSTATIN/NYSTOP) powder Apply topically 2 (two) times daily. 30 g 0  . pantoprazole (PROTONIX) 40 MG tablet TAKE 1 TABLET DAILY FOR ACID REFLUX & INDIGESTION 90 tablet 1  . PRESCRIPTION MEDICATION Pt gets chemo at [facility]    Marland Kitchen  promethazine (PHENERGAN) 25 MG tablet Take 25 mg by mouth.    . ranitidine (ZANTAC) 300 MG tablet Take 1 to 2 tablets daily for heartburn & reflux to allow wean and transition from PPI / pantoprazole (Patient taking differently: Take 300 mg by mouth at bedtime. Take 1 to 2 tablets daily for heartburn & reflux to allow wean and transition from PPI / pantoprazole) 180 tablet 3  . senna (SENOKOT) 8.6 MG tablet Take 1 tablet by mouth at bedtime.    . traMADol (ULTRAM) 50 MG tablet Take 1 to 2 tablets by mouth 4 times a day as needed for pain. 180 tablet 0  . vitamin C (ASCORBIC ACID) 250 MG tablet Take 250 mg by mouth daily.    . ferrous sulfate 325 (65 FE) MG tablet Take 1 tablet daily or as directed 90 tablet 1   No current facility-administered medications for this visit.     Allergies:   Codeine; Latex; Other; Onion; Zyprexa [olanzapine]; Adhesive [tape]; Hydrocodone; Iodinated diagnostic agents; and Sulfa antibiotics   Social History:  The patient  reports that she quit smoking about 11 years ago. Her smoking use included Cigarettes. She has a 30.00 pack-year smoking history. She has never used smokeless tobacco. She reports that she does not drink alcohol or use drugs.   Family History:  The patient's family history includes Arthritis in her mother; Asthma in her mother; Cancer in her sister; Hyperlipidemia in her brother.   ROS:  Please see the history of present illness.   She  denies fevers, chills, cough, melena, hematochezia.   All other systems reviewed and negative.   PHYSICAL EXAM: VS:  BP 130/62   Pulse 61   Ht '5\' 5"'$  (1.651 m)   Wt 157 lb 12.8 oz (71.6 kg)   BMI 26.26 kg/m  BP 130 / palp standing with no  dizzyness  Well nourished, well developed, in no acute distress  HEENT: normal  Neck: no JVD  Vascular: left carotid  Chest: Tender to palpation Cardiac:  normal S1, S2; RRR; no murmur no rub Lungs:  clear to auscultation bilaterally, no wheezing, rhonchi or rales  Abd: soft, + epigastric tenderness, no hepatomegaly  Ext: no edema  Skin: warm and dry  Neuro:  CNs 2-12 intact, no focal abnormalities noted Large fistula in LUE with thrill   EKG:   04/20/14  NSR, HR 65, normal axis, nonspecific ST-T wave changes    05/04/16  SR rate 61 biphasic T wave in lead V2   ASSESSMENT AND PLAN:  1. Chest Pain: resolved two normal myovue's last 02/24/13  2. Postural Hypotension improved off florinef continue midodrine  3. CKD: Continue follow up with nephrology. Some LUE steal from fistula Cr  3,4 range  4. Multiple Myeloma: Continue follow up with oncology. Primary issue is renal impact/failure   Given chemo Rx will check echo make sure EF staying normal  5. COPD: Formal testing is pending. Follow up with pulmonology as planned. 6.  Left bruit duplex no stenosis 03/2015  Plaque no stenosis f/u duplex 03/2017  7. Cryptococcal pneumonia :  f/u Dr Linus Salmons continue Difulucan   F/u with me in a year Duplex carotid 03/2015  Jenkins Rouge

## 2016-05-04 NOTE — Patient Instructions (Signed)
Schedule echocardiogram    Your physician wants you to follow-up in: 1 year. You will receive a reminder letter in the mail two months in advance. If you don't receive a letter, please call our office to schedule the follow-up appointment.

## 2016-05-07 ENCOUNTER — Ambulatory Visit (HOSPITAL_BASED_OUTPATIENT_CLINIC_OR_DEPARTMENT_OTHER): Payer: Medicare Other | Admitting: Nurse Practitioner

## 2016-05-07 ENCOUNTER — Ambulatory Visit (HOSPITAL_BASED_OUTPATIENT_CLINIC_OR_DEPARTMENT_OTHER): Payer: Medicare Other

## 2016-05-07 ENCOUNTER — Encounter: Payer: Self-pay | Admitting: *Deleted

## 2016-05-07 ENCOUNTER — Other Ambulatory Visit (HOSPITAL_BASED_OUTPATIENT_CLINIC_OR_DEPARTMENT_OTHER): Payer: Medicare Other

## 2016-05-07 VITALS — BP 111/51 | HR 65 | Temp 98.4°F | Resp 18 | Wt 153.3 lb

## 2016-05-07 DIAGNOSIS — Z5111 Encounter for antineoplastic chemotherapy: Secondary | ICD-10-CM | POA: Diagnosis not present

## 2016-05-07 DIAGNOSIS — D631 Anemia in chronic kidney disease: Secondary | ICD-10-CM | POA: Diagnosis not present

## 2016-05-07 DIAGNOSIS — Z5112 Encounter for antineoplastic immunotherapy: Secondary | ICD-10-CM

## 2016-05-07 DIAGNOSIS — N189 Chronic kidney disease, unspecified: Secondary | ICD-10-CM

## 2016-05-07 DIAGNOSIS — C9000 Multiple myeloma not having achieved remission: Secondary | ICD-10-CM

## 2016-05-07 DIAGNOSIS — R109 Unspecified abdominal pain: Secondary | ICD-10-CM | POA: Diagnosis not present

## 2016-05-07 DIAGNOSIS — C9002 Multiple myeloma in relapse: Secondary | ICD-10-CM | POA: Diagnosis not present

## 2016-05-07 LAB — CBC WITH DIFFERENTIAL/PLATELET
BASO%: 0.4 % (ref 0.0–2.0)
Basophils Absolute: 0 10*3/uL (ref 0.0–0.1)
EOS%: 3.7 % (ref 0.0–7.0)
Eosinophils Absolute: 0.2 10*3/uL (ref 0.0–0.5)
HEMATOCRIT: 29.3 % — AB (ref 34.8–46.6)
HGB: 9.7 g/dL — ABNORMAL LOW (ref 11.6–15.9)
LYMPH#: 0.9 10*3/uL (ref 0.9–3.3)
LYMPH%: 18.4 % (ref 14.0–49.7)
MCH: 35.4 pg — ABNORMAL HIGH (ref 25.1–34.0)
MCHC: 32.9 g/dL (ref 31.5–36.0)
MCV: 107.5 fL — ABNORMAL HIGH (ref 79.5–101.0)
MONO#: 0.7 10*3/uL (ref 0.1–0.9)
MONO%: 16.1 % — ABNORMAL HIGH (ref 0.0–14.0)
NEUT%: 61.4 % (ref 38.4–76.8)
NEUTROS ABS: 2.8 10*3/uL (ref 1.5–6.5)
Platelets: 82 10*3/uL — ABNORMAL LOW (ref 145–400)
RBC: 2.72 10*6/uL — ABNORMAL LOW (ref 3.70–5.45)
RDW: 13.9 % (ref 11.2–14.5)
WBC: 4.6 10*3/uL (ref 3.9–10.3)

## 2016-05-07 LAB — COMPREHENSIVE METABOLIC PANEL
ALBUMIN: 3.7 g/dL (ref 3.5–5.0)
ALK PHOS: 109 U/L (ref 40–150)
ALT: 30 U/L (ref 0–55)
AST: 14 U/L (ref 5–34)
Anion Gap: 11 mEq/L (ref 3–11)
BUN: 32.9 mg/dL — AB (ref 7.0–26.0)
CO2: 25 mEq/L (ref 22–29)
CREATININE: 3 mg/dL — AB (ref 0.6–1.1)
Calcium: 9.1 mg/dL (ref 8.4–10.4)
Chloride: 108 mEq/L (ref 98–109)
EGFR: 15 mL/min/{1.73_m2} — ABNORMAL LOW (ref >90)
Glucose: 105 mg/dl (ref 70–140)
Potassium: 4.8 mEq/L (ref 3.5–5.1)
Sodium: 143 mEq/L (ref 136–145)
TOTAL PROTEIN: 6.1 g/dL — AB (ref 6.4–8.3)
Total Bilirubin: 0.74 mg/dL (ref 0.20–1.20)

## 2016-05-07 MED ORDER — DEXAMETHASONE SODIUM PHOSPHATE 10 MG/ML IJ SOLN
10.0000 mg | Freq: Once | INTRAMUSCULAR | Status: AC
  Administered 2016-05-07: 10 mg via INTRAVENOUS

## 2016-05-07 MED ORDER — SODIUM CHLORIDE 0.9 % IV SOLN
Freq: Once | INTRAVENOUS | Status: AC
  Administered 2016-05-07: 13:00:00 via INTRAVENOUS

## 2016-05-07 MED ORDER — DEXTROSE 5 % IV SOLN
27.0000 mg/m2 | Freq: Once | INTRAVENOUS | Status: AC
  Administered 2016-05-07: 50 mg via INTRAVENOUS
  Filled 2016-05-07: qty 25

## 2016-05-07 MED ORDER — ONDANSETRON HCL 8 MG PO TABS
ORAL_TABLET | ORAL | Status: AC
  Filled 2016-05-07: qty 1

## 2016-05-07 MED ORDER — HEPARIN SOD (PORK) LOCK FLUSH 100 UNIT/ML IV SOLN
500.0000 [IU] | Freq: Once | INTRAVENOUS | Status: AC | PRN
  Administered 2016-05-07: 500 [IU]
  Filled 2016-05-07: qty 5

## 2016-05-07 MED ORDER — SODIUM CHLORIDE 0.9 % IJ SOLN
10.0000 mL | INTRAMUSCULAR | Status: DC | PRN
  Administered 2016-05-07: 10 mL
  Filled 2016-05-07: qty 10

## 2016-05-07 MED ORDER — DEXAMETHASONE SODIUM PHOSPHATE 10 MG/ML IJ SOLN
INTRAMUSCULAR | Status: AC
  Filled 2016-05-07: qty 1

## 2016-05-07 MED ORDER — SODIUM CHLORIDE 0.9 % IV SOLN
225.0000 mg/m2 | Freq: Once | INTRAVENOUS | Status: AC
  Administered 2016-05-07: 420 mg via INTRAVENOUS
  Filled 2016-05-07: qty 21

## 2016-05-07 MED ORDER — ONDANSETRON HCL 8 MG PO TABS
8.0000 mg | ORAL_TABLET | Freq: Once | ORAL | Status: AC
  Administered 2016-05-07: 8 mg via ORAL

## 2016-05-07 NOTE — Progress Notes (Addendum)
[facility] OFFICE PROGRESS NOTE   DIAGNOSIS:  1. 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 Recurrent multiple myeloma initially diagnosed as a smoldering myeloma at [facility] in 2002. 3. Ductal carcinoma in situ status post mastectomy with sentinel lymph node biopsy in October 2008.  PRIOR THERAPY: 1. Status post treatment with tamoxifen from November 2008 through February 2009, discontinued secondary to intolerance. 2. Status post 3 cycles of chemotherapy with Revlimid and Decadron followed by 1 cycle of Decadron only with mild response. 3. Status post 2 cycles of systemic chemotherapy with Velcade, Doxil and Decadron discontinued secondary to significant peripheral neuropathy. Last dose was given May 2010 at [facility]. 4. Status post autologous peripheral blood stem cell transplant on October 01, 2008 at [facility] under the care of Dr. Phyllis Ginger.  5. Systemic chemotherapy with Carfilzomib initially was 20 mg/M2 and will be increased after cycle #1 to 36 mg/M2 on days 1, 2, 8, 9, 15 and 16 every 4 weeks in addition to Cytoxan 300 mg/M2 and Decadron 40 mg by mouth weekly basis, status post 4 cycles. First cycle on 12/29/2012. 6. She resumed chemotherapy with Carfilzomib, Cytoxan, and Decadron on 05/03/2014. Status post 6 cycles. 7. systemic chemotherapy again with Carfilzomib, Cytoxan and Decadron on 07/11/2015. Status post 3 cycles. 8. She completed a course of treatment with Diflucan 200 mg by mouth daily for around 6 weeks for cryptococcal infection of the lung.  CURRENT THERAPY: Resuming treatment again with Carfilzomib, Cytoxan and Decadron, first cycle 04/23/2016.    INTERVAL HISTORY:   Nancy Norris returns as scheduled. She resumed treatment with Carfilzomib/Cytoxan/dexamethasone 04/23/2016. She has nausea relieved with Compazine. No mouth sores. No diarrhea or constipation. She has chronic exertional dyspnea which  worsens at times. No fever or cough. She reports a 2 month history of intermittent left sided abdominal pain. The pain occurs no more often that once a week, lasts 1-2 minutes and resolves spontaneously. No associated symptoms.   Objective:  Vital signs in last 24 hours:  Blood pressure (!) 111/51, pulse 65, temperature 98.4 F (36.9 C), temperature source Oral, resp. rate 18, weight 153 lb 4.8 oz (69.5 kg), SpO2 98 %.    HEENT: No thrush or ulcers. Resp: Lungs clear bilaterally. Cardio: Regular rate and rhythm. GI: Abdomen is soft. Tender left mid abdomen. No organomegaly. Vascular: Trace lower leg edema bilaterally. Neuro: Alert and oriented.  Portacath without erythema.    Lab Results:  Lab Results  Component Value Date   WBC 4.6 05/07/2016   HGB 9.7 (L) 05/07/2016   HCT 29.3 (L) 05/07/2016   MCV 107.5 (H) 05/07/2016   PLT 82 (L) 05/07/2016   NEUTROABS 2.8 05/07/2016    Imaging:  No results found.  Medications: I have reviewed the patient's current medications.  Assessment/Plan: 1. Relapsed multiple myeloma status post several chemotherapy regimens; most recently has been on treatment with Carfilzomib, Cytoxan and Decadron with interruption a few times based on her request for treatment breaks or recently because of her treatment with Diflucan for pulmonary cryptococcal infection. Treatment resumed 04/23/2016. 2. Pulmonary cryptococcal infection. She continues Diflucan. Followed by Dr. Linus Salmons. 3. Renal insufficiency followed by Dr. Arty Baumgartner. 4. Anemia of chronic disease. 5. Left side abdominal pain.    Disposition: Nancy Norris appears stable. Patient seen with Dr. Julien Nordmann at today's visit. Labs reviewed. Plan to proceed with cycle 1 week 3 Carfilzomib/cytoxan/dexamethasone today as scheduled.   She understands the platelet count  is decreased and to call with any bleeding.   If the abdominal pain worsens the plan is to proceed with imaging.   She will return for a  follow-up visit and cycle 2 Carfilzomib/cytoxan/dexamethasone in 2 weeks. She will contact the office in the interim as outlined above or with any other problems.   Patient seen with Dr. Julien Nordmann.     Ned Card ANP/GNP-BC   05/07/2016  12:08 PM  Addendum: Hematology/Oncology Attending: I had a face to face encounter with the patient. I recommended her care plan. This is a very pleasant 76 years old white female with relapsed multiple myeloma as well as chronic renal disease. The patient is currently undergoing systemic chemotherapy with Carfilzomib, Cytoxan and dexamethasone and has been tolerating her treatment well. She is here today for evaluation before starting day 15 of her first cycle after resuming the treatment.  She is feeling fine but her platelets count are low. I recommended for her to proceed with the treatment today as planned. We will continue to monitor her counts closely. She is also complaining of intermittent abdominal pain. We will continue to monitor this closely and if there is any worsening of her condition will consider doing CT scan of the abdomen and pelvis to rule out any acute abnormalities. The patient would come back for follow-up visit in 2 weeks for evaluation before starting cycle #2. She was advised to call immediately if she has any concerning symptoms in the interval.  Disclaimer: This note was dictated with voice recognition software. Similar sounding words can inadvertently be transcribed and may be missed upon review. [name]., MD 05/08/16

## 2016-05-07 NOTE — Progress Notes (Signed)
Per Nancy Haw, NP okay to treat patient with platelet 82 and creatinine 3.0. Patient assisted to infusion room.

## 2016-05-07 NOTE — Patient Instructions (Signed)
[facility] Discharge Instructions for Patients Receiving Chemotherapy  Today you received the following chemotherapy agents cytoxan/kyprolis   To help prevent nausea and vomiting after your treatment, we encourage you to take your nausea medication as directed  If you develop nausea and vomiting that is not controlled by your nausea medication, call the clinic.   BELOW ARE SYMPTOMS THAT SHOULD BE REPORTED IMMEDIATELY:  *FEVER GREATER THAN 100.5 F  *CHILLS WITH OR WITHOUT FEVER  NAUSEA AND VOMITING THAT IS NOT CONTROLLED WITH YOUR NAUSEA MEDICATION  *UNUSUAL SHORTNESS OF BREATH  *UNUSUAL BRUISING OR BLEEDING  TENDERNESS IN MOUTH AND THROAT WITH OR WITHOUT PRESENCE OF ULCERS  *URINARY PROBLEMS  *BOWEL PROBLEMS  UNUSUAL RASH Items with * indicate a potential emergency and should be followed up as soon as possible.  Feel free to call the clinic you have any questions or concerns. The clinic phone number is [phone].

## 2016-05-08 ENCOUNTER — Ambulatory Visit (HOSPITAL_BASED_OUTPATIENT_CLINIC_OR_DEPARTMENT_OTHER): Payer: Medicare Other

## 2016-05-08 VITALS — BP 129/57 | HR 71 | Temp 98.9°F | Resp 18

## 2016-05-08 DIAGNOSIS — C9000 Multiple myeloma not having achieved remission: Secondary | ICD-10-CM | POA: Diagnosis not present

## 2016-05-08 DIAGNOSIS — Z5112 Encounter for antineoplastic immunotherapy: Secondary | ICD-10-CM | POA: Diagnosis not present

## 2016-05-08 MED ORDER — DEXAMETHASONE SODIUM PHOSPHATE 10 MG/ML IJ SOLN
10.0000 mg | Freq: Once | INTRAMUSCULAR | Status: AC
  Administered 2016-05-08: 10 mg via INTRAVENOUS

## 2016-05-08 MED ORDER — DEXTROSE 5 % IV SOLN
27.0000 mg/m2 | Freq: Once | INTRAVENOUS | Status: AC
  Administered 2016-05-08: 50 mg via INTRAVENOUS
  Filled 2016-05-08: qty 25

## 2016-05-08 MED ORDER — SODIUM CHLORIDE 0.9 % IV SOLN: Freq: Once | INTRAVENOUS | Status: DC

## 2016-05-08 MED ORDER — DEXAMETHASONE SODIUM PHOSPHATE 10 MG/ML IJ SOLN
INTRAMUSCULAR | Status: AC
  Filled 2016-05-08: qty 1

## 2016-05-08 MED ORDER — ACETAMINOPHEN 325 MG PO TABS
ORAL_TABLET | ORAL | Status: AC
  Filled 2016-05-08: qty 2

## 2016-05-08 MED ORDER — SODIUM CHLORIDE 0.9 % IJ SOLN
10.0000 mL | INTRAMUSCULAR | Status: DC | PRN
  Administered 2016-05-08: 10 mL
  Filled 2016-05-08: qty 10

## 2016-05-08 MED ORDER — ACETAMINOPHEN 325 MG PO TABS
650.0000 mg | ORAL_TABLET | Freq: Once | ORAL | Status: AC
  Administered 2016-05-08: 650 mg via ORAL

## 2016-05-08 MED ORDER — ONDANSETRON HCL 8 MG PO TABS
ORAL_TABLET | ORAL | Status: AC
  Filled 2016-05-08: qty 1

## 2016-05-08 MED ORDER — SODIUM CHLORIDE 0.9 % IV SOLN
Freq: Once | INTRAVENOUS | Status: AC
  Administered 2016-05-08: 14:00:00 via INTRAVENOUS

## 2016-05-08 MED ORDER — HEPARIN SOD (PORK) LOCK FLUSH 100 UNIT/ML IV SOLN
500.0000 [IU] | Freq: Once | INTRAVENOUS | Status: AC | PRN
  Administered 2016-05-08: 500 [IU]
  Filled 2016-05-08: qty 5

## 2016-05-08 MED ORDER — ONDANSETRON HCL 8 MG PO TABS
8.0000 mg | ORAL_TABLET | Freq: Once | ORAL | Status: AC
  Administered 2016-05-08: 8 mg via ORAL

## 2016-05-08 NOTE — Patient Instructions (Signed)
[facility] Discharge Instructions for Patients Receiving Chemotherapy  Today you received the following chemotherapy agents:  Kyprolis   To help prevent nausea and vomiting after your treatment, we encourage you to take your nausea medication as ordered by your doctor.   If you develop nausea and vomiting that is not controlled by your nausea medication, call the clinic.   BELOW ARE SYMPTOMS THAT SHOULD BE REPORTED IMMEDIATELY:  *FEVER GREATER THAN 100.5 F  *CHILLS WITH OR WITHOUT FEVER  NAUSEA AND VOMITING THAT IS NOT CONTROLLED WITH YOUR NAUSEA MEDICATION  *UNUSUAL SHORTNESS OF BREATH  *UNUSUAL BRUISING OR BLEEDING  TENDERNESS IN MOUTH AND THROAT WITH OR WITHOUT PRESENCE OF ULCERS  *URINARY PROBLEMS  *BOWEL PROBLEMS  UNUSUAL RASH Items with * indicate a potential emergency and should be followed up as soon as possible.  Feel free to call the clinic you have any questions or concerns. The clinic phone number is [phone].  Please show the [place] at check-in to the Emergency Department and triage nurse.

## 2016-05-14 ENCOUNTER — Telehealth: Payer: Self-pay | Admitting: Internal Medicine

## 2016-05-14 NOTE — Telephone Encounter (Signed)
Called to confirm appt - no answer and no voicemail to leave message with appt date and time. Mail letter to patient.

## 2016-05-15 DIAGNOSIS — H43392 Other vitreous opacities, left eye: Secondary | ICD-10-CM | POA: Diagnosis not present

## 2016-05-15 DIAGNOSIS — H43391 Other vitreous opacities, right eye: Secondary | ICD-10-CM | POA: Diagnosis not present

## 2016-05-15 DIAGNOSIS — H43811 Vitreous degeneration, right eye: Secondary | ICD-10-CM | POA: Diagnosis not present

## 2016-05-15 DIAGNOSIS — H353131 Nonexudative age-related macular degeneration, bilateral, early dry stage: Secondary | ICD-10-CM | POA: Diagnosis not present

## 2016-05-16 ENCOUNTER — Other Ambulatory Visit (HOSPITAL_COMMUNITY): Payer: Medicare Other

## 2016-05-21 ENCOUNTER — Other Ambulatory Visit: Payer: Medicare Other

## 2016-05-21 ENCOUNTER — Ambulatory Visit: Payer: Medicare Other

## 2016-05-21 ENCOUNTER — Ambulatory Visit (HOSPITAL_BASED_OUTPATIENT_CLINIC_OR_DEPARTMENT_OTHER): Payer: Medicare Other | Admitting: Internal Medicine

## 2016-05-21 ENCOUNTER — Encounter: Payer: Self-pay | Admitting: Internal Medicine

## 2016-05-21 ENCOUNTER — Other Ambulatory Visit (HOSPITAL_BASED_OUTPATIENT_CLINIC_OR_DEPARTMENT_OTHER): Payer: Medicare Other

## 2016-05-21 VITALS — BP 118/54 | HR 81 | Temp 99.0°F | Resp 18 | Ht 65.0 in | Wt 156.2 lb

## 2016-05-21 DIAGNOSIS — C9000 Multiple myeloma not having achieved remission: Secondary | ICD-10-CM

## 2016-05-21 DIAGNOSIS — C9002 Multiple myeloma in relapse: Secondary | ICD-10-CM

## 2016-05-21 DIAGNOSIS — N185 Chronic kidney disease, stage 5: Secondary | ICD-10-CM

## 2016-05-21 DIAGNOSIS — F329 Major depressive disorder, single episode, unspecified: Secondary | ICD-10-CM

## 2016-05-21 DIAGNOSIS — Z5111 Encounter for antineoplastic chemotherapy: Secondary | ICD-10-CM

## 2016-05-21 LAB — CBC WITH DIFFERENTIAL/PLATELET
BASO%: 1 % (ref 0.0–2.0)
Basophils Absolute: 0 10*3/uL (ref 0.0–0.1)
EOS%: 3.4 % (ref 0.0–7.0)
Eosinophils Absolute: 0.1 10*3/uL (ref 0.0–0.5)
HEMATOCRIT: 27.2 % — AB (ref 34.8–46.6)
HGB: 9.1 g/dL — ABNORMAL LOW (ref 11.6–15.9)
LYMPH#: 0.6 10*3/uL — AB (ref 0.9–3.3)
LYMPH%: 17.8 % (ref 14.0–49.7)
MCH: 36.2 pg — ABNORMAL HIGH (ref 25.1–34.0)
MCHC: 33.5 g/dL (ref 31.5–36.0)
MCV: 108.1 fL — ABNORMAL HIGH (ref 79.5–101.0)
MONO#: 0.6 10*3/uL (ref 0.1–0.9)
MONO%: 18 % — ABNORMAL HIGH (ref 0.0–14.0)
NEUT%: 59.8 % (ref 38.4–76.8)
NEUTROS ABS: 2.1 10*3/uL (ref 1.5–6.5)
PLATELETS: 97 10*3/uL — AB (ref 145–400)
RBC: 2.52 10*6/uL — ABNORMAL LOW (ref 3.70–5.45)
RDW: 15 % — ABNORMAL HIGH (ref 11.2–14.5)
WBC: 3.5 10*3/uL — AB (ref 3.9–10.3)

## 2016-05-21 LAB — COMPREHENSIVE METABOLIC PANEL
ALBUMIN: 3.9 g/dL (ref 3.5–5.0)
ALT: 19 U/L (ref 0–55)
ANION GAP: 10 meq/L (ref 3–11)
AST: 16 U/L (ref 5–34)
Alkaline Phosphatase: 106 U/L (ref 40–150)
BILIRUBIN TOTAL: 1.15 mg/dL (ref 0.20–1.20)
BUN: 23 mg/dL (ref 7.0–26.0)
CALCIUM: 8.7 mg/dL (ref 8.4–10.4)
CO2: 22 mEq/L (ref 22–29)
CREATININE: 2.7 mg/dL — AB (ref 0.6–1.1)
Chloride: 112 mEq/L — ABNORMAL HIGH (ref 98–109)
EGFR: 17 mL/min/{1.73_m2} — ABNORMAL LOW (ref >90)
Glucose: 103 mg/dl (ref 70–140)
Potassium: 3.9 mEq/L (ref 3.5–5.1)
Sodium: 144 mEq/L (ref 136–145)
TOTAL PROTEIN: 6.1 g/dL — AB (ref 6.4–8.3)

## 2016-05-21 NOTE — Progress Notes (Signed)
[place] [phone]   [phone]  OFFICE PROGRESS NOTE  [name], [place] [address] [place] [address]  DIAGNOSIS:  1. 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 Recurrent multiple myeloma initially diagnosed as a smoldering myeloma at [facility] in 2002. 3. Ductal carcinoma in situ status post mastectomy with sentinel lymph node biopsy in October 2008.  PRIOR THERAPY: 1. Status post treatment with tamoxifen from November 2008 through February 2009, discontinued secondary to intolerance. 2. Status post 3 cycles of chemotherapy with Revlimid and Decadron followed by 1 cycle of Decadron only with mild response. 3. Status post 2 cycles of systemic chemotherapy with Velcade, Doxil and Decadron discontinued secondary to significant peripheral neuropathy. Last dose was given May 2010 at [facility]. 4. Status post autologous peripheral blood stem cell transplant on October 01, 2008 at [facility] under the care of Dr. Phyllis Ginger.  5. Systemic chemotherapy with Carfilzomib initially was 20 mg/M2 and will be increased after cycle #1 to 36 mg/M2 on days 1, 2, 8, 9, 15 and 16 every 4 weeks in addition to Cytoxan 300 mg/M2 and Decadron 40 mg by mouth weekly basis, status post 4 cycles. First cycle on 12/29/2012. 6. She resumed chemotherapy with Carfilzomib, Cytoxan, and Decadron on 05/03/2014. Status post 6 cycles. 7. systemic chemotherapy again with Carfilzomib, Cytoxan and Decadron on 07/11/2015. Status post 3 cycles. 8. She completed a course of treatment with Diflucan 200 mg by mouth daily for around 6 weeks for cryptococcal infection of the lung.  CURRENT THERAPY: Resuming treatment again with Carfilzomib, Cytoxan and Decadron, first cycle 04/23/2016.  INTERVAL HISTORY: Nancy Norris 76 y.o. female returns to the clinic today for follow-up visit. The patient is feeling fine  except for increasing fatigue and weakness as well as depression. She would like to delay the start of cycle #2 x 1 week to be beside her mom who is dying. She denied having any chest pain, shortness of breath, cough or hemoptysis. She denied having any recent weight loss or night sweats. She has no nausea or vomiting. She is here today for evaluation and repeat blood work.  MEDICAL HISTORY: Past Medical History:  Diagnosis Date  . Anemia   . Anginal pain ([place])    used NTG x 2 May 31 and 06/15/13   . Anxiety   . Arthritis   . B12 deficiency 12/04/2014  . Breast cancer ([place])   . CKD (chronic kidney disease) stage 3, GFR 30-59 ml/min   . Complication of anesthesia   . COPD (chronic obstructive pulmonary disease) ([place])   . Cryptococcal pneumonitis ([place]) 11/22/2015  . Depression   . Dizziness   . Dyspnea   . Fibromyalgia   . Fibromyalgia   . GERD (gastroesophageal reflux disease)   . Headache(784.0)   . Heart murmur   . Hemoptysis 10/21/2015  . History of blood transfusion    last one May 12   . Hx of cardiovascular stress test    LexiScan with low level exercise Myoview (02/2013): No ischemia, EF 72%; normal study  . Hx of echocardiogram    a.  Echocardiogram (12/26/2012): EF 59-93%, grade 1 diastolic dysfunction;   b.  Echocardiogram (02/2013): EF 55-60%, no WMA, trivial effusion  . Hyperkalemia   . Hyponatremia   . Hypothyroidism   . Mucositis   . Multiple myeloma   . Myocardial infarction ([place])  in past, patient was unaware.   . Neuropathy   . Pain in joint, pelvic region and thigh 07/07/2015  . PONV (postoperative nausea and vomiting) 2008   after mastestomy    ALLERGIES:  is allergic to codeine; latex; other; onion; zyprexa [olanzapine]; adhesive [tape]; hydrocodone; iodinated diagnostic agents; and sulfa antibiotics.  MEDICATIONS:  Current Outpatient Prescriptions  Medication Sig Dispense Refill  . acetaminophen (TYLENOL) 325 MG tablet Take 650 mg by mouth every 6  (six) hours as needed for mild pain or moderate pain.     Marland Kitchen albuterol (PROVENTIL HFA;VENTOLIN HFA) 108 (90 Base) MCG/ACT inhaler Inhale 1-2 puffs into the lungs every 6 (six) hours as needed for wheezing or shortness of breath (cough). 1 Inhaler 3  . azelastine (ASTELIN) 0.1 % nasal spray Place 2 sprays into both nostrils 2 (two) times daily. Use in each nostril as directed 30 mL 2  . buPROPion (WELLBUTRIN XL) 300 MG 24 hr tablet TAKE 1 TABLET BY MOUTH EACH MORNING FOR MOOD AND DEPRESSION. 90 tablet 1  . butalbital-acetaminophen-caffeine (FIORICET, ESGIC) 50-325-40 MG tablet TAKE 1 OR 2 TABLETS BY MOUTH EVERY 4 HOURS AS NEEDED FOR PAIN OR HEADACHE. 50 tablet 3  . Cholecalciferol 4000 UNITS TABS Take 4,000 Units by mouth daily.    . citalopram (CELEXA) 40 MG tablet TAKE 1 TABLET BY MOUTH DAILY OR AS DIRECTED 90 tablet 1  . cromolyn (OPTICROM) 4 % ophthalmic solution Place 1 drop into both eyes 4 (four) times daily.    . ferrous sulfate 325 (65 FE) MG tablet Take 1 tablet daily or as directed 90 tablet 1  . fluconazole (DIFLUCAN) 200 MG tablet Take 1 tablet (200 mg total) by mouth daily. 30 tablet 5  . gabapentin (NEURONTIN) 300 MG capsule TAKE 1 CAPSULE BY MOUTH AT BEDTIME. 90 capsule 1  . isosorbide mononitrate (IMDUR) 30 MG 24 hr tablet Take 1 tablet (30 mg total) by mouth daily. 90 tablet 3  . levothyroxine (SYNTHROID, LEVOTHROID) 150 MCG tablet Take 1 tablet (150 mcg total) by mouth daily. (Patient taking differently: Take 150 mcg by mouth daily. ) 90 tablet 1  . loperamide (IMODIUM) 2 MG capsule Take 1 capsule (2 mg total) by mouth as needed for diarrhea or loose stools. 30 capsule 2  . LORazepam (ATIVAN) 0.5 MG tablet Take 1 tablet (0.5 mg total) by mouth every 8 (eight) hours as needed (nausea). 30 tablet 0  . loteprednol (ALREX) 0.2 % SUSP Place 1 drop into both eyes 4 (four) times daily. 1 Bottle PRN  . magic mouthwash SOLN Take 5 mLs by mouth 4 (four) times daily as needed for mouth pain.  240 mL 1  . meloxicam (MOBIC) 15 MG tablet Take 1 tablet (15 mg total) by mouth daily. 90 tablet 3  . midodrine (PROAMATINE) 10 MG tablet TAKE 1 TABLET BY MOUTH 3 TIMES DAILY. 270 tablet 3  . montelukast (SINGULAIR) 10 MG tablet TAKE 1 TABLET BY MOUTH DAILY. 30 tablet 2  . nitroGLYCERIN (NITROSTAT) 0.4 MG SL tablet Place 0.4 mg under the tongue every 5 (five) minutes as needed for chest pain. Reported on 03/30/2015    . nystatin (MYCOSTATIN/NYSTOP) powder Apply topically 2 (two) times daily. 30 g 0  . PRESCRIPTION MEDICATION Pt gets chemo at [facility].    . ranitidine (ZANTAC) 300 MG tablet Take 1 to 2 tablets daily for heartburn & reflux to allow wean and transition from PPI / pantoprazole (Patient taking differently: Take 300 mg by mouth at bedtime.  Take 1 to 2 tablets daily for heartburn & reflux to allow wean and transition from PPI / pantoprazole) 180 tablet 3  . senna (SENOKOT) 8.6 MG tablet Take 1 tablet by mouth at bedtime.    . traMADol (ULTRAM) 50 MG tablet Take 1 to 2 tablets by mouth 4 times a day as needed for pain. 180 tablet 0  . vitamin C (ASCORBIC ACID) 250 MG tablet Take 250 mg by mouth daily.     No current facility-administered medications for this visit.     SURGICAL HISTORY:  Past Surgical History:  Procedure Laterality Date  . ABDOMINAL HYSTERECTOMY  1981  . AV FISTULA PLACEMENT Left 06/19/2013   Procedure: CREATION OF LEFT ARM ARTERIOVENOUS (AV) FISTULA ;  Surgeon: Angelia Mould, MD;  Location: [facility];  Service: Vascular;  Laterality: Left;  . BREAST RECONSTRUCTION    . BREAST SURGERY    . CATARACT EXTRACTION, BILATERAL    . CHOLECYSTECTOMY  1971  . EYE SURGERY Bilateral    lens implant  . history of Port removal    . LUNG BIOPSY  10/21/2015  . MASTECTOMY Left 2008  . PORTACATH PLACEMENT  12/2012   has had 2  . Status post stem cell transplant on September 28, 2008.      REVIEW OF SYSTEMS:  A comprehensive review of systems was negative except for:  Constitutional: positive for fatigue Behavioral/Psych: positive for depression   PHYSICAL EXAMINATION: General appearance: alert, cooperative, fatigued and no distress Head: Normocephalic, without obvious abnormality, atraumatic Neck: no adenopathy, no JVD, supple, symmetrical, trachea midline and thyroid not enlarged, symmetric, no tenderness/mass/nodules Lymph nodes: Cervical, supraclavicular, and axillary nodes normal. Resp: clear to auscultation bilaterally Back: symmetric, no curvature. ROM normal. No CVA tenderness. Cardio: regular rate and rhythm, S1, S2 normal, no murmur, click, rub or gallop GI: soft, non-tender; bowel sounds normal; no masses,  no organomegaly Extremities: extremities normal, atraumatic, no cyanosis or edema  ECOG PERFORMANCE STATUS: 1 - Symptomatic but completely ambulatory  Blood pressure (!) 118/54, pulse 81, temperature 99 F (37.2 C), temperature source Oral, resp. rate 18, height '5\' 5"'$  (1.651 m), weight 156 lb 3.2 oz (70.9 kg), SpO2 99 %.  LABORATORY DATA: Lab Results  Component Value Date   WBC 3.5 (L) 05/21/2016   HGB 9.1 (L) 05/21/2016   HCT 27.2 (L) 05/21/2016   MCV 108.1 (H) 05/21/2016   PLT 97 (L) 05/21/2016      Chemistry      Component Value Date/Time   NA 144 05/21/2016 0931   K 3.9 05/21/2016 0931   CL 106 02/20/2016 1213   CL 105 03/19/2012 0811   CO2 22 05/21/2016 0931   BUN 23.0 05/21/2016 0931   CREATININE 2.7 (H) 05/21/2016 0931      Component Value Date/Time   CALCIUM 8.7 05/21/2016 0931   ALKPHOS 106 05/21/2016 0931   AST 16 05/21/2016 0931   ALT 19 05/21/2016 0931   BILITOT 1.15 05/21/2016 0931       RADIOGRAPHIC STUDIES: No results found.  ASSESSMENT AND PLAN:  This is a very pleasant 76 years old white female with relapsed multiple myeloma status post several chemotherapy regimens and she is currently undergoing treatment with Carfilzomib, Cytoxan and Decadron status post 1 cycle of the resumed regimen. She was  supposed to start cycle #2 today. The patient has family issues and her mother is dying. She would like to spend some time with her mother and preferred to delay the start  of cycle #2 x 1 week. I agree with the patient and I will delay her treatment to start next week. I will see her back for follow-up visit in 5 weeks with the start of cycle #3 after repeating myeloma panel in 4 weeks. For depression she is currently on Celexa and Wellbutrin. She will see her primary care physician soon for adjustment of her medication. The patient was advised to call immediately if she has any concerning symptoms in the interval. The patient voices understanding of current disease status and treatment options and is in agreement with the current care plan. All questions were answered. The patient knows to call the clinic with any problems, questions or concerns. We can certainly see the patient much sooner if necessary. I spent 10 minutes counseling the patient face to face. The total time spent in the appointment was 15 minutes.  Disclaimer: This note was dictated with voice recognition software. Similar sounding words can inadvertently be transcribed and may not be corrected upon review.

## 2016-05-22 ENCOUNTER — Ambulatory Visit: Payer: Medicare Other

## 2016-05-22 ENCOUNTER — Telehealth: Payer: Self-pay | Admitting: *Deleted

## 2016-05-22 ENCOUNTER — Telehealth: Payer: Self-pay | Admitting: Internal Medicine

## 2016-05-22 NOTE — Telephone Encounter (Signed)
Scheduled appt per 5/07 los. - called and no answer or voicemail available for message. Sent reminder letter with appt date and time

## 2016-05-22 NOTE — Telephone Encounter (Signed)
"  I was deferred from treatment this week and need to know does this change impact my schedule for next week?" Schedule reads 05-28-2016 beginning with lab at 11:15.  "Thank you.  That's all I needed to know."

## 2016-05-24 ENCOUNTER — Ambulatory Visit (HOSPITAL_COMMUNITY): Payer: Medicare Other | Attending: Cardiology

## 2016-05-24 ENCOUNTER — Other Ambulatory Visit: Payer: Self-pay

## 2016-05-24 DIAGNOSIS — R0602 Shortness of breath: Secondary | ICD-10-CM | POA: Diagnosis not present

## 2016-05-24 DIAGNOSIS — I5032 Chronic diastolic (congestive) heart failure: Secondary | ICD-10-CM | POA: Diagnosis not present

## 2016-05-28 ENCOUNTER — Ambulatory Visit (HOSPITAL_BASED_OUTPATIENT_CLINIC_OR_DEPARTMENT_OTHER): Payer: Medicare Other

## 2016-05-28 ENCOUNTER — Encounter: Payer: Self-pay | Admitting: Internal Medicine

## 2016-05-28 ENCOUNTER — Other Ambulatory Visit (HOSPITAL_BASED_OUTPATIENT_CLINIC_OR_DEPARTMENT_OTHER): Payer: Medicare Other

## 2016-05-28 DIAGNOSIS — Z5112 Encounter for antineoplastic immunotherapy: Secondary | ICD-10-CM

## 2016-05-28 DIAGNOSIS — Z5111 Encounter for antineoplastic chemotherapy: Secondary | ICD-10-CM | POA: Diagnosis not present

## 2016-05-28 DIAGNOSIS — C9000 Multiple myeloma not having achieved remission: Secondary | ICD-10-CM | POA: Diagnosis not present

## 2016-05-28 LAB — CBC WITH DIFFERENTIAL/PLATELET
BASO%: 1.3 % (ref 0.0–2.0)
BASOS ABS: 0.1 10*3/uL (ref 0.0–0.1)
EOS ABS: 0.1 10*3/uL (ref 0.0–0.5)
EOS%: 3.6 % (ref 0.0–7.0)
HEMATOCRIT: 27.7 % — AB (ref 34.8–46.6)
HEMOGLOBIN: 9.1 g/dL — AB (ref 11.6–15.9)
LYMPH%: 21.7 % (ref 14.0–49.7)
MCH: 36.1 pg — AB (ref 25.1–34.0)
MCHC: 32.9 g/dL (ref 31.5–36.0)
MCV: 109.8 fL — ABNORMAL HIGH (ref 79.5–101.0)
MONO#: 0.7 10*3/uL (ref 0.1–0.9)
MONO%: 17.8 % — ABNORMAL HIGH (ref 0.0–14.0)
NEUT%: 55.6 % (ref 38.4–76.8)
NEUTROS ABS: 2.2 10*3/uL (ref 1.5–6.5)
Platelets: 83 10*3/uL — ABNORMAL LOW (ref 145–400)
RBC: 2.52 10*6/uL — ABNORMAL LOW (ref 3.70–5.45)
RDW: 16.2 % — ABNORMAL HIGH (ref 11.2–14.5)
WBC: 4 10*3/uL (ref 3.9–10.3)
lymph#: 0.9 10*3/uL (ref 0.9–3.3)

## 2016-05-28 LAB — COMPREHENSIVE METABOLIC PANEL
ALBUMIN: 3.6 g/dL (ref 3.5–5.0)
ALK PHOS: 122 U/L (ref 40–150)
ALT: 37 U/L (ref 0–55)
AST: 44 U/L — AB (ref 5–34)
Anion Gap: 9 mEq/L (ref 3–11)
BILIRUBIN TOTAL: 0.45 mg/dL (ref 0.20–1.20)
BUN: 30 mg/dL — AB (ref 7.0–26.0)
CALCIUM: 8.8 mg/dL (ref 8.4–10.4)
CO2: 26 mEq/L (ref 22–29)
CREATININE: 2.6 mg/dL — AB (ref 0.6–1.1)
Chloride: 110 mEq/L — ABNORMAL HIGH (ref 98–109)
EGFR: 17 mL/min/{1.73_m2} — ABNORMAL LOW (ref >90)
GLUCOSE: 106 mg/dL (ref 70–140)
Potassium: 4 mEq/L (ref 3.5–5.1)
Sodium: 145 mEq/L (ref 136–145)
TOTAL PROTEIN: 5.7 g/dL — AB (ref 6.4–8.3)

## 2016-05-28 MED ORDER — SODIUM CHLORIDE 0.9 % IJ SOLN
10.0000 mL | INTRAMUSCULAR | Status: DC | PRN
  Administered 2016-05-28: 10 mL
  Filled 2016-05-28: qty 10

## 2016-05-28 MED ORDER — DEXTROSE 5 % IV SOLN
27.0000 mg/m2 | Freq: Once | INTRAVENOUS | Status: AC
  Administered 2016-05-28: 50 mg via INTRAVENOUS
  Filled 2016-05-28: qty 25

## 2016-05-28 MED ORDER — SODIUM CHLORIDE 0.9 % IV SOLN
Freq: Once | INTRAVENOUS | Status: AC
  Administered 2016-05-28: 13:00:00 via INTRAVENOUS

## 2016-05-28 MED ORDER — SODIUM CHLORIDE 0.9 % IV SOLN
225.0000 mg/m2 | Freq: Once | INTRAVENOUS | Status: AC
  Administered 2016-05-28: 420 mg via INTRAVENOUS
  Filled 2016-05-28: qty 21

## 2016-05-28 MED ORDER — ONDANSETRON HCL 8 MG PO TABS
8.0000 mg | ORAL_TABLET | Freq: Once | ORAL | Status: AC
  Administered 2016-05-28: 8 mg via ORAL

## 2016-05-28 MED ORDER — DEXAMETHASONE SODIUM PHOSPHATE 10 MG/ML IJ SOLN
10.0000 mg | Freq: Once | INTRAMUSCULAR | Status: AC
  Administered 2016-05-28: 10 mg via INTRAVENOUS

## 2016-05-28 MED ORDER — ONDANSETRON HCL 4 MG/2ML IJ SOLN
INTRAMUSCULAR | Status: AC
  Filled 2016-05-28: qty 4

## 2016-05-28 MED ORDER — HEPARIN SOD (PORK) LOCK FLUSH 100 UNIT/ML IV SOLN
500.0000 [IU] | Freq: Once | INTRAVENOUS | Status: AC | PRN
Start: 2016-05-28 — End: 2016-05-28
  Administered 2016-05-28: 500 [IU]
  Filled 2016-05-28: qty 5

## 2016-05-28 MED ORDER — DEXAMETHASONE SODIUM PHOSPHATE 10 MG/ML IJ SOLN
INTRAMUSCULAR | Status: AC
  Filled 2016-05-28: qty 1

## 2016-05-28 MED ORDER — ONDANSETRON HCL 8 MG PO TABS
ORAL_TABLET | ORAL | Status: AC
  Filled 2016-05-28: qty 1

## 2016-05-28 NOTE — Progress Notes (Signed)
Ok to treat with plts 83, Ast 44, and Scr 2.6  Per Dr. Julien Nordmann.

## 2016-05-28 NOTE — Patient Instructions (Signed)
[facility] Discharge Instructions for Patients Receiving Chemotherapy  Today you received the following chemotherapy agents Cytoxan and Kyprolis  To help prevent nausea and vomiting after your treatment, we encourage you to take your nausea medication as directed   If you develop nausea and vomiting that is not controlled by your nausea medication, call the clinic.   BELOW ARE SYMPTOMS THAT SHOULD BE REPORTED IMMEDIATELY:  *FEVER GREATER THAN 100.5 F  *CHILLS WITH OR WITHOUT FEVER  NAUSEA AND VOMITING THAT IS NOT CONTROLLED WITH YOUR NAUSEA MEDICATION  *UNUSUAL SHORTNESS OF BREATH  *UNUSUAL BRUISING OR BLEEDING  TENDERNESS IN MOUTH AND THROAT WITH OR WITHOUT PRESENCE OF ULCERS  *URINARY PROBLEMS  *BOWEL PROBLEMS  UNUSUAL RASH Items with * indicate a potential emergency and should be followed up as soon as possible.  Feel free to call the clinic you have any questions or concerns. The clinic phone number is [phone].  Please show the CHEMO ALERT CARD at check-in to the Emergency Department and triage nurse.   

## 2016-05-29 ENCOUNTER — Ambulatory Visit (HOSPITAL_BASED_OUTPATIENT_CLINIC_OR_DEPARTMENT_OTHER): Payer: Medicare Other

## 2016-05-29 VITALS — BP 106/47 | HR 79 | Temp 100.0°F | Resp 18

## 2016-05-29 DIAGNOSIS — C9000 Multiple myeloma not having achieved remission: Secondary | ICD-10-CM | POA: Diagnosis not present

## 2016-05-29 DIAGNOSIS — Z5112 Encounter for antineoplastic immunotherapy: Secondary | ICD-10-CM | POA: Diagnosis not present

## 2016-05-29 DIAGNOSIS — R509 Fever, unspecified: Secondary | ICD-10-CM

## 2016-05-29 MED ORDER — DEXTROSE 5 % IV SOLN
27.0000 mg/m2 | Freq: Once | INTRAVENOUS | Status: AC
  Administered 2016-05-29: 50 mg via INTRAVENOUS
  Filled 2016-05-29: qty 25

## 2016-05-29 MED ORDER — ONDANSETRON HCL 8 MG PO TABS: 8.0000 mg | ORAL_TABLET | Freq: Once | ORAL | Status: DC

## 2016-05-29 MED ORDER — ONDANSETRON HCL 8 MG PO TABS
ORAL_TABLET | ORAL | Status: AC
  Filled 2016-05-29: qty 1

## 2016-05-29 MED ORDER — ACETAMINOPHEN 325 MG PO TABS
650.0000 mg | ORAL_TABLET | Freq: Once | ORAL | Status: AC
  Administered 2016-05-29: 650 mg via ORAL

## 2016-05-29 MED ORDER — DEXAMETHASONE SODIUM PHOSPHATE 10 MG/ML IJ SOLN
INTRAMUSCULAR | Status: AC
  Filled 2016-05-29: qty 1

## 2016-05-29 MED ORDER — DEXAMETHASONE SODIUM PHOSPHATE 10 MG/ML IJ SOLN
10.0000 mg | Freq: Once | INTRAMUSCULAR | Status: AC
  Administered 2016-05-29: 10 mg via INTRAVENOUS

## 2016-05-29 MED ORDER — SODIUM CHLORIDE 0.9 % IV SOLN: Freq: Once | INTRAVENOUS | Status: DC

## 2016-05-29 MED ORDER — ACETAMINOPHEN 325 MG PO TABS
ORAL_TABLET | ORAL | Status: AC
  Filled 2016-05-29: qty 2

## 2016-05-29 MED ORDER — HEPARIN SOD (PORK) LOCK FLUSH 100 UNIT/ML IV SOLN
500.0000 [IU] | Freq: Once | INTRAVENOUS | Status: AC | PRN
  Administered 2016-05-29: 500 [IU]
  Filled 2016-05-29: qty 5

## 2016-05-29 MED ORDER — SODIUM CHLORIDE 0.9 % IV SOLN
Freq: Once | INTRAVENOUS | Status: AC
  Administered 2016-05-29: 09:00:00 via INTRAVENOUS

## 2016-05-29 MED ORDER — SODIUM CHLORIDE 0.9 % IJ SOLN
10.0000 mL | INTRAMUSCULAR | Status: DC | PRN
  Administered 2016-05-29: 10 mL
  Filled 2016-05-29: qty 10

## 2016-05-29 NOTE — Progress Notes (Signed)
Patient's temp is 100.0 orally. Patient states "I feel fine". Dr. Earlie Server made aware. Patient to receive 650 mg Tylenol PO. OK to proceed with chemo per Dr. Julien Nordmann. Patient instructed to take her temperature this afternoon and evening and to report to ED with temp greater than 100.5. Patient voiced understanding.

## 2016-05-29 NOTE — Patient Instructions (Signed)
[place] Discharge Instructions for Patients Receiving Chemotherapy  Today you received the following chemotherapy agents:  Kyprolis   To help prevent nausea and vomiting after your treatment, we encourage you to take your nausea medication as ordered by your doctor.   If you develop nausea and vomiting that is not controlled by your nausea medication, call the clinic.   BELOW ARE SYMPTOMS THAT SHOULD BE REPORTED IMMEDIATELY:  *FEVER GREATER THAN 100.5 F  *CHILLS WITH OR WITHOUT FEVER  NAUSEA AND VOMITING THAT IS NOT CONTROLLED WITH YOUR NAUSEA MEDICATION  *UNUSUAL SHORTNESS OF BREATH  *UNUSUAL BRUISING OR BLEEDING  TENDERNESS IN MOUTH AND THROAT WITH OR WITHOUT PRESENCE OF ULCERS  *URINARY PROBLEMS  *BOWEL PROBLEMS  UNUSUAL RASH Items with * indicate a potential emergency and should be followed up as soon as possible.  Feel free to call the clinic you have any questions or concerns. The clinic phone number is [phone].  Please show the [name] at check-in to the Emergency Department and triage nurse.

## 2016-05-30 ENCOUNTER — Other Ambulatory Visit: Payer: Self-pay | Admitting: Internal Medicine

## 2016-05-30 ENCOUNTER — Encounter: Payer: Self-pay | Admitting: Internal Medicine

## 2016-05-30 ENCOUNTER — Ambulatory Visit (INDEPENDENT_AMBULATORY_CARE_PROVIDER_SITE_OTHER): Payer: Medicare Other | Admitting: Internal Medicine

## 2016-05-30 VITALS — BP 122/52 | HR 76 | Temp 97.2°F | Resp 16 | Ht 65.5 in | Wt 163.0 lb

## 2016-05-30 DIAGNOSIS — N185 Chronic kidney disease, stage 5: Secondary | ICD-10-CM | POA: Diagnosis not present

## 2016-05-30 DIAGNOSIS — E559 Vitamin D deficiency, unspecified: Secondary | ICD-10-CM | POA: Diagnosis not present

## 2016-05-30 DIAGNOSIS — I951 Orthostatic hypotension: Secondary | ICD-10-CM | POA: Diagnosis not present

## 2016-05-30 DIAGNOSIS — R7303 Prediabetes: Secondary | ICD-10-CM

## 2016-05-30 DIAGNOSIS — E782 Mixed hyperlipidemia: Secondary | ICD-10-CM

## 2016-05-30 DIAGNOSIS — C9000 Multiple myeloma not having achieved remission: Secondary | ICD-10-CM | POA: Diagnosis not present

## 2016-05-30 DIAGNOSIS — Z79899 Other long term (current) drug therapy: Secondary | ICD-10-CM

## 2016-05-30 DIAGNOSIS — R7309 Other abnormal glucose: Secondary | ICD-10-CM | POA: Diagnosis not present

## 2016-05-30 DIAGNOSIS — E1165 Type 2 diabetes mellitus with hyperglycemia: Secondary | ICD-10-CM | POA: Diagnosis not present

## 2016-05-30 DIAGNOSIS — R390 Extravasation of urine: Secondary | ICD-10-CM | POA: Diagnosis not present

## 2016-05-30 DIAGNOSIS — E039 Hypothyroidism, unspecified: Secondary | ICD-10-CM | POA: Diagnosis not present

## 2016-05-30 LAB — MAGNESIUM: Magnesium: 2.2 mg/dL (ref 1.5–2.5)

## 2016-05-30 LAB — LIPID PANEL
CHOLESTEROL: 174 mg/dL (ref <200)
HDL: 50 mg/dL — ABNORMAL LOW (ref >50)
LDL Cholesterol: 90 mg/dL (ref <100)
TRIGLYCERIDES: 170 mg/dL — AB (ref <150)
Total CHOL/HDL Ratio: 3.5 Ratio (ref <5.0)
VLDL: 34 mg/dL — ABNORMAL HIGH (ref <30)

## 2016-05-30 LAB — TSH: TSH: 2.73 mIU/L

## 2016-05-30 NOTE — Patient Instructions (Signed)

## 2016-05-30 NOTE — Progress Notes (Signed)
ADULT & ADOLESCENT INTERNAL MEDICINE [name], M.D.      Uvaldo Bristle. Silverio Lay, P.A.-C [facility]                [address] [place] [address] Telephone [phone] Telefax [phone]   Comprehensive Evaluation &  Examination     This very nice 76 y.o. DWFpresents for a  comprehensive evaluation and management of multiple medical co-morbidities.  Patient has been followed for dysautonomia & othoststic hypotension, Prediabetes, Hyperlipidemia and Vitamin D Deficiency.      Patient has hx/o Multiple Myeloma since 2000 and autologous Stem Cell Transplant in Sept 2010 followed by Dr Earlie Server.  Also, she underwent a Lt partial Mastectomy in 2008 for DCIS. She has CKD5/ESRD (GFR hovering betw 10-20) followed by Dr Marval Regal. More recently she has been followed by Dr Linus Salmons on Diflucan for a Cryptococcal Lung Abscess.      Patient has prior hx/o labile HTN transitioning to Dysautonomia and orthostatic postural hypotension - now stabilized on Midodrine. Patient's BP has been controlled at home and patient denies any cardiac symptoms as chest pain, palpitations, shortness of breath, dizziness or ankle swelling. Today's BP is at goal - 122/52.      Patient's hyperlipidemia is controlled with diet and medications. Patient denies myalgias or other medication SE's. Last lipids were at goal albeit sl elevated Trig's: Lab Results  Component Value Date   CHOL 157 05/27/2015   HDL 39 (L) 05/27/2015   LDLCALC 80 05/27/2015   TRIG 188 (H) 05/27/2015   CHOLHDL 4.0 05/27/2015      Patient has prediabetes (A1c 5.8% in 2012) and patient denies reactive hypoglycemic symptoms, visual blurring, diabetic polys, or paresthesias. Last A1c was at goal: Lab Results  Component Value Date   HGBA1C 5.0 02/20/2016      Patient haas been on Thyroid replacement since the 1980's. Finally, patient has history of Vitamin D Deficiency of "47" on  treatment in 2012 and last Vitamin D was at goal: Lab Results  Component Value Date   VD25OH 25 02/20/2016   Current Outpatient Prescriptions on File Prior to Visit  Medication Sig  . acetaminophen (TYLENOL) 325 MG tablet Take 650 mg by mouth every 6 (six) hours as needed for mild pain or moderate pain.   Marland Kitchen albuterol (PROVENTIL HFA;VENTOLIN HFA) 108 (90 Base) MCG/ACT inhaler Inhale 1-2 puffs into the lungs every 6 (six) hours as needed for wheezing or shortness of breath (cough).  Marland Kitchen buPROPion (WELLBUTRIN XL) 300 MG 24 hr tablet TAKE 1 TABLET BY MOUTH EACH MORNING FOR MOOD AND DEPRESSION.  . butalbital-acetaminophen-caffeine (FIORICET, ESGIC) 50-325-40 MG tablet TAKE 1 OR 2 TABLETS BY MOUTH EVERY 4 HOURS AS NEEDED FOR PAIN OR HEADACHE.  Marland Kitchen Cholecalciferol 4000 UNITS TABS Take 4,000 Units by mouth daily.  . citalopram (CELEXA) 40 MG tablet TAKE 1 TABLET BY MOUTH DAILY OR AS DIRECTED  . cromolyn (OPTICROM) 4 % ophthalmic solution Place 1 drop into both eyes 4 (four) times daily.  . fluconazole (DIFLUCAN) 200 MG tablet Take 1 tablet (200 mg total) by mouth daily.  Marland Kitchen gabapentin (NEURONTIN) 300 MG capsule TAKE 1 CAPSULE BY MOUTH AT BEDTIME.  . isosorbide mononitrate (IMDUR) 30 MG 24 hr tablet Take 1 tablet (30 mg total) by mouth daily.  Marland Kitchen levothyroxine (SYNTHROID, LEVOTHROID) 150 MCG tablet Take 1 tablet (150 mcg total) by mouth daily. (Patient  taking differently: Take 150 mcg by mouth daily. )  . loperamide (IMODIUM) 2 MG capsule Take 1 capsule (2 mg total) by mouth as needed for diarrhea or loose stools.  Marland Kitchen LORazepam (ATIVAN) 0.5 MG tablet Take 1 tablet (0.5 mg total) by mouth every 8 (eight) hours as needed (nausea).  . loteprednol (ALREX) 0.2 % SUSP Place 1 drop into both eyes 4 (four) times daily.  . magic mouthwash SOLN Take 5 mLs by mouth 4 (four) times daily as needed for mouth pain.  . meloxicam (MOBIC) 15 MG tablet Take 1 tablet (15 mg total) by mouth daily.  . midodrine (PROAMATINE) 10 MG  tablet TAKE 1 TABLET BY MOUTH 3 TIMES DAILY.  . montelukast (SINGULAIR) 10 MG tablet TAKE 1 TABLET BY MOUTH DAILY.  . nitroGLYCERIN (NITROSTAT) 0.4 MG SL tablet Place 0.4 mg under the tongue every 5 (five) minutes as needed for chest pain. Reported on 03/30/2015  . nystatin (MYCOSTATIN/NYSTOP) powder Apply topically 2 (two) times daily.  Marland Kitchen PRESCRIPTION MEDICATION Pt gets chemo at [facility].  . ranitidine (ZANTAC) 300 MG tablet Take 1 to 2 tablets daily for heartburn & reflux to allow wean and transition from PPI / pantoprazole (Patient taking differently: Take 300 mg by mouth at bedtime. Take 1 to 2 tablets daily for heartburn & reflux to allow wean and transition from PPI / pantoprazole)  . senna (SENOKOT) 8.6 MG tablet Take 1 tablet by mouth at bedtime.  . traMADol (ULTRAM) 50 MG tablet Take 1 to 2 tablets by mouth 4 times a day as needed for pain.  . vitamin C (ASCORBIC ACID) 250 MG tablet Take 250 mg by mouth daily.  Marland Kitchen azelastine (ASTELIN) 0.1 % nasal spray Place 2 sprays into both nostrils 2 (two) times daily. Use in each nostril as directed  . ferrous sulfate 325 (65 FE) MG tablet Take 1 tablet daily or as directed   No current facility-administered medications on file prior to visit.    Allergies  Allergen Reactions  . Codeine Anaphylaxis  . Latex Shortness Of Breath    Adhesive products   . Other Other (See Comments)    Onion, chocolate causes migraines  . Onion Other (See Comments)    Causes migraine headaches  . Zyprexa [Olanzapine] Other (See Comments)    Confusion , dizzy,unsteady  . Adhesive [Tape] Other (See Comments)    blisters  . Hydrocodone Rash    With extreme itching  . Iodinated Diagnostic Agents Hives, Itching and Rash    Happened 60 years ago Stage IV kidney function  . Sulfa Antibiotics Itching   Past Medical History:  Diagnosis Date  . Anemia   . Anginal pain ([place])    used NTG x 2 May 31 and 06/15/13   . Anxiety   . Arthritis   . B12 deficiency 12/04/2014  .  Breast cancer ([place])   . CKD (chronic kidney disease) stage 3, GFR 30-59 ml/min   . Complication of anesthesia   . COPD (chronic obstructive pulmonary disease) ([place])   . Cryptococcal pneumonitis ([place]) 11/22/2015  . Depression   . Dizziness   . Dyspnea   . Fibromyalgia   . Fibromyalgia   . GERD (gastroesophageal reflux disease)   . Headache(784.0)   . Heart murmur   . Hemoptysis 10/21/2015  . History of blood transfusion    last one May 12   . Hx of cardiovascular stress test    LexiScan with low level exercise Myoview (02/2013): No ischemia, EF  72%; normal study  . Hx of echocardiogram    a.  Echocardiogram (12/26/2012): EF 16-10%, grade 1 diastolic dysfunction;   b.  Echocardiogram (02/2013): EF 55-60%, no WMA, trivial effusion  . Hyperkalemia   . Hyponatremia   . Hypothyroidism   . Mucositis   . Multiple myeloma   . Myocardial infarction [facility])    in past, patient was unaware.   . Neuropathy   . Pain in joint, pelvic region and thigh 07/07/2015  . PONV (postoperative nausea and vomiting) 2008   after [facility] Maintenance  Topic Date Due  . COLONOSCOPY  09/23/1991  . INFLUENZA VACCINE  08/15/2016  . MAMMOGRAM  04/28/2018  . TETANUS/TDAP  10/15/2019  . DEXA SCAN  Completed  . PNA vac Low Risk Adult  Completed   Immunization History  Administered Date(s) Administered  . Influenza, High Dose Seasonal PF 09/30/2013  . Influenza,inj,Quad PF,36+ Mos 09/29/2015  . Influenza-Unspecified 11/05/2012, 11/03/2014  . Pneumococcal Conjugate-13 10/05/2013  . Pneumococcal-Unspecified 03/16/2011  . Tdap 10/14/2009   Past Surgical History:  Procedure Laterality Date  . ABDOMINAL HYSTERECTOMY  1981  . AV FISTULA PLACEMENT Left 06/19/2013   Procedure: CREATION OF LEFT ARM ARTERIOVENOUS (AV) FISTULA ;  Surgeon: Angelia Mould, MD;  Location: [place];  Service: Vascular;  Laterality: Left;  . BREAST RECONSTRUCTION    . BREAST SURGERY    . CATARACT EXTRACTION, BILATERAL     . CHOLECYSTECTOMY  1971  . EYE SURGERY Bilateral    lens implant  . history of Port removal    . LUNG BIOPSY  10/21/2015  . MASTECTOMY Left 2008  . PORTACATH PLACEMENT  12/2012   has had 2  . Status post stem cell transplant on September 28, 2008.     Family History  Problem Relation Age of Onset  . Arthritis Mother   . Asthma Mother   . Cancer Sister   . Hyperlipidemia Brother    Social History  Substance Use Topics  . Smoking status: Former Smoker    Packs/day: 1.00    Years: 30.00    Types: Cigarettes    Quit date: 02/15/2005  . Smokeless tobacco: Never Used  . Alcohol use No    ROS Constitutional: Denies fever, chills, weight loss/gain, headaches, insomnia,  night sweats, and change in appetite. Does c/o fatigue. Eyes: Denies redness, blurred vision, diplopia, discharge, itchy, watery eyes.  ENT: Denies discharge, congestion, post nasal drip, epistaxis, sore throat, earache, hearing loss, dental pain, Tinnitus, Vertigo, Sinus pain, snoring.  Cardio: Denies chest pain, palpitations, irregular heartbeat, syncope, dyspnea, diaphoresis, orthopnea, PND, claudication, edema Respiratory: denies cough, dyspnea, DOE, pleurisy, hoarseness, laryngitis, wheezing.  Gastrointestinal: Denies dysphagia, heartburn, reflux, water brash, pain, cramps, nausea, vomiting, bloating, diarrhea, constipation, hematemesis, melena, hematochezia, jaundice, hemorrhoids Genitourinary: Denies dysuria, frequency, urgency, nocturia, hesitancy, discharge, hematuria, flank pain Breast: Breast lumps, nipple discharge, bleeding.  Musculoskeletal: Denies arthralgia, myalgia, stiffness, Jt. Swelling, pain, limp, and strain/sprain. Denies falls. Skin: Denies puritis, rash, hives, warts, acne, eczema, changing in skin lesion Neuro: No weakness, tremor, incoordination, spasms, paresthesia, pain Psychiatric: Denies confusion, memory loss, sensory loss. Denies Depression. Endocrine: Denies change in weight, skin,  hair change, nocturia, and paresthesia, diabetic polys, visual blurring, hyper / hypo glycemic episodes.  Heme/Lymph: No excessive bleeding, bruising, enlarged lymph nodes.  Physical Exam  BP (!) 122/52   Pulse 76   Temp 97.2 F (36.2 C)   Resp 16   Ht 5' 5.5" (1.664 m)   [employer]  163 lb (73.9 kg)   BMI 26.71 kg/m   General Appearance: Well nourished, well groomed and in no apparent distress.  Eyes: PERRLA, EOMs, conjunctiva no swelling or erythema, normal fundi and vessels. Sinuses: No frontal/maxillary tenderness ENT/Mouth: EACs patent / TMs  nl. Nares clear without erythema, swelling, mucoid exudates. Oral hygiene is good. No erythema, swelling, or exudate. Tongue normal, non-obstructing. Tonsils not swollen or erythematous. Hearing normal.  Neck: Supple, thyroid normal. No bruits, nodes or JVD. Respiratory: Respiratory effort normal.  BS equal and clear bilateral without rales, rhonci, wheezing or stridor. Cardio: Heart sounds are normal with regular rate and rhythm and no murmurs, rubs or gallops. Peripheral pulses are normal and equal bilaterally without edema. No aortic or femoral bruits. Chest: symmetric with normal excursions and percussion. Breasts: Symmetric, without lumps, nipple discharge, retractions, or fibrocystic changes.  Abdomen: Flat, soft with bowel sounds active. Nontender, no guarding, rebound, hernias, masses, or organomegaly.  Lymphatics: Non tender without lymphadenopathy.  Genitourinary:  Musculoskeletal: Full ROM all peripheral extremities, joint stability, 5/5 strength, and normal gait. Skin: Warm and dry without rashes, lesions, cyanosis, clubbing or  ecchymosis.  Neuro: Cranial nerves intact, reflexes equal bilaterally. Normal muscle tone, no cerebellar symptoms. Sensation intact.  Pysch: Alert and oriented X 3, normal affect, Insight and Judgment appropriate.   Assessment and Plan  1. Orthostatic hypotension  - TSH  2. Hyperlipidemia, mixed  - Lipid  panel - TSH  3. Prediabetes  - Hemoglobin A1c - Insulin, random  4. Vitamin D deficiency  - VITAMIN D 25 Hydroxy  5. CKD (chronic kidney disease) stage 5, GFR less than 15 ml/min (HCC)   6. Multiple myeloma (HCC)   7. Medication management  - Magnesium - Lipid panel - TSH - Hemoglobin A1c - Insulin, random - VITAMIN D 25 Hydroxy        Patient was counseled in prudent diet to achieve/maintain BMI less than 25 for weight control, BP monitoring, regular exercise and medications. Discussed med's effects and SE's. Screening labs and tests as requested with regular follow-up as recommended. Over 40 minutes of exam, counseling, chart review and high complex critical decision making was performed.

## 2016-05-31 LAB — URINALYSIS W MICROSCOPIC + REFLEX CULTURE
Bacteria, UA: NONE SEEN [HPF]
Bilirubin Urine: NEGATIVE
CASTS: NONE SEEN [LPF]
Crystals: NONE SEEN [HPF]
Glucose, UA: NEGATIVE
Hgb urine dipstick: NEGATIVE
Ketones, ur: NEGATIVE
Leukocytes, UA: NEGATIVE
NITRITE: NEGATIVE
PH: 6 (ref 5.0–8.0)
RBC / HPF: NONE SEEN RBC/HPF (ref <=2)
SPECIFIC GRAVITY, URINE: 1.009 (ref 1.001–1.035)
Squamous Epithelial / LPF: NONE SEEN [HPF] (ref <=5)
WBC, UA: NONE SEEN WBC/HPF (ref <=5)
Yeast: NONE SEEN [HPF]

## 2016-05-31 LAB — CBC WITH DIFFERENTIAL/PLATELET
Basophils Absolute: 0 {cells}/uL (ref 0–200)
Basophils Relative: 0 %
Eosinophils Absolute: 40 {cells}/uL (ref 15–500)
Eosinophils Relative: 1 %
HCT: 25.9 % — ABNORMAL LOW (ref 35.0–45.0)
Hemoglobin: 8.6 g/dL — ABNORMAL LOW (ref 11.7–15.5)
Lymphocytes Relative: 14 %
Lymphs Abs: 560 {cells}/uL — ABNORMAL LOW (ref 850–3900)
MCH: 36.1 pg — ABNORMAL HIGH (ref 27.0–33.0)
MCHC: 33.2 g/dL (ref 32.0–36.0)
MCV: 108.8 fL — ABNORMAL HIGH (ref 80.0–100.0)
MPV: 10.1 fL (ref 7.5–12.5)
Monocytes Absolute: 800 {cells}/uL (ref 200–950)
Monocytes Relative: 20 %
Neutro Abs: 2600 {cells}/uL (ref 1500–7800)
Neutrophils Relative %: 65 %
Platelets: 46 K/uL — ABNORMAL LOW (ref 140–400)
RBC: 2.38 MIL/uL — ABNORMAL LOW (ref 3.80–5.10)
RDW: 15.9 % — ABNORMAL HIGH (ref 11.0–15.0)
WBC: 4 K/uL (ref 3.8–10.8)

## 2016-05-31 LAB — HEMOGLOBIN A1C
Hgb A1c MFr Bld: 5 %
Mean Plasma Glucose: 97 mg/dL

## 2016-05-31 LAB — VITAMIN B12: Vitamin B-12: 133 pg/mL — ABNORMAL LOW (ref 200–1100)

## 2016-06-04 ENCOUNTER — Other Ambulatory Visit (HOSPITAL_BASED_OUTPATIENT_CLINIC_OR_DEPARTMENT_OTHER): Payer: Medicare Other

## 2016-06-04 ENCOUNTER — Ambulatory Visit: Payer: Medicare Other | Admitting: Oncology

## 2016-06-04 ENCOUNTER — Ambulatory Visit (HOSPITAL_BASED_OUTPATIENT_CLINIC_OR_DEPARTMENT_OTHER): Payer: Self-pay

## 2016-06-04 DIAGNOSIS — C9000 Multiple myeloma not having achieved remission: Secondary | ICD-10-CM

## 2016-06-04 LAB — COMPREHENSIVE METABOLIC PANEL
ALBUMIN: 3.9 g/dL (ref 3.5–5.0)
ALK PHOS: 139 U/L (ref 40–150)
ALT: 58 U/L — AB (ref 0–55)
AST: 46 U/L — AB (ref 5–34)
Anion Gap: 12 mEq/L — ABNORMAL HIGH (ref 3–11)
BILIRUBIN TOTAL: 0.5 mg/dL (ref 0.20–1.20)
BUN: 44.6 mg/dL — AB (ref 7.0–26.0)
CO2: 23 meq/L (ref 22–29)
CREATININE: 2.9 mg/dL — AB (ref 0.6–1.1)
Calcium: 9.5 mg/dL (ref 8.4–10.4)
Chloride: 107 mEq/L (ref 98–109)
EGFR: 15 mL/min/{1.73_m2} — ABNORMAL LOW (ref >90)
GLUCOSE: 104 mg/dL (ref 70–140)
Potassium: 4.3 mEq/L (ref 3.5–5.1)
SODIUM: 143 meq/L (ref 136–145)
TOTAL PROTEIN: 6.3 g/dL — AB (ref 6.4–8.3)

## 2016-06-04 LAB — CBC WITH DIFFERENTIAL/PLATELET
BASO%: 0.5 % (ref 0.0–2.0)
Basophils Absolute: 0 10*3/uL (ref 0.0–0.1)
EOS ABS: 0 10*3/uL (ref 0.0–0.5)
EOS%: 0.5 % (ref 0.0–7.0)
HCT: 27.8 % — ABNORMAL LOW (ref 34.8–46.6)
HEMOGLOBIN: 9.4 g/dL — AB (ref 11.6–15.9)
LYMPH%: 24.6 % (ref 14.0–49.7)
MCH: 36.7 pg — ABNORMAL HIGH (ref 25.1–34.0)
MCHC: 33.8 g/dL (ref 31.5–36.0)
MCV: 108.6 fL — AB (ref 79.5–101.0)
MONO#: 0.7 10*3/uL (ref 0.1–0.9)
MONO%: 14.3 % — ABNORMAL HIGH (ref 0.0–14.0)
NEUT%: 60.1 % (ref 38.4–76.8)
NEUTROS ABS: 3 10*3/uL (ref 1.5–6.5)
Platelets: 65 10*3/uL — ABNORMAL LOW (ref 145–400)
RBC: 2.56 10*6/uL — AB (ref 3.70–5.45)
RDW: 15.3 % — AB (ref 11.2–14.5)
WBC: 5 10*3/uL (ref 3.9–10.3)
lymph#: 1.2 10*3/uL (ref 0.9–3.3)

## 2016-06-04 NOTE — Progress Notes (Signed)
Hold treatment today due to thrombocytopenia per Dr. Julien Nordmann.  Patient is to return next week for treatment. Patient verbalized understanding.

## 2016-06-04 NOTE — Patient Instructions (Signed)
Thrombocytopenia Thrombocytopenia means that you have a low number of platelets in your blood. Platelets are tiny cells in the blood. When you bleed, they clump together at the cut or injury to stop the bleeding. This is called blood clotting. Not having enough platelets can cause bleeding problems. Follow these instructions at home: General instructions   Check your skin and inside your mouth for bruises or blood as told by your doctor.  Check to see if there is blood in your spit (sputum), pee (urine), and poop (stool). Do this as told by your doctor.  Ask your doctor if you can drink alcohol.  Take over-the-counter and prescription medicines only as told by your doctor.  Tell all of your doctors that you have this condition. Be sure to tell your dentist and eye doctor too. Activity   Do not do activities that can cause bumps or bruises until your doctor says it is okay.  Be careful not to cut yourself:  When you shave.  When you use scissors, needles, knives, or other tools.  Be careful not to burn yourself:  When you use an iron.  When you cook. Contact a doctor if:  You have bruises and you do not know why. Get help right away if:  You are bleeding anywhere on your body.  You have blood in your spit, pee, or poop. This information is not intended to replace advice given to you by your health care provider. Make sure you discuss any questions you have with your health care provider. Document Released: 12/21/2010 Document Revised: 09/04/2015 Document Reviewed: 07/05/2014 Elsevier Interactive Patient Education  2017 [employer].

## 2016-06-05 ENCOUNTER — Ambulatory Visit: Payer: Medicare Other

## 2016-06-06 ENCOUNTER — Encounter: Payer: Self-pay | Admitting: Internal Medicine

## 2016-06-07 ENCOUNTER — Encounter: Payer: Self-pay | Admitting: Internal Medicine

## 2016-06-12 ENCOUNTER — Other Ambulatory Visit (HOSPITAL_BASED_OUTPATIENT_CLINIC_OR_DEPARTMENT_OTHER): Payer: Medicare Other

## 2016-06-12 ENCOUNTER — Ambulatory Visit (HOSPITAL_BASED_OUTPATIENT_CLINIC_OR_DEPARTMENT_OTHER): Payer: Medicare Other

## 2016-06-12 DIAGNOSIS — Z5112 Encounter for antineoplastic immunotherapy: Secondary | ICD-10-CM

## 2016-06-12 DIAGNOSIS — Z5111 Encounter for antineoplastic chemotherapy: Secondary | ICD-10-CM | POA: Diagnosis not present

## 2016-06-12 DIAGNOSIS — N185 Chronic kidney disease, stage 5: Secondary | ICD-10-CM | POA: Diagnosis not present

## 2016-06-12 DIAGNOSIS — C9000 Multiple myeloma not having achieved remission: Secondary | ICD-10-CM

## 2016-06-12 LAB — COMPREHENSIVE METABOLIC PANEL
ALK PHOS: 150 U/L (ref 40–150)
ALT: 36 U/L (ref 0–55)
AST: 22 U/L (ref 5–34)
Albumin: 3.8 g/dL (ref 3.5–5.0)
Anion Gap: 10 mEq/L (ref 3–11)
BILIRUBIN TOTAL: 0.62 mg/dL (ref 0.20–1.20)
BUN: 39.4 mg/dL — AB (ref 7.0–26.0)
CALCIUM: 9.1 mg/dL (ref 8.4–10.4)
CO2: 21 mEq/L — ABNORMAL LOW (ref 22–29)
CREATININE: 2.5 mg/dL — AB (ref 0.6–1.1)
Chloride: 110 mEq/L — ABNORMAL HIGH (ref 98–109)
EGFR: 18 mL/min/{1.73_m2} — ABNORMAL LOW (ref >90)
GLUCOSE: 130 mg/dL (ref 70–140)
POTASSIUM: 4.4 meq/L (ref 3.5–5.1)
SODIUM: 142 meq/L (ref 136–145)
TOTAL PROTEIN: 5.9 g/dL — AB (ref 6.4–8.3)

## 2016-06-12 LAB — CBC WITH DIFFERENTIAL/PLATELET
BASO%: 0.5 % (ref 0.0–2.0)
BASOS ABS: 0 10*3/uL (ref 0.0–0.1)
EOS%: 0.7 % (ref 0.0–7.0)
Eosinophils Absolute: 0 10*3/uL (ref 0.0–0.5)
HEMATOCRIT: 28.8 % — AB (ref 34.8–46.6)
HEMOGLOBIN: 9.6 g/dL — AB (ref 11.6–15.9)
LYMPH#: 0.7 10*3/uL — AB (ref 0.9–3.3)
LYMPH%: 19.7 % (ref 14.0–49.7)
MCH: 37.4 pg — AB (ref 25.1–34.0)
MCHC: 33.4 g/dL (ref 31.5–36.0)
MCV: 111.8 fL — ABNORMAL HIGH (ref 79.5–101.0)
MONO#: 0.3 10*3/uL (ref 0.1–0.9)
MONO%: 8.7 % (ref 0.0–14.0)
NEUT#: 2.3 10*3/uL (ref 1.5–6.5)
NEUT%: 70.4 % (ref 38.4–76.8)
Platelets: 98 10*3/uL — ABNORMAL LOW (ref 145–400)
RBC: 2.58 10*6/uL — ABNORMAL LOW (ref 3.70–5.45)
RDW: 16 % — AB (ref 11.2–14.5)
WBC: 3.3 10*3/uL — AB (ref 3.9–10.3)

## 2016-06-12 LAB — LACTATE DEHYDROGENASE: LDH: 165 U/L (ref 125–245)

## 2016-06-12 MED ORDER — SODIUM CHLORIDE 0.9 % IV SOLN
Freq: Once | INTRAVENOUS | Status: AC
Start: 2016-06-12 — End: 2016-06-12
  Administered 2016-06-12: 10:00:00 via INTRAVENOUS

## 2016-06-12 MED ORDER — CYCLOPHOSPHAMIDE CHEMO INJECTION 1 GM
225.0000 mg/m2 | Freq: Once | INTRAMUSCULAR | Status: AC
  Administered 2016-06-12: 420 mg via INTRAVENOUS
  Filled 2016-06-12: qty 21

## 2016-06-12 MED ORDER — SODIUM CHLORIDE 0.9 % IJ SOLN
10.0000 mL | INTRAMUSCULAR | Status: DC | PRN
  Filled 2016-06-12: qty 10

## 2016-06-12 MED ORDER — ONDANSETRON HCL 8 MG PO TABS
ORAL_TABLET | ORAL | Status: AC
  Filled 2016-06-12: qty 1

## 2016-06-12 MED ORDER — SODIUM CHLORIDE 0.9 % IJ SOLN
10.0000 mL | INTRAMUSCULAR | Status: DC | PRN
  Administered 2016-06-12: 10 mL
  Filled 2016-06-12: qty 10

## 2016-06-12 MED ORDER — DEXAMETHASONE SODIUM PHOSPHATE 10 MG/ML IJ SOLN
INTRAMUSCULAR | Status: AC
  Filled 2016-06-12: qty 1

## 2016-06-12 MED ORDER — DEXAMETHASONE SODIUM PHOSPHATE 10 MG/ML IJ SOLN: 10.0000 mg | Freq: Once | INTRAMUSCULAR | Status: DC

## 2016-06-12 MED ORDER — SODIUM CHLORIDE 0.9 % IV SOLN: 225.0000 mg/m2 | Freq: Once | INTRAVENOUS | Status: DC

## 2016-06-12 MED ORDER — ONDANSETRON HCL 8 MG PO TABS
8.0000 mg | ORAL_TABLET | Freq: Once | ORAL | Status: AC
  Administered 2016-06-12: 8 mg via ORAL

## 2016-06-12 MED ORDER — CARFILZOMIB CHEMO INJECTION 60 MG: 27.0000 mg/m2 | Freq: Once | INTRAVENOUS | Status: DC

## 2016-06-12 MED ORDER — HEPARIN SOD (PORK) LOCK FLUSH 100 UNIT/ML IV SOLN
500.0000 [IU] | Freq: Once | INTRAVENOUS | Status: DC | PRN
  Filled 2016-06-12: qty 5

## 2016-06-12 MED ORDER — DEXAMETHASONE SODIUM PHOSPHATE 10 MG/ML IJ SOLN
10.0000 mg | Freq: Once | INTRAMUSCULAR | Status: AC
Start: 2016-06-12 — End: 2016-06-12
  Administered 2016-06-12: 10 mg via INTRAVENOUS

## 2016-06-12 MED ORDER — SODIUM CHLORIDE 0.9 % IV SOLN: Freq: Once | INTRAVENOUS | Status: DC

## 2016-06-12 MED ORDER — HEPARIN SOD (PORK) LOCK FLUSH 100 UNIT/ML IV SOLN
500.0000 [IU] | Freq: Once | INTRAVENOUS | Status: AC | PRN
  Administered 2016-06-12: 500 [IU]
  Filled 2016-06-12: qty 5

## 2016-06-12 MED ORDER — SODIUM CHLORIDE 0.9 % IV SOLN: Freq: Once | INTRAVENOUS | Status: AC

## 2016-06-12 MED ORDER — SODIUM CHLORIDE 0.9 % IV SOLN
Freq: Once | INTRAVENOUS | Status: AC
Start: 2016-06-12 — End: 2016-06-12
  Administered 2016-06-12: 11:00:00 via INTRAVENOUS

## 2016-06-12 MED ORDER — ONDANSETRON HCL 8 MG PO TABS: 8.0000 mg | ORAL_TABLET | Freq: Once | ORAL | Status: DC

## 2016-06-12 MED ORDER — DEXTROSE 5 % IV SOLN
27.0000 mg/m2 | Freq: Once | INTRAVENOUS | Status: AC
  Administered 2016-06-12: 50 mg via INTRAVENOUS
  Filled 2016-06-12: qty 25

## 2016-06-12 NOTE — Patient Instructions (Signed)
[facility] Discharge Instructions for Patients Receiving Chemotherapy  Today you received the following chemotherapy agents:  carfilzomib (Kyprolis)   To help prevent nausea and vomiting after your treatment, we encourage you to take your nausea medication as ordered by your doctor.   If you develop nausea and vomiting that is not controlled by your nausea medication, call the clinic.   BELOW ARE SYMPTOMS THAT SHOULD BE REPORTED IMMEDIATELY:  *FEVER GREATER THAN 100.5 F  *CHILLS WITH OR WITHOUT FEVER  NAUSEA AND VOMITING THAT IS NOT CONTROLLED WITH YOUR NAUSEA MEDICATION  *UNUSUAL SHORTNESS OF BREATH  *UNUSUAL BRUISING OR BLEEDING  TENDERNESS IN MOUTH AND THROAT WITH OR WITHOUT PRESENCE OF ULCERS  *URINARY PROBLEMS  *BOWEL PROBLEMS  UNUSUAL RASH Items with * indicate a potential emergency and should be followed up as soon as possible.  Feel free to call the clinic you have any questions or concerns. The clinic phone number is [phone].  Please show the [place] at check-in to the Emergency Department and triage nurse.

## 2016-06-12 NOTE — Progress Notes (Signed)
Dr. Julien Nordmann okay to tx today with plt 18 and ctn 2.5.

## 2016-06-13 ENCOUNTER — Ambulatory Visit: Payer: Medicare Other

## 2016-06-13 LAB — KAPPA/LAMBDA LIGHT CHAINS
Ig Kappa Free Light Chain: 17.7 mg/L (ref 3.3–19.4)
Ig Lambda Free Light Chain: 5.2 mg/L — ABNORMAL LOW (ref 5.7–26.3)
Kappa/Lambda FluidC Ratio: 3.4 — ABNORMAL HIGH (ref 0.26–1.65)

## 2016-06-13 LAB — IGG, IGA, IGM
IGG (IMMUNOGLOBIN G), SERUM: 284 mg/dL — AB (ref 700–1600)
IGM (IMMUNOGLOBIN M), SRM: 6 mg/dL — AB (ref 26–217)
IgA, Qn, Serum: <5 mg/dL — ABNORMAL LOW (ref 64–422)

## 2016-06-13 LAB — BETA 2 MICROGLOBULIN, SERUM: BETA 2: 7 mg/L — AB (ref 0.6–2.4)

## 2016-06-13 NOTE — Progress Notes (Signed)
Pt refused tx today due to [facility] coming back for her at 10:30 for another appointment she has to go to. Pt stated she waited 1 hr for registration and therefore does not have time to receive treatment as she is dependent on transportation services.

## 2016-06-13 NOTE — Progress Notes (Signed)
Notified [employer] engineer of situation with [facility], and pt not having enough time for tx today due to registration delays.

## 2016-06-18 ENCOUNTER — Other Ambulatory Visit: Payer: Medicare Other

## 2016-06-18 ENCOUNTER — Ambulatory Visit: Payer: Medicare Other

## 2016-06-18 ENCOUNTER — Ambulatory Visit (INDEPENDENT_AMBULATORY_CARE_PROVIDER_SITE_OTHER): Payer: Medicare Other | Admitting: Internal Medicine

## 2016-06-18 ENCOUNTER — Encounter: Payer: Self-pay | Admitting: Internal Medicine

## 2016-06-18 VITALS — BP 128/60 | HR 76 | Temp 97.5°F | Resp 16 | Ht 65.5 in | Wt 155.2 lb

## 2016-06-18 DIAGNOSIS — H6591 Unspecified nonsuppurative otitis media, right ear: Secondary | ICD-10-CM

## 2016-06-18 DIAGNOSIS — M26621 Arthralgia of right temporomandibular joint: Secondary | ICD-10-CM | POA: Diagnosis not present

## 2016-06-18 MED ORDER — PREDNISONE 20 MG PO TABS: ORAL_TABLET | ORAL | 0 refills | Status: DC

## 2016-06-18 NOTE — Progress Notes (Signed)
Subjective:    Patient ID: Nancy Norris, female    DOB: 07-09-1940, 76 y.o.   MRN: [id number]  HPI   This nice 76 yo DWF with multiple co-morbidities presents with c/o Rt ear pain & dizziness x 2-3 days. Denies definite vertigo, N/V or focal neuro sn's/sx's.   Medication Sig  . acetaminophen (TYLENOL) 325 MG tablet Take 650 mg by mouth every 6 (six) hours as needed for mild pain or moderate pain.   Marland Kitchen albuterol (PROVENTIL HFA;VENTOLIN HFA) 108 (90 Base) MCG/ACT inhaler Inhale 1-2 puffs into the lungs every 6 (six) hours as needed for wheezing or shortness of breath (cough).  Marland Kitchen buPROPion (WELLBUTRIN XL) 300 MG 24 hr tablet TAKE 1 TABLET BY MOUTH EACH MORNING FOR MOOD AND DEPRESSION.  . butalbital-acetaminophen-caffeine (FIORICET, ESGIC) 50-325-40 MG tablet TAKE 1 OR 2 TABLETS BY MOUTH EVERY 4 HOURS AS NEEDED FOR PAIN OR HEADACHE.  Marland Kitchen Cholecalciferol 4000 UNITS TABS Take 4,000 Units by mouth daily.  . citalopram (CELEXA) 40 MG tablet TAKE 1 TABLET BY MOUTH DAILY OR AS DIRECTED  . cromolyn (OPTICROM) 4 % ophthalmic solution Place 1 drop into both eyes 4 (four) times daily.  . fluconazole (DIFLUCAN) 200 MG tablet Take 1 tablet (200 mg total) by mouth daily.  Marland Kitchen gabapentin (NEURONTIN) 300 MG capsule TAKE 1 CAPSULE BY MOUTH AT BEDTIME.  . isosorbide mononitrate (IMDUR) 30 MG 24 hr tablet Take 1 tablet (30 mg total) by mouth daily.  Marland Kitchen levothyroxine (SYNTHROID, LEVOTHROID) 150 MCG tablet Take 1 tablet (150 mcg total) by mouth daily. (Patient taking differently: Take 150 mcg by mouth daily. )  . loperamide (IMODIUM) 2 MG capsule Take 1 capsule (2 mg total) by mouth as needed for diarrhea or loose stools.  Marland Kitchen LORazepam (ATIVAN) 0.5 MG tablet Take 1 tablet (0.5 mg total) by mouth every 8 (eight) hours as needed (nausea).  . loteprednol (ALREX) 0.2 % SUSP Place 1 drop into both eyes 4 (four) times daily.  . magic mouthwash SOLN Take 5 mLs by mouth 4 (four) times daily as needed for mouth pain.  . meloxicam  (MOBIC) 15 MG tablet Take 1 tablet (15 mg total) by mouth daily.  . midodrine (PROAMATINE) 10 MG tablet TAKE 1 TABLET BY MOUTH 3 TIMES DAILY.  . montelukast (SINGULAIR) 10 MG tablet TAKE 1 TABLET BY MOUTH DAILY.  . nitroGLYCERIN (NITROSTAT) 0.4 MG SL tablet Place 0.4 mg under the tongue every 5 (five) minutes as needed for chest pain. Reported on 03/30/2015  . nystatin (MYCOSTATIN/NYSTOP) powder Apply topically 2 (two) times daily.  Marland Kitchen PRESCRIPTION MEDICATION Pt gets [facility] at [facility].  . ranitidine (ZANTAC) 300 MG tablet Take 1 to 2 tablets daily for heartburn & reflux to allow wean and transition from PPI / pantoprazole (Patient taking differently: Take 300 mg by mouth at bedtime. Take 1 to 2 tablets daily for heartburn & reflux to allow wean and transition from PPI / pantoprazole)  . senna (SENOKOT) 8.6 MG tablet Take 1 tablet by mouth at bedtime.  . traMADol (ULTRAM) 50 MG tablet Take 1 to 2 tablets by mouth 4 times a day as needed for pain.  . vitamin C (ASCORBIC ACID) 250 MG tablet Take 250 mg by mouth daily.  Marland Kitchen azelastine (ASTELIN) 0.1 % nasal spray Place 2 sprays into both nostrils 2 (two) times daily. Use in each nostril as directed  . ferrous sulfate 325 (65 FE) MG tablet Take 1 tablet daily or as directed   No facility-administered medications  prior to visit.    Allergies  Allergen Reactions  . Codeine Anaphylaxis  . Latex Shortness Of Breath    Adhesive products   . Other Other (See Comments)    Onion, chocolate causes migraines  . Onion Other (See Comments)    Causes migraine headaches  . Zyprexa [Olanzapine] Other (See Comments)    Confusion , dizzy,unsteady  . Adhesive [Tape] Other (See Comments)    blisters  . Hydrocodone Rash    With extreme itching  . Iodinated Diagnostic Agents Hives, Itching and Rash    Happened 60 years ago Stage IV kidney function  . Sulfa Antibiotics Itching   Past Medical History:  Diagnosis Date  . Anemia   . Anginal pain ([place])    used NTG x 2  May 31 and 06/15/13   . Anxiety   . Arthritis   . B12 deficiency 12/04/2014  . Breast cancer ([place])   . CKD (chronic kidney disease) stage 3, GFR 30-59 ml/min   . Complication of anesthesia   . COPD (chronic obstructive pulmonary disease) ([place])   . Cryptococcal pneumonitis ([place]) 11/22/2015  . Depression   . Dizziness   . Dyspnea   . Fibromyalgia   . Fibromyalgia   . GERD (gastroesophageal reflux disease)   . Headache(784.0)   . Heart murmur   . Hemoptysis 10/21/2015  . History of blood transfusion    last one May 12   . Hx of cardiovascular stress test    LexiScan with low level exercise Myoview (02/2013): No ischemia, EF 72%; normal study  . Hx of echocardiogram    a.  Echocardiogram (12/26/2012): EF 11-57%, grade 1 diastolic dysfunction;   b.  Echocardiogram (02/2013): EF 55-60%, no WMA, trivial effusion  . Hyperkalemia   . Hyponatremia   . Hypothyroidism   . Mucositis   . Multiple myeloma   . Myocardial infarction [facility])    in past, patient was unaware.   . Neuropathy   . Pain in joint, pelvic region and thigh 07/07/2015  . PONV (postoperative nausea and vomiting) 2008   after mastestomy   Past Surgical History:  Procedure Laterality Date  . ABDOMINAL HYSTERECTOMY  1981  . AV FISTULA PLACEMENT Left 06/19/2013   Procedure: CREATION OF LEFT ARM ARTERIOVENOUS (AV) FISTULA ;  Surgeon: Angelia Mould, MD;  Location: [place];  Service: Vascular;  Laterality: Left;  . BREAST RECONSTRUCTION    . BREAST SURGERY    . CATARACT EXTRACTION, BILATERAL    . CHOLECYSTECTOMY  1971  . EYE SURGERY Bilateral    lens implant  . history of Port removal    . LUNG BIOPSY  10/21/2015  . MASTECTOMY Left 2008  . PORTACATH PLACEMENT  12/2012   has had 2  . Status post stem cell transplant on September 28, 2008.     Review of Systems  10 point systems review negative except as above.    Objective:   Physical Exam  BP 128/60   Pulse 76   Temp 97.5 F (36.4 C)   Resp 16   Ht 5' 5.5"  (1.664 m)   Wt 155 lb 3.2 oz (70.4 kg)   BMI 25.43 kg/m   Appears uncomfortable, but in no acute distress  HEENT - WNL except Rt TM is retracted and dull, but not pink and Rt TM jt is exquisite tender and 'slips'. Neck - supple.  Chest - Clear equal BS. Cor - Nl HS. RRR w/o sig MGR. PP 1(+). No  edema. MS- FROM w/o deformities.  Gait Nl. Neuro -  Nl w/o focal abnormalities.    Assessment & Plan:   1. Arthralgia of right temporomandibular joint  - predniSONE (DELTASONE) 20 MG tablet; 1 tab 3 x day for 3 days, then 1 tab 2 x day for 3 days, then 1 tab 1 x day for 5 days  Dispense: 20 tablet; Refill: 0  2. Right serous otitis media, unspecified chronicity  - predniSONE (DELTASONE) 20 MG tablet; 1 tab 3 x day for 3 days, then 1 tab 2 x day for 3 days, then 1 tab 1 x day for 5 days  Dispense: 20 tablet; Refill: 0

## 2016-06-18 NOTE — Patient Instructions (Signed)
Temporomandibular Joint Syndrome Temporomandibular joint (TMJ) syndrome is a condition that affects the joints between your jaw and your skull. The TMJs are located near your ears and allow your jaw to open and close. These joints and the nearby muscles are involved in all movements of the jaw. People with TMJ syndrome have pain in the area of these joints and muscles. Chewing, biting, or other movements of the jaw can be difficult or painful. TMJ syndrome can be caused by various things. In many cases, the condition is mild and goes away within a few weeks. For some people, the condition can become a long-term problem. What are the causes? Possible causes of TMJ syndrome include:  Grinding your teeth or clenching your jaw. Some people do this when they are under stress.  Arthritis.  Injury to the jaw.  Head or neck injury.  Teeth or dentures that are not aligned well.  In some cases, the cause of TMJ syndrome may not be known. What are the signs or symptoms? The most common symptom is an aching pain on the side of the head in the area of the TMJ. Other symptoms may include:  Pain when moving your jaw, such as when chewing or biting.  Being unable to open your jaw all the way.  Making a clicking sound when you open your mouth.  Headache.  Earache.  Neck or shoulder pain.  How is this diagnosed? Diagnosis can usually be made based on your symptoms, your medical history, and a physical exam. Your health care provider may check the range of motion of your jaw. Imaging tests, such as X-rays or an MRI, are sometimes done. You may need to see your dentist to determine if your teeth and jaw are lined up correctly. How is this treated? TMJ syndrome often goes away on its own. If treatment is needed, the options may include:  Eating soft foods and applying ice or heat.  Medicines to relieve pain or inflammation.  Medicines to relax the muscles.  A splint, bite plate, or mouthpiece  to prevent teeth grinding or jaw clenching.  Relaxation techniques or counseling to help reduce stress.  Transcutaneous electrical nerve stimulation (TENS). This helps to relieve pain by applying an electrical current through the skin.  Acupuncture. This is sometimes helpful to relieve pain.  Jaw surgery. This is rarely needed.  Follow these instructions at home:  Take medicines only as directed by your health care provider.  Eat a soft diet if you are having trouble chewing.  Apply ice to the painful area. ? Put ice in a plastic bag. ? Place a towel between your skin and the bag. ? Leave the ice on for 20 minutes, 2-3 times a day.  Apply a warm compress to the painful area as directed.  Massage your jaw area and perform any jaw stretching exercises as recommended by your health care provider.  If you were given a mouthpiece or bite plate, wear it as directed.  Avoid foods that require a lot of chewing. Do not chew gum.  Keep all follow-up visits as directed by your health care provider. This is important. Contact a health care provider if:  You are having trouble eating.  You have new or worsening symptoms. Get help right away if:  Your jaw locks open or closed. This information is not intended to replace advice given to you by your health care provider. Make sure you discuss any questions you have with your health care provider. Document   Released: 09/26/2000 Document Revised: 09/01/2015 Document Reviewed: 08/06/2013 Elsevier Interactive Patient Education  2018 Elsevier Inc.  

## 2016-06-19 ENCOUNTER — Encounter: Payer: Self-pay | Admitting: Internal Medicine

## 2016-06-19 ENCOUNTER — Ambulatory Visit: Payer: Medicare Other

## 2016-06-25 ENCOUNTER — Other Ambulatory Visit: Payer: Self-pay | Admitting: Internal Medicine

## 2016-06-25 ENCOUNTER — Encounter: Payer: Self-pay | Admitting: Internal Medicine

## 2016-06-25 ENCOUNTER — Other Ambulatory Visit (HOSPITAL_BASED_OUTPATIENT_CLINIC_OR_DEPARTMENT_OTHER): Payer: Medicare Other

## 2016-06-25 ENCOUNTER — Ambulatory Visit (HOSPITAL_BASED_OUTPATIENT_CLINIC_OR_DEPARTMENT_OTHER): Payer: Medicare Other

## 2016-06-25 ENCOUNTER — Telehealth: Payer: Self-pay | Admitting: Internal Medicine

## 2016-06-25 ENCOUNTER — Ambulatory Visit (HOSPITAL_BASED_OUTPATIENT_CLINIC_OR_DEPARTMENT_OTHER): Payer: Medicare Other | Admitting: Internal Medicine

## 2016-06-25 VITALS — BP 121/54 | HR 65 | Temp 98.0°F | Resp 18 | Ht 65.5 in | Wt 157.5 lb

## 2016-06-25 DIAGNOSIS — C9000 Multiple myeloma not having achieved remission: Secondary | ICD-10-CM

## 2016-06-25 DIAGNOSIS — N185 Chronic kidney disease, stage 5: Secondary | ICD-10-CM | POA: Diagnosis not present

## 2016-06-25 DIAGNOSIS — Z5112 Encounter for antineoplastic immunotherapy: Secondary | ICD-10-CM | POA: Diagnosis not present

## 2016-06-25 DIAGNOSIS — D6481 Anemia due to antineoplastic chemotherapy: Secondary | ICD-10-CM | POA: Diagnosis not present

## 2016-06-25 DIAGNOSIS — Z5111 Encounter for antineoplastic chemotherapy: Secondary | ICD-10-CM

## 2016-06-25 DIAGNOSIS — D630 Anemia in neoplastic disease: Secondary | ICD-10-CM

## 2016-06-25 LAB — COMPREHENSIVE METABOLIC PANEL
ALT: 17 U/L (ref 0–55)
AST: 17 U/L (ref 5–34)
Albumin: 3.7 g/dL (ref 3.5–5.0)
Alkaline Phosphatase: 110 U/L (ref 40–150)
Anion Gap: 10 mEq/L (ref 3–11)
BILIRUBIN TOTAL: 0.93 mg/dL (ref 0.20–1.20)
BUN: 41.1 mg/dL — AB (ref 7.0–26.0)
CO2: 23 meq/L (ref 22–29)
CREATININE: 2.8 mg/dL — AB (ref 0.6–1.1)
Calcium: 9.5 mg/dL (ref 8.4–10.4)
Chloride: 109 mEq/L (ref 98–109)
EGFR: 16 mL/min/{1.73_m2} — ABNORMAL LOW (ref >90)
GLUCOSE: 91 mg/dL (ref 70–140)
Potassium: 3.6 mEq/L (ref 3.5–5.1)
SODIUM: 143 meq/L (ref 136–145)
TOTAL PROTEIN: 5.8 g/dL — AB (ref 6.4–8.3)

## 2016-06-25 LAB — CBC WITH DIFFERENTIAL/PLATELET
BASO%: 0.9 % (ref 0.0–2.0)
Basophils Absolute: 0 10*3/uL (ref 0.0–0.1)
EOS%: 1.6 % (ref 0.0–7.0)
Eosinophils Absolute: 0.1 10*3/uL (ref 0.0–0.5)
HCT: 25.9 % — ABNORMAL LOW (ref 34.8–46.6)
HGB: 8.7 g/dL — ABNORMAL LOW (ref 11.6–15.9)
LYMPH%: 21.5 % (ref 14.0–49.7)
MCH: 38.1 pg — ABNORMAL HIGH (ref 25.1–34.0)
MCHC: 33.4 g/dL (ref 31.5–36.0)
MCV: 114.1 fL — ABNORMAL HIGH (ref 79.5–101.0)
MONO#: 0.7 10*3/uL (ref 0.1–0.9)
MONO%: 13.6 % (ref 0.0–14.0)
NEUT%: 62.4 % (ref 38.4–76.8)
NEUTROS ABS: 3 10*3/uL (ref 1.5–6.5)
PLATELETS: 105 10*3/uL — AB (ref 145–400)
RBC: 2.27 10*6/uL — ABNORMAL LOW (ref 3.70–5.45)
RDW: 16.5 % — ABNORMAL HIGH (ref 11.2–14.5)
WBC: 4.8 10*3/uL (ref 3.9–10.3)
lymph#: 1 10*3/uL (ref 0.9–3.3)

## 2016-06-25 MED ORDER — ONDANSETRON HCL 8 MG PO TABS
ORAL_TABLET | ORAL | Status: AC
  Filled 2016-06-25: qty 1

## 2016-06-25 MED ORDER — DEXAMETHASONE SODIUM PHOSPHATE 10 MG/ML IJ SOLN
INTRAMUSCULAR | Status: AC
  Filled 2016-06-25: qty 1

## 2016-06-25 MED ORDER — SODIUM CHLORIDE 0.9 % IV SOLN
225.0000 mg/m2 | Freq: Once | INTRAVENOUS | Status: AC
  Administered 2016-06-25: 420 mg via INTRAVENOUS
  Filled 2016-06-25: qty 21

## 2016-06-25 MED ORDER — SODIUM CHLORIDE 0.9 % IV SOLN
Freq: Once | INTRAVENOUS | Status: AC
  Administered 2016-06-25: 12:00:00 via INTRAVENOUS

## 2016-06-25 MED ORDER — DEXTROSE 5 % IV SOLN
27.0000 mg/m2 | Freq: Once | INTRAVENOUS | Status: AC
  Administered 2016-06-25: 50 mg via INTRAVENOUS
  Filled 2016-06-25: qty 25

## 2016-06-25 MED ORDER — ONDANSETRON HCL 8 MG PO TABS
8.0000 mg | ORAL_TABLET | Freq: Once | ORAL | Status: AC
  Administered 2016-06-25: 8 mg via ORAL

## 2016-06-25 MED ORDER — DEXAMETHASONE SODIUM PHOSPHATE 10 MG/ML IJ SOLN
10.0000 mg | Freq: Once | INTRAMUSCULAR | Status: AC
  Administered 2016-06-25: 10 mg via INTRAVENOUS

## 2016-06-25 MED ORDER — HEPARIN SOD (PORK) LOCK FLUSH 100 UNIT/ML IV SOLN
500.0000 [IU] | Freq: Once | INTRAVENOUS | Status: AC | PRN
  Administered 2016-06-25: 500 [IU]
  Filled 2016-06-25: qty 5

## 2016-06-25 MED ORDER — SODIUM CHLORIDE 0.9 % IJ SOLN
10.0000 mL | INTRAMUSCULAR | Status: DC | PRN
  Administered 2016-06-25: 10 mL
  Filled 2016-06-25: qty 10

## 2016-06-25 NOTE — Progress Notes (Signed)
Ok to treat per Dr Julien Nordmann with creatinine 2.8

## 2016-06-25 NOTE — Progress Notes (Signed)
[place] [phone]   [phone]  OFFICE PROGRESS NOTE  [name], [place] [address]  DIAGNOSIS:  1. 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 Recurrent multiple myeloma initially diagnosed as a smoldering myeloma at [facility] in 2002. 3. Ductal carcinoma in situ status post mastectomy with sentinel lymph node biopsy in October 2008.  PRIOR THERAPY: 1. Status post treatment with tamoxifen from November 2008 through February 2009, discontinued secondary to intolerance. 2. Status post 3 cycles of chemotherapy with Revlimid and Decadron followed by 1 cycle of Decadron only with mild response. 3. Status post 2 cycles of systemic chemotherapy with Velcade, Doxil and Decadron discontinued secondary to significant peripheral neuropathy. Last dose was given May 2010 at [facility]. 4. Status post autologous peripheral blood stem cell transplant on October 01, 2008 at [facility] under the care of Dr. Phyllis Ginger.  5. Systemic chemotherapy with Carfilzomib initially was 20 mg/M2 and will be increased after cycle #1 to 36 mg/M2 on days 1, 2, 8, 9, 15 and 16 every 4 weeks in addition to Cytoxan 300 mg/M2 and Decadron 40 mg by mouth weekly basis, status post 4 cycles. First cycle on 12/29/2012. 6. She resumed chemotherapy with Carfilzomib, Cytoxan, and Decadron on 05/03/2014. Status post 6 cycles. 7. systemic chemotherapy again with Carfilzomib, Cytoxan and Decadron on 07/11/2015. Status post 3 cycles. 8. She completed a course of treatment with Diflucan 200 mg by mouth daily for around 6 weeks for cryptococcal infection of the lung.  CURRENT THERAPY: Resuming treatment again with Carfilzomib, Cytoxan and Decadron, first cycle 04/23/2016. Status post 2 cycles  INTERVAL HISTORY: Nancy Norris 76 y.o. female returns to the clinic today for follow-up visit. The  patient is feeling much better today. She continues to complain of mild fatigue. She denied having any chest pain, shortness of breath, cough or hemoptysis. She has no nausea, vomiting, diarrhea or constipation. She has no fever or chills. She denied having any weight loss or night sweats. She is tolerating her current treatment with Carfilzomib, Cytoxan and Decadron fairly well. She had repeat myeloma panel showed weeks ago and she is here for evaluation and discussion of her lab and recommendation regarding treatment of her condition.  MEDICAL HISTORY: Past Medical History:  Diagnosis Date  . Anemia   . Anginal pain ([place])    used NTG x 2 May 31 and 06/15/13   . Anxiety   . Arthritis   . B12 deficiency 12/04/2014  . Breast cancer ([place])   . CKD (chronic kidney disease) stage 3, GFR 30-59 ml/min   . Complication of anesthesia   . COPD (chronic obstructive pulmonary disease) ([place])   . Cryptococcal pneumonitis ([place]) 11/22/2015  . Depression   . Dizziness   . Dyspnea   . Fibromyalgia   . Fibromyalgia   . GERD (gastroesophageal reflux disease)   . Headache(784.0)   . Heart murmur   . Hemoptysis 10/21/2015  . History of blood transfusion    last one May 12   . Hx of cardiovascular stress test    LexiScan with low level exercise Myoview (02/2013): No ischemia, EF 72%; normal study  . Hx of echocardiogram    a.  Echocardiogram (12/26/2012): EF 82-50%, grade 1 diastolic dysfunction;   b.  Echocardiogram (02/2013): EF 55-60%, no WMA, trivial effusion  . Hyperkalemia   . Hyponatremia   . Hypothyroidism   .  Mucositis   . Multiple myeloma   . Myocardial infarction [facility])    in past, patient was unaware.   . Neuropathy   . Pain in joint, pelvic region and thigh 07/07/2015  . PONV (postoperative nausea and vomiting) 2008   after mastestomy    ALLERGIES:  is allergic to codeine; latex; other; onion; zyprexa [olanzapine]; adhesive [tape]; hydrocodone; iodinated diagnostic agents; and sulfa  antibiotics.  MEDICATIONS:  Current Outpatient Prescriptions  Medication Sig Dispense Refill  . acetaminophen (TYLENOL) 325 MG tablet Take 650 mg by mouth every 6 (six) hours as needed for mild pain or moderate pain.     Marland Kitchen albuterol (PROVENTIL HFA;VENTOLIN HFA) 108 (90 Base) MCG/ACT inhaler Inhale 1-2 puffs into the lungs every 6 (six) hours as needed for wheezing or shortness of breath (cough). 1 Inhaler 3  . azelastine (ASTELIN) 0.1 % nasal spray Place 2 sprays into both nostrils 2 (two) times daily. Use in each nostril as directed 30 mL 2  . buPROPion (WELLBUTRIN XL) 300 MG 24 hr tablet TAKE 1 TABLET BY MOUTH EACH MORNING FOR MOOD AND DEPRESSION. 90 tablet 1  . butalbital-acetaminophen-caffeine (FIORICET, ESGIC) 50-325-40 MG tablet TAKE 1 OR 2 TABLETS BY MOUTH EVERY 4 HOURS AS NEEDED FOR PAIN OR HEADACHE. 50 tablet 3  . Cholecalciferol 4000 UNITS TABS Take 4,000 Units by mouth daily.    . citalopram (CELEXA) 40 MG tablet TAKE 1 TABLET BY MOUTH DAILY OR AS DIRECTED 90 tablet 1  . cromolyn (OPTICROM) 4 % ophthalmic solution Place 1 drop into both eyes 4 (four) times daily.    . ferrous sulfate 325 (65 FE) MG tablet Take 1 tablet daily or as directed 90 tablet 1  . fluconazole (DIFLUCAN) 200 MG tablet Take 1 tablet (200 mg total) by mouth daily. 30 tablet 5  . gabapentin (NEURONTIN) 300 MG capsule TAKE 1 CAPSULE BY MOUTH AT BEDTIME. 90 capsule 1  . isosorbide mononitrate (IMDUR) 30 MG 24 hr tablet Take 1 tablet (30 mg total) by mouth daily. 90 tablet 3  . levothyroxine (SYNTHROID, LEVOTHROID) 150 MCG tablet Take 1 tablet (150 mcg total) by mouth daily. (Patient taking differently: Take 150 mcg by mouth daily. ) 90 tablet 1  . loperamide (IMODIUM) 2 MG capsule Take 1 capsule (2 mg total) by mouth as needed for diarrhea or loose stools. 30 capsule 2  . LORazepam (ATIVAN) 0.5 MG tablet Take 1 tablet (0.5 mg total) by mouth every 8 (eight) hours as needed (nausea). 30 tablet 0  . loteprednol (ALREX)  0.2 % SUSP Place 1 drop into both eyes 4 (four) times daily. 1 Bottle PRN  . magic mouthwash SOLN Take 5 mLs by mouth 4 (four) times daily as needed for mouth pain. 240 mL 1  . meloxicam (MOBIC) 15 MG tablet Take 1 tablet (15 mg total) by mouth daily. 90 tablet 3  . midodrine (PROAMATINE) 10 MG tablet TAKE 1 TABLET BY MOUTH 3 TIMES DAILY. 270 tablet 3  . montelukast (SINGULAIR) 10 MG tablet TAKE 1 TABLET BY MOUTH DAILY. 30 tablet 2  . nitroGLYCERIN (NITROSTAT) 0.4 MG SL tablet Place 0.4 mg under the tongue every 5 (five) minutes as needed for chest pain. Reported on 03/30/2015    . nystatin (MYCOSTATIN/NYSTOP) powder Apply topically 2 (two) times daily. 30 g 0  . predniSONE (DELTASONE) 20 MG tablet 1 tab 3 x day for 3 days, then 1 tab 2 x day for 3 days, then 1 tab 1 x day for 5  days 20 tablet 0  . PRESCRIPTION MEDICATION Pt gets chemo at [facility].    . ranitidine (ZANTAC) 300 MG tablet Take 1 to 2 tablets daily for heartburn & reflux to allow wean and transition from PPI / pantoprazole (Patient taking differently: Take 300 mg by mouth at bedtime. Take 1 to 2 tablets daily for heartburn & reflux to allow wean and transition from PPI / pantoprazole) 180 tablet 3  . senna (SENOKOT) 8.6 MG tablet Take 1 tablet by mouth at bedtime.    . traMADol (ULTRAM) 50 MG tablet Take 1 to 2 tablets by mouth 4 times a day as needed for pain. 180 tablet 0  . vitamin C (ASCORBIC ACID) 250 MG tablet Take 250 mg by mouth daily.     No current facility-administered medications for this visit.     SURGICAL HISTORY:  Past Surgical History:  Procedure Laterality Date  . ABDOMINAL HYSTERECTOMY  1981  . AV FISTULA PLACEMENT Left 06/19/2013   Procedure: CREATION OF LEFT ARM ARTERIOVENOUS (AV) FISTULA ;  Surgeon: Angelia Mould, MD;  Location: [place];  Service: Vascular;  Laterality: Left;  . BREAST RECONSTRUCTION    . BREAST SURGERY    . CATARACT EXTRACTION, BILATERAL    . CHOLECYSTECTOMY  1971  . EYE SURGERY Bilateral     lens implant  . history of Port removal    . LUNG BIOPSY  10/21/2015  . MASTECTOMY Left 2008  . PORTACATH PLACEMENT  12/2012   has had 2  . Status post stem cell transplant on September 28, 2008.      REVIEW OF SYSTEMS:  Constitutional: positive for fatigue Eyes: negative Ears, nose, mouth, throat, and face: negative Respiratory: negative Cardiovascular: negative Gastrointestinal: negative Genitourinary:negative Integument/breast: negative Hematologic/lymphatic: negative Musculoskeletal:negative Neurological: negative Behavioral/Psych: negative Endocrine: negative Allergic/Immunologic: negative   PHYSICAL EXAMINATION: General appearance: alert, cooperative, fatigued and no distress Head: Normocephalic, without obvious abnormality, atraumatic Neck: no adenopathy, no JVD, supple, symmetrical, trachea midline and thyroid not enlarged, symmetric, no tenderness/mass/nodules Lymph nodes: Cervical, supraclavicular, and axillary nodes normal. Resp: clear to auscultation bilaterally Back: symmetric, no curvature. ROM normal. No CVA tenderness. Cardio: regular rate and rhythm, S1, S2 normal, no murmur, click, rub or gallop GI: soft, non-tender; bowel sounds normal; no masses,  no organomegaly Extremities: extremities normal, atraumatic, no cyanosis or edema Neurologic: Alert and oriented X 3, normal strength and tone. Normal symmetric reflexes. Normal coordination and gait  ECOG PERFORMANCE STATUS: 1 - Symptomatic but completely ambulatory  Blood pressure (!) 121/54, pulse 65, temperature 98 F (36.7 C), temperature source Oral, resp. rate 18, height 5' 5.5" (1.664 m), weight 157 lb 8 oz (71.4 kg), SpO2 100 %.  LABORATORY DATA: Lab Results  Component Value Date   WBC 4.8 06/25/2016   HGB 8.7 (L) 06/25/2016   HCT 25.9 (L) 06/25/2016   MCV 114.1 (H) 06/25/2016   PLT 105 (L) 06/25/2016      Chemistry      Component Value Date/Time   NA 142 06/12/2016 0828   K 4.4  06/12/2016 0828   CL 106 02/20/2016 1213   CL 105 03/19/2012 0811   CO2 21 (L) 06/12/2016 0828   BUN 39.4 (H) 06/12/2016 0828   CREATININE 2.5 (H) 06/12/2016 0828      Component Value Date/Time   CALCIUM 9.1 06/12/2016 0828   ALKPHOS 150 06/12/2016 0828   AST 22 06/12/2016 0828   ALT 36 06/12/2016 0828   BILITOT 0.62 06/12/2016 8546  RADIOGRAPHIC STUDIES: No results found.  ASSESSMENT AND PLAN:  This is a very pleasant 76 years old white female with relapsed multiple myeloma status post several chemotherapy regimens and she is currently on treatment with Carfilzomib, Cytoxan and Decadron status post 2 cycles. She has been to rating her treatment fairly well with no significant adverse effects. Repeat myeloma panel performed recently showed improvement of her disease. I discussed the lab results with the patient and gave her a copy of her report. I recommended for her to continue her current treatment with chemotherapy with the same regimen and she will proceed with cycle #3 today. For the chemotherapy-induced anemia, we will continue to monitor her hemoglobin and hematocrit closely and consider the patient for PRBCs transfusion if her hemoglobin drop to less than 8.0 G/DL. For the chronic renal failure, she is followed by nephrology. The patient would come back for follow-up visit in 4 weeks for evaluation before starting cycle #4. She was advised to call immediately if she has any concerning symptoms in the interval. The patient voices understanding of current disease status and treatment options and is in agreement with the current care plan. All questions were answered. The patient knows to call the clinic with any problems, questions or concerns. We can certainly see the patient much sooner if necessary.  Disclaimer: This note was dictated with voice recognition software. Similar sounding words can inadvertently be transcribed and may not be corrected upon review.

## 2016-06-25 NOTE — Patient Instructions (Signed)
[facility] Discharge Instructions for Patients Receiving Chemotherapy  Today you received the following chemotherapy agents Kyprolis and Cytoxan  To help prevent nausea and vomiting after your treatment, we encourage you to take your nausea medication.   If you develop nausea and vomiting that is not controlled by your nausea medication, call the clinic.   BELOW ARE SYMPTOMS THAT SHOULD BE REPORTED IMMEDIATELY:  *FEVER GREATER THAN 100.5 F  *CHILLS WITH OR WITHOUT FEVER  NAUSEA AND VOMITING THAT IS NOT CONTROLLED WITH YOUR NAUSEA MEDICATION  *UNUSUAL SHORTNESS OF BREATH  *UNUSUAL BRUISING OR BLEEDING  TENDERNESS IN MOUTH AND THROAT WITH OR WITHOUT PRESENCE OF ULCERS  *URINARY PROBLEMS  *BOWEL PROBLEMS  UNUSUAL RASH Items with * indicate a potential emergency and should be followed up as soon as possible.  Feel free to call the clinic you have any questions or concerns. The clinic phone number is [phone].  Please show the [place] at check-in to the Emergency Department and triage nurse.

## 2016-06-25 NOTE — Telephone Encounter (Signed)
Scheduled appt per 6/11 los - Gave patient AVS and calender per LOS.

## 2016-06-26 ENCOUNTER — Ambulatory Visit (HOSPITAL_BASED_OUTPATIENT_CLINIC_OR_DEPARTMENT_OTHER): Payer: Medicare Other

## 2016-06-26 DIAGNOSIS — C9000 Multiple myeloma not having achieved remission: Secondary | ICD-10-CM | POA: Diagnosis not present

## 2016-06-26 DIAGNOSIS — Z5112 Encounter for antineoplastic immunotherapy: Secondary | ICD-10-CM | POA: Diagnosis not present

## 2016-06-26 MED ORDER — SODIUM CHLORIDE 0.9 % IV SOLN
Freq: Once | INTRAVENOUS | Status: AC
  Administered 2016-06-26: 08:00:00 via INTRAVENOUS

## 2016-06-26 MED ORDER — DEXTROSE 5 % IV SOLN
27.0000 mg/m2 | Freq: Once | INTRAVENOUS | Status: AC
  Administered 2016-06-26: 50 mg via INTRAVENOUS
  Filled 2016-06-26: qty 25

## 2016-06-26 MED ORDER — SODIUM CHLORIDE 0.9 % IJ SOLN
10.0000 mL | INTRAMUSCULAR | Status: DC | PRN
  Administered 2016-06-26: 10 mL
  Filled 2016-06-26: qty 10

## 2016-06-26 MED ORDER — SODIUM CHLORIDE 0.9 % IV SOLN: Freq: Once | INTRAVENOUS | Status: DC

## 2016-06-26 MED ORDER — DEXAMETHASONE SODIUM PHOSPHATE 10 MG/ML IJ SOLN
INTRAMUSCULAR | Status: AC
  Filled 2016-06-26: qty 1

## 2016-06-26 MED ORDER — DEXAMETHASONE SODIUM PHOSPHATE 10 MG/ML IJ SOLN
10.0000 mg | Freq: Once | INTRAMUSCULAR | Status: AC
  Administered 2016-06-26: 10 mg via INTRAVENOUS

## 2016-06-26 MED ORDER — ONDANSETRON HCL 8 MG PO TABS: 8.0000 mg | ORAL_TABLET | Freq: Once | ORAL | Status: DC

## 2016-06-26 MED ORDER — HEPARIN SOD (PORK) LOCK FLUSH 100 UNIT/ML IV SOLN
500.0000 [IU] | Freq: Once | INTRAVENOUS | Status: AC | PRN
  Administered 2016-06-26: 500 [IU]
  Filled 2016-06-26: qty 5

## 2016-06-26 NOTE — Patient Instructions (Signed)
[place] Discharge Instructions for Patients Receiving Chemotherapy  Today you received the following chemotherapy agents kyprolis   To help prevent nausea and vomiting after your treatment, we encourage you to take your nausea medication as directed  If you develop nausea and vomiting that is not controlled by your nausea medication, call the clinic.   BELOW ARE SYMPTOMS THAT SHOULD BE REPORTED IMMEDIATELY:  *FEVER GREATER THAN 100.5 F  *CHILLS WITH OR WITHOUT FEVER  NAUSEA AND VOMITING THAT IS NOT CONTROLLED WITH YOUR NAUSEA MEDICATION  *UNUSUAL SHORTNESS OF BREATH  *UNUSUAL BRUISING OR BLEEDING  TENDERNESS IN MOUTH AND THROAT WITH OR WITHOUT PRESENCE OF ULCERS  *URINARY PROBLEMS  *BOWEL PROBLEMS  UNUSUAL RASH Items with * indicate a potential emergency and should be followed up as soon as possible.  Feel free to call the clinic you have any questions or concerns. The clinic phone number is [phone].

## 2016-06-28 DIAGNOSIS — C9000 Multiple myeloma not having achieved remission: Secondary | ICD-10-CM | POA: Diagnosis not present

## 2016-06-28 DIAGNOSIS — J449 Chronic obstructive pulmonary disease, unspecified: Secondary | ICD-10-CM | POA: Diagnosis not present

## 2016-06-28 DIAGNOSIS — I5030 Unspecified diastolic (congestive) heart failure: Secondary | ICD-10-CM | POA: Diagnosis not present

## 2016-06-28 DIAGNOSIS — R809 Proteinuria, unspecified: Secondary | ICD-10-CM | POA: Diagnosis not present

## 2016-06-28 DIAGNOSIS — E039 Hypothyroidism, unspecified: Secondary | ICD-10-CM | POA: Diagnosis not present

## 2016-06-28 DIAGNOSIS — D631 Anemia in chronic kidney disease: Secondary | ICD-10-CM | POA: Diagnosis not present

## 2016-06-28 DIAGNOSIS — I951 Orthostatic hypotension: Secondary | ICD-10-CM | POA: Diagnosis not present

## 2016-06-28 DIAGNOSIS — R079 Chest pain, unspecified: Secondary | ICD-10-CM | POA: Diagnosis not present

## 2016-06-28 DIAGNOSIS — N184 Chronic kidney disease, stage 4 (severe): Secondary | ICD-10-CM | POA: Diagnosis not present

## 2016-06-28 DIAGNOSIS — I770 Arteriovenous fistula, acquired: Secondary | ICD-10-CM | POA: Diagnosis not present

## 2016-06-29 ENCOUNTER — Other Ambulatory Visit: Payer: Self-pay | Admitting: Internal Medicine

## 2016-06-29 DIAGNOSIS — K219 Gastro-esophageal reflux disease without esophagitis: Secondary | ICD-10-CM

## 2016-07-02 ENCOUNTER — Other Ambulatory Visit (HOSPITAL_BASED_OUTPATIENT_CLINIC_OR_DEPARTMENT_OTHER): Payer: Medicare Other

## 2016-07-02 ENCOUNTER — Ambulatory Visit: Payer: Medicare Other

## 2016-07-02 ENCOUNTER — Telehealth: Payer: Self-pay | Admitting: Internal Medicine

## 2016-07-02 ENCOUNTER — Other Ambulatory Visit: Payer: Self-pay | Admitting: Medical Oncology

## 2016-07-02 ENCOUNTER — Ambulatory Visit (HOSPITAL_COMMUNITY)
Admission: RE | Admit: 2016-07-02 | Discharge: 2016-07-02 | Disposition: A | Discharge status: Home / Self Care | Payer: Medicare Other | Source: Ambulatory Visit | Attending: Internal Medicine | Admitting: Internal Medicine

## 2016-07-02 ENCOUNTER — Ambulatory Visit (HOSPITAL_BASED_OUTPATIENT_CLINIC_OR_DEPARTMENT_OTHER): Payer: Medicare Other

## 2016-07-02 VITALS — BP 147/69 | HR 73 | Temp 99.0°F

## 2016-07-02 DIAGNOSIS — D630 Anemia in neoplastic disease: Secondary | ICD-10-CM

## 2016-07-02 DIAGNOSIS — C801 Malignant (primary) neoplasm, unspecified: Secondary | ICD-10-CM | POA: Insufficient documentation

## 2016-07-02 DIAGNOSIS — Z95828 Presence of other vascular implants and grafts: Secondary | ICD-10-CM

## 2016-07-02 DIAGNOSIS — C9000 Multiple myeloma not having achieved remission: Secondary | ICD-10-CM

## 2016-07-02 DIAGNOSIS — Z5112 Encounter for antineoplastic immunotherapy: Secondary | ICD-10-CM | POA: Diagnosis not present

## 2016-07-02 DIAGNOSIS — Z5111 Encounter for antineoplastic chemotherapy: Secondary | ICD-10-CM

## 2016-07-02 LAB — PREPARE RBC (CROSSMATCH)

## 2016-07-02 LAB — COMPREHENSIVE METABOLIC PANEL
ALT: 27 U/L (ref 0–55)
ANION GAP: 10 meq/L (ref 3–11)
AST: 19 U/L (ref 5–34)
Albumin: 3.8 g/dL (ref 3.5–5.0)
Alkaline Phosphatase: 111 U/L (ref 40–150)
BILIRUBIN TOTAL: 1.11 mg/dL (ref 0.20–1.20)
BUN: 34.5 mg/dL — ABNORMAL HIGH (ref 7.0–26.0)
CALCIUM: 8.4 mg/dL (ref 8.4–10.4)
CO2: 21 mEq/L — ABNORMAL LOW (ref 22–29)
Chloride: 110 mEq/L — ABNORMAL HIGH (ref 98–109)
Creatinine: 2.9 mg/dL — ABNORMAL HIGH (ref 0.6–1.1)
EGFR: 15 mL/min/{1.73_m2} — ABNORMAL LOW (ref >90)
Glucose: 116 mg/dl (ref 70–140)
Potassium: 3.9 mEq/L (ref 3.5–5.1)
Sodium: 141 mEq/L (ref 136–145)
TOTAL PROTEIN: 6 g/dL — AB (ref 6.4–8.3)

## 2016-07-02 LAB — CBC WITH DIFFERENTIAL/PLATELET
BASO%: 0.2 % (ref 0.0–2.0)
Basophils Absolute: 0 10*3/uL (ref 0.0–0.1)
EOS%: 1 % (ref 0.0–7.0)
Eosinophils Absolute: 0.1 10*3/uL (ref 0.0–0.5)
HEMATOCRIT: 24.4 % — AB (ref 34.8–46.6)
HGB: 7.8 g/dL — ABNORMAL LOW (ref 11.6–15.9)
LYMPH#: 0.8 10*3/uL — AB (ref 0.9–3.3)
LYMPH%: 15.9 % (ref 14.0–49.7)
MCH: 37.1 pg — ABNORMAL HIGH (ref 25.1–34.0)
MCHC: 32 g/dL (ref 31.5–36.0)
MCV: 116.2 fL — ABNORMAL HIGH (ref 79.5–101.0)
MONO#: 0.4 10*3/uL (ref 0.1–0.9)
MONO%: 8.7 % (ref 0.0–14.0)
NEUT%: 74.2 % (ref 38.4–76.8)
NEUTROS ABS: 3.6 10*3/uL (ref 1.5–6.5)
Platelets: 80 10*3/uL — ABNORMAL LOW (ref 145–400)
RBC: 2.1 10*6/uL — ABNORMAL LOW (ref 3.70–5.45)
RDW: 15.7 % — ABNORMAL HIGH (ref 11.2–14.5)
WBC: 4.8 10*3/uL (ref 3.9–10.3)

## 2016-07-02 MED ORDER — ONDANSETRON HCL 8 MG PO TABS: 8.0000 mg | ORAL_TABLET | Freq: Once | ORAL | Status: DC

## 2016-07-02 MED ORDER — PALONOSETRON HCL INJECTION 0.25 MG/5ML
INTRAVENOUS | Status: AC
  Filled 2016-07-02: qty 5

## 2016-07-02 MED ORDER — SODIUM CHLORIDE 0.9 % IV SOLN
Freq: Once | INTRAVENOUS | Status: AC
  Administered 2016-07-02: 15:00:00 via INTRAVENOUS

## 2016-07-02 MED ORDER — SODIUM CHLORIDE 0.9 % IV SOLN
225.0000 mg/m2 | Freq: Once | INTRAVENOUS | Status: AC
  Administered 2016-07-02: 420 mg via INTRAVENOUS
  Filled 2016-07-02: qty 21

## 2016-07-02 MED ORDER — DEXTROSE 5 % IV SOLN
27.0000 mg/m2 | Freq: Once | INTRAVENOUS | Status: AC
  Administered 2016-07-02: 50 mg via INTRAVENOUS
  Filled 2016-07-02: qty 25

## 2016-07-02 MED ORDER — DEXAMETHASONE SODIUM PHOSPHATE 10 MG/ML IJ SOLN
INTRAMUSCULAR | Status: AC
  Filled 2016-07-02: qty 1

## 2016-07-02 MED ORDER — DEXAMETHASONE SODIUM PHOSPHATE 10 MG/ML IJ SOLN
10.0000 mg | Freq: Once | INTRAMUSCULAR | Status: AC
  Administered 2016-07-02: 10 mg via INTRAVENOUS

## 2016-07-02 MED ORDER — HEPARIN SOD (PORK) LOCK FLUSH 100 UNIT/ML IV SOLN
500.0000 [IU] | Freq: Once | INTRAVENOUS | Status: AC | PRN
  Administered 2016-07-02: 500 [IU]
  Filled 2016-07-02: qty 5

## 2016-07-02 MED ORDER — SODIUM CHLORIDE 0.9 % IJ SOLN
10.0000 mL | INTRAMUSCULAR | Status: DC | PRN
  Administered 2016-07-02: 10 mL
  Filled 2016-07-02: qty 10

## 2016-07-02 MED ORDER — SODIUM CHLORIDE 0.9% FLUSH
10.0000 mL | INTRAVENOUS | Status: DC | PRN
  Administered 2016-07-02: 10 mL via INTRAVENOUS
  Filled 2016-07-02: qty 10

## 2016-07-02 NOTE — Telephone Encounter (Signed)
CONFIRMED 6/22 APPT AT 1030 PER [place] [facility]

## 2016-07-02 NOTE — Patient Instructions (Signed)
Implanted Port Home Guide An implanted port is a type of central line that is placed under the skin. Central lines are used to provide IV access when treatment or nutrition needs to be given through a person's veins. Implanted ports are used for long-term IV access. An implanted port may be placed because:  You need IV medicine that would be irritating to the small veins in your hands or arms.  You need long-term IV medicines, such as antibiotics.  You need IV nutrition for a long period.  You need frequent blood draws for lab tests.  You need dialysis.  Implanted ports are usually placed in the chest area, but they can also be placed in the upper arm, the abdomen, or the leg. An implanted port has two main parts:  Reservoir. The reservoir is round and will appear as a small, raised area under your skin. The reservoir is the part where a needle is inserted to give medicines or draw blood.  Catheter. The catheter is a thin, flexible tube that extends from the reservoir. The catheter is placed into a large vein. Medicine that is inserted into the reservoir goes into the catheter and then into the vein.  How will I care for my incision site? Do not get the incision site wet. Bathe or shower as directed by your health care provider. How is my port accessed? Special steps must be taken to access the port:  Before the port is accessed, a numbing cream can be placed on the skin. This helps numb the skin over the port site.  Your health care provider uses a sterile technique to access the port. ? Your health care provider must put on a mask and sterile gloves. ? The skin over your port is cleaned carefully with an antiseptic and allowed to dry. ? The port is gently pinched between sterile gloves, and a needle is inserted into the port.  Only "non-coring" port needles should be used to access the port. Once the port is accessed, a blood return should be checked. This helps ensure that the port  is in the vein and is not clogged.  If your port needs to remain accessed for a constant infusion, a clear (transparent) bandage will be placed over the needle site. The bandage and needle will need to be changed every week, or as directed by your health care provider.  Keep the bandage covering the needle clean and dry. Do not get it wet. Follow your health care provider's instructions on how to take a shower or bath while the port is accessed.  If your port does not need to stay accessed, no bandage is needed over the port.  What is flushing? Flushing helps keep the port from getting clogged. Follow your health care provider's instructions on how and when to flush the port. Ports are usually flushed with saline solution or a medicine called heparin. The need for flushing will depend on how the port is used.  If the port is used for intermittent medicines or blood draws, the port will need to be flushed: ? After medicines have been given. ? After blood has been drawn. ? As part of routine maintenance.  If a constant infusion is running, the port may not need to be flushed.  How long will my port stay implanted? The port can stay in for as long as your health care provider thinks it is needed. When it is time for the port to come out, surgery will be   done to remove it. The procedure is similar to the one performed when the port was put in. When should I seek immediate medical care? When you have an implanted port, you should seek immediate medical care if:  You notice a bad smell coming from the incision site.  You have swelling, redness, or drainage at the incision site.  You have more swelling or pain at the port site or the surrounding area.  You have a fever that is not controlled with medicine.  This information is not intended to replace advice given to you by your health care provider. Make sure you discuss any questions you have with your health care provider. Document  Released: 01/01/2005 Document Revised: 06/09/2015 Document Reviewed: 09/08/2012 Elsevier Interactive Patient Education  2017 Elsevier Inc.  

## 2016-07-02 NOTE — Progress Notes (Signed)
Labs reviewed by Dr. Julien Nordmann: OK to treat today with PLT 80, Hgb 7.8 and creatinine 2.9. Pt will be crossmatched for 2 units PRBC.

## 2016-07-03 ENCOUNTER — Ambulatory Visit (HOSPITAL_BASED_OUTPATIENT_CLINIC_OR_DEPARTMENT_OTHER): Payer: Medicare Other

## 2016-07-03 DIAGNOSIS — Z5112 Encounter for antineoplastic immunotherapy: Secondary | ICD-10-CM

## 2016-07-03 DIAGNOSIS — C801 Malignant (primary) neoplasm, unspecified: Secondary | ICD-10-CM | POA: Diagnosis not present

## 2016-07-03 DIAGNOSIS — C9000 Multiple myeloma not having achieved remission: Secondary | ICD-10-CM | POA: Diagnosis not present

## 2016-07-03 DIAGNOSIS — D630 Anemia in neoplastic disease: Secondary | ICD-10-CM | POA: Diagnosis not present

## 2016-07-03 MED ORDER — ONDANSETRON HCL 8 MG PO TABS
8.0000 mg | ORAL_TABLET | Freq: Once | ORAL | Status: AC
  Administered 2016-07-03: 8 mg via ORAL

## 2016-07-03 MED ORDER — SODIUM CHLORIDE 0.9% FLUSH
10.0000 mL | INTRAVENOUS | Status: AC | PRN
  Administered 2016-07-03: 10 mL
  Filled 2016-07-03: qty 10

## 2016-07-03 MED ORDER — HEPARIN SOD (PORK) LOCK FLUSH 100 UNIT/ML IV SOLN
500.0000 [IU] | Freq: Once | INTRAVENOUS | Status: DC | PRN
  Filled 2016-07-03: qty 5

## 2016-07-03 MED ORDER — DIPHENHYDRAMINE HCL 25 MG PO CAPS
25.0000 mg | ORAL_CAPSULE | Freq: Once | ORAL | Status: AC
  Administered 2016-07-03: 25 mg via ORAL

## 2016-07-03 MED ORDER — SODIUM CHLORIDE 0.9 % IV SOLN
Freq: Once | INTRAVENOUS | Status: AC
  Administered 2016-07-03: 09:00:00 via INTRAVENOUS

## 2016-07-03 MED ORDER — HEPARIN SOD (PORK) LOCK FLUSH 100 UNIT/ML IV SOLN
500.0000 [IU] | Freq: Every day | INTRAVENOUS | Status: AC | PRN
  Administered 2016-07-03: 500 [IU]
  Filled 2016-07-03: qty 5

## 2016-07-03 MED ORDER — DEXAMETHASONE SODIUM PHOSPHATE 10 MG/ML IJ SOLN
INTRAMUSCULAR | Status: AC
  Filled 2016-07-03: qty 1

## 2016-07-03 MED ORDER — SODIUM CHLORIDE 0.9 % IJ SOLN
10.0000 mL | INTRAMUSCULAR | Status: DC | PRN
  Filled 2016-07-03: qty 10

## 2016-07-03 MED ORDER — DEXAMETHASONE SODIUM PHOSPHATE 10 MG/ML IJ SOLN
10.0000 mg | Freq: Once | INTRAMUSCULAR | Status: AC
  Administered 2016-07-03: 10 mg via INTRAVENOUS

## 2016-07-03 MED ORDER — DEXTROSE 5 % IV SOLN
27.0000 mg/m2 | Freq: Once | INTRAVENOUS | Status: AC
  Administered 2016-07-03: 50 mg via INTRAVENOUS
  Filled 2016-07-03: qty 25

## 2016-07-03 MED ORDER — ACETAMINOPHEN 325 MG PO TABS
ORAL_TABLET | ORAL | Status: AC
  Filled 2016-07-03: qty 2

## 2016-07-03 MED ORDER — DIPHENHYDRAMINE HCL 25 MG PO CAPS
ORAL_CAPSULE | ORAL | Status: AC
  Filled 2016-07-03: qty 1

## 2016-07-03 MED ORDER — ACETAMINOPHEN 325 MG PO TABS
650.0000 mg | ORAL_TABLET | Freq: Once | ORAL | Status: AC
  Administered 2016-07-03: 650 mg via ORAL

## 2016-07-03 MED ORDER — SODIUM CHLORIDE 0.9 % IV SOLN
250.0000 mL | Freq: Once | INTRAVENOUS | Status: AC
  Administered 2016-07-03: 250 mL via INTRAVENOUS

## 2016-07-03 MED ORDER — ONDANSETRON HCL 8 MG PO TABS
ORAL_TABLET | ORAL | Status: AC
  Filled 2016-07-03: qty 1

## 2016-07-03 NOTE — Patient Instructions (Signed)
Blood Transfusion, Adult A blood transfusion is a procedure in which you receive donated blood, including plasma, platelets, and red blood cells, through an IV tube. You may need a blood transfusion because of illness, surgery, or injury. The blood may come from a donor. You may also be able to donate blood for yourself (autologous blood donation) before a surgery if you know that you might require a blood transfusion. The blood given in a transfusion is made up of different types of cells. You may receive:  Red blood cells. These carry oxygen to the cells in the body.  White blood cells. These help you fight infections.  Platelets. These help your blood to clot.  Plasma. This is the liquid part of your blood and it helps with fluid imbalances.  If you have hemophilia or another clotting disorder, you may also receive other types of blood products. Tell a health care provider about:  Any allergies you have.  All medicines you are taking, including vitamins, herbs, eye drops, creams, and over-the-counter medicines.  Any problems you or family members have had with anesthetic medicines.  Any blood disorders you have.  Any surgeries you have had.  Any medical conditions you have, including any recent fever or cold symptoms.  Whether you are pregnant or may be pregnant.  Any previous reactions you have had during a blood transfusion. What are the risks? Generally, this is a safe procedure. However, problems may occur, including:  Having an allergic reaction to something in the donated blood. Hives and itching may be symptoms of this type of reaction.  Fever. This may be a reaction to the white blood cells in the transfused blood. Nausea or chest pain may accompany a fever.  Iron overload. This can happen from having many transfusions.  Transfusion-related acute lung injury (TRALI). This is a rare reaction that causes lung damage. The cause is not known.TRALI can occur within hours  of a transfusion or several days later.  Sudden (acute) or delayed hemolytic reactions. This happens if your blood does not match the cells in your transfusion. Your body's defense system (immune system) may try to attack the new cells. This complication is rare. The symptoms include fever, chills, nausea, and low back pain or chest pain.  Infection or disease transmission. This is rare.  What happens before the procedure?  You will have a blood test to determine your blood type. This is necessary to know what kind of blood your body will accept and to match it to the donor blood.  If you are going to have a planned surgery, you may be able to do an autologous blood donation. This may be done in case you need to have a transfusion.  If you have had an allergic reaction to a transfusion in the past, you may be given medicine to help prevent a reaction. This medicine may be given to you by mouth or through an IV tube.  You will have your temperature, blood pressure, and pulse monitored before the transfusion.  Follow instructions from your health care provider about eating and drinking restrictions.  Ask your health care provider about: ? Changing or stopping your regular medicines. This is especially important if you are taking diabetes medicines or blood thinners. ? Taking medicines such as aspirin and ibuprofen. These medicines can thin your blood. Do not take these medicines before your procedure if your health care provider instructs you not to. What happens during the procedure?  An IV tube will   be inserted into one of your veins.  The bag of donated blood will be attached to your IV tube. The blood will then enter through your vein.  Your temperature, blood pressure, and pulse will be monitored regularly during the transfusion. This monitoring is done to detect early signs of a transfusion reaction.  If you have any signs or symptoms of a reaction, your transfusion will be stopped  and you may be given medicine.  When the transfusion is complete, your IV tube will be removed.  Pressure may be applied to the IV site for a few minutes.  A bandage (dressing) will be applied. The procedure may vary among health care providers and hospitals. What happens after the procedure?  Your temperature, blood pressure, heart rate, breathing rate, and blood oxygen level will be monitored often.  Your blood may be tested to see how you are responding to the transfusion.  You may be warmed with fluids or blankets to maintain a normal body temperature. Summary  A blood transfusion is a procedure in which you receive donated blood, including plasma, platelets, and red blood cells, through an IV tube.  Your temperature, blood pressure, and pulse will be monitored before, during, and after the transfusion.  Your blood may be tested after the transfusion to see how your body has responded. This information is not intended to replace advice given to you by your health care provider. Make sure you discuss any questions you have with your health care provider. Document Released: 12/30/1999 Document Revised: 09/29/2015 Document Reviewed: 09/29/2015 Elsevier Interactive Patient Education  2018 [place] Discharge Instructions for Patients Receiving Chemotherapy  Today you received the following chemotherapy agents kyprolis   To help prevent nausea and vomiting after your treatment, we encourage you to take your nausea medication as directed  If you develop nausea and vomiting that is not controlled by your nausea medication, call the clinic.   BELOW ARE SYMPTOMS THAT SHOULD BE REPORTED IMMEDIATELY:  *FEVER GREATER THAN 100.5 F  *CHILLS WITH OR WITHOUT FEVER  NAUSEA AND VOMITING THAT IS NOT CONTROLLED WITH YOUR NAUSEA MEDICATION  *UNUSUAL SHORTNESS OF BREATH  *UNUSUAL BRUISING OR BLEEDING  TENDERNESS IN MOUTH AND THROAT WITH OR WITHOUT  PRESENCE OF ULCERS  *URINARY PROBLEMS  *BOWEL PROBLEMS  UNUSUAL RASH Items with * indicate a potential emergency and should be followed up as soon as possible.  Feel free to call the clinic you have any questions or concerns. The clinic phone number is [phone].

## 2016-07-04 LAB — TYPE AND SCREEN
ABO/RH(D): O POS
Antibody Screen: NEGATIVE
UNIT DIVISION: 0
UNIT DIVISION: 0

## 2016-07-04 LAB — BPAM RBC
BLOOD PRODUCT EXPIRATION DATE: 201807062359
BLOOD PRODUCT EXPIRATION DATE: 201807072359
ISSUE DATE / TIME: 201806191004
ISSUE DATE / TIME: 201806191004
UNIT TYPE AND RH: 5100
Unit Type and Rh: 5100

## 2016-07-05 ENCOUNTER — Ambulatory Visit (INDEPENDENT_AMBULATORY_CARE_PROVIDER_SITE_OTHER): Payer: Medicare Other | Admitting: Internal Medicine

## 2016-07-05 ENCOUNTER — Encounter: Payer: Self-pay | Admitting: Internal Medicine

## 2016-07-05 ENCOUNTER — Telehealth: Payer: Self-pay | Admitting: Internal Medicine

## 2016-07-05 VITALS — BP 122/64 | HR 72 | Temp 97.0°F | Resp 16 | Ht 65.5 in | Wt 160.0 lb

## 2016-07-05 DIAGNOSIS — F329 Major depressive disorder, single episode, unspecified: Secondary | ICD-10-CM

## 2016-07-05 DIAGNOSIS — F32A Depression, unspecified: Secondary | ICD-10-CM

## 2016-07-05 DIAGNOSIS — I951 Orthostatic hypotension: Secondary | ICD-10-CM | POA: Diagnosis not present

## 2016-07-05 MED ORDER — AMPHETAMINE-DEXTROAMPHETAMINE 20 MG PO TABS: ORAL_TABLET | ORAL | 0 refills | Status: DC

## 2016-07-05 NOTE — Telephone Encounter (Signed)
lvm to inform pt of lab/flush appt 6/22 at 0845 per sch msg

## 2016-07-05 NOTE — Progress Notes (Signed)
Subjective:    Patient ID: Nancy Norris, female    DOB: [birth year], 76 y.o.   MRN: [id number]  HPI  Patient is a 76 yo DWF with dysautonomia/hypotension, CKD5/ESRD, PreDM, MM/autoStem Cell Transplant returns for f/u of depression refractory to treatment with Bupropion/Citalopram and aggravated dealing with her personal impending Dialysis and her in ability to care for her 2 yo mother with worsening agitated Dementia. Patient was started on Adderall 1 month ago and feels much improved and able to deal with her personal issues with improved motivation and resolution of her depression. Denies SI.   Medication Sig  . acetaminophen (TYLENOL) 325 MG tablet Take 650 mg by mouth every 6 (six) hours as needed for mild pain or moderate pain.   Marland Kitchen albuterol (PROVENTIL HFA;VENTOLIN HFA) 108 (90 Base) MCG/ACT inhaler Inhale 1-2 puffs into the lungs every 6 (six) hours as needed for wheezing or shortness of breath (cough).  Marland Kitchen amphetamine-dextroamphetamine (ADDERALL) 20 MG tablet Take 20 mg by mouth 2 (two) times daily.  Marland Kitchen buPROPion (WELLBUTRIN XL) 300 MG 24 hr tablet TAKE 1 TABLET BY MOUTH EACH MORNING FOR MOOD AND DEPRESSION.  . butalbital-acetaminophen-caffeine (FIORICET, ESGIC) 50-325-40 MG tablet TAKE 1 OR 2 TABLETS BY MOUTH EVERY 4 HOURS AS NEEDED FOR PAIN OR HEADACHE.  Marland Kitchen Cholecalciferol 4000 UNITS TABS Take 4,000 Units by mouth daily.  . citalopram (CELEXA) 40 MG tablet TAKE 1 TABLET BY MOUTH DAILY OR AS DIRECTED  . cromolyn (OPTICROM) 4 % ophthalmic solution Place 1 drop into both eyes 4 (four) times daily.  . fluconazole (DIFLUCAN) 200 MG tablet Take 1 tablet (200 mg total) by mouth daily.  Marland Kitchen gabapentin (NEURONTIN) 300 MG capsule TAKE 1 CAPSULE BY MOUTH AT BEDTIME.  . isosorbide mononitrate (IMDUR) 30 MG 24 hr tablet Take 1 tablet (30 mg total) by mouth daily.  Marland Kitchen levothyroxine (SYNTHROID, LEVOTHROID) 150 MCG tablet Take 1 tablet (150 mcg total) by mouth daily. (Patient taking differently: Take 150 mcg  by mouth daily. )  . loperamide (IMODIUM) 2 MG capsule Take 1 capsule (2 mg total) by mouth as needed for diarrhea or loose stools.  Marland Kitchen LORazepam (ATIVAN) 0.5 MG tablet Take 1 tablet (0.5 mg total) by mouth every 8 (eight) hours as needed (nausea).  . loteprednol (ALREX) 0.2 % SUSP Place 1 drop into both eyes 4 (four) times daily.  . magic mouthwash SOLN Take 5 mLs by mouth 4 (four) times daily as needed for mouth pain.  . meloxicam (MOBIC) 15 MG tablet Take 1 tablet (15 mg total) by mouth daily.  . midodrine (PROAMATINE) 10 MG tablet TAKE 1 TABLET BY MOUTH 3 TIMES DAILY.  . montelukast (SINGULAIR) 10 MG tablet TAKE 1 TABLET BY MOUTH DAILY.  . nitroGLYCERIN (NITROSTAT) 0.4 MG SL tablet Place 0.4 mg under the tongue every 5 (five) minutes as needed for chest pain. Reported on 03/30/2015  . nystatin (MYCOSTATIN/NYSTOP) powder Apply topically 2 (two) times daily.  Marland Kitchen PRESCRIPTION MEDICATION Pt gets [facility] at [facility].  . ranitidine (ZANTAC) 300 MG tablet Take 1 to 2 tablets daily for heartburn & reflux  . senna (SENOKOT) 8.6 MG tablet Take 1 tablet by mouth at bedtime.  . traMADol (ULTRAM) 50 MG tablet Take 1 to 2 tablets by mouth 4 times a day as needed for pain.  . vitamin C (ASCORBIC ACID) 250 MG tablet Take 250 mg by mouth daily.  Marland Kitchen azelastine (ASTELIN) 0.1 % nasal spray Place 2 sprays into both nostrils 2 (two) times daily. Use  in each nostril as directed  . ferrous sulfate 325 (65 FE) MG tablet Take 1 tablet daily or as directed  . predniSONE (DELTASONE) 20 MG tablet 1 tab 3 x day for 3 days, then 1 tab 2 x day for 3 days, then 1 tab 1 x day for 5 days   Allergies  Allergen Reactions  . Codeine Anaphylaxis  . Latex Shortness Of Breath    Adhesive products   . Other Other (See Comments)    Onion, chocolate causes migraines  . Onion Other (See Comments)    Causes migraine headaches  . Zyprexa [Olanzapine] Other (See Comments)    Confusion , dizzy,unsteady  . Adhesive [Tape] Other (See Comments)     blisters  . Hydrocodone Rash    With extreme itching  . Iodinated Diagnostic Agents Hives, Itching and Rash    Happened 60 years ago Stage IV kidney function  . Sulfa Antibiotics Itching   Past Medical History:  Diagnosis Date  . Anemia   . Anginal pain ([place])    used NTG x 2 May 31 and 06/15/13   . Anxiety   . Arthritis   . B12 deficiency 12/04/2014  . Breast cancer ([place])   . CKD (chronic kidney disease) stage 3, GFR 30-59 ml/min   . Complication of anesthesia   . COPD (chronic obstructive pulmonary disease) ([place])   . Cryptococcal pneumonitis ([place]) 11/22/2015  . Depression   . Dizziness   . Dyspnea   . Fibromyalgia   . Fibromyalgia   . GERD (gastroesophageal reflux disease)   . Headache(784.0)   . Heart murmur   . Hemoptysis 10/21/2015  . History of blood transfusion    last one May 12   . Hx of cardiovascular stress test    LexiScan with low level exercise Myoview (02/2013): No ischemia, EF 72%; normal study  . Hx of echocardiogram    a.  Echocardiogram (12/26/2012): EF 79-89%, grade 1 diastolic dysfunction;   b.  Echocardiogram (02/2013): EF 55-60%, no WMA, trivial effusion  . Hyperkalemia   . Hyponatremia   . Hypothyroidism   . Mucositis   . Multiple myeloma   . Myocardial infarction [facility])    in past, patient was unaware.   . Neuropathy   . Pain in joint, pelvic region and thigh 07/07/2015  . PONV (postoperative nausea and vomiting) 2008   after mastestomy   Past Surgical History:  Procedure Laterality Date  . ABDOMINAL HYSTERECTOMY  1981  . AV FISTULA PLACEMENT Left 06/19/2013   Procedure: CREATION OF LEFT ARM ARTERIOVENOUS (AV) FISTULA ;  Surgeon: Angelia Mould, MD;  Location: [place];  Service: Vascular;  Laterality: Left;  . BREAST RECONSTRUCTION    . BREAST SURGERY    . CATARACT EXTRACTION, BILATERAL    . CHOLECYSTECTOMY  1971  . EYE SURGERY Bilateral    lens implant  . history of Port removal    . LUNG BIOPSY  10/21/2015  . MASTECTOMY Left 2008    . PORTACATH PLACEMENT  12/2012   has had 2  . Status post stem cell transplant on September 28, 2008.     Review of Systems  10 point systems review negative except as above.    Objective:   Physical Exam  BP 122/64   Pulse 72   Temp 97 F (36.1 C)   Resp 16   Ht 5' 5.5" (1.664 m)   Wt 160 lb (72.6 kg)   BMI 26.22 kg/m   HEENT -  WNL. Neck - supple.  Chest - Clear equal BS. Cor - Nl HS. RRR w/o sig MGR. PP 1(+). No edema. MS- FROM w/o deformities.  Gait Nl. Neuro -  Nl w/o focal abnormalities.    Assessment & Plan:    1. Orthostatic hypotension   2. Depression, controlled  - amphetamine-dextroamphetamine (ADDERALL) 20 MG tablet; Take 1/2 to 1 tablet 1 or 2 x / day for focusing/concentration & Depression  Dispense: 60 tablet; Refill: 0

## 2016-07-06 ENCOUNTER — Other Ambulatory Visit: Payer: Medicare Other

## 2016-07-09 ENCOUNTER — Ambulatory Visit (HOSPITAL_BASED_OUTPATIENT_CLINIC_OR_DEPARTMENT_OTHER): Payer: Medicare Other

## 2016-07-09 ENCOUNTER — Other Ambulatory Visit (HOSPITAL_BASED_OUTPATIENT_CLINIC_OR_DEPARTMENT_OTHER): Payer: Medicare Other

## 2016-07-09 ENCOUNTER — Ambulatory Visit: Payer: Medicare Other

## 2016-07-09 VITALS — BP 126/61 | HR 65 | Temp 98.3°F | Resp 16

## 2016-07-09 DIAGNOSIS — Z452 Encounter for adjustment and management of vascular access device: Secondary | ICD-10-CM

## 2016-07-09 DIAGNOSIS — Z853 Personal history of malignant neoplasm of breast: Secondary | ICD-10-CM

## 2016-07-09 DIAGNOSIS — C9000 Multiple myeloma not having achieved remission: Secondary | ICD-10-CM

## 2016-07-09 DIAGNOSIS — C9001 Multiple myeloma in remission: Secondary | ICD-10-CM

## 2016-07-09 LAB — CBC WITH DIFFERENTIAL/PLATELET
BASO%: 1.5 % (ref 0.0–2.0)
BASOS ABS: 0.1 10*3/uL (ref 0.0–0.1)
EOS%: 2.8 % (ref 0.0–7.0)
Eosinophils Absolute: 0.1 10*3/uL (ref 0.0–0.5)
HEMATOCRIT: 32.7 % — AB (ref 34.8–46.6)
HGB: 10.9 g/dL — ABNORMAL LOW (ref 11.6–15.9)
LYMPH%: 16.9 % (ref 14.0–49.7)
MCH: 34.4 pg — AB (ref 25.1–34.0)
MCHC: 33.3 g/dL (ref 31.5–36.0)
MCV: 103.3 fL — ABNORMAL HIGH (ref 79.5–101.0)
MONO#: 0.6 10*3/uL (ref 0.1–0.9)
MONO%: 17 % — AB (ref 0.0–14.0)
NEUT#: 2.2 10*3/uL (ref 1.5–6.5)
NEUT%: 61.8 % (ref 38.4–76.8)
Platelets: 45 10*3/uL — ABNORMAL LOW (ref 145–400)
RBC: 3.17 10*6/uL — AB (ref 3.70–5.45)
RDW: 25.2 % — ABNORMAL HIGH (ref 11.2–14.5)
WBC: 3.5 10*3/uL — ABNORMAL LOW (ref 3.9–10.3)
lymph#: 0.6 10*3/uL — ABNORMAL LOW (ref 0.9–3.3)

## 2016-07-09 LAB — COMPREHENSIVE METABOLIC PANEL
ALT: 17 U/L (ref 0–55)
AST: 14 U/L (ref 5–34)
Albumin: 3.5 g/dL (ref 3.5–5.0)
Alkaline Phosphatase: 87 U/L (ref 40–150)
Anion Gap: 12 mEq/L — ABNORMAL HIGH (ref 3–11)
BUN: 30.5 mg/dL — AB (ref 7.0–26.0)
CALCIUM: 9.3 mg/dL (ref 8.4–10.4)
CHLORIDE: 107 meq/L (ref 98–109)
CO2: 23 mEq/L (ref 22–29)
Creatinine: 2.8 mg/dL — ABNORMAL HIGH (ref 0.6–1.1)
EGFR: 16 mL/min/{1.73_m2} — ABNORMAL LOW (ref >90)
Glucose: 109 mg/dl (ref 70–140)
POTASSIUM: 4 meq/L (ref 3.5–5.1)
SODIUM: 141 meq/L (ref 136–145)
Total Bilirubin: 1.35 mg/dL — ABNORMAL HIGH (ref 0.20–1.20)
Total Protein: 5.8 g/dL — ABNORMAL LOW (ref 6.4–8.3)

## 2016-07-09 MED ORDER — HEPARIN SOD (PORK) LOCK FLUSH 100 UNIT/ML IV SOLN
500.0000 [IU] | Freq: Once | INTRAVENOUS | Status: AC
  Administered 2016-07-09: 500 [IU] via INTRAVENOUS
  Filled 2016-07-09: qty 5

## 2016-07-09 MED ORDER — SODIUM CHLORIDE 0.9 % IJ SOLN
10.0000 mL | Freq: Once | INTRAMUSCULAR | Status: AC
  Administered 2016-07-09: 10 mL via INTRAVENOUS
  Filled 2016-07-09: qty 10

## 2016-07-09 MED ORDER — SODIUM CHLORIDE 0.9 % IJ SOLN
10.0000 mL | INTRAMUSCULAR | Status: DC | PRN
  Administered 2016-07-09: 10 mL
  Filled 2016-07-09: qty 10

## 2016-07-09 NOTE — Patient Instructions (Signed)

## 2016-07-09 NOTE — Progress Notes (Signed)
Hold this week's treatment, because of Platelets = 45, per Dr. Julien Nordmann.  Patient to return for next treatment on 07/23/16, per Dr. Julien Nordmann.  Patient verified understanding.

## 2016-07-10 ENCOUNTER — Ambulatory Visit: Payer: Medicare Other

## 2016-07-16 ENCOUNTER — Ambulatory Visit (INDEPENDENT_AMBULATORY_CARE_PROVIDER_SITE_OTHER): Payer: Medicare Other | Admitting: Internal Medicine

## 2016-07-16 ENCOUNTER — Encounter: Payer: Self-pay | Admitting: Internal Medicine

## 2016-07-16 VITALS — BP 107/60 | HR 68 | Temp 98.3°F | Ht 65.5 in | Wt 158.0 lb

## 2016-07-16 DIAGNOSIS — B450 Pulmonary cryptococcosis: Secondary | ICD-10-CM | POA: Diagnosis not present

## 2016-07-16 DIAGNOSIS — C9000 Multiple myeloma not having achieved remission: Secondary | ICD-10-CM | POA: Diagnosis not present

## 2016-07-16 DIAGNOSIS — J189 Pneumonia, unspecified organism: Secondary | ICD-10-CM

## 2016-07-16 NOTE — Progress Notes (Signed)
Subjective:    Patient ID: Nancy Norris, female    DOB: [birth year], 76 y.o.   MRN: [id number]  HPI Here for follow up of cryptococcal pneumonia.  I saw her as a new patient on 11/27 and she was already on 200 mg fluconazole (renally dosed) for over 1 month.  She was getting chemotherapy for multiple myeloma but has since seen Dr. Julien Nordmann who does not feel she needs chemo at that time.  She had a repeat CT scan of her chest and the area of concern has decreased from 3.8x3.0 to 2.1x1.7 and again prior to the last visit down to 1.5 x 1.8.   She since has started chemotherapy for her multiple myeloma with Carfilzomib, Cytoxan and Decadron.  She has continued on fluconazole 200 mg daily.  She plans to continue on chemotherapy in place of considering dialysis.  She does not plan to ever consent to dialysis.  She did not have a CT chest prior to this visit since Dr. Julien Nordmann did not need one for his purposes.   She is having no associated rashes, diarrhea.     Review of Systems  Constitutional: Negative for chills and fever.  Respiratory: Negative for cough and shortness of breath.   Gastrointestinal: Negative for diarrhea.  Skin: Negative for rash.       Objective:   Physical Exam  Constitutional: She appears well-developed and well-nourished.  Eyes: No scleral icterus.  Cardiovascular: Normal rate, regular rhythm and normal heart sounds.   No murmur heard. Pulmonary/Chest: Effort normal and breath sounds normal. No respiratory distress. She has no wheezes.  Skin: No rash noted.    SH: no tobacco      Assessment & Plan:

## 2016-07-16 NOTE — Assessment & Plan Note (Signed)
She is doing well on the fluconazole and no changes indicated.

## 2016-07-16 NOTE — Assessment & Plan Note (Signed)
I am going to repeat her CT scan in 3 months prior to her next visit

## 2016-07-23 ENCOUNTER — Ambulatory Visit (HOSPITAL_BASED_OUTPATIENT_CLINIC_OR_DEPARTMENT_OTHER): Payer: Medicare Other

## 2016-07-23 ENCOUNTER — Ambulatory Visit: Payer: Medicare Other

## 2016-07-23 ENCOUNTER — Ambulatory Visit (HOSPITAL_BASED_OUTPATIENT_CLINIC_OR_DEPARTMENT_OTHER): Payer: Medicare Other | Admitting: Internal Medicine

## 2016-07-23 ENCOUNTER — Telehealth: Payer: Self-pay | Admitting: Internal Medicine

## 2016-07-23 ENCOUNTER — Other Ambulatory Visit (HOSPITAL_BASED_OUTPATIENT_CLINIC_OR_DEPARTMENT_OTHER): Payer: Medicare Other

## 2016-07-23 ENCOUNTER — Encounter: Payer: Self-pay | Admitting: Internal Medicine

## 2016-07-23 VITALS — BP 137/64 | HR 71 | Temp 98.5°F | Resp 20 | Ht 65.5 in | Wt 162.3 lb

## 2016-07-23 DIAGNOSIS — Z5111 Encounter for antineoplastic chemotherapy: Secondary | ICD-10-CM

## 2016-07-23 DIAGNOSIS — Z853 Personal history of malignant neoplasm of breast: Secondary | ICD-10-CM

## 2016-07-23 DIAGNOSIS — C9001 Multiple myeloma in remission: Secondary | ICD-10-CM

## 2016-07-23 DIAGNOSIS — C9000 Multiple myeloma not having achieved remission: Secondary | ICD-10-CM

## 2016-07-23 DIAGNOSIS — Z5112 Encounter for antineoplastic immunotherapy: Secondary | ICD-10-CM | POA: Diagnosis not present

## 2016-07-23 DIAGNOSIS — Z86000 Personal history of in-situ neoplasm of breast: Secondary | ICD-10-CM | POA: Diagnosis not present

## 2016-07-23 DIAGNOSIS — D696 Thrombocytopenia, unspecified: Secondary | ICD-10-CM

## 2016-07-23 DIAGNOSIS — D630 Anemia in neoplastic disease: Secondary | ICD-10-CM

## 2016-07-23 LAB — CBC WITH DIFFERENTIAL/PLATELET
BASO%: 0.5 % (ref 0.0–2.0)
BASOS ABS: 0 10*3/uL (ref 0.0–0.1)
EOS%: 3.1 % (ref 0.0–7.0)
Eosinophils Absolute: 0.1 10*3/uL (ref 0.0–0.5)
HCT: 30 % — ABNORMAL LOW (ref 34.8–46.6)
HGB: 9.9 g/dL — ABNORMAL LOW (ref 11.6–15.9)
LYMPH#: 0.9 10*3/uL (ref 0.9–3.3)
LYMPH%: 22.9 % (ref 14.0–49.7)
MCH: 34.4 pg — AB (ref 25.1–34.0)
MCHC: 33.1 g/dL (ref 31.5–36.0)
MCV: 103.9 fL — ABNORMAL HIGH (ref 79.5–101.0)
MONO#: 0.7 10*3/uL (ref 0.1–0.9)
MONO%: 19.3 % — ABNORMAL HIGH (ref 0.0–14.0)
NEUT#: 2.1 10*3/uL (ref 1.5–6.5)
NEUT%: 54.2 % (ref 38.4–76.8)
PLATELETS: 74 10*3/uL — AB (ref 145–400)
RBC: 2.89 10*6/uL — AB (ref 3.70–5.45)
RDW: 24.7 % — ABNORMAL HIGH (ref 11.2–14.5)
WBC: 3.9 10*3/uL (ref 3.9–10.3)

## 2016-07-23 LAB — COMPREHENSIVE METABOLIC PANEL
ALT: 18 U/L (ref 0–55)
ANION GAP: 10 meq/L (ref 3–11)
AST: 19 U/L (ref 5–34)
Albumin: 3.6 g/dL (ref 3.5–5.0)
Alkaline Phosphatase: 117 U/L (ref 40–150)
BUN: 24.6 mg/dL (ref 7.0–26.0)
CALCIUM: 9 mg/dL (ref 8.4–10.4)
CO2: 21 meq/L — AB (ref 22–29)
Chloride: 111 mEq/L — ABNORMAL HIGH (ref 98–109)
Creatinine: 2.6 mg/dL — ABNORMAL HIGH (ref 0.6–1.1)
EGFR: 17 mL/min/{1.73_m2} — ABNORMAL LOW (ref >90)
Glucose: 103 mg/dl (ref 70–140)
POTASSIUM: 4 meq/L (ref 3.5–5.1)
Sodium: 142 mEq/L (ref 136–145)
Total Bilirubin: 0.73 mg/dL (ref 0.20–1.20)
Total Protein: 5.7 g/dL — ABNORMAL LOW (ref 6.4–8.3)

## 2016-07-23 MED ORDER — DEXTROSE 5 % IV SOLN
27.0000 mg/m2 | Freq: Once | INTRAVENOUS | Status: AC
  Administered 2016-07-23: 50 mg via INTRAVENOUS
  Filled 2016-07-23: qty 25

## 2016-07-23 MED ORDER — ONDANSETRON HCL 8 MG PO TABS
8.0000 mg | ORAL_TABLET | Freq: Once | ORAL | Status: AC
  Administered 2016-07-23: 8 mg via ORAL

## 2016-07-23 MED ORDER — ZOLEDRONIC ACID 4 MG/5ML IV CONC
3.0000 mg | Freq: Once | INTRAVENOUS | Status: AC
  Administered 2016-07-23: 3 mg via INTRAVENOUS
  Filled 2016-07-23: qty 3.75

## 2016-07-23 MED ORDER — HEPARIN SOD (PORK) LOCK FLUSH 100 UNIT/ML IV SOLN
500.0000 [IU] | Freq: Once | INTRAVENOUS | Status: AC | PRN
  Administered 2016-07-23: 500 [IU]
  Filled 2016-07-23: qty 5

## 2016-07-23 MED ORDER — ONDANSETRON HCL 8 MG PO TABS
ORAL_TABLET | ORAL | Status: AC
  Filled 2016-07-23: qty 1

## 2016-07-23 MED ORDER — SODIUM CHLORIDE 0.9 % IJ SOLN
10.0000 mL | INTRAMUSCULAR | Status: DC | PRN
  Administered 2016-07-23: 10 mL
  Filled 2016-07-23: qty 10

## 2016-07-23 MED ORDER — SODIUM CHLORIDE 0.9 % IV SOLN
225.0000 mg/m2 | Freq: Once | INTRAVENOUS | Status: AC
  Administered 2016-07-23: 420 mg via INTRAVENOUS
  Filled 2016-07-23: qty 21

## 2016-07-23 MED ORDER — DEXAMETHASONE SODIUM PHOSPHATE 10 MG/ML IJ SOLN
INTRAMUSCULAR | Status: AC
  Filled 2016-07-23: qty 1

## 2016-07-23 MED ORDER — SODIUM CHLORIDE 0.9 % IV SOLN
Freq: Once | INTRAVENOUS | Status: AC
  Administered 2016-07-23: 10:00:00 via INTRAVENOUS

## 2016-07-23 MED ORDER — DEXAMETHASONE SODIUM PHOSPHATE 10 MG/ML IJ SOLN
10.0000 mg | Freq: Once | INTRAMUSCULAR | Status: AC
  Administered 2016-07-23: 10 mg via INTRAVENOUS

## 2016-07-23 MED ORDER — HEPARIN SOD (PORK) LOCK FLUSH 100 UNIT/ML IV SOLN
500.0000 [IU] | Freq: Once | INTRAVENOUS | Status: DC | PRN
  Filled 2016-07-23: qty 5

## 2016-07-23 NOTE — Patient Instructions (Signed)
Implanted Port Insertion, Care After °This sheet gives you information about how to care for yourself after your procedure. Your health care provider may also give you more specific instructions. If you have problems or questions, contact your health care provider. °What can I expect after the procedure? °After your procedure, it is common to have: °· Discomfort at the port insertion site. °· Bruising on the skin over the port. This should improve over 3-4 days. ° °Follow these instructions at home: °Port care °· After your port is placed, you will get a manufacturer's information card. The card has information about your port. Keep this card with you at all times. °· Take care of the port as told by your health care provider. Ask your health care provider if you or a family member can get training for taking care of the port at home. A home health care nurse may also take care of the port. °· Make sure to remember what type of port you have. °Incision care °· Follow instructions from your health care provider about how to take care of your port insertion site. Make sure you: °? Wash your hands with soap and water before you change your bandage (dressing). If soap and water are not available, use hand sanitizer. °? Change your dressing as told by your health care provider. °? Leave stitches (sutures), skin glue, or adhesive strips in place. These skin closures may need to stay in place for 2 weeks or longer. If adhesive strip edges start to loosen and curl up, you may trim the loose edges. Do not remove adhesive strips completely unless your health care provider tells you to do that. °· Check your port insertion site every day for signs of infection. Check for: °? More redness, swelling, or pain. °? More fluid or blood. °? Warmth. °? Pus or a bad smell. °General instructions °· Do not take baths, swim, or use a hot tub until your health care provider approves. °· Do not lift anything that is heavier than 10 lb (4.5  kg) for a week, or as told by your health care provider. °· Ask your health care provider when it is okay to: °? Return to work or school. °? Resume usual physical activities or sports. °· Do not drive for 24 hours if you were given a medicine to help you relax (sedative). °· Take over-the-counter and prescription medicines only as told by your health care provider. °· Wear a medical alert bracelet in case of an emergency. This will tell any health care providers that you have a port. °· Keep all follow-up visits as told by your health care provider. This is important. °Contact a health care provider if: °· You cannot flush your port with saline as directed, or you cannot draw blood from the port. °· You have a fever or chills. °· You have more redness, swelling, or pain around your port insertion site. °· You have more fluid or blood coming from your port insertion site. °· Your port insertion site feels warm to the touch. °· You have pus or a bad smell coming from the port insertion site. °Get help right away if: °· You have chest pain or shortness of breath. °· You have bleeding from your port that you cannot control. °Summary °· Take care of the port as told by your health care provider. °· Change your dressing as told by your health care provider. °· Keep all follow-up visits as told by your health care provider. °  This information is not intended to replace advice given to you by your health care provider. Make sure you discuss any questions you have with your health care provider. °Document Released: 10/22/2012 Document Revised: 11/23/2015 Document Reviewed: 11/23/2015 °Elsevier Interactive Patient Education © 2017 [employer] ° °

## 2016-07-23 NOTE — Progress Notes (Signed)
Per Dr. Julien Nordmann, okay for patient to receive Zometa today with Crt of 2.6.

## 2016-07-23 NOTE — Telephone Encounter (Signed)
Scheduled appts per 7/9 los - patient to get new schedule in the treatment area.

## 2016-07-23 NOTE — Progress Notes (Signed)
[facility] [phone]   [phone]  OFFICE PROGRESS NOTE  [name], [place] [address]  DIAGNOSIS:  1. 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 Recurrent multiple myeloma initially diagnosed as a smoldering myeloma at [facility] in 2002. 3. Ductal carcinoma in situ status post mastectomy with sentinel lymph node biopsy in October 2008.  PRIOR THERAPY: 1. Status post treatment with tamoxifen from November 2008 through February 2009, discontinued secondary to intolerance. 2. Status post 3 cycles of chemotherapy with Revlimid and Decadron followed by 1 cycle of Decadron only with mild response. 3. Status post 2 cycles of systemic chemotherapy with Velcade, Doxil and Decadron discontinued secondary to significant peripheral neuropathy. Last dose was given May 2010 at [facility]. 4. Status post autologous peripheral blood stem cell transplant on October 01, 2008 at [facility] under the care of Dr. Phyllis Ginger.  5. Systemic chemotherapy with Carfilzomib initially was 20 mg/M2 and will be increased after cycle #1 to 36 mg/M2 on days 1, 2, 8, 9, 15 and 16 every 4 weeks in addition to Cytoxan 300 mg/M2 and Decadron 40 mg by mouth weekly basis, status post 4 cycles. First cycle on 12/29/2012. 6. She resumed chemotherapy with Carfilzomib, Cytoxan, and Decadron on 05/03/2014. Status post 6 cycles. 7. systemic chemotherapy again with Carfilzomib, Cytoxan and Decadron on 07/11/2015. Status post 3 cycles. 8. She completed a course of treatment with Diflucan 200 mg by mouth daily for around 6 weeks for cryptococcal infection of the lung.  CURRENT THERAPY: Resuming treatment again with Carfilzomib, Cytoxan and Decadron, first cycle 04/23/2016. Status post 3 cycles  INTERVAL HISTORY: Nancy Norris 76 y.o. female returns to the clinic today for follow-up visit. The  patient is feeling fine today was no specific complaints. She denied having any chest pain, shortness of breath, cough or hemoptysis. She denied having any fever or chills. She is tolerating her current treatment with Carfilzomib, Cytoxan Decadron fairly well. The patient denied having any significant weight loss or night sweats. She has no nausea, vomiting, diarrhea or constipation. She is here today for evaluation before starting cycle #4 of the new course of her treatment.  MEDICAL HISTORY: Past Medical History:  Diagnosis Date  . Anemia   . Anginal pain ([place])    used NTG x 2 May 31 and 06/15/13   . Anxiety   . Arthritis   . B12 deficiency 12/04/2014  . Breast cancer ([place])   . CKD (chronic kidney disease) stage 3, GFR 30-59 ml/min   . Complication of anesthesia   . COPD (chronic obstructive pulmonary disease) ([place])   . Cryptococcal pneumonitis ([place]) 11/22/2015  . Depression   . Dizziness   . Dyspnea   . Fibromyalgia   . Fibromyalgia   . GERD (gastroesophageal reflux disease)   . Headache(784.0)   . Heart murmur   . Hemoptysis 10/21/2015  . History of blood transfusion    last one May 12   . Hx of cardiovascular stress test    LexiScan with low level exercise Myoview (02/2013): No ischemia, EF 72%; normal study  . Hx of echocardiogram    a.  Echocardiogram (12/26/2012): EF 17-51%, grade 1 diastolic dysfunction;   b.  Echocardiogram (02/2013): EF 55-60%, no WMA, trivial effusion  . Hyperkalemia   . Hyponatremia   . Hypothyroidism   . Mucositis   . Multiple myeloma   .  Myocardial infarction [facility])    in past, patient was unaware.   . Neuropathy   . Pain in joint, pelvic region and thigh 07/07/2015  . PONV (postoperative nausea and vomiting) 2008   after mastestomy    ALLERGIES:  is allergic to codeine; latex; other; onion; zyprexa [olanzapine]; adhesive [tape]; hydrocodone; iodinated diagnostic agents; and sulfa antibiotics.  MEDICATIONS:  Current Outpatient Prescriptions    Medication Sig Dispense Refill  . acetaminophen (TYLENOL) 325 MG tablet Take 650 mg by mouth every 6 (six) hours as needed for mild pain or moderate pain.     Marland Kitchen albuterol (PROVENTIL HFA;VENTOLIN HFA) 108 (90 Base) MCG/ACT inhaler Inhale 1-2 puffs into the lungs every 6 (six) hours as needed for wheezing or shortness of breath (cough). 1 Inhaler 3  . amphetamine-dextroamphetamine (ADDERALL) 20 MG tablet Take 1/2 to 1 tablet 1 or 2 x / day for focusing/concentration & Depression 60 tablet 0  . buPROPion (WELLBUTRIN XL) 300 MG 24 hr tablet TAKE 1 TABLET BY MOUTH EACH MORNING FOR MOOD AND DEPRESSION. 90 tablet 1  . butalbital-acetaminophen-caffeine (FIORICET, ESGIC) 50-325-40 MG tablet TAKE 1 OR 2 TABLETS BY MOUTH EVERY 4 HOURS AS NEEDED FOR PAIN OR HEADACHE. 50 tablet 3  . Cholecalciferol 4000 UNITS TABS Take 4,000 Units by mouth daily.    . citalopram (CELEXA) 40 MG tablet TAKE 1 TABLET BY MOUTH DAILY OR AS DIRECTED 90 tablet 1  . cromolyn (OPTICROM) 4 % ophthalmic solution Place 1 drop into both eyes 4 (four) times daily.    Marland Kitchen gabapentin (NEURONTIN) 300 MG capsule TAKE 1 CAPSULE BY MOUTH AT BEDTIME. 90 capsule 1  . isosorbide mononitrate (IMDUR) 30 MG 24 hr tablet Take 1 tablet (30 mg total) by mouth daily. 90 tablet 3  . levothyroxine (SYNTHROID, LEVOTHROID) 150 MCG tablet Take 1 tablet (150 mcg total) by mouth daily. (Patient taking differently: Take 150 mcg by mouth daily. ) 90 tablet 1  . loteprednol (ALREX) 0.2 % SUSP Place 1 drop into both eyes 4 (four) times daily. 1 Bottle PRN  . magic mouthwash SOLN Take 5 mLs by mouth 4 (four) times daily as needed for mouth pain. 240 mL 1  . meloxicam (MOBIC) 15 MG tablet Take 1 tablet (15 mg total) by mouth daily. 90 tablet 3  . midodrine (PROAMATINE) 10 MG tablet TAKE 1 TABLET BY MOUTH 3 TIMES DAILY. 270 tablet 3  . montelukast (SINGULAIR) 10 MG tablet TAKE 1 TABLET BY MOUTH DAILY. 30 tablet 2  . PRESCRIPTION MEDICATION Pt gets chemo at [facility].    .  ranitidine (ZANTAC) 300 MG tablet Take 1 to 2 tablets daily for heartburn & reflux 180 tablet 1  . senna (SENOKOT) 8.6 MG tablet Take 1 tablet by mouth at bedtime.    . traMADol (ULTRAM) 50 MG tablet Take 1 to 2 tablets by mouth 4 times a day as needed for pain. 180 tablet 0  . vitamin C (ASCORBIC ACID) 250 MG tablet Take 250 mg by mouth daily.    Marland Kitchen azelastine (ASTELIN) 0.1 % nasal spray Place 2 sprays into both nostrils 2 (two) times daily. Use in each nostril as directed 30 mL 2  . ferrous sulfate 325 (65 FE) MG tablet Take 1 tablet daily or as directed 90 tablet 1  . fluconazole (DIFLUCAN) 200 MG tablet Take 1 tablet (200 mg total) by mouth daily. 30 tablet 5  . loperamide (IMODIUM) 2 MG capsule Take 1 capsule (2 mg total) by mouth as needed for  diarrhea or loose stools. (Patient not taking: Reported on 07/23/2016) 30 capsule 2  . nitroGLYCERIN (NITROSTAT) 0.4 MG SL tablet Place 0.4 mg under the tongue every 5 (five) minutes as needed for chest pain. Reported on 03/30/2015    . nystatin (MYCOSTATIN/NYSTOP) powder Apply topically 2 (two) times daily. 30 g 0   No current facility-administered medications for this visit.     SURGICAL HISTORY:  Past Surgical History:  Procedure Laterality Date  . ABDOMINAL HYSTERECTOMY  1981  . AV FISTULA PLACEMENT Left 06/19/2013   Procedure: CREATION OF LEFT ARM ARTERIOVENOUS (AV) FISTULA ;  Surgeon: Angelia Mould, MD;  Location: [facility];  Service: Vascular;  Laterality: Left;  . BREAST RECONSTRUCTION    . BREAST SURGERY    . CATARACT EXTRACTION, BILATERAL    . CHOLECYSTECTOMY  1971  . EYE SURGERY Bilateral    lens implant  . history of Port removal    . LUNG BIOPSY  10/21/2015  . MASTECTOMY Left 2008  . PORTACATH PLACEMENT  12/2012   has had 2  . Status post stem cell transplant on September 28, 2008.      REVIEW OF SYSTEMS:  A comprehensive review of systems was negative except for: Constitutional: positive for fatigue   PHYSICAL EXAMINATION:  General appearance: alert, cooperative, fatigued and no distress Head: Normocephalic, without obvious abnormality, atraumatic Neck: no adenopathy, no JVD, supple, symmetrical, trachea midline and thyroid not enlarged, symmetric, no tenderness/mass/nodules Lymph nodes: Cervical, supraclavicular, and axillary nodes normal. Resp: clear to auscultation bilaterally Back: symmetric, no curvature. ROM normal. No CVA tenderness. Cardio: regular rate and rhythm, S1, S2 normal, no murmur, click, rub or gallop GI: soft, non-tender; bowel sounds normal; no masses,  no organomegaly Extremities: extremities normal, atraumatic, no cyanosis or edema  ECOG PERFORMANCE STATUS: 1 - Symptomatic but completely ambulatory  Blood pressure 137/64, pulse 71, temperature 98.5 F (36.9 C), temperature source Oral, resp. rate 20, height 5' 5.5" (1.664 m), weight 162 lb 4.8 oz (73.6 kg), SpO2 99 %.  LABORATORY DATA: Lab Results  Component Value Date   WBC 3.9 07/23/2016   HGB 9.9 (L) 07/23/2016   HCT 30.0 (L) 07/23/2016   MCV 103.9 (H) 07/23/2016   PLT 74 (L) 07/23/2016      Chemistry      Component Value Date/Time   NA 142 07/23/2016 0827   K 4.0 07/23/2016 0827   CL 106 02/20/2016 1213   CL 105 03/19/2012 0811   CO2 21 (L) 07/23/2016 0827   BUN 24.6 07/23/2016 0827   CREATININE 2.6 (H) 07/23/2016 0827      Component Value Date/Time   CALCIUM 9.0 07/23/2016 0827   ALKPHOS 117 07/23/2016 0827   AST 19 07/23/2016 0827   ALT 18 07/23/2016 0827   BILITOT 0.73 07/23/2016 0827       RADIOGRAPHIC STUDIES: No results found.  ASSESSMENT AND PLAN:  This is a very pleasant 76 years old white female with relapsed multiple myeloma status post several chemotherapy regimens and she is currently on treatment with systemic chemotherapy with Carfilzomib, Cytoxan and Decadron status post 3 cycles. She tolerated the last cycle of her treatment well except for thrombocytopenia. I recommended for the patient to  proceed with cycle #4 today. For the thrombocytopenia, I will continue to monitor her platelets count closely. For the chronic renal insufficiency, she is followed by nephrology. I would see the patient back for follow-up visit in 4 weeks for evaluation after repeating myeloma panel. She was  advised to call immediately if she has any concerning symptoms in the interval. The patient voices understanding of current disease status and treatment options and is in agreement with the current care plan. All questions were answered. The patient knows to call the clinic with any problems, questions or concerns. We can certainly see the patient much sooner if necessary.  Disclaimer: This note was dictated with voice recognition software. Similar sounding words can inadvertently be transcribed and may not be corrected upon review.

## 2016-07-23 NOTE — Progress Notes (Signed)
PErDr Julien Nordmann it is okay to treat pt today with chemotherapy and todays CBC/diff and CMP results.

## 2016-07-23 NOTE — Patient Instructions (Addendum)
[facility] Discharge Instructions for Patients Receiving Chemotherapy  Today you received the following chemotherapy agents: Cytoxan and Kyprolis   To help prevent nausea and vomiting after your treatment, we encourage you to take your nausea medication as directed.    If you develop nausea and vomiting that is not controlled by your nausea medication, call the clinic.   BELOW ARE SYMPTOMS THAT SHOULD BE REPORTED IMMEDIATELY:  *FEVER GREATER THAN 100.5 F  *CHILLS WITH OR WITHOUT FEVER  NAUSEA AND VOMITING THAT IS NOT CONTROLLED WITH YOUR NAUSEA MEDICATION  *UNUSUAL SHORTNESS OF BREATH  *UNUSUAL BRUISING OR BLEEDING  TENDERNESS IN MOUTH AND THROAT WITH OR WITHOUT PRESENCE OF ULCERS  *URINARY PROBLEMS  *BOWEL PROBLEMS  UNUSUAL RASH Items with * indicate a potential emergency and should be followed up as soon as possible.  Feel free to call the clinic you have any questions or concerns. The clinic phone number is [phone].  Please show the [facility] at check-in to the Emergency Department and triage nurse.  Zoledronic Acid injection (Hypercalcemia, Oncology) What is this medicine? ZOLEDRONIC ACID (ZOE le dron ik AS id) lowers the amount of calcium loss from bone. It is used to treat too much calcium in your blood from cancer. It is also used to prevent complications of cancer that has spread to the bone. This medicine may be used for other purposes; ask your health care provider or pharmacist if you have questions. COMMON BRAND NAME(S): Zometa What should I tell my health care provider before I take this medicine? They need to know if you have any of these conditions: -aspirin-sensitive asthma -cancer, especially if you are receiving medicines used to treat cancer -dental disease or wear dentures -infection -kidney disease -receiving corticosteroids like dexamethasone or prednisone -an unusual or allergic reaction to zoledronic acid, other  medicines, foods, dyes, or preservatives -pregnant or trying to get pregnant -breast-feeding How should I use this medicine? This medicine is for infusion into a vein. It is given by a health care professional in a hospital or clinic setting. Talk to your pediatrician regarding the use of this medicine in children. Special care may be needed. Overdosage: If you think you have taken too much of this medicine contact a poison control center or emergency room at once. NOTE: This medicine is only for you. Do not share this medicine with others. What if I miss a dose? It is important not to miss your dose. Call your doctor or health care professional if you are unable to keep an appointment. What may interact with this medicine? -certain antibiotics given by injection -NSAIDs, medicines for pain and inflammation, like ibuprofen or naproxen -some diuretics like bumetanide, furosemide -teriparatide -thalidomide This list may not describe all possible interactions. Give your health care provider a list of all the medicines, herbs, non-prescription drugs, or dietary supplements you use. Also tell them if you smoke, drink alcohol, or use illegal drugs. Some items may interact with your medicine. What should I watch for while using this medicine? Visit your doctor or health care professional for regular checkups. It may be some time before you see the benefit from this medicine. Do not stop taking your medicine unless your doctor tells you to. Your doctor may order blood tests or other tests to see how you are doing. Women should inform their doctor if they wish to become pregnant or think they might be pregnant. There is a potential for serious side effects to an unborn  child. Talk to your health care professional or pharmacist for more information. You should make sure that you get enough calcium and vitamin D while you are taking this medicine. Discuss the foods you eat and the vitamins you take with  your health care professional. Some people who take this medicine have severe bone, joint, and/or muscle pain. This medicine may also increase your risk for jaw problems or a broken thigh bone. Tell your doctor right away if you have severe pain in your jaw, bones, joints, or muscles. Tell your doctor if you have any pain that does not go away or that gets worse. Tell your dentist and dental surgeon that you are taking this medicine. You should not have major dental surgery while on this medicine. See your dentist to have a dental exam and fix any dental problems before starting this medicine. Take good care of your teeth while on this medicine. Make sure you see your dentist for regular follow-up appointments. What side effects may I notice from receiving this medicine? Side effects that you should report to your doctor or health care professional as soon as possible: -allergic reactions like skin rash, itching or hives, swelling of the face, lips, or tongue -anxiety, confusion, or depression -breathing problems -changes in vision -eye pain -feeling faint or lightheaded, falls -jaw pain, especially after dental work -mouth sores -muscle cramps, stiffness, or weakness -redness, blistering, peeling or loosening of the skin, including inside the mouth -trouble passing urine or change in the amount of urine Side effects that usually do not require medical attention (report to your doctor or health care professional if they continue or are bothersome): -bone, joint, or muscle pain -constipation -diarrhea -fever -hair loss -irritation at site where injected -loss of appetite -nausea, vomiting -stomach upset -trouble sleeping -trouble swallowing -weak or tired This list may not describe all possible side effects. Call your doctor for medical advice about side effects. You may report side effects to FDA at 1-800-FDA-1088. Where should I keep my medicine? This drug is given in a hospital or  clinic and will not be stored at home. NOTE: This sheet is a summary. It may not cover all possible information. If you have questions about this medicine, talk to your doctor, pharmacist, or health care provider.  2018 Elsevier/Gold Standard (2013-05-30 14:19:39)

## 2016-07-24 ENCOUNTER — Ambulatory Visit (HOSPITAL_BASED_OUTPATIENT_CLINIC_OR_DEPARTMENT_OTHER): Payer: Medicare Other

## 2016-07-24 DIAGNOSIS — C9001 Multiple myeloma in remission: Secondary | ICD-10-CM

## 2016-07-24 DIAGNOSIS — Z5112 Encounter for antineoplastic immunotherapy: Secondary | ICD-10-CM

## 2016-07-24 MED ORDER — ONDANSETRON HCL 8 MG PO TABS
8.0000 mg | ORAL_TABLET | Freq: Once | ORAL | Status: AC
  Administered 2016-07-24: 8 mg via ORAL

## 2016-07-24 MED ORDER — SODIUM CHLORIDE 0.9 % IJ SOLN
10.0000 mL | INTRAMUSCULAR | Status: DC | PRN
  Administered 2016-07-24: 10 mL
  Filled 2016-07-24: qty 10

## 2016-07-24 MED ORDER — DEXAMETHASONE SODIUM PHOSPHATE 10 MG/ML IJ SOLN
10.0000 mg | Freq: Once | INTRAMUSCULAR | Status: AC
  Administered 2016-07-24: 10 mg via INTRAVENOUS

## 2016-07-24 MED ORDER — SODIUM CHLORIDE 0.9 % IV SOLN
Freq: Once | INTRAVENOUS | Status: AC
  Administered 2016-07-24: 08:00:00 via INTRAVENOUS

## 2016-07-24 MED ORDER — DEXTROSE 5 % IV SOLN
27.0000 mg/m2 | Freq: Once | INTRAVENOUS | Status: AC
  Administered 2016-07-24: 50 mg via INTRAVENOUS
  Filled 2016-07-24: qty 25

## 2016-07-24 MED ORDER — DEXAMETHASONE SODIUM PHOSPHATE 10 MG/ML IJ SOLN
INTRAMUSCULAR | Status: AC
  Filled 2016-07-24: qty 1

## 2016-07-24 MED ORDER — HEPARIN SOD (PORK) LOCK FLUSH 100 UNIT/ML IV SOLN
500.0000 [IU] | Freq: Once | INTRAVENOUS | Status: AC | PRN
  Administered 2016-07-24: 500 [IU]
  Filled 2016-07-24: qty 5

## 2016-07-24 MED ORDER — ONDANSETRON HCL 8 MG PO TABS
ORAL_TABLET | ORAL | Status: AC
  Filled 2016-07-24: qty 1

## 2016-07-24 NOTE — Patient Instructions (Signed)
[place] Discharge Instructions for Patients Receiving Chemotherapy  Today you received the following chemotherapy agents Kyprolis  To help prevent nausea and vomiting after your treatment, we encourage you to take your nausea medication    If you develop nausea and vomiting that is not controlled by your nausea medication, call the clinic.   BELOW ARE SYMPTOMS THAT SHOULD BE REPORTED IMMEDIATELY:  *FEVER GREATER THAN 100.5 F  *CHILLS WITH OR WITHOUT FEVER  NAUSEA AND VOMITING THAT IS NOT CONTROLLED WITH YOUR NAUSEA MEDICATION  *UNUSUAL SHORTNESS OF BREATH  *UNUSUAL BRUISING OR BLEEDING  TENDERNESS IN MOUTH AND THROAT WITH OR WITHOUT PRESENCE OF ULCERS  *URINARY PROBLEMS  *BOWEL PROBLEMS  UNUSUAL RASH Items with * indicate a potential emergency and should be followed up as soon as possible.  Feel free to call the clinic you have any questions or concerns. The clinic phone number is [phone].  Please show the [place] at check-in to the Emergency Department and triage nurse.

## 2016-07-30 ENCOUNTER — Ambulatory Visit (HOSPITAL_BASED_OUTPATIENT_CLINIC_OR_DEPARTMENT_OTHER): Payer: Medicare Other

## 2016-07-30 VITALS — BP 118/62 | HR 69 | Temp 97.8°F | Resp 18

## 2016-07-30 DIAGNOSIS — Z5112 Encounter for antineoplastic immunotherapy: Secondary | ICD-10-CM

## 2016-07-30 DIAGNOSIS — C9000 Multiple myeloma not having achieved remission: Secondary | ICD-10-CM

## 2016-07-30 DIAGNOSIS — Z5111 Encounter for antineoplastic chemotherapy: Secondary | ICD-10-CM | POA: Diagnosis not present

## 2016-07-30 LAB — COMPREHENSIVE METABOLIC PANEL
ALBUMIN: 3.3 g/dL — AB (ref 3.5–5.0)
ALK PHOS: 107 U/L (ref 40–150)
ALT: 19 U/L (ref 0–55)
AST: 17 U/L (ref 5–34)
Anion Gap: 9 mEq/L (ref 3–11)
BUN: 29 mg/dL — AB (ref 7.0–26.0)
CO2: 22 mEq/L (ref 22–29)
CREATININE: 2.5 mg/dL — AB (ref 0.6–1.1)
Calcium: 8.9 mg/dL (ref 8.4–10.4)
Chloride: 110 mEq/L — ABNORMAL HIGH (ref 98–109)
EGFR: 18 mL/min/{1.73_m2} — ABNORMAL LOW (ref >90)
GLUCOSE: 90 mg/dL (ref 70–140)
POTASSIUM: 3.8 meq/L (ref 3.5–5.1)
SODIUM: 141 meq/L (ref 136–145)
TOTAL PROTEIN: 5.4 g/dL — AB (ref 6.4–8.3)
Total Bilirubin: 0.82 mg/dL (ref 0.20–1.20)

## 2016-07-30 LAB — CBC WITH DIFFERENTIAL/PLATELET
BASO%: 0.2 % (ref 0.0–2.0)
Basophils Absolute: 0 10*3/uL (ref 0.0–0.1)
EOS%: 3.7 % (ref 0.0–7.0)
Eosinophils Absolute: 0.2 10*3/uL (ref 0.0–0.5)
HCT: 29 % — ABNORMAL LOW (ref 34.8–46.6)
HEMOGLOBIN: 9.7 g/dL — AB (ref 11.6–15.9)
LYMPH#: 1 10*3/uL (ref 0.9–3.3)
LYMPH%: 19.3 % (ref 14.0–49.7)
MCH: 34.6 pg — AB (ref 25.1–34.0)
MCHC: 33.4 g/dL (ref 31.5–36.0)
MCV: 103.6 fL — ABNORMAL HIGH (ref 79.5–101.0)
MONO#: 1 10*3/uL — ABNORMAL HIGH (ref 0.1–0.9)
MONO%: 19.5 % — AB (ref 0.0–14.0)
NEUT%: 57.3 % (ref 38.4–76.8)
NEUTROS ABS: 3 10*3/uL (ref 1.5–6.5)
Platelets: 79 10*3/uL — ABNORMAL LOW (ref 145–400)
RBC: 2.8 10*6/uL — ABNORMAL LOW (ref 3.70–5.45)
RDW: 22.1 % — AB (ref 11.2–14.5)
WBC: 5.1 10*3/uL (ref 3.9–10.3)

## 2016-07-30 MED ORDER — DEXAMETHASONE SODIUM PHOSPHATE 10 MG/ML IJ SOLN
10.0000 mg | Freq: Once | INTRAMUSCULAR | Status: AC
  Administered 2016-07-30: 10 mg via INTRAVENOUS

## 2016-07-30 MED ORDER — SODIUM CHLORIDE 0.9 % IV SOLN
225.0000 mg/m2 | Freq: Once | INTRAVENOUS | Status: AC
  Administered 2016-07-30: 420 mg via INTRAVENOUS
  Filled 2016-07-30: qty 21

## 2016-07-30 MED ORDER — ONDANSETRON HCL 8 MG PO TABS
ORAL_TABLET | ORAL | Status: AC
  Filled 2016-07-30: qty 1

## 2016-07-30 MED ORDER — ACETAMINOPHEN 500 MG PO TABS
1000.0000 mg | ORAL_TABLET | Freq: Once | ORAL | Status: AC
  Administered 2016-07-30: 1000 mg via ORAL

## 2016-07-30 MED ORDER — HEPARIN SOD (PORK) LOCK FLUSH 100 UNIT/ML IV SOLN
500.0000 [IU] | Freq: Once | INTRAVENOUS | Status: AC | PRN
  Administered 2016-07-30: 500 [IU]
  Filled 2016-07-30: qty 5

## 2016-07-30 MED ORDER — ACETAMINOPHEN 500 MG PO TABS
ORAL_TABLET | ORAL | Status: AC
  Filled 2016-07-30: qty 2

## 2016-07-30 MED ORDER — SODIUM CHLORIDE 0.9 % IJ SOLN
10.0000 mL | INTRAMUSCULAR | Status: DC | PRN
  Administered 2016-07-30: 10 mL
  Filled 2016-07-30: qty 10

## 2016-07-30 MED ORDER — SODIUM CHLORIDE 0.9 % IV SOLN
Freq: Once | INTRAVENOUS | Status: AC
  Administered 2016-07-30: 11:00:00 via INTRAVENOUS

## 2016-07-30 MED ORDER — DEXTROSE 5 % IV SOLN
27.0000 mg/m2 | Freq: Once | INTRAVENOUS | Status: AC
  Administered 2016-07-30: 50 mg via INTRAVENOUS
  Filled 2016-07-30: qty 25

## 2016-07-30 MED ORDER — DEXAMETHASONE SODIUM PHOSPHATE 10 MG/ML IJ SOLN
INTRAMUSCULAR | Status: AC
  Filled 2016-07-30: qty 1

## 2016-07-30 MED ORDER — ONDANSETRON HCL 8 MG PO TABS
8.0000 mg | ORAL_TABLET | Freq: Once | ORAL | Status: AC
  Administered 2016-07-30: 8 mg via ORAL

## 2016-07-30 NOTE — Patient Instructions (Signed)
[facility] Discharge Instructions for Patients Receiving Chemotherapy  Today you received the following chemotherapy agents Cytoxan and Kyprolis. To help prevent nausea and vomiting after your treatment, we encourage you to take your nausea medication as prescribed.   If you develop nausea and vomiting that is not controlled by your nausea medication, call the clinic.   BELOW ARE SYMPTOMS THAT SHOULD BE REPORTED IMMEDIATELY:  *FEVER GREATER THAN 100.5 F  *CHILLS WITH OR WITHOUT FEVER  NAUSEA AND VOMITING THAT IS NOT CONTROLLED WITH YOUR NAUSEA MEDICATION  *UNUSUAL SHORTNESS OF BREATH  *UNUSUAL BRUISING OR BLEEDING  TENDERNESS IN MOUTH AND THROAT WITH OR WITHOUT PRESENCE OF ULCERS  *URINARY PROBLEMS  *BOWEL PROBLEMS  UNUSUAL RASH Items with * indicate a potential emergency and should be followed up as soon as possible.  Feel free to call the clinic you have any questions or concerns. The clinic phone number is [phone].  Please show the [place] at check-in to the Emergency Department and triage nurse.

## 2016-07-30 NOTE — Progress Notes (Signed)
Per Dr. Julien Nordmann, it's OK to treat with today's lab values. Infusion room notified.

## 2016-07-31 ENCOUNTER — Ambulatory Visit (HOSPITAL_BASED_OUTPATIENT_CLINIC_OR_DEPARTMENT_OTHER): Payer: Medicare Other

## 2016-07-31 ENCOUNTER — Telehealth: Payer: Self-pay | Admitting: *Deleted

## 2016-07-31 DIAGNOSIS — C9001 Multiple myeloma in remission: Secondary | ICD-10-CM

## 2016-07-31 DIAGNOSIS — Z5112 Encounter for antineoplastic immunotherapy: Secondary | ICD-10-CM | POA: Diagnosis not present

## 2016-07-31 MED ORDER — DEXAMETHASONE SODIUM PHOSPHATE 10 MG/ML IJ SOLN
10.0000 mg | Freq: Once | INTRAMUSCULAR | Status: AC
Start: 2016-07-31 — End: 2016-07-31
  Administered 2016-07-31: 10 mg via INTRAVENOUS

## 2016-07-31 MED ORDER — ONDANSETRON HCL 8 MG PO TABS
ORAL_TABLET | ORAL | Status: AC
  Filled 2016-07-31: qty 1

## 2016-07-31 MED ORDER — SODIUM CHLORIDE 0.9 % IV SOLN: Freq: Once | INTRAVENOUS | Status: DC

## 2016-07-31 MED ORDER — ONDANSETRON HCL 8 MG PO TABS
8.0000 mg | ORAL_TABLET | Freq: Once | ORAL | Status: AC
  Administered 2016-07-31: 8 mg via ORAL

## 2016-07-31 MED ORDER — SODIUM CHLORIDE 0.9 % IJ SOLN
10.0000 mL | INTRAMUSCULAR | Status: DC | PRN
  Administered 2016-07-31: 10 mL
  Filled 2016-07-31: qty 10

## 2016-07-31 MED ORDER — HEPARIN SOD (PORK) LOCK FLUSH 100 UNIT/ML IV SOLN
500.0000 [IU] | Freq: Once | INTRAVENOUS | Status: AC | PRN
  Administered 2016-07-31: 500 [IU]
  Filled 2016-07-31: qty 5

## 2016-07-31 MED ORDER — DEXTROSE 5 % IV SOLN
27.0000 mg/m2 | Freq: Once | INTRAVENOUS | Status: AC
  Administered 2016-07-31: 50 mg via INTRAVENOUS
  Filled 2016-07-31: qty 25

## 2016-07-31 MED ORDER — SODIUM CHLORIDE 0.9 % IV SOLN
Freq: Once | INTRAVENOUS | Status: AC
  Administered 2016-07-31: 10:00:00 via INTRAVENOUS

## 2016-07-31 MED ORDER — DEXAMETHASONE SODIUM PHOSPHATE 10 MG/ML IJ SOLN
INTRAMUSCULAR | Status: AC
  Filled 2016-07-31: qty 1

## 2016-07-31 NOTE — Patient Instructions (Signed)
[place] Discharge Instructions for Patients Receiving Chemotherapy  Today you received the following chemotherapy agents kryprolis   To help prevent nausea and vomiting after your treatment, we encourage you to take your nausea medication as directed.  If you develop nausea and vomiting that is not controlled by your nausea medication, call the clinic.   BELOW ARE SYMPTOMS THAT SHOULD BE REPORTED IMMEDIATELY:  *FEVER GREATER THAN 100.5 F  *CHILLS WITH OR WITHOUT FEVER  NAUSEA AND VOMITING THAT IS NOT CONTROLLED WITH YOUR NAUSEA MEDICATION  *UNUSUAL SHORTNESS OF BREATH  *UNUSUAL BRUISING OR BLEEDING  TENDERNESS IN MOUTH AND THROAT WITH OR WITHOUT PRESENCE OF ULCERS  *URINARY PROBLEMS  *BOWEL PROBLEMS  UNUSUAL RASH Items with * indicate a potential emergency and should be followed up as soon as possible.  Feel free to call the clinic you have any questions or concerns. The clinic phone number is [phone].  Please show the [facility] at check-in to the Emergency Department and triage nurse.

## 2016-07-31 NOTE — Telephone Encounter (Signed)
Patient called re:  CT appointment, acknowledged particulars.

## 2016-07-31 NOTE — Telephone Encounter (Signed)
Called patient and left a voice mail to notify her appt for CT scan at [facility] on 10/08/16 at 8:40 AM. Asked that she call back to confirm that she got this appt information. Myrtis Hopping

## 2016-08-03 ENCOUNTER — Other Ambulatory Visit: Payer: Self-pay | Admitting: Physician Assistant

## 2016-08-03 ENCOUNTER — Other Ambulatory Visit: Payer: Self-pay | Admitting: Cardiovascular Disease

## 2016-08-06 ENCOUNTER — Ambulatory Visit (HOSPITAL_BASED_OUTPATIENT_CLINIC_OR_DEPARTMENT_OTHER): Payer: Medicare Other

## 2016-08-06 ENCOUNTER — Other Ambulatory Visit (HOSPITAL_BASED_OUTPATIENT_CLINIC_OR_DEPARTMENT_OTHER): Payer: Medicare Other

## 2016-08-06 DIAGNOSIS — D630 Anemia in neoplastic disease: Secondary | ICD-10-CM

## 2016-08-06 DIAGNOSIS — Z452 Encounter for adjustment and management of vascular access device: Secondary | ICD-10-CM | POA: Diagnosis not present

## 2016-08-06 DIAGNOSIS — D696 Thrombocytopenia, unspecified: Secondary | ICD-10-CM | POA: Diagnosis not present

## 2016-08-06 DIAGNOSIS — C9001 Multiple myeloma in remission: Secondary | ICD-10-CM

## 2016-08-06 DIAGNOSIS — Z5111 Encounter for antineoplastic chemotherapy: Secondary | ICD-10-CM

## 2016-08-06 DIAGNOSIS — B450 Pulmonary cryptococcosis: Secondary | ICD-10-CM | POA: Diagnosis not present

## 2016-08-06 DIAGNOSIS — C9000 Multiple myeloma not having achieved remission: Secondary | ICD-10-CM | POA: Diagnosis not present

## 2016-08-06 LAB — CBC WITH DIFFERENTIAL/PLATELET
BASO%: 0.4 % (ref 0.0–2.0)
Basophils Absolute: 0 10*3/uL (ref 0.0–0.1)
EOS ABS: 0.2 10*3/uL (ref 0.0–0.5)
EOS%: 5.1 % (ref 0.0–7.0)
HCT: 28.4 % — ABNORMAL LOW (ref 34.8–46.6)
HEMOGLOBIN: 9.4 g/dL — AB (ref 11.6–15.9)
LYMPH%: 17.2 % (ref 14.0–49.7)
MCH: 34.3 pg — ABNORMAL HIGH (ref 25.1–34.0)
MCHC: 33 g/dL (ref 31.5–36.0)
MCV: 103.8 fL — AB (ref 79.5–101.0)
MONO#: 0.7 10*3/uL (ref 0.1–0.9)
MONO%: 16.7 % — ABNORMAL HIGH (ref 0.0–14.0)
NEUT%: 60.6 % (ref 38.4–76.8)
NEUTROS ABS: 2.5 10*3/uL (ref 1.5–6.5)
PLATELETS: 61 10*3/uL — AB (ref 145–400)
RBC: 2.74 10*6/uL — ABNORMAL LOW (ref 3.70–5.45)
RDW: 24.8 % — AB (ref 11.2–14.5)
WBC: 4.1 10*3/uL (ref 3.9–10.3)
lymph#: 0.7 10*3/uL — ABNORMAL LOW (ref 0.9–3.3)

## 2016-08-06 LAB — LACTATE DEHYDROGENASE: LDH: 213 U/L (ref 125–245)

## 2016-08-06 LAB — COMPREHENSIVE METABOLIC PANEL
ALBUMIN: 3.8 g/dL (ref 3.5–5.0)
ALK PHOS: 132 U/L (ref 40–150)
ALT: 25 U/L (ref 0–55)
ANION GAP: 10 meq/L (ref 3–11)
AST: 13 U/L (ref 5–34)
BILIRUBIN TOTAL: 1.06 mg/dL (ref 0.20–1.20)
BUN: 32.5 mg/dL — ABNORMAL HIGH (ref 7.0–26.0)
CO2: 22 mEq/L (ref 22–29)
Calcium: 9.4 mg/dL (ref 8.4–10.4)
Chloride: 107 mEq/L (ref 98–109)
Creatinine: 2.9 mg/dL — ABNORMAL HIGH (ref 0.6–1.1)
EGFR: 15 mL/min/{1.73_m2} — AB (ref >90)
Glucose: 108 mg/dl (ref 70–140)
Potassium: 4 mEq/L (ref 3.5–5.1)
Sodium: 139 mEq/L (ref 136–145)
TOTAL PROTEIN: 6 g/dL — AB (ref 6.4–8.3)

## 2016-08-06 MED ORDER — SODIUM CHLORIDE 0.9 % IJ SOLN
10.0000 mL | INTRAMUSCULAR | Status: DC | PRN
  Administered 2016-08-06: 10 mL
  Filled 2016-08-06: qty 10

## 2016-08-06 MED ORDER — HEPARIN SOD (PORK) LOCK FLUSH 100 UNIT/ML IV SOLN
500.0000 [IU] | Freq: Once | INTRAVENOUS | Status: AC | PRN
  Administered 2016-08-06: 500 [IU]
  Filled 2016-08-06: qty 5

## 2016-08-06 NOTE — Progress Notes (Signed)
Treatment canceled today by Dr. Julien Nordmann per Shauna Hugh, RN due to platelets at 32.  Cyndia Bent RN

## 2016-08-06 NOTE — Patient Instructions (Signed)
Treatment canceled.

## 2016-08-07 ENCOUNTER — Ambulatory Visit: Payer: Medicare Other

## 2016-08-07 LAB — BETA 2 MICROGLOBULIN, SERUM: Beta-2: 7.5 mg/L — ABNORMAL HIGH (ref 0.6–2.4)

## 2016-08-07 LAB — KAPPA/LAMBDA LIGHT CHAINS
IG LAMBDA FREE LIGHT CHAIN: 2.2 mg/L — AB (ref 5.7–26.3)
Ig Kappa Free Light Chain: 9 mg/L (ref 3.3–19.4)
Kappa/Lambda FluidC Ratio: 4.09 — ABNORMAL HIGH (ref 0.26–1.65)

## 2016-08-09 LAB — IGG, IGA, IGM
IGA/IMMUNOGLOBULIN A, SERUM: 142 mg/dL (ref 64–422)
IgG, Qn, Serum: 211 mg/dL — ABNORMAL LOW (ref 700–1600)

## 2016-08-20 ENCOUNTER — Ambulatory Visit (HOSPITAL_BASED_OUTPATIENT_CLINIC_OR_DEPARTMENT_OTHER): Payer: Medicare Other | Admitting: Internal Medicine

## 2016-08-20 ENCOUNTER — Ambulatory Visit: Payer: Medicare Other

## 2016-08-20 ENCOUNTER — Ambulatory Visit (HOSPITAL_BASED_OUTPATIENT_CLINIC_OR_DEPARTMENT_OTHER): Payer: Medicare Other

## 2016-08-20 ENCOUNTER — Encounter: Payer: Self-pay | Admitting: Internal Medicine

## 2016-08-20 ENCOUNTER — Other Ambulatory Visit (HOSPITAL_BASED_OUTPATIENT_CLINIC_OR_DEPARTMENT_OTHER): Payer: Medicare Other

## 2016-08-20 VITALS — BP 140/77 | HR 68 | Temp 98.5°F | Resp 18 | Ht 65.5 in | Wt 161.1 lb

## 2016-08-20 DIAGNOSIS — G620 Drug-induced polyneuropathy: Secondary | ICD-10-CM

## 2016-08-20 DIAGNOSIS — Z5111 Encounter for antineoplastic chemotherapy: Secondary | ICD-10-CM | POA: Diagnosis not present

## 2016-08-20 DIAGNOSIS — C9000 Multiple myeloma not having achieved remission: Secondary | ICD-10-CM

## 2016-08-20 DIAGNOSIS — C9002 Multiple myeloma in relapse: Secondary | ICD-10-CM

## 2016-08-20 DIAGNOSIS — Z5112 Encounter for antineoplastic immunotherapy: Secondary | ICD-10-CM | POA: Diagnosis not present

## 2016-08-20 DIAGNOSIS — D696 Thrombocytopenia, unspecified: Secondary | ICD-10-CM | POA: Diagnosis not present

## 2016-08-20 DIAGNOSIS — D630 Anemia in neoplastic disease: Secondary | ICD-10-CM | POA: Diagnosis not present

## 2016-08-20 DIAGNOSIS — C9001 Multiple myeloma in remission: Secondary | ICD-10-CM

## 2016-08-20 DIAGNOSIS — N185 Chronic kidney disease, stage 5: Secondary | ICD-10-CM

## 2016-08-20 DIAGNOSIS — Z853 Personal history of malignant neoplasm of breast: Secondary | ICD-10-CM

## 2016-08-20 LAB — CBC WITH DIFFERENTIAL/PLATELET
BASO%: 0.4 % (ref 0.0–2.0)
Basophils Absolute: 0 10*3/uL (ref 0.0–0.1)
EOS ABS: 0.2 10*3/uL (ref 0.0–0.5)
EOS%: 3.1 % (ref 0.0–7.0)
HCT: 27.2 % — ABNORMAL LOW (ref 34.8–46.6)
HGB: 8.8 g/dL — ABNORMAL LOW (ref 11.6–15.9)
LYMPH%: 20.6 % (ref 14.0–49.7)
MCH: 35.3 pg — AB (ref 25.1–34.0)
MCHC: 32.4 g/dL (ref 31.5–36.0)
MCV: 109.2 fL — AB (ref 79.5–101.0)
MONO#: 0.9 10*3/uL (ref 0.1–0.9)
MONO%: 16.6 % — AB (ref 0.0–14.0)
NEUT#: 3.3 10*3/uL (ref 1.5–6.5)
NEUT%: 59.3 % (ref 38.4–76.8)
PLATELETS: 86 10*3/uL — AB (ref 145–400)
RBC: 2.49 10*6/uL — AB (ref 3.70–5.45)
RDW: 24.3 % — ABNORMAL HIGH (ref 11.2–14.5)
WBC: 5.5 10*3/uL (ref 3.9–10.3)
lymph#: 1.1 10*3/uL (ref 0.9–3.3)

## 2016-08-20 LAB — COMPREHENSIVE METABOLIC PANEL
ALT: 11 U/L (ref 0–55)
ANION GAP: 9 meq/L (ref 3–11)
AST: 16 U/L (ref 5–34)
Albumin: 3.8 g/dL (ref 3.5–5.0)
Alkaline Phosphatase: 105 U/L (ref 40–150)
BILIRUBIN TOTAL: 1.14 mg/dL (ref 0.20–1.20)
BUN: 30 mg/dL — ABNORMAL HIGH (ref 7.0–26.0)
CHLORIDE: 111 meq/L — AB (ref 98–109)
CO2: 22 meq/L (ref 22–29)
Calcium: 9 mg/dL (ref 8.4–10.4)
Creatinine: 2.6 mg/dL — ABNORMAL HIGH (ref 0.6–1.1)
EGFR: 17 mL/min/{1.73_m2} — AB (ref >90)
Glucose: 97 mg/dl (ref 70–140)
POTASSIUM: 3.8 meq/L (ref 3.5–5.1)
Sodium: 141 mEq/L (ref 136–145)
Total Protein: 5.8 g/dL — ABNORMAL LOW (ref 6.4–8.3)

## 2016-08-20 MED ORDER — ONDANSETRON HCL 8 MG PO TABS
8.0000 mg | ORAL_TABLET | Freq: Once | ORAL | Status: AC
  Administered 2016-08-20: 8 mg via ORAL

## 2016-08-20 MED ORDER — DEXTROSE 5 % IV SOLN
27.0000 mg/m2 | Freq: Once | INTRAVENOUS | Status: AC
  Administered 2016-08-20: 50 mg via INTRAVENOUS
  Filled 2016-08-20: qty 25

## 2016-08-20 MED ORDER — SODIUM CHLORIDE 0.9 % IJ SOLN
10.0000 mL | INTRAMUSCULAR | Status: DC | PRN
  Administered 2016-08-20: 10 mL
  Filled 2016-08-20: qty 10

## 2016-08-20 MED ORDER — SODIUM CHLORIDE 0.9 % IV SOLN
225.0000 mg/m2 | Freq: Once | INTRAVENOUS | Status: AC
  Administered 2016-08-20: 420 mg via INTRAVENOUS
  Filled 2016-08-20: qty 21

## 2016-08-20 MED ORDER — DEXAMETHASONE SODIUM PHOSPHATE 10 MG/ML IJ SOLN
INTRAMUSCULAR | Status: AC
Start: 2016-08-20 — End: ?
  Filled 2016-08-20: qty 1

## 2016-08-20 MED ORDER — SODIUM CHLORIDE 0.9 % IV SOLN: Freq: Once | INTRAVENOUS | Status: DC

## 2016-08-20 MED ORDER — SODIUM CHLORIDE 0.9 % IV SOLN
Freq: Once | INTRAVENOUS | Status: AC
  Administered 2016-08-20: 12:00:00 via INTRAVENOUS

## 2016-08-20 MED ORDER — HEPARIN SOD (PORK) LOCK FLUSH 100 UNIT/ML IV SOLN
500.0000 [IU] | Freq: Once | INTRAVENOUS | Status: AC | PRN
  Administered 2016-08-20: 500 [IU]
  Filled 2016-08-20: qty 5

## 2016-08-20 MED ORDER — DEXAMETHASONE SODIUM PHOSPHATE 10 MG/ML IJ SOLN
10.0000 mg | Freq: Once | INTRAMUSCULAR | Status: AC
  Administered 2016-08-20: 10 mg via INTRAVENOUS

## 2016-08-20 MED ORDER — ONDANSETRON HCL 8 MG PO TABS
ORAL_TABLET | ORAL | Status: AC
Start: 2016-08-20 — End: ?
  Filled 2016-08-20: qty 1

## 2016-08-20 NOTE — Progress Notes (Signed)
[place] [phone]   [phone]  OFFICE PROGRESS NOTE  [name], [place] [address] [place] [address]  DIAGNOSIS:  1. 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 Recurrent multiple myeloma initially diagnosed as a smoldering myeloma at [facility] in 2002. 3. Ductal carcinoma in situ status post mastectomy with sentinel lymph node biopsy in October 2008.  PRIOR THERAPY: 1. Status post treatment with tamoxifen from November 2008 through February 2009, discontinued secondary to intolerance. 2. Status post 3 cycles of chemotherapy with Revlimid and Decadron followed by 1 cycle of Decadron only with mild response. 3. Status post 2 cycles of systemic chemotherapy with Velcade, Doxil and Decadron discontinued secondary to significant peripheral neuropathy. Last dose was given May 2010 at [facility]. 4. Status post autologous peripheral blood stem cell transplant on October 01, 2008 at [facility] under the care of Dr. Phyllis Ginger.  5. Systemic chemotherapy with Carfilzomib initially was 20 mg/M2 and will be increased after cycle #1 to 36 mg/M2 on days 1, 2, 8, 9, 15 and 16 every 4 weeks in addition to Cytoxan 300 mg/M2 and Decadron 40 mg by mouth weekly basis, status post 4 cycles. First cycle on 12/29/2012. 6. She resumed chemotherapy with Carfilzomib, Cytoxan, and Decadron on 05/03/2014. Status post 6 cycles. 7. systemic chemotherapy again with Carfilzomib, Cytoxan and Decadron on 07/11/2015. Status post 3 cycles. 8. She completed a course of treatment with Diflucan 200 mg by mouth daily for around 6 weeks for cryptococcal infection of the lung.  CURRENT THERAPY: Resuming treatment again with Carfilzomib, Cytoxan and Decadron, first cycle 04/23/2016. Status post 4 cycles  INTERVAL HISTORY: Nancy Norris 76 y.o. female returns to the clinic today for follow-up visit. The  patient is feeling fine today with no specific complaints except for generalized aching pain as well as peripheral neuropathy. She is currently on Lyrica by her nephrologist. She also has mild swelling of her feet especially in the evening time. She had 2 episodes of diarrhea this weekend. The patient has been tolerating her treatment with Carfilzomib, Cytoxan and Decadron fairly well. She denied having any current nausea or vomiting. She has no fever or chills. She denied having any chest pain, shortness of breath, cough or hemoptysis. She had repeat myeloma panel performed recently and she is here for evaluation and discussion of her lab results and treatment options.  MEDICAL HISTORY: Past Medical History:  Diagnosis Date  . Anemia   . Anginal pain ([place])    used NTG x 2 May 31 and 06/15/13   . Anxiety   . Arthritis   . B12 deficiency 12/04/2014  . Breast cancer ([place])   . CKD (chronic kidney disease) stage 3, GFR 30-59 ml/min   . Complication of anesthesia   . COPD (chronic obstructive pulmonary disease) ([place])   . Cryptococcal pneumonitis ([place]) 11/22/2015  . Depression   . Dizziness   . Dyspnea   . Fibromyalgia   . Fibromyalgia   . GERD (gastroesophageal reflux disease)   . Headache(784.0)   . Heart murmur   . Hemoptysis 10/21/2015  . History of blood transfusion    last one May 12   . Hx of cardiovascular stress test    LexiScan with low level exercise Myoview (02/2013): No ischemia, EF 72%; normal study  . Hx of echocardiogram    a.  Echocardiogram (12/26/2012): EF 59-09%, grade 1 diastolic dysfunction;  b.  Echocardiogram (02/2013): EF 55-60%, no WMA, trivial effusion  . Hyperkalemia   . Hyponatremia   . Hypothyroidism   . Mucositis   . Multiple myeloma   . Myocardial infarction [facility])    in past, patient was unaware.   . Neuropathy   . Pain in joint, pelvic region and thigh 07/07/2015  . PONV (postoperative nausea and vomiting) 2008   after mastestomy    ALLERGIES:  is  allergic to codeine; latex; other; onion; zyprexa [olanzapine]; adhesive [tape]; hydrocodone; iodinated diagnostic agents; and sulfa antibiotics.  MEDICATIONS:  Current Outpatient Prescriptions  Medication Sig Dispense Refill  . acetaminophen (TYLENOL) 325 MG tablet Take 650 mg by mouth every 6 (six) hours as needed for mild pain or moderate pain.     Marland Kitchen albuterol (PROVENTIL HFA;VENTOLIN HFA) 108 (90 Base) MCG/ACT inhaler Inhale 1-2 puffs into the lungs every 6 (six) hours as needed for wheezing or shortness of breath (cough). 1 Inhaler 3  . amphetamine-dextroamphetamine (ADDERALL) 20 MG tablet Take 1/2 to 1 tablet 1 or 2 x / day for focusing/concentration & Depression 60 tablet 0  . buPROPion (WELLBUTRIN XL) 300 MG 24 hr tablet TAKE 1 TABLET BY MOUTH EACH MORNING FOR MOOD AND DEPRESSION. 90 tablet 1  . butalbital-acetaminophen-caffeine (FIORICET, ESGIC) 50-325-40 MG tablet TAKE 1 OR 2 TABLETS BY MOUTH EVERY 4 HOURS AS NEEDED FOR PAIN OR HEADACHE. 50 tablet 3  . Cholecalciferol 4000 UNITS TABS Take 4,000 Units by mouth daily.    . citalopram (CELEXA) 40 MG tablet TAKE 1 TABLET BY MOUTH DAILY OR AS DIRECTED 90 tablet 1  . cromolyn (OPTICROM) 4 % ophthalmic solution Place 1 drop into both eyes 4 (four) times daily.    . fluconazole (DIFLUCAN) 200 MG tablet Take 1 tablet (200 mg total) by mouth daily. 30 tablet 5  . gabapentin (NEURONTIN) 300 MG capsule TAKE 1 CAPSULE BY MOUTH AT BEDTIME. 90 capsule 1  . isosorbide mononitrate (IMDUR) 30 MG 24 hr tablet Take 1 tablet (30 mg total) by mouth daily. 90 tablet 3  . levothyroxine (SYNTHROID, LEVOTHROID) 150 MCG tablet Take 1 tablet (150 mcg total) by mouth daily. (Patient taking differently: Take 150 mcg by mouth daily. ) 90 tablet 1  . loperamide (IMODIUM) 2 MG capsule Take 1 capsule (2 mg total) by mouth as needed for diarrhea or loose stools. 30 capsule 2  . loteprednol (ALREX) 0.2 % SUSP Place 1 drop into both eyes 4 (four) times daily. 1 Bottle PRN  .  magic mouthwash SOLN Take 5 mLs by mouth 4 (four) times daily as needed for mouth pain. 240 mL 1  . meloxicam (MOBIC) 15 MG tablet Take 1 tablet (15 mg total) by mouth daily. 90 tablet 3  . midodrine (PROAMATINE) 10 MG tablet TAKE 1 TABLET BY MOUTH 3 TIMES DAILY. 270 tablet 2  . montelukast (SINGULAIR) 10 MG tablet TAKE 1 TABLET BY MOUTH DAILY. 90 tablet 1  . nitroGLYCERIN (NITROSTAT) 0.4 MG SL tablet Place 0.4 mg under the tongue every 5 (five) minutes as needed for chest pain. Reported on 03/30/2015    . nystatin (MYCOSTATIN/NYSTOP) powder Apply topically 2 (two) times daily. 30 g 0  . PRESCRIPTION MEDICATION Pt gets chemo at [facility].    . ranitidine (ZANTAC) 300 MG tablet Take 1 to 2 tablets daily for heartburn & reflux 180 tablet 1  . senna (SENOKOT) 8.6 MG tablet Take 1 tablet by mouth at bedtime.    . traMADol (ULTRAM) 50 MG tablet  Take 1 to 2 tablets by mouth 4 times a day as needed for pain. 180 tablet 0  . vitamin C (ASCORBIC ACID) 250 MG tablet Take 250 mg by mouth daily.    Marland Kitchen azelastine (ASTELIN) 0.1 % nasal spray Place 2 sprays into both nostrils 2 (two) times daily. Use in each nostril as directed 30 mL 2  . ferrous sulfate 325 (65 FE) MG tablet Take 1 tablet daily or as directed 90 tablet 1   No current facility-administered medications for this visit.     SURGICAL HISTORY:  Past Surgical History:  Procedure Laterality Date  . ABDOMINAL HYSTERECTOMY  1981  . AV FISTULA PLACEMENT Left 06/19/2013   Procedure: CREATION OF LEFT ARM ARTERIOVENOUS (AV) FISTULA ;  Surgeon: Angelia Mould, MD;  Location: [facility];  Service: Vascular;  Laterality: Left;  . BREAST RECONSTRUCTION    . BREAST SURGERY    . CATARACT EXTRACTION, BILATERAL    . CHOLECYSTECTOMY  1971  . EYE SURGERY Bilateral    lens implant  . history of Port removal    . LUNG BIOPSY  10/21/2015  . MASTECTOMY Left 2008  . PORTACATH PLACEMENT  12/2012   has had 2  . Status post stem cell transplant on September 28, 2008.       REVIEW OF SYSTEMS:  Constitutional: positive for fatigue Eyes: negative Ears, nose, mouth, throat, and face: negative Respiratory: negative Cardiovascular: negative Gastrointestinal: negative Genitourinary:negative Integument/breast: negative Hematologic/lymphatic: negative Musculoskeletal:positive for arthralgias Neurological: negative Behavioral/Psych: negative Endocrine: negative Allergic/Immunologic: negative   PHYSICAL EXAMINATION: General appearance: alert, cooperative, fatigued and no distress Head: Normocephalic, without obvious abnormality, atraumatic Neck: no adenopathy, no JVD, supple, symmetrical, trachea midline and thyroid not enlarged, symmetric, no tenderness/mass/nodules Lymph nodes: Cervical, supraclavicular, and axillary nodes normal. Resp: clear to auscultation bilaterally Back: symmetric, no curvature. ROM normal. No CVA tenderness. Cardio: regular rate and rhythm, S1, S2 normal, no murmur, click, rub or gallop GI: soft, non-tender; bowel sounds normal; no masses,  no organomegaly Extremities: extremities normal, atraumatic, no cyanosis or edema Neurologic: Alert and oriented X 3, normal strength and tone. Normal symmetric reflexes. Normal coordination and gait  ECOG PERFORMANCE STATUS: 1 - Symptomatic but completely ambulatory  Blood pressure 140/77, pulse 68, temperature 98.5 F (36.9 C), temperature source Oral, resp. rate 18, height 5' 5.5" (1.664 m), weight 161 lb 1.6 oz (73.1 kg), SpO2 100 %.  LABORATORY DATA: Lab Results  Component Value Date   WBC 5.5 08/20/2016   HGB 8.8 (L) 08/20/2016   HCT 27.2 (L) 08/20/2016   MCV 109.2 (H) 08/20/2016   PLT 86 (L) 08/20/2016      Chemistry      Component Value Date/Time   NA 141 08/20/2016 0948   K 3.8 08/20/2016 0948   CL 106 02/20/2016 1213   CL 105 03/19/2012 0811   CO2 22 08/20/2016 0948   BUN 30.0 (H) 08/20/2016 0948   CREATININE 2.6 (H) 08/20/2016 0948      Component Value Date/Time    CALCIUM 9.0 08/20/2016 0948   ALKPHOS 105 08/20/2016 0948   AST 16 08/20/2016 0948   ALT 11 08/20/2016 0948   BILITOT 1.14 08/20/2016 0948       RADIOGRAPHIC STUDIES: No results found.  ASSESSMENT AND PLAN:  This is a very pleasant 76 years old white female with relapsed multiple myeloma status post several chemotherapy regimens and she is currently on treatment with systemic chemotherapy with Carfilzomib, Cytoxan and Decadron status post 4 cycles.  The patient continues to tolerate this treatment fairly well with no significant adverse effects. Her most recent myeloma panel performed on 08/06/2016 showed no clear evidence for progression. I discussed the lab result with the patient today. I recommended for her to proceed with cycle #5 today as scheduled. For the thrombocytopenia, I will continue to monitor her platelets count closely. For the chronic renal insufficiency, she is followed by nephrology. For the peripheral neuropathy, she will continue her current treatment with Lyrica. I will see her back for follow-up visit in one month's for reevaluation before starting cycle #6. The patient was advised to call immediately if she has any concerning symptoms in the interval. The patient voices understanding of current disease status and treatment options and is in agreement with the current care plan. All questions were answered. The patient knows to call the clinic with any problems, questions or concerns. We can certainly see the patient much sooner if necessary.  Disclaimer: This note was dictated with voice recognition software. Similar sounding words can inadvertently be transcribed and may not be corrected upon review.

## 2016-08-20 NOTE — Progress Notes (Signed)
CBC and CMET reviewed by MD- okay to treat despite labs.

## 2016-08-20 NOTE — Patient Instructions (Signed)

## 2016-08-20 NOTE — Patient Instructions (Signed)
[facility] Discharge Instructions for Patients Receiving Chemotherapy  Today you received the following chemotherapy agents :  Kyprolis,  Cytoxan.  To help prevent nausea and vomiting after your treatment, we encourage you to take your nausea medication as prescribed.   If you develop nausea and vomiting that is not controlled by your nausea medication, call the clinic.   BELOW ARE SYMPTOMS THAT SHOULD BE REPORTED IMMEDIATELY:  *FEVER GREATER THAN 100.5 F  *CHILLS WITH OR WITHOUT FEVER  NAUSEA AND VOMITING THAT IS NOT CONTROLLED WITH YOUR NAUSEA MEDICATION  *UNUSUAL SHORTNESS OF BREATH  *UNUSUAL BRUISING OR BLEEDING  TENDERNESS IN MOUTH AND THROAT WITH OR WITHOUT PRESENCE OF ULCERS  *URINARY PROBLEMS  *BOWEL PROBLEMS  UNUSUAL RASH Items with * indicate a potential emergency and should be followed up as soon as possible.  Feel free to call the clinic you have any questions or concerns. The clinic phone number is [phone].  Please show the [place] at check-in to the Emergency Department and triage nurse.

## 2016-08-21 ENCOUNTER — Ambulatory Visit (HOSPITAL_BASED_OUTPATIENT_CLINIC_OR_DEPARTMENT_OTHER): Payer: Medicare Other

## 2016-08-21 DIAGNOSIS — Z5112 Encounter for antineoplastic immunotherapy: Secondary | ICD-10-CM

## 2016-08-21 DIAGNOSIS — C9002 Multiple myeloma in relapse: Secondary | ICD-10-CM | POA: Diagnosis not present

## 2016-08-21 MED ORDER — SODIUM CHLORIDE 0.9 % IV SOLN
Freq: Once | INTRAVENOUS | Status: AC
  Administered 2016-08-21: 09:00:00 via INTRAVENOUS

## 2016-08-21 MED ORDER — DEXAMETHASONE SODIUM PHOSPHATE 10 MG/ML IJ SOLN
10.0000 mg | Freq: Once | INTRAMUSCULAR | Status: AC
  Administered 2016-08-21: 10 mg via INTRAVENOUS

## 2016-08-21 MED ORDER — ONDANSETRON HCL 8 MG PO TABS
ORAL_TABLET | ORAL | Status: AC
  Filled 2016-08-21: qty 1

## 2016-08-21 MED ORDER — ONDANSETRON HCL 8 MG PO TABS
8.0000 mg | ORAL_TABLET | Freq: Once | ORAL | Status: AC
  Administered 2016-08-21: 8 mg via ORAL

## 2016-08-21 MED ORDER — SODIUM CHLORIDE 0.9 % IJ SOLN
10.0000 mL | INTRAMUSCULAR | Status: DC | PRN
  Administered 2016-08-21: 10 mL
  Filled 2016-08-21: qty 10

## 2016-08-21 MED ORDER — HEPARIN SOD (PORK) LOCK FLUSH 100 UNIT/ML IV SOLN
500.0000 [IU] | Freq: Once | INTRAVENOUS | Status: AC | PRN
  Administered 2016-08-21: 500 [IU]
  Filled 2016-08-21: qty 5

## 2016-08-21 MED ORDER — DEXTROSE 5 % IV SOLN
27.0000 mg/m2 | Freq: Once | INTRAVENOUS | Status: AC
  Administered 2016-08-21: 50 mg via INTRAVENOUS
  Filled 2016-08-21: qty 25

## 2016-08-21 MED ORDER — DEXAMETHASONE SODIUM PHOSPHATE 10 MG/ML IJ SOLN
INTRAMUSCULAR | Status: AC
  Filled 2016-08-21: qty 1

## 2016-08-21 NOTE — Patient Instructions (Signed)
[place] Discharge Instructions for Patients Receiving Chemotherapy  Today you received the following chemotherapy agents Kyprolis  To help prevent nausea and vomiting after your treatment, we encourage you to take your nausea medication    If you develop nausea and vomiting that is not controlled by your nausea medication, call the clinic.   BELOW ARE SYMPTOMS THAT SHOULD BE REPORTED IMMEDIATELY:  *FEVER GREATER THAN 100.5 F  *CHILLS WITH OR WITHOUT FEVER  NAUSEA AND VOMITING THAT IS NOT CONTROLLED WITH YOUR NAUSEA MEDICATION  *UNUSUAL SHORTNESS OF BREATH  *UNUSUAL BRUISING OR BLEEDING  TENDERNESS IN MOUTH AND THROAT WITH OR WITHOUT PRESENCE OF ULCERS  *URINARY PROBLEMS  *BOWEL PROBLEMS  UNUSUAL RASH Items with * indicate a potential emergency and should be followed up as soon as possible.  Feel free to call the clinic you have any questions or concerns. The clinic phone number is [phone].  Please show the [place] at check-in to the Emergency Department and triage nurse.

## 2016-08-27 ENCOUNTER — Telehealth: Payer: Self-pay | Admitting: Internal Medicine

## 2016-08-27 ENCOUNTER — Ambulatory Visit: Payer: Medicare Other

## 2016-08-27 ENCOUNTER — Other Ambulatory Visit: Payer: Medicare Other

## 2016-08-27 NOTE — Telephone Encounter (Signed)
Patient had a friend called is to say that she was too sick to come in for treatment today.  I informed her that she had an appointment that I would leave but she needed to call us back if she could not come in tomorrow

## 2016-08-28 ENCOUNTER — Ambulatory Visit: Payer: Medicare Other

## 2016-09-03 ENCOUNTER — Other Ambulatory Visit (HOSPITAL_BASED_OUTPATIENT_CLINIC_OR_DEPARTMENT_OTHER): Payer: Medicare Other

## 2016-09-03 ENCOUNTER — Other Ambulatory Visit: Payer: Self-pay | Admitting: Internal Medicine

## 2016-09-03 ENCOUNTER — Ambulatory Visit (HOSPITAL_BASED_OUTPATIENT_CLINIC_OR_DEPARTMENT_OTHER): Payer: Medicare Other

## 2016-09-03 DIAGNOSIS — Z5112 Encounter for antineoplastic immunotherapy: Secondary | ICD-10-CM | POA: Diagnosis not present

## 2016-09-03 DIAGNOSIS — C9000 Multiple myeloma not having achieved remission: Secondary | ICD-10-CM | POA: Diagnosis present

## 2016-09-03 DIAGNOSIS — C9002 Multiple myeloma in relapse: Secondary | ICD-10-CM

## 2016-09-03 DIAGNOSIS — Z5111 Encounter for antineoplastic chemotherapy: Secondary | ICD-10-CM | POA: Diagnosis not present

## 2016-09-03 LAB — CBC WITH DIFFERENTIAL/PLATELET
BASO%: 0.8 % (ref 0.0–2.0)
BASOS ABS: 0 10*3/uL (ref 0.0–0.1)
EOS%: 5.8 % (ref 0.0–7.0)
Eosinophils Absolute: 0.2 10*3/uL (ref 0.0–0.5)
HEMATOCRIT: 26.7 % — AB (ref 34.8–46.6)
HGB: 8.6 g/dL — ABNORMAL LOW (ref 11.6–15.9)
LYMPH#: 1 10*3/uL (ref 0.9–3.3)
LYMPH%: 26.3 % (ref 14.0–49.7)
MCH: 35.8 pg — AB (ref 25.1–34.0)
MCHC: 32.2 g/dL (ref 31.5–36.0)
MCV: 111.3 fL — ABNORMAL HIGH (ref 79.5–101.0)
MONO#: 0.5 10*3/uL (ref 0.1–0.9)
MONO%: 12.1 % (ref 0.0–14.0)
NEUT#: 2.1 10*3/uL (ref 1.5–6.5)
NEUT%: 55 % (ref 38.4–76.8)
PLATELETS: 95 10*3/uL — AB (ref 145–400)
RBC: 2.4 10*6/uL — AB (ref 3.70–5.45)
RDW: 21.1 % — ABNORMAL HIGH (ref 11.2–14.5)
WBC: 3.8 10*3/uL — ABNORMAL LOW (ref 3.9–10.3)

## 2016-09-03 LAB — COMPREHENSIVE METABOLIC PANEL
ALT: 29 U/L (ref 0–55)
ANION GAP: 8 meq/L (ref 3–11)
AST: 26 U/L (ref 5–34)
Albumin: 3.4 g/dL — ABNORMAL LOW (ref 3.5–5.0)
Alkaline Phosphatase: 124 U/L (ref 40–150)
BUN: 26.9 mg/dL — ABNORMAL HIGH (ref 7.0–26.0)
CALCIUM: 9.2 mg/dL (ref 8.4–10.4)
CHLORIDE: 112 meq/L — AB (ref 98–109)
CO2: 20 mEq/L — ABNORMAL LOW (ref 22–29)
Creatinine: 2.6 mg/dL — ABNORMAL HIGH (ref 0.6–1.1)
EGFR: 18 mL/min/{1.73_m2} — AB (ref >90)
Glucose: 119 mg/dl (ref 70–140)
POTASSIUM: 3.8 meq/L (ref 3.5–5.1)
Sodium: 140 mEq/L (ref 136–145)
Total Bilirubin: 0.74 mg/dL (ref 0.20–1.20)
Total Protein: 5.5 g/dL — ABNORMAL LOW (ref 6.4–8.3)

## 2016-09-03 MED ORDER — ONDANSETRON HCL 8 MG PO TABS
8.0000 mg | ORAL_TABLET | Freq: Once | ORAL | Status: AC
  Administered 2016-09-03: 8 mg via ORAL

## 2016-09-03 MED ORDER — DEXAMETHASONE SODIUM PHOSPHATE 10 MG/ML IJ SOLN
INTRAMUSCULAR | Status: AC
  Filled 2016-09-03: qty 1

## 2016-09-03 MED ORDER — DEXAMETHASONE SODIUM PHOSPHATE 10 MG/ML IJ SOLN
10.0000 mg | Freq: Once | INTRAMUSCULAR | Status: AC
  Administered 2016-09-03: 10 mg via INTRAVENOUS

## 2016-09-03 MED ORDER — SODIUM CHLORIDE 0.9 % IJ SOLN
10.0000 mL | INTRAMUSCULAR | Status: DC | PRN
  Administered 2016-09-03: 10 mL
  Filled 2016-09-03: qty 10

## 2016-09-03 MED ORDER — SODIUM CHLORIDE 0.9 % IV SOLN
Freq: Once | INTRAVENOUS | Status: AC
  Administered 2016-09-03: 09:00:00 via INTRAVENOUS

## 2016-09-03 MED ORDER — HEPARIN SOD (PORK) LOCK FLUSH 100 UNIT/ML IV SOLN
500.0000 [IU] | Freq: Once | INTRAVENOUS | Status: AC | PRN
  Administered 2016-09-03: 500 [IU]
  Filled 2016-09-03: qty 5

## 2016-09-03 MED ORDER — ONDANSETRON HCL 8 MG PO TABS
ORAL_TABLET | ORAL | Status: AC
  Filled 2016-09-03: qty 1

## 2016-09-03 MED ORDER — SODIUM CHLORIDE 0.9 % IV SOLN
225.0000 mg/m2 | Freq: Once | INTRAVENOUS | Status: AC
  Administered 2016-09-03: 420 mg via INTRAVENOUS
  Filled 2016-09-03: qty 21

## 2016-09-03 MED ORDER — DEXTROSE 5 % IV SOLN
27.0000 mg/m2 | Freq: Once | INTRAVENOUS | Status: AC
  Administered 2016-09-03: 50 mg via INTRAVENOUS
  Filled 2016-09-03: qty 25

## 2016-09-03 NOTE — Progress Notes (Signed)
Ok to proceed with platelets 95 and creatinine 2.6 per Dr. Julien Nordmann.

## 2016-09-03 NOTE — Progress Notes (Signed)
Per Dr. Julien Nordmann it is okay to treat pt today with chemo and todays creatinine and plt count.

## 2016-09-03 NOTE — Patient Instructions (Signed)
[place] Discharge Instructions for Patients Receiving Chemotherapy  Today you received the following chemotherapy agents Kyprolis/Cytoxan  To help prevent nausea and vomiting after your treatment, we encourage you to take your nausea medication as prescribed.   If you develop nausea and vomiting that is not controlled by your nausea medication, call the clinic.   BELOW ARE SYMPTOMS THAT SHOULD BE REPORTED IMMEDIATELY:  *FEVER GREATER THAN 100.5 F  *CHILLS WITH OR WITHOUT FEVER  NAUSEA AND VOMITING THAT IS NOT CONTROLLED WITH YOUR NAUSEA MEDICATION  *UNUSUAL SHORTNESS OF BREATH  *UNUSUAL BRUISING OR BLEEDING  TENDERNESS IN MOUTH AND THROAT WITH OR WITHOUT PRESENCE OF ULCERS  *URINARY PROBLEMS  *BOWEL PROBLEMS  UNUSUAL RASH Items with * indicate a potential emergency and should be followed up as soon as possible.  Feel free to call the clinic you have any questions or concerns. The clinic phone number is [phone].  Please show the [name] at check-in to the Emergency Department and triage nurse.

## 2016-09-04 ENCOUNTER — Ambulatory Visit (HOSPITAL_BASED_OUTPATIENT_CLINIC_OR_DEPARTMENT_OTHER): Payer: Medicare Other

## 2016-09-04 DIAGNOSIS — C9002 Multiple myeloma in relapse: Secondary | ICD-10-CM | POA: Diagnosis not present

## 2016-09-04 DIAGNOSIS — Z5112 Encounter for antineoplastic immunotherapy: Secondary | ICD-10-CM

## 2016-09-04 MED ORDER — SODIUM CHLORIDE 0.9 % IJ SOLN
10.0000 mL | INTRAMUSCULAR | Status: DC | PRN
  Administered 2016-09-04: 10 mL
  Filled 2016-09-04: qty 10

## 2016-09-04 MED ORDER — ONDANSETRON HCL 8 MG PO TABS
ORAL_TABLET | ORAL | Status: AC
  Filled 2016-09-04: qty 1

## 2016-09-04 MED ORDER — DEXTROSE 5 % IV SOLN
27.0000 mg/m2 | Freq: Once | INTRAVENOUS | Status: AC
  Administered 2016-09-04: 50 mg via INTRAVENOUS
  Filled 2016-09-04: qty 25

## 2016-09-04 MED ORDER — SODIUM CHLORIDE 0.9 % IV SOLN
Freq: Once | INTRAVENOUS | Status: AC
  Administered 2016-09-04: 08:00:00 via INTRAVENOUS

## 2016-09-04 MED ORDER — ONDANSETRON HCL 8 MG PO TABS
8.0000 mg | ORAL_TABLET | Freq: Once | ORAL | Status: AC
  Administered 2016-09-04: 8 mg via ORAL

## 2016-09-04 MED ORDER — DEXAMETHASONE SODIUM PHOSPHATE 10 MG/ML IJ SOLN
10.0000 mg | Freq: Once | INTRAMUSCULAR | Status: AC
  Administered 2016-09-04: 10 mg via INTRAVENOUS

## 2016-09-04 MED ORDER — HEPARIN SOD (PORK) LOCK FLUSH 100 UNIT/ML IV SOLN
500.0000 [IU] | Freq: Once | INTRAVENOUS | Status: AC | PRN
  Administered 2016-09-04: 500 [IU]
  Filled 2016-09-04: qty 5

## 2016-09-04 MED ORDER — DEXAMETHASONE SODIUM PHOSPHATE 10 MG/ML IJ SOLN
INTRAMUSCULAR | Status: AC
  Filled 2016-09-04: qty 1

## 2016-09-13 ENCOUNTER — Other Ambulatory Visit: Payer: Medicare Other

## 2016-09-14 ENCOUNTER — Encounter (HOSPITAL_COMMUNITY): Payer: Self-pay | Admitting: *Deleted

## 2016-09-14 ENCOUNTER — Ambulatory Visit (HOSPITAL_COMMUNITY)
Admission: EM | Admit: 2016-09-14 | Discharge: 2016-09-14 | Disposition: A | Discharge status: Home / Self Care | Payer: Medicare Other | Attending: Family Medicine | Admitting: Family Medicine

## 2016-09-14 ENCOUNTER — Ambulatory Visit (HOSPITAL_COMMUNITY): Payer: Medicare Other

## 2016-09-14 ENCOUNTER — Ambulatory Visit (INDEPENDENT_AMBULATORY_CARE_PROVIDER_SITE_OTHER): Payer: Medicare Other

## 2016-09-14 ENCOUNTER — Telehealth: Payer: Self-pay

## 2016-09-14 DIAGNOSIS — R05 Cough: Secondary | ICD-10-CM | POA: Diagnosis not present

## 2016-09-14 DIAGNOSIS — J208 Acute bronchitis due to other specified organisms: Secondary | ICD-10-CM

## 2016-09-14 DIAGNOSIS — R059 Cough, unspecified: Secondary | ICD-10-CM

## 2016-09-14 MED ORDER — IPRATROPIUM BROMIDE 0.06 % NA SOLN
2.0000 | Freq: Four times a day (QID) | NASAL | 0 refills | Status: DC
Start: 2016-09-14 — End: 2016-10-16

## 2016-09-14 NOTE — Telephone Encounter (Signed)
Patient is having pain in left lung when she take a deep breath.  She states it is not a constant pain but is sharp when present. She is afraid her pneumonia has returned and is requesting to have her CT scan moved to an earlier date.   Patient has described a non productive cough with  Sharp pain present mid left chest. She feels like she is going to choke when she coughs.  This has been happening for more than one week. I have advised patient to go [facility] urgent [facility] for evaluation.  Rescheduling the CT scan to a date earlier than Tuesday is not possible.   I advised [facility] since her medical records are in EP[facility] and they will know her history  Patient was assured if the urgent care has concerns they will contact the ID physician on call.   Patient also stated her Mother just passed and she is not sure if she is just anxious or not.  I advised her to go to the Urgent Care to rule out pneumonia.   Laverle Patter, RN

## 2016-09-14 NOTE — ED Provider Notes (Signed)
[place]   [id number] 09/14/16 Arrival Time: [place] PLAN:  1. Acute bronchitis due to other specified organisms   2. Cough     Meds ordered this encounter  Medications  . ipratropium (ATROVENT) 0.06 % nasal spray    Sig: Place 2 sprays into both nostrils 4 (four) times daily.    Dispense:  15 mL    Refill:  0    Order Specific Question:   Supervising Provider    Answer:   Vanessa Kick [[id number]]    Reviewed expectations re: course of current medical issues. Questions answered. Outlined signs and symptoms indicating need for more acute intervention. Patient verbalized understanding. After Visit Summary given.   SUBJECTIVE:  Nancy Norris is a 76 y.o. female who presents with complaint of cough and uri sx's for last few days.  ROS: As per HPI.   OBJECTIVE:  Vitals:   09/14/16 1832  Pulse: 72  Resp: 19  Temp: 98.4 F (36.9 C)  TempSrc: Oral  SpO2: 100%    General appearance: alert; no distress Eyes: PERRLA; EOMI; conjunctiva normal HENT: normocephalic; atraumatic; TMs normal; nasal mucosa normal; oral mucosa normal Neck: supple Lungs: clear to auscultation bilaterally Heart: regular rate and rhythm Abdomen: soft, non-tender; bowel sounds normal; no masses or organomegaly; no guarding or rebound tenderness Back: no CVA tenderness Extremities: no cyanosis or edema; symmetrical with no gross deformities Skin: warm and dry Neurologic: normal gait; normal symmetric reflexes Psychological: alert and cooperative; normal mood and affect  Labs: Results for orders placed or performed in visit on 09/03/16  CBC with Differential  Result Value Ref Range   WBC 3.8 (L) 3.9 - 10.3 10e3/uL   NEUT# 2.1 1.5 - 6.5 10e3/uL   HGB 8.6 (L) 11.6 - 15.9 g/dL   HCT 26.7 (L) 34.8 - 46.6 %   Platelets 95 (L) 145 - 400 10e3/uL   MCV 111.3 (H) 79.5 - 101.0 fL   MCH 35.8 (H) 25.1 - 34.0 pg   MCHC 32.2 31.5 - 36.0 g/dL   RBC 2.40 (L) 3.70 - 5.45 10e6/uL     RDW 21.1 (H) 11.2 - 14.5 %   lymph# 1.0 0.9 - 3.3 10e3/uL   MONO# 0.5 0.1 - 0.9 10e3/uL   Eosinophils Absolute 0.2 0.0 - 0.5 10e3/uL   Basophils Absolute 0.0 0.0 - 0.1 10e3/uL   NEUT% 55.0 38.4 - 76.8 %   LYMPH% 26.3 14.0 - 49.7 %   MONO% 12.1 0.0 - 14.0 %   EOS% 5.8 0.0 - 7.0 %   BASO% 0.8 0.0 - 2.0 %  Comprehensive metabolic panel  Result Value Ref Range   Sodium 140 136 - 145 mEq/L   Potassium 3.8 3.5 - 5.1 mEq/L   Chloride 112 (H) 98 - 109 mEq/L   CO2 20 (L) 22 - 29 mEq/L   Glucose 119 70 - 140 mg/dl   BUN 26.9 (H) 7.0 - 26.0 mg/dL   Creatinine 2.6 (H) 0.6 - 1.1 mg/dL   Total Bilirubin 0.74 0.20 - 1.20 mg/dL   Alkaline Phosphatase 124 40 - 150 U/L   AST 26 5 - 34 U/L   ALT 29 0 - 55 U/L   Total Protein 5.5 (L) 6.4 - 8.3 g/dL   Albumin 3.4 (L) 3.5 - 5.0 g/dL   Calcium 9.2 8.4 - 10.4 mg/dL   Anion Gap 8 3 - 11 mEq/L   EGFR 18 (L) >90 ml/min/1.73 m2   *Note: Due to a large  number of results and/or encounters for the requested time period, some results have not been displayed. A complete set of results can be found in Results Review.   Labs Reviewed - No data to display  Imaging: Dg Chest 2 View  Result Date: 09/14/2016 CLINICAL DATA:  Chest tightness with dry cough and low grade fever EXAM: CHEST  2 VIEW COMPARISON:  CT 04/17/2016, radiograph 11/04/2015 FINDINGS: Right-sided central venous port tip over the cavoatrial junction. Decreased left mid lung mass lesions since prior radiograph. Coarse lower lung interstitial opacity, similar compared to prior radiograph and suspected to be chronic. No definite acute consolidation or effusion is seen. Stable cardiomediastinal silhouette with atherosclerosis. No pneumothorax. IMPRESSION: No active cardiopulmonary disease. Decreased left mid lung mass lesions since the prior radiograph. Electronically Signed   By: Donavan Foil M.D.   On: 09/14/2016 18:50    Allergies  Allergen Reactions  . Codeine Anaphylaxis  . Latex Shortness  Of Breath    Adhesive products   . Other Other (See Comments)    Onion, chocolate causes migraines  . Onion Other (See Comments)    Causes migraine headaches  . Zyprexa [Olanzapine] Other (See Comments)    Confusion , dizzy,unsteady  . Adhesive [Tape] Other (See Comments)    blisters  . Hydrocodone Rash    With extreme itching  . Iodinated Diagnostic Agents Hives, Itching and Rash    Happened 60 years ago Stage IV kidney function  . Sulfa Antibiotics Itching    Past Medical History:  Diagnosis Date  . Anemia   . Anginal pain ([place])    used NTG x 2 May 31 and 06/15/13   . Anxiety   . Arthritis   . B12 deficiency 12/04/2014  . Breast cancer ([place])   . CKD (chronic kidney disease) stage 3, GFR 30-59 ml/min   . Complication of anesthesia   . COPD (chronic obstructive pulmonary disease) ([place])   . Cryptococcal pneumonitis ([place]) 11/22/2015  . Depression   . Dizziness   . Dyspnea   . Fibromyalgia   . Fibromyalgia   . GERD (gastroesophageal reflux disease)   . Headache(784.0)   . Heart murmur   . Hemoptysis 10/21/2015  . History of blood transfusion    last one May 12   . Hx of cardiovascular stress test    LexiScan with low level exercise Myoview (02/2013): No ischemia, EF 72%; normal study  . Hx of echocardiogram    a.  Echocardiogram (12/26/2012): EF 19-50%, grade 1 diastolic dysfunction;   b.  Echocardiogram (02/2013): EF 55-60%, no WMA, trivial effusion  . Hyperkalemia   . Hyponatremia   . Hypothyroidism   . Mucositis   . Multiple myeloma   . Myocardial infarction [facility])    in past, patient was unaware.   . Neuropathy   . Pain in joint, pelvic region and thigh 07/07/2015  . PONV (postoperative nausea and vomiting) 2008   after mastestomy   Social History   Social History  . Marital status: Single    Spouse name: N/A  . Number of children: 1  . Years of education: N/A   Occupational History  . retired-sect.Licensed conveyancer)    Social History Main Topics  . Smoking  status: Former Smoker    Packs/day: 1.00    Years: 30.00    Types: Cigarettes    Quit date: 02/15/2005  . Smokeless tobacco: Never Used  . Alcohol use No  . Drug use: No  . Sexual activity: No  Other Topics Concern  . Not on file   Social History Narrative  . No narrative on file   Family History  Problem Relation Age of Onset  . Arthritis Mother   . Asthma Mother   . Cancer Sister   . Hyperlipidemia Brother    Past Surgical History:  Procedure Laterality Date  . ABDOMINAL HYSTERECTOMY  1981  . AV FISTULA PLACEMENT Left 06/19/2013   Procedure: CREATION OF LEFT ARM ARTERIOVENOUS (AV) FISTULA ;  Surgeon: Angelia Mould, MD;  Location: [facility];  Service: Vascular;  Laterality: Left;  . BREAST RECONSTRUCTION    . BREAST SURGERY    . CATARACT EXTRACTION, BILATERAL    . CHOLECYSTECTOMY  1971  . EYE SURGERY Bilateral    lens implant  . history of Port removal    . LUNG BIOPSY  10/21/2015  . MASTECTOMY Left 2008  . PORTACATH PLACEMENT  12/2012   has had 2  . Status post stem cell transplant on September 28, 2008.       Lysbeth Penner, [place] 09/14/16 [place]

## 2016-09-14 NOTE — ED Triage Notes (Signed)
Patient reports fatigue, tightness in chest, chills, and cough x 1 week. History of PNA. Patient reports hx of fungus in left lung x 1 year.

## 2016-09-18 ENCOUNTER — Ambulatory Visit: Payer: Medicare Other

## 2016-09-18 ENCOUNTER — Ambulatory Visit (HOSPITAL_BASED_OUTPATIENT_CLINIC_OR_DEPARTMENT_OTHER): Payer: Medicare Other

## 2016-09-18 ENCOUNTER — Ambulatory Visit (HOSPITAL_BASED_OUTPATIENT_CLINIC_OR_DEPARTMENT_OTHER): Payer: Medicare Other | Admitting: Internal Medicine

## 2016-09-18 ENCOUNTER — Encounter: Payer: Self-pay | Admitting: Internal Medicine

## 2016-09-18 ENCOUNTER — Other Ambulatory Visit (HOSPITAL_BASED_OUTPATIENT_CLINIC_OR_DEPARTMENT_OTHER): Payer: Medicare Other

## 2016-09-18 VITALS — BP 127/60 | HR 77 | Temp 98.4°F | Resp 18 | Wt 159.1 lb

## 2016-09-18 DIAGNOSIS — Z5112 Encounter for antineoplastic immunotherapy: Secondary | ICD-10-CM

## 2016-09-18 DIAGNOSIS — D696 Thrombocytopenia, unspecified: Secondary | ICD-10-CM | POA: Diagnosis not present

## 2016-09-18 DIAGNOSIS — Z5111 Encounter for antineoplastic chemotherapy: Secondary | ICD-10-CM

## 2016-09-18 DIAGNOSIS — Z95828 Presence of other vascular implants and grafts: Secondary | ICD-10-CM

## 2016-09-18 DIAGNOSIS — Z853 Personal history of malignant neoplasm of breast: Secondary | ICD-10-CM

## 2016-09-18 DIAGNOSIS — C9002 Multiple myeloma in relapse: Secondary | ICD-10-CM

## 2016-09-18 DIAGNOSIS — C9000 Multiple myeloma not having achieved remission: Secondary | ICD-10-CM

## 2016-09-18 DIAGNOSIS — N185 Chronic kidney disease, stage 5: Secondary | ICD-10-CM | POA: Diagnosis not present

## 2016-09-18 DIAGNOSIS — G620 Drug-induced polyneuropathy: Secondary | ICD-10-CM | POA: Diagnosis not present

## 2016-09-18 DIAGNOSIS — J439 Emphysema, unspecified: Secondary | ICD-10-CM

## 2016-09-18 LAB — CBC WITH DIFFERENTIAL/PLATELET
BASO%: 0.5 % (ref 0.0–2.0)
BASOS ABS: 0 10*3/uL (ref 0.0–0.1)
EOS%: 4.5 % (ref 0.0–7.0)
Eosinophils Absolute: 0.2 10*3/uL (ref 0.0–0.5)
HEMATOCRIT: 27 % — AB (ref 34.8–46.6)
HGB: 9 g/dL — ABNORMAL LOW (ref 11.6–15.9)
LYMPH#: 0.9 10*3/uL (ref 0.9–3.3)
LYMPH%: 25.6 % (ref 14.0–49.7)
MCH: 37.6 pg — AB (ref 25.1–34.0)
MCHC: 33.5 g/dL (ref 31.5–36.0)
MCV: 112.1 fL — ABNORMAL HIGH (ref 79.5–101.0)
MONO#: 0.6 10*3/uL (ref 0.1–0.9)
MONO%: 17 % — ABNORMAL HIGH (ref 0.0–14.0)
NEUT#: 1.8 10*3/uL (ref 1.5–6.5)
NEUT%: 52.4 % (ref 38.4–76.8)
PLATELETS: 94 10*3/uL — AB (ref 145–400)
RBC: 2.41 10*6/uL — AB (ref 3.70–5.45)
RDW: 15.1 % — ABNORMAL HIGH (ref 11.2–14.5)
WBC: 3.5 10*3/uL — ABNORMAL LOW (ref 3.9–10.3)

## 2016-09-18 LAB — COMPREHENSIVE METABOLIC PANEL
ALT: 20 U/L (ref 0–55)
ANION GAP: 8 meq/L (ref 3–11)
AST: 18 U/L (ref 5–34)
Albumin: 3.5 g/dL (ref 3.5–5.0)
Alkaline Phosphatase: 117 U/L (ref 40–150)
BUN: 22.6 mg/dL (ref 7.0–26.0)
CALCIUM: 8.6 mg/dL (ref 8.4–10.4)
CHLORIDE: 111 meq/L — AB (ref 98–109)
CO2: 22 meq/L (ref 22–29)
Creatinine: 2.6 mg/dL — ABNORMAL HIGH (ref 0.6–1.1)
EGFR: 18 mL/min/{1.73_m2} — ABNORMAL LOW (ref >90)
Glucose: 122 mg/dl (ref 70–140)
POTASSIUM: 3.7 meq/L (ref 3.5–5.1)
Sodium: 141 mEq/L (ref 136–145)
Total Bilirubin: 1.25 mg/dL — ABNORMAL HIGH (ref 0.20–1.20)
Total Protein: 5.8 g/dL — ABNORMAL LOW (ref 6.4–8.3)

## 2016-09-18 MED ORDER — ONDANSETRON HCL 8 MG PO TABS
ORAL_TABLET | ORAL | Status: AC
  Filled 2016-09-18: qty 1

## 2016-09-18 MED ORDER — SODIUM CHLORIDE 0.9 % IJ SOLN
10.0000 mL | INTRAMUSCULAR | Status: DC | PRN
  Administered 2016-09-18: 10 mL
  Filled 2016-09-18: qty 10

## 2016-09-18 MED ORDER — SODIUM CHLORIDE 0.9 % IV SOLN
Freq: Once | INTRAVENOUS | Status: AC
  Administered 2016-09-18: 11:00:00 via INTRAVENOUS

## 2016-09-18 MED ORDER — DEXAMETHASONE SODIUM PHOSPHATE 10 MG/ML IJ SOLN
10.0000 mg | Freq: Once | INTRAMUSCULAR | Status: AC
  Administered 2016-09-18: 10 mg via INTRAVENOUS

## 2016-09-18 MED ORDER — SODIUM CHLORIDE 0.9 % IV SOLN
225.0000 mg/m2 | Freq: Once | INTRAVENOUS | Status: AC
  Administered 2016-09-18: 420 mg via INTRAVENOUS
  Filled 2016-09-18: qty 21

## 2016-09-18 MED ORDER — DEXAMETHASONE SODIUM PHOSPHATE 10 MG/ML IJ SOLN
INTRAMUSCULAR | Status: AC
  Filled 2016-09-18: qty 1

## 2016-09-18 MED ORDER — ONDANSETRON HCL 8 MG PO TABS
8.0000 mg | ORAL_TABLET | Freq: Once | ORAL | Status: AC
  Administered 2016-09-18: 8 mg via ORAL

## 2016-09-18 MED ORDER — DEXTROSE 5 % IV SOLN
27.0000 mg/m2 | Freq: Once | INTRAVENOUS | Status: AC
  Administered 2016-09-18: 50 mg via INTRAVENOUS
  Filled 2016-09-18: qty 25

## 2016-09-18 MED ORDER — HEPARIN SOD (PORK) LOCK FLUSH 100 UNIT/ML IV SOLN
500.0000 [IU] | Freq: Once | INTRAVENOUS | Status: AC | PRN
  Administered 2016-09-18: 500 [IU]
  Filled 2016-09-18: qty 5

## 2016-09-18 MED ORDER — SODIUM CHLORIDE 0.9% FLUSH
10.0000 mL | Freq: Once | INTRAVENOUS | Status: AC
  Administered 2016-09-18: 10 mL
  Filled 2016-09-18: qty 10

## 2016-09-18 NOTE — Progress Notes (Signed)
Per Dr. Julien Nordmann, okay to treat pt with crt of 2.6 and platelets of 94

## 2016-09-18 NOTE — Patient Instructions (Signed)
[facility] Discharge Instructions for Patients Receiving Chemotherapy  Today you received the following chemotherapy agents Cytoxan and Kyprolis  To help prevent nausea and vomiting after your treatment, we encourage you to take your nausea medication as directed   If you develop nausea and vomiting that is not controlled by your nausea medication, call the clinic.   BELOW ARE SYMPTOMS THAT SHOULD BE REPORTED IMMEDIATELY:  *FEVER GREATER THAN 100.5 F  *CHILLS WITH OR WITHOUT FEVER  NAUSEA AND VOMITING THAT IS NOT CONTROLLED WITH YOUR NAUSEA MEDICATION  *UNUSUAL SHORTNESS OF BREATH  *UNUSUAL BRUISING OR BLEEDING  TENDERNESS IN MOUTH AND THROAT WITH OR WITHOUT PRESENCE OF ULCERS  *URINARY PROBLEMS  *BOWEL PROBLEMS  UNUSUAL RASH Items with * indicate a potential emergency and should be followed up as soon as possible.  Feel free to call the clinic you have any questions or concerns. The clinic phone number is [phone].  Please show the [facility] at check-in to the Emergency Department and triage nurse.

## 2016-09-18 NOTE — Progress Notes (Signed)
[facility] [phone]   [phone]  OFFICE PROGRESS NOTE  [name] [place] [address]  DIAGNOSIS:  1. 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 Recurrent multiple myeloma initially diagnosed as a smoldering myeloma at [facility] in 2002. 3. Ductal carcinoma in situ status post mastectomy with sentinel lymph node biopsy in October 2008.  PRIOR THERAPY: 1. Status post treatment with tamoxifen from November 2008 through February 2009, discontinued secondary to intolerance. 2. Status post 3 cycles of chemotherapy with Revlimid and Decadron followed by 1 cycle of Decadron only with mild response. 3. Status post 2 cycles of systemic chemotherapy with Velcade, Doxil and Decadron discontinued secondary to significant peripheral neuropathy. Last dose was given May 2010 at [facility]. 4. Status post autologous peripheral blood stem cell transplant on October 01, 2008 at [facility] under the care of Dr. Phyllis Ginger.  5. Systemic chemotherapy with Carfilzomib initially was 20 mg/M2 and will be increased after cycle #1 to 36 mg/M2 on days 1, 2, 8, 9, 15 and 16 every 4 weeks in addition to Cytoxan 300 mg/M2 and Decadron 40 mg by mouth weekly basis, status post 4 cycles. First cycle on 12/29/2012. 6. She resumed chemotherapy with Carfilzomib, Cytoxan, and Decadron on 05/03/2014. Status post 6 cycles. 7. systemic chemotherapy again with Carfilzomib, Cytoxan and Decadron on 07/11/2015. Status post 3 cycles. 8. She completed a course of treatment with Diflucan 200 mg by mouth daily for around 6 weeks for cryptococcal infection of the lung.  CURRENT THERAPY: Resuming treatment again with Carfilzomib, Cytoxan and Decadron, first cycle 04/23/2016. Status post 6 cycles  INTERVAL HISTORY: Nancy Norris 76 y.o. female returns to the clinic today for follow-up visit. The  patient is feeling fine today with no specific complaints except for fatigue. Unfortunately she lost her mother recently at age 48. The patient denied having any chest pain but has occasional shortness of breath and she was seen recently at urgent care and repeat chest x-ray showed decrease in the size of the cavitary lesion in the left lung. She denied having any cough or hemoptysis. She denied having any recent weight loss or night sweats. The patient denied having any nausea, vomiting, diarrhea or constipation. She is here today for evaluation before starting cycle #7 of her treatment.  MEDICAL HISTORY: Past Medical History:  Diagnosis Date  . Anemia   . Anginal pain ([place])    used NTG x 2 May 31 and 06/15/13   . Anxiety   . Arthritis   . B12 deficiency 12/04/2014  . Breast cancer ([place])   . CKD (chronic kidney disease) stage 3, GFR 30-59 ml/min   . Complication of anesthesia   . COPD (chronic obstructive pulmonary disease) ([place])   . Cryptococcal pneumonitis ([place]) 11/22/2015  . Depression   . Dizziness   . Dyspnea   . Fibromyalgia   . Fibromyalgia   . GERD (gastroesophageal reflux disease)   . Headache(784.0)   . Heart murmur   . Hemoptysis 10/21/2015  . History of blood transfusion    last one May 12   . Hx of cardiovascular stress test    LexiScan with low level exercise Myoview (02/2013): No ischemia, EF 72%; normal study  . Hx of echocardiogram    a.  Echocardiogram (12/26/2012): EF 19-50%, grade 1 diastolic dysfunction;   b.  Echocardiogram (02/2013): EF 55-60%, no WMA, trivial  effusion  . Hyperkalemia   . Hyponatremia   . Hypothyroidism   . Mucositis   . Multiple myeloma   . Myocardial infarction [facility])    in past, patient was unaware.   . Neuropathy   . Pain in joint, pelvic region and thigh 07/07/2015  . PONV (postoperative nausea and vomiting) 2008   after mastestomy    ALLERGIES:  is allergic to codeine; latex; other; onion; zyprexa [olanzapine]; adhesive [tape];  hydrocodone; iodinated diagnostic agents; and sulfa antibiotics.  MEDICATIONS:  Current Outpatient Prescriptions  Medication Sig Dispense Refill  . acetaminophen (TYLENOL) 325 MG tablet Take 650 mg by mouth every 6 (six) hours as needed for mild pain or moderate pain.     Marland Kitchen albuterol (PROVENTIL HFA;VENTOLIN HFA) 108 (90 Base) MCG/ACT inhaler Inhale 1-2 puffs into the lungs every 6 (six) hours as needed for wheezing or shortness of breath (cough). 1 Inhaler 3  . buPROPion (WELLBUTRIN XL) 300 MG 24 hr tablet TAKE 1 TABLET BY MOUTH EACH MORNING FOR MOOD AND DEPRESSION. 90 tablet 1  . butalbital-acetaminophen-caffeine (FIORICET, ESGIC) 50-325-40 MG tablet TAKE 1 OR 2 TABLETS BY MOUTH EVERY 4 HOURS AS NEEDED FOR PAIN OR HEADACHE. 50 tablet 3  . Cholecalciferol 4000 UNITS TABS Take 4,000 Units by mouth daily.    . citalopram (CELEXA) 40 MG tablet TAKE 1 TABLET BY MOUTH DAILY OR AS DIRECTED 90 tablet 1  . cromolyn (OPTICROM) 4 % ophthalmic solution Place 1 drop into both eyes 4 (four) times daily.    . fluconazole (DIFLUCAN) 200 MG tablet Take 1 tablet (200 mg total) by mouth daily. 30 tablet 5  . gabapentin (NEURONTIN) 300 MG capsule TAKE 1 CAPSULE BY MOUTH AT BEDTIME. 90 capsule 1  . ipratropium (ATROVENT) 0.06 % nasal spray Place 2 sprays into both nostrils 4 (four) times daily. 15 mL 0  . isosorbide mononitrate (IMDUR) 30 MG 24 hr tablet Take 1 tablet (30 mg total) by mouth daily. 90 tablet 3  . levothyroxine (SYNTHROID, LEVOTHROID) 150 MCG tablet Take 1 tablet (150 mcg total) by mouth daily. (Patient taking differently: Take 150 mcg by mouth daily. ) 90 tablet 1  . loperamide (IMODIUM) 2 MG capsule Take 1 capsule (2 mg total) by mouth as needed for diarrhea or loose stools. 30 capsule 2  . loteprednol (ALREX) 0.2 % SUSP Place 1 drop into both eyes 4 (four) times daily. 1 Bottle PRN  . magic mouthwash SOLN Take 5 mLs by mouth 4 (four) times daily as needed for mouth pain. 240 mL 1  . meloxicam  (MOBIC) 15 MG tablet Take 1 tablet (15 mg total) by mouth daily. 90 tablet 3  . midodrine (PROAMATINE) 10 MG tablet TAKE 1 TABLET BY MOUTH 3 TIMES DAILY. 270 tablet 2  . montelukast (SINGULAIR) 10 MG tablet TAKE 1 TABLET BY MOUTH DAILY. 90 tablet 1  . nitroGLYCERIN (NITROSTAT) 0.4 MG SL tablet Place 0.4 mg under the tongue every 5 (five) minutes as needed for chest pain. Reported on 03/30/2015    . nystatin (MYCOSTATIN/NYSTOP) powder Apply topically 2 (two) times daily. 30 g 0  . PRESCRIPTION MEDICATION Pt gets chemo at [facility].    . ranitidine (ZANTAC) 300 MG tablet Take 1 to 2 tablets daily for heartburn & reflux 180 tablet 1  . senna (SENOKOT) 8.6 MG tablet Take 1 tablet by mouth at bedtime.    . traMADol (ULTRAM) 50 MG tablet Take 1 to 2 tablets by mouth 4 times a day as needed  for pain. 180 tablet 0  . vitamin C (ASCORBIC ACID) 250 MG tablet Take 250 mg by mouth daily.    Marland Kitchen amphetamine-dextroamphetamine (ADDERALL) 20 MG tablet Take 1/2 to 1 tablet 1 or 2 x / day for focusing/concentration & Depression 60 tablet 0  . azelastine (ASTELIN) 0.1 % nasal spray Place 2 sprays into both nostrils 2 (two) times daily. Use in each nostril as directed 30 mL 2  . ferrous sulfate 325 (65 FE) MG tablet Take 1 tablet daily or as directed 90 tablet 1   No current facility-administered medications for this visit.     SURGICAL HISTORY:  Past Surgical History:  Procedure Laterality Date  . ABDOMINAL HYSTERECTOMY  1981  . AV FISTULA PLACEMENT Left 06/19/2013   Procedure: CREATION OF LEFT ARM ARTERIOVENOUS (AV) FISTULA ;  Surgeon: Angelia Mould, MD;  Location: [facility];  Service: Vascular;  Laterality: Left;  . BREAST RECONSTRUCTION    . BREAST SURGERY    . CATARACT EXTRACTION, BILATERAL    . CHOLECYSTECTOMY  1971  . EYE SURGERY Bilateral    lens implant  . history of Port removal    . LUNG BIOPSY  10/21/2015  . MASTECTOMY Left 2008  . PORTACATH PLACEMENT  12/2012   has had 2  . Status post stem cell  transplant on September 28, 2008.      REVIEW OF SYSTEMS:  A comprehensive review of systems was negative except for: Constitutional: positive for fatigue   PHYSICAL EXAMINATION: General appearance: alert, cooperative, fatigued and no distress Head: Normocephalic, without obvious abnormality, atraumatic Neck: no adenopathy, no JVD, supple, symmetrical, trachea midline and thyroid not enlarged, symmetric, no tenderness/mass/nodules Lymph nodes: Cervical, supraclavicular, and axillary nodes normal. Resp: clear to auscultation bilaterally Back: symmetric, no curvature. ROM normal. No CVA tenderness. Cardio: regular rate and rhythm, S1, S2 normal, no murmur, click, rub or gallop GI: soft, non-tender; bowel sounds normal; no masses,  no organomegaly Extremities: extremities normal, atraumatic, no cyanosis or edema  ECOG PERFORMANCE STATUS: 1 - Symptomatic but completely ambulatory  Blood pressure 127/60, pulse 77, temperature 98.4 F (36.9 C), temperature source Oral, resp. rate 18, weight 159 lb 1.6 oz (72.2 kg), SpO2 100 %.  LABORATORY DATA: Lab Results  Component Value Date   WBC 3.5 (L) 09/18/2016   HGB 9.0 (L) 09/18/2016   HCT 27.0 (L) 09/18/2016   MCV 112.1 (H) 09/18/2016   PLT 94 (L) 09/18/2016      Chemistry      Component Value Date/Time   NA 141 09/18/2016 0835   K 3.7 09/18/2016 0835   CL 106 02/20/2016 1213   CL 105 03/19/2012 0811   CO2 22 09/18/2016 0835   BUN 22.6 09/18/2016 0835   CREATININE 2.6 (H) 09/18/2016 0835      Component Value Date/Time   CALCIUM 8.6 09/18/2016 0835   ALKPHOS 117 09/18/2016 0835   AST 18 09/18/2016 0835   ALT 20 09/18/2016 0835   BILITOT 1.25 (H) 09/18/2016 0835       RADIOGRAPHIC STUDIES: Dg Chest 2 View  Result Date: 09/14/2016 CLINICAL DATA:  Chest tightness with dry cough and low grade fever EXAM: CHEST  2 VIEW COMPARISON:  CT 04/17/2016, radiograph 11/04/2015 FINDINGS: Right-sided central venous port tip over the  cavoatrial junction. Decreased left mid lung mass lesions since prior radiograph. Coarse lower lung interstitial opacity, similar compared to prior radiograph and suspected to be chronic. No definite acute consolidation or effusion is seen. Stable cardiomediastinal silhouette  with atherosclerosis. No pneumothorax. IMPRESSION: No active cardiopulmonary disease. Decreased left mid lung mass lesions since the prior radiograph. Electronically Signed   By: Donavan Foil M.D.   On: 09/14/2016 18:50    ASSESSMENT AND PLAN:  This is a very pleasant 76 years old white female with relapsed multiple myeloma status post several chemotherapy regimens and she is currently on treatment with systemic chemotherapy with Carfilzomib, Cytoxan and Decadron status post 6 cycles. The patient continues to tolerate this treatment fairly well with no significant adverse effects. I recommended for the patient to proceed with cycle #7 today as a scheduled. I will see her back for follow-up visit in 4 weeks for evaluation after repeating myeloma panel. The patient was advised to call immediately if she has any concerning findings in the interval. For the thrombocytopenia, I will continue to monitor her platelets count closely. For the chronic renal insufficiency, she is followed by nephrology. For the peripheral neuropathy, she will continue her current treatment with Lyrica. I will see her back for follow-up visit in one month's for reevaluation before starting cycle #8. The patient voices understanding of current disease status and treatment options and is in agreement with the current care plan. All questions were answered. The patient knows to call the clinic with any problems, questions or concerns. We can certainly see the patient much sooner if necessary.  Disclaimer: This note was dictated with voice recognition software. Similar sounding words can inadvertently be transcribed and may not be corrected upon review.

## 2016-09-19 ENCOUNTER — Ambulatory Visit (HOSPITAL_COMMUNITY)
Admission: RE | Admit: 2016-09-19 | Discharge: 2016-09-19 | Disposition: A | Discharge status: Home / Self Care | Payer: Medicare Other | Source: Ambulatory Visit | Attending: Oncology | Admitting: Oncology

## 2016-09-19 ENCOUNTER — Ambulatory Visit (HOSPITAL_BASED_OUTPATIENT_CLINIC_OR_DEPARTMENT_OTHER): Payer: Medicare Other | Admitting: Oncology

## 2016-09-19 ENCOUNTER — Ambulatory Visit (HOSPITAL_COMMUNITY)
Admission: RE | Admit: 2016-09-19 | Discharge: 2016-09-19 | Disposition: A | Discharge status: Home / Self Care | Payer: Medicare Other | Source: Ambulatory Visit | Attending: Internal Medicine | Admitting: Internal Medicine

## 2016-09-19 ENCOUNTER — Ambulatory Visit (HOSPITAL_BASED_OUTPATIENT_CLINIC_OR_DEPARTMENT_OTHER): Payer: Medicare Other

## 2016-09-19 ENCOUNTER — Telehealth: Payer: Self-pay | Admitting: Oncology

## 2016-09-19 ENCOUNTER — Telehealth: Payer: Self-pay | Admitting: Internal Medicine

## 2016-09-19 ENCOUNTER — Other Ambulatory Visit: Payer: Self-pay | Admitting: Oncology

## 2016-09-19 ENCOUNTER — Ambulatory Visit: Payer: Medicare Other

## 2016-09-19 VITALS — BP 144/60 | HR 88 | Temp 101.6°F | Resp 18

## 2016-09-19 DIAGNOSIS — Z95828 Presence of other vascular implants and grafts: Secondary | ICD-10-CM

## 2016-09-19 DIAGNOSIS — R918 Other nonspecific abnormal finding of lung field: Secondary | ICD-10-CM | POA: Insufficient documentation

## 2016-09-19 DIAGNOSIS — D630 Anemia in neoplastic disease: Secondary | ICD-10-CM | POA: Insufficient documentation

## 2016-09-19 DIAGNOSIS — C9002 Multiple myeloma in relapse: Secondary | ICD-10-CM

## 2016-09-19 DIAGNOSIS — R509 Fever, unspecified: Secondary | ICD-10-CM | POA: Insufficient documentation

## 2016-09-19 DIAGNOSIS — J181 Lobar pneumonia, unspecified organism: Secondary | ICD-10-CM | POA: Diagnosis not present

## 2016-09-19 DIAGNOSIS — R0602 Shortness of breath: Secondary | ICD-10-CM | POA: Diagnosis not present

## 2016-09-19 DIAGNOSIS — C801 Malignant (primary) neoplasm, unspecified: Secondary | ICD-10-CM | POA: Diagnosis not present

## 2016-09-19 DIAGNOSIS — C9000 Multiple myeloma not having achieved remission: Secondary | ICD-10-CM

## 2016-09-19 DIAGNOSIS — J189 Pneumonia, unspecified organism: Secondary | ICD-10-CM

## 2016-09-19 DIAGNOSIS — R05 Cough: Secondary | ICD-10-CM | POA: Insufficient documentation

## 2016-09-19 DIAGNOSIS — R112 Nausea with vomiting, unspecified: Secondary | ICD-10-CM

## 2016-09-19 LAB — CBC WITH DIFFERENTIAL/PLATELET
BASO%: 0 % (ref 0.0–2.0)
BASOS ABS: 0 10*3/uL (ref 0.0–0.1)
EOS ABS: 0 10*3/uL (ref 0.0–0.5)
EOS%: 0.3 % (ref 0.0–7.0)
HCT: 23.3 % — ABNORMAL LOW (ref 34.8–46.6)
HEMOGLOBIN: 7.5 g/dL — AB (ref 11.6–15.9)
LYMPH%: 8 % — AB (ref 14.0–49.7)
MCH: 36.2 pg — AB (ref 25.1–34.0)
MCHC: 32.2 g/dL (ref 31.5–36.0)
MCV: 112.6 fL — ABNORMAL HIGH (ref 79.5–101.0)
MONO#: 0.2 10*3/uL (ref 0.1–0.9)
MONO%: 6.4 % (ref 0.0–14.0)
NEUT#: 2.7 10*3/uL (ref 1.5–6.5)
NEUT%: 85.3 % — ABNORMAL HIGH (ref 38.4–76.8)
Platelets: 60 10*3/uL — ABNORMAL LOW (ref 145–400)
RBC: 2.07 10*6/uL — ABNORMAL LOW (ref 3.70–5.45)
RDW: 17 % — AB (ref 11.2–14.5)
WBC: 3.1 10*3/uL — ABNORMAL LOW (ref 3.9–10.3)
lymph#: 0.3 10*3/uL — ABNORMAL LOW (ref 0.9–3.3)

## 2016-09-19 LAB — URINALYSIS, MICROSCOPIC - CHCC
BILIRUBIN (URINE): NEGATIVE
GLUCOSE UR CHCC: NEGATIVE mg/dL
Ketones: NEGATIVE mg/dL
Leukocyte Esterase: NEGATIVE
NITRITE: NEGATIVE
PH: 6 (ref 4.6–8.0)
PROTEIN: 100 mg/dL
Specific Gravity, Urine: 1.01 (ref 1.003–1.035)
Urobilinogen, UR: 0.2 mg/dL (ref 0.2–1)

## 2016-09-19 LAB — COMPREHENSIVE METABOLIC PANEL
ALBUMIN: 3.4 g/dL — AB (ref 3.5–5.0)
ALK PHOS: 119 U/L (ref 40–150)
ALT: 18 U/L (ref 0–55)
AST: 18 U/L (ref 5–34)
Anion Gap: 10 mEq/L (ref 3–11)
BUN: 29.5 mg/dL — ABNORMAL HIGH (ref 7.0–26.0)
CO2: 17 mEq/L — ABNORMAL LOW (ref 22–29)
Calcium: 8.5 mg/dL (ref 8.4–10.4)
Chloride: 107 mEq/L (ref 98–109)
Creatinine: 2.6 mg/dL — ABNORMAL HIGH (ref 0.6–1.1)
EGFR: 17 mL/min/{1.73_m2} — AB (ref >90)
GLUCOSE: 129 mg/dL (ref 70–140)
Potassium: 3.7 mEq/L (ref 3.5–5.1)
SODIUM: 135 meq/L — AB (ref 136–145)
Total Bilirubin: 0.86 mg/dL (ref 0.20–1.20)
Total Protein: 5.5 g/dL — ABNORMAL LOW (ref 6.4–8.3)

## 2016-09-19 LAB — PREPARE RBC (CROSSMATCH)

## 2016-09-19 MED ORDER — HEPARIN SOD (PORK) LOCK FLUSH 100 UNIT/ML IV SOLN
500.0000 [IU] | Freq: Once | INTRAVENOUS | Status: AC
  Administered 2016-09-19: 500 [IU] via INTRAVENOUS
  Filled 2016-09-19: qty 5

## 2016-09-19 MED ORDER — DEXTROSE 5 % IV SOLN
1.0000 g | INTRAVENOUS | Status: DC
  Administered 2016-09-19: 1 g via INTRAVENOUS
  Filled 2016-09-19: qty 1

## 2016-09-19 MED ORDER — SODIUM CHLORIDE 0.9% FLUSH
10.0000 mL | INTRAVENOUS | Status: DC | PRN
  Administered 2016-09-19: 10 mL via INTRAVENOUS
  Filled 2016-09-19: qty 10

## 2016-09-19 MED ORDER — AMOXICILLIN-POT CLAVULANATE 500-125 MG PO TABS: 1.0000 | ORAL_TABLET | Freq: Two times a day (BID) | ORAL | 0 refills | Status: DC

## 2016-09-19 MED ORDER — SODIUM CHLORIDE 0.9 % IV SOLN
Freq: Once | INTRAVENOUS | Status: AC
  Administered 2016-09-19: 09:00:00 via INTRAVENOUS

## 2016-09-19 MED ORDER — FLUCONAZOLE 200 MG PO TABS: 200.0000 mg | ORAL_TABLET | Freq: Every day | ORAL | 0 refills | Status: DC

## 2016-09-19 MED ORDER — ACETAMINOPHEN 500 MG PO TABS
1000.0000 mg | ORAL_TABLET | Freq: Once | ORAL | Status: AC
  Administered 2016-09-19: 1000 mg via ORAL

## 2016-09-19 MED ORDER — ACETAMINOPHEN 500 MG PO TABS
ORAL_TABLET | ORAL | Status: AC
  Filled 2016-09-19: qty 2

## 2016-09-19 NOTE — Assessment & Plan Note (Signed)
This is a very pleasant 76 years old white female with relapsed multiple myeloma status post several chemotherapy regimens and she is currently on treatment with systemic chemotherapy with Carfilzomib, Cytoxan and Decadron status post 6 cycles.  The patient is her for cycle 7 day 2 of treatment. Due to fever and chills, will hold chemo today.

## 2016-09-19 NOTE — Assessment & Plan Note (Signed)
Hemoglobin is 7.5 today. She has fatigue and shortness of breath. Patient will receive 2 units PRBCs this week. Patient set up for transfusion on 09/20/16. Type and crossmatch drawn today. Orders placed for transfusion on 09/20/16.

## 2016-09-19 NOTE — Progress Notes (Signed)
Per Cyril Mourning NP treatment held today, d/t VS and assessment, Kristen NP at chairside to see pt, labs and cultures obtained per order.  1106: Pt stable at discharge and educated to call clinic with any concerns, pt to have blood transfusion on 09/20/16, pt aware and verbalizes understanding.

## 2016-09-19 NOTE — Telephone Encounter (Signed)
Rn - unable to print schedule for patient - called and left message with appt date and time. Send reminder letter in the mail.

## 2016-09-19 NOTE — Patient Instructions (Signed)
Begin Augmentin tonight. Take twice a day.   Blood transfusion is scheduled for tomorrow at the [facility].   Monitor temp and call us if unable to keep fever down or feeling more short of breath/increased cough.

## 2016-09-19 NOTE — Assessment & Plan Note (Signed)
Patient presented with nausea and vomiting. She took her Zofran from home and was able to keep this down. Patient able to drink during her time in infusion without further nausea or vomiting.

## 2016-09-19 NOTE — Assessment & Plan Note (Addendum)
Patient presented with a fever today. CBC, CMET, BC X2, U/A, C&S, and CXR obtained. CXR shows early pneumonia in the left lower lobe.   Patient given Tylenol for fever. Rocephin 1 gram IV given in our office. Prescription for Augmentin 500/125 mg BID sent to pharmacy (renal dose verified with [facility]). Patient is prone to developing yeast infections while on antibiotics. A prescription for Diflucan sent to pharmacy.   She was encouraged to use Tylenol as needed for fever. Instructed to call us if not improving or if develops worsening cough or shortness of breath.

## 2016-09-19 NOTE — Progress Notes (Signed)
Nancy Norris Follow up:    [name], MD [address] [place]  [address]   DIAGNOSIS:  1. 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 Recurrent multiple myeloma initially diagnosed as a smoldering myeloma at [facility] in 2002. 3. Ductal carcinoma in situ status post mastectomy with sentinel lymph node biopsy in October 2008.  SUMMARY OF ONCOLOGIC HISTORY:  No history exists.   PRIOR THERAPY: 1. Status post treatment with tamoxifen from November 2008 through February 2009, discontinued secondary to intolerance. 2. Status post 3 cycles of chemotherapy with Revlimid and Decadron followed by 1 cycle of Decadron only with mild response. 3. Status post 2 cycles of systemic chemotherapy with Velcade, Doxil and Decadron discontinued secondary to significant peripheral neuropathy. Last dose was given May 2010 at [facility]. 4. Status post autologous peripheral blood stem cell transplant on October 01, 2008 at [facility] under the care of Dr. Phyllis Ginger.  5. Systemic chemotherapy with Carfilzomib initially was 20 mg/M2 and will be increased after cycle #1 to 36 mg/M2 on days 1, 2, 8, 9, 15 and 16 every 4 weeks in addition to Cytoxan 300 mg/M2 and Decadron 40 mg by mouth weekly basis, status post 4 cycles. First cycle on 12/29/2012. 6. She resumed chemotherapy with Carfilzomib, Cytoxan, and Decadron on 05/03/2014. Status post 6 cycles. 7. systemic chemotherapy again with Carfilzomib, Cytoxan and Decadron on 07/11/2015. Status post 3 cycles. 8. She completed a course of treatment with Diflucan 200 mg by mouth daily for around 6 weeks for cryptococcal infection of the lung.  CURRENT THERAPY: Resuming treatment again with Carfilzomib, Cytoxan and Decadron, first cycle 04/23/2016. Status post 6 cycles  INTERVAL HISTORY: Nancy Norris 76 y.o. female is seen today per the request of nursing in the infusion  room. The patient returned this morning for cycle 7 day 2 of her chemo. Patient was found to have a temperature of 102.9 and chills. She also had nausea and was unable to keep anything down. Patient tells me that fever started around 3 am. Has not taken anything for her fever. Reports increased nonproductive cough. No hemoptysis. Has had nausea and vomiting since this time. Has home Zofran with her in infusion today. Denies diarrhea. Denies dysuria, urgency, frequency, and hematuria. Patient is seen for evaluation of symptoms.    Patient Active Problem List   Diagnosis Date Noted  . Nausea with vomiting 09/19/2016  . Port catheter in place 09/18/2016  . Vaginal yeast infection 01/20/2016  . Cryptococcal pneumonitis ([place]) 11/22/2015  . Lung mass 09/28/2015  . Nodule of left lung 09/13/2015  . Pneumonia 08/23/2015  . Fever 07/13/2015  . Multiple myeloma not having achieved remission ([place]) 08/02/202017  . Anemia in neoplastic disease 03/31/2015  . B12 deficiency 12/04/2014  . Thrombocytopenia ([place]) 12/04/2014  . CHF (congestive heart failure) ([place]) 07/15/2014  . Encounter for antineoplastic chemotherapy 06/21/2014  . Orthostatic hypotension 04/02/2014  . CKD (chronic kidney disease) stage 5, GFR less than 15 ml/min (HCC) 04/02/2014  . Multiple myeloma ([place]) 12/02/2013  . Depression, controlled 11/02/2013  . Medication management 09/30/2013  . Vitamin D deficiency 03/24/2013  . Mixed hyperlipidemia 02/24/2013  . Prediabetes 02/24/2013  . Fibromyalgia   . COPD (chronic obstructive pulmonary disease) with emphysema ([place]) 02/20/2013  . GERD (gastroesophageal reflux disease) 01/20/2013  . HTN (hypertension) 01/14/2013  . Asthma, chronic 01/13/2013  . Hypothyroid 01/13/2013  . Chronic diastolic heart failure ([place]) 01/13/2013  .  HX: breast Norris 12/08/2012  . Hypercalcemia 12/02/2012    is allergic to codeine; latex; other; onion; zyprexa [olanzapine]; adhesive [tape]; hydrocodone;  iodinated diagnostic agents; and sulfa antibiotics.  MEDICAL HISTORY: Past Medical History:  Diagnosis Date  . Anemia   . Anginal pain ([place])    used NTG x 2 May 31 and 06/15/13   . Anxiety   . Arthritis   . B12 deficiency 12/04/2014  . Breast Norris ([place])   . CKD (chronic kidney disease) stage 3, GFR 30-59 ml/min   . Complication of anesthesia   . COPD (chronic obstructive pulmonary disease) ([place])   . Cryptococcal pneumonitis ([place]) 11/22/2015  . Depression   . Dizziness   . Dyspnea   . Fibromyalgia   . Fibromyalgia   . GERD (gastroesophageal reflux disease)   . Headache(784.0)   . Heart murmur   . Hemoptysis 10/21/2015  . History of blood transfusion    last one May 12   . Hx of cardiovascular stress test    LexiScan with low level exercise Myoview (02/2013): No ischemia, EF 72%; normal study  . Hx of echocardiogram    a.  Echocardiogram (12/26/2012): EF 24-58%, grade 1 diastolic dysfunction;   b.  Echocardiogram (02/2013): EF 55-60%, no WMA, trivial effusion  . Hyperkalemia   . Hyponatremia   . Hypothyroidism   . Mucositis   . Multiple myeloma   . Myocardial infarction [facility])    in past, patient was unaware.   . Neuropathy   . Pain in joint, pelvic region and thigh 07/07/2015  . PONV (postoperative nausea and vomiting) 2008   after mastestomy    SURGICAL HISTORY: Past Surgical History:  Procedure Laterality Date  . ABDOMINAL HYSTERECTOMY  1981  . AV FISTULA PLACEMENT Left 06/19/2013   Procedure: CREATION OF LEFT ARM ARTERIOVENOUS (AV) FISTULA ;  Surgeon: Angelia Mould, MD;  Location: [facility] [facility];  Service: Vascular;  Laterality: Left;  . BREAST RECONSTRUCTION    . BREAST SURGERY    . CATARACT EXTRACTION, BILATERAL    . CHOLECYSTECTOMY  1971  . EYE SURGERY Bilateral    lens implant  . history of Port removal    . LUNG BIOPSY  10/21/2015  . MASTECTOMY Left 2008  . PORTACATH PLACEMENT  12/2012   has had 2  . Status post stem cell transplant on September 28, 2008.       SOCIAL HISTORY: Social History   Social History  . Marital status: Single    Spouse name: N/A  . Number of children: 1  . Years of education: N/A   Occupational History  . retired-sect.Licensed conveyancer)    Social History Main Topics  . Smoking status: Former Smoker    Packs/day: 1.00    Years: 30.00    Types: Cigarettes    Quit date: 02/15/2005  . Smokeless tobacco: Never Used  . Alcohol use No  . Drug use: No  . Sexual activity: No   Other Topics Concern  . Not on file   Social History Narrative  . No narrative on file    FAMILY HISTORY: Family History  Problem Relation Age of Onset  . Arthritis Mother   . Asthma Mother   . Norris Sister   . Hyperlipidemia Brother     Review of Systems  Constitutional: Positive for chills, fatigue and fever.  HENT:   Negative for mouth sores and sore throat.   Eyes: Negative.   Respiratory: Positive for cough. Negative for hemoptysis, shortness  of breath and wheezing.   Cardiovascular: Negative.   Gastrointestinal: Positive for nausea and vomiting. Negative for abdominal pain, constipation and diarrhea.  Genitourinary: Negative.    Musculoskeletal: Negative.   Skin: Negative.   Neurological: Negative.   Hematological: Negative.   Psychiatric/Behavioral: Negative.       PHYSICAL EXAMINATION  ECOG PERFORMANCE STATUS: 1 - Symptomatic but completely ambulatory  Temperature 102.9, pulse 88, BP 144/60, respirations 18, O2 sat 98% on RA. Temp down to 101.6 after Tylenol.   Physical Exam  Constitutional: She is oriented to person, place, and time.  Chronically ill appearing female with obvious chills.   HENT:  Head: Normocephalic.  Mouth/Throat: Oropharynx is clear and moist. No oropharyngeal exudate.  Eyes: Conjunctivae are normal. Right eye exhibits no discharge. Left eye exhibits no discharge. No scleral icterus.  Neck: Normal range of motion. Neck supple. No JVD present.  Cardiovascular: Normal rate, regular rhythm,  normal heart sounds and intact distal pulses.   Pulmonary/Chest: Effort normal and breath sounds normal. No stridor. No respiratory distress. She has no wheezes. She has no rales.  Abdominal: Soft. Bowel sounds are normal. She exhibits no distension and no mass. There is no tenderness.  Musculoskeletal: Normal range of motion. She exhibits no edema.  Lymphadenopathy:    She has no cervical adenopathy.  Neurological: She is alert and oriented to person, place, and time. She exhibits normal muscle tone.  Skin: No rash noted.  Psychiatric: Mood, memory, affect and judgment normal.  Vitals reviewed.   LABORATORY DATA:  CBC    Component Value Date/Time   WBC 3.1 (L) 09/19/2016 0848   WBC 4.0 05/30/2016 1152   RBC 2.07 (L) 09/19/2016 0848   RBC 2.38 (L) 05/30/2016 1152   HGB 7.5 (L) 09/19/2016 0848   HCT 23.3 (L) 09/19/2016 0848   PLT 60 (L) 09/19/2016 0848   MCV 112.6 (H) 09/19/2016 0848   MCH 36.2 (H) 09/19/2016 0848   MCH 36.1 (H) 05/30/2016 1152   MCHC 32.2 09/19/2016 0848   MCHC 33.2 05/30/2016 1152   RDW 17.0 (H) 09/19/2016 0848   LYMPHSABS 0.3 (L) 09/19/2016 0848   MONOABS 0.2 09/19/2016 0848   EOSABS 0.0 09/19/2016 0848   BASOSABS 0.0 09/19/2016 0848    CMP     Component Value Date/Time   NA 135 (L) 09/19/2016 0848   K 3.7 09/19/2016 0848   CL 106 02/20/2016 1213   CL 105 03/19/2012 0811   CO2 17 (L) 09/19/2016 0848   GLUCOSE 129 09/19/2016 0848   GLUCOSE 94 03/19/2012 0811   BUN 29.5 (H) 09/19/2016 0848   CREATININE 2.6 (H) 09/19/2016 0848   CALCIUM 8.5 09/19/2016 0848   PROT 5.5 (L) 09/19/2016 0848   ALBUMIN 3.4 (L) 09/19/2016 0848   AST 18 09/19/2016 0848   ALT 18 09/19/2016 0848   ALKPHOS 119 09/19/2016 0848   BILITOT 0.86 09/19/2016 0848   GFRNONAA 14 (L) 02/20/2016 1213   GFRAA 16 (L) 02/20/2016 1213    RADIOGRAPHIC STUDIES:  Dg Chest 2 View  Result Date: 09/19/2016 CLINICAL DATA:  One week of cough and shortness of breath with chest discomfort.  Fever to 102.9 degrees. Currently on chemotherapy EXAM: CHEST  2 VIEW COMPARISON:  PA and lateral chest x-ray of September 14, 2016 FINDINGS: The lungs are adequately inflated. There is hazy increased density that projects in the left mid and lower lung. This is not entirely new. There is no pleural effusion or pneumothorax. The heart is top-normal in  size. The pulmonary vascularity is mildly cephalized. There is calcification in the wall of the thoracic aorta. The power port catheter tip projects over the distal third of the SVC. The bony structures are unremarkable. IMPRESSION: Findings suggestive of subsegmental atelectasis or early pneumonia in the left lower lobe. Minimal pulmonary vascular congestion may be due to cardiac or noncardiac causes. Electronically Signed   By: David  Martinique M.D.   On: 09/19/2016 10:08    ASSESSMENT and THERAPY PLAN:   Multiple myeloma ([place]) This is a very pleasant 76 years old white female with relapsed multiple myeloma status post several chemotherapy regimens and she is currently on treatment with systemic chemotherapy with Carfilzomib, Cytoxan and Decadron status post 6 cycles.  The patient is her for cycle 7 day 2 of treatment. Due to fever and chills, will hold chemo today.   Pneumonia Patient presented with a fever today. CBC, CMET, BC X2, U/A, C&S, and CXR obtained. CXR shows early pneumonia in the left lower lobe.   Patient given Tylenol for fever. Rocephin 1 gram IV given in our office. Prescription for Augmentin 500/125 mg BID sent to pharmacy (renal dose verified with [facility]). Patient is prone to developing yeast infections while on antibiotics. A prescription for Diflucan sent to pharmacy.   She was encouraged to use Tylenol as needed for fever. Instructed to call us if not improving or if develops worsening cough or shortness of breath.   Nausea with vomiting Patient presented with nausea and vomiting. She took her Zofran from home and  was able to keep this down. Patient able to drink during her time in infusion without further nausea or vomiting.   Anemia in neoplastic disease Hemoglobin is 7.5 today. She has fatigue and shortness of breath. Patient will receive 2 units PRBCs this week. Patient set up for transfusion on 09/20/16. Type and crossmatch drawn today. Orders placed for transfusion on 09/20/16.  Patient was seen with an plan reviewed with Dr. Julien Nordmann. Instructed patient to call for any worsening of symptoms. Keep previous follow-up as scheduled.    Orders Placed This Encounter  Procedures  . Practitioner attestation of consent    I, the ordering practitioner, attest that I have discussed with the patient the benefits, risks, side effects, alternatives, likelihood of achieving goals and potential problems during recovery for the procedure listed.    Standing Status:   Future    Standing Expiration Date:   09/19/2017    Order Specific Question:   Procedure    Answer:   Blood Product(s)  . Complete patient signature process for consent form    Standing Status:   Future    Standing Expiration Date:   09/19/2017  . Care order/instruction    Transfuse Parameters    Standing Status:   Future    Standing Expiration Date:   09/19/2017  . Hold Tube, Blood Bank    Standing Status:   Future    Number of Occurrences:   1    Standing Expiration Date:   09/19/2017  . Type and screen    Standing Status:   Future    Number of Occurrences:   1    Standing Expiration Date:   09/19/2017    All questions were answered. The patient knows to call the clinic with any problems, questions or concerns. We can certainly see the patient much sooner if necessary.  Mikey Bussing, NP 09/19/2016   ADDENDUM: Hematology/Oncology Attending: I had a face to face encounter  with the patient. I recommended her care plan. This is a very pleasant 76 years old white female with relapsed multiple myeloma currently on treatment with Carfilzomib, Cytoxan  and Decadron and continues to tolerate her treatment well. The patient has been complaining of increasing shortness of breath recently and she developed low-grade fever earlier today. I ordered chest x-ray which suspicious for early pneumonia. Will start the patient empirically on Augmentin for the next 7 days. She was also given a dose of Rocephin intravenously in the clinic today. For the nausea and vomiting, she will continue her current treatment with Zofran. For the anemia of chronic disease, we will arrange for the patient to have tenderness of PRBCs transfusion. She was advised to call immediately if she has any concerning symptoms in the interval.  Disclaimer: This note was dictated with voice recognition software. Similar sounding words can inadvertently be transcribed and may be missed upon review. [name], MD 09/23/16

## 2016-09-19 NOTE — Patient Instructions (Signed)
Dehydration, Adult Dehydration is a condition in which there is not enough fluid or water in the body. This happens when you lose more fluids than you take in. Important organs, such as the kidneys, brain, and heart, cannot function without a proper amount of fluids. Any loss of fluids from the body can lead to dehydration. Dehydration can range from mild to severe. This condition should be treated right away to prevent it from becoming severe. What are the causes? This condition may be caused by:  Vomiting.  Diarrhea.  Excessive sweating, such as from heat exposure or exercise.  Not drinking enough fluid, especially: ? When ill. ? While doing activity that requires a lot of energy.  Excessive urination.  Fever.  Infection.  Certain medicines, such as medicines that cause the body to lose excess fluid (diuretics).  Inability to access safe drinking water.  Reduced physical ability to get adequate water and food.  What increases the risk? This condition is more likely to develop in people:  Who have a poorly controlled long-term (chronic) illness, such as diabetes, heart disease, or kidney disease.  Who are age 65 or older.  Who are disabled.  Who live in a place with high altitude.  Who play endurance sports.  What are the signs or symptoms? Symptoms of mild dehydration may include:  Thirst.  Dry lips.  Slightly dry mouth.  Dry, warm skin.  Dizziness. Symptoms of moderate dehydration may include:  Very dry mouth.  Muscle cramps.  Dark urine. Urine may be the color of tea.  Decreased urine production.  Decreased tear production.  Heartbeat that is irregular or faster than normal (palpitations).  Headache.  Light-headedness, especially when you stand up from a sitting position.  Fainting (syncope). Symptoms of severe dehydration may include:  Changes in skin, such as: ? Cold and clammy skin. ? Blotchy (mottled) or pale skin. ? Skin that does  not quickly return to normal after being lightly pinched and released (poor skin turgor).  Changes in body fluids, such as: ? Extreme thirst. ? No tear production. ? Inability to sweat when body temperature is high, such as in hot weather. ? Very little urine production.  Changes in vital signs, such as: ? Weak pulse. ? Pulse that is more than 100 beats a minute when sitting still. ? Rapid breathing. ? Low blood pressure.  Other changes, such as: ? Sunken eyes. ? Cold hands and feet. ? Confusion. ? Lack of energy (lethargy). ? Difficulty waking up from sleep. ? Short-term weight loss. ? Unconsciousness. How is this diagnosed? This condition is diagnosed based on your symptoms and a physical exam. Blood and urine tests may be done to help confirm the diagnosis. How is this treated? Treatment for this condition depends on the severity. Mild or moderate dehydration can often be treated at home. Treatment should be started right away. Do not wait until dehydration becomes severe. Severe dehydration is an emergency and it needs to be treated in a hospital. Treatment for mild dehydration may include:  Drinking more fluids.  Replacing salts and minerals in your blood (electrolytes) that you may have lost. Treatment for moderate dehydration may include:  Drinking an oral rehydration solution (ORS). This is a drink that helps you replace fluids and electrolytes (rehydrate). It can be found at pharmacies and retail stores. Treatment for severe dehydration may include:  Receiving fluids through an IV tube.  Receiving an electrolyte solution through a feeding tube that is passed through your nose   and into your stomach (nasogastric tube, or NG tube).  Correcting any abnormalities in electrolytes.  Treating the underlying cause of dehydration. Follow these instructions at home:  If directed by your health care provider, drink an ORS: ? Make an ORS by following instructions on the  package. ? Start by drinking small amounts, about  cup (120 mL) every 5-10 minutes. ? Slowly increase how much you drink until you have taken the amount recommended by your health care provider.  Drink enough clear fluid to keep your urine clear or pale yellow. If you were told to drink an ORS, finish the ORS first, then start slowly drinking other clear fluids. Drink fluids such as: ? Water. Do not drink only water. Doing that can lead to having too little salt (sodium) in the body (hyponatremia). ? Ice chips. ? Fruit juice that you have added water to (diluted fruit juice). ? Low-calorie sports drinks.  Avoid: ? Alcohol. ? Drinks that contain a lot of sugar. These include high-calorie sports drinks, fruit juice that is not diluted, and soda. ? Caffeine. ? Foods that are greasy or contain a lot of fat or sugar.  Take over-the-counter and prescription medicines only as told by your health care provider.  Do not take sodium tablets. This can lead to having too much sodium in the body (hypernatremia).  Eat foods that contain a healthy balance of electrolytes, such as bananas, oranges, potatoes, tomatoes, and spinach.  Keep all follow-up visits as told by your health care provider. This is important. Contact a health care provider if:  You have abdominal pain that: ? Gets worse. ? Stays in one area (localizes).  You have a rash.  You have a stiff neck.  You are more irritable than usual.  You are sleepier or more difficult to wake up than usual.  You feel weak or dizzy.  You feel very thirsty.  You have urinated only a small amount of very dark urine over 6-8 hours. Get help right away if:  You have symptoms of severe dehydration.  You cannot drink fluids without vomiting.  Your symptoms get worse with treatment.  You have a fever.  You have a severe headache.  You have vomiting or diarrhea that: ? Gets worse. ? Does not go away.  You have blood or green matter  (bile) in your vomit.  You have blood in your stool. This may cause stool to look black and tarry.  You have not urinated in 6-8 hours.  You faint.  Your heart rate while sitting still is over 100 beats a minute.  You have trouble breathing. This information is not intended to replace advice given to you by your health care provider. Make sure you discuss any questions you have with your health care provider. Document Released: 01/01/2005 Document Revised: 07/29/2015 Document Reviewed: 02/25/2015 Elsevier Interactive Patient Education  2018 [employer] Inc.  

## 2016-09-19 NOTE — Telephone Encounter (Signed)
Scheduled appt per 9/4 los - patient has treatment today and is here per Mechele Claude in treatment area to tell patients Rn to printout an updated schedule for patient.

## 2016-09-19 NOTE — Telephone Encounter (Signed)
No 9/5 los.

## 2016-09-20 ENCOUNTER — Ambulatory Visit (HOSPITAL_COMMUNITY)
Admission: RE | Admit: 2016-09-20 | Discharge: 2016-09-20 | Disposition: A | Discharge status: Home / Self Care | Payer: Medicare Other | Source: Ambulatory Visit | Attending: Internal Medicine | Admitting: Internal Medicine

## 2016-09-20 ENCOUNTER — Other Ambulatory Visit: Payer: Self-pay | Admitting: Medical Oncology

## 2016-09-20 DIAGNOSIS — Z5189 Encounter for other specified aftercare: Secondary | ICD-10-CM

## 2016-09-20 DIAGNOSIS — D630 Anemia in neoplastic disease: Secondary | ICD-10-CM | POA: Diagnosis not present

## 2016-09-20 DIAGNOSIS — C801 Malignant (primary) neoplasm, unspecified: Secondary | ICD-10-CM | POA: Diagnosis not present

## 2016-09-20 LAB — URINE CULTURE

## 2016-09-20 MED ORDER — HEPARIN SOD (PORK) LOCK FLUSH 100 UNIT/ML IV SOLN: 250.0000 [IU] | INTRAVENOUS | Status: DC | PRN

## 2016-09-20 MED ORDER — SODIUM CHLORIDE 0.9% FLUSH
10.0000 mL | INTRAVENOUS | Status: AC | PRN
  Administered 2016-09-20: 10 mL

## 2016-09-20 MED ORDER — DIPHENHYDRAMINE HCL 50 MG/ML IJ SOLN
INTRAMUSCULAR | Status: AC
  Filled 2016-09-20: qty 1

## 2016-09-20 MED ORDER — ACETAMINOPHEN 325 MG PO TABS
650.0000 mg | ORAL_TABLET | Freq: Once | ORAL | Status: AC
  Administered 2016-09-20: 650 mg via ORAL
  Filled 2016-09-20: qty 2

## 2016-09-20 MED ORDER — SODIUM CHLORIDE 0.9% FLUSH
3.0000 mL | INTRAVENOUS | Status: DC | PRN
Start: 2016-09-20 — End: 2016-09-21

## 2016-09-20 MED ORDER — DIPHENHYDRAMINE HCL 25 MG PO TABS
25.0000 mg | ORAL_TABLET | ORAL | Status: DC
  Filled 2016-09-20: qty 1

## 2016-09-20 MED ORDER — METHYLPREDNISOLONE SODIUM SUCC 125 MG IJ SOLR
INTRAMUSCULAR | Status: AC
  Filled 2016-09-20: qty 2

## 2016-09-20 MED ORDER — DIPHENHYDRAMINE HCL 50 MG/ML IJ SOLN
25.0000 mg | Freq: Once | INTRAMUSCULAR | Status: AC
  Administered 2016-09-20: 25 mg via INTRAVENOUS

## 2016-09-20 MED ORDER — SODIUM CHLORIDE 0.9 % IV SOLN
250.0000 mL | Freq: Once | INTRAVENOUS | Status: AC
  Administered 2016-09-20: 250 mL via INTRAVENOUS

## 2016-09-20 MED ORDER — HEPARIN SOD (PORK) LOCK FLUSH 100 UNIT/ML IV SOLN
500.0000 [IU] | Freq: Every day | INTRAVENOUS | Status: AC | PRN
  Administered 2016-09-20: 500 [IU]
  Filled 2016-09-20: qty 5

## 2016-09-20 MED ORDER — DIPHENHYDRAMINE HCL 25 MG PO CAPS
25.0000 mg | ORAL_CAPSULE | Freq: Once | ORAL | Status: AC
  Administered 2016-09-20: 25 mg via ORAL
  Filled 2016-09-20: qty 1

## 2016-09-20 MED ORDER — METHYLPREDNISOLONE SODIUM SUCC 125 MG IJ SOLR
40.0000 mg | Freq: Once | INTRAMUSCULAR | Status: AC
  Administered 2016-09-20: 40 mg via INTRAVENOUS

## 2016-09-20 MED ORDER — DIPHENHYDRAMINE HCL 50 MG/ML IJ SOLN
25.0000 mg | Freq: Once | INTRAMUSCULAR | Status: DC
Start: 2016-09-20 — End: 2016-09-20
  Filled 2016-09-20: qty 1

## 2016-09-20 NOTE — Progress Notes (Signed)
Provider: Mayme Genta   Diagnosis: Anemia in neoplastic disease (D63.0)  Treatment: 2 units of PRBC's via port a cath  Patient had a possible transfusion reaction. Blood was stopped and MD notified. MD advised to medicate patient only no need for work up at this time. Patient vital signs remained stable and within normal limits. Discharge instructions given to patient and patient states an understanding. Patient alert, oriented, and ambulatory at time of discharge.

## 2016-09-20 NOTE — Progress Notes (Addendum)
Patient reports itching and flushing on face at this time. Blood transfusion stopped and patient's MD [facility] notified. Patient has about 50cc left of blood transfusion. Verbal order for IV Bendaryl and IV Solumedrol to be given to patient. Will continue to monitor patient.

## 2016-09-20 NOTE — Discharge Instructions (Signed)

## 2016-09-21 LAB — BPAM RBC
BLOOD PRODUCT EXPIRATION DATE: 201810032359
BLOOD PRODUCT EXPIRATION DATE: 201810032359
ISSUE DATE / TIME: 201809060917
ISSUE DATE / TIME: 201809060917
UNIT TYPE AND RH: 5100
Unit Type and Rh: 5100

## 2016-09-21 LAB — TYPE AND SCREEN
ABO/RH(D): O POS
ANTIBODY SCREEN: NEGATIVE
UNIT DIVISION: 0
UNIT DIVISION: 0

## 2016-09-23 ENCOUNTER — Encounter: Payer: Self-pay | Admitting: Oncology

## 2016-09-25 ENCOUNTER — Telehealth: Payer: Self-pay | Admitting: *Deleted

## 2016-09-25 ENCOUNTER — Ambulatory Visit: Payer: Medicare Other

## 2016-09-25 ENCOUNTER — Other Ambulatory Visit: Payer: Medicare Other

## 2016-09-25 LAB — CULTURE, BLOOD (SINGLE)

## 2016-09-25 NOTE — Telephone Encounter (Signed)
Call from pt, she doesn't feel well today and needs to cancel appts. Pt plans to come in as scheduled on 9/12. Message sent to schedulers to add infusion 9/13 and to add lab/flush to 9/12 appt. This message routed to collaborative RN for follow up.

## 2016-09-26 ENCOUNTER — Other Ambulatory Visit (HOSPITAL_BASED_OUTPATIENT_CLINIC_OR_DEPARTMENT_OTHER): Payer: Medicare Other

## 2016-09-26 ENCOUNTER — Other Ambulatory Visit: Payer: Self-pay | Admitting: Internal Medicine

## 2016-09-26 ENCOUNTER — Ambulatory Visit (HOSPITAL_BASED_OUTPATIENT_CLINIC_OR_DEPARTMENT_OTHER): Payer: Medicare Other

## 2016-09-26 VITALS — BP 157/65 | HR 68 | Temp 98.3°F | Resp 16

## 2016-09-26 DIAGNOSIS — C9000 Multiple myeloma not having achieved remission: Secondary | ICD-10-CM

## 2016-09-26 LAB — COMPREHENSIVE METABOLIC PANEL
ALT: 24 U/L (ref 0–55)
ANION GAP: 8 meq/L (ref 3–11)
AST: 20 U/L (ref 5–34)
Albumin: 3.4 g/dL — ABNORMAL LOW (ref 3.5–5.0)
Alkaline Phosphatase: 146 U/L (ref 40–150)
BUN: 36.4 mg/dL — AB (ref 7.0–26.0)
CHLORIDE: 114 meq/L — AB (ref 98–109)
CO2: 19 meq/L — AB (ref 22–29)
CREATININE: 2.3 mg/dL — AB (ref 0.6–1.1)
Calcium: 8.7 mg/dL (ref 8.4–10.4)
EGFR: 20 mL/min/{1.73_m2} — ABNORMAL LOW (ref >90)
GLUCOSE: 110 mg/dL (ref 70–140)
Potassium: 4 mEq/L (ref 3.5–5.1)
Sodium: 140 mEq/L (ref 136–145)
Total Bilirubin: 0.72 mg/dL (ref 0.20–1.20)
Total Protein: 5.3 g/dL — ABNORMAL LOW (ref 6.4–8.3)

## 2016-09-26 LAB — CBC WITH DIFFERENTIAL/PLATELET
BASO%: 0.2 % (ref 0.0–2.0)
Basophils Absolute: 0 10*3/uL (ref 0.0–0.1)
EOS ABS: 0 10*3/uL (ref 0.0–0.5)
EOS%: 0.5 % (ref 0.0–7.0)
HCT: 31.7 % — ABNORMAL LOW (ref 34.8–46.6)
HGB: 10.6 g/dL — ABNORMAL LOW (ref 11.6–15.9)
LYMPH%: 11.1 % — AB (ref 14.0–49.7)
MCH: 35.6 pg — ABNORMAL HIGH (ref 25.1–34.0)
MCHC: 33.5 g/dL (ref 31.5–36.0)
MCV: 106.5 fL — ABNORMAL HIGH (ref 79.5–101.0)
MONO#: 0.7 10*3/uL (ref 0.1–0.9)
MONO%: 13.8 % (ref 0.0–14.0)
NEUT#: 3.7 10*3/uL (ref 1.5–6.5)
NEUT%: 74.4 % (ref 38.4–76.8)
PLATELETS: 56 10*3/uL — AB (ref 145–400)
RBC: 2.98 10*6/uL — AB (ref 3.70–5.45)
RDW: 23 % — ABNORMAL HIGH (ref 11.2–14.5)
WBC: 4.9 10*3/uL (ref 3.9–10.3)
lymph#: 0.5 10*3/uL — ABNORMAL LOW (ref 0.9–3.3)

## 2016-09-26 MED ORDER — HEPARIN SOD (PORK) LOCK FLUSH 100 UNIT/ML IV SOLN
500.0000 [IU] | INTRAVENOUS | Status: AC | PRN
  Administered 2016-09-26: 500 [IU]
  Filled 2016-09-26: qty 5

## 2016-09-26 MED ORDER — SODIUM CHLORIDE 0.9% FLUSH
10.0000 mL | INTRAVENOUS | Status: AC | PRN
  Administered 2016-09-26: 10 mL
  Filled 2016-09-26: qty 10

## 2016-09-26 NOTE — Progress Notes (Signed)
Hold treatment today for Platelet count of 56 per Dr Julien Nordmann and Patient to come next week for labs and possible treatment.

## 2016-10-02 ENCOUNTER — Telehealth: Payer: Self-pay

## 2016-10-02 ENCOUNTER — Other Ambulatory Visit: Payer: Medicare Other

## 2016-10-02 ENCOUNTER — Telehealth: Payer: Self-pay | Admitting: Medical Oncology

## 2016-10-02 ENCOUNTER — Ambulatory Visit: Payer: Medicare Other

## 2016-10-02 NOTE — Telephone Encounter (Signed)
Called pt because she is typically on time for her appointments. Pt said that she called Dr. Worthy Flank nurse on Friday to tell them she was scheduled inappropriately, and this should have been her week off. Appt cancelled for today. Pt out of town.

## 2016-10-02 NOTE — Telephone Encounter (Signed)
I left message that pt scheduled this week ( today and tomorrow )since she missed last week. I called to clarify when she will be going out of town.

## 2016-10-02 NOTE — Telephone Encounter (Signed)
Ok to start with the new cycle on 10/2

## 2016-10-03 ENCOUNTER — Ambulatory Visit: Payer: Medicare Other

## 2016-10-03 NOTE — Telephone Encounter (Signed)
Spoke with pt who verified she is out of town  Week of 9/17. Pt will return for lab/CT 9/24. Instructed pt to keep all future appts. Pt verbalized and confirmed understanding.

## 2016-10-05 DIAGNOSIS — Z23 Encounter for immunization: Secondary | ICD-10-CM | POA: Diagnosis not present

## 2016-10-05 DIAGNOSIS — R911 Solitary pulmonary nodule: Secondary | ICD-10-CM | POA: Diagnosis not present

## 2016-10-05 DIAGNOSIS — I951 Orthostatic hypotension: Secondary | ICD-10-CM | POA: Diagnosis not present

## 2016-10-05 DIAGNOSIS — N2581 Secondary hyperparathyroidism of renal origin: Secondary | ICD-10-CM | POA: Diagnosis not present

## 2016-10-05 DIAGNOSIS — R079 Chest pain, unspecified: Secondary | ICD-10-CM | POA: Diagnosis not present

## 2016-10-05 DIAGNOSIS — I770 Arteriovenous fistula, acquired: Secondary | ICD-10-CM | POA: Diagnosis not present

## 2016-10-05 DIAGNOSIS — R809 Proteinuria, unspecified: Secondary | ICD-10-CM | POA: Diagnosis not present

## 2016-10-05 DIAGNOSIS — D631 Anemia in chronic kidney disease: Secondary | ICD-10-CM | POA: Diagnosis not present

## 2016-10-05 DIAGNOSIS — D630 Anemia in neoplastic disease: Secondary | ICD-10-CM | POA: Diagnosis not present

## 2016-10-05 DIAGNOSIS — I5030 Unspecified diastolic (congestive) heart failure: Secondary | ICD-10-CM | POA: Diagnosis not present

## 2016-10-05 DIAGNOSIS — C9000 Multiple myeloma not having achieved remission: Secondary | ICD-10-CM | POA: Diagnosis not present

## 2016-10-05 DIAGNOSIS — N184 Chronic kidney disease, stage 4 (severe): Secondary | ICD-10-CM | POA: Diagnosis not present

## 2016-10-08 ENCOUNTER — Other Ambulatory Visit (HOSPITAL_BASED_OUTPATIENT_CLINIC_OR_DEPARTMENT_OTHER): Payer: Medicare Other

## 2016-10-08 ENCOUNTER — Other Ambulatory Visit: Payer: Medicare Other

## 2016-10-08 ENCOUNTER — Other Ambulatory Visit: Payer: Self-pay | Admitting: Physician Assistant

## 2016-10-08 ENCOUNTER — Ambulatory Visit
Admission: RE | Admit: 2016-10-08 | Discharge: 2016-10-08 | Disposition: A | Discharge status: Home / Self Care | Payer: Medicare Other | Source: Ambulatory Visit | Attending: Internal Medicine | Admitting: Internal Medicine

## 2016-10-08 DIAGNOSIS — C9000 Multiple myeloma not having achieved remission: Secondary | ICD-10-CM

## 2016-10-08 DIAGNOSIS — J189 Pneumonia, unspecified organism: Secondary | ICD-10-CM | POA: Diagnosis not present

## 2016-10-08 DIAGNOSIS — B450 Pulmonary cryptococcosis: Secondary | ICD-10-CM

## 2016-10-08 DIAGNOSIS — N185 Chronic kidney disease, stage 5: Secondary | ICD-10-CM

## 2016-10-08 DIAGNOSIS — Z5111 Encounter for antineoplastic chemotherapy: Secondary | ICD-10-CM | POA: Diagnosis not present

## 2016-10-08 DIAGNOSIS — J439 Emphysema, unspecified: Secondary | ICD-10-CM

## 2016-10-08 LAB — COMPREHENSIVE METABOLIC PANEL
ALBUMIN: 3.7 g/dL (ref 3.5–5.0)
ALK PHOS: 102 U/L (ref 40–150)
ALT: 26 U/L (ref 0–55)
AST: 18 U/L (ref 5–34)
Anion Gap: 11 mEq/L (ref 3–11)
BUN: 52.5 mg/dL — AB (ref 7.0–26.0)
CALCIUM: 9.5 mg/dL (ref 8.4–10.4)
CHLORIDE: 106 meq/L (ref 98–109)
CO2: 26 mEq/L (ref 22–29)
CREATININE: 2.6 mg/dL — AB (ref 0.6–1.1)
EGFR: 17 mL/min/{1.73_m2} — ABNORMAL LOW (ref >90)
GLUCOSE: 88 mg/dL (ref 70–140)
Potassium: 3.7 mEq/L (ref 3.5–5.1)
Sodium: 143 mEq/L (ref 136–145)
Total Bilirubin: 1.11 mg/dL (ref 0.20–1.20)
Total Protein: 5.8 g/dL — ABNORMAL LOW (ref 6.4–8.3)

## 2016-10-08 LAB — CBC WITH DIFFERENTIAL/PLATELET
BASO%: 0.5 % (ref 0.0–2.0)
Basophils Absolute: 0 10*3/uL (ref 0.0–0.1)
EOS%: 1.1 % (ref 0.0–7.0)
Eosinophils Absolute: 0.1 10*3/uL (ref 0.0–0.5)
HEMATOCRIT: 33.3 % — AB (ref 34.8–46.6)
HEMOGLOBIN: 10.8 g/dL — AB (ref 11.6–15.9)
LYMPH%: 25.8 % (ref 14.0–49.7)
MCH: 35.9 pg — ABNORMAL HIGH (ref 25.1–34.0)
MCHC: 32.4 g/dL (ref 31.5–36.0)
MCV: 110.8 fL — ABNORMAL HIGH (ref 79.5–101.0)
MONO#: 0.6 10*3/uL (ref 0.1–0.9)
MONO%: 11.9 % (ref 0.0–14.0)
NEUT%: 60.7 % (ref 38.4–76.8)
NEUTROS ABS: 3 10*3/uL (ref 1.5–6.5)
PLATELETS: 82 10*3/uL — AB (ref 145–400)
RBC: 3.01 10*6/uL — ABNORMAL LOW (ref 3.70–5.45)
RDW: 21.1 % — AB (ref 11.2–14.5)
WBC: 4.9 10*3/uL (ref 3.9–10.3)
lymph#: 1.3 10*3/uL (ref 0.9–3.3)

## 2016-10-08 LAB — LACTATE DEHYDROGENASE: LDH: 218 U/L (ref 125–245)

## 2016-10-09 ENCOUNTER — Other Ambulatory Visit: Payer: Medicare Other

## 2016-10-09 LAB — IGG, IGA, IGM
IGG (IMMUNOGLOBIN G), SERUM: 217 mg/dL — AB (ref 700–1600)
IgA, Qn, Serum: 10 mg/dL — ABNORMAL LOW (ref 64–422)
IgM, Qn, Serum: <5 mg/dL — ABNORMAL LOW (ref 26–217)

## 2016-10-09 LAB — KAPPA/LAMBDA LIGHT CHAINS
IG KAPPA FREE LIGHT CHAIN: 12.2 mg/L (ref 3.3–19.4)
Ig Lambda Free Light Chain: 2.9 mg/L — ABNORMAL LOW (ref 5.7–26.3)
Kappa/Lambda FluidC Ratio: 4.21 — ABNORMAL HIGH (ref 0.26–1.65)

## 2016-10-09 LAB — BETA 2 MICROGLOBULIN, SERUM: BETA 2: 4.8 mg/L — AB (ref 0.6–2.4)

## 2016-10-10 ENCOUNTER — Other Ambulatory Visit: Payer: Self-pay | Admitting: Medical Oncology

## 2016-10-10 DIAGNOSIS — C9000 Multiple myeloma not having achieved remission: Secondary | ICD-10-CM

## 2016-10-10 MED ORDER — DEXAMETHASONE 4 MG PO TABS: 4.0000 mg | ORAL_TABLET | Freq: Every day | ORAL | 1 refills | Status: DC

## 2016-10-16 ENCOUNTER — Ambulatory Visit (HOSPITAL_BASED_OUTPATIENT_CLINIC_OR_DEPARTMENT_OTHER): Payer: Medicare Other

## 2016-10-16 ENCOUNTER — Telehealth: Payer: Self-pay | Admitting: Internal Medicine

## 2016-10-16 ENCOUNTER — Encounter: Payer: Self-pay | Admitting: Internal Medicine

## 2016-10-16 ENCOUNTER — Other Ambulatory Visit (HOSPITAL_BASED_OUTPATIENT_CLINIC_OR_DEPARTMENT_OTHER): Payer: Medicare Other

## 2016-10-16 ENCOUNTER — Ambulatory Visit (HOSPITAL_BASED_OUTPATIENT_CLINIC_OR_DEPARTMENT_OTHER): Payer: Medicare Other | Admitting: Internal Medicine

## 2016-10-16 ENCOUNTER — Ambulatory Visit: Payer: Medicare Other

## 2016-10-16 VITALS — BP 136/56 | HR 78 | Temp 98.2°F | Resp 18 | Ht 65.5 in | Wt 172.3 lb

## 2016-10-16 DIAGNOSIS — R530 Neoplastic (malignant) related fatigue: Secondary | ICD-10-CM | POA: Diagnosis not present

## 2016-10-16 DIAGNOSIS — N289 Disorder of kidney and ureter, unspecified: Secondary | ICD-10-CM

## 2016-10-16 DIAGNOSIS — C9001 Multiple myeloma in remission: Secondary | ICD-10-CM

## 2016-10-16 DIAGNOSIS — C9002 Multiple myeloma in relapse: Secondary | ICD-10-CM

## 2016-10-16 DIAGNOSIS — G620 Drug-induced polyneuropathy: Secondary | ICD-10-CM | POA: Diagnosis not present

## 2016-10-16 DIAGNOSIS — Z853 Personal history of malignant neoplasm of breast: Secondary | ICD-10-CM

## 2016-10-16 DIAGNOSIS — Z5111 Encounter for antineoplastic chemotherapy: Secondary | ICD-10-CM

## 2016-10-16 DIAGNOSIS — D630 Anemia in neoplastic disease: Secondary | ICD-10-CM

## 2016-10-16 DIAGNOSIS — C9000 Multiple myeloma not having achieved remission: Secondary | ICD-10-CM

## 2016-10-16 DIAGNOSIS — G4700 Insomnia, unspecified: Secondary | ICD-10-CM | POA: Diagnosis not present

## 2016-10-16 DIAGNOSIS — Z95828 Presence of other vascular implants and grafts: Secondary | ICD-10-CM

## 2016-10-16 DIAGNOSIS — D696 Thrombocytopenia, unspecified: Secondary | ICD-10-CM | POA: Diagnosis not present

## 2016-10-16 LAB — CBC WITH DIFFERENTIAL/PLATELET
BASO%: 0.6 % (ref 0.0–2.0)
Basophils Absolute: 0 10*3/uL (ref 0.0–0.1)
EOS%: 3.7 % (ref 0.0–7.0)
Eosinophils Absolute: 0.2 10*3/uL (ref 0.0–0.5)
HCT: 33 % — ABNORMAL LOW (ref 34.8–46.6)
HEMOGLOBIN: 11 g/dL — AB (ref 11.6–15.9)
LYMPH%: 24.2 % (ref 14.0–49.7)
MCH: 36.2 pg — ABNORMAL HIGH (ref 25.1–34.0)
MCHC: 33.3 g/dL (ref 31.5–36.0)
MCV: 108.7 fL — AB (ref 79.5–101.0)
MONO#: 0.6 10*3/uL (ref 0.1–0.9)
MONO%: 15.1 % — AB (ref 0.0–14.0)
NEUT%: 56.4 % (ref 38.4–76.8)
NEUTROS ABS: 2.3 10*3/uL (ref 1.5–6.5)
PLATELETS: 76 10*3/uL — AB (ref 145–400)
RBC: 3.04 10*6/uL — AB (ref 3.70–5.45)
RDW: 20.5 % — AB (ref 11.2–14.5)
WBC: 4.1 10*3/uL (ref 3.9–10.3)
lymph#: 1 10*3/uL (ref 0.9–3.3)

## 2016-10-16 LAB — COMPREHENSIVE METABOLIC PANEL
ALT: 20 U/L (ref 0–55)
ANION GAP: 8 meq/L (ref 3–11)
AST: 18 U/L (ref 5–34)
Albumin: 3.6 g/dL (ref 3.5–5.0)
Alkaline Phosphatase: 99 U/L (ref 40–150)
BILIRUBIN TOTAL: 0.72 mg/dL (ref 0.20–1.20)
BUN: 30 mg/dL — ABNORMAL HIGH (ref 7.0–26.0)
CHLORIDE: 112 meq/L — AB (ref 98–109)
CO2: 22 meq/L (ref 22–29)
Calcium: 8.8 mg/dL (ref 8.4–10.4)
Creatinine: 2.6 mg/dL — ABNORMAL HIGH (ref 0.6–1.1)
EGFR: 17 mL/min/{1.73_m2} — AB (ref >90)
Glucose: 84 mg/dl (ref 70–140)
Potassium: 3.7 mEq/L (ref 3.5–5.1)
Sodium: 142 mEq/L (ref 136–145)
Total Protein: 5.9 g/dL — ABNORMAL LOW (ref 6.4–8.3)

## 2016-10-16 MED ORDER — SODIUM CHLORIDE 0.9% FLUSH
10.0000 mL | Freq: Once | INTRAVENOUS | Status: AC
  Administered 2016-10-16: 10 mL
  Filled 2016-10-16: qty 10

## 2016-10-16 MED ORDER — HEPARIN SOD (PORK) LOCK FLUSH 100 UNIT/ML IV SOLN
500.0000 [IU] | Freq: Once | INTRAVENOUS | Status: AC | PRN
  Administered 2016-10-16: 500 [IU]
  Filled 2016-10-16: qty 5

## 2016-10-16 NOTE — Progress Notes (Signed)
[place] [phone]   [phone]  OFFICE PROGRESS NOTE  [name], [address] [place]  [address]  DIAGNOSIS:  1. 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 Recurrent multiple myeloma initially diagnosed as a smoldering myeloma at [facility] in 2002. 3. Ductal carcinoma in situ status post mastectomy with sentinel lymph node biopsy in October 2008.  PRIOR THERAPY: 1. Status post treatment with tamoxifen from November 2008 through February 2009, discontinued secondary to intolerance. 2. Status post 3 cycles of chemotherapy with Revlimid and Decadron followed by 1 cycle of Decadron only with mild response. 3. Status post 2 cycles of systemic chemotherapy with Velcade, Doxil and Decadron discontinued secondary to significant peripheral neuropathy. Last dose was given May 2010 at [facility]. 4. Status post autologous peripheral blood stem cell transplant on October 01, 2008 at [facility] under the care of Dr. Phyllis Ginger.  5. Systemic chemotherapy with Carfilzomib initially was 20 mg/M2 and will be increased after cycle #1 to 36 mg/M2 on days 1, 2, 8, 9, 15 and 16 every 4 weeks in addition to Cytoxan 300 mg/M2 and Decadron 40 mg by mouth weekly basis, status post 4 cycles. First cycle on 12/29/2012. 6. She resumed chemotherapy with Carfilzomib, Cytoxan, and Decadron on 05/03/2014. Status post 6 cycles. 7. systemic chemotherapy again with Carfilzomib, Cytoxan and Decadron on 07/11/2015. Status post 3 cycles. 8. She completed a course of treatment with Diflucan 200 mg by mouth daily for around 6 weeks for cryptococcal infection of the lung.  CURRENT THERAPY: Resuming treatment again with Carfilzomib, Cytoxan and Decadron, first cycle 04/23/2016. Status post 7 cycles  INTERVAL HISTORY: Nancy Norris 76 y.o. female returns to the clinic today for follow-up visit. The  patient is feeling fine with no specific complaints except for fatigue and insomnia in the last 2 days.she denied having any current chest pain, shortness of breath, cough or hemoptysis. She denied having any fever or chills. She has no nausea, vomiting, diarrhea or constipation. She missed several doses of her treatment last month.the patient denied having any significant weight loss or night sweats. She had repeat myeloma panel performed recently and she is here for evaluation and discussion of her lab results and treatment options.  MEDICAL HISTORY: Past Medical History:  Diagnosis Date  . Anemia   . Anginal pain ([place])    used NTG x 2 May 31 and 06/15/13   . Anxiety   . Arthritis   . B12 deficiency 12/04/2014  . Breast cancer ([place])   . CKD (chronic kidney disease) stage 3, GFR 30-59 ml/min   . Complication of anesthesia   . COPD (chronic obstructive pulmonary disease) ([place])   . Cryptococcal pneumonitis ([place]) 11/22/2015  . Depression   . Dizziness   . Dyspnea   . Fibromyalgia   . Fibromyalgia   . GERD (gastroesophageal reflux disease)   . Headache(784.0)   . Heart murmur   . Hemoptysis 10/21/2015  . History of blood transfusion    last one May 12   . Hx of cardiovascular stress test    LexiScan with low level exercise Myoview (02/2013): No ischemia, EF 72%; normal study  . Hx of echocardiogram    a.  Echocardiogram (12/26/2012): EF 02-77%, grade 1 diastolic dysfunction;   b.  Echocardiogram (02/2013): EF 55-60%, no WMA, trivial effusion  . Hyperkalemia   . Hyponatremia   . Hypothyroidism   .  Mucositis   . Multiple myeloma   . Myocardial infarction [facility])    in past, patient was unaware.   . Neuropathy   . Pain in joint, pelvic region and thigh 07/07/2015  . PONV (postoperative nausea and vomiting) 2008   after mastestomy    ALLERGIES:  is allergic to codeine; latex; other; onion; zyprexa [olanzapine]; adhesive [tape]; hydrocodone; iodinated diagnostic agents; and sulfa  antibiotics.  MEDICATIONS:  Current Outpatient Prescriptions  Medication Sig Dispense Refill  . acetaminophen (TYLENOL) 325 MG tablet Take 650 mg by mouth every 6 (six) hours as needed for mild pain or moderate pain.     Marland Kitchen albuterol (PROVENTIL HFA;VENTOLIN HFA) 108 (90 Base) MCG/ACT inhaler Inhale 1-2 puffs into the lungs every 6 (six) hours as needed for wheezing or shortness of breath (cough). 1 Inhaler 3  . amoxicillin-clavulanate (AUGMENTIN) 500-125 MG tablet Take 1 tablet (500 mg total) by mouth 2 (two) times daily. (renal dosing. Cr cl 22) 20 tablet 0  . amphetamine-dextroamphetamine (ADDERALL) 20 MG tablet Take 1/2 to 1 tablet 1 or 2 x / day for focusing/concentration & Depression 60 tablet 0  . azelastine (ASTELIN) 0.1 % nasal spray Place 2 sprays into both nostrils 2 (two) times daily. Use in each nostril as directed 30 mL 2  . buPROPion (WELLBUTRIN XL) 300 MG 24 hr tablet TAKE 1 TABLET BY MOUTH EACH MORNING FOR MOOD AND DEPRESSION. 90 tablet 1  . butalbital-acetaminophen-caffeine (FIORICET, ESGIC) 50-325-40 MG tablet TAKE 1 OR 2 TABLETS BY MOUTH EVERY 4 HOURS AS NEEDED FOR PAIN OR HEADACHE. 50 tablet 3  . Cholecalciferol 4000 UNITS TABS Take 4,000 Units by mouth daily.    . citalopram (CELEXA) 40 MG tablet TAKE 1 TABLET BY MOUTH DAILY OR AS DIRECTED 90 tablet 1  . cromolyn (OPTICROM) 4 % ophthalmic solution Place 1 drop into both eyes 4 (four) times daily.    Marland Kitchen dexamethasone (DECADRON) 4 MG tablet Take 1 tablet (4 mg total) by mouth daily. Take 10 tablets by mouth once a week as directed. 30 tablet 1  . ferrous sulfate 325 (65 FE) MG tablet Take 1 tablet daily or as directed 90 tablet 1  . fluconazole (DIFLUCAN) 200 MG tablet Take 1 tablet (200 mg total) by mouth daily. 1 tablet 0  . gabapentin (NEURONTIN) 300 MG capsule TAKE 1 CAPSULE BY MOUTH AT BEDTIME. 90 capsule 3  . ipratropium (ATROVENT) 0.06 % nasal spray Place 2 sprays into both nostrils 4 (four) times daily. 15 mL 0  .  isosorbide mononitrate (IMDUR) 30 MG 24 hr tablet Take 1 tablet (30 mg total) by mouth daily. 90 tablet 3  . levothyroxine (SYNTHROID, LEVOTHROID) 150 MCG tablet Take 1 tablet (150 mcg total) by mouth daily. (Patient taking differently: Take 150 mcg by mouth daily. ) 90 tablet 1  . loperamide (IMODIUM) 2 MG capsule Take 1 capsule (2 mg total) by mouth as needed for diarrhea or loose stools. 30 capsule 2  . loteprednol (ALREX) 0.2 % SUSP Place 1 drop into both eyes 4 (four) times daily. 1 Bottle PRN  . magic mouthwash SOLN Take 5 mLs by mouth 4 (four) times daily as needed for mouth pain. 240 mL 1  . meloxicam (MOBIC) 15 MG tablet Take 1 tablet (15 mg total) by mouth daily. 90 tablet 3  . midodrine (PROAMATINE) 10 MG tablet TAKE 1 TABLET BY MOUTH 3 TIMES DAILY. 270 tablet 2  . montelukast (SINGULAIR) 10 MG tablet TAKE 1 TABLET BY MOUTH DAILY.  90 tablet 1  . nitroGLYCERIN (NITROSTAT) 0.4 MG SL tablet Place 0.4 mg under the tongue every 5 (five) minutes as needed for chest pain. Reported on 03/30/2015    . nystatin (MYCOSTATIN/NYSTOP) powder Apply topically 2 (two) times daily. 30 g 0  . pilocarpine (SALAGEN) 5 MG tablet TAKE 1 TABLET BY MOUTH 3 TIMES DAILY FOR DRY MOUTH. 270 tablet 1  . PRESCRIPTION MEDICATION Pt gets chemo at [facility].    . ranitidine (ZANTAC) 300 MG tablet Take 1 to 2 tablets daily for heartburn & reflux 180 tablet 1  . senna (SENOKOT) 8.6 MG tablet Take 1 tablet by mouth at bedtime.    . traMADol (ULTRAM) 50 MG tablet Take 1 to 2 tablets by mouth 4 times a day as needed for pain. 180 tablet 0  . vitamin C (ASCORBIC ACID) 250 MG tablet Take 250 mg by mouth daily.     Current Facility-Administered Medications  Medication Dose Route Frequency Provider Last Rate Last Dose  . cefTRIAXone (ROCEPHIN) 1 g in dextrose 5 % 50 mL IVPB  1 g Intravenous Q24H Mikey Bussing R, NP   1 g at 09/19/16 1019    SURGICAL HISTORY:  Past Surgical History:  Procedure Laterality Date  . ABDOMINAL  HYSTERECTOMY  1981  . AV FISTULA PLACEMENT Left 06/19/2013   Procedure: CREATION OF LEFT ARM ARTERIOVENOUS (AV) FISTULA ;  Surgeon: Angelia Mould, MD;  Location: [facility];  Service: Vascular;  Laterality: Left;  . BREAST RECONSTRUCTION    . BREAST SURGERY    . CATARACT EXTRACTION, BILATERAL    . CHOLECYSTECTOMY  1971  . EYE SURGERY Bilateral    lens implant  . history of Port removal    . LUNG BIOPSY  10/21/2015  . MASTECTOMY Left 2008  . PORTACATH PLACEMENT  12/2012   has had 2  . Status post stem cell transplant on September 28, 2008.      REVIEW OF SYSTEMS:  Constitutional: positive for fatigue Eyes: negative Ears, nose, mouth, throat, and face: negative Respiratory: negative Cardiovascular: negative Gastrointestinal: negative Genitourinary:negative Integument/breast: negative Hematologic/lymphatic: negative Musculoskeletal:negative Neurological: negative Behavioral/Psych: negative Endocrine: negative Allergic/Immunologic: negative   PHYSICAL EXAMINATION: General appearance: alert, cooperative, fatigued and no distress Head: Normocephalic, without obvious abnormality, atraumatic Neck: no adenopathy, no JVD, supple, symmetrical, trachea midline and thyroid not enlarged, symmetric, no tenderness/mass/nodules Lymph nodes: Cervical, supraclavicular, and axillary nodes normal. Resp: clear to auscultation bilaterally Back: symmetric, no curvature. ROM normal. No CVA tenderness. Cardio: regular rate and rhythm, S1, S2 normal, no murmur, click, rub or gallop GI: soft, non-tender; bowel sounds normal; no masses,  no organomegaly Extremities: extremities normal, atraumatic, no cyanosis or edema Neurologic: Alert and oriented X 3, normal strength and tone. Normal symmetric reflexes. Normal coordination and gait  ECOG PERFORMANCE STATUS: 1 - Symptomatic but completely ambulatory  Blood pressure (!) 136/56, pulse 78, temperature 98.2 F (36.8 C), temperature source Oral, resp.  rate 18, height 5' 5.5" (1.664 m), weight 172 lb 4.8 oz (78.2 kg), SpO2 99 %.  LABORATORY DATA: Lab Results  Component Value Date   WBC 4.1 10/16/2016   HGB 11.0 (L) 10/16/2016   HCT 33.0 (L) 10/16/2016   MCV 108.7 (H) 10/16/2016   PLT 76 (L) 10/16/2016      Chemistry      Component Value Date/Time   NA 142 10/16/2016 1041   K 3.7 10/16/2016 1041   CL 106 02/20/2016 1213   CL 105 03/19/2012 0811   CO2 22  10/16/2016 1041   BUN 30.0 (H) 10/16/2016 1041   CREATININE 2.6 (H) 10/16/2016 1041      Component Value Date/Time   CALCIUM 8.8 10/16/2016 1041   ALKPHOS 99 10/16/2016 1041   AST 18 10/16/2016 1041   ALT 20 10/16/2016 1041   BILITOT 0.72 10/16/2016 1041       RADIOGRAPHIC STUDIES: Dg Chest 2 View  Result Date: 09/19/2016 CLINICAL DATA:  One week of cough and shortness of breath with chest discomfort. Fever to 102.9 degrees. Currently on chemotherapy EXAM: CHEST  2 VIEW COMPARISON:  PA and lateral chest x-ray of September 14, 2016 FINDINGS: The lungs are adequately inflated. There is hazy increased density that projects in the left mid and lower lung. This is not entirely new. There is no pleural effusion or pneumothorax. The heart is top-normal in size. The pulmonary vascularity is mildly cephalized. There is calcification in the wall of the thoracic aorta. The power port catheter tip projects over the distal third of the SVC. The bony structures are unremarkable. IMPRESSION: Findings suggestive of subsegmental atelectasis or early pneumonia in the left lower lobe. Minimal pulmonary vascular congestion may be due to cardiac or noncardiac causes. Electronically Signed   By: David  Martinique M.D.   On: 09/19/2016 10:08   Ct Chest Wo Contrast  Result Date: 10/08/2016 CLINICAL DATA:  76 year old with current history of multiple myeloma and biopsy-proven cryptococcal pneumonia. Patient currently has shortness of breath and a nonproductive cough. Current history of stage 4 chronic kidney  disease. Personal history of left breast cancer post mastectomy in 2010. EXAM: CT CHEST WITHOUT CONTRAST TECHNIQUE: Multidetector CT imaging of the chest was performed following the standard protocol without IV contrast. COMPARISON:  CT chest 04/17/2016, 01/10/2016, 09/26/2015 and PET-CT 10/11/2015. FINDINGS: Cardiovascular: Heart mildly enlarged. Severe LAD and right coronary artery atherosclerosis. No pericardial effusion. Mild to moderate atherosclerosis involving the thoracic and visualized upper abdominal aorta without evidence of aneurysm. Mediastinum/Nodes: Scattered normal sized mediastinal and hilar lymph nodes. No pathologic lymphadenopathy. Prior left axillary node dissection. Esophagus normal appearance. Thyroid gland atrophic. Lungs/Pleura: Emphysematous changes throughout both lungs as previously demonstrated. Residual mass associated with scarring/architectural distortion in the lingula measuring approximately 1.4 x 2.1 cm (series 5, image 74), unchanged to minimally decreased in size since the most recent CT in April. Stable scarring in the right middle lobe and deep in both lower lobes. No new pulmonary parenchymal abnormalities. No pleural effusions. Upper Abdomen: Small hiatal hernia, unchanged. Mild irregularity of the hepatic contour, unchanged. Stable mixed attenuation left adrenal mass measuring approximately 2.1 x 1.9 cm which was not hypermetabolic on the prior PET-CT. Remaining visualized upper abdomen unremarkable for the unenhanced technique. Musculoskeletal: Stable mild compression fracture of T6. Degenerative disc disease and spondylosis at C7-T1 and mild degenerative disc disease and spondylosis involving the mid and lower thoracic spine, unchanged. No new/ acute osseous abnormality. IMPRESSION: 1. Stable to slight decrease in size of the residual mass in the lingula at the site of the treated cryptococcal pneumonia, measurements given above. The mass is associated with  scar/architectural distortion related to treatment. 2. No new/acute pulmonary disease. 3. Stable mild cardiomegaly. LAD and right coronary artery atherosclerosis. 4. COPD/emphysema. 5. Stable small hiatal hernia. 6. Stable left adrenal adenoma. 7. Stable mild irregularity of the hepatic contour. Query early hepatic cirrhosis. Aortic Atherosclerosis (ICD10-I70.0) and Emphysema (ICD10-J43.9). Electronically Signed   By: Evangeline Dakin M.D.   On: 10/08/2016 09:56    ASSESSMENT AND PLAN:  This is  a very pleasant 76 years old white female with relapsed multiple myeloma status post several chemotherapy regimens and she is currently on treatment with systemic chemotherapy with Carfilzomib, Cytoxan and Decadron status post 7 cycles. She has been tolerating this treatment well except for fatigue. The patient had a recent myeloma panel. It did not show any clear evidence for disease progression. I discussed the lab result with the patient. I gave her the option of continuing her current treatment with Carfilzomib, Cytoxan and Decadron versus taking a break off the treatment because of her increasing fatigue and weakness. She also missed several doses last month. The patient is interested in taking a break. I will see her back for follow-up visit in 3 months for evaluation after repeating myeloma panel. For the thrombocytopenia, I will continue to monitor her platelets count closely. For the chronic renal insufficiency, she is followed by nephrology. For the peripheral neuropathy, she will continue her current treatment with Lyrica. She was advised to call immediately if she has any concerning symptoms in the interval. The patient voices understanding of current disease status and treatment options and is in agreement with the current care plan. All questions were answered. The patient knows to call the clinic with any problems, questions or concerns. We can certainly see the patient much sooner if  necessary.  Disclaimer: This note was dictated with voice recognition software. Similar sounding words can inadvertently be transcribed and may not be corrected upon review.

## 2016-10-16 NOTE — Telephone Encounter (Signed)
Spoke with patient re new appointments for January 2019. All other appointments cancelled per 10/2 los. Patient has port and declined to scheduled any flush appointments stating she doesn't have to do that - she has gone three months before without having it flushed. Left message informing desk nurse.

## 2016-10-17 ENCOUNTER — Ambulatory Visit: Payer: Medicare Other

## 2016-10-18 ENCOUNTER — Encounter: Payer: Self-pay | Admitting: Internal Medicine

## 2016-10-18 ENCOUNTER — Ambulatory Visit (INDEPENDENT_AMBULATORY_CARE_PROVIDER_SITE_OTHER): Payer: Medicare Other | Admitting: Internal Medicine

## 2016-10-18 VITALS — BP 126/71 | HR 73 | Temp 98.2°F | Ht 65.5 in | Wt 170.0 lb

## 2016-10-18 DIAGNOSIS — R911 Solitary pulmonary nodule: Secondary | ICD-10-CM | POA: Diagnosis not present

## 2016-10-18 DIAGNOSIS — B450 Pulmonary cryptococcosis: Secondary | ICD-10-CM

## 2016-10-18 DIAGNOSIS — Z5181 Encounter for therapeutic drug level monitoring: Secondary | ICD-10-CM

## 2016-10-18 NOTE — Assessment & Plan Note (Signed)
Stable nodules and I most suspect this is residual scarring.  I will recheck the Cryptococcal Ag to see if it is stable and at this time have her stop the fluconazole and observe off of treatment.  rtc 6 months.

## 2016-10-18 NOTE — Assessment & Plan Note (Signed)
As above, I think remaining nodule more scarring/residual findings and no further treatment or work up indicated at this time.

## 2016-10-18 NOTE — Assessment & Plan Note (Signed)
I will check her LFTs, creat to be sure ok on fluconazole and stop regardless.

## 2016-10-18 NOTE — Progress Notes (Signed)
   Subjective:    Patient ID: Nancy Norris, female    DOB: June 15, 1940, 76 y.o.   MRN: [id number]  HPI Here for follow up of cryptococcal pneumonia.  I saw her as a new patient on 12/12/15 and she was already on 200 mg fluconazole (renally dosed) for over 1 month.  She was getting chemotherapy for multiple myeloma and currently in a 3 month break until possibly January 2019.  She had a repeat CT scan of her chest and the area of concern has decreased from 3.8x3.0 to 2.1x1.7 and again prior to this visit was similar to her last scan in April 2018 at 1.4 x 2.1 cm. She has been on fluconazole 200 mg (renally dosed) for about 1 year.  She plans to continue on chemotherapy in place of considering dialysis depending on renal function and Myeloma panel results.  She does not plan to ever consent to dialysis.  She is having no associated rashes, diarrhea.   Recently diagnosed with pneumonia, now feeling fine except for seasonal allergies.     Review of Systems  Constitutional: Negative for chills and fever.  Respiratory: Negative for cough and shortness of breath.   Gastrointestinal: Negative for diarrhea.  Skin: Negative for rash.       Objective:   Physical Exam  Constitutional: She appears well-developed and well-nourished.  Eyes: No scleral icterus.  Cardiovascular: Normal rate, regular rhythm and normal heart sounds.   No murmur heard. Pulmonary/Chest: Effort normal and breath sounds normal. No respiratory distress. She has no wheezes.  Skin: No rash noted.    SH: no tobacco      Assessment & Plan:

## 2016-10-19 ENCOUNTER — Telehealth: Payer: Self-pay | Admitting: Infectious Diseases

## 2016-10-19 LAB — COMPREHENSIVE METABOLIC PANEL
AG Ratio: 2.3 (calc) (ref 1.0–2.5)
ALKALINE PHOSPHATASE (APISO): 103 U/L (ref 33–130)
ALT: 18 U/L (ref 6–29)
AST: 17 U/L (ref 10–35)
Albumin: 3.9 g/dL (ref 3.6–5.1)
BUN/Creatinine Ratio: 11 (calc) (ref 6–22)
BUN: 28 mg/dL — AB (ref 7–25)
CO2: 26 mmol/L (ref 20–32)
Calcium: 8.7 mg/dL (ref 8.6–10.4)
Chloride: 108 mmol/L (ref 98–110)
Creat: 2.45 mg/dL — ABNORMAL HIGH (ref 0.60–0.93)
Globulin: 1.7 g/dL (calc) — ABNORMAL LOW (ref 1.9–3.7)
Glucose, Bld: 99 mg/dL (ref 65–99)
Potassium: 4.2 mmol/L (ref 3.5–5.3)
Sodium: 141 mmol/L (ref 135–146)
Total Bilirubin: 0.7 mg/dL (ref 0.2–1.2)
Total Protein: 5.6 g/dL — ABNORMAL LOW (ref 6.1–8.1)

## 2016-10-19 LAB — CRYPTOCOCCAL AG, LTX SCR RFLX TITER
CRYPTOCOCCAL AG SCREEN: DETECTED — AB
MICRO NUMBER:: 81103978
SPECIMEN QUALITY:: ADEQUATE

## 2016-10-19 MED ORDER — FLUCONAZOLE 200 MG PO TABS: 200.0000 mg | ORAL_TABLET | Freq: Every day | ORAL | 0 refills | Status: DC

## 2016-10-19 NOTE — Telephone Encounter (Addendum)
Called from [facility] with critical lab from 10/18/16. Cryptococcal Ag (+) with titer 1:8 which is improved from previous titer 1:256 Nov 2017. Has been on chronic fluconazole tx for 1 year for cryptococcal pna. FU CT with some stable nodules.   Called Ms. Escoe and left voicemail and asked her to resume her fluconazole 200 mg QD until Dr. Linus Salmons can review. D/W Dr. Tommy Medal.   Janene Madeira, NP

## 2016-10-19 NOTE — Addendum Note (Signed)
Addended by: [place] on: 10/19/2016 06:01 PM   Modules accepted: Orders

## 2016-10-22 NOTE — Telephone Encounter (Signed)
Please see phone note from [place], she called her to have her continue fluconazole for now.    I though reviewed the lab result and is much better and not concerning so let her know that I think it is appropriate to stop the fluconazole as we discussed at her recent appt and follow up with me as discussed.  No concerns.   thanks

## 2016-10-22 NOTE — Telephone Encounter (Signed)
Patient informed not to take fluconazole and to keep follow up visit and planned.   Laverle Patter, RN

## 2016-10-23 ENCOUNTER — Ambulatory Visit: Payer: Medicare Other

## 2016-10-23 ENCOUNTER — Other Ambulatory Visit: Payer: Medicare Other

## 2016-10-24 ENCOUNTER — Encounter: Payer: Self-pay | Admitting: *Deleted

## 2016-10-24 ENCOUNTER — Ambulatory Visit: Payer: Medicare Other

## 2016-10-30 ENCOUNTER — Ambulatory Visit: Payer: Medicare Other | Admitting: Internal Medicine

## 2016-10-30 ENCOUNTER — Ambulatory Visit: Payer: Medicare Other

## 2016-10-30 ENCOUNTER — Other Ambulatory Visit: Payer: Medicare Other

## 2016-10-31 ENCOUNTER — Other Ambulatory Visit: Payer: Self-pay | Admitting: Internal Medicine

## 2016-10-31 ENCOUNTER — Ambulatory Visit: Payer: Medicare Other

## 2016-10-31 DIAGNOSIS — K219 Gastro-esophageal reflux disease without esophagitis: Secondary | ICD-10-CM

## 2016-11-06 ENCOUNTER — Other Ambulatory Visit: Payer: Medicare Other

## 2016-11-13 ENCOUNTER — Ambulatory Visit: Payer: Medicare Other | Admitting: Internal Medicine

## 2016-11-13 ENCOUNTER — Other Ambulatory Visit: Payer: Medicare Other

## 2016-11-13 ENCOUNTER — Ambulatory Visit: Payer: Medicare Other

## 2016-11-14 ENCOUNTER — Ambulatory Visit: Payer: Medicare Other

## 2016-11-20 ENCOUNTER — Other Ambulatory Visit: Payer: Medicare Other

## 2016-11-20 ENCOUNTER — Ambulatory Visit: Payer: Medicare Other

## 2016-11-21 ENCOUNTER — Ambulatory Visit: Payer: Medicare Other

## 2016-11-27 ENCOUNTER — Ambulatory Visit: Payer: Medicare Other

## 2016-11-27 ENCOUNTER — Other Ambulatory Visit: Payer: Medicare Other

## 2016-11-28 ENCOUNTER — Ambulatory Visit: Payer: Medicare Other

## 2016-11-30 ENCOUNTER — Other Ambulatory Visit: Payer: Self-pay | Admitting: Physician Assistant

## 2016-11-30 ENCOUNTER — Other Ambulatory Visit: Payer: Self-pay | Admitting: Internal Medicine

## 2016-11-30 DIAGNOSIS — K219 Gastro-esophageal reflux disease without esophagitis: Secondary | ICD-10-CM

## 2016-12-03 ENCOUNTER — Other Ambulatory Visit: Payer: Self-pay | Admitting: Internal Medicine

## 2016-12-12 ENCOUNTER — Ambulatory Visit (INDEPENDENT_AMBULATORY_CARE_PROVIDER_SITE_OTHER): Payer: Medicare Other

## 2016-12-12 ENCOUNTER — Ambulatory Visit (INDEPENDENT_AMBULATORY_CARE_PROVIDER_SITE_OTHER): Payer: Medicare Other | Admitting: Physical Medicine and Rehabilitation

## 2016-12-12 ENCOUNTER — Encounter (INDEPENDENT_AMBULATORY_CARE_PROVIDER_SITE_OTHER): Payer: Self-pay | Admitting: Physical Medicine and Rehabilitation

## 2016-12-12 VITALS — BP 142/70 | HR 70

## 2016-12-12 DIAGNOSIS — M7071 Other bursitis of hip, right hip: Secondary | ICD-10-CM | POA: Diagnosis not present

## 2016-12-12 DIAGNOSIS — M5416 Radiculopathy, lumbar region: Secondary | ICD-10-CM

## 2016-12-12 DIAGNOSIS — M5116 Intervertebral disc disorders with radiculopathy, lumbar region: Secondary | ICD-10-CM

## 2016-12-12 DIAGNOSIS — M797 Fibromyalgia: Secondary | ICD-10-CM

## 2016-12-12 DIAGNOSIS — M7061 Trochanteric bursitis, right hip: Secondary | ICD-10-CM | POA: Diagnosis not present

## 2016-12-12 DIAGNOSIS — M5136 Other intervertebral disc degeneration, lumbar region: Secondary | ICD-10-CM

## 2016-12-12 DIAGNOSIS — C9000 Multiple myeloma not having achieved remission: Secondary | ICD-10-CM

## 2016-12-12 NOTE — Progress Notes (Signed)
Nancy Norris - 76 y.o. female MRN [id number]  Date of birth: 08-10-1940  Office Visit Note: Visit Date: 12/12/2016 PCP: [name], MD Referred by: [name], MD  Subjective: Chief Complaint  Patient presents with  . Lower Back - Pain  . Right Hip - Pain   HPI: Nancy Norris is a very pleasant 76 year old female that I have not seen in a couple of years.  She comes in today with right side lower back pain referred to the right greater trochanter as well as posterior pain over the issue him.  She reports no specific injury but has been using a cane for the last few days because of the pain in the hip and leg.  She denies specific groin pain or pain getting in and out of the car.  He does report that she has to lean to the left side when sitting because of the pain posteriorly on the hip and leg.  She denies any pain past the knee or hamstring area.  She denies any tingling numbness.  She denies any real left-sided pain.  When we initially saw the patient a few years ago an MRI from 2014 showed L5-S1 degenerative changes because of disc herniation but that herniation and actually improved.  We had initially injected her from a epidural standpoint for radicular pain because of the disc herniation.  She then went on to have changes of moderate facet hypertrophy at L4-5 with some early stenosis.  This was 2014.  She has had no real radicular complaints down the leg or paresthesia.  She is again had no trauma.  No bowel or bladder issues.  Her case is very complicated though if she does have recurrent multiple myeloma she has been undergoing chemotherapy.  She also has a history of breast cancer.  Medically she is complicated with chronic stage V kidney disease, fibromyalgia, depression and anxiety.  She has congestive heart failure.  She is not on a blood thinner.  Last recorded platelets in October were 76.    Review of Systems  Constitutional: Positive for malaise/fatigue. Negative for  chills, fever and weight loss.  HENT: Negative for hearing loss and sinus pain.   Eyes: Negative for blurred vision, double vision and photophobia.  Respiratory: Negative for cough and shortness of breath.   Cardiovascular: Negative for chest pain, palpitations and leg swelling.  Gastrointestinal: Negative for abdominal pain, nausea and vomiting.  Genitourinary: Negative for flank pain.  Musculoskeletal: Positive for back pain and joint pain. Negative for myalgias.  Skin: Negative for itching and rash.  Neurological: Negative for tremors, focal weakness and weakness.  Endo/Heme/Allergies: Negative.   Psychiatric/Behavioral: Positive for depression.  All other systems reviewed and are negative.  Otherwise per HPI.  Assessment & Plan: Visit Diagnoses:  1. Greater trochanteric bursitis, right   2. Ischial bursitis of right side   3. Lumbar radiculopathy   4. Radiculopathy due to lumbar intervertebral disc disorder   5. Multiple myeloma not having achieved remission ([place])   6. Other intervertebral disc degeneration, lumbar region   7. Fibromyalgia     Plan: Findings:  Complicated patient with multiple myeloma, chemotherapy and anemia along with chronic kidney disease stage V and a history of depression anxiety and fibromyalgia.  Prior to the current state of her medical affairs she was a difficult patient to treat in some respects because of the fibromyalgia pain.  She did have known disc herniation at one point and epidural injections did help.  Pain today really seems to be more bursitis and myofascial type pain in the lumbar and if she will and greater trochanteric area.  I am not sure she would represent a great candidate for epidural injection if that were the case but I think is really more of a localized tendinitis or bursitis.  I think from a diagnostic and treatment standpoint because the pain is severe we would try a right ischial bursa and greater trochanteric injection.  We will use  a small amount of corticosteroid and a small amount of x-ray contrast.  We had a long talk on medications and the fact of anything bigger than this would probably need to be cleared by her oncologist.  Her platelet count was good enough to do an epidural injection if that were needed but again I think this is more of a bursa issue.  We had a long talk about contrast dye and kidney function.  Would only use a very small amount to see good spread without intravascular spread.  I would probably only use most of milliliter of contrast today we did not be injected pain I do not think this would be any issue with her kidneys.  Completed the injection today.  Otherwise we talked about activity modification as well as massage and foam rollers.  She may do well with regrouping with a physical therapist.    Meds & Orders: No orders of the defined types were placed in this encounter.   Orders Placed This Encounter  Procedures  . Large Joint Inj: R greater trochanter  . Large Joint Inj  . XR C-ARM NO REPORT  . MR LUMBAR SPINE WO CONTRAST    Follow-up: Return if symptoms worsen or fail to improve.   Procedures: Large Joint Inj: R greater trochanter on 12/12/2016 10:23 AM Indications: pain and diagnostic evaluation Details: 22 G 3.5 in needle, fluoroscopy-guided lateral approach  Arthrogram: No  Medications: 4 mL bupivacaine 0.25 %; 4 mL lidocaine 2 %; 40 mg triamcinolone acetonide 40 MG/ML Outcome: tolerated well, no immediate complications  There was excellent flow of contrast outlined the greater trochanteric bursa without vascular uptake. Procedure, treatment alternatives, risks and benefits explained, specific risks discussed. Consent was given by the patient. Immediately prior to procedure a time out was called to verify the correct patient, procedure, equipment, support staff and site/side marked as required. Patient was prepped and draped in the usual sterile fashion.   Large Joint Inj (R  ischium) on 12/12/2016 10:24 AM Indications: pain and diagnostic evaluation Details: 22 G 3.5 in needle, fluoroscopy-guided posterior approach  Arthrogram: No  Medications: 4 mL bupivacaine 0.25 %; 4 mL lidocaine 2 %; 40 mg triamcinolone acetonide 40 MG/ML Outcome: tolerated well, no immediate complications  There was excellent flow of contrast outlined the ischial bursa without vascular uptake. Procedure, treatment alternatives, risks and benefits explained, specific risks discussed. Consent was given by the patient. Immediately prior to procedure a time out was called to verify the correct patient, procedure, equipment, support staff and site/side marked as required. Patient was prepped and draped in the usual sterile fashion.      No notes on file   Clinical History: MRI LUMBAR SPINE WITHOUT CONTRAST 04/12/2012  Technique: Multiplanar and multiecho pulse sequences of the lumbar spine were obtained without intravenous contrast.  Comparison: MRI lumbar spine 03/10/2010 at [facility].  Findings: Normal signal is present in the conus medullaris which terminates at L1-2, within normal limits. Progressive end plate marrow changes are noted  at L5-S1. Slight anterolisthesis is present at L4-5. Limited imaging of the abdomen is unremarkable.  L1-2: Negative.  L2-3: Mild facet hypertrophy is present. There is no significant disc herniation or stenosis.  L3-4: A far right lateral disc protrusion is stable. Mild right foraminal narrowing is unchanged. The central canal is patent.  L4-5: A leftward disc bulges associated with the anterolisthesis. Moderate facet hypertrophy is present. There is some distortion of the central canal and foramina without significant stenosis.  L5-S1: There is progressive loss of disc height. The right paracentral disc protrusion is no longer evident. A more broad- based disc herniation is present without significant  stenosis.  IMPRESSION:  1. Stable mild right foraminal narrowing at L3-4 due to a far right lateral disc protrusion. 2. Stable degenerative anterolisthesis and uncovering of a leftward disc bulge. Distortion of the central canal and foramina is stable without significant stenosis. 3. Progressive loss of disc height at L5-S1 with a broad-based disc herniation but no significant compressive stenosis. 4. The previously seen right paracentral disc protrusion is less conspicuous on today's exam.  She reports that she quit smoking about 11 years ago. Her smoking use included cigarettes. She has a 30.00 pack-year smoking history. she has never used smokeless tobacco.  Recent Labs    02/20/16 1213 05/30/16 1152  HGBA1C 5.0 5.0    Objective:  VS:  HT:    WT:   BMI:     BP:(!) 142/70  HR:70bpm  TEMP: ( )  RESP:  Physical Exam  Constitutional: She is oriented to person, place, and time. She appears well-developed and well-nourished. No distress.  HENT:  Head: Normocephalic and atraumatic.  Nose: Nose normal.  Mouth/Throat: Oropharynx is clear and moist.  Eyes: Conjunctivae are normal. Pupils are equal, round, and reactive to light.  Neck: Normal range of motion. Neck supple. No JVD present. No tracheal deviation present.  Cardiovascular: Regular rhythm and intact distal pulses.  Pulmonary/Chest: Effort normal. No respiratory distress.  Abdominal: She exhibits no distension. There is no guarding.  Musculoskeletal:  Patient ambulates with a cane.  She has no groin pain with hip rotation internally or externally.  Patient does give her some pain posterior hamstring area.  She does have pretty exquisite pain over the right greater trochanter compared to left.  Have tender spots along the paraspinal PSIS and trochanter.  She has a negative slump test bilaterally.  She has good's bilaterally without deficit.  She has no clonus.  Neurological: She is alert and oriented to person, place, and  time. She exhibits normal muscle tone. Coordination normal.  Skin: Skin is warm. No rash noted. No erythema.  Psychiatric: She has a normal mood and affect. Her behavior is normal.  Nursing note and vitals reviewed.   Ortho Exam Imaging: No results found.  Past Medical/Family/Surgical/Social History: Medications & Allergies reviewed per EMR Patient Active Problem List   Diagnosis Date Noted  . Medication monitoring encounter 10/18/2016  . Nausea with vomiting 09/19/2016  . Port catheter in place 09/18/2016  . Vaginal yeast infection 01/20/2016  . Cryptococcal pneumonitis ([place]) 11/22/2015  . Lung mass 09/28/2015  . Nodule of left lung 09/13/2015  . Pneumonia 08/23/2015  . Fever 07/13/2015  . Multiple myeloma not having achieved remission ([place]) 01-04-202017  . Anemia in neoplastic disease 03/31/2015  . B12 deficiency 12/04/2014  . Thrombocytopenia ([place]) 12/04/2014  . CHF (congestive heart failure) ([place]) 07/15/2014  . Encounter for antineoplastic chemotherapy 06/21/2014  . Orthostatic hypotension 04/02/2014  .  CKD (chronic kidney disease) stage 5, GFR less than 15 ml/min ([facility]) 04/02/2014  . Multiple myeloma ([place]) 12/02/2013  . Depression, controlled 11/02/2013  . Medication management 09/30/2013  . Vitamin D deficiency 03/24/2013  . Mixed hyperlipidemia 02/24/2013  . Prediabetes 02/24/2013  . Fibromyalgia   . COPD (chronic obstructive pulmonary disease) with emphysema ([place]) 02/20/2013  . GERD (gastroesophageal reflux disease) 01/20/2013  . HTN (hypertension) 01/14/2013  . Asthma, chronic 01/13/2013  . Hypothyroid 01/13/2013  . Chronic diastolic heart failure ([place]) 01/13/2013  . HX: breast cancer 12/08/2012  . Hypercalcemia 12/02/2012   Past Medical History:  Diagnosis Date  . Anemia   . Anginal pain ([place])    used NTG x 2 May 31 and 06/15/13   . Anxiety   . Arthritis   . B12 deficiency 12/04/2014  . Breast cancer ([place])   . CKD (chronic kidney disease) stage 3, GFR 30-59  ml/min (H[facility])   . Complication of anesthesia   . COPD (chronic obstructive pulmonary disease) ([place])   . Cryptococcal pneumonitis ([place]) 11/22/2015  . Depression   . Dizziness   . Dyspnea   . Fibromyalgia   . Fibromyalgia   . GERD (gastroesophageal reflux disease)   . Headache(784.0)   . Heart murmur   . Hemoptysis 10/21/2015  . History of blood transfusion    last one May 12   . Hx of cardiovascular stress test    LexiScan with low level exercise Myoview (02/2013): No ischemia, EF 72%; normal study  . Hx of echocardiogram    a.  Echocardiogram (12/26/2012): EF 12-87%, grade 1 diastolic dysfunction;   b.  Echocardiogram (02/2013): EF 55-60%, no WMA, trivial effusion  . Hyperkalemia   . Hyponatremia   . Hypothyroidism   . Mucositis   . Multiple myeloma   . Myocardial infarction [facility])    in past, patient was unaware.   . Neuropathy   . Pain in joint, pelvic region and thigh 07/07/2015  . PONV (postoperative nausea and vomiting) 2008   after mastestomy   Family History  Problem Relation Age of Onset  . Arthritis Mother   . Asthma Mother   . Cancer Sister   . Hyperlipidemia Brother    Past Surgical History:  Procedure Laterality Date  . ABDOMINAL HYSTERECTOMY  1981  . AV FISTULA PLACEMENT Left 06/19/2013   Procedure: CREATION OF LEFT ARM ARTERIOVENOUS (AV) FISTULA ;  Surgeon: Angelia Mould, MD;  Location: [facility];  Service: Vascular;  Laterality: Left;  . BREAST RECONSTRUCTION    . BREAST SURGERY    . CATARACT EXTRACTION, BILATERAL    . CHOLECYSTECTOMY  1971  . EYE SURGERY Bilateral    lens implant  . history of Port removal    . LUNG BIOPSY  10/21/2015  . MASTECTOMY Left 2008  . PORTACATH PLACEMENT  12/2012   has had 2  . Status post stem cell transplant on September 28, 2008.     Social History   Occupational History  . Occupation: retired-sect.(Clinical cytogeneticist)  Tobacco Use  . Smoking status: Former Smoker    Packs/day: 1.00    Years: 30.00    Pack years: 30.00     Types: Cigarettes    Last attempt to quit: 02/15/2005    Years since quitting: 11.8  . Smokeless tobacco: Never Used  Substance and Sexual Activity  . Alcohol use: No  . Drug use: No  . Sexual activity: No

## 2016-12-12 NOTE — Patient Instructions (Signed)

## 2016-12-12 NOTE — Progress Notes (Deleted)
Right sided lower back pain. Has to lean to left side when sitting. Right thigh pain- mostly posterior. Has been using cane for last few days.

## 2016-12-20 ENCOUNTER — Encounter (INDEPENDENT_AMBULATORY_CARE_PROVIDER_SITE_OTHER): Payer: Self-pay | Admitting: Physical Medicine and Rehabilitation

## 2016-12-20 MED ORDER — LIDOCAINE HCL 2 % IJ SOLN
4.0000 mL | INTRAMUSCULAR | Status: AC | PRN
  Administered 2016-12-12: 4 mL

## 2016-12-20 MED ORDER — BUPIVACAINE HCL 0.25 % IJ SOLN
4.0000 mL | INTRAMUSCULAR | Status: AC | PRN
  Administered 2016-12-12: 4 mL via INTRA_ARTICULAR

## 2016-12-20 MED ORDER — TRIAMCINOLONE ACETONIDE 40 MG/ML IJ SUSP
40.0000 mg | INTRAMUSCULAR | Status: AC | PRN
  Administered 2016-12-12: 40 mg via INTRA_ARTICULAR

## 2017-01-01 ENCOUNTER — Ambulatory Visit
Admission: RE | Admit: 2017-01-01 | Discharge: 2017-01-01 | Disposition: A | Discharge status: Home / Self Care | Payer: Medicare Other | Source: Ambulatory Visit | Attending: Physical Medicine and Rehabilitation | Admitting: Physical Medicine and Rehabilitation

## 2017-01-01 DIAGNOSIS — M48061 Spinal stenosis, lumbar region without neurogenic claudication: Secondary | ICD-10-CM | POA: Diagnosis not present

## 2017-01-01 DIAGNOSIS — M5136 Other intervertebral disc degeneration, lumbar region: Secondary | ICD-10-CM

## 2017-01-04 ENCOUNTER — Telehealth (INDEPENDENT_AMBULATORY_CARE_PROVIDER_SITE_OTHER): Payer: Self-pay | Admitting: Physical Medicine and Rehabilitation

## 2017-01-17 ENCOUNTER — Other Ambulatory Visit (HOSPITAL_BASED_OUTPATIENT_CLINIC_OR_DEPARTMENT_OTHER): Payer: Medicare Other

## 2017-01-17 DIAGNOSIS — C9000 Multiple myeloma not having achieved remission: Secondary | ICD-10-CM

## 2017-01-17 DIAGNOSIS — N2581 Secondary hyperparathyroidism of renal origin: Secondary | ICD-10-CM | POA: Diagnosis not present

## 2017-01-17 DIAGNOSIS — I5030 Unspecified diastolic (congestive) heart failure: Secondary | ICD-10-CM | POA: Diagnosis not present

## 2017-01-17 DIAGNOSIS — N390 Urinary tract infection, site not specified: Secondary | ICD-10-CM | POA: Diagnosis not present

## 2017-01-17 DIAGNOSIS — D630 Anemia in neoplastic disease: Secondary | ICD-10-CM | POA: Diagnosis not present

## 2017-01-17 DIAGNOSIS — R809 Proteinuria, unspecified: Secondary | ICD-10-CM | POA: Diagnosis not present

## 2017-01-17 DIAGNOSIS — R911 Solitary pulmonary nodule: Secondary | ICD-10-CM | POA: Diagnosis not present

## 2017-01-17 DIAGNOSIS — D631 Anemia in chronic kidney disease: Secondary | ICD-10-CM | POA: Diagnosis not present

## 2017-01-17 DIAGNOSIS — B450 Pulmonary cryptococcosis: Secondary | ICD-10-CM | POA: Diagnosis not present

## 2017-01-17 DIAGNOSIS — I770 Arteriovenous fistula, acquired: Secondary | ICD-10-CM | POA: Diagnosis not present

## 2017-01-17 DIAGNOSIS — N184 Chronic kidney disease, stage 4 (severe): Secondary | ICD-10-CM | POA: Diagnosis not present

## 2017-01-17 DIAGNOSIS — I951 Orthostatic hypotension: Secondary | ICD-10-CM | POA: Diagnosis not present

## 2017-01-17 LAB — COMPREHENSIVE METABOLIC PANEL
ALT: 22 U/L (ref 0–55)
AST: 20 U/L (ref 5–34)
Albumin: 3.9 g/dL (ref 3.5–5.0)
Alkaline Phosphatase: 110 U/L (ref 40–150)
Anion Gap: 11 mEq/L (ref 3–11)
BUN: 35 mg/dL — AB (ref 7.0–26.0)
CALCIUM: 9.1 mg/dL (ref 8.4–10.4)
CHLORIDE: 108 meq/L (ref 98–109)
CO2: 25 mEq/L (ref 22–29)
Creatinine: 2.8 mg/dL — ABNORMAL HIGH (ref 0.6–1.1)
EGFR: 16 mL/min/{1.73_m2} — ABNORMAL LOW (ref >60)
GLUCOSE: 103 mg/dL (ref 70–140)
POTASSIUM: 3.7 meq/L (ref 3.5–5.1)
SODIUM: 144 meq/L (ref 136–145)
Total Bilirubin: 0.73 mg/dL (ref 0.20–1.20)
Total Protein: 6.4 g/dL (ref 6.4–8.3)

## 2017-01-17 LAB — CBC WITH DIFFERENTIAL/PLATELET
BASO%: 1.1 % (ref 0.0–2.0)
Basophils Absolute: 0.1 10*3/uL (ref 0.0–0.1)
EOS ABS: 0.1 10*3/uL (ref 0.0–0.5)
EOS%: 1.9 % (ref 0.0–7.0)
HEMATOCRIT: 33.9 % — AB (ref 34.8–46.6)
HGB: 11.2 g/dL — ABNORMAL LOW (ref 11.6–15.9)
LYMPH%: 20.5 % (ref 14.0–49.7)
MCH: 36.7 pg — ABNORMAL HIGH (ref 25.1–34.0)
MCHC: 33.1 g/dL (ref 31.5–36.0)
MCV: 111 fL — AB (ref 79.5–101.0)
MONO#: 0.6 10*3/uL (ref 0.1–0.9)
MONO%: 11 % (ref 0.0–14.0)
NEUT%: 65.5 % (ref 38.4–76.8)
NEUTROS ABS: 3.4 10*3/uL (ref 1.5–6.5)
PLATELETS: 117 10*3/uL — AB (ref 145–400)
RBC: 3.06 10*6/uL — AB (ref 3.70–5.45)
RDW: 14.5 % (ref 11.2–14.5)
WBC: 5.1 10*3/uL (ref 3.9–10.3)
lymph#: 1 10*3/uL (ref 0.9–3.3)

## 2017-01-17 LAB — LACTATE DEHYDROGENASE: LDH: 202 U/L (ref 125–245)

## 2017-01-17 NOTE — Telephone Encounter (Signed)
Scheduled

## 2017-01-18 LAB — KAPPA/LAMBDA LIGHT CHAINS
IG KAPPA FREE LIGHT CHAIN: 27.4 mg/L — AB (ref 3.3–19.4)
IG LAMBDA FREE LIGHT CHAIN: 6.1 mg/L (ref 5.7–26.3)
KAPPA/LAMBDA FLC RATIO: 4.49 — AB (ref 0.26–1.65)

## 2017-01-18 LAB — BETA 2 MICROGLOBULIN, SERUM: Beta-2: 6.4 mg/L — ABNORMAL HIGH (ref 0.6–2.4)

## 2017-01-18 LAB — IGG, IGA, IGM
IGA/IMMUNOGLOBULIN A, SERUM: 18 mg/dL — AB (ref 64–422)
IGG (IMMUNOGLOBIN G), SERUM: 290 mg/dL — AB (ref 700–1600)
IGM (IMMUNOGLOBIN M), SRM: 6 mg/dL — AB (ref 26–217)

## 2017-01-22 ENCOUNTER — Other Ambulatory Visit: Payer: Self-pay | Admitting: Internal Medicine

## 2017-01-23 ENCOUNTER — Telehealth: Payer: Self-pay | Admitting: *Deleted

## 2017-01-23 ENCOUNTER — Ambulatory Visit: Payer: Medicare Other | Admitting: Internal Medicine

## 2017-01-23 NOTE — Telephone Encounter (Signed)
Call from [place] with [facility] stating that MD there is requesting appt today for mutual patient. Patient is c/o cough and chest tightness. Concern is for right side pneumonia. Dr. Linus Salmons had available appt today at 2:30 pm and patient was added to the schedule. Ebony notified and she stated she will call patient with this appt info. Myrtis Hopping CMA

## 2017-01-23 NOTE — Telephone Encounter (Signed)
Patient called and stated she can not come today because she uses [employer] and they need a three day notice. She stated that she sees her oncologist tomorrow and if she needs a chest xray she can get it done tomorrow at that office. She said that her kidney specialist was the one that said she needed to get in to see Dr. Linus Salmons asap. I gave her the fax # and said she could request the results be sent to Dr. Linus Salmons.  MD may be able to see these results in Epic.

## 2017-01-24 ENCOUNTER — Telehealth: Payer: Self-pay | Admitting: Internal Medicine

## 2017-01-24 ENCOUNTER — Ambulatory Visit (HOSPITAL_COMMUNITY)
Admission: RE | Admit: 2017-01-24 | Discharge: 2017-01-24 | Disposition: A | Discharge status: Home / Self Care | Payer: Medicare Other | Source: Ambulatory Visit | Attending: Internal Medicine | Admitting: Internal Medicine

## 2017-01-24 ENCOUNTER — Inpatient Hospital Stay: Payer: Medicare Other | Attending: Internal Medicine | Admitting: Internal Medicine

## 2017-01-24 ENCOUNTER — Encounter: Payer: Self-pay | Admitting: Internal Medicine

## 2017-01-24 VITALS — BP 121/57 | HR 72 | Temp 98.2°F | Resp 18 | Ht 65.5 in | Wt 178.6 lb

## 2017-01-24 DIAGNOSIS — C9000 Multiple myeloma not having achieved remission: Secondary | ICD-10-CM

## 2017-01-24 DIAGNOSIS — R918 Other nonspecific abnormal finding of lung field: Secondary | ICD-10-CM | POA: Diagnosis not present

## 2017-01-24 DIAGNOSIS — C9002 Multiple myeloma in relapse: Secondary | ICD-10-CM | POA: Diagnosis not present

## 2017-01-24 DIAGNOSIS — Z86000 Personal history of in-situ neoplasm of breast: Secondary | ICD-10-CM | POA: Diagnosis not present

## 2017-01-24 DIAGNOSIS — N183 Chronic kidney disease, stage 3 (moderate): Secondary | ICD-10-CM | POA: Diagnosis not present

## 2017-01-24 DIAGNOSIS — G620 Drug-induced polyneuropathy: Secondary | ICD-10-CM

## 2017-01-24 DIAGNOSIS — Z79899 Other long term (current) drug therapy: Secondary | ICD-10-CM | POA: Diagnosis not present

## 2017-01-24 DIAGNOSIS — R530 Neoplastic (malignant) related fatigue: Secondary | ICD-10-CM | POA: Insufficient documentation

## 2017-01-24 DIAGNOSIS — B450 Pulmonary cryptococcosis: Secondary | ICD-10-CM | POA: Diagnosis not present

## 2017-01-24 DIAGNOSIS — R05 Cough: Secondary | ICD-10-CM | POA: Diagnosis not present

## 2017-01-24 NOTE — Telephone Encounter (Signed)
Scheduled appt per 1/10 los - Patient did not want AVS or calender printed - aware of appt date and time and that pt needs to walk to radiology to xray.

## 2017-01-24 NOTE — Telephone Encounter (Signed)
Patient called to advise that she was able to get the chest xray today 01/24/17 at 1230 and it should be ready.

## 2017-01-24 NOTE — Telephone Encounter (Signed)
Please see phone note  

## 2017-01-24 NOTE — Progress Notes (Signed)
[place] [phone]   [phone]  OFFICE PROGRESS NOTE  Nancy Norris, [place] [address]  DIAGNOSIS:  1. 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 Recurrent multiple myeloma initially diagnosed as a smoldering myeloma at [facility] in 2002. 3. Ductal carcinoma in situ status post mastectomy with sentinel lymph node biopsy in October 2008.  PRIOR THERAPY: 1. Status post treatment with tamoxifen from November 2008 through February 2009, discontinued secondary to intolerance. 2. Status post 3 cycles of chemotherapy with Revlimid and Decadron followed by 1 cycle of Decadron only with mild response. 3. Status post 2 cycles of systemic chemotherapy with Velcade, Doxil and Decadron discontinued secondary to significant peripheral neuropathy. Last dose was given May 2010 at [facility]. 4. Status post autologous peripheral blood stem cell transplant on October 01, 2008 at [facility] under the care of Dr. Phyllis Norris.  5. Systemic chemotherapy with Carfilzomib initially was 20 mg/M2 and will be increased after cycle #1 to 36 mg/M2 on days 1, 2, 8, 9, 15 and 16 every 4 weeks in addition to Cytoxan 300 mg/M2 and Decadron 40 mg by mouth weekly basis, status post 4 cycles. First cycle on 12/29/2012. 6. She resumed chemotherapy with Carfilzomib, Cytoxan, and Decadron on 05/03/2014. Status post 6 cycles. 7. systemic chemotherapy again with Carfilzomib, Cytoxan and Decadron on 07/11/2015. Status post 3 cycles. 8. She completed a course of treatment with Diflucan 200 mg by mouth daily for around 6 weeks for cryptococcal infection of the lung.  CURRENT THERAPY: Resuming treatment again with Carfilzomib, Cytoxan and Decadron, first cycle 04/23/2016. Status post 7 cycles  INTERVAL HISTORY: Nancy Norris 77 y.o. female returns to the clinic today for follow-up visit.  The  patient is feeling fine today with no specific complaints except for chest congestion as well as cough productive of whitish sputum.  She denied having any fever or chills.  She has no nausea, vomiting, diarrhea or constipation.  She has been on observation now for several months.  The patient had repeat myeloma panel performed recently and she is here for evaluation and discussion of her lab results and treatment options.  MEDICAL HISTORY: Past Medical History:  Diagnosis Date  . Anemia   . Anginal pain ([place])    used NTG x 2 May 31 and 06/15/13   . Anxiety   . Arthritis   . B12 deficiency 12/04/2014  . Breast cancer ([place])   . CKD (chronic kidney disease) stage 3, GFR 30-59 ml/min ([facility])   . Complication of anesthesia   . COPD (chronic obstructive pulmonary disease) ([place])   . Cryptococcal pneumonitis ([place]) 11/22/2015  . Depression   . Dizziness   . Dyspnea   . Fibromyalgia   . Fibromyalgia   . GERD (gastroesophageal reflux disease)   . Headache(784.0)   . Heart murmur   . Hemoptysis 10/21/2015  . History of blood transfusion    last one May 12   . Hx of cardiovascular stress test    LexiScan with low level exercise Myoview (02/2013): No ischemia, EF 72%; normal study  . Hx of echocardiogram    a.  Echocardiogram (12/26/2012): EF 56-97%, grade 1 diastolic dysfunction;   b.  Echocardiogram (02/2013): EF 55-60%, no WMA, trivial effusion  . Hyperkalemia   . Hyponatremia   . Hypothyroidism   . Mucositis   . Multiple myeloma   . Myocardial  infarction [facility])    in past, patient was unaware.   . Neuropathy   . Pain in joint, pelvic region and thigh 07/07/2015  . PONV (postoperative nausea and vomiting) 2008   after mastestomy    ALLERGIES:  is allergic to codeine; latex; other; onion; zyprexa [olanzapine]; adhesive [tape]; hydrocodone; iodinated diagnostic agents; and sulfa antibiotics.  MEDICATIONS:  Current Outpatient Medications  Medication Sig Dispense Refill  . acetaminophen  (TYLENOL) 325 MG tablet Take 650 mg by mouth every 6 (six) hours as needed for mild pain or moderate pain.     Marland Kitchen albuterol (PROVENTIL HFA;VENTOLIN HFA) 108 (90 Base) MCG/ACT inhaler Inhale 1-2 puffs into the lungs every 6 (six) hours as needed for wheezing or shortness of breath (cough). 1 Inhaler 3  . butalbital-acetaminophen-caffeine (FIORICET, ESGIC) 50-325-40 MG tablet Take 1 tablet by mouth every 4 (four) hours as needed.  3  . Cholecalciferol 4000 UNITS TABS Take 4,000 Units by mouth daily.    . citalopram (CELEXA) 40 MG tablet TAKE 1 TABLET BY MOUTH DAILY OR AS DIRECTED 90 tablet 0  . cromolyn (OPTICROM) 4 % ophthalmic solution Place 1 drop into both eyes 4 (four) times daily.    Marland Kitchen dexamethasone (DECADRON) 4 MG tablet Take 1 tablet (4 mg total) by mouth daily. Take 10 tablets by mouth once a week as directed. 30 tablet 1  . fluconazole (DIFLUCAN) 200 MG tablet Take 1 tablet (200 mg total) by mouth daily. 30 tablet 0  . furosemide (LASIX) 40 MG tablet TAKE 1 TABLET BY MOUTH DAILY. 90 tablet 1  . gabapentin (NEURONTIN) 300 MG capsule TAKE 1 CAPSULE BY MOUTH AT BEDTIME. 90 capsule 3  . levothyroxine (SYNTHROID, LEVOTHROID) 150 MCG tablet Take 1 tablet daily - need office visit before further refills 30 tablet 0  . loperamide (IMODIUM) 2 MG capsule Take 1 capsule (2 mg total) by mouth as needed for diarrhea or loose stools. 30 capsule 2  . magic mouthwash SOLN Take 5 mLs by mouth 4 (four) times daily as needed for mouth pain. 240 mL 1  . montelukast (SINGULAIR) 10 MG tablet TAKE 1 TABLET BY MOUTH DAILY. 90 tablet 1  . nitroGLYCERIN (NITROSTAT) 0.4 MG SL tablet Place 0.4 mg under the tongue every 5 (five) minutes as needed for chest pain. Reported on 03/30/2015    . nystatin (MYCOSTATIN/NYSTOP) powder Apply topically 2 (two) times daily. 30 g 0  . pilocarpine (SALAGEN) 5 MG tablet TAKE 1 TABLET BY MOUTH 3 TIMES DAILY FOR DRY MOUTH. 270 tablet 1  . PRESCRIPTION MEDICATION Pt gets chemo at [facility] [address]    .  ranitidine (ZANTAC) 300 MG tablet TAKE 1 TO 2 TABLETS DAILY FOR HEARTBURN & REFLUX 180 tablet 0  . senna (SENOKOT) 8.6 MG tablet Take 1 tablet by mouth at bedtime.    . traMADol (ULTRAM) 50 MG tablet Take 1 to 2 tablets by mouth 4 times a day as needed for pain. 180 tablet 0  . vitamin C (ASCORBIC ACID) 250 MG tablet Take 250 mg by mouth daily.    Marland Kitchen amphetamine-dextroamphetamine (ADDERALL) 20 MG tablet Take 1/2 to 1 tablet 1 or 2 x / day for focusing/concentration & Depression 60 tablet 0  . azelastine (ASTELIN) 0.1 % nasal spray Place 2 sprays into both nostrils 2 (two) times daily. Use in each nostril as directed 30 mL 2  . ferrous sulfate 325 (65 FE) MG tablet Take 1 tablet daily or as directed 90 tablet 1   Current Facility-Administered  Medications  Medication Dose Route Frequency Provider Last Rate Last Dose  . cefTRIAXone (ROCEPHIN) 1 g in dextrose 5 % 50 mL IVPB  1 g Intravenous Q24H Mikey Bussing R, NP   1 g at 09/19/16 1019    SURGICAL HISTORY:  Past Surgical History:  Procedure Laterality Date  . ABDOMINAL HYSTERECTOMY  1981  . AV FISTULA PLACEMENT Left 06/19/2013   Procedure: CREATION OF LEFT ARM ARTERIOVENOUS (AV) FISTULA ;  Surgeon: Angelia Mould, MD;  Location: [facility];  Service: Vascular;  Laterality: Left;  . BREAST RECONSTRUCTION    . BREAST SURGERY    . CATARACT EXTRACTION, BILATERAL    . CHOLECYSTECTOMY  1971  . EYE SURGERY Bilateral    lens implant  . history of Port removal    . LUNG BIOPSY  10/21/2015  . MASTECTOMY Left 2008  . PORTACATH PLACEMENT  12/2012   has had 2  . Status post stem cell transplant on September 28, 2008.      REVIEW OF SYSTEMS:  Constitutional: positive for fatigue Eyes: negative Ears, nose, mouth, throat, and face: positive for nasal congestion Respiratory: positive for cough and sputum Cardiovascular: negative Gastrointestinal: negative Genitourinary:negative Integument/breast: negative Hematologic/lymphatic:  negative Musculoskeletal:negative Neurological: negative Behavioral/Psych: negative Endocrine: negative Allergic/Immunologic: negative   PHYSICAL EXAMINATION: General appearance: alert, cooperative, fatigued and no distress Head: Normocephalic, without obvious abnormality, atraumatic Neck: no adenopathy, no JVD, supple, symmetrical, trachea midline and thyroid not enlarged, symmetric, no tenderness/mass/nodules Lymph nodes: Cervical, supraclavicular, and axillary nodes normal. Resp: clear to auscultation bilaterally Back: symmetric, no curvature. ROM normal. No CVA tenderness. Cardio: regular rate and rhythm, S1, S2 normal, no murmur, click, rub or gallop GI: soft, non-tender; bowel sounds normal; no masses,  no organomegaly Extremities: extremities normal, atraumatic, no cyanosis or edema Neurologic: Alert and oriented X 3, normal strength and tone. Normal symmetric reflexes. Normal coordination and gait  ECOG PERFORMANCE STATUS: 1 - Symptomatic but completely ambulatory  Blood pressure (!) 121/57, pulse 72, temperature 98.2 F (36.8 C), temperature source Oral, resp. rate 18, height 5' 5.5" (1.664 m), weight 178 lb 9.6 oz (81 kg), SpO2 98 %.  LABORATORY DATA: Lab Results  Component Value Date   WBC 5.1 01/17/2017   HGB 11.2 (L) 01/17/2017   HCT 33.9 (L) 01/17/2017   MCV 111.0 (H) 01/17/2017   PLT 117 (L) 01/17/2017      Chemistry      Component Value Date/Time   NA 144 01/17/2017 1146   K 3.7 01/17/2017 1146   CL 108 10/18/2016 0934   CL 105 03/19/2012 0811   CO2 25 01/17/2017 1146   BUN 35.0 (H) 01/17/2017 1146   CREATININE 2.8 (H) 01/17/2017 1146      Component Value Date/Time   CALCIUM 9.1 01/17/2017 1146   ALKPHOS 110 01/17/2017 1146   AST 20 01/17/2017 1146   ALT 22 01/17/2017 1146   BILITOT 0.73 01/17/2017 1146       RADIOGRAPHIC STUDIES: Mr Lumbar Spine Wo Contrast  Result Date: 01/01/2017 CLINICAL DATA:  Low back pain and right leg pain over the  last 6 months. History of breast cancer. EXAM: MRI LUMBAR SPINE WITHOUT CONTRAST TECHNIQUE: Multiplanar, multisequence MR imaging of the lumbar spine was performed. No intravenous contrast was administered. COMPARISON:  CT abdomen 11/23/2013 FINDINGS: Segmentation:  5 lumbar type vertebral bodies. Alignment: Mild curvature convex to the right. 2 mm anterolisthesis L4-5. Vertebrae: No fracture or primary bone lesion. No sign of metastatic cancer. Conus medullaris and cauda  equina: Conus extends to the L1 level. Conus and cauda equina appear normal. Paraspinal and other soft tissues: Renal atrophy. Otherwise negative. Disc levels: No significant finding at L2-3 or above.  No stenosis. L3-4: Bulging of the disc, focally prominent in the right extraforaminal region. Mild facet hypertrophy. Mild narrowing of both lateral recesses but no neural compression in the spinal canal. There is some potential that the extraforaminal disc protrusion could irritate the right L3 nerve. L4-5: Bilateral facet osteoarthritis with 2 mm anterolisthesis. Mild bulging of the disc. No compressive stenosis. L5-S1: Chronic disc degeneration with loss of disc height. Endplate osteophytes and bulging of the disc. Very large spinal canal this level. No stenosis or neural compression. IMPRESSION: L3-4: Small right extraforaminal disc protrusion could focally irritate the right L3 nerve. L4-5: Chronic appearing facet osteoarthritis with 2 mm of anterolisthesis. Mild bulging of the disc. No compressive stenosis. L5-S1: Chronic disc degeneration and minor disc bulge. No stenosis or neural compression. Electronically Signed   By: Nelson Chimes M.D.   On: 01/01/2017 08:52    ASSESSMENT AND PLAN:  This is a very pleasant 77 years old white female with relapsed multiple myeloma status post several chemotherapy regimens and she is currently on treatment with systemic chemotherapy with Carfilzomib, Cytoxan and Decadron status post 7 cycles. She has  been tolerating this treatment well except for fatigue. The patient has been in observation now for several months.  She is doing fine. The recent myeloma panel showed mild increase in the free kappa light chain. I discussed the lab results with the patient.  I recommended for her to continue in observation with repeat myeloma panel in 2 months. For the chest congestion and questionable bronchitis, I would order chest x-ray to rule out pneumonia.  She is supposed to see Dr. Alton Revere tomorrow and he would like to have chest x-ray done before the visit. For the chronic renal insufficiency, she is followed by nephrology. For the peripheral neuropathy, she will continue her current treatment with Lyrica. The patient was advised to call immediately if she has any concerning symptoms in the interval. The patient voices understanding of current disease status and treatment options and is in agreement with the current care plan. All questions were answered. The patient knows to call the clinic with any problems, questions or concerns. We can certainly see the patient much sooner if necessary.  Disclaimer: This note was dictated with voice recognition software. Similar sounding words can inadvertently be transcribed and may not be corrected upon review.

## 2017-01-24 NOTE — Telephone Encounter (Signed)
No concerns noted on cxr so ok from that standpoint.

## 2017-01-25 NOTE — Telephone Encounter (Signed)
Patient called back and I notified her of Dr. Henreitta Leber note. She said she is happy to hear she does not have pneumonia.

## 2017-01-25 NOTE — Telephone Encounter (Signed)
Called patient and left a voice mail to return call to [facility] triage.

## 2017-01-28 ENCOUNTER — Ambulatory Visit (INDEPENDENT_AMBULATORY_CARE_PROVIDER_SITE_OTHER): Payer: Medicare Other

## 2017-01-28 ENCOUNTER — Ambulatory Visit (INDEPENDENT_AMBULATORY_CARE_PROVIDER_SITE_OTHER): Payer: Medicare Other | Admitting: Physical Medicine and Rehabilitation

## 2017-01-28 ENCOUNTER — Encounter (INDEPENDENT_AMBULATORY_CARE_PROVIDER_SITE_OTHER): Payer: Self-pay | Admitting: Physical Medicine and Rehabilitation

## 2017-01-28 VITALS — BP 117/68 | HR 67 | Temp 97.8°F

## 2017-01-28 DIAGNOSIS — M5116 Intervertebral disc disorders with radiculopathy, lumbar region: Secondary | ICD-10-CM

## 2017-01-28 DIAGNOSIS — M5416 Radiculopathy, lumbar region: Secondary | ICD-10-CM

## 2017-01-28 MED ORDER — BETAMETHASONE SOD PHOS & ACET 6 (3-3) MG/ML IJ SUSP
12.0000 mg | Freq: Once | INTRAMUSCULAR | Status: AC
  Administered 2017-01-28: 12 mg

## 2017-01-28 NOTE — Progress Notes (Deleted)
Pt states a burning lower back pain that radiates to the right thigh. Pt states pain has been going on for a few years. Pt states previous injection helped out a lot. Pt states sitting for a long period of time makes it worse, standing eases pain. +Driver, -BT, +Dye Allergies (pt states she cannot use from having stage 4 kidney failure).

## 2017-01-28 NOTE — Patient Instructions (Signed)

## 2017-01-29 NOTE — Procedures (Signed)
Nancy Norris is a 77 year old female that we have seen over the years with good results with epidural injection and infrequent trochanteric bursa injection.  She continues to have pain that radiates from her back into the right anterior lateral and lateral thigh.  This is been a chronic issue with worsening periods.  She reports that the prior greater trochanteric injection did help.  We have updated her MRI because her MRI was from some time ago we were concerned about worsening stenosis or new problem.  MRI was reviewed with her and reviewed below in the note.  Basically not real big changes from the last MRI which is good.  She continues to have this significant lateral L3 disc herniation which I think is the culprit of her hip and thigh pain.  We are going to complete a right L3 transforaminal injection today.  She does endorse an allergy to contrast but is really because she has stage IV chronic kidney disease.  We really only use a small amount of contrast may be a half of milliliter this would not affect her kidneys and she knows we have done this in the past and we discussed that again.  Lumbosacral Transforaminal Epidural Steroid Injection - Sub-Pedicular Approach with Fluoroscopic Guidance  Patient: Nancy Norris      Date of Birth: November 27, 1940 MRN: [id number] PCP: [name], MD      Visit Date: 01/28/2017   Universal Protocol:    Date/Time: 01/28/2017  Consent Given By: the patient  Position: PRONE  Additional Comments: Vital signs were monitored before and after the procedure. Patient was prepped and draped in the usual sterile fashion. The correct patient, procedure, and site was verified.   Injection Procedure Details:  Procedure Site One Meds Administered:  Meds ordered this encounter  Medications  . betamethasone acetate-betamethasone sodium phosphate (CELESTONE) injection 12 mg    Laterality: Right  Location/Site:  L3-L4  Needle size: 22 G  Needle type:  Spinal  Needle Placement: Transforaminal  Findings:    -Comments: Excellent flow of contrast along the nerve and into the epidural space.  Procedure Details: After squaring off the end-plates to get a true AP view, the C-arm was positioned so that an oblique view of the foramen as noted above was visualized. The target area is just inferior to the "nose of the scotty dog" or sub pedicular. The soft tissues overlying this structure were infiltrated with 2-3 ml. of 1% Lidocaine without Epinephrine.  The spinal needle was inserted toward the target using a "trajectory" view along the fluoroscope beam.  Under AP and lateral visualization, the needle was advanced so it did not puncture dura and was located close the 6 O'Clock position of the pedical in AP tracterory. Biplanar projections were used to confirm position. Aspiration was confirmed to be negative for CSF and/or blood. A 1-2 ml. volume of Isovue-250 was injected and flow of contrast was noted at each level. Radiographs were obtained for documentation purposes.   After attaining the desired flow of contrast documented above, a 0.5 to 1.0 ml test dose of 0.25% Marcaine was injected into each respective transforaminal space.  The patient was observed for 90 seconds post injection.  After no sensory deficits were reported, and normal lower extremity motor function was noted,   the above injectate was administered so that equal amounts of the injectate were placed at each foramen (level) into the transforaminal epidural space.   Additional Comments:  The patient tolerated the procedure well Dressing:  Band-Aid    Post-procedure details: Patient was observed during the procedure. Post-procedure instructions were reviewed.  Patient left the clinic in stable condition.   Pertinent Imaging: MRI LUMBAR SPINE WITHOUT CONTRAST  TECHNIQUE: Multiplanar, multisequence MR imaging of the lumbar spine was performed. No intravenous contrast was  administered.  COMPARISON:  CT abdomen 11/23/2013  FINDINGS: Segmentation:  5 lumbar type vertebral bodies.  Alignment: Mild curvature convex to the right. 2 mm anterolisthesis L4-5.  Vertebrae: No fracture or primary bone lesion. No sign of metastatic cancer.  Conus medullaris and cauda equina: Conus extends to the L1 level. Conus and cauda equina appear normal.  Paraspinal and other soft tissues: Renal atrophy. Otherwise negative.  Disc levels:  No significant finding at L2-3 or above.  No stenosis.  L3-4: Bulging of the disc, focally prominent in the right extraforaminal region. Mild facet hypertrophy. Mild narrowing of both lateral recesses but no neural compression in the spinal canal. There is some potential that the extraforaminal disc protrusion could irritate the right L3 nerve.  L4-5: Bilateral facet osteoarthritis with 2 mm anterolisthesis. Mild bulging of the disc. No compressive stenosis.  L5-S1: Chronic disc degeneration with loss of disc height. Endplate osteophytes and bulging of the disc. Very large spinal canal this level. No stenosis or neural compression.  IMPRESSION: L3-4: Small right extraforaminal disc protrusion could focally irritate the right L3 nerve.  L4-5: Chronic appearing facet osteoarthritis with 2 mm of anterolisthesis. Mild bulging of the disc. No compressive stenosis.  L5-S1: Chronic disc degeneration and minor disc bulge. No stenosis or neural compression.   Electronically Signed   By: Nelson Chimes M.D.   On: 01/01/2017 08:52

## 2017-02-26 DIAGNOSIS — H353131 Nonexudative age-related macular degeneration, bilateral, early dry stage: Secondary | ICD-10-CM | POA: Diagnosis not present

## 2017-02-26 DIAGNOSIS — H02052 Trichiasis without entropian right lower eyelid: Secondary | ICD-10-CM | POA: Diagnosis not present

## 2017-02-26 DIAGNOSIS — H04123 Dry eye syndrome of bilateral lacrimal glands: Secondary | ICD-10-CM | POA: Diagnosis not present

## 2017-02-26 DIAGNOSIS — Z961 Presence of intraocular lens: Secondary | ICD-10-CM | POA: Diagnosis not present

## 2017-02-26 DIAGNOSIS — H26493 Other secondary cataract, bilateral: Secondary | ICD-10-CM | POA: Diagnosis not present

## 2017-03-04 DIAGNOSIS — H26492 Other secondary cataract, left eye: Secondary | ICD-10-CM | POA: Diagnosis not present

## 2017-03-13 ENCOUNTER — Ambulatory Visit (INDEPENDENT_AMBULATORY_CARE_PROVIDER_SITE_OTHER): Payer: Medicare Other | Admitting: Adult Health

## 2017-03-13 ENCOUNTER — Ambulatory Visit: Payer: Self-pay | Admitting: Adult Health

## 2017-03-13 ENCOUNTER — Encounter: Payer: Self-pay | Admitting: Adult Health

## 2017-03-13 VITALS — BP 98/50 | HR 62 | Temp 97.5°F | Ht 65.5 in | Wt 169.0 lb

## 2017-03-13 DIAGNOSIS — R05 Cough: Secondary | ICD-10-CM | POA: Diagnosis not present

## 2017-03-13 DIAGNOSIS — R059 Cough, unspecified: Secondary | ICD-10-CM

## 2017-03-13 DIAGNOSIS — R300 Dysuria: Secondary | ICD-10-CM | POA: Diagnosis not present

## 2017-03-13 DIAGNOSIS — N185 Chronic kidney disease, stage 5: Secondary | ICD-10-CM

## 2017-03-13 DIAGNOSIS — J441 Chronic obstructive pulmonary disease with (acute) exacerbation: Secondary | ICD-10-CM

## 2017-03-13 DIAGNOSIS — R0602 Shortness of breath: Secondary | ICD-10-CM | POA: Diagnosis not present

## 2017-03-13 DIAGNOSIS — I5032 Chronic diastolic (congestive) heart failure: Secondary | ICD-10-CM | POA: Diagnosis not present

## 2017-03-13 MED ORDER — PREDNISONE 20 MG PO TABS: ORAL_TABLET | ORAL | 0 refills | Status: DC

## 2017-03-13 MED ORDER — IPRATROPIUM-ALBUTEROL 0.5-2.5 (3) MG/3ML IN SOLN: 3.0000 mL | Freq: Once | RESPIRATORY_TRACT | Status: DC

## 2017-03-13 MED ORDER — DOXYCYCLINE HYCLATE 100 MG PO CAPS: ORAL_CAPSULE | ORAL | 0 refills | Status: DC

## 2017-03-13 MED ORDER — PROMETHAZINE-DM 6.25-15 MG/5ML PO SYRP: 5.0000 mL | ORAL_SOLUTION | Freq: Four times a day (QID) | ORAL | 1 refills | Status: DC | PRN

## 2017-03-13 NOTE — Progress Notes (Signed)
Assessment and Plan:  Nancy Norris was seen today for uri.  Diagnoses and all orders for this visit:  COPD with acute exacerbation ([place]) ? Pneumonia will obtain CXR and treat, given strict instructions to hydrate, have family member check on her throughout the day regularly, present to ER for any worsening of symptoms, will do close follow up in 48 hours.  -     CBC with Differential/Platelet -     BASIC METABOLIC PANEL WITH GFR -     DG Chest 2 View; Future -     predniSONE (DELTASONE) 20 MG tablet; 2 tablets daily for 3 days, 1 tablet daily for 4 days.       -     doxycycline (VIBRAMYCIN) 100 MG capsule; Take 1 capsule 2 x/day with food for 10 days -     promethazine-dextromethorphan (PROMETHAZINE-DM) 6.25-15 MG/5ML syrup; Take 5 mLs by mouth 4 (four) times daily as needed for cough. -     ipratropium-albuterol (DUONEB) 0.5-2.5 (3) MG/3ML nebulizer solution 3 mL  Chronic diastolic heart failure ([place])       -     DG Chest 2 View; Future  CKD stage 5, GFR less than 15 ml/min (HCC)       -     BASIC METABOLIC PANEL WITH GFR  Further disposition pending results of labs. Discussed med's effects and SE's.   Over 30 minutes of exam, counseling, chart review, and critical decision making was performed.   Future Appointments  Date Time Provider [place]  03/15/2017 11:00 AM Liane Comber, NP [facility] None  03/18/2017 11:30 AM [facility] None  03/25/2017  1:30 PM Curt Bears, MD [facility] None  04/18/2017  9:00 AM Comer, Okey Regal, MD [facility]    ------------------------------------------------------------------------------------------------------------------   HPI BP (!) 98/50   Pulse 62   Temp (!) 97.5 F (36.4 C)   Ht 5' 5.5" (1.664 m)   Wt 169 lb (76.7 kg)   SpO2 96%   BMI 27.70 kg/m   77 y.o.female presents for headache, "bronchial" cough, temp 102.8  which began 1 week ago, took some tylenol and "slept till Saturday." Endorses  fatigue/malaise, dyspnea. She reports symptoms were improving but became worse yesterday. She endorses headache/chest/back achiness with cough, and ongoing fatigue. She reports cough is productive of thick mucus. She speaks in complete sentences today. RR 20. O2 96% on RA. Reactive junky cough with deep breaths. No pursed lip breathing.   She has a history of Diastolic HF (ECHO 1/54/0086 LV EF 55-60%, mild mitral regurge, septal hypertrophy),  denies edema. Positive for dyspnea, fatigue and orthopnea. Weights stable at home but reports she has not been eating/drinking much.  Wt Readings from Last 3 Encounters:  03/13/17 169 lb (76.7 kg)  01/24/17 178 lb 9.6 oz (81 kg)  10/18/16 170 lb (77.1 kg)   She has hx breast cancer,- also currently on hiatus for chemotherapy for multiple myeloma not in remission, followed by Dr. Earlie Server. Port in place in R upper chest without apparent complication. She also has stage V CKD followed by nephrology; has fistula in left antecubital area/upper arm; intact without apparent complications but she reports she has not started dialysis and does not intend to do so. Denies SI, but reports she does not wish any further "life support" or prolonging measures. She is treated by celexa 40 mg daily.   Past Medical History:  Diagnosis Date  . Anemia   . Anginal pain ([place])  used NTG x 2 May 31 and 06/15/13   . Anxiety   . Arthritis   . B12 deficiency 12/04/2014  . Breast cancer ([place])   . CKD (chronic kidney disease) stage 3, GFR 30-59 ml/min (HCC)   . Complication of anesthesia   . COPD (chronic obstructive pulmonary disease) ([place])   . Cryptococcal pneumonitis ([place]) 11/22/2015  . Depression   . Dizziness   . Dyspnea   . Fibromyalgia   . Fibromyalgia   . GERD (gastroesophageal reflux disease)   . Headache(784.0)   . Heart murmur   . Hemoptysis 10/21/2015  . History of blood transfusion    last one May 12   . Hx of cardiovascular stress test    LexiScan with low  level exercise Myoview (02/2013): No ischemia, EF 72%; normal study  . Hx of echocardiogram    a.  Echocardiogram (12/26/2012): EF 09-32%, grade 1 diastolic dysfunction;   b.  Echocardiogram (02/2013): EF 55-60%, no WMA, trivial effusion  . Hyperkalemia   . Hyponatremia   . Hypothyroidism   . Mucositis   . Multiple myeloma   . Myocardial infarction [facility])    in past, patient was unaware.   . Neuropathy   . Pain in joint, pelvic region and thigh 07/07/2015  . PONV (postoperative nausea and vomiting) 2008   after mastestomy     Allergies  Allergen Reactions  . Codeine Anaphylaxis  . Latex Shortness Of Breath    Adhesive products   . Other Other (See Comments)    Onion, chocolate causes migraines  . Onion Other (See Comments)    Causes migraine headaches  . Zyprexa [Olanzapine] Other (See Comments)    Confusion , dizzy,unsteady  . Adhesive [Tape] Other (See Comments)    blisters  . Hydrocodone Rash    With extreme itching  . Iodinated Diagnostic Agents Hives, Itching and Rash    Happened 60 years ago Stage IV kidney function  . Sulfa Antibiotics Itching    Current Outpatient Medications on File Prior to Visit  Medication Sig  . acetaminophen (TYLENOL) 325 MG tablet Take 650 mg by mouth every 6 (six) hours as needed for mild pain or moderate pain.   Marland Kitchen albuterol (PROVENTIL HFA;VENTOLIN HFA) 108 (90 Base) MCG/ACT inhaler Inhale 1-2 puffs into the lungs every 6 (six) hours as needed for wheezing or shortness of breath (cough).  . butalbital-acetaminophen-caffeine (FIORICET, ESGIC) 50-325-40 MG tablet Take 1 tablet by mouth every 4 (four) hours as needed.  . Cholecalciferol 4000 UNITS TABS Take 4,000 Units by mouth daily.  . citalopram (CELEXA) 40 MG tablet TAKE 1 TABLET BY MOUTH DAILY OR AS DIRECTED  . cromolyn (OPTICROM) 4 % ophthalmic solution Place 1 drop into both eyes 4 (four) times daily.  Marland Kitchen dexamethasone (DECADRON) 4 MG tablet Take 1 tablet (4 mg total) by mouth daily. Take  10 tablets by mouth once a week as directed.  . furosemide (LASIX) 40 MG tablet TAKE 1 TABLET BY MOUTH DAILY. (Patient taking differently: TAKE 1 TABLET AS NEEDED)  . gabapentin (NEURONTIN) 300 MG capsule TAKE 1 CAPSULE BY MOUTH AT BEDTIME.  Marland Kitchen levothyroxine (SYNTHROID, LEVOTHROID) 150 MCG tablet Take 1 tablet daily - need office visit before further refills  . loperamide (IMODIUM) 2 MG capsule Take 1 capsule (2 mg total) by mouth as needed for diarrhea or loose stools.  . magic mouthwash SOLN Take 5 mLs by mouth 4 (four) times daily as needed for mouth pain.  . montelukast (SINGULAIR) 10  MG tablet TAKE 1 TABLET BY MOUTH DAILY.  . nitroGLYCERIN (NITROSTAT) 0.4 MG SL tablet Place 0.4 mg under the tongue every 5 (five) minutes as needed for chest pain. Reported on 03/30/2015  . nystatin (MYCOSTATIN/NYSTOP) powder Apply topically 2 (two) times daily.  . pilocarpine (SALAGEN) 5 MG tablet TAKE 1 TABLET BY MOUTH 3 TIMES DAILY FOR DRY MOUTH.  Marland Kitchen PRESCRIPTION MEDICATION Pt gets [facility] at [facility].  . ranitidine (ZANTAC) 300 MG tablet TAKE 1 TO 2 TABLETS DAILY FOR HEARTBURN & REFLUX  . senna (SENOKOT) 8.6 MG tablet Take 1 tablet by mouth at bedtime.  . traMADol (ULTRAM) 50 MG tablet Take 1 to 2 tablets by mouth 4 times a day as needed for pain.  . vitamin C (ASCORBIC ACID) 250 MG tablet Take 250 mg by mouth daily.  Marland Kitchen amphetamine-dextroamphetamine (ADDERALL) 20 MG tablet Take 1/2 to 1 tablet 1 or 2 x / day for focusing/concentration & Depression  . azelastine (ASTELIN) 0.1 % nasal spray Place 2 sprays into both nostrils 2 (two) times daily. Use in each nostril as directed  . ferrous sulfate 325 (65 FE) MG tablet Take 1 tablet daily or as directed  . fluconazole (DIFLUCAN) 200 MG tablet Take 1 tablet (200 mg total) by mouth daily. (Patient not taking: Reported on 03/13/2017)   Current Facility-Administered Medications on File Prior to Visit  Medication  . cefTRIAXone (ROCEPHIN) 1 g in dextrose 5 % 50 mL IVPB     ROS: all negative except above.   Physical Exam:  BP (!) 98/50   Pulse 62   Temp (!) 97.5 F (36.4 C)   Ht 5' 5.5" (1.664 m)   Wt 169 lb (76.7 kg)   SpO2 96%   BMI 27.70 kg/m   General Appearance: Well nourished, appears unwell though in no acute distress. Eyes: PERRLA, EOMs, conjunctiva no swelling or erythema Sinuses: No Frontal/maxillary tenderness ENT/Mouth: Ext aud canals clear, TMs without erythema, bulging. No erythema, swelling, or exudate on post pharynx.  Tonsils not swollen or erythematous. Hearing normal.  Neck: Supple, thyroid normal.  Respiratory: Respiratory effort somewhat increased, reactive wet cough with deep breaths, no pursed lip breathing, accessory muscle use, BS present throughout with scattered rales, rhonchi and wheezing throughout without stridor. Poor clearance with cough. Wheezing improved post neb.  Cardio: RRR with no audible MRGs. Symmetrical palpable peripheral pulses without edema.  Abdomen: Soft, + BS.  Non tender, no guarding, rebound, hernias, masses. Lymphatics: Non tender without lymphadenopathy.  Musculoskeletal: slow normal gait.  Skin: Warm, dry without rashes, lesions, ecchymosis.  Neuro: Cranial nerves intact. Normal muscle tone, no cerebellar symptoms.  Psych: Awake and oriented X 3, normal affect, Insight and Judgment appropriate.     Izora Ribas, NP 12:52 PM [facility]

## 2017-03-13 NOTE — Patient Instructions (Signed)
  We are treating you for pneumonia/COPD exacerbation - please make sure you are drinking plenty of fluids, have a family member check on you regularly throughout the day, follow up in 48 hours. Go to the ER if you are getting worse and not better as you are very high risk for complications.    Chronic Obstructive Pulmonary Disease Exacerbation Chronic obstructive pulmonary disease (COPD) is a common lung problem. In COPD, the flow of air from the lungs is limited. COPD exacerbations are times that breathing gets worse and you need extra treatment. Without treatment they can be life threatening. If they happen often, your lungs can become more damaged. If your COPD gets worse, your doctor may treat you with:  Medicines.  Oxygen.  Different ways to clear your airway, such as using a mask.  Follow these instructions at home:  Do not smoke.  Avoid tobacco smoke and other things that bother your lungs.  If given, take your antibiotic medicine as told. Finish the medicine even if you start to feel better.  Only take medicines as told by your doctor.  Drink enough fluids to keep your pee (urine) clear or pale yellow (unless your doctor has told you not to).  Use a cool mist machine (vaporizer).  If you use oxygen or a machine that turns liquid medicine into a mist (nebulizer), continue to use them as told.  Keep up with shots (vaccinations) as told by your doctor.  Exercise regularly.  Eat healthy foods.  Keep all doctor visits as told. Get help right away if:  You are very short of breath and it gets worse.  You have trouble talking.  You have bad chest pain.  You have blood in your spit (sputum).  You have a fever.  You keep throwing up (vomiting).  You feel weak, or you pass out (faint).  You feel confused.  You keep getting worse. This information is not intended to replace advice given to you by your health care provider. Make sure you discuss any questions you  have with your health care provider. Document Released: 12/21/2010 Document Revised: 06/09/2015 Document Reviewed: 09/05/2012 Elsevier Interactive Patient Education  2017 [employer].

## 2017-03-13 NOTE — Addendum Note (Signed)
Addended by: Izora Ribas on: 03/13/2017 01:49 PM   Modules accepted: Orders

## 2017-03-14 LAB — CBC WITH DIFFERENTIAL/PLATELET
BASOS ABS: 9 {cells}/uL (ref 0–200)
Basophils Relative: 0.2 %
EOS PCT: 0.4 %
Eosinophils Absolute: 18 cells/uL (ref 15–500)
HEMATOCRIT: 34.2 % — AB (ref 35.0–45.0)
HEMOGLOBIN: 11.7 g/dL (ref 11.7–15.5)
LYMPHS ABS: 1546 {cells}/uL (ref 850–3900)
MCH: 36.3 pg — AB (ref 27.0–33.0)
MCHC: 34.2 g/dL (ref 32.0–36.0)
MCV: 106.2 fL — ABNORMAL HIGH (ref 80.0–100.0)
MPV: 12 fL (ref 7.5–12.5)
Monocytes Relative: 8.2 %
NEUTROS PCT: 57.6 %
Neutro Abs: 2650 cells/uL (ref 1500–7800)
Platelets: 56 10*3/uL — ABNORMAL LOW (ref 140–400)
RBC: 3.22 10*6/uL — AB (ref 3.80–5.10)
RDW: 13.6 % (ref 11.0–15.0)
TOTAL LYMPHOCYTE: 33.6 %
WBC mixed population: 377 cells/uL (ref 200–950)
WBC: 4.6 10*3/uL (ref 3.8–10.8)

## 2017-03-14 LAB — BASIC METABOLIC PANEL WITH GFR
BUN / CREAT RATIO: 9 (calc) (ref 6–22)
BUN: 30 mg/dL — ABNORMAL HIGH (ref 7–25)
CO2: 26 mmol/L (ref 20–32)
CREATININE: 3.39 mg/dL — AB (ref 0.60–0.93)
Calcium: 9.5 mg/dL (ref 8.6–10.4)
Chloride: 108 mmol/L (ref 98–110)
GFR, EST NON AFRICAN AMERICAN: 13 mL/min/{1.73_m2} — AB (ref >=60)
GFR, Est African American: 15 mL/min/{1.73_m2} — ABNORMAL LOW (ref >=60)
GLUCOSE: 126 mg/dL — AB (ref 65–99)
POTASSIUM: 4.6 mmol/L (ref 3.5–5.3)
SODIUM: 144 mmol/L (ref 135–146)

## 2017-03-14 LAB — BRAIN NATRIURETIC PEPTIDE: Brain Natriuretic Peptide: 77 pg/mL (ref <100)

## 2017-03-14 NOTE — Progress Notes (Deleted)
MEDICARE ANNUAL WELLNESS VISIT AND FOLLOW UP  Assessment:   Diagnoses and all orders for this visit:  Encounter for Medicare annual wellness exam  Chronic diastolic heart failure ([place])  Essential hypertension  Chronic asthma without complication, unspecified asthma severity, unspecified whether persistent  Pulmonary emphysema, unspecified emphysema type ([place])  Cryptococcal pneumonitis ([place])  Pneumonia of left lower lobe due to infectious organism ([place])  Gastroesophageal reflux disease, esophagitis presence not specified  Hypothyroidism, unspecified type  CKD (chronic kidney disease) stage 5, GFR less than 15 ml/min (HCC)  Vaginal yeast infection  Anemia in neoplastic disease  B12 deficiency  Depression, controlled  HX: breast cancer  Lung mass  Medication management  Mixed hyperlipidemia  Multiple myeloma not having achieved remission ([place])  Nodule of left lung  Port catheter in place  Prediabetes  Thrombocytopenia ([place])  Vitamin D deficiency     Over 40 minutes of exam, counseling, chart review and critical decision making was performed Future Appointments  Date Time Provider [place]  03/15/2017 11:00 AM Liane Comber, NP [facility] None  03/18/2017 11:30 AM [facility] None  03/25/2017  1:30 PM Curt Bears, MD [facility] None  04/18/2017  9:00 AM Comer, Okey Regal, MD [facility]     Plan:   During the course of the visit the patient was educated and counseled about appropriate screening and preventive services including:    Pneumococcal vaccine   Prevnar 13  Influenza vaccine  Td vaccine  Screening electrocardiogram  Bone densitometry screening  Colorectal cancer screening  Diabetes screening  Glaucoma screening  Nutrition counseling   Advanced directives: requested   Subjective:  Nancy Norris is a 77 y.o. female who presents for 43 close follow up for presumptive pneumonia and  Medicare Annual Wellness Visit. She has hx of breast cancer, multiple myeloma not in remission for which she is receiving chemotherapy managed by Dr. Earlie Server, currently on chemo break.       Patient is in stage V CKD; has fistula of L upper arm without access for dialysis to date. Managed by Dr. Linus Salmons at [facility]. Last GFR: Lab Results  Component Value Date   GFRNONAA 13 (L) 03/13/2017    Medication Review: Current Outpatient Medications on File Prior to Visit  Medication Sig Dispense Refill  . acetaminophen (TYLENOL) 325 MG tablet Take 650 mg by mouth every 6 (six) hours as needed for mild pain or moderate pain.     Marland Kitchen albuterol (PROVENTIL HFA;VENTOLIN HFA) 108 (90 Base) MCG/ACT inhaler Inhale 1-2 puffs into the lungs every 6 (six) hours as needed for wheezing or shortness of breath (cough). 1 Inhaler 3  . amphetamine-dextroamphetamine (ADDERALL) 20 MG tablet Take 1/2 to 1 tablet 1 or 2 x / day for focusing/concentration & Depression 60 tablet 0  . azelastine (ASTELIN) 0.1 % nasal spray Place 2 sprays into both nostrils 2 (two) times daily. Use in each nostril as directed 30 mL 2  . butalbital-acetaminophen-caffeine (FIORICET, ESGIC) 50-325-40 MG tablet Take 1 tablet by mouth every 4 (four) hours as needed.  3  . Cholecalciferol 4000 UNITS TABS Take 4,000 Units by mouth daily.    . citalopram (CELEXA) 40 MG tablet TAKE 1 TABLET BY MOUTH DAILY OR AS DIRECTED 90 tablet 0  . cromolyn (OPTICROM) 4 % ophthalmic solution Place 1 drop into both eyes 4 (four) times daily.    Marland Kitchen dexamethasone (DECADRON) 4 MG tablet Take 1 tablet (4 mg total) by mouth daily. Take 10 tablets by  mouth once a week as directed. 30 tablet 1  . doxycycline (VIBRAMYCIN) 100 MG capsule Take 1 capsule 2 x/day with food for 10 days 20 capsule 0  . ferrous sulfate 325 (65 FE) MG tablet Take 1 tablet daily or as directed 90 tablet 1  . fluconazole (DIFLUCAN) 200 MG tablet Take 1 tablet (200 mg total) by mouth daily.  (Patient not taking: Reported on 03/13/2017) 30 tablet 0  . furosemide (LASIX) 40 MG tablet TAKE 1 TABLET BY MOUTH DAILY. (Patient taking differently: TAKE 1 TABLET AS NEEDED) 90 tablet 1  . gabapentin (NEURONTIN) 300 MG capsule TAKE 1 CAPSULE BY MOUTH AT BEDTIME. 90 capsule 3  . levothyroxine (SYNTHROID, LEVOTHROID) 150 MCG tablet Take 1 tablet daily - need office visit before further refills 30 tablet 0  . loperamide (IMODIUM) 2 MG capsule Take 1 capsule (2 mg total) by mouth as needed for diarrhea or loose stools. 30 capsule 2  . magic mouthwash SOLN Take 5 mLs by mouth 4 (four) times daily as needed for mouth pain. 240 mL 1  . montelukast (SINGULAIR) 10 MG tablet TAKE 1 TABLET BY MOUTH DAILY. 90 tablet 1  . nitroGLYCERIN (NITROSTAT) 0.4 MG SL tablet Place 0.4 mg under the tongue every 5 (five) minutes as needed for chest pain. Reported on 03/30/2015    . nystatin (MYCOSTATIN/NYSTOP) powder Apply topically 2 (two) times daily. 30 g 0  . pilocarpine (SALAGEN) 5 MG tablet TAKE 1 TABLET BY MOUTH 3 TIMES DAILY FOR DRY MOUTH. 270 tablet 1  . predniSONE (DELTASONE) 20 MG tablet 2 tablets daily for 3 days, 1 tablet daily for 4 days. 10 tablet 0  . PRESCRIPTION MEDICATION Pt gets chemo at [facility].    Marland Kitchen promethazine-dextromethorphan (PROMETHAZINE-DM) 6.25-15 MG/5ML syrup Take 5 mLs by mouth 4 (four) times daily as needed for cough. 240 mL 1  . ranitidine (ZANTAC) 300 MG tablet TAKE 1 TO 2 TABLETS DAILY FOR HEARTBURN & REFLUX 180 tablet 0  . senna (SENOKOT) 8.6 MG tablet Take 1 tablet by mouth at bedtime.    . traMADol (ULTRAM) 50 MG tablet Take 1 to 2 tablets by mouth 4 times a day as needed for pain. 180 tablet 0  . vitamin C (ASCORBIC ACID) 250 MG tablet Take 250 mg by mouth daily.     Current Facility-Administered Medications on File Prior to Visit  Medication Dose Route Frequency Provider Last Rate Last Dose  . cefTRIAXone (ROCEPHIN) 1 g in dextrose 5 % 50 mL IVPB  1 g Intravenous Q24H Mikey Bussing R,  NP   1 g at 09/19/16 1019  . ipratropium-albuterol (DUONEB) 0.5-2.5 (3) MG/3ML nebulizer solution 3 mL  3 mL Nebulization Once Liane Comber, NP        Allergies  Allergen Reactions  . Codeine Anaphylaxis  . Latex Shortness Of Breath    Adhesive products   . Other Other (See Comments)    Onion, chocolate causes migraines  . Onion Other (See Comments)    Causes migraine headaches  . Zyprexa [Olanzapine] Other (See Comments)    Confusion , dizzy,unsteady  . Adhesive [Tape] Other (See Comments)    blisters  . Hydrocodone Rash    With extreme itching  . Iodinated Diagnostic Agents Hives, Itching and Rash    Happened 60 years ago Stage IV kidney function  . Sulfa Antibiotics Itching    Current Problems (verified) Patient Active Problem List   Diagnosis Date Noted  . Nausea with vomiting 09/19/2016  .  Port catheter in place 09/18/2016  . Vaginal yeast infection 01/20/2016  . Cryptococcal pneumonitis ([place]) 11/22/2015  . Lung mass 09/28/2015  . Nodule of left lung 09/13/2015  . Pneumonia 08/23/2015  . Fever 07/13/2015  . Multiple myeloma not having achieved remission ([place]) 05/19/2015  . Anemia in neoplastic disease 03/31/2015  . B12 deficiency 12/04/2014  . Thrombocytopenia ([place]) 12/04/2014  . Encounter for antineoplastic chemotherapy 06/21/2014  . CKD (chronic kidney disease) stage 5, GFR less than 15 ml/min (HCC) 04/02/2014  . Multiple myeloma ([place]) 12/02/2013  . Depression, controlled 11/02/2013  . Medication management 09/30/2013  . Vitamin D deficiency 03/24/2013  . Mixed hyperlipidemia 02/24/2013  . Prediabetes 02/24/2013  . COPD (chronic obstructive pulmonary disease) with emphysema ([place]) 02/20/2013  . GERD (gastroesophageal reflux disease) 01/20/2013  . HTN (hypertension) 01/14/2013  . Asthma, chronic 01/13/2013  . Hypothyroid 01/13/2013  . Chronic diastolic heart failure ([place]) 01/13/2013  . HX: breast cancer 12/08/2012  . Hypercalcemia 12/02/2012     Screening Tests Immunization History  Administered Date(s) Administered  . Influenza, High Dose Seasonal PF 09/30/2013  . Influenza,inj,Quad PF,6+ Mos 09/29/2015  . Influenza-Unspecified 11/05/2012, 11/03/2014, 10/05/2016  . Pneumococcal Conjugate-13 10/05/2013  . Pneumococcal Polysaccharide-23 03/29/2009  . Pneumococcal-Unspecified 03/16/2011  . Tdap 10/14/2009   Preventative care: Last colonoscopy: 2011 Last mammogram: 4/18 DEXA:2016  Prior vaccinations: TD or Tdap: 2011  Influenza: 2018  Pneumococcal: 2013 Prevnar13: 2015 Shingles/Zostavax: Not a candidate secondary to chemotherapy  Names of Other Physician/Practitioners you currently use: 1. [name]- here for primary care 2. Dr. Delman Cheadle, eye doctor, last visit 2017 3. Does not see , dentist, last visit 2015  Patient Care Team: [name], MD as PCP - General (Internal Medicine) Donato Heinz, MD as Referring Physician (Nephrology) Curt Bears, MD as Consulting Physician (Oncology) Wonda Horner, MD as Consulting Physician (Gastroenterology) Sharyne Peach, MD as Consulting Physician (Ophthalmology)  SURGICAL HISTORY She  has a past surgical history that includes history of Port removal; Status post stem cell transplant on September 28, 2008.; Abdominal hysterectomy (1981); Cholecystectomy (1971); Mastectomy (Left, 2008); Cataract extraction, bilateral; Breast surgery; Eye surgery (Bilateral); Breast reconstruction; Portacath placement (12/2012); AV fistula placement (Left, 06/19/2013); and Lung biopsy (10/21/2015). FAMILY HISTORY Her family history includes Arthritis in her mother; Asthma in her mother; Cancer in her sister; Hyperlipidemia in her brother. SOCIAL HISTORY She  reports that she quit smoking about 12 years ago. Her smoking use included cigarettes. She has a 30.00 pack-year smoking history. she has never used smokeless tobacco. She reports that she  does not drink alcohol or use drugs.   MEDICARE WELLNESS OBJECTIVES: Physical activity:   Cardiac risk factors:   Depression/mood screen:   Depression screen PHQ 2/9 10/18/2016  Decreased Interest 2  Down, Depressed, Hopeless 2  PHQ - 2 Score 4  Altered sleeping 1  Tired, decreased energy 1  Change in appetite 3  Feeling bad or failure about yourself  0  Trouble concentrating 0  Moving slowly or fidgety/restless 0  Suicidal thoughts 0  PHQ-9 Score 9  Difficult doing work/chores Somewhat difficult  Some recent data might be hidden    ADLs:  In your present state of health, do you have any difficulty performing the following activities: 10/18/2016 07/16/2016  Hearing? N N  Vision? N N  Difficulty concentrating or making decisions? N N  Walking or climbing stairs? N N  Dressing or bathing? N N  Doing errands, shopping? N N  Some recent  data might be hidden     Cognitive Testing  Alert? Yes  Normal Appearance?Yes  Oriented to person? Yes  Place? Yes   Time? Yes  Recall of three objects?  Yes  Can perform simple calculations? Yes  Displays appropriate judgment?Yes  Can read the correct time from a watch face?Yes  EOL planning:    ROS   Objective:     There were no vitals filed for this visit. There is no height or weight on file to calculate BMI.  General appearance: alert, no distress, WD/WN, female HEENT: normocephalic, sclerae anicteric, TMs pearly, nares patent, no discharge or erythema, pharynx normal Oral cavity: MMM, no lesions Neck: supple, no lymphadenopathy, no thyromegaly, no masses Heart: RRR, normal S1, S2, no murmurs Lungs: CTA bilaterally, no wheezes, rhonchi, or rales Abdomen: +bs, soft, non tender, non distended, no masses, no hepatomegaly, no splenomegaly Musculoskeletal: nontender, no swelling, no obvious deformity Extremities: no edema, no cyanosis, no clubbing Pulses: 2+ symmetric, upper and lower extremities, normal cap refill Neurological:  alert, oriented x 3, CN2-12 intact, strength normal upper extremities and lower extremities, sensation normal throughout, DTRs 2+ throughout, no cerebellar signs, gait normal Psychiatric: normal affect, behavior normal, pleasant   Medicare Attestation I have personally reviewed: The patient's medical and social history Their use of alcohol, tobacco or illicit drugs Their current medications and supplements The patient's functional ability including ADLs,fall risks, home safety risks, cognitive, and hearing and visual impairment Diet and physical activities Evidence for depression or mood disorders  The patient's weight, height, BMI, and visual acuity have been recorded in the chart.  I have made referrals, counseling, and provided education to the patient based on review of the above and I have provided the patient with a written personalized care plan for preventive services.     Izora Ribas, NP   03/14/2017

## 2017-03-15 ENCOUNTER — Other Ambulatory Visit: Payer: Self-pay | Admitting: Adult Health

## 2017-03-15 ENCOUNTER — Encounter (HOSPITAL_COMMUNITY): Payer: Self-pay

## 2017-03-15 ENCOUNTER — Telehealth: Payer: Self-pay

## 2017-03-15 ENCOUNTER — Inpatient Hospital Stay (HOSPITAL_COMMUNITY)
Admission: EM | Admit: 2017-03-15 | Discharge: 2017-03-19 | DRG: 194 | Disposition: A | Discharge status: Home / Self Care | Payer: Medicare Other | Attending: Internal Medicine | Admitting: Internal Medicine

## 2017-03-15 ENCOUNTER — Emergency Department (HOSPITAL_COMMUNITY): Payer: Medicare Other

## 2017-03-15 ENCOUNTER — Other Ambulatory Visit: Payer: Self-pay

## 2017-03-15 ENCOUNTER — Ambulatory Visit: Payer: Self-pay | Admitting: Adult Health

## 2017-03-15 DIAGNOSIS — N183 Chronic kidney disease, stage 3 (moderate): Secondary | ICD-10-CM | POA: Diagnosis present

## 2017-03-15 DIAGNOSIS — E870 Hyperosmolality and hypernatremia: Secondary | ICD-10-CM | POA: Diagnosis present

## 2017-03-15 DIAGNOSIS — C9000 Multiple myeloma not having achieved remission: Secondary | ICD-10-CM | POA: Diagnosis not present

## 2017-03-15 DIAGNOSIS — Z888 Allergy status to other drugs, medicaments and biological substances status: Secondary | ICD-10-CM

## 2017-03-15 DIAGNOSIS — D61818 Other pancytopenia: Secondary | ICD-10-CM | POA: Diagnosis present

## 2017-03-15 DIAGNOSIS — Z8261 Family history of arthritis: Secondary | ICD-10-CM

## 2017-03-15 DIAGNOSIS — M797 Fibromyalgia: Secondary | ICD-10-CM | POA: Diagnosis present

## 2017-03-15 DIAGNOSIS — R0602 Shortness of breath: Secondary | ICD-10-CM | POA: Diagnosis not present

## 2017-03-15 DIAGNOSIS — Z87891 Personal history of nicotine dependence: Secondary | ICD-10-CM

## 2017-03-15 DIAGNOSIS — R509 Fever, unspecified: Secondary | ICD-10-CM | POA: Diagnosis not present

## 2017-03-15 DIAGNOSIS — J159 Unspecified bacterial pneumonia: Secondary | ICD-10-CM | POA: Diagnosis present

## 2017-03-15 DIAGNOSIS — K449 Diaphragmatic hernia without obstruction or gangrene: Secondary | ICD-10-CM | POA: Diagnosis present

## 2017-03-15 DIAGNOSIS — A419 Sepsis, unspecified organism: Secondary | ICD-10-CM | POA: Diagnosis not present

## 2017-03-15 DIAGNOSIS — Z9841 Cataract extraction status, right eye: Secondary | ICD-10-CM

## 2017-03-15 DIAGNOSIS — F419 Anxiety disorder, unspecified: Secondary | ICD-10-CM | POA: Diagnosis present

## 2017-03-15 DIAGNOSIS — J111 Influenza due to unidentified influenza virus with other respiratory manifestations: Secondary | ICD-10-CM | POA: Diagnosis present

## 2017-03-15 DIAGNOSIS — N179 Acute kidney failure, unspecified: Secondary | ICD-10-CM | POA: Diagnosis present

## 2017-03-15 DIAGNOSIS — J189 Pneumonia, unspecified organism: Secondary | ICD-10-CM | POA: Diagnosis not present

## 2017-03-15 DIAGNOSIS — J1008 Influenza due to other identified influenza virus with other specified pneumonia: Principal | ICD-10-CM | POA: Diagnosis present

## 2017-03-15 DIAGNOSIS — R05 Cough: Secondary | ICD-10-CM | POA: Diagnosis not present

## 2017-03-15 DIAGNOSIS — J432 Centrilobular emphysema: Secondary | ICD-10-CM | POA: Diagnosis present

## 2017-03-15 DIAGNOSIS — I252 Old myocardial infarction: Secondary | ICD-10-CM

## 2017-03-15 DIAGNOSIS — I2510 Atherosclerotic heart disease of native coronary artery without angina pectoris: Secondary | ICD-10-CM | POA: Diagnosis present

## 2017-03-15 DIAGNOSIS — E039 Hypothyroidism, unspecified: Secondary | ICD-10-CM | POA: Diagnosis present

## 2017-03-15 DIAGNOSIS — Z853 Personal history of malignant neoplasm of breast: Secondary | ICD-10-CM

## 2017-03-15 DIAGNOSIS — R059 Cough, unspecified: Secondary | ICD-10-CM

## 2017-03-15 DIAGNOSIS — K219 Gastro-esophageal reflux disease without esophagitis: Secondary | ICD-10-CM | POA: Diagnosis present

## 2017-03-15 DIAGNOSIS — J101 Influenza due to other identified influenza virus with other respiratory manifestations: Secondary | ICD-10-CM

## 2017-03-15 DIAGNOSIS — Z9842 Cataract extraction status, left eye: Secondary | ICD-10-CM

## 2017-03-15 DIAGNOSIS — Z66 Do not resuscitate: Secondary | ICD-10-CM | POA: Diagnosis present

## 2017-03-15 DIAGNOSIS — Z9049 Acquired absence of other specified parts of digestive tract: Secondary | ICD-10-CM

## 2017-03-15 DIAGNOSIS — Z91040 Latex allergy status: Secondary | ICD-10-CM

## 2017-03-15 DIAGNOSIS — Z91048 Other nonmedicinal substance allergy status: Secondary | ICD-10-CM

## 2017-03-15 DIAGNOSIS — Z882 Allergy status to sulfonamides status: Secondary | ICD-10-CM

## 2017-03-15 DIAGNOSIS — Z91018 Allergy to other foods: Secondary | ICD-10-CM

## 2017-03-15 DIAGNOSIS — Z9484 Stem cells transplant status: Secondary | ICD-10-CM

## 2017-03-15 DIAGNOSIS — E860 Dehydration: Secondary | ICD-10-CM | POA: Diagnosis present

## 2017-03-15 DIAGNOSIS — Z825 Family history of asthma and other chronic lower respiratory diseases: Secondary | ICD-10-CM

## 2017-03-15 DIAGNOSIS — Z79890 Hormone replacement therapy (postmenopausal): Secondary | ICD-10-CM

## 2017-03-15 DIAGNOSIS — Z79891 Long term (current) use of opiate analgesic: Secondary | ICD-10-CM

## 2017-03-15 DIAGNOSIS — Z8349 Family history of other endocrine, nutritional and metabolic diseases: Secondary | ICD-10-CM

## 2017-03-15 DIAGNOSIS — Z79899 Other long term (current) drug therapy: Secondary | ICD-10-CM

## 2017-03-15 DIAGNOSIS — R531 Weakness: Secondary | ICD-10-CM | POA: Diagnosis not present

## 2017-03-15 DIAGNOSIS — Z90710 Acquired absence of both cervix and uterus: Secondary | ICD-10-CM

## 2017-03-15 DIAGNOSIS — Z9012 Acquired absence of left breast and nipple: Secondary | ICD-10-CM

## 2017-03-15 DIAGNOSIS — J441 Chronic obstructive pulmonary disease with (acute) exacerbation: Secondary | ICD-10-CM

## 2017-03-15 DIAGNOSIS — Z885 Allergy status to narcotic agent status: Secondary | ICD-10-CM

## 2017-03-15 LAB — CBC WITH DIFFERENTIAL/PLATELET
BASOS PCT: 0 %
Basophils Absolute: 0 10*3/uL (ref 0.0–0.1)
EOS PCT: 0 %
Eosinophils Absolute: 0 10*3/uL (ref 0.0–0.7)
HEMATOCRIT: 32.2 % — AB (ref 36.0–46.0)
Hemoglobin: 10.6 g/dL — ABNORMAL LOW (ref 12.0–15.0)
Lymphocytes Relative: 12 %
Lymphs Abs: 0.3 10*3/uL — ABNORMAL LOW (ref 0.7–4.0)
MCH: 36.9 pg — AB (ref 26.0–34.0)
MCHC: 32.9 g/dL (ref 30.0–36.0)
MCV: 112.2 fL — AB (ref 78.0–100.0)
MONO ABS: 0.1 10*3/uL (ref 0.1–1.0)
MONOS PCT: 4 %
NEUTROS PCT: 84 %
Neutro Abs: 2.1 10*3/uL (ref 1.7–7.7)
PLATELETS: 85 10*3/uL — AB (ref 150–400)
RBC: 2.87 MIL/uL — AB (ref 3.87–5.11)
RDW: 14.2 % (ref 11.5–15.5)
WBC: 2.5 10*3/uL — AB (ref 4.0–10.5)

## 2017-03-15 LAB — URINALYSIS W MICROSCOPIC + REFLEX CULTURE
BACTERIA UA: NONE SEEN /HPF
BILIRUBIN URINE: NEGATIVE
Hgb urine dipstick: NEGATIVE
KETONES UR: NEGATIVE
NITRITES URINE, INITIAL: NEGATIVE
Specific Gravity, Urine: 1.02 (ref 1.001–1.03)
pH: 5.5 (ref 5.0–8.0)

## 2017-03-15 LAB — BASIC METABOLIC PANEL
ANION GAP: 12 (ref 5–15)
BUN: 34 mg/dL — AB (ref 6–20)
CO2: 21 mmol/L — AB (ref 22–32)
Calcium: 8.8 mg/dL — ABNORMAL LOW (ref 8.9–10.3)
Chloride: 108 mmol/L (ref 101–111)
Creatinine, Ser: 2.98 mg/dL — ABNORMAL HIGH (ref 0.44–1.00)
GFR calc Af Amer: 17 mL/min — ABNORMAL LOW (ref >60)
GFR calc non Af Amer: 14 mL/min — ABNORMAL LOW (ref >60)
GLUCOSE: 241 mg/dL — AB (ref 65–99)
POTASSIUM: 4.1 mmol/L (ref 3.5–5.1)
Sodium: 141 mmol/L (ref 135–145)

## 2017-03-15 LAB — URINE CULTURE
MICRO NUMBER: 90261705
SPECIMEN QUALITY: ADEQUATE

## 2017-03-15 LAB — I-STAT CG4 LACTIC ACID, ED: Lactic Acid, Venous: 3.39 mmol/L (ref 0.5–1.9)

## 2017-03-15 LAB — INFLUENZA PANEL BY PCR (TYPE A & B)
INFLAPCR: POSITIVE — AB
INFLBPCR: NEGATIVE

## 2017-03-15 LAB — CULTURE INDICATED

## 2017-03-15 MED ORDER — SODIUM CHLORIDE 0.9 % IV SOLN: Freq: Once | INTRAVENOUS | Status: DC

## 2017-03-15 MED ORDER — SODIUM CHLORIDE 0.9 % IV SOLN: 2.0000 g | Freq: Once | INTRAVENOUS | Status: DC

## 2017-03-15 MED ORDER — OSELTAMIVIR PHOSPHATE 30 MG PO CAPS
30.0000 mg | ORAL_CAPSULE | Freq: Once | ORAL | Status: AC
  Administered 2017-03-16: 30 mg via ORAL
  Filled 2017-03-15: qty 1

## 2017-03-15 MED ORDER — IPRATROPIUM BROMIDE 0.02 % IN SOLN
0.5000 mg | Freq: Once | RESPIRATORY_TRACT | Status: AC
  Administered 2017-03-15: 0.5 mg via RESPIRATORY_TRACT
  Filled 2017-03-15: qty 2.5

## 2017-03-15 MED ORDER — CEFEPIME HCL 1 G IJ SOLR
1.0000 g | Freq: Once | INTRAMUSCULAR | Status: AC
  Administered 2017-03-15: 1 g via INTRAVENOUS
  Filled 2017-03-15: qty 1

## 2017-03-15 MED ORDER — SODIUM CHLORIDE 0.9 % IV BOLUS (SEPSIS)
500.0000 mL | Freq: Once | INTRAVENOUS | Status: AC
  Administered 2017-03-15: 500 mL via INTRAVENOUS

## 2017-03-15 MED ORDER — ALBUTEROL SULFATE (2.5 MG/3ML) 0.083% IN NEBU
5.0000 mg | INHALATION_SOLUTION | Freq: Once | RESPIRATORY_TRACT | Status: AC
  Administered 2017-03-15: 5 mg via RESPIRATORY_TRACT
  Filled 2017-03-15: qty 6

## 2017-03-15 MED ORDER — SODIUM CHLORIDE 0.9 % IV SOLN
2.0000 g | Freq: Once | INTRAVENOUS | Status: DC
  Filled 2017-03-15: qty 20

## 2017-03-15 MED ORDER — BENZONATATE 100 MG PO CAPS: 200.0000 mg | ORAL_CAPSULE | Freq: Three times a day (TID) | ORAL | 0 refills | Status: DC | PRN

## 2017-03-15 MED ORDER — SODIUM CHLORIDE 0.9 % IV BOLUS (SEPSIS)
1000.0000 mL | Freq: Once | INTRAVENOUS | Status: AC
  Administered 2017-03-15: 1000 mL via INTRAVENOUS

## 2017-03-15 MED ORDER — VANCOMYCIN HCL 10 G IV SOLR
1500.0000 mg | Freq: Once | INTRAVENOUS | Status: AC
  Administered 2017-03-15: 1500 mg via INTRAVENOUS
  Filled 2017-03-15: qty 1500

## 2017-03-15 NOTE — ED Notes (Signed)
Both sets of blood cultures collected prior to antibiotic administration.

## 2017-03-15 NOTE — Telephone Encounter (Signed)
Patient never went to the ER, EMT's took vitals and said they were "OK". Patient has SOB, O2 without oxygen was 85 and with oxygen was 94. Feels that she has a fever and currently taking 500mg  of Tylenol. Patient is allergic to prescription cough medicine. Would like to know if you have any suggestions on what to take. If script is to be called in, please send it to [place].

## 2017-03-15 NOTE — ED Notes (Signed)
Lactic acid 3.39 mmol/L. Duffy Bruce, MD, and Nash Shearer, RN, notified.

## 2017-03-15 NOTE — ED Triage Notes (Addendum)
Patient reports that she was diagnosed with pneumonia 2 days ago. Patient reports that she was suppose to go back to her Dr. Today, but when she called, she was told to come to the ED for antibiotics and a breathing treatment. patient is having a productive cough with white sputum.

## 2017-03-15 NOTE — Progress Notes (Signed)
A consult was received from an ED physician for Vancomycin per pharmacy dosing.  The patient's profile has been reviewed for ht/wt/allergies/indication/available labs.   A one time order has been placed for Vancomycin 1500mg  IV.  Cefepime dose also decreased to 1gm based on renal function.  Further antibiotics/pharmacy consults should be ordered by admitting physician if indicated.                       Thank you, Biagio Borg 03/15/2017  9:27 PM

## 2017-03-15 NOTE — ED Provider Notes (Signed)
[facility] DEPT Provider Note   CSN: [id number] Arrival date & time: 03/15/17  1551     History   Chief Complaint Chief Complaint  Patient presents with  . Shortness of Breath    HPI Nancy Norris is a 77 y.o. female.  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  The patient states that for the last week and a half, she is had generalized body aches, fever, and fatigue.  Her symptoms started acutely approximately 1 week ago with acute onset of body aches, fevers, and chills.  She states she stayed in bed for approximately 3-4 days and had little to no p.o. intake.  She finally went to her doctor recently and was placed on doxycycline and prednisone.  She has had persistent worsening cough since then as well as generalized fatigue.  She has not been wanting to eat or drink.  She is had decreased urine output.  She is been taking her antibiotics as prescribed.  No abdominal pain.  No diarrhea.  No vomiting.  She describes diffuse body aches that seem to come and go.   Past Medical History:  Diagnosis Date  . Anemia   . Anginal pain ([place])    used NTG x 2 May 31 and 06/15/13   . Anxiety   . Arthritis   . B12 deficiency 12/04/2014  . Breast cancer ([place])   . CKD (chronic kidney disease) stage 3, GFR 30-59 ml/min (HCC)   . Complication of anesthesia   . COPD (chronic obstructive pulmonary disease) ([place])   . Cryptococcal pneumonitis ([place]) 11/22/2015  . Depression   . Dizziness   . Dyspnea   . Fibromyalgia   . Fibromyalgia   . GERD (gastroesophageal reflux disease)   . Headache(784.0)   . Heart murmur   . Hemoptysis 10/21/2015  . History of blood transfusion    last one May 12   . Hx of cardiovascular stress test    LexiScan with low level exercise Myoview (02/2013): No ischemia, EF  72%; normal study  . Hx of echocardiogram    a.  Echocardiogram (12/26/2012): EF 16-38%, grade 1 diastolic dysfunction;   b.  Echocardiogram (02/2013): EF 55-60%, no WMA, trivial effusion  . Hyperkalemia   . Hyponatremia   . Hypothyroidism   . Mucositis   . Multiple myeloma   . Myocardial infarction [facility])    in past, patient was unaware.   . Neuropathy   . Pain in joint, pelvic region and thigh 07/07/2015  . PONV (postoperative nausea and vomiting) 2008   after mastestomy    Patient Active Problem List   Diagnosis Date Noted  . Nausea with vomiting 09/19/2016  . Port catheter in place 09/18/2016  . Vaginal yeast infection 01/20/2016  . Cryptococcal pneumonitis ([place]) 11/22/2015  . Lung mass 09/28/2015  . Nodule of left lung 09/13/2015  . Pneumonia 08/23/2015  . Fever 07/13/2015  . Multiple myeloma not having achieved remission ([place]) 2020-09-1915  . Anemia in neoplastic disease 03/31/2015  . B12 deficiency 12/04/2014  . Thrombocytopenia ([place]) 12/04/2014  . Encounter for antineoplastic chemotherapy 06/21/2014  . CKD (chronic kidney disease) stage 5, GFR less than 15 ml/min (HCC) 04/02/2014  . Multiple myeloma ([place]) 12/02/2013  . Depression, controlled 11/02/2013  . Medication management 09/30/2013  . Vitamin D deficiency 03/24/2013  . Mixed hyperlipidemia 02/24/2013  .  Prediabetes 02/24/2013  . COPD (chronic obstructive pulmonary disease) with emphysema ([place]) 02/20/2013  . GERD (gastroesophageal reflux disease) 01/20/2013  . HTN (hypertension) 01/14/2013  . Asthma, chronic 01/13/2013  . Hypothyroid 01/13/2013  . Chronic diastolic heart failure ([place]) 01/13/2013  . HX: breast cancer 12/08/2012  . Hypercalcemia 12/02/2012    Past Surgical History:  Procedure Laterality Date  . ABDOMINAL HYSTERECTOMY  1981  . AV FISTULA PLACEMENT Left 06/19/2013   Procedure: CREATION OF LEFT ARM ARTERIOVENOUS (AV) FISTULA ;  Surgeon: Angelia Mould, MD;  Location: [place];  Service:  Vascular;  Laterality: Left;  . BREAST RECONSTRUCTION    . BREAST SURGERY    . CATARACT EXTRACTION, BILATERAL    . CHOLECYSTECTOMY  1971  . EYE SURGERY Bilateral    lens implant  . history of Port removal    . LUNG BIOPSY  10/21/2015  . MASTECTOMY Left 2008  . PORTACATH PLACEMENT  12/2012   has had 2  . Status post stem cell transplant on September 28, 2008.      OB History    No data available       Home Medications    Prior to Admission medications   Medication Sig Start Date End Date Taking? Authorizing Provider  acetaminophen (TYLENOL) 325 MG tablet Take 650 mg by mouth every 6 (six) hours as needed for mild pain or moderate pain.    Yes [provider]  albuterol (PROVENTIL HFA;VENTOLIN HFA) 108 (90 Base) MCG/ACT inhaler Inhale 1-2 puffs into the lungs every 6 (six) hours as needed for wheezing or shortness of breath (cough). 08/30/15  Yes Forcucci, Courtney, PA-C  butalbital-acetaminophen-caffeine (FIORICET, ESGIC) 50-325-40 MG tablet Take 1 tablet by mouth every 4 (four) hours as needed. 08/03/16  Yes [provider]  Cholecalciferol 4000 UNITS TABS Take 4,000 Units by mouth daily.   Yes [provider]  citalopram (CELEXA) 40 MG tablet TAKE 1 TABLET BY MOUTH DAILY OR AS DIRECTED Patient taking differently: Take 40 mg by mouth daily 11/30/16  Yes Vicie Mutters, PA-C  cromolyn (OPTICROM) 4 % ophthalmic solution Place 1 drop into both eyes 4 (four) times daily as needed (irritation).    Yes [provider]  dexamethasone (DECADRON) 4 MG tablet Take 1 tablet (4 mg total) by mouth daily. Take 10 tablets by mouth once a week as directed. 10/10/16  Yes Curt Bears, MD  doxycycline (VIBRAMYCIN) 100 MG capsule Take 1 capsule 2 x/day with food for 10 days 03/13/17  Yes Liane Comber, NP  furosemide (LASIX) 40 MG tablet TAKE 1 TABLET BY MOUTH DAILY. Patient taking differently: Take 40 mg by mouth as needed for fluid 12/03/16  Yes [name], MD  gabapentin (NEURONTIN) 300 MG capsule TAKE 1 CAPSULE BY MOUTH AT BEDTIME. 10/08/16  Yes [name], MD  levothyroxine (SYNTHROID, LEVOTHROID) 150 MCG tablet Take 1 tablet daily - need office visit before further refills 01/22/17  Yes [name], MD  loperamide (IMODIUM) 2 MG capsule Take 1 capsule (2 mg total) by mouth as needed for diarrhea or loose stools. 02/28/16  Yes [name], MD  magic mouthwash SOLN Take 5 mLs by mouth 4 (four) times daily as needed for mouth pain. 10/27/15  Yes Curt Bears, MD  montelukast (SINGULAIR) 10 MG tablet TAKE 1 TABLET BY MOUTH DAILY. 08/03/16  Yes [name], MD  nitroGLYCERIN (NITROSTAT) 0.4 MG SL tablet Place 0.4 mg under the tongue every 5 (five) minutes as needed for chest pain. Reported on  03/30/2015   Yes [provider]  nystatin (MYCOSTATIN/NYSTOP) powder Apply topically 2 (two) times daily. Patient taking differently: Apply 1 g topically 2 (two) times daily as needed (rash).  11/08/15  Yes Curcio, Roselie Awkward, NP  pilocarpine (SALAGEN) 5 MG tablet TAKE 1 TABLET BY MOUTH 3 TIMES DAILY FOR DRY MOUTH. 09/26/16  Yes [name], MD  predniSONE (DELTASONE) 20 MG tablet 2 tablets daily for 3 days, 1 tablet daily for 4 days. 03/13/17  Yes Liane Comber, NP  PRESCRIPTION MEDICATION Pt gets chemo at [facility].   Yes [provider]  promethazine-dextromethorphan (PROMETHAZINE-DM) 6.25-15 MG/5ML syrup Take 5 mLs by mouth 4 (four) times daily as needed for cough. 03/13/17  Yes Liane Comber, NP  ranitidine (ZANTAC) 300 MG tablet TAKE 1 TO 2 TABLETS DAILY FOR HEARTBURN & REFLUX 11/30/16  Yes Vicie Mutters, PA-C  senna (SENOKOT) 8.6 MG tablet Take 1 tablet by mouth at bedtime.   Yes [provider]  traMADol (ULTRAM) 50 MG tablet Take 1 to 2 tablets by mouth 4 times a day as needed for pain. Patient taking differently: Take 50-100 mg by mouth every 6 (six) hours as needed for moderate pain or severe pain.   07/22/15  Yes [name], MD  vitamin C (ASCORBIC ACID) 250 MG tablet Take 250 mg by mouth daily.   Yes [provider]  benzonatate (TESSALON PERLES) 100 MG capsule Take 2 capsules (200 mg total) by mouth 3 (three) times daily as needed for cough (Max: 65m per day). 03/15/17   CLiane Comber NP  fluconazole (DIFLUCAN) 200 MG tablet Take 1 tablet (200 mg total) by mouth daily. Patient not taking: Reported on 03/13/2017 10/19/16   DRaleigh Callas NP    Family History Family History  Problem Relation Age of Onset  . Arthritis Mother   . Asthma Mother   . Cancer Sister   . Hyperlipidemia Brother     Social History Social History   Tobacco Use  . Smoking status: Former Smoker    Packs/day: 1.00    Years: 30.00    Pack years: 30.00    Types: Cigarettes    Last attempt to quit: 02/15/2005    Years since quitting: 12.0  . Smokeless tobacco: Never Used  Substance Use Topics  . Alcohol use: No  . Drug use: No     Allergies   Codeine; Latex; Other; Onion; Zyprexa [olanzapine]; Adhesive [tape]; Hydrocodone; Iodinated diagnostic agents; and Sulfa antibiotics   Review of Systems Review of Systems  Constitutional: Positive for chills and fever.  Respiratory: Positive for cough, shortness of breath and wheezing.   Neurological: Positive for weakness.  All other systems reviewed and are negative.    Physical Exam Updated Vital Signs BP (!) 130/55   Pulse 73   Temp 97.9 F (36.6 C) (Oral)   Resp 16   Ht 5' 5.5" (1.664 m)   Wt 76.7 kg (169 lb)   SpO2 94%   BMI 27.70 kg/m   Physical Exam  Constitutional: She is oriented to person, place, and time. She appears well-developed and well-nourished. No distress.  HENT:  Head: Normocephalic and atraumatic.  Mouth/Throat: Oropharynx is clear and moist.  Dry MM  Eyes: Conjunctivae are normal.  Neck: Neck supple.  Cardiovascular: Normal rate, regular rhythm and normal heart sounds. Exam reveals no friction rub.    No murmur heard. Pulmonary/Chest: Effort normal. Tachypnea noted. No respiratory distress. She has wheezes in the right upper field, the right middle  field, the right lower field, the left upper field, the left middle field and the left lower field. She has no rales.  Abdominal: She exhibits no distension.  Musculoskeletal: She exhibits no edema.  Neurological: She is alert and oriented to person, place, and time. She exhibits normal muscle tone.  Skin: Skin is warm. Capillary refill takes less than 2 seconds.  Psychiatric: She has a normal mood and affect.  Nursing note and vitals reviewed.    ED Treatments / Results  Labs (all labs ordered are listed, but only abnormal results are displayed) Labs Reviewed  CBC WITH DIFFERENTIAL/PLATELET - Abnormal; Notable for the following components:      Result Value   WBC 2.5 (*)    RBC 2.87 (*)    Hemoglobin 10.6 (*)    HCT 32.2 (*)    MCV 112.2 (*)    MCH 36.9 (*)    Platelets 85 (*)    Lymphs Abs 0.3 (*)    All other components within normal limits  BASIC METABOLIC PANEL - Abnormal; Notable for the following components:   CO2 21 (*)    Glucose, Bld 241 (*)    BUN 34 (*)    Creatinine, Ser 2.98 (*)    Calcium 8.8 (*)    GFR calc non Af Amer 14 (*)    GFR calc Af Amer 17 (*)    All other components within normal limits  INFLUENZA PANEL BY PCR (TYPE A & B) - Abnormal; Notable for the following components:   Influenza A By PCR POSITIVE (*)    All other components within normal limits  I-STAT CG4 LACTIC ACID, ED - Abnormal; Notable for the following components:   Lactic Acid, Venous 3.39 (*)    All other components within normal limits  CULTURE, BLOOD (ROUTINE X 2)  CULTURE, BLOOD (ROUTINE X 2)  I-STAT CG4 LACTIC ACID, ED    EKG  EKG Interpretation  Date/Time:  Friday March 15 2017 16:27:47 EST Ventricular Rate:  73 PR Interval:    QRS Duration: 93 QT Interval:  402 QTC Calculation: 443 R Axis:   66 Text Interpretation:   Sinus rhythm Low voltage, extremity and precordial leads Nonspecific T abnrm, anterolateral leads Baseline wander in lead(s) V3 Since last EKG, TW flipped in aVL  Confirmed by Duffy Bruce [phone]) on 03/15/2017 7:50:16 PM       Radiology Dg Chest 2 View  Result Date: 03/15/2017 CLINICAL DATA:  Patient states she was diagnosed with pneumonia two days ago and symptoms have increased even with medication. EXAM: CHEST  2 VIEW COMPARISON:  01/24/2017 and 09/19/2016 FINDINGS: Cardiac silhouette is normal in size. No mediastinal or hilar masses. No evidence of adenopathy. Relative increased opacity projects within the left mid lung, which appears to be due to asymmetry of the overlying breast tissue. There is no evidence of lung consolidation. Mild reticular opacities are noted at the lung bases consistent with scarring and/or subsegmental atelectasis. Lungs are otherwise clear. No pleural effusion or pneumothorax. Right internal jugular Port-A-Cath is stable. Skeletal structures are intact. Stable changes from previous left breast surgery. IMPRESSION: 1. No active cardiopulmonary disease. 2. No evidence of pneumonia. Electronically Signed   By: Lajean Manes M.D.   On: 03/15/2017 16:59    Procedures .Critical Care Performed by: Duffy Bruce, MD Authorized by: Duffy Bruce, MD   Critical care provider statement:    Critical care time (minutes):  35   Critical care time was exclusive of:  Separately  billable procedures and treating other patients and teaching time   Critical care was necessary to treat or prevent imminent or life-threatening deterioration of the following conditions:  Sepsis and circulatory failure   Critical care was time spent personally by me on the following activities:  Development of treatment plan with patient or surrogate, discussions with consultants, evaluation of patient's response to treatment, examination of patient, obtaining history from patient or surrogate,  ordering and performing treatments and interventions, ordering and review of laboratory studies, ordering and review of radiographic studies, pulse oximetry, re-evaluation of patient's condition and review of old charts   I assumed direction of critical care for this patient from another provider in my specialty: no     (including critical care time)   Medications Ordered in ED Medications  vancomycin (VANCOCIN) 1,500 mg in sodium chloride 0.9 % 500 mL IVPB (1,500 mg Intravenous New Bag/Given 03/15/17 2224)  0.9 %  sodium chloride infusion (not administered)  oseltamivir (TAMIFLU) capsule 30 mg (not administered)  albuterol (PROVENTIL) (2.5 MG/3ML) 0.083% nebulizer solution 5 mg (5 mg Nebulization Given 03/15/17 1633)  albuterol (PROVENTIL) (2.5 MG/3ML) 0.083% nebulizer solution 5 mg (5 mg Nebulization Given 03/15/17 2120)  ipratropium (ATROVENT) nebulizer solution 0.5 mg (0.5 mg Nebulization Given 03/15/17 2120)  sodium chloride 0.9 % bolus 1,000 mL (0 mLs Intravenous Stopped 03/15/17 2223)  sodium chloride 0.9 % bolus 1,000 mL (1,000 mLs Intravenous New Bag/Given 03/15/17 2309)    And  sodium chloride 0.9 % bolus 500 mL (0 mLs Intravenous Stopped 03/15/17 2300)  ceFEPIme (MAXIPIME) 1 g in sodium chloride 0.9 % 100 mL IVPB (0 g Intravenous Stopped 03/15/17 2224)     Initial Impression / Assessment and Plan / ED Course  I have reviewed the triage vital signs and the nursing notes.  Pertinent labs & imaging results that were available during my care of the patient were reviewed by me and considered in my medical decision making (see chart for details).     77 year old female with past medical history as above here with cough, weakness, and shortness of breath.  On arrival, patient with diffuse wheezing and increased work of breathing.  Lactic acid is 3.4.  Code sepsis initiated given patient's history of multiple myeloma and immune suppressed status with positive lactic acid and concern for pneumonia.   Broad-spectrum antibiotics started.  Chest x-ray obtained and fortunately shows no evidence of significant pneumonia.  She is flu positive.  Lab work is otherwise close to baseline.  White count is mildly depressed from her last value of 4.6 on 2/27.  Patient given fluids, broad-spectrum antibiotics, and will admit.  Final Clinical Impressions(s) / ED Diagnoses   Final diagnoses:  Sepsis, due to unspecified organism ([place])  Influenza A      Duffy Bruce, MD 03/16/17 0000

## 2017-03-15 NOTE — ED Notes (Signed)
Bed: WLPT1 Expected date:  Expected time:  Means of arrival:  Comments: 

## 2017-03-15 NOTE — Telephone Encounter (Signed)
Patient aware that she needs to go to the ER. Patient agreed.

## 2017-03-16 DIAGNOSIS — Z885 Allergy status to narcotic agent status: Secondary | ICD-10-CM | POA: Diagnosis not present

## 2017-03-16 DIAGNOSIS — Z882 Allergy status to sulfonamides status: Secondary | ICD-10-CM | POA: Diagnosis not present

## 2017-03-16 DIAGNOSIS — Z888 Allergy status to other drugs, medicaments and biological substances status: Secondary | ICD-10-CM | POA: Diagnosis not present

## 2017-03-16 DIAGNOSIS — Z853 Personal history of malignant neoplasm of breast: Secondary | ICD-10-CM | POA: Diagnosis not present

## 2017-03-16 DIAGNOSIS — J159 Unspecified bacterial pneumonia: Secondary | ICD-10-CM | POA: Diagnosis present

## 2017-03-16 DIAGNOSIS — E870 Hyperosmolality and hypernatremia: Secondary | ICD-10-CM | POA: Diagnosis present

## 2017-03-16 DIAGNOSIS — D61818 Other pancytopenia: Secondary | ICD-10-CM | POA: Diagnosis present

## 2017-03-16 DIAGNOSIS — F419 Anxiety disorder, unspecified: Secondary | ICD-10-CM | POA: Diagnosis present

## 2017-03-16 DIAGNOSIS — N179 Acute kidney failure, unspecified: Secondary | ICD-10-CM | POA: Diagnosis present

## 2017-03-16 DIAGNOSIS — A419 Sepsis, unspecified organism: Secondary | ICD-10-CM | POA: Diagnosis not present

## 2017-03-16 DIAGNOSIS — N183 Chronic kidney disease, stage 3 (moderate): Secondary | ICD-10-CM | POA: Diagnosis present

## 2017-03-16 DIAGNOSIS — J111 Influenza due to unidentified influenza virus with other respiratory manifestations: Secondary | ICD-10-CM | POA: Diagnosis not present

## 2017-03-16 DIAGNOSIS — J189 Pneumonia, unspecified organism: Secondary | ICD-10-CM | POA: Diagnosis not present

## 2017-03-16 DIAGNOSIS — M797 Fibromyalgia: Secondary | ICD-10-CM | POA: Diagnosis present

## 2017-03-16 DIAGNOSIS — Z91040 Latex allergy status: Secondary | ICD-10-CM | POA: Diagnosis not present

## 2017-03-16 DIAGNOSIS — Z91048 Other nonmedicinal substance allergy status: Secondary | ICD-10-CM | POA: Diagnosis not present

## 2017-03-16 DIAGNOSIS — J441 Chronic obstructive pulmonary disease with (acute) exacerbation: Secondary | ICD-10-CM

## 2017-03-16 DIAGNOSIS — K219 Gastro-esophageal reflux disease without esophagitis: Secondary | ICD-10-CM | POA: Diagnosis present

## 2017-03-16 DIAGNOSIS — I2510 Atherosclerotic heart disease of native coronary artery without angina pectoris: Secondary | ICD-10-CM | POA: Diagnosis present

## 2017-03-16 DIAGNOSIS — R05 Cough: Secondary | ICD-10-CM | POA: Diagnosis not present

## 2017-03-16 DIAGNOSIS — J1008 Influenza due to other identified influenza virus with other specified pneumonia: Secondary | ICD-10-CM | POA: Diagnosis present

## 2017-03-16 DIAGNOSIS — E860 Dehydration: Secondary | ICD-10-CM | POA: Diagnosis present

## 2017-03-16 DIAGNOSIS — J181 Lobar pneumonia, unspecified organism: Secondary | ICD-10-CM | POA: Diagnosis not present

## 2017-03-16 DIAGNOSIS — J432 Centrilobular emphysema: Secondary | ICD-10-CM | POA: Diagnosis present

## 2017-03-16 DIAGNOSIS — K449 Diaphragmatic hernia without obstruction or gangrene: Secondary | ICD-10-CM | POA: Diagnosis present

## 2017-03-16 DIAGNOSIS — Z91018 Allergy to other foods: Secondary | ICD-10-CM | POA: Diagnosis not present

## 2017-03-16 DIAGNOSIS — E039 Hypothyroidism, unspecified: Secondary | ICD-10-CM | POA: Diagnosis present

## 2017-03-16 DIAGNOSIS — J101 Influenza due to other identified influenza virus with other respiratory manifestations: Secondary | ICD-10-CM | POA: Diagnosis not present

## 2017-03-16 DIAGNOSIS — Z9484 Stem cells transplant status: Secondary | ICD-10-CM | POA: Diagnosis not present

## 2017-03-16 DIAGNOSIS — I252 Old myocardial infarction: Secondary | ICD-10-CM | POA: Diagnosis not present

## 2017-03-16 DIAGNOSIS — C9000 Multiple myeloma not having achieved remission: Secondary | ICD-10-CM | POA: Diagnosis present

## 2017-03-16 LAB — BASIC METABOLIC PANEL
ANION GAP: 9 (ref 5–15)
BUN: 27 mg/dL — ABNORMAL HIGH (ref 6–20)
CHLORIDE: 116 mmol/L — AB (ref 101–111)
CO2: 21 mmol/L — ABNORMAL LOW (ref 22–32)
Calcium: 7.7 mg/dL — ABNORMAL LOW (ref 8.9–10.3)
Creatinine, Ser: 2.64 mg/dL — ABNORMAL HIGH (ref 0.44–1.00)
GFR calc Af Amer: 19 mL/min — ABNORMAL LOW (ref >60)
GFR calc non Af Amer: 17 mL/min — ABNORMAL LOW (ref >60)
GLUCOSE: 144 mg/dL — AB (ref 65–99)
POTASSIUM: 3.5 mmol/L (ref 3.5–5.1)
Sodium: 146 mmol/L — ABNORMAL HIGH (ref 135–145)

## 2017-03-16 LAB — I-STAT CG4 LACTIC ACID, ED: Lactic Acid, Venous: 3.65 mmol/L (ref 0.5–1.9)

## 2017-03-16 MED ORDER — ZOLPIDEM TARTRATE 5 MG PO TABS
5.0000 mg | ORAL_TABLET | Freq: Every evening | ORAL | Status: DC | PRN
  Administered 2017-03-17 – 2017-03-18 (×2): 5 mg via ORAL
  Filled 2017-03-16 (×2): qty 1

## 2017-03-16 MED ORDER — IPRATROPIUM BROMIDE 0.02 % IN SOLN
0.5000 mg | Freq: Four times a day (QID) | RESPIRATORY_TRACT | Status: DC
  Administered 2017-03-16: 0.5 mg via RESPIRATORY_TRACT
  Filled 2017-03-16: qty 2.5

## 2017-03-16 MED ORDER — IPRATROPIUM-ALBUTEROL 0.5-2.5 (3) MG/3ML IN SOLN
3.0000 mL | Freq: Three times a day (TID) | RESPIRATORY_TRACT | Status: DC
  Administered 2017-03-16 – 2017-03-18 (×7): 3 mL via RESPIRATORY_TRACT
  Filled 2017-03-16 (×8): qty 3

## 2017-03-16 MED ORDER — TRAMADOL HCL 50 MG PO TABS: 50.0000 mg | ORAL_TABLET | Freq: Four times a day (QID) | ORAL | Status: DC | PRN

## 2017-03-16 MED ORDER — MAGIC MOUTHWASH
5.0000 mL | Freq: Four times a day (QID) | ORAL | Status: DC | PRN
  Filled 2017-03-16: qty 5

## 2017-03-16 MED ORDER — ONDANSETRON HCL 4 MG PO TABS: 4.0000 mg | ORAL_TABLET | Freq: Four times a day (QID) | ORAL | Status: DC | PRN

## 2017-03-16 MED ORDER — ONDANSETRON HCL 4 MG/2ML IJ SOLN
4.0000 mg | Freq: Four times a day (QID) | INTRAMUSCULAR | Status: DC | PRN
  Administered 2017-03-16 – 2017-03-19 (×3): 4 mg via INTRAVENOUS
  Filled 2017-03-16 (×3): qty 2

## 2017-03-16 MED ORDER — OSELTAMIVIR PHOSPHATE 30 MG PO CAPS
30.0000 mg | ORAL_CAPSULE | Freq: Every day | ORAL | Status: DC
  Administered 2017-03-16 – 2017-03-19 (×4): 30 mg via ORAL
  Filled 2017-03-16 (×4): qty 1

## 2017-03-16 MED ORDER — MONTELUKAST SODIUM 10 MG PO TABS
10.0000 mg | ORAL_TABLET | Freq: Every day | ORAL | Status: DC
  Administered 2017-03-16 – 2017-03-19 (×4): 10 mg via ORAL
  Filled 2017-03-16 (×4): qty 1

## 2017-03-16 MED ORDER — SENNA 8.6 MG PO TABS
1.0000 | ORAL_TABLET | Freq: Every day | ORAL | Status: DC
  Administered 2017-03-16 – 2017-03-18 (×3): 8.6 mg via ORAL
  Filled 2017-03-16 (×3): qty 1

## 2017-03-16 MED ORDER — ENSURE ENLIVE PO LIQD
237.0000 mL | Freq: Two times a day (BID) | ORAL | Status: DC
  Administered 2017-03-16 – 2017-03-19 (×7): 237 mL via ORAL

## 2017-03-16 MED ORDER — HEPARIN SODIUM (PORCINE) 5000 UNIT/ML IJ SOLN
5000.0000 [IU] | Freq: Three times a day (TID) | INTRAMUSCULAR | Status: DC
  Administered 2017-03-16 – 2017-03-18 (×9): 5000 [IU] via SUBCUTANEOUS
  Filled 2017-03-16 (×10): qty 1

## 2017-03-16 MED ORDER — SODIUM CHLORIDE 0.45 % IV SOLN
INTRAVENOUS | Status: DC
  Administered 2017-03-16 – 2017-03-17 (×2): via INTRAVENOUS

## 2017-03-16 MED ORDER — NITROGLYCERIN 0.4 MG SL SUBL: 0.4000 mg | SUBLINGUAL_TABLET | SUBLINGUAL | Status: DC | PRN

## 2017-03-16 MED ORDER — ACETAMINOPHEN 325 MG PO TABS: 650.0000 mg | ORAL_TABLET | Freq: Four times a day (QID) | ORAL | Status: DC | PRN

## 2017-03-16 MED ORDER — METHYLPREDNISOLONE SODIUM SUCC 40 MG IJ SOLR
40.0000 mg | Freq: Two times a day (BID) | INTRAMUSCULAR | Status: DC
Start: 2017-03-16 — End: 2017-03-19
  Administered 2017-03-16 – 2017-03-19 (×6): 40 mg via INTRAVENOUS
  Filled 2017-03-16 (×6): qty 1

## 2017-03-16 MED ORDER — ALBUTEROL SULFATE (2.5 MG/3ML) 0.083% IN NEBU: 2.5000 mg | INHALATION_SOLUTION | RESPIRATORY_TRACT | Status: DC | PRN

## 2017-03-16 MED ORDER — LEVOTHYROXINE SODIUM 50 MCG PO TABS
75.0000 ug | ORAL_TABLET | Freq: Every day | ORAL | Status: DC
  Administered 2017-03-16 – 2017-03-19 (×4): 75 ug via ORAL
  Filled 2017-03-16 (×4): qty 1

## 2017-03-16 MED ORDER — CITALOPRAM HYDROBROMIDE 20 MG PO TABS
40.0000 mg | ORAL_TABLET | Freq: Every day | ORAL | Status: DC
  Administered 2017-03-16 – 2017-03-19 (×4): 40 mg via ORAL
  Filled 2017-03-16 (×4): qty 2

## 2017-03-16 MED ORDER — BENZONATATE 100 MG PO CAPS: 200.0000 mg | ORAL_CAPSULE | Freq: Three times a day (TID) | ORAL | Status: DC | PRN

## 2017-03-16 MED ORDER — ALBUTEROL SULFATE (2.5 MG/3ML) 0.083% IN NEBU
2.5000 mg | INHALATION_SOLUTION | Freq: Four times a day (QID) | RESPIRATORY_TRACT | Status: DC
  Administered 2017-03-16: 2.5 mg via RESPIRATORY_TRACT
  Filled 2017-03-16: qty 3

## 2017-03-16 MED ORDER — GABAPENTIN 300 MG PO CAPS
300.0000 mg | ORAL_CAPSULE | Freq: Every day | ORAL | Status: DC
  Administered 2017-03-16 – 2017-03-18 (×3): 300 mg via ORAL
  Filled 2017-03-16 (×3): qty 1

## 2017-03-16 MED ORDER — SODIUM CHLORIDE 0.9 % IV SOLN
INTRAVENOUS | Status: DC
  Administered 2017-03-16: 03:00:00 via INTRAVENOUS

## 2017-03-16 MED ORDER — FAMOTIDINE 20 MG PO TABS
10.0000 mg | ORAL_TABLET | Freq: Two times a day (BID) | ORAL | Status: DC
  Administered 2017-03-16 – 2017-03-19 (×8): 10 mg via ORAL
  Filled 2017-03-16 (×8): qty 1

## 2017-03-16 NOTE — Progress Notes (Addendum)
Patient ID: Nancy Norris, female   DOB: 07/31/1940, 77 y.o.   MRN: [id number]    PROGRESS NOTE  Nancy Norris  LJQ:[id number] DOB: 02-Oct-1940 DOA: 03/15/2017  PCP: [name], MD   Brief Narrative:  Pt admitted after midnight, please see earlier admission note by Dr. Laren Everts. Pt with hx of breast cancer and recently diagnosed with MM follows with Dr. Julien Nordmann, presented with several days duration of progressively worsening fatigue, poor oral intake, dyspnea with exertion as well as at rest. Flu A positive.   Assessment & Plan:   Active Problems:   Acute reports failure due to Influenza A - appears to be somewhat better - will continue supportive care with bronchodilators scheduled and as needed - keep on oxygen via [place] for now and taper off as clinically indicated - continue Tamiflu - add Solumedrol due to wheezing     Pancytopenia - possibly reactive and in the setting of known MM - will monitor counts while inpatient     Acute kidney injury imposed on CKD stage III - baseline Cr ~2.4 - up on this admission and likely from pre renal etiology - IVF provided, Cr is trending down - continue same regimen - BMP in AM    Hypernatremia - will change IVF to 1/2 normal saline - BMP in AM    Hypothyroidism - will continue synthroid  DVT prophylaxis: Heparin SQ Code Status: DNR Family Communication: Patient at bedside  Disposition Plan: to be determined   Consultants:   None  Procedures:   None  Antimicrobials:   Tamiflu 03/02 -->   Subjective: Pt reports feeling tired, somewhat better.   Objective: Vitals:   03/16/17 0151 03/16/17 0259 03/16/17 0447 03/16/17 0908  BP: 121/63  (!) 104/55   Pulse: 73  73   Resp: 16  18   Temp: 97.8 F (36.6 C)  98 F (36.7 C)   TempSrc: Oral  Oral   SpO2: 99% 96% 96% 96%  Weight: 79.9 kg (176 lb 2.4 oz)     Height: 5\' 5"  (1.651 m)       Intake/Output Summary (Last 24 hours) at 03/16/2017 1219 Last data filed at 03/16/2017  0411 Gross per 24 hour  Intake 373.75 ml  Output -  Net 373.75 ml   Filed Weights   03/15/17 1619 03/16/17 0151  Weight: 76.7 kg (169 lb) 79.9 kg (176 lb 2.4 oz)    Examination:  General exam: Appears calm, NAD Respiratory system: Exp wheezing with rhonchi at bases  Cardiovascular system: S1 & S2 heard, RRR. No JVD, murmurs, rubs, gallops or clicks. Gastrointestinal system: Abdomen is nondistended, soft and nontender. No organomegaly  Data Reviewed: I have personally reviewed following labs and imaging studies  CBC: Recent Labs  Lab 03/13/17 1212 03/15/17 2042  WBC 4.6 2.5*  NEUTROABS 2,650 2.1  HGB 11.7 10.6*  HCT 34.2* 32.2*  MCV 106.2* 112.2*  PLT 56* 85*   Basic Metabolic Panel: Recent Labs  Lab 03/13/17 1212 03/15/17 2042 03/16/17 1015  NA 144 141 146*  K 4.6 4.1 3.5  CL 108 108 116*  CO2 26 21* 21*  GLUCOSE 126* 241* 144*  BUN 30* 34* 27*  CREATININE 3.39* 2.98* 2.64*  CALCIUM 9.5 8.8* 7.7*   Urine analysis:    Component Value Date/Time   COLORURINE YELLOW 03/13/2017 [place] 03/13/2017 1351   LABSPEC 1.020 03/13/2017 1351   LABSPEC 1.010 09/19/2016 0908   PHURINE 5.5 03/13/2017 1351  GLUCOSEU TRACE (A) 03/13/2017 1351   GLUCOSEU Negative 09/19/2016 0908   HGBUR NEGATIVE 03/13/2017 1351   BILIRUBINUR Negative 09/19/2016 0908   KETONESUR NEGATIVE 03/13/2017 1351   PROTEINUR 2+ (A) 03/13/2017 1351   UROBILINOGEN 0.2 09/19/2016 0908   NITRITE Negative 09/19/2016 0908   NITRITE NEGATIVE 05/30/2016 1152   LEUKOCYTESUR Negative 09/19/2016 0908   Recent Results (from the past 240 hour(s))  Urine Culture     Status: None   Collection Time: 03/13/17  1:51 PM  Result Value Ref Range Status   MICRO NUMBER: [id number]  Final   SPECIMEN QUALITY: ADEQUATE  Final   Sample Source URINE  Final   STATUS: FINAL  Final   Result:   Final    Three or more organisms present, each greater than 10,000 cu/mL. May represent normal flora  contamination from external genitalia. No further testing is required.    Radiology Studies: Dg Chest 2 View Result Date: 03/15/2017 1. No active cardiopulmonary disease. 2. No evidence of pneumonia.   Scheduled Meds: . citalopram  40 mg Oral Daily  . famotidine  10 mg Oral BID  . feeding supplement (ENSURE ENLIVE)  237 mL Oral BID BM  . gabapentin  300 mg Oral QHS  . heparin  5,000 Units Subcutaneous Q8H  . ipratropium-albuterol  3 mL Nebulization TID  . levothyroxine  75 mcg Oral QAC breakfast  . montelukast  10 mg Oral Daily  . oseltamivir  30 mg Oral Daily  . senna  1 tablet Oral QHS   Continuous Infusions: . sodium chloride Stopped (03/16/17 0243)  . sodium chloride 50 mL/hr at 03/16/17 0241     LOS: 0 days   Time spent: 25 minutes   Faye Ramsay, MD [facility] Pager [phone]  If 7PM-7AM, please contact night-coverage [email or web address] Password [facility] 03/16/2017, 12:19 PM

## 2017-03-16 NOTE — ED Notes (Addendum)
Lactic acid 3.65 mmol/L. Reynold Bowen, RN, notified, and she stated that she would notify Dr. Laren Everts.

## 2017-03-16 NOTE — H&P (Signed)
[facility]                                                                                    Patient Demographics  Nancy Norris, is a 77 y.o. female  CSN: [id number]  MRN: [id number]  DOB - [birth year]  Admit Date - 03/15/2017  Outpatient Primary MD for the patient is [name], MD   With History of -  Past Medical History:  Diagnosis Date  . Anemia   . Anginal pain ([place])    used NTG x 2 May 31 and 06/15/13   . Anxiety   . Arthritis   . B12 deficiency 12/04/2014  . Breast cancer ([place])   . CKD (chronic kidney disease) stage 3, GFR 30-59 ml/min (HCC)   . Complication of anesthesia   . COPD (chronic obstructive pulmonary disease) ([place])   . Cryptococcal pneumonitis ([place]) 11/22/2015  . Depression   . Dizziness   . Dyspnea   . Fibromyalgia   . Fibromyalgia   . GERD (gastroesophageal reflux disease)   . Headache(784.0)   . Heart murmur   . Hemoptysis 10/21/2015  . History of blood transfusion    last one May 12   . Hx of cardiovascular stress test    LexiScan with low level exercise Myoview (02/2013): No ischemia, EF 72%; normal study  . Hx of echocardiogram    a.  Echocardiogram (12/26/2012): EF 39-03%, grade 1 diastolic dysfunction;   b.  Echocardiogram (02/2013): EF 55-60%, no WMA, trivial effusion  . Hyperkalemia   . Hyponatremia   . Hypothyroidism   . Mucositis   . Multiple myeloma   . Myocardial infarction [facility])    in past, patient was unaware.   . Neuropathy   . Pain in joint, pelvic region and thigh 07/07/2015  . PONV (postoperative nausea and vomiting) 2008   after mastestomy      Past Surgical History:  Procedure Laterality Date  . ABDOMINAL HYSTERECTOMY  1981  . AV FISTULA PLACEMENT Left 06/19/2013   Procedure: CREATION OF LEFT ARM ARTERIOVENOUS (AV) FISTULA ;  Surgeon: Angelia Mould, MD;  Location: [facility];  Service: Vascular;  Laterality: Left;  . BREAST RECONSTRUCTION    . BREAST SURGERY    . CATARACT EXTRACTION, BILATERAL     . CHOLECYSTECTOMY  1971  . EYE SURGERY Bilateral    lens implant  . history of Port removal    . LUNG BIOPSY  10/21/2015  . MASTECTOMY Left 2008  . PORTACATH PLACEMENT  12/2012   has had 2  . Status post stem cell transplant on September 28, 2008.      in for   Chief Complaint  Patient presents with  . Shortness of Breath     HPI  Nancy Norris  is a 77 y.o. female, with past medical history significant for breast cancer and recently diagnosed multiple myeloma followed by Dr. Earlie Server and not on treatment yet presenting with a few days history of weakness, cough, tachypnea and decreased appetite.  Patient did not take her medications as well.  She saw her primary medical doctor on Wednesday and was started on doxycycline and prednisone.  In the emergency room the patient was noted to be tach tachypneic, leukopenic with elevated lactate at 3.4 . flu A was positive .  Her creatinine was elevated but not that much different from baseline    Review of Systems    In addition to the HPI above,  No Fever-chills, No Headache, No changes with Vision or hearing, No problems swallowing food or Liquids, No Abdominal pain, No Nausea or Vommitting, Bowel movements are regular, No Blood in stool or Urine, No dysuria, No new skin rashes or bruises, No new joints pains-aches,  No recent weight gain or loss, No polyuria, polydypsia or polyphagia, No significant Mental Stressors.  A full 10 point Review of Systems was done, except as stated above, all other Review of Systems were negative.   Social History Social History   Tobacco Use  . Smoking status: Former Smoker    Packs/day: 1.00    Years: 30.00    Pack years: 30.00    Types: Cigarettes    Last attempt to quit: 02/15/2005    Years since quitting: 12.0  . Smokeless tobacco: Never Used  Substance Use Topics  . Alcohol use: No     Family History Family History  Problem Relation Age of Onset  . Arthritis Mother   .  Asthma Mother   . Cancer Sister   . Hyperlipidemia Brother      Prior to Admission medications   Medication Sig Start Date End Date Taking? Authorizing Provider  acetaminophen (TYLENOL) 325 MG tablet Take 650 mg by mouth every 6 (six) hours as needed for mild pain or moderate pain.    Yes [provider]  albuterol (PROVENTIL HFA;VENTOLIN HFA) 108 (90 Base) MCG/ACT inhaler Inhale 1-2 puffs into the lungs every 6 (six) hours as needed for wheezing or shortness of breath (cough). 08/30/15  Yes Forcucci, Courtney, PA-C  butalbital-acetaminophen-caffeine (FIORICET, ESGIC) 50-325-40 MG tablet Take 1 tablet by mouth every 4 (four) hours as needed. 08/03/16  Yes [provider]  Cholecalciferol 4000 UNITS TABS Take 4,000 Units by mouth daily.   Yes [provider]  citalopram (CELEXA) 40 MG tablet TAKE 1 TABLET BY MOUTH DAILY OR AS DIRECTED Patient taking differently: Take 40 mg by mouth daily 11/30/16  Yes Vicie Mutters, PA-C  cromolyn (OPTICROM) 4 % ophthalmic solution Place 1 drop into both eyes 4 (four) times daily as needed (irritation).    Yes [provider]  dexamethasone (DECADRON) 4 MG tablet Take 1 tablet (4 mg total) by mouth daily. Take 10 tablets by mouth once a week as directed. 10/10/16  Yes Curt Bears, MD  doxycycline (VIBRAMYCIN) 100 MG capsule Take 1 capsule 2 x/day with food for 10 days 03/13/17  Yes Liane Comber, NP  furosemide (LASIX) 40 MG tablet TAKE 1 TABLET BY MOUTH DAILY. Patient taking differently: Take 40 mg by mouth as needed for fluid 12/03/16  Yes [name], MD  gabapentin (NEURONTIN) 300 MG capsule TAKE 1 CAPSULE BY MOUTH AT BEDTIME. 10/08/16  Yes [name], MD  levothyroxine (SYNTHROID, LEVOTHROID) 150 MCG tablet Take 1 tablet daily - need office visit before further refills 01/22/17  Yes [name], MD  loperamide (IMODIUM) 2 MG capsule Take 1 capsule (2 mg total) by mouth as needed for diarrhea or loose  stools. 02/28/16  Yes [name], MD  magic mouthwash SOLN Take 5 mLs by mouth 4 (four) times daily as needed for mouth pain. 10/27/15  Yes Curt Bears, MD  montelukast (SINGULAIR) 10  MG tablet TAKE 1 TABLET BY MOUTH DAILY. 08/03/16  Yes [name], MD  nitroGLYCERIN (NITROSTAT) 0.4 MG SL tablet Place 0.4 mg under the tongue every 5 (five) minutes as needed for chest pain. Reported on 03/30/2015   Yes [provider]  nystatin (MYCOSTATIN/NYSTOP) powder Apply topically 2 (two) times daily. Patient taking differently: Apply 1 g topically 2 (two) times daily as needed (rash).  11/08/15  Yes Curcio, Roselie Awkward, NP  pilocarpine (SALAGEN) 5 MG tablet TAKE 1 TABLET BY MOUTH 3 TIMES DAILY FOR DRY MOUTH. 09/26/16  Yes [name], MD  predniSONE (DELTASONE) 20 MG tablet 2 tablets daily for 3 days, 1 tablet daily for 4 days. 03/13/17  Yes Liane Comber, NP  PRESCRIPTION MEDICATION Pt gets chemo at [facility]   Yes [provider]  promethazine-dextromethorphan (PROMETHAZINE-DM) 6.25-15 MG/5ML syrup Take 5 mLs by mouth 4 (four) times daily as needed for cough. 03/13/17  Yes Liane Comber, NP  ranitidine (ZANTAC) 300 MG tablet TAKE 1 TO 2 TABLETS DAILY FOR HEARTBURN & REFLUX 11/30/16  Yes Vicie Mutters, PA-C  senna (SENOKOT) 8.6 MG tablet Take 1 tablet by mouth at bedtime.   Yes [provider]  traMADol (ULTRAM) 50 MG tablet Take 1 to 2 tablets by mouth 4 times a day as needed for pain. Patient taking differently: Take 50-100 mg by mouth every 6 (six) hours as needed for moderate pain or severe pain.  07/22/15  Yes [name], MD  vitamin C (ASCORBIC ACID) 250 MG tablet Take 250 mg by mouth daily.   Yes [provider]  benzonatate (TESSALON PERLES) 100 MG capsule Take 2 capsules (200 mg total) by mouth 3 (three) times daily as needed for cough (Max: 683m per day). 03/15/17   CLiane Comber NP  fluconazole (DIFLUCAN) 200 MG tablet Take 1 tablet (200 mg  total) by mouth daily. Patient not taking: Reported on 03/13/2017 10/19/16   DRaleigh Callas NP    Allergies  Allergen Reactions  . Codeine Anaphylaxis  . Latex Shortness Of Breath    Adhesive products   . Other Other (See Comments)    Onion, chocolate causes migraines  . Onion Other (See Comments)    Causes migraine headaches  . Zyprexa [Olanzapine] Other (See Comments)    Confusion , dizzy,unsteady  . Adhesive [Tape] Other (See Comments)    blisters  . Hydrocodone Rash    With extreme itching  . Iodinated Diagnostic Agents Hives, Itching and Rash    Happened 60 years ago Stage IV kidney function  . Sulfa Antibiotics Itching    Physical Exam  Vitals  Blood pressure 119/60, pulse 72, temperature 97.9 F (36.6 C), temperature source Oral, resp. rate 18, height 5' 5.5" (1.664 m), weight 76.7 kg (169 lb), SpO2 97 %.   1. General well-developed, well-nourished female, looks tired  2. Normal affect and insight, Not Suicidal or Homicidal, Awake Alert, Oriented X 3.  3. No F.N deficits, patient moving all extremities.  4. Ears and Eyes appear Normal, Conjunctivae clear, PERRLA.  Dry oral Mucosa.  5. Supple Neck, No JVD, No cervical lymphadenopathy appriciated, No Carotid Bruits.  6. Symmetrical Chest wall movement, mild scattered rhonchi.  7. RRR, No Gallops, Rubs or Murmurs, No Parasternal Heave.  8. Positive Bowel Sounds, Abdomen Soft, Non tender, No organomegaly appriciated,No rebound -guarding or rigidity.  9.  No Cyanosis, Normal Skin Turgor, No Skin Rash or Bruise.  10. Good muscle tone,  joints appear normal , no effusions, Normal  ROM.    Data Review  CBC Recent Labs  Lab 03/13/17 1212 03/15/17 2042  WBC 4.6 2.5*  HGB 11.7 10.6*  HCT 34.2* 32.2*  PLT 56* 85*  MCV 106.2* 112.2*  MCH 36.3* 36.9*  MCHC 34.2 32.9  RDW 13.6 14.2  LYMPHSABS 1,546 0.3*  MONOABS  --  0.1  EOSABS 18 0.0  BASOSABS 9 0.0    ------------------------------------------------------------------------------------------------------------------  Chemistries  Recent Labs  Lab 03/13/17 1212 03/15/17 2042  NA 144 141  K 4.6 4.1  CL 108 108  CO2 26 21*  GLUCOSE 126* 241*  BUN 30* 34*  CREATININE 3.39* 2.98*  CALCIUM 9.5 8.8*   ------------------------------------------------------------------------------------------------------------------ estimated creatinine clearance is 16.9 mL/min (A) (by C-G formula based on SCr of 2.98 mg/dL (H)). ------------------------------------------------------------------------------------------------------------------ No results for input(s): TSH, T4TOTAL, T3FREE, THYROIDAB in the last 72 hours.  Invalid input(s): FREET3   Coagulation profile No results for input(s): INR, PROTIME in the last 168 hours. ------------------------------------------------------------------------------------------------------------------- No results for input(s): DDIMER in the last 72 hours. -------------------------------------------------------------------------------------------------------------------  Cardiac Enzymes No results for input(s): CKMB, TROPONINI, MYOGLOBIN in the last 168 hours.  Invalid input(s): CK ------------------------------------------------------------------------------------------------------------------ Invalid input(s): POCBNP   ---------------------------------------------------------------------------------------------------------------  Urinalysis    Component Value Date/Time   COLORURINE YELLOW 03/13/2017 1351   APPEARANCEUR CLEAR 03/13/2017 1351   LABSPEC 1.020 03/13/2017 1351   LABSPEC 1.010 09/19/2016 0908   PHURINE 5.5 03/13/2017 1351   GLUCOSEU TRACE (A) 03/13/2017 1351   GLUCOSEU Negative 09/19/2016 0908   HGBUR NEGATIVE 03/13/2017 1351   BILIRUBINUR Negative 09/19/2016 0908   KETONESUR NEGATIVE 03/13/2017 1351   PROTEINUR 2+ (A) 03/13/2017 1351    UROBILINOGEN 0.2 09/19/2016 0908   NITRITE Negative 09/19/2016 0908   NITRITE NEGATIVE 05/30/2016 1152   LEUKOCYTESUR Negative 09/19/2016 0908    ----------------------------------------------------------------------------------------------------------------   Imaging results:   Dg Chest 2 View  Result Date: 03/15/2017 CLINICAL DATA:  Patient states she was diagnosed with pneumonia two days ago and symptoms have increased even with medication. EXAM: CHEST  2 VIEW COMPARISON:  01/24/2017 and 09/19/2016 FINDINGS: Cardiac silhouette is normal in size. No mediastinal or hilar masses. No evidence of adenopathy. Relative increased opacity projects within the left mid lung, which appears to be due to asymmetry of the overlying breast tissue. There is no evidence of lung consolidation. Mild reticular opacities are noted at the lung bases consistent with scarring and/or subsegmental atelectasis. Lungs are otherwise clear. No pleural effusion or pneumothorax. Right internal jugular Port-A-Cath is stable. Skeletal structures are intact. Stable changes from previous left breast surgery. IMPRESSION: 1. No active cardiopulmonary disease. 2. No evidence of pneumonia. Electronically Signed   By: Lajean Manes M.D.   On: 03/15/2017 16:59    My personal review of EKG: Rhythm NSR, 63 bpm    Assessment & Plan  1.  Influenza A 2.  Dehydration/weakness 3.  Recently diagnosed multiple myeloma, not on treatment yet 4.  History of breast cancer 5.  Chronic renal failure  Plan  Admit to med/surg IV fluids Hold IV antibiotics Tamiflu   DVT Prophylaxis heparin  AM Labs Ordered, also please review Full Orders    Code Status DNR  Disposition Plan: Home  Time spent in minutes : 43 minutes  Condition GUARDED   _0 @

## 2017-03-17 ENCOUNTER — Inpatient Hospital Stay (HOSPITAL_COMMUNITY): Payer: Medicare Other

## 2017-03-17 LAB — BASIC METABOLIC PANEL
Anion gap: 8 (ref 5–15)
BUN: 23 mg/dL — ABNORMAL HIGH (ref 6–20)
CHLORIDE: 111 mmol/L (ref 101–111)
CO2: 21 mmol/L — ABNORMAL LOW (ref 22–32)
Calcium: 8 mg/dL — ABNORMAL LOW (ref 8.9–10.3)
Creatinine, Ser: 2.34 mg/dL — ABNORMAL HIGH (ref 0.44–1.00)
GFR calc non Af Amer: 19 mL/min — ABNORMAL LOW (ref >60)
GFR, EST AFRICAN AMERICAN: 22 mL/min — AB (ref >60)
Glucose, Bld: 156 mg/dL — ABNORMAL HIGH (ref 65–99)
Potassium: 4.1 mmol/L (ref 3.5–5.1)
SODIUM: 140 mmol/L (ref 135–145)

## 2017-03-17 LAB — STREP PNEUMONIAE URINARY ANTIGEN: STREP PNEUMO URINARY ANTIGEN: NEGATIVE

## 2017-03-17 LAB — CBC
HCT: 28.1 % — ABNORMAL LOW (ref 36.0–46.0)
HEMOGLOBIN: 9.1 g/dL — AB (ref 12.0–15.0)
MCH: 36.7 pg — ABNORMAL HIGH (ref 26.0–34.0)
MCHC: 32.4 g/dL (ref 30.0–36.0)
MCV: 113.3 fL — ABNORMAL HIGH (ref 78.0–100.0)
Platelets: 78 10*3/uL — ABNORMAL LOW (ref 150–400)
RBC: 2.48 MIL/uL — AB (ref 3.87–5.11)
RDW: 14.4 % (ref 11.5–15.5)
WBC: 3.3 10*3/uL — AB (ref 4.0–10.5)

## 2017-03-17 MED ORDER — ACETAMINOPHEN 325 MG PO TABS
650.0000 mg | ORAL_TABLET | Freq: Four times a day (QID) | ORAL | Status: DC | PRN
  Administered 2017-03-17: 650 mg via ORAL
  Filled 2017-03-17: qty 2

## 2017-03-17 MED ORDER — TRAMADOL HCL 50 MG PO TABS: 50.0000 mg | ORAL_TABLET | Freq: Four times a day (QID) | ORAL | Status: DC | PRN

## 2017-03-17 MED ORDER — SODIUM CHLORIDE 0.9 % IV SOLN
1.0000 g | INTRAVENOUS | Status: DC
Start: 2017-03-17 — End: 2017-03-19
  Administered 2017-03-17 – 2017-03-19 (×3): 1 g via INTRAVENOUS
  Filled 2017-03-17 (×3): qty 1

## 2017-03-17 MED ORDER — SODIUM CHLORIDE 0.9 % IV SOLN
500.0000 mg | INTRAVENOUS | Status: DC
  Administered 2017-03-17 – 2017-03-18 (×2): 500 mg via INTRAVENOUS
  Filled 2017-03-17 (×3): qty 500

## 2017-03-17 NOTE — Progress Notes (Addendum)
Patient ID: Nancy Norris, female   DOB: [birth year], 77 y.o.   MRN: [id number]    PROGRESS NOTE  Nancy Norris  GSU:[id number] DOB: 1940/04/14 DOA: 03/15/2017  PCP: [name], MD   Brief Narrative:  Pt with hx of breast cancer and recently diagnosed with MM follows with Dr. Julien Nordmann, presented with several days duration of progressively worsening fatigue, poor oral intake, dyspnea with exertion as well as at rest. Flu A positive.   Assessment & Plan:   Active Problems:   Acute reports failure due to Influenza A, ? Superimposed bacterial PNA - appears to be improving but still with diffuse rhonchi and wheezing on exam  - will proceed with CT chest for clearer evaluation - pt tells me she has chronic RUL fungal mass for which she has been seen ID doctor Dr. Linus Salmons - will reassess this with CT as well  - will place order for sputum culture, place on empiric Zithro and rocephin for now  - I will continue bronchodilators scheduled and as needed - keep on oxygen via [place] for now - continue Tamiflu day #2/5, continue Solumedrol and suspect we can try to taper down in next 24 hours     Pancytopenia - possibly reactive and in the setting of known MM - will continue to monitor counts while inpatient     Acute kidney injury imposed on CKD stage III - baseline Cr ~2.4 - up on this admission and likely from pre renal etiology - IVF have been provided and Cr is trending down - BMP in AM    Hypernatremia - IVF changed to 1/2 NS and Na is now WNL  - BMP in AM    Hypothyroidism - continue home regimen with synthroid    MM - sees Dr. Julien Nordmann, I have notified him via EPIC of pt's admission - pt says she was supposed to have specific blood work done tomorrow am in an order to start treatment next week - will check with Dr Julien Nordmann as we can likely order it here while inpatient   DVT prophylaxis: Heparin SQ Code Status: DNR Family Communication: pt at bedside  Disposition Plan: will go  home when clinically stable   Consultants:   None  Procedures:   None  Antimicrobials:   Tamiflu 03/02 -->   Zithromax 03/03 -->  Rocephin 03/03 -->  Subjective: Pt reports feeling better but still with exertional dyspnea as well as dyspnea at rest.   Objective: Vitals:   03/16/17 2010 03/16/17 2115 03/17/17 0356 03/17/17 0932  BP: 126/61  127/68   Pulse: 74  72   Resp: 16  14   Temp: 99.5 F (37.5 C)  98.1 F (36.7 C)   TempSrc: Oral  Oral   SpO2: 97% 98% 99% 96%  Weight:      Height:        Intake/Output Summary (Last 24 hours) at 03/17/2017 1307 Last data filed at 03/17/2017 0536 Gross per 24 hour  Intake 1200 ml  Output -  Net 1200 ml   Filed Weights   03/15/17 1619 03/16/17 0151  Weight: 76.7 kg (169 lb) 79.9 kg (176 lb 2.4 oz)   Physical Exam  Constitutional: Appears more alert this AM, NAD CVS: RRR, S1/S2 +, no murmurs, no gallops, no carotid bruit.  Pulmonary: Diffuse rhonchi and exp wheezing noted in lower lobes  Abdominal: Soft. BS +,  no distension, tenderness, rebound or guarding.  Musculoskeletal: Normal range of motion. No edema and no  tenderness.  Neuro: Alert. Normal reflexes, muscle tone coordination. No cranial nerve deficit.  Data Reviewed: I have personally reviewed following labs and imaging studies  CBC: Recent Labs  Lab 03/13/17 1212 03/15/17 2042 03/17/17 0506  WBC 4.6 2.5* 3.3*  NEUTROABS 2,650 2.1  --   HGB 11.7 10.6* 9.1*  HCT 34.2* 32.2* 28.1*  MCV 106.2* 112.2* 113.3*  PLT 56* 85* 78*   Basic Metabolic Panel: Recent Labs  Lab 03/13/17 1212 03/15/17 2042 03/16/17 1015 03/17/17 0506  NA 144 141 146* 140  K 4.6 4.1 3.5 4.1  CL 108 108 116* 111  CO2 26 21* 21* 21*  GLUCOSE 126* 241* 144* 156*  BUN 30* 34* 27* 23*  CREATININE 3.39* 2.98* 2.64* 2.34*  CALCIUM 9.5 8.8* 7.7* 8.0*   Urine analysis:    Component Value Date/Time   COLORURINE YELLOW 03/13/2017 1351   APPEARANCEUR CLEAR 03/13/2017 1351   LABSPEC  1.020 03/13/2017 1351   LABSPEC 1.010 09/19/2016 0908   PHURINE 5.5 03/13/2017 1351   GLUCOSEU TRACE (A) 03/13/2017 1351   GLUCOSEU Negative 09/19/2016 0908   HGBUR NEGATIVE 03/13/2017 1351   BILIRUBINUR Negative 09/19/2016 0908   KETONESUR NEGATIVE 03/13/2017 1351   PROTEINUR 2+ (A) 03/13/2017 1351   UROBILINOGEN 0.2 09/19/2016 0908   NITRITE Negative 09/19/2016 0908   NITRITE NEGATIVE 05/30/2016 1152   LEUKOCYTESUR Negative 09/19/2016 0908   Recent Results (from the past 240 hour(s))  Urine Culture     Status: None   Collection Time: 03/13/17  1:51 PM  Result Value Ref Range Status   MICRO NUMBER: [id number]  Final   SPECIMEN QUALITY: ADEQUATE  Final   Sample Source URINE  Final   STATUS: FINAL  Final   Result:   Final    Three or more organisms present, each greater than 10,000 cu/mL. May represent normal flora contamination from external genitalia. No further testing is required.  Blood culture (routine x 2)     Status: None (Preliminary result)   Collection Time: 03/15/17  8:42 PM  Result Value Ref Range Status   Specimen Description   Final    BLOOD RIGHT PORTA CATH Performed at [facility] [address], [place], [place] [address]    Special Requests   Final    BOTTLES DRAWN AEROBIC AND ANAEROBIC Blood Culture results may not be optimal due to an inadequate volume of blood received in culture bottles Performed at [facility] [address] [place], [place] [address]    Culture   Final    NO GROWTH < 24 HOURS Performed at [facility] [address] [place],  [address]    Report Status PENDING  Incomplete    Radiology Studies: Dg Chest 2 View Result Date: 03/15/2017 1. No active cardiopulmonary disease. 2. No evidence of pneumonia.   Scheduled Meds: . citalopram  40 mg Oral Daily  . famotidine  10 mg Oral BID  . feeding supplement (ENSURE ENLIVE)  237 mL Oral BID BM  . gabapentin  300 mg Oral QHS  .  heparin  5,000 Units Subcutaneous Q8H  . ipratropium-albuterol  3 mL Nebulization TID  . levothyroxine  75 mcg Oral QAC breakfast  . methylPREDNISolone (SOLU-MEDROL) injection  40 mg Intravenous Q12H  . montelukast  10 mg Oral Daily  . oseltamivir  30 mg Oral Daily  . senna  1 tablet Oral QHS   Continuous Infusions: . sodium chloride 75 mL/hr at 03/17/17 0536  LOS: 1 day   Time spent: 35 minutes   Faye Ramsay, MD [facility] Pager [phone]  If 7PM-7AM, please contact night-coverage [email or web address] Password [email or web address] 03/17/2017, 1:07 PM

## 2017-03-18 ENCOUNTER — Other Ambulatory Visit: Payer: Medicare Other

## 2017-03-18 DIAGNOSIS — J101 Influenza due to other identified influenza virus with other respiratory manifestations: Secondary | ICD-10-CM

## 2017-03-18 DIAGNOSIS — J189 Pneumonia, unspecified organism: Secondary | ICD-10-CM

## 2017-03-18 LAB — CBC WITH DIFFERENTIAL/PLATELET
Basophils Absolute: 0 10*3/uL (ref 0.0–0.1)
Basophils Relative: 0 %
EOS PCT: 0 %
Eosinophils Absolute: 0 10*3/uL (ref 0.0–0.7)
HEMATOCRIT: 29.7 % — AB (ref 36.0–46.0)
Hemoglobin: 9.7 g/dL — ABNORMAL LOW (ref 12.0–15.0)
LYMPHS ABS: 0.3 10*3/uL — AB (ref 0.7–4.0)
Lymphocytes Relative: 8 %
MCH: 37 pg — AB (ref 26.0–34.0)
MCHC: 32.7 g/dL (ref 30.0–36.0)
MCV: 113.4 fL — AB (ref 78.0–100.0)
MONO ABS: 0.3 10*3/uL (ref 0.1–1.0)
MONOS PCT: 8 %
NEUTROS PCT: 84 %
Neutro Abs: 3.2 10*3/uL (ref 1.7–7.7)
PLATELETS: 85 10*3/uL — AB (ref 150–400)
RBC: 2.62 MIL/uL — AB (ref 3.87–5.11)
RDW: 14.5 % (ref 11.5–15.5)
WBC: 3.8 10*3/uL — AB (ref 4.0–10.5)

## 2017-03-18 LAB — BASIC METABOLIC PANEL
ANION GAP: 11 (ref 5–15)
BUN: 27 mg/dL — ABNORMAL HIGH (ref 6–20)
CO2: 21 mmol/L — AB (ref 22–32)
Calcium: 8.6 mg/dL — ABNORMAL LOW (ref 8.9–10.3)
Chloride: 114 mmol/L — ABNORMAL HIGH (ref 101–111)
Creatinine, Ser: 2.2 mg/dL — ABNORMAL HIGH (ref 0.44–1.00)
GFR calc Af Amer: 24 mL/min — ABNORMAL LOW (ref >60)
GFR calc non Af Amer: 21 mL/min — ABNORMAL LOW (ref >60)
GLUCOSE: 131 mg/dL — AB (ref 65–99)
POTASSIUM: 4.4 mmol/L (ref 3.5–5.1)
Sodium: 146 mmol/L — ABNORMAL HIGH (ref 135–145)

## 2017-03-18 LAB — LEGIONELLA PNEUMOPHILA SEROGP 1 UR AG: L. pneumophila Serogp 1 Ur Ag: NEGATIVE

## 2017-03-18 LAB — VITAMIN B12: Vitamin B-12: 192 pg/mL (ref 180–914)

## 2017-03-18 MED ORDER — IPRATROPIUM-ALBUTEROL 0.5-2.5 (3) MG/3ML IN SOLN
3.0000 mL | Freq: Two times a day (BID) | RESPIRATORY_TRACT | Status: DC
  Administered 2017-03-18 – 2017-03-19 (×2): 3 mL via RESPIRATORY_TRACT
  Filled 2017-03-18 (×2): qty 3

## 2017-03-18 NOTE — Progress Notes (Signed)
Nutrition Brief Note  Patient identified on the Malnutrition Screening Tool (MST) Report  Pt with overall increased weight. Pt consuming 75-100% of meals at this time. Breakfast provided ~650 kcal and 17g protein).  Wt Readings from Last 15 Encounters:  03/16/17 176 lb 2.4 oz (79.9 kg)  03/13/17 169 lb (76.7 kg)  01/24/17 178 lb 9.6 oz (81 kg)  10/18/16 170 lb (77.1 kg)  10/16/16 172 lb 4.8 oz (78.2 kg)  09/18/16 159 lb 1.6 oz (72.2 kg)  08/20/16 161 lb 1.6 oz (73.1 kg)  07/23/16 162 lb 4.8 oz (73.6 kg)  07/16/16 158 lb (71.7 kg)  07/05/16 160 lb (72.6 kg)  06/25/16 157 lb 8 oz (71.4 kg)  06/18/16 155 lb 3.2 oz (70.4 kg)  05/30/16 163 lb (73.9 kg)  05/21/16 156 lb 3.2 oz (70.9 kg)  05/07/16 153 lb 4.8 oz (69.5 kg)    Body mass index is 29.31 kg/m. Patient meets criteria for overweight based on current BMI.   Current diet order is Heart Healthy, patient is consuming approximately 75-100% of meals at this time. Labs and medications reviewed.   No nutrition interventions warranted at this time. If nutrition issues arise, please consult RD.   [name] Bibles, MS, RD, [facility] Pager: [phone] After Hours Pager: [phone]

## 2017-03-18 NOTE — Evaluation (Signed)
Physical Therapy Evaluation Patient Details Name: Nancy Norris MRN: [id number] DOB: [birth year] Today's Date: 03/18/2017   History of Present Illness  76 y.o. female, with past medical history significant for breast cancer and recently diagnosed multiple myeloma followed by Dr. Earlie Norris and not on treatment yet presenting with a few days history of weakness, cough, tachypnea and decreased appetite. Dx of flu, PNA, dehydration, weakness.   Clinical Impression  Pt is independent with mobility, she ambulated 200' without loss of balance, SaO2 97% on room air, 3/4 dyspnea. PT will not follow, will sign off. Encouraged pt to ambulate TID to minimize deconditioning.        Follow Up Recommendations No PT follow up    Equipment Recommendations  None recommended by PT    Recommendations for Other Services       Precautions / Restrictions Precautions Precautions: None Restrictions Weight Bearing Restrictions: No      Mobility  Bed Mobility Overal bed mobility: Independent                Transfers Overall transfer level: Independent Equipment used: None                Ambulation/Gait Ambulation/Gait assistance: Independent Ambulation Distance (Feet): 200 Feet Assistive device: None(held IV pole for 150', then no AD for 50') Gait Pattern/deviations: WFL(Within Functional Limits)     General Gait Details: steady, no loss of balance, 3/4 dyspnea, SaO2 97% on room air while walking  Stairs            Wheelchair Mobility    Modified Rankin (Stroke Patients Only)       Balance Overall balance assessment: Independent                                           Pertinent Vitals/Pain Pain Assessment: No/denies pain    Home Living Family/patient expects to be discharged to:: Private residence Living Arrangements: Alone Available Help at Discharge: Neighbor;Available PRN/intermittently Type of Home: Apartment Home Access: Ramped  entrance     Home Layout: One level Home Equipment: Walker - 2 wheels;Walker - 4 wheels;Shower seat;Cane - single point      Prior Function Level of Independence: Independent         Comments: independent ADLs, walks without AD     Hand Dominance        Extremity/Trunk Assessment   Upper Extremity Assessment Upper Extremity Assessment: Overall WFL for tasks assessed; h/o peripheral neuropathy B feet (chemo induced)    Lower Extremity Assessment Lower Extremity Assessment: Overall WFL for tasks assessed    Cervical / Trunk Assessment Cervical / Trunk Assessment: Normal  Communication   Communication: No difficulties  Cognition Arousal/Alertness: Awake/alert Behavior During Therapy: WFL for tasks assessed/performed Overall Cognitive Status: Within Functional Limits for tasks assessed                                        General Comments      Exercises     Assessment/Plan    PT Assessment Patent does not need any further PT services  PT Problem List         PT Treatment Interventions      PT Goals (Current goals can be found in the Care Plan section)  Acute Rehab PT  Goals Patient Stated Goal: return to living independently, likes to play computer games that help her memory PT Goal Formulation: All assessment and education complete, DC therapy    Frequency     Barriers to discharge        Co-evaluation               AM-PAC PT "6 Clicks" Daily Activity  Outcome Measure Difficulty turning over in bed (including adjusting bedclothes, sheets and blankets)?: None Difficulty moving from lying on back to sitting on the side of the bed? : None Difficulty sitting down on and standing up from a chair with arms (e.g., wheelchair, bedside commode, etc,.)?: None Help needed moving to and from a bed to chair (including a wheelchair)?: None Help needed walking in hospital room?: None Help needed climbing 3-5 steps with a railing? :  None 6 Click Score: 24    End of Session   Activity Tolerance: Patient tolerated treatment well Patient left: in bed;with call bell/phone within reach Nurse Communication: Mobility status      Time: 4827-0786 PT Time Calculation (min) (ACUTE ONLY): 16 min   Charges:   PT Evaluation $PT Eval Low Complexity: 1 Low     PT G Codes:          Nancy Norris 03/18/2017, 11:29 AM [phone]

## 2017-03-18 NOTE — Progress Notes (Signed)
[facility] PROGRESS NOTE  Nancy Norris QIH:[id number] DOB: 1940-12-24 DOA: 03/15/2017 PCP: [name], MD  Brief summary   77 y.o. female, with past medical history significant for breast cancer,  multiple myeloma s/p stem cell transplant complicated with ckd,  followed by Dr. Earlie Server,  presenting with a few days history of weakness, cough, tachypnea and decreased appetite.  Patient did not take her medications as well.  She saw her primary medical doctor on Wednesday and was started on doxycycline and prednisone.  In the emergency room the patient was noted to be tach tachypneic, leukopenic with elevated lactate at 3.4 . flu A was positive .  Her creatinine was elevated but not that much different from baseline    Assessment/Plan:  Acute reports failure due to Influenza A, and superimposed bacterial PNA. Cont empiric Zithro and rocephin for now, continue bronchodilators scheduled and as needed, continue Tamiflu,  continue Solumedrol and suspect we can try to taper down in next 24 -48 hours   Previous h/o cryptococcal pneumonia. she has been seen ID doctor Dr. Linus Salmons. Recommended to repeat chest ct after the resolution of influenza and community acquired pneumonia.   Pancytopenia. Thrombocytopenia. possibly reactive and in the setting of known MM. Vs underlying MDS. will continue to monitor counts while inpatient. Check b12, folate    Acute kidney injury imposed on CKD stage III. baseline Cr ~2.4. up on this admission and likely from pre renal etiology. Improved with IVF have been provided and Cr is trending down  Hypernatremia. IVF changed to 1/2 NS and Na is now WNL   Hypothyroidism. continue home regimen with synthroid  MM. sees Dr. Julien Nordmann, I have notified him via EPIC of pt's admission. pt says she was supposed to have specific blood work done in an order to start treatment next week. Consulted Dr Julien Nordmann as we can likely order it here while inpatient     Code  Status: full Family Communication: d/w patient, RN (indicate person spoken with, relationship, and if by phone, the number) Disposition Plan: home 24-48 hrs    Consultants: Hematology   Procedures:  none  Antibiotics: Anti-infectives (From admission, onward)   Start     Dose/Rate Route Frequency Ordered Stop   03/17/17 1500  azithromycin (ZITHROMAX) 500 mg in sodium chloride 0.9 % 250 mL IVPB     500 mg 250 mL/hr over 60 Minutes Intravenous Every 24 hours 03/17/17 1322     03/17/17 1400  cefTRIAXone (ROCEPHIN) 1 g in sodium chloride 0.9 % 100 mL IVPB     1 g 200 mL/hr over 30 Minutes Intravenous Every 24 hours 03/17/17 1328     03/16/17 1000  oseltamivir (TAMIFLU) capsule 30 mg     30 mg Oral Daily 03/16/17 0206 03/21/17 0959   03/15/17 2330  oseltamivir (TAMIFLU) capsule 30 mg     30 mg Oral  Once 03/15/17 2326 03/16/17 0029   03/15/17 2130  vancomycin (VANCOCIN) 1,500 mg in sodium chloride 0.9 % 500 mL IVPB     1,500 mg 250 mL/hr over 120 Minutes Intravenous  Once 03/15/17 2125 03/16/17 0212   03/15/17 2130  ceFEPIme (MAXIPIME) 1 g in sodium chloride 0.9 % 100 mL IVPB     1 g 200 mL/hr over 30 Minutes Intravenous  Once 03/15/17 2126 03/15/17 2224   03/15/17 2115  ceFEPIme (MAXIPIME) 2 g in sodium chloride 0.9 % 100 mL IVPB  Status:  Discontinued     2 g 200 mL/hr over 30  Minutes Intravenous  Once 03/15/17 2110 03/15/17 2126   03/15/17 2030  cefTRIAXone (ROCEPHIN) 2 g in sodium chloride 0.9 % 100 mL IVPB  Status:  Discontinued     2 g 200 mL/hr over 30 Minutes Intravenous  Once 03/15/17 2028 03/15/17 2110        (indicate start date, and stop date if known)  HPI/Subjective: Alert, reports cough. No chest pains   Objective: Vitals:   03/18/17 0919 03/18/17 0922  BP:    Pulse:    Resp:    Temp:    SpO2: 94% 94%    Intake/Output Summary (Last 24 hours) at 03/18/2017 1200 Last data filed at 03/18/2017 0315 Gross per 24 hour  Intake 1869.17 ml  Output 800 ml  Net  1069.17 ml   Filed Weights   03/15/17 1619 03/16/17 0151  Weight: 76.7 kg (169 lb) 79.9 kg (176 lb 2.4 oz)    Exam:   General:  No distress   Cardiovascular: s1,s2 rrr  Respiratory: few rales LL  Abdomen: soft, nt, nd   Musculoskeletal: no leg edema    Data Reviewed: Basic Metabolic Panel: Recent Labs  Lab 03/13/17 1212 03/15/17 2042 03/16/17 1015 03/17/17 0506 03/18/17 0514  NA 144 141 146* 140 146*  K 4.6 4.1 3.5 4.1 4.4  CL 108 108 116* 111 114*  CO2 26 21* 21* 21* 21*  GLUCOSE 126* 241* 144* 156* 131*  BUN 30* 34* 27* 23* 27*  CREATININE 3.39* 2.98* 2.64* 2.34* 2.20*  CALCIUM 9.5 8.8* 7.7* 8.0* 8.6*   Liver Function Tests: No results for input(s): AST, ALT, ALKPHOS, BILITOT, PROT, ALBUMIN in the last 168 hours. No results for input(s): LIPASE, AMYLASE in the last 168 hours. No results for input(s): AMMONIA in the last 168 hours. CBC: Recent Labs  Lab 03/13/17 1212 03/15/17 2042 03/17/17 0506 03/18/17 0514  WBC 4.6 2.5* 3.3* 3.8*  NEUTROABS 2,650 2.1  --  3.2  HGB 11.7 10.6* 9.1* 9.7*  HCT 34.2* 32.2* 28.1* 29.7*  MCV 106.2* 112.2* 113.3* 113.4*  PLT 56* 85* 78* 85*   Cardiac Enzymes: No results for input(s): CKTOTAL, CKMB, CKMBINDEX, TROPONINI in the last 168 hours. BNP (last 3 results) Recent Labs    03/13/17 1212  BNP 77    ProBNP (last 3 results) No results for input(s): PROBNP in the last 8760 hours.  CBG: No results for input(s): GLUCAP in the last 168 hours.  Recent Results (from the past 240 hour(s))  Urine Culture     Status: None   Collection Time: 03/13/17  1:51 PM  Result Value Ref Range Status   MICRO NUMBER: [id number]  Final   SPECIMEN QUALITY: ADEQUATE  Final   Sample Source URINE  Final   STATUS: FINAL  Final   Result:   Final    Three or more organisms present, each greater than 10,000 cu/mL. May represent normal flora contamination from external genitalia. No further testing is required.  Blood culture (routine x 2)      Status: None (Preliminary result)   Collection Time: 03/15/17  8:42 PM  Result Value Ref Range Status   Specimen Description   Final    BLOOD RIGHT PORTA CATH Performed at [facility] [address] [place], [place] [address]    Special Requests   Final    BOTTLES DRAWN AEROBIC AND ANAEROBIC Blood Culture results may not be optimal due to an inadequate volume of blood received in culture bottles Performed at [facility]  [place] [address] [place], [place] [address]    Culture   Final    NO GROWTH 2 DAYS Performed at [facility] [address] [place], [place] [address]    Report Status PENDING  Incomplete  Blood culture (routine x 2)     Status: None (Preliminary result)   Collection Time: 03/15/17  9:29 PM  Result Value Ref Range Status   Specimen Description   Final    BLOOD RIGHT ANTECUBITAL Performed at [facility] [address] [place], [place] [address]    Special Requests   Final    IN PEDIATRIC BOTTLE Blood Culture adequate volume Performed at [facility] [address] [place], [place] [address]    Culture   Final    NO GROWTH 1 DAY Performed at [facility], [place] [address] [place], [place] [address]    Report Status PENDING  Incomplete     Studies: Ct Chest Wo Contrast  Result Date: 03/17/2017 CLINICAL DATA:  Pt with hx of breast cancer and recently diagnosed with MM follows with Dr. Julien Nordmann, presented with several days duration of progressively worsening fatigue, poor oral intake, dyspnea with exertion as well as at rest. Flu A positive EXAM: CT CHEST WITHOUT CONTRAST TECHNIQUE: Multidetector CT imaging of the chest was performed following the standard protocol without IV contrast. COMPARISON:  Chest radiograph, 03/15/2017.  Chest CT, 10/08/2016. FINDINGS: Cardiovascular: Heart is normal in size and configuration. There are moderate three-vessel coronary artery  calcifications. Thoracic aorta is normal caliber. Mild atherosclerotic calcifications are noted along the arch and descending portion. Main pulmonary artery is mildly dilated to 3.6 cm. Mediastinum/Nodes: No neck base or axillary masses or enlarged lymph nodes. No mediastinal or hilar masses or adenopathy. Trachea is patent. Esophagus is unremarkable other than a small hiatal hernia. Lungs/Pleura: Patchy airspace consolidation is noted in the left lower lobe in a peribronchovascular distribution. There is dependent opacity in the right lower lobe. A smaller area of dependent opacity is noted in the left upper lobe lingula. There is a small focus irregular nodular opacity in the right upper lobe centered on image 49, series 5. Largest nodule measures 6 mm. Small nodule noted in the right middle lobe anteriorly, image 101, 3-4 mm. 3 mm nodule in the lateral right lower lobe, image 119. No evidence of pulmonary edema. No pneumothorax or pleural effusion. Mild centrilobular emphysema is noted in the upper lobes. Upper Abdomen: No acute findings. Bilateral renal cortical thinning. Musculoskeletal: No fracture or acute finding. No osteoblastic or osteolytic lesions. IMPRESSION: 1. Mild patchy areas of left lower lobe consolidation are noted consistent with pneumonia. Additional dependent opacity in the right lower lobe and left upper lobe lingula is more likely atelectasis. Small area of nodular opacities in the right upper lobe is also likely infectious in etiology. There are 2 small nodules, 1 in the right middle lobe and the other in the right lower lobe, that are stable from the prior chest CT. Compared to the prior chest CT, the area of nodular opacity right upper lobe, the patchy airspace consolidation in the left lower lobe and the right lower lobe dependent opacity, felt most likely to be atelectasis, are also new. 2. No other acute abnormalities. 3. Mild dilation of the main pulmonary artery. Consider a component  of pulmonary hypertension. 4. Aortic atherosclerosis. Aortic Atherosclerosis (ICD10-I70.0) and Emphysema (ICD10-J43.9). Electronically Signed   By: Dedra Skeens.D.  On: 03/17/2017 12:56    Scheduled Meds: . citalopram  40 mg Oral Daily  . famotidine  10 mg Oral BID  . feeding supplement (ENSURE ENLIVE)  237 mL Oral BID BM  . gabapentin  300 mg Oral QHS  . heparin  5,000 Units Subcutaneous Q8H  . ipratropium-albuterol  3 mL Nebulization BID  . levothyroxine  75 mcg Oral QAC breakfast  . methylPREDNISolone (SOLU-MEDROL) injection  40 mg Intravenous Q12H  . montelukast  10 mg Oral Daily  . oseltamivir  30 mg Oral Daily  . senna  1 tablet Oral QHS   Continuous Infusions: . sodium chloride 50 mL/hr at 03/18/17 0315  . azithromycin Stopped (03/17/17 1728)  . cefTRIAXone (ROCEPHIN)  IV Stopped (03/17/17 1443)    Active Problems:   Influenza    Time spent: >35 minutes     Kinnie Feil  [facility] Pager [phone]. If 7PM-7AM, please contact night-coverage at [email or web address], password [facility] 03/18/2017, 12:00 PM  LOS: 2 days

## 2017-03-19 DIAGNOSIS — A419 Sepsis, unspecified organism: Secondary | ICD-10-CM

## 2017-03-19 MED ORDER — AZITHROMYCIN 250 MG PO TABS
500.0000 mg | ORAL_TABLET | Freq: Every day | ORAL | Status: DC
  Administered 2017-03-19: 500 mg via ORAL
  Filled 2017-03-19: qty 2

## 2017-03-19 MED ORDER — VITAMIN B-12 1000 MCG PO TABS: 1000.0000 ug | ORAL_TABLET | Freq: Every day | ORAL | 0 refills | Status: AC

## 2017-03-19 MED ORDER — HEPARIN SOD (PORK) LOCK FLUSH 100 UNIT/ML IV SOLN
500.0000 [IU] | INTRAVENOUS | Status: DC | PRN
  Filled 2017-03-19: qty 5

## 2017-03-19 MED ORDER — OSELTAMIVIR PHOSPHATE 30 MG PO CAPS: 30.0000 mg | ORAL_CAPSULE | Freq: Every day | ORAL | 0 refills | Status: DC

## 2017-03-19 MED ORDER — CEFPODOXIME PROXETIL 100 MG PO TABS: 100.0000 mg | ORAL_TABLET | Freq: Two times a day (BID) | ORAL | 0 refills | Status: DC

## 2017-03-19 MED ORDER — FOLIC ACID 1 MG PO TABS: 1.0000 mg | ORAL_TABLET | Freq: Every day | ORAL | 3 refills | Status: DC

## 2017-03-19 NOTE — Care Management Important Message (Signed)
Important Message  Patient Details IM Letter given to Nora/Case Manager to present to the Patient Name: Nancy Norris MRN: [id number] Date of Birth: [birth year]   Medicare Important Message Given:  Yes    Kerin Salen 03/19/2017, 12:03 [place] Message  Patient Details  Name: Nancy Norris MRN: [id number] Date of Birth: [birth year]   Medicare Important Message Given:  Yes    Kerin Salen 03/19/2017, 12:03 PM

## 2017-03-19 NOTE — Progress Notes (Signed)
PHARMACIST - PHYSICIAN COMMUNICATION DR:   Daleen Bo CONCERNING: Antibiotic IV to Oral Route Change Policy  RECOMMENDATION: This patient is receiving zithromax by the intravenous route.  Based on criteria approved by the Pharmacy and Therapeutics Committee, the antibiotic(s) is/are being converted to the equivalent oral dose form(s).   DESCRIPTION: These criteria include:  Patient being treated for a respiratory tract infection, urinary tract infection, cellulitis or clostridium difficile associated diarrhea if on metronidazole  The patient is not neutropenic and does not exhibit a GI malabsorption state  The patient is eating (either orally or via tube) and/or has been taking other orally administered medications for a least 24 hours  The patient is improving clinically and has a Tmax < 100.5  If you have questions about this conversion, please contact the Pharmacy Department  []   [phone] )  [facility] []   [phone] )  [facility] []   [phone] )  [facility] []   [phone] )  [facility] [x]   [phone] )  [place], PharmD, [place] Pager [phone] 03/19/2017 8:37 AM

## 2017-03-19 NOTE — Progress Notes (Signed)
Pt discharged home with daughter in stable condition. Discharge instructions given. Scripts sent to pharmacy of choice. No immediate questions or concerns at this time. Discharged from unit via wheelchair

## 2017-03-19 NOTE — Discharge Summary (Addendum)
Physician Discharge Summary  Nancy Norris JIR:[id number] DOB: 02-20-1940 DOA: 03/15/2017  PCP: [name], MD  Admit date: 03/15/2017 Discharge date: 03/19/2017  Time spent: >35 minutes  Recommendations for Outpatient Follow-up:  F/u with PCP in 3-5 days F/u with hematology in 1 week  F/u with ID in 3-4 weeks   Discharge Diagnoses:  Active Problems:   Influenza   Discharge Condition: stable   Diet recommendation: low sodium  Filed Weights   03/15/17 1619 03/16/17 0151  Weight: 76.7 kg (169 lb) 79.9 kg (176 lb 2.4 oz)    History of present illness:    77 y.o.female,with past medical history significant for breast cancer,  multiple myeloma s/p stem cell transplant complicated with ckd,  followed by Dr. Earlie Server,  presenting with a few days history of weakness, cough, tachypnea and decreased appetite. Patient did not take her medications as well. She saw her primary medical doctor on Wednesday and was started on doxycycline and prednisone. In the emergency room the patient was noted to be tach tachypneic, leukopenic with elevated lactate at 3.4 .fluAwas positive .Her creatinine was elevated but not that much different from baseline    Hospital Course:   Acute reports failure due to Influenza A, and superimposed bacterial PNA. Improved with empiric Zithro and rocephin, bronchodilators, Tamiflu. Then transitioned to oral antibiotic regimen to complete treatment course.   -Previous h/o cryptococcal pneumonia. she has been seen ID doctor Dr. Linus Salmons. Recommended to repeat chest ct after the resolution of influenza and community acquired pneumonia.   Pancytopenia. Thrombocytopenia. possibly reactive and in the setting of known MM. Vs underlying MDS. b12, folate. Patient will f/u with hem/oncology next Monday   Acute kidney injury imposed on CKD stage III. baseline Cr ~2.4. up on this admission and likely from pre renal etiology. Improved with IVF have been provided and  Cr is trending down  Hypernatremia. IVF changed to 1/2 NS and Na is now WNL   Hypothyroidism. continue home regimen with synthroid  MM. sees Dr. Julien Nordmann. Has f/u appointment next Monday. D/w Dr. Mckinley Jewel     Procedures:  none (i.e. Studies not automatically included, echos, thoracentesis, etc; not x-rays)  Consultations:  none  Discharge Exam: Vitals:   03/19/17 0610 03/19/17 0922  BP: 130/62   Pulse: 68   Resp: 16   Temp: 98.4 F (36.9 C)   SpO2: 96% 98%    General: alert. No distress  Cardiovascular: s1,s2 rrr Respiratory: CTA BL  Discharge Instructions  Discharge Instructions    Diet - low sodium heart healthy   Complete by:  As directed    Discharge instructions   Complete by:  As directed    Please follow up with primary care doctor in 3-7 days   Increase activity slowly   Complete by:  As directed      Allergies as of 03/19/2017      Reactions   Codeine Anaphylaxis   Latex Shortness Of Breath   Adhesive products    Other Other (See Comments)   Onion, chocolate causes migraines   Onion Other (See Comments)   Causes migraine headaches   Zyprexa [olanzapine] Other (See Comments)   Confusion , dizzy,unsteady   Adhesive [tape] Other (See Comments)   blisters   Hydrocodone Rash   With extreme itching   Iodinated Diagnostic Agents Hives, Itching, Rash   Happened 60 years ago Stage IV kidney function   Sulfa Antibiotics Itching      Medication List    STOP  taking these medications   fluconazole 200 MG tablet Commonly known as:  DIFLUCAN     TAKE these medications   acetaminophen 325 MG tablet Commonly known as:  TYLENOL Take 650 mg by mouth every 6 (six) hours as needed for mild pain or moderate pain.   albuterol 108 (90 Base) MCG/ACT inhaler Commonly known as:  PROVENTIL HFA;VENTOLIN HFA Inhale 1-2 puffs into the lungs every 6 (six) hours as needed for wheezing or shortness of breath (cough).   benzonatate 100 MG capsule Commonly  known as:  TESSALON PERLES Take 2 capsules (200 mg total) by mouth 3 (three) times daily as needed for cough (Max: '600mg'$  per day).   butalbital-acetaminophen-caffeine 50-325-40 MG tablet Commonly known as:  FIORICET, ESGIC Take 1 tablet by mouth every 4 (four) hours as needed.   cefpodoxime 100 MG tablet Commonly known as:  VANTIN Take 1 tablet (100 mg total) by mouth 2 (two) times daily.   Cholecalciferol 4000 units Tabs Take 4,000 Units by mouth daily.   citalopram 40 MG tablet Commonly known as:  CELEXA TAKE 1 TABLET BY MOUTH DAILY OR AS DIRECTED What changed:  See the new instructions.   cromolyn 4 % ophthalmic solution Commonly known as:  OPTICROM Place 1 drop into both eyes 4 (four) times daily as needed (irritation).   dexamethasone 4 MG tablet Commonly known as:  DECADRON Take 1 tablet (4 mg total) by mouth daily. Take 10 tablets by mouth once a week as directed.   doxycycline 100 MG capsule Commonly known as:  VIBRAMYCIN Take 1 capsule 2 x/day with food for 10 days   folic acid 1 MG tablet Commonly known as:  FOLVITE Take 1 tablet (1 mg total) by mouth daily.   furosemide 40 MG tablet Commonly known as:  LASIX TAKE 1 TABLET BY MOUTH DAILY. What changed:    how much to take  how to take this  when to take this   gabapentin 300 MG capsule Commonly known as:  NEURONTIN TAKE 1 CAPSULE BY MOUTH AT BEDTIME.   levothyroxine 150 MCG tablet Commonly known as:  SYNTHROID, LEVOTHROID Take 1 tablet daily - need office visit before further refills   loperamide 2 MG capsule Commonly known as:  IMODIUM Take 1 capsule (2 mg total) by mouth as needed for diarrhea or loose stools.   magic mouthwash Soln Take 5 mLs by mouth 4 (four) times daily as needed for mouth pain.   montelukast 10 MG tablet Commonly known as:  SINGULAIR TAKE 1 TABLET BY MOUTH DAILY.   nitroGLYCERIN 0.4 MG SL tablet Commonly known as:  NITROSTAT Place 0.4 mg under the tongue every 5  (five) minutes as needed for chest pain. Reported on 03/30/2015   nystatin powder Commonly known as:  MYCOSTATIN/NYSTOP Apply topically 2 (two) times daily. What changed:    how much to take  when to take this  reasons to take this   oseltamivir 30 MG capsule Commonly known as:  TAMIFLU Take 1 capsule (30 mg total) by mouth daily.   pilocarpine 5 MG tablet Commonly known as:  SALAGEN TAKE 1 TABLET BY MOUTH 3 TIMES DAILY FOR DRY MOUTH.   predniSONE 20 MG tablet Commonly known as:  DELTASONE 2 tablets daily for 3 days, 1 tablet daily for 4 days.   PRESCRIPTION MEDICATION Pt gets chemo at [facility].   promethazine-dextromethorphan 6.25-15 MG/5ML syrup Commonly known as:  PROMETHAZINE-DM Take 5 mLs by mouth 4 (four) times daily as needed for cough.  ranitidine 300 MG tablet Commonly known as:  ZANTAC TAKE 1 TO 2 TABLETS DAILY FOR HEARTBURN & REFLUX   senna 8.6 MG tablet Commonly known as:  SENOKOT Take 1 tablet by mouth at bedtime.   traMADol 50 MG tablet Commonly known as:  ULTRAM Take 1 to 2 tablets by mouth 4 times a day as needed for pain. What changed:    how much to take  how to take this  when to take this  reasons to take this  additional instructions   vitamin B-12 1000 MCG tablet Commonly known as:  CYANOCOBALAMIN Take 1 tablet (1,000 mcg total) by mouth daily.   vitamin C 250 MG tablet Commonly known as:  ASCORBIC ACID Take 250 mg by mouth daily.      Allergies  Allergen Reactions  . Codeine Anaphylaxis  . Latex Shortness Of Breath    Adhesive products   . Other Other (See Comments)    Onion, chocolate causes migraines  . Onion Other (See Comments)    Causes migraine headaches  . Zyprexa [Olanzapine] Other (See Comments)    Confusion , dizzy,unsteady  . Adhesive [Tape] Other (See Comments)    blisters  . Hydrocodone Rash    With extreme itching  . Iodinated Diagnostic Agents Hives, Itching and Rash    Happened 60 years ago Stage IV  kidney function  . Sulfa Antibiotics Itching      The results of significant diagnostics from this hospitalization (including imaging, microbiology, ancillary and laboratory) are listed below for reference.    Significant Diagnostic Studies: Dg Chest 2 View  Result Date: 03/15/2017 CLINICAL DATA:  Patient states she was diagnosed with pneumonia two days ago and symptoms have increased even with medication. EXAM: CHEST  2 VIEW COMPARISON:  01/24/2017 and 09/19/2016 FINDINGS: Cardiac silhouette is normal in size. No mediastinal or hilar masses. No evidence of adenopathy. Relative increased opacity projects within the left mid lung, which appears to be due to asymmetry of the overlying breast tissue. There is no evidence of lung consolidation. Mild reticular opacities are noted at the lung bases consistent with scarring and/or subsegmental atelectasis. Lungs are otherwise clear. No pleural effusion or pneumothorax. Right internal jugular Port-A-Cath is stable. Skeletal structures are intact. Stable changes from previous left breast surgery. IMPRESSION: 1. No active cardiopulmonary disease. 2. No evidence of pneumonia. Electronically Signed   By: Lajean Manes M.D.   On: 03/15/2017 16:59   Ct Chest Wo Contrast  Result Date: 03/17/2017 CLINICAL DATA:  Pt with hx of breast cancer and recently diagnosed with MM follows with Dr. Julien Nordmann, presented with several days duration of progressively worsening fatigue, poor oral intake, dyspnea with exertion as well as at rest. Flu A positive EXAM: CT CHEST WITHOUT CONTRAST TECHNIQUE: Multidetector CT imaging of the chest was performed following the standard protocol without IV contrast. COMPARISON:  Chest radiograph, 03/15/2017.  Chest CT, 10/08/2016. FINDINGS: Cardiovascular: Heart is normal in size and configuration. There are moderate three-vessel coronary artery calcifications. Thoracic aorta is normal caliber. Mild atherosclerotic calcifications are noted along the  arch and descending portion. Main pulmonary artery is mildly dilated to 3.6 cm. Mediastinum/Nodes: No neck base or axillary masses or enlarged lymph nodes. No mediastinal or hilar masses or adenopathy. Trachea is patent. Esophagus is unremarkable other than a small hiatal hernia. Lungs/Pleura: Patchy airspace consolidation is noted in the left lower lobe in a peribronchovascular distribution. There is dependent opacity in the right lower lobe. A smaller area of dependent  opacity is noted in the left upper lobe lingula. There is a small focus irregular nodular opacity in the right upper lobe centered on image 49, series 5. Largest nodule measures 6 mm. Small nodule noted in the right middle lobe anteriorly, image 101, 3-4 mm. 3 mm nodule in the lateral right lower lobe, image 119. No evidence of pulmonary edema. No pneumothorax or pleural effusion. Mild centrilobular emphysema is noted in the upper lobes. Upper Abdomen: No acute findings. Bilateral renal cortical thinning. Musculoskeletal: No fracture or acute finding. No osteoblastic or osteolytic lesions. IMPRESSION: 1. Mild patchy areas of left lower lobe consolidation are noted consistent with pneumonia. Additional dependent opacity in the right lower lobe and left upper lobe lingula is more likely atelectasis. Small area of nodular opacities in the right upper lobe is also likely infectious in etiology. There are 2 small nodules, 1 in the right middle lobe and the other in the right lower lobe, that are stable from the prior chest CT. Compared to the prior chest CT, the area of nodular opacity right upper lobe, the patchy airspace consolidation in the left lower lobe and the right lower lobe dependent opacity, felt most likely to be atelectasis, are also new. 2. No other acute abnormalities. 3. Mild dilation of the main pulmonary artery. Consider a component of pulmonary hypertension. 4. Aortic atherosclerosis. Aortic Atherosclerosis (ICD10-I70.0) and Emphysema  (ICD10-J43.9). Electronically Signed   By: Lajean Manes M.D.   On: 03/17/2017 12:56    Microbiology: Recent Results (from the past 240 hour(s))  Urine Culture     Status: None   Collection Time: 03/13/17  1:51 PM  Result Value Ref Range Status   MICRO NUMBER: [id number]  Final   SPECIMEN QUALITY: ADEQUATE  Final   Sample Source URINE  Final   STATUS: FINAL  Final   Result:   Final    Three or more organisms present, each greater than 10,000 cu/mL. May represent normal flora contamination from external genitalia. No further testing is required.  Blood culture (routine x 2)     Status: None (Preliminary result)   Collection Time: 03/15/17  8:42 PM  Result Value Ref Range Status   Specimen Description   Final    BLOOD RIGHT PORTA CATH Performed at [facility] [address] [place], [place] [address]    Special Requests   Final    BOTTLES DRAWN AEROBIC AND ANAEROBIC Blood Culture results may not be optimal due to an inadequate volume of blood received in culture bottles Performed at [facility] [address], [place], [place] [address]    Culture   Final    NO GROWTH 3 DAYS Performed at [facility], [place] [address] [place], [place] [address]    Report Status PENDING  Incomplete  Blood culture (routine x 2)     Status: None (Preliminary result)   Collection Time: 03/15/17  9:29 PM  Result Value Ref Range Status   Specimen Description   Final    BLOOD RIGHT ANTECUBITAL Performed at [facility] [address], [place], [place] [address]    Special Requests   Final    IN PEDIATRIC BOTTLE Blood Culture adequate volume Performed at [facility] [address], [place], [place] [address]    Culture   Final    NO GROWTH 2 DAYS Performed at [facility] [address] [place], [place] [address]    Report Status  PENDING  Incomplete     Labs: Basic Metabolic Panel: Recent Labs  Lab  03/13/17 1212 03/15/17 2042 03/16/17 1015 03/17/17 0506 03/18/17 0514  NA 144 141 146* 140 146*  K 4.6 4.1 3.5 4.1 4.4  CL 108 108 116* 111 114*  CO2 26 21* 21* 21* 21*  GLUCOSE 126* 241* 144* 156* 131*  BUN 30* 34* 27* 23* 27*  CREATININE 3.39* 2.98* 2.64* 2.34* 2.20*  CALCIUM 9.5 8.8* 7.7* 8.0* 8.6*   Liver Function Tests: No results for input(s): AST, ALT, ALKPHOS, BILITOT, PROT, ALBUMIN in the last 168 hours. No results for input(s): LIPASE, AMYLASE in the last 168 hours. No results for input(s): AMMONIA in the last 168 hours. CBC: Recent Labs  Lab 03/13/17 1212 03/15/17 2042 03/17/17 0506 03/18/17 0514  WBC 4.6 2.5* 3.3* 3.8*  NEUTROABS 2,650 2.1  --  3.2  HGB 11.7 10.6* 9.1* 9.7*  HCT 34.2* 32.2* 28.1* 29.7*  MCV 106.2* 112.2* 113.3* 113.4*  PLT 56* 85* 78* 85*   Cardiac Enzymes: No results for input(s): CKTOTAL, CKMB, CKMBINDEX, TROPONINI in the last 168 hours. BNP: BNP (last 3 results) Recent Labs    03/13/17 1212  BNP 77    ProBNP (last 3 results) No results for input(s): PROBNP in the last 8760 hours.  CBG: No results for input(s): GLUCAP in the last 168 hours.     SignedKinnie Feil  [facility] 03/19/2017, 10:30 AM  Addendum: Sepsis ruled out Teniola Tseng N

## 2017-03-20 LAB — FOLATE RBC: Folate, Hemolysate: 297.3 ng/mL

## 2017-03-20 LAB — HEMATOLOGY COMMENTS:

## 2017-03-20 LAB — CULTURE, BLOOD (ROUTINE X 2): Culture: NO GROWTH

## 2017-03-21 LAB — CULTURE, BLOOD (ROUTINE X 2)
Culture: NO GROWTH
Special Requests: ADEQUATE

## 2017-03-25 ENCOUNTER — Inpatient Hospital Stay: Payer: Medicare Other

## 2017-03-25 ENCOUNTER — Encounter: Payer: Self-pay | Admitting: Internal Medicine

## 2017-03-25 ENCOUNTER — Telehealth: Payer: Self-pay

## 2017-03-25 ENCOUNTER — Inpatient Hospital Stay: Payer: Medicare Other | Attending: Internal Medicine | Admitting: Internal Medicine

## 2017-03-25 VITALS — BP 122/60 | HR 80 | Temp 98.4°F | Resp 18 | Ht 65.0 in | Wt 172.7 lb

## 2017-03-25 DIAGNOSIS — E875 Hyperkalemia: Secondary | ICD-10-CM | POA: Diagnosis not present

## 2017-03-25 DIAGNOSIS — B450 Pulmonary cryptococcosis: Secondary | ICD-10-CM | POA: Insufficient documentation

## 2017-03-25 DIAGNOSIS — C9000 Multiple myeloma not having achieved remission: Secondary | ICD-10-CM

## 2017-03-25 DIAGNOSIS — N183 Chronic kidney disease, stage 3 (moderate): Secondary | ICD-10-CM | POA: Insufficient documentation

## 2017-03-25 DIAGNOSIS — G620 Drug-induced polyneuropathy: Secondary | ICD-10-CM | POA: Diagnosis not present

## 2017-03-25 DIAGNOSIS — C9002 Multiple myeloma in relapse: Secondary | ICD-10-CM | POA: Diagnosis not present

## 2017-03-25 DIAGNOSIS — R530 Neoplastic (malignant) related fatigue: Secondary | ICD-10-CM | POA: Diagnosis not present

## 2017-03-25 DIAGNOSIS — Z86000 Personal history of in-situ neoplasm of breast: Secondary | ICD-10-CM | POA: Diagnosis not present

## 2017-03-25 DIAGNOSIS — Z79899 Other long term (current) drug therapy: Secondary | ICD-10-CM | POA: Diagnosis not present

## 2017-03-25 LAB — CMP (CANCER CENTER ONLY)
ALBUMIN: 3.4 g/dL — AB (ref 3.5–5.0)
ALK PHOS: 108 U/L (ref 40–150)
ALT: 28 U/L (ref 0–55)
ANION GAP: 8 (ref 3–11)
AST: 19 U/L (ref 5–34)
BILIRUBIN TOTAL: 0.7 mg/dL (ref 0.2–1.2)
BUN: 31 mg/dL — AB (ref 7–26)
CALCIUM: 10.7 mg/dL — AB (ref 8.4–10.4)
CO2: 31 mmol/L — ABNORMAL HIGH (ref 22–29)
Chloride: 103 mmol/L (ref 98–109)
Creatinine: 2.53 mg/dL — ABNORMAL HIGH (ref 0.60–1.10)
GFR, Est AFR Am: 20 mL/min — ABNORMAL LOW (ref >60)
GFR, Estimated: 17 mL/min — ABNORMAL LOW (ref >60)
GLUCOSE: 105 mg/dL (ref 70–140)
Potassium: 5 mmol/L (ref 3.5–5.1)
Sodium: 142 mmol/L (ref 136–145)
TOTAL PROTEIN: 5.9 g/dL — AB (ref 6.4–8.3)

## 2017-03-25 LAB — CBC WITH DIFFERENTIAL (CANCER CENTER ONLY)
BASOS PCT: 1 %
Basophils Absolute: 0 10*3/uL (ref 0.0–0.1)
Eosinophils Absolute: 0 10*3/uL (ref 0.0–0.5)
Eosinophils Relative: 0 %
HEMATOCRIT: 33.7 % — AB (ref 34.8–46.6)
Hemoglobin: 11.2 g/dL — ABNORMAL LOW (ref 11.6–15.9)
Lymphocytes Relative: 15 %
Lymphs Abs: 0.9 10*3/uL (ref 0.9–3.3)
MCH: 36.8 pg — ABNORMAL HIGH (ref 25.1–34.0)
MCHC: 33.2 g/dL (ref 31.5–36.0)
MCV: 110.9 fL — AB (ref 79.5–101.0)
MONOS PCT: 10 %
Monocytes Absolute: 0.6 10*3/uL (ref 0.1–0.9)
NEUTROS ABS: 4.6 10*3/uL (ref 1.5–6.5)
NEUTROS PCT: 74 %
Platelet Count: 90 10*3/uL — ABNORMAL LOW (ref 145–400)
RBC: 3.04 MIL/uL — ABNORMAL LOW (ref 3.70–5.45)
RDW: 14.5 % (ref 11.2–14.5)
WBC Count: 6.2 10*3/uL (ref 3.9–10.3)

## 2017-03-25 LAB — LACTATE DEHYDROGENASE: LDH: 179 U/L (ref 125–245)

## 2017-03-25 NOTE — Progress Notes (Signed)
[place] [phone]   [phone]  OFFICE PROGRESS NOTE  [name], [place] [address]  DIAGNOSIS:  1. 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 Recurrent multiple myeloma initially diagnosed as a smoldering myeloma at [facility] in 2002. 3. Ductal carcinoma in situ status post mastectomy with sentinel lymph node biopsy in October 2008.  PRIOR THERAPY: 1. Status post treatment with tamoxifen from November 2008 through February 2009, discontinued secondary to intolerance. 2. Status post 3 cycles of chemotherapy with Revlimid and Decadron followed by 1 cycle of Decadron only with mild response. 3. Status post 2 cycles of systemic chemotherapy with Velcade, Doxil and Decadron discontinued secondary to significant peripheral neuropathy. Last dose was given May 2010 at [facility]. 4. Status post autologous peripheral blood stem cell transplant on October 01, 2008 at [facility] under the care of Dr. Phyllis Ginger.  5. Systemic chemotherapy with Carfilzomib initially was 20 mg/M2 and will be increased after cycle #1 to 36 mg/M2 on days 1, 2, 8, 9, 15 and 16 every 4 weeks in addition to Cytoxan 300 mg/M2 and Decadron 40 mg by mouth weekly basis, status post 4 cycles. First cycle on 12/29/2012. 6. She resumed chemotherapy with Carfilzomib, Cytoxan, and Decadron on 05/03/2014. Status post 6 cycles. 7. systemic chemotherapy again with Carfilzomib, Cytoxan and Decadron on 07/11/2015. Status post 3 cycles. 8. She completed a course of treatment with Diflucan 200 mg by mouth daily for around 6 weeks for cryptococcal infection of the lung.  CURRENT THERAPY: Resuming treatment again with Carfilzomib, Cytoxan and Decadron, first cycle 04/23/2016. Status post 7 cycles.  Treatment is currently on hold.  INTERVAL HISTORY: Nancy Norris 77 y.o. female returns to the clinic  today for follow-up visit.  The patient was recently hospitalized with flulike symptoms and pneumonia.  She is feeling much better today.  She was supposed to have repeat myeloma panel last week before this admission but this was missed because of her hospitalization.  She denied having any current chest pain, shortness breath, cough or hemoptysis.  She denied having any fever or chills.  She has no nausea, vomiting, diarrhea or constipation.  She continues to complain of fatigue.  MEDICAL HISTORY: Past Medical History:  Diagnosis Date  . Anemia   . Anginal pain ([place])    used NTG x 2 May 31 and 06/15/13   . Anxiety   . Arthritis   . B12 deficiency 12/04/2014  . Breast cancer ([place])   . CKD (chronic kidney disease) stage 3, GFR 30-59 ml/min (HCC)   . Complication of anesthesia   . COPD (chronic obstructive pulmonary disease) ([place])   . Cryptococcal pneumonitis ([place]) 11/22/2015  . Depression   . Dizziness   . Dyspnea   . Fibromyalgia   . Fibromyalgia   . GERD (gastroesophageal reflux disease)   . Headache(784.0)   . Heart murmur   . Hemoptysis 10/21/2015  . History of blood transfusion    last one May 12   . Hx of cardiovascular stress test    LexiScan with low level exercise Myoview (02/2013): No ischemia, EF 72%; normal study  . Hx of echocardiogram    a.  Echocardiogram (12/26/2012): EF 42-59%, grade 1 diastolic dysfunction;   b.  Echocardiogram (02/2013): EF 55-60%, no WMA, trivial effusion  . Hyperkalemia   . Hyponatremia   . Hypothyroidism   . Mucositis   .  Multiple myeloma   . Myocardial infarction [facility])    in past, patient was unaware.   . Neuropathy   . Pain in joint, pelvic region and thigh 07/07/2015  . PONV (postoperative nausea and vomiting) 2008   after mastestomy    ALLERGIES:  is allergic to codeine; latex; other; onion; zyprexa [olanzapine]; adhesive [tape]; hydrocodone; iodinated diagnostic agents; and sulfa antibiotics.  MEDICATIONS:  Current Outpatient  Medications  Medication Sig Dispense Refill  . acetaminophen (TYLENOL) 325 MG tablet Take 650 mg by mouth every 6 (six) hours as needed for mild pain or moderate pain.     Marland Kitchen albuterol (PROVENTIL HFA;VENTOLIN HFA) 108 (90 Base) MCG/ACT inhaler Inhale 1-2 puffs into the lungs every 6 (six) hours as needed for wheezing or shortness of breath (cough). 1 Inhaler 3  . benzonatate (TESSALON PERLES) 100 MG capsule Take 2 capsules (200 mg total) by mouth 3 (three) times daily as needed for cough (Max: '600mg'$  per day). 60 capsule 0  . butalbital-acetaminophen-caffeine (FIORICET, ESGIC) 50-325-40 MG tablet Take 1 tablet by mouth every 4 (four) hours as needed.  3  . cefpodoxime (VANTIN) 100 MG tablet Take 1 tablet (100 mg total) by mouth 2 (two) times daily. 8 tablet 0  . Cholecalciferol 4000 UNITS TABS Take 4,000 Units by mouth daily.    . citalopram (CELEXA) 40 MG tablet TAKE 1 TABLET BY MOUTH DAILY OR AS DIRECTED (Patient taking differently: Take 40 mg by mouth daily) 90 tablet 0  . cromolyn (OPTICROM) 4 % ophthalmic solution Place 1 drop into both eyes 4 (four) times daily as needed (irritation).     Marland Kitchen dexamethasone (DECADRON) 4 MG tablet Take 1 tablet (4 mg total) by mouth daily. Take 10 tablets by mouth once a week as directed. 30 tablet 1  . doxycycline (VIBRAMYCIN) 100 MG capsule Take 1 capsule 2 x/day with food for 10 days 20 capsule 0  . folic acid (FOLVITE) 1 MG tablet Take 1 tablet (1 mg total) by mouth daily. 30 tablet 3  . furosemide (LASIX) 40 MG tablet TAKE 1 TABLET BY MOUTH DAILY. (Patient taking differently: Take 40 mg by mouth as needed for fluid) 90 tablet 1  . gabapentin (NEURONTIN) 300 MG capsule TAKE 1 CAPSULE BY MOUTH AT BEDTIME. 90 capsule 3  . levothyroxine (SYNTHROID, LEVOTHROID) 150 MCG tablet Take 1 tablet daily - need office visit before further refills 30 tablet 0  . loperamide (IMODIUM) 2 MG capsule Take 1 capsule (2 mg total) by mouth as needed for diarrhea or loose stools. 30  capsule 2  . magic mouthwash SOLN Take 5 mLs by mouth 4 (four) times daily as needed for mouth pain. 240 mL 1  . montelukast (SINGULAIR) 10 MG tablet TAKE 1 TABLET BY MOUTH DAILY. 90 tablet 1  . nitroGLYCERIN (NITROSTAT) 0.4 MG SL tablet Place 0.4 mg under the tongue every 5 (five) minutes as needed for chest pain. Reported on 03/30/2015    . nystatin (MYCOSTATIN/NYSTOP) powder Apply topically 2 (two) times daily. (Patient taking differently: Apply 1 g topically 2 (two) times daily as needed (rash). ) 30 g 0  . oseltamivir (TAMIFLU) 30 MG capsule Take 1 capsule (30 mg total) by mouth daily. 4 capsule 0  . pilocarpine (SALAGEN) 5 MG tablet TAKE 1 TABLET BY MOUTH 3 TIMES DAILY FOR DRY MOUTH. 270 tablet 1  . predniSONE (DELTASONE) 20 MG tablet 2 tablets daily for 3 days, 1 tablet daily for 4 days. 10 tablet 0  . PRESCRIPTION  MEDICATION Pt gets chemo at [facility].    Marland Kitchen promethazine-dextromethorphan (PROMETHAZINE-DM) 6.25-15 MG/5ML syrup Take 5 mLs by mouth 4 (four) times daily as needed for cough. 240 mL 1  . ranitidine (ZANTAC) 300 MG tablet TAKE 1 TO 2 TABLETS DAILY FOR HEARTBURN & REFLUX 180 tablet 0  . senna (SENOKOT) 8.6 MG tablet Take 1 tablet by mouth at bedtime.    . traMADol (ULTRAM) 50 MG tablet Take 1 to 2 tablets by mouth 4 times a day as needed for pain. (Patient taking differently: Take 50-100 mg by mouth every 6 (six) hours as needed for moderate pain or severe pain. ) 180 tablet 0  . vitamin B-12 (CYANOCOBALAMIN) 1000 MCG tablet Take 1 tablet (1,000 mcg total) by mouth daily. 30 tablet 0  . vitamin C (ASCORBIC ACID) 250 MG tablet Take 250 mg by mouth daily.     Current Facility-Administered Medications  Medication Dose Route Frequency Provider Last Rate Last Dose  . ipratropium-albuterol (DUONEB) 0.5-2.5 (3) MG/3ML nebulizer solution 3 mL  3 mL Nebulization Once Liane Comber, NP        SURGICAL HISTORY:  Past Surgical History:  Procedure Laterality Date  . ABDOMINAL HYSTERECTOMY  1981   . AV FISTULA PLACEMENT Left 06/19/2013   Procedure: CREATION OF LEFT ARM ARTERIOVENOUS (AV) FISTULA ;  Surgeon: Angelia Mould, MD;  Location: [facility];  Service: Vascular;  Laterality: Left;  . BREAST RECONSTRUCTION    . BREAST SURGERY    . CATARACT EXTRACTION, BILATERAL    . CHOLECYSTECTOMY  1971  . EYE SURGERY Bilateral    lens implant  . history of Port removal    . LUNG BIOPSY  10/21/2015  . MASTECTOMY Left 2008  . PORTACATH PLACEMENT  12/2012   has had 2  . Status post stem cell transplant on September 28, 2008.      REVIEW OF SYSTEMS:  A comprehensive review of systems was negative except for: Constitutional: positive for fatigue   PHYSICAL EXAMINATION: General appearance: alert, cooperative, fatigued and no distress Head: Normocephalic, without obvious abnormality, atraumatic Neck: no adenopathy, no JVD, supple, symmetrical, trachea midline and thyroid not enlarged, symmetric, no tenderness/mass/nodules Lymph nodes: Cervical, supraclavicular, and axillary nodes normal. Resp: clear to auscultation bilaterally Back: symmetric, no curvature. ROM normal. No CVA tenderness. Cardio: regular rate and rhythm, S1, S2 normal, no murmur, click, rub or gallop GI: soft, non-tender; bowel sounds normal; no masses,  no organomegaly Extremities: extremities normal, atraumatic, no cyanosis or edema  ECOG PERFORMANCE STATUS: 1 - Symptomatic but completely ambulatory  Blood pressure 122/60, pulse 80, temperature 98.4 F (36.9 C), temperature source Oral, resp. rate 18, height '5\' 5"'$  (1.651 m), weight 172 lb 11.2 oz (78.3 kg), SpO2 98 %.  LABORATORY DATA: Lab Results  Component Value Date   WBC 3.8 (L) 03/18/2017   HGB 9.7 (L) 03/18/2017   HCT NOLAV 03/18/2017   MCV 113.4 (H) 03/18/2017   PLT 85 (L) 03/18/2017      Chemistry      Component Value Date/Time   NA 146 (H) 03/18/2017 0514   NA 144 01/17/2017 1146   K 4.4 03/18/2017 0514   K 3.7 01/17/2017 1146   CL 114 (H)  03/18/2017 0514   CL 105 03/19/2012 0811   CO2 21 (L) 03/18/2017 0514   CO2 25 01/17/2017 1146   BUN 27 (H) 03/18/2017 0514   BUN 35.0 (H) 01/17/2017 1146   CREATININE 2.20 (H) 03/18/2017 0514   CREATININE 3.39 (H)  03/13/2017 1212   CREATININE 2.8 (H) 01/17/2017 1146      Component Value Date/Time   CALCIUM 8.6 (L) 03/18/2017 0514   CALCIUM 9.1 01/17/2017 1146   ALKPHOS 110 01/17/2017 1146   AST 20 01/17/2017 1146   ALT 22 01/17/2017 1146   BILITOT 0.73 01/17/2017 1146       RADIOGRAPHIC STUDIES: Dg Chest 2 View  Result Date: 03/15/2017 CLINICAL DATA:  Patient states she was diagnosed with pneumonia two days ago and symptoms have increased even with medication. EXAM: CHEST  2 VIEW COMPARISON:  01/24/2017 and 09/19/2016 FINDINGS: Cardiac silhouette is normal in size. No mediastinal or hilar masses. No evidence of adenopathy. Relative increased opacity projects within the left mid lung, which appears to be due to asymmetry of the overlying breast tissue. There is no evidence of lung consolidation. Mild reticular opacities are noted at the lung bases consistent with scarring and/or subsegmental atelectasis. Lungs are otherwise clear. No pleural effusion or pneumothorax. Right internal jugular Port-A-Cath is stable. Skeletal structures are intact. Stable changes from previous left breast surgery. IMPRESSION: 1. No active cardiopulmonary disease. 2. No evidence of pneumonia. Electronically Signed   By: Lajean Manes M.D.   On: 03/15/2017 16:59   Ct Chest Wo Contrast  Result Date: 03/17/2017 CLINICAL DATA:  Pt with hx of breast cancer and recently diagnosed with MM follows with Dr. Julien Nordmann, presented with several days duration of progressively worsening fatigue, poor oral intake, dyspnea with exertion as well as at rest. Flu A positive EXAM: CT CHEST WITHOUT CONTRAST TECHNIQUE: Multidetector CT imaging of the chest was performed following the standard protocol without IV contrast. COMPARISON:   Chest radiograph, 03/15/2017.  Chest CT, 10/08/2016. FINDINGS: Cardiovascular: Heart is normal in size and configuration. There are moderate three-vessel coronary artery calcifications. Thoracic aorta is normal caliber. Mild atherosclerotic calcifications are noted along the arch and descending portion. Main pulmonary artery is mildly dilated to 3.6 cm. Mediastinum/Nodes: No neck base or axillary masses or enlarged lymph nodes. No mediastinal or hilar masses or adenopathy. Trachea is patent. Esophagus is unremarkable other than a small hiatal hernia. Lungs/Pleura: Patchy airspace consolidation is noted in the left lower lobe in a peribronchovascular distribution. There is dependent opacity in the right lower lobe. A smaller area of dependent opacity is noted in the left upper lobe lingula. There is a small focus irregular nodular opacity in the right upper lobe centered on image 49, series 5. Largest nodule measures 6 mm. Small nodule noted in the right middle lobe anteriorly, image 101, 3-4 mm. 3 mm nodule in the lateral right lower lobe, image 119. No evidence of pulmonary edema. No pneumothorax or pleural effusion. Mild centrilobular emphysema is noted in the upper lobes. Upper Abdomen: No acute findings. Bilateral renal cortical thinning. Musculoskeletal: No fracture or acute finding. No osteoblastic or osteolytic lesions. IMPRESSION: 1. Mild patchy areas of left lower lobe consolidation are noted consistent with pneumonia. Additional dependent opacity in the right lower lobe and left upper lobe lingula is more likely atelectasis. Small area of nodular opacities in the right upper lobe is also likely infectious in etiology. There are 2 small nodules, 1 in the right middle lobe and the other in the right lower lobe, that are stable from the prior chest CT. Compared to the prior chest CT, the area of nodular opacity right upper lobe, the patchy airspace consolidation in the left lower lobe and the right lower lobe  dependent opacity, felt most likely to be atelectasis,  are also new. 2. No other acute abnormalities. 3. Mild dilation of the main pulmonary artery. Consider a component of pulmonary hypertension. 4. Aortic atherosclerosis. Aortic Atherosclerosis (ICD10-I70.0) and Emphysema (ICD10-J43.9). Electronically Signed   By: Lajean Manes M.D.   On: 03/17/2017 12:56    ASSESSMENT AND PLAN:  This is a very pleasant 77 years old white female with relapsed multiple myeloma status post several chemotherapy regimens and she is currently on treatment with systemic chemotherapy with Carfilzomib, Cytoxan and Decadron status post 7 cycles. She has been tolerating this treatment well except for fatigue. The patient is currently on observation and she is doing fine.  She was recently treated for pneumonia. I recommended for her to have repeat myeloma panel performed today. If the myeloma panel showed no clear evidence for disease progression, I will see her back for follow-up visit in 3 months with repeat myeloma panel again. For the chronic renal insufficiency, she is followed by nephrology. For the peripheral neuropathy, she will continue her current treatment with Lyrica. The patient was advised to call immediately if she has any concerning symptoms in the interval. The patient voices understanding of current disease status and treatment options and is in agreement with the current care plan. All questions were answered. The patient knows to call the clinic with any problems, questions or concerns. We can certainly see the patient much sooner if necessary.  Disclaimer: This note was dictated with voice recognition software. Similar sounding words can inadvertently be transcribed and may not be corrected upon review.

## 2017-03-25 NOTE — Telephone Encounter (Signed)
Printed avs and calender of upcoming appointment. Per 3/11 los

## 2017-03-26 LAB — KAPPA/LAMBDA LIGHT CHAINS
KAPPA FREE LGHT CHN: 24.3 mg/L — AB (ref 3.3–19.4)
KAPPA, LAMDA LIGHT CHAIN RATIO: 3.98 — AB (ref 0.26–1.65)
LAMDA FREE LIGHT CHAINS: 6.1 mg/L (ref 5.7–26.3)

## 2017-03-26 LAB — BETA 2 MICROGLOBULIN, SERUM: Beta-2 Microglobulin: 4.8 mg/L — ABNORMAL HIGH (ref 0.6–2.4)

## 2017-03-26 LAB — IGG, IGA, IGM
IGA: 17 mg/dL — AB (ref 64–422)
IgG (Immunoglobin G), Serum: 341 mg/dL — ABNORMAL LOW (ref 700–1600)
IgM (Immunoglobulin M), Srm: 10 mg/dL — ABNORMAL LOW (ref 26–217)

## 2017-03-31 ENCOUNTER — Encounter: Payer: Self-pay | Admitting: Adult Health

## 2017-04-18 ENCOUNTER — Telehealth (INDEPENDENT_AMBULATORY_CARE_PROVIDER_SITE_OTHER): Payer: Self-pay | Admitting: Physical Medicine and Rehabilitation

## 2017-04-18 ENCOUNTER — Ambulatory Visit (INDEPENDENT_AMBULATORY_CARE_PROVIDER_SITE_OTHER): Payer: Medicare Other | Admitting: Internal Medicine

## 2017-04-18 ENCOUNTER — Encounter: Payer: Self-pay | Admitting: Internal Medicine

## 2017-04-18 DIAGNOSIS — B450 Pulmonary cryptococcosis: Secondary | ICD-10-CM | POA: Diagnosis not present

## 2017-04-18 DIAGNOSIS — J181 Lobar pneumonia, unspecified organism: Secondary | ICD-10-CM | POA: Diagnosis not present

## 2017-04-18 DIAGNOSIS — J189 Pneumonia, unspecified organism: Secondary | ICD-10-CM

## 2017-04-18 NOTE — Telephone Encounter (Signed)
If the last injection did not help, I am not sure we have much to offer can OV to discuss, if it helped greatly then ok to repeat, Lspine imaging not bad

## 2017-04-18 NOTE — Progress Notes (Signed)
   Subjective:    Patient ID: Nancy Norris, female    DOB: Nov 28, 1940, 77 y.o.   MRN: [id number]  HPI Here for follow up of cryptococcal pneumonia.  I saw her as a new patient on 12/12/15 and she was already on 200 mg fluconazole (renally dosed) for over 1 month.  She was getting chemotherapy for multiple myeloma and currently in a 3 month break until possibly January 2019.  She had a repeat CT scan of her chest and the area of concern has decreased from 3.8x3.0 to 2.1x1.7 and again prior to this visit was similar to her last scan in April 2018 at 1.4 x 2.1 cm. She was on fluconazole 200 mg (renally dosed) for about 1 year and stopped last visit 6 months ago.  She plans to continue on chemotherapy and restarting later this month.  She is having no associated cough, sob.  Recently diagnosed with pneumonia, influenza A, now feeling fine but was hospitalized.  CT scan done at the time and noted stable nodules.       Review of Systems  Constitutional: Negative for chills and fever.  Respiratory: Negative for cough and shortness of breath.   Gastrointestinal: Negative for diarrhea.  Skin: Negative for rash.       Objective:   Physical Exam  Constitutional: She appears well-developed and well-nourished.  Eyes: No scleral icterus.  Cardiovascular: Normal rate, regular rhythm and normal heart sounds.  No murmur heard. Pulmonary/Chest: Effort normal and breath sounds normal. No respiratory distress. She has no wheezes.  Skin: No rash noted.    SH: no tobacco      Assessment & Plan:

## 2017-04-18 NOTE — Assessment & Plan Note (Signed)
Resolved from recent bout.  Clear now.

## 2017-04-18 NOTE — Assessment & Plan Note (Signed)
Stable off of fluconazole.  Will continue off and with a stable nodule, I do not recommend further CT scans unless there are new clinical concerns. She can return PRN

## 2017-04-19 NOTE — Telephone Encounter (Signed)
Patient reports that she had no pain for about 2 weeks after last injection. Scheduled for repeat 04/16/17 at 1030 with driver and no blood thinners.

## 2017-04-24 DIAGNOSIS — N2581 Secondary hyperparathyroidism of renal origin: Secondary | ICD-10-CM | POA: Diagnosis not present

## 2017-04-24 DIAGNOSIS — I951 Orthostatic hypotension: Secondary | ICD-10-CM | POA: Diagnosis not present

## 2017-04-24 DIAGNOSIS — N184 Chronic kidney disease, stage 4 (severe): Secondary | ICD-10-CM | POA: Diagnosis not present

## 2017-04-24 DIAGNOSIS — I770 Arteriovenous fistula, acquired: Secondary | ICD-10-CM | POA: Diagnosis not present

## 2017-04-24 DIAGNOSIS — D630 Anemia in neoplastic disease: Secondary | ICD-10-CM | POA: Diagnosis not present

## 2017-04-24 DIAGNOSIS — I5030 Unspecified diastolic (congestive) heart failure: Secondary | ICD-10-CM | POA: Diagnosis not present

## 2017-04-24 DIAGNOSIS — R809 Proteinuria, unspecified: Secondary | ICD-10-CM | POA: Diagnosis not present

## 2017-04-24 DIAGNOSIS — C9000 Multiple myeloma not having achieved remission: Secondary | ICD-10-CM | POA: Diagnosis not present

## 2017-04-24 DIAGNOSIS — D631 Anemia in chronic kidney disease: Secondary | ICD-10-CM | POA: Diagnosis not present

## 2017-04-24 DIAGNOSIS — R079 Chest pain, unspecified: Secondary | ICD-10-CM | POA: Diagnosis not present

## 2017-04-24 DIAGNOSIS — N390 Urinary tract infection, site not specified: Secondary | ICD-10-CM | POA: Diagnosis not present

## 2017-04-24 DIAGNOSIS — R911 Solitary pulmonary nodule: Secondary | ICD-10-CM | POA: Diagnosis not present

## 2017-04-30 DIAGNOSIS — Z1231 Encounter for screening mammogram for malignant neoplasm of breast: Secondary | ICD-10-CM | POA: Diagnosis not present

## 2017-04-30 DIAGNOSIS — Z853 Personal history of malignant neoplasm of breast: Secondary | ICD-10-CM | POA: Diagnosis not present

## 2017-04-30 LAB — HM MAMMOGRAPHY

## 2017-05-06 ENCOUNTER — Ambulatory Visit (INDEPENDENT_AMBULATORY_CARE_PROVIDER_SITE_OTHER): Payer: Medicare Other

## 2017-05-06 ENCOUNTER — Encounter (INDEPENDENT_AMBULATORY_CARE_PROVIDER_SITE_OTHER): Payer: Self-pay | Admitting: Physical Medicine and Rehabilitation

## 2017-05-06 ENCOUNTER — Ambulatory Visit (INDEPENDENT_AMBULATORY_CARE_PROVIDER_SITE_OTHER): Payer: Medicare Other | Admitting: Physical Medicine and Rehabilitation

## 2017-05-06 ENCOUNTER — Other Ambulatory Visit: Payer: Self-pay | Admitting: Internal Medicine

## 2017-05-06 VITALS — BP 130/63 | HR 70 | Temp 98.2°F

## 2017-05-06 DIAGNOSIS — G894 Chronic pain syndrome: Secondary | ICD-10-CM

## 2017-05-06 DIAGNOSIS — M7918 Myalgia, other site: Secondary | ICD-10-CM

## 2017-05-06 DIAGNOSIS — M797 Fibromyalgia: Secondary | ICD-10-CM | POA: Diagnosis not present

## 2017-05-06 DIAGNOSIS — M5416 Radiculopathy, lumbar region: Secondary | ICD-10-CM

## 2017-05-06 DIAGNOSIS — M47816 Spondylosis without myelopathy or radiculopathy, lumbar region: Secondary | ICD-10-CM

## 2017-05-06 DIAGNOSIS — M4316 Spondylolisthesis, lumbar region: Secondary | ICD-10-CM

## 2017-05-06 MED ORDER — BETAMETHASONE SOD PHOS & ACET 6 (3-3) MG/ML IJ SUSP
12.0000 mg | Freq: Once | INTRAMUSCULAR | Status: AC
  Administered 2017-05-06: 12 mg

## 2017-05-06 NOTE — Patient Instructions (Signed)

## 2017-05-06 NOTE — Progress Notes (Signed)
.     Numeric Pain Rating Scale and Functional Assessment Average Pain 10   In the last MONTH (on 0-10 scale) has pain interfered with the following?  1. General activity like being  able to carry out your everyday physical activities such as walking, climbing stairs, carrying groceries, or moving a chair?  Rating(7)   +Driver, -BT, -Dye Allergies.

## 2017-05-07 NOTE — Procedures (Signed)
Lumbosacral Transforaminal Epidural Steroid Injection - Sub-Pedicular Approach with Fluoroscopic Guidance  Patient: Nancy Norris      Date of Birth: 1940-05-11 MRN: [id number] PCP: [name], MD      Visit Date: 05/06/2017   Universal Protocol:    Date/Time: 05/06/2017  Consent Given By: the patient  Position: PRONE  Additional Comments: Vital signs were monitored before and after the procedure. Patient was prepped and draped in the usual sterile fashion. The correct patient, procedure, and site was verified.   Injection Procedure Details:  Procedure Site One Meds Administered:  Meds ordered this encounter  Medications  . betamethasone acetate-betamethasone sodium phosphate (CELESTONE) injection 12 mg    Laterality: Right  Location/Site:  L5-S1  Needle size: 22 G  Needle type: Spinal  Needle Placement: Transforaminal  Findings:    -Comments: Excellent flow of contrast along the nerve and into the epidural space.  Procedure Details: After squaring off the end-plates to get a true AP view, the C-arm was positioned so that an oblique view of the foramen as noted above was visualized. The target area is just inferior to the "nose of the scotty dog" or sub pedicular. The soft tissues overlying this structure were infiltrated with 2-3 ml. of 1% Lidocaine without Epinephrine.  The spinal needle was inserted toward the target using a "trajectory" view along the fluoroscope beam.  Under AP and lateral visualization, the needle was advanced so it did not puncture dura and was located close the 6 O'Clock position of the pedical in AP tracterory. Biplanar projections were used to confirm position. Aspiration was confirmed to be negative for CSF and/or blood. A 1-2 ml. volume of Isovue-250 was injected and flow of contrast was noted at each level. Radiographs were obtained for documentation purposes.   After attaining the desired flow of contrast documented above, a 0.5 to  1.0 ml test dose of 0.25% Marcaine was injected into each respective transforaminal space.  The patient was observed for 90 seconds post injection.  After no sensory deficits were reported, and normal lower extremity motor function was noted,   the above injectate was administered so that equal amounts of the injectate were placed at each foramen (level) into the transforaminal epidural space.   Additional Comments:  The patient tolerated the procedure well Dressing: Band-Aid    Post-procedure details: Patient was observed during the procedure. Post-procedure instructions were reviewed.  Patient left the clinic in stable condition.

## 2017-05-07 NOTE — Progress Notes (Signed)
Nancy Norris - 77 y.o. female MRN [id number]  Date of birth: 1940-11-10  Office Visit Note: Visit Date: 05/06/2017 PCP: [name], MD Referred by: [name], MD  Subjective: Chief Complaint  Patient presents with  . Lower Back - Pain  . Right Leg - Pain  . Right Foot - Pain   HPI: Nancy Norris is a very pleasant 77 year old female but somewhat medically complicated with history of multiple myeloma as well as depression anxiety and fibromyalgia.  We have seen her over the years and in the past epidural injections seem to give her quite a bit of relief for her right radicular leg pain.  Most recently we saw her and completed a right L3 transforaminal epidural steroid injection  which according to her did seem to provide may be about a month to 6 weeks of some relief.  Her comments are somewhat vague on the amount of relief.  She gets pain that originates really at the lumbar area and radiates or refers through the buttock and greater trochanteric area and down the leg into the foot and somewhat of a classic L5 distribution.  We had updated her MRI of her lumbar spine last year and this is reviewed below.  We went over the MRI findings today and even though we have gone over these before she was somewhat surprised to know that the disc herniation which was present in 2011 at L5-S1 has essentially completely resolved at this point.  She does have some degenerative changes there with disc height loss and some foraminal narrowing.  Prior disc herniation that was there was very small.  A lot of the pain complaints she has I think underlie her have is an underlying basis the fibromyalgia.  Nonetheless she is having significant right hip and leg pain.  She denies left-sided symptoms.  She denies any focal weakness.  She does endorse some feeling of fatigue and malaise.  Her symptoms worsen with standing and walking activity.  They decreased with rest and some medication.  She rates her average  pain is a 10 out of 10.  She reports being functionally limited by the pain.  She has had no new trauma.  No recent weight loss.  No fever chills or night sweats.   Review of Systems  Constitutional: Positive for malaise/fatigue. Negative for chills, fever and weight loss.  HENT: Negative for hearing loss and sinus pain.   Eyes: Negative for blurred vision, double vision and photophobia.  Respiratory: Negative for cough and shortness of breath.   Cardiovascular: Negative for chest pain, palpitations and leg swelling.  Gastrointestinal: Negative for abdominal pain, nausea and vomiting.  Genitourinary: Negative for flank pain.  Musculoskeletal: Positive for back pain and joint pain. Negative for myalgias.  Skin: Negative for itching and rash.  Neurological: Positive for tingling. Negative for tremors, focal weakness and weakness.  Endo/Heme/Allergies: Negative.   Psychiatric/Behavioral: Negative for depression.  All other systems reviewed and are negative.  Otherwise per HPI.  Assessment & Plan: Visit Diagnoses:  1. Lumbar radiculopathy   2. Spondylosis without myelopathy or radiculopathy, lumbar region   3. Spondylolisthesis of lumbar region   4. Fibromyalgia   5. Myofascial pain syndrome   6. Chronic pain syndrome     Plan: Findings:  Chronic pain syndrome and chronic worsening right radicular leg pain in the setting of MRI findings of chronic right lateral L3 disc herniation as well as facet arthropathy of the lower spine but no central canal stenosis.  She has small listhesis.  She does have some disc height loss at L5-S1 with some bi-foraminal narrowing.  She has underlying fibromyalgia.  Right L3 injection seemed to help some but a very vague issue if it helped.  Her complaints at that time seemed to be twofold she had more thigh pain as well as the leg pain going down.  She may in fact have 2 issues in the fibromyalgia makes her just sensitive to feeling the pain in these areas.  I  think the best approach is diagnostic and hopefully therapeutic right L5 transforaminal injection do the foraminal narrowing.  I also want to write a prescription for her to regroup with a physical therapist to look at this from a more mechanical standpoint with possibly dry needling and looking at myofascial pain.  First see over the next week or 2 how she does with the injection and then she can start with physical therapy.  She has gone to [facility] in the past.    Meds & Orders:  Meds ordered this encounter  Medications  . betamethasone acetate-betamethasone sodium phosphate (CELESTONE) injection 12 mg    Orders Placed This Encounter  Procedures  . XR C-ARM NO REPORT  . Ambulatory referral to Physical Therapy  . Epidural Steroid injection    Follow-up: Return if symptoms worsen or fail to improve.   Procedures: No procedures performed  Lumbosacral Transforaminal Epidural Steroid Injection - Sub-Pedicular Approach with Fluoroscopic Guidance  Patient: Nancy Norris      Date of Birth: [birth year] MRN: [id number] PCP: [name], MD      Visit Date: 05/06/2017   Universal Protocol:    Date/Time: 05/06/2017  Consent Given By: the patient  Position: PRONE  Additional Comments: Vital signs were monitored before and after the procedure. Patient was prepped and draped in the usual sterile fashion. The correct patient, procedure, and site was verified.   Injection Procedure Details:  Procedure Site One Meds Administered:  Meds ordered this encounter  Medications  . betamethasone acetate-betamethasone sodium phosphate (CELESTONE) injection 12 mg    Laterality: Right  Location/Site:  L5-S1  Needle size: 22 G  Needle type: Spinal  Needle Placement: Transforaminal  Findings:    -Comments: Excellent flow of contrast along the nerve and into the epidural space.  Procedure Details: After squaring off the end-plates to get a true AP view, the C-arm  was positioned so that an oblique view of the foramen as noted above was visualized. The target area is just inferior to the "nose of the scotty dog" or sub pedicular. The soft tissues overlying this structure were infiltrated with 2-3 ml. of 1% Lidocaine without Epinephrine.  The spinal needle was inserted toward the target using a "trajectory" view along the fluoroscope beam.  Under AP and lateral visualization, the needle was advanced so it did not puncture dura and was located close the 6 O'Clock position of the pedical in AP tracterory. Biplanar projections were used to confirm position. Aspiration was confirmed to be negative for CSF and/or blood. A 1-2 ml. volume of Isovue-250 was injected and flow of contrast was noted at each level. Radiographs were obtained for documentation purposes.   After attaining the desired flow of contrast documented above, a 0.5 to 1.0 ml test dose of 0.25% Marcaine was injected into each respective transforaminal space.  The patient was observed for 90 seconds post injection.  After no sensory deficits were reported, and normal lower extremity motor function was noted,  the above injectate was administered so that equal amounts of the injectate were placed at each foramen (level) into the transforaminal epidural space.   Additional Comments:  The patient tolerated the procedure well Dressing: Band-Aid    Post-procedure details: Patient was observed during the procedure. Post-procedure instructions were reviewed.  Patient left the clinic in stable condition.    Clinical History: MRI LUMBAR SPINE WITHOUT CONTRAST  TECHNIQUE: Multiplanar, multisequence MR imaging of the lumbar spine was performed. No intravenous contrast was administered.  COMPARISON:  CT abdomen 11/23/2013  FINDINGS: Segmentation:  5 lumbar type vertebral bodies.  Alignment: Mild curvature convex to the right. 2 mm anterolisthesis L4-5.  Vertebrae: No fracture or primary bone  lesion. No sign of metastatic cancer.  Conus medullaris and cauda equina: Conus extends to the L1 level. Conus and cauda equina appear normal.  Paraspinal and other soft tissues: Renal atrophy. Otherwise negative.  Disc levels:  No significant finding at L2-3 or above.  No stenosis.  L3-4: Bulging of the disc, focally prominent in the right extraforaminal region. Mild facet hypertrophy. Mild narrowing of both lateral recesses but no neural compression in the spinal canal. There is some potential that the extraforaminal disc protrusion could irritate the right L3 nerve.  L4-5: Bilateral facet osteoarthritis with 2 mm anterolisthesis. Mild bulging of the disc. No compressive stenosis.  L5-S1: Chronic disc degeneration with loss of disc height. Endplate osteophytes and bulging of the disc. Very large spinal canal this level. No stenosis or neural compression.  IMPRESSION: L3-4: Small right extraforaminal disc protrusion could focally irritate the right L3 nerve.  L4-5: Chronic appearing facet osteoarthritis with 2 mm of anterolisthesis. Mild bulging of the disc. No compressive stenosis.  L5-S1: Chronic disc degeneration and minor disc bulge. No stenosis or neural compression.   Electronically Signed   By: Nelson Chimes M.D.   On: 01/01/2017 08:52   She reports that she quit smoking about 12 years ago. Her smoking use included cigarettes. She has a 30.00 pack-year smoking history. She has never used smokeless tobacco.  Recent Labs    05/30/16 1152  HGBA1C 5.0    Objective:  VS:  HT:    WT:   BMI:     BP:130/63  HR:70bpm  TEMP:98.2 F (36.8 C)(Oral)  RESP:100 % Physical Exam  Constitutional: She is oriented to person, place, and time. She appears well-developed and well-nourished. No distress.  HENT:  Head: Normocephalic and atraumatic.  Nose: Nose normal.  Mouth/Throat: Oropharynx is clear and moist.  Eyes: Pupils are equal, round, and reactive to  light. Conjunctivae are normal.  Neck: Normal range of motion. Neck supple.  Cardiovascular: Regular rhythm and intact distal pulses.  Pulmonary/Chest: Effort normal. No respiratory distress.  Abdominal: She exhibits no distension. There is no guarding.  Musculoskeletal:  Patient ambulates without aid she is somewhat slow to rise from a seated position.  She does stand with somewhat of a forward flexed lumbar spine.  There is no obvious scoliosis.  She does have concordant low back pain with extension rotation of the lumbar spine with facet loading.  She has not tenderness across the lower back and PSIS and greater trochanters but no exquisite pain on palpation.  She has no pain with hip rotation and good distal strength.  Neurological: She is alert and oriented to person, place, and time. She exhibits normal muscle tone. Coordination normal.  Skin: Skin is warm. No rash noted. No erythema.  Psychiatric: She has a normal mood and  affect. Her behavior is normal.  Nursing note and vitals reviewed.   Ortho Exam Imaging: Xr C-arm No Report  Result Date: 05/06/2017 Please see Notes or Procedures tab for imaging impression.   Past Medical/Family/Surgical/Social History: Medications & Allergies reviewed per EMR, new medications updated. Patient Active Problem List   Diagnosis Date Noted  . Influenza 03/16/2017  . Nausea with vomiting 09/19/2016  . Port catheter in place 09/18/2016  . Vaginal yeast infection 01/20/2016  . Cryptococcal pneumonitis ([place]) 11/22/2015  . Lung mass 09/28/2015  . Nodule of left lung 09/13/2015  . Pneumonia 08/23/2015  . Fever 07/13/2015  . Multiple myeloma not having achieved remission ([place]) March 23, 202017  . Anemia in neoplastic disease 03/31/2015  . B12 deficiency 12/04/2014  . Thrombocytopenia ([place]) 12/04/2014  . Encounter for antineoplastic chemotherapy 06/21/2014  . CKD (chronic kidney disease) stage 5, GFR less than 15 ml/min (HCC) 04/02/2014  . Multiple  myeloma ([place]) 12/02/2013  . Depression, controlled 11/02/2013  . Medication management 09/30/2013  . Vitamin D deficiency 03/24/2013  . Mixed hyperlipidemia 02/24/2013  . Prediabetes 02/24/2013  . COPD (chronic obstructive pulmonary disease) with emphysema ([place]) 02/20/2013  . GERD (gastroesophageal reflux disease) 01/20/2013  . HTN (hypertension) 01/14/2013  . Asthma, chronic 01/13/2013  . Hypothyroid 01/13/2013  . Chronic diastolic heart failure ([place]) 01/13/2013  . HX: breast cancer 12/08/2012  . Hypercalcemia 12/02/2012   Past Medical History:  Diagnosis Date  . Anemia   . Anginal pain ([place])    used NTG x 2 May 31 and 06/15/13   . Anxiety   . Arthritis   . B12 deficiency 12/04/2014  . Breast cancer ([place])   . CKD (chronic kidney disease) stage 3, GFR 30-59 ml/min (HCC)   . Complication of anesthesia   . COPD (chronic obstructive pulmonary disease) ([place])   . Cryptococcal pneumonitis ([place]) 11/22/2015  . Depression   . Dizziness   . Dyspnea   . Fibromyalgia   . Fibromyalgia   . GERD (gastroesophageal reflux disease)   . Headache(784.0)   . Heart murmur   . Hemoptysis 10/21/2015  . History of blood transfusion    last one May 12   . Hx of cardiovascular stress test    LexiScan with low level exercise Myoview (02/2013): No ischemia, EF 72%; normal study  . Hx of echocardiogram    a.  Echocardiogram (12/26/2012): EF 19-37%, grade 1 diastolic dysfunction;   b.  Echocardiogram (02/2013): EF 55-60%, no WMA, trivial effusion  . Hyperkalemia   . Hyponatremia   . Hypothyroidism   . Mucositis   . Multiple myeloma   . Myocardial infarction [facility])    in past, patient was unaware.   . Neuropathy   . Pain in joint, pelvic region and thigh 07/07/2015  . PONV (postoperative nausea and vomiting) 2008   after mastestomy   Family History  Problem Relation Age of Onset  . Arthritis Mother   . Asthma Mother   . Cancer Sister   . Hyperlipidemia Brother    Past Surgical History:    Procedure Laterality Date  . ABDOMINAL HYSTERECTOMY  1981  . AV FISTULA PLACEMENT Left 06/19/2013   Procedure: CREATION OF LEFT ARM ARTERIOVENOUS (AV) FISTULA ;  Surgeon: Angelia Mould, MD;  Location: [facility];  Service: Vascular;  Laterality: Left;  . BREAST RECONSTRUCTION    . BREAST SURGERY    . CATARACT EXTRACTION, BILATERAL    . CHOLECYSTECTOMY  1971  . EYE SURGERY Bilateral    lens  implant  . history of Port removal    . LUNG BIOPSY  10/21/2015  . MASTECTOMY Left 2008  . PORTACATH PLACEMENT  12/2012   has had 2  . Status post stem cell transplant on September 28, 2008.     Social History   Occupational History  . Occupation: retired-sect.(Clinical cytogeneticist)  Tobacco Use  . Smoking status: Former Smoker    Packs/day: 1.00    Years: 30.00    Pack years: 30.00    Types: Cigarettes    Last attempt to quit: 02/15/2005    Years since quitting: 12.2  . Smokeless tobacco: Never Used  Substance and Sexual Activity  . Alcohol use: No  . Drug use: No  . Sexual activity: Never

## 2017-05-16 ENCOUNTER — Ambulatory Visit (INDEPENDENT_AMBULATORY_CARE_PROVIDER_SITE_OTHER): Payer: Medicare Other | Admitting: Vascular Surgery

## 2017-05-16 ENCOUNTER — Encounter: Payer: Self-pay | Admitting: *Deleted

## 2017-05-16 ENCOUNTER — Encounter: Payer: Self-pay | Admitting: Vascular Surgery

## 2017-05-16 ENCOUNTER — Encounter (INDEPENDENT_AMBULATORY_CARE_PROVIDER_SITE_OTHER): Payer: Self-pay

## 2017-05-16 ENCOUNTER — Other Ambulatory Visit: Payer: Self-pay

## 2017-05-16 ENCOUNTER — Other Ambulatory Visit: Payer: Self-pay | Admitting: *Deleted

## 2017-05-16 ENCOUNTER — Telehealth: Payer: Self-pay | Admitting: *Deleted

## 2017-05-16 VITALS — BP 118/71 | HR 83 | Resp 18 | Ht 65.0 in | Wt 174.8 lb

## 2017-05-16 DIAGNOSIS — N184 Chronic kidney disease, stage 4 (severe): Secondary | ICD-10-CM

## 2017-05-16 NOTE — Progress Notes (Signed)
Patient name: Nancy Norris MRN: [id number] DOB: 07/12/1940 Sex: female  REASON FOR VISIT:   To evaluate an aneurysm of her AV fistula.  The consult is requested by Dr. Marval Regal.  HPI:   Nancy Norris is a pleasant 77 y.o. female who had a left brachiocephalic fistula placed on 06/19/2013.  I saw him on follow-up on 07/29/2013.  At that time the fistula was maturing nicely.  I plan on seeing her back as needed.   The patient was seen by nephrology on 04/24/2017.  She has stage IV chronic kidney disease.  I have reviewed those records.  Her kidney issues are secondary to multiple myeloma with nephrotoxic agents.  On exam she was noted to have an enlarging aneurysm near the arterial side of her left upper arm fistula.  Labs at that time showed a creatinine of 2.66 with a GFR of 17.  Her fistula has become aneurysmal above the antecubital level.  She describes some mild pain with this but for the most part just does not like the way it looks.  Given that is continued to enlarge she would like to address this.  Past Medical History:  Diagnosis Date  . Anemia   . Anginal pain ([place])    used NTG x 2 May 31 and 06/15/13   . Anxiety   . Arthritis   . B12 deficiency 12/04/2014  . Breast cancer ([place])   . CKD (chronic kidney disease) stage 3, GFR 30-59 ml/min (HCC)   . Complication of anesthesia   . COPD (chronic obstructive pulmonary disease) ([place])   . Cryptococcal pneumonitis ([place]) 11/22/2015  . Depression   . Dizziness   . Dyspnea   . Fibromyalgia   . Fibromyalgia   . GERD (gastroesophageal reflux disease)   . Headache(784.0)   . Heart murmur   . Hemoptysis 10/21/2015  . History of blood transfusion    last one May 12   . Hx of cardiovascular stress test    LexiScan with low level exercise Myoview (02/2013): No ischemia, EF 72%; normal study  . Hx of echocardiogram    a.  Echocardiogram (12/26/2012): EF 85-02%, grade 1 diastolic dysfunction;   b.  Echocardiogram (02/2013): EF 55-60%, no  WMA, trivial effusion  . Hyperkalemia   . Hyponatremia   . Hypothyroidism   . Mucositis   . Multiple myeloma   . Myocardial infarction [facility])    in past, patient was unaware.   . Neuropathy   . Pain in joint, pelvic region and thigh 07/07/2015  . PONV (postoperative nausea and vomiting) 2008   after mastestomy    Family History  Problem Relation Age of Onset  . Arthritis Mother   . Asthma Mother   . Cancer Sister   . Hyperlipidemia Brother     SOCIAL HISTORY: Social History   Tobacco Use  . Smoking status: Former Smoker    Packs/day: 1.00    Years: 30.00    Pack years: 30.00    Types: Cigarettes    Last attempt to quit: 02/15/2005    Years since quitting: 12.2  . Smokeless tobacco: Never Used  Substance Use Topics  . Alcohol use: No    Allergies  Allergen Reactions  . Codeine Anaphylaxis  . Latex Shortness Of Breath    Adhesive products   . Other Other (See Comments)    Onion, chocolate causes migraines  . Onion Other (See Comments)    Causes migraine headaches  . Zyprexa [Olanzapine] Other (  See Comments)    Confusion , dizzy,unsteady  . Adhesive [Tape] Other (See Comments)    blisters  . Heparin Rash  . Hydrocodone Rash    With extreme itching  . Iodinated Diagnostic Agents Hives, Itching and Rash    Happened 60 years ago Stage IV kidney function  . Promethazine-Dm Hives  . Sulfa Antibiotics Itching    Current Outpatient Medications  Medication Sig Dispense Refill  . acetaminophen (TYLENOL) 325 MG tablet Take 650 mg by mouth every 6 (six) hours as needed for mild pain or moderate pain.     Marland Kitchen albuterol (PROVENTIL HFA;VENTOLIN HFA) 108 (90 Base) MCG/ACT inhaler Inhale 1-2 puffs into the lungs every 6 (six) hours as needed for wheezing or shortness of breath (cough). 1 Inhaler 3  . benzonatate (TESSALON PERLES) 100 MG capsule Take 2 capsules (200 mg total) by mouth 3 (three) times daily as needed for cough (Max: 61m per day). 60 capsule 0  .  butalbital-acetaminophen-caffeine (FIORICET, ESGIC) 50-325-40 MG tablet Take 1 tablet by mouth every 4 (four) hours as needed.  3  . Cholecalciferol 4000 UNITS TABS Take 4,000 Units by mouth daily.    . citalopram (CELEXA) 40 MG tablet TAKE 1 TABLET BY MOUTH DAILY OR AS DIRECTED (Patient taking differently: Take 40 mg by mouth daily) 90 tablet 0  . cromolyn (OPTICROM) 4 % ophthalmic solution Place 1 drop into both eyes 4 (four) times daily as needed (irritation).     .Marland Kitchendexamethasone (DECADRON) 4 MG tablet Take 1 tablet (4 mg total) by mouth daily. Take 10 tablets by mouth once a week as directed. 30 tablet 1  . folic acid (FOLVITE) 1 MG tablet Take 1 tablet (1 mg total) by mouth daily. 30 tablet 3  . furosemide (LASIX) 40 MG tablet TAKE 1 TABLET BY MOUTH DAILY. (Patient taking differently: Take 40 mg by mouth as needed for fluid) 90 tablet 1  . gabapentin (NEURONTIN) 300 MG capsule TAKE 1 CAPSULE BY MOUTH AT BEDTIME. 90 capsule 3  . levothyroxine (SYNTHROID, LEVOTHROID) 150 MCG tablet TAKE 1 TABLET BY MOUTH DAILY. NEED OFFICE VISIT FOR FURTHER REFILLS. 90 tablet 0  . loperamide (IMODIUM) 2 MG capsule Take 1 capsule (2 mg total) by mouth as needed for diarrhea or loose stools. 30 capsule 2  . magic mouthwash SOLN Take 5 mLs by mouth 4 (four) times daily as needed for mouth pain. 240 mL 1  . montelukast (SINGULAIR) 10 MG tablet TAKE 1 TABLET BY MOUTH DAILY. 90 tablet 1  . nitroGLYCERIN (NITROSTAT) 0.4 MG SL tablet Place 0.4 mg under the tongue every 5 (five) minutes as needed for chest pain. Reported on 03/30/2015    . nystatin (MYCOSTATIN/NYSTOP) powder Apply topically 2 (two) times daily. (Patient taking differently: Apply 1 g topically 2 (two) times daily as needed (rash). ) 30 g 0  . pilocarpine (SALAGEN) 5 MG tablet TAKE 1 TABLET BY MOUTH 3 TIMES DAILY FOR DRY MOUTH. 270 tablet 1  . predniSONE (DELTASONE) 20 MG tablet 2 tablets daily for 3 days, 1 tablet daily for 4 days. 10 tablet 0  .  [facility]    . ranitidine (ZANTAC) 300 MG tablet TAKE 1 TO 2 TABLETS DAILY FOR HEARTBURN & REFLUX 180 tablet 0  . senna (SENOKOT) 8.6 MG tablet Take 1 tablet by mouth at bedtime.    . traMADol (ULTRAM) 50 MG tablet Take 1 to 2 tablets by mouth 4 times a day as needed  for pain. (Patient taking differently: Take 50-100 mg by mouth every 6 (six) hours as needed for moderate pain or severe pain. ) 180 tablet 0  . vitamin B-12 (CYANOCOBALAMIN) 1000 MCG tablet Take 1 tablet (1,000 mcg total) by mouth daily. 30 tablet 0  . vitamin C (ASCORBIC ACID) 250 MG tablet Take 250 mg by mouth daily.     Current Facility-Administered Medications  Medication Dose Route Frequency Provider Last Rate Last Dose  . ipratropium-albuterol (DUONEB) 0.5-2.5 (3) MG/3ML nebulizer solution 3 mL  3 mL Nebulization Once Liane Comber, NP        REVIEW OF SYSTEMS:  _0  denotes positive finding, _1  denotes negative finding Cardiac  Comments:  Chest pain or chest pressure:    Shortness of breath upon exertion: x   Short of breath when lying flat: x   Irregular heart rhythm:        Vascular    Pain in calf, thigh, or hip brought on by ambulation:    Pain in feet at night that wakes you up from your sleep:     Blood clot in your veins:    Leg swelling:         Pulmonary    Oxygen at home:    Productive cough:     Wheezing:         Neurologic    Sudden weakness in arms or legs:     Sudden numbness in arms or legs:     Sudden onset of difficulty speaking or slurred speech:    Temporary loss of vision in one eye:     Problems with dizziness:         Gastrointestinal    Blood in stool:     Vomited blood:         Genitourinary    Burning when urinating:     Blood in urine:        Psychiatric    Major depression:  x       Hematologic    Bleeding problems:    Problems with blood clotting too easily:        Skin    Rashes or ulcers:        Constitutional    Fever or  chills:     PHYSICAL EXAM:   Vitals:   05/16/17 1243  BP: 118/71  Pulse: 83  Resp: 18  SpO2: 98%  Weight: 174 lb 12.8 oz (79.3 kg)  Height: _2  (1.651 m)    GENERAL: The patient is a well-nourished female, in no acute distress. The vital signs are documented above. CARDIAC: There is a regular rate and rhythm.  VASCULAR: She has a good thrill in her left upper arm fistula.  It is markedly aneurysmal just above the antecubital level. PULMONARY: There is good air exchange bilaterally without wheezing or rales. MUSCULOSKELETAL: There are no major deformities or cyanosis. NEUROLOGIC: No focal weakness or paresthesias are detected. SKIN: There are no ulcers or rashes noted. PSYCHIATRIC: The patient has a normal affect.  DATA:    No new data  MEDICAL ISSUES:    ANEURYSMAL LEFT UPPER ARM FISTULA: This fistula has been present for 5 years.  She is not on dialysis.  The proximal segment is markedly aneurysmal.  Given the continued enlargement I have recommended plication of the proximal fistula and she is agreeable with this.  We have discussed the procedure and potential complications and she is agreeable to proceed.  Her surgery scheduled on  06/04/2017.  We also discussed the options of simply following this.  In addition, she had expressed some concerns that she would not want to do dialysis in the future.  However, I strongly recommended against ligating the fistula.   Deitra Mayo [facility] [phone]

## 2017-05-16 NOTE — Telephone Encounter (Signed)
Call to patient and left message of time change to arrive at hospital at 8 am for surgery on 06/04/17. All other instructions per office letter.

## 2017-05-16 NOTE — H&P (View-Only) (Signed)
Patient name: Nancy Norris MRN: [id number] DOB: 24-Sep-1940 Sex: female  REASON FOR VISIT:   To evaluate an aneurysm of her AV fistula.  The consult is requested by Dr. Marval Regal.  HPI:   Nancy Norris is a pleasant 77 y.o. female who had a left brachiocephalic fistula placed on 06/19/2013.  I saw him on follow-up on 07/29/2013.  At that time the fistula was maturing nicely.  I plan on seeing her back as needed.   The patient was seen by nephrology on 04/24/2017.  She has stage IV chronic kidney disease.  I have reviewed those records.  Her kidney issues are secondary to multiple myeloma with nephrotoxic agents.  On exam she was noted to have an enlarging aneurysm near the arterial side of her left upper arm fistula.  Labs at that time showed a creatinine of 2.66 with a GFR of 17.  Her fistula has become aneurysmal above the antecubital level.  She describes some mild pain with this but for the most part just does not like the way it looks.  Given that is continued to enlarge she would like to address this.  Past Medical History:  Diagnosis Date  . Anemia   . Anginal pain ([place])    used NTG x 2 May 31 and 06/15/13   . Anxiety   . Arthritis   . B12 deficiency 12/04/2014  . Breast cancer ([place])   . CKD (chronic kidney disease) stage 3, GFR 30-59 ml/min (HCC)   . Complication of anesthesia   . COPD (chronic obstructive pulmonary disease) ([place])   . Cryptococcal pneumonitis ([place]) 11/22/2015  . Depression   . Dizziness   . Dyspnea   . Fibromyalgia   . Fibromyalgia   . GERD (gastroesophageal reflux disease)   . Headache(784.0)   . Heart murmur   . Hemoptysis 10/21/2015  . History of blood transfusion    last one May 12   . Hx of cardiovascular stress test    LexiScan with low level exercise Myoview (02/2013): No ischemia, EF 72%; normal study  . Hx of echocardiogram    a.  Echocardiogram (12/26/2012): EF 09-32%, grade 1 diastolic dysfunction;   b.  Echocardiogram (02/2013): EF 55-60%, no  WMA, trivial effusion  . Hyperkalemia   . Hyponatremia   . Hypothyroidism   . Mucositis   . Multiple myeloma   . Myocardial infarction [facility])    in past, patient was unaware.   . Neuropathy   . Pain in joint, pelvic region and thigh 07/07/2015  . PONV (postoperative nausea and vomiting) 2008   after mastestomy    Family History  Problem Relation Age of Onset  . Arthritis Mother   . Asthma Mother   . Cancer Sister   . Hyperlipidemia Brother     SOCIAL HISTORY: Social History   Tobacco Use  . Smoking status: Former Smoker    Packs/day: 1.00    Years: 30.00    Pack years: 30.00    Types: Cigarettes    Last attempt to quit: 02/15/2005    Years since quitting: 12.2  . Smokeless tobacco: Never Used  Substance Use Topics  . Alcohol use: No    Allergies  Allergen Reactions  . Codeine Anaphylaxis  . Latex Shortness Of Breath    Adhesive products   . Other Other (See Comments)    Onion, chocolate causes migraines  . Onion Other (See Comments)    Causes migraine headaches  . Zyprexa [Olanzapine] Other (  See Comments)    Confusion , dizzy,unsteady  . Adhesive [Tape] Other (See Comments)    blisters  . Heparin Rash  . Hydrocodone Rash    With extreme itching  . Iodinated Diagnostic Agents Hives, Itching and Rash    Happened 60 years ago Stage IV kidney function  . Promethazine-Dm Hives  . Sulfa Antibiotics Itching    Current Outpatient Medications  Medication Sig Dispense Refill  . acetaminophen (TYLENOL) 325 MG tablet Take 650 mg by mouth every 6 (six) hours as needed for mild pain or moderate pain.     Marland Kitchen albuterol (PROVENTIL HFA;VENTOLIN HFA) 108 (90 Base) MCG/ACT inhaler Inhale 1-2 puffs into the lungs every 6 (six) hours as needed for wheezing or shortness of breath (cough). 1 Inhaler 3  . benzonatate (TESSALON PERLES) 100 MG capsule Take 2 capsules (200 mg total) by mouth 3 (three) times daily as needed for cough (Max: 61m per day). 60 capsule 0  .  butalbital-acetaminophen-caffeine (FIORICET, ESGIC) 50-325-40 MG tablet Take 1 tablet by mouth every 4 (four) hours as needed.  3  . Cholecalciferol 4000 UNITS TABS Take 4,000 Units by mouth daily.    . citalopram (CELEXA) 40 MG tablet TAKE 1 TABLET BY MOUTH DAILY OR AS DIRECTED (Patient taking differently: Take 40 mg by mouth daily) 90 tablet 0  . cromolyn (OPTICROM) 4 % ophthalmic solution Place 1 drop into both eyes 4 (four) times daily as needed (irritation).     .Marland Kitchendexamethasone (DECADRON) 4 MG tablet Take 1 tablet (4 mg total) by mouth daily. Take 10 tablets by mouth once a week as directed. 30 tablet 1  . folic acid (FOLVITE) 1 MG tablet Take 1 tablet (1 mg total) by mouth daily. 30 tablet 3  . furosemide (LASIX) 40 MG tablet TAKE 1 TABLET BY MOUTH DAILY. (Patient taking differently: Take 40 mg by mouth as needed for fluid) 90 tablet 1  . gabapentin (NEURONTIN) 300 MG capsule TAKE 1 CAPSULE BY MOUTH AT BEDTIME. 90 capsule 3  . levothyroxine (SYNTHROID, LEVOTHROID) 150 MCG tablet TAKE 1 TABLET BY MOUTH DAILY. NEED OFFICE VISIT FOR FURTHER REFILLS. 90 tablet 0  . loperamide (IMODIUM) 2 MG capsule Take 1 capsule (2 mg total) by mouth as needed for diarrhea or loose stools. 30 capsule 2  . magic mouthwash SOLN Take 5 mLs by mouth 4 (four) times daily as needed for mouth pain. 240 mL 1  . montelukast (SINGULAIR) 10 MG tablet TAKE 1 TABLET BY MOUTH DAILY. 90 tablet 1  . nitroGLYCERIN (NITROSTAT) 0.4 MG SL tablet Place 0.4 mg under the tongue every 5 (five) minutes as needed for chest pain. Reported on 03/30/2015    . nystatin (MYCOSTATIN/NYSTOP) powder Apply topically 2 (two) times daily. (Patient taking differently: Apply 1 g topically 2 (two) times daily as needed (rash). ) 30 g 0  . pilocarpine (SALAGEN) 5 MG tablet TAKE 1 TABLET BY MOUTH 3 TIMES DAILY FOR DRY MOUTH. 270 tablet 1  . predniSONE (DELTASONE) 20 MG tablet 2 tablets daily for 3 days, 1 tablet daily for 4 days. 10 tablet 0  .  [facility]    . ranitidine (ZANTAC) 300 MG tablet TAKE 1 TO 2 TABLETS DAILY FOR HEARTBURN & REFLUX 180 tablet 0  . senna (SENOKOT) 8.6 MG tablet Take 1 tablet by mouth at bedtime.    . traMADol (ULTRAM) 50 MG tablet Take 1 to 2 tablets by mouth 4 times a day as needed  for pain. (Patient taking differently: Take 50-100 mg by mouth every 6 (six) hours as needed for moderate pain or severe pain. ) 180 tablet 0  . vitamin B-12 (CYANOCOBALAMIN) 1000 MCG tablet Take 1 tablet (1,000 mcg total) by mouth daily. 30 tablet 0  . vitamin C (ASCORBIC ACID) 250 MG tablet Take 250 mg by mouth daily.     Current Facility-Administered Medications  Medication Dose Route Frequency Provider Last Rate Last Dose  . ipratropium-albuterol (DUONEB) 0.5-2.5 (3) MG/3ML nebulizer solution 3 mL  3 mL Nebulization Once Liane Comber, NP        REVIEW OF SYSTEMS:  _0  denotes positive finding, _1  denotes negative finding Cardiac  Comments:  Chest pain or chest pressure:    Shortness of breath upon exertion: x   Short of breath when lying flat: x   Irregular heart rhythm:        Vascular    Pain in calf, thigh, or hip brought on by ambulation:    Pain in feet at night that wakes you up from your sleep:     Blood clot in your veins:    Leg swelling:         Pulmonary    Oxygen at home:    Productive cough:     Wheezing:         Neurologic    Sudden weakness in arms or legs:     Sudden numbness in arms or legs:     Sudden onset of difficulty speaking or slurred speech:    Temporary loss of vision in one eye:     Problems with dizziness:         Gastrointestinal    Blood in stool:     Vomited blood:         Genitourinary    Burning when urinating:     Blood in urine:        Psychiatric    Major depression:  x       Hematologic    Bleeding problems:    Problems with blood clotting too easily:        Skin    Rashes or ulcers:        Constitutional    Fever or  chills:     PHYSICAL EXAM:   Vitals:   05/16/17 1243  BP: 118/71  Pulse: 83  Resp: 18  SpO2: 98%  Weight: 174 lb 12.8 oz (79.3 kg)  Height: _2  (1.651 m)    GENERAL: The patient is a well-nourished female, in no acute distress. The vital signs are documented above. CARDIAC: There is a regular rate and rhythm.  VASCULAR: She has a good thrill in her left upper arm fistula.  It is markedly aneurysmal just above the antecubital level. PULMONARY: There is good air exchange bilaterally without wheezing or rales. MUSCULOSKELETAL: There are no major deformities or cyanosis. NEUROLOGIC: No focal weakness or paresthesias are detected. SKIN: There are no ulcers or rashes noted. PSYCHIATRIC: The patient has a normal affect.  DATA:    No new data  MEDICAL ISSUES:    ANEURYSMAL LEFT UPPER ARM FISTULA: This fistula has been present for 5 years.  She is not on dialysis.  The proximal segment is markedly aneurysmal.  Given the continued enlargement I have recommended plication of the proximal fistula and she is agreeable with this.  We have discussed the procedure and potential complications and she is agreeable to proceed.  Her surgery scheduled on  06/04/2017.  We also discussed the options of simply following this.  In addition, she had expressed some concerns that she would not want to do dialysis in the future.  However, I strongly recommended against ligating the fistula.   Deitra Mayo [facility] [phone]

## 2017-05-17 ENCOUNTER — Other Ambulatory Visit: Payer: Self-pay | Admitting: *Deleted

## 2017-05-17 NOTE — Telephone Encounter (Signed)
Patient called to say that she got Becky's message about the time change for her surgery on 06-04-17. She states that she has made the decision to "tie off" her fistula even though she knows that Dr. Scot Dock has advised to only do the plication. I verified a second time that she wishes to have this AVF ligated. I have changed the booking on this case as per the patient decision.

## 2017-05-21 ENCOUNTER — Encounter (INDEPENDENT_AMBULATORY_CARE_PROVIDER_SITE_OTHER): Payer: Self-pay

## 2017-05-28 NOTE — Progress Notes (Signed)
FOLLOW UP  Assessment and Plan:   Nancy Norris was seen today for follow-up and hypothyroidism.  Diagnoses and all orders for this visit:  Chronic diastolic heart failure ([place]) Weights stable; no signs of overload Continue current medications Continue to monitor closely  Pulmonary emphysema, unspecified emphysema type ([name]) Symptoms stable; continue to monitor  Hypothyroidism, unspecified type continue medications the same pending lab results reminded to take on an empty stomach 30-55mns before food.  check TSH level -     TSH  CKD (chronic kidney disease) stage 5, GFR less than 15 ml/min (HCC) Continue follow up with nephrology -     COMPLETE METABOLIC PANEL WITH GFR  Vitamin D deficiency -     VITAMIN D 25 Hydroxy (Vit-D Deficiency, Fractures)  Medication management -     CBC with Differential/Platelet -     COMPLETE METABOLIC PANEL WITH GFR  Mixed hyperlipidemia Continue low cholesterol diet and exercise.  Check lipid panel.  -     Lipid panel -     TSH  Other abnormal glucose -     Hemoglobin A1c  Depression Continue celexa; starting wellbutrin at low dose with consideration of renal functions Start 75 mg BID, follow up in 3 months Encouraged her to reach out to family and church for positive grief sharing experiences.  Lifestyle discussed: diet/exerise, sleep hygiene, stress management, hydration   Continue diet and meds as discussed. Further disposition pending results of labs. Discussed med's effects and SE's.   Over 30 minutes of exam, counseling, chart review, and critical decision making was performed.   Future Appointments  Date Time Provider [place] 06/24/2017  1:00 PM [facility] 5 [facility] None  06/24/2017  1:30 [facility]    ----------------------------------------------------------------------------------------------------------------------  HPI 77y.o. female  presents for follow up on CHF,  COPD/emphysema, hyperlipidemia, hypothyroid, vitamin D deficiency. She presents reporting she is struggling with depression since her mother passed away last Fall. She is currently on celexa 40 mg daily.   She has hx breast cancer,- also currently on hiatus for chemotherapy for multiple myeloma not in remission, followed by Dr. MEarlie Server Port in place in R upper chest without apparent complication. She also has stage V CKD followed by nephrology; has fistula in left antecubital area/upper arm; intact without apparent complications but she reports she has not started dialysis and does not intend to do so, and has scheduled to have this reversed as there are concerns about it's excessive size. Denies SI, but reports she does not wish any further "life support" or prolonging measures.  She has a history of Diastolic HF (ECHO 51/93/7902LV EF 55-60%, mild mitral regurge, septal hypertrophy, denies dyspnea on exertion, orthopnea, paroxysmal nocturnal dyspnea and edema. Positive for none. Wt Readings from Last 3 Encounters:  05/29/17 176 lb 3.2 oz (79.9 kg)  05/16/17 174 lb 12.8 oz (79.3 kg)  04/18/17 174 lb (78.9 kg)   Today their BP is BP: 112/62 She denies chest pain, shortness of breath, dizziness.   She is not on cholesterol medication and denies myalgias. Her cholesterol is not at goal. The cholesterol last visit was:   Lab Results  Component Value Date   CHOL 174 05/30/2016   HDL 50 (L) 05/30/2016   LDLCALC 90 05/30/2016   TRIG 170 (H) 05/30/2016   CHOLHDL 3.5 05/30/2016    She has hx of prediabetes; Last A1C in the office was:  Lab Results  Component Value Date   HGBA1C  5.0 05/30/2016   She is on thyroid medication. Her medication was not changed last visit.   Lab Results  Component Value Date   TSH 2.73 05/30/2016   Patient is on Vitamin D supplement.   Lab Results  Component Value Date   VD25OH 75 02/20/2016        Current Medications:  Current Outpatient Medications on  File Prior to Visit  Medication Sig  . acetaminophen (TYLENOL) 325 MG tablet Take 650 mg by mouth every 6 (six) hours as needed for mild pain or moderate pain.   Marland Kitchen albuterol (PROVENTIL HFA;VENTOLIN HFA) 108 (90 Base) MCG/ACT inhaler Inhale 1-2 puffs into the lungs every 6 (six) hours as needed for wheezing or shortness of breath (cough).  . butalbital-acetaminophen-caffeine (FIORICET, ESGIC) 50-325-40 MG tablet Take 1 tablet by mouth every 4 (four) hours as needed.  . Cholecalciferol 4000 UNITS TABS Take 4,000 Units by mouth daily.  . citalopram (CELEXA) 40 MG tablet TAKE 1 TABLET BY MOUTH DAILY OR AS DIRECTED (Patient taking differently: Take 40 mg by mouth daily)  . cromolyn (OPTICROM) 4 % ophthalmic solution Place 1 drop into both eyes 4 (four) times daily as needed (irritation).   Marland Kitchen dexamethasone (DECADRON) 4 MG tablet Take 1 tablet (4 mg total) by mouth daily. Take 10 tablets by mouth once a week as directed.  . folic acid (FOLVITE) 1 MG tablet Take 1 tablet (1 mg total) by mouth daily.  . furosemide (LASIX) 40 MG tablet TAKE 1 TABLET BY MOUTH DAILY. (Patient taking differently: Take 40 mg by mouth as needed for fluid)  . gabapentin (NEURONTIN) 300 MG capsule TAKE 1 CAPSULE BY MOUTH AT BEDTIME.  Marland Kitchen levothyroxine (SYNTHROID, LEVOTHROID) 150 MCG tablet TAKE 1 TABLET BY MOUTH DAILY. NEED OFFICE VISIT FOR FURTHER REFILLS.  Marland Kitchen loperamide (IMODIUM) 2 MG capsule Take 1 capsule (2 mg total) by mouth as needed for diarrhea or loose stools.  . magic mouthwash SOLN Take 5 mLs by mouth 4 (four) times daily as needed for mouth pain.  . montelukast (SINGULAIR) 10 MG tablet TAKE 1 TABLET BY MOUTH DAILY.  . nitroGLYCERIN (NITROSTAT) 0.4 MG SL tablet Place 0.4 mg under the tongue every 5 (five) minutes as needed for chest pain. Reported on 03/30/2015  . nystatin (MYCOSTATIN/NYSTOP) powder Apply topically 2 (two) times daily. (Patient taking differently: Apply 1 g topically 2 (two) times daily as needed (rash). )   . pilocarpine (SALAGEN) 5 MG tablet TAKE 1 TABLET BY MOUTH 3 TIMES DAILY FOR DRY MOUTH.  . predniSONE (DELTASONE) 20 MG tablet 2 tablets daily for 3 days, 1 tablet daily for 4 days.  Marland Kitchen PRESCRIPTION MEDICATION Pt gets chemo at [facility].  . ranitidine (ZANTAC) 300 MG tablet TAKE 1 TO 2 TABLETS DAILY FOR HEARTBURN & REFLUX  . senna (SENOKOT) 8.6 MG tablet Take 1 tablet by mouth at bedtime.  . traMADol (ULTRAM) 50 MG tablet Take 1 to 2 tablets by mouth 4 times a day as needed for pain. (Patient taking differently: Take 50-100 mg by mouth every 6 (six) hours as needed for moderate pain or severe pain. )  . vitamin B-12 (CYANOCOBALAMIN) 1000 MCG tablet Take 1 tablet (1,000 mcg total) by mouth daily.  . vitamin C (ASCORBIC ACID) 250 MG tablet Take 250 mg by mouth daily.  . benzonatate (TESSALON PERLES) 100 MG capsule Take 2 capsules (200 mg total) by mouth 3 (three) times daily as needed for cough (Max: '600mg'$  per day). (Patient not taking: Reported on 05/29/2017)  Current Facility-Administered Medications on File Prior to Visit  Medication  . ipratropium-albuterol (DUONEB) 0.5-2.5 (3) MG/3ML nebulizer solution 3 mL     Allergies:  Allergies  Allergen Reactions  . Codeine Anaphylaxis  . Latex Shortness Of Breath    Adhesive products   . Other Other (See Comments)    Onion, chocolate causes migraines  . Onion Other (See Comments)    Causes migraine headaches  . Zyprexa [Olanzapine] Other (See Comments)    Confusion , dizzy,unsteady  . Adhesive [Tape] Other (See Comments)    blisters  . Heparin Rash  . Hydrocodone Rash    With extreme itching  . Iodinated Diagnostic Agents Hives, Itching and Rash    Happened 60 years ago Stage IV kidney function  . Promethazine-Dm Hives  . Sulfa Antibiotics Itching     Medical History:  Past Medical History:  Diagnosis Date  . Anemia   . Anginal pain ([place])    used NTG x 2 May 31 and 06/15/13   . Anxiety   . Arthritis   . B12 deficiency 12/04/2014  .  Breast cancer ([place])   . CKD (chronic kidney disease) stage 3, GFR 30-59 ml/min (HCC)   . Complication of anesthesia   . COPD (chronic obstructive pulmonary disease) ([place])   . 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 Depression   . Dizziness   . Dyspnea   . Fibromyalgia   . Fibromyalgia   . GERD (gastroesophageal reflux disease)   . Headache(784.0)   . Heart murmur   . Hemoptysis 10/21/2015  . History of blood transfusion    last one May 12   . Hx of cardiovascular stress test    LexiScan with low level exercise Myoview (02/2013): No ischemia, EF 72%; normal study  . Hx of echocardiogram    a.  Echocardiogram (12/26/2012): EF 60-63%, grade 1 diastolic dysfunction;   b.  Echocardiogram (02/2013): EF 55-60%, no WMA, trivial effusion  . Hyperkalemia   . Hyponatremia   . Hypothyroidism   . Mucositis   . Multiple myeloma   . Myocardial infarction [facility])    in past, patient was unaware.   . Neuropathy   . Pain in joint, pelvic region and thigh 07/07/2015  . PONV (postoperative nausea and vomiting) 2008   after mastestomy   Family history- Reviewed and unchanged Social history- Reviewed and unchanged   Review of Systems:  Review of Systems  Constitutional: Negative for malaise/fatigue and weight loss.  HENT: Negative for hearing loss and tinnitus.   Eyes: Negative for blurred vision and double vision.  Respiratory: Negative for cough, shortness of breath and wheezing.   Cardiovascular: Negative for chest pain, palpitations, orthopnea, claudication and leg swelling.  Gastrointestinal: Negative for abdominal pain, blood in stool, constipation, diarrhea, heartburn, melena, nausea and vomiting.  Genitourinary: Negative.   Musculoskeletal: Negative for joint pain and myalgias.  Skin: Negative for rash.  Neurological: Negative for dizziness, tingling, sensory change, weakness and headaches.  Endo/Heme/Allergies: Negative for polydipsia.  Psychiatric/Behavioral: Positive for  depression. Negative for substance abuse and suicidal ideas. The patient is not nervous/anxious and does not have insomnia.   All other systems reviewed and are negative.   Physical Exam: BP 112/62   Pulse 72   Temp 97.7 F (36.5 C)   Ht '5\' 5"'$  (1.651 m)   Wt 176 lb 3.2 oz (79.9 kg)   SpO2 95%   BMI 29.32 kg/m  Wt Readings from Last 3 Encounters:  05/29/17 176 lb 3.2  oz (79.9 kg)  05/16/17 174 lb 12.8 oz (79.3 kg)  04/18/17 174 lb (78.9 kg)   General Appearance: Well nourished, in no apparent distress. Eyes: PERRLA, EOMs, conjunctiva no swelling or erythema Sinuses: No Frontal/maxillary tenderness ENT/Mouth: Ext aud canals clear, TMs without erythema, bulging. No erythema, swelling, or exudate on post pharynx.  Tonsils not swollen or erythematous. Hearing normal.  Neck: Supple, thyroid normal.  Respiratory: Respiratory effort normal, BS equal bilaterally without rales, rhonchi, wheezing or stridor.  Cardio: RRR with no MRGs. Brisk peripheral pulses without edema.  Abdomen: Soft, + BS.  Non tender, no guarding, rebound, hernias, masses. Lymphatics: Non tender without lymphadenopathy.  Musculoskeletal: Full ROM, 5/5 strength, Normal gait Skin: Warm, dry without rashes, lesions, ecchymosis.  Neuro: Cranial nerves intact. No cerebellar symptoms.  Psych: Awake and oriented X 3, normal affect, Insight and Judgment appropriate.    Nancy Ribas, NP 10:04 AM [facility]

## 2017-05-29 ENCOUNTER — Ambulatory Visit (INDEPENDENT_AMBULATORY_CARE_PROVIDER_SITE_OTHER): Payer: Medicare Other | Admitting: Adult Health

## 2017-05-29 ENCOUNTER — Encounter: Payer: Self-pay | Admitting: Adult Health

## 2017-05-29 VITALS — BP 112/62 | HR 72 | Temp 97.7°F | Ht 65.0 in | Wt 176.2 lb

## 2017-05-29 DIAGNOSIS — J439 Emphysema, unspecified: Secondary | ICD-10-CM | POA: Diagnosis not present

## 2017-05-29 DIAGNOSIS — E782 Mixed hyperlipidemia: Secondary | ICD-10-CM

## 2017-05-29 DIAGNOSIS — N185 Chronic kidney disease, stage 5: Secondary | ICD-10-CM

## 2017-05-29 DIAGNOSIS — E039 Hypothyroidism, unspecified: Secondary | ICD-10-CM

## 2017-05-29 DIAGNOSIS — R05 Cough: Secondary | ICD-10-CM | POA: Diagnosis not present

## 2017-05-29 DIAGNOSIS — R7309 Other abnormal glucose: Secondary | ICD-10-CM | POA: Diagnosis not present

## 2017-05-29 DIAGNOSIS — Z79899 Other long term (current) drug therapy: Secondary | ICD-10-CM | POA: Diagnosis not present

## 2017-05-29 DIAGNOSIS — R059 Cough, unspecified: Secondary | ICD-10-CM

## 2017-05-29 DIAGNOSIS — J441 Chronic obstructive pulmonary disease with (acute) exacerbation: Secondary | ICD-10-CM

## 2017-05-29 DIAGNOSIS — I5032 Chronic diastolic (congestive) heart failure: Secondary | ICD-10-CM | POA: Diagnosis not present

## 2017-05-29 DIAGNOSIS — F331 Major depressive disorder, recurrent, moderate: Secondary | ICD-10-CM | POA: Diagnosis not present

## 2017-05-29 DIAGNOSIS — E559 Vitamin D deficiency, unspecified: Secondary | ICD-10-CM

## 2017-05-29 MED ORDER — BUPROPION HCL 75 MG PO TABS: 75.0000 mg | ORAL_TABLET | Freq: Two times a day (BID) | ORAL | 2 refills | Status: AC

## 2017-05-29 MED ORDER — PREDNISONE 20 MG PO TABS: ORAL_TABLET | ORAL | 0 refills | Status: DC

## 2017-05-29 NOTE — Patient Instructions (Signed)
Coping With Loss, Adult People experience loss in many different ways throughout their lives. Events such as moving, changing jobs, and losing friends can create a sense of loss. The loss may be as serious as a major health change, divorce, death of a pet, or death of a loved one. All of these types of loss are likely to create a physical and emotional reaction known as grief. Grief is the result of a major change or an absence of something or someone that you count on. Grief is a normal reaction to loss. How to recognize changes A variety of factors can affect your grieving experience, including:  The nature of your loss.  Your relationship to what or whom you lost.  Your understanding of grief and how to cope with it.  Your support system.  The way that you deal with your grief will affect your ability to function as you normally do. When you are grieving, you may experience:  Numbness, shock, sadness, anxiety, anger, denial, and guilt.  Thoughts about death.  Unexpected crying.  A physical sensation of emptiness in your gut.  Problems sleeping and eating.  Fatigue.  Loss of interest in normal activities.  Dreaming about or imagining seeing the person who died.  A need to remember what or whom you lost.  Difficulty thinking about anything other than your loss for a period of time.  Relief. If you have been expecting the loss for a while, you may feel a sense of relief when it happens.  Where to find support To get support for coping with loss:  Ask your health care provider for help and recommendations, such as grief counseling or therapy.  Think about joining a support group for people who are coping with loss.  Follow these instructions at home:  Be patient with yourself and others. Allow the grieving process to happen, and remember that grieving takes time. ? It is likely that you may never feel completely done with some grief. You may find a way to move on while  still cherishing memories and feelings about your loss. ? Accepting your loss is a process. It can take months or longer to adjust.  Express your feelings in healthy ways, such as: ? Talking with others about your loss. It may be helpful to find others who have had a similar loss, such as a support group. ? Writing down your feelings in a journal. ? Doing physical activities to release stress and emotional energy. ? Doing creative activities like painting, sculpting, or playing or listening to music. ? Practicing resilience. This is the ability to recover and adjust after facing challenges. Reading some resources that encourage resilience may help you to learn ways to practice those behaviors.  Keep to your normal routine as much as possible. If you have trouble focusing or doing normal activities, it is acceptable to take some time away from your normal routine.  Spend time with friends and loved ones.  Eat a healthy diet, get plenty of sleep, and rest when you feel tired. Where to find more information: You can find more information about coping with loss from:  American Society of Clinical Oncology: www.cancer.net  American Psychological Association: TVStereos.ch  Contact a health care provider if:  Your grief is extreme and keeps getting worse.  You have ongoing grief that does not improve.  Your body shows symptoms of grief, such as illness.  You feel depressed, anxious, or lonely. Get help right away if:  You have  thoughts about hurting yourself or others. If you ever feel like you may hurt yourself or others, or have thoughts about taking your own life, get help right away. You can go to your nearest emergency department or call:  Your local emergency services (911 in the [place]  A suicide crisis helpline, such as the [facility] at 7871348723. This is open 24 hours a day.  Summary  Grief is a normal part of experiencing a loss. It is the  result of a major change or an absence of something or someone that you count on.  The depth of grief and the period of recovery depend on the type of loss as well as your ability to adjust to the change and process your feelings.  Processing grief requires patience and a willingness to accept your feelings and talk about your loss with people who are supportive.  It is important to find resources that work for you and to realize that we are all different when it comes to grief. There is not one single grieving process that works for everyone in the same way.  Be aware that when grief becomes extreme, it can lead to more severe issues like isolation, depression, anxiety, or suicidal thoughts. Talk with your health care provider if you have any of these issues. This information is not intended to replace advice given to you by your health care provider. Make sure you discuss any questions you have with your health care provider. Document Released: 05/17/2016 Document Revised: 05/17/2016 Document Reviewed: 05/17/2016 Elsevier Interactive Patient Education  2018 [employer].

## 2017-05-30 ENCOUNTER — Other Ambulatory Visit: Payer: Self-pay | Admitting: Adult Health

## 2017-05-30 DIAGNOSIS — E039 Hypothyroidism, unspecified: Secondary | ICD-10-CM

## 2017-05-30 LAB — CBC WITH DIFFERENTIAL/PLATELET
BASOS ABS: 22 {cells}/uL (ref 0–200)
Basophils Relative: 0.6 %
EOS PCT: 6.5 %
Eosinophils Absolute: 234 cells/uL (ref 15–500)
HEMATOCRIT: 30.2 % — AB (ref 35.0–45.0)
Hemoglobin: 10.2 g/dL — ABNORMAL LOW (ref 11.7–15.5)
LYMPHS ABS: 911 {cells}/uL (ref 850–3900)
MCH: 36 pg — ABNORMAL HIGH (ref 27.0–33.0)
MCHC: 33.8 g/dL (ref 32.0–36.0)
MCV: 106.7 fL — ABNORMAL HIGH (ref 80.0–100.0)
Monocytes Relative: 14 %
NEUTROS PCT: 53.6 %
Neutro Abs: 1930 cells/uL (ref 1500–7800)
RBC: 2.83 10*6/uL — AB (ref 3.80–5.10)
RDW: 13.9 % (ref 11.0–15.0)
Total Lymphocyte: 25.3 %
WBC mixed population: 504 cells/uL (ref 200–950)
WBC: 3.6 10*3/uL — AB (ref 3.8–10.8)

## 2017-05-30 LAB — HEMOGLOBIN A1C
Hgb A1c MFr Bld: 5 % of total Hgb (ref <5.7)
Mean Plasma Glucose: 97 (calc)
eAG (mmol/L): 5.4 (calc)

## 2017-05-30 LAB — LIPID PANEL
CHOL/HDL RATIO: 4.3 (calc) (ref <5.0)
CHOLESTEROL: 193 mg/dL (ref <200)
HDL: 45 mg/dL — AB (ref >50)
LDL CHOLESTEROL (CALC): 124 mg/dL — AB
Non-HDL Cholesterol (Calc): 148 mg/dL (calc) — ABNORMAL HIGH (ref <130)
TRIGLYCERIDES: 126 mg/dL (ref <150)

## 2017-05-30 LAB — COMPLETE METABOLIC PANEL WITH GFR
AG RATIO: 2.4 (calc) (ref 1.0–2.5)
ALKALINE PHOSPHATASE (APISO): 80 U/L (ref 33–130)
ALT: 10 U/L (ref 6–29)
AST: 13 U/L (ref 10–35)
Albumin: 4.1 g/dL (ref 3.6–5.1)
BUN/Creatinine Ratio: 14 (calc) (ref 6–22)
BUN: 33 mg/dL — ABNORMAL HIGH (ref 7–25)
CO2: 26 mmol/L (ref 20–32)
CREATININE: 2.42 mg/dL — AB (ref 0.60–0.93)
Calcium: 8.9 mg/dL (ref 8.6–10.4)
Chloride: 107 mmol/L (ref 98–110)
GFR, Est African American: 22 mL/min/{1.73_m2} — ABNORMAL LOW (ref >=60)
GFR, Est Non African American: 19 mL/min/{1.73_m2} — ABNORMAL LOW (ref >=60)
GLOBULIN: 1.7 g/dL — AB (ref 1.9–3.7)
Glucose, Bld: 109 mg/dL — ABNORMAL HIGH (ref 65–99)
POTASSIUM: 4.3 mmol/L (ref 3.5–5.3)
SODIUM: 142 mmol/L (ref 135–146)
Total Bilirubin: 0.8 mg/dL (ref 0.2–1.2)
Total Protein: 5.8 g/dL — ABNORMAL LOW (ref 6.1–8.1)

## 2017-05-30 LAB — VITAMIN D 25 HYDROXY (VIT D DEFICIENCY, FRACTURES): Vit D, 25-Hydroxy: 48 ng/mL (ref 30–100)

## 2017-05-30 LAB — TSH: TSH: 25.02 mIU/L — ABNORMAL HIGH (ref 0.40–4.50)

## 2017-06-03 ENCOUNTER — Other Ambulatory Visit: Payer: Self-pay

## 2017-06-03 ENCOUNTER — Encounter (HOSPITAL_COMMUNITY): Payer: Self-pay | Admitting: *Deleted

## 2017-06-04 ENCOUNTER — Ambulatory Visit (HOSPITAL_COMMUNITY): Payer: Medicare Other | Admitting: Anesthesiology

## 2017-06-04 ENCOUNTER — Encounter (HOSPITAL_COMMUNITY)
Admission: RE | Disposition: A | Discharge status: Home / Self Care | Payer: Self-pay | Source: Ambulatory Visit | Attending: Vascular Surgery

## 2017-06-04 ENCOUNTER — Encounter (INDEPENDENT_AMBULATORY_CARE_PROVIDER_SITE_OTHER): Payer: Self-pay

## 2017-06-04 ENCOUNTER — Ambulatory Visit (HOSPITAL_COMMUNITY)
Admission: RE | Admit: 2017-06-04 | Discharge: 2017-06-04 | Disposition: A | Discharge status: Home / Self Care | Payer: Medicare Other | Source: Ambulatory Visit | Attending: Vascular Surgery | Admitting: Vascular Surgery

## 2017-06-04 ENCOUNTER — Encounter (HOSPITAL_COMMUNITY): Payer: Self-pay | Admitting: *Deleted

## 2017-06-04 DIAGNOSIS — D631 Anemia in chronic kidney disease: Secondary | ICD-10-CM | POA: Insufficient documentation

## 2017-06-04 DIAGNOSIS — F419 Anxiety disorder, unspecified: Secondary | ICD-10-CM | POA: Diagnosis not present

## 2017-06-04 DIAGNOSIS — Z91041 Radiographic dye allergy status: Secondary | ICD-10-CM | POA: Insufficient documentation

## 2017-06-04 DIAGNOSIS — Z882 Allergy status to sulfonamides status: Secondary | ICD-10-CM | POA: Insufficient documentation

## 2017-06-04 DIAGNOSIS — E039 Hypothyroidism, unspecified: Secondary | ICD-10-CM | POA: Diagnosis not present

## 2017-06-04 DIAGNOSIS — Z87891 Personal history of nicotine dependence: Secondary | ICD-10-CM | POA: Diagnosis not present

## 2017-06-04 DIAGNOSIS — M797 Fibromyalgia: Secondary | ICD-10-CM | POA: Insufficient documentation

## 2017-06-04 DIAGNOSIS — N184 Chronic kidney disease, stage 4 (severe): Secondary | ICD-10-CM | POA: Insufficient documentation

## 2017-06-04 DIAGNOSIS — F329 Major depressive disorder, single episode, unspecified: Secondary | ICD-10-CM | POA: Diagnosis not present

## 2017-06-04 DIAGNOSIS — T82898A Other specified complication of vascular prosthetic devices, implants and grafts, initial encounter: Secondary | ICD-10-CM | POA: Insufficient documentation

## 2017-06-04 DIAGNOSIS — Y832 Surgical operation with anastomosis, bypass or graft as the cause of abnormal reaction of the patient, or of later complication, without mention of misadventure at the time of the procedure: Secondary | ICD-10-CM | POA: Insufficient documentation

## 2017-06-04 DIAGNOSIS — E538 Deficiency of other specified B group vitamins: Secondary | ICD-10-CM | POA: Insufficient documentation

## 2017-06-04 DIAGNOSIS — C9000 Multiple myeloma not having achieved remission: Secondary | ICD-10-CM | POA: Insufficient documentation

## 2017-06-04 DIAGNOSIS — I252 Old myocardial infarction: Secondary | ICD-10-CM | POA: Diagnosis not present

## 2017-06-04 DIAGNOSIS — G629 Polyneuropathy, unspecified: Secondary | ICD-10-CM | POA: Diagnosis not present

## 2017-06-04 DIAGNOSIS — K219 Gastro-esophageal reflux disease without esophagitis: Secondary | ICD-10-CM | POA: Insufficient documentation

## 2017-06-04 DIAGNOSIS — Z91040 Latex allergy status: Secondary | ICD-10-CM | POA: Insufficient documentation

## 2017-06-04 DIAGNOSIS — I120 Hypertensive chronic kidney disease with stage 5 chronic kidney disease or end stage renal disease: Secondary | ICD-10-CM | POA: Diagnosis not present

## 2017-06-04 DIAGNOSIS — M1990 Unspecified osteoarthritis, unspecified site: Secondary | ICD-10-CM | POA: Insufficient documentation

## 2017-06-04 DIAGNOSIS — I129 Hypertensive chronic kidney disease with stage 1 through stage 4 chronic kidney disease, or unspecified chronic kidney disease: Secondary | ICD-10-CM | POA: Diagnosis not present

## 2017-06-04 DIAGNOSIS — J449 Chronic obstructive pulmonary disease, unspecified: Secondary | ICD-10-CM | POA: Insufficient documentation

## 2017-06-04 DIAGNOSIS — I5032 Chronic diastolic (congestive) heart failure: Secondary | ICD-10-CM | POA: Diagnosis not present

## 2017-06-04 DIAGNOSIS — N185 Chronic kidney disease, stage 5: Secondary | ICD-10-CM | POA: Diagnosis not present

## 2017-06-04 HISTORY — DX: Pneumonia, unspecified organism: J18.9

## 2017-06-04 HISTORY — PX: RESECTION OF ARTERIOVENOUS FISTULA ANEURYSM: SHX6070

## 2017-06-04 HISTORY — DX: Hypotension, unspecified: I95.9

## 2017-06-04 LAB — POCT I-STAT 4, (NA,K, GLUC, HGB,HCT)
GLUCOSE: 100 mg/dL — AB (ref 65–99)
HCT: 34 % — ABNORMAL LOW (ref 36.0–46.0)
Hemoglobin: 11.6 g/dL — ABNORMAL LOW (ref 12.0–15.0)
POTASSIUM: 3.8 mmol/L (ref 3.5–5.1)
Sodium: 139 mmol/L (ref 135–145)

## 2017-06-04 SURGERY — RESECTION OF ARTERIOVENOUS FISTULA ANEURYSM
Anesthesia: Monitor Anesthesia Care | Laterality: Left

## 2017-06-04 MED ORDER — FENTANYL CITRATE (PF) 250 MCG/5ML IJ SOLN
INTRAMUSCULAR | Status: AC
  Filled 2017-06-04: qty 5

## 2017-06-04 MED ORDER — MIDAZOLAM HCL 5 MG/5ML IJ SOLN
INTRAMUSCULAR | Status: DC | PRN
  Administered 2017-06-04: 2 mg via INTRAVENOUS

## 2017-06-04 MED ORDER — OXYCODONE HCL 5 MG PO TABS: 5.0000 mg | ORAL_TABLET | ORAL | 0 refills | Status: DC | PRN

## 2017-06-04 MED ORDER — LIDOCAINE HCL (PF) 1 % IJ SOLN
INTRAMUSCULAR | Status: DC | PRN
  Administered 2017-06-04: 30 mL

## 2017-06-04 MED ORDER — FENTANYL CITRATE (PF) 100 MCG/2ML IJ SOLN
INTRAMUSCULAR | Status: DC | PRN
  Administered 2017-06-04 (×2): 50 ug via INTRAVENOUS

## 2017-06-04 MED ORDER — ONDANSETRON HCL 4 MG/2ML IJ SOLN
4.0000 mg | Freq: Once | INTRAMUSCULAR | Status: AC
  Administered 2017-06-04: 4 mg via INTRAVENOUS
  Filled 2017-06-04: qty 2

## 2017-06-04 MED ORDER — 0.9 % SODIUM CHLORIDE (POUR BTL) OPTIME
TOPICAL | Status: DC | PRN
  Administered 2017-06-04: 1000 mL

## 2017-06-04 MED ORDER — MIDAZOLAM HCL 2 MG/2ML IJ SOLN
INTRAMUSCULAR | Status: AC
  Filled 2017-06-04: qty 2

## 2017-06-04 MED ORDER — LIDOCAINE HCL (PF) 1 % IJ SOLN
INTRAMUSCULAR | Status: AC
  Filled 2017-06-04: qty 30

## 2017-06-04 MED ORDER — CHLORHEXIDINE GLUCONATE 4 % EX LIQD: 60.0000 mL | Freq: Once | CUTANEOUS | Status: DC

## 2017-06-04 MED ORDER — LIDOCAINE-EPINEPHRINE (PF) 1 %-1:200000 IJ SOLN
INTRAMUSCULAR | Status: DC | PRN
  Administered 2017-06-04: 30 mL

## 2017-06-04 MED ORDER — ONDANSETRON HCL 4 MG/2ML IJ SOLN
INTRAMUSCULAR | Status: AC
  Filled 2017-06-04: qty 2

## 2017-06-04 MED ORDER — LIDOCAINE-EPINEPHRINE (PF) 1 %-1:200000 IJ SOLN
INTRAMUSCULAR | Status: AC
  Filled 2017-06-04: qty 30

## 2017-06-04 MED ORDER — ONDANSETRON HCL 4 MG/2ML IJ SOLN
4.0000 mg | Freq: Once | INTRAMUSCULAR | Status: DC
  Filled 2017-06-04: qty 2

## 2017-06-04 MED ORDER — LIDOCAINE HCL (CARDIAC) PF 100 MG/5ML IV SOSY
PREFILLED_SYRINGE | INTRAVENOUS | Status: DC | PRN
  Administered 2017-06-04: 60 mg via INTRAVENOUS

## 2017-06-04 MED ORDER — PROPOFOL 10 MG/ML IV BOLUS
INTRAVENOUS | Status: DC | PRN
  Administered 2017-06-04: 50 mg via INTRAVENOUS

## 2017-06-04 MED ORDER — FENTANYL CITRATE (PF) 100 MCG/2ML IJ SOLN
25.0000 ug | INTRAMUSCULAR | Status: DC | PRN
  Administered 2017-06-04: 50 ug via INTRAVENOUS

## 2017-06-04 MED ORDER — FENTANYL CITRATE (PF) 100 MCG/2ML IJ SOLN: 25.0000 ug | INTRAMUSCULAR | Status: DC | PRN

## 2017-06-04 MED ORDER — SODIUM CHLORIDE 0.9 % IV SOLN
INTRAVENOUS | Status: DC
  Administered 2017-06-04: 25 mL/h via INTRAVENOUS
  Administered 2017-06-04: 11:00:00 via INTRAVENOUS

## 2017-06-04 MED ORDER — CEFAZOLIN SODIUM-DEXTROSE 2-4 GM/100ML-% IV SOLN
2.0000 g | INTRAVENOUS | Status: AC
  Administered 2017-06-04: 2 g via INTRAVENOUS
  Filled 2017-06-04: qty 100

## 2017-06-04 MED ORDER — DEXAMETHASONE SODIUM PHOSPHATE 10 MG/ML IJ SOLN
INTRAMUSCULAR | Status: AC
  Filled 2017-06-04: qty 1

## 2017-06-04 MED ORDER — DEXAMETHASONE SODIUM PHOSPHATE 10 MG/ML IJ SOLN
INTRAMUSCULAR | Status: DC | PRN
  Administered 2017-06-04: 10 mg via INTRAVENOUS

## 2017-06-04 MED ORDER — ROCURONIUM BROMIDE 10 MG/ML (PF) SYRINGE
PREFILLED_SYRINGE | INTRAVENOUS | Status: AC
  Filled 2017-06-04: qty 5

## 2017-06-04 MED ORDER — FENTANYL CITRATE (PF) 100 MCG/2ML IJ SOLN
INTRAMUSCULAR | Status: AC
  Filled 2017-06-04: qty 2

## 2017-06-04 MED ORDER — ONDANSETRON HCL 4 MG/2ML IJ SOLN
INTRAMUSCULAR | Status: AC
  Administered 2017-06-04: 4 mg via INTRAVENOUS
  Filled 2017-06-04: qty 2

## 2017-06-04 MED ORDER — PROPOFOL 10 MG/ML IV BOLUS
INTRAVENOUS | Status: AC
  Filled 2017-06-04: qty 20

## 2017-06-04 MED ORDER — PROPOFOL 500 MG/50ML IV EMUL
INTRAVENOUS | Status: DC | PRN
  Administered 2017-06-04: 50 ug/kg/min via INTRAVENOUS

## 2017-06-04 SURGICAL SUPPLY — 43 items
ADH SKN CLS APL DERMABOND .7 (GAUZE/BANDAGES/DRESSINGS) ×1
ARMBAND PINK RESTRICT EXTREMIT (MISCELLANEOUS) ×2 IMPLANT
CANISTER SUCT 3000ML PPV (MISCELLANEOUS) ×2 IMPLANT
CANNULA VESSEL 3MM 2 BLNT TIP (CANNULA) ×1 IMPLANT
CLIP VESOCCLUDE MED 6/CT (CLIP) ×2 IMPLANT
CLIP VESOCCLUDE SM WIDE 6/CT (CLIP) ×2 IMPLANT
COVER PROBE W GEL 5X96 (DRAPES) ×2 IMPLANT
DECANTER SPIKE VIAL GLASS SM (MISCELLANEOUS) ×3 IMPLANT
DERMABOND ADVANCED (GAUZE/BANDAGES/DRESSINGS) ×1
DERMABOND ADVANCED .7 DNX12 (GAUZE/BANDAGES/DRESSINGS) ×1 IMPLANT
DRAIN PENROSE 1/2X12 LTX STRL (WOUND CARE) IMPLANT
ELECT REM PT RETURN 9FT ADLT (ELECTROSURGICAL) ×2
ELECTRODE REM PT RTRN 9FT ADLT (ELECTROSURGICAL) ×1 IMPLANT
GAUZE SPONGE 4X4 12PLY STRL (GAUZE/BANDAGES/DRESSINGS) ×2 IMPLANT
GLOVE BIO SURGEON STRL SZ7.5 (GLOVE) ×2 IMPLANT
GLOVE BIOGEL PI IND STRL 6.5 (GLOVE) IMPLANT
GLOVE BIOGEL PI IND STRL 7.5 (GLOVE) IMPLANT
GLOVE BIOGEL PI IND STRL 8 (GLOVE) ×1 IMPLANT
GLOVE BIOGEL PI INDICATOR 6.5 (GLOVE) ×3
GLOVE BIOGEL PI INDICATOR 7.5 (GLOVE) ×1
GLOVE BIOGEL PI INDICATOR 8 (GLOVE) ×1
GLOVE SURG SS PI 6.5 STRL IVOR (GLOVE) ×1 IMPLANT
GLOVE SURG SS PI 7.0 STRL IVOR (GLOVE) ×1 IMPLANT
GOWN STRL REUS W/ TWL LRG LVL3 (GOWN DISPOSABLE) ×3 IMPLANT
GOWN STRL REUS W/TWL LRG LVL3 (GOWN DISPOSABLE) ×6
KIT BASIN OR (CUSTOM PROCEDURE TRAY) ×2 IMPLANT
KIT TURNOVER KIT B (KITS) ×2 IMPLANT
NDL HYPO 25GX1X1/2 BEV (NEEDLE) IMPLANT
NEEDLE HYPO 25GX1X1/2 BEV (NEEDLE) ×4 IMPLANT
NS IRRIG 1000ML POUR BTL (IV SOLUTION) ×2 IMPLANT
PACK CV ACCESS (CUSTOM PROCEDURE TRAY) ×2 IMPLANT
PAD ARMBOARD 7.5X6 YLW CONV (MISCELLANEOUS) ×4 IMPLANT
SPONGE SURGIFOAM ABS GEL 100 (HEMOSTASIS) IMPLANT
SUT MNCRL AB 4-0 PS2 18 (SUTURE) ×1 IMPLANT
SUT PROLENE 5 0 C 1 36 (SUTURE) ×1 IMPLANT
SUT PROLENE 6 0 BV (SUTURE) ×3 IMPLANT
SUT VIC AB 3-0 SH 27 (SUTURE) ×4
SUT VIC AB 3-0 SH 27X BRD (SUTURE) ×1 IMPLANT
SUT VICRYL 4-0 PS2 18IN ABS (SUTURE) ×2 IMPLANT
SYR CONTROL 10ML LL (SYRINGE) ×1 IMPLANT
TOWEL GREEN STERILE (TOWEL DISPOSABLE) ×2 IMPLANT
UNDERPAD 30X30 (UNDERPADS AND DIAPERS) ×2 IMPLANT
WATER STERILE IRR 1000ML POUR (IV SOLUTION) ×2 IMPLANT

## 2017-06-04 NOTE — Anesthesia Postprocedure Evaluation (Signed)
Anesthesia Post Note  Patient: Nancy Norris  Procedure(s) Performed: LIGATION ANEURYSM OF LEFT ARTERIOVENOUS FISTULA (Left )     Patient location during evaluation: PACU Anesthesia Type: MAC Level of consciousness: awake Pain management: pain level controlled Respiratory status: spontaneous breathing Anesthetic complications: no    Last Vitals:  Vitals:   06/04/17 1255 06/04/17 1312  BP:  (!) 134/53  Pulse:  65  Resp:  15  Temp:  36.4 C  SpO2: 98% 99%    Last Pain:  Vitals:   06/04/17 1255  TempSrc:   PainSc: 0-No pain                 Saim Almanza

## 2017-06-04 NOTE — Progress Notes (Addendum)
Patient complaining of some nausea, no vomitus. She feels some better after receiving IV zofran.

## 2017-06-04 NOTE — Interval H&P Note (Signed)
History and Physical Interval Note:  06/04/2017 10:14 AM  Nancy Norris  has presented today for surgery, with the diagnosis of ANEURYSM OF LEFT ARTERIOVENOUS FISTULA  The various methods of treatment have been discussed with the patient and family. After consideration of risks, benefits and other options for treatment, the patient has consented to  Procedure(s): LIGATION ANEURYSM OF LEFT ARTERIOVENOUS FISTULA (Left) as a surgical intervention .  The patient's history has been reviewed, patient examined, no change in status, stable for surgery.  I have reviewed the patient's chart and labs.  Questions were answered to the patient's satisfaction.     Deitra Mayo

## 2017-06-04 NOTE — Anesthesia Procedure Notes (Signed)
Procedure Name: MAC Date/Time: 06/04/2017 10:42 AM Performed by: Izora Gala, CRNA Pre-anesthesia Checklist: Patient identified, Emergency Drugs available and Suction available Patient Re-evaluated:Patient Re-evaluated prior to induction Oxygen Delivery Method: Simple face mask Preoxygenation: Pre-oxygenation with 100% oxygen Induction Type: IV induction Ventilation: Oral airway inserted - appropriate to patient size Placement Confirmation: positive ETCO2

## 2017-06-04 NOTE — Transfer of Care (Signed)
Immediate Anesthesia Transfer of Care Note  Patient: Nancy Norris  Procedure(s) Performed: LIGATION ANEURYSM OF LEFT ARTERIOVENOUS FISTULA (Left )  Patient Location: PACU  Anesthesia Type:MAC  Level of Consciousness: awake, alert  and patient cooperative  Airway & Oxygen Therapy: Patient Spontanous Breathing  Post-op Assessment: Report given to RN, Post -op Vital signs reviewed and stable and Patient moving all extremities  Post vital signs: Reviewed and stable  Last Vitals:  Vitals Value Taken Time  BP 121/52 06/04/2017 11:41 AM  Temp 36.6 C 06/04/2017 11:42 AM  Pulse 68 06/04/2017 11:44 AM  Resp 13 06/04/2017 11:44 AM  SpO2 96 % 06/04/2017 11:44 AM  Vitals shown include unvalidated device data.  Last Pain:  Vitals:   06/04/17 1142  TempSrc:   PainSc: Asleep         Complications: No apparent anesthesia complications

## 2017-06-04 NOTE — Op Note (Signed)
    NAME: Nancy Norris    MRN: [id number] DOB: Mar 27, 1940    DATE OF OPERATION: 06/04/2017  PREOP DIAGNOSIS:    Aneurysmal left upper arm fistula  POSTOP DIAGNOSIS:    Same  PROCEDURE:    Ligation of left brachiocephalic fistula and excision of large aneurysm  SURGEON: Judeth Cornfield. Scot Dock, MD, FACS  ASSIST: Laurence Slate, PA  ANESTHESIA: Local with sedation  EBL: Minimal  INDICATIONS:    Nancy Norris is a 77 y.o. female who is not on dialysis.  She had a left brachiocephalic fistula placed which was never used.  She presented with a large aneurysm.  We discussed plication however she wished to have the fistula ligated and the aneurysm excised.  FINDINGS:   Palpable radial pulse at the completion of the procedure.  TECHNIQUE:   The patient was taken to the operating room and sedated by anesthesia.  The left arm was prepped and draped in usual sterile fashion.  An incision was marked.  The width of the area to be excised was one third the distance of the length to prevent any dog ear formation.  After the skin was anesthetized the skin incision was made down to the aneurysm.  This was dissected free circumferentially.  It was taken down to where it was anastomosed to the radial artery and ligated with a 2-0 silk tie here.  There was a good radial pulse at the completion.  Next it was ligated distally.  The aneurysm was excised. The deep layer was closed with interrupted 3-0 Vicryl. The skin was closed with 4-0 Vicryl.  Dermabond was applied.  The patient tolerated the procedure well was transferred to the recovery room in stable condition.  All needle and sponge counts were correct.  Nancy Mayo, MD, FACS [facility]  DATE OF DICTATION:   06/04/2017

## 2017-06-04 NOTE — Anesthesia Preprocedure Evaluation (Addendum)
Anesthesia Evaluation  Patient identified by MRN, date of birth, ID band Patient awake    Reviewed: Allergy & Precautions, NPO status , Patient's Chart, lab work & pertinent test results  History of Anesthesia Complications (+) PONV  Airway Mallampati: II  TM Distance: >3 FB     Dental   Pulmonary shortness of breath, asthma , COPD, former smoker,    breath sounds clear to auscultation       Cardiovascular hypertension, + angina + Past MI  + Valvular Problems/Murmurs  Rhythm:Regular Rate:Normal     Neuro/Psych    GI/Hepatic GERD  ,  Endo/Other  diabetesHypothyroidism   Renal/GU Renal disease     Musculoskeletal  (+) Arthritis , Fibromyalgia -  Abdominal   Peds  Hematology  (+) anemia ,   Anesthesia Other Findings   Reproductive/Obstetrics                            Anesthesia Physical Anesthesia Plan  ASA: III  Anesthesia Plan: MAC   Post-op Pain Management:    Induction: Intravenous  PONV Risk Score and Plan: 2 and Treatment may vary due to age or medical condition, Ondansetron, Dexamethasone and Midazolam  Airway Management Planned: Nasal Cannula and Simple Face Mask  Additional Equipment:   Intra-op Plan:   Post-operative Plan:   Informed Consent: I have reviewed the patients History and Physical, chart, labs and discussed the procedure including the risks, benefits and alternatives for the proposed anesthesia with the patient or authorized representative who has indicated his/her understanding and acceptance.   Dental advisory given  Plan Discussed with: CRNA and Anesthesiologist  Anesthesia Plan Comments:         Anesthesia Quick Evaluation

## 2017-06-05 ENCOUNTER — Encounter (HOSPITAL_COMMUNITY): Payer: Self-pay | Admitting: Vascular Surgery

## 2017-06-05 DIAGNOSIS — N184 Chronic kidney disease, stage 4 (severe): Secondary | ICD-10-CM | POA: Diagnosis not present

## 2017-06-05 DIAGNOSIS — Z992 Dependence on renal dialysis: Secondary | ICD-10-CM | POA: Diagnosis not present

## 2017-06-05 DIAGNOSIS — I5032 Chronic diastolic (congestive) heart failure: Secondary | ICD-10-CM | POA: Diagnosis not present

## 2017-06-05 DIAGNOSIS — I1310 Hypertensive heart and chronic kidney disease without heart failure, with stage 1 through stage 4 chronic kidney disease, or unspecified chronic kidney disease: Secondary | ICD-10-CM | POA: Diagnosis not present

## 2017-06-05 DIAGNOSIS — F329 Major depressive disorder, single episode, unspecified: Secondary | ICD-10-CM | POA: Diagnosis not present

## 2017-06-05 DIAGNOSIS — Z48812 Encounter for surgical aftercare following surgery on the circulatory system: Secondary | ICD-10-CM | POA: Diagnosis not present

## 2017-06-06 DIAGNOSIS — I1310 Hypertensive heart and chronic kidney disease without heart failure, with stage 1 through stage 4 chronic kidney disease, or unspecified chronic kidney disease: Secondary | ICD-10-CM | POA: Diagnosis not present

## 2017-06-06 DIAGNOSIS — N184 Chronic kidney disease, stage 4 (severe): Secondary | ICD-10-CM | POA: Diagnosis not present

## 2017-06-06 DIAGNOSIS — F329 Major depressive disorder, single episode, unspecified: Secondary | ICD-10-CM | POA: Diagnosis not present

## 2017-06-06 DIAGNOSIS — I5032 Chronic diastolic (congestive) heart failure: Secondary | ICD-10-CM | POA: Diagnosis not present

## 2017-06-06 DIAGNOSIS — Z992 Dependence on renal dialysis: Secondary | ICD-10-CM | POA: Diagnosis not present

## 2017-06-06 DIAGNOSIS — Z48812 Encounter for surgical aftercare following surgery on the circulatory system: Secondary | ICD-10-CM | POA: Diagnosis not present

## 2017-06-07 DIAGNOSIS — I1310 Hypertensive heart and chronic kidney disease without heart failure, with stage 1 through stage 4 chronic kidney disease, or unspecified chronic kidney disease: Secondary | ICD-10-CM | POA: Diagnosis not present

## 2017-06-07 DIAGNOSIS — I5032 Chronic diastolic (congestive) heart failure: Secondary | ICD-10-CM | POA: Diagnosis not present

## 2017-06-07 DIAGNOSIS — F329 Major depressive disorder, single episode, unspecified: Secondary | ICD-10-CM | POA: Diagnosis not present

## 2017-06-07 DIAGNOSIS — Z48812 Encounter for surgical aftercare following surgery on the circulatory system: Secondary | ICD-10-CM | POA: Diagnosis not present

## 2017-06-07 DIAGNOSIS — N184 Chronic kidney disease, stage 4 (severe): Secondary | ICD-10-CM | POA: Diagnosis not present

## 2017-06-07 DIAGNOSIS — Z992 Dependence on renal dialysis: Secondary | ICD-10-CM | POA: Diagnosis not present

## 2017-06-12 DIAGNOSIS — Z48812 Encounter for surgical aftercare following surgery on the circulatory system: Secondary | ICD-10-CM | POA: Diagnosis not present

## 2017-06-12 DIAGNOSIS — Z992 Dependence on renal dialysis: Secondary | ICD-10-CM | POA: Diagnosis not present

## 2017-06-12 DIAGNOSIS — I5032 Chronic diastolic (congestive) heart failure: Secondary | ICD-10-CM | POA: Diagnosis not present

## 2017-06-12 DIAGNOSIS — I1310 Hypertensive heart and chronic kidney disease without heart failure, with stage 1 through stage 4 chronic kidney disease, or unspecified chronic kidney disease: Secondary | ICD-10-CM | POA: Diagnosis not present

## 2017-06-12 DIAGNOSIS — F329 Major depressive disorder, single episode, unspecified: Secondary | ICD-10-CM | POA: Diagnosis not present

## 2017-06-12 DIAGNOSIS — N184 Chronic kidney disease, stage 4 (severe): Secondary | ICD-10-CM | POA: Diagnosis not present

## 2017-06-14 DIAGNOSIS — Z48812 Encounter for surgical aftercare following surgery on the circulatory system: Secondary | ICD-10-CM | POA: Diagnosis not present

## 2017-06-14 DIAGNOSIS — N184 Chronic kidney disease, stage 4 (severe): Secondary | ICD-10-CM | POA: Diagnosis not present

## 2017-06-14 DIAGNOSIS — Z992 Dependence on renal dialysis: Secondary | ICD-10-CM | POA: Diagnosis not present

## 2017-06-14 DIAGNOSIS — I5032 Chronic diastolic (congestive) heart failure: Secondary | ICD-10-CM | POA: Diagnosis not present

## 2017-06-14 DIAGNOSIS — I1310 Hypertensive heart and chronic kidney disease without heart failure, with stage 1 through stage 4 chronic kidney disease, or unspecified chronic kidney disease: Secondary | ICD-10-CM | POA: Diagnosis not present

## 2017-06-14 DIAGNOSIS — F329 Major depressive disorder, single episode, unspecified: Secondary | ICD-10-CM | POA: Diagnosis not present

## 2017-06-17 ENCOUNTER — Inpatient Hospital Stay: Payer: Medicare Other | Attending: Internal Medicine

## 2017-06-17 DIAGNOSIS — C9002 Multiple myeloma in relapse: Secondary | ICD-10-CM | POA: Insufficient documentation

## 2017-06-17 DIAGNOSIS — N189 Chronic kidney disease, unspecified: Secondary | ICD-10-CM | POA: Diagnosis not present

## 2017-06-17 DIAGNOSIS — C9000 Multiple myeloma not having achieved remission: Secondary | ICD-10-CM

## 2017-06-17 DIAGNOSIS — G629 Polyneuropathy, unspecified: Secondary | ICD-10-CM | POA: Diagnosis not present

## 2017-06-17 LAB — CMP (CANCER CENTER ONLY)
ALK PHOS: 100 U/L (ref 40–150)
ALT: 21 U/L (ref 0–55)
ANION GAP: 9 (ref 3–11)
AST: 18 U/L (ref 5–34)
Albumin: 3.6 g/dL (ref 3.5–5.0)
BILIRUBIN TOTAL: 0.8 mg/dL (ref 0.2–1.2)
BUN: 34 mg/dL — ABNORMAL HIGH (ref 7–26)
CALCIUM: 9 mg/dL (ref 8.4–10.4)
CO2: 24 mmol/L (ref 22–29)
Chloride: 105 mmol/L (ref 98–109)
Creatinine: 2.39 mg/dL — ABNORMAL HIGH (ref 0.60–1.10)
GFR, EST AFRICAN AMERICAN: 22 mL/min — AB (ref >60)
GFR, Estimated: 19 mL/min — ABNORMAL LOW (ref >60)
Glucose, Bld: 122 mg/dL (ref 70–140)
Potassium: 3.9 mmol/L (ref 3.5–5.1)
SODIUM: 138 mmol/L (ref 136–145)
TOTAL PROTEIN: 6.1 g/dL — AB (ref 6.4–8.3)

## 2017-06-17 LAB — LACTATE DEHYDROGENASE: LDH: 212 U/L (ref 125–245)

## 2017-06-17 LAB — CBC WITH DIFFERENTIAL (CANCER CENTER ONLY)
BASOS ABS: 0 10*3/uL (ref 0.0–0.1)
BASOS PCT: 1 %
Eosinophils Absolute: 0.1 10*3/uL (ref 0.0–0.5)
Eosinophils Relative: 2 %
HEMATOCRIT: 35.3 % (ref 34.8–46.6)
HEMOGLOBIN: 11.7 g/dL (ref 11.6–15.9)
Lymphocytes Relative: 16 %
Lymphs Abs: 0.7 10*3/uL — ABNORMAL LOW (ref 0.9–3.3)
MCH: 36.4 pg — ABNORMAL HIGH (ref 25.1–34.0)
MCHC: 33.2 g/dL (ref 31.5–36.0)
MCV: 109.9 fL — ABNORMAL HIGH (ref 79.5–101.0)
Monocytes Absolute: 0.4 10*3/uL (ref 0.1–0.9)
Monocytes Relative: 8 %
NEUTROS ABS: 3.4 10*3/uL (ref 1.5–6.5)
NEUTROS PCT: 73 %
Platelet Count: 85 10*3/uL — ABNORMAL LOW (ref 145–400)
RBC: 3.22 MIL/uL — ABNORMAL LOW (ref 3.70–5.45)
RDW: 15 % — ABNORMAL HIGH (ref 11.2–14.5)
WBC Count: 4.5 10*3/uL (ref 3.9–10.3)

## 2017-06-18 DIAGNOSIS — Z992 Dependence on renal dialysis: Secondary | ICD-10-CM | POA: Diagnosis not present

## 2017-06-18 DIAGNOSIS — Z48812 Encounter for surgical aftercare following surgery on the circulatory system: Secondary | ICD-10-CM | POA: Diagnosis not present

## 2017-06-18 DIAGNOSIS — F329 Major depressive disorder, single episode, unspecified: Secondary | ICD-10-CM | POA: Diagnosis not present

## 2017-06-18 DIAGNOSIS — I5032 Chronic diastolic (congestive) heart failure: Secondary | ICD-10-CM | POA: Diagnosis not present

## 2017-06-18 DIAGNOSIS — N184 Chronic kidney disease, stage 4 (severe): Secondary | ICD-10-CM | POA: Diagnosis not present

## 2017-06-18 DIAGNOSIS — I1310 Hypertensive heart and chronic kidney disease without heart failure, with stage 1 through stage 4 chronic kidney disease, or unspecified chronic kidney disease: Secondary | ICD-10-CM | POA: Diagnosis not present

## 2017-06-18 LAB — IGG, IGA, IGM
IGG (IMMUNOGLOBIN G), SERUM: 353 mg/dL — AB (ref 700–1600)
IgA: 12 mg/dL — ABNORMAL LOW (ref 64–422)
IgM (Immunoglobulin M), Srm: 14 mg/dL — ABNORMAL LOW (ref 26–217)

## 2017-06-18 LAB — BETA 2 MICROGLOBULIN, SERUM: BETA 2 MICROGLOBULIN: 6 mg/L — AB (ref 0.6–2.4)

## 2017-06-18 LAB — KAPPA/LAMBDA LIGHT CHAINS
Kappa free light chain: 21.5 mg/L — ABNORMAL HIGH (ref 3.3–19.4)
Kappa, lambda light chain ratio: 3.52 — ABNORMAL HIGH (ref 0.26–1.65)
Lambda free light chains: 6.1 mg/L (ref 5.7–26.3)

## 2017-06-21 DIAGNOSIS — Z992 Dependence on renal dialysis: Secondary | ICD-10-CM | POA: Diagnosis not present

## 2017-06-21 DIAGNOSIS — N184 Chronic kidney disease, stage 4 (severe): Secondary | ICD-10-CM | POA: Diagnosis not present

## 2017-06-21 DIAGNOSIS — I5032 Chronic diastolic (congestive) heart failure: Secondary | ICD-10-CM | POA: Diagnosis not present

## 2017-06-21 DIAGNOSIS — Z48812 Encounter for surgical aftercare following surgery on the circulatory system: Secondary | ICD-10-CM | POA: Diagnosis not present

## 2017-06-21 DIAGNOSIS — I1310 Hypertensive heart and chronic kidney disease without heart failure, with stage 1 through stage 4 chronic kidney disease, or unspecified chronic kidney disease: Secondary | ICD-10-CM | POA: Diagnosis not present

## 2017-06-21 DIAGNOSIS — F329 Major depressive disorder, single episode, unspecified: Secondary | ICD-10-CM | POA: Diagnosis not present

## 2017-06-24 ENCOUNTER — Encounter: Payer: Self-pay | Admitting: Internal Medicine

## 2017-06-24 ENCOUNTER — Telehealth: Payer: Self-pay | Admitting: Internal Medicine

## 2017-06-24 ENCOUNTER — Inpatient Hospital Stay (HOSPITAL_BASED_OUTPATIENT_CLINIC_OR_DEPARTMENT_OTHER): Payer: Medicare Other | Admitting: Internal Medicine

## 2017-06-24 ENCOUNTER — Other Ambulatory Visit: Payer: Medicare Other

## 2017-06-24 VITALS — BP 150/83 | HR 75 | Temp 98.6°F | Resp 17 | Ht 65.0 in | Wt 178.1 lb

## 2017-06-24 DIAGNOSIS — N189 Chronic kidney disease, unspecified: Secondary | ICD-10-CM

## 2017-06-24 DIAGNOSIS — I10 Essential (primary) hypertension: Secondary | ICD-10-CM

## 2017-06-24 DIAGNOSIS — G629 Polyneuropathy, unspecified: Secondary | ICD-10-CM

## 2017-06-24 DIAGNOSIS — C9002 Multiple myeloma in relapse: Secondary | ICD-10-CM

## 2017-06-24 DIAGNOSIS — C9000 Multiple myeloma not having achieved remission: Secondary | ICD-10-CM

## 2017-06-24 NOTE — Progress Notes (Signed)
[facility] [phone]   Fax:([phone]  OFFICE PROGRESS NOTE  [name], [address] [place] [address]  DIAGNOSIS:  1. 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 Recurrent multiple myeloma initially diagnosed as a smoldering myeloma at [facility] in 2002. 3. Ductal carcinoma in situ status post mastectomy with sentinel lymph node biopsy in October 2008.  PRIOR THERAPY: 1. Status post treatment with tamoxifen from November 2008 through February 2009, discontinued secondary to intolerance. 2. Status post 3 cycles of chemotherapy with Revlimid and Decadron followed by 1 cycle of Decadron only with mild response. 3. Status post 2 cycles of systemic chemotherapy with Velcade, Doxil and Decadron discontinued secondary to significant peripheral neuropathy. Last dose was given May 2010 at [facility]. 4. Status post autologous peripheral blood stem cell transplant on October 01, 2008 at [facility] under the care of Dr. Phyllis Ginger.  5. Systemic chemotherapy with Carfilzomib initially was 20 mg/M2 and will be increased after cycle #1 to 36 mg/M2 on days 1, 2, 8, 9, 15 and 16 every 4 weeks in addition to Cytoxan 300 mg/M2 and Decadron 40 mg by mouth weekly basis, status post 4 cycles. First cycle on 12/29/2012. 6. She resumed chemotherapy with Carfilzomib, Cytoxan, and Decadron on 05/03/2014. Status post 6 cycles. 7. systemic chemotherapy again with Carfilzomib, Cytoxan and Decadron on 07/11/2015. Status post 3 cycles. 8. She completed a course of treatment with Diflucan 200 mg by mouth daily for around 6 weeks for cryptococcal infection of the lung.  CURRENT THERAPY: Resuming treatment again with Carfilzomib, Cytoxan and Decadron, first cycle 04/23/2016. Status post 7 cycles.  Treatment is currently on hold.  INTERVAL HISTORY: RAEVIN WIERENGA 77 y.o. female returns to the clinic  today for 54-monthfollow-up visit.  The patient is feeling fine today with no specific complaints.  She denied having any chest pain, shortness of breath, cough or hemoptysis.  She denied having any recent weight loss or night sweats.  She has no nausea, vomiting, diarrhea or constipation.  She made the medical decision regarding her dialysis and she declined dialysis in the future and reversed the AV fistula.  She has no other complaints today.  She had repeat myeloma panel and she is here for evaluation and discussion of her lab results.  MEDICAL HISTORY: Past Medical History:  Diagnosis Date  . Anemia   . Anginal pain ([place] [place]    used NTG x 2 May 31 and 06/15/13   . Anxiety   . Arthritis   . B12 deficiency 12/04/2014  . Breast cancer ([facility]   . CKD (chronic kidney disease) stage 3, GFR 30-59 ml/min (HCC)   . Complication of anesthesia   . COPD (chronic obstructive pulmonary disease) ([facility]   . Cryptococcal pneumonitis ([place] 11/22/2015  . Depression   . Dizziness   . Dyspnea   . Fibromyalgia   . Fibromyalgia   . GERD (gastroesophageal reflux disease)   . Headache(784.0)   . Heart murmur   . Hemoptysis 10/21/2015  . History of blood transfusion    last one May 12   . Hx of cardiovascular stress test    LexiScan with low level exercise Myoview (02/2013): No ischemia, EF 72%; normal study  . Hx of echocardiogram    a.  Echocardiogram (12/26/2012): EF 642-70% grade 1 diastolic dysfunction;   b.  Echocardiogram (02/2013): EF 55-60%, no WMA, trivial effusion  .  Hyperkalemia   . Hyponatremia   . Hypotension   . Hypothyroidism   . Mucositis   . Multiple myeloma   . Myocardial infarction [facility])    in past, patient was unaware.   . Neuropathy   . Pain in joint, pelvic region and thigh 07/07/2015  . Pneumonia    several  . PONV (postoperative nausea and vomiting) 2008   after mastestomy    ALLERGIES:  is allergic to codeine; latex; chocolate; onion; zyprexa [olanzapine]; adhesive [tape];  heparin; hydrocodone; iodinated diagnostic agents; promethazine-dm; and sulfa antibiotics.  MEDICATIONS:  Current Outpatient Medications  Medication Sig Dispense Refill  . acetaminophen (TYLENOL) 500 MG tablet Take 1,000 mg by mouth daily as needed for moderate pain or headache.    . albuterol (PROVENTIL HFA;VENTOLIN HFA) 108 (90 Base) MCG/ACT inhaler Inhale 1-2 puffs into the lungs every 6 (six) hours as needed for wheezing or shortness of breath (cough). 1 Inhaler 3  . azelastine (ASTELIN) 0.1 % nasal spray Place 1 spray into both nostrils daily. Use in each nostril as directed    . benzonatate (TESSALON PERLES) 100 MG capsule Take 2 capsules (200 mg total) by mouth 3 (three) times daily as needed for cough (Max: 635m per day). 60 capsule 0  . buPROPion (WELLBUTRIN) 75 MG tablet Take 1 tablet (75 mg total) by mouth 2 (two) times daily. 60 tablet 2  . butalbital-acetaminophen-caffeine (FIORICET, ESGIC) 50-325-40 MG tablet Take 1 tablet by mouth every 4 (four) hours as needed for headache or migraine.   3  . Cholecalciferol 4000 UNITS TABS Take 4,000 Units by mouth daily.    . citalopram (CELEXA) 40 MG tablet TAKE 1 TABLET BY MOUTH DAILY OR AS DIRECTED (Patient taking differently: Take 40 mg by mouth daily) 90 tablet 0  . cromolyn (OPTICROM) 4 % ophthalmic solution Place 1 drop into both eyes 4 (four) times daily as needed (irritation).     .Marland Kitchendexamethasone (DECADRON) 4 MG tablet Take 1 tablet (4 mg total) by mouth daily. Take 10 tablets by mouth once a week as directed. (Patient not taking: Reported on 05/31/2017) 30 tablet 1  . folic acid (FOLVITE) 1 MG tablet Take 1 tablet (1 mg total) by mouth daily. 30 tablet 3  . furosemide (LASIX) 40 MG tablet TAKE 1 TABLET BY MOUTH DAILY. (Patient taking differently: Take 40 mg by mouth as needed for fluid) 90 tablet 1  . gabapentin (NEURONTIN) 300 MG capsule TAKE 1 CAPSULE BY MOUTH AT BEDTIME. 90 capsule 3  . levothyroxine (SYNTHROID, LEVOTHROID) 150 MCG  tablet TAKE 1 TABLET BY MOUTH DAILY. NEED OFFICE VISIT FOR FURTHER REFILLS. 90 tablet 0  . loperamide (IMODIUM) 2 MG capsule Take 1 capsule (2 mg total) by mouth as needed for diarrhea or loose stools. 30 capsule 2  . loratadine (CLARITIN) 10 MG tablet Take 10 mg by mouth daily.    . magic mouthwash SOLN Take 5 mLs by mouth 4 (four) times daily as needed for mouth pain. 240 mL 1  . midodrine (PROAMATINE) 10 MG tablet Take 10 mg by mouth daily.    . montelukast (SINGULAIR) 10 MG tablet TAKE 1 TABLET BY MOUTH DAILY. 90 tablet 1  . Multiple Vitamins-Minerals (PRESERVISION AREDS 2) CAPS Take 1 capsule by mouth daily.    . nitroGLYCERIN (NITROSTAT) 0.4 MG SL tablet Place 0.4 mg under the tongue every 5 (five) minutes as needed for chest pain.     .Marland Kitchennystatin (MYCOSTATIN/NYSTOP) powder Apply topically 2 (two) times daily. (  Patient taking differently: Apply 1 g topically daily. ) 30 g 0  . oxyCODONE (ROXICODONE) 5 MG immediate release tablet Take 1 tablet (5 mg total) by mouth every 4 (four) hours as needed for severe pain. 15 tablet 0  . pilocarpine (SALAGEN) 5 MG tablet TAKE 1 TABLET BY MOUTH 3 TIMES DAILY FOR DRY MOUTH. (Patient taking differently: TAKE 1 TABLET BY MOUTH 2 TIMES DAILY FOR DRY MOUTH.) 270 tablet 1  . predniSONE (DELTASONE) 20 MG tablet 2 tablets daily for 3 days, 1 tablet daily for 4 days. 10 tablet 0  . ranitidine (ZANTAC) 300 MG tablet TAKE 1 TO 2 TABLETS DAILY FOR HEARTBURN & REFLUX (Patient taking differently: Take 300 mg by mouth twice daily) 180 tablet 0  . senna (SENOKOT) 8.6 MG tablet Take 1 tablet by mouth daily as needed for constipation.     . traMADol (ULTRAM) 50 MG tablet Take 1 to 2 tablets by mouth 4 times a day as needed for pain. (Patient taking differently: Take 50 mg by mouth 2 (two) times daily as needed for moderate pain or severe pain. ) 180 tablet 0  . umeclidinium-vilanterol (ANORO ELLIPTA) 62.5-25 MCG/INH AEPB Inhale 1 puff into the lungs daily as needed (shortness  of breath).    . vitamin B-12 (CYANOCOBALAMIN) 1000 MCG tablet Take 1 tablet (1,000 mcg total) by mouth daily. (Patient taking differently: Take 1,000 mcg by mouth 2 (two) times daily. ) 30 tablet 0  . vitamin C (ASCORBIC ACID) 500 MG tablet Take 500 mg by mouth daily.     Current Facility-Administered Medications  Medication Dose Route Frequency Provider Last Rate Last Dose  . ipratropium-albuterol (DUONEB) 0.5-2.5 (3) MG/3ML nebulizer solution 3 mL  3 mL Nebulization Once Liane Comber, NP        SURGICAL HISTORY:  Past Surgical History:  Procedure Laterality Date  . ABDOMINAL HYSTERECTOMY  1981  . AV FISTULA PLACEMENT Left 06/19/2013   Procedure: CREATION OF LEFT ARM ARTERIOVENOUS (AV) FISTULA ;  Surgeon: Angelia Mould, MD;  Location: [facility];  Service: Vascular;  Laterality: Left;  . BREAST RECONSTRUCTION    . BREAST SURGERY Right    reduction  . CATARACT EXTRACTION, BILATERAL    . CHOLECYSTECTOMY  1971  . COLONOSCOPY    . EYE SURGERY Bilateral    lens implant  . history of Port removal    . LUNG BIOPSY  10/21/2015  . MASTECTOMY Left 2008  . PORTACATH PLACEMENT  12/2012   has had 2  . RESECTION OF ARTERIOVENOUS FISTULA ANEURYSM Left 06/04/2017   Procedure: LIGATION ANEURYSM OF LEFT ARTERIOVENOUS FISTULA;  Surgeon: Angelia Mould, MD;  Location: [facility];  Service: Vascular;  Laterality: Left;  . Status post stem cell transplant on September 28, 2008.      REVIEW OF SYSTEMS:  A comprehensive review of systems was negative except for: Constitutional: positive for fatigue   PHYSICAL EXAMINATION: General appearance: alert, cooperative, fatigued and no distress Head: Normocephalic, without obvious abnormality, atraumatic Neck: no adenopathy, no JVD, supple, symmetrical, trachea midline and thyroid not enlarged, symmetric, no tenderness/mass/nodules Lymph nodes: Cervical, supraclavicular, and axillary nodes normal. Resp: clear to auscultation bilaterally Back:  symmetric, no curvature. ROM normal. No CVA tenderness. Cardio: regular rate and rhythm, S1, S2 normal, no murmur, click, rub or gallop GI: soft, non-tender; bowel sounds normal; no masses,  no organomegaly Extremities: extremities normal, atraumatic, no cyanosis or edema  ECOG PERFORMANCE STATUS: 1 - Symptomatic but completely ambulatory  Blood pressure Marland Kitchen)  150/83, pulse 75, temperature 98.6 F (37 C), temperature source Oral, resp. rate 17, height 5' 5" (1.651 m), weight 178 lb 1.6 oz (80.8 kg), SpO2 96 %.  LABORATORY DATA: Lab Results  Component Value Date   WBC 4.5 06/17/2017   HGB 11.7 06/17/2017   HCT 35.3 06/17/2017   MCV 109.9 (H) 06/17/2017   PLT 85 (L) 06/17/2017      Chemistry      Component Value Date/Time   NA 138 06/17/2017 1134   NA 144 01/17/2017 1146   K 3.9 06/17/2017 1134   K 3.7 01/17/2017 1146   CL 105 06/17/2017 1134   CL 105 03/19/2012 0811   CO2 24 06/17/2017 1134   CO2 25 01/17/2017 1146   BUN 34 (H) 06/17/2017 1134   BUN 35.0 (H) 01/17/2017 1146   CREATININE 2.39 (H) 06/17/2017 1134   CREATININE 2.42 (H) 05/29/2017 1011   CREATININE 2.8 (H) 01/17/2017 1146      Component Value Date/Time   CALCIUM 9.0 06/17/2017 1134   CALCIUM 9.1 01/17/2017 1146   ALKPHOS 100 06/17/2017 1134   ALKPHOS 110 01/17/2017 1146   AST 18 06/17/2017 1134   AST 20 01/17/2017 1146   ALT 21 06/17/2017 1134   ALT 22 01/17/2017 1146   BILITOT 0.8 06/17/2017 1134   BILITOT 0.73 01/17/2017 1146       RADIOGRAPHIC STUDIES: No results found.  ASSESSMENT AND PLAN:  This is a very pleasant 77 years old white female with relapsed multiple myeloma status post several chemotherapy regimens and she is currently on treatment with systemic chemotherapy with Carfilzomib, Cytoxan and Decadron status post 7 cycles. She has been tolerating this treatment well except for fatigue. She is currently on observation and feeling well. Her myeloma panel showed no concerning findings for  disease progression. I discussed the lab results with the patient and recommended for her to continue on observation with repeat myeloma panel in 3 months. For the chronic renal insufficiency, she is followed by nephrology. For the peripheral neuropathy, she will continue her current treatment with Lyrica. She was advised to call immediately if she has any concerning symptoms in the interval. The patient voices understanding of current disease status and treatment options and is in agreement with the current care plan. All questions were answered. The patient knows to call the clinic with any problems, questions or concerns. We can certainly see the patient much sooner if necessary.  Disclaimer: This note was dictated with voice recognition software. Similar sounding words can inadvertently be transcribed and may not be corrected upon review.

## 2017-06-24 NOTE — Telephone Encounter (Signed)
Appointments scheduled AVS/Cancelled per 6/10 los

## 2017-06-25 ENCOUNTER — Encounter: Payer: Self-pay | Admitting: Internal Medicine

## 2017-06-25 DIAGNOSIS — Z48812 Encounter for surgical aftercare following surgery on the circulatory system: Secondary | ICD-10-CM | POA: Diagnosis not present

## 2017-06-25 DIAGNOSIS — I1310 Hypertensive heart and chronic kidney disease without heart failure, with stage 1 through stage 4 chronic kidney disease, or unspecified chronic kidney disease: Secondary | ICD-10-CM | POA: Diagnosis not present

## 2017-06-25 DIAGNOSIS — F329 Major depressive disorder, single episode, unspecified: Secondary | ICD-10-CM | POA: Diagnosis not present

## 2017-06-25 DIAGNOSIS — N184 Chronic kidney disease, stage 4 (severe): Secondary | ICD-10-CM | POA: Diagnosis not present

## 2017-06-25 DIAGNOSIS — I5032 Chronic diastolic (congestive) heart failure: Secondary | ICD-10-CM | POA: Diagnosis not present

## 2017-06-25 DIAGNOSIS — Z992 Dependence on renal dialysis: Secondary | ICD-10-CM | POA: Diagnosis not present

## 2017-06-28 DIAGNOSIS — F329 Major depressive disorder, single episode, unspecified: Secondary | ICD-10-CM | POA: Diagnosis not present

## 2017-06-28 DIAGNOSIS — I5032 Chronic diastolic (congestive) heart failure: Secondary | ICD-10-CM | POA: Diagnosis not present

## 2017-06-28 DIAGNOSIS — N184 Chronic kidney disease, stage 4 (severe): Secondary | ICD-10-CM | POA: Diagnosis not present

## 2017-06-28 DIAGNOSIS — Z992 Dependence on renal dialysis: Secondary | ICD-10-CM | POA: Diagnosis not present

## 2017-06-28 DIAGNOSIS — I1310 Hypertensive heart and chronic kidney disease without heart failure, with stage 1 through stage 4 chronic kidney disease, or unspecified chronic kidney disease: Secondary | ICD-10-CM | POA: Diagnosis not present

## 2017-06-28 DIAGNOSIS — Z48812 Encounter for surgical aftercare following surgery on the circulatory system: Secondary | ICD-10-CM | POA: Diagnosis not present

## 2017-07-02 DIAGNOSIS — I5032 Chronic diastolic (congestive) heart failure: Secondary | ICD-10-CM | POA: Diagnosis not present

## 2017-07-02 DIAGNOSIS — N184 Chronic kidney disease, stage 4 (severe): Secondary | ICD-10-CM | POA: Diagnosis not present

## 2017-07-02 DIAGNOSIS — F329 Major depressive disorder, single episode, unspecified: Secondary | ICD-10-CM | POA: Diagnosis not present

## 2017-07-02 DIAGNOSIS — Z992 Dependence on renal dialysis: Secondary | ICD-10-CM | POA: Diagnosis not present

## 2017-07-02 DIAGNOSIS — I1310 Hypertensive heart and chronic kidney disease without heart failure, with stage 1 through stage 4 chronic kidney disease, or unspecified chronic kidney disease: Secondary | ICD-10-CM | POA: Diagnosis not present

## 2017-07-02 DIAGNOSIS — Z48812 Encounter for surgical aftercare following surgery on the circulatory system: Secondary | ICD-10-CM | POA: Diagnosis not present

## 2017-07-05 DIAGNOSIS — I1310 Hypertensive heart and chronic kidney disease without heart failure, with stage 1 through stage 4 chronic kidney disease, or unspecified chronic kidney disease: Secondary | ICD-10-CM | POA: Diagnosis not present

## 2017-07-05 DIAGNOSIS — I5032 Chronic diastolic (congestive) heart failure: Secondary | ICD-10-CM | POA: Diagnosis not present

## 2017-07-05 DIAGNOSIS — Z992 Dependence on renal dialysis: Secondary | ICD-10-CM | POA: Diagnosis not present

## 2017-07-05 DIAGNOSIS — Z48812 Encounter for surgical aftercare following surgery on the circulatory system: Secondary | ICD-10-CM | POA: Diagnosis not present

## 2017-07-05 DIAGNOSIS — N184 Chronic kidney disease, stage 4 (severe): Secondary | ICD-10-CM | POA: Diagnosis not present

## 2017-07-05 DIAGNOSIS — F329 Major depressive disorder, single episode, unspecified: Secondary | ICD-10-CM | POA: Diagnosis not present

## 2017-07-08 DIAGNOSIS — I5032 Chronic diastolic (congestive) heart failure: Secondary | ICD-10-CM | POA: Diagnosis not present

## 2017-07-08 DIAGNOSIS — Z48812 Encounter for surgical aftercare following surgery on the circulatory system: Secondary | ICD-10-CM | POA: Diagnosis not present

## 2017-07-08 DIAGNOSIS — I1310 Hypertensive heart and chronic kidney disease without heart failure, with stage 1 through stage 4 chronic kidney disease, or unspecified chronic kidney disease: Secondary | ICD-10-CM | POA: Diagnosis not present

## 2017-07-08 DIAGNOSIS — N184 Chronic kidney disease, stage 4 (severe): Secondary | ICD-10-CM | POA: Diagnosis not present

## 2017-07-08 DIAGNOSIS — F329 Major depressive disorder, single episode, unspecified: Secondary | ICD-10-CM | POA: Diagnosis not present

## 2017-07-08 DIAGNOSIS — Z992 Dependence on renal dialysis: Secondary | ICD-10-CM | POA: Diagnosis not present

## 2017-07-10 ENCOUNTER — Other Ambulatory Visit: Payer: Self-pay | Admitting: Internal Medicine

## 2017-07-10 ENCOUNTER — Other Ambulatory Visit: Payer: Self-pay | Admitting: Physician Assistant

## 2017-07-11 NOTE — Progress Notes (Signed)
Assessment and Plan:  Karron was seen today for acute visit and headache.  Diagnoses and all orders for this visit:  Frontal headache ? Sinus headache secondary to allergies, + sinus pressure/tenderness, hx of recurrent R maxillary sinusitis, will treat for sinus infection over the weekend, neuro exam today unremarkable other than reports of blurry vision in right eye (admits to vigorus rubbing due to itching) and increased clumsiness/dropping items at home and misjudging distances  Will consider head CT if not improving/worsening after the weekend ER precautions given over the weekend -     dexamethasone (DECADRON) 0.5 MG tablet; Take 1 tablet PO BID for 5 days and then 1 tablet PO QDaily for 5 days. -     azithromycin (ZITHROMAX) 250 MG tablet; Take 2 tablets (500 mg) on  Day 1,  followed by 1 tablet (250 mg) once daily on Days 2 through 5.  Blurry vision, right eye Patient to call to schedule appointment with established ophthalmology early next week; if symptoms resolve over the weekend may cancel  Further disposition pending results of labs. Discussed med's effects and SE's.   Over 30 minutes of exam, counseling, chart review, and critical decision making was performed.   Future Appointments  Date Time Provider [place]  07/17/2017  3:00 PM Angelia Mould, MD [facility]  09/13/2017  9:45 AM [name], MD [facility] None  09/17/2017  8:00 AM [facility] None  09/24/2017  8:45 AM Curt Bears, MD [facility] None    ------------------------------------------------------------------------------------------------------------------   HPI BP 136/80   Pulse 85   Temp 97.8 F (36.6 C)   Resp 16   Ht _0  (1.651 m)   Wt 182 lb 9.6 oz (82.8 kg)   SpO2 99%   BMI 30.39 kg/m   77 y.o.female with hx of multple myeloma not in remission (followed by Dr. Earlie Server),  She also has CKD IV/V - previous had fistula for impending need for dialysis  but patient has opted to reverse this last week and reports she does not want to start dialysis and wants to "go naturally." She is quite upset today regarding as she wanted fistula fully reversed but awoke to find only partial reversal.   She presents c/o facial pressure (right maxilary/frontal pressure), mild generalized frontal headache progressively worse through the day into the evenings that breaks through tylenol in the evenings. She reports symptoms began 6 days ago and feels are slowly worsening. She also endorses has had upper teeth aching intermittently.   She reports blurry vision in right eye, increased problems with coordination, has been dropping dishes, over/under reaching. She is photosensitive today. She admits to severe allergies this year, with bilateral eye itchiness and has been rubbing a lot, found herself rubbing eye until area was    She reports she has had severe allergies this year and is taking claritin and sudafed  She has neuropathy of feet at baseline secondary to chemotherapy and has wide gait  Ct head from 12/22/2015 reviewed; showed mild chronic small vessel changes and acute on chronic right maxillary sinusitis  Past Medical History:  Diagnosis Date  . Anemia   . Anginal pain ([place])    used NTG x 2 May 31 and 06/15/13   . Anxiety   . Arthritis   . B12 deficiency 12/04/2014  . Breast cancer ([place])   . CKD (chronic kidney disease) stage 3, GFR 30-59 ml/min (HCC)   . Complication of anesthesia   . COPD (chronic obstructive  pulmonary disease) ([place])   . 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 Depression   . Dizziness   . Dyspnea   . Fibromyalgia   . Fibromyalgia   . GERD (gastroesophageal reflux disease)   . Headache(784.0)   . Heart murmur   . Hemoptysis 10/21/2015  . History of blood transfusion    last one May 12   . Hx of cardiovascular stress test    LexiScan with low level exercise Myoview (02/2013): No ischemia, EF 72%; normal study  . Hx of  echocardiogram    a.  Echocardiogram (12/26/2012): EF 36-14%, grade 1 diastolic dysfunction;   b.  Echocardiogram (02/2013): EF 55-60%, no WMA, trivial effusion  . Hyperkalemia   . Hyponatremia   . Hypotension   . Hypothyroidism   . Mucositis   . Multiple myeloma   . Myocardial infarction [facility])    in past, patient was unaware.   . Neuropathy   . Pain in joint, pelvic region and thigh 07/07/2015  . Pneumonia    several  . PONV (postoperative nausea and vomiting) 2008   after mastestomy     Allergies  Allergen Reactions  . Codeine Anaphylaxis  . Latex Shortness Of Breath  . Chocolate     migraines  . Onion Other (See Comments)    Causes migraine headaches  . Zyprexa [Olanzapine] Other (See Comments)    Confusion , dizzy,unsteady  . Adhesive [Tape] Other (See Comments)    blisters  . Heparin Rash  . Hydrocodone Rash    With extreme itching  . Iodinated Diagnostic Agents Hives, Itching, Rash and Other (See Comments)    Happened 60 years ago Stage IV kidney function  . Promethazine-Dm Hives  . Sulfa Antibiotics Itching    Current Outpatient Medications on File Prior to Visit  Medication Sig  . acetaminophen (TYLENOL) 500 MG tablet Take 1,000 mg by mouth daily as needed for moderate pain or headache.  . albuterol (PROVENTIL HFA;VENTOLIN HFA) 108 (90 Base) MCG/ACT inhaler Inhale 1-2 puffs into the lungs every 6 (six) hours as needed for wheezing or shortness of breath (cough).  Marland Kitchen azelastine (ASTELIN) 0.1 % nasal spray Place 1 spray into both nostrils daily. Use in each nostril as directed  . buPROPion (WELLBUTRIN) 75 MG tablet Take 1 tablet (75 mg total) by mouth 2 (two) times daily.  . butalbital-acetaminophen-caffeine (FIORICET, ESGIC) 50-325-40 MG tablet Take 1 tablet by mouth every 4 (four) hours as needed for headache or migraine.   . Cholecalciferol 4000 UNITS TABS Take 4,000 Units by mouth daily.  . citalopram (CELEXA) 40 MG tablet TAKE 1 TABLET BY MOUTH DAILY OR AS  DIRECTED  . cromolyn (OPTICROM) 4 % ophthalmic solution Place 1 drop into both eyes 4 (four) times daily as needed (irritation).   . folic acid (FOLVITE) 1 MG tablet Take 1 tablet (1 mg total) by mouth daily.  . furosemide (LASIX) 40 MG tablet TAKE 1 TABLET BY MOUTH DAILY. (Patient taking differently: Take 40 mg by mouth as needed for fluid)  . gabapentin (NEURONTIN) 300 MG capsule TAKE 1 CAPSULE BY MOUTH AT BEDTIME.  Marland Kitchen levothyroxine (SYNTHROID, LEVOTHROID) 150 MCG tablet TAKE 1 TABLET BY MOUTH DAILY. NEED OFFICE VISIT FOR FURTHER REFILLS.  Marland Kitchen loperamide (IMODIUM) 2 MG capsule Take 1 capsule (2 mg total) by mouth as needed for diarrhea or loose stools.  Marland Kitchen loratadine (CLARITIN) 10 MG tablet Take 10 mg by mouth daily.  . magic mouthwash SOLN Take 5 mLs by mouth 4 (four)  times daily as needed for mouth pain.  . midodrine (PROAMATINE) 10 MG tablet Take 10 mg by mouth daily.  . montelukast (SINGULAIR) 10 MG tablet TAKE 1 TABLET BY MOUTH DAILY.  . Multiple Vitamins-Minerals (PRESERVISION AREDS 2) CAPS Take 1 capsule by mouth daily.  . nitroGLYCERIN (NITROSTAT) 0.4 MG SL tablet Place 0.4 mg under the tongue every 5 (five) minutes as needed for chest pain.   Marland Kitchen nystatin (MYCOSTATIN/NYSTOP) powder Apply topically 2 (two) times daily. (Patient taking differently: Apply 1 g topically daily. )  . oxyCODONE (ROXICODONE) 5 MG immediate release tablet Take 1 tablet (5 mg total) by mouth every 4 (four) hours as needed for severe pain.  . pilocarpine (SALAGEN) 5 MG tablet TAKE 1 TABLET BY MOUTH 3 TIMES DAILY FOR DRY MOUTH. (Patient taking differently: TAKE 1 TABLET BY MOUTH 2 TIMES DAILY FOR DRY MOUTH.)  . ranitidine (ZANTAC) 300 MG tablet TAKE 1 TO 2 TABLETS DAILY FOR HEARTBURN & REFLUX (Patient taking differently: Take 300 mg by mouth twice daily)  . senna (SENOKOT) 8.6 MG tablet Take 1 tablet by mouth daily as needed for constipation.   . traMADol (ULTRAM) 50 MG tablet Take 1 to 2 tablets by mouth 4 times a day as  needed for pain. (Patient taking differently: Take 50 mg by mouth 2 (two) times daily as needed for moderate pain or severe pain. )  . umeclidinium-vilanterol (ANORO ELLIPTA) 62.5-25 MCG/INH AEPB Inhale 1 puff into the lungs daily as needed (shortness of breath).  . vitamin B-12 (CYANOCOBALAMIN) 1000 MCG tablet Take 1 tablet (1,000 mcg total) by mouth daily. (Patient taking differently: Take 1,000 mcg by mouth 2 (two) times daily. )  . vitamin C (ASCORBIC ACID) 500 MG tablet Take 500 mg by mouth daily.   Current Facility-Administered Medications on File Prior to Visit  Medication  . ipratropium-albuterol (DUONEB) 0.5-2.5 (3) MG/3ML nebulizer solution 3 mL    ROS: Review of Systems  Constitutional: Negative for chills, diaphoresis, fever and malaise/fatigue.  HENT: Positive for congestion. Negative for ear discharge, ear pain, hearing loss, sinus pain, sore throat and tinnitus.   Eyes: Positive for blurred vision (R eye). Negative for pain, discharge and redness.  Respiratory: Negative for cough, hemoptysis, sputum production, shortness of breath, wheezing and stridor.   Cardiovascular: Negative for chest pain, palpitations and orthopnea.  Gastrointestinal: Negative for abdominal pain, diarrhea, nausea and vomiting.  Genitourinary: Negative.   Musculoskeletal: Negative for joint pain and myalgias.  Skin: Negative for rash.  Neurological: Positive for headaches (generalized/frontal, worse in afternoons). Negative for dizziness, tingling, tremors, sensory change, speech change, focal weakness, seizures, loss of consciousness and weakness.       Increased clumsiness, misjudging distances and dropping things  Endo/Heme/Allergies: Negative for environmental allergies.  Psychiatric/Behavioral: Negative.   All other systems reviewed and are negative.   Physical Exam:  BP 136/80   Pulse 85   Temp 97.8 F (36.6 C)   Resp 16   Ht _0  (1.651 m)   Wt 182 lb 9.6 oz (82.8 kg)   SpO2 99%   BMI  30.39 kg/m   General Appearance: Well nourished, in no apparent distress. Eyes: PERRLA, EOMs, conjunctiva no swelling or erythema, right globe not firm/excessively tender to palpation, no exophthalmus, no obvious hemorrhage to eye, no retinal hemmorhage; patient photosensitive and unable to visualize optic disc Sinuses: Right sided Frontal/maxillary tenderness without visible swelling/erythema ENT/Mouth: Ext aud canals clear, TMs without erythema, bulging. No erythema, swelling, or exudate on post pharynx.  Tonsils not swollen or erythematous. Hearing normal.  Neck: Supple, thyroid normal.  Respiratory: Respiratory effort normal, BS equal bilaterally without rales, rhonchi, wheezing or stridor.  Cardio: RRR with no MRGs. Brisk peripheral pulses without edema.  Abdomen: Soft, + BS.  Non tender, no guarding, rebound, hernias, masses. Lymphatics: Non tender without lymphadenopathy.  Musculoskeletal: Full ROM, 5/5 strength, slow gait (baseline due to neuropathy)  Skin: Warm, dry without rashes, lesions, ecchymosis.  Neuro: Cranial nerves intact. Normal muscle tone, no cerebellar symptoms. Sensation intact (excepting neuropathy of bilateral feet consistent with baseline). Rapid alternating movements, heel to shin normal, finger-nose-finger is slow.  Psych: Awake and oriented X 3, normal affect, Insight and Judgment appropriate.     Izora Ribas, NP 12:28 PM [facility]

## 2017-07-12 ENCOUNTER — Encounter: Payer: Self-pay | Admitting: Adult Health

## 2017-07-12 ENCOUNTER — Ambulatory Visit (INDEPENDENT_AMBULATORY_CARE_PROVIDER_SITE_OTHER): Payer: Medicare Other | Admitting: Adult Health

## 2017-07-12 VITALS — BP 136/80 | HR 85 | Temp 97.8°F | Resp 16 | Ht 65.0 in | Wt 182.6 lb

## 2017-07-12 DIAGNOSIS — R519 Headache, unspecified: Secondary | ICD-10-CM

## 2017-07-12 DIAGNOSIS — H538 Other visual disturbances: Secondary | ICD-10-CM

## 2017-07-12 DIAGNOSIS — R51 Headache: Secondary | ICD-10-CM

## 2017-07-12 MED ORDER — AZITHROMYCIN 250 MG PO TABS: ORAL_TABLET | ORAL | 1 refills | Status: AC

## 2017-07-12 MED ORDER — DEXAMETHASONE 0.5 MG PO TABS: ORAL_TABLET | ORAL | 0 refills | Status: DC

## 2017-07-12 NOTE — Patient Instructions (Signed)
Start nasal spray   Switch from claritin to allergra or zyrtec until symptoms clear up   Schedule an appointment with your ophthalmologist next week -     HOW TO TREAT VIRAL COUGH AND COLD SYMPTOMS:  -Symptoms usually last at least 1 week with the worst symptoms being around day 4.  - colds usually start with a sore throat and end with a cough, and the cough can take 2 weeks to get better.  -No antibiotics are needed for colds, flu, sore throats, cough, bronchitis UNLESS symptoms are longer than 7 days OR if you are getting better then get drastically worse.  -There are a lot of combination medications (Dayquil, Nyquil, Vicks 44, tyelnol cold and sinus, ETC). Please look at the ingredients on the back so that you are treating the correct symptoms and not doubling up on medications/ingredients.    Medicines you can use  Nasal congestion  Little Remedies saline spray (aerosol/mist)- can try this, it is in the kids section - pseudoephedrine (Sudafed)- behind the counter, do not use if you have high blood pressure, medicine that have -D in them.  - phenylephrine (Sudafed PE) -Dextormethorphan + chlorpheniramine (Coridcidin HBP)- okay if you have high blood pressure -Oxymetazoline (Afrin) nasal spray- LIMIT to 3 days -Saline nasal spray -Neti pot (used distilled or bottled water)  Ear pain/congestion  -pseudoephedrine (sudafed) - Nasonex/flonase nasal spray  Fever  -Acetaminophen (Tyelnol) -Ibuprofen (Advil, motrin, aleve)  Sore Throat  -Acetaminophen (Tyelnol) -Ibuprofen (Advil, motrin, aleve) -Drink a lot of water -Gargle with salt water - Rest your voice (don't talk) -Throat sprays -Cough drops  Body Aches  -Acetaminophen (Tyelnol) -Ibuprofen (Advil, motrin, aleve)  Headache  -Acetaminophen (Tyelnol) -Ibuprofen (Advil, motrin, aleve) - Exedrin, Exedrin Migraine  Allergy symptoms (cough, sneeze, runny nose, itchy eyes) -Claritin or loratadine cheapest but likely the  weakest  -Zyrtec or certizine at night because it can make you sleepy -The strongest is allegra or fexafinadine  Cheapest at [employer], [employer], [employer]  Cough  -Dextromethorphan (Delsym)- medicine that has DM in it -Guafenesin (Mucinex/Robitussin) - cough drops - drink lots of water  Chest Congestion  -Guafenesin (Mucinex/Robitussin)  Red Itchy Eyes  - Naphcon-A  Upset Stomach  - Bland diet (nothing spicy, greasy, fried, and high acid foods like tomatoes, oranges, berries) -OKAY- cereal, bread, soup, crackers, rice -Eat smaller more frequent meals -reduce caffeine, no alcohol -Loperamide (Imodium-AD) if diarrhea -Prevacid for heart burn  General health when sick  -Hydration -wash your hands frequently -keep surfaces clean -change pillow cases and sheets often -Get fresh air but do not exercise strenuously -Vitamin D, double up on it - Vitamin C -Zinc

## 2017-07-15 DIAGNOSIS — N184 Chronic kidney disease, stage 4 (severe): Secondary | ICD-10-CM | POA: Diagnosis not present

## 2017-07-15 DIAGNOSIS — Z48812 Encounter for surgical aftercare following surgery on the circulatory system: Secondary | ICD-10-CM | POA: Diagnosis not present

## 2017-07-15 DIAGNOSIS — F329 Major depressive disorder, single episode, unspecified: Secondary | ICD-10-CM | POA: Diagnosis not present

## 2017-07-15 DIAGNOSIS — I1310 Hypertensive heart and chronic kidney disease without heart failure, with stage 1 through stage 4 chronic kidney disease, or unspecified chronic kidney disease: Secondary | ICD-10-CM | POA: Diagnosis not present

## 2017-07-15 DIAGNOSIS — I5032 Chronic diastolic (congestive) heart failure: Secondary | ICD-10-CM | POA: Diagnosis not present

## 2017-07-15 DIAGNOSIS — Z992 Dependence on renal dialysis: Secondary | ICD-10-CM | POA: Diagnosis not present

## 2017-07-17 ENCOUNTER — Ambulatory Visit (INDEPENDENT_AMBULATORY_CARE_PROVIDER_SITE_OTHER): Payer: Self-pay | Admitting: Vascular Surgery

## 2017-07-17 ENCOUNTER — Other Ambulatory Visit: Payer: Self-pay

## 2017-07-17 ENCOUNTER — Encounter: Payer: Self-pay | Admitting: Vascular Surgery

## 2017-07-17 VITALS — BP 134/77 | HR 76 | Temp 98.3°F | Resp 14 | Ht 65.5 in | Wt 185.0 lb

## 2017-07-17 DIAGNOSIS — Z48812 Encounter for surgical aftercare following surgery on the circulatory system: Secondary | ICD-10-CM

## 2017-07-17 NOTE — Progress Notes (Signed)
   Patient name: Nancy Norris MRN: [id number] DOB: 1940/04/04 Sex: female  REASON FOR VISIT:   Follow-up after ligation of left brachiocephalic fistula and excision of large aneurysm  HPI:   Nancy Norris is a pleasant 77 y.o. female who is not on dialysis.  She had a left brachiocephalic fistula placed which was never used.  She developed a large aneurysm.  We discussed plication of the aneurysm however she wished to have this ligated and the aneurysm excised.  She returns for a follow-up visit.  She has no specific complaints except for a small pulsatile area at the distal aspect of her incision which I think is just the remnant where her fistula was ligated.  She has no pain or paresthesias in her left arm.  REVIEW OF SYSTEMS:  [X]  denotes positive finding, [ ]  denotes negative finding Cardiac  Comments:  Chest pain or chest pressure:    Shortness of breath upon exertion:    Short of breath when lying flat:    Irregular heart rhythm:    Constitutional    Fever or chills:     PHYSICAL EXAM:   Vitals:   07/17/17 1524  BP: 134/77  Pulse: 76  Resp: 14  Temp: 98.3 F (36.8 C)  TempSrc: Oral  SpO2: 100%  Weight: 185 lb (83.9 kg)  Height: 5' 5.5" (1.664 m)    GENERAL: The patient is a well-nourished female, in no acute distress. The vital signs are documented above. CARDIOVASCULAR: There is a regular rate and rhythm. PULMONARY: There is good air exchange bilaterally without wheezing or rales. Her incision is healing nicely. She has a palpable left radial pulse.  DATA:   No new data.  MEDICAL ISSUES:   STATUS POST LIGATION OF LEFT BRACHIOCEPHALIC FISTULA AND EXCISION OF LARGE ANEURYSM: The patient is doing well status post ligation of her AV fistula and removal of a large aneurysm.  Her incision is healing nicely.  There is a pulsatile area where the fistula was ligated as expected and I reassured her that this was not a problem.  I will see her back as  needed.  Deitra Mayo [facility] [phone]

## 2017-07-22 DIAGNOSIS — M9902 Segmental and somatic dysfunction of thoracic region: Secondary | ICD-10-CM | POA: Diagnosis not present

## 2017-07-22 DIAGNOSIS — M5032 Other cervical disc degeneration, mid-cervical region, unspecified level: Secondary | ICD-10-CM | POA: Diagnosis not present

## 2017-07-22 DIAGNOSIS — M5137 Other intervertebral disc degeneration, lumbosacral region: Secondary | ICD-10-CM | POA: Diagnosis not present

## 2017-07-22 DIAGNOSIS — M5416 Radiculopathy, lumbar region: Secondary | ICD-10-CM | POA: Diagnosis not present

## 2017-07-22 DIAGNOSIS — M9901 Segmental and somatic dysfunction of cervical region: Secondary | ICD-10-CM | POA: Diagnosis not present

## 2017-07-22 DIAGNOSIS — M9904 Segmental and somatic dysfunction of sacral region: Secondary | ICD-10-CM | POA: Diagnosis not present

## 2017-07-22 DIAGNOSIS — M9903 Segmental and somatic dysfunction of lumbar region: Secondary | ICD-10-CM | POA: Diagnosis not present

## 2017-07-22 DIAGNOSIS — M5415 Radiculopathy, thoracolumbar region: Secondary | ICD-10-CM | POA: Diagnosis not present

## 2017-07-25 DIAGNOSIS — M5415 Radiculopathy, thoracolumbar region: Secondary | ICD-10-CM | POA: Diagnosis not present

## 2017-07-25 DIAGNOSIS — M9904 Segmental and somatic dysfunction of sacral region: Secondary | ICD-10-CM | POA: Diagnosis not present

## 2017-07-25 DIAGNOSIS — M9903 Segmental and somatic dysfunction of lumbar region: Secondary | ICD-10-CM | POA: Diagnosis not present

## 2017-07-25 DIAGNOSIS — M5137 Other intervertebral disc degeneration, lumbosacral region: Secondary | ICD-10-CM | POA: Diagnosis not present

## 2017-07-25 DIAGNOSIS — M9902 Segmental and somatic dysfunction of thoracic region: Secondary | ICD-10-CM | POA: Diagnosis not present

## 2017-07-25 DIAGNOSIS — M5416 Radiculopathy, lumbar region: Secondary | ICD-10-CM | POA: Diagnosis not present

## 2017-07-25 DIAGNOSIS — M5032 Other cervical disc degeneration, mid-cervical region, unspecified level: Secondary | ICD-10-CM | POA: Diagnosis not present

## 2017-07-25 DIAGNOSIS — M9901 Segmental and somatic dysfunction of cervical region: Secondary | ICD-10-CM | POA: Diagnosis not present

## 2017-07-29 DIAGNOSIS — M9903 Segmental and somatic dysfunction of lumbar region: Secondary | ICD-10-CM | POA: Diagnosis not present

## 2017-07-29 DIAGNOSIS — M5416 Radiculopathy, lumbar region: Secondary | ICD-10-CM | POA: Diagnosis not present

## 2017-07-29 DIAGNOSIS — M5137 Other intervertebral disc degeneration, lumbosacral region: Secondary | ICD-10-CM | POA: Diagnosis not present

## 2017-07-29 DIAGNOSIS — M9904 Segmental and somatic dysfunction of sacral region: Secondary | ICD-10-CM | POA: Diagnosis not present

## 2017-07-29 DIAGNOSIS — M5032 Other cervical disc degeneration, mid-cervical region, unspecified level: Secondary | ICD-10-CM | POA: Diagnosis not present

## 2017-07-29 DIAGNOSIS — M5415 Radiculopathy, thoracolumbar region: Secondary | ICD-10-CM | POA: Diagnosis not present

## 2017-07-29 DIAGNOSIS — M9901 Segmental and somatic dysfunction of cervical region: Secondary | ICD-10-CM | POA: Diagnosis not present

## 2017-07-29 DIAGNOSIS — M9902 Segmental and somatic dysfunction of thoracic region: Secondary | ICD-10-CM | POA: Diagnosis not present

## 2017-08-01 DIAGNOSIS — M9904 Segmental and somatic dysfunction of sacral region: Secondary | ICD-10-CM | POA: Diagnosis not present

## 2017-08-01 DIAGNOSIS — M5415 Radiculopathy, thoracolumbar region: Secondary | ICD-10-CM | POA: Diagnosis not present

## 2017-08-01 DIAGNOSIS — M5032 Other cervical disc degeneration, mid-cervical region, unspecified level: Secondary | ICD-10-CM | POA: Diagnosis not present

## 2017-08-01 DIAGNOSIS — M5416 Radiculopathy, lumbar region: Secondary | ICD-10-CM | POA: Diagnosis not present

## 2017-08-01 DIAGNOSIS — M5137 Other intervertebral disc degeneration, lumbosacral region: Secondary | ICD-10-CM | POA: Diagnosis not present

## 2017-08-01 DIAGNOSIS — M9903 Segmental and somatic dysfunction of lumbar region: Secondary | ICD-10-CM | POA: Diagnosis not present

## 2017-08-01 DIAGNOSIS — M9901 Segmental and somatic dysfunction of cervical region: Secondary | ICD-10-CM | POA: Diagnosis not present

## 2017-08-01 DIAGNOSIS — M9902 Segmental and somatic dysfunction of thoracic region: Secondary | ICD-10-CM | POA: Diagnosis not present

## 2017-08-05 DIAGNOSIS — M9904 Segmental and somatic dysfunction of sacral region: Secondary | ICD-10-CM | POA: Diagnosis not present

## 2017-08-05 DIAGNOSIS — M5415 Radiculopathy, thoracolumbar region: Secondary | ICD-10-CM | POA: Diagnosis not present

## 2017-08-05 DIAGNOSIS — M9902 Segmental and somatic dysfunction of thoracic region: Secondary | ICD-10-CM | POA: Diagnosis not present

## 2017-08-05 DIAGNOSIS — M9903 Segmental and somatic dysfunction of lumbar region: Secondary | ICD-10-CM | POA: Diagnosis not present

## 2017-08-05 DIAGNOSIS — M5416 Radiculopathy, lumbar region: Secondary | ICD-10-CM | POA: Diagnosis not present

## 2017-08-05 DIAGNOSIS — M9901 Segmental and somatic dysfunction of cervical region: Secondary | ICD-10-CM | POA: Diagnosis not present

## 2017-08-05 DIAGNOSIS — M5137 Other intervertebral disc degeneration, lumbosacral region: Secondary | ICD-10-CM | POA: Diagnosis not present

## 2017-08-05 DIAGNOSIS — M5032 Other cervical disc degeneration, mid-cervical region, unspecified level: Secondary | ICD-10-CM | POA: Diagnosis not present

## 2017-08-06 ENCOUNTER — Encounter: Payer: Self-pay | Admitting: Adult Health

## 2017-08-06 DIAGNOSIS — M9904 Segmental and somatic dysfunction of sacral region: Secondary | ICD-10-CM | POA: Diagnosis not present

## 2017-08-06 DIAGNOSIS — M9901 Segmental and somatic dysfunction of cervical region: Secondary | ICD-10-CM | POA: Diagnosis not present

## 2017-08-06 DIAGNOSIS — M5032 Other cervical disc degeneration, mid-cervical region, unspecified level: Secondary | ICD-10-CM | POA: Diagnosis not present

## 2017-08-06 DIAGNOSIS — M9902 Segmental and somatic dysfunction of thoracic region: Secondary | ICD-10-CM | POA: Diagnosis not present

## 2017-08-06 DIAGNOSIS — M5137 Other intervertebral disc degeneration, lumbosacral region: Secondary | ICD-10-CM | POA: Diagnosis not present

## 2017-08-06 DIAGNOSIS — M5415 Radiculopathy, thoracolumbar region: Secondary | ICD-10-CM | POA: Diagnosis not present

## 2017-08-06 DIAGNOSIS — M5416 Radiculopathy, lumbar region: Secondary | ICD-10-CM | POA: Diagnosis not present

## 2017-08-06 DIAGNOSIS — M9903 Segmental and somatic dysfunction of lumbar region: Secondary | ICD-10-CM | POA: Diagnosis not present

## 2017-08-06 NOTE — Progress Notes (Deleted)
Assessment and Plan:  There are no diagnoses linked to this encounter.    Further disposition pending results of labs. Discussed med's effects and SE's.   Over 30 minutes of exam, counseling, chart review, and critical decision making was performed.   Future Appointments  Date Time Provider [place]  08/08/2017  9:30 AM Liane Comber, NP [facility] None  09/13/2017  9:45 AM [name], MD [facility] None  09/17/2017  8:00 AM [facility] None  09/24/2017  8:45 AM Curt Bears, MD [facility] None    ------------------------------------------------------------------------------------------------------------------   HPI 77 y.o.female presents for  Past Medical History:  Diagnosis Date  . Anemia   . Anginal pain ([place])    used NTG x 2 May 31 and 06/15/13   . Anxiety   . Arthritis   . B12 deficiency 12/04/2014  . Breast cancer ([place])   . Complication of anesthesia   . COPD (chronic obstructive pulmonary disease) ([place])   . 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 Depression   . Dizziness   . Dyspnea   . Fibromyalgia   . Fibromyalgia   . GERD (gastroesophageal reflux disease)   . Headache(784.0)   . Heart murmur   . Hemoptysis 10/21/2015  . History of blood transfusion    last one May 12   . Hx of cardiovascular stress test    LexiScan with low level exercise Myoview (02/2013): No ischemia, EF 72%; normal study  . Hx of echocardiogram    a.  Echocardiogram (12/26/2012): EF 01-09%, grade 1 diastolic dysfunction;   b.  Echocardiogram (02/2013): EF 55-60%, no WMA, trivial effusion  . Hyperkalemia   . Hyponatremia   . Hypotension   . Hypothyroidism   . Mucositis   . Multiple myeloma   . Myocardial infarction [facility])    in past, patient was unaware.   . Neuropathy   . Pain in joint, pelvic region and thigh 07/07/2015  . Pneumonia    several  . PONV (postoperative nausea and vomiting) 2008   after mastestomy     Allergies  Allergen  Reactions  . Codeine Anaphylaxis  . Latex Shortness Of Breath  . Chocolate     migraines  . Onion Other (See Comments)    Causes migraine headaches  . Oxycodone   . Zyprexa [Olanzapine] Other (See Comments)    Confusion , dizzy,unsteady  . Adhesive [Tape] Other (See Comments)    blisters  . Heparin Rash  . Hydrocodone Rash    With extreme itching  . Iodinated Diagnostic Agents Hives, Itching, Rash and Other (See Comments)    Happened 60 years ago Stage IV kidney function  . Promethazine-Dm Hives  . Sulfa Antibiotics Itching    Current Outpatient Medications on File Prior to Visit  Medication Sig  . acetaminophen (TYLENOL) 500 MG tablet Take 1,000 mg by mouth daily as needed for moderate pain or headache.  . albuterol (PROVENTIL HFA;VENTOLIN HFA) 108 (90 Base) MCG/ACT inhaler Inhale 1-2 puffs into the lungs every 6 (six) hours as needed for wheezing or shortness of breath (cough).  Marland Kitchen amLODipine (NORVASC) 5 MG tablet   . azelastine (ASTELIN) 0.1 % nasal spray Place 1 spray into both nostrils daily. Use in each nostril as directed  . buPROPion (WELLBUTRIN) 75 MG tablet Take 1 tablet (75 mg total) by mouth 2 (two) times daily.  . butalbital-acetaminophen-caffeine (FIORICET, ESGIC) 50-325-40 MG tablet Take 1 tablet by mouth every 4 (four) hours as needed for headache or migraine.   Marland Kitchen  Cholecalciferol 4000 UNITS TABS Take 4,000 Units by mouth daily.  . citalopram (CELEXA) 40 MG tablet TAKE 1 TABLET BY MOUTH DAILY OR AS DIRECTED ***NEEDS OFFICE VISIT FOR FURTHER REFILLS***  . cromolyn (OPTICROM) 4 % ophthalmic solution Place 1 drop into both eyes 4 (four) times daily as needed (irritation).   Marland Kitchen dexamethasone (DECADRON) 0.5 MG tablet Take 1 tablet PO BID for 5 days and then 1 tablet PO QDaily for 5 days.  . folic acid (FOLVITE) 1 MG tablet Take 1 tablet (1 mg total) by mouth daily.  . furosemide (LASIX) 40 MG tablet TAKE 1 TABLET BY MOUTH DAILY. (Patient taking differently: Take 40 mg by  mouth as needed for fluid)  . gabapentin (NEURONTIN) 300 MG capsule TAKE 1 CAPSULE BY MOUTH AT BEDTIME.  Marland Kitchen levothyroxine (SYNTHROID, LEVOTHROID) 150 MCG tablet TAKE 1 TABLET BY MOUTH DAILY. NEED OFFICE VISIT FOR FURTHER REFILLS.  Marland Kitchen loperamide (IMODIUM) 2 MG capsule Take 1 capsule (2 mg total) by mouth as needed for diarrhea or loose stools.  Marland Kitchen loratadine (CLARITIN) 10 MG tablet Take 10 mg by mouth daily.  . magic mouthwash SOLN Take 5 mLs by mouth 4 (four) times daily as needed for mouth pain.  . midodrine (PROAMATINE) 10 MG tablet Take 10 mg by mouth daily.  . montelukast (SINGULAIR) 10 MG tablet TAKE 1 TABLET BY MOUTH DAILY.  . Multiple Vitamins-Minerals (PRESERVISION AREDS 2) CAPS Take 1 capsule by mouth daily.  . nitroGLYCERIN (NITROSTAT) 0.4 MG SL tablet Place 0.4 mg under the tongue every 5 (five) minutes as needed for chest pain.   Marland Kitchen nystatin (MYCOSTATIN/NYSTOP) powder Apply topically 2 (two) times daily. (Patient taking differently: Apply 1 g topically daily. )  . oxyCODONE (ROXICODONE) 5 MG immediate release tablet Take 1 tablet (5 mg total) by mouth every 4 (four) hours as needed for severe pain. (Patient not taking: Reported on 07/17/2017)  . pilocarpine (SALAGEN) 5 MG tablet TAKE 1 TABLET BY MOUTH 3 TIMES DAILY FOR DRY MOUTH. (Patient taking differently: TAKE 1 TABLET BY MOUTH 2 TIMES DAILY FOR DRY MOUTH.)  . ranitidine (ZANTAC) 300 MG tablet TAKE 1 TO 2 TABLETS DAILY FOR HEARTBURN & REFLUX (Patient taking differently: Take 300 mg by mouth twice daily)  . senna (SENOKOT) 8.6 MG tablet Take 1 tablet by mouth daily as needed for constipation.   . traMADol (ULTRAM) 50 MG tablet Take 1 to 2 tablets by mouth 4 times a day as needed for pain. (Patient taking differently: Take 50 mg by mouth 2 (two) times daily as needed for moderate pain or severe pain. )  . umeclidinium-vilanterol (ANORO ELLIPTA) 62.5-25 MCG/INH AEPB Inhale 1 puff into the lungs daily as needed (shortness of breath).  . vitamin  B-12 (CYANOCOBALAMIN) 1000 MCG tablet Take 1 tablet (1,000 mcg total) by mouth daily. (Patient taking differently: Take 1,000 mcg by mouth 2 (two) times daily. )  . vitamin C (ASCORBIC ACID) 500 MG tablet Take 500 mg by mouth daily.   Current Facility-Administered Medications on File Prior to Visit  Medication  . ipratropium-albuterol (DUONEB) 0.5-2.5 (3) MG/3ML nebulizer solution 3 mL    ROS: all negative except above.   Physical Exam:  There were no vitals taken for this visit.  General Appearance: Well nourished, in no apparent distress. Eyes: PERRLA, EOMs, conjunctiva no swelling or erythema Sinuses: No Frontal/maxillary tenderness ENT/Mouth: Ext aud canals clear, TMs without erythema, bulging. No erythema, swelling, or exudate on post pharynx.  Tonsils not swollen or erythematous. Hearing  normal.  Neck: Supple, thyroid normal.  Respiratory: Respiratory effort normal, BS equal bilaterally without rales, rhonchi, wheezing or stridor.  Cardio: RRR with no MRGs. Brisk peripheral pulses without edema.  Abdomen: Soft, + BS.  Non tender, no guarding, rebound, hernias, masses. Lymphatics: Non tender without lymphadenopathy.  Musculoskeletal: Full ROM, 5/5 strength, normal gait.  Skin: Warm, dry without rashes, lesions, ecchymosis.  Neuro: Cranial nerves intact. Normal muscle tone, no cerebellar symptoms. Sensation intact.  Psych: Awake and oriented X 3, normal affect, Insight and Judgment appropriate.     Izora Ribas, NP 2:09 PM [facility]

## 2017-08-08 ENCOUNTER — Ambulatory Visit: Payer: Self-pay | Admitting: Adult Health

## 2017-08-12 DIAGNOSIS — M9902 Segmental and somatic dysfunction of thoracic region: Secondary | ICD-10-CM | POA: Diagnosis not present

## 2017-08-12 DIAGNOSIS — M5032 Other cervical disc degeneration, mid-cervical region, unspecified level: Secondary | ICD-10-CM | POA: Diagnosis not present

## 2017-08-12 DIAGNOSIS — M5415 Radiculopathy, thoracolumbar region: Secondary | ICD-10-CM | POA: Diagnosis not present

## 2017-08-12 DIAGNOSIS — M9901 Segmental and somatic dysfunction of cervical region: Secondary | ICD-10-CM | POA: Diagnosis not present

## 2017-08-12 DIAGNOSIS — M9904 Segmental and somatic dysfunction of sacral region: Secondary | ICD-10-CM | POA: Diagnosis not present

## 2017-08-12 DIAGNOSIS — M9903 Segmental and somatic dysfunction of lumbar region: Secondary | ICD-10-CM | POA: Diagnosis not present

## 2017-08-12 DIAGNOSIS — M5137 Other intervertebral disc degeneration, lumbosacral region: Secondary | ICD-10-CM | POA: Diagnosis not present

## 2017-08-12 DIAGNOSIS — M5416 Radiculopathy, lumbar region: Secondary | ICD-10-CM | POA: Diagnosis not present

## 2017-08-15 DIAGNOSIS — M9903 Segmental and somatic dysfunction of lumbar region: Secondary | ICD-10-CM | POA: Diagnosis not present

## 2017-08-15 DIAGNOSIS — M9902 Segmental and somatic dysfunction of thoracic region: Secondary | ICD-10-CM | POA: Diagnosis not present

## 2017-08-15 DIAGNOSIS — M5137 Other intervertebral disc degeneration, lumbosacral region: Secondary | ICD-10-CM | POA: Diagnosis not present

## 2017-08-15 DIAGNOSIS — M5416 Radiculopathy, lumbar region: Secondary | ICD-10-CM | POA: Diagnosis not present

## 2017-08-15 DIAGNOSIS — M5032 Other cervical disc degeneration, mid-cervical region, unspecified level: Secondary | ICD-10-CM | POA: Diagnosis not present

## 2017-08-15 DIAGNOSIS — M5415 Radiculopathy, thoracolumbar region: Secondary | ICD-10-CM | POA: Diagnosis not present

## 2017-08-15 DIAGNOSIS — M9904 Segmental and somatic dysfunction of sacral region: Secondary | ICD-10-CM | POA: Diagnosis not present

## 2017-08-15 DIAGNOSIS — M9901 Segmental and somatic dysfunction of cervical region: Secondary | ICD-10-CM | POA: Diagnosis not present

## 2017-08-20 DIAGNOSIS — M9903 Segmental and somatic dysfunction of lumbar region: Secondary | ICD-10-CM | POA: Diagnosis not present

## 2017-08-20 DIAGNOSIS — M9902 Segmental and somatic dysfunction of thoracic region: Secondary | ICD-10-CM | POA: Diagnosis not present

## 2017-08-20 DIAGNOSIS — M5032 Other cervical disc degeneration, mid-cervical region, unspecified level: Secondary | ICD-10-CM | POA: Diagnosis not present

## 2017-08-20 DIAGNOSIS — M9901 Segmental and somatic dysfunction of cervical region: Secondary | ICD-10-CM | POA: Diagnosis not present

## 2017-08-20 DIAGNOSIS — M5415 Radiculopathy, thoracolumbar region: Secondary | ICD-10-CM | POA: Diagnosis not present

## 2017-08-20 DIAGNOSIS — M5137 Other intervertebral disc degeneration, lumbosacral region: Secondary | ICD-10-CM | POA: Diagnosis not present

## 2017-08-20 DIAGNOSIS — M5416 Radiculopathy, lumbar region: Secondary | ICD-10-CM | POA: Diagnosis not present

## 2017-08-20 DIAGNOSIS — M9904 Segmental and somatic dysfunction of sacral region: Secondary | ICD-10-CM | POA: Diagnosis not present

## 2017-08-22 DIAGNOSIS — M9904 Segmental and somatic dysfunction of sacral region: Secondary | ICD-10-CM | POA: Diagnosis not present

## 2017-08-22 DIAGNOSIS — M9902 Segmental and somatic dysfunction of thoracic region: Secondary | ICD-10-CM | POA: Diagnosis not present

## 2017-08-22 DIAGNOSIS — M5416 Radiculopathy, lumbar region: Secondary | ICD-10-CM | POA: Diagnosis not present

## 2017-08-22 DIAGNOSIS — M5032 Other cervical disc degeneration, mid-cervical region, unspecified level: Secondary | ICD-10-CM | POA: Diagnosis not present

## 2017-08-22 DIAGNOSIS — M5415 Radiculopathy, thoracolumbar region: Secondary | ICD-10-CM | POA: Diagnosis not present

## 2017-08-22 DIAGNOSIS — M9903 Segmental and somatic dysfunction of lumbar region: Secondary | ICD-10-CM | POA: Diagnosis not present

## 2017-08-22 DIAGNOSIS — M9901 Segmental and somatic dysfunction of cervical region: Secondary | ICD-10-CM | POA: Diagnosis not present

## 2017-08-22 DIAGNOSIS — M5137 Other intervertebral disc degeneration, lumbosacral region: Secondary | ICD-10-CM | POA: Diagnosis not present

## 2017-08-26 DIAGNOSIS — M9904 Segmental and somatic dysfunction of sacral region: Secondary | ICD-10-CM | POA: Diagnosis not present

## 2017-08-26 DIAGNOSIS — M9903 Segmental and somatic dysfunction of lumbar region: Secondary | ICD-10-CM | POA: Diagnosis not present

## 2017-08-26 DIAGNOSIS — M9901 Segmental and somatic dysfunction of cervical region: Secondary | ICD-10-CM | POA: Diagnosis not present

## 2017-08-26 DIAGNOSIS — M5415 Radiculopathy, thoracolumbar region: Secondary | ICD-10-CM | POA: Diagnosis not present

## 2017-08-26 DIAGNOSIS — M5137 Other intervertebral disc degeneration, lumbosacral region: Secondary | ICD-10-CM | POA: Diagnosis not present

## 2017-08-26 DIAGNOSIS — M5416 Radiculopathy, lumbar region: Secondary | ICD-10-CM | POA: Diagnosis not present

## 2017-08-26 DIAGNOSIS — M5032 Other cervical disc degeneration, mid-cervical region, unspecified level: Secondary | ICD-10-CM | POA: Diagnosis not present

## 2017-08-26 DIAGNOSIS — M9902 Segmental and somatic dysfunction of thoracic region: Secondary | ICD-10-CM | POA: Diagnosis not present

## 2017-08-29 DIAGNOSIS — M9904 Segmental and somatic dysfunction of sacral region: Secondary | ICD-10-CM | POA: Diagnosis not present

## 2017-08-29 DIAGNOSIS — M5032 Other cervical disc degeneration, mid-cervical region, unspecified level: Secondary | ICD-10-CM | POA: Diagnosis not present

## 2017-08-29 DIAGNOSIS — M5416 Radiculopathy, lumbar region: Secondary | ICD-10-CM | POA: Diagnosis not present

## 2017-08-29 DIAGNOSIS — M5137 Other intervertebral disc degeneration, lumbosacral region: Secondary | ICD-10-CM | POA: Diagnosis not present

## 2017-08-29 DIAGNOSIS — M5415 Radiculopathy, thoracolumbar region: Secondary | ICD-10-CM | POA: Diagnosis not present

## 2017-08-29 DIAGNOSIS — M9901 Segmental and somatic dysfunction of cervical region: Secondary | ICD-10-CM | POA: Diagnosis not present

## 2017-08-29 DIAGNOSIS — M9902 Segmental and somatic dysfunction of thoracic region: Secondary | ICD-10-CM | POA: Diagnosis not present

## 2017-08-29 DIAGNOSIS — M9903 Segmental and somatic dysfunction of lumbar region: Secondary | ICD-10-CM | POA: Diagnosis not present

## 2017-09-13 ENCOUNTER — Encounter: Payer: Self-pay | Admitting: Internal Medicine

## 2017-09-13 ENCOUNTER — Ambulatory Visit (INDEPENDENT_AMBULATORY_CARE_PROVIDER_SITE_OTHER): Payer: Medicare Other | Admitting: Internal Medicine

## 2017-09-13 VITALS — BP 130/74 | HR 86 | Temp 97.6°F | Ht 65.5 in | Wt 179.0 lb

## 2017-09-13 DIAGNOSIS — E559 Vitamin D deficiency, unspecified: Secondary | ICD-10-CM

## 2017-09-13 DIAGNOSIS — N184 Chronic kidney disease, stage 4 (severe): Secondary | ICD-10-CM

## 2017-09-13 DIAGNOSIS — R7309 Other abnormal glucose: Secondary | ICD-10-CM

## 2017-09-13 DIAGNOSIS — E039 Hypothyroidism, unspecified: Secondary | ICD-10-CM

## 2017-09-13 DIAGNOSIS — K219 Gastro-esophageal reflux disease without esophagitis: Secondary | ICD-10-CM | POA: Diagnosis not present

## 2017-09-13 DIAGNOSIS — Z79899 Other long term (current) drug therapy: Secondary | ICD-10-CM | POA: Diagnosis not present

## 2017-09-13 DIAGNOSIS — E782 Mixed hyperlipidemia: Secondary | ICD-10-CM | POA: Diagnosis not present

## 2017-09-13 DIAGNOSIS — I951 Orthostatic hypotension: Secondary | ICD-10-CM | POA: Diagnosis not present

## 2017-09-13 NOTE — Patient Instructions (Signed)

## 2017-09-13 NOTE — Progress Notes (Signed)
This very nice 77 y.o. DWF  presents for 3 month follow up with labile HTN & Orthostatic Hypotension, HLD, Pre-Diabetes and Vitamin D Deficiency. Last year, she was treated with Diflucan for a Cryptococcal Lung Abscess.      Patient has hx/o Multiple Myeloma predating since 2000 & in 2010 had an autologous Stem cell transplant. In 2008, she had a Lt partial Mastectomy for DCIS.      Patient has been followed for labile HTN complicated by Dysautonomia and orthostatic Hypotension. Also patient has CKD4 (GFR 19 in June) Followed by Dr Marval Regal.  Patient did have an AVF which she recently had reversed as she declines to have Dialysis. Today's BP is at goal - 130/74. Patient has had no complaints of any cardiac type chest pain, palpitations, dyspnea / orthopnea / PND, dizziness, claudication, or dependent edema.     Hyperlipidemia is controlled with diet & meds. Patient denies myalgias or other med SE's. Last Lipids were not at goal: Lab Results  Component Value Date   CHOL 193 05/29/2017   HDL 45 (L) 05/29/2017   LDLCALC 124 (H) 05/29/2017   TRIG 126 05/29/2017   CHOLHDL 4.3 05/29/2017      Also, the patient has history of PreDiabetes and has had no symptoms of reactive hypoglycemia, diabetic polys, paresthesias or visual blurring.  Last A1c was Normal & at goal:  Lab Results  Component Value Date   HGBA1C 5.1 09/13/2017      Patient has been on Thyroid replacement since the 1980's. In May, off of meds, she did have an elevated TSH of 25 attributed to taking her thyroid meds w/Bkfst.      Further, the patient also has history of Vitamin D Deficiency and supplements vitamin D without any suspected side-effects. Last vitamin D was sl low (goal 70-100):  Lab Results  Component Value Date   VD25OH 39 09/13/2017    Allergies  Allergen Reactions  . Codeine Anaphylaxis  . Latex Shortness Of Breath  . Chocolate     migraines  . Onion Other (See Comments)    Causes migraine headaches    . Oxycodone   . Zyprexa [Olanzapine] Other (See Comments)    Confusion , dizzy,unsteady  . Adhesive [Tape] Other (See Comments)    blisters  . Heparin Rash  . Hydrocodone Rash    With extreme itching  . Iodinated Diagnostic Agents Hives, Itching, Rash and Other (See Comments)    Happened 60 years ago Stage IV kidney function  . Promethazine-Dm Hives  . Sulfa Antibiotics Itching   PMHx:   Past Medical History:  Diagnosis Date  . Anemia   . Anginal pain ([place])    used NTG x 2 May 31 and 06/15/13   . Anxiety   . Arthritis   . B12 deficiency 12/04/2014  . Breast cancer ([place])   . Complication of anesthesia   . COPD (chronic obstructive pulmonary disease) ([place])   . Cryptococcal pneumonitis ([place]) 11/22/2015  . Depression   . Dizziness   . Dyspnea   . Fibromyalgia   . Fibromyalgia   . GERD (gastroesophageal reflux disease)   . Headache(784.0)   . Heart murmur   . Hemoptysis 10/21/2015  . History of blood transfusion    last one May 12   . Hx of cardiovascular stress test    LexiScan with low level exercise Myoview (02/2013): No ischemia, EF 72%; normal study  . Hx of echocardiogram    a.  Echocardiogram (12/26/2012): EF 17-51%, grade 1 diastolic dysfunction;   b.  Echocardiogram (02/2013): EF 55-60%, no WMA, trivial effusion  . Hyperkalemia   . Hyponatremia   . Hypotension   . Hypothyroidism   . Mucositis   . Multiple myeloma   . Myocardial infarction [facility])    in past, patient was unaware.   . Neuropathy   . Pain in joint, pelvic region and thigh 07/07/2015  . Pneumonia    several  . PONV (postoperative nausea and vomiting) 2008   after mastestomy   Immunization History  Administered Date(s) Administered  . Influenza, High Dose Seasonal PF 09/30/2013  . Influenza,inj,Quad PF,6+ Mos 09/29/2015  . Influenza-Unspecified 11/05/2012, 11/03/2014, 10/05/2016  . Pneumococcal Conjugate-13 10/05/2013  . Pneumococcal Polysaccharide-23 03/29/2009  . Pneumococcal-Unspecified  03/16/2011  . Tdap 10/14/2009   Past Surgical History:  Procedure Laterality Date  . ABDOMINAL HYSTERECTOMY  1981  . AV FISTULA PLACEMENT Left 06/19/2013   Procedure: CREATION OF LEFT ARM ARTERIOVENOUS (AV) FISTULA ;  Surgeon: Angelia Mould, MD;  Location: [place];  Service: Vascular;  Laterality: Left;  . BREAST RECONSTRUCTION    . BREAST SURGERY Right    reduction  . CATARACT EXTRACTION, BILATERAL    . CHOLECYSTECTOMY  1971  . COLONOSCOPY    . EYE SURGERY Bilateral    lens implant  . history of Port removal    . LUNG BIOPSY  10/21/2015  . MASTECTOMY Left 2008  . PORTACATH PLACEMENT  12/2012   has had 2  . RESECTION OF ARTERIOVENOUS FISTULA ANEURYSM Left 06/04/2017   Procedure: LIGATION ANEURYSM OF LEFT ARTERIOVENOUS FISTULA;  Surgeon: Angelia Mould, MD;  Location: [place];  Service: Vascular;  Laterality: Left;  . Status post stem cell transplant on September 28, 2008.     FHx:    Reviewed / unchanged  SHx:    Reviewed / unchanged   Systems Review:  Constitutional: Denies fever, chills, wt changes, headaches, insomnia, fatigue, night sweats, change in appetite. Eyes: Denies redness, blurred vision, diplopia, discharge, itchy, watery eyes.  ENT: Denies discharge, congestion, post nasal drip, epistaxis, sore throat, earache, hearing loss, dental pain, tinnitus, vertigo, sinus pain, snoring.  CV: Denies chest pain, palpitations, irregular heartbeat, syncope, dyspnea, diaphoresis, orthopnea, PND, claudication or edema. Respiratory: denies cough, dyspnea, DOE, pleurisy, hoarseness, laryngitis, wheezing.  Gastrointestinal: Denies dysphagia, odynophagia, heartburn, reflux, water brash, abdominal pain or cramps, nausea, vomiting, bloating, diarrhea, constipation, hematemesis, melena, hematochezia  or hemorrhoids. Genitourinary: Denies dysuria, frequency, urgency, nocturia, hesitancy, discharge, hematuria or flank pain. Musculoskeletal: Denies arthralgias, myalgias,  stiffness, jt. swelling, pain, limping or strain/sprain.  Skin: Denies pruritus, rash, hives, warts, acne, eczema or change in skin lesion(s). Neuro: No weakness, tremor, incoordination, spasms, paresthesia or pain. Psychiatric: Denies confusion, memory loss or sensory loss. Endo: Denies change in weight, skin or hair change.  Heme/Lymph: No excessive bleeding, bruising or enlarged lymph nodes.  Physical Exam  BP 130/74 (BP Location: Left Arm) Comment (BP Location): BLOOD DRAWN IN RIGHT ARM  Pulse 86   Temp 97.6 F (36.4 C)   Ht 5' 5.5" (1.664 m)   Wt 179 lb (81.2 kg)   SpO2 98%   BMI 29.33 kg/m   Appears  well nourished, well groomed  and in no distress.  Eyes: PERRLA, EOMs, conjunctiva no swelling or erythema. Sinuses: No frontal/maxillary tenderness ENT/Mouth: EAC's clear, TM's nl w/o erythema, bulging. Nares clear w/o erythema, swelling, exudates. Oropharynx clear without erythema or exudates. Oral hygiene is good. Tongue normal,  non obstructing. Hearing intact.  Neck: Supple. Thyroid not palpable. Car 2+/2+ without bruits, nodes or JVD. Chest: Respirations nl with BS clear & equal w/o rales, rhonchi, wheezing or stridor.  Cor: Heart sounds normal w/ regular rate and rhythm without sig. murmurs, gallops, clicks or rubs. Peripheral pulses normal and equal  without edema.  Abdomen: Soft & bowel sounds normal. Non-tender w/o guarding, rebound, hernias, masses or organomegaly.  Lymphatics: Unremarkable.  Musculoskeletal: Full ROM all peripheral extremities, joint stability, 5/5 strength and normal gait.  Skin: Warm, dry without exposed rashes, lesions or ecchymosis apparent.  Neuro: Cranial nerves intact, reflexes equal bilaterally. Sensory-motor testing grossly intact. Tendon reflexes grossly intact.  Pysch: Alert & oriented x 3.  Insight and judgement nl & appropriate. No ideations.  Assessment and Plan:  1. Orthostatic hypotension  - Continue medication, monitor blood  pressure at home.  - Continue DASH diet.  Reminder to go to the ER if any CP,  SOB, nausea, dizziness, severe HA, changes vision/speech.  - CBC with Differential/Platelet - COMPLETE METABOLIC PANEL WITH GFR - Magnesium - TSH  2. Hyperlipidemia, mixed  - Continue diet/meds, exercise,& lifestyle modifications.  - Continue monitor periodic cholesterol/liver & renal functions    3. Abnormal glucose  - Continue diet, exercise, lifestyle modifications.  - Monitor appropriate labs.  - Hemoglobin A1c  4. Vitamin D deficiency  - Continue supplementation.   - VITAMIN D 25 Hydroxyl  5. Hypothyroidism  - TSH  6. CKD (chronic kidney disease) stage 4, GFR 15-29 ml/min (HCC)  - COMPLETE METABOLIC PANEL WITH GFR  7. Medication management   - CBC with Differential/Platelet - COMPLETE METABOLIC PANEL WITH GFR - Magnesium - TSH - Hemoglobin A1c - VITAMIN D 25 Hydroxy      Discussed  regular exercise, BP monitoring, weight control to achieve/maintain BMI less than 25 and discussed med and SE's. Recommended labs to assess and monitor clinical status with further disposition pending results of labs. Over 30 minutes of exam, counseling, chart review was performed.

## 2017-09-14 ENCOUNTER — Other Ambulatory Visit: Payer: Self-pay | Admitting: Internal Medicine

## 2017-09-14 LAB — CBC WITH DIFFERENTIAL/PLATELET
Basophils Absolute: 20 cells/uL (ref 0–200)
Basophils Relative: 0.4 %
EOS ABS: 191 {cells}/uL (ref 15–500)
Eosinophils Relative: 3.9 %
HEMATOCRIT: 34 % — AB (ref 35.0–45.0)
HEMOGLOBIN: 11.3 g/dL — AB (ref 11.7–15.5)
Lymphs Abs: 965 cells/uL (ref 850–3900)
MCH: 34.7 pg — ABNORMAL HIGH (ref 27.0–33.0)
MCHC: 33.2 g/dL (ref 32.0–36.0)
MCV: 104.3 fL — AB (ref 80.0–100.0)
MONOS PCT: 16.1 %
MPV: 10.3 fL (ref 7.5–12.5)
Neutro Abs: 2935 cells/uL (ref 1500–7800)
Neutrophils Relative %: 59.9 %
Platelets: 92 10*3/uL — ABNORMAL LOW (ref 140–400)
RBC: 3.26 10*6/uL — ABNORMAL LOW (ref 3.80–5.10)
RDW: 12.8 % (ref 11.0–15.0)
TOTAL LYMPHOCYTE: 19.7 %
WBC mixed population: 789 cells/uL (ref 200–950)
WBC: 4.9 10*3/uL (ref 3.8–10.8)

## 2017-09-14 LAB — COMPLETE METABOLIC PANEL WITH GFR
AG Ratio: 2.6 (calc) — ABNORMAL HIGH (ref 1.0–2.5)
ALT: 17 U/L (ref 6–29)
AST: 18 U/L (ref 10–35)
Albumin: 4.1 g/dL (ref 3.6–5.1)
Alkaline phosphatase (APISO): 118 U/L (ref 33–130)
BUN/Creatinine Ratio: 10 (calc) (ref 6–22)
BUN: 27 mg/dL — ABNORMAL HIGH (ref 7–25)
CALCIUM: 10 mg/dL (ref 8.6–10.4)
CO2: 25 mmol/L (ref 20–32)
CREATININE: 2.75 mg/dL — AB (ref 0.60–0.93)
Chloride: 107 mmol/L (ref 98–110)
GFR, EST NON AFRICAN AMERICAN: 16 mL/min/{1.73_m2} — AB (ref >=60)
GFR, Est African American: 19 mL/min/{1.73_m2} — ABNORMAL LOW (ref >=60)
Globulin: 1.6 g/dL (calc) — ABNORMAL LOW (ref 1.9–3.7)
Glucose, Bld: 123 mg/dL — ABNORMAL HIGH (ref 65–99)
Potassium: 3.6 mmol/L (ref 3.5–5.3)
Sodium: 141 mmol/L (ref 135–146)
Total Bilirubin: 0.6 mg/dL (ref 0.2–1.2)
Total Protein: 5.7 g/dL — ABNORMAL LOW (ref 6.1–8.1)

## 2017-09-14 LAB — MAGNESIUM: Magnesium: 2.2 mg/dL (ref 1.5–2.5)

## 2017-09-14 LAB — HEMOGLOBIN A1C
HEMOGLOBIN A1C: 5.1 %{Hb} (ref <5.7)
MEAN PLASMA GLUCOSE: 100 (calc)
eAG (mmol/L): 5.5 (calc)

## 2017-09-14 LAB — VITAMIN D 25 HYDROXY (VIT D DEFICIENCY, FRACTURES): VIT D 25 HYDROXY: 39 ng/mL (ref 30–100)

## 2017-09-14 LAB — TSH: TSH: 4.37 mIU/L (ref 0.40–4.50)

## 2017-09-16 ENCOUNTER — Encounter: Payer: Self-pay | Admitting: Internal Medicine

## 2017-09-16 MED ORDER — RANITIDINE HCL 300 MG PO TABS
ORAL_TABLET | ORAL | 3 refills | Status: DC
Start: 2017-09-16 — End: 2018-03-26

## 2017-09-17 ENCOUNTER — Inpatient Hospital Stay: Payer: Medicare Other | Attending: Internal Medicine

## 2017-09-17 DIAGNOSIS — N289 Disorder of kidney and ureter, unspecified: Secondary | ICD-10-CM | POA: Diagnosis not present

## 2017-09-17 DIAGNOSIS — Z86000 Personal history of in-situ neoplasm of breast: Secondary | ICD-10-CM | POA: Diagnosis not present

## 2017-09-17 DIAGNOSIS — C9000 Multiple myeloma not having achieved remission: Secondary | ICD-10-CM

## 2017-09-17 DIAGNOSIS — C9002 Multiple myeloma in relapse: Secondary | ICD-10-CM | POA: Insufficient documentation

## 2017-09-17 DIAGNOSIS — R110 Nausea: Secondary | ICD-10-CM | POA: Diagnosis not present

## 2017-09-17 LAB — CBC WITH DIFFERENTIAL (CANCER CENTER ONLY)
BASOS ABS: 0 10*3/uL (ref 0.0–0.1)
Basophils Relative: 0 %
Eosinophils Absolute: 0.2 10*3/uL (ref 0.0–0.5)
Eosinophils Relative: 5 %
HEMATOCRIT: 32.4 % — AB (ref 34.8–46.6)
Hemoglobin: 10.4 g/dL — ABNORMAL LOW (ref 11.6–15.9)
LYMPHS PCT: 23 %
Lymphs Abs: 0.9 10*3/uL (ref 0.9–3.3)
MCH: 34.7 pg — ABNORMAL HIGH (ref 25.1–34.0)
MCHC: 32.1 g/dL (ref 31.5–36.0)
MCV: 108 fL — AB (ref 79.5–101.0)
MONO ABS: 0.4 10*3/uL (ref 0.1–0.9)
Monocytes Relative: 11 %
NEUTROS ABS: 2.3 10*3/uL (ref 1.5–6.5)
NEUTROS PCT: 61 %
Platelet Count: 99 10*3/uL — ABNORMAL LOW (ref 145–400)
RBC: 3 MIL/uL — AB (ref 3.70–5.45)
RDW: 13.6 % (ref 11.2–14.5)
WBC Count: 3.8 10*3/uL — ABNORMAL LOW (ref 3.9–10.3)

## 2017-09-17 LAB — CMP (CANCER CENTER ONLY)
ALBUMIN: 3.5 g/dL (ref 3.5–5.0)
ALT: 22 U/L (ref 0–44)
ANION GAP: 11 (ref 5–15)
AST: 23 U/L (ref 15–41)
Alkaline Phosphatase: 136 U/L — ABNORMAL HIGH (ref 38–126)
BILIRUBIN TOTAL: 0.6 mg/dL (ref 0.3–1.2)
BUN: 36 mg/dL — ABNORMAL HIGH (ref 8–23)
CO2: 25 mmol/L (ref 22–32)
Calcium: 10.5 mg/dL — ABNORMAL HIGH (ref 8.9–10.3)
Chloride: 108 mmol/L (ref 98–111)
Creatinine: 3.08 mg/dL (ref 0.44–1.00)
GFR, EST AFRICAN AMERICAN: 16 mL/min — AB (ref >60)
GFR, Estimated: 14 mL/min — ABNORMAL LOW (ref >60)
GLUCOSE: 108 mg/dL — AB (ref 70–99)
POTASSIUM: 4.2 mmol/L (ref 3.5–5.1)
SODIUM: 144 mmol/L (ref 135–145)
TOTAL PROTEIN: 5.7 g/dL — AB (ref 6.5–8.1)

## 2017-09-17 LAB — LACTATE DEHYDROGENASE: LDH: 183 U/L (ref 98–192)

## 2017-09-18 LAB — IGG, IGA, IGM
IGM (IMMUNOGLOBULIN M), SRM: 10 mg/dL — AB (ref 26–217)
IgA: 13 mg/dL — ABNORMAL LOW (ref 64–422)
IgG (Immunoglobin G), Serum: 329 mg/dL — ABNORMAL LOW (ref 700–1600)

## 2017-09-18 LAB — KAPPA/LAMBDA LIGHT CHAINS
Kappa free light chain: 30.2 mg/L — ABNORMAL HIGH (ref 3.3–19.4)
Kappa, lambda light chain ratio: 4.25 — ABNORMAL HIGH (ref 0.26–1.65)
LAMDA FREE LIGHT CHAINS: 7.1 mg/L (ref 5.7–26.3)

## 2017-09-18 LAB — BETA 2 MICROGLOBULIN, SERUM: Beta-2 Microglobulin: 8.9 mg/L — ABNORMAL HIGH (ref 0.6–2.4)

## 2017-09-24 ENCOUNTER — Encounter: Payer: Self-pay | Admitting: Internal Medicine

## 2017-09-24 ENCOUNTER — Telehealth: Payer: Self-pay | Admitting: Internal Medicine

## 2017-09-24 ENCOUNTER — Inpatient Hospital Stay (HOSPITAL_BASED_OUTPATIENT_CLINIC_OR_DEPARTMENT_OTHER): Payer: Medicare Other | Admitting: Internal Medicine

## 2017-09-24 VITALS — BP 115/72 | HR 77 | Temp 98.6°F | Resp 18 | Ht 65.5 in | Wt 183.6 lb

## 2017-09-24 DIAGNOSIS — Z86000 Personal history of in-situ neoplasm of breast: Secondary | ICD-10-CM | POA: Diagnosis not present

## 2017-09-24 DIAGNOSIS — C9002 Multiple myeloma in relapse: Secondary | ICD-10-CM

## 2017-09-24 DIAGNOSIS — N289 Disorder of kidney and ureter, unspecified: Secondary | ICD-10-CM | POA: Diagnosis not present

## 2017-09-24 DIAGNOSIS — C9000 Multiple myeloma not having achieved remission: Secondary | ICD-10-CM

## 2017-09-24 DIAGNOSIS — R110 Nausea: Secondary | ICD-10-CM

## 2017-09-24 DIAGNOSIS — R112 Nausea with vomiting, unspecified: Secondary | ICD-10-CM

## 2017-09-24 NOTE — Progress Notes (Signed)
[place] Telephone:([phone]   Fax:([phone]  OFFICE PROGRESS NOTE  [name], [address] [place] [address] [place] [address]  DIAGNOSIS:  1. 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 Recurrent multiple myeloma initially diagnosed as a smoldering myeloma at [facility] in 2002. 3. Ductal carcinoma in situ status post mastectomy with sentinel lymph node biopsy in October 2008.  PRIOR THERAPY: 1. Status post treatment with tamoxifen from November 2008 through February 2009, discontinued secondary to intolerance. 2. Status post 3 cycles of chemotherapy with Revlimid and Decadron followed by 1 cycle of Decadron only with mild response. 3. Status post 2 cycles of systemic chemotherapy with Velcade, Doxil and Decadron discontinued secondary to significant peripheral neuropathy. Last dose was given May 2010 at [facility]. 4. Status post autologous peripheral blood stem cell transplant on October 01, 2008 at [facility] under the care of Dr. Phyllis Ginger.  5. Systemic chemotherapy with Carfilzomib initially was 20 mg/M2 and will be increased after cycle #1 to 36 mg/M2 on days 1, 2, 8, 9, 15 and 16 every 4 weeks in addition to Cytoxan 300 mg/M2 and Decadron 40 mg by mouth weekly basis, status post 4 cycles. First cycle on 12/29/2012. 6. She resumed chemotherapy with Carfilzomib, Cytoxan, and Decadron on 05/03/2014. Status post 6 cycles. 7. systemic chemotherapy again with Carfilzomib, Cytoxan and Decadron on 07/11/2015. Status post 3 cycles. 8. She completed a course of treatment with Diflucan 200 mg by mouth daily for around 6 weeks for cryptococcal infection of the lung.  CURRENT THERAPY: Resuming treatment again with Carfilzomib, Cytoxan and Decadron, first cycle 04/23/2016. Status post 7 cycles.  Treatment is currently on hold.  INTERVAL HISTORY: Nancy Norris 77 y.o. female returns to the clinic  today for follow-up visit.  The patient is feeling fine today with no concerning complaints except for mild nausea started few days ago.  She does not like to take Compazine because of the drowsiness.  She denied having any recent weight loss or night sweats.  She has no chest pain, shortness of breath, cough or hemoptysis.  She denied having any fever or chills.  She has no headache or visual changes.  She has been in observation for several months now with no concerning complaints.  She had repeat myeloma panel performed recently and she is here for evaluation and discussion of her lab results.  MEDICAL HISTORY: Past Medical History:  Diagnosis Date  . Anemia   . Anginal pain ([place])    used NTG x 2 May 31 and 06/15/13   . Anxiety   . Arthritis   . B12 deficiency 12/04/2014  . Breast cancer ([place])   . Complication of anesthesia   . COPD (chronic obstructive pulmonary disease) ([place])   . Cryptococcal pneumonitis ([place]) 11/22/2015  . Depression   . Dizziness   . Dyspnea   . Fibromyalgia   . Fibromyalgia   . GERD (gastroesophageal reflux disease)   . Headache(784.0)   . Heart murmur   . Hemoptysis 10/21/2015  . History of blood transfusion    last one May 12   . Hx of cardiovascular stress test    LexiScan with low level exercise Myoview (02/2013): No ischemia, EF 72%; normal study  . Hx of echocardiogram    a.  Echocardiogram (12/26/2012): EF 22-29%, grade 1 diastolic dysfunction;   b.  Echocardiogram (02/2013): EF 55-60%, no WMA, trivial effusion  .  Hyperkalemia   . Hyponatremia   . Hypotension   . Hypothyroidism   . Mucositis   . Multiple myeloma   . Myocardial infarction [facility])    in past, patient was unaware.   . Neuropathy   . Pain in joint, pelvic region and thigh 07/07/2015  . Pneumonia    several  . PONV (postoperative nausea and vomiting) 2008   after mastestomy    ALLERGIES:  is allergic to codeine; latex; chocolate; onion; oxycodone; zyprexa [olanzapine]; adhesive [tape];  heparin; hydrocodone; iodinated diagnostic agents; promethazine-dm; and sulfa antibiotics.  MEDICATIONS:    SURGICAL HISTORY:  Past Surgical History:  Procedure Laterality Date  . ABDOMINAL HYSTERECTOMY  1981  . AV FISTULA PLACEMENT Left 06/19/2013   Procedure: CREATION OF LEFT ARM ARTERIOVENOUS (AV) FISTULA ;  Surgeon: Angelia Mould, MD;  Location: [place];  Service: Vascular;  Laterality: Left;  . BREAST RECONSTRUCTION    . BREAST SURGERY Right    reduction  . CATARACT EXTRACTION, BILATERAL    . CHOLECYSTECTOMY  1971  . COLONOSCOPY    . EYE SURGERY Bilateral    lens implant  . history of Port removal    . LUNG BIOPSY  10/21/2015  . MASTECTOMY Left 2008  . PORTACATH PLACEMENT  12/2012   has had 2  . RESECTION OF ARTERIOVENOUS FISTULA ANEURYSM Left 06/04/2017   Procedure: LIGATION ANEURYSM OF LEFT ARTERIOVENOUS FISTULA;  Surgeon: Angelia Mould, MD;  Location: [place];  Service: Vascular;  Laterality: Left;  . Status post stem cell transplant on September 28, 2008.      REVIEW OF SYSTEMS:  A comprehensive review of systems was negative except for: Constitutional: positive for fatigue Gastrointestinal: positive for nausea   PHYSICAL EXAMINATION: General appearance: alert, cooperative, fatigued and no distress Head: Normocephalic, without obvious abnormality, atraumatic Neck: no adenopathy, no JVD, supple, symmetrical, trachea midline and thyroid not enlarged, symmetric, no tenderness/mass/nodules Lymph nodes: Cervical, supraclavicular, and axillary nodes normal. Resp: clear to auscultation bilaterally Back: symmetric, no curvature. ROM normal. No CVA tenderness. Cardio: regular rate and rhythm, S1, S2 normal, no murmur, click, rub or gallop GI: soft, non-tender; bowel sounds normal; no masses,  no organomegaly Extremities: extremities normal, atraumatic, no cyanosis or edema  ECOG PERFORMANCE STATUS: 1 - Symptomatic but completely ambulatory  Blood pressure 115/72,  pulse 77, temperature 98.6 F (37 C), temperature source Oral, resp. rate 18, height 5' 5.5" (1.664 m), weight 183 lb 9.6 oz (83.3 kg), SpO2 97 %.  LABORATORY DATA: Lab Results  Component Value Date   WBC 3.8 (L) 09/17/2017   HGB 10.4 (L) 09/17/2017   HCT 32.4 (L) 09/17/2017   MCV 108.0 (H) 09/17/2017   PLT 99 (L) 09/17/2017      Chemistry      Component Value Date/Time   NA 144 09/17/2017 0753   NA 144 01/17/2017 1146   K 4.2 09/17/2017 0753   K 3.7 01/17/2017 1146   CL 108 09/17/2017 0753   CL 105 03/19/2012 0811   CO2 25 09/17/2017 0753   CO2 25 01/17/2017 1146   BUN 36 (H) 09/17/2017 0753   BUN 35.0 (H) 01/17/2017 1146   CREATININE 3.08 (HH) 09/17/2017 0753   CREATININE 2.75 (H) 09/13/2017 0931   CREATININE 2.8 (H) 01/17/2017 1146      Component Value Date/Time   CALCIUM 10.5 (H) 09/17/2017 0753   CALCIUM 9.1 01/17/2017 1146   ALKPHOS 136 (H) 09/17/2017 0753   ALKPHOS 110 01/17/2017 1146   AST  23 09/17/2017 0753   AST 20 01/17/2017 1146   ALT 22 09/17/2017 0753   ALT 22 01/17/2017 1146   BILITOT 0.6 09/17/2017 0753   BILITOT 0.73 01/17/2017 1146       RADIOGRAPHIC STUDIES: No results found.  ASSESSMENT AND PLAN:  This is a very pleasant 77 years old white female with relapsed multiple myeloma status post several chemotherapy regimens and she is currently on treatment with systemic chemotherapy with Carfilzomib, Cytoxan and Decadron status post 7 cycles. The patient is currently on observation and she is feeling fine. Repeat myeloma panel showed no concerning findings for disease progression. I discussed the lab results with the patient and recommended for her to continue on observation with repeat myeloma panel in 3 months. For the renal insufficiency she will continue her current follow-up visit and evaluation by Dr. Marval Regal.  She was advised to call immediately if she has any concerning symptoms in the interval. The patient voices understanding of  current disease status and treatment options and is in agreement with the current care plan. All questions were answered. The patient knows to call the clinic with any problems, questions or concerns. We can certainly see the patient much sooner if necessary.  Disclaimer: This note was dictated with voice recognition software. Similar sounding words can inadvertently be transcribed and may not be corrected upon review.

## 2017-09-24 NOTE — Telephone Encounter (Signed)
Scheduled appt per 9/10 los- pt aware of appts per patient not print out wanted.

## 2017-10-02 ENCOUNTER — Other Ambulatory Visit: Payer: Self-pay | Admitting: Internal Medicine

## 2017-10-03 ENCOUNTER — Other Ambulatory Visit: Payer: Self-pay | Admitting: Internal Medicine

## 2017-10-04 ENCOUNTER — Other Ambulatory Visit: Payer: Self-pay | Admitting: Internal Medicine

## 2017-10-04 DIAGNOSIS — K219 Gastro-esophageal reflux disease without esophagitis: Secondary | ICD-10-CM

## 2017-10-04 MED ORDER — SUCRALFATE 1 G PO TABS: ORAL_TABLET | ORAL | 1 refills | Status: DC

## 2017-10-15 ENCOUNTER — Ambulatory Visit: Payer: Medicare Other | Admitting: Podiatry

## 2017-10-18 ENCOUNTER — Other Ambulatory Visit: Payer: Self-pay | Admitting: Internal Medicine

## 2017-10-21 DIAGNOSIS — M5416 Radiculopathy, lumbar region: Secondary | ICD-10-CM | POA: Diagnosis not present

## 2017-10-21 DIAGNOSIS — M9901 Segmental and somatic dysfunction of cervical region: Secondary | ICD-10-CM | POA: Diagnosis not present

## 2017-10-21 DIAGNOSIS — M5032 Other cervical disc degeneration, mid-cervical region, unspecified level: Secondary | ICD-10-CM | POA: Diagnosis not present

## 2017-10-21 DIAGNOSIS — M5137 Other intervertebral disc degeneration, lumbosacral region: Secondary | ICD-10-CM | POA: Diagnosis not present

## 2017-10-21 DIAGNOSIS — M5415 Radiculopathy, thoracolumbar region: Secondary | ICD-10-CM | POA: Diagnosis not present

## 2017-10-21 DIAGNOSIS — M9902 Segmental and somatic dysfunction of thoracic region: Secondary | ICD-10-CM | POA: Diagnosis not present

## 2017-10-21 DIAGNOSIS — M9903 Segmental and somatic dysfunction of lumbar region: Secondary | ICD-10-CM | POA: Diagnosis not present

## 2017-10-21 DIAGNOSIS — M9904 Segmental and somatic dysfunction of sacral region: Secondary | ICD-10-CM | POA: Diagnosis not present

## 2017-10-29 DIAGNOSIS — N390 Urinary tract infection, site not specified: Secondary | ICD-10-CM | POA: Diagnosis not present

## 2017-10-29 DIAGNOSIS — R809 Proteinuria, unspecified: Secondary | ICD-10-CM | POA: Diagnosis not present

## 2017-10-29 DIAGNOSIS — N2581 Secondary hyperparathyroidism of renal origin: Secondary | ICD-10-CM | POA: Diagnosis not present

## 2017-10-29 DIAGNOSIS — D630 Anemia in neoplastic disease: Secondary | ICD-10-CM | POA: Diagnosis not present

## 2017-10-29 DIAGNOSIS — N184 Chronic kidney disease, stage 4 (severe): Secondary | ICD-10-CM | POA: Diagnosis not present

## 2017-10-29 DIAGNOSIS — D631 Anemia in chronic kidney disease: Secondary | ICD-10-CM | POA: Diagnosis not present

## 2017-10-29 DIAGNOSIS — I951 Orthostatic hypotension: Secondary | ICD-10-CM | POA: Diagnosis not present

## 2017-10-29 DIAGNOSIS — B450 Pulmonary cryptococcosis: Secondary | ICD-10-CM | POA: Diagnosis not present

## 2017-10-29 DIAGNOSIS — I770 Arteriovenous fistula, acquired: Secondary | ICD-10-CM | POA: Diagnosis not present

## 2017-10-29 DIAGNOSIS — R911 Solitary pulmonary nodule: Secondary | ICD-10-CM | POA: Diagnosis not present

## 2017-10-29 DIAGNOSIS — C9000 Multiple myeloma not having achieved remission: Secondary | ICD-10-CM | POA: Diagnosis not present

## 2017-10-29 DIAGNOSIS — I5030 Unspecified diastolic (congestive) heart failure: Secondary | ICD-10-CM | POA: Diagnosis not present

## 2017-10-31 ENCOUNTER — Ambulatory Visit: Payer: Medicare Other | Admitting: Podiatry

## 2017-11-02 ENCOUNTER — Ambulatory Visit (INDEPENDENT_AMBULATORY_CARE_PROVIDER_SITE_OTHER): Payer: Medicare Other

## 2017-11-02 ENCOUNTER — Encounter: Payer: Self-pay | Admitting: Podiatry

## 2017-11-02 ENCOUNTER — Ambulatory Visit (INDEPENDENT_AMBULATORY_CARE_PROVIDER_SITE_OTHER): Payer: Medicare Other | Admitting: Podiatry

## 2017-11-02 DIAGNOSIS — M7742 Metatarsalgia, left foot: Secondary | ICD-10-CM | POA: Diagnosis not present

## 2017-11-02 DIAGNOSIS — M2042 Other hammer toe(s) (acquired), left foot: Secondary | ICD-10-CM | POA: Diagnosis not present

## 2017-11-02 DIAGNOSIS — M7752 Other enthesopathy of left foot: Secondary | ICD-10-CM

## 2017-11-02 NOTE — Progress Notes (Signed)
Subjective:   Patient ID: Nancy Norris, female   DOB: 77 y.o.   MRN: [id number]   HPI 77 year old female presents the office with concerns of a "knot" on the ball of her left foot which is been ongoing for about 8 years.  She states is gradually getting worse gets worse if she goes barefoot.  Denies any recent injury or trauma.  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  She has no other concerns today.   Review of Systems  All other systems reviewed and are negative.      Past Medical History:  Diagnosis Date  . Anemia   . Anginal pain ([place])    used NTG x 2 May 31 and 06/15/13   . Anxiety   . Arthritis   . B12 deficiency 12/04/2014  . Breast cancer ([place])   . Complication of anesthesia   . COPD (chronic obstructive pulmonary disease) ([place])   . Cryptococcal pneumonitis ([place]) 11/22/2015  . Depression   . Dizziness   . Dyspnea   . Fibromyalgia   . Fibromyalgia   . GERD (gastroesophageal reflux disease)   . Headache(784.0)   . Heart murmur   . Hemoptysis 10/21/2015  . History of blood transfusion    last one May 12   . Hx of cardiovascular stress test    LexiScan with low level exercise Myoview (02/2013): No ischemia, EF 72%; normal study  . Hx of echocardiogram    a.  Echocardiogram (12/26/2012): EF 62-95%, grade 1 diastolic dysfunction;   b.  Echocardiogram (02/2013): EF 55-60%, no WMA, trivial effusion  . Hyperkalemia   . Hyponatremia   . Hypotension   . Hypothyroidism   . Mucositis   . Multiple myeloma   . Myocardial infarction [facility])    in past, patient was unaware.   . Neuropathy   . Pain in joint, pelvic region and thigh 07/07/2015  . Pneumonia    several  . PONV (postoperative nausea and vomiting) 2008   after mastestomy    Past Surgical History:  Procedure Laterality Date  . ABDOMINAL HYSTERECTOMY  1981  . AV FISTULA PLACEMENT Left 06/19/2013   Procedure: CREATION OF LEFT ARM ARTERIOVENOUS (AV) FISTULA ;  Surgeon: Angelia Mould, MD;  Location: [facility];  Service: Vascular;  Laterality: Left;  . BREAST RECONSTRUCTION    . BREAST SURGERY Right    reduction  . CATARACT EXTRACTION, BILATERAL    . CHOLECYSTECTOMY  1971  . COLONOSCOPY    . EYE SURGERY Bilateral    lens implant  . history of Port removal    . LUNG BIOPSY  10/21/2015  . MASTECTOMY Left 2008  . PORTACATH PLACEMENT  12/2012   has had 2  . RESECTION OF ARTERIOVENOUS FISTULA ANEURYSM Left 06/04/2017   Procedure: LIGATION ANEURYSM OF LEFT ARTERIOVENOUS FISTULA;  Surgeon: Angelia Mould, MD;  Location: [facility];  Service: Vascular;  Laterality: Left;  . Status post stem cell transplant on September 28, 2008.       Current Outpatient Medications:  .  acetaminophen (TYLENOL) 500 MG tablet, Take 1,000 mg by mouth daily as needed for moderate pain or headache., Disp: , Rfl:  .  albuterol (PROVENTIL HFA;VENTOLIN HFA) 108 (90 Base) MCG/ACT inhaler, Inhale 1-2 puffs into the lungs every 6 (six) hours as needed for wheezing or shortness of breath (cough)., Disp: 1 Inhaler, Rfl: 3 .  amLODipine (NORVASC) 5 MG tablet, , Disp: , Rfl: 6 .  azelastine (ASTELIN) 0.1 %  nasal spray, Place 1 spray into both nostrils daily. Use in each nostril as directed, Disp: , Rfl:  .  buPROPion (WELLBUTRIN) 75 MG tablet, Take 1 tablet (75 mg total) by mouth 2 (two) times daily., Disp: 60 tablet, Rfl: 2 .  butalbital-acetaminophen-caffeine (FIORICET, ESGIC) 50-325-40 MG tablet, Take 1 tablet by mouth every 4 (four) hours as needed for headache or migraine. , Disp: , Rfl: 3 .  citalopram (CELEXA) 40 MG tablet, Take 1 tablet daily for Mood, Disp: 90 tablet, Rfl: 1 .  cromolyn (OPTICROM) 4 % ophthalmic solution, Place 1 drop into both eyes 4 (four) times daily as needed (irritation). , Disp: , Rfl:  .  folic acid (FOLVITE) 1 MG tablet, Take 1 tablet (1 mg total) by mouth daily., Disp: 30 tablet, Rfl: 3 .  furosemide (LASIX) 40 MG tablet, TAKE 1 TABLET BY MOUTH DAILY. (Patient taking differently: Take 40 mg  by mouth as needed for fluid), Disp: 90 tablet, Rfl: 1 .  gabapentin (NEURONTIN) 300 MG capsule, TAKE 1 CAPSULE BY MOUTH AT BEDTIME., Disp: 90 capsule, Rfl: 3 .  levothyroxine (SYNTHROID, LEVOTHROID) 150 MCG tablet, TAKE 1 TABLET BY MOUTH DAILY. NEED OFFICE VISIT FOR FURTHER REFILLS., Disp: 90 tablet, Rfl: 0 .  loperamide (IMODIUM) 2 MG capsule, Take 1 capsule (2 mg total) by mouth as needed for diarrhea or loose stools., Disp: 30 capsule, Rfl: 2 .  loratadine (CLARITIN) 10 MG tablet, Take 10 mg by mouth daily., Disp: , Rfl:  .  magic mouthwash SOLN, Take 5 mLs by mouth 4 (four) times daily as needed for mouth pain., Disp: 240 mL, Rfl: 1 .  midodrine (PROAMATINE) 10 MG tablet, Take 10 mg by mouth daily., Disp: , Rfl:  .  montelukast (SINGULAIR) 10 MG tablet, Take 1 tablet daily for Allergies, Disp: 90 tablet, Rfl: 1 .  Multiple Vitamins-Minerals (PRESERVISION AREDS 2) CAPS, Take 1 capsule by mouth daily., Disp: , Rfl:  .  nitroGLYCERIN (NITROSTAT) 0.4 MG SL tablet, Place 0.4 mg under the tongue every 5 (five) minutes as needed for chest pain. , Disp: , Rfl:  .  nystatin (MYCOSTATIN/NYSTOP) powder, Apply topically 2 (two) times daily. (Patient taking differently: Apply 1 g topically daily. ), Disp: 30 g, Rfl: 0 .  pilocarpine (SALAGEN) 5 MG tablet, TAKE 1 TABLET BY MOUTH 3 TIMES DAILY FOR DRY MOUTH. (Patient taking differently: TAKE 1 TABLET BY MOUTH 2 TIMES DAILY FOR DRY MOUTH.), Disp: 270 tablet, Rfl: 1 .  ranitidine (ZANTAC) 300 MG tablet, Take 1 tablet 2 x /day at [facility] & Bedtime for Heart burn & Reflux, Disp: 180 tablet, Rfl: 3 .  senna (SENOKOT) 8.6 MG tablet, Take 1 tablet by mouth daily as needed for constipation. , Disp: , Rfl:  .  sucralfate (CARAFATE) 1 g tablet, Take 1 tablet dissolved in water  4 x /day BEFORE Meals & Bedtime for Acid indigestion & Reflux, Disp: 360 tablet, Rfl: 1 .  traMADol (ULTRAM) 50 MG tablet, Take 1 to 2 tablets by mouth 4 times a day as needed for pain. (Patient  taking differently: Take 50 mg by mouth 2 (two) times daily as needed for moderate pain or severe pain. ), Disp: 180 tablet, Rfl: 0 .  umeclidinium-vilanterol (ANORO ELLIPTA) 62.5-25 MCG/INH AEPB, Inhale 1 puff into the lungs daily as needed (shortness of breath)., Disp: , Rfl:  .  vitamin B-12 (CYANOCOBALAMIN) 1000 MCG tablet, Take 1 tablet (1,000 mcg total) by mouth daily. (Patient taking differently: Take 1,000 mcg by  mouth 2 (two) times daily. ), Disp: 30 tablet, Rfl: 0  Current Facility-Administered Medications:  .  ipratropium-albuterol (DUONEB) 0.5-2.5 (3) MG/3ML nebulizer solution 3 mL, 3 mL, Nebulization, Once, Liane Comber, NP  Allergies  Allergen Reactions  . Codeine Anaphylaxis    anaphylasix tolarates oxycodone per care cast  . Latex Shortness Of Breath  . Chocolate     migraines  . Onion Other (See Comments)    Causes migraine headaches  . Oxycodone   . Zyprexa [Olanzapine] Other (See Comments)    Confusion , dizzy,unsteady  . Adhesive [Tape] Other (See Comments)    blisters  . Heparin Rash  . Hydrocodone Rash    With extreme itching  . Iodinated Diagnostic Agents Hives, Itching, Rash and Other (See Comments)    Happened 60 years ago Stage IV kidney function  . Promethazine-Dm Hives  . Sulfa Antibiotics Itching        Objective:  Physical Exam  General: AAO x3, NAD  Dermatological: Skin is warm, dry and supple bilateral. Nails x 10 are well manicured; remaining integument appears unremarkable at this time. There are no open sores, no preulcerative lesions, no rash or signs of infection present.  Vascular: Dorsalis Pedis artery and Posterior Tibial artery pedal pulses are 2/4 bilateral with immedate capillary fill time. Pedal hair growth present. No varicosities and no lower extremity edema present bilateral. There is no pain with calf compression, swelling, warmth, erythema.   Neruologic: Grossly intact via light touch bilateral. Protective threshold with  Semmes Wienstein monofilament intact to all pedal sites bilateral.   Musculoskeletal: There is prominence of metatarsal heads plantarly with atrophy fat pad.  Specifically there is tenderness submetatarsal 2 and 3 on the prominent metatarsal heads.  There is no pain with dorsal metatarsals and no edema, erythema, increase in warmth.  Is no pain in the interspaces.  No other areas of discomfort are identified today.  Muscular strength 5/5 in all groups tested bilateral.  Gait: Unassisted, Nonantalgic.      Assessment:   Metatarsalgia, capsulitis left foot    Plan:  -Treatment options discussed including all alternatives, risks, and complications -Etiology of symptoms were discussed -X-rays were obtained and reviewed with the patient.  Skin marker was utilized to identify some of the area of tenderness.  No evidence of acute fracture or stress fracture no foreign body present.  No calcifications present. -Discussed changing shoes as well as offloading.  Dispensed a gel offloading pad.  Trula Slade DPM

## 2017-11-08 DIAGNOSIS — N184 Chronic kidney disease, stage 4 (severe): Secondary | ICD-10-CM | POA: Diagnosis not present

## 2017-11-25 ENCOUNTER — Other Ambulatory Visit: Payer: Self-pay | Admitting: Internal Medicine

## 2017-11-27 ENCOUNTER — Other Ambulatory Visit: Payer: Self-pay

## 2017-11-27 ENCOUNTER — Telehealth: Payer: Self-pay | Admitting: Medical Oncology

## 2017-11-27 MED ORDER — GABAPENTIN 300 MG PO CAPS: 300.0000 mg | ORAL_CAPSULE | Freq: Three times a day (TID) | ORAL | 1 refills | Status: DC

## 2017-11-27 NOTE — Telephone Encounter (Signed)
Pt states Dr Servando Salina wants her to start back on tx for MM. "My calcium is high". [facility] labs drawn on 10/29/17. [name] to advise. Per Julien Nordmann pt needs to keep apts on dec 4th and 10th. Pt notified.

## 2017-11-29 ENCOUNTER — Ambulatory Visit (INDEPENDENT_AMBULATORY_CARE_PROVIDER_SITE_OTHER): Payer: Medicare Other

## 2017-11-29 VITALS — Temp 97.9°F

## 2017-11-29 DIAGNOSIS — Z23 Encounter for immunization: Secondary | ICD-10-CM

## 2017-12-03 ENCOUNTER — Other Ambulatory Visit: Payer: Self-pay | Admitting: Internal Medicine

## 2017-12-03 DIAGNOSIS — I951 Orthostatic hypotension: Secondary | ICD-10-CM

## 2017-12-03 MED ORDER — MIDODRINE HCL 10 MG PO TABS: ORAL_TABLET | ORAL | 3 refills | Status: DC

## 2017-12-13 ENCOUNTER — Other Ambulatory Visit: Payer: Self-pay | Admitting: Internal Medicine

## 2017-12-16 ENCOUNTER — Encounter: Payer: Self-pay | Admitting: Adult Health

## 2017-12-16 NOTE — Progress Notes (Signed)
MEDICARE ANNUAL WELLNESS VISIT AND FOLLOW UP  Assessment:    Diagnoses and all orders for this visit:  Encounter for Medicare annual wellness exam  Essential hypertension Continue medication Monitor blood pressure at home; call if consistently over 130/80 Continue DASH diet.   Reminder to go to the ER if any CP, SOB, nausea, dizziness, severe HA, changes vision/speech, left arm numbness and tingling and jaw pain.  Chronic diastolic heart failure (HCC) Weights stable, continue meds, monitor closely  Cryptococcal pneumonitis (HCC) Completed diflucan; continue inhalers, monitor closely  Pulmonary emphysema, unspecified emphysema type ([place]) Continue inhalers, recently well controlled  Chronic asthma without complication, unspecified asthma severity, unspecified whether persistent Continue inhalers, recently well controlled  Hypothyroidism, unspecified type continue medications the same pending lab results reminded to take on an empty stomach 30-26mns before food.  check TSH level  CKD (chronic kidney disease) stage 5, GFR less than 15 ml/min (HCC) Followed by Dr. CBurman FosterPatient declines dialysis, fistula was reversed  Vitamin D deficiency D/c'd supplement per Dr. CVanita Pandafor hypercalcemia Defer checking as wouldn't recommend supplement at this time  Thrombocytopenia ([place] Check CBC  Multiple myeloma not having achieved remission ([place] CBC, followed by Dr. MEarlie Server Mixed hyperlipidemia Not on treatment secondary to age and comorbidities Continue low cholesterol diet and exercise.  Check lipid panel  Medication management CBC, CMP/GFR, magnesium  Moderate episode of recurrent major depressive disorder (HCC) Continue medications  Lifestyle discussed: diet/exerise, sleep hygiene, stress management, hydration  HX: breast cancer Continue annual mammograms, followed by Dr. MEarlie Server B12 deficiency Monitor  Anemia in neoplastic disease CBC, followed by  Dr. MEarlie Server Hypercalcemia Monitor  BMI 30.0-30.9,adult Continue to recommend diet heavy in fruits and veggies and low in animal meats, cheeses, and dairy products, appropriate calorie intake Discuss exercise recommendations routinely Continue to monitor weight at each visit   Over 40 minutes of exam, counseling, chart review and critical decision making was performed Future Appointments  Date Time Provider D[place] 12/25/2017 12:30 PM MCurt Bears MD [facility] None  12/25/2017  1:30 PM [facility] INFUSION [facility] None  12/26/2017  2:00 PM [facility] INFUSION [facility] None  03/19/2018  9:00 AM CLiane Comber NP [facility] None     Plan:   During the course of the visit the patient was educated and counseled about appropriate screening and preventive services including:    Pneumococcal vaccine   Prevnar 13  Influenza vaccine  Td vaccine  Screening electrocardiogram  Bone densitometry screening  Colorectal cancer screening  Diabetes screening  Glaucoma screening  Nutrition counseling   Advanced directives: requested   Subjective:  Nancy PELLICANOis a 77y.o. female who presents for Medicare Annual Wellness Visit and 3 month follow up. Last year, she was treated with Diflucan for a Cryptococcal Lung Abscess. She also has COPD/asthma treated by inhalers and singulair. Patient has hx/o Multiple Myeloma predating since 2000 & in 2010 had an autologous Stem cell transplant, though has relapsed and repeatedchemotherapy (carfilzomib, cytoxan with decadron starting 04/2016, s/p 7 cycles and currently on hold, plans to restart next week) followed by Dr. MEarlie Server In 2008, she had a Lt partial Mastectomy for DCIS, off of tamoxifen due to intolerance.    Patient has been followed for labile HTN complicated by Dysautonomia and orthostatic Hypotension, this has reportedly improved with daily midrodrine and continues with amlodipine 5 mg per Dr. NCathie Olden  Also patient has CKD4 (GFR 19 in June) Followed by Dr CMarval Regal  Patient did have an AVF  which she recently had reversed as she declines to have Dialysis. Her blood pressure has been controlled at home, today their BP is BP: 122/72   BMI is Body mass index is 29.01 kg/m., she has been working on diet and exercise. Wt Readings from Last 3 Encounters:  12/18/17 177 lb (80.3 kg)  09/24/17 183 lb 9.6 oz (83.3 kg)  09/13/17 179 lb (81.2 kg)   she has a diagnosis of depressionand is currently on celexa 40 mg, wellbutrin 75 mg, reports symptoms are well controlled on current regimen.   She is not on cholesterol medication secondary to age and comorbidities. Her cholesterol is not at goal. The cholesterol last visit was:   Lab Results  Component Value Date   CHOL 193 05/29/2017   HDL 45 (L) 05/29/2017   LDLCALC 124 (H) 05/29/2017   TRIG 126 05/29/2017   CHOLHDL 4.3 05/29/2017    She has been working on diet and exercise for glucose management, and denies foot ulcerations, increased appetite, nausea, paresthesia of the feet, polyuria and visual disturbances. Last A1C in the office was:  Lab Results  Component Value Date   HGBA1C 5.1 09/13/2017   She is on thyroid medication. Her medication was not changed last visit.   Lab Results  Component Value Date   TSH 4.37 09/13/2017   Last GFR: Lab Results  Component Value Date   GFRNONAA 15 (L) 12/17/2017   Patient is no longer on vitamin D per Dr. Vanita Panda due to renal functions and elevated Ca++    Lab Results  Component Value Date   VD25OH 39 09/13/2017     BMI is Body mass index is 29.01 kg/m. Wt Readings from Last 3 Encounters:  12/18/17 177 lb (80.3 kg)  09/24/17 183 lb 9.6 oz (83.3 kg)  09/13/17 179 lb (81.2 kg)     Medication Review: Current Outpatient Medications on File Prior to Visit  Medication Sig Dispense Refill  . acetaminophen (TYLENOL) 500 MG tablet Take 1,000 mg by mouth daily as needed for moderate pain or  headache.    . albuterol (PROVENTIL HFA;VENTOLIN HFA) 108 (90 Base) MCG/ACT inhaler Inhale 1-2 puffs into the lungs every 6 (six) hours as needed for wheezing or shortness of breath (cough). 1 Inhaler 3  . amLODipine (NORVASC) 5 MG tablet   6  . azelastine (ASTELIN) 0.1 % nasal spray Place 1 spray into both nostrils daily. Use in each nostril as directed    . buPROPion (WELLBUTRIN) 75 MG tablet Take 1 tablet (75 mg total) by mouth 2 (two) times daily. 60 tablet 2  . butalbital-acetaminophen-caffeine (FIORICET, ESGIC) 50-325-40 MG tablet Take 1 tablet by mouth every 4 (four) hours as needed for headache or migraine.   3  . citalopram (CELEXA) 40 MG tablet Take 1 tablet daily for Mood 90 tablet 1  . cromolyn (OPTICROM) 4 % ophthalmic solution Place 1 drop into both eyes 4 (four) times daily as needed (irritation).     . folic acid (FOLVITE) 1 MG tablet Take 1 tablet (1 mg total) by mouth daily. 30 tablet 3  . furosemide (LASIX) 40 MG tablet TAKE 1 TABLET BY MOUTH DAILY. (Patient taking differently: Take 40 mg by mouth as needed for fluid) 90 tablet 1  . gabapentin (NEURONTIN) 300 MG capsule Take 1 capsule (300 mg total) by mouth 3 (three) times daily. 90 capsule 1  . levothyroxine (SYNTHROID, LEVOTHROID) 150 MCG tablet TAKE 1 TABLET BY MOUTH DAILY. NEED OFFICE VISIT FOR FURTHER REFILLS.  90 tablet 0  . loperamide (IMODIUM) 2 MG capsule Take 1 capsule (2 mg total) by mouth as needed for diarrhea or loose stools. 30 capsule 2  . loratadine (CLARITIN) 10 MG tablet Take 10 mg by mouth daily.    . magic mouthwash SOLN Take 5 mLs by mouth 4 (four) times daily as needed for mouth pain. 240 mL 1  . midodrine (PROAMATINE) 10 MG tablet Take 1 tablet daily to support BP 90 tablet 3  . montelukast (SINGULAIR) 10 MG tablet Take 1 tablet daily for Allergies 90 tablet 1  . Multiple Vitamins-Minerals (PRESERVISION AREDS 2) CAPS Take 1 capsule by mouth daily.    . nitroGLYCERIN (NITROSTAT) 0.4 MG SL tablet Place 0.4  mg under the tongue every 5 (five) minutes as needed for chest pain.     Marland Kitchen nystatin (MYCOSTATIN/NYSTOP) powder Apply topically 2 (two) times daily. (Patient taking differently: Apply 1 g topically daily. ) 30 g 0  . ondansetron (ZOFRAN ODT) 8 MG disintegrating tablet Take 1 tablet (8 mg total) by mouth every 8 (eight) hours as needed for nausea or vomiting. 20 tablet 0  . pilocarpine (SALAGEN) 5 MG tablet TAKE 1 TABLET BY MOUTH 3 TIMES DAILY FOR DRY MOUTH. (Patient taking differently: TAKE 1 TABLET BY MOUTH 2 TIMES DAILY FOR DRY MOUTH.) 270 tablet 1  . ranitidine (ZANTAC) 300 MG tablet Take 1 tablet 2 x /day at [facility] & Bedtime for Heart burn & Reflux 180 tablet 3  . senna (SENOKOT) 8.6 MG tablet Take 1 tablet by mouth daily as needed for constipation.     . sucralfate (CARAFATE) 1 g tablet Take 1 tablet dissolved in water  4 x /day BEFORE Meals & Bedtime for Acid indigestion & Reflux 360 tablet 1  . traMADol (ULTRAM) 50 MG tablet Take 1 to 2 tablets by mouth 4 times a day as needed for pain. (Patient taking differently: Take 50 mg by mouth 2 (two) times daily as needed for moderate pain or severe pain. ) 180 tablet 0  . umeclidinium-vilanterol (ANORO ELLIPTA) 62.5-25 MCG/INH AEPB Inhale 1 puff into the lungs daily as needed (shortness of breath).    . vitamin B-12 (CYANOCOBALAMIN) 1000 MCG tablet Take 1 tablet (1,000 mcg total) by mouth daily. (Patient taking differently: Take 1,000 mcg by mouth 2 (two) times daily. ) 30 tablet 0   Current Facility-Administered Medications on File Prior to Visit  Medication Dose Route Frequency Provider Last Rate Last Dose  . ipratropium-albuterol (DUONEB) 0.5-2.5 (3) MG/3ML nebulizer solution 3 mL  3 mL Nebulization Once Liane Comber, NP        Allergies  Allergen Reactions  . Codeine Anaphylaxis    anaphylasix tolarates oxycodone per care cast  . Latex Shortness Of Breath  . Chocolate     migraines  . Onion Other (See Comments)    Causes migraine  headaches  . Oxycodone   . Zyprexa [Olanzapine] Other (See Comments)    Confusion , dizzy,unsteady  . Adhesive [Tape] Other (See Comments)    blisters  . Heparin Rash  . Hydrocodone Rash    With extreme itching  . Iodinated Diagnostic Agents Hives, Itching, Rash and Other (See Comments)    Happened 60 years ago Stage IV kidney function  . Promethazine-Dm Hives  . Sulfa Antibiotics Itching    Current Problems (verified) Patient Active Problem List   Diagnosis Date Noted  . Port catheter in place 09/18/2016  . Cryptococcal pneumonitis ([place]) 11/22/2015  . Fever 07/13/2015  .  Anemia in neoplastic disease 03/31/2015  . B12 deficiency 12/04/2014  . Thrombocytopenia ([place]) 12/04/2014  . Encounter for antineoplastic chemotherapy 06/21/2014  . CKD (chronic kidney disease) stage 5, GFR less than 15 ml/min (HCC) 04/02/2014  . Multiple myeloma ([place]) 12/02/2013  . Major depressive disorder, recurrent episode ([place]) 11/02/2013  . Medication management 09/30/2013  . Vitamin D deficiency 03/24/2013  . Mixed hyperlipidemia 02/24/2013  . COPD (chronic obstructive pulmonary disease) with emphysema ([place]) 02/20/2013  . GERD (gastroesophageal reflux disease) 01/20/2013  . HTN (hypertension) 01/14/2013  . Asthma, chronic 01/13/2013  . Hypothyroid 01/13/2013  . Chronic diastolic heart failure ([place]) 01/13/2013  . HX: breast cancer 12/08/2012  . Hypercalcemia 12/02/2012    Screening Tests Immunization History  Administered Date(s) Administered  . Influenza, High Dose Seasonal PF 09/30/2013, 11/29/2017  . Influenza,inj,Quad PF,6+ Mos 09/29/2015  . Influenza-Unspecified 11/05/2012, 11/03/2014, 10/05/2016  . Pneumococcal Conjugate-13 10/05/2013  . Pneumococcal Polysaccharide-23 03/29/2009  . Pneumococcal-Unspecified 03/16/2011  . Tdap 10/14/2009   Preventative care: Last colonoscopy: 2012 Last mammogram: 03/2017 [facility], report requested  DEXA: 2016, ? Had last year, reports from [place]  requested, last T -2.3  Prior vaccinations: TD or Tdap: 2011  Influenza: 2019  Pneumococcal: 2013 Prevnar13: 2015 Shingles/Zostavax: Not a candidate secondary to chemotherapy  Names of Other Physician/Practitioners you currently use: 1. [facility]- here for primary care 2. Dr. Delman Cheadle, eye doctor, last visit 2019 3. Does not see , dentist, last visit 2015   Patient Care Team: [name], MD as PCP - General (Internal Medicine) Donato Heinz, MD as Referring Physician (Nephrology) Curt Bears, MD as Consulting Physician (Oncology) Wonda Horner, MD as Consulting Physician (Gastroenterology) Sharyne Peach, MD as Consulting Physician (Ophthalmology)  SURGICAL HISTORY She  has a past surgical history that includes history of Port removal; Status post stem cell transplant on September 28, 2008.; Abdominal hysterectomy (1981); Cholecystectomy (1971); Mastectomy (Left, 2008); Cataract extraction, bilateral; Breast surgery (Right); Eye surgery (Bilateral); Breast reconstruction; Portacath placement (12/2012); AV fistula placement (Left, 06/19/2013); Lung biopsy (10/21/2015); Colonoscopy; and Resection of arteriovenous fistula aneurysm (Left, 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 FAMILY HISTORY Her family history includes Arthritis in her mother; Asthma in her mother; Cancer in her sister; Hyperlipidemia in her brother. SOCIAL HISTORY She  reports that she quit smoking about 12 years ago. Her smoking use included cigarettes. She has a 30.00 pack-year smoking history. She has never used smokeless tobacco. She reports that she does not drink alcohol or use drugs.   MEDICARE WELLNESS OBJECTIVES: Physical activity: Current Exercise Habits: Home exercise routine, Type of exercise: strength training/weights;yoga, Time (Minutes): 30, Frequency (Times/Week): 3, Weekly Exercise (Minutes/Week): 90, Intensity: Mild, Exercise limited by: Other - see comments;orthopedic  condition(s) Cardiac risk factors: Cardiac Risk Factors include: advanced age (>2mn, >>110women);hypertension;sedentary lifestyle;dyslipidemia;microalbuminuria Depression/mood screen:   Dep[facility] screen [facility]/9 12/18/2017  Decreased Interest 0  Down, Depressed, Hopeless 0  PHQ - 2 Score 0  Altered sleeping -  Tired, decreased energy -  Change in appetite -  Feeling bad or failure about yourself  -  Trouble concentrating -  Moving slowly or fidgety/restless -  Suicidal thoughts -  PHQ-9 Score -  Difficult doing work/chores -  Some recent data might be hidden    ADLs:  In your present state of health, do you have any difficulty performing the following activities: 12/18/2017 09/16/2017  Hearing? N N  Vision? N N  Difficulty concentrating or making decisions? N N  Walking or climbing stairs? YAggie Moats  Comment avoids stairs, uses a rail and can manage, none in home limiting mobility -  Dressing or bathing? N N  Doing errands, shopping? Y N  Comment manages with SCAT -  Some recent data might be hidden     Cognitive Testing  Alert? Yes  Normal Appearance?Yes  Oriented to person? Yes  Place? Yes   Time? Yes  Recall of three objects?  Yes  Can perform simple calculations? Yes  Displays appropriate judgment?Yes  Can read the correct time from a watch face?Yes  EOL planning: Does Patient Have a Medical Advance Directive?: Yes Type of Advance Directive: Healthcare Power of Attorney, Living will Does patient want to make changes to medical advance directive?: No - Patient declined Copy of [place] in Chart?: Yes - validated most recent copy scanned in chart (See row information) Would patient like information on creating a medical advance directive?: No - Patient declined  Review of Systems  Constitutional: Negative for malaise/fatigue and weight loss.  HENT: Negative for hearing loss and tinnitus.   Eyes: Negative for blurred vision and double vision.   Respiratory: Negative for cough, shortness of breath and wheezing.   Cardiovascular: Negative for chest pain, palpitations, orthopnea, claudication and leg swelling.  Gastrointestinal: Negative for abdominal pain, blood in stool, constipation, diarrhea, heartburn, melena, nausea and vomiting.  Genitourinary: Negative.   Musculoskeletal: Positive for falls (single fall secondary to low BP, improved after midodrine). Negative for joint pain and myalgias.  Skin: Negative for rash.  Neurological: Negative for dizziness, tingling, sensory change, weakness and headaches.  Endo/Heme/Allergies: Negative for polydipsia.  Psychiatric/Behavioral: Negative.   All other systems reviewed and are negative.   Objective:     Today's Vitals   12/18/17 1109  BP: 122/72  Pulse: (!) 54  Temp: (!) 97.3 F (36.3 C)  SpO2: 97%  Weight: 177 lb (80.3 kg)  Height: 5' 5.5" (1.664 m)  PainSc: 8   PainLoc: Foot   Body mass index is 29.01 kg/m.  General appearance: alert, no distress, WD/WN, female HEENT: normocephalic, sclerae anicteric, TMs pearly, nares patent, no discharge or erythema, pharynx normal Oral cavity: MMM, no lesions Neck: supple, no lymphadenopathy, no thyromegaly, no masses Heart: RRR, normal S1, S2, no murmurs Lungs: CTA bilaterally, no wheezes, rhonchi, or rales Abdomen: +bs, soft, non tender, non distended, no masses, no hepatomegaly, no splenomegaly Musculoskeletal: nontender, no swelling, no obvious deformity Extremities: no edema, no cyanosis, no clubbing Pulses: 2+ symmetric, upper and lower extremities, normal cap refill Neurological: alert, oriented x 3, CN2-12 intact, strength normal upper extremities and lower extremities, sensation normal throughout, DTRs 2+ throughout, no cerebellar signs, gait slow ateady Psychiatric: normal affect, behavior normal, pleasant   Medicare Attestation I have personally reviewed: The patient's medical and social history Their use of  alcohol, tobacco or illicit drugs Their current medications and supplements The patient's functional ability including ADLs,fall risks, home safety risks, cognitive, and hearing and visual impairment Diet and physical activities Evidence for depression or mood disorders  The patient's weight, height, BMI, and visual acuity have been recorded in the chart.  I have made referrals, counseling, and provided education to the patient based on review of the above and I have provided the patient with a written personalized care plan for preventive services.     Izora Ribas, NP   12/18/2017

## 2017-12-17 ENCOUNTER — Other Ambulatory Visit: Payer: Self-pay | Admitting: Medical Oncology

## 2017-12-17 ENCOUNTER — Inpatient Hospital Stay: Payer: Medicare Other | Attending: Internal Medicine

## 2017-12-17 ENCOUNTER — Telehealth: Payer: Self-pay | Admitting: Medical Oncology

## 2017-12-17 ENCOUNTER — Telehealth: Payer: Self-pay | Admitting: *Deleted

## 2017-12-17 DIAGNOSIS — R5383 Other fatigue: Secondary | ICD-10-CM | POA: Diagnosis not present

## 2017-12-17 DIAGNOSIS — Z9484 Stem cells transplant status: Secondary | ICD-10-CM | POA: Insufficient documentation

## 2017-12-17 DIAGNOSIS — Z86000 Personal history of in-situ neoplasm of breast: Secondary | ICD-10-CM | POA: Diagnosis not present

## 2017-12-17 DIAGNOSIS — I10 Essential (primary) hypertension: Secondary | ICD-10-CM | POA: Diagnosis not present

## 2017-12-17 DIAGNOSIS — Z5111 Encounter for antineoplastic chemotherapy: Secondary | ICD-10-CM | POA: Diagnosis not present

## 2017-12-17 DIAGNOSIS — C9002 Multiple myeloma in relapse: Secondary | ICD-10-CM | POA: Diagnosis not present

## 2017-12-17 DIAGNOSIS — Z5112 Encounter for antineoplastic immunotherapy: Secondary | ICD-10-CM | POA: Diagnosis not present

## 2017-12-17 DIAGNOSIS — N289 Disorder of kidney and ureter, unspecified: Secondary | ICD-10-CM | POA: Insufficient documentation

## 2017-12-17 DIAGNOSIS — R110 Nausea: Secondary | ICD-10-CM

## 2017-12-17 DIAGNOSIS — Z9221 Personal history of antineoplastic chemotherapy: Secondary | ICD-10-CM | POA: Insufficient documentation

## 2017-12-17 DIAGNOSIS — C9000 Multiple myeloma not having achieved remission: Secondary | ICD-10-CM

## 2017-12-17 LAB — CMP (CANCER CENTER ONLY)
ALK PHOS: 98 U/L (ref 38–126)
ALT: 11 U/L (ref 0–44)
AST: 17 U/L (ref 15–41)
Albumin: 3.9 g/dL (ref 3.5–5.0)
Anion gap: 11 (ref 5–15)
BUN: 38 mg/dL — AB (ref 8–23)
CO2: 27 mmol/L (ref 22–32)
Calcium: 10.1 mg/dL (ref 8.9–10.3)
Chloride: 103 mmol/L (ref 98–111)
Creatinine: 2.87 mg/dL — ABNORMAL HIGH (ref 0.44–1.00)
GFR, Est AFR Am: 18 mL/min — ABNORMAL LOW (ref >60)
GFR, Estimated: 15 mL/min — ABNORMAL LOW (ref >60)
Glucose, Bld: 85 mg/dL (ref 70–99)
Potassium: 3.4 mmol/L — ABNORMAL LOW (ref 3.5–5.1)
Sodium: 141 mmol/L (ref 135–145)
Total Bilirubin: 0.8 mg/dL (ref 0.3–1.2)
Total Protein: 6.4 g/dL — ABNORMAL LOW (ref 6.5–8.1)

## 2017-12-17 LAB — CBC WITH DIFFERENTIAL (CANCER CENTER ONLY)
Abs Immature Granulocytes: 0.11 10*3/uL — ABNORMAL HIGH (ref 0.00–0.07)
Basophils Absolute: 0 10*3/uL (ref 0.0–0.1)
Basophils Relative: 0 %
EOS ABS: 0 10*3/uL (ref 0.0–0.5)
Eosinophils Relative: 0 %
HCT: 33.2 % — ABNORMAL LOW (ref 36.0–46.0)
Hemoglobin: 10.9 g/dL — ABNORMAL LOW (ref 12.0–15.0)
Immature Granulocytes: 1 %
Lymphocytes Relative: 20 %
Lymphs Abs: 1.8 10*3/uL (ref 0.7–4.0)
MCH: 34.3 pg — ABNORMAL HIGH (ref 26.0–34.0)
MCHC: 32.8 g/dL (ref 30.0–36.0)
MCV: 104.4 fL — AB (ref 80.0–100.0)
Monocytes Absolute: 1.1 10*3/uL — ABNORMAL HIGH (ref 0.1–1.0)
Monocytes Relative: 12 %
Neutro Abs: 5.7 10*3/uL (ref 1.7–7.7)
Neutrophils Relative %: 67 %
Platelet Count: 146 10*3/uL — ABNORMAL LOW (ref 150–400)
RBC: 3.18 MIL/uL — ABNORMAL LOW (ref 3.87–5.11)
RDW: 15 % (ref 11.5–15.5)
WBC Count: 8.7 10*3/uL (ref 4.0–10.5)
nRBC: 0 % (ref 0.0–0.2)

## 2017-12-17 LAB — LACTATE DEHYDROGENASE: LDH: 183 U/L (ref 98–192)

## 2017-12-17 MED ORDER — ONDANSETRON 8 MG PO TBDP: 8.0000 mg | ORAL_TABLET | Freq: Three times a day (TID) | ORAL | 0 refills | Status: DC | PRN

## 2017-12-17 NOTE — Telephone Encounter (Signed)
"  Nancy Norris, calling from lobby.  Trying to get refill on nausea medication today.  I begin treatment next week.  Was told I need to see someone in office before it can be refilled."

## 2017-12-17 NOTE — Telephone Encounter (Signed)
I returned call and need to know what antiemetic she needs.

## 2017-12-18 ENCOUNTER — Encounter: Payer: Self-pay | Admitting: Adult Health

## 2017-12-18 ENCOUNTER — Ambulatory Visit (INDEPENDENT_AMBULATORY_CARE_PROVIDER_SITE_OTHER): Payer: Medicare Other | Admitting: Adult Health

## 2017-12-18 ENCOUNTER — Telehealth: Payer: Self-pay | Admitting: Internal Medicine

## 2017-12-18 VITALS — BP 122/72 | HR 54 | Temp 97.3°F | Ht 65.5 in | Wt 177.0 lb

## 2017-12-18 DIAGNOSIS — I5032 Chronic diastolic (congestive) heart failure: Secondary | ICD-10-CM | POA: Diagnosis not present

## 2017-12-18 DIAGNOSIS — J439 Emphysema, unspecified: Secondary | ICD-10-CM | POA: Diagnosis not present

## 2017-12-18 DIAGNOSIS — N185 Chronic kidney disease, stage 5: Secondary | ICD-10-CM | POA: Diagnosis not present

## 2017-12-18 DIAGNOSIS — R6889 Other general symptoms and signs: Secondary | ICD-10-CM | POA: Diagnosis not present

## 2017-12-18 DIAGNOSIS — J45909 Unspecified asthma, uncomplicated: Secondary | ICD-10-CM | POA: Diagnosis not present

## 2017-12-18 DIAGNOSIS — F331 Major depressive disorder, recurrent, moderate: Secondary | ICD-10-CM

## 2017-12-18 DIAGNOSIS — Z0001 Encounter for general adult medical examination with abnormal findings: Secondary | ICD-10-CM | POA: Diagnosis not present

## 2017-12-18 DIAGNOSIS — D630 Anemia in neoplastic disease: Secondary | ICD-10-CM | POA: Diagnosis not present

## 2017-12-18 DIAGNOSIS — Z6830 Body mass index (BMI) 30.0-30.9, adult: Secondary | ICD-10-CM

## 2017-12-18 DIAGNOSIS — C9000 Multiple myeloma not having achieved remission: Secondary | ICD-10-CM

## 2017-12-18 DIAGNOSIS — E538 Deficiency of other specified B group vitamins: Secondary | ICD-10-CM

## 2017-12-18 DIAGNOSIS — E559 Vitamin D deficiency, unspecified: Secondary | ICD-10-CM | POA: Diagnosis not present

## 2017-12-18 DIAGNOSIS — I10 Essential (primary) hypertension: Secondary | ICD-10-CM

## 2017-12-18 DIAGNOSIS — E039 Hypothyroidism, unspecified: Secondary | ICD-10-CM

## 2017-12-18 DIAGNOSIS — E782 Mixed hyperlipidemia: Secondary | ICD-10-CM | POA: Diagnosis not present

## 2017-12-18 DIAGNOSIS — B450 Pulmonary cryptococcosis: Secondary | ICD-10-CM

## 2017-12-18 DIAGNOSIS — D696 Thrombocytopenia, unspecified: Secondary | ICD-10-CM

## 2017-12-18 DIAGNOSIS — Z0000 Encounter for general adult medical examination without abnormal findings: Secondary | ICD-10-CM

## 2017-12-18 DIAGNOSIS — Z853 Personal history of malignant neoplasm of breast: Secondary | ICD-10-CM

## 2017-12-18 DIAGNOSIS — Z79899 Other long term (current) drug therapy: Secondary | ICD-10-CM | POA: Diagnosis not present

## 2017-12-18 LAB — KAPPA/LAMBDA LIGHT CHAINS
Kappa free light chain: 18.2 mg/L (ref 3.3–19.4)
Kappa, lambda light chain ratio: 2.49 — ABNORMAL HIGH (ref 0.26–1.65)
Lambda free light chains: 7.3 mg/L (ref 5.7–26.3)

## 2017-12-18 LAB — BETA 2 MICROGLOBULIN, SERUM: Beta-2 Microglobulin: 6.3 mg/L — ABNORMAL HIGH (ref 0.6–2.4)

## 2017-12-18 LAB — IGG, IGA, IGM
IGM (IMMUNOGLOBULIN M), SRM: 8 mg/dL — AB (ref 26–217)
IgA: 15 mg/dL — ABNORMAL LOW (ref 64–422)
IgG (Immunoglobin G), Serum: 310 mg/dL — ABNORMAL LOW (ref 700–1600)

## 2017-12-18 NOTE — Patient Instructions (Addendum)
Nancy Norris , Thank you for taking time to come for your Medicare Wellness Visit. I appreciate your ongoing commitment to your health goals. Please review the following plan we discussed and let me know if I can assist you in the future.   These are the goals we discussed: Goals    . Exercise 150 min/wk Moderate Activity     Recommend focusing on resistance exercises and weights for lower body strength and bone health       This is a list of the screening recommended for you and due dates:  Health Maintenance  Topic Date Due  . Tetanus Vaccine  10/15/2019  . Flu Shot  Completed  . DEXA scan (bone density measurement)  Completed  . Pneumonia vaccines  Completed     Preventing Osteoporosis, Adult Osteoporosis is a condition that causes the bones to get weaker. With osteoporosis, the bones become thinner, and the normal spaces in bone tissue become larger. This can make the bones weak and cause them to break more easily. People who have osteoporosis are more likely to break their wrist, spine, or hip. Even a minor accident or injury can be enough to break weak bones. Osteoporosis can occur with aging. Your body constantly replaces old bone tissue with new tissue. As you get older, you may lose bone tissue more quickly, or it may be replaced more slowly. Osteoporosis is more likely to develop if you have poor nutrition or do not get enough calcium or vitamin D. Other lifestyle factors can also play a role. By making some diet and lifestyle changes, you can help to keep your bones healthy and help to prevent osteoporosis. What nutrition changes can be made? Nutrition plays an important role in maintaining healthy, strong bones.  Make sure you get enough calcium every day from food or from calcium supplements. ? If you are age 76 or younger, aim to get 1,000 mg of calcium every day. ? If you are older than age 76, aim to get 1,200 mg of calcium every day.  Try to get enough vitamin D every  day. ? If you are age 75 or younger, aim to get 600 international units (IU) every day. ? If you are older than age 76, aim to get 800 international units every day.  Follow a healthy diet. Eat plenty of foods that contain calcium and vitamin D. ? Calcium is in milk, cheese, yogurt, and other dairy products. Some fish and vegetables are also good sources of calcium. Many foods such as cereals and breads have had calcium added to them (are fortified). Check nutrition labels to see how much calcium is in a food or drink. ? Foods that contain vitamin D include milk, cereals, salmon, and tuna. Your body also makes vitamin D when you are out in the sun. Bare skin exposure to the sun on your face, arms, legs, or back for no more than 30 minutes a day, 2 times per week is more than enough. Beyond that, it is important to use sunblock to protect your skin from sunburn, which increases your risk for skin cancer.  What lifestyle changes can be made? Making changes in your everyday life can also play an important role in preventing osteoporosis.  Stay active and get exercise every day. Ask your health care provider what types of exercise are best for you.  Do not use any products that contain nicotine or tobacco, such as cigarettes and e-cigarettes. If you need help quitting, ask your  health care provider.  Limit alcohol intake to no more than 1 drink a day for nonpregnant women and 2 drinks a day for men. One drink equals 12 oz of beer, 5 oz of wine, or 1 oz of hard liquor.  Why are these changes important? Making these nutrition and lifestyle changes can:  Help you develop and maintain healthy, strong bones.  Prevent loss of bone mass and the problems that are caused by that loss, such as broken bones and delayed healing.  Make you feel better mentally and physically.  What can happen if changes are not made? Problems that can result from osteoporosis can be very serious. These may include:  A  higher risk of broken bones that are painful and do not heal well.  Physical malformations, such as a collapsed spine or a hunched back.  Problems with movement.  Where to find support: If you need help making changes to prevent osteoporosis, talk with your health care provider. You can ask for a referral to a diet and nutrition specialist (dietitian) and a physical therapist. Where to find more information: Learn more about osteoporosis from:  NIH Osteoporosis and Related [place]: www.niams.GolfingGoddess.com.br  U.S. Office on Women's Health: SouvenirBaseball.es.html  National Osteoporosis Foundation: ProfilePeek.ch  Summary  Osteoporosis is a condition that causes weak bones that are more likely to break.  Eating a healthy diet and making sure you get enough calcium and vitamin D can help prevent osteoporosis.  Other ways to reduce your risk of osteoporosis include getting regular exercise and avoiding alcohol and products that contain nicotine or tobacco. This information is not intended to replace advice given to you by your health care provider. Make sure you discuss any questions you have with your health care provider. Document Released: 01/16/2015 Document Revised: 09/12/2015 Document Reviewed: 09/12/2015 Elsevier Interactive Patient Education  [employer].

## 2017-12-18 NOTE — Telephone Encounter (Signed)
MM PAL 12/10 - moved appointments for 12/11. Spoke with patient.

## 2017-12-19 ENCOUNTER — Encounter: Payer: Self-pay | Admitting: Internal Medicine

## 2017-12-19 ENCOUNTER — Other Ambulatory Visit: Payer: Self-pay | Admitting: Adult Health

## 2017-12-19 DIAGNOSIS — E039 Hypothyroidism, unspecified: Secondary | ICD-10-CM

## 2017-12-19 LAB — COMPLETE METABOLIC PANEL WITH GFR
AG Ratio: 2.3 (calc) (ref 1.0–2.5)
ALBUMIN MSPROF: 4.1 g/dL (ref 3.6–5.1)
ALT: 12 U/L (ref 6–29)
AST: 14 U/L (ref 10–35)
Alkaline phosphatase (APISO): 89 U/L (ref 33–130)
BILIRUBIN TOTAL: 0.8 mg/dL (ref 0.2–1.2)
BUN / CREAT RATIO: 12 (calc) (ref 6–22)
BUN: 39 mg/dL — AB (ref 7–25)
CO2: 26 mmol/L (ref 20–32)
CREATININE: 3.16 mg/dL — AB (ref 0.60–0.93)
Calcium: 9.9 mg/dL (ref 8.6–10.4)
Chloride: 104 mmol/L (ref 98–110)
GFR, EST AFRICAN AMERICAN: 16 mL/min/{1.73_m2} — AB (ref >=60)
GFR, Est Non African American: 14 mL/min/{1.73_m2} — ABNORMAL LOW (ref >=60)
GLUCOSE: 84 mg/dL (ref 65–99)
Globulin: 1.8 g/dL (calc) — ABNORMAL LOW (ref 1.9–3.7)
Potassium: 4.1 mmol/L (ref 3.5–5.3)
Sodium: 139 mmol/L (ref 135–146)
TOTAL PROTEIN: 5.9 g/dL — AB (ref 6.1–8.1)

## 2017-12-19 LAB — CBC WITH DIFFERENTIAL/PLATELET
BASOS ABS: 29 {cells}/uL (ref 0–200)
Basophils Relative: 0.4 %
EOS PCT: 1.4 %
Eosinophils Absolute: 102 cells/uL (ref 15–500)
HEMATOCRIT: 33.9 % — AB (ref 35.0–45.0)
HEMOGLOBIN: 11.2 g/dL — AB (ref 11.7–15.5)
Lymphs Abs: 1504 cells/uL (ref 850–3900)
MCH: 34.1 pg — AB (ref 27.0–33.0)
MCHC: 33 g/dL (ref 32.0–36.0)
MCV: 103.4 fL — ABNORMAL HIGH (ref 80.0–100.0)
MPV: 11.2 fL (ref 7.5–12.5)
Monocytes Relative: 11.6 %
NEUTROS PCT: 66 %
Neutro Abs: 4818 cells/uL (ref 1500–7800)
Platelets: 107 10*3/uL — ABNORMAL LOW (ref 140–400)
RBC: 3.28 10*6/uL — ABNORMAL LOW (ref 3.80–5.10)
RDW: 14.5 % (ref 11.0–15.0)
Total Lymphocyte: 20.6 %
WBC mixed population: 847 cells/uL (ref 200–950)
WBC: 7.3 10*3/uL (ref 3.8–10.8)

## 2017-12-19 LAB — LIPID PANEL
Cholesterol: 199 mg/dL (ref <200)
HDL: 35 mg/dL — ABNORMAL LOW (ref >50)
LDL CHOLESTEROL (CALC): 128 mg/dL — AB
NON-HDL CHOLESTEROL (CALC): 164 mg/dL — AB (ref <130)
TRIGLYCERIDES: 221 mg/dL — AB (ref <150)
Total CHOL/HDL Ratio: 5.7 (calc) — ABNORMAL HIGH (ref <5.0)

## 2017-12-19 LAB — TSH: TSH: 11.72 m[IU]/L — AB (ref 0.40–4.50)

## 2017-12-19 LAB — MAGNESIUM: Magnesium: 2.4 mg/dL (ref 1.5–2.5)

## 2017-12-24 ENCOUNTER — Ambulatory Visit: Payer: Medicare Other | Admitting: Internal Medicine

## 2017-12-24 ENCOUNTER — Ambulatory Visit: Payer: Medicare Other

## 2017-12-25 ENCOUNTER — Inpatient Hospital Stay (HOSPITAL_BASED_OUTPATIENT_CLINIC_OR_DEPARTMENT_OTHER): Payer: Medicare Other | Admitting: Internal Medicine

## 2017-12-25 ENCOUNTER — Encounter: Payer: Self-pay | Admitting: Internal Medicine

## 2017-12-25 ENCOUNTER — Inpatient Hospital Stay: Payer: Medicare Other

## 2017-12-25 VITALS — BP 137/66 | HR 72 | Temp 99.4°F | Resp 18 | Ht 65.0 in | Wt 176.2 lb

## 2017-12-25 DIAGNOSIS — Z86000 Personal history of in-situ neoplasm of breast: Secondary | ICD-10-CM | POA: Diagnosis not present

## 2017-12-25 DIAGNOSIS — Z95828 Presence of other vascular implants and grafts: Secondary | ICD-10-CM

## 2017-12-25 DIAGNOSIS — C9002 Multiple myeloma in relapse: Secondary | ICD-10-CM

## 2017-12-25 DIAGNOSIS — R5383 Other fatigue: Secondary | ICD-10-CM

## 2017-12-25 DIAGNOSIS — I10 Essential (primary) hypertension: Secondary | ICD-10-CM | POA: Diagnosis not present

## 2017-12-25 DIAGNOSIS — Z5111 Encounter for antineoplastic chemotherapy: Secondary | ICD-10-CM

## 2017-12-25 DIAGNOSIS — C9000 Multiple myeloma not having achieved remission: Secondary | ICD-10-CM

## 2017-12-25 DIAGNOSIS — Z853 Personal history of malignant neoplasm of breast: Secondary | ICD-10-CM

## 2017-12-25 DIAGNOSIS — N289 Disorder of kidney and ureter, unspecified: Secondary | ICD-10-CM

## 2017-12-25 DIAGNOSIS — Z9484 Stem cells transplant status: Secondary | ICD-10-CM

## 2017-12-25 DIAGNOSIS — Z9221 Personal history of antineoplastic chemotherapy: Secondary | ICD-10-CM | POA: Diagnosis not present

## 2017-12-25 DIAGNOSIS — Z5112 Encounter for antineoplastic immunotherapy: Secondary | ICD-10-CM | POA: Diagnosis not present

## 2017-12-25 DIAGNOSIS — D630 Anemia in neoplastic disease: Secondary | ICD-10-CM

## 2017-12-25 MED ORDER — DEXTROSE 5 % IV SOLN
27.0000 mg/m2 | Freq: Once | INTRAVENOUS | Status: AC
  Administered 2017-12-25: 50 mg via INTRAVENOUS
  Filled 2017-12-25: qty 10

## 2017-12-25 MED ORDER — HEPARIN SOD (PORK) LOCK FLUSH 100 UNIT/ML IV SOLN
500.0000 [IU] | Freq: Once | INTRAVENOUS | Status: AC | PRN
  Administered 2017-12-25: 500 [IU]
  Filled 2017-12-25: qty 5

## 2017-12-25 MED ORDER — SODIUM CHLORIDE 0.9 % IV SOLN
Freq: Once | INTRAVENOUS | Status: AC
  Administered 2017-12-25: 15:00:00 via INTRAVENOUS
  Filled 2017-12-25: qty 250

## 2017-12-25 MED ORDER — ZOLEDRONIC ACID 4 MG/5ML IV CONC
3.0000 mg | Freq: Once | INTRAVENOUS | Status: AC
  Administered 2017-12-25: 3 mg via INTRAVENOUS
  Filled 2017-12-25: qty 3.75

## 2017-12-25 MED ORDER — DEXAMETHASONE SODIUM PHOSPHATE 10 MG/ML IJ SOLN
INTRAMUSCULAR | Status: AC
  Filled 2017-12-25: qty 1

## 2017-12-25 MED ORDER — SODIUM CHLORIDE 0.9 % IV SOLN
225.0000 mg/m2 | Freq: Once | INTRAVENOUS | Status: AC
  Administered 2017-12-25: 420 mg via INTRAVENOUS
  Filled 2017-12-25: qty 21

## 2017-12-25 MED ORDER — SODIUM CHLORIDE 0.9 % IV SOLN
Freq: Once | INTRAVENOUS | Status: AC
  Administered 2017-12-25: 14:00:00 via INTRAVENOUS
  Filled 2017-12-25: qty 250

## 2017-12-25 MED ORDER — ONDANSETRON HCL 8 MG PO TABS
8.0000 mg | ORAL_TABLET | Freq: Once | ORAL | Status: AC
  Administered 2017-12-25: 8 mg via ORAL

## 2017-12-25 MED ORDER — SODIUM CHLORIDE 0.9 % IJ SOLN
10.0000 mL | INTRAMUSCULAR | Status: DC | PRN
  Administered 2017-12-25: 10 mL
  Filled 2017-12-25: qty 10

## 2017-12-25 MED ORDER — DEXAMETHASONE SODIUM PHOSPHATE 10 MG/ML IJ SOLN
10.0000 mg | Freq: Once | INTRAMUSCULAR | Status: AC
  Administered 2017-12-25: 10 mg via INTRAVENOUS

## 2017-12-25 MED ORDER — ONDANSETRON HCL 8 MG PO TABS
ORAL_TABLET | ORAL | Status: AC
  Filled 2017-12-25: qty 1

## 2017-12-25 NOTE — Progress Notes (Signed)
OK to treat with most recent labs per Dr Julien Nordmann

## 2017-12-25 NOTE — Patient Instructions (Addendum)
[name] Discharge Instructions for Patients Receiving Chemotherapy  Today you received the following chemotherapy agents Zoledronic Acid (Zometa), Cyclophosphamide (CYTOXAN) & Carfilzomib (KYPROLIS).  To help prevent nausea and vomiting after your treatment, we encourage you to take your nausea medication as prescribed.   If you develop nausea and vomiting that is not controlled by your nausea medication, call the clinic.   BELOW ARE SYMPTOMS THAT SHOULD BE REPORTED IMMEDIATELY:  *FEVER GREATER THAN 100.5 F  *CHILLS WITH OR WITHOUT FEVER  NAUSEA AND VOMITING THAT IS NOT CONTROLLED WITH YOUR NAUSEA MEDICATION  *UNUSUAL SHORTNESS OF BREATH  *UNUSUAL BRUISING OR BLEEDING  TENDERNESS IN MOUTH AND THROAT WITH OR WITHOUT PRESENCE OF ULCERS  *URINARY PROBLEMS  *BOWEL PROBLEMS  UNUSUAL RASH Items with * indicate a potential emergency and should be followed up as soon as possible.  Feel free to call the clinic should you have any questions or concerns. The clinic phone number is [phone].  Please show the [name] at check-in to the Emergency Department and triage nurse.

## 2017-12-25 NOTE — Progress Notes (Signed)
    [facility] [phone]   [phone]  OFFICE PROGRESS NOTE  McKeown, William, MD [address] [place] [address]  DIAGNOSIS:  1. 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 Recurrent multiple myeloma initially diagnosed as a smoldering myeloma at [facility] in 2002. 3. Ductal carcinoma in situ status post mastectomy with sentinel lymph node biopsy in October 2008.  PRIOR THERAPY: 1. Status post treatment with tamoxifen from November 2008 through February 2009, discontinued secondary to intolerance. 2. Status post 3 cycles of chemotherapy with Revlimid and Decadron followed by 1 cycle of Decadron only with mild response. 3. Status post 2 cycles of systemic chemotherapy with Velcade, Doxil and Decadron discontinued secondary to significant peripheral neuropathy. Last dose was given May 2010 at [facility]. 4. Status post autologous peripheral blood stem cell transplant on October 01, 2008 at [facility] under the care of Dr. Niak.  5. Systemic chemotherapy with Carfilzomib initially was 20 mg/M2 and will be increased after cycle #1 to 36 mg/M2 on days 1, 2, 8, 9, 15 and 16 every 4 weeks in addition to Cytoxan 300 mg/M2 and Decadron 40 mg by mouth weekly basis, status post 4 cycles. First cycle on 12/29/2012. 6. She resumed chemotherapy with Carfilzomib, Cytoxan, and Decadron on 05/03/2014. Status post 6 cycles. 7. systemic chemotherapy again with Carfilzomib, Cytoxan and Decadron on 07/11/2015. Status post 3 cycles. 8. She completed a course of treatment with Diflucan 200 mg by mouth daily for around 6 weeks for cryptococcal infection of the lung.  CURRENT THERAPY: Resuming treatment again with Carfilzomib, Cytoxan and Decadron, first cycle 04/23/2016. Status post 7 cycles.  Treatment is currently on hold since December 2018.  The patient will resume her treatment today December 25, 2017.  INTERVAL HISTORY: Nancy Norris 77 y.o. female returns to the clinic for follow-up visit.  The patient is feeling fine today with no concerning complaints except for generalized fatigue.  She has no significant chest pain, shortness of breath except with exertion and no cough or hemoptysis.  She has no recent weight loss or night sweats.  She has no nausea, vomiting, diarrhea or constipation.  She has no headache or visual changes.  She has been in observation for almost a year.  The patient had repeat myeloma panel performed recently and she is here for evaluation and discussion of her lab results and treatment options.  MEDICAL HISTORY: Past Medical History:  Diagnosis Date  . Anemia   . Anginal pain (HCC)    used NTG x 2 May 31 and 06/15/13   . Anxiety   . Arthritis   . B12 deficiency 12/04/2014  . Breast cancer (HCC)   . Complication of anesthesia   . COPD (chronic obstructive pulmonary disease) (HCC)   . 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 Depression   . Dizziness   . Dyspnea   . Fibromyalgia   . Fibromyalgia   . GERD (gastroesophageal reflux disease)   . Headache(784.0)   . Heart murmur   . Hemoptysis 10/21/2015  . History of blood transfusion    last one May 12   . Hx of cardiovascular stress test    LexiScan with low level exercise Myoview (02/2013): No ischemia, EF 72%; normal study  . Hx of echocardiogram    a.  Echocardiogram (12/26/2012): EF 60-65%, grade 1 diastolic dysfunction;   b.  Echocardiogram (02/2013): EF 55-60%, no WMA,   trivial effusion  . Hyperkalemia   . Hyponatremia   . Hypotension   . Hypothyroidism   . Mucositis   . Multiple myeloma   . Myocardial infarction [facility])    in past, patient was unaware.   . Neuropathy   . Nodule of left lung 09/13/2015  . Pain in joint, pelvic region and thigh 07/07/2015  . Pneumonia    several  . PONV (postoperative nausea and vomiting) 2008   after mastestomy    ALLERGIES:  is allergic to codeine;  latex; chocolate; onion; oxycodone; zyprexa [olanzapine]; adhesive [tape]; heparin; hydrocodone; iodinated diagnostic agents; promethazine-dm; and sulfa antibiotics.  MEDICATIONS:    SURGICAL HISTORY:  Past Surgical History:  Procedure Laterality Date  . ABDOMINAL HYSTERECTOMY  1981  . AV FISTULA PLACEMENT Left 06/19/2013   Procedure: CREATION OF LEFT ARM ARTERIOVENOUS (AV) FISTULA ;  Surgeon: Angelia Mould, MD;  Location: [facility];  Service: Vascular;  Laterality: Left;  . BREAST RECONSTRUCTION    . BREAST SURGERY Right    reduction  . CATARACT EXTRACTION, BILATERAL    . CHOLECYSTECTOMY  1971  . COLONOSCOPY    . EYE SURGERY Bilateral    lens implant  . history of Port removal    . LUNG BIOPSY  10/21/2015  . MASTECTOMY Left 2008  . PORTACATH PLACEMENT  12/2012   has had 2  . RESECTION OF ARTERIOVENOUS FISTULA ANEURYSM Left 06/04/2017   Procedure: LIGATION ANEURYSM OF LEFT ARTERIOVENOUS FISTULA;  Surgeon: Angelia Mould, MD;  Location: [place] [facility];  Service: Vascular;  Laterality: Left;  . Status post stem cell transplant on September 28, 2008.      REVIEW OF SYSTEMS:  Constitutional: positive for fatigue Eyes: negative Ears, nose, mouth, throat, and face: negative Respiratory: positive for dyspnea on exertion Cardiovascular: negative Gastrointestinal: negative Genitourinary:negative Integument/breast: negative Hematologic/lymphatic: negative Musculoskeletal:negative Neurological: negative Behavioral/Psych: negative Endocrine: negative Allergic/Immunologic: negative   PHYSICAL EXAMINATION: General appearance: alert, cooperative, fatigued and no distress Head: Normocephalic, without obvious abnormality, atraumatic Neck: no adenopathy, no JVD, supple, symmetrical, trachea midline and thyroid not enlarged, symmetric, no tenderness/mass/nodules Lymph nodes: Cervical, supraclavicular, and axillary nodes normal. Resp: clear to auscultation bilaterally Back: symmetric, no  curvature. ROM normal. No CVA tenderness. Cardio: regular rate and rhythm, S1, S2 normal, no murmur, click, rub or gallop GI: soft, non-tender; bowel sounds normal; no masses,  no organomegaly Extremities: extremities normal, atraumatic, no cyanosis or edema Neurologic: Alert and oriented X 3, normal strength and tone. Normal symmetric reflexes. Normal coordination and gait  ECOG PERFORMANCE STATUS: 1 - Symptomatic but completely ambulatory  Blood pressure 137/66, pulse 72, temperature 99.4 F (37.4 C), temperature source Oral, resp. rate 18, height 5' 5" (1.651 m), weight 176 lb 3.2 oz (79.9 kg), SpO2 99 %.  LABORATORY DATA: Lab Results  Component Value Date   WBC 7.3 12/18/2017   HGB 11.2 (L) 12/18/2017   HCT 33.9 (L) 12/18/2017   MCV 103.4 (H) 12/18/2017   PLT 107 (L) 12/18/2017      Chemistry      Component Value Date/Time   NA 139 12/18/2017 1137   NA 144 01/17/2017 1146   K 4.1 12/18/2017 1137   K 3.7 01/17/2017 1146   CL 104 12/18/2017 1137   CL 105 03/19/2012 0811   CO2 26 12/18/2017 1137   CO2 25 01/17/2017 1146   BUN 39 (H) 12/18/2017 1137   BUN 35.0 (H) 01/17/2017 1146   CREATININE 3.16 (H) 12/18/2017 1137   CREATININE 2.8 (  H) 01/17/2017 1146      Component Value Date/Time   CALCIUM 9.9 12/18/2017 1137   CALCIUM 9.1 01/17/2017 1146   ALKPHOS 98 12/17/2017 1141   ALKPHOS 110 01/17/2017 1146   AST 14 12/18/2017 1137   AST 17 12/17/2017 1141   AST 20 01/17/2017 1146   ALT 12 12/18/2017 1137   ALT 11 12/17/2017 1141   ALT 22 01/17/2017 1146   BILITOT 0.8 12/18/2017 1137   BILITOT 0.8 12/17/2017 1141   BILITOT 0.73 01/17/2017 1146       RADIOGRAPHIC STUDIES: No results found.  ASSESSMENT AND PLAN:  This is a very pleasant 77 years old white female with relapsed multiple myeloma status post several chemotherapy regimens and she is currently on treatment with systemic chemotherapy with Carfilzomib, Cytoxan and Decadron status post 7 cycles.  She has been  off treatment for the last 12 months. The patient is feeling very well today with no concerning complaints except for fatigue.  She had repeat myeloma panel performed recently.  I discussed the result with the patient today.  Her myeloma panel showed no concerning finding for disease progression.  The patient would like to resume her treatment with chemotherapy again based on the recommendation of her nephrologist for fear of worsening of her renal function. I had a lengthy discussion with the patient about her condition and I gave her the option of continuous observation and monitoring but the patient would like to resume the treatment.  She is expected to start cycle #8 of her treatment today. I will see her back for follow-up visit in 4 weeks for evaluation before starting the next cycle of her treatment. For the renal insufficiency she will continue her current follow-up visit and evaluation by Dr. Coladonato. She was advised to call immediately if she has any concerning symptoms in the interval. The patient voices understanding of current disease status and treatment options and is in agreement with the current care plan. All questions were answered. The patient knows to call the clinic with any problems, questions or concerns. We can certainly see the patient much sooner if necessary.  Disclaimer: This note was dictated with voice recognition software. Similar sounding words can inadvertently be transcribed and may not be corrected upon review.       

## 2017-12-26 ENCOUNTER — Telehealth: Payer: Self-pay | Admitting: Internal Medicine

## 2017-12-26 ENCOUNTER — Telehealth: Payer: Self-pay | Admitting: Medical Oncology

## 2017-12-26 ENCOUNTER — Inpatient Hospital Stay: Payer: Medicare Other

## 2017-12-26 VITALS — BP 132/64 | HR 60 | Temp 98.0°F

## 2017-12-26 DIAGNOSIS — R5383 Other fatigue: Secondary | ICD-10-CM | POA: Diagnosis not present

## 2017-12-26 DIAGNOSIS — I10 Essential (primary) hypertension: Secondary | ICD-10-CM | POA: Diagnosis not present

## 2017-12-26 DIAGNOSIS — Z5112 Encounter for antineoplastic immunotherapy: Secondary | ICD-10-CM | POA: Diagnosis not present

## 2017-12-26 DIAGNOSIS — Z5111 Encounter for antineoplastic chemotherapy: Secondary | ICD-10-CM | POA: Diagnosis not present

## 2017-12-26 DIAGNOSIS — C9002 Multiple myeloma in relapse: Secondary | ICD-10-CM | POA: Diagnosis not present

## 2017-12-26 DIAGNOSIS — N289 Disorder of kidney and ureter, unspecified: Secondary | ICD-10-CM | POA: Diagnosis not present

## 2017-12-26 DIAGNOSIS — C9000 Multiple myeloma not having achieved remission: Secondary | ICD-10-CM

## 2017-12-26 MED ORDER — HEPARIN SOD (PORK) LOCK FLUSH 100 UNIT/ML IV SOLN
500.0000 [IU] | Freq: Once | INTRAVENOUS | Status: AC | PRN
  Administered 2017-12-26: 500 [IU]
  Filled 2017-12-26: qty 5

## 2017-12-26 MED ORDER — ONDANSETRON HCL 8 MG PO TABS
ORAL_TABLET | ORAL | Status: AC
  Filled 2017-12-26: qty 1

## 2017-12-26 MED ORDER — DEXAMETHASONE SODIUM PHOSPHATE 10 MG/ML IJ SOLN
10.0000 mg | Freq: Once | INTRAMUSCULAR | Status: AC
  Administered 2017-12-26: 10 mg via INTRAVENOUS

## 2017-12-26 MED ORDER — ONDANSETRON HCL 8 MG PO TABS
8.0000 mg | ORAL_TABLET | Freq: Once | ORAL | Status: AC
  Administered 2017-12-26: 8 mg via ORAL

## 2017-12-26 MED ORDER — DEXTROSE 5 % IV SOLN
27.0000 mg/m2 | Freq: Once | INTRAVENOUS | Status: AC
  Administered 2017-12-26: 50 mg via INTRAVENOUS
  Filled 2017-12-26: qty 25

## 2017-12-26 MED ORDER — SODIUM CHLORIDE 0.9 % IV SOLN
Freq: Once | INTRAVENOUS | Status: DC
  Filled 2017-12-26: qty 250

## 2017-12-26 MED ORDER — SODIUM CHLORIDE 0.9 % IJ SOLN
10.0000 mL | INTRAMUSCULAR | Status: DC | PRN
  Administered 2017-12-26: 10 mL
  Filled 2017-12-26: qty 10

## 2017-12-26 MED ORDER — DEXAMETHASONE SODIUM PHOSPHATE 10 MG/ML IJ SOLN
INTRAMUSCULAR | Status: AC
  Filled 2017-12-26: qty 1

## 2017-12-26 NOTE — Patient Instructions (Signed)
[facility] Discharge Instructions for Patients Receiving Chemotherapy  Today you received the following chemotherapy agents Zoledronic Acid (Zometa), Cyclophosphamide (CYTOXAN) & Carfilzomib (KYPROLIS).  To help prevent nausea and vomiting after your treatment, we encourage you to take your nausea medication as prescribed.   If you develop nausea and vomiting that is not controlled by your nausea medication, call the clinic.   BELOW ARE SYMPTOMS THAT SHOULD BE REPORTED IMMEDIATELY:  *FEVER GREATER THAN 100.5 F  *CHILLS WITH OR WITHOUT FEVER  NAUSEA AND VOMITING THAT IS NOT CONTROLLED WITH YOUR NAUSEA MEDICATION  *UNUSUAL SHORTNESS OF BREATH  *UNUSUAL BRUISING OR BLEEDING  TENDERNESS IN MOUTH AND THROAT WITH OR WITHOUT PRESENCE OF ULCERS  *URINARY PROBLEMS  *BOWEL PROBLEMS  UNUSUAL RASH Items with * indicate a potential emergency and should be followed up as soon as possible.  Feel free to call the clinic should you have any questions or concerns. The clinic phone number is [phone].  Please show the [place] at check-in to the Emergency Department and triage nurse.

## 2017-12-26 NOTE — Telephone Encounter (Signed)
Scheduled appt per 12/11 los - unable to schedule next week appt - due to cap - logged - will call pt when appt is added.

## 2017-12-26 NOTE — Telephone Encounter (Signed)
Schedule message sent for tx  day 8,9 day 15,16

## 2017-12-27 ENCOUNTER — Other Ambulatory Visit: Payer: Self-pay | Admitting: Internal Medicine

## 2017-12-27 ENCOUNTER — Telehealth: Payer: Self-pay | Admitting: Internal Medicine

## 2017-12-27 DIAGNOSIS — C9001 Multiple myeloma in remission: Secondary | ICD-10-CM

## 2017-12-27 NOTE — Telephone Encounter (Signed)
Added appts in per 12/11 los - pt is aware of appts for 12/19

## 2018-01-02 ENCOUNTER — Inpatient Hospital Stay: Payer: Medicare Other

## 2018-01-02 VITALS — BP 107/65 | HR 80 | Temp 98.1°F | Resp 20

## 2018-01-02 DIAGNOSIS — R5383 Other fatigue: Secondary | ICD-10-CM | POA: Diagnosis not present

## 2018-01-02 DIAGNOSIS — N289 Disorder of kidney and ureter, unspecified: Secondary | ICD-10-CM | POA: Diagnosis not present

## 2018-01-02 DIAGNOSIS — I10 Essential (primary) hypertension: Secondary | ICD-10-CM | POA: Diagnosis not present

## 2018-01-02 DIAGNOSIS — Z5111 Encounter for antineoplastic chemotherapy: Secondary | ICD-10-CM | POA: Diagnosis not present

## 2018-01-02 DIAGNOSIS — Z5112 Encounter for antineoplastic immunotherapy: Secondary | ICD-10-CM | POA: Diagnosis not present

## 2018-01-02 DIAGNOSIS — C9000 Multiple myeloma not having achieved remission: Secondary | ICD-10-CM

## 2018-01-02 DIAGNOSIS — C9002 Multiple myeloma in relapse: Secondary | ICD-10-CM | POA: Diagnosis not present

## 2018-01-02 LAB — CBC WITH DIFFERENTIAL (CANCER CENTER ONLY)
Abs Immature Granulocytes: 0.03 10*3/uL (ref 0.00–0.07)
Basophils Absolute: 0 10*3/uL (ref 0.0–0.1)
Basophils Relative: 0 %
Eosinophils Absolute: 0.2 10*3/uL (ref 0.0–0.5)
Eosinophils Relative: 5 %
HCT: 33.6 % — ABNORMAL LOW (ref 36.0–46.0)
HEMOGLOBIN: 10.6 g/dL — AB (ref 12.0–15.0)
Immature Granulocytes: 1 %
LYMPHS PCT: 23 %
Lymphs Abs: 0.9 10*3/uL (ref 0.7–4.0)
MCH: 34 pg (ref 26.0–34.0)
MCHC: 31.5 g/dL (ref 30.0–36.0)
MCV: 107.7 fL — ABNORMAL HIGH (ref 80.0–100.0)
Monocytes Absolute: 0.5 10*3/uL (ref 0.1–1.0)
Monocytes Relative: 13 %
NEUTROS PCT: 58 %
Neutro Abs: 2.5 10*3/uL (ref 1.7–7.7)
Platelet Count: 63 10*3/uL — ABNORMAL LOW (ref 150–400)
RBC: 3.12 MIL/uL — ABNORMAL LOW (ref 3.87–5.11)
RDW: 14.7 % (ref 11.5–15.5)
WBC Count: 4.2 10*3/uL (ref 4.0–10.5)
nRBC: 0 % (ref 0.0–0.2)

## 2018-01-02 LAB — CMP (CANCER CENTER ONLY)
ALT: 19 U/L (ref 0–44)
ANION GAP: 9 (ref 5–15)
AST: 16 U/L (ref 15–41)
Albumin: 3.5 g/dL (ref 3.5–5.0)
Alkaline Phosphatase: 109 U/L (ref 38–126)
BUN: 39 mg/dL — ABNORMAL HIGH (ref 8–23)
CHLORIDE: 107 mmol/L (ref 98–111)
CO2: 25 mmol/L (ref 22–32)
Calcium: 9.8 mg/dL (ref 8.9–10.3)
Creatinine: 2.86 mg/dL — ABNORMAL HIGH (ref 0.44–1.00)
GFR, Est AFR Am: 18 mL/min — ABNORMAL LOW (ref >60)
GFR, Estimated: 15 mL/min — ABNORMAL LOW (ref >60)
Glucose, Bld: 105 mg/dL — ABNORMAL HIGH (ref 70–99)
Potassium: 4.6 mmol/L (ref 3.5–5.1)
Sodium: 141 mmol/L (ref 135–145)
Total Bilirubin: 1.1 mg/dL (ref 0.3–1.2)
Total Protein: 6 g/dL — ABNORMAL LOW (ref 6.5–8.1)

## 2018-01-02 MED ORDER — SODIUM CHLORIDE 0.9 % IJ SOLN
10.0000 mL | INTRAMUSCULAR | Status: DC | PRN
  Administered 2018-01-02: 10 mL
  Filled 2018-01-02: qty 10

## 2018-01-02 MED ORDER — DEXTROSE 5 % IV SOLN
27.0000 mg/m2 | Freq: Once | INTRAVENOUS | Status: AC
  Administered 2018-01-02: 50 mg via INTRAVENOUS
  Filled 2018-01-02: qty 10

## 2018-01-02 MED ORDER — SODIUM CHLORIDE 0.9 % IV SOLN
Freq: Once | INTRAVENOUS | Status: AC
  Administered 2018-01-02: 14:00:00 via INTRAVENOUS
  Filled 2018-01-02: qty 250

## 2018-01-02 MED ORDER — DEXAMETHASONE SODIUM PHOSPHATE 10 MG/ML IJ SOLN
10.0000 mg | Freq: Once | INTRAMUSCULAR | Status: AC
  Administered 2018-01-02: 10 mg via INTRAVENOUS

## 2018-01-02 MED ORDER — DEXAMETHASONE SODIUM PHOSPHATE 10 MG/ML IJ SOLN
INTRAMUSCULAR | Status: AC
  Filled 2018-01-02: qty 1

## 2018-01-02 MED ORDER — ONDANSETRON HCL 8 MG PO TABS
ORAL_TABLET | ORAL | Status: AC
  Filled 2018-01-02: qty 1

## 2018-01-02 MED ORDER — SODIUM CHLORIDE 0.9 % IV SOLN
225.0000 mg/m2 | Freq: Once | INTRAVENOUS | Status: AC
  Administered 2018-01-02: 420 mg via INTRAVENOUS
  Filled 2018-01-02: qty 21

## 2018-01-02 MED ORDER — ONDANSETRON HCL 8 MG PO TABS
8.0000 mg | ORAL_TABLET | Freq: Once | ORAL | Status: AC
  Administered 2018-01-02: 8 mg via ORAL

## 2018-01-02 MED ORDER — HEPARIN SOD (PORK) LOCK FLUSH 100 UNIT/ML IV SOLN
500.0000 [IU] | Freq: Once | INTRAVENOUS | Status: AC | PRN
  Administered 2018-01-02: 500 [IU]
  Filled 2018-01-02: qty 5

## 2018-01-02 MED ORDER — SODIUM CHLORIDE 0.9 % IV SOLN
Freq: Once | INTRAVENOUS | Status: DC
  Filled 2018-01-02: qty 250

## 2018-01-02 NOTE — Progress Notes (Signed)
Cbc/cmet reviewed by MD ok to treat despite plts 63

## 2018-01-02 NOTE — Patient Instructions (Signed)
[facility] Discharge Instructions for Patients Receiving Chemotherapy  Today you received the following chemotherapy agents:  Kyprolis and Cytoxan.  To help prevent nausea and vomiting after your treatment, we encourage you to take your nausea medication as directed.   If you develop nausea and vomiting that is not controlled by your nausea medication, call the clinic.   BELOW ARE SYMPTOMS THAT SHOULD BE REPORTED IMMEDIATELY:  *FEVER GREATER THAN 100.5 F  *CHILLS WITH OR WITHOUT FEVER  NAUSEA AND VOMITING THAT IS NOT CONTROLLED WITH YOUR NAUSEA MEDICATION  *UNUSUAL SHORTNESS OF BREATH  *UNUSUAL BRUISING OR BLEEDING  TENDERNESS IN MOUTH AND THROAT WITH OR WITHOUT PRESENCE OF ULCERS  *URINARY PROBLEMS  *BOWEL PROBLEMS  UNUSUAL RASH Items with * indicate a potential emergency and should be followed up as soon as possible.  Feel free to call the clinic should you have any questions or concerns. The clinic phone number is [phone].  Please show the [facility] at check-in to the Emergency Department and triage nurse.

## 2018-01-03 ENCOUNTER — Encounter: Payer: Self-pay | Admitting: *Deleted

## 2018-01-03 ENCOUNTER — Inpatient Hospital Stay: Payer: Medicare Other

## 2018-01-03 VITALS — BP 148/93 | HR 79 | Temp 101.8°F | Resp 18

## 2018-01-03 DIAGNOSIS — I10 Essential (primary) hypertension: Secondary | ICD-10-CM | POA: Diagnosis not present

## 2018-01-03 DIAGNOSIS — Z5112 Encounter for antineoplastic immunotherapy: Secondary | ICD-10-CM | POA: Diagnosis not present

## 2018-01-03 DIAGNOSIS — C9000 Multiple myeloma not having achieved remission: Secondary | ICD-10-CM

## 2018-01-03 DIAGNOSIS — Z5111 Encounter for antineoplastic chemotherapy: Secondary | ICD-10-CM | POA: Diagnosis not present

## 2018-01-03 DIAGNOSIS — R5383 Other fatigue: Secondary | ICD-10-CM | POA: Diagnosis not present

## 2018-01-03 DIAGNOSIS — N289 Disorder of kidney and ureter, unspecified: Secondary | ICD-10-CM | POA: Diagnosis not present

## 2018-01-03 DIAGNOSIS — C9002 Multiple myeloma in relapse: Secondary | ICD-10-CM | POA: Diagnosis not present

## 2018-01-03 MED ORDER — SODIUM CHLORIDE 0.9 % IV SOLN
Freq: Once | INTRAVENOUS | Status: AC
  Administered 2018-01-03: 14:00:00 via INTRAVENOUS
  Filled 2018-01-03: qty 250

## 2018-01-03 MED ORDER — SODIUM CHLORIDE 0.9 % IJ SOLN
10.0000 mL | INTRAMUSCULAR | Status: DC | PRN
  Filled 2018-01-03: qty 10

## 2018-01-03 MED ORDER — ONDANSETRON HCL 8 MG PO TABS
ORAL_TABLET | ORAL | Status: AC
  Filled 2018-01-03: qty 1

## 2018-01-03 MED ORDER — ONDANSETRON HCL 8 MG PO TABS
8.0000 mg | ORAL_TABLET | Freq: Once | ORAL | Status: AC
  Administered 2018-01-03: 8 mg via ORAL

## 2018-01-03 MED ORDER — ACETAMINOPHEN 325 MG PO TABS
ORAL_TABLET | ORAL | Status: AC
  Filled 2018-01-03: qty 1

## 2018-01-03 MED ORDER — SODIUM CHLORIDE 0.9 % IV SOLN
Freq: Once | INTRAVENOUS | Status: DC
  Filled 2018-01-03: qty 250

## 2018-01-03 MED ORDER — HEPARIN SOD (PORK) LOCK FLUSH 100 UNIT/ML IV SOLN
500.0000 [IU] | Freq: Once | INTRAVENOUS | Status: DC | PRN
  Filled 2018-01-03: qty 5

## 2018-01-03 MED ORDER — DEXTROSE 5 % IV SOLN
27.0000 mg/m2 | Freq: Once | INTRAVENOUS | Status: AC
  Administered 2018-01-03: 50 mg via INTRAVENOUS
  Filled 2018-01-03: qty 10

## 2018-01-03 MED ORDER — DEXAMETHASONE SODIUM PHOSPHATE 10 MG/ML IJ SOLN
INTRAMUSCULAR | Status: AC
  Filled 2018-01-03: qty 1

## 2018-01-03 MED ORDER — DEXAMETHASONE SODIUM PHOSPHATE 10 MG/ML IJ SOLN
10.0000 mg | Freq: Once | INTRAMUSCULAR | Status: AC
  Administered 2018-01-03: 10 mg via INTRAVENOUS

## 2018-01-03 MED ORDER — ACETAMINOPHEN 325 MG PO TABS
325.0000 mg | ORAL_TABLET | Freq: Once | ORAL | Status: AC
  Administered 2018-01-03: 325 mg via ORAL

## 2018-01-03 NOTE — Progress Notes (Signed)
Okay to treat with temp 101.0 per Dr. Alen Blew. Verbal order for Tylenol received. Rechecked temp and 101.8. Patient made aware to recheck temp at home, maintain neutropenic precautions, and if temp does not resolve, gets higher or chills associated with to then go to ED.

## 2018-01-03 NOTE — Patient Instructions (Signed)
[facility] Discharge Instructions for Patients Receiving Chemotherapy  Today you received the following chemotherapy agents:  Kyprolis  To help prevent nausea and vomiting after your treatment, we encourage you to take your nausea medication as directed.   If you develop nausea and vomiting that is not controlled by your nausea medication, call the clinic.   BELOW ARE SYMPTOMS THAT SHOULD BE REPORTED IMMEDIATELY:  *FEVER GREATER THAN 100.5 F  *CHILLS WITH OR WITHOUT FEVER  NAUSEA AND VOMITING THAT IS NOT CONTROLLED WITH YOUR NAUSEA MEDICATION  *UNUSUAL SHORTNESS OF BREATH  *UNUSUAL BRUISING OR BLEEDING  TENDERNESS IN MOUTH AND THROAT WITH OR WITHOUT PRESENCE OF ULCERS  *URINARY PROBLEMS  *BOWEL PROBLEMS  UNUSUAL RASH Items with * indicate a potential emergency and should be followed up as soon as possible.  Feel free to call the clinic should you have any questions or concerns. The clinic phone number is [phone].  Please show the CHEMO ALERT CARD at check-in to the Emergency Department and triage nurse.   

## 2018-01-03 NOTE — Progress Notes (Unsigned)
[place] Work  Patient stopped by [place] office to request assistance in completing [employer] provider renewal form for [facility] services.  CSW will complete form and fax to [employer] on patient's behalf.  Ms. Abbasi reported she uses [facility] to all appointments, but often has to wait several hours to be picked up for a quick appointment at [facility].  CSW briefly discussed [place] transportation program.  Patient is interested in enrolling.  CSW will make referral to transportation coordinator.     Gwinda Maine, LCSW  Clinical Social Worker [facility]

## 2018-01-10 ENCOUNTER — Telehealth: Payer: Self-pay | Admitting: Adult Health Nurse Practitioner

## 2018-01-10 ENCOUNTER — Ambulatory Visit (INDEPENDENT_AMBULATORY_CARE_PROVIDER_SITE_OTHER): Payer: Medicare Other | Admitting: Adult Health Nurse Practitioner

## 2018-01-10 ENCOUNTER — Encounter: Payer: Self-pay | Admitting: Adult Health Nurse Practitioner

## 2018-01-10 ENCOUNTER — Inpatient Hospital Stay: Payer: Medicare Other

## 2018-01-10 VITALS — BP 138/78 | HR 58 | Temp 97.9°F | Ht 65.0 in | Wt 178.8 lb

## 2018-01-10 VITALS — BP 130/72 | HR 62 | Temp 98.5°F | Resp 18

## 2018-01-10 DIAGNOSIS — J439 Emphysema, unspecified: Secondary | ICD-10-CM | POA: Diagnosis not present

## 2018-01-10 DIAGNOSIS — B372 Candidiasis of skin and nail: Secondary | ICD-10-CM | POA: Diagnosis not present

## 2018-01-10 DIAGNOSIS — I10 Essential (primary) hypertension: Secondary | ICD-10-CM | POA: Diagnosis not present

## 2018-01-10 DIAGNOSIS — J0100 Acute maxillary sinusitis, unspecified: Secondary | ICD-10-CM

## 2018-01-10 DIAGNOSIS — C9000 Multiple myeloma not having achieved remission: Secondary | ICD-10-CM

## 2018-01-10 DIAGNOSIS — N289 Disorder of kidney and ureter, unspecified: Secondary | ICD-10-CM | POA: Diagnosis not present

## 2018-01-10 DIAGNOSIS — E039 Hypothyroidism, unspecified: Secondary | ICD-10-CM | POA: Diagnosis not present

## 2018-01-10 DIAGNOSIS — Z5111 Encounter for antineoplastic chemotherapy: Secondary | ICD-10-CM | POA: Diagnosis not present

## 2018-01-10 DIAGNOSIS — R5383 Other fatigue: Secondary | ICD-10-CM | POA: Diagnosis not present

## 2018-01-10 DIAGNOSIS — Z5112 Encounter for antineoplastic immunotherapy: Secondary | ICD-10-CM | POA: Diagnosis not present

## 2018-01-10 DIAGNOSIS — C9002 Multiple myeloma in relapse: Secondary | ICD-10-CM | POA: Diagnosis not present

## 2018-01-10 LAB — CBC WITH DIFFERENTIAL (CANCER CENTER ONLY)
Abs Immature Granulocytes: 0.08 10*3/uL — ABNORMAL HIGH (ref 0.00–0.07)
Basophils Absolute: 0 10*3/uL (ref 0.0–0.1)
Basophils Relative: 0 %
Eosinophils Absolute: 0 10*3/uL (ref 0.0–0.5)
Eosinophils Relative: 0 %
HCT: 29.6 % — ABNORMAL LOW (ref 36.0–46.0)
Hemoglobin: 9.2 g/dL — ABNORMAL LOW (ref 12.0–15.0)
IMMATURE GRANULOCYTES: 2 %
Lymphocytes Relative: 23 %
Lymphs Abs: 1.2 10*3/uL (ref 0.7–4.0)
MCH: 33.8 pg (ref 26.0–34.0)
MCHC: 31.1 g/dL (ref 30.0–36.0)
MCV: 108.8 fL — ABNORMAL HIGH (ref 80.0–100.0)
Monocytes Absolute: 0.5 10*3/uL (ref 0.1–1.0)
Monocytes Relative: 10 %
Neutro Abs: 3.3 10*3/uL (ref 1.7–7.7)
Neutrophils Relative %: 65 %
Platelet Count: 73 10*3/uL — ABNORMAL LOW (ref 150–400)
RBC: 2.72 MIL/uL — AB (ref 3.87–5.11)
RDW: 15.2 % (ref 11.5–15.5)
WBC: 5.1 10*3/uL (ref 4.0–10.5)
nRBC: 0 % (ref 0.0–0.2)

## 2018-01-10 LAB — CMP (CANCER CENTER ONLY)
ALT: 59 U/L — ABNORMAL HIGH (ref 0–44)
ANION GAP: 8 (ref 5–15)
AST: 27 U/L (ref 15–41)
Albumin: 3.4 g/dL — ABNORMAL LOW (ref 3.5–5.0)
Alkaline Phosphatase: 131 U/L — ABNORMAL HIGH (ref 38–126)
BUN: 45 mg/dL — ABNORMAL HIGH (ref 8–23)
CO2: 25 mmol/L (ref 22–32)
Calcium: 9.6 mg/dL (ref 8.9–10.3)
Chloride: 111 mmol/L (ref 98–111)
Creatinine: 3.25 mg/dL (ref 0.44–1.00)
GFR, Est AFR Am: 15 mL/min — ABNORMAL LOW (ref >60)
GFR, Estimated: 13 mL/min — ABNORMAL LOW (ref >60)
Glucose, Bld: 136 mg/dL — ABNORMAL HIGH (ref 70–99)
Potassium: 4.7 mmol/L (ref 3.5–5.1)
Sodium: 144 mmol/L (ref 135–145)
Total Bilirubin: 0.7 mg/dL (ref 0.3–1.2)
Total Protein: 5.9 g/dL — ABNORMAL LOW (ref 6.5–8.1)

## 2018-01-10 MED ORDER — DEXAMETHASONE SODIUM PHOSPHATE 10 MG/ML IJ SOLN
10.0000 mg | Freq: Once | INTRAMUSCULAR | Status: AC
  Administered 2018-01-10: 10 mg via INTRAVENOUS

## 2018-01-10 MED ORDER — LEVOCETIRIZINE DIHYDROCHLORIDE 5 MG PO TABS: 5.0000 mg | ORAL_TABLET | Freq: Every evening | ORAL | 3 refills | Status: DC

## 2018-01-10 MED ORDER — DEXAMETHASONE SODIUM PHOSPHATE 10 MG/ML IJ SOLN
INTRAMUSCULAR | Status: AC
  Filled 2018-01-10: qty 1

## 2018-01-10 MED ORDER — SODIUM CHLORIDE 0.9 % IV SOLN
Freq: Once | INTRAVENOUS | Status: AC
  Filled 2018-01-10: qty 250

## 2018-01-10 MED ORDER — HEPARIN SOD (PORK) LOCK FLUSH 100 UNIT/ML IV SOLN
500.0000 [IU] | Freq: Once | INTRAVENOUS | Status: AC | PRN
  Administered 2018-01-10: 500 [IU]
  Filled 2018-01-10: qty 5

## 2018-01-10 MED ORDER — FLUCONAZOLE 150 MG PO TABS: ORAL_TABLET | ORAL | 0 refills | Status: DC

## 2018-01-10 MED ORDER — SODIUM CHLORIDE 0.9 % IV SOLN
225.0000 mg/m2 | Freq: Once | INTRAVENOUS | Status: AC
  Administered 2018-01-10: 420 mg via INTRAVENOUS
  Filled 2018-01-10: qty 21

## 2018-01-10 MED ORDER — SODIUM CHLORIDE 0.9 % IV SOLN
Freq: Once | INTRAVENOUS | Status: AC
  Administered 2018-01-10: 14:00:00 via INTRAVENOUS
  Filled 2018-01-10: qty 250

## 2018-01-10 MED ORDER — ONDANSETRON HCL 8 MG PO TABS
8.0000 mg | ORAL_TABLET | Freq: Once | ORAL | Status: AC
  Administered 2018-01-10: 8 mg via ORAL

## 2018-01-10 MED ORDER — DEXTROSE 5 % IV SOLN
27.0000 mg/m2 | Freq: Once | INTRAVENOUS | Status: AC
  Administered 2018-01-10: 50 mg via INTRAVENOUS
  Filled 2018-01-10: qty 15

## 2018-01-10 MED ORDER — ALBUTEROL SULFATE HFA 108 (90 BASE) MCG/ACT IN AERS: 1.0000 | INHALATION_SPRAY | Freq: Four times a day (QID) | RESPIRATORY_TRACT | 3 refills | Status: DC | PRN

## 2018-01-10 MED ORDER — UMECLIDINIUM-VILANTEROL 62.5-25 MCG/INH IN AEPB: 1.0000 | INHALATION_SPRAY | Freq: Every day | RESPIRATORY_TRACT | 1 refills | Status: DC | PRN

## 2018-01-10 MED ORDER — IPRATROPIUM-ALBUTEROL 0.5-2.5 (3) MG/3ML IN SOLN: 3.0000 mL | RESPIRATORY_TRACT | 0 refills | Status: DC | PRN

## 2018-01-10 MED ORDER — NYSTATIN 100000 UNIT/GM EX POWD: Freq: Two times a day (BID) | CUTANEOUS | 0 refills | Status: DC

## 2018-01-10 MED ORDER — SODIUM CHLORIDE 0.9 % IJ SOLN
10.0000 mL | INTRAMUSCULAR | Status: DC | PRN
  Administered 2018-01-10: 10 mL
  Filled 2018-01-10: qty 10

## 2018-01-10 MED ORDER — ONDANSETRON HCL 8 MG PO TABS
ORAL_TABLET | ORAL | Status: AC
  Filled 2018-01-10: qty 1

## 2018-01-10 MED ORDER — AMOXICILLIN-POT CLAVULANATE 875-125 MG PO TABS: 1.0000 | ORAL_TABLET | Freq: Two times a day (BID) | ORAL | 0 refills | Status: DC

## 2018-01-10 MED ORDER — AZELASTINE HCL 0.1 % NA SOLN: 1.0000 | Freq: Every day | NASAL | 2 refills | Status: DC

## 2018-01-10 NOTE — Progress Notes (Signed)
Ok to treat with labs today per MD Julien Nordmann

## 2018-01-10 NOTE — Patient Instructions (Signed)
[facility] Discharge Instructions for Patients Receiving Chemotherapy  Today you received the following chemotherapy agents:  Kyprolis and Cytoxan.  To help prevent nausea and vomiting after your treatment, we encourage you to take your nausea medication as directed.   If you develop nausea and vomiting that is not controlled by your nausea medication, call the clinic.   BELOW ARE SYMPTOMS THAT SHOULD BE REPORTED IMMEDIATELY:  *FEVER GREATER THAN 100.5 F  *CHILLS WITH OR WITHOUT FEVER  NAUSEA AND VOMITING THAT IS NOT CONTROLLED WITH YOUR NAUSEA MEDICATION  *UNUSUAL SHORTNESS OF BREATH  *UNUSUAL BRUISING OR BLEEDING  TENDERNESS IN MOUTH AND THROAT WITH OR WITHOUT PRESENCE OF ULCERS  *URINARY PROBLEMS  *BOWEL PROBLEMS  UNUSUAL RASH Items with * indicate a potential emergency and should be followed up as soon as possible.  Feel free to call the clinic should you have any questions or concerns. The clinic phone number is [phone].  Please show the [place] at check-in to the Emergency Department and triage nurse.

## 2018-01-10 NOTE — Patient Instructions (Addendum)
We are sending in Augmentin, an antibiotic for Sinusitis. We will also send in Diflucan 150mg  take one tablet then one three days later  Consider Neti Pot: Use warm distilled or bottled water. DO NOT USE TAP WATER! We will send in levociterizine.  Take this in place of the Zyrtec to see if this help with your ears.  I will place an order for a nebulizer machine and albuterol nebulizer treatments.  Use these every 4-6 hours as needed for shortness of breath or wheezing.  Call or return with new or worsening symptoms as discussed in appointment.  May contact via office phone [phone] or via [place].

## 2018-01-10 NOTE — Progress Notes (Signed)
Assessment and Plan:  Nancy Norris was seen today for acute visit, ear fullness and sinusitis.  Diagnoses and all orders for this visit:  Pulmonary emphysema, unspecified emphysema type ([place]) -     umeclidinium-vilanterol (ANORO ELLIPTA) 62.5-25 MCG/INH AEPB; Inhale 1 puff into the lungs daily as needed (shortness of breath). Patient would benenfit from nebulizer has used this in the past but no longer has equipment.   -     Rx DME Nebulizer machine -     ipratropium-albuterol (DUONEB) 0.5-2.5 (3) MG/3ML SOLN; Take 3 mLs by nebulization every 4 (four) hours as needed. Max:6 doses per day Discussed medications and side effects.    Acute non-recurrent maxillary sinusitis -     azelastine (ASTELIN) 0.1 % nasal spray; Place 1 spray into both nostrils daily. Use in each nostril as directed -     amoxicillin-clavulanate (AUGMENTIN) 875-125 MG tablet; Take 1 tablet by mouth 2 (two) times daily. 10 days -     levocetirizine (XYZAL) 5 MG tablet; Take 1 tablet (5 mg total) by mouth every evening. -     fluconazole (DIFLUCAN) 150 MG tablet; Take one tablet at onset of symptoms and second tablet three days later for yeast.  Discussed Neti Pot, BID  Hypothyroidism, unspecified type Will recheck this in two weeks.  Chronic asthma without complication, unspecified asthma severity, unspecified whether persistent -     albuterol (PROVENTIL HFA;VENTOLIN HFA) 108 (90 Base) MCG/ACT inhaler; Inhale 1-2 puffs into the lungs every 6 (six) hours as needed for wheezing or shortness of breath (cough).  Candidal intertrigo -     nystatin (MYCOSTATIN/NYSTOP) powder; Apply topically 2 (two) times daily.       Further disposition pending results of labs. Discussed med's effects and SE's.   Over 30 minutes of exam, counseling, chart review, and critical decision making was performed.   Future Appointments  Date Time Provider [place]  01/10/2018  8:45 AM Garnet Sierras, NP [facility] None  01/10/2018  12:15 PM [facility] LAB 6 [facility] None  01/10/2018  1:00 PM [facility] INFUSION [facility] None  01/11/2018 11:00 AM [facility] INFUSION [facility] None  01/16/2018  2:15 PM [facility] LAB 1 [facility] None  01/23/2018 11:30 AM [facility] LAB 2 [facility] None  01/23/2018 12:00 PM Curt Bears, MD [facility] None  01/23/2018  1:00 PM [facility] INFUSION [facility] None  01/24/2018  1:30 PM [facility] INFUSION [facility] None  01/30/2018 12:15 PM [facility] LAB 1 [facility] None  01/30/2018  1:15 PM [facility] INFUSION [facility] None  01/31/2018  2:00 PM [facility] INFUSION [facility] None  02/06/2018 12:30 PM [facility] LAB 6 [facility] None  02/06/2018  1:30 PM [facility] INFUSION [facility] None  02/07/2018  2:15 PM [facility] INFUSION [facility] None  02/12/2018  2:00 PM [facility] LAB 1 [facility] None  02/19/2018 11:30 AM [facility] LAB 1 [facility] None  02/19/2018 12:00 PM Curt Bears, MD [facility] None  02/19/2018  1:00 PM [facility] INFUSION [facility] None  02/20/2018  2:00 PM [facility] INFUSION [facility] None  02/26/2018 12:00 PM [facility] LAB 1 [facility] None  02/26/2018  1:00 PM [facility] INFUSION [facility] None  02/27/2018  1:30 PM [facility] INFUSION [facility] None  03/05/2018 12:00 PM [facility] LAB 1 [facility] None  03/05/2018  1:00 PM [facility] INFUSION [facility] None  03/06/2018  1:00 PM [facility] INFUSION [facility] None  03/19/2018  9:00 AM Corbett, Caryl Pina, NP [facility] None    ------------------------------------------------------------------------------------------------------------------   HPI 77 y.o.female presents for what she thinks is a sinus infection.  Her symptoms began two weeks ago with  a constant throbbing headache and face tenderness on right side of face.  Right eye blurry vision even with vision. She does wear glasses. Ears are feeling full and she reports this is making her off balance.  She has not taken  any OTC medication for her symptoms.  She does not drive and unable to get out to get medications.  Also reports increasing shortness of breath.  She has been using albuterol 3-4 times a day.  She has used a nebulizer in the past but no longer equipment for this. Neb? She is taking singular during the day and zyrtec at night to help with allergy symptoms.  She has tried other in the past but has been on this regiment for over 6 months. Last Friday she reports a fever and rigors when she was preparing for her chemo treatment.  She received tylenol and proceeded to receive her chemo treatment that day.  She has not had a fever since then.     Past Medical History:  Diagnosis Date  . Anemia   . Anginal pain ([place])    used NTG x 2 May 31 and 06/15/13   . Anxiety   . Arthritis   . B12 deficiency 12/04/2014  . Breast cancer ([place])   . Complication of anesthesia   . COPD (chronic obstructive pulmonary disease) ([place])   . 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 Depression   . Dizziness   . Dyspnea   . Fibromyalgia   . Fibromyalgia   . GERD (gastroesophageal reflux disease)   . Headache(784.0)   . Heart murmur   . Hemoptysis 10/21/2015  . History of blood transfusion    last one May 12   . Hx of cardiovascular stress test    LexiScan with low level exercise Myoview (02/2013): No ischemia, EF 72%; normal study  . Hx of echocardiogram    a.  Echocardiogram (12/26/2012): EF 60-45%, grade 1 diastolic dysfunction;   b.  Echocardiogram (02/2013): EF 55-60%, no WMA, trivial effusion  . Hyperkalemia   . Hyponatremia   . Hypotension   . Hypothyroidism   . Mucositis   . Multiple myeloma   . Myocardial infarction [facility])    in past, patient was unaware.   . Neuropathy   . Nodule of left lung 09/13/2015  . Pain in joint, pelvic region and thigh 07/07/2015  . Pneumonia    several  . PONV (postoperative nausea and vomiting) 2008   after mastestomy     Allergies  Allergen Reactions  . Codeine  Anaphylaxis    anaphylasix tolarates oxycodone per care cast  . Latex Shortness Of Breath  . Chocolate     migraines  . Onion Other (See Comments)    Causes migraine headaches  . Oxycodone   . Zyprexa [Olanzapine] Other (See Comments)    Confusion , dizzy,unsteady  . Adhesive [Tape] Other (See Comments)    blisters  . Heparin Rash  . Hydrocodone Rash    With extreme itching  . Iodinated Diagnostic Agents Hives, Itching, Rash and Other (See Comments)    Happened 60 years ago Stage IV kidney function  . Promethazine-Dm Hives  . Sulfa Antibiotics Itching    Current Outpatient Medications on File Prior to Visit  Medication Sig  . acetaminophen (TYLENOL) 500 MG tablet Take 1,000 mg by mouth daily as needed for moderate pain or headache.  . albuterol (PROVENTIL HFA;VENTOLIN HFA) 108 (90 Base) MCG/ACT inhaler Inhale 1-2 puffs into the lungs every 6 (six)  hours as needed for wheezing or shortness of breath (cough).  Marland Kitchen amLODipine (NORVASC) 5 MG tablet   . azelastine (ASTELIN) 0.1 % nasal spray Place 1 spray into both nostrils daily. Use in each nostril as directed  . buPROPion (WELLBUTRIN) 75 MG tablet Take 1 tablet (75 mg total) by mouth 2 (two) times daily.  . butalbital-acetaminophen-caffeine (FIORICET, ESGIC) 50-325-40 MG tablet Take 1 tablet by mouth every 4 (four) hours as needed for headache or migraine.   . citalopram (CELEXA) 40 MG tablet Take 1 tablet daily for Mood  . cromolyn (OPTICROM) 4 % ophthalmic solution INSTILL 1 TO 2 DROPS IN AFFECTED EYE(S) 4 TIMES A DAY FOR ALLERGIES.  . folic acid (FOLVITE) 1 MG tablet Take 1 tablet (1 mg total) by mouth daily.  . furosemide (LASIX) 40 MG tablet TAKE 1 TABLET BY MOUTH DAILY. (Patient taking differently: Take 40 mg by mouth as needed for fluid)  . gabapentin (NEURONTIN) 300 MG capsule Take 1 capsule (300 mg total) by mouth 3 (three) times daily.  Marland Kitchen levothyroxine (SYNTHROID, LEVOTHROID) 150 MCG tablet TAKE 1 TABLET BY MOUTH DAILY. NEED  OFFICE VISIT FOR FURTHER REFILLS.  Marland Kitchen loperamide (IMODIUM) 2 MG capsule Take 1 capsule (2 mg total) by mouth as needed for diarrhea or loose stools.  Marland Kitchen loratadine (CLARITIN) 10 MG tablet Take 10 mg by mouth daily.  . magic mouthwash SOLN Take 5 mLs by mouth 4 (four) times daily as needed for mouth pain.  . midodrine (PROAMATINE) 10 MG tablet Take 1 tablet daily to support BP  . montelukast (SINGULAIR) 10 MG tablet Take 1 tablet daily for Allergies  . Multiple Vitamins-Minerals (PRESERVISION AREDS 2) CAPS Take 1 capsule by mouth daily.  . nitroGLYCERIN (NITROSTAT) 0.4 MG SL tablet Place 0.4 mg under the tongue every 5 (five) minutes as needed for chest pain.   Marland Kitchen nystatin (MYCOSTATIN/NYSTOP) powder Apply topically 2 (two) times daily. (Patient taking differently: Apply 1 g topically daily. )  . ondansetron (ZOFRAN ODT) 8 MG disintegrating tablet Take 1 tablet (8 mg total) by mouth every 8 (eight) hours as needed for nausea or vomiting.  . pilocarpine (SALAGEN) 5 MG tablet TAKE 1 TABLET BY MOUTH 3 TIMES DAILY FOR DRY MOUTH. (Patient taking differently: TAKE 1 TABLET BY MOUTH 2 TIMES DAILY FOR DRY MOUTH.)  . ranitidine (ZANTAC) 300 MG tablet Take 1 tablet 2 x /day at [facility] & Bedtime for Heart burn & Reflux  . senna (SENOKOT) 8.6 MG tablet Take 1 tablet by mouth daily as needed for constipation.   . sucralfate (CARAFATE) 1 g tablet Take 1 tablet dissolved in water  4 x /day BEFORE Meals & Bedtime for Acid indigestion & Reflux  . traMADol (ULTRAM) 50 MG tablet Take 1 to 2 tablets by mouth 4 times a day as needed for pain. (Patient taking differently: Take 50 mg by mouth 2 (two) times daily as needed for moderate pain or severe pain. )  . umeclidinium-vilanterol (ANORO ELLIPTA) 62.5-25 MCG/INH AEPB Inhale 1 puff into the lungs daily as needed (shortness of breath).  . vitamin B-12 (CYANOCOBALAMIN) 1000 MCG tablet Take 1 tablet (1,000 mcg total) by mouth daily. (Patient taking differently: Take 1,000 mcg by  mouth 2 (two) times daily. )   Current Facility-Administered Medications on File Prior to Visit  Medication  . ipratropium-albuterol (DUONEB) 0.5-2.5 (3) MG/3ML nebulizer solution 3 mL    ROS: .Review of Systems  Constitutional: Negative for chills, diaphoresis, fever, malaise/fatigue and weight loss.  HENT:  Positive for congestion and sinus pain. Negative for ear discharge, ear pain, hearing loss, nosebleeds, sore throat and tinnitus.        Ear fullness  Eyes: Positive for blurred vision. Negative for double vision, photophobia, pain, discharge and redness.  Respiratory: Positive for cough, sputum production, shortness of breath and wheezing. Negative for hemoptysis and stridor.   Cardiovascular: Negative for chest pain, palpitations, orthopnea, claudication, leg swelling and PND.  Gastrointestinal: Negative for abdominal pain, blood in stool, constipation, diarrhea, heartburn, melena, nausea and vomiting.  Genitourinary: Negative for dysuria, flank pain, frequency, hematuria and urgency.  Skin: Negative for itching and rash.  Neurological: Negative for dizziness, tingling, tremors, sensory change, speech change, focal weakness, seizures, loss of consciousness, weakness and headaches.     Physical Exam:  There were no vitals taken for this visit.  General Appearance: Well nourished, in no apparent distress. Eyes: PERRLA, EOMs, conjunctiva no swelling or erythema Sinuses: Positive Frontal/maxillary tenderness ENT/Mouth: Ext aud canals clear, TMs without erythema, bulging. Serous noted right TM. No erythema, swelling, or exudate on post pharynx.  Tonsils not swollen or erythematous. Hearing normal.  Neck: Supple, thyroid normal.  Respiratory: Respiratory effort normal, BS equal bilaterally without rales, rhonchi, or stridor. Wheezing noted RLL. Cardio: RRR with no MRGs. Brisk peripheral pulses without edema.  Abdomen: Soft, + BS.  Non tender, no guarding, rebound, hernias,  masses. Lymphatics: Non tender without lymphadenopathy.  Musculoskeletal: Full ROM, 5/5 strength, normal gait.  Skin: Warm, dry without rashes, lesions, ecchymosis.  Neuro: Cranial nerves intact. Normal muscle tone, no cerebellar symptoms. Sensation intact.  Psych: Awake and oriented X 3, normal affect, Insight and Judgment appropriate.     Garnet Sierras, NP 12:03 AM [facility]

## 2018-01-10 NOTE — Telephone Encounter (Signed)
faxed order to [place] for nebulizer, supplies, and medication to be filed on part D of medicare. Per [place] would be delayed tor a day or so, if needed sooner  call in rx to local pharm,  provider aware, patient prefers delivery from [place]

## 2018-01-11 ENCOUNTER — Inpatient Hospital Stay: Payer: Medicare Other

## 2018-01-11 VITALS — BP 181/104 | HR 51 | Temp 97.8°F | Resp 17

## 2018-01-11 DIAGNOSIS — Z5111 Encounter for antineoplastic chemotherapy: Secondary | ICD-10-CM | POA: Diagnosis not present

## 2018-01-11 DIAGNOSIS — I10 Essential (primary) hypertension: Secondary | ICD-10-CM | POA: Diagnosis not present

## 2018-01-11 DIAGNOSIS — N289 Disorder of kidney and ureter, unspecified: Secondary | ICD-10-CM | POA: Diagnosis not present

## 2018-01-11 DIAGNOSIS — R5383 Other fatigue: Secondary | ICD-10-CM | POA: Diagnosis not present

## 2018-01-11 DIAGNOSIS — C9002 Multiple myeloma in relapse: Secondary | ICD-10-CM | POA: Diagnosis not present

## 2018-01-11 DIAGNOSIS — Z5112 Encounter for antineoplastic immunotherapy: Secondary | ICD-10-CM | POA: Diagnosis not present

## 2018-01-11 MED ORDER — HEPARIN SOD (PORK) LOCK FLUSH 100 UNIT/ML IV SOLN
500.0000 [IU] | Freq: Once | INTRAVENOUS | Status: AC | PRN
  Administered 2018-01-11: 500 [IU]
  Filled 2018-01-11: qty 5

## 2018-01-11 MED ORDER — SODIUM CHLORIDE 0.9 % IV SOLN
Freq: Once | INTRAVENOUS | Status: DC
  Filled 2018-01-11: qty 250

## 2018-01-11 MED ORDER — SODIUM CHLORIDE 0.9 % IV SOLN
Freq: Once | INTRAVENOUS | Status: AC
  Administered 2018-01-11: 11:00:00 via INTRAVENOUS
  Filled 2018-01-11: qty 250

## 2018-01-11 MED ORDER — DEXAMETHASONE SODIUM PHOSPHATE 10 MG/ML IJ SOLN
10.0000 mg | Freq: Once | INTRAMUSCULAR | Status: AC
  Administered 2018-01-11: 10 mg via INTRAVENOUS

## 2018-01-11 MED ORDER — CLONIDINE HCL 0.1 MG PO TABS
ORAL_TABLET | ORAL | Status: AC
  Filled 2018-01-11: qty 1

## 2018-01-11 MED ORDER — SODIUM CHLORIDE 0.9 % IJ SOLN
10.0000 mL | INTRAMUSCULAR | Status: DC | PRN
  Filled 2018-01-11: qty 10

## 2018-01-11 MED ORDER — CLONIDINE HCL 0.1 MG PO TABS
0.1000 mg | ORAL_TABLET | Freq: Once | ORAL | Status: AC
  Administered 2018-01-11: 0.1 mg via ORAL

## 2018-01-11 MED ORDER — DEXAMETHASONE SODIUM PHOSPHATE 10 MG/ML IJ SOLN
INTRAMUSCULAR | Status: AC
  Filled 2018-01-11: qty 1

## 2018-01-11 MED ORDER — ONDANSETRON HCL 8 MG PO TABS
ORAL_TABLET | ORAL | Status: AC
  Filled 2018-01-11: qty 1

## 2018-01-11 MED ORDER — DEXTROSE 5 % IV SOLN
27.0000 mg/m2 | Freq: Once | INTRAVENOUS | Status: AC
  Administered 2018-01-11: 50 mg via INTRAVENOUS
  Filled 2018-01-11: qty 15

## 2018-01-11 MED ORDER — ONDANSETRON HCL 8 MG PO TABS
8.0000 mg | ORAL_TABLET | Freq: Once | ORAL | Status: AC
  Administered 2018-01-11: 8 mg via ORAL

## 2018-01-11 NOTE — Patient Instructions (Signed)
Carfilzomib injection What is this medicine? CARFILZOMIB (kar FILZ oh mib) targets a specific protein within cancer cells and stops the cancer cells from growing. It is used to treat multiple myeloma. This medicine may be used for other purposes; ask your health care provider or pharmacist if you have questions. COMMON BRAND NAME(S): KYPROLIS What should I tell my health care provider before I take this medicine? They need to know if you have any of these conditions: -heart disease -history of blood clots -irregular heartbeat -kidney disease -liver disease -lung or breathing disease -an unusual or allergic reaction to carfilzomib, or other medicines, foods, dyes, or preservatives -pregnant or trying to get pregnant -breast-feeding How should I use this medicine? This medicine is for injection or infusion into a vein. It is given by a health care professional in a hospital or clinic setting. Talk to your pediatrician regarding the use of this medicine in children. Special care may be needed. Overdosage: If you think you have taken too much of this medicine contact a poison control center or emergency room at once. NOTE: This medicine is only for you. Do not share this medicine with others. What if I miss a dose? It is important not to miss your dose. Call your doctor or health care professional if you are unable to keep an appointment. What may interact with this medicine? Interactions are not expected. Give your health care provider a list of all the medicines, herbs, non-prescription drugs, or dietary supplements you use. Also tell them if you smoke, drink alcohol, or use illegal drugs. Some items may interact with your medicine. This list may not describe all possible interactions. Give your health care provider a list of all the medicines, herbs, non-prescription drugs, or dietary supplements you use. Also tell them if you smoke, drink alcohol, or use illegal drugs. Some items may  interact with your medicine. What should I watch for while using this medicine? Your condition will be monitored carefully while you are receiving this medicine. Report any side effects. Continue your course of treatment even though you feel ill unless your doctor tells you to stop. You may need blood work done while you are taking this medicine. Do not become pregnant while taking this medicine or for at least 6 months after stopping it. Women should inform their doctor if they wish to become pregnant or think they might be pregnant. There is a potential for serious side effects to an unborn child. Men should not father a child while taking this medicine and for at least 3 months after stopping it. Talk to your health care professional or pharmacist for more information. Do not breast-feed an infant while taking this medicine or for 2 weeks after the last dose. Check with your doctor or health care professional if you get an attack of severe diarrhea, nausea and vomiting, or if you sweat a lot. The loss of too much body fluid can make it dangerous for you to take this medicine. You may get dizzy. Do not drive, use machinery, or do anything that needs mental alertness until you know how this medicine affects you. Do not stand or sit up quickly, especially if you are an older patient. This reduces the risk of dizzy or fainting spells. What side effects may I notice from receiving this medicine? Side effects that you should report to your doctor or health care professional as soon as possible: -allergic reactions like skin rash, itching or hives, swelling of the face, lips, or  tongue -confusion -dizziness -feeling faint or lightheaded -fever or chills -palpitations -seizures -signs and symptoms of bleeding such as bloody or black, tarry stools; red or dark-brown urine; spitting up blood or brown material that looks like coffee grounds; red spots on the skin; unusual bruising or bleeding including from  the eye, gums, or nose -signs and symptoms of a blood clot such as breathing problems; changes in vision; chest pain; severe, sudden headache; pain, swelling, warmth in the leg; trouble speaking; sudden numbness or weakness of the face, arm or leg -signs and symptoms of kidney injury like trouble passing urine or change in the amount of urine -signs and symptoms of liver injury like dark yellow or brown urine; general ill feeling or flu-like symptoms; light-colored stools; loss of appetite; nausea; right upper belly pain; unusually weak or tired; yellowing of the eyes or skin Side effects that usually do not require medical attention (report to your doctor or health care professional if they continue or are bothersome): -back pain -cough -diarrhea -headache -muscle cramps -vomiting This list may not describe all possible side effects. Call your doctor for medical advice about side effects. You may report side effects to FDA at 1-800-FDA-1088. Where should I keep my medicine? This drug is given in a hospital or clinic and will not be stored at home. NOTE: This sheet is a summary. It may not cover all possible information. If you have questions about this medicine, talk to your doctor, pharmacist, or health care provider.  2019 Elsevier/Gold Standard (2016-10-17 14:07:13)

## 2018-01-11 NOTE — Progress Notes (Signed)
OK to treat with current BP 178/110 with Kyprolis but give another clonidine 0.1 mg orally.    Rechecked BP before hanging Kyprolis b/c pt reports concern about BP being too low & afraid she will pass out if too low.  She reports feeling sleepy.  Pt reports sinus infection on R side of head & no different pain. Discussed with DR Lindi Adie & OK to treat with elevated BP & let pt leave & f/u with her PCP on Monday.  Pt can take BP at home & will take her BP med that she takes at HS.  Confirmed with pt to monitor BP at home & report to PCP on Monday & to take her BP med this pm.  She expressed understanding.

## 2018-01-16 ENCOUNTER — Inpatient Hospital Stay: Payer: Medicare Other | Attending: Internal Medicine

## 2018-01-16 ENCOUNTER — Ambulatory Visit: Payer: Medicare Other

## 2018-01-16 DIAGNOSIS — R531 Weakness: Secondary | ICD-10-CM | POA: Diagnosis not present

## 2018-01-16 DIAGNOSIS — C9000 Multiple myeloma not having achieved remission: Secondary | ICD-10-CM | POA: Diagnosis not present

## 2018-01-16 DIAGNOSIS — Z87891 Personal history of nicotine dependence: Secondary | ICD-10-CM | POA: Diagnosis not present

## 2018-01-16 DIAGNOSIS — Z5112 Encounter for antineoplastic immunotherapy: Secondary | ICD-10-CM | POA: Insufficient documentation

## 2018-01-16 DIAGNOSIS — Z5111 Encounter for antineoplastic chemotherapy: Secondary | ICD-10-CM | POA: Insufficient documentation

## 2018-01-16 DIAGNOSIS — Z86000 Personal history of in-situ neoplasm of breast: Secondary | ICD-10-CM | POA: Diagnosis not present

## 2018-01-16 DIAGNOSIS — Z9221 Personal history of antineoplastic chemotherapy: Secondary | ICD-10-CM | POA: Diagnosis not present

## 2018-01-16 DIAGNOSIS — D630 Anemia in neoplastic disease: Secondary | ICD-10-CM | POA: Insufficient documentation

## 2018-01-16 DIAGNOSIS — Z9484 Stem cells transplant status: Secondary | ICD-10-CM | POA: Diagnosis not present

## 2018-01-16 DIAGNOSIS — N289 Disorder of kidney and ureter, unspecified: Secondary | ICD-10-CM | POA: Insufficient documentation

## 2018-01-16 DIAGNOSIS — R530 Neoplastic (malignant) related fatigue: Secondary | ICD-10-CM | POA: Insufficient documentation

## 2018-01-16 LAB — CMP (CANCER CENTER ONLY)
ALK PHOS: 145 U/L — AB (ref 38–126)
ALT: 93 U/L — ABNORMAL HIGH (ref 0–44)
AST: 81 U/L — ABNORMAL HIGH (ref 15–41)
Albumin: 3 g/dL — ABNORMAL LOW (ref 3.5–5.0)
Anion gap: 8 (ref 5–15)
BILIRUBIN TOTAL: 1.2 mg/dL (ref 0.3–1.2)
BUN: 30 mg/dL — ABNORMAL HIGH (ref 8–23)
CALCIUM: 8.8 mg/dL — AB (ref 8.9–10.3)
CO2: 22 mmol/L (ref 22–32)
Chloride: 109 mmol/L (ref 98–111)
Creatinine: 2.52 mg/dL — ABNORMAL HIGH (ref 0.44–1.00)
GFR, Est AFR Am: 21 mL/min — ABNORMAL LOW (ref >60)
GFR, Estimated: 18 mL/min — ABNORMAL LOW (ref >60)
Glucose, Bld: 114 mg/dL — ABNORMAL HIGH (ref 70–99)
Potassium: 4.9 mmol/L (ref 3.5–5.1)
Sodium: 139 mmol/L (ref 135–145)
Total Protein: 5.5 g/dL — ABNORMAL LOW (ref 6.5–8.1)

## 2018-01-16 LAB — CBC WITH DIFFERENTIAL (CANCER CENTER ONLY)
Abs Immature Granulocytes: 0.02 10*3/uL (ref 0.00–0.07)
Basophils Absolute: 0 10*3/uL (ref 0.0–0.1)
Basophils Relative: 0 %
Eosinophils Absolute: 0.1 10*3/uL (ref 0.0–0.5)
Eosinophils Relative: 3 %
HCT: 30.1 % — ABNORMAL LOW (ref 36.0–46.0)
Hemoglobin: 9.6 g/dL — ABNORMAL LOW (ref 12.0–15.0)
Immature Granulocytes: 1 %
Lymphocytes Relative: 23 %
Lymphs Abs: 0.6 10*3/uL — ABNORMAL LOW (ref 0.7–4.0)
MCH: 34 pg (ref 26.0–34.0)
MCHC: 31.9 g/dL (ref 30.0–36.0)
MCV: 106.7 fL — AB (ref 80.0–100.0)
Monocytes Absolute: 0.4 10*3/uL (ref 0.1–1.0)
Monocytes Relative: 16 %
Neutro Abs: 1.5 10*3/uL — ABNORMAL LOW (ref 1.7–7.7)
Neutrophils Relative %: 57 %
Platelet Count: 49 10*3/uL — ABNORMAL LOW (ref 150–400)
RBC: 2.82 MIL/uL — ABNORMAL LOW (ref 3.87–5.11)
RDW: 15.5 % (ref 11.5–15.5)
WBC Count: 2.6 10*3/uL — ABNORMAL LOW (ref 4.0–10.5)
nRBC: 0 % (ref 0.0–0.2)

## 2018-01-17 ENCOUNTER — Telehealth: Payer: Self-pay | Admitting: Adult Health Nurse Practitioner

## 2018-01-17 NOTE — Telephone Encounter (Signed)
Tiffany @ [facility] unable to reach patient to confirm nebulizer and medicine delivery address. I called patient and asked if she still wanted neb and medicines. Patient agreeable to nebulizer and meds, she will call Tiffany at [facility] to confirm.

## 2018-01-20 ENCOUNTER — Emergency Department (HOSPITAL_COMMUNITY): Payer: Medicare Other

## 2018-01-20 ENCOUNTER — Emergency Department (HOSPITAL_COMMUNITY)
Admission: EM | Admit: 2018-01-20 | Discharge: 2018-01-21 | Disposition: A | Discharge status: Home / Self Care | Payer: Medicare Other | Attending: Emergency Medicine | Admitting: Emergency Medicine

## 2018-01-20 ENCOUNTER — Other Ambulatory Visit: Payer: Self-pay

## 2018-01-20 ENCOUNTER — Encounter (HOSPITAL_COMMUNITY): Payer: Self-pay

## 2018-01-20 ENCOUNTER — Telehealth: Payer: Self-pay

## 2018-01-20 DIAGNOSIS — I132 Hypertensive heart and chronic kidney disease with heart failure and with stage 5 chronic kidney disease, or end stage renal disease: Secondary | ICD-10-CM | POA: Insufficient documentation

## 2018-01-20 DIAGNOSIS — R0602 Shortness of breath: Secondary | ICD-10-CM | POA: Diagnosis not present

## 2018-01-20 DIAGNOSIS — E039 Hypothyroidism, unspecified: Secondary | ICD-10-CM | POA: Insufficient documentation

## 2018-01-20 DIAGNOSIS — Z87891 Personal history of nicotine dependence: Secondary | ICD-10-CM | POA: Diagnosis not present

## 2018-01-20 DIAGNOSIS — Z79899 Other long term (current) drug therapy: Secondary | ICD-10-CM | POA: Insufficient documentation

## 2018-01-20 DIAGNOSIS — Z91040 Latex allergy status: Secondary | ICD-10-CM | POA: Diagnosis not present

## 2018-01-20 DIAGNOSIS — N185 Chronic kidney disease, stage 5: Secondary | ICD-10-CM | POA: Diagnosis not present

## 2018-01-20 DIAGNOSIS — C9000 Multiple myeloma not having achieved remission: Secondary | ICD-10-CM | POA: Diagnosis not present

## 2018-01-20 DIAGNOSIS — I5032 Chronic diastolic (congestive) heart failure: Secondary | ICD-10-CM | POA: Diagnosis not present

## 2018-01-20 DIAGNOSIS — R0600 Dyspnea, unspecified: Secondary | ICD-10-CM | POA: Diagnosis not present

## 2018-01-20 DIAGNOSIS — D630 Anemia in neoplastic disease: Secondary | ICD-10-CM | POA: Diagnosis not present

## 2018-01-20 DIAGNOSIS — Z9484 Stem cells transplant status: Secondary | ICD-10-CM | POA: Diagnosis not present

## 2018-01-20 DIAGNOSIS — J449 Chronic obstructive pulmonary disease, unspecified: Secondary | ICD-10-CM | POA: Diagnosis not present

## 2018-01-20 DIAGNOSIS — R197 Diarrhea, unspecified: Secondary | ICD-10-CM | POA: Insufficient documentation

## 2018-01-20 DIAGNOSIS — R531 Weakness: Secondary | ICD-10-CM | POA: Diagnosis not present

## 2018-01-20 DIAGNOSIS — Z853 Personal history of malignant neoplasm of breast: Secondary | ICD-10-CM | POA: Insufficient documentation

## 2018-01-20 DIAGNOSIS — N289 Disorder of kidney and ureter, unspecified: Secondary | ICD-10-CM | POA: Diagnosis not present

## 2018-01-20 DIAGNOSIS — R918 Other nonspecific abnormal finding of lung field: Secondary | ICD-10-CM | POA: Diagnosis not present

## 2018-01-20 DIAGNOSIS — R530 Neoplastic (malignant) related fatigue: Secondary | ICD-10-CM | POA: Diagnosis not present

## 2018-01-20 LAB — CBC WITH DIFFERENTIAL/PLATELET
Abs Immature Granulocytes: 0.02 10*3/uL (ref 0.00–0.07)
Basophils Absolute: 0 10*3/uL (ref 0.0–0.1)
Basophils Relative: 0 %
Eosinophils Absolute: 0.1 10*3/uL (ref 0.0–0.5)
Eosinophils Relative: 2 %
HCT: 27 % — ABNORMAL LOW (ref 36.0–46.0)
Hemoglobin: 8.4 g/dL — ABNORMAL LOW (ref 12.0–15.0)
Immature Granulocytes: 1 %
Lymphocytes Relative: 22 %
Lymphs Abs: 0.7 10*3/uL (ref 0.7–4.0)
MCH: 34.7 pg — ABNORMAL HIGH (ref 26.0–34.0)
MCHC: 31.1 g/dL (ref 30.0–36.0)
MCV: 111.6 fL — ABNORMAL HIGH (ref 80.0–100.0)
Monocytes Absolute: 0.3 10*3/uL (ref 0.1–1.0)
Monocytes Relative: 11 %
Neutro Abs: 2 10*3/uL (ref 1.7–7.7)
Neutrophils Relative %: 64 %
Platelets: 86 10*3/uL — ABNORMAL LOW (ref 150–400)
RBC: 2.42 MIL/uL — ABNORMAL LOW (ref 3.87–5.11)
RDW: 16.4 % — ABNORMAL HIGH (ref 11.5–15.5)
WBC: 3.1 10*3/uL — ABNORMAL LOW (ref 4.0–10.5)
nRBC: 0 % (ref 0.0–0.2)

## 2018-01-20 LAB — COMPREHENSIVE METABOLIC PANEL
ALK PHOS: 101 U/L (ref 38–126)
ALT: 30 U/L (ref 0–44)
AST: 22 U/L (ref 15–41)
Albumin: 3.5 g/dL (ref 3.5–5.0)
Anion gap: 10 (ref 5–15)
BUN: 20 mg/dL (ref 8–23)
CALCIUM: 8.6 mg/dL — AB (ref 8.9–10.3)
CO2: 21 mmol/L — AB (ref 22–32)
Chloride: 107 mmol/L (ref 98–111)
Creatinine, Ser: 2.49 mg/dL — ABNORMAL HIGH (ref 0.44–1.00)
GFR calc Af Amer: 21 mL/min — ABNORMAL LOW (ref >60)
GFR calc non Af Amer: 18 mL/min — ABNORMAL LOW (ref >60)
Glucose, Bld: 114 mg/dL — ABNORMAL HIGH (ref 70–99)
Potassium: 4.4 mmol/L (ref 3.5–5.1)
Sodium: 138 mmol/L (ref 135–145)
Total Bilirubin: 0.8 mg/dL (ref 0.3–1.2)
Total Protein: 5.7 g/dL — ABNORMAL LOW (ref 6.5–8.1)

## 2018-01-20 LAB — URINALYSIS, ROUTINE W REFLEX MICROSCOPIC
BILIRUBIN URINE: NEGATIVE
Bacteria, UA: NONE SEEN
Glucose, UA: 50 mg/dL — AB
Ketones, ur: NEGATIVE mg/dL
Leukocytes, UA: NEGATIVE
Nitrite: NEGATIVE
Protein, ur: 30 mg/dL — AB
Specific Gravity, Urine: 1.014 (ref 1.005–1.030)
pH: 6 (ref 5.0–8.0)

## 2018-01-20 LAB — I-STAT CG4 LACTIC ACID, ED: Lactic Acid, Venous: 1.34 mmol/L (ref 0.5–1.9)

## 2018-01-20 LAB — C DIFFICILE QUICK SCREEN W PCR REFLEX
C DIFFICILE (CDIFF) TOXIN: NEGATIVE
C Diff antigen: NEGATIVE
C Diff interpretation: NOT DETECTED

## 2018-01-20 LAB — TROPONIN I: Troponin I: 0.11 ng/mL (ref <0.03)

## 2018-01-20 LAB — BRAIN NATRIURETIC PEPTIDE: B Natriuretic Peptide: 102.6 pg/mL — ABNORMAL HIGH (ref 0.0–100.0)

## 2018-01-20 MED ORDER — SODIUM CHLORIDE 0.9 % IV BOLUS
1000.0000 mL | Freq: Once | INTRAVENOUS | Status: AC
  Administered 2018-01-20: 1000 mL via INTRAVENOUS

## 2018-01-20 NOTE — ED Provider Notes (Addendum)
[facility] DEPT Provider Note   CSN: [id number] Arrival date & time: 01/20/18  1658     History   Chief Complaint Chief Complaint  Patient presents with  . Diarrhea  . Weakness  . Shortness of Breath  . cancer patieant    HPI Nancy Norris is a 78 y.o. female.  78yo F w/ PMH including multiple myeloma, COPD, breast CA, fibromyalgia, MI who p/w diarrhea. She began augmentin on 12/27 for a sinus infection and the same day she started it, she began having diarrhea. The diarrhea has continued for 10 days. She reports at least 10 non-bloody BMs per day. She did not take the antibiotic today. She had 1 episode of vomiting the first night but none since then. No abd pain, fevers, urinary symptoms. She reports generalized weakness.She started drinking pedialyte this morning. She reports SOB for at least 1 week, no associated cough, not related to exertion. She has chest tightness when she is feeling short of breath. She reports improvement when she uses albuterol. No sick contacts. She is currently on chemotherapy.   The history is provided by the patient.  Diarrhea  Weakness  Associated symptoms: diarrhea and shortness of breath   Shortness of Breath    Past Medical History:  Diagnosis Date  . Anemia   . Anginal pain ([place])    used NTG x 2 May 31 and 06/15/13   . Anxiety   . Arthritis   . B12 deficiency 12/04/2014  . Breast cancer ([place])   . Complication of anesthesia   . COPD (chronic obstructive pulmonary disease) ([place])   . Cryptococcal pneumonitis ([place]) 11/22/2015  . Depression   . Dizziness   . Dyspnea   . Fibromyalgia   . Fibromyalgia   . GERD (gastroesophageal reflux disease)   . Headache(784.0)   . Heart murmur   . Hemoptysis 10/21/2015  . History of blood transfusion    last one May 12   . Hx of cardiovascular stress test    LexiScan with low level exercise Myoview (02/2013): No ischemia, EF 72%; normal study  . Hx of echocardiogram      a.  Echocardiogram (12/26/2012): EF 43-15%, grade 1 diastolic dysfunction;   b.  Echocardiogram (02/2013): EF 55-60%, no WMA, trivial effusion  . Hyperkalemia   . Hyponatremia   . Hypotension   . Hypothyroidism   . Mucositis   . Multiple myeloma   . Myocardial infarction [facility])    in past, patient was unaware.   . Neuropathy   . Nodule of left lung 09/13/2015  . Pain in joint, pelvic region and thigh 07/07/2015  . Pneumonia    several  . PONV (postoperative nausea and vomiting) 2008   after mastestomy    Patient Active Problem List   Diagnosis Date Noted  . Port catheter in place 09/18/2016  . Cryptococcal pneumonitis ([place]) 11/22/2015  . Anemia in neoplastic disease 03/31/2015  . B12 deficiency 12/04/2014  . Thrombocytopenia ([place]) 12/04/2014  . Encounter for antineoplastic chemotherapy 06/21/2014  . CKD (chronic kidney disease) stage 5, GFR less than 15 ml/min (H[facility]) 04/02/2014  . Multiple myeloma ([place]) 12/02/2013  . Major depressive disorder, recurrent episode ([place]) 11/02/2013  . Medication management 09/30/2013  . Vitamin D deficiency 03/24/2013  . Mixed hyperlipidemia 02/24/2013  . COPD (chronic obstructive pulmonary disease) with emphysema ([place]) 02/20/2013  . GERD (gastroesophageal reflux disease) 01/20/2013  . HTN (hypertension) 01/14/2013  . Asthma, chronic 01/13/2013  . Hypothyroid 01/13/2013  .  Chronic diastolic heart failure ([place]) 01/13/2013  . HX: breast cancer 12/08/2012  . Hypercalcemia 12/02/2012    Past Surgical History:  Procedure Laterality Date  . ABDOMINAL HYSTERECTOMY  1981  . AV FISTULA PLACEMENT Left 06/19/2013   Procedure: CREATION OF LEFT ARM ARTERIOVENOUS (AV) FISTULA ;  Surgeon: Angelia Mould, MD;  Location: [place];  Service: Vascular;  Laterality: Left;  . BREAST RECONSTRUCTION    . BREAST SURGERY Right    reduction  . CATARACT EXTRACTION, BILATERAL    . CHOLECYSTECTOMY  1971  . COLONOSCOPY    . EYE SURGERY Bilateral    lens implant   . history of Port removal    . LUNG BIOPSY  10/21/2015  . MASTECTOMY Left 2008  . PORTACATH PLACEMENT  12/2012   has had 2  . RESECTION OF ARTERIOVENOUS FISTULA ANEURYSM Left 06/04/2017   Procedure: LIGATION ANEURYSM OF LEFT ARTERIOVENOUS FISTULA;  Surgeon: Angelia Mould, MD;  Location: [place];  Service: Vascular;  Laterality: Left;  . Status post stem cell transplant on September 28, 2008.       OB History   No obstetric history on file.      Home Medications    Prior to Admission medications   Medication Sig Start Date End Date Taking? Authorizing Provider  acetaminophen (TYLENOL) 500 MG tablet Take 1,000 mg by mouth daily as needed for moderate pain or headache.   Yes [provider]  albuterol (PROVENTIL HFA;VENTOLIN HFA) 108 (90 Base) MCG/ACT inhaler Inhale 1-2 puffs into the lungs every 6 (six) hours as needed for wheezing or shortness of breath (cough). 01/10/18  Yes McClanahan, Danton Sewer, NP  amoxicillin-clavulanate (AUGMENTIN) 875-125 MG tablet Take 1 tablet by mouth 2 (two) times daily. 10 days 01/10/18  Yes McClanahan, Kyra, NP  azelastine (ASTELIN) 0.1 % nasal spray Place 1 spray into both nostrils daily. Use in each nostril as directed 01/10/18  Yes McClanahan, Kyra, NP  buPROPion (WELLBUTRIN) 75 MG tablet Take 1 tablet (75 mg total) by mouth 2 (two) times daily. 05/29/17 05/29/18 Yes Liane Comber, NP  citalopram (CELEXA) 40 MG tablet Take 1 tablet daily for Mood 10/18/17  Yes [name], MD  cromolyn (OPTICROM) 4 % ophthalmic solution INSTILL 1 TO 2 DROPS IN AFFECTED EYE(S) 4 TIMES A DAY FOR ALLERGIES. 12/27/17  Yes Liane Comber, NP  dexamethasone (DECADRON) 4 MG tablet Take 4 mg by mouth. 5 tabs on day 1 of chemo 12/18/17  Yes [provider]  fluconazole (DIFLUCAN) 150 MG tablet Take one tablet at onset of symptoms and second tablet three days later for yeast. 01/10/18  Yes McClanahan, Kyra, NP  folic acid (FOLVITE) 1 MG tablet Take 1 tablet  (1 mg total) by mouth daily. 03/19/17 03/19/18 Yes Buriev, Arie Sabina, MD  gabapentin (NEURONTIN) 300 MG capsule Take 1 capsule (300 mg total) by mouth 3 (three) times daily. 11/27/17  Yes [name], MD  levocetirizine (XYZAL) 5 MG tablet Take 1 tablet (5 mg total) by mouth every evening. 01/10/18 01/11/19 Yes McClanahan, Kyra, NP  levothyroxine (SYNTHROID, LEVOTHROID) 150 MCG tablet TAKE 1 TABLET BY MOUTH DAILY. NEED OFFICE VISIT FOR FURTHER REFILLS. Patient taking differently: Take by mouth daily before breakfast. TAKE 1 TABLET BY MOUTH DAILY. NEED OFFICE VISIT FOR FURTHER REFILLS. 10/03/17  Yes [name], MD  loperamide (IMODIUM) 2 MG capsule Take 1 capsule (2 mg total) by mouth as needed for diarrhea or loose stools. 02/28/16  Yes [name], MD  loratadine (CLARITIN) 10  MG tablet Take 10 mg by mouth daily.   Yes [provider]  magic mouthwash SOLN Take 5 mLs by mouth 4 (four) times daily as needed for mouth pain. 10/27/15  Yes Curt Bears, MD  midodrine (PROAMATINE) 10 MG tablet Take 1 tablet daily to support BP 12/03/17  Yes [name], MD  montelukast (SINGULAIR) 10 MG tablet Take 1 tablet daily for Allergies 10/18/17  Yes [name], MD  Multiple Vitamins-Minerals (PRESERVISION AREDS 2) CAPS Take 1 capsule by mouth daily.   Yes [provider]  nystatin (MYCOSTATIN/NYSTOP) powder Apply topically 2 (two) times daily. 01/10/18  Yes McClanahan, Kyra, NP  ondansetron (ZOFRAN ODT) 8 MG disintegrating tablet Take 1 tablet (8 mg total) by mouth every 8 (eight) hours as needed for nausea or vomiting. 12/17/17  Yes Curt Bears, MD  pilocarpine (SALAGEN) 5 MG tablet TAKE 1 TABLET BY MOUTH 3 TIMES DAILY FOR DRY MOUTH. Patient taking differently: TAKE 1 TABLET BY MOUTH 2 TIMES DAILY FOR DRY MOUTH. 09/26/16  Yes [name], MD  ranitidine (ZANTAC) 300 MG tablet Take 1 tablet 2 x /day at [facility] & Bedtime for Heart burn & Reflux 09/16/17  Yes [name], MD  senna (SENOKOT) 8.6 MG tablet Take 1 tablet by mouth daily as needed for constipation.    Yes [provider]  sucralfate (CARAFATE) 1 g tablet Take 1 tablet dissolved in water  4 x /day BEFORE Meals & Bedtime for Acid indigestion & Reflux 10/04/17  Yes [name], MD  umeclidinium-vilanterol (ANORO ELLIPTA) 62.5-25 MCG/INH AEPB Inhale 1 puff into the lungs daily as needed (shortness of breath). 01/10/18  Yes McClanahan, Danton Sewer, NP  vitamin B-12 (CYANOCOBALAMIN) 1000 MCG tablet Take 1 tablet (1,000 mcg total) by mouth daily. Patient taking differently: Take 1,000 mcg by mouth 2 (two) times daily.  03/19/17  Yes Buriev, Arie Sabina, MD  furosemide (LASIX) 40 MG tablet TAKE 1 TABLET BY MOUTH DAILY. Patient not taking: Reported on 01/20/2018 12/03/16   [name], MD  ipratropium-albuterol (DUONEB) 0.5-2.5 (3) MG/3ML SOLN Take 3 mLs by nebulization every 4 (four) hours as needed. Max:6 doses per day 01/10/18   Garnet Sierras, NP  nitroGLYCERIN (NITROSTAT) 0.4 MG SL tablet Place 0.4 mg under the tongue every 5 (five) minutes as needed for chest pain.     [provider]  traMADol (ULTRAM) 50 MG tablet Take 1 to 2 tablets by mouth 4 times a day as needed for pain. Patient not taking: Reported on 01/20/2018 07/22/15   [name], MD    Family History Family History  Problem Relation Age of Onset  . Arthritis Mother   . Asthma Mother   . Cancer Sister   . Hyperlipidemia Brother     Social History Social History   Tobacco Use  . Smoking status: Former Smoker    Packs/day: 1.00    Years: 30.00    Pack years: 30.00    Types: Cigarettes    Last attempt to quit: 02/15/2005    Years since quitting: 12.9  . Smokeless tobacco: Never Used  Substance Use Topics  . Alcohol use: No  . Drug use: No     Allergies   Codeine; Latex; Chocolate; Onion; Oxycodone; Zyprexa [olanzapine]; Adhesive [tape]; Heparin; Hydrocodone; Iodinated diagnostic agents;  Promethazine-dm; and Sulfa antibiotics   Review of Systems Review of Systems  Respiratory: Positive for shortness of breath.   Gastrointestinal: Positive for diarrhea.  Neurological: Positive for weakness.   All other systems reviewed  and are negative except that which was mentioned in HPI   Physical Exam Updated Vital Signs BP 132/68 (BP Location: Left Arm)   Pulse 74   Temp 98.1 F (36.7 C) (Oral)   Resp 16   Ht '5\' 5"'$  (1.651 m)   Wt 78.9 kg   SpO2 100%   BMI 28.96 kg/m   Physical Exam Vitals signs and nursing note reviewed.  Constitutional:      General: She is not in acute distress.    Appearance: She is well-developed.  HENT:     Head: Normocephalic and atraumatic.  Eyes:     Conjunctiva/sclera: Conjunctivae normal.  Neck:     Musculoskeletal: Neck supple.  Cardiovascular:     Rate and Rhythm: Normal rate and regular rhythm.     Heart sounds: Normal heart sounds. No murmur.  Pulmonary:     Effort: Pulmonary effort is normal.     Comments: Mildly diminished b/l, no wheezes Abdominal:     General: Bowel sounds are normal. There is no distension.     Palpations: Abdomen is soft.     Tenderness: There is no abdominal tenderness.  Musculoskeletal:     Right lower leg: No edema.     Left lower leg: No edema.  Skin:    General: Skin is warm and dry.  Neurological:     Mental Status: She is alert and oriented to person, place, and time.     Comments: Fluent speech  Psychiatric:        Judgment: Judgment normal.      ED Treatments / Results  Labs (all labs ordered are listed, but only abnormal results are displayed) Labs Reviewed  COMPREHENSIVE METABOLIC PANEL - Abnormal; Notable for the following components:      Result Value   CO2 21 (*)    Glucose, Bld 114 (*)    Creatinine, Ser 2.49 (*)    Calcium 8.6 (*)    Total Protein 5.7 (*)    GFR calc non Af Amer 18 (*)    GFR calc Af Amer 21 (*)    All other components within normal limits  CBC WITH  DIFFERENTIAL/PLATELET - Abnormal; Notable for the following components:   WBC 3.1 (*)    RBC 2.42 (*)    Hemoglobin 8.4 (*)    HCT 27.0 (*)    MCV 111.6 (*)    MCH 34.7 (*)    RDW 16.4 (*)    Platelets 86 (*)    All other components within normal limits  URINALYSIS, ROUTINE W REFLEX MICROSCOPIC - Abnormal; Notable for the following components:   Glucose, UA 50 (*)    Hgb urine dipstick SMALL (*)    Protein, ur 30 (*)    All other components within normal limits  TROPONIN I - Abnormal; Notable for the following components:   Troponin I 0.11 (*)    All other components within normal limits  BRAIN NATRIURETIC PEPTIDE - Abnormal; Notable for the following components:   B Natriuretic Peptide 102.6 (*)    All other components within normal limits  TROPONIN I - Abnormal; Notable for the following components:   Troponin I 0.11 (*)    All other components within normal limits  C DIFFICILE QUICK SCREEN W PCR REFLEX  GASTROINTESTINAL PANEL BY PCR, STOOL (REPLACES STOOL CULTURE)  I-STAT CG4 LACTIC ACID, ED  I-STAT CG4 LACTIC ACID, ED    EKG None Sinus rhythm, rate 80, no ischemic changes  Radiology Dg Chest  2 View  Result Date: 01/20/2018 CLINICAL DATA:  Shortness of breath for 1 week. Active treatment for multiple myeloma. EXAM: CHEST - 2 VIEW COMPARISON:  CT 03/17/2017 FINDINGS: Tip of the right chest port tip the atrial caval junction. Chronic eventration of right hemidiaphragm. Linear scarring in the left mid lung. Small nodular opacities in the right upper lung zone, with nodules seen is region on prior CT. Unchanged heart size and mediastinal contours with aortic atherosclerosis. No acute airspace disease, pleural effusion, or pneumothorax. No acute osseous abnormalities. IMPRESSION: 1. No acute findings. 2. Small nodular opacities in the right upper lung zone, corresponding to nodules seen on March 2019 CT. Electronically Signed   By: Keith Rake M.D.   On: 01/20/2018 20:30    Ct Chest Wo Contrast  Result Date: 01/20/2018 CLINICAL DATA:  Dyspnea, diarrhea and weakness times 10 days. EXAM: CT CHEST WITHOUT CONTRAST TECHNIQUE: Multidetector CT imaging of the chest was performed following the standard protocol without IV contrast. COMPARISON:  03/17/2017 chest CT and 01/10/2016 chest CT FINDINGS: Cardiovascular: Top normal heart size with trace anterior pericardial effusion. Moderate three-vessel coronary arteriosclerosis is redemonstrated. Dilatation of the main pulmonary artery to 3.1 cm compatible with chronic mild pulmonary hypertension noted. Nonaneurysmal atherosclerotic aorta calcifications noted at the arch and descending portion. Mediastinum/Nodes: No lymphadenopathy of the included base of neck, axilla, mediastinum nor hila. Hilar adenopathy is limited due to lack IV contrast. No thyromegaly or mass. Patent trachea and mainstem bronchi. The CT appearance of the esophagus is unremarkable. Lungs/Pleura: Lingular bandlike scarring is redemonstrated. Right upper lobe, somewhat circumferential opacity with central lucency is noted, series 5/41 though on coronal reformats this may be due to imaging of linear scarring and/or atelectasis in the axial plane simulating a lesion, series 6/62 of the coronal images and series 5/40 the axial images. Findings are new since prior and may represent postinfectious or inflammatory change. Attention follow-up is recommended to exclude other etiologies including neoplasm. Tiny subpleural nodule in the right middle lobe is stable likely postinfectious or inflammatory measuring 3 mm. Additional subpleural right lower lobe lateral basal segment nodule measuring approximately 2 mm, series 05/18. Subsegmental atelectasis is seen at each base. No effusion or pneumothorax. Upper Abdomen: Bilateral renal cortical scarring. Stable hypodense left adrenal nodule measuring 18 mm compatible a benign adenoma. Right adrenal gland is normal. No space-occupying  mass of the included liver. Mild pancreatic fatty infiltration. Normal size included spleen. Musculoskeletal: No aggressive osseous lesions. No acute fracture. IMPRESSION: 1. Moderate three-vessel coronary arteriosclerosis. 2. Dilatation of the main pulmonary artery to 3.1 cm compatible with chronic pulmonary hypertension. 3. Right upper lobe circumferential opacity with central lucency may represent postinfectious or inflammatory change. Attention to this area on follow-up is recommended and to exclude neoplasm. On coronal reformats this appears more linear less masslike and is more likely related to postinfectious or inflammatory change. 4. Chronic lingular scarring and/or atelectasis. 5. Stable few mm right middle and lower lobe nodular densities. 6. Stable left adrenal adenoma measuring 18 mm. 7. Bilateral renal cortical scarring. Aortic Atherosclerosis (ICD10-I70.0). Electronically Signed   By: Ashley Royalty M.D.   On: 01/20/2018 22:29    Procedures Procedures (including critical care time)  Medications Ordered in ED Medications  sodium chloride 0.9 % bolus 1,000 mL (0 mLs Intravenous Stopped 01/20/18 2049)     Initial Impression / Assessment and Plan / ED Course  I have reviewed the triage vital signs and the nursing notes.  Pertinent labs that were  available during my care of the patient were reviewed by me and considered in my medical decision making (see chart for details).    Non-toxic on exam, normal VS. No obvious wheezing on exam. Labs show stable CKD at 2.5, pancytopenia similar to previous values, trop 0.11, stable at 3 hours. Previous trops are mildly elevated. C diff negative. CXR stable.   I discussed the possibility of PE and because of her CKD we cannot obtain CTA.  I offered admission for trending of troponins and obtaining VQ scan however patient declined.  She states that she would rather discuss these problems with her oncologist Dr. Earlie Server.  She understands risks of leaving  without completion of work-up. Her VS have remained stable and she has had no diarrhea here.  I have emphasized the importance of following up with Dr. Julien Nordmann this week and she will also contact her cardiologist for follow-up appointment.  I have extensively reviewed return precautions and she voiced understanding. Final Clinical Impressions(s) / ED Diagnoses   Final diagnoses:  Shortness of breath  Diarrhea, unspecified type    ED Discharge Orders    None       Ahjanae Cassel, Wenda Overland, MD 01/21/18 0028    Rex Kras, Wenda [place], MD 01/27/18 1559

## 2018-01-20 NOTE — Telephone Encounter (Signed)
Patient states that she has had diarrhea for about 3 days. Has taken imodium. Daughter suggested pedialyte, says imodium is not working. Feels really weak. Asking for suggestions

## 2018-01-20 NOTE — ED Triage Notes (Signed)
Patient c/o SOB, diarrhea, and weakness x 10 days.

## 2018-01-20 NOTE — ED Notes (Signed)
Date and time results received: 01/20/18 2026 (use smartphrase ".now" to insert current time)  Test: Troponin  Critical Value: 0.11  Name of Provider Notified: Dr. Rex Kras  Orders Received? Or Actions Taken?: Actions Taken: notified Dr Rex Kras of troponin 0.11.

## 2018-01-20 NOTE — Telephone Encounter (Signed)
After speaking with patient, it was clarified that patient has had watery diarrhea for 11 days. Provider aware and was advised to go to the ER.

## 2018-01-21 DIAGNOSIS — R0602 Shortness of breath: Secondary | ICD-10-CM | POA: Diagnosis not present

## 2018-01-21 LAB — GASTROINTESTINAL PANEL BY PCR, STOOL (REPLACES STOOL CULTURE)

## 2018-01-21 LAB — TROPONIN I: Troponin I: 0.11 ng/mL (ref <0.03)

## 2018-01-21 MED ORDER — HEPARIN SOD (PORK) LOCK FLUSH 100 UNIT/ML IV SOLN
500.0000 [IU] | Freq: Once | INTRAVENOUS | Status: AC
  Administered 2018-01-21: 500 [IU]
  Filled 2018-01-21: qty 5

## 2018-01-21 NOTE — Discharge Instructions (Addendum)
Stop taking augmentin Start taking probiotics daily. Follow up with Dr. Julien Nordmann to discuss possible V/Q scan.  Return to ER immediately if any of your symptoms worsen.

## 2018-01-23 ENCOUNTER — Inpatient Hospital Stay (HOSPITAL_BASED_OUTPATIENT_CLINIC_OR_DEPARTMENT_OTHER): Payer: Medicare Other | Admitting: Internal Medicine

## 2018-01-23 ENCOUNTER — Telehealth: Payer: Self-pay | Admitting: Internal Medicine

## 2018-01-23 ENCOUNTER — Inpatient Hospital Stay: Payer: Medicare Other

## 2018-01-23 VITALS — BP 132/77 | HR 92 | Temp 99.9°F | Resp 20 | Ht 65.0 in | Wt 180.5 lb

## 2018-01-23 DIAGNOSIS — Z86000 Personal history of in-situ neoplasm of breast: Secondary | ICD-10-CM | POA: Diagnosis not present

## 2018-01-23 DIAGNOSIS — C9000 Multiple myeloma not having achieved remission: Secondary | ICD-10-CM

## 2018-01-23 DIAGNOSIS — D630 Anemia in neoplastic disease: Secondary | ICD-10-CM | POA: Diagnosis not present

## 2018-01-23 DIAGNOSIS — R531 Weakness: Secondary | ICD-10-CM

## 2018-01-23 DIAGNOSIS — C9002 Multiple myeloma in relapse: Secondary | ICD-10-CM

## 2018-01-23 DIAGNOSIS — Z9484 Stem cells transplant status: Secondary | ICD-10-CM | POA: Diagnosis not present

## 2018-01-23 DIAGNOSIS — N289 Disorder of kidney and ureter, unspecified: Secondary | ICD-10-CM

## 2018-01-23 DIAGNOSIS — Z5111 Encounter for antineoplastic chemotherapy: Secondary | ICD-10-CM

## 2018-01-23 DIAGNOSIS — Z9221 Personal history of antineoplastic chemotherapy: Secondary | ICD-10-CM

## 2018-01-23 DIAGNOSIS — R530 Neoplastic (malignant) related fatigue: Secondary | ICD-10-CM

## 2018-01-23 DIAGNOSIS — D696 Thrombocytopenia, unspecified: Secondary | ICD-10-CM

## 2018-01-23 DIAGNOSIS — I10 Essential (primary) hypertension: Secondary | ICD-10-CM

## 2018-01-23 DIAGNOSIS — N185 Chronic kidney disease, stage 5: Secondary | ICD-10-CM

## 2018-01-23 LAB — CMP (CANCER CENTER ONLY)
ALT: 26 U/L (ref 0–44)
AST: 19 U/L (ref 15–41)
Albumin: 3.1 g/dL — ABNORMAL LOW (ref 3.5–5.0)
Alkaline Phosphatase: 120 U/L (ref 38–126)
Anion gap: 9 (ref 5–15)
BUN: 24 mg/dL — ABNORMAL HIGH (ref 8–23)
CO2: 23 mmol/L (ref 22–32)
Calcium: 8.9 mg/dL (ref 8.9–10.3)
Chloride: 107 mmol/L (ref 98–111)
Creatinine: 2.74 mg/dL — ABNORMAL HIGH (ref 0.44–1.00)
GFR, EST NON AFRICAN AMERICAN: 16 mL/min — AB (ref >60)
GFR, Est AFR Am: 19 mL/min — ABNORMAL LOW (ref >60)
Glucose, Bld: 125 mg/dL — ABNORMAL HIGH (ref 70–99)
Potassium: 4.8 mmol/L (ref 3.5–5.1)
Sodium: 139 mmol/L (ref 135–145)
Total Bilirubin: 1 mg/dL (ref 0.3–1.2)
Total Protein: 5.7 g/dL — ABNORMAL LOW (ref 6.5–8.1)

## 2018-01-23 LAB — CBC WITH DIFFERENTIAL (CANCER CENTER ONLY)
Abs Immature Granulocytes: 0.02 10*3/uL (ref 0.00–0.07)
Basophils Absolute: 0 10*3/uL (ref 0.0–0.1)
Basophils Relative: 0 %
Eosinophils Absolute: 0.1 10*3/uL (ref 0.0–0.5)
Eosinophils Relative: 2 %
HCT: 27.7 % — ABNORMAL LOW (ref 36.0–46.0)
Hemoglobin: 8.7 g/dL — ABNORMAL LOW (ref 12.0–15.0)
IMMATURE GRANULOCYTES: 1 %
Lymphocytes Relative: 29 %
Lymphs Abs: 1.1 10*3/uL (ref 0.7–4.0)
MCH: 34.5 pg — ABNORMAL HIGH (ref 26.0–34.0)
MCHC: 31.4 g/dL (ref 30.0–36.0)
MCV: 109.9 fL — ABNORMAL HIGH (ref 80.0–100.0)
Monocytes Absolute: 0.4 10*3/uL (ref 0.1–1.0)
Monocytes Relative: 11 %
NEUTROS PCT: 57 %
Neutro Abs: 2.2 10*3/uL (ref 1.7–7.7)
PLATELETS: 92 10*3/uL — AB (ref 150–400)
RBC: 2.52 MIL/uL — ABNORMAL LOW (ref 3.87–5.11)
RDW: 16.8 % — ABNORMAL HIGH (ref 11.5–15.5)
WBC Count: 3.8 10*3/uL — ABNORMAL LOW (ref 4.0–10.5)
nRBC: 0 % (ref 0.0–0.2)

## 2018-01-23 MED ORDER — DEXTROSE 5 % IV SOLN
27.0000 mg/m2 | Freq: Once | INTRAVENOUS | Status: AC
  Administered 2018-01-23: 50 mg via INTRAVENOUS
  Filled 2018-01-23: qty 10

## 2018-01-23 MED ORDER — DEXAMETHASONE SODIUM PHOSPHATE 10 MG/ML IJ SOLN
INTRAMUSCULAR | Status: AC
  Filled 2018-01-23: qty 1

## 2018-01-23 MED ORDER — SODIUM CHLORIDE 0.9 % IJ SOLN
10.0000 mL | INTRAMUSCULAR | Status: DC | PRN
  Administered 2018-01-23: 10 mL
  Filled 2018-01-23: qty 10

## 2018-01-23 MED ORDER — ONDANSETRON HCL 8 MG PO TABS
ORAL_TABLET | ORAL | Status: AC
  Filled 2018-01-23: qty 1

## 2018-01-23 MED ORDER — ONDANSETRON HCL 8 MG PO TABS
8.0000 mg | ORAL_TABLET | Freq: Once | ORAL | Status: AC
  Administered 2018-01-23: 8 mg via ORAL

## 2018-01-23 MED ORDER — SODIUM CHLORIDE 0.9 % IV SOLN
Freq: Once | INTRAVENOUS | Status: DC
  Filled 2018-01-23: qty 250

## 2018-01-23 MED ORDER — SODIUM CHLORIDE 0.9 % IV SOLN
225.0000 mg/m2 | Freq: Once | INTRAVENOUS | Status: AC
  Administered 2018-01-23: 420 mg via INTRAVENOUS
  Filled 2018-01-23: qty 21

## 2018-01-23 MED ORDER — HEPARIN SOD (PORK) LOCK FLUSH 100 UNIT/ML IV SOLN
500.0000 [IU] | Freq: Once | INTRAVENOUS | Status: AC | PRN
  Administered 2018-01-23: 500 [IU]
  Filled 2018-01-23: qty 5

## 2018-01-23 MED ORDER — DEXAMETHASONE SODIUM PHOSPHATE 10 MG/ML IJ SOLN
10.0000 mg | Freq: Once | INTRAMUSCULAR | Status: AC
  Administered 2018-01-23: 10 mg via INTRAVENOUS

## 2018-01-23 MED ORDER — SODIUM CHLORIDE 0.9 % IV SOLN
Freq: Once | INTRAVENOUS | Status: AC
  Administered 2018-01-23: 13:00:00 via INTRAVENOUS
  Filled 2018-01-23: qty 250

## 2018-01-23 NOTE — Progress Notes (Signed)
Ok to treat with 1/6 CMET per MD Julien Nordmann

## 2018-01-23 NOTE — Progress Notes (Signed)
[place] Telephone:([phone]) [phone]   Fax:(336) [phone]  OFFICE PROGRESS NOTE  [name], [address] [place] [address]  DIAGNOSIS:  1. 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 Recurrent multiple myeloma initially diagnosed as a smoldering myeloma at [facility] in 2002. 3. Ductal carcinoma in situ status post mastectomy with sentinel lymph node biopsy in October 2008.  PRIOR THERAPY: 1. Status post treatment with tamoxifen from November 2008 through February 2009, discontinued secondary to intolerance. 2. Status post 3 cycles of chemotherapy with Revlimid and Decadron followed by 1 cycle of Decadron only with mild response. 3. Status post 2 cycles of systemic chemotherapy with Velcade, Doxil and Decadron discontinued secondary to significant peripheral neuropathy. Last dose was given May 2010 at [facility]. 4. Status post autologous peripheral blood stem cell transplant on October 01, 2008 at [facility] under the care of Dr. Phyllis Ginger.  5. Systemic chemotherapy with Carfilzomib initially was 20 mg/M2 and will be increased after cycle #1 to 36 mg/M2 on days 1, 2, 8, 9, 15 and 16 every 4 weeks in addition to Cytoxan 300 mg/M2 and Decadron 40 mg by mouth weekly basis, status post 4 cycles. First cycle on 12/29/2012. 6. She resumed chemotherapy with Carfilzomib, Cytoxan, and Decadron on 05/03/2014. Status post 6 cycles. 7. systemic chemotherapy again with Carfilzomib, Cytoxan and Decadron on 07/11/2015. Status post 3 cycles. 8. She completed a course of treatment with Diflucan 200 mg by mouth daily for around 6 weeks for cryptococcal infection of the lung.  CURRENT THERAPY: Resuming treatment again with Carfilzomib, Cytoxan and Decadron, first cycle 04/23/2016. Status post 8 cycles.  Treatment is currently on hold since December 2018.  The patient will resume her treatment today December 25, 2017.  INTERVAL HISTORY: Nancy Norris 78 y.o. female returns to the clinic today for follow-up visit.  The patient is feeling fine today except for generalized fatigue and weakness.  She was seen recently at the emergency department complaining of persistent diarrhea as well as shortness of breath and weakness.  She was given IV hydration before discharge.  She is feeling a little bit better today.  She had CT scan of the chest without contrast during her evaluation and that showed right upper lobe circumferential opacity with central lucency suspicious for postinfectious or inflammatory changes.  The patient denied having any current fever or chills.  She has no nausea, vomiting, diarrhea or constipation.  She denied having any current chest pain but has shortness of breath with exertion with no cough or hemoptysis.  She tolerated the last cycle of her chemotherapy fairly well.  She is here today for evaluation before starting cycle #9.  MEDICAL HISTORY: Past Medical History:  Diagnosis Date  . Anemia   . Anginal pain ([place])    used NTG x 2 May 31 and 06/15/13   . Anxiety   . Arthritis   . B12 deficiency 12/04/2014  . Breast cancer ([place])   . Complication of anesthesia   . COPD (chronic obstructive pulmonary disease) ([place])   . Cryptococcal pneumonitis ([place]) 11/22/2015  . Depression   . Dizziness   . Dyspnea   . Fibromyalgia   . Fibromyalgia   . GERD (gastroesophageal reflux disease)   . Headache(784.0)   . Heart murmur   . Hemoptysis 10/21/2015  . History of blood transfusion    last one May 12   .  Hx of cardiovascular stress test    LexiScan with low level exercise Myoview (02/2013): No ischemia, EF 72%; normal study  . Hx of echocardiogram    a.  Echocardiogram (12/26/2012): EF 51-88%, grade 1 diastolic dysfunction;   b.  Echocardiogram (02/2013): EF 55-60%, no WMA, trivial effusion  . Hyperkalemia   . Hyponatremia   . Hypotension   . Hypothyroidism   . Mucositis   . Multiple  myeloma   . Myocardial infarction [facility])    in past, patient was unaware.   . Neuropathy   . Nodule of left lung 09/13/2015  . Pain in joint, pelvic region and thigh 07/07/2015  . Pneumonia    several  . PONV (postoperative nausea and vomiting) 2008   after mastestomy    ALLERGIES:  is allergic to codeine; latex; chocolate; onion; oxycodone; zyprexa [olanzapine]; adhesive [tape]; heparin; hydrocodone; iodinated diagnostic agents; promethazine-dm; and sulfa antibiotics.  MEDICATIONS:    SURGICAL HISTORY:  Past Surgical History:  Procedure Laterality Date  . ABDOMINAL HYSTERECTOMY  1981  . AV FISTULA PLACEMENT Left 06/19/2013   Procedure: CREATION OF LEFT ARM ARTERIOVENOUS (AV) FISTULA ;  Surgeon: Angelia Mould, MD;  Location: [place];  Service: Vascular;  Laterality: Left;  . BREAST RECONSTRUCTION    . BREAST SURGERY Right    reduction  . CATARACT EXTRACTION, BILATERAL    . CHOLECYSTECTOMY  1971  . COLONOSCOPY    . EYE SURGERY Bilateral    lens implant  . history of Port removal    . LUNG BIOPSY  10/21/2015  . MASTECTOMY Left 2008  . PORTACATH PLACEMENT  12/2012   has had 2  . RESECTION OF ARTERIOVENOUS FISTULA ANEURYSM Left 06/04/2017   Procedure: LIGATION ANEURYSM OF LEFT ARTERIOVENOUS FISTULA;  Surgeon: Angelia Mould, MD;  Location: [place];  Service: Vascular;  Laterality: Left;  . Status post stem cell transplant on September 28, 2008.      REVIEW OF SYSTEMS:  A comprehensive review of systems was negative except for: Constitutional: positive for fatigue Respiratory: positive for dyspnea on exertion   PHYSICAL EXAMINATION: General appearance: alert, cooperative, fatigued and no distress Head: Normocephalic, without obvious abnormality, atraumatic Neck: no adenopathy, no JVD, supple, symmetrical, trachea midline and thyroid not enlarged, symmetric, no tenderness/mass/nodules Lymph nodes: Cervical, supraclavicular, and axillary nodes normal. Resp: clear to  auscultation bilaterally Back: symmetric, no curvature. ROM normal. No CVA tenderness. Cardio: regular rate and rhythm, S1, S2 normal, no murmur, click, rub or gallop GI: soft, non-tender; bowel sounds normal; no masses,  no organomegaly Extremities: extremities normal, atraumatic, no cyanosis or edema  ECOG PERFORMANCE STATUS: 1 - Symptomatic but completely ambulatory  Blood pressure 132/77, pulse 92, temperature 99.9 F (37.7 C), temperature source Oral, resp. rate 20, height _0  (1.651 m), weight 180 lb 8 oz (81.9 kg), SpO2 99 %.  LABORATORY DATA: Lab Results  Component Value Date   WBC 3.8 (L) 01/23/2018   HGB 8.7 (L) 01/23/2018   HCT 27.7 (L) 01/23/2018   MCV 109.9 (H) 01/23/2018   PLT 92 (L) 01/23/2018      Chemistry      Component Value Date/Time   NA 138 01/20/2018 1949   NA 144 01/17/2017 1146   K 4.4 01/20/2018 1949   K 3.7 01/17/2017 1146   CL 107 01/20/2018 1949   CL 105 03/19/2012 0811   CO2 21 (L) 01/20/2018 1949   CO2 25 01/17/2017 1146   BUN 20 01/20/2018 1949   BUN  35.0 (H) 01/17/2017 1146   CREATININE 2.49 (H) 01/20/2018 1949   CREATININE 2.52 (H) 01/16/2018 1421   CREATININE 3.16 (H) 12/18/2017 1137   CREATININE 2.8 (H) 01/17/2017 1146      Component Value Date/Time   CALCIUM 8.6 (L) 01/20/2018 1949   CALCIUM 9.1 01/17/2017 1146   ALKPHOS 101 01/20/2018 1949   ALKPHOS 110 01/17/2017 1146   AST 22 01/20/2018 1949   AST 81 (H) 01/16/2018 1421   AST 20 01/17/2017 1146   ALT 30 01/20/2018 1949   ALT 93 (H) 01/16/2018 1421   ALT 22 01/17/2017 1146   BILITOT 0.8 01/20/2018 1949   BILITOT 1.2 01/16/2018 1421   BILITOT 0.73 01/17/2017 1146       RADIOGRAPHIC STUDIES: Dg Chest 2 View  Result Date: 01/20/2018 CLINICAL DATA:  Shortness of breath for 1 week. Active treatment for multiple myeloma. EXAM: CHEST - 2 VIEW COMPARISON:  CT 03/17/2017 FINDINGS: Tip of the right chest port tip the atrial caval junction. Chronic eventration of right  hemidiaphragm. Linear scarring in the left mid lung. Small nodular opacities in the right upper lung zone, with nodules seen is region on prior CT. Unchanged heart size and mediastinal contours with aortic atherosclerosis. No acute airspace disease, pleural effusion, or pneumothorax. No acute osseous abnormalities. IMPRESSION: 1. No acute findings. 2. Small nodular opacities in the right upper lung zone, corresponding to nodules seen on March 2019 CT. Electronically Signed   By: Keith Rake M.D.   On: 01/20/2018 20:30   Ct Chest Wo Contrast  Result Date: 01/20/2018 CLINICAL DATA:  Dyspnea, diarrhea and weakness times 10 days. EXAM: CT CHEST WITHOUT CONTRAST TECHNIQUE: Multidetector CT imaging of the chest was performed following the standard protocol without IV contrast. COMPARISON:  03/17/2017 chest CT and 01/10/2016 chest CT FINDINGS: Cardiovascular: Top normal heart size with trace anterior pericardial effusion. Moderate three-vessel coronary arteriosclerosis is redemonstrated. Dilatation of the main pulmonary artery to 3.1 cm compatible with chronic mild pulmonary hypertension noted. Nonaneurysmal atherosclerotic aorta calcifications noted at the arch and descending portion. Mediastinum/Nodes: No lymphadenopathy of the included base of neck, axilla, mediastinum nor hila. Hilar adenopathy is limited due to lack IV contrast. No thyromegaly or mass. Patent trachea and mainstem bronchi. The CT appearance of the esophagus is unremarkable. Lungs/Pleura: Lingular bandlike scarring is redemonstrated. Right upper lobe, somewhat circumferential opacity with central lucency is noted, series 5/41 though on coronal reformats this may be due to imaging of linear scarring and/or atelectasis in the axial plane simulating a lesion, series 6/62 of the coronal images and series 5/40 the axial images. Findings are new since prior and may represent postinfectious or inflammatory change. Attention follow-up is recommended to  exclude other etiologies including neoplasm. Tiny subpleural nodule in the right middle lobe is stable likely postinfectious or inflammatory measuring 3 mm. Additional subpleural right lower lobe lateral basal segment nodule measuring approximately 2 mm, series 05/18. Subsegmental atelectasis is seen at each base. No effusion or pneumothorax. Upper Abdomen: Bilateral renal cortical scarring. Stable hypodense left adrenal nodule measuring 18 mm compatible a benign adenoma. Right adrenal gland is normal. No space-occupying mass of the included liver. Mild pancreatic fatty infiltration. Normal size included spleen. Musculoskeletal: No aggressive osseous lesions. No acute fracture. IMPRESSION: 1. Moderate three-vessel coronary arteriosclerosis. 2. Dilatation of the main pulmonary artery to 3.1 cm compatible with chronic pulmonary hypertension. 3. Right upper lobe circumferential opacity with central lucency may represent postinfectious or inflammatory change. Attention to this area on follow-up  is recommended and to exclude neoplasm. On coronal reformats this appears more linear less masslike and is more likely related to postinfectious or inflammatory change. 4. Chronic lingular scarring and/or atelectasis. 5. Stable few mm right middle and lower lobe nodular densities. 6. Stable left adrenal adenoma measuring 18 mm. 7. Bilateral renal cortical scarring. Aortic Atherosclerosis (ICD10-I70.0). Electronically Signed   By: Ashley Royalty M.D.   On: 01/20/2018 22:29    ASSESSMENT AND PLAN:  This is a very pleasant 78 years old white female with relapsed multiple myeloma status post several chemotherapy regimens and she is currently on treatment with systemic chemotherapy with Carfilzomib, Cytoxan and Decadron status post 7 cycles.  She has been off treatment for the last 12 months. She resumed her treatment again with cycle #8 a month ago.  She tolerated this cycle well with no concerning complaints except for fatigue. I  recommended for her to proceed with cycle #9 today as scheduled. For the renal insufficiency she will continue her current follow-up visit and evaluation by Dr. Marval Regal. I will see the patient back for follow-up visit in 4 weeks for evaluation before starting cycle #10. The patient was advised to call immediately if she has any concerning symptoms in the interval. The patient voices understanding of current disease status and treatment options and is in agreement with the current care plan. All questions were answered. The patient knows to call the clinic with any problems, questions or concerns. We can certainly see the patient much sooner if necessary.  Disclaimer: This note was dictated with voice recognition software. Similar sounding words can inadvertently be transcribed and may not be corrected upon review.

## 2018-01-23 NOTE — Progress Notes (Signed)
OK to treat-Per Dr Julien Nordmann it is okay to treat pt today with Cytoxan and Kyprolis and todays platelet count and creatinine.

## 2018-01-23 NOTE — Telephone Encounter (Signed)
Added another cycle per 1/9 los - pt to get an updated schedule next visit.

## 2018-01-24 ENCOUNTER — Encounter: Payer: Self-pay | Admitting: Internal Medicine

## 2018-01-24 ENCOUNTER — Inpatient Hospital Stay: Payer: Medicare Other

## 2018-01-24 VITALS — BP 130/65 | HR 83 | Temp 99.6°F | Resp 18

## 2018-01-24 DIAGNOSIS — D630 Anemia in neoplastic disease: Secondary | ICD-10-CM | POA: Diagnosis not present

## 2018-01-24 DIAGNOSIS — R531 Weakness: Secondary | ICD-10-CM | POA: Diagnosis not present

## 2018-01-24 DIAGNOSIS — N289 Disorder of kidney and ureter, unspecified: Secondary | ICD-10-CM | POA: Diagnosis not present

## 2018-01-24 DIAGNOSIS — C9000 Multiple myeloma not having achieved remission: Secondary | ICD-10-CM | POA: Diagnosis not present

## 2018-01-24 DIAGNOSIS — Z9484 Stem cells transplant status: Secondary | ICD-10-CM | POA: Diagnosis not present

## 2018-01-24 DIAGNOSIS — R530 Neoplastic (malignant) related fatigue: Secondary | ICD-10-CM | POA: Diagnosis not present

## 2018-01-24 MED ORDER — SODIUM CHLORIDE 0.9 % IV SOLN
Freq: Once | INTRAVENOUS | Status: AC
  Administered 2018-01-24: 14:00:00 via INTRAVENOUS
  Filled 2018-01-24: qty 250

## 2018-01-24 MED ORDER — HEPARIN SOD (PORK) LOCK FLUSH 100 UNIT/ML IV SOLN
500.0000 [IU] | Freq: Once | INTRAVENOUS | Status: AC | PRN
  Administered 2018-01-24: 500 [IU]
  Filled 2018-01-24: qty 5

## 2018-01-24 MED ORDER — ONDANSETRON HCL 8 MG PO TABS
8.0000 mg | ORAL_TABLET | Freq: Once | ORAL | Status: AC
  Administered 2018-01-24: 8 mg via ORAL

## 2018-01-24 MED ORDER — ONDANSETRON HCL 8 MG PO TABS
ORAL_TABLET | ORAL | Status: AC
  Filled 2018-01-24: qty 1

## 2018-01-24 MED ORDER — DEXAMETHASONE SODIUM PHOSPHATE 10 MG/ML IJ SOLN
INTRAMUSCULAR | Status: AC
  Filled 2018-01-24: qty 1

## 2018-01-24 MED ORDER — DEXTROSE 5 % IV SOLN
27.0000 mg/m2 | Freq: Once | INTRAVENOUS | Status: AC
  Administered 2018-01-24: 50 mg via INTRAVENOUS
  Filled 2018-01-24: qty 15

## 2018-01-24 MED ORDER — SODIUM CHLORIDE 0.9% FLUSH
10.0000 mL | INTRAVENOUS | Status: DC | PRN
  Administered 2018-01-24: 10 mL
  Filled 2018-01-24: qty 10

## 2018-01-24 MED ORDER — DEXAMETHASONE SODIUM PHOSPHATE 10 MG/ML IJ SOLN
10.0000 mg | Freq: Once | INTRAMUSCULAR | Status: AC
  Administered 2018-01-24: 10 mg via INTRAVENOUS

## 2018-01-24 NOTE — Patient Instructions (Signed)
[facility] Discharge Instructions for Patients Receiving Chemotherapy  Today you received the following chemotherapy agents :  Calfizomib.  To help prevent nausea and vomiting after your treatment, we encourage you to take your nausea medication as prescribed.   If you develop nausea and vomiting that is not controlled by your nausea medication, call the clinic.   BELOW ARE SYMPTOMS THAT SHOULD BE REPORTED IMMEDIATELY:  *FEVER GREATER THAN 100.5 F  *CHILLS WITH OR WITHOUT FEVER  NAUSEA AND VOMITING THAT IS NOT CONTROLLED WITH YOUR NAUSEA MEDICATION  *UNUSUAL SHORTNESS OF BREATH  *UNUSUAL BRUISING OR BLEEDING  TENDERNESS IN MOUTH AND THROAT WITH OR WITHOUT PRESENCE OF ULCERS  *URINARY PROBLEMS  *BOWEL PROBLEMS  UNUSUAL RASH Items with * indicate a potential emergency and should be followed up as soon as possible.  Feel free to call the clinic should you have any questions or concerns. The clinic phone number is [phone].  Please show the [place] at check-in to the Emergency Department and triage nurse.

## 2018-01-30 ENCOUNTER — Other Ambulatory Visit: Payer: Self-pay | Admitting: Medical

## 2018-01-30 ENCOUNTER — Inpatient Hospital Stay: Payer: Medicare Other

## 2018-01-30 ENCOUNTER — Other Ambulatory Visit: Payer: Self-pay

## 2018-01-30 ENCOUNTER — Inpatient Hospital Stay (HOSPITAL_BASED_OUTPATIENT_CLINIC_OR_DEPARTMENT_OTHER): Payer: Medicare Other | Admitting: Medical

## 2018-01-30 VITALS — BP 140/86 | HR 71 | Temp 98.0°F | Resp 16

## 2018-01-30 DIAGNOSIS — N289 Disorder of kidney and ureter, unspecified: Secondary | ICD-10-CM | POA: Diagnosis not present

## 2018-01-30 DIAGNOSIS — C9000 Multiple myeloma not having achieved remission: Secondary | ICD-10-CM

## 2018-01-30 DIAGNOSIS — R530 Neoplastic (malignant) related fatigue: Secondary | ICD-10-CM | POA: Diagnosis not present

## 2018-01-30 DIAGNOSIS — D630 Anemia in neoplastic disease: Secondary | ICD-10-CM | POA: Diagnosis not present

## 2018-01-30 DIAGNOSIS — R531 Weakness: Secondary | ICD-10-CM | POA: Diagnosis not present

## 2018-01-30 DIAGNOSIS — D649 Anemia, unspecified: Secondary | ICD-10-CM

## 2018-01-30 DIAGNOSIS — Z9484 Stem cells transplant status: Secondary | ICD-10-CM | POA: Diagnosis not present

## 2018-01-30 LAB — CBC WITH DIFFERENTIAL (CANCER CENTER ONLY)
Abs Immature Granulocytes: 0.06 10*3/uL (ref 0.00–0.07)
Basophils Absolute: 0 10*3/uL (ref 0.0–0.1)
Basophils Relative: 0 %
Eosinophils Absolute: 0.2 10*3/uL (ref 0.0–0.5)
Eosinophils Relative: 5 %
HCT: 26 % — ABNORMAL LOW (ref 36.0–46.0)
Hemoglobin: 8.3 g/dL — ABNORMAL LOW (ref 12.0–15.0)
Immature Granulocytes: 2 %
Lymphocytes Relative: 32 %
Lymphs Abs: 1.2 10*3/uL (ref 0.7–4.0)
MCH: 34.6 pg — ABNORMAL HIGH (ref 26.0–34.0)
MCHC: 31.9 g/dL (ref 30.0–36.0)
MCV: 108.3 fL — ABNORMAL HIGH (ref 80.0–100.0)
Monocytes Absolute: 0.5 10*3/uL (ref 0.1–1.0)
Monocytes Relative: 13 %
Neutro Abs: 1.8 10*3/uL (ref 1.7–7.7)
Neutrophils Relative %: 48 %
Platelet Count: 52 10*3/uL — ABNORMAL LOW (ref 150–400)
RBC: 2.4 MIL/uL — ABNORMAL LOW (ref 3.87–5.11)
RDW: 15.9 % — AB (ref 11.5–15.5)
WBC Count: 3.7 10*3/uL — ABNORMAL LOW (ref 4.0–10.5)
nRBC: 0 % (ref 0.0–0.2)

## 2018-01-30 LAB — CMP (CANCER CENTER ONLY)
ALK PHOS: 118 U/L (ref 38–126)
ALT: 22 U/L (ref 0–44)
AST: 18 U/L (ref 15–41)
Albumin: 3.1 g/dL — ABNORMAL LOW (ref 3.5–5.0)
Anion gap: 8 (ref 5–15)
BUN: 34 mg/dL — ABNORMAL HIGH (ref 8–23)
CALCIUM: 9.1 mg/dL (ref 8.9–10.3)
CO2: 27 mmol/L (ref 22–32)
Chloride: 107 mmol/L (ref 98–111)
Creatinine: 2.93 mg/dL — ABNORMAL HIGH (ref 0.44–1.00)
GFR, EST AFRICAN AMERICAN: 17 mL/min — AB (ref >60)
GFR, EST NON AFRICAN AMERICAN: 15 mL/min — AB (ref >60)
Glucose, Bld: 111 mg/dL — ABNORMAL HIGH (ref 70–99)
Potassium: 4.4 mmol/L (ref 3.5–5.1)
Sodium: 142 mmol/L (ref 135–145)
Total Bilirubin: 0.8 mg/dL (ref 0.3–1.2)
Total Protein: 5.8 g/dL — ABNORMAL LOW (ref 6.5–8.1)

## 2018-01-30 LAB — PREPARE RBC (CROSSMATCH)

## 2018-01-30 MED ORDER — DEXAMETHASONE SODIUM PHOSPHATE 10 MG/ML IJ SOLN
INTRAMUSCULAR | Status: AC
  Filled 2018-01-30: qty 1

## 2018-01-30 MED ORDER — SODIUM CHLORIDE 0.9 % IV SOLN
225.0000 mg/m2 | Freq: Once | INTRAVENOUS | Status: AC
  Administered 2018-01-30: 420 mg via INTRAVENOUS
  Filled 2018-01-30: qty 21

## 2018-01-30 MED ORDER — DEXTROSE 5 % IV SOLN
27.0000 mg/m2 | Freq: Once | INTRAVENOUS | Status: AC
  Administered 2018-01-30: 50 mg via INTRAVENOUS
  Filled 2018-01-30: qty 10

## 2018-01-30 MED ORDER — HEPARIN SOD (PORK) LOCK FLUSH 100 UNIT/ML IV SOLN
500.0000 [IU] | Freq: Once | INTRAVENOUS | Status: AC | PRN
  Administered 2018-01-30: 500 [IU]
  Filled 2018-01-30: qty 5

## 2018-01-30 MED ORDER — SODIUM CHLORIDE 0.9 % IV SOLN
Freq: Once | INTRAVENOUS | Status: AC
  Administered 2018-01-30: 15:00:00 via INTRAVENOUS
  Filled 2018-01-30: qty 250

## 2018-01-30 MED ORDER — ONDANSETRON HCL 8 MG PO TABS
ORAL_TABLET | ORAL | Status: AC
  Filled 2018-01-30: qty 1

## 2018-01-30 MED ORDER — DEXAMETHASONE SODIUM PHOSPHATE 10 MG/ML IJ SOLN
10.0000 mg | Freq: Once | INTRAMUSCULAR | Status: AC
  Administered 2018-01-30: 10 mg via INTRAVENOUS

## 2018-01-30 MED ORDER — ONDANSETRON HCL 8 MG PO TABS
8.0000 mg | ORAL_TABLET | Freq: Once | ORAL | Status: AC
  Administered 2018-01-30: 8 mg via ORAL

## 2018-01-30 MED ORDER — SODIUM CHLORIDE 0.9 % IJ SOLN
10.0000 mL | INTRAMUSCULAR | Status: DC | PRN
  Administered 2018-01-30: 10 mL
  Filled 2018-01-30: qty 10

## 2018-01-30 NOTE — Patient Instructions (Signed)
[facility] Discharge Instructions for Patients Receiving Chemotherapy  Today you received the following chemotherapy agents:  Cytoxan & Kyprolis  To help prevent nausea and vomiting after your treatment, we encourage you to take your nausea medication as prescribed.   If you develop nausea and vomiting that is not controlled by your nausea medication, call the clinic.   BELOW ARE SYMPTOMS THAT SHOULD BE REPORTED IMMEDIATELY:  *FEVER GREATER THAN 100.5 F  *CHILLS WITH OR WITHOUT FEVER  NAUSEA AND VOMITING THAT IS NOT CONTROLLED WITH YOUR NAUSEA MEDICATION  *UNUSUAL SHORTNESS OF BREATH  *UNUSUAL BRUISING OR BLEEDING  TENDERNESS IN MOUTH AND THROAT WITH OR WITHOUT PRESENCE OF ULCERS  *URINARY PROBLEMS  *BOWEL PROBLEMS  UNUSUAL RASH Items with * indicate a potential emergency and should be followed up as soon as possible.  Feel free to call the clinic should you have any questions or concerns. The clinic phone number is [phone].  Please show the [facility] at check-in to the Emergency Department and triage nurse.  Return tomorrow for 2 units of blood & kyprolis.

## 2018-01-30 NOTE — Progress Notes (Signed)
Pt reports not feeling well & c/o feeling weak.  She also reports that R ear is stopped up.  Message to Dr Julien Nordmann to review labs to see if OK for treatment.  OK to treat per Sandi Mealy PA with low platelets.  Pt will receive blood transfusion tomorrow.  Lucianne Lei verified with Dr Irene Limbo.

## 2018-01-31 ENCOUNTER — Inpatient Hospital Stay: Payer: Medicare Other

## 2018-01-31 ENCOUNTER — Ambulatory Visit: Payer: Medicare Other

## 2018-01-31 ENCOUNTER — Telehealth: Payer: Self-pay | Admitting: Internal Medicine

## 2018-01-31 LAB — ABO/RH: ABO/RH(D): O POS

## 2018-01-31 NOTE — Progress Notes (Signed)
Nancy Norris was seen in the infusion room today.  Her labs were reviewed with Nancy Norris.  A CBC returned with a WBC of 3.7, hemoglobin of 8.3, hematocrit of 26, and platelet count of 52.  Her chemistry panel returned with a BUN of 34 and a creatinine of 2.93 which is relatively stable.  She was to receive cycle 19, day 8 of therapy today.  Nancy Norris approved for her to be treated today.  She has been increasingly fatigued and has been having dyspnea on exertion.  Plans are to transfuse her with 2 units of packed red blood cells tomorrow.  Nancy Norris, MHS, PA-C Physician Assistant

## 2018-01-31 NOTE — Progress Notes (Signed)
These preliminary result these preliminary results were noted.  Awaiting final report.

## 2018-01-31 NOTE — Telephone Encounter (Signed)
Just spoke with patient on the phone and she told me that at 2am last night she fell while trying to go to the bathroom.   At 10:15am she stated that she had just been able to get to the phone and I told her to call 911. Patient told me that she was going to call 911 and seek medical attention. I told her that I would get her appointments cancelled and to not worry about anything.   Let symptom management know and sent a scheduling message.

## 2018-02-03 LAB — TYPE AND SCREEN
ABO/RH(D): O POS
ANTIBODY SCREEN: NEGATIVE
Unit division: 0
Unit division: 0

## 2018-02-03 LAB — BPAM RBC
Blood Product Expiration Date: 202002132359
Blood Product Expiration Date: 202002132359
Unit Type and Rh: 5100
Unit Type and Rh: 5100

## 2018-02-06 ENCOUNTER — Inpatient Hospital Stay (HOSPITAL_BASED_OUTPATIENT_CLINIC_OR_DEPARTMENT_OTHER): Payer: Medicare Other | Admitting: Medical

## 2018-02-06 ENCOUNTER — Inpatient Hospital Stay: Payer: Medicare Other

## 2018-02-06 ENCOUNTER — Ambulatory Visit: Payer: Medicare Other | Admitting: Medical

## 2018-02-06 ENCOUNTER — Other Ambulatory Visit: Payer: Self-pay | Admitting: Emergency Medicine

## 2018-02-06 VITALS — BP 114/69 | HR 68 | Temp 98.4°F | Resp 16 | Ht 65.0 in | Wt 177.6 lb

## 2018-02-06 DIAGNOSIS — Z86000 Personal history of in-situ neoplasm of breast: Secondary | ICD-10-CM | POA: Diagnosis not present

## 2018-02-06 DIAGNOSIS — D630 Anemia in neoplastic disease: Secondary | ICD-10-CM | POA: Diagnosis not present

## 2018-02-06 DIAGNOSIS — R530 Neoplastic (malignant) related fatigue: Secondary | ICD-10-CM

## 2018-02-06 DIAGNOSIS — Z9484 Stem cells transplant status: Secondary | ICD-10-CM | POA: Diagnosis not present

## 2018-02-06 DIAGNOSIS — R531 Weakness: Secondary | ICD-10-CM

## 2018-02-06 DIAGNOSIS — N289 Disorder of kidney and ureter, unspecified: Secondary | ICD-10-CM | POA: Diagnosis not present

## 2018-02-06 DIAGNOSIS — C9000 Multiple myeloma not having achieved remission: Secondary | ICD-10-CM

## 2018-02-06 DIAGNOSIS — Z87891 Personal history of nicotine dependence: Secondary | ICD-10-CM

## 2018-02-06 DIAGNOSIS — Z9221 Personal history of antineoplastic chemotherapy: Secondary | ICD-10-CM

## 2018-02-06 LAB — CMP (CANCER CENTER ONLY)
ALK PHOS: 93 U/L (ref 38–126)
ALT: 14 U/L (ref 0–44)
AST: 10 U/L — ABNORMAL LOW (ref 15–41)
Albumin: 3.1 g/dL — ABNORMAL LOW (ref 3.5–5.0)
Anion gap: 9 (ref 5–15)
BUN: 23 mg/dL (ref 8–23)
CHLORIDE: 108 mmol/L (ref 98–111)
CO2: 24 mmol/L (ref 22–32)
Calcium: 8.6 mg/dL — ABNORMAL LOW (ref 8.9–10.3)
Creatinine: 2.53 mg/dL — ABNORMAL HIGH (ref 0.44–1.00)
GFR, Est AFR Am: 21 mL/min — ABNORMAL LOW (ref >60)
GFR, Estimated: 18 mL/min — ABNORMAL LOW (ref >60)
Glucose, Bld: 126 mg/dL — ABNORMAL HIGH (ref 70–99)
Potassium: 4.6 mmol/L (ref 3.5–5.1)
Sodium: 141 mmol/L (ref 135–145)
Total Bilirubin: 0.9 mg/dL (ref 0.3–1.2)
Total Protein: 5.5 g/dL — ABNORMAL LOW (ref 6.5–8.1)

## 2018-02-06 LAB — CBC WITH DIFFERENTIAL (CANCER CENTER ONLY)
Abs Immature Granulocytes: 0.03 10*3/uL (ref 0.00–0.07)
Basophils Absolute: 0 10*3/uL (ref 0.0–0.1)
Basophils Relative: 0 %
Eosinophils Absolute: 0.1 10*3/uL (ref 0.0–0.5)
Eosinophils Relative: 6 %
HCT: 21.7 % — ABNORMAL LOW (ref 36.0–46.0)
Hemoglobin: 6.9 g/dL — CL (ref 12.0–15.0)
Immature Granulocytes: 1 %
LYMPHS PCT: 44 %
Lymphs Abs: 1 10*3/uL (ref 0.7–4.0)
MCH: 34.7 pg — ABNORMAL HIGH (ref 26.0–34.0)
MCHC: 31.8 g/dL (ref 30.0–36.0)
MCV: 109 fL — ABNORMAL HIGH (ref 80.0–100.0)
Monocytes Absolute: 0.3 10*3/uL (ref 0.1–1.0)
Monocytes Relative: 11 %
Neutro Abs: 0.9 10*3/uL — ABNORMAL LOW (ref 1.7–7.7)
Neutrophils Relative %: 38 %
Platelet Count: 58 10*3/uL — ABNORMAL LOW (ref 150–400)
RBC: 1.99 MIL/uL — ABNORMAL LOW (ref 3.87–5.11)
RDW: 15.9 % — ABNORMAL HIGH (ref 11.5–15.5)
WBC Count: 2.4 10*3/uL — ABNORMAL LOW (ref 4.0–10.5)
nRBC: 0 % (ref 0.0–0.2)

## 2018-02-06 LAB — SAMPLE TO BLOOD BANK

## 2018-02-06 LAB — PREPARE RBC (CROSSMATCH)

## 2018-02-06 MED ORDER — ACETAMINOPHEN 325 MG PO TABS
650.0000 mg | ORAL_TABLET | Freq: Once | ORAL | Status: AC
  Administered 2018-02-06: 650 mg via ORAL

## 2018-02-06 MED ORDER — SODIUM CHLORIDE 0.9% IV SOLUTION
250.0000 mL | Freq: Once | INTRAVENOUS | Status: AC
  Administered 2018-02-06: 250 mL via INTRAVENOUS
  Filled 2018-02-06: qty 250

## 2018-02-06 MED ORDER — ACETAMINOPHEN 325 MG PO TABS
ORAL_TABLET | ORAL | Status: AC
  Filled 2018-02-06: qty 2

## 2018-02-06 MED ORDER — SODIUM CHLORIDE 0.9% FLUSH
10.0000 mL | Freq: Once | INTRAVENOUS | Status: AC
  Administered 2018-02-06: 10 mL via INTRAVENOUS
  Filled 2018-02-06: qty 10

## 2018-02-06 MED ORDER — HEPARIN SOD (PORK) LOCK FLUSH 100 UNIT/ML IV SOLN
500.0000 [IU] | Freq: Once | INTRAVENOUS | Status: AC
  Administered 2018-02-06: 500 [IU] via INTRAVENOUS
  Filled 2018-02-06: qty 5

## 2018-02-06 NOTE — Patient Instructions (Signed)
Blood Transfusion, Adult A blood transfusion is a procedure in which you are given blood through an IV tube. You may need this procedure because of:  Illness.  Surgery.  Injury. The blood may come from someone else (a donor). You may also be able to donate blood for yourself (autologous blood donation). The blood given in a transfusion is made up of different types of cells. You may get:  Red blood cells. These carry oxygen to the cells in the body.  White blood cells. These help you fight infections.  Platelets. These help your blood to clot.  Plasma. This is the liquid part of your blood. It helps with fluid imbalances. If you have a clotting disorder, you may also get other types of blood products. What happens before the procedure?  You will have a blood test to find out your blood type. The test also finds out what type of blood your body will accept and matches it to the donor type.  If you are going to have a planned surgery, you may be able to donate your own blood. This may be done in case you need a transfusion.  If you have had an allergic reaction to a transfusion in the past, you may be given medicine to help prevent a reaction. This medicine may be given to you by mouth or through an IV.  You will have your temperature, blood pressure, and pulse checked.  Follow instructions from your doctor about what you cannot eat or drink.  Ask your doctor about: ? Changing or stopping your regular medicines. This is important if you take diabetes medicines or blood thinners. ? Taking medicines such as aspirin and ibuprofen. These medicines can thin your blood. Do not take these medicines before your procedure if your doctor tells you not to. What happens during the procedure?  An IV tube will be put into one of your veins.  The bag of donated blood will be attached to your IV tube. Then, the blood will enter through your vein.  Your temperature, blood pressure, and pulse will  be checked regularly during the procedure. This is done to find early signs of a transfusion reaction.  If you have any signs or symptoms of a reaction, your transfusion will be stopped. You may also be given medicine.  When the transfusion is done, your IV tube will be taken out.  Pressure may be applied to the IV site for a few minutes.  A bandage (dressing) will be put on the IV site. The procedure may vary among doctors and hospitals. What happens after the procedure?  Your temperature, blood pressure, heart rate, breathing rate, and blood oxygen level will be checked often.  Your blood may be tested to see how you are responding to the transfusion.  You may be warmed with fluids or blankets. This is done to keep the temperature of your body normal. Summary  A blood transfusion is a procedure in which you are given blood through an IV tube.  The blood may come from someone else (a donor). You may also be able to donate blood for yourself.  If you have had an allergic reaction to a transfusion in the past, you may be given medicine to help prevent a reaction. This medicine may be given to you by mouth or through an IV tube.  Your temperature, blood pressure, heart rate, breathing rate, and blood oxygen level will be checked often.  Your blood may be tested to see  how you are responding to the transfusion. This information is not intended to replace advice given to you by your health care provider. Make sure you discuss any questions you have with your health care provider. Document Released: 03/30/2008 Document Revised: 08/26/2015 Document Reviewed: 08/26/2015 Elsevier Interactive Patient Education  [employer].

## 2018-02-06 NOTE — Progress Notes (Signed)
Pt presents today with intermittent syncope reported at home.  Pt reports losing consciousness and falling several times in the last week, unsure of any head injury or how long she was out each time.  Reports intermittent "squeezing headache" around her temples, fatigue, increased SOB/DOE, and weakness in legs bilat.  Pt did not show up for last scheduled blood transfusion appt b/c she fell the night before and called EMS.  However she would not accept a ride to the hospital "because I felt better, I always feel better right after I come back".  No unilat weakness, slurred speech, or facial droop noted.  A&Ox4  Pt ambulated with cane and belongings to infusion area where she will receive 1 unit RBCs today (and follow up with 1 unit tomorrow).  Report given to RN Delle Reining and RN Tim.  BBB in place.

## 2018-02-07 ENCOUNTER — Inpatient Hospital Stay: Payer: Medicare Other

## 2018-02-07 ENCOUNTER — Other Ambulatory Visit: Payer: Self-pay | Admitting: Emergency Medicine

## 2018-02-07 DIAGNOSIS — R1310 Dysphagia, unspecified: Secondary | ICD-10-CM | POA: Diagnosis not present

## 2018-02-07 DIAGNOSIS — R530 Neoplastic (malignant) related fatigue: Secondary | ICD-10-CM | POA: Diagnosis not present

## 2018-02-07 DIAGNOSIS — Z9484 Stem cells transplant status: Secondary | ICD-10-CM | POA: Diagnosis not present

## 2018-02-07 DIAGNOSIS — C9000 Multiple myeloma not having achieved remission: Secondary | ICD-10-CM | POA: Diagnosis not present

## 2018-02-07 DIAGNOSIS — D649 Anemia, unspecified: Secondary | ICD-10-CM

## 2018-02-07 DIAGNOSIS — D630 Anemia in neoplastic disease: Secondary | ICD-10-CM

## 2018-02-07 DIAGNOSIS — N289 Disorder of kidney and ureter, unspecified: Secondary | ICD-10-CM | POA: Diagnosis not present

## 2018-02-07 DIAGNOSIS — R531 Weakness: Secondary | ICD-10-CM | POA: Diagnosis not present

## 2018-02-07 MED ORDER — SODIUM CHLORIDE 0.9% FLUSH
10.0000 mL | INTRAVENOUS | Status: AC | PRN
  Administered 2018-02-07: 10 mL
  Filled 2018-02-07: qty 10

## 2018-02-07 MED ORDER — SODIUM CHLORIDE 0.9% IV SOLUTION
250.0000 mL | Freq: Once | INTRAVENOUS | Status: AC
  Administered 2018-02-07: 250 mL via INTRAVENOUS
  Filled 2018-02-07: qty 250

## 2018-02-07 MED ORDER — ACETAMINOPHEN 325 MG PO TABS
ORAL_TABLET | ORAL | Status: AC
  Filled 2018-02-07: qty 2

## 2018-02-07 MED ORDER — ACETAMINOPHEN 325 MG PO TABS
650.0000 mg | ORAL_TABLET | Freq: Once | ORAL | Status: AC
  Administered 2018-02-07: 650 mg via ORAL

## 2018-02-07 MED ORDER — HEPARIN SOD (PORK) LOCK FLUSH 100 UNIT/ML IV SOLN
500.0000 [IU] | INTRAVENOUS | Status: AC | PRN
  Administered 2018-02-07: 500 [IU]
  Filled 2018-02-07: qty 5

## 2018-02-07 NOTE — Patient Instructions (Signed)
Blood Transfusion, Adult A blood transfusion is a procedure in which you are given blood through an IV tube. You may need this procedure because of:  Illness.  Surgery.  Injury. The blood may come from someone else (a donor). You may also be able to donate blood for yourself (autologous blood donation). The blood given in a transfusion is made up of different types of cells. You may get:  Red blood cells. These carry oxygen to the cells in the body.  White blood cells. These help you fight infections.  Platelets. These help your blood to clot.  Plasma. This is the liquid part of your blood. It helps with fluid imbalances. If you have a clotting disorder, you may also get other types of blood products. What happens before the procedure?  You will have a blood test to find out your blood type. The test also finds out what type of blood your body will accept and matches it to the donor type.  If you are going to have a planned surgery, you may be able to donate your own blood. This may be done in case you need a transfusion.  If you have had an allergic reaction to a transfusion in the past, you may be given medicine to help prevent a reaction. This medicine may be given to you by mouth or through an IV.  You will have your temperature, blood pressure, and pulse checked.  Follow instructions from your doctor about what you cannot eat or drink.  Ask your doctor about: ? Changing or stopping your regular medicines. This is important if you take diabetes medicines or blood thinners. ? Taking medicines such as aspirin and ibuprofen. These medicines can thin your blood. Do not take these medicines before your procedure if your doctor tells you not to. What happens during the procedure?  An IV tube will be put into one of your veins.  The bag of donated blood will be attached to your IV tube. Then, the blood will enter through your vein.  Your temperature, blood pressure, and pulse will  be checked regularly during the procedure. This is done to find early signs of a transfusion reaction.  If you have any signs or symptoms of a reaction, your transfusion will be stopped. You may also be given medicine.  When the transfusion is done, your IV tube will be taken out.  Pressure may be applied to the IV site for a few minutes.  A bandage (dressing) will be put on the IV site. The procedure may vary among doctors and hospitals. What happens after the procedure?  Your temperature, blood pressure, heart rate, breathing rate, and blood oxygen level will be checked often.  Your blood may be tested to see how you are responding to the transfusion.  You may be warmed with fluids or blankets. This is done to keep the temperature of your body normal. Summary  A blood transfusion is a procedure in which you are given blood through an IV tube.  The blood may come from someone else (a donor). You may also be able to donate blood for yourself.  If you have had an allergic reaction to a transfusion in the past, you may be given medicine to help prevent a reaction. This medicine may be given to you by mouth or through an IV tube.  Your temperature, blood pressure, heart rate, breathing rate, and blood oxygen level will be checked often.  Your blood may be tested to see  how you are responding to the transfusion. This information is not intended to replace advice given to you by your health care provider. Make sure you discuss any questions you have with your health care provider. Document Released: 03/30/2008 Document Revised: 08/26/2015 Document Reviewed: 08/26/2015 Elsevier Interactive Patient Education  [employer].

## 2018-02-07 NOTE — Progress Notes (Signed)
These preliminary result these preliminary results were noted.  Awaiting final report.

## 2018-02-08 LAB — TYPE AND SCREEN
ABO/RH(D): O POS
Antibody Screen: NEGATIVE
Unit division: 0
Unit division: 0

## 2018-02-08 LAB — BPAM RBC
Blood Product Expiration Date: 202002202359
Blood Product Expiration Date: 202002202359
ISSUE DATE / TIME: 202001241506
ISSUE DATE / TIME: 202001231503
Unit Type and Rh: 5100
Unit Type and Rh: 5100

## 2018-02-11 MED ORDER — LORAZEPAM 2 MG/ML IJ SOLN
INTRAMUSCULAR | Status: AC
  Filled 2018-02-11: qty 1

## 2018-02-11 NOTE — Progress Notes (Signed)
[facility] Progress Note   Nancy Norris [id number] Dec 24, 1940 78 y.o.  Nancy Norris is managed by Dr. Fanny Norris. Nancy Norris  Actively treated with chemotherapy/immunotherapy/hormonal therapy: yes  Current Therapy: Kyprolis and Cytoxan  Last Treated: 01/30/2018 (cycle 19, day 8)  Assessment: Plan:    Anemia in neoplastic disease - Plan: Type and screen, 0.9 %  sodium chloride infusion (Manually program via Guardrails IV Fluids), acetaminophen (TYLENOL) tablet 650 mg, Prepare RBC, Transfuse RBC, Prepare RBC, Type and screen, Transfuse RBC, sodium chloride flush (NS) 0.9 % injection 10 mL, heparin lock flush 100 unit/mL, Ambulatory referral to Gastroenterology, CANCELED: Practitioner attestation of consent, CANCELED: Complete patient signature process for consent form, CANCELED: Care order/instruction  Multiple myeloma not having achieved remission ([place])   Anemia with melena: A CBC returned today with a hemoglobin of 6.9 and a hematocrit of 21.7.  The patient was transfused with 1 unit of packed red blood cells today and will return tomorrow for a second unit.  She has been referred to Dr. Anson Norris for consideration of a colonoscopy.  Multiple myeloma without remission: The patient is status post cycle 19, day 8 of Kyprolis and Cytoxan which was dosed on 01/30/2018.  She will be seen in follow-up by Dr. Earlie Norris on 02/19/2018.  Please see After Visit Summary for patient specific instructions.  Future Appointments  Date Time Provider [place]  02/12/2018  2:00 PM [facility] 1 [facility] None  02/19/2018 11:30 AM [facility] 1 [facility] None  02/19/2018 12:00 PM Nancy Bears, MD [facility] None  02/19/2018  1:00 PM [facility] INFUSION [facility] None  02/20/2018  2:00 PM [facility] INFUSION [facility] None  02/26/2018 12:00 PM [facility] 1 [facility] None  02/26/2018  1:00 PM [facility] INFUSION [facility] None  02/27/2018  1:30 PM  [facility] INFUSION [facility] None  03/05/2018 12:00 PM [facility] 1 [facility] None  03/05/2018  1:00 PM [facility] INFUSION [facility] None  03/06/2018  1:00 PM [facility] INFUSION [facility] None  03/19/2018  9:00 AM Nancy Norris, Nancy Pina, NP [facility] None  03/19/2018 10:00 AM [facility] 4 [facility] None  03/19/2018 10:15 AM Nancy Bears, MD [facility] None  03/19/2018 11:15 AM [facility] INFUSION [facility] None  03/20/2018  2:00 PM [facility] INFUSION [facility] None  03/26/2018 12:00 PM [facility] 4 [facility] None  03/26/2018  1:00 PM [facility] INFUSION [facility] None  03/27/2018  2:30 PM [facility] INFUSION [facility] None  03/21/2018 12:00 PM [facility] 4 [facility] None  03/31/2018  1:00 PM [facility] INFUSION [facility] None  04/03/2018  2:30 PM [facility] INFUSION [facility] None    Orders Placed This Encounter  Procedures  . Ambulatory referral to Gastroenterology  . Type and screen  . Prepare RBC       Subjective:   Patient ID:  Nancy Norris is a 78 y.o. (DOB [birth year]) female.  Chief Complaint:  Chief Complaint  Patient presents with  . Fatigue    HPI [name] is a 78 year old female with a history of multiple myeloma who is managed by Dr. Fanny Norris. Nancy Norris and is status post cycle 19, day 8 of Kyprolis and Cytoxan which was dosed on 01/30/2018.  She presents to the office today with a report of progressive fatigue.  She has had melena.  A CBC returned today with a hemoglobin of 6.9 and a hematocrit of 21.7.  She is having mild shortness of breath.  She denies fevers, chills, or sweats.  She reports that she is not due for a  colonoscopy for another 1 to 2 years.  A referral will be made to her gastroenterologist.  Medications: I have reviewed the patient's current medications.  Allergies:  Allergies  Allergen Reactions  . Codeine Anaphylaxis    anaphylasix tolarates oxycodone per care cast  . Latex Shortness Of Breath    . Chocolate     migraines  . Onion Other (See Comments)    Causes migraine headaches  . Oxycodone   . Zyprexa [Olanzapine] Other (See Comments)    Confusion , dizzy,unsteady  . Adhesive [Tape] Other (See Comments)    blisters  . Heparin Rash    Can flush port with heparin  . Hydrocodone Rash    With extreme itching  . Iodinated Diagnostic Agents Hives, Itching, Rash and Other (See Comments)    Happened 60 years ago Stage IV kidney function  . Promethazine-Dm Hives  . Sulfa Antibiotics Itching    Past Medical History:  Diagnosis Date  . Anemia   . Anginal pain ([place])    used NTG x 2 May 31 and 06/15/13   . Anxiety   . Arthritis   . B12 deficiency 12/04/2014  . Breast cancer ([place])   . Complication of anesthesia   . COPD (chronic obstructive pulmonary disease) ([place])   . Cryptococcal pneumonitis ([place]) 11/22/2015  . Depression   . Dizziness   . Dyspnea   . Fibromyalgia   . Fibromyalgia   . GERD (gastroesophageal reflux disease)   . Headache(784.0)   . Heart murmur   . Hemoptysis 10/21/2015  . History of blood transfusion    last one May 12   . Hx of cardiovascular stress test    LexiScan with low level exercise Myoview (02/2013): No ischemia, EF 72%; normal study  . Hx of echocardiogram    a.  Echocardiogram (12/26/2012): EF 62-83%, grade 1 diastolic dysfunction;   b.  Echocardiogram (02/2013): EF 55-60%, no WMA, trivial effusion  . Hyperkalemia   . Hyponatremia   . Hypotension   . Hypothyroidism   . Mucositis   . Multiple myeloma   . Myocardial infarction [facility])    in past, patient was unaware.   . Neuropathy   . Nodule of left lung 09/13/2015  . Pain in joint, pelvic region and thigh 07/07/2015  . Pneumonia    several  . PONV (postoperative nausea and vomiting) 2008   after mastestomy    Past Surgical History:  Procedure Laterality Date  . ABDOMINAL HYSTERECTOMY  1981  . AV FISTULA PLACEMENT Left 06/19/2013   Procedure: CREATION OF LEFT ARM ARTERIOVENOUS (AV)  FISTULA ;  Surgeon: Angelia Mould, MD;  Location: [place];  Service: Vascular;  Laterality: Left;  . BREAST RECONSTRUCTION    . BREAST SURGERY Right    reduction  . CATARACT EXTRACTION, BILATERAL    . CHOLECYSTECTOMY  1971  . COLONOSCOPY    . EYE SURGERY Bilateral    lens implant  . history of Port removal    . LUNG BIOPSY  10/21/2015  . MASTECTOMY Left 2008  . PORTACATH PLACEMENT  12/2012   has had 2  . RESECTION OF ARTERIOVENOUS FISTULA ANEURYSM Left 06/04/2017   Procedure: LIGATION ANEURYSM OF LEFT ARTERIOVENOUS FISTULA;  Surgeon: Angelia Mould, MD;  Location: [place];  Service: Vascular;  Laterality: Left;  . Status post stem cell transplant on September 28, 2008.      Family History  Problem Relation Age of Onset  . Arthritis Mother   . Asthma  Mother   . Cancer Sister   . Hyperlipidemia Brother     Social History   Socioeconomic History  . Marital status: Single    Spouse name: Not on file  . Number of children: 1  . Years of education: Not on file  . Highest education level: Not on file  Occupational History  . Occupation: retired-sect.Licensed conveyancer)  Social Needs  . Financial resource strain: Not on file  . Food insecurity:    Worry: Not on file    Inability: Not on file  . Transportation needs:    Medical: Yes    Non-medical: No  Tobacco Use  . Smoking status: Former Smoker    Packs/day: 1.00    Years: 30.00    Pack years: 30.00    Types: Cigarettes    Last attempt to quit: 02/15/2005    Years since quitting: 12.9  . Smokeless tobacco: Never Used  Substance and Sexual Activity  . Alcohol use: No  . Drug use: No  . Sexual activity: Never  Lifestyle  . Physical activity:    Days per week: Not on file    Minutes per session: Not on file  . Stress: Not on file  Relationships  . Social connections:    Talks on phone: Not on file    Gets together: Not on file    Attends religious service: Not on file    Active member of club or  organization: Not on file    Attends meetings of clubs or organizations: Not on file    Relationship status: Not on file  . Intimate partner violence:    Fear of current or ex partner: Not on file    Emotionally abused: Not on file    Physically abused: Not on file    Forced sexual activity: Not on file  Other Topics Concern  . Not on file  Social History Narrative  . Not on file    Past Medical History, Surgical history, Social history, and Family history were reviewed and updated as appropriate.   Please see review of systems for further details on the patient's review from today.   Review of Systems:  Review of Systems  Constitutional: Positive for fatigue. Negative for chills, diaphoresis and fever.  HENT: Negative for trouble swallowing.   Respiratory: Positive for shortness of breath. Negative for cough, choking, chest tightness, wheezing and stridor.   Cardiovascular: Negative for chest pain and palpitations.  Gastrointestinal: Negative for constipation, diarrhea, nausea and vomiting.       Melena  Neurological: Negative for dizziness, syncope and light-headedness.    Objective:   Physical Exam:  BP 114/69   Pulse 68   Temp 98.4 F (36.9 C) (Oral)   Resp 16   Ht '5\' 5"'$  (1.651 m)   Wt 177 lb 9 oz (80.5 kg)   SpO2 100%   BMI 29.55 kg/m  ECOG: 1  Physical Exam Constitutional:      General: She is not in acute distress.    Appearance: She is not diaphoretic.  HENT:     Head: Normocephalic and atraumatic.     Mouth/Throat:     Mouth: Mucous membranes are moist.     Pharynx: Oropharynx is clear.  Cardiovascular:     Rate and Rhythm: Normal rate and regular rhythm.     Heart sounds: Normal heart sounds. No murmur. No friction rub. No gallop.   Pulmonary:     Effort: Pulmonary effort is normal. No respiratory distress.  Breath sounds: Normal breath sounds. No stridor. No wheezing or rales.  Abdominal:     General: Bowel sounds are normal. There is no  distension.     Tenderness: There is no abdominal tenderness. There is no guarding.  Skin:    General: Skin is warm and dry.     Coloration: Skin is not pale.     Findings: No erythema.  Neurological:     Mental Status: She is alert.     Gait: Gait normal.  Psychiatric:        Behavior: Behavior normal.        Thought Content: Thought content normal.        Judgment: Judgment normal.     Lab Review:     Component Value Date/Time   NA 141 02/06/2018 1239   NA 144 01/17/2017 1146   K 4.6 02/06/2018 1239   K 3.7 01/17/2017 1146   CL 108 02/06/2018 1239   CL 105 03/19/2012 0811   CO2 24 02/06/2018 1239   CO2 25 01/17/2017 1146   GLUCOSE 126 (H) 02/06/2018 1239   GLUCOSE 103 01/17/2017 1146   GLUCOSE 94 03/19/2012 0811   BUN 23 02/06/2018 1239   BUN 35.0 (H) 01/17/2017 1146   CREATININE 2.53 (H) 02/06/2018 1239   CREATININE 3.16 (H) 12/18/2017 1137   CREATININE 2.8 (H) 01/17/2017 1146   CALCIUM 8.6 (L) 02/06/2018 1239   CALCIUM 9.1 01/17/2017 1146   PROT 5.5 (L) 02/06/2018 1239   PROT 6.4 01/17/2017 1146   ALBUMIN 3.1 (L) 02/06/2018 1239   ALBUMIN 3.9 01/17/2017 1146   AST 10 (L) 02/06/2018 1239   AST 20 01/17/2017 1146   ALT 14 02/06/2018 1239   ALT 22 01/17/2017 1146   ALKPHOS 93 02/06/2018 1239   ALKPHOS 110 01/17/2017 1146   BILITOT 0.9 02/06/2018 1239   BILITOT 0.73 01/17/2017 1146   GFRNONAA 18 (L) 02/06/2018 1239   GFRNONAA 14 (L) 12/18/2017 1137   GFRAA 21 (L) 02/06/2018 1239   GFRAA 16 (L) 12/18/2017 1137       Component Value Date/Time   WBC 2.4 (L) 02/06/2018 1239   WBC 3.1 (L) 01/20/2018 1949   RBC 1.99 (L) 02/06/2018 1239   HGB 6.9 (LL) 02/06/2018 1239   HGB 11.2 (L) 01/17/2017 1146   HCT 21.7 (L) 02/06/2018 1239   HCT NOLAV 03/18/2017 1645   HCT 33.9 (L) 01/17/2017 1146   PLT 58 (L) 02/06/2018 1239   PLT 117 (L) 01/17/2017 1146   MCV 109.0 (H) 02/06/2018 1239   MCV 111.0 (H) 01/17/2017 1146   MCH 34.7 (H) 02/06/2018 1239   MCHC 31.8  02/06/2018 1239   RDW 15.9 (H) 02/06/2018 1239   RDW 14.5 01/17/2017 1146   LYMPHSABS 1.0 02/06/2018 1239   LYMPHSABS 1.0 01/17/2017 1146   MONOABS 0.3 02/06/2018 1239   MONOABS 0.6 01/17/2017 1146   EOSABS 0.1 02/06/2018 1239   EOSABS 0.1 01/17/2017 1146   BASOSABS 0.0 02/06/2018 1239   BASOSABS 0.1 01/17/2017 1146   -------------------------------  Imaging from last 24 hours (if applicable):  Radiology interpretation: Dg Chest 2 View  Result Date: 01/20/2018 CLINICAL DATA:  Shortness of breath for 1 week. Active treatment for multiple myeloma. EXAM: CHEST - 2 VIEW COMPARISON:  CT 03/17/2017 FINDINGS: Tip of the right chest port tip the atrial caval junction. Chronic eventration of right hemidiaphragm. Linear scarring in the left mid lung. Small nodular opacities in the right upper lung zone, with nodules seen is region  on prior CT. Unchanged heart size and mediastinal contours with aortic atherosclerosis. No acute airspace disease, pleural effusion, or pneumothorax. No acute osseous abnormalities. IMPRESSION: 1. No acute findings. 2. Small nodular opacities in the right upper lung zone, corresponding to nodules seen on March 2019 CT. Electronically Signed   By: Keith Rake M.D.   On: 01/20/2018 20:30   Ct Chest Wo Contrast  Result Date: 01/20/2018 CLINICAL DATA:  Dyspnea, diarrhea and weakness times 10 days. EXAM: CT CHEST WITHOUT CONTRAST TECHNIQUE: Multidetector CT imaging of the chest was performed following the standard protocol without IV contrast. COMPARISON:  03/17/2017 chest CT and 01/10/2016 chest CT FINDINGS: Cardiovascular: Top normal heart size with trace anterior pericardial effusion. Moderate three-vessel coronary arteriosclerosis is redemonstrated. Dilatation of the main pulmonary artery to 3.1 cm compatible with chronic mild pulmonary hypertension noted. Nonaneurysmal atherosclerotic aorta calcifications noted at the arch and descending portion. Mediastinum/Nodes: No  lymphadenopathy of the included base of neck, axilla, mediastinum nor hila. Hilar adenopathy is limited due to lack IV contrast. No thyromegaly or mass. Patent trachea and mainstem bronchi. The CT appearance of the esophagus is unremarkable. Lungs/Pleura: Lingular bandlike scarring is redemonstrated. Right upper lobe, somewhat circumferential opacity with central lucency is noted, series 5/41 though on coronal reformats this may be due to imaging of linear scarring and/or atelectasis in the axial plane simulating a lesion, series 6/62 of the coronal images and series 5/40 the axial images. Findings are new since prior and may represent postinfectious or inflammatory change. Attention follow-up is recommended to exclude other etiologies including neoplasm. Tiny subpleural nodule in the right middle lobe is stable likely postinfectious or inflammatory measuring 3 mm. Additional subpleural right lower lobe lateral basal segment nodule measuring approximately 2 mm, series 05/18. Subsegmental atelectasis is seen at each base. No effusion or pneumothorax. Upper Abdomen: Bilateral renal cortical scarring. Stable hypodense left adrenal nodule measuring 18 mm compatible a benign adenoma. Right adrenal gland is normal. No space-occupying mass of the included liver. Mild pancreatic fatty infiltration. Normal size included spleen. Musculoskeletal: No aggressive osseous lesions. No acute fracture. IMPRESSION: 1. Moderate three-vessel coronary arteriosclerosis. 2. Dilatation of the main pulmonary artery to 3.1 cm compatible with chronic pulmonary hypertension. 3. Right upper lobe circumferential opacity with central lucency may represent postinfectious or inflammatory change. Attention to this area on follow-up is recommended and to exclude neoplasm. On coronal reformats this appears more linear less masslike and is more likely related to postinfectious or inflammatory change. 4. Chronic lingular scarring and/or atelectasis. 5.  Stable few mm right middle and lower lobe nodular densities. 6. Stable left adrenal adenoma measuring 18 mm. 7. Bilateral renal cortical scarring. Aortic Atherosclerosis (ICD10-I70.0). Electronically Signed   By: Ashley Royalty M.D.   On: 01/20/2018 22:29        This case was discussed with Dr. Julien Nordmann. He expressed agreement with my management of this patient.

## 2018-02-12 ENCOUNTER — Other Ambulatory Visit: Payer: Medicare Other

## 2018-02-12 NOTE — Progress Notes (Signed)
Erroneous encounter

## 2018-02-17 ENCOUNTER — Other Ambulatory Visit: Payer: Self-pay | Admitting: Internal Medicine

## 2018-02-19 ENCOUNTER — Encounter: Payer: Self-pay | Admitting: Internal Medicine

## 2018-02-19 ENCOUNTER — Telehealth: Payer: Self-pay | Admitting: Internal Medicine

## 2018-02-19 ENCOUNTER — Inpatient Hospital Stay: Payer: Medicare Other | Attending: Internal Medicine

## 2018-02-19 ENCOUNTER — Inpatient Hospital Stay: Payer: Medicare Other

## 2018-02-19 ENCOUNTER — Inpatient Hospital Stay (HOSPITAL_BASED_OUTPATIENT_CLINIC_OR_DEPARTMENT_OTHER): Payer: Medicare Other | Admitting: Internal Medicine

## 2018-02-19 VITALS — BP 127/79 | HR 81 | Temp 98.8°F | Resp 20 | Ht 65.0 in | Wt 177.6 lb

## 2018-02-19 DIAGNOSIS — Z5111 Encounter for antineoplastic chemotherapy: Secondary | ICD-10-CM | POA: Insufficient documentation

## 2018-02-19 DIAGNOSIS — Z452 Encounter for adjustment and management of vascular access device: Secondary | ICD-10-CM | POA: Diagnosis not present

## 2018-02-19 DIAGNOSIS — C9000 Multiple myeloma not having achieved remission: Secondary | ICD-10-CM

## 2018-02-19 DIAGNOSIS — D696 Thrombocytopenia, unspecified: Secondary | ICD-10-CM

## 2018-02-19 DIAGNOSIS — R5383 Other fatigue: Secondary | ICD-10-CM | POA: Diagnosis not present

## 2018-02-19 DIAGNOSIS — Z9484 Stem cells transplant status: Secondary | ICD-10-CM | POA: Insufficient documentation

## 2018-02-19 DIAGNOSIS — C9002 Multiple myeloma in relapse: Secondary | ICD-10-CM

## 2018-02-19 DIAGNOSIS — Z5112 Encounter for antineoplastic immunotherapy: Secondary | ICD-10-CM | POA: Insufficient documentation

## 2018-02-19 DIAGNOSIS — I252 Old myocardial infarction: Secondary | ICD-10-CM | POA: Diagnosis not present

## 2018-02-19 LAB — CBC WITH DIFFERENTIAL (CANCER CENTER ONLY)
Abs Immature Granulocytes: 0.03 10*3/uL (ref 0.00–0.07)
Basophils Absolute: 0 10*3/uL (ref 0.0–0.1)
Basophils Relative: 0 %
Eosinophils Absolute: 0.2 10*3/uL (ref 0.0–0.5)
Eosinophils Relative: 4 %
HCT: 29.7 % — ABNORMAL LOW (ref 36.0–46.0)
Hemoglobin: 9.7 g/dL — ABNORMAL LOW (ref 12.0–15.0)
Immature Granulocytes: 1 %
Lymphocytes Relative: 34 %
Lymphs Abs: 1.6 10*3/uL (ref 0.7–4.0)
MCH: 32.7 pg (ref 26.0–34.0)
MCHC: 32.7 g/dL (ref 30.0–36.0)
MCV: 100 fL (ref 80.0–100.0)
Monocytes Absolute: 0.8 10*3/uL (ref 0.1–1.0)
Monocytes Relative: 16 %
Neutro Abs: 2.2 10*3/uL (ref 1.7–7.7)
Neutrophils Relative %: 45 %
Platelet Count: 76 10*3/uL — ABNORMAL LOW (ref 150–400)
RBC: 2.97 MIL/uL — AB (ref 3.87–5.11)
RDW: 23.3 % — ABNORMAL HIGH (ref 11.5–15.5)
WBC: 4.8 10*3/uL (ref 4.0–10.5)
nRBC: 0 % (ref 0.0–0.2)

## 2018-02-19 LAB — CMP (CANCER CENTER ONLY)
ALK PHOS: 115 U/L (ref 38–126)
ALT: 14 U/L (ref 0–44)
AST: 18 U/L (ref 15–41)
Albumin: 3.2 g/dL — ABNORMAL LOW (ref 3.5–5.0)
Anion gap: 6 (ref 5–15)
BILIRUBIN TOTAL: 0.5 mg/dL (ref 0.3–1.2)
BUN: 20 mg/dL (ref 8–23)
CO2: 25 mmol/L (ref 22–32)
Calcium: 8.7 mg/dL — ABNORMAL LOW (ref 8.9–10.3)
Chloride: 111 mmol/L (ref 98–111)
Creatinine: 2.3 mg/dL — ABNORMAL HIGH (ref 0.44–1.00)
GFR, Est AFR Am: 23 mL/min — ABNORMAL LOW (ref >60)
GFR, Estimated: 20 mL/min — ABNORMAL LOW (ref >60)
Glucose, Bld: 106 mg/dL — ABNORMAL HIGH (ref 70–99)
Potassium: 4.3 mmol/L (ref 3.5–5.1)
Sodium: 142 mmol/L (ref 135–145)
TOTAL PROTEIN: 5.6 g/dL — AB (ref 6.5–8.1)

## 2018-02-19 MED ORDER — DEXAMETHASONE SODIUM PHOSPHATE 10 MG/ML IJ SOLN
10.0000 mg | Freq: Once | INTRAMUSCULAR | Status: AC
  Administered 2018-02-19: 10 mg via INTRAVENOUS

## 2018-02-19 MED ORDER — ONDANSETRON HCL 8 MG PO TABS
ORAL_TABLET | ORAL | Status: AC
  Filled 2018-02-19: qty 1

## 2018-02-19 MED ORDER — ONDANSETRON HCL 8 MG PO TABS
8.0000 mg | ORAL_TABLET | Freq: Once | ORAL | Status: AC
  Administered 2018-02-19: 8 mg via ORAL

## 2018-02-19 MED ORDER — SODIUM CHLORIDE 0.9 % IV SOLN
Freq: Once | INTRAVENOUS | Status: AC
  Administered 2018-02-19: 14:00:00 via INTRAVENOUS
  Filled 2018-02-19: qty 250

## 2018-02-19 MED ORDER — DEXTROSE 5 % IV SOLN
27.0000 mg/m2 | Freq: Once | INTRAVENOUS | Status: AC
  Administered 2018-02-19: 50 mg via INTRAVENOUS
  Filled 2018-02-19: qty 15

## 2018-02-19 MED ORDER — DEXAMETHASONE SODIUM PHOSPHATE 10 MG/ML IJ SOLN
INTRAMUSCULAR | Status: AC
  Filled 2018-02-19: qty 1

## 2018-02-19 MED ORDER — SODIUM CHLORIDE 0.9 % IV SOLN
225.0000 mg/m2 | Freq: Once | INTRAVENOUS | Status: AC
  Administered 2018-02-19: 420 mg via INTRAVENOUS
  Filled 2018-02-19: qty 21

## 2018-02-19 NOTE — Telephone Encounter (Signed)
Scheduled appt per 02/05 los.  Printed calendar and avs.

## 2018-02-19 NOTE — Progress Notes (Signed)
[place] [phone]   [phone]  OFFICE PROGRESS NOTE  [name], [address] [place] [address]  DIAGNOSIS:  1. 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 Recurrent multiple myeloma initially diagnosed as a smoldering myeloma at [facility] in 2002. 3. Ductal carcinoma in situ status post mastectomy with sentinel lymph node biopsy in October 2008.  PRIOR THERAPY: 1. Status post treatment with tamoxifen from November 2008 through February 2009, discontinued secondary to intolerance. 2. Status post 3 cycles of chemotherapy with Revlimid and Decadron followed by 1 cycle of Decadron only with mild response. 3. Status post 2 cycles of systemic chemotherapy with Velcade, Doxil and Decadron discontinued secondary to significant peripheral neuropathy. Last dose was given May 2010 at [facility]. 4. Status post autologous peripheral blood stem cell transplant on October 01, 2008 at [facility] under the care of Dr. Phyllis Ginger.  5. Systemic chemotherapy with Carfilzomib initially was 20 mg/M2 and will be increased after cycle #1 to 36 mg/M2 on days 1, 2, 8, 9, 15 and 16 every 4 weeks in addition to Cytoxan 300 mg/M2 and Decadron 40 mg by mouth weekly basis, status post 4 cycles. First cycle on 12/29/2012. 6. She resumed chemotherapy with Carfilzomib, Cytoxan, and Decadron on 05/03/2014. Status post 6 cycles. 7. systemic chemotherapy again with Carfilzomib, Cytoxan and Decadron on 07/11/2015. Status post 3 cycles. 8. She completed a course of treatment with Diflucan 200 mg by mouth daily for around 6 weeks for cryptococcal infection of the lung.  CURRENT THERAPY: Resuming treatment again with Carfilzomib, Cytoxan and Decadron, first cycle 04/23/2016. Status post 8 cycles.  Treatment is currently on hold since December 2018.  The patient will resume her treatment today December 25, 2017.  INTERVAL HISTORY: Nancy Norris 78 y.o. female returns to the clinic today for follow-up visit.  The patient is feeling fine today with no concerning complaints.  She received 2 units of PRBCs transfusion few weeks ago for severe anemia.  She denied having any current chest pain but has shortness of breath with exertion with no cough or hemoptysis.  She denied having any recent weight loss or night sweats.  She has no nausea, vomiting, diarrhea or constipation.  She continues to tolerate her treatment with chemotherapy fairly well.  She is here for evaluation before starting cycle #9.  MEDICAL HISTORY: Past Medical History:  Diagnosis Date  . Anemia   . Anginal pain ([place])    used NTG x 2 May 31 and 06/15/13   . Anxiety   . Arthritis   . B12 deficiency 12/04/2014  . Breast cancer ([place])   . Complication of anesthesia   . COPD (chronic obstructive pulmonary disease) ([place])   . Cryptococcal pneumonitis ([place]) 11/22/2015  . Depression   . Dizziness   . Dyspnea   . Fibromyalgia   . Fibromyalgia   . GERD (gastroesophageal reflux disease)   . Headache(784.0)   . Heart murmur   . Hemoptysis 10/21/2015  . History of blood transfusion    last one May 12   . Hx of cardiovascular stress test    LexiScan with low level exercise Myoview (02/2013): No ischemia, EF 72%; normal study  . Hx of echocardiogram    a.  Echocardiogram (12/26/2012): EF 97-98%, grade 1 diastolic dysfunction;   b.  Echocardiogram (02/2013): EF 55-60%, no WMA, trivial effusion  . Hyperkalemia   .  Hyponatremia   . Hypotension   . Hypothyroidism   . Mucositis   . Multiple myeloma   . Myocardial infarction [facility])    in past, patient was unaware.   . Neuropathy   . Nodule of left lung 09/13/2015  . Pain in joint, pelvic region and thigh 07/07/2015  . Pneumonia    several  . PONV (postoperative nausea and vomiting) 2008   after mastestomy    ALLERGIES:  is allergic to codeine; latex; chocolate; onion; oxycodone;  zyprexa [olanzapine]; adhesive [tape]; heparin; hydrocodone; iodinated diagnostic agents; promethazine-dm; and sulfa antibiotics.  MEDICATIONS:    SURGICAL HISTORY:  Past Surgical History:  Procedure Laterality Date  . ABDOMINAL HYSTERECTOMY  1981  . AV FISTULA PLACEMENT Left 06/19/2013   Procedure: CREATION OF LEFT ARM ARTERIOVENOUS (AV) FISTULA ;  Surgeon: Angelia Mould, MD;  Location: [place];  Service: Vascular;  Laterality: Left;  . BREAST RECONSTRUCTION    . BREAST SURGERY Right    reduction  . CATARACT EXTRACTION, BILATERAL    . CHOLECYSTECTOMY  1971  . COLONOSCOPY    . EYE SURGERY Bilateral    lens implant  . history of Port removal    . LUNG BIOPSY  10/21/2015  . MASTECTOMY Left 2008  . PORTACATH PLACEMENT  12/2012   has had 2  . RESECTION OF ARTERIOVENOUS FISTULA ANEURYSM Left 06/04/2017   Procedure: LIGATION ANEURYSM OF LEFT ARTERIOVENOUS FISTULA;  Surgeon: Angelia Mould, MD;  Location: [place];  Service: Vascular;  Laterality: Left;  . Status post stem cell transplant on September 28, 2008.      REVIEW OF SYSTEMS:  A comprehensive review of systems was negative except for: Constitutional: positive for fatigue Respiratory: positive for dyspnea on exertion   PHYSICAL EXAMINATION: General appearance: alert, cooperative, fatigued and no distress Head: Normocephalic, without obvious abnormality, atraumatic Neck: no adenopathy, no JVD, supple, symmetrical, trachea midline and thyroid not enlarged, symmetric, no tenderness/mass/nodules Lymph nodes: Cervical, supraclavicular, and axillary nodes normal. Resp: clear to auscultation bilaterally Back: symmetric, no curvature. ROM normal. No CVA tenderness. Cardio: regular rate and rhythm, S1, S2 normal, no murmur, click, rub or gallop GI: soft, non-tender; bowel sounds normal; no masses,  no organomegaly Extremities: extremities normal, atraumatic, no cyanosis or edema  ECOG PERFORMANCE STATUS: 1 - Symptomatic but  completely ambulatory  Blood pressure 127/79, pulse 81, temperature 98.8 F (37.1 C), temperature source Oral, resp. rate 20, height '5\' 5"'$  (1.651 m), weight 177 lb 9.6 oz (80.6 kg), SpO2 98 %.  LABORATORY DATA: Lab Results  Component Value Date   WBC 4.8 02/19/2018   HGB 9.7 (L) 02/19/2018   HCT 29.7 (L) 02/19/2018   MCV 100.0 02/19/2018   PLT 76 (L) 02/19/2018      Chemistry      Component Value Date/Time   NA 141 02/06/2018 1239   NA 144 01/17/2017 1146   K 4.6 02/06/2018 1239   K 3.7 01/17/2017 1146   CL 108 02/06/2018 1239   CL 105 03/19/2012 0811   CO2 24 02/06/2018 1239   CO2 25 01/17/2017 1146   BUN 23 02/06/2018 1239   BUN 35.0 (H) 01/17/2017 1146   CREATININE 2.53 (H) 02/06/2018 1239   CREATININE 3.16 (H) 12/18/2017 1137   CREATININE 2.8 (H) 01/17/2017 1146      Component Value Date/Time   CALCIUM 8.6 (L) 02/06/2018 1239   CALCIUM 9.1 01/17/2017 1146   ALKPHOS 93 02/06/2018 1239   ALKPHOS 110 01/17/2017 1146  AST 10 (L) 02/06/2018 1239   AST 20 01/17/2017 1146   ALT 14 02/06/2018 1239   ALT 22 01/17/2017 1146   BILITOT 0.9 02/06/2018 1239   BILITOT 0.73 01/17/2017 1146       RADIOGRAPHIC STUDIES: Dg Chest 2 View  Result Date: 01/20/2018 CLINICAL DATA:  Shortness of breath for 1 week. Active treatment for multiple myeloma. EXAM: CHEST - 2 VIEW COMPARISON:  CT 03/17/2017 FINDINGS: Tip of the right chest port tip the atrial caval junction. Chronic eventration of right hemidiaphragm. Linear scarring in the left mid lung. Small nodular opacities in the right upper lung zone, with nodules seen is region on prior CT. Unchanged heart size and mediastinal contours with aortic atherosclerosis. No acute airspace disease, pleural effusion, or pneumothorax. No acute osseous abnormalities. IMPRESSION: 1. No acute findings. 2. Small nodular opacities in the right upper lung zone, corresponding to nodules seen on March 2019 CT. Electronically Signed   By: Keith Rake  M.D.   On: 01/20/2018 20:30   Ct Chest Wo Contrast  Result Date: 01/20/2018 CLINICAL DATA:  Dyspnea, diarrhea and weakness times 10 days. EXAM: CT CHEST WITHOUT CONTRAST TECHNIQUE: Multidetector CT imaging of the chest was performed following the standard protocol without IV contrast. COMPARISON:  03/17/2017 chest CT and 01/10/2016 chest CT FINDINGS: Cardiovascular: Top normal heart size with trace anterior pericardial effusion. Moderate three-vessel coronary arteriosclerosis is redemonstrated. Dilatation of the main pulmonary artery to 3.1 cm compatible with chronic mild pulmonary hypertension noted. Nonaneurysmal atherosclerotic aorta calcifications noted at the arch and descending portion. Mediastinum/Nodes: No lymphadenopathy of the included base of neck, axilla, mediastinum nor hila. Hilar adenopathy is limited due to lack IV contrast. No thyromegaly or mass. Patent trachea and mainstem bronchi. The CT appearance of the esophagus is unremarkable. Lungs/Pleura: Lingular bandlike scarring is redemonstrated. Right upper lobe, somewhat circumferential opacity with central lucency is noted, series 5/41 though on coronal reformats this may be due to imaging of linear scarring and/or atelectasis in the axial plane simulating a lesion, series 6/62 of the coronal images and series 5/40 the axial images. Findings are new since prior and may represent postinfectious or inflammatory change. Attention follow-up is recommended to exclude other etiologies including neoplasm. Tiny subpleural nodule in the right middle lobe is stable likely postinfectious or inflammatory measuring 3 mm. Additional subpleural right lower lobe lateral basal segment nodule measuring approximately 2 mm, series 05/18. Subsegmental atelectasis is seen at each base. No effusion or pneumothorax. Upper Abdomen: Bilateral renal cortical scarring. Stable hypodense left adrenal nodule measuring 18 mm compatible a benign adenoma. Right adrenal gland is  normal. No space-occupying mass of the included liver. Mild pancreatic fatty infiltration. Normal size included spleen. Musculoskeletal: No aggressive osseous lesions. No acute fracture. IMPRESSION: 1. Moderate three-vessel coronary arteriosclerosis. 2. Dilatation of the main pulmonary artery to 3.1 cm compatible with chronic pulmonary hypertension. 3. Right upper lobe circumferential opacity with central lucency may represent postinfectious or inflammatory change. Attention to this area on follow-up is recommended and to exclude neoplasm. On coronal reformats this appears more linear less masslike and is more likely related to postinfectious or inflammatory change. 4. Chronic lingular scarring and/or atelectasis. 5. Stable few mm right middle and lower lobe nodular densities. 6. Stable left adrenal adenoma measuring 18 mm. 7. Bilateral renal cortical scarring. Aortic Atherosclerosis (ICD10-I70.0). Electronically Signed   By: Ashley Royalty M.D.   On: 01/20/2018 22:29    ASSESSMENT AND PLAN:  This is a very pleasant 77 years  old white female with relapsed multiple myeloma status post several chemotherapy regimens and she is currently on treatment with systemic chemotherapy with Carfilzomib, Cytoxan and Decadron status post 17 cycles.  She has been off treatment for the last 12 months. The patient resumed her treatment again status post 2 more cycles. She continues to tolerate this treatment well except for fatigue. I recommended for her to proceed with cycle #3 of the new regimen today as scheduled. I will see her back for follow-up visit in 4 weeks for evaluation with repeat myeloma panel. The patient was advised to call immediately if she has any concerning symptoms in the interval. The patient voices understanding of current disease status and treatment options and is in agreement with the current care plan. All questions were answered. The patient knows to call the clinic with any problems, questions or  concerns. We can certainly see the patient much sooner if necessary.  Disclaimer: This note was dictated with voice recognition software. Similar sounding words can inadvertently be transcribed and may not be corrected upon review.

## 2018-02-19 NOTE — Progress Notes (Signed)
Okay to treat with Plts 76, and Cr. 2.3, per Dr. Julien Nordmann.

## 2018-02-19 NOTE — Patient Instructions (Signed)
[facility] Discharge Instructions for Patients Receiving Chemotherapy  Today you received the following chemotherapy agents:  Cytoxan & Kyprolis  To help prevent nausea and vomiting after your treatment, we encourage you to take your nausea medication as prescribed.   If you develop nausea and vomiting that is not controlled by your nausea medication, call the clinic.   BELOW ARE SYMPTOMS THAT SHOULD BE REPORTED IMMEDIATELY:  *FEVER GREATER THAN 100.5 F  *CHILLS WITH OR WITHOUT FEVER  NAUSEA AND VOMITING THAT IS NOT CONTROLLED WITH YOUR NAUSEA MEDICATION  *UNUSUAL SHORTNESS OF BREATH  *UNUSUAL BRUISING OR BLEEDING  TENDERNESS IN MOUTH AND THROAT WITH OR WITHOUT PRESENCE OF ULCERS  *URINARY PROBLEMS  *BOWEL PROBLEMS  UNUSUAL RASH Items with * indicate a potential emergency and should be followed up as soon as possible.  Feel free to call the clinic should you have any questions or concerns. The clinic phone number is [phone].  Please show the [place] at check-in to the Emergency Department and triage nurse.  Return tomorrow for 2 units of blood & kyprolis.

## 2018-02-20 ENCOUNTER — Inpatient Hospital Stay: Payer: Medicare Other

## 2018-02-25 DIAGNOSIS — M25551 Pain in right hip: Secondary | ICD-10-CM | POA: Diagnosis not present

## 2018-02-25 DIAGNOSIS — M25512 Pain in left shoulder: Secondary | ICD-10-CM | POA: Diagnosis not present

## 2018-02-26 ENCOUNTER — Inpatient Hospital Stay: Payer: Medicare Other

## 2018-02-26 ENCOUNTER — Other Ambulatory Visit: Payer: Self-pay

## 2018-02-26 ENCOUNTER — Encounter (HOSPITAL_COMMUNITY): Payer: Self-pay | Admitting: Emergency Medicine

## 2018-02-26 ENCOUNTER — Emergency Department (HOSPITAL_COMMUNITY): Payer: Medicare Other

## 2018-02-26 ENCOUNTER — Emergency Department (HOSPITAL_COMMUNITY)
Admission: EM | Admit: 2018-02-26 | Discharge: 2018-02-26 | Disposition: A | Discharge status: Home / Self Care | Payer: Medicare Other | Attending: Emergency Medicine | Admitting: Emergency Medicine

## 2018-02-26 DIAGNOSIS — N390 Urinary tract infection, site not specified: Secondary | ICD-10-CM | POA: Diagnosis not present

## 2018-02-26 DIAGNOSIS — D354 Benign neoplasm of pineal gland: Secondary | ICD-10-CM | POA: Diagnosis not present

## 2018-02-26 DIAGNOSIS — N185 Chronic kidney disease, stage 5: Secondary | ICD-10-CM | POA: Insufficient documentation

## 2018-02-26 DIAGNOSIS — F419 Anxiety disorder, unspecified: Secondary | ICD-10-CM | POA: Diagnosis not present

## 2018-02-26 DIAGNOSIS — C9000 Multiple myeloma not having achieved remission: Secondary | ICD-10-CM | POA: Insufficient documentation

## 2018-02-26 DIAGNOSIS — J45909 Unspecified asthma, uncomplicated: Secondary | ICD-10-CM | POA: Diagnosis not present

## 2018-02-26 DIAGNOSIS — J449 Chronic obstructive pulmonary disease, unspecified: Secondary | ICD-10-CM | POA: Diagnosis not present

## 2018-02-26 DIAGNOSIS — F329 Major depressive disorder, single episode, unspecified: Secondary | ICD-10-CM | POA: Insufficient documentation

## 2018-02-26 DIAGNOSIS — I120 Hypertensive chronic kidney disease with stage 5 chronic kidney disease or end stage renal disease: Secondary | ICD-10-CM | POA: Insufficient documentation

## 2018-02-26 DIAGNOSIS — Z91040 Latex allergy status: Secondary | ICD-10-CM | POA: Insufficient documentation

## 2018-02-26 DIAGNOSIS — E039 Hypothyroidism, unspecified: Secondary | ICD-10-CM | POA: Insufficient documentation

## 2018-02-26 DIAGNOSIS — D696 Thrombocytopenia, unspecified: Secondary | ICD-10-CM | POA: Diagnosis not present

## 2018-02-26 DIAGNOSIS — Z87891 Personal history of nicotine dependence: Secondary | ICD-10-CM | POA: Diagnosis not present

## 2018-02-26 DIAGNOSIS — R51 Headache: Secondary | ICD-10-CM | POA: Diagnosis present

## 2018-02-26 DIAGNOSIS — Z9049 Acquired absence of other specified parts of digestive tract: Secondary | ICD-10-CM | POA: Insufficient documentation

## 2018-02-26 DIAGNOSIS — I252 Old myocardial infarction: Secondary | ICD-10-CM | POA: Insufficient documentation

## 2018-02-26 DIAGNOSIS — R55 Syncope and collapse: Secondary | ICD-10-CM | POA: Diagnosis not present

## 2018-02-26 DIAGNOSIS — Z79899 Other long term (current) drug therapy: Secondary | ICD-10-CM | POA: Diagnosis not present

## 2018-02-26 DIAGNOSIS — Z853 Personal history of malignant neoplasm of breast: Secondary | ICD-10-CM | POA: Diagnosis not present

## 2018-02-26 DIAGNOSIS — Z95828 Presence of other vascular implants and grafts: Secondary | ICD-10-CM

## 2018-02-26 DIAGNOSIS — R413 Other amnesia: Secondary | ICD-10-CM | POA: Diagnosis not present

## 2018-02-26 LAB — COMPREHENSIVE METABOLIC PANEL
ALT: 10 U/L (ref 0–44)
AST: 15 U/L (ref 15–41)
Albumin: 3 g/dL — ABNORMAL LOW (ref 3.5–5.0)
Alkaline Phosphatase: 74 U/L (ref 38–126)
Anion gap: 6 (ref 5–15)
BUN: 18 mg/dL (ref 8–23)
CO2: 23 mmol/L (ref 22–32)
CREATININE: 2.29 mg/dL — AB (ref 0.44–1.00)
Calcium: 8.1 mg/dL — ABNORMAL LOW (ref 8.9–10.3)
Chloride: 111 mmol/L (ref 98–111)
GFR calc Af Amer: 23 mL/min — ABNORMAL LOW (ref >60)
GFR, EST NON AFRICAN AMERICAN: 20 mL/min — AB (ref >60)
Glucose, Bld: 117 mg/dL — ABNORMAL HIGH (ref 70–99)
Potassium: 3.5 mmol/L (ref 3.5–5.1)
Sodium: 140 mmol/L (ref 135–145)
Total Bilirubin: 0.7 mg/dL (ref 0.3–1.2)
Total Protein: 5.1 g/dL — ABNORMAL LOW (ref 6.5–8.1)

## 2018-02-26 LAB — CBC WITH DIFFERENTIAL/PLATELET
Abs Immature Granulocytes: 0.02 10*3/uL (ref 0.00–0.07)
Basophils Absolute: 0 10*3/uL (ref 0.0–0.1)
Basophils Relative: 1 %
Eosinophils Absolute: 0.2 10*3/uL (ref 0.0–0.5)
Eosinophils Relative: 6 %
HEMATOCRIT: 25.3 % — AB (ref 36.0–46.0)
Hemoglobin: 7.9 g/dL — ABNORMAL LOW (ref 12.0–15.0)
Immature Granulocytes: 1 %
LYMPHS ABS: 1 10*3/uL (ref 0.7–4.0)
Lymphocytes Relative: 34 %
MCH: 31.3 pg (ref 26.0–34.0)
MCHC: 31.2 g/dL (ref 30.0–36.0)
MCV: 100.4 fL — AB (ref 80.0–100.0)
Monocytes Absolute: 0.5 10*3/uL (ref 0.1–1.0)
Monocytes Relative: 15 %
Neutro Abs: 1.3 10*3/uL — ABNORMAL LOW (ref 1.7–7.7)
Neutrophils Relative %: 43 %
Platelets: 27 10*3/uL — CL (ref 150–400)
RBC: 2.52 MIL/uL — ABNORMAL LOW (ref 3.87–5.11)
RDW: 22.9 % — ABNORMAL HIGH (ref 11.5–15.5)
WBC: 3 10*3/uL — ABNORMAL LOW (ref 4.0–10.5)
nRBC: 0 % (ref 0.0–0.2)

## 2018-02-26 LAB — CMP (CANCER CENTER ONLY)
ALT: 7 U/L (ref 0–44)
AST: 9 U/L — ABNORMAL LOW (ref 15–41)
Albumin: 3 g/dL — ABNORMAL LOW (ref 3.5–5.0)
Alkaline Phosphatase: 83 U/L (ref 38–126)
Anion gap: 9 (ref 5–15)
BUN: 19 mg/dL (ref 8–23)
CO2: 22 mmol/L (ref 22–32)
Calcium: 8.1 mg/dL — ABNORMAL LOW (ref 8.9–10.3)
Chloride: 112 mmol/L — ABNORMAL HIGH (ref 98–111)
Creatinine: 2.36 mg/dL — ABNORMAL HIGH (ref 0.44–1.00)
GFR, Est AFR Am: 22 mL/min — ABNORMAL LOW (ref >60)
GFR, Estimated: 19 mL/min — ABNORMAL LOW (ref >60)
Glucose, Bld: 109 mg/dL — ABNORMAL HIGH (ref 70–99)
Potassium: 4 mmol/L (ref 3.5–5.1)
Sodium: 143 mmol/L (ref 135–145)
TOTAL PROTEIN: 5.4 g/dL — AB (ref 6.5–8.1)
Total Bilirubin: 0.9 mg/dL (ref 0.3–1.2)

## 2018-02-26 LAB — URINALYSIS, ROUTINE W REFLEX MICROSCOPIC
Bilirubin Urine: NEGATIVE
Glucose, UA: 50 mg/dL — AB
Ketones, ur: NEGATIVE mg/dL
Nitrite: NEGATIVE
Protein, ur: 30 mg/dL — AB
Specific Gravity, Urine: 1.011 (ref 1.005–1.030)
pH: 6 (ref 5.0–8.0)

## 2018-02-26 LAB — CBC WITH DIFFERENTIAL (CANCER CENTER ONLY)
Abs Immature Granulocytes: 0.02 10*3/uL (ref 0.00–0.07)
Basophils Absolute: 0 10*3/uL (ref 0.0–0.1)
Basophils Relative: 1 %
Eosinophils Absolute: 0.2 10*3/uL (ref 0.0–0.5)
Eosinophils Relative: 5 %
HCT: 26.6 % — ABNORMAL LOW (ref 36.0–46.0)
Hemoglobin: 8.6 g/dL — ABNORMAL LOW (ref 12.0–15.0)
Immature Granulocytes: 1 %
Lymphocytes Relative: 33 %
Lymphs Abs: 1.1 10*3/uL (ref 0.7–4.0)
MCH: 31.7 pg (ref 26.0–34.0)
MCHC: 32.3 g/dL (ref 30.0–36.0)
MCV: 98.2 fL (ref 80.0–100.0)
MONOS PCT: 13 %
Monocytes Absolute: 0.4 10*3/uL (ref 0.1–1.0)
NEUTROS PCT: 47 %
Neutro Abs: 1.6 10*3/uL — ABNORMAL LOW (ref 1.7–7.7)
Platelet Count: 28 10*3/uL — ABNORMAL LOW (ref 150–400)
RBC: 2.71 MIL/uL — ABNORMAL LOW (ref 3.87–5.11)
RDW: 22.8 % — AB (ref 11.5–15.5)
WBC Count: 3.3 10*3/uL — ABNORMAL LOW (ref 4.0–10.5)
nRBC: 0 % (ref 0.0–0.2)

## 2018-02-26 LAB — TYPE AND SCREEN
ABO/RH(D): O POS
Antibody Screen: NEGATIVE

## 2018-02-26 LAB — I-STAT TROPONIN, ED: Troponin i, poc: 0.05 ng/mL (ref 0.00–0.08)

## 2018-02-26 LAB — CK: Total CK: 10 U/L — ABNORMAL LOW (ref 38–234)

## 2018-02-26 MED ORDER — SODIUM CHLORIDE 0.9 % IV BOLUS
1000.0000 mL | Freq: Once | INTRAVENOUS | Status: AC
  Administered 2018-02-26: 1000 mL via INTRAVENOUS

## 2018-02-26 MED ORDER — HEPARIN SOD (PORK) LOCK FLUSH 100 UNIT/ML IV SOLN
500.0000 [IU] | Freq: Once | INTRAVENOUS | Status: AC
  Administered 2018-02-26: 500 [IU]
  Filled 2018-02-26: qty 5

## 2018-02-26 MED ORDER — SODIUM CHLORIDE 0.9% FLUSH
10.0000 mL | INTRAVENOUS | Status: DC | PRN
  Administered 2018-02-26: 10 mL via INTRAVENOUS
  Filled 2018-02-26: qty 10

## 2018-02-26 MED ORDER — CEPHALEXIN 500 MG PO CAPS: 500.0000 mg | ORAL_CAPSULE | Freq: Three times a day (TID) | ORAL | 0 refills | Status: DC

## 2018-02-26 MED ORDER — SODIUM CHLORIDE 0.9 % IV SOLN
1.0000 g | Freq: Once | INTRAVENOUS | Status: AC
  Administered 2018-02-26: 1 g via INTRAVENOUS
  Filled 2018-02-26: qty 10

## 2018-02-26 NOTE — Discharge Instructions (Signed)
Take keflex three times daily for a week.   Stay hydrated.   Call your oncologist tomorrow. You will need repeat blood count in a week. Your hemoglobin and platelets are low but your oncologist want to hold off on transfusion currently   Return to ER if you have another fall, headaches, vomiting, fever.

## 2018-02-26 NOTE — ED Provider Notes (Signed)
[facility] DEPT Provider Note   CSN: [id number] Arrival date & time: 02/26/18  1451     History   Chief Complaint Chief Complaint  Patient presents with  . Memory Loss  . Headache  . Loss of Consciousness  . Fall    HPI Nancy Norris is a 78 y.o. female history of COPD, depression, fibromyalgia, multiple myeloma on chemotherapy here presenting with headache, loss of consciousness.  Patient states that she passed out several days ago and hit her head on the right side of her temple.  She then had some headaches.  She states that she has recurrent episodes over the last several weeks.  Several days ago, she had an episode where she passed out and was covered in stool afterwards.  Patient had no known seizure-like activity and is not currently on any antiepileptic.  Patient made several doses of her chemotherapy and went to the office today and was noted to have a platelet count of 28 and a hemoglobin 8.6 and sent here for further evaluation.  Patient denies any fevers or vomiting or blood in her stool.  She does have underlying multiple myeloma but there is no known mets in her brain or skull.   The history is provided by the patient.    Past Medical History:  Diagnosis Date  . Anemia   . Anginal pain ([place])    used NTG x 2 May 31 and 06/15/13   . Anxiety   . Arthritis   . B12 deficiency 12/04/2014  . Breast cancer ([place])   . Complication of anesthesia   . COPD (chronic obstructive pulmonary disease) ([place])   . Cryptococcal pneumonitis ([place]) 11/22/2015  . Depression   . Dizziness   . Dyspnea   . Fibromyalgia   . Fibromyalgia   . GERD (gastroesophageal reflux disease)   . Headache(784.0)   . Heart murmur   . Hemoptysis 10/21/2015  . History of blood transfusion    last one May 12   . Hx of cardiovascular stress test    LexiScan with low level exercise Myoview (02/2013): No ischemia, EF 72%; normal study  . Hx of echocardiogram    a.   Echocardiogram (12/26/2012): EF 70-26%, grade 1 diastolic dysfunction;   b.  Echocardiogram (02/2013): EF 55-60%, no WMA, trivial effusion  . Hyperkalemia   . Hyponatremia   . Hypotension   . Hypothyroidism   . Mucositis   . Multiple myeloma   . Myocardial infarction [facility])    in past, patient was unaware.   . Neuropathy   . Nodule of left lung 09/13/2015  . Pain in joint, pelvic region and thigh 07/07/2015  . Pneumonia    several  . PONV (postoperative nausea and vomiting) 2008   after mastestomy    Patient Active Problem List   Diagnosis Date Noted  . Port catheter in place 09/18/2016  . Cryptococcal pneumonitis ([place]) 11/22/2015  . Anemia in neoplastic disease 03/31/2015  . B12 deficiency 12/04/2014  . Thrombocytopenia ([place]) 12/04/2014  . Encounter for antineoplastic chemotherapy 06/21/2014  . CKD (chronic kidney disease) stage 5, GFR less than 15 ml/min (HCC) 04/02/2014  . Multiple myeloma ([place]) 12/02/2013  . Major depressive disorder, recurrent episode ([place]) 11/02/2013  . Medication management 09/30/2013  . Vitamin D deficiency 03/24/2013  . Mixed hyperlipidemia 02/24/2013  . COPD (chronic obstructive pulmonary disease) with emphysema ([place]) 02/20/2013  . GERD (gastroesophageal reflux disease) 01/20/2013  . HTN (hypertension) 01/14/2013  . Asthma, chronic  01/13/2013  . Hypothyroid 01/13/2013  . Chronic diastolic heart failure ([place]) 01/13/2013  . HX: breast cancer 12/08/2012  . Hypercalcemia 12/02/2012    Past Surgical History:  Procedure Laterality Date  . ABDOMINAL HYSTERECTOMY  1981  . AV FISTULA PLACEMENT Left 06/19/2013   Procedure: CREATION OF LEFT ARM ARTERIOVENOUS (AV) FISTULA ;  Surgeon: Angelia Mould, MD;  Location: [facility];  Service: Vascular;  Laterality: Left;  . BREAST RECONSTRUCTION    . BREAST SURGERY Right    reduction  . CATARACT EXTRACTION, BILATERAL    . CHOLECYSTECTOMY  1971  . COLONOSCOPY    . EYE SURGERY Bilateral    lens implant  .  history of Port removal    . LUNG BIOPSY  10/21/2015  . MASTECTOMY Left 2008  . PORTACATH PLACEMENT  12/2012   has had 2  . RESECTION OF ARTERIOVENOUS FISTULA ANEURYSM Left 06/04/2017   Procedure: LIGATION ANEURYSM OF LEFT ARTERIOVENOUS FISTULA;  Surgeon: Angelia Mould, MD;  Location: [facility];  Service: Vascular;  Laterality: Left;  . Status post stem cell transplant on September 28, 2008.       OB History   No obstetric history on file.      Home Medications    Prior to Admission medications   Medication Sig Start Date End Date Taking? Authorizing Provider  acetaminophen (TYLENOL) 500 MG tablet Take 1,000 mg by mouth daily as needed for moderate pain or headache.   Yes [provider]  albuterol (PROVENTIL HFA;VENTOLIN HFA) 108 (90 Base) MCG/ACT inhaler Inhale 1-2 puffs into the lungs every 6 (six) hours as needed for wheezing or shortness of breath (cough). 01/10/18  Yes McClanahan, Danton Sewer, NP  azelastine (ASTELIN) 0.1 % nasal spray Place 1 spray into both nostrils daily. Use in each nostril as directed 01/10/18  Yes McClanahan, Kyra, NP  buPROPion (WELLBUTRIN) 75 MG tablet Take 1 tablet (75 mg total) by mouth 2 (two) times daily. 05/29/17 05/29/18 Yes Liane Comber, NP  citalopram (CELEXA) 40 MG tablet Take 1 tablet daily for Mood 10/18/17  Yes [name], MD  cromolyn (OPTICROM) 4 % ophthalmic solution INSTILL 1 TO 2 DROPS IN AFFECTED EYE(S) 4 TIMES A DAY FOR ALLERGIES. Patient taking differently: Place 1-2 drops into both eyes 4 (four) times daily as needed (allergies).  12/27/17  Yes Liane Comber, NP  dexamethasone (DECADRON) 4 MG tablet Take 4 mg by mouth. 5 tabs on day 1 of chemo 12/18/17  Yes [provider]  folic acid (FOLVITE) 1 MG tablet Take 1 tablet (1 mg total) by mouth daily. 03/19/17 03/19/18 Yes Buriev, Arie Sabina, MD  furosemide (LASIX) 40 MG tablet TAKE 1 TABLET BY MOUTH DAILY. Patient taking differently: Take 40 mg by mouth daily as needed  for fluid or edema.  12/03/16  Yes [name], MD  gabapentin (NEURONTIN) 300 MG capsule TAKE 1 CAPSULE BY MOUTH 3 TIMES DAILY. Patient taking differently: Take 300-600 mg by mouth at bedtime. And an additional 300 mg during the day if "cramping" 02/17/18  Yes [name], MD  ipratropium-albuterol (DUONEB) 0.5-2.5 (3) MG/3ML SOLN Take 3 mLs by nebulization every 4 (four) hours as needed. Max:6 doses per day 01/10/18  Yes McClanahan, Kyra, NP  levocetirizine (XYZAL) 5 MG tablet Take 1 tablet (5 mg total) by mouth every evening. 01/10/18 01/11/19 Yes McClanahan, Kyra, NP  levothyroxine (SYNTHROID, LEVOTHROID) 150 MCG tablet TAKE 1 TABLET BY MOUTH DAILY. NEED OFFICE VISIT FOR FURTHER REFILLS. Patient taking differently: Take by mouth  daily before breakfast. TAKE 1 TABLET BY MOUTH DAILY. NEED OFFICE VISIT FOR FURTHER REFILLS. 10/03/17  Yes [name], MD  loperamide (IMODIUM) 2 MG capsule Take 1 capsule (2 mg total) by mouth as needed for diarrhea or loose stools. 02/28/16  Yes [name], MD  magic mouthwash SOLN Take 5 mLs by mouth 4 (four) times daily as needed for mouth pain. 10/27/15  Yes Curt Bears, MD  midodrine (PROAMATINE) 10 MG tablet Take 1 tablet daily to support BP 12/03/17  Yes [name], MD  montelukast (SINGULAIR) 10 MG tablet Take 1 tablet daily for Allergies 10/18/17  Yes [name], MD  nystatin (MYCOSTATIN/NYSTOP) powder Apply topically 2 (two) times daily. 01/10/18  Yes McClanahan, Kyra, NP  ondansetron (ZOFRAN ODT) 8 MG disintegrating tablet Take 1 tablet (8 mg total) by mouth every 8 (eight) hours as needed for nausea or vomiting. 12/17/17  Yes Curt Bears, MD  pilocarpine (SALAGEN) 5 MG tablet TAKE 1 TABLET BY MOUTH 3 TIMES DAILY FOR DRY MOUTH. Patient taking differently: Take 5 mg by mouth 2 (two) times daily.  09/26/16  Yes [name], MD  ranitidine (ZANTAC) 300 MG tablet Take 1 tablet 2 x /day at [facility] & Bedtime for Heart burn &  Reflux 09/16/17  Yes [name], MD  sucralfate (CARAFATE) 1 g tablet Take 1 tablet dissolved in water  4 x /day BEFORE Meals & Bedtime for Acid indigestion & Reflux Patient taking differently: Take 1 g by mouth 4 (four) times daily -  with meals and at bedtime. Take 1 tablet dissolved in water  4 x /day BEFORE Meals & Bedtime for Acid indigestion & Reflux 10/04/17  Yes [name], MD  umeclidinium-vilanterol [facility] ELLIPTA) 62.5-25 MCG/INH AEPB Inhale 1 puff into the lungs daily as needed (shortness of breath). 01/10/18  Yes McClanahan, Danton Sewer, NP  vitamin B-12 (CYANOCOBALAMIN) 1000 MCG tablet Take 1 tablet (1,000 mcg total) by mouth daily. Patient taking differently: Take 2,000 mcg by mouth daily.  03/19/17  Yes Kinnie Feil, MD  fluconazole (DIFLUCAN) 150 MG tablet Take one tablet at onset of symptoms and second tablet three days later for yeast. Patient not taking: Reported on 02/19/2018 01/10/18   Garnet Sierras, NP  nitroGLYCERIN (NITROSTAT) 0.4 MG SL tablet Place 0.4 mg under the tongue every 5 (five) minutes as needed for chest pain.     [provider]  traMADol (ULTRAM) 50 MG tablet Take 1 to 2 tablets by mouth 4 times a day as needed for pain. Patient not taking: Reported on 02/26/2018 07/22/15   [name], MD    Family History Family History  Problem Relation Age of Onset  . Arthritis Mother   . Asthma Mother   . Cancer Sister   . Hyperlipidemia Brother     Social History Social History   Tobacco Use  . Smoking status: Former Smoker    Packs/day: 1.00    Years: 30.00    Pack years: 30.00    Types: Cigarettes    Last attempt to quit: 02/15/2005    Years since quitting: 13.0  . Smokeless tobacco: Never Used  Substance Use Topics  . Alcohol use: No  . Drug use: No     Allergies   Codeine; Latex; Chocolate; Onion; Oxycodone; Zyprexa [olanzapine]; Adhesive [tape]; Heparin; Hydrocodone; Iodinated diagnostic agents; Promethazine-dm; and Sulfa  antibiotics   Review of Systems Review of Systems  Cardiovascular: Positive for syncope.  Neurological: Positive for headaches.  All other systems reviewed and are  negative.    Physical Exam Updated Vital Signs BP 135/78 (BP Location: Right Arm)   Pulse 68   Temp 98 F (36.7 C) (Oral)   Resp 13   SpO2 100%   Physical Exam Vitals signs and nursing note reviewed.  Constitutional:      Appearance: She is well-developed.  HENT:     Head:     Comments: Mild R temporal tenderness and mild R temporal hematoma  Eyes:     Extraocular Movements: Extraocular movements intact.     Pupils: Pupils are equal, round, and reactive to light.  Neck:     Musculoskeletal: Normal range of motion and neck supple.  Cardiovascular:     Rate and Rhythm: Normal rate.  Pulmonary:     Effort: Pulmonary effort is normal.     Breath sounds: Normal breath sounds.  Abdominal:     General: Bowel sounds are normal.     Palpations: Abdomen is soft.  Musculoskeletal: Normal range of motion.  Skin:    General: Skin is warm.  Neurological:     Mental Status: She is alert and oriented to person, place, and time.     Cranial Nerves: No cranial nerve deficit.     Comments: A & O x 3, nl strength and sensation throughout   Psychiatric:        Mood and Affect: Mood normal.      ED Treatments / Results  Labs (all labs ordered are listed, but only abnormal results are displayed) Labs Reviewed  CBC WITH DIFFERENTIAL/PLATELET - Abnormal; Notable for the following components:      Result Value   WBC 3.0 (*)    RBC 2.52 (*)    Hemoglobin 7.9 (*)    HCT 25.3 (*)    MCV 100.4 (*)    RDW 22.9 (*)    Platelets 27 (*)    All other components within normal limits  COMPREHENSIVE METABOLIC PANEL - Abnormal; Notable for the following components:   Glucose, Bld 117 (*)    Creatinine, Ser 2.29 (*)    Calcium 8.1 (*)    Total Protein 5.1 (*)    Albumin 3.0 (*)    GFR calc non Af Amer 20 (*)    GFR calc  Af Amer 23 (*)    All other components within normal limits  URINALYSIS, ROUTINE W REFLEX MICROSCOPIC - Abnormal; Notable for the following components:   APPearance CLOUDY (*)    Glucose, UA 50 (*)    Hgb urine dipstick SMALL (*)    Protein, ur 30 (*)    Leukocytes,Ua SMALL (*)    Bacteria, UA MANY (*)    All other components within normal limits  CK - Abnormal; Notable for the following components:   Total CK 10 (*)    All other components within normal limits  URINE CULTURE  I-STAT TROPONIN, ED  TYPE AND SCREEN    EKG None  Radiology Ct Head Wo Contrast  Result Date: 02/26/2018 CLINICAL DATA:  Blackouts for 1 month. History of multiple myeloma. EXAM: CT HEAD WITHOUT CONTRAST TECHNIQUE: Contiguous axial images were obtained from the base of the skull through the vertex without intravenous contrast. COMPARISON:  12/22/2015. FINDINGS: Brain: No evidence for acute infarction, hemorrhage, mass lesion, hydrocephalus, or extra-axial fluid. Mild atrophy. Hypoattenuation of white matter, consistent with small vessel disease or post treatment effect. 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 Vascular: Calcification of the cavernous internal carotid arteries consistent with cerebrovascular atherosclerotic disease.  No signs of intracranial large vessel occlusion. Skull: Calvarium intact. Small lucency in the RIGHT frontal bone (series 3 image 50) unchanged from 2017. Sinuses/Orbits: No acute finding. Other: None. IMPRESSION: Atrophy and small vessel disease. No acute intracranial findings. Electronically Signed   By: Staci Righter M.D.   On: 02/26/2018 18:23   Dg Abd Acute 2+v W 1v Chest  Result Date: 02/26/2018 CLINICAL DATA:  Lapse in memory. EXAM: DG ABDOMEN ACUTE W/ 1V CHEST COMPARISON:  None. FINDINGS: There is no evidence of dilated bowel loops or free intraperitoneal air. No radiopaque calculi or other significant radiographic abnormality is seen. Heart size and mediastinal  contours are within normal limits. Both lungs are clear. Calcified tortuous aorta. Degenerative RIGHT SI joint. Port-A-Cath, RIGHT chest subclavian approach, tip RIGHT atrium. IMPRESSION: Negative abdominal radiographs. No acute cardiopulmonary disease. Electronically Signed   By: Staci Righter M.D.   On: 02/26/2018 18:16    Procedures Procedures (including critical care time)  Medications Ordered in ED Medications  cefTRIAXone (ROCEPHIN) 1 g in sodium chloride 0.9 % 100 mL IVPB (1 g Intravenous New Bag/Given (Non-Interop) 02/26/18 1833)  sodium chloride 0.9 % bolus 1,000 mL (0 mLs Intravenous Stopped 02/26/18 1832)     Initial Impression / Assessment and Plan / ED Course  I have reviewed the triage vital signs and the nursing notes.  Pertinent labs & imaging results that were available during my care of the patient were reviewed by me and considered in my medical decision making (see chart for details).    JINELLE BUTCHKO is a 78 y.o. female here with headaches, fall, head injury, LOC. Consider post concussive syndrome vs subdural hemorrhage vs multiple myeloma with spread to the brain, less likely seizures. Will get labs, CT head, orthostatics.   6:48 PM UA ? UTI. WBC 3.0. Hg 7.9. Platelet 27. CT head showed no bleed. I called Dr. Frutoso Schatz, who is covering for Dr. Inda Merlin. I asked about PRBC or platelet transfusion and she states that myeloma patients have antibodies and they will only transfuse if Hg less than 7 and platelet less than 20. She will need repeat CBC this week. Will dc home with keflex, given rocephin in the ED.   Final Clinical Impressions(s) / ED Diagnoses   Final diagnoses:  None    ED Discharge Orders    None       Drenda Freeze, MD 02/26/18 1850

## 2018-02-26 NOTE — Progress Notes (Signed)
Per Dr. Julien Nordmann, treatment cancelled today due to Platelet. Count of 28. Patient to return next week for repeat labs. Patient informed, Lab results provided and she verbalized understanding.  Patient requested to be see and speak with Lucianne Lei, [facility] about a prior conversation they had. Lucianne Lei came by to speak with patient.

## 2018-02-26 NOTE — ED Notes (Signed)
Date and time results received: 02/26/18 1739   Test: Platelet  Critical Value: 27  Name of Provider Notified: Dr. Allie Bossier   Orders Received? Or Actions Taken?: Will Continue to Monitor Patient and wait for new orders.

## 2018-02-26 NOTE — ED Triage Notes (Signed)
Patient here from cancer center with complaints of lapse in memory. Reports that she has had episodes of loss of consciousness in which she does not remember, states that she wakes up on the floor. Lives alone. Unable to get chemo today due to low hemoglobin and platelets. Also reports last Wednesday she went to bed and woke up Friday with poop all over her. Concerned about headache to right frontal.

## 2018-02-27 ENCOUNTER — Inpatient Hospital Stay: Payer: Medicare Other

## 2018-03-01 LAB — URINE CULTURE: Culture: >=100000 — AB

## 2018-03-02 ENCOUNTER — Telehealth: Payer: Self-pay | Admitting: Emergency Medicine

## 2018-03-02 NOTE — Telephone Encounter (Signed)
Post ED Visit - Positive Culture Follow-up  Culture report reviewed by antimicrobial stewardship pharmacist:  []  Elenor Quinones, Pharm.D. []  Heide Guile, Pharm.D., BCPS AQ-ID []  Parks Neptune, Pharm.D., BCPS []  Alycia Rossetti, Pharm.D., BCPS []  [place], Pharm.D., BCPS, AAHIVP []  Legrand Como, Pharm.D., BCPS, AAHIVP []  Salome Arnt, PharmD, BCPS []  Johnnette Gourd, PharmD, BCPS []  Hughes Better, PharmD, BCPS []  Leeroy Cha, PharmD Sloan Leiter PharmD  Positive urine culture Treated with cephalexin, organism sensitive to the same and no further patient follow-up is required at this time.  Hazle Nordmann 03/02/2018, 12:33 PM

## 2018-03-03 ENCOUNTER — Inpatient Hospital Stay (HOSPITAL_COMMUNITY)
Admission: EM | Admit: 2018-03-03 | Discharge: 2018-03-11 | DRG: 040 | Disposition: A | Discharge status: Rehabilitation Facility | Payer: Medicare Other | Attending: Internal Medicine | Admitting: Internal Medicine

## 2018-03-03 ENCOUNTER — Emergency Department (HOSPITAL_COMMUNITY): Payer: Medicare Other

## 2018-03-03 ENCOUNTER — Other Ambulatory Visit: Payer: Self-pay

## 2018-03-03 ENCOUNTER — Inpatient Hospital Stay (HOSPITAL_COMMUNITY): Payer: Medicare Other

## 2018-03-03 ENCOUNTER — Encounter (HOSPITAL_COMMUNITY): Payer: Self-pay

## 2018-03-03 DIAGNOSIS — I6300 Cerebral infarction due to thrombosis of unspecified precerebral artery: Secondary | ICD-10-CM | POA: Diagnosis not present

## 2018-03-03 DIAGNOSIS — D61818 Other pancytopenia: Secondary | ICD-10-CM

## 2018-03-03 DIAGNOSIS — I959 Hypotension, unspecified: Secondary | ICD-10-CM

## 2018-03-03 DIAGNOSIS — R778 Other specified abnormalities of plasma proteins: Secondary | ICD-10-CM | POA: Diagnosis not present

## 2018-03-03 DIAGNOSIS — C9000 Multiple myeloma not having achieved remission: Secondary | ICD-10-CM | POA: Diagnosis not present

## 2018-03-03 DIAGNOSIS — D61810 Antineoplastic chemotherapy induced pancytopenia: Secondary | ICD-10-CM | POA: Diagnosis present

## 2018-03-03 DIAGNOSIS — I639 Cerebral infarction, unspecified: Secondary | ICD-10-CM | POA: Diagnosis not present

## 2018-03-03 DIAGNOSIS — Z66 Do not resuscitate: Secondary | ICD-10-CM | POA: Diagnosis present

## 2018-03-03 DIAGNOSIS — N184 Chronic kidney disease, stage 4 (severe): Secondary | ICD-10-CM | POA: Diagnosis present

## 2018-03-03 DIAGNOSIS — I10 Essential (primary) hypertension: Secondary | ICD-10-CM | POA: Diagnosis not present

## 2018-03-03 DIAGNOSIS — R7989 Other specified abnormal findings of blood chemistry: Secondary | ICD-10-CM | POA: Diagnosis not present

## 2018-03-03 DIAGNOSIS — R531 Weakness: Secondary | ICD-10-CM | POA: Diagnosis not present

## 2018-03-03 DIAGNOSIS — G8191 Hemiplegia, unspecified affecting right dominant side: Secondary | ICD-10-CM | POA: Diagnosis not present

## 2018-03-03 DIAGNOSIS — T451X5A Adverse effect of antineoplastic and immunosuppressive drugs, initial encounter: Secondary | ICD-10-CM | POA: Diagnosis present

## 2018-03-03 DIAGNOSIS — Z87891 Personal history of nicotine dependence: Secondary | ICD-10-CM | POA: Diagnosis not present

## 2018-03-03 DIAGNOSIS — E039 Hypothyroidism, unspecified: Secondary | ICD-10-CM | POA: Diagnosis present

## 2018-03-03 DIAGNOSIS — I82402 Acute embolism and thrombosis of unspecified deep veins of left lower extremity: Secondary | ICD-10-CM | POA: Diagnosis not present

## 2018-03-03 DIAGNOSIS — Z961 Presence of intraocular lens: Secondary | ICD-10-CM | POA: Diagnosis present

## 2018-03-03 DIAGNOSIS — I69398 Other sequelae of cerebral infarction: Secondary | ICD-10-CM | POA: Diagnosis not present

## 2018-03-03 DIAGNOSIS — F419 Anxiety disorder, unspecified: Secondary | ICD-10-CM | POA: Diagnosis present

## 2018-03-03 DIAGNOSIS — J984 Other disorders of lung: Secondary | ICD-10-CM | POA: Diagnosis not present

## 2018-03-03 DIAGNOSIS — R29710 NIHSS score 10: Secondary | ICD-10-CM | POA: Diagnosis present

## 2018-03-03 DIAGNOSIS — R4702 Dysphasia: Secondary | ICD-10-CM | POA: Diagnosis present

## 2018-03-03 DIAGNOSIS — E538 Deficiency of other specified B group vitamins: Secondary | ICD-10-CM | POA: Diagnosis present

## 2018-03-03 DIAGNOSIS — Z8261 Family history of arthritis: Secondary | ICD-10-CM

## 2018-03-03 DIAGNOSIS — Z885 Allergy status to narcotic agent status: Secondary | ICD-10-CM

## 2018-03-03 DIAGNOSIS — R51 Headache: Secondary | ICD-10-CM | POA: Diagnosis not present

## 2018-03-03 DIAGNOSIS — R2689 Other abnormalities of gait and mobility: Secondary | ICD-10-CM | POA: Diagnosis not present

## 2018-03-03 DIAGNOSIS — E785 Hyperlipidemia, unspecified: Secondary | ICD-10-CM

## 2018-03-03 DIAGNOSIS — D696 Thrombocytopenia, unspecified: Secondary | ICD-10-CM | POA: Diagnosis present

## 2018-03-03 DIAGNOSIS — R29721 NIHSS score 21: Secondary | ICD-10-CM | POA: Diagnosis not present

## 2018-03-03 DIAGNOSIS — R4701 Aphasia: Secondary | ICD-10-CM | POA: Diagnosis not present

## 2018-03-03 DIAGNOSIS — F329 Major depressive disorder, single episode, unspecified: Secondary | ICD-10-CM | POA: Diagnosis present

## 2018-03-03 DIAGNOSIS — Z8349 Family history of other endocrine, nutritional and metabolic diseases: Secondary | ICD-10-CM

## 2018-03-03 DIAGNOSIS — Z91040 Latex allergy status: Secondary | ICD-10-CM

## 2018-03-03 DIAGNOSIS — X58XXXA Exposure to other specified factors, initial encounter: Secondary | ICD-10-CM | POA: Diagnosis present

## 2018-03-03 DIAGNOSIS — C161 Malignant neoplasm of fundus of stomach: Secondary | ICD-10-CM | POA: Diagnosis not present

## 2018-03-03 DIAGNOSIS — I951 Orthostatic hypotension: Secondary | ICD-10-CM | POA: Diagnosis present

## 2018-03-03 DIAGNOSIS — R4182 Altered mental status, unspecified: Secondary | ICD-10-CM | POA: Diagnosis not present

## 2018-03-03 DIAGNOSIS — R269 Unspecified abnormalities of gait and mobility: Secondary | ICD-10-CM | POA: Diagnosis not present

## 2018-03-03 DIAGNOSIS — M797 Fibromyalgia: Secondary | ICD-10-CM | POA: Diagnosis present

## 2018-03-03 DIAGNOSIS — I110 Hypertensive heart disease with heart failure: Secondary | ICD-10-CM | POA: Diagnosis not present

## 2018-03-03 DIAGNOSIS — R933 Abnormal findings on diagnostic imaging of other parts of digestive tract: Secondary | ICD-10-CM | POA: Diagnosis not present

## 2018-03-03 DIAGNOSIS — Z9049 Acquired absence of other specified parts of digestive tract: Secondary | ICD-10-CM

## 2018-03-03 DIAGNOSIS — Z90710 Acquired absence of both cervix and uterus: Secondary | ICD-10-CM

## 2018-03-03 DIAGNOSIS — K449 Diaphragmatic hernia without obstruction or gangrene: Secondary | ICD-10-CM | POA: Diagnosis not present

## 2018-03-03 DIAGNOSIS — Z9481 Bone marrow transplant status: Secondary | ICD-10-CM | POA: Diagnosis not present

## 2018-03-03 DIAGNOSIS — E663 Overweight: Secondary | ICD-10-CM | POA: Diagnosis present

## 2018-03-03 DIAGNOSIS — Z825 Family history of asthma and other chronic lower respiratory diseases: Secondary | ICD-10-CM

## 2018-03-03 DIAGNOSIS — R682 Dry mouth, unspecified: Secondary | ICD-10-CM | POA: Diagnosis present

## 2018-03-03 DIAGNOSIS — C9001 Multiple myeloma in remission: Secondary | ICD-10-CM | POA: Diagnosis present

## 2018-03-03 DIAGNOSIS — C169 Malignant neoplasm of stomach, unspecified: Secondary | ICD-10-CM | POA: Diagnosis present

## 2018-03-03 DIAGNOSIS — I82409 Acute embolism and thrombosis of unspecified deep veins of unspecified lower extremity: Secondary | ICD-10-CM | POA: Diagnosis not present

## 2018-03-03 DIAGNOSIS — I700 Atherosclerosis of aorta: Secondary | ICD-10-CM | POA: Diagnosis present

## 2018-03-03 DIAGNOSIS — Z91018 Allergy to other foods: Secondary | ICD-10-CM

## 2018-03-03 DIAGNOSIS — I82442 Acute embolism and thrombosis of left tibial vein: Secondary | ICD-10-CM | POA: Diagnosis present

## 2018-03-03 DIAGNOSIS — R0789 Other chest pain: Secondary | ICD-10-CM | POA: Diagnosis not present

## 2018-03-03 DIAGNOSIS — N183 Chronic kidney disease, stage 3 unspecified: Secondary | ICD-10-CM

## 2018-03-03 DIAGNOSIS — Z79890 Hormone replacement therapy (postmenopausal): Secondary | ICD-10-CM

## 2018-03-03 DIAGNOSIS — Z853 Personal history of malignant neoplasm of breast: Secondary | ICD-10-CM

## 2018-03-03 DIAGNOSIS — Z882 Allergy status to sulfonamides status: Secondary | ICD-10-CM

## 2018-03-03 DIAGNOSIS — I129 Hypertensive chronic kidney disease with stage 1 through stage 4 chronic kidney disease, or unspecified chronic kidney disease: Secondary | ICD-10-CM | POA: Diagnosis present

## 2018-03-03 DIAGNOSIS — I472 Ventricular tachycardia: Secondary | ICD-10-CM | POA: Diagnosis not present

## 2018-03-03 DIAGNOSIS — R110 Nausea: Secondary | ICD-10-CM | POA: Diagnosis not present

## 2018-03-03 DIAGNOSIS — Z9484 Stem cells transplant status: Secondary | ICD-10-CM

## 2018-03-03 DIAGNOSIS — I82452 Acute embolism and thrombosis of left peroneal vein: Secondary | ICD-10-CM | POA: Diagnosis present

## 2018-03-03 DIAGNOSIS — Z79891 Long term (current) use of opiate analgesic: Secondary | ICD-10-CM

## 2018-03-03 DIAGNOSIS — K2900 Acute gastritis without bleeding: Secondary | ICD-10-CM | POA: Diagnosis present

## 2018-03-03 DIAGNOSIS — Z79899 Other long term (current) drug therapy: Secondary | ICD-10-CM

## 2018-03-03 DIAGNOSIS — I6389 Other cerebral infarction: Secondary | ICD-10-CM | POA: Diagnosis not present

## 2018-03-03 DIAGNOSIS — I214 Non-ST elevation (NSTEMI) myocardial infarction: Secondary | ICD-10-CM

## 2018-03-03 DIAGNOSIS — R1310 Dysphagia, unspecified: Secondary | ICD-10-CM | POA: Diagnosis not present

## 2018-03-03 DIAGNOSIS — K5901 Slow transit constipation: Secondary | ICD-10-CM | POA: Diagnosis not present

## 2018-03-03 DIAGNOSIS — E782 Mixed hyperlipidemia: Secondary | ICD-10-CM | POA: Diagnosis present

## 2018-03-03 DIAGNOSIS — M1990 Unspecified osteoarthritis, unspecified site: Secondary | ICD-10-CM | POA: Diagnosis present

## 2018-03-03 DIAGNOSIS — K224 Dyskinesia of esophagus: Secondary | ICD-10-CM | POA: Diagnosis present

## 2018-03-03 DIAGNOSIS — K219 Gastro-esophageal reflux disease without esophagitis: Secondary | ICD-10-CM | POA: Diagnosis present

## 2018-03-03 DIAGNOSIS — I252 Old myocardial infarction: Secondary | ICD-10-CM

## 2018-03-03 DIAGNOSIS — I6340 Cerebral infarction due to embolism of unspecified cerebral artery: Principal | ICD-10-CM | POA: Diagnosis present

## 2018-03-03 DIAGNOSIS — Z9842 Cataract extraction status, left eye: Secondary | ICD-10-CM

## 2018-03-03 DIAGNOSIS — G441 Vascular headache, not elsewhere classified: Secondary | ICD-10-CM | POA: Diagnosis not present

## 2018-03-03 DIAGNOSIS — Q211 Atrial septal defect: Secondary | ICD-10-CM

## 2018-03-03 DIAGNOSIS — Z6829 Body mass index (BMI) 29.0-29.9, adult: Secondary | ICD-10-CM

## 2018-03-03 DIAGNOSIS — D490 Neoplasm of unspecified behavior of digestive system: Secondary | ICD-10-CM | POA: Diagnosis not present

## 2018-03-03 DIAGNOSIS — R410 Disorientation, unspecified: Secondary | ICD-10-CM | POA: Diagnosis not present

## 2018-03-03 DIAGNOSIS — I361 Nonrheumatic tricuspid (valve) insufficiency: Secondary | ICD-10-CM | POA: Diagnosis not present

## 2018-03-03 DIAGNOSIS — Z91048 Other nonmedicinal substance allergy status: Secondary | ICD-10-CM

## 2018-03-03 DIAGNOSIS — I5032 Chronic diastolic (congestive) heart failure: Secondary | ICD-10-CM | POA: Diagnosis not present

## 2018-03-03 DIAGNOSIS — R112 Nausea with vomiting, unspecified: Secondary | ICD-10-CM | POA: Diagnosis not present

## 2018-03-03 DIAGNOSIS — E876 Hypokalemia: Secondary | ICD-10-CM | POA: Diagnosis not present

## 2018-03-03 DIAGNOSIS — I2699 Other pulmonary embolism without acute cor pulmonale: Secondary | ICD-10-CM | POA: Diagnosis not present

## 2018-03-03 DIAGNOSIS — I69320 Aphasia following cerebral infarction: Secondary | ICD-10-CM | POA: Diagnosis not present

## 2018-03-03 DIAGNOSIS — J438 Other emphysema: Secondary | ICD-10-CM | POA: Diagnosis not present

## 2018-03-03 DIAGNOSIS — D371 Neoplasm of uncertain behavior of stomach: Secondary | ICD-10-CM | POA: Diagnosis not present

## 2018-03-03 DIAGNOSIS — R918 Other nonspecific abnormal finding of lung field: Secondary | ICD-10-CM | POA: Diagnosis not present

## 2018-03-03 DIAGNOSIS — Z9841 Cataract extraction status, right eye: Secondary | ICD-10-CM

## 2018-03-03 DIAGNOSIS — K319 Disease of stomach and duodenum, unspecified: Secondary | ICD-10-CM | POA: Diagnosis present

## 2018-03-03 DIAGNOSIS — D630 Anemia in neoplastic disease: Secondary | ICD-10-CM | POA: Diagnosis not present

## 2018-03-03 DIAGNOSIS — D649 Anemia, unspecified: Secondary | ICD-10-CM | POA: Diagnosis not present

## 2018-03-03 DIAGNOSIS — C801 Malignant (primary) neoplasm, unspecified: Secondary | ICD-10-CM | POA: Diagnosis not present

## 2018-03-03 DIAGNOSIS — K3189 Other diseases of stomach and duodenum: Secondary | ICD-10-CM | POA: Diagnosis not present

## 2018-03-03 DIAGNOSIS — I6602 Occlusion and stenosis of left middle cerebral artery: Secondary | ICD-10-CM | POA: Diagnosis not present

## 2018-03-03 DIAGNOSIS — Q250 Patent ductus arteriosus: Secondary | ICD-10-CM | POA: Diagnosis not present

## 2018-03-03 DIAGNOSIS — Z9012 Acquired absence of left breast and nipple: Secondary | ICD-10-CM

## 2018-03-03 DIAGNOSIS — Z86711 Personal history of pulmonary embolism: Secondary | ICD-10-CM

## 2018-03-03 DIAGNOSIS — J439 Emphysema, unspecified: Secondary | ICD-10-CM | POA: Diagnosis present

## 2018-03-03 DIAGNOSIS — R29810 Facial weakness: Secondary | ICD-10-CM | POA: Diagnosis not present

## 2018-03-03 DIAGNOSIS — I63512 Cerebral infarction due to unspecified occlusion or stenosis of left middle cerebral artery: Secondary | ICD-10-CM | POA: Diagnosis not present

## 2018-03-03 LAB — COMPREHENSIVE METABOLIC PANEL
ALT: 9 U/L (ref 0–44)
ANION GAP: 10 (ref 5–15)
AST: 16 U/L (ref 15–41)
Albumin: 3.5 g/dL (ref 3.5–5.0)
Alkaline Phosphatase: 83 U/L (ref 38–126)
BUN: 20 mg/dL (ref 8–23)
CO2: 20 mmol/L — ABNORMAL LOW (ref 22–32)
CREATININE: 2.4 mg/dL — AB (ref 0.44–1.00)
Calcium: 8.3 mg/dL — ABNORMAL LOW (ref 8.9–10.3)
Chloride: 110 mmol/L (ref 98–111)
GFR calc Af Amer: 22 mL/min — ABNORMAL LOW (ref >60)
GFR calc non Af Amer: 19 mL/min — ABNORMAL LOW (ref >60)
Glucose, Bld: 105 mg/dL — ABNORMAL HIGH (ref 70–99)
Potassium: 3.5 mmol/L (ref 3.5–5.1)
Sodium: 140 mmol/L (ref 135–145)
Total Bilirubin: 1.1 mg/dL (ref 0.3–1.2)
Total Protein: 5.6 g/dL — ABNORMAL LOW (ref 6.5–8.1)

## 2018-03-03 LAB — URINALYSIS, ROUTINE W REFLEX MICROSCOPIC
Bilirubin Urine: NEGATIVE
Glucose, UA: 50 mg/dL — AB
Ketones, ur: NEGATIVE mg/dL
Leukocytes,Ua: NEGATIVE
Nitrite: NEGATIVE
Protein, ur: NEGATIVE mg/dL
Specific Gravity, Urine: 1.004 — ABNORMAL LOW (ref 1.005–1.030)
pH: 6 (ref 5.0–8.0)

## 2018-03-03 LAB — CBC WITH DIFFERENTIAL/PLATELET
Abs Immature Granulocytes: 0.03 10*3/uL (ref 0.00–0.07)
Basophils Absolute: 0 10*3/uL (ref 0.0–0.1)
Basophils Relative: 0 %
EOS ABS: 0.1 10*3/uL (ref 0.0–0.5)
Eosinophils Relative: 5 %
HCT: 26.8 % — ABNORMAL LOW (ref 36.0–46.0)
Hemoglobin: 8.3 g/dL — ABNORMAL LOW (ref 12.0–15.0)
Immature Granulocytes: 1 %
Lymphocytes Relative: 31 %
Lymphs Abs: 0.7 10*3/uL (ref 0.7–4.0)
MCH: 32.4 pg (ref 26.0–34.0)
MCHC: 31 g/dL (ref 30.0–36.0)
MCV: 104.7 fL — ABNORMAL HIGH (ref 80.0–100.0)
Monocytes Absolute: 0.4 10*3/uL (ref 0.1–1.0)
Monocytes Relative: 17 %
Neutro Abs: 1.1 10*3/uL — ABNORMAL LOW (ref 1.7–7.7)
Neutrophils Relative %: 46 %
Platelets: 54 10*3/uL — ABNORMAL LOW (ref 150–400)
RBC: 2.56 MIL/uL — ABNORMAL LOW (ref 3.87–5.11)
RDW: 24.2 % — ABNORMAL HIGH (ref 11.5–15.5)
WBC: 2.4 10*3/uL — AB (ref 4.0–10.5)
nRBC: 0 % (ref 0.0–0.2)

## 2018-03-03 LAB — LACTIC ACID, PLASMA: Lactic Acid, Venous: 1.2 mmol/L (ref 0.5–1.9)

## 2018-03-03 LAB — SEDIMENTATION RATE: Sed Rate: 30 mm/hr — ABNORMAL HIGH (ref 0–22)

## 2018-03-03 LAB — C-REACTIVE PROTEIN: CRP: 6 mg/dL — ABNORMAL HIGH (ref <1.0)

## 2018-03-03 LAB — AMMONIA: Ammonia: 10 umol/L (ref 9–35)

## 2018-03-03 LAB — BLOOD GAS, VENOUS: PATIENT TEMPERATURE: 98.6

## 2018-03-03 LAB — PROTIME-INR
INR: 0.9
Prothrombin Time: 12.1 seconds (ref 11.4–15.2)

## 2018-03-03 LAB — ETHANOL: Alcohol, Ethyl (B): <10 mg/dL (ref <10)

## 2018-03-03 MED ORDER — SODIUM CHLORIDE 0.9% FLUSH: 3.0000 mL | Freq: Once | INTRAVENOUS | Status: DC

## 2018-03-03 MED ORDER — CITALOPRAM HYDROBROMIDE 40 MG PO TABS
40.0000 mg | ORAL_TABLET | Freq: Every day | ORAL | Status: DC
  Administered 2018-03-04 – 2018-03-11 (×8): 40 mg via ORAL
  Filled 2018-03-03 (×8): qty 1

## 2018-03-03 MED ORDER — VITAMIN B-12 1000 MCG PO TABS
1000.0000 ug | ORAL_TABLET | Freq: Every day | ORAL | Status: DC
  Administered 2018-03-04 – 2018-03-11 (×8): 1000 ug via ORAL
  Filled 2018-03-03 (×8): qty 1

## 2018-03-03 MED ORDER — GABAPENTIN 300 MG PO CAPS
600.0000 mg | ORAL_CAPSULE | Freq: Every day | ORAL | Status: DC
  Administered 2018-03-03 – 2018-03-10 (×8): 600 mg via ORAL
  Filled 2018-03-03 (×8): qty 2

## 2018-03-03 MED ORDER — BUPROPION HCL 75 MG PO TABS
75.0000 mg | ORAL_TABLET | Freq: Two times a day (BID) | ORAL | Status: DC
  Administered 2018-03-03 – 2018-03-11 (×16): 75 mg via ORAL
  Filled 2018-03-03 (×16): qty 1

## 2018-03-03 MED ORDER — TRAMADOL HCL 50 MG PO TABS
50.0000 mg | ORAL_TABLET | Freq: Four times a day (QID) | ORAL | Status: DC | PRN
  Administered 2018-03-04 – 2018-03-10 (×5): 50 mg via ORAL
  Filled 2018-03-03 (×5): qty 1

## 2018-03-03 MED ORDER — GABAPENTIN 300 MG PO CAPS
300.0000 mg | ORAL_CAPSULE | Freq: Every day | ORAL | Status: DC | PRN
  Filled 2018-03-03: qty 1

## 2018-03-03 MED ORDER — MONTELUKAST SODIUM 10 MG PO TABS
10.0000 mg | ORAL_TABLET | Freq: Every day | ORAL | Status: DC
  Administered 2018-03-03 – 2018-03-10 (×8): 10 mg via ORAL
  Filled 2018-03-03 (×8): qty 1

## 2018-03-03 MED ORDER — ASPIRIN 300 MG RE SUPP: 300.0000 mg | Freq: Every day | RECTAL | Status: DC

## 2018-03-03 MED ORDER — CLOTRIMAZOLE 2 % VA CREA
1.0000 | TOPICAL_CREAM | Freq: Every day | VAGINAL | Status: AC
  Administered 2018-03-04 – 2018-03-05 (×2): 1 via VAGINAL
  Filled 2018-03-03: qty 21

## 2018-03-03 MED ORDER — LORAZEPAM 2 MG/ML IJ SOLN
1.0000 mg | Freq: Once | INTRAMUSCULAR | Status: AC
  Administered 2018-03-03: 1 mg via INTRAVENOUS
  Filled 2018-03-03: qty 1

## 2018-03-03 MED ORDER — STROKE: EARLY STAGES OF RECOVERY BOOK
Freq: Once | Status: DC
  Filled 2018-03-03 (×2): qty 1

## 2018-03-03 MED ORDER — ASPIRIN 325 MG PO TABS
325.0000 mg | ORAL_TABLET | Freq: Every day | ORAL | Status: DC
  Administered 2018-03-03: 325 mg via ORAL
  Filled 2018-03-03: qty 1

## 2018-03-03 MED ORDER — ACETAMINOPHEN 325 MG PO TABS
650.0000 mg | ORAL_TABLET | Freq: Four times a day (QID) | ORAL | Status: DC | PRN
  Administered 2018-03-05 – 2018-03-09 (×4): 650 mg via ORAL
  Filled 2018-03-03 (×6): qty 2

## 2018-03-03 MED ORDER — FAMOTIDINE 20 MG PO TABS
20.0000 mg | ORAL_TABLET | Freq: Every day | ORAL | Status: DC
  Administered 2018-03-04 – 2018-03-10 (×7): 20 mg via ORAL
  Filled 2018-03-03 (×7): qty 1

## 2018-03-03 MED ORDER — LEVOTHYROXINE SODIUM 75 MCG PO TABS
150.0000 ug | ORAL_TABLET | Freq: Every day | ORAL | Status: DC
  Administered 2018-03-04 – 2018-03-11 (×7): 150 ug via ORAL
  Filled 2018-03-03 (×7): qty 2

## 2018-03-03 MED ORDER — DIPHENHYDRAMINE HCL 50 MG/ML IJ SOLN
12.5000 mg | Freq: Once | INTRAMUSCULAR | Status: AC
  Administered 2018-03-03: 12.5 mg via INTRAVENOUS
  Filled 2018-03-03: qty 1

## 2018-03-03 MED ORDER — SODIUM CHLORIDE 0.9 % IV BOLUS
1000.0000 mL | Freq: Once | INTRAVENOUS | Status: AC
  Administered 2018-03-03: 1000 mL via INTRAVENOUS

## 2018-03-03 MED ORDER — SUCRALFATE 1 G PO TABS
1.0000 g | ORAL_TABLET | Freq: Three times a day (TID) | ORAL | Status: DC
  Administered 2018-03-03 – 2018-03-08 (×18): 1 g via ORAL
  Filled 2018-03-03 (×18): qty 1

## 2018-03-03 MED ORDER — LACTATED RINGERS IV SOLN
INTRAVENOUS | Status: AC
  Administered 2018-03-03: 23:00:00 via INTRAVENOUS

## 2018-03-03 MED ORDER — ACETAMINOPHEN 650 MG RE SUPP: 650.0000 mg | Freq: Four times a day (QID) | RECTAL | Status: DC | PRN

## 2018-03-03 MED ORDER — PILOCARPINE HCL 5 MG PO TABS
5.0000 mg | ORAL_TABLET | Freq: Two times a day (BID) | ORAL | Status: DC
  Administered 2018-03-03 – 2018-03-11 (×16): 5 mg via ORAL
  Filled 2018-03-03 (×16): qty 1

## 2018-03-03 MED ORDER — DENOSUMAB 120 MG/1.7ML ~~LOC~~ SOLN
SUBCUTANEOUS | Status: AC
  Filled 2018-03-03: qty 1.7

## 2018-03-03 MED ORDER — PROCHLORPERAZINE EDISYLATE 10 MG/2ML IJ SOLN
5.0000 mg | Freq: Once | INTRAMUSCULAR | Status: AC
  Administered 2018-03-03: 5 mg via INTRAVENOUS
  Filled 2018-03-03: qty 2

## 2018-03-03 MED ORDER — CROMOLYN SODIUM 4 % OP SOLN
1.0000 [drp] | Freq: Four times a day (QID) | OPHTHALMIC | Status: DC | PRN
  Filled 2018-03-03: qty 10

## 2018-03-03 MED ORDER — LORATADINE 10 MG PO TABS
10.0000 mg | ORAL_TABLET | Freq: Every day | ORAL | Status: DC
  Administered 2018-03-04 – 2018-03-11 (×8): 10 mg via ORAL
  Filled 2018-03-03 (×8): qty 1

## 2018-03-03 MED ORDER — MIDODRINE HCL 5 MG PO TABS
10.0000 mg | ORAL_TABLET | Freq: Every day | ORAL | Status: DC
  Administered 2018-03-04 – 2018-03-11 (×8): 10 mg via ORAL
  Filled 2018-03-03 (×8): qty 2

## 2018-03-03 MED ORDER — ALBUTEROL SULFATE (2.5 MG/3ML) 0.083% IN NEBU: 3.0000 mL | INHALATION_SOLUTION | Freq: Four times a day (QID) | RESPIRATORY_TRACT | Status: DC | PRN

## 2018-03-03 MED ORDER — DEXAMETHASONE 4 MG PO TABS: 4.0000 mg | ORAL_TABLET | Freq: Every day | ORAL | Status: DC

## 2018-03-03 MED ORDER — FOLIC ACID 1 MG PO TABS
1.0000 mg | ORAL_TABLET | Freq: Every day | ORAL | Status: DC
  Administered 2018-03-04 – 2018-03-11 (×8): 1 mg via ORAL
  Filled 2018-03-03 (×8): qty 1

## 2018-03-03 MED ORDER — LEVOCETIRIZINE DIHYDROCHLORIDE 5 MG PO TABS: 5.0000 mg | ORAL_TABLET | Freq: Every evening | ORAL | Status: DC

## 2018-03-03 NOTE — ED Notes (Signed)
[facility] picking up patient.

## 2018-03-03 NOTE — ED Notes (Signed)
[facility] called for patient transport to [facility] 3W. Report called to [facility] 3W.

## 2018-03-03 NOTE — Progress Notes (Signed)
Pt and daughter requested for something prior to MRI; pt also informs RN she has yeast infection; NP on-call notified and new orders received. Pt transported off unit to MRI. Delia Heady RN

## 2018-03-03 NOTE — ED Notes (Addendum)
ED Provider at bedside. 

## 2018-03-03 NOTE — ED Triage Notes (Signed)
Patient BIB EMS from home with complaints of "confusion". Patient is multiple myeloma patient and is being treated by [facility] cancer center. Pt sent a text to her daughter this morning stating "help." Patient AxOx4 for EMS, but patient states to EMS she "feels confused. Everything is foggy." Patient complaining of headache pain at this time, with associated nausea. Patient has history of migraines. Patient was seen at [facility] 5 days ago for confusion and was given Rx for UTI. Patient reports taking her Rx Antibiotics appropriately. Negative stroke screen for EMS.  22G PIV placed to Right hand by EMS.

## 2018-03-03 NOTE — ED Notes (Signed)
ED TO INPATIENT HANDOFF REPORT  Name/Age/Gender Nancy [place] 78 y.o. female  Code Status    Code Status Orders  (From admission, onward)         Start     Ordered   03/03/18 1718  Do not attempt resuscitation (DNR)  Continuous    Question Answer Comment  In the event of cardiac or respiratory ARREST Do not call a "code blue"   In the event of cardiac or respiratory ARREST Do not perform Intubation, CPR, defibrillation or ACLS   In the event of cardiac or respiratory ARREST Use medication by any route, position, wound care, and other measures to relive pain and suffering. May use oxygen, suction and manual treatment of airway obstruction as needed for comfort.      03/03/18 1717        Code Status History    Date Active Date Inactive Code Status Order ID Comments User Context   03/16/2017 0206 03/19/2017 2012 DNR [id number]  Merton Border, MD Inpatient   10/21/2015 1220 10/21/2015 1450 Full Code [id number]  Monia Sabal, PA-C HOV   12/03/2014 0212 12/07/2014 1711 Full Code [id number]  Allyne Gee, MD Inpatient   04/02/2014 2128 04/04/2014 1941 Full Code [id number]  Lavina Hamman, MD Inpatient   12/02/2013 0853 12/02/2013 2045 Full Code [id number]  Jonetta Osgood, MD Inpatient   01/13/2013 2051 01/15/2013 1348 DNR [id number]  Annita Brod, MD Inpatient   12/03/2012 1152 12/06/2012 1547 DNR [id number]  Bonnielee Haff, MD Inpatient   12/02/2012 1338 12/03/2012 1152 Full Code [id number]  Theodis Blaze, MD Inpatient    Advance Directive Documentation     Most Recent Value  Type of Advance Directive  Healthcare Power of Attorney, Living will  Pre-existing out of facility DNR order (yellow form or pink MOST form)  -  "MOST" Form in Place?  -      Home/SNF/Other Home  Chief Complaint altered  Level of Care/Admitting Diagnosis ED Disposition    ED Disposition Condition [place]: [place] [[id number]]  Level of Care: Medical Telemetry  [104]  Diagnosis: Stroke [facility]) [[id number]]  Admitting Physician: Elodia Florence [phone]  Attending Physician: Cephus Slater, A CALDWELL [phone]  Estimated length of stay: past midnight tomorrow  Certification:: I certify this patient will need inpatient services for at least 2 midnights  PT Class (Do Not Modify): Inpatient [101]  PT Acc Code (Do Not Modify): Private [1]       Medical History Past Medical History:  Diagnosis Date  . Anemia   . Anginal pain ([place])    used NTG x 2 May 31 and 06/15/13   . Anxiety   . Arthritis   . B12 deficiency 12/04/2014  . Breast cancer ([place])   . Complication of anesthesia   . COPD (chronic obstructive pulmonary disease) ([place])   . 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 Depression   . Dizziness   . Dyspnea   . Fibromyalgia   . Fibromyalgia   . GERD (gastroesophageal reflux disease)   . Headache(784.0)   . Heart murmur   . Hemoptysis 10/21/2015  . History of blood transfusion    last one May 12   . Hx of cardiovascular stress test    LexiScan with low level exercise Myoview (02/2013): No ischemia, EF 72%; normal study  . Hx of echocardiogram    a.  Echocardiogram (12/26/2012): EF 79-39%, grade 1 diastolic dysfunction;  b.  Echocardiogram (02/2013): EF 55-60%, no WMA, trivial effusion  . Hyperkalemia   . Hyponatremia   . Hypotension   . Hypothyroidism   . Mucositis   . Multiple myeloma   . Myocardial infarction [facility])    in past, patient was unaware.   . Neuropathy   . Nodule of left lung 09/13/2015  . Pain in joint, pelvic region and thigh 07/07/2015  . Pneumonia    several  . PONV (postoperative nausea and vomiting) 2008   after mastestomy    Allergies Allergies  Allergen Reactions  . Codeine Anaphylaxis    anaphylasix tolarates oxycodone per care cast  . Latex Shortness Of Breath  . Chocolate     migraines  . Onion Other (See Comments)    Causes migraine headaches  . Zyprexa [Olanzapine] Other (See Comments)     Confusion , dizzy,unsteady  . Adhesive [Tape] Other (See Comments)    blisters  . Heparin Rash    Can flush port with heparin  . Hydrocodone Rash    With extreme itching  . Iodinated Diagnostic Agents Hives, Itching, Rash and Other (See Comments)    Happened 60 years ago Stage IV kidney function  . Oxycodone Rash  . Promethazine-Dm Hives  . Sulfa Antibiotics Itching    IV Location/Drains/Wounds Patient Lines/Drains/Airways Status   Active Line/Drains/Airways    Name:   Placement date:   Placement time:   Site:   Days:   Implanted Port 01/01/13 Right Chest   01/01/13    1427    Chest   1887   Implanted Port Right Chest   -    -    Chest      Peripheral IV 03/03/18 Left Hand   03/03/18    -    Hand   less than 1   Fistula / Graft Left Forearm Arteriovenous fistula   06/19/13    0826    Forearm   1718   Fistula / Graft Left Upper arm Arteriovenous fistula   06/04/17    1100    Upper arm   272   Incision (Closed) 10/21/15 Chest Left;Lateral   10/21/15    1305     864   Incision (Closed) 06/04/17 Arm Left   06/04/17    1102     272          Labs/Imaging Results for orders placed or performed during the hospital encounter of 03/03/18 (from the past 48 hour(s))  Lactic acid, plasma     Status: None   Collection Time: 03/03/18 12:34 PM  Result Value Ref Range   Lactic Acid, Venous 1.2 0.5 - 1.9 mmol/L    Comment: Performed at [facility], [place] [address] [place], [place] [address]  Comprehensive metabolic panel     Status: Abnormal   Collection Time: 03/03/18 12:43 PM  Result Value Ref Range   Sodium 140 135 - 145 mmol/L   Potassium 3.5 3.5 - 5.1 mmol/L   Chloride 110 98 - 111 mmol/L   CO2 20 (L) 22 - 32 mmol/L   Glucose, Bld 105 (H) 70 - 99 mg/dL   BUN 20 8 - 23 mg/dL   Creatinine, Ser 2.40 (H) 0.44 - 1.00 mg/dL   Calcium 8.3 (L) 8.9 - 10.3 mg/dL   Total Protein 5.6 (L) 6.5 - 8.1 g/dL   Albumin 3.5 3.5 - 5.0 g/dL   AST 16 15 - 41 U/L   ALT 9 0 - 44  U/L    Alkaline Phosphatase 83 38 - 126 U/L   Total Bilirubin 1.1 0.3 - 1.2 mg/dL   GFR calc non Af Amer 19 (L) >60 mL/min   GFR calc Af Amer 22 (L) >60 mL/min   Anion gap 10 5 - 15    Comment: Performed at [facility], [place] [address], [place], [place] [address]  CBC with Differential     Status: Abnormal   Collection Time: 03/03/18 12:43 PM  Result Value Ref Range   WBC 2.4 (L) 4.0 - 10.5 K/uL   RBC 2.56 (L) 3.87 - 5.11 MIL/uL   Hemoglobin 8.3 (L) 12.0 - 15.0 g/dL   HCT 26.8 (L) 36.0 - 46.0 %   MCV 104.7 (H) 80.0 - 100.0 fL   MCH 32.4 26.0 - 34.0 pg   MCHC 31.0 30.0 - 36.0 g/dL   RDW 24.2 (H) 11.5 - 15.5 %   Platelets 54 (L) 150 - 400 K/uL    Comment: REPEATED TO VERIFY PLATELET COUNT CONFIRMED BY SMEAR SPECIMEN CHECKED FOR CLOTS Immature Platelet Fraction may be clinically indicated, consider ordering this additional test [id number]    nRBC 0.0 0.0 - 0.2 %   Neutrophils Relative % 46 %   Neutro Abs 1.1 (L) 1.7 - 7.7 K/uL   Lymphocytes Relative 31 %   Lymphs Abs 0.7 0.7 - 4.0 K/uL   Monocytes Relative 17 %   Monocytes Absolute 0.4 0.1 - 1.0 K/uL   Eosinophils Relative 5 %   Eosinophils Absolute 0.1 0.0 - 0.5 K/uL   Basophils Relative 0 %   Basophils Absolute 0.0 0.0 - 0.1 K/uL   Immature Granulocytes 1 %   Abs Immature Granulocytes 0.03 0.00 - 0.07 K/uL   Polychromasia PRESENT     Comment: Performed at [facility], [place] [address] [place], [place] [address]  Protime-INR     Status: None   Collection Time: 03/03/18 12:43 PM  Result Value Ref Range   Prothrombin Time 12.1 11.4 - 15.2 seconds   INR 0.90     Comment: Performed at [facility], [place] [address], [place], [place] [address]  Ethanol     Status: None   Collection Time: 03/03/18 12:44 PM  Result Value Ref Range   Alcohol, Ethyl (B) <10 <10 mg/dL    Comment: (NOTE) Lowest detectable limit for serum alcohol is 10 mg/dL. For medical purposes only. Performed  at [facility], [place] [address] [place], [place] [address]   Sedimentation rate     Status: Abnormal   Collection Time: 03/03/18 12:45 PM  Result Value Ref Range   Sed Rate 30 (H) 0 - 22 mm/hr    Comment: Performed at [facility], [place] [address] [place], [place] [address]  C-reactive protein     Status: Abnormal   Collection Time: 03/03/18 12:45 PM  Result Value Ref Range   CRP 6.0 (H) <1.0 mg/dL    Comment: Performed at [facility], [place] [address], [place], [place] [address]  Blood gas, venous (at [facility] and [facility])     Status: Abnormal (Preliminary result)   Collection Time: 03/03/18  1:37 PM  Result Value Ref Range   pH, Ven 7.342 7.250 - 7.430   pCO2, Ven 41.7 (L) 44.0 - 60.0 mmHg   pO2, Ven PENDING 32.0 - 45.0 mmHg   Bicarbonate 22.0 20.0 - 28.0 mmol/L   Acid-base deficit 2.9 (H) 0.0 - 2.0 mmol/L  O2 Saturation 26.1 %   Patient temperature 98.6     Comment: Performed at [facility] [address], [place], [place] [address]  Urinalysis, Routine w reflex microscopic     Status: Abnormal   Collection Time: 03/03/18  1:40 PM  Result Value Ref Range   Color, Urine STRAW (A) YELLOW   APPearance CLEAR CLEAR   Specific Gravity, Urine 1.004 (L) 1.005 - 1.030   pH 6.0 5.0 - 8.0   Glucose, UA 50 (A) NEGATIVE mg/dL   Hgb urine dipstick SMALL (A) NEGATIVE   Bilirubin Urine NEGATIVE NEGATIVE   Ketones, ur NEGATIVE NEGATIVE mg/dL   Protein, ur NEGATIVE NEGATIVE mg/dL   Nitrite NEGATIVE NEGATIVE   Leukocytes,Ua NEGATIVE NEGATIVE   RBC / HPF 0-5 0 - 5 RBC/hpf   WBC, UA 0-5 0 - 5 WBC/hpf   Bacteria, UA RARE (A) NONE SEEN   Squamous Epithelial / LPF 6-10 0 - 5    Comment: Performed at [facility], [place] [address], [place], [place] [address]  Culture, blood (Routine x 2)     Status: None (Preliminary result)   Collection Time: 03/03/18  1:41 PM  Result Value Ref Range   Specimen Description       BLOOD RIGHT ANTECUBITAL Performed at [facility], [place] [address], [place], [place] [address]    Special Requests      BOTTLES DRAWN AEROBIC AND ANAEROBIC Blood Culture adequate volume Performed at [facility] [address], [place], [place] [address]    Culture PENDING    Report Status PENDING   Ammonia     Status: None   Collection Time: 03/03/18  1:41 PM  Result Value Ref Range   Ammonia 10 9 - 35 umol/L    Comment: Performed at [facility], [place] [address], [place],  [address]   *Note: Due to a large number of results and/or encounters for the requested time period, some results have not been displayed. A complete set of results can be found in Results Review.   Dg Chest 2 View  Result Date: 03/03/2018 CLINICAL DATA:  Weakness, dizziness and confusion over the last 2 weeks. History of renal cell cancer and myeloma. EXAM: CHEST - 2 VIEW COMPARISON:  02/26/2018 radiography.  CT 01/20/2018. FINDINGS: Heart size is normal. Chronic aortic atherosclerosis. Power port inserted from a right jugular approach has its tip at the SVC RA junction. The lungs are clear except for mild scarring. No sign of active infiltrate, mass, effusion or collapse. IMPRESSION: No active cardiopulmonary disease. Electronically Signed   By: Nelson Chimes M.D.   On: 03/03/2018 12:58   Ct Head Wo Contrast  Result Date: 03/03/2018 CLINICAL DATA:  Confused. Altered level of consciousness. Breast cancer. EXAM: CT HEAD WITHOUT CONTRAST TECHNIQUE: Contiguous axial images were obtained from the base of the skull through the vertex without intravenous contrast. COMPARISON:  02/26/2018. FINDINGS: Brain: Two separate areas of ill-defined abnormal cortical and subcortical edema in the LEFT hemisphere, affecting the LEFT parietal and LEFT occipital lobes, could represent multifocal infarcts versus rapid progression of metastatic disease. These areas were previously  unremarkable on CT 5 days earlier. MRI of the brain without and with contrast recommended for further evaluation. Elsewhere, mild to moderate atrophy. Hypoattenuation of white matter, consistent with small vessel disease/post treatment effect. Vascular: Calcification of the cavernous internal carotid arteries consistent with cerebrovascular atherosclerotic disease. No signs of intracranial large vessel occlusion. Skull: Calvarium  intact. Sinuses/Orbits: No sinus disease. Negative orbits. Other: None. IMPRESSION: Two separate areas of abnormal cortical and subcortical edema in the LEFT hemisphere, affecting the LEFT parietal and LEFT occipital lobes, could represent multifocal infarcts versus rapid progression of metastatic disease. These were not present on the previous CT from 02/26/2018. MRI of the brain without and with contrast recommended for further evaluation. Electronically Signed   By: Staci Righter M.D.   On: 03/03/2018 13:26   Mr Brain Wo Contrast  Result Date: 03/03/2018 CLINICAL DATA:  Sudden onset confusion. EXAM: MRI HEAD WITHOUT CONTRAST TECHNIQUE: Multiplanar, multiecho pulse sequences of the brain and surrounding structures were obtained without intravenous contrast. COMPARISON:  Head CT 03/03/2018 FINDINGS: The examination had to be discontinued prior to completion due to altered mental status and inability to follow the technologist's instructions. Coronal and axial diffusion-weighted imaging, sagittal T1-weighted imaging and axial T2-weighted imaging were obtained. There is multifocal abnormal diffusion restriction within both hemispheres, including the left frontal lobe, both parietal lobes, left occipital lobe and both cerebellar hemispheres. The largest area of diffusion abnormalities in the posterior left parietal lobe. There is no midline shift or other mass effect. The midline structures are normal. There is moderate white matter hyperintense T2-weighted signal consistent with chronic  microvascular ischemia. IMPRESSION: 1. Examination discontinued early due to patient altered mental status. 2. Multifocal bilateral acute ischemia within multiple vascular territories in both hemispheres. This may indicate a central cardiac or aortic embolic source. Electronically Signed   By: Ulyses Jarred M.D.   On: 03/03/2018 15:04   None  Pending Labs Unresulted Labs (From admission, onward)    Start     Ordered   03/04/18 0500  Hemoglobin A1c  Tomorrow morning,   R     03/03/18 1605   03/04/18 0500  Lipid panel  Tomorrow morning,   R    Comments:  Fasting    03/03/18 1605   03/03/18 1120  Culture, blood (Routine x 2)  BLOOD CULTURE X 2,   STAT     03/03/18 1119   Signed and Held  Comprehensive metabolic panel  Tomorrow morning,   R     Signed and Held   Signed and Held  CBC  Tomorrow morning,   R     Signed and Held          Vitals/Pain Today's Vitals   03/03/18 1359 03/03/18 1400 03/03/18 1701 03/03/18 1702  BP: (!) 144/75 134/80 137/76   Pulse: 71 71  71  Resp: 15     Temp:      TempSrc:      SpO2: 98% 100%  100%    Isolation Precautions No active isolations  Medications Medications  sodium chloride flush (NS) 0.9 % injection 3 mL (3 mLs Intravenous Not Given 03/03/18 1235)   stroke: mapping our early stages of recovery book (has no administration in time range)  sodium chloride 0.9 % bolus 1,000 mL (0 mLs Intravenous Stopped 03/03/18 1342)  prochlorperazine (COMPAZINE) injection 5 mg (5 mg Intravenous Given 03/03/18 1329)  diphenhydrAMINE (BENADRYL) injection 12.5 mg (12.5 mg Intravenous Given 03/03/18 1329)    Mobility walks with person assist

## 2018-03-03 NOTE — Progress Notes (Signed)
Pt admitted to the unit from [facility] ED via [facility]. Pt A&O x3; pt oriented to the unit and room; fall/safety precaution and prevention education completed with pt and daughter at bedside; both voices understanding and denies any questions. Right chest port cath accessed and saline locked;Telemetry applied and verified with CCMD: NT called to second verify; skin intact with no opened wounds or pressure ulcer noted except for ecchymosis to bilateral arms and hands; pt in bed with call light within reach, bed alarm on and daughter at bedside. Will closely monitor. Delia Heady RN   03/03/18 2012  Vitals  Temp 98.4 F (36.9 C)  Temp Source Oral  BP 129/63  MAP (mmHg) 81  BP Location Left Arm  BP Method Automatic  Patient Position (if appropriate) Lying  Pulse Rate 81  Resp 18  Oxygen Therapy  SpO2 98 %  O2 Device Room Air  MEWS Score  MEWS RR 0  MEWS Pulse 0  MEWS Systolic 0  MEWS LOC 0  MEWS Temp 0  MEWS Score 0  MEWS Score Color Green

## 2018-03-03 NOTE — Consult Note (Signed)
Stroke Neurology Consultation Note  Consult Requested by: Dr. Florene Glen  Reason for Consult: stroke  Consult Date: 03/03/18  The history was obtained from the pt and daughter.  During history and examination, all items were able to obtain unless otherwise noted.  History of Present Illness:  Nancy Norris is a 78 y.o. Caucasian female with PMH of remote breast cancer, COPD, CKD stage IV, MI/CAD, multiple myeloma in remission admitted for amnesia, confusion, and headache.  Patient stated that 2 weeks ago, she had episode of amnesia for 36 hours.  She cannot remember what she had done for the last 36 hours although she was functioning at that time.  Last week, she had confusion after chemotherapy for CKD stage IV, presented to right alone diagnosed with UTI and was treated with antibiotics.  For the last 2 days she had a terrible headache, for which she was sent to [facility] along for further evaluation.  CT showed left occipital and left frontal parietal infarcts.  MRI confirmed multiple posterior and anterior bilateral infarcts.  She was admitted for further evaluation.  LSN: 2 weeks ago tPA Given: No: Outside window  Past Medical History:  Diagnosis Date  . Anemia   . Anginal pain ([place])    used NTG x 2 May 31 and 06/15/13   . Anxiety   . Arthritis   . B12 deficiency 12/04/2014  . Breast cancer ([place])   . Complication of anesthesia   . COPD (chronic obstructive pulmonary disease) ([place])   . Cryptococcal pneumonitis ([place]) 11/22/2015  . Depression   . Dizziness   . Dyspnea   . Fibromyalgia   . Fibromyalgia   . GERD (gastroesophageal reflux disease)   . Headache(784.0)   . Heart murmur   . Hemoptysis 10/21/2015  . History of blood transfusion    last one May 12   . Hx of cardiovascular stress test    LexiScan with low level exercise Myoview (02/2013): No ischemia, EF 72%; normal study  . Hx of echocardiogram    a.  Echocardiogram (12/26/2012): EF 60-45%, grade 1 diastolic dysfunction;   b.   Echocardiogram (02/2013): EF 55-60%, no WMA, trivial effusion  . Hyperkalemia   . Hyponatremia   . Hypotension   . Hypothyroidism   . Mucositis   . Multiple myeloma   . Myocardial infarction [facility])    in past, patient was unaware.   . Neuropathy   . Nodule of left lung 09/13/2015  . Pain in joint, pelvic region and thigh 07/07/2015  . Pneumonia    several  . PONV (postoperative nausea and vomiting) 2008   after mastestomy    Past Surgical History:  Procedure Laterality Date  . ABDOMINAL HYSTERECTOMY  1981  . AV FISTULA PLACEMENT Left 06/19/2013   Procedure: CREATION OF LEFT ARM ARTERIOVENOUS (AV) FISTULA ;  Surgeon: Angelia Mould, MD;  Location: [place] [facility];  Service: Vascular;  Laterality: Left;  . BREAST RECONSTRUCTION    . BREAST SURGERY Right    reduction  . CATARACT EXTRACTION, BILATERAL    . CHOLECYSTECTOMY  1971  . COLONOSCOPY    . EYE SURGERY Bilateral    lens implant  . history of Port removal    . LUNG BIOPSY  10/21/2015  . MASTECTOMY Left 2008  . PORTACATH PLACEMENT  12/2012   has had 2  . RESECTION OF ARTERIOVENOUS FISTULA ANEURYSM Left 06/04/2017   Procedure: LIGATION ANEURYSM OF LEFT ARTERIOVENOUS FISTULA;  Surgeon: Angelia Mould, MD;  Location: [facility]  OR;  Service: Vascular;  Laterality: Left;  . Status post stem cell transplant on September 28, 2008.      Family History  Problem Relation Age of Onset  . Arthritis Mother   . Asthma Mother   . Cancer Sister   . Hyperlipidemia Brother     Social History:  reports that she quit smoking about 13 years ago. Her smoking use included cigarettes. She has a 30.00 pack-year smoking history. She has never used smokeless tobacco. She reports that she does not drink alcohol or use drugs.  Allergies:  Allergies  Allergen Reactions  . Codeine Anaphylaxis    anaphylasix tolarates oxycodone per care cast  . Latex Shortness Of Breath  . Chocolate     migraines  . Onion Other (See Comments)    Causes migraine  headaches  . Zyprexa [Olanzapine] Other (See Comments)    Confusion , dizzy,unsteady  . Adhesive [Tape] Other (See Comments)    blisters  . Heparin Rash    Can flush port with heparin  . Hydrocodone Rash    With extreme itching  . Iodinated Diagnostic Agents Hives, Itching, Rash and Other (See Comments)    Happened 60 years ago Stage IV kidney function  . Oxycodone Rash  . Promethazine-Dm Hives  . Sulfa Antibiotics Itching    Current Facility-Administered Medications on File Prior to Encounter  Medication Dose Route Frequency Provider Last Rate Last Dose  . ipratropium-albuterol (DUONEB) 0.5-2.5 (3) MG/3ML nebulizer solution 3 mL  3 mL Nebulization Once Liane Comber, NP       Current Outpatient Medications on File Prior to Encounter  Medication Sig Dispense Refill  . acetaminophen (TYLENOL) 500 MG tablet Take 1,000 mg by mouth daily as needed for moderate pain or headache.    . albuterol (PROVENTIL HFA;VENTOLIN HFA) 108 (90 Base) MCG/ACT inhaler Inhale 1-2 puffs into the lungs every 6 (six) hours as needed for wheezing or shortness of breath (cough). 1 Inhaler 3  . azelastine (ASTELIN) 0.1 % nasal spray Place 1 spray into both nostrils daily. Use in each nostril as directed 30 mL 2  . buPROPion (WELLBUTRIN) 75 MG tablet Take 1 tablet (75 mg total) by mouth 2 (two) times daily. 60 tablet 2  . cephALEXin (KEFLEX) 500 MG capsule Take 1 capsule (500 mg total) by mouth 3 (three) times daily. 21 capsule 0  . citalopram (CELEXA) 40 MG tablet Take 1 tablet daily for Mood 90 tablet 1  . cromolyn (OPTICROM) 4 % ophthalmic solution INSTILL 1 TO 2 DROPS IN AFFECTED EYE(S) 4 TIMES A DAY FOR ALLERGIES. (Patient taking differently: Place 1-2 drops into both eyes 4 (four) times daily as needed (allergies). ) 10 mL PRN  . dexamethasone (DECADRON) 4 MG tablet Take 4 mg by mouth. 5 tabs on day 1 of chemo  0  . folic acid (FOLVITE) 1 MG tablet Take 1 tablet (1 mg total) by mouth daily. 30 tablet 3  .  furosemide (LASIX) 40 MG tablet TAKE 1 TABLET BY MOUTH DAILY. (Patient taking differently: Take 40 mg by mouth daily as needed for fluid or edema. ) 90 tablet 1  . gabapentin (NEURONTIN) 300 MG capsule TAKE 1 CAPSULE BY MOUTH 3 TIMES DAILY. (Patient taking differently: Take 300-600 mg by mouth See admin instructions. 600 mg at bedtime and an additional 300 mg during the day if "cramping") 90 capsule 1  . ipratropium-albuterol (DUONEB) 0.5-2.5 (3) MG/3ML SOLN Take 3 mLs by nebulization every 4 (four) hours as needed.  Max:6 doses per day 540 mL 0  . levocetirizine (XYZAL) 5 MG tablet Take 1 tablet (5 mg total) by mouth every evening. 90 tablet 3  . levothyroxine (SYNTHROID, LEVOTHROID) 150 MCG tablet TAKE 1 TABLET BY MOUTH DAILY. NEED OFFICE VISIT FOR FURTHER REFILLS. (Patient taking differently: Take by mouth daily before breakfast. TAKE 1 TABLET BY MOUTH DAILY. NEED OFFICE VISIT FOR FURTHER REFILLS.) 90 tablet 0  . loperamide (IMODIUM) 2 MG capsule Take 1 capsule (2 mg total) by mouth as needed for diarrhea or loose stools. 30 capsule 2  . magic mouthwash SOLN Take 5 mLs by mouth 4 (four) times daily as needed for mouth pain. 240 mL 1  . midodrine (PROAMATINE) 10 MG tablet Take 1 tablet daily to support BP (Patient taking differently: Take 10 mg by mouth daily. ) 90 tablet 3  . montelukast (SINGULAIR) 10 MG tablet Take 1 tablet daily for Allergies 90 tablet 1  . nystatin (MYCOSTATIN/NYSTOP) powder Apply topically 2 (two) times daily. 30 g 0  . ondansetron (ZOFRAN ODT) 8 MG disintegrating tablet Take 1 tablet (8 mg total) by mouth every 8 (eight) hours as needed for nausea or vomiting. 20 tablet 0  . pilocarpine (SALAGEN) 5 MG tablet TAKE 1 TABLET BY MOUTH 3 TIMES DAILY FOR DRY MOUTH. (Patient taking differently: Take 5 mg by mouth 2 (two) times daily. ) 270 tablet 1  . ranitidine (ZANTAC) 300 MG tablet Take 1 tablet 2 x /day at [facility] & Bedtime for Heart burn & Reflux (Patient taking differently: Take 300  mg by mouth 2 (two) times daily. ) 180 tablet 3  . sucralfate (CARAFATE) 1 g tablet Take 1 tablet dissolved in water  4 x /day BEFORE Meals & Bedtime for Acid indigestion & Reflux (Patient taking differently: Take 1 g by mouth 4 (four) times daily -  with meals and at bedtime. Take 1 tablet dissolved in water  4 x /day BEFORE Meals & Bedtime for Acid indigestion & Reflux) 360 tablet 1  . traMADol (ULTRAM) 50 MG tablet Take 1 to 2 tablets by mouth 4 times a day as needed for pain. (Patient taking differently: Take 50-100 mg by mouth 4 (four) times daily as needed for moderate pain. Take 1 to 2 tablets by mouth 4 times a day as needed for pain.) 180 tablet 0  . vitamin B-12 (CYANOCOBALAMIN) 1000 MCG tablet Take 1 tablet (1,000 mcg total) by mouth daily. (Patient taking differently: Take 2,000 mcg by mouth daily. ) 30 tablet 0  . fluconazole (DIFLUCAN) 150 MG tablet Take one tablet at onset of symptoms and second tablet three days later for yeast. (Patient not taking: Reported on 03/03/2018) 2 tablet 0  . nitroGLYCERIN (NITROSTAT) 0.4 MG SL tablet Place 0.4 mg under the tongue every 5 (five) minutes as needed for chest pain.     Marland Kitchen umeclidinium-vilanterol (ANORO ELLIPTA) 62.5-25 MCG/INH AEPB Inhale 1 puff into the lungs daily as needed (shortness of breath). (Patient not taking: Reported on 03/03/2018) 1 each 1    Review of Systems: A full ROS was attempted today and was able to be performed.  Systems assessed include - Constitutional, Eyes, HENT, Respiratory, Cardiovascular, Gastrointestinal, Genitourinary, Integument/breast, Hematologic/lymphatic, Musculoskeletal, Neurological, Behavioral/Psych, Endocrine, Allergic/Immunologic - with pertinent responses as per HPI.  Physical Examination: Temp:  [97.9 F (36.6 C)] 97.9 F (36.6 C) (02/17 1115) Pulse Rate:  [63-73] 71 (02/17 1400) Resp:  [15-17] 15 (02/17 1359) BP: (116-144)/(70-84) 134/80 (02/17 1400) SpO2:  [98 %-100 %]  100 % (02/17 1400)  General -  well nourished, well developed, in no apparent distress.    Ophthalmologic - fundi not visualized due to noncooperation.    Cardiovascular - regular rate and rhythm  Mental Status -  Level of arousal and orientation to time, place, and person were intact. Language including expression, naming, repetition, comprehension was assessed and found intact. Attention span and concentration were impaired, not able to backward spelling world.  Cranial Nerves II - XII - II -right lower quadrantanopia III, IV, VI - Extraocular movements intact. V - Facial sensation intact bilaterally. VII - Facial movement intact bilaterally. VIII - Hearing & vestibular intact bilaterally. X - Palate elevates symmetrically. XI - Chin turning & shoulder shrug intact bilaterally. XII - Tongue protrusion intact.  Motor Strength - The patient's strength was symmetrical in all extremities and pronator drift was absent.   Motor Tone & Bulk - Muscle tone was assessed at the neck and appendages and was normal.  Bulk was normal and fasciculations were absent.   Reflexes - The patient's reflexes were normal in all extremities and she had no pathological reflexes.  Sensory - Light touch, temperature/pinprick were assessed and were normal.    Coordination - The patient had normal movements in the hands with no ataxia or dysmetria.  Tremor was absent.  Gait and Station - deferred  Data Reviewed: Dg Chest 2 View  Result Date: 03/03/2018 CLINICAL DATA:  Weakness, dizziness and confusion over the last 2 weeks. History of renal cell cancer and myeloma. EXAM: CHEST - 2 VIEW COMPARISON:  02/26/2018 radiography.  CT 01/20/2018. FINDINGS: Heart size is normal. Chronic aortic atherosclerosis. Power port inserted from a right jugular approach has its tip at the SVC RA junction. The lungs are clear except for mild scarring. No sign of active infiltrate, mass, effusion or collapse. IMPRESSION: No active cardiopulmonary disease.  Electronically Signed   By: Nelson Chimes M.D.   On: 03/03/2018 12:58   Ct Head Wo Contrast  Result Date: 03/03/2018 CLINICAL DATA:  Confused. Altered level of consciousness. Breast cancer. EXAM: CT HEAD WITHOUT CONTRAST TECHNIQUE: Contiguous axial images were obtained from the base of the skull through the vertex without intravenous contrast. COMPARISON:  02/26/2018. FINDINGS: Brain: Two separate areas of ill-defined abnormal cortical and subcortical edema in the LEFT hemisphere, affecting the LEFT parietal and LEFT occipital lobes, could represent multifocal infarcts versus rapid progression of metastatic disease. These areas were previously unremarkable on CT 5 days earlier. MRI of the brain without and with contrast recommended for further evaluation. Elsewhere, mild to moderate atrophy. Hypoattenuation of white matter, consistent with small vessel disease/post treatment effect. Vascular: Calcification of the cavernous internal carotid arteries consistent with cerebrovascular atherosclerotic disease. No signs of intracranial large vessel occlusion. Skull: Calvarium intact. Sinuses/Orbits: No sinus disease. Negative orbits. Other: None. IMPRESSION: Two separate areas of abnormal cortical and subcortical edema in the LEFT hemisphere, affecting the LEFT parietal and LEFT occipital lobes, could represent multifocal infarcts versus rapid progression of metastatic disease. These were not present on the previous CT from 02/26/2018. MRI of the brain without and with contrast recommended for further evaluation. Electronically Signed   By: Staci Righter M.D.   On: 03/03/2018 13:26   Ct Head Wo Contrast  Result Date: 02/26/2018 CLINICAL DATA:  Blackouts for 1 month. History of multiple myeloma. EXAM: CT HEAD WITHOUT CONTRAST TECHNIQUE: Contiguous axial images were obtained from the base of the skull through the vertex without intravenous contrast. COMPARISON:  12/22/2015.  FINDINGS: Brain: No evidence for acute  infarction, hemorrhage, mass lesion, hydrocephalus, or extra-axial fluid. Mild atrophy. Hypoattenuation of white matter, consistent with small vessel disease or post treatment effect. 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 Vascular: Calcification of the cavernous internal carotid arteries consistent with cerebrovascular atherosclerotic disease. No signs of intracranial large vessel occlusion. Skull: Calvarium intact. Small lucency in the RIGHT frontal bone (series 3 image 50) unchanged from 2017. Sinuses/Orbits: No acute finding. Other: None. IMPRESSION: Atrophy and small vessel disease. No acute intracranial findings. Electronically Signed   By: Staci Righter M.D.   On: 02/26/2018 18:23   Mr Brain Wo Contrast  Result Date: 03/03/2018 CLINICAL DATA:  Sudden onset confusion. EXAM: MRI HEAD WITHOUT CONTRAST TECHNIQUE: Multiplanar, multiecho pulse sequences of the brain and surrounding structures were obtained without intravenous contrast. COMPARISON:  Head CT 03/03/2018 FINDINGS: The examination had to be discontinued prior to completion due to altered mental status and inability to follow the technologist's instructions. Coronal and axial diffusion-weighted imaging, sagittal T1-weighted imaging and axial T2-weighted imaging were obtained. There is multifocal abnormal diffusion restriction within both hemispheres, including the left frontal lobe, both parietal lobes, left occipital lobe and both cerebellar hemispheres. The largest area of diffusion abnormalities in the posterior left parietal lobe. There is no midline shift or other mass effect. The midline structures are normal. There is moderate white matter hyperintense T2-weighted signal consistent with chronic microvascular ischemia. IMPRESSION: 1. Examination discontinued early due to patient altered mental status. 2. Multifocal bilateral acute ischemia within multiple vascular territories in both hemispheres. This may indicate a central  cardiac or aortic embolic source. Electronically Signed   By: Ulyses Jarred M.D.   On: 03/03/2018 15:04   Dg Abd Acute 2+v W 1v Chest  Result Date: 02/26/2018 CLINICAL DATA:  Lapse in memory. EXAM: DG ABDOMEN ACUTE W/ 1V CHEST COMPARISON:  None. FINDINGS: There is no evidence of dilated bowel loops or free intraperitoneal air. No radiopaque calculi or other significant radiographic abnormality is seen. Heart size and mediastinal contours are within normal limits. Both lungs are clear. Calcified tortuous aorta. Degenerative RIGHT SI joint. Port-A-Cath, RIGHT chest subclavian approach, tip RIGHT atrium. IMPRESSION: Negative abdominal radiographs. No acute cardiopulmonary disease. Electronically Signed   By: Staci Righter M.D.   On: 02/26/2018 18:16    Assessment: 78 y.o. female with PMH of remote breast cancer, COPD, CKD stage IV, MI/CAD, multiple myeloma in remission admitted for 36hr amnesia 2 weeks ago, confusion last week with UTI, and headache for the last two days.  CT showed left occipital and left frontal parietal infarcts.  MRI confirmed multiple posterior and anterior bilateral infarcts.   She had a remote history of breast cancer, current multiple myeloma in remission, and CKD stage IV with chemotherapy.  Not able to do CT head and neck, will do MRA, carotid Doppler and 2D echo.  She still has pancytopenia likely due to chemotherapy, will hold off aspirin at this time given thrombocytopenia.  Plan: - HgbA1c, fasting lipid panel - MRA of the brain without contrast - PT consult, OT consult, Speech consult - Echocardiogram - Carotid dopplers - Hold off antiplatelet at this time due to severe thrombocytopenia - Risk factor modification - Telemetry monitoring - Frequent neuro checks - stroke team will follow in am  Thank you for this consultation and allowing [place] to participate in the care of this patient.  Rosalin Hawking, MD PhD Stroke Neurology 03/03/2018 5:49 PM

## 2018-03-03 NOTE — ED Notes (Signed)
Pt requests paper tape is allergic to clear tape.

## 2018-03-03 NOTE — Progress Notes (Signed)
Pt back to room from MRI; reported off to oncoming RN. Delia Heady RN

## 2018-03-03 NOTE — ED Provider Notes (Signed)
[facility] DEPT Provider Note   CSN: [id number] Arrival date & time: 03/03/18  1105     History   Chief Complaint Chief Complaint  Patient presents with  . Altered Mental Status    HPI Nancy Norris is a 78 y.o. female.  78 yo F with a cc of confusion.  Patient feels like she needs to do something but cannot remember what it is.  This been going on for the past couple weeks to months.  She was seen in the ED about a week ago with a similar complaint and found to possibly have a urinary tract infection.  Urinary symptoms have resolved but she is still finding that she is having difficulty knowing what she did, states that she cant remember if she took her medicine or fed her cat or if she ate a meal.  She denies new lateral numbness or weakness denies difficulty with speech or swallowing.  Denies cough congestion fevers chills or myalgias denies vomiting or diarrhea.  She is having a severe headache that seems to come and go.  Diffusely about the head.  Worse with bright lights.  Nothing else seems to make it better or worse.  Denies neck pain.  Denies trauma.  The history is provided by the patient.  Altered Mental Status  Associated symptoms: headaches   Associated symptoms: no abdominal pain, no fever, no nausea, no palpitations and no vomiting   Illness  Severity:  Moderate Onset quality:  Sudden Duration:  2 months Timing:  Constant Progression:  Worsening Chronicity:  New Associated symptoms: headaches   Associated symptoms: no abdominal pain, no chest pain, no congestion, no fever, no myalgias, no nausea, no rhinorrhea, no shortness of breath, no vomiting and no wheezing     Past Medical History:  Diagnosis Date  . Anemia   . Anginal pain ([place])    used NTG x 2 May 31 and 06/15/13   . Anxiety   . Arthritis   . B12 deficiency 12/04/2014  . Breast cancer ([place])   . Complication of anesthesia   . COPD (chronic obstructive pulmonary  disease) ([place])   . Cryptococcal pneumonitis ([place]) 11/22/2015  . Depression   . Dizziness   . Dyspnea   . Fibromyalgia   . Fibromyalgia   . GERD (gastroesophageal reflux disease)   . Headache(784.0)   . Heart murmur   . Hemoptysis 10/21/2015  . History of blood transfusion    last one May 12   . Hx of cardiovascular stress test    LexiScan with low level exercise Myoview (02/2013): No ischemia, EF 72%; normal study  . Hx of echocardiogram    a.  Echocardiogram (12/26/2012): EF 61-51%, grade 1 diastolic dysfunction;   b.  Echocardiogram (02/2013): EF 55-60%, no WMA, trivial effusion  . Hyperkalemia   . Hyponatremia   . Hypotension   . Hypothyroidism   . Mucositis   . Multiple myeloma   . Myocardial infarction [facility])    in past, patient was unaware.   . Neuropathy   . Nodule of left lung 09/13/2015  . Pain in joint, pelvic region and thigh 07/07/2015  . Pneumonia    several  . PONV (postoperative nausea and vomiting) 2008   after mastestomy    Patient Active Problem List   Diagnosis Date Noted  . Port catheter in place 09/18/2016  . Cryptococcal pneumonitis ([place]) 11/22/2015  . Anemia in neoplastic disease 03/31/2015  . B12 deficiency 12/04/2014  .  Thrombocytopenia ([place]) 12/04/2014  . Encounter for antineoplastic chemotherapy 06/21/2014  . CKD (chronic kidney disease) stage 5, GFR less than 15 ml/min (HCC) 04/02/2014  . Multiple myeloma ([place]) 12/02/2013  . Major depressive disorder, recurrent episode ([place]) 11/02/2013  . Medication management 09/30/2013  . Vitamin D deficiency 03/24/2013  . Mixed hyperlipidemia 02/24/2013  . COPD (chronic obstructive pulmonary disease) with emphysema ([place]) 02/20/2013  . GERD (gastroesophageal reflux disease) 01/20/2013  . HTN (hypertension) 01/14/2013  . Asthma, chronic 01/13/2013  . Hypothyroid 01/13/2013  . Chronic diastolic heart failure ([place]) 01/13/2013  . HX: breast cancer 12/08/2012  . Hypercalcemia 12/02/2012    Past Surgical  History:  Procedure Laterality Date  . ABDOMINAL HYSTERECTOMY  1981  . AV FISTULA PLACEMENT Left 06/19/2013   Procedure: CREATION OF LEFT ARM ARTERIOVENOUS (AV) FISTULA ;  Surgeon: Angelia Mould, MD;  Location: [place];  Service: Vascular;  Laterality: Left;  . BREAST RECONSTRUCTION    . BREAST SURGERY Right    reduction  . CATARACT EXTRACTION, BILATERAL    . CHOLECYSTECTOMY  1971  . COLONOSCOPY    . EYE SURGERY Bilateral    lens implant  . history of Port removal    . LUNG BIOPSY  10/21/2015  . MASTECTOMY Left 2008  . PORTACATH PLACEMENT  12/2012   has had 2  . RESECTION OF ARTERIOVENOUS FISTULA ANEURYSM Left 06/04/2017   Procedure: LIGATION ANEURYSM OF LEFT ARTERIOVENOUS FISTULA;  Surgeon: Angelia Mould, MD;  Location: [place];  Service: Vascular;  Laterality: Left;  . Status post stem cell transplant on September 28, 2008.       OB History   No obstetric history on file.      Home Medications    Prior to Admission medications   Medication Sig Start Date End Date Taking? Authorizing Provider  acetaminophen (TYLENOL) 500 MG tablet Take 1,000 mg by mouth daily as needed for moderate pain or headache.   Yes [provider]  albuterol (PROVENTIL HFA;VENTOLIN HFA) 108 (90 Base) MCG/ACT inhaler Inhale 1-2 puffs into the lungs every 6 (six) hours as needed for wheezing or shortness of breath (cough). 01/10/18  Yes McClanahan, Danton Sewer, NP  azelastine (ASTELIN) 0.1 % nasal spray Place 1 spray into both nostrils daily. Use in each nostril as directed 01/10/18  Yes McClanahan, Kyra, NP  buPROPion (WELLBUTRIN) 75 MG tablet Take 1 tablet (75 mg total) by mouth 2 (two) times daily. 05/29/17 05/29/18 Yes Liane Comber, NP  cephALEXin (KEFLEX) 500 MG capsule Take 1 capsule (500 mg total) by mouth 3 (three) times daily. 02/26/18  Yes Drenda Freeze, MD  citalopram (CELEXA) 40 MG tablet Take 1 tablet daily for Mood 10/18/17  Yes [name], MD  cromolyn (OPTICROM) 4 %  ophthalmic solution INSTILL 1 TO 2 DROPS IN AFFECTED EYE(S) 4 TIMES A DAY FOR ALLERGIES. Patient taking differently: Place 1-2 drops into both eyes 4 (four) times daily as needed (allergies).  12/27/17  Yes Liane Comber, NP  dexamethasone (DECADRON) 4 MG tablet Take 4 mg by mouth. 5 tabs on day 1 of chemo 12/18/17  Yes [provider]  folic acid (FOLVITE) 1 MG tablet Take 1 tablet (1 mg total) by mouth daily. 03/19/17 03/19/18 Yes Buriev, Arie Sabina, MD  furosemide (LASIX) 40 MG tablet TAKE 1 TABLET BY MOUTH DAILY. Patient taking differently: Take 40 mg by mouth daily as needed for fluid or edema.  12/03/16  Yes [name], MD  gabapentin (NEURONTIN) 300 MG capsule TAKE  1 CAPSULE BY MOUTH 3 TIMES DAILY. Patient taking differently: Take 300-600 mg by mouth See admin instructions. 600 mg at bedtime and an additional 300 mg during the day if "cramping" 02/17/18  Yes [name], MD  ipratropium-albuterol (DUONEB) 0.5-2.5 (3) MG/3ML SOLN Take 3 mLs by nebulization every 4 (four) hours as needed. Max:6 doses per day 01/10/18  Yes McClanahan, Kyra, NP  levocetirizine (XYZAL) 5 MG tablet Take 1 tablet (5 mg total) by mouth every evening. 01/10/18 01/11/19 Yes McClanahan, Kyra, NP  levothyroxine (SYNTHROID, LEVOTHROID) 150 MCG tablet TAKE 1 TABLET BY MOUTH DAILY. NEED OFFICE VISIT FOR FURTHER REFILLS. Patient taking differently: Take by mouth daily before breakfast. TAKE 1 TABLET BY MOUTH DAILY. NEED OFFICE VISIT FOR FURTHER REFILLS. 10/03/17  Yes [name], MD  loperamide (IMODIUM) 2 MG capsule Take 1 capsule (2 mg total) by mouth as needed for diarrhea or loose stools. 02/28/16  Yes [name], MD  magic mouthwash SOLN Take 5 mLs by mouth 4 (four) times daily as needed for mouth pain. 10/27/15  Yes Curt Bears, MD  midodrine (PROAMATINE) 10 MG tablet Take 1 tablet daily to support BP Patient taking differently: Take 10 mg by mouth daily.  12/03/17  Yes [name], MD    montelukast (SINGULAIR) 10 MG tablet Take 1 tablet daily for Allergies 10/18/17  Yes [name], MD  nystatin (MYCOSTATIN/NYSTOP) powder Apply topically 2 (two) times daily. 01/10/18  Yes McClanahan, Kyra, NP  ondansetron (ZOFRAN ODT) 8 MG disintegrating tablet Take 1 tablet (8 mg total) by mouth every 8 (eight) hours as needed for nausea or vomiting. 12/17/17  Yes Curt Bears, MD  pilocarpine (SALAGEN) 5 MG tablet TAKE 1 TABLET BY MOUTH 3 TIMES DAILY FOR DRY MOUTH. Patient taking differently: Take 5 mg by mouth 2 (two) times daily.  09/26/16  Yes [name], MD  ranitidine (ZANTAC) 300 MG tablet Take 1 tablet 2 x /day at [facility] & Bedtime for Heart burn & Reflux Patient taking differently: Take 300 mg by mouth 2 (two) times daily.  09/16/17  Yes [name], MD  sucralfate (CARAFATE) 1 g tablet Take 1 tablet dissolved in water  4 x /day BEFORE Meals & Bedtime for Acid indigestion & Reflux Patient taking differently: Take 1 g by mouth 4 (four) times daily -  with meals and at bedtime. Take 1 tablet dissolved in water  4 x /day BEFORE Meals & Bedtime for Acid indigestion & Reflux 10/04/17  Yes [name], MD  traMADol (ULTRAM) 50 MG tablet Take 1 to 2 tablets by mouth 4 times a day as needed for pain. Patient taking differently: Take 50-100 mg by mouth 4 (four) times daily as needed for moderate pain. Take 1 to 2 tablets by mouth 4 times a day as needed for pain. 07/22/15  Yes [name], MD  vitamin B-12 (CYANOCOBALAMIN) 1000 MCG tablet Take 1 tablet (1,000 mcg total) by mouth daily. Patient taking differently: Take 2,000 mcg by mouth daily.  03/19/17  Yes Kinnie Feil, MD  fluconazole (DIFLUCAN) 150 MG tablet Take one tablet at onset of symptoms and second tablet three days later for yeast. Patient not taking: Reported on 03/03/2018 01/10/18   Garnet Sierras, NP  nitroGLYCERIN (NITROSTAT) 0.4 MG SL tablet Place 0.4 mg under the tongue every 5 (five) minutes as needed for  chest pain.     [provider]  umeclidinium-vilanterol (ANORO ELLIPTA) 62.5-25 MCG/INH AEPB Inhale 1 puff into the lungs daily as needed (shortness of  breath). Patient not taking: Reported on 03/03/2018 01/10/18   Garnet Sierras, NP    Family History Family History  Problem Relation Age of Onset  . Arthritis Mother   . Asthma Mother   . Cancer Sister   . Hyperlipidemia Brother     Social History Social History   Tobacco Use  . Smoking status: Former Smoker    Packs/day: 1.00    Years: 30.00    Pack years: 30.00    Types: Cigarettes    Last attempt to quit: 02/15/2005    Years since quitting: 13.0  . Smokeless tobacco: Never Used  Substance Use Topics  . Alcohol use: No  . Drug use: No     Allergies   Codeine; Latex; Chocolate; Onion; Zyprexa [olanzapine]; Adhesive [tape]; Heparin; Hydrocodone; Iodinated diagnostic agents; Oxycodone; Promethazine-dm; and Sulfa antibiotics   Review of Systems Review of Systems  Constitutional: Positive for activity change. Negative for chills and fever.  HENT: Negative for congestion and rhinorrhea.   Eyes: Negative for redness and visual disturbance.  Respiratory: Negative for shortness of breath and wheezing.   Cardiovascular: Negative for chest pain and palpitations.  Gastrointestinal: Negative for abdominal pain, nausea and vomiting.  Genitourinary: Negative for dysuria and urgency.  Musculoskeletal: Negative for arthralgias and myalgias.  Skin: Negative for pallor and wound.  Neurological: Positive for headaches. Negative for dizziness.     Physical Exam Updated Vital Signs BP 134/80   Pulse 71   Temp 97.9 F (36.6 C) (Oral)   Resp 15   SpO2 100%   Physical Exam Vitals signs and nursing note reviewed.  Constitutional:      General: She is not in acute distress.    Appearance: She is well-developed. She is not diaphoretic.  HENT:     Head: Normocephalic and atraumatic.  Eyes:     Pupils: Pupils are  equal, round, and reactive to light.  Neck:     Musculoskeletal: Normal range of motion and neck supple.  Cardiovascular:     Rate and Rhythm: Normal rate and regular rhythm.     Heart sounds: No murmur. No friction rub. No gallop.   Pulmonary:     Effort: Pulmonary effort is normal.     Breath sounds: No wheezing or rales.  Abdominal:     General: There is no distension.     Palpations: Abdomen is soft.     Tenderness: There is no abdominal tenderness.  Musculoskeletal:        General: No tenderness.  Skin:    General: Skin is warm and dry.  Neurological:     Mental Status: She is alert and oriented to person, place, and time.     Cranial Nerves: Cranial nerves are intact.     Sensory: Sensation is intact.     Motor: Motor function is intact.     Comments: Slow gait, patient feels that it is at her baseline.  States she walks wide gated like a toddler.  Cannot feel bilateral feet.  Psychiatric:        Behavior: Behavior normal.      ED Treatments / Results  Labs (all labs ordered are listed, but only abnormal results are displayed) Labs Reviewed  COMPREHENSIVE METABOLIC PANEL - Abnormal; Notable for the following components:      Result Value   CO2 20 (*)    Glucose, Bld 105 (*)    Creatinine, Ser 2.40 (*)    Calcium 8.3 (*)    Total Protein  5.6 (*)    GFR calc non Af Amer 19 (*)    GFR calc Af Amer 22 (*)    All other components within normal limits  CBC WITH DIFFERENTIAL/PLATELET - Abnormal; Notable for the following components:   WBC 2.4 (*)    RBC 2.56 (*)    Hemoglobin 8.3 (*)    HCT 26.8 (*)    MCV 104.7 (*)    RDW 24.2 (*)    Platelets 54 (*)    Neutro Abs 1.1 (*)    All other components within normal limits  URINALYSIS, ROUTINE W REFLEX MICROSCOPIC - Abnormal; Notable for the following components:   Color, Urine STRAW (*)    Specific Gravity, Urine 1.004 (*)    Glucose, UA 50 (*)    Hgb urine dipstick SMALL (*)    Bacteria, UA RARE (*)    All other  components within normal limits  BLOOD GAS, VENOUS - Abnormal; Notable for the following components:   pCO2, Ven 41.7 (*)    Acid-base deficit 2.9 (*)    All other components within normal limits  SEDIMENTATION RATE - Abnormal; Notable for the following components:   Sed Rate 30 (*)    All other components within normal limits  C-REACTIVE PROTEIN - Abnormal; Notable for the following components:   CRP 6.0 (*)    All other components within normal limits  CULTURE, BLOOD (ROUTINE X 2)  CULTURE, BLOOD (ROUTINE X 2)  LACTIC ACID, PLASMA  PROTIME-INR  AMMONIA  ETHANOL    EKG None  Radiology Dg Chest 2 View  Result Date: 03/03/2018 CLINICAL DATA:  Weakness, dizziness and confusion over the last 2 weeks. History of renal cell cancer and myeloma. EXAM: CHEST - 2 VIEW COMPARISON:  02/26/2018 radiography.  CT 01/20/2018. FINDINGS: Heart size is normal. Chronic aortic atherosclerosis. Power port inserted from a right jugular approach has its tip at the SVC RA junction. The lungs are clear except for mild scarring. No sign of active infiltrate, mass, effusion or collapse. IMPRESSION: No active cardiopulmonary disease. Electronically Signed   By: Nelson Chimes M.D.   On: 03/03/2018 12:58   Ct Head Wo Contrast  Result Date: 03/03/2018 CLINICAL DATA:  Confused. Altered level of consciousness. Breast cancer. EXAM: CT HEAD WITHOUT CONTRAST TECHNIQUE: Contiguous axial images were obtained from the base of the skull through the vertex without intravenous contrast. COMPARISON:  02/26/2018. FINDINGS: Brain: Two separate areas of ill-defined abnormal cortical and subcortical edema in the LEFT hemisphere, affecting the LEFT parietal and LEFT occipital lobes, could represent multifocal infarcts versus rapid progression of metastatic disease. These areas were previously unremarkable on CT 5 days earlier. MRI of the brain without and with contrast recommended for further evaluation. Elsewhere, mild to moderate  atrophy. Hypoattenuation of white matter, consistent with small vessel disease/post treatment effect. Vascular: Calcification of the cavernous internal carotid arteries consistent with cerebrovascular atherosclerotic disease. No signs of intracranial large vessel occlusion. Skull: Calvarium intact. Sinuses/Orbits: No sinus disease. Negative orbits. Other: None. IMPRESSION: Two separate areas of abnormal cortical and subcortical edema in the LEFT hemisphere, affecting the LEFT parietal and LEFT occipital lobes, could represent multifocal infarcts versus rapid progression of metastatic disease. These were not present on the previous CT from 02/26/2018. MRI of the brain without and with contrast recommended for further evaluation. Electronically Signed   By: Staci Righter M.D.   On: 03/03/2018 13:26   Mr Brain Wo Contrast  Result Date: 03/03/2018 CLINICAL DATA:  Sudden onset  confusion. EXAM: MRI HEAD WITHOUT CONTRAST TECHNIQUE: Multiplanar, multiecho pulse sequences of the brain and surrounding structures were obtained without intravenous contrast. COMPARISON:  Head CT 03/03/2018 FINDINGS: The examination had to be discontinued prior to completion due to altered mental status and inability to follow the technologist's instructions. Coronal and axial diffusion-weighted imaging, sagittal T1-weighted imaging and axial T2-weighted imaging were obtained. There is multifocal abnormal diffusion restriction within both hemispheres, including the left frontal lobe, both parietal lobes, left occipital lobe and both cerebellar hemispheres. The largest area of diffusion abnormalities in the posterior left parietal lobe. There is no midline shift or other mass effect. The midline structures are normal. There is moderate white matter hyperintense T2-weighted signal consistent with chronic microvascular ischemia. IMPRESSION: 1. Examination discontinued early due to patient altered mental status. 2. Multifocal bilateral acute  ischemia within multiple vascular territories in both hemispheres. This may indicate a central cardiac or aortic embolic source. Electronically Signed   By: Ulyses Jarred M.D.   On: 03/03/2018 15:04    Procedures Procedures (including critical care time)  Medications Ordered in ED Medications  sodium chloride flush (NS) 0.9 % injection 3 mL (3 mLs Intravenous Not Given 03/03/18 1235)  sodium chloride 0.9 % bolus 1,000 mL (0 mLs Intravenous Stopped 03/03/18 1342)  prochlorperazine (COMPAZINE) injection 5 mg (5 mg Intravenous Given 03/03/18 1329)  diphenhydrAMINE (BENADRYL) injection 12.5 mg (12.5 mg Intravenous Given 03/03/18 1329)     Initial Impression / Assessment and Plan / ED Course  I have reviewed the triage vital signs and the nursing notes.  Pertinent labs & imaging results that were available during my care of the patient were reviewed by me and considered in my medical decision making (see chart for details).     78 yo F with a chief complaint of worsening confusion at home.  Going on for the past couple weeks to months.  Seen about a week ago and thought to have a urinary tract infection and discharged home on antibiotics.  Work-up here with a CT scan concerning for multiple metastasis to the brain.  Will discuss with neurology.  Order an MRI.  MRI negative for metastasis but positive for multiple strokes.  Discussed with Dr. Leonel Ramsay who recommended admission to [facility].   The patients results and plan were reviewed and discussed.   Any x-rays performed were independently reviewed by myself.   Differential diagnosis were considered with the presenting HPI.  Medications  sodium chloride flush (NS) 0.9 % injection 3 mL (3 mLs Intravenous Not Given 03/03/18 1235)  sodium chloride 0.9 % bolus 1,000 mL (0 mLs Intravenous Stopped 03/03/18 1342)  prochlorperazine (COMPAZINE) injection 5 mg (5 mg Intravenous Given 03/03/18 1329)  diphenhydrAMINE (BENADRYL) injection 12.5 mg (12.5 mg  Intravenous Given 03/03/18 1329)    Vitals:   03/03/18 1309 03/03/18 1334 03/03/18 1359 03/03/18 1400  BP: (!) 143/79 134/70 (!) 144/75 134/80  Pulse: 63 73 71 71  Resp:   15   Temp:      TempSrc:      SpO2: 100% 100% 98% 100%    Final diagnoses:  Acute cardioembolic stroke [facility])    Admission/ observation were discussed with the admitting physician, patient and/or family and they are comfortable with the plan.    Final Clinical Impressions(s) / ED Diagnoses   Final diagnoses:  Acute cardioembolic stroke [facility])    ED Discharge Orders    None       Deno Etienne, DO 03/03/18 1532

## 2018-03-03 NOTE — H&P (Signed)
History and Physical    Nancy Norris GEX:[id number] DOB: 1940-08-20 DOA: 03/03/2018  PCP: [name], MD  Patient coming from: home  I have personally briefly reviewed patient's old medical records in [place]  Chief Complaint: altered mental status  HPI: Nancy Norris is a 78 y.o. female with medical history significant of multiple myeloma, COPD, hypothyroidism, reflux, anemia and multiple other chronic medical issues presenting with confusion and altered mental status over the past few days.   History is obtained with assistance from the patient's daughter.  Patient notes that she is having word finding difficulties.  She describes it hard to explain what been going on but thinks that her symptoms started within the past few days.  Her daughter clarifies that she has had she fainted within the past few days.  She has had recurrent headaches in the past few weeks.  Within the last few days, the family has noted that she has had difficulty with speech.  On Saturday, they noted that she was not quite herself.  Today she was confused and scared.  She denies any fevers, chills, cough, chest pain, shortness of breath.  She notes nausea.  She denies seeing any facial droop purse or noticing any slurred speech.  ED Course: Labs, head CT and MRI.  Admit for stroke to [facility].   Review of Systems: As per HPI otherwise 10 point review of systems negative.   Past Medical History:  Diagnosis Date  . Anemia   . Anginal pain ([place])    used NTG x 2 May 31 and 06/15/13   . Anxiety   . Arthritis   . B12 deficiency 12/04/2014  . Breast cancer ([place])   . Complication of anesthesia   . COPD (chronic obstructive pulmonary disease) ([place])   . Cryptococcal pneumonitis ([place]) 11/22/2015  . Depression   . Dizziness   . Dyspnea   . Fibromyalgia   . Fibromyalgia   . GERD (gastroesophageal reflux disease)   . Headache(784.0)   . Heart murmur   . Hemoptysis 10/21/2015  . History of blood  transfusion    last one May 12   . Hx of cardiovascular stress test    LexiScan with low level exercise Myoview (02/2013): No ischemia, EF 72%; normal study  . Hx of echocardiogram    a.  Echocardiogram (12/26/2012): EF 01-02%, grade 1 diastolic dysfunction;   b.  Echocardiogram (02/2013): EF 55-60%, no WMA, trivial effusion  . Hyperkalemia   . Hyponatremia   . Hypotension   . Hypothyroidism   . Mucositis   . Multiple myeloma   . Myocardial infarction [facility])    in past, patient was unaware.   . Neuropathy   . Nodule of left lung 09/13/2015  . Pain in joint, pelvic region and thigh 07/07/2015  . Pneumonia    several  . PONV (postoperative nausea and vomiting) 2008   after mastestomy    Past Surgical History:  Procedure Laterality Date  . ABDOMINAL HYSTERECTOMY  1981  . AV FISTULA PLACEMENT Left 06/19/2013   Procedure: CREATION OF LEFT ARM ARTERIOVENOUS (AV) FISTULA ;  Surgeon: Angelia Mould, MD;  Location: [place];  Service: Vascular;  Laterality: Left;  . BREAST RECONSTRUCTION    . BREAST SURGERY Right    reduction  . CATARACT EXTRACTION, BILATERAL    . CHOLECYSTECTOMY  1971  . COLONOSCOPY    . EYE SURGERY Bilateral    lens implant  . history of Port removal    .  LUNG BIOPSY  10/21/2015  . MASTECTOMY Left 2008  . PORTACATH PLACEMENT  12/2012   has had 2  . RESECTION OF ARTERIOVENOUS FISTULA ANEURYSM Left 06/04/2017   Procedure: LIGATION ANEURYSM OF LEFT ARTERIOVENOUS FISTULA;  Surgeon: Angelia Mould, MD;  Location: [place];  Service: Vascular;  Laterality: Left;  . Status post stem cell transplant on September 28, 2008.       reports that she quit smoking about 13 years ago. Her smoking use included cigarettes. She has a 30.00 pack-year smoking history. She has never used smokeless tobacco. She reports that she does not drink alcohol or use drugs.  Allergies  Allergen Reactions  . Codeine Anaphylaxis    anaphylasix tolarates oxycodone per care cast  . Latex  Shortness Of Breath  . Chocolate     migraines  . Onion Other (See Comments)    Causes migraine headaches  . Zyprexa [Olanzapine] Other (See Comments)    Confusion , dizzy,unsteady  . Adhesive [Tape] Other (See Comments)    blisters  . Heparin Rash    Can flush port with heparin  . Hydrocodone Rash    With extreme itching  . Iodinated Diagnostic Agents Hives, Itching, Rash and Other (See Comments)    Happened 60 years ago Stage IV kidney function  . Oxycodone Rash  . Promethazine-Dm Hives  . Sulfa Antibiotics Itching    Family History  Problem Relation Age of Onset  . Arthritis Mother   . Asthma Mother   . Cancer Sister   . Hyperlipidemia Brother    Prior to Admission medications   Medication Sig Start Date End Date Taking? Authorizing Provider  acetaminophen (TYLENOL) 500 MG tablet Take 1,000 mg by mouth daily as needed for moderate pain or headache.   Yes [provider]  albuterol (PROVENTIL HFA;VENTOLIN HFA) 108 (90 Base) MCG/ACT inhaler Inhale 1-2 puffs into the lungs every 6 (six) hours as needed for wheezing or shortness of breath (cough). 01/10/18  Yes McClanahan, Danton Sewer, NP  azelastine (ASTELIN) 0.1 % nasal spray Place 1 spray into both nostrils daily. Use in each nostril as directed 01/10/18  Yes McClanahan, Kyra, NP  buPROPion (WELLBUTRIN) 75 MG tablet Take 1 tablet (75 mg total) by mouth 2 (two) times daily. 05/29/17 05/29/18 Yes Liane Comber, NP  cephALEXin (KEFLEX) 500 MG capsule Take 1 capsule (500 mg total) by mouth 3 (three) times daily. 02/26/18  Yes Drenda Freeze, MD  citalopram (CELEXA) 40 MG tablet Take 1 tablet daily for Mood 10/18/17  Yes [name], MD  cromolyn (OPTICROM) 4 % ophthalmic solution INSTILL 1 TO 2 DROPS IN AFFECTED EYE(S) 4 TIMES A DAY FOR ALLERGIES. Patient taking differently: Place 1-2 drops into both eyes 4 (four) times daily as needed (allergies).  12/27/17  Yes Liane Comber, NP  dexamethasone (DECADRON) 4 MG tablet  Take 4 mg by mouth. 5 tabs on day 1 of chemo 12/18/17  Yes [provider]  folic acid (FOLVITE) 1 MG tablet Take 1 tablet (1 mg total) by mouth daily. 03/19/17 03/19/18 Yes Buriev, Arie Sabina, MD  furosemide (LASIX) 40 MG tablet TAKE 1 TABLET BY MOUTH DAILY. Patient taking differently: Take 40 mg by mouth daily as needed for fluid or edema.  12/03/16  Yes [name], MD  gabapentin (NEURONTIN) 300 MG capsule TAKE 1 CAPSULE BY MOUTH 3 TIMES DAILY. Patient taking differently: Take 300-600 mg by mouth See admin instructions. 600 mg at bedtime and an additional 300 mg during the day  if "cramping" 02/17/18  Yes [name], MD  ipratropium-albuterol (DUONEB) 0.5-2.5 (3) MG/3ML SOLN Take 3 mLs by nebulization every 4 (four) hours as needed. Max:6 doses per day 01/10/18  Yes McClanahan, Kyra, NP  levocetirizine (XYZAL) 5 MG tablet Take 1 tablet (5 mg total) by mouth every evening. 01/10/18 01/11/19 Yes McClanahan, Kyra, NP  levothyroxine (SYNTHROID, LEVOTHROID) 150 MCG tablet TAKE 1 TABLET BY MOUTH DAILY. NEED OFFICE VISIT FOR FURTHER REFILLS. Patient taking differently: Take by mouth daily before breakfast. TAKE 1 TABLET BY MOUTH DAILY. NEED OFFICE VISIT FOR FURTHER REFILLS. 10/03/17  Yes [name], MD  loperamide (IMODIUM) 2 MG capsule Take 1 capsule (2 mg total) by mouth as needed for diarrhea or loose stools. 02/28/16  Yes [name], MD  magic mouthwash SOLN Take 5 mLs by mouth 4 (four) times daily as needed for mouth pain. 10/27/15  Yes Curt Bears, MD  midodrine (PROAMATINE) 10 MG tablet Take 1 tablet daily to support BP Patient taking differently: Take 10 mg by mouth daily.  12/03/17  Yes [name], MD  montelukast (SINGULAIR) 10 MG tablet Take 1 tablet daily for Allergies 10/18/17  Yes [name], MD  nystatin (MYCOSTATIN/NYSTOP) powder Apply topically 2 (two) times daily. 01/10/18  Yes McClanahan, Kyra, NP  ondansetron (ZOFRAN ODT) 8 MG disintegrating  tablet Take 1 tablet (8 mg total) by mouth every 8 (eight) hours as needed for nausea or vomiting. 12/17/17  Yes Curt Bears, MD  pilocarpine (SALAGEN) 5 MG tablet TAKE 1 TABLET BY MOUTH 3 TIMES DAILY FOR DRY MOUTH. Patient taking differently: Take 5 mg by mouth 2 (two) times daily.  09/26/16  Yes [name], MD  ranitidine (ZANTAC) 300 MG tablet Take 1 tablet 2 x /day at [facility] & Bedtime for Heart burn & Reflux Patient taking differently: Take 300 mg by mouth 2 (two) times daily.  09/16/17  Yes [name], MD  sucralfate (CARAFATE) 1 g tablet Take 1 tablet dissolved in water  4 x /day BEFORE Meals & Bedtime for Acid indigestion & Reflux Patient taking differently: Take 1 g by mouth 4 (four) times daily -  with meals and at bedtime. Take 1 tablet dissolved in water  4 x /day BEFORE Meals & Bedtime for Acid indigestion & Reflux 10/04/17  Yes [name], MD  traMADol (ULTRAM) 50 MG tablet Take 1 to 2 tablets by mouth 4 times a day as needed for pain. Patient taking differently: Take 50-100 mg by mouth 4 (four) times daily as needed for moderate pain. Take 1 to 2 tablets by mouth 4 times a day as needed for pain. 07/22/15  Yes [name], MD  vitamin B-12 (CYANOCOBALAMIN) 1000 MCG tablet Take 1 tablet (1,000 mcg total) by mouth daily. Patient taking differently: Take 2,000 mcg by mouth daily.  03/19/17  Yes Kinnie Feil, MD  fluconazole (DIFLUCAN) 150 MG tablet Take one tablet at onset of symptoms and second tablet three days later for yeast. Patient not taking: Reported on 03/03/2018 01/10/18   Garnet Sierras, NP  nitroGLYCERIN (NITROSTAT) 0.4 MG SL tablet Place 0.4 mg under the tongue every 5 (five) minutes as needed for chest pain.     [provider]  umeclidinium-vilanterol (ANORO ELLIPTA) 62.5-25 MCG/INH AEPB Inhale 1 puff into the lungs daily as needed (shortness of breath). Patient not taking: Reported on 03/03/2018 01/10/18   Garnet Sierras, NP    Physical  Exam: Vitals:   03/03/18 1830 03/03/18 1858 03/03/18 1859 03/03/18 1930  BP:  130/81 (!) 145/77 (!) 145/77 137/76  Pulse: 80 81 82 81  Resp:   15   Temp:      TempSrc:      SpO2: 100% 97% 100% 100%    Constitutional: NAD, calm, comfortable Vitals:   03/03/18 1830 03/03/18 1858 03/03/18 1859 03/03/18 1930  BP: 130/81 (!) 145/77 (!) 145/77 137/76  Pulse: 80 81 82 81  Resp:   15   Temp:      TempSrc:      SpO2: 100% 97% 100% 100%   Eyes: PERRL, lids and conjunctivae normal ENMT: Mucous membranes are moist. Posterior pharynx clear of any exudate or lesions.Normal dentition.  Neck: normal, supple, no masses, no thyromegaly Respiratory: clear to auscultation bilaterally, no wheezing, no crackles. Normal respiratory effort. No accessory muscle use.  Cardiovascular: Regular rate and rhythm, no murmurs / rubs / gallops. No extremity edema. 2+ pedal pulses. No carotid bruits.  Abdomen: no tenderness, no masses palpated. No hepatosplenomegaly. Bowel sounds positive.  Musculoskeletal: no clubbing / cyanosis. No joint deformity upper and lower extremities. Good ROM, no contractures. Normal muscle tone.  Skin: no rashes, lesions, ulcers. No induration Neurologic: CN 2-12 intact. Sensation intact. Strength 5/5 in all 4.  Some word finding difficulty on my exam. Psychiatric: Normal judgment and insight. Alert and oriented x 3. Normal mood.   Labs on Admission: I have personally reviewed following labs and imaging studies  CBC: Recent Labs  Lab 02/26/18 1154 02/26/18 1636 03/03/18 1243  WBC 3.3* 3.0* 2.4*  NEUTROABS 1.6* 1.3* 1.1*  HGB 8.6* 7.9* 8.3*  HCT 26.6* 25.3* 26.8*  MCV 98.2 100.4* 104.7*  PLT 28* 27* 54*   Basic Metabolic Panel: Recent Labs  Lab 02/26/18 1154 02/26/18 1636 03/03/18 1243  NA 143 140 140  K 4.0 3.5 3.5  CL 112* 111 110  CO2 22 23 20*  GLUCOSE 109* 117* 105*  BUN '19 18 20  '$ CREATININE 2.36* 2.29* 2.40*  CALCIUM 8.1* 8.1* 8.3*   GFR: Estimated  Creatinine Clearance: 20.8 mL/min (A) (by C-G formula based on SCr of 2.4 mg/dL (H)). Liver Function Tests: Recent Labs  Lab 02/26/18 1154 02/26/18 1636 03/03/18 1243  AST 9* 15 16  ALT '7 10 9  '$ ALKPHOS 83 74 83  BILITOT 0.9 0.7 1.1  PROT 5.4* 5.1* 5.6*  ALBUMIN 3.0* 3.0* 3.5   No results for input(s): LIPASE, AMYLASE in the last 168 hours. Recent Labs  Lab 03/03/18 1341  AMMONIA 10   Coagulation Profile: Recent Labs  Lab 03/03/18 1243  INR 0.90   Cardiac Enzymes: Recent Labs  Lab 02/26/18 1636  CKTOTAL 10*   BNP (last 3 results) No results for input(s): PROBNP in the last 8760 hours. HbA1C: No results for input(s): HGBA1C in the last 72 hours. CBG: No results for input(s): GLUCAP in the last 168 hours. Lipid Profile: No results for input(s): CHOL, HDL, LDLCALC, TRIG, CHOLHDL, LDLDIRECT in the last 72 hours. Thyroid Function Tests: No results for input(s): TSH, T4TOTAL, FREET4, T3FREE, THYROIDAB in the last 72 hours. Anemia Panel: No results for input(s): VITAMINB12, FOLATE, FERRITIN, TIBC, IRON, RETICCTPCT in the last 72 hours. Urine analysis:    Component Value Date/Time   COLORURINE STRAW (A) 03/03/2018 1340   APPEARANCEUR CLEAR 03/03/2018 1340   LABSPEC 1.004 (L) 03/03/2018 1340   LABSPEC 1.010 09/19/2016 0908   PHURINE 6.0 03/03/2018 1340   GLUCOSEU 50 (A) 03/03/2018 1340   GLUCOSEU Negative 09/19/2016 0908   HGBUR SMALL (A) 03/03/2018  [place] 03/03/2018 1340   BILIRUBINUR Negative 09/19/2016 0908   KETONESUR NEGATIVE 03/03/2018 1340   PROTEINUR NEGATIVE 03/03/2018 1340   UROBILINOGEN 0.2 09/19/2016 0908   NITRITE NEGATIVE 03/03/2018 1340   LEUKOCYTESUR NEGATIVE 03/03/2018 1340   LEUKOCYTESUR Negative 09/19/2016 0908    Radiological Exams on Admission: Dg Chest 2 View  Result Date: 03/03/2018 CLINICAL DATA:  Weakness, dizziness and confusion over the last 2 weeks. History of renal cell cancer and myeloma. EXAM: CHEST - 2 VIEW  COMPARISON:  02/26/2018 radiography.  CT 01/20/2018. FINDINGS: Heart size is normal. Chronic aortic atherosclerosis. Power port inserted from a right jugular approach has its tip at the SVC RA junction. The lungs are clear except for mild scarring. No sign of active infiltrate, mass, effusion or collapse. IMPRESSION: No active cardiopulmonary disease. Electronically Signed   By: Nelson Chimes M.D.   On: 03/03/2018 12:58   Ct Head Wo Contrast  Result Date: 03/03/2018 CLINICAL DATA:  Confused. Altered level of consciousness. Breast cancer. EXAM: CT HEAD WITHOUT CONTRAST TECHNIQUE: Contiguous axial images were obtained from the base of the skull through the vertex without intravenous contrast. COMPARISON:  02/26/2018. FINDINGS: Brain: Two separate areas of ill-defined abnormal cortical and subcortical edema in the LEFT hemisphere, affecting the LEFT parietal and LEFT occipital lobes, could represent multifocal infarcts versus rapid progression of metastatic disease. These areas were previously unremarkable on CT 5 days earlier. MRI of the brain without and with contrast recommended for further evaluation. Elsewhere, mild to moderate atrophy. Hypoattenuation of white matter, consistent with small vessel disease/post treatment effect. Vascular: Calcification of the cavernous internal carotid arteries consistent with cerebrovascular atherosclerotic disease. No signs of intracranial large vessel occlusion. Skull: Calvarium intact. Sinuses/Orbits: No sinus disease. Negative orbits. Other: None. IMPRESSION: Two separate areas of abnormal cortical and subcortical edema in the LEFT hemisphere, affecting the LEFT parietal and LEFT occipital lobes, could represent multifocal infarcts versus rapid progression of metastatic disease. These were not present on the previous CT from 02/26/2018. MRI of the brain without and with contrast recommended for further evaluation. Electronically Signed   By: Staci Righter M.D.   On:  03/03/2018 13:26   Mr Brain Wo Contrast  Result Date: 03/03/2018 CLINICAL DATA:  Sudden onset confusion. EXAM: MRI HEAD WITHOUT CONTRAST TECHNIQUE: Multiplanar, multiecho pulse sequences of the brain and surrounding structures were obtained without intravenous contrast. COMPARISON:  Head CT 03/03/2018 FINDINGS: The examination had to be discontinued prior to completion due to altered mental status and inability to follow the technologist's instructions. Coronal and axial diffusion-weighted imaging, sagittal T1-weighted imaging and axial T2-weighted imaging were obtained. There is multifocal abnormal diffusion restriction within both hemispheres, including the left frontal lobe, both parietal lobes, left occipital lobe and both cerebellar hemispheres. The largest area of diffusion abnormalities in the posterior left parietal lobe. There is no midline shift or other mass effect. The midline structures are normal. There is moderate white matter hyperintense T2-weighted signal consistent with chronic microvascular ischemia. IMPRESSION: 1. Examination discontinued early due to patient altered mental status. 2. Multifocal bilateral acute ischemia within multiple vascular territories in both hemispheres. This may indicate a central cardiac or aortic embolic source. Electronically Signed   By: Ulyses Jarred M.D.   On: 03/03/2018 15:04    EKG: Independently reviewed. pending  Assessment/Plan Active Problems:   Stroke [facility])  Acute Stroke MRI with multifocal bilateral acute ischemia within multiple vascular territories Concerning for central cardiac or aortic embolic source Received ASA  x1, but holding further antiplatelets given thrombocytopenia A1c, lipids Permissive hypertension Echo, carotid US, MRA Permissive hypertension Telemetry Neurology c/s, appreciate recs PT/OT/SLP  Multiple Myeloma: follows with Dr. Julien Nordmann.  Currently on chemo with carfilzomib, cytoxan, decadron.  Most recently on  02/19/18. Consider discussion with Dr. Julien Nordmann as needed  Hypotension: continue midodrine.  Hold lasix with permissive hypertension.  Depression: continue wellbutrin and celexa  COPD: continue home inhalers prn, she's no longer taking anoro ellipta  GERD: continue H2 blocker  Hypothyroidism: continue synthroid  Pancytopenia: chronic thrombocyopenia, anemia, leukopenia.  Likely 2/2 chemo.  ANC is 1.1.  Follow closely.  CKD Stage IV: creatinine appears close to baseline.  ~2.3-2.5.   Renally dose meds, follow Avoid nephrotoxins, avoid contrast  Recent treatment for UTI, appears no clear sx.  Follow.  Will hold keflex as sx most likely 2/2 stroke.  DVT prophylaxis: SCD  Code Status: DNR, discussed with pt and daughter  Family Communication: daughter at bedside  Disposition Plan: pending further workup Consults called: neurology  Admission status: inpatient given CVA    Fayrene Helper MD [facility] Pager AMION  If 7PM-7AM, please contact night-coverage [email or web address] Password [facility]  03/03/2018, 7:55 PM

## 2018-03-04 ENCOUNTER — Inpatient Hospital Stay (HOSPITAL_COMMUNITY): Payer: Medicare Other

## 2018-03-04 ENCOUNTER — Telehealth: Payer: Self-pay | Admitting: Medical Oncology

## 2018-03-04 DIAGNOSIS — I6340 Cerebral infarction due to embolism of unspecified cerebral artery: Secondary | ICD-10-CM

## 2018-03-04 DIAGNOSIS — I6300 Cerebral infarction due to thrombosis of unspecified precerebral artery: Secondary | ICD-10-CM

## 2018-03-04 LAB — ANTITHROMBIN III: AntiThromb III Func: 104 % (ref 75–120)

## 2018-03-04 LAB — BLOOD CULTURE ID PANEL (REFLEXED)
ACINETOBACTER BAUMANNII: NOT DETECTED
CANDIDA ALBICANS: NOT DETECTED
Candida glabrata: NOT DETECTED
Candida krusei: NOT DETECTED
Candida parapsilosis: NOT DETECTED
Candida tropicalis: NOT DETECTED
Carbapenem resistance: NOT DETECTED
ENTEROBACTERIACEAE SPECIES: DETECTED — AB
Enterobacter cloacae complex: NOT DETECTED
Enterococcus species: NOT DETECTED
Escherichia coli: NOT DETECTED
Haemophilus influenzae: NOT DETECTED
Klebsiella oxytoca: NOT DETECTED
Klebsiella pneumoniae: NOT DETECTED
Listeria monocytogenes: NOT DETECTED
METHICILLIN RESISTANCE: DETECTED — AB
NEISSERIA MENINGITIDIS: NOT DETECTED
Proteus species: DETECTED — AB
Pseudomonas aeruginosa: NOT DETECTED
Serratia marcescens: NOT DETECTED
Staphylococcus aureus (BCID): NOT DETECTED
Staphylococcus species: DETECTED — AB
Streptococcus agalactiae: NOT DETECTED
Streptococcus pneumoniae: NOT DETECTED
Streptococcus pyogenes: NOT DETECTED
Streptococcus species: NOT DETECTED

## 2018-03-04 LAB — COMPREHENSIVE METABOLIC PANEL
ALT: 9 U/L (ref 0–44)
AST: 15 U/L (ref 15–41)
Albumin: 2.7 g/dL — ABNORMAL LOW (ref 3.5–5.0)
Alkaline Phosphatase: 61 U/L (ref 38–126)
Anion gap: 7 (ref 5–15)
BUN: 17 mg/dL (ref 8–23)
CALCIUM: 8.6 mg/dL — AB (ref 8.9–10.3)
CO2: 22 mmol/L (ref 22–32)
Chloride: 112 mmol/L — ABNORMAL HIGH (ref 98–111)
Creatinine, Ser: 2.52 mg/dL — ABNORMAL HIGH (ref 0.44–1.00)
GFR calc non Af Amer: 18 mL/min — ABNORMAL LOW (ref >60)
GFR, EST AFRICAN AMERICAN: 21 mL/min — AB (ref >60)
Glucose, Bld: 107 mg/dL — ABNORMAL HIGH (ref 70–99)
Potassium: 4.1 mmol/L (ref 3.5–5.1)
Sodium: 141 mmol/L (ref 135–145)
Total Bilirubin: 0.4 mg/dL (ref 0.3–1.2)
Total Protein: 4.6 g/dL — ABNORMAL LOW (ref 6.5–8.1)

## 2018-03-04 LAB — CBC
HCT: 24 % — ABNORMAL LOW (ref 36.0–46.0)
Hemoglobin: 7.5 g/dL — ABNORMAL LOW (ref 12.0–15.0)
MCH: 32.1 pg (ref 26.0–34.0)
MCHC: 31.3 g/dL (ref 30.0–36.0)
MCV: 102.6 fL — ABNORMAL HIGH (ref 80.0–100.0)
NRBC: 0 % (ref 0.0–0.2)
Platelets: 60 10*3/uL — ABNORMAL LOW (ref 150–400)
RBC: 2.34 MIL/uL — ABNORMAL LOW (ref 3.87–5.11)
RDW: 24.4 % — ABNORMAL HIGH (ref 11.5–15.5)
WBC: 1.7 10*3/uL — ABNORMAL LOW (ref 4.0–10.5)

## 2018-03-04 MED ORDER — VANCOMYCIN HCL 10 G IV SOLR
1250.0000 mg | INTRAVENOUS | Status: DC
  Administered 2018-03-06: 1250 mg via INTRAVENOUS
  Filled 2018-03-04: qty 1250

## 2018-03-04 MED ORDER — VANCOMYCIN HCL 10 G IV SOLR
1250.0000 mg | Freq: Once | INTRAVENOUS | Status: AC
  Administered 2018-03-04: 1250 mg via INTRAVENOUS
  Filled 2018-03-04: qty 1250

## 2018-03-04 MED ORDER — SODIUM CHLORIDE 0.9 % IV SOLN
2.0000 g | INTRAVENOUS | Status: DC
  Administered 2018-03-04 – 2018-03-06 (×3): 2 g via INTRAVENOUS
  Filled 2018-03-04 (×3): qty 20

## 2018-03-04 MED ORDER — ASPIRIN 325 MG PO TABS
325.0000 mg | ORAL_TABLET | Freq: Every day | ORAL | Status: DC
  Administered 2018-03-04 – 2018-03-11 (×8): 325 mg via ORAL
  Filled 2018-03-04 (×8): qty 1

## 2018-03-04 NOTE — Progress Notes (Addendum)
[facility] PROGRESS NOTE    Progress Note  Nancy Norris  GNF:[id number] DOB: [birth year] DOA: 03/03/2018 PCP: [name], MD     Brief Narrative:   Nancy Norris is an 78 y.o. female past medical history of multiple myeloma, COPD hypothyroidism presents with altered mental status for a few days, with difficulty in her speech  Assessment/Plan:   Acute Stroke [facility]): HgbA1c, fasting lipid panel  MRI, MRA of the brain without contrast MRI had to be canceled early but it did show multiple bilateral acute ischemic events in multiple vascular territories.  I am concerned about a hypercoagulable state in the setting of multiple myeloma check a hypercoagulable panel. No events on telemetry no history of A. fib. PT, OT, Speech consult  Carotid dopplers  Transthoracic Echo,   Continue aspirin. Atorvastatin 80  BP goal: permissive HTN upto 220/120 mmHg Telemetry monitoring  PanCytopenia/multiple myeloma: Currently on chemotherapy and steroids. Her pancytopenia is likely due to chemotherapy.  Her leukocytosis continues to worsen as well as her hemoglobin. If she becomes asymptomatic her hemoglobin drops below 7 will transfuse 1 unit of packed red blood cells.  Her platelet count is pending at the time of this dictation.  Depression: Continue Wellbutrin and Cymbalta.  COPD: Seems to be stable.  Hypotension:  Continue Midrodrine drain hold diuretic therapy  Chronic kidney disease stage IV: With a baseline creatinine in 3-2.5. To new to monitor.  Hypothyroidism: Continue Synthroid.  DVT prophylaxis:Scd Family Communication:none Disposition Plan/Barrier to D/C: unable to determine Code Status:     Code Status Orders  (From admission, onward)         Start     Ordered   03/03/18 1718  Do not attempt resuscitation (DNR)  Continuous    Question Answer Comment  In the event of cardiac or respiratory ARREST Do not call a "code blue"   In the event of cardiac  or respiratory ARREST Do not perform Intubation, CPR, defibrillation or ACLS   In the event of cardiac or respiratory ARREST Use medication by any route, position, wound care, and other measures to relive pain and suffering. May use oxygen, suction and manual treatment of airway obstruction as needed for comfort.      03/03/18 1717        Code Status History    Date Active Date Inactive Code Status Order ID Comments User Context   03/16/2017 0206 03/19/2017 2012 DNR [id number]  Merton Border, MD Inpatient   10/21/2015 1220 10/21/2015 1450 Full Code [id number]  Monia Sabal, PA-C HOV   12/03/2014 0212 12/07/2014 1711 Full Code [id number]  Allyne Gee, MD Inpatient   04/02/2014 2128 04/04/2014 1941 Full Code [id number]  Lavina Hamman, MD Inpatient   12/02/2013 0853 12/02/2013 2045 Full Code [id number]  Jonetta Osgood, MD Inpatient   01/13/2013 2051 01/15/2013 1348 DNR [id number]  Annita Brod, MD Inpatient   12/03/2012 1152 12/06/2012 1547 DNR [id number]  Bonnielee Haff, MD Inpatient   12/02/2012 1338 12/03/2012 1152 Full Code [id number]  Theodis Blaze, MD Inpatient    Advance Directive Documentation     Most Recent Value  Type of Advance Directive  Healthcare Power of Attorney, Living will  Pre-existing out of facility DNR order (yellow form or pink MOST form)  -  "MOST" Form in Place?  -        IV Access:    Peripheral IV   Procedures and diagnostic studies:   Dg Chest 2 View  Result  Date: 03/03/2018 CLINICAL DATA:  Weakness, dizziness and confusion over the last 2 weeks. History of renal cell cancer and myeloma. EXAM: CHEST - 2 VIEW COMPARISON:  02/26/2018 radiography.  CT 01/20/2018. FINDINGS: Heart size is normal. Chronic aortic atherosclerosis. Power port inserted from a right jugular approach has its tip at the SVC RA junction. The lungs are clear except for mild scarring. No sign of active infiltrate, mass, effusion or collapse. IMPRESSION: No active cardiopulmonary disease.  Electronically Signed   By: Nelson Chimes M.D.   On: 03/03/2018 12:58   Ct Head Wo Contrast  Result Date: 03/03/2018 CLINICAL DATA:  Confused. Altered level of consciousness. Breast cancer. EXAM: CT HEAD WITHOUT CONTRAST TECHNIQUE: Contiguous axial images were obtained from the base of the skull through the vertex without intravenous contrast. COMPARISON:  02/26/2018. FINDINGS: Brain: Two separate areas of ill-defined abnormal cortical and subcortical edema in the LEFT hemisphere, affecting the LEFT parietal and LEFT occipital lobes, could represent multifocal infarcts versus rapid progression of metastatic disease. These areas were previously unremarkable on CT 5 days earlier. MRI of the brain without and with contrast recommended for further evaluation. Elsewhere, mild to moderate atrophy. Hypoattenuation of white matter, consistent with small vessel disease/post treatment effect. Vascular: Calcification of the cavernous internal carotid arteries consistent with cerebrovascular atherosclerotic disease. No signs of intracranial large vessel occlusion. Skull: Calvarium intact. Sinuses/Orbits: No sinus disease. Negative orbits. Other: None. IMPRESSION: Two separate areas of abnormal cortical and subcortical edema in the LEFT hemisphere, affecting the LEFT parietal and LEFT occipital lobes, could represent multifocal infarcts versus rapid progression of metastatic disease. These were not present on the previous CT from 02/26/2018. MRI of the brain without and with contrast recommended for further evaluation. Electronically Signed   By: Staci Righter M.D.   On: 03/03/2018 13:26   Mr Brain Wo Contrast  Result Date: 03/03/2018 CLINICAL DATA:  Sudden onset confusion. EXAM: MRI HEAD WITHOUT CONTRAST TECHNIQUE: Multiplanar, multiecho pulse sequences of the brain and surrounding structures were obtained without intravenous contrast. COMPARISON:  Head CT 03/03/2018 FINDINGS: The examination had to be discontinued prior  to completion due to altered mental status and inability to follow the technologist's instructions. Coronal and axial diffusion-weighted imaging, sagittal T1-weighted imaging and axial T2-weighted imaging were obtained. There is multifocal abnormal diffusion restriction within both hemispheres, including the left frontal lobe, both parietal lobes, left occipital lobe and both cerebellar hemispheres. The largest area of diffusion abnormalities in the posterior left parietal lobe. There is no midline shift or other mass effect. The midline structures are normal. There is moderate white matter hyperintense T2-weighted signal consistent with chronic microvascular ischemia. IMPRESSION: 1. Examination discontinued early due to patient altered mental status. 2. Multifocal bilateral acute ischemia within multiple vascular territories in both hemispheres. This may indicate a central cardiac or aortic embolic source. Electronically Signed   By: Ulyses Jarred M.D.   On: 03/03/2018 15:04   [name] Head Wo Contrast  Result Date: 03/03/2018 CLINICAL DATA:  Stroke follow-up EXAM: MRA HEAD WITHOUT CONTRAST TECHNIQUE: Angiographic images of the Circle of Willis were obtained using MRA technique without intravenous contrast. COMPARISON:  Brain MRI 03/03/2018 FINDINGS: POSTERIOR CIRCULATION: --Basilar artery: Normal. --Posterior cerebral arteries: Normal. Both originate from the basilar artery. --Superior cerebellar arteries: Normal. --Inferior cerebellar arteries: Normal anterior and posterior inferior cerebellar arteries. ANTERIOR CIRCULATION: --Intracranial internal carotid arteries: Normal. --Anterior cerebral arteries: Normal. Both A1 segments are present. Patent anterior communicating artery. --Middle cerebral arteries: Normal. --Posterior communicating arteries: Absent  bilaterally. IMPRESSION: Normal intracranial MRA. Electronically Signed   By: Ulyses Jarred M.D.   On: 03/03/2018 22:51     Medical Consultants:     None.  Anti-Infectives:   None  Subjective:    Nancy Norris she remembers part of the reason why she came here, she denies any weakness in any part of her body.  Objective:    Vitals:   03/03/18 1859 03/03/18 1930 03/03/18 2012 03/04/18 0444  BP: (!) 145/77 137/76 129/63 121/65  Pulse: 82 81 81 84  Resp: '15  18 20  '$ Temp:   98.4 F (36.9 C) 98 F (36.7 C)  TempSrc:   Oral Axillary  SpO2: 100% 100% 98% 96%  Weight:   78.5 kg   Height:   '5\' 5"'$  (1.651 m)    No intake or output data in the 24 hours ending 03/04/18 0824 Filed Weights   03/03/18 2012  Weight: 78.5 kg    Exam: General exam: In no acute distress. Respiratory system: Good air movement and clear to auscultation. Cardiovascular system: S1 & S2 heard, RRR.  Gastrointestinal system: Abdomen is nondistended, soft and nontender.  Central nervous system: Alert and oriented. No focal neurological deficits. Extremities: No pedal edema. Skin: No rashes, lesions or ulcers Psychiatry: Mood and insight appear normal, she does relate that the family has been telling her that she was confused.   Data Reviewed:    Labs: Basic Metabolic Panel: Recent Labs  Lab 02/26/18 1154 02/26/18 1636 03/03/18 1243 03/04/18 0500  NA 143 140 140 141  K 4.0 3.5 3.5 4.1  CL 112* 111 110 112*  CO2 22 23 20* 22  GLUCOSE 109* 117* 105* 107*  BUN '19 18 20 17  '$ CREATININE 2.36* 2.29* 2.40* 2.52*  CALCIUM 8.1* 8.1* 8.3* 8.6*   GFR Estimated Creatinine Clearance: 19.7 mL/min (A) (by C-G formula based on SCr of 2.52 mg/dL (H)). Liver Function Tests: Recent Labs  Lab 02/26/18 1154 02/26/18 1636 03/03/18 1243 03/04/18 0500  AST 9* '15 16 15  '$ ALT '7 10 9 9  '$ ALKPHOS 83 74 83 61  BILITOT 0.9 0.7 1.1 0.4  PROT 5.4* 5.1* 5.6* 4.6*  ALBUMIN 3.0* 3.0* 3.5 2.7*   No results for input(s): LIPASE, AMYLASE in the last 168 hours. Recent Labs  Lab 03/03/18 1341  AMMONIA 10   Coagulation profile Recent Labs  Lab  03/03/18 1243  INR 0.90    CBC: Recent Labs  Lab 02/26/18 1154 02/26/18 1636 03/03/18 1243 03/04/18 0500  WBC 3.3* 3.0* 2.4* 1.7*  NEUTROABS 1.6* 1.3* 1.1*  --   HGB 8.6* 7.9* 8.3* 7.5*  HCT 26.6* 25.3* 26.8* 24.0*  MCV 98.2 100.4* 104.7* 102.6*  PLT 28* 27* 54* PENDING   Cardiac Enzymes: Recent Labs  Lab 02/26/18 1636  CKTOTAL 10*   BNP (last 3 results) No results for input(s): PROBNP in the last 8760 hours. CBG: No results for input(s): GLUCAP in the last 168 hours. D-Dimer: No results for input(s): DDIMER in the last 72 hours. Hgb A1c: No results for input(s): HGBA1C in the last 72 hours. Lipid Profile: No results for input(s): CHOL, HDL, LDLCALC, TRIG, CHOLHDL, LDLDIRECT in the last 72 hours. Thyroid function studies: No results for input(s): TSH, T4TOTAL, T3FREE, THYROIDAB in the last 72 hours.  Invalid input(s): FREET3 Anemia work up: No results for input(s): VITAMINB12, FOLATE, FERRITIN, TIBC, IRON, RETICCTPCT in the last 72 hours. Sepsis Labs: Recent Labs  Lab 02/26/18 1154 02/26/18 1636 03/03/18 1234 03/03/18  1243 03/04/18 0500  WBC 3.3* 3.0*  --  2.4* 1.7*  LATICACIDVEN  --   --  1.2  --   --    Microbiology Recent Results (from the past 240 hour(s))  Urine culture     Status: Abnormal   Collection Time: 02/26/18  4:55 PM  Result Value Ref Range Status   Specimen Description   Final    URINE, RANDOM Performed at [facility], [place] [address], [place], [place] [address]    Special Requests   Final    NONE Performed at [facility] [place] [address] [place], [place] [address]    Culture >=100,000 COLONIES/mL ESCHERICHIA COLI (A)  Final   Report Status 03/01/2018 FINAL  Final   Organism ID, Bacteria ESCHERICHIA COLI (A)  Final      Susceptibility   Escherichia coli - MIC*    AMPICILLIN >=32 RESISTANT Resistant     CEFAZOLIN 8 SENSITIVE Sensitive     CEFTRIAXONE <=1 SENSITIVE Sensitive     CIPROFLOXACIN  <=0.25 SENSITIVE Sensitive     GENTAMICIN <=1 SENSITIVE Sensitive     IMIPENEM <=0.25 SENSITIVE Sensitive     NITROFURANTOIN <=16 SENSITIVE Sensitive     TRIMETH/SULFA <=20 SENSITIVE Sensitive     AMPICILLIN/SULBACTAM >=32 RESISTANT Resistant     PIP/TAZO <=4 SENSITIVE Sensitive     Extended ESBL NEGATIVE Sensitive     * >=100,000 COLONIES/mL ESCHERICHIA COLI  Culture, blood (Routine x 2)     Status: None (Preliminary result)   Collection Time: 03/03/18  1:41 PM  Result Value Ref Range Status   Specimen Description   Final    BLOOD RIGHT ANTECUBITAL Performed at [place] [address] [place], [place] [address]    Special Requests   Final    BOTTLES DRAWN AEROBIC AND ANAEROBIC Blood Culture adequate volume Performed at [facility] [address] [place], [place] [address]    Culture PENDING  Incomplete   Report Status PENDING  Incomplete     Medications:   .  stroke: mapping our early stages of recovery book   Does not apply Once  . buPROPion  75 mg Oral BID  . citalopram  40 mg Oral Daily  . clotrimazole  1 Applicatorful Vaginal QHS  . famotidine  20 mg Oral Daily  . folic acid  1 mg Oral Daily  . gabapentin  600 mg Oral QHS  . levothyroxine  150 mcg Oral Q0600  . loratadine  10 mg Oral Daily  . midodrine  10 mg Oral Daily  . montelukast  10 mg Oral QHS  . pilocarpine  5 mg Oral BID  . sodium chloride flush  3 mL Intravenous Once  . sucralfate  1 g Oral TID WC & HS  . vitamin B-12  1,000 mcg Oral Daily   Continuous Infusions: . lactated ringers 75 mL/hr at 03/03/18 2322      LOS: 1 day   Charlynne Cousins  [facility]  03/04/2018, 8:24 AM

## 2018-03-04 NOTE — Progress Notes (Signed)
Occupational Therapy Evaluation Patient Details Name: Nancy Norris MRN: [id number] DOB: October 09, 1940 Today's Date: 03/04/2018    History of Present Illness Patient is a 78 y/o female presents with confusion, amnesia and HA. Head CT- left occipital and left frontal parietial infarcts. MRI- multiple posterior/anterior bilateral infarcts. PMH includes multiple myeloma currently getting chemo, MI, COPD, hypotension, breast ca, fibromyalgia.    Clinical Impression   PTA, pt lived alone and was independent with ADL and mobility. Pt used [employer] for appointments and the [place] drivers for Chemo treatments. Pt enjoys crafts. Pt demonstrates significant visual, cognitive and visual perceptual deficits as noted below and requires min A with mobility and Mod A with ADL. Pt requires Max A with IADL tasks. Pt will benefit from rehab at CIR to maximize functional level of independence. Will follow acutely to address established goals.     Follow Up Recommendations  CIR;Supervision/Assistance - 24 hour    Equipment Recommendations  3 in 1 bedside commode    Recommendations for Other Services Rehab consult     Precautions / Restrictions Precautions Precautions: Fall Precaution Comments: R field cut Restrictions Weight Bearing Restrictions: No      Mobility Bed Mobility Overal bed mobility: Needs Assistance Bed Mobility: Supine to Sit     Supine to sit: Supervision;HOB elevated     General bed mobility comments: No assist needed.   Transfers Overall transfer level: Needs assistance Equipment used: None Transfers: Sit to/from Stand Sit to Stand: Min assist         General transfer comment: min A from toilet    Balance Overall balance assessment: Needs assistance Sitting-balance support: Feet supported;No upper extremity supported Sitting balance-Leahy Scale: Fair     Standing balance support: During functional activity Standing balance-Leahy Scale: Poor Standing balance  comment: LOB when navigating around obstacles                           ADL either performed or assessed with clinical judgement   ADL Overall ADL's : Needs assistance/impaired Eating/Feeding: Minimal assistance Eating/Feeding Details (indicate cue type and reason): a to locate food Grooming: Minimal assistance;Standing Grooming Details (indicate cue type and reason): sequencing adn locating itmes Upper Body Bathing: Minimal assistance;Sitting   Lower Body Bathing: Sit to/from stand;Minimal assistance   Upper Body Dressing : Moderate assistance;Sitting   Lower Body Dressing: Minimal assistance;Sit to/from stand   Toilet Transfer: Minimal assistance;Ambulation   Toileting- Clothing Manipulation and Hygiene: Minimal assistance;Sit to/from stand       Functional mobility during ADLs: Minimal assistance;Cueing for safety;Cueing for sequencing General ADL Comments: Running into bed on the way back from the bathroom. Unable to problem solve how to move around the bed to the Left. Unable to locate remote in lower visual quadrant. Unable to follow directions to scan R to find remote     Vision Baseline Vision/History: Wears glasses Wears Glasses: At all times Patient Visual Report: Blurring of vision("distorted") Vision Assessment?: Yes Eye Alignment: Within Functional Limits Ocular Range of Motion: Within Functional Limits Alignment/Gaze Preference: Within Defined Limits Tracking/Visual Pursuits: Decreased smoothness of horizontal tracking;Decreased smoothness of vertical tracking Saccades: Additional head turns occurred during testing;Additional eye shifts occurred during testing Convergence: Within functional limits Visual Fields: Right visual field deficit(appears lower quadrant) Depth Perception: Overshoots     Perception Perception Perception Tested?: Yes Perception Deficits: Figure ground;Spatial orientation Spatial deficits: R/L discrimination deficits;  difficulty with reading; unable to "see" right side of menu;  omitting several words Comments: Difficulty using cell phone   Praxis Praxis Praxis tested?: Deficits Deficits: Organization    Pertinent Vitals/Pain Pain Assessment: 0-10 Pain Score: 10-Worst pain ever Pain Location: head ache Pain Descriptors / Indicators: Headache Pain Intervention(s): Limited activity within patient's tolerance;Patient requesting pain meds-RN notified     Hand Dominance Right   Extremity/Trunk Assessment Upper Extremity Assessment Upper Extremity Assessment: LUE deficits/detail LUE Deficits / Details: history of L shoulder problems; seeing ortho for shoulder   Lower Extremity Assessment Lower Extremity Assessment: Defer to PT evaluation RLE Sensation: (distal to knee and into foot) LLE Sensation: (distal to knee and into foot)   Cervical / Trunk Assessment Cervical / Trunk Assessment: Normal   Communication Communication Communication: Expressive difficulties   Cognition Arousal/Alertness: Awake/alert Behavior During Therapy: Flat affect Overall Cognitive Status: Impaired/Different from baseline Area of Impairment: Memory;Awareness;Safety/judgement;Problem solving;Orientation;Attention                 Orientation Level: Disoriented to;Time Current Attention Level: Selective Memory: Decreased short-term memory;Decreased recall of precautions   Safety/Judgement: Decreased awareness of deficits;Decreased awareness of safety Awareness: Emergent Problem Solving: Slow processing;Requires verbal cues;Difficulty sequencing General Comments: Difficulty with sequencing and problem solving. When washing hands, required cues to find soap. Pt then used a paper towel and required vc to find the sink (on her right) and use water to wash hands before drying with paper towel.Difficulty with reading Unable to use contextual cues to identify mistakes   General Comments       Exercises Exercises:  Other exercises Other Exercises Other Exercises: visual scanning toward right with use of Anchor in R field   Shoulder Instructions      Home Living Family/patient expects to be discharged to:: Private residence Living Arrangements: Alone Available Help at Discharge: Neighbor;Available PRN/intermittently;Family Type of Home: Apartment Home Access: Ramped entrance     Home Layout: One level     Bathroom Shower/Tub: Teacher, early years/pre: Standard Bathroom Accessibility: Yes How Accessible: Accessible via walker Home Equipment: [place] - 2 wheels;Cane - single point;Shower seat;Walker - 4 wheels      Lives With: Alone    Prior Functioning/Environment Level of Independence: Independent        Comments: Rides [employer] for transportation; walks without DME. Does cooking/cleaning. ; [facility] center provides rides; yeasr of Chemo left; enjoys "diamond" painting; enjoys crafts; does her own Patent attorney        OT Problem List: Decreased activity tolerance;Impaired balance (sitting and/or standing);Impaired vision/perception;Decreased coordination;Decreased cognition;Decreased safety awareness;Decreased knowledge of use of DME or AE;Pain      OT Treatment/Interventions: Self-care/ADL training;Therapeutic exercise;DME and/or AE instruction;Therapeutic activities;Cognitive remediation/compensation;Visual/perceptual remediation/compensation;Patient/family education;Balance training    OT Goals(Current goals can be found in the care plan section) Acute Rehab OT Goals Patient Stated Goal: to return to independence OT Goal Formulation: With patient Time For Goal Achievement: 03/18/18 Potential to Achieve Goals: Good  OT Frequency: Min 2X/week   Barriers to D/C:            Co-evaluation              AM-PAC OT "6 Clicks" Daily Activity     Outcome Measure Help from another person eating meals?: A Little Help from another person taking care of  personal grooming?: A Little Help from another person toileting, which includes using toliet, bedpan, or urinal?: A Little Help from another person bathing (including washing, rinsing, drying)?: A Little Help from another person to put  on and taking off regular upper body clothing?: A Lot Help from another person to put on and taking off regular lower body clothing?: A Little 6 Click Score: 17   End of Session Equipment Utilized During Treatment: Gait belt Nurse Communication: Mobility status;Patient requests pain meds  Activity Tolerance: Patient tolerated treatment well Patient left: in bed;with call bell/phone within reach;with bed alarm set  OT Visit Diagnosis: Unsteadiness on feet (R26.81);Low vision, both eyes (H54.2);Other symptoms and signs involving cognitive function;Dizziness and giddiness (R42);Pain Pain - part of body: (head)                Time: 1410-1450 OT Time Calculation (min): 40 min Charges:  OT General Charges $OT Visit: 1 Visit OT Evaluation $OT Eval Moderate Complexity: 1 Mod OT Treatments $Self Care/Home Management : 23-37 mins  Maurie Boettcher, OT/L   Acute OT Clinical Specialist [place] Pager [phone] Office [phone]   [facility] 03/04/2018, 3:12 PM

## 2018-03-04 NOTE — Progress Notes (Signed)
PHARMACY - PHYSICIAN COMMUNICATION CRITICAL VALUE ALERT - BLOOD CULTURE IDENTIFICATION (BCID)  Nancy Norris is an 78 y.o. female who presented to [facility] on 03/03/2018 with a chief complaint of AMS  Assessment:  Pt with multiple myeloma who was admitted for AM. Lab called with BCID result tonight of staph species and proteus in 1/4 bottles. Staph could be a contaminant however, she does have a porta cath in. Therefore, it would increase her risk of a true infection. D/w Dr. Aileen Fass, we will put her on vanc + ceftriaxone.   Name of physician (or Provider) Contacted: Dr Aileen Fass  Current antibiotics: None  Changes to prescribed antibiotics recommended:  Start vanc and ceftriaxone  Results for orders placed or performed during the hospital encounter of 03/03/18  Blood Culture ID Panel (Reflexed) (Collected: 03/03/2018  1:41 PM)  Result Value Ref Range   Enterococcus species NOT DETECTED NOT DETECTED   Listeria monocytogenes NOT DETECTED NOT DETECTED   Staphylococcus species DETECTED (A) NOT DETECTED   Staphylococcus aureus (BCID) NOT DETECTED NOT DETECTED   Methicillin resistance DETECTED (A) NOT DETECTED   Streptococcus species NOT DETECTED NOT DETECTED   Streptococcus agalactiae NOT DETECTED NOT DETECTED   Streptococcus pneumoniae NOT DETECTED NOT DETECTED   Streptococcus pyogenes NOT DETECTED NOT DETECTED   Acinetobacter baumannii NOT DETECTED NOT DETECTED   Enterobacteriaceae species DETECTED (A) NOT DETECTED   Enterobacter cloacae complex NOT DETECTED NOT DETECTED   Escherichia coli NOT DETECTED NOT DETECTED   Klebsiella oxytoca NOT DETECTED NOT DETECTED   Klebsiella pneumoniae NOT DETECTED NOT DETECTED   Proteus species DETECTED (A) NOT DETECTED   Serratia marcescens NOT DETECTED NOT DETECTED   Carbapenem resistance NOT DETECTED NOT DETECTED   Haemophilus influenzae NOT DETECTED NOT DETECTED   Neisseria meningitidis NOT DETECTED NOT DETECTED   Pseudomonas  aeruginosa NOT DETECTED NOT DETECTED   Candida albicans NOT DETECTED NOT DETECTED   Candida glabrata NOT DETECTED NOT DETECTED   Candida krusei NOT DETECTED NOT DETECTED   Candida parapsilosis NOT DETECTED NOT DETECTED   Candida tropicalis NOT DETECTED NOT DETECTED   Onnie Boer, PharmD, BCIDP, AAHIVP, CPP Infectious Disease Pharmacist 03/04/2018 7:04 PM

## 2018-03-04 NOTE — Telephone Encounter (Signed)
Faxed note that pt admitted.

## 2018-03-04 NOTE — Evaluation (Signed)
Speech Language Pathology Evaluation Patient Details Name: Nancy Norris MRN: [id number] DOB: [birth year] Today's Date: 03/04/2018 Time: 0272-5366 SLP Time Calculation (min) (ACUTE ONLY): 20 min  Problem List:  Patient Active Problem List   Diagnosis Date Noted  . Stroke ([place]) 03/03/2018  . Port catheter in place 09/18/2016  . Cryptococcal pneumonitis ([place]) 11/22/2015  . Anemia in neoplastic disease 03/31/2015  . B12 deficiency 12/04/2014  . Thrombocytopenia ([place]) 12/04/2014  . Encounter for antineoplastic chemotherapy 06/21/2014  . CKD (chronic kidney disease) stage 5, GFR less than 15 ml/min (HCC) 04/02/2014  . Multiple myeloma ([place]) 12/02/2013  . Major depressive disorder, recurrent episode ([place]) 11/02/2013  . Medication management 09/30/2013  . Vitamin D deficiency 03/24/2013  . Mixed hyperlipidemia 02/24/2013  . COPD (chronic obstructive pulmonary disease) with emphysema ([place]) 02/20/2013  . GERD (gastroesophageal reflux disease) 01/20/2013  . HTN (hypertension) 01/14/2013  . Asthma, chronic 01/13/2013  . Hypothyroid 01/13/2013  . Chronic diastolic heart failure ([place]) 01/13/2013  . HX: breast cancer 12/08/2012  . Hypercalcemia 12/02/2012   Past Medical History:  Past Medical History:  Diagnosis Date  . Anemia   . Anginal pain ([place])    used NTG x 2 May 31 and 06/15/13   . Anxiety   . Arthritis   . B12 deficiency 12/04/2014  . Breast cancer ([place])   . Complication of anesthesia   . COPD (chronic obstructive pulmonary disease) ([place])   . Cryptococcal pneumonitis ([place]) 11/22/2015  . Depression   . Dizziness   . Dyspnea   . Fibromyalgia   . Fibromyalgia   . GERD (gastroesophageal reflux disease)   . Headache(784.0)   . Heart murmur   . Hemoptysis 10/21/2015  . History of blood transfusion    last one May 12   . Hx of cardiovascular stress test    LexiScan with low level exercise Myoview (02/2013): No ischemia, EF 72%; normal study  . Hx of echocardiogram    a.   Echocardiogram (12/26/2012): EF 44-03%, grade 1 diastolic dysfunction;   b.  Echocardiogram (02/2013): EF 55-60%, no WMA, trivial effusion  . Hyperkalemia   . Hyponatremia   . Hypotension   . Hypothyroidism   . Mucositis   . Multiple myeloma   . Myocardial infarction [facility])    in past, patient was unaware.   . Neuropathy   . Nodule of left lung 09/13/2015  . Pain in joint, pelvic region and thigh 07/07/2015  . Pneumonia    several  . PONV (postoperative nausea and vomiting) 2008   after mastestomy   Past Surgical History:  Past Surgical History:  Procedure Laterality Date  . ABDOMINAL HYSTERECTOMY  1981  . AV FISTULA PLACEMENT Left 06/19/2013   Procedure: CREATION OF LEFT ARM ARTERIOVENOUS (AV) FISTULA ;  Surgeon: Angelia Mould, MD;  Location: [facility];  Service: Vascular;  Laterality: Left;  . BREAST RECONSTRUCTION    . BREAST SURGERY Right    reduction  . CATARACT EXTRACTION, BILATERAL    . CHOLECYSTECTOMY  1971  . COLONOSCOPY    . EYE SURGERY Bilateral    lens implant  . history of Port removal    . LUNG BIOPSY  10/21/2015  . MASTECTOMY Left 2008  . PORTACATH PLACEMENT  12/2012   has had 2  . RESECTION OF ARTERIOVENOUS FISTULA ANEURYSM Left 06/04/2017   Procedure: LIGATION ANEURYSM OF LEFT ARTERIOVENOUS FISTULA;  Surgeon: Angelia Mould, MD;  Location: [facility];  Service: Vascular;  Laterality: Left;  . Status  post stem cell transplant on September 28, 2008.     HPI:  Patient is a 78 y/o female presents with confusion, amnesia and HA. Head CT- left occipital and left frontal parietial infarcts. MRI- multiple posterior/anterior bilateral infarcts. PMH includes multiple myeloma currently getting chemo, MI, COPD, hypotension, breast ca, fibromyalgia.    Assessment / Plan / Recommendation Clinical Impression  Pt presents with impaired cognitive linguistic abilities. Despite pt's report of word fining deficits, pt was able to perform confrontational, responsive and  divergent naming tasks. Within general conversation, pt able to use expressive language appropriately. [name] prosody was mildly impaired d/t pt effort to "use the right word." After pt demonstrated appropriate expressive abilities, SLP attempted to administer [place]. Pt with inability to perform first task and became very discouraged with inability. Pt with in ability to visually look at letters and numbers as well as problem solve alternating pattern. Pt with no awareness of errors bu once aware, she was very discouraged. Support provided. Reommend CIR level of therapies to increase functional independence. Nursing needed to darw blood so further testing will be performed at next available opportunity. ST to follow.     SLP Assessment  SLP Recommendation/Assessment: Patient needs continued Speech Lanaguage Pathology Services SLP Visit Diagnosis: Cognitive communication deficit (R41.841)    Follow Up Recommendations  Inpatient Rehab    Frequency and Duration min 2x/week  2 weeks      SLP Evaluation Cognition  Overall Cognitive Status: Impaired/Different from baseline Arousal/Alertness: Awake/alert Orientation Level: Oriented X4 Attention: Selective Selective Attention: Impaired Selective Attention Impairment: Verbal complex;Functional complex Memory: Impaired Memory Impairment: Decreased recall of new information;Decreased short term memory Decreased Short Term Memory: Verbal basic;Functional basic Awareness: Impaired Awareness Impairment: Emergent impairment Problem Solving: Impaired Problem Solving Impairment: Verbal complex;Functional complex Safety/Judgment: Impaired       Comprehension  Auditory Comprehension Overall Auditory Comprehension: Appears within functional limits for tasks assessed Visual Recognition/Discrimination Discrimination: Not tested(appears to have some visual perceptual deficits) Reading Comprehension Reading Status: Not tested    Expression  Expression Primary Mode of Expression: Verbal Verbal Expression Overall Verbal Expression: Impaired Initiation: No impairment Automatic Speech: Name;Social Response;Counting;Day of week;Month of year Level of Generative/Spontaneous Verbalization: Conversation Repetition: No impairment Naming: No impairment Pragmatics: No impairment Interfering Components: (deliberate attempts to "use right words" leaves speech flat) Non-Verbal Means of Communication: Not applicable Written Expression Dominant Hand: Right Written Expression: Exceptions to WFL(d/t visual perceptual deficits)   Oral / Motor  Oral Motor/Sensory Function Overall Oral Motor/Sensory Function: Within functional limits Motor Speech Overall Motor Speech: Appears within functional limits for tasks assessed Respiration: Within functional limits Phonation: Normal Resonance: Within functional limits Articulation: Within functional limitis Intelligibility: Intelligible Motor Planning: Witnin functional limits Motor Speech Errors: Not applicable   GO                    Aruna Nestler 03/04/2018, 11:42 AM

## 2018-03-04 NOTE — Evaluation (Signed)
Physical Therapy Evaluation Patient Details Name: Nancy Norris MRN: [id number] DOB: February 21, 1940 Today's Date: 03/04/2018   History of Present Illness  Patient is a 78 y/o female presents with confusion, amnesia and HA. Head CT- left occipital and left frontal parietial infarcts. MRI- multiple posterior/anterior bilateral infarcts. PMH includes multiple myeloma currently getting chemo, MI, COPD, hypotension, breast ca, fibromyalgia.   Clinical Impression  Patient presents with visual deficits, expressive difficulties, balance deficits, impaired memory, poor safety awareness/awareness of deficits, weakness and impaired mobility s/p above. Pt independent PTA and uses [employer] for transportation. Reports word finding difficulties for a few weeks now. Tolerated gait training with Min-mod A for balance/safety due to running into obstacles in hallway due to visual deficits and imbalance. Also with 2/4 DOE. Education re: BeFAST and compensatory strategies regarding vision. Encouraged pt to bring bifocals. Pt with 0/3 recall within session and not able to tell time correctly- cognition vs vision? Has supportive daughter and neighbor. Would benefit from intensive therapies to maximize independence and mobility prior to return home.  I have discussed the patient's current level of function related to stroke with the patient.  They acknowledge understanding of this and do not feel they would be able to have their care needs met at home.  They are interested in post-acute rehab in an inpatient setting. Will follow acutely.     Follow Up Recommendations CIR;Supervision for mobility/OOB    Equipment Recommendations  Other (comment)(defer)    Recommendations for Other Services       Precautions / Restrictions Precautions Precautions: Fall Restrictions Weight Bearing Restrictions: No      Mobility  Bed Mobility Overal bed mobility: Needs Assistance Bed Mobility: Supine to Sit     Supine to sit:  Supervision;HOB elevated     General bed mobility comments: No assist needed.   Transfers Overall transfer level: Needs assistance Equipment used: None Transfers: Sit to/from Stand Sit to Stand: Min guard         General transfer comment: Close min guard for safety. Stood from [employer], Min A to steady in standing for balance.   Ambulation/Gait Ambulation/Gait assistance: Min assist Gait Distance (Feet): 75 Feet Assistive device: None Gait Pattern/deviations: Step-through pattern;Decreased stride length;Staggering left;Narrow base of support;Staggering right Gait velocity: decreased Gait velocity interpretation: <1.8 ft/sec, indicate of risk for recurrent falls General Gait Details: Slow, unsteady and guarded gait with right knee instability and veering to left. LOB x2 requiring external support. Reaching for rail at times. Pt ran into obstacles in front of her, needed cues to navigate. 2/4 DOE. VSS.   Stairs            Wheelchair Mobility    Modified Rankin (Stroke Patients Only) Modified Rankin (Stroke Patients Only) Pre-Morbid Rankin Score: Slight disability Modified Rankin: Moderately severe disability     Balance Overall balance assessment: Needs assistance Sitting-balance support: Feet supported;No upper extremity supported Sitting balance-Leahy Scale: Fair     Standing balance support: During functional activity Standing balance-Leahy Scale: Fair Standing balance comment: Able to stand statically with close min guard, requires assist for dynamic tasks.                             Pertinent Vitals/Pain Pain Assessment: No/denies pain    Home Living Family/patient expects to be discharged to:: Private residence Living Arrangements: Alone Available Help at Discharge: Neighbor;Available PRN/intermittently;Family(Sheila is her neighbor and daughter, [name]) Type of Home: Apartment Home Access:  Ramped entrance     Home Layout: One level Home  Equipment: Miner - 2 wheels;Cane - single point;Shower seat      Prior Function Level of Independence: Independent         Comments: Rides [employer] for transportation; walks without DME. Does cooking/cleaning.      Hand Dominance   Dominant Hand: Right    Extremity/Trunk Assessment   Upper Extremity Assessment Upper Extremity Assessment: Defer to OT evaluation    Lower Extremity Assessment Lower Extremity Assessment: RLE deficits/detail;LLE deficits/detail RLE Deficits / Details: Grossly ~4/5 throughout, worse functionally RLE Sensation: history of peripheral neuropathy(distal to knee and into foot) LLE Deficits / Details: Grossly ~4+/5 throughout LLE Sensation: history of peripheral neuropathy(distal to knee and into foot)       Communication   Communication: Expressive difficulties  Cognition Arousal/Alertness: Awake/alert Behavior During Therapy: WFL for tasks assessed/performed Overall Cognitive Status: Impaired/Different from baseline Area of Impairment: Memory;Awareness;Safety/judgement;Problem solving                     Memory: Decreased short-term memory;Decreased recall of precautions   Safety/Judgement: Decreased awareness of deficits;Decreased awareness of safety Awareness: Intellectual Problem Solving: Slow processing;Requires verbal cues General Comments: A&Ox4 but difficulty getting date out with increased time. 0/3 recall within session, able to get with contextual cues. Word finding difficulties which pt reports have been going on for some time.       General Comments General comments (skin integrity, edema, etc.): Difficulty seeing in right lower quadrant and lateral quadrant for vision. Difficulty telling time on the clock. Reports she wears bifocals but not present in room.     Exercises     Assessment/Plan    PT Assessment Patient needs continued PT services  PT Problem List Decreased strength;Decreased mobility;Decreased safety  awareness;Decreased knowledge of precautions;Impaired sensation;Decreased balance;Decreased knowledge of use of DME;Cardiopulmonary status limiting activity;Decreased cognition;Decreased activity tolerance       PT Treatment Interventions Functional mobility training;Patient/family education;Balance training;DME instruction;Gait training;Therapeutic activities;Neuromuscular re-education;Stair training;Therapeutic exercise;Cognitive remediation    PT Goals (Current goals can be found in the Care Plan section)  Acute Rehab PT Goals Patient Stated Goal: to return to independence PT Goal Formulation: With patient Time For Goal Achievement: 03/18/18 Potential to Achieve Goals: Good    Frequency Min 4X/week   Barriers to discharge Decreased caregiver support lives alone    Co-evaluation               AM-PAC PT "6 Clicks" Mobility  Outcome Measure Help needed turning from your back to your side while in a flat bed without using bedrails?: A Little Help needed moving from lying on your back to sitting on the side of a flat bed without using bedrails?: A Little Help needed moving to and from a bed to a chair (including a wheelchair)?: A Little Help needed standing up from a chair using your arms (e.g., wheelchair or bedside chair)?: A Little Help needed to walk in hospital room?: A Little Help needed climbing 3-5 steps with a railing? : A Lot 6 Click Score: 17    End of Session Equipment Utilized During Treatment: Gait belt Activity Tolerance: Patient tolerated treatment well Patient left: in bed;with call bell/phone within reach;with bed alarm set(sitting EOB eating breakfast) Nurse Communication: Mobility status PT Visit Diagnosis: Difficulty in walking, not elsewhere classified (R26.2);Unsteadiness on feet (R26.81)    Time: 0762-2633 PT Time Calculation (min) (ACUTE ONLY): 26 min   Charges:   PT Evaluation $  PT Eval Moderate Complexity: 1 Mod PT Treatments $Gait Training:  8-22 mins        Wray Kearns, [place], DPT Acute Rehabilitation Services Pager [phone] Office [place] 03/04/2018, 8:18 AM

## 2018-03-04 NOTE — Progress Notes (Addendum)
STROKE TEAM PROGRESS NOTE   INTERVAL HISTORY No family is at the bedside.  She recounted HPI with Dr. Leonie Man.   Vitals:   03/03/18 2012 03/04/18 0444 03/04/18 1012 03/04/18 1140  BP: 129/63 121/65 122/74 116/68  Pulse: 81 84  77  Resp: _0 Temp: 98.4 F (36.9 C) 98 F (36.7 C) 98.2 F (36.8 C) 99.1 F (37.3 C)  TempSrc: Oral Axillary Axillary Oral  SpO2: 98% 96%  99%  Weight: 78.5 kg     Height: _1  (1.651 m)       CBC:  Recent Labs  Lab 02/26/18 1636 03/03/18 1243 03/04/18 0500  WBC 3.0* 2.4* 1.7*  NEUTROABS 1.3* 1.1*  --   HGB 7.9* 8.3* 7.5*  HCT 25.3* 26.8* 24.0*  MCV 100.4* 104.7* 102.6*  PLT 27* 54* 60*    Basic Metabolic Panel:  Recent Labs  Lab 03/03/18 1243 03/04/18 0500  NA 140 141  K 3.5 4.1  CL 110 112*  CO2 20* 22  GLUCOSE 105* 107*  BUN 20 17  CREATININE 2.40* 2.52*  CALCIUM 8.3* 8.6*   Lipid Panel:     Component Value Date/Time   CHOL 199 12/18/2017 1137   TRIG 221 (H) 12/18/2017 1137   HDL 35 (L) 12/18/2017 1137   CHOLHDL 5.7 (H) 12/18/2017 1137   VLDL 34 (H) 05/30/2016 1152   LDLCALC 128 (H) 12/18/2017 1137   HgbA1c:  Lab Results  Component Value Date   HGBA1C 5.1 09/13/2017   Urine Drug Screen: No results found for: LABOPIA, COCAINSCRNUR, LABBENZ, AMPHETMU, THCU, LABBARB  Alcohol Level     Component Value Date/Time   ETH <10 03/03/2018 1244    IMAGING Dg Chest 2 View  Result Date: 03/03/2018 CLINICAL DATA:  Weakness, dizziness and confusion over the last 2 weeks. History of renal cell cancer and myeloma. EXAM: CHEST - 2 VIEW COMPARISON:  02/26/2018 radiography.  CT 01/20/2018. FINDINGS: Heart size is normal. Chronic aortic atherosclerosis. Power port inserted from a right jugular approach has its tip at the SVC RA junction. The lungs are clear except for mild scarring. No sign of active infiltrate, mass, effusion or collapse. IMPRESSION: No active cardiopulmonary disease. Electronically Signed   By: Nelson Chimes M.D.    On: 03/03/2018 12:58   Ct Head Wo Contrast  Result Date: 03/03/2018 CLINICAL DATA:  Confused. Altered level of consciousness. Breast cancer. EXAM: CT HEAD WITHOUT CONTRAST TECHNIQUE: Contiguous axial images were obtained from the base of the skull through the vertex without intravenous contrast. COMPARISON:  02/26/2018. FINDINGS: Brain: Two separate areas of ill-defined abnormal cortical and subcortical edema in the LEFT hemisphere, affecting the LEFT parietal and LEFT occipital lobes, could represent multifocal infarcts versus rapid progression of metastatic disease. These areas were previously unremarkable on CT 5 days earlier. MRI of the brain without and with contrast recommended for further evaluation. Elsewhere, mild to moderate atrophy. Hypoattenuation of white matter, consistent with small vessel disease/post treatment effect. Vascular: Calcification of the cavernous internal carotid arteries consistent with cerebrovascular atherosclerotic disease. No signs of intracranial large vessel occlusion. Skull: Calvarium intact. Sinuses/Orbits: No sinus disease. Negative orbits. Other: None. IMPRESSION: Two separate areas of abnormal cortical and subcortical edema in the LEFT hemisphere, affecting the LEFT parietal and LEFT occipital lobes, could represent multifocal infarcts versus rapid progression of metastatic disease. These were not present on the previous CT from 02/26/2018. MRI of the brain without and with contrast recommended for further evaluation. Electronically Signed  By: Staci Righter M.D.   On: 03/03/2018 13:26   Mr Brain Wo Contrast  Result Date: 03/03/2018 CLINICAL DATA:  Sudden onset confusion. EXAM: MRI HEAD WITHOUT CONTRAST TECHNIQUE: Multiplanar, multiecho pulse sequences of the brain and surrounding structures were obtained without intravenous contrast. COMPARISON:  Head CT 03/03/2018 FINDINGS: The examination had to be discontinued prior to completion due to altered mental status and  inability to follow the technologist's instructions. Coronal and axial diffusion-weighted imaging, sagittal T1-weighted imaging and axial T2-weighted imaging were obtained. There is multifocal abnormal diffusion restriction within both hemispheres, including the left frontal lobe, both parietal lobes, left occipital lobe and both cerebellar hemispheres. The largest area of diffusion abnormalities in the posterior left parietal lobe. There is no midline shift or other mass effect. The midline structures are normal. There is moderate white matter hyperintense T2-weighted signal consistent with chronic microvascular ischemia. IMPRESSION: 1. Examination discontinued early due to patient altered mental status. 2. Multifocal bilateral acute ischemia within multiple vascular territories in both hemispheres. This may indicate a central cardiac or aortic embolic source. Electronically Signed   By: Ulyses Jarred M.D.   On: 03/03/2018 15:04   [name] Head Wo Contrast  Result Date: 03/03/2018 CLINICAL DATA:  Stroke follow-up EXAM: MRA HEAD WITHOUT CONTRAST TECHNIQUE: Angiographic images of the Circle of Willis were obtained using MRA technique without intravenous contrast. COMPARISON:  Brain MRI 03/03/2018 FINDINGS: POSTERIOR CIRCULATION: --Basilar artery: Normal. --Posterior cerebral arteries: Normal. Both originate from the basilar artery. --Superior cerebellar arteries: Normal. --Inferior cerebellar arteries: Normal anterior and posterior inferior cerebellar arteries. ANTERIOR CIRCULATION: --Intracranial internal carotid arteries: Normal. --Anterior cerebral arteries: Normal. Both A1 segments are present. Patent anterior communicating artery. --Middle cerebral arteries: Normal. --Posterior communicating arteries: Absent bilaterally. IMPRESSION: Normal intracranial MRA. Electronically Signed   By: Ulyses Jarred M.D.   On: 03/03/2018 22:51    PHYSICAL EXAM  pleasant elderly Caucasian lady currently not in distress. .  Afebrile. Head is nontraumatic. Neck is supple without bruit.    Cardiac exam no murmur or gallop. Lungs are clear to auscultation. Distal pulses are well felt. Neurological Exam ;  Awake alert oriented to time place and person. Diminished attention, recall. Follows commands well. No aphasia, apraxia dysarthria. Extraocular moments are full range without nystagmus. Dense right homonymous hemianopsia. Fundi not visualized. Vision acuity seems adequate. Face is symmetric without weakness. Tongue is midline. Motor system exam reveals symmetric upper and lower extremity strength without focal weakness. Deep tendon reflexes are symmetric. Plantars are downgoing. Gait not tested.  ASSESSMENT/PLAN Ms. Nancy Norris is a 78 y.o. female with history of remote breast cancer, COPD, CKD stage IV, MI/CAD, multiple myeloma in remission admitted for 36hr amnesia 2 weeks ago, confusion last week with UTI, and headache for the last two days presenting with amnesia, confusion, and headache.   Stroke:  bilateral cortical and subcortical infarcts and various territories felt to be embolic secondary to unknown source  CT head abnormal cortical and subcortical edema in left parietal and left occipital lobe not present on prior CTs, infarct versus metastatic disease  MRI without contrast multifocal bilateral acute ischemia within multiple vascular territories  MRA normal  Carotid Doppler pending  2D Echo pending  TEE to look for embolic source. Arranged with [place] for THURS (schedule full for tomorrow).  If positive for PFO (patent foramen ovale), check bilateral lower extremity venous dopplers to rule out DVT as possible source of stroke. (I have made patient NPO  after midnight).  LDL pending   HgbA1c pending   SCDs for VTE prophylaxis  No antithrombotic prior to admission, now on aspirin 325 mg daily. Continue aspirin. Concern to treat with DAPT given risk of  hemorrhage . Permissive hypertension (OK if < 220/120) but gradually normalize in 5-7 days . Long-term BP goal normotensive  Therapy recommendations: CIR  Disposition: Pending  Other Stroke Risk Factors  Advanced age  Former Cigarette smoker, quit 13 yrs ago  Overweight, Body mass index is 28.8 kg/m., recommend weight loss, diet and exercise as appropriate   Other Active Problems  History of breast cancer  Pancytopenia  / multiple myeloma in remission - on treatment with systemic chemotherapy with Carfilzomib, Cytoxan and Decadron status post 17 cycles.  She has been off treatment for the last 12 months.  CKD stage IV  Depression on Wellbutrin  COPD  Hypothyroidism  Hospital day # [place], MSN, APRN, ANVP-BC, AGPCNP-BC Advanced Practice Stroke Nurse [place] for Schedule & Pager information 03/04/2018 12:34 PM  I have personally obtained history,examined this patient, reviewed notes, independently viewed imaging studies, participated in medical decision making and plan of care.ROS completed by me personally and pertinent positives fully documented  I have made any additions or clarifications directly to the above note. Agree with note above. She presented with right-sided vision last an MRI shows by cerebral embolic infarcts from unknown source. She remains at risk for recurrent strokes and needs ongoing stroke workup and aggressive risk factor modification.check TEE for cardiac source of embolism. Aspirin alone for now given she is high risk for bleeding due to her myeloma and chemotherapy. No family available at the bedside for discussion. Greater than 50% time during this 35 minute visit was spent on counseling and coordination of care but embolic strokes and discussion about evaluation and treatment plan and answering questions  Antony Contras, MD Medical Director [place] Pager: [phone] 03/04/2018 4:55 PM  To  contact Stroke Continuity provider, please refer to [email or web address] After hours, contact General Neurology

## 2018-03-04 NOTE — Progress Notes (Signed)
Pharmacy Antibiotic Note  Nancy Norris is a 78 y.o. female admitted on 03/03/2018 with bacteremia.  Pharmacy has been consulted for vanc dosing.  BCID is positive for staph species and proteus 1/4 bottles. Start vanc and ceftriaxone  Scr 2.52  Plan: Vanc 1.25g IV q48 >>AUC 483, scr 2.52 Ceftriaxone 2g IV q24  Height: 5\' 5"  (165.1 cm) Weight: 173 lb 1 oz (78.5 kg) IBW/kg (Calculated) : 57  Temp (24hrs), Avg:98.6 F (37 C), Min:98 F (36.7 C), Max:99.2 F (37.3 C)  Recent Labs  Lab 02/26/18 1154 02/26/18 1636 03/03/18 1234 03/03/18 1243 03/04/18 0500  WBC 3.3* 3.0*  --  2.4* 1.7*  CREATININE 2.36* 2.29*  --  2.40* 2.52*  LATICACIDVEN  --   --  1.2  --   --     Estimated Creatinine Clearance: 19.7 mL/min (A) (by C-G formula based on SCr of 2.52 mg/dL (H)).    Allergies  Allergen Reactions  . Codeine Anaphylaxis    anaphylasix tolarates oxycodone per care cast  . Latex Shortness Of Breath  . Chocolate     migraines  . Onion Other (See Comments)    Causes migraine headaches  . Zyprexa [Olanzapine] Other (See Comments)    Confusion , dizzy,unsteady  . Adhesive [Tape] Other (See Comments)    blisters  . Heparin Rash    Can flush port with heparin  . Hydrocodone Rash    With extreme itching  . Iodinated Diagnostic Agents Hives, Itching, Rash and Other (See Comments)    Happened 60 years ago Stage IV kidney function  . Oxycodone Rash  . Promethazine-Dm Hives  . Sulfa Antibiotics Itching    Antimicrobials this admission: 2/18 vanc>> 2/18 ceftriaxone  Dose adjustments this admission:   Microbiology results: 2/18 BCID>>Staph species (mecA+), proteus 2/17 blood>>[facility]  Onnie Boer, PharmD, [place], AAHIVP, CPP Infectious Disease Pharmacist 03/04/2018 7:08 PM

## 2018-03-04 NOTE — Progress Notes (Signed)
Inpatient Rehabilitation-Admissions Coordinator   Consult order received and appreciated.   Met with patient at the bedside to discuss team's recommendation for inpatient rehabilitation. Shared booklets, expectations while in CIR, expected length of stay, and anticipated functional level at DC. Pt wants to pursue CIR at this time for post acute rehab.   AC will follow for medical readiness.   Jhonnie Garner, OTR/L  Rehab Admissions Coordinator  [phone] 03/04/2018 1:31 PM

## 2018-03-04 NOTE — PMR Pre-admission (Signed)
PMR Admission Coordinator Pre-Admission Assessment  Patient: Nancy Norris is an 78 y.o., female MRN: [id number] DOB: July 29, 1940 Height: '5\' 5"'$  (165.1 cm) Weight: 80.4 kg  Insurance Information HMO:     PPO:      PCP:      IPA:      80/20: Yes     OTHER:  PRIMARY: Medicare A and B      Policy#: [id number]      Subscriber: Patient CM Name:       Phone#:      Fax#:  Pre-Cert#:       Employer:  Benefits:  Phone #: NA     Name: Verified eligibility on 03/05/18 via [employer] Eff. Date: Part A: 09/16/06; Part B: 09/16/06     Deduct: $1,408.00      Out of Pocket Max: NA      Life Max: NA CIR: Covered per Medicare guidelines once yearly deductible is met      SNF: days 1-20, 100%; days 21-100, 80% Outpatient: 80%     Co-Pay: 20% Home Health: 100%      Co-Pay:  DME: 80%     Co-Pay: 20% Providers: [name] choice SECONDARY: BCBS      Policy#: [id number]      Subscriber: Patient CM Name:       Phone#:      Fax#:  Pre-Cert#:       Employer:  Benefits:  Phone #: [phone]     Name:  Eff. Date:      Deduct:       Out of Pocket Max:       Life Max:  CIR:       SNF:  Outpatient:      Co-Pay:  Home Health:       Co-Pay:  DME:     Co-Pay:   Medicaid Application Date:       Case Manager:  Disability Application Date:       Case Worker:   Emergency Contact Information Contact Information    Name Relation Home Work Crowder Daughter [phone]      Current Medical History  Patient Admitting Diagnosis: Bilateral cortical and subcortical infarcts and various territories  History of Present Illness:  Nancy Norris is a 78 year old right-handed female with past medical history of multiple myeloma in remission on treatment with systemic chemotherapy and followed by Dr. Earlie Server, remote breast cancer, COPD with remote tobacco abuse 13 years ago, chronic anemia/thrombocytopenia, fibromyalgia, hypertension,CKD stage IV with creatinine 2.29-2.93.  Presented  03/03/2018 with altered mental status,dysphagia, headache 2 weeks, lactic acid 1.2, ammonia level X. She was recently seen in the ED on 02/26/2018 for episodes of dizziness and headache as well as a fall. CT of the head was negative. Noted hemoglobin 7.9 with noted history of chronic anemia, urine culture greater than 100,000 Escherichia coli she was discharged to home after being placed on Keflex. Upon this admission cranial CT scan repeated showing 2 separate areas of abnormal cortical and subcortical edema in the left hemisphere affecting the left parietal lobe occipital lobes that were not present on 02/26/2018. MRI the brain showed multifocal bilateral acute ischemia within multiple vascular territories in both hemispheres. MRA unremarkable. Patient did not receive TPA. Echocardiogram 03/05/2018 showed ejection fraction 65%. Normal systolic function. Carotid Dopplers with no ICA stenosis. Blood cultures positive for staph species and Proteus started on vancomycin and ceftriaxone 03/06/2018 swallow study completed as well as esophagram 03/06/2018 showing nonspecific  esophageal dysmotility no appreciable upper gastric diverticulum and currently maintained on a regular diet. TEE completed 03/07/2018 with ejection fraction 65% and no thrombus or vegetation. Small PFO seen by color Doppler. Patient remained on aspirin for CVA prophylaxis. Gastroenterology consulted Dr. Penelope Coop for evaluation of noted abnormal barium swallow,anemia question GI bleed  .An upper GI endoscopy completed 03/08/2020 per Dr.Schooler that showed a large fungating malignant appearing ulcerated proximal stomach mass that had areas of blood seen consistent with recent bleeding and biopsies taken. Mass likely causing partial obstruction with her by mouth intake. Recommendations were for CT of abdomen and pelvis completed showing marked wall thickening in the esophagogastric junction and proximal stomach consistent with known neoplasm. Bulky  lymphadenopathy in the gastrohepatic ligament compatible with metastatic disease. There was a 2.7 cm hypoattenuating lesion in the spleen as well as scattered tiny pulmonary nodules measuring up to 5 mm and mild lymphadenopathy noted in the upper mediastinum. 1.9 x 1.2 cm irregular lesion posterior left upper lobe at the site of a 4 cm mass that was also noted on PET scan 10/11/2015 features compatible with residual of that lesion. Oncology services Dr. Earlie Server follow-up in regards to findings with pathology pending and current plan is to await biopsy report from gastric mass to give further recommendations about management suspicious malignancy. Hospital course complicated by findings of acute DVT left posterior tibial vein, and left peroneal vein with radiology services consulted 03/09/2018 and underwent IVC filter placement per Dr. Barbie Banner of interventional radiology. On 03/10/2018 cardiology again follow-up for nonspecific chest discomfort and dizziness with elevated troponin 2.15-2.43.EKG did show new Q waves on her ECG in the inferior leads. Echocardiogram repeated to 20 05/05/2018 showing ejection fraction of 13% normal systolic function. The right ventricle normal systolic function. No increase in right ventricular wall thickness. It was discussed possible need for cardiac catheterization and patient not interested at this time. Patient is currently on a soft diet. Hemoglobin holding stable at 8.6.   Patient's medical record from [facility] has been reviewed by the rehabilitation admission coordinator and physician. NIH Stroke scale: 2     Past Medical History  Past Medical History:  Diagnosis Date  . Anemia   . Anginal pain ([place])    used NTG x 2 May 31 and 06/15/13   . Anxiety   . Arthritis   . B12 deficiency 12/04/2014  . Breast cancer ([place])   . Complication of anesthesia   . COPD (chronic obstructive pulmonary disease) ([place])   . Cryptococcal pneumonitis ([place]) 11/22/2015  .  Depression   . Dizziness   . Dyspnea   . Fibromyalgia   . Fibromyalgia   . GERD (gastroesophageal reflux disease)   . Headache(784.0)   . Heart murmur   . Hemoptysis 10/21/2015  . History of blood transfusion    last one May 12   . Hx of cardiovascular stress test    LexiScan with low level exercise Myoview (02/2013): No ischemia, EF 72%; normal study  . Hx of echocardiogram    a.  Echocardiogram (12/26/2012): EF 08-65%, grade 1 diastolic dysfunction;   b.  Echocardiogram (02/2013): EF 55-60%, no WMA, trivial effusion  . Hyperkalemia   . Hyponatremia   . Hypotension   . Hypothyroidism   . Mucositis   . Multiple myeloma   . Myocardial infarction [facility])    in past, patient was unaware.   . Neuropathy   . Nodule of left lung 09/13/2015  . Pain in joint, pelvic  region and thigh 07/07/2015  . Pneumonia    several  . PONV (postoperative nausea and vomiting) 2008   after mastestomy    Family History   family history includes Arthritis in her mother; Asthma in her mother; Cancer in her sister; Hyperlipidemia in her brother.  Prior Rehab/Hospitalizations Has the patient had major surgery during 100 days prior to admission? No    Current Medications  Current Facility-Administered Medications:  .   stroke: mapping our early stages of recovery book, , Does not apply, Once, Wilford Corner, MD .  0.9 %  sodium chloride infusion (Manually program via Guardrails IV Fluids), 250 mL, Intravenous, Once, Wilford Corner, MD, Last Rate: 10 mL/hr at 03/05/18 2227 .  acetaminophen (TYLENOL) tablet 650 mg, 650 mg, Oral, Q6H PRN, 650 mg at 03/09/18 0324 **OR** acetaminophen (TYLENOL) suppository 650 mg, 650 mg, Rectal, Q6H PRN, Wilford Corner, MD .  albuterol (PROVENTIL) (2.5 MG/3ML) 0.083% nebulizer solution 3 mL, 3 mL, Inhalation, Q6H PRN, Wilford Corner, MD .  aspirin tablet 325 mg, 325 mg, Oral, Daily, Wilford Corner, MD, 325 mg at 03/11/18 0856 .  atorvastatin (LIPITOR) tablet 20  mg, 20 mg, Oral, q1800, Wilford Corner, MD, 20 mg at 03/10/18 1651 .  buPROPion [facility]) tablet 75 mg, 75 mg, Oral, BID, Wilford Corner, MD, 75 mg at 03/11/18 0857 .  cephALEXin (KEFLEX) capsule 250 mg, 250 mg, Oral, Q8H, Wilford Corner, MD, 250 mg at 03/11/18 0557 .  citalopram (CELEXA) tablet 40 mg, 40 mg, Oral, Daily, Wilford Corner, MD, 40 mg at 03/11/18 0856 .  cromolyn (OPTICROM) 4 % ophthalmic solution 1-2 drop, 1-2 drop, Both Eyes, QID PRN, Wilford Corner, MD .  feeding supplement (ENSURE ENLIVE) (ENSURE ENLIVE) liquid 237 mL, 237 mL, Oral, BID BM, Florencia Reasons, MD, 237 mL at 03/11/18 0901 .  folic acid (FOLVITE) tablet 1 mg, 1 mg, Oral, Daily, Wilford Corner, MD, 1 mg at 03/11/18 0855 .  gabapentin (NEURONTIN) capsule 300 mg, 300 mg, Oral, Daily PRN, Wilford Corner, MD .  gabapentin (NEURONTIN) capsule 600 mg, 600 mg, Oral, QHS, Wilford Corner, MD, 600 mg at 03/10/18 2102 .  lactated ringers infusion, , Intravenous, Continuous, Florencia Reasons, MD, Last Rate: 75 mL/hr at 03/10/18 2336 .  levothyroxine (SYNTHROID, LEVOTHROID) tablet 150 mcg, 150 mcg, Oral, Q0600, Wilford Corner, MD, 150 mcg at 03/11/18 0557 .  loratadine (CLARITIN) tablet 10 mg, 10 mg, Oral, Daily, Wilford Corner, MD, 10 mg at 03/11/18 0856 .  midodrine (PROAMATINE) tablet 10 mg, 10 mg, Oral, Daily, Wilford Corner, MD, 10 mg at 03/11/18 0857 .  montelukast (SINGULAIR) tablet 10 mg, 10 mg, Oral, QHS, Wilford Corner, MD, 10 mg at 03/10/18 2102 .  ondansetron (ZOFRAN) injection 4 mg, 4 mg, Intravenous, Q6H PRN, Schorr, Rhetta Mura, NP, 4 mg at 03/11/18 0004 .  pantoprazole (PROTONIX) EC tablet 40 mg, 40 mg, Oral, Daily, Ronnette Juniper, MD, 40 mg at 03/10/18 1157 .  pilocarpine (SALAGEN) tablet 5 mg, 5 mg, Oral, BID, Wilford Corner, MD, 5 mg at 03/11/18 0858 .  sodium chloride flush (NS) 0.9 % injection 3 mL, 3 mL, Intravenous, Once, Wilford Corner, MD .  sucralfate (CARAFATE) 1 GM/10ML  suspension 1 g, 1 g, Oral, TID WC & HS, Florencia Reasons, MD, 1 g at 03/11/18 0855 .  traMADol (ULTRAM) tablet 50 mg, 50 mg, Oral, Q6H PRN, Wilford Corner, MD, 50 mg at 03/10/18 1201 .  vitamin B-12 (CYANOCOBALAMIN) tablet 1,000 mcg, 1,000 mcg, Oral, Daily, Wilford Corner, MD, 1,000 mcg at  03/10/18 1056  Patients Current Diet:   Diet Order            DIET SOFT Room service appropriate? Yes; Fluid consistency: Thin  Diet effective now        Diet general              Precautions / Restrictions Precautions Precautions: Fall Precaution Comments: Rt field cut Restrictions Weight Bearing Restrictions: No   Has the patient had 2 or more falls or a fall with injury in the past year?Yes  Prior Activity Level Limited Community (1-2x/wk): did not drive or work PTA; stayed in mostly during cold whether; ordered groceries to her home, had home delivery medications  Prior Functional Level Do you want Prior Function Level of Independence: Independent Comments: Rides [employer] for transportation; walks without DME. Does cooking/cleaning. ; cancer center provides rides; yeasr of Chemo left; enjoys "diamond" painting; enjoys crafts; does her own financial and Best boy from other? Self Care: Did the patient need help bathing, dressing, using the toilet or eating?  Independent  Indoor Mobility: Did the patient need assistance with walking from room to room (with or without device)? Independent  Stairs: Did the patient need assistance with internal or external stairs (with or without device)? Independent  Functional Cognition: Did the patient need help planning regular tasks such as shopping or remembering to take medications? Independent  Home Assistive Devices / [place] Devices/Equipment: Eyeglasses Home Equipment: Environmental consultant - 2 wheels, [place] - single point, Civil engineer, contracting, Environmental consultant - 4 wheels  Prior Device Use: Indicate devices/aids used by the patient prior to current  illness, exacerbation or injury? None of the above  Prior Functional Level Vocation: Retired Comments: Rides [employer] for transportation; walks without DME. Does cooking/cleaning. ; cancer center provides rides; yeasr of Chemo left; enjoys "diamond" painting; enjoys crafts; does her own Patent attorney   Prior Functional Level Current Functional Level  Bed Mobility Independent Supervision with use of bedrails  Transfers Independent  Min A  Mobility - Walk/Wheelchair Independent Min A   Mobility - Ambulation/Gait Independent Min A 100 feet with IV pole; modest instability with ambulation, requiring 1-2 UE supports and min assist for safety and nevigating around obstacles in hallway  Upper Body Dressing Independent Min A  Lower Body Dressing Independent Min A  Grooming Independent Min A  Eating/Drinking Independent Min A  Economist Min A   Bladder Continence Continent, Independent Continent, Min A  Bowel Management  Continent, Independent Continent, Min A  Stair Administrator NT  Communication Independent Expressive difficulties  Memory Independent Decreased STM, decreased recall of precautions  Cooking/Meal Prep  Insurance account manager  Independent   Driving  Dependent     Special needs/care consideration BiPAP/CPAP: no CPM: no Continuous Drip IV portocath capped off Dialysis: no        Days: NA Life Vest: no Oxygen: no Special Bed: no Trach Size: no Wound Vac (area): no      Location: NA Skin tears to bilateral arms and hands; skin tear to buttocks Bowel mgmt:continent, Last BM  03/10/2018 Bladder mgmt: continent Diabetic mgmt: no  Previous Home Environment Living Arrangements: Alone  Lives With: Alone Available Help at Discharge: Neighbor, Available PRN/intermittently, Family Type of Home: Apartment Home Layout: One level Home Access: Ramped entrance Bathroom Shower/Tub: Scientist, forensic: Standard Bathroom Accessibility: Yes How Accessible: Accessible via walker Home Care Services: No  Discharge Living Setting Plans for Discharge Living Setting: Alone, Apartment Type of Home at Discharge: Apartment Discharge Home Layout: One level Discharge Home Access: [place] entrance Discharge Bathroom Shower/Tub: Tub/shower unit(with bench with back) Discharge Bathroom Toilet: Standard Discharge Bathroom Accessibility: Yes How Accessible: Accessible via walker(if sidestep) Does the patient have any problems obtaining your medications?: No(has home delivery option)  Social/Family/Support Systems Patient Roles: Other (Comment)(has neighbor who checks in) Contact Information: Sherrie Marsan (daughter): [phone]; work: [phone]; mobile:[phone]; [name] (close family friend): [phone] Anticipated Caregiver: [name] can check in daily after work (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) Anticipated Ambulance person Information: see above Caregiver Availability: Other (Comment)(after work, evenings only) Discharge Plan Discussed with Primary Caregiver: Yes Is Caregiver In Agreement with Plan?: Yes Does Caregiver/Family have Issues with Lodging/Transportation while Pt is in Rehab?: No  Goals/Additional Needs Patient/Family Goal for Rehab: PT/OT: Mod I; SLP: Mod I Expected length of stay: 7-10 days Cultural Considerations: [facility] Dietary Needs: Soft Equipment Needs: TBD Special Service Needs: on chemotherapy Pt/Family Agrees to Admission and willing to participate: Yes Program Orientation Provided & Reviewed with Pt/Caregiver Including Roles  & Responsibilities: Yes  Barriers to Discharge: Decreased caregiver support, Lack of/limited family support, Pending chemo/radiation  Patient Condition: I have reviewed medical records from [facility], spoken with CM, the RN, and the patient and her close friend. I met  with patient at the bedside to discuss and gather information needed for the inpatient rehabilitation assessment.  Patient will benefit from ongoing PT, OT, and SLP, can actively participate in 3 hours of therapy a day 5 days of the week, and can make measurable gains during the admission.  Patient will also benefit from the coordinated team approach during an Inpatient Acute Rehabilitation admission.  The patient will receive intensive therapy as well as Rehabilitation physician, nursing, social worker, and care management interventions.  Due to safety, skin/wound care, disease management, medical administration, pain management, and patient education the patient requires 24 hour a day rehabilitation nursing.  The patient is currently min assist with mobility and basic ADLs.  Discharge setting and therapy post discharge at home with home health is anticipated.  Patient has agreed to participate in the Acute Inpatient Rehabilitation Program and will admit today.  Preadmission Screen Completed By: Jhonnie Garner with updates by  Jamse Arn RN MSN, 03/11/2018 10:46 AM ______________________________________________________________________   Discussed status with Dr. Posey Pronto  on  03/11/2018 at 1040 and received telephone approval for admission today.  Admission Coordinator: Jhonnie Garner with updates by   Jamse Arn RN MSN, time  1040 Date  03/11/2018   Assessment/Plan: Diagnosis: Bilateral cortical and subcortical infarcts and various territories  1. Does the need for close, 24 hr/day  Medical supervision in concert with the patient's rehab needs make it unreasonable for this patient to be served in a less intensive setting? Yes  2. Co-Morbidities requiring supervision/potential complications: multiple myeloma in remission on treatment with systemic chemotherapy and followed by Dr. Earlie Server, remote breast cancer, COPD with remote tobacco abuse 13 years ago, chronic anemia/thrombocytopenia, fibromyalgia,  hypertension,CKD stage IV with creatinine 2.29-2.93 3. Due to safety, disease management and patient education, does the patient require 24 hr/day rehab nursing? Yes 4. Does the patient require coordinated care of a physician, rehab nurse, PT (1-2 hrs/day, 5 days/week) and OT (1-2 hrs/day, 5 days/week) to address physical and functional deficits in the context of the above medical diagnosis(es)? Yes Addressing deficits in the following  areas: balance, endurance, locomotion, strength, transferring, bathing, dressing, toileting and psychosocial support 5. Can the patient actively participate in an intensive therapy program of at least 3 hrs of therapy 5 days a week? Yes 6. The potential for patient to make measurable gains while on inpatient rehab is excellent and good 7. Anticipated functional outcomes upon discharge from inpatients are: modified independent and supervision PT, modified independent and supervision OT, n/a SLP 8. Estimated rehab length of stay to reach the above functional goals is: 7-10 days. 9. Anticipated D/C setting: Home 10. Anticipated post D/C treatments: HH therapy and Home excercise program 11. Overall Rehab/Functional Prognosis: good  Jhonnie Garner Admissions Coordinator With updates by Cleatrice Burke RN MSN updates 03/11/2018

## 2018-03-04 NOTE — Consult Note (Signed)
   [facility] CM Inpatient Consult   03/04/2018  HARVEEN FLESCH 07-31-1940 [id number]   Patient screened for potential [facility] Care Management services due to unplanned readmission risk score of 30% (extreme).  Spoke with inpatient RNCM. Writer informed disposition plans are for CIR. No identifiable [facility] Care Management needs at this time.   Marthenia Rolling, MSN-Ed, RN,BSN [facility] Liaison [phone]

## 2018-03-04 NOTE — Progress Notes (Signed)
Rehab Admissions Coordinator Note:  Patient was screened by Nancy Norris for appropriateness for an Inpatient Acute Rehab Consult per PT recommendation.  At this time, we are recommending Inpatient Rehab consult.  Nancy Norris 03/04/2018, 8:26 AM  I can be reached at [phone].

## 2018-03-04 NOTE — Progress Notes (Signed)
Carotid artery duplex has been completed. Preliminary results can be found in CV Proc through chart review.   03/04/18 1:26 PM Carlos Levering RVT

## 2018-03-05 ENCOUNTER — Inpatient Hospital Stay: Payer: Medicare Other

## 2018-03-05 ENCOUNTER — Inpatient Hospital Stay (HOSPITAL_COMMUNITY): Payer: Medicare Other

## 2018-03-05 DIAGNOSIS — I361 Nonrheumatic tricuspid (valve) insufficiency: Secondary | ICD-10-CM

## 2018-03-05 DIAGNOSIS — D696 Thrombocytopenia, unspecified: Secondary | ICD-10-CM

## 2018-03-05 DIAGNOSIS — C9000 Multiple myeloma not having achieved remission: Secondary | ICD-10-CM

## 2018-03-05 DIAGNOSIS — N184 Chronic kidney disease, stage 4 (severe): Secondary | ICD-10-CM

## 2018-03-05 DIAGNOSIS — R1310 Dysphagia, unspecified: Secondary | ICD-10-CM

## 2018-03-05 DIAGNOSIS — D630 Anemia in neoplastic disease: Secondary | ICD-10-CM

## 2018-03-05 LAB — BASIC METABOLIC PANEL
Anion gap: 12 (ref 5–15)
BUN: 18 mg/dL (ref 8–23)
CO2: 20 mmol/L — ABNORMAL LOW (ref 22–32)
Calcium: 8.3 mg/dL — ABNORMAL LOW (ref 8.9–10.3)
Chloride: 106 mmol/L (ref 98–111)
Creatinine, Ser: 2.44 mg/dL — ABNORMAL HIGH (ref 0.44–1.00)
GFR calc Af Amer: 22 mL/min — ABNORMAL LOW (ref >60)
GFR, EST NON AFRICAN AMERICAN: 19 mL/min — AB (ref >60)
Glucose, Bld: 119 mg/dL — ABNORMAL HIGH (ref 70–99)
Potassium: 3.5 mmol/L (ref 3.5–5.1)
SODIUM: 138 mmol/L (ref 135–145)

## 2018-03-05 LAB — CBC WITH DIFFERENTIAL/PLATELET
Abs Immature Granulocytes: 0.01 10*3/uL (ref 0.00–0.07)
BASOS PCT: 1 %
Basophils Absolute: 0 10*3/uL (ref 0.0–0.1)
Eosinophils Absolute: 0.1 10*3/uL (ref 0.0–0.5)
Eosinophils Relative: 7 %
HCT: 23 % — ABNORMAL LOW (ref 36.0–46.0)
Hemoglobin: 7 g/dL — ABNORMAL LOW (ref 12.0–15.0)
Immature Granulocytes: 1 %
Lymphocytes Relative: 29 %
Lymphs Abs: 0.6 10*3/uL — ABNORMAL LOW (ref 0.7–4.0)
MCH: 31.4 pg (ref 26.0–34.0)
MCHC: 30.4 g/dL (ref 30.0–36.0)
MCV: 103.1 fL — ABNORMAL HIGH (ref 80.0–100.0)
Monocytes Absolute: 0.4 10*3/uL (ref 0.1–1.0)
Monocytes Relative: 22 %
NEUTROS ABS: 0.8 10*3/uL — AB (ref 1.7–7.7)
Neutrophils Relative %: 40 %
PLATELETS: 62 10*3/uL — AB (ref 150–400)
RBC: 2.23 MIL/uL — AB (ref 3.87–5.11)
RDW: 24.3 % — ABNORMAL HIGH (ref 11.5–15.5)
WBC: 2 10*3/uL — ABNORMAL LOW (ref 4.0–10.5)
nRBC: 0 % (ref 0.0–0.2)

## 2018-03-05 LAB — HOMOCYSTEINE: HOMOCYSTEINE-NORM: 15.5 umol/L (ref 0.0–19.2)

## 2018-03-05 MED ORDER — SODIUM CHLORIDE 0.9% IV SOLUTION: 250.0000 mL | Freq: Once | INTRAVENOUS | Status: DC

## 2018-03-05 MED ORDER — FUROSEMIDE 10 MG/ML IJ SOLN
20.0000 mg | Freq: Once | INTRAMUSCULAR | Status: AC
  Administered 2018-03-05: 20 mg via INTRAVENOUS
  Filled 2018-03-05: qty 4

## 2018-03-05 NOTE — Evaluation (Signed)
Clinical/Bedside Swallow Evaluation Patient Details  Name: Nancy Norris MRN: [id number] Date of Birth: 03/02/1940  Today's Date: 03/05/2018 Time: SLP Start Time (ACUTE ONLY): 6644 SLP Stop Time (ACUTE ONLY): 1330 SLP Time Calculation (min) (ACUTE ONLY): 25 min  Past Medical History:  Past Medical History:  Diagnosis Date  . Anemia   . Anginal pain ([place])    used NTG x 2 May 31 and 06/15/13   . Anxiety   . Arthritis   . B12 deficiency 12/04/2014  . Breast cancer ([place])   . Complication of anesthesia   . COPD (chronic obstructive pulmonary disease) ([place])   . Cryptococcal pneumonitis ([place]) 11/22/2015  . Depression   . Dizziness   . Dyspnea   . Fibromyalgia   . Fibromyalgia   . GERD (gastroesophageal reflux disease)   . Headache(784.0)   . Heart murmur   . Hemoptysis 10/21/2015  . History of blood transfusion    last one May 12   . Hx of cardiovascular stress test    LexiScan with low level exercise Myoview (02/2013): No ischemia, EF 72%; normal study  . Hx of echocardiogram    a.  Echocardiogram (12/26/2012): EF 03-47%, grade 1 diastolic dysfunction;   b.  Echocardiogram (02/2013): EF 55-60%, no WMA, trivial effusion  . Hyperkalemia   . Hyponatremia   . Hypotension   . Hypothyroidism   . Mucositis   . Multiple myeloma   . Myocardial infarction [facility]    in past, patient was unaware.   . Neuropathy   . Nodule of left lung 09/13/2015  . Pain in joint, pelvic region and thigh 07/07/2015  . Pneumonia    several  . PONV (postoperative nausea and vomiting) 2008   after mastestomy   Past Surgical History:  Past Surgical History:  Procedure Laterality Date  . ABDOMINAL HYSTERECTOMY  1981  . AV FISTULA PLACEMENT Left 06/19/2013   Procedure: CREATION OF LEFT ARM ARTERIOVENOUS (AV) FISTULA ;  Surgeon: Angelia Mould, MD;  Location: [facility];  Service: Vascular;  Laterality: Left;  . BREAST RECONSTRUCTION    . BREAST SURGERY Right    reduction  . CATARACT EXTRACTION, BILATERAL     . CHOLECYSTECTOMY  1971  . COLONOSCOPY    . EYE SURGERY Bilateral    lens implant  . history of Port removal    . LUNG BIOPSY  10/21/2015  . MASTECTOMY Left 2008  . PORTACATH PLACEMENT  12/2012   has had 2  . RESECTION OF ARTERIOVENOUS FISTULA ANEURYSM Left 06/04/2017   Procedure: LIGATION ANEURYSM OF LEFT ARTERIOVENOUS FISTULA;  Surgeon: Angelia Mould, MD;  Location: [place];  Service: Vascular;  Laterality: Left;  . Status post stem cell transplant on September 28, 2008.     HPI:  Patient is a 78 y/o female presents with confusion, amnesia and HA. Head CT- left occipital and left frontal parietial infarcts. MRI- multiple posterior/anterior bilateral infarcts. PMH includes multiple myeloma currently getting chemo, MI, COPD, hypotension, breast ca, fibromyalgia.    Assessment / Plan / Recommendation Clinical Impression  Pt was seen for bedside swallow evaluation following reports from the pt's nurse, Nancy Norris, that she was having some difficulty swallowing pills. Pt reported that she has been having trouble swallowing for the past year with some worsening of symptoms. Per the pt she has been having the sensation of "sticking" with bread and large pieces of meats which is improved with a liquid wash and only lasts for approximately 1-2 minutes. Pt identified  the sternal notch as the approximate site of the sensation. She tolerated all solids and liquids without overt s/sx of aspiration but did report the globus sensation with regular texture solids. It is recommended that the pt's current diet be continued at this time and that larger medications be administered in puree (e.g., apple sauce). A modified barium swallow study is clinically indicated to further assess swallow function. However, it will be deferred until after 03/06/18 since she will be NPO for the TEE on 03/06/18 SLP Visit Diagnosis: Dysphagia, unspecified (R13.10)    Aspiration Risk       Diet Recommendation Regular;Thin  liquid   Liquid Administration via: Cup;Straw Medication Administration: Whole meds with puree Supervision: Patient able to self feed Compensations: Small sips/bites;Slow rate;Follow solids with liquid Postural Changes: Seated upright at 90 degrees;Remain upright for at least 30 minutes after po intake    Other  Recommendations Oral Care Recommendations: Oral care BID   Follow up Recommendations Inpatient Rehab      Frequency and Duration min 2x/week  2 weeks       Prognosis Prognosis for Safe Diet Advancement: Good      Swallow Study   General Date of Onset: 03/05/17 HPI: Patient is a 78 y/o female presents with confusion, amnesia and HA. Head CT- left occipital and left frontal parietial infarcts. MRI- multiple posterior/anterior bilateral infarcts. PMH includes multiple myeloma currently getting chemo, MI, COPD, hypotension, breast ca, fibromyalgia.  Type of Study: Bedside Swallow Evaluation Previous Swallow Assessment: None Diet Prior to this Study: Regular;Thin liquids Respiratory Status: Room air History of Recent Intubation: No Behavior/Cognition: Alert;Cooperative;Pleasant mood Oral Cavity Assessment: Within Functional Limits Oral Care Completed by SLP: No Oral Cavity - Dentition: Adequate natural dentition Vision: Functional for self-feeding Self-Feeding Abilities: Able to feed self Patient Positioning: Upright in bed;Postural control adequate for testing Baseline Vocal Quality: Normal Volitional Cough: Strong Volitional Swallow: Able to elicit    Oral/Motor/Sensory Function Overall Oral Motor/Sensory Function: Within functional limits   Ice Chips Ice chips: Within functional limits Presentation: Spoon   Thin Liquid Thin Liquid: Within functional limits Presentation: Straw;Cup    Nectar Thick Nectar Thick Liquid: Not tested   Honey Thick Honey Thick Liquid: Not tested   Puree Puree: Within functional limits Presentation: Spoon   Solid     Solid:  Impaired Presentation: Self Fed Pharyngeal Phase Impairments: Multiple swallows(globus sensation)     Nancy Norris I. Nancy Norris, Nancy Norris, Nancy Norris Office number [phone] Pager [phone]  Horton Marshall 03/05/2018,2:49 PM

## 2018-03-05 NOTE — Progress Notes (Signed)
  Speech Language Pathology Treatment: Cognitive-Linquistic  Patient Details Name: Nancy Norris MRN: [id number] DOB: [birth year] Today's Date: 03/05/2018 Time: 6283-1517 SLP Time Calculation (min) (ACUTE ONLY): 12 min  Assessment / Plan / Recommendation Clinical Impression  Pt was seen for cognitive-linguistic treatment. She was alert and reported that she has has noticed some improvement in her cognition since she was evaluated on 03/04/18 but it is still not back to baseline. She demonstrated 100% accuracy with immediate recall of 4 related items and 20% accuracy with 5-item recall increasing to 100% accuracy with moderate cues for the 5th item. The session was abbreviated per pt's request due to pt's complaint of worsening headache. SLP will contiue to follow pt.    HPI HPI: Patient is a 78 y/o female presents with confusion, amnesia and HA. Head CT- left occipital and left frontal parietial infarcts. MRI- multiple posterior/anterior bilateral infarcts. PMH includes multiple myeloma currently getting chemo, MI, COPD, hypotension, breast ca, fibromyalgia.       SLP Plan  Continue with current plan of care       Recommendations  Diet recommendations: Regular;Thin liquid Liquids provided via: Cup;Straw Medication Administration: Whole meds with puree Supervision: Patient able to self feed Compensations: Small sips/bites;Slow rate;Follow solids with liquid                Oral Care Recommendations: Oral care BID Follow up Recommendations: Inpatient Rehab SLP Visit Diagnosis: Dysphagia, unspecified (R13.10) Plan: Continue with current plan of care       Nancy Norris I. Hardin Negus, [place], [place] Office number [phone] Pager [place] 03/05/2018, 3:16 PM

## 2018-03-05 NOTE — Progress Notes (Signed)
STROKE TEAM PROGRESS NOTE   INTERVAL HISTORY No family is at the bedside.  Nancy Norris recounted HPI with Dr. Leonie Man.   Vitals:   03/04/18 1612 03/05/18 0609 03/05/18 0700 03/05/18 1346  BP: (!) 119/59  106/66 125/82  Pulse:   67 65  Resp: '16  13 13  '$ Temp: 99.2 F (37.3 C) 97.9 F (36.6 C) 98 F (36.7 C) 98.2 F (36.8 C)  TempSrc: Oral Oral Oral Oral  SpO2:   94% 99%  Weight:   80.7 kg   Height:        CBC:  Recent Labs  Lab 03/03/18 1243 03/04/18 0500 03/05/18 1106  WBC 2.4* 1.7* 2.0*  NEUTROABS 1.1*  --  0.8*  HGB 8.3* 7.5* 7.0*  HCT 26.8* 24.0* 23.0*  MCV 104.7* 102.6* 103.1*  PLT 54* 60* 62*    Basic Metabolic Panel:  Recent Labs  Lab 03/04/18 0500 03/05/18 1106  NA 141 138  K 4.1 3.5  CL 112* 106  CO2 22 20*  GLUCOSE 107* 119*  BUN 17 18  CREATININE 2.52* 2.44*  CALCIUM 8.6* 8.3*   Lipid Panel:     Component Value Date/Time   CHOL 199 12/18/2017 1137   TRIG 221 (H) 12/18/2017 1137   HDL 35 (L) 12/18/2017 1137   CHOLHDL 5.7 (H) 12/18/2017 1137   VLDL 34 (H) 05/30/2016 1152   LDLCALC 128 (H) 12/18/2017 1137   HgbA1c:  Lab Results  Component Value Date   HGBA1C 5.1 09/13/2017   Urine Drug Screen: No results found for: LABOPIA, COCAINSCRNUR, LABBENZ, AMPHETMU, THCU, LABBARB  Alcohol Level     Component Value Date/Time   ETH <10 03/03/2018 1244    IMAGING [name] Head Wo Contrast  Result Date: 03/03/2018 CLINICAL DATA:  Stroke follow-up EXAM: MRA HEAD WITHOUT CONTRAST TECHNIQUE: Angiographic images of the Circle of Willis were obtained using MRA technique without intravenous contrast. COMPARISON:  Brain MRI 03/03/2018 FINDINGS: POSTERIOR CIRCULATION: --Basilar artery: Normal. --Posterior cerebral arteries: Normal. Both originate from the basilar artery. --Superior cerebellar arteries: Normal. --Inferior cerebellar arteries: Normal anterior and posterior inferior cerebellar arteries. ANTERIOR CIRCULATION: --Intracranial internal carotid arteries: Normal.  --Anterior cerebral arteries: Normal. Both A1 segments are present. Patent anterior communicating artery. --Middle cerebral arteries: Normal. --Posterior communicating arteries: Absent bilaterally. IMPRESSION: Normal intracranial MRA. Electronically Signed   By: Ulyses Jarred M.D.   On: 03/03/2018 22:51   Vas US Carotid (at [place] Only)  Result Date: 03/04/2018 Carotid Arterial Duplex Study Indications: CVA. Performing Technologist: Oliver Hum RVT  Examination Guidelines: A complete evaluation includes B-mode imaging, spectral Doppler, color Doppler, and power Doppler as needed of all accessible portions of each vessel. Bilateral testing is considered an integral part of a complete examination. Limited examinations for reoccurring indications may be performed as noted.  Right Carotid Findings: +----------+--------+-------+--------+----------------------+------------------+           PSV cm/sEDV    StenosisDescribe              Comments                             cm/s                                                    +----------+--------+-------+--------+----------------------+------------------+ CCA Prox  68      18                                   intimal thickening +----------+--------+-------+--------+----------------------+------------------+ CCA Distal77      20                                   intimal thickening +----------+--------+-------+--------+----------------------+------------------+ ICA Prox  72      21             smooth and                                                                heterogenous                             +----------+--------+-------+--------+----------------------+------------------+ ICA Distal70      27                                                      +----------+--------+-------+--------+----------------------+------------------+ ECA       63      13                                                       +----------+--------+-------+--------+----------------------+------------------+ +----------+--------+-------+--------+-------------------+           PSV cm/sEDV cmsDescribeArm Pressure (mmHG) +----------+--------+-------+--------+-------------------+ [id number]                                         +----------+--------+-------+--------+-------------------+ +---------+--------+--+--------+-+---------+ VertebralPSV cm/s31EDV cm/s9Antegrade +---------+--------+--+--------+-+---------+  Left Carotid Findings: +----------+--------+--------+--------+-----------------------+--------+           PSV cm/sEDV cm/sStenosisDescribe               Comments +----------+--------+--------+--------+-----------------------+--------+ CCA Prox  82      23              smooth and heterogenous         +----------+--------+--------+--------+-----------------------+--------+ CCA Distal64      19              smooth and heterogenous         +----------+--------+--------+--------+-----------------------+--------+ ICA Prox  66      20              smooth and heterogenous         +----------+--------+--------+--------+-----------------------+--------+ ICA Distal81      32                                              +----------+--------+--------+--------+-----------------------+--------+ ECA       70  17                                              +----------+--------+--------+--------+-----------------------+--------+ +----------+--------+--------+--------+-------------------+ SubclavianPSV cm/sEDV cm/sDescribeArm Pressure (mmHG) +----------+--------+--------+--------+-------------------+           96                                          +----------+--------+--------+--------+-------------------+ +---------+--------+--+--------+--+---------+ VertebralPSV cm/s54EDV cm/s19Antegrade +---------+--------+--+--------+--+---------+  Summary: Right Carotid:  Velocities in the right ICA are consistent with a 1-39% stenosis. Left Carotid: Velocities in the left ICA are consistent with a 1-39% stenosis. Vertebrals: Bilateral vertebral arteries demonstrate antegrade flow. *See table(s) above for measurements and observations.     Preliminary     PHYSICAL EXAM  pleasant elderly Caucasian lady currently not in distress. . Afebrile. Head is nontraumatic. Neck is supple without bruit.    Cardiac exam no murmur or gallop. Lungs are clear to auscultation. Distal pulses are well felt. Neurological Exam ;  Awake alert oriented to time place and person. Diminished attention, recall. Follows commands well. No aphasia, apraxia dysarthria. Extraocular moments are full range without nystagmus. Dense right homonymous hemianopsia. Fundi not visualized. Vision acuity seems adequate. Face is symmetric without weakness. Tongue is midline. Motor system exam reveals symmetric upper and lower extremity strength without focal weakness. Deep tendon reflexes are symmetric. Plantars are downgoing. Gait not tested.  ASSESSMENT/PLAN Nancy Norris is a 78 y.o. female with history of remote breast cancer, COPD, CKD stage IV, MI/CAD, multiple myeloma in remission admitted for 36hr amnesia 2 weeks ago, confusion last week with UTI, and headache for the last two days presenting with amnesia, confusion, and headache.   Stroke:  bilateral cortical and subcortical infarcts and various territories felt to be embolic secondary to unknown source  CT head abnormal cortical and subcortical edema in left parietal and left occipital lobe not present on prior CTs, infarct versus metastatic disease  MRI without contrast multifocal bilateral acute ischemia within multiple vascular territories  MRA normal  Carotid Doppler pending  2D Echo pending  TEE to look for embolic source. Arranged with [place] for THURS (schedule full for tomorrow).  If positive for PFO  (patent foramen ovale), check bilateral lower extremity venous dopplers to rule out DVT as possible source of stroke. (I have made patient NPO after midnight).  LDL pending   HgbA1c pending   SCDs for VTE prophylaxis  No antithrombotic prior to admission, now on aspirin 325 mg daily. Continue aspirin. Concern to treat with DAPT given risk of hemorrhage . Permissive hypertension (OK if < 220/120) but gradually normalize in 5-7 days . Long-term BP goal normotensive  Therapy recommendations: CIR  Disposition: Pending  Other Stroke Risk Factors  Advanced age  Former Cigarette smoker, quit 13 yrs ago  Overweight, Body mass index is 29.61 kg/m., recommend weight loss, diet and exercise as appropriate   Other Active Problems  History of breast cancer  Pancytopenia  / multiple myeloma in remission - on treatment with systemic chemotherapy with Carfilzomib, Cytoxan and Decadron status post 17 cycles.  Nancy Norris has been off treatment for the last 12 months.  CKD stage IV  Depression on Wellbutrin  COPD  Hypothyroidism  Hospital day # 2  Nancy Norris presented with right-sided vision loss and MRI shows bicerebral embolic infarcts from unknown source. Nancy Norris remains at risk for recurrent strokes and needs ongoing stroke workup and aggressive risk factor modification.Check TEE for cardiac source of embolism. Aspirin alone for now given Nancy Norris is high risk for bleeding due to her myeloma and chemotherapy. No family available at the bedside for discussion.Discussed with Dr. Erlinda Hong. Greater than 50% time during this 25 minute visit was spent on counseling and coordination of care but embolic strokes and discussion about evaluation and treatment plan and answering questions  Antony Contras, MD Medical Director [place] Pager: [phone] 03/05/2018 3:00 PM  To contact Stroke Continuity provider, please refer to [email or web address] After hours, contact General Neurology

## 2018-03-05 NOTE — Progress Notes (Addendum)
    [facility] has been requested to perform a transesophageal echocardiogram on Nancy Norris for acute stroke.  After careful review of history and examination, the risks and benefits of transesophageal echocardiogram have been explained including risks of esophageal damage, perforation (1:10,000 risk), bleeding, pharyngeal hematoma as well as other potential complications associated with conscious sedation including aspiration, arrhythmia, respiratory failure and death. Alternatives to treatment were discussed, questions were answered. Patient is willing to proceed.   However, patient has pancytopenia (due to multiple myeloma, currently in remission). Hemoglobin 7 and platelets 62 today. Platelets as low as 27 on 02/26/2018. Patient denies any abnormal bleeding but reports progressive dysphagia over the last 9 months. She states it feels like something is stuck in her throat. Discussed with Dr. [name] who was scheduled to perform the procedure - we are going to cancel the procedure. Dr. [name] recommended GI evaluation or barium swallow before proceeding. Will let Neurology and primary team know.   Darreld Mclean, PA-C 03/05/2018 2:26 PM

## 2018-03-05 NOTE — Progress Notes (Signed)
SLP Cancellation Note  Patient Details Name: Nancy Norris MRN: [id number] DOB: 1940-06-30   SLP was contacted by Dr. Erlinda Hong subsequent to completion of the pt's bedside swallow evaluation and the recommendation for the modified barium swallow study was discussed. Dr. Erlinda Hong indicated that the TEE team would like the MBS to be completed prior to the TEE which is scheduled for 03/06/18 at 1400. The pt's MBS has therefore been scheduled for 830 to maximize her NPO time following the study. Dr. Erlinda Hong, the pt's nurse, [name], and the pt (via [name]'s communication) have all been advised of this plan.    Steven Basso I. Hardin Negus, [place], [place] Office number [phone] Pager [place] 03/05/2018, 3:44 PM

## 2018-03-05 NOTE — Progress Notes (Addendum)
PROGRESS NOTE  Nancy Norris WUJ:[id number] DOB: 1940/09/27 DOA: 03/03/2018 PCP: [name], MD  HPI/Recap of past 24 hours:  C/O HEADACHE since lunch time, reports confusion has much improved No active bleeding  Assessment/Plan: Principal Problem:   Stroke [facility]) Active Problems:   HX: breast cancer   Hypothyroid   GERD (gastroesophageal reflux disease)   COPD (chronic obstructive pulmonary disease) with emphysema (HCC)   Multiple myeloma (HCC)   Thrombocytopenia (HCC)   Anemia in neoplastic disease  Acute bilateral multifocal CVA: -patient presented with encaphlopathy/altered mental status -Multifocal bilateral acute ischemia within multiple vascular territories in both hemispheres. This may indicate a central cardiac or aortic embolic source. -MRA "Normal intracranial MRA." -has been sinus rhythm on tele -TEE planned on 2/20, will need venous doppler to r/o DVT if patient has PFO -a1c, ldl pending -No antithrombotic prior to admission, now on aspirin 325 mg daily. Continue aspirin, will follow neurology recommendation  Addendum: patient c/o headache, will get ct head for interval changes  Hypotension:  Continue Midrodrine drain hold diuretic therapy  Pancytopenia  / multiple myeloma  -h/o bone marrow transplant in 2010 -case discussed with oncology Dr Julien Nordmann who states patient is getting prn treatment for MM to preserve renal function, multiple myeloma panel is stable -Dr Julien Nordmann ok with antiplatelet or anticoagulation if plt > 50   Chronic kidney disease stage IV: H/o avf placement in 2015 With a baseline creatinine in 3-2.5 Continue outpatient follow up with nephrology  H/o breast cancer , s/p left mastectomy in 2008  COPD: Seems to be stable On anoro andsingulair  GERD: on carafate and zantac  Dysphagia:  Reports ongoing for several month Will get MBS, dg esophagus  Dry mouth: on pilocarpine  Code Status: DNR  Family Communication: patient     Disposition Plan: need neurology clearance   Consultants:  Neurology  Cardiology  Oncology Dr Julien Nordmann over the phone   Procedures:  TEE planned  Dg esophagus planned   MBS planned  Antibiotics:  none   Objective: BP 125/82 (BP Location: Left Arm)   Pulse 65   Temp 98.2 F (36.8 C) (Oral)   Resp 13   Ht '5\' 5"'$  (1.651 m)   Wt 80.7 kg   SpO2 99%   BMI 29.61 kg/m   Intake/Output Summary (Last 24 hours) at 03/05/2018 1538 Last data filed at 03/04/2018 1820 Gross per 24 hour  Intake 860.32 ml  Output -  Net 860.32 ml   Filed Weights   03/03/18 2012 03/05/18 0700  Weight: 78.5 kg 80.7 kg    Exam: Patient is examined daily including today on 03/05/2018, exams remain the same as of yesterday except that has changed    General:  NAD  Cardiovascular: RRR  Respiratory: CTABL  Abdomen: Soft/ND/NT, positive BS  Musculoskeletal: No Edema  Neuro: alert, oriented   Data Reviewed: Basic Metabolic Panel: Recent Labs  Lab 02/26/18 1636 03/03/18 1243 03/04/18 0500 03/05/18 1106  NA 140 140 141 138  K 3.5 3.5 4.1 3.5  CL 111 110 112* 106  CO2 23 20* 22 20*  GLUCOSE 117* 105* 107* 119*  BUN '18 20 17 18  '$ CREATININE 2.29* 2.40* 2.52* 2.44*  CALCIUM 8.1* 8.3* 8.6* 8.3*   Liver Function Tests: Recent Labs  Lab 02/26/18 1636 03/03/18 1243 03/04/18 0500  AST '15 16 15  '$ ALT '10 9 9  '$ ALKPHOS 74 83 61  BILITOT 0.7 1.1 0.4  PROT 5.1* 5.6* 4.6*  ALBUMIN 3.0* 3.5 2.7*  No results for input(s): LIPASE, AMYLASE in the last 168 hours. Recent Labs  Lab 03/03/18 1341  AMMONIA 10   CBC: Recent Labs  Lab 02/26/18 1636 03/03/18 1243 03/04/18 0500 03/05/18 1106  WBC 3.0* 2.4* 1.7* 2.0*  NEUTROABS 1.3* 1.1*  --  0.8*  HGB 7.9* 8.3* 7.5* 7.0*  HCT 25.3* 26.8* 24.0* 23.0*  MCV 100.4* 104.7* 102.6* 103.1*  PLT 27* 54* 60* 62*   Cardiac Enzymes:   Recent Labs  Lab 02/26/18 1636  CKTOTAL 10*   BNP (last 3 results) Recent Labs    03/13/17 1212  01/20/18 1949  BNP 77 102.6*    ProBNP (last 3 results) No results for input(s): PROBNP in the last 8760 hours.  CBG: No results for input(s): GLUCAP in the last 168 hours.  Recent Results (from the past 240 hour(s))  Urine culture     Status: Abnormal   Collection Time: 02/26/18  4:55 PM  Result Value Ref Range Status   Specimen Description   Final    URINE, RANDOM Performed at [facility] [address], [place], [place] [address]    Special Requests   Final    NONE Performed at [facility], [place] [address], [place], [place] [address]    Culture >=100,000 COLONIES/mL ESCHERICHIA COLI (A)  Final   Report Status 03/01/2018 FINAL  Final   Organism ID, Bacteria ESCHERICHIA COLI (A)  Final      Susceptibility   Escherichia coli - MIC*    AMPICILLIN >=32 RESISTANT Resistant     CEFAZOLIN 8 SENSITIVE Sensitive     CEFTRIAXONE <=1 SENSITIVE Sensitive     CIPROFLOXACIN <=0.25 SENSITIVE Sensitive     GENTAMICIN <=1 SENSITIVE Sensitive     IMIPENEM <=0.25 SENSITIVE Sensitive     NITROFURANTOIN <=16 SENSITIVE Sensitive     TRIMETH/SULFA <=20 SENSITIVE Sensitive     AMPICILLIN/SULBACTAM >=32 RESISTANT Resistant     PIP/TAZO <=4 SENSITIVE Sensitive     Extended ESBL NEGATIVE Sensitive     * >=100,000 COLONIES/mL ESCHERICHIA COLI  Culture, blood (Routine x 2)     Status: None (Preliminary result)   Collection Time: 03/03/18 12:27 PM  Result Value Ref Range Status   Specimen Description   Final    BLOOD PORTA CATH Performed at [facility] [address], [place], [place] [address]    Special Requests   Final    BOTTLES DRAWN AEROBIC AND ANAEROBIC Blood Culture adequate volume Performed at [facility] [address], [place], [place] [address]    Culture   Final    NO GROWTH 2 DAYS Performed at [facility] [address] [place], [place] [address]    Report Status PENDING  Incomplete    Culture, blood (Routine x 2)     Status: None (Preliminary result)   Collection Time: 03/03/18  1:41 PM  Result Value Ref Range Status   Specimen Description   Final    BLOOD RIGHT ANTECUBITAL Performed at [facility], [place] [address] [place], [place] [address]    Special Requests   Final    BOTTLES DRAWN AEROBIC AND ANAEROBIC Blood Culture adequate volume Performed at [facility] [address], [place], [place] [address]    Culture  Setup Time   Final    GRAM POSITIVE COCCI ANAEROBIC BOTTLE ONLY CRITICAL RESULT CALLED TO, READ BACK BY AND VERIFIED WITH: Jerilynn Mages [facility] PHARMD 9629 03/04/18 A  BROWNING    Culture   Final    GRAM POSITIVE COCCI IDENTIFICATION TO FOLLOW Performed at [facility], [place] [address], [place], [place] [address]    Report Status PENDING  Incomplete  Blood Culture ID Panel (Reflexed)     Status: Abnormal   Collection Time: 03/03/18  1:41 PM  Result Value Ref Range Status   Enterococcus species NOT DETECTED NOT DETECTED Final   Listeria monocytogenes NOT DETECTED NOT DETECTED Final   Staphylococcus species DETECTED (A) NOT DETECTED Final    Comment: Methicillin (oxacillin) resistant coagulase negative staphylococcus. Possible blood culture contaminant (unless isolated from more than one blood culture draw or clinical case suggests pathogenicity). No antibiotic treatment is indicated for blood  culture contaminants. CRITICAL RESULT CALLED TO, READ BACK BY AND VERIFIED WITH: Jene Every PHARMD 1840 03/04/18 A BROWNING    Staphylococcus aureus (BCID) NOT DETECTED NOT DETECTED Final   Methicillin resistance DETECTED (A) NOT DETECTED Final    Comment: CRITICAL RESULT CALLED TO, READ BACK BY AND VERIFIED WITH: Jene Every PHARMD 1840 03/04/18 A BROWNING    Streptococcus species NOT DETECTED NOT DETECTED Final   Streptococcus agalactiae NOT DETECTED NOT DETECTED Final   Streptococcus pneumoniae NOT DETECTED NOT DETECTED Final   Streptococcus  pyogenes NOT DETECTED NOT DETECTED Final   Acinetobacter baumannii NOT DETECTED NOT DETECTED Final   Enterobacteriaceae species DETECTED (A) NOT DETECTED Final    Comment: Enterobacteriaceae represent a large family of gram-negative bacteria, not a single organism. CRITICAL RESULT CALLED TO, READ BACK BY AND VERIFIED WITH: Jene Every PHARMD 1840 03/04/18 A BROWNING    Enterobacter cloacae complex NOT DETECTED NOT DETECTED Final   Escherichia coli NOT DETECTED NOT DETECTED Final   Klebsiella oxytoca NOT DETECTED NOT DETECTED Final   Klebsiella pneumoniae NOT DETECTED NOT DETECTED Final   Proteus species DETECTED (A) NOT DETECTED Final    Comment: CRITICAL RESULT CALLED TO, READ BACK BY AND VERIFIED WITH: Jene Every PHARMD 1840 03/04/18 A BROWNING    Serratia marcescens NOT DETECTED NOT DETECTED Final   Carbapenem resistance NOT DETECTED NOT DETECTED Final   Haemophilus influenzae NOT DETECTED NOT DETECTED Final   Neisseria meningitidis NOT DETECTED NOT DETECTED Final   Pseudomonas aeruginosa NOT DETECTED NOT DETECTED Final   Candida albicans NOT DETECTED NOT DETECTED Final   Candida glabrata NOT DETECTED NOT DETECTED Final   Candida krusei NOT DETECTED NOT DETECTED Final   Candida parapsilosis NOT DETECTED NOT DETECTED Final   Candida tropicalis NOT DETECTED NOT DETECTED Final    Comment: Performed at [facility], [place] [address], [place], [place] [address]     Studies: No results found.  Scheduled Meds: .  stroke: mapping our early stages of recovery book   Does not apply Once  . aspirin  325 mg Oral Daily  . buPROPion  75 mg Oral BID  . citalopram  40 mg Oral Daily  . clotrimazole  1 Applicatorful Vaginal QHS  . famotidine  20 mg Oral Daily  . folic acid  1 mg Oral Daily  . gabapentin  600 mg Oral QHS  . levothyroxine  150 mcg Oral Q0600  . loratadine  10 mg Oral Daily  . midodrine  10 mg Oral Daily  . montelukast  10 mg Oral QHS  . pilocarpine  5 mg Oral BID  . sodium  chloride flush  3 mL Intravenous Once  . sucralfate  1 g Oral TID WC & HS  .  vitamin B-12  1,000 mcg Oral Daily    Continuous Infusions: . cefTRIAXone (ROCEPHIN)  IV 2 g (03/04/18 2020)  . [START ON 03/06/2018] vancomycin       Time spent: 67mns I have personally reviewed and interpreted on  03/05/2018 daily labs, tele strips, imagings as discussed above under date review session and assessment and plans.  I reviewed all nursing notes, pharmacy notes, consultant notes,  vitals, pertinent old records  I have discussed plan of care as described above with RN , patient  on 03/05/2018   FFlorencia ReasonsMD, PhD  [facility] Pager [phone] If 7PM-7AM, please contact night-coverage at [email or web address], password [email or web address]2/19/2020, 3:38 PM  LOS: 2 days

## 2018-03-05 NOTE — Progress Notes (Signed)
  Echocardiogram 2D Echocardiogram has been performed.  Johny Chess 03/05/2018, 1:08 PM

## 2018-03-05 NOTE — Progress Notes (Signed)
Physical Therapy Treatment Patient Details Name: Nancy Norris MRN: [id number] DOB: Feb 11, 1940 Today's Date: 03/05/2018    History of Present Illness Patient is a 78 y/o female presents with confusion, amnesia and HA. Head CT- left occipital and left frontal parietial infarcts. MRI- multiple posterior/anterior bilateral infarcts. PMH includes multiple myeloma currently getting chemo, MI, COPD, hypotension, breast ca, fibromyalgia.     PT Comments    Pt very limited this session secondary to increasing headache pain which led to nausea and dry heaving. [name] RN was notified.  All VSS throughout. Pt is scheduled for a MBS and a TEE tomorrow (03/06/18). PT will continue to follow acutely to progress mobility as tolerated.   All VSS throughout.     Follow Up Recommendations  CIR;Supervision for mobility/OOB     Equipment Recommendations  None recommended by PT    Recommendations for Other Services       Precautions / Restrictions Precautions Precautions: Fall Restrictions Weight Bearing Restrictions: No    Mobility  Bed Mobility Overal bed mobility: Needs Assistance Bed Mobility: Supine to Sit     Supine to sit: Supervision;HOB elevated     General bed mobility comments: No assist needed.   Transfers Overall transfer level: Needs assistance Equipment used: None Transfers: Sit to/from Stand Sit to Stand: Min assist         General transfer comment: min A for stability with transition secondary to +dizziness  Ambulation/Gait             General Gait Details: pt unable to tolerate this session secondary to increasing HA pain which caused nausea and dry heaving   Stairs             Wheelchair Mobility    Modified Rankin (Stroke Patients Only) Modified Rankin (Stroke Patients Only) Pre-Morbid Rankin Score: Slight disability Modified Rankin: Moderately severe disability     Balance Overall balance assessment: Needs assistance Sitting-balance  support: Feet supported;No upper extremity supported Sitting balance-Leahy Scale: Fair     Standing balance support: During functional activity Standing balance-Leahy Scale: Poor                              Cognition Arousal/Alertness: Awake/alert Behavior During Therapy: Flat affect Overall Cognitive Status: Impaired/Different from baseline Area of Impairment: Safety/judgement                         Safety/Judgement: Decreased awareness of deficits;Decreased awareness of safety     General Comments: difficult to accurately assess as pt very focused on HA pain and became very nauseous      Exercises      General Comments        Pertinent Vitals/Pain Pain Assessment: Faces Faces Pain Scale: Hurts even more Pain Location: headache Pain Descriptors / Indicators: Headache Pain Intervention(s): Monitored during session;Repositioned;Patient requesting pain meds-RN notified    Home Living                      Prior Function            PT Goals (current goals can now be found in the care plan section) Acute Rehab PT Goals PT Goal Formulation: With patient Time For Goal Achievement: 03/18/18 Potential to Achieve Goals: Good Progress towards PT goals: Progressing toward goals    Frequency    Min 4X/week      PT Plan Current plan  remains appropriate    Co-evaluation              AM-PAC PT "6 Clicks" Mobility   Outcome Measure  Help needed turning from your back to your side while in a flat bed without using bedrails?: None Help needed moving from lying on your back to sitting on the side of a flat bed without using bedrails?: None Help needed moving to and from a bed to a chair (including a wheelchair)?: A Little Help needed standing up from a chair using your arms (e.g., wheelchair or bedside chair)?: A Little Help needed to walk in hospital room?: A Little Help needed climbing 3-5 steps with a railing? : A Lot 6 Click  Score: 19    End of Session   Activity Tolerance: Patient limited by pain;Other (comment)(limited secondary to nausea) Patient left: in bed;with call bell/phone within reach;with bed alarm set;Other (comment)(sitting EOB) Nurse Communication: Mobility status;Patient requests pain meds;Other (comment)(pt nauseated) PT Visit Diagnosis: Difficulty in walking, not elsewhere classified (R26.2);Unsteadiness on feet (R26.81)     Time: 6754-4920 PT Time Calculation (min) (ACUTE ONLY): 19 min  Charges:  $Therapeutic Activity: 8-22 mins                     Sherie Don, PT, DPT  Acute Rehabilitation Services Pager [phone] Office [place] 03/05/2018, 4:08 PM

## 2018-03-06 ENCOUNTER — Inpatient Hospital Stay: Payer: Medicare Other

## 2018-03-06 ENCOUNTER — Inpatient Hospital Stay (HOSPITAL_COMMUNITY): Payer: Medicare Other

## 2018-03-06 DIAGNOSIS — D649 Anemia, unspecified: Secondary | ICD-10-CM

## 2018-03-06 DIAGNOSIS — J438 Other emphysema: Secondary | ICD-10-CM

## 2018-03-06 DIAGNOSIS — Z853 Personal history of malignant neoplasm of breast: Secondary | ICD-10-CM

## 2018-03-06 LAB — LIPID PANEL
CHOL/HDL RATIO: 5.4 ratio
Cholesterol: 141 mg/dL (ref 0–200)
HDL: 26 mg/dL — ABNORMAL LOW (ref >40)
LDL Cholesterol: 85 mg/dL (ref 0–99)
Triglycerides: 151 mg/dL — ABNORMAL HIGH (ref <150)
VLDL: 30 mg/dL (ref 0–40)

## 2018-03-06 LAB — PROTEIN C, TOTAL: PROTEIN C, TOTAL: 99 % (ref 60–150)

## 2018-03-06 LAB — CBC WITH DIFFERENTIAL/PLATELET
Abs Immature Granulocytes: 0.01 10*3/uL (ref 0.00–0.07)
Basophils Absolute: 0 10*3/uL (ref 0.0–0.1)
Basophils Relative: 1 %
EOS PCT: 9 %
Eosinophils Absolute: 0.2 10*3/uL (ref 0.0–0.5)
HEMATOCRIT: 22.1 % — AB (ref 36.0–46.0)
Hemoglobin: 7.2 g/dL — ABNORMAL LOW (ref 12.0–15.0)
Immature Granulocytes: 1 %
Lymphocytes Relative: 26 %
Lymphs Abs: 0.5 10*3/uL — ABNORMAL LOW (ref 0.7–4.0)
MCH: 33 pg (ref 26.0–34.0)
MCHC: 32.6 g/dL (ref 30.0–36.0)
MCV: 101.4 fL — ABNORMAL HIGH (ref 80.0–100.0)
Monocytes Absolute: 0.5 10*3/uL (ref 0.1–1.0)
Monocytes Relative: 23 %
Neutro Abs: 0.8 10*3/uL — ABNORMAL LOW (ref 1.7–7.7)
Neutrophils Relative %: 40 %
Platelets: 66 10*3/uL — ABNORMAL LOW (ref 150–400)
RBC: 2.18 MIL/uL — ABNORMAL LOW (ref 3.87–5.11)
RDW: 24.1 % — ABNORMAL HIGH (ref 11.5–15.5)
WBC: 2 10*3/uL — ABNORMAL LOW (ref 4.0–10.5)
nRBC: 0 % (ref 0.0–0.2)

## 2018-03-06 LAB — BASIC METABOLIC PANEL
Anion gap: 12 (ref 5–15)
BUN: 17 mg/dL (ref 8–23)
CO2: 22 mmol/L (ref 22–32)
Calcium: 6.1 mg/dL — CL (ref 8.9–10.3)
Chloride: 106 mmol/L (ref 98–111)
Creatinine, Ser: 2.56 mg/dL — ABNORMAL HIGH (ref 0.44–1.00)
GFR calc Af Amer: 20 mL/min — ABNORMAL LOW (ref >60)
GFR calc non Af Amer: 18 mL/min — ABNORMAL LOW (ref >60)
Glucose, Bld: 100 mg/dL — ABNORMAL HIGH (ref 70–99)
Potassium: 4.8 mmol/L (ref 3.5–5.1)
Sodium: 140 mmol/L (ref 135–145)

## 2018-03-06 LAB — PROTEIN S ACTIVITY: PROTEIN S ACTIVITY: 40 % — AB (ref 63–140)

## 2018-03-06 LAB — CARDIOLIPIN ANTIBODIES, IGG, IGM, IGA
Anticardiolipin IgA: <9 APL U/mL (ref 0–11)
Anticardiolipin IgG: <9 GPL U/mL (ref 0–14)
Anticardiolipin IgM: <9 MPL U/mL (ref 0–12)

## 2018-03-06 LAB — OCCULT BLOOD X 1 CARD TO LAB, STOOL: Fecal Occult Bld: NEGATIVE

## 2018-03-06 LAB — PROTEIN C ACTIVITY: PROTEIN C ACTIVITY: 107 % (ref 73–180)

## 2018-03-06 LAB — LUPUS ANTICOAGULANT PANEL
DRVVT: 42.4 s (ref 0.0–47.0)
PTT Lupus Anticoagulant: 47.3 s (ref 0.0–51.9)

## 2018-03-06 LAB — PROTEIN S, TOTAL: Protein S Ag, Total: 111 % (ref 60–150)

## 2018-03-06 LAB — IGG, IGA, IGM
IgA: 9 mg/dL — ABNORMAL LOW (ref 64–422)
IgG (Immunoglobin G), Serum: 169 mg/dL — ABNORMAL LOW (ref 700–1600)
IgM (Immunoglobulin M), Srm: <5 mg/dL — ABNORMAL LOW (ref 26–217)

## 2018-03-06 MED ORDER — CALCIUM GLUCONATE-NACL 1-0.675 GM/50ML-% IV SOLN
1.0000 g | Freq: Once | INTRAVENOUS | Status: AC
  Administered 2018-03-06: 1000 mg via INTRAVENOUS
  Filled 2018-03-06: qty 50

## 2018-03-06 MED ORDER — ATORVASTATIN CALCIUM 10 MG PO TABS
20.0000 mg | ORAL_TABLET | Freq: Every day | ORAL | Status: DC
  Administered 2018-03-06 – 2018-03-10 (×4): 20 mg via ORAL
  Filled 2018-03-06 (×5): qty 2

## 2018-03-06 NOTE — Plan of Care (Signed)
Patient was able to brush teeth and wash face on her own during morning care. Nurse stood by for support.

## 2018-03-06 NOTE — Progress Notes (Signed)
Patient TEE rescheduled for 03/07/2018 per Dr. [name] due to patient having barium swallow this AM and unable to perform TEE same day following this.

## 2018-03-06 NOTE — Progress Notes (Addendum)
PROGRESS NOTE  Nancy Norris QQP:[id number] DOB: 1940-09-02 DOA: 03/03/2018 PCP: [name], MD  HPI/Recap of past 24 hours:    Returned from swallow study and dg esophagus Denies pain today, she does reports ongoing dysphagia, ab pain when she eats in the last few months, she denies n/v, states stool is brown She reports was recently referred to [facility] by her oncologist  due to anemia  Denies headache, she is aaox3 today   Assessment/Plan: Principal Problem:   Stroke [facility]) Active Problems:   HX: breast cancer   Hypothyroid   GERD (gastroesophageal reflux disease)   COPD (chronic obstructive pulmonary disease) with emphysema (HCC)   Multiple myeloma ([place])   Thrombocytopenia ([place])   Anemia in neoplastic disease   Dysphagia   CKD (chronic kidney disease), stage IV ([place])  Acute bilateral multifocal CVA: -patient presented with encaphlopathy/altered mental status -Multifocal bilateral acute ischemia within multiple vascular territories in both hemispheres. This may indicate a central cardiac or aortic embolic source. -MRA "Normal intracranial MRA." -has been sinus rhythm on tele -TEE planned on 2/21, will need venous doppler to r/o DVT if patient has PFO -a1c, ldl 85 -No antithrombotic prior to admission, now on aspirin 325 mg daily. Continue aspirin, will follow neurology recommendation  Addendum: patient c/o headache on 2/19, ct head no acute interval changes  Chronic Hypotension:  Continue home meds  Midrodrine,  hold home meds lasix  Pancytopenia  / multiple myeloma  -h/o bone marrow transplant in 2010 -case discussed with oncology Dr Julien Nordmann who states patient is getting prn treatment for MM to preserve renal function, multiple myeloma panel is stable -Dr Julien Nordmann ok with antiplatelet or anticoagulation if plt > 50   Chronic kidney disease stage IV: H/o avf placement in 2015 With a baseline creatinine in 3-2.5 Continue outpatient follow up with  nephrology  H/o breast cancer , s/p left mastectomy in 2008  COPD: Seems to be stable On anoro andsingulair  GERD: on carafate and zantac  Dysphagia:  Reports ongoing for several month, MBS unremarkable, dg esophagus concerning for distal esophageal ulcer vs tumor She did reports ab pain when she eats [facility] consulted , will plan to have EGD during this hospitalization  Dry mouth: on pilocarpine  Hypocalcemia: s/p calcium gluconate , repeat lab in am  Code Status: DNR  Family Communication: patient   Disposition Plan: need neurology clearance   Consultants:  Neurology  Cardiology  Oncology Dr Julien Nordmann over the phone   [facility]  Procedures:  TEE planned  Dg esophagus on 2/20  MBS planned on 2/20  Antibiotics:  none   Objective: BP 113/66 (BP Location: Right Arm)   Pulse 79   Temp 98.2 F (36.8 C) (Oral)   Resp 10   Ht _0  (1.651 m)   Wt 79.3 kg   SpO2 97%   BMI 29.09 kg/m   Intake/Output Summary (Last 24 hours) at 03/06/2018 0750 Last data filed at 03/06/2018 0600 Gross per 24 hour  Intake 550 ml  Output -  Net 550 ml   Filed Weights   03/03/18 2012 03/05/18 0700 03/06/18 0500  Weight: 78.5 kg 80.7 kg 79.3 kg    Exam: Patient is examined daily including today on 03/06/2018, exams remain the same as of yesterday except that has changed    General:  NAD, pale  Cardiovascular: RRR  Respiratory: CTABL  Abdomen: Soft/ND/NT, positive BS  Musculoskeletal: No Edema  Neuro: alert, oriented   Data Reviewed: Basic  Metabolic Panel: Recent Labs  Lab 03/03/18 1243 03/04/18 0500 03/05/18 1106 03/06/18 0500  NA 140 141 138 140  K 3.5 4.1 3.5 4.8  CL 110 112* 106 106  CO2 20* 22 20* 22  GLUCOSE 105* 107* 119* 100*  BUN _0 CREATININE 2.40* 2.52* 2.44* 2.56*  CALCIUM 8.3* 8.6* 8.3* 6.1*   Liver Function Tests: Recent Labs  Lab 03/03/18 1243 03/04/18 0500  AST 16 15  ALT 9 9  ALKPHOS 83 61  BILITOT 1.1 0.4  PROT  5.6* 4.6*  ALBUMIN 3.5 2.7*   No results for input(s): LIPASE, AMYLASE in the last 168 hours. Recent Labs  Lab 03/03/18 1341  AMMONIA 10   CBC: Recent Labs  Lab 03/03/18 1243 03/04/18 0500 03/05/18 1106 03/06/18 0500  WBC 2.4* 1.7* 2.0* 2.0*  NEUTROABS 1.1*  --  0.8* 0.8*  HGB 8.3* 7.5* 7.0* 7.2*  HCT 26.8* 24.0* 23.0* 22.1*  MCV 104.7* 102.6* 103.1* 101.4*  PLT 54* 60* 62* 66*   Cardiac Enzymes:   No results for input(s): CKTOTAL, CKMB, CKMBINDEX, TROPONINI in the last 168 hours. BNP (last 3 results) Recent Labs    03/13/17 1212 01/20/18 1949  BNP 77 102.6*    ProBNP (last 3 results) No results for input(s): PROBNP in the last 8760 hours.  CBG: No results for input(s): GLUCAP in the last 168 hours.  Recent Results (from the past 240 hour(s))  Urine culture     Status: Abnormal   Collection Time: 02/26/18  4:55 PM  Result Value Ref Range Status   Specimen Description   Final    URINE, RANDOM Performed at [facility] [address], [place], [place] [address]    Special Requests   Final    NONE Performed at [facility], [place] [address], [place], [place] [address]    Culture >=100,000 COLONIES/mL ESCHERICHIA COLI (A)  Final   Report Status 03/01/2018 FINAL  Final   Organism ID, Bacteria ESCHERICHIA COLI (A)  Final      Susceptibility   Escherichia coli - MIC*    AMPICILLIN >=32 RESISTANT Resistant     CEFAZOLIN 8 SENSITIVE Sensitive     CEFTRIAXONE <=1 SENSITIVE Sensitive     CIPROFLOXACIN <=0.25 SENSITIVE Sensitive     GENTAMICIN <=1 SENSITIVE Sensitive     IMIPENEM <=0.25 SENSITIVE Sensitive     NITROFURANTOIN <=16 SENSITIVE Sensitive     TRIMETH/SULFA <=20 SENSITIVE Sensitive     AMPICILLIN/SULBACTAM >=32 RESISTANT Resistant     PIP/TAZO <=4 SENSITIVE Sensitive     Extended ESBL NEGATIVE Sensitive     * >=100,000 COLONIES/mL ESCHERICHIA COLI  Culture, blood (Routine x 2)     Status: None (Preliminary result)     Collection Time: 03/03/18 12:27 PM  Result Value Ref Range Status   Specimen Description   Final    BLOOD PORTA CATH Performed at [facility] [address], [place], [place] [address]    Special Requests   Final    BOTTLES DRAWN AEROBIC AND ANAEROBIC Blood Culture adequate volume Performed at [facility] [address] [place], [place] [address]    Culture   Final    NO GROWTH 2 DAYS Performed at [facility] [address] [place], [place] [address]    Report Status PENDING  Incomplete  Culture, blood (Routine x 2)     Status: None (Preliminary result)   Collection Time: 03/03/18  1:41 PM  Result Value Ref Range Status   Specimen Description   Final    BLOOD RIGHT ANTECUBITAL Performed at [facility], [place] [address], [place], [place] [address]    Special Requests   Final    BOTTLES DRAWN AEROBIC AND ANAEROBIC Blood Culture adequate volume Performed at [facility] [address] [place], [place] [address]    Culture  Setup Time   Final    GRAM POSITIVE COCCI ANAEROBIC BOTTLE ONLY CRITICAL RESULT CALLED TO, READ BACK BY AND VERIFIED WITH: Jene Every PHARMD 1840 03/04/18 A BROWNING    Culture   Final    GRAM POSITIVE COCCI IDENTIFICATION TO FOLLOW Performed at [facility], [place] [address], [place], [place] [address]    Report Status PENDING  Incomplete  Blood Culture ID Panel (Reflexed)     Status: Abnormal   Collection Time: 03/03/18  1:41 PM  Result Value Ref Range Status   Enterococcus species NOT DETECTED NOT DETECTED Final   Listeria monocytogenes NOT DETECTED NOT DETECTED Final   Staphylococcus species DETECTED (A) NOT DETECTED Final    Comment: Methicillin (oxacillin) resistant coagulase negative staphylococcus. Possible blood culture contaminant (unless isolated from more than one blood culture draw or clinical case suggests pathogenicity). No antibiotic treatment is indicated for  blood  culture contaminants. CRITICAL RESULT CALLED TO, READ BACK BY AND VERIFIED WITH: Jene Every PHARMD 1840 03/04/18 A BROWNING    Staphylococcus aureus (BCID) NOT DETECTED NOT DETECTED Final   Methicillin resistance DETECTED (A) NOT DETECTED Final    Comment: CRITICAL RESULT CALLED TO, READ BACK BY AND VERIFIED WITH: Jene Every PHARMD 1840 03/04/18 A BROWNING    Streptococcus species NOT DETECTED NOT DETECTED Final   Streptococcus agalactiae NOT DETECTED NOT DETECTED Final   Streptococcus pneumoniae NOT DETECTED NOT DETECTED Final   Streptococcus pyogenes NOT DETECTED NOT DETECTED Final   Acinetobacter baumannii NOT DETECTED NOT DETECTED Final   Enterobacteriaceae species DETECTED (A) NOT DETECTED Final    Comment: Enterobacteriaceae represent a large family of gram-negative bacteria, not a single organism. CRITICAL RESULT CALLED TO, READ BACK BY AND VERIFIED WITH: Jene Every PHARMD 1840 03/04/18 A BROWNING    Enterobacter cloacae complex NOT DETECTED NOT DETECTED Final   Escherichia coli NOT DETECTED NOT DETECTED Final   Klebsiella oxytoca NOT DETECTED NOT DETECTED Final   Klebsiella pneumoniae NOT DETECTED NOT DETECTED Final   Proteus species DETECTED (A) NOT DETECTED Final    Comment: CRITICAL RESULT CALLED TO, READ BACK BY AND VERIFIED WITH: Jene Every PHARMD 1840 03/04/18 A BROWNING    Serratia marcescens NOT DETECTED NOT DETECTED Final   Carbapenem resistance NOT DETECTED NOT DETECTED Final   Haemophilus influenzae NOT DETECTED NOT DETECTED Final   Neisseria meningitidis NOT DETECTED NOT DETECTED Final   Pseudomonas aeruginosa NOT DETECTED NOT DETECTED Final   Candida albicans NOT DETECTED NOT DETECTED Final   Candida glabrata NOT DETECTED NOT DETECTED Final   Candida krusei NOT DETECTED NOT DETECTED Final   Candida parapsilosis NOT DETECTED NOT DETECTED Final   Candida tropicalis NOT DETECTED NOT DETECTED Final    Comment: Performed at [facility], [place] [address]  [place], [place] [address]     Studies: Ct Head Wo Contrast  Result Date: 03/05/2018 CLINICAL DATA:  Headache beginning this afternoon. History of multiple myeloma, thrombocytopenia and stroke. EXAM: CT HEAD WITHOUT CONTRAST TECHNIQUE: Contiguous axial images were obtained from the base of the skull through  the vertex without intravenous contrast. COMPARISON:  MRI of the head March 03, 2018 and CT HEAD March 03, 2018. FINDINGS: BRAIN: Patchy LEFT > RIGHT parietal and LEFT occipital hypodensities are increasingly conspicuous from prior CT. No propagation. No intraparenchymal hemorrhage, mass effect or midline shift. Patchy LEFT frontal white matter hypodensities most compatible with chronic small vessel ischemic changes. Old small LEFT cerebellar infarct. No parenchymal brain volume loss for age. No hydrocephalus. No abnormal extra-axial fluid collections. VASCULAR: Moderate calcific atherosclerosis of the carotid siphons. SKULL: No skull fracture. Moderate temporomandibular osteoarthrosis. No significant scalp soft tissue swelling. SINUSES/ORBITS: Trace paranasal sinus mucosal thickening. Mastoid air cells are well aerated.The included ocular globes and orbital contents are non-suspicious. Status post bilateral ocular lens implants. OTHER: None. IMPRESSION: 1. Involving acute LEFT > RIGHT parietal and LEFT occipital lobe infarcts without hemorrhagic conversion. 2. Mild-to-moderate chronic small vessel ischemic changes. Old small LEFT cerebellar infarct. Electronically Signed   By: Elon Alas M.D.   On: 03/05/2018 17:25    Scheduled Meds: .  stroke: mapping our early stages of recovery book   Does not apply Once  . sodium chloride  250 mL Intravenous Once  . aspirin  325 mg Oral Daily  . buPROPion  75 mg Oral BID  . citalopram  40 mg Oral Daily  . clotrimazole  1 Applicatorful Vaginal QHS  . famotidine  20 mg Oral Daily  . folic acid  1 mg Oral Daily  . gabapentin  600 mg Oral QHS  .  levothyroxine  150 mcg Oral Q0600  . loratadine  10 mg Oral Daily  . midodrine  10 mg Oral Daily  . montelukast  10 mg Oral QHS  . pilocarpine  5 mg Oral BID  . sodium chloride flush  3 mL Intravenous Once  . sucralfate  1 g Oral TID WC & HS  . vitamin B-12  1,000 mcg Oral Daily    Continuous Infusions: . calcium gluconate    . cefTRIAXone (ROCEPHIN)  IV Stopped (03/05/18 1920)  . vancomycin       Time spent: 81mns, case discussed with [facility] Dr GPenelope Coop I have personally reviewed and interpreted on  03/06/2018 daily labs, tele strips, imagings as discussed above under date review session and assessment and plans.  I reviewed all nursing notes, pharmacy notes, consultant notes,  vitals, pertinent old records  I have discussed plan of care as described above with RN , patient  on 03/06/2018   FFlorencia ReasonsMD, PhD  [facility] Pager [phone] If 7PM-7AM, please contact night-coverage at [email or web address], password [email or web address]2/20/2020, 7:50 AM  LOS: 3 days

## 2018-03-06 NOTE — Progress Notes (Signed)
STROKE TEAM PROGRESS NOTE   INTERVAL HISTORY No family is at the bedside.  TEE is pending. . Transthoracic echo was unremarkable.  Vitals:   03/06/18 0324 03/06/18 0500 03/06/18 0624 03/06/18 1124  BP: 113/66   (!) 147/79  Pulse:    74  Resp:   10 13  Temp: 98.2 F (36.8 C)   98.5 F (36.9 C)  TempSrc: Oral   Oral  SpO2:    98%  Weight:  79.3 kg    Height:        CBC:  Recent Labs  Lab 03/05/18 1106 03/06/18 0500  WBC 2.0* 2.0*  NEUTROABS 0.8* 0.8*  HGB 7.0* 7.2*  HCT 23.0* 22.1*  MCV 103.1* 101.4*  PLT 62* 66*    Basic Metabolic Panel:  Recent Labs  Lab 03/05/18 1106 03/06/18 0500  NA 138 140  K 3.5 4.8  CL 106 106  CO2 20* 22  GLUCOSE 119* 100*  BUN 18 17  CREATININE 2.44* 2.56*  CALCIUM 8.3* 6.1*   Lipid Panel:     Component Value Date/Time   CHOL 141 03/06/2018 0500   TRIG 151 (H) 03/06/2018 0500   HDL 26 (L) 03/06/2018 0500   CHOLHDL 5.4 03/06/2018 0500   VLDL 30 03/06/2018 0500   LDLCALC 85 03/06/2018 0500   LDLCALC 128 (H) 12/18/2017 1137   HgbA1c:  Lab Results  Component Value Date   HGBA1C 5.1 09/13/2017   Urine Drug Screen: No results found for: LABOPIA, COCAINSCRNUR, LABBENZ, AMPHETMU, THCU, LABBARB  Alcohol Level     Component Value Date/Time   ETH <10 03/03/2018 1244    IMAGING Ct Head Wo Contrast  Result Date: 03/05/2018 CLINICAL DATA:  Headache beginning this afternoon. History of multiple myeloma, thrombocytopenia and stroke. EXAM: CT HEAD WITHOUT CONTRAST TECHNIQUE: Contiguous axial images were obtained from the base of the skull through the vertex without intravenous contrast. COMPARISON:  MRI of the head March 03, 2018 and CT HEAD March 03, 2018. FINDINGS: BRAIN: Patchy LEFT > RIGHT parietal and LEFT occipital hypodensities are increasingly conspicuous from prior CT. No propagation. No intraparenchymal hemorrhage, mass effect or midline shift. Patchy LEFT frontal white matter hypodensities most compatible with chronic  small vessel ischemic changes. Old small LEFT cerebellar infarct. No parenchymal brain volume loss for age. No hydrocephalus. No abnormal extra-axial fluid collections. VASCULAR: Moderate calcific atherosclerosis of the carotid siphons. SKULL: No skull fracture. Moderate temporomandibular osteoarthrosis. No significant scalp soft tissue swelling. SINUSES/ORBITS: Trace paranasal sinus mucosal thickening. Mastoid air cells are well aerated.The included ocular globes and orbital contents are non-suspicious. Status post bilateral ocular lens implants. OTHER: None. IMPRESSION: 1. Involving acute LEFT > RIGHT parietal and LEFT occipital lobe infarcts without hemorrhagic conversion. 2. Mild-to-moderate chronic small vessel ischemic changes. Old small LEFT cerebellar infarct. Electronically Signed   By: Elon Alas M.D.   On: 03/05/2018 17:25   Dg Swallowing Func-speech Pathology  Result Date: 03/06/2018 Objective Swallowing Evaluation: Type of Study: MBS-Modified Barium Swallow Study  Patient Details Name: DINARA LUPU MRN: [id number] Date of Birth: 1940-04-07 Today's Date: 03/06/2018 Time: SLP Start Time (ACUTE ONLY): 0825 -SLP Stop Time (ACUTE ONLY): 0840 SLP Time Calculation (min) (ACUTE ONLY): 15 min Past Medical History: Past Medical History: Diagnosis Date . Anemia  . Anginal pain ([place])   used NTG x 2 May 31 and 06/15/13  . Anxiety  . Arthritis  . B12 deficiency 12/04/2014 . Breast cancer ([place])  . Complication of anesthesia  . COPD (chronic obstructive  pulmonary disease) ([place])  . Cryptococcal pneumonitis ([place]) 11/22/2015 . Depression  . Dizziness  . Dyspnea  . Fibromyalgia  . Fibromyalgia  . GERD (gastroesophageal reflux disease)  . Headache(784.0)  . Heart murmur  . Hemoptysis 10/21/2015 . History of blood transfusion   last one May 12  . Hx of cardiovascular stress test   LexiScan with low level exercise Myoview (02/2013): No ischemia, EF 72%; normal study . Hx of echocardiogram   a.  Echocardiogram  (12/26/2012): EF 16-94%, grade 1 diastolic dysfunction;   b.  Echocardiogram (02/2013): EF 55-60%, no WMA, trivial effusion . Hyperkalemia  . Hyponatremia  . Hypotension  . Hypothyroidism  . Mucositis  . Multiple myeloma  . Myocardial infarction [facility])   in past, patient was unaware.  . Neuropathy  . Nodule of left lung 09/13/2015 . Pain in joint, pelvic region and thigh 07/07/2015 . Pneumonia   several . PONV (postoperative nausea and vomiting) 2008  after mastestomy Past Surgical History: Past Surgical History: Procedure Laterality Date . ABDOMINAL HYSTERECTOMY  1981 . AV FISTULA PLACEMENT Left 06/19/2013  Procedure: CREATION OF LEFT ARM ARTERIOVENOUS (AV) FISTULA ;  Surgeon: Angelia Mould, MD;  Location: [place];  Service: Vascular;  Laterality: Left; . BREAST RECONSTRUCTION   . BREAST SURGERY Right   reduction . CATARACT EXTRACTION, BILATERAL   . CHOLECYSTECTOMY  1971 . COLONOSCOPY   . EYE SURGERY Bilateral   lens implant . history of Port removal   . LUNG BIOPSY  10/21/2015 . MASTECTOMY Left 2008 . PORTACATH PLACEMENT  12/2012  has had 2 . RESECTION OF ARTERIOVENOUS FISTULA ANEURYSM Left 06/04/2017  Procedure: LIGATION ANEURYSM OF LEFT ARTERIOVENOUS FISTULA;  Surgeon: Angelia Mould, MD;  Location: [place];  Service: Vascular;  Laterality: Left; . Status post stem cell transplant on September 28, 2008.   HPI: Patient is a 78 y/o female presents with confusion, amnesia and HA. Head CT- left occipital and left frontal parietial infarcts. MRI- multiple posterior/anterior bilateral infarcts. PMH includes multiple myeloma currently getting chemo, MI, COPD, hypotension, breast ca, fibromyalgia.  Subjective: pleasant Assessment / Plan / Recommendation CHL IP CLINICAL IMPRESSIONS 03/06/2018 Clinical Impression Pt demonstrated reduced lingual retraction which resulted in mild vallecular residue and transient penetration (PAS 2) with thin liquids when consecutive swallows were used. [name] independent use of dry  secondary swallows was effective in reducing the residue and residue was eliminated with use of aliquid wash. Overall, her swallow mechanism appears to be within functional limits with consideration of her age and she does not appear to be at significant risk of aspiration at this time. It is recommended that a regular texture diet be continued but, with consideration of her reported xerostomia, liquid washes may be beneficial to improve bolus flow. SLP will continue to follow for cognitive-linguisitic treatment and will see the pt once more for swallowing to ensure her observance of compensatory strategies. SLP Visit Diagnosis Dysphagia, unspecified (R13.10) Attention and concentration deficit following -- Frontal lobe and executive function deficit following -- Impact on safety and function No limitations   CHL IP TREATMENT RECOMMENDATION 03/06/2018 Treatment Recommendations Therapy as outlined in treatment plan below   Prognosis 03/06/2018 Prognosis for Safe Diet Advancement Good Barriers to Reach Goals Cognitive deficits Barriers/Prognosis Comment -- CHL IP DIET RECOMMENDATION 03/06/2018 SLP Diet Recommendations Regular solids;Thin liquid Liquid Administration via Cup;Straw Medication Administration Whole meds with liquid Compensations Small sips/bites;Slow rate;Follow solids with liquid Postural Changes Remain semi-upright after after feeds/meals (Comment);Seated upright at 90 degrees  No flowsheet data found.  CHL IP FOLLOW UP RECOMMENDATIONS 03/06/2018 Follow up Recommendations Inpatient Rehab   CHL IP FREQUENCY AND DURATION 03/06/2018 Speech Therapy Frequency (ACUTE ONLY) min 2x/week Treatment Duration 2 weeks      CHL IP ORAL PHASE 03/06/2018 Oral Phase WFL Oral - Pudding Teaspoon -- Oral - Pudding Cup -- Oral - Honey Teaspoon -- Oral - Honey Cup -- Oral - Nectar Teaspoon -- Oral - Nectar Cup -- Oral - Nectar Straw -- Oral - Thin Teaspoon -- Oral - Thin Cup -- Oral - Thin Straw -- Oral - Puree -- Oral - Mech  Soft -- Oral - Regular -- Oral - Multi-Consistency -- Oral - Pill -- Oral Phase - Comment --  CHL IP PHARYNGEAL PHASE 03/06/2018 Pharyngeal Phase WFL Pharyngeal- Pudding Teaspoon -- Pharyngeal -- Pharyngeal- Pudding Cup -- Pharyngeal -- Pharyngeal- Honey Teaspoon -- Pharyngeal -- Pharyngeal- Honey Cup -- Pharyngeal -- Pharyngeal- Nectar Teaspoon -- Pharyngeal -- Pharyngeal- Nectar Cup -- Pharyngeal -- Pharyngeal- Nectar Straw -- Pharyngeal -- Pharyngeal- Thin Teaspoon -- Pharyngeal -- Pharyngeal- Thin Cup Penetration/Aspiration during swallow Pharyngeal Material enters airway, remains ABOVE vocal cords then ejected out Pharyngeal- Thin Straw Penetration/Aspiration during swallow Pharyngeal Material enters airway, remains ABOVE vocal cords then ejected out Pharyngeal- Puree Penetration/Aspiration before swallow;Penetration/Aspiration during swallow;Pharyngeal residue - valleculae Pharyngeal Material enters airway, remains ABOVE vocal cords then ejected out Pharyngeal- Mechanical Soft -- Pharyngeal -- Pharyngeal- Regular Pharyngeal residue - valleculae Pharyngeal -- Pharyngeal- Multi-consistency -- Pharyngeal -- Pharyngeal- Pill -- Pharyngeal -- Pharyngeal Comment --  Shanika I. Hardin Negus, [place], [place] Office number [phone] Pager [place] 03/06/2018, 9:42 AM              Dg Esophagus W Single Cm (sol Or Thin Ba)  Result Date: 03/06/2018 CLINICAL DATA:  Dysphagia EXAM: ESOPHOGRAM/BARIUM SWALLOW TECHNIQUE: Single contrast examination was performed using thick and thin barium FLUOROSCOPY TIME:  Fluoroscopy Time:  1 minutes, 48 seconds Radiation Exposure Index (if provided by the fluoroscopic device): 17.9 mGy Number of Acquired Spot Images: None COMPARISON:  CT chest from 01/20/2018 FINDINGS: The patient was relatively frail, and I elected not to give gas crystals. With patient laying down, swallows were performed in the LPO position. Mild distal esophageal fold  thickening. No obvious ulceration. Mildly dilated esophagus. Primary peristaltic waves in the esophagus were disrupted on 4/4 swallows in the mid esophagus level. Initial swallows demonstrate some lobularity and irregularity along the upper margin of the gastroesophageal junction for example on image 35/1. Tumor or ulceration not readily excluded given this appearance. In the LPO position, there is poor filling of the gastroesophageal junction, with only slow percolated shin of contrast from the dilated esophagus into the stomach. A 13 mm barium tablet passed without difficulty into the stomach. Limited assessment of the upper stomach demonstrates no appreciable gastric diverticulum along the right margin of the upper stomach, raising concern that the rounded density in this vicinity on the recent chest CT from 01/20/2018 measuring 3.6 by 2.6 cm on that exam probably represents gastrohepatic ligament pathologic adenopathy rather than a diverticulum. IMPRESSION: 1. Nonspecific esophageal dysmotility disorder with disruption of primary peristaltic waves in the mid esophagus. 2. Distal esophageal irregularity potentially with ulceration. Difficult to exclude tumor of the distal esophagus. Endoscopy is recommended. 3. There is no appreciable upper gastric diverticulum, and based on this I suspect that the density along the right wall of the proximal stomach on recent chest CT of 01/20/2018 is actually  due to new pathologic adenopathy/tumor in the gastrohepatic ligament, rather than a diverticulum. Electronically Signed   By: Van Clines M.D.   On: 03/06/2018 09:31    PHYSICAL EXAM  pleasant elderly Caucasian lady currently not in distress. . Afebrile. Head is nontraumatic. Neck is supple without bruit.    Cardiac exam no murmur or gallop. Lungs are clear to auscultation. Distal pulses are well felt.she has a Port-A-Cath in the right infraclavicular region Neurological Exam ;  Awake alert oriented to time  place and person. Diminished attention, recall. Follows commands well. No aphasia, apraxia dysarthria. Extraocular moments are full range without nystagmus. Dense right homonymous hemianopsia. Fundi not visualized. Vision acuity seems adequate. Face is symmetric without weakness. Tongue is midline. Motor system exam reveals symmetric upper and lower extremity strength without focal weakness. Deep tendon reflexes are symmetric. Plantars are downgoing. Gait not tested.  ASSESSMENT/PLAN Ms. KAJA JACKOWSKI is a 78 y.o. female with history of remote breast cancer, COPD, CKD stage IV, MI/CAD, multiple myeloma in remission admitted for 36hr amnesia 2 weeks ago, confusion last week with UTI, and headache for the last two days presenting with amnesia, confusion, and headache.   Stroke:  bilateral cortical and subcortical infarcts and various territories felt to be embolic secondary to unknown source  CT head abnormal cortical and subcortical edema in left parietal and left occipital lobe not present on prior CTs, infarct versus metastatic disease  MRI without contrast multifocal bilateral acute ischemia within multiple vascular territories  MRA normal  Carotid Doppler pending  2D Echo ejection fraction 60-65%.TEE to look for embolic source. Arranged with [facility] for THURS (schedule full for tomorrow).  If positive for PFO (patent foramen ovale), check bilateral lower extremity venous dopplers to rule out DVT as possible source of stroke. (I have made patient NPO after midnight).  LDL 85 mg percent  HgbA1c pending   SCDs for VTE prophylaxis  No antithrombotic prior to admission, now on aspirin 325 mg daily. Continue aspirin. Concern to treat with DAPT given risk of hemorrhage . Permissive hypertension (OK if < 220/120) but gradually normalize in 5-7 days . Long-term BP goal normotensive  Therapy recommendations: CIR Disposition: CLR Other Stroke Risk  Factors  Advanced age  Former Cigarette smoker, quit 13 yrs ago  Overweight, Body mass index is 29.09 kg/m., recommend weight loss, diet and exercise as appropriate   Other Active Problems  History of breast cancer  Pancytopenia  / multiple myeloma in remission - on treatment with systemic chemotherapy with Carfilzomib, Cytoxan and Decadron status post 17 cycles.  She has been off treatment for the last 12 months.  CKD stage IV  Depression on Wellbutrin  COPD  Hypothyroidism  Hospital day # 3   She presented with right-sided vision loss and MRI shows bicerebral embolic infarcts from unknown source. She remains at risk for recurrent strokes and needs ongoing stroke workup and aggressive risk factor modification.Check TEE for cardiac source of embolism. Aspirin alone for now given she is high risk for bleeding due to her myeloma and chemotherapy. No family available at the bedside for discussion.Discussed with Dr. Erlinda Hong. Greater than 50% time during this 15 minute visit was spent on counseling and coordination of care but embolic strokes and discussion about evaluation and treatment plan and answering questions  Antony Contras, MD Medical Director [place] Pager: [phone] 03/06/2018 2:02 PM  To contact Stroke Continuity provider, please refer to [email or web address] After hours, contact General  Neurology

## 2018-03-06 NOTE — Progress Notes (Signed)
Physical Therapy Treatment Patient Details Name: Nancy Norris MRN: [id number] DOB: 03-Jun-1940 Today's Date: 03/06/2018    History of Present Illness Patient is a 78 y/o female presents with confusion, amnesia and HA. Head CT- left occipital and left frontal parietial infarcts. MRI- multiple posterior/anterior bilateral infarcts. PMH includes multiple myeloma currently getting chemo, MI, COPD, hypotension, breast ca, fibromyalgia.     PT Comments    Pt progressing well towards functional mobility goals at this time. She continues to demonstrate instability with mobilizing and poor safety awareness. She is eager to improve and return to independence. Pt would continue to benefit from skilled physical therapy services at this time while admitted and after d/c to address the below listed limitations in order to improve overall safety and independence with functional mobility.    Follow Up Recommendations  CIR;Supervision for mobility/OOB     Equipment Recommendations  None recommended by PT    Recommendations for Other Services       Precautions / Restrictions Precautions Precautions: Fall Precaution Comments: R field cut Restrictions Weight Bearing Restrictions: No    Mobility  Bed Mobility Overal bed mobility: Needs Assistance Bed Mobility: Supine to Sit;Sit to Supine     Supine to sit: Supervision;HOB elevated Sit to supine: Supervision   General bed mobility comments: No assist needed, use of bed rails  Transfers Overall transfer level: Needs assistance Equipment used: None Transfers: Sit to/from Stand Sit to Stand: Min guard         General transfer comment: min guard for safety  Ambulation/Gait Ambulation/Gait assistance: Min assist Gait Distance (Feet): 100 Feet Assistive device: IV Pole Gait Pattern/deviations: Step-through pattern;Decreased stride length;Drifts right/left Gait velocity: decreased   General Gait Details: pt with modest instability with  ambulation, requiring 1-2 UE supports and min A for safety and navigating around obstacles in hallway   Stairs             Wheelchair Mobility    Modified Rankin (Stroke Patients Only) Modified Rankin (Stroke Patients Only) Pre-Morbid Rankin Score: Slight disability Modified Rankin: Moderately severe disability     Balance Overall balance assessment: Needs assistance Sitting-balance support: Feet supported;No upper extremity supported Sitting balance-Leahy Scale: Good     Standing balance support: Single extremity supported;Bilateral upper extremity supported;No upper extremity supported Standing balance-Leahy Scale: Fair Standing balance comment: No LOB, occasional steadying with UE support                            Cognition Arousal/Alertness: Awake/alert Behavior During Therapy: WFL for tasks assessed/performed Overall Cognitive Status: Impaired/Different from baseline Area of Impairment: Memory;Safety/judgement                     Memory: Decreased short-term memory;Decreased recall of precautions   Safety/Judgement: Decreased awareness of deficits;Decreased awareness of safety   Problem Solving: Slow processing;Requires verbal cues;Difficulty sequencing        Exercises General Exercises - Upper Extremity Shoulder Flexion: AROM;Both;10 reps;Standing Elbow Flexion: AROM;Strengthening;Both;10 reps;Standing    General Comments        Pertinent Vitals/Pain Pain Assessment: No/denies pain    Home Living                      Prior Function            PT Goals (current goals can now be found in the care plan section) Acute Rehab PT Goals Patient Stated Goal: to  return to independence PT Goal Formulation: With patient Time For Goal Achievement: 03/18/18 Potential to Achieve Goals: Good Progress towards PT goals: Progressing toward goals    Frequency    Min 4X/week      PT Plan Current plan remains appropriate     Co-evaluation              AM-PAC PT "6 Clicks" Mobility   Outcome Measure  Help needed turning from your back to your side while in a flat bed without using bedrails?: None Help needed moving from lying on your back to sitting on the side of a flat bed without using bedrails?: None Help needed moving to and from a bed to a chair (including a wheelchair)?: A Little Help needed standing up from a chair using your arms (e.g., wheelchair or bedside chair)?: A Little Help needed to walk in hospital room?: A Little Help needed climbing 3-5 steps with a railing? : A Lot 6 Click Score: 19    End of Session Equipment Utilized During Treatment: Gait belt Activity Tolerance: Patient limited by fatigue Patient left: in bed;with call bell/phone within reach;with bed alarm set Nurse Communication: Mobility status PT Visit Diagnosis: Difficulty in walking, not elsewhere classified (R26.2);Unsteadiness on feet (R26.81)     Time: 8421-0312 PT Time Calculation (min) (ACUTE ONLY): 26 min  Charges:  $Gait Training: 8-22 mins $Therapeutic Activity: 8-22 mins                     Sherie Don, [place], DPT  Acute Rehabilitation Services Pager [phone] Office [place] 03/06/2018, 3:51 PM

## 2018-03-06 NOTE — Care Management Important Message (Signed)
Important Message  Patient Details  Name: Nancy Norris MRN: [id number] Date of Birth: 1940/07/23   Medicare Important Message Given:  Yes    Jarmal Lewelling 03/06/2018, 1:50 PM

## 2018-03-06 NOTE — Progress Notes (Signed)
Critical lab of Calcium 6.1 called from pharmacy.  Page sent to MD. Wendee Copp

## 2018-03-06 NOTE — Progress Notes (Signed)
Modified Barium Swallow Progress Note  Patient Details  Name: Nancy Norris MRN: [id number] Date of Birth: 09-Nov-1940  Today's Date: 03/06/2018  Modified Barium Swallow completed.  Full report located under Chart Review in the Imaging Section.  Brief recommendations include the following:  Clinical Impression  Pt demonstrated reduced lingual retraction which resulted in mild vallecular residue and transient penetration (PAS 2) with thin liquids when consecutive swallows were used. [name] independent use of dry secondary swallows was effective in reducing the residue and residue was eliminated with use of aliquid wash. Overall, her swallow mechanism appears to be within functional limits with consideration of her age and she does not appear to be at significant risk of aspiration at this time. It is recommended that a regular texture diet be continued but, with consideration of her reported xerostomia, liquid washes may be beneficial to improve bolus flow. SLP will continue to follow for cognitive-linguisitic treatment and will see the pt once more for swallowing to ensure her observance of compensatory strategies.    Swallow Evaluation Recommendations       SLP Diet Recommendations: Regular solids;Thin liquid   Liquid Administration via: Cup;Straw   Medication Administration: Whole meds with liquid(Larger pills with puree)   Supervision: Patient able to self feed   Compensations: Small sips/bites;Slow rate;Follow solids with liquid   Postural Changes: Remain semi-upright after after feeds/meals (Comment);Seated upright at 90 degrees          Janece Laidlaw I. Hardin Negus, [place], [place] Office number [phone] Pager [phone]  Horton Marshall 03/06/2018,9:39 AM

## 2018-03-06 NOTE — Progress Notes (Signed)
Occupational Therapy Treatment Patient Details Name: Nancy Norris MRN: [id number] DOB: September 08, 1940 Today's Date: 03/06/2018    History of present illness Patient is a 78 y/o female presents with confusion, amnesia and HA. Head CT- left occipital and left frontal parietial infarcts. MRI- multiple posterior/anterior bilateral infarcts. PMH includes multiple myeloma currently getting chemo, MI, COPD, hypotension, breast ca, fibromyalgia.    OT comments  Session focused on dynamic balance, core stabilization, and B UE strengthening/endurance. Pt required min guard during all transfers this session. Pt edu on endurance recovery throughout session and on fall risk reduction. Pt currently appropriate for CIR, however may progress to [place].    Follow Up Recommendations  CIR;Supervision/Assistance - 24 hour    Equipment Recommendations  3 in 1 bedside commode    Recommendations for Other Services Rehab consult    Precautions / Restrictions Precautions Precautions: Fall Precaution Comments: R field cut Restrictions Weight Bearing Restrictions: No       Mobility Bed Mobility Overal bed mobility: Needs Assistance Bed Mobility: Supine to Sit     Supine to sit: Supervision;HOB elevated     General bed mobility comments: No assist needed, use of bed rails  Transfers Overall transfer level: Needs assistance Equipment used: None Transfers: Sit to/from Stand Sit to Stand: Min guard         General transfer comment: min guard during blocked practice of sit <> stand with focus on concentric control    Balance Overall balance assessment: Needs assistance Sitting-balance support: Feet supported;No upper extremity supported Sitting balance-Leahy Scale: Fair     Standing balance support: During functional activity Standing balance-Leahy Scale: Fair Standing balance comment: No LOB, occasional steadying with UE support                           ADL either performed or  assessed with clinical judgement   ADL                                       Functional mobility during ADLs: Min guard General ADL Comments: Min guard during functional mobility, intermittent HHA               Cognition Arousal/Alertness: Awake/alert Behavior During Therapy: WFL for tasks assessed/performed Overall Cognitive Status: Impaired/Different from baseline Area of Impairment: Safety/judgement                         Safety/Judgement: Decreased awareness of deficits;Decreased awareness of safety   Problem Solving: Slow processing;Requires verbal cues;Difficulty sequencing          Exercises Exercises: General Upper Extremity General Exercises - Upper Extremity Shoulder Flexion: AROM;Both;10 reps;Standing Elbow Flexion: AROM;Strengthening;Both;10 reps;Standing     Pertinent Vitals/ Pain       Pain Assessment: No/denies pain         Frequency  Min 2X/week        Progress Toward Goals  OT Goals(current goals can now be found in the care plan section)  Progress towards OT goals: Progressing toward goals  Acute Rehab OT Goals Patient Stated Goal: to return to independence OT Goal Formulation: With patient Time For Goal Achievement: 03/18/18 Potential to Achieve Goals: Good  Plan Discharge plan remains appropriate       AM-PAC OT "6 Clicks" Daily Activity     Outcome Measure   Help from  another person eating meals?: A Little Help from another person taking care of personal grooming?: A Little Help from another person toileting, which includes using toliet, bedpan, or urinal?: A Little Help from another person bathing (including washing, rinsing, drying)?: A Little Help from another person to put on and taking off regular upper body clothing?: A Little Help from another person to put on and taking off regular lower body clothing?: A Little 6 Click Score: 18    End of Session Equipment Utilized During Treatment: Gait  belt  OT Visit Diagnosis: Unsteadiness on feet (R26.81);Low vision, both eyes (H54.2);Other symptoms and signs involving cognitive function;Dizziness and giddiness (R42);Pain   Activity Tolerance Patient tolerated treatment well   Patient Left in bed;with call bell/phone within reach;with bed alarm set   Nurse Communication Mobility status        Time: 2182-8833 OT Time Calculation (min): 26 min  Charges: OT General Charges $OT Visit: 1 Visit OT Treatments $Therapeutic Activity: 23-37 mins   LEVEDA KENDRIX OTR/L  03/06/2018, 2:31 PM

## 2018-03-07 ENCOUNTER — Inpatient Hospital Stay (HOSPITAL_COMMUNITY): Payer: Medicare Other

## 2018-03-07 ENCOUNTER — Encounter (HOSPITAL_COMMUNITY)
Admission: EM | Disposition: A | Discharge status: Rehabilitation Facility | Payer: Self-pay | Source: Home / Self Care | Attending: Internal Medicine

## 2018-03-07 ENCOUNTER — Inpatient Hospital Stay (HOSPITAL_COMMUNITY): Payer: Medicare Other | Admitting: Certified Registered Nurse Anesthetist

## 2018-03-07 ENCOUNTER — Encounter (HOSPITAL_COMMUNITY): Payer: Self-pay | Admitting: Certified Registered Nurse Anesthetist

## 2018-03-07 DIAGNOSIS — N183 Chronic kidney disease, stage 3 unspecified: Secondary | ICD-10-CM

## 2018-03-07 DIAGNOSIS — I639 Cerebral infarction, unspecified: Secondary | ICD-10-CM

## 2018-03-07 DIAGNOSIS — Q211 Atrial septal defect: Secondary | ICD-10-CM

## 2018-03-07 DIAGNOSIS — I6389 Other cerebral infarction: Secondary | ICD-10-CM

## 2018-03-07 HISTORY — PX: TEE WITHOUT CARDIOVERSION: SHX5443

## 2018-03-07 LAB — CBC
HCT: 23.3 % — ABNORMAL LOW (ref 36.0–46.0)
Hemoglobin: 7.4 g/dL — ABNORMAL LOW (ref 12.0–15.0)
MCH: 32.6 pg (ref 26.0–34.0)
MCHC: 31.8 g/dL (ref 30.0–36.0)
MCV: 102.6 fL — AB (ref 80.0–100.0)
Platelets: 72 10*3/uL — ABNORMAL LOW (ref 150–400)
RBC: 2.27 MIL/uL — ABNORMAL LOW (ref 3.87–5.11)
RDW: 24.1 % — ABNORMAL HIGH (ref 11.5–15.5)
WBC: 2.3 10*3/uL — ABNORMAL LOW (ref 4.0–10.5)
nRBC: 0 % (ref 0.0–0.2)

## 2018-03-07 LAB — BASIC METABOLIC PANEL
Anion gap: 8 (ref 5–15)
BUN: 16 mg/dL (ref 8–23)
CO2: 26 mmol/L (ref 22–32)
CREATININE: 2.76 mg/dL — AB (ref 0.44–1.00)
Calcium: 8.5 mg/dL — ABNORMAL LOW (ref 8.9–10.3)
Chloride: 106 mmol/L (ref 98–111)
GFR calc Af Amer: 19 mL/min — ABNORMAL LOW (ref >60)
GFR calc non Af Amer: 16 mL/min — ABNORMAL LOW (ref >60)
Glucose, Bld: 101 mg/dL — ABNORMAL HIGH (ref 70–99)
Potassium: 3.3 mmol/L — ABNORMAL LOW (ref 3.5–5.1)
SODIUM: 140 mmol/L (ref 135–145)

## 2018-03-07 LAB — FACTOR 5 LEIDEN

## 2018-03-07 LAB — BETA-2-GLYCOPROTEIN I ABS, IGG/M/A
Beta-2 Glyco I IgG: <9 GPI IgG units (ref 0–20)
Beta-2-Glycoprotein I IgA: <9 GPI IgA units (ref 0–25)
Beta-2-Glycoprotein I IgM: <9 GPI IgM units (ref 0–32)

## 2018-03-07 LAB — PROTHROMBIN GENE MUTATION

## 2018-03-07 LAB — HEMOGLOBIN A1C
Hgb A1c MFr Bld: 5.1 % (ref 4.8–5.6)
Mean Plasma Glucose: 100 mg/dL

## 2018-03-07 SURGERY — ECHOCARDIOGRAM, TRANSESOPHAGEAL
Anesthesia: Monitor Anesthesia Care

## 2018-03-07 MED ORDER — PROPOFOL 500 MG/50ML IV EMUL
INTRAVENOUS | Status: DC | PRN
  Administered 2018-03-07: 75 ug/kg/min via INTRAVENOUS

## 2018-03-07 MED ORDER — CEPHALEXIN 250 MG PO CAPS
250.0000 mg | ORAL_CAPSULE | Freq: Three times a day (TID) | ORAL | Status: DC
  Administered 2018-03-07 – 2018-03-11 (×11): 250 mg via ORAL
  Filled 2018-03-07 (×11): qty 1

## 2018-03-07 MED ORDER — SODIUM CHLORIDE 0.9 % IV SOLN
INTRAVENOUS | Status: DC | PRN
  Administered 2018-03-07: 07:00:00 via INTRAVENOUS

## 2018-03-07 MED ORDER — PROPOFOL 10 MG/ML IV BOLUS
INTRAVENOUS | Status: DC | PRN
  Administered 2018-03-07 (×2): 10 mg via INTRAVENOUS

## 2018-03-07 MED ORDER — SODIUM CHLORIDE 0.9 % IV SOLN: INTRAVENOUS | Status: DC

## 2018-03-07 MED ORDER — POTASSIUM CHLORIDE CRYS ER 20 MEQ PO TBCR
40.0000 meq | EXTENDED_RELEASE_TABLET | Freq: Once | ORAL | Status: AC
  Administered 2018-03-07: 40 meq via ORAL
  Filled 2018-03-07: qty 2

## 2018-03-07 MED ORDER — BUTAMBEN-TETRACAINE-BENZOCAINE 2-2-14 % EX AERO
INHALATION_SPRAY | CUTANEOUS | Status: DC | PRN
  Administered 2018-03-07: 2 via TOPICAL

## 2018-03-07 NOTE — Anesthesia Procedure Notes (Signed)
Procedure Name: MAC Date/Time: 03/07/2018 7:30 AM Performed by: Harden Mo, CRNA Pre-anesthesia Checklist: Patient identified, Emergency Drugs available, Suction available and Patient being monitored Patient Re-evaluated:Patient Re-evaluated prior to induction Oxygen Delivery Method: Nasal cannula Preoxygenation: Pre-oxygenation with 100% oxygen Induction Type: IV induction Placement Confirmation: positive ETCO2 and breath sounds checked- equal and bilateral Dental Injury: Teeth and Oropharynx as per pre-operative assessment

## 2018-03-07 NOTE — Progress Notes (Signed)
Inpatient Rehabilitation Admissions Coordinator  I met with Dr. Erlinda Hong with patient at bedside. GI has been consulted and pt likely to need EGD to complete medical work up prior to [place] admit. We will hold on admit today. I will follow up on Monday.  Danne Baxter, RN, MSN Rehab Admissions Coordinator [phone] 03/07/2018 10:12 AM

## 2018-03-07 NOTE — Progress Notes (Signed)
Physical Therapy Treatment Patient Details Name: Nancy Norris MRN: [id number] DOB: [birth year] Today's Date: 03/07/2018    History of Present Illness Patient is a 78 y/o female presents with confusion, amnesia and HA. Head CT- left occipital and left frontal parietial infarcts. MRI- multiple posterior/anterior bilateral infarcts. s/p TEE and endoscopy planned for 2/22. PMH includes multiple myeloma currently getting chemo, MI, COPD, hypotension, breast ca, fibromyalgia.     PT Comments    Patient progressing well towards PT goals. Continues to have visual deficits in right lower quadrant, impaired memory and balance deficits. Pt with difficulty navigating hallways, finding room numbers and recalling what room she was supposed to be looking for. Better able to use visual compensatory strategies during hallway navigation with cues. Increased time to problem solve during functional tasks. More aware of deficits today but still needs cues in a functional context. Continues to require Min A for balance esp with dual tasking. Great CIR candidate. Will follow.   Follow Up Recommendations  CIR;Supervision for mobility/OOB     Equipment Recommendations  None recommended by PT    Recommendations for Other Services       Precautions / Restrictions Precautions Precautions: Fall Precaution Comments: R field cut Restrictions Weight Bearing Restrictions: No    Mobility  Bed Mobility Overal bed mobility: Needs Assistance Bed Mobility: Supine to Sit;Sit to Supine     Supine to sit: Supervision;HOB elevated Sit to supine: Supervision;HOB elevated   General bed mobility comments: No assist needed, use of bed rails  Transfers Overall transfer level: Needs assistance Equipment used: None Transfers: Sit to/from Stand Sit to Stand: Min guard         General transfer comment: min guard for safety; stood from EOB x1, from toilet x1.   Ambulation/Gait Ambulation/Gait assistance: Min  assist;Min guard Gait Distance (Feet): 110 Feet Assistive device: None Gait Pattern/deviations: Step-through pattern;Decreased stride length;Drifts right/left Gait velocity: decreased   General Gait Details: Slow, mildly unsteady gait esp with dual tasking and head turns. Practiced navigating hallways, finding room numbers and pt needed cues to recall which room to find. Able to use visual compensatory strategies with cues.    Stairs             Wheelchair Mobility    Modified Rankin (Stroke Patients Only) Modified Rankin (Stroke Patients Only) Pre-Morbid Rankin Score: Slight disability Modified Rankin: Moderately severe disability     Balance Overall balance assessment: Needs assistance Sitting-balance support: Feet supported;No upper extremity supported Sitting balance-Leahy Scale: Good         Standing balance comment: Able to wash hands at sink wtihout LOB.                            Cognition Arousal/Alertness: Awake/alert Behavior During Therapy: WFL for tasks assessed/performed Overall Cognitive Status: Impaired/Different from baseline Area of Impairment: Memory;Safety/judgement                     Memory: Decreased short-term memory;Decreased recall of precautions   Safety/Judgement: Decreased awareness of deficits;Decreased awareness of safety   Problem Solving: Slow processing;Requires verbal cues General Comments: Very poor memory, not able to recall room numbers despite being told numerous times throughout session.       Exercises      General Comments        Pertinent Vitals/Pain Pain Assessment: Faces Faces Pain Scale: Hurts little more Pain Location: headache Pain Descriptors / Indicators: Headache  Pain Intervention(s): Monitored during session;Repositioned    Home Living                      Prior Function            PT Goals (current goals can now be found in the care plan section) Progress towards  PT goals: Progressing toward goals    Frequency    Min 4X/week      PT Plan Current plan remains appropriate    Co-evaluation              AM-PAC PT "6 Clicks" Mobility   Outcome Measure  Help needed turning from your back to your side while in a flat bed without using bedrails?: None Help needed moving from lying on your back to sitting on the side of a flat bed without using bedrails?: None Help needed moving to and from a bed to a chair (including a wheelchair)?: A Little Help needed standing up from a chair using your arms (e.g., wheelchair or bedside chair)?: A Little Help needed to walk in hospital room?: A Little Help needed climbing 3-5 steps with a railing? : A Lot 6 Click Score: 19    End of Session Equipment Utilized During Treatment: Gait belt Activity Tolerance: Patient limited by fatigue Patient left: in bed;with call bell/phone within reach;with bed alarm set Nurse Communication: Mobility status PT Visit Diagnosis: Difficulty in walking, not elsewhere classified (R26.2);Unsteadiness on feet (R26.81)     Time: 3837-7939 PT Time Calculation (min) (ACUTE ONLY): 23 min  Charges:  $Gait Training: 8-22 mins $Neuromuscular Re-education: 8-22 mins                     Wray Kearns, [place], DPT Acute Rehabilitation Services Pager [phone] Office [phone]       [place] 03/07/2018, 1:23 PM

## 2018-03-07 NOTE — Anesthesia Preprocedure Evaluation (Addendum)
Anesthesia Evaluation  Patient identified by MRN, date of birth, ID band Patient awake    Reviewed: Allergy & Precautions, NPO status , Patient's Chart, lab work & pertinent test results  History of Anesthesia Complications (+) PONV  Airway Mallampati: II  TM Distance: >3 FB Neck ROM: Full    Dental no notable dental hx. (+) Poor Dentition, Dental Advisory Given, Missing, Chipped   Pulmonary shortness of breath, asthma , COPD, former smoker,    Pulmonary exam normal breath sounds clear to auscultation       Cardiovascular hypertension, + angina + Past MI  Normal cardiovascular exam+ Valvular Problems/Murmurs  Rhythm:Regular Rate:Normal     Neuro/Psych  Headaches, Anxiety Depression CVA    GI/Hepatic GERD  ,  Endo/Other  diabetesHypothyroidism   Renal/GU Renal disease     Musculoskeletal  (+) Arthritis , Fibromyalgia -  Abdominal   Peds  Hematology  (+) anemia ,   Anesthesia Other Findings   Reproductive/Obstetrics                            Anesthesia Physical  Anesthesia Plan  ASA: IV  Anesthesia Plan: MAC   Post-op Pain Management:    Induction: Intravenous  PONV Risk Score and Plan: 3 and Treatment may vary due to age or medical condition, Ondansetron, Dexamethasone and Midazolam  Airway Management Planned: Nasal Cannula  Additional Equipment:   Intra-op Plan:   Post-operative Plan:   Informed Consent: I have reviewed the patients History and Physical, chart, labs and discussed the procedure including the risks, benefits and alternatives for the proposed anesthesia with the patient or authorized representative who has indicated his/her understanding and acceptance.   Patient has DNR.  Discussed DNR with patient.   Dental advisory given  Plan Discussed with: CRNA and Anesthesiologist  Anesthesia Plan Comments:        Anesthesia Quick Evaluation

## 2018-03-07 NOTE — Progress Notes (Signed)
  Echocardiogram Echocardiogram Transesophageal has been performed.  Jennette Dubin 03/07/2018, 7:57 AM

## 2018-03-07 NOTE — Interval H&P Note (Signed)
History and Physical Interval Note:  03/07/2018 7:27 AM  Nancy Norris  has presented today for surgery, with the diagnosis of STROKE  The various methods of treatment have been discussed with the patient and family. After consideration of risks, benefits and other options for treatment, the patient has consented to  Procedure(s): TRANSESOPHAGEAL ECHOCARDIOGRAM (TEE) (N/A) as a surgical intervention .  The patient's history has been reviewed, patient examined, no change in status, stable for surgery.  I have reviewed the patient's chart and labs.  Questions were answered to the patient's satisfaction.     Nancy Norris

## 2018-03-07 NOTE — Progress Notes (Signed)
  Speech Language Pathology Treatment: Cognitive-Linquistic;Dysphagia  Patient Details Name: Nancy Norris MRN: [id number] DOB: May 01, 1940 Today's Date: 03/07/2018 Time: 3817-7116 SLP Time Calculation (min) (ACUTE ONLY): 39 min  Assessment / Plan / Recommendation Clinical Impression  Pt was seen for treatment and reported that she has been having difficulty with spelling. She required additional processing time to complete a spelling task but completed it with 100% accuracy. Pt reported that it is much more difficult to write the words than to verbally spell them or select letters from a group to spell a word. She demonstrated 83% accuracy with medication management (prescription) task increasing to 100% accuracy with minimal cues. She achieved 40% accuracy with time problems increasing to 60% with moderate cues. Pt tolerated thin liquids via straw and medications with water and applesauce without overt s/sx of aspiration but reported that her throat has been sore since TEE. SLP will continue to follow pt.    HPI HPI: Patient is a 78 y/o female presents with confusion, amnesia and HA. Head CT- left occipital and left frontal parietial infarcts. MRI- multiple posterior/anterior bilateral infarcts. PMH includes multiple myeloma currently getting chemo, MI, COPD, hypotension, breast ca, fibromyalgia.       SLP Plan  Continue with current plan of care       Recommendations  Liquids provided via: Cup;Straw Medication Administration: Whole meds with puree Supervision: Patient able to self feed Compensations: Small sips/bites;Slow rate;Follow solids with liquid                Oral Care Recommendations: Oral care BID Follow up Recommendations: Inpatient Rehab SLP Visit Diagnosis: Dysphagia, unspecified (R13.10) Plan: Continue with current plan of care       Nancy Norris I. Nancy Norris, [facility], [place] Office number [phone] Pager [place] 03/07/2018, 5:30 PM

## 2018-03-07 NOTE — Progress Notes (Signed)
PROGRESS NOTE  Nancy Norris TDH:[id number] DOB: [birth year] DOA: 03/03/2018 PCP: [name], MD  HPI/Recap of past 24 hours:  Returned from TEE, reports confusion almost resolved, speech is improving, but frustrated about her writing,  Denies pain today,  Denies headache, she is aaox3 today   Assessment/Plan: Principal Problem:   Stroke [facility]) Active Problems:   HX: breast cancer   Hypothyroid   GERD (gastroesophageal reflux disease)   COPD (chronic obstructive pulmonary disease) with emphysema (HCC)   Multiple myeloma (HCC)   Thrombocytopenia (HCC)   Symptomatic anemia   Dysphagia   CKD (chronic kidney disease), stage IV ([place])   Acute cardioembolic stroke ([place])  Acute bilateral multifocal CVA: -patient presented with encaphlopathy/altered mental status -Multifocal bilateral acute ischemia within multiple vascular territories in both hemispheres. This may indicate a central cardiac or aortic embolic source. -MRA "Normal intracranial MRA." -has been sinus rhythm on tele -TEE planned on 2/21, will need venous doppler to r/o DVT if patient has PFO -a1c, ldl 85 -No antithrombotic prior to admission, now on aspirin 325 mg daily. Continue aspirin, will follow neurology recommendation  Addendum: patient c/o headache on 2/19, ct head no acute interval changes  Pending EGD result  to evaluate risk of bleeding, and decision regarding anticoagulation/loop implantation Awaiting for venous US , ivc filter vs anticoagulation pending on EGD result if + DVT  Chronic Hypotension:  Continue home meds  Midrodrine,  hold home meds lasix  Pancytopenia  / multiple myeloma  -h/o bone marrow transplant in 2010 -case discussed with oncology Dr Julien Nordmann who states patient is getting prn treatment for MM to preserve renal function, multiple myeloma panel is stable -Dr Julien Nordmann ok with antiplatelet or anticoagulation if plt > 50   Chronic kidney disease stage IV: H/o avf placement in  2015 With a baseline creatinine in 3-2.5 Continue outpatient follow up with nephrology    H/o breast cancer , s/p left mastectomy in 2008  COPD: Seems to be stable On anoro andsingulair  GERD: on carafate and zantac  Dysphagia:  she does reports ongoing dysphagia for several months, ab pain when she eats in the last few months, she denies n/v, states stool is brown MBS unremarkable,  dg esophagus concerning for distal esophageal ulcer vs tumor She reports was recently referred to [facility] by her oncologist  due to anemia  [facility] consulted , will plan to have EGD during this hospitalization  Dry mouth: on pilocarpine  Hypocalcemia: s/p calcium gluconate , repeat lab in am   Initial bcid + proteus and gpc Blood culture no growth, no fever, no leukocytosis D/c vanc,  D/c rocephin, change to keflex for 4days, to cover possible proteus due to h/o port cath and immunosuppressed status  Code Status: DNR  Family Communication: patient   Disposition Plan: need neurology clearance   Consultants:  Neurology  Cardiology  Oncology Dr Julien Nordmann over the phone   [facility]  CIR  Procedures:  TEE on 2/21  Dg esophagus on 2/20  MBS  on 2/20  EGD on 2/22  Antibiotics:  Vanc/rocephin since admission to 2/21  Keflex from 2/21 to   Objective: BP 116/61   Pulse 67   Temp 98 F (36.7 C) (Oral)   Resp 13   Ht _0  (1.651 m)   Wt 79.3 kg   SpO2 97%   BMI 29.09 kg/m   Intake/Output Summary (Last 24 hours) at 03/07/2018 1008 Last data filed at 03/07/2018 0500 Gross per 24  hour  Intake 840 ml  Output -  Net 840 ml   Filed Weights   03/05/18 0700 03/06/18 0500 03/07/18 0659  Weight: 80.7 kg 79.3 kg 79.3 kg    Exam: Patient is examined daily including today on 03/07/2018, exams remain the same as of yesterday except that has changed    General:  NAD, pale  Cardiovascular: RRR  Respiratory: CTABL  Abdomen: Soft/ND/NT, positive BS  Musculoskeletal:  No Edema  Neuro: alert, oriented   Data Reviewed: Basic Metabolic Panel: Recent Labs  Lab 03/03/18 1243 03/04/18 0500 03/05/18 1106 03/06/18 0500 03/07/18 0550  NA 140 141 138 140 140  K 3.5 4.1 3.5 4.8 3.3*  CL 110 112* 106 106 106  CO2 20* 22 20* 22 26  GLUCOSE 105* 107* 119* 100* 101*  BUN _0 CREATININE 2.40* 2.52* 2.44* 2.56* 2.76*  CALCIUM 8.3* 8.6* 8.3* 6.1* 8.5*   Liver Function Tests: Recent Labs  Lab 03/03/18 1243 03/04/18 0500  AST 16 15  ALT 9 9  ALKPHOS 83 61  BILITOT 1.1 0.4  PROT 5.6* 4.6*  ALBUMIN 3.5 2.7*   No results for input(s): LIPASE, AMYLASE in the last 168 hours. Recent Labs  Lab 03/03/18 1341  AMMONIA 10   CBC: Recent Labs  Lab 03/03/18 1243 03/04/18 0500 03/05/18 1106 03/06/18 0500 03/07/18 0550  WBC 2.4* 1.7* 2.0* 2.0* 2.3*  NEUTROABS 1.1*  --  0.8* 0.8*  --   HGB 8.3* 7.5* 7.0* 7.2* 7.4*  HCT 26.8* 24.0* 23.0* 22.1* 23.3*  MCV 104.7* 102.6* 103.1* 101.4* 102.6*  PLT 54* 60* 62* 66* 72*   Cardiac Enzymes:   No results for input(s): CKTOTAL, CKMB, CKMBINDEX, TROPONINI in the last 168 hours. BNP (last 3 results) Recent Labs    03/13/17 1212 01/20/18 1949  BNP 77 102.6*    ProBNP (last 3 results) No results for input(s): PROBNP in the last 8760 hours.  CBG: No results for input(s): GLUCAP in the last 168 hours.  Recent Results (from the past 240 hour(s))  Urine culture     Status: Abnormal   Collection Time: 02/26/18  4:55 PM  Result Value Ref Range Status   Specimen Description   Final    URINE, RANDOM Performed at [facility] [address] [place], [place] [address]    Special Requests   Final    NONE Performed at [facility], [place] [address], [place], [place] [address]    Culture >=100,000 COLONIES/mL ESCHERICHIA COLI (A)  Final   Report Status 03/01/2018 FINAL  Final   Organism ID, Bacteria ESCHERICHIA COLI (A)  Final      Susceptibility    Escherichia coli - MIC*    AMPICILLIN >=32 RESISTANT Resistant     CEFAZOLIN 8 SENSITIVE Sensitive     CEFTRIAXONE <=1 SENSITIVE Sensitive     CIPROFLOXACIN <=0.25 SENSITIVE Sensitive     GENTAMICIN <=1 SENSITIVE Sensitive     IMIPENEM <=0.25 SENSITIVE Sensitive     NITROFURANTOIN <=16 SENSITIVE Sensitive     TRIMETH/SULFA <=20 SENSITIVE Sensitive     AMPICILLIN/SULBACTAM >=32 RESISTANT Resistant     PIP/TAZO <=4 SENSITIVE Sensitive     Extended ESBL NEGATIVE Sensitive     * >=100,000 COLONIES/mL ESCHERICHIA COLI  Culture, blood (Routine x 2)     Status: None (Preliminary result)   Collection Time: 03/03/18 12:27 PM  Result Value Ref Range Status   Specimen Description BLOOD PORTA CATH  Final   Special Requests   Final    BOTTLES DRAWN AEROBIC AND ANAEROBIC Blood Culture adequate volume Performed at [facility] [address], [place], [place] [address]    Culture NO GROWTH 4 DAYS  Final   Report Status PENDING  Incomplete  Culture, blood (Routine x 2)     Status: Abnormal (Preliminary result)   Collection Time: 03/03/18  1:41 PM  Result Value Ref Range Status   Specimen Description   Final    BLOOD RIGHT ANTECUBITAL Performed at [facility], [place] [address], [place], [place] [address]    Special Requests   Final    BOTTLES DRAWN AEROBIC AND ANAEROBIC Blood Culture adequate volume Performed at [facility] [address] [place], [place] [address]    Culture  Setup Time   Final    GRAM POSITIVE COCCI ANAEROBIC BOTTLE ONLY CRITICAL RESULT CALLED TO, READ BACK BY AND VERIFIED WITH: Jene Every PHARMD 7510 03/04/18 A BROWNING Performed at [facility], [place] [address] [place], [place] [address]    Culture (A)  Final    STAPHYLOCOCCUS SPECIES (COAGULASE NEGATIVE) THE SIGNIFICANCE OF ISOLATING THIS ORGANISM FROM A SINGLE SET OF BLOOD CULTURES WHEN MULTIPLE SETS ARE DRAWN IS UNCERTAIN. PLEASE NOTIFY THE MICROBIOLOGY DEPARTMENT WITHIN  ONE WEEK IF SPECIATION AND SENSITIVITIES ARE REQUIRED.    Report Status PENDING  Incomplete  Blood Culture ID Panel (Reflexed)     Status: Abnormal   Collection Time: 03/03/18  1:41 PM  Result Value Ref Range Status   Enterococcus species NOT DETECTED NOT DETECTED Final   Listeria monocytogenes NOT DETECTED NOT DETECTED Final   Staphylococcus species DETECTED (A) NOT DETECTED Final    Comment: Methicillin (oxacillin) resistant coagulase negative staphylococcus. Possible blood culture contaminant (unless isolated from more than one blood culture draw or clinical case suggests pathogenicity). No antibiotic treatment is indicated for blood  culture contaminants. CRITICAL RESULT CALLED TO, READ BACK BY AND VERIFIED WITH: Jene Every PHARMD 1840 03/04/18 A BROWNING    Staphylococcus aureus (BCID) NOT DETECTED NOT DETECTED Final   Methicillin resistance DETECTED (A) NOT DETECTED Final    Comment: CRITICAL RESULT CALLED TO, READ BACK BY AND VERIFIED WITH: Jene Every PHARMD 1840 03/04/18 A BROWNING    Streptococcus species NOT DETECTED NOT DETECTED Final   Streptococcus agalactiae NOT DETECTED NOT DETECTED Final   Streptococcus pneumoniae NOT DETECTED NOT DETECTED Final   Streptococcus pyogenes NOT DETECTED NOT DETECTED Final   Acinetobacter baumannii NOT DETECTED NOT DETECTED Final   Enterobacteriaceae species DETECTED (A) NOT DETECTED Final    Comment: Enterobacteriaceae represent a large family of gram-negative bacteria, not a single organism. CRITICAL RESULT CALLED TO, READ BACK BY AND VERIFIED WITH: Jene Every PHARMD 1840 03/04/18 A BROWNING    Enterobacter cloacae complex NOT DETECTED NOT DETECTED Final   Escherichia coli NOT DETECTED NOT DETECTED Final   Klebsiella oxytoca NOT DETECTED NOT DETECTED Final   Klebsiella pneumoniae NOT DETECTED NOT DETECTED Final   Proteus species DETECTED (A) NOT DETECTED Final    Comment: CRITICAL RESULT CALLED TO, READ BACK BY AND VERIFIED WITH: Jene Every PHARMD 1840  03/04/18 A BROWNING    Serratia marcescens NOT DETECTED NOT DETECTED Final   Carbapenem resistance NOT DETECTED NOT DETECTED Final   Haemophilus influenzae NOT DETECTED NOT DETECTED Final   Neisseria meningitidis NOT DETECTED NOT DETECTED Final   Pseudomonas aeruginosa NOT DETECTED NOT DETECTED Final   Candida albicans NOT  DETECTED NOT DETECTED Final   Candida glabrata NOT DETECTED NOT DETECTED Final   Candida krusei NOT DETECTED NOT DETECTED Final   Candida parapsilosis NOT DETECTED NOT DETECTED Final   Candida tropicalis NOT DETECTED NOT DETECTED Final    Comment: Performed at [facility], [place] [address], [place], [place] [address]     Studies: No results found.  Scheduled Meds: .  stroke: mapping our early stages of recovery book   Does not apply Once  . sodium chloride  250 mL Intravenous Once  . aspirin  325 mg Oral Daily  . atorvastatin  20 mg Oral q1800  . buPROPion  75 mg Oral BID  . citalopram  40 mg Oral Daily  . famotidine  20 mg Oral Daily  . folic acid  1 mg Oral Daily  . gabapentin  600 mg Oral QHS  . levothyroxine  150 mcg Oral Q0600  . loratadine  10 mg Oral Daily  . midodrine  10 mg Oral Daily  . montelukast  10 mg Oral QHS  . pilocarpine  5 mg Oral BID  . potassium chloride  40 mEq Oral Once  . sodium chloride flush  3 mL Intravenous Once  . sucralfate  1 g Oral TID WC & HS  . vitamin B-12  1,000 mcg Oral Daily    Continuous Infusions: . cefTRIAXone (ROCEPHIN)  IV 2 g (03/06/18 1913)  . vancomycin Stopped (03/06/18 1844)     Time spent: 24mns, case discussed with [facility] Dr GPenelope Coop, neurology, CIR I have personally reviewed and interpreted on  03/07/2018 daily labs, tele strips, imagings as discussed above under date review session and assessment and plans.  I reviewed all nursing notes, pharmacy notes, consultant notes,  vitals, pertinent old records  I have discussed plan of care as described above with RN , patient  on 03/07/2018   FFlorencia ReasonsMD, PhD  [facility] Pager [phone] If 7PM-7AM, please contact night-coverage at [email or web address], password [email or web address]2/21/2020, 10:08 AM  LOS: 4 days

## 2018-03-07 NOTE — Transfer of Care (Signed)
Immediate Anesthesia Transfer of Care Note  Patient: Nancy Norris  Procedure(s) Performed: TRANSESOPHAGEAL ECHOCARDIOGRAM (TEE) (N/A )  Patient Location: Endoscopy Unit  Anesthesia Type:MAC  Level of Consciousness: awake, alert  and oriented  Airway & Oxygen Therapy: Patient Spontanous Breathing  Post-op Assessment: Report given to RN, Post -op Vital signs reviewed and stable and Patient moving all extremities X 4  Post vital signs: Reviewed and stable  Last Vitals:  Vitals Value Taken Time  BP 101/54 03/07/2018  7:56 AM  Temp 36.7 C 03/07/2018  7:56 AM  Pulse 67 03/07/2018  7:56 AM  Resp 23 03/07/2018  7:56 AM  SpO2 94 % 03/07/2018  7:56 AM  Vitals shown include unvalidated device data.  Last Pain:  Vitals:   03/07/18 0756  TempSrc: Oral  PainSc: 0-No pain      Patients Stated Pain Goal: 0 (75/64/33 2951)  Complications: No apparent anesthesia complications

## 2018-03-07 NOTE — Progress Notes (Signed)
PT Cancellation Note  Patient Details Name: Nancy Norris MRN: [id number] DOB: 1940/11/10   Cancelled Treatment:    Reason Eval/Treat Not Completed: Patient at procedure or test/unavailable Pt off floor at TEE. Will follow up as time allows.   Marguarite Arbour A Quintessa Simmerman 03/07/2018, 7:30 AM Wray Kearns, PT, DPT Acute Rehabilitation Services Pager [phone] Office [phone]

## 2018-03-07 NOTE — CV Procedure (Signed)
    TRANSESOPHAGEAL ECHOCARDIOGRAM   NAME:  Nancy Norris   MRN: [id number] DOB:  [birth year]   ADMIT DATE: 03/03/2018  INDICATIONS:   PROCEDURE:   Informed consent was obtained prior to the procedure. The risks, benefits and alternatives for the procedure were discussed and the patient comprehended these risks.  Risks include, but are not limited to, cough, sore throat, vomiting, nausea, somnolence, esophageal and stomach trauma or perforation, bleeding, low blood pressure, aspiration, pneumonia, infection, trauma to the teeth and death.    Procedural time out performed. The oropharynx was anesthetized with topical 1% lidocaine.    During this procedure the patient received moderate sedation by anesthesia.  The transesophageal probe was inserted in the esophagus and stomach without difficulty and multiple views were obtained.    COMPLICATIONS:    There were no immediate complications.  FINDINGS:  LEFT VENTRICLE: EF = 60-65. No regional wall motion abnormalities.  RIGHT VENTRICLE: Normal size and function.   LEFT ATRIUM: No thrombus/mass.  LEFT ATRIAL APPENDAGE: No thrombus/mass.   RIGHT ATRIUM: No thrombus/mass.  AORTIC VALVE:  Trileaflet. No regurgitation. No vegetation.  MITRAL VALVE:    Normal structure. Mild regurgitation. No vegetation.  TRICUSPID VALVE: Normal structure. Trivial regurgitation. No vegetation.  PULMONIC VALVE: Grossly normal structure. No significant regurgitation. No apparent vegetation.  INTERATRIAL SEPTUM: Small PFO or ASD seen by color Doppler.  PERICARDIUM: No effusion noted.  DESCENDING AORTA: Mild diffuse plaque seen   CONCLUSION: Small PFO seen by color Doppler.   Buford Dresser, MD, PhD [facility]  [address] [place], [place] [address] [phone]   7:51 AM

## 2018-03-07 NOTE — Consult Note (Signed)
Subjective:   HPI  The patient is a 78-year-old female who was admitted to the hospital a few days ago with a stroke.  She is doing quite well at this time.  We were asked to see her in regards to an abnormal barium swallow which she had and complaints of dysphagia.  She states that over the past year she has felt the sensation of something in her throat when she swallows.  A barium swallow showed irregularity in the distal esophagus with questionable ulceration and tumor needed to be excluded.  Review of Systems No chest pain or shortness of breath  Past Medical History:  Diagnosis Date  . Anemia   . Anginal pain (HCC)    used NTG x 2 May 31 and 06/15/13   . Anxiety   . Arthritis   . B12 deficiency 12/04/2014  . Breast cancer (HCC)   . Complication of anesthesia   . COPD (chronic obstructive pulmonary disease) (HCC)   . 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 Depression   . Dizziness   . Dyspnea   . Fibromyalgia   . Fibromyalgia   . GERD (gastroesophageal reflux disease)   . Headache(784.0)   . Heart murmur   . Hemoptysis 10/21/2015  . History of blood transfusion    last one May 12   . Hx of cardiovascular stress test    LexiScan with low level exercise Myoview (02/2013): No ischemia, EF 72%; normal study  . Hx of echocardiogram    a.  Echocardiogram (12/26/2012): EF 60-65%, grade 1 diastolic dysfunction;   b.  Echocardiogram (02/2013): EF 55-60%, no WMA, trivial effusion  . Hyperkalemia   . Hyponatremia   . Hypotension   . Hypothyroidism   . Mucositis   . Multiple myeloma   . Myocardial infarction (HCC)    in past, patient was unaware.   . Neuropathy   . Nodule of left lung 09/13/2015  . Pain in joint, pelvic region and thigh 07/07/2015  . Pneumonia    several  . PONV (postoperative nausea and vomiting) 2008   after mastestomy   Past Surgical History:  Procedure Laterality Date  . ABDOMINAL HYSTERECTOMY  1981  . AV FISTULA PLACEMENT Left 06/19/2013   Procedure:  CREATION OF LEFT ARM ARTERIOVENOUS (AV) FISTULA ;  Surgeon: Christopher S Dickson, MD;  Location: [facility] OR;  Service: Vascular;  Laterality: Left;  . BREAST RECONSTRUCTION    . BREAST SURGERY Right    reduction  . CATARACT EXTRACTION, BILATERAL    . CHOLECYSTECTOMY  1971  . COLONOSCOPY    . EYE SURGERY Bilateral    lens implant  . history of Port removal    . LUNG BIOPSY  10/21/2015  . MASTECTOMY Left 2008  . PORTACATH PLACEMENT  12/2012   has had 2  . RESECTION OF ARTERIOVENOUS FISTULA ANEURYSM Left 06/04/2017   Procedure: LIGATION ANEURYSM OF LEFT ARTERIOVENOUS FISTULA;  Surgeon: Dickson, Christopher S, MD;  Location: [facility] OR;  Service: Vascular;  Laterality: Left;  . Status post stem cell transplant on September 28, 2008.     Social History   Socioeconomic History  . Marital status: Single    Spouse name: Not on file  . Number of children: 1  . Years of education: Not on file  . Highest education level: Not on file  Occupational History  . Occupation: retired-sect.(book keeper)  Social Needs  . Financial resource strain: Not on file  . Food insecurity:    Worry: Not on   file    Inability: Not on file  . Transportation needs:    Medical: Yes    Non-medical: No  Tobacco Use  . Smoking status: Former Smoker    Packs/day: 1.00    Years: 30.00    Pack years: 30.00    Types: Cigarettes    Last attempt to quit: 02/15/2005    Years since quitting: 13.0  . Smokeless tobacco: Never Used  Substance and Sexual Activity  . Alcohol use: No  . Drug use: No  . Sexual activity: Never  Lifestyle  . Physical activity:    Days per week: Not on file    Minutes per session: Not on file  . Stress: Not on file  Relationships  . Social connections:    Talks on phone: Not on file    Gets together: Not on file    Attends religious service: Not on file    Active member of club or organization: Not on file    Attends meetings of clubs or organizations: Not on file    Relationship status:  Not on file  . Intimate partner violence:    Fear of current or ex partner: Not on file    Emotionally abused: Not on file    Physically abused: Not on file    Forced sexual activity: Not on file  Other Topics Concern  . Not on file  Social History Narrative  . Not on file   family history includes Arthritis in her mother; Asthma in her mother; Cancer in her sister; Hyperlipidemia in her brother.  Current Facility-Administered Medications:  .   stroke: mapping our early stages of recovery book, , Does not apply, Once, Christopher, Bridgette, MD .  0.9 %  sodium chloride infusion (Manually program via Guardrails IV Fluids), 250 mL, Intravenous, Once, Christopher, Bridgette, MD, Last Rate: 10 mL/hr at 03/05/18 2227 .  acetaminophen (TYLENOL) tablet 650 mg, 650 mg, Oral, Q6H PRN, 650 mg at 03/06/18 2231 **OR** acetaminophen (TYLENOL) suppository 650 mg, 650 mg, Rectal, Q6H PRN, Christopher, Bridgette, MD .  albuterol (PROVENTIL) (2.5 MG/3ML) 0.083% nebulizer solution 3 mL, 3 mL, Inhalation, Q6H PRN, Christopher, Bridgette, MD .  aspirin tablet 325 mg, 325 mg, Oral, Daily, Christopher, Bridgette, MD, 325 mg at 03/07/18 0833 .  atorvastatin (LIPITOR) tablet 20 mg, 20 mg, Oral, q1800, Christopher, Bridgette, MD, 20 mg at 03/06/18 1712 .  buPROPion (WELLBUTRIN) tablet 75 mg, 75 mg, Oral, BID, Christopher, Bridgette, MD, 75 mg at 03/07/18 0832 .  cephALEXin (KEFLEX) capsule 250 mg, 250 mg, Oral, Q8H, Xu, Fang, MD .  citalopram (CELEXA) tablet 40 mg, 40 mg, Oral, Daily, Christopher, Bridgette, MD, 40 mg at 03/07/18 0833 .  cromolyn (OPTICROM) 4 % ophthalmic solution 1-2 drop, 1-2 drop, Both Eyes, QID PRN, Christopher, Bridgette, MD .  famotidine (PEPCID) tablet 20 mg, 20 mg, Oral, Daily, Christopher, Bridgette, MD, 20 mg at 03/07/18 1147 .  folic acid (FOLVITE) tablet 1 mg, 1 mg, Oral, Daily, Christopher, Bridgette, MD, 1 mg at 03/07/18 1146 .  gabapentin (NEURONTIN) capsule 300 mg, 300 mg, Oral,  Daily PRN, Christopher, Bridgette, MD .  gabapentin (NEURONTIN) capsule 600 mg, 600 mg, Oral, QHS, Christopher, Bridgette, MD, 600 mg at 03/06/18 2120 .  levothyroxine (SYNTHROID, LEVOTHROID) tablet 150 mcg, 150 mcg, Oral, Q0600, Christopher, Bridgette, MD, 150 mcg at 03/07/18 0531 .  loratadine (CLARITIN) tablet 10 mg, 10 mg, Oral, Daily, Christopher, Bridgette, MD, 10 mg at 03/07/18 0833 .  midodrine (PROAMATINE) tablet 10 mg, 10   mg, Oral, Daily, Christopher, Bridgette, MD, 10 mg at 03/07/18 0833 .  montelukast (SINGULAIR) tablet 10 mg, 10 mg, Oral, QHS, Christopher, Bridgette, MD, 10 mg at 03/06/18 2120 .  pilocarpine (SALAGEN) tablet 5 mg, 5 mg, Oral, BID, Christopher, Bridgette, MD, 5 mg at 03/07/18 0833 .  sodium chloride flush (NS) 0.9 % injection 3 mL, 3 mL, Intravenous, Once, Christopher, Bridgette, MD .  sucralfate (CARAFATE) tablet 1 g, 1 g, Oral, TID WC & HS, Christopher, Bridgette, MD, 1 g at 03/07/18 1146 .  traMADol (ULTRAM) tablet 50 mg, 50 mg, Oral, Q6H PRN, Christopher, Bridgette, MD, 50 mg at 03/05/18 1304 .  vitamin B-12 (CYANOCOBALAMIN) tablet 1,000 mcg, 1,000 mcg, Oral, Daily, Christopher, Bridgette, MD, 1,000 mcg at 03/07/18 1147 Allergies  Allergen Reactions  . Codeine Anaphylaxis    anaphylasix tolarates oxycodone per care cast  . Latex Shortness Of Breath  . Chocolate     migraines  . Onion Other (See Comments)    Causes migraine headaches  . Zyprexa [Olanzapine] Other (See Comments)    Confusion , dizzy,unsteady  . Adhesive [Tape] Other (See Comments)    blisters  . Heparin Rash    Can flush port with heparin  . Hydrocodone Rash    With extreme itching  . Iodinated Diagnostic Agents Hives, Itching, Rash and Other (See Comments)    Happened 60 years ago Stage IV kidney function  . Oxycodone Rash  . Promethazine-Dm Hives  . Sulfa Antibiotics Itching     Objective:     BP (!) 149/76 (BP Location: Left Arm)   Pulse 66   Temp 98.2 F (36.8 C) (Oral)    Resp 18   Ht 5' 5" (1.651 m)   Wt 79.3 kg   SpO2 98%   BMI 29.09 kg/m   No distress  Nonicteric  Heart regular rhythm no murmurs  Lungs clear  Abdomen soft and nontender  Laboratory No components found for: D1    Assessment:     Dysphagia  Abnormal barium swallow      Plan:     We will plan EGD tomorrow to further investigate. 

## 2018-03-07 NOTE — H&P (View-Only) (Signed)
Subjective:   HPI  The patient is a 78 year old female who was admitted to the hospital a few days ago with a stroke.  She is doing quite well at this time.  We were asked to see her in regards to an abnormal barium swallow which she had and complaints of dysphagia.  She states that over the past year she has felt the sensation of something in her throat when she swallows.  A barium swallow showed irregularity in the distal esophagus with questionable ulceration and tumor needed to be excluded.  Review of Systems No chest pain or shortness of breath  Past Medical History:  Diagnosis Date  . Anemia   . Anginal pain ([place])    used NTG x 2 May 31 and 06/15/13   . Anxiety   . Arthritis   . B12 deficiency 12/04/2014  . Breast cancer ([place])   . Complication of anesthesia   . COPD (chronic obstructive pulmonary disease) ([place])   . Cryptococcal pneumonitis ([place]) 11/22/2015  . Depression   . Dizziness   . Dyspnea   . Fibromyalgia   . Fibromyalgia   . GERD (gastroesophageal reflux disease)   . Headache(784.0)   . Heart murmur   . Hemoptysis 10/21/2015  . History of blood transfusion    last one May 12   . Hx of cardiovascular stress test    LexiScan with low level exercise Myoview (02/2013): No ischemia, EF 72%; normal study  . Hx of echocardiogram    a.  Echocardiogram (12/26/2012): EF 97-35%, grade 1 diastolic dysfunction;   b.  Echocardiogram (02/2013): EF 55-60%, no WMA, trivial effusion  . Hyperkalemia   . Hyponatremia   . Hypotension   . Hypothyroidism   . Mucositis   . Multiple myeloma   . Myocardial infarction [facility])    in past, patient was unaware.   . Neuropathy   . Nodule of left lung 09/13/2015  . Pain in joint, pelvic region and thigh 07/07/2015  . Pneumonia    several  . PONV (postoperative nausea and vomiting) 2008   after mastestomy   Past Surgical History:  Procedure Laterality Date  . ABDOMINAL HYSTERECTOMY  1981  . AV FISTULA PLACEMENT Left 06/19/2013   Procedure:  CREATION OF LEFT ARM ARTERIOVENOUS (AV) FISTULA ;  Surgeon: Angelia Mould, MD;  Location: [place];  Service: Vascular;  Laterality: Left;  . BREAST RECONSTRUCTION    . BREAST SURGERY Right    reduction  . CATARACT EXTRACTION, BILATERAL    . CHOLECYSTECTOMY  1971  . COLONOSCOPY    . EYE SURGERY Bilateral    lens implant  . history of Port removal    . LUNG BIOPSY  10/21/2015  . MASTECTOMY Left 2008  . PORTACATH PLACEMENT  12/2012   has had 2  . RESECTION OF ARTERIOVENOUS FISTULA ANEURYSM Left 06/04/2017   Procedure: LIGATION ANEURYSM OF LEFT ARTERIOVENOUS FISTULA;  Surgeon: Angelia Mould, MD;  Location: [place];  Service: Vascular;  Laterality: Left;  . Status post stem cell transplant on September 28, 2008.     Social History   Socioeconomic History  . Marital status: Single    Spouse name: Not on file  . Number of children: 1  . Years of education: Not on file  . Highest education level: Not on file  Occupational History  . Occupation: retired-sect.Licensed conveyancer)  Social Needs  . Financial resource strain: Not on file  . Food insecurity:    Worry: Not on  file    Inability: Not on file  . Transportation needs:    Medical: Yes    Non-medical: No  Tobacco Use  . Smoking status: Former Smoker    Packs/day: 1.00    Years: 30.00    Pack years: 30.00    Types: Cigarettes    Last attempt to quit: 02/15/2005    Years since quitting: 13.0  . Smokeless tobacco: Never Used  Substance and Sexual Activity  . Alcohol use: No  . Drug use: No  . Sexual activity: Never  Lifestyle  . Physical activity:    Days per week: Not on file    Minutes per session: Not on file  . Stress: Not on file  Relationships  . Social connections:    Talks on phone: Not on file    Gets together: Not on file    Attends religious service: Not on file    Active member of club or organization: Not on file    Attends meetings of clubs or organizations: Not on file    Relationship status:  Not on file  . Intimate partner violence:    Fear of current or ex partner: Not on file    Emotionally abused: Not on file    Physically abused: Not on file    Forced sexual activity: Not on file  Other Topics Concern  . Not on file  Social History Narrative  . Not on file   family history includes Arthritis in her mother; Asthma in her mother; Cancer in her sister; Hyperlipidemia in her brother.  Current Facility-Administered Medications:  .   stroke: mapping our early stages of recovery book, , Does not apply, Once, Buford Dresser, MD .  0.9 %  sodium chloride infusion (Manually program via Guardrails IV Fluids), 250 mL, Intravenous, Once, Buford Dresser, MD, Last Rate: 10 mL/hr at 03/05/18 2227 .  acetaminophen (TYLENOL) tablet 650 mg, 650 mg, Oral, Q6H PRN, 650 mg at 03/06/18 2231 **OR** acetaminophen (TYLENOL) suppository 650 mg, 650 mg, Rectal, Q6H PRN, Buford Dresser, MD .  albuterol (PROVENTIL) (2.5 MG/3ML) 0.083% nebulizer solution 3 mL, 3 mL, Inhalation, Q6H PRN, Buford Dresser, MD .  aspirin tablet 325 mg, 325 mg, Oral, Daily, Buford Dresser, MD, 325 mg at 03/07/18 [phone]  atorvastatin (LIPITOR) tablet 20 mg, 20 mg, Oral, q1800, Buford Dresser, MD, 20 mg at 03/06/18 1712 .  buPROPion [facility]) tablet 75 mg, 75 mg, Oral, BID, Buford Dresser, MD, 75 mg at 03/07/18 9030 .  cephALEXin (KEFLEX) capsule 250 mg, 250 mg, Oral, Q8H, Florencia Reasons, MD .  citalopram (CELEXA) tablet 40 mg, 40 mg, Oral, Daily, Buford Dresser, MD, 40 mg at 03/07/18 [phone]  cromolyn (OPTICROM) 4 % ophthalmic solution 1-2 drop, 1-2 drop, Both Eyes, QID PRN, Buford Dresser, MD .  famotidine (PEPCID) tablet 20 mg, 20 mg, Oral, Daily, Buford Dresser, MD, 20 mg at 03/07/18 1147 .  folic acid (FOLVITE) tablet 1 mg, 1 mg, Oral, Daily, Buford Dresser, MD, 1 mg at 03/07/18 1146 .  gabapentin (NEURONTIN) capsule 300 mg, 300 mg, Oral,  Daily PRN, Buford Dresser, MD .  gabapentin (NEURONTIN) capsule 600 mg, 600 mg, Oral, QHS, Buford Dresser, MD, 600 mg at 03/06/18 2120 .  levothyroxine (SYNTHROID, LEVOTHROID) tablet 150 mcg, 150 mcg, Oral, Q0600, Buford Dresser, MD, 150 mcg at 03/07/18 0531 .  loratadine (CLARITIN) tablet 10 mg, 10 mg, Oral, Daily, Buford Dresser, MD, 10 mg at 03/07/18 [phone] .  midodrine (PROAMATINE) tablet 10 mg, 10  mg, Oral, Daily, Buford Dresser, MD, 10 mg at 03/07/18 [phone] .  montelukast (SINGULAIR) tablet 10 mg, 10 mg, Oral, QHS, Buford Dresser, MD, 10 mg at 03/06/18 2120 .  pilocarpine (SALAGEN) tablet 5 mg, 5 mg, Oral, BID, Buford Dresser, MD, 5 mg at 03/07/18 [phone] .  sodium chloride flush (NS) 0.9 % injection 3 mL, 3 mL, Intravenous, Once, Buford Dresser, MD .  sucralfate (CARAFATE) tablet 1 g, 1 g, Oral, TID WC & HS, Buford Dresser, MD, 1 g at 03/07/18 1146 .  traMADol (ULTRAM) tablet 50 mg, 50 mg, Oral, Q6H PRN, Buford Dresser, MD, 50 mg at 03/05/18 1304 .  vitamin B-12 (CYANOCOBALAMIN) tablet 1,000 mcg, 1,000 mcg, Oral, Daily, Buford Dresser, MD, 1,000 mcg at 03/07/18 1147 Allergies  Allergen Reactions  . Codeine Anaphylaxis    anaphylasix tolarates oxycodone per care cast  . Latex Shortness Of Breath  . Chocolate     migraines  . Onion Other (See Comments)    Causes migraine headaches  . Zyprexa [Olanzapine] Other (See Comments)    Confusion , dizzy,unsteady  . Adhesive [Tape] Other (See Comments)    blisters  . Heparin Rash    Can flush port with heparin  . Hydrocodone Rash    With extreme itching  . Iodinated Diagnostic Agents Hives, Itching, Rash and Other (See Comments)    Happened 60 years ago Stage IV kidney function  . Oxycodone Rash  . Promethazine-Dm Hives  . Sulfa Antibiotics Itching     Objective:     BP (!) 149/76 (BP Location: Left Arm)   Pulse 66   Temp 98.2 F (36.8 C) (Oral)    Resp 18   Ht 5' 5" (1.651 m)   Wt 79.3 kg   SpO2 98%   BMI 29.09 kg/m   No distress  Nonicteric  Heart regular rhythm no murmurs  Lungs clear  Abdomen soft and nontender  Laboratory No components found for: D1    Assessment:     Dysphagia  Abnormal barium swallow      Plan:     We will plan EGD tomorrow to further investigate.

## 2018-03-07 NOTE — Anesthesia Postprocedure Evaluation (Signed)
Anesthesia Post Note  Patient: JAKAYLEE SASAKI  Procedure(s) Performed: TRANSESOPHAGEAL ECHOCARDIOGRAM (TEE) (N/A )     Patient location during evaluation: PACU Anesthesia Type: MAC Level of consciousness: awake and alert Pain management: pain level controlled Vital Signs Assessment: post-procedure vital signs reviewed and stable Respiratory status: spontaneous breathing, nonlabored ventilation and respiratory function stable Cardiovascular status: stable and blood pressure returned to baseline Postop Assessment: no apparent nausea or vomiting Anesthetic complications: no    Last Vitals:  Vitals:   03/07/18 0756 03/07/18 0805  BP: (!) 101/54 116/61  Pulse:  67  Resp: (!) 24 13  Temp: 36.7 C   SpO2: 93% 97%    Last Pain:  Vitals:   03/07/18 0831  TempSrc:   PainSc: 0-No pain                 Lynda Rainwater

## 2018-03-07 NOTE — Progress Notes (Addendum)
STROKE TEAM PROGRESS NOTE   INTERVAL HISTORY No family is at the bedside.  TEE is pending. . Transthoracic echo was unremarkable.  Vitals:   03/07/18 0659 03/07/18 0756 03/07/18 0805 03/07/18 1107  BP: (!) 152/73 (!) 101/54 116/61 (!) 149/76  Pulse:   67 66  Resp: 15 (!) '24 13 18  '$ Temp: 98.8 F (37.1 C) 98 F (36.7 C)  98.2 F (36.8 C)  TempSrc: Oral Oral  Oral  SpO2: 99% 93% 97% 98%  Weight: 79.3 kg     Height: '5\' 5"'$  (1.651 m)       CBC:  Recent Labs  Lab 03/05/18 1106 03/06/18 0500 03/07/18 0550  WBC 2.0* 2.0* 2.3*  NEUTROABS 0.8* 0.8*  --   HGB 7.0* 7.2* 7.4*  HCT 23.0* 22.1* 23.3*  MCV 103.1* 101.4* 102.6*  PLT 62* 66* 72*    Basic Metabolic Panel:  Recent Labs  Lab 03/06/18 0500 03/07/18 0550  NA 140 140  K 4.8 3.3*  CL 106 106  CO2 22 26  GLUCOSE 100* 101*  BUN 17 16  CREATININE 2.56* 2.76*  CALCIUM 6.1* 8.5*   Lipid Panel:     Component Value Date/Time   CHOL 141 03/06/2018 0500   TRIG 151 (H) 03/06/2018 0500   HDL 26 (L) 03/06/2018 0500   CHOLHDL 5.4 03/06/2018 0500   VLDL 30 03/06/2018 0500   LDLCALC 85 03/06/2018 0500   LDLCALC 128 (H) 12/18/2017 1137   HgbA1c:  Lab Results  Component Value Date   HGBA1C 5.1 03/06/2018   Alcohol Level     Component Value Date/Time   ETH <10 03/03/2018 1244    PHYSICAL EXAM  pleasant elderly Caucasian lady currently not in distress. . Afebrile. Head is nontraumatic. Neck is supple without bruit.    Cardiac exam no murmur or gallop. Lungs are clear to auscultation. Distal pulses are well felt.she has a Port-A-Cath in the right infraclavicular region Neurological Exam ;  Awake alert oriented to time place and person. Diminished attention, recall. Follows commands well. No aphasia, apraxia dysarthria. Extraocular moments are full range without nystagmus. Dense right homonymous hemianopsia. Fundi not visualized. Vision acuity seems adequate. Face is symmetric without weakness. Tongue is midline. Motor  system exam reveals symmetric upper and lower extremity strength without focal weakness. Deep tendon reflexes are symmetric. Plantars are downgoing. Gait not tested.   ASSESSMENT/PLAN Nancy Norris is a 78 y.o. female with history of remote breast cancer, COPD, CKD stage IV, MI/CAD, multiple myeloma in remission admitted for 36hr amnesia 2 weeks ago, confusion last week with UTI, and headache for the last two days presenting with amnesia, confusion, and headache.   Stroke:  bilateral cortical and subcortical infarcts and various territories felt to be embolic secondary to unknown source  CT head abnormal cortical and subcortical edema in left parietal and left occipital lobe not present on prior CTs, infarct versus metastatic disease  MRI without contrast multifocal bilateral acute ischemia within multiple vascular territories  MRA normal  Carotid Doppler 5.1  2D Echo ejection fraction 60-65%.  TEE EF > 65%, small PFO  Will not consider loop given pt not an AC candidate  LDL 85 mg percent  HgbA1c 5.1  SCDs for VTE prophylaxis  No antithrombotic prior to admission, now on aspirin 325 mg daily. Continue aspirin. Concern to treat with DAPT given risk of hemorrhage .  BP goal normotensive  Therapy recommendations: CIR  Disposition: CIR  Other Stroke Risk Factors  Advanced  age  Former Cigarette smoker, quit 13 yrs ago  Overweight, Body mass index is 29.09 kg/m., recommend weight loss, diet and exercise as appropriate   Other Active Problems  History of breast cancer  Pancytopenia  / multiple myeloma in remission - on treatment with systemic chemotherapy with Carfilzomib, Cytoxan and Decadron status post 17 cycles.  She has been off treatment for the last 12 months.  CKD stage IV  Depression on Wellbutrin  COPD  Hypothyroidism  Ok for d/c from stroke standpoint Follow-up Stroke Clinic at [facility] in 4 weeks. Office will call with  appointment date and time. Order placed.   Hospital day # [place], MSN, APRN, ANVP-BC, AGPCNP-BC Advanced Practice Stroke Nurse [place] for Schedule & Pager information 03/07/2018 11:38 AM  I have personally obtained history,examined this patient, reviewed notes, independently viewed imaging studies, participated in medical decision making and plan of care.ROS completed by me personally and pertinent positives fully documented  I have made any additions or clarifications directly to the above note. Agree with note above.    Antony Contras, MD Medical Director [facility] Pager: [phone] 03/07/2018 4:07 PM  To contact Stroke Continuity provider, please refer to [email or web address] After hours, contact General Neurology

## 2018-03-08 ENCOUNTER — Encounter (HOSPITAL_COMMUNITY): Payer: Self-pay | Admitting: Certified Registered"

## 2018-03-08 ENCOUNTER — Inpatient Hospital Stay (HOSPITAL_COMMUNITY): Payer: Medicare Other | Admitting: Certified Registered"

## 2018-03-08 ENCOUNTER — Inpatient Hospital Stay (HOSPITAL_COMMUNITY): Payer: Medicare Other

## 2018-03-08 ENCOUNTER — Encounter (HOSPITAL_COMMUNITY)
Admission: EM | Disposition: A | Discharge status: Rehabilitation Facility | Payer: Self-pay | Source: Home / Self Care | Attending: Internal Medicine

## 2018-03-08 DIAGNOSIS — I82402 Acute embolism and thrombosis of unspecified deep veins of left lower extremity: Secondary | ICD-10-CM

## 2018-03-08 DIAGNOSIS — D490 Neoplasm of unspecified behavior of digestive system: Secondary | ICD-10-CM

## 2018-03-08 DIAGNOSIS — Z87891 Personal history of nicotine dependence: Secondary | ICD-10-CM

## 2018-03-08 DIAGNOSIS — I82409 Acute embolism and thrombosis of unspecified deep veins of unspecified lower extremity: Secondary | ICD-10-CM

## 2018-03-08 DIAGNOSIS — C169 Malignant neoplasm of stomach, unspecified: Secondary | ICD-10-CM

## 2018-03-08 DIAGNOSIS — I82442 Acute embolism and thrombosis of left tibial vein: Secondary | ICD-10-CM

## 2018-03-08 HISTORY — PX: ESOPHAGOGASTRODUODENOSCOPY (EGD) WITH PROPOFOL: SHX5813

## 2018-03-08 HISTORY — PX: BIOPSY: SHX5522

## 2018-03-08 LAB — CBC WITH DIFFERENTIAL/PLATELET
Abs Immature Granulocytes: 0.03 10*3/uL (ref 0.00–0.07)
Basophils Absolute: 0 10*3/uL (ref 0.0–0.1)
Basophils Relative: 0 %
Eosinophils Absolute: 0.2 10*3/uL (ref 0.0–0.5)
Eosinophils Relative: 6 %
HEMATOCRIT: 24.1 % — AB (ref 36.0–46.0)
HEMOGLOBIN: 7.3 g/dL — AB (ref 12.0–15.0)
Immature Granulocytes: 1 %
Lymphocytes Relative: 26 %
Lymphs Abs: 0.7 10*3/uL (ref 0.7–4.0)
MCH: 31.3 pg (ref 26.0–34.0)
MCHC: 30.3 g/dL (ref 30.0–36.0)
MCV: 103.4 fL — ABNORMAL HIGH (ref 80.0–100.0)
MONOS PCT: 23 %
Monocytes Absolute: 0.6 10*3/uL (ref 0.1–1.0)
Neutro Abs: 1.2 10*3/uL — ABNORMAL LOW (ref 1.7–7.7)
Neutrophils Relative %: 44 %
Platelets: 72 10*3/uL — ABNORMAL LOW (ref 150–400)
RBC: 2.33 MIL/uL — ABNORMAL LOW (ref 3.87–5.11)
RDW: 24.2 % — ABNORMAL HIGH (ref 11.5–15.5)
WBC: 2.7 10*3/uL — ABNORMAL LOW (ref 4.0–10.5)
nRBC: 0 % (ref 0.0–0.2)

## 2018-03-08 LAB — BASIC METABOLIC PANEL
Anion gap: 11 (ref 5–15)
BUN: 14 mg/dL (ref 8–23)
CO2: 21 mmol/L — ABNORMAL LOW (ref 22–32)
Calcium: 8.2 mg/dL — ABNORMAL LOW (ref 8.9–10.3)
Chloride: 108 mmol/L (ref 98–111)
Creatinine, Ser: 2.47 mg/dL — ABNORMAL HIGH (ref 0.44–1.00)
GFR calc Af Amer: 21 mL/min — ABNORMAL LOW (ref >60)
GFR calc non Af Amer: 18 mL/min — ABNORMAL LOW (ref >60)
Glucose, Bld: 95 mg/dL (ref 70–99)
Potassium: 3.6 mmol/L (ref 3.5–5.1)
Sodium: 140 mmol/L (ref 135–145)

## 2018-03-08 LAB — CULTURE, BLOOD (ROUTINE X 2)
Culture: NO GROWTH
Special Requests: ADEQUATE

## 2018-03-08 SURGERY — ESOPHAGOGASTRODUODENOSCOPY (EGD) WITH PROPOFOL
Anesthesia: Monitor Anesthesia Care

## 2018-03-08 MED ORDER — PROPOFOL 500 MG/50ML IV EMUL
INTRAVENOUS | Status: DC | PRN
  Administered 2018-03-08: 75 ug/kg/min via INTRAVENOUS

## 2018-03-08 MED ORDER — SUCRALFATE 1 GM/10ML PO SUSP
1.0000 g | Freq: Three times a day (TID) | ORAL | Status: DC
  Administered 2018-03-08 – 2018-03-11 (×8): 1 g via ORAL
  Filled 2018-03-08 (×9): qty 10

## 2018-03-08 MED ORDER — EPHEDRINE SULFATE-NACL 50-0.9 MG/10ML-% IV SOSY
PREFILLED_SYRINGE | INTRAVENOUS | Status: DC | PRN
  Administered 2018-03-08 (×2): 5 mg via INTRAVENOUS

## 2018-03-08 MED ORDER — LIDOCAINE 2% (20 MG/ML) 5 ML SYRINGE
INTRAMUSCULAR | Status: DC | PRN
  Administered 2018-03-08: 60 mg via INTRAVENOUS

## 2018-03-08 MED ORDER — SODIUM CHLORIDE 0.9 % IV SOLN
INTRAVENOUS | Status: DC
  Administered 2018-03-08: 08:00:00 via INTRAVENOUS

## 2018-03-08 MED ORDER — BARIUM SULFATE 2.1 % PO SUSP
ORAL | Status: AC
  Administered 2018-03-08: 15:00:00
  Filled 2018-03-08: qty 2

## 2018-03-08 MED ORDER — PROPOFOL 10 MG/ML IV BOLUS
INTRAVENOUS | Status: DC | PRN
  Administered 2018-03-08: 20 mg via INTRAVENOUS
  Administered 2018-03-08: 40 mg via INTRAVENOUS

## 2018-03-08 SURGICAL SUPPLY — 15 items

## 2018-03-08 NOTE — Brief Op Note (Signed)
Large fungating malignant-appearing ulcerated proximal stomach mass that has areas of blood seen consistent with recent bleeding. Biopsies taken. Mass likely causing partial obstruction with PO intake. Needs a CT of the abdomen to look for liver mets but due to renal insufficiency will need to be without IV contrast. Surgical consult and oncology consult needed when path results are complete. Clear liquids ok. D/W Dr. Erlinda Hong. See endopro note for details.

## 2018-03-08 NOTE — Transfer of Care (Signed)
Immediate Anesthesia Transfer of Care Note  Patient: Nancy Norris  Procedure(s) Performed: ESOPHAGOGASTRODUODENOSCOPY (EGD) WITH PROPOFOL (N/A ) BIOPSY  Patient Location: Endoscopy Unit  Anesthesia Type:MAC  Level of Consciousness: awake, alert  and oriented  Airway & Oxygen Therapy: Patient Spontanous Breathing  Post-op Assessment: Report given to RN  Post vital signs: Reviewed and stable  Last Vitals:  Vitals Value Taken Time  BP 102/51 03/08/2018  8:47 AM  Temp 36.6 C 03/08/2018  8:47 AM  Pulse 75 03/08/2018  8:48 AM  Resp 18 03/08/2018  8:48 AM  SpO2 97 % 03/08/2018  8:48 AM  Vitals shown include unvalidated device data.  Last Pain:  Vitals:   03/08/18 0847  TempSrc: Oral  PainSc: 0-No pain      Patients Stated Pain Goal: 0 (76/18/48 5927)  Complications: No apparent anesthesia complications

## 2018-03-08 NOTE — Op Note (Signed)
[facility] Patient Name: Nancy Norris Procedure Date : 03/08/2018 MRN: [id number] Attending MD: Lear Ng , MD Date of Birth: Aug 22, 1940 CSN: [id number] Age: 78 Admit Type: Inpatient Procedure:                Upper GI endoscopy Indications:              Dysphagia, Abnormal UGI series Providers:                Lear Ng, MD, Vista Lawman, RN, Tinnie Gens, Technician, Sampson Si, CRNA Referring MD:             hospital team Medicines:                Propofol per Anesthesia, Monitored Anesthesia Care Complications:            No immediate complications. Estimated Blood Loss:     Estimated blood loss was minimal. Procedure:                Pre-Anesthesia Assessment:                           - Prior to the procedure, a History and Physical                            was performed, and patient medications and                            allergies were reviewed. The patient's tolerance of                            previous anesthesia was also reviewed. The risks                            and benefits of the procedure and the sedation                            options and risks were discussed with the patient.                            All questions were answered, and informed consent                            was obtained. Prior Anticoagulants: The patient has                            taken no previous anticoagulant or antiplatelet                            agents. ASA Grade Assessment: III - A patient with                            severe systemic disease. After reviewing the risks  and benefits, the patient was deemed in                            satisfactory condition to undergo the procedure.                           After obtaining informed consent, the endoscope was                            passed under direct vision. Throughout the                            procedure, the patient's  blood pressure, pulse, and                            oxygen saturations were monitored continuously. The                            [id number]-H190 ([id number] Olympus gastroscope was                            introduced through the mouth, and advanced to the                            second part of duodenum. The upper GI endoscopy was                            accomplished without difficulty. The patient                            tolerated the procedure well. Scope In: Scope Out: Findings:      The examined esophagus was normal.      The Z-line was regular and was found 35 cm from the incisors.      A medium-sized hiatal hernia was present (35 cm - 38 cm from the       incisors).      A large, fungating, infiltrative and ulcerated, partially       circumferential (involving one-third of the lumen circumference) mass       with oozing bleeding and stigmata of recent bleeding was found in the       gastric fundus. Biopsies were taken with a cold forceps for histology.       Estimated blood loss was minimal.      Localized mild inflammation characterized by congestion (edema) and       erythema was found in the prepyloric region of the stomach.      The examined duodenum was normal. Impression:               - Normal esophagus.                           - Z-line regular, 35 cm from the incisors.                           - Medium-sized hiatal hernia.                           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                            Biopsied.                           - Acute gastritis.                           - Normal examined duodenum. Recommendation:           - Patient has a contact number available for                            emergencies. The signs and symptoms of potential                            delayed complications were discussed with the                            patient. Return to normal activities tomorrow.                            Written discharge  instructions were provided to the                            patient.                           - Clear liquid diet.                           - Await pathology results.                           - Refer to a surgeon when pathology results are                            complete. Procedure Code(s):        --- Professional ---                           [phone], Esophagogastroduodenoscopy, flexible,                            transoral; with biopsy, single or multiple Diagnosis Code(s):        --- Professional ---                           R13.10, Dysphagia, unspecified                           C16.1, Malignant neoplasm of fundus of stomach                           K29.00, Acute gastritis without bleeding  K44.9, Diaphragmatic hernia without obstruction or                            gangrene                           R93.3, Abnormal findings on diagnostic imaging of                            other parts of digestive tract CPT copyright 2018 American Medical Association. All rights reserved. The codes documented in this report are preliminary and upon coder review may  be revised to meet current compliance requirements. Lear Ng, MD 03/08/2018 8:53:28 AM This report has been signed electronically. Number of Addenda: 0

## 2018-03-08 NOTE — Interval H&P Note (Signed)
History and Physical Interval Note:  03/08/2018 8:20 AM  Nancy Norris  has presented today for surgery, with the diagnosis of dysphagia, abnormal barium swallow  The various methods of treatment have been discussed with the patient and family. After consideration of risks, benefits and other options for treatment, the patient has consented to  Procedure(s): ESOPHAGOGASTRODUODENOSCOPY (EGD) WITH PROPOFOL (N/A) as a surgical intervention .  The patient's history has been reviewed, patient examined, no change in status, stable for surgery.  I have reviewed the patient's chart and labs.  Questions were answered to the patient's satisfaction.     Lear Ng

## 2018-03-08 NOTE — Anesthesia Procedure Notes (Signed)
Procedure Name: MAC Date/Time: 03/08/2018 8:21 AM Performed by: Barrington Ellison, CRNA Pre-anesthesia Checklist: Patient identified, Emergency Drugs available, Suction available, Patient being monitored and Timeout performed Patient Re-evaluated:Patient Re-evaluated prior to induction Oxygen Delivery Method: Nasal cannula

## 2018-03-08 NOTE — Anesthesia Preprocedure Evaluation (Addendum)
Anesthesia Evaluation  Patient identified by MRN, date of birth, ID band Patient awake    Reviewed: Allergy & Precautions, NPO status , Patient's Chart, lab work & pertinent test results  History of Anesthesia Complications (+) PONV and history of anesthetic complications  Airway Mallampati: III  TM Distance: <3 FB Neck ROM: Full    Dental  (+) Poor Dentition, Chipped, Missing   Pulmonary shortness of breath, asthma , former smoker,    breath sounds clear to auscultation       Cardiovascular hypertension, Pt. on medications (-) angina+ Past MI   Rhythm:Regular     Neuro/Psych  Headaches, Anxiety Depression CVA 1 week ago  Neuromuscular disease CVA, No Residual Symptoms    GI/Hepatic GERD  ,  Endo/Other  Hypothyroidism   Renal/GU CRFRenal disease     Musculoskeletal  (+) Arthritis , Fibromyalgia -  Abdominal   Peds  Hematology  (+) anemia ,   Anesthesia Other Findings 1. Small patent foramen ovale with predominantly left to right shunting across the atrial septum.  2. Evidence of atrial level shunting detected by color flow Doppler.  3. The left ventricle has normal systolic function, with an ejection fraction of 60-65%. The cavity size was normal. No evidence of left ventricular regional wall motion abnormalities.  4. The right ventricle has normal systolc function. The cavity was normal. There is no increase in right ventricular wall thickness.  5. The mitral valve is normal in structure. Mild thickening of the mitral valve leaflet.  6. The tricuspid valve was normal in structure.  7. The aortic valve is tricuspid.  8. There is evidence of plaque in the descending aorta.  9. No evidence of left ventricular regional wall motion abnormalities.  Reproductive/Obstetrics                            Anesthesia Physical Anesthesia Plan  ASA: III  Anesthesia Plan: MAC   Post-op Pain  Management:    Induction: Intravenous  PONV Risk Score and Plan: 3 and Treatment may vary due to age or medical condition and Propofol infusion  Airway Management Planned: Nasal Cannula  Additional Equipment:   Intra-op Plan:   Post-operative Plan:   Informed Consent: I have reviewed the patients History and Physical, chart, labs and discussed the procedure including the risks, benefits and alternatives for the proposed anesthesia with the patient or authorized representative who has indicated his/her understanding and acceptance.     Dental advisory given  Plan Discussed with: CRNA and Surgeon  Anesthesia Plan Comments:         Anesthesia Quick Evaluation

## 2018-03-08 NOTE — Progress Notes (Signed)
PROGRESS NOTE  Nancy Norris CVE:[id number] DOB: 03/06/1940 DOA: 03/03/2018 PCP: [name], MD  HPI/Recap of past 24 hours:   Returned from EGD, has "Large fungating malignant-appearing ulcerated proximal stomach mass that has areas of blood seen consistent with recent bleeding: found on EGd today She reports chronic dysphagia, no vomiting, no frank bleeding  Denies headache, she is aaox3 today She reports  Confusion (presenting symptom) almost resolved, speech is improving, but frustrated about her writing,  Denies pain today,  Daughter at bedside    Assessment/Plan: Principal Problem:   Stroke [facility]) Active Problems:   HX: breast cancer   Hypothyroid   GERD (gastroesophageal reflux disease)   COPD (chronic obstructive pulmonary disease) with emphysema (HCC)   Multiple myeloma (HCC)   Thrombocytopenia (HCC)   Symptomatic anemia   Dysphagia   CKD (chronic kidney disease), stage IV ([place])   Acute cardioembolic stroke ([place])   CKD (chronic kidney disease), stage III ([place])  Acute bilateral multifocal CVA (presenting symptom): -patient presented with encaphlopathy/altered mental status -Multifocal bilateral acute ischemia within multiple vascular territories in both hemispheres. This may indicate a central cardiac or aortic embolic source. -MRA "Normal intracranial MRA." -has been sinus rhythm on tele -TEE planned on 2/21, will need venous doppler to r/o DVT if patient has PFO -a1c, ldl 85 -No antithrombotic prior to admission, now on aspirin 325 mg daily. Continue aspirin, will follow neurology recommendation  Addendum: patient c/o headache on 2/19, ct head no acute interval changes   EGD concerning for gi cancer with high bleeding risk She is found to have acute DVT left lower extremity (Left: Findings consistent with acute deep vein thrombosis involving the left posterior tibial vein, and left peroneal vein. ) Case discussed with hematology/oncology Dr Alen Blew who   Recommend proceed with IVC filter and cancer staging scan Case discussed with IR Dr Barbie Banner who agreed to proceed with IVC filter placement  Dysphagia/Gastric cancer ( new diagnosis) -she does reports ongoing dysphagia for several months, ab pain when she eats in the last few months, she denies n/v, states stool is brown -MBS unremarkable,  -dg esophagus concerning for distal esophageal ulcer vs tumor -EGD on 2/22 showed "Large fungating malignant-appearing ulcerated proximal stomach mass that has areas of blood seen consistent with recent bleeding" pathology pending -[facility] input appreciated,  ct chest/ab/pel imaging, hematology/oncology consulted, will follow recommendation, gi also recommended general surgery consult  Pancytopenia  / multiple myeloma  -h/o bone marrow transplant in 2010 -case discussed with oncology Dr Julien Nordmann who states patient is getting prn treatment for MM to preserve renal function, multiple myeloma panel is stable -hematology/oncology formally consulted on 2/22 due to above issues    H/o breast cancer , s/p left mastectomy in 2008   Chronic kidney disease stage IV: H/o avf placement in 2015 With a baseline creatinine in 3-2.5 Continue outpatient follow up with nephrology    Chronic Hypotension:  Continue home meds  Midrodrine,  hold home meds lasix  COPD: Seems to be stable On anoro andsingulair  GERD: on carafate and zantac   Dry mouth: on pilocarpine  Hypocalcemia: s/p calcium gluconate , ca improved    Initial bcid + proteus and gpc Blood culture no growth, no fever, no leukocytosis D/c vanc,  D/c rocephin, change to keflex for 4days, to cover possible proteus due to h/o port cath and immunosuppressed status  Code Status: DNR  Family Communication: patient   Disposition Plan: not ready to discharge , needs ivc filter  placement, needs oncology input, may need general surgery consult CIR hopefully early next  week   Consultants:  Neurology  Cardiology  Oncology Dr Julien Nordmann, Dr Bob[facility] GI  CIR  IR Dr Barbie Banner  Procedures:  TEE on 2/21  Dg esophagus on 2/20  MBS  on 2/20  EGD on 2/22  Antibiotics:  Vanc/rocephin since admission to 2/21  Keflex from 2/21 to   Objective: BP 129/61 (BP Location: Right Arm)   Pulse 73   Temp (!) 97.5 F (36.4 C) (Oral)   Resp 15   Ht 5' 5" (1.651 m)   Wt 79.3 kg   SpO2 99%   BMI 29.09 kg/m   Intake/Output Summary (Last 24 hours) at 03/08/2018 0919 Last data filed at 03/08/2018 0840 Gross per 24 hour  Intake 200 ml  Output -  Net 200 ml   Filed Weights   03/06/18 0500 03/07/18 0659 03/08/18 0730  Weight: 79.3 kg 79.3 kg 79.3 kg    Exam: Patient is examined daily including today on 03/08/2018, exams remain the same as of yesterday except that has changed    General:  NAD, pale  Cardiovascular: RRR  Respiratory: CTABL  Abdomen: Soft/ND/NT, positive BS  Musculoskeletal: No Edema  Neuro: alert, oriented   Data Reviewed: Basic Metabolic Panel: Recent Labs  Lab 03/04/18 0500 03/05/18 1106 03/06/18 0500 03/07/18 0550 03/08/18 0532  NA 141 138 140 140 140  K 4.1 3.5 4.8 3.3* 3.6  CL 112* 106 106 106 108  CO2 22 20* 22 26 21*  GLUCOSE 107* 119* 100* 101* 95  BUN _0 CREATININE 2.52* 2.44* 2.56* 2.76* 2.47*  CALCIUM 8.6* 8.3* 6.1* 8.5* 8.2*   Liver Function Tests: Recent Labs  Lab 03/03/18 1243 03/04/18 0500  AST 16 15  ALT 9 9  ALKPHOS 83 61  BILITOT 1.1 0.4  PROT 5.6* 4.6*  ALBUMIN 3.5 2.7*   No results for input(s): LIPASE, AMYLASE in the last 168 hours. Recent Labs  Lab 03/03/18 1341  AMMONIA 10   CBC: Recent Labs  Lab 03/03/18 1243 03/04/18 0500 03/05/18 1106 03/06/18 0500 03/07/18 0550 03/08/18 0532  WBC 2.4* 1.7* 2.0* 2.0* 2.3* 2.7*  NEUTROABS 1.1*  --  0.8* 0.8*  --  1.2*  HGB 8.3* 7.5* 7.0* 7.2* 7.4* 7.3*  HCT 26.8* 24.0* 23.0* 22.1* 23.3* 24.1*  MCV 104.7* 102.6*  103.1* 101.4* 102.6* 103.4*  PLT 54* 60* 62* 66* 72* 72*   Cardiac Enzymes:   No results for input(s): CKTOTAL, CKMB, CKMBINDEX, TROPONINI in the last 168 hours. BNP (last 3 results) Recent Labs    03/13/17 1212 01/20/18 1949  BNP 77 102.6*    ProBNP (last 3 results) No results for input(s): PROBNP in the last 8760 hours.  CBG: No results for input(s): GLUCAP in the last 168 hours.  Recent Results (from the past 240 hour(s))  Urine culture     Status: Abnormal   Collection Time: 02/26/18  4:55 PM  Result Value Ref Range Status   Specimen Description   Final    URINE, RANDOM Performed at [facility] [address] [place], [place] [address]    Special Requests   Final    NONE Performed at [facility], [place] [address], [place], [place] [address]    Culture >=100,000 COLONIES/mL ESCHERICHIA COLI (A)  Final   Report Status 03/01/2018 FINAL  Final   Organism ID, Bacteria ESCHERICHIA COLI (A)  Final      Susceptibility   Escherichia coli - MIC*    AMPICILLIN >=32 RESISTANT Resistant     CEFAZOLIN 8 SENSITIVE Sensitive     CEFTRIAXONE <=1 SENSITIVE Sensitive     CIPROFLOXACIN <=0.25 SENSITIVE Sensitive     GENTAMICIN <=1 SENSITIVE Sensitive     IMIPENEM <=0.25 SENSITIVE Sensitive     NITROFURANTOIN <=16 SENSITIVE Sensitive     TRIMETH/SULFA <=20 SENSITIVE Sensitive     AMPICILLIN/SULBACTAM >=32 RESISTANT Resistant     PIP/TAZO <=4 SENSITIVE Sensitive     Extended ESBL NEGATIVE Sensitive     * >=100,000 COLONIES/mL ESCHERICHIA COLI  Culture, blood (Routine x 2)     Status: None (Preliminary result)   Collection Time: 03/03/18 12:27 PM  Result Value Ref Range Status   Specimen Description BLOOD PORTA CATH  Final   Special Requests   Final    BOTTLES DRAWN AEROBIC AND ANAEROBIC Blood Culture adequate volume Performed at [facility] [address] [place], [place] [address]    Culture NO GROWTH 4 DAYS  Final    Report Status PENDING  Incomplete  Culture, blood (Routine x 2)     Status: Abnormal (Preliminary result)   Collection Time: 03/03/18  1:41 PM  Result Value Ref Range Status   Specimen Description   Final    BLOOD RIGHT ANTECUBITAL Performed at [facility], [place] [address], [place], [place] [address]    Special Requests   Final    BOTTLES DRAWN AEROBIC AND ANAEROBIC Blood Culture adequate volume Performed at [facility] [address] [place], [place] [address]    Culture  Setup Time   Final    GRAM POSITIVE COCCI ANAEROBIC BOTTLE ONLY CRITICAL RESULT CALLED TO, READ BACK BY AND VERIFIED WITH: Jene Every PHARMD 3009 03/04/18 A BROWNING Performed at [facility], [place] [address] [place], [place] [address]    Culture (A)  Final    STAPHYLOCOCCUS SPECIES (COAGULASE NEGATIVE) THE SIGNIFICANCE OF ISOLATING THIS ORGANISM FROM A SINGLE SET OF BLOOD CULTURES WHEN MULTIPLE SETS ARE DRAWN IS UNCERTAIN. PLEASE NOTIFY THE MICROBIOLOGY DEPARTMENT WITHIN ONE WEEK IF SPECIATION AND SENSITIVITIES ARE REQUIRED.    Report Status PENDING  Incomplete  Blood Culture ID Panel (Reflexed)     Status: Abnormal   Collection Time: 03/03/18  1:41 PM  Result Value Ref Range Status   Enterococcus species NOT DETECTED NOT DETECTED Final   Listeria monocytogenes NOT DETECTED NOT DETECTED Final   Staphylococcus species DETECTED (A) NOT DETECTED Final    Comment: Methicillin (oxacillin) resistant coagulase negative staphylococcus. Possible blood culture contaminant (unless isolated from more than one blood culture draw or clinical case suggests pathogenicity). No antibiotic treatment is indicated for blood  culture contaminants. CRITICAL RESULT CALLED TO, READ BACK BY AND VERIFIED WITH: Jene Every PHARMD 1840 03/04/18 A BROWNING    Staphylococcus aureus (BCID) NOT DETECTED NOT DETECTED Final   Methicillin resistance DETECTED (A) NOT DETECTED Final    Comment: CRITICAL RESULT CALLED TO,  READ BACK BY AND VERIFIED WITH: Jene Every PHARMD 1840 03/04/18 A BROWNING    Streptococcus species NOT DETECTED NOT DETECTED Final   Streptococcus agalactiae NOT DETECTED NOT DETECTED Final   Streptococcus pneumoniae NOT DETECTED NOT DETECTED Final   Streptococcus pyogenes NOT DETECTED NOT DETECTED Final   Acinetobacter baumannii NOT DETECTED NOT DETECTED Final   Enterobacteriaceae species DETECTED (A) NOT DETECTED Final    Comment: Enterobacteriaceae represent a large  family of gram-negative bacteria, not a single organism. CRITICAL RESULT CALLED TO, READ BACK BY AND VERIFIED WITH: Jene Every PHARMD 1840 03/04/18 A BROWNING    Enterobacter cloacae complex NOT DETECTED NOT DETECTED Final   Escherichia coli NOT DETECTED NOT DETECTED Final   Klebsiella oxytoca NOT DETECTED NOT DETECTED Final   Klebsiella pneumoniae NOT DETECTED NOT DETECTED Final   Proteus species DETECTED (A) NOT DETECTED Final    Comment: CRITICAL RESULT CALLED TO, READ BACK BY AND VERIFIED WITH: Jene Every PHARMD 1840 03/04/18 A BROWNING    Serratia marcescens NOT DETECTED NOT DETECTED Final   Carbapenem resistance NOT DETECTED NOT DETECTED Final   Haemophilus influenzae NOT DETECTED NOT DETECTED Final   Neisseria meningitidis NOT DETECTED NOT DETECTED Final   Pseudomonas aeruginosa NOT DETECTED NOT DETECTED Final   Candida albicans NOT DETECTED NOT DETECTED Final   Candida glabrata NOT DETECTED NOT DETECTED Final   Candida krusei NOT DETECTED NOT DETECTED Final   Candida parapsilosis NOT DETECTED NOT DETECTED Final   Candida tropicalis NOT DETECTED NOT DETECTED Final    Comment: Performed at [facility], [place] [address], [place], [place] [address]     Studies: Vas [place] Lower Extremity Venous (dvt)  Result Date: 03/07/2018  Lower Venous Study Other Indications: Stroke with PFO. Performing Technologist: June Leap RDMS, RVT  Examination Guidelines: A complete evaluation includes B-mode imaging, spectral Doppler, color  Doppler, and power Doppler as needed of all accessible portions of each vessel. Bilateral testing is considered an integral part of a complete examination. Limited examinations for reoccurring indications may be performed as noted.  Right Venous Findings: +---------+---------------+---------+-----------+----------+-------------------+          CompressibilityPhasicitySpontaneityPropertiesSummary             +---------+---------------+---------+-----------+----------+-------------------+ CFV      Full           Yes      Yes                                      +---------+---------------+---------+-----------+----------+-------------------+ SFJ      Full                                                             +---------+---------------+---------+-----------+----------+-------------------+ FV Prox  Full                                                             +---------+---------------+---------+-----------+----------+-------------------+ FV Mid   Full                                                             +---------+---------------+---------+-----------+----------+-------------------+ FV DistalFull                                                             +---------+---------------+---------+-----------+----------+-------------------+  PFV      Full                                                             +---------+---------------+---------+-----------+----------+-------------------+ POP      Full           Yes      Yes                                      +---------+---------------+---------+-----------+----------+-------------------+ PTV      Full                                         not well visualized +---------+---------------+---------+-----------+----------+-------------------+ PERO     Full                                         not well visualized  +---------+---------------+---------+-----------+----------+-------------------+  Left Venous Findings: +---------+---------------+---------+-----------+----------+------------------+          CompressibilityPhasicitySpontaneityPropertiesSummary            +---------+---------------+---------+-----------+----------+------------------+ CFV      Full           Yes      Yes                                     +---------+---------------+---------+-----------+----------+------------------+ SFJ      Full                                                            +---------+---------------+---------+-----------+----------+------------------+ FV Prox  Full                                                            +---------+---------------+---------+-----------+----------+------------------+ FV Mid   Full                                                            +---------+---------------+---------+-----------+----------+------------------+ FV DistalFull                                                            +---------+---------------+---------+-----------+----------+------------------+ PFV      Full                                                            +---------+---------------+---------+-----------+----------+------------------+  POP      Full           Yes      Yes                                     +---------+---------------+---------+-----------+----------+------------------+ PTV      None                                         isolated proximal                                                        calf               +---------+---------------+---------+-----------+----------+------------------+ PERO     None                                         isolated proximal                                                        calf               +---------+---------------+---------+-----------+----------+------------------+     Summary: Right: There is no evidence of deep vein thrombosis in the lower extremity. No cystic structure found in the popliteal fossa. Left: Findings consistent with acute deep vein thrombosis involving the left posterior tibial vein, and left peroneal vein. No cystic structure found in the popliteal fossa.  *See table(s) above for measurements and observations. Electronically signed by Deitra Mayo MD on 03/07/2018 at 7:51:58 PM.    Final     Scheduled Meds: .  stroke: mapping our early stages of recovery book   Does not apply Once  . sodium chloride  250 mL Intravenous Once  . aspirin  325 mg Oral Daily  . atorvastatin  20 mg Oral q1800  . buPROPion  75 mg Oral BID  . cephALEXin  250 mg Oral Q8H  . citalopram  40 mg Oral Daily  . famotidine  20 mg Oral Daily  . folic acid  1 mg Oral Daily  . gabapentin  600 mg Oral QHS  . levothyroxine  150 mcg Oral Q0600  . loratadine  10 mg Oral Daily  . midodrine  10 mg Oral Daily  . montelukast  10 mg Oral QHS  . pilocarpine  5 mg Oral BID  . sodium chloride flush  3 mL Intravenous Once  . sucralfate  1 g Oral TID WC & HS  . vitamin B-12  1,000 mcg Oral Daily    Continuous Infusions:    Time spent: 52mns, case discussed with [facility] Dr sMichail Sermon hematology/oncology Dr sEldridge Scot IR Dr HBarbie BannerI have personally reviewed and interpreted on  03/08/2018 daily labs, tele strips, imagings as discussed above under date review session and assessment and plans.  I reviewed all nursing notes, pharmacy notes, consultant notes,  vitals, pertinent old records  I have discussed plan of care as described above with RN , patient and daughter at bedside on 03/08/2018   Florencia Reasons MD, PhD  [facility] Pager [phone]. If 7PM-7AM, please contact night-coverage at [email or web address], password [facility] 03/08/2018, 9:19 AM  LOS: 5 days

## 2018-03-08 NOTE — Progress Notes (Signed)
PT Cancellation Note  Patient Details Name: Nancy Norris MRN: [id number] DOB: Dec 16, 1940   Cancelled Treatment:    Reason Eval/Treat Not Completed: Patient at procedure or test/unavailable (EGD). PT will continue to follow acutely as available.    [place] 03/08/2018, 8:13 AM

## 2018-03-08 NOTE — Consult Note (Signed)
Reason for Nancy request:    Deep vein thrombosis  HPI: I was asked by Dr. Erlinda Hong  to evaluate Nancy Norris for Nancy evaluation and treatment of deep vein thrombosis in Nancy setting of acute embolic stroke, gastric tumor and history of multiple myeloma.  She is a 78 year old woman currently receiving treatment for multiple myeloma under Nancy care of Dr. Julien Nordmann and currently receiving  Carfilzomib, Cytoxan and dexamethasone.  She was hospitalized on March 03, 2018 with altered mental status and found to have bilateral acute ischemia consistent with embolic stroke.  Her work-up did reveal acute DVT of Nancy left lower extremity and Nancy left posterior tibial vein.  She also was found to have abnormal barium swallow due to complaints of dysphasia in Nancy last year.  Barium swallow showed irregularity in Nancy distal esophagus and a questionable ulceration.  She underwent EGD today by Dr. Michail Sermon which showed a large fungating ulcerated proximal stomach mass with areas of blood seen consistent with recent bleeding.  Biopsies were taken and currently pending.  Staging CT scan is currently pending at this time.  She has not received any anticoagulation and currently on aspirin only.  Anticoagulation has been held due to questionable GI bleeding was asked to comment about treatment of her deep vein thrombosis in Nancy setting of a large tumor in Nancy stomach.  Clinically, she does report some dysphasia and weight loss but overall feels reasonably well at this time.  She denies any headaches or change in her mentation.  She does not report any blurry vision, syncope or seizures. Does not report any fevers, chills or sweats.  Does not report any cough, wheezing or hemoptysis.  Does not report any chest pain, palpitation, orthopnea or leg edema.  Does not report any nausea, vomiting or abdominal pain.  Does not report any constipation or diarrhea.  Does not report any skeletal complaints.    Does not report frequency, urgency or  hematuria.  Does not report any skin rashes or lesions. Does not report any heat or cold intolerance.  Does not report any lymphadenopathy or petechiae.  Does not report any anxiety or depression.  Remaining review of systems is negative.    Past Medical History:  Diagnosis Date  . Anemia   . Anginal pain ([place])    used NTG x 2 May 31 and 06/15/13   . Anxiety   . Arthritis   . B12 deficiency 12/04/2014  . Breast cancer ([place])   . Complication of anesthesia   . COPD (chronic obstructive pulmonary disease) ([place])   . Cryptococcal pneumonitis ([place]) 11/22/2015  . Depression   . Dizziness   . Dyspnea   . Fibromyalgia   . Fibromyalgia   . GERD (gastroesophageal reflux disease)   . Headache(784.0)   . Heart murmur   . Hemoptysis 10/21/2015  . History of blood transfusion    last one May 12   . Hx of cardiovascular stress test    LexiScan with low level exercise Myoview (02/2013): No ischemia, EF 72%; normal study  . Hx of echocardiogram    a.  Echocardiogram (12/26/2012): EF 40-98%, grade 1 diastolic dysfunction;   b.  Echocardiogram (02/2013): EF 55-60%, no WMA, trivial effusion  . Hyperkalemia   . Hyponatremia   . Hypotension   . Hypothyroidism   . Mucositis   . Multiple myeloma   . Myocardial infarction [facility]    in past, Norris was unaware.   . Neuropathy   . Nodule of left  lung 09/13/2015  . Pain in joint, pelvic region and thigh 07/07/2015  . Pneumonia    several  . PONV (postoperative nausea and vomiting) 2008   after mastestomy  :  Past Surgical History:  Procedure Laterality Date  . ABDOMINAL HYSTERECTOMY  1981  . AV FISTULA PLACEMENT Left 06/19/2013   Procedure: CREATION OF LEFT ARM ARTERIOVENOUS (AV) FISTULA ;  Surgeon: Angelia Mould, MD;  Location: [place];  Service: Vascular;  Laterality: Left;  . BREAST RECONSTRUCTION    . BREAST SURGERY Right    reduction  . CATARACT EXTRACTION, BILATERAL    . CHOLECYSTECTOMY  1971  . COLONOSCOPY    . EYE SURGERY Bilateral     lens implant  . history of Port removal    . LUNG BIOPSY  10/21/2015  . MASTECTOMY Left 2008  . PORTACATH PLACEMENT  12/2012   has had 2  . RESECTION OF ARTERIOVENOUS FISTULA ANEURYSM Left 06/04/2017   Procedure: LIGATION ANEURYSM OF LEFT ARTERIOVENOUS FISTULA;  Surgeon: Angelia Mould, MD;  Location: [place];  Service: Vascular;  Laterality: Left;  . Status post stem cell transplant on September 28, 2008.    . TEE WITHOUT CARDIOVERSION N/A 03/07/2018   Procedure: TRANSESOPHAGEAL ECHOCARDIOGRAM (TEE);  Surgeon: Buford Dresser, MD;  Location: [facility] ENDOSCOPY;  Service: Cardiovascular;  Laterality: N/A;  :   Current Facility-Administered Medications:  .   stroke: mapping our early stages of recovery book, , Does not apply, Once, Buford Dresser, MD .  0.9 %  sodium chloride infusion (Manually program via Guardrails IV Fluids), 250 mL, Intravenous, Once, Buford Dresser, MD, Last Rate: 10 mL/hr at 03/05/18 2227 .  acetaminophen (TYLENOL) tablet 650 mg, 650 mg, Oral, Q6H PRN, 650 mg at 03/06/18 2231 **OR** acetaminophen (TYLENOL) suppository 650 mg, 650 mg, Rectal, Q6H PRN, Buford Dresser, MD .  albuterol (PROVENTIL) (2.5 MG/3ML) 0.083% nebulizer solution 3 mL, 3 mL, Inhalation, Q6H PRN, Buford Dresser, MD .  aspirin tablet 325 mg, 325 mg, Oral, Daily, Buford Dresser, MD, 325 mg at 03/08/18 1034 .  atorvastatin (LIPITOR) tablet 20 mg, 20 mg, Oral, q1800, Buford Dresser, MD, 20 mg at 03/07/18 1703 .  buPROPion [facility]) tablet 75 mg, 75 mg, Oral, BID, Buford Dresser, MD, 75 mg at 03/08/18 1034 .  cephALEXin (KEFLEX) capsule 250 mg, 250 mg, Oral, Q8H, Florencia Reasons, MD, 250 mg at 03/07/18 2204 .  citalopram (CELEXA) tablet 40 mg, 40 mg, Oral, Daily, Buford Dresser, MD, 40 mg at 03/08/18 1035 .  cromolyn (OPTICROM) 4 % ophthalmic solution 1-2 drop, 1-2 drop, Both Eyes, QID PRN, Buford Dresser, MD .  famotidine (PEPCID)  tablet 20 mg, 20 mg, Oral, Daily, Buford Dresser, MD, 20 mg at 03/08/18 1035 .  folic acid (FOLVITE) tablet 1 mg, 1 mg, Oral, Daily, Buford Dresser, MD, 1 mg at 03/08/18 1034 .  gabapentin (NEURONTIN) capsule 300 mg, 300 mg, Oral, Daily PRN, Buford Dresser, MD .  gabapentin (NEURONTIN) capsule 600 mg, 600 mg, Oral, QHS, Buford Dresser, MD, 600 mg at 03/07/18 2204 .  levothyroxine (SYNTHROID, LEVOTHROID) tablet 150 mcg, 150 mcg, Oral, Q0600, Buford Dresser, MD, 150 mcg at 03/07/18 0531 .  loratadine (CLARITIN) tablet 10 mg, 10 mg, Oral, Daily, Buford Dresser, MD, 10 mg at 03/08/18 1035 .  midodrine (PROAMATINE) tablet 10 mg, 10 mg, Oral, Daily, Buford Dresser, MD, 10 mg at 03/08/18 1034 .  montelukast (SINGULAIR) tablet 10 mg, 10 mg, Oral, QHS, Buford Dresser, MD, 10 mg at 03/07/18 2204 .  pilocarpine (SALAGEN) tablet 5 mg, 5 mg, Oral, BID, Buford Dresser, MD, 5 mg at 03/08/18 1034 .  sodium chloride flush (NS) 0.9 % injection 3 mL, 3 mL, Intravenous, Once, Buford Dresser, MD .  sucralfate (CARAFATE) tablet 1 g, 1 g, Oral, TID WC & HS, Buford Dresser, MD, 1 g at 03/08/18 1127 .  traMADol (ULTRAM) tablet 50 mg, 50 mg, Oral, Q6H PRN, Buford Dresser, MD, 50 mg at 03/08/18 1128 .  vitamin B-12 (CYANOCOBALAMIN) tablet 1,000 mcg, 1,000 mcg, Oral, Daily, Buford Dresser, MD, 1,000 mcg at 03/08/18 1035:  Allergies  Allergen Reactions  . Codeine Anaphylaxis    anaphylasix tolarates oxycodone per care cast  . Latex Shortness Of Breath  . Chocolate     migraines  . Onion Other (See Comments)    Causes migraine headaches  . Zyprexa [Olanzapine] Other (See Comments)    Confusion , dizzy,unsteady  . Adhesive [Tape] Other (See Comments)    blisters  . Heparin Rash    Can flush port with heparin  . Hydrocodone Rash    With extreme itching  . Iodinated Diagnostic Agents Hives, Itching, Rash and Other  (See Comments)    Happened 60 years ago Stage IV kidney function  . Oxycodone Rash  . Promethazine-Dm Hives  . Sulfa Antibiotics Itching  :  Family History  Problem Relation Age of Onset  . Arthritis Mother   . Asthma Mother   . Cancer Sister   . Hyperlipidemia Brother   :  Social History   Socioeconomic History  . Marital status: Single    Spouse name: Not on file  . Number of children: 1  . Years of education: Not on file  . Highest education level: Not on file  Occupational History  . Occupation: retired-sect.Licensed conveyancer)  Social Needs  . Financial resource strain: Not on file  . Food insecurity:    Worry: Not on file    Inability: Not on file  . Transportation needs:    Medical: Yes    Non-medical: No  Tobacco Use  . Smoking status: Former Smoker    Packs/day: 1.00    Years: 30.00    Pack years: 30.00    Types: Cigarettes    Last attempt to quit: 02/15/2005    Years since quitting: 13.0  . Smokeless tobacco: Never Used  Substance and Sexual Activity  . Alcohol use: No  . Drug use: No  . Sexual activity: Never  Lifestyle  . Physical activity:    Days per week: Not on file    Minutes per session: Not on file  . Stress: Not on file  Relationships  . Social connections:    Talks on phone: Not on file    Gets together: Not on file    Attends religious service: Not on file    Active member of club or organization: Not on file    Attends meetings of clubs or organizations: Not on file    Relationship status: Not on file  . Intimate partner violence:    Fear of current or ex partner: Not on file    Emotionally abused: Not on file    Physically abused: Not on file    Forced sexual activity: Not on file  Other Topics Concern  . Not on file  Social History Narrative  . Not on file  :  Pertinent items are noted in HPI.  Exam: Blood pressure 129/61, pulse 73, temperature (!) 97.5 F (36.4 C), temperature source Oral,  resp. rate 15, height '5\' 5"'$  (1.651  m), weight 174 lb 13.2 oz (79.3 kg), SpO2 99 %. General appearance: alert and cooperative appeared without distress. Head: atraumatic without any abnormalities. Eyes: conjunctivae/corneas clear. PERRL.  Sclera anicteric. Throat: lips, mucosa, and tongue normal; without oral thrush or ulcers. Resp: clear to auscultation bilaterally without rhonchi, wheezes or dullness to percussion. Cardio: regular rate and rhythm, S1, S2 normal, no murmur, click, rub or gallop GI: soft, non-tender; bowel sounds normal; no masses,  no organomegaly Skin: Skin color, texture, turgor normal. No rashes or lesions Lymph nodes: Cervical, supraclavicular, and axillary nodes normal. Neurologic: Grossly normal without any motor, sensory or deep tendon reflexes. Musculoskeletal: No joint deformity or effusion.  Recent Labs    03/07/18 0550 03/08/18 0532  WBC 2.3* 2.7*  HGB 7.4* 7.3*  HCT 23.3* 24.1*  PLT 72* 72*   Recent Labs    03/07/18 0550 03/08/18 0532  NA 140 140  K 3.3* 3.6  CL 106 108  CO2 26 21*  GLUCOSE 101* 95  BUN 16 14  CREATININE 2.76* 2.47*  CALCIUM 8.5* 8.2*      Mr Brain Wo Contrast  Result Date: 03/03/2018 CLINICAL DATA:  Sudden onset confusion. EXAM: MRI HEAD WITHOUT CONTRAST TECHNIQUE: Multiplanar, multiecho pulse sequences of Nancy brain and surrounding structures were obtained without intravenous contrast. COMPARISON:  Head CT 03/03/2018 FINDINGS: Nancy examination had to be discontinued prior to completion due to altered mental status and inability to follow Nancy technologist's instructions. Coronal and axial diffusion-weighted imaging, sagittal T1-weighted imaging and axial T2-weighted imaging were obtained. There is multifocal abnormal diffusion restriction within both hemispheres, including Nancy left frontal lobe, both parietal lobes, left occipital lobe and both cerebellar hemispheres. Nancy largest area of diffusion abnormalities in Nancy posterior left parietal lobe. There is no midline  shift or other mass effect. Nancy midline structures are normal. There is moderate white matter hyperintense T2-weighted signal consistent with chronic microvascular ischemia. IMPRESSION: 1. Examination discontinued early due to Norris altered mental status. 2. Multifocal bilateral acute ischemia within multiple vascular territories in both hemispheres. This may indicate a central cardiac or aortic embolic source. Electronically Signed   By: Ulyses Jarred M.D.   On: 03/03/2018 15:04    Result Date: 03/06/2018 CLINICAL DATA:  Dysphagia EXAM: ESOPHOGRAM/BARIUM SWALLOW TECHNIQUE: Single contrast examination was performed using thick and thin barium FLUOROSCOPY TIME:  Fluoroscopy Time:  1 minutes, 48 seconds Radiation Exposure Index (if provided by Nancy fluoroscopic device): 17.9 mGy Number of Acquired Spot Images: None COMPARISON:  CT chest from 01/20/2018 FINDINGS: Nancy Norris was relatively frail, and I elected not to give gas crystals. With Norris laying down, swallows were performed in Nancy LPO position. Mild distal esophageal fold thickening. No obvious ulceration. Mildly dilated esophagus. Primary peristaltic waves in Nancy esophagus were disrupted on 4/4 swallows in Nancy mid esophagus level. Initial swallows demonstrate some lobularity and irregularity along Nancy upper margin of Nancy gastroesophageal junction for example on image 35/1. Tumor or ulceration not readily excluded given this appearance. In Nancy LPO position, there is poor filling of Nancy gastroesophageal junction, with only slow percolated shin of contrast from Nancy dilated esophagus into Nancy stomach. A 13 mm barium tablet passed without difficulty into Nancy stomach. Limited assessment of Nancy upper stomach demonstrates no appreciable gastric diverticulum along Nancy right margin of Nancy upper stomach, raising concern that Nancy rounded density in this vicinity on Nancy recent chest CT from 01/20/2018 measuring 3.6 by 2.6 cm on that  exam probably represents  gastrohepatic ligament pathologic adenopathy rather than a diverticulum. IMPRESSION: 1. Nonspecific esophageal dysmotility disorder with disruption of primary peristaltic waves in Nancy mid esophagus. 2. Distal esophageal irregularity potentially with ulceration. Difficult to exclude tumor of Nancy distal esophagus. Endoscopy is recommended. 3. There is no appreciable upper gastric diverticulum, and based on this I suspect that Nancy density along Nancy right wall of Nancy proximal stomach on recent chest CT of 01/20/2018 is actually due to new pathologic adenopathy/tumor in Nancy gastrohepatic ligament, rather than a diverticulum. Electronically Signed   By: Van Clines M.D.   On: 03/06/2018 09:31    Assessment and Plan:    78 year old woman with Nancy following:  1.  Embolic stroke with MRI showed multifocal ischemia noted after presenting with acute mental status changes on March 03, 2018.  Anticoagulation has been held and currently on aspirin only. Work-up revealed deep vein thrombosis with small patent foraminal valve evident on TEE.  Management as per neurology at this time.  But there is likelihood of paradoxical emboli arising from her deep vein thrombosis and contributing to her stroke embolic stroke.  2.  Gastric mass: Suspicious for malignancy and biopsy is pending.  I agree with obtaining CT scan of Nancy abdomen and pelvis to complete Nancy staging work-up.  Management of this malignancy was discussed today with Nancy Norris and her daughter.  Depending on Nancy final pathology and staging I doubt she will be a surgical candidate given her underlying multiple myeloma, chronically insufficiency and recent stroke.  It is very possible she will require palliative radiation therapy to control her tumor in preparation for future anticoagulation.  Further recommendation pending Nancy results of her pathology as well as staging CT scan.  But likely palliative radiation therapy would be her best  option.  3.  Deep vein thrombosis: Risks and benefits of full dose anticoagulation was reviewed today.  She has no active GI bleeding but would be at risk given her large tumor.  I have recommended proceeding with IVC filter and consider full dose anticoagulation once her gastric tumor is treated possibly with radiation.  4.  Multiple myeloma: Active treatment at this time with a regimen outlined above.  This treatment will be on hold till other issues have been addressed.  Dr. Julien Nordmann will evaluate Nancy Norris on Monday.  Please call with any questions in Nancy interim.  80  minutes was spent with Nancy Norris face-to-face today.  More than 50% of time was spent on reviewing imaging studies, laboratory data, discussing these findings with Nancy Norris and her daughter.

## 2018-03-09 ENCOUNTER — Inpatient Hospital Stay (HOSPITAL_COMMUNITY): Payer: Medicare Other

## 2018-03-09 ENCOUNTER — Encounter (HOSPITAL_COMMUNITY): Payer: Self-pay | Admitting: Interventional Radiology

## 2018-03-09 DIAGNOSIS — I6389 Other cerebral infarction: Secondary | ICD-10-CM

## 2018-03-09 HISTORY — PX: IR IVC FILTER PLMT / S&I /IMG GUID/MOD SED: IMG701

## 2018-03-09 LAB — BASIC METABOLIC PANEL
Anion gap: 9 (ref 5–15)
BUN: 13 mg/dL (ref 8–23)
CHLORIDE: 105 mmol/L (ref 98–111)
CO2: 23 mmol/L (ref 22–32)
Calcium: 8.3 mg/dL — ABNORMAL LOW (ref 8.9–10.3)
Creatinine, Ser: 2.34 mg/dL — ABNORMAL HIGH (ref 0.44–1.00)
GFR calc Af Amer: 23 mL/min — ABNORMAL LOW (ref >60)
GFR calc non Af Amer: 20 mL/min — ABNORMAL LOW (ref >60)
Glucose, Bld: 102 mg/dL — ABNORMAL HIGH (ref 70–99)
Potassium: 3.6 mmol/L (ref 3.5–5.1)
Sodium: 137 mmol/L (ref 135–145)

## 2018-03-09 LAB — CBC WITH DIFFERENTIAL/PLATELET
Abs Immature Granulocytes: 0.03 10*3/uL (ref 0.00–0.07)
Basophils Absolute: 0 10*3/uL (ref 0.0–0.1)
Basophils Relative: 1 %
Eosinophils Absolute: 0.1 10*3/uL (ref 0.0–0.5)
Eosinophils Relative: 4 %
HEMATOCRIT: 23.5 % — AB (ref 36.0–46.0)
HEMOGLOBIN: 7.2 g/dL — AB (ref 12.0–15.0)
Immature Granulocytes: 1 %
Lymphocytes Relative: 25 %
Lymphs Abs: 0.8 10*3/uL (ref 0.7–4.0)
MCH: 31.6 pg (ref 26.0–34.0)
MCHC: 30.6 g/dL (ref 30.0–36.0)
MCV: 103.1 fL — ABNORMAL HIGH (ref 80.0–100.0)
Monocytes Absolute: 0.7 10*3/uL (ref 0.1–1.0)
Monocytes Relative: 21 %
Neutro Abs: 1.6 10*3/uL — ABNORMAL LOW (ref 1.7–7.7)
Neutrophils Relative %: 48 %
Platelets: 67 10*3/uL — ABNORMAL LOW (ref 150–400)
RBC: 2.28 MIL/uL — ABNORMAL LOW (ref 3.87–5.11)
RDW: 23.8 % — ABNORMAL HIGH (ref 11.5–15.5)
WBC: 3.3 10*3/uL — ABNORMAL LOW (ref 4.0–10.5)
nRBC: 0 % (ref 0.0–0.2)

## 2018-03-09 LAB — CULTURE, BLOOD (ROUTINE X 2): Special Requests: ADEQUATE

## 2018-03-09 LAB — PROTIME-INR
INR: 1.01
Prothrombin Time: 13.2 seconds (ref 11.4–15.2)

## 2018-03-09 MED ORDER — MIDAZOLAM HCL 2 MG/2ML IJ SOLN
INTRAMUSCULAR | Status: AC
  Filled 2018-03-09: qty 2

## 2018-03-09 MED ORDER — FENTANYL CITRATE (PF) 100 MCG/2ML IJ SOLN
INTRAMUSCULAR | Status: AC
  Filled 2018-03-09: qty 2

## 2018-03-09 MED ORDER — MIDAZOLAM HCL 2 MG/2ML IJ SOLN
INTRAMUSCULAR | Status: AC | PRN
  Administered 2018-03-09: 0.5 mg via INTRAVENOUS

## 2018-03-09 MED ORDER — FENTANYL CITRATE (PF) 100 MCG/2ML IJ SOLN
INTRAMUSCULAR | Status: AC | PRN
  Administered 2018-03-09: 50 ug via INTRAVENOUS
  Administered 2018-03-09: 25 ug via INTRAVENOUS

## 2018-03-09 MED ORDER — LIDOCAINE HCL 1 % IJ SOLN
INTRAMUSCULAR | Status: AC
  Filled 2018-03-09: qty 20

## 2018-03-09 MED ORDER — POTASSIUM CHLORIDE CRYS ER 20 MEQ PO TBCR
40.0000 meq | EXTENDED_RELEASE_TABLET | Freq: Once | ORAL | Status: AC
  Administered 2018-03-09: 40 meq via ORAL
  Filled 2018-03-09: qty 2

## 2018-03-09 NOTE — Consult Note (Signed)
Chief Complaint: Patient was seen in consultation today for retrievable inferior vena cava filter placement Chief Complaint  Patient presents with  . Altered Mental Status   at the request of Dr Dillard Cannon  Supervising Physician: Marybelle Killings  Patient Status: [name] - In-pt  History of Present Illness: Nancy Norris is a 78 y.o. female   Acute embolic CVA 1 week ago Hx Multiple myeloma-- ongoing tx-- Dr Julien Nordmann New finding fungating gastric tumor  Admitted with AMS- 3/84/66--- embolic CVA PFO Interatrial Septum: Evidence of atrial level shunting detected by color flow Doppler. A small patent foramen ovale is detected with predominantly left to right shunting across the atrial septum  EGD: A large, fungating, infiltrative and ulcerated, partially circumferential (involving one-third of the lumen circumference) mass with oozing bleeding and stigmata of recent bleeding was found in the gastric fundus. Biopsies were taken with a cold forceps for histology. Estimated blood loss was minimal.  LE doppler: Right: There is no evidence of deep vein thrombosis in the lower extremity. No cystic structure found in the popliteal fossa. Left: Findings consistent with acute deep vein thrombosis involving the left posterior tibial vein, and left peroneal vein. No cystic structure found in the popliteal fossa.  With new CVA; PFO; gastric mass and LLE DVT Cannot anticoagulate-- high risk for bleed  Request for Inferior vena cava filter placement Dr Barbie Banner has reviewed imaging and chart Discussed with Dr Erlinda Hong Dr Barbie Banner has approved procedure CO2 contrast secondary renal disease -- Cr 2.34     Past Medical History:  Diagnosis Date  . Anemia   . Anginal pain ([place])    used NTG x 2 May 31 and 06/15/13   . Anxiety   . Arthritis   . B12 deficiency 12/04/2014  . Breast cancer ([place])   . Complication of anesthesia   . COPD (chronic obstructive pulmonary disease) ([place])   . Cryptococcal pneumonitis  ([place]) 11/22/2015  . Depression   . Dizziness   . Dyspnea   . Fibromyalgia   . Fibromyalgia   . GERD (gastroesophageal reflux disease)   . Headache(784.0)   . Heart murmur   . Hemoptysis 10/21/2015  . History of blood transfusion    last one May 12   . Hx of cardiovascular stress test    LexiScan with low level exercise Myoview (02/2013): No ischemia, EF 72%; normal study  . Hx of echocardiogram    a.  Echocardiogram (12/26/2012): EF 59-93%, grade 1 diastolic dysfunction;   b.  Echocardiogram (02/2013): EF 55-60%, no WMA, trivial effusion  . Hyperkalemia   . Hyponatremia   . Hypotension   . Hypothyroidism   . Mucositis   . Multiple myeloma   . Myocardial infarction [facility])    in past, patient was unaware.   . Neuropathy   . Nodule of left lung 09/13/2015  . Pain in joint, pelvic region and thigh 07/07/2015  . Pneumonia    several  . PONV (postoperative nausea and vomiting) 2008   after mastestomy    Past Surgical History:  Procedure Laterality Date  . ABDOMINAL HYSTERECTOMY  1981  . AV FISTULA PLACEMENT Left 06/19/2013   Procedure: CREATION OF LEFT ARM ARTERIOVENOUS (AV) FISTULA ;  Surgeon: Angelia Mould, MD;  Location: [place];  Service: Vascular;  Laterality: Left;  . BREAST RECONSTRUCTION    . BREAST SURGERY Right    reduction  . CATARACT EXTRACTION, BILATERAL    . CHOLECYSTECTOMY  1971  . COLONOSCOPY    .  EYE SURGERY Bilateral    lens implant  . history of Port removal    . LUNG BIOPSY  10/21/2015  . MASTECTOMY Left 2008  . PORTACATH PLACEMENT  12/2012   has had 2  . RESECTION OF ARTERIOVENOUS FISTULA ANEURYSM Left 06/04/2017   Procedure: LIGATION ANEURYSM OF LEFT ARTERIOVENOUS FISTULA;  Surgeon: Angelia Mould, MD;  Location: [place];  Service: Vascular;  Laterality: Left;  . Status post stem cell transplant on September 28, 2008.    . TEE WITHOUT CARDIOVERSION N/A 03/07/2018   Procedure: TRANSESOPHAGEAL ECHOCARDIOGRAM (TEE);  Surgeon: Buford Dresser, MD;  Location: [facility] ENDOSCOPY;  Service: Cardiovascular;  Laterality: N/A;    Allergies: Codeine; Latex; Chocolate; Onion; Zyprexa [olanzapine]; Adhesive [tape]; Heparin; Hydrocodone; Iodinated diagnostic agents; Oxycodone; Promethazine-dm; and Sulfa antibiotics  Medications: Prior to Admission medications   Medication Sig Start Date End Date Taking? Authorizing Provider  acetaminophen (TYLENOL) 500 MG tablet Take 1,000 mg by mouth daily as needed for moderate pain or headache.   Yes [provider]  albuterol (PROVENTIL HFA;VENTOLIN HFA) 108 (90 Base) MCG/ACT inhaler Inhale 1-2 puffs into the lungs every 6 (six) hours as needed for wheezing or shortness of breath (cough). 01/10/18  Yes McClanahan, Danton Sewer, NP  azelastine (ASTELIN) 0.1 % nasal spray Place 1 spray into both nostrils daily. Use in each nostril as directed 01/10/18  Yes McClanahan, Kyra, NP  buPROPion (WELLBUTRIN) 75 MG tablet Take 1 tablet (75 mg total) by mouth 2 (two) times daily. 05/29/17 05/29/18 Yes Liane Comber, NP  cephALEXin (KEFLEX) 500 MG capsule Take 1 capsule (500 mg total) by mouth 3 (three) times daily. 02/26/18  Yes Drenda Freeze, MD  citalopram (CELEXA) 40 MG tablet Take 1 tablet daily for Mood 10/18/17  Yes [name], MD  cromolyn (OPTICROM) 4 % ophthalmic solution INSTILL 1 TO 2 DROPS IN AFFECTED EYE(S) 4 TIMES A DAY FOR ALLERGIES. Patient taking differently: Place 1-2 drops into both eyes 4 (four) times daily as needed (allergies).  12/27/17  Yes Liane Comber, NP  dexamethasone (DECADRON) 4 MG tablet Take 4 mg by mouth. 5 tabs on day 1 of chemo 12/18/17  Yes [provider]  folic acid (FOLVITE) 1 MG tablet Take 1 tablet (1 mg total) by mouth daily. 03/19/17 03/19/18 Yes Buriev, Arie Sabina, MD  furosemide (LASIX) 40 MG tablet TAKE 1 TABLET BY MOUTH DAILY. Patient taking differently: Take 40 mg by mouth daily as needed for fluid or edema.  12/03/16  Yes [name], MD    gabapentin (NEURONTIN) 300 MG capsule TAKE 1 CAPSULE BY MOUTH 3 TIMES DAILY. Patient taking differently: Take 300-600 mg by mouth See admin instructions. 600 mg at bedtime and an additional 300 mg during the day if "cramping" 02/17/18  Yes [name], MD  ipratropium-albuterol (DUONEB) 0.5-2.5 (3) MG/3ML SOLN Take 3 mLs by nebulization every 4 (four) hours as needed. Max:6 doses per day 01/10/18  Yes McClanahan, Kyra, NP  levocetirizine (XYZAL) 5 MG tablet Take 1 tablet (5 mg total) by mouth every evening. 01/10/18 01/11/19 Yes McClanahan, Kyra, NP  levothyroxine (SYNTHROID, LEVOTHROID) 150 MCG tablet TAKE 1 TABLET BY MOUTH DAILY. NEED OFFICE VISIT FOR FURTHER REFILLS. Patient taking differently: Take by mouth daily before breakfast. TAKE 1 TABLET BY MOUTH DAILY. NEED OFFICE VISIT FOR FURTHER REFILLS. 10/03/17  Yes [name], MD  loperamide (IMODIUM) 2 MG capsule Take 1 capsule (2 mg total) by mouth as needed for diarrhea or loose stools. 02/28/16  Yes [name],  MD  magic mouthwash SOLN Take 5 mLs by mouth 4 (four) times daily as needed for mouth pain. 10/27/15  Yes Curt Bears, MD  midodrine (PROAMATINE) 10 MG tablet Take 1 tablet daily to support BP Patient taking differently: Take 10 mg by mouth daily.  12/03/17  Yes [name], MD  montelukast (SINGULAIR) 10 MG tablet Take 1 tablet daily for Allergies 10/18/17  Yes [name], MD  nystatin (MYCOSTATIN/NYSTOP) powder Apply topically 2 (two) times daily. 01/10/18  Yes McClanahan, Kyra, NP  ondansetron (ZOFRAN ODT) 8 MG disintegrating tablet Take 1 tablet (8 mg total) by mouth every 8 (eight) hours as needed for nausea or vomiting. 12/17/17  Yes Curt Bears, MD  pilocarpine (SALAGEN) 5 MG tablet TAKE 1 TABLET BY MOUTH 3 TIMES DAILY FOR DRY MOUTH. Patient taking differently: Take 5 mg by mouth 2 (two) times daily.  09/26/16  Yes [name], MD  ranitidine (ZANTAC) 300 MG tablet Take 1 tablet 2 x /day at [facility]  & Bedtime for Heart burn & Reflux Patient taking differently: Take 300 mg by mouth 2 (two) times daily.  09/16/17  Yes [name], MD  sucralfate (CARAFATE) 1 g tablet Take 1 tablet dissolved in water  4 x /day BEFORE Meals & Bedtime for Acid indigestion & Reflux Patient taking differently: Take 1 g by mouth 4 (four) times daily -  with meals and at bedtime. Take 1 tablet dissolved in water  4 x /day BEFORE Meals & Bedtime for Acid indigestion & Reflux 10/04/17  Yes [name], MD  traMADol (ULTRAM) 50 MG tablet Take 1 to 2 tablets by mouth 4 times a day as needed for pain. Patient taking differently: Take 50-100 mg by mouth 4 (four) times daily as needed for moderate pain. Take 1 to 2 tablets by mouth 4 times a day as needed for pain. 07/22/15  Yes [name], MD  vitamin B-12 (CYANOCOBALAMIN) 1000 MCG tablet Take 1 tablet (1,000 mcg total) by mouth daily. Patient taking differently: Take 2,000 mcg by mouth daily.  03/19/17  Yes Kinnie Feil, MD  fluconazole (DIFLUCAN) 150 MG tablet Take one tablet at onset of symptoms and second tablet three days later for yeast. Patient not taking: Reported on 03/03/2018 01/10/18   Garnet Sierras, NP  nitroGLYCERIN (NITROSTAT) 0.4 MG SL tablet Place 0.4 mg under the tongue every 5 (five) minutes as needed for chest pain.     [provider]  umeclidinium-vilanterol (ANORO ELLIPTA) 62.5-25 MCG/INH AEPB Inhale 1 puff into the lungs daily as needed (shortness of breath). Patient not taking: Reported on 03/03/2018 01/10/18   Garnet Sierras, NP     Family History  Problem Relation Age of Onset  . Arthritis Mother   . Asthma Mother   . Cancer Sister   . Hyperlipidemia Brother     Social History   Socioeconomic History  . Marital status: Single    Spouse name: Not on file  . Number of children: 1  . Years of education: Not on file  . Highest education level: Not on file  Occupational History  . Occupation: retired-sect.[employer])  Social Needs  . Financial resource strain: Not on file  . Food insecurity:    Worry: Not on file    Inability: Not on file  . Transportation needs:    Medical: Yes    Non-medical: No  Tobacco Use  . Smoking status: Former Smoker    Packs/day: 1.00    Years: 30.00  Pack years: 30.00    Types: Cigarettes    Last attempt to quit: 02/15/2005    Years since quitting: 13.0  . Smokeless tobacco: Never Used  Substance and Sexual Activity  . Alcohol use: No  . Drug use: No  . Sexual activity: Never  Lifestyle  . Physical activity:    Days per week: Not on file    Minutes per session: Not on file  . Stress: Not on file  Relationships  . Social connections:    Talks on phone: Not on file    Gets together: Not on file    Attends religious service: Not on file    Active member of club or organization: Not on file    Attends meetings of clubs or organizations: Not on file    Relationship status: Not on file  Other Topics Concern  . Not on file  Social History Narrative  . Not on file    Review of Systems: A 12 point ROS discussed and pertinent positives are indicated in the HPI above.  All other systems are negative.  Review of Systems  Constitutional: Positive for activity change and fatigue. Negative for fever.  Respiratory: Negative for cough and shortness of breath.   Gastrointestinal: Negative for abdominal pain.  Neurological: Positive for weakness.  Psychiatric/Behavioral: Negative for behavioral problems and confusion.    Vital Signs: BP 120/75 (BP Location: Right Arm)   Pulse 75   Temp 98.2 F (36.8 C) (Oral)   Resp 15   Ht '5\' 5"'$  (1.651 m)   Wt 173 lb 11.6 oz (78.8 kg)   SpO2 99%   BMI 28.91 kg/m   Physical Exam Vitals signs reviewed.  Cardiovascular:     Rate and Rhythm: Normal rate and regular rhythm.     Heart sounds: Normal heart sounds.  Pulmonary:     Effort: Pulmonary effort is normal.     Breath sounds: Normal breath sounds.    Abdominal:     General: Bowel sounds are normal.     Palpations: Abdomen is soft.  Musculoskeletal: Normal range of motion.  Skin:    General: Skin is warm and dry.  Neurological:     Mental Status: She is alert and oriented to person, place, and time.  Psychiatric:        Mood and Affect: Mood normal.        Behavior: Behavior normal.        Thought Content: Thought content normal.        Judgment: Judgment normal.     Imaging: Ct Abdomen Pelvis Wo Contrast  Result Date: 03/08/2018 CLINICAL DATA:  Large ulcerated gastric fundal mass found on upper endoscopy today. EXAM: CT CHEST, ABDOMEN AND PELVIS WITHOUT CONTRAST TECHNIQUE: Multidetector CT imaging of the chest, abdomen and pelvis was performed following the standard protocol without IV contrast. COMPARISON:  CT chest 01/20/2018 FINDINGS: CT CHEST FINDINGS Cardiovascular: The heart size is normal. No substantial pericardial effusion. Coronary artery calcification is evident. Atherosclerotic calcification is noted in the wall of the thoracic aorta. Right Port-A-Cath tip is positioned in the distal SVC near the junction with the RA. Mediastinum/Nodes: 12 mm short axis prevascular node seen on 17/3. Scattered small central mediastinal lymph nodes identified in the upper mediastinum. 12 mm short axis retrotracheal node visible on 16/3. No subcarinal or lower paraesophageal lymphadenopathy. No evidence for gross hilar lymphadenopathy although assessment is limited by the lack of intravenous contrast on today's study. There is no axillary lymphadenopathy. Lungs/Pleura:  The central tracheobronchial airways are patent. 19 mm ring-like lesion in the right upper lobe is stable and may represent airway impaction with associated tree-in-bud nodularity. 6 mm medial right upper lobe nodule visible on 67/4. 5 mm right middle lobe nodule visible on 102/4, stable. 1.9 x 1.2 cm irregular opacity in the posterior left upper lobe is at the site of the 4.2 x 3.8  cm mass seen on PET-CT of 10/11/2015 and may reflect residual scarring. Small left pleural effusion evident. Musculoskeletal: No worrisome lytic or sclerotic osseous abnormality. CT ABDOMEN PELVIS FINDINGS Hepatobiliary: No focal abnormality in the liver on this study without intravenous contrast. Gallbladder surgically absent. No intrahepatic or extrahepatic biliary dilation. Pancreas: No focal mass lesion. No dilatation of the main duct. No intraparenchymal cyst. No peripancreatic edema. Spleen: Subtle 2.7 x 2.0 cm hypoattenuating lesion is identified in the dome of spleen, not definitely seen on prior. Adrenals/Urinary Tract: Right adrenal gland unremarkable. 1.6 cm left adrenal nodule stable since PET-CT of 10/01/2015. Cortical scarring noted in both kidneys. No evidence for hydroureter. The urinary bladder appears normal for the degree of distention. Stomach/Bowel: Tiny hiatal hernia noted. Wall thickening is noted in the esophagogastric junction and proximal stomach. Wall thickening extends along the medial aspect of the proximal stomach. Distal stomach unremarkable. Duodenum is normally positioned as is the ligament of Treitz. No small bowel wall thickening. No small bowel dilatation. The terminal ileum is normal. The appendix is not visualized, but there is no edema or inflammation in the region of the cecum. No gross colonic mass. No colonic wall thickening. Vascular/Lymphatic: There is abdominal aortic atherosclerosis without aneurysm. Bulky lymphadenopathy is seen in the hepato duodenal ligament. 3.2 x 4.5 cm lymph node seen on 55/3. The 2.9 x 2.8 cm lymph node seen on 57/3. 10 mm short axis portal caval lymph node (63/3) is suspicious. No para-aortic retroperitoneal lymphadenopathy. No pelvic sidewall lymphadenopathy. Reproductive: The uterus is surgically absent. There is no adnexal mass. Other: No intraperitoneal free fluid. Musculoskeletal: No worrisome lytic or sclerotic osseous abnormality.  IMPRESSION: 1. Marked wall thickening in the esophagogastric junction and proximal stomach, consistent with known neoplasm. Bulky lymphadenopathy in the gastrohepatic ligament is compatible with metastatic disease. 10 mm short axis portal caval lymph node also suspicious. 2. 2.7 cm hypoattenuating lesion in the spleen. This is not well assessed on today's noncontrast exam. Close attention recommended as metastatic disease not excluded. 3. Scattered tiny pulmonary nodules measuring up to 5 mm. Nonspecific, but close attention recommended as metastatic disease not excluded. 4. Mild lymphadenopathy noted in the upper mediastinum. 5. Circular lesion right upper lobe on axial imaging has a branching configuration on coronal and sagittal images. This is probably airway impaction related to atypical infection. Close follow-up recommended. 6. 1.9 x 1.2 cm irregular lesion posterior left upper lobe at the site of a 4 cm mass on PET-CT of 10/11/2015. Imaging features today are compatible with residua of that lesion. 7. 1.6 cm left adrenal nodule stable since 10/11/2015, likely benign. 8.  Aortic Atherosclerois (ICD10-170.0) Electronically Signed   By: Misty Stanley M.D.   On: 03/08/2018 19:19   Dg Chest 2 View  Result Date: 03/03/2018 CLINICAL DATA:  Weakness, dizziness and confusion over the last 2 weeks. History of renal cell cancer and myeloma. EXAM: CHEST - 2 VIEW COMPARISON:  02/26/2018 radiography.  CT 01/20/2018. FINDINGS: Heart size is normal. Chronic aortic atherosclerosis. Power port inserted from a right jugular approach has its tip at the SVC RA junction.  The lungs are clear except for mild scarring. No sign of active infiltrate, mass, effusion or collapse. IMPRESSION: No active cardiopulmonary disease. Electronically Signed   By: Nelson Chimes M.D.   On: 03/03/2018 12:58   Ct Head Wo Contrast  Result Date: 03/05/2018 CLINICAL DATA:  Headache beginning this afternoon. History of multiple myeloma,  thrombocytopenia and stroke. EXAM: CT HEAD WITHOUT CONTRAST TECHNIQUE: Contiguous axial images were obtained from the base of the skull through the vertex without intravenous contrast. COMPARISON:  MRI of the head March 03, 2018 and CT HEAD March 03, 2018. FINDINGS: BRAIN: Patchy LEFT > RIGHT parietal and LEFT occipital hypodensities are increasingly conspicuous from prior CT. No propagation. No intraparenchymal hemorrhage, mass effect or midline shift. Patchy LEFT frontal white matter hypodensities most compatible with chronic small vessel ischemic changes. Old small LEFT cerebellar infarct. No parenchymal brain volume loss for age. No hydrocephalus. No abnormal extra-axial fluid collections. VASCULAR: Moderate calcific atherosclerosis of the carotid siphons. SKULL: No skull fracture. Moderate temporomandibular osteoarthrosis. No significant scalp soft tissue swelling. SINUSES/ORBITS: Trace paranasal sinus mucosal thickening. Mastoid air cells are well aerated.The included ocular globes and orbital contents are non-suspicious. Status post bilateral ocular lens implants. OTHER: None. IMPRESSION: 1. Involving acute LEFT > RIGHT parietal and LEFT occipital lobe infarcts without hemorrhagic conversion. 2. Mild-to-moderate chronic small vessel ischemic changes. Old small LEFT cerebellar infarct. Electronically Signed   By: Elon Alas M.D.   On: 03/05/2018 17:25   Ct Head Wo Contrast  Result Date: 03/03/2018 CLINICAL DATA:  Confused. Altered level of consciousness. Breast cancer. EXAM: CT HEAD WITHOUT CONTRAST TECHNIQUE: Contiguous axial images were obtained from the base of the skull through the vertex without intravenous contrast. COMPARISON:  02/26/2018. FINDINGS: Brain: Two separate areas of ill-defined abnormal cortical and subcortical edema in the LEFT hemisphere, affecting the LEFT parietal and LEFT occipital lobes, could represent multifocal infarcts versus rapid progression of metastatic  disease. These areas were previously unremarkable on CT 5 days earlier. MRI of the brain without and with contrast recommended for further evaluation. Elsewhere, mild to moderate atrophy. Hypoattenuation of white matter, consistent with small vessel disease/post treatment effect. Vascular: Calcification of the cavernous internal carotid arteries consistent with cerebrovascular atherosclerotic disease. No signs of intracranial large vessel occlusion. Skull: Calvarium intact. Sinuses/Orbits: No sinus disease. Negative orbits. Other: None. IMPRESSION: Two separate areas of abnormal cortical and subcortical edema in the LEFT hemisphere, affecting the LEFT parietal and LEFT occipital lobes, could represent multifocal infarcts versus rapid progression of metastatic disease. These were not present on the previous CT from 02/26/2018. MRI of the brain without and with contrast recommended for further evaluation. Electronically Signed   By: Staci Righter M.D.   On: 03/03/2018 13:26   Ct Head Wo Contrast  Result Date: 02/26/2018 CLINICAL DATA:  Blackouts for 1 month. History of multiple myeloma. EXAM: CT HEAD WITHOUT CONTRAST TECHNIQUE: Contiguous axial images were obtained from the base of the skull through the vertex without intravenous contrast. COMPARISON:  12/22/2015. FINDINGS: Brain: No evidence for acute infarction, hemorrhage, mass lesion, hydrocephalus, or extra-axial fluid. Mild atrophy. Hypoattenuation of white matter, consistent with small vessel disease or post treatment effect. 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 Vascular: Calcification of the cavernous internal carotid arteries consistent with cerebrovascular atherosclerotic disease. No signs of intracranial large vessel occlusion. Skull: Calvarium intact. Small lucency in the RIGHT frontal bone (series 3 image 50) unchanged from 2017. Sinuses/Orbits: No acute finding. Other: None. IMPRESSION: Atrophy and small vessel disease.  No acute  intracranial findings. Electronically Signed   By: Staci Righter M.D.   On: 02/26/2018 18:23   Ct Chest Wo Contrast  Result Date: 03/08/2018 CLINICAL DATA:  Large ulcerated gastric fundal mass found on upper endoscopy today. EXAM: CT CHEST, ABDOMEN AND PELVIS WITHOUT CONTRAST TECHNIQUE: Multidetector CT imaging of the chest, abdomen and pelvis was performed following the standard protocol without IV contrast. COMPARISON:  CT chest 01/20/2018 FINDINGS: CT CHEST FINDINGS Cardiovascular: The heart size is normal. No substantial pericardial effusion. Coronary artery calcification is evident. Atherosclerotic calcification is noted in the wall of the thoracic aorta. Right Port-A-Cath tip is positioned in the distal SVC near the junction with the RA. Mediastinum/Nodes: 12 mm short axis prevascular node seen on 17/3. Scattered small central mediastinal lymph nodes identified in the upper mediastinum. 12 mm short axis retrotracheal node visible on 16/3. No subcarinal or lower paraesophageal lymphadenopathy. No evidence for gross hilar lymphadenopathy although assessment is limited by the lack of intravenous contrast on today's study. There is no axillary lymphadenopathy. Lungs/Pleura: The central tracheobronchial airways are patent. 19 mm ring-like lesion in the right upper lobe is stable and may represent airway impaction with associated tree-in-bud nodularity. 6 mm medial right upper lobe nodule visible on 67/4. 5 mm right middle lobe nodule visible on 102/4, stable. 1.9 x 1.2 cm irregular opacity in the posterior left upper lobe is at the site of the 4.2 x 3.8 cm mass seen on PET-CT of 10/11/2015 and may reflect residual scarring. Small left pleural effusion evident. Musculoskeletal: No worrisome lytic or sclerotic osseous abnormality. CT ABDOMEN PELVIS FINDINGS Hepatobiliary: No focal abnormality in the liver on this study without intravenous contrast. Gallbladder surgically absent. No intrahepatic or extrahepatic  biliary dilation. Pancreas: No focal mass lesion. No dilatation of the main duct. No intraparenchymal cyst. No peripancreatic edema. Spleen: Subtle 2.7 x 2.0 cm hypoattenuating lesion is identified in the dome of spleen, not definitely seen on prior. Adrenals/Urinary Tract: Right adrenal gland unremarkable. 1.6 cm left adrenal nodule stable since PET-CT of 10/01/2015. Cortical scarring noted in both kidneys. No evidence for hydroureter. The urinary bladder appears normal for the degree of distention. Stomach/Bowel: Tiny hiatal hernia noted. Wall thickening is noted in the esophagogastric junction and proximal stomach. Wall thickening extends along the medial aspect of the proximal stomach. Distal stomach unremarkable. Duodenum is normally positioned as is the ligament of Treitz. No small bowel wall thickening. No small bowel dilatation. The terminal ileum is normal. The appendix is not visualized, but there is no edema or inflammation in the region of the cecum. No gross colonic mass. No colonic wall thickening. Vascular/Lymphatic: There is abdominal aortic atherosclerosis without aneurysm. Bulky lymphadenopathy is seen in the hepato duodenal ligament. 3.2 x 4.5 cm lymph node seen on 55/3. The 2.9 x 2.8 cm lymph node seen on 57/3. 10 mm short axis portal caval lymph node (63/3) is suspicious. No para-aortic retroperitoneal lymphadenopathy. No pelvic sidewall lymphadenopathy. Reproductive: The uterus is surgically absent. There is no adnexal mass. Other: No intraperitoneal free fluid. Musculoskeletal: No worrisome lytic or sclerotic osseous abnormality. IMPRESSION: 1. Marked wall thickening in the esophagogastric junction and proximal stomach, consistent with known neoplasm. Bulky lymphadenopathy in the gastrohepatic ligament is compatible with metastatic disease. 10 mm short axis portal caval lymph node also suspicious. 2. 2.7 cm hypoattenuating lesion in the spleen. This is not well assessed on today's noncontrast  exam. Close attention recommended as metastatic disease not excluded. 3. Scattered tiny pulmonary nodules measuring  up to 5 mm. Nonspecific, but close attention recommended as metastatic disease not excluded. 4. Mild lymphadenopathy noted in the upper mediastinum. 5. Circular lesion right upper lobe on axial imaging has a branching configuration on coronal and sagittal images. This is probably airway impaction related to atypical infection. Close follow-up recommended. 6. 1.9 x 1.2 cm irregular lesion posterior left upper lobe at the site of a 4 cm mass on PET-CT of 10/11/2015. Imaging features today are compatible with residua of that lesion. 7. 1.6 cm left adrenal nodule stable since 10/11/2015, likely benign. 8.  Aortic Atherosclerois (ICD10-170.0) Electronically Signed   By: Misty Stanley M.D.   On: 03/08/2018 19:19   Mr Brain Wo Contrast  Result Date: 03/03/2018 CLINICAL DATA:  Sudden onset confusion. EXAM: MRI HEAD WITHOUT CONTRAST TECHNIQUE: Multiplanar, multiecho pulse sequences of the brain and surrounding structures were obtained without intravenous contrast. COMPARISON:  Head CT 03/03/2018 FINDINGS: The examination had to be discontinued prior to completion due to altered mental status and inability to follow the technologist's instructions. Coronal and axial diffusion-weighted imaging, sagittal T1-weighted imaging and axial T2-weighted imaging were obtained. There is multifocal abnormal diffusion restriction within both hemispheres, including the left frontal lobe, both parietal lobes, left occipital lobe and both cerebellar hemispheres. The largest area of diffusion abnormalities in the posterior left parietal lobe. There is no midline shift or other mass effect. The midline structures are normal. There is moderate white matter hyperintense T2-weighted signal consistent with chronic microvascular ischemia. IMPRESSION: 1. Examination discontinued early due to patient altered mental status. 2.  Multifocal bilateral acute ischemia within multiple vascular territories in both hemispheres. This may indicate a central cardiac or aortic embolic source. Electronically Signed   By: Ulyses Jarred M.D.   On: 03/03/2018 15:04   Dg Abd Acute 2+v W 1v Chest  Result Date: 02/26/2018 CLINICAL DATA:  Lapse in memory. EXAM: DG ABDOMEN ACUTE W/ 1V CHEST COMPARISON:  None. FINDINGS: There is no evidence of dilated bowel loops or free intraperitoneal air. No radiopaque calculi or other significant radiographic abnormality is seen. Heart size and mediastinal contours are within normal limits. Both lungs are clear. Calcified tortuous aorta. Degenerative RIGHT SI joint. Port-A-Cath, RIGHT chest subclavian approach, tip RIGHT atrium. IMPRESSION: Negative abdominal radiographs. No acute cardiopulmonary disease. Electronically Signed   By: Staci Righter M.D.   On: 02/26/2018 18:16   Dg Swallowing Func-speech Pathology  Result Date: 03/06/2018 Objective Swallowing Evaluation: Type of Study: MBS-Modified Barium Swallow Study  Patient Details Name: Nancy Norris MRN: [id number] Date of Birth: Jan 10, 1941 Today's Date: 03/06/2018 Time: SLP Start Time (ACUTE ONLY): 0825 -SLP Stop Time (ACUTE ONLY): 0840 SLP Time Calculation (min) (ACUTE ONLY): 15 min Past Medical History: Past Medical History: Diagnosis Date . Anemia  . Anginal pain ([place])   used NTG x 2 May 31 and 06/15/13  . Anxiety  . Arthritis  . B12 deficiency 12/04/2014 . Breast cancer ([place])  . Complication of anesthesia  . COPD (chronic obstructive pulmonary disease) ([place])  . 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 Depression  . Dizziness  . Dyspnea  . Fibromyalgia  . Fibromyalgia  . GERD (gastroesophageal reflux disease)  . Headache(784.0)  . Heart murmur  . Hemoptysis 10/21/2015 . History of blood transfusion   last one May 12  . Hx of cardiovascular stress test   LexiScan with low level exercise Myoview (02/2013): No ischemia, EF 72%; normal study . Hx of echocardiogram    a.  Echocardiogram (12/26/2012): EF 60-65%, grade  1 diastolic dysfunction;   b.  Echocardiogram (02/2013): EF 55-60%, no WMA, trivial effusion . Hyperkalemia  . Hyponatremia  . Hypotension  . Hypothyroidism  . Mucositis  . Multiple myeloma  . Myocardial infarction [facility])   in past, patient was unaware.  . Neuropathy  . Nodule of left lung 09/13/2015 . Pain in joint, pelvic region and thigh 07/07/2015 . Pneumonia   several . PONV (postoperative nausea and vomiting) 2008  after mastestomy Past Surgical History: Past Surgical History: Procedure Laterality Date . ABDOMINAL HYSTERECTOMY  1981 . AV FISTULA PLACEMENT Left 06/19/2013  Procedure: CREATION OF LEFT ARM ARTERIOVENOUS (AV) FISTULA ;  Surgeon: Angelia Mould, MD;  Location: [facility];  Service: Vascular;  Laterality: Left; . BREAST RECONSTRUCTION   . BREAST SURGERY Right   reduction . CATARACT EXTRACTION, BILATERAL   . CHOLECYSTECTOMY  1971 . COLONOSCOPY   . EYE SURGERY Bilateral   lens implant . history of Port removal   . LUNG BIOPSY  10/21/2015 . MASTECTOMY Left 2008 . PORTACATH PLACEMENT  12/2012  has had 2 . RESECTION OF ARTERIOVENOUS FISTULA ANEURYSM Left 06/04/2017  Procedure: LIGATION ANEURYSM OF LEFT ARTERIOVENOUS FISTULA;  Surgeon: Angelia Mould, MD;  Location: [facility];  Service: Vascular;  Laterality: Left; . Status post stem cell transplant on September 28, 2008.   HPI: Patient is a 78 y/o female presents with confusion, amnesia and HA. Head CT- left occipital and left frontal parietial infarcts. MRI- multiple posterior/anterior bilateral infarcts. PMH includes multiple myeloma currently getting chemo, MI, COPD, hypotension, breast ca, fibromyalgia.  Subjective: pleasant Assessment / Plan / Recommendation CHL IP CLINICAL IMPRESSIONS 03/06/2018 Clinical Impression Pt demonstrated reduced lingual retraction which resulted in mild vallecular residue and transient penetration (PAS 2) with thin liquids when consecutive swallows were used. [name]  independent use of dry secondary swallows was effective in reducing the residue and residue was eliminated with use of aliquid wash. Overall, her swallow mechanism appears to be within functional limits with consideration of her age and she does not appear to be at significant risk of aspiration at this time. It is recommended that a regular texture diet be continued but, with consideration of her reported xerostomia, liquid washes may be beneficial to improve bolus flow. SLP will continue to follow for cognitive-linguisitic treatment and will see the pt once more for swallowing to ensure her observance of compensatory strategies. SLP Visit Diagnosis Dysphagia, unspecified (R13.10) Attention and concentration deficit following -- Frontal lobe and executive function deficit following -- Impact on safety and function No limitations   CHL IP TREATMENT RECOMMENDATION 03/06/2018 Treatment Recommendations Therapy as outlined in treatment plan below   Prognosis 03/06/2018 Prognosis for Safe Diet Advancement Good Barriers to Reach Goals Cognitive deficits Barriers/Prognosis Comment -- CHL IP DIET RECOMMENDATION 03/06/2018 SLP Diet Recommendations Regular solids;Thin liquid Liquid Administration via Cup;Straw Medication Administration Whole meds with liquid Compensations Small sips/bites;Slow rate;Follow solids with liquid Postural Changes Remain semi-upright after after feeds/meals (Comment);Seated upright at 90 degrees   No flowsheet data found.  CHL IP FOLLOW UP RECOMMENDATIONS 03/06/2018 Follow up Recommendations Inpatient Rehab   CHL IP FREQUENCY AND DURATION 03/06/2018 Speech Therapy Frequency (ACUTE ONLY) min 2x/week Treatment Duration 2 weeks      CHL IP ORAL PHASE 03/06/2018 Oral Phase WFL Oral - Pudding Teaspoon -- Oral - Pudding Cup -- Oral - Honey Teaspoon -- Oral - Honey Cup -- Oral - Nectar Teaspoon -- Oral - Nectar Cup -- Oral - Nectar Straw -- Oral - Thin  Teaspoon -- Oral - Thin Cup -- Oral - Thin Straw -- Oral -  Puree -- Oral - Mech Soft -- Oral - Regular -- Oral - Multi-Consistency -- Oral - Pill -- Oral Phase - Comment --  CHL IP PHARYNGEAL PHASE 03/06/2018 Pharyngeal Phase WFL Pharyngeal- Pudding Teaspoon -- Pharyngeal -- Pharyngeal- Pudding Cup -- Pharyngeal -- Pharyngeal- Honey Teaspoon -- Pharyngeal -- Pharyngeal- Honey Cup -- Pharyngeal -- Pharyngeal- Nectar Teaspoon -- Pharyngeal -- Pharyngeal- Nectar Cup -- Pharyngeal -- Pharyngeal- Nectar Straw -- Pharyngeal -- Pharyngeal- Thin Teaspoon -- Pharyngeal -- Pharyngeal- Thin Cup Penetration/Aspiration during swallow Pharyngeal Material enters airway, remains ABOVE vocal cords then ejected out Pharyngeal- Thin Straw Penetration/Aspiration during swallow Pharyngeal Material enters airway, remains ABOVE vocal cords then ejected out Pharyngeal- Puree Penetration/Aspiration before swallow;Penetration/Aspiration during swallow;Pharyngeal residue - valleculae Pharyngeal Material enters airway, remains ABOVE vocal cords then ejected out Pharyngeal- Mechanical Soft -- Pharyngeal -- Pharyngeal- Regular Pharyngeal residue - valleculae Pharyngeal -- Pharyngeal- Multi-consistency -- Pharyngeal -- Pharyngeal- Pill -- Pharyngeal -- Pharyngeal Comment --  Shanika I. Hardin Negus, [place], [place] Office number [phone] Pager [place] 03/06/2018, 9:42 AM              Mr [place] Wo Contrast  Result Date: 03/03/2018 CLINICAL DATA:  Stroke follow-up EXAM: MRA HEAD WITHOUT CONTRAST TECHNIQUE: Angiographic images of the Circle of Willis were obtained using MRA technique without intravenous contrast. COMPARISON:  Brain MRI 03/03/2018 FINDINGS: POSTERIOR CIRCULATION: --Basilar artery: Normal. --Posterior cerebral arteries: Normal. Both originate from the basilar artery. --Superior cerebellar arteries: Normal. --Inferior cerebellar arteries: Normal anterior and posterior inferior cerebellar arteries. ANTERIOR CIRCULATION: --Intracranial  internal carotid arteries: Normal. --Anterior cerebral arteries: Normal. Both A1 segments are present. Patent anterior communicating artery. --Middle cerebral arteries: Normal. --Posterior communicating arteries: Absent bilaterally. IMPRESSION: Normal intracranial MRA. Electronically Signed   By: Ulyses Jarred M.D.   On: 03/03/2018 22:51   Vas US Carotid (at [place] Only)  Result Date: 03/06/2018 Carotid Arterial Duplex Study Indications: CVA. Performing Technologist: Oliver Hum RVT  Examination Guidelines: A complete evaluation includes B-mode imaging, spectral Doppler, color Doppler, and power Doppler as needed of all accessible portions of each vessel. Bilateral testing is considered an integral part of a complete examination. Limited examinations for reoccurring indications may be performed as noted.  Right Carotid Findings: +----------+--------+-------+--------+----------------------+------------------+           PSV cm/sEDV    StenosisDescribe              Comments                             cm/s                                                    +----------+--------+-------+--------+----------------------+------------------+ CCA Prox  68      18                                   intimal thickening +----------+--------+-------+--------+----------------------+------------------+ CCA Distal77      20  intimal thickening +----------+--------+-------+--------+----------------------+------------------+ ICA Prox  72      21             smooth and                                                                heterogenous                             +----------+--------+-------+--------+----------------------+------------------+ ICA Distal70      27                                                      +----------+--------+-------+--------+----------------------+------------------+ ECA       63      13                                                       +----------+--------+-------+--------+----------------------+------------------+ +----------+--------+-------+--------+-------------------+           PSV cm/sEDV cmsDescribeArm Pressure (mmHG) +----------+--------+-------+--------+-------------------+ [id number]                                         +----------+--------+-------+--------+-------------------+ +---------+--------+--+--------+-+---------+ VertebralPSV cm/s31EDV cm/s9Antegrade +---------+--------+--+--------+-+---------+  Left Carotid Findings: +----------+--------+--------+--------+-----------------------+--------+           PSV cm/sEDV cm/sStenosisDescribe               Comments +----------+--------+--------+--------+-----------------------+--------+ CCA Prox  82      23              smooth and heterogenous         +----------+--------+--------+--------+-----------------------+--------+ CCA Distal64      19              smooth and heterogenous         +----------+--------+--------+--------+-----------------------+--------+ ICA Prox  66      20              smooth and heterogenous         +----------+--------+--------+--------+-----------------------+--------+ ICA Distal81      32                                              +----------+--------+--------+--------+-----------------------+--------+ ECA       70      17                                              +----------+--------+--------+--------+-----------------------+--------+ +----------+--------+--------+--------+-------------------+ SubclavianPSV cm/sEDV cm/sDescribeArm Pressure (mmHG) +----------+--------+--------+--------+-------------------+           96                                          +----------+--------+--------+--------+-------------------+ +---------+--------+--+--------+--+---------+  VertebralPSV cm/s54EDV cm/s19Antegrade  +---------+--------+--+--------+--+---------+  Summary: Right Carotid: Velocities in the right ICA are consistent with a 1-39% stenosis. Left Carotid: Velocities in the left ICA are consistent with a 1-39% stenosis. Vertebrals: Bilateral vertebral arteries demonstrate antegrade flow. *See table(s) above for measurements and observations.  Electronically signed by Antony Contras MD on 03/06/2018 at 11:57:28 AM.    Final    Vas [place] Lower Extremity Venous (dvt)  Result Date: 03/07/2018  Lower Venous Study Other Indications: Stroke with PFO. Performing Technologist: June Leap RDMS, RVT  Examination Guidelines: A complete evaluation includes B-mode imaging, spectral Doppler, color Doppler, and power Doppler as needed of all accessible portions of each vessel. Bilateral testing is considered an integral part of a complete examination. Limited examinations for reoccurring indications may be performed as noted.  Right Venous Findings: +---------+---------------+---------+-----------+----------+-------------------+          CompressibilityPhasicitySpontaneityPropertiesSummary             +---------+---------------+---------+-----------+----------+-------------------+ CFV      Full           Yes      Yes                                      +---------+---------------+---------+-----------+----------+-------------------+ SFJ      Full                                                             +---------+---------------+---------+-----------+----------+-------------------+ FV Prox  Full                                                             +---------+---------------+---------+-----------+----------+-------------------+ FV Mid   Full                                                             +---------+---------------+---------+-----------+----------+-------------------+ FV DistalFull                                                              +---------+---------------+---------+-----------+----------+-------------------+ PFV      Full                                                             +---------+---------------+---------+-----------+----------+-------------------+ POP      Full           Yes      Yes                                      +---------+---------------+---------+-----------+----------+-------------------+  PTV      Full                                         not well visualized +---------+---------------+---------+-----------+----------+-------------------+ PERO     Full                                         not well visualized +---------+---------------+---------+-----------+----------+-------------------+  Left Venous Findings: +---------+---------------+---------+-----------+----------+------------------+          CompressibilityPhasicitySpontaneityPropertiesSummary            +---------+---------------+---------+-----------+----------+------------------+ CFV      Full           Yes      Yes                                     +---------+---------------+---------+-----------+----------+------------------+ SFJ      Full                                                            +---------+---------------+---------+-----------+----------+------------------+ FV Prox  Full                                                            +---------+---------------+---------+-----------+----------+------------------+ FV Mid   Full                                                            +---------+---------------+---------+-----------+----------+------------------+ FV DistalFull                                                            +---------+---------------+---------+-----------+----------+------------------+ PFV      Full                                                             +---------+---------------+---------+-----------+----------+------------------+ POP      Full           Yes      Yes                                     +---------+---------------+---------+-----------+----------+------------------+ PTV      None  isolated proximal                                                        calf               +---------+---------------+---------+-----------+----------+------------------+ PERO     None                                         isolated proximal                                                        calf               +---------+---------------+---------+-----------+----------+------------------+    Summary: Right: There is no evidence of deep vein thrombosis in the lower extremity. No cystic structure found in the popliteal fossa. Left: Findings consistent with acute deep vein thrombosis involving the left posterior tibial vein, and left peroneal vein. No cystic structure found in the popliteal fossa.  *See table(s) above for measurements and observations. Electronically signed by Deitra Mayo MD on 03/07/2018 at 7:51:58 PM.    Final    Dg Esophagus W Single Cm (sol Or Thin Ba)  Result Date: 03/06/2018 CLINICAL DATA:  Dysphagia EXAM: ESOPHOGRAM/BARIUM SWALLOW TECHNIQUE: Single contrast examination was performed using thick and thin barium FLUOROSCOPY TIME:  Fluoroscopy Time:  1 minutes, 48 seconds Radiation Exposure Index (if provided by the fluoroscopic device): 17.9 mGy Number of Acquired Spot Images: None COMPARISON:  CT chest from 01/20/2018 FINDINGS: The patient was relatively frail, and I elected not to give gas crystals. With patient laying down, swallows were performed in the LPO position. Mild distal esophageal fold thickening. No obvious ulceration. Mildly dilated esophagus. Primary peristaltic waves in the esophagus were disrupted on 4/4 swallows in the mid esophagus level.  Initial swallows demonstrate some lobularity and irregularity along the upper margin of the gastroesophageal junction for example on image 35/1. Tumor or ulceration not readily excluded given this appearance. In the LPO position, there is poor filling of the gastroesophageal junction, with only slow percolated shin of contrast from the dilated esophagus into the stomach. A 13 mm barium tablet passed without difficulty into the stomach. Limited assessment of the upper stomach demonstrates no appreciable gastric diverticulum along the right margin of the upper stomach, raising concern that the rounded density in this vicinity on the recent chest CT from 01/20/2018 measuring 3.6 by 2.6 cm on that exam probably represents gastrohepatic ligament pathologic adenopathy rather than a diverticulum. IMPRESSION: 1. Nonspecific esophageal dysmotility disorder with disruption of primary peristaltic waves in the mid esophagus. 2. Distal esophageal irregularity potentially with ulceration. Difficult to exclude tumor of the distal esophagus. Endoscopy is recommended. 3. 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 Electronically Signed   By: Van Clines M.D.   On: 03/06/2018 09:31  Labs:  CBC: Recent Labs    03/06/18 0500 03/07/18 0550 03/08/18 0532 03/09/18 0423  WBC 2.0* 2.3* 2.7* 3.3*  HGB 7.2* 7.4* 7.3* 7.2*  HCT 22.1* 23.3* 24.1* 23.5*  PLT 66* 72* 72* 67*    COAGS: Recent Labs    03/03/18 1243 03/09/18 0423  INR 0.90 1.01    BMP: Recent Labs    03/06/18 0500 03/07/18 0550 03/08/18 0532 03/09/18 0423  NA 140 140 140 137  K 4.8 3.3* 3.6 3.6  CL 106 106 108 105  CO2 22 26 21* 23  GLUCOSE 100* 101* 95 102*  BUN '17 16 14 13  '$ CALCIUM 6.1* 8.5* 8.2*  8.3*  CREATININE 2.56* 2.76* 2.47* 2.34*  GFRNONAA 18* 16* 18* 20*  GFRAA 20* 19* 21* 23*    LIVER FUNCTION TESTS: Recent Labs    02/26/18 1154 02/26/18 1636 03/03/18 1243 03/04/18 0500  BILITOT 0.9 0.7 1.1 0.4  AST 9* '15 16 15  '$ ALT '7 10 9 9  '$ ALKPHOS 83 74 83 61  PROT 5.4* 5.1* 5.6* 4.6*  ALBUMIN 3.0* 3.0* 3.5 2.7*    TUMOR MARKERS: No results for input(s): AFPTM, CEA, CA199, CHROMGRNA in the last 8760 hours.  Assessment and Plan:  Hx MM Ongoing Tx New CVA- embolic ; New finding of PFO EGD revealing fungating gastric tumor New LLE DVT High risk for bleed if anticoagulated Scheduled for retrievable inferior vena cava filter placement Risks and benefits discussed with the patient including, but not limited to bleeding, infection, contrast induced renal failure, filter fracture or migration which can lead to emergency surgery or even death, strut penetration with damage or irritation to adjacent structures and caval thrombosis.  All of the patient's questions were answered, patient is agreeable to proceed. Consent signed and in chart.   Thank you for this interesting consult.  I greatly enjoyed meeting CAMIRA GEIDEL and look forward to participating in their care.  A copy of this report was sent to the requesting provider on this date.  Electronically Signed: Lavonia Drafts, PA-C 03/09/2018, 8:34 AM   I spent a total of 40 Minutes    in face to face in clinical consultation, greater than 50% of which was counseling/coordinating care for  Retrievable IVC filter

## 2018-03-09 NOTE — Progress Notes (Addendum)
PROGRESS NOTE  Nancy Norris IWO:[id number] DOB: 06-Dec-1940 DOA: 03/03/2018 PCP: [name], MD  HPI/Recap of past 24 hours:   Returned from [employer] filter placement She denies pain, no fever  She reports chronic dysphagia, no vomiting, no frank bleeding  Denies headache, she is aaox3 today She reports  Confusion (presenting symptom) almost resolved, speech is improving, but frustrated about her writing,      Assessment/Plan: Principal Problem:   Stroke ([place]) Active Problems:   HX: breast cancer   Hypothyroid   GERD (gastroesophageal reflux disease)   COPD (chronic obstructive pulmonary disease) with emphysema (HCC)   Multiple myeloma (HCC)   Thrombocytopenia (HCC)   Symptomatic anemia   Dysphagia   CKD (chronic kidney disease), stage IV ([place])   Acute cardioembolic stroke ([place])   CKD (chronic kidney disease), stage III ([place])   Acute DVT (deep venous thrombosis) ([place])   Malignant neoplasm of stomach ([place])  Acute bilateral multifocal CVA (presenting symptom): -patient presented with encaphlopathy/altered mental status -Multifocal bilateral acute ischemia within multiple vascular territories in both hemispheres. This may indicate a central cardiac or aortic embolic source. -MRA "Normal intracranial MRA." -has been sinus rhythm on tele -TEE planned on 2/21, will need venous doppler to r/o DVT if patient has PFO -a1c, ldl 85 -No antithrombotic prior to admission, now on aspirin 325 mg daily. Continue aspirin, will follow neurology recommendation  Addendum: patient c/o headache on 2/19, ct head no acute interval changes   EGD concerning for gi cancer with high bleeding risk She is found to have acute DVT left lower extremity (Left: Findings consistent with acute deep vein thrombosis involving the left posterior tibial vein, and left peroneal vein. ) Case discussed with hematology/oncology Dr Alen Blew who  Recommend proceed with IVC filter and cancer staging scan  concerning for mets to lymphanode S/p retrievable inferior vena cava filter placement on 2/23  Dysphagia/Gastric cancer ( new diagnosis) -she does reports ongoing dysphagia for several months, ab pain when she eats in the last few months, she denies n/v, states stool is brown -MBS unremarkable,  -dg esophagus concerning for distal esophageal ulcer vs tumor -EGD on 2/22 showed "Large fungating malignant-appearing ulcerated proximal stomach mass that has areas of blood seen consistent with recent bleeding" pathology pending -[facility] input appreciated,   hematology/oncology consulted, will follow recommendation,  -gi also recommended general surgery consult  Pancytopenia  / multiple myeloma  -h/o bone marrow transplant in 2010 -case discussed with oncology Dr Julien Nordmann who states patient is getting prn treatment for MM to preserve renal function, multiple myeloma panel is stable -hematology/oncology formally consulted on 2/22 due to above issues    H/o breast cancer , s/p left mastectomy in 2008   Chronic kidney disease stage IV: H/o avf placement in 2015 With a baseline creatinine in 3-2.5 Continue outpatient follow up with nephrology    Chronic Hypotension:  Continue home meds  Midrodrine,  hold home meds lasix  COPD: Seems to be stable On anoro andsingulair  GERD: on carafate and zantac   Dry mouth: on pilocarpine  Hypocalcemia: s/p calcium gluconate , ca improved    Initial bcid + proteus and gpc Blood culture no growth, no fever, no leukocytosis D/c vanc,  D/c rocephin, change to keflex for 4days, to cover possible proteus due to h/o port cath and immunosuppressed status  NSVTx1  8beats of NSVT on tele on 2/23 am while she is awake, she is asymptomatic Keep tele, keep K.4, check mag  Code  Status: DNR  Family Communication: patient and daughter lisa over the phone  Disposition Plan:   CIR hopefully early next week     Consultants:  Neurology for acute  cva  Cardiology for TEE  Oncology Dr Julien Nordmann, Dr Alen Blew for new diagnosis of gastric cancer /acute dvt/ multiple myeloma    [facility]  CIR  IR Dr Barbie Banner for ivc filter placement  Procedures:  TEE on 2/21  Dg esophagus on 2/20  MBS  on 2/20  EGD on 2/22  ivc filter placement on 2/23  Antibiotics:  Vanc/rocephin since admission to 2/21  Keflex from 2/21 to   Objective: BP 121/64 (BP Location: Right Arm)   Pulse 62   Temp 97.6 F (36.4 C) (Oral)   Resp 20   Ht _0  (1.651 m)   Wt 78.8 kg   SpO2 98%   BMI 28.91 kg/m   Intake/Output Summary (Last 24 hours) at 03/09/2018 1028 Last data filed at 03/08/2018 2230 Gross per 24 hour  Intake 240 ml  Output -  Net 240 ml   Filed Weights   03/07/18 0659 03/08/18 0730 03/09/18 0351  Weight: 79.3 kg 79.3 kg 78.8 kg    Exam: Patient is examined daily including today on 03/09/2018, exams remain the same as of yesterday except that has changed    General:  NAD, pale  Cardiovascular: RRR  Respiratory: CTABL  Abdomen: Soft/ND/NT, positive BS  Musculoskeletal: No Edema  Neuro: alert, oriented   Data Reviewed: Basic Metabolic Panel: Recent Labs  Lab 03/05/18 1106 03/06/18 0500 03/07/18 0550 03/08/18 0532 03/09/18 0423  NA 138 140 140 140 137  K 3.5 4.8 3.3* 3.6 3.6  CL 106 106 106 108 105  CO2 20* 22 26 21* 23  GLUCOSE 119* 100* 101* 95 102*  BUN _1 CREATININE 2.44* 2.56* 2.76* 2.47* 2.34*  CALCIUM 8.3* 6.1* 8.5* 8.2* 8.3*   Liver Function Tests: Recent Labs  Lab 03/03/18 1243 03/04/18 0500  AST 16 15  ALT 9 9  ALKPHOS 83 61  BILITOT 1.1 0.4  PROT 5.6* 4.6*  ALBUMIN 3.5 2.7*   No results for input(s): LIPASE, AMYLASE in the last 168 hours. Recent Labs  Lab 03/03/18 1341  AMMONIA 10   CBC: Recent Labs  Lab 03/03/18 1243  03/05/18 1106 03/06/18 0500 03/07/18 0550 03/08/18 0532 03/09/18 0423  WBC 2.4*   < > 2.0* 2.0* 2.3* 2.7* 3.3*  NEUTROABS 1.1*  --  0.8* 0.8*  --   1.2* 1.6*  HGB 8.3*   < > 7.0* 7.2* 7.4* 7.3* 7.2*  HCT 26.8*   < > 23.0* 22.1* 23.3* 24.1* 23.5*  MCV 104.7*   < > 103.1* 101.4* 102.6* 103.4* 103.1*  PLT 54*   < > 62* 66* 72* 72* 67*   < > = values in this interval not displayed.   Cardiac Enzymes:   No results for input(s): CKTOTAL, CKMB, CKMBINDEX, TROPONINI in the last 168 hours. BNP (last 3 results) Recent Labs    03/13/17 1212 01/20/18 1949  BNP 77 102.6*    ProBNP (last 3 results) No results for input(s): PROBNP in the last 8760 hours.  CBG: No results for input(s): GLUCAP in the last 168 hours.  Recent Results (from the past 240 hour(s))  Culture, blood (Routine x 2)     Status: None   Collection Time: 03/03/18 12:27 PM  Result Value Ref Range Status   Specimen Description BLOOD PORTA CATH  Final   Special Requests   Final    BOTTLES DRAWN AEROBIC AND ANAEROBIC Blood Culture adequate volume Performed at [facility] [address] [place], [place] [address]    Culture NO GROWTH 5 DAYS  Final   Report Status 03/08/2018 FINAL  Final  Culture, blood (Routine x 2)     Status: Abnormal (Preliminary result)   Collection Time: 03/03/18  1:41 PM  Result Value Ref Range Status   Specimen Description   Final    BLOOD RIGHT ANTECUBITAL Performed at [facility], [place] [address], [place], [place] [address]    Special Requests   Final    BOTTLES DRAWN AEROBIC AND ANAEROBIC Blood Culture adequate volume Performed at [facility] [address] [place], [place] [address]    Culture  Setup Time   Final    GRAM POSITIVE COCCI ANAEROBIC BOTTLE ONLY CRITICAL RESULT CALLED TO, READ BACK BY AND VERIFIED WITH: Jene Every PHARMD [id number] 03/04/18 A BROWNING Performed at [facility], [place] [address] [place], [place] [address]    Culture (A)  Final    STAPHYLOCOCCUS SPECIES (COAGULASE NEGATIVE) THE SIGNIFICANCE OF ISOLATING THIS ORGANISM FROM A SINGLE SET OF BLOOD CULTURES WHEN  MULTIPLE SETS ARE DRAWN IS UNCERTAIN. PLEASE NOTIFY THE MICROBIOLOGY DEPARTMENT WITHIN ONE WEEK IF SPECIATION AND SENSITIVITIES ARE REQUIRED. GRAM POSITIVE RODS CRITICAL RESULT CALLED TO, READ BACK BY AND VERIFIED WITH: PHARMD POWELL, L [id number] FCP    Report Status PENDING  Incomplete  Blood Culture ID Panel (Reflexed)     Status: Abnormal   Collection Time: 03/03/18  1:41 PM  Result Value Ref Range Status   Enterococcus species NOT DETECTED NOT DETECTED Final   Listeria monocytogenes NOT DETECTED NOT DETECTED Final   Staphylococcus species DETECTED (A) NOT DETECTED Final    Comment: Methicillin (oxacillin) resistant coagulase negative staphylococcus. Possible blood culture contaminant (unless isolated from more than one blood culture draw or clinical case suggests pathogenicity). No antibiotic treatment is indicated for blood  culture contaminants. CRITICAL RESULT CALLED TO, READ BACK BY AND VERIFIED WITH: Jene Every PHARMD 1840 03/04/18 A BROWNING    Staphylococcus aureus (BCID) NOT DETECTED NOT DETECTED Final   Methicillin resistance DETECTED (A) NOT DETECTED Final    Comment: CRITICAL RESULT CALLED TO, READ BACK BY AND VERIFIED WITH: Jene Every PHARMD 1840 03/04/18 A BROWNING    Streptococcus species NOT DETECTED NOT DETECTED Final   Streptococcus agalactiae NOT DETECTED NOT DETECTED Final   Streptococcus pneumoniae NOT DETECTED NOT DETECTED Final   Streptococcus pyogenes NOT DETECTED NOT DETECTED Final   Acinetobacter baumannii NOT DETECTED NOT DETECTED Final   Enterobacteriaceae species DETECTED (A) NOT DETECTED Final    Comment: Enterobacteriaceae represent a large family of gram-negative bacteria, not a single organism. CRITICAL RESULT CALLED TO, READ BACK BY AND VERIFIED WITH: Jene Every PHARMD 8938 03/04/18 A BROWNING    Enterobacter cloacae complex NOT DETECTED NOT DETECTED Final   Escherichia coli NOT DETECTED NOT DETECTED Final   Klebsiella oxytoca NOT DETECTED NOT DETECTED Final    Klebsiella pneumoniae NOT DETECTED NOT DETECTED Final   Proteus species DETECTED (A) NOT DETECTED Final    Comment: CRITICAL RESULT CALLED TO, READ BACK BY AND VERIFIED WITH: Jene Every PHARMD 1840 03/04/18 A BROWNING    Serratia marcescens NOT DETECTED NOT DETECTED Final   Carbapenem resistance NOT DETECTED NOT DETECTED Final   Haemophilus influenzae NOT DETECTED NOT DETECTED Final   Neisseria meningitidis  NOT DETECTED NOT DETECTED Final   Pseudomonas aeruginosa NOT DETECTED NOT DETECTED Final   Candida albicans NOT DETECTED NOT DETECTED Final   Candida glabrata NOT DETECTED NOT DETECTED Final   Candida krusei NOT DETECTED NOT DETECTED Final   Candida parapsilosis NOT DETECTED NOT DETECTED Final   Candida tropicalis NOT DETECTED NOT DETECTED Final    Comment: Performed at [facility], [place] [address], [place], [place] [address]     Studies: Ct Abdomen Pelvis Wo Contrast  Result Date: 03/08/2018 CLINICAL DATA:  Large ulcerated gastric fundal mass found on upper endoscopy today. EXAM: CT CHEST, ABDOMEN AND PELVIS WITHOUT CONTRAST TECHNIQUE: Multidetector CT imaging of the chest, abdomen and pelvis was performed following the standard protocol without IV contrast. COMPARISON:  CT chest 01/20/2018 FINDINGS: CT CHEST FINDINGS Cardiovascular: The heart size is normal. No substantial pericardial effusion. Coronary artery calcification is evident. Atherosclerotic calcification is noted in the wall of the thoracic aorta. Right Port-A-Cath tip is positioned in the distal SVC near the junction with the RA. Mediastinum/Nodes: 12 mm short axis prevascular node seen on 17/3. Scattered small central mediastinal lymph nodes identified in the upper mediastinum. 12 mm short axis retrotracheal node visible on 16/3. No subcarinal or lower paraesophageal lymphadenopathy. No evidence for gross hilar lymphadenopathy although assessment is limited by the lack of intravenous contrast on today's study. There is no  axillary lymphadenopathy. Lungs/Pleura: The central tracheobronchial airways are patent. 19 mm ring-like lesion in the right upper lobe is stable and may represent airway impaction with associated tree-in-bud nodularity. 6 mm medial right upper lobe nodule visible on 67/4. 5 mm right middle lobe nodule visible on 102/4, stable. 1.9 x 1.2 cm irregular opacity in the posterior left upper lobe is at the site of the 4.2 x 3.8 cm mass seen on PET-CT of 10/11/2015 and may reflect residual scarring. Small left pleural effusion evident. Musculoskeletal: No worrisome lytic or sclerotic osseous abnormality. CT ABDOMEN PELVIS FINDINGS Hepatobiliary: No focal abnormality in the liver on this study without intravenous contrast. Gallbladder surgically absent. No intrahepatic or extrahepatic biliary dilation. Pancreas: No focal mass lesion. No dilatation of the main duct. No intraparenchymal cyst. No peripancreatic edema. Spleen: Subtle 2.7 x 2.0 cm hypoattenuating lesion is identified in the dome of spleen, not definitely seen on prior. Adrenals/Urinary Tract: Right adrenal gland unremarkable. 1.6 cm left adrenal nodule stable since PET-CT of 10/01/2015. Cortical scarring noted in both kidneys. No evidence for hydroureter. The urinary bladder appears normal for the degree of distention. Stomach/Bowel: Tiny hiatal hernia noted. Wall thickening is noted in the esophagogastric junction and proximal stomach. Wall thickening extends along the medial aspect of the proximal stomach. Distal stomach unremarkable. Duodenum is normally positioned as is the ligament of Treitz. No small bowel wall thickening. No small bowel dilatation. The terminal ileum is normal. The appendix is not visualized, but there is no edema or inflammation in the region of the cecum. No gross colonic mass. No colonic wall thickening. Vascular/Lymphatic: There is abdominal aortic atherosclerosis without aneurysm. Bulky lymphadenopathy is seen in the hepato duodenal  ligament. 3.2 x 4.5 cm lymph node seen on 55/3. The 2.9 x 2.8 cm lymph node seen on 57/3. 10 mm short axis portal caval lymph node (63/3) is suspicious. No para-aortic retroperitoneal lymphadenopathy. No pelvic sidewall lymphadenopathy. Reproductive: The uterus is surgically absent. There is no adnexal mass. Other: No intraperitoneal free fluid. Musculoskeletal: No worrisome lytic or sclerotic osseous abnormality. IMPRESSION: 1. Marked wall thickening in the  esophagogastric junction and proximal stomach, consistent with known neoplasm. Bulky lymphadenopathy in the gastrohepatic ligament is compatible with metastatic disease. 10 mm short axis portal caval lymph node also suspicious. 2. 2.7 cm hypoattenuating lesion in the spleen. This is not well assessed on today's noncontrast exam. Close attention recommended as metastatic disease not excluded. 3. Scattered tiny pulmonary nodules measuring up to 5 mm. Nonspecific, but close attention recommended as metastatic disease not excluded. 4. Mild lymphadenopathy noted in the upper mediastinum. 5. Circular lesion right upper lobe on axial imaging has a branching configuration on coronal and sagittal images. This is probably airway impaction related to atypical infection. Close follow-up recommended. 6. 1.9 x 1.2 cm irregular lesion posterior left upper lobe at the site of a 4 cm mass on PET-CT of 10/11/2015. Imaging features today are compatible with residua of that lesion. 7. 1.6 cm left adrenal nodule stable since 10/11/2015, likely benign. 8.  Aortic Atherosclerois (ICD10-170.0) Electronically Signed   By: Misty Stanley M.D.   On: 03/08/2018 19:19   Ct Chest Wo Contrast  Result Date: 03/08/2018 CLINICAL DATA:  Large ulcerated gastric fundal mass found on upper endoscopy today. EXAM: CT CHEST, ABDOMEN AND PELVIS WITHOUT CONTRAST TECHNIQUE: Multidetector CT imaging of the chest, abdomen and pelvis was performed following the standard protocol without IV contrast.  COMPARISON:  CT chest 01/20/2018 FINDINGS: CT CHEST FINDINGS Cardiovascular: The heart size is normal. No substantial pericardial effusion. Coronary artery calcification is evident. Atherosclerotic calcification is noted in the wall of the thoracic aorta. Right Port-A-Cath tip is positioned in the distal SVC near the junction with the RA. Mediastinum/Nodes: 12 mm short axis prevascular node seen on 17/3. Scattered small central mediastinal lymph nodes identified in the upper mediastinum. 12 mm short axis retrotracheal node visible on 16/3. No subcarinal or lower paraesophageal lymphadenopathy. No evidence for gross hilar lymphadenopathy although assessment is limited by the lack of intravenous contrast on today's study. There is no axillary lymphadenopathy. Lungs/Pleura: The central tracheobronchial airways are patent. 19 mm ring-like lesion in the right upper lobe is stable and may represent airway impaction with associated tree-in-bud nodularity. 6 mm medial right upper lobe nodule visible on 67/4. 5 mm right middle lobe nodule visible on 102/4, stable. 1.9 x 1.2 cm irregular opacity in the posterior left upper lobe is at the site of the 4.2 x 3.8 cm mass seen on PET-CT of 10/11/2015 and may reflect residual scarring. Small left pleural effusion evident. Musculoskeletal: No worrisome lytic or sclerotic osseous abnormality. CT ABDOMEN PELVIS FINDINGS Hepatobiliary: No focal abnormality in the liver on this study without intravenous contrast. Gallbladder surgically absent. No intrahepatic or extrahepatic biliary dilation. Pancreas: No focal mass lesion. No dilatation of the main duct. No intraparenchymal cyst. No peripancreatic edema. Spleen: Subtle 2.7 x 2.0 cm hypoattenuating lesion is identified in the dome of spleen, not definitely seen on prior. Adrenals/Urinary Tract: Right adrenal gland unremarkable. 1.6 cm left adrenal nodule stable since PET-CT of 10/01/2015. Cortical scarring noted in both kidneys. No  evidence for hydroureter. The urinary bladder appears normal for the degree of distention. Stomach/Bowel: Tiny hiatal hernia noted. Wall thickening is noted in the esophagogastric junction and proximal stomach. Wall thickening extends along the medial aspect of the proximal stomach. Distal stomach unremarkable. Duodenum is normally positioned as is the ligament of Treitz. No small bowel wall thickening. No small bowel dilatation. The terminal ileum is normal. The appendix is not visualized, but there is no edema or inflammation in the region of  the cecum. No gross colonic mass. No colonic wall thickening. Vascular/Lymphatic: There is abdominal aortic atherosclerosis without aneurysm. Bulky lymphadenopathy is seen in the hepato duodenal ligament. 3.2 x 4.5 cm lymph node seen on 55/3. The 2.9 x 2.8 cm lymph node seen on 57/3. 10 mm short axis portal caval lymph node (63/3) is suspicious. No para-aortic retroperitoneal lymphadenopathy. No pelvic sidewall lymphadenopathy. Reproductive: The uterus is surgically absent. There is no adnexal mass. Other: No intraperitoneal free fluid. Musculoskeletal: No worrisome lytic or sclerotic osseous abnormality. IMPRESSION: 1. Marked wall thickening in the esophagogastric junction and proximal stomach, consistent with known neoplasm. Bulky lymphadenopathy in the gastrohepatic ligament is compatible with metastatic disease. 10 mm short axis portal caval lymph node also suspicious. 2. 2.7 cm hypoattenuating lesion in the spleen. This is not well assessed on today's noncontrast exam. Close attention recommended as metastatic disease not excluded. 3. Scattered tiny pulmonary nodules measuring up to 5 mm. Nonspecific, but close attention recommended as metastatic disease not excluded. 4. Mild lymphadenopathy noted in the upper mediastinum. 5. Circular lesion right upper lobe on axial imaging has a branching configuration on coronal and sagittal images. This is probably airway impaction  related to atypical infection. Close follow-up recommended. 6. 1.9 x 1.2 cm irregular lesion posterior left upper lobe at the site of a 4 cm mass on PET-CT of 10/11/2015. Imaging features today are compatible with residua of that lesion. 7. 1.6 cm left adrenal nodule stable since 10/11/2015, likely benign. 8.  Aortic Atherosclerois (ICD10-170.0) Electronically Signed   By: Misty Stanley M.D.   On: 03/08/2018 19:19    Scheduled Meds: .  stroke: mapping our early stages of recovery book   Does not apply Once  . sodium chloride  250 mL Intravenous Once  . aspirin  325 mg Oral Daily  . atorvastatin  20 mg Oral q1800  . buPROPion  75 mg Oral BID  . cephALEXin  250 mg Oral Q8H  . citalopram  40 mg Oral Daily  . famotidine  20 mg Oral Daily  . fentaNYL      . folic acid  1 mg Oral Daily  . gabapentin  600 mg Oral QHS  . levothyroxine  150 mcg Oral Q0600  . lidocaine      . loratadine  10 mg Oral Daily  . midazolam      . midodrine  10 mg Oral Daily  . montelukast  10 mg Oral QHS  . pilocarpine  5 mg Oral BID  . sodium chloride flush  3 mL Intravenous Once  . sucralfate  1 g Oral TID WC & HS  . vitamin B-12  1,000 mcg Oral Daily    Continuous Infusions:    Time spent: 48mns,  I have personally reviewed and interpreted on  03/09/2018 daily labs, tele strips, imagings as discussed above under date review session and assessment and plans.  I reviewed all nursing notes, pharmacy notes, consultant notes,  vitals, pertinent old records  I have discussed plan of care as described above with RN , patient and daughter at bedside on 03/09/2018   FFlorencia ReasonsMD, PhD  [facility] Pager [phone] If 7PM-7AM, please contact night-coverage at [email or web address], password [email or web address]2/23/2020, 10:28 AM  LOS: 6 days

## 2018-03-09 NOTE — Progress Notes (Signed)
IP PROGRESS NOTE  Subjective:   Nancy Norris feels reasonably well this morning without any complaints.  She had an IVC filter placement without any issues.  She denies any abdominal pain, hematochezia or melena.  She appeared comfortable without any complaints.  Objective:  Vital signs in last 24 hours: Temp:  [97.6 F (36.4 C)-99.2 F (37.3 C)] 97.6 F (36.4 C) (02/23 1011) Pulse Rate:  [62-75] 62 (02/23 1011) Resp:  [10-20] 20 (02/23 1011) BP: (105-147)/(63-76) 121/64 (02/23 1011) SpO2:  [93 %-100 %] 98 % (02/23 1011) Weight:  [173 lb 11.6 oz (78.8 kg)] 173 lb 11.6 oz (78.8 kg) (02/23 0351) Weight change:  Last BM Date: 03/09/18  Intake/Output from previous day: 02/22 0701 - 02/23 0700 In: 440 [P.O.:240; I.V.:200] Out: -  General: Alert, awake without distress. Head: Normocephalic atraumatic. Mouth: mucous membranes moist, pharynx normal without lesions Eyes: No scleral icterus.  Pupils are equal and round reactive to light. Resp: clear to auscultation bilaterally without rhonchi or wheezes or dullness to percussion. Cardio: regular rate and rhythm, S1, S2 normal, no murmur, click, rub or gallop GI: soft, non-tender; bowel sounds normal; no masses,  no organomegaly Musculoskeletal: No joint deformity or effusion. Neurological: No motor, sensory deficits.  Intact deep tendon reflexes. Skin: No rashes or lesions.  Portacath without erythema  Lab Results: Recent Labs    03/08/18 0532 03/09/18 0423  WBC 2.7* 3.3*  HGB 7.3* 7.2*  HCT 24.1* 23.5*  PLT 72* 67*    BMET Recent Labs    03/08/18 0532 03/09/18 0423  NA 140 137  K 3.6 3.6  CL 108 105  CO2 21* 23  GLUCOSE 95 102*  BUN 14 13  CREATININE 2.47* 2.34*  CALCIUM 8.2* 8.3*    Studies/Results: Ct Abdomen Pelvis Wo Contrast  Result Date: 03/08/2018 CLINICAL DATA:  Large ulcerated gastric fundal mass found on upper endoscopy today. EXAM: CT CHEST, ABDOMEN AND PELVIS WITHOUT CONTRAST TECHNIQUE:  Multidetector CT imaging of the chest, abdomen and pelvis was performed following the standard protocol without IV contrast. COMPARISON:  CT chest 01/20/2018 FINDINGS: CT CHEST FINDINGS Cardiovascular: The heart size is normal. No substantial pericardial effusion. Coronary artery calcification is evident. Atherosclerotic calcification is noted in the wall of the thoracic aorta. Right Port-A-Cath tip is positioned in the distal SVC near the junction with the RA. Mediastinum/Nodes: 12 mm short axis prevascular node seen on 17/3. Scattered small central mediastinal lymph nodes identified in the upper mediastinum. 12 mm short axis retrotracheal node visible on 16/3. No subcarinal or lower paraesophageal lymphadenopathy. No evidence for gross hilar lymphadenopathy although assessment is limited by the lack of intravenous contrast on today's study. There is no axillary lymphadenopathy. Lungs/Pleura: The central tracheobronchial airways are patent. 19 mm ring-like lesion in the right upper lobe is stable and may represent airway impaction with associated tree-in-bud nodularity. 6 mm medial right upper lobe nodule visible on 67/4. 5 mm right middle lobe nodule visible on 102/4, stable. 1.9 x 1.2 cm irregular opacity in the posterior left upper lobe is at the site of the 4.2 x 3.8 cm mass seen on PET-CT of 10/11/2015 and may reflect residual scarring. Small left pleural effusion evident. Musculoskeletal: No worrisome lytic or sclerotic osseous abnormality. CT ABDOMEN PELVIS FINDINGS Hepatobiliary: No focal abnormality in the liver on this study without intravenous contrast. Gallbladder surgically absent. No intrahepatic or extrahepatic biliary dilation. Pancreas: No focal mass lesion. No dilatation of the main duct. No intraparenchymal cyst. No peripancreatic edema. Spleen:  Subtle 2.7 x 2.0 cm hypoattenuating lesion is identified in the dome of spleen, not definitely seen on prior. Adrenals/Urinary Tract: Right adrenal gland  unremarkable. 1.6 cm left adrenal nodule stable since PET-CT of 10/01/2015. Cortical scarring noted in both kidneys. No evidence for hydroureter. The urinary bladder appears normal for the degree of distention. Stomach/Bowel: Tiny hiatal hernia noted. Wall thickening is noted in the esophagogastric junction and proximal stomach. Wall thickening extends along the medial aspect of the proximal stomach. Distal stomach unremarkable. Duodenum is normally positioned as is the ligament of Treitz. No small bowel wall thickening. No small bowel dilatation. The terminal ileum is normal. The appendix is not visualized, but there is no edema or inflammation in the region of the cecum. No gross colonic mass. No colonic wall thickening. Vascular/Lymphatic: There is abdominal aortic atherosclerosis without aneurysm. Bulky lymphadenopathy is seen in the hepato duodenal ligament. 3.2 x 4.5 cm lymph node seen on 55/3. The 2.9 x 2.8 cm lymph node seen on 57/3. 10 mm short axis portal caval lymph node (63/3) is suspicious. No para-aortic retroperitoneal lymphadenopathy. No pelvic sidewall lymphadenopathy. Reproductive: The uterus is surgically absent. There is no adnexal mass. Other: No intraperitoneal free fluid. Musculoskeletal: No worrisome lytic or sclerotic osseous abnormality. IMPRESSION: 1. Marked wall thickening in the esophagogastric junction and proximal stomach, consistent with known neoplasm. Bulky lymphadenopathy in the gastrohepatic ligament is compatible with metastatic disease. 10 mm short axis portal caval lymph node also suspicious. 2. 2.7 cm hypoattenuating lesion in the spleen. This is not well assessed on today's noncontrast exam. Close attention recommended as metastatic disease not excluded. 3. Scattered tiny pulmonary nodules measuring up to 5 mm. Nonspecific, but close attention recommended as metastatic disease not excluded. 4. Mild lymphadenopathy noted in the upper mediastinum. 5. Circular lesion right upper  lobe on axial imaging has a branching configuration on coronal and sagittal images. This is probably airway impaction related to atypical infection. Close follow-up recommended. 6. 1.9 x 1.2 cm irregular lesion posterior left upper lobe at the site of a 4 cm mass on PET-CT of 10/11/2015. Imaging features today are compatible with residua of that lesion. 7. 1.6 cm left adrenal nodule stable since 10/11/2015, likely benign. 8.  Aortic Atherosclerois (ICD10-170.0) Electronically Signed   By: Misty Stanley M.D.   On: 03/08/2018 19:19    Assessment/Plan:  78 year old woman with the following:   1.  Gastric mass: Status post endoscopy with pathology is pending but suspicious for malignancy.  CT scan results obtained on February 22 of 2020 were reviewed personally and discussed with the patient.  She does have abdominal adenopathy and likely represents locally advanced possibly metastatic malignancy.  Management of this malignancy will be done after discussion in multidisciplinary approach.  Likely she will require upfront chemotherapy or chemoradiation.  Further management is per Dr. Worthy Flank recommendations.    2.  Deep vein thrombosis: IVC filter placed without any issues.  Defer full dose anticoagulation till her gastric tumor is treated or no active bleeding is noted.  3.  Multiple myeloma: Treatment currently on hold while she is hospitalized.  This might be deferred to a later date given her recent gastric malignancy.   Dr. Julien Nordmann will evaluate the patient on Monday.    15 minutes was spent with the patient face-to-face today.  More than 50% of time was dedicated to updating the patient on her clinical status, review of her imaging studies and future plan of care.    LOS: 6 days  Nancy Norris 03/09/2018, 11:57 AM

## 2018-03-09 NOTE — Plan of Care (Signed)
  Problem: Education: Goal: Knowledge of patient specific risk factors addressed and post discharge goals established will improve Outcome: Progressing   Problem: Education: Goal: Knowledge of disease or condition will improve Outcome: Progressing

## 2018-03-09 NOTE — Progress Notes (Signed)
Patient remained NPO for green filter.  PRN medication for h/a with some effect as verbalized by pt.

## 2018-03-09 NOTE — Procedures (Signed)
IVC filter placement [place] L2 placement No venous anomaly EBL 0 Comp 0

## 2018-03-09 NOTE — Progress Notes (Signed)
[facility] Progress Note  TOPAZ RAGLIN 78 y.o. [birth year]   Subjective: Feels ok. Denies abd pain.  Objective: Vital signs: Vitals:   03/09/18 1011 03/09/18 1242  BP: 121/64 119/82  Pulse: 62 74  Resp: 20 18  Temp: 97.6 F (36.4 C) 98 F (36.7 C)  SpO2: 98% 98%    Physical Exam: Gen: alert, no acute distress, elderly HEENT: anicteric sclera CV: RRR Chest: CTA B Abd: soft, nontender, nondistended, +BS  Lab Results: Recent Labs    03/08/18 0532 03/09/18 0423  NA 140 137  K 3.6 3.6  CL 108 105  CO2 21* 23  GLUCOSE 95 102*  BUN 14 13  CREATININE 2.47* 2.34*  CALCIUM 8.2* 8.3*   No results for input(s): AST, ALT, ALKPHOS, BILITOT, PROT, ALBUMIN in the last 72 hours. Recent Labs    03/08/18 0532 03/09/18 0423  WBC 2.7* 3.3*  NEUTROABS 1.2* 1.6*  HGB 7.3* 7.2*  HCT 24.1* 23.5*  MCV 103.4* 103.1*  PLT 72* 67*      Assessment/Plan: Proximal malignant-appearing gastric mass - path pending. Tolerating clear liquids and ok to change to full liquids. Would not advance beyond that today due to mass in proximal stomach and resulting dysphagia from it. Non-contrast CT showed lymphadenopathy at the GEJ annd tiny nonspecific pulmonary nodules. S/P IVC filter today due to acute DVT and high risk of bleeding from the gastric mass if anticoagulation is started. Await path results and then will need a surgical consult and further f/u with oncology for the gastric mass. Pancytopenia with a history of multiple myeloma followed by Dr. Julien Nordmann.  Acute CVA being managed by neurology and hospitalist. Dr. Therisa Doyne will f/u this week when path results are complete.   Lear Ng 03/09/2018, 4:26 PM  Questions please call [phone] ID: Evie Lacks, female   DOB: 29-Jan-1940, 78 y.o.   MRN: [id number]

## 2018-03-10 ENCOUNTER — Other Ambulatory Visit: Payer: Self-pay

## 2018-03-10 ENCOUNTER — Encounter (HOSPITAL_COMMUNITY): Payer: Self-pay | Admitting: Gastroenterology

## 2018-03-10 ENCOUNTER — Inpatient Hospital Stay (HOSPITAL_COMMUNITY): Payer: Medicare Other

## 2018-03-10 DIAGNOSIS — R7989 Other specified abnormal findings of blood chemistry: Secondary | ICD-10-CM

## 2018-03-10 LAB — ECHOCARDIOGRAM LIMITED
Height: 65 in
Weight: 2779.56 oz

## 2018-03-10 LAB — PREPARE RBC (CROSSMATCH)

## 2018-03-10 LAB — VISCOSITY, SERUM: Viscosity, Serum: 1.4 rel.saline — ABNORMAL LOW (ref 1.6–1.9)

## 2018-03-10 LAB — MAGNESIUM: Magnesium: 2.2 mg/dL (ref 1.7–2.4)

## 2018-03-10 LAB — TROPONIN I
Troponin I: 2.06 ng/mL (ref <0.03)
Troponin I: 2.43 ng/mL (ref <0.03)

## 2018-03-10 MED ORDER — LACTATED RINGERS IV BOLUS
1000.0000 mL | Freq: Once | INTRAVENOUS | Status: AC
  Administered 2018-03-10: 1000 mL via INTRAVENOUS

## 2018-03-10 MED ORDER — DIPHENHYDRAMINE HCL 25 MG PO CAPS
25.0000 mg | ORAL_CAPSULE | Freq: Once | ORAL | Status: AC
  Administered 2018-03-10: 25 mg via ORAL
  Filled 2018-03-10: qty 1

## 2018-03-10 MED ORDER — PANTOPRAZOLE SODIUM 40 MG PO TBEC
40.0000 mg | DELAYED_RELEASE_TABLET | Freq: Every day | ORAL | Status: DC
  Administered 2018-03-10 – 2018-03-11 (×2): 40 mg via ORAL
  Filled 2018-03-10 (×2): qty 1

## 2018-03-10 MED ORDER — LACTATED RINGERS IV SOLN
INTRAVENOUS | Status: DC
  Administered 2018-03-10 (×2): via INTRAVENOUS

## 2018-03-10 MED ORDER — SODIUM CHLORIDE 0.9% IV SOLUTION
Freq: Once | INTRAVENOUS | Status: AC
  Administered 2018-03-10: 20:00:00 via INTRAVENOUS

## 2018-03-10 MED ORDER — ENSURE ENLIVE PO LIQD
237.0000 mL | Freq: Two times a day (BID) | ORAL | Status: DC
  Administered 2018-03-10 – 2018-03-11 (×3): 237 mL via ORAL

## 2018-03-10 MED ORDER — ONDANSETRON HCL 4 MG/2ML IJ SOLN
4.0000 mg | Freq: Four times a day (QID) | INTRAMUSCULAR | Status: DC | PRN
  Administered 2018-03-11: 4 mg via INTRAVENOUS
  Filled 2018-03-10: qty 2

## 2018-03-10 NOTE — Progress Notes (Signed)
Inpatient Rehabilitation Admissions Coordinator  Met with patient at bedside. Noted medical workup ongoing. I will follow up tomorrow.  Danne Baxter, RN, MSN Rehab Admissions Coordinator [phone] 03/10/2018 10:22 AM

## 2018-03-10 NOTE — Progress Notes (Signed)
Physical Therapy Treatment Patient Details Name: Nancy Norris MRN: [id number] DOB: 23-Jul-1940 Today's Date: 03/10/2018    History of Present Illness Patient is a 78 y/o female presents with confusion, amnesia and HA. Head CT- left occipital and left frontal parietial infarcts. MRI- multiple posterior/anterior bilateral infarcts. s/p TEE, s/p EGD with possible malignant gastric mass, s/p IVC filter placement secondary to DVT LLE 2/23. PMH includes multiple myeloma currently getting chemo, MI, COPD, hypotension, breast ca, fibromyalgia.     PT Comments    Patient with episode of dizziness, pallor, chest discomfort and drop in BP limiting session today. RN notified and present in room post session to assess. BP pre session 111/71, post session 88/62, HR stable in 70-80s and Sp02 >94% on RA. Took pt 36 seconds to perform cognitive TUG (>15 sec is high fall risk) with notable balance deficits. Pt not able to count down from 50 by 3s or 5s during functional task or walking. Able to recall 2/3 words during memory recall test during session using word association. Pt also reports weakness in left hand and difficulty performing fine motor tasks. Great CIR candidate. Will continue to follow and progress as tolerated.   Follow Up Recommendations  CIR;Supervision for mobility/OOB     Equipment Recommendations  None recommended by PT    Recommendations for Other Services       Precautions / Restrictions Precautions Precautions: Fall Precaution Comments: Rt field cut Restrictions Weight Bearing Restrictions: No    Mobility  Bed Mobility Overal bed mobility: Needs Assistance Bed Mobility: Supine to Sit;Sit to Supine     Supine to sit: Supervision;HOB elevated Sit to supine: Supervision;HOB elevated   General bed mobility comments: No assist needed, use of bed rails. No dizziness.   Transfers Overall transfer level: Needs assistance Equipment used: None Transfers: Sit to/from Stand Sit  to Stand: Min guard         General transfer comment: min guard for safety; stood from EOB x1, from toilet x1. + dizziness and reported tiredness getting back from bathroom. + pallor; BP 111/71  Ambulation/Gait Ambulation/Gait assistance: Min assist Gait Distance (Feet): 20 Feet(x3 bouts) Assistive device: None(counter in room) Gait Pattern/deviations: Step-through pattern;Decreased stride length;Drifts right/left Gait velocity: decreased   General Gait Details: Slow, mildly unsteady gait esp with dual tasking and performing counting tasks. Not able to walk and problem solve- slow gait with LOB. 2 seated rest breaks due to dizziness, feeling tired. Bp 88/62, Sp02 94% with reports of chest discomfort. RN notified.    Stairs             Wheelchair Mobility    Modified Rankin (Stroke Patients Only) Modified Rankin (Stroke Patients Only) Pre-Morbid Rankin Score: Slight disability Modified Rankin: Moderately severe disability     Balance Overall balance assessment: Needs assistance Sitting-balance support: Feet supported;No upper extremity supported Sitting balance-Leahy Scale: Good     Standing balance support: During functional activity Standing balance-Leahy Scale: Fair Standing balance comment: Able to wash hands at sink without LOB.                 Standardized Balance Assessment Standardized Balance Assessment : TUG: Timed Up and Go Test     Timed Up and Go Test Cognitive TUG (seconds): 36    Cognition Arousal/Alertness: Awake/alert Behavior During Therapy: WFL for tasks assessed/performed Overall Cognitive Status: Impaired/Different from baseline Area of Impairment: Awareness;Problem solving  Memory: Decreased short-term memory     Awareness: Emergent Problem Solving: Slow processing;Requires verbal cues General Comments: Impaired memory, able to recall 2/3 words for short term recall by using word associations. Not  able to dual task- subtract 5 or count down numbers during functional mobility. See cognitive TUG results      Exercises      General Comments General comments (skin integrity, edema, etc.): Reports vision seems to be improved- not noticing shadow in right lower quadrant anymore, not sure if this is improvement or just acclimation? Also of note, reports dweakness of left hand esp performing fine motor tasks- squeezing out lotion etc.       Pertinent Vitals/Pain Pain Assessment: Faces Faces Pain Scale: Hurts even more Pain Location: headache Pain Descriptors / Indicators: Headache Pain Intervention(s): Monitored during session;Limited activity within patient's tolerance    Home Living                      Prior Function            PT Goals (current goals can now be found in the care plan section) Progress towards PT goals: Not progressing toward goals - comment(secondary to episode of drop in BP, dizziness and chest discomfort)    Frequency    Min 4X/week      PT Plan Current plan remains appropriate    Co-evaluation              AM-PAC PT "6 Clicks" Mobility   Outcome Measure  Help needed turning from your back to your side while in a flat bed without using bedrails?: None Help needed moving from lying on your back to sitting on the side of a flat bed without using bedrails?: None Help needed moving to and from a bed to a chair (including a wheelchair)?: A Little Help needed standing up from a chair using your arms (e.g., wheelchair or bedside chair)?: A Little Help needed to walk in hospital room?: A Little Help needed climbing 3-5 steps with a railing? : A Little 6 Click Score: 3    End of Session Equipment Utilized During Treatment: Gait belt Activity Tolerance: Treatment limited secondary to medical complications (Comment)(drop in BP and dizziness) Patient left: in bed;with call bell/phone within reach;with bed alarm set;with nursing/sitter in  room Nurse Communication: Mobility status;Other (comment)(drop in BP, chest discomfort) PT Visit Diagnosis: Difficulty in walking, not elsewhere classified (R26.2);Unsteadiness on feet (R26.81)     Time: 9311-2162 PT Time Calculation (min) (ACUTE ONLY): 33 min  Charges:  $Therapeutic Activity: 8-22 mins $Neuromuscular Re-education: 8-22 mins                     Wray Kearns, PT, DPT Acute Rehabilitation Services Pager [phone] Office [phone]       Marguarite Arbour A Sabra Heck 03/10/2018, 9:13 AM

## 2018-03-10 NOTE — Progress Notes (Signed)
  Echocardiogram 2D Echocardiogram limited has been performed.  Darlina Sicilian M 03/10/2018, 4:30 PM

## 2018-03-10 NOTE — Consult Note (Addendum)
Cardiology Consult    Patient ID: Nancy Norris MRN: [id number], DOB/AGE: [birth year]   Admit date: 03/03/2018 Date of Consult: 03/10/2018  Primary Physician: [name], MD Primary Cardiologist: Jenkins Rouge, MD Requesting Provider: Florencia Reasons, MD  Patient Profile    Nancy Norris is a 78 y.o. female with a history of multiple myeloma with pancytopenia, postural hypotension, fibromyalgia, COPD, and CKD stage IV followed by outpatient Nephrology, who is being seen today for the evaluation of elevated troponin at the request of Dr. Erlinda Hong.  History of Present Illness    Nancy Norris is a 77 year old females with a history of multiple myeloma with pancytopenia, postural hypotension, fibromyalgia, COPD, and CKD stage IV, who was admitted on 03/03/2018 for an acute stroke after presenting with altered mental status. MRI showed multifocal bilateral acute ischemic within multiple vascular territories. Patient also reported worsening dysphagia over the last 9 months so modified barium swallow was ordered which showed irregularity in the distal esophagus concerning for ulceration vs possible tumor. EGD then showed large fungating malignant-appearing ulcerated proximal stomach mass with area of blood consistent with recent bleeding. Biopsies were taken. TEE on 03/07/2018 showed LVEF of 60-65% with small patent foramen ovale with predominantly left to right shunting across the atrial septum. Lower extremity dopplers showed acute DVT of the left posterior tibial vein and left peroneal vein. Unable to anticoagulate due to high risk for bleed so IVC filter was placed.  Patient was ambulating today with PT when she developed dizziness and chest tightness. Symptoms resolved when patient sat down. She was found to be orthostatic with BP dropping from 120/61 while lying down to 76/40 with standing. Troponin was ordered and found to be elevated at 2.06. Cardiology was consulted for further evaluation. Patient reports  she developed dizziness, lightheadedness, weakness, and substernal non-radiating chest tightness with associated nausea and shortness of breath while performing PT exercises. Chest discomfort quickly resolved after a couple of minutes after patient patient sat down; however, lightheadedness and dizziness persisted for a while. Patient was started IV fluids and symptoms have resolved. Patient does report an episode of mild exertional chest pain a few weeks prior to be admitted but states it was not bad. She does note some possible stable orthopnea and PND but denies any lower extremities swelling. Per chart review, patient had 2 normal stress test in 2015. She has not had any ischemic work-up since that time.   Currently, patient is completely asymptomatic after receiving IV fluids.  Past Medical History   Past Medical History:  Diagnosis Date  . Anemia   . Anginal pain ([place])    used NTG x 2 May 31 and 06/15/13   . Anxiety   . Arthritis   . B12 deficiency 12/04/2014  . Breast cancer ([place])   . Complication of anesthesia   . COPD (chronic obstructive pulmonary disease) ([place])   . 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 Depression   . Dizziness   . Dyspnea   . Fibromyalgia   . Fibromyalgia   . GERD (gastroesophageal reflux disease)   . Headache(784.0)   . Heart murmur   . Hemoptysis 10/21/2015  . History of blood transfusion    last one May 12   . Hx of cardiovascular stress test    LexiScan with low level exercise Myoview (02/2013): No ischemia, EF 72%; normal study  . Hx of echocardiogram    a.  Echocardiogram (12/26/2012): EF 84-53%, grade 1 diastolic dysfunction;   b.  Echocardiogram (02/2013): EF 55-60%, no WMA, trivial effusion  . Hyperkalemia   . Hyponatremia   . Hypotension   . Hypothyroidism   . Mucositis   . Multiple myeloma   . Myocardial infarction [facility])    in past, patient was unaware.   . Neuropathy   . Nodule of left lung 09/13/2015  . Pain in joint, pelvic region  and thigh 07/07/2015  . Pneumonia    several  . PONV (postoperative nausea and vomiting) 2008   after mastestomy    Past Surgical History:  Procedure Laterality Date  . ABDOMINAL HYSTERECTOMY  1981  . AV FISTULA PLACEMENT Left 06/19/2013   Procedure: CREATION OF LEFT ARM ARTERIOVENOUS (AV) FISTULA ;  Surgeon: Angelia Mould, MD;  Location: [place];  Service: Vascular;  Laterality: Left;  . BIOPSY  03/08/2018   Procedure: BIOPSY;  Surgeon: Wilford Corner, MD;  Location: [place];  Service: Endoscopy;;  . BREAST RECONSTRUCTION    . BREAST SURGERY Right    reduction  . CATARACT EXTRACTION, BILATERAL    . CHOLECYSTECTOMY  1971  . COLONOSCOPY    . ESOPHAGOGASTRODUODENOSCOPY (EGD) WITH PROPOFOL N/A 03/08/2018   Procedure: ESOPHAGOGASTRODUODENOSCOPY (EGD) WITH PROPOFOL;  Surgeon: Wilford Corner, MD;  Location: [place];  Service: Endoscopy;  Laterality: N/A;  . EYE SURGERY Bilateral    lens implant  . history of Port removal    . IR IVC FILTER PLMT / S&I /IMG GUID/MOD SED  03/09/2018  . LUNG BIOPSY  10/21/2015  . MASTECTOMY Left 2008  . PORTACATH PLACEMENT  12/2012   has had 2  . RESECTION OF ARTERIOVENOUS FISTULA ANEURYSM Left 06/04/2017   Procedure: LIGATION ANEURYSM OF LEFT ARTERIOVENOUS FISTULA;  Surgeon: Angelia Mould, MD;  Location: [place];  Service: Vascular;  Laterality: Left;  . Status post stem cell transplant on September 28, 2008.    . TEE WITHOUT CARDIOVERSION N/A 03/07/2018   Procedure: TRANSESOPHAGEAL ECHOCARDIOGRAM (TEE);  Surgeon: Buford Dresser, MD;  Location: [facility] ENDOSCOPY;  Service: Cardiovascular;  Laterality: N/A;     Allergies  Allergies  Allergen Reactions  . Codeine Anaphylaxis    anaphylasix tolarates oxycodone per care cast  . Latex Shortness Of Breath  . Chocolate     migraines  . Onion Other (See Comments)    Causes migraine headaches  . Zyprexa [Olanzapine] Other (See Comments)    Confusion , dizzy,unsteady  . Adhesive  [Tape] Other (See Comments)    blisters  . Heparin Rash    Can flush port with heparin  . Hydrocodone Rash    With extreme itching  . Iodinated Diagnostic Agents Hives, Itching, Rash and Other (See Comments)    Happened 60 years ago Stage IV kidney function  . Oxycodone Rash  . Promethazine-Dm Hives  . Sulfa Antibiotics Itching    Inpatient Medications    .  stroke: mapping our early stages of recovery book   Does not apply Once  . sodium chloride  250 mL Intravenous Once  . sodium chloride   Intravenous Once  . aspirin  325 mg Oral Daily  . atorvastatin  20 mg Oral q1800  . buPROPion  75 mg Oral BID  . cephALEXin  250 mg Oral Q8H  . citalopram  40 mg Oral Daily  . diphenhydrAMINE  25 mg Oral Once  . feeding supplement (ENSURE ENLIVE)  237 mL Oral BID BM  . folic acid  1 mg Oral Daily  . gabapentin  600 mg Oral QHS  .  levothyroxine  150 mcg Oral Q0600  . loratadine  10 mg Oral Daily  . midodrine  10 mg Oral Daily  . montelukast  10 mg Oral QHS  . pantoprazole  40 mg Oral Daily  . pilocarpine  5 mg Oral BID  . sodium chloride flush  3 mL Intravenous Once  . sucralfate  1 g Oral TID WC & HS  . vitamin B-12  1,000 mcg Oral Daily    Family History    Family History  Problem Relation Age of Onset  . Arthritis Mother   . Asthma Mother   . Cancer Sister   . Hyperlipidemia Brother    She indicated that her mother is alive. She reported the following about her father: unknown. She indicated that the status of her sister is unknown. She indicated that the status of her brother is unknown.   Social History    Social History   Socioeconomic History  . Marital status: Single    Spouse name: Not on file  . Number of children: 1  . Years of education: Not on file  . Highest education level: Not on file  Occupational History  . Occupation: retired-sect.Licensed conveyancer)  Social Needs  . Financial resource strain: Not on file  . Food insecurity:    Worry: Not on file     Inability: Not on file  . Transportation needs:    Medical: Yes    Non-medical: No  Tobacco Use  . Smoking status: Former Smoker    Packs/day: 1.00    Years: 30.00    Pack years: 30.00    Types: Cigarettes    Last attempt to quit: 02/15/2005    Years since quitting: 13.0  . Smokeless tobacco: Never Used  Substance and Sexual Activity  . Alcohol use: No  . Drug use: No  . Sexual activity: Never  Lifestyle  . Physical activity:    Days per week: Not on file    Minutes per session: Not on file  . Stress: Not on file  Relationships  . Social connections:    Talks on phone: Not on file    Gets together: Not on file    Attends religious service: Not on file    Active member of club or organization: Not on file    Attends meetings of clubs or organizations: Not on file    Relationship status: Not on file  . Intimate partner violence:    Fear of current or ex partner: Not on file    Emotionally abused: Not on file    Physically abused: Not on file    Forced sexual activity: Not on file  Other Topics Concern  . Not on file  Social History Narrative  . Not on file     Review of Systems    Review of Systems  Constitutional: Positive for fever. Negative for chills and diaphoresis.  HENT: Negative for congestion and sore throat.   Respiratory: Positive for shortness of breath. Negative for cough.   Cardiovascular: Positive for chest pain, palpitations, orthopnea and PND. Negative for leg swelling.  Gastrointestinal: Positive for nausea. Negative for blood in stool and vomiting.  Genitourinary: Negative for hematuria.  Musculoskeletal: Negative for falls and myalgias.  Neurological: Positive for dizziness. Negative for tingling and loss of consciousness.  Endo/Heme/Allergies: Does not bruise/bleed easily.  Psychiatric/Behavioral: Negative for substance abuse (former tobacco use).    Physical Exam    Blood pressure 138/81, pulse 66, temperature 98.4 F (36.9 C),  temperature  source Oral, resp. rate 16, height '5\' 5"'$  (1.651 m), weight 78.8 kg, SpO2 98 %.  General: 78 y.o. female resting comfortably in no acute distress. Pleasant and cooperative. HEENT: Normal  Neck: Supple. No carotid bruits or JVD appreciated. Lungs: No increased work of breathing. Clear to auscultation bilaterally. No wheezes, rhonchi, or rales. Heart: RRR. Distinct S1 and S2. No murmurs, gallops, or rubs.  Abdomen: Soft, non-distended, and non-tender to palpation. Bowel sounds present. Extremities: No significant lower extremity edema.Radials 2+ and equal bilaterally. Skin: Warm and dry.  Neuro: Alert and oriented x3. No focal deficits. Moves all extremities spontaneously. Psych: Normal affect.  Labs    Troponin (Point of Care Test) No results for input(s): TROPIPOC in the last 72 hours. Recent Labs    03/10/18 1123  TROPONINI 2.06*   Lab Results  Component Value Date   WBC 3.3 (L) 03/09/2018   HGB 7.2 (L) 03/09/2018   HCT 23.5 (L) 03/09/2018   MCV 103.1 (H) 03/09/2018   PLT 67 (L) 03/09/2018    Recent Labs  Lab 03/04/18 0500  03/09/18 0423  NA 141   < > 137  K 4.1   < > 3.6  CL 112*   < > 105  CO2 22   < > 23  BUN 17   < > 13  CREATININE 2.52*   < > 2.34*  CALCIUM 8.6*   < > 8.3*  PROT 4.6*  --   --   BILITOT 0.4  --   --   ALKPHOS 61  --   --   ALT 9  --   --   AST 15  --   --   GLUCOSE 107*   < > 102*   < > = values in this interval not displayed.   Lab Results  Component Value Date   CHOL 141 03/06/2018   HDL 26 (L) 03/06/2018   LDLCALC 85 03/06/2018   TRIG 151 (H) 03/06/2018   Lab Results  Component Value Date   DDIMER 0.59 (H) 10/20/2015     Radiology Studies    Ct Abdomen Pelvis Wo Contrast  Result Date: 03/08/2018 CLINICAL DATA:  Large ulcerated gastric fundal mass found on upper endoscopy today. EXAM: CT CHEST, ABDOMEN AND PELVIS WITHOUT CONTRAST TECHNIQUE: Multidetector CT imaging of the chest, abdomen and pelvis was performed following the  standard protocol without IV contrast. COMPARISON:  CT chest 01/20/2018 FINDINGS: CT CHEST FINDINGS Cardiovascular: The heart size is normal. No substantial pericardial effusion. Coronary artery calcification is evident. Atherosclerotic calcification is noted in the wall of the thoracic aorta. Right Port-A-Cath tip is positioned in the distal SVC near the junction with the RA. Mediastinum/Nodes: 12 mm short axis prevascular node seen on 17/3. Scattered small central mediastinal lymph nodes identified in the upper mediastinum. 12 mm short axis retrotracheal node visible on 16/3. No subcarinal or lower paraesophageal lymphadenopathy. No evidence for gross hilar lymphadenopathy although assessment is limited by the lack of intravenous contrast on today's study. There is no axillary lymphadenopathy. Lungs/Pleura: The central tracheobronchial airways are patent. 19 mm ring-like lesion in the right upper lobe is stable and may represent airway impaction with associated tree-in-bud nodularity. 6 mm medial right upper lobe nodule visible on 67/4. 5 mm right middle lobe nodule visible on 102/4, stable. 1.9 x 1.2 cm irregular opacity in the posterior left upper lobe is at the site of the 4.2 x 3.8 cm mass seen on PET-CT of 10/11/2015 and may  reflect residual scarring. Small left pleural effusion evident. Musculoskeletal: No worrisome lytic or sclerotic osseous abnormality. CT ABDOMEN PELVIS FINDINGS Hepatobiliary: No focal abnormality in the liver on this study without intravenous contrast. Gallbladder surgically absent. No intrahepatic or extrahepatic biliary dilation. Pancreas: No focal mass lesion. No dilatation of the main duct. No intraparenchymal cyst. No peripancreatic edema. Spleen: Subtle 2.7 x 2.0 cm hypoattenuating lesion is identified in the dome of spleen, not definitely seen on prior. Adrenals/Urinary Tract: Right adrenal gland unremarkable. 1.6 cm left adrenal nodule stable since PET-CT of 10/01/2015. Cortical  scarring noted in both kidneys. No evidence for hydroureter. The urinary bladder appears normal for the degree of distention. Stomach/Bowel: Tiny hiatal hernia noted. Wall thickening is noted in the esophagogastric junction and proximal stomach. Wall thickening extends along the medial aspect of the proximal stomach. Distal stomach unremarkable. Duodenum is normally positioned as is the ligament of Treitz. No small bowel wall thickening. No small bowel dilatation. The terminal ileum is normal. The appendix is not visualized, but there is no edema or inflammation in the region of the cecum. No gross colonic mass. No colonic wall thickening. Vascular/Lymphatic: There is abdominal aortic atherosclerosis without aneurysm. Bulky lymphadenopathy is seen in the hepato duodenal ligament. 3.2 x 4.5 cm lymph node seen on 55/3. The 2.9 x 2.8 cm lymph node seen on 57/3. 10 mm short axis portal caval lymph node (63/3) is suspicious. No para-aortic retroperitoneal lymphadenopathy. No pelvic sidewall lymphadenopathy. Reproductive: The uterus is surgically absent. There is no adnexal mass. Other: No intraperitoneal free fluid. Musculoskeletal: No worrisome lytic or sclerotic osseous abnormality. IMPRESSION: 1. Marked wall thickening in the esophagogastric junction and proximal stomach, consistent with known neoplasm. Bulky lymphadenopathy in the gastrohepatic ligament is compatible with metastatic disease. 10 mm short axis portal caval lymph node also suspicious. 2. 2.7 cm hypoattenuating lesion in the spleen. This is not well assessed on today's noncontrast exam. Close attention recommended as metastatic disease not excluded. 3. Scattered tiny pulmonary nodules measuring up to 5 mm. Nonspecific, but close attention recommended as metastatic disease not excluded. 4. Mild lymphadenopathy noted in the upper mediastinum. 5. Circular lesion right upper lobe on axial imaging has a branching configuration on coronal and sagittal images.  This is probably airway impaction related to atypical infection. Close follow-up recommended. 6. 1.9 x 1.2 cm irregular lesion posterior left upper lobe at the site of a 4 cm mass on PET-CT of 10/11/2015. Imaging features today are compatible with residua of that lesion. 7. 1.6 cm left adrenal nodule stable since 10/11/2015, likely benign. 8.  Aortic Atherosclerois (ICD10-170.0) Electronically Signed   By: Misty Stanley M.D.   On: 03/08/2018 19:19   Dg Chest 2 View  Result Date: 03/03/2018 CLINICAL DATA:  Weakness, dizziness and confusion over the last 2 weeks. History of renal cell cancer and myeloma. EXAM: CHEST - 2 VIEW COMPARISON:  02/26/2018 radiography.  CT 01/20/2018. FINDINGS: Heart size is normal. Chronic aortic atherosclerosis. Power port inserted from a right jugular approach has its tip at the SVC RA junction. The lungs are clear except for mild scarring. No sign of active infiltrate, mass, effusion or collapse. IMPRESSION: No active cardiopulmonary disease. Electronically Signed   By: Nelson Chimes M.D.   On: 03/03/2018 12:58   Ct Head Wo Contrast  Result Date: 03/05/2018 CLINICAL DATA:  Headache beginning this afternoon. History of multiple myeloma, thrombocytopenia and stroke. EXAM: CT HEAD WITHOUT CONTRAST TECHNIQUE: Contiguous axial images were obtained from the base of the  skull through the vertex without intravenous contrast. COMPARISON:  MRI of the head March 03, 2018 and CT HEAD March 03, 2018. FINDINGS: BRAIN: Patchy LEFT > RIGHT parietal and LEFT occipital hypodensities are increasingly conspicuous from prior CT. No propagation. No intraparenchymal hemorrhage, mass effect or midline shift. Patchy LEFT frontal white matter hypodensities most compatible with chronic small vessel ischemic changes. Old small LEFT cerebellar infarct. No parenchymal brain volume loss for age. No hydrocephalus. No abnormal extra-axial fluid collections. VASCULAR: Moderate calcific atherosclerosis of the  carotid siphons. SKULL: No skull fracture. Moderate temporomandibular osteoarthrosis. No significant scalp soft tissue swelling. SINUSES/ORBITS: Trace paranasal sinus mucosal thickening. Mastoid air cells are well aerated.The included ocular globes and orbital contents are non-suspicious. Status post bilateral ocular lens implants. OTHER: None. IMPRESSION: 1. Involving acute LEFT > RIGHT parietal and LEFT occipital lobe infarcts without hemorrhagic conversion. 2. Mild-to-moderate chronic small vessel ischemic changes. Old small LEFT cerebellar infarct. Electronically Signed   By: Elon Alas M.D.   On: 03/05/2018 17:25   Ct Head Wo Contrast  Result Date: 03/03/2018 CLINICAL DATA:  Confused. Altered level of consciousness. Breast cancer. EXAM: CT HEAD WITHOUT CONTRAST TECHNIQUE: Contiguous axial images were obtained from the base of the skull through the vertex without intravenous contrast. COMPARISON:  02/26/2018. FINDINGS: Brain: Two separate areas of ill-defined abnormal cortical and subcortical edema in the LEFT hemisphere, affecting the LEFT parietal and LEFT occipital lobes, could represent multifocal infarcts versus rapid progression of metastatic disease. These areas were previously unremarkable on CT 5 days earlier. MRI of the brain without and with contrast recommended for further evaluation. Elsewhere, mild to moderate atrophy. Hypoattenuation of white matter, consistent with small vessel disease/post treatment effect. Vascular: Calcification of the cavernous internal carotid arteries consistent with cerebrovascular atherosclerotic disease. No signs of intracranial large vessel occlusion. Skull: Calvarium intact. Sinuses/Orbits: No sinus disease. Negative orbits. Other: None. IMPRESSION: Two separate areas of abnormal cortical and subcortical edema in the LEFT hemisphere, affecting the LEFT parietal and LEFT occipital lobes, could represent multifocal infarcts versus rapid progression of  metastatic disease. These were not present on the previous CT from 02/26/2018. MRI of the brain without and with contrast recommended for further evaluation. Electronically Signed   By: Staci Righter M.D.   On: 03/03/2018 13:26   Ct Head Wo Contrast  Result Date: 02/26/2018 CLINICAL DATA:  Blackouts for 1 month. History of multiple myeloma. EXAM: CT HEAD WITHOUT CONTRAST TECHNIQUE: Contiguous axial images were obtained from the base of the skull through the vertex without intravenous contrast. COMPARISON:  12/22/2015. FINDINGS: Brain: No evidence for acute infarction, hemorrhage, mass lesion, hydrocephalus, or extra-axial fluid. Mild atrophy. Hypoattenuation of white matter, consistent with small vessel disease or post treatment effect. 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 Vascular: Calcification of the cavernous internal carotid arteries consistent with cerebrovascular atherosclerotic disease. No signs of intracranial large vessel occlusion. Skull: Calvarium intact. Small lucency in the RIGHT frontal bone (series 3 image 50) unchanged from 2017. Sinuses/Orbits: No acute finding. Other: None. IMPRESSION: Atrophy and small vessel disease. No acute intracranial findings. Electronically Signed   By: Staci Righter M.D.   On: 02/26/2018 18:23   Ct Chest Wo Contrast  Result Date: 03/08/2018 CLINICAL DATA:  Large ulcerated gastric fundal mass found on upper endoscopy today. EXAM: CT CHEST, ABDOMEN AND PELVIS WITHOUT CONTRAST TECHNIQUE: Multidetector CT imaging of the chest, abdomen and pelvis was performed following the standard protocol without IV contrast. COMPARISON:  CT chest 01/20/2018 FINDINGS: CT CHEST  FINDINGS Cardiovascular: The heart size is normal. No substantial pericardial effusion. Coronary artery calcification is evident. Atherosclerotic calcification is noted in the wall of the thoracic aorta. Right Port-A-Cath tip is positioned in the distal SVC near the junction with the RA.  Mediastinum/Nodes: 12 mm short axis prevascular node seen on 17/3. Scattered small central mediastinal lymph nodes identified in the upper mediastinum. 12 mm short axis retrotracheal node visible on 16/3. No subcarinal or lower paraesophageal lymphadenopathy. No evidence for gross hilar lymphadenopathy although assessment is limited by the lack of intravenous contrast on today's study. There is no axillary lymphadenopathy. Lungs/Pleura: The central tracheobronchial airways are patent. 19 mm ring-like lesion in the right upper lobe is stable and may represent airway impaction with associated tree-in-bud nodularity. 6 mm medial right upper lobe nodule visible on 67/4. 5 mm right middle lobe nodule visible on 102/4, stable. 1.9 x 1.2 cm irregular opacity in the posterior left upper lobe is at the site of the 4.2 x 3.8 cm mass seen on PET-CT of 10/11/2015 and may reflect residual scarring. Small left pleural effusion evident. Musculoskeletal: No worrisome lytic or sclerotic osseous abnormality. CT ABDOMEN PELVIS FINDINGS Hepatobiliary: No focal abnormality in the liver on this study without intravenous contrast. Gallbladder surgically absent. No intrahepatic or extrahepatic biliary dilation. Pancreas: No focal mass lesion. No dilatation of the main duct. No intraparenchymal cyst. No peripancreatic edema. Spleen: Subtle 2.7 x 2.0 cm hypoattenuating lesion is identified in the dome of spleen, not definitely seen on prior. Adrenals/Urinary Tract: Right adrenal gland unremarkable. 1.6 cm left adrenal nodule stable since PET-CT of 10/01/2015. Cortical scarring noted in both kidneys. No evidence for hydroureter. The urinary bladder appears normal for the degree of distention. Stomach/Bowel: Tiny hiatal hernia noted. Wall thickening is noted in the esophagogastric junction and proximal stomach. Wall thickening extends along the medial aspect of the proximal stomach. Distal stomach unremarkable. Duodenum is normally positioned  as is the ligament of Treitz. No small bowel wall thickening. No small bowel dilatation. The terminal ileum is normal. The appendix is not visualized, but there is no edema or inflammation in the region of the cecum. No gross colonic mass. No colonic wall thickening. Vascular/Lymphatic: There is abdominal aortic atherosclerosis without aneurysm. Bulky lymphadenopathy is seen in the hepato duodenal ligament. 3.2 x 4.5 cm lymph node seen on 55/3. The 2.9 x 2.8 cm lymph node seen on 57/3. 10 mm short axis portal caval lymph node (63/3) is suspicious. No para-aortic retroperitoneal lymphadenopathy. No pelvic sidewall lymphadenopathy. Reproductive: The uterus is surgically absent. There is no adnexal mass. Other: No intraperitoneal free fluid. Musculoskeletal: No worrisome lytic or sclerotic osseous abnormality. IMPRESSION: 1. Marked wall thickening in the esophagogastric junction and proximal stomach, consistent with known neoplasm. Bulky lymphadenopathy in the gastrohepatic ligament is compatible with metastatic disease. 10 mm short axis portal caval lymph node also suspicious. 2. 2.7 cm hypoattenuating lesion in the spleen. This is not well assessed on today's noncontrast exam. Close attention recommended as metastatic disease not excluded. 3. Scattered tiny pulmonary nodules measuring up to 5 mm. Nonspecific, but close attention recommended as metastatic disease not excluded. 4. Mild lymphadenopathy noted in the upper mediastinum. 5. Circular lesion right upper lobe on axial imaging has a branching configuration on coronal and sagittal images. This is probably airway impaction related to atypical infection. Close follow-up recommended. 6. 1.9 x 1.2 cm irregular lesion posterior left upper lobe at the site of a 4 cm mass on PET-CT of 10/11/2015. Imaging features  today are compatible with residua of that lesion. 7. 1.6 cm left adrenal nodule stable since 10/11/2015, likely benign. 8.  Aortic Atherosclerois  (ICD10-170.0) Electronically Signed   By: Misty Stanley M.D.   On: 03/08/2018 19:19   Mr Brain Wo Contrast  Result Date: 03/03/2018 CLINICAL DATA:  Sudden onset confusion. EXAM: MRI HEAD WITHOUT CONTRAST TECHNIQUE: Multiplanar, multiecho pulse sequences of the brain and surrounding structures were obtained without intravenous contrast. COMPARISON:  Head CT 03/03/2018 FINDINGS: The examination had to be discontinued prior to completion due to altered mental status and inability to follow the technologist's instructions. Coronal and axial diffusion-weighted imaging, sagittal T1-weighted imaging and axial T2-weighted imaging were obtained. There is multifocal abnormal diffusion restriction within both hemispheres, including the left frontal lobe, both parietal lobes, left occipital lobe and both cerebellar hemispheres. The largest area of diffusion abnormalities in the posterior left parietal lobe. There is no midline shift or other mass effect. The midline structures are normal. There is moderate white matter hyperintense T2-weighted signal consistent with chronic microvascular ischemia. IMPRESSION: 1. Examination discontinued early due to patient altered mental status. 2. Multifocal bilateral acute ischemia within multiple vascular territories in both hemispheres. This may indicate a central cardiac or aortic embolic source. Electronically Signed   By: Ulyses Jarred M.D.   On: 03/03/2018 15:04   Ir Ivc Filter Plmt / S&i /img Guid/mod Sed  Result Date: 03/09/2018 INDICATION: Paradoxical embolus.  Calf vein DVT.  Pulmonary embolism. EXAM: IVC FILTER,INFERIOR VENA CAVOGRAM MEDICATIONS: None. ANESTHESIA/SEDATION: Fentanyl 75 mcg IV; Versed 1 mg IV Moderate Sedation Time:  17 minutes The patient was continuously monitored during the procedure by the interventional radiology nurse under my direct supervision. FLUOROSCOPY TIME:  Fluoroscopy Time: 3 minutes 30 seconds (31 mGy). COMPLICATIONS: None immediate.  PROCEDURE: Informed written consent was obtained from the patient after a thorough discussion of the procedural risks, benefits and alternatives. All questions were addressed. Maximal Sterile Barrier Technique was utilized including caps, mask, sterile gowns, sterile gloves, sterile drape, hand hygiene and skin antiseptic. A timeout was performed prior to the initiation of the procedure. The right neck was prepped with Betadine in a sterile fashion, and a sterile drape was applied covering the operative field. A sterile gown and sterile gloves were used for the procedure. The right jugular vein was noted to be patent initially with ultrasound. Under sonographic guidance, a micropuncture needle was inserted into the right jugular vein (Ultrasound image documentation was performed). It was removed over an 018 wire which was up-sized to a [place]. The sheath was inserted over the wire and into the IVC. IVC carbon dioxide venography was performed. The temporary [place] filter was then deployed in the infrarenal IVC. The sheath was removed and hemostasis was achieved with direct pressure. FINDINGS: IVC venography demonstrates renal inflow at the mid L2 level. There is no venous anomaly. There is no evidence of mega cava. Findings were corroborated with the accompanying CT abdomen. The final image demonstrates a deployed IVC filter with its tip at the lower L2 endplate. IMPRESSION: Successful infrarenal IVC filter placement. This is a temporary filter. It can be removed or remain in place to become permanent. PLAN: This IVC filter is potentially retrievable. The patient will be assessed for filter retrieval by Interventional Radiology in approximately 8-12 weeks. Further recommendations regarding filter retrieval, continued surveillance or declaration of device permanence, will be made at that time. Electronically Signed   By: Marybelle Killings M.D.   On: 03/09/2018 12:15  Dg Abd Acute 2+v W 1v Chest  Result Date:  02/26/2018 CLINICAL DATA:  Lapse in memory. EXAM: DG ABDOMEN ACUTE W/ 1V CHEST COMPARISON:  None. FINDINGS: There is no evidence of dilated bowel loops or free intraperitoneal air. No radiopaque calculi or other significant radiographic abnormality is seen. Heart size and mediastinal contours are within normal limits. Both lungs are clear. Calcified tortuous aorta. Degenerative RIGHT SI joint. Port-A-Cath, RIGHT chest subclavian approach, tip RIGHT atrium. IMPRESSION: Negative abdominal radiographs. No acute cardiopulmonary disease. Electronically Signed   By: Staci Righter M.D.   On: 02/26/2018 18:16   Dg Swallowing Func-speech Pathology  Result Date: 03/06/2018 Objective Swallowing Evaluation: Type of Study: MBS-Modified Barium Swallow Study  Patient Details Name: NAEEMAH JASMER MRN: [id number] Date of Birth: May 11, 1940 Today's Date: 03/06/2018 Time: SLP Start Time (ACUTE ONLY): 0825 -SLP Stop Time (ACUTE ONLY): 0840 SLP Time Calculation (min) (ACUTE ONLY): 15 min Past Medical History: Past Medical History: Diagnosis Date . Anemia  . Anginal pain ([place])   used NTG x 2 May 31 and 06/15/13  . Anxiety  . Arthritis  . B12 deficiency 12/04/2014 . Breast cancer ([place])  . Complication of anesthesia  . COPD (chronic obstructive pulmonary disease) ([place])  . 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 Depression  . Dizziness  . Dyspnea  . Fibromyalgia  . Fibromyalgia  . GERD (gastroesophageal reflux disease)  . Headache(784.0)  . Heart murmur  . Hemoptysis 10/21/2015 . History of blood transfusion   last one May 12  . Hx of cardiovascular stress test   LexiScan with low level exercise Myoview (02/2013): No ischemia, EF 72%; normal study . Hx of echocardiogram   a.  Echocardiogram (12/26/2012): EF 21-11%, grade 1 diastolic dysfunction;   b.  Echocardiogram (02/2013): EF 55-60%, no WMA, trivial effusion . Hyperkalemia  . Hyponatremia  . Hypotension  . Hypothyroidism  . Mucositis  . Multiple myeloma  . Myocardial infarction [facility])    in past, patient was unaware.  . Neuropathy  . Nodule of left lung 09/13/2015 . Pain in joint, pelvic region and thigh 07/07/2015 . Pneumonia   several . PONV (postoperative nausea and vomiting) 2008  after mastestomy Past Surgical History: Past Surgical History: Procedure Laterality Date . ABDOMINAL HYSTERECTOMY  1981 . AV FISTULA PLACEMENT Left 06/19/2013  Procedure: CREATION OF LEFT ARM ARTERIOVENOUS (AV) FISTULA ;  Surgeon: Angelia Mould, MD;  Location: [place];  Service: Vascular;  Laterality: Left; . BREAST RECONSTRUCTION   . BREAST SURGERY Right   reduction . CATARACT EXTRACTION, BILATERAL   . CHOLECYSTECTOMY  1971 . COLONOSCOPY   . EYE SURGERY Bilateral   lens implant . history of Port removal   . LUNG BIOPSY  10/21/2015 . MASTECTOMY Left 2008 . PORTACATH PLACEMENT  12/2012  has had 2 . RESECTION OF ARTERIOVENOUS FISTULA ANEURYSM Left 06/04/2017  Procedure: LIGATION ANEURYSM OF LEFT ARTERIOVENOUS FISTULA;  Surgeon: Angelia Mould, MD;  Location: [place];  Service: Vascular;  Laterality: Left; . Status post stem cell transplant on September 28, 2008.   HPI: Patient is a 78 y/o female presents with confusion, amnesia and HA. Head CT- left occipital and left frontal parietial infarcts. MRI- multiple posterior/anterior bilateral infarcts. PMH includes multiple myeloma currently getting chemo, MI, COPD, hypotension, breast ca, fibromyalgia.  Subjective: pleasant Assessment / Plan / Recommendation CHL IP CLINICAL IMPRESSIONS 03/06/2018 Clinical Impression Pt demonstrated reduced lingual retraction which resulted in mild vallecular residue and transient penetration (PAS 2) with thin liquids when consecutive swallows  were used. [name] independent use of dry secondary swallows was effective in reducing the residue and residue was eliminated with use of aliquid wash. Overall, her swallow mechanism appears to be within functional limits with consideration of her age and she does not appear to be at significant risk  of aspiration at this time. It is recommended that a regular texture diet be continued but, with consideration of her reported xerostomia, liquid washes may be beneficial to improve bolus flow. SLP will continue to follow for cognitive-linguisitic treatment and will see the pt once more for swallowing to ensure her observance of compensatory strategies. SLP Visit Diagnosis Dysphagia, unspecified (R13.10) Attention and concentration deficit following -- Frontal lobe and executive function deficit following -- Impact on safety and function No limitations   CHL IP TREATMENT RECOMMENDATION 03/06/2018 Treatment Recommendations Therapy as outlined in treatment plan below   Prognosis 03/06/2018 Prognosis for Safe Diet Advancement Good Barriers to Reach Goals Cognitive deficits Barriers/Prognosis Comment -- CHL IP DIET RECOMMENDATION 03/06/2018 SLP Diet Recommendations Regular solids;Thin liquid Liquid Administration via Cup;Straw Medication Administration Whole meds with liquid Compensations Small sips/bites;Slow rate;Follow solids with liquid Postural Changes Remain semi-upright after after feeds/meals (Comment);Seated upright at 90 degrees   No flowsheet data found.  CHL IP FOLLOW UP RECOMMENDATIONS 03/06/2018 Follow up Recommendations Inpatient Rehab   CHL IP FREQUENCY AND DURATION 03/06/2018 Speech Therapy Frequency (ACUTE ONLY) min 2x/week Treatment Duration 2 weeks      CHL IP ORAL PHASE 03/06/2018 Oral Phase WFL Oral - Pudding Teaspoon -- Oral - Pudding Cup -- Oral - Honey Teaspoon -- Oral - Honey Cup -- Oral - Nectar Teaspoon -- Oral - Nectar Cup -- Oral - Nectar Straw -- Oral - Thin Teaspoon -- Oral - Thin Cup -- Oral - Thin Straw -- Oral - Puree -- Oral - Mech Soft -- Oral - Regular -- Oral - Multi-Consistency -- Oral - Pill -- Oral Phase - Comment --  CHL IP PHARYNGEAL PHASE 03/06/2018 Pharyngeal Phase WFL Pharyngeal- Pudding Teaspoon -- Pharyngeal -- Pharyngeal- Pudding Cup -- Pharyngeal -- Pharyngeal- Honey Teaspoon  -- Pharyngeal -- Pharyngeal- Honey Cup -- Pharyngeal -- Pharyngeal- Nectar Teaspoon -- Pharyngeal -- Pharyngeal- Nectar Cup -- Pharyngeal -- Pharyngeal- Nectar Straw -- Pharyngeal -- Pharyngeal- Thin Teaspoon -- Pharyngeal -- Pharyngeal- Thin Cup Penetration/Aspiration during swallow Pharyngeal Material enters airway, remains ABOVE vocal cords then ejected out Pharyngeal- Thin Straw Penetration/Aspiration during swallow Pharyngeal Material enters airway, remains ABOVE vocal cords then ejected out Pharyngeal- Puree Penetration/Aspiration before swallow;Penetration/Aspiration during swallow;Pharyngeal residue - valleculae Pharyngeal Material enters airway, remains ABOVE vocal cords then ejected out Pharyngeal- Mechanical Soft -- Pharyngeal -- Pharyngeal- Regular Pharyngeal residue - valleculae Pharyngeal -- Pharyngeal- Multi-consistency -- Pharyngeal -- Pharyngeal- Pill -- Pharyngeal -- Pharyngeal Comment --  Shanika I. Hardin Negus, [place], [place] Office number [phone] Pager [place] 03/06/2018, 9:42 AM              Mr [place] Wo Contrast  Result Date: 03/03/2018 CLINICAL DATA:  Stroke follow-up EXAM: MRA HEAD WITHOUT CONTRAST TECHNIQUE: Angiographic images of the Circle of Willis were obtained using MRA technique without intravenous contrast. COMPARISON:  Brain MRI 03/03/2018 FINDINGS: POSTERIOR CIRCULATION: --Basilar artery: Normal. --Posterior cerebral arteries: Normal. Both originate from the basilar artery. --Superior cerebellar arteries: Normal. --Inferior cerebellar arteries: Normal anterior and posterior inferior cerebellar arteries. ANTERIOR CIRCULATION: --Intracranial internal carotid arteries: Normal. --Anterior cerebral arteries: Normal. Both A1 segments are present. Patent anterior communicating artery. --Middle cerebral arteries: Normal. --  Posterior communicating arteries: Absent bilaterally. IMPRESSION: Normal intracranial MRA. Electronically  Signed   By: Ulyses Jarred M.D.   On: 03/03/2018 22:51   Vas US Carotid (at [facility] Only)  Result Date: 03/06/2018 Carotid Arterial Duplex Study Indications: CVA. Performing Technologist: Oliver Hum RVT  Examination Guidelines: A complete evaluation includes B-mode imaging, spectral Doppler, color Doppler, and power Doppler as needed of all accessible portions of each vessel. Bilateral testing is considered an integral part of a complete examination. Limited examinations for reoccurring indications may be performed as noted.  Right Carotid Findings: +----------+--------+-------+--------+----------------------+------------------+           PSV cm/sEDV    StenosisDescribe              Comments                             cm/s                                                    +----------+--------+-------+--------+----------------------+------------------+ CCA Prox  68      18                                   intimal thickening +----------+--------+-------+--------+----------------------+------------------+ CCA Distal77      20                                   intimal thickening +----------+--------+-------+--------+----------------------+------------------+ ICA Prox  72      21             smooth and                                                                heterogenous                             +----------+--------+-------+--------+----------------------+------------------+ ICA Distal70      27                                                      +----------+--------+-------+--------+----------------------+------------------+ ECA       63      13                                                      +----------+--------+-------+--------+----------------------+------------------+ +----------+--------+-------+--------+-------------------+           PSV cm/sEDV cmsDescribeArm Pressure (mmHG)  +----------+--------+-------+--------+-------------------+ [id number]                                         +----------+--------+-------+--------+-------------------+ +---------+--------+--+--------+-+---------+  VertebralPSV cm/s31EDV cm/s9Antegrade +---------+--------+--+--------+-+---------+  Left Carotid Findings: +----------+--------+--------+--------+-----------------------+--------+           PSV cm/sEDV cm/sStenosisDescribe               Comments +----------+--------+--------+--------+-----------------------+--------+ CCA Prox  82      23              smooth and heterogenous         +----------+--------+--------+--------+-----------------------+--------+ CCA Distal64      19              smooth and heterogenous         +----------+--------+--------+--------+-----------------------+--------+ ICA Prox  66      20              smooth and heterogenous         +----------+--------+--------+--------+-----------------------+--------+ ICA Distal81      32                                              +----------+--------+--------+--------+-----------------------+--------+ ECA       70      17                                              +----------+--------+--------+--------+-----------------------+--------+ +----------+--------+--------+--------+-------------------+ SubclavianPSV cm/sEDV cm/sDescribeArm Pressure (mmHG) +----------+--------+--------+--------+-------------------+           96                                          +----------+--------+--------+--------+-------------------+ +---------+--------+--+--------+--+---------+ VertebralPSV cm/s54EDV cm/s19Antegrade +---------+--------+--+--------+--+---------+  Summary: Right Carotid: Velocities in the right ICA are consistent with a 1-39% stenosis. Left Carotid: Velocities in the left ICA are consistent with a 1-39% stenosis. Vertebrals: Bilateral vertebral arteries demonstrate  antegrade flow. *See table(s) above for measurements and observations.  Electronically signed by Antony Contras MD on 03/06/2018 at 11:57:28 AM.    Final    Vas [place] Lower Extremity Venous (dvt)  Result Date: 03/07/2018  Lower Venous Study Other Indications: Stroke with PFO. Performing Technologist: June Leap RDMS, RVT  Examination Guidelines: A complete evaluation includes B-mode imaging, spectral Doppler, color Doppler, and power Doppler as needed of all accessible portions of each vessel. Bilateral testing is considered an integral part of a complete examination. Limited examinations for reoccurring indications may be performed as noted.  Right Venous Findings: +---------+---------------+---------+-----------+----------+-------------------+          CompressibilityPhasicitySpontaneityPropertiesSummary             +---------+---------------+---------+-----------+----------+-------------------+ CFV      Full           Yes      Yes                                      +---------+---------------+---------+-----------+----------+-------------------+ SFJ      Full                                                             +---------+---------------+---------+-----------+----------+-------------------+  FV Prox  Full                                                             +---------+---------------+---------+-----------+----------+-------------------+ FV Mid   Full                                                             +---------+---------------+---------+-----------+----------+-------------------+ FV DistalFull                                                             +---------+---------------+---------+-----------+----------+-------------------+ PFV      Full                                                             +---------+---------------+---------+-----------+----------+-------------------+ POP      Full           Yes      Yes                                       +---------+---------------+---------+-----------+----------+-------------------+ PTV      Full                                         not well visualized +---------+---------------+---------+-----------+----------+-------------------+ PERO     Full                                         not well visualized +---------+---------------+---------+-----------+----------+-------------------+  Left Venous Findings: +---------+---------------+---------+-----------+----------+------------------+          CompressibilityPhasicitySpontaneityPropertiesSummary            +---------+---------------+---------+-----------+----------+------------------+ CFV      Full           Yes      Yes                                     +---------+---------------+---------+-----------+----------+------------------+ SFJ      Full                                                            +---------+---------------+---------+-----------+----------+------------------+ FV Prox  Full                                                            +---------+---------------+---------+-----------+----------+------------------+  FV Mid   Full                                                            +---------+---------------+---------+-----------+----------+------------------+ FV DistalFull                                                            +---------+---------------+---------+-----------+----------+------------------+ PFV      Full                                                            +---------+---------------+---------+-----------+----------+------------------+ POP      Full           Yes      Yes                                     +---------+---------------+---------+-----------+----------+------------------+ PTV      None                                         isolated proximal                                                         calf               +---------+---------------+---------+-----------+----------+------------------+ PERO     None                                         isolated proximal                                                        calf               +---------+---------------+---------+-----------+----------+------------------+    Summary: Right: There is no evidence of deep vein thrombosis in the lower extremity. No cystic structure found in the popliteal fossa. Left: Findings consistent with acute deep vein thrombosis involving the left posterior tibial vein, and left peroneal vein. No cystic structure found in the popliteal fossa.  *See table(s) above for measurements and observations. Electronically signed by Deitra Mayo MD on 03/07/2018 at 7:51:58 PM.    Final    Dg Esophagus W Single Cm (sol Or Thin Ba)  Result Date: 03/06/2018 CLINICAL DATA:  Dysphagia EXAM: ESOPHOGRAM/BARIUM SWALLOW TECHNIQUE: Single contrast examination was performed using thick  and thin barium FLUOROSCOPY TIME:  Fluoroscopy Time:  1 minutes, 48 seconds Radiation Exposure Index (if provided by the fluoroscopic device): 17.9 mGy Number of Acquired Spot Images: None COMPARISON:  CT chest from 01/20/2018 FINDINGS: The patient was relatively frail, and I elected not to give gas crystals. With patient laying down, swallows were performed in the LPO position. Mild distal esophageal fold thickening. No obvious ulceration. Mildly dilated esophagus. Primary peristaltic waves in the esophagus were disrupted on 4/4 swallows in the mid esophagus level. Initial swallows demonstrate some lobularity and irregularity along the upper margin of the gastroesophageal junction for example on image 35/1. Tumor or ulceration not readily excluded given this appearance. In the LPO position, there is poor filling of the gastroesophageal junction, with only slow percolated shin of contrast from the dilated esophagus into the stomach. A 13 mm  barium tablet passed without difficulty into the stomach. Limited assessment of the upper stomach demonstrates no appreciable gastric diverticulum along the right margin of the upper stomach, raising concern that the rounded density in this vicinity on the recent chest CT from 01/20/2018 measuring 3.6 by 2.6 cm on that exam probably represents gastrohepatic ligament pathologic adenopathy rather than a diverticulum. IMPRESSION: 1. Nonspecific esophageal dysmotility disorder with disruption of primary peristaltic waves in the mid esophagus. 2. Distal esophageal irregularity potentially with ulceration. Difficult to exclude tumor of the distal esophagus. Endoscopy is recommended. 3. 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 Electronically Signed   By: Van Clines M.D.   On: 03/06/2018 09:31    EKG     EKG: EKG was personally reviewed and demonstrates: Normal sinus rhythm, rate 68 bpm, with inferior Q waves and non-specific ST/T changes which are new from prior tracings this admission.   Telemetry: Telemetry was personally reviewed and demonstrates: Sinus rhythm with heart rates in the 60's to 90's.   Cardiac Imaging    Echocardiogram 03/05/2018: Impressions:  1. The left ventricle has normal systolic function with an ejection fraction of 60-65%. The cavity size was normal. Left ventricular diastolic parameters were normal.  2. The right ventricle has normal systolic function. The cavity was normal. There is no increase in right ventricular wall thickness.  3. The mitral valve is degenerative. Mild thickening of the mitral valve leaflet. Mild calcification of the mitral valve leaflet.  4. The tricuspid valve is normal in structure.  5. The aortic valve is  tricuspid Mild thickening of the aortic valve Mild calcification of the aortic valve.  6. The pulmonic valve was grossly normal. Pulmonic valve regurgitation is mild by color flow Doppler. _______________  TEE 03/07/2018: Impressions:  1. Small patent foramen ovale with predominantly left to right shunting across the atrial septum.  2. Evidence of atrial level shunting detected by color flow Doppler.  3. The left ventricle has normal systolic function, with an ejection fraction of 60-65%. The cavity size was normal. No evidence of left ventricular regional wall motion abnormalities.  4. The right ventricle has normal systolc function. The cavity was normal. There is no increase in right ventricular wall thickness.  5. The mitral valve is normal in structure. Mild thickening of the mitral valve leaflet.  6. The tricuspid valve was normal in structure.  7. The aortic valve is tricuspid.  8. There is evidence of plaque in the descending aorta.  9. No evidence of left ventricular regional wall motion abnormalities.  Assessment & Plan    NSTEMI - Patient developed dizziness and chest tightness while ambulating today. Was found have orthostatic hypotension and an elevated troponin.  - EKG showed inferior Q waves and non-specific ST/T changes which are new from prior tracings this admission. - Troponin elevated at 2.06. Continue to trend. - Echo this admission showed LVEF of 60-65%.    - Patient is currently asymptomatic.  - Will get limited Echo to look for wall motion abnormalities.  - Patient has CKD stage IV and serum creatinine 2.34; therefore would not pursue cardiac catheterization or coronary CT with contrast dye at this time. Could consider Myoview for further evaluation. Patient is not excited about pursuing further cardiac test because she is not sure "where her cancer is at." Will likely treat medically.  Orthostatic Hypotension - Patient has a history of postural hypotension and is  on Midodrine 27m daily at home.  - BP dropped from 120/61 while lying down to 76/40 with standing today. - Continue home Midodrine.  - Currently receiving IV fluids.  Acute Stroke - Patient presented with altered mental status and was found to have an acute stroke.  - TEE showed small patent foramen ovale with predominantly left to right shunting across the atrial septum. - Currently on Aspirin 3266mdaily. - Continue statin. - Management per Neurology and primary team.   Acute DVT  - Lower extremity dopplers showed acute DVT of left posterior tibial vein and left peroneal vein.  - Patient underwent IVC filter placement.   Multiple Myeloma with Pancytopenia - History of bone marrow transplant in 2010. Followed by Dr. MoJulien Nordmann - Most recent labs: WBC 3.3, Hgb 7.2, Plts 67.  - Management per primary team.  CKD Stage IV - History of AV fistula placement in 2015.  - Most recent serum creatinine 2.34. Baseline around 2.3 to 2.5.  - Avoid nephrotoxins. Avoid contrast.  - Followed by Nephrology as outpatient.  Signed, CaDarreld McleanPA-C 03/10/2018, 1:35 PM   I have examined the patient and reviewed assessment and plan and discussed with patient.  Agree with above as stated.    Patient with multiple medical issues.  She likely has some type of gastric malignancy.  She has anemia and chronic renal insufficiency.  Unclear what the episode was earlier today where she had chest discomfort and dizziness.  She does have new Q waves on her ECG in the inferior leads.  Given the recent history with stroke, PFO and DVT, it is possible that she had some type of embolic event down to coronary artery.  1) recheck limited echo to look for wall motion abnormalities.  She is currently asymptomatic.  We spoke about cardiac catheterization.  She is not interested at this time given her other comorbidities.  I agree she is high risk for cath.  We will also await treatment recommendations from  oncology regarding her possible malignancy.  2) continue to try to avoid nephrotoxins.  She is concerned about having to go back on dialysis at some point, as a risk of cardiac cath.  She will need anticoagulation for her DVT.  Typically, CVA from PFO occurs in younger patients.   JaLarae Grooms For questions or updates, please contact   Please consult [email or web address] for contact info under Cardiology/STEMI.

## 2018-03-10 NOTE — Progress Notes (Signed)
Called to room by PT d/t patient c/o chest tightness and dizziness. Pt. Back to bed upon arrival. Re-assessed, chest tenderness  During deep breathes. Resolved within 5 minutes of arrival to the room. SBP up to 120, was 88 with PT. Dr. Erlinda Hong notified, orders received.

## 2018-03-10 NOTE — Progress Notes (Signed)
Subjective: The patient is seen and examined today.  She is feeling fine with no concerning complaints except for the easy satiety.  She was found recently to have gastric mass on upper endoscopy but the final pathology is still pending.  The patient continues to have mild fatigue but no concerning nausea, vomiting, diarrhea or constipation she has no shortness of breath at rest.  Objective: Vital signs in last 24 hours: Temp:  [97.6 F (36.4 C)-98.7 F (37.1 C)] 98 F (36.7 C) (02/24 0725) Pulse Rate:  [62-74] 70 (02/24 0725) Resp:  [10-20] 16 (02/24 0725) BP: (96-131)/(62-82) 96/68 (02/24 0725) SpO2:  [93 %-100 %] 96 % (02/24 0725)  Intake/Output from previous day: No intake/output data recorded. Intake/Output this shift: No intake/output data recorded.  General appearance: alert, cooperative, fatigued and no distress Resp: clear to auscultation bilaterally Cardio: regular rate and rhythm, S1, S2 normal, no murmur, click, rub or gallop GI: soft, non-tender; bowel sounds normal; no masses,  no organomegaly Extremities: extremities normal, atraumatic, no cyanosis or edema  Lab Results:  Recent Labs    03/08/18 0532 03/09/18 0423  WBC 2.7* 3.3*  HGB 7.3* 7.2*  HCT 24.1* 23.5*  PLT 72* 67*   BMET Recent Labs    03/08/18 0532 03/09/18 0423  NA 140 137  K 3.6 3.6  CL 108 105  CO2 21* 23  GLUCOSE 95 102*  BUN 14 13  CREATININE 2.47* 2.34*  CALCIUM 8.2* 8.3*    Studies/Results: Ct Abdomen Pelvis Wo Contrast  Result Date: 03/08/2018 CLINICAL DATA:  Large ulcerated gastric fundal mass found on upper endoscopy today. EXAM: CT CHEST, ABDOMEN AND PELVIS WITHOUT CONTRAST TECHNIQUE: Multidetector CT imaging of the chest, abdomen and pelvis was performed following the standard protocol without IV contrast. COMPARISON:  CT chest 01/20/2018 FINDINGS: CT CHEST FINDINGS Cardiovascular: The heart size is normal. No substantial pericardial effusion. Coronary artery calcification is  evident. Atherosclerotic calcification is noted in the wall of the thoracic aorta. Right Port-A-Cath tip is positioned in the distal SVC near the junction with the RA. Mediastinum/Nodes: 12 mm short axis prevascular node seen on 17/3. Scattered small central mediastinal lymph nodes identified in the upper mediastinum. 12 mm short axis retrotracheal node visible on 16/3. No subcarinal or lower paraesophageal lymphadenopathy. No evidence for gross hilar lymphadenopathy although assessment is limited by the lack of intravenous contrast on today's study. There is no axillary lymphadenopathy. Lungs/Pleura: The central tracheobronchial airways are patent. 19 mm ring-like lesion in the right upper lobe is stable and may represent airway impaction with associated tree-in-bud nodularity. 6 mm medial right upper lobe nodule visible on 67/4. 5 mm right middle lobe nodule visible on 102/4, stable. 1.9 x 1.2 cm irregular opacity in the posterior left upper lobe is at the site of the 4.2 x 3.8 cm mass seen on PET-CT of 10/11/2015 and may reflect residual scarring. Small left pleural effusion evident. Musculoskeletal: No worrisome lytic or sclerotic osseous abnormality. CT ABDOMEN PELVIS FINDINGS Hepatobiliary: No focal abnormality in the liver on this study without intravenous contrast. Gallbladder surgically absent. No intrahepatic or extrahepatic biliary dilation. Pancreas: No focal mass lesion. No dilatation of the main duct. No intraparenchymal cyst. No peripancreatic edema. Spleen: Subtle 2.7 x 2.0 cm hypoattenuating lesion is identified in the dome of spleen, not definitely seen on prior. Adrenals/Urinary Tract: Right adrenal gland unremarkable. 1.6 cm left adrenal nodule stable since PET-CT of 10/01/2015. Cortical scarring noted in both kidneys. No evidence for hydroureter. The urinary bladder  appears normal for the degree of distention. Stomach/Bowel: Tiny hiatal hernia noted. Wall thickening is noted in the  esophagogastric junction and proximal stomach. Wall thickening extends along the medial aspect of the proximal stomach. Distal stomach unremarkable. Duodenum is normally positioned as is the ligament of Treitz. No small bowel wall thickening. No small bowel dilatation. The terminal ileum is normal. The appendix is not visualized, but there is no edema or inflammation in the region of the cecum. No gross colonic mass. No colonic wall thickening. Vascular/Lymphatic: There is abdominal aortic atherosclerosis without aneurysm. Bulky lymphadenopathy is seen in the hepato duodenal ligament. 3.2 x 4.5 cm lymph node seen on 55/3. The 2.9 x 2.8 cm lymph node seen on 57/3. 10 mm short axis portal caval lymph node (63/3) is suspicious. No para-aortic retroperitoneal lymphadenopathy. No pelvic sidewall lymphadenopathy. Reproductive: The uterus is surgically absent. There is no adnexal mass. Other: No intraperitoneal free fluid. Musculoskeletal: No worrisome lytic or sclerotic osseous abnormality. IMPRESSION: 1. Marked wall thickening in the esophagogastric junction and proximal stomach, consistent with known neoplasm. Bulky lymphadenopathy in the gastrohepatic ligament is compatible with metastatic disease. 10 mm short axis portal caval lymph node also suspicious. 2. 2.7 cm hypoattenuating lesion in the spleen. This is not well assessed on today's noncontrast exam. Close attention recommended as metastatic disease not excluded. 3. Scattered tiny pulmonary nodules measuring up to 5 mm. Nonspecific, but close attention recommended as metastatic disease not excluded. 4. Mild lymphadenopathy noted in the upper mediastinum. 5. Circular lesion right upper lobe on axial imaging has a branching configuration on coronal and sagittal images. This is probably airway impaction related to atypical infection. Close follow-up recommended. 6. 1.9 x 1.2 cm irregular lesion posterior left upper lobe at the site of a 4 cm mass on PET-CT of  10/11/2015. Imaging features today are compatible with residua of that lesion. 7. 1.6 cm left adrenal nodule stable since 10/11/2015, likely benign. 8.  Aortic Atherosclerois (ICD10-170.0) Electronically Signed   By: Misty Stanley M.D.   On: 03/08/2018 19:19   Ct Chest Wo Contrast  Result Date: 03/08/2018 CLINICAL DATA:  Large ulcerated gastric fundal mass found on upper endoscopy today. EXAM: CT CHEST, ABDOMEN AND PELVIS WITHOUT CONTRAST TECHNIQUE: Multidetector CT imaging of the chest, abdomen and pelvis was performed following the standard protocol without IV contrast. COMPARISON:  CT chest 01/20/2018 FINDINGS: CT CHEST FINDINGS Cardiovascular: The heart size is normal. No substantial pericardial effusion. Coronary artery calcification is evident. Atherosclerotic calcification is noted in the wall of the thoracic aorta. Right Port-A-Cath tip is positioned in the distal SVC near the junction with the RA. Mediastinum/Nodes: 12 mm short axis prevascular node seen on 17/3. Scattered small central mediastinal lymph nodes identified in the upper mediastinum. 12 mm short axis retrotracheal node visible on 16/3. No subcarinal or lower paraesophageal lymphadenopathy. No evidence for gross hilar lymphadenopathy although assessment is limited by the lack of intravenous contrast on today's study. There is no axillary lymphadenopathy. Lungs/Pleura: The central tracheobronchial airways are patent. 19 mm ring-like lesion in the right upper lobe is stable and may represent airway impaction with associated tree-in-bud nodularity. 6 mm medial right upper lobe nodule visible on 67/4. 5 mm right middle lobe nodule visible on 102/4, stable. 1.9 x 1.2 cm irregular opacity in the posterior left upper lobe is at the site of the 4.2 x 3.8 cm mass seen on PET-CT of 10/11/2015 and may reflect residual scarring. Small left pleural effusion evident. Musculoskeletal: No worrisome lytic  or sclerotic osseous abnormality. CT ABDOMEN PELVIS  FINDINGS Hepatobiliary: No focal abnormality in the liver on this study without intravenous contrast. Gallbladder surgically absent. No intrahepatic or extrahepatic biliary dilation. Pancreas: No focal mass lesion. No dilatation of the main duct. No intraparenchymal cyst. No peripancreatic edema. Spleen: Subtle 2.7 x 2.0 cm hypoattenuating lesion is identified in the dome of spleen, not definitely seen on prior. Adrenals/Urinary Tract: Right adrenal gland unremarkable. 1.6 cm left adrenal nodule stable since PET-CT of 10/01/2015. Cortical scarring noted in both kidneys. No evidence for hydroureter. The urinary bladder appears normal for the degree of distention. Stomach/Bowel: Tiny hiatal hernia noted. Wall thickening is noted in the esophagogastric junction and proximal stomach. Wall thickening extends along the medial aspect of the proximal stomach. Distal stomach unremarkable. Duodenum is normally positioned as is the ligament of Treitz. No small bowel wall thickening. No small bowel dilatation. The terminal ileum is normal. The appendix is not visualized, but there is no edema or inflammation in the region of the cecum. No gross colonic mass. No colonic wall thickening. Vascular/Lymphatic: There is abdominal aortic atherosclerosis without aneurysm. Bulky lymphadenopathy is seen in the hepato duodenal ligament. 3.2 x 4.5 cm lymph node seen on 55/3. The 2.9 x 2.8 cm lymph node seen on 57/3. 10 mm short axis portal caval lymph node (63/3) is suspicious. No para-aortic retroperitoneal lymphadenopathy. No pelvic sidewall lymphadenopathy. Reproductive: The uterus is surgically absent. There is no adnexal mass. Other: No intraperitoneal free fluid. Musculoskeletal: No worrisome lytic or sclerotic osseous abnormality. IMPRESSION: 1. Marked wall thickening in the esophagogastric junction and proximal stomach, consistent with known neoplasm. Bulky lymphadenopathy in the gastrohepatic ligament is compatible with metastatic  disease. 10 mm short axis portal caval lymph node also suspicious. 2. 2.7 cm hypoattenuating lesion in the spleen. This is not well assessed on today's noncontrast exam. Close attention recommended as metastatic disease not excluded. 3. Scattered tiny pulmonary nodules measuring up to 5 mm. Nonspecific, but close attention recommended as metastatic disease not excluded. 4. Mild lymphadenopathy noted in the upper mediastinum. 5. Circular lesion right upper lobe on axial imaging has a branching configuration on coronal and sagittal images. This is probably airway impaction related to atypical infection. Close follow-up recommended. 6. 1.9 x 1.2 cm irregular lesion posterior left upper lobe at the site of a 4 cm mass on PET-CT of 10/11/2015. Imaging features today are compatible with residua of that lesion. 7. 1.6 cm left adrenal nodule stable since 10/11/2015, likely benign. 8.  Aortic Atherosclerois (ICD10-170.0) Electronically Signed   By: Misty Stanley M.D.   On: 03/08/2018 19:19   Ir Ivc Filter Plmt / S&i /img Guid/mod Sed  Result Date: 03/09/2018 INDICATION: Paradoxical embolus.  Calf vein DVT.  Pulmonary embolism. EXAM: IVC FILTER,INFERIOR VENA CAVOGRAM MEDICATIONS: None. ANESTHESIA/SEDATION: Fentanyl 75 mcg IV; Versed 1 mg IV Moderate Sedation Time:  17 minutes The patient was continuously monitored during the procedure by the interventional radiology nurse under my direct supervision. FLUOROSCOPY TIME:  Fluoroscopy Time: 3 minutes 30 seconds (31 mGy). COMPLICATIONS: None immediate. PROCEDURE: Informed written consent was obtained from the patient after a thorough discussion of the procedural risks, benefits and alternatives. All questions were addressed. Maximal Sterile Barrier Technique was utilized including caps, mask, sterile gowns, sterile gloves, sterile drape, hand hygiene and skin antiseptic. A timeout was performed prior to the initiation of the procedure. The right neck was prepped with Betadine  in a sterile fashion, and a sterile drape was applied covering the  operative field. A sterile gown and sterile gloves were used for the procedure. The right jugular vein was noted to be patent initially with ultrasound. Under sonographic guidance, a micropuncture needle was inserted into the right jugular vein (Ultrasound image documentation was performed). It was removed over an 018 wire which was up-sized to a [place]. The sheath was inserted over the wire and into the IVC. IVC carbon dioxide venography was performed. The temporary [place] filter was then deployed in the infrarenal IVC. The sheath was removed and hemostasis was achieved with direct pressure. FINDINGS: IVC venography demonstrates renal inflow at the mid L2 level. There is no venous anomaly. There is no evidence of mega cava. Findings were corroborated with the accompanying CT abdomen. The final image demonstrates a deployed IVC filter with its tip at the lower L2 endplate. IMPRESSION: Successful infrarenal IVC filter placement. This is a temporary filter. It can be removed or remain in place to become permanent. PLAN: This IVC filter is potentially retrievable. The patient will be assessed for filter retrieval by Interventional Radiology in approximately 8-12 weeks. Further recommendations regarding filter retrieval, continued surveillance or declaration of device permanence, will be made at that time. Electronically Signed   By: Marybelle Killings M.D.   On: 03/09/2018 12:15    Medications: I have reviewed the patient's current medications.  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  The patient also has history of multiple myeloma and she has been on and off treatment for several years.  Most recently she was treated with carfilzomib, Cytoxan and dexamethasone.  She also has chronic renal insufficiency. Her treatment has always been complicated with  pancytopenia. She was recently admitted to the hospital with shortness of breath and she was found to have pulmonary embolism and currently on anticoagulation. The patient was also found to have gastrointestinal bleed and upper endoscopy by Dr. Michail Sermon showed a large fungating ulcerated proximal gastric mass suspicious for malignancy.  The final pathology is still pending. I had a lengthy discussion with the patient today about her condition.  I recommended for her to hold any further treatment for multiple myeloma for now. I will wait for the final pathology from the gastric mass to give further recommendation about management of the suspicious malignancy. The patient is expected to be transferred to inpatient rehabilitation service. I will arrange for her a follow-up appointment with me after discharge for more detailed discussion of her treatment options. Thank you so much for taking good care of Ms. Pontillo. I will continue to follow up the patient with you and assist in her management on as-needed basis.  LOS: 7 days    [name] 03/10/2018

## 2018-03-10 NOTE — Progress Notes (Signed)
Subjective: The patient was seen and examined at bedside. She feels well, tolerating full liquid diet and requesting for diet to be advanced. Had a brown stool yesterday morning, none today. Has mild epigastric tenderness, otherwise no nausea or vomiting.  Objective: Vital signs in last 24 hours: Temp:  [98 F (36.7 C)-98.7 F (37.1 C)] 98 F (36.7 C) (02/24 0725) Pulse Rate:  [67-74] 70 (02/24 0725) Resp:  [16-18] 16 (02/24 0725) BP: (96-123)/(62-82) 96/68 (02/24 0725) SpO2:  [96 %-99 %] 96 % (02/24 0725) Weight change:  Last BM Date: 03/09/18  TS:[id number] pallor GENERAL:not in distress ABDOMEN:soft, mild epigastric tenderness, no distention, normoactive bowel sounds EXTREMITIES:No obvious swelling, mild tenderness over left leg  Lab Results: Results for orders placed or performed during the hospital encounter of 03/03/18 (from the past 48 hour(s))  CBC with Differential/Platelet     Status: Abnormal   Collection Time: 03/09/18  4:23 AM  Result Value Ref Range   WBC 3.3 (L) 4.0 - 10.5 K/uL   RBC 2.28 (L) 3.87 - 5.11 MIL/uL   Hemoglobin 7.2 (L) 12.0 - 15.0 g/dL   HCT 23.5 (L) 36.0 - 46.0 %   MCV 103.1 (H) 80.0 - 100.0 fL   MCH 31.6 26.0 - 34.0 pg   MCHC 30.6 30.0 - 36.0 g/dL   RDW 23.8 (H) 11.5 - 15.5 %   Platelets 67 (L) 150 - 400 K/uL    Comment: REPEATED TO VERIFY Immature Platelet Fraction may be clinically indicated, consider ordering this additional test [id number] CONSISTENT WITH PREVIOUS RESULT    nRBC 0.0 0.0 - 0.2 %   Neutrophils Relative % 48 %   Neutro Abs 1.6 (L) 1.7 - 7.7 K/uL   Lymphocytes Relative 25 %   Lymphs Abs 0.8 0.7 - 4.0 K/uL   Monocytes Relative 21 %   Monocytes Absolute 0.7 0.1 - 1.0 K/uL   Eosinophils Relative 4 %   Eosinophils Absolute 0.1 0.0 - 0.5 K/uL   Basophils Relative 1 %   Basophils Absolute 0.0 0.0 - 0.1 K/uL   Immature Granulocytes 1 %   Abs Immature Granulocytes 0.03 0.00 - 0.07 K/uL   Polychromasia PRESENT     Comment:  Performed at [facility], [address] [place], [place] [address]  Basic metabolic panel     Status: Abnormal   Collection Time: 03/09/18  4:23 AM  Result Value Ref Range   Sodium 137 135 - 145 mmol/L   Potassium 3.6 3.5 - 5.1 mmol/L   Chloride 105 98 - 111 mmol/L   CO2 23 22 - 32 mmol/L   Glucose, Bld 102 (H) 70 - 99 mg/dL   BUN 13 8 - 23 mg/dL   Creatinine, Ser 2.34 (H) 0.44 - 1.00 mg/dL   Calcium 8.3 (L) 8.9 - 10.3 mg/dL   GFR calc non Af Amer 20 (L) >60 mL/min   GFR calc Af Amer 23 (L) >60 mL/min   Anion gap 9 5 - 15    Comment: Performed at [facility], [place] [address], [place], [place] [address]  Protime-INR     Status: None   Collection Time: 03/09/18  4:23 AM  Result Value Ref Range   Prothrombin Time 13.2 11.4 - 15.2 seconds   INR 1.01     Comment: Performed at [facility] [address], [place], [place] [address]  Magnesium     Status: None   Collection Time: 03/10/18  6:08 AM  Result Value  Ref Range   Magnesium 2.2 1.7 - 2.4 mg/dL    Comment: Performed at [facility] [address] [place], [place] [address]   *Note: Due to a large number of results and/or encounters for the requested time period, some results have not been displayed. A complete set of results can be found in Results Review.    Studies/Results: Ct Abdomen Pelvis Wo Contrast  Result Date: 03/08/2018 CLINICAL DATA:  Large ulcerated gastric fundal mass found on upper endoscopy today. EXAM: CT CHEST, ABDOMEN AND PELVIS WITHOUT CONTRAST TECHNIQUE: Multidetector CT imaging of the chest, abdomen and pelvis was performed following the standard protocol without IV contrast. COMPARISON:  CT chest 01/20/2018 FINDINGS: CT CHEST FINDINGS Cardiovascular: The heart size is normal. No substantial pericardial effusion. Coronary artery calcification is evident. Atherosclerotic calcification is noted in the wall of the thoracic aorta. Right Port-A-Cath tip is positioned in the distal SVC  near the junction with the RA. Mediastinum/Nodes: 12 mm short axis prevascular node seen on 17/3. Scattered small central mediastinal lymph nodes identified in the upper mediastinum. 12 mm short axis retrotracheal node visible on 16/3. No subcarinal or lower paraesophageal lymphadenopathy. No evidence for gross hilar lymphadenopathy although assessment is limited by the lack of intravenous contrast on today's study. There is no axillary lymphadenopathy. Lungs/Pleura: The central tracheobronchial airways are patent. 19 mm ring-like lesion in the right upper lobe is stable and may represent airway impaction with associated tree-in-bud nodularity. 6 mm medial right upper lobe nodule visible on 67/4. 5 mm right middle lobe nodule visible on 102/4, stable. 1.9 x 1.2 cm irregular opacity in the posterior left upper lobe is at the site of the 4.2 x 3.8 cm mass seen on PET-CT of 10/11/2015 and may reflect residual scarring. Small left pleural effusion evident. Musculoskeletal: No worrisome lytic or sclerotic osseous abnormality. CT ABDOMEN PELVIS FINDINGS Hepatobiliary: No focal abnormality in the liver on this study without intravenous contrast. Gallbladder surgically absent. No intrahepatic or extrahepatic biliary dilation. Pancreas: No focal mass lesion. No dilatation of the main duct. No intraparenchymal cyst. No peripancreatic edema. Spleen: Subtle 2.7 x 2.0 cm hypoattenuating lesion is identified in the dome of spleen, not definitely seen on prior. Adrenals/Urinary Tract: Right adrenal gland unremarkable. 1.6 cm left adrenal nodule stable since PET-CT of 10/01/2015. Cortical scarring noted in both kidneys. No evidence for hydroureter. The urinary bladder appears normal for the degree of distention. Stomach/Bowel: Tiny hiatal hernia noted. Wall thickening is noted in the esophagogastric junction and proximal stomach. Wall thickening extends along the medial aspect of the proximal stomach. Distal stomach unremarkable.  Duodenum is normally positioned as is the ligament of Treitz. No small bowel wall thickening. No small bowel dilatation. The terminal ileum is normal. The appendix is not visualized, but there is no edema or inflammation in the region of the cecum. No gross colonic mass. No colonic wall thickening. Vascular/Lymphatic: There is abdominal aortic atherosclerosis without aneurysm. Bulky lymphadenopathy is seen in the hepato duodenal ligament. 3.2 x 4.5 cm lymph node seen on 55/3. The 2.9 x 2.8 cm lymph node seen on 57/3. 10 mm short axis portal caval lymph node (63/3) is suspicious. No para-aortic retroperitoneal lymphadenopathy. No pelvic sidewall lymphadenopathy. Reproductive: The uterus is surgically absent. There is no adnexal mass. Other: No intraperitoneal free fluid. Musculoskeletal: No worrisome lytic or sclerotic osseous abnormality. IMPRESSION: 1. Marked wall thickening in the esophagogastric junction and proximal stomach, consistent with known neoplasm. Bulky lymphadenopathy in the gastrohepatic ligament is  compatible with metastatic disease. 10 mm short axis portal caval lymph node also suspicious. 2. 2.7 cm hypoattenuating lesion in the spleen. This is not well assessed on today's noncontrast exam. Close attention recommended as metastatic disease not excluded. 3. Scattered tiny pulmonary nodules measuring up to 5 mm. Nonspecific, but close attention recommended as metastatic disease not excluded. 4. Mild lymphadenopathy noted in the upper mediastinum. 5. Circular lesion right upper lobe on axial imaging has a branching configuration on coronal and sagittal images. This is probably airway impaction related to atypical infection. Close follow-up recommended. 6. 1.9 x 1.2 cm irregular lesion posterior left upper lobe at the site of a 4 cm mass on PET-CT of 10/11/2015. Imaging features today are compatible with residua of that lesion. 7. 1.6 cm left adrenal nodule stable since 10/11/2015, likely benign. 8.   Aortic Atherosclerois (ICD10-170.0) Electronically Signed   By: Misty Stanley M.D.   On: 03/08/2018 19:19   Ct Chest Wo Contrast  Result Date: 03/08/2018 CLINICAL DATA:  Large ulcerated gastric fundal mass found on upper endoscopy today. EXAM: CT CHEST, ABDOMEN AND PELVIS WITHOUT CONTRAST TECHNIQUE: Multidetector CT imaging of the chest, abdomen and pelvis was performed following the standard protocol without IV contrast. COMPARISON:  CT chest 01/20/2018 FINDINGS: CT CHEST FINDINGS Cardiovascular: The heart size is normal. No substantial pericardial effusion. Coronary artery calcification is evident. Atherosclerotic calcification is noted in the wall of the thoracic aorta. Right Port-A-Cath tip is positioned in the distal SVC near the junction with the RA. Mediastinum/Nodes: 12 mm short axis prevascular node seen on 17/3. Scattered small central mediastinal lymph nodes identified in the upper mediastinum. 12 mm short axis retrotracheal node visible on 16/3. No subcarinal or lower paraesophageal lymphadenopathy. No evidence for gross hilar lymphadenopathy although assessment is limited by the lack of intravenous contrast on today's study. There is no axillary lymphadenopathy. Lungs/Pleura: The central tracheobronchial airways are patent. 19 mm ring-like lesion in the right upper lobe is stable and may represent airway impaction with associated tree-in-bud nodularity. 6 mm medial right upper lobe nodule visible on 67/4. 5 mm right middle lobe nodule visible on 102/4, stable. 1.9 x 1.2 cm irregular opacity in the posterior left upper lobe is at the site of the 4.2 x 3.8 cm mass seen on PET-CT of 10/11/2015 and may reflect residual scarring. Small left pleural effusion evident. Musculoskeletal: No worrisome lytic or sclerotic osseous abnormality. CT ABDOMEN PELVIS FINDINGS Hepatobiliary: No focal abnormality in the liver on this study without intravenous contrast. Gallbladder surgically absent. No intrahepatic or  extrahepatic biliary dilation. Pancreas: No focal mass lesion. No dilatation of the main duct. No intraparenchymal cyst. No peripancreatic edema. Spleen: Subtle 2.7 x 2.0 cm hypoattenuating lesion is identified in the dome of spleen, not definitely seen on prior. Adrenals/Urinary Tract: Right adrenal gland unremarkable. 1.6 cm left adrenal nodule stable since PET-CT of 10/01/2015. Cortical scarring noted in both kidneys. No evidence for hydroureter. The urinary bladder appears normal for the degree of distention. Stomach/Bowel: Tiny hiatal hernia noted. Wall thickening is noted in the esophagogastric junction and proximal stomach. Wall thickening extends along the medial aspect of the proximal stomach. Distal stomach unremarkable. Duodenum is normally positioned as is the ligament of Treitz. No small bowel wall thickening. No small bowel dilatation. The terminal ileum is normal. The appendix is not visualized, but there is no edema or inflammation in the region of the cecum. No gross colonic mass. No colonic wall thickening. Vascular/Lymphatic: There is abdominal aortic atherosclerosis  without aneurysm. Bulky lymphadenopathy is seen in the hepato duodenal ligament. 3.2 x 4.5 cm lymph node seen on 55/3. The 2.9 x 2.8 cm lymph node seen on 57/3. 10 mm short axis portal caval lymph node (63/3) is suspicious. No para-aortic retroperitoneal lymphadenopathy. No pelvic sidewall lymphadenopathy. Reproductive: The uterus is surgically absent. There is no adnexal mass. Other: No intraperitoneal free fluid. Musculoskeletal: No worrisome lytic or sclerotic osseous abnormality. IMPRESSION: 1. Marked wall thickening in the esophagogastric junction and proximal stomach, consistent with known neoplasm. Bulky lymphadenopathy in the gastrohepatic ligament is compatible with metastatic disease. 10 mm short axis portal caval lymph node also suspicious. 2. 2.7 cm hypoattenuating lesion in the spleen. This is not well assessed on today's  noncontrast exam. Close attention recommended as metastatic disease not excluded. 3. Scattered tiny pulmonary nodules measuring up to 5 mm. Nonspecific, but close attention recommended as metastatic disease not excluded. 4. Mild lymphadenopathy noted in the upper mediastinum. 5. Circular lesion right upper lobe on axial imaging has a branching configuration on coronal and sagittal images. This is probably airway impaction related to atypical infection. Close follow-up recommended. 6. 1.9 x 1.2 cm irregular lesion posterior left upper lobe at the site of a 4 cm mass on PET-CT of 10/11/2015. Imaging features today are compatible with residua of that lesion. 7. 1.6 cm left adrenal nodule stable since 10/11/2015, likely benign. 8.  Aortic Atherosclerois (ICD10-170.0) Electronically Signed   By: Misty Stanley M.D.   On: 03/08/2018 19:19   Ir Ivc Filter Plmt / S&i /img Guid/mod Sed  Result Date: 03/09/2018 INDICATION: Paradoxical embolus.  Calf vein DVT.  Pulmonary embolism. EXAM: IVC FILTER,INFERIOR VENA CAVOGRAM MEDICATIONS: None. ANESTHESIA/SEDATION: Fentanyl 75 mcg IV; Versed 1 mg IV Moderate Sedation Time:  17 minutes The patient was continuously monitored during the procedure by the interventional radiology nurse under my direct supervision. FLUOROSCOPY TIME:  Fluoroscopy Time: 3 minutes 30 seconds (31 mGy). COMPLICATIONS: None immediate. PROCEDURE: Informed written consent was obtained from the patient after a thorough discussion of the procedural risks, benefits and alternatives. All questions were addressed. Maximal Sterile Barrier Technique was utilized including caps, mask, sterile gowns, sterile gloves, sterile drape, hand hygiene and skin antiseptic. A timeout was performed prior to the initiation of the procedure. The right neck was prepped with Betadine in a sterile fashion, and a sterile drape was applied covering the operative field. A sterile gown and sterile gloves were used for the procedure. The  right jugular vein was noted to be patent initially with ultrasound. Under sonographic guidance, a micropuncture needle was inserted into the right jugular vein (Ultrasound image documentation was performed). It was removed over an 018 wire which was up-sized to a Evans. The sheath was inserted over the wire and into the IVC. IVC carbon dioxide venography was performed. The temporary Denali filter was then deployed in the infrarenal IVC. The sheath was removed and hemostasis was achieved with direct pressure. FINDINGS: IVC venography demonstrates renal inflow at the mid L2 level. There is no venous anomaly. There is no evidence of mega cava. Findings were corroborated with the accompanying CT abdomen. The final image demonstrates a deployed IVC filter with its tip at the lower L2 endplate. IMPRESSION: Successful infrarenal IVC filter placement. This is a temporary filter. It can be removed or remain in place to become permanent. PLAN: This IVC filter is potentially retrievable. The patient will be assessed for filter retrieval by Interventional Radiology in approximately 8-12 weeks. Further recommendations regarding filter  retrieval, continued surveillance or declaration of device permanence, will be made at that time. Electronically Signed   By: Marybelle Killings M.D.   On: 03/09/2018 12:15    Medications: I have reviewed the patient's current medications.  Assessment: Gastric fundus mass suspicious for neoplasm, biopsies pending Bulky lymphadenopathy in hepatoduodenal ligament compatible with metastatic disease  Acute left posterior tibial pain and left peroneal pain DVT, status post IVC filter placement, anticoagulation not started due to increased risk of bleeding  Multifocal bilateral, acute ischemic, likely embolic stroke with multifocal ischemia  Small patent forearm and no avail on TEE  History of multiple myeloma  Plan: Pathology pending, likely will be back by the end of the day. Surgical  evaluation likely to be performed as an outpatient. Patient currently on aspirin 325 mg a day Patient on sucralfate suspension 4 times a day and famotidine once a day,will switch to PPI once a day. Advance diet to mechanical soft.   Ronnette Juniper 03/10/2018, 11:06 AM   Pager [phone] If no answer or after 5 PM call [phone]

## 2018-03-10 NOTE — Care Management Important Message (Signed)
Important Message  Patient Details  Name: Nancy Norris MRN: [id number] Date of Birth: [birth year]   Medicare Important Message Given:  Yes    Orbie Pyo 03/10/2018, 4:14 PM

## 2018-03-10 NOTE — Progress Notes (Addendum)
PROGRESS NOTE  Nancy Norris YKD:[id number] DOB: [birth year] DOA: 03/03/2018 PCP: [name], MD  HPI/Recap of past 24 hours:   s/p IVC filter placement yesterday, denies pain, no fever Report poor appetite, felt dizzy and chest tightness at the end of PT session  She reports chronic dysphagia, no vomiting, no frank bleeding, stool is brown  Denies headache, she is aaox3 today     Assessment/Plan: Principal Problem:   Stroke [facility]) Active Problems:   HX: breast cancer   Hypothyroid   GERD (gastroesophageal reflux disease)   COPD (chronic obstructive pulmonary disease) with emphysema (HCC)   Multiple myeloma (HCC)   Thrombocytopenia (HCC)   Symptomatic anemia   Dysphagia   CKD (chronic kidney disease), stage IV (HCC)   Acute cardioembolic stroke ([place])   CKD (chronic kidney disease), stage III (HCC)   Acute DVT (deep venous thrombosis) (HCC)   Malignant neoplasm of stomach (HCC)  Acute bilateral multifocal CVA (presenting symptom): -patient presented with encaphlopathy/altered mental status -Multifocal bilateral acute ischemia within multiple vascular territories in both hemispheres. This may indicate a central cardiac or aortic embolic source. -MRA "Normal intracranial MRA." -has been sinus rhythm on tele -TEE planned on 2/21, will need venous doppler to r/o DVT if patient has PFO -a1c, ldl 85 -No antithrombotic prior to admission, now on aspirin 325 mg daily. Continue aspirin,  neurology signed off -She reports  Confusion (presenting symptom) almost resolved, speech is improving, but frustrated about her writing, she is lookingforward to go to CIR.    EGD concerning for gi cancer with high bleeding risk She is found to have acute DVT left lower extremity (Left: Findings consistent with acute deep vein thrombosis involving the left posterior tibial vein, and left peroneal vein. ) Case discussed with hematology/oncology Dr Alen Blew who  Recommend proceed with IVC  filter and cancer staging scan concerning for mets to lymphanode S/p retrievable inferior vena cava filter placement on 2/23  Dysphagia/Gastric cancer ( new diagnosis) -she does reports ongoing dysphagia for several months, ab pain when she eats in the last few months, she denies n/v, states stool is brown -MBS unremarkable,  -dg esophagus concerning for distal esophageal ulcer vs tumor -EGD on 2/22 showed "Large fungating malignant-appearing ulcerated proximal stomach mass that has areas of blood seen consistent with recent bleeding" pathology pending -[facility] input appreciated,   hematology/oncology consulted, case discussed with general surgery today as well   Pancytopenia  / multiple myeloma  -h/o bone marrow transplant in 2010 -case discussed with oncology Dr Julien Nordmann who states patient is getting prn treatment for MM to preserve renal function, multiple myeloma panel is stable -hematology/oncology formally consulted on 2/22 due to above issues    H/o breast cancer , s/p left mastectomy in 2008   Chronic kidney disease stage IV: H/o avf placement in 2015 With a baseline creatinine in 3-2.5 Continue outpatient follow up with nephrology    Chronic Hypotension:  Continue home meds  Midrodrine,  hold home meds lasix  COPD: Seems to be stable On anoro andsingulair  GERD: on carafate and zantac   Dry mouth: on pilocarpine  Hypocalcemia: s/p calcium gluconate , ca improved    Initial bcid + proteus and gpc Blood culture no growth, no fever, no leukocytosis D/c vanc,  D/c rocephin, change to keflex for 4days, to cover possible proteus due to h/o port cath and immunosuppressed status  NSVTx1  8beats of NSVT on tele on 2/23 am while she is awake, she is  asymptomatic Keep tele, keep K.4, mag 2.2  Dizziness: orthostatic hypotension, likely from dehydration, start hydration, check cardiac enzyme due to c/o chest tightness at time of dizziness   Addendum: troponin  elevated at 2, cardiology consult placed, case discussed with cardiology Will give one unit of prbc due to concerning for demand ischemia/NSTEMI Case discussed with oncology Dr Julien Nordmann who oked prbc transfusion, does not need irradiated or washed per Dr Julien Nordmann .  Code Status: DNR  Family Communication: patient   Disposition Plan:   CIR hopefully tomorrow     Consultants:  Neurology for acute cva  Cardiology for TEE  Oncology Dr Julien Nordmann, Dr Alen Blew for new diagnosis of gastric cancer /acute dvt/ multiple myeloma    [facility]  CIR  IR Dr Barbie Banner for ivc filter placement  Procedures:  TEE on 2/21  Dg esophagus on 2/20  MBS  on 2/20  EGD on 2/22  ivc filter placement on 2/23  Antibiotics:  Vanc/rocephin since admission to 2/21  Keflex from 2/21 to   Objective: BP 96/68 (BP Location: Right Arm)   Pulse 70   Temp 98 F (36.7 C) (Oral)   Resp 16   Ht '5\' 5"'$  (1.651 m)   Wt 78.8 kg   SpO2 96%   BMI 28.91 kg/m  No intake or output data in the 24 hours ending 03/10/18 0929 Filed Weights   03/07/18 0659 03/08/18 0730 03/09/18 0351  Weight: 79.3 kg 79.3 kg 78.8 kg    Exam: Patient is examined daily including today on 03/10/2018, exams remain the same as of yesterday except that has changed    General:  NAD, pale  Cardiovascular: RRR  Respiratory: CTABL  Abdomen: Soft/ND/NT, positive BS  Musculoskeletal: No Edema  Neuro: alert, oriented   Data Reviewed: Basic Metabolic Panel: Recent Labs  Lab 03/05/18 1106 03/06/18 0500 03/07/18 0550 03/08/18 0532 03/09/18 0423 03/10/18 0608  NA 138 140 140 140 137  --   K 3.5 4.8 3.3* 3.6 3.6  --   CL 106 106 106 108 105  --   CO2 20* 22 26 21* 23  --   GLUCOSE 119* 100* 101* 95 102*  --   BUN '18 17 16 14 13  '$ --   CREATININE 2.44* 2.56* 2.76* 2.47* 2.34*  --   CALCIUM 8.3* 6.1* 8.5* 8.2* 8.3*  --   MG  --   --   --   --   --  2.2   Liver Function Tests: Recent Labs  Lab 03/03/18 1243 03/04/18 0500    AST 16 15  ALT 9 9  ALKPHOS 83 61  BILITOT 1.1 0.4  PROT 5.6* 4.6*  ALBUMIN 3.5 2.7*   No results for input(s): LIPASE, AMYLASE in the last 168 hours. Recent Labs  Lab 03/03/18 1341  AMMONIA 10   CBC: Recent Labs  Lab 03/03/18 1243  03/05/18 1106 03/06/18 0500 03/07/18 0550 03/08/18 0532 03/09/18 0423  WBC 2.4*   < > 2.0* 2.0* 2.3* 2.7* 3.3*  NEUTROABS 1.1*  --  0.8* 0.8*  --  1.2* 1.6*  HGB 8.3*   < > 7.0* 7.2* 7.4* 7.3* 7.2*  HCT 26.8*   < > 23.0* 22.1* 23.3* 24.1* 23.5*  MCV 104.7*   < > 103.1* 101.4* 102.6* 103.4* 103.1*  PLT 54*   < > 62* 66* 72* 72* 67*   < > = values in this interval not displayed.   Cardiac Enzymes:   No results for input(s): CKTOTAL, CKMB,  CKMBINDEX, TROPONINI in the last 168 hours. BNP (last 3 results) Recent Labs    03/13/17 1212 01/20/18 1949  BNP 77 102.6*    ProBNP (last 3 results) No results for input(s): PROBNP in the last 8760 hours.  CBG: No results for input(s): GLUCAP in the last 168 hours.  Recent Results (from the past 240 hour(s))  Culture, blood (Routine x 2)     Status: None   Collection Time: 03/03/18 12:27 PM  Result Value Ref Range Status   Specimen Description BLOOD PORTA CATH  Final   Special Requests   Final    BOTTLES DRAWN AEROBIC AND ANAEROBIC Blood Culture adequate volume Performed at [facility] [address], [place], [place] [address]    Culture NO GROWTH 5 DAYS  Final   Report Status 03/08/2018 FINAL  Final  Culture, blood (Routine x 2)     Status: Abnormal   Collection Time: 03/03/18  1:41 PM  Result Value Ref Range Status   Specimen Description   Final    BLOOD RIGHT ANTECUBITAL Performed at [facility], [place] [address] [place], [place] [address]    Special Requests   Final    BOTTLES DRAWN AEROBIC AND ANAEROBIC Blood Culture adequate volume Performed at [facility] [address], [place], [place] [address]    Culture  Setup Time   Final     GRAM POSITIVE COCCI ANAEROBIC BOTTLE ONLY CRITICAL RESULT CALLED TO, READ BACK BY AND VERIFIED WITH: Jene Every PHARMD [id number] 03/04/18 A BROWNING Performed at [facility], [place] [address], [place], [place] [address]    Culture (A)  Final    STAPHYLOCOCCUS SPECIES (COAGULASE NEGATIVE) DIPHTHEROIDS(CORYNEBACTERIUM SPECIES) THE SIGNIFICANCE OF ISOLATING THIS ORGANISM FROM A SINGLE SET OF BLOOD CULTURES WHEN MULTIPLE SETS ARE DRAWN IS UNCERTAIN. PLEASE NOTIFY THE MICROBIOLOGY DEPARTMENT WITHIN ONE WEEK IF SPECIATION AND SENSITIVITIES ARE REQUIRED. CRITICAL RESULT CALLED TO, READ BACK BY AND VERIFIED WITH: Leafy Half [id number] FCP    Report Status 03/09/2018 FINAL  Final  Blood Culture ID Panel (Reflexed)     Status: Abnormal   Collection Time: 03/03/18  1:41 PM  Result Value Ref Range Status   Enterococcus species NOT DETECTED NOT DETECTED Final   Listeria monocytogenes NOT DETECTED NOT DETECTED Final   Staphylococcus species DETECTED (A) NOT DETECTED Final    Comment: Methicillin (oxacillin) resistant coagulase negative staphylococcus. Possible blood culture contaminant (unless isolated from more than one blood culture draw or clinical case suggests pathogenicity). No antibiotic treatment is indicated for blood  culture contaminants. CRITICAL RESULT CALLED TO, READ BACK BY AND VERIFIED WITH: Jene Every PHARMD 1840 03/04/18 A BROWNING    Staphylococcus aureus (BCID) NOT DETECTED NOT DETECTED Final   Methicillin resistance DETECTED (A) NOT DETECTED Final    Comment: CRITICAL RESULT CALLED TO, READ BACK BY AND VERIFIED WITH: Jene Every PHARMD 1840 03/04/18 A BROWNING    Streptococcus species NOT DETECTED NOT DETECTED Final   Streptococcus agalactiae NOT DETECTED NOT DETECTED Final   Streptococcus pneumoniae NOT DETECTED NOT DETECTED Final   Streptococcus pyogenes NOT DETECTED NOT DETECTED Final   Acinetobacter baumannii NOT DETECTED NOT DETECTED Final   Enterobacteriaceae species DETECTED (A) NOT  DETECTED Final    Comment: Enterobacteriaceae represent a large family of gram-negative bacteria, not a single organism. CRITICAL RESULT CALLED TO, READ BACK BY AND VERIFIED WITH: Jerilynn Mages [facility] PHARMD 1840 03/04/18 A BROWNING    Enterobacter cloacae complex NOT DETECTED  NOT DETECTED Final   Escherichia coli NOT DETECTED NOT DETECTED Final   Klebsiella oxytoca NOT DETECTED NOT DETECTED Final   Klebsiella pneumoniae NOT DETECTED NOT DETECTED Final   Proteus species DETECTED (A) NOT DETECTED Final    Comment: CRITICAL RESULT CALLED TO, READ BACK BY AND VERIFIED WITH: Jene Every PHARMD 1840 03/04/18 A BROWNING    Serratia marcescens NOT DETECTED NOT DETECTED Final   Carbapenem resistance NOT DETECTED NOT DETECTED Final   Haemophilus influenzae NOT DETECTED NOT DETECTED Final   Neisseria meningitidis NOT DETECTED NOT DETECTED Final   Pseudomonas aeruginosa NOT DETECTED NOT DETECTED Final   Candida albicans NOT DETECTED NOT DETECTED Final   Candida glabrata NOT DETECTED NOT DETECTED Final   Candida krusei NOT DETECTED NOT DETECTED Final   Candida parapsilosis NOT DETECTED NOT DETECTED Final   Candida tropicalis NOT DETECTED NOT DETECTED Final    Comment: Performed at [facility], [place] [address], [place], [place] [address]     Studies: No results found.  Scheduled Meds: .  stroke: mapping our early stages of recovery book   Does not apply Once  . sodium chloride  250 mL Intravenous Once  . aspirin  325 mg Oral Daily  . atorvastatin  20 mg Oral q1800  . buPROPion  75 mg Oral BID  . cephALEXin  250 mg Oral Q8H  . citalopram  40 mg Oral Daily  . famotidine  20 mg Oral Daily  . folic acid  1 mg Oral Daily  . gabapentin  600 mg Oral QHS  . levothyroxine  150 mcg Oral Q0600  . loratadine  10 mg Oral Daily  . midodrine  10 mg Oral Daily  . montelukast  10 mg Oral QHS  . pilocarpine  5 mg Oral BID  . sodium chloride flush  3 mL Intravenous Once  . sucralfate  1 g Oral TID WC & HS  . vitamin  B-12  1,000 mcg Oral Daily    Continuous Infusions: . lactated ringers    . lactated ringers       Time spent: 21mns, case discussed with general surgery I have personally reviewed and interpreted on  03/10/2018 daily labs, imagings as discussed above under date review session and assessment and plans.  I reviewed all nursing notes, pharmacy notes, consultant notes,  vitals, pertinent old records  I have discussed plan of care as described above with RN , patient  on 03/10/2018   FFlorencia ReasonsMD, PhD  [facility] Pager [phone] If 7PM-7AM, please contact night-coverage at [email or web address], password [email or web address]/24/2020, 9:29 AM  LOS: 7 days

## 2018-03-10 NOTE — Progress Notes (Signed)
CRITICAL VALUE ALERT  Critical Value:  Troponin 2.06  Date & Time Notied:  03/10/18 @ 1229  Provider Notified: Dr. Dillard Cannon  Orders Received/Actions taken: MD calling Cardiology

## 2018-03-10 NOTE — Progress Notes (Signed)
  Speech Language Pathology Treatment: Cognitive-Linquistic  Patient Details Name: Nancy Norris MRN: [id number] DOB: [birth year] Today's Date: 03/10/2018 Time: 2025-4270 SLP Time Calculation (min) (ACUTE ONLY): 35 min  Assessment / Plan / Recommendation Clinical Impression  Pt was seen for treatment and reported that she feels that her mind has been more clear but she questions how long she has been having cognitive difficulty since she stopped driving five years prior due to her growing concerns of becoming easily distracted. She demonstrated 75% accuracy with money management (menu) task increasing to 100% accuracy with minimal-moderate cues. She achieved 33% accuracy with time problems increasing to 100% accuracy with moderate cues. Working memory tasks were completed with 80% accuracy increasing to 100% with minimal cues.    HPI HPI: Patient is a 78 y/o female presents with confusion, amnesia and HA. Head CT- left occipital and left frontal parietial infarcts. MRI- multiple posterior/anterior bilateral infarcts. PMH includes multiple myeloma currently getting chemo, MI, COPD, hypotension, breast ca, fibromyalgia.       SLP Plan  Continue with current plan of care       Recommendations  Liquids provided via: Cup;Straw Medication Administration: Whole meds with puree Supervision: Patient able to self feed Compensations: Small sips/bites;Slow rate;Follow solids with liquid                Oral Care Recommendations: Oral care BID Follow up Recommendations: Inpatient Rehab Plan: Continue with current plan of care       Nancy Norris I. Hardin Negus, [place], [place] Office number [phone] Pager [place] 03/10/2018, 11:39 AM

## 2018-03-11 ENCOUNTER — Other Ambulatory Visit: Payer: Self-pay

## 2018-03-11 ENCOUNTER — Encounter (HOSPITAL_COMMUNITY): Payer: Self-pay | Admitting: Gastroenterology

## 2018-03-11 ENCOUNTER — Inpatient Hospital Stay (HOSPITAL_COMMUNITY)
Admission: RE | Admit: 2018-03-11 | Discharge: 2018-03-12 | DRG: 091 | Disposition: A | Discharge status: Other Acute Inpatient Hospital | Payer: Medicare Other | Source: Intra-hospital | Attending: Physical Medicine & Rehabilitation | Admitting: Physical Medicine & Rehabilitation

## 2018-03-11 DIAGNOSIS — D61818 Other pancytopenia: Secondary | ICD-10-CM

## 2018-03-11 DIAGNOSIS — Z90710 Acquired absence of both cervix and uterus: Secondary | ICD-10-CM

## 2018-03-11 DIAGNOSIS — R059 Cough, unspecified: Secondary | ICD-10-CM

## 2018-03-11 DIAGNOSIS — Z9012 Acquired absence of left breast and nipple: Secondary | ICD-10-CM

## 2018-03-11 DIAGNOSIS — F329 Major depressive disorder, single episode, unspecified: Secondary | ICD-10-CM | POA: Diagnosis present

## 2018-03-11 DIAGNOSIS — R7989 Other specified abnormal findings of blood chemistry: Secondary | ICD-10-CM | POA: Diagnosis present

## 2018-03-11 DIAGNOSIS — Z91041 Radiographic dye allergy status: Secondary | ICD-10-CM

## 2018-03-11 DIAGNOSIS — Z79890 Hormone replacement therapy (postmenopausal): Secondary | ICD-10-CM

## 2018-03-11 DIAGNOSIS — E785 Hyperlipidemia, unspecified: Secondary | ICD-10-CM

## 2018-03-11 DIAGNOSIS — R29810 Facial weakness: Secondary | ICD-10-CM | POA: Diagnosis not present

## 2018-03-11 DIAGNOSIS — J449 Chronic obstructive pulmonary disease, unspecified: Secondary | ICD-10-CM | POA: Diagnosis present

## 2018-03-11 DIAGNOSIS — I129 Hypertensive chronic kidney disease with stage 1 through stage 4 chronic kidney disease, or unspecified chronic kidney disease: Secondary | ICD-10-CM | POA: Diagnosis present

## 2018-03-11 DIAGNOSIS — Z885 Allergy status to narcotic agent status: Secondary | ICD-10-CM

## 2018-03-11 DIAGNOSIS — K219 Gastro-esophageal reflux disease without esophagitis: Secondary | ICD-10-CM | POA: Diagnosis present

## 2018-03-11 DIAGNOSIS — Z95828 Presence of other vascular implants and grafts: Secondary | ICD-10-CM

## 2018-03-11 DIAGNOSIS — I82452 Acute embolism and thrombosis of left peroneal vein: Secondary | ICD-10-CM

## 2018-03-11 DIAGNOSIS — Z853 Personal history of malignant neoplasm of breast: Secondary | ICD-10-CM

## 2018-03-11 DIAGNOSIS — Z66 Do not resuscitate: Secondary | ICD-10-CM | POA: Diagnosis present

## 2018-03-11 DIAGNOSIS — C9000 Multiple myeloma not having achieved remission: Secondary | ICD-10-CM | POA: Diagnosis present

## 2018-03-11 DIAGNOSIS — Z87891 Personal history of nicotine dependence: Secondary | ICD-10-CM

## 2018-03-11 DIAGNOSIS — Z79899 Other long term (current) drug therapy: Secondary | ICD-10-CM

## 2018-03-11 DIAGNOSIS — Z91018 Allergy to other foods: Secondary | ICD-10-CM

## 2018-03-11 DIAGNOSIS — I63512 Cerebral infarction due to unspecified occlusion or stenosis of left middle cerebral artery: Secondary | ICD-10-CM | POA: Diagnosis not present

## 2018-03-11 DIAGNOSIS — I252 Old myocardial infarction: Secondary | ICD-10-CM

## 2018-03-11 DIAGNOSIS — Z888 Allergy status to other drugs, medicaments and biological substances status: Secondary | ICD-10-CM

## 2018-03-11 DIAGNOSIS — Q211 Atrial septal defect: Secondary | ICD-10-CM | POA: Diagnosis not present

## 2018-03-11 DIAGNOSIS — I82442 Acute embolism and thrombosis of left tibial vein: Secondary | ICD-10-CM | POA: Diagnosis present

## 2018-03-11 DIAGNOSIS — G8191 Hemiplegia, unspecified affecting right dominant side: Secondary | ICD-10-CM | POA: Diagnosis not present

## 2018-03-11 DIAGNOSIS — R29721 NIHSS score 21: Secondary | ICD-10-CM | POA: Diagnosis not present

## 2018-03-11 DIAGNOSIS — M797 Fibromyalgia: Secondary | ICD-10-CM | POA: Diagnosis present

## 2018-03-11 DIAGNOSIS — R911 Solitary pulmonary nodule: Secondary | ICD-10-CM | POA: Diagnosis present

## 2018-03-11 DIAGNOSIS — I951 Orthostatic hypotension: Secondary | ICD-10-CM | POA: Diagnosis present

## 2018-03-11 DIAGNOSIS — C9001 Multiple myeloma in remission: Secondary | ICD-10-CM | POA: Diagnosis present

## 2018-03-11 DIAGNOSIS — R2689 Other abnormalities of gait and mobility: Secondary | ICD-10-CM | POA: Diagnosis present

## 2018-03-11 DIAGNOSIS — E039 Hypothyroidism, unspecified: Secondary | ICD-10-CM | POA: Diagnosis present

## 2018-03-11 DIAGNOSIS — K319 Disease of stomach and duodenum, unspecified: Secondary | ICD-10-CM | POA: Diagnosis present

## 2018-03-11 DIAGNOSIS — I5032 Chronic diastolic (congestive) heart failure: Secondary | ICD-10-CM

## 2018-03-11 DIAGNOSIS — Z91048 Other nonmedicinal substance allergy status: Secondary | ICD-10-CM

## 2018-03-11 DIAGNOSIS — D631 Anemia in chronic kidney disease: Secondary | ICD-10-CM | POA: Diagnosis present

## 2018-03-11 DIAGNOSIS — R4701 Aphasia: Secondary | ICD-10-CM | POA: Diagnosis not present

## 2018-03-11 DIAGNOSIS — I639 Cerebral infarction, unspecified: Secondary | ICD-10-CM | POA: Diagnosis present

## 2018-03-11 DIAGNOSIS — J441 Chronic obstructive pulmonary disease with (acute) exacerbation: Secondary | ICD-10-CM

## 2018-03-11 DIAGNOSIS — I959 Hypotension, unspecified: Secondary | ICD-10-CM

## 2018-03-11 DIAGNOSIS — K224 Dyskinesia of esophagus: Secondary | ICD-10-CM | POA: Diagnosis present

## 2018-03-11 DIAGNOSIS — F419 Anxiety disorder, unspecified: Secondary | ICD-10-CM | POA: Diagnosis present

## 2018-03-11 DIAGNOSIS — Z881 Allergy status to other antibiotic agents status: Secondary | ICD-10-CM

## 2018-03-11 DIAGNOSIS — Z9049 Acquired absence of other specified parts of digestive tract: Secondary | ICD-10-CM

## 2018-03-11 DIAGNOSIS — I214 Non-ST elevation (NSTEMI) myocardial infarction: Secondary | ICD-10-CM

## 2018-03-11 DIAGNOSIS — N184 Chronic kidney disease, stage 4 (severe): Secondary | ICD-10-CM | POA: Diagnosis present

## 2018-03-11 DIAGNOSIS — R471 Dysarthria and anarthria: Secondary | ICD-10-CM | POA: Diagnosis not present

## 2018-03-11 DIAGNOSIS — R05 Cough: Secondary | ICD-10-CM

## 2018-03-11 DIAGNOSIS — R591 Generalized enlarged lymph nodes: Secondary | ICD-10-CM | POA: Diagnosis present

## 2018-03-11 DIAGNOSIS — Z91040 Latex allergy status: Secondary | ICD-10-CM

## 2018-03-11 DIAGNOSIS — Z9221 Personal history of antineoplastic chemotherapy: Secondary | ICD-10-CM

## 2018-03-11 LAB — CBC WITH DIFFERENTIAL/PLATELET
ABS IMMATURE GRANULOCYTES: 0.1 10*3/uL — AB (ref 0.00–0.07)
Basophils Absolute: 0 10*3/uL (ref 0.0–0.1)
Basophils Relative: 1 %
Eosinophils Absolute: 0.2 10*3/uL (ref 0.0–0.5)
Eosinophils Relative: 6 %
HCT: 26.9 % — ABNORMAL LOW (ref 36.0–46.0)
Hemoglobin: 8.6 g/dL — ABNORMAL LOW (ref 12.0–15.0)
Lymphocytes Relative: 23 %
Lymphs Abs: 0.9 10*3/uL (ref 0.7–4.0)
MCH: 32.5 pg (ref 26.0–34.0)
MCHC: 32 g/dL (ref 30.0–36.0)
MCV: 101.5 fL — ABNORMAL HIGH (ref 80.0–100.0)
Monocytes Absolute: 0.1 10*3/uL (ref 0.1–1.0)
Monocytes Relative: 4 %
Neutro Abs: 2.4 10*3/uL (ref 1.7–7.7)
Neutrophils Relative %: 64 %
Platelets: 52 10*3/uL — ABNORMAL LOW (ref 150–400)
Promyelocytes Relative: 2 %
RBC: 2.65 MIL/uL — AB (ref 3.87–5.11)
RDW: 22.2 % — ABNORMAL HIGH (ref 11.5–15.5)
WBC: 3.7 10*3/uL — ABNORMAL LOW (ref 4.0–10.5)
nRBC: 0 % (ref 0.0–0.2)
nRBC: 1 /100 WBC — ABNORMAL HIGH

## 2018-03-11 LAB — TYPE AND SCREEN
ABO/RH(D): O POS
ANTIBODY SCREEN: NEGATIVE
Unit division: 0

## 2018-03-11 LAB — BASIC METABOLIC PANEL
Anion gap: 5 (ref 5–15)
BUN: 13 mg/dL (ref 8–23)
CHLORIDE: 108 mmol/L (ref 98–111)
CO2: 27 mmol/L (ref 22–32)
Calcium: 8.8 mg/dL — ABNORMAL LOW (ref 8.9–10.3)
Creatinine, Ser: 2.13 mg/dL — ABNORMAL HIGH (ref 0.44–1.00)
GFR calc Af Amer: 25 mL/min — ABNORMAL LOW (ref >60)
GFR calc non Af Amer: 22 mL/min — ABNORMAL LOW (ref >60)
Glucose, Bld: 105 mg/dL — ABNORMAL HIGH (ref 70–99)
Potassium: 3.9 mmol/L (ref 3.5–5.1)
SODIUM: 140 mmol/L (ref 135–145)

## 2018-03-11 LAB — BPAM RBC
Blood Product Expiration Date: 202003222359
ISSUE DATE / TIME: 202002242023
Unit Type and Rh: 5100

## 2018-03-11 LAB — TROPONIN I: Troponin I: 2.15 ng/mL (ref <0.03)

## 2018-03-11 MED ORDER — MIDODRINE HCL 5 MG PO TABS
10.0000 mg | ORAL_TABLET | Freq: Every day | ORAL | Status: DC
  Administered 2018-03-12: 10 mg via ORAL
  Filled 2018-03-11: qty 2

## 2018-03-11 MED ORDER — MONTELUKAST SODIUM 10 MG PO TABS
10.0000 mg | ORAL_TABLET | Freq: Every day | ORAL | Status: DC
  Administered 2018-03-11: 10 mg via ORAL
  Filled 2018-03-11: qty 1

## 2018-03-11 MED ORDER — LORATADINE 10 MG PO TABS
10.0000 mg | ORAL_TABLET | Freq: Every day | ORAL | Status: DC
  Administered 2018-03-12: 10 mg via ORAL
  Filled 2018-03-11: qty 1

## 2018-03-11 MED ORDER — ASPIRIN EC 325 MG PO TBEC: 325.0000 mg | DELAYED_RELEASE_TABLET | Freq: Every day | ORAL | 0 refills | Status: DC

## 2018-03-11 MED ORDER — PANTOPRAZOLE SODIUM 40 MG PO TBEC
40.0000 mg | DELAYED_RELEASE_TABLET | Freq: Every day | ORAL | Status: DC
  Administered 2018-03-12: 40 mg via ORAL
  Filled 2018-03-11: qty 1

## 2018-03-11 MED ORDER — GABAPENTIN 300 MG PO CAPS: 300.0000 mg | ORAL_CAPSULE | Freq: Every day | ORAL | Status: DC | PRN

## 2018-03-11 MED ORDER — BUPROPION HCL 75 MG PO TABS
75.0000 mg | ORAL_TABLET | Freq: Two times a day (BID) | ORAL | Status: DC
  Administered 2018-03-11 – 2018-03-12 (×2): 75 mg via ORAL
  Filled 2018-03-11 (×3): qty 1

## 2018-03-11 MED ORDER — VITAMIN B-12 1000 MCG PO TABS
1000.0000 ug | ORAL_TABLET | Freq: Every day | ORAL | Status: DC
  Administered 2018-03-12: 1000 ug via ORAL
  Filled 2018-03-11: qty 1

## 2018-03-11 MED ORDER — ATORVASTATIN CALCIUM 10 MG PO TABS
20.0000 mg | ORAL_TABLET | Freq: Every day | ORAL | Status: DC
  Administered 2018-03-11: 20 mg via ORAL
  Filled 2018-03-11: qty 2

## 2018-03-11 MED ORDER — PILOCARPINE HCL 5 MG PO TABS
5.0000 mg | ORAL_TABLET | Freq: Two times a day (BID) | ORAL | Status: DC
  Administered 2018-03-11 – 2018-03-12 (×2): 5 mg via ORAL
  Filled 2018-03-11 (×3): qty 1

## 2018-03-11 MED ORDER — ALBUTEROL SULFATE (2.5 MG/3ML) 0.083% IN NEBU: 3.0000 mL | INHALATION_SOLUTION | Freq: Four times a day (QID) | RESPIRATORY_TRACT | Status: DC | PRN

## 2018-03-11 MED ORDER — ACETAMINOPHEN 325 MG PO TABS: 650.0000 mg | ORAL_TABLET | Freq: Four times a day (QID) | ORAL | Status: DC | PRN

## 2018-03-11 MED ORDER — ATORVASTATIN CALCIUM 20 MG PO TABS: 20.0000 mg | ORAL_TABLET | Freq: Every day | ORAL | Status: DC

## 2018-03-11 MED ORDER — ENSURE ENLIVE PO LIQD: 237.0000 mL | Freq: Two times a day (BID) | ORAL | 12 refills | Status: DC

## 2018-03-11 MED ORDER — ACETAMINOPHEN 650 MG RE SUPP: 650.0000 mg | Freq: Four times a day (QID) | RECTAL | Status: DC | PRN

## 2018-03-11 MED ORDER — ACETAMINOPHEN 500 MG PO TABS: 1000.0000 mg | ORAL_TABLET | Freq: Every day | ORAL | 0 refills | Status: DC | PRN

## 2018-03-11 MED ORDER — TRAMADOL HCL 50 MG PO TABS: 50.0000 mg | ORAL_TABLET | Freq: Four times a day (QID) | ORAL | Status: DC | PRN

## 2018-03-11 MED ORDER — CITALOPRAM HYDROBROMIDE 20 MG PO TABS
40.0000 mg | ORAL_TABLET | Freq: Every day | ORAL | Status: DC
  Administered 2018-03-12: 40 mg via ORAL
  Filled 2018-03-11: qty 2

## 2018-03-11 MED ORDER — PANTOPRAZOLE SODIUM 40 MG PO TBEC: 40.0000 mg | DELAYED_RELEASE_TABLET | Freq: Every day | ORAL | Status: AC

## 2018-03-11 MED ORDER — ENSURE ENLIVE PO LIQD
237.0000 mL | Freq: Two times a day (BID) | ORAL | Status: DC
  Administered 2018-03-11 – 2018-03-12 (×2): 237 mL via ORAL

## 2018-03-11 MED ORDER — LEVOTHYROXINE SODIUM 75 MCG PO TABS
150.0000 ug | ORAL_TABLET | Freq: Every day | ORAL | Status: DC
  Administered 2018-03-12: 150 ug via ORAL
  Filled 2018-03-11: qty 2

## 2018-03-11 MED ORDER — FOLIC ACID 1 MG PO TABS
1.0000 mg | ORAL_TABLET | Freq: Every day | ORAL | Status: DC
  Administered 2018-03-12: 1 mg via ORAL
  Filled 2018-03-11: qty 1

## 2018-03-11 MED ORDER — CROMOLYN SODIUM 4 % OP SOLN
1.0000 [drp] | Freq: Four times a day (QID) | OPHTHALMIC | Status: DC | PRN
  Filled 2018-03-11: qty 10

## 2018-03-11 MED ORDER — SORBITOL 70 % SOLN: 30.0000 mL | Freq: Every day | Status: DC | PRN

## 2018-03-11 MED ORDER — SUCRALFATE 1 GM/10ML PO SUSP
1.0000 g | Freq: Three times a day (TID) | ORAL | Status: DC
  Administered 2018-03-11 – 2018-03-12 (×3): 1 g via ORAL
  Filled 2018-03-11 (×6): qty 10

## 2018-03-11 MED ORDER — GABAPENTIN 300 MG PO CAPS
600.0000 mg | ORAL_CAPSULE | Freq: Every day | ORAL | Status: DC
  Administered 2018-03-11: 600 mg via ORAL
  Filled 2018-03-11: qty 2

## 2018-03-11 MED ORDER — ASPIRIN 325 MG PO TABS
325.0000 mg | ORAL_TABLET | Freq: Every day | ORAL | Status: DC
  Administered 2018-03-12: 325 mg via ORAL
  Filled 2018-03-11: qty 1

## 2018-03-11 NOTE — Progress Notes (Signed)
Patient transferred to 4W 25.  All belongings sent with patient.  Telemetry removed.

## 2018-03-11 NOTE — Progress Notes (Addendum)
Progress Note  Patient Name: Nancy Norris Date of Encounter: 03/11/2018  Primary Cardiologist: Jenkins Rouge, MD   Subjective   Patient feeling well this AM. She was able to ambulate with PT today and denies CP or SHOB during the session. Overall feels well. Anxious about the biopsy results. Discuss that from a cardiac standpoint we will change some medications. All questions and concerns addressed.   Inpatient Medications    Scheduled Meds: .  stroke: mapping our early stages of recovery book   Does not apply Once  . sodium chloride  250 mL Intravenous Once  . aspirin  325 mg Oral Daily  . atorvastatin  20 mg Oral q1800  . buPROPion  75 mg Oral BID  . cephALEXin  250 mg Oral Q8H  . citalopram  40 mg Oral Daily  . feeding supplement (ENSURE ENLIVE)  237 mL Oral BID BM  . folic acid  1 mg Oral Daily  . gabapentin  600 mg Oral QHS  . levothyroxine  150 mcg Oral Q0600  . loratadine  10 mg Oral Daily  . midodrine  10 mg Oral Daily  . montelukast  10 mg Oral QHS  . pantoprazole  40 mg Oral Daily  . pilocarpine  5 mg Oral BID  . sodium chloride flush  3 mL Intravenous Once  . sucralfate  1 g Oral TID WC & HS  . vitamin B-12  1,000 mcg Oral Daily   Continuous Infusions: . lactated ringers 75 mL/hr at 03/10/18 2336   PRN Meds: acetaminophen **OR** acetaminophen, albuterol, cromolyn, gabapentin, ondansetron (ZOFRAN) IV, traMADol   Vital Signs    Vitals:   03/11/18 0351 03/11/18 0500 03/11/18 0833 03/11/18 0900  BP: (!) 107/53  109/77   Pulse: 62  75   Resp: 17  16   Temp: 98.2 F (36.8 C)  98.7 F (37.1 C)   TempSrc: Oral  Oral   SpO2: 96%     Weight:  80.4 kg    Height:    '5\' 5"'$  (1.651 m)    Intake/Output Summary (Last 24 hours) at 03/11/2018 1026 Last data filed at 03/10/2018 2334 Gross per 24 hour  Intake 820.96 ml  Output 3 ml  Net 817.96 ml   Filed Weights   03/08/18 0730 03/09/18 0351 03/11/18 0500  Weight: 79.3 kg 78.8 kg 80.4 kg   Telemetry      NSR- Personally Reviewed  ECG    No new EKG  Physical Exam   GEN: No acute distress.   Neck: No JVD Cardiac: RRR, no murmurs, rubs, or gallops.  Respiratory: Clear to auscultation bilaterally. GI: Soft, nontender, non-distended  MS: No edema; No deformity. Neuro:  Nonfocal  Psych: Normal affect   Labs    Chemistry Recent Labs  Lab 03/07/18 0550 03/08/18 0532 03/09/18 0423  NA 140 140 137  K 3.3* 3.6 3.6  CL 106 108 105  CO2 26 21* 23  GLUCOSE 101* 95 102*  BUN '16 14 13  '$ CREATININE 2.76* 2.47* 2.34*  CALCIUM 8.5* 8.2* 8.3*  GFRNONAA 16* 18* 20*  GFRAA 19* 21* 23*  ANIONGAP '8 11 9    '$ Hematology Recent Labs  Lab 03/08/18 0532 03/09/18 0423 03/11/18 0500  WBC 2.7* 3.3* 3.7*  RBC 2.33* 2.28* 2.65*  HGB 7.3* 7.2* 8.6*  HCT 24.1* 23.5* 26.9*  MCV 103.4* 103.1* 101.5*  MCH 31.3 31.6 32.5  MCHC 30.3 30.6 32.0  RDW 24.2* 23.8* 22.2*  PLT 72* 67* 52*  Cardiac Enzymes Recent Labs  Lab 03/10/18 1123 03/10/18 1735 03/10/18 2353  TROPONINI 2.06* 2.43* 2.15*   No results for input(s): TROPIPOC in the last 168 hours.   BNPNo results for input(s): BNP, PROBNP in the last 168 hours.   DDimer No results for input(s): DDIMER in the last 168 hours.   Radiology    No results found.  Cardiac Studies   TTE 03/10/2018  1. The left ventricle has normal systolic function, with an ejection fraction of 60-65%. The cavity size was normal. indeterminate.  2. The right ventricle has normal systolc function. The cavity was normal. There is no increase in right ventricular wall thickness.  3. The mitral valve is normal in structure. Mild thickening of the mitral valve leaflet.  4. The tricuspid valve was normal in structure.  5. The pulmonic valve was normal in structure.  Patient Profile     Nancy Norris is a 78 y.o. female with a history of multiple myeloma with pancytopenia, postural hypotension, fibromyalgia, COPD, and CKD stage IV followed by outpatient  Nephrology, who is being seen for the evaluation of elevated troponin at the request of Dr. Erlinda Hong.  Assessment & Plan    NSTEMI - Ambulated today without CP or SHOB  - Troponin peak at 2.4  - No new EKG but tele unremarkable  - Echo unchanged compared to prior  - Continue ASA and Atorvastatin  - Orthostatic hypotension limits the use of BB and ACE/ARB  - GI bleed limiting the use of IV heparin  - Patient high risk for cardiac cath with comorbidies and renal function  - Plan is to go to CIR today per primary. Will continue medical management and arrange outpatient follow-up.    Orthostatic Hypotension - Continue home Midodrine.  - Currently receiving IV fluids.  Will discuss the case further with Dr. Irish Lack.   For questions or updates, please contact [place] Please consult [email or web address] for contact info under Cardiology/STEMI.   Signed, Ina Homes, MD  03/11/2018, 10:26 AM    I have examined the patient and reviewed assessment and plan and discussed with patient.  Agree with above as stated.  She walked today without difficulty.  No chest discomfort.  Cardiac treatment options are limited due to renal insufficiency, bleeding issues and possible gastric malignancy.  Await biopsy results.  Further cardiac plans based on oncology plan.  She is asymptomatic from a cardiac standpoint at this time.   Larae Grooms

## 2018-03-11 NOTE — Progress Notes (Signed)
Patient was admitted to IR with all personal belongings. Patients questions were answered and patient states she has a "headache" with pain being a 10 at times, states she " would like to rest for awhile". Patient is resting in bed with call bell within reach and bed alarm on. Doy Hutching. LPN

## 2018-03-11 NOTE — Progress Notes (Signed)
Inpatient Rehabilitation Admissions Coordinator  I met with patient at bedside and spoke with Dr. Erlinda Hong. We can plan admit to inpt rehab today. I will alert RN CM and SW and make the arrangements to admit today.  Danne Baxter, RN, MSN Rehab Admissions Coordinator [phone] 03/11/2018 10:30 AM

## 2018-03-11 NOTE — Progress Notes (Signed)
Physical Therapy Treatment Patient Details Name: Nancy Norris MRN: [id number] DOB: 10/30/1940 Today's Date: 03/11/2018    History of Present Illness Patient is a 78 y/o female presents with confusion, amnesia and HA. Head CT- left occipital and left frontal parietial infarcts. MRI- multiple posterior/anterior bilateral infarcts. s/p TEE, s/p EGD with possible malignant gastric mass, s/p IVC filter placement secondary to DVT LLE 2/23. NSTEMI 2/24, s/p blood transfusion 2/24. PMH includes multiple myeloma currently getting chemo, MI, COPD, hypotension, breast ca, fibromyalgia.     PT Comments    Patient progressing well towards PT goals. Feeling much better today. This session focused on navigating hallways, dual tasking, and memory recall. Pt not able to recall 0/3 words within session. Pt with difficulty performing cognitive tasks during gait training resulting in instability and balance deficits. Requires cues to remember 2 step instructions. Min A needed for stability. Pt seems to be learning to compensate for visual deficits with increased time and cues but still with visual field cut. No dizzy symptoms during session today and HR stable. Eager to get to CIR. Will follow.    Follow Up Recommendations  CIR;Supervision for mobility/OOB     Equipment Recommendations  None recommended by PT    Recommendations for Other Services       Precautions / Restrictions Precautions Precautions: Fall Precaution Comments: Rt field cut Restrictions Weight Bearing Restrictions: No    Mobility  Bed Mobility Overal bed mobility: Needs Assistance Bed Mobility: Supine to Sit     Supine to sit: Supervision;HOB elevated     General bed mobility comments: No assist needed, use of bed rails. No dizziness.   Transfers Overall transfer level: Needs assistance Equipment used: None Transfers: Sit to/from Stand Sit to Stand: Min guard         General transfer comment: min guard for safety;  stood from EOB x1, from toilet x1. No dizziness.  Ambulation/Gait Ambulation/Gait assistance: Min assist Gait Distance (Feet): 100 Feet Assistive device: None Gait Pattern/deviations: Step-through pattern;Decreased stride length;Drifts right/left Gait velocity: decreased   General Gait Details: Slow, mildly unsteady gait esp with navigating hallways, dual tasking and performing counting tasks. Able to find signage and follow directions with increased time.   Stairs             Wheelchair Mobility    Modified Rankin (Stroke Patients Only) Modified Rankin (Stroke Patients Only) Pre-Morbid Rankin Score: Slight disability Modified Rankin: Moderately severe disability     Balance Overall balance assessment: Needs assistance Sitting-balance support: Feet supported;No upper extremity supported Sitting balance-Leahy Scale: Good     Standing balance support: During functional activity Standing balance-Leahy Scale: Fair Standing balance comment: Able to wash hands at sink without LOB.             High level balance activites: Head turns;Sudden stops;Direction changes High Level Balance Comments: Tolerated above with mild deviations in gait but no overt LOB.             Cognition Arousal/Alertness: Awake/alert Behavior During Therapy: WFL for tasks assessed/performed Overall Cognitive Status: Impaired/Different from baseline Area of Impairment: Awareness;Problem solving                     Memory: Decreased short-term memory     Awareness: Emergent Problem Solving: Slow processing;Requires verbal cues General Comments: Impaired memory, Not able to recall 0/3 words for short term memory. Difficulty dual tasking- counting down from 10s with ambulation. Practiced hallway navigation, finding room numbers, following  multi step instructions with repetition.      Exercises      General Comments        Pertinent Vitals/Pain Pain Assessment: No/denies  pain    Home Living                      Prior Function            PT Goals (current goals can now be found in the care plan section) Progress towards PT goals: Progressing toward goals    Frequency    Min 4X/week      PT Plan Current plan remains appropriate    Co-evaluation              AM-PAC PT "6 Clicks" Mobility   Outcome Measure  Help needed turning from your back to your side while in a flat bed without using bedrails?: None Help needed moving from lying on your back to sitting on the side of a flat bed without using bedrails?: None Help needed moving to and from a bed to a chair (including a wheelchair)?: A Little Help needed standing up from a chair using your arms (e.g., wheelchair or bedside chair)?: A Little Help needed to walk in hospital room?: A Little Help needed climbing 3-5 steps with a railing? : A Little 6 Click Score: 20    End of Session Equipment Utilized During Treatment: Gait belt Activity Tolerance: Patient tolerated treatment well Patient left: in bed;with call bell/phone within reach Nurse Communication: Mobility status PT Visit Diagnosis: Difficulty in walking, not elsewhere classified (R26.2);Unsteadiness on feet (R26.81)     Time: 7591-6384 PT Time Calculation (min) (ACUTE ONLY): 28 min  Charges:  $Gait Training: 8-22 mins $Neuromuscular Re-education: 8-22 mins                     Wray Kearns, [place], DPT Acute Rehabilitation Services Pager [phone] Office [phone]       Lacie Draft 03/11/2018, 8:47 AM

## 2018-03-11 NOTE — Progress Notes (Signed)
Nancy Arn, MD  Physician  Physical Medicine and Rehabilitation  PMR Pre-admission  Signed  Date of Service:  03/11/2018 10:54 AM       Related encounter: ED to Hosp-Admission (Discharged) from 03/03/2018 in [facility]      Signed         PMR Admission Coordinator Pre-Admission Assessment  Patient: Nancy Norris is an 78 y.o., female MRN: [id number] DOB: 25-Mar-1940 Height: '5\' 5"'$  (165.1 cm) Weight: 80.4 kg  Insurance Information HMO:     PPO:      PCP:      IPA:      80/20: Yes     OTHER:  PRIMARY: Medicare A and B      Policy#: [id number]      Subscriber: Patient CM Name:       Phone#:      Fax#:  Pre-Cert#:       Employer:  Benefits:  Phone #: NA     Name: Verified eligibility on 03/05/18 via [employer]. Date: Part A: 09/16/06; Part B: 09/16/06     Deduct: $1,408.00      Out of Pocket Max: NA      Life Max: NA CIR: Covered per Medicare guidelines once yearly deductible is met      SNF: days 1-20, 100%; days 21-100, 80% Outpatient: 80%     Co-Pay: 20% Home Health: 100%      Co-Pay:  DME: 80%     Co-Pay: 20% Providers: [name] choice SECONDARY: BCBS      Policy#: [id number]      Subscriber: Patient CM Name:       Phone#:      Fax#:  Pre-Cert#:       Employer:  Benefits:  Phone #: [phone]     Name:  Eff. Date:      Deduct:       Out of Pocket Max:       Life Max:  CIR:       SNF:  Outpatient:      Co-Pay:  Home Health:       Co-Pay:  DME:     Co-Pay:   Medicaid Application Date:       Case Manager:  Disability Application Date:       Case Worker:   Emergency Contact Information         Contact Information    Name Relation Home Work [place] Daughter [phone]      Current Medical History  Patient Admitting Diagnosis: Bilateralcortical and subcortical infarcts and various territories  History of Present Illness:  Nancy Norris is a 78 year old right-handed female with past  medical history of multiple myeloma in remission on treatment with systemic chemotherapy and followed by Dr. Earlie Server, remote breast cancer, COPD with remote tobacco abuse 13 years ago, chronic anemia/thrombocytopenia, fibromyalgia, hypertension,CKD stage IV with creatinine 2.29-2.93.  Presented 03/03/2018 with altered mental status,dysphagia, headache 2 weeks, lactic acid 1.2, ammonia level X. She was recently seen in the ED on 02/26/2018 for episodes of dizziness and headache as well as a fall. CT of the head was negative. Noted hemoglobin 7.9 with noted history of chronic anemia, urine culture greater than 100,000 Escherichia coli she was discharged to home after being placed on Keflex. Upon this admission cranial CT scan repeated showing 2 separate areas of abnormal cortical and subcortical edema in the left hemisphere affecting the left parietal lobe occipital lobes that were  not present on 02/26/2018. MRI the brain showed multifocal bilateral acute ischemia within multiple vascular territories in both hemispheres. MRA unremarkable. Patient did not receive TPA. Echocardiogram 03/05/2018 showed ejection fraction 65%. Normal systolic function. Carotid Dopplers with no ICA stenosis. Blood cultures positive for staph species and Proteus started on vancomycin and ceftriaxone 03/06/2018 swallow study completed as well as esophagram 03/06/2018 showing nonspecific esophageal dysmotility no appreciable upper gastric diverticulum and currently maintained on a regular diet. TEE completed 03/07/2018 with ejection fraction 65% and no thrombus or vegetation. Small PFO seen by color Doppler. Patient remained on aspirin for CVA prophylaxis. Gastroenterology consulted Dr. Penelope Coop for evaluation of noted abnormal barium swallow,anemia question GI bleed .An upper GI endoscopy completed 03/08/2020 per Dr.Schooler that showed a large fungating malignant appearing ulcerated proximal stomach mass that had areas of blood seen  consistent with recent bleeding and biopsies taken. Mass likely causing partial obstruction with her by mouth intake. Recommendations were for CT of abdomen and pelvis completed showing marked wall thickening in the esophagogastric junction and proximal stomach consistent with known neoplasm. Bulky lymphadenopathy in the gastrohepatic ligament compatible with metastatic disease. There was a 2.7 cm hypoattenuating lesion in the spleen as well as scattered tiny pulmonary nodules measuring up to 5 mm and mild lymphadenopathy noted in the upper mediastinum. 1.9 x 1.2 cm irregular lesion posterior left upper lobe at the site of a 4 cm mass that was also noted on PET scan 10/11/2015 features compatible with residual of that lesion. Oncology services Dr. Earlie Server follow-up in regards to findings with pathology pending and current plan is to await biopsy report from gastric mass to give further recommendations about management suspicious malignancy. Hospital course complicated by findings of acute DVT left posterior tibial vein, and left peroneal vein with radiology services consulted 03/09/2018 and underwent IVC filter placement per Dr. Barbie Banner of interventional radiology. On 03/10/2018 cardiology again follow-up for nonspecific chest discomfort and dizziness with elevated troponin 2.15-2.43.EKG did show new Q waves on her ECG in the inferior leads. Echocardiogram repeated to 20 05/05/2018 showing ejection fraction of 81% normal systolic function. The right ventricle normal systolic function. No increase in right ventricular wall thickness. It was discussed possible need for cardiac catheterization and patient not interested at this time. Patient is currently on a soft diet. Hemoglobin holding stable at 8.6.   Patient's medical record from [facility] has been reviewed by the rehabilitation admission coordinator and physician. NIH Stroke scale: 2   Past Medical History      Past Medical History:    Diagnosis Date  . Anemia   . Anginal pain ([place])    used NTG x 2 May 31 and 06/15/13   . Anxiety   . Arthritis   . B12 deficiency 12/04/2014  . Breast cancer ([place])   . Complication of anesthesia   . COPD (chronic obstructive pulmonary disease) ([place])   . Cryptococcal pneumonitis ([place]) 11/22/2015  . Depression   . Dizziness   . Dyspnea   . Fibromyalgia   . Fibromyalgia   . GERD (gastroesophageal reflux disease)   . Headache(784.0)   . Heart murmur   . Hemoptysis 10/21/2015  . History of blood transfusion    last one May 12   . Hx of cardiovascular stress test    LexiScan with low level exercise Myoview (02/2013): No ischemia, EF 72%; normal study  . Hx of echocardiogram    a.  Echocardiogram (12/26/2012): EF 27-51%, grade 1 diastolic dysfunction;  b.  Echocardiogram (02/2013): EF 55-60%, no WMA, trivial effusion  . Hyperkalemia   . Hyponatremia   . Hypotension   . Hypothyroidism   . Mucositis   . Multiple myeloma   . Myocardial infarction [facility])    in past, patient was unaware.   . Neuropathy   . Nodule of left lung 09/13/2015  . Pain in joint, pelvic region and thigh 07/07/2015  . Pneumonia    several  . PONV (postoperative nausea and vomiting) 2008   after mastestomy    Family History   family history includes Arthritis in her mother; Asthma in her mother; Cancer in her sister; Hyperlipidemia in her brother.  Prior Rehab/Hospitalizations Has the patient had major surgery during 100 days prior to admission? No               Current Medications  Current Facility-Administered Medications:  .   stroke: mapping our early stages of recovery book, , Does not apply, Once, Wilford Corner, MD .  0.9 %  sodium chloride infusion (Manually program via Guardrails IV Fluids), 250 mL, Intravenous, Once, Wilford Corner, MD, Last Rate: 10 mL/hr at 03/05/18 2227 .  acetaminophen (TYLENOL) tablet 650 mg, 650 mg, Oral, Q6H PRN, 650 mg at  03/09/18 0324 **OR** acetaminophen (TYLENOL) suppository 650 mg, 650 mg, Rectal, Q6H PRN, Wilford Corner, MD .  albuterol (PROVENTIL) (2.5 MG/3ML) 0.083% nebulizer solution 3 mL, 3 mL, Inhalation, Q6H PRN, Wilford Corner, MD .  aspirin tablet 325 mg, 325 mg, Oral, Daily, Wilford Corner, MD, 325 mg at 03/11/18 0856 .  atorvastatin (LIPITOR) tablet 20 mg, 20 mg, Oral, q1800, Wilford Corner, MD, 20 mg at 03/10/18 1651 .  buPROPion [facility]) tablet 75 mg, 75 mg, Oral, BID, Wilford Corner, MD, 75 mg at 03/11/18 0857 .  cephALEXin (KEFLEX) capsule 250 mg, 250 mg, Oral, Q8H, Wilford Corner, MD, 250 mg at 03/11/18 0557 .  citalopram (CELEXA) tablet 40 mg, 40 mg, Oral, Daily, Wilford Corner, MD, 40 mg at 03/11/18 0856 .  cromolyn (OPTICROM) 4 % ophthalmic solution 1-2 drop, 1-2 drop, Both Eyes, QID PRN, Wilford Corner, MD .  feeding supplement (ENSURE ENLIVE) (ENSURE ENLIVE) liquid 237 mL, 237 mL, Oral, BID BM, Florencia Reasons, MD, 237 mL at 03/11/18 0901 .  folic acid (FOLVITE) tablet 1 mg, 1 mg, Oral, Daily, Wilford Corner, MD, 1 mg at 03/11/18 0855 .  gabapentin (NEURONTIN) capsule 300 mg, 300 mg, Oral, Daily PRN, Wilford Corner, MD .  gabapentin (NEURONTIN) capsule 600 mg, 600 mg, Oral, QHS, Wilford Corner, MD, 600 mg at 03/10/18 2102 .  lactated ringers infusion, , Intravenous, Continuous, Florencia Reasons, MD, Last Rate: 75 mL/hr at 03/10/18 2336 .  levothyroxine (SYNTHROID, LEVOTHROID) tablet 150 mcg, 150 mcg, Oral, Q0600, Wilford Corner, MD, 150 mcg at 03/11/18 0557 .  loratadine (CLARITIN) tablet 10 mg, 10 mg, Oral, Daily, Wilford Corner, MD, 10 mg at 03/11/18 0856 .  midodrine (PROAMATINE) tablet 10 mg, 10 mg, Oral, Daily, Wilford Corner, MD, 10 mg at 03/11/18 0857 .  montelukast (SINGULAIR) tablet 10 mg, 10 mg, Oral, QHS, Wilford Corner, MD, 10 mg at 03/10/18 2102 .  ondansetron (ZOFRAN) injection 4 mg, 4 mg, Intravenous, Q6H PRN, Schorr, Rhetta Mura, NP, 4 mg at  03/11/18 0004 .  pantoprazole (PROTONIX) EC tablet 40 mg, 40 mg, Oral, Daily, Ronnette Juniper, MD, 40 mg at 03/10/18 1157 .  pilocarpine (SALAGEN) tablet 5 mg, 5 mg, Oral, BID, Wilford Corner, MD, 5 mg at 03/11/18 0858 .  sodium  chloride flush (NS) 0.9 % injection 3 mL, 3 mL, Intravenous, Once, Wilford Corner, MD .  sucralfate (CARAFATE) 1 GM/10ML suspension 1 g, 1 g, Oral, TID WC & HS, Florencia Reasons, MD, 1 g at 03/11/18 0855 .  traMADol (ULTRAM) tablet 50 mg, 50 mg, Oral, Q6H PRN, Wilford Corner, MD, 50 mg at 03/10/18 1201 .  vitamin B-12 (CYANOCOBALAMIN) tablet 1,000 mcg, 1,000 mcg, Oral, Daily, Wilford Corner, MD, 1,000 mcg at 03/10/18 1056  Patients Current Diet:      Diet Order                  DIET SOFT Room service appropriate? Yes; Fluid consistency: Thin  Diet effective now         Diet general               Precautions / Restrictions Precautions Precautions: Fall Precaution Comments: Rt field cut Restrictions Weight Bearing Restrictions: No   Has the patient had 2 or more falls or a fall with injury in the past year?Yes  Prior Activity Level Limited Community (1-2x/wk): did not drive or work PTA; stayed in mostly during cold whether; ordered groceries to her home, had home delivery medications  Prior Functional Level Do you want Prior Function Level of Independence: Independent Comments: Rides SCAT for transportation; walks without DME. Does cooking/cleaning. ; cancer center provides rides; yeasr of Chemo left; enjoys "diamond" painting; enjoys crafts; does her own financial and Best boy from other? Self Care: Did the patient need help bathing, dressing, using the toilet or eating?  Independent  Indoor Mobility: Did the patient need assistance with walking from room to room (with or without device)? Independent  Stairs: Did the patient need assistance with internal or external stairs (with or without device)?  Independent  Functional Cognition: Did the patient need help planning regular tasks such as shopping or remembering to take medications? Independent  Home Assistive Devices / [place] Devices/Equipment: Eyeglasses Home Equipment: Environmental consultant - 2 wheels, [place] - single point, Civil engineer, contracting, Environmental consultant - 4 wheels  Prior Device Use: Indicate devices/aids used by the patient prior to current illness, exacerbation or injury? None of the above  Prior Functional Level Vocation: Retired Comments: Rides [employer] for transportation; walks without DME. Does cooking/cleaning. ; cancer center provides rides; yeasr of Chemo left; enjoys "diamond" painting; enjoys crafts; does her own Patent attorney   Prior Functional Level Current Functional Level  Bed Mobility Independent Supervision with use of bedrails  Transfers Independent  Min A  Mobility - Walk/Wheelchair Independent Min A   Mobility - Ambulation/Gait Independent Min A 100 feet with IV pole; modest instability with ambulation, requiring 1-2 UE supports and min assist for safety and nevigating around obstacles in hallway  Upper Body Dressing Independent Min A  Lower Body Dressing Independent Min A  Grooming Independent Min A  Eating/Drinking Independent Min A  Economist Min A   Bladder Continence Continent, Independent Continent, Min A  Bowel Management  Continent, Independent Continent, Min A  Stair Administrator NT  Communication Independent Expressive difficulties  Memory Independent Decreased STM, decreased recall of precautions  Cooking/Meal Prep  Insurance account manager  Independent   Driving  Dependent     Special needs/care consideration BiPAP/CPAP: no CPM: no Continuous Drip IV portocath capped off Dialysis: no        Days: NA Life Vest: no Oxygen: no Special Bed: no [employer]  Size: no Wound Vac (area): no      Location:  NA Skin tears to bilateral arms and hands; skin tear to buttocks Bowel mgmt:continent, Last BM  03/10/2018 Bladder mgmt: continent Diabetic mgmt: no  Previous Home Environment Living Arrangements: Alone  Lives With: Alone Available Help at Discharge: Neighbor, Available PRN/intermittently, Family Type of Home: Apartment Home Layout: One level Home Access: Ramped entrance Bathroom Shower/Tub: Chiropodist: Standard Bathroom Accessibility: Yes How Accessible: Accessible via walker [place]: No  Discharge Living Setting Plans for Discharge Living Setting: Alone, Apartment Type of Home at Discharge: Apartment Discharge Home Layout: One level Discharge Home Access: [place] entrance Discharge Bathroom Shower/Tub: Tub/shower unit(with bench with back) Discharge Bathroom Toilet: Standard Discharge Bathroom Accessibility: Yes How Accessible: Accessible via walker(if sidestep) Does the patient have any problems obtaining your medications?: No(has home delivery option)  Social/Family/Support Systems Patient Roles: Other (Comment)(has neighbor who checks in) Contact Information: Mariavictoria Nottingham (daughter): [phone]; work: [phone]; mobile:[phone]; [name] (close family friend): [phone] Anticipated Caregiver: [name] can check in daily after work (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) Anticipated Ambulance person Information: see above Caregiver Availability: Other (Comment)(after work, evenings only) Discharge Plan Discussed with Primary Caregiver: Yes Is Caregiver In Agreement with Plan?: Yes Does Caregiver/Family have Issues with Lodging/Transportation while Pt is in Rehab?: No  Goals/Additional Needs Patient/Family Goal for Rehab: PT/OT: Mod I; SLP: Mod I Expected length of stay: 7-10 days Cultural Considerations: [facility] Dietary Needs: Soft Equipment Needs: TBD Special Service Needs: on  chemotherapy Pt/Family Agrees to Admission and willing to participate: Yes Program Orientation Provided & Reviewed with Pt/Caregiver Including Roles  & Responsibilities: Yes  Barriers to Discharge: Decreased caregiver support, Lack of/limited family support, Pending chemo/radiation  Patient Condition: I have reviewed [facility] records from [facility], spoken with CM, the RN, and the patient and her close friend. I met with patient at the bedside to discuss and gather information needed for the inpatient rehabilitation assessment.  Patient will benefit from ongoing PT, OT, and SLP, can actively participate in 3 hours of therapy a day 5 days of the week, and can make measurable gains during the admission.  Patient will also benefit from the coordinated team approach during an Inpatient Acute Rehabilitation admission.  The patient will receive intensive therapy as well as Rehabilitation physician, nursing, social worker, and care management interventions.  Due to safety, skin/wound care, disease management, medical administration, pain management, and patient education the patient requires 24 hour a day rehabilitation nursing.  The patient is currently min assist with mobility and basic ADLs.  Discharge setting and therapy post discharge at home with home health is anticipated.  Patient has agreed to participate in the Acute Inpatient Rehabilitation Program and will admit today.  Preadmission Screen Completed By: Jhonnie Garner with updates by  Nancy Arn RN MSN, 03/11/2018 10:46 AM ______________________________________________________________________   Discussed status with Dr. Posey Pronto  on  03/11/2018 at 1040 and received telephone approval for admission today.  Admission Coordinator: Jhonnie Garner with updates by   Nancy Arn RN MSN, time  1040 Date  03/11/2018   Assessment/Plan: Diagnosis: Bilateralcortical and subcortical infarcts and various territories  1. Does the need  for close, 24 hr/day  Medical supervision in concert with the patient's rehab needs make it unreasonable for this patient to be served in a less intensive setting? Yes  2. Co-Morbidities requiring supervision/potential complications: multiple myeloma in remission on treatment with systemic  chemotherapy and followed by Dr. Earlie Server, remote breast cancer, COPD with remote tobacco abuse 13 years ago, chronic anemia/thrombocytopenia, fibromyalgia, hypertension,CKD stage IV with creatinine 2.29-2.93 3. Due to safety, disease management and patient education, does the patient require 24 hr/day rehab nursing? Yes 4. Does the patient require coordinated care of a physician, rehab nurse, PT (1-2 hrs/day, 5 days/week) and OT (1-2 hrs/day, 5 days/week) to address physical and functional deficits in the context of the above medical diagnosis(es)? Yes Addressing deficits in the following areas: balance, endurance, locomotion, strength, transferring, bathing, dressing, toileting and psychosocial support 5. Can the patient actively participate in an intensive therapy program of at least 3 hrs of therapy 5 days a week? Yes 6. The potential for patient to make measurable gains while on inpatient rehab is excellent and good 7. Anticipated functional outcomes upon discharge from inpatients are: modified independent and supervision PT, modified independent and supervision OT, n/a SLP 8. Estimated rehab length of stay to reach the above functional goals is: 7-10 days. 9. Anticipated D/C setting: Home 10. Anticipated post D/C treatments: HH therapy and Home excercise program 11. Overall Rehab/Functional Prognosis: good  Jhonnie Garner Admissions Coordinator With updates by Cleatrice Burke RN MSN updates 03/11/2018        Revision History

## 2018-03-11 NOTE — Progress Notes (Signed)
Pt tolerated blood transfusion with no reaction reported or noted. Pt VSS: pt sleeping in bed with call light within reach; no more chest pain or discomfort reported for remainder or shift; reported off to oncoming RN. Delia Heady RN

## 2018-03-11 NOTE — H&P (Addendum)
Physical Medicine and Rehabilitation Admission H&P    Chief Complaint  Patient presents with   Altered Mental Status  : HPI: Nancy Norris is a 78 year old right-handed female with past medical history of multiple myeloma in remission on treatment with systemic chemotherapy and followed by Dr. Earlie Server, remote breast cancer, COPD with remote tobacco abuse 13 years ago, chronic anemia/thrombocytopenia, fibromyalgia, hypertension,CKD stage IV with creatinine 2.29-2.93. Per chart review and patient, patient lives alone and reportedly independent prior to admission. One level home with ramped entrance. Presented 03/03/2018 with altered mental status,dysphagia, headache 2 weeks, lactic acid 1.2, elevated ammonia level. She was recently seen in the ED on 02/26/2018 for episodes of dizziness and headache as well as a fall. CT of the head was negative. Noted hemoglobin 7.9 with noted history of chronic anemia, urine culture greater than 100,000 Escherichia coli she was discharged to home after being placed on Keflex. Upon this admission cranial CT scan repeated showing 2 separate areas of abnormal cortical and subcortical edema in the left hemisphere affecting the left parietal lobe occipital lobes that were not present on 02/26/2018. MRI the brain reviewed showing left greater than right small to moderate sized multifocal infarcts.  Per report, acute ischemia within multiple vascular territories in both hemispheres. MRA unremarkable. Patient did not receive TPA. Echocardiogram 03/05/2018 showed ejection fraction 65%.  Normal systolic function. Carotid Dopplers with no ICA stenosis. Blood cultures positive for staph species and Proteus started on vancomycin and ceftriaxone 03/06/2018 swallow study completed as well as esophagram 03/06/2018 showing nonspecific esophageal dysmotility no appreciable upper gastric diverticulum and currently maintained on a regular diet. TEE completed 03/07/2018 with ejection  fraction 65% and no thrombus or vegetation. Small PFO seen by color Doppler. Patient remained on aspirin for CVA prophylaxis. Gastroenterology consulted Dr. Penelope Coop for evaluation of noted abnormal barium swallow, anemia question GI bleed. An upper GI endoscopy completed 03/08/2020 per Dr.Schooler that showed a large fungating malignant appearing ulcerated proximal stomach mass that had areas of blood seen consistent with recent bleeding and biopsies taken. Mass likely causing partial obstruction with her by mouth intake. Recommendations were for CT of abdomen and pelvis completed showing marked wall thickening in the esophagogastric junction and proximal stomach consistent with known neoplasm. Bulky lymphadenopathy in the gastrohepatic ligament compatible with metastatic disease. There was a 2.7 cm hypoattenuating lesion in the spleen as well as scattered tiny pulmonary nodules measuring up to 5 mm and mild lymphadenopathy noted in the upper mediastinum. 1.9 x 1.2 cm irregular lesion posterior left upper lobe at the site of a 4 cm mass that was also noted on PET scan 10/11/2015 features compatible with residual of that lesion. Oncology services Dr. Earlie Server follow-up in regards to findings with pathology pending and current plan is to await biopsy report from gastric mass to give further recommendations about management suspicious malignancy. Hospital course complicated by findings of acute DVT left posterior tibial vein, and left peroneal vein with radiology services consulted 03/09/2018 and underwent IVC filter placement per Dr. Barbie Banner of interventional radiology. On 03/10/2018 cardiology again follow-up for nonspecific chest discomfort and dizziness with elevated troponin 2.15-2.43.EKG did show new Q waves on her ECG in the inferior leads.  Repeat echocardiogram showing ejection fraction of 16% normal systolic function. The right ventricle normal systolic function. No increase in right ventricular wall thickness. It  was discussed possible need for cardiac catheterization and patient not interested at this time. Patient is currently on a clear liquid diet advance  as tolerated. Hemoglobin holding stable at 8.6. Therapy evaluations completed with recommendations of physical medicine rehabilitation consult. Patient was admitted for a comprehensive rehabilitation program.  Review of Systems  Constitutional: Negative for chills and fever.  HENT: Positive for nosebleeds. Negative for hearing loss.   Eyes: Negative for blurred vision.  Respiratory: Negative for cough and shortness of breath.   Cardiovascular: Negative for chest pain and palpitations.       Nonspecific anginal chest pain  Gastrointestinal:       GERD  Genitourinary: Negative for dysuria, flank pain, hematuria and urgency.  Musculoskeletal: Positive for joint pain and myalgias.  Skin: Negative for rash.  Neurological: Positive for dizziness and focal weakness.  Psychiatric/Behavioral: Positive for depression.       Anxiety  All other systems reviewed and are negative.  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  Echocardiogram (12/26/2012): EF 91-79%, grade 1 diastolic dysfunction;   b.  Echocardiogram (02/2013): EF 55-60%, no WMA, trivial effusion   Hyperkalemia    Hyponatremia     Hypotension    Hypothyroidism    Mucositis    Multiple myeloma    Myocardial infarction ([place])    in past, patient was unaware.    Neuropathy    Nodule of left lung 09/13/2015   Pain in joint, pelvic region and thigh 07/07/2015   Pneumonia    several   PONV (postoperative nausea and vomiting) 2008   after mastestomy   Past Surgical History:  Procedure Laterality Date   ABDOMINAL HYSTERECTOMY  1981   AV FISTULA PLACEMENT Left 06/19/2013   Procedure: CREATION OF LEFT ARM ARTERIOVENOUS (AV) FISTULA ;  Surgeon: Angelia Mould, MD;  Location: [place];  Service: Vascular;  Laterality: Left;   BIOPSY  03/08/2018   Procedure: BIOPSY;  Surgeon: Wilford Corner, MD;  Location: [place];  Service: Endoscopy;;   BREAST RECONSTRUCTION     BREAST SURGERY Right    reduction   CATARACT EXTRACTION, BILATERAL     CHOLECYSTECTOMY  1971   COLONOSCOPY     ESOPHAGOGASTRODUODENOSCOPY (EGD) WITH PROPOFOL N/A 03/08/2018   Procedure: ESOPHAGOGASTRODUODENOSCOPY (EGD) WITH PROPOFOL;  Surgeon: Wilford Corner, MD;  Location: [facility] ENDOSCOPY;  Service: Endoscopy;  Laterality: N/A;   EYE SURGERY Bilateral    lens implant   history of Port removal     IR IVC FILTER PLMT / S&I /IMG GUID/MOD SED  03/09/2018   LUNG BIOPSY  10/21/2015   MASTECTOMY Left 2008   PORTACATH PLACEMENT  12/2012   has had 2   RESECTION OF ARTERIOVENOUS FISTULA ANEURYSM Left 06/04/2017   Procedure: LIGATION ANEURYSM OF LEFT ARTERIOVENOUS FISTULA;  Surgeon:  Angelia Mould, MD;  Location: [facility] OR;  Service: Vascular;  Laterality: Left;   Status post stem cell transplant on September 28, 2008.     TEE WITHOUT CARDIOVERSION N/A 03/07/2018   Procedure: TRANSESOPHAGEAL ECHOCARDIOGRAM (TEE);  Surgeon: Buford Dresser, MD;  Location: [facility] ENDOSCOPY;  Service: Cardiovascular;  Laterality: N/A;   Family History  Problem Relation Age of Onset   Arthritis Mother    Asthma Mother    Cancer Sister      Hyperlipidemia Brother    Social History:  reports that she quit smoking about 13 years ago. Her smoking use included cigarettes. She has a 30.00 pack-year smoking history. She has never used smokeless tobacco. She reports that she does not drink alcohol or use drugs. Allergies:  Allergies  Allergen Reactions   Codeine Anaphylaxis    anaphylasix tolarates oxycodone per care cast   Latex Shortness Of Breath   Chocolate     migraines   Onion Other (See Comments)    Causes migraine headaches   Zyprexa [Olanzapine] Other (See Comments)    Confusion , dizzy,unsteady   Adhesive [Tape] Other (See Comments)    blisters   Heparin Rash    Can flush port with heparin   Hydrocodone Rash    With extreme itching   Iodinated Diagnostic Agents Hives, Itching, Rash and Other (See Comments)    Happened 60 years ago Stage IV kidney function   Oxycodone Rash   Promethazine-Dm Hives   Sulfa Antibiotics Itching   Facility-Administered Medications Prior to Admission  Medication Dose Route Frequency Provider Last Rate Last Dose   ipratropium-albuterol (DUONEB) 0.5-2.5 (3) MG/3ML nebulizer solution 3 mL  3 mL Nebulization Once Liane Comber, NP       Medications Prior to Admission  Medication Sig Dispense Refill   acetaminophen (TYLENOL) 500 MG tablet Take 1,000 mg by mouth daily as needed for moderate pain or headache.     albuterol (PROVENTIL HFA;VENTOLIN HFA) 108 (90 Base) MCG/ACT inhaler Inhale 1-2 puffs into the lungs every 6 (six) hours as needed for wheezing or shortness of breath (cough). 1 Inhaler 3   azelastine (ASTELIN) 0.1 % nasal spray Place 1 spray into both nostrils daily. Use in each nostril as directed 30 mL 2   buPROPion (WELLBUTRIN) 75 MG tablet Take 1 tablet (75 mg total) by mouth 2 (two) times daily. 60 tablet 2   cephALEXin (KEFLEX) 500 MG capsule Take 1 capsule (500 mg total) by mouth 3 (three) times daily. 21 capsule 0   citalopram (CELEXA) 40 MG tablet  Take 1 tablet daily for Mood 90 tablet 1   cromolyn (OPTICROM) 4 % ophthalmic solution INSTILL 1 TO 2 DROPS IN AFFECTED EYE(S) 4 TIMES A DAY FOR ALLERGIES. (Patient taking differently: Place 1-2 drops into both eyes 4 (four) times daily as needed (allergies). ) 10 mL PRN   dexamethasone (DECADRON) 4 MG tablet Take 4 mg by mouth. 5 tabs on day 1 of chemo  0   folic acid (FOLVITE) 1 MG tablet Take 1 tablet (1 mg total) by mouth daily. 30 tablet 3   furosemide (LASIX) 40 MG tablet TAKE 1 TABLET BY MOUTH DAILY. (Patient taking differently: Take 40 mg by mouth daily as needed for fluid or edema. ) 90 tablet 1   gabapentin (NEURONTIN) 300 MG capsule TAKE 1 CAPSULE BY MOUTH 3 TIMES DAILY. (Patient taking differently: Take 300-600 mg by mouth See admin instructions. 600 mg at bedtime and an additional 300 mg during the  day if "cramping") 90 capsule 1   ipratropium-albuterol (DUONEB) 0.5-2.5 (3) MG/3ML SOLN Take 3 mLs by nebulization every 4 (four) hours as needed. Max:6 doses per day 540 mL 0   levocetirizine (XYZAL) 5 MG tablet Take 1 tablet (5 mg total) by mouth every evening. 90 tablet 3   levothyroxine (SYNTHROID, LEVOTHROID) 150 MCG tablet TAKE 1 TABLET BY MOUTH DAILY. NEED OFFICE VISIT FOR FURTHER REFILLS. (Patient taking differently: Take by mouth daily before breakfast. TAKE 1 TABLET BY MOUTH DAILY. NEED OFFICE VISIT FOR FURTHER REFILLS.) 90 tablet 0   loperamide (IMODIUM) 2 MG capsule Take 1 capsule (2 mg total) by mouth as needed for diarrhea or loose stools. 30 capsule 2   magic mouthwash SOLN Take 5 mLs by mouth 4 (four) times daily as needed for mouth pain. 240 mL 1   midodrine (PROAMATINE) 10 MG tablet Take 1 tablet daily to support BP (Patient taking differently: Take 10 mg by mouth daily. ) 90 tablet 3   montelukast (SINGULAIR) 10 MG tablet Take 1 tablet daily for Allergies 90 tablet 1   nystatin (MYCOSTATIN/NYSTOP) powder Apply topically 2 (two) times daily. 30 g 0   ondansetron  (ZOFRAN ODT) 8 MG disintegrating tablet Take 1 tablet (8 mg total) by mouth every 8 (eight) hours as needed for nausea or vomiting. 20 tablet 0   pilocarpine (SALAGEN) 5 MG tablet TAKE 1 TABLET BY MOUTH 3 TIMES DAILY FOR DRY MOUTH. (Patient taking differently: Take 5 mg by mouth 2 (two) times daily. ) 270 tablet 1   ranitidine (ZANTAC) 300 MG tablet Take 1 tablet 2 x /day at [facility] & Bedtime for Heart burn & Reflux (Patient taking differently: Take 300 mg by mouth 2 (two) times daily. ) 180 tablet 3   sucralfate (CARAFATE) 1 g tablet Take 1 tablet dissolved in water  4 x /day BEFORE Meals & Bedtime for Acid indigestion & Reflux (Patient taking differently: Take 1 g by mouth 4 (four) times daily -  with meals and at bedtime. Take 1 tablet dissolved in water  4 x /day BEFORE Meals & Bedtime for Acid indigestion & Reflux) 360 tablet 1   traMADol (ULTRAM) 50 MG tablet Take 1 to 2 tablets by mouth 4 times a day as needed for pain. (Patient taking differently: Take 50-100 mg by mouth 4 (four) times daily as needed for moderate pain. Take 1 to 2 tablets by mouth 4 times a day as needed for pain.) 180 tablet 0   vitamin B-12 (CYANOCOBALAMIN) 1000 MCG tablet Take 1 tablet (1,000 mcg total) by mouth daily. (Patient taking differently: Take 2,000 mcg by mouth daily. ) 30 tablet 0   fluconazole (DIFLUCAN) 150 MG tablet Take one tablet at onset of symptoms and second tablet three days later for yeast. (Patient not taking: Reported on 03/03/2018) 2 tablet 0   nitroGLYCERIN (NITROSTAT) 0.4 MG SL tablet Place 0.4 mg under the tongue every 5 (five) minutes as needed for chest pain.      umeclidinium-vilanterol (ANORO ELLIPTA) 62.5-25 MCG/INH AEPB Inhale 1 puff into the lungs daily as needed (shortness of breath). (Patient not taking: Reported on 03/03/2018) 1 each 1    Drug Regimen Review Drug regimen was reviewed and remains appropriate with no significant issues identified  Home: Home Living Family/patient  expects to be discharged to:: Private residence Living Arrangements: Alone Available Help at Discharge: Neighbor, Available PRN/intermittently, Family Type of Home: Apartment Home Access: Ramped entrance Home Layout: One level Bathroom Shower/Tub: Tub/shower  unit Bathroom Toilet: Standard Bathroom Accessibility: Yes Home Equipment: Environmental consultant - 2 wheels, [employer] - single point, Civil engineer, contracting, Environmental consultant - 4 wheels  Lives With: Alone   Functional History: Prior Function Level of Independence: Independent Comments: Rides [employer] for transportation; walks without DME. Does cooking/cleaning. ; cancer center provides rides; yeasr of Chemo left; enjoys "diamond" painting; enjoys crafts; does her own Administrator, arts Status:  Mobility: Bed Mobility Overal bed mobility: Needs Assistance Bed Mobility: Supine to Sit, Sit to Supine Supine to sit: Supervision, HOB elevated Sit to supine: Supervision, HOB elevated General bed mobility comments: No assist needed, use of bed rails. No dizziness.  Transfers Overall transfer level: Needs assistance Equipment used: None Transfers: Sit to/from Stand Sit to Stand: Min guard General transfer comment: min guard for safety; stood from EOB x1, from toilet x1. + dizziness and reported tiredness getting back from bathroom. + pallor; BP 111/71 Ambulation/Gait Ambulation/Gait assistance: Min assist Gait Distance (Feet): 20 Feet(x3 bouts) Assistive device: None(counter in room) Gait Pattern/deviations: Step-through pattern, Decreased stride length, Drifts right/left General Gait Details: Slow, mildly unsteady gait esp with dual tasking and performing counting tasks. Not able to walk and problem solve- slow gait with LOB. 2 seated rest breaks due to dizziness, feeling tired. Bp 88/62, Sp02 94% with reports of chest discomfort. RN notified.  Gait velocity: decreased Gait velocity interpretation: <1.8 ft/sec, indicate of risk for recurrent  falls    ADL: ADL Overall ADL's : Needs assistance/impaired Eating/Feeding: Minimal assistance Eating/Feeding Details (indicate cue type and reason): a to locate food Grooming: Minimal assistance, Standing Grooming Details (indicate cue type and reason): sequencing adn locating itmes Upper Body Bathing: Minimal assistance, Sitting Lower Body Bathing: Sit to/from stand, Minimal assistance Upper Body Dressing : Moderate assistance, Sitting Lower Body Dressing: Minimal assistance, Sit to/from stand Toilet Transfer: Minimal assistance, Ambulation Toileting- Clothing Manipulation and Hygiene: Minimal assistance, Sit to/from stand Functional mobility during ADLs: Min guard General ADL Comments: Min guard during functional mobility, intermittent HHA  Cognition: Cognition Overall Cognitive Status: Impaired/Different from baseline Arousal/Alertness: Awake/alert Orientation Level: Oriented X4 Attention: Selective Selective Attention: Impaired Selective Attention Impairment: Verbal complex, Functional complex Memory: Impaired Memory Impairment: Decreased recall of new information, Decreased short term memory Decreased Short Term Memory: Verbal basic, Functional basic Awareness: Impaired Awareness Impairment: Emergent impairment Problem Solving: Impaired Problem Solving Impairment: Verbal complex, Functional complex Safety/Judgment: Impaired Cognition Arousal/Alertness: Awake/alert Behavior During Therapy: WFL for tasks assessed/performed Overall Cognitive Status: Impaired/Different from baseline Area of Impairment: Awareness, Problem solving Orientation Level: Disoriented to, Time Current Attention Level: Selective Memory: Decreased short-term memory Safety/Judgement: Decreased awareness of deficits, Decreased awareness of safety Awareness: Emergent Problem Solving: Slow processing, Requires verbal cues General Comments: Impaired memory, able to recall 2/3 words for short term  recall by using word associations. Not able to dual task- subtract 5 or count down numbers during functional mobility. See cognitive TUG results  Physical Exam: Blood pressure (!) 107/53, pulse 62, temperature 98.2 F (36.8 C), temperature source Oral, resp. rate 17, height '5\' 5"'$  (1.651 m), weight 80.4 kg, SpO2 96 %. Physical Exam  Constitutional: She appears well-developed and well-nourished.  HENT:  Head: Normocephalic and atraumatic.  Eyes: EOM are normal. Right eye exhibits no discharge. Left eye exhibits no discharge.  Neck: Normal range of motion. Neck supple.  Cardiovascular: Normal rate and regular rhythm.  Respiratory: Effort normal and breath sounds normal.  GI: Soft. Bowel sounds are normal.  Musculoskeletal:     Comments: No  edema or tenderness in extremities  Neurological: She is alert.  Follows commands and makes good eye contact with examiner.  Some delay in processing but she was able to provide her name, place, age and date of birth. She needed several cueing for the appropriate year. Motor: Right upper extremity/right lower extremity: 5/5 proximal distal Left upper extremity: 4-4+/5 proximal distal Left lower extremity: 4+-5/5 proximal distal Sensation intact light touch  Skin: Skin is warm and dry.  Psychiatric: She has a normal mood and affect. Her behavior is normal. Thought content normal.    Results for orders placed or performed during the hospital encounter of 03/03/18 (from the past 48 hour(s))  Magnesium     Status: None   Collection Time: 03/10/18  6:08 AM  Result Value Ref Range   Magnesium 2.2 1.7 - 2.4 mg/dL    Comment: Performed at [facility], [place] [address], [place], [place] [address]  Troponin I - Now Then Q6H     Status: Abnormal   Collection Time: 03/10/18 11:23 AM  Result Value Ref Range   Troponin I 2.06 (HH) <0.03 ng/mL    Comment: CRITICAL RESULT CALLED TO, READ BACK BY AND VERIFIED WITH: J.RENNER,RN 03/10/2018 1229  DAVISB Performed at [facility], [place] [address] [place], [place] [address]   Prepare RBC     Status: None   Collection Time: 03/10/18  1:35 PM  Result Value Ref Range   Order Confirmation      ORDER PROCESSED BY BLOOD BANK Performed at [facility], [place] [address], [place], [place] [address]   Type and screen [place]     Status: None (Preliminary result)   Collection Time: 03/10/18  2:52 PM  Result Value Ref Range   ABO/RH(D) O POS    Antibody Screen NEG    Sample Expiration 03/13/2018    Unit Number [id number]    Blood Component Type RBC, LR IRR    Unit division 00    Status of Unit ISSUED    Transfusion Status OK TO TRANSFUSE    Crossmatch Result      Compatible Performed at [facility], [place] [address], [place], [place] [address]   Troponin I - Now Then Q6H     Status: Abnormal   Collection Time: 03/10/18  5:35 PM  Result Value Ref Range   Troponin I 2.43 (HH) <0.03 ng/mL    Comment: CRITICAL VALUE NOTED.  VALUE IS CONSISTENT WITH PREVIOUSLY REPORTED AND CALLED VALUE. Performed at [facility], [place] [address], [place], [place] [address]   Troponin I - Now Then Q6H     Status: Abnormal   Collection Time: 03/10/18 11:53 PM  Result Value Ref Range   Troponin I 2.15 (HH) <0.03 ng/mL    Comment: CRITICAL VALUE NOTED.  VALUE IS CONSISTENT WITH PREVIOUSLY REPORTED AND CALLED VALUE. Performed at [facility], [place] [address], [place], [place] [address]    *Note: Due to a large number of results and/or encounters for the requested time period, some results have not been displayed. A complete set of results can be found in Results Review.   Ir Ivc Filter Plmt / S&i /img Guid/mod Sed  Result Date: 03/09/2018 INDICATION: Paradoxical embolus.  Calf vein DVT.  Pulmonary embolism. EXAM: IVC FILTER,INFERIOR VENA CAVOGRAM MEDICATIONS: None. ANESTHESIA/SEDATION: Fentanyl 75 mcg IV; Versed 1 mg IV Moderate Sedation Time:  17 minutes  The patient was continuously monitored  during the procedure by the interventional radiology nurse under my direct supervision. FLUOROSCOPY TIME:  Fluoroscopy Time: 3 minutes 30 seconds (31 mGy). COMPLICATIONS: None immediate. PROCEDURE: Informed written consent was obtained from the patient after a thorough discussion of the procedural risks, benefits and alternatives. All questions were addressed. Maximal Sterile Barrier Technique was utilized including caps, mask, sterile gowns, sterile gloves, sterile drape, hand hygiene and skin antiseptic. A timeout was performed prior to the initiation of the procedure. The right neck was prepped with Betadine in a sterile fashion, and a sterile drape was applied covering the operative field. A sterile gown and sterile gloves were used for the procedure. The right jugular vein was noted to be patent initially with ultrasound. Under sonographic guidance, a micropuncture needle was inserted into the right jugular vein (Ultrasound image documentation was performed). It was removed over an 018 wire which was up-sized to a [place]. The sheath was inserted over the wire and into the IVC. IVC carbon dioxide venography was performed. The temporary [place] filter was then deployed in the infrarenal IVC. The sheath was removed and hemostasis was achieved with direct pressure. FINDINGS: IVC venography demonstrates renal inflow at the mid L2 level. There is no venous anomaly. There is no evidence of mega cava. Findings were corroborated with the accompanying CT abdomen. The final image demonstrates a deployed IVC filter with its tip at the lower L2 endplate. IMPRESSION: Successful infrarenal IVC filter placement. This is a temporary filter. It can be removed or remain in place to become permanent. PLAN: This IVC filter is potentially retrievable. The patient will be assessed for filter retrieval by Interventional Radiology in approximately 8-12 weeks. Further recommendations regarding  filter retrieval, continued surveillance or declaration of device permanence, will be made at that time. Electronically Signed   By: Marybelle Killings M.D.   On: 03/09/2018 12:15       Medical Problem List and Plan: 1.  Confusion/AMS with gait disorder secondary to bilateral cortical and subcortical infarcts and various territories felt to be embolic  Admit to CIR 2.  Antithrombotics: -DVT/anticoagulation:  Acute DVT left posterior tibial vein, left peroneal vein per vascular study. Status post IVC filter 03/09/2018 per interventional radiology  -antiplatelet therapy: aspirin 325 mg daily 3. Pain Management: Neurontin 600 mg daily at bedtime and 300 mg daily as needed, tramadol as needed 4. Mood: Wellbutrin 75 mg twice a day, Celexa 40 mg daily. Provide emotional support  -antipsychotic agents: see above 5. Neuropsych: This patient is not fully capable of making decisions on her own behalf. 6. Skin/Wound Care:  Routine skin checks 7. Fluids/Electrolytes/Nutrition:  Routine and out's with follow-up chemistries in the a.m. 8. History of multiple myeloma with pancytopenia. Followed by Dr. Earlie Server 9. Findings a large fungating ulcerated proximal gastric mass suspicious for malignancy as well as GI bleed. Pathology report pending. Follow-up oncology services on plan of care 10. NSTEMI. Follow-up cardiology services.presently with no chest pain 11.Postural hypotension. Continue ProAmatine 10 mg daily 12.CKD stage IV.avoid nephrotoxins. Avoid contrast.history of AV fistula placement 2015 Followed by nephrology as outpatient. 13. Dysphagia. Esophagram results noted. Follow-up gastroenterology. Speech therapy to follow.  Currently on soft diet 14. Hypothyroidism. Synthroid 15. Hyperlipidemia. Lipitor  Lavon Paganini Angiulli, PA-C 03/11/2018

## 2018-03-11 NOTE — Progress Notes (Signed)
Subjective: Patient was seen and examined at bedside. Reports having one small black bowel movement today morning, tolerating mechanical soft diet without trouble swallowing.  Objective: Vital signs in last 24 hours: Temp:  [98 F (36.7 C)-98.9 F (37.2 C)] 98.8 F (37.1 C) (02/25 1154) Pulse Rate:  [62-75] 75 (02/25 1154) Resp:  [16-18] 16 (02/25 1154) BP: (107-135)/(53-77) 111/75 (02/25 1154) SpO2:  [96 %-99 %] 96 % (02/25 1154) Weight:  [80.4 kg] 80.4 kg (02/25 0500) Weight change:  Last BM Date: 03/10/18  PE: Prominent pallor, no obvious icterus GENERAL: Not in distress ABDOMEN: Nondistended EXTREMITIES: No deformity  Lab Results: Results for orders placed or performed during the hospital encounter of 03/03/18 (from the past 48 hour(s))  Magnesium     Status: None   Collection Time: 03/10/18  6:08 AM  Result Value Ref Range   Magnesium 2.2 1.7 - 2.4 mg/dL    Comment: Performed at [facility], [place] [address] [place], [place] [address]  Troponin I - Now Then Q6H     Status: Abnormal   Collection Time: 03/10/18 11:23 AM  Result Value Ref Range   Troponin I 2.06 (HH) <0.03 ng/mL    Comment: CRITICAL RESULT CALLED TO, READ BACK BY AND VERIFIED WITH: J.RENNER,RN 03/10/2018 1229 DAVISB Performed at [facility], [place] [address], [place], [place] [address]   Prepare RBC     Status: None   Collection Time: 03/10/18  1:35 PM  Result Value Ref Range   Order Confirmation      ORDER PROCESSED BY BLOOD BANK Performed at [facility], [place] [address], [place], [place] [address]   Type and screen [place]     Status: None   Collection Time: 03/10/18  2:52 PM  Result Value Ref Range   ABO/RH(D) O POS    Antibody Screen NEG    Sample Expiration 03/13/2018    Unit Number [id number]    Blood Component Type RBC, LR IRR    Unit division 00    Status of Unit ISSUED,FINAL    Transfusion Status OK TO TRANSFUSE    Crossmatch Result     Compatible Performed at [facility], [place] [address] [place], [place] [address]   Troponin I - Now Then Q6H     Status: Abnormal   Collection Time: 03/10/18  5:35 PM  Result Value Ref Range   Troponin I 2.43 (HH) <0.03 ng/mL    Comment: CRITICAL VALUE NOTED.  VALUE IS CONSISTENT WITH PREVIOUSLY REPORTED AND CALLED VALUE. Performed at [facility], [place] [address], [place], [place] [address]   Troponin I - Now Then Q6H     Status: Abnormal   Collection Time: 03/10/18 11:53 PM  Result Value Ref Range   Troponin I 2.15 (HH) <0.03 ng/mL    Comment: CRITICAL VALUE NOTED.  VALUE IS CONSISTENT WITH PREVIOUSLY REPORTED AND CALLED VALUE. Performed at [facility], [place] [address], [place],  [address]   Basic metabolic panel     Status: Abnormal   Collection Time: 03/11/18  5:00 AM  Result Value Ref Range   Sodium 140 135 - 145 mmol/L   Potassium 3.9 3.5 - 5.1 mmol/L   Chloride 108 98 - 111 mmol/L   CO2 27 22 - 32 mmol/L   Glucose, Bld 105 (H) 70 - 99 mg/dL   BUN 13 8 - 23 mg/dL   Creatinine, Ser 2.13 (H) 0.44 -  1.00 mg/dL   Calcium 8.8 (L) 8.9 - 10.3 mg/dL   GFR calc non Af Amer 22 (L) >60 mL/min   GFR calc Af Amer 25 (L) >60 mL/min   Anion gap 5 5 - 15    Comment: Performed at [place] [address], [place], [place] [address]  CBC with Differential/Platelet     Status: Abnormal   Collection Time: 03/11/18  5:00 AM  Result Value Ref Range   WBC 3.7 (L) 4.0 - 10.5 K/uL   RBC 2.65 (L) 3.87 - 5.11 MIL/uL   Hemoglobin 8.6 (L) 12.0 - 15.0 g/dL   HCT 26.9 (L) 36.0 - 46.0 %   MCV 101.5 (H) 80.0 - 100.0 fL   MCH 32.5 26.0 - 34.0 pg   MCHC 32.0 30.0 - 36.0 g/dL   RDW 22.2 (H) 11.5 - 15.5 %   Platelets 52 (L) 150 - 400 K/uL    Comment: Immature Platelet Fraction may be clinically indicated, consider ordering this additional test [id number]    nRBC 0.0 0.0 - 0.2 %   Neutrophils Relative % 64 %   Neutro Abs 2.4 1.7 - 7.7 K/uL   Lymphocytes Relative 23  %   Lymphs Abs 0.9 0.7 - 4.0 K/uL   Monocytes Relative 4 %   Monocytes Absolute 0.1 0.1 - 1.0 K/uL   Eosinophils Relative 6 %   Eosinophils Absolute 0.2 0.0 - 0.5 K/uL   Basophils Relative 1 %   Basophils Absolute 0.0 0.0 - 0.1 K/uL   nRBC 1 (H) 0 /100 WBC   Promyelocytes Relative 2 %   Abs Immature Granulocytes 0.10 (H) 0.00 - 0.07 K/uL   Polychromasia PRESENT     Comment: Performed at [facility], [place] [address] [place], [place] [address]   *Note: Due to a large number of results and/or encounters for the requested time period, some results have not been displayed. A complete set of results can be found in Results Review.    Studies/Results: No results found.  Medications: I have reviewed the patient's current medications.  Assessment: Gastric mass noted on endoscopy, biopsies pending Hb 8.6 today after 1 unit PRBC transfusion on 03/10/2018  Multiple comorbidities- acute bilateral multifocal CVA,  patent foreman ovale,  acute left lower extremity DVT, retrievable IVC filter placement on 03/09/2018,  history of multiple myeloma, bone marrow transplant in 2010,  pancytopenia,  history of breast cancer, status post left mastectomy in 2008, chronic kidney disease stage IV  Plan: Patient is going to be discharged to inpatient rehab, plan to follow biopsies when available.   Will likely need general surgery consult depending upon oncology recommendation. Plan to continue PPI as long as patient needs aspirin/anticoagulation.   Ronnette Juniper 03/11/2018, 12:07 PM   Pager [phone] If no answer or after 5 PM call [phone]

## 2018-03-11 NOTE — H&P (Deleted)
  The note originally documented on this encounter has been moved the the encounter in which it belongs.  

## 2018-03-11 NOTE — Progress Notes (Signed)
Patient has a DNR wrist band but the order in her chart read Full code. The patient states she is a DNR. PA was called and informed. Tried to call daughter to confirm with family member as well. Need to follow up and get correct order placed. Doy Hutching, LPN

## 2018-03-11 NOTE — Discharge Summary (Addendum)
Discharge Summary  Nancy Norris MHD:[id number] DOB: Feb 15, 1940  PCP: Nancy Pinto, Norris  Admit date: 03/03/2018 Discharge date: 03/11/2018  Time spent: 83mns, more than 50% time spent on coordination of care.   Patient is being discharged to inpatient rehab  Recommendations for Outpatient Follow-up:  1. F/u with PCP within a week  for hospital discharge follow up, repeat cbc/bmp at follow up 2. F/u with oncology Dr Nancy Nordmann3. F/u with general surgery Dr Nancy Norris If oncology recommends surgical follow up 4. F/u with neurology  5. F/u with cardiology as needed  Discharge Diagnoses:  [facility]   Diagnosis Date Noted  . Stroke (Nancy Norris 03/03/2018  . Acute DVT (deep venous thrombosis) (Nancy Norris   . Malignant neoplasm of stomach (Nancy Norris   . Acute cardioembolic stroke (Nancy Norris   . CKD (chronic kidney disease), stage III (Nancy Norris   . Dysphagia   . CKD (chronic kidney disease), stage IV (Nancy Norris   . Symptomatic anemia 03/31/2015  . Thrombocytopenia ([facility] 12/04/2014  . Multiple myeloma (Nancy Norris 12/02/2013  . COPD (chronic obstructive pulmonary disease) with emphysema (Nancy Norris 02/20/2013  . GERD (gastroesophageal reflux disease) 01/20/2013  . Hypothyroid 01/13/2013  . HX: breast cancer 12/08/2012    [facility]  No resolved [facility] to display.    Discharge Condition: stable  Diet recommendation: soft diet  Filed Weights   03/08/18 0730 03/09/18 0351 03/11/18 0500  Weight: 79.3 kg 78.8 kg 80.4 kg    History of present illness: (per admitting Norris Dr Nancy Norris PCP: Nancy Pinto Norris  Patient coming from: home  I have personally briefly reviewed patient's old medical records in [facility] Chief Complaint: altered mental status  HPI: Nancy HENDRYis a 78y.o. female with medical history significant of multiple myeloma, COPD, hypothyroidism, reflux, anemia and multiple other chronic medical issues presenting with confusion and altered mental status  over the past few days.   History is obtained with assistance from the patient's daughter.  Patient notes that she is having word finding difficulties.  She describes it hard to explain what been going on but thinks that her symptoms started within the past few days.  Her daughter clarifies that she has had she fainted within the past few days.  She has had recurrent headaches in the past few weeks.  Within the last few days, the family has noted that she has had difficulty with speech.  On Saturday, they noted that she was not quite herself.  Today she was confused and scared.  She denies any fevers, chills, cough, chest pain, shortness of breath.  She notes nausea.  She denies seeing any facial droop purse or noticing any slurred speech.  ED Course: Labs, head CT and MRI.  [facility]   [facility] Course:  Principal Problem:   Stroke ([facility]:   HX: breast cancer   Hypothyroid   GERD (gastroesophageal reflux disease)   COPD (chronic obstructive pulmonary disease) with emphysema (Nancy Norris)   Multiple myeloma (Nancy Norris)   Thrombocytopenia (Nancy Norris)   Symptomatic anemia   Dysphagia   CKD (chronic kidney disease), stage IV (Nancy Norris)   Acute cardioembolic stroke (Nancy Norris)   CKD (chronic kidney disease), stage III (Nancy Norris)   Acute DVT (deep venous thrombosis) (Nancy Norris)   Malignant neoplasm of stomach (Nancy Norris)   Acute bilateral multifocal CVA (presenting symptom): -patient presented with encaphlopathy/altered mental status -Multifocal bilateral acute ischemia within multiple vascular territories in both hemispheres. This may indicate a central cardiac  or aortic embolic source. -MRA "Normal intracranial MRA." -has been sinus rhythm on tele -TEE +PFO, lower extremity doppler + LE acute DVT (Left: Findings consistent with acute deep vein thrombosis involving the left posterior tibial vein, and left peroneal vein.), she is not a candidate for anticoagulation due to possible gastric cancer, s/p  retrievable IVC filter placement on 2/23 -a1c, ldl 85 -on aspirin 325 mg daily. On statin, neurology signed off - Confusion (presenting symptom) has resolved, speech is improving, but frustrated about her writing, she is lookingforward to go to CIR.  Dysphagia/Gastric cancer ( new diagnosis) -she does reports ongoing dysphagia for several months, ab pain when she eats in the last few months, she denies n/v, states stool is brown -MBS unremarkable -dg esophagus concerning for distal esophageal ulcer vs tumor  -EGD concerning for gastric cancer with high bleeding risk, pathology pending at discharge - cancer staging scan concerning for mets to lymphanode -on ppi, pepcid, sucralfate, on soft diet -She is to follow up with oncology Dr Nancy Norris     Pancytopenia / multiple myeloma  -h/o bone marrow transplant in 2010 -case discussed with oncology Dr Nancy Norris who states patient is getting prn treatment for MM to preserve renal function, multiple myeloma panel is stable -f/u with oncology    H/o breast cancer , s/p left mastectomy in 2008   Chronic kidney disease stage IV: H/o avf placement in 2015 With a baseline creatinine in 3-2.5 Continue outpatient follow up with nephrology     COPD: Seems to be stable On anoro andsingulair    Dry mouth: on pilocarpine  Hypocalcemia: s/p calcium gluconate , ca improved    Initial bcid + proteus and gpc Blood culture no growth, no fever, no leukocytosis D/c vanc,  D/c rocephin, change to keflex for 4days, to cover possible proteus due to h/o port cath and immunosuppressed status She finished abx treatment in the hospital , no fever.  NSVTx1  8beats of NSVT on tele on 2/23 am while she is awake, she is asymptomatic  keep K>4, mag >2 bp too low to tolerate betablocker  NSTEMI:  She developed Dizziness with chest tightness on 2/24 with q waves in inferior leads  On EKG Cardiology consulted, she is high risk for  intervention, medical management with statin, asa, prn nitro She received prbc transfusion as well Symptom resolved, no chest pain, no dizziness at discharge, she did well with physical therapy.  Chronic orthostatic hypotension She is continued on home meds midodrine, she received hydration and blood transfusion in the hospital Improved D/c home meds lasix   Code Status: DNR  Family Communication: patient   Disposition Plan:   CIR     Consultants:  Neurology for acute cva  Cardiology for TEE, cardiology reconsulted on 2/24 for chest pain and elevation of troponin  Oncology Dr Nancy Norris, Dr Alen Blew for new diagnosis of gastric cancer /acute dvt/ multiple myeloma    [facility] for egd with biopsy  CIR  IR Dr Barbie Banner for ivc filter placement  Procedures:  TEE on 2/21  Dg esophagus on 2/20  MBS  on 2/20  EGD with biopsy on 2/22, pathology pending at time of discharge  retrievable ivc filter placement on 2/23  Antibiotics:  Vanc/rocephin since admission to 2/21  Keflex from 2/21 to 2/25   Discharge Exam: BP 109/77 (BP Location: Right Arm)   Pulse 75   Temp 98.7 F (37.1 C) (Oral)   Resp 16   Ht '5\' 5"'$  (1.651 m)  Wt 80.4 kg   SpO2 96%   BMI 29.50 kg/m   General: NAD, aaox3, very pleasant  Cardiovascular: RRR Respiratory: CTABL Ab: mild epigastric tender, no guarding ,no rebound, + bs Extremity: mild left lower extremity pitting edema  Discharge Instructions You were cared for by a hospitalist during your hospital stay. If you have any questions about your discharge medications or the care you received while you were in the hospital after you are discharged, you can call the unit and asked to speak with the hospitalist on call if the hospitalist that took care of you is not available. Once you are discharged, your primary care physician will handle any further medical issues. Please note that NO REFILLS for any discharge medications will be  authorized once you are discharged, as it is imperative that you return to your primary care physician (or establish a relationship with a primary care physician if you do not have one) for your aftercare needs so that they can reassess your need for medications and monitor your lab values.  Discharge Instructions    Ambulatory referral to Neurology   Complete by:  As directed    Follow up with stroke clinic NP (Jessica Vanschaick or Cecille Rubin, if both not available, consider Dr. Antony Contras, Dr. Bess Harvest, or Dr. Sarina Ill) at [facility] in about 4 weeks.  For d/c to inpatient rehab today   Diet general   Complete by:  As directed    Soft diet   Increase activity slowly   Complete by:  As directed      Allergies as of 03/11/2018      Reactions   Codeine Anaphylaxis   anaphylasix tolarates oxycodone per care cast   Latex Shortness Of Breath   Chocolate    migraines   Onion Other (See Comments)   Causes migraine headaches   Zyprexa [olanzapine] Other (See Comments)   Confusion , dizzy,unsteady   Adhesive [tape] Other (See Comments)   blisters   Heparin Rash   Can flush port with heparin   Hydrocodone Rash   With extreme itching   Iodinated Diagnostic Agents Hives, Itching, Rash, Other (See Comments)   Happened 60 years ago Stage IV kidney function   Oxycodone Rash   Promethazine-dm Hives   Sulfa Antibiotics Itching      Medication List    STOP taking these medications   cephALEXin 500 MG capsule Commonly known as:  KEFLEX   dexamethasone 4 MG tablet Commonly known as:  DECADRON   furosemide 40 MG tablet Commonly known as:  LASIX     TAKE these medications   acetaminophen 500 MG tablet Commonly known as:  TYLENOL Take 2 tablets (1,000 mg total) by mouth daily as needed for moderate pain or headache.   albuterol 108 (90 Base) MCG/ACT inhaler Commonly known as:  PROVENTIL HFA;VENTOLIN HFA Inhale 1-2 puffs into the lungs every 6  (six) hours as needed for wheezing or shortness of breath (cough).   aspirin EC 325 MG tablet Take 1 tablet (325 mg total) by mouth daily.   atorvastatin 20 MG tablet Commonly known as:  LIPITOR Take 1 tablet (20 mg total) by mouth daily at 6 PM.   azelastine 0.1 % nasal spray Commonly known as:  ASTELIN Place 1 spray into both nostrils daily. Use in each nostril as directed   buPROPion 75 MG tablet Commonly known as:  WELLBUTRIN Take 1 tablet (75 mg total) by mouth 2 (two) times daily.  citalopram 40 MG tablet Commonly known as:  CELEXA Take 1 tablet daily for Mood   cromolyn 4 % ophthalmic solution Commonly known as:  OPTICROM INSTILL 1 TO 2 DROPS IN AFFECTED EYE(S) 4 TIMES A DAY FOR ALLERGIES. What changed:  See the new instructions.   feeding supplement (ENSURE ENLIVE) Liqd Take 237 mLs by mouth 2 (two) times daily between meals.   fluconazole 150 MG tablet Commonly known as:  DIFLUCAN Take one tablet at onset of symptoms and second tablet three days later for yeast.   folic acid 1 MG tablet Commonly known as:  FOLVITE Take 1 tablet (1 mg total) by mouth daily.   gabapentin 300 MG capsule Commonly known as:  NEURONTIN TAKE 1 CAPSULE BY MOUTH 3 TIMES DAILY. What changed:    how much to take  when to take this  additional instructions   ipratropium-albuterol 0.5-2.5 (3) MG/3ML Soln Commonly known as:  DUONEB Take 3 mLs by nebulization every 4 (four) hours as needed. Max:6 doses per day   levocetirizine 5 MG tablet Commonly known as:  XYZAL Take 1 tablet (5 mg total) by mouth every evening.   levothyroxine 150 MCG tablet Commonly known as:  SYNTHROID, LEVOTHROID TAKE 1 TABLET BY MOUTH DAILY. NEED OFFICE VISIT FOR FURTHER REFILLS. What changed:  See the new instructions.   loperamide 2 MG capsule Commonly known as:  IMODIUM Take 1 capsule (2 mg total) by mouth as needed for diarrhea or loose stools.   magic mouthwash Soln Take 5 mLs by mouth 4 (four)  times daily as needed for mouth pain.   midodrine 10 MG tablet Commonly known as:  PROAMATINE Take 1 tablet daily to support BP What changed:    how much to take  how to take this  when to take this  additional instructions   montelukast 10 MG tablet Commonly known as:  SINGULAIR Take 1 tablet daily for Allergies   nitroGLYCERIN 0.4 MG SL tablet Commonly known as:  NITROSTAT Place 0.4 mg under the tongue every 5 (five) minutes as needed for chest pain.   nystatin powder Commonly known as:  MYCOSTATIN/NYSTOP Apply topically 2 (two) times daily.   ondansetron 8 MG disintegrating tablet Commonly known as:  ZOFRAN ODT Take 1 tablet (8 mg total) by mouth every 8 (eight) hours as needed for nausea or vomiting.   pantoprazole 40 MG tablet Commonly known as:  PROTONIX Take 1 tablet (40 mg total) by mouth daily.   pilocarpine 5 MG tablet Commonly known as:  SALAGEN TAKE 1 TABLET BY MOUTH 3 TIMES DAILY FOR DRY MOUTH. What changed:  See the new instructions.   ranitidine 300 MG tablet Commonly known as:  ZANTAC Take 1 tablet 2 x /day at [facility] & Bedtime for Heart burn & Reflux What changed:    how much to take  how to take this  when to take this  additional instructions   sucralfate 1 g tablet Commonly known as:  CARAFATE Take 1 tablet dissolved in water  4 x /day BEFORE Meals & Bedtime for Acid indigestion & Reflux What changed:    how much to take  how to take this  when to take this   traMADol 50 MG tablet Commonly known as:  ULTRAM Take 1 to 2 tablets by mouth 4 times a day as needed for pain. What changed:    how much to take  how to take this  when to take this  reasons to take this  umeclidinium-vilanterol 62.5-25 MCG/INH Aepb Commonly known as:  ANORO ELLIPTA Inhale 1 puff into the lungs daily as needed (shortness of breath).   vitamin B-12 1000 MCG tablet Commonly known as:  CYANOCOBALAMIN Take 1 tablet (1,000 mcg total) by mouth  daily. What changed:  how much to take      Allergies  Allergen Reactions  . Codeine Anaphylaxis    anaphylasix tolarates oxycodone per care cast  . Latex Shortness Of Breath  . Chocolate     migraines  . Onion Other (See Comments)    Causes migraine headaches  . Zyprexa [Olanzapine] Other (See Comments)    Confusion , dizzy,unsteady  . Adhesive [Tape] Other (See Comments)    blisters  . Heparin Rash    Can flush port with heparin  . Hydrocodone Rash    With extreme itching  . Iodinated Diagnostic Agents Hives, Itching, Rash and Other (See Comments)    Happened 60 years ago Stage IV kidney function  . Oxycodone Rash  . Promethazine-Dm Hives  . Sulfa Antibiotics Itching   Follow-up Information    [facility] Follow up in 4 week(s).   Specialty:  Neurology Why:  stroke clinic. office will call wtih appt date and time.  Contact information: [address] [place] [phone]       Stark Klein, Norris. Schedule an appointment as soon as possible for a visit.   Specialty:  General Surgery Why:  if oncology recommends surgical follow up is needed. Contact information: [address] [place] [address] [phone]        Curt Bears, Norris Follow up.   Specialty:  Oncology Contact information: [place] [address] [phone]        Jettie Booze, Norris Follow up.   Specialties:  Cardiology, Radiology, Interventional Cardiology Why:  as needed for chest pain Contact information: [address] [place] [address] [phone]            The results of significant diagnostics from this hospitalization (including imaging, microbiology, ancillary and laboratory) are listed below for reference.    Significant Diagnostic Studies: Ct Abdomen Pelvis Wo Contrast  Result Date: 03/08/2018 CLINICAL DATA:  Large ulcerated gastric fundal  mass found on upper endoscopy today. EXAM: CT CHEST, ABDOMEN AND PELVIS WITHOUT CONTRAST TECHNIQUE: Multidetector CT imaging of the chest, abdomen and pelvis was performed following the standard protocol without IV contrast. COMPARISON:  CT chest 01/20/2018 FINDINGS: CT CHEST FINDINGS Cardiovascular: The heart size is normal. No substantial pericardial effusion. Coronary artery calcification is evident. Atherosclerotic calcification is noted in the wall of the thoracic aorta. Right Port-A-Cath tip is positioned in the distal SVC near the junction with the RA. Mediastinum/Nodes: 12 mm short axis prevascular node seen on 17/3. Scattered small central mediastinal lymph nodes identified in the upper mediastinum. 12 mm short axis retrotracheal node visible on 16/3. No subcarinal or lower paraesophageal lymphadenopathy. No evidence for gross hilar lymphadenopathy although assessment is limited by the lack of intravenous contrast on today's study. There is no axillary lymphadenopathy. Lungs/Pleura: The central tracheobronchial airways are patent. 19 mm ring-like lesion in the right upper lobe is stable and may represent airway impaction with associated tree-in-bud nodularity. 6 mm medial right upper lobe nodule visible on 67/4. 5 mm right middle lobe nodule visible on 102/4, stable. 1.9 x 1.2 cm irregular opacity in the posterior left upper lobe is at the site of  the 4.2 x 3.8 cm mass seen on PET-CT of 10/11/2015 and may reflect residual scarring. Small left pleural effusion evident. Musculoskeletal: No worrisome lytic or sclerotic osseous abnormality. CT ABDOMEN PELVIS FINDINGS Hepatobiliary: No focal abnormality in the liver on this study without intravenous contrast. Gallbladder surgically absent. No intrahepatic or extrahepatic biliary dilation. Pancreas: No focal mass lesion. No dilatation of the main duct. No intraparenchymal cyst. No peripancreatic edema. Spleen: Subtle 2.7 x 2.0 cm hypoattenuating lesion is  identified in the dome of spleen, not definitely seen on prior. Adrenals/Urinary Tract: Right adrenal gland unremarkable. 1.6 cm left adrenal nodule stable since PET-CT of 10/01/2015. Cortical scarring noted in both kidneys. No evidence for hydroureter. The urinary bladder appears normal for the degree of distention. Stomach/Bowel: Tiny hiatal hernia noted. Wall thickening is noted in the esophagogastric junction and proximal stomach. Wall thickening extends along the medial aspect of the proximal stomach. Distal stomach unremarkable. Duodenum is normally positioned as is the ligament of Treitz. No small bowel wall thickening. No small bowel dilatation. The terminal ileum is normal. The appendix is not visualized, but there is no edema or inflammation in the region of the cecum. No gross colonic mass. No colonic wall thickening. Vascular/Lymphatic: There is abdominal aortic atherosclerosis without aneurysm. Bulky lymphadenopathy is seen in the hepato duodenal ligament. 3.2 x 4.5 cm lymph node seen on 55/3. The 2.9 x 2.8 cm lymph node seen on 57/3. 10 mm short axis portal caval lymph node (63/3) is suspicious. No para-aortic retroperitoneal lymphadenopathy. No pelvic sidewall lymphadenopathy. Reproductive: The uterus is surgically absent. There is no adnexal mass. Other: No intraperitoneal free fluid. Musculoskeletal: No worrisome lytic or sclerotic osseous abnormality. IMPRESSION: 1. Marked wall thickening in the esophagogastric junction and proximal stomach, consistent with known neoplasm. Bulky lymphadenopathy in the gastrohepatic ligament is compatible with metastatic disease. 10 mm short axis portal caval lymph node also suspicious. 2. 2.7 cm hypoattenuating lesion in the spleen. This is not well assessed on today's noncontrast exam. Close attention recommended as metastatic disease not excluded. 3. Scattered tiny pulmonary nodules measuring up to 5 mm. Nonspecific, but close attention recommended as metastatic  disease not excluded. 4. Mild lymphadenopathy noted in the upper mediastinum. 5. Circular lesion right upper lobe on axial imaging has a branching configuration on coronal and sagittal images. This is probably airway impaction related to atypical infection. Close follow-up recommended. 6. 1.9 x 1.2 cm irregular lesion posterior left upper lobe at the site of a 4 cm mass on PET-CT of 10/11/2015. Imaging features today are compatible with residua of that lesion. 7. 1.6 cm left adrenal nodule stable since 10/11/2015, likely benign. 8.  Aortic Atherosclerois (ICD10-170.0) Electronically Signed   By: Misty Stanley M.D.   On: 03/08/2018 19:19   Dg Chest 2 View  Result Date: 03/03/2018 CLINICAL DATA:  Weakness, dizziness and confusion over the last 2 weeks. History of renal cell cancer and myeloma. EXAM: CHEST - 2 VIEW COMPARISON:  02/26/2018 radiography.  CT 01/20/2018. FINDINGS: Heart size is normal. Chronic aortic atherosclerosis. Power port inserted from a right jugular approach has its tip at the SVC RA junction. The lungs are clear except for mild scarring. No sign of active infiltrate, mass, effusion or collapse. IMPRESSION: No active cardiopulmonary disease. Electronically Signed   By: Nelson Chimes M.D.   On: 03/03/2018 12:58   Ct Head Wo Contrast  Result Date: 03/05/2018 CLINICAL DATA:  Headache beginning this afternoon. History of multiple myeloma, thrombocytopenia and stroke. EXAM: CT HEAD  WITHOUT CONTRAST TECHNIQUE: Contiguous axial images were obtained from the base of the skull through the vertex without intravenous contrast. COMPARISON:  MRI of the head March 03, 2018 and CT HEAD March 03, 2018. FINDINGS: BRAIN: Patchy LEFT > RIGHT parietal and LEFT occipital hypodensities are increasingly conspicuous from prior CT. No propagation. No intraparenchymal hemorrhage, mass effect or midline shift. Patchy LEFT frontal white matter hypodensities most compatible with chronic small vessel ischemic  changes. Old small LEFT cerebellar infarct. No parenchymal brain volume loss for age. No hydrocephalus. No abnormal extra-axial fluid collections. VASCULAR: Moderate calcific atherosclerosis of the carotid siphons. SKULL: No skull fracture. Moderate temporomandibular osteoarthrosis. No significant scalp soft tissue swelling. SINUSES/ORBITS: Trace paranasal sinus mucosal thickening. Mastoid air cells are well aerated.The included ocular globes and orbital contents are non-suspicious. Status post bilateral ocular lens implants. OTHER: None. IMPRESSION: 1. Involving acute LEFT > RIGHT parietal and LEFT occipital lobe infarcts without hemorrhagic conversion. 2. Mild-to-moderate chronic small vessel ischemic changes. Old small LEFT cerebellar infarct. Electronically Signed   By: Elon Alas M.D.   On: 03/05/2018 17:25   Ct Head Wo Contrast  Result Date: 03/03/2018 CLINICAL DATA:  Confused. Altered level of consciousness. Breast cancer. EXAM: CT HEAD WITHOUT CONTRAST TECHNIQUE: Contiguous axial images were obtained from the base of the skull through the vertex without intravenous contrast. COMPARISON:  02/26/2018. FINDINGS: Brain: Two separate areas of ill-defined abnormal cortical and subcortical edema in the LEFT hemisphere, affecting the LEFT parietal and LEFT occipital lobes, could represent multifocal infarcts versus rapid progression of metastatic disease. These areas were previously unremarkable on CT 5 days earlier. MRI of the brain without and with contrast recommended for further evaluation. Elsewhere, mild to moderate atrophy. Hypoattenuation of white matter, consistent with small vessel disease/post treatment effect. Vascular: Calcification of the cavernous internal carotid arteries consistent with cerebrovascular atherosclerotic disease. No signs of intracranial large vessel occlusion. Skull: Calvarium intact. Sinuses/Orbits: No sinus disease. Negative orbits. Other: None. IMPRESSION: Two separate  areas of abnormal cortical and subcortical edema in the LEFT hemisphere, affecting the LEFT parietal and LEFT occipital lobes, could represent multifocal infarcts versus rapid progression of metastatic disease. These were not present on the previous CT from 02/26/2018. MRI of the brain without and with contrast recommended for further evaluation. Electronically Signed   By: Staci Righter M.D.   On: 03/03/2018 13:26   Ct Head Wo Contrast  Result Date: 02/26/2018 CLINICAL DATA:  Blackouts for 1 month. History of multiple myeloma. EXAM: CT HEAD WITHOUT CONTRAST TECHNIQUE: Contiguous axial images were obtained from the base of the skull through the vertex without intravenous contrast. COMPARISON:  12/22/2015. FINDINGS: Brain: No evidence for acute infarction, hemorrhage, mass lesion, hydrocephalus, or extra-axial fluid. Mild atrophy. Hypoattenuation of white matter, consistent with small vessel disease or post treatment effect. 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 Vascular: Calcification of the cavernous internal carotid arteries consistent with cerebrovascular atherosclerotic disease. No signs of intracranial large vessel occlusion. Skull: Calvarium intact. Small lucency in the RIGHT frontal bone (series 3 image 50) unchanged from 2017. Sinuses/Orbits: No acute finding. Other: None. IMPRESSION: Atrophy and small vessel disease. No acute intracranial findings. Electronically Signed   By: Staci Righter M.D.   On: 02/26/2018 18:23   Ct Chest Wo Contrast  Result Date: 03/08/2018 CLINICAL DATA:  Large ulcerated gastric fundal mass found on upper endoscopy today. EXAM: CT CHEST, ABDOMEN AND PELVIS WITHOUT CONTRAST TECHNIQUE: Multidetector CT imaging of the chest, abdomen and pelvis was performed following the  standard protocol without IV contrast. COMPARISON:  CT chest 01/20/2018 FINDINGS: CT CHEST FINDINGS Cardiovascular: The heart size is normal. No substantial pericardial effusion. Coronary  artery calcification is evident. Atherosclerotic calcification is noted in the wall of the thoracic aorta. Right Port-A-Cath tip is positioned in the distal SVC near the junction with the RA. Mediastinum/Nodes: 12 mm short axis prevascular node seen on 17/3. Scattered small central mediastinal lymph nodes identified in the upper mediastinum. 12 mm short axis retrotracheal node visible on 16/3. No subcarinal or lower paraesophageal lymphadenopathy. No evidence for gross hilar lymphadenopathy although assessment is limited by the lack of intravenous contrast on today's study. There is no axillary lymphadenopathy. Lungs/Pleura: The central tracheobronchial airways are patent. 19 mm ring-like lesion in the right upper lobe is stable and may represent airway impaction with associated tree-in-bud nodularity. 6 mm medial right upper lobe nodule visible on 67/4. 5 mm right middle lobe nodule visible on 102/4, stable. 1.9 x 1.2 cm irregular opacity in the posterior left upper lobe is at the site of the 4.2 x 3.8 cm mass seen on PET-CT of 10/11/2015 and may reflect residual scarring. Small left pleural effusion evident. Musculoskeletal: No worrisome lytic or sclerotic osseous abnormality. CT ABDOMEN PELVIS FINDINGS Hepatobiliary: No focal abnormality in the liver on this study without intravenous contrast. Gallbladder surgically absent. No intrahepatic or extrahepatic biliary dilation. Pancreas: No focal mass lesion. No dilatation of the main duct. No intraparenchymal cyst. No peripancreatic edema. Spleen: Subtle 2.7 x 2.0 cm hypoattenuating lesion is identified in the dome of spleen, not definitely seen on prior. Adrenals/Urinary Tract: Right adrenal gland unremarkable. 1.6 cm left adrenal nodule stable since PET-CT of 10/01/2015. Cortical scarring noted in both kidneys. No evidence for hydroureter. The urinary bladder appears normal for the degree of distention. Stomach/Bowel: Tiny hiatal hernia noted. Wall thickening is  noted in the esophagogastric junction and proximal stomach. Wall thickening extends along the medial aspect of the proximal stomach. Distal stomach unremarkable. Duodenum is normally positioned as is the ligament of Treitz. No small bowel wall thickening. No small bowel dilatation. The terminal ileum is normal. The appendix is not visualized, but there is no edema or inflammation in the region of the cecum. No gross colonic mass. No colonic wall thickening. Vascular/Lymphatic: There is abdominal aortic atherosclerosis without aneurysm. Bulky lymphadenopathy is seen in the hepato duodenal ligament. 3.2 x 4.5 cm lymph node seen on 55/3. The 2.9 x 2.8 cm lymph node seen on 57/3. 10 mm short axis portal caval lymph node (63/3) is suspicious. No para-aortic retroperitoneal lymphadenopathy. No pelvic sidewall lymphadenopathy. Reproductive: The uterus is surgically absent. There is no adnexal mass. Other: No intraperitoneal free fluid. Musculoskeletal: No worrisome lytic or sclerotic osseous abnormality. IMPRESSION: 1. Marked wall thickening in the esophagogastric junction and proximal stomach, consistent with known neoplasm. Bulky lymphadenopathy in the gastrohepatic ligament is compatible with metastatic disease. 10 mm short axis portal caval lymph node also suspicious. 2. 2.7 cm hypoattenuating lesion in the spleen. This is not well assessed on today's noncontrast exam. Close attention recommended as metastatic disease not excluded. 3. Scattered tiny pulmonary nodules measuring up to 5 mm. Nonspecific, but close attention recommended as metastatic disease not excluded. 4. Mild lymphadenopathy noted in the upper mediastinum. 5. Circular lesion right upper lobe on axial imaging has a branching configuration on coronal and sagittal images. This is probably airway impaction related to atypical infection. Close follow-up recommended. 6. 1.9 x 1.2 cm irregular lesion posterior left upper lobe at  the site of a 4 cm mass on  PET-CT of 10/11/2015. Imaging features today are compatible with residua of that lesion. 7. 1.6 cm left adrenal nodule stable since 10/11/2015, likely benign. 8.  Aortic Atherosclerois (ICD10-170.0) Electronically Signed   By: Misty Stanley M.D.   On: 03/08/2018 19:19   Mr Brain Wo Contrast  Result Date: 03/03/2018 CLINICAL DATA:  Sudden onset confusion. EXAM: MRI HEAD WITHOUT CONTRAST TECHNIQUE: Multiplanar, multiecho pulse sequences of the brain and surrounding structures were obtained without intravenous contrast. COMPARISON:  Head CT 03/03/2018 FINDINGS: The examination had to be discontinued prior to completion due to altered mental status and inability to follow the technologist's instructions. Coronal and axial diffusion-weighted imaging, sagittal T1-weighted imaging and axial T2-weighted imaging were obtained. There is multifocal abnormal diffusion restriction within both hemispheres, including the left frontal lobe, both parietal lobes, left occipital lobe and both cerebellar hemispheres. The largest area of diffusion abnormalities in the posterior left parietal lobe. There is no midline shift or other mass effect. The midline structures are normal. There is moderate white matter hyperintense T2-weighted signal consistent with chronic microvascular ischemia. IMPRESSION: 1. Examination discontinued early due to patient altered mental status. 2. Multifocal bilateral acute ischemia within multiple vascular territories in both hemispheres. This may indicate a central cardiac or aortic embolic source. Electronically Signed   By: Ulyses Jarred M.D.   On: 03/03/2018 15:04   Ir Ivc Filter Plmt / S&i /img Guid/mod Sed  Result Date: 03/09/2018 INDICATION: Paradoxical embolus.  Calf vein DVT.  Pulmonary embolism. EXAM: IVC FILTER,INFERIOR VENA CAVOGRAM MEDICATIONS: None. ANESTHESIA/SEDATION: Fentanyl 75 mcg IV; Versed 1 mg IV Moderate Sedation Time:  17 minutes The patient was continuously monitored during the  procedure by the interventional radiology nurse under my direct supervision. FLUOROSCOPY TIME:  Fluoroscopy Time: 3 minutes 30 seconds (31 mGy). COMPLICATIONS: None immediate. PROCEDURE: Informed written consent was obtained from the patient after a thorough discussion of the procedural risks, benefits and alternatives. All questions were addressed. Maximal Sterile Barrier Technique was utilized including caps, mask, sterile gowns, sterile gloves, sterile drape, hand hygiene and skin antiseptic. A timeout was performed prior to the initiation of the procedure. The right neck was prepped with Betadine in a sterile fashion, and a sterile drape was applied covering the operative field. A sterile gown and sterile gloves were used for the procedure. The right jugular vein was noted to be patent initially with ultrasound. Under sonographic guidance, a micropuncture needle was inserted into the right jugular vein (Ultrasound image documentation was performed). It was removed over an 018 wire which was up-sized to a [place]. The sheath was inserted over the wire and into the IVC. IVC carbon dioxide venography was performed. The temporary [place] filter was then deployed in the infrarenal IVC. The sheath was removed and hemostasis was achieved with direct pressure. FINDINGS: IVC venography demonstrates renal inflow at the mid L2 level. There is no venous anomaly. There is no evidence of mega cava. Findings were corroborated with the accompanying CT abdomen. The final image demonstrates a deployed IVC filter with its tip at the lower L2 endplate. IMPRESSION: Successful infrarenal IVC filter placement. This is a temporary filter. It can be removed or remain in place to become permanent. PLAN: This IVC filter is potentially retrievable. The patient will be assessed for filter retrieval by Interventional Radiology in approximately 8-12 weeks. Further recommendations regarding filter retrieval, continued surveillance or declaration  of device permanence, will be made at that time. Electronically Signed  By: Marybelle Killings M.D.   On: 03/09/2018 12:15   Dg Abd Acute 2+v W 1v Chest  Result Date: 02/26/2018 CLINICAL DATA:  Lapse in memory. EXAM: DG ABDOMEN ACUTE W/ 1V CHEST COMPARISON:  None. FINDINGS: There is no evidence of dilated bowel loops or free intraperitoneal air. No radiopaque calculi or other significant radiographic abnormality is seen. Heart size and mediastinal contours are within normal limits. Both lungs are clear. Calcified tortuous aorta. Degenerative RIGHT SI joint. Port-A-Cath, RIGHT chest subclavian approach, tip RIGHT atrium. IMPRESSION: Negative abdominal radiographs. No acute cardiopulmonary disease. Electronically Signed   By: Staci Righter M.D.   On: 02/26/2018 18:16   Dg Swallowing Func-speech Pathology  Result Date: 03/06/2018 Objective Swallowing Evaluation: Type of Study: MBS-Modified Barium Swallow Study  Patient Details Name: PEG FIFER MRN: [id number] Date of Birth: January 27, 1940 Today's Date: 03/06/2018 Time: SLP Start Time (ACUTE ONLY): 0825 -SLP Stop Time (ACUTE ONLY): 0840 SLP Time Calculation (min) (ACUTE ONLY): 15 min Past Medical History: Past Medical History: Diagnosis Date . Anemia  . Anginal pain ([place])   used NTG x 2 May 31 and 06/15/13  . Anxiety  . Arthritis  . B12 deficiency 12/04/2014 . Breast cancer ([place])  . Complication of anesthesia  . COPD (chronic obstructive pulmonary disease) ([place])  . 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 Depression  . Dizziness  . Dyspnea  . Fibromyalgia  . Fibromyalgia  . GERD (gastroesophageal reflux disease)  . Headache(784.0)  . Heart murmur  . Hemoptysis 10/21/2015 . History of blood transfusion   last one May 12  . Hx of cardiovascular stress test   LexiScan with low level exercise Myoview (02/2013): No ischemia, EF 72%; normal study . Hx of echocardiogram   a.  Echocardiogram (12/26/2012): EF 85-27%, grade 1 diastolic dysfunction;   b.  Echocardiogram (02/2013):  EF 55-60%, no WMA, trivial effusion . Hyperkalemia  . Hyponatremia  . Hypotension  . Hypothyroidism  . Mucositis  . Multiple myeloma  . Myocardial infarction [facility])   in past, patient was unaware.  . Neuropathy  . Nodule of left lung 09/13/2015 . Pain in joint, pelvic region and thigh 07/07/2015 . Pneumonia   several . PONV (postoperative nausea and vomiting) 2008  after mastestomy Past Surgical History: Past Surgical History: Procedure Laterality Date . ABDOMINAL HYSTERECTOMY  1981 . AV FISTULA PLACEMENT Left 06/19/2013  Procedure: CREATION OF LEFT ARM ARTERIOVENOUS (AV) FISTULA ;  Surgeon: Angelia Mould, [place];  Location: [facility];  Service: Vascular;  Laterality: Left; . BREAST RECONSTRUCTION   . BREAST SURGERY Right   reduction . CATARACT EXTRACTION, BILATERAL   . CHOLECYSTECTOMY  1971 . COLONOSCOPY   . EYE SURGERY Bilateral   lens implant . history of Port removal   . LUNG BIOPSY  10/21/2015 . MASTECTOMY Left 2008 . PORTACATH PLACEMENT  12/2012  has had 2 . RESECTION OF ARTERIOVENOUS FISTULA ANEURYSM Left 06/04/2017  Procedure: LIGATION ANEURYSM OF LEFT ARTERIOVENOUS FISTULA;  Surgeon: Angelia Mould, Norris;  Location: [facility];  Service: Vascular;  Laterality: Left; . Status post stem cell transplant on September 28, 2008.   HPI: Patient is a 78 y/o female presents with confusion, amnesia and HA. Head CT- left occipital and left frontal parietial infarcts. MRI- multiple posterior/anterior bilateral infarcts. PMH includes multiple myeloma currently getting chemo, MI, COPD, hypotension, breast ca, fibromyalgia.  Subjective: pleasant Assessment / Plan / Recommendation CHL IP CLINICAL IMPRESSIONS 03/06/2018 Clinical Impression Pt demonstrated reduced lingual retraction which resulted in mild vallecular residue  and transient penetration (PAS 2) with thin liquids when consecutive swallows were used. [name] independent use of dry secondary swallows was effective in reducing the residue and residue was eliminated with use  of aliquid wash. Overall, her swallow mechanism appears to be within functional limits with consideration of her age and she does not appear to be at significant risk of aspiration at this time. It is recommended that a regular texture diet be continued but, with consideration of her reported xerostomia, liquid washes may be beneficial to improve bolus flow. SLP will continue to follow for cognitive-linguisitic treatment and will see the pt once more for swallowing to ensure her observance of compensatory strategies. SLP Visit Diagnosis Dysphagia, unspecified (R13.10) Attention and concentration deficit following -- Frontal lobe and executive function deficit following -- Impact on safety and function No limitations   CHL IP TREATMENT RECOMMENDATION 03/06/2018 Treatment Recommendations Therapy as outlined in treatment plan below   Prognosis 03/06/2018 Prognosis for Safe Diet Advancement Good Barriers to Reach Goals Cognitive deficits Barriers/Prognosis Comment -- CHL IP DIET RECOMMENDATION 03/06/2018 SLP Diet Recommendations Regular solids;Thin liquid Liquid Administration via Cup;Straw Medication Administration Whole meds with liquid Compensations Small sips/bites;Slow rate;Follow solids with liquid Postural Changes Remain semi-upright after after feeds/meals (Comment);Seated upright at 90 degrees   No flowsheet data found.  CHL IP FOLLOW UP RECOMMENDATIONS 03/06/2018 Follow up Recommendations Inpatient Rehab   CHL IP FREQUENCY AND DURATION 03/06/2018 Speech Therapy Frequency (ACUTE ONLY) min 2x/week Treatment Duration 2 weeks      CHL IP ORAL PHASE 03/06/2018 Oral Phase WFL Oral - Pudding Teaspoon -- Oral - Pudding Cup -- Oral - Honey Teaspoon -- Oral - Honey Cup -- Oral - Nectar Teaspoon -- Oral - Nectar Cup -- Oral - Nectar Straw -- Oral - Thin Teaspoon -- Oral - Thin Cup -- Oral - Thin Straw -- Oral - Puree -- Oral - Mech Soft -- Oral - Regular -- Oral - Multi-Consistency -- Oral - Pill -- Oral Phase - Comment --   CHL IP PHARYNGEAL PHASE 03/06/2018 Pharyngeal Phase WFL Pharyngeal- Pudding Teaspoon -- Pharyngeal -- Pharyngeal- Pudding Cup -- Pharyngeal -- Pharyngeal- Honey Teaspoon -- Pharyngeal -- Pharyngeal- Honey Cup -- Pharyngeal -- Pharyngeal- Nectar Teaspoon -- Pharyngeal -- Pharyngeal- Nectar Cup -- Pharyngeal -- Pharyngeal- Nectar Straw -- Pharyngeal -- Pharyngeal- Thin Teaspoon -- Pharyngeal -- Pharyngeal- Thin Cup Penetration/Aspiration during swallow Pharyngeal Material enters airway, remains ABOVE vocal cords then ejected out Pharyngeal- Thin Straw Penetration/Aspiration during swallow Pharyngeal Material enters airway, remains ABOVE vocal cords then ejected out Pharyngeal- Puree Penetration/Aspiration before swallow;Penetration/Aspiration during swallow;Pharyngeal residue - valleculae Pharyngeal Material enters airway, remains ABOVE vocal cords then ejected out Pharyngeal- Mechanical Soft -- Pharyngeal -- Pharyngeal- Regular Pharyngeal residue - valleculae Pharyngeal -- Pharyngeal- Multi-consistency -- Pharyngeal -- Pharyngeal- Pill -- Pharyngeal -- Pharyngeal Comment --  Shanika I. Hardin Negus, [place], [place] Office number [phone] Pager [place] 03/06/2018, 9:42 AM              Mr [place] Wo Contrast  Result Date: 03/03/2018 CLINICAL DATA:  Stroke follow-up EXAM: MRA HEAD WITHOUT CONTRAST TECHNIQUE: Angiographic images of the Circle of Willis were obtained using MRA technique without intravenous contrast. COMPARISON:  Brain MRI 03/03/2018 FINDINGS: POSTERIOR CIRCULATION: --Basilar artery: Normal. --Posterior cerebral arteries: Normal. Both originate from the basilar artery. --Superior cerebellar arteries: Normal. --Inferior cerebellar arteries: Normal anterior and posterior inferior cerebellar arteries. ANTERIOR CIRCULATION: --Intracranial internal carotid arteries: Normal. --Anterior cerebral arteries: Normal. Both A1  segments are present. Patent anterior  communicating artery. --Middle cerebral arteries: Normal. --Posterior communicating arteries: Absent bilaterally. IMPRESSION: Normal intracranial MRA. Electronically Signed   By: Ulyses Jarred M.D.   On: 03/03/2018 22:51   Vas US Carotid (at [place] Only)  Result Date: 03/06/2018 Carotid Arterial Duplex Study Indications: CVA. Performing Technologist: Oliver Hum RVT  Examination Guidelines: A complete evaluation includes B-mode imaging, spectral Doppler, color Doppler, and power Doppler as needed of all accessible portions of each vessel. Bilateral testing is considered an integral part of a complete examination. Limited examinations for reoccurring indications may be performed as noted.  Right Carotid Findings: +----------+--------+-------+--------+----------------------+------------------+           PSV cm/sEDV    StenosisDescribe              Comments                             cm/s                                                    +----------+--------+-------+--------+----------------------+------------------+ CCA Prox  68      18                                   intimal thickening +----------+--------+-------+--------+----------------------+------------------+ CCA Distal77      20                                   intimal thickening +----------+--------+-------+--------+----------------------+------------------+ ICA Prox  72      21             smooth and                                                                heterogenous                             +----------+--------+-------+--------+----------------------+------------------+ ICA Distal70      27                                                      +----------+--------+-------+--------+----------------------+------------------+ ECA       63      13                                                      +----------+--------+-------+--------+----------------------+------------------+  +----------+--------+-------+--------+-------------------+           PSV cm/sEDV cmsDescribeArm Pressure (mmHG) +----------+--------+-------+--------+-------------------+ [id number]                                         +----------+--------+-------+--------+-------------------+ +---------+--------+--+--------+-+---------+  VertebralPSV cm/s31EDV cm/s9Antegrade +---------+--------+--+--------+-+---------+  Left Carotid Findings: +----------+--------+--------+--------+-----------------------+--------+           PSV cm/sEDV cm/sStenosisDescribe               Comments +----------+--------+--------+--------+-----------------------+--------+ CCA Prox  82      23              smooth and heterogenous         +----------+--------+--------+--------+-----------------------+--------+ CCA Distal64      19              smooth and heterogenous         +----------+--------+--------+--------+-----------------------+--------+ ICA Prox  66      20              smooth and heterogenous         +----------+--------+--------+--------+-----------------------+--------+ ICA Distal81      32                                              +----------+--------+--------+--------+-----------------------+--------+ ECA       70      17                                              +----------+--------+--------+--------+-----------------------+--------+ +----------+--------+--------+--------+-------------------+ SubclavianPSV cm/sEDV cm/sDescribeArm Pressure (mmHG) +----------+--------+--------+--------+-------------------+           96                                          +----------+--------+--------+--------+-------------------+ +---------+--------+--+--------+--+---------+ VertebralPSV cm/s54EDV cm/s19Antegrade +---------+--------+--+--------+--+---------+  Summary: Right Carotid: Velocities in the right ICA are consistent with a 1-39% stenosis. Left Carotid:  Velocities in the left ICA are consistent with a 1-39% stenosis. Vertebrals: Bilateral vertebral arteries demonstrate antegrade flow. *See table(s) above for measurements and observations.  Electronically signed by Antony Contras Norris on 03/06/2018 at 11:57:28 AM.    Final    Vas [place] Lower Extremity Venous (dvt)  Result Date: 03/07/2018  Lower Venous Study Other Indications: Stroke with PFO. Performing Technologist: June Leap RDMS, RVT  Examination Guidelines: A complete evaluation includes B-mode imaging, spectral Doppler, color Doppler, and power Doppler as needed of all accessible portions of each vessel. Bilateral testing is considered an integral part of a complete examination. Limited examinations for reoccurring indications may be performed as noted.  Right Venous Findings: +---------+---------------+---------+-----------+----------+-------------------+          CompressibilityPhasicitySpontaneityPropertiesSummary             +---------+---------------+---------+-----------+----------+-------------------+ CFV      Full           Yes      Yes                                      +---------+---------------+---------+-----------+----------+-------------------+ SFJ      Full                                                             +---------+---------------+---------+-----------+----------+-------------------+  FV Prox  Full                                                             +---------+---------------+---------+-----------+----------+-------------------+ FV Mid   Full                                                             +---------+---------------+---------+-----------+----------+-------------------+ FV DistalFull                                                             +---------+---------------+---------+-----------+----------+-------------------+ PFV      Full                                                              +---------+---------------+---------+-----------+----------+-------------------+ POP      Full           Yes      Yes                                      +---------+---------------+---------+-----------+----------+-------------------+ PTV      Full                                         not well visualized +---------+---------------+---------+-----------+----------+-------------------+ PERO     Full                                         not well visualized +---------+---------------+---------+-----------+----------+-------------------+  Left Venous Findings: +---------+---------------+---------+-----------+----------+------------------+          CompressibilityPhasicitySpontaneityPropertiesSummary            +---------+---------------+---------+-----------+----------+------------------+ CFV      Full           Yes      Yes                                     +---------+---------------+---------+-----------+----------+------------------+ SFJ      Full                                                            +---------+---------------+---------+-----------+----------+------------------+ FV Prox  Full                                                            +---------+---------------+---------+-----------+----------+------------------+  FV Mid   Full                                                            +---------+---------------+---------+-----------+----------+------------------+ FV DistalFull                                                            +---------+---------------+---------+-----------+----------+------------------+ PFV      Full                                                            +---------+---------------+---------+-----------+----------+------------------+ POP      Full           Yes      Yes                                     +---------+---------------+---------+-----------+----------+------------------+  PTV      None                                         isolated proximal                                                        calf               +---------+---------------+---------+-----------+----------+------------------+ PERO     None                                         isolated proximal                                                        calf               +---------+---------------+---------+-----------+----------+------------------+    Summary: Right: There is no evidence of deep vein thrombosis in the lower extremity. No cystic structure found in the popliteal fossa. Left: Findings consistent with acute deep vein thrombosis involving the left posterior tibial vein, and left peroneal vein. No cystic structure found in the popliteal fossa.  *See table(s) above for measurements and observations. Electronically signed by Deitra Mayo Norris on 03/07/2018 at 7:51:58 PM.    Final    Dg Esophagus W Single Cm (sol Or Thin Ba)  Result Date: 03/06/2018 CLINICAL DATA:  Dysphagia EXAM: ESOPHOGRAM/BARIUM SWALLOW TECHNIQUE: Single contrast examination was performed using thick  and thin barium FLUOROSCOPY TIME:  Fluoroscopy Time:  1 minutes, 48 seconds Radiation Exposure Index (if provided by the fluoroscopic device): 17.9 mGy Number of Acquired Spot Images: None COMPARISON:  CT chest from 01/20/2018 FINDINGS: The patient was relatively frail, and I elected not to give gas crystals. With patient laying down, swallows were performed in the LPO position. Mild distal esophageal fold thickening. No obvious ulceration. Mildly dilated esophagus. Primary peristaltic waves in the esophagus were disrupted on 4/4 swallows in the mid esophagus level. Initial swallows demonstrate some lobularity and irregularity along the upper margin of the gastroesophageal junction for example on image 35/1. Tumor or ulceration not readily excluded given this appearance. In the LPO position, there is poor  filling of the gastroesophageal junction, with only slow percolated shin of contrast from the dilated esophagus into the stomach. A 13 mm barium tablet passed without difficulty into the stomach. Limited assessment of the upper stomach demonstrates no appreciable gastric diverticulum along the right margin of the upper stomach, raising concern that the rounded density in this vicinity on the recent chest CT from 01/20/2018 measuring 3.6 by 2.6 cm on that exam probably represents gastrohepatic ligament pathologic adenopathy rather than a diverticulum. IMPRESSION: 1. Nonspecific esophageal dysmotility disorder with disruption of primary peristaltic waves in the mid esophagus. 2. Distal esophageal irregularity potentially with ulceration. Difficult to exclude tumor of the distal esophagus. Endoscopy is recommended. 3. 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 Electronically Signed   By: Van Clines M.D.   On: 03/06/2018 09:31    Microbiology: Recent Results (from the past 240 hour(s))  Culture, blood (Routine x 2)     Status: None   Collection Time: 03/03/18 12:27 PM  Result Value Ref Range Status   Specimen Description BLOOD PORTA CATH  Final   Special Requests   Final    BOTTLES DRAWN AEROBIC AND ANAEROBIC Blood Culture adequate volume Performed at [facility] [address] [place], [place] [address]    Culture NO GROWTH 5 DAYS  Final   Report Status 03/08/2018 FINAL  Final  Culture, blood (Routine x 2)     Status: Abnormal   Collection Time: 03/03/18  1:41 PM  Result Value Ref Range Status   Specimen Description   Final    BLOOD RIGHT ANTECUBITAL Performed at [facility], [place] [address] [place], [place] [address]     Special Requests   Final    BOTTLES DRAWN AEROBIC AND ANAEROBIC Blood Culture adequate volume Performed at [facility] [address], [place], [place] [address]    Culture  Setup Time   Final    GRAM POSITIVE COCCI ANAEROBIC BOTTLE ONLY CRITICAL RESULT CALLED TO, READ BACK BY AND VERIFIED WITH: Jene Every PHARMD [id number] 03/04/18 A BROWNING Performed at [facility], [place] [address] [place], [place] [address]    Culture (A)  Final    STAPHYLOCOCCUS SPECIES (COAGULASE NEGATIVE) DIPHTHEROIDS(CORYNEBACTERIUM SPECIES) THE SIGNIFICANCE OF ISOLATING THIS ORGANISM FROM A SINGLE SET OF BLOOD CULTURES WHEN MULTIPLE SETS ARE DRAWN IS UNCERTAIN. PLEASE NOTIFY THE MICROBIOLOGY DEPARTMENT WITHIN ONE WEEK IF SPECIATION AND SENSITIVITIES ARE REQUIRED. CRITICAL RESULT CALLED TO, READ BACK BY AND VERIFIED WITH: [place], L [id number] FCP  Report Status 03/09/2018 FINAL  Final  Blood Culture ID Panel (Reflexed)     Status: Abnormal   Collection Time: 03/03/18  1:41 PM  Result Value Ref Range Status   Enterococcus species NOT DETECTED NOT DETECTED Final   Listeria monocytogenes NOT DETECTED NOT DETECTED Final   Staphylococcus species DETECTED (A) NOT DETECTED Final    Comment: Methicillin (oxacillin) resistant coagulase negative staphylococcus. Possible blood culture contaminant (unless isolated from more than one blood culture draw or clinical case suggests pathogenicity). No antibiotic treatment is indicated for blood  culture contaminants. CRITICAL RESULT CALLED TO, READ BACK BY AND VERIFIED WITH: Jene Every PHARMD 1840 03/04/18 A BROWNING    Staphylococcus aureus (BCID) NOT DETECTED NOT DETECTED Final   Methicillin resistance DETECTED (A) NOT DETECTED Final    Comment: CRITICAL RESULT CALLED TO, READ BACK BY AND VERIFIED WITH: Jene Every PHARMD 1840 03/04/18 A BROWNING    Streptococcus species NOT DETECTED NOT DETECTED Final   Streptococcus agalactiae NOT DETECTED NOT DETECTED Final    Streptococcus pneumoniae NOT DETECTED NOT DETECTED Final   Streptococcus pyogenes NOT DETECTED NOT DETECTED Final   Acinetobacter baumannii NOT DETECTED NOT DETECTED Final   Enterobacteriaceae species DETECTED (A) NOT DETECTED Final    Comment: Enterobacteriaceae represent a large family of gram-negative bacteria, not a single organism. CRITICAL RESULT CALLED TO, READ BACK BY AND VERIFIED WITH: Jene Every PHARMD 1840 03/04/18 A BROWNING    Enterobacter cloacae complex NOT DETECTED NOT DETECTED Final   Escherichia coli NOT DETECTED NOT DETECTED Final   Klebsiella oxytoca NOT DETECTED NOT DETECTED Final   Klebsiella pneumoniae NOT DETECTED NOT DETECTED Final   Proteus species DETECTED (A) NOT DETECTED Final    Comment: CRITICAL RESULT CALLED TO, READ BACK BY AND VERIFIED WITH: Jene Every PHARMD 1840 03/04/18 A BROWNING    Serratia marcescens NOT DETECTED NOT DETECTED Final   Carbapenem resistance NOT DETECTED NOT DETECTED Final   Haemophilus influenzae NOT DETECTED NOT DETECTED Final   Neisseria meningitidis NOT DETECTED NOT DETECTED Final   Pseudomonas aeruginosa NOT DETECTED NOT DETECTED Final   Candida albicans NOT DETECTED NOT DETECTED Final   Candida glabrata NOT DETECTED NOT DETECTED Final   Candida krusei NOT DETECTED NOT DETECTED Final   Candida parapsilosis NOT DETECTED NOT DETECTED Final   Candida tropicalis NOT DETECTED NOT DETECTED Final    Comment: Performed at [facility], [place] [address] [place], [place] [address]     Labs: Basic Metabolic Panel: Recent Labs  Lab 03/06/18 0500 03/07/18 0550 03/08/18 0532 03/09/18 0423 03/10/18 0608 03/11/18 0500  NA 140 140 140 137  --  140  K 4.8 3.3* 3.6 3.6  --  3.9  CL 106 106 108 105  --  108  CO2 22 26 21* 23  --  27  GLUCOSE 100* 101* 95 102*  --  105*  BUN '17 16 14 13  '$ --  13  CREATININE 2.56* 2.76* 2.47* 2.34*  --  2.13*  CALCIUM 6.1* 8.5* 8.2* 8.3*  --  8.8*  MG  --   --   --   --  2.2  --    Liver Function  Tests: No results for input(s): AST, ALT, ALKPHOS, BILITOT, PROT, ALBUMIN in the last 168 hours. No results for input(s): LIPASE, AMYLASE in the last 168 hours. No results for input(s): AMMONIA in the last 168 hours. CBC: Recent Labs  Lab 03/05/18 1106 03/06/18 0500 03/07/18 0550 03/08/18 0532 03/09/18 0423 03/11/18  0500  WBC 2.0* 2.0* 2.3* 2.7* 3.3* 3.7*  NEUTROABS 0.8* 0.8*  --  1.2* 1.6* 2.4  HGB 7.0* 7.2* 7.4* 7.3* 7.2* 8.6*  HCT 23.0* 22.1* 23.3* 24.1* 23.5* 26.9*  MCV 103.1* 101.4* 102.6* 103.4* 103.1* 101.5*  PLT 62* 66* 72* 72* 67* 52*   Cardiac Enzymes: Recent Labs  Lab 03/10/18 1123 03/10/18 1735 03/10/18 2353  TROPONINI 2.06* 2.43* 2.15*   BNP: BNP (last 3 results) Recent Labs    03/13/17 1212 01/20/18 1949  BNP 77 102.6*    ProBNP (last 3 results) No results for input(s): PROBNP in the last 8760 hours.  CBG: No results for input(s): GLUCAP in the last 168 hours.     Signed:  Florencia Reasons MD, PhD  [facility] 03/11/2018, 11:06 AM

## 2018-03-11 NOTE — H&P (Signed)
Physical Medicine and Rehabilitation Admission H&P    Chief Complaint  Patient presents with  . Altered Mental Status  : HPI: Nancy Norris is a 78 year old right-handed female with past medical history of multiple myeloma in remission on treatment with systemic chemotherapy and followed by Dr. Earlie Server, remote breast cancer, COPD with remote tobacco abuse 13 years ago, chronic anemia/thrombocytopenia, fibromyalgia, hypertension,CKD stage IV with creatinine 2.29-2.93. Per chart review and patient, patient lives alone and reportedly independent prior to admission. One level home with ramped entrance. Presented 03/03/2018 with altered mental status,dysphagia, headache 2 weeks, lactic acid 1.2, elevated ammonia level. She was recently seen in the ED on 02/26/2018 for episodes of dizziness and headache as well as a fall. CT of the head was negative. Noted hemoglobin 7.9 with noted history of chronic anemia, urine culture greater than 100,000 Escherichia coli she was discharged to home after being placed on Keflex. Upon this admission cranial CT scan repeated showing 2 separate areas of abnormal cortical and subcortical edema in the left hemisphere affecting the left parietal lobe occipital lobes that were not present on 02/26/2018. MRI the brain reviewed showing left greater than right small to moderate sized multifocal infarcts.  Per report, acute ischemia within multiple vascular territories in both hemispheres. MRA unremarkable. Patient did not receive TPA. Echocardiogram 03/05/2018 showed ejection fraction 65%.  Normal systolic function. Carotid Dopplers with no ICA stenosis. Blood cultures positive for staph species and Proteus started on vancomycin and ceftriaxone 03/06/2018 swallow study completed as well as esophagram 03/06/2018 showing nonspecific esophageal dysmotility no appreciable upper gastric diverticulum and currently maintained on a regular diet. TEE completed 03/07/2018 with ejection  fraction 65% and no thrombus or vegetation. Small PFO seen by color Doppler. Patient remained on aspirin for CVA prophylaxis. Gastroenterology consulted Dr. Penelope Coop for evaluation of noted abnormal barium swallow, anemia question GI bleed. An upper GI endoscopy completed 03/08/2020 per Dr.Schooler that showed a large fungating malignant appearing ulcerated proximal stomach mass that had areas of blood seen consistent with recent bleeding and biopsies taken. Mass likely causing partial obstruction with her by mouth intake. Recommendations were for CT of abdomen and pelvis completed showing marked wall thickening in the esophagogastric junction and proximal stomach consistent with known neoplasm. Bulky lymphadenopathy in the gastrohepatic ligament compatible with metastatic disease. There was a 2.7 cm hypoattenuating lesion in the spleen as well as scattered tiny pulmonary nodules measuring up to 5 mm and mild lymphadenopathy noted in the upper mediastinum. 1.9 x 1.2 cm irregular lesion posterior left upper lobe at the site of a 4 cm mass that was also noted on PET scan 10/11/2015 features compatible with residual of that lesion. Oncology services Dr. Earlie Server follow-up in regards to findings with pathology pending and current plan is to await biopsy report from gastric mass to give further recommendations about management suspicious malignancy. Hospital course complicated by findings of acute DVT left posterior tibial vein, and left peroneal vein with radiology services consulted 03/09/2018 and underwent IVC filter placement per Dr. Barbie Banner of interventional radiology. On 03/10/2018 cardiology again follow-up for nonspecific chest discomfort and dizziness with elevated troponin 2.15-2.43.EKG did show new Q waves on her ECG in the inferior leads.  Repeat echocardiogram showing ejection fraction of 83% normal systolic function. The right ventricle normal systolic function. No increase in right ventricular wall thickness. It  was discussed possible need for cardiac catheterization and patient not interested at this time. Patient is currently on a clear liquid diet advance  as tolerated. Hemoglobin holding stable at 8.6. Therapy evaluations completed with recommendations of physical medicine rehabilitation consult. Patient was admitted for a comprehensive rehabilitation program.  Review of Systems  Constitutional: Negative for chills and fever.  HENT: Positive for nosebleeds. Negative for hearing loss.   Eyes: Negative for blurred vision.  Respiratory: Negative for cough and shortness of breath.   Cardiovascular: Negative for chest pain and palpitations.       Nonspecific anginal chest pain  Gastrointestinal:       GERD  Genitourinary: Negative for dysuria, flank pain, hematuria and urgency.  Musculoskeletal: Positive for joint pain and myalgias.  Skin: Negative for rash.  Neurological: Positive for dizziness and focal weakness.  Psychiatric/Behavioral: Positive for depression.       Anxiety  All other systems reviewed and are negative.  Past Medical History:  Diagnosis Date  . Anemia   . Anginal pain ([place])    used NTG x 2 May 31 and 06/15/13   . Anxiety   . Arthritis   . B12 deficiency 12/04/2014  . Breast cancer ([place])   . Complication of anesthesia   . COPD (chronic obstructive pulmonary disease) ([place])   . 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 Depression   . Dizziness   . Dyspnea   . Fibromyalgia   . Fibromyalgia   . GERD (gastroesophageal reflux disease)   . Headache(784.0)   . Heart murmur   . Hemoptysis 10/21/2015  . History of blood transfusion    last one May 12   . Hx of cardiovascular stress test    LexiScan with low level exercise Myoview (02/2013): No ischemia, EF 72%; normal study  . Hx of echocardiogram    a.  Echocardiogram (12/26/2012): EF 32-54%, grade 1 diastolic dysfunction;   b.  Echocardiogram (02/2013): EF 55-60%, no WMA, trivial effusion  . Hyperkalemia   . Hyponatremia    . Hypotension   . Hypothyroidism   . Mucositis   . Multiple myeloma   . Myocardial infarction [facility])    in past, patient was unaware.   . Neuropathy   . Nodule of left lung 09/13/2015  . Pain in joint, pelvic region and thigh 07/07/2015  . Pneumonia    several  . PONV (postoperative nausea and vomiting) 2008   after mastestomy   Past Surgical History:  Procedure Laterality Date  . ABDOMINAL HYSTERECTOMY  1981  . AV FISTULA PLACEMENT Left 06/19/2013   Procedure: CREATION OF LEFT ARM ARTERIOVENOUS (AV) FISTULA ;  Surgeon: Angelia Mould, MD;  Location: [facility];  Service: Vascular;  Laterality: Left;  . BIOPSY  03/08/2018   Procedure: BIOPSY;  Surgeon: Wilford Corner, MD;  Location: [facility];  Service: Endoscopy;;  . BREAST RECONSTRUCTION    . BREAST SURGERY Right    reduction  . CATARACT EXTRACTION, BILATERAL    . CHOLECYSTECTOMY  1971  . COLONOSCOPY    . ESOPHAGOGASTRODUODENOSCOPY (EGD) WITH PROPOFOL N/A 03/08/2018   Procedure: ESOPHAGOGASTRODUODENOSCOPY (EGD) WITH PROPOFOL;  Surgeon: Wilford Corner, MD;  Location: [facility];  Service: Endoscopy;  Laterality: N/A;  . EYE SURGERY Bilateral    lens implant  . history of Port removal    . IR IVC FILTER PLMT / S&I /IMG GUID/MOD SED  03/09/2018  . LUNG BIOPSY  10/21/2015  . MASTECTOMY Left 2008  . PORTACATH PLACEMENT  12/2012   has had 2  . RESECTION OF ARTERIOVENOUS FISTULA ANEURYSM Left 06/04/2017   Procedure: LIGATION ANEURYSM OF LEFT ARTERIOVENOUS FISTULA;  Surgeon:  Angelia Mould, MD;  [facility];  Service: Vascular;  Laterality: Left;  . Status post stem cell transplant on September 28, 2008.    . TEE WITHOUT CARDIOVERSION N/A 03/07/2018   Procedure: TRANSESOPHAGEAL ECHOCARDIOGRAM (TEE);  Surgeon: Buford Dresser, MD;  Location: [facility] ENDOSCOPY;  Service: Cardiovascular;  Laterality: N/A;   Family History  Problem Relation Age of Onset  . Arthritis Mother   . Asthma Mother   . Cancer Sister     . Hyperlipidemia Brother    Social History:  reports that she quit smoking about 13 years ago. Her smoking use included cigarettes. She has a 30.00 pack-year smoking history. She has never used smokeless tobacco. She reports that she does not drink alcohol or use drugs. Allergies:  Allergies  Allergen Reactions  . Codeine Anaphylaxis    anaphylasix tolarates oxycodone per care cast  . Latex Shortness Of Breath  . Chocolate     migraines  . Onion Other (See Comments)    Causes migraine headaches  . Zyprexa [Olanzapine] Other (See Comments)    Confusion , dizzy,unsteady  . Adhesive [Tape] Other (See Comments)    blisters  . Heparin Rash    Can flush port with heparin  . Hydrocodone Rash    With extreme itching  . Iodinated Diagnostic Agents Hives, Itching, Rash and Other (See Comments)    Happened 60 years ago Stage IV kidney function  . Oxycodone Rash  . Promethazine-Dm Hives  . Sulfa Antibiotics Itching   Facility-Administered Medications Prior to Admission  Medication Dose Route Frequency Provider Last Rate Last Dose  . ipratropium-albuterol (DUONEB) 0.5-2.5 (3) MG/3ML nebulizer solution 3 mL  3 mL Nebulization Once Liane Comber, NP       Medications Prior to Admission  Medication Sig Dispense Refill  . acetaminophen (TYLENOL) 500 MG tablet Take 1,000 mg by mouth daily as needed for moderate pain or headache.    . albuterol (PROVENTIL HFA;VENTOLIN HFA) 108 (90 Base) MCG/ACT inhaler Inhale 1-2 puffs into the lungs every 6 (six) hours as needed for wheezing or shortness of breath (cough). 1 Inhaler 3  . azelastine (ASTELIN) 0.1 % nasal spray Place 1 spray into both nostrils daily. Use in each nostril as directed 30 mL 2  . buPROPion (WELLBUTRIN) 75 MG tablet Take 1 tablet (75 mg total) by mouth 2 (two) times daily. 60 tablet 2  . cephALEXin (KEFLEX) 500 MG capsule Take 1 capsule (500 mg total) by mouth 3 (three) times daily. 21 capsule 0  . citalopram (CELEXA) 40 MG tablet  Take 1 tablet daily for Mood 90 tablet 1  . cromolyn (OPTICROM) 4 % ophthalmic solution INSTILL 1 TO 2 DROPS IN AFFECTED EYE(S) 4 TIMES A DAY FOR ALLERGIES. (Patient taking differently: Place 1-2 drops into both eyes 4 (four) times daily as needed (allergies). ) 10 mL PRN  . dexamethasone (DECADRON) 4 MG tablet Take 4 mg by mouth. 5 tabs on day 1 of chemo  0  . folic acid (FOLVITE) 1 MG tablet Take 1 tablet (1 mg total) by mouth daily. 30 tablet 3  . furosemide (LASIX) 40 MG tablet TAKE 1 TABLET BY MOUTH DAILY. (Patient taking differently: Take 40 mg by mouth daily as needed for fluid or edema. ) 90 tablet 1  . gabapentin (NEURONTIN) 300 MG capsule TAKE 1 CAPSULE BY MOUTH 3 TIMES DAILY. (Patient taking differently: Take 300-600 mg by mouth See admin instructions. 600 mg at bedtime and an additional 300 mg during the  day if "cramping") 90 capsule 1  . ipratropium-albuterol (DUONEB) 0.5-2.5 (3) MG/3ML SOLN Take 3 mLs by nebulization every 4 (four) hours as needed. Max:6 doses per day 540 mL 0  . levocetirizine (XYZAL) 5 MG tablet Take 1 tablet (5 mg total) by mouth every evening. 90 tablet 3  . levothyroxine (SYNTHROID, LEVOTHROID) 150 MCG tablet TAKE 1 TABLET BY MOUTH DAILY. NEED OFFICE VISIT FOR FURTHER REFILLS. (Patient taking differently: Take by mouth daily before breakfast. TAKE 1 TABLET BY MOUTH DAILY. NEED OFFICE VISIT FOR FURTHER REFILLS.) 90 tablet 0  . loperamide (IMODIUM) 2 MG capsule Take 1 capsule (2 mg total) by mouth as needed for diarrhea or loose stools. 30 capsule 2  . magic mouthwash SOLN Take 5 mLs by mouth 4 (four) times daily as needed for mouth pain. 240 mL 1  . midodrine (PROAMATINE) 10 MG tablet Take 1 tablet daily to support BP (Patient taking differently: Take 10 mg by mouth daily. ) 90 tablet 3  . montelukast (SINGULAIR) 10 MG tablet Take 1 tablet daily for Allergies 90 tablet 1  . nystatin (MYCOSTATIN/NYSTOP) powder Apply topically 2 (two) times daily. 30 g 0  . ondansetron  (ZOFRAN ODT) 8 MG disintegrating tablet Take 1 tablet (8 mg total) by mouth every 8 (eight) hours as needed for nausea or vomiting. 20 tablet 0  . pilocarpine (SALAGEN) 5 MG tablet TAKE 1 TABLET BY MOUTH 3 TIMES DAILY FOR DRY MOUTH. (Patient taking differently: Take 5 mg by mouth 2 (two) times daily. ) 270 tablet 1  . ranitidine (ZANTAC) 300 MG tablet Take 1 tablet 2 x /day at [facility] & Bedtime for Heart burn & Reflux (Patient taking differently: Take 300 mg by mouth 2 (two) times daily. ) 180 tablet 3  . sucralfate (CARAFATE) 1 g tablet Take 1 tablet dissolved in water  4 x /day BEFORE Meals & Bedtime for Acid indigestion & Reflux (Patient taking differently: Take 1 g by mouth 4 (four) times daily -  with meals and at bedtime. Take 1 tablet dissolved in water  4 x /day BEFORE Meals & Bedtime for Acid indigestion & Reflux) 360 tablet 1  . traMADol (ULTRAM) 50 MG tablet Take 1 to 2 tablets by mouth 4 times a day as needed for pain. (Patient taking differently: Take 50-100 mg by mouth 4 (four) times daily as needed for moderate pain. Take 1 to 2 tablets by mouth 4 times a day as needed for pain.) 180 tablet 0  . vitamin B-12 (CYANOCOBALAMIN) 1000 MCG tablet Take 1 tablet (1,000 mcg total) by mouth daily. (Patient taking differently: Take 2,000 mcg by mouth daily. ) 30 tablet 0  . fluconazole (DIFLUCAN) 150 MG tablet Take one tablet at onset of symptoms and second tablet three days later for yeast. (Patient not taking: Reported on 03/03/2018) 2 tablet 0  . nitroGLYCERIN (NITROSTAT) 0.4 MG SL tablet Place 0.4 mg under the tongue every 5 (five) minutes as needed for chest pain.     Marland Kitchen umeclidinium-vilanterol (ANORO ELLIPTA) 62.5-25 MCG/INH AEPB Inhale 1 puff into the lungs daily as needed (shortness of breath). (Patient not taking: Reported on 03/03/2018) 1 each 1    Drug Regimen Review Drug regimen was reviewed and remains appropriate with no significant issues identified  Home: Home Living Family/patient  expects to be discharged to:: Private residence Living Arrangements: Alone Available Help at Discharge: Neighbor, Available PRN/intermittently, Family Type of Home: Apartment Home Access: Ramped entrance Home Layout: One level Bathroom Shower/Tub: Tub/shower  unit Bathroom Toilet: Standard Bathroom Accessibility: Yes Home Equipment: Environmental consultant - 2 wheels, [employer] - single point, Civil engineer, contracting, Environmental consultant - 4 wheels  Lives With: Alone   Functional History: Prior Function Level of Independence: Independent Comments: Rides [employer] for transportation; walks without DME. Does cooking/cleaning. ; cancer center provides rides; yeasr of Chemo left; enjoys "diamond" painting; enjoys crafts; does her own Administrator, arts Status:  Mobility: Bed Mobility Overal bed mobility: Needs Assistance Bed Mobility: Supine to Sit, Sit to Supine Supine to sit: Supervision, HOB elevated Sit to supine: Supervision, HOB elevated General bed mobility comments: No assist needed, use of bed rails. No dizziness.  Transfers Overall transfer level: Needs assistance Equipment used: None Transfers: Sit to/from Stand Sit to Stand: Min guard General transfer comment: min guard for safety; stood from EOB x1, from toilet x1. + dizziness and reported tiredness getting back from bathroom. + pallor; BP 111/71 Ambulation/Gait Ambulation/Gait assistance: Min assist Gait Distance (Feet): 20 Feet(x3 bouts) Assistive device: None(counter in room) Gait Pattern/deviations: Step-through pattern, Decreased stride length, Drifts right/left General Gait Details: Slow, mildly unsteady gait esp with dual tasking and performing counting tasks. Not able to walk and problem solve- slow gait with LOB. 2 seated rest breaks due to dizziness, feeling tired. Bp 88/62, Sp02 94% with reports of chest discomfort. RN notified.  Gait velocity: decreased Gait velocity interpretation: <1.8 ft/sec, indicate of risk for recurrent  falls    ADL: ADL Overall ADL's : Needs assistance/impaired Eating/Feeding: Minimal assistance Eating/Feeding Details (indicate cue type and reason): a to locate food Grooming: Minimal assistance, Standing Grooming Details (indicate cue type and reason): sequencing adn locating itmes Upper Body Bathing: Minimal assistance, Sitting Lower Body Bathing: Sit to/from stand, Minimal assistance Upper Body Dressing : Moderate assistance, Sitting Lower Body Dressing: Minimal assistance, Sit to/from stand Toilet Transfer: Minimal assistance, Ambulation Toileting- Clothing Manipulation and Hygiene: Minimal assistance, Sit to/from stand Functional mobility during ADLs: Min guard General ADL Comments: Min guard during functional mobility, intermittent HHA  Cognition: Cognition Overall Cognitive Status: Impaired/Different from baseline Arousal/Alertness: Awake/alert Orientation Level: Oriented X4 Attention: Selective Selective Attention: Impaired Selective Attention Impairment: Verbal complex, Functional complex Memory: Impaired Memory Impairment: Decreased recall of new information, Decreased short term memory Decreased Short Term Memory: Verbal basic, Functional basic Awareness: Impaired Awareness Impairment: Emergent impairment Problem Solving: Impaired Problem Solving Impairment: Verbal complex, Functional complex Safety/Judgment: Impaired Cognition Arousal/Alertness: Awake/alert Behavior During Therapy: WFL for tasks assessed/performed Overall Cognitive Status: Impaired/Different from baseline Area of Impairment: Awareness, Problem solving Orientation Level: Disoriented to, Time Current Attention Level: Selective Memory: Decreased short-term memory Safety/Judgement: Decreased awareness of deficits, Decreased awareness of safety Awareness: Emergent Problem Solving: Slow processing, Requires verbal cues General Comments: Impaired memory, able to recall 2/3 words for short term  recall by using word associations. Not able to dual task- subtract 5 or count down numbers during functional mobility. See cognitive TUG results  Physical Exam: Blood pressure (!) 107/53, pulse 62, temperature 98.2 F (36.8 C), temperature source Oral, resp. rate 17, height '5\' 5"'$  (1.651 m), weight 80.4 kg, SpO2 96 %. Physical Exam  Constitutional: She appears well-developed and well-nourished.  HENT:  Head: Normocephalic and atraumatic.  Eyes: EOM are normal. Right eye exhibits no discharge. Left eye exhibits no discharge.  Neck: Normal range of motion. Neck supple.  Cardiovascular: Normal rate and regular rhythm.  Respiratory: Effort normal and breath sounds normal.  GI: Soft. Bowel sounds are normal.  Musculoskeletal:     Comments: No  edema or tenderness in extremities  Neurological: She is alert.  Follows commands and makes good eye contact with examiner.  Some delay in processing but she was able to provide her name, place, age and date of birth. She needed several cueing for the appropriate year. Motor: Right upper extremity/right lower extremity: 5/5 proximal distal Left upper extremity: 4-4+/5 proximal distal Left lower extremity: 4+-5/5 proximal distal Sensation intact light touch  Skin: Skin is warm and dry.  Psychiatric: She has a normal mood and affect. Her behavior is normal. Thought content normal.    Results for orders placed or performed during the hospital encounter of 03/03/18 (from the past 48 hour(s))  Magnesium     Status: None   Collection Time: 03/10/18  6:08 AM  Result Value Ref Range   Magnesium 2.2 1.7 - 2.4 mg/dL    Comment: Performed at [facility], [place] [address] [place], [place] [address]  Troponin I - Now Then Q6H     Status: Abnormal   Collection Time: 03/10/18 11:23 AM  Result Value Ref Range   Troponin I 2.06 (HH) <0.03 ng/mL    Comment: CRITICAL RESULT CALLED TO, READ BACK BY AND VERIFIED WITH: J.RENNER,RN 03/10/2018 1229  DAVISB Performed at [facility], [place] [address], [place], [place] [address]   Prepare RBC     Status: None   Collection Time: 03/10/18  1:35 PM  Result Value Ref Range   Order Confirmation      ORDER PROCESSED BY BLOOD BANK Performed at [facility], [place] [address], [place], [place] [address]   Type and screen [place]     Status: None (Preliminary result)   Collection Time: 03/10/18  2:52 PM  Result Value Ref Range   ABO/RH(D) O POS    Antibody Screen NEG    Sample Expiration 03/13/2018    Unit Number [id number]    Blood Component Type RBC, LR IRR    Unit division 00    Status of Unit ISSUED    Transfusion Status OK TO TRANSFUSE    Crossmatch Result      Compatible Performed at [facility], [place] [address], [place], [place] [address]   Troponin I - Now Then Q6H     Status: Abnormal   Collection Time: 03/10/18  5:35 PM  Result Value Ref Range   Troponin I 2.43 (HH) <0.03 ng/mL    Comment: CRITICAL VALUE NOTED.  VALUE IS CONSISTENT WITH PREVIOUSLY REPORTED AND CALLED VALUE. Performed at [facility], [place] [address] [place], [place] [address]   Troponin I - Now Then Q6H     Status: Abnormal   Collection Time: 03/10/18 11:53 PM  Result Value Ref Range   Troponin I 2.15 (HH) <0.03 ng/mL    Comment: CRITICAL VALUE NOTED.  VALUE IS CONSISTENT WITH PREVIOUSLY REPORTED AND CALLED VALUE. Performed at [facility], [place] [address] [place], [place] [address]    *Note: Due to a large number of results and/or encounters for the requested time period, some results have not been displayed. A complete set of results can be found in Results Review.   Ir Ivc Filter Plmt / S&i /img Guid/mod Sed  Result Date: 03/09/2018 INDICATION: Paradoxical embolus.  Calf vein DVT.  Pulmonary embolism. EXAM: IVC FILTER,INFERIOR VENA CAVOGRAM MEDICATIONS: None. ANESTHESIA/SEDATION: Fentanyl 75 mcg IV; Versed 1 mg IV Moderate Sedation Time:  17 minutes  The patient was continuously monitored  during the procedure by the interventional radiology nurse under my direct supervision. FLUOROSCOPY TIME:  Fluoroscopy Time: 3 minutes 30 seconds (31 mGy). COMPLICATIONS: None immediate. PROCEDURE: Informed written consent was obtained from the patient after a thorough discussion of the procedural risks, benefits and alternatives. All questions were addressed. Maximal Sterile Barrier Technique was utilized including caps, mask, sterile gowns, sterile gloves, sterile drape, hand hygiene and skin antiseptic. A timeout was performed prior to the initiation of the procedure. The right neck was prepped with Betadine in a sterile fashion, and a sterile drape was applied covering the operative field. A sterile gown and sterile gloves were used for the procedure. The right jugular vein was noted to be patent initially with ultrasound. Under sonographic guidance, a micropuncture needle was inserted into the right jugular vein (Ultrasound image documentation was performed). It was removed over an 018 wire which was up-sized to a [place]. The sheath was inserted over the wire and into the IVC. IVC carbon dioxide venography was performed. The temporary [place] filter was then deployed in the infrarenal IVC. The sheath was removed and hemostasis was achieved with direct pressure. FINDINGS: IVC venography demonstrates renal inflow at the mid L2 level. There is no venous anomaly. There is no evidence of mega cava. Findings were corroborated with the accompanying CT abdomen. The final image demonstrates a deployed IVC filter with its tip at the lower L2 endplate. IMPRESSION: Successful infrarenal IVC filter placement. This is a temporary filter. It can be removed or remain in place to become permanent. PLAN: This IVC filter is potentially retrievable. The patient will be assessed for filter retrieval by Interventional Radiology in approximately 8-12 weeks. Further recommendations regarding  filter retrieval, continued surveillance or declaration of device permanence, will be made at that time. Electronically Signed   By: Marybelle Killings M.D.   On: 03/09/2018 12:15       Medical Problem List and Plan: 1.  Confusion/AMS with gait disorder secondary to bilateral cortical and subcortical infarcts and various territories felt to be embolic  Admit to CIR 2.  Antithrombotics: -DVT/anticoagulation:  Acute DVT left posterior tibial vein, left peroneal vein per vascular study. Status post IVC filter 03/09/2018 per interventional radiology  -antiplatelet therapy: aspirin 325 mg daily 3. Pain Management: Neurontin 600 mg daily at bedtime and 300 mg daily as needed, tramadol as needed 4. Mood: Wellbutrin 75 mg twice a day, Celexa 40 mg daily. Provide emotional support  -antipsychotic agents: see above 5. Neuropsych: This patient is not fully capable of making decisions on her own behalf. 6. Skin/Wound Care:  Routine skin checks 7. Fluids/Electrolytes/Nutrition:  Routine and out's with follow-up chemistries in the a.m. 8. History of multiple myeloma with pancytopenia. Followed by Dr. Earlie Server 9. Findings a large fungating ulcerated proximal gastric mass suspicious for malignancy as well as GI bleed. Pathology report pending. Follow-up oncology services on plan of care 10. NSTEMI. Follow-up cardiology services.presently with no chest pain 11.Postural hypotension. Continue ProAmatine 10 mg daily 12.CKD stage IV.avoid nephrotoxins. Avoid contrast.history of AV fistula placement 2015 Followed by nephrology as outpatient. 13. Dysphagia. Esophagram results noted. Follow-up gastroenterology. Speech therapy to follow.  Currently on soft diet 14. Hypothyroidism. Synthroid 15. Hyperlipidemia. Lipitor  Post Admission Physician Evaluation: 1. Preadmission assessment reviewed and changes made below. 2. Functional deficits secondary  to bilateral cortical and subcortical infarcts and various territories  felt to be embolic. 3. Patient is admitted to receive collaborative, interdisciplinary care between the physiatrist, rehab nursing staff, and therapy team.  4. Patient's level of medical complexity and substantial therapy needs in context of that medical necessity cannot be provided at a lesser intensity of care such as a SNF. 5. Patient has experienced substantial functional loss from his/her baseline which was documented above under the "Functional History" and "Functional Status" headings.  Judging by the patient's diagnosis, physical exam, and functional history, the patient has potential for functional progress which will result in measurable gains while on inpatient rehab.  These gains will be of substantial and practical use upon discharge  in facilitating mobility and self-care at the household level. 6. Physiatrist will provide 24 hour management of medical needs as well as oversight of the therapy plan/treatment and provide guidance as appropriate regarding the interaction of the two. 7. 24 hour rehab nursing will assist with safety, disease management, medication administration and patient education  and help integrate therapy concepts, techniques,education, etc. 8. PT will assess and treat for/with: Lower extremity strength, range of motion, stamina, balance, functional mobility, safety, adaptive techniques and equipment, coping skills, pain control, education. Goals are: Mod I. 9. OT will assess and treat for/with: ADL's, functional mobility, safety, upper extremity strength, adaptive techniques and equipment, ego support, and community reintegration.   Goals are: Mod I. Therapy may proceed with showering this patient. 10. SLP will assess and treat for/with: cognition.  Goals are: Mod I/Supervision. 11. Case Management and Social Worker will assess and treat for psychological issues and discharge planning. 12. Team conference will be held weekly to assess progress toward goals and to determine  barriers to discharge. 13. Patient will receive at least 3 hours of therapy per day at least 5 days per week. 14. ELOS: 7-10 days.       15. Prognosis:  good  I have personally performed a face to face diagnostic evaluation, including, but not limited to relevant history and physical exam findings, of this patient and developed relevant assessment and plan.  Additionally, I have reviewed and concur with the physician assistant's documentation above.  Delice Lesch, MD, ABPMR Lavon Paganini Angiulli, PA-C 03/11/2018

## 2018-03-11 NOTE — Anesthesia Postprocedure Evaluation (Signed)
Anesthesia Post Note  Patient: Nancy Norris  Procedure(s) Performed: ESOPHAGOGASTRODUODENOSCOPY (EGD) WITH PROPOFOL (N/A ) BIOPSY     Patient location during evaluation: Endoscopy Anesthesia Type: MAC Level of consciousness: awake and alert Pain management: pain level controlled Vital Signs Assessment: post-procedure vital signs reviewed and stable Respiratory status: spontaneous breathing, nonlabored ventilation, respiratory function stable and patient connected to nasal cannula oxygen Cardiovascular status: stable and blood pressure returned to baseline Postop Assessment: no apparent nausea or vomiting Anesthetic complications: no    Last Vitals:  Vitals:   03/11/18 0833 03/11/18 1154  BP: 109/77 111/75  Pulse: 75 75  Resp: 16 16  Temp: 37.1 C 37.1 C  SpO2:  96%    Last Pain:  Vitals:   03/11/18 1154  TempSrc: Oral  PainSc:                  Karem Farha

## 2018-03-12 ENCOUNTER — Inpatient Hospital Stay (HOSPITAL_COMMUNITY)
Admission: EM | Admit: 2018-03-12 | Discharge: 2018-03-15 | DRG: 024 | Disposition: A | Discharge status: Rehabilitation Facility | Payer: Medicare Other | Source: Intra-hospital | Attending: Neurology | Admitting: Neurology

## 2018-03-12 ENCOUNTER — Inpatient Hospital Stay (HOSPITAL_COMMUNITY): Payer: Medicare Other

## 2018-03-12 ENCOUNTER — Inpatient Hospital Stay (HOSPITAL_COMMUNITY): Payer: Medicare Other | Admitting: Occupational Therapy

## 2018-03-12 ENCOUNTER — Inpatient Hospital Stay: Admit: 2018-03-12 | Payer: Medicare Other | Admitting: Interventional Radiology

## 2018-03-12 ENCOUNTER — Encounter (HOSPITAL_COMMUNITY): Payer: Self-pay | Admitting: Critical Care Medicine

## 2018-03-12 ENCOUNTER — Other Ambulatory Visit (HOSPITAL_COMMUNITY): Payer: Self-pay | Admitting: Neurology

## 2018-03-12 ENCOUNTER — Inpatient Hospital Stay: Payer: Medicare Other

## 2018-03-12 ENCOUNTER — Inpatient Hospital Stay (HOSPITAL_COMMUNITY)
Admission: RE | Admit: 2018-03-12 | Payer: Medicare Other | Source: Home / Self Care | Admitting: Interventional Radiology

## 2018-03-12 ENCOUNTER — Ambulatory Visit: Admit: 2018-03-12 | Payer: Medicare Other | Admitting: Interventional Radiology

## 2018-03-12 ENCOUNTER — Encounter (HOSPITAL_COMMUNITY): Admission: RE | Payer: Self-pay | Source: Home / Self Care

## 2018-03-12 ENCOUNTER — Inpatient Hospital Stay (HOSPITAL_COMMUNITY): Payer: Medicare Other | Admitting: Speech Pathology

## 2018-03-12 ENCOUNTER — Inpatient Hospital Stay (HOSPITAL_COMMUNITY): Payer: Medicare Other | Admitting: Physical Therapy

## 2018-03-12 DIAGNOSIS — Z79890 Hormone replacement therapy (postmenopausal): Secondary | ICD-10-CM | POA: Diagnosis not present

## 2018-03-12 DIAGNOSIS — D649 Anemia, unspecified: Secondary | ICD-10-CM | POA: Diagnosis not present

## 2018-03-12 DIAGNOSIS — C169 Malignant neoplasm of stomach, unspecified: Secondary | ICD-10-CM | POA: Diagnosis present

## 2018-03-12 DIAGNOSIS — I951 Orthostatic hypotension: Secondary | ICD-10-CM | POA: Diagnosis not present

## 2018-03-12 DIAGNOSIS — I639 Cerebral infarction, unspecified: Secondary | ICD-10-CM

## 2018-03-12 DIAGNOSIS — J449 Chronic obstructive pulmonary disease, unspecified: Secondary | ICD-10-CM | POA: Diagnosis present

## 2018-03-12 DIAGNOSIS — Z8673 Personal history of transient ischemic attack (TIA), and cerebral infarction without residual deficits: Secondary | ICD-10-CM

## 2018-03-12 DIAGNOSIS — Z7189 Other specified counseling: Secondary | ICD-10-CM | POA: Diagnosis not present

## 2018-03-12 DIAGNOSIS — I82442 Acute embolism and thrombosis of left tibial vein: Secondary | ICD-10-CM | POA: Diagnosis present

## 2018-03-12 DIAGNOSIS — I63412 Cerebral infarction due to embolism of left middle cerebral artery: Secondary | ICD-10-CM | POA: Diagnosis not present

## 2018-03-12 DIAGNOSIS — I214 Non-ST elevation (NSTEMI) myocardial infarction: Secondary | ICD-10-CM | POA: Diagnosis not present

## 2018-03-12 DIAGNOSIS — D6859 Other primary thrombophilia: Secondary | ICD-10-CM

## 2018-03-12 DIAGNOSIS — C9001 Multiple myeloma in remission: Secondary | ICD-10-CM | POA: Diagnosis not present

## 2018-03-12 DIAGNOSIS — J4520 Mild intermittent asthma, uncomplicated: Secondary | ICD-10-CM

## 2018-03-12 DIAGNOSIS — K3189 Other diseases of stomach and duodenum: Secondary | ICD-10-CM | POA: Diagnosis not present

## 2018-03-12 DIAGNOSIS — R4701 Aphasia: Secondary | ICD-10-CM | POA: Diagnosis present

## 2018-03-12 DIAGNOSIS — I69320 Aphasia following cerebral infarction: Secondary | ICD-10-CM | POA: Diagnosis not present

## 2018-03-12 DIAGNOSIS — E782 Mixed hyperlipidemia: Secondary | ICD-10-CM | POA: Diagnosis present

## 2018-03-12 DIAGNOSIS — R1319 Other dysphagia: Secondary | ICD-10-CM

## 2018-03-12 DIAGNOSIS — C9000 Multiple myeloma not having achieved remission: Secondary | ICD-10-CM | POA: Diagnosis not present

## 2018-03-12 DIAGNOSIS — C801 Malignant (primary) neoplasm, unspecified: Secondary | ICD-10-CM | POA: Diagnosis not present

## 2018-03-12 DIAGNOSIS — G8191 Hemiplegia, unspecified affecting right dominant side: Secondary | ICD-10-CM | POA: Diagnosis present

## 2018-03-12 DIAGNOSIS — I63512 Cerebral infarction due to unspecified occlusion or stenosis of left middle cerebral artery: Secondary | ICD-10-CM | POA: Diagnosis present

## 2018-03-12 DIAGNOSIS — I10 Essential (primary) hypertension: Secondary | ICD-10-CM | POA: Diagnosis not present

## 2018-03-12 DIAGNOSIS — M797 Fibromyalgia: Secondary | ICD-10-CM | POA: Diagnosis present

## 2018-03-12 DIAGNOSIS — F419 Anxiety disorder, unspecified: Secondary | ICD-10-CM | POA: Diagnosis present

## 2018-03-12 DIAGNOSIS — E46 Unspecified protein-calorie malnutrition: Secondary | ICD-10-CM

## 2018-03-12 DIAGNOSIS — R29721 NIHSS score 21: Secondary | ICD-10-CM | POA: Diagnosis present

## 2018-03-12 DIAGNOSIS — E039 Hypothyroidism, unspecified: Secondary | ICD-10-CM | POA: Diagnosis present

## 2018-03-12 DIAGNOSIS — D61818 Other pancytopenia: Secondary | ICD-10-CM | POA: Diagnosis present

## 2018-03-12 DIAGNOSIS — I252 Old myocardial infarction: Secondary | ICD-10-CM | POA: Diagnosis not present

## 2018-03-12 DIAGNOSIS — R29810 Facial weakness: Secondary | ICD-10-CM | POA: Diagnosis present

## 2018-03-12 DIAGNOSIS — R197 Diarrhea, unspecified: Secondary | ICD-10-CM

## 2018-03-12 DIAGNOSIS — I69398 Other sequelae of cerebral infarction: Secondary | ICD-10-CM

## 2018-03-12 DIAGNOSIS — I82452 Acute embolism and thrombosis of left peroneal vein: Secondary | ICD-10-CM | POA: Diagnosis not present

## 2018-03-12 DIAGNOSIS — F339 Major depressive disorder, recurrent, unspecified: Secondary | ICD-10-CM | POA: Diagnosis not present

## 2018-03-12 DIAGNOSIS — R0789 Other chest pain: Secondary | ICD-10-CM | POA: Diagnosis not present

## 2018-03-12 DIAGNOSIS — R2689 Other abnormalities of gait and mobility: Secondary | ICD-10-CM | POA: Diagnosis not present

## 2018-03-12 DIAGNOSIS — E785 Hyperlipidemia, unspecified: Secondary | ICD-10-CM

## 2018-03-12 DIAGNOSIS — Z66 Do not resuscitate: Secondary | ICD-10-CM | POA: Diagnosis not present

## 2018-03-12 DIAGNOSIS — Z87891 Personal history of nicotine dependence: Secondary | ICD-10-CM

## 2018-03-12 DIAGNOSIS — Z7982 Long term (current) use of aspirin: Secondary | ICD-10-CM

## 2018-03-12 DIAGNOSIS — D6869 Other thrombophilia: Secondary | ICD-10-CM

## 2018-03-12 DIAGNOSIS — I82409 Acute embolism and thrombosis of unspecified deep veins of unspecified lower extremity: Secondary | ICD-10-CM | POA: Diagnosis present

## 2018-03-12 DIAGNOSIS — Z9049 Acquired absence of other specified parts of digestive tract: Secondary | ICD-10-CM | POA: Diagnosis not present

## 2018-03-12 DIAGNOSIS — R110 Nausea: Secondary | ICD-10-CM

## 2018-03-12 DIAGNOSIS — Z90710 Acquired absence of both cervix and uterus: Secondary | ICD-10-CM

## 2018-03-12 DIAGNOSIS — F331 Major depressive disorder, recurrent, moderate: Secondary | ICD-10-CM | POA: Diagnosis not present

## 2018-03-12 DIAGNOSIS — I69351 Hemiplegia and hemiparesis following cerebral infarction affecting right dominant side: Secondary | ICD-10-CM | POA: Diagnosis not present

## 2018-03-12 DIAGNOSIS — Z95828 Presence of other vascular implants and grafts: Secondary | ICD-10-CM

## 2018-03-12 DIAGNOSIS — Z9221 Personal history of antineoplastic chemotherapy: Secondary | ICD-10-CM

## 2018-03-12 DIAGNOSIS — N184 Chronic kidney disease, stage 4 (severe): Secondary | ICD-10-CM | POA: Diagnosis present

## 2018-03-12 DIAGNOSIS — K219 Gastro-esophageal reflux disease without esophagitis: Secondary | ICD-10-CM | POA: Diagnosis present

## 2018-03-12 DIAGNOSIS — Z853 Personal history of malignant neoplasm of breast: Secondary | ICD-10-CM | POA: Diagnosis not present

## 2018-03-12 DIAGNOSIS — D631 Anemia in chronic kidney disease: Secondary | ICD-10-CM | POA: Diagnosis present

## 2018-03-12 DIAGNOSIS — D696 Thrombocytopenia, unspecified: Secondary | ICD-10-CM | POA: Diagnosis present

## 2018-03-12 DIAGNOSIS — Z9012 Acquired absence of left breast and nipple: Secondary | ICD-10-CM | POA: Diagnosis not present

## 2018-03-12 DIAGNOSIS — Q211 Atrial septal defect: Secondary | ICD-10-CM

## 2018-03-12 DIAGNOSIS — R269 Unspecified abnormalities of gait and mobility: Secondary | ICD-10-CM | POA: Diagnosis not present

## 2018-03-12 DIAGNOSIS — I6340 Cerebral infarction due to embolism of unspecified cerebral artery: Secondary | ICD-10-CM | POA: Diagnosis not present

## 2018-03-12 DIAGNOSIS — I82451 Acute embolism and thrombosis of right peroneal vein: Secondary | ICD-10-CM

## 2018-03-12 DIAGNOSIS — Z9484 Stem cells transplant status: Secondary | ICD-10-CM | POA: Diagnosis not present

## 2018-03-12 DIAGNOSIS — I6602 Occlusion and stenosis of left middle cerebral artery: Secondary | ICD-10-CM | POA: Diagnosis not present

## 2018-03-12 DIAGNOSIS — R4182 Altered mental status, unspecified: Secondary | ICD-10-CM | POA: Diagnosis present

## 2018-03-12 DIAGNOSIS — Z86718 Personal history of other venous thrombosis and embolism: Secondary | ICD-10-CM

## 2018-03-12 DIAGNOSIS — F329 Major depressive disorder, single episode, unspecified: Secondary | ICD-10-CM | POA: Diagnosis present

## 2018-03-12 DIAGNOSIS — K5900 Constipation, unspecified: Secondary | ICD-10-CM

## 2018-03-12 DIAGNOSIS — I129 Hypertensive chronic kidney disease with stage 1 through stage 4 chronic kidney disease, or unspecified chronic kidney disease: Secondary | ICD-10-CM | POA: Diagnosis present

## 2018-03-12 DIAGNOSIS — I2510 Atherosclerotic heart disease of native coronary artery without angina pectoris: Secondary | ICD-10-CM | POA: Diagnosis present

## 2018-03-12 DIAGNOSIS — E538 Deficiency of other specified B group vitamins: Secondary | ICD-10-CM

## 2018-03-12 DIAGNOSIS — Z515 Encounter for palliative care: Secondary | ICD-10-CM | POA: Diagnosis not present

## 2018-03-12 DIAGNOSIS — R52 Pain, unspecified: Secondary | ICD-10-CM

## 2018-03-12 DIAGNOSIS — Z79899 Other long term (current) drug therapy: Secondary | ICD-10-CM

## 2018-03-12 DIAGNOSIS — J439 Emphysema, unspecified: Secondary | ICD-10-CM

## 2018-03-12 DIAGNOSIS — I959 Hypotension, unspecified: Secondary | ICD-10-CM | POA: Diagnosis present

## 2018-03-12 HISTORY — PX: IR CT HEAD LTD: IMG2386

## 2018-03-12 HISTORY — PX: IR PERCUTANEOUS ART THROMBECTOMY/INFUSION INTRACRANIAL INC DIAG ANGIO: IMG6087

## 2018-03-12 LAB — COMPREHENSIVE METABOLIC PANEL
ALT: 10 U/L (ref 0–44)
AST: 19 U/L (ref 15–41)
Albumin: 3.2 g/dL — ABNORMAL LOW (ref 3.5–5.0)
Alkaline Phosphatase: 76 U/L (ref 38–126)
Anion gap: 11 (ref 5–15)
BUN: 13 mg/dL (ref 8–23)
CHLORIDE: 101 mmol/L (ref 98–111)
CO2: 26 mmol/L (ref 22–32)
Calcium: 9.2 mg/dL (ref 8.9–10.3)
Creatinine, Ser: 2.39 mg/dL — ABNORMAL HIGH (ref 0.44–1.00)
GFR calc Af Amer: 22 mL/min — ABNORMAL LOW (ref >60)
GFR calc non Af Amer: 19 mL/min — ABNORMAL LOW (ref >60)
Glucose, Bld: 112 mg/dL — ABNORMAL HIGH (ref 70–99)
Potassium: 3.9 mmol/L (ref 3.5–5.1)
Sodium: 138 mmol/L (ref 135–145)
Total Bilirubin: 0.7 mg/dL (ref 0.3–1.2)
Total Protein: 5.4 g/dL — ABNORMAL LOW (ref 6.5–8.1)

## 2018-03-12 LAB — CBC WITH DIFFERENTIAL/PLATELET
ABS IMMATURE GRANULOCYTES: 0.03 10*3/uL (ref 0.00–0.07)
Basophils Absolute: 0 10*3/uL (ref 0.0–0.1)
Basophils Relative: 1 %
Eosinophils Absolute: 0.2 10*3/uL (ref 0.0–0.5)
Eosinophils Relative: 4 %
HCT: 30.9 % — ABNORMAL LOW (ref 36.0–46.0)
Hemoglobin: 9.6 g/dL — ABNORMAL LOW (ref 12.0–15.0)
Immature Granulocytes: 1 %
Lymphocytes Relative: 29 %
Lymphs Abs: 1.4 10*3/uL (ref 0.7–4.0)
MCH: 32.1 pg (ref 26.0–34.0)
MCHC: 31.1 g/dL (ref 30.0–36.0)
MCV: 103.3 fL — ABNORMAL HIGH (ref 80.0–100.0)
MONO ABS: 0.7 10*3/uL (ref 0.1–1.0)
Monocytes Relative: 14 %
NEUTROS ABS: 2.5 10*3/uL (ref 1.7–7.7)
NRBC: 0 % (ref 0.0–0.2)
Neutrophils Relative %: 51 %
Platelets: 58 10*3/uL — ABNORMAL LOW (ref 150–400)
RBC: 2.99 MIL/uL — AB (ref 3.87–5.11)
RDW: 22.3 % — ABNORMAL HIGH (ref 11.5–15.5)
WBC: 4.7 10*3/uL (ref 4.0–10.5)

## 2018-03-12 LAB — GLUCOSE, CAPILLARY
Glucose-Capillary: 97 mg/dL (ref 70–99)
Glucose-Capillary: 128 mg/dL — ABNORMAL HIGH (ref 70–99)
Glucose-Capillary: 156 mg/dL — ABNORMAL HIGH (ref 70–99)

## 2018-03-12 LAB — MRSA PCR SCREENING: MRSA by PCR: NEGATIVE

## 2018-03-12 SURGERY — RADIOLOGY WITH ANESTHESIA
Anesthesia: Choice

## 2018-03-12 MED ORDER — CLEVIDIPINE BUTYRATE 0.5 MG/ML IV EMUL
0.0000 mg/h | INTRAVENOUS | Status: DC
  Administered 2018-03-12: 1 mg/h via INTRAVENOUS
  Filled 2018-03-12 (×2): qty 50

## 2018-03-12 MED ORDER — PROPOFOL 10 MG/ML IV BOLUS
INTRAVENOUS | Status: DC | PRN
  Administered 2018-03-12: 100 mg via INTRAVENOUS

## 2018-03-12 MED ORDER — METHYLPREDNISOLONE SODIUM SUCC 125 MG IJ SOLR: 125.0000 mg | INTRAMUSCULAR | Status: DC

## 2018-03-12 MED ORDER — FENTANYL CITRATE (PF) 100 MCG/2ML IJ SOLN
INTRAMUSCULAR | Status: DC | PRN
  Administered 2018-03-12: 100 ug via INTRAVENOUS

## 2018-03-12 MED ORDER — SODIUM CHLORIDE 0.9 % IV SOLN
INTRAVENOUS | Status: DC
  Administered 2018-03-12: 20:00:00 via INTRAVENOUS

## 2018-03-12 MED ORDER — SODIUM CHLORIDE 0.9 % IV SOLN
INTRAVENOUS | Status: DC
  Administered 2018-03-13 – 2018-03-15 (×4): via INTRAVENOUS

## 2018-03-12 MED ORDER — ASPIRIN 81 MG PO CHEW
CHEWABLE_TABLET | ORAL | Status: AC
  Filled 2018-03-12: qty 1

## 2018-03-12 MED ORDER — TIROFIBAN HCL IN NACL 5-0.9 MG/100ML-% IV SOLN
INTRAVENOUS | Status: AC
  Filled 2018-03-12: qty 100

## 2018-03-12 MED ORDER — IOPAMIDOL (ISOVUE-370) INJECTION 76%
50.0000 mL | Freq: Once | INTRAVENOUS | Status: AC | PRN
  Administered 2018-03-12: 50 mL via INTRAVENOUS

## 2018-03-12 MED ORDER — ACETAMINOPHEN 650 MG RE SUPP: 650.0000 mg | RECTAL | Status: DC | PRN

## 2018-03-12 MED ORDER — IOHEXOL 300 MG/ML  SOLN
40.0000 mL | Freq: Once | INTRAMUSCULAR | Status: AC | PRN
  Administered 2018-03-12: 40 mL via INTRA_ARTERIAL

## 2018-03-12 MED ORDER — CLOPIDOGREL BISULFATE 300 MG PO TABS
ORAL_TABLET | ORAL | Status: AC
  Filled 2018-03-12: qty 1

## 2018-03-12 MED ORDER — SODIUM CHLORIDE 0.9 % IV SOLN
INTRAVENOUS | Status: DC | PRN
  Administered 2018-03-12: 16:00:00 via INTRAVENOUS

## 2018-03-12 MED ORDER — ACETAMINOPHEN 325 MG PO TABS: 650.0000 mg | ORAL_TABLET | ORAL | Status: DC | PRN

## 2018-03-12 MED ORDER — SODIUM CHLORIDE (PF) 0.9 % IJ SOLN
INTRAVENOUS | Status: AC | PRN
  Administered 2018-03-12 (×2): 25 ug via INTRA_ARTERIAL

## 2018-03-12 MED ORDER — METHYLPREDNISOLONE SODIUM SUCC 125 MG IJ SOLR
125.0000 mg | INTRAMUSCULAR | Status: DC
  Administered 2018-03-12: 125 mg via INTRAVENOUS

## 2018-03-12 MED ORDER — NITROGLYCERIN 1 MG/10 ML FOR IR/CATH LAB
INTRA_ARTERIAL | Status: AC
  Filled 2018-03-12: qty 10

## 2018-03-12 MED ORDER — ACETAMINOPHEN 325 MG PO TABS
650.0000 mg | ORAL_TABLET | ORAL | Status: DC | PRN
  Administered 2018-03-14: 650 mg via ORAL
  Filled 2018-03-12: qty 2

## 2018-03-12 MED ORDER — CEFAZOLIN SODIUM-DEXTROSE 2-4 GM/100ML-% IV SOLN
INTRAVENOUS | Status: AC
  Filled 2018-03-12: qty 100

## 2018-03-12 MED ORDER — METHYLPREDNISOLONE SODIUM SUCC 125 MG IJ SOLR
INTRAMUSCULAR | Status: AC
  Filled 2018-03-12: qty 2

## 2018-03-12 MED ORDER — SODIUM CHLORIDE 0.9% IV SOLUTION: Freq: Once | INTRAVENOUS | Status: DC

## 2018-03-12 MED ORDER — PANTOPRAZOLE SODIUM 40 MG IV SOLR: 40.0000 mg | INTRAVENOUS | Status: DC

## 2018-03-12 MED ORDER — SODIUM CHLORIDE 0.9 % IV SOLN
INTRAVENOUS | Status: DC | PRN
  Administered 2018-03-12: 10 ug/min via INTRAVENOUS

## 2018-03-12 MED ORDER — LIDOCAINE 2% (20 MG/ML) 5 ML SYRINGE
INTRAMUSCULAR | Status: DC | PRN
  Administered 2018-03-12: 60 mg via INTRAVENOUS

## 2018-03-12 MED ORDER — SODIUM CHLORIDE 0.9 % IV SOLN
250.0000 mg | INTRAVENOUS | Status: DC
  Filled 2018-03-12: qty 2

## 2018-03-12 MED ORDER — ASPIRIN 325 MG PO TABS
ORAL_TABLET | ORAL | Status: AC
  Filled 2018-03-12: qty 1

## 2018-03-12 MED ORDER — ROCURONIUM BROMIDE 50 MG/5ML IV SOSY
PREFILLED_SYRINGE | INTRAVENOUS | Status: DC | PRN
  Administered 2018-03-12: 50 mg via INTRAVENOUS
  Administered 2018-03-12: 40 mg via INTRAVENOUS
  Administered 2018-03-12: 10 mg via INTRAVENOUS

## 2018-03-12 MED ORDER — SODIUM CHLORIDE 0.9% FLUSH
10.0000 mL | INTRAVENOUS | Status: DC | PRN
  Administered 2018-03-12: 10 mL
  Filled 2018-03-12: qty 40

## 2018-03-12 MED ORDER — SUCCINYLCHOLINE CHLORIDE 20 MG/ML IJ SOLN
INTRAMUSCULAR | Status: DC | PRN
  Administered 2018-03-12: 100 mg via INTRAVENOUS

## 2018-03-12 MED ORDER — FENTANYL CITRATE (PF) 100 MCG/2ML IJ SOLN
INTRAMUSCULAR | Status: AC
  Filled 2018-03-12: qty 2

## 2018-03-12 MED ORDER — GLYCOPYRROLATE 0.2 MG/ML IJ SOLN
INTRAMUSCULAR | Status: DC | PRN
  Administered 2018-03-12 (×2): 0.1 mg via INTRAVENOUS

## 2018-03-12 MED ORDER — METHYLPREDNISOLONE SODIUM SUCC 125 MG IJ SOLR
125.0000 mg | INTRAMUSCULAR | Status: AC
  Administered 2018-03-12: 125 mg via INTRAVENOUS

## 2018-03-12 MED ORDER — DIPHENHYDRAMINE HCL 50 MG/ML IJ SOLN
50.0000 mg | INTRAMUSCULAR | Status: AC
  Administered 2018-03-12: 50 mg via INTRAVENOUS
  Filled 2018-03-12: qty 1

## 2018-03-12 MED ORDER — DEXAMETHASONE SODIUM PHOSPHATE 10 MG/ML IJ SOLN
INTRAMUSCULAR | Status: DC | PRN
  Administered 2018-03-12: 4 mg via INTRAVENOUS

## 2018-03-12 MED ORDER — ACETAMINOPHEN 160 MG/5ML PO SOLN: 650.0000 mg | ORAL | Status: DC | PRN

## 2018-03-12 MED ORDER — LIDOCAINE HCL 1 % IJ SOLN
INTRAMUSCULAR | Status: AC
  Filled 2018-03-12: qty 20

## 2018-03-12 MED ORDER — STROKE: EARLY STAGES OF RECOVERY BOOK: Freq: Once | Status: DC

## 2018-03-12 MED ORDER — TICAGRELOR 90 MG PO TABS
ORAL_TABLET | ORAL | Status: AC
  Filled 2018-03-12: qty 2

## 2018-03-12 MED ORDER — EPTIFIBATIDE 20 MG/10ML IV SOLN
INTRAVENOUS | Status: AC
  Filled 2018-03-12: qty 10

## 2018-03-12 MED ORDER — EPHEDRINE SULFATE 50 MG/ML IJ SOLN
INTRAMUSCULAR | Status: DC | PRN
  Administered 2018-03-12: 10 mg via INTRAVENOUS

## 2018-03-12 MED ORDER — SUGAMMADEX SODIUM 200 MG/2ML IV SOLN
INTRAVENOUS | Status: DC | PRN
  Administered 2018-03-12: 400 mg via INTRAVENOUS

## 2018-03-12 MED ORDER — DIPHENHYDRAMINE HCL 50 MG/ML IJ SOLN
INTRAMUSCULAR | Status: AC
  Administered 2018-03-12: 50 mg via INTRAVENOUS
  Filled 2018-03-12: qty 1

## 2018-03-12 MED ORDER — CEFAZOLIN SODIUM-DEXTROSE 2-3 GM-%(50ML) IV SOLR
INTRAVENOUS | Status: DC | PRN
  Administered 2018-03-12: 2 g via INTRAVENOUS

## 2018-03-12 NOTE — Code Documentation (Signed)
78yo female admitted to inpatient rehab on 2/25 following hospital admission on 2/17 for left occipital and left frontal parietal infarcts. MRI showing multiple posterior and anterior bilateral infarcts. Patient with PMH of multiple myeloma, breast cancer, chronic anemia/thrombocytopenia, CKD and large fungating malignant appearing ulcerated proximal stomach mass. She had an IVC filter placed on 2/23 in IR following acute DVT in the left posterior tibial vein, not an anticoagulation candidate. Patient was reportedly doing well in inpatient rehab today moving all extremities without cognitive deficits and up to chair eating. She was LKW at 1350 per staff. Code stroke called d/t patient developing new onset right hemiparesis and aphasia. Stroke team to the bedside. Patient transported to CT by stroke team. NIHSS 21, see documentation for details and code stroke times. Patient with aphasia, right hemianopsia, left gaze preference and right hemiparesis on exam. CT completed. Patient without PIV. CT tech placed 22g difusics IV with 3 attempts. Patient with contrast allergy. Patient administered Benadryl 73m IVP and Solumedrol 2547mIVP per Dr. XuErlinda Hongsee MAR. Pharmacy contacted and instructed to push Solumedrol dose over 5 minutes, Solumedrol given as directed. O2 saturations maintained in the 90s post-Benadryl. CTA head and neck completed. Patient transported to IR holding bay. Vital signs assessed, see documentation. Decision made to proceed with endovascular intervention. Patient to IR room. Bedside handoff with IR and anesthesia teams. Patient to be discharged and readmitted. Contacted 4W staff, 4N staff and bed placement to facilitate readmission.

## 2018-03-12 NOTE — Progress Notes (Signed)
Social Work  Social Work Assessment and Plan  Patient Details  Name: Nancy Norris MRN: [id number] Date of Birth: 1940-05-15  Today's Date: 03/12/2018  Problem List:  Patient Active Problem List   Diagnosis Date Noted  . Subcortical infarction ([place]) 03/11/2018  . Pancytopenia ([place])   . NSTEMI (non-ST elevated myocardial infarction) ([place])   . Dyslipidemia   . Hypotension   . Acute DVT (deep venous thrombosis) ([place])   . Malignant neoplasm of stomach ([place])   . Acute cardioembolic stroke ([place])   . CKD (chronic kidney disease), stage III ([place])   . Dysphagia   . CKD (chronic kidney disease), stage IV ([place])   . Stroke ([place]) 03/03/2018  . Port catheter in place 09/18/2016  . 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 Symptomatic anemia 03/31/2015  . B12 deficiency 12/04/2014  . Thrombocytopenia ([place]) 12/04/2014  . Encounter for antineoplastic chemotherapy 06/21/2014  . CKD (chronic kidney disease) stage 5, GFR less than 15 ml/min (HCC) 04/02/2014  . Multiple myeloma ([place]) 12/02/2013  . Major depressive disorder, recurrent episode ([place]) 11/02/2013  . Medication management 09/30/2013  . Vitamin D deficiency 03/24/2013  . Mixed hyperlipidemia 02/24/2013  . COPD (chronic obstructive pulmonary disease) with emphysema ([place]) 02/20/2013  . GERD (gastroesophageal reflux disease) 01/20/2013  . HTN (hypertension) 01/14/2013  . Asthma, chronic 01/13/2013  . Hypothyroid 01/13/2013  . Chronic diastolic heart failure ([place]) 01/13/2013  . HX: breast cancer 12/08/2012  . Hypercalcemia 12/02/2012   Past Medical History:  Past Medical History:  Diagnosis Date  . Anemia   . Anginal pain ([place])    used NTG x 2 May 31 and 06/15/13   . Anxiety   . Arthritis   . B12 deficiency 12/04/2014  . Breast cancer ([place])   . Complication of anesthesia   . COPD (chronic obstructive pulmonary disease) ([place])   . 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 Depression   . Dizziness   . Dyspnea   .  Fibromyalgia   . Fibromyalgia   . GERD (gastroesophageal reflux disease)   . Headache(784.0)   . Heart murmur   . Hemoptysis 10/21/2015  . History of blood transfusion    last one May 12   . Hx of cardiovascular stress test    LexiScan with low level exercise Myoview (02/2013): No ischemia, EF 72%; normal study  . Hx of echocardiogram    a.  Echocardiogram (12/26/2012): EF 92-42%, grade 1 diastolic dysfunction;   b.  Echocardiogram (02/2013): EF 55-60%, no WMA, trivial effusion  . Hyperkalemia   . Hyponatremia   . Hypotension   . Hypothyroidism   . Mucositis   . Multiple myeloma   . Myocardial infarction [facility])    in past, patient was unaware.   . Neuropathy   . Nodule of left lung 09/13/2015  . Pain in joint, pelvic region and thigh 07/07/2015  . Pneumonia    several  . PONV (postoperative nausea and vomiting) 2008   after mastestomy   Past Surgical History:  Past Surgical History:  Procedure Laterality Date  . ABDOMINAL HYSTERECTOMY  1981  . AV FISTULA PLACEMENT Left 06/19/2013   Procedure: CREATION OF LEFT ARM ARTERIOVENOUS (AV) FISTULA ;  Surgeon: Angelia Mould, MD;  Location: [facility];  Service: Vascular;  Laterality: Left;  . BIOPSY  03/08/2018   Procedure: BIOPSY;  Surgeon: Wilford Corner, MD;  Location: [place];  Service: Endoscopy;;  . BREAST RECONSTRUCTION    . BREAST SURGERY Right    reduction  .  CATARACT EXTRACTION, BILATERAL    . CHOLECYSTECTOMY  1971  . COLONOSCOPY    . ESOPHAGOGASTRODUODENOSCOPY (EGD) WITH PROPOFOL N/A 03/08/2018   Procedure: ESOPHAGOGASTRODUODENOSCOPY (EGD) WITH PROPOFOL;  Surgeon: Wilford Corner, MD;  Location: [facility];  Service: Endoscopy;  Laterality: N/A;  . EYE SURGERY Bilateral    lens implant  . history of Port removal    . IR IVC FILTER PLMT / S&I /IMG GUID/MOD SED  03/09/2018  . LUNG BIOPSY  10/21/2015  . MASTECTOMY Left 2008  . PORTACATH PLACEMENT  12/2012   has had 2  . RESECTION OF ARTERIOVENOUS FISTULA  ANEURYSM Left 06/04/2017   Procedure: LIGATION ANEURYSM OF LEFT ARTERIOVENOUS FISTULA;  Surgeon: Angelia Mould, MD;  Location: [facility];  Service: Vascular;  Laterality: Left;  . Status post stem cell transplant on September 28, 2008.    . TEE WITHOUT CARDIOVERSION N/A 03/07/2018   Procedure: TRANSESOPHAGEAL ECHOCARDIOGRAM (TEE);  Surgeon: Buford Dresser, MD;  [facility]COPY;  Service: Cardiovascular;  Laterality: N/A;   Social History:  reports that she quit smoking about 13 years ago. Her smoking use included cigarettes. She has a 30.00 pack-year smoking history. She has never used smokeless tobacco. She reports that she does not drink alcohol or use drugs.  Family / Support Systems Marital Status: Single Patient Roles: Other (Comment), Parent Children: [name] [phone] Other Supports: [name] [phone]-cell Anticipated Caregiver: Self Ability/Limitations of Caregiver: [name] works and [name] is a Quarry manager but can come by and check on her along with her daughter coming in the evenings Caregiver Availability: Intermittent Family Dynamics: Close with her daughter who is local and works for [employer]. She has friends who are supportive and a close friend-Sheila who will check on her and is never far away.   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   Abuse/Neglect Abuse/Neglect Assessment Can Be Completed: Yes Physical Abuse: Denies Verbal Abuse: Denies Sexual Abuse: Denies Exploitation of patient/patient's resources: Denies Self-Neglect: Denies  Emotional Status [name] affect, behavior and adjustment status: Pt is motivated  to do well here and feels she is recovering, her mian issues are high level, spelling and how to work her phone, which is new. She takes more time due to she fatigues more easily and has been like this with her cancern diagnosis.  She has always been one who care for herself and wants to be able to do this at discharge from ehre. Recent Psychosocial Issues: cancer treatments and follow up appointments Psychiatric History: History of anxiety takes medications for this and finds it helpful. She would benefit from seeing neuro-psych while here if here long enough. She is doing well and recovering from her stroke. Substance Abuse History: No issues  Patient / Family Perceptions, Expectations & Goals Pt/Family understanding of illness & functional limitations: Pt does talk with her many MD's and feels she has a good understanding of her condition and treatment going forward. SHe knows she will have a OP appointment with Dr. Rogue Jury to discuss options for treamtent with this new mass found. Premorbid pt/family roles/activities: Mom, friend, retiree, church member, etc Anticipated changes in roles/activities/participation: resume Pt/family expectations/goals: Pt states: " I want to be able to take care of myself like I was doing  before this."  Daughter states: " I hope she does well but she pushes herself hard and over does it at times."  [employer]: Other (Comment)(Cancer Center pt) Premorbid Home Care/DME Agencies: Other (Comment)(has rw, tub seat, rollator) Transportation available at discharge: Daughter, SCAT and cancer center trasnportation Resource referrals recommended: Neuropsychology, Support group (specify)  Discharge Planning Living Arrangements: Alone Support Systems: Children, Friends/neighbors, Church/faith community Type of Residence: Private residence Insurance Resources: [employer], Multimedia programmer (specify)(BCBS) Financial Screen Referred: No Living  Expenses: Rent Money Management: Patient Does the patient have any problems obtaining your medications?: No Home Management: Self Patient/Family Preliminary Plans: Return to her apartment and have her daughter and friend check on her intermittently. She is high level and moving well she does have some high level thought processing and using her iphone difficulties, which is new. Sw Barriers to Discharge: Decreased caregiver support, Medical stability Sw Barriers to Discharge Comments: Does not ahve 24 hr care Social Work Anticipated Follow Up Needs: HH/OP, Support Group  Clinical Impression Very pleasant female who is motivated and at times pushes herself too hard. Her daughter and friend are involved but can not provide 24 hr care. So she will need to be mod/i to be able to return home alone safely. Will see if here long enough to see neuro-psych she has a lot going on with her health, would benefit from seeing while here.  [name] 03/12/2018, 12:52 PM

## 2018-03-12 NOTE — Patient Care Conference (Signed)
Inpatient RehabilitationTeam Conference and Plan of Care Update Date: 03/12/2018   Time: 9:48 AM    Patient Name: Nancy Norris      Medical Record Number: [id number]  Date of Birth: 05-21-1940 Sex: Female         Room/Bed: 4W25C/4W25C-01 Payor Info: Payor: MEDICARE / Plan: MEDICARE PART A AND B / Product Type: *No Product type* /    Admitting Diagnosis: CVA  Admit Date/Time:  03/11/2018 12:23 PM Admission Comments: No comment available   Primary Diagnosis:  <principal problem not specified> Principal Problem: <principal problem not specified>  Patient Active Problem List   Diagnosis Date Noted  . Middle cerebral artery embolism, left 03/12/2018  . Subcortical infarction ([place]) 03/11/2018  . Pancytopenia ([place])   . NSTEMI (non-ST elevated myocardial infarction) ([place])   . Dyslipidemia   . Hypotension   . Acute DVT (deep venous thrombosis) ([place])   . Malignant neoplasm of stomach ([place])   . Acute cardioembolic stroke ([place])   . CKD (chronic kidney disease), stage III ([place])   . Dysphagia   . CKD (chronic kidney disease), stage IV ([place])   . Stroke ([place]) 03/03/2018  . Port catheter in place 09/18/2016  . 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 Symptomatic anemia 03/31/2015  . B12 deficiency 12/04/2014  . Thrombocytopenia ([place]) 12/04/2014  . Encounter for antineoplastic chemotherapy 06/21/2014  . CKD (chronic kidney disease) stage 5, GFR less than 15 ml/min (HCC) 04/02/2014  . Multiple myeloma ([place]) 12/02/2013  . Major depressive disorder, recurrent episode ([place]) 11/02/2013  . Medication management 09/30/2013  . Vitamin D deficiency 03/24/2013  . Mixed hyperlipidemia 02/24/2013  . COPD (chronic obstructive pulmonary disease) with emphysema ([place]) 02/20/2013  . GERD (gastroesophageal reflux disease) 01/20/2013  . HTN (hypertension) 01/14/2013  . Asthma, chronic 01/13/2013  . Hypothyroid 01/13/2013  . Chronic diastolic heart failure ([place]) 01/13/2013  . HX: breast cancer  12/08/2012  . Hypercalcemia 12/02/2012    Expected Discharge Date: Expected Discharge Date: 03/12/18  Team Members Present: Physician leading conference: Dr. Alysia Penna Social Worker Present: Ovidio Kin, LCSW Nurse Present: Dorien Chihuahua, RN PT Present: Lavone Nian, PT OT Present: [name], OT SLP Present: Stormy Fabian, SLP PPS Coordinator present : Gunnar Fusi     Current Status/Progress Goal Weekly Team Focus  Medical        managing CVA     Bowel/Bladder   continent of bowel and bladder, LBM 2-25  remain continent of bowel and bladder, maintain regular bowel pattern  Assist with tolieting needs prn   Swallow/Nutrition/ Hydration   Pending ST eval         ADL's   CGA transfers, Supervision bathing/dressing  Mod I, Supervision shower and meal prep  dynamic standing balance, endurance, pt/family education, d/c planning   Mobility     eval pending        Communication   Pending ST eval         Safety/Cognition/ Behavioral Observations            Pain   no c/o pain   no pain   Assess pain q shift and prn   Skin   no skin issues  no skin issues   Assess skin q shift and prn      *See Care Plan and progress notes for long and short-term goals.     Barriers to Discharge  Current Status/Progress Possible Resolutions Date Resolved   Physician  Nursing                  PT                    OT Decreased caregiver support                SLP                SW Decreased caregiver support;Medical stability Does not ahve 24 hr care            Discharge Planning/Teaching Needs:    Home with intermittent assist from daughter and friend. Needs to be mod/i to be able to go home safely     Team Discussion:  Goals-mod/i level new evaluations therapy setting goals. Pt is high level and will be short length of stay.  Revisions to Treatment Plan:  New eval        I attest that I was present, lead the team conference, and concur  with the assessment and plan of the team.   [name] 03/13/2018, 9:47 AM

## 2018-03-12 NOTE — Discharge Summary (Signed)
Discharge summary job # [id number]

## 2018-03-12 NOTE — Procedures (Signed)
S/P Lt common carotid arteriogram followed by complete revascularization of occluded Lt MCA distal M1  With x 2 passes with 5 mmx 16mm embotrap retriever device achieving a TICI 3 revascularization.

## 2018-03-12 NOTE — Evaluation (Signed)
Speech Language Pathology Assessment and Plan  Patient Details  Name: Nancy Norris MRN: [id number] Date of Birth: 1940-07-25  SLP Diagnosis: Cognitive Impairments;Other (comment)(dysgraphia)  Rehab Potential: Excellent ELOS: original ELOS 7-10 days, however extension of infarct noted 2/26 PM and pt transferred to 4N thus ELOS will be better determined when pt returns to CIR    Today's Date: 03/12/2018 SLP Individual Time: 1000-1100 SLP Individual Time Calculation (min): 60 min   Problem List:  Patient Active Problem List   Diagnosis Date Noted  . Subcortical infarction ([place]) 03/11/2018  . Pancytopenia ([place])   . NSTEMI (non-ST elevated myocardial infarction) ([place])   . Dyslipidemia   . Hypotension   . Acute DVT (deep venous thrombosis) ([place])   . Malignant neoplasm of stomach ([place])   . Acute cardioembolic stroke ([place])   . CKD (chronic kidney disease), stage III ([place])   . Dysphagia   . CKD (chronic kidney disease), stage IV ([place])   . Stroke ([place]) 03/03/2018  . Port catheter in place 09/18/2016  . Cryptococcal pneumonitis ([place]) 11/22/2015  . Symptomatic anemia 03/31/2015  . B12 deficiency 12/04/2014  . Thrombocytopenia ([place]) 12/04/2014  . Encounter for antineoplastic chemotherapy 06/21/2014  . CKD (chronic kidney disease) stage 5, GFR less than 15 ml/min (HCC) 04/02/2014  . Multiple myeloma ([place]) 12/02/2013  . Major depressive disorder, recurrent episode ([place]) 11/02/2013  . Medication management 09/30/2013  . Vitamin D deficiency 03/24/2013  . Mixed hyperlipidemia 02/24/2013  . COPD (chronic obstructive pulmonary disease) with emphysema ([place]) 02/20/2013  . GERD (gastroesophageal reflux disease) 01/20/2013  . HTN (hypertension) 01/14/2013  . Asthma, chronic 01/13/2013  . Hypothyroid 01/13/2013  . Chronic diastolic heart failure ([place]) 01/13/2013  . HX: breast cancer 12/08/2012  . Hypercalcemia 12/02/2012   Past Medical History:  Past Medical History:  Diagnosis Date  .  Anemia   . Anginal pain ([place])    used NTG x 2 May 31 and 06/15/13   . Anxiety   . Arthritis   . B12 deficiency 12/04/2014  . Breast cancer ([place])   . Complication of anesthesia   . COPD (chronic obstructive pulmonary disease) ([place])   . Cryptococcal pneumonitis ([place]) 11/22/2015  . Depression   . Dizziness   . Dyspnea   . Fibromyalgia   . Fibromyalgia   . GERD (gastroesophageal reflux disease)   . Headache(784.0)   . Heart murmur   . Hemoptysis 10/21/2015  . History of blood transfusion    last one May 12   . Hx of cardiovascular stress test    LexiScan with low level exercise Myoview (02/2013): No ischemia, EF 72%; normal study  . Hx of echocardiogram    a.  Echocardiogram (12/26/2012): EF 96-28%, grade 1 diastolic dysfunction;   b.  Echocardiogram (02/2013): EF 55-60%, no WMA, trivial effusion  . Hyperkalemia   . Hyponatremia   . Hypotension   . Hypothyroidism   . Mucositis   . Multiple myeloma   . Myocardial infarction [facility])    in past, patient was unaware.   . Neuropathy   . Nodule of left lung 09/13/2015  . Pain in joint, pelvic region and thigh 07/07/2015  . Pneumonia    several  . PONV (postoperative nausea and vomiting) 2008   after mastestomy   Past Surgical History:  Past Surgical History:  Procedure Laterality Date  . ABDOMINAL HYSTERECTOMY  1981  . AV FISTULA PLACEMENT Left 06/19/2013   Procedure: CREATION OF LEFT ARM ARTERIOVENOUS (AV) FISTULA ;  Surgeon:  Angelia Mould, MD;  Location: [facility];  Service: Vascular;  Laterality: Left;  . BIOPSY  03/08/2018   Procedure: BIOPSY;  Surgeon: Wilford Corner, MD;  Location: [facility];  Service: Endoscopy;;  . BREAST RECONSTRUCTION    . BREAST SURGERY Right    reduction  . CATARACT EXTRACTION, BILATERAL    . CHOLECYSTECTOMY  1971  . COLONOSCOPY    . ESOPHAGOGASTRODUODENOSCOPY (EGD) WITH PROPOFOL N/A 03/08/2018   Procedure: ESOPHAGOGASTRODUODENOSCOPY (EGD) WITH PROPOFOL;  Surgeon: Wilford Corner, MD;   Location: [facility];  Service: Endoscopy;  Laterality: N/A;  . EYE SURGERY Bilateral    lens implant  . history of Port removal    . IR IVC FILTER PLMT / S&I /IMG GUID/MOD SED  03/09/2018  . LUNG BIOPSY  10/21/2015  . MASTECTOMY Left 2008  . PORTACATH PLACEMENT  12/2012   has had 2  . RESECTION OF ARTERIOVENOUS FISTULA ANEURYSM Left 06/04/2017   Procedure: LIGATION ANEURYSM OF LEFT ARTERIOVENOUS FISTULA;  Surgeon: Angelia Mould, MD;  Location: [facility];  Service: Vascular;  Laterality: Left;  . Status post stem cell transplant on September 28, 2008.    . TEE WITHOUT CARDIOVERSION N/A 03/07/2018   Procedure: TRANSESOPHAGEAL ECHOCARDIOGRAM (TEE);  Surgeon: Buford Dresser, MD;  Location[facility] ENDOSCOPY;  Service: Cardiovascular;  Laterality: N/A;    Assessment / Plan / Recommendation Clinical Impression   HPI: Nancy Norris is a 77 year old right-handed female with past medical history of multiple myeloma in remission on treatment with systemic chemotherapy and followed by Dr. Earlie Server, remote breast cancer, COPD with remote tobacco abuse 13 years ago, chronic anemia/thrombocytopenia, fibromyalgia, hypertension,CKD stage IV with creatinine 2.29-2.93. Per chart review and patient, patient lives alone and reportedly independent prior to admission. One level home with ramped entrance. Presented 03/03/2018 with altered mental status,dysphagia, headache 2 weeks, lactic acid 1.2, elevated ammonia level. She was recently seen in the ED on 02/26/2018 for episodes of dizziness and headache as well as a fall. CT of the head was negative. Noted hemoglobin 7.9 with noted history of chronic anemia, urine culture greater than 100,000 Escherichia coli she was discharged to home after being placed on Keflex. Upon this admission cranial CT scan repeated showing 2 separate areas of abnormal cortical and subcortical edema in the left hemisphere affecting the left parietal lobe occipital lobes that were not  present on 02/26/2018. MRI the brain reviewed showing left greater than right small to moderate sized multifocal infarcts.  Per report, acute ischemia within multiple vascular territories in both hemispheres. MRA unremarkable. Patient did not receive TPA. Echocardiogram 03/05/2018 showed ejection fraction 65%.  Normal systolic function. Carotid Dopplers with no ICA stenosis. Blood cultures positive for staph species and Proteus started on vancomycin and ceftriaxone 03/06/2018 swallow study completed as well as esophagram 03/06/2018 showing nonspecific esophageal dysmotility no appreciable upper gastric diverticulum and currently maintained on a regular diet. TEE completed 03/07/2018 with ejection fraction 65% and no thrombus or vegetation. Small PFO seen by color Doppler. Patient remained on aspirin for CVA prophylaxis. Gastroenterology consulted Dr. Penelope Coop for evaluation of noted abnormal barium swallow, anemia question GI bleed. An upper GI endoscopy completed 03/08/2020 per Dr.Schooler that showed a large fungating malignant appearing ulcerated proximal stomach mass that had areas of blood seen consistent with recent bleeding and biopsies taken. Mass likely causing partial obstruction with her by mouth intake. Recommendations were for CT of abdomen and pelvis completed showing marked wall thickening in the esophagogastric junction and proximal stomach consistent with  known neoplasm. Bulky lymphadenopathy in the gastrohepatic ligament compatible with metastatic disease. There was a 2.7 cm hypoattenuating lesion in the spleen as well as scattered tiny pulmonary nodules measuring up to 5 mm and mild lymphadenopathy noted in the upper mediastinum. 1.9 x 1.2 cm irregular lesion posterior left upper lobe at the site of a 4 cm mass that was also noted on PET scan 10/11/2015 features compatible with residual of that lesion. Oncology services Dr. Earlie Server follow-up in regards to findings with pathology pending and current  plan is to await biopsy report from gastric mass to give further recommendations about management suspicious malignancy. Hospital course complicated by findings of acute DVT left posterior tibial vein, and left peroneal vein with radiology services consulted 03/09/2018 and underwent IVC filter placement per Dr. Barbie Banner of interventional radiology. On 03/10/2018 cardiology again follow-up for nonspecific chest discomfort and dizziness with elevated troponin 2.15-2.43.EKG did show new Q waves on her ECG in the inferior leads.  Repeat echocardiogram showing ejection fraction of 09% normal systolic function. The right ventricle normal systolic function. No increase in right ventricular wall thickness. It was discussed possible need for cardiac catheterization and patient not interested at this time. Patient is currently on a clear liquid diet advance as tolerated. Hemoglobin holding stable at 8.6. Pt was admitted to CIR on 03/11/18 and ST evaluation was completed 03/12/18 with results as follows:  Pt presents with mild cognitive deficits with short-term memory and complex problem solving. Pt required min semantic category cues to complete delayed recall task on the Cognistat. She also required min-mod verbal cues for problem solving during calculation and visual problem solving subtests. In addition, pt exhibits dysgraphia; she can write to dictation with ~80% accuracy when cued by clinician. Pt was screened for dysphagia and determined to be [facility] for swallow function. SLP provided thorough education regarding globus sensation and esophageal precautions given her reported difficulty swallowing thick solids (i.e. bread/meat) and recent GI assessment. Pt lived alone and was independent with all ADLs (except driving), thus she would benefit from skilled ST while inpatient to address complex problem solving, short-term memory, and written expression.    Skilled Therapeutic Interventions          Cognitive-linguistic  evaluation and dysphagia screening was completed and results were reviewed with pt.   SLP Assessment  Patient will need skilled Speech Lanaguage Pathology Services during CIR admission    Recommendations  Oral Care Recommendations: Oral care BID Patient destination: Home Follow up Recommendations: 24 hour supervision/assistance Equipment Recommended: None recommended by SLP    SLP Frequency 3 to 5 out of 7 days   SLP Duration  SLP Intensity  SLP Treatment/Interventions original ELOS 7-10 days, however extension of infarct noted 2/26 PM and pt transferred to 4N thus ELOS will be better determined when pt returns to CIR  Minumum of 1-2 x/day, 30 to 90 minutes  Cognitive remediation/compensation;Internal/external aids;Functional tasks;Cueing hierarchy;Patient/family education;Other (comment)(written langauge facilitation)    Pain Pain Assessment Pain Score: 0-No pain  Prior Functioning Cognitive/Linguistic Baseline: Within functional limits Type of Home: Apartment  Lives With: Alone Available Help at Discharge: Family;Available PRN/intermittently Vocation: Retired  Industrial/product designer Term Goals: Week 1: SLP Short Term Goal 1 (Week 1): Pt will complete functional complex problem solving tasks with Supervision A. SLP Short Term Goal 2 (Week 1): Pt will recall basic daily information with supervision cues for use of compensatory memory strategies and external aids. SLP Short Term Goal 3 (Week 1): Pt will recognize and correct spelling  errors at the word and phrase level with supervision verbal/visual cues accuracy. SLP Short Term Goal 4 (Week 1):    Refer to Care Plan for Long Term Goals  Recommendations for other services: None   Discharge Criteria: Patient will be discharged from SLP if patient refuses treatment 3 consecutive times without medical reason, if treatment goals not met, if there is a change in medical status, if patient makes no progress towards goals or if patient is  discharged from hospital.  The above assessment, treatment plan, treatment alternatives and goals were discussed and mutually agreed upon: by patient   Jettie Booze, Student SLP   Jettie Booze 03/12/2018, 4:04 PM

## 2018-03-12 NOTE — Anesthesia Postprocedure Evaluation (Signed)
Anesthesia Post Note  Patient: Nancy Norris  Procedure(s) Performed: RADIOLOGY WITH ANESTHESIA (canceled)     Patient location during evaluation: PACU Anesthesia Type: General Level of consciousness: awake and alert Pain management: pain level controlled Vital Signs Assessment: post-procedure vital signs reviewed and stable Respiratory status: spontaneous breathing, nonlabored ventilation, respiratory function stable and patient connected to nasal cannula oxygen Cardiovascular status: blood pressure returned to baseline and stable Postop Assessment: no apparent nausea or vomiting Anesthetic complications: no    Last Vitals: There were no vitals filed for this visit.  Last Pain: There were no vitals filed for this visit.               Azzan Butler A.

## 2018-03-12 NOTE — Progress Notes (Signed)
Physical Therapy Assessment and Plan  Patient Details  Name: Nancy Norris MRN: [id number] Date of Birth: [birth year]  PT Diagnosis: Abnormality of gait and Impaired cognition Rehab Potential:   good  ELOS:   3-5 days   Today's Date: 03/12/2018 PT Individual Time: 1105-1200 PT Individual Time Calculation (min): 55 min    Problem List:  Patient Active Problem List   Diagnosis Date Noted  . Subcortical infarction ([place]) 03/11/2018  . Pancytopenia ([place])   . NSTEMI (non-ST elevated myocardial infarction) ([place])   . Dyslipidemia   . Hypotension   . Acute DVT (deep venous thrombosis) ([place])   . Malignant neoplasm of stomach ([place])   . Acute cardioembolic stroke ([place])   . CKD (chronic kidney disease), stage III ([place])   . Dysphagia   . CKD (chronic kidney disease), stage IV ([place])   . Stroke ([place]) 03/03/2018  . Port catheter in place 09/18/2016  . Cryptococcal pneumonitis ([place]) 11/22/2015  . Symptomatic anemia 03/31/2015  . B12 deficiency 12/04/2014  . Thrombocytopenia ([place]) 12/04/2014  . Encounter for antineoplastic chemotherapy 06/21/2014  . CKD (chronic kidney disease) stage 5, GFR less than 15 ml/min (HCC) 04/02/2014  . Multiple myeloma ([place]) 12/02/2013  . Major depressive disorder, recurrent episode ([place]) 11/02/2013  . Medication management 09/30/2013  . Vitamin D deficiency 03/24/2013  . Mixed hyperlipidemia 02/24/2013  . COPD (chronic obstructive pulmonary disease) with emphysema ([place]) 02/20/2013  . GERD (gastroesophageal reflux disease) 01/20/2013  . HTN (hypertension) 01/14/2013  . Asthma, chronic 01/13/2013  . Hypothyroid 01/13/2013  . Chronic diastolic heart failure ([place]) 01/13/2013  . HX: breast cancer 12/08/2012  . Hypercalcemia 12/02/2012    Past Medical History:  Past Medical History:  Diagnosis Date  . Anemia   . Anginal pain ([place])    used NTG x 2 May 31 and 06/15/13   . Anxiety   . Arthritis   . B12 deficiency 12/04/2014  . Breast cancer ([place])   .  Complication of anesthesia   . COPD (chronic obstructive pulmonary disease) ([place])   . Cryptococcal pneumonitis ([place]) 11/22/2015  . Depression   . Dizziness   . Dyspnea   . Fibromyalgia   . Fibromyalgia   . GERD (gastroesophageal reflux disease)   . Headache(784.0)   . Heart murmur   . Hemoptysis 10/21/2015  . History of blood transfusion    last one May 12   . Hx of cardiovascular stress test    LexiScan with low level exercise Myoview (02/2013): No ischemia, EF 72%; normal study  . Hx of echocardiogram    a.  Echocardiogram (12/26/2012): EF 74-08%, grade 1 diastolic dysfunction;   b.  Echocardiogram (02/2013): EF 55-60%, no WMA, trivial effusion  . Hyperkalemia   . Hyponatremia   . Hypotension   . Hypothyroidism   . Mucositis   . Multiple myeloma   . Myocardial infarction [facility])    in past, patient was unaware.   . Neuropathy   . Nodule of left lung 09/13/2015  . Pain in joint, pelvic region and thigh 07/07/2015  . Pneumonia    several  . PONV (postoperative nausea and vomiting) 2008   after mastestomy   Past Surgical History:  Past Surgical History:  Procedure Laterality Date  . ABDOMINAL HYSTERECTOMY  1981  . AV FISTULA PLACEMENT Left 06/19/2013   Procedure: CREATION OF LEFT ARM ARTERIOVENOUS (AV) FISTULA ;  Surgeon: Angelia Mould, MD;  Location: [place];  Service: Vascular;  Laterality: Left;  . BIOPSY  03/08/2018   Procedure: BIOPSY;  Surgeon: Wilford Corner, MD;  Location: [place];  Service: Endoscopy;;  . BREAST RECONSTRUCTION    . BREAST SURGERY Right    reduction  . CATARACT EXTRACTION, BILATERAL    . CHOLECYSTECTOMY  1971  . COLONOSCOPY    . ESOPHAGOGASTRODUODENOSCOPY (EGD) WITH PROPOFOL N/A 03/08/2018   Procedure: ESOPHAGOGASTRODUODENOSCOPY (EGD) WITH PROPOFOL;  Surgeon: Wilford Corner, MD;  Location: [place];  Service: Endoscopy;  Laterality: N/A;  . EYE SURGERY Bilateral    lens implant  . history of Port removal    . IR IVC FILTER PLMT /  S&I /IMG GUID/MOD SED  03/09/2018  . LUNG BIOPSY  10/21/2015  . MASTECTOMY Left 2008  . PORTACATH PLACEMENT  12/2012   has had 2  . RESECTION OF ARTERIOVENOUS FISTULA ANEURYSM Left 06/04/2017   Procedure: LIGATION ANEURYSM OF LEFT ARTERIOVENOUS FISTULA;  Surgeon: Angelia Mould, MD;  Location: [place];  Service: Vascular;  Laterality: Left;  . Status post stem cell transplant on September 28, 2008.    . TEE WITHOUT CARDIOVERSION N/A 03/07/2018   Procedure: TRANSESOPHAGEAL ECHOCARDIOGRAM (TEE);  Surgeon: Buford Dresser, MD;  Location: [facility] ENDOSCOPY;  Service: Cardiovascular;  Laterality: N/A;    Assessment & Plan Clinical Impression: Patient is a 78 year old right-handed female with past medical history of multiple myeloma in remission on treatment with systemic chemotherapy and followed by Dr. Earlie Server, remote breast cancer, COPD with remote tobacco abuse 13 years ago, chronic anemia/thrombocytopenia, fibromyalgia, hypertension,CKD stage IV with creatinine 2.29-2.93. Per chart review and patient, patient lives alone and reportedly independent prior to admission. One level home with ramped entrance. Presented 03/03/2018 with altered mental status,dysphagia, headache 2 weeks, lactic acid 1.2, elevated ammonia level. She was recently seen in the ED on 02/26/2018 for episodes of dizziness and headache as well as a fall. CT of the head was negative. Noted hemoglobin 7.9 with noted history of chronic anemia, urine culture greater than 100,000 Escherichia coli she was discharged to home after being placed on Keflex. Upon this admission cranial CT scan repeated showing 2 separate areas of abnormal cortical and subcortical edema in the left hemisphere affecting the left parietal lobe occipital lobes that were not present on 02/26/2018. MRI the brain reviewed showing left greater than right small to moderate sized multifocal infarcts.  Per report, acute ischemia within multiple vascular territories in  both hemispheres. MRA unremarkable. Patient did not receive TPA. Echocardiogram 03/05/2018 showed ejection fraction 65%.  Normal systolic function. Carotid Dopplers with no ICA stenosis. Blood cultures positive for staph species and Proteus started on vancomycin and ceftriaxone 03/06/2018 swallow study completed as well as esophagram 03/06/2018 showing nonspecific esophageal dysmotility no appreciable upper gastric diverticulum and currently maintained on a regular diet. TEE completed 03/07/2018 with ejection fraction 65% and no thrombus or vegetation. Small PFO seen by color Doppler. Patient remained on aspirin for CVA prophylaxis. Gastroenterology consulted Dr. Penelope Coop for evaluation of noted abnormal barium swallow, anemia question GI bleed. An upper GI endoscopy completed 03/08/2020 per Dr.Schooler that showed a large fungating malignant appearing ulcerated proximal stomach mass that had areas of blood seen consistent with recent bleeding and biopsies taken. Mass likely causing partial obstruction with her by mouth intake. Recommendations were for CT of abdomen and pelvis completed showing marked wall thickening in the esophagogastric junction and proximal stomach consistent with known neoplasm. Bulky lymphadenopathy in the gastrohepatic ligament compatible with metastatic disease. There was a 2.7 cm hypoattenuating lesion in the spleen as well as  scattered tiny pulmonary nodules measuring up to 5 mm and mild lymphadenopathy noted in the upper mediastinum. 1.9 x 1.2 cm irregular lesion posterior left upper lobe at the site of a 4 cm mass that was also noted on PET scan 10/11/2015 features compatible with residual of that lesion. Oncology services Dr. Earlie Server follow-up in regards to findings with pathology pending and current plan is to await biopsy report from gastric mass to give further recommendations about management suspicious malignancy. Hospital course complicated by findings of acute DVT left posterior  tibial vein, and left peroneal vein with radiology services consulted 03/09/2018 and underwent IVC filter placement per Dr. Barbie Banner of interventional radiology. On 03/10/2018 cardiology again follow-up for nonspecific chest discomfort and dizziness with elevated troponin 2.15-2.43.EKG did show new Q waves on her ECG in the inferior leads.  Repeat echocardiogram showing ejection fraction of 62% normal systolic function. The right ventricle normal systolic function. No increase in right ventricular wall thickness. It was discussed possible need for cardiac catheterization and patient not interested at this time. Patient is currently on a clear liquid diet advance as tolerated. Hemoglobin holding stable at 8.6.   Patient transferred to CIR on 03/11/2018 .   Patient currently requires supervision with mobility secondary to muscle weakness and decreased problem solving.  Prior to hospitalization, patient was independent  with mobility and lived with Alone in a [place] home.  Home access is  Ramped entrance.  Patient will benefit from skilled PT intervention to maximize safe functional mobility, minimize fall risk and decrease caregiver burden for planned discharge home with intermittent assist.  Anticipate patient will benefit from follow up [facility] at discharge.  PT - End of Session Activity Tolerance: Tolerates 10 - 20 min activity with multiple rests PT Assessment Rehab Potential (ACUTE/IP ONLY): Good PT Barriers to Discharge: Medical stability PT Patient demonstrates impairments in the following area(s): Balance;Endurance;Motor;Safety;Sensory PT Transfers Functional Problem(s): Bed Mobility;Bed to Chair;Car;Furniture;Floor PT Locomotion Functional Problem(s): Stairs;Wheelchair Mobility;Ambulation PT Plan PT Intensity: Minimum of 1-2 x/day ,45 to 90 minutes PT Frequency: 5 out of 7 days PT Duration Estimated Length of Stay: 3-5 PT Treatment/Interventions: Ambulation/gait training;Balance/vestibular  training;Community reintegration;Discharge planning;Disease management/prevention;DME/adaptive equipment instruction;Functional electrical stimulation;Cognitive remediation/compensation;Neuromuscular re-education;Functional mobility training;Pain management;Patient/family education;Psychosocial support;Splinting/orthotics;Therapeutic Activities;UE/LE Coordination activities;UE/LE Strength taining/ROM;Visual/perceptual remediation/compensation;Wheelchair propulsion/positioning;Skin care/wound Landscape architect;Therapeutic Exercise PT Transfers Anticipated Outcome(s): Mod I with LRAD  PT Locomotion Anticipated Outcome(s): Mod I with LRAD  PT Recommendation Follow Up Recommendations: Home health PT Patient destination: Home Equipment Recommended: To be determined  Skilled Therapeutic Intervention Pt received sitting in WC and agreeable to PT. PT instructed patient in PT Evaluation and initiated treatment intervention; see below for results. PT educated patient in [place], rehab potential, rehab goals, and discharge recommendations.     PT Evaluation Precautions/Restrictions Precautions Precautions: Fall Restrictions Weight Bearing Restrictions: No General   Vital SignsTherapy Vitals Pulse Rate: 72 BP: 99/65 Patient Position (if appropriate): Sitting Pain Pain Assessment Pain Scale: 0-10 Pain Score: 8  Pain Type: Acute pain Pain Location: Head Pain Orientation: Right;Medial Pain Onset: On-going Pain Intervention(s): RN made aware Home Living/Prior Functioning Home Living Available Help at Discharge: Neighbor;Available PRN/intermittently;Family Type of Home: Apartment Home Access: Ramped entrance Home Layout: One level Bathroom Shower/Tub: Chiropodist: Standard Bathroom Accessibility: Yes  Lives With: Alone Prior Function Level of Independence: Independent with basic ADLs;Independent with homemaking with ambulation;Independent with gait  Able to Take  Stairs?: Yes Driving: No Vocation: Retired Comments: Rides [employer] for transportation; walks without DME. Does cooking/cleaning. ;  cancer center provides rides; enjoys "diamond" painting; enjoys crafts; does her own Patent attorney Vision/Perception     Cognition Overall Cognitive Status: Impaired/Different from baseline Arousal/Alertness: Awake/alert Orientation Level: Oriented X4 Sensation Sensation Light Touch: Impaired by gross assessment(baseline neuropathy in BLE and fingertips of BUE. ) Proprioception: Appears Intact Additional Comments: parathesia in BLE with peripheral neuropathy  Coordination Gross Motor Movements are Fluid and Coordinated: Yes Fine Motor Movements are Fluid and Coordinated: Yes Finger Nose Finger Test: mild baseline tremor on the L  Heel Shin Test: [facility] Motor  Motor Motor: Within Functional Limits  Mobility Bed Mobility Bed Mobility: Rolling Right;Rolling Left;Sit to Supine;Supine to Sit Rolling Right: Supervision/verbal cueing Rolling Left: Supervision/Verbal cueing Supine to Sit: Supervision/Verbal cueing Sit to Supine: Supervision/Verbal cueing Transfers Transfers: Sit to Stand;Stand to Sit Sit to Stand: Supervision/Verbal cueing Stand to Sit: Supervision/Verbal cueing Locomotion  Gait Ambulation: Yes Gait Assistance: Supervision/Verbal cueing Gait Distance (Feet): 150 Feet Assistive device: None Gait Gait: Yes Stairs / Additional Locomotion Stairs: Yes Stairs Assistance: Contact Guard/Touching assist Stair Management Technique: One rail Right Number of Stairs: 8 Height of Stairs: 6 Wheelchair Mobility Wheelchair Mobility: Yes Wheelchair Assistance: Chartered loss adjuster: Both upper extremities Wheelchair Parts Management: Supervision/cueing Distance: 150  Trunk/Postural Assessment  Cervical Assessment Cervical Assessment: Within Functional Limits Thoracic Assessment Thoracic Assessment:  Within Functional Limits Lumbar Assessment Lumbar Assessment: Within Functional Limits Postural Control Postural Control: Within Functional Limits  Balance Balance Balance Assessed: Yes Dynamic Sitting Balance Dynamic Sitting - Level of Assistance: 6: Modified independent (Device/Increase time) Dynamic Standing Balance Dynamic Standing - Level of Assistance: 5: Stand by assistance Extremity Assessment      RLE Assessment RLE Assessment: Within Functional Limits General Strength Comments: grossly 4+/5 to 5/5 proximal to distal  LLE Assessment General Strength Comments: grossly 4+/5 to 5/5 proximal to distal     Refer to Care Plan for Long Term Goals  Recommendations for other services: None   Discharge Criteria: Patient will be discharged from PT if patient refuses treatment 3 consecutive times without medical reason, if treatment goals not met, if there is a change in medical status, if patient makes no progress towards goals or if patient is discharged from hospital.  The above assessment, treatment plan, treatment alternatives and goals were discussed and mutually agreed upon: by patient  Lorie Phenix 03/12/2018, 11:30 AM

## 2018-03-12 NOTE — Progress Notes (Signed)
Patient ID: Nancy Norris, female   DOB: 1940-07-30, 78 y.o.   MRN: [id number] INR. Post procedure CT brain shows nom new ICH ,mass effect or midline shift. Extubated without difficulty. Maintaining O2 sats. Pupils 65mm RT = LT Obeys simple commands appropriately Moves all 4s. RT groin soft.Distal pulses dopplerable  DPs and PTs. S.Apoorva Bugay MD

## 2018-03-12 NOTE — Anesthesia Procedure Notes (Signed)
Procedure Name: Intubation Date/Time: 03/12/2018 3:42 PM Performed by: Inda Coke, CRNA Pre-anesthesia Checklist: Patient identified, Emergency Drugs available, Suction available and Patient being monitored Patient Re-evaluated:Patient Re-evaluated prior to induction Oxygen Delivery Method: Circle System Utilized Preoxygenation: Pre-oxygenation with 100% oxygen Induction Type: IV induction, Rapid sequence and Cricoid Pressure applied Ventilation: Mask ventilation without difficulty Laryngoscope Size: McGraph and 4 Grade View: Grade I Tube type: Oral Number of attempts: 1 Airway Equipment and Method: Stylet and Rigid stylet Placement Confirmation: ETT inserted through vocal cords under direct vision,  positive ETCO2 and breath sounds checked- equal and bilateral Secured at: 22 cm Tube secured with: Tape Dental Injury: Teeth and Oropharynx as per pre-operative assessment  Difficulty Due To: Difficulty was unanticipated and Difficult Airway- due to anterior larynx Comments: DL X 1 with MAC 3 blade and grade III view. DL X 1 with glidescope go and MAC 4 blade. Grade I view and successful intubation

## 2018-03-12 NOTE — Progress Notes (Addendum)
[facility] PHYSICAL MEDICINE & REHABILITATION PROGRESS NOTE   Subjective/Complaints: We discussed DNR which she wishes to continue  Has pain around port a cath  ROS- Denies CP, SOB, N/V/D   Objective:   No results found. Recent Labs    03/11/18 0500 03/12/18 0820  WBC 3.7* 4.7  HGB 8.6* 9.6*  HCT 26.9* 30.9*  PLT 52* 58*   Recent Labs    03/11/18 0500 03/12/18 0820  NA 140 138  K 3.9 3.9  CL 108 101  CO2 27 26  GLUCOSE 105* 112*  BUN 13 13  CREATININE 2.13* 2.39*  CALCIUM 8.8* 9.2    Intake/Output Summary (Last 24 hours) at 03/12/2018 1044 Last data filed at 03/12/2018 0841 Gross per 24 hour  Intake 250 ml  Output -  Net 250 ml     Physical Exam: Vital Signs Blood pressure 99/65, pulse 72, temperature 98.5 F (36.9 C), temperature source Oral, resp. rate 16, weight 80.1 kg, SpO2 98 %.  Neck no ecchymosis General: No acute distress Mood and affect are appropriate Heart: Regular rate and rhythm no rubs murmurs or extra sounds Lungs: Clear to auscultation, breathing unlabored, no rales or wheezes Abdomen: Positive bowel sounds, soft nontender to palpation, nondistended Extremities: No clubbing, cyanosis, or edema Skin: No evidence of breakdown, no evidence of rash Neurologic: Cranial nerves II through XII intact, motor strength is 4/5 in bilateral deltoid, bicep, tricep, grip, hip flexor, knee extensors, ankle dorsiflexor and plantar flexor Sensory exam normal sensation to light touch and proprioception in bilateral upper and lower extremities Cerebellar exam normal finger to nose to finger as well as heel to shin in bilateral upper and lower extremities Musculoskeletal: Full range of motion in all 4 extremities. No joint swelling  Pt seen after call from RN Pt now with aphasia and dysarthria 0/5 RUE 2- Hip /knee extension otherwise 0/5 with increase LE extensor tone  Assessment/Plan: 1. Functional deficits secondary to Bilateral cortical or  subcortical infarcts which require 3+ hours per day of interdisciplinary therapy in a comprehensive inpatient rehab setting.  Physiatrist is providing close team supervision and 24 hour management of active medical problems listed below.  Physiatrist and rehab team continue to assess barriers to discharge/monitor patient progress toward functional and medical goals  Care Tool:  Bathing              Bathing assist       Upper Body Dressing/Undressing Upper body dressing   What is the patient wearing?: Hospital gown only    Upper body assist Assist Level: Independent    Lower Body Dressing/Undressing Lower body dressing      What is the patient wearing?: Underwear/pull up     Lower body assist Assist for lower body dressing: Independent     Toileting Toileting    Toileting assist Assist for toileting: Independent with assistive device Assistive Device Comment: (stand by assit)   Transfers Chair/bed transfer  Transfers assist     Chair/bed transfer assist level: Independent     Locomotion Ambulation   Ambulation assist              Walk 10 feet activity   Assist           Walk 50 feet activity   Assist           Walk 150 feet activity   Assist           Walk 10 feet on uneven surface  activity  Assist           Wheelchair     Assist               Wheelchair 50 feet with 2 turns activity    Assist            Wheelchair 150 feet activity     Assist           Medical Problem List and Plan: 1.  Confusion/AMS with gait disorder secondary to bilateral cortical and subcortical infarcts and various territories felt to be embolic             PT, OT, Evals  Acute onset RIght hemiparesis and aphasia - suspect new Left MCA infarct check CT- have notified neuro   Pt has had acute neurologic change within 36mn of initial visit, pt had no cognitive deficits early this am and was able to state  her desire to cont DNR Pt went from 4/5 in BUE and BLE to 0/5  In RUE , 2- RLE with increased LE extensor tone Also acute onset of dysarthria and exp aphasia  Am labs unremarkable 2.  Antithrombotics: -DVT/anticoagulation:  Acute DVT left posterior tibial vein, left peroneal vein per vascular study. Status post IVC filter 03/09/2018 per interventional radiology             -antiplatelet therapy: aspirin 325 mg daily 3. Pain Management: Neurontin 600 mg daily at bedtime and 300 mg daily as needed, tramadol as needed 4. Mood: Wellbutrin 75 mg twice a day, Celexa 40 mg daily. Provide emotional support             -antipsychotic agents: see above 5. Neuropsych: This patient is not fully capable of making decisions on her own behalf. 6. Skin/Wound Care:  Routine skin checks 7. Fluids/Electrolytes/Nutrition:  Routine and out's with follow-up chemistries in the a.m. 8. History of multiple myeloma with pancytopenia. Followed by Dr. MEarlie Server9. Findings a large fungating ulcerated proximal gastric mass suspicious for malignancy as well as GI bleed. Pathology report pending. Follow-up oncology services on plan of care 10. NSTEMI. Follow-up cardiology services.presently with no chest pain 11.Postural hypotension. Continue ProAmatine 10 mg daily Vitals:   03/12/18 0440 03/12/18 0859  BP: 118/60 99/65  Pulse: 100 72  Resp: 16   Temp: 98.5 F (36.9 C)   SpO2: 98%    12.CKD stage IV.avoid nephrotoxins. Avoid contrast.history of AV fistula placement 2015 Followed by nephrology as outpatient. 13. Dysphagia. Esophagram results noted. Follow-up gastroenterology. Speech therapy to follow.             Currently on soft diet 14. Hypothyroidism. Synthroid 15. Hyperlipidemia. Lipitor  16.  Discussed with pt, She is competent and wishes to remain DNR  LOS: 1 days A FACE TO FLos IndiosE Alexxus Sobh 03/12/2018, 10:44 AM

## 2018-03-12 NOTE — Progress Notes (Signed)
Patient ID: Nancy Norris, female   DOB: 1940/03/11, 78 y.o.   MRN: [id number] INR. 71 Y F LSW 13 50 inpatient undergoing rehab.New onset of rt sided weakness and aphasia. CT No ICH. ASPECTS  10. CTA occluded Lt MCA M 1 distal to ant temp branch.  Endovascular revascularization D/W patients daughter over the phone. Procedure,reasons,risksalternatives discussed.Risks of ICH 10 % ,worsening neuro condition,death ,inability to revascullarize and vascular injury reviewed. Daughter expressed understanding and gave  a witnessed informed consent for revascularization. S>Sacha Topor MD.

## 2018-03-12 NOTE — Progress Notes (Signed)
CT scan results noted evolution of infarct with CT angiogram of head and neck pending. Plan is currently to discharge to 4 N. Acute care for ongoing evaluation and care.

## 2018-03-12 NOTE — Progress Notes (Deleted)
  The note originally documented on this encounter has been moved the the encounter in which it belongs.  

## 2018-03-12 NOTE — Anesthesia Preprocedure Evaluation (Addendum)
Anesthesia Evaluation  Patient identified by MRN, date of birth, ID band Patient awake    Reviewed: Allergy & Precautions, NPO status , Patient's Chart, lab work & pertinent test results  History of Anesthesia Complications (+) PONV and history of anesthetic complications  Airway Mallampati: III  TM Distance: >3 FB Neck ROM: Full    Dental  (+) Poor Dentition, Missing, Chipped   Pulmonary shortness of breath, asthma , pneumonia, resolved, COPD,  COPD inhaler, former smoker,    Pulmonary exam normal breath sounds clear to auscultation       Cardiovascular hypertension, Pt. on medications + angina with exertion + Past MI  Normal cardiovascular exam Rhythm:Regular Rate:Normal     Neuro/Psych  Headaches, PSYCHIATRIC DISORDERS Anxiety Depression  Neuromuscular disease CVA, Residual Symptoms    GI/Hepatic GERD  Medicated and Controlled,  Endo/Other  Hypothyroidism   Renal/GU Renal disease  negative genitourinary   Musculoskeletal  (+) Arthritis , Osteoarthritis,  Fibromyalgia -  Abdominal   Peds  Hematology  (+) anemia ,   Anesthesia Other Findings   Reproductive/Obstetrics                             Anesthesia Physical Anesthesia Plan  ASA: IV and emergent  Anesthesia Plan: General   Post-op Pain Management:    Induction: Intravenous, Rapid sequence and Cricoid pressure planned  PONV Risk Score and Plan: 4 or greater and Ondansetron, Dexamethasone and Treatment may vary due to age or medical condition  Airway Management Planned: Oral ETT  Additional Equipment:   Intra-op Plan:   Post-operative Plan: Extubation in OR  Informed Consent: I have reviewed the patients History and Physical, chart, labs and discussed the procedure including the risks, benefits and alternatives for the proposed anesthesia with the patient or authorized representative who has indicated his/her  understanding and acceptance.     Dental advisory given  Plan Discussed with: CRNA and Surgeon  Anesthesia Plan Comments:         Anesthesia Quick Evaluation

## 2018-03-12 NOTE — Transfer of Care (Addendum)
Immediate Anesthesia Transfer of Care Note  Patient: Nancy Norris  Procedure(s) Performed: RADIOLOGY WITH ANESTHESIA (canceled)  Patient Location: PACU  Anesthesia Type:General  Level of Consciousness: awake and alert   Airway & Oxygen Therapy: Patient Spontanous Breathing and Patient connected to nasal cannula oxygen  Post-op Assessment: Report given to RN, Post -op Vital signs reviewed and stable, Patient moving all extremities X 4 and Patient able to stick tongue midline  Post vital signs: Reviewed and stable  Last Vitals:  Vitals Value Taken Time  BP    Temp    Pulse    Resp    SpO2      Last Pain: There were no vitals filed for this visit.       Complications: No apparent anesthesia complications

## 2018-03-12 NOTE — Progress Notes (Signed)
Per nursing, patient was given "Data Collection Information Summary for Patients in Inpatient Rehabilitation Facilities with attached [place] Records" upon admission which was provided in the patient education notebook.    Patient information reviewed and entered into [facility] by Ileana Ladd, [place] coordinator. Information including medical coding, function ability, and quality indicators will be reviewed and updated through discharge.

## 2018-03-12 NOTE — Progress Notes (Signed)
VAST RN to pt's room for 2nd time today to re-access chest implanted port. Pt out of room both attempts.

## 2018-03-12 NOTE — Progress Notes (Signed)
Occupational Therapy Session Note  Patient Details  Name: Nancy Norris MRN: [id number] Date of Birth: January 07, 1941  Today's Date: 03/12/2018 OT Individual Time: 1347-1405 OT Individual Time Calculation (min): 18 min   Short Term Goals: Week 1:  OT Short Term Goal 1 (Week 1): STG = LTGs due to ELOS  Skilled Therapeutic Interventions/Progress Updates:  Upon arrival, pt received upright in w/c appearing fatigued.  When asked how pt was feeling, she responded with garbled, unintelligible speech.  Notified RN of change in speech.  Upon further assessment, pt unable to follow directions nor move RUE or RLE.  Returned to bed max assist sit > stand and +2 stand pivot transfer.  Pt remained semi-reclined in bed with PA and MD assessing pt.  Stat CT ordered and neurology consulted.    Post CT, pt to be transferred to 4N for further evaluation and care.  Therapy Documentation Precautions:  Precautions Precautions: Fall Restrictions Weight Bearing Restrictions: No General: General OT Amount of Missed Time: 12 Minutes Vital Signs: Therapy Vitals Pulse Rate: 68 BP: 109/63 Pain: Pain Assessment Pain Score: 0-No pain ADL: ADL Grooming: Supervision/safety Where Assessed-Grooming: Standing at sink Upper Body Bathing: Supervision/safety Where Assessed-Upper Body Bathing: Standing at sink Lower Body Bathing: Supervision/safety Where Assessed-Lower Body Bathing: Sitting at sink, Standing at sink Upper Body Dressing: Supervision/safety Where Assessed-Upper Body Dressing: Sitting at sink Lower Body Dressing: Supervision/safety Where Assessed-Lower Body Dressing: Standing at sink, Sitting at sink Toilet Transfer: Therapist, music Method: Ambulating Tub/Shower Transfer: Minimal assistance, Minimal cueing Tub/Shower Transfer Method: Ambulating   Therapy/Group: Individual Therapy  [name] 03/12/2018, 2:54 PM

## 2018-03-12 NOTE — H&P (Signed)
Stroke Neurology Admission Note  The history was obtained from the [facility] staff and Dr. Letta Pate.  During history and examination, all items were able to obtain unless otherwise noted.  History of Present Illness:  Nancy Norris is a 78 y.o. Caucasian female with PMH of remote breast cancer, COPD, CKD stage IV on chemo therapy, MI/CAD, multiple myeloma in remission, recently discharged from [facility] to CIR for bilateral cortical and subcortical infarcts. She was doing well in CIR and was able to ambulate and speak well. Last seen at baseline 1350. Around 1405 pt was found not able to speak with significant right sided weakness. Code stroke called. On arrival, pt had left gaze preference, right hemianopia, right facial droop and right hemiplegia. NIHSS = 21. CT no acute change but subacute infarcts. CTA head and neck showed left M2 high grade stenosis vs. Near occlusive thrombus. Pt was sent to IR for emergent mechanical thrombectomy. Her platelet 58 and was treated with platelet transfusion in preparation of procedure.   Pt was admitted on 03/03/18 for episodes of amnesia, confusion, and headache.  CT showed left occipital and left frontal parietal infarcts.  MRI confirmed multiple posterior and anterior bilateral infarcts. MRA normal, CUS unremarkable. EF 60-65%. TEE small PFO. LDL 85 and A1C 5.1. however, pt had pancytopenia, not AC candidate. No loop placed. She was discharged to CIR with ASA '325mg'$  and lipitor 20.  Found to have right lower extremity DVT, put on IVC filter.  While in CIR, patient has been doing well, gradually improving. However, CT chest/abd/pelvic showed a large fungating ulcerated proximal gastric mass suspicious for malignancy. She has been following with Dr. Earlie Server and output follow up has been arranged.   LSN: [id number] today tPA Given: No: recent stroke, low platelet at 59  Past Medical History:  Diagnosis Date  . Anemia   . Anginal pain ([place])    used NTG x 2 May 31 and 06/15/13   .  Anxiety   . Arthritis   . B12 deficiency 12/04/2014  . Breast cancer ([place])   . Complication of anesthesia   . COPD (chronic obstructive pulmonary disease) ([place])   . Cryptococcal pneumonitis ([place]) 11/22/2015  . Depression   . Dizziness   . Dyspnea   . Fibromyalgia   . Fibromyalgia   . GERD (gastroesophageal reflux disease)   . Headache(784.0)   . Heart murmur   . Hemoptysis 10/21/2015  . History of blood transfusion    last one May 12   . Hx of cardiovascular stress test    LexiScan with low level exercise Myoview (02/2013): No ischemia, EF 72%; normal study  . Hx of echocardiogram    a.  Echocardiogram (12/26/2012): EF 62-69%, grade 1 diastolic dysfunction;   b.  Echocardiogram (02/2013): EF 55-60%, no WMA, trivial effusion  . Hyperkalemia   . Hyponatremia   . Hypotension   . Hypothyroidism   . Mucositis   . Multiple myeloma   . Myocardial infarction [facility])    in past, patient was unaware.   . Neuropathy   . Nodule of left lung 09/13/2015  . Pain in joint, pelvic region and thigh 07/07/2015  . Pneumonia    several  . PONV (postoperative nausea and vomiting) 2008   after mastestomy    Past Surgical History:  Procedure Laterality Date  . ABDOMINAL HYSTERECTOMY  1981  . AV FISTULA PLACEMENT Left 06/19/2013   Procedure: CREATION OF LEFT ARM ARTERIOVENOUS (AV) FISTULA ;  Surgeon: Angelia Mould, MD;  Location: [facility] OR;  Service: Vascular;  Laterality: Left;  . BIOPSY  03/08/2018   Procedure: BIOPSY;  Surgeon: Wilford Corner, MD;  Location: [place];  Service: Endoscopy;;  . BREAST RECONSTRUCTION    . BREAST SURGERY Right    reduction  . CATARACT EXTRACTION, BILATERAL    . CHOLECYSTECTOMY  1971  . COLONOSCOPY    . ESOPHAGOGASTRODUODENOSCOPY (EGD) WITH PROPOFOL N/A 03/08/2018   Procedure: ESOPHAGOGASTRODUODENOSCOPY (EGD) WITH PROPOFOL;  Surgeon: Wilford Corner, MD;  Location: [place];  Service: Endoscopy;  Laterality: N/A;  . EYE SURGERY Bilateral    lens  implant  . history of Port removal    . IR IVC FILTER PLMT / S&I /IMG GUID/MOD SED  03/09/2018  . LUNG BIOPSY  10/21/2015  . MASTECTOMY Left 2008  . PORTACATH PLACEMENT  12/2012   has had 2  . RESECTION OF ARTERIOVENOUS FISTULA ANEURYSM Left 06/04/2017   Procedure: LIGATION ANEURYSM OF LEFT ARTERIOVENOUS FISTULA;  Surgeon: Angelia Mould, MD;  Location: [place];  Service: Vascular;  Laterality: Left;  . Status post stem cell transplant on September 28, 2008.    . TEE WITHOUT CARDIOVERSION N/A 03/07/2018   Procedure: TRANSESOPHAGEAL ECHOCARDIOGRAM (TEE);  Surgeon: Buford Dresser, MD;  Location: [facility] ENDOSCOPY;  Service: Cardiovascular;  Laterality: N/A;    Family History  Problem Relation Age of Onset  . Arthritis Mother   . Asthma Mother   . Cancer Sister   . Hyperlipidemia Brother     Social History:  reports that she quit smoking about 13 years ago. Her smoking use included cigarettes. She has a 30.00 pack-year smoking history. She has never used smokeless tobacco. She reports that she does not drink alcohol or use drugs.  Allergies:  Allergies  Allergen Reactions  . Codeine Anaphylaxis    anaphylasix tolarates oxycodone per care cast  . Latex Shortness Of Breath  . Chocolate     migraines  . Onion Other (See Comments)    Causes migraine headaches  . Zyprexa [Olanzapine] Other (See Comments)    Confusion , dizzy,unsteady  . Adhesive [Tape] Other (See Comments)    blisters  . Heparin Rash    Can flush port with heparin  . Hydrocodone Rash    With extreme itching  . Iodinated Diagnostic Agents Hives, Itching, Rash and Other (See Comments)    Happened 60 years ago Stage IV kidney function  . Oxycodone Rash  . Promethazine-Dm Hives  . Sulfa Antibiotics Itching    Current Facility-Administered Medications on File Prior to Encounter  Medication Dose Route Frequency Provider Last Rate Last Dose  . 0.9 %  sodium chloride infusion (Manually program via Guardrails  IV Fluids)   Intravenous Once Rosalin Hawking, MD      . 0.9 %  sodium chloride infusion    Continuous PRN Inda Coke, CRNA      . acetaminophen (TYLENOL) tablet 650 mg  650 mg Oral Q6H PRN Angiulli, Lavon Paganini, PA-C       Or  . acetaminophen (TYLENOL) suppository 650 mg  650 mg Rectal Q6H PRN Angiulli, Lavon Paganini, PA-C      . albuterol (PROVENTIL) (2.5 MG/3ML) 0.083% nebulizer solution 3 mL  3 mL Inhalation Q6H PRN Angiulli, Lavon Paganini, PA-C      . aspirin 325 MG tablet           . aspirin 81 MG chewable tablet           . aspirin tablet 325 mg  325 mg Oral Daily Cathlyn Parsons, PA-C   325 mg at 03/12/18 2778  . atorvastatin (LIPITOR) tablet 20 mg  20 mg Oral q1800 AngiulliLavon Paganini, PA-C   20 mg at 03/11/18 1703  . buPROPion [facility]) tablet 75 mg  75 mg Oral BID Cathlyn Parsons, PA-C   75 mg at 03/12/18 2423  . ceFAZolin (ANCEF) 2-4 GM/100ML-% IVPB           . ceFAZolin (ANCEF) IVPB 2 g/50 mL premix   Intravenous Anesthesia Intra-op Inda Coke, CRNA   2 g at 03/12/18 1547  . citalopram (CELEXA) tablet 40 mg  40 mg Oral Daily Cathlyn Parsons, PA-C   40 mg at 03/12/18 5361  . clopidogrel (PLAVIX) 300 MG tablet           . cromolyn (OPTICROM) 4 % ophthalmic solution 1-2 drop  1-2 drop Both Eyes QID PRN Angiulli, Lavon Paganini, PA-C      . dexamethasone (DECADRON) injection    Anesthesia Intra-op Inda Coke, CRNA   4 mg at 03/12/18 1626  . ePHEDrine injection    Anesthesia Intra-op Inda Coke, CRNA   10 mg at 03/12/18 1638  . eptifibatide (INTEGRILIN) 20 MG/10ML injection           . feeding supplement (ENSURE ENLIVE) (ENSURE ENLIVE) liquid 237 mL  237 mL Oral BID BM Cathlyn Parsons, PA-C   237 mL at 03/12/18 0916  . fentaNYL (SUBLIMAZE) injection    Anesthesia Intra-op Inda Coke, CRNA   100 mcg at 03/12/18 1541  . folic acid (FOLVITE) tablet 1 mg  1 mg Oral Daily AngiulliLavon Paganini, PA-C   1 mg at 03/12/18 4431  . gabapentin (NEURONTIN) capsule 300 mg  300 mg Oral Daily  PRN Angiulli, Lavon Paganini, PA-C      . gabapentin (NEURONTIN) capsule 600 mg  600 mg Oral QHS Cathlyn Parsons, PA-C   600 mg at 03/11/18 2123  . glycopyrrolate (ROBINUL) injection    Anesthesia Intra-op Inda Coke, CRNA   0.1 mg at 03/12/18 1637  . levothyroxine (SYNTHROID, LEVOTHROID) tablet 150 mcg  150 mcg Oral Q0600 Cathlyn Parsons, PA-C   150 mcg at 03/12/18 5400  . lidocaine (XYLOCAINE) 1 % (with pres) injection           . lidocaine 2% (20 mg/mL) 5 mL syringe   Intravenous Anesthesia Intra-op Inda Coke, CRNA   60 mg at 03/12/18 1541  . loratadine (CLARITIN) tablet 10 mg  10 mg Oral Daily Cathlyn Parsons, PA-C   10 mg at 03/12/18 8676  . methylPREDNISolone sodium succinate (SOLU-MEDROL) 125 mg/2 mL injection 125 mg  125 mg Intravenous STAT Kirsteins, Luanna Salk, MD      . methylPREDNISolone sodium succinate (SOLU-MEDROL) 125 mg/2 mL injection           . midodrine (PROAMATINE) tablet 10 mg  10 mg Oral Daily Cathlyn Parsons, PA-C   10 mg at 03/12/18 1950  . montelukast (SINGULAIR) tablet 10 mg  10 mg Oral QHS Cathlyn Parsons, PA-C   10 mg at 03/11/18 2123  . nitroGLYCERIN 100 mcg/mL intra-arterial injection           . nitroGLYCERIN 25 mcg in sodium chloride (PF) 0.9 % 59.71 mL (25 mcg/mL) syringe   Intra-arterial PRN Luanne Bras, MD   25 mcg at 03/12/18 1705  . phenylephrine (NEO-SYNEPHRINE) 0.04 mg/mL in sodium chloride 0.9 % 250  mL infusion    Continuous PRN Inda Coke, CRNA 15 mL/hr at 03/12/18 1704 10 mcg/min at 03/12/18 1704  . pilocarpine (SALAGEN) tablet 5 mg  5 mg Oral BID Cathlyn Parsons, PA-C   5 mg at 03/12/18 4765  . propofol (DIPRIVAN) 10 mg/mL bolus/IV push    Anesthesia Intra-op Inda Coke, CRNA   100 mg at 03/12/18 1541  . rocuronium bromide 10 mg/mL (PF) syringe (48m)   Intravenous Anesthesia Intra-op MInda Coke CRNA   50 mg at 03/12/18 1630  . sodium chloride flush (NS) 0.9 % injection 10-40 mL  10-40 mL Intracatheter PRN Kirsteins,  ALuanna Salk MD   10 mL at 03/12/18 0827  . sorbitol 70 % solution 30 mL  30 mL Oral Daily PRN Angiulli, DLavon Paganini PA-C      . succinylcholine (ANECTINE) injection    Anesthesia Intra-op MInda Coke CRNA   100 mg at 03/12/18 1541  . sucralfate (CARAFATE) 1 GM/10ML suspension 1 g  1 g Oral TID WC & HS Angiulli, DLavon Paganini PA-C   1 g at 03/12/18 1310  . ticagrelor (BRILINTA) 90 MG tablet           . tirofiban (AGGRASTAT) 5-0.9 MG/100ML-% injection           . traMADol (ULTRAM) tablet 50 mg  50 mg Oral Q6H PRN Angiulli, DLavon Paganini PA-C      . vitamin B-12 (CYANOCOBALAMIN) tablet 1,000 mcg  1,000 mcg Oral Daily ACathlyn Parsons PA-C   1,000 mcg at 03/12/18 04650  Current Outpatient Medications on File Prior to Encounter  Medication Sig Dispense Refill  . acetaminophen (TYLENOL) 500 MG tablet Take 2 tablets (1,000 mg total) by mouth daily as needed for moderate pain or headache. 30 tablet 0  . albuterol (PROVENTIL HFA;VENTOLIN HFA) 108 (90 Base) MCG/ACT inhaler Inhale 1-2 puffs into the lungs every 6 (six) hours as needed for wheezing or shortness of breath (cough). 1 Inhaler 3  . aspirin EC 325 MG tablet Take 1 tablet (325 mg total) by mouth daily. 30 tablet 0  . atorvastatin (LIPITOR) 20 MG tablet Take 1 tablet (20 mg total) by mouth daily at 6 PM.    . azelastine (ASTELIN) 0.1 % nasal spray Place 1 spray into both nostrils daily. Use in each nostril as directed 30 mL 2  . buPROPion (WELLBUTRIN) 75 MG tablet Take 1 tablet (75 mg total) by mouth 2 (two) times daily. 60 tablet 2  . citalopram (CELEXA) 40 MG tablet Take 1 tablet daily for Mood 90 tablet 1  . cromolyn (OPTICROM) 4 % ophthalmic solution INSTILL 1 TO 2 DROPS IN AFFECTED EYE(S) 4 TIMES A DAY FOR ALLERGIES. (Patient taking differently: Place 1-2 drops into both eyes 4 (four) times daily as needed (allergies). ) 10 mL PRN  . feeding supplement, ENSURE ENLIVE, (ENSURE ENLIVE) LIQD Take 237 mLs by mouth 2 (two) times daily between meals. 237  mL 12  . fluconazole (DIFLUCAN) 150 MG tablet Take one tablet at onset of symptoms and second tablet three days later for yeast. (Patient not taking: Reported on 03/03/2018) 2 tablet 0  . folic acid (FOLVITE) 1 MG tablet Take 1 tablet (1 mg total) by mouth daily. 30 tablet 3  . gabapentin (NEURONTIN) 300 MG capsule TAKE 1 CAPSULE BY MOUTH 3 TIMES DAILY. (Patient taking differently: Take 300-600 mg by mouth See admin instructions. 600 mg at bedtime and an additional 300 mg during the day  if "cramping") 90 capsule 1  . ipratropium-albuterol (DUONEB) 0.5-2.5 (3) MG/3ML SOLN Take 3 mLs by nebulization every 4 (four) hours as needed. Max:6 doses per day 540 mL 0  . levocetirizine (XYZAL) 5 MG tablet Take 1 tablet (5 mg total) by mouth every evening. 90 tablet 3  . levothyroxine (SYNTHROID, LEVOTHROID) 150 MCG tablet TAKE 1 TABLET BY MOUTH DAILY. NEED OFFICE VISIT FOR FURTHER REFILLS. (Patient taking differently: Take by mouth daily before breakfast. TAKE 1 TABLET BY MOUTH DAILY. NEED OFFICE VISIT FOR FURTHER REFILLS.) 90 tablet 0  . loperamide (IMODIUM) 2 MG capsule Take 1 capsule (2 mg total) by mouth as needed for diarrhea or loose stools. 30 capsule 2  . magic mouthwash SOLN Take 5 mLs by mouth 4 (four) times daily as needed for mouth pain. 240 mL 1  . midodrine (PROAMATINE) 10 MG tablet Take 1 tablet daily to support BP (Patient taking differently: Take 10 mg by mouth daily. ) 90 tablet 3  . montelukast (SINGULAIR) 10 MG tablet Take 1 tablet daily for Allergies 90 tablet 1  . nitroGLYCERIN (NITROSTAT) 0.4 MG SL tablet Place 0.4 mg under the tongue every 5 (five) minutes as needed for chest pain.     Marland Kitchen nystatin (MYCOSTATIN/NYSTOP) powder Apply topically 2 (two) times daily. 30 g 0  . ondansetron (ZOFRAN ODT) 8 MG disintegrating tablet Take 1 tablet (8 mg total) by mouth every 8 (eight) hours as needed for nausea or vomiting. 20 tablet 0  . pantoprazole (PROTONIX) 40 MG tablet Take 1 tablet (40 mg total)  by mouth daily.    . pilocarpine (SALAGEN) 5 MG tablet TAKE 1 TABLET BY MOUTH 3 TIMES DAILY FOR DRY MOUTH. (Patient taking differently: Take 5 mg by mouth 2 (two) times daily. ) 270 tablet 1  . ranitidine (ZANTAC) 300 MG tablet Take 1 tablet 2 x /day at [facility] & Bedtime for Heart burn & Reflux (Patient taking differently: Take 300 mg by mouth 2 (two) times daily. ) 180 tablet 3  . sucralfate (CARAFATE) 1 g tablet Take 1 tablet dissolved in water  4 x /day BEFORE Meals & Bedtime for Acid indigestion & Reflux (Patient taking differently: Take 1 g by mouth 4 (four) times daily -  with meals and at bedtime. Take 1 tablet dissolved in water  4 x /day BEFORE Meals & Bedtime for Acid indigestion & Reflux) 360 tablet 1  . traMADol (ULTRAM) 50 MG tablet Take 1 to 2 tablets by mouth 4 times a day as needed for pain. (Patient taking differently: Take 50-100 mg by mouth 4 (four) times daily as needed for moderate pain. Take 1 to 2 tablets by mouth 4 times a day as needed for pain.) 180 tablet 0  . umeclidinium-vilanterol (ANORO ELLIPTA) 62.5-25 MCG/INH AEPB Inhale 1 puff into the lungs daily as needed (shortness of breath). (Patient not taking: Reported on 03/03/2018) 1 each 1  . vitamin B-12 (CYANOCOBALAMIN) 1000 MCG tablet Take 1 tablet (1,000 mcg total) by mouth daily. (Patient taking differently: Take 2,000 mcg by mouth daily. ) 30 tablet 0    Review of Systems: A full ROS was attempted today and was not able to be performed due to global aphasia.  Physical Examination: Temp:  [98 F (36.7 C)-98.5 F (36.9 C)] 98.5 F (36.9 C) (02/26 0440) Pulse Rate:  [65-100] 65 (02/26 1520) Resp:  [15-18] 15 (02/26 1520) BP: (99-138)/(60-83) 138/83 (02/26 1520) SpO2:  [96 %-98 %] 96 % (02/26 1520) Weight:  [80.1  kg] 80.1 kg (02/26 0440)  General - well nourished, well developed, mildly sleepy.    Ophthalmologic - fundi not visualized due to noncooperation.    Cardiovascular - regular rate and rhythm  Neuro -  mildly sleepy, easily arousable, intangible sounds, global aphasia, only able to follow commands on eye closing and opening but no other commands.  Not able to name or repeat.  PERRL, left gaze preference, able to cross midline to the right, right hemianopia, not blinking to visual threat.  Significant right facial droop, tongue midline amounts.  Right upper extremity 0/5, right lower extremity mild withdraw to pain.  Left upper and lower extremity spontaneous movement against gravity.  DTR 1+, right Babinski positive. Sensation, coordination and gait not tested.  NIH Stroke Scale  Level Of Consciousness 0=Alert; keenly responsive 1=Not alert, but arousable by minor stimulation 2=Not alert, requires repeated stimulation 3=Responds only with reflex movements 1  LOC Questions to Month and Age 54=Answers both questions correctly 1=Answers one question correctly 2=Answers neither question correctly 2  LOC Commands      -Open/Close eyes     -Open/close grip 0=Performs both tasks correctly 1=Performs one task correctly 2=Performs neighter task correctly 1  Best Gaze 0=Normal 1=Partial gaze palsy 2=Forced deviation, or total gaze paresis 1  Visual 0=No visual loss 1=Partial hemianopia 2=Complete hemianopia 3=Bilateral hemianopia (blind including cortical blindness) 2  Facial Palsy 0=Normal symmetrical movement 1=Minor paralysis (asymmetry) 2=Partial paralysis (lower face) 3=Complete paralysis (upper and lower face) 2  Motor  0=No drift, limb holds posture for full 10 seconds 1=Drift, limb holds posture, no drift to bed 2=Some antigravity effort, cannot maintain posture, drifts to bed 3=No effort against gravity, limb falls 4=No movement Right Arm 4     Leg 3    Left Arm 0     Leg 0  Limb Ataxia 0=Absent 1=Present in one limb 2=Present in two limbs 0  Sensory 0=Normal 1=Mild to moderate sensory loss 2=Severe to total sensory loss 0  Best Language 0=No aphasia, normal 1=Mild to  moderate aphasia 2=Mute, global aphasia 3=Mute, global aphasia 2  Dysarthria 0=Normal 1=Mild to moderate 2=Severe, unintelligible or mute/anarthric 2  Extinction/Neglect 0=No abnormality 1=Extinction to bilateral simultaneous stimulation 2=Profound neglect 1  Total   21     Data Reviewed: Ct Abdomen Pelvis Wo Contrast  Result Date: 03/08/2018 CLINICAL DATA:  Large ulcerated gastric fundal mass found on upper endoscopy today. EXAM: CT CHEST, ABDOMEN AND PELVIS WITHOUT CONTRAST TECHNIQUE: Multidetector CT imaging of the chest, abdomen and pelvis was performed following the standard protocol without IV contrast. COMPARISON:  CT chest 01/20/2018 FINDINGS: CT CHEST FINDINGS Cardiovascular: The heart size is normal. No substantial pericardial effusion. Coronary artery calcification is evident. Atherosclerotic calcification is noted in the wall of the thoracic aorta. Right Port-A-Cath tip is positioned in the distal SVC near the junction with the RA. Mediastinum/Nodes: 12 mm short axis prevascular node seen on 17/3. Scattered small central mediastinal lymph nodes identified in the upper mediastinum. 12 mm short axis retrotracheal node visible on 16/3. No subcarinal or lower paraesophageal lymphadenopathy. No evidence for gross hilar lymphadenopathy although assessment is limited by the lack of intravenous contrast on today's study. There is no axillary lymphadenopathy. Lungs/Pleura: The central tracheobronchial airways are patent. 19 mm ring-like lesion in the right upper lobe is stable and may represent airway impaction with associated tree-in-bud nodularity. 6 mm medial right upper lobe nodule visible on 67/4. 5 mm right middle lobe nodule visible on 102/4, stable. 1.9  x 1.2 cm irregular opacity in the posterior left upper lobe is at the site of the 4.2 x 3.8 cm mass seen on PET-CT of 10/11/2015 and may reflect residual scarring. Small left pleural effusion evident. Musculoskeletal: No worrisome lytic or  sclerotic osseous abnormality. CT ABDOMEN PELVIS FINDINGS Hepatobiliary: No focal abnormality in the liver on this study without intravenous contrast. Gallbladder surgically absent. No intrahepatic or extrahepatic biliary dilation. Pancreas: No focal mass lesion. No dilatation of the main duct. No intraparenchymal cyst. No peripancreatic edema. Spleen: Subtle 2.7 x 2.0 cm hypoattenuating lesion is identified in the dome of spleen, not definitely seen on prior. Adrenals/Urinary Tract: Right adrenal gland unremarkable. 1.6 cm left adrenal nodule stable since PET-CT of 10/01/2015. Cortical scarring noted in both kidneys. No evidence for hydroureter. The urinary bladder appears normal for the degree of distention. Stomach/Bowel: Tiny hiatal hernia noted. Wall thickening is noted in the esophagogastric junction and proximal stomach. Wall thickening extends along the medial aspect of the proximal stomach. Distal stomach unremarkable. Duodenum is normally positioned as is the ligament of Treitz. No small bowel wall thickening. No small bowel dilatation. The terminal ileum is normal. The appendix is not visualized, but there is no edema or inflammation in the region of the cecum. No gross colonic mass. No colonic wall thickening. Vascular/Lymphatic: There is abdominal aortic atherosclerosis without aneurysm. Bulky lymphadenopathy is seen in the hepato duodenal ligament. 3.2 x 4.5 cm lymph node seen on 55/3. The 2.9 x 2.8 cm lymph node seen on 57/3. 10 mm short axis portal caval lymph node (63/3) is suspicious. No para-aortic retroperitoneal lymphadenopathy. No pelvic sidewall lymphadenopathy. Reproductive: The uterus is surgically absent. There is no adnexal mass. Other: No intraperitoneal free fluid. Musculoskeletal: No worrisome lytic or sclerotic osseous abnormality. IMPRESSION: 1. Marked wall thickening in the esophagogastric junction and proximal stomach, consistent with known neoplasm. Bulky lymphadenopathy in the  gastrohepatic ligament is compatible with metastatic disease. 10 mm short axis portal caval lymph node also suspicious. 2. 2.7 cm hypoattenuating lesion in the spleen. This is not well assessed on today's noncontrast exam. Close attention recommended as metastatic disease not excluded. 3. Scattered tiny pulmonary nodules measuring up to 5 mm. Nonspecific, but close attention recommended as metastatic disease not excluded. 4. Mild lymphadenopathy noted in the upper mediastinum. 5. Circular lesion right upper lobe on axial imaging has a branching configuration on coronal and sagittal images. This is probably airway impaction related to atypical infection. Close follow-up recommended. 6. 1.9 x 1.2 cm irregular lesion posterior left upper lobe at the site of a 4 cm mass on PET-CT of 10/11/2015. Imaging features today are compatible with residua of that lesion. 7. 1.6 cm left adrenal nodule stable since 10/11/2015, likely benign. 8.  Aortic Atherosclerois (ICD10-170.0) Electronically Signed   By: Misty Stanley M.D.   On: 03/08/2018 19:19   Ct Angio Head W Or Wo Contrast  Result Date: 03/12/2018 CLINICAL DATA:  Stroke, follow-up. Recent acute nonhemorrhagic infarcts involving the medial left occipital lobe and bilateral parietal lobes. Punctate acute infarct in the posterior left frontal lobe. EXAM: CT ANGIOGRAPHY HEAD AND NECK TECHNIQUE: Multidetector CT imaging of the head and neck was performed using the standard protocol during bolus administration of intravenous contrast. Multiplanar CT image reconstructions and MIPs were obtained to evaluate the vascular anatomy. Carotid stenosis measurements (when applicable) are obtained utilizing NASCET criteria, using the distal internal carotid diameter as the denominator. CONTRAST:  68m ISOVUE-370 IOPAMIDOL (ISOVUE-370) INJECTION 76% COMPARISON:  MRI of  the brain and MRA [place] 03/03/2018. CT head without contrast 03/12/2018. FINDINGS: CTA NECK FINDINGS Aortic  arch: A 3 vessel arch configuration is present. Atherosclerotic changes are noted at the origin of the left subclavian artery without significant stenosis. Additional calcifications are present in the distal arch. There is no aneurysm. Right carotid system: The right common carotid artery is tortuous proximally. Minimal atherosclerotic changes are noted at the right carotid bifurcation. There is no significant stenosis. There is mild tortuosity of the cervical right ICA without significant stenosis. Left carotid system: The left common carotid artery is mildly tortuous without significant stenosis. The bifurcation is unremarkable. There is moderate tortuosity of the more distal cervical left ICA without a significant stenosis. Vertebral arteries: The left vertebral artery is slightly dominant to the right. Both vertebral arteries originate from the subclavian arteries without significant stenosis. There is no significant stenosis or vascular injury to either vertebral artery in the neck. Skeleton: Levoconvex curvature is present in the lower cervical spine. Grade 1 degenerative anterolisthesis is present at C4-5. Uncovertebral disease is present bilaterally at C5-6 and C6-7. Other neck: The soft tissues of the neck demonstrate moderate atrophy of the thyroid. Salivary glands are within normal limits bilaterally. Mediastinal and thoracic inlet adenopathy again noted, recent described on CT of the chest. Upper chest: A ring-like lesion in the right upper lobe measures 18 mm, stable. No other focal nodules are visualized. Centrilobular emphysematous changes are present. Review of the MIP images confirms the above findings CTA HEAD FINDINGS Anterior circulation: Atherosclerotic calcifications are present within the cavernous internal carotid arteries bilaterally. There is no significant stenosis relative to the ICA termini. The A1 and M1 segments are normal. Anterior communicating artery is patent. MCA bifurcations are  intact bilaterally. There is a high-grade stenosis or nonocclusive thrombus involving the superior left M2 division proximally. Posterior circulation: Left vertebral artery is dominant. PICA origins are visualized and normal. Both posterior cerebral arteries originate from basilar tip. A left posterior communicating artery contributes. PCA branch vessels are within normal limits bilaterally. Venous sinuses: Dural sinuses are patent. Straight sinus deep cerebral veins are intact. Cortical veins are within normal limits. Anatomic variants: None Review of the MIP images confirms the above findings IMPRESSION: 1. High-grade stenosis versus nonocclusive thrombus involving the proximal left superior M2 division. 2. No other focal stenosis or occlusion. 3. No aneurysm. 4. Tortuosity of the cervical vasculature without significant stenosis. 5. Aortic Atherosclerosis (ICD10-I70.0). No aneurysm or stenosis at the aortic arch. Electronically Signed   By: San Morelle M.D.   On: 03/12/2018 15:37   Dg Chest 2 View  Result Date: 03/03/2018 CLINICAL DATA:  Weakness, dizziness and confusion over the last 2 weeks. History of renal cell cancer and myeloma. EXAM: CHEST - 2 VIEW COMPARISON:  02/26/2018 radiography.  CT 01/20/2018. FINDINGS: Heart size is normal. Chronic aortic atherosclerosis. Power port inserted from a right jugular approach has its tip at the SVC RA junction. The lungs are clear except for mild scarring. No sign of active infiltrate, mass, effusion or collapse. IMPRESSION: No active cardiopulmonary disease. Electronically Signed   By: Nelson Chimes M.D.   On: 03/03/2018 12:58   Ct Head Wo Contrast  Result Date: 03/05/2018 CLINICAL DATA:  Headache beginning this afternoon. History of multiple myeloma, thrombocytopenia and stroke. EXAM: CT HEAD WITHOUT CONTRAST TECHNIQUE: Contiguous axial images were obtained from the base of the skull through the vertex without intravenous contrast. COMPARISON:  MRI of  the head March 03, 2018 and  CT HEAD March 03, 2018. FINDINGS: BRAIN: Patchy LEFT > RIGHT parietal and LEFT occipital hypodensities are increasingly conspicuous from prior CT. No propagation. No intraparenchymal hemorrhage, mass effect or midline shift. Patchy LEFT frontal white matter hypodensities most compatible with chronic small vessel ischemic changes. Old small LEFT cerebellar infarct. No parenchymal brain volume loss for age. No hydrocephalus. No abnormal extra-axial fluid collections. VASCULAR: Moderate calcific atherosclerosis of the carotid siphons. SKULL: No skull fracture. Moderate temporomandibular osteoarthrosis. No significant scalp soft tissue swelling. SINUSES/ORBITS: Trace paranasal sinus mucosal thickening. Mastoid air cells are well aerated.The included ocular globes and orbital contents are non-suspicious. Status post bilateral ocular lens implants. OTHER: None. IMPRESSION: 1. Involving acute LEFT > RIGHT parietal and LEFT occipital lobe infarcts without hemorrhagic conversion. 2. Mild-to-moderate chronic small vessel ischemic changes. Old small LEFT cerebellar infarct. Electronically Signed   By: Elon Alas M.D.   On: 03/05/2018 17:25   Ct Head Wo Contrast  Result Date: 03/03/2018 CLINICAL DATA:  Confused. Altered level of consciousness. Breast cancer. EXAM: CT HEAD WITHOUT CONTRAST TECHNIQUE: Contiguous axial images were obtained from the base of the skull through the vertex without intravenous contrast. COMPARISON:  02/26/2018. FINDINGS: Brain: Two separate areas of ill-defined abnormal cortical and subcortical edema in the LEFT hemisphere, affecting the LEFT parietal and LEFT occipital lobes, could represent multifocal infarcts versus rapid progression of metastatic disease. These areas were previously unremarkable on CT 5 days earlier. MRI of the brain without and with contrast recommended for further evaluation. Elsewhere, mild to moderate atrophy. Hypoattenuation of white  matter, consistent with small vessel disease/post treatment effect. Vascular: Calcification of the cavernous internal carotid arteries consistent with cerebrovascular atherosclerotic disease. No signs of intracranial large vessel occlusion. Skull: Calvarium intact. Sinuses/Orbits: No sinus disease. Negative orbits. Other: None. IMPRESSION: Two separate areas of abnormal cortical and subcortical edema in the LEFT hemisphere, affecting the LEFT parietal and LEFT occipital lobes, could represent multifocal infarcts versus rapid progression of metastatic disease. These were not present on the previous CT from 02/26/2018. MRI of the brain without and with contrast recommended for further evaluation. Electronically Signed   By: Staci Righter M.D.   On: 03/03/2018 13:26   Ct Head Wo Contrast  Result Date: 02/26/2018 CLINICAL DATA:  Blackouts for 1 month. History of multiple myeloma. EXAM: CT HEAD WITHOUT CONTRAST TECHNIQUE: Contiguous axial images were obtained from the base of the skull through the vertex without intravenous contrast. COMPARISON:  12/22/2015. FINDINGS: Brain: No evidence for acute infarction, hemorrhage, mass lesion, hydrocephalus, or extra-axial fluid. Mild atrophy. Hypoattenuation of white matter, consistent with small vessel disease or post treatment effect. 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 Vascular: Calcification of the cavernous internal carotid arteries consistent with cerebrovascular atherosclerotic disease. No signs of intracranial large vessel occlusion. Skull: Calvarium intact. Small lucency in the RIGHT frontal bone (series 3 image 50) unchanged from 2017. Sinuses/Orbits: No acute finding. Other: None. IMPRESSION: Atrophy and small vessel disease. No acute intracranial findings. Electronically Signed   By: Staci Righter M.D.   On: 02/26/2018 18:23   Ct Angio Neck W Or Wo Contrast  Result Date: 03/12/2018 CLINICAL DATA:  Stroke, follow-up. Recent acute  nonhemorrhagic infarcts involving the medial left occipital lobe and bilateral parietal lobes. Punctate acute infarct in the posterior left frontal lobe. EXAM: CT ANGIOGRAPHY HEAD AND NECK TECHNIQUE: Multidetector CT imaging of the head and neck was performed using the standard protocol during bolus administration of intravenous contrast. Multiplanar CT image reconstructions and MIPs were  obtained to evaluate the vascular anatomy. Carotid stenosis measurements (when applicable) are obtained utilizing NASCET criteria, using the distal internal carotid diameter as the denominator. CONTRAST:  71m ISOVUE-370 IOPAMIDOL (ISOVUE-370) INJECTION 76% COMPARISON:  MRI of the brain and MRA circle-[place]-Willis 03/03/2018. CT head without contrast 03/12/2018. FINDINGS: CTA NECK FINDINGS Aortic arch: A 3 vessel arch configuration is present. Atherosclerotic changes are noted at the origin of the left subclavian artery without significant stenosis. Additional calcifications are present in the distal arch. There is no aneurysm. Right carotid system: The right common carotid artery is tortuous proximally. Minimal atherosclerotic changes are noted at the right carotid bifurcation. There is no significant stenosis. There is mild tortuosity of the cervical right ICA without significant stenosis. Left carotid system: The left common carotid artery is mildly tortuous without significant stenosis. The bifurcation is unremarkable. There is moderate tortuosity of the more distal cervical left ICA without a significant stenosis. Vertebral arteries: The left vertebral artery is slightly dominant to the right. Both vertebral arteries originate from the subclavian arteries without significant stenosis. There is no significant stenosis or vascular injury to either vertebral artery in the neck. Skeleton: Levoconvex curvature is present in the lower cervical spine. Grade 1 degenerative anterolisthesis is present at C4-5. Uncovertebral disease is  present bilaterally at C5-6 and C6-7. Other neck: The soft tissues of the neck demonstrate moderate atrophy of the thyroid. Salivary glands are within normal limits bilaterally. Mediastinal and thoracic inlet adenopathy again noted, recent described on CT of the chest. Upper chest: A ring-like lesion in the right upper lobe measures 18 mm, stable. No other focal nodules are visualized. Centrilobular emphysematous changes are present. Review of the MIP images confirms the above findings CTA HEAD FINDINGS Anterior circulation: Atherosclerotic calcifications are present within the cavernous internal carotid arteries bilaterally. There is no significant stenosis relative to the ICA termini. The A1 and M1 segments are normal. Anterior communicating artery is patent. MCA bifurcations are intact bilaterally. There is a high-grade stenosis or nonocclusive thrombus involving the superior left M2 division proximally. Posterior circulation: Left vertebral artery is dominant. PICA origins are visualized and normal. Both posterior cerebral arteries originate from basilar tip. A left posterior communicating artery contributes. PCA branch vessels are within normal limits bilaterally. Venous sinuses: Dural sinuses are patent. Straight sinus deep cerebral veins are intact. Cortical veins are within normal limits. Anatomic variants: None Review of the MIP images confirms the above findings IMPRESSION: 1. High-grade stenosis versus nonocclusive thrombus involving the proximal left superior M2 division. 2. No other focal stenosis or occlusion. 3. No aneurysm. 4. Tortuosity of the cervical vasculature without significant stenosis. 5. Aortic Atherosclerosis (ICD10-I70.0). No aneurysm or stenosis at the aortic arch. Electronically Signed   By: CSan MorelleM.D.   On: 03/12/2018 15:37   Ct Chest Wo Contrast  Result Date: 03/08/2018 CLINICAL DATA:  Large ulcerated gastric fundal mass found on upper endoscopy today. EXAM: CT  CHEST, ABDOMEN AND PELVIS WITHOUT CONTRAST TECHNIQUE: Multidetector CT imaging of the chest, abdomen and pelvis was performed following the standard protocol without IV contrast. COMPARISON:  CT chest 01/20/2018 FINDINGS: CT CHEST FINDINGS Cardiovascular: The heart size is normal. No substantial pericardial effusion. Coronary artery calcification is evident. Atherosclerotic calcification is noted in the wall of the thoracic aorta. Right Port-A-Cath tip is positioned in the distal SVC near the junction with the RA. Mediastinum/Nodes: 12 mm short axis prevascular node seen on 17/3. Scattered small central mediastinal lymph nodes identified in the upper mediastinum. 12  mm short axis retrotracheal node visible on 16/3. No subcarinal or lower paraesophageal lymphadenopathy. No evidence for gross hilar lymphadenopathy although assessment is limited by the lack of intravenous contrast on today's study. There is no axillary lymphadenopathy. Lungs/Pleura: The central tracheobronchial airways are patent. 19 mm ring-like lesion in the right upper lobe is stable and may represent airway impaction with associated tree-in-bud nodularity. 6 mm medial right upper lobe nodule visible on 67/4. 5 mm right middle lobe nodule visible on 102/4, stable. 1.9 x 1.2 cm irregular opacity in the posterior left upper lobe is at the site of the 4.2 x 3.8 cm mass seen on PET-CT of 10/11/2015 and may reflect residual scarring. Small left pleural effusion evident. Musculoskeletal: No worrisome lytic or sclerotic osseous abnormality. CT ABDOMEN PELVIS FINDINGS Hepatobiliary: No focal abnormality in the liver on this study without intravenous contrast. Gallbladder surgically absent. No intrahepatic or extrahepatic biliary dilation. Pancreas: No focal mass lesion. No dilatation of the main duct. No intraparenchymal cyst. No peripancreatic edema. Spleen: Subtle 2.7 x 2.0 cm hypoattenuating lesion is identified in the dome of spleen, not definitely seen  on prior. Adrenals/Urinary Tract: Right adrenal gland unremarkable. 1.6 cm left adrenal nodule stable since PET-CT of 10/01/2015. Cortical scarring noted in both kidneys. No evidence for hydroureter. The urinary bladder appears normal for the degree of distention. Stomach/Bowel: Tiny hiatal hernia noted. Wall thickening is noted in the esophagogastric junction and proximal stomach. Wall thickening extends along the medial aspect of the proximal stomach. Distal stomach unremarkable. Duodenum is normally positioned as is the ligament of Treitz. No small bowel wall thickening. No small bowel dilatation. The terminal ileum is normal. The appendix is not visualized, but there is no edema or inflammation in the region of the cecum. No gross colonic mass. No colonic wall thickening. Vascular/Lymphatic: There is abdominal aortic atherosclerosis without aneurysm. Bulky lymphadenopathy is seen in the hepato duodenal ligament. 3.2 x 4.5 cm lymph node seen on 55/3. The 2.9 x 2.8 cm lymph node seen on 57/3. 10 mm short axis portal caval lymph node (63/3) is suspicious. No para-aortic retroperitoneal lymphadenopathy. No pelvic sidewall lymphadenopathy. Reproductive: The uterus is surgically absent. There is no adnexal mass. Other: No intraperitoneal free fluid. Musculoskeletal: No worrisome lytic or sclerotic osseous abnormality. IMPRESSION: 1. Marked wall thickening in the esophagogastric junction and proximal stomach, consistent with known neoplasm. Bulky lymphadenopathy in the gastrohepatic ligament is compatible with metastatic disease. 10 mm short axis portal caval lymph node also suspicious. 2. 2.7 cm hypoattenuating lesion in the spleen. This is not well assessed on today's noncontrast exam. Close attention recommended as metastatic disease not excluded. 3. Scattered tiny pulmonary nodules measuring up to 5 mm. Nonspecific, but close attention recommended as metastatic disease not excluded. 4. Mild lymphadenopathy noted in  the upper mediastinum. 5. Circular lesion right upper lobe on axial imaging has a branching configuration on coronal and sagittal images. This is probably airway impaction related to atypical infection. Close follow-up recommended. 6. 1.9 x 1.2 cm irregular lesion posterior left upper lobe at the site of a 4 cm mass on PET-CT of 10/11/2015. Imaging features today are compatible with residua of that lesion. 7. 1.6 cm left adrenal nodule stable since 10/11/2015, likely benign. 8.  Aortic Atherosclerois (ICD10-170.0) Electronically Signed   By: Misty Stanley M.D.   On: 03/08/2018 19:19   Mr Brain Wo Contrast  Result Date: 03/03/2018 CLINICAL DATA:  Sudden onset confusion. EXAM: MRI HEAD WITHOUT CONTRAST TECHNIQUE: Multiplanar, multiecho pulse sequences  of the brain and surrounding structures were obtained without intravenous contrast. COMPARISON:  Head CT 03/03/2018 FINDINGS: The examination had to be discontinued prior to completion due to altered mental status and inability to follow the technologist's instructions. Coronal and axial diffusion-weighted imaging, sagittal T1-weighted imaging and axial T2-weighted imaging were obtained. There is multifocal abnormal diffusion restriction within both hemispheres, including the left frontal lobe, both parietal lobes, left occipital lobe and both cerebellar hemispheres. The largest area of diffusion abnormalities in the posterior left parietal lobe. There is no midline shift or other mass effect. The midline structures are normal. There is moderate white matter hyperintense T2-weighted signal consistent with chronic microvascular ischemia. IMPRESSION: 1. Examination discontinued early due to patient altered mental status. 2. Multifocal bilateral acute ischemia within multiple vascular territories in both hemispheres. This may indicate a central cardiac or aortic embolic source. Electronically Signed   By: Ulyses Jarred M.D.   On: 03/03/2018 15:04   Ir Ivc Filter Plmt  / S&i /img Guid/mod Sed  Result Date: 03/09/2018 INDICATION: Paradoxical embolus.  Calf vein DVT.  Pulmonary embolism. EXAM: IVC FILTER,INFERIOR VENA CAVOGRAM MEDICATIONS: None. ANESTHESIA/SEDATION: Fentanyl 75 mcg IV; Versed 1 mg IV Moderate Sedation Time:  17 minutes The patient was continuously monitored during the procedure by the interventional radiology nurse under my direct supervision. FLUOROSCOPY TIME:  Fluoroscopy Time: 3 minutes 30 seconds (31 mGy). COMPLICATIONS: None immediate. PROCEDURE: Informed written consent was obtained from the patient after a thorough discussion of the procedural risks, benefits and alternatives. All questions were addressed. Maximal Sterile Barrier Technique was utilized including caps, mask, sterile gowns, sterile gloves, sterile drape, hand hygiene and skin antiseptic. A timeout was performed prior to the initiation of the procedure. The right neck was prepped with Betadine in a sterile fashion, and a sterile drape was applied covering the operative field. A sterile gown and sterile gloves were used for the procedure. The right jugular vein was noted to be patent initially with ultrasound. Under sonographic guidance, a micropuncture needle was inserted into the right jugular vein (Ultrasound image documentation was performed). It was removed over an 018 wire which was up-sized to a Rankin. The sheath was inserted over the wire and into the IVC. IVC carbon dioxide venography was performed. The temporary Denali filter was then deployed in the infrarenal IVC. The sheath was removed and hemostasis was achieved with direct pressure. FINDINGS: IVC venography demonstrates renal inflow at the mid L2 level. There is no venous anomaly. There is no evidence of mega cava. Findings were corroborated with the accompanying CT abdomen. The final image demonstrates a deployed IVC filter with its tip at the lower L2 endplate. IMPRESSION: Successful infrarenal IVC filter placement. This is a  temporary filter. It can be removed or remain in place to become permanent. PLAN: This IVC filter is potentially retrievable. The patient will be assessed for filter retrieval by Interventional Radiology in approximately 8-12 weeks. Further recommendations regarding filter retrieval, continued surveillance or declaration of device permanence, will be made at that time. Electronically Signed   By: Marybelle Killings M.D.   On: 03/09/2018 12:15   Dg Abd Acute 2+v W 1v Chest  Result Date: 02/26/2018 CLINICAL DATA:  Lapse in memory. EXAM: DG ABDOMEN ACUTE W/ 1V CHEST COMPARISON:  None. FINDINGS: There is no evidence of dilated bowel loops or free intraperitoneal air. No radiopaque calculi or other significant radiographic abnormality is seen. Heart size and mediastinal contours are within normal limits. Both lungs are clear. Calcified  tortuous aorta. Degenerative RIGHT SI joint. Port-A-Cath, RIGHT chest subclavian approach, tip RIGHT atrium. IMPRESSION: Negative abdominal radiographs. No acute cardiopulmonary disease. Electronically Signed   By: Staci Righter M.D.   On: 02/26/2018 18:16   Dg Swallowing Func-speech Pathology  Result Date: 03/06/2018 Objective Swallowing Evaluation: Type of Study: MBS-Modified Barium Swallow Study  Patient Details Name: LAKEYN DOKKEN MRN: [id number] Date of Birth: 1940/05/05 Today's Date: 03/06/2018 Time: SLP Start Time (ACUTE ONLY): 0825 -SLP Stop Time (ACUTE ONLY): 0840 SLP Time Calculation (min) (ACUTE ONLY): 15 min Past Medical History: Past Medical History: Diagnosis Date . Anemia  . Anginal pain ([place])   used NTG x 2 May 31 and 06/15/13  . Anxiety  . Arthritis  . B12 deficiency 12/04/2014 . Breast cancer ([place])  . Complication of anesthesia  . COPD (chronic obstructive pulmonary disease) ([place])  . 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 Depression  . Dizziness  . Dyspnea  . Fibromyalgia  . Fibromyalgia  . GERD (gastroesophageal reflux disease)  . Headache(784.0)  . Heart murmur  .  Hemoptysis 10/21/2015 . History of blood transfusion   last one May 12  . Hx of cardiovascular stress test   LexiScan with low level exercise Myoview (02/2013): No ischemia, EF 72%; normal study . Hx of echocardiogram   a.  Echocardiogram (12/26/2012): EF 24-23%, grade 1 diastolic dysfunction;   b.  Echocardiogram (02/2013): EF 55-60%, no WMA, trivial effusion . Hyperkalemia  . Hyponatremia  . Hypotension  . Hypothyroidism  . Mucositis  . Multiple myeloma  . Myocardial infarction [facility])   in past, patient was unaware.  . Neuropathy  . Nodule of left lung 09/13/2015 . Pain in joint, pelvic region and thigh 07/07/2015 . Pneumonia   several . PONV (postoperative nausea and vomiting) 2008  after mastestomy Past Surgical History: Past Surgical History: Procedure Laterality Date . ABDOMINAL HYSTERECTOMY  1981 . AV FISTULA PLACEMENT Left 06/19/2013  Procedure: CREATION OF LEFT ARM ARTERIOVENOUS (AV) FISTULA ;  Surgeon: Angelia Mould, MD;  Location: [place];  Service: Vascular;  Laterality: Left; . BREAST RECONSTRUCTION   . BREAST SURGERY Right   reduction . CATARACT EXTRACTION, BILATERAL   . CHOLECYSTECTOMY  1971 . COLONOSCOPY   . EYE SURGERY Bilateral   lens implant . history of Port removal   . LUNG BIOPSY  10/21/2015 . MASTECTOMY Left 2008 . PORTACATH PLACEMENT  12/2012  has had 2 . RESECTION OF ARTERIOVENOUS FISTULA ANEURYSM Left 06/04/2017  Procedure: LIGATION ANEURYSM OF LEFT ARTERIOVENOUS FISTULA;  Surgeon: Angelia Mould, MD;  Location: [place];  Service: Vascular;  Laterality: Left; . Status post stem cell transplant on September 28, 2008.   HPI: Patient is a 78 y/o female presents with confusion, amnesia and HA. Head CT- left occipital and left frontal parietial infarcts. MRI- multiple posterior/anterior bilateral infarcts. PMH includes multiple myeloma currently getting chemo, MI, COPD, hypotension, breast ca, fibromyalgia.  Subjective: pleasant Assessment / Plan / Recommendation CH[facility] IP CLINICAL IMPRESSIONS  03/06/2018 Clinical Impression Pt demonstrated reduced lingual retraction which resulted in mild vallecular residue and transient penetration (PAS 2) with thin liquids when consecutive swallows were used. [name] independent use of dry secondary swallows was effective in reducing the residue and residue was eliminated with use of aliquid wash. Overall, her swallow mechanism appears to be within functional limits with consideration of her age and she does not appear to be at significant risk of aspiration at this time. It is recommended that a regular texture diet be continued  but, with consideration of her reported xerostomia, liquid washes may be beneficial to improve bolus flow. SLP will continue to follow for cognitive-linguisitic treatment and will see the pt once more for swallowing to ensure her observance of compensatory strategies. SLP Visit Diagnosis Dysphagia, unspecified (R13.10) Attention and concentration deficit following -- Frontal lobe and executive function deficit following -- Impact on safety and function No limitations   CH[facility] IP TREATMENT RECOMMENDATION 03/06/2018 Treatment Recommendations Therapy as outlined in treatment plan below   Prognosis 03/06/2018 Prognosis for Safe Diet Advancement Good Barriers to Reach Goals Cognitive deficits Barriers/Prognosis Comment -- [facility] IP DIET RECOMMENDATION 03/06/2018 SLP Diet Recommendations Regular solids;Thin liquid Liquid Administration via Cup;Straw Medication Administration Whole meds with liquid Compensations Small sips/bites;Slow rate;Follow solids with liquid Postural Changes Remain semi-upright after after feeds/meals (Comment);Seated upright at 90 degrees   No flowsheet data found.  CHL IP FOLLOW UP RECOMMENDATIONS 03/06/2018 Follow up Recommendations Inpatient Rehab   CHL IP FREQUENCY AND DURATION 03/06/2018 Speech Therapy Frequency (ACUTE ONLY) min 2x/week Treatment Duration 2 weeks      CHL IP ORAL PHASE 03/06/2018 Oral Phase WFL Oral - Pudding Teaspoon --  Oral - Pudding Cup -- Oral - Honey Teaspoon -- Oral - Honey Cup -- Oral - Nectar Teaspoon -- Oral - Nectar Cup -- Oral - Nectar Straw -- Oral - Thin Teaspoon -- Oral - Thin Cup -- Oral - Thin Straw -- Oral - Puree -- Oral - Mech Soft -- Oral - Regular -- Oral - Multi-Consistency -- Oral - Pill -- Oral Phase - Comment --  CHL IP PHARYNGEAL PHASE 03/06/2018 Pharyngeal Phase WFL Pharyngeal- Pudding Teaspoon -- Pharyngeal -- Pharyngeal- Pudding Cup -- Pharyngeal -- Pharyngeal- Honey Teaspoon -- Pharyngeal -- Pharyngeal- Honey Cup -- Pharyngeal -- Pharyngeal- Nectar Teaspoon -- Pharyngeal -- Pharyngeal- Nectar Cup -- Pharyngeal -- Pharyngeal- Nectar Straw -- Pharyngeal -- Pharyngeal- Thin Teaspoon -- Pharyngeal -- Pharyngeal- Thin Cup Penetration/Aspiration during swallow Pharyngeal Material enters airway, remains ABOVE vocal cords then ejected out Pharyngeal- Thin Straw Penetration/Aspiration during swallow Pharyngeal Material enters airway, remains ABOVE vocal cords then ejected out Pharyngeal- Puree Penetration/Aspiration before swallow;Penetration/Aspiration during swallow;Pharyngeal residue - valleculae Pharyngeal Material enters airway, remains ABOVE vocal cords then ejected out Pharyngeal- Mechanical Soft -- Pharyngeal -- Pharyngeal- Regular Pharyngeal residue - valleculae Pharyngeal -- Pharyngeal- Multi-consistency -- Pharyngeal -- Pharyngeal- Pill -- Pharyngeal -- Pharyngeal Comment --  Shanika I. Hardin Negus, [place], [place] Office number [phone] Pager Bryan 03/06/2018, 9:42 AM              Mr Virgel Paling Wo Contrast  Result Date: 03/03/2018 CLINICAL DATA:  Stroke follow-up EXAM: MRA HEAD WITHOUT CONTRAST TECHNIQUE: Angiographic images of the Circle of Willis were obtained using MRA technique without intravenous contrast. COMPARISON:  Brain MRI 03/03/2018 FINDINGS: POSTERIOR CIRCULATION: --Basilar artery: Normal. --Posterior cerebral arteries: Normal. Both  originate from the basilar artery. --Superior cerebellar arteries: Normal. --Inferior cerebellar arteries: Normal anterior and posterior inferior cerebellar arteries. ANTERIOR CIRCULATION: --Intracranial internal carotid arteries: Normal. --Anterior cerebral arteries: Normal. Both A1 segments are present. Patent anterior communicating artery. --Middle cerebral arteries: Normal. --Posterior communicating arteries: Absent bilaterally. IMPRESSION: Normal intracranial MRA. Electronically Signed   By: Ulyses Jarred M.D.   On: 03/03/2018 22:51   Ct Head Code Stroke Wo Contrast`  Result Date: 03/12/2018 CLINICAL DATA:  Code stroke.  Slurred speech. EXAM: CT HEAD WITHOUT CONTRAST TECHNIQUE: Contiguous axial images were obtained from the base of the skull through the vertex without  intravenous contrast. COMPARISON:  Head CT 03/05/2018 and MRI 03/03/2018 FINDINGS: Brain: Evolving subacute infarcts are again seen, small to moderate in size in the left parietal lobe and small in the left occipital lobe without evidence of interval infarct extension. Small right parietal and cerebellar infarcts on MRI are not well demonstrated. A small chronic left cerebellar infarct is again noted. No new infarct, intracranial hemorrhage, mass, midline shift, or extra-axial fluid collection is identified. The ventricles and sulci are within normal limits for age. There is mild chronic small vessel ischemic disease affecting the cerebral white matter bilaterally. Vascular: Calcified atherosclerosis at the skull base. No hyperdense vessel. Skull: No fracture or focal osseous lesion. Sinuses/Orbits: Visualized paranasal sinuses and mastoid air cells are clear. Bilateral cataract extraction is noted. Other: None. ASPECTS [facility] Stroke Program Early CT Score) Not scored given recent evolving infarcts. IMPRESSION: Evolving subacute infarcts as above without evidence of new intracranial abnormality. These results were communicated to Dr. Erlinda Hong at  2:53 pm on 03/12/2018 by text page via the [facility] messaging system. Electronically Signed   By: Logan Bores M.D.   On: 03/12/2018 14:53   Vas US Carotid (at [place] Only)  Result Date: 03/06/2018 Carotid Arterial Duplex Study Indications: CVA. Performing Technologist: Oliver Hum RVT  Examination Guidelines: A complete evaluation includes B-mode imaging, spectral Doppler, color Doppler, and power Doppler as needed of all accessible portions of each vessel. Bilateral testing is considered an integral part of a complete examination. Limited examinations for reoccurring indications may be performed as noted.  Right Carotid Findings: +----------+--------+-------+--------+----------------------+------------------+           PSV cm/sEDV    StenosisDescribe              Comments                             cm/s                                                    +----------+--------+-------+--------+----------------------+------------------+ CCA Prox  68      18                                   intimal thickening +----------+--------+-------+--------+----------------------+------------------+ CCA Distal77      20                                   intimal thickening +----------+--------+-------+--------+----------------------+------------------+ ICA Prox  72      21             smooth and                                                                heterogenous                             +----------+--------+-------+--------+----------------------+------------------+ ICA Distal70      27                                                      +----------+--------+-------+--------+----------------------+------------------+  ECA       63      13                                                      +----------+--------+-------+--------+----------------------+------------------+ +----------+--------+-------+--------+-------------------+           PSV cm/sEDV  cmsDescribeArm Pressure (mmHG) +----------+--------+-------+--------+-------------------+ Subclavian69                                         +----------+--------+-------+--------+-------------------+ +---------+--------+--+--------+-+---------+ VertebralPSV cm/s31EDV cm/s9Antegrade +---------+--------+--+--------+-+---------+  Left Carotid Findings: +----------+--------+--------+--------+-----------------------+--------+           PSV cm/sEDV cm/sStenosisDescribe               Comments +----------+--------+--------+--------+-----------------------+--------+ CCA Prox  82      23              smooth and heterogenous         +----------+--------+--------+--------+-----------------------+--------+ CCA Distal64      19              smooth and heterogenous         +----------+--------+--------+--------+-----------------------+--------+ ICA Prox  66      20              smooth and heterogenous         +----------+--------+--------+--------+-----------------------+--------+ ICA Distal81      32                                              +----------+--------+--------+--------+-----------------------+--------+ ECA       70      17                                              +----------+--------+--------+--------+-----------------------+--------+ +----------+--------+--------+--------+-------------------+ SubclavianPSV cm/sEDV cm/sDescribeArm Pressure (mmHG) +----------+--------+--------+--------+-------------------+           96                                          +----------+--------+--------+--------+-------------------+ +---------+--------+--+--------+--+---------+ VertebralPSV cm/s54EDV cm/s19Antegrade +---------+--------+--+--------+--+---------+  Summary: Right Carotid: Velocities in the right ICA are consistent with a 1-39% stenosis. Left Carotid: Velocities in the left ICA are consistent with a 1-39% stenosis. Vertebrals: Bilateral  vertebral arteries demonstrate antegrade flow. *See table(s) above for measurements and observations.  Electronically signed by Antony Contras MD on 03/06/2018 at 11:57:28 AM.    Final    Vas [place] Lower Extremity Venous (dvt)  Result Date: 03/07/2018  Lower Venous Study Other Indications: Stroke with PFO. Performing Technologist: June Leap RDMS, RVT  Examination Guidelines: A complete evaluation includes B-mode imaging, spectral Doppler, color Doppler, and power Doppler as needed of all accessible portions of each vessel. Bilateral testing is considered an integral part of a complete examination. Limited examinations for reoccurring indications may be performed as noted.  Right Venous Findings: +---------+---------------+---------+-----------+----------+-------------------+          CompressibilityPhasicitySpontaneityPropertiesSummary             +---------+---------------+---------+-----------+----------+-------------------+  CFV      Full           Yes      Yes                                      +---------+---------------+---------+-----------+----------+-------------------+ SFJ      Full                                                             +---------+---------------+---------+-----------+----------+-------------------+ FV Prox  Full                                                             +---------+---------------+---------+-----------+----------+-------------------+ FV Mid   Full                                                             +---------+---------------+---------+-----------+----------+-------------------+ FV DistalFull                                                             +---------+---------------+---------+-----------+----------+-------------------+ PFV      Full                                                             +---------+---------------+---------+-----------+----------+-------------------+ POP      Full            Yes      Yes                                      +---------+---------------+---------+-----------+----------+-------------------+ PTV      Full                                         not well visualized +---------+---------------+---------+-----------+----------+-------------------+ PERO     Full                                         not well visualized +---------+---------------+---------+-----------+----------+-------------------+  Left Venous Findings: +---------+---------------+---------+-----------+----------+------------------+          CompressibilityPhasicitySpontaneityPropertiesSummary            +---------+---------------+---------+-----------+----------+------------------+ CFV      Full  Yes      Yes                                     +---------+---------------+---------+-----------+----------+------------------+ SFJ      Full                                                            +---------+---------------+---------+-----------+----------+------------------+ FV Prox  Full                                                            +---------+---------------+---------+-----------+----------+------------------+ FV Mid   Full                                                            +---------+---------------+---------+-----------+----------+------------------+ FV DistalFull                                                            +---------+---------------+---------+-----------+----------+------------------+ PFV      Full                                                            +---------+---------------+---------+-----------+----------+------------------+ POP      Full           Yes      Yes                                     +---------+---------------+---------+-----------+----------+------------------+ PTV      None                                         isolated proximal                                                         calf               +---------+---------------+---------+-----------+----------+------------------+ PERO     None                                         isolated proximal  calf               +---------+---------------+---------+-----------+----------+------------------+    Summary: Right: There is no evidence of deep vein thrombosis in the lower extremity. No cystic structure found in the popliteal fossa. Left: Findings consistent with acute deep vein thrombosis involving the left posterior tibial vein, and left peroneal vein. No cystic structure found in the popliteal fossa.  *See table(s) above for measurements and observations. Electronically signed by Deitra Mayo MD on 03/07/2018 at 7:51:58 PM.    Final    Dg Esophagus W Single Cm (sol Or Thin Ba)  Result Date: 03/06/2018 CLINICAL DATA:  Dysphagia EXAM: ESOPHOGRAM/BARIUM SWALLOW TECHNIQUE: Single contrast examination was performed using thick and thin barium FLUOROSCOPY TIME:  Fluoroscopy Time:  1 minutes, 48 seconds Radiation Exposure Index (if provided by the fluoroscopic device): 17.9 mGy Number of Acquired Spot Images: None COMPARISON:  CT chest from 01/20/2018 FINDINGS: The patient was relatively frail, and I elected not to give gas crystals. With patient laying down, swallows were performed in the LPO position. Mild distal esophageal fold thickening. No obvious ulceration. Mildly dilated esophagus. Primary peristaltic waves in the esophagus were disrupted on 4/4 swallows in the mid esophagus level. Initial swallows demonstrate some lobularity and irregularity along the upper margin of the gastroesophageal junction for example on image 35/1. Tumor or ulceration not readily excluded given this appearance. In the LPO position, there is poor filling of the gastroesophageal junction, with only slow percolated shin of contrast from the dilated  esophagus into the stomach. A 13 mm barium tablet passed without difficulty into the stomach. Limited assessment of the upper stomach demonstrates no appreciable gastric diverticulum along the right margin of the upper stomach, raising concern that the rounded density in this vicinity on the recent chest CT from 01/20/2018 measuring 3.6 by 2.6 cm on that exam probably represents gastrohepatic ligament pathologic adenopathy rather than a diverticulum. IMPRESSION: 1. Nonspecific esophageal dysmotility disorder with disruption of primary peristaltic waves in the mid esophagus. 2. Distal esophageal irregularity potentially with ulceration. Difficult to exclude tumor of the distal esophagus. Endoscopy is recommended. 3. 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 Electronically Signed   By: Van Clines M.D.   On: 03/06/2018 09:31    Assessment: 78 y.o. female with PMH of remote breast cancer, COPD, CKD stage IV on chemo therapy, MI/CAD, multiple myeloma in remission, recently discharged [facility] to CIR for bilateral cortical and subcortical infarcts. She also had right LE DVT and had IVC filter placed. In CIR she was found to have a large fungating ulcerated proximal gastric mass suspicious for malignancy. Today in CIR around 1405 pt was found to have left gaze preference, right hemianopia, right facial droop and right hemiplegia. NIHSS = 21. CT no acute change but subacute infarcts. CTA head and neck showed left M2 high grade stenosis vs. Near occlusive thrombus. Pt was sent to IR for emergent mechanical thrombectomy. Her platelet 58 and was treated with platelet transfusion in preparation of procedure.   Plan: - likely hypercoagulable state causing stroke and DVT  without afib found so far - DNR status but rescinded for the procedure - once procedure done, she will be extubated as able and DNR status will resume - pt has past hx  of iodine allergy received solumedrol '250mg'$  and benadryl '50mg'$  IV - pt will receive 1 unit of radiated platelet (pt has special requirement for blood product) - IR for emergent mechanical thrombectomy - MRI, MRA  of the brain without contrast  - hold off antiplatelet at this time due to low platelet - admit to ICU after IR procedure  - BP goal 120-140 post procedure if recanalized  - close neuro check - Prophylactic therapy- None, recent DVT with IVC filter placed - PT consult, OT consult, Speech consult - Telemetry monitoring  Rosalin Hawking, MD PhD Stroke Neurology 03/12/2018 5:43 PM   This patient is critically ill due to acute stroke with left M2 occlusion, thrombocytopenia, DVT s/p IVC filter, possible malignant metastasis and at significant risk of neurological worsening, death form recurrent stroke, hemorrhagic conversion, PE, bleeding, seizure, heart failure. This patient's care requires constant monitoring of vital signs, hemodynamics, respiratory and cardiac monitoring, review of multiple databases, neurological assessment, discussion with family, other specialists and medical decision making of high complexity. I spent 55 minutes of neurocritical care time in the care of this patient. I had long discussion with daughter at bedside, updated pt current condition, treatment plan and potential prognosis. She expressed understanding and appreciation. I also discussed with Dr. Estanislado Pandy.

## 2018-03-12 NOTE — Evaluation (Signed)
Occupational Therapy Assessment and Plan  Patient Details  Name: Nancy Norris MRN: [id number] Date of Birth: [birth year]  OT Diagnosis: cognitive deficits, hemiplegia affecting dominant side and muscle weakness (generalized) Rehab Potential: Rehab Potential (ACUTE ONLY): Good ELOS: 5-7 days   Today's Date: 03/12/2018 OT Individual Time: 7829-5621 OT Individual Time Calculation (min): 57 min     Problem List:  Patient Active Problem List   Diagnosis Date Noted  . Subcortical infarction ([place]) 03/11/2018  . Pancytopenia ([place])   . NSTEMI (non-ST elevated myocardial infarction) ([place])   . Dyslipidemia   . Hypotension   . Acute DVT (deep venous thrombosis) ([place])   . Malignant neoplasm of stomach ([place])   . Acute cardioembolic stroke ([place])   . CKD (chronic kidney disease), stage III ([place])   . Dysphagia   . CKD (chronic kidney disease), stage IV ([place])   . Stroke ([place]) 03/03/2018  . Port catheter in place 09/18/2016  . Cryptococcal pneumonitis ([place]) 11/22/2015  . Symptomatic anemia 03/31/2015  . B12 deficiency 12/04/2014  . Thrombocytopenia ([place]) 12/04/2014  . Encounter for antineoplastic chemotherapy 06/21/2014  . CKD (chronic kidney disease) stage 5, GFR less than 15 ml/min (HCC) 04/02/2014  . Multiple myeloma ([place]) 12/02/2013  . Major depressive disorder, recurrent episode ([place]) 11/02/2013  . Medication management 09/30/2013  . Vitamin D deficiency 03/24/2013  . Mixed hyperlipidemia 02/24/2013  . COPD (chronic obstructive pulmonary disease) with emphysema ([place]) 02/20/2013  . GERD (gastroesophageal reflux disease) 01/20/2013  . HTN (hypertension) 01/14/2013  . Asthma, chronic 01/13/2013  . Hypothyroid 01/13/2013  . Chronic diastolic heart failure ([place]) 01/13/2013  . HX: breast cancer 12/08/2012  . Hypercalcemia 12/02/2012    Past Medical History:  Past Medical History:  Diagnosis Date  . Anemia   . Anginal pain ([place])    used NTG x 2 May 31 and 06/15/13   . Anxiety   .  Arthritis   . B12 deficiency 12/04/2014  . Breast cancer ([place])   . Complication of anesthesia   . COPD (chronic obstructive pulmonary disease) ([place])   . Cryptococcal pneumonitis ([place]) 11/22/2015  . Depression   . Dizziness   . Dyspnea   . Fibromyalgia   . Fibromyalgia   . GERD (gastroesophageal reflux disease)   . Headache(784.0)   . Heart murmur   . Hemoptysis 10/21/2015  . History of blood transfusion    last one May 12   . Hx of cardiovascular stress test    LexiScan with low level exercise Myoview (02/2013): No ischemia, EF 72%; normal study  . Hx of echocardiogram    a.  Echocardiogram (12/26/2012): EF 30-86%, grade 1 diastolic dysfunction;   b.  Echocardiogram (02/2013): EF 55-60%, no WMA, trivial effusion  . Hyperkalemia   . Hyponatremia   . Hypotension   . Hypothyroidism   . Mucositis   . Multiple myeloma   . Myocardial infarction [facility])    in past, patient was unaware.   . Neuropathy   . Nodule of left lung 09/13/2015  . Pain in joint, pelvic region and thigh 07/07/2015  . Pneumonia    several  . PONV (postoperative nausea and vomiting) 2008   after mastestomy   Past Surgical History:  Past Surgical History:  Procedure Laterality Date  . ABDOMINAL HYSTERECTOMY  1981  . AV FISTULA PLACEMENT Left 06/19/2013   Procedure: CREATION OF LEFT ARM ARTERIOVENOUS (AV) FISTULA ;  Surgeon: Angelia Mould, MD;  Location: [facility];  Service: Vascular;  Laterality: Left;  .  BIOPSY  03/08/2018   Procedure: BIOPSY;  Surgeon: Wilford Corner, MD;  Location: [facility];  Service: Endoscopy;;  . BREAST RECONSTRUCTION    . BREAST SURGERY Right    reduction  . CATARACT EXTRACTION, BILATERAL    . CHOLECYSTECTOMY  1971  . COLONOSCOPY    . ESOPHAGOGASTRODUODENOSCOPY (EGD) WITH PROPOFOL N/A 03/08/2018   Procedure: ESOPHAGOGASTRODUODENOSCOPY (EGD) WITH PROPOFOL;  Surgeon: Wilford Corner, MD;  Location: [facility];  Service: Endoscopy;  Laterality: N/A;  . EYE SURGERY Bilateral     lens implant  . history of Port removal    . IR IVC FILTER PLMT / S&I /IMG GUID/MOD SED  03/09/2018  . LUNG BIOPSY  10/21/2015  . MASTECTOMY Left 2008  . PORTACATH PLACEMENT  12/2012   has had 2  . RESECTION OF ARTERIOVENOUS FISTULA ANEURYSM Left 06/04/2017   Procedure: LIGATION ANEURYSM OF LEFT ARTERIOVENOUS FISTULA;  Surgeon: Angelia Mould, MD;  Location: [facility];  Service: Vascular;  Laterality: Left;  . Status post stem cell transplant on September 28, 2008.    . TEE WITHOUT CARDIOVERSION N/A 03/07/2018   Procedure: TRANSESOPHAGEAL ECHOCARDIOGRAM (TEE);  Surgeon: Buford Dresser, MD;  Location: [facility] ENDOSCOPY;  Service: Cardiovascular;  Laterality: N/A;    Assessment & Plan Clinical Impression: Patient is a 78 y.o. right-handed female with past medical history of multiple myeloma in remission on treatment with systemic chemotherapy and followed by Dr. Earlie Server, remote breast cancer, COPD with remote tobacco abuse 13 years ago, chronic anemia/thrombocytopenia, fibromyalgia, hypertension,CKD stage IV with creatinine 2.29-2.93. Per chart review and patient, patient lives alone and reportedly independent prior to admission. One level home with ramped entrance. Presented 03/03/2018 with altered mental status,dysphagia, headache 2 weeks, lactic acid 1.2, elevated ammonia level. She was recently seen in the ED on 02/26/2018 for episodes of dizziness and headache as well as a fall. CT of the head was negative. Noted hemoglobin 7.9 with noted history of chronic anemia, urine culture greater than 100,000 Escherichia coli she was discharged to home after being placed on Keflex. Upon this admission cranial CT scan repeated showing 2 separate areas of abnormal cortical and subcortical edema in the left hemisphere affecting the left parietal lobe occipital lobes that were not present on 02/26/2018. MRI the brain reviewed showing left greater than right small to moderate sized multifocal infarcts.   Per report, acute ischemia within multiple vascular territories in both hemispheres. MRA unremarkable. Patient did not receive TPA. Echocardiogram 03/05/2018 showed ejection fraction 65%.  Normal systolic function. Carotid Dopplers with no ICA stenosis. Blood cultures positive for staph species and Proteus started on vancomycin and ceftriaxone 03/06/2018 swallow study completed as well as esophagram 03/06/2018 showing nonspecific esophageal dysmotility no appreciable upper gastric diverticulum and currently maintained on a regular diet. TEE completed 03/07/2018 with ejection fraction 65% and no thrombus or vegetation. Small PFO seen by color Doppler. Patient remained on aspirin for CVA prophylaxis. Gastroenterology consulted Dr. Penelope Coop for evaluation of noted abnormal barium swallow, anemia question GI bleed. An upper GI endoscopy completed 03/08/2020 per Dr.Schooler that showed a large fungating malignant appearing ulcerated proximal stomach mass that had areas of blood seen consistent with recent bleeding and biopsies taken. Mass likely causing partial obstruction with her by mouth intake. Recommendations were for CT of abdomen and pelvis completed showing marked wall thickening in the esophagogastric junction and proximal stomach consistent with known neoplasm. Bulky lymphadenopathy in the gastrohepatic ligament compatible with metastatic disease. There was a 2.7 cm hypoattenuating lesion in the spleen  as well as scattered tiny pulmonary nodules measuring up to 5 mm and mild lymphadenopathy noted in the upper mediastinum. 1.9 x 1.2 cm irregular lesion posterior left upper lobe at the site of a 4 cm mass that was also noted on PET scan 10/11/2015 features compatible with residual of that lesion. Oncology services Dr. Earlie Server follow-up in regards to findings with pathology pending and current plan is to await biopsy report from gastric mass to give further recommendations about management suspicious malignancy.  Hospital course complicated by findings of acute DVT left posterior tibial vein, and left peroneal vein with radiology services consulted 03/09/2018 and underwent IVC filter placement per Dr. Barbie Banner of interventional radiology. On 03/10/2018 cardiology again follow-up for nonspecific chest discomfort and dizziness with elevated troponin 2.15-2.43.EKG did show new Q waves on her ECG in the inferior leads.  Repeat echocardiogram showing ejection fraction of 78% normal systolic function. The right ventricle normal systolic function. No increase in right ventricular wall thickness. It was discussed possible need for cardiac catheterization and patient not interested at this time. Patient is currently on a clear liquid diet advance as tolerated. Hemoglobin holding stable at 8.6.   Patient transferred to CIR on 03/11/2018 .    Patient currently requires Contact guard - supervision with basic self-care skills secondary to muscle weakness, decreased cardiorespiratoy endurance, unbalanced muscle activation, decreased coordination and decreased motor planning, decreased awareness, decreased problem solving, decreased safety awareness and decreased memory and decreased standing balance and decreased balance strategies.  Prior to hospitalization, patient could complete ADLs and IADLs with independent .  Patient will benefit from skilled intervention to increase independence with basic self-care skills and increase level of independence with iADL prior to discharge home independently.  Anticipate patient will require intermittent supervision and follow up outpatient.  OT - End of Session Activity Tolerance: Tolerates 30+ min activity without fatigue Endurance Deficit: No OT Assessment Rehab Potential (ACUTE ONLY): Good OT Barriers to Discharge: Decreased caregiver support OT Patient demonstrates impairments in the following area(s): Balance;Cognition;Endurance;Motor;Pain;Safety OT Basic ADL's Functional Problem(s):  Eating;Grooming;Bathing;Dressing;Toileting OT Advanced ADL's Functional Problem(s): Simple Meal Preparation OT Transfers Functional Problem(s): Toilet;Tub/Shower OT Additional Impairment(s): Fuctional Use of Upper Extremity OT Plan OT Intensity: Minimum of 1-2 x/day, 45 to 90 minutes OT Frequency: 5 out of 7 days OT Duration/Estimated Length of Stay: 5-7 days OT Treatment/Interventions: Balance/vestibular training;Cognitive remediation/compensation;Community reintegration;Discharge planning;Disease mangement/prevention;DME/adaptive equipment instruction;Functional mobility training;Neuromuscular re-education;Pain management;Patient/family education;Psychosocial support;Self Care/advanced ADL retraining;Skin care/wound managment;Splinting/orthotics;Therapeutic Activities;Therapeutic Exercise;UE/LE Strength taining/ROM;UE/LE Coordination activities;Visual/perceptual remediation/compensation OT Self Feeding Anticipated Outcome(s): Independent OT Basic Self-Care Anticipated Outcome(s): Mod I OT Toileting Anticipated Outcome(s): Mod I OT Bathroom Transfers Anticipated Outcome(s): Mod I OT Recommendation Recommendations for Other Services: Therapeutic Recreation consult Therapeutic Recreation Interventions: Stress management;Outing/community reintergration Patient destination: Home Follow Up Recommendations: Outpatient OT Equipment Recommended: None recommended by OT   Skilled Therapeutic Intervention OT eval completed with discussion of rehab process, OT purpose, POC, ELOS, and goals.  Completed ADL assessment at sit > stand level at sink at overall supervision.  Pt completed all mobility in room ambulating at supervision level without assistive device. Completed toilet transfers with CGA and tub/shower transfer stepping over tub ledge with min assist.  Discussed use of shower seat for energy conservation and recommendation for pt to utilize a suction cup grab bar for steady assist (as pt in apt and  cannot make modifications).  Pt reports one episode of lightheadedness, vitals assessed 99/65.  Returned to room and left upright in w/c with seat belt  alarm on and all needs in reach.  Notified RN of pt status.    OT Evaluation Precautions/Restrictions  Precautions Precautions: Fall Pain Pain Assessment Pain Scale: 0-10 Pain Score: 8  Pain Type: Acute pain Pain Location: Head Pain Orientation: Right;Medial Pain Onset: On-going Pain Intervention(s): RN made aware Home Living/Prior [place] expects to be discharged to:: Private residence Living Arrangements: Alone Available Help at Discharge: Neighbor, Available PRN/intermittently, Family Type of Home: Apartment Home Access: Ramped entrance Home Layout: One level Bathroom Shower/Tub: Optometrist: Yes  Lives With: Alone IADL History Homemaking Responsibilities: Yes Meal Prep Responsibility: Primary Laundry Responsibility: Primary Cleaning Responsibility: Primary Bill Paying/Finance Responsibility: Primary Shopping Responsibility: Primary Current License: No Prior Function Level of Independence: Independent with basic ADLs, Independent with homemaking with ambulation, Independent with gait  Able to Take Stairs?: Yes Driving: No Vocation: Retired Comments: Rides [employer] for transportation; walks without DME. Does cooking/cleaning. ; cancer center provides rides; enjoys "diamond" painting; enjoys crafts; does her own Patent attorney ADL ADL Grooming: Supervision/safety Where Assessed-Grooming: Standing at sink Upper Body Bathing: Supervision/safety Where Assessed-Upper Body Bathing: Standing at sink Lower Body Bathing: Supervision/safety Where Assessed-Lower Body Bathing: Sitting at sink, Standing at sink Upper Body Dressing: Supervision/safety Where Assessed-Upper Body Dressing: Sitting at sink Lower Body Dressing:  Supervision/safety Where Assessed-Lower Body Dressing: Standing at sink, Sitting at sink Toilet Transfer: Therapist, music Method: Ambulating Tub/Shower Transfer: Minimal assistance, Minimal cueing Tub/Shower Transfer Method: Ambulating Vision Baseline Vision/History: Wears glasses Wears Glasses: At all times Patient Visual Report: No change from baseline Vision Assessment?: Yes Eye Alignment: Within Functional Limits Ocular Range of Motion: Within Functional Limits Alignment/Gaze Preference: Within Defined Limits Tracking/Visual Pursuits: Able to track stimulus in all quads without difficulty Saccades: Within functional limits Convergence: Within functional limits Cognition Overall Cognitive Status: Impaired/Different from baseline Arousal/Alertness: Awake/alert Orientation Level: Person;Place;Situation Person: Oriented Place: Oriented Situation: Oriented Year: 2020 Month: February Day of Week: Correct Memory: Impaired Memory Impairment: Decreased recall of new information;Decreased short term memory;Retrieval deficit Decreased Short Term Memory: Verbal basic Immediate Memory Recall: Sock;Blue;Bed Memory Recall: Blue(unable to recall sock or bed even when given choice of 3) Memory Recall Blue: With Cue Attention: Selective Selective Attention: Appears intact Awareness: Impaired Awareness Impairment: Emergent impairment Problem Solving: Impaired Problem Solving Impairment: Functional complex;Verbal complex Safety/Judgment: Impaired Sensation Sensation Light Touch: Appears Intact Proprioception: Appears Intact Motor  Motor Motor: Within Functional Limits Mobility  Bed Mobility Bed Mobility: Rolling Right;Rolling Left;Sit to Supine;Supine to Sit Rolling Right: Supervision/verbal cueing Rolling Left: Supervision/Verbal cueing Supine to Sit: Supervision/Verbal cueing Sit to Supine: Supervision/Verbal cueing Transfers Sit to Stand: Supervision/Verbal  cueing Stand to Sit: Supervision/Verbal cueing  Trunk/Postural Assessment  Cervical Assessment Cervical Assessment: Within Functional Limits Thoracic Assessment Thoracic Assessment: Within Functional Limits Lumbar Assessment Lumbar Assessment: Within Functional Limits Postural Control Postural Control: Within Functional Limits  Balance Balance Balance Assessed: Yes Standardized Balance Assessment Standardized Balance Assessment: Berg Balance Test Berg Balance Test Sit to Stand: Able to stand without using hands and stabilize independently Standing Unsupported: Able to stand safely 2 minutes Sitting with Back Unsupported but Feet Supported on Floor or Stool: Able to sit safely and securely 2 minutes Stand to Sit: Controls descent by using hands Transfers: Able to transfer safely, minor use of hands Standing Unsupported with Eyes Closed: Able to stand 10 seconds safely Standing Ubsupported with Feet Together: Able to place feet together independently and stand 1 minute safely From Standing, Reach  Forward with Outstretched Arm: Can reach confidently >25 cm (10") From Standing Position, Pick up Object from Floor: Able to pick up shoe, needs supervision From Standing Position, Turn to Look Behind Over each Shoulder: Turn sideways only but maintains balance Turn 360 Degrees: Able to turn 360 degrees safely but slowly Standing Unsupported, Alternately Place Feet on Step/Stool: Able to complete 4 steps without aid or supervision Standing Unsupported, One Foot in Front: Able to take small step independently and hold 30 seconds Standing on One Leg: Tries to lift leg/unable to hold 3 seconds but remains standing independently Total Score: 43 Dynamic Sitting Balance Dynamic Sitting - Level of Assistance: 6: Modified independent (Device/Increase time) Dynamic Standing Balance Dynamic Standing - Level of Assistance: 5: Stand by assistance Extremity/Trunk Assessment RUE Assessment RUE  Assessment: Within Functional Limits General Strength Comments: strength grossly 4/5 LUE Assessment LUE Assessment: Within Functional Limits Active Range of Motion (AROM) Comments: shoulder flexion limited to 90* pt reports due to possible rotator cuff injury General Strength Comments: grossly 4+ to 5/5     Refer to Care Plan for Long Term Goals  Recommendations for other services: Therapeutic Recreation  Stress management and Outing/community reintegration   Discharge Criteria: Patient will be discharged from OT if patient refuses treatment 3 consecutive times without medical reason, if treatment goals not met, if there is a change in medical status, if patient makes no progress towards goals or if patient is discharged from hospital.  The above assessment, treatment plan, treatment alternatives and goals were discussed and mutually agreed upon: by patient  [place] Dense [facility] 03/12/2018, 8:45 AM

## 2018-03-12 NOTE — Significant Event (Signed)
Therapist found patient with facial droop, slurred speech, not moving RUE and LUE, acute changes. Nursing assessed, acute changes noted, vitals per chart. PA and MD notified, assessed patient. Code Stroke called. Last known well around 1350 in conversation with MD. Stroke team arrived and escorted patient to stat CT.

## 2018-03-12 NOTE — Sedation Documentation (Signed)
Right groin sheath removed, 7 Fr. Exoseal applied. Done at 1717 not 1617

## 2018-03-12 NOTE — Care Management Note (Signed)
[facility] Individual Statement of Services  Patient Name:  Nancy Norris  Date:  03/12/2018  Welcome to the [facility].  Our goal is to provide you with an individualized program based on your diagnosis and situation, designed to meet your specific needs.  With this comprehensive rehabilitation program, you will be expected to participate in at least 3 hours of rehabilitation therapies Monday-Friday, with modified therapy programming on the weekends.  Your rehabilitation program will include the following services:  Physical Therapy (PT), Occupational Therapy (OT), Speech Therapy (ST), 24 hour per day rehabilitation nursing, Neuropsychology, Case Management (Social Worker), Rehabilitation Medicine, Nutrition Services and Pharmacy Services  Weekly team conferences will be held on Wednesday to discuss your progress.  Your Social Worker will talk with you frequently to get your input and to update you on team discussions.  Team conferences with you and your family in attendance may also be held.  Expected length of stay: re-eval Overall anticipated outcome: re-eval  Depending on your progress and recovery, your program may change. Your Social Worker will coordinate services and will keep you informed of any changes. Your Social Worker's name and contact numbers are listed  below.  The following services may also be recommended but are not provided by the [facility]:    [place] will be made to provide these services after discharge if needed.  Arrangements include referral to agencies that provide these services.  Your insurance has been verified to be:  [place] Your primary doctor is:  [name]  Pertinent information will be shared with your doctor and your insurance company.  Social Worker:  Ovidio Kin, [place] or [phone]  Information discussed with and copy given to patient by: [name], 03/12/2018, 12:58 PM

## 2018-03-13 ENCOUNTER — Inpatient Hospital Stay (HOSPITAL_COMMUNITY): Payer: Medicare Other

## 2018-03-13 ENCOUNTER — Inpatient Hospital Stay (HOSPITAL_COMMUNITY): Payer: Medicare Other | Admitting: Occupational Therapy

## 2018-03-13 ENCOUNTER — Inpatient Hospital Stay (HOSPITAL_COMMUNITY): Payer: Medicare Other | Admitting: Speech Pathology

## 2018-03-13 DIAGNOSIS — D61818 Other pancytopenia: Secondary | ICD-10-CM

## 2018-03-13 LAB — PREPARE PLATELET PHERESIS: Unit division: 0

## 2018-03-13 LAB — BASIC METABOLIC PANEL
ANION GAP: 6 (ref 5–15)
BUN: 17 mg/dL (ref 8–23)
CO2: 24 mmol/L (ref 22–32)
Calcium: 8.3 mg/dL — ABNORMAL LOW (ref 8.9–10.3)
Chloride: 108 mmol/L (ref 98–111)
Creatinine, Ser: 2.25 mg/dL — ABNORMAL HIGH (ref 0.44–1.00)
GFR calc Af Amer: 24 mL/min — ABNORMAL LOW (ref >60)
GFR calc non Af Amer: 21 mL/min — ABNORMAL LOW (ref >60)
Glucose, Bld: 129 mg/dL — ABNORMAL HIGH (ref 70–99)
Potassium: 4.1 mmol/L (ref 3.5–5.1)
Sodium: 138 mmol/L (ref 135–145)

## 2018-03-13 LAB — GLUCOSE, CAPILLARY
GLUCOSE-CAPILLARY: 157 mg/dL — AB (ref 70–99)
GLUCOSE-CAPILLARY: 145 mg/dL — AB (ref 70–99)
Glucose-Capillary: 138 mg/dL — ABNORMAL HIGH (ref 70–99)
Glucose-Capillary: 123 mg/dL — ABNORMAL HIGH (ref 70–99)
Glucose-Capillary: 117 mg/dL — ABNORMAL HIGH (ref 70–99)
Glucose-Capillary: 109 mg/dL — ABNORMAL HIGH (ref 70–99)

## 2018-03-13 LAB — BPAM PLATELET PHERESIS
Blood Product Expiration Date: 202002282359
ISSUE DATE / TIME: 202002261654
UNIT TYPE AND RH: 5100

## 2018-03-13 LAB — PREPARE RBC (CROSSMATCH)

## 2018-03-13 LAB — CBC WITH DIFFERENTIAL/PLATELET
Abs Immature Granulocytes: 0.06 10*3/uL (ref 0.00–0.07)
Basophils Absolute: 0 10*3/uL (ref 0.0–0.1)
Basophils Relative: 0 %
Eosinophils Absolute: 0 10*3/uL (ref 0.0–0.5)
Eosinophils Relative: 0 %
HCT: 22.7 % — ABNORMAL LOW (ref 36.0–46.0)
Hemoglobin: 7.1 g/dL — ABNORMAL LOW (ref 12.0–15.0)
IMMATURE GRANULOCYTES: 1 %
Lymphocytes Relative: 14 %
Lymphs Abs: 0.9 10*3/uL (ref 0.7–4.0)
MCH: 31.8 pg (ref 26.0–34.0)
MCHC: 31.3 g/dL (ref 30.0–36.0)
MCV: 101.8 fL — ABNORMAL HIGH (ref 80.0–100.0)
Monocytes Absolute: 0.4 10*3/uL (ref 0.1–1.0)
Monocytes Relative: 6 %
Neutro Abs: 4.8 10*3/uL (ref 1.7–7.7)
Neutrophils Relative %: 79 %
Platelets: 85 10*3/uL — ABNORMAL LOW (ref 150–400)
RBC: 2.23 MIL/uL — AB (ref 3.87–5.11)
RDW: 21.5 % — ABNORMAL HIGH (ref 11.5–15.5)
WBC: 6.1 10*3/uL (ref 4.0–10.5)
nRBC: 0 % (ref 0.0–0.2)

## 2018-03-13 MED ORDER — BUPROPION HCL 75 MG PO TABS
75.0000 mg | ORAL_TABLET | Freq: Two times a day (BID) | ORAL | Status: DC
  Administered 2018-03-13 – 2018-03-15 (×5): 75 mg via ORAL
  Filled 2018-03-13 (×5): qty 1

## 2018-03-13 MED ORDER — SODIUM CHLORIDE 0.9% FLUSH
10.0000 mL | Freq: Two times a day (BID) | INTRAVENOUS | Status: DC
  Administered 2018-03-13: 20 mL
  Administered 2018-03-14 – 2018-03-15 (×3): 10 mL

## 2018-03-13 MED ORDER — CROMOLYN SODIUM 4 % OP SOLN
1.0000 [drp] | Freq: Four times a day (QID) | OPHTHALMIC | Status: DC | PRN
  Filled 2018-03-13: qty 10

## 2018-03-13 MED ORDER — LEVOTHYROXINE SODIUM 75 MCG PO TABS
150.0000 ug | ORAL_TABLET | Freq: Every day | ORAL | Status: DC
  Administered 2018-03-14 – 2018-03-15 (×2): 150 ug via ORAL
  Filled 2018-03-13 (×2): qty 2

## 2018-03-13 MED ORDER — ATORVASTATIN CALCIUM 10 MG PO TABS
20.0000 mg | ORAL_TABLET | Freq: Every day | ORAL | Status: DC
  Administered 2018-03-13 – 2018-03-14 (×2): 20 mg via ORAL
  Filled 2018-03-13 (×2): qty 2

## 2018-03-13 MED ORDER — FOLIC ACID 1 MG PO TABS
1.0000 mg | ORAL_TABLET | Freq: Every day | ORAL | Status: DC
  Administered 2018-03-13 – 2018-03-15 (×3): 1 mg via ORAL
  Filled 2018-03-13 (×3): qty 1

## 2018-03-13 MED ORDER — PANTOPRAZOLE SODIUM 40 MG PO TBEC
40.0000 mg | DELAYED_RELEASE_TABLET | Freq: Every day | ORAL | Status: DC
  Administered 2018-03-13 – 2018-03-15 (×3): 40 mg via ORAL
  Filled 2018-03-13 (×3): qty 1

## 2018-03-13 MED ORDER — ASPIRIN EC 325 MG PO TBEC
325.0000 mg | DELAYED_RELEASE_TABLET | Freq: Every day | ORAL | Status: DC
  Administered 2018-03-13 – 2018-03-14 (×2): 325 mg via ORAL
  Filled 2018-03-13 (×2): qty 1

## 2018-03-13 MED ORDER — MIDODRINE HCL 5 MG PO TABS
10.0000 mg | ORAL_TABLET | Freq: Every day | ORAL | Status: DC
  Administered 2018-03-13 – 2018-03-15 (×3): 10 mg via ORAL
  Filled 2018-03-13 (×3): qty 2

## 2018-03-13 MED ORDER — ENSURE ENLIVE PO LIQD
237.0000 mL | Freq: Two times a day (BID) | ORAL | Status: DC
  Administered 2018-03-13 – 2018-03-14 (×2): 237 mL via ORAL

## 2018-03-13 MED ORDER — SODIUM CHLORIDE 0.9% IV SOLUTION: Freq: Once | INTRAVENOUS | Status: DC

## 2018-03-13 MED ORDER — CITALOPRAM HYDROBROMIDE 40 MG PO TABS
40.0000 mg | ORAL_TABLET | Freq: Every day | ORAL | Status: DC
  Administered 2018-03-13 – 2018-03-15 (×3): 40 mg via ORAL
  Filled 2018-03-13 (×2): qty 4
  Filled 2018-03-13: qty 1

## 2018-03-13 MED ORDER — DIPHENHYDRAMINE HCL 25 MG PO CAPS
25.0000 mg | ORAL_CAPSULE | Freq: Once | ORAL | Status: AC
  Administered 2018-03-13: 25 mg via ORAL
  Filled 2018-03-13: qty 1

## 2018-03-13 MED ORDER — VITAMIN B-12 1000 MCG PO TABS
1000.0000 ug | ORAL_TABLET | Freq: Every day | ORAL | Status: DC
  Administered 2018-03-13 – 2018-03-15 (×3): 1000 ug via ORAL
  Filled 2018-03-13 (×3): qty 1

## 2018-03-13 MED ORDER — IPRATROPIUM-ALBUTEROL 0.5-2.5 (3) MG/3ML IN SOLN: 3.0000 mL | RESPIRATORY_TRACT | Status: DC | PRN

## 2018-03-13 MED ORDER — ALBUTEROL SULFATE (2.5 MG/3ML) 0.083% IN NEBU: 3.0000 mL | INHALATION_SOLUTION | Freq: Four times a day (QID) | RESPIRATORY_TRACT | Status: DC | PRN

## 2018-03-13 MED ORDER — SODIUM CHLORIDE 0.9% FLUSH
10.0000 mL | INTRAVENOUS | Status: DC | PRN
  Administered 2018-03-14: 10 mL
  Filled 2018-03-13: qty 40

## 2018-03-13 MED ORDER — CHLORHEXIDINE GLUCONATE CLOTH 2 % EX PADS
6.0000 | MEDICATED_PAD | Freq: Every day | CUTANEOUS | Status: DC
  Administered 2018-03-14: 6 via TOPICAL

## 2018-03-13 NOTE — Progress Notes (Signed)
[facility] INPATIENT PROGRESS NOTE  SUBJECTIVE: Nancy [facility] 78 y.o. female followed by Dr. Julien Nordmann for multiple myeloma.  She has a new diagnosis of gastric adenocarcinoma.  She was hospitalized recently with bilateral cortical and subcortical infarcts with suspected new left middle cerebral artery infarction.  During her hospitalization she was noted to have an acute deep vein thrombosis.  The patient also had an abnormal barium swallow and possible GI bleed.  Upper endoscopy showed a large fungating mass in the proximal stomach. Biopsy consistent with adenocarcinoma. Anticoagulation was held due to risk of GI bleed. She received Aspirin 325 mg only.  She had an IVC filter placed on 03/09/2018.  The patient was discharged to inpatient rehabilitation on 03/11/2018 and on 03/12/2018 was found to have facial droop, slurred speech, and right hemiparesis.  She was transferred to [facility] and underwent a successful left common carotid arteriogram with complete revascularization of occluded left MCA, distal M1 with 2 passes with 5 mm x 33 mm embotrap retriever achieving TICI 3 revascularization with Dr. Estanislado Pandy on 03/12/2018.   When seen today, the patient was alert.  She has mild expressive aphasia but significantly improved according to her daughter.  Right-sided hemiparesis has improved.  Denies abdominal discomfort, nausea, vomiting.  Denies bleeding.  ALLERGIES:  is allergic to codeine; latex; chocolate; onion; zyprexa [olanzapine]; adhesive [tape]; heparin; hydrocodone; iodinated diagnostic agents; oxycodone; promethazine-dm; and sulfa antibiotics.  MEDICATIONS:  Current Facility-Administered Medications  Medication Dose Route Frequency Provider Last Rate Last Dose  .  stroke: mapping our early stages of recovery book   Does not apply Once Rosalin Hawking, MD      . 0.9 %  sodium chloride infusion   Intravenous Continuous Rosalin Hawking, MD 75 mL/hr at 03/13/18 1200    . acetaminophen  (TYLENOL) tablet 650 mg  650 mg Oral Q4H PRN Deveshwar, Willaim Rayas, MD      . albuterol (PROVENTIL HFA;VENTOLIN HFA) 108 (90 Base) MCG/ACT inhaler 1-2 puff  1-2 puff Inhalation Q6H PRN Rosalin Hawking, MD      . aspirin EC tablet 325 mg  325 mg Oral Daily Rosalin Hawking, MD      . atorvastatin (LIPITOR) tablet 20 mg  20 mg Oral q1800 Rosalin Hawking, MD      . buPROPion [facility]) tablet 75 mg  75 mg Oral BID Rosalin Hawking, MD      . citalopram (CELEXA) tablet 40 mg  40 mg Oral Daily Rosalin Hawking, MD      . cromolyn (OPTICROM) 4 % ophthalmic solution 1-2 drop  1-2 drop Both Eyes QID PRN Rosalin Hawking, MD      . feeding supplement (ENSURE ENLIVE) (ENSURE ENLIVE) liquid 237 mL  237 mL Oral BID BM Rosalin Hawking, MD      . folic acid (FOLVITE) tablet 1 mg  1 mg Oral Daily Rosalin Hawking, MD      . ipratropium-albuterol (DUONEB) 0.5-2.5 (3) MG/3ML nebulizer solution 3 mL  3 mL Nebulization Q4H PRN Rosalin Hawking, MD      . Derrill Memo ON 03/14/2018] levothyroxine (SYNTHROID, LEVOTHROID) tablet 150 mcg  150 mcg Oral QAC breakfast Rosalin Hawking, MD      . midodrine (PROAMATINE) tablet 10 mg  10 mg Oral Daily Rosalin Hawking, MD      . pantoprazole (PROTONIX) EC tablet 40 mg  40 mg Oral Daily Rosalin Hawking, MD      . vitamin B-12 (CYANOCOBALAMIN) tablet 1,000 mcg  1,000 mcg Oral Daily Erlinda Hong, Jindong,  MD        REVIEW OF SYSTEMS:   Review of Systems  Constitutional: Negative.   HENT: Negative.   Eyes: Negative.   Respiratory: Negative.   Cardiovascular: Negative.   Gastrointestinal: Negative.   Genitourinary: Negative.   Musculoskeletal: Negative.   Skin: Negative.   Neurological: Negative for headaches.       Mild expressive aphasia. Improved right hemiparesis.  Endo/Heme/Allergies: Negative.   Psychiatric/Behavioral: Negative.       PHYSICAL EXAMINATION:  Vital signs in last 24 hours: Temp:  [97.2 F (36.2 C)-98.1 F (36.7 C)] 98.1 F (36.7 C) (02/27 1133) Pulse Rate:  [55-86] 75 (02/27 1200) Resp:  [8-20] 8 (02/27  1100) BP: (76-138)/(42-83) 99/67 (02/27 1200) SpO2:  [91 %-100 %] 99 % (02/27 1200) Arterial Line BP: (108-156)/(47-70) 136/53 (02/27 0900) Weight change:  Last BM Date: 03/11/18  ECOG PERFORMANCE STATUS: 1 - Symptomatic but completely ambulatory  Intake/Output from previous day: 02/26 0701 - 02/27 0700 In: 1672.2 [I.V.:1672.2] Out: 1175 [Urine:1150; Blood:25] General: Alert, awake without distress. Head: Normocephalic atraumatic. Mouth: mucous membranes moist, pharynx normal without lesions Eyes: No scleral icterus.  Pupils are equal and round reactive to light. Resp: clear to auscultation bilaterally without rhonchi or wheezes or dullness to percussion. Cardio: regular rate and rhythm, S1, S2 normal, no murmur, click, rub or gallop GI: soft, non-tender; bowel sounds normal; no masses,  no organomegaly Musculoskeletal: No joint deformity or effusion. Neurological: Bilateral upper and bilateral lower extremity symmetrical 4/5.  Sensation intact. Skin: No rashes or lesions.  LABORATORY DATA: Lab Results  Component Value Date   WBC 6.1 03/13/2018   HGB 7.1 (L) 03/13/2018   HCT 22.7 (L) 03/13/2018   MCV 101.8 (H) 03/13/2018   PLT 85 (L) 03/13/2018   CMP Latest Ref Rng & Units 03/13/2018 03/12/2018 03/11/2018  Glucose 70 - 99 mg/dL 129(H) 112(H) 105(H)  BUN 8 - 23 mg/dL _0 Creatinine 0.44 - 1.00 mg/dL 2.25(H) 2.39(H) 2.13(H)  Sodium 135 - 145 mmol/L 138 138 140  Potassium 3.5 - 5.1 mmol/L 4.1 3.9 3.9  Chloride 98 - 111 mmol/L 108 101 108  CO2 22 - 32 mmol/L _1 Calcium 8.9 - 10.3 mg/dL 8.3(L) 9.2 8.8(L)  Total Protein 6.5 - 8.1 g/dL - 5.4(L) -  Total Bilirubin 0.3 - 1.2 mg/dL - 0.7 -  Alkaline Phos 38 - 126 U/L - 76 -  AST 15 - 41 U/L - 19 -  ALT 0 - 44 U/L - 10 -   RADIOGRAPHIC STUDIES:  Ct Abdomen Pelvis Wo Contrast  Result Date: 03/08/2018 CLINICAL DATA:  Large ulcerated gastric fundal mass found on upper endoscopy today. EXAM: CT CHEST, ABDOMEN AND PELVIS  WITHOUT CONTRAST TECHNIQUE: Multidetector CT imaging of the chest, abdomen and pelvis was performed following the standard protocol without IV contrast. COMPARISON:  CT chest 01/20/2018 FINDINGS: CT CHEST FINDINGS Cardiovascular: The heart size is normal. No substantial pericardial effusion. Coronary artery calcification is evident. Atherosclerotic calcification is noted in the wall of the thoracic aorta. Right Port-A-Cath tip is positioned in the distal SVC near the junction with the RA. Mediastinum/Nodes: 12 mm short axis prevascular node seen on 17/3. Scattered small central mediastinal lymph nodes identified in the upper mediastinum. 12 mm short axis retrotracheal node visible on 16/3. No subcarinal or lower paraesophageal lymphadenopathy. No evidence for gross hilar lymphadenopathy although assessment is limited by the lack of intravenous contrast on today's study. There is no axillary lymphadenopathy. Lungs/Pleura:  The central tracheobronchial airways are patent. 19 mm ring-like lesion in the right upper lobe is stable and may represent airway impaction with associated tree-in-bud nodularity. 6 mm medial right upper lobe nodule visible on 67/4. 5 mm right middle lobe nodule visible on 102/4, stable. 1.9 x 1.2 cm irregular opacity in the posterior left upper lobe is at the site of the 4.2 x 3.8 cm mass seen on PET-CT of 10/11/2015 and may reflect residual scarring. Small left pleural effusion evident. Musculoskeletal: No worrisome lytic or sclerotic osseous abnormality. CT ABDOMEN PELVIS FINDINGS Hepatobiliary: No focal abnormality in the liver on this study without intravenous contrast. Gallbladder surgically absent. No intrahepatic or extrahepatic biliary dilation. Pancreas: No focal mass lesion. No dilatation of the main duct. No intraparenchymal cyst. No peripancreatic edema. Spleen: Subtle 2.7 x 2.0 cm hypoattenuating lesion is identified in the dome of spleen, not definitely seen on prior.  Adrenals/Urinary Tract: Right adrenal gland unremarkable. 1.6 cm left adrenal nodule stable since PET-CT of 10/01/2015. Cortical scarring noted in both kidneys. No evidence for hydroureter. The urinary bladder appears normal for the degree of distention. Stomach/Bowel: Tiny hiatal hernia noted. Wall thickening is noted in the esophagogastric junction and proximal stomach. Wall thickening extends along the medial aspect of the proximal stomach. Distal stomach unremarkable. Duodenum is normally positioned as is the ligament of Treitz. No small bowel wall thickening. No small bowel dilatation. The terminal ileum is normal. The appendix is not visualized, but there is no edema or inflammation in the region of the cecum. No gross colonic mass. No colonic wall thickening. Vascular/Lymphatic: There is abdominal aortic atherosclerosis without aneurysm. Bulky lymphadenopathy is seen in the hepato duodenal ligament. 3.2 x 4.5 cm lymph node seen on 55/3. The 2.9 x 2.8 cm lymph node seen on 57/3. 10 mm short axis portal caval lymph node (63/3) is suspicious. No para-aortic retroperitoneal lymphadenopathy. No pelvic sidewall lymphadenopathy. Reproductive: The uterus is surgically absent. There is no adnexal mass. Other: No intraperitoneal free fluid. Musculoskeletal: No worrisome lytic or sclerotic osseous abnormality. IMPRESSION: 1. Marked wall thickening in the esophagogastric junction and proximal stomach, consistent with known neoplasm. Bulky lymphadenopathy in the gastrohepatic ligament is compatible with metastatic disease. 10 mm short axis portal caval lymph node also suspicious. 2. 2.7 cm hypoattenuating lesion in the spleen. This is not well assessed on today's noncontrast exam. Close attention recommended as metastatic disease not excluded. 3. Scattered tiny pulmonary nodules measuring up to 5 mm. Nonspecific, but close attention recommended as metastatic disease not excluded. 4. Mild lymphadenopathy noted in the upper  mediastinum. 5. Circular lesion right upper lobe on axial imaging has a branching configuration on coronal and sagittal images. This is probably airway impaction related to atypical infection. Close follow-up recommended. 6. 1.9 x 1.2 cm irregular lesion posterior left upper lobe at the site of a 4 cm mass on PET-CT of 10/11/2015. Imaging features today are compatible with residua of that lesion. 7. 1.6 cm left adrenal nodule stable since 10/11/2015, likely benign. 8.  Aortic Atherosclerois (ICD10-170.0) Electronically Signed   By: Misty Stanley M.D.   On: 03/08/2018 19:19   Ct Angio Head W Or Wo Contrast  Result Date: 03/12/2018 CLINICAL DATA:  Stroke, follow-up. Recent acute nonhemorrhagic infarcts involving the medial left occipital lobe and bilateral parietal lobes. Punctate acute infarct in the posterior left frontal lobe. EXAM: CT ANGIOGRAPHY HEAD AND NECK TECHNIQUE: Multidetector CT imaging of the head and neck was performed using the standard protocol during bolus administration  of intravenous contrast. Multiplanar CT image reconstructions and MIPs were obtained to evaluate the vascular anatomy. Carotid stenosis measurements (when applicable) are obtained utilizing NASCET criteria, using the distal internal carotid diameter as the denominator. CONTRAST:  40m ISOVUE-370 IOPAMIDOL (ISOVUE-370) INJECTION 76% COMPARISON:  MRI of the brain and MRA [facility] 03/03/2018. CT head without contrast 03/12/2018. FINDINGS: CTA NECK FINDINGS Aortic arch: A 3 vessel arch configuration is present. Atherosclerotic changes are noted at the origin of the left subclavian artery without significant stenosis. Additional calcifications are present in the distal arch. There is no aneurysm. Right carotid system: The right common carotid artery is tortuous proximally. Minimal atherosclerotic changes are noted at the right carotid bifurcation. There is no significant stenosis. There is mild tortuosity of the cervical right  ICA without significant stenosis. Left carotid system: The left common carotid artery is mildly tortuous without significant stenosis. The bifurcation is unremarkable. There is moderate tortuosity of the more distal cervical left ICA without a significant stenosis. Vertebral arteries: The left vertebral artery is slightly dominant to the right. Both vertebral arteries originate from the subclavian arteries without significant stenosis. There is no significant stenosis or vascular injury to either vertebral artery in the neck. Skeleton: Levoconvex curvature is present in the lower cervical spine. Grade 1 degenerative anterolisthesis is present at C4-5. Uncovertebral disease is present bilaterally at C5-6 and C6-7. Other neck: The soft tissues of the neck demonstrate moderate atrophy of the thyroid. Salivary glands are within normal limits bilaterally. Mediastinal and thoracic inlet adenopathy again noted, recent described on CT of the chest. Upper chest: A ring-like lesion in the right upper lobe measures 18 mm, stable. No other focal nodules are visualized. Centrilobular emphysematous changes are present. Review of the MIP images confirms the above findings CTA HEAD FINDINGS Anterior circulation: Atherosclerotic calcifications are present within the cavernous internal carotid arteries bilaterally. There is no significant stenosis relative to the ICA termini. The A1 and M1 segments are normal. Anterior communicating artery is patent. MCA bifurcations are intact bilaterally. There is a high-grade stenosis or nonocclusive thrombus involving the superior left M2 division proximally. Posterior circulation: Left vertebral artery is dominant. PICA origins are visualized and normal. Both posterior cerebral arteries originate from basilar tip. A left posterior communicating artery contributes. PCA branch vessels are within normal limits bilaterally. Venous sinuses: Dural sinuses are patent. Straight sinus deep cerebral veins  are intact. Cortical veins are within normal limits. Anatomic variants: None Review of the MIP images confirms the above findings IMPRESSION: 1. High-grade stenosis versus nonocclusive thrombus involving the proximal left superior M2 division. 2. No other focal stenosis or occlusion. 3. No aneurysm. 4. Tortuosity of the cervical vasculature without significant stenosis. 5. Aortic Atherosclerosis (ICD10-I70.0). No aneurysm or stenosis at the aortic arch. Electronically Signed   By: CSan MorelleM.D.   On: 03/12/2018 15:37   Dg Chest 2 View  Result Date: 03/03/2018 CLINICAL DATA:  Weakness, dizziness and confusion over the last 2 weeks. History of renal cell cancer and myeloma. EXAM: CHEST - 2 VIEW COMPARISON:  02/26/2018 radiography.  CT 01/20/2018. FINDINGS: Heart size is normal. Chronic aortic atherosclerosis. Power port inserted from a right jugular approach has its tip at the SVC RA junction. The lungs are clear except for mild scarring. No sign of active infiltrate, mass, effusion or collapse. IMPRESSION: No active cardiopulmonary disease. Electronically Signed   By: MNelson ChimesM.D.   On: 03/03/2018 12:58   Ct Head Wo Contrast  Result Date: 03/05/2018 CLINICAL  DATA:  Headache beginning this afternoon. History of multiple myeloma, thrombocytopenia and stroke. EXAM: CT HEAD WITHOUT CONTRAST TECHNIQUE: Contiguous axial images were obtained from the base of the skull through the vertex without intravenous contrast. COMPARISON:  MRI of the head March 03, 2018 and CT HEAD March 03, 2018. FINDINGS: BRAIN: Patchy LEFT > RIGHT parietal and LEFT occipital hypodensities are increasingly conspicuous from prior CT. No propagation. No intraparenchymal hemorrhage, mass effect or midline shift. Patchy LEFT frontal white matter hypodensities most compatible with chronic small vessel ischemic changes. Old small LEFT cerebellar infarct. No parenchymal brain volume loss for age. No hydrocephalus. No abnormal  extra-axial fluid collections. VASCULAR: Moderate calcific atherosclerosis of the carotid siphons. SKULL: No skull fracture. Moderate temporomandibular osteoarthrosis. No significant scalp soft tissue swelling. SINUSES/ORBITS: Trace paranasal sinus mucosal thickening. Mastoid air cells are well aerated.The included ocular globes and orbital contents are non-suspicious. Status post bilateral ocular lens implants. OTHER: None. IMPRESSION: 1. Involving acute LEFT > RIGHT parietal and LEFT occipital lobe infarcts without hemorrhagic conversion. 2. Mild-to-moderate chronic small vessel ischemic changes. Old small LEFT cerebellar infarct. Electronically Signed   By: Elon Alas M.D.   On: 03/05/2018 17:25   Ct Head Wo Contrast  Result Date: 03/03/2018 CLINICAL DATA:  Confused. Altered level of consciousness. Breast cancer. EXAM: CT HEAD WITHOUT CONTRAST TECHNIQUE: Contiguous axial images were obtained from the base of the skull through the vertex without intravenous contrast. COMPARISON:  02/26/2018. FINDINGS: Brain: Two separate areas of ill-defined abnormal cortical and subcortical edema in the LEFT hemisphere, affecting the LEFT parietal and LEFT occipital lobes, could represent multifocal infarcts versus rapid progression of metastatic disease. These areas were previously unremarkable on CT 5 days earlier. MRI of the brain without and with contrast recommended for further evaluation. Elsewhere, mild to moderate atrophy. Hypoattenuation of white matter, consistent with small vessel disease/post treatment effect. Vascular: Calcification of the cavernous internal carotid arteries consistent with cerebrovascular atherosclerotic disease. No signs of intracranial large vessel occlusion. Skull: Calvarium intact. Sinuses/Orbits: No sinus disease. Negative orbits. Other: None. IMPRESSION: Two separate areas of abnormal cortical and subcortical edema in the LEFT hemisphere, affecting the LEFT parietal and LEFT  occipital lobes, could represent multifocal infarcts versus rapid progression of metastatic disease. These were not present on the previous CT from 02/26/2018. MRI of the brain without and with contrast recommended for further evaluation. Electronically Signed   By: Staci Righter M.D.   On: 03/03/2018 13:26   Ct Head Wo Contrast  Result Date: 02/26/2018 CLINICAL DATA:  Blackouts for 1 month. History of multiple myeloma. EXAM: CT HEAD WITHOUT CONTRAST TECHNIQUE: Contiguous axial images were obtained from the base of the skull through the vertex without intravenous contrast. COMPARISON:  12/22/2015. FINDINGS: Brain: No evidence for acute infarction, hemorrhage, mass lesion, hydrocephalus, or extra-axial fluid. Mild atrophy. Hypoattenuation of white matter, consistent with small vessel disease or post treatment effect. 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 Vascular: Calcification of the cavernous internal carotid arteries consistent with cerebrovascular atherosclerotic disease. No signs of intracranial large vessel occlusion. Skull: Calvarium intact. Small lucency in the RIGHT frontal bone (series 3 image 50) unchanged from 2017. Sinuses/Orbits: No acute finding. Other: None. IMPRESSION: Atrophy and small vessel disease. No acute intracranial findings. Electronically Signed   By: Staci Righter M.D.   On: 02/26/2018 18:23   Ct Angio Neck W Or Wo Contrast  Result Date: 03/12/2018 CLINICAL DATA:  Stroke, follow-up. Recent acute nonhemorrhagic infarcts involving the medial left occipital lobe and  bilateral parietal lobes. Punctate acute infarct in the posterior left frontal lobe. EXAM: CT ANGIOGRAPHY HEAD AND NECK TECHNIQUE: Multidetector CT imaging of the head and neck was performed using the standard protocol during bolus administration of intravenous contrast. Multiplanar CT image reconstructions and MIPs were obtained to evaluate the vascular anatomy. Carotid stenosis measurements (when  applicable) are obtained utilizing NASCET criteria, using the distal internal carotid diameter as the denominator. CONTRAST:  17m ISOVUE-370 IOPAMIDOL (ISOVUE-370) INJECTION 76% COMPARISON:  MRI of the brain and MRA circle-of-[facility] 03/03/2018. CT head without contrast 03/12/2018. FINDINGS: CTA NECK FINDINGS Aortic arch: A 3 vessel arch configuration is present. Atherosclerotic changes are noted at the origin of the left subclavian artery without significant stenosis. Additional calcifications are present in the distal arch. There is no aneurysm. Right carotid system: The right common carotid artery is tortuous proximally. Minimal atherosclerotic changes are noted at the right carotid bifurcation. There is no significant stenosis. There is mild tortuosity of the cervical right ICA without significant stenosis. Left carotid system: The left common carotid artery is mildly tortuous without significant stenosis. The bifurcation is unremarkable. There is moderate tortuosity of the more distal cervical left ICA without a significant stenosis. Vertebral arteries: The left vertebral artery is slightly dominant to the right. Both vertebral arteries originate from the subclavian arteries without significant stenosis. There is no significant stenosis or vascular injury to either vertebral artery in the neck. Skeleton: Levoconvex curvature is present in the lower cervical spine. Grade 1 degenerative anterolisthesis is present at C4-5. Uncovertebral disease is present bilaterally at C5-6 and C6-7. Other neck: The soft tissues of the neck demonstrate moderate atrophy of the thyroid. Salivary glands are within normal limits bilaterally. Mediastinal and thoracic inlet adenopathy again noted, recent described on CT of the chest. Upper chest: A ring-like lesion in the right upper lobe measures 18 mm, stable. No other focal nodules are visualized. Centrilobular emphysematous changes are present. Review of the MIP images confirms the  above findings CTA HEAD FINDINGS Anterior circulation: Atherosclerotic calcifications are present within the cavernous internal carotid arteries bilaterally. There is no significant stenosis relative to the ICA termini. The A1 and M1 segments are normal. Anterior communicating artery is patent. MCA bifurcations are intact bilaterally. There is a high-grade stenosis or nonocclusive thrombus involving the superior left M2 division proximally. Posterior circulation: Left vertebral artery is dominant. PICA origins are visualized and normal. Both posterior cerebral arteries originate from basilar tip. A left posterior communicating artery contributes. PCA branch vessels are within normal limits bilaterally. Venous sinuses: Dural sinuses are patent. Straight sinus deep cerebral veins are intact. Cortical veins are within normal limits. Anatomic variants: None Review of the MIP images confirms the above findings IMPRESSION: 1. High-grade stenosis versus nonocclusive thrombus involving the proximal left superior M2 division. 2. No other focal stenosis or occlusion. 3. No aneurysm. 4. Tortuosity of the cervical vasculature without significant stenosis. 5. Aortic Atherosclerosis (ICD10-I70.0). No aneurysm or stenosis at the aortic arch. Electronically Signed   By: CSan MorelleM.D.   On: 03/12/2018 15:37   Ct Chest Wo Contrast  Result Date: 03/08/2018 CLINICAL DATA:  Large ulcerated gastric fundal mass found on upper endoscopy today. EXAM: CT CHEST, ABDOMEN AND PELVIS WITHOUT CONTRAST TECHNIQUE: Multidetector CT imaging of the chest, abdomen and pelvis was performed following the standard protocol without IV contrast. COMPARISON:  CT chest 01/20/2018 FINDINGS: CT CHEST FINDINGS Cardiovascular: The heart size is normal. No substantial pericardial effusion. Coronary artery calcification is evident. Atherosclerotic calcification  is noted in the wall of the thoracic aorta. Right Port-A-Cath tip is positioned in the  distal SVC near the junction with the RA. Mediastinum/Nodes: 12 mm short axis prevascular node seen on 17/3. Scattered small central mediastinal lymph nodes identified in the upper mediastinum. 12 mm short axis retrotracheal node visible on 16/3. No subcarinal or lower paraesophageal lymphadenopathy. No evidence for gross hilar lymphadenopathy although assessment is limited by the lack of intravenous contrast on today's study. There is no axillary lymphadenopathy. Lungs/Pleura: The central tracheobronchial airways are patent. 19 mm ring-like lesion in the right upper lobe is stable and may represent airway impaction with associated tree-in-bud nodularity. 6 mm medial right upper lobe nodule visible on 67/4. 5 mm right middle lobe nodule visible on 102/4, stable. 1.9 x 1.2 cm irregular opacity in the posterior left upper lobe is at the site of the 4.2 x 3.8 cm mass seen on PET-CT of 10/11/2015 and may reflect residual scarring. Small left pleural effusion evident. Musculoskeletal: No worrisome lytic or sclerotic osseous abnormality. CT ABDOMEN PELVIS FINDINGS Hepatobiliary: No focal abnormality in the liver on this study without intravenous contrast. Gallbladder surgically absent. No intrahepatic or extrahepatic biliary dilation. Pancreas: No focal mass lesion. No dilatation of the main duct. No intraparenchymal cyst. No peripancreatic edema. Spleen: Subtle 2.7 x 2.0 cm hypoattenuating lesion is identified in the dome of spleen, not definitely seen on prior. Adrenals/Urinary Tract: Right adrenal gland unremarkable. 1.6 cm left adrenal nodule stable since PET-CT of 10/01/2015. Cortical scarring noted in both kidneys. No evidence for hydroureter. The urinary bladder appears normal for the degree of distention. Stomach/Bowel: Tiny hiatal hernia noted. Wall thickening is noted in the esophagogastric junction and proximal stomach. Wall thickening extends along the medial aspect of the proximal stomach. Distal stomach  unremarkable. Duodenum is normally positioned as is the ligament of Treitz. No small bowel wall thickening. No small bowel dilatation. The terminal ileum is normal. The appendix is not visualized, but there is no edema or inflammation in the region of the cecum. No gross colonic mass. No colonic wall thickening. Vascular/Lymphatic: There is abdominal aortic atherosclerosis without aneurysm. Bulky lymphadenopathy is seen in the hepato duodenal ligament. 3.2 x 4.5 cm lymph node seen on 55/3. The 2.9 x 2.8 cm lymph node seen on 57/3. 10 mm short axis portal caval lymph node (63/3) is suspicious. No para-aortic retroperitoneal lymphadenopathy. No pelvic sidewall lymphadenopathy. Reproductive: The uterus is surgically absent. There is no adnexal mass. Other: No intraperitoneal free fluid. Musculoskeletal: No worrisome lytic or sclerotic osseous abnormality. IMPRESSION: 1. Marked wall thickening in the esophagogastric junction and proximal stomach, consistent with known neoplasm. Bulky lymphadenopathy in the gastrohepatic ligament is compatible with metastatic disease. 10 mm short axis portal caval lymph node also suspicious. 2. 2.7 cm hypoattenuating lesion in the spleen. This is not well assessed on today's noncontrast exam. Close attention recommended as metastatic disease not excluded. 3. Scattered tiny pulmonary nodules measuring up to 5 mm. Nonspecific, but close attention recommended as metastatic disease not excluded. 4. Mild lymphadenopathy noted in the upper mediastinum. 5. Circular lesion right upper lobe on axial imaging has a branching configuration on coronal and sagittal images. This is probably airway impaction related to atypical infection. Close follow-up recommended. 6. 1.9 x 1.2 cm irregular lesion posterior left upper lobe at the site of a 4 cm mass on PET-CT of 10/11/2015. Imaging features today are compatible with residua of that lesion. 7. 1.6 cm left adrenal nodule stable since 10/11/2015, likely  benign. 8.  Aortic Atherosclerois (ICD10-170.0) Electronically Signed   By: Misty Stanley M.D.   On: 03/08/2018 19:19   Mr Brain Wo Contrast  Result Date: 03/03/2018 CLINICAL DATA:  Sudden onset confusion. EXAM: MRI HEAD WITHOUT CONTRAST TECHNIQUE: Multiplanar, multiecho pulse sequences of the brain and surrounding structures were obtained without intravenous contrast. COMPARISON:  Head CT 03/03/2018 FINDINGS: The examination had to be discontinued prior to completion due to altered mental status and inability to follow the technologist's instructions. Coronal and axial diffusion-weighted imaging, sagittal T1-weighted imaging and axial T2-weighted imaging were obtained. There is multifocal abnormal diffusion restriction within both hemispheres, including the left frontal lobe, both parietal lobes, left occipital lobe and both cerebellar hemispheres. The largest area of diffusion abnormalities in the posterior left parietal lobe. There is no midline shift or other mass effect. The midline structures are normal. There is moderate white matter hyperintense T2-weighted signal consistent with chronic microvascular ischemia. IMPRESSION: 1. Examination discontinued early due to patient altered mental status. 2. Multifocal bilateral acute ischemia within multiple vascular territories in both hemispheres. This may indicate a central cardiac or aortic embolic source. Electronically Signed   By: Ulyses Jarred M.D.   On: 03/03/2018 15:04   Ir Ivc Filter Plmt / S&i /img Guid/mod Sed  Result Date: 03/09/2018 INDICATION: Paradoxical embolus.  Calf vein DVT.  Pulmonary embolism. EXAM: IVC FILTER,INFERIOR VENA CAVOGRAM MEDICATIONS: None. ANESTHESIA/SEDATION: Fentanyl 75 mcg IV; Versed 1 mg IV Moderate Sedation Time:  17 minutes The patient was continuously monitored during the procedure by the interventional radiology nurse under my direct supervision. FLUOROSCOPY TIME:  Fluoroscopy Time: 3 minutes 30 seconds (31 mGy).  COMPLICATIONS: None immediate. PROCEDURE: Informed written consent was obtained from the patient after a thorough discussion of the procedural risks, benefits and alternatives. All questions were addressed. Maximal Sterile Barrier Technique was utilized including caps, mask, sterile gowns, sterile gloves, sterile drape, hand hygiene and skin antiseptic. A timeout was performed prior to the initiation of the procedure. The right neck was prepped with Betadine in a sterile fashion, and a sterile drape was applied covering the operative field. A sterile gown and sterile gloves were used for the procedure. The right jugular vein was noted to be patent initially with ultrasound. Under sonographic guidance, a micropuncture needle was inserted into the right jugular vein (Ultrasound image documentation was performed). It was removed over an 018 wire which was up-sized to a Taft. The sheath was inserted over the wire and into the IVC. IVC carbon dioxide venography was performed. The temporary Denali filter was then deployed in the infrarenal IVC. The sheath was removed and hemostasis was achieved with direct pressure. FINDINGS: IVC venography demonstrates renal inflow at the mid L2 level. There is no venous anomaly. There is no evidence of mega cava. Findings were corroborated with the accompanying CT abdomen. The final image demonstrates a deployed IVC filter with its tip at the lower L2 endplate. IMPRESSION: Successful infrarenal IVC filter placement. This is a temporary filter. It can be removed or remain in place to become permanent. PLAN: This IVC filter is potentially retrievable. The patient will be assessed for filter retrieval by Interventional Radiology in approximately 8-12 weeks. Further recommendations regarding filter retrieval, continued surveillance or declaration of device permanence, will be made at that time. Electronically Signed   By: Marybelle Killings M.D.   On: 03/09/2018 12:15   Dg Abd Acute 2+v W 1v  Chest  Result Date: 02/26/2018 CLINICAL DATA:  Lapse in memory. EXAM:  DG ABDOMEN ACUTE W/ 1V CHEST COMPARISON:  None. FINDINGS: There is no evidence of dilated bowel loops or free intraperitoneal air. No radiopaque calculi or other significant radiographic abnormality is seen. Heart size and mediastinal contours are within normal limits. Both lungs are clear. Calcified tortuous aorta. Degenerative RIGHT SI joint. Port-A-Cath, RIGHT chest subclavian approach, tip RIGHT atrium. IMPRESSION: Negative abdominal radiographs. No acute cardiopulmonary disease. Electronically Signed   By: Staci Righter M.D.   On: 02/26/2018 18:16   Dg Swallowing Func-speech Pathology  Result Date: 03/06/2018 Objective Swallowing Evaluation: Type of Study: MBS-Modified Barium Swallow Study  Patient Details Name: ESSENCE MERLE MRN: [id number] Date of Birth: [birth year] Today's Date: 03/06/2018 Time: SLP Start Time (ACUTE ONLY): 0825 -SLP Stop Time (ACUTE ONLY): 0840 SLP Time Calculation (min) (ACUTE ONLY): 15 min Past Medical History: Past Medical History: Diagnosis Date . Anemia  . Anginal pain ([place])   used NTG x 2 May 31 and 06/15/13  . Anxiety  . Arthritis  . B12 deficiency 12/04/2014 . Breast cancer ([place])  . Complication of anesthesia  . COPD (chronic obstructive pulmonary disease) ([place])  . 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 Depression  . Dizziness  . Dyspnea  . Fibromyalgia  . Fibromyalgia  . GERD (gastroesophageal reflux disease)  . Headache(784.0)  . Heart murmur  . Hemoptysis 10/21/2015 . History of blood transfusion   last one May 12  . Hx of cardiovascular stress test   LexiScan with low level exercise Myoview (02/2013): No ischemia, EF 72%; normal study . Hx of echocardiogram   a.  Echocardiogram (12/26/2012): EF 32-99%, grade 1 diastolic dysfunction;   b.  Echocardiogram (02/2013): EF 55-60%, no WMA, trivial effusion . Hyperkalemia  . Hyponatremia  . Hypotension  . Hypothyroidism  . Mucositis  . Multiple myeloma  .  Myocardial infarction [facility])   in past, patient was unaware.  . Neuropathy  . Nodule of left lung 09/13/2015 . Pain in joint, pelvic region and thigh 07/07/2015 . Pneumonia   several . PONV (postoperative nausea and vomiting) 2008  after mastestomy Past Surgical History: Past Surgical History: Procedure Laterality Date . ABDOMINAL HYSTERECTOMY  1981 . AV FISTULA PLACEMENT Left 06/19/2013  Procedure: CREATION OF LEFT ARM ARTERIOVENOUS (AV) FISTULA ;  Surgeon: Angelia Mould, MD;  Location: [place];  Service: Vascular;  Laterality: Left; . BREAST RECONSTRUCTION   . BREAST SURGERY Right   reduction . CATARACT EXTRACTION, BILATERAL   . CHOLECYSTECTOMY  1971 . COLONOSCOPY   . EYE SURGERY Bilateral   lens implant . history of Port removal   . LUNG BIOPSY  10/21/2015 . MASTECTOMY Left 2008 . PORTACATH PLACEMENT  12/2012  has had 2 . RESECTION OF ARTERIOVENOUS FISTULA ANEURYSM Left 06/04/2017  Procedure: LIGATION ANEURYSM OF LEFT ARTERIOVENOUS FISTULA;  Surgeon: Angelia Mould, MD;  Location: [place];  Service: Vascular;  Laterality: Left; . Status post stem cell transplant on September 28, 2008.   HPI: Patient is a 78 y/o female presents with confusion, amnesia and HA. Head CT- left occipital and left frontal parietial infarcts. MRI- multiple posterior/anterior bilateral infarcts. PMH includes multiple myeloma currently getting chemo, MI, COPD, hypotension, breast ca, fibromyalgia.  Subjective: pleasant Assessment / Plan / Recommendation CHL IP CLINICAL IMPRESSIONS 03/06/2018 Clinical Impression Pt demonstrated reduced lingual retraction which resulted in mild vallecular residue and transient penetration (PAS 2) with thin liquids when consecutive swallows were used. [name] independent use of dry secondary swallows was effective in reducing the residue and residue was eliminated  with use of aliquid wash. Overall, her swallow mechanism appears to be within functional limits with consideration of her age and she does not  appear to be at significant risk of aspiration at this time. It is recommended that a regular texture diet be continued but, with consideration of her reported xerostomia, liquid washes may be beneficial to improve bolus flow. SLP will continue to follow for cognitive-linguisitic treatment and will see the pt once more for swallowing to ensure her observance of compensatory strategies. SLP Visit Diagnosis Dysphagia, unspecified (R13.10) Attention and concentration deficit following -- Frontal lobe and executive function deficit following -- Impact on safety and function No limitations   CHL IP TREATMENT RECOMMENDATION 03/06/2018 Treatment Recommendations Therapy as outlined in treatment plan below   Prognosis 03/06/2018 Prognosis for Safe Diet Advancement Good Barriers to Reach Goals Cognitive deficits Barriers/Prognosis Comment -- CHL IP DIET RECOMMENDATION 03/06/2018 SLP Diet Recommendations Regular solids;Thin liquid Liquid Administration via Cup;Straw Medication Administration Whole meds with liquid Compensations Small sips/bites;Slow rate;Follow solids with liquid Postural Changes Remain semi-upright after after feeds/meals (Comment);Seated upright at 90 degrees   No flowsheet data found.  CHL IP FOLLOW UP RECOMMENDATIONS 03/06/2018 Follow up Recommendations Inpatient Rehab   CHL IP FREQUENCY AND DURATION 03/06/2018 Speech Therapy Frequency (ACUTE ONLY) min 2x/week Treatment Duration 2 weeks      CHL IP ORAL PHASE 03/06/2018 Oral Phase WFL Oral - Pudding Teaspoon -- Oral - Pudding Cup -- Oral - Honey Teaspoon -- Oral - Honey Cup -- Oral - Nectar Teaspoon -- Oral - Nectar Cup -- Oral - Nectar Straw -- Oral - Thin Teaspoon -- Oral - Thin Cup -- Oral - Thin Straw -- Oral - Puree -- Oral - Mech Soft -- Oral - Regular -- Oral - Multi-Consistency -- Oral - Pill -- Oral Phase - Comment --  CHL IP PHARYNGEAL PHASE 03/06/2018 Pharyngeal Phase WFL Pharyngeal- Pudding Teaspoon -- Pharyngeal -- Pharyngeal- Pudding Cup --  Pharyngeal -- Pharyngeal- Honey Teaspoon -- Pharyngeal -- Pharyngeal- Honey Cup -- Pharyngeal -- Pharyngeal- Nectar Teaspoon -- Pharyngeal -- Pharyngeal- Nectar Cup -- Pharyngeal -- Pharyngeal- Nectar Straw -- Pharyngeal -- Pharyngeal- Thin Teaspoon -- Pharyngeal -- Pharyngeal- Thin Cup Penetration/Aspiration during swallow Pharyngeal Material enters airway, remains ABOVE vocal cords then ejected out Pharyngeal- Thin Straw Penetration/Aspiration during swallow Pharyngeal Material enters airway, remains ABOVE vocal cords then ejected out Pharyngeal- Puree Penetration/Aspiration before swallow;Penetration/Aspiration during swallow;Pharyngeal residue - valleculae Pharyngeal Material enters airway, remains ABOVE vocal cords then ejected out Pharyngeal- Mechanical Soft -- Pharyngeal -- Pharyngeal- Regular Pharyngeal residue - valleculae Pharyngeal -- Pharyngeal- Multi-consistency -- Pharyngeal -- Pharyngeal- Pill -- Pharyngeal -- Pharyngeal Comment --  Shanika I. Hardin Negus, [place], [place] Office number [phone] Pager [place] 03/06/2018, 9:42 AM              Mr Virgel [place] Wo Contrast  Result Date: 03/03/2018 CLINICAL DATA:  Stroke follow-up EXAM: MRA HEAD WITHOUT CONTRAST TECHNIQUE: Angiographic images of the Circle of Willis were obtained using MRA technique without intravenous contrast. COMPARISON:  Brain MRI 03/03/2018 FINDINGS: POSTERIOR CIRCULATION: --Basilar artery: Normal. --Posterior cerebral arteries: Normal. Both originate from the basilar artery. --Superior cerebellar arteries: Normal. --Inferior cerebellar arteries: Normal anterior and posterior inferior cerebellar arteries. ANTERIOR CIRCULATION: --Intracranial internal carotid arteries: Normal. --Anterior cerebral arteries: Normal. Both A1 segments are present. Patent anterior communicating artery. --Middle cerebral arteries: Normal. --Posterior communicating arteries: Absent bilaterally. IMPRESSION:  Normal intracranial MRA. Electronically Signed   By: Cletus Gash.D.  On: 03/03/2018 22:51   Ct Head Code Stroke Wo Contrast`  Result Date: 03/12/2018 CLINICAL DATA:  Code stroke.  Slurred speech. EXAM: CT HEAD WITHOUT CONTRAST TECHNIQUE: Contiguous axial images were obtained from the base of the skull through the vertex without intravenous contrast. COMPARISON:  Head CT 03/05/2018 and MRI 03/03/2018 FINDINGS: Brain: Evolving subacute infarcts are again seen, small to moderate in size in the left parietal lobe and small in the left occipital lobe without evidence of interval infarct extension. Small right parietal and cerebellar infarcts on MRI are not well demonstrated. A small chronic left cerebellar infarct is again noted. No new infarct, intracranial hemorrhage, mass, midline shift, or extra-axial fluid collection is identified. The ventricles and sulci are within normal limits for age. There is mild chronic small vessel ischemic disease affecting the cerebral white matter bilaterally. Vascular: Calcified atherosclerosis at the skull base. No hyperdense vessel. Skull: No fracture or focal osseous lesion. Sinuses/Orbits: Visualized paranasal sinuses and mastoid air cells are clear. Bilateral cataract extraction is noted. Other: None. ASPECTS [facility] Stroke Program Early CT Score) Not scored given recent evolving infarcts. IMPRESSION: Evolving subacute infarcts as above without evidence of new intracranial abnormality. These results were communicated to Dr. Erlinda Hong at 2:53 pm on 03/12/2018 by text page via the [facility] messaging system. Electronically Signed   By: Logan Bores M.D.   On: 03/12/2018 14:53   Vas US Carotid (at [place] Only)  Result Date: 03/06/2018 Carotid Arterial Duplex Study Indications: CVA. Performing Technologist: Oliver Hum RVT  Examination Guidelines: A complete evaluation includes B-mode imaging, spectral Doppler, color Doppler, and power Doppler as needed of all accessible  portions of each vessel. Bilateral testing is considered an integral part of a complete examination. Limited examinations for reoccurring indications may be performed as noted.  Right Carotid Findings: +----------+--------+-------+--------+----------------------+------------------+           PSV cm/sEDV    StenosisDescribe              Comments                             cm/s                                                    +----------+--------+-------+--------+----------------------+------------------+ CCA Prox  68      18                                   intimal thickening +----------+--------+-------+--------+----------------------+------------------+ CCA Distal77      20                                   intimal thickening +----------+--------+-------+--------+----------------------+------------------+ ICA Prox  72      21             smooth and  heterogenous                             +----------+--------+-------+--------+----------------------+------------------+ ICA Distal70      27                                                      +----------+--------+-------+--------+----------------------+------------------+ ECA       63      13                                                      +----------+--------+-------+--------+----------------------+------------------+ +----------+--------+-------+--------+-------------------+           PSV cm/sEDV cmsDescribeArm Pressure (mmHG) +----------+--------+-------+--------+-------------------+ Subclavian69                                         +----------+--------+-------+--------+-------------------+ +---------+--------+--+--------+-+---------+ VertebralPSV cm/s31EDV cm/s9Antegrade +---------+--------+--+--------+-+---------+  Left Carotid Findings: +----------+--------+--------+--------+-----------------------+--------+            PSV cm/sEDV cm/sStenosisDescribe               Comments +----------+--------+--------+--------+-----------------------+--------+ CCA Prox  82      23              smooth and heterogenous         +----------+--------+--------+--------+-----------------------+--------+ CCA Distal64      19              smooth and heterogenous         +----------+--------+--------+--------+-----------------------+--------+ ICA Prox  66      20              smooth and heterogenous         +----------+--------+--------+--------+-----------------------+--------+ ICA Distal81      32                                              +----------+--------+--------+--------+-----------------------+--------+ ECA       70      17                                              +----------+--------+--------+--------+-----------------------+--------+ +----------+--------+--------+--------+-------------------+ SubclavianPSV cm/sEDV cm/sDescribeArm Pressure (mmHG) +----------+--------+--------+--------+-------------------+           96                                          +----------+--------+--------+--------+-------------------+ +---------+--------+--+--------+--+---------+ VertebralPSV cm/s54EDV cm/s19Antegrade +---------+--------+--+--------+--+---------+  Summary: Right Carotid: Velocities in the right ICA are consistent with a 1-39% stenosis. Left Carotid: Velocities in the left ICA are consistent with a 1-39% stenosis. Vertebrals: Bilateral vertebral arteries demonstrate antegrade flow. *See table(s) above for measurements and observations.  Electronically signed by Antony Contras MD on 03/06/2018 at 11:57:28 AM.    Final  Vas [place] Lower Extremity Venous (dvt)  Result Date: 03/07/2018  Lower Venous Study Other Indications: Stroke with PFO. Performing Technologist: June Leap RDMS, RVT  Examination Guidelines: A complete evaluation includes B-mode imaging, spectral Doppler, color  Doppler, and power Doppler as needed of all accessible portions of each vessel. Bilateral testing is considered an integral part of a complete examination. Limited examinations for reoccurring indications may be performed as noted.  Right Venous Findings: +---------+---------------+---------+-----------+----------+-------------------+          CompressibilityPhasicitySpontaneityPropertiesSummary             +---------+---------------+---------+-----------+----------+-------------------+ CFV      Full           Yes      Yes                                      +---------+---------------+---------+-----------+----------+-------------------+ SFJ      Full                                                             +---------+---------------+---------+-----------+----------+-------------------+ FV Prox  Full                                                             +---------+---------------+---------+-----------+----------+-------------------+ FV Mid   Full                                                             +---------+---------------+---------+-----------+----------+-------------------+ FV DistalFull                                                             +---------+---------------+---------+-----------+----------+-------------------+ PFV      Full                                                             +---------+---------------+---------+-----------+----------+-------------------+ POP      Full           Yes      Yes                                      +---------+---------------+---------+-----------+----------+-------------------+ PTV      Full  not well visualized +---------+---------------+---------+-----------+----------+-------------------+ PERO     Full                                         not well visualized  +---------+---------------+---------+-----------+----------+-------------------+  Left Venous Findings: +---------+---------------+---------+-----------+----------+------------------+          CompressibilityPhasicitySpontaneityPropertiesSummary            +---------+---------------+---------+-----------+----------+------------------+ CFV      Full           Yes      Yes                                     +---------+---------------+---------+-----------+----------+------------------+ SFJ      Full                                                            +---------+---------------+---------+-----------+----------+------------------+ FV Prox  Full                                                            +---------+---------------+---------+-----------+----------+------------------+ FV Mid   Full                                                            +---------+---------------+---------+-----------+----------+------------------+ FV DistalFull                                                            +---------+---------------+---------+-----------+----------+------------------+ PFV      Full                                                            +---------+---------------+---------+-----------+----------+------------------+ POP      Full           Yes      Yes                                     +---------+---------------+---------+-----------+----------+------------------+ PTV      None                                         isolated proximal  calf               +---------+---------------+---------+-----------+----------+------------------+ PERO     None                                         isolated proximal                                                        calf               +---------+---------------+---------+-----------+----------+------------------+     Summary: Right: There is no evidence of deep vein thrombosis in the lower extremity. No cystic structure found in the popliteal fossa. Left: Findings consistent with acute deep vein thrombosis involving the left posterior tibial vein, and left peroneal vein. No cystic structure found in the popliteal fossa.  *See table(s) above for measurements and observations. Electronically signed by Deitra Mayo MD on 03/07/2018 at 7:51:58 PM.    Final    Dg Esophagus W Single Cm (sol Or Thin Ba)  Result Date: 03/06/2018 CLINICAL DATA:  Dysphagia EXAM: ESOPHOGRAM/BARIUM SWALLOW TECHNIQUE: Single contrast examination was performed using thick and thin barium FLUOROSCOPY TIME:  Fluoroscopy Time:  1 minutes, 48 seconds Radiation Exposure Index (if provided by the fluoroscopic device): 17.9 mGy Number of Acquired Spot Images: None COMPARISON:  CT chest from 01/20/2018 FINDINGS: The patient was relatively frail, and I elected not to give gas crystals. With patient laying down, swallows were performed in the LPO position. Mild distal esophageal fold thickening. No obvious ulceration. Mildly dilated esophagus. Primary peristaltic waves in the esophagus were disrupted on 4/4 swallows in the mid esophagus level. Initial swallows demonstrate some lobularity and irregularity along the upper margin of the gastroesophageal junction for example on image 35/1. Tumor or ulceration not readily excluded given this appearance. In the LPO position, there is poor filling of the gastroesophageal junction, with only slow percolated shin of contrast from the dilated esophagus into the stomach. A 13 mm barium tablet passed without difficulty into the stomach. Limited assessment of the upper stomach demonstrates no appreciable gastric diverticulum along the right margin of the upper stomach, raising concern that the rounded density in this vicinity on the recent chest CT from 01/20/2018 measuring 3.6 by 2.6 cm on that exam probably represents  gastrohepatic ligament pathologic adenopathy rather than a diverticulum. IMPRESSION: 1. Nonspecific esophageal dysmotility disorder with disruption of primary peristaltic waves in the mid esophagus. 2. Distal esophageal irregularity potentially with ulceration. Difficult to exclude tumor of the distal esophagus. Endoscopy is recommended. 3. 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 Electronically Signed   By: Van Clines M.D.   On: 03/06/2018 09:31     ASSESSMENT/PLAN:   This is a pleasant 78 year old female with:  1.  Multiple myeloma.  She has been on and off treatment for several years.  Most recently she was treated with Carfilzomib, Cytoxan, and dexamethasone.  Treatment is currently on hold.  2.  Newly diagnosed gastric adenocarcinoma.  I discussed  the biopsy with the patient and her daughter today.  I discussed with them that our initial plan was to consult general surgery, however, given that she had another stroke, will hold off on this consult at this time.  The patient does have abdominal adenopathy which could represent locally advanced or possibly metastatic disease.  May need to consider outpatient chemotherapy but will discuss this further when she is discharged.  3.  Stroke and recent diagnosis of acute DVT.  Anticoagulation was held due to concern for GI bleed, anemia, and thrombocytopenia.  An IVC filter was placed.  The patient was discharged to inpatient rehabilitation on aspirin 325 mg daily.  I have discussed with Dr. Julien Nordmann treatment with anticoagulation.  The patient still remains at high risk for bleeding however risk for recurrent stroke and hypercoagulable state is also a concern.  The benefit of anticoagulation may outweigh the  risk of bleeding.  Dr. Julien Nordmann would recommend initiating anticoagulation with close monitoring of the patient's hemoglobin.   LOS: 1 day   Mikey Bussing, DNP, AGPCNP-BC, AOCNP 03/13/18

## 2018-03-13 NOTE — Evaluation (Signed)
Clinical/Bedside Swallow Evaluation Patient Details  Name: Nancy Norris MRN: [id number] Date of Birth: 23-Jan-1940  Today's Date: 03/13/2018 Time: SLP Start Time (ACUTE ONLY): 2595 SLP Stop Time (ACUTE ONLY): 0957 SLP Time Calculation (min) (ACUTE ONLY): 13 min  Past Medical History:  Past Medical History:  Diagnosis Date  . Anemia   . Anginal pain ([place])    used NTG x 2 May 31 and 06/15/13   . Anxiety   . Arthritis   . B12 deficiency 12/04/2014  . Breast cancer ([place])   . Complication of anesthesia   . COPD (chronic obstructive pulmonary disease) ([place])   . 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 Depression   . Dizziness   . Dyspnea   . Fibromyalgia   . Fibromyalgia   . GERD (gastroesophageal reflux disease)   . Headache(784.0)   . Heart murmur   . Hemoptysis 10/21/2015  . History of blood transfusion    last one May 12   . Hx of cardiovascular stress test    LexiScan with low level exercise Myoview (02/2013): No ischemia, EF 72%; normal study  . Hx of echocardiogram    a.  Echocardiogram (12/26/2012): EF 63-87%, grade 1 diastolic dysfunction;   b.  Echocardiogram (02/2013): EF 55-60%, no WMA, trivial effusion  . Hyperkalemia   . Hyponatremia   . Hypotension   . Hypothyroidism   . Mucositis   . Multiple myeloma   . Myocardial infarction [facility])    in past, patient was unaware.   . Neuropathy   . Nodule of left lung 09/13/2015  . Pain in joint, pelvic region and thigh 07/07/2015  . Pneumonia    several  . PONV (postoperative nausea and vomiting) 2008   after mastestomy   Past Surgical History:  Past Surgical History:  Procedure Laterality Date  . ABDOMINAL HYSTERECTOMY  1981  . AV FISTULA PLACEMENT Left 06/19/2013   Procedure: CREATION OF LEFT ARM ARTERIOVENOUS (AV) FISTULA ;  Surgeon: Angelia Mould, MD;  Location: [place];  Service: Vascular;  Laterality: Left;  . BIOPSY  03/08/2018   Procedure: BIOPSY;  Surgeon: Wilford Corner, MD;  Location: [place];   Service: Endoscopy;;  . BREAST RECONSTRUCTION    . BREAST SURGERY Right    reduction  . CATARACT EXTRACTION, BILATERAL    . CHOLECYSTECTOMY  1971  . COLONOSCOPY    . ESOPHAGOGASTRODUODENOSCOPY (EGD) WITH PROPOFOL N/A 03/08/2018   Procedure: ESOPHAGOGASTRODUODENOSCOPY (EGD) WITH PROPOFOL;  Surgeon: Wilford Corner, MD;  Location: [place];  Service: Endoscopy;  Laterality: N/A;  . EYE SURGERY Bilateral    lens implant  . history of Port removal    . IR IVC FILTER PLMT / S&I /IMG GUID/MOD SED  03/09/2018  . LUNG BIOPSY  10/21/2015  . MASTECTOMY Left 2008  . PORTACATH PLACEMENT  12/2012   has had 2  . RESECTION OF ARTERIOVENOUS FISTULA ANEURYSM Left 06/04/2017   Procedure: LIGATION ANEURYSM OF LEFT ARTERIOVENOUS FISTULA;  Surgeon: Angelia Mould, MD;  Location: [place];  Service: Vascular;  Laterality: Left;  . Status post stem cell transplant on September 28, 2008.    . TEE WITHOUT CARDIOVERSION N/A 03/07/2018   Procedure: TRANSESOPHAGEAL ECHOCARDIOGRAM (TEE);  Surgeon: Buford Dresser, MD;  Location: [place];  Service: Cardiovascular;  Laterality: N/A;   HPI:  Nancy Norris is a 78 y.o. Caucasian female with PMH of remote breast cancer, COPD, CKD stage IV on chemo therapy, MI/CAD, multiple myeloma in remission, recently discharged from [facility] to  CIR for bilateral cortical and subcortical infarcts. Around 1405 pt was found not able to speak with significant right sided weakness.  CT no acute change but subacute infarcts. Per chart CTA head and neck showed left M2 high grade stenosis vs. Near occlusive thrombus and she went for emergent mechanical thrombectomy. MBS 2/20 flash penetration with recommendation for regular/thin liquids.   Assessment / Plan / Recommendation Clinical Impression  No focal CN impairments present with functional facial ROM and sensation. Required tactile cue to assist water reaching lips. Oral containment reduced with mild leak from right with  water. Rotary mastication pattern with adequate transit and self check for oral residue. She was unable to ingest 3 oz water consecutively however no s/s aspiration across multiple straw sips thin. Eructated following sips thin. [name] volitional cough is strong. Recommend regular texture, thin liquids, straws allowed, pills with thin (if small) and may need set up assist with meals. She will not need follow up for swallow.   SLP Visit Diagnosis: Dysphagia, unspecified (R13.10)    Aspiration Risk  Mild aspiration risk    Diet Recommendation Regular;Thin liquid   Liquid Administration via: Cup;Straw Medication Administration: Whole meds with liquid Supervision: Patient able to self feed;Intermittent supervision to cue for compensatory strategies;Staff to assist with self feeding(if needed) Compensations: Slow rate;Small sips/bites Postural Changes: Seated upright at 90 degrees    Other  Recommendations Oral Care Recommendations: Oral care BID   Follow up Recommendations Inpatient Rehab      Frequency and Duration min 2x/week  2 weeks       Prognosis Prognosis for Safe Diet Advancement: Good Barriers to Reach Goals: Other (Comment)(aphasia)      Swallow Study   General HPI: Nancy Norris is a 78 y.o. Caucasian female with PMH of remote breast cancer, COPD, CKD stage IV on chemo therapy, MI/CAD, multiple myeloma in remission, recently discharged from [facility] to CIR for bilateral cortical and subcortical infarcts. Around 1405 pt was found not able to speak with significant right sided weakness.  CT no acute change but subacute infarcts. Per chart CTA head and neck showed left M2 high grade stenosis vs. Near occlusive thrombus and she went for emergent mechanical thrombectomy. MBS 2/20 flash penetration with recommendation for regular/thin liquids. Type of Study: Bedside Swallow Evaluation Previous Swallow Assessment: (see HPI) Diet Prior to this Study: NPO Temperature Spikes Noted:  No Respiratory Status: Room air History of Recent Intubation: Yes Length of Intubations (days): (for procedure) Date extubated: (imm after procedure) Behavior/Cognition: Alert;Cooperative;Pleasant mood;Requires cueing Oral Cavity Assessment: Other (comment)(lingual contusion) Oral Care Completed by SLP: Recent completion by staff Oral Cavity - Dentition: Adequate natural dentition Vision: Functional for self-feeding Self-Feeding Abilities: Able to feed self;Needs assist;Needs set up Patient Positioning: Upright in bed Baseline Vocal Quality: Normal Volitional Cough: Strong Volitional Swallow: Able to elicit    Oral/Motor/Sensory Function Overall Oral Motor/Sensory Function: Within functional limits   Ice Chips Ice chips: Not tested   Thin Liquid Thin Liquid: Within functional limits Presentation: Cup;Straw    Nectar Thick Nectar Thick Liquid: Not tested   Honey Thick Honey Thick Liquid: Not tested   Puree Puree: Within functional limits   Solid     Solid: Within functional limits      Houston Siren 03/13/2018,10:20 AM   Orbie Pyo Colvin Caroli.Ed Risk analyst [phone] Office [phone]

## 2018-03-13 NOTE — Progress Notes (Signed)
Referring Physician(s): Dr. Rosalin Hawking  Supervising Physician: Luanne Bras  Patient Status:  [facility] - In-pt  Chief Complaint: CODE STROKE 2/26 s/p successful left common carotid arteriogram with complete revascularization of occluded left MCA, distal M1 with 2 passes with 5 mm x 33 mm embotrap retriever achieving TICI 3 revascularization with Dr. Estanislado Pandy.   Subjective:  78 y/o F with past medical history significant for remote breast cancer, COPD, CKD IV, multiple myeloma on chemotherapy, MI, CAD, bilateral cortical and subcortical infarcts who was recently discharged to CIR from [facility]. She had been doing well in CIR until yesterday at 1405 when she was noted to be unable to speak with significant right sided weakness, left sided gaze preference and right facial droop. Code stroke was called - CTA head/neck showed left M2 high grade stenosis vs near occlusive thrombus. She was taken to [facility] for emergent mechanical thrombectomy which was performed successfully by Dr. Estanislado Pandy. She was successfully extubated post procedure and is currently in the neuro ICU.  Patient seen today with daughter and RN at bedside. She reports she feels good, has no complaints except her arm is sore from the IVs. Her daughter and RN both report some difficulties with speech - she has been noted to have trouble finding words and speech is somewhat slower than previously. Her daughter also reports that she seems to start off talking about one topic before switching to another. She is also concerned that her mother has had two strokes in a very short amount of time and that she was told that she could not take anticoagulants because of a mass found in her stomach, they are awaiting the final pathology of this mass. She recently had an IVC filter placed 03/09/18 by Dr. Barbie Banner in IR.  Allergies: Codeine; Latex; Chocolate; Onion; Zyprexa [olanzapine]; Adhesive [tape]; Heparin; Hydrocodone; Iodinated diagnostic agents;  Oxycodone; Promethazine-dm; and Sulfa antibiotics  Medications: Prior to Admission medications   Medication Sig Start Date End Date Taking? Authorizing Provider  acetaminophen (TYLENOL) 500 MG tablet Take 2 tablets (1,000 mg total) by mouth daily as needed for moderate pain or headache. 03/11/18   Florencia Reasons, MD  albuterol (PROVENTIL HFA;VENTOLIN HFA) 108 (90 Base) MCG/ACT inhaler Inhale 1-2 puffs into the lungs every 6 (six) hours as needed for wheezing or shortness of breath (cough). 01/10/18   Garnet Sierras, NP  aspirin EC 325 MG tablet Take 1 tablet (325 mg total) by mouth daily. 03/11/18   Florencia Reasons, MD  atorvastatin (LIPITOR) 20 MG tablet Take 1 tablet (20 mg total) by mouth daily at 6 PM. 03/11/18   Florencia Reasons, MD  azelastine (ASTELIN) 0.1 % nasal spray Place 1 spray into both nostrils daily. Use in each nostril as directed 01/10/18   Garnet Sierras, NP  buPROPion (WELLBUTRIN) 75 MG tablet Take 1 tablet (75 mg total) by mouth 2 (two) times daily. 05/29/17 05/29/18  Liane Comber, NP  citalopram (CELEXA) 40 MG tablet Take 1 tablet daily for Mood 10/18/17   [name], MD  cromolyn (OPTICROM) 4 % ophthalmic solution INSTILL 1 TO 2 DROPS IN AFFECTED EYE(S) 4 TIMES A DAY FOR ALLERGIES. Patient taking differently: Place 1-2 drops into both eyes 4 (four) times daily as needed (allergies).  12/27/17   Liane Comber, NP  feeding supplement, ENSURE ENLIVE, (ENSURE ENLIVE) LIQD Take 237 mLs by mouth 2 (two) times daily between meals. 03/11/18   Florencia Reasons, MD  fluconazole (DIFLUCAN) 150 MG tablet Take one tablet at onset of  symptoms and second tablet three days later for yeast. Patient not taking: Reported on 03/03/2018 01/10/18   Garnet Sierras, NP  folic acid (FOLVITE) 1 MG tablet Take 1 tablet (1 mg total) by mouth daily. 03/19/17 03/19/18  Kinnie Feil, MD  gabapentin (NEURONTIN) 300 MG capsule TAKE 1 CAPSULE BY MOUTH 3 TIMES DAILY. Patient taking differently: Take 300-600 mg by mouth See  admin instructions. 600 mg at bedtime and an additional 300 mg during the day if "cramping" 02/17/18   [name], MD  ipratropium-albuterol (DUONEB) 0.5-2.5 (3) MG/3ML SOLN Take 3 mLs by nebulization every 4 (four) hours as needed. Max:6 doses per day 01/10/18   Garnet Sierras, NP  levocetirizine (XYZAL) 5 MG tablet Take 1 tablet (5 mg total) by mouth every evening. 01/10/18 01/11/19  Garnet Sierras, NP  levothyroxine (SYNTHROID, LEVOTHROID) 150 MCG tablet TAKE 1 TABLET BY MOUTH DAILY. NEED OFFICE VISIT FOR FURTHER REFILLS. Patient taking differently: Take by mouth daily before breakfast. TAKE 1 TABLET BY MOUTH DAILY. NEED OFFICE VISIT FOR FURTHER REFILLS. 10/03/17   [name], MD  loperamide (IMODIUM) 2 MG capsule Take 1 capsule (2 mg total) by mouth as needed for diarrhea or loose stools. 02/28/16   [name], MD  magic mouthwash SOLN Take 5 mLs by mouth 4 (four) times daily as needed for mouth pain. 10/27/15   Curt Bears, MD  midodrine (PROAMATINE) 10 MG tablet Take 1 tablet daily to support BP Patient taking differently: Take 10 mg by mouth daily.  12/03/17   [name], MD  montelukast (SINGULAIR) 10 MG tablet Take 1 tablet daily for Allergies 10/18/17   [name], MD  nitroGLYCERIN (NITROSTAT) 0.4 MG SL tablet Place 0.4 mg under the tongue every 5 (five) minutes as needed for chest pain.     [provider]  nystatin (MYCOSTATIN/NYSTOP) powder Apply topically 2 (two) times daily. 01/10/18   McClanahan, Danton Sewer, NP  ondansetron (ZOFRAN ODT) 8 MG disintegrating tablet Take 1 tablet (8 mg total) by mouth every 8 (eight) hours as needed for nausea or vomiting. 12/17/17   Curt Bears, MD  pantoprazole (PROTONIX) 40 MG tablet Take 1 tablet (40 mg total) by mouth daily. 03/11/18   Florencia Reasons, MD  pilocarpine (SALAGEN) 5 MG tablet TAKE 1 TABLET BY MOUTH 3 TIMES DAILY FOR DRY MOUTH. Patient taking differently: Take 5 mg by mouth 2 (two) times daily.  09/26/16    [name], MD  ranitidine (ZANTAC) 300 MG tablet Take 1 tablet 2 x /day at [facility] & Bedtime for Heart burn & Reflux Patient taking differently: Take 300 mg by mouth 2 (two) times daily.  09/16/17   [name], MD  sucralfate (CARAFATE) 1 g tablet Take 1 tablet dissolved in water  4 x /day BEFORE Meals & Bedtime for Acid indigestion & Reflux Patient taking differently: Take 1 g by mouth 4 (four) times daily -  with meals and at bedtime. Take 1 tablet dissolved in water  4 x /day BEFORE Meals & Bedtime for Acid indigestion & Reflux 10/04/17   [name], MD  traMADol (ULTRAM) 50 MG tablet Take 1 to 2 tablets by mouth 4 times a day as needed for pain. Patient taking differently: Take 50-100 mg by mouth 4 (four) times daily as needed for moderate pain. Take 1 to 2 tablets by mouth 4 times a day as needed for pain. 07/22/15   [name], MD  umeclidinium-vilanterol (ANORO ELLIPTA) 62.5-25 MCG/INH AEPB Inhale 1 puff into the lungs  daily as needed (shortness of breath). Patient not taking: Reported on 03/03/2018 01/10/18   Garnet Sierras, NP  vitamin B-12 (CYANOCOBALAMIN) 1000 MCG tablet Take 1 tablet (1,000 mcg total) by mouth daily. Patient taking differently: Take 2,000 mcg by mouth daily.  03/19/17   Kinnie Feil, MD     Vital Signs: BP 107/61   Pulse 66   Temp 97.6 F (36.4 C) (Oral)   Resp 14   SpO2 99%   Physical Exam Vitals signs and nursing note reviewed.  Constitutional:      General: She is not in acute distress.    Comments: Daughter and RN at bedside during exam. Patient sitting up in bed, pleasant, able to answer all questions appropriately however does have some slowed speech and difficulty finding words.   HENT:     Head: Normocephalic.  Cardiovascular:     Rate and Rhythm: Normal rate.     Comments: (+) right sided port - accessed, dressed appropriately. Pulmonary:     Effort: Pulmonary effort is normal.  Abdominal:     General: There is no  distension.     Palpations: Abdomen is soft.  Skin:    General: Skin is warm and dry.     Comments: (+) right groin puncture site clean, dry, dressed appropriately, soft, non-tender, non-erythematous, no hematoma or active bleeding noted.   Neurological:     Mental Status: She is alert.   Alert, awake, and oriented x3 Speech and comprehension intact - notable for difficulty with word finding; able to state name, location, month and when asked year repeats "20.Marland Kitchen.." but cannot finish the rest of the year. She states she knows the year but cannot get the words out. PERRL bilaterally EOMs without nystagmus or subjective diplopia. Visual fields not assessed Very minimal right facial droop noted. Tongue midline Motor power 5/5 in all 4 extremities; moving all 4 extremities spontaneously negative pronator drift. Fine motor and coordination in tact Gait not assessed Romberg not assessed Heel to toe not assessed Distal pulses palpable bilaterally.   Imaging: Ct Angio Head W Or Wo Contrast  Result Date: 03/12/2018 CLINICAL DATA:  Stroke, follow-up. Recent acute nonhemorrhagic infarcts involving the medial left occipital lobe and bilateral parietal lobes. Punctate acute infarct in the posterior left frontal lobe. EXAM: CT ANGIOGRAPHY HEAD AND NECK TECHNIQUE: Multidetector CT imaging of the head and neck was performed using the standard protocol during bolus administration of intravenous contrast. Multiplanar CT image reconstructions and MIPs were obtained to evaluate the vascular anatomy. Carotid stenosis measurements (when applicable) are obtained utilizing NASCET criteria, using the distal internal carotid diameter as the denominator. CONTRAST:  30m ISOVUE-370 IOPAMIDOL (ISOVUE-370) INJECTION 76% COMPARISON:  MRI of the brain and MRA [place] 03/03/2018. CT head without contrast 03/12/2018. FINDINGS: CTA NECK FINDINGS Aortic arch: A 3 vessel arch configuration is present. Atherosclerotic  changes are noted at the origin of the left subclavian artery without significant stenosis. Additional calcifications are present in the distal arch. There is no aneurysm. Right carotid system: The right common carotid artery is tortuous proximally. Minimal atherosclerotic changes are noted at the right carotid bifurcation. There is no significant stenosis. There is mild tortuosity of the cervical right ICA without significant stenosis. Left carotid system: The left common carotid artery is mildly tortuous without significant stenosis. The bifurcation is unremarkable. There is moderate tortuosity of the more distal cervical left ICA without a significant stenosis. Vertebral arteries: The left vertebral artery is slightly dominant to the right. Both  vertebral arteries originate from the subclavian arteries without significant stenosis. There is no significant stenosis or vascular injury to either vertebral artery in the neck. Skeleton: Levoconvex curvature is present in the lower cervical spine. Grade 1 degenerative anterolisthesis is present at C4-5. Uncovertebral disease is present bilaterally at C5-6 and C6-7. Other neck: The soft tissues of the neck demonstrate moderate atrophy of the thyroid. Salivary glands are within normal limits bilaterally. Mediastinal and thoracic inlet adenopathy again noted, recent described on CT of the chest. Upper chest: A ring-like lesion in the right upper lobe measures 18 mm, stable. No other focal nodules are visualized. Centrilobular emphysematous changes are present. Review of the MIP images confirms the above findings CTA HEAD FINDINGS Anterior circulation: Atherosclerotic calcifications are present within the cavernous internal carotid arteries bilaterally. There is no significant stenosis relative to the ICA termini. The A1 and M1 segments are normal. Anterior communicating artery is patent. MCA bifurcations are intact bilaterally. There is a high-grade stenosis or  nonocclusive thrombus involving the superior left M2 division proximally. Posterior circulation: Left vertebral artery is dominant. PICA origins are visualized and normal. Both posterior cerebral arteries originate from basilar tip. A left posterior communicating artery contributes. PCA branch vessels are within normal limits bilaterally. Venous sinuses: Dural sinuses are patent. Straight sinus deep cerebral veins are intact. Cortical veins are within normal limits. Anatomic variants: None Review of the MIP images confirms the above findings IMPRESSION: 1. High-grade stenosis versus nonocclusive thrombus involving the proximal left superior M2 division. 2. No other focal stenosis or occlusion. 3. No aneurysm. 4. Tortuosity of the cervical vasculature without significant stenosis. 5. Aortic Atherosclerosis (ICD10-I70.0). No aneurysm or stenosis at the aortic arch. Electronically Signed   By: San Morelle M.D.   On: 03/12/2018 15:37   Ct Angio Neck W Or Wo Contrast  Result Date: 03/12/2018 CLINICAL DATA:  Stroke, follow-up. Recent acute nonhemorrhagic infarcts involving the medial left occipital lobe and bilateral parietal lobes. Punctate acute infarct in the posterior left frontal lobe. EXAM: CT ANGIOGRAPHY HEAD AND NECK TECHNIQUE: Multidetector CT imaging of the head and neck was performed using the standard protocol during bolus administration of intravenous contrast. Multiplanar CT image reconstructions and MIPs were obtained to evaluate the vascular anatomy. Carotid stenosis measurements (when applicable) are obtained utilizing NASCET criteria, using the distal internal carotid diameter as the denominator. CONTRAST:  26m ISOVUE-370 IOPAMIDOL (ISOVUE-370) INJECTION 76% COMPARISON:  MRI of the brain and MRA [place] 03/03/2018. CT head without contrast 03/12/2018. FINDINGS: CTA NECK FINDINGS Aortic arch: A 3 vessel arch configuration is present. Atherosclerotic changes are noted at the origin  of the left subclavian artery without significant stenosis. Additional calcifications are present in the distal arch. There is no aneurysm. Right carotid system: The right common carotid artery is tortuous proximally. Minimal atherosclerotic changes are noted at the right carotid bifurcation. There is no significant stenosis. There is mild tortuosity of the cervical right ICA without significant stenosis. Left carotid system: The left common carotid artery is mildly tortuous without significant stenosis. The bifurcation is unremarkable. There is moderate tortuosity of the more distal cervical left ICA without a significant stenosis. Vertebral arteries: The left vertebral artery is slightly dominant to the right. Both vertebral arteries originate from the subclavian arteries without significant stenosis. There is no significant stenosis or vascular injury to either vertebral artery in the neck. Skeleton: Levoconvex curvature is present in the lower cervical spine. Grade 1 degenerative anterolisthesis is present at C4-5. Uncovertebral disease is present  bilaterally at C5-6 and C6-7. Other neck: The soft tissues of the neck demonstrate moderate atrophy of the thyroid. Salivary glands are within normal limits bilaterally. Mediastinal and thoracic inlet adenopathy again noted, recent described on CT of the chest. Upper chest: A ring-like lesion in the right upper lobe measures 18 mm, stable. No other focal nodules are visualized. Centrilobular emphysematous changes are present. Review of the MIP images confirms the above findings CTA HEAD FINDINGS Anterior circulation: Atherosclerotic calcifications are present within the cavernous internal carotid arteries bilaterally. There is no significant stenosis relative to the ICA termini. The A1 and M1 segments are normal. Anterior communicating artery is patent. MCA bifurcations are intact bilaterally. There is a high-grade stenosis or nonocclusive thrombus involving the  superior left M2 division proximally. Posterior circulation: Left vertebral artery is dominant. PICA origins are visualized and normal. Both posterior cerebral arteries originate from basilar tip. A left posterior communicating artery contributes. PCA branch vessels are within normal limits bilaterally. Venous sinuses: Dural sinuses are patent. Straight sinus deep cerebral veins are intact. Cortical veins are within normal limits. Anatomic variants: None Review of the MIP images confirms the above findings IMPRESSION: 1. High-grade stenosis versus nonocclusive thrombus involving the proximal left superior M2 division. 2. No other focal stenosis or occlusion. 3. No aneurysm. 4. Tortuosity of the cervical vasculature without significant stenosis. 5. Aortic Atherosclerosis (ICD10-I70.0). No aneurysm or stenosis at the aortic arch. Electronically Signed   By: San Morelle M.D.   On: 03/12/2018 15:37   Ct Head Code Stroke Wo Contrast`  Result Date: 03/12/2018 CLINICAL DATA:  Code stroke.  Slurred speech. EXAM: CT HEAD WITHOUT CONTRAST TECHNIQUE: Contiguous axial images were obtained from the base of the skull through the vertex without intravenous contrast. COMPARISON:  Head CT 03/05/2018 and MRI 03/03/2018 FINDINGS: Brain: Evolving subacute infarcts are again seen, small to moderate in size in the left parietal lobe and small in the left occipital lobe without evidence of interval infarct extension. Small right parietal and cerebellar infarcts on MRI are not well demonstrated. A small chronic left cerebellar infarct is again noted. No new infarct, intracranial hemorrhage, mass, midline shift, or extra-axial fluid collection is identified. The ventricles and sulci are within normal limits for age. There is mild chronic small vessel ischemic disease affecting the cerebral white matter bilaterally. Vascular: Calcified atherosclerosis at the skull base. No hyperdense vessel. Skull: No fracture or focal osseous  lesion. Sinuses/Orbits: Visualized paranasal sinuses and mastoid air cells are clear. Bilateral cataract extraction is noted. Other: None. ASPECTS [facility] Stroke Program Early CT Score) Not scored given recent evolving infarcts. IMPRESSION: Evolving subacute infarcts as above without evidence of new intracranial abnormality. These results were communicated to Dr. Erlinda Hong at 2:53 pm on 03/12/2018 by text page via the [facility] messaging system. Electronically Signed   By: Logan Bores M.D.   On: 03/12/2018 14:53    Labs:  CBC: Recent Labs    03/08/18 0532 03/09/18 0423 03/11/18 0500 03/12/18 0820  WBC 2.7* 3.3* 3.7* 4.7  HGB 7.3* 7.2* 8.6* 9.6*  HCT 24.1* 23.5* 26.9* 30.9*  PLT 72* 67* 52* 58*    COAGS: Recent Labs    03/03/18 1243 03/09/18 0423  INR 0.90 1.01    BMP: Recent Labs    03/08/18 0532 03/09/18 0423 03/11/18 0500 03/12/18 0820  NA 140 137 140 138  K 3.6 3.6 3.9 3.9  CL 108 105 108 101  CO2 21* _0 GLUCOSE 95 102* 105* 112*  BUN _0 CALCIUM 8.2* 8.3* 8.8* 9.2  CREATININE 2.47* 2.34* 2.13* 2.39*  GFRNONAA 18* 20* 22* 19*  GFRAA 21* 23* 25* 22*    LIVER FUNCTION TESTS: Recent Labs    02/26/18 1636 03/03/18 1243 03/04/18 0500 03/12/18 0820  BILITOT 0.7 1.1 0.4 0.7  AST _1 ALT _2 ALKPHOS 74 83 61 76  PROT 5.1* 5.6* 4.6* 5.4*  ALBUMIN 3.0* 3.5 2.7* 3.2*    Assessment and Plan:  78 y/o F with previous stroke 03/03/18 who had been progressing well in CIR however noted to have stroke like symptoms on 2/26 for which code stroke was called. She is s/p successful left common carotid arteriogram with complete revascularization of occluded left MCA, distal M1 with 2 passes with 5 mm x 33 mm embotrap retriever achieving TICI 3 revascularization with Dr. Estanislado Pandy on 2/26. Of note she had an IVC filter placed 02/2318 for LLE DVT due to high risk of bleeding with anticoagulation given her complicated history of ongoing multiple myeloma,  newly discovered fungating gastric tumor and new finding of PFO on TEE.  Neuro exam notable for word finding difficulties, slowed speech and very minimal right facial droop. Otherwise neurologically in tact, extubated successfully post procedure, groin puncture site unremarkable. Planned for speech, PT and OT consults as well as MRI/MRA brain per neurology.   Continue current management. Appreciate and agree with neurology management. IR will continue to follow, please call with questions or concerns.   Electronically Signed: Joaquim Nam, PA-C 03/13/2018, 9:18 AM   I spent a total of 25 Minutes at the the patient's bedside AND on the patient's hospital floor or unit, greater than 50% of which was counseling/coordinating care for follow up left common carotid thrombectomy.

## 2018-03-13 NOTE — Evaluation (Signed)
Occupational Therapy Evaluation Patient Details Name: Nancy Norris MRN: [id number] DOB: 07/16/1940 Today's Date: 03/13/2018    History of Present Illness AYSLIN KUNDERT is a 78 y.o. Caucasian female with PMH of remote breast cancer, COPD, CKD stage IV on chemo therapy, MI/CAD, multiple myeloma in remission, recently discharged from [facility] to CIR for bilateral cortical and subcortical infarcts. Around 1405 pt was found not able to speak with significant right sided weakness.  CT no acute change but subacute infarcts. Per chart CTA head and neck showed left M2 high grade stenosis vs. Near occlusive thrombus and she went for emergent mechanical thrombectomy.   Clinical Impression   Prior to hospital admission, Pt was independent in ADL and mobility - lived alone. Has daughter who is very supportive close by. Recent stay at [facility] where she was improving in independence in ADL and transfers/mobility. Today Pt was min A for bed mobility, min A +2 for transfers, R inattention during functional activities, min A for UB ADL and max A for LB ADL. She has delayed speech and trouble with word finding. Pt will require skilled OT in the acute setting as well as return to CIR level therapy to maximize safety and independence in ADL and functional transfers, and they will also be able to address cognition and safety for IADL.     Follow Up Recommendations  CIR;Supervision/Assistance - 24 hour    Equipment Recommendations  3 in 1 bedside commode    Recommendations for Other Services       Precautions / Restrictions Precautions Precautions: Fall Restrictions Weight Bearing Restrictions: No      Mobility Bed Mobility Overal bed mobility: Needs Assistance Bed Mobility: Supine to Sit     Supine to sit: HOB elevated;Min assist     General bed mobility comments: increased time, min A for line management - no physical assist required  Transfers Overall transfer level: Needs assistance Equipment used:  Rolling walker (2 wheeled) Transfers: Sit to/from Stand Sit to Stand: Min assist;+2 safety/equipment;From elevated surface         General transfer comment: min A for boost, vc for safe hand placement +2 for safety and line management    Balance Overall balance assessment: Needs assistance Sitting-balance support: Bilateral upper extremity supported;Feet unsupported Sitting balance-Leahy Scale: Good     Standing balance support: During functional activity Standing balance-Leahy Scale: Fair Standing balance comment: dependent on BUE support                           ADL either performed or assessed with clinical judgement   ADL Overall ADL's : Needs assistance/impaired Eating/Feeding: Minimal assistance Eating/Feeding Details (indicate cue type and reason): able to bring cup to mouth Grooming: Set up;Sitting Grooming Details (indicate cue type and reason): dependent in BUE in standing Upper Body Bathing: Moderate assistance   Lower Body Bathing: Maximal assistance   Upper Body Dressing : Moderate assistance;Sitting   Lower Body Dressing: Maximal assistance;Sit to/from stand   Toilet Transfer: Minimal assistance;+2 for safety/equipment;Ambulation;RW Toilet Transfer Details (indicate cue type and reason): simulated through recliner transfer [place] and Hygiene: Sit to/from stand;Moderate assistance       Functional mobility during ADLs: Minimal assistance;+2 for safety/equipment;Rolling walker;Cueing for safety General ADL Comments: R inattention during functional tasks and mobility     Vision Baseline Vision/History: Wears glasses Wears Glasses: At all times Patient Visual Report: No change from baseline Vision Assessment?: Yes Eye Alignment: Within  Functional Limits Ocular Range of Motion: Within Functional Limits Alignment/Gaze Preference: Within Defined Limits Tracking/Visual Pursuits: Able to track stimulus in all quads  without difficulty Visual Fields: Impaired-to be further tested in functional context;Right visual field deficit(hard to discern with delayed speech, better with hand squeez) Diplopia Assessment: (denies) Additional Comments: when cued she can perform, but during functional activities she has a slight right inattention vs missing visual information     Perception     Praxis      Pertinent Vitals/Pain Pain Assessment: No/denies pain Faces Pain Scale: Hurts a little bit Pain Intervention(s): Monitored during session;Repositioned     Hand Dominance Right   Extremity/Trunk Assessment Upper Extremity Assessment Upper Extremity Assessment: LUE deficits/detail;RUE deficits/detail RUE Deficits / Details: very small differences in weakness, decreased initiation; however able to sustain RUE at 90 degrees FF for 10 seconds RUE Sensation: WNL LUE Deficits / Details: history of L shoulder problems; seeing ortho for shoulder   Lower Extremity Assessment Lower Extremity Assessment: Defer to PT evaluation   Cervical / Trunk Assessment Cervical / Trunk Assessment: Normal   Communication Communication Communication: Expressive difficulties   Cognition Arousal/Alertness: Awake/alert Behavior During Therapy: WFL for tasks assessed/performed Overall Cognitive Status: Impaired/Different from baseline Area of Impairment: Safety/judgement;Awareness;Problem solving;Attention                   Current Attention Level: Selective     Safety/Judgement: Decreased awareness of deficits;Decreased awareness of safety Awareness: Emergent Problem Solving: Slow processing;Requires verbal cues General Comments: Pt required cues with navigating room with RW, trouble recalling where she worked (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[employer])   General Comments       Exercises     Shoulder Instructions      Home Living Family/patient expects to be discharged to:: Inpatient rehab Living  Arrangements: Alone Available Help at Discharge: Family;Available PRN/intermittently Type of Home: Apartment Home Access: Ramped entrance     Home Layout: One level     Bathroom Shower/Tub: Teacher, early years/pre: Standard Bathroom Accessibility: Yes How Accessible: Accessible via walker Home Equipment: [place] - 2 wheels;Cane - single point;Shower seat;Walker - 4 wheels      Lives With: Alone    Prior Functioning/Environment Level of Independence: Independent        Comments: PTA (initial): Rides [employer] for transportation; walks without DME. Does cooking/cleaning. ; cancer center provides rides; enjoys "diamond" painting; enjoys crafts; does her own Patent attorney        OT Problem List: Decreased activity tolerance;Impaired balance (sitting and/or standing);Impaired vision/perception;Decreased coordination;Decreased cognition;Decreased safety awareness;Decreased knowledge of use of DME or AE      OT Treatment/Interventions: Self-care/ADL training;Therapeutic exercise;DME and/or AE instruction;Therapeutic activities;Cognitive remediation/compensation;Visual/perceptual remediation/compensation;Patient/family education;Balance training    OT Goals(Current goals can be found in the care plan section) Acute Rehab OT Goals Patient Stated Goal: to return to independence OT Goal Formulation: With patient Time For Goal Achievement: 03/27/18 Potential to Achieve Goals: Good  OT Frequency: Min 2X/week   Barriers to D/C:    hopes to return to CIR       Co-evaluation PT/OT/SLP Co-Evaluation/Treatment: Yes Reason for Co-Treatment: Complexity of the patient's impairments (multi-system involvement);For patient/therapist safety;To address functional/ADL transfers PT goals addressed during session: Mobility/safety with mobility;Balance;Proper use of DME;Strengthening/ROM OT goals addressed during session: ADL's and self-care;Proper use of Adaptive  equipment and DME;Strengthening/ROM      AM-PAC OT "6 Clicks" Daily Activity     Outcome Measure  Help from another person eating meals?: A Little Help from another person taking care of personal grooming?: A Little Help from another person toileting, which includes using toliet, bedpan, or urinal?: A Little Help from another person bathing (including washing, rinsing, drying)?: A Little Help from another person to put on and taking off regular upper body clothing?: A Little Help from another person to put on and taking off regular lower body clothing?: A Little 6 Click Score: 18   End of Session Equipment Utilized During Treatment: Gait belt;Rolling walker Nurse Communication: Mobility status  Activity Tolerance: Patient tolerated treatment well Patient left: in chair;with call bell/phone within reach;with chair alarm set;with family/visitor present  OT Visit Diagnosis: Unsteadiness on feet (R26.81);Low vision, both eyes (H54.2);Other symptoms and signs involving cognitive function;Dizziness and giddiness (R42)                Time: 8472-0721 OT Time Calculation (min): 29 min Charges:  OT General Charges $OT Visit: 1 Visit OT Evaluation $OT Eval Moderate Complexity: [name] OTR/L Acute Rehabilitation Services Pager: [phone] Office: [place] 03/13/2018, 12:46 PM

## 2018-03-13 NOTE — Progress Notes (Addendum)
STROKE TEAM PROGRESS NOTE   SUBJECTIVE (INTERVAL HISTORY) Her daughter is at the bedside.  Overall her condition is rapidly improving. She still has word finding difficulty and disorientation but language much improved. Right side weakness resolved. No gaze preference anymore. CBC BMP pending.   OBJECTIVE Temp:  [97.2 F (36.2 C)-97.6 F (36.4 C)] 97.6 F (36.4 C) (02/27 0800) Pulse Rate:  [55-72] 68 (02/27 0900) Cardiac Rhythm: Normal sinus rhythm (02/27 0800) Resp:  [9-20] 16 (02/27 0900) BP: (76-138)/(42-83) 104/60 (02/27 0900) SpO2:  [91 %-100 %] 98 % (02/27 0900) Arterial Line BP: (108-156)/(47-70) 136/53 (02/27 0900)  Recent Labs  Lab 03/12/18 1420 03/12/18 1943 03/12/18 2325 03/13/18 0352 03/13/18 0802  GLUCAP 97 128* 156* 157* 145*   Recent Labs  Lab 03/08/18 0532 03/09/18 0423 03/10/18 0608 03/11/18 0500 03/12/18 0820 03/13/18 1000  NA 140 137  --  140 138 138  K 3.6 3.6  --  3.9 3.9 4.1  CL 108 105  --  108 101 108  CO2 21* 23  --  _0 GLUCOSE 95 102*  --  105* 112* 129*  BUN 14 13  --  _1 CREATININE 2.47* 2.34*  --  2.13* 2.39* 2.25*  CALCIUM 8.2* 8.3*  --  8.8* 9.2 8.3*  MG  --   --  2.2  --   --   --    Recent Labs  Lab 03/12/18 0820  AST 19  ALT 10  ALKPHOS 76  BILITOT 0.7  PROT 5.4*  ALBUMIN 3.2*   Recent Labs  Lab 03/07/18 0550 03/08/18 0532 03/09/18 0423 03/11/18 0500 03/12/18 0820  WBC 2.3* 2.7* 3.3* 3.7* 4.7  NEUTROABS  --  1.2* 1.6* 2.4 2.5  HGB 7.4* 7.3* 7.2* 8.6* 9.6*  HCT 23.3* 24.1* 23.5* 26.9* 30.9*  MCV 102.6* 103.4* 103.1* 101.5* 103.3*  PLT 72* 72* 67* 52* 58*   Recent Labs  Lab 03/10/18 1123 03/10/18 1735 03/10/18 2353  TROPONINI 2.06* 2.43* 2.15*   No results for input(s): LABPROT, INR in the last 72 hours. No results for input(s): COLORURINE, LABSPEC, [place], GLUCOSEU, HGBUR, BILIRUBINUR, KETONESUR, PROTEINUR, UROBILINOGEN, NITRITE, LEUKOCYTESUR in the last 72 hours.  Invalid input(s):  APPERANCEUR     Component Value Date/Time   CHOL 141 03/06/2018 0500   TRIG 151 (H) 03/06/2018 0500   HDL 26 (L) 03/06/2018 0500   CHOLHDL 5.4 03/06/2018 0500   VLDL 30 03/06/2018 0500   LDLCALC 85 03/06/2018 0500   LDLCALC 128 (H) 12/18/2017 1137   Lab Results  Component Value Date   HGBA1C 5.1 03/06/2018   No results found for: LABOPIA, COCAINSCRNUR, LABBENZ, AMPHETMU, THCU, LABBARB  No results for input(s): ETH in the last 168 hours.  I have personally reviewed the radiological images below and agree with the radiology interpretations.  Ct Abdomen Pelvis Wo Contrast  Result Date: 03/08/2018 CLINICAL DATA:  Large ulcerated gastric fundal mass found on upper endoscopy today. EXAM: CT CHEST, ABDOMEN AND PELVIS WITHOUT CONTRAST TECHNIQUE: Multidetector CT imaging of the chest, abdomen and pelvis was performed following the standard protocol without IV contrast. COMPARISON:  CT chest 01/20/2018 FINDINGS: CT CHEST FINDINGS Cardiovascular: The heart size is normal. No substantial pericardial effusion. Coronary artery calcification is evident. Atherosclerotic calcification is noted in the wall of the thoracic aorta. Right Port-A-Cath tip is positioned in the distal SVC near the junction with the RA. Mediastinum/Nodes: 12 mm short axis prevascular node seen on 17/3. Scattered small central mediastinal  lymph nodes identified in the upper mediastinum. 12 mm short axis retrotracheal node visible on 16/3. No subcarinal or lower paraesophageal lymphadenopathy. No evidence for gross hilar lymphadenopathy although assessment is limited by the lack of intravenous contrast on today's study. There is no axillary lymphadenopathy. Lungs/Pleura: The central tracheobronchial airways are patent. 19 mm ring-like lesion in the right upper lobe is stable and may represent airway impaction with associated tree-in-bud nodularity. 6 mm medial right upper lobe nodule visible on 67/4. 5 mm right middle lobe nodule visible  on 102/4, stable. 1.9 x 1.2 cm irregular opacity in the posterior left upper lobe is at the site of the 4.2 x 3.8 cm mass seen on PET-CT of 10/11/2015 and may reflect residual scarring. Small left pleural effusion evident. Musculoskeletal: No worrisome lytic or sclerotic osseous abnormality. CT ABDOMEN PELVIS FINDINGS Hepatobiliary: No focal abnormality in the liver on this study without intravenous contrast. Gallbladder surgically absent. No intrahepatic or extrahepatic biliary dilation. Pancreas: No focal mass lesion. No dilatation of the main duct. No intraparenchymal cyst. No peripancreatic edema. Spleen: Subtle 2.7 x 2.0 cm hypoattenuating lesion is identified in the dome of spleen, not definitely seen on prior. Adrenals/Urinary Tract: Right adrenal gland unremarkable. 1.6 cm left adrenal nodule stable since PET-CT of 10/01/2015. Cortical scarring noted in both kidneys. No evidence for hydroureter. The urinary bladder appears normal for the degree of distention. Stomach/Bowel: Tiny hiatal hernia noted. Wall thickening is noted in the esophagogastric junction and proximal stomach. Wall thickening extends along the medial aspect of the proximal stomach. Distal stomach unremarkable. Duodenum is normally positioned as is the ligament of Treitz. No small bowel wall thickening. No small bowel dilatation. The terminal ileum is normal. The appendix is not visualized, but there is no edema or inflammation in the region of the cecum. No gross colonic mass. No colonic wall thickening. Vascular/Lymphatic: There is abdominal aortic atherosclerosis without aneurysm. Bulky lymphadenopathy is seen in the hepato duodenal ligament. 3.2 x 4.5 cm lymph node seen on 55/3. The 2.9 x 2.8 cm lymph node seen on 57/3. 10 mm short axis portal caval lymph node (63/3) is suspicious. No para-aortic retroperitoneal lymphadenopathy. No pelvic sidewall lymphadenopathy. Reproductive: The uterus is surgically absent. There is no adnexal mass.  Other: No intraperitoneal free fluid. Musculoskeletal: No worrisome lytic or sclerotic osseous abnormality. IMPRESSION: 1. Marked wall thickening in the esophagogastric junction and proximal stomach, consistent with known neoplasm. Bulky lymphadenopathy in the gastrohepatic ligament is compatible with metastatic disease. 10 mm short axis portal caval lymph node also suspicious. 2. 2.7 cm hypoattenuating lesion in the spleen. This is not well assessed on today's noncontrast exam. Close attention recommended as metastatic disease not excluded. 3. Scattered tiny pulmonary nodules measuring up to 5 mm. Nonspecific, but close attention recommended as metastatic disease not excluded. 4. Mild lymphadenopathy noted in the upper mediastinum. 5. Circular lesion right upper lobe on axial imaging has a branching configuration on coronal and sagittal images. This is probably airway impaction related to atypical infection. Close follow-up recommended. 6. 1.9 x 1.2 cm irregular lesion posterior left upper lobe at the site of a 4 cm mass on PET-CT of 10/11/2015. Imaging features today are compatible with residua of that lesion. 7. 1.6 cm left adrenal nodule stable since 10/11/2015, likely benign. 8.  Aortic Atherosclerois (ICD10-170.0) Electronically Signed   By: Misty Stanley M.D.   On: 03/08/2018 19:19   Ct Angio Head W Or Wo Contrast  Result Date: 03/12/2018 CLINICAL DATA:  Stroke, follow-up.  Recent acute nonhemorrhagic infarcts involving the medial left occipital lobe and bilateral parietal lobes. Punctate acute infarct in the posterior left frontal lobe. EXAM: CT ANGIOGRAPHY HEAD AND NECK TECHNIQUE: Multidetector CT imaging of the head and neck was performed using the standard protocol during bolus administration of intravenous contrast. Multiplanar CT image reconstructions and MIPs were obtained to evaluate the vascular anatomy. Carotid stenosis measurements (when applicable) are obtained utilizing NASCET criteria, using  the distal internal carotid diameter as the denominator. CONTRAST:  48m ISOVUE-370 IOPAMIDOL (ISOVUE-370) INJECTION 76% COMPARISON:  MRI of the brain and MRA [facility] 03/03/2018. CT head without contrast 03/12/2018. FINDINGS: CTA NECK FINDINGS Aortic arch: A 3 vessel arch configuration is present. Atherosclerotic changes are noted at the origin of the left subclavian artery without significant stenosis. Additional calcifications are present in the distal arch. There is no aneurysm. Right carotid system: The right common carotid artery is tortuous proximally. Minimal atherosclerotic changes are noted at the right carotid bifurcation. There is no significant stenosis. There is mild tortuosity of the cervical right ICA without significant stenosis. Left carotid system: The left common carotid artery is mildly tortuous without significant stenosis. The bifurcation is unremarkable. There is moderate tortuosity of the more distal cervical left ICA without a significant stenosis. Vertebral arteries: The left vertebral artery is slightly dominant to the right. Both vertebral arteries originate from the subclavian arteries without significant stenosis. There is no significant stenosis or vascular injury to either vertebral artery in the neck. Skeleton: Levoconvex curvature is present in the lower cervical spine. Grade 1 degenerative anterolisthesis is present at C4-5. Uncovertebral disease is present bilaterally at C5-6 and C6-7. Other neck: The soft tissues of the neck demonstrate moderate atrophy of the thyroid. Salivary glands are within normal limits bilaterally. Mediastinal and thoracic inlet adenopathy again noted, recent described on CT of the chest. Upper chest: A ring-like lesion in the right upper lobe measures 18 mm, stable. No other focal nodules are visualized. Centrilobular emphysematous changes are present. Review of the MIP images confirms the above findings CTA HEAD FINDINGS Anterior circulation:  Atherosclerotic calcifications are present within the cavernous internal carotid arteries bilaterally. There is no significant stenosis relative to the ICA termini. The A1 and M1 segments are normal. Anterior communicating artery is patent. MCA bifurcations are intact bilaterally. There is a high-grade stenosis or nonocclusive thrombus involving the superior left M2 division proximally. Posterior circulation: Left vertebral artery is dominant. PICA origins are visualized and normal. Both posterior cerebral arteries originate from basilar tip. A left posterior communicating artery contributes. PCA branch vessels are within normal limits bilaterally. Venous sinuses: Dural sinuses are patent. Straight sinus deep cerebral veins are intact. Cortical veins are within normal limits. Anatomic variants: None Review of the MIP images confirms the above findings IMPRESSION: 1. High-grade stenosis versus nonocclusive thrombus involving the proximal left superior M2 division. 2. No other focal stenosis or occlusion. 3. No aneurysm. 4. Tortuosity of the cervical vasculature without significant stenosis. 5. Aortic Atherosclerosis (ICD10-I70.0). No aneurysm or stenosis at the aortic arch. Electronically Signed   By: CSan MorelleM.D.   On: 03/12/2018 15:37   Ct Head Wo Contrast  Result Date: 03/05/2018 CLINICAL DATA:  Headache beginning this afternoon. History of multiple myeloma, thrombocytopenia and stroke. EXAM: CT HEAD WITHOUT CONTRAST TECHNIQUE: Contiguous axial images were obtained from the base of the skull through the vertex without intravenous contrast. COMPARISON:  MRI of the head March 03, 2018 and CT HEAD March 03, 2018. FINDINGS: BRAIN: Patchy  LEFT > RIGHT parietal and LEFT occipital hypodensities are increasingly conspicuous from prior CT. No propagation. No intraparenchymal hemorrhage, mass effect or midline shift. Patchy LEFT frontal white matter hypodensities most compatible with chronic small  vessel ischemic changes. Old small LEFT cerebellar infarct. No parenchymal brain volume loss for age. No hydrocephalus. No abnormal extra-axial fluid collections. VASCULAR: Moderate calcific atherosclerosis of the carotid siphons. SKULL: No skull fracture. Moderate temporomandibular osteoarthrosis. No significant scalp soft tissue swelling. SINUSES/ORBITS: Trace paranasal sinus mucosal thickening. Mastoid air cells are well aerated.The included ocular globes and orbital contents are non-suspicious. Status post bilateral ocular lens implants. OTHER: None. IMPRESSION: 1. Involving acute LEFT > RIGHT parietal and LEFT occipital lobe infarcts without hemorrhagic conversion. 2. Mild-to-moderate chronic small vessel ischemic changes. Old small LEFT cerebellar infarct. Electronically Signed   By: Elon Alas M.D.   On: 03/05/2018 17:25   Ct Head Wo Contrast  Result Date: 03/03/2018 CLINICAL DATA:  Confused. Altered level of consciousness. Breast cancer. EXAM: CT HEAD WITHOUT CONTRAST TECHNIQUE: Contiguous axial images were obtained from the base of the skull through the vertex without intravenous contrast. COMPARISON:  02/26/2018. FINDINGS: Brain: Two separate areas of ill-defined abnormal cortical and subcortical edema in the LEFT hemisphere, affecting the LEFT parietal and LEFT occipital lobes, could represent multifocal infarcts versus rapid progression of metastatic disease. These areas were previously unremarkable on CT 5 days earlier. MRI of the brain without and with contrast recommended for further evaluation. Elsewhere, mild to moderate atrophy. Hypoattenuation of white matter, consistent with small vessel disease/post treatment effect. Vascular: Calcification of the cavernous internal carotid arteries consistent with cerebrovascular atherosclerotic disease. No signs of intracranial large vessel occlusion. Skull: Calvarium intact. Sinuses/Orbits: No sinus disease. Negative orbits. Other: None.  IMPRESSION: Two separate areas of abnormal cortical and subcortical edema in the LEFT hemisphere, affecting the LEFT parietal and LEFT occipital lobes, could represent multifocal infarcts versus rapid progression of metastatic disease. These were not present on the previous CT from 02/26/2018. MRI of the brain without and with contrast recommended for further evaluation. Electronically Signed   By: Staci Righter M.D.   On: 03/03/2018 13:26   Ct Head Wo Contrast  Result Date: 02/26/2018 CLINICAL DATA:  Blackouts for 1 month. History of multiple myeloma. EXAM: CT HEAD WITHOUT CONTRAST TECHNIQUE: Contiguous axial images were obtained from the base of the skull through the vertex without intravenous contrast. COMPARISON:  12/22/2015. FINDINGS: Brain: No evidence for acute infarction, hemorrhage, mass lesion, hydrocephalus, or extra-axial fluid. Mild atrophy. Hypoattenuation of white matter, consistent with small vessel disease or post treatment effect. 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 Vascular: Calcification of the cavernous internal carotid arteries consistent with cerebrovascular atherosclerotic disease. No signs of intracranial large vessel occlusion. Skull: Calvarium intact. Small lucency in the RIGHT frontal bone (series 3 image 50) unchanged from 2017. Sinuses/Orbits: No acute finding. Other: None. IMPRESSION: Atrophy and small vessel disease. No acute intracranial findings. Electronically Signed   By: Staci Righter M.D.   On: 02/26/2018 18:23   Ct Angio Neck W Or Wo Contrast  Result Date: 03/12/2018 CLINICAL DATA:  Stroke, follow-up. Recent acute nonhemorrhagic infarcts involving the medial left occipital lobe and bilateral parietal lobes. Punctate acute infarct in the posterior left frontal lobe. EXAM: CT ANGIOGRAPHY HEAD AND NECK TECHNIQUE: Multidetector CT imaging of the head and neck was performed using the standard protocol during bolus administration of intravenous contrast.  Multiplanar CT image reconstructions and MIPs were obtained to evaluate the vascular anatomy. Carotid  stenosis measurements (when applicable) are obtained utilizing NASCET criteria, using the distal internal carotid diameter as the denominator. CONTRAST:  51m ISOVUE-370 IOPAMIDOL (ISOVUE-370) INJECTION 76% COMPARISON:  MRI of the brain and MRA [facility] 03/03/2018. CT head without contrast 03/12/2018. FINDINGS: CTA NECK FINDINGS Aortic arch: A 3 vessel arch configuration is present. Atherosclerotic changes are noted at the origin of the left subclavian artery without significant stenosis. Additional calcifications are present in the distal arch. There is no aneurysm. Right carotid system: The right common carotid artery is tortuous proximally. Minimal atherosclerotic changes are noted at the right carotid bifurcation. There is no significant stenosis. There is mild tortuosity of the cervical right ICA without significant stenosis. Left carotid system: The left common carotid artery is mildly tortuous without significant stenosis. The bifurcation is unremarkable. There is moderate tortuosity of the more distal cervical left ICA without a significant stenosis. Vertebral arteries: The left vertebral artery is slightly dominant to the right. Both vertebral arteries originate from the subclavian arteries without significant stenosis. There is no significant stenosis or vascular injury to either vertebral artery in the neck. Skeleton: Levoconvex curvature is present in the lower cervical spine. Grade 1 degenerative anterolisthesis is present at C4-5. Uncovertebral disease is present bilaterally at C5-6 and C6-7. Other neck: The soft tissues of the neck demonstrate moderate atrophy of the thyroid. Salivary glands are within normal limits bilaterally. Mediastinal and thoracic inlet adenopathy again noted, recent described on CT of the chest. Upper chest: A ring-like lesion in the right upper lobe measures 18 mm,  stable. No other focal nodules are visualized. Centrilobular emphysematous changes are present. Review of the MIP images confirms the above findings CTA HEAD FINDINGS Anterior circulation: Atherosclerotic calcifications are present within the cavernous internal carotid arteries bilaterally. There is no significant stenosis relative to the ICA termini. The A1 and M1 segments are normal. Anterior communicating artery is patent. MCA bifurcations are intact bilaterally. There is a high-grade stenosis or nonocclusive thrombus involving the superior left M2 division proximally. Posterior circulation: Left vertebral artery is dominant. PICA origins are visualized and normal. Both posterior cerebral arteries originate from basilar tip. A left posterior communicating artery contributes. PCA branch vessels are within normal limits bilaterally. Venous sinuses: Dural sinuses are patent. Straight sinus deep cerebral veins are intact. Cortical veins are within normal limits. Anatomic variants: None Review of the MIP images confirms the above findings IMPRESSION: 1. High-grade stenosis versus nonocclusive thrombus involving the proximal left superior M2 division. 2. No other focal stenosis or occlusion. 3. No aneurysm. 4. Tortuosity of the cervical vasculature without significant stenosis. 5. Aortic Atherosclerosis (ICD10-I70.0). No aneurysm or stenosis at the aortic arch. Electronically Signed   By: CSan MorelleM.D.   On: 03/12/2018 15:37   Ct Chest Wo Contrast  Result Date: 03/08/2018 CLINICAL DATA:  Large ulcerated gastric fundal mass found on upper endoscopy today. EXAM: CT CHEST, ABDOMEN AND PELVIS WITHOUT CONTRAST TECHNIQUE: Multidetector CT imaging of the chest, abdomen and pelvis was performed following the standard protocol without IV contrast. COMPARISON:  CT chest 01/20/2018 FINDINGS: CT CHEST FINDINGS Cardiovascular: The heart size is normal. No substantial pericardial effusion. Coronary artery  calcification is evident. Atherosclerotic calcification is noted in the wall of the thoracic aorta. Right Port-A-Cath tip is positioned in the distal SVC near the junction with the RA. Mediastinum/Nodes: 12 mm short axis prevascular node seen on 17/3. Scattered small central mediastinal lymph nodes identified in the upper mediastinum. 12 mm short axis retrotracheal node visible on  16/3. No subcarinal or lower paraesophageal lymphadenopathy. No evidence for gross hilar lymphadenopathy although assessment is limited by the lack of intravenous contrast on today's study. There is no axillary lymphadenopathy. Lungs/Pleura: The central tracheobronchial airways are patent. 19 mm ring-like lesion in the right upper lobe is stable and may represent airway impaction with associated tree-in-bud nodularity. 6 mm medial right upper lobe nodule visible on 67/4. 5 mm right middle lobe nodule visible on 102/4, stable. 1.9 x 1.2 cm irregular opacity in the posterior left upper lobe is at the site of the 4.2 x 3.8 cm mass seen on PET-CT of 10/11/2015 and may reflect residual scarring. Small left pleural effusion evident. Musculoskeletal: No worrisome lytic or sclerotic osseous abnormality. CT ABDOMEN PELVIS FINDINGS Hepatobiliary: No focal abnormality in the liver on this study without intravenous contrast. Gallbladder surgically absent. No intrahepatic or extrahepatic biliary dilation. Pancreas: No focal mass lesion. No dilatation of the main duct. No intraparenchymal cyst. No peripancreatic edema. Spleen: Subtle 2.7 x 2.0 cm hypoattenuating lesion is identified in the dome of spleen, not definitely seen on prior. Adrenals/Urinary Tract: Right adrenal gland unremarkable. 1.6 cm left adrenal nodule stable since PET-CT of 10/01/2015. Cortical scarring noted in both kidneys. No evidence for hydroureter. The urinary bladder appears normal for the degree of distention. Stomach/Bowel: Tiny hiatal hernia noted. Wall thickening is noted in  the esophagogastric junction and proximal stomach. Wall thickening extends along the medial aspect of the proximal stomach. Distal stomach unremarkable. Duodenum is normally positioned as is the ligament of Treitz. No small bowel wall thickening. No small bowel dilatation. The terminal ileum is normal. The appendix is not visualized, but there is no edema or inflammation in the region of the cecum. No gross colonic mass. No colonic wall thickening. Vascular/Lymphatic: There is abdominal aortic atherosclerosis without aneurysm. Bulky lymphadenopathy is seen in the hepato duodenal ligament. 3.2 x 4.5 cm lymph node seen on 55/3. The 2.9 x 2.8 cm lymph node seen on 57/3. 10 mm short axis portal caval lymph node (63/3) is suspicious. No para-aortic retroperitoneal lymphadenopathy. No pelvic sidewall lymphadenopathy. Reproductive: The uterus is surgically absent. There is no adnexal mass. Other: No intraperitoneal free fluid. Musculoskeletal: No worrisome lytic or sclerotic osseous abnormality. IMPRESSION: 1. Marked wall thickening in the esophagogastric junction and proximal stomach, consistent with known neoplasm. Bulky lymphadenopathy in the gastrohepatic ligament is compatible with metastatic disease. 10 mm short axis portal caval lymph node also suspicious. 2. 2.7 cm hypoattenuating lesion in the spleen. This is not well assessed on today's noncontrast exam. Close attention recommended as metastatic disease not excluded. 3. Scattered tiny pulmonary nodules measuring up to 5 mm. Nonspecific, but close attention recommended as metastatic disease not excluded. 4. Mild lymphadenopathy noted in the upper mediastinum. 5. Circular lesion right upper lobe on axial imaging has a branching configuration on coronal and sagittal images. This is probably airway impaction related to atypical infection. Close follow-up recommended. 6. 1.9 x 1.2 cm irregular lesion posterior left upper lobe at the site of a 4 cm mass on PET-CT of  10/11/2015. Imaging features today are compatible with residua of that lesion. 7. 1.6 cm left adrenal nodule stable since 10/11/2015, likely benign. 8.  Aortic Atherosclerois (ICD10-170.0) Electronically Signed   By: Misty Stanley M.D.   On: 03/08/2018 19:19   Mr Brain Wo Contrast  Result Date: 03/03/2018 CLINICAL DATA:  Sudden onset confusion. EXAM: MRI HEAD WITHOUT CONTRAST TECHNIQUE: Multiplanar, multiecho pulse sequences of the brain and surrounding structures were  obtained without intravenous contrast. COMPARISON:  Head CT 03/03/2018 FINDINGS: The examination had to be discontinued prior to completion due to altered mental status and inability to follow the technologist's instructions. Coronal and axial diffusion-weighted imaging, sagittal T1-weighted imaging and axial T2-weighted imaging were obtained. There is multifocal abnormal diffusion restriction within both hemispheres, including the left frontal lobe, both parietal lobes, left occipital lobe and both cerebellar hemispheres. The largest area of diffusion abnormalities in the posterior left parietal lobe. There is no midline shift or other mass effect. The midline structures are normal. There is moderate white matter hyperintense T2-weighted signal consistent with chronic microvascular ischemia. IMPRESSION: 1. Examination discontinued early due to patient altered mental status. 2. Multifocal bilateral acute ischemia within multiple vascular territories in both hemispheres. This may indicate a central cardiac or aortic embolic source. Electronically Signed   By: Ulyses Jarred M.D.   On: 03/03/2018 15:04   [name] Head Wo Contrast  Result Date: 03/03/2018 CLINICAL DATA:  Stroke follow-up EXAM: MRA HEAD WITHOUT CONTRAST TECHNIQUE: Angiographic images of the Circle of Willis were obtained using MRA technique without intravenous contrast. COMPARISON:  Brain MRI 03/03/2018 FINDINGS: POSTERIOR CIRCULATION: --Basilar artery: Normal. --Posterior cerebral  arteries: Normal. Both originate from the basilar artery. --Superior cerebellar arteries: Normal. --Inferior cerebellar arteries: Normal anterior and posterior inferior cerebellar arteries. ANTERIOR CIRCULATION: --Intracranial internal carotid arteries: Normal. --Anterior cerebral arteries: Normal. Both A1 segments are present. Patent anterior communicating artery. --Middle cerebral arteries: Normal. --Posterior communicating arteries: Absent bilaterally. IMPRESSION: Normal intracranial MRA. Electronically Signed   By: Ulyses Jarred M.D.   On: 03/03/2018 22:51   Ct Head Code Stroke Wo Contrast`  Result Date: 03/12/2018 CLINICAL DATA:  Code stroke.  Slurred speech. EXAM: CT HEAD WITHOUT CONTRAST TECHNIQUE: Contiguous axial images were obtained from the base of the skull through the vertex without intravenous contrast. COMPARISON:  Head CT 03/05/2018 and MRI 03/03/2018 FINDINGS: Brain: Evolving subacute infarcts are again seen, small to moderate in size in the left parietal lobe and small in the left occipital lobe without evidence of interval infarct extension. Small right parietal and cerebellar infarcts on MRI are not well demonstrated. A small chronic left cerebellar infarct is again noted. No new infarct, intracranial hemorrhage, mass, midline shift, or extra-axial fluid collection is identified. The ventricles and sulci are within normal limits for age. There is mild chronic small vessel ischemic disease affecting the cerebral white matter bilaterally. Vascular: Calcified atherosclerosis at the skull base. No hyperdense vessel. Skull: No fracture or focal osseous lesion. Sinuses/Orbits: Visualized paranasal sinuses and mastoid air cells are clear. Bilateral cataract extraction is noted. Other: None. ASPECTS [facility] Stroke Program Early CT Score) Not scored given recent evolving infarcts. IMPRESSION: Evolving subacute infarcts as above without evidence of new intracranial abnormality. These results were  communicated to Dr. Erlinda Hong at 2:53 pm on 03/12/2018 by text page via the [facility] messaging system. Electronically Signed   By: Logan Bores M.D.   On: 03/12/2018 14:53   Vas US Carotid (at [place] Only)  Result Date: 03/06/2018 Carotid Arterial Duplex Study Indications: CVA. Performing Technologist: Oliver Hum RVT  Examination Guidelines: A complete evaluation includes B-mode imaging, spectral Doppler, color Doppler, and power Doppler as needed of all accessible portions of each vessel. Bilateral testing is considered an integral part of a complete examination. Limited examinations for reoccurring indications may be performed as noted.  Right Carotid Findings: +----------+--------+-------+--------+----------------------+------------------+           PSV cm/sEDV  StenosisDescribe              Comments                             cm/s                                                    +----------+--------+-------+--------+----------------------+------------------+ CCA Prox  68      18                                   intimal thickening +----------+--------+-------+--------+----------------------+------------------+ CCA Distal77      20                                   intimal thickening +----------+--------+-------+--------+----------------------+------------------+ ICA Prox  72      21             smooth and                                                                heterogenous                             +----------+--------+-------+--------+----------------------+------------------+ ICA Distal70      27                                                      +----------+--------+-------+--------+----------------------+------------------+ ECA       63      13                                                      +----------+--------+-------+--------+----------------------+------------------+ +----------+--------+-------+--------+-------------------+            PSV cm/sEDV cmsDescribeArm Pressure (mmHG) +----------+--------+-------+--------+-------------------+ [id number]                                         +----------+--------+-------+--------+-------------------+ +---------+--------+--+--------+-+---------+ VertebralPSV cm/s31EDV cm/s9Antegrade +---------+--------+--+--------+-+---------+  Left Carotid Findings: +----------+--------+--------+--------+-----------------------+--------+           PSV cm/sEDV cm/sStenosisDescribe               Comments +----------+--------+--------+--------+-----------------------+--------+ CCA Prox  82      23              smooth and heterogenous         +----------+--------+--------+--------+-----------------------+--------+ CCA Distal64      19              smooth and heterogenous         +----------+--------+--------+--------+-----------------------+--------+  ICA Prox  66      20              smooth and heterogenous         +----------+--------+--------+--------+-----------------------+--------+ ICA Distal81      32                                              +----------+--------+--------+--------+-----------------------+--------+ ECA       70      17                                              +----------+--------+--------+--------+-----------------------+--------+ +----------+--------+--------+--------+-------------------+ SubclavianPSV cm/sEDV cm/sDescribeArm Pressure (mmHG) +----------+--------+--------+--------+-------------------+           96                                          +----------+--------+--------+--------+-------------------+ +---------+--------+--+--------+--+---------+ VertebralPSV cm/s54EDV cm/s19Antegrade +---------+--------+--+--------+--+---------+  Summary: Right Carotid: Velocities in the right ICA are consistent with a 1-39% stenosis. Left Carotid: Velocities in the left ICA are consistent with a 1-39% stenosis.  Vertebrals: Bilateral vertebral arteries demonstrate antegrade flow. *See table(s) above for measurements and observations.  Electronically signed by Antony Contras Nancy on 03/06/2018 at 11:57:28 AM.    Final    Vas [place] Lower Extremity Venous (dvt)  Result Date: 03/07/2018  Lower Venous Study Other Indications: Stroke with PFO. Performing Technologist: June Leap RDMS, RVT  Examination Guidelines: A complete evaluation includes B-mode imaging, spectral Doppler, color Doppler, and power Doppler as needed of all accessible portions of each vessel. Bilateral testing is considered an integral part of a complete examination. Limited examinations for reoccurring indications may be performed as noted.  Right Venous Findings: +---------+---------------+---------+-----------+----------+-------------------+          CompressibilityPhasicitySpontaneityPropertiesSummary             +---------+---------------+---------+-----------+----------+-------------------+ CFV      Full           Yes      Yes                                      +---------+---------------+---------+-----------+----------+-------------------+ SFJ      Full                                                             +---------+---------------+---------+-----------+----------+-------------------+ FV Prox  Full                                                             +---------+---------------+---------+-----------+----------+-------------------+ FV Mid   Full                                                             +---------+---------------+---------+-----------+----------+-------------------+  FV DistalFull                                                             +---------+---------------+---------+-----------+----------+-------------------+ PFV      Full                                                             +---------+---------------+---------+-----------+----------+-------------------+ POP       Full           Yes      Yes                                      +---------+---------------+---------+-----------+----------+-------------------+ PTV      Full                                         not well visualized +---------+---------------+---------+-----------+----------+-------------------+ PERO     Full                                         not well visualized +---------+---------------+---------+-----------+----------+-------------------+  Left Venous Findings: +---------+---------------+---------+-----------+----------+------------------+          CompressibilityPhasicitySpontaneityPropertiesSummary            +---------+---------------+---------+-----------+----------+------------------+ CFV      Full           Yes      Yes                                     +---------+---------------+---------+-----------+----------+------------------+ SFJ      Full                                                            +---------+---------------+---------+-----------+----------+------------------+ FV Prox  Full                                                            +---------+---------------+---------+-----------+----------+------------------+ FV Mid   Full                                                            +---------+---------------+---------+-----------+----------+------------------+ FV DistalFull                                                            +---------+---------------+---------+-----------+----------+------------------+  PFV      Full                                                            +---------+---------------+---------+-----------+----------+------------------+ POP      Full           Yes      Yes                                     +---------+---------------+---------+-----------+----------+------------------+ PTV      None                                         isolated proximal                                                         calf               +---------+---------------+---------+-----------+----------+------------------+ PERO     None                                         isolated proximal                                                        calf               +---------+---------------+---------+-----------+----------+------------------+    Summary: Right: There is no evidence of deep vein thrombosis in the lower extremity. No cystic structure found in the popliteal fossa. Left: Findings consistent with acute deep vein thrombosis involving the left posterior tibial vein, and left peroneal vein. No cystic structure found in the popliteal fossa.  *See table(s) above for measurements and observations. Electronically signed by Deitra Mayo Nancy on 03/07/2018 at 7:51:58 PM.    Final     PHYSICAL EXAM  Temp:  [97.2 F (36.2 C)-97.6 F (36.4 C)] 97.6 F (36.4 C) (02/27 0800) Pulse Rate:  [55-72] 68 (02/27 0900) Resp:  [9-20] 16 (02/27 0900) BP: (76-138)/(42-83) 104/60 (02/27 0900) SpO2:  [91 %-100 %] 98 % (02/27 0900) Arterial Line BP: (108-156)/(47-70) 136/53 (02/27 0900)  General - Well nourished, well developed, in no apparent distress.  Ophthalmologic - fundi not visualized due to noncooperation.  Cardiovascular - Regular rate and rhythm.  Neuro - awake alert eyes open, orientated to hospital, self, people, but not orientated to name of the hospital or time. Still has hesitation of speech, and word finding difficulties. However, she was able to name 3/4 and able to repeat. Following simple commands. PERRL, EOMI, no gaze preference, able to track both sides, visual field full, slight right nasolabial fold flattening. Tongue midline. BUE and BLE symmetrical and at least 4/5. Sensation symmetrical.  FTN intact but slower on the left. DTR 1+ and no babinski. Gait not tested.    ASSESSMENT/PLAN Nancy Norris is a 78 y.o. female with history  of remote breast cancer, COPD, CKD stage IV on chemotherapy, MI/CAD, multiple myeloma in remission, recent stroke admitted for acute onset global aphasia and right hemiplegia. No tPA given due to recent stroke and thrombocytopenia.    Stroke:  left MCA infarct due to left M2 high-grade stenosis status post IR with TICI3 reperfusion, embolic pattern likely secondary to hypercoagulable state  Resultant mild expressive aphasia  CT no acute abnormality  CT head and neck right M2 occlusion versus high-grade stenosis with distal reconstitution  IR distal right M1 occlusion with TICI3 reperfusion  MRI pending  MRA pending  2D Echo EF 60 to 65% on 03/10/2018  LDL 85  HgbA1c 5.1  None for VTE prophylaxis due to right LE DVT with IVC filter and anemia with thrombocytopenia  aspirin 325 mg daily prior to admission, now on ASA 319m.  However, will discuss with Dr. MJulien Nordmannregarding management of potential hypercoagulable state  Ongoing aggressive stroke risk factor management  Therapy recommendations:  Pending   Disposition:  Pending   ? Hypercoagulable state  03/03/2018 MRI showed multiple posterior and anterior bilateral infarcts.  LE venous Doppler showed right LE DVT, status post IVC filter  Left M2 thrombus on this admission status post IR  History of breast cancer, multiple myeloma and recent finding of GI mass  Concerning for hypercoagulable date  Will discuss with Dr. MJulien Nordmannregarding possible treatment with anticoagulation  Currently on aspirin 325  History of stroke  admitted on 03/03/18 for episodes of amnesia, confusion, and headache. CT showed left occipital and left frontal parietal infarcts. MRI confirmed multiple posterior and anterior bilateral infarcts. MRA normal, CUS unremarkable. EF 60-65%. TEE small PFO. LDL 85 and A1C 5.1. however, pt had pancytopenia, not AC candidate. No loop placed. She was discharged to CIR with ASA 3252mand lipitor 20.   Right LE  DVT  Confirmed on LE venous Doppler  Not a candidate for anticoagulation  Status post IVC filter  Malignancy  Remote history of breast cancer  Multiple myeloma in remission, last chemo a year ago  Recently under chemo for CKD stage IV  Recent finding of gastric mass  Following with Dr. MoJulien Nordmanns outpatient  Anemia and thrombocytopenia  Hemoglobin 8.6-9.6-7.1  Likely due to blood loss related procedure  Platelet 52-58-85  Status post 1 unit platelet transfusion  CBC monitoring  Currently on aspirin 325  Hypotension . Stable the lower end . BP goal 120 - 140 post procedure  Resume Midodrine home meds  Close BP monitoring  Hyperlipidemia  Home meds: Lipitor 20  LDL 85, goal < 70  Now Lipitor 20 resumed  Continue statin at discharge  CKD stage IV  Creatinine 2.47-2.34-2.13-2.39-2.25  Follows with outpatient nephrology  Close monitoring  Other Stroke Risk Factors  Advanced age  Former cigarette smoker, advised to stop smoking  Coronary artery disease/MI  Other Active Problems  Depression on Wellbutrin and Celexa  Hypothyroidism  COPD  Hospital day # 1  This patient is critically ill due to recent stroke, left M2 occlusion, pancytopenia, gastric mass, CKD stage IV and at significant risk of neurological worsening, death form recurrent stroke, hemorrhagic conversion, seizure, bleeding, thrombocytopenia. This patient's care requires constant monitoring of vital signs, hemodynamics, respiratory and cardiac monitoring, review of multiple databases, neurological assessment, discussion with family, other specialists  and medical decision making of high complexity. I spent 40 minutes of neurocritical care time in the care of this patient. I had long discussion with patient and the daughter at bedside, updated pt current condition, treatment plan and potential prognosis. They expressed understanding and appreciation.  I also discussed with Dr.  Lew Dawes PA in the hallway.    Nancy Hawking, Nancy Norris Stroke Neurology 03/13/2018 10:50 AM    To contact Stroke Continuity provider, please refer to [email or web address] After hours, contact General Neurology

## 2018-03-13 NOTE — Discharge Summary (Addendum)
NAME: Nancy Norris, GANGWER MEDICAL RECORD WC:[id number] ACCOUNT [id number] DATE OF BIRTH:05/12/1940 FACILITY: [facility] LOCATION: [facility] PHYSICIAN:Keymarion Bearman, MD  DISCHARGE SUMMARY  DATE OF DISCHARGE:  03/12/2018  DISCHARGE DIAGNOSES:   1.  Bilateral cortical and subcortical infarcts with suspect new left middle cerebral artery infarction. 2.  Acute deep venous thrombosis, left posterior tibial and left peroneal vein, status post inferior vena cava filter 03/09/2018 per interventional radiology. 3.  Pain management. 4.  Mood.   5.  History of multiple myeloma.  Findings of large fungating estimated proximal gastric mass suspicious for malignancy.  Pathology report pending. 6.  Non ST elevation myocardial infarction. 5.  Postural hypotension.   6.  Chronic kidney disease stage IV.   7.  Dysphagia  8.  Hypothyroidism. 9.  Hyperlipidemia.  HOSPITAL COURSE:  This is a 78 year old right-handed female with history of multiple myeloma followed by Dr. Earlie Server with  breast cancer, COPD, tobacco abuse 13 years ago, chronic anemia, thrombocytopenia.  CKD stage IV.  Lives alone.  Reportedly,  independent prior to admission.  Presented to 03/03/2018 with altered mental status, dysphagia and a lactic acid 1.2.  She was recently seen in the ED on 03/06/2018 with episodes of dizziness, headache as well as hemoglobin 7.9.  Noted chronic anemia.   Urine culture greater than 100,000 E. coli.  She was discharged to home after being placed on Keflex.  Upon this latest admission, repeated.  Cranial CT scan showed 2 separate areas of abnormal cortical and subcortical edema in the left hemisphere  affecting left parietal occipital lobe, not present on 02/26/2018.  MRI showed left greater than right and small to moderate sized multifocal acute infarcts.  Per report, acute ischemia within multiple vascular territories in both hemispheres.  MRA  unremarkable.  The patient did not receive tPA.  Echocardiogram  03/05/2018 showed ejection fraction 51%, normal systolic function.  Carotid Dopplers were no ICA stenosis.  Blood cultures positive for staph species, Proteus.  Started on vancomycin and  ceftriaxone to 03/06/2018.  An esophagram completed showing nonspecific esophageal dysmotility.  No appreciable upper gastric diverticulum.  Maintained on a regular diet.  TEE completed, ejection fraction 65%, no thrombus or vegetation small PFO seen by  color Doppler.  The patient remained on aspirin for CVA prophylaxis.  Gastroenterology services, call for abnormal barium swallow anemia, question GI bleed.  Upper endoscopy completed showing large fungating, malignant-appearing, ulcerated proximal  stomach mass, areas of blood consistent with recent bleeding.  Biopsies pending.  Recommendations were for CT of abdomen and pelvis completed showing marked wall thickening in the esophagus, the esophagogastric junction and proximal stomach consistent  with neoplasm.  Bulky lymphadenopathy and the gastrohepatic ligament compatible with metastatic disease.  There was a 2-point cm management as well as scattered tiny pulmonary nodules.  The posterior left upper lobe at the site of a 4 cm mass was also  noted on CAT scan 10/11/2015 .  Oncology service with followup with Dr. Earlie Server.  Pathology report pending.  Await biopsy report from gastric managed to get further recommendations about malignancy suspicious for malignancy.  Hospital course complicated by acute DVT, left posterior vein, left peroneal vein.  Radiology service is consulted and underwent IVC filter placement 03/09/2018.  On 03/10/2018, Cardiology service is consulted for nonspecific chest discomfort, dizziness,  elevated troponin.  EKG showed new Q waves Repeat echocardiogram showed ejection fraction 76%, normal systolic function, no increase in right ventricular wall thickness.  It was discussed the need for cardiac catheterization.  The patient is not  interested  at this time.  Therapy evaluations completed.  She was admitted for comprehensive rehabilitation.  PAST MEDICAL HISTORY:  See discharge diagnoses.  SOCIAL HISTORY:  The patient lives alone, independent prior to admission.  FUNCTIONAL STATUS:  Upon admission to rehab services was minimal assist 20 feet for ambulation, minimal assist sit to stand, min mod assist with ADLs.  PHYSICAL EXAMINATION: VITAL SIGNS:  Blood pressure 107/53, pulse 62, temperature 98, respirations 18. GENERAL:  Alert female follows command.  ____ for mild delay in processing.  She provides her name and age. HEENT:  EOMs intact. NECK:  Supple, nontender, no JVD. CARDIOVASCULAR:  Rate controlled. ABDOMEN:  Soft, nontender, good bowel sounds.   EXTREMITIES:  Right upper and lower extremity, 5/5; left upper and lower extremity 4+.  REHABILITATION HOSPITAL COURSE:  The patient was admitted to inpatient rehabilitation services.  Therapies initiated with evaluations ongoing.  Routine followup chemistries that were given following morning upon admission to rehab services was  unremarkable.  As therapies progressed noted on the afternoon of 03/12/2010 acute onset dense right hemiparesis,global aphasia,  increase in tone.  Suspect new left artery infarction.  Neurology service is consulted.  Vital signs stable at bedtime.  Noted dense  right side weakness with noted aphasia.  Cranial CT scan completed with followup per neurology services that showed evolving subacute infarctions without evidence of new intracranial abnormality.  CT angiogram of head and neck completed.  The patient  underwent carotid arteriogram.  Findings of occluded left MCA distal M1 with revascularization per interventional radiology.   The patient was discharged to acute care services.  All medication changes made as per neurology services.    The patient's daughter had been contacted of new changes.  The patient's condition at time of discharge is  guarded.  AN/NUANCE D:03/12/2018 T:03/13/2018 JOB:[id number]

## 2018-03-13 NOTE — Evaluation (Addendum)
Speech Language Pathology Evaluation Patient Details Name: Nancy Norris MRN: [id number] DOB: May 06, 1940 Today's Date: 03/13/2018 Time: 4098-1191 SLP Time Calculation (min) (ACUTE ONLY): 22 min  Problem List:  Patient Active Problem List   Diagnosis Date Noted  . Middle cerebral artery embolism, left 03/12/2018  . Subcortical infarction ([place]) 03/11/2018  . Pancytopenia ([place])   . NSTEMI (non-ST elevated myocardial infarction) ([place])   . Dyslipidemia   . Hypotension   . Acute DVT (deep venous thrombosis) ([place])   . Malignant neoplasm of stomach ([place])   . Acute cardioembolic stroke ([place])   . CKD (chronic kidney disease), stage III ([place])   . Dysphagia   . CKD (chronic kidney disease), stage IV ([place])   . Stroke ([place]) 03/03/2018  . Port catheter in place 09/18/2016  . Cryptococcal pneumonitis ([place]) 11/22/2015  . Symptomatic anemia 03/31/2015  . B12 deficiency 12/04/2014  . Thrombocytopenia ([place]) 12/04/2014  . Encounter for antineoplastic chemotherapy 06/21/2014  . CKD (chronic kidney disease) stage 5, GFR less than 15 ml/min (HCC) 04/02/2014  . Multiple myeloma ([place]) 12/02/2013  . Major depressive disorder, recurrent episode ([place]) 11/02/2013  . Medication management 09/30/2013  . Vitamin D deficiency 03/24/2013  . Mixed hyperlipidemia 02/24/2013  . COPD (chronic obstructive pulmonary disease) with emphysema ([place]) 02/20/2013  . GERD (gastroesophageal reflux disease) 01/20/2013  . HTN (hypertension) 01/14/2013  . Asthma, chronic 01/13/2013  . Hypothyroid 01/13/2013  . Chronic diastolic heart failure ([place]) 01/13/2013  . HX: breast cancer 12/08/2012  . Hypercalcemia 12/02/2012   Past Medical History:  Past Medical History:  Diagnosis Date  . Anemia   . Anginal pain ([place])    used NTG x 2 May 31 and 06/15/13   . Anxiety   . Arthritis   . B12 deficiency 12/04/2014  . Breast cancer ([place])   . Complication of anesthesia   . COPD (chronic obstructive pulmonary disease) ([place])   .  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 Depression   . Dizziness   . Dyspnea   . Fibromyalgia   . Fibromyalgia   . GERD (gastroesophageal reflux disease)   . Headache(784.0)   . Heart murmur   . Hemoptysis 10/21/2015  . History of blood transfusion    last one May 12   . Hx of cardiovascular stress test    LexiScan with low level exercise Myoview (02/2013): No ischemia, EF 72%; normal study  . Hx of echocardiogram    a.  Echocardiogram (12/26/2012): EF 47-82%, grade 1 diastolic dysfunction;   b.  Echocardiogram (02/2013): EF 55-60%, no WMA, trivial effusion  . Hyperkalemia   . Hyponatremia   . Hypotension   . Hypothyroidism   . Mucositis   . Multiple myeloma   . Myocardial infarction [facility])    in past, patient was unaware.   . Neuropathy   . Nodule of left lung 09/13/2015  . Pain in joint, pelvic region and thigh 07/07/2015  . Pneumonia    several  . PONV (postoperative nausea and vomiting) 2008   after mastestomy   Past Surgical History:  Past Surgical History:  Procedure Laterality Date  . ABDOMINAL HYSTERECTOMY  1981  . AV FISTULA PLACEMENT Left 06/19/2013   Procedure: CREATION OF LEFT ARM ARTERIOVENOUS (AV) FISTULA ;  Surgeon: Angelia Mould, MD;  Location: [facility];  Service: Vascular;  Laterality: Left;  . BIOPSY  03/08/2018   Procedure: BIOPSY;  Surgeon: Wilford Corner, MD;  Location: [facility];  Service: Endoscopy;;  . BREAST RECONSTRUCTION    .  BREAST SURGERY Right    reduction  . CATARACT EXTRACTION, BILATERAL    . CHOLECYSTECTOMY  1971  . COLONOSCOPY    . ESOPHAGOGASTRODUODENOSCOPY (EGD) WITH PROPOFOL N/A 03/08/2018   Procedure: ESOPHAGOGASTRODUODENOSCOPY (EGD) WITH PROPOFOL;  Surgeon: Wilford Corner, MD;  Location: [facility];  Service: Endoscopy;  Laterality: N/A;  . EYE SURGERY Bilateral    lens implant  . history of Port removal    . IR IVC FILTER PLMT / S&I /IMG GUID/MOD SED  03/09/2018  . LUNG BIOPSY  10/21/2015  . MASTECTOMY Left 2008   . PORTACATH PLACEMENT  12/2012   has had 2  . RESECTION OF ARTERIOVENOUS FISTULA ANEURYSM Left 06/04/2017   Procedure: LIGATION ANEURYSM OF LEFT ARTERIOVENOUS FISTULA;  Surgeon: Angelia Mould, MD;  Location: [facility];  Service: Vascular;  Laterality: Left;  . Status post stem cell transplant on September 28, 2008.    . TEE WITHOUT CARDIOVERSION N/A 03/07/2018   Procedure: TRANSESOPHAGEAL ECHOCARDIOGRAM (TEE);  Surgeon: Buford Dresser, MD;  Location: [facility];  Service: Cardiovascular;  Laterality: N/A;   HPI:  Nancy Norris is a 78 y.o. Caucasian female with PMH of remote breast cancer, COPD, CKD stage IV on chemo therapy, MI/CAD, multiple myeloma in remission, recently discharged from [facility] to CIR for bilateral cortical and subcortical infarcts. Around 1405 pt was found not able to speak with significant right sided weakness.  CT no acute change but subacute infarcts. Per chart CTA head and neck showed left M2 high grade stenosis vs. Near occlusive thrombus and she went for emergent mechanical thrombectomy. MBS 2/20 flash penetration with recommendation for regular/thin liquids.   Assessment / Plan / Recommendation Clinical Impression  [name] language has declined with second neuro attack yesterday. Daughter stated language is similar to period after initial stroke which had improved in rehab until yesterday. Her comprehension for basic and mildly complex information adequate during assessment although required additional time to respond and formulate thoughts. Followed all commands and manipulated objects on bedside WAB. Expressive language impairments marked by dysfluency, anomia, perseverations, decreased syntax and semantics. Description of basic ideas/objects posed very challenging. Vocal intensity is adeqaute and speech is intelligible. Required max verbal/visual assist to print her name with decreased awareness of errors. Pt sustained attention well during assessment and right  inattention noted to objects and items to the right of the page. Cognition can be further assessed on CIR. [name] prognosis is good and suspect she will progress nicely as after initial stroke. Return to CIR is recommended; ST will follow on acute.      SLP Assessment  SLP Recommendation/Assessment: Patient needs continued Speech Lanaguage Pathology Services SLP Visit Diagnosis: Cognitive communication deficit (R41.841)    Follow Up Recommendations  Inpatient Rehab    Frequency and Duration min 2x/week  2 weeks      SLP Evaluation Cognition  Overall Cognitive Status: Impaired/Different from baseline Arousal/Alertness: Awake/alert Orientation Level: Oriented to person;Oriented to place;Oriented to situation Attention: Sustained Sustained Attention: Appears intact Memory: (TBA) Awareness: Impaired Awareness Impairment: Emergent impairment;Anticipatory impairment Problem Solving: Impaired Problem Solving Impairment: Functional complex;Verbal complex Safety/Judgment: Impaired       Comprehension  Auditory Comprehension Overall Auditory Comprehension: Appears within functional limits for tasks assessed Commands: (3 step command on WAB bedside) Interfering Components: Processing speed EffectiveTechniques: Extra processing time Visual Recognition/Discrimination Discrimination: Not tested Reading Comprehension Reading Status: (TBA)    Expression Expression Primary Mode of Expression: Verbal Verbal Expression Overall Verbal Expression: Impaired Initiation: Impaired Level of Generative/Spontaneous Verbalization:  Phrase Repetition: (TBA) Naming: Impairment Confrontation: Impaired(mod assist) Pragmatics: No impairment Written Expression Dominant Hand: Right Written Expression: Exceptions to [facility] Dictation Ability: Word   Oral / Motor  Oral Motor/Sensory Function Overall Oral Motor/Sensory Function: Within functional limits Motor Speech Overall Motor Speech: Appears within  functional limits for tasks assessed Respiration: Within functional limits Phonation: Normal Resonance: Within functional limits Articulation: Within functional limitis Intelligibility: Intelligible Motor Planning: Witnin functional limits   GO                    Houston Siren 03/13/2018, 10:43 AM  Orbie Pyo Colvin Caroli.Ed Risk analyst [phone] Office [phone]

## 2018-03-13 NOTE — Progress Notes (Signed)
Inpatient Rehabilitation-Admissions Coordinator   [facility] knows this patient well from CIR. AC will follow at this time and await further evaluations to determine if pt is appropriate for return to CIR.   Jhonnie Garner, OTR/L  Rehab Admissions Coordinator  [phone] 03/13/2018 11:28 AM

## 2018-03-13 NOTE — Progress Notes (Signed)
Discussed with patient regarding her biopsies from gastric mass.  Discussed that she has poorly differentiated adenocarcinoma of stomach. Medical oncology team aware. Anticoagulation use as per oncology recommendations.  Ronnette Juniper, MD

## 2018-03-13 NOTE — IPOC Note (Signed)
No IPOC due to unexpected transfer to acute < 72 hr post admit

## 2018-03-13 NOTE — Evaluation (Signed)
Physical Therapy Evaluation Patient Details Name: Nancy Norris MRN: [id number] DOB: August 10, 1940 Today's Date: 03/13/2018   History of Present Illness  Nancy Norris is a 78 y.o. Caucasian female with PMH of remote breast cancer, COPD, CKD stage IV on chemo therapy, MI/CAD, multiple myeloma in remission, recently discharged from [facility] to CIR for bilateral cortical and subcortical infarcts. Around 1405 pt was found not able to speak with significant right sided weakness.  CT no acute change but subacute infarcts. Per chart CTA head and neck showed left M2 high grade stenosis vs. Near occlusive thrombus and she went for emergent mechanical thrombectomy.  Clinical Impression  Pt received in bed, willing to participate in therapy. Co-treat with OT. She has been in CIR until most recent stroke. Currently she is requiring MinA +2 for ambulation due to R sided deficits (see below).  She continues to be appropriate to return to CIR. She will continue to benefit from skilled acute PT services to ensure safe DC to CIR.    Follow Up Recommendations CIR;Supervision for mobility/OOB    Equipment Recommendations  Rolling walker with 5" wheels    Recommendations for Other Services       Precautions / Restrictions Precautions Precautions: Fall Precaution Comments: Rt field cut Restrictions Weight Bearing Restrictions: No      Mobility  Bed Mobility Overal bed mobility: Needs Assistance Bed Mobility: Supine to Sit     Supine to sit: HOB elevated;Min assist     General bed mobility comments: increased time, min A for line management - no physical assist required  Transfers Overall transfer level: Needs assistance Equipment used: Rolling walker (2 wheeled) Transfers: Sit to/from Stand Sit to Stand: Min assist;+2 safety/equipment;From elevated surface         General transfer comment: minA+2 for boost from bed, cuing for hand placement  Ambulation/Gait Ambulation/Gait assistance: Min  assist;+2 physical assistance;+2 safety/equipment Gait Distance (Feet): 10 Feet Assistive device: Rolling walker (2 wheeled) Gait Pattern/deviations: Decreased step length - left;Decreased step length - right;Drifts right/left Gait velocity: decreased   General Gait Details: Pt able to walk slowly towards door with minA+2 for management of RW, tactile cues at R hip for R LE progression. Fatigued quickly.  Stairs            Wheelchair Mobility    Modified Rankin (Stroke Patients Only) Modified Rankin (Stroke Patients Only) Pre-Morbid Rankin Score: Slight disability Modified Rankin: Moderately severe disability     Balance Overall balance assessment: Needs assistance Sitting-balance support: Bilateral upper extremity supported;Feet unsupported Sitting balance-Leahy Scale: Good     Standing balance support: During functional activity Standing balance-Leahy Scale: Poor Standing balance comment: dependent on RW during standing and ambulation                             Pertinent Vitals/Pain Pain Assessment: No/denies pain Pain Intervention(s): Monitored during session;Repositioned    Home Living Family/patient expects to be discharged to:: Inpatient rehab Living Arrangements: Alone Available Help at Discharge: Family;Available PRN/intermittently Type of Home: Apartment Home Access: Ramped entrance     Home Layout: One level Home Equipment: Walker - 2 wheels;Cane - single point;Shower seat;Walker - 4 wheels      Prior Function Level of Independence: Independent         Comments: PTA (initial): Rides [employer] for transportation; walks without DME. Does cooking/cleaning. ; cancer center provides rides; enjoys "diamond" painting; enjoys crafts; does her own financial and  medicaiton management     Hand Dominance   Dominant Hand: Right    Extremity/Trunk Assessment   Upper Extremity Assessment Upper Extremity Assessment: Defer to OT evaluation RUE  Deficits / Details: very small differences in weakness, decreased initiation; however able to sustain RUE at 90 degrees FF for 10 seconds RUE Sensation: WNL LUE Deficits / Details: history of L shoulder problems; seeing ortho for shoulder    Lower Extremity Assessment Lower Extremity Assessment: Defer to PT evaluation RLE Deficits / Details: 3/5 plantar flexion, otherwise grossly 4/5 difficulty progressing RLE forward during gait RLE Sensation: WNL LLE Deficits / Details: grossly 4/5 LLE Sensation: WNL    Cervical / Trunk Assessment Cervical / Trunk Assessment: Normal  Communication   Communication: Expressive difficulties  Cognition Arousal/Alertness: Awake/alert Behavior During Therapy: WFL for tasks assessed/performed Overall Cognitive Status: Impaired/Different from baseline Area of Impairment: Safety/judgement;Awareness;Problem solving;Attention                   Current Attention Level: Selective     Safety/Judgement: Decreased awareness of deficits;Decreased awareness of safety Awareness: Emergent Problem Solving: Slow processing;Requires verbal cues General Comments: Pt required cues with navigating room with RW, trouble recalling where she worked (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[employer])      General Comments      Exercises     Assessment/Plan    PT Assessment Patient needs continued PT services  PT Problem List Decreased strength;Decreased balance;Decreased knowledge of precautions;Decreased cognition;Decreased mobility;Decreased knowledge of use of DME;Decreased activity tolerance;Decreased safety awareness;Decreased coordination       PT Treatment Interventions Functional mobility training;Patient/family education;Balance training;DME instruction;Gait training;Therapeutic activities;Neuromuscular re-education;Stair training;Therapeutic exercise;Cognitive remediation    PT Goals (Current goals can be found in the Care Plan section)  Acute  Rehab PT Goals Patient Stated Goal: to return to independence PT Goal Formulation: With patient Time For Goal Achievement: 03/18/18 Potential to Achieve Goals: Good    Frequency Min 4X/week   Barriers to discharge Decreased caregiver support lives alone    Co-evaluation PT/OT/SLP Co-Evaluation/Treatment: Yes Reason for Co-Treatment: Complexity of the patient's impairments (multi-system involvement);For patient/therapist safety;To address functional/ADL transfers PT goals addressed during session: Mobility/safety with mobility;Balance;Proper use of DME;Strengthening/ROM OT goals addressed during session: ADL's and self-care;Proper use of Adaptive equipment and DME;Strengthening/ROM       AM-PAC PT "6 Clicks" Mobility  Outcome Measure Help needed turning from your back to your side while in a flat bed without using bedrails?: None Help needed moving from lying on your back to sitting on the side of a flat bed without using bedrails?: None Help needed moving to and from a bed to a chair (including a wheelchair)?: A Little Help needed standing up from a chair using your arms (e.g., wheelchair or bedside chair)?: A Little Help needed to walk in hospital room?: A Little Help needed climbing 3-5 steps with a railing? : A Lot 6 Click Score: 19    End of Session Equipment Utilized During Treatment: Gait belt Activity Tolerance: Patient limited by fatigue Patient left: in chair;with call bell/phone within reach;with chair alarm set Nurse Communication: Mobility status PT Visit Diagnosis: Unsteadiness on feet (R26.81);Difficulty in walking, not elsewhere classified (R26.2);Other symptoms and signs involving the nervous system (R29.898)    Time: 1040-1108 PT Time Calculation (min) (ACUTE ONLY): 28 min   Charges:   PT Evaluation $PT Eval Moderate Complexity: 1 Mod          Ronnell Guadalajara, SPT   Terrence Dupont  Delena Bali 03/13/2018, 2:44 PM

## 2018-03-14 ENCOUNTER — Encounter (HOSPITAL_COMMUNITY): Payer: Self-pay | Admitting: Interventional Radiology

## 2018-03-14 DIAGNOSIS — I214 Non-ST elevation (NSTEMI) myocardial infarction: Secondary | ICD-10-CM

## 2018-03-14 DIAGNOSIS — C169 Malignant neoplasm of stomach, unspecified: Secondary | ICD-10-CM

## 2018-03-14 DIAGNOSIS — D6869 Other thrombophilia: Secondary | ICD-10-CM | POA: Diagnosis present

## 2018-03-14 DIAGNOSIS — N184 Chronic kidney disease, stage 4 (severe): Secondary | ICD-10-CM

## 2018-03-14 DIAGNOSIS — F331 Major depressive disorder, recurrent, moderate: Secondary | ICD-10-CM

## 2018-03-14 DIAGNOSIS — D649 Anemia, unspecified: Secondary | ICD-10-CM

## 2018-03-14 DIAGNOSIS — Z853 Personal history of malignant neoplasm of breast: Secondary | ICD-10-CM

## 2018-03-14 DIAGNOSIS — I82452 Acute embolism and thrombosis of left peroneal vein: Secondary | ICD-10-CM

## 2018-03-14 LAB — BASIC METABOLIC PANEL
Anion gap: 10 (ref 5–15)
BUN: 18 mg/dL (ref 8–23)
CO2: 22 mmol/L (ref 22–32)
CREATININE: 2.35 mg/dL — AB (ref 0.44–1.00)
Calcium: 8.7 mg/dL — ABNORMAL LOW (ref 8.9–10.3)
Chloride: 108 mmol/L (ref 98–111)
GFR calc Af Amer: 23 mL/min — ABNORMAL LOW (ref >60)
GFR calc non Af Amer: 19 mL/min — ABNORMAL LOW (ref >60)
Glucose, Bld: 106 mg/dL — ABNORMAL HIGH (ref 70–99)
Potassium: 3.8 mmol/L (ref 3.5–5.1)
Sodium: 140 mmol/L (ref 135–145)

## 2018-03-14 LAB — CBC
HCT: 27 % — ABNORMAL LOW (ref 36.0–46.0)
Hemoglobin: 8.5 g/dL — ABNORMAL LOW (ref 12.0–15.0)
MCH: 32.2 pg (ref 26.0–34.0)
MCHC: 31.5 g/dL (ref 30.0–36.0)
MCV: 102.3 fL — ABNORMAL HIGH (ref 80.0–100.0)
PLATELETS: 78 10*3/uL — AB (ref 150–400)
RBC: 2.64 MIL/uL — ABNORMAL LOW (ref 3.87–5.11)
RDW: 21.2 % — AB (ref 11.5–15.5)
WBC: 6.9 10*3/uL (ref 4.0–10.5)
nRBC: 0 % (ref 0.0–0.2)

## 2018-03-14 LAB — BPAM RBC
Blood Product Expiration Date: 202003262359
ISSUE DATE / TIME: 202002272046
Unit Type and Rh: 5100

## 2018-03-14 LAB — TYPE AND SCREEN
ABO/RH(D): O POS
Antibody Screen: NEGATIVE
Unit division: 0

## 2018-03-14 LAB — IRON AND TIBC
Iron: 192 ug/dL — ABNORMAL HIGH (ref 28–170)
Saturation Ratios: 88 % — ABNORMAL HIGH (ref 10.4–31.8)
TIBC: 218 ug/dL — ABNORMAL LOW (ref 250–450)
UIBC: 26 ug/dL

## 2018-03-14 LAB — GLUCOSE, CAPILLARY
Glucose-Capillary: 109 mg/dL — ABNORMAL HIGH (ref 70–99)
Glucose-Capillary: 93 mg/dL (ref 70–99)
Glucose-Capillary: 100 mg/dL — ABNORMAL HIGH (ref 70–99)

## 2018-03-14 LAB — TROPONIN I: Troponin I: 0.21 ng/mL (ref <0.03)

## 2018-03-14 LAB — FERRITIN: Ferritin: 924 ng/mL — ABNORMAL HIGH (ref 11–307)

## 2018-03-14 MED ORDER — SENNOSIDES-DOCUSATE SODIUM 8.6-50 MG PO TABS
1.0000 | ORAL_TABLET | Freq: Every day | ORAL | Status: DC
  Administered 2018-03-14: 1 via ORAL
  Filled 2018-03-14: qty 1

## 2018-03-14 MED ORDER — BISACODYL 10 MG RE SUPP
10.0000 mg | Freq: Once | RECTAL | Status: AC
  Administered 2018-03-14: 10 mg via RECTAL
  Filled 2018-03-14: qty 1

## 2018-03-14 MED ORDER — TRAMADOL HCL 50 MG PO TABS
50.0000 mg | ORAL_TABLET | Freq: Four times a day (QID) | ORAL | Status: DC | PRN
  Administered 2018-03-14: 50 mg via ORAL
  Filled 2018-03-14: qty 1

## 2018-03-14 MED ORDER — APIXABAN 5 MG PO TABS: 5.0000 mg | ORAL_TABLET | Freq: Two times a day (BID) | ORAL | Status: DC

## 2018-03-14 MED ORDER — APIXABAN 5 MG PO TABS
5.0000 mg | ORAL_TABLET | Freq: Two times a day (BID) | ORAL | Status: DC
  Administered 2018-03-15: 5 mg via ORAL
  Filled 2018-03-14 (×3): qty 1

## 2018-03-14 NOTE — PMR Pre-admission (Signed)
PMR Admission Coordinator Pre-Admission Assessment  Patient: Nancy Norris is an 78 y.o., female MRN: [id number] DOB: 01-29-1940 Height:   Weight:    Insurance Information HMO:     PPO:      PCP:      IPA:      80/20: Yes     OTHER:  PRIMARY: Medicare A and B      Policy#: [id number]      Subscriber: Patient CM Name:       Phone#:      Fax#:  Pre-Cert#:       Employer:  Benefits:  Phone #: NA     Name: Verified eligibility via [employer] on 03/14/18 Eff. Date:  Part A effective: 09/16/06; Part B: 09/16/06     Deduct: $1,408.00      Out of Pocket Max: NA      Life Max: NA CIR: covered per Medicare guidelines once yearly deductible is met      SNF: days 1-20, 100%, days 21-100, 80% Outpatient: 80%     Co-Pay: 20% Home Health: 100%      Co-Pay:  DME: 80%     Co-Pay: 20% Providers: [name] choice SECONDARY: BCBS      Policy#: [id number]      Subscriber: Patient CM Name:       Phone#:      Fax#:  Pre-Cert#:       Employer:  Benefits:  Phone #: [phone]     Name:  Eff. Date:      Deduct:       Out of Pocket Max:       Life Max:  CIR:       SNF:  Outpatient:      Co-Pay:  Home Health:       Co-Pay:  DME:      Co-Pay:  Medicaid Application Date:       Case Manager:  Disability Application Date:       Case Worker:   Emergency Contact Information Contact Information    Name Relation Home Work [place] Daughter [phone]      Current Medical History  Patient Admitting Diagnosis: multifocal L MCA/L BG infarct. Small R parietal lobe infarct  History of Present Illness: Nancy Norris is a 78 year old female with history of multiple myeloma in remission followed by Dr. Earlie Server, remote breast cancer as well as COPD with remote tobacco abuse 13 years ago, medical history also complicated by chronic anemia with thrombocytopenia, fibromyalgia as well as CKD stage IV with creatinine 2.29-2.93 and hypotension maintained on ProAmatine. Patient  initially presented 03/03/2018 with altered mental status difficulty swallowing as well as headache 2 weeks. Lactic acid 1.2, mildly elevated ammonia level. Previously she was seen in the ED on 02/26/2018 for episodes of dizziness as well as headache and fall. CT of the head was negative. She had a hemoglobin of 7.9 with noted history of chronic anemia 8 urine culture showed greater than 100,000 Escherichia coli she was discharged to home after being placed on Keflex. On this latest admission cranial CT scan repeated showing 2 separate areas of abnormal cortical and subcortical edema in the left hemisphere affecting the left parietal lobe occipital lobes that were not present on 02/26/2018. MRI the brain showed left greater than right small to moderate size multifocal infarctions. Per report, acute ischemia within multi vascular territories in both hemispheres. MRA was unremarkable. Patient did not receive TPA. Her echocardiogram showed ejection fraction of  38% normal systolic function. Carotid Dopplers without ICA stenosis. Blood cultures positive for staph species and Proteus she was placed on antibiotic therapy with vancomycin and ceftriaxone 03/06/2018. A swallow study completed as well as esophagram 03/06/2018 showed nonspecific esophageal dysmotility no appreciable upper gastric diverticulum and an initially on a regular diet. TEE completed 03/07/2018 with ejection fraction of 65% and no thrombus or vegetation. Small PFO seen by color Doppler. She did remain on aspirin for CVA prophylaxis. Gastroenterology services consulted for evaluation of noted abnormal barium swallow question GI bleed with noted history of anemia. An upper GI endoscopy completed 03/08/2020 per Dr. Michail Sermon showed a large fungating malignant appearing ulcerated proximal stomach mass that had areas of blood seen consistent with recent bleeding and and biopsies obtained. Mass likely causing partial obstruction which was contributing to her  dysphagia. Recommendations were for CT of abdomen and pelvis completed showing marked wall thickening in the esophagogastric junction and proximal stomach consistent with known neoplasm. Bulky lymphadenopathy in the gastrohepatic ligament compatible with metastatic disease. There was a 2.7 cm hypoattenuating lesion in the spleen as well as scattered tiny pulmonary nodules measuring up to 5 mm and mild lymphadenopathy noted in the upper mediastinum 1.9 x 1.2 cm irregular lesion posterior left upper lobe at the site of a 4 cm mass that was also noted on PET scan 10/11/2015 features compatible with residual of that lesion. Oncology services Dr. Earlie Server follow-up in regards to findings pathology report remain pending current plan was to await biopsy and make recommendations about management of suspicious malignancy. Hospital course complicated by findings of acute DVT left posterior tibial vein and left peroneal vein radiology services consulted 03/09/2018 underwent IVC filter placement by Dr. Barbie Banner of interventional radiology. On 03/10/2018 cardiology again follow-up for nonspecific chest discomfort dizziness as well as elevated troponin 2.15-2.3. EKG did show new Q waves on her ECG in the inferior leads. Repeat echocardiogram showing ejection fraction 65%. The right ventricle normal systolic function no increase in right ventricular wall thickness. Therapy evaluations completed and patient was admitted to inpatient rehabilitation services 03/11/2018. On the afternoon of 03/12/2018 patient became more confused and garbled speech/expressive aphasia and bilateral upper extremity increased weakness as well as increased tone. Neurology service was consulted stat cranial CT scan obtained that showed evolving subacute infarctions without evidence of new intracranial abnormality. Patient was discharged to acute care services 03/12/2018 for further evaluation. CT angiogram of head and neck showed high-grade stenosis versus  nonocclusive thrombus involving the proximal left superior M2 division. Interventional radiology was consulted and pt underwent complete revascularization of occluded left MCA distal M1 occlusion.Ffter discussing with hematology oncology services in regards to left MCA infarction likely related to hypercoagulable state plan was to begin [facility] in place of aspirin therapy and watch for any bleeding tendencies.  Her diet remains a regular consistency. Pathology report of gastric mass returned poorly differentiated adenocarcinoma of the stomach and follow-up oncology services and plan to follow-up as outpatient. Of note, on 03/15/18, pt was complaining on chest pain. Pt received sublingual NTG. A stat EKG revealed normal sinus rhythm, low voltage QRS, and nonspecific T wave abnormality. Troponin levels taken with value same as day prior at 0.21.  Therapy evaluations again have been completed after latest event and recommendations were to return to inpatient rehabilitation services for comprehensive therapies. Pt is to be admitted to CIR on 03/15/2018.    Patient's medical record from [facility] has been reviewed by the rehabilitation admission coordinator and  physician.   Current NIH: 3  Past Medical History  Past Medical History:  Diagnosis Date  . Anemia   . Anginal pain ([place])    used NTG x 2 May 31 and 06/15/13   . Anxiety   . Arthritis   . B12 deficiency 12/04/2014  . Breast cancer ([place])   . Complication of anesthesia   . COPD (chronic obstructive pulmonary disease) ([place])   . Cryptococcal pneumonitis ([place]) 11/22/2015  . Depression   . Dizziness   . Dyspnea   . Fibromyalgia   . Fibromyalgia   . GERD (gastroesophageal reflux disease)   . Headache(784.0)   . Heart murmur   . Hemoptysis 10/21/2015  . History of blood transfusion    last one May 12   . Hx of cardiovascular stress test    LexiScan with low level exercise Myoview (02/2013): No ischemia, EF 72%; normal study  .  Hx of echocardiogram    a.  Echocardiogram (12/26/2012): EF 73-40%, grade 1 diastolic dysfunction;   b.  Echocardiogram (02/2013): EF 55-60%, no WMA, trivial effusion  . Hyperkalemia   . Hyponatremia   . Hypotension   . Hypothyroidism   . Mucositis   . Multiple myeloma   . Myocardial infarction [facility])    in past, patient was unaware.   . Neuropathy   . Nodule of left lung 09/13/2015  . Pain in joint, pelvic region and thigh 07/07/2015  . Pneumonia    several  . PONV (postoperative nausea and vomiting) 2008   after mastestomy    Family History   family history includes Arthritis in her mother; Asthma in her mother; Cancer in her sister; Hyperlipidemia in her brother.  Prior Rehab/Hospitalizations Has the patient had major surgery during 100 days prior to admission? Yes    Current Medications  Current Facility-Administered Medications:  .   stroke: mapping our early stages of recovery book, , Does not apply, Once, Rosalin Hawking, MD .  0.9 %  sodium chloride infusion (Manually program via Guardrails IV Fluids), , Intravenous, Once, Rosalin Hawking, MD .  0.9 %  sodium chloride infusion, , Intravenous, Continuous, Rosalin Hawking, MD, Last Rate: 40 mL/hr at 03/14/18 1600 .  acetaminophen (TYLENOL) tablet 650 mg, 650 mg, Oral, Q4H PRN, 650 mg at 03/14/18 0952 **OR** [DISCONTINUED] acetaminophen (TYLENOL) solution 650 mg, 650 mg, Per Tube, Q4H PRN **OR** [DISCONTINUED] acetaminophen (TYLENOL) suppository 650 mg, 650 mg, Rectal, Q4H PRN, Deveshwar, Sanjeev, MD .  albuterol (PROVENTIL) (2.5 MG/3ML) 0.083% nebulizer solution 3 mL, 3 mL, Inhalation, Q6H PRN, Rosalin Hawking, MD .  apixaban (ELIQUIS) tablet 5 mg, 5 mg, Oral, BID, Rosalin Hawking, MD .  atorvastatin (LIPITOR) tablet 20 mg, 20 mg, Oral, q1800, Rosalin Hawking, MD, 20 mg at 03/13/18 1737 .  bisacodyl (DULCOLAX) suppository 10 mg, 10 mg, Rectal, Once, Rosalin Hawking, MD .  buPROPion [facility]) tablet 75 mg, 75 mg, Oral, BID, Rosalin Hawking, MD, 75 mg at  03/14/18 0902 .  Chlorhexidine Gluconate Cloth 2 % PADS 6 each, 6 each, Topical, Daily, Rosalin Hawking, MD .  citalopram (CELEXA) tablet 40 mg, 40 mg, Oral, Daily, Rosalin Hawking, MD, 40 mg at 03/14/18 0901 .  cromolyn (OPTICROM) 4 % ophthalmic solution 1-2 drop, 1-2 drop, Both Eyes, QID PRN, Rosalin Hawking, MD .  folic acid (FOLVITE) tablet 1 mg, 1 mg, Oral, Daily, Rosalin Hawking, MD, 1 mg at 03/14/18 0902 .  ipratropium-albuterol (DUONEB) 0.5-2.5 (3) MG/3ML nebulizer solution 3 mL, 3 mL, Nebulization, Q4H PRN, Erlinda Hong,  Jindong, MD .  levothyroxine (SYNTHROID, LEVOTHROID) tablet 150 mcg, 150 mcg, Oral, QAC breakfast, Rosalin Hawking, MD, 150 mcg at 03/14/18 [phone] .  midodrine (PROAMATINE) tablet 10 mg, 10 mg, Oral, Daily, Rosalin Hawking, MD, 10 mg at 03/14/18 0901 .  pantoprazole (PROTONIX) EC tablet 40 mg, 40 mg, Oral, Daily, Rosalin Hawking, MD, 40 mg at 03/14/18 0901 .  senna-docusate (Senokot-S) tablet 1 tablet, 1 tablet, Oral, QHS, Xu, Jindong, MD .  sodium chloride flush (NS) 0.9 % injection 10-40 mL, 10-40 mL, Intracatheter, Q12H, Rosalin Hawking, MD, 10 mL at 03/14/18 1103 .  sodium chloride flush (NS) 0.9 % injection 10-40 mL, 10-40 mL, Intracatheter, PRN, Rosalin Hawking, MD, 10 mL at 03/14/18 1103 .  traMADol (ULTRAM) tablet 50 mg, 50 mg, Oral, Q6H PRN, Biby, Sharon L, NP, 50 mg at 03/14/18 1036 .  vitamin B-12 (CYANOCOBALAMIN) tablet 1,000 mcg, 1,000 mcg, Oral, Daily, Rosalin Hawking, MD, 1,000 mcg at 03/14/18 0902  Patients Current Diet:   Diet Order            Diet regular Room service appropriate? Yes; Fluid consistency: Thin  Diet effective now              Precautions / Restrictions Precautions Precautions: Fall Precaution Comments: Rt field cut Restrictions Weight Bearing Restrictions: No   Has the patient had 2 or more falls or a fall with injury in the past year?Yes  Prior Activity Level Limited Community (1-2x/wk): did not drive or work PTA; stayed in mostly during cold weather; ordered groceries to  her home, had home delivery medications.  Prior Functional Level Do you want Prior Function Level of Independence: Independent Comments: PTA (initial): Rides [employer] for transportation; walks without DME. Does cooking/cleaning. ; cancer center provides rides; enjoys "diamond" painting; enjoys crafts; does her own financial and Best boy from other? Self Care: Did the patient need help bathing, dressing, using the toilet or eating?  Independent  Indoor Mobility: Did the patient need assistance with walking from room to room (with or without device)? Independent  Stairs: Did the patient need assistance with internal or external stairs (with or without device)? Independent  Functional Cognition: Did the patient need help planning regular tasks such as shopping or remembering to take medications? [place] / Equipment Home Equipment: Environmental consultant - 2 wheels, [place] - single point, Civil engineer, contracting, Environmental consultant - 4 wheels  Prior Device Use: Indicate devices/aids used by the patient prior to current illness, exacerbation or injury? None of the above  Prior Functional Level Vocation: Retired Comments: PTA (initial): Rides [employer] for transportation; walks without DME. Does cooking/cleaning. ; cancer center provides rides; enjoys "Engineer, building services; enjoys crafts; does her own Patent attorney   Prior Functional Level Current Functional Level  Bed Mobility Independent Min G  Transfers Independent  Min A x2  Mobility - Walk/Wheelchair Independent Min A x2  Mobility - Ambulation/Gait Independent Min A x2  Upper Body Dressing Independent Mod A  Lower Body Dressing Independent Max A  Grooming Independent Set up, sitting  Eating/Drinking Independent Min A  Economist Min A x2  Bladder Continence Continent, Independent Mod A, continent  Bowel Management  Continent, Independent Mod A, continent   Stair Administrator NT  Communication  Independent Impaired; verbal expression impaired  Memory Independent NT formally; per SLP, TBA.   Cooking/Meal Prep  Independent      Housework  Independent   Money Management  Independent   Driving  Dependent  Special needs/care consideration BiPAP/CPAP: no CPM: no Continuous Drip IV: 0.9% sodium chloride infusion  Dialysis: no        Days: no Life Vest: no Oxygen: no, RA Special Bed: no Trach Size: no Wound Vac (area): no      Location: no Skin: ecchymosis chest, right upper; skin tear buttocks; surgical incision                Bowel mgmt: continent, last BM: 03/11/18 Bladder mgmt: continent Diabetic mgmt: no  Previous Home Environment Living Arrangements: Alone  Lives With: Alone Available Help at Discharge: Family, Available PRN/intermittently Type of Home: Apartment Home Layout: One level Home Access: Ramped entrance Bathroom Shower/Tub: Chiropodist: Standard Bathroom Accessibility: Yes How Accessible: Accessible via walker  Discharge Living Setting Plans for Discharge Living Setting: Patient's home, Alone, Apartment Type of Home at Discharge: Apartment Discharge Home Layout: One level Discharge Home Access: Ramped entrance Discharge Bathroom Shower/Tub: Tub/shower unit Discharge Bathroom Toilet: Standard Discharge Bathroom Accessibility: Yes How Accessible: Accessible via walker(if sidesteps) Does the patient have any problems obtaining your medications?: No  Social/Family/Support Systems Patient Roles: Other (Comment)(has neigbor who checks in) Daugther plans to take FMLA Contact Information: Nancy Norris (daugther): [phone]; work: [phone]; Sue Lush (Family friend): [phone] Anticipated Caregiver: Sue Lush can check in daily after work (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 Per daughter, she will take FMLA (approx 2 weeks once pt DC home).  Anticipated Caregiver's Contact Information: see  above Ability/Limitations of Caregiver: supervision/Min A for light IADLs Caregiver Availability: 24/7 A for approx 2 weeks, then intermittent assist anticipated.  Discharge Plan Discussed with Primary Caregiver: Yes(pt and daugther) Is Caregiver In Agreement with Plan?: Yes Does Caregiver/Family have Issues with Lodging/Transportation while Pt is in Rehab?: No  Goals/Additional Needs Patient/Family Goal for Rehab: PT/OT: Supervision; SLP: Supervision Expected length of stay: 7-10 dyas Cultural Considerations: [facility] Dietary Needs: regular, thin liquids Equipment Needs: TBD Pt/Family Agrees to Admission and willing to participate: Yes Program Orientation Provided & Reviewed with Pt/Caregiver Including Roles  & Responsibilities: Yes(with pt and her daugther)  Barriers to Discharge: Decreased caregiver support, Medical stability, Lack of/limited family support, Pending chemo/radiation  Barriers to Discharge Comments: Per family, pt should have 24/7 A for approx 2 weeks; pt will need to find assist if needed for longer duration.   Patient Condition: I have reviewed medical records from [facility], spoken with pt, RN, MD, and PT. I met with patient at the bedside and discussed CIR program and gathered all information required for inpatient rehabilitation assessment. Patient will benefit from ongoing PT, OT, and SLP, can actively participate in 3 hours of therapy a day 5 days of the week, and can make measurable gains during the admission.  Patient will also benefit from the coordinated team approach during an Inpatient Acute Rehabilitation admission.  The patient will receive intensive therapy as well as Rehabilitation physician, nursing, social worker, and care management interventions.  Due to safety, skin/wound care, disease management, medical administration, pain management, patient education the patient requires 24 hour a day rehabilitation nursing.  The patient is currently  Min A x2 with mobility and Mod/Max A basic ADLs.  Discharge setting and therapy post discharge at home with home health and 24/7 A from daughter is anticipated.  Patient has agreed to participate in the Acute Inpatient Rehabilitation Program and will admit 03/15/18.  Preadmission Screen Completed By:  Jhonnie Garner, 03/14/2018 4:51 PM ______________________________________________________________________   Discussed  status with Dr. Letta Pate on 03/14/18 at 4:00PM and received telephone approval for admission today.  Admission Coordinator:  Jhonnie Garner, time 12:14  /Date 03/15/2018  Assessment/Plan: Diagnosis: Left middle cerebral artery infarct with RIght hemiparesis and aphasia 1. Does the need for close, 24 hr/day  Medical supervision in concert with the patient's rehab needs make it unreasonable for this patient to be served in a less intensive setting? Yes 2. Co-Morbidities requiring supervision/potential complications: Full myeloma, remote breast carcinoma, gastric adenocarcinoma, CKD 4, non-ST elevation MI 3. Due to bladder management, bowel management, safety, skin/wound care, disease management, medication administration, pain management and patient education, does the patient require 24 hr/day rehab nursing? Yes 4. Does the patient require coordinated care of a physician, rehab nurse, PT (1-2 hrs/day, 5 days/week), OT (1-2 hrs/day, 5 days/week) and SLP (.5-1 hrs/day, 5 days/week) to address physical and functional deficits in the context of the above medical diagnosis(es)? Yes Addressing deficits in the following areas: balance, endurance, locomotion, strength, transferring, bowel/bladder control, bathing, dressing, feeding, grooming, toileting, cognition, speech, language, swallowing and psychosocial support 5. Can the patient actively participate in an intensive therapy program of at least 3 hrs of therapy 5 days a week? Yes 6. The potential for patient to make measurable gains while on  inpatient rehab is excellent 7. Anticipated functional outcomes upon discharge from inpatients are: supervision PT, supervision OT, supervision SLP 8. Estimated rehab length of stay to reach the above functional goals is: 7-10d 9. Anticipated D/C setting: Home 10. Anticipated post D/C treatments: [facility] therapy 11. Overall Rehab/Functional Prognosis: excellent    RECOMMENDATIONS: This patient's condition is appropriate for continued rehabilitative care in the following setting: CIR Patient has agreed to participate in recommended program. Yes Note that insurance prior authorization may be required for reimbursement for recommended care.  Comment:  Daughter at bedside , aware of plan    Charlett Blake M.D. [facility] FAAPM&R (Sports Med, Neuromuscular Med) Diplomate Am Board of Electrodiagnostic Med  Jhonnie Garner 03/14/2018

## 2018-03-14 NOTE — Progress Notes (Signed)
Referring Physician(s): CODE STROKE- Rosalin Hawking  Supervising Physician: Luanne Bras  Patient Status:  [facility] - In-pt  Chief Complaint: "Memory"  Subjective:  Left MCA distal M1 segment occlusion s/p emergent mechanical thrombectomy achieving a TICI 3 revascularization 03/12/2018 by Dr. Estanislado Pandy. Patient awake and alert laying in bed. Accompanied by daughter at bedside. Still with expressive aphasia but it has improved since yesterday, daughter complains of "memory issues" with word recall. Can spontaneously move all extremities. Right groin incision c/d/i.   Allergies: Codeine; Latex; Chocolate; Onion; Zyprexa [olanzapine]; Adhesive [tape]; Heparin; Hydrocodone; Iodinated diagnostic agents; Oxycodone; Promethazine-dm; and Sulfa antibiotics  Medications: Prior to Admission medications   Medication Sig Start Date End Date Taking? Authorizing Provider  acetaminophen (TYLENOL) 500 MG tablet Take 2 tablets (1,000 mg total) by mouth daily as needed for moderate pain or headache. 03/11/18   Florencia Reasons, MD  albuterol (PROVENTIL HFA;VENTOLIN HFA) 108 (90 Base) MCG/ACT inhaler Inhale 1-2 puffs into the lungs every 6 (six) hours as needed for wheezing or shortness of breath (cough). 01/10/18   Garnet Sierras, NP  aspirin EC 325 MG tablet Take 1 tablet (325 mg total) by mouth daily. 03/11/18   Florencia Reasons, MD  atorvastatin (LIPITOR) 20 MG tablet Take 1 tablet (20 mg total) by mouth daily at 6 PM. 03/11/18   Florencia Reasons, MD  azelastine (ASTELIN) 0.1 % nasal spray Place 1 spray into both nostrils daily. Use in each nostril as directed 01/10/18   Garnet Sierras, NP  buPROPion (WELLBUTRIN) 75 MG tablet Take 1 tablet (75 mg total) by mouth 2 (two) times daily. 05/29/17 05/29/18  Liane Comber, NP  citalopram (CELEXA) 40 MG tablet Take 1 tablet daily for Mood 10/18/17   [name], MD  cromolyn (OPTICROM) 4 % ophthalmic solution INSTILL 1 TO 2 DROPS IN AFFECTED EYE(S) 4 TIMES A DAY FOR  ALLERGIES. Patient taking differently: Place 1-2 drops into both eyes 4 (four) times daily as needed (allergies).  12/27/17   Liane Comber, NP  feeding supplement, ENSURE ENLIVE, (ENSURE ENLIVE) LIQD Take 237 mLs by mouth 2 (two) times daily between meals. 03/11/18   Florencia Reasons, MD  fluconazole (DIFLUCAN) 150 MG tablet Take one tablet at onset of symptoms and second tablet three days later for yeast. Patient not taking: Reported on 03/03/2018 01/10/18   Garnet Sierras, NP  folic acid (FOLVITE) 1 MG tablet Take 1 tablet (1 mg total) by mouth daily. 03/19/17 03/19/18  Kinnie Feil, MD  gabapentin (NEURONTIN) 300 MG capsule TAKE 1 CAPSULE BY MOUTH 3 TIMES DAILY. Patient taking differently: Take 300-600 mg by mouth See admin instructions. 600 mg at bedtime and an additional 300 mg during the day if "cramping" 02/17/18   [name], MD  ipratropium-albuterol (DUONEB) 0.5-2.5 (3) MG/3ML SOLN Take 3 mLs by nebulization every 4 (four) hours as needed. Max:6 doses per day 01/10/18   Garnet Sierras, NP  levocetirizine (XYZAL) 5 MG tablet Take 1 tablet (5 mg total) by mouth every evening. 01/10/18 01/11/19  Garnet Sierras, NP  levothyroxine (SYNTHROID, LEVOTHROID) 150 MCG tablet TAKE 1 TABLET BY MOUTH DAILY. NEED OFFICE VISIT FOR FURTHER REFILLS. Patient taking differently: Take by mouth daily before breakfast. TAKE 1 TABLET BY MOUTH DAILY. NEED OFFICE VISIT FOR FURTHER REFILLS. 10/03/17   [name], MD  loperamide (IMODIUM) 2 MG capsule Take 1 capsule (2 mg total) by mouth as needed for diarrhea or loose stools. 02/28/16   [name], MD  magic mouthwash SOLN Take 5 mLs  by mouth 4 (four) times daily as needed for mouth pain. 10/27/15   Curt Bears, MD  midodrine (PROAMATINE) 10 MG tablet Take 1 tablet daily to support BP Patient taking differently: Take 10 mg by mouth daily.  12/03/17   [name], MD  montelukast (SINGULAIR) 10 MG tablet Take 1 tablet daily for Allergies  10/18/17   [name], MD  nitroGLYCERIN (NITROSTAT) 0.4 MG SL tablet Place 0.4 mg under the tongue every 5 (five) minutes as needed for chest pain.     [provider]  nystatin (MYCOSTATIN/NYSTOP) powder Apply topically 2 (two) times daily. 01/10/18   McClanahan, Danton Sewer, NP  ondansetron (ZOFRAN ODT) 8 MG disintegrating tablet Take 1 tablet (8 mg total) by mouth every 8 (eight) hours as needed for nausea or vomiting. 12/17/17   Curt Bears, MD  pantoprazole (PROTONIX) 40 MG tablet Take 1 tablet (40 mg total) by mouth daily. 03/11/18   Florencia Reasons, MD  pilocarpine (SALAGEN) 5 MG tablet TAKE 1 TABLET BY MOUTH 3 TIMES DAILY FOR DRY MOUTH. Patient taking differently: Take 5 mg by mouth 2 (two) times daily.  09/26/16   [name], MD  ranitidine (ZANTAC) 300 MG tablet Take 1 tablet 2 x /day at [facility] & Bedtime for Heart burn & Reflux Patient taking differently: Take 300 mg by mouth 2 (two) times daily.  09/16/17   [name], MD  sucralfate (CARAFATE) 1 g tablet Take 1 tablet dissolved in water  4 x /day BEFORE Meals & Bedtime for Acid indigestion & Reflux Patient taking differently: Take 1 g by mouth 4 (four) times daily -  with meals and at bedtime. Take 1 tablet dissolved in water  4 x /day BEFORE Meals & Bedtime for Acid indigestion & Reflux 10/04/17   [name], MD  traMADol (ULTRAM) 50 MG tablet Take 1 to 2 tablets by mouth 4 times a day as needed for pain. Patient taking differently: Take 50-100 mg by mouth 4 (four) times daily as needed for moderate pain. Take 1 to 2 tablets by mouth 4 times a day as needed for pain. 07/22/15   [name], MD  umeclidinium-vilanterol (ANORO ELLIPTA) 62.5-25 MCG/INH AEPB Inhale 1 puff into the lungs daily as needed (shortness of breath). Patient not taking: Reported on 03/03/2018 01/10/18   Garnet Sierras, NP  vitamin B-12 (CYANOCOBALAMIN) 1000 MCG tablet Take 1 tablet (1,000 mcg total) by mouth daily. Patient taking differently: Take  2,000 mcg by mouth daily.  03/19/17   Kinnie Feil, MD     Vital Signs: BP 132/71   Pulse 65   Temp 98 F (36.7 C) (Oral)   Resp 15   SpO2 100%   Physical Exam Vitals signs and nursing note reviewed.  Constitutional:      General: She is not in acute distress.    Appearance: Normal appearance.  Pulmonary:     Effort: Pulmonary effort is normal. No respiratory distress.  Skin:    General: Skin is warm and dry.     Comments: Right groin incision soft without active bleeding or hematoma.  Neurological:     Mental Status: She is alert.     Comments: Alert and awake, she is oriented to person and place but not time (she knows it is Friday but cannot recall the month). Follows simple commands appropriately. Speech is clear but demonstrates difficulty with word finding. PERRL bilaterally. EOMs intact bilaterally without nystagmus or subjective diplopia. No facial asymmetry. Tongue midline. Can spontaneously move all  extremities. No pronator drift. Fine motor and coordination intact and symmetric. Visual fields, gait, romberg, and heel to toe not assessed. Distal pulses 2+ bilaterally.  Psychiatric:        Mood and Affect: Mood normal.        Behavior: Behavior normal.        Thought Content: Thought content normal.        Judgment: Judgment normal.     Imaging: Ct Angio Head W Or Wo Contrast  Result Date: 03/12/2018 CLINICAL DATA:  Stroke, follow-up. Recent acute nonhemorrhagic infarcts involving the medial left occipital lobe and bilateral parietal lobes. Punctate acute infarct in the posterior left frontal lobe. EXAM: CT ANGIOGRAPHY HEAD AND NECK TECHNIQUE: Multidetector CT imaging of the head and neck was performed using the standard protocol during bolus administration of intravenous contrast. Multiplanar CT image reconstructions and MIPs were obtained to evaluate the vascular anatomy. Carotid stenosis measurements (when applicable) are obtained utilizing NASCET  criteria, using the distal internal carotid diameter as the denominator. CONTRAST:  11mL ISOVUE-370 IOPAMIDOL (ISOVUE-370) INJECTION 76% COMPARISON:  MRI of the brain and MRA [place] 03/03/2018. CT head without contrast 03/12/2018. FINDINGS: CTA NECK FINDINGS Aortic arch: A 3 vessel arch configuration is present. Atherosclerotic changes are noted at the origin of the left subclavian artery without significant stenosis. Additional calcifications are present in the distal arch. There is no aneurysm. Right carotid system: The right common carotid artery is tortuous proximally. Minimal atherosclerotic changes are noted at the right carotid bifurcation. There is no significant stenosis. There is mild tortuosity of the cervical right ICA without significant stenosis. Left carotid system: The left common carotid artery is mildly tortuous without significant stenosis. The bifurcation is unremarkable. There is moderate tortuosity of the more distal cervical left ICA without a significant stenosis. Vertebral arteries: The left vertebral artery is slightly dominant to the right. Both vertebral arteries originate from the subclavian arteries without significant stenosis. There is no significant stenosis or vascular injury to either vertebral artery in the neck. Skeleton: Levoconvex curvature is present in the lower cervical spine. Grade 1 degenerative anterolisthesis is present at C4-5. Uncovertebral disease is present bilaterally at C5-6 and C6-7. Other neck: The soft tissues of the neck demonstrate moderate atrophy of the thyroid. Salivary glands are within normal limits bilaterally. Mediastinal and thoracic inlet adenopathy again noted, recent described on CT of the chest. Upper chest: A ring-like lesion in the right upper lobe measures 18 mm, stable. No other focal nodules are visualized. Centrilobular emphysematous changes are present. Review of the MIP images confirms the above findings CTA HEAD FINDINGS Anterior  circulation: Atherosclerotic calcifications are present within the cavernous internal carotid arteries bilaterally. There is no significant stenosis relative to the ICA termini. The A1 and M1 segments are normal. Anterior communicating artery is patent. MCA bifurcations are intact bilaterally. There is a high-grade stenosis or nonocclusive thrombus involving the superior left M2 division proximally. Posterior circulation: Left vertebral artery is dominant. PICA origins are visualized and normal. Both posterior cerebral arteries originate from basilar tip. A left posterior communicating artery contributes. PCA branch vessels are within normal limits bilaterally. Venous sinuses: Dural sinuses are patent. Straight sinus deep cerebral veins are intact. Cortical veins are within normal limits. Anatomic variants: None Review of the MIP images confirms the above findings IMPRESSION: 1. High-grade stenosis versus nonocclusive thrombus involving the proximal left superior M2 division. 2. No other focal stenosis or occlusion. 3. No aneurysm. 4. Tortuosity of the cervical vasculature without significant  stenosis. 5. Aortic Atherosclerosis (ICD10-I70.0). No aneurysm or stenosis at the aortic arch. Electronically Signed   By: San Morelle M.D.   On: 03/12/2018 15:37   Ct Angio Neck W Or Wo Contrast  Result Date: 03/12/2018 CLINICAL DATA:  Stroke, follow-up. Recent acute nonhemorrhagic infarcts involving the medial left occipital lobe and bilateral parietal lobes. Punctate acute infarct in the posterior left frontal lobe. EXAM: CT ANGIOGRAPHY HEAD AND NECK TECHNIQUE: Multidetector CT imaging of the head and neck was performed using the standard protocol during bolus administration of intravenous contrast. Multiplanar CT image reconstructions and MIPs were obtained to evaluate the vascular anatomy. Carotid stenosis measurements (when applicable) are obtained utilizing NASCET criteria, using the distal internal carotid  diameter as the denominator. CONTRAST:  74mL ISOVUE-370 IOPAMIDOL (ISOVUE-370) INJECTION 76% COMPARISON:  MRI of the brain and MRA [place] 03/03/2018. CT head without contrast 03/12/2018. FINDINGS: CTA NECK FINDINGS Aortic arch: A 3 vessel arch configuration is present. Atherosclerotic changes are noted at the origin of the left subclavian artery without significant stenosis. Additional calcifications are present in the distal arch. There is no aneurysm. Right carotid system: The right common carotid artery is tortuous proximally. Minimal atherosclerotic changes are noted at the right carotid bifurcation. There is no significant stenosis. There is mild tortuosity of the cervical right ICA without significant stenosis. Left carotid system: The left common carotid artery is mildly tortuous without significant stenosis. The bifurcation is unremarkable. There is moderate tortuosity of the more distal cervical left ICA without a significant stenosis. Vertebral arteries: The left vertebral artery is slightly dominant to the right. Both vertebral arteries originate from the subclavian arteries without significant stenosis. There is no significant stenosis or vascular injury to either vertebral artery in the neck. Skeleton: Levoconvex curvature is present in the lower cervical spine. Grade 1 degenerative anterolisthesis is present at C4-5. Uncovertebral disease is present bilaterally at C5-6 and C6-7. Other neck: The soft tissues of the neck demonstrate moderate atrophy of the thyroid. Salivary glands are within normal limits bilaterally. Mediastinal and thoracic inlet adenopathy again noted, recent described on CT of the chest. Upper chest: A ring-like lesion in the right upper lobe measures 18 mm, stable. No other focal nodules are visualized. Centrilobular emphysematous changes are present. Review of the MIP images confirms the above findings CTA HEAD FINDINGS Anterior circulation: Atherosclerotic calcifications  are present within the cavernous internal carotid arteries bilaterally. There is no significant stenosis relative to the ICA termini. The A1 and M1 segments are normal. Anterior communicating artery is patent. MCA bifurcations are intact bilaterally. There is a high-grade stenosis or nonocclusive thrombus involving the superior left M2 division proximally. Posterior circulation: Left vertebral artery is dominant. PICA origins are visualized and normal. Both posterior cerebral arteries originate from basilar tip. A left posterior communicating artery contributes. PCA branch vessels are within normal limits bilaterally. Venous sinuses: Dural sinuses are patent. Straight sinus deep cerebral veins are intact. Cortical veins are within normal limits. Anatomic variants: None Review of the MIP images confirms the above findings IMPRESSION: 1. High-grade stenosis versus nonocclusive thrombus involving the proximal left superior M2 division. 2. No other focal stenosis or occlusion. 3. No aneurysm. 4. Tortuosity of the cervical vasculature without significant stenosis. 5. Aortic Atherosclerosis (ICD10-I70.0). No aneurysm or stenosis at the aortic arch. Electronically Signed   By: San Morelle M.D.   On: 03/12/2018 15:37   [name] Head Wo Contrast  Result Date: 03/13/2018 CLINICAL DATA:  Stroke follow-up EXAM: MRI HEAD WITHOUT  CONTRAST MRA HEAD WITHOUT CONTRAST TECHNIQUE: Multiplanar, multiecho pulse sequences of the brain and surrounding structures were obtained without intravenous contrast. Angiographic images of the head were obtained using MRA technique without contrast. An abbreviated protocol was requested by the referring clinician, including time-of-flight MRA, axial diffusion-weighted imaging and axial susceptibility weighted imaging. COMPARISON:  None. FINDINGS: MRI HEAD FINDINGS BRAIN: There is abnormal diffusion restriction within the posterior left parietal lobe, with mild diffusion restriction in the  left basal ganglia. There is a small focus of mild diffusion restriction in the posterior right parietal lobe. No midline shift or other mass effect. Susceptibility weighted imaging shows no hemorrhage. MRA HEAD FINDINGS POSTERIOR CIRCULATION: --Basilar artery: Normal. --Posterior cerebral arteries: Normal. Both originate from the basilar artery. --Superior cerebellar arteries: Normal. --Inferior cerebellar arteries: Normal anterior and posterior inferior cerebellar arteries. ANTERIOR CIRCULATION: --Intracranial internal carotid arteries: Normal. --Anterior cerebral arteries: Normal. Both A1 segments are present. Patent anterior communicating artery. --Middle cerebral arteries: Normal. --Posterior communicating arteries: Present on the left, absent on the right. IMPRESSION: 1. Multifocal acute ischemia within the posterior left MCA distribution and within the left basal ganglia. 2. Small foci of ischemia within the right parietal lobe. 3. Normal intracranial MRA. Specifically, normal flow related enhancement of the left MCA and its branches. Electronically Signed   By: Ulyses Jarred M.D.   On: 03/13/2018 17:23   Mr Brain Wo Contrast  Result Date: 03/13/2018 CLINICAL DATA:  Stroke follow-up EXAM: MRI HEAD WITHOUT CONTRAST MRA HEAD WITHOUT CONTRAST TECHNIQUE: Multiplanar, multiecho pulse sequences of the brain and surrounding structures were obtained without intravenous contrast. Angiographic images of the head were obtained using MRA technique without contrast. An abbreviated protocol was requested by the referring clinician, including time-of-flight MRA, axial diffusion-weighted imaging and axial susceptibility weighted imaging. COMPARISON:  None. FINDINGS: MRI HEAD FINDINGS BRAIN: There is abnormal diffusion restriction within the posterior left parietal lobe, with mild diffusion restriction in the left basal ganglia. There is a small focus of mild diffusion restriction in the posterior right parietal lobe. No  midline shift or other mass effect. Susceptibility weighted imaging shows no hemorrhage. MRA HEAD FINDINGS POSTERIOR CIRCULATION: --Basilar artery: Normal. --Posterior cerebral arteries: Normal. Both originate from the basilar artery. --Superior cerebellar arteries: Normal. --Inferior cerebellar arteries: Normal anterior and posterior inferior cerebellar arteries. ANTERIOR CIRCULATION: --Intracranial internal carotid arteries: Normal. --Anterior cerebral arteries: Normal. Both A1 segments are present. Patent anterior communicating artery. --Middle cerebral arteries: Normal. --Posterior communicating arteries: Present on the left, absent on the right. IMPRESSION: 1. Multifocal acute ischemia within the posterior left MCA distribution and within the left basal ganglia. 2. Small foci of ischemia within the right parietal lobe. 3. Normal intracranial MRA. Specifically, normal flow related enhancement of the left MCA and its branches. Electronically Signed   By: Ulyses Jarred M.D.   On: 03/13/2018 17:23   Ct Head Code Stroke Wo Contrast`  Result Date: 03/12/2018 CLINICAL DATA:  Code stroke.  Slurred speech. EXAM: CT HEAD WITHOUT CONTRAST TECHNIQUE: Contiguous axial images were obtained from the base of the skull through the vertex without intravenous contrast. COMPARISON:  Head CT 03/05/2018 and MRI 03/03/2018 FINDINGS: Brain: Evolving subacute infarcts are again seen, small to moderate in size in the left parietal lobe and small in the left occipital lobe without evidence of interval infarct extension. Small right parietal and cerebellar infarcts on MRI are not well demonstrated. A small chronic left cerebellar infarct is again noted. No new infarct, intracranial hemorrhage, mass, midline shift, or extra-axial  fluid collection is identified. The ventricles and sulci are within normal limits for age. There is mild chronic small vessel ischemic disease affecting the cerebral white matter bilaterally. Vascular:  Calcified atherosclerosis at the skull base. No hyperdense vessel. Skull: No fracture or focal osseous lesion. Sinuses/Orbits: Visualized paranasal sinuses and mastoid air cells are clear. Bilateral cataract extraction is noted. Other: None. ASPECTS [facility] Stroke Program Early CT Score) Not scored given recent evolving infarcts. IMPRESSION: Evolving subacute infarcts as above without evidence of new intracranial abnormality. These results were communicated to Dr. Erlinda Hong at 2:53 pm on 03/12/2018 by text page via the [facility] messaging system. Electronically Signed   By: Logan Bores M.D.   On: 03/12/2018 14:53    Labs:  CBC: Recent Labs    03/11/18 0500 03/12/18 0820 03/13/18 1000 03/14/18 0202  WBC 3.7* 4.7 6.1 6.9  HGB 8.6* 9.6* 7.1* 8.5*  HCT 26.9* 30.9* 22.7* 27.0*  PLT 52* 58* 85* 78*    COAGS: Recent Labs    03/03/18 1243 03/09/18 0423  INR 0.90 1.01    BMP: Recent Labs    03/11/18 0500 03/12/18 0820 03/13/18 1000 03/14/18 0202  NA 140 138 138 140  K 3.9 3.9 4.1 3.8  CL 108 101 108 108  CO2 27 26 24 22   GLUCOSE 105* 112* 129* 106*  BUN 13 13 17 18   CALCIUM 8.8* 9.2 8.3* 8.7*  CREATININE 2.13* 2.39* 2.25* 2.35*  GFRNONAA 22* 19* 21* 19*  GFRAA 25* 22* 24* 23*    LIVER FUNCTION TESTS: Recent Labs    02/26/18 1636 03/03/18 1243 03/04/18 0500 03/12/18 0820  BILITOT 0.7 1.1 0.4 0.7  AST 15 16 15 19   ALT 10 9 9 10   ALKPHOS 74 83 61 76  PROT 5.1* 5.6* 4.6* 5.4*  ALBUMIN 3.0* 3.5 2.7* 3.2*    Assessment and Plan:  Left MCA distal M1 segment occlusion s/p emergent mechanical thrombectomy achieving a TICI 3 revascularization 03/12/2018 by Dr. Estanislado Pandy. Patient's condition improving- can spontaneously move all extremities, still with expressive aphasia but it has improved since yesterday. Right groin incision stable. Appreciate and agree with neurology management. IR to follow.   Electronically Signed: Earley Abide, PA-C 03/14/2018, 10:00 AM   I spent a  total of 25 Minutes at the the patient's bedside AND on the patient's hospital floor or unit, greater than 50% of which was counseling/coordinating care for left MCA distal M1 segment occlusion s/p revascularization.

## 2018-03-14 NOTE — Progress Notes (Signed)
Patient seen this morning. Having chest/abdominal pain. 12-lead EKG being performed at bedside. Troponin drawn. Received 1 unit PRBC yesterday with improvement of hemoglobin to 8.5. Platelets overall stable at 78,000. No bleeding. Remains on Aspirin 325 mg daily. No other anticoagulation has been started.  Case discussed with Dr. Julien Nordmann. Recommend starting anticoagulation due of risk of recurrent CVA. There is a risk of GI bleed given her new diagnosis of gastrica cancer, but hemoglobin can be monitored closely and transfuse as needed. Risks/benefits discussed with daughter. Will defer final decision of starting anticoagulation to Neurology.

## 2018-03-14 NOTE — Progress Notes (Signed)
CRITICAL VALUE ALERT  Critical Value:  Troponin 0.21  Date & Time Notied:  03/14/2018 at 1205  Provider Notified: Dr. Erlinda Hong    Orders Received/Actions taken: No new orders at this time.

## 2018-03-14 NOTE — IPOC Note (Signed)
Overall Plan of Care [facility]) Patient Details Name: Nancy Norris MRN: [id number] DOB: [birth year]  Admitting Diagnosis: <principal problem not specified>  Hospital Problems: Active Problems:   Subcortical infarction [facility])     Functional Problem List: Nursing Safety, Pain, Skin Integrity  PT Balance, Endurance, Motor, Safety, Sensory  OT Balance, Cognition, Endurance, Motor, Pain, Safety  SLP Cognition, Linguistic  TR         Basic ADL's: OT Eating, Grooming, Bathing, Dressing, Toileting     Advanced  ADL's: OT Simple Meal Preparation     Transfers: PT Bed Mobility, Bed to Chair, Car, Furniture, Futures trader, Metallurgist: PT Stairs, Emergency planning/management officer, Ambulation     Additional Impairments: OT Fuctional Use of Upper Extremity  SLP Social Cognition   Problem Solving, Memory  TR      Anticipated Outcomes Item Anticipated Outcome  Self Feeding Independent  Swallowing      Basic self-care  Mod I  Toileting  Mod I   Bathroom Transfers Mod I  Bowel/Bladder  min A  Transfers  Mod I with LRAD   Locomotion  Mod I with LRAD   Communication  Suprervision (written expression)  Cognition  Supervision  Pain  less than 3 out of 10   Safety/Judgment  min A   Therapy Plan: PT Intensity: Minimum of 1-2 x/day ,45 to 90 minutes PT Frequency: 5 out of 7 days PT Duration Estimated Length of Stay: 3-5 OT Intensity: Minimum of 1-2 x/day, 45 to 90 minutes OT Frequency: 5 out of 7 days OT Duration/Estimated Length of Stay: 5-7 days SLP Intensity: Minumum of 1-2 x/day, 30 to 90 minutes SLP Frequency: 3 to 5 out of 7 days SLP Duration/Estimated Length of Stay: original ELOS 7-10 days, however extension of infarct noted 2/26 PM and pt transferred to 4N thus ELOS will be better determined when pt returns to CIR    Team Interventions: Nursing Interventions Patient/Family Education, Pain Management, Medication Management, Dysphagia/Aspiration Precaution  Training, Skin Care/Wound Management  PT interventions Ambulation/gait training, Training and development officer, Community reintegration, Discharge planning, Disease management/prevention, DME/adaptive equipment instruction, Functional electrical stimulation, Cognitive remediation/compensation, Neuromuscular re-education, Functional mobility training, Pain management, Patient/family education, Psychosocial support, Splinting/orthotics, Therapeutic Activities, UE/LE Coordination activities, UE/LE Strength taining/ROM, Visual/perceptual remediation/compensation, Wheelchair propulsion/positioning, Skin care/wound management, Stair training, Therapeutic Exercise  OT Interventions Training and development officer, Cognitive remediation/compensation, Community reintegration, Discharge planning, Disease mangement/prevention, DME/adaptive equipment instruction, Functional mobility training, Neuromuscular re-education, Pain management, Patient/family education, Psychosocial support, Self Care/advanced ADL retraining, Skin care/wound managment, Splinting/orthotics, Therapeutic Activities, Therapeutic Exercise, UE/LE Strength taining/ROM, UE/LE Coordination activities, Visual/perceptual remediation/compensation  SLP Interventions Cognitive remediation/compensation, Internal/external aids, Functional tasks, Cueing hierarchy, Patient/family education, Other (comment)(written langauge facilitation)  TR Interventions    SW/CM Interventions Discharge Planning, Psychosocial Support, Patient/Family Education   Barriers to Discharge MD  Medical stability  Nursing      PT Medical stability    OT Decreased caregiver support    SLP      SW Decreased caregiver support, Medical stability Does not ahve 24 hr care   Team Discharge Planning: Destination: PT-Home ,OT- Home , SLP-Home Projected Follow-up: PT-Home health PT, OT-  Outpatient OT, SLP-24 hour supervision/assistance Projected Equipment Needs: PT-To be determined, OT-  None recommended by OT, SLP-None recommended by SLP Equipment Details: PT- , OT-  Patient/family involved in discharge planning: PT- Patient,  OT-Patient, SLP-Patient  MD ELOS: [facility] Prognosis:  Guarded Assessment: Unexpected transfer to acute <72hr post admit  See Team Conference Notes for weekly updates to the plan of care

## 2018-03-14 NOTE — Progress Notes (Signed)
ANTICOAGULATION CONSULT NOTE - Follow Up Consult  Pharmacy Consult for Eliquis Indication: atrial fibrillation  Allergies  Allergen Reactions  . Codeine Anaphylaxis    anaphylasix tolarates oxycodone per care cast  . Latex Shortness Of Breath  . Chocolate     migraines  . Onion Other (See Comments)    Causes migraine headaches  . Zyprexa [Olanzapine] Other (See Comments)    Confusion , dizzy,unsteady  . Adhesive [Tape] Other (See Comments)    blisters  . Heparin Rash    Can flush port with heparin  . Hydrocodone Rash    With extreme itching  . Iodinated Diagnostic Agents Hives, Itching, Rash and Other (See Comments)    Happened 60 years ago Stage IV kidney function  . Oxycodone Rash  . Promethazine-Dm Hives  . Sulfa Antibiotics Itching    Vital Signs: Temp: 98 F (36.7 C) (02/28 1100) Temp Source: Oral (02/28 1100) BP: 131/74 (02/28 1400) Pulse Rate: 64 (02/28 1400)  Labs: Recent Labs    03/12/18 0820 03/13/18 1000 03/14/18 0202 03/14/18 1048  HGB 9.6* 7.1* 8.5*  --   HCT 30.9* 22.7* 27.0*  --   PLT 58* 85* 78*  --   CREATININE 2.39* 2.25* 2.35*  --   TROPONINI  --   --   --  0.21*    Estimated Creatinine Clearance: 21.3 mL/min (A) (by C-G formula based on SCr of 2.35 mg/dL (H)).   Assessment: 78 year old female to begin Eliquis for Afib Filter in place for confirmed DVT  Goal of Therapy:  Monitor platelets by anticoagulation protocol: Yes   Plan:  Eliquis 5 mg po BID Watch for bleeding  Thank you Anette Guarneri, PharmD [phone]  03/14/2018,3:01 PM

## 2018-03-14 NOTE — Progress Notes (Signed)
Inpatient Rehabilitation-Admissions Coordinator   Will plan for pt to admit to CIR tomorrow, pending medical clearance and approval from PM&R MD, Dr. Letta Pate. Pt and family in agreement for CIR.   Please call if questions.   Jhonnie Garner, OTR/L  Rehab Admissions Coordinator  [phone] 03/14/2018 3:51 PM

## 2018-03-14 NOTE — Discharge Summary (Addendum)
Stroke Discharge Summary  Patient ID: Nancy Norris   MRN: [id number]      DOB: August 27, 1940  Date of Admission: 03/12/2018 Date of Discharge: 03/15/2018  Attending Physician:  Rosalin Hawking, MD, Stroke MD Consultant(s):  Curt Bears, MD ( hematology/oncology ), Willaim Rayas Nicole Kindred) Deveshwar, MD (Interventional Neuroradiologist) Cardiology - Dr Irish Lack Patient's PCP:  [name], MD  Discharge Diagnoses:  Principal Problem:   Middle cerebral artery embolism, left Active Problems:   HX: breast cancer   Hypothyroid   HTN (hypertension)   GERD (gastroesophageal reflux disease)   COPD (chronic obstructive pulmonary disease) with emphysema (HCC)   Mixed hyperlipidemia   Major depressive disorder, recurrent episode (HCC)   Multiple myeloma (HCC)   Thrombocytopenia (HCC)   Symptomatic anemia   CKD (chronic kidney disease), stage IV ([place])   Acute DVT (deep venous thrombosis) ([place])   Malignant neoplasm of stomach (HCC)   Pancytopenia ([place])   NSTEMI (non-ST elevated myocardial infarction) ([place])   Hypotension   Hypercoagulable state, secondary ([place])  Past Medical History:  Diagnosis Date  . Anemia   . Anginal pain ([place])    used NTG x 2 May 31 and 06/15/13   . Anxiety   . Arthritis   . B12 deficiency 12/04/2014  . Breast cancer ([place])   . Complication of anesthesia   . COPD (chronic obstructive pulmonary disease) ([place])   . 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 Depression   . Dizziness   . Dyspnea   . Fibromyalgia   . Fibromyalgia   . GERD (gastroesophageal reflux disease)   . Headache(784.0)   . Heart murmur   . Hemoptysis 10/21/2015  . History of blood transfusion    last one May 12   . Hx of cardiovascular stress test    LexiScan with low level exercise Myoview (02/2013): No ischemia, EF 72%; normal study  . Hx of echocardiogram    a.  Echocardiogram (12/26/2012): EF 41-93%, grade 1 diastolic dysfunction;   b.  Echocardiogram (02/2013): EF 55-60%, no WMA,  trivial effusion  . Hyperkalemia   . Hyponatremia   . Hypotension   . Hypothyroidism   . Mucositis   . Multiple myeloma   . Myocardial infarction [facility])    in past, patient was unaware.   . Neuropathy   . Nodule of left lung 09/13/2015  . Pain in joint, pelvic region and thigh 07/07/2015  . Pneumonia    several  . PONV (postoperative nausea and vomiting) 2008   after mastestomy   Past Surgical History:  Procedure Laterality Date  . ABDOMINAL HYSTERECTOMY  1981  . AV FISTULA PLACEMENT Left 06/19/2013   Procedure: CREATION OF LEFT ARM ARTERIOVENOUS (AV) FISTULA ;  Surgeon: Angelia Mould, MD;  Location: [place];  Service: Vascular;  Laterality: Left;  . BIOPSY  03/08/2018   Procedure: BIOPSY;  Surgeon: Wilford Corner, MD;  Location: [place];  Service: Endoscopy;;  . BREAST RECONSTRUCTION    . BREAST SURGERY Right    reduction  . CATARACT EXTRACTION, BILATERAL    . CHOLECYSTECTOMY  1971  . COLONOSCOPY    . ESOPHAGOGASTRODUODENOSCOPY (EGD) WITH PROPOFOL N/A 03/08/2018   Procedure: ESOPHAGOGASTRODUODENOSCOPY (EGD) WITH PROPOFOL;  Surgeon: Wilford Corner, MD;  Location: [place];  Service: Endoscopy;  Laterality: N/A;  . EYE SURGERY Bilateral    lens implant  . history of Port removal    . IR CT HEAD LTD  03/12/2018  . IR IVC FILTER PLMT / S&I /  IMG GUID/MOD SED  03/09/2018  . IR PERCUTANEOUS ART THROMBECTOMY/INFUSION INTRACRANIAL INC DIAG ANGIO  03/12/2018  . LUNG BIOPSY  10/21/2015  . MASTECTOMY Left 2008  . PORTACATH PLACEMENT  12/2012   has had 2  . RESECTION OF ARTERIOVENOUS FISTULA ANEURYSM Left 06/04/2017   Procedure: LIGATION ANEURYSM OF LEFT ARTERIOVENOUS FISTULA;  Surgeon: Angelia Mould, MD;  Location: [place];  Service: Vascular;  Laterality: Left;  . Status post stem cell transplant on September 28, 2008.    . TEE WITHOUT CARDIOVERSION N/A 03/07/2018   Procedure: TRANSESOPHAGEAL ECHOCARDIOGRAM (TEE);  Surgeon: Buford Dresser, MD;  Location: [facility]  ENDOSCOPY;  Service: Cardiovascular;  Laterality: N/A;       LABORATORY STUDIES CBC    Component Value Date/Time   WBC 4.9 03/15/2018 0448   RBC 2.57 (L) 03/15/2018 0448   HGB 8.2 (L) 03/15/2018 0448   HGB 8.6 (L) 02/26/2018 1154   HGB 11.2 (L) 01/17/2017 1146   HCT 25.7 (L) 03/15/2018 0448   HCT NOLAV 03/18/2017 1645   HCT 33.9 (L) 01/17/2017 1146   PLT 67 (L) 03/15/2018 0448   PLT 28 (L) 02/26/2018 1154   PLT 117 (L) 01/17/2017 1146   MCV 100.0 03/15/2018 0448   MCV 111.0 (H) 01/17/2017 1146   MCH 31.9 03/15/2018 0448   MCHC 31.9 03/15/2018 0448   RDW 21.4 (H) 03/15/2018 0448   RDW 14.5 01/17/2017 1146   LYMPHSABS 0.9 03/13/2018 1000   LYMPHSABS 1.0 01/17/2017 1146   MONOABS 0.4 03/13/2018 1000   MONOABS 0.6 01/17/2017 1146   EOSABS 0.0 03/13/2018 1000   EOSABS 0.1 01/17/2017 1146   BASOSABS 0.0 03/13/2018 1000   BASOSABS 0.1 01/17/2017 1146   CMP    Component Value Date/Time   NA 141 03/15/2018 0448   NA 144 01/17/2017 1146   K 3.5 03/15/2018 0448   K 3.7 01/17/2017 1146   CL 111 03/15/2018 0448   CL 105 03/19/2012 0811   CO2 19 (L) 03/15/2018 0448   CO2 25 01/17/2017 1146   GLUCOSE 85 03/15/2018 0448   GLUCOSE 103 01/17/2017 1146   GLUCOSE 94 03/19/2012 0811   BUN 17 03/15/2018 0448   BUN 35.0 (H) 01/17/2017 1146   CREATININE 2.29 (H) 03/15/2018 0448   CREATININE 2.36 (H) 02/26/2018 1154   CREATININE 3.16 (H) 12/18/2017 1137   CREATININE 2.8 (H) 01/17/2017 1146   CALCIUM 8.6 (L) 03/15/2018 0448   CALCIUM 9.1 01/17/2017 1146   PROT 5.4 (L) 03/12/2018 0820   PROT 6.4 01/17/2017 1146   ALBUMIN 3.2 (L) 03/12/2018 0820   ALBUMIN 3.9 01/17/2017 1146   AST 19 03/12/2018 0820   AST 9 (L) 02/26/2018 1154   AST 20 01/17/2017 1146   ALT 10 03/12/2018 0820   ALT 7 02/26/2018 1154   ALT 22 01/17/2017 1146   ALKPHOS 76 03/12/2018 0820   ALKPHOS 110 01/17/2017 1146   BILITOT 0.7 03/12/2018 0820   BILITOT 0.9 02/26/2018 1154   BILITOT 0.73 01/17/2017 1146    GFRNONAA 20 (L) 03/15/2018 0448   GFRNONAA 19 (L) 02/26/2018 1154   GFRNONAA 14 (L) 12/18/2017 1137   GFRAA 23 (L) 03/15/2018 0448   GFRAA 22 (L) 02/26/2018 1154   GFRAA 16 (L) 12/18/2017 1137   COAGS Lab Results  Component Value Date   INR 1.01 03/09/2018   INR 0.90 03/03/2018   INR 0.89 10/21/2015   Lipid Panel    Component Value Date/Time   CHOL 141 03/06/2018 0500   TRIG  151 (H) 03/06/2018 0500   HDL 26 (L) 03/06/2018 0500   CHOLHDL 5.4 03/06/2018 0500   VLDL 30 03/06/2018 0500   LDLCALC 85 03/06/2018 0500   LDLCALC 128 (H) 12/18/2017 1137   HgbA1C  Lab Results  Component Value Date   HGBA1C 5.1 03/06/2018   Urinalysis    Component Value Date/Time   COLORURINE STRAW (A) 03/03/2018 1340   APPEARANCEUR CLEAR 03/03/2018 1340   LABSPEC 1.004 (L) 03/03/2018 1340   LABSPEC 1.010 09/19/2016 0908   PHURINE 6.0 03/03/2018 1340   GLUCOSEU 50 (A) 03/03/2018 1340   GLUCOSEU Negative 09/19/2016 0908   HGBUR SMALL (A) 03/03/2018 1340   BILIRUBINUR NEGATIVE 03/03/2018 1340   BILIRUBINUR Negative 09/19/2016 0908   KETONESUR NEGATIVE 03/03/2018 1340   PROTEINUR NEGATIVE 03/03/2018 1340   UROBILINOGEN 0.2 09/19/2016 0908   NITRITE NEGATIVE 03/03/2018 1340   LEUKOCYTESUR NEGATIVE 03/03/2018 1340   LEUKOCYTESUR Negative 09/19/2016 0908   Urine Drug Screen No results found for: LABOPIA, COCAINSCRNUR, LABBENZ, AMPHETMU, THCU, LABBARB  Alcohol Level    Component Value Date/Time   ETH <10 03/03/2018 1244     SIGNIFICANT DIAGNOSTIC STUDIES Ct Head Code Stroke Wo Contrast` 03/12/2018 1453 Evolving subacute infarcts as above without evidence of new intracranial abnormality. These results were communicated to Dr. Erlinda Hong at 2:53 pm on 03/12/2018 by text page via the [facility] messaging system.   [place] Ir Percutaneous Art Thrombectomy/infusion Intracranial Inc Diag Angio 03/12/2018 Status post endovascular complete revascularization of occluded left middle cerebral artery M1  segment with 2 passes of the 5 mm x 33 mm Embotrap retrieval device achieving a TICI 3 revascularization.   Ct Angio Head W Or Wo Contrast Ct Angio Neck W Or Wo Contrast 03/12/2018 1537 1. High-grade stenosis versus nonocclusive thrombus involving the proximal left superior M2 division. 2. No other focal stenosis or occlusion. 3. No aneurysm. 4. Tortuosity of the cervical vasculature without significant stenosis. 5. Aortic Atherosclerosis (ICD10-I70.0). No aneurysm or stenosis at the aortic arch.   Mr Brain 15 Contrast [name] Head Wo Contrast 03/13/2018 1723 1. Multifocal acute ischemia within the posterior left MCA distribution and within the left basal ganglia. 2. Small foci of ischemia within the right parietal lobe. 3. Normal intracranial MRA. Specifically, normal flow related enhancement of the left MCA and its branches.    HISTORY OF PRESENT ILLNESS Nancy Norris is a 78 y.o. Caucasian female with PMH of remote breast cancer, COPD, CKD stage IV on chemo therapy, MI/CAD, multiple myeloma in remission, recently discharged from [facility] to CIR for bilateral cortical and subcortical infarcts. She was doing well in CIR and was able to ambulate and speak well. Last seen at baseline 1350 03/12/2018 (LKW). Around 1405 pt was found not able to speak with significant right sided weakness. Code stroke called. On arrival, pt had left gaze preference, right hemianopia, right facial droop and right hemiplegia. NIHSS = 21. CT no acute change but subacute infarcts. CTA head and neck showed left M2 high grade stenosis vs. Near occlusive thrombus. Pt was sent to IR for emergent mechanical thrombectomy. Her platelet 58 and was treated with platelet transfusion in preparation of procedure. She was not a tPA candidate d/t recent stroke, low PLT 58.  Pt was admitted on 03/03/18 for episodes of amnesia, confusion, and headache. CT showed left occipital and left frontal parietal infarcts. MRI confirmed multiple posterior  and anterior bilateral infarcts. MRA normal, CUS unremarkable. EF 60-65%. TEE small PFO. LDL 85 and  A1C 5.1. however, pt had pancytopenia, not AC candidate. No loop placed. She was discharged to CIR with ASA 342m and lipitor 20.  Found to have right lower extremity DVT, put on IVC filter.  While in CIR, patient has been doing well, gradually improving. However, CT chest/abd/pelvic showed a large fungating ulcerated proximal gastric mass suspicious for malignancy. She has been following with Dr. MEarlie Serverand output follow up has been arranged.    HOSPITAL COURSE Ms. STAREKA JHAVERIis a 78y.o. female with history of remote breast cancer, COPD, CKD stage IV on chemotherapy, MI/CAD, multiple myeloma in remission, recent stroke admitted for acute onset global aphasia and right hemiplegia. No tPA given due to recent stroke and thrombocytopenia.  CTA showed an ELVO L M2 high grade stenosis vs clot. She was taken for IR where she had TICI3 revascularization of the L M1 using embotraop.   Stroke:  left MCA infarct due to left M2 high-grade stenosis status post IR with TICI3 reperfusion, embolic pattern likely secondary to hypercoagulable state  Resultant mild expressive aphasia  CT no acute abnormality  CT head and neck right M2 occlusion versus high-grade stenosis with distal reconstitution  IR distal right M1 occlusion with TICI3 reperfusion  MRI multifocal L MCA/L BG infarct. Small R parietal lobe infarct.   MRA normal  2D Echo EF 60 to 65% on 03/10/2018  LDL 85  HgbA1c 5.1  None for VTE prophylaxis due to right LE DVT with IVC filter and anemia with thrombocytopenia  aspirin 325 mg daily prior to admission, now on ASA 327m  Dr. XuErlinda Hongiscussed with Dr. MoJulien Nordmannk [facility] of bleeding status. Will start Eliquis 2/28 pm. Stop Asprin.   Therapy recommendations:  CIR  Disposition:  Discharge  ? Hypercoagulable state  03/03/2018 MRI showed multiple  posterior and anterior bilateral infarcts.  03/13/2018 MRI showed multifocal L MCA, L BG infarcts along with small R parietal lobe infarct  LE venous Doppler showed right LE DVT, status post IVC filter  Left M2 thrombus on this admission status post IR  History of breast cancer, multiple myeloma and recent finding of GI mass  Concerning for hypercoagulable date  Dr. XuErlinda Hongiscussed with Dr. MoJulien Nordmannk to treat with [facility] ongoing monitoring of bleeding status  Will start eliquis 2/28 pm  History of stroke  admittedon 2/17/20forepisodes ofamnesia, confusion, and headache. CT showed left occipital and left frontal parietal infarcts. MRI confirmed multiple posterior and anterior bilateral infarcts.MRA normal, CUS unremarkable. EF 60-65%. TEE small PFO. LDL 85 and A1C 5.1. however, pt had pancytopenia, not AC candidate. No loop placed. She was discharged to CIR with ASA 32564mnd lipitor 20.  Right LE DVT  Confirmed on LE venous Doppler  Not a candidate for anticoagulation  Status post IVC filter  Malignancy  Remote history of breast cancer  Multiple myeloma in remission, last chemo a year ago  Recently under chemo for CKD stage IV  Recent finding of gastric mass  Following with Dr. MohJulien Nordmann outpatient  Anemia and thrombocytopenia  Hemoglobin 8.6-9.6-7.1-8.2  Likely due to blood loss related procedure  Platelet [id number]  Status post 1 unit platelet transfusion  CBC monitoring  Stop aspirin 325, start eliquis 2/28  Hypotension  Stable 130-140s  Resume Midodrine home meds  BP goal normotensive  Hyperlipidemia  Home meds: Lipitor 20  LDL 85, goal < 70  Now Lipitor 20 resumed  Continue statin at discharge  CKD stage  IV  Creatinine 2.35  Follows with outpatient nephrology  Close monitoring  Other Stroke Risk Factors  Advanced age  Former cigarette smoker, advised to stop smoking  Coronary artery disease/MI  Other  Active Problems  Depression on Wellbutrin and Celexa  Hypothyroidism  COPD  abd pain, resolved w/ tylenol and ultram.  Chest pain with elevated troponin enzymes -> cardiology consult Dr Irish Lack - "await treatment recommendations from oncology regarding malignancy" Pt .declined cardiac cath at this time. Avoid nephrotoxins. Possible clot to coronary arteries. For medical management. F/U Dr Radford Pax 03/15/2018.   DISCHARGE EXAM Blood pressure (!) 142/77, pulse 66, temperature 98.7 F (37.1 C), temperature source Oral, resp. rate 18, SpO2 97 %. General - Well nourished, well developed, complains of abd pain after drinking Ensure, appears in distress  Cardiovascular - Regular rate and rhythm.  Neuro - awake alert eyes open, orientated to hospital, self, people, but not orientated to name of the hospital or time. Still has hesitation of speech, and word finding difficulties. However, she was able to name 3/4 and able to repeat. Following simple commands. PERRL, EOMI, no gaze preference, able to track both sides, visual field full, slight right nasolabial fold flattening. Tongue midline. BUE and BLE symmetrical and at least 4/5. Sensation symmetrical. FTN intact but slower on the left. DTR 1+ and no babinski. Gait not tested.  Discharge Diet  Regular thin liquids  DISCHARGE PLAN  Disposition:  Transfer to [place] for ongoing PT, OT and ST  Eliquis (apixaban) daily for secondary stroke prevention.  Recommend ongoing risk factor control by Primary Care Physician at time of discharge from inpatient rehabilitation.  Follow-up [name], MD in 2 weeks following discharge from rehab.  Follow-up in [facility] in 4 weeks following discharge from rehab, office to schedule an appointment.   55 minutes were spent preparing discharge.  Lynee Rosenbach Estill Bamberg, MSN, APRN, ANVP-BC, AGPCNP-BC Advanced Practice Stroke Nurse [place] for Schedule & Pager information 03/15/2018 1:29 PM

## 2018-03-14 NOTE — Progress Notes (Addendum)
STROKE TEAM PROGRESS NOTE   SUBJECTIVE (INTERVAL HISTORY) [name] is at the bedside.  Nancy complains of abd pain after eating breakfast and drinking Ensure, which is new for her. Will stop Ensure and give pain meds. Neuro stable. Hope for d/c back to CIR soon.   OBJECTIVE Temp:  [98 F (36.7 C)-98.3 F (36.8 C)] 98 F (36.7 C) (02/28 1100) Pulse Rate:  [63-85] 64 (02/28 1400) Cardiac Rhythm: Normal sinus rhythm (02/28 1200) Resp:  [9-27] 15 (02/28 1400) BP: (104-156)/(56-82) 131/74 (02/28 1400) SpO2:  [95 %-100 %] 97 % (02/28 1400)  Recent Labs  Lab 03/13/18 1944 03/13/18 2323 03/14/18 0323 03/14/18 0810 03/14/18 1144  GLUCAP 117* 109* 109* 93 100*   Recent Labs  Lab 03/09/18 0423 03/10/18 0608 03/11/18 0500 03/12/18 0820 03/13/18 1000 03/14/18 0202  NA 137  --  140 138 138 140  K 3.6  --  3.9 3.9 4.1 3.8  CL 105  --  108 101 108 108  CO2 23  --  _0 GLUCOSE 102*  --  105* 112* 129* 106*  BUN 13  --  _1 CREATININE 2.34*  --  2.13* 2.39* 2.25* 2.35*  CALCIUM 8.3*  --  8.8* 9.2 8.3* 8.7*  MG  --  2.2  --   --   --   --    Recent Labs  Lab 03/12/18 0820  AST 19  ALT 10  ALKPHOS 76  BILITOT 0.7  PROT 5.4*  ALBUMIN 3.2*   Recent Labs  Lab 03/08/18 0532 03/09/18 0423 03/11/18 0500 03/12/18 0820 03/13/18 1000 03/14/18 0202  WBC 2.7* 3.3* 3.7* 4.7 6.1 6.9  NEUTROABS 1.2* 1.6* 2.4 2.5 4.8  --   HGB 7.3* 7.2* 8.6* 9.6* 7.1* 8.5*  HCT 24.1* 23.5* 26.9* 30.9* 22.7* 27.0*  MCV 103.4* 103.1* 101.5* 103.3* 101.8* 102.3*  PLT 72* 67* 52* 58* 85* 78*   Recent Labs  Lab 03/10/18 1123 03/10/18 1735 03/10/18 2353 03/14/18 1048  TROPONINI 2.06* 2.43* 2.15* 0.21*   No results for input(s): LABPROT, INR in the last 72 hours. No results for input(s): COLORURINE, LABSPEC, [place], GLUCOSEU, HGBUR, BILIRUBINUR, KETONESUR, PROTEINUR, UROBILINOGEN, NITRITE, LEUKOCYTESUR in the last 72 hours.  Invalid input(s): APPERANCEUR     Component  Value Date/Time   CHOL 141 03/06/2018 0500   TRIG 151 (H) 03/06/2018 0500   HDL 26 (L) 03/06/2018 0500   CHOLHDL 5.4 03/06/2018 0500   VLDL 30 03/06/2018 0500   LDLCALC 85 03/06/2018 0500   LDLCALC 128 (H) 12/18/2017 1137   Lab Results  Component Value Date   HGBA1C 5.1 03/06/2018   Ct Head Code Stroke Wo Contrast` 03/12/2018 1453 Evolving subacute infarcts as above without evidence of new intracranial abnormality. These results were communicated to Dr. Erlinda Hong at 2:53 pm on 03/12/2018 by text page via the [facility] messaging system.   [place] Ir Percutaneous Art Thrombectomy/infusion Intracranial Inc Diag Angio 03/12/2018 Status post endovascular complete revascularization of occluded left middle cerebral artery M1 segment with 2 passes of the 5 mm x 33 mm Embotrap retrieval device achieving a TICI 3 revascularization.   Ct Angio Head W Or Wo Contrast Ct Angio Neck W Or Wo Contrast 03/12/2018 1537 1. High-grade stenosis versus nonocclusive thrombus involving the proximal left superior M2 division. 2. No other focal stenosis or occlusion. 3. No aneurysm. 4. Tortuosity of the cervical vasculature without significant stenosis. 5. Aortic Atherosclerosis (ICD10-I70.0). No aneurysm or stenosis  at the aortic arch.   Mr Brain 78 Contrast [name] Head Wo Contrast 03/13/2018 1723 1. Multifocal acute ischemia within the posterior left MCA distribution and within the left basal ganglia. 2. Small foci of ischemia within the right parietal lobe. 3. Normal intracranial MRA. Specifically, normal flow related enhancement of the left MCA and its branches.    PHYSICAL EXAM General- Well nourished, well developed, complains of abd pain after drinking Ensure, appears in distress  Cardiovascular - Regular rate and rhythm.  Neuro - awake alert eyes open, orientated to hospital, self, people, but not orientated to name of the hospital or time.Still has hesitation of speech, and word finding  difficulties. However, she was able to name 3/4 and able to repeat. Following simple commands. PERRL, EOMI, no gaze preference, able to track both sides, visual field full, slight right nasolabial fold flattening. Tongue midline. BUE and BLE symmetrical and at least 4/5. Sensation symmetrical. FTN intact but slower on the left. DTR 1+ and no babinski. Gait not tested.   ASSESSMENT/PLAN Nancy Norris a 78 y.o.femalewith history of remote breast cancer, COPD, CKD stage IV on chemotherapy, MI/CAD, multiple myeloma in remission, recent strokeadmitted for acute onset global aphasia and right hemiplegia. No tPA given due torecent stroke and thrombocytopenia. CTA showed an ELVO L M2 high grade stenosis vs clot. She was taken for IR where she had TICI3 revascularization of the L M1 using embotraop.   Stroke:leftMCAinfarct due to left M2 high-grade stenosis status post IR withTICI3reperfusion,embolicpatternlikelysecondary tohypercoagulable state  Resultantmild expressive aphasia  CT no acute abnormality  CT head and neck right M2 occlusion versus high-grade stenosis with distal reconstitution  IR distal right M1 occlusion withTICI3reperfusion  MRImultifocal L MCA/L BG infarct. Small R parietal lobe infarct.   MRAnormal  2D EchoEF 60 to 65% on 03/10/2018  LDL85  HgbA1c5.1  Nonefor VTE prophylaxisdue to right LE DVT with IVC filter and anemia with thrombocytopenia  aspirin 325 mg dailyprior to admission, now onASA 360m. Dr. XErlinda Hongdiscussed with Dr. MJulien Nordmannok [facility] of bleeding status. Started Eliquis 2/28 pm. Stop Asprin.   Therapy recommendations:CIR  Disposition:pending - plans for CIR tomorrow if Hgb stable  ? Hypercoagulable state  03/03/2018 MRI showed multiple posterior and anterior bilateral infarcts.  03/13/2018 MRI showed multifocal L MCA, L BG infarcts along with small R parietal lobe infarct  LE venous  Doppler showed right LE DVT, status post IVC filter  Left M2 thrombus on this admission status post IR  History of breast cancer, multiple myeloma and recent finding of GI mass  Concerning for hypercoagulable date  Dr. XErlinda Hongdiscussed with Dr. MJulien Nordmannok [facility] of bleeding status  Started eliquis 2/28 pm  History of stroke  admittedon 2/17/20forepisodes ofamnesia, confusion, and headache. CT showed left occipital and left frontal parietal infarcts. MRI confirmed multiple posterior and anterior bilateral infarcts.MRA normal, CUS unremarkable. EF 60-65%. TEE small PFO. LDL 85 and A1C 5.1. however, pt had pancytopenia, not AC candidate. No loop placed. She was discharged to CIR with ASA 3280mand lipitor 20.  Right LE DVT  Confirmed on LE venous Doppler  Not a candidate for anticoagulation  Status post IVC filter  Malignancy  Remote history of breast cancer  Multiple myeloma in remission, last chemo a year ago  Recently under chemo for CKD stage IV  Recent finding of gastric mass  Following with Dr. MoJulien Nordmanns outpatient  Anemia and thrombocytopenia  Hemoglobin 8.6-9.6-7.1  Likely due to blood loss related procedure  Platelet 52-58-85  Status post 1 unit platelet transfusion  CBC monitoring  Stop aspirin 325, start eliquis 2/28  Hypotension  Stable130-140s  Resume Midodrine home meds  BP goal normotensive  Hyperlipidemia  Home meds:Lipitor 20  LDL85, goal < 70  NowLipitor 20 resumed  Continue statin at discharge  CKD stage IV  Creatinine 2.35  Follows with outpatient nephrology  Close monitoring  Other Stroke Risk Factors  Advanced age  Former cigarette smoker, advised to stop smoking  Coronary artery disease/MI  Other Active Problems  Depression on Wellbutrin and Celexa  Hypothyroidism  COPD  Elevated troponin 4 days ago. Seen by DR. Irish Lack who felt had clot to coronaries.  For medical management. Troponin now 0.21.  abd pain, resolved w/ tylenol and ultram.  Hospital day # 2  Burnetta Sabin, MSN, APRN, ANVP-BC, AGPCNP-BC Advanced Practice Stroke Nurse [place] for Schedule & Pager information 03/14/2018 3:54 PM   ATTENDING NOTE: I reviewed above note and agree with the assessment and plan. Pt was seen and examined.   Nancy Norris at bedside.  Nancy neuro stable, still lethargic and has mild expressive aphasia, otherwise neurologically intact.  Nancy complains of stomachache after taking Ensure.  Gave Tylenol and tramadol for pain management and Nancy stomachache resolved.  Hemoglobin yesterday 7.1, received 1 unit PRBC, this morning hemoglobin 8.5.  Platelet stable at 70 1:08 unit platelet transfusion 2 days ago.  Discussed with oncology NP, from neuro standpoint, anticoagulation can be initiated due to benefit outweigh risk of bleeding.  Will start Eliquis this afternoon, DC aspirin, close monitoring CBC.  PT/OT recommend CIR, plan for discharge to CIR tomorrow if clinically stable.  Rosalin Hawking, MD PhD Stroke Neurology 03/14/2018 6:07 PM  I spent  35 minutes in total face-to-face time with the Nancy, more than 50% of which was spent in counseling and coordination of care, reviewing test results, images and medication, and discussing the diagnosis of anemia, thrombocytopenia, stroke with left M2 occlusion status post IR, gastric cancer, multi-myeloma, DVT status post IVC filter, recent non-STEMI, treatment plan and potential prognosis. This Nancy's care requiresreview of multiple databases, neurological assessment, discussion with family, other specialists and medical decision making of high complexity.     To contact Stroke Continuity provider, please refer to [email or web address] After hours, contact General Neurology

## 2018-03-14 NOTE — Progress Notes (Signed)
Physical Therapy Treatment Patient Details Name: Nancy Norris MRN: [id number] DOB: Nov 04, 1940 Today's Date: 03/14/2018    History of Present Illness MAURITA HAVENER is a 78 y.o. Caucasian female with PMH of remote breast cancer, COPD, CKD stage IV on chemo therapy, MI/CAD, multiple myeloma in remission, recently discharged from [facility] to CIR for bilateral cortical and subcortical infarcts. Around 1405 pt was found not able to speak with significant right sided weakness.  CT no acute change but subacute infarcts. Per chart CTA head and neck showed left M2 high grade stenosis vs. Near occlusive thrombus and she went for emergent mechanical thrombectomy.    PT Comments    Patient progressing well towards PT goals. Tolerated gait training with Min A for balance/safety. Pt very slow to process and respond to questions. Pt with right visual field deficit, bumping onto things on right side of environment during gait training today. Seems to have some word finding difficulties. Not able to dual task during mobility today. Also noted to have some memory issues- not sure if these are baseline or not. VSS throughout. Eager to get back to rehab. Will follow.   Follow Up Recommendations  CIR;Supervision for mobility/OOB     Equipment Recommendations  Other (comment)(defer)    Recommendations for Other Services       Precautions / Restrictions Precautions Precautions: Fall Precaution Comments: Rt field cut Restrictions Weight Bearing Restrictions: No    Mobility  Bed Mobility Overal bed mobility: Needs Assistance Bed Mobility: Supine to Sit     Supine to sit: Min guard;HOB elevated     General bed mobility comments: Increased time and Min guard for safety/line management. No dizziness.   Transfers Overall transfer level: Needs assistance Equipment used: Rolling walker (2 wheeled) Transfers: Sit to/from Stand Sit to Stand: Min assist;+2 safety/equipment         General transfer  comment: Assist to power to standing with cues for hand placment/technique, increased time. transferred to chair post ambulation.   Ambulation/Gait Ambulation/Gait assistance: Min assist;+2 safety/equipment Gait Distance (Feet): 100 Feet Assistive device: Rolling walker (2 wheeled) Gait Pattern/deviations: Decreased step length - left;Decreased step length - right;Drifts right/left;Step-through pattern Gait velocity: decreased   General Gait Details: Slow, mildly unsteady gait running into bed and things in room with RW; able to progress RLE wihtout difficulty today. Difficulty with dual tasking. VSS throughout   Stairs             Wheelchair Mobility    Modified Rankin (Stroke Patients Only) Modified Rankin (Stroke Patients Only) Pre-Morbid Rankin Score: Slight disability Modified Rankin: Moderately severe disability     Balance Overall balance assessment: Needs assistance Sitting-balance support: Bilateral upper extremity supported;Feet unsupported Sitting balance-Leahy Scale: Good     Standing balance support: During functional activity Standing balance-Leahy Scale: Poor Standing balance comment: dependent on RW during standing and ambulation                            Cognition Arousal/Alertness: Awake/alert Behavior During Therapy: WFL for tasks assessed/performed Overall Cognitive Status: Impaired/Different from baseline Area of Impairment: Safety/judgement;Awareness;Problem solving;Attention                   Current Attention Level: Selective Memory: Decreased short-term memory   Safety/Judgement: Decreased awareness of deficits;Decreased awareness of safety Awareness: Emergent Problem Solving: Slow processing;Requires verbal cues General Comments: Extremely slow processing. Difficulty running into things in room on right side with RW;  not able to dual task, count during talking or count at rest.       Exercises      General  Comments General comments (skin integrity, edema, etc.): daughter present during session.       Pertinent Vitals/Pain Pain Assessment: No/denies pain    Home Living                      Prior Function            PT Goals (current goals can now be found in the care plan section) Progress towards PT goals: Progressing toward goals    Frequency    Min 4X/week      PT Plan Current plan remains appropriate    Co-evaluation              AM-PAC PT "6 Clicks" Mobility   Outcome Measure  Help needed turning from your back to your side while in a flat bed without using bedrails?: A Little Help needed moving from lying on your back to sitting on the side of a flat bed without using bedrails?: A Little Help needed moving to and from a bed to a chair (including a wheelchair)?: A Little Help needed standing up from a chair using your arms (e.g., wheelchair or bedside chair)?: A Little Help needed to walk in hospital room?: A Little Help needed climbing 3-5 steps with a railing? : A Lot 6 Click Score: 17    End of Session Equipment Utilized During Treatment: Gait belt Activity Tolerance: Patient tolerated treatment well Patient left: in chair;with call bell/phone within reach;with family/visitor present Nurse Communication: Mobility status PT Visit Diagnosis: Unsteadiness on feet (R26.81);Difficulty in walking, not elsewhere classified (R26.2);Other symptoms and signs involving the nervous system (R29.898)     Time: 1411-1440 PT Time Calculation (min) (ACUTE ONLY): 29 min  Charges:  $Gait Training: 23-37 mins                     Wray Kearns, [place], DPT Acute Rehabilitation Services Pager [phone] Office [phone]       [place] 03/14/2018, 4:02 PM

## 2018-03-15 ENCOUNTER — Other Ambulatory Visit: Payer: Self-pay

## 2018-03-15 ENCOUNTER — Inpatient Hospital Stay (HOSPITAL_COMMUNITY)
Admission: RE | Admit: 2018-03-15 | Discharge: 2018-03-26 | DRG: 056 | Disposition: A | Discharge status: Hospice | Payer: Medicare Other | Attending: Physical Medicine & Rehabilitation | Admitting: Physical Medicine & Rehabilitation

## 2018-03-15 DIAGNOSIS — Z7189 Other specified counseling: Secondary | ICD-10-CM

## 2018-03-15 DIAGNOSIS — Z66 Do not resuscitate: Secondary | ICD-10-CM | POA: Diagnosis not present

## 2018-03-15 DIAGNOSIS — I69391 Dysphagia following cerebral infarction: Secondary | ICD-10-CM | POA: Diagnosis not present

## 2018-03-15 DIAGNOSIS — K5900 Constipation, unspecified: Secondary | ICD-10-CM | POA: Diagnosis present

## 2018-03-15 DIAGNOSIS — R0789 Other chest pain: Secondary | ICD-10-CM

## 2018-03-15 DIAGNOSIS — C169 Malignant neoplasm of stomach, unspecified: Secondary | ICD-10-CM | POA: Diagnosis present

## 2018-03-15 DIAGNOSIS — E039 Hypothyroidism, unspecified: Secondary | ICD-10-CM | POA: Diagnosis present

## 2018-03-15 DIAGNOSIS — Z91041 Radiographic dye allergy status: Secondary | ICD-10-CM

## 2018-03-15 DIAGNOSIS — Z809 Family history of malignant neoplasm, unspecified: Secondary | ICD-10-CM

## 2018-03-15 DIAGNOSIS — Z79899 Other long term (current) drug therapy: Secondary | ICD-10-CM

## 2018-03-15 DIAGNOSIS — K219 Gastro-esophageal reflux disease without esophagitis: Secondary | ICD-10-CM | POA: Diagnosis present

## 2018-03-15 DIAGNOSIS — Z95828 Presence of other vascular implants and grafts: Secondary | ICD-10-CM

## 2018-03-15 DIAGNOSIS — I63512 Cerebral infarction due to unspecified occlusion or stenosis of left middle cerebral artery: Secondary | ICD-10-CM | POA: Diagnosis not present

## 2018-03-15 DIAGNOSIS — I82442 Acute embolism and thrombosis of left tibial vein: Secondary | ICD-10-CM | POA: Diagnosis present

## 2018-03-15 DIAGNOSIS — Z91048 Other nonmedicinal substance allergy status: Secondary | ICD-10-CM

## 2018-03-15 DIAGNOSIS — Z87891 Personal history of nicotine dependence: Secondary | ICD-10-CM

## 2018-03-15 DIAGNOSIS — Z9012 Acquired absence of left breast and nipple: Secondary | ICD-10-CM

## 2018-03-15 DIAGNOSIS — I639 Cerebral infarction, unspecified: Secondary | ICD-10-CM | POA: Diagnosis not present

## 2018-03-15 DIAGNOSIS — R4701 Aphasia: Secondary | ICD-10-CM

## 2018-03-15 DIAGNOSIS — R4182 Altered mental status, unspecified: Secondary | ICD-10-CM | POA: Diagnosis present

## 2018-03-15 DIAGNOSIS — E782 Mixed hyperlipidemia: Secondary | ICD-10-CM

## 2018-03-15 DIAGNOSIS — Z8349 Family history of other endocrine, nutritional and metabolic diseases: Secondary | ICD-10-CM

## 2018-03-15 DIAGNOSIS — Z515 Encounter for palliative care: Secondary | ICD-10-CM | POA: Diagnosis not present

## 2018-03-15 DIAGNOSIS — Q211 Atrial septal defect: Secondary | ICD-10-CM

## 2018-03-15 DIAGNOSIS — E876 Hypokalemia: Secondary | ICD-10-CM

## 2018-03-15 DIAGNOSIS — I6340 Cerebral infarction due to embolism of unspecified cerebral artery: Secondary | ICD-10-CM | POA: Diagnosis not present

## 2018-03-15 DIAGNOSIS — G8191 Hemiplegia, unspecified affecting right dominant side: Secondary | ICD-10-CM | POA: Diagnosis not present

## 2018-03-15 DIAGNOSIS — Z9049 Acquired absence of other specified parts of digestive tract: Secondary | ICD-10-CM

## 2018-03-15 DIAGNOSIS — D6869 Other thrombophilia: Secondary | ICD-10-CM | POA: Diagnosis not present

## 2018-03-15 DIAGNOSIS — Z91018 Allergy to other foods: Secondary | ICD-10-CM

## 2018-03-15 DIAGNOSIS — Z853 Personal history of malignant neoplasm of breast: Secondary | ICD-10-CM

## 2018-03-15 DIAGNOSIS — M797 Fibromyalgia: Secondary | ICD-10-CM | POA: Diagnosis present

## 2018-03-15 DIAGNOSIS — Z91040 Latex allergy status: Secondary | ICD-10-CM

## 2018-03-15 DIAGNOSIS — R531 Weakness: Secondary | ICD-10-CM | POA: Diagnosis not present

## 2018-03-15 DIAGNOSIS — G441 Vascular headache, not elsewhere classified: Secondary | ICD-10-CM | POA: Diagnosis not present

## 2018-03-15 DIAGNOSIS — D631 Anemia in chronic kidney disease: Secondary | ICD-10-CM | POA: Diagnosis present

## 2018-03-15 DIAGNOSIS — E785 Hyperlipidemia, unspecified: Secondary | ICD-10-CM | POA: Diagnosis present

## 2018-03-15 DIAGNOSIS — N184 Chronic kidney disease, stage 4 (severe): Secondary | ICD-10-CM

## 2018-03-15 DIAGNOSIS — R2689 Other abnormalities of gait and mobility: Secondary | ICD-10-CM | POA: Diagnosis not present

## 2018-03-15 DIAGNOSIS — I69320 Aphasia following cerebral infarction: Secondary | ICD-10-CM

## 2018-03-15 DIAGNOSIS — I951 Orthostatic hypotension: Secondary | ICD-10-CM

## 2018-03-15 DIAGNOSIS — I129 Hypertensive chronic kidney disease with stage 1 through stage 4 chronic kidney disease, or unspecified chronic kidney disease: Secondary | ICD-10-CM | POA: Diagnosis present

## 2018-03-15 DIAGNOSIS — F419 Anxiety disorder, unspecified: Secondary | ICD-10-CM | POA: Diagnosis present

## 2018-03-15 DIAGNOSIS — I10 Essential (primary) hypertension: Secondary | ICD-10-CM

## 2018-03-15 DIAGNOSIS — D6859 Other primary thrombophilia: Secondary | ICD-10-CM | POA: Diagnosis present

## 2018-03-15 DIAGNOSIS — Z79890 Hormone replacement therapy (postmenopausal): Secondary | ICD-10-CM

## 2018-03-15 DIAGNOSIS — Z7982 Long term (current) use of aspirin: Secondary | ICD-10-CM

## 2018-03-15 DIAGNOSIS — I69351 Hemiplegia and hemiparesis following cerebral infarction affecting right dominant side: Principal | ICD-10-CM

## 2018-03-15 DIAGNOSIS — F329 Major depressive disorder, single episode, unspecified: Secondary | ICD-10-CM | POA: Diagnosis present

## 2018-03-15 DIAGNOSIS — Z885 Allergy status to narcotic agent status: Secondary | ICD-10-CM

## 2018-03-15 DIAGNOSIS — Z888 Allergy status to other drugs, medicaments and biological substances status: Secondary | ICD-10-CM

## 2018-03-15 DIAGNOSIS — J449 Chronic obstructive pulmonary disease, unspecified: Secondary | ICD-10-CM | POA: Diagnosis present

## 2018-03-15 DIAGNOSIS — I252 Old myocardial infarction: Secondary | ICD-10-CM | POA: Diagnosis not present

## 2018-03-15 DIAGNOSIS — K5901 Slow transit constipation: Secondary | ICD-10-CM | POA: Diagnosis not present

## 2018-03-15 DIAGNOSIS — C801 Malignant (primary) neoplasm, unspecified: Secondary | ICD-10-CM

## 2018-03-15 DIAGNOSIS — I6602 Occlusion and stenosis of left middle cerebral artery: Secondary | ICD-10-CM

## 2018-03-15 DIAGNOSIS — I82409 Acute embolism and thrombosis of unspecified deep veins of unspecified lower extremity: Secondary | ICD-10-CM | POA: Diagnosis not present

## 2018-03-15 DIAGNOSIS — C9000 Multiple myeloma not having achieved remission: Secondary | ICD-10-CM | POA: Diagnosis present

## 2018-03-15 DIAGNOSIS — Z882 Allergy status to sulfonamides status: Secondary | ICD-10-CM

## 2018-03-15 DIAGNOSIS — Z90710 Acquired absence of both cervix and uterus: Secondary | ICD-10-CM

## 2018-03-15 DIAGNOSIS — K7689 Other specified diseases of liver: Secondary | ICD-10-CM | POA: Diagnosis not present

## 2018-03-15 DIAGNOSIS — D696 Thrombocytopenia, unspecified: Secondary | ICD-10-CM | POA: Diagnosis not present

## 2018-03-15 DIAGNOSIS — D649 Anemia, unspecified: Secondary | ICD-10-CM

## 2018-03-15 DIAGNOSIS — R112 Nausea with vomiting, unspecified: Secondary | ICD-10-CM | POA: Diagnosis not present

## 2018-03-15 DIAGNOSIS — R51 Headache: Secondary | ICD-10-CM | POA: Diagnosis not present

## 2018-03-15 LAB — CBC
HCT: 25.7 % — ABNORMAL LOW (ref 36.0–46.0)
Hemoglobin: 8.2 g/dL — ABNORMAL LOW (ref 12.0–15.0)
MCH: 31.9 pg (ref 26.0–34.0)
MCHC: 31.9 g/dL (ref 30.0–36.0)
MCV: 100 fL (ref 80.0–100.0)
Platelets: 67 10*3/uL — ABNORMAL LOW (ref 150–400)
RBC: 2.57 MIL/uL — ABNORMAL LOW (ref 3.87–5.11)
RDW: 21.4 % — ABNORMAL HIGH (ref 11.5–15.5)
WBC: 4.9 10*3/uL (ref 4.0–10.5)
nRBC: 0 % (ref 0.0–0.2)

## 2018-03-15 LAB — BASIC METABOLIC PANEL
Anion gap: 11 (ref 5–15)
BUN: 17 mg/dL (ref 8–23)
CO2: 19 mmol/L — ABNORMAL LOW (ref 22–32)
Calcium: 8.6 mg/dL — ABNORMAL LOW (ref 8.9–10.3)
Chloride: 111 mmol/L (ref 98–111)
Creatinine, Ser: 2.29 mg/dL — ABNORMAL HIGH (ref 0.44–1.00)
GFR calc Af Amer: 23 mL/min — ABNORMAL LOW (ref >60)
GFR calc non Af Amer: 20 mL/min — ABNORMAL LOW (ref >60)
Glucose, Bld: 85 mg/dL (ref 70–99)
Potassium: 3.5 mmol/L (ref 3.5–5.1)
Sodium: 141 mmol/L (ref 135–145)

## 2018-03-15 LAB — GLUCOSE, CAPILLARY: Glucose-Capillary: 88 mg/dL (ref 70–99)

## 2018-03-15 LAB — TROPONIN I
TROPONIN I: 0.21 ng/mL — AB (ref <0.03)
Troponin I: 0.16 ng/mL (ref <0.03)

## 2018-03-15 MED ORDER — CROMOLYN SODIUM 4 % OP SOLN: 1.0000 [drp] | Freq: Four times a day (QID) | OPHTHALMIC | Status: DC | PRN

## 2018-03-15 MED ORDER — MORPHINE SULFATE (PF) 2 MG/ML IV SOLN: 2.0000 mg | INTRAVENOUS | Status: DC | PRN

## 2018-03-15 MED ORDER — LEVOTHYROXINE SODIUM 75 MCG PO TABS
150.0000 ug | ORAL_TABLET | Freq: Every day | ORAL | Status: DC
  Administered 2018-03-16 – 2018-03-25 (×10): 150 ug via ORAL
  Filled 2018-03-15 (×10): qty 2

## 2018-03-15 MED ORDER — MORPHINE SULFATE (PF) 2 MG/ML IV SOLN
2.0000 mg | Freq: Once | INTRAVENOUS | Status: AC
  Administered 2018-03-15: 2 mg via INTRAVENOUS
  Filled 2018-03-15: qty 1

## 2018-03-15 MED ORDER — TRAMADOL HCL 50 MG PO TABS: 50.0000 mg | ORAL_TABLET | Freq: Four times a day (QID) | ORAL | Status: DC | PRN

## 2018-03-15 MED ORDER — LIDOCAINE VISCOUS HCL 2 % MT SOLN
15.0000 mL | Freq: Once | OROMUCOSAL | Status: AC
  Administered 2018-03-15: 15 mL via ORAL
  Filled 2018-03-15: qty 15

## 2018-03-15 MED ORDER — APIXABAN 5 MG PO TABS
5.0000 mg | ORAL_TABLET | Freq: Two times a day (BID) | ORAL | Status: DC
  Administered 2018-03-15 – 2018-03-23 (×17): 5 mg via ORAL
  Filled 2018-03-15 (×18): qty 1

## 2018-03-15 MED ORDER — VITAMIN B-12 1000 MCG PO TABS
1000.0000 ug | ORAL_TABLET | Freq: Every day | ORAL | Status: DC
  Administered 2018-03-16 – 2018-03-25 (×10): 1000 ug via ORAL
  Filled 2018-03-15 (×11): qty 1

## 2018-03-15 MED ORDER — IPRATROPIUM-ALBUTEROL 0.5-2.5 (3) MG/3ML IN SOLN: 3.0000 mL | RESPIRATORY_TRACT | Status: DC | PRN

## 2018-03-15 MED ORDER — ALUM & MAG HYDROXIDE-SIMETH 200-200-20 MG/5ML PO SUSP
30.0000 mL | Freq: Once | ORAL | Status: AC
  Administered 2018-03-15: 30 mL via ORAL
  Filled 2018-03-15: qty 30

## 2018-03-15 MED ORDER — CITALOPRAM HYDROBROMIDE 20 MG PO TABS
40.0000 mg | ORAL_TABLET | Freq: Every day | ORAL | Status: DC
  Administered 2018-03-16 – 2018-03-25 (×10): 40 mg via ORAL
  Filled 2018-03-15 (×10): qty 2

## 2018-03-15 MED ORDER — PANTOPRAZOLE SODIUM 40 MG PO TBEC
40.0000 mg | DELAYED_RELEASE_TABLET | Freq: Every day | ORAL | Status: DC
  Administered 2018-03-16 – 2018-03-25 (×10): 40 mg via ORAL
  Filled 2018-03-15 (×10): qty 1

## 2018-03-15 MED ORDER — NITROGLYCERIN 0.4 MG SL SUBL
SUBLINGUAL_TABLET | SUBLINGUAL | Status: AC
  Filled 2018-03-15: qty 1

## 2018-03-15 MED ORDER — BUPROPION HCL 75 MG PO TABS
75.0000 mg | ORAL_TABLET | Freq: Two times a day (BID) | ORAL | Status: DC
  Administered 2018-03-15 – 2018-03-25 (×20): 75 mg via ORAL
  Filled 2018-03-15 (×23): qty 1

## 2018-03-15 MED ORDER — ALBUTEROL SULFATE (2.5 MG/3ML) 0.083% IN NEBU: 3.0000 mL | INHALATION_SOLUTION | Freq: Four times a day (QID) | RESPIRATORY_TRACT | Status: DC | PRN

## 2018-03-15 MED ORDER — NITROGLYCERIN 0.4 MG SL SUBL
0.4000 mg | SUBLINGUAL_TABLET | SUBLINGUAL | Status: DC | PRN
  Administered 2018-03-15 (×2): 0.4 mg via SUBLINGUAL
  Filled 2018-03-15: qty 1

## 2018-03-15 MED ORDER — TRAMADOL HCL 50 MG PO TABS
50.0000 mg | ORAL_TABLET | Freq: Two times a day (BID) | ORAL | Status: DC | PRN
  Administered 2018-03-15 – 2018-03-21 (×3): 50 mg via ORAL
  Filled 2018-03-15 (×3): qty 1

## 2018-03-15 MED ORDER — FOLIC ACID 1 MG PO TABS
1.0000 mg | ORAL_TABLET | Freq: Every day | ORAL | Status: DC
  Administered 2018-03-16 – 2018-03-25 (×10): 1 mg via ORAL
  Filled 2018-03-15 (×10): qty 1

## 2018-03-15 MED ORDER — MIDODRINE HCL 5 MG PO TABS
10.0000 mg | ORAL_TABLET | Freq: Every day | ORAL | Status: DC
  Administered 2018-03-16 – 2018-03-25 (×10): 10 mg via ORAL
  Filled 2018-03-15 (×10): qty 2

## 2018-03-15 MED ORDER — ATORVASTATIN CALCIUM 10 MG PO TABS
20.0000 mg | ORAL_TABLET | Freq: Every day | ORAL | Status: DC
  Administered 2018-03-15 – 2018-03-24 (×10): 20 mg via ORAL
  Filled 2018-03-15 (×11): qty 2

## 2018-03-15 MED ORDER — ACETAMINOPHEN 325 MG PO TABS
650.0000 mg | ORAL_TABLET | ORAL | Status: DC | PRN
  Administered 2018-03-20 – 2018-03-24 (×10): 650 mg via ORAL
  Filled 2018-03-15 (×10): qty 2

## 2018-03-15 NOTE — Progress Notes (Signed)
Progress Note  Patient Name: Nancy Norris Date of Encounter: 03/15/2018  Primary Cardiologist: Jenkins Rouge, MD   Subjective   Had CP and abdomina pain yesterday in setting of anemia.  Received 1 U pRBCs.  Restarted on anticoagulation (Eliquis) due to risk of recurrent CVA.  Trop noted to be 0.21.  This am complained of crushing CP.  EKG with no acute ST changes.  No improvement with SL NTG.  CP radiated to right side and upper back. Sx improved with GI cocktail.    Inpatient Medications    Scheduled Meds: .  stroke: mapping our early stages of recovery book   Does not apply Once  . sodium chloride   Intravenous Once  . apixaban  5 mg Oral BID  . atorvastatin  20 mg Oral q1800  . buPROPion  75 mg Oral BID  . Chlorhexidine Gluconate Cloth  6 each Topical Daily  . citalopram  40 mg Oral Daily  . folic acid  1 mg Oral Daily  . levothyroxine  150 mcg Oral QAC breakfast  . midodrine  10 mg Oral Daily  . pantoprazole  40 mg Oral Daily  . senna-docusate  1 tablet Oral QHS  . sodium chloride flush  10-40 mL Intracatheter Q12H  . vitamin B-12  1,000 mcg Oral Daily   Continuous Infusions: . sodium chloride 40 mL/hr at 03/15/18 0500   PRN Meds: acetaminophen **OR** [DISCONTINUED] acetaminophen (TYLENOL) oral liquid 160 mg/5 mL **OR** [DISCONTINUED] acetaminophen, albuterol, cromolyn, ipratropium-albuterol, nitroGLYCERIN, sodium chloride flush, traMADol   Vital Signs    Vitals:   03/14/18 2322 03/15/18 0402 03/15/18 0502 03/15/18 0717  BP: (!) 154/87 (!) 148/77 115/79 (!) 144/76  Pulse: 73 72 68 75  Resp: '18 20 19 19  '$ Temp: 98.4 F (36.9 C) 98.2 F (36.8 C) 98.2 F (36.8 C) 99.1 F (37.3 C)  TempSrc: Oral Oral Oral Oral  SpO2: 97% 97%  96%    Intake/Output Summary (Last 24 hours) at 03/15/2018 1127 Last data filed at 03/15/2018 0600 Gross per 24 hour  Intake 1108.49 ml  Output -  Net 1108.49 ml   There were no vitals filed for this visit. Telemetry    NSR-  Personally Reviewed  ECG    No new EKG to review  Physical Exam   GEN: Well nourished, well developed in no acute distress HEENT: Normal NECK: No JVD; No carotid bruits LYMPHATICS: No lymphadenopathy CARDIAC:RRR, no murmurs, rubs, gallops RESPIRATORY:  Clear to auscultation without rales, wheezing or rhonchi  ABDOMEN: Soft, non-tender, non-distended MUSCULOSKELETAL:  No edema; No deformity  SKIN: Warm and dry NEUROLOGIC:  Alert and oriented x 3 PSYCHIATRIC:  Normal affect    Labs    Chemistry Recent Labs  Lab 03/12/18 0820 03/13/18 1000 03/14/18 0202 03/15/18 0448  NA 138 138 140 141  K 3.9 4.1 3.8 3.5  CL 101 108 108 111  CO2 '26 24 22 '$ 19*  GLUCOSE 112* 129* 106* 85  BUN '13 17 18 17  '$ CREATININE 2.39* 2.25* 2.35* 2.29*  CALCIUM 9.2 8.3* 8.7* 8.6*  PROT 5.4*  --   --   --   ALBUMIN 3.2*  --   --   --   AST 19  --   --   --   ALT 10  --   --   --   ALKPHOS 76  --   --   --   BILITOT 0.7  --   --   --  GFRNONAA 19* 21* 19* 20*  GFRAA 22* 24* 23* 23*  ANIONGAP '11 6 10 11    '$ Hematology Recent Labs  Lab 03/13/18 1000 03/14/18 0202 03/15/18 0448  WBC 6.1 6.9 4.9  RBC 2.23* 2.64* 2.57*  HGB 7.1* 8.5* 8.2*  HCT 22.7* 27.0* 25.7*  MCV 101.8* 102.3* 100.0  MCH 31.8 32.2 31.9  MCHC 31.3 31.5 31.9  RDW 21.5* 21.2* 21.4*  PLT 85* 78* 67*   Cardiac Enzymes Recent Labs  Lab 03/10/18 1735 03/10/18 2353 03/14/18 1048 03/15/18 0448  TROPONINI 2.43* 2.15* 0.21* 0.21*   No results for input(s): TROPIPOC in the last 168 hours.   BNPNo results for input(s): BNP, PROBNP in the last 168 hours.   DDimer No results for input(s): DDIMER in the last 168 hours.   Radiology    [name] Head Wo Contrast  Result Date: 03/13/2018 CLINICAL DATA:  Stroke follow-up EXAM: MRI HEAD WITHOUT CONTRAST MRA HEAD WITHOUT CONTRAST TECHNIQUE: Multiplanar, multiecho pulse sequences of the brain and surrounding structures were obtained without intravenous contrast. Angiographic images  of the head were obtained using MRA technique without contrast. An abbreviated protocol was requested by the referring clinician, including time-of-flight MRA, axial diffusion-weighted imaging and axial susceptibility weighted imaging. COMPARISON:  None. FINDINGS: MRI HEAD FINDINGS BRAIN: There is abnormal diffusion restriction within the posterior left parietal lobe, with mild diffusion restriction in the left basal ganglia. There is a small focus of mild diffusion restriction in the posterior right parietal lobe. No midline shift or other mass effect. Susceptibility weighted imaging shows no hemorrhage. MRA HEAD FINDINGS POSTERIOR CIRCULATION: --Basilar artery: Normal. --Posterior cerebral arteries: Normal. Both originate from the basilar artery. --Superior cerebellar arteries: Normal. --Inferior cerebellar arteries: Normal anterior and posterior inferior cerebellar arteries. ANTERIOR CIRCULATION: --Intracranial internal carotid arteries: Normal. --Anterior cerebral arteries: Normal. Both A1 segments are present. Patent anterior communicating artery. --Middle cerebral arteries: Normal. --Posterior communicating arteries: Present on the left, absent on the right. IMPRESSION: 1. Multifocal acute ischemia within the posterior left MCA distribution and within the left basal ganglia. 2. Small foci of ischemia within the right parietal lobe. 3. Normal intracranial MRA. Specifically, normal flow related enhancement of the left MCA and its branches. Electronically Signed   By: Ulyses Jarred M.D.   On: 03/13/2018 17:23   Mr Brain Wo Contrast  Result Date: 03/13/2018 CLINICAL DATA:  Stroke follow-up EXAM: MRI HEAD WITHOUT CONTRAST MRA HEAD WITHOUT CONTRAST TECHNIQUE: Multiplanar, multiecho pulse sequences of the brain and surrounding structures were obtained without intravenous contrast. Angiographic images of the head were obtained using MRA technique without contrast. An abbreviated protocol was requested by the  referring clinician, including time-of-flight MRA, axial diffusion-weighted imaging and axial susceptibility weighted imaging. COMPARISON:  None. FINDINGS: MRI HEAD FINDINGS BRAIN: There is abnormal diffusion restriction within the posterior left parietal lobe, with mild diffusion restriction in the left basal ganglia. There is a small focus of mild diffusion restriction in the posterior right parietal lobe. No midline shift or other mass effect. Susceptibility weighted imaging shows no hemorrhage. MRA HEAD FINDINGS POSTERIOR CIRCULATION: --Basilar artery: Normal. --Posterior cerebral arteries: Normal. Both originate from the basilar artery. --Superior cerebellar arteries: Normal. --Inferior cerebellar arteries: Normal anterior and posterior inferior cerebellar arteries. ANTERIOR CIRCULATION: --Intracranial internal carotid arteries: Normal. --Anterior cerebral arteries: Normal. Both A1 segments are present. Patent anterior communicating artery. --Middle cerebral arteries: Normal. --Posterior communicating arteries: Present on the left, absent on the right. IMPRESSION: 1. Multifocal acute ischemia within the posterior left MCA  distribution and within the left basal ganglia. 2. Small foci of ischemia within the right parietal lobe. 3. Normal intracranial MRA. Specifically, normal flow related enhancement of the left MCA and its branches. Electronically Signed   By: Ulyses Jarred M.D.   On: 03/13/2018 17:23    Cardiac Studies   TTE 03/10/2018  1. The left ventricle has normal systolic function, with an ejection fraction of 60-65%. The cavity size was normal. indeterminate.  2. The right ventricle has normal systolc function. The cavity was normal. There is no increase in right ventricular wall thickness.  3. The mitral valve is normal in structure. Mild thickening of the mitral valve leaflet.  4. The tricuspid valve was normal in structure.  5. The pulmonic valve was normal in structure.  Patient Profile      ANNALIZ AVEN is a 78 y.o. female with a history of multiple myeloma with pancytopenia, postural hypotension, fibromyalgia, COPD, and CKD stage IV followed by outpatient Nephrology, who is being seen for the evaluation of elevated troponin at the request of Dr. Erlinda Hong.  Assessment & Plan    NSTEMI - Troponin peaked at 2.4  - No new EKG but tele unremarkable  - Echo unchanged compared to prior  - Had recurrent CP/abdominal pain yesterday and this am with no new changes on EKG.  - Pain did not improve with SL NTG and resolved with GI cocktail - unlikely Cardiac in origin - Trop continues to trend downward (2.15 on 2/24 and 0.21 yesterday and today) - continue to cycle for now - Continue  Atorvastatin  - ASA stopped due to addition of DOAC and risk for bleeding with underlying gastric malignancy - Orthostatic hypotension limits the use of BB and ACE/ARB  - Now on Eliquis for recurrent CVA - Patient high risk for cardiac cath with comorbidies, recurrent CVA likely cardioembolic and renal function   Orthostatic Hypotension - Continue home Midodrine.   Gastric Mass -noted on endoscopy -path with poorly differentiated adeno CA of stomach -per oncology  Acute bilateral multifocal CVAs -PFO by TEE with acute LLE DVT s/p IVC filter -new CNS findings 3 days ago with CT showing new evolving infarct -s/p LCC arteriogram with complete revascularization of occluded left MCA distal M1 -Neuro following -not anticoagulation candidate due to pancytopenia  I have spent a total of 35 minutes with patient reviewing hospital notes, 2D echo, Head CT , telemetry, EKGs, labs and examining patient as well as establishing an assessment and plan that was discussed with the patient.  > 50% of time was spent in direct patient care.     For questions or updates, please contact [facility] Please consult [email or web address] for contact info under Cardiology/STEMI.   Signed, Fransico Him, MD  03/15/2018, 11:27 AM

## 2018-03-15 NOTE — Progress Notes (Signed)
Inpatient Rehabilitation-Admissions Coordinator   [facility] has received medical clearance for admit to CIR today. AC will notify RN and admit pt today.   Jhonnie Garner, OTR/L  Rehab Admissions Coordinator  [phone] 03/15/2018 1:08 PM

## 2018-03-15 NOTE — H&P (Signed)
Physical Medicine and Rehabilitation Admission H&P     :Chief complaint:Stomach ache   HPI: Nancy Norris is a 78 year old right-handed female she does have a history of multiple myeloma in remission followed by Dr. Earlie Server, remote breast cancer as well as COPD with remote tobacco abuse 13 years ago, medical history also complicated by chronic anemia with thrombocytopenia, fibromyalgia as well as CK D stage IV with creatinine 2.29-2.93 and hypotension maintained on ProAmatine. Per chart review she lives alone and was independent prior to admission she has a daughter in the area. One level home with ramped entrance. Patient initially presented 03/03/2018 with altered mental status difficulty swallowing as well as headache 2 weeks. Lactic acid 1.2, mildly elevated ammonia level. She was seen in the ED on 02/26/2018 for episodes of dizziness as well as headache and fall. CT of the head was negative. She had a hemoglobin of 7.9 with noted history of chronic anemia 8 urine culture showed greater than 100,000 Escherichia coli she was discharged to home after being placed on Keflex. On this latest admission cranial CT scan repeated showing 2 separate areas of abnormal cortical and subcortical edema in the left hemisphere affecting the left parietal lobe occipital lobes that were not present on 02/26/2018. MRI the brain showed left greater than right small to moderate size multifocal infarctions. Per report, acute ischemia within multi vascular territories in both hemispheres. MRA was unremarkable. Patient did not receive TPA. Her echocardiogram showed ejection fraction of 11% normal systolic function. Carotid Dopplers without ICA stenosis. Blood cultures positive for staph species and Proteus she was placed on antibiotic therapy with vancomycin and ceftriaxone 03/06/2018. A swallow study completed as well as esophagram 03/06/2018 showed nonspecific esophageal dysmotility no appreciable upper gastric diverticulum  and an initially on a regular diet. TEE completed 03/07/2018 with ejection fraction of 65% and no thrombus or vegetation. Small PFO seen by color Doppler. She did remain on aspirin for CVA prophylaxis. Gastroenterology services consulted for evaluation of noted abnormal barium swallow question GI bleed with noted history of anemia. An upper GI endoscopy completed 03/08/2020 per Dr. Michail Sermon showed a large fungating malignant appearing ulcerated proximal stomach mass that had areas of blood seen consistent with recent bleeding and and biopsies obtained. Mass likely causing partial obstruction which was contributing to her dysphagia. Recommendations were for CT of abdomen and pelvis completed showing marked wall thickening in the esophagogastric junction and proximal stomach consistent with known neoplasm. Bulky lymphadenopathy in the gastrohepatic ligament compatible with metastatic disease. There was a 2.7 cm hypoattenuating lesion in the spleen as well as scattered tiny pulmonary nodules measuring up to 5 mm and mild lymphadenopathy noted in the upper mediastinum 1.9 x 1.2 cm irregular lesion posterior left upper lobe at the site of a 4 cm mass that was also noted on PET scan 10/11/2015 features compatible with residual of that lesion. Oncology services Dr. Earlie Server follow-up in regards to findings pathology report remain pending current plan was to await biopsy and make recommendations about management of suspicious malignancy. Hospital course complicated by findings of acute DVT left posterior tibial vein and left peroneal vein radiology services consulted 03/09/2018 underwent IVC filter placement by Dr. Barbie Banner of interventional radiology. On 03/10/2018 cardiology again follow-up for nonspecific chest discomfort dizziness as well as elevated troponin 2.15-2.3. EKG did show new Q waves on her ECG in the inferior leads. Repeat echocardiogram showing ejection fraction 65%. The right ventricle normal systolic function  no increase in right ventricular wall thickness.  Therapy evaluations completed and patient was admitted to inpatient rehabilitation services 03/11/2018. On the afternoon of 03/12/2018 patient became more confused and garbled speech/expressive aphasia and bilateral upper extremity increased weakness as well as increased tone. Neurology service was consulted stat cranial CT scan obtained that showed evolving subacute infarctions without evidence of new intracranial abnormality. Patient was discharged to acute care services 03/12/2018 for further evaluation. CT angiogram of head and neck showed high-grade stenosis versus nonocclusive thrombus involving the proximal left superior M2 division. Interventional radiology consulted and underwent complete revascularization of occluded left MCA distal M1 occlusion.Ffter discussing with hematology oncology services in regards to left MCA infarction likely related to hypercoagulable state plan was to begin [facility] in place of aspirin therapy and watch for any bleeding tendencies.  Her diet remains a regular consistency. Pathology report of gastric mass returned poorly differentiated adenocarcinoma of the stomach and follow-up oncology services and plan to follow-up as outpatient. Therapy evaluations again have been completed after latest event and recommendations were to return to inpatient rehabilitation services for comprehensive therapies.  Episode of Chest pain this am, relieved by GI cocktail, not NTG.  No new EKG changes, , cycling troponins trending down,  Seen by cardiology who felt this was not a cardiac event.   Review of Systems  Constitutional: Negative for chills and fever.  HENT: Negative for hearing loss.   Eyes: Negative for blurred vision, double vision and photophobia.  Respiratory: Positive for hemoptysis. Negative for cough.        Shortness of breath on exertion  Cardiovascular: Positive for leg swelling. Negative for chest pain and palpitations.    Gastrointestinal: Positive for abdominal pain. Negative for vomiting.       GERD  Genitourinary: Negative for dysuria, flank pain and hematuria.  Musculoskeletal: Positive for back pain, falls, joint pain, myalgias and neck pain.  Skin: Negative for rash.  Neurological: Positive for dizziness, sensory change, speech change, focal weakness and headaches.       Dysphagia  Psychiatric/Behavioral: Positive for depression. The patient has insomnia.        Anxiety, altered mental status  All other systems reviewed and are negative.       Past Medical History:  Diagnosis Date  . Anemia    . Anginal pain ([place])      used NTG x 2 May 31 and 06/15/13   . Anxiety    . Arthritis    . B12 deficiency 12/04/2014  . Breast cancer ([place])    . Complication of anesthesia    . COPD (chronic obstructive pulmonary disease) ([place])    . Cryptococcal pneumonitis ([place]) 11/22/2015  . Depression    . Dizziness    . Dyspnea    . Fibromyalgia    . Fibromyalgia    . GERD (gastroesophageal reflux disease)    . Headache(784.0)    . Heart murmur    . Hemoptysis 10/21/2015  . History of blood transfusion      last one May 12   . Hx of cardiovascular stress test      LexiScan with low level exercise Myoview (02/2013): No ischemia, EF 72%; normal study  . Hx of echocardiogram      a.  Echocardiogram (12/26/2012): EF 48-18%, grade 1 diastolic dysfunction;   b.  Echocardiogram (02/2013): EF 55-60%, no WMA, trivial effusion  . Hyperkalemia    . Hyponatremia    . Hypotension    . Hypothyroidism    . Mucositis    .  Multiple myeloma    . Myocardial infarction [facility])      in past, patient was unaware.   . Neuropathy    . Nodule of left lung 09/13/2015  . Pain in joint, pelvic region and thigh 07/07/2015  . Pneumonia      several  . PONV (postoperative nausea and vomiting) 2008    after mastestomy         Past Surgical History:  Procedure Laterality Date  . ABDOMINAL HYSTERECTOMY   1981  . AV FISTULA PLACEMENT  Left 06/19/2013    Procedure: CREATION OF LEFT ARM ARTERIOVENOUS (AV) FISTULA ;  Surgeon: Angelia Mould, MD;  Location: [name];  Service: Vascular;  Laterality: Left;  . BIOPSY   03/08/2018    Procedure: BIOPSY;  Surgeon: Wilford Corner, MD;  Location: [place];  Service: Endoscopy;;  . BREAST RECONSTRUCTION      . BREAST SURGERY Right      reduction  . CATARACT EXTRACTION, BILATERAL      . CHOLECYSTECTOMY   1971  . COLONOSCOPY      . ESOPHAGOGASTRODUODENOSCOPY (EGD) WITH PROPOFOL N/A 03/08/2018    Procedure: ESOPHAGOGASTRODUODENOSCOPY (EGD) WITH PROPOFOL;  Surgeon: Wilford Corner, MD;  Location: [place];  Service: Endoscopy;  Laterality: N/A;  . EYE SURGERY Bilateral      lens implant  . history of Port removal      . IR CT HEAD LTD   03/12/2018  . IR IVC FILTER PLMT / S&I /IMG GUID/MOD SED   03/09/2018  . IR PERCUTANEOUS ART THROMBECTOMY/INFUSION INTRACRANIAL INC DIAG ANGIO   03/12/2018  . LUNG BIOPSY   10/21/2015  . MASTECTOMY Left 2008  . PORTACATH PLACEMENT   12/2012    has had 2  . RESECTION OF ARTERIOVENOUS FISTULA ANEURYSM Left 06/04/2017    Procedure: LIGATION ANEURYSM OF LEFT ARTERIOVENOUS FISTULA;  Surgeon: Angelia Mould, MD;  Location: [facility];  Service: Vascular;  Laterality: Left;  . Status post stem cell transplant on September 28, 2008.      . TEE WITHOUT CARDIOVERSION N/A 03/07/2018    Procedure: TRANSESOPHAGEAL ECHOCARDIOGRAM (TEE);  Surgeon: Buford Dresser, MD;  Location: [facility] ENDOSCOPY;  Service: Cardiovascular;  Laterality: N/A;         Family History  Problem Relation Age of Onset  . Arthritis Mother    . Asthma Mother    . Cancer Sister    . Hyperlipidemia Brother      Social History:  reports that she quit smoking about 13 years ago. Her smoking use included cigarettes. She has a 30.00 pack-year smoking history. She has never used smokeless tobacco. She reports that she does not drink alcohol or use drugs. Allergies:         Allergies  Allergen Reactions  . Codeine Anaphylaxis      anaphylasix tolarates oxycodone per care cast  . Latex Shortness Of Breath  . Chocolate        migraines  . Onion Other (See Comments)      Causes migraine headaches  . Zyprexa [Olanzapine] Other (See Comments)      Confusion , dizzy,unsteady  . Adhesive [Tape] Other (See Comments)      blisters  . Heparin Rash      Can flush port with heparin  . Hydrocodone Rash      With extreme itching  . Iodinated Diagnostic Agents Hives, Itching, Rash and Other (See Comments)      Happened 60 years ago Stage IV kidney  function  . Oxycodone Rash  . Promethazine-Dm Hives  . Sulfa Antibiotics Itching          Medications Prior to Admission  Medication Sig Dispense Refill  . acetaminophen (TYLENOL) 500 MG tablet Take 2 tablets (1,000 mg total) by mouth daily as needed for moderate pain or headache. 30 tablet 0  . albuterol (PROVENTIL HFA;VENTOLIN HFA) 108 (90 Base) MCG/ACT inhaler Inhale 1-2 puffs into the lungs every 6 (six) hours as needed for wheezing or shortness of breath (cough). 1 Inhaler 3  . aspirin EC 325 MG tablet Take 1 tablet (325 mg total) by mouth daily. 30 tablet 0  . atorvastatin (LIPITOR) 20 MG tablet Take 1 tablet (20 mg total) by mouth daily at 6 PM.      . azelastine (ASTELIN) 0.1 % nasal spray Place 1 spray into both nostrils daily. Use in each nostril as directed 30 mL 2  . buPROPion (WELLBUTRIN) 75 MG tablet Take 1 tablet (75 mg total) by mouth 2 (two) times daily. 60 tablet 2  . citalopram (CELEXA) 40 MG tablet Take 1 tablet daily for Mood 90 tablet 1  . cromolyn (OPTICROM) 4 % ophthalmic solution INSTILL 1 TO 2 DROPS IN AFFECTED EYE(S) 4 TIMES A DAY FOR ALLERGIES. (Patient taking differently: Place 1-2 drops into both eyes 4 (four) times daily as needed (allergies). ) 10 mL PRN  . feeding supplement, ENSURE ENLIVE, (ENSURE ENLIVE) LIQD Take 237 mLs by mouth 2 (two) times daily between meals. 237 mL 12  .  fluconazole (DIFLUCAN) 150 MG tablet Take one tablet at onset of symptoms and second tablet three days later for yeast. (Patient not taking: Reported on 03/03/2018) 2 tablet 0  . folic acid (FOLVITE) 1 MG tablet Take 1 tablet (1 mg total) by mouth daily. 30 tablet 3  . gabapentin (NEURONTIN) 300 MG capsule TAKE 1 CAPSULE BY MOUTH 3 TIMES DAILY. (Patient taking differently: Take 300-600 mg by mouth See admin instructions. 600 mg at bedtime and an additional 300 mg during the day if "cramping") 90 capsule 1  . ipratropium-albuterol (DUONEB) 0.5-2.5 (3) MG/3ML SOLN Take 3 mLs by nebulization every 4 (four) hours as needed. Max:6 doses per day 540 mL 0  . levocetirizine (XYZAL) 5 MG tablet Take 1 tablet (5 mg total) by mouth every evening. 90 tablet 3  . levothyroxine (SYNTHROID, LEVOTHROID) 150 MCG tablet TAKE 1 TABLET BY MOUTH DAILY. NEED OFFICE VISIT FOR FURTHER REFILLS. (Patient taking differently: Take by mouth daily before breakfast. TAKE 1 TABLET BY MOUTH DAILY. NEED OFFICE VISIT FOR FURTHER REFILLS.) 90 tablet 0  . loperamide (IMODIUM) 2 MG capsule Take 1 capsule (2 mg total) by mouth as needed for diarrhea or loose stools. 30 capsule 2  . magic mouthwash SOLN Take 5 mLs by mouth 4 (four) times daily as needed for mouth pain. 240 mL 1  . midodrine (PROAMATINE) 10 MG tablet Take 1 tablet daily to support BP (Patient taking differently: Take 10 mg by mouth daily. ) 90 tablet 3  . montelukast (SINGULAIR) 10 MG tablet Take 1 tablet daily for Allergies 90 tablet 1  . nitroGLYCERIN (NITROSTAT) 0.4 MG SL tablet Place 0.4 mg under the tongue every 5 (five) minutes as needed for chest pain.       Marland Kitchen nystatin (MYCOSTATIN/NYSTOP) powder Apply topically 2 (two) times daily. 30 g 0  . ondansetron (ZOFRAN ODT) 8 MG disintegrating tablet Take 1 tablet (8 mg total) by mouth every 8 (eight) hours as needed  for nausea or vomiting. 20 tablet 0  . pantoprazole (PROTONIX) 40 MG tablet Take 1 tablet (40 mg total) by mouth  daily.      . pilocarpine (SALAGEN) 5 MG tablet TAKE 1 TABLET BY MOUTH 3 TIMES DAILY FOR DRY MOUTH. (Patient taking differently: Take 5 mg by mouth 2 (two) times daily. ) 270 tablet 1  . ranitidine (ZANTAC) 300 MG tablet Take 1 tablet 2 x /day at [facility] & Bedtime for Heart burn & Reflux (Patient taking differently: Take 300 mg by mouth 2 (two) times daily. ) 180 tablet 3  . sucralfate (CARAFATE) 1 g tablet Take 1 tablet dissolved in water  4 x /day BEFORE Meals & Bedtime for Acid indigestion & Reflux (Patient taking differently: Take 1 g by mouth 4 (four) times daily -  with meals and at bedtime. Take 1 tablet dissolved in water  4 x /day BEFORE Meals & Bedtime for Acid indigestion & Reflux) 360 tablet 1  . traMADol (ULTRAM) 50 MG tablet Take 1 to 2 tablets by mouth 4 times a day as needed for pain. (Patient taking differently: Take 50-100 mg by mouth 4 (four) times daily as needed for moderate pain. Take 1 to 2 tablets by mouth 4 times a day as needed for pain.) 180 tablet 0  . umeclidinium-vilanterol (ANORO ELLIPTA) 62.5-25 MCG/INH AEPB Inhale 1 puff into the lungs daily as needed (shortness of breath). (Patient not taking: Reported on 03/03/2018) 1 each 1  . vitamin B-12 (CYANOCOBALAMIN) 1000 MCG tablet Take 1 tablet (1,000 mcg total) by mouth daily. (Patient taking differently: Take 2,000 mcg by mouth daily. ) 30 tablet 0      Drug Regimen Review Drug regimen was reviewed and remains appropriate with no significant issues identified   Home: Home Living Family/patient expects to be discharged to:: Inpatient rehab Living Arrangements: Alone Available Help at Discharge: Family, Available PRN/intermittently Type of Home: Apartment Home Access: Ramped entrance Home Layout: One level Bathroom Shower/Tub: Chiropodist: Standard Bathroom Accessibility: Yes Home Equipment: Environmental consultant - 2 wheels, Cane - single point, Civil engineer, contracting, Environmental consultant - 4 wheels  Lives With: Alone   Functional  History: Prior Function Level of Independence: Independent Comments: PTA (initial): Rides [employer] for transportation; walks without DME. Does cooking/cleaning. ; cancer center provides rides; enjoys "diamond" painting; enjoys crafts; does her own Field seismologist Status:  Mobility: Bed Mobility Overal bed mobility: Needs Assistance Bed Mobility: Supine to Sit Supine to sit: HOB elevated, Min assist General bed mobility comments: increased time, min A for line management - no physical assist required Transfers Overall transfer level: Needs assistance Equipment used: Rolling walker (2 wheeled) Transfers: Sit to/from Stand Sit to Stand: Min assist, +2 safety/equipment, From elevated surface General transfer comment: minA+2 for boost from bed, cuing for hand placement Ambulation/Gait Ambulation/Gait assistance: Min assist, +2 physical assistance, +2 safety/equipment Gait Distance (Feet): 10 Feet Assistive device: Rolling walker (2 wheeled) Gait Pattern/deviations: Decreased step length - left, Decreased step length - right, Drifts right/left General Gait Details: Pt able to walk slowly towards door with minA+2 for management of RW, tactile cues at R hip for R LE progression. Fatigued quickly. Gait velocity: decreased   ADL: ADL Overall ADL's : Needs assistance/impaired Eating/Feeding: Minimal assistance Eating/Feeding Details (indicate cue type and reason): able to bring cup to mouth Grooming: Set up, Sitting Grooming Details (indicate cue type and reason): dependent in BUE in standing Upper Body Bathing: Moderate assistance Lower Body  Bathing: Maximal assistance Upper Body Dressing : Moderate assistance, Sitting Lower Body Dressing: Maximal assistance, Sit to/from stand Toilet Transfer: Minimal assistance, +2 for safety/equipment, Ambulation, RW Toilet Transfer Details (indicate cue type and reason): simulated through recliner transfer Toileting-  Clothing Manipulation and Hygiene: Sit to/from stand, Moderate assistance Functional mobility during ADLs: Minimal assistance, +2 for safety/equipment, Rolling walker, Cueing for safety General ADL Comments: R inattention during functional tasks and mobility   Cognition: Cognition Overall Cognitive Status: Impaired/Different from baseline Arousal/Alertness: Awake/alert Orientation Level: Oriented X4 Attention: Sustained Sustained Attention: Appears intact Memory: (TBA) Awareness: Impaired Awareness Impairment: Emergent impairment, Anticipatory impairment Problem Solving: Impaired Problem Solving Impairment: Functional complex, Verbal complex Safety/Judgment: Impaired Cognition Arousal/Alertness: Awake/alert Behavior During Therapy: WFL for tasks assessed/performed Overall Cognitive Status: Impaired/Different from baseline Area of Impairment: Safety/judgement, Awareness, Problem solving, Attention Current Attention Level: Selective Safety/Judgement: Decreased awareness of deficits, Decreased awareness of safety Awareness: Emergent Problem Solving: Slow processing, Requires verbal cues General Comments: Pt required cues with navigating room with RW, trouble recalling where she worked (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 rate 13, SpO2 96 %. Physical Exam  Vitals reviewed. Constitutional:  78 year old right-handed female sitting up in bed  HENT:  Head: Normocephalic.  Eyes: Pupils are equal, round, and reactive to light. EOM are normal. Right eye exhibits no discharge. Left eye exhibits no discharge.  No gaze preference  Neck: Normal range of motion. Neck supple. No JVD present. No tracheal deviation present. No thyromegaly present.  Cardiovascular: Normal rate, regular rhythm, normal heart sounds and intact distal pulses.  Respiratory: Effort normal. No  respiratory distress. She has no wheezes.  GI: Soft. Bowel sounds are normal. She exhibits no distension. There is abdominal tenderness. There is rebound. There is no guarding.  Lymphadenopathy:    She has no cervical adenopathy.  Neurological: She displays abnormal reflex. She exhibits abnormal muscle tone.  Patient is alert. Makes eye contact with examiner. Her mood is flat but appropriate. She was able to provide her name with some hesitancy. Some delay in processing to provide her age. She could provide the name of the hospital. She cannot recall full events of her hospital stay. Follow simple one and two-step commands. She was able to name familiar objects.Patient withdrawal to deep stimuli. No Babinski MMT 4/5 in RUE and RLE, 5/5 LUE and LLE in delt , bi, tri, grip, HF, KE, ADF Skin: Skin is warm and dry. No erythema.      Lab Results Last 48 Hours        Results for orders placed or performed during the hospital encounter of 03/12/18 (from the past 48 hour(s))  MRSA PCR Screening     Status: None    Collection Time: 03/12/18  7:08 PM  Result Value Ref Range    MRSA by PCR NEGATIVE NEGATIVE      Comment:        The GeneXpert MRSA Assay (FDA approved for NASAL specimens only), is one component of a comprehensive MRSA colonization surveillance program. It is not intended to diagnose MRSA infection nor to guide or monitor treatment for MRSA infections. Performed at [facility], [place] [address], [place], [place] [address]    Glucose, capillary     Status: Abnormal    Collection Time: 03/12/18  7:43 PM  Result Value Ref Range    Glucose-Capillary 128 (H)  70 - 99 mg/dL  Glucose, capillary     Status: Abnormal    Collection Time: 03/12/18 11:25 PM  Result Value Ref Range    Glucose-Capillary 156 (H) 70 - 99 mg/dL  Glucose, capillary     Status: Abnormal    Collection Time: 03/13/18  3:52 AM  Result Value Ref Range    Glucose-Capillary 157 (H) 70 - 99 mg/dL  Glucose,  capillary     Status: Abnormal    Collection Time: 03/13/18  8:02 AM  Result Value Ref Range    Glucose-Capillary 145 (H) 70 - 99 mg/dL    Comment 1 Notify RN      Comment 2 Document in Chart    Basic metabolic panel     Status: Abnormal    Collection Time: 03/13/18 10:00 AM  Result Value Ref Range    Sodium 138 135 - 145 mmol/L    Potassium 4.1 3.5 - 5.1 mmol/L    Chloride 108 98 - 111 mmol/L    CO2 24 22 - 32 mmol/L    Glucose, Bld 129 (H) 70 - 99 mg/dL    BUN 17 8 - 23 mg/dL    Creatinine, Ser 2.25 (H) 0.44 - 1.00 mg/dL    Calcium 8.3 (L) 8.9 - 10.3 mg/dL    GFR calc non Af Amer 21 (L) >60 mL/min    GFR calc Af Amer 24 (L) >60 mL/min    Anion gap 6 5 - 15      Comment: Performed at [facility], [place] [address], [place], [place] [address]  CBC with Differential/Platelet     Status: Abnormal    Collection Time: 03/13/18 10:00 AM  Result Value Ref Range    WBC 6.1 4.0 - 10.5 K/uL    RBC 2.23 (L) 3.87 - 5.11 MIL/uL    Hemoglobin 7.1 (L) 12.0 - 15.0 g/dL      Comment: REPEATED TO VERIFY    HCT 22.7 (L) 36.0 - 46.0 %    MCV 101.8 (H) 80.0 - 100.0 fL    MCH 31.8 26.0 - 34.0 pg    MCHC 31.3 30.0 - 36.0 g/dL    RDW 21.5 (H) 11.5 - 15.5 %    Platelets 85 (L) 150 - 400 K/uL      Comment: REPEATED TO VERIFY Immature Platelet Fraction may be clinically indicated, consider ordering this additional test [id number] CONSISTENT WITH PREVIOUS RESULT      nRBC 0.0 0.0 - 0.2 %    Neutrophils Relative % 79 %    Neutro Abs 4.8 1.7 - 7.7 K/uL    Lymphocytes Relative 14 %    Lymphs Abs 0.9 0.7 - 4.0 K/uL    Monocytes Relative 6 %    Monocytes Absolute 0.4 0.1 - 1.0 K/uL    Eosinophils Relative 0 %    Eosinophils Absolute 0.0 0.0 - 0.5 K/uL    Basophils Relative 0 %    Basophils Absolute 0.0 0.0 - 0.1 K/uL    Immature Granulocytes 1 %    Abs Immature Granulocytes 0.06 0.00 - 0.07 K/uL      Comment: Performed at [place] [address], [place], [place] [address]    Glucose, capillary     Status: Abnormal    Collection Time: 03/13/18 11:32 AM  Result Value Ref Range    Glucose-Capillary 138 (H) 70 - 99 mg/dL    Comment 1 Notify RN      Comment 2  Document in Chart    Glucose, capillary     Status: Abnormal    Collection Time: 03/13/18  3:54 PM  Result Value Ref Range    Glucose-Capillary 123 (H) 70 - 99 mg/dL    Comment 1 Notify RN      Comment 2 Document in Chart    Prepare RBC     Status: None    Collection Time: 03/13/18  6:45 PM  Result Value Ref Range    Order Confirmation          ORDER PROCESSED BY BLOOD BANK Performed at [facility], [place] [address] [place], [place] [address]    Type and screen [place]     Status: None    Collection Time: 03/13/18  6:45 PM  Result Value Ref Range    ABO/RH(D) O POS      Antibody Screen NEG      Sample Expiration 03/16/2018      Unit Number [id number]      Blood Component Type RBC, LR IRR      Unit division 00      Status of Unit ISSUED,FINAL      Transfusion Status OK TO TRANSFUSE      Crossmatch Result          Compatible Performed at [facility], [place] [address], [place], [place] [address]    Glucose, capillary     Status: Abnormal    Collection Time: 03/13/18  7:44 PM  Result Value Ref Range    Glucose-Capillary 117 (H) 70 - 99 mg/dL  Glucose, capillary     Status: Abnormal    Collection Time: 03/13/18 11:23 PM  Result Value Ref Range    Glucose-Capillary 109 (H) 70 - 99 mg/dL  CBC     Status: Abnormal    Collection Time: 03/14/18  2:02 AM  Result Value Ref Range    WBC 6.9 4.0 - 10.5 K/uL    RBC 2.64 (L) 3.87 - 5.11 MIL/uL    Hemoglobin 8.5 (L) 12.0 - 15.0 g/dL      Comment: POST TRANSFUSION SPECIMEN    HCT 27.0 (L) 36.0 - 46.0 %    MCV 102.3 (H) 80.0 - 100.0 fL    MCH 32.2 26.0 - 34.0 pg    MCHC 31.5 30.0 - 36.0 g/dL    RDW 21.2 (H) 11.5 - 15.5 %    Platelets 78 (L) 150 - 400 K/uL      Comment: Immature Platelet Fraction may be clinically  indicated, consider ordering this additional test [id number] CONSISTENT WITH PREVIOUS RESULT      nRBC 0.0 0.0 - 0.2 %      Comment: Performed at [facility], [place] [address], [place], [place] [address]  Basic metabolic panel     Status: Abnormal    Collection Time: 03/14/18  2:02 AM  Result Value Ref Range    Sodium 140 135 - 145 mmol/L    Potassium 3.8 3.5 - 5.1 mmol/L    Chloride 108 98 - 111 mmol/L    CO2 22 22 - 32 mmol/L    Glucose, Bld 106 (H) 70 - 99 mg/dL    BUN 18 8 - 23 mg/dL    Creatinine, Ser 2.35 (H) 0.44 - 1.00 mg/dL    Calcium 8.7 (L) 8.9 - 10.3 mg/dL    GFR calc non Af Amer 19 (L) >60 mL/min    GFR calc Af Amer 23 (  L) >60 mL/min    Anion gap 10 5 - 15      Comment: Performed at [facility] [address] [place], [place] [address]  Iron and TIBC     Status: Abnormal    Collection Time: 03/14/18  2:02 AM  Result Value Ref Range    Iron 192 (H) 28 - 170 ug/dL    TIBC 218 (L) 250 - 450 ug/dL    Saturation Ratios 88 (H) 10.4 - 31.8 %    UIBC 26 ug/dL      Comment: Performed at [facility] [address] [place], [place] [address]  Ferritin     Status: Abnormal    Collection Time: 03/14/18  2:02 AM  Result Value Ref Range    Ferritin 924 (H) 11 - 307 ng/mL      Comment: Performed at [facility], [place] [address], [place], [place] [address]  Glucose, capillary     Status: Abnormal    Collection Time: 03/14/18  3:23 AM  Result Value Ref Range    Glucose-Capillary 109 (H) 70 - 99 mg/dL  Glucose, capillary     Status: None    Collection Time: 03/14/18  8:10 AM  Result Value Ref Range    Glucose-Capillary 93 70 - 99 mg/dL  Glucose, capillary     Status: Abnormal    Collection Time: 03/14/18 11:44 AM  Result Value Ref Range    Glucose-Capillary 100 (H) 70 - 99 mg/dL    *Note: Due to a large number of results and/or encounters for the requested time period, some results have not been displayed. A complete set of results can be found in  Results Review.       Imaging Results (Last 48 hours)  Ct Angio Head W Or Wo Contrast   Result Date: 03/12/2018 CLINICAL DATA:  Stroke, follow-up. Recent acute nonhemorrhagic infarcts involving the medial left occipital lobe and bilateral parietal lobes. Punctate acute infarct in the posterior left frontal lobe. EXAM: CT ANGIOGRAPHY HEAD AND NECK TECHNIQUE: Multidetector CT imaging of the head and neck was performed using the standard protocol during bolus administration of intravenous contrast. Multiplanar CT image reconstructions and MIPs were obtained to evaluate the vascular anatomy. Carotid stenosis measurements (when applicable) are obtained utilizing NASCET criteria, using the distal internal carotid diameter as the denominator. CONTRAST:  78m ISOVUE-370 IOPAMIDOL (ISOVUE-370) INJECTION 76% COMPARISON:  MRI of the brain and MRA [place] 03/03/2018. CT head without contrast 03/12/2018. FINDINGS: CTA NECK FINDINGS Aortic arch: A 3 vessel arch configuration is present. Atherosclerotic changes are noted at the origin of the left subclavian artery without significant stenosis. Additional calcifications are present in the distal arch. There is no aneurysm. Right carotid system: The right common carotid artery is tortuous proximally. Minimal atherosclerotic changes are noted at the right carotid bifurcation. There is no significant stenosis. There is mild tortuosity of the cervical right ICA without significant stenosis. Left carotid system: The left common carotid artery is mildly tortuous without significant stenosis. The bifurcation is unremarkable. There is moderate tortuosity of the more distal cervical left ICA without a significant stenosis. Vertebral arteries: The left vertebral artery is slightly dominant to the right. Both vertebral arteries originate from the subclavian arteries without significant stenosis. There is no significant stenosis or vascular injury to either vertebral artery in  the neck. Skeleton: Levoconvex curvature is present in the lower cervical spine. Grade 1 degenerative anterolisthesis is present at C4-5. Uncovertebral disease is  present bilaterally at C5-6 and C6-7. Other neck: The soft tissues of the neck demonstrate moderate atrophy of the thyroid. Salivary glands are within normal limits bilaterally. Mediastinal and thoracic inlet adenopathy again noted, recent described on CT of the chest. Upper chest: A ring-like lesion in the right upper lobe measures 18 mm, stable. No other focal nodules are visualized. Centrilobular emphysematous changes are present. Review of the MIP images confirms the above findings CTA HEAD FINDINGS Anterior circulation: Atherosclerotic calcifications are present within the cavernous internal carotid arteries bilaterally. There is no significant stenosis relative to the ICA termini. The A1 and M1 segments are normal. Anterior communicating artery is patent. MCA bifurcations are intact bilaterally. There is a high-grade stenosis or nonocclusive thrombus involving the superior left M2 division proximally. Posterior circulation: Left vertebral artery is dominant. PICA origins are visualized and normal. Both posterior cerebral arteries originate from basilar tip. A left posterior communicating artery contributes. PCA branch vessels are within normal limits bilaterally. Venous sinuses: Dural sinuses are patent. Straight sinus deep cerebral veins are intact. Cortical veins are within normal limits. Anatomic variants: None Review of the MIP images confirms the above findings IMPRESSION: 1. High-grade stenosis versus nonocclusive thrombus involving the proximal left superior M2 division. 2. No other focal stenosis or occlusion. 3. No aneurysm. 4. Tortuosity of the cervical vasculature without significant stenosis. 5. Aortic Atherosclerosis (ICD10-I70.0). No aneurysm or stenosis at the aortic arch. Electronically Signed   By: San Morelle M.D.   On:  03/12/2018 15:37    Ct Angio Neck W Or Wo Contrast   Result Date: 03/12/2018 CLINICAL DATA:  Stroke, follow-up. Recent acute nonhemorrhagic infarcts involving the medial left occipital lobe and bilateral parietal lobes. Punctate acute infarct in the posterior left frontal lobe. EXAM: CT ANGIOGRAPHY HEAD AND NECK TECHNIQUE: Multidetector CT imaging of the head and neck was performed using the standard protocol during bolus administration of intravenous contrast. Multiplanar CT image reconstructions and MIPs were obtained to evaluate the vascular anatomy. Carotid stenosis measurements (when applicable) are obtained utilizing NASCET criteria, using the distal internal carotid diameter as the denominator. CONTRAST:  56m ISOVUE-370 IOPAMIDOL (ISOVUE-370) INJECTION 76% COMPARISON:  MRI of the brain and MRA [place] 03/03/2018. CT head without contrast 03/12/2018. FINDINGS: CTA NECK FINDINGS Aortic arch: A 3 vessel arch configuration is present. Atherosclerotic changes are noted at the origin of the left subclavian artery without significant stenosis. Additional calcifications are present in the distal arch. There is no aneurysm. Right carotid system: The right common carotid artery is tortuous proximally. Minimal atherosclerotic changes are noted at the right carotid bifurcation. There is no significant stenosis. There is mild tortuosity of the cervical right ICA without significant stenosis. Left carotid system: The left common carotid artery is mildly tortuous without significant stenosis. The bifurcation is unremarkable. There is moderate tortuosity of the more distal cervical left ICA without a significant stenosis. Vertebral arteries: The left vertebral artery is slightly dominant to the right. Both vertebral arteries originate from the subclavian arteries without significant stenosis. There is no significant stenosis or vascular injury to either vertebral artery in the neck. Skeleton: Levoconvex  curvature is present in the lower cervical spine. Grade 1 degenerative anterolisthesis is present at C4-5. Uncovertebral disease is present bilaterally at C5-6 and C6-7. Other neck: The soft tissues of the neck demonstrate moderate atrophy of the thyroid. Salivary glands are within normal limits bilaterally. Mediastinal and thoracic inlet adenopathy again noted, recent described on CT of the chest. Upper chest: A ring-like  lesion in the right upper lobe measures 18 mm, stable. No other focal nodules are visualized. Centrilobular emphysematous changes are present. Review of the MIP images confirms the above findings CTA HEAD FINDINGS Anterior circulation: Atherosclerotic calcifications are present within the cavernous internal carotid arteries bilaterally. There is no significant stenosis relative to the ICA termini. The A1 and M1 segments are normal. Anterior communicating artery is patent. MCA bifurcations are intact bilaterally. There is a high-grade stenosis or nonocclusive thrombus involving the superior left M2 division proximally. Posterior circulation: Left vertebral artery is dominant. PICA origins are visualized and normal. Both posterior cerebral arteries originate from basilar tip. A left posterior communicating artery contributes. PCA branch vessels are within normal limits bilaterally. Venous sinuses: Dural sinuses are patent. Straight sinus deep cerebral veins are intact. Cortical veins are within normal limits. Anatomic variants: None Review of the MIP images confirms the above findings IMPRESSION: 1. High-grade stenosis versus nonocclusive thrombus involving the proximal left superior M2 division. 2. No other focal stenosis or occlusion. 3. No aneurysm. 4. Tortuosity of the cervical vasculature without significant stenosis. 5. Aortic Atherosclerosis (ICD10-I70.0). No aneurysm or stenosis at the aortic arch. Electronically Signed   By: San Morelle M.D.   On: 03/12/2018 15:37    [name] Head  Wo Contrast   Result Date: 03/13/2018 CLINICAL DATA:  Stroke follow-up EXAM: MRI HEAD WITHOUT CONTRAST MRA HEAD WITHOUT CONTRAST TECHNIQUE: Multiplanar, multiecho pulse sequences of the brain and surrounding structures were obtained without intravenous contrast. Angiographic images of the head were obtained using MRA technique without contrast. An abbreviated protocol was requested by the referring clinician, including time-of-flight MRA, axial diffusion-weighted imaging and axial susceptibility weighted imaging. COMPARISON:  None. FINDINGS: MRI HEAD FINDINGS BRAIN: There is abnormal diffusion restriction within the posterior left parietal lobe, with mild diffusion restriction in the left basal ganglia. There is a small focus of mild diffusion restriction in the posterior right parietal lobe. No midline shift or other mass effect. Susceptibility weighted imaging shows no hemorrhage. MRA HEAD FINDINGS POSTERIOR CIRCULATION: --Basilar artery: Normal. --Posterior cerebral arteries: Normal. Both originate from the basilar artery. --Superior cerebellar arteries: Normal. --Inferior cerebellar arteries: Normal anterior and posterior inferior cerebellar arteries. ANTERIOR CIRCULATION: --Intracranial internal carotid arteries: Normal. --Anterior cerebral arteries: Normal. Both A1 segments are present. Patent anterior communicating artery. --Middle cerebral arteries: Normal. --Posterior communicating arteries: Present on the left, absent on the right. IMPRESSION: 1. Multifocal acute ischemia within the posterior left MCA distribution and within the left basal ganglia. 2. Small foci of ischemia within the right parietal lobe. 3. Normal intracranial MRA. Specifically, normal flow related enhancement of the left MCA and its branches. Electronically Signed   By: Ulyses Jarred M.D.   On: 03/13/2018 17:23    Mr Brain Wo Contrast   Result Date: 03/13/2018 CLINICAL DATA:  Stroke follow-up EXAM: MRI HEAD WITHOUT CONTRAST MRA  HEAD WITHOUT CONTRAST TECHNIQUE: Multiplanar, multiecho pulse sequences of the brain and surrounding structures were obtained without intravenous contrast. Angiographic images of the head were obtained using MRA technique without contrast. An abbreviated protocol was requested by the referring clinician, including time-of-flight MRA, axial diffusion-weighted imaging and axial susceptibility weighted imaging. COMPARISON:  None. FINDINGS: MRI HEAD FINDINGS BRAIN: There is abnormal diffusion restriction within the posterior left parietal lobe, with mild diffusion restriction in the left basal ganglia. There is a small focus of mild diffusion restriction in the posterior right parietal lobe. No midline shift or other mass effect. Susceptibility weighted imaging shows no hemorrhage.  MRA HEAD FINDINGS POSTERIOR CIRCULATION: --Basilar artery: Normal. --Posterior cerebral arteries: Normal. Both originate from the basilar artery. --Superior cerebellar arteries: Normal. --Inferior cerebellar arteries: Normal anterior and posterior inferior cerebellar arteries. ANTERIOR CIRCULATION: --Intracranial internal carotid arteries: Normal. --Anterior cerebral arteries: Normal. Both A1 segments are present. Patent anterior communicating artery. --Middle cerebral arteries: Normal. --Posterior communicating arteries: Present on the left, absent on the right. IMPRESSION: 1. Multifocal acute ischemia within the posterior left MCA distribution and within the left basal ganglia. 2. Small foci of ischemia within the right parietal lobe. 3. Normal intracranial MRA. Specifically, normal flow related enhancement of the left MCA and its branches. Electronically Signed   By: Ulyses Jarred M.D.   On: 03/13/2018 17:23    [place]   Result Date: 03/14/2018 INDICATION: Acute onset of aphasia, and right-sided weakness. 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 EXAM: 1. EMERGENT LARGE VESSEL  OCCLUSION THROMBOLYSIS (anterior CIRCULATION) COMPARISON:  CT angiogram of the head and neck of 03/12/2018. MEDICATIONS: Ancef 2 g IV antibiotic was administered within 1 hour of the procedure. ANESTHESIA/SEDATION: General anesthesia. CONTRAST:  Isovue 300 approximately 60 cc. FLUOROSCOPY TIME:  Fluoroscopy Time: 52 minutes 40 seconds (1618 mGy). COMPLICATIONS: None immediate. TECHNIQUE: Following a full explanation of the procedure along with the potential associated complications, an informed witnessed consent was obtained from the patient's daughter. The risks of intracranial hemorrhage of 10%, worsening neurological deficit, ventilator dependency, death and inability to revascularize were all reviewed in detail with the patient's daughter. The patient was then put under general anesthesia by the Department of Anesthesiology at [facility]. The right groin was prepped and draped in the usual sterile fashion. Thereafter using modified Seldinger technique, transfemoral access into the right common femoral artery was obtained without difficulty. Over a 0.035 inch guidewire a 5 French Pinnacle sheath was inserted. Through this, and also over a 0.035 inch guidewire a 5 [place] JB 1 catheter was advanced to the aortic arch region and selectively positioned in the left common carotid artery. FINDINGS: The left common carotid arteriogram demonstrates the left external carotid artery at its origin to be mildly narrowed. Its branches are normal in caliber. The left internal carotid artery at the bulb is patent. There is a moderate to severe tortuosity of the mid cervical segment of the left internal carotid artery without evidence of kinking. The petrous, cavernous and supraclinoid segments are widely patent. There is a mild broad-based prominence of the inferior aspect of the proximal cavernous segment. The left anterior cerebral artery opacifies into the capillary and venous phases. There is transient  cross-filling via the anterior communicating artery of the right anterior cerebral artery A2 segment and distally. The left middle cerebral artery demonstrates complete angiographic occlusion of the left A1 segment distal to the origin of the anterior temporal branch. The lateral delayed arterial and capillary phase demonstrates retrograde reconstitution of the perisylvian branches from collaterals arising from the pericallosal and callosal marginal branches. Also demonstrated is early opacification in the mid superior sagittal sinus region on the left from a middle meningeal posterior parietal branch. 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 PROCEDURE: ENDOVASCULAR COMPLETE REVASCULARIZATION OF OCCLUDED LEFT MIDDLE CEREBRAL ARTERY M1 SEGMENT WITH 2 PASSES WITH THE 5 MM X 33 MM EMBOTRAP RETRIEVAL DEVICE ACHIEVING A TICI 3 REVASCULARIZATION. The diagnostic JB 1 catheter in the left common carotid artery was then exchanged over a 0.035 inch 300 cm  Rosen exchange guidewire for a 5 cm 8 [place] Neuron Max guide sheath. Good aspiration obtained from the hub of the Neuron Max sheath. A gentle control arteriogram demonstrated no evidence of intraluminal filling defects or dissections. The Neuron Max sheath was advanced to the hub with the tip protruding into the bulb of the left internal carotid artery. Through this, in a coaxial manner and with constant heparinized saline infusion, a Jet D 135 mm intermediary catheter inside of which was a velocity 150 cm microcatheter combination was advanced over a 0.014 inch Softip Synchro micro guidewire to the distal end of the Neuron Max sheath. Using biplane roadmap technique and constant fluoroscopic guidance, the combination was navigated through the moderate to moderately severe tortuosity of the cervical left ICA into the supraclinoid segment. The micro guidewire was then gently advanced into the occluded left middle cerebral artery dominant inferior division M2  M3 region followed by the microcatheter. Good aspiration was obtained from the hub of the microcatheter. A gentle control arteriogram performed through the hub of the microcatheter demonstrated the tip to be in safe position. This was then connected to continuous heparinized saline infusion. The Jet D catheter could only be advanced to the distal cavernous segment. The Embotrap retrieval device was then deployed in the defined positions from distal to proximal by unsheathing the microcatheter. A control arteriogram performed through the Jet D intermediary catheter in the left internal carotid artery demonstrated a TICI 2b revascularization. Thereafter, with constant aspiration being applied using the Penumbra aspiration device, the combination of the retrieval device the microcatheter and the Jet D intermediary catheter was retrieved and removed as aspiration was continued. Free aspiration of blood was noted at the hub of the Neuron Max sheath. A gentle control arteriogram performed through the sheath demonstrated continued occlusion of the left middle cerebral artery. Following this, a combination of a 95 cm FlowGate balloon guide catheter which had been prepped with 50% contrast and 50% heparinized saline infusion was advanced and positioned in the proximal 1/3 of the left cervical ICA. Free aspiration was noted from the hub of the [facility] guide catheter. A control arteriogram performed through this in the proximal left internal carotid artery demonstrated no change in the extracranial or intracranial circulation. A combination of a 6 French 132 cm Catalyst guide catheter inside of which was an [employer] microcatheter was advanced over a 0.014 inch Softip Synchro micro guidewire to the supraclinoid left ICA. The micro guidewire was then advanced into the dominant M2 M3 region of the inferior division of the left middle cerebral artery. This was then followed by the microcatheter. The guidewire was removed.  After having verified safe position of tip of the microcatheter, the 5 mm x 33 mm Embotrap retrieval device was then deployed in the manner as described above. The 132 cm 6 [place] Catalyst guide catheter was advanced and engaged into the proximal aspect of the clot itself. Thereafter as constant aspiration was applied with a Peumbra aspiration device at the hub of the 6 [place] Catalyst guide catheter, proximal flow arrest was established by inflating the balloon of the [facility] guide catheter in the left internal carotid artery. As constant aspiration was applied at the hub of the of FlowGate guide catheter with a 60 mL syringe, and the Penumbra suction device at the hub of the Catalyst guide catheter the combination of the retrieval device, the microcatheter and the Catalyst guide catheter were retrieved and removed as constant aspiration was continued at the  hub of the [facility] guide catheter with a 60 mL syringe. Following deflation, free aspiration was obtained as well as free back bleed at the hub of the [facility] guide catheter. A control arteriogram performed through the [facility] guide catheter in the proximal left internal carotid artery demonstrated complete revascularization of the previously occluded left middle cerebral artery. Patency of the anterior cerebral artery circulation involving the anterior cerebral artery and the middle cerebral artery was established. Throughout the procedure, the patient's blood pressure and neurological status remained stable. No angiographic evidence of extravasation or of mass-effect was noted. The [facility] guide catheter and the neurovascular sheath were then retrieved into the abdominal aorta and exchanged over a J-tip guidewire for an 8 French Pinnacle sheath. This in turn was removed with the successful application of a 7 [place] ExoSeal closure device with hemostasis in the right groin puncture site. The right groin appeared soft without evidence of a hematoma or  bleeding. Distal pulses remained Dopplerable in the dorsalis pedis, and posterior regions bilaterally. An immediate post treatment CT of the brain revealed no evidence of a hemorrhage, or mass effect or midline shift. There appeared to be contrast stasis in the caudate head of the left basal ganglia, and also the anterior aspect of the putamen. The patient's general anesthesia was then reversed. The patient was extubated. Upon recovery the patient was able to breathe on her own and move all 4 limbs spontaneously and to command. She was then transferred to the neuro ICU to continue post thrombectomy management. IMPRESSION: Status post endovascular complete revascularization of occluded left middle cerebral artery M1 segment with 2 passes of the 5 mm x 33 mm Embotrap retrieval device achieving a TICI 3 revascularization. PLAN: Follow-up in the out patient clinic 4 weeks post discharge. Electronically Signed   By: Luanne Bras M.D.   On: 03/13/2018 15:13    Ir Percutaneous Art Thrombectomy/infusion Intracranial Inc Diag Angio   Result Date: 03/14/2018 INDICATION: Acute onset of aphasia, and right-sided weakness. 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 EXAM: 1. EMERGENT LARGE VESSEL OCCLUSION THROMBOLYSIS (anterior CIRCULATION) COMPARISON:  CT angiogram of the head and neck of 03/12/2018. MEDICATIONS: Ancef 2 g IV antibiotic was administered within 1 hour of the procedure. ANESTHESIA/SEDATION: General anesthesia. CONTRAST:  Isovue 300 approximately 60 cc. FLUOROSCOPY TIME:  Fluoroscopy Time: 52 minutes 40 seconds (1618 mGy). COMPLICATIONS: None immediate. TECHNIQUE: Following a full explanation of the procedure along with the potential associated complications, an informed witnessed consent was obtained from the patient's daughter. The risks of intracranial hemorrhage of 10%, worsening neurological deficit, ventilator dependency, death and inability to  revascularize were all reviewed in detail with the patient's daughter. The patient was then put under general anesthesia by the Department of Anesthesiology at [facility]. The right groin was prepped and draped in the usual sterile fashion. Thereafter using modified Seldinger technique, transfemoral access into the right common femoral artery was obtained without difficulty. Over a 0.035 inch guidewire a 5 French Pinnacle sheath was inserted. Through this, and also over a 0.035 inch guidewire a 5 [place] JB 1 catheter was advanced to the aortic arch region and selectively positioned in the left common carotid artery. FINDINGS: The left common carotid arteriogram demonstrates the left external carotid artery at its origin to be mildly narrowed. Its branches are normal in caliber. The left internal carotid artery at the bulb is patent. There is a moderate to severe tortuosity of the  mid cervical segment of the left internal carotid artery without evidence of kinking. The petrous, cavernous and supraclinoid segments are widely patent. There is a mild broad-based prominence of the inferior aspect of the proximal cavernous segment. The left anterior cerebral artery opacifies into the capillary and venous phases. There is transient cross-filling via the anterior communicating artery of the right anterior cerebral artery A2 segment and distally. The left middle cerebral artery demonstrates complete angiographic occlusion of the left A1 segment distal to the origin of the anterior temporal branch. The lateral delayed arterial and capillary phase demonstrates retrograde reconstitution of the perisylvian branches from collaterals arising from the pericallosal and callosal marginal branches. Also demonstrated is early opacification in the mid superior sagittal sinus region on the left from a middle meningeal posterior parietal branch. 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 PROCEDURE: ENDOVASCULAR  COMPLETE REVASCULARIZATION OF OCCLUDED LEFT MIDDLE CEREBRAL ARTERY M1 SEGMENT WITH 2 PASSES WITH THE 5 MM X 33 MM EMBOTRAP RETRIEVAL DEVICE ACHIEVING A TICI 3 REVASCULARIZATION. The diagnostic JB 1 catheter in the left common carotid artery was then exchanged over a 0.035 inch 300 cm Rosen exchange guidewire for a 5 cm 8 [place] Neuron Max guide sheath. Good aspiration obtained from the hub of the Neuron Max sheath. A gentle control arteriogram demonstrated no evidence of intraluminal filling defects or dissections. The Neuron Max sheath was advanced to the hub with the tip protruding into the bulb of the left internal carotid artery. Through this, in a coaxial manner and with constant heparinized saline infusion, a Jet D 135 mm intermediary catheter inside of which was a velocity 150 cm microcatheter combination was advanced over a 0.014 inch Softip Synchro micro guidewire to the distal end of the Neuron Max sheath. Using biplane roadmap technique and constant fluoroscopic guidance, the combination was navigated through the moderate to moderately severe tortuosity of the cervical left ICA into the supraclinoid segment. The micro guidewire was then gently advanced into the occluded left middle cerebral artery dominant inferior division M2 M3 region followed by the microcatheter. Good aspiration was obtained from the hub of the microcatheter. A gentle control arteriogram performed through the hub of the microcatheter demonstrated the tip to be in safe position. This was then connected to continuous heparinized saline infusion. The Jet D catheter could only be advanced to the distal cavernous segment. The Embotrap retrieval device was then deployed in the defined positions from distal to proximal by unsheathing the microcatheter. A control arteriogram performed through the Jet D intermediary catheter in the left internal carotid artery demonstrated a TICI 2b revascularization. Thereafter, with constant aspiration being  applied using the Penumbra aspiration device, the combination of the retrieval device the microcatheter and the Jet D intermediary catheter was retrieved and removed as aspiration was continued. Free aspiration of blood was noted at the hub of the Neuron Max sheath. A gentle control arteriogram performed through the sheath demonstrated continued occlusion of the left middle cerebral artery. Following this, a combination of a 95 cm FlowGate balloon guide catheter which had been prepped with 50% contrast and 50% heparinized saline infusion was advanced and positioned in the proximal 1/3 of the left cervical ICA. Free aspiration was noted from the hub of the [facility] guide catheter. A control arteriogram performed through this in the proximal left internal carotid artery demonstrated no change in the extracranial or intracranial circulation. A combination of a 6 French 132 cm Catalyst guide catheter inside of which was  an 021 Trevo ProVue microcatheter was advanced over a 0.014 inch Softip Synchro micro guidewire to the supraclinoid left ICA. The micro guidewire was then advanced into the dominant M2 M3 region of the inferior division of the left middle cerebral artery. This was then followed by the microcatheter. The guidewire was removed. After having verified safe position of tip of the microcatheter, the 5 mm x 33 mm Embotrap retrieval device was then deployed in the manner as described above. The 132 cm 6 [place] Catalyst guide catheter was advanced and engaged into the proximal aspect of the clot itself. Thereafter as constant aspiration was applied with a Peumbra aspiration device at the hub of the 6 [place] Catalyst guide catheter, proximal flow arrest was established by inflating the balloon of the [facility] guide catheter in the left internal carotid artery. As constant aspiration was applied at the hub of the of FlowGate guide catheter with a 60 mL syringe, and the Penumbra suction device at the hub of the  Catalyst guide catheter the combination of the retrieval device, the microcatheter and the Catalyst guide catheter were retrieved and removed as constant aspiration was continued at the hub of the [facility] guide catheter with a 60 mL syringe. Following deflation, free aspiration was obtained as well as free back bleed at the hub of the [facility] guide catheter. A control arteriogram performed through the [facility] guide catheter in the proximal left internal carotid artery demonstrated complete revascularization of the previously occluded left middle cerebral artery. Patency of the anterior cerebral artery circulation involving the anterior cerebral artery and the middle cerebral artery was established. Throughout the procedure, the patient's blood pressure and neurological status remained stable. No angiographic evidence of extravasation or of mass-effect was noted. The [facility] guide catheter and the neurovascular sheath were then retrieved into the abdominal aorta and exchanged over a J-tip guidewire for an 8 French Pinnacle sheath. This in turn was removed with the successful application of a 7 [place] ExoSeal closure device with hemostasis in the right groin puncture site. The right groin appeared soft without evidence of a hematoma or bleeding. Distal pulses remained Dopplerable in the dorsalis pedis, and posterior regions bilaterally. An immediate post treatment CT of the brain revealed no evidence of a hemorrhage, or mass effect or midline shift. There appeared to be contrast stasis in the caudate head of the left basal ganglia, and also the anterior aspect of the putamen. The patient's general anesthesia was then reversed. The patient was extubated. Upon recovery the patient was able to breathe on her own and move all 4 limbs spontaneously and to command. She was then transferred to the neuro ICU to continue post thrombectomy management. IMPRESSION: Status post endovascular complete revascularization of  occluded left middle cerebral artery M1 segment with 2 passes of the 5 mm x 33 mm Embotrap retrieval device achieving a TICI 3 revascularization. PLAN: Follow-up in the out patient clinic 4 weeks post discharge. Electronically Signed   By: Luanne Bras M.D.   On: 03/13/2018 15:13    Ct Head Code Stroke Wo Contrast`   Result Date: 03/12/2018 CLINICAL DATA:  Code stroke.  Slurred speech. EXAM: CT HEAD WITHOUT CONTRAST TECHNIQUE: Contiguous axial images were obtained from the base of the skull through the vertex without intravenous contrast. COMPARISON:  Head CT 03/05/2018 and MRI 03/03/2018 FINDINGS: Brain: Evolving subacute infarcts are again seen, small to moderate in size in the left parietal lobe and small in the left occipital lobe without evidence of interval  infarct extension. Small right parietal and cerebellar infarcts on MRI are not well demonstrated. A small chronic left cerebellar infarct is again noted. No new infarct, intracranial hemorrhage, mass, midline shift, or extra-axial fluid collection is identified. The ventricles and sulci are within normal limits for age. There is mild chronic small vessel ischemic disease affecting the cerebral white matter bilaterally. Vascular: Calcified atherosclerosis at the skull base. No hyperdense vessel. Skull: No fracture or focal osseous lesion. Sinuses/Orbits: Visualized paranasal sinuses and mastoid air cells are clear. Bilateral cataract extraction is noted. Other: None. ASPECTS [facility] Stroke Program Early CT Score) Not scored given recent evolving infarcts. IMPRESSION: Evolving subacute infarcts as above without evidence of new intracranial abnormality. These results were communicated to Dr. Erlinda Hong at 2:53 pm on 03/12/2018 by text page via the [facility] messaging system. Electronically Signed   By: Logan Bores M.D.   On: 03/12/2018 14:53             Medical Problem List and Plan: 1.  Confusion/altered mental status with gait disorder and speech  difficulty secondary to left MCA infarction due to left M2 high-grade stenosis status post revascularization embolic pattern likely secondary to hypercoagulable state Readmit to rehab 2.  Antithrombotics: -DVT/anticoagulation:  SCDs.             -antiplatelet therapy: ELIQUIS 3. Pain Management:  Tylenol as needed 4. Mood:  Wellburtein 75 mg twice a day, Celexa 40 mg daily. Provide emotional support             -antipsychotic agents:  None 5. Neuropsych: This patient is capable of making decisions on her own behalf. 6. Skin/Wound Care:  Routine skin checks 7. Fluids/Electrolytes/Nutrition:  Routine ins and outs with follow-up chemistries 8. Poorly differentiated adenocarcinoma the stomach as well as history of multiple myeloma and remote breast cancer. Patient was to follow up outpatient with oncology services Dr. Earlie Server 9. COPD with remote tobacco use. Continue nebulizer treatments as directed 10.  Chronic anemia/thrombocytopenia. Patient follow-up outpatient with hematology oncology. Transfuse as needed 11. Orthostatic hypotension. ProAmatine 10 mg daily 12. CKD stage IV. Creatinine baseline 2.29-2.93. Follow-up chemistries 13. Hypothyroidism. Synthroid 14. GERD. Protonix 15. Hyperlipidemia. Lipitor Post Admission Physician Evaluation: 1. Functional deficits secondary  to R hemiparesis and aphasia due to Left MCA infarct s/p revascularization. 2. Patient admitted to receive collaborative, interdisciplinary care between the physiatrist, rehab nursing staff, and therapy team. 3. Patient's level of medical complexity and substantial therapy needs in context of that medical necessity cannot be provided at a lesser intensity of care. 4. Patient has experienced substantial functional loss from his/her baseline.  5.  Judging by the patient's diagnosis, physical exam, and functional history, the patient has potential for functional progress which will result in measurable gains while on inpatient  rehab.  These gains will be of substantial and practical use upon discharge in facilitating mobility and self-care at the household level. 75. Physiatrist will provide 24 hour management of medical needs as well as oversight of the therapy plan/treatment and provide guidance as appropriate regarding the interaction of the two. 7. 24 hour rehab nursing will assist in the management of  bladder management, bowel management, safety, skin/wound care, disease management, medication administration, pain management and patient education  and help integrate therapy concepts, techniques,education, etc. 8. PT will assess and treat for:pre gait, gait training, endurance , safety, equipment, neuromuscular re education  .  Goals are: independent with assistive device for amb, sup with steps. 9. OT will assess and  treat for ADLs, Cognitive perceptual skills, Neuromuscular re education, safety, endurance, equipment  .  Goals are: supervision.  10. SLP will assess and treat for language, cognition, swallowing  .  Goals are: supervision for med management, safe po intake regular solids and liquids, reduce anomia. 11. Case Management and Social Worker will assess and treat for psychological issues and discharge planning. 12. Team conference will be held weekly to assess progress toward goals and to determine barriers to discharge. 13.  Patient will receive at least 3 hours of therapy per day at least 5 days per week. 14. ELOS and Prognosis: 7-10d excellent  "I have personally performed a face to face diagnostic evaluation of this patient.  Additionally, I have reviewed and concur with the physician assistant's documentation above." Charlett Blake M.D. [facility] FAAPM&R (Sports Med, Neuromuscular Med) Diplomate [place] Board of Electrodiagnostic Med   Cathlyn Parsons, PA-C

## 2018-03-15 NOTE — Progress Notes (Addendum)
S: Patient complaining of acute severe CP. Received sublingual NTG 0.4 mg   O: BP (!) 148/77 (BP Location: Left Arm)   Pulse 72   Temp 98.2 F (36.8 C) (Oral)   Resp 20   SpO2 97%   Exam:  General: Elderly female moaning in pain Lungs: CTA anterior and posterior lung fields Heart: RRR  Muskuloskeletal: Reproducible chest pain to palpation left anterior chest wall in a region approximately 5 cm above nipple line. Also with some degree of tenderness to right anterior chest wall and left posterior-inferior rib cage Skin: Warm and dry, nondiaphoretic  Neuro: Ment: Able to answer questions and follow commands Motor: 4+/5 RUE, 4/5 LUE Lifts lower legs antigravity with poor effort in the context of pain, right greater than left. Of note, she is not complaining of new weakness and exam is with decreased sensitivity/specificity given poor cooperation in the context of pain.   STAT EKG:  Normal sinus rhythm Low voltage QRS Nonspecific T wave abnormality  A/R: 78 year old female with acute chest pain 1. EKG without ST elevation 2. Cycling troponins 3. Has allergies to some opiates, including anaphylaxis to codeine, but low risk listed with rash related to hydrocodone and oxycodone. Discussed with pharmacy and she has received IV morphine in the past without complications. 2 mg IV morphine x 1 has been ordered. 4. GI cocktail with Maalox and lidocaine has been ordered.  5. NTG x 2 was administered without improvement of pain, significantly decreasing the likelihood of a cardiac etiology 6. Discussed by EKG result by telephone with Cardiology fellow on call. Will need troponin level (ordered).  Addendum: Troponin came back elevated at 0.21, stable since yesterday.   Electronically signed: Dr. Kerney Elbe

## 2018-03-15 NOTE — Progress Notes (Signed)
ANTICOAGULATION CONSULT NOTE - Follow Up Consult  Pharmacy Consult for Eliquis Indication: atrial fibrillation       Allergies  Allergen Reactions  . Codeine Anaphylaxis    anaphylasix tolarates oxycodone per care cast  . Latex Shortness Of Breath  . Chocolate     migraines  . Onion Other (See Comments)    Causes migraine headaches  . Zyprexa [Olanzapine] Other (See Comments)    Confusion , dizzy,unsteady  . Adhesive [Tape] Other (See Comments)    blisters  . Heparin Rash    Can flush port with heparin  . Hydrocodone Rash    With extreme itching  . Iodinated Diagnostic Agents Hives, Itching, Rash and Other (See Comments)    Happened 60 years ago Stage IV kidney function  . Oxycodone Rash  . Promethazine-Dm Hives  . Sulfa Antibiotics Itching    Vital Signs: Temp: 98 F (36.7 C) (02/28 1100) Temp Source: Oral (02/28 1100) BP: 131/74 (02/28 1400) Pulse Rate: 64 (02/28 1400)  Labs: RecentLabs(last2labs)        Recent Labs    03/12/18 0820 03/13/18 1000 03/14/18 0202 03/14/18 1048  HGB 9.6* 7.1* 8.5*  --   HCT 30.9* 22.7* 27.0*  --   PLT 58* 85* 78*  --   CREATININE 2.39* 2.25* 2.35*  --   TROPONINI  --   --   --  0.21*      Estimated Creatinine Clearance: 21.3 mL/min (A) (by C-G formula based on SCr of 2.35 mg/dL (H)).   Assessment: 78 year old female on Eliquis for Afib Filter in place for confirmed DVT  Goal of Therapy:  Monitor platelets by anticoagulation protocol: Yes   Plan:  Eliquis 5 mg po BID Watch for bleeding  Yahsir Wickens A. Levada Dy, PharmD, [place] Pager: [phone] Please utilize Amion for appropriate phone number to reach the unit pharmacist ([place])    03/14/2018,3:01 PM

## 2018-03-15 NOTE — Progress Notes (Signed)
PMR Admission Coordinator Pre-Admission Assessment  Patient: Nancy Norris is an 78 y.o., female MRN: [id number] DOB: 1940-05-18 Height:   Weight:    Insurance Information HMO:     PPO:      PCP:      IPA:      80/20: Yes     OTHER:  PRIMARY: Medicare A and B      Policy#: [id number]      Subscriber: Patient CM Name:       Phone#:      Fax#:  Pre-Cert#:       Employer:  Benefits:  Phone #: NA     Name: Verified eligibility via [employer] on 03/14/18 Eff. Date:  Part A effective: 09/16/06; Part B: 09/16/06     Deduct: $1,408.00      Out of Pocket Max: NA      Life Max: NA CIR: covered per Medicare guidelines once yearly deductible is met      SNF: days 1-20, 100%, days 21-100, 80% Outpatient: 80%     Co-Pay: 20% Home Health: 100%      Co-Pay:  DME: 80%     Co-Pay: 20% Providers: [name] choice SECONDARY: BCBS      Policy#: [id number]      Subscriber: Patient CM Name:       Phone#:      Fax#:  Pre-Cert#:       Employer:  Benefits:  Phone #: [phone]     Name:  Eff. Date:      Deduct:       Out of Pocket Max:       Life Max:  CIR:       SNF:  Outpatient:      Co-Pay:  Home Health:       Co-Pay:  DME:      Co-Pay:  Medicaid Application Date:       Case Manager:  Disability Application Date:       Case Worker:   Emergency Contact Information         Contact Information    Name Relation Home Work [place] Daughter [phone]      Current Medical History  Patient Admitting Diagnosis: multifocal L MCA/L BG infarct. Small R parietal lobe infarct  History of Present Illness: Nancy Norris is a 78 year old female with history of multiple myeloma in remission followed by Dr. Earlie Server, remote breast cancer as well as COPD with remote tobacco abuse 13 years ago, medical history also complicated by chronic anemia with thrombocytopenia, fibromyalgia as well as CKD stage IV with creatinine 2.29-2.93 andhypotension maintained on  ProAmatine. Patient initially presented 03/03/2018 with altered mental status difficulty swallowing as well as headache 2 weeks. Lactic acid 1.2, mildly elevated ammonia level. Previously she was seen in the ED on 02/26/2018 for episodes of dizziness as well as headache and fall. CT of the head was negative. She had a hemoglobin of 7.9 with noted history of chronic anemia 8 urine culture showed greater than 100,000 Escherichia coli she was discharged to home after being placed on Keflex. On this latest admission cranial CT scan repeated showing 2 separate areas of abnormal cortical and subcortical edema in the left hemisphere affecting the left parietal lobe occipital lobes that were not present on 02/26/2018. MRI the brain showed left greater than right small to moderate size multifocal infarctions. Per report, acute ischemia within multi vascular territories in both hemispheres. MRA was unremarkable. Patient did not receive  TPA. Her echocardiogram showed ejection fraction of 45% normal systolic function. Carotid Dopplers without ICA stenosis. Blood cultures positive for staph species and Proteus she was placed on antibiotic therapy with vancomycin and ceftriaxone 03/06/2018. A swallow study completed as well as esophagram 03/06/2018 showed nonspecific esophageal dysmotility no appreciable upper gastric diverticulum and an initially on a regular diet. TEE completed 03/07/2018 with ejection fraction of 65% and no thrombus or vegetation. Small PFO seen by color Doppler. She did remain on aspirin for CVA prophylaxis. Gastroenterology services consulted for evaluation of noted abnormal barium swallow question GI bleed with noted history of anemia. An upper GI endoscopy completed 03/08/2020 per Dr. Michail Sermon showed a large fungating malignant appearing ulcerated proximal stomach mass that had areas of blood seen consistent with recent bleeding and and biopsies obtained. Mass likely causing partial obstruction which was  contributing to her dysphagia. Recommendations were for CT of abdomen and pelvis completed showing marked wall thickening in the esophagogastric junction and proximal stomach consistent with known neoplasm. Bulky lymphadenopathy in the gastrohepatic ligament compatible with metastatic disease. There was a 2.7 cm hypoattenuating lesion in the spleen as well as scattered tiny pulmonary nodules measuring up to 5 mm and mild lymphadenopathy noted in the upper mediastinum 1.9 x 1.2 cm irregular lesion posterior left upper lobe at the site of a 4 cm mass that was also noted on PET scan 10/11/2015 features compatible with residual of that lesion. Oncology services Dr. Earlie Server follow-up in regards to findings pathology report remain pending current plan was to await biopsy and make recommendations about management of suspicious malignancy. Hospital course complicated by findings of acute DVT left posterior tibial vein and left peroneal vein radiology services consulted 03/09/2018 underwent IVC filter placement by Dr. Barbie Banner of interventional radiology. On 03/10/2018 cardiology again follow-up for nonspecific chest discomfort dizziness as well as elevated troponin 2.15-2.3. EKG did show new Q waves on her ECG in the inferior leads. Repeat echocardiogram showing ejection fraction 65%. The right ventricle normal systolic function no increase in right ventricular wall thickness. Therapy evaluations completed and patient was admitted to inpatient rehabilitation services 03/11/2018. On the afternoon of 03/12/2018 patient became more confused and garbled speech/expressive aphasia and bilateral upper extremity increased weakness as well as increased tone. Neurology service was consulted stat cranial CT scan obtained that showed evolving subacute infarctions without evidence of new intracranial abnormality. Patient was discharged to acute care services 03/12/2018 for further evaluation. CT angiogram of head and neck showed high-grade  stenosis versus nonocclusive thrombus involving the proximal left superior M2 division. Interventional radiology was consulted and pt underwent complete revascularization of occluded left MCA distal M1 occlusion.Ffter discussing with hematology oncology services in regards to left MCA infarction likely related to hypercoagulable state plan was to begin [facility] in place of aspirin therapy and watch for any bleeding tendencies.Her diet remains a regular consistency. Pathology report of gastric mass returned poorly differentiated adenocarcinoma of the stomach and follow-up oncology services and plan to follow-up as outpatient. Of note, on 03/15/18, pt was complaining on chest pain. Pt received sublingual NTG. A stat EKG revealed normal sinus rhythm, low voltage QRS, and nonspecific T wave abnormality. Troponin levels taken with value same as day prior at 0.21.  Therapy evaluations again have been completed after latest event and recommendations were to return to inpatient rehabilitation services for comprehensive therapies. Pt is to be admitted to CIR on 03/15/2018.    Patient's medical record from [facility] has been reviewed by  the rehabilitation admission coordinator and physician. Current NIH: 3  Past Medical History      Past Medical History:  Diagnosis Date  . Anemia   . Anginal pain ([place])    used NTG x 2 May 31 and 06/15/13   . Anxiety   . Arthritis   . B12 deficiency 12/04/2014  . Breast cancer ([place])   . Complication of anesthesia   . COPD (chronic obstructive pulmonary disease) ([place])   . Cryptococcal pneumonitis ([name]) 11/22/2015  . Depression   . Dizziness   . Dyspnea   . Fibromyalgia   . Fibromyalgia   . GERD (gastroesophageal reflux disease)   . Headache(784.0)   . Heart murmur   . Hemoptysis 10/21/2015  . History of blood transfusion    last one May 12   . Hx of cardiovascular stress test    LexiScan with low level exercise Myoview  (02/2013): No ischemia, EF 72%; normal study  . Hx of echocardiogram    a.  Echocardiogram (12/26/2012): EF 19-50%, grade 1 diastolic dysfunction;   b.  Echocardiogram (02/2013): EF 55-60%, no WMA, trivial effusion  . Hyperkalemia   . Hyponatremia   . Hypotension   . Hypothyroidism   . Mucositis   . Multiple myeloma   . Myocardial infarction [facility])    in past, patient was unaware.   . Neuropathy   . Nodule of left lung 09/13/2015  . Pain in joint, pelvic region and thigh 07/07/2015  . Pneumonia    several  . PONV (postoperative nausea and vomiting) 2008   after mastestomy    Family History   family history includes Arthritis in her mother; Asthma in her mother; Cancer in her sister; Hyperlipidemia in her brother.  Prior Rehab/Hospitalizations Has the patient had major surgery during 100 days prior to admission? Yes              Current Medications  Current Facility-Administered Medications:  .   stroke: mapping our early stages of recovery book, , Does not apply, Once, Rosalin Hawking, MD .  0.9 %  sodium chloride infusion (Manually program via Guardrails IV Fluids), , Intravenous, Once, Rosalin Hawking, MD .  0.9 %  sodium chloride infusion, , Intravenous, Continuous, Rosalin Hawking, MD, Last Rate: 40 mL/hr at 03/14/18 1600 .  acetaminophen (TYLENOL) tablet 650 mg, 650 mg, Oral, Q4H PRN, 650 mg at 03/14/18 0952 **OR** [DISCONTINUED] acetaminophen (TYLENOL) solution 650 mg, 650 mg, Per Tube, Q4H PRN **OR** [DISCONTINUED] acetaminophen (TYLENOL) suppository 650 mg, 650 mg, Rectal, Q4H PRN, Deveshwar, Sanjeev, MD .  albuterol (PROVENTIL) (2.5 MG/3ML) 0.083% nebulizer solution 3 mL, 3 mL, Inhalation, Q6H PRN, Rosalin Hawking, MD .  apixaban (ELIQUIS) tablet 5 mg, 5 mg, Oral, BID, Rosalin Hawking, MD .  atorvastatin (LIPITOR) tablet 20 mg, 20 mg, Oral, q1800, Rosalin Hawking, MD, 20 mg at 03/13/18 1737 .  bisacodyl (DULCOLAX) suppository 10 mg, 10 mg, Rectal, Once, Rosalin Hawking, MD .   buPROPion [facility]) tablet 75 mg, 75 mg, Oral, BID, Rosalin Hawking, MD, 75 mg at 03/14/18 0902 .  Chlorhexidine Gluconate Cloth 2 % PADS 6 each, 6 each, Topical, Daily, Rosalin Hawking, MD .  citalopram (CELEXA) tablet 40 mg, 40 mg, Oral, Daily, Rosalin Hawking, MD, 40 mg at 03/14/18 0901 .  cromolyn (OPTICROM) 4 % ophthalmic solution 1-2 drop, 1-2 drop, Both Eyes, QID PRN, Rosalin Hawking, MD .  folic acid (FOLVITE) tablet 1 mg, 1 mg, Oral, Daily, Rosalin Hawking, MD, 1 mg at 03/14/18 0902 .  ipratropium-albuterol (DUONEB) 0.5-2.5 (3) MG/3ML nebulizer solution 3 mL, 3 mL, Nebulization, Q4H PRN, Rosalin Hawking, MD .  levothyroxine (SYNTHROID, LEVOTHROID) tablet 150 mcg, 150 mcg, Oral, QAC breakfast, Rosalin Hawking, MD, 150 mcg at 03/14/18 [phone] .  midodrine (PROAMATINE) tablet 10 mg, 10 mg, Oral, Daily, Rosalin Hawking, MD, 10 mg at 03/14/18 0901 .  pantoprazole (PROTONIX) EC tablet 40 mg, 40 mg, Oral, Daily, Rosalin Hawking, MD, 40 mg at 03/14/18 0901 .  senna-docusate (Senokot-S) tablet 1 tablet, 1 tablet, Oral, QHS, Xu, Jindong, MD .  sodium chloride flush (NS) 0.9 % injection 10-40 mL, 10-40 mL, Intracatheter, Q12H, Rosalin Hawking, MD, 10 mL at 03/14/18 1103 .  sodium chloride flush (NS) 0.9 % injection 10-40 mL, 10-40 mL, Intracatheter, PRN, Rosalin Hawking, MD, 10 mL at 03/14/18 1103 .  traMADol (ULTRAM) tablet 50 mg, 50 mg, Oral, Q6H PRN, Biby, Sharon L, NP, 50 mg at 03/14/18 1036 .  vitamin B-12 (CYANOCOBALAMIN) tablet 1,000 mcg, 1,000 mcg, Oral, Daily, Rosalin Hawking, MD, 1,000 mcg at 03/14/18 0902  Patients Current Diet:      Diet Order                  Diet regular Room service appropriate? Yes; Fluid consistency: Thin  Diet effective now               Precautions / Restrictions Precautions Precautions: Fall Precaution Comments: Rt field cut Restrictions Weight Bearing Restrictions: No   Has the patient had 2 or more falls or a fall with injury in the past year?Yes  Prior Activity Level Limited  Community (1-2x/wk): did not drive or work PTA; stayed in mostly during cold weather; ordered groceries to her home, had home delivery medications.  Prior Functional Level Do you want Prior Function Level of Independence: Independent Comments: PTA (initial): Rides [employer] for transportation; walks without DME. Does cooking/cleaning. ; cancer center provides rides; enjoys "diamond" painting; enjoys crafts; does her own financial and Best boy from other? Self Care: Did the patient need help bathing, dressing, using the toilet or eating?  Independent  Indoor Mobility: Did the patient need assistance with walking from room to room (with or without device)? Independent  Stairs: Did the patient need assistance with internal or external stairs (with or without device)? Independent  Functional Cognition: Did the patient need help planning regular tasks such as shopping or remembering to take medications? [place] / Equipment Home Equipment: Environmental consultant - 2 wheels, [place] - single point, Civil engineer, contracting, Environmental consultant - 4 wheels  Prior Device Use: Indicate devices/aids used by the patient prior to current illness, exacerbation or injury? None of the above  Prior Functional Level Vocation: Retired Comments: PTA (initial): Rides [employer] for transportation; walks without DME. Does cooking/cleaning. ; cancer center provides rides; enjoys "Engineer, building services; enjoys crafts; does her own Patent attorney   Prior Functional Level Current Functional Level  Bed Mobility Independent Min G  Transfers Independent  Min A x2  Mobility - Walk/Wheelchair Independent Min A x2  Mobility - Ambulation/Gait Independent Min A x2  Upper Body Dressing Independent Mod A  Lower Body Dressing Independent Max A  Grooming Independent Set up, sitting  Eating/Drinking Independent Min A  Economist Min A x2  Bladder Continence Continent, Independent Mod A,  continent  Bowel Management  Continent, Independent Mod A, continent   Stair Administrator NT  Communication Independent Impaired; verbal expression impaired  Memory Independent NT formally; per SLP, TBA.  Cooking/Meal Prep  Independent      Housework  Independent   Money Management  Independent   Driving  Dependent     Special needs/care consideration BiPAP/CPAP: no CPM: no Continuous Drip IV: 0.9% sodium chloride infusion  Dialysis: no        Days: no Life Vest: no Oxygen: no, RA Special Bed: no Trach Size: no Wound Vac (area): no      Location: no Skin: ecchymosis chest, right upper; skin tear buttocks; surgical incision                Bowel mgmt: continent, last BM: 03/11/18 Bladder mgmt: continent Diabetic mgmt: no  Previous Home Environment Living Arrangements: Alone  Lives With: Alone Available Help at Discharge: Family, Available PRN/intermittently Type of Home: Apartment Home Layout: One level Home Access: Ramped entrance Bathroom Shower/Tub: Chiropodist: Standard Bathroom Accessibility: Yes How Accessible: Accessible via walker  Discharge Living Setting Plans for Discharge Living Setting: Patient's home, Alone, Apartment Type of Home at Discharge: Apartment Discharge Home Layout: One level Discharge Home Access: [place] entrance Discharge Bathroom Shower/Tub: [place] unit Discharge Bathroom Toilet: Standard Discharge Bathroom Accessibility: Yes How Accessible: Accessible via walker(if sidesteps) Does the patient have any problems obtaining your medications?: No  Social/Family/Support Systems Patient Roles: Other (Comment)(has neigbor who checks in) Daugther plans to take FMLA Contact Information: Richell Corker (daugther): [phone]; work: [phone]; Sue Lush (Family friend): [phone] Anticipated Caregiver: Sue Lush can check in daily after work (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  Per daughter, she will take FMLA (approx 2 weeks once pt DC home).  Anticipated Caregiver's Contact Information: see above Ability/Limitations of Caregiver: supervision/Min A for light IADLs Caregiver Availability: 24/7 A for approx 2 weeks, then intermittent assist anticipated.  Discharge Plan Discussed with Primary Caregiver: Yes(pt and daugther) Is Caregiver In Agreement with Plan?: Yes Does Caregiver/Family have Issues with Lodging/Transportation while Pt is in Rehab?: No  Goals/Additional Needs Patient/Family Goal for Rehab: PT/OT: Supervision; SLP: Supervision Expected length of stay: 7-10 dyas Cultural Considerations: [facility] Dietary Needs: regular, thin liquids Equipment Needs: TBD Pt/Family Agrees to Admission and willing to participate: Yes Program Orientation Provided & Reviewed with Pt/Caregiver Including Roles  & Responsibilities: Yes(with pt and her daugther)  Barriers to Discharge: Decreased caregiver support, Medical stability, Lack of/limited family support, Pending chemo/radiation  Barriers to Discharge Comments: Per family, pt should have 24/7 A for approx 2 weeks; pt will need to find assist if needed for longer duration.   Patient Condition: I have reviewed medical records from [facility], spoken with pt, RN, MD, and PT. I met with patient at the bedside and discussed CIR program and gathered all information required for inpatient rehabilitation assessment. Patient will benefit from ongoing PT, OT, and SLP, can actively participate in 3 hours of therapy a day 5 days of the week, and can make measurable gains during the admission.  Patient will also benefit from the coordinated team approach during an Inpatient Acute Rehabilitation admission.  The patient will receive intensive therapy as well as Rehabilitation physician, nursing, social worker, and care management interventions.  Due to safety, skin/wound care, disease management, medical administration,  pain management, patient education the patient requires 24 hour a day rehabilitation nursing.  The patient is currently Min A x2 with mobility and Mod/Max A basic ADLs.  Discharge setting and therapy post discharge at home with home health and 24/7 A from daughter is anticipated.  Patient has agreed  to participate in the Acute Inpatient Rehabilitation Program and will admit 03/15/18.  Preadmission Screen Completed By:  Jhonnie Garner, 03/14/2018 4:51 PM ______________________________________________________________________   Discussed status with Dr. Letta Pate on 03/14/18 at 4:00PM and received telephone approval for admission today.  Admission Coordinator:  Jhonnie Garner, time 12:14  /Date 03/15/2018  Assessment/Plan: Diagnosis: Left middle cerebral artery infarct with RIght hemiparesis and aphasia 1. Does the need for close, 24 hr/day  Medical supervision in concert with the patient's rehab needs make it unreasonable for this patient to be served in a less intensive setting? Yes 2. Co-Morbidities requiring supervision/potential complications: Full myeloma, remote breast carcinoma, gastric adenocarcinoma, CKD 4, non-ST elevation MI 3. Due to bladder management, bowel management, safety, skin/wound care, disease management, medication administration, pain management and patient education, does the patient require 24 hr/day rehab nursing? Yes 4. Does the patient require coordinated care of a physician, rehab nurse, PT (1-2 hrs/day, 5 days/week), OT (1-2 hrs/day, 5 days/week) and SLP (.5-1 hrs/day, 5 days/week) to address physical and functional deficits in the context of the above medical diagnosis(es)? Yes Addressing deficits in the following areas: balance, endurance, locomotion, strength, transferring, bowel/bladder control, bathing, dressing, feeding, grooming, toileting, cognition, speech, language, swallowing and psychosocial support 5. Can the patient actively participate in an intensive therapy  program of at least 3 hrs of therapy 5 days a week? Yes 6. The potential for patient to make measurable gains while on inpatient rehab is excellent 7. Anticipated functional outcomes upon discharge from inpatients are: supervision PT, supervision OT, supervision SLP 8. Estimated rehab length of stay to reach the above functional goals is: 7-10d 9. Anticipated D/C setting: Home 10. Anticipated post D/C treatments: [place] therapy 11. Overall Rehab/Functional Prognosis: excellent    RECOMMENDATIONS: This patient's condition is appropriate for continued rehabilitative care in the following setting: CIR Patient has agreed to participate in recommended program. Yes Note that insurance prior authorization may be required for reimbursement for recommended care.  Comment:  Daughter at bedside , aware of plan    Charlett Blake M.D. [facility] FAAPM&R (Sports Med, Neuromuscular Med) Diplomate Am Board of Electrodiagnostic Med  Jhonnie Garner 03/14/2018        Revision History    Date/Time User Provider Type Action  03/15/2018 12:18 PM Kirsteins, Luanna Salk, MD Physician Sign  03/15/2018 8:24 AM Jhonnie Garner, OT Rehab Admission Coordinator [facility] Details Report

## 2018-03-15 NOTE — Progress Notes (Signed)
At about 0405  Pt was in tears complained of crushing chest pain. Administered emergency chest pain protocol, paged provider and called [facility] response

## 2018-03-15 NOTE — Progress Notes (Signed)
Report was called to [facility] nurse and at 1436 patient was transported via wheelchair by SWOT nurse to [facility] inpatient rehab.

## 2018-03-15 NOTE — Progress Notes (Signed)
Pt admitted to room [phone]. Oriented to floor, call bell, and no fall policy. Pt deinies any pain or discomfort at this time. Pt currently resting in bed with call bell within reach.    Nancy Norris W Kass Herberger

## 2018-03-15 NOTE — Progress Notes (Signed)
Physical Therapy Treatment Patient Details Name: Nancy Norris MRN: [id number] DOB: 1940/03/26 Today's Date: 03/15/2018    History of Present Illness Nancy Norris is a 78 y.o. Caucasian female with PMH of remote breast cancer, COPD, CKD stage IV on chemo therapy, MI/CAD, multiple myeloma in remission, recently discharged from [facility] to CIR for bilateral cortical and subcortical infarcts. Around 1405 pt was found not able to speak with significant right sided weakness.  CT no acute change but subacute infarcts. Per chart CTA head and neck showed left M2 high grade stenosis vs. Near occlusive thrombus and she went for emergent mechanical thrombectomy.    PT Comments    Pt presented in bed A&O, and agreeable to therex and gait training. Pt tolerated interventions well, but continues to have difficulty with slow processing times and R visual field deficits. Pt gait drifts left as she avoid her R side during activity unless cued to correction. Pt became light headed ~30 feet into gait activity and required a close behind chair follow to sit in. Vitals at cessation of activity: BP 144/79, spO2 98%, and HR 77 BPM. Pt stated she was not having SOB and that she began to feel better after being seated. Pt discharge plan to CIR is appropriate based on current progression and continued impairments. Plan to progress pt gait training and therex to improve endurance and functional mobility.   Follow Up Recommendations  CIR;Supervision for mobility/OOB     Equipment Recommendations  Other (comment)    Recommendations for Other Services       Precautions / Restrictions Precautions Precautions: Fall Precaution Comments: Rt field cut Restrictions Weight Bearing Restrictions: No    Mobility  Bed Mobility Overal bed mobility: Needs Assistance Bed Mobility: Supine to Sit     Supine to sit: HOB elevated;Min assist     General bed mobility comments: Increased time and Min guard for safety/line  management. No dizziness.   Transfers Overall transfer level: Needs assistance Equipment used: Rolling walker (2 wheeled) Transfers: Sit to/from Stand Sit to Stand: Min assist         General transfer comment: Assist to power to standing with cues for hand placment/technique, increased time. transferred to chair post ambulation.   Ambulation/Gait Ambulation/Gait assistance: Min assist;+2 safety/equipment Gait Distance (Feet): 30 Feet Assistive device: Rolling walker (2 wheeled) Gait Pattern/deviations: Decreased step length - left;Decreased step length - right;Drifts right/left;Step-through pattern Gait velocity: decreased   General Gait Details: Slow, mildly unsteady gait   Stairs             Wheelchair Mobility    Modified Rankin (Stroke Patients Only)       Balance Overall balance assessment: Needs assistance Sitting-balance support: Bilateral upper extremity supported;Feet unsupported Sitting balance-Leahy Scale: Good     Standing balance support: During functional activity;Bilateral upper extremity supported Standing balance-Leahy Scale: Poor Standing balance comment: dependent on RW during standing and ambulation                            Cognition Arousal/Alertness: Awake/alert Behavior During Therapy: WFL for tasks assessed/performed Overall Cognitive Status: Impaired/Different from baseline Area of Impairment: Safety/judgement;Awareness;Problem solving;Attention                 Orientation Level: Disoriented to;Time Current Attention Level: Selective Memory: Decreased short-term memory   Safety/Judgement: Decreased awareness of deficits;Decreased awareness of safety   Problem Solving: Slow processing;Requires verbal cues General Comments: Extremely slow processing.  Difficulty running into things in room on right side with RW; not able to dual task, count during talking or count at rest.       Exercises General Exercises -  Lower Extremity Ankle Circles/Pumps: AROM;15 reps;Supine;Both Quad Sets: 10 reps;Supine;Both;AROM Heel Slides: AROM;10 reps;Both;Supine Hip ABduction/ADduction: AROM;10 reps;Both;Supine Straight Leg Raises: AROM;10 reps;Both;Supine    General Comments        Pertinent Vitals/Pain Pain Assessment: No/denies pain    Home Living                      Prior Function            PT Goals (current goals can now be found in the care plan section) Acute Rehab PT Goals Patient Stated Goal: to get into chair  PT Goal Formulation: With patient Potential to Achieve Goals: Good Additional Goals Additional Goal #1: Pt will be able to safely navigate environment without bumping into obstacles using visual compensatory strategies 3/3 times prior to d/c.    Frequency    Min 4X/week      PT Plan Current plan remains appropriate    Co-evaluation              AM-PAC PT "6 Clicks" Mobility   Outcome Measure  Help needed turning from your back to your side while in a flat bed without using bedrails?: A Little Help needed moving from lying on your back to sitting on the side of a flat bed without using bedrails?: A Little Help needed moving to and from a bed to a chair (including a wheelchair)?: A Little Help needed standing up from a chair using your arms (e.g., wheelchair or bedside chair)?: A Little Help needed to walk in hospital room?: A Lot Help needed climbing 3-5 steps with a railing? : A Lot 6 Click Score: 16    End of Session Equipment Utilized During Treatment: Gait belt Activity Tolerance: Patient tolerated treatment well Patient left: in chair;with call bell/phone within reach;with chair alarm set Nurse Communication: Mobility status PT Visit Diagnosis: Unsteadiness on feet (R26.81);Difficulty in walking, not elsewhere classified (R26.2);Other symptoms and signs involving the nervous system (R29.898)     Time: 9509-3267 PT Time Calculation (min) (ACUTE  ONLY): 30 min  Charges:  $Gait Training: 8-22 mins $Therapeutic Exercise: 8-22 mins                     Maryelizabeth Kaufmann, SPTA   Maryelizabeth Kaufmann 03/15/2018, 1:03 PM

## 2018-03-15 NOTE — Significant Event (Signed)
Rapid Response Event Note  Overview: Cardiac -- Chest Pain  Initial Focused Assessment: Called by RN about having an acute onset of chest pain, I heard the patient moaning in the background. I was with a patient in distress, RN had already paged primary service provider and patient was given NTG as well. When I arrived, Neuro MD came in as well, patient was moaning, stated her chest pain, pointed to the mid-left anterior chest region. Chest pain was reproducible - radiated to the right side and in her upper back as well. Skin was warm and dry, not in acute distress. VS were stable  Interventions: -- EKG - no ST elevation -- STAT troponin -- GI Cocktail x 1 -- Morphine 2 mg IV x 1  Plan of Care: -- I called back around 630 -- symptoms improved and patient was resting now   Event Summary:  Call Time 0419 Arrival Time 0425 End Time 0448  Revonda Menter R

## 2018-03-16 ENCOUNTER — Inpatient Hospital Stay (HOSPITAL_COMMUNITY): Payer: Medicare Other

## 2018-03-16 ENCOUNTER — Inpatient Hospital Stay (HOSPITAL_COMMUNITY): Payer: Medicare Other | Admitting: Occupational Therapy

## 2018-03-16 DIAGNOSIS — I69320 Aphasia following cerebral infarction: Secondary | ICD-10-CM

## 2018-03-16 MED ORDER — LIDOCAINE VISCOUS HCL 2 % MT SOLN
15.0000 mL | Freq: Once | OROMUCOSAL | Status: AC
  Administered 2018-03-16: 15 mL via ORAL
  Filled 2018-03-16: qty 15

## 2018-03-16 MED ORDER — SENNOSIDES-DOCUSATE SODIUM 8.6-50 MG PO TABS
2.0000 | ORAL_TABLET | Freq: Every day | ORAL | Status: DC
  Administered 2018-03-16 – 2018-03-17 (×2): 2 via ORAL
  Administered 2018-03-21: 1 via ORAL
  Administered 2018-03-22 – 2018-03-23 (×2): 2 via ORAL
  Filled 2018-03-16 (×8): qty 2

## 2018-03-16 MED ORDER — ALUM & MAG HYDROXIDE-SIMETH 200-200-20 MG/5ML PO SUSP
30.0000 mL | Freq: Once | ORAL | Status: AC
  Administered 2018-03-16: 30 mL via ORAL
  Filled 2018-03-16: qty 30

## 2018-03-16 NOTE — Plan of Care (Signed)
  Problem: Consults Goal: RH STROKE PATIENT EDUCATION Description See Patient Education module for education specifics  Outcome: Progressing   Problem: RH SKIN INTEGRITY Goal: RH STG SKIN FREE OF INFECTION/BREAKDOWN Description With min assist  Outcome: Progressing Goal: RH STG MAINTAIN SKIN INTEGRITY WITH ASSISTANCE Description STG Maintain Skin Integrity With min Assistance.  Outcome: Progressing   Problem: RH SAFETY Goal: RH STG ADHERE TO SAFETY PRECAUTIONS W/ASSISTANCE/DEVICE Description STG Adhere to Safety Precautions With reminders Assistance/Device.  Outcome: Progressing   Problem: RH KNOWLEDGE DEFICIT Goal: RH STG INCREASE KNOWLEGDE OF HYPERLIPIDEMIA Description Pt will be able to state management of HLD using medications and dietary restrictions using handouts and resources independently  Outcome: Progressing Goal: RH STG INCREASE KNOWLEDGE OF STROKE PROPHYLAXIS Description Pt will be able to state management of secondary stroke risk factors using medications and dietary restrictions using handouts/resources independently  Outcome: Progressing

## 2018-03-16 NOTE — Evaluation (Signed)
Physical Therapy Assessment and Plan  Patient Details  Name: Nancy Norris MRN: [id number] Date of Birth: 1940/01/26  PT Diagnosis: Difficulty walking, Impaired cognition, Impaired sensation, Muscle weakness and Pain in stomach/chest area Rehab Potential: Good ELOS: 6-10 days   Today's Date: 03/16/2018 PT Individual Time: 1100-1140 PT Individual Time Calculation (min): 40 min  and Today's Date: 03/16/2018 PT Missed Time: 20 Minutes Missed Time Reason: Pain;Patient unwilling to participate   Problem List:  Patient Active Problem List   Diagnosis Date Noted  . Left middle cerebral artery stroke ([place]) 03/15/2018  . Hypercoagulable state, secondary ([place]) 03/14/2018  . Middle cerebral artery embolism, left 03/12/2018  . Subcortical infarction ([place]) 03/11/2018  . Pancytopenia ([place])   . NSTEMI (non-ST elevated myocardial infarction) ([place])   . Dyslipidemia   . Hypotension   . Acute DVT (deep venous thrombosis) ([place])   . Malignant neoplasm of stomach ([place])   . Acute cardioembolic stroke ([place])   . CKD (chronic kidney disease), stage III ([place])   . Dysphagia   . CKD (chronic kidney disease), stage IV ([place])   . Stroke ([place]) 03/03/2018  . Port catheter in place 09/18/2016  . Cryptococcal pneumonitis ([place]) 11/22/2015  . Symptomatic anemia 03/31/2015  . B12 deficiency 12/04/2014  . Thrombocytopenia ([place]) 12/04/2014  . Encounter for antineoplastic chemotherapy 06/21/2014  . CKD (chronic kidney disease) stage 5, GFR less than 15 ml/min (HCC) 04/02/2014  . Multiple myeloma ([place]) 12/02/2013  . Major depressive disorder, recurrent episode ([place]) 11/02/2013  . Medication management 09/30/2013  . Vitamin D deficiency 03/24/2013  . Mixed hyperlipidemia 02/24/2013  . COPD (chronic obstructive pulmonary disease) with emphysema ([place]) 02/20/2013  . GERD (gastroesophageal reflux disease) 01/20/2013  . HTN (hypertension) 01/14/2013  . Asthma, chronic 01/13/2013  . Hypothyroid 01/13/2013  . Chronic  diastolic heart failure ([place]) 01/13/2013  . HX: breast cancer 12/08/2012  . Hypercalcemia 12/02/2012    Past Medical History:  Past Medical History:  Diagnosis Date  . Anemia   . Anginal pain ([place])    used NTG x 2 May 31 and 06/15/13   . Anxiety   . Arthritis   . B12 deficiency 12/04/2014  . Breast cancer ([place])   . Complication of anesthesia   . COPD (chronic obstructive pulmonary disease) ([place])   . Cryptococcal pneumonitis ([place]) 11/22/2015  . Depression   . Dizziness   . Dyspnea   . Fibromyalgia   . Fibromyalgia   . GERD (gastroesophageal reflux disease)   . Headache(784.0)   . Heart murmur   . Hemoptysis 10/21/2015  . History of blood transfusion    last one May 12   . Hx of cardiovascular stress test    LexiScan with low level exercise Myoview (02/2013): No ischemia, EF 72%; normal study  . Hx of echocardiogram    a.  Echocardiogram (12/26/2012): EF 78-46%, grade 1 diastolic dysfunction;   b.  Echocardiogram (02/2013): EF 55-60%, no WMA, trivial effusion  . Hyperkalemia   . Hyponatremia   . Hypotension   . Hypothyroidism   . Mucositis   . Multiple myeloma   . Myocardial infarction [facility])    in past, patient was unaware.   . Neuropathy   . Nodule of left lung 09/13/2015  . Pain in joint, pelvic region and thigh 07/07/2015  . Pneumonia    several  . PONV (postoperative nausea and vomiting) 2008   after mastestomy   Past Surgical History:  Past Surgical History:  Procedure Laterality Date  .  ABDOMINAL HYSTERECTOMY  1981  . AV FISTULA PLACEMENT Left 06/19/2013   Procedure: CREATION OF LEFT ARM ARTERIOVENOUS (AV) FISTULA ;  Surgeon: Angelia Mould, MD;  Location: [facility];  Service: Vascular;  Laterality: Left;  . BIOPSY  03/08/2018   Procedure: BIOPSY;  Surgeon: Wilford Corner, MD;  Location: [facility];  Service: Endoscopy;;  . BREAST RECONSTRUCTION    . BREAST SURGERY Right    reduction  . CATARACT EXTRACTION, BILATERAL    . CHOLECYSTECTOMY  1971  .  COLONOSCOPY    . ESOPHAGOGASTRODUODENOSCOPY (EGD) WITH PROPOFOL N/A 03/08/2018   Procedure: ESOPHAGOGASTRODUODENOSCOPY (EGD) WITH PROPOFOL;  Surgeon: Wilford Corner, MD;  Location: [facility];  Service: Endoscopy;  Laterality: N/A;  . EYE SURGERY Bilateral    lens implant  . history of Port removal    . IR CT HEAD LTD  03/12/2018  . IR IVC FILTER PLMT / S&I /IMG GUID/MOD SED  03/09/2018  . IR PERCUTANEOUS ART THROMBECTOMY/INFUSION INTRACRANIAL INC DIAG ANGIO  03/12/2018  . LUNG BIOPSY  10/21/2015  . MASTECTOMY Left 2008  . PORTACATH PLACEMENT  12/2012   has had 2  . RESECTION OF ARTERIOVENOUS FISTULA ANEURYSM Left 06/04/2017   Procedure: LIGATION ANEURYSM OF LEFT ARTERIOVENOUS FISTULA;  Surgeon: Angelia Mould, MD;  Location: [facility];  Service: Vascular;  Laterality: Left;  . Status post stem cell transplant on September 28, 2008.    . TEE WITHOUT CARDIOVERSION N/A 03/07/2018   Procedure: TRANSESOPHAGEAL ECHOCARDIOGRAM (TEE);  Surgeon: Buford Dresser, MD;  Location: [facility] ENDOSCOPY;  Service: Cardiovascular;  Laterality: N/A;    Assessment & Plan Clinical Impression: Patient is a 78 year old right-handed female she does have a history of multiple myeloma in remission followed by Dr. Earlie Server, remote breast cancer as well as COPD with remote tobacco abuse 13 years ago, medical history also complicated by chronic anemia with thrombocytopenia, fibromyalgia as well as CK D stage IV with creatinine 2.29-2.93 andhypotension maintained on ProAmatine. Per chart review she lives alone and was independent prior to admission she has a daughter in the area. One level home with ramped entrance. Patient initially presented 03/03/2018 with altered mental status difficulty swallowing as well as headache 2 weeks. Lactic acid 1.2, mildly elevated ammonia level. She was seen in the ED on 02/26/2018 for episodes of dizziness as well as headache and fall. CT of the head was negative. She had a hemoglobin  of 7.9 with noted history of chronic anemia 8 urine culture showed greater than 100,000 Escherichia coli she was discharged to home after being placed on Keflex. On this latest admission cranial CT scan repeated showing 2 separate areas of abnormal cortical and subcortical edema in the left hemisphere affecting the left parietal lobe occipital lobes that were not present on 02/26/2018. MRI the brain showed left greater than right small to moderate size multifocal infarctions. Per report, acute ischemia within multi vascular territories in both hemispheres. MRA was unremarkable. Patient did not receive TPA. Her echocardiogram showed ejection fraction of 81% normal systolic function. Carotid Dopplers without ICA stenosis. Blood cultures positive for staph species and Proteus she was placed on antibiotic therapy with vancomycin and ceftriaxone 03/06/2018. A swallow study completed as well as esophagram 03/06/2018 showed nonspecific esophageal dysmotility no appreciable upper gastric diverticulum and an initially on a regular diet. TEE completed 03/07/2018 with ejection fraction of 65% and no thrombus or vegetation. Small PFO seen by color Doppler. She did remain on aspirin for CVA prophylaxis. Gastroenterology services consulted for evaluation of  noted abnormal barium swallow question GI bleed with noted history of anemia. An upper GI endoscopy completed 03/08/2020 per Dr. Michail Sermon showed a large fungating malignant appearing ulcerated proximal stomach mass that had areas of blood seen consistent with recent bleeding and and biopsies obtained. Mass likely causing partial obstruction which was contributing to her dysphagia. Recommendations were for CT of abdomen and pelvis completed showing marked wall thickening in the esophagogastric junction and proximal stomach consistent with known neoplasm. Bulky lymphadenopathy in the gastrohepatic ligament compatible with metastatic disease. There was a 2.7 cm hypoattenuating  lesion in the spleen as well as scattered tiny pulmonary nodules measuring up to 5 mm and mild lymphadenopathy noted in the upper mediastinum 1.9 x 1.2 cm irregular lesion posterior left upper lobe at the site of a 4 cm mass that was also noted on PET scan 10/11/2015 features compatible with residual of that lesion. Oncology services Dr. Earlie Server follow-up in regards to findings pathology report remain pending current plan was to await biopsy and make recommendations about management of suspicious malignancy. Hospital course complicated by findings of acute DVT left posterior tibial vein and left peroneal vein radiology services consulted 03/09/2018 underwent IVC filter placement by Dr. Barbie Banner of interventional radiology. On 03/10/2018 cardiology again follow-up for nonspecific chest discomfort dizziness as well as elevated troponin 2.15-2.3. EKG did show new Q waves on her ECG in the inferior leads. Repeat echocardiogram showing ejection fraction 65%. The right ventricle normal systolic function no increase in right ventricular wall thickness. Therapy evaluations completed and patient was admitted to inpatient rehabilitation services 03/11/2018. On the afternoon of 03/12/2018 patient became more confused and garbled speech/expressive aphasia and bilateral upper extremity increased weakness as well as increased tone. Neurology service was consulted stat cranial CT scan obtained that showed evolving subacute infarctions without evidence of new intracranial abnormality. Patient was discharged to acute care services 03/12/2018 for further evaluation. CT angiogram of head and neck showed high-grade stenosis versus nonocclusive thrombus involving the proximal left superior M2 division. Interventional radiology consulted and underwent complete revascularization of occluded left MCA distal M1 occlusion.Ffter discussing with hematology oncology services in regards to left MCA infarction likely related to hypercoagulable state  plan was to begin [facility] in place of aspirin therapy and watch for any bleeding tendencies.Her diet remains a regular consistency. Pathology report of gastric mass returned poorly differentiated adenocarcinoma of the stomach and follow-up oncology services and plan to follow-up as outpatient. Therapy evaluations again have been completed after latest event and recommendations were to return to inpatient rehabilitation services for comprehensive therapies.  Episode of Chest pain this am, relieved by GI cocktail, not NTG.  No new EKG changes, , cycling troponins trending down,  Seen by cardiology who felt this was not a cardiac event. Patient transferred to CIR on 03/15/2018 .   Patient currently requires min to mod with mobility secondary to muscle weakness, decreased cardiorespiratoy endurance, decreased awareness, decreased problem solving and decreased memory and decreased standing balance and decreased balance strategies.  Prior to hospitalization, patient was independent  with mobility and lived with Alone in a [place] home.  Home access is  Ramped entrance.  Patient will benefit from skilled PT intervention to maximize safe functional mobility, minimize fall risk and decrease caregiver burden for planned discharge home with 24 hour supervision.  Anticipate patient will benefit from follow up [place] at discharge.  PT - End of Session Activity Tolerance: Tolerates 10 - 20 min activity with multiple rests Endurance Deficit: Yes Endurance  Deficit Description: fatigues easily and pain limiting PT Assessment Rehab Potential (ACUTE/IP ONLY): Good PT Barriers to Discharge: Decreased caregiver support;Medical stability PT Barriers to Discharge Comments: recommending supervision PT Patient demonstrates impairments in the following area(s): Balance;Endurance;Motor;Pain;Safety;Sensory PT Transfers Functional Problem(s): Bed Mobility;Bed to Chair;Car;Furniture;Floor PT Locomotion Functional Problem(s):  Ambulation;Stairs PT Plan PT Intensity: Minimum of 1-2 x/day ,45 to 90 minutes PT Frequency: 5 out of 7 days PT Duration Estimated Length of Stay: 6-10 days PT Treatment/Interventions: Ambulation/gait training;Balance/vestibular training;Cognitive remediation/compensation;Community reintegration;Discharge planning;DME/adaptive equipment instruction;Disease management/prevention;Functional mobility training;Neuromuscular re-education;Pain management;Patient/family education;Psychosocial support;Skin care/wound management;Stair training;Therapeutic Activities;Therapeutic Exercise;UE/LE Strength taining/ROM;UE/LE Coordination activities;Wheelchair propulsion/positioning PT Transfers Anticipated Outcome(s): supervision overall PT Locomotion Anticipated Outcome(s): supervision gait PT Recommendation Recommendations for Other Services: Neuropsych consult Follow Up Recommendations: Home health PT Patient destination: Home Equipment Recommended: To be determined  Skilled Therapeutic Intervention Evaluation completed (see details above and below) with education on PT POC and goals and individual treatment initiated with focus on functional transfers with and without AD (min assist using RW) including bed <> w/c and w/c <> toilet with cues for technique and encouragement due to pain limiting pt's ability to participate. Pt declined leaving room or engaging in further mobility training/assessment due to pain - RN aware. Pt requested to return to bed after engaging in toileting activities and oral/hand hygiene at the sink. Issued HEP for BLE strengthening exercises in supine and seated positions and gave to her and daughter to work on later today if pt felt up to it. Educated on importance of OOB and continued mobility and encouraged OOB this afternoon with nursing staff if able. Verbalized understanding and in agreement.   PT Evaluation Precautions/Restrictions Precautions Precautions: Fall Precaution  Comments: Portacath  Restrictions Weight Bearing Restrictions: No General PT Amount of Missed Time (min): 20 Minutes PT Missed Treatment Reason: Pain;Patient unwilling to participate  Pain Pain Assessment Pain Scale: 0-10 Pain Score: 10-Worst pain ever Faces Pain Scale: Hurts worst Pain Type: Acute pain Pain Location: Abdomen(epigastric are) Pain Orientation: Upper Pain Descriptors / Indicators: Aching Pain Frequency: Constant Pain Onset: On-going Pain Intervention(s): Medication (See eMAR)(tramadol)  RN made aware again during session that this limited pt's ability to participate Home Living/Prior Functioning Home Living Available Help at Discharge: Family;Available PRN/intermittently Type of Home: Apartment Home Access: Ramped entrance Home Layout: One level Bathroom Shower/Tub: Chiropodist: Standard Bathroom Accessibility: Yes  Lives With: Alone Prior Function Level of Independence: Independent with basic ADLs;Independent with homemaking with ambulation;Independent with transfers  Able to Take Stairs?: Yes Vision/Perception  Perception Perception: Within Functional Limits Praxis Praxis: Intact  Cognition Overall Cognitive Status: Impaired/Different from baseline Memory: Impaired Memory Impairment: Decreased recall of new information;Decreased short term memory;Retrieval deficit Awareness: Impaired Awareness Impairment: Emergent impairment;Anticipatory impairment Sensation Sensation Additional Comments: h/o BLE peripheral neuropathy Coordination Gross Motor Movements are Fluid and Coordinated: Yes Motor  Motor Motor: Other (comment)(general deconditioning) Motor - Skilled Clinical Observations: generalized weakness  Mobility Bed Mobility Bed Mobility: Rolling Left;Supine to Sit;Sit to Supine Rolling Right: Supervision/verbal cueing Rolling Left: Supervision/Verbal cueing Supine to Sit: Contact Guard/Touching assist Sit to Supine: Contact  Guard/Touching assist Transfers Transfers: Sit to Stand;Stand Pivot Transfers Sit to Stand: Moderate Assistance - Patient 50-74% Stand to Sit: Minimal Assistance - Patient > 75% Stand Pivot Transfers: Minimal Assistance - Patient > 75% Stand Pivot Transfer Details: Verbal cues for technique Stand Pivot Transfer Details (indicate cue type and reason): mod assist for initial sit <> stands and pain limiting upright posture; min assist with RW  for UE support Locomotion  Gait Gait Distance (Feet): 2 Feet Assistive device: None Stairs / Additional Locomotion Stairs: No Wheelchair Mobility Wheelchair Mobility: No(pt refused due to pain; total assist) Wheelchair Parts Management: Needs assistance  Trunk/Postural Assessment  Cervical Assessment Cervical Assessment: Within Functional Limits Thoracic Assessment Thoracic Assessment: Within Functional Limits(flexed posture) Lumbar Assessment Lumbar Assessment: Within Functional Limits Postural Control Postural Control: Within Functional Limits  Balance Balance Balance Assessed: Yes Dynamic Sitting Balance Dynamic Sitting - Level of Assistance: 5: Stand by assistance Dynamic Standing Balance Dynamic Standing - Level of Assistance: 4: Min assist;3: Mod assist Extremity Assessment      RLE Assessment RLE Assessment: Within Functional Limits General Strength Comments: grossly appears 4/5; decreased muscular endurance LLE Assessment LLE Assessment: Within Functional Limits General Strength Comments: grossly appears 4/5; decreased muscular endurance    Refer to Care Plan for Long Term Goals  Recommendations for other services: Neuropsych  Discharge Criteria: Patient will be discharged from PT if patient refuses treatment 3 consecutive times without medical reason, if treatment goals not met, if there is a change in medical status, if patient makes no progress towards goals or if patient is discharged from hospital.  The above  assessment, treatment plan, treatment alternatives and goals were discussed and mutually agreed upon: by patient  Juanna Cao, PT, DPT, CBIS  03/16/2018, 12:37 PM

## 2018-03-16 NOTE — Evaluation (Signed)
Occupational Therapy Assessment and Plan  Patient Details  Name: Nancy Norris MRN: [id number] Date of Birth: [birth year]  OT Diagnosis: cognitive deficits, muscle weakness (generalized) and swelling of limb Rehab Potential: Rehab Potential (ACUTE ONLY): Good ELOS: 7-10 days   Today's Date: 03/16/2018 OT Individual Time: 8469-6295 OT Individual Time Calculation (min): 70 min     Problem List:  Patient Active Problem List   Diagnosis Date Noted  . Left middle cerebral artery stroke ([place]) 03/15/2018  . Hypercoagulable state, secondary ([place]) 03/14/2018  . Middle cerebral artery embolism, left 03/12/2018  . Subcortical infarction ([place]) 03/11/2018  . Pancytopenia ([place])   . NSTEMI (non-ST elevated myocardial infarction) ([place])   . Dyslipidemia   . Hypotension   . Acute DVT (deep venous thrombosis) ([place])   . Malignant neoplasm of stomach ([place])   . Acute cardioembolic stroke ([place])   . CKD (chronic kidney disease), stage III ([place])   . Dysphagia   . CKD (chronic kidney disease), stage IV ([place])   . Stroke ([place]) 03/03/2018  . Port catheter in place 09/18/2016  . Cryptococcal pneumonitis ([place]) 11/22/2015  . Symptomatic anemia 03/31/2015  . B12 deficiency 12/04/2014  . Thrombocytopenia ([place]) 12/04/2014  . Encounter for antineoplastic chemotherapy 06/21/2014  . CKD (chronic kidney disease) stage 5, GFR less than 15 ml/min (HCC) 04/02/2014  . Multiple myeloma ([place]) 12/02/2013  . Major depressive disorder, recurrent episode ([place]) 11/02/2013  . Medication management 09/30/2013  . Vitamin D deficiency 03/24/2013  . Mixed hyperlipidemia 02/24/2013  . COPD (chronic obstructive pulmonary disease) with emphysema ([place]) 02/20/2013  . GERD (gastroesophageal reflux disease) 01/20/2013  . HTN (hypertension) 01/14/2013  . Asthma, chronic 01/13/2013  . Hypothyroid 01/13/2013  . Chronic diastolic heart failure ([place]) 01/13/2013  . HX: breast cancer 12/08/2012  . Hypercalcemia 12/02/2012    Past  Medical History:  Past Medical History:  Diagnosis Date  . Anemia   . Anginal pain ([place])    used NTG x 2 May 31 and 06/15/13   . Anxiety   . Arthritis   . B12 deficiency 12/04/2014  . Breast cancer ([place])   . Complication of anesthesia   . COPD (chronic obstructive pulmonary disease) ([place])   . Cryptococcal pneumonitis ([place]) 11/22/2015  . Depression   . Dizziness   . Dyspnea   . Fibromyalgia   . Fibromyalgia   . GERD (gastroesophageal reflux disease)   . Headache(784.0)   . Heart murmur   . Hemoptysis 10/21/2015  . History of blood transfusion    last one May 12   . Hx of cardiovascular stress test    LexiScan with low level exercise Myoview (02/2013): No ischemia, EF 72%; normal study  . Hx of echocardiogram    a.  Echocardiogram (12/26/2012): EF 28-41%, grade 1 diastolic dysfunction;   b.  Echocardiogram (02/2013): EF 55-60%, no WMA, trivial effusion  . Hyperkalemia   . Hyponatremia   . Hypotension   . Hypothyroidism   . Mucositis   . Multiple myeloma   . Myocardial infarction [facility])    in past, patient was unaware.   . Neuropathy   . Nodule of left lung 09/13/2015  . Pain in joint, pelvic region and thigh 07/07/2015  . Pneumonia    several  . PONV (postoperative nausea and vomiting) 2008   after mastestomy   Past Surgical History:  Past Surgical History:  Procedure Laterality Date  . ABDOMINAL HYSTERECTOMY  1981  . AV FISTULA PLACEMENT Left 06/19/2013   Procedure: CREATION  OF LEFT ARM ARTERIOVENOUS (AV) FISTULA ;  Surgeon: Angelia Mould, MD;  Location: [facility];  Service: Vascular;  Laterality: Left;  . BIOPSY  03/08/2018   Procedure: BIOPSY;  Surgeon: Wilford Corner, MD;  Location: [facility];  Service: Endoscopy;;  . BREAST RECONSTRUCTION    . BREAST SURGERY Right    reduction  . CATARACT EXTRACTION, BILATERAL    . CHOLECYSTECTOMY  1971  . COLONOSCOPY    . ESOPHAGOGASTRODUODENOSCOPY (EGD) WITH PROPOFOL N/A 03/08/2018   Procedure: ESOPHAGOGASTRODUODENOSCOPY  (EGD) WITH PROPOFOL;  Surgeon: Wilford Corner, MD;  Location: [facility];  Service: Endoscopy;  Laterality: N/A;  . EYE SURGERY Bilateral    lens implant  . history of Port removal    . IR CT HEAD LTD  03/12/2018  . IR IVC FILTER PLMT / S&I /IMG GUID/MOD SED  03/09/2018  . IR PERCUTANEOUS ART THROMBECTOMY/INFUSION INTRACRANIAL INC DIAG ANGIO  03/12/2018  . LUNG BIOPSY  10/21/2015  . MASTECTOMY Left 2008  . PORTACATH PLACEMENT  12/2012   has had 2  . RESECTION OF ARTERIOVENOUS FISTULA ANEURYSM Left 06/04/2017   Procedure: LIGATION ANEURYSM OF LEFT ARTERIOVENOUS FISTULA;  Surgeon: Angelia Mould, MD;  Location: [facility];  Service: Vascular;  Laterality: Left;  . Status post stem cell transplant on September 28, 2008.    . TEE WITHOUT CARDIOVERSION N/A 03/07/2018   Procedure: TRANSESOPHAGEAL ECHOCARDIOGRAM (TEE);  Surgeon: Buford Dresser, MD;  Location: [facility] ENDOSCOPY;  Service: Cardiovascular;  Laterality: N/A;    Assessment & Plan Clinical Impression: Nancy Norris is a 78 year old right-handed female she does have a history of multiple myeloma in remission followed by Dr. Earlie Server, remote breast cancer as well as COPD with remote tobacco abuse 13 years ago, medical history also complicated by chronic anemia with thrombocytopenia, fibromyalgia as well as CK D stage IV with creatinine 2.29-2.93 andhypotension maintained on ProAmatine. Per chart review she lives alone and was independent prior to admission she has a daughter in the area. One level home with ramped entrance. Patient initially presented 03/03/2018 with altered mental status difficulty swallowing as well as headache 2 weeks. Lactic acid 1.2, mildly elevated ammonia level. She was seen in the ED on 02/26/2018 for episodes of dizziness as well as headache and fall. CT of the head was negative. She had a hemoglobin of 7.9 with noted history of chronic anemia 8 urine culture showed greater than 100,000 Escherichia coli she  was discharged to home after being placed on Keflex. On this latest admission cranial CT scan repeated showing 2 separate areas of abnormal cortical and subcortical edema in the left hemisphere affecting the left parietal lobe occipital lobes that were not present on 02/26/2018. MRI the brain showed left greater than right small to moderate size multifocal infarctions. Per report, acute ischemia within multi vascular territories in both hemispheres. MRA was unremarkable. Patient did not receive TPA. Her echocardiogram showed ejection fraction of 84% normal systolic function. Carotid Dopplers without ICA stenosis. Blood cultures positive for staph species and Proteus she was placed on antibiotic therapy with vancomycin and ceftriaxone 03/06/2018. A swallow study completed as well as esophagram 03/06/2018 showed nonspecific esophageal dysmotility no appreciable upper gastric diverticulum and an initially on a regular diet. TEE completed 03/07/2018 with ejection fraction of 65% and no thrombus or vegetation. Small PFO seen by color Doppler. She did remain on aspirin for CVA prophylaxis. Gastroenterology services consulted for evaluation of noted abnormal barium swallow question GI bleed with noted history of anemia. An  upper GI endoscopy completed 03/08/2020 per Dr. Michail Sermon showed a large fungating malignant appearing ulcerated proximal stomach mass that had areas of blood seen consistent with recent bleeding and and biopsies obtained. Mass likely causing partial obstruction which was contributing to her dysphagia. Recommendations were for CT of abdomen and pelvis completed showing marked wall thickening in the esophagogastric junction and proximal stomach consistent with known neoplasm. Bulky lymphadenopathy in the gastrohepatic ligament compatible with metastatic disease. There was a 2.7 cm hypoattenuating lesion in the spleen as well as scattered tiny pulmonary nodules measuring up to 5 mm and mild lymphadenopathy  noted in the upper mediastinum 1.9 x 1.2 cm irregular lesion posterior left upper lobe at the site of a 4 cm mass that was also noted on PET scan 10/11/2015 features compatible with residual of that lesion. Oncology services Dr. Earlie Server follow-up in regards to findings pathology report remain pending current plan was to await biopsy and make recommendations about management of suspicious malignancy. Hospital course complicated by findings of acute DVT left posterior tibial vein and left peroneal vein radiology services consulted 03/09/2018 underwent IVC filter placement by Dr. Barbie Banner of interventional radiology. On 03/10/2018 cardiology again follow-up for nonspecific chest discomfort dizziness as well as elevated troponin 2.15-2.3. EKG did show new Q waves on her ECG in the inferior leads. Repeat echocardiogram showing ejection fraction 65%. The right ventricle normal systolic function no increase in right ventricular wall thickness. Therapy evaluations completed and patient was admitted to inpatient rehabilitation services 03/11/2018. On the afternoon of 03/12/2018 patient became more confused and garbled speech/expressive aphasia and bilateral upper extremity increased weakness as well as increased tone. Neurology service was consulted stat cranial CT scan obtained that showed evolving subacute infarctions without evidence of new intracranial abnormality. Patient was discharged to acute care services 03/12/2018 for further evaluation. CT angiogram of head and neck showed high-grade stenosis versus nonocclusive thrombus involving the proximal left superior M2 division. Interventional radiology consulted and underwent complete revascularization of occluded left MCA distal M1 occlusion.Ffter discussing with hematology oncology services in regards to left MCA infarction likely related to hypercoagulable state plan was to begin [facility] in place of aspirin therapy and watch for any bleeding tendencies.Her diet remains  a regular consistency. Pathology report of gastric mass returned poorly differentiated adenocarcinoma of the stomach and follow-up oncology services and plan to follow-up as outpatient. Therapy evaluations again have been completed after latest event and recommendations were to return to inpatient rehabilitation services for comprehensive therapies.  Patient currently requires min with basic self-care skills secondary to muscle weakness, decreased cardiorespiratoy endurance, decreased problem solving, decreased safety awareness and decreased memory and decreased standing balance and decreased balance strategies.  Prior to hospitalization, patient could complete BADLs with independent .  Patient will benefit from skilled intervention to increase independence with basic self-care skills prior to discharge home with family assist.  Anticipate patient will require 24 hour supervision and follow up home health.  OT - End of Session Endurance Deficit: Yes Endurance Deficit Description: fatigues easily, c/o persistent dizziness  OT Assessment Rehab Potential (ACUTE ONLY): Good OT Barriers to Discharge: Decreased caregiver support;Pending chemo/radiation(f/u oncology services) OT Patient demonstrates impairments in the following area(s): Balance;Cognition;Edema;Endurance;Sensory;Motor;Safety(pitting edema B feet) OT Basic ADL's Functional Problem(s): Grooming;Bathing;Dressing;Toileting OT Advanced ADL's Functional Problem(s): Simple Meal Preparation OT Transfers Functional Problem(s): Toilet;Tub/Shower OT Plan OT Intensity: Minimum of 1-2 x/day, 45 to 90 minutes OT Frequency: 5 out of 7 days OT Duration/Estimated Length of Stay: 7-10 days OT Treatment/Interventions: Medical illustrator  training;Cognitive remediation/compensation;Community reintegration;Discharge planning;Disease mangement/prevention;DME/adaptive equipment instruction;Functional mobility training;Neuromuscular re-education;Pain  management;Patient/family education;Psychosocial support;Self Care/advanced ADL retraining;Skin care/wound managment;Splinting/orthotics;Therapeutic Activities;Therapeutic Exercise;UE/LE Strength taining/ROM;UE/LE Coordination activities;Visual/perceptual remediation/compensation OT Basic Self-Care Anticipated Outcome(s): Supervision/cuing  OT Toileting Anticipated Outcome(s): Supervision/cuing  OT Bathroom Transfers Anticipated Outcome(s): Supervision/cuing  OT Recommendation Recommendations for Other Services: Therapeutic Recreation consult Therapeutic Recreation Interventions: Stress management;Kitchen group Patient destination: Home Follow Up Recommendations: Home health OT Equipment Recommended: To be determined   Skilled Therapeutic Intervention Skilled OT session completed with focus on initial evaluation, education OT role/POC, and establishment of patient-centered goals.   Pt greeted in bed, reported "feeling funny" but denied pain, tachycardia, or dizziness. C/o SOB. BP assessed with reading of 150/87, 02 sats 100% on RA. BP monitored during session and recorded below. Pt c/o persistent dizziness when at rest and during activity. Dizziness did not get worse during/post stands per report, nor did it get better. RN made aware. She completed bathing/dressing (EOB sit<stand without device) and BSC transfer (Min A stand pivot) during session. Min A sit<stand and and for standing balance during LB dressing and perihygiene. She required Min A for Teds, but able to don footwear using figure 4 position bilaterally. UE strength and function appeared similar bilaterally, though pt does present with mild tremors in Rt/Lt UEs. She reports this is new since admission. Pt also required multiple seated rest breaks due to fatigue and reported feeling "exhausted" after tx, requesting to lie back down. Pt completed bed transfers with supervision without bedrails and HOB flat. She was left with all needs within  reach at session exit.   Pt also exhibited word finding difficulties when discussing PLOF, requiring increased time to think about/respond to all questions asked.   BP supine: 150/82 BP EOB: 148/82 BP post 1st stand: 162/91 BP post 2nd stand 149/87  OT Evaluation Precautions/Restrictions  Precautions Precautions: Fall Precaution Comments: Portacath  Restrictions Weight Bearing Restrictions: No General Chart Reviewed: Yes PT Missed Treatment Reason: Pain;Patient unwilling to participate Family/Caregiver Present: No Pain Pain Assessment Pain Scale: 0-10 Pain Score: 10-Worst pain ever Faces Pain Scale: Hurts worst Pain Type: Acute pain Pain Location: Abdomen(epigastric are) Pain Orientation: Upper Pain Descriptors / Indicators: Aching Pain Frequency: Constant Pain Onset: On-going Pain Intervention(s): Medication (See eMAR)(tramadol) Home Living/Prior Functioning Home Living Available Help at Discharge: Family, Available PRN/intermittently Type of Home: Apartment Home Access: Ramped entrance Home Layout: One level Bathroom Shower/Tub: Chiropodist: Standard Bathroom Accessibility: Yes  Lives With: Alone IADL History Homemaking Responsibilities: Yes Meal Prep Responsibility: Primary Laundry Responsibility: Primary Cleaning Responsibility: Primary Bill Paying/Finance Responsibility: Primary Shopping Responsibility: Primary Homemaking Comments: Taking [employer] bus for errands, including grocery shopping using cart  Current License: No Leisure and Hobbies: Watching TV, spending time with her Engineer, agricultural Prior Function Level of Independence: Independent with basic ADLs, Independent with homemaking with ambulation, Independent with transfers  Able to Take Stairs?: Yes ADL ADL Grooming: Setup Where Assessed-Grooming: Edge of bed Upper Body Bathing: Supervision/safety Where Assessed-Upper Body Bathing: Edge of bed Lower Body Bathing: Minimal  assistance Where Assessed-Lower Body Bathing: Edge of bed Upper Body Dressing: Supervision/safety Where Assessed-Upper Body Dressing: Edge of bed Lower Body Dressing: Minimal assistance Where Assessed-Lower Body Dressing: Edge of bed Toileting: Not assessed Toilet Transfer: Minimal assistance Toilet Transfer Method: Stand Ecologist: Radiographer, therapeutic: Not assessed Vision Baseline Vision/History: Wears glasses Wears Glasses: At all times Patient Visual Report: No change from baseline Perception  Perception: Within Functional Limits Praxis Praxis: Intact Cognition  Overall Cognitive Status: Impaired/Different from baseline Arousal/Alertness: Awake/alert Orientation Level: Person;Place;Situation Person: Oriented Place: Oriented Situation: Oriented Year: Other (Comment)(2022) Month: March Day of Week: Correct(given option of 2 as pt exhibits trouble with word-finding) Memory: Impaired Memory Impairment: Decreased recall of new information;Decreased short term memory;Retrieval deficit Immediate Memory Recall: Sock;Blue;Bed Memory Recall: Sock;Blue;Bed Memory Recall Sock: With Cue Memory Recall Blue: Without Cue Memory Recall Bed: With Cue Attention: Sustained Sustained Attention: Appears intact Awareness: Impaired Awareness Impairment: Emergent impairment;Anticipatory impairment Problem Solving: Impaired Sensation Sensation Additional Comments: h/o BLE peripheral neuropathy Coordination Gross Motor Movements are Fluid and Coordinated: Yes Fine Motor Movements are Fluid and Coordinated: No(mildly tremulous B UEs) Finger Nose Finger Test: WNL Lt and Rt Motor  Motor Motor: Other (comment) Motor - Skilled Clinical Observations: generalized weakness Mobility  Bed Mobility Bed Mobility: Rolling Left;Supine to Sit;Sit to Supine Rolling Right: Supervision/verbal cueing Rolling Left: Supervision/Verbal cueing Supine to Sit: Contact  Guard/Touching assist Sit to Supine: Contact Guard/Touching assist Transfers Sit to Stand: Moderate Assistance - Patient 50-74% Stand to Sit: Minimal Assistance - Patient > 75%  Trunk/Postural Assessment  Cervical Assessment Cervical Assessment: Within Functional Limits Thoracic Assessment Thoracic Assessment: Within Functional Limits Lumbar Assessment Lumbar Assessment: Within Functional Limits Postural Control Postural Control: Within Functional Limits  Balance Balance Balance Assessed: Yes Dynamic Sitting Balance Dynamic Sitting - Level of Assistance: 5: Stand by assistance(donning socks EOB) Dynamic Standing Balance Dynamic Standing - Level of Assistance: 4: Min assist(Elevating pants over hips without device) Extremity/Trunk Assessment RUE Assessment RUE Assessment: Within Functional Limits(Mildly tremulous ) Active Range of Motion (AROM) Comments: Shoulder flexion ~150* and internal rotation WNL for reaching back to pull pants up past hips  General Strength Comments: strength grossly 4/5 LUE Assessment LUE Assessment: Within Functional Limits Active Range of Motion (AROM) Comments: Shoulder flexion 150* and internal rotation WNL for reaching back to pull up pants General Strength Comments: 4/5 grossly      Refer to Care Plan for Long Term Goals  Recommendations for other services: Therapeutic Recreation  Kitchen group and Stress management   Discharge Criteria: Patient will be discharged from OT if patient refuses treatment 3 consecutive times without medical reason, if treatment goals not met, if there is a change in medical status, if patient makes no progress towards goals or if patient is discharged from hospital.  The above assessment, treatment plan, treatment alternatives and goals were discussed and mutually agreed upon: by patient  Skeet Simmer 03/16/2018, 12:47 PM

## 2018-03-16 NOTE — Progress Notes (Signed)
[facility] PHYSICAL MEDICINE & REHABILITATION PROGRESS NOTE   Subjective/Complaints:  Aphasic but denies any CP or abd pain  ROS- + constipation , no bladder issues, +DOE during ADLs  Objective:   No results found. Recent Labs    03/14/18 0202 03/15/18 0448  WBC 6.9 4.9  HGB 8.5* 8.2*  HCT 27.0* 25.7*  PLT 78* 67*   Recent Labs    03/14/18 0202 03/15/18 0448  NA 140 141  K 3.8 3.5  CL 108 111  CO2 22 19*  GLUCOSE 106* 85  BUN 18 17  CREATININE 2.35* 2.29*  CALCIUM 8.7* 8.6*    Intake/Output Summary (Last 24 hours) at 03/16/2018 8099 Last data filed at 03/15/2018 1830 Gross per 24 hour  Intake 0 ml  Output -  Net 0 ml     Physical Exam: Vital Signs Blood pressure 102/68, pulse 94, temperature 98.9 F (37.2 C), temperature source Oral, resp. rate 16, height _0  (1.651 m), weight 79.2 kg, SpO2 97 %.   General: No acute distress Mood and affect are appropriate Heart: Regular rate and rhythm no rubs murmurs or extra sounds Lungs: Clear to auscultation, breathing unlabored, no rales or wheezes Abdomen: Positive bowel sounds, soft nontender to palpation, nondistended Extremities: No clubbing, cyanosis, or edema Skin: No evidence of breakdown, no evidence of rash Neurologic: Cranial nerves II through XII intact, motor strength is 5/5 in bilateral deltoid, bicep, tricep, grip, hip flexor, knee extensors, ankle dorsiflexor and plantar flexor Sensory exam normal sensation to light touch and proprioception in bilateral upper and lower extremities Cerebellar exam normal finger to nose to finger as well as heel to shin in bilateral upper and lower extremities Musculoskeletal: Full range of motion in all 4 extremities. No joint swelling   Assessment/Plan: 1. Functional deficits secondary to Left MCA infarct which require 3+ hours per day of interdisciplinary therapy in a comprehensive inpatient rehab setting.  Physiatrist is providing close team supervision and 24  hour management of active medical problems listed below.  Physiatrist and rehab team continue to assess barriers to discharge/monitor patient progress toward functional and medical goals  Care Tool:  Bathing              Bathing assist       Upper Body Dressing/Undressing Upper body dressing   What is the patient wearing?: Hospital gown only    Upper body assist Assist Level: Minimal Assistance - Patient > 75%    Lower Body Dressing/Undressing Lower body dressing      What is the patient wearing?: Hospital gown only     Lower body assist Assist for lower body dressing: Minimal Assistance - Patient > 75%     Toileting Toileting    Toileting assist Assist for toileting: Supervision/Verbal cueing     Transfers Chair/bed transfer  Transfers assist     Chair/bed transfer assist level: Minimal Assistance - Patient > 75%     Locomotion Ambulation   Ambulation assist      Assist level: Supervision/Verbal cueing Assistive device: Walker-rolling Max distance: bed to bathroom   Walk 10 feet activity   Assist           Walk 50 feet activity   Assist           Walk 150 feet activity   Assist           Walk 10 feet on uneven surface  activity   Assist  Wheelchair     Assist               Wheelchair 50 feet with 2 turns activity    Assist            Wheelchair 150 feet activity     Assist          Medical Problem List and Plan: 1.Confusion/altered mental status with gait disorder and speech difficultysecondary to left MCA infarction due to left M2 high-grade stenosis status post revascularizationembolic pattern likely secondary to hypercoagulable state PT, OT, SLP re evals 2. Antithrombotics: -DVT/anticoagulation:SCDs.+ DOAC -antiplatelet therapy: ELIQUIS 3. Pain Management:Tylenol as needed Epigastric pain yesterday no recurrent episodes, relieved with GI coctail  and not NTG appreciate cardiology eval   4. Mood:Wellbutrin 75 mg twice a day, Celexa 40 mg daily. Provide emotional support -antipsychotic agents: None 5. Neuropsych: This patientiscapable of making decisions on herown behalf. 6. Skin/Wound Care:Routine skin checks 7. Fluids/Electrolytes/Nutrition:Routine ins and outs with follow-up chemistries 8. Poorly differentiated adenocarcinoma the stomach as well as history of multiple myeloma and remote breast cancer. Patient was to follow up outpatient with oncology services Dr. Earlie Server 9. COPD with remote tobacco use. Continue nebulizer treatments as directed 10. Chronic anemia/thrombocytopenia. Patient follow-up outpatient with hematology oncology. Transfuse as needed 11. Orthostatic hypotension. ProAmatine 10 mg daily 12. CKD stage IV. Creatinine baseline 2.29-2.93. Follow-up chemistries 13. Hypothyroidism. Synthroid 14. GERD. Protonix 15. Hyperlipidemia. Lipitor  16.  Constipation- add senna  LOS: 1 days A FACE TO FACE EVALUATION WAS PERFORMED  Charlett Blake 03/16/2018, 6:19 AM

## 2018-03-17 ENCOUNTER — Inpatient Hospital Stay (HOSPITAL_COMMUNITY): Payer: Medicare Other | Admitting: Speech Pathology

## 2018-03-17 ENCOUNTER — Inpatient Hospital Stay (HOSPITAL_COMMUNITY): Payer: Medicare Other | Admitting: Occupational Therapy

## 2018-03-17 ENCOUNTER — Inpatient Hospital Stay (HOSPITAL_COMMUNITY): Payer: Medicare Other | Admitting: Physical Therapy

## 2018-03-17 LAB — COMPREHENSIVE METABOLIC PANEL
ALT: 7 U/L (ref 0–44)
AST: 19 U/L (ref 15–41)
Albumin: 3.2 g/dL — ABNORMAL LOW (ref 3.5–5.0)
Alkaline Phosphatase: 70 U/L (ref 38–126)
Anion gap: 11 (ref 5–15)
BUN: 12 mg/dL (ref 8–23)
CO2: 22 mmol/L (ref 22–32)
Calcium: 8.2 mg/dL — ABNORMAL LOW (ref 8.9–10.3)
Chloride: 106 mmol/L (ref 98–111)
Creatinine, Ser: 2.16 mg/dL — ABNORMAL HIGH (ref 0.44–1.00)
GFR calc Af Amer: 25 mL/min — ABNORMAL LOW (ref >60)
GFR calc non Af Amer: 22 mL/min — ABNORMAL LOW (ref >60)
Glucose, Bld: 109 mg/dL — ABNORMAL HIGH (ref 70–99)
Potassium: 3.2 mmol/L — ABNORMAL LOW (ref 3.5–5.1)
Sodium: 139 mmol/L (ref 135–145)
Total Bilirubin: 1.2 mg/dL (ref 0.3–1.2)
Total Protein: 4.8 g/dL — ABNORMAL LOW (ref 6.5–8.1)

## 2018-03-17 LAB — CBC WITH DIFFERENTIAL/PLATELET
Abs Immature Granulocytes: 0.03 10*3/uL (ref 0.00–0.07)
Basophils Absolute: 0 10*3/uL (ref 0.0–0.1)
Basophils Relative: 0 %
Eosinophils Absolute: 0.1 10*3/uL (ref 0.0–0.5)
Eosinophils Relative: 2 %
HCT: 27.3 % — ABNORMAL LOW (ref 36.0–46.0)
Hemoglobin: 8.9 g/dL — ABNORMAL LOW (ref 12.0–15.0)
Immature Granulocytes: 1 %
Lymphocytes Relative: 18 %
Lymphs Abs: 1 10*3/uL (ref 0.7–4.0)
MCH: 32.6 pg (ref 26.0–34.0)
MCHC: 32.6 g/dL (ref 30.0–36.0)
MCV: 100 fL (ref 80.0–100.0)
Monocytes Absolute: 0.8 10*3/uL (ref 0.1–1.0)
Monocytes Relative: 15 %
NEUTROS ABS: 3.3 10*3/uL (ref 1.7–7.7)
Neutrophils Relative %: 64 %
Platelets: 52 10*3/uL — ABNORMAL LOW (ref 150–400)
RBC: 2.73 MIL/uL — ABNORMAL LOW (ref 3.87–5.11)
RDW: 20.8 % — ABNORMAL HIGH (ref 11.5–15.5)
WBC: 5.2 10*3/uL (ref 4.0–10.5)
nRBC: 0 % (ref 0.0–0.2)

## 2018-03-17 MED ORDER — POTASSIUM CHLORIDE CRYS ER 20 MEQ PO TBCR
20.0000 meq | EXTENDED_RELEASE_TABLET | Freq: Two times a day (BID) | ORAL | Status: AC
  Administered 2018-03-17 – 2018-03-18 (×4): 20 meq via ORAL
  Filled 2018-03-17 (×4): qty 1

## 2018-03-17 MED ORDER — ALUM & MAG HYDROXIDE-SIMETH 200-200-20 MG/5ML PO SUSP
30.0000 mL | Freq: Once | ORAL | Status: AC
  Administered 2018-03-17: 30 mL via ORAL
  Filled 2018-03-17 (×2): qty 30

## 2018-03-17 MED ORDER — LIDOCAINE VISCOUS HCL 2 % MT SOLN
15.0000 mL | Freq: Once | OROMUCOSAL | Status: AC
  Administered 2018-03-17: 15 mL via OROMUCOSAL
  Filled 2018-03-17 (×2): qty 15

## 2018-03-17 NOTE — Plan of Care (Signed)
  Problem: Consults Goal: RH STROKE PATIENT EDUCATION Description See Patient Education module for education specifics  Outcome: Progressing   Problem: RH SKIN INTEGRITY Goal: RH STG SKIN FREE OF INFECTION/BREAKDOWN Description With min assist  Outcome: Progressing Goal: RH STG MAINTAIN SKIN INTEGRITY WITH ASSISTANCE Description STG Maintain Skin Integrity With min Assistance.  Outcome: Progressing Flowsheets (Taken 03/17/2018 1334) STG: Maintain skin integrity with assistance: 4-Minimal assistance   Problem: RH SAFETY Goal: RH STG ADHERE TO SAFETY PRECAUTIONS W/ASSISTANCE/DEVICE Description STG Adhere to Safety Precautions With reminders Assistance/Device.  Outcome: Progressing Flowsheets (Taken 03/17/2018 1334) STG:Pt will adhere to safety precautions with assistance/device: 4-Minimal assistance   Problem: RH KNOWLEDGE DEFICIT Goal: RH STG INCREASE KNOWLEGDE OF HYPERLIPIDEMIA Description Pt will be able to state management of HLD using medications and dietary restrictions using handouts and resources independently  Outcome: Progressing Goal: RH STG INCREASE KNOWLEDGE OF STROKE PROPHYLAXIS Description Pt will be able to state management of secondary stroke risk factors using medications and dietary restrictions using handouts/resources independently  Outcome: Progressing

## 2018-03-17 NOTE — Progress Notes (Signed)
Occupational Therapy Session Note  Patient Details  Name: Nancy Norris MRN: [id number] Date of Birth: 08-Jun-1940  Today's Date: 03/17/2018 OT Individual Time: [phone] OT Individual Time Calculation (min): 38 min  Missed 22 minutes secondary to fatigue  Short Term Goals: Week 1:  OT Short Term Goal 1 (Week 1): STGs=LTGs due to ELOS  Skilled Therapeutic Interventions/Progress Updates:    Upon entering the room, pt supine in bed and reports feeling very chilled. OT assisted pt with blankets and heat in room for comfort. Pt also appearing to very restless and reports feeling anxious this session. PA and RN notified of anxiety at this time. OT discussed OT purpose, goals , and expectations for participation in inpatient rehab with pt verbalizing understanding. Pt verbalized feeling "wiped out" and declined OOB activities this session. OT educating pt on energy conservation for self care tasks and safety mobility at home and in the community. Pt very active participant in discussion and agreeable to education. Pt remained in bed to rest at end of session with call bell and all needed items within reach. PA also making pt 15/7 to increase pt's participation in therapeutic intervention.   Therapy Documentation Precautions:  Precautions Precautions: Fall Precaution Comments: Portacath + fistula Lt forearm  Restrictions Weight Bearing Restrictions: No    ADL: ADL Grooming: Setup Where Assessed-Grooming: Edge of bed Upper Body Bathing: Supervision/safety Where Assessed-Upper Body Bathing: Edge of bed Lower Body Bathing: Minimal assistance Where Assessed-Lower Body Bathing: Edge of bed Upper Body Dressing: Supervision/safety Where Assessed-Upper Body Dressing: Edge of bed Lower Body Dressing: Minimal assistance Where Assessed-Lower Body Dressing: Edge of bed Toileting: Not assessed Toilet Transfer: Minimal assistance Toilet Transfer Method: Arts development officer: Research officer, political party: (due to portacath and no shower orders from MD)   Therapy/Group: Individual Therapy  Gypsy Decant 03/17/2018, 1:33 PM

## 2018-03-17 NOTE — Progress Notes (Signed)
Per nursing, patient was given "Data Collection Information Summary for Patients in Inpatient Rehabilitation Facilities with attached Privacy Act Statement Health Care Records" upon admission.    Patient information reviewed and entered into eRehab System by Becky Neida Ellegood, PPS coordinator. Information including medical coding, function ability, and quality indicators will be reviewed and updated through discharge.   

## 2018-03-17 NOTE — Progress Notes (Signed)
Call the PA on call to get an order for an epigastric pain, Gi cocktail was ordered .

## 2018-03-17 NOTE — Care Management Note (Signed)
[facility] Individual Statement of Services  Patient Name:  Nancy Norris  Date:  03/17/2018  Welcome to the [facility].  Our goal is to provide you with an individualized program based on your diagnosis and situation, designed to meet your specific needs.  With this comprehensive rehabilitation program, you will be expected to participate in at least 3 hours of rehabilitation therapies Monday-Friday, with modified therapy programming on the weekends.  Your rehabilitation program will include the following services:  Physical Therapy (PT), Occupational Therapy (OT), Speech Therapy (ST), 24 hour per day rehabilitation nursing, Neuropsychology, Case Management (Social Worker), Rehabilitation Medicine, Nutrition Services and Pharmacy Services  Weekly team conferences will be held on Wednesday to discuss your progress.  Your Social Worker will talk with you frequently to get your input and to update you on team discussions.  Team conferences with you and your family in attendance may also be held.  Expected length of stay: 6-10 days  Overall anticipated outcome: supervision with cues  Depending on your progress and recovery, your program may change. Your Social Worker will coordinate services and will keep you informed of any changes. Your Social Worker's name and contact numbers are listed  below.  The following services may also be recommended but are not provided by the [facility]:    [place] will be made to provide these services after discharge if needed.  Arrangements include referral to agencies that provide these services.  Your insurance has been verified to be:  [place] Your primary doctor is:  [name]  Pertinent information will be shared with your doctor and your insurance company.  Social Worker:  Ovidio Kin, [place] or ([phone]  Information discussed with and copy given to patient by: [name], 03/17/2018, 9:15 AM

## 2018-03-17 NOTE — Evaluation (Signed)
Speech Language Pathology Assessment and Plan  Patient Details  Name: Nancy Norris MRN: [id number] Date of Birth: 05-22-1940  SLP Diagnosis: Cognitive Impairments;Speech and Language deficits  Rehab Potential:  Fair ELOS: 7 to 10 days    Today's Date: 03/17/2018 SLP Individual Time: 1100-1200 SLP Individual Time Calculation (min): 60 min   Problem List:  Patient Active Problem List   Diagnosis Date Noted  . Left middle cerebral artery stroke ([place]) 03/15/2018  . Hypercoagulable state, secondary ([place]) 03/14/2018  . Middle cerebral artery embolism, left 03/12/2018  . Subcortical infarction ([place]) 03/11/2018  . Pancytopenia ([place])   . NSTEMI (non-ST elevated myocardial infarction) ([place])   . Dyslipidemia   . Hypotension   . Acute DVT (deep venous thrombosis) ([place])   . Malignant neoplasm of stomach ([place])   . Acute cardioembolic stroke ([place])   . CKD (chronic kidney disease), stage III ([place])   . Dysphagia   . CKD (chronic kidney disease), stage IV ([place])   . Stroke ([place]) 03/03/2018  . Port catheter in place 09/18/2016  . Cryptococcal pneumonitis ([place]) 11/22/2015  . Symptomatic anemia 03/31/2015  . B12 deficiency 12/04/2014  . Thrombocytopenia ([place]) 12/04/2014  . Encounter for antineoplastic chemotherapy 06/21/2014  . CKD (chronic kidney disease) stage 5, GFR less than 15 ml/min (H[facility]) 04/02/2014  . Multiple myeloma ([place]) 12/02/2013  . Major depressive disorder, recurrent episode ([place]) 11/02/2013  . Medication management 09/30/2013  . Vitamin D deficiency 03/24/2013  . Mixed hyperlipidemia 02/24/2013  . COPD (chronic obstructive pulmonary disease) with emphysema ([place]) 02/20/2013  . GERD (gastroesophageal reflux disease) 01/20/2013  . HTN (hypertension) 01/14/2013  . Asthma, chronic 01/13/2013  . Hypothyroid 01/13/2013  . Chronic diastolic heart failure ([place]) 01/13/2013  . HX: breast cancer 12/08/2012  . Hypercalcemia 12/02/2012   Past Medical History:  Past Medical History:   Diagnosis Date  . Anemia   . Anginal pain ([place])    used NTG x 2 May 31 and 06/15/13   . Anxiety   . Arthritis   . B12 deficiency 12/04/2014  . Breast cancer ([place])   . Complication of anesthesia   . COPD (chronic obstructive pulmonary disease) ([place])   . Cryptococcal pneumonitis ([place]) 11/22/2015  . Depression   . Dizziness   . Dyspnea   . Fibromyalgia   . Fibromyalgia   . GERD (gastroesophageal reflux disease)   . Headache(784.0)   . Heart murmur   . Hemoptysis 10/21/2015  . History of blood transfusion    last one May 12   . Hx of cardiovascular stress test    LexiScan with low level exercise Myoview (02/2013): No ischemia, EF 72%; normal study  . Hx of echocardiogram    a.  Echocardiogram (12/26/2012): EF 66-59%, grade 1 diastolic dysfunction;   b.  Echocardiogram (02/2013): EF 55-60%, no WMA, trivial effusion  . Hyperkalemia   . Hyponatremia   . Hypotension   . Hypothyroidism   . Mucositis   . Multiple myeloma   . Myocardial infarction [facility])    in past, patient was unaware.   . Neuropathy   . Nodule of left lung 09/13/2015  . Pain in joint, pelvic region and thigh 07/07/2015  . Pneumonia    several  . PONV (postoperative nausea and vomiting) 2008   after mastestomy   Past Surgical History:  Past Surgical History:  Procedure Laterality Date  . ABDOMINAL HYSTERECTOMY  1981  . AV FISTULA PLACEMENT Left 06/19/2013   Procedure: CREATION OF LEFT ARM ARTERIOVENOUS (AV)  FISTULA ;  Surgeon: Angelia Mould, MD;  Location: [facility];  Service: Vascular;  Laterality: Left;  . BIOPSY  03/08/2018   Procedure: BIOPSY;  Surgeon: Wilford Corner, MD;  Location: [facility];  Service: Endoscopy;;  . BREAST RECONSTRUCTION    . BREAST SURGERY Right    reduction  . CATARACT EXTRACTION, BILATERAL    . CHOLECYSTECTOMY  1971  . COLONOSCOPY    . ESOPHAGOGASTRODUODENOSCOPY (EGD) WITH PROPOFOL N/A 03/08/2018   Procedure: ESOPHAGOGASTRODUODENOSCOPY (EGD) WITH PROPOFOL;  Surgeon: Wilford Corner, MD;  Location: [facility];  Service: Endoscopy;  Laterality: N/A;  . EYE SURGERY Bilateral    lens implant  . history of Port removal    . IR CT HEAD LTD  03/12/2018  . IR IVC FILTER PLMT / S&I /IMG GUID/MOD SED  03/09/2018  . IR PERCUTANEOUS ART THROMBECTOMY/INFUSION INTRACRANIAL INC DIAG ANGIO  03/12/2018  . LUNG BIOPSY  10/21/2015  . MASTECTOMY Left 2008  . PORTACATH PLACEMENT  12/2012   has had 2  . RESECTION OF ARTERIOVENOUS FISTULA ANEURYSM Left 06/04/2017   Procedure: LIGATION ANEURYSM OF LEFT ARTERIOVENOUS FISTULA;  Surgeon: Angelia Mould, MD;  Location: [facility];  Service: Vascular;  Laterality: Left;  . Status post stem cell transplant on September 28, 2008.    . TEE WITHOUT CARDIOVERSION N/A 03/07/2018   Procedure: TRANSESOPHAGEAL ECHOCARDIOGRAM (TEE);  Surgeon: Buford Dresser, MD;  Location: [facility] ENDOSCOPY;  Service: Cardiovascular;  Laterality: N/A;    Assessment / Plan / Recommendation Clinical Impression Nancy Norris is a 78 year old right-handed female she does have a history of multiple myeloma, remote breast cancer as well as COPD with remote tobacco abuse 13 years ago, medical history also complicated by chronic anemia with thrombocytopenia, fibromyalgia as well as CK D stage IV with creatinine 2.29-2.93 andhypotension maintained on ProAmatine. Per chart review she lives alone and was independent prior to admission she has a daughter in the area. One level home with ramped entrance. Patient initially presented 03/03/2018 with altered mental status difficulty swallowing as well as headache 2 weeks.  On this latest admission cranial CT scan repeated showing 2 separate areas of abnormal cortical and subcortical edema in the left hemisphere affecting the left parietal lobe occipital lobes that were not present on 02/26/2018. MRI the brain showed left greater than right small to moderate size multifocal infarctions. Per report, acute ischemia within multi  vascular territories in both hemispheres. MRA was unremarkable. An upper GI endoscopy completed 03/08/2020 per Dr. Michail Sermon showed a large fungating malignant appearing ulcerated proximal stomach mass that had areas of blood seen consistent with recent bleeding and and biopsies obtained. Mass likely causing partial obstruction which was contributing to her dysphagia. Bulky lymphadenopathy in the gastrohepatic ligament compatible with metastatic disease. There was a 2.7 cm hypoattenuating lesion in the spleen as well as scattered tiny pulmonary nodules measuring up to 5 mm and mild lymphadenopathy noted in the upper mediastinum 1.9 x 1.2 cm irregular lesion posterior left upper lobe at the site of a 4 cm mass that was also noted on PET scan 10/11/2015 features compatible with residual of that lesion. Oncology services Dr. Earlie Server follow-up in regards to findings pathology report remain pending current plan was to await biopsy and make recommendations about management of suspicious malignancy. Hospital course complicated by findings of acute DVT left posterior tibial vein and left peroneal vein radiology services consulted 03/09/2018 underwent IVC filter placement by Dr. Barbie Banner of interventional radiology. Therapy evaluations completed and patient was  admitted to inpatient rehabilitation services 03/11/2018. On the afternoon of 03/12/2018 patient became more confused and garbled speech/expressive aphasia and bilateral upper extremity increased weakness as well as increased tone. Neurology service was confused and garbled speech/expressive aphasia and bilateral upper extremity increased weakness as well as increased tone. Neurology service was consulted stat cranial CT scan obtained that showed evolving subacute infarctions without evidence of new intracranial abnormality. Patient was discharged to acute care services 03/12/2018 for further evaluation. CT angiogram of head and neck showed high-grade stenosis versus  nonocclusive thrombus involving the proximal left superior M2 division. Interventional radiology consulted and underwent complete revascularization of occluded left MCA distal M1 occlusion.Her diet remains a regular consistency. Pathology report of gastric mass returned poorly differentiated adenocarcinoma of the stomach and follow-up oncology services and plan to follow-up as outpatient. Therapy evaluations again have been completed after latest event and recommendations were to return to inpatient rehabilitation services for comprehensive therapies on 03/16/18.   Comprehensive bedside swallow and speech-language evaluation completed on 03/17/18. Pt consumed regular solids and thin liquids via straw without overt s/s of aspiration or dyspagia. N further services indicated for dysphagia. Additionally, pt presents with mild expressive aphasia c/b dysfluencies, anomia, perseverations, generic islands of fluid speech and decreased prosody in effect to produce correct words. Cognitively, pt has deficits in semi-complex problem solving, selective attention, reasoning and overall awareness. Skilled ST is indicated to increase expressive abilities, ncrease functional independence and reduce caregiver burden. Pt is likely to require Min A at discharge and prognosis for returning to independent living is doubtful at this time.    Skilled Therapeutic Interventions          Skilled treatment session primary consisted of completing above mentioned evaluations, see above. Session also included education with pt's daughter on expressive aphasia and likelihood that pt will require more than 2 weeks of help from daughter at home. POC explained and all questions answered to her satisfaction.    SLP Assessment  Patient will need skilled Speech Lanaguage Pathology Services during CIR admission    Recommendations  SLP Diet Recommendations: Age appropriate regular solids;Thin Liquid Administration via: Cup;Straw Medication  Administration: Whole meds with liquid Supervision: Patient able to self feed Compensations: Slow rate;Small sips/bites;Minimize environmental distractions Postural Changes and/or Swallow Maneuvers: Seated upright 90 degrees Oral Care Recommendations: Oral care BID Recommendations for Other Services: Neuropsych consult Patient destination: Home Follow up Recommendations: 24 hour supervision/assistance;Home Health SLP Equipment Recommended: None recommended by SLP    SLP Frequency 3 to 5 out of 7 days   SLP Duration  SLP Intensity  SLP Treatment/Interventions 7 to 10 days  Minumum of 1-2 x/day, 30 to 90 minutes  Cognitive remediation/compensation;Internal/external aids;Functional tasks;Cueing hierarchy;Patient/family education;Other (comment)    Pain Pain Assessment Pain Scale: 0-10 Pain Score: 0-No pain  Prior Functioning Cognitive/Linguistic Baseline: Baseline deficits Type of Home: Apartment  Lives With: Alone Available Help at Discharge: Family;Available PRN/intermittently Vocation: Retired  Industrial/product designer Term Goals: Week 1: SLP Short Term Goal 1 (Week 1): Pt will complete functional basic problem solving tasks with Min A. SLP Short Term Goal 2 (Week 1): Pt will utilize word finding strategies to produce sentence length utterances with Mod A cues in 8 out of 10 opportunities.  SLP Short Term Goal 3 (Week 1): Pt will demonstrate selective attention for ~ 30 minutes in mildy distracting environment with Min A cues.   Refer to Care Plan for Long Term Goals  Recommendations for other services: Neuropsych  Discharge Criteria: Patient  will be discharged from SLP if patient refuses treatment 3 consecutive times without medical reason, if treatment goals not met, if there is a change in medical status, if patient makes no progress towards goals or if patient is discharged from hospital.  The above assessment, treatment plan, treatment alternatives and goals were discussed and mutually  agreed upon: by patient and by family  Halima Fogal 03/17/2018, 2:26 PM

## 2018-03-17 NOTE — Progress Notes (Signed)
Social Work Patient ID: Nancy Norris, female   DOB: 16-May-1940, 78 y.o.   MRN: [id number]   Nancy Hashimoto, LCSW  Social Worker  Physical Medicine and Rehabilitation  Progress Notes  Signed  Date of Service:  03/12/2018 12:52 PM       Related encounter: Admission (Discharged) from 03/11/2018 in [facility]        Show:Clear all _0 Manual_1 Template_2 Copied  Added by: _3 Nancy Hashimoto, LCSW  _4 Hover for details Social Work  Social Work Assessment and Plan   Patient Details  Name: Nancy Norris MRN: [id number] Date of Birth: Mar 14, 1940   Today's Date: 03/12/2018   Problem List:      Patient Active Problem List    Diagnosis Date Noted  . Subcortical infarction ([place]) 03/11/2018  . Pancytopenia ([place])    . NSTEMI (non-ST elevated myocardial infarction) ([place])    . Dyslipidemia    . Hypotension    . Acute DVT (deep venous thrombosis) ([place])    . Malignant neoplasm of stomach ([place])    . Acute cardioembolic stroke ([place])    . CKD (chronic kidney disease), stage III ([place])    . Dysphagia    . CKD (chronic kidney disease), stage IV ([place])    . Stroke ([place]) 03/03/2018  . Port catheter in place 09/18/2016  . Cryptococcal pneumonitis ([place]) 11/22/2015  . Symptomatic anemia 03/31/2015  . B12 deficiency 12/04/2014  . Thrombocytopenia ([place]) 12/04/2014  . Encounter for antineoplastic chemotherapy 06/21/2014  . CKD (chronic kidney disease) stage 5, GFR less than 15 ml/min (HCC) 04/02/2014  . Multiple myeloma ([place]) 12/02/2013  . Major depressive disorder, recurrent episode ([place]) 11/02/2013  . Medication management 09/30/2013  . Vitamin D deficiency 03/24/2013  . Mixed hyperlipidemia 02/24/2013  . COPD (chronic obstructive pulmonary disease) with emphysema ([place]) 02/20/2013  . GERD (gastroesophageal reflux disease) 01/20/2013  . HTN (hypertension) 01/14/2013  . Asthma, chronic 01/13/2013  . Hypothyroid 01/13/2013  . Chronic  diastolic heart failure ([place]) 01/13/2013  . HX: breast cancer 12/08/2012  . Hypercalcemia 12/02/2012    Past Medical History:      Past Medical History:  Diagnosis Date  . Anemia    . Anginal pain ([place])      used NTG x 2 May 31 and 06/15/13   . Anxiety    . Arthritis    . B12 deficiency 12/04/2014  . Breast cancer ([place])    . Complication of anesthesia    . COPD (chronic obstructive pulmonary disease) ([place])    . Cryptococcal pneumonitis ([place]) 11/22/2015  . Depression    . Dizziness    . Dyspnea    . Fibromyalgia    . Fibromyalgia    . GERD (gastroesophageal reflux disease)    . Headache(784.0)    . Heart murmur    . Hemoptysis 10/21/2015  . History of blood transfusion      last one May 12   . Hx of cardiovascular stress test      LexiScan with low level exercise Myoview (02/2013): No ischemia, EF 72%; normal study  . Hx of echocardiogram      a.  Echocardiogram (12/26/2012): EF 71-16%, grade 1 diastolic dysfunction;   b.  Echocardiogram (02/2013): EF 55-60%, no WMA, trivial effusion  . Hyperkalemia    . Hyponatremia    . Hypotension    . Hypothyroidism    . Mucositis    . Multiple myeloma    .  Myocardial infarction [facility])      in past, patient was unaware.   . Neuropathy    . Nodule of left lung 09/13/2015  . Pain in joint, pelvic region and thigh 07/07/2015  . Pneumonia      several  . PONV (postoperative nausea and vomiting) 2008    after mastestomy    Past Surgical History:       Past Surgical History:  Procedure Laterality Date  . ABDOMINAL HYSTERECTOMY   1981  . AV FISTULA PLACEMENT Left 06/19/2013    Procedure: CREATION OF LEFT ARM ARTERIOVENOUS (AV) FISTULA ;  Surgeon: Angelia Mould, MD;  Location: [place];  Service: Vascular;  Laterality: Left;  . BIOPSY   03/08/2018    Procedure: BIOPSY;  Surgeon: Wilford Corner, MD;  Location: [facility];  Service: Endoscopy;;  . BREAST RECONSTRUCTION      . BREAST SURGERY Right      reduction  . CATARACT  EXTRACTION, BILATERAL      . CHOLECYSTECTOMY   1971  . COLONOSCOPY      . ESOPHAGOGASTRODUODENOSCOPY (EGD) WITH PROPOFOL N/A 03/08/2018    Procedure: ESOPHAGOGASTRODUODENOSCOPY (EGD) WITH PROPOFOL;  Surgeon: Wilford Corner, MD;  Location: [place];  Service: Endoscopy;  Laterality: N/A;  . EYE SURGERY Bilateral      lens implant  . history of Port removal      . IR IVC FILTER PLMT / S&I /IMG GUID/MOD SED   03/09/2018  . LUNG BIOPSY   10/21/2015  . MASTECTOMY Left 2008  . PORTACATH PLACEMENT   12/2012    has had 2  . RESECTION OF ARTERIOVENOUS FISTULA ANEURYSM Left 06/04/2017    Procedure: LIGATION ANEURYSM OF LEFT ARTERIOVENOUS FISTULA;  Surgeon: Angelia Mould, MD;  Location: [place];  Service: Vascular;  Laterality: Left;  . Status post stem cell transplant on September 28, 2008.      . TEE WITHOUT CARDIOVERSION N/A 03/07/2018    Procedure: TRANSESOPHAGEAL ECHOCARDIOGRAM (TEE);  Surgeon: Buford Dresser, MD;  Location[facility] ENDOSCOPY;  Service: Cardiovascular;  Laterality: N/A;    Social History:  reports that she quit smoking about 13 years ago. Her smoking use included cigarettes. She has a 30.00 pack-year smoking history. She has never used smokeless tobacco. She reports that she does not drink alcohol or use drugs.   Family / Support Systems Marital Status: Single Patient Roles: Other (Comment), Parent Children: [name] [phone] Other Supports: [name] [phone] Anticipated Caregiver: Self Ability/Limitations of Caregiver: [name] works and [name] is a Quarry manager but can come by and check on her along with her daughter coming in the evenings Caregiver Availability: Intermittent Family Dynamics: Close with her daughter who is local and works for [employer]. She has friends who are supportive and a close friend-Sheila who will check on her and is never far away.    Social History Preferred language: English Religion: Baptist Cultural  Background: No issues Education: [employer] Read: Yes Write: Yes Employment Status: Retired Public relations account executive Issues: No issues Guardian/Conservator: None-according to MD pt is not fully capable of making her own decisions will look toward her daughter since she is next of kin.    Abuse/Neglect Abuse/Neglect Assessment Can Be Completed: Yes Physical Abuse: Denies Verbal Abuse: Denies Sexual Abuse: Denies Exploitation of patient/patient's resources: Denies Self-Neglect: Denies   Emotional Status [name] affect, behavior and adjustment status: Pt is motivated to do well here and feels she is recovering, her mian issues are high level, spelling and how  to work her phone, which is new. She takes more time due to she fatigues more easily and has been like this with her cancern diagnosis.  She has always been one who care for herself and wants to be able to do this at discharge from ehre. Recent Psychosocial Issues: cancer treatments and follow up appointments Psychiatric History: History of anxiety takes medications for this and finds it helpful. She would benefit from seeing neuro-psych while here if here long enough. She is doing well and recovering from her stroke. Substance Abuse History: No issues   Patient / Family Perceptions, Expectations & Goals Pt/Family understanding of illness & functional limitations: Pt does talk with her many MD's and feels she has a good understanding of her condition and treatment going forward. SHe knows she will have a OP appointment with Dr. Rogue Jury to discuss options for treamtent with this new mass found. Premorbid pt/family roles/activities: Mom, friend, retiree, church member, etc Anticipated changes in roles/activities/participation: resume Pt/family expectations/goals: Pt states: " I want to be able to take care of myself like I was doing before this."  Daughter states: " I hope she does well but she pushes herself hard and over does it at  times."   [employer]: Other (Comment)(Cancer Center pt) Premorbid Home Care/DME Agencies: Other (Comment)(has rw, tub seat, rollator) Transportation available at discharge: Daughter, SCAT and cancer center trasnportation Resource referrals recommended: Neuropsychology, Support group (specify)   Discharge Planning Living Arrangements: Alone Support Systems: Children, Friends/neighbors, Church/faith community Type of Residence: Private residence Insurance Resources: [employer], Multimedia programmer (specify)(BCBS) Financial Screen Referred: No Living Expenses: Rent Money Management: Patient Does the patient have any problems obtaining your medications?: No Home Management: Self Patient/Family Preliminary Plans: Return to her apartment and have her daughter and friend check on her intermittently. She is high level and moving well she does have some high level thought processing and using her iphone difficulties, which is new. Sw Barriers to Discharge: Decreased caregiver support, Medical stability Sw Barriers to Discharge Comments: Does not ahve 24 hr care Social Work Anticipated Follow Up Needs: HH/OP, Support Group   Clinical Impression Very pleasant female who is motivated and at times pushes herself too hard. Her daughter and friend are involved but can not provide 24 hr care. So she will need to be mod/i to be able to return home alone safely. Will see if here long enough to see neuro-psych she has a lot going on with her health, would benefit from seeing while here.   Nancy Norris 03/12/2018, 12:52 PM             Analysia Dungee, Gardiner Rhyme, LCSW  Social Worker  Physical Medicine and Rehabilitation  Progress Notes  Signed  Date of Service:  03/12/2018 12:52 PM       Related encounter: Admission (Discharged) from 03/11/2018 in [facility]        Show:Clear all _0 Manual_1 Template_2 Copied  Added  by: _3 Nancy Hashimoto, LCSW  _4 Hover for details Social Work  Social Work Assessment and Plan   Patient Details  Name: Nancy Norris MRN: [id number] Date of Birth: 1940/07/30   Today's Date: 03/12/2018   Problem List:      Patient Active Problem List    Diagnosis Date Noted  . Subcortical infarction ([place]) 03/11/2018  . Pancytopenia ([place])    . NSTEMI (non-ST elevated myocardial infarction) ([place])    . Dyslipidemia    .  Hypotension    . Acute DVT (deep venous thrombosis) ([place])    . Malignant neoplasm of stomach ([place])    . Acute cardioembolic stroke ([place])    . CKD (chronic kidney disease), stage III ([place])    . Dysphagia    . CKD (chronic kidney disease), stage IV ([place])    . Stroke ([place]) 03/03/2018  . Port catheter in place 09/18/2016  . Cryptococcal pneumonitis ([place]) 11/22/2015  . Symptomatic anemia 03/31/2015  . B12 deficiency 12/04/2014  . Thrombocytopenia ([place]) 12/04/2014  . Encounter for antineoplastic chemotherapy 06/21/2014  . CKD (chronic kidney disease) stage 5, GFR less than 15 ml/min (HCC) 04/02/2014  . Multiple myeloma ([place]) 12/02/2013  . Major depressive disorder, recurrent episode ([place]) 11/02/2013  . Medication management 09/30/2013  . Vitamin D deficiency 03/24/2013  . Mixed hyperlipidemia 02/24/2013  . COPD (chronic obstructive pulmonary disease) with emphysema ([place]) 02/20/2013  . GERD (gastroesophageal reflux disease) 01/20/2013  . HTN (hypertension) 01/14/2013  . Asthma, chronic 01/13/2013  . Hypothyroid 01/13/2013  . Chronic diastolic heart failure ([place]) 01/13/2013  . HX: breast cancer 12/08/2012  . Hypercalcemia 12/02/2012    Past Medical History:      Past Medical History:  Diagnosis Date  . Anemia    . Anginal pain ([place])      used NTG x 2 May 31 and 06/15/13   . Anxiety    . Arthritis    . B12 deficiency 12/04/2014  . Breast cancer ([place])    . Complication of anesthesia    . COPD (chronic obstructive pulmonary disease) ([place])    .  Cryptococcal pneumonitis ([place]) 11/22/2015  . Depression    . Dizziness    . Dyspnea    . Fibromyalgia    . Fibromyalgia    . GERD (gastroesophageal reflux disease)    . Headache(784.0)    . Heart murmur    . Hemoptysis 10/21/2015  . History of blood transfusion      last one May 12   . Hx of cardiovascular stress test      LexiScan with low level exercise Myoview (02/2013): No ischemia, EF 72%; normal study  . Hx of echocardiogram      a.  Echocardiogram (12/26/2012): EF 19-14%, grade 1 diastolic dysfunction;   b.  Echocardiogram (02/2013): EF 55-60%, no WMA, trivial effusion  . Hyperkalemia    . Hyponatremia    . Hypotension    . Hypothyroidism    . Mucositis    . Multiple myeloma    . Myocardial infarction [facility])      in past, patient was unaware.   . Neuropathy    . Nodule of left lung 09/13/2015  . Pain in joint, pelvic region and thigh 07/07/2015  . Pneumonia      several  . PONV (postoperative nausea and vomiting) 2008    after mastestomy    Past Surgical History:       Past Surgical History:  Procedure Laterality Date  . ABDOMINAL HYSTERECTOMY   1981  . AV FISTULA PLACEMENT Left 06/19/2013    Procedure: CREATION OF LEFT ARM ARTERIOVENOUS (AV) FISTULA ;  Surgeon: Angelia Mould, MD;  Location: [facility];  Service: Vascular;  Laterality: Left;  . BIOPSY   03/08/2018    Procedure: BIOPSY;  Surgeon: Wilford Corner, MD;  Location: [place];  Service: Endoscopy;;  . BREAST RECONSTRUCTION      . BREAST SURGERY Right      reduction  . CATARACT EXTRACTION, BILATERAL      .  CHOLECYSTECTOMY   1971  . COLONOSCOPY      . ESOPHAGOGASTRODUODENOSCOPY (EGD) WITH PROPOFOL N/A 03/08/2018    Procedure: ESOPHAGOGASTRODUODENOSCOPY (EGD) WITH PROPOFOL;  Surgeon: Wilford Corner, MD;  Location: [place];  Service: Endoscopy;  Laterality: N/A;  . EYE SURGERY Bilateral      lens implant  . history of Port removal      . IR IVC FILTER PLMT / S&I /IMG GUID/MOD SED   03/09/2018  .  LUNG BIOPSY   10/21/2015  . MASTECTOMY Left 2008  . PORTACATH PLACEMENT   12/2012    has had 2  . RESECTION OF ARTERIOVENOUS FISTULA ANEURYSM Left 06/04/2017    Procedure: LIGATION ANEURYSM OF LEFT ARTERIOVENOUS FISTULA;  Surgeon: Angelia Mould, MD;  Location: [place];  Service: Vascular;  Laterality: Left;  . Status post stem cell transplant on September 28, 2008.      . TEE WITHOUT CARDIOVERSION N/A 03/07/2018    Procedure: TRANSESOPHAGEAL ECHOCARDIOGRAM (TEE);  Surgeon: Buford Dresser, MD;  Location: [facility] ENDOSCOPY;  Service: Cardiovascular;  Laterality: N/A;    Social History:  reports that she quit smoking about 13 years ago. Her smoking use included cigarettes. She has a 30.00 pack-year smoking history. She has never used smokeless tobacco. She reports that she does not drink alcohol or use drugs.   Family / Support Systems Marital Status: Single Patient Roles: Other (Comment), Parent Children: [name] [phone] Other Supports: [name] [phone] Anticipated Caregiver: Self Ability/Limitations of Caregiver: [name] works and [name] is a Quarry manager but can come by and check on her along with her daughter coming in the evenings Caregiver Availability: Intermittent Family Dynamics: Close with her daughter who is local and works for [employer]. She has friends who are supportive and a close friend-Sheila who will check on her and is never far away.    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    Abuse/Neglect Abuse/Neglect Assessment Can Be Completed: Yes Physical Abuse: Denies Verbal Abuse: Denies Sexual Abuse:  Denies Exploitation of patient/patient's resources: Denies Self-Neglect: Denies   Emotional Status [name] affect, behavior and adjustment status: Pt is motivated to do well here and feels she is recovering, her mian issues are high level, spelling and how to work her phone, which is new. She takes more time due to she fatigues more easily and has been like this with her cancern diagnosis.  She has always been one who care for herself and wants to be able to do this at discharge from ehre. Recent Psychosocial Issues: cancer treatments and follow up appointments Psychiatric History: History of anxiety takes medications for this and finds it helpful. She would benefit from seeing neuro-psych while here if here long enough. She is doing well and recovering from her stroke. Substance Abuse History: No issues   Patient / Family Perceptions, Expectations & Goals Pt/Family understanding of illness & functional limitations: Pt does talk with her many MD's and feels she has a good understanding of her condition and treatment going forward. SHe knows she will have a OP appointment with Dr. Rogue Jury to discuss options for treamtent with this new mass found. Premorbid pt/family roles/activities: Mom, friend, retiree, church member, etc Anticipated changes in roles/activities/participation: resume Pt/family expectations/goals: Pt states: "  I want to be able to take care of myself like I was doing before this."  Daughter states: " I hope she does well but she pushes herself hard and over does it at times."   [employer]: Other (Comment)(Cancer Center pt) Premorbid Home Care/DME Agencies: Other (Comment)(has rw, tub seat, rollator) Transportation available at discharge: Daughter, SCAT and cancer center trasnportation Resource referrals recommended: Neuropsychology, Support group (specify)   Discharge Planning Living Arrangements: Alone Support Systems: Children, Friends/neighbors,  Church/faith community Type of Residence: Private residence Insurance Resources: [employer], Multimedia programmer (specify)(BCBS) Financial Screen Referred: No Living Expenses: Rent Money Management: Patient Does the patient have any problems obtaining your medications?: No Home Management: Self Patient/Family Preliminary Plans: Return to her apartment and have her daughter and friend check on her intermittently. She is high level and moving well she does have some high level thought processing and using her iphone difficulties, which is new. Sw Barriers to Discharge: Decreased caregiver support, Medical stability Sw Barriers to Discharge Comments: Does not ahve 24 hr care Social Work Anticipated Follow Up Needs: HH/OP, Support Group   Clinical Impression Very pleasant female who is motivated and at times pushes herself too hard. Her daughter and friend are involved but can not provide 24 hr care. So she will need to be mod/i to be able to return home alone safely. Will see if here long enough to see neuro-psych she has a lot going on with her health, would benefit from seeing while here.   Nancy Norris 03/12/2018, 12:52 PM             Gessica Jawad, Gardiner Rhyme, LCSW  Social Worker  Physical Medicine and Rehabilitation  Progress Notes  Signed  Date of Service:  03/12/2018 12:52 PM       Related encounter: Admission (Discharged) from 03/11/2018 in [facility]        Show:Clear all _0 Manual_1 Template_2 Copied  Added by: _3 Nancy Hashimoto, LCSW  _4 Hover for details Social Work  Social Work Assessment and Plan   Patient Details  Name: Nancy Norris MRN: [id number] Date of Birth: Oct 17, 1940   Today's Date: 03/12/2018   Problem List:      Patient Active Problem List    Diagnosis Date Noted  . Subcortical infarction ([place]) 03/11/2018  . Pancytopenia ([place])    . NSTEMI (non-ST elevated myocardial infarction) ([place])    .  Dyslipidemia    . Hypotension    . Acute DVT (deep venous thrombosis) ([place])    . Malignant neoplasm of stomach ([place])    . Acute cardioembolic stroke ([place])    . CKD (chronic kidney disease), stage III ([place])    . Dysphagia    . CKD (chronic kidney disease), stage IV ([place])    . Stroke ([place]) 03/03/2018  . Port catheter in place 09/18/2016  . Cryptococcal pneumonitis ([place]) 11/22/2015  . Symptomatic anemia 03/31/2015  . B12 deficiency 12/04/2014  . Thrombocytopenia ([place]) 12/04/2014  . Encounter for antineoplastic chemotherapy 06/21/2014  . CKD (chronic kidney disease) stage 5, GFR less than 15 ml/min (HCC) 04/02/2014  . Multiple myeloma ([place]) 12/02/2013  . Major depressive disorder, recurrent episode ([place]) 11/02/2013  . Medication management 09/30/2013  . Vitamin D deficiency 03/24/2013  . Mixed hyperlipidemia 02/24/2013  . COPD (chronic obstructive pulmonary disease) with emphysema ([place]) 02/20/2013  . GERD (gastroesophageal reflux disease) 01/20/2013  . HTN (hypertension) 01/14/2013  . Asthma, chronic 01/13/2013  .  Hypothyroid 01/13/2013  . Chronic diastolic heart failure ([place]) 01/13/2013  . HX: breast cancer 12/08/2012  . Hypercalcemia 12/02/2012    Past Medical History:      Past Medical History:  Diagnosis Date  . Anemia    . Anginal pain ([place])      used NTG x 2 May 31 and 06/15/13   . Anxiety    . Arthritis    . B12 deficiency 12/04/2014  . Breast cancer ([place])    . Complication of anesthesia    . COPD (chronic obstructive pulmonary disease) ([place])    . Cryptococcal pneumonitis ([place]) 11/22/2015  . Depression    . Dizziness    . Dyspnea    . Fibromyalgia    . Fibromyalgia    . GERD (gastroesophageal reflux disease)    . Headache(784.0)    . Heart murmur    . Hemoptysis 10/21/2015  . History of blood transfusion      last one May 12   . Hx of cardiovascular stress test      LexiScan with low level exercise Myoview (02/2013): No ischemia, EF 72%; normal study  . Hx of  echocardiogram      a.  Echocardiogram (12/26/2012): EF 76-73%, grade 1 diastolic dysfunction;   b.  Echocardiogram (02/2013): EF 55-60%, no WMA, trivial effusion  . Hyperkalemia    . Hyponatremia    . Hypotension    . Hypothyroidism    . Mucositis    . Multiple myeloma    . Myocardial infarction [facility])      in past, patient was unaware.   . Neuropathy    . Nodule of left lung 09/13/2015  . Pain in joint, pelvic region and thigh 07/07/2015  . Pneumonia      several  . PONV (postoperative nausea and vomiting) 2008    after mastestomy    Past Surgical History:       Past Surgical History:  Procedure Laterality Date  . ABDOMINAL HYSTERECTOMY   1981  . AV FISTULA PLACEMENT Left 06/19/2013    Procedure: CREATION OF LEFT ARM ARTERIOVENOUS (AV) FISTULA ;  Surgeon: Angelia Mould, MD;  Location: [place];  Service: Vascular;  Laterality: Left;  . BIOPSY   03/08/2018    Procedure: BIOPSY;  Surgeon: Wilford Corner, MD;  Location: [place];  Service: Endoscopy;;  . BREAST RECONSTRUCTION      . BREAST SURGERY Right      reduction  . CATARACT EXTRACTION, BILATERAL      . CHOLECYSTECTOMY   1971  . COLONOSCOPY      . ESOPHAGOGASTRODUODENOSCOPY (EGD) WITH PROPOFOL N/A 03/08/2018    Procedure: ESOPHAGOGASTRODUODENOSCOPY (EGD) WITH PROPOFOL;  Surgeon: Wilford Corner, MD;  Location: [place];  Service: Endoscopy;  Laterality: N/A;  . EYE SURGERY Bilateral      lens implant  . history of Port removal      . IR IVC FILTER PLMT / S&I /IMG GUID/MOD SED   03/09/2018  . LUNG BIOPSY   10/21/2015  . MASTECTOMY Left 2008  . PORTACATH PLACEMENT   12/2012    has had 2  . RESECTION OF ARTERIOVENOUS FISTULA ANEURYSM Left 06/04/2017    Procedure: LIGATION ANEURYSM OF LEFT ARTERIOVENOUS FISTULA;  Surgeon: Angelia Mould, MD;  Location: [place];  Service: Vascular;  Laterality: Left;  . Status post stem cell transplant on September 28, 2008.      . TEE WITHOUT CARDIOVERSION N/A 03/07/2018     Procedure: TRANSESOPHAGEAL ECHOCARDIOGRAM (  TEE);  Surgeon: Buford Dresser, MD;  Location: [facility] ENDOSCOPY;  Service: Cardiovascular;  Laterality: N/A;    Social History:  reports that she quit smoking about 13 years ago. Her smoking use included cigarettes. She has a 30.00 pack-year smoking history. She has never used smokeless tobacco. She reports that she does not drink alcohol or use drugs.   Family / Support Systems Marital Status: Single Patient Roles: Other (Comment), Parent Children: [name] [phone] Other Supports: [name] [phone] Anticipated Caregiver: Self Ability/Limitations of Caregiver: [name] works and [name] is a Quarry manager but can come by and check on her along with her daughter coming in the evenings Caregiver Availability: Intermittent Family Dynamics: Close with her daughter who is local and works for [employer]. She has friends who are supportive and a close friend-Sheila who will check on her and is never far away.    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    Abuse/Neglect Abuse/Neglect Assessment Can Be Completed: Yes Physical Abuse: Denies Verbal Abuse: Denies Sexual Abuse: Denies Exploitation of patient/patient's resources: Denies Self-Neglect: Denies   Emotional Status [name] affect, behavior and adjustment status: Pt is motivated to do well here and feels she is recovering, her mian issues are high level, spelling and how to work her phone, which is new. She takes more time due to she fatigues more easily and has been like this with her cancern diagnosis.  She has always been one who care for herself and wants  to be able to do this at discharge from ehre. Recent Psychosocial Issues: cancer treatments and follow up appointments Psychiatric History: History of anxiety takes medications for this and finds it helpful. She would benefit from seeing neuro-psych while here if here long enough. She is doing well and recovering from her stroke. Substance Abuse History: No issues   Patient / Family Perceptions, Expectations & Goals Pt/Family understanding of illness & functional limitations: Pt does talk with her many MD's and feels she has a good understanding of her condition and treatment going forward. SHe knows she will have a OP appointment with Dr. Rogue Jury to discuss options for treamtent with this new mass found. Premorbid pt/family roles/activities: Mom, friend, retiree, church member, etc Anticipated changes in roles/activities/participation: resume Pt/family expectations/goals: Pt states: " I want to be able to take care of myself like I was doing before this."  Daughter states: " I hope she does well but she pushes herself hard and over does it at times."   [employer]: Other (Comment)(Cancer Center pt) Premorbid Home Care/DME Agencies: Other (Comment)(has rw, tub seat, rollator) Transportation available at discharge: Daughter, [facility] and cancer center trasnportation Resource referrals recommended: Neuropsychology, Support group (specify)   Discharge Planning Living Arrangements: Alone Support Systems: Children, Friends/neighbors, Church/faith community Type of Residence: Private residence Insurance Resources: [employer], Multimedia programmer (specify)(BCBS) Financial Screen Referred: No Living Expenses: Rent Money Management: Patient Does the patient have any problems obtaining your medications?: No Home Management: Self Patient/Family Preliminary Plans: Return to her apartment and have her daughter and friend check on her intermittently. She is high level and moving well  she does have some high level thought processing and using her iphone difficulties, which is new. Sw Barriers to Discharge: Decreased  caregiver support, Medical stability Sw Barriers to Discharge Comments: Does not ahve 24 hr care Social Work Anticipated Follow Up Needs: HH/OP, Support Group   Clinical Impression Very pleasant female who is motivated and at times pushes herself too hard. Her daughter and friend are involved but can not provide 24 hr care. So she will need to be mod/i to be able to return home alone safely. Will see if here long enough to see neuro-psych she has a lot going on with her health, would benefit from seeing while here. Daughter to take 2 weeks off to assist pt at discharge.    Nancy Norris 03/12/2018, 12:52 PM

## 2018-03-17 NOTE — Progress Notes (Signed)
[facility] PHYSICAL MEDICINE & REHABILITATION PROGRESS NOTE   Subjective/Complaints:  Episode of epigastric pain last noc relieved by GI cocktail an dtramadol, requesting BR assist for BM this am  Per RN trouble with swallowing pills this am  ROS- + constipation , no bladder issues, +DOE during ADLs  Objective:   No results found. Recent Labs    03/15/18 0448 03/17/18 0537  WBC 4.9 5.2  HGB 8.2* 8.9*  HCT 25.7* 27.3*  PLT 67* 52*   Recent Labs    03/15/18 0448 03/17/18 0537  NA 141 139  K 3.5 3.2*  CL 111 106  CO2 19* 22  GLUCOSE 85 109*  BUN 17 12  CREATININE 2.29* 2.16*  CALCIUM 8.6* 8.2*    Intake/Output Summary (Last 24 hours) at 03/17/2018 3790 Last data filed at 03/16/2018 1849 Gross per 24 hour  Intake 300 ml  Output -  Net 300 ml     Physical Exam: Vital Signs Blood pressure (!) 151/81, pulse 72, temperature 98.1 F (36.7 C), temperature source Oral, resp. rate 16, height '5\' 5"'$  (1.651 m), weight 79.2 kg, SpO2 99 %.   General: No acute distress Mood and affect are appropriate Heart: Regular rate and rhythm no rubs murmurs or extra sounds Lungs: Clear to auscultation, breathing unlabored, no rales or wheezes Abdomen: Positive bowel sounds, soft nontender to palpation, nondistended Extremities: No clubbing, cyanosis, or edema Skin: No evidence of breakdown, no evidence of rash Neurologic: Cranial nerves II through XII intact, motor strength is 5/5 in bilateral deltoid, bicep, tricep, grip, hip flexor, knee extensors, ankle dorsiflexor and plantar flexor Sensory exam normal sensation to light touch and proprioception in bilateral upper and lower extremities Cerebellar exam normal finger to nose to finger as well as heel to shin in bilateral upper and lower extremities Musculoskeletal: Full range of motion in all 4 extremities. No joint swelling   Assessment/Plan: 1. Functional deficits secondary to Left MCA infarct which require 3+ hours per day of  interdisciplinary therapy in a comprehensive inpatient rehab setting.  Physiatrist is providing close team supervision and 24 hour management of active medical problems listed below.  Physiatrist and rehab team continue to assess barriers to discharge/monitor patient progress toward functional and medical goals  Care Tool:  Bathing    Body parts bathed by patient: Right arm, Left arm, Chest, Abdomen, Front perineal area, Buttocks, Right upper leg, Left upper leg, Face, Right lower leg, Left lower leg         Bathing assist Assist Level: Minimal Assistance - Patient > 75%(for standing balance)     Upper Body Dressing/Undressing Upper body dressing   What is the patient wearing?: Pull over shirt    Upper body assist Assist Level: Supervision/Verbal cueing    Lower Body Dressing/Undressing Lower body dressing      What is the patient wearing?: Underwear/pull up, Pants     Lower body assist Assist for lower body dressing: Minimal Assistance - Patient > 75%(standing balance)     Toileting Toileting Toileting Activity did not occur (Clothing management and hygiene only): N/A (no void or bm)  Toileting assist Assist for toileting: Minimal Assistance - Patient > 75% Assistive Device Comment: (Front wheel walker)   Transfers Chair/bed transfer  Transfers assist     Chair/bed transfer assist level: Minimal Assistance - Patient > 75%     Locomotion Ambulation   Ambulation assist      Assist level: Moderate Assistance - Patient 50 - 74% Assistive device: Walker-rolling  Max distance: 2'   Walk 10 feet activity   Assist  Walk 10 feet activity did not occur: Refused(due to pain)        Walk 50 feet activity   Assist Walk 50 feet with 2 turns activity did not occur: Refused(due to pain)         Walk 150 feet activity   Assist Walk 150 feet activity did not occur: Refused(due to pain)         Walk 10 feet on uneven surface  activity   Assist  Walk 10 feet on uneven surfaces activity did not occur: Refused         Wheelchair     Assist Will patient use wheelchair at discharge?: No      Wheelchair assist level: Dependent - Patient 0%      Wheelchair 50 feet with 2 turns activity    Assist    Wheelchair 50 feet with 2 turns activity did not occur: N/A       Wheelchair 150 feet activity     Assist Wheelchair 150 feet activity did not occur: N/A        Medical Problem List and Plan: 1.Confusion/altered mental status with gait disorder and speech difficultysecondary to left MCA infarction due to left M2 high-grade stenosis status post revascularizationembolic pattern likely secondary to hypercoagulable state PT, OT, SLP re evals 2. Antithrombotics: -DVT/anticoagulation:SCDs.+ DOAC -antiplatelet therapy: ELIQUIS 3. Pain Management:Tylenol as needed Epigastric pain likely due to gastric adeno carcinoma again yesterday no recurrent episodes, relieved with GI cocktail a 4. Mood:Wellbutrin 75 mg twice a day, Celexa 40 mg daily. Provide emotional support -antipsychotic agents: None 5. Neuropsych: This patientiscapable of making decisions on herown behalf. 6. Skin/Wound Care:Routine skin checks 7. Fluids/Electrolytes/Nutrition:Routine ins and outs with follow-up chemistries 8. Poorly differentiated adenocarcinoma the stomach as well as history of multiple myeloma and remote breast cancer. Patient was to follow up outpatient with oncology services Dr. Earlie Server 9. COPD with remote tobacco use. Continue nebulizer treatments as directed 10. Chronic anemia/thrombocytopenia. Patient follow-up outpatient with hematology oncology. Transfuse as needed 11. Orthostatic hypotension. ProAmatine 10 mg daily 12. CKD stage IV. Creatinine baseline 2.29-2.93. Follow-up chemistries 13. Hypothyroidism. Synthroid 14. GERD. Protonix 15. Hyperlipidemia. Lipitor  16.  Constipation- add  senna, feels the need for BM this am LOS: 2 days A FACE TO FACE EVALUATION WAS PERFORMED  Charlett Blake 03/17/2018, 8:23 AM

## 2018-03-18 ENCOUNTER — Inpatient Hospital Stay (HOSPITAL_COMMUNITY): Payer: Medicare Other

## 2018-03-18 ENCOUNTER — Inpatient Hospital Stay (HOSPITAL_COMMUNITY): Payer: Medicare Other | Admitting: Physical Therapy

## 2018-03-18 ENCOUNTER — Inpatient Hospital Stay (HOSPITAL_COMMUNITY): Payer: Medicare Other | Admitting: Occupational Therapy

## 2018-03-18 ENCOUNTER — Encounter (HOSPITAL_COMMUNITY): Payer: Self-pay

## 2018-03-18 MED ORDER — ALUM & MAG HYDROXIDE-SIMETH 200-200-20 MG/5ML PO SUSP
15.0000 mL | ORAL | Status: DC | PRN
  Administered 2018-03-18 – 2018-03-23 (×4): 15 mL via ORAL
  Filled 2018-03-18 (×5): qty 30

## 2018-03-18 MED ORDER — GABAPENTIN 100 MG PO CAPS
100.0000 mg | ORAL_CAPSULE | Freq: Two times a day (BID) | ORAL | Status: DC
  Administered 2018-03-18 – 2018-03-20 (×5): 100 mg via ORAL
  Filled 2018-03-18 (×6): qty 1

## 2018-03-18 NOTE — Progress Notes (Signed)
Occupational Therapy Session Note  Patient Details  Name: Nancy Norris MRN: [id number] Date of Birth: [birth year]  Today's Date: 03/18/2018 OT Individual Time: 1303-1400 OT Individual Time Calculation (min): 57 min    Short Term Goals: Week 1:  OT Short Term Goal 1 (Week 1): STGs=LTGs due to ELOS  Skilled Therapeutic Interventions/Progress Updates:    Treatment session with focus on activity tolerance and functional use of BUE.  Pt received semi-reclined in bed declining bathing/dressing at this time but willing to get OOB.  Engaged in therapeutic activity with focus on use of BUE and RUE.  Noted increased shakiness with movements but ability to utilize RUE as nearly dominant during table top tasks.  Completed 9 hole peg test - Rt: 1:10 and Lt: 54 seconds and Box and blocks assessment - Lt: 23 and Rt: 18 blocks.  Pt demonstrating decreased visual scanning to Rt environment during box and blocks assessment.  Lunch tray arrived during session, therefore engaged in self-feeding with focus on opening containers and self-feeding with pt able to utilize BUE when eating sandwich and RUE for eating soup.  Engaged in handwriting task with pt reporting increased difficulty with memory in forming letters and with inability to recall address when writing but able to tell address to therapist.  Pt also reports handwriting "is not mine".  Pt left upright in w/c with RN aware of positioning and coming in to assess pt.  Therapy Documentation Precautions:  Precautions Precautions: Fall Precaution Comments: Portacath + fistula Lt forearm  Restrictions Weight Bearing Restrictions: No Pain: Pain Assessment Pain Score: 0-No pain   Therapy/Group: Individual Therapy  [name] 03/18/2018, 3:38 PM

## 2018-03-18 NOTE — Progress Notes (Signed)
Speech Language Pathology Daily Session Note  Patient Details  Name: JOEI FRANGOS MRN: [id number] Date of Birth: [birth year]  Today's Date: 03/18/2018 SLP Individual Time: 0900-1000 SLP Individual Time Calculation (min): 60 min  Short Term Goals: Week 1: SLP Short Term Goal 1 (Week 1): Pt will complete functional basic problem solving tasks with Min A. SLP Short Term Goal 2 (Week 1): Pt will utilize word finding strategies to produce sentence length utterances with Mod A cues in 8 out of 10 opportunities.  SLP Short Term Goal 3 (Week 1): Pt will demonstrate selective attention for ~ 30 minutes in mildy distracting environment with Min A cues.   Skilled Therapeutic Interventions:Skilled ST services focused on cognitive skills. SLP facilitated basic problem solving skills utilizing 3-4 step card sequence task, pt required supervision A verbal cues for 3 step and min A verbal cues for 4 step sequence cards. SLP facilitated word finding skills, initially providing instruction for strategies ( describing, categorization, visualization) and implemented use during picture description tasks, pt required min A increase to mod A verbal cues as the level of complexity increase in the pictures. SLP facilitated basic problem solving counting change, pt required max A verbal cues, suggest due to impairments in working memory and organization, SLP provided structred assistance with written cues. Pt was overall impacted by delayed processing, requiring extra time on all above tasks. Pharmacies interrupted session to provided edcuation of new medication, Eliquis, pt demonstrated recall and comprehension of mildly complex information, given question prompts by SLP . Pt was left in room with call bell within reach and bed alarm set. SLP reccomends to continue skilled services.     Pain Pain Assessment Pain Scale: 0-10 Pain Score: 0-No pain  Therapy/Group: Individual Therapy  Terren Haberle  [facility] 03/18/2018, 12:22  PM

## 2018-03-18 NOTE — Progress Notes (Signed)
[facility] PHYSICAL MEDICINE & REHABILITATION PROGRESS NOTE   Subjective/Complaints:    Per RN trouble with swallowing pills this am  ROS- + constipation , no bladder issues, +DOE during ADLs  Objective:   No results found. Recent Labs    03/17/18 0537  WBC 5.2  HGB 8.9*  HCT 27.3*  PLT 52*   Recent Labs    03/17/18 0537  NA 139  K 3.2*  CL 106  CO2 22  GLUCOSE 109*  BUN 12  CREATININE 2.16*  CALCIUM 8.2*   No intake or output data in the 24 hours ending 03/18/18 0814   Physical Exam: Vital Signs Blood pressure 130/69, pulse 68, temperature 98.4 F (36.9 C), temperature source Oral, resp. rate 18, height '5\' 5"'$  (1.651 m), weight 79.2 kg, SpO2 98 %.   General: No acute distress Mood and affect are appropriate Heart: Regular rate and rhythm no rubs murmurs or extra sounds Lungs: Clear to auscultation, breathing unlabored, no rales or wheezes Abdomen: Positive bowel sounds, soft nontender to palpation, nondistended Extremities: No clubbing, cyanosis, or edema Skin: No evidence of breakdown, no evidence of rash Neurologic: Cranial nerves II through XII intact, motor strength is 5/5 in bilateral deltoid, bicep, tricep, grip, hip flexor, knee extensors, ankle dorsiflexor and plantar flexor Sensory exam normal sensation to light touch and proprioception in bilateral upper and lower extremities Cerebellar exam normal finger to nose to finger as well as heel to shin in bilateral upper and lower extremities Musculoskeletal: Full range of motion in all 4 extremities. No joint swelling   Assessment/Plan: 1. Functional deficits secondary to Left MCA infarct which require 3+ hours per day of interdisciplinary therapy in a comprehensive inpatient rehab setting.  Physiatrist is providing close team supervision and 24 hour management of active medical problems listed below.  Physiatrist and rehab team continue to assess barriers to discharge/monitor patient progress toward  functional and medical goals  Care Tool:  Bathing    Body parts bathed by patient: Right arm, Left arm, Chest, Abdomen, Front perineal area, Buttocks, Right upper leg, Left upper leg, Face, Right lower leg, Left lower leg         Bathing assist Assist Level: Minimal Assistance - Patient > 75%(for standing balance)     Upper Body Dressing/Undressing Upper body dressing   What is the patient wearing?: Pull over shirt    Upper body assist Assist Level: Supervision/Verbal cueing    Lower Body Dressing/Undressing Lower body dressing      What is the patient wearing?: Underwear/pull up, Pants     Lower body assist Assist for lower body dressing: Minimal Assistance - Patient > 75%(standing balance)     Toileting Toileting Toileting Activity did not occur (Clothing management and hygiene only): N/A (no void or bm)  Toileting assist Assist for toileting: Minimal Assistance - Patient > 75% Assistive Device Comment: (Front wheel walker)   Transfers Chair/bed transfer  Transfers assist     Chair/bed transfer assist level: Minimal Assistance - Patient > 75%     Locomotion Ambulation   Ambulation assist      Assist level: Moderate Assistance - Patient 50 - 74% Assistive device: Walker-rolling Max distance: 2'   Walk 10 feet activity   Assist  Walk 10 feet activity did not occur: Refused(due to pain)        Walk 50 feet activity   Assist Walk 50 feet with 2 turns activity did not occur: Refused(due to pain)  Walk 150 feet activity   Assist Walk 150 feet activity did not occur: Refused(due to pain)         Walk 10 feet on uneven surface  activity   Assist Walk 10 feet on uneven surfaces activity did not occur: Refused         Wheelchair     Assist Will patient use wheelchair at discharge?: No      Wheelchair assist level: Dependent - Patient 0%      Wheelchair 50 feet with 2 turns activity    Assist    Wheelchair  50 feet with 2 turns activity did not occur: N/A       Wheelchair 150 feet activity     Assist Wheelchair 150 feet activity did not occur: N/A        Medical Problem List and Plan: 1.Confusion/altered mental status with gait disorder and speech difficultysecondary to left MCA infarction due to left M2 high-grade stenosis status post revascularizationembolic pattern likely secondary to hypercoagulable state PT, OT, SLP team conf in amn 2. Antithrombotics: -DVT/anticoagulation:SCDs.+ DOAC -antiplatelet therapy: ELIQUIS 3. Pain Management:Tylenol as needed Epigastric pain likely due to gastric adeno carcinoma again yesterday no recurrent episodes, relieved with GI cocktail a 4. Mood:Wellbutrin 75 mg twice a day, Celexa 40 mg daily. Provide emotional support -antipsychotic agents: None States she took something at home for anxiety, already on Wellbutrin and Celexa, add gabapentin 5. Neuropsych: This patientiscapable of making decisions on herown behalf. 6. Skin/Wound Care:Routine skin checks 7. Fluids/Electrolytes/Nutrition:Routine ins and outs with follow-up chemistries 8. Poorly differentiated adenocarcinoma the stomach as well as history of multiple myeloma and remote breast cancer. Patient was to follow up outpatient with oncology services Dr. Earlie Server 9. COPD with remote tobacco use. Continue nebulizer treatments as directed 10. Chronic anemia/thrombocytopenia. Patient follow-up outpatient with hematology oncology. Transfuse as needed 11. Orthostatic hypotension. ProAmatine 10 mg daily 12. CKD stage IV. Creatinine baseline 2.29-2.93. Follow-up chemistries 13. Hypothyroidism. Synthroid 14. GERD. Protonix 15. Hyperlipidemia. Lipitor  16.  Constipation- add senna, feels the need for BM this am 17.  HypoK KCL 75mq BID started 3/2 LOS: 3 days A FACE TO FACE EVALUATION WAS PERFORMED  ACharlett Blake3/03/2018, 8:14 AM

## 2018-03-18 NOTE — Progress Notes (Addendum)
Physical Therapy Session Note  Patient Details  Name: Nancy Norris MRN: [id number] Date of Birth: 1940/12/04  Today's Date: 03/18/2018 PT Individual Time: 7591-6384 PT Individual Time Calculation (min): 28 min   Short Term Goals: Week 1:  PT Short Term Goal 1 (Week 1): = LTGs due to ELOS (supervision overall)  Skilled Therapeutic Interventions/Progress Updates:  Pt received in bed & agreeable to tx, no c/o pain reported. Pt transfers supine<>sitting EOB with supervision and hospital bed features. Pt completes stand pivot bed<>w/c with min assist. Transported pt to/from gym via w/c dependent assist and provided pt with appropriate height walker for improved posture & positioning. Pt transfers sit>stand with CGA and ambulates 65 ft before appearing to feel unwell & requesting to sit down 2/2 feeling "woozy". Pt assisted to mat and lying down propped up on wedge with pillow. Vitals assessed, see below. Pt requesting to return to bed and assisted back to bed. RN made aware of pt's complaints & pt left in bed in care of RN.   Pt reports "woozy, dizzy" feelings do not completely cease.  Therapy Documentation Precautions:  Precautions Precautions: Fall Precaution Comments: Portacath + fistula Lt forearm  Restrictions Weight Bearing Restrictions: No   General: PT Amount of Missed Time (min): 32 Minutes PT Missed Treatment Reason: Patient ill (Comment)   Vital Signs: Therapy Vitals Pulse Rate: 73 BP: (!) 142/88 Patient Position (if appropriate): Lying    Therapy/Group: Individual Therapy  [name] 03/18/2018, 11:48 AM

## 2018-03-18 NOTE — IPOC Note (Signed)
Overall Plan of Care [facility]) Patient Details Name: Nancy Norris MRN: [id number] DOB: [birth year]  Admitting Diagnosis: <principal problem not specified>  Hospital Problems: Active Problems:   Left middle cerebral artery stroke [facility])     Functional Problem List: Nursing Edema, Safety, Endurance, Medication Management, Skin Integrity  PT Balance, Endurance, Motor, Pain, Safety, Sensory  OT Balance, Cognition, Edema, Endurance, Sensory, Motor, Safety(pitting edema B feet)  SLP Cognition, Linguistic  TR         Basic ADL's: OT Grooming, Bathing, Dressing, Toileting     Advanced  ADL's: OT Simple Meal Preparation     Transfers: PT Bed Mobility, Bed to Chair, Car, Furniture, Floor  OT Toilet, Metallurgist: PT Ambulation, Stairs     Additional Impairments: OT    SLP Social Cognition   Social Interaction, Problem Solving, Memory, Awareness, Attention  TR      Anticipated Outcomes Item Anticipated Outcome  Self Feeding    Swallowing      Basic self-care  Supervision/cuing   Toileting  Supervision/cuing    Bathroom Transfers Supervision/cuing   Bowel/Bladder  n/a  Transfers  supervision overall  Locomotion  supervision gait  Communication  Min A  Cognition  Min A  Pain  n/a  Safety/Judgment  cues/reminders assist   Therapy Plan: PT Intensity: Minimum of 1-2 x/day ,45 to 90 minutes PT Frequency: 5 out of 7 days PT Duration Estimated Length of Stay: 6-10 days OT Intensity: Minimum of 1-2 x/day, 45 to 90 minutes OT Frequency: 5 out of 7 days OT Duration/Estimated Length of Stay: 7-10 days SLP Intensity: Minumum of 1-2 x/day, 30 to 90 minutes SLP Frequency: 3 to 5 out of 7 days SLP Duration/Estimated Length of Stay: 7 to 10 days    Team Interventions: Nursing Interventions Patient/Family Education, Skin Care/Wound Management, Disease Management/Prevention, Medication Management, Discharge Planning  PT interventions Ambulation/gait  training, Balance/vestibular training, Cognitive remediation/compensation, Community reintegration, Discharge planning, DME/adaptive equipment instruction, Disease management/prevention, Functional mobility training, Neuromuscular re-education, Pain management, Patient/family education, Psychosocial support, Skin care/wound management, Stair training, Therapeutic Activities, Therapeutic Exercise, UE/LE Strength taining/ROM, UE/LE Coordination activities, Wheelchair propulsion/positioning  OT Interventions Training and development officer, Cognitive remediation/compensation, Academic librarian, Discharge planning, Disease mangement/prevention, Engineer, drilling, Functional mobility training, Neuromuscular re-education, Pain management, Patient/family education, Psychosocial support, Self Care/advanced ADL retraining, Skin care/wound managment, Splinting/orthotics, Therapeutic Activities, Therapeutic Exercise, UE/LE Strength taining/ROM, UE/LE Coordination activities, Visual/perceptual remediation/compensation  SLP Interventions Cognitive remediation/compensation, Internal/external aids, Functional tasks, Cueing hierarchy, Patient/family education, Other (comment)  TR Interventions    SW/CM Interventions Discharge Planning, Psychosocial Support, Patient/Family Education   Barriers to Discharge MD  Medical stability  Nursing      PT Decreased caregiver support, Medical stability recommending supervision  OT Decreased caregiver support, Pending chemo/radiation(f/u oncology services)    SLP      SW       Team Discharge Planning: Destination: PT-Home ,OT- Home , SLP-Home Projected Follow-up: PT-Home health PT, OT-  Home health OT, SLP-24 hour supervision/assistance, Home Health SLP Projected Equipment Needs: PT-To be determined, OT- To be determined, SLP-None recommended by SLP Equipment Details: PT- , OT-  Patient/family involved in discharge planning: PT- Patient, Family  member/caregiver,  OT-Patient, SLP-Patient  MD ELOS: 7-10d Medical Rehab Prognosis:  Good Assessment:  78 year old right-handed female she does have a history of multiple myeloma in remission followed by Dr. Earlie Server, remote breast cancer as well as COPD with remote tobacco abuse 13 years ago, medical history also complicated by chronic  anemia with thrombocytopenia, fibromyalgia as well as CK D stage IV with creatinine 2.29-2.93 andhypotension maintained on ProAmatine. Per chart review she lives alone and was independent prior to admission she has a daughter in the area. One level home with ramped entrance. Patient initially presented 03/03/2018 with altered mental status difficulty swallowing as well as headache 2 weeks. Lactic acid 1.2, mildly elevated ammonia level. She was seen in the ED on 02/26/2018 for episodes of dizziness as well as headache and fall. CT of the head was negative. She had a hemoglobin of 7.9 with noted history of chronic anemia 8 urine culture showed greater than 100,000 Escherichia coli she was discharged to home after being placed on Keflex. On this latest admission cranial CT scan repeated showing 2 separate areas of abnormal cortical and subcortical edema in the left hemisphere affecting the left parietal lobe occipital lobes that were not present on 02/26/2018. MRI the brain showed left greater than right small to moderate size multifocal infarctions. Per report, acute ischemia within multi vascular territories in both hemispheres. MRA was unremarkable. Patient did not receive TPA. Her echocardiogram showed ejection fraction of 74% normal systolic function. Carotid Dopplers without ICA stenosis. Blood cultures positive for staph species and Proteus she was placed on antibiotic therapy with vancomycin and ceftriaxone 03/06/2018. A swallow study completed as well as esophagram 03/06/2018 showed nonspecific esophageal dysmotility no appreciable upper gastric diverticulum and an  initially on a regular diet. TEE completed 03/07/2018 with ejection fraction of 65% and no thrombus or vegetation. Small PFO seen by color Doppler. She did remain on aspirin for CVA prophylaxis. Gastroenterology services consulted for evaluation of noted abnormal barium swallow question GI bleed with noted history of anemia. An upper GI endoscopy completed 03/08/2020 per Dr. Michail Sermon showed a large fungating malignant appearing ulcerated proximal stomach mass that had areas of blood seen consistent with recent bleeding and and biopsies obtained. Mass likely causing partial obstruction which was contributing to her dysphagia. Recommendations were for CT of abdomen and pelvis completed showing marked wall thickening in the esophagogastric junction and proximal stomach consistent with known neoplasm. Bulky lymphadenopathy in the gastrohepatic ligament compatible with metastatic disease. There was a 2.7 cm hypoattenuating lesion in the spleen as well as scattered tiny pulmonary nodules measuring up to 5 mm and mild lymphadenopathy noted in the upper mediastinum 1.9 x 1.2 cm irregular lesion posterior left upper lobe at the site of a 4 cm mass that was also noted on PET scan 10/11/2015 features compatible with residual of that lesion. Oncology services Dr. Earlie Server follow-up in regards to findings pathology report remain pending current plan was to await biopsy and make recommendations about management of suspicious malignancy. Hospital course complicated by findings of acute DVT left posterior tibial vein and left peroneal vein radiology services consulted 03/09/2018 underwent IVC filter placement by Dr. Barbie Banner of interventional radiology. On 03/10/2018 cardiology again follow-up for nonspecific chest discomfort dizziness as well as elevated troponin 2.15-2.3. EKG did show new Q waves on her ECG in the inferior leads. Repeat echocardiogram showing ejection fraction 65%. The right ventricle normal systolic function no  increase in right ventricular wall thickness. Therapy evaluations completed and patient was admitted to inpatient rehabilitation services 03/11/2018. On the afternoon of 03/12/2018 patient became more confused and garbled speech/expressive aphasia and bilateral upper extremity increased weakness as well as increased tone. Neurology service was consulted stat cranial CT scan obtained that showed evolving subacute infarctions without evidence of new intracranial abnormality. Patient was discharged  to acute care services 03/12/2018 for further evaluation. CT angiogram of head and neck showed high-grade stenosis versus nonocclusive thrombus involving the proximal left superior M2 division. Interventional radiology consulted and underwent complete revascularization of occluded left MCA distal M1 occlusion.Ffter discussing with hematology oncology services in regards to left MCA infarction likely related to hypercoagulable state plan was to begin [facility] in place of aspirin therapy and watch for any bleeding tendencies.Her diet remains a regular consistency. Pathology report of gastric mass returned poorly differentiated adenocarcinoma of the stomach and follow-up oncology services and plan to follow-up as outpatient. Therapy evaluations again have been completed after latest event and recommendations were to return to inpatient rehabilitation services for comprehensive therapies.   Now requiring 24/7 Rehab RN,MD, as well as CIR level PT, OT and SLP.  Treatment team will focus on ADLs and mobility with goals set at Min/Sup   See Team Conference Notes for weekly updates to the plan of care

## 2018-03-18 NOTE — Plan of Care (Signed)
  Problem: Consults Goal: RH STROKE PATIENT EDUCATION Description See Patient Education module for education specifics  Outcome: Progressing   Problem: RH SKIN INTEGRITY Goal: RH STG SKIN FREE OF INFECTION/BREAKDOWN Description With min assist  Outcome: Progressing Goal: RH STG MAINTAIN SKIN INTEGRITY WITH ASSISTANCE Description STG Maintain Skin Integrity With min Assistance.  Outcome: Progressing   Problem: RH SAFETY Goal: RH STG ADHERE TO SAFETY PRECAUTIONS W/ASSISTANCE/DEVICE Description STG Adhere to Safety Precautions With reminders Assistance/Device.  Outcome: Progressing Flowsheets (Taken 03/18/2018 1544) STG:Pt will adhere to safety precautions with assistance/device: 5-Supervision/cueing   Problem: RH KNOWLEDGE DEFICIT Goal: RH STG INCREASE KNOWLEGDE OF HYPERLIPIDEMIA Description Pt will be able to state management of HLD using medications and dietary restrictions using handouts and resources independently  Outcome: Progressing Goal: RH STG INCREASE KNOWLEDGE OF STROKE PROPHYLAXIS Description Pt will be able to state management of secondary stroke risk factors using medications and dietary restrictions using handouts/resources independently  Outcome: Progressing

## 2018-03-18 NOTE — Discharge Instructions (Signed)
Inpatient Rehab Discharge Instructions  Nancy Norris Discharge date and time: No discharge date for patient encounter.   Activities/Precautions/ Functional Status: Activity: activity as tolerated Diet: regular diet Wound Care: none needed Functional status:  ___ No restrictions     ___ Walk up steps independently ___ 24/7 supervision/assistance   ___ Walk up steps with assistance ___ Intermittent supervision/assistance  ___ Bathe/dress independently ___ Walk with walker     _x__ Bathe/dress with assistance ___ Walk Independently    ___ Shower independently ___ Walk with assistance    ___ Shower with assistance ___ No alcohol     ___ Return to work/school ________  Special Instructions: Follow-up with Dr. Earlie Server of hematology oncology services for latest biopsy results and plan of care   My questions have been answered and I understand these instructions. I will adhere to these goals and the provided educational materials after my discharge from the hospital.  Patient/Caregiver Signature _______________________________ Date __________  Clinician Signature _______________________________________ Date __________  Please bring this form and your medication list with you to all your follow-up doctor's appointments.   Information on my medicine - ELIQUIS (apixaban)  This medication education was reviewed with me or my healthcare representative as part of my discharge preparation.  The pharmacist that spoke with me during my hospital stay was:  Onnie Boer, RPH-CPP  Why was Eliquis prescribed for you? Eliquis was prescribed for you to reduce the risk of a blood clot forming that can cause a stroke if you have a medical condition called atrial fibrillation (a type of irregular heartbeat).  What do You need to know about Eliquis ? Take your Eliquis TWICE DAILY - one tablet in the morning and one tablet in the evening with or without food. If you have difficulty swallowing the  tablet whole please discuss with your pharmacist how to take the medication safely.  Take Eliquis exactly as prescribed by your doctor and DO NOT stop taking Eliquis without talking to the doctor who prescribed the medication.  Stopping may increase your risk of developing a stroke.  Refill your prescription before you run out.  After discharge, you should have regular check-up appointments with your healthcare provider that is prescribing your Eliquis.  In the future your dose may need to be changed if your kidney function or weight changes by a significant amount or as you get older.  What do you do if you miss a dose? If you miss a dose, take it as soon as you remember on the same day and resume taking twice daily.  Do not take more than one dose of ELIQUIS at the same time to make up a missed dose.  Important Safety Information A possible side effect of Eliquis is bleeding. You should call your healthcare provider right away if you experience any of the following: ? Bleeding from an injury or your nose that does not stop. ? Unusual colored urine (red or dark brown) or unusual colored stools (red or black). ? Unusual bruising for unknown reasons. ? A serious fall or if you hit your head (even if there is no bleeding).  Some medicines may interact with Eliquis and might increase your risk of bleeding or clotting while on Eliquis. To help avoid this, consult your healthcare provider or pharmacist prior to using any new prescription or non-prescription medications, including herbals, vitamins, non-steroidal anti-inflammatory drugs (NSAIDs) and supplements.  This website has more information on Eliquis (apixaban): http://www.eliquis.com/eliquis/home

## 2018-03-18 NOTE — Progress Notes (Signed)
ANTICOAGULATION CONSULT NOTE - Follow Up Consult  Pharmacy Consult for Eliquis Indication: atrial fibrillation       Allergies  Allergen Reactions  . Codeine Anaphylaxis    anaphylasix tolarates oxycodone per care cast  . Latex Shortness Of Breath  . Chocolate     migraines  . Onion Other (See Comments)    Causes migraine headaches  . Zyprexa [Olanzapine] Other (See Comments)    Confusion , dizzy,unsteady  . Adhesive [Tape] Other (See Comments)    blisters  . Heparin Rash    Can flush port with heparin  . Hydrocodone Rash    With extreme itching  . Iodinated Diagnostic Agents Hives, Itching, Rash and Other (See Comments)    Happened 60 years ago Stage IV kidney function  . Oxycodone Rash  . Promethazine-Dm Hives  . Sulfa Antibiotics Itching    Vital Signs: Temp: 98 F (36.7 C) (02/28 1100) Temp Source: Oral (02/28 1100) BP: 131/74 (02/28 1400) Pulse Rate: 64 (02/28 1400)  Labs: RecentLabs(last2labs)        Recent Labs    03/12/18 0820 03/13/18 1000 03/14/18 0202 03/14/18 1048  HGB 9.6* 7.1* 8.5*  --   HCT 30.9* 22.7* 27.0*  --   PLT 58* 85* 78*  --   CREATININE 2.39* 2.25* 2.35*  --   TROPONINI  --   --   --  0.21*      Estimated Creatinine Clearance: 21.3 mL/min (A) (by C-G formula based on SCr of 2.35 mg/dL (H)).   Assessment: 78 year old female on Eliquis for Afib. H/H stable, plts remain low.  Filter in place for confirmed DVT  Goal of Therapy:  Monitor platelets by anticoagulation protocol: Yes   Plan:  Eliquis 5 mg po BID Bmet/CBC q72h  Onnie Boer, PharmD, [place], AAHIVP, CPP Infectious Disease Pharmacist 03/18/2018 9:32 AM

## 2018-03-19 ENCOUNTER — Encounter (HOSPITAL_COMMUNITY): Payer: Medicare Other | Admitting: Psychology

## 2018-03-19 ENCOUNTER — Inpatient Hospital Stay (HOSPITAL_COMMUNITY): Payer: Medicare Other | Admitting: Speech Pathology

## 2018-03-19 ENCOUNTER — Inpatient Hospital Stay: Payer: Medicare Other

## 2018-03-19 ENCOUNTER — Inpatient Hospital Stay (HOSPITAL_COMMUNITY): Payer: Medicare Other | Admitting: Occupational Therapy

## 2018-03-19 ENCOUNTER — Encounter: Payer: Self-pay | Admitting: Adult Health

## 2018-03-19 ENCOUNTER — Inpatient Hospital Stay: Payer: Medicare Other | Admitting: Internal Medicine

## 2018-03-19 ENCOUNTER — Other Ambulatory Visit: Payer: Medicare Other

## 2018-03-19 ENCOUNTER — Inpatient Hospital Stay (HOSPITAL_COMMUNITY): Payer: Medicare Other | Admitting: Physical Therapy

## 2018-03-19 NOTE — Patient Care Conference (Signed)
Inpatient RehabilitationTeam Conference and Plan of Care Update Date: 03/19/2018   Time: 10:55 AM    Patient Name: Nancy Norris      Medical Record Number: [id number]  Date of Birth: Oct 28, 1940 Sex: Female         Room/Bed: 4W18C/4W18C-01 Payor Info: Payor: MEDICARE / Plan: MEDICARE PART A AND B / Product Type: *No Product type* /    Admitting Diagnosis: left cva  Admit Date/Time:  03/15/2018  2:50 PM Admission Comments: No comment available   Primary Diagnosis:  <principal problem not specified> Principal Problem: <principal problem not specified>  Patient Active Problem List   Diagnosis Date Noted  . Left middle cerebral artery stroke ([place]) 03/15/2018  . Hypercoagulable state, secondary ([place]) 03/14/2018  . Middle cerebral artery embolism, left 03/12/2018  . Subcortical infarction ([place]) 03/11/2018  . Pancytopenia ([place])   . NSTEMI (non-ST elevated myocardial infarction) ([place])   . Dyslipidemia   . Hypotension   . Acute DVT (deep venous thrombosis) ([place])   . Malignant neoplasm of stomach ([place])   . Acute cardioembolic stroke ([place])   . CKD (chronic kidney disease), stage III ([place])   . Dysphagia   . CKD (chronic kidney disease), stage IV ([place])   . Stroke ([place]) 03/03/2018  . Port catheter in place 09/18/2016  . Cryptococcal pneumonitis ([place]) 11/22/2015  . Symptomatic anemia 03/31/2015  . B12 deficiency 12/04/2014  . Thrombocytopenia ([place]) 12/04/2014  . Encounter for antineoplastic chemotherapy 06/21/2014  . CKD (chronic kidney disease) stage 5, GFR less than 15 ml/min (HCC) 04/02/2014  . Multiple myeloma ([place]) 12/02/2013  . Major depressive disorder, recurrent episode ([place]) 11/02/2013  . Medication management 09/30/2013  . Vitamin D deficiency 03/24/2013  . Mixed hyperlipidemia 02/24/2013  . COPD (chronic obstructive pulmonary disease) with emphysema ([place]) 02/20/2013  . GERD (gastroesophageal reflux disease) 01/20/2013  . HTN (hypertension) 01/14/2013  . Asthma, chronic  01/13/2013  . Hypothyroid 01/13/2013  . Chronic diastolic heart failure ([place]) 01/13/2013  . HX: breast cancer 12/08/2012  . Hypercalcemia 12/02/2012    Expected Discharge Date: Expected Discharge Date: 03/26/18  Team Members Present: Physician leading conference: Dr. Alysia Penna Social Worker Present: Ovidio Kin, LCSW Nurse Present: Rayetta Pigg, RN PT Present: Lavone Nian, PT OT Present: [name], OT SLP Present: Stormy Fabian, SLP PPS Coordinator present : Gunnar Fusi     Current Status/Progress Goal Weekly Team Focus  Medical   Patient readmitted after recurrent CVA causing right hemiparesis and aphasia.  Intermittent abdominal pain due to gastric adenocarcinoma  Reduce recurrent stroke risk, medicate for abdominal pain  Reevaluate functional decline after recurrent stroke   Bowel/Bladder   continent of bowel & bladder, LBM 03/18/18  remain continent of bowel and bladder, maintain regular bowel pattern  monitor & assist as needed   Swallow/Nutrition/ Hydration   N/A  N/A      ADL's   Min assist bathing and LB dressing, supervision UB dressing, min assist transfers  Supervision  ADL retraining, dynamic standing balance, activity tolerance/endurance, pt/family education, d/c planning   Mobility   supervision bed mobility, min assist stand pivot transfers, min assist gait x 65 ft with RW, decreased activity tolerance  supervision overall with LRAD  transfers, activity tolerance/endurance, gait, bed mobility, pt education, strengthening   Communication   Max A to Mod A expressive - word finding for daily info - fluctuates with anxiety  Mod A - downgraded 03/19/18  use of word finding strategies   Safety/Cognition/ Behavioral Observations  Max  A for attention, basic problem solving, intellectual awareness  Mod A - downgraded 03/19/18  left inattention (?), basic problem solving, intellectual awareness   Pain   no c/o pain, has tylenol & tramadol prn  pain scale  <4/10  continue to asssess & treat as needed   Skin   no skin issues  no new skin break down  assess q shift      *See Care Plan and progress notes for long and short-term goals.     Barriers to Discharge  Current Status/Progress Possible Resolutions Date Resolved   Physician    Pending chemo/radiation;Medical stability     Progressing towards goals  Will need appropriate follow-up with oncology regarding gastric mass      Nursing                  PT  Decreased caregiver support;Medical stability  recommending supervision              OT Decreased caregiver support;Pending chemo/radiation(f/u oncology services)                SLP                SW                Discharge Planning/Teaching Needs:  HOme daughter to take 2 weeks off to be there with pt and assist. Neuro-psych to see for coping today      Team Discussion:  Goals supervision level. R-inattention and questionable visual deficits. Cognition/language deficits. Balance and activity tolerance issues. Will need 24 hr supervision at discharge. Pt concerned about her level of care she will need at discharge.  Revisions to Treatment Plan:  DC 3/11    Continued Need for Acute Rehabilitation Level of Care: The patient requires daily medical management by a physician with specialized training in physical medicine and rehabilitation for the following conditions: Daily direction of a multidisciplinary physical rehabilitation program to ensure safe treatment while eliciting the highest outcome that is of practical value to the patient.: Yes Daily medical management of patient stability for increased activity during participation in an intensive rehabilitation regime.: Yes Daily analysis of laboratory values and/or radiology reports with any subsequent need for medication adjustment of medical intervention for : Neurological problems   I attest that I was present, lead the team conference, and concur with the assessment and plan  of the team.   [name] 03/19/2018, 2:31 PM

## 2018-03-19 NOTE — Progress Notes (Signed)
[facility] PHYSICAL MEDICINE & REHABILITATION PROGRESS NOTE   Subjective/Complaints:   No issues overnite, pt states bowels and bladder ok   ROS- + constipation , no bladder issues, +DOE during ADLs  Objective:   No results found. Recent Labs    03/17/18 0537  WBC 5.2  HGB 8.9*  HCT 27.3*  PLT 52*   Recent Labs    03/17/18 0537  NA 139  K 3.2*  CL 106  CO2 22  GLUCOSE 109*  BUN 12  CREATININE 2.16*  CALCIUM 8.2*    Intake/Output Summary (Last 24 hours) at 03/19/2018 0838 Last data filed at 03/19/2018 0700 Gross per 24 hour  Intake 720 ml  Output -  Net 720 ml     Physical Exam: Vital Signs Blood pressure 118/67, pulse 70, temperature 98.2 F (36.8 C), temperature source Oral, resp. rate 16, height '5\' 5"'$  (1.651 m), weight 79.2 kg, SpO2 96 %.   General: No acute distress Mood and affect are appropriate Heart: Regular rate and rhythm no rubs murmurs or extra sounds Lungs: Clear to auscultation, breathing unlabored, no rales or wheezes Abdomen: Positive bowel sounds, soft nontender to palpation, nondistended Extremities: No clubbing, cyanosis, or edema Skin: No evidence of breakdown, no evidence of rash Neurologic: Cranial nerves II through XII intact, motor strength is 5/5 in bilateral deltoid, bicep, tricep, grip, hip flexor, knee extensors, ankle dorsiflexor and plantar flexor Sensory exam normal sensation to light touch and proprioception in bilateral upper and lower extremities Cerebellar exam normal finger to nose to finger as well as heel to shin in bilateral upper and lower extremities Musculoskeletal: Full range of motion in all 4 extremities. No joint swelling Aphasia mainly with reduced word finding  Assessment/Plan: 1. Functional deficits secondary to Left MCA infarct which require 3+ hours per day of interdisciplinary therapy in a comprehensive inpatient rehab setting.  Physiatrist is providing close team supervision and 24 hour management of  active medical problems listed below.  Physiatrist and rehab team continue to assess barriers to discharge/monitor patient progress toward functional and medical goals  Care Tool:  Bathing    Body parts bathed by patient: Right arm, Left arm, Chest, Abdomen, Front perineal area, Buttocks, Right upper leg, Left upper leg, Face, Right lower leg, Left lower leg         Bathing assist Assist Level: Minimal Assistance - Patient > 75%(for standing balance)     Upper Body Dressing/Undressing Upper body dressing   What is the patient wearing?: Pull over shirt    Upper body assist Assist Level: Supervision/Verbal cueing    Lower Body Dressing/Undressing Lower body dressing      What is the patient wearing?: Underwear/pull up, Pants     Lower body assist Assist for lower body dressing: Minimal Assistance - Patient > 75%(standing balance)     Toileting Toileting Toileting Activity did not occur (Clothing management and hygiene only): N/A (no void or bm)  Toileting assist Assist for toileting: Minimal Assistance - Patient > 75% Assistive Device Comment: (Front wheel walker)   Transfers Chair/bed transfer  Transfers assist     Chair/bed transfer assist level: Minimal Assistance - Patient > 75%     Locomotion Ambulation   Ambulation assist      Assist level: Minimal Assistance - Patient > 75% Assistive device: Walker-rolling Max distance: 65 ft    Walk 10 feet activity   Assist  Walk 10 feet activity did not occur: Refused(due to pain)  Assist level: Minimal Assistance -  Patient > 75% Assistive device: Walker-rolling   Walk 50 feet activity   Assist Walk 50 feet with 2 turns activity did not occur: Refused(due to pain)  Assist level: Minimal Assistance - Patient > 75% Assistive device: Walker-rolling    Walk 150 feet activity   Assist Walk 150 feet activity did not occur: Refused(due to pain)         Walk 10 feet on uneven surface   activity   Assist Walk 10 feet on uneven surfaces activity did not occur: Refused         Wheelchair     Assist Will patient use wheelchair at discharge?: No      Wheelchair assist level: Dependent - Patient 0%      Wheelchair 50 feet with 2 turns activity    Assist    Wheelchair 50 feet with 2 turns activity did not occur: N/A       Wheelchair 150 feet activity     Assist Wheelchair 150 feet activity did not occur: N/A        Medical Problem List and Plan: 1.Confusion/altered mental status with gait disorder and speech difficultysecondary to left MCA infarction due to left M2 high-grade stenosis status post revascularizationembolic pattern likely secondary to hypercoagulable state PT, OT, SLP , Team conference today please see physician documentation under team conference tab, met with team face-to-face to discuss problems,progress, and goals. Formulized individual treatment plan based on medical history, underlying problem and comorbidities. 2. Antithrombotics: -DVT/anticoagulation:SCDs.+ DOAC -antiplatelet therapy: ELIQUIS 3. Pain Management:Tylenol as needed Epigastric pain likely due to gastric adeno carcinoma again yesterday no recurrent episodes, relieved with GI cocktail a 4. Mood:Wellbutrin 75 mg twice a day, Celexa 40 mg daily. Provide emotional support -antipsychotic agents: None States she took something at home for anxiety, already on Wellbutrin and Celexa, add gabapentin 5. Neuropsych: This patientiscapable of making decisions on herown behalf. 6. Skin/Wound Care:Routine skin checks 7. Fluids/Electrolytes/Nutrition:Routine ins and outs with follow-up chemistries 8. Poorly differentiated adenocarcinoma the stomach as well as history of multiple myeloma and remote breast cancer. Patient was to follow up outpatient with oncology services Dr. Earlie Server 9. COPD with remote tobacco use. Continue nebulizer  treatments as directed 10. Chronic anemia/thrombocytopenia. Patient follow-up outpatient with hematology oncology. Transfuse as needed 11. Orthostatic hypotension. ProAmatine 10 mg daily 12. CKD stage IV. Creatinine baseline 2.29-2.93. Follow-up chemistries 13. Hypothyroidism. Synthroid 14. GERD. Protonix 15. Hyperlipidemia. Lipitor  16.  Constipation- add senna, feels the need for BM this am 17.  HypoK KCL 16mq BID started 3/2 LOS: 4 days [place] Florencio Hollibaugh 03/19/2018, 8:38 AM

## 2018-03-19 NOTE — Progress Notes (Signed)
Physical Therapy Session Note  Patient Details  Name: Nancy Norris MRN: [id number] Date of Birth: June 22, 1940  Today's Date: 03/19/2018 PT Individual Time: 5329-9242 PT Individual Time Calculation (min): 40 min   Short Term Goals: Week 1:  PT Short Term Goal 1 (Week 1): = LTGs due to ELOS (supervision overall)  Skilled Therapeutic Interventions/Progress Updates:  Pt received in bed & agreeable to tx. No c/o pain reported during session. Pt transfers to EOB with supervision & hospital bed features, bed>w/c with RW & CGA. Transported pt to/from gym via w/c dependent assist for time management. Pt ambulates 100 ft with RW & min assist with cuing 2/2 R inattention to body & space. Pt requires cuing for safe hand placement for stand>sit transfers. Pt engaged in ball toss to focus on standing balance, requiring CGA<>min assist. Pt reports inconsistent visual impairments (blurry looking to L, but R visual field cut in R eye?). Pt utilized cybex kinetron in sitting with task focusing on BLE strengthening & NMR with pt completing 1 minute bouts x 3 times. Pt left sitting in w/c in room with chair alarm donned & in care of NT.  Therapy Documentation Precautions:  Precautions Precautions: Fall Precaution Comments: Portacath + fistula Lt forearm  Restrictions Weight Bearing Restrictions: No     Therapy/Group: Individual Therapy  Nancy Norris 03/19/2018, 10:00 AM

## 2018-03-19 NOTE — Consult Note (Signed)
Neuropsychological Consultation   Patient:   Nancy Norris   DOB:   [birth year]  MR Number:  [id number]  Location:  [facility] [id number] [place] [address] Dept: [place]: [phone]           Date of Service:   03/19/2018  Start Time:   11 AM End Time:   12 PM  Provider/Observer:  Ilean Skill, Psy.D.       Clinical Neuropsychologist       Billing Code/Service: [phone]  Chief Complaint:    Nancy Norris is a 78 year old female with history of multiple myeloma in remission, remote breast cancer as well as COPD.  Medical issues also include chronic anemia with thrombocytopenia, fibromyalgia as well as CKD stage IV.   Patient presented on 02/26/2018 to ED for episodes of dizziness as well as headache and fall.  Patient treated and discharged.  New admission had repeat CT showing 2 sperate areas of abnormal cortical and subcortical edema in left hemisphere affecting the left parietal lobe occipital lobes that were not present on 02/26/2018.  MRI showed left greater than right small to moderate size multifocal infarctions.  Acute ischemia within multiple vascular territories in both hemispheres.  On 03/12/2018 patient became more confused and garbled speech/expressive aphasia and bilateral upper extremity increased weakness as wellas increased tone.  New CTscan showed evolving subacute infarctions.  IR consulted and underwent complete revascularization of occluded left MCA distal M1 occlusion.  Patient has returned to CIR and has had significant improvements in cognitive and motor functioning.  Aphasia has improved significantly.    Reason for Service:  The patient was referred for neuropsychological consultation due to coping and adjustment issues.  Below is the HPI for the current admission.    LOV:Nancy Norris is a 78 year old right-handed female she does have a history of multiple  myeloma in remission followed by Dr. Earlie Server, remote breast cancer as well as COPD with remote tobacco abuse 13 years ago, medical history also complicated by chronic anemia with thrombocytopenia, fibromyalgia as well as CK D stage IV with creatinine 2.29-2.93 andhypotension maintained on ProAmatine. Per chart review she lives alone and was independent prior to admission she has a daughter in the area. One level home with ramped entrance. Patient initially presented 03/03/2018 with altered mental status difficulty swallowing as well as headache 2 weeks. Lactic acid 1.2, mildly elevated ammonia level. She was seen in the ED on 02/26/2018 for episodes of dizziness as well as headache and fall. CT of the head was negative. She had a hemoglobin of 7.9 with noted history of chronic anemia 8 urine culture showed greater than 100,000 Escherichia coli she was discharged to home after being placed on Keflex. On this latest admission cranial CT scan repeated showing 2 separate areas of abnormal cortical and subcortical edema in the left hemisphere affecting the left parietal lobe occipital lobes that were not present on 02/26/2018. MRI the brain showed left greater than right small to moderate size multifocal infarctions. Per report, acute ischemia within multi vascular territories in both hemispheres. MRA was unremarkable. Patient did not receive TPA. Her echocardiogram showed ejection fraction of 95% normal systolic function. Carotid Dopplers without ICA stenosis. Blood cultures positive for staph species and Proteus she was placed on antibiotic therapy with vancomycin and ceftriaxone 03/06/2018. A swallow study completed as well as esophagram 03/06/2018 showed nonspecific esophageal dysmotility no appreciable upper gastric  diverticulum and an initially on a regular diet. TEE completed 03/07/2018 with ejection fraction of 65% and no thrombus or vegetation. Small PFO seen by color Doppler. She did remain on aspirin for  CVA prophylaxis. Gastroenterology services consulted for evaluation of noted abnormal barium swallow question GI bleed with noted history of anemia. An upper GI endoscopy completed 03/08/2020 per Dr. Michail Sermon showed a large fungating malignant appearing ulcerated proximal stomach mass that had areas of blood seen consistent with recent bleeding and and biopsies obtained. Mass likely causing partial obstruction which was contributing to her dysphagia. Recommendations were for CT of abdomen and pelvis completed showing marked wall thickening in the esophagogastric junction and proximal stomach consistent with known neoplasm. Bulky lymphadenopathy in the gastrohepatic ligament compatible with metastatic disease. There was a 2.7 cm hypoattenuating lesion in the spleen as well as scattered tiny pulmonary nodules measuring up to 5 mm and mild lymphadenopathy noted in the upper mediastinum 1.9 x 1.2 cm irregular lesion posterior left upper lobe at the site of a 4 cm mass that was also noted on PET scan 10/11/2015 features compatible with residual of that lesion. Oncology services Dr. Earlie Server follow-up in regards to findings pathology report remain pending current plan was to await biopsy and make recommendations about management of suspicious malignancy. Hospital course complicated by findings of acute DVT left posterior tibial vein and left peroneal vein radiology services consulted 03/09/2018 underwent IVC filter placement by Dr. Barbie Banner of interventional radiology. On 03/10/2018 cardiology again follow-up for nonspecific chest discomfort dizziness as well as elevated troponin 2.15-2.3. EKG did show new Q waves on her ECG in the inferior leads. Repeat echocardiogram showing ejection fraction 65%. The right ventricle normal systolic function no increase in right ventricular wall thickness. Therapy evaluations completed and patient was admitted to inpatient rehabilitation services 03/11/2018. On the afternoon of 03/12/2018  patient became more confused and garbled speech/expressive aphasia and bilateral upper extremity increased weakness as well as increased tone. Neurology service was consulted stat cranial CT scan obtained that showed evolving subacute infarctions without evidence of new intracranial abnormality. Patient was discharged to acute care services 03/12/2018 for further evaluation. CT angiogram of head and neck showed high-grade stenosis versus nonocclusive thrombus involving the proximal left superior M2 division. Interventional radiology consulted and underwent complete revascularization of occluded left MCA distal M1 occlusion.Ffter discussing with hematology oncology services in regards to left MCA infarction likely related to hypercoagulable state plan was to begin [facility] in place of aspirin therapy and watch for any bleeding tendencies.Her diet remains a regular consistency. Pathology report of gastric mass returned poorly differentiated adenocarcinoma of the stomach and follow-up oncology services and plan to follow-up as outpatient. Therapy evaluations again have been completed after latest event and recommendations were to return to inpatient rehabilitation services for comprehensive therapies.  Current Status:  Patient denies significant depression or anxiety but that it is stressful being in hospital for extended stay.  Patient identifies improvements in functioning, which helps with mood and motivation.    Behavioral Observation: Nancy Norris  presents as a 78 y.o.-year-old Right Caucasian Female who appeared her stated age. her dress was Appropriate and she was Well Groomed and her manners were Appropriate to the situation.  her participation was indicative of Appropriate and Redirectable behaviors.  There were any physical disabilities noted.  she displayed an appropriate level of cooperation and motivation.     Interactions:    Active Appropriate and Redirectable  Attention:   abnormal and  attention  span appeared shorter than expected for age  Memory:   abnormal; remote memory intact, recent memory impaired  Visuo-spatial:  not examined  Speech (Volume):  low  Speech:   Slowed responding but expressive aphasia improved considerably.  Thought Process:  Coherent and Relevant  Though Content:  WNL; not suicidal and not homicidal  Orientation:   person, place and time/date  Judgment:   Fair  Planning:   Fair  Affect:    Appropriate and Lethargic  Mood:    Dysphoric  Insight:   Fair  Intelligence:   normal  Medical History:   Past Medical History:  Diagnosis Date  . Anemia   . Anginal pain ([place])    used NTG x 2 May 31 and 06/15/13   . Anxiety   . Arthritis   . B12 deficiency 12/04/2014  . Breast cancer ([place])   . Complication of anesthesia   . COPD (chronic obstructive pulmonary disease) ([place])   . Cryptococcal pneumonitis ([place]) 11/22/2015  . Depression   . Dizziness   . Dyspnea   . Fibromyalgia   . Fibromyalgia   . GERD (gastroesophageal reflux disease)   . Headache(784.0)   . Heart murmur   . Hemoptysis 10/21/2015  . History of blood transfusion    last one May 12   . Hx of cardiovascular stress test    LexiScan with low level exercise Myoview (02/2013): No ischemia, EF 72%; normal study  . Hx of echocardiogram    a.  Echocardiogram (12/26/2012): EF 17-40%, grade 1 diastolic dysfunction;   b.  Echocardiogram (02/2013): EF 55-60%, no WMA, trivial effusion  . Hyperkalemia   . Hyponatremia   . Hypotension   . Hypothyroidism   . Mucositis   . Multiple myeloma   . Myocardial infarction [facility])    in past, patient was unaware.   . Neuropathy   . Nodule of left lung 09/13/2015  . Pain in joint, pelvic region and thigh 07/07/2015  . Pneumonia    several  . PONV (postoperative nausea and vomiting) 2008   after mastestomy   Psychiatric History:  Patient does have past history of depression and anxiety dx.    Family Med/Psych History:  Family History   Problem Relation Age of Onset  . Arthritis Mother   . Asthma Mother   . Cancer Sister   . Hyperlipidemia Brother    Impression/DX:  Nancy Norris is a 78 year old female with history of multiple myeloma in remission, remote breast cancer as well as COPD.  Medical issues also include chronic anemia with thrombocytopenia, fibromyalgia as well as CKD stage IV.   Patient presented on 02/26/2018 to ED for episodes of dizziness as well as headache and fall.  Patient treated and discharged.  New admission had repeat CT showing 2 sperate areas of abnormal cortical and subcortical edema in left hemisphere affecting the left parietal lobe occipital lobes that were not present on 02/26/2018.  MRI showed left greater than right small to moderate size multifocal infarctions.  Acute ischemia within multiple vascular territories in both hemispheres.  On 03/12/2018 patient became more confused and garbled speech/expressive aphasia and bilateral upper extremity increased weakness as wellas increased tone.  New CTscan showed evolving subacute infarctions.  IR consulted and underwent complete revascularization of occluded left MCA distal M1 occlusion.  Patient has returned to CIR and has had significant improvements in cognitive and motor functioning.  Aphasia has improved significantly.    Patient denies significant depression or anxiety but  that it is stressful being in hospital for extended stay.  Patient identifies improvements in functioning, which helps with mood and motivation.   Diagnosis:   CVA        Electronically Signed   _______________________ Ilean Skill, Psy.D.

## 2018-03-19 NOTE — Progress Notes (Signed)
Speech Language Pathology Daily Session Note  Patient Details  Name: Nancy Norris MRN: [id number] Date of Birth: 12-29-1940  Today's Date: 03/19/2018 SLP Individual Time: 0730-0800 SLP Individual Time Calculation (min): 30 min  Short Term Goals: Week 1: SLP Short Term Goal 1 (Week 1): Pt will complete functional basic problem solving tasks with Min A. SLP Short Term Goal 2 (Week 1): Pt will utilize word finding strategies to produce sentence length utterances with Mod A cues in 8 out of 10 opportunities.  SLP Short Term Goal 3 (Week 1): Pt will demonstrate selective attention for ~ 30 minutes in mildy distracting environment with Min A cues.   Skilled Therapeutic Interventions:  Skilled treatment session focused on cognition goals. SLP facilitated session by providing Max A verbal, visual and tactile cues to problem solve how to count money. Despite demonstration of strategies (i.e., counting by dimes, then nickels) but required physical cues to use strategy. Pt with repetitive question - "why is this so hard?" Education provided on location of stroke, resultant deficits and pt's progess towards goals. Pt was left in bed, bed alarm on and all needs within reach. Continue per current plan of care.      Pain Pain Assessment Pain Scale: 0-10 Pain Score: 0-No pain  Therapy/Group: Individual Therapy  Kanden Carey 03/19/2018, 11:24 AM

## 2018-03-19 NOTE — Progress Notes (Signed)
Occupational Therapy Session Note  Patient Details  Name: Nancy Norris MRN: [id number] Date of Birth: 04-08-1940  Today's Date: 03/19/2018 OT Individual Time: 1440-1535 OT Individual Time Calculation (min): 55 min    Short Term Goals: Week 1:  OT Short Term Goal 1 (Week 1): STGs=LTGs due to ELOS  Skilled Therapeutic Interventions/Progress Updates:    Treatment session with focus on ADL retraining, functional transfers, standing balance, and Rt attention.  Pt received seated EOB finishing lunch.  Pt expressing desire to get dressed, but declined bathing.  Completed dressing with min-mod cues to attend to Rt side of body as pt initially omitting dressing RUE.  Pt required assistance when threading BLE in to pants and steady assist when pulling pants over hips.  Pt reports need to toilet.  Ambulated to toilet without AD with min-mod assist due to Rt inattention and urgency.  Pt completed toileting with min assist.  Returned to sink with RW and min assist/min cues for attention to obstacles on Rt.  Pt completed grooming tasks in sitting for energy conservation, per pt request.  Engaged in dynamic standing and Rt attention with [employer].  Pt required mod verbal cues to scan to Rt during task.  Pt successfully locating 7, 18, 8, and 12 lights in 60 seconds with fluctuating reaction times on Rt with as little as 3.97 seconds and as much as 8.11 on Rt side of board.  Educated on Rt inattention and fluctuations in vision.  Pt returned to room and transferred back to bed min assist.  Left semi-reclined with all needs in reach.  Therapy Documentation Precautions:  Precautions Precautions: Fall Precaution Comments: Portacath + fistula Lt forearm  Restrictions Weight Bearing Restrictions: No General:   Vital Signs: Therapy Vitals Temp: 99.1 F (37.3 C) Temp Source: Oral Pulse Rate: 68 BP: 116/66 Oxygen Therapy SpO2: 98 % Pain:  Pt with no c/o pain   Therapy/Group: Individual  Therapy  [name] 03/19/2018, 4:01 PM

## 2018-03-20 ENCOUNTER — Inpatient Hospital Stay (HOSPITAL_COMMUNITY): Payer: Medicare Other

## 2018-03-20 ENCOUNTER — Inpatient Hospital Stay (HOSPITAL_COMMUNITY): Payer: Medicare Other | Admitting: Speech Pathology

## 2018-03-20 ENCOUNTER — Inpatient Hospital Stay (HOSPITAL_COMMUNITY): Payer: Medicare Other | Admitting: Occupational Therapy

## 2018-03-20 ENCOUNTER — Inpatient Hospital Stay (HOSPITAL_COMMUNITY): Payer: Medicare Other | Admitting: Physical Therapy

## 2018-03-20 ENCOUNTER — Ambulatory Visit: Payer: Medicare Other

## 2018-03-20 DIAGNOSIS — E876 Hypokalemia: Secondary | ICD-10-CM

## 2018-03-20 DIAGNOSIS — N184 Chronic kidney disease, stage 4 (severe): Secondary | ICD-10-CM

## 2018-03-20 LAB — CBC
HEMATOCRIT: 28.6 % — AB (ref 36.0–46.0)
Hemoglobin: 9 g/dL — ABNORMAL LOW (ref 12.0–15.0)
MCH: 32.4 pg (ref 26.0–34.0)
MCHC: 31.5 g/dL (ref 30.0–36.0)
MCV: 102.9 fL — ABNORMAL HIGH (ref 80.0–100.0)
Platelets: 52 10*3/uL — ABNORMAL LOW (ref 150–400)
RBC: 2.78 MIL/uL — AB (ref 3.87–5.11)
RDW: 21.7 % — ABNORMAL HIGH (ref 11.5–15.5)
WBC: 5.2 10*3/uL (ref 4.0–10.5)
nRBC: 0 % (ref 0.0–0.2)

## 2018-03-20 LAB — BASIC METABOLIC PANEL
Anion gap: 7 (ref 5–15)
BUN: 16 mg/dL (ref 8–23)
CHLORIDE: 108 mmol/L (ref 98–111)
CO2: 22 mmol/L (ref 22–32)
Calcium: 8.9 mg/dL (ref 8.9–10.3)
Creatinine, Ser: 2.59 mg/dL — ABNORMAL HIGH (ref 0.44–1.00)
GFR calc Af Amer: 20 mL/min — ABNORMAL LOW (ref >60)
GFR calc non Af Amer: 17 mL/min — ABNORMAL LOW (ref >60)
Glucose, Bld: 116 mg/dL — ABNORMAL HIGH (ref 70–99)
POTASSIUM: 3.8 mmol/L (ref 3.5–5.1)
Sodium: 137 mmol/L (ref 135–145)

## 2018-03-20 MED ORDER — HEPARIN SOD (PORK) LOCK FLUSH 100 UNIT/ML IV SOLN
500.0000 [IU] | INTRAVENOUS | Status: AC | PRN
  Administered 2018-03-20: 500 [IU]

## 2018-03-20 MED ORDER — HEPARIN SOD (PORK) LOCK FLUSH 100 UNIT/ML IV SOLN: 500.0000 [IU] | INTRAVENOUS | Status: DC | PRN

## 2018-03-20 NOTE — Progress Notes (Signed)
[place] PHYSICAL MEDICINE & REHABILITATION PROGRESS NOTE   Subjective/Complaints: No new problems. Up at EOB waiting to start therapies  ROS: Patient denies fever, rash, sore throat, blurred vision, nausea, vomiting, diarrhea, cough, shortness of breath or chest pain, joint or back pain, headache, or mood change.    Objective:   No results found. Recent Labs    03/20/18 0525  WBC 5.2  HGB 9.0*  HCT 28.6*  PLT 52*   Recent Labs    03/20/18 0525  NA 137  K 3.8  CL 108  CO2 22  GLUCOSE 116*  BUN 16  CREATININE 2.59*  CALCIUM 8.9    Intake/Output Summary (Last 24 hours) at 03/20/2018 0926 Last data filed at 03/19/2018 1840 Gross per 24 hour  Intake 360 ml  Output -  Net 360 ml     Physical Exam: Vital Signs Blood pressure 134/77, pulse 75, temperature 98.7 F (37.1 C), temperature source Oral, resp. rate 12, height '5\' 5"'$  (1.651 m), weight 79.2 kg, SpO2 99 %.   Constitutional: No distress . Vital signs reviewed. HEENT: EOMI, oral membranes moist Neck: supple Cardiovascular: RRR without murmur. No JVD    Respiratory: CTA Bilaterally without wheezes or rales. Normal effort    GI: BS +, non-tender, non-distended  Extremities: No clubbing, cyanosis, or edema Skin: No evidence of breakdown, no evidence of rash Neurologic: Cranial nerves II through XII intact, motor strength is 5/5 in bilateral deltoid, bicep, tricep, grip, hip flexor, knee extensors, ankle dorsiflexor and plantar flexor. Some delay with movement of right arm and elg.  Sensory exam normal sensation to light touch and proprioception in bilateral upper and lower extremities Cerebellar exam normal finger to nose to finger as well as heel to shin in bilateral upper and lower extremities Musculoskeletal: Full range of motion in all 4 extremities. No joint swelling Mild expressive language deficits  Assessment/Plan: 1. Functional deficits secondary to Left MCA infarct which require 3+ hours per day of  interdisciplinary therapy in a comprehensive inpatient rehab setting.  Physiatrist is providing close team supervision and 24 hour management of active medical problems listed below.  Physiatrist and rehab team continue to assess barriers to discharge/monitor patient progress toward functional and medical goals  Care Tool:  Bathing    Body parts bathed by patient: Right arm, Left arm, Chest, Abdomen, Front perineal area, Buttocks, Right upper leg, Left upper leg, Face, Right lower leg, Left lower leg         Bathing assist Assist Level: Minimal Assistance - Patient > 75%(for standing balance)     Upper Body Dressing/Undressing Upper body dressing   What is the patient wearing?: Pull over shirt    Upper body assist Assist Level: Minimal Assistance - Patient > 75%    Lower Body Dressing/Undressing Lower body dressing      What is the patient wearing?: Underwear/pull up, Pants     Lower body assist Assist for lower body dressing: Minimal Assistance - Patient > 75%     Toileting Toileting Toileting Activity did not occur (Clothing management and hygiene only): N/A (no void or bm)  Toileting assist Assist for toileting: Minimal Assistance - Patient > 75% Assistive Device Comment: (Front wheel walker)   Transfers Chair/bed transfer  Transfers assist     Chair/bed transfer assist level: Contact Guard/Touching assist     Locomotion Ambulation   Ambulation assist      Assist level: Minimal Assistance - Patient > 75% Assistive device: Walker-rolling Max distance: 100  ft   Walk 10 feet activity   Assist  Walk 10 feet activity did not occur: Refused(due to pain)  Assist level: Minimal Assistance - Patient > 75% Assistive device: Walker-rolling   Walk 50 feet activity   Assist Walk 50 feet with 2 turns activity did not occur: Refused(due to pain)  Assist level: Minimal Assistance - Patient > 75% Assistive device: Walker-rolling    Walk 150 feet  activity   Assist Walk 150 feet activity did not occur: Refused(due to pain)         Walk 10 feet on uneven surface  activity   Assist Walk 10 feet on uneven surfaces activity did not occur: Refused         Wheelchair     Assist Will patient use wheelchair at discharge?: No      Wheelchair assist level: Dependent - Patient 0%      Wheelchair 50 feet with 2 turns activity    Assist    Wheelchair 50 feet with 2 turns activity did not occur: N/A       Wheelchair 150 feet activity     Assist Wheelchair 150 feet activity did not occur: N/A        Medical Problem List and Plan: 1.Confusion/altered mental status with gait disorder and speech difficultysecondary to left MCA infarction due to left M2 high-grade stenosis status post revascularizationembolic pattern likely secondary to hypercoagulable state  --Continue CIR therapies including PT, OT, and SLP  2. Antithrombotics: -DVT/anticoagulation:SCDs.+ DOAC -antiplatelet therapy: ELIQUIS 3. Pain Management:Tylenol as needed  Epigastric pain likely due to gastric adeno carcinoma again yesterday no recurrent episodes, relieved with GI cocktail. No further issues today 4. Mood:Wellbutrin 75 mg twice a day, Celexa 40 mg daily. Provide emotional support -antipsychotic agents: None  States she took something at home for anxiety, already on Wellbutrin and Celexa   -added gabapentin 5. Neuropsych: This patientiscapable of making decisions on herown behalf. 6. Skin/Wound Care:Routine skin checks 7. Fluids/Electrolytes/Nutrition:I personally reviewed the patient's labs today.   8. Poorly differentiated adenocarcinoma the stomach as well as history of multiple myeloma and remote breast cancer. Patient was to follow up outpatient with oncology services Dr. Earlie Server 9. COPD with remote tobacco use. Continue nebulizer treatments as directed 10. Chronic  anemia/thrombocytopenia. Patient follow-up outpatient with hematology oncology. Transfuse as needed 11. Orthostatic hypotension. ProAmatine 10 mg daily, bp's controlled 12. CKD stage IV. Creatinine baseline 2.29-2.93. 2.59 3/5 13. Hypothyroidism. Synthroid 14. GERD. Protonix 15. Hyperlipidemia. Lipitor  16.  Constipation- added senna, had bm 3/4 17.  HypoK KCL 33mq BID started 3/2  -recheck K+ 3.8 3/5   LOS: 5 days A FACE TO FACE EVALUATION WAS PERFORMED  ZMeredith Staggers3/05/2018, 9:26 AM

## 2018-03-20 NOTE — Progress Notes (Signed)
Patient complained of being weak on her left side. She said that doesn't feel like herself and she was very tired. The nurse tech also said that is was difficult to follow directions and concentrate on task. I called Dan, PA and he ordered a set of vitals and come by and saw the patient. Will continue to monitor at this time.

## 2018-03-20 NOTE — Progress Notes (Signed)
Occupational Therapy Session Note  Patient Details  Name: Nancy Norris MRN: [id number] Date of Birth: Feb 09, 1940  Today's Date: 03/20/2018 OT Individual Time: 3128-1188 OT Individual Time Calculation (min): 45 min    Short Term Goals: Week 1:  OT Short Term Goal 1 (Week 1): STGs=LTGs due to ELOS  Skilled Therapeutic Interventions/Progress Updates:    Treatment session with focus on activity tolerance, standing balance, and functional use of BUE.  Pt received upright in w/c declining bathing/dressing but expressing desire to brush teeth.  Attempted to complete oral care in standing for increased challenge, however pt unable to maintain standing >1 min.  Returned to sitting to complete oral care.  Pt unable to open or apply toothpaste due to increased shakiness and dropping toothpaste.  Therapist applied toothpaste and pt able to complete oral care in sitting.  Pt demonstrating increased inattention to RUE, as UE under sink and when pt attempting to reach for toothbrush with Rt hand unable to problem solve/decreased awareness of location of hand.  Engaged in [place] in sitting due to decreased endurance with focus on fine and gross motor movements in RUE as well as Rt attention.  Pt required increased time when picking up checker pieces with Rt hand and increased time when scanning to Rt.  Completed stacking of checker pieces with both Rt and Lt UE with noted increased shakiness in BUE, however pt reporting no difference between Lt and Rt.  Returned to room and left upright in w/c with seat belt alarm on and all needs in reach.  Therapy Documentation Precautions:  Precautions Precautions: Fall Precaution Comments: Portacath + fistula Lt forearm  Restrictions Weight Bearing Restrictions: No Pain: Pain Assessment Pain Scale: 0-10 Pain Score: 0-No pain   Therapy/Group: Individual Therapy  [name] 03/20/2018, 10:35 AM

## 2018-03-20 NOTE — Progress Notes (Signed)
Social Work Patient ID: Nancy Norris, female   DOB: [birth year], 78 y.o.   MRN: [id number] Met with pt yesterday to discuss team conference goals and target discharge date 3/11. Have left message for her daughter to discuss also. Concern is after daughter takes off two weeks who will be with pt after this. Pt not feeling well today. Await daughter's return call.

## 2018-03-20 NOTE — Progress Notes (Signed)
Speech Language Pathology Daily Session Note  Patient Details  Name: LASHONTA PILLING MRN: [id number] Date of Birth: 1940/12/19  Today's Date: 03/20/2018   Skilled treatment session #1 SLP Individual Time: 0800-0900 SLP Individual Time Calculation (min): 60 min   Skilled treatment session #2 SLP Individual Time: 1000-1030 SLP Individual Time Calculation (min): 30 min  Short Term Goals: Week 1: SLP Short Term Goal 1 (Week 1): Pt will complete functional basic problem solving tasks with Min A. SLP Short Term Goal 2 (Week 1): Pt will utilize word finding strategies to produce sentence length utterances with Mod A cues in 8 out of 10 opportunities.  SLP Short Term Goal 3 (Week 1): Pt will demonstrate selective attention for ~ 30 minutes in mildy distracting environment with Min A cues.   Skilled Therapeutic Interventions:  Skilled treatment session #1 focused on communication and cognition goals. SLP received pt demonstrating good safety awareness as she had pressed call light to get help ambulating to bathroom. SLP assisted pt to bathroom, pt with no evidence of word finding deficits during simple conversation. Pt required Min A to manage clothing and Max A to locate grab bar with right hand. Also while washing her hands at the sink, pt required Max A to physically locate items with right hand. Pt with overall decreased attention to right as she hit walker on door jam (entering and exiting bathroom). Pt with concern over her left leg feeling different. No s/s of stroke or any other focal deficits. Pt able to use left leg functionally. No evidence of word finding deficits during conversation when using complex language to express concerns. Nursing and PA are aware. SLP further facilitated session by providing supervision cues for selective attention in moderately distracting environment for ~ 45 minutes. SLP also provided Min A to supervision cues to complete complex medication management task. Pt with  increased overall ability to read instructions, implement strategies and self-monitor accuracy. Pt returned to room, left upright in wheelchair with lap belt alarm on and all needs within reach. Continue per current plan of care.  Skilled treatment session #2 focused on cognition goals. SLP facilitated session by providing Mod A to Min A to problem solve complex medication management task (previously started in morning session). Pt with good overall accuracy but substantially reduced efficiency. Pt able to locate next medication on list but unable to problem solve how to locate colored bead among pill bottles. She opened one bottle and it wasn't the color she was looking for and then she wasn't able to smoothly look through bottles. She was accurate once she found appropriate color but her overall ability was greatly reduced compared to morning session. Education provided to pt on impact of fatigue and overall fluctuation of ability throughout recovery process. Hence we are recommending at least Min A for cognitive tasks at discharge. Pt expressed understanding, was returned to room, left upright in wheelchair, lap belt alarm on and all needs within reach.        Pain Pain Assessment Pain Scale: 0-10 Pain Score: 0-No pain  Therapy/Group: Individual Therapy  Wilver Tignor 03/20/2018, 8:52 AM

## 2018-03-20 NOTE — Progress Notes (Signed)
Physical Therapy Session Note  Patient Details  Name: Nancy Norris MRN: [id number] Date of Birth: [birth year]  Today's Date: 03/20/2018 PT Individual Time: 1287-8676 PT Individual Time Calculation (min): 45 min   Short Term Goals: Week 1:  PT Short Term Goal 1 (Week 1): = LTGs due to ELOS (supervision overall)  Skilled Therapeutic Interventions/Progress Updates:     Patient sitting EOB upon PT arrival. Patient alert and agreeable to PT session. Patient performed sit to/from stand x4 with CGA to min A for safety, balance, and DME managment. Patient performed without verbal cues with correct sequencing and technique at beginning of session. Patient ambulated 180 feet using RW with CGA performing 2 turns to the L. Ambulated with decreased step length, ataxic gait with fatigue, performed wide turns to the L, and ran into an obstacle on the R x1. Then she ambulated 40 feet using RW with CGA performing 1 turn to the R. Ambulated with decreased gait speed, decreased step length, increased ataxia, and difficulty navigating the RW compared to the first trial. Patient stated that she felt dizzy and had a sudden 10/10 headache during ambulating and was immediately provided a chair to sit in and vitals were WFL, see below. Patient was provided a sitting rest break for 2 minutes with a little change in symptoms and vitals continued to be [facility]. Patient was transported back to her room in the w/c and transferred back to the bed with mod A for sit to/from stand and from sit to supine. Patient required increased time to respond and presented with confused mental state stating "what bed" when told she was lying in the bed by PT. Nursing was notified of patients symptoms and vitals and reported that the MD would be notified of the event. PT will follow-up on patient status and will reassess patient symptoms and functional mobility during next session.   Therapy Documentation Precautions:  Precautions Precautions:  Fall Precaution Comments: Portacath + fistula Lt forearm  Restrictions Weight Bearing Restrictions: No General:   Vital Signs: BP: 118/73 in sitting at onset of symptoms during session. BP: 121/72 in sitting after 2 min rest with some ease of symptoms.   Pain:  Patient denied pain at beginning of session and reported 10/10 headache at end of session.   Therapy/Group: Individual Therapy  Shea Swalley Clent Demark, PT, DPT Leticia Penna, PT, DPT  03/20/2018, 4:56 PM

## 2018-03-20 NOTE — Progress Notes (Signed)
At the beginning of the shift, was given report that patient had some coordination disturbances & confusion on the previous shift. Was informed that  Her gabapentin was discontinued due to suspicion to be the cause of acute changes. A Cat scan & chest xray was performed. A urinalysis was ordered but not completed as yet. She was noted sleeping with her daughter at her bedside, which seems to be different from the last previous days at this time of the evening. Her daughter was concerned about the results of her tests & was informed that the physician would get the results as they read the Ct scan & that she would be contacted if there were any changes in condition or treatment. Patient continued to sleep after she left. When this nurse went in to the patient to give her night meds, noted that gabapentin was still ordered & there was no notes. On call provider was called & verbal order was given to hold the medication. Patient was aroused easily by voice. She had a little confusion, but was able to answer simple questions. She c/o no pain, dizziness, SOB or headaches. Medications were given orally, but noticed that patient had some difficulty coordinating the medicine cup to her lips. She had to use both hands to guide it & still missed it slightly before correcting it. She also seemed not to be able to turn the cup up enough to get the 2nd pill out & had to come back to it. She drank most of her water but then seemed to not feel it in her hands & slowly overturned it. She still denied any symptoms. Her grips were strong when tested & she had good strength with lifting both legs & arms. She followed commands but was a little delayed. Asked the patient if she needed to toilet & she stated yes. Nurse tech was called to assist due to confusion. During transfer, patient was very weak getting to a standing position & had to be given some support. When she stood & began walking, her gait was unsteady & the rolling walker  started to drift off to the left side. She needed multiple cues & seemed not to understand to pull the walker back in front of her to go forward. The walker softly hit the sink & was guided for her. When we got to the bathroom, she pushed the walker as to be putting it to the right side before we got into the door & was redirected. In the bathroom, she had trouble sitting & had to be guided. As she sat, a new gait belt was obtained & placed. She was able to do her own perineal care after she was assisted to stand. There was a hat in the toilet to obtain a urine specimen but somehow she missed the whole hat, so specimen not obtained. When walking back to the bed, there were the same type of coordination issues but this time, she began walking toward the wall. Her eyes were not focused at all. Questioned patient about her vision & she stated that she could not see. She was assisted back to bed & was questioned about her vision. She at that time had very delayed responses & at times did not answer at all. She did state that she could not see at all. She then put her hand to the right side of her head & grimaced. When asked if she had a headache, she stated yes. As the Tech made her comfortable in  bed, this nurse went to get tylenol. After she took the tylenol & drank her water, she sat for a few minutes. This nurse crossed over to the other side of the room, which her eyes did not follow, but eventually her eyes focused on me. At that point, she was responsive & was reoriented to the situation. She answered appropriately & was informed about her daughters visit. She declined a call to her daughter. She denied any other symptoms, except for the headache. No acute distress noted at this time. Will continue to monitor & will call her daughtter in the morning with updates on condition.

## 2018-03-21 ENCOUNTER — Inpatient Hospital Stay (HOSPITAL_COMMUNITY): Payer: Medicare Other | Admitting: Occupational Therapy

## 2018-03-21 ENCOUNTER — Inpatient Hospital Stay (HOSPITAL_COMMUNITY): Payer: Medicare Other | Admitting: Speech Pathology

## 2018-03-21 ENCOUNTER — Inpatient Hospital Stay (HOSPITAL_COMMUNITY): Payer: Medicare Other | Admitting: Physical Therapy

## 2018-03-21 ENCOUNTER — Inpatient Hospital Stay (HOSPITAL_COMMUNITY): Payer: Medicare Other

## 2018-03-21 DIAGNOSIS — G441 Vascular headache, not elsewhere classified: Secondary | ICD-10-CM

## 2018-03-21 LAB — URINALYSIS, ROUTINE W REFLEX MICROSCOPIC
Bilirubin Urine: NEGATIVE
GLUCOSE, UA: 50 mg/dL — AB
Ketones, ur: NEGATIVE mg/dL
Leukocytes,Ua: NEGATIVE
Nitrite: NEGATIVE
Protein, ur: NEGATIVE mg/dL
Specific Gravity, Urine: 1.004 — ABNORMAL LOW (ref 1.005–1.030)
pH: 7 (ref 5.0–8.0)

## 2018-03-21 MED ORDER — TOPIRAMATE 25 MG PO TABS
25.0000 mg | ORAL_TABLET | Freq: Every day | ORAL | Status: DC
  Administered 2018-03-21: 25 mg via ORAL
  Filled 2018-03-21: qty 1

## 2018-03-21 MED ORDER — TOPIRAMATE 25 MG PO TABS
25.0000 mg | ORAL_TABLET | Freq: Every day | ORAL | Status: DC
  Administered 2018-03-21 – 2018-03-24 (×4): 25 mg via ORAL
  Filled 2018-03-21 (×4): qty 1

## 2018-03-21 NOTE — Progress Notes (Signed)
Social Work Patient ID: Nancy Norris, female   DOB: [birth year], 78 y.o.   MRN: [id number] Met with pt and daughter this am to discuss care at discharge and team conference recommendations-24 hr supervision. Daughter will be off for two weeks and then go from there. She is hoping she sill be able to be alone intermittently, but will need to make sure she gets to her appointments. Asked daughter to come in for family education and she is unsure if she can. Will work toward discharge next Wed.

## 2018-03-21 NOTE — Progress Notes (Signed)
[place] PHYSICAL MEDICINE & REHABILITATION PROGRESS NOTE   Subjective/Complaints: Pt developed headache yesterday evening with associated lethargy and increased word finding deficits. Headache still severe this morning involving right fronto-parietal area. She is photophobic and having difficulty tolerating certain smells. CT and CXR without acute changes. Daughter at bedside  ROS: Patient denies fever, rash, sore throat, blurred vision, nausea, vomiting, diarrhea, cough, shortness of breath or chest pain, joint or back pain,  or mood change.    Objective:   Dg Chest 2 View  Result Date: 03/20/2018 CLINICAL DATA:  LEFT-sided weakness. EXAM: CHEST - 2 VIEW COMPARISON:  Chest x-ray dated 03/03/2018. FINDINGS: Heart size and mediastinal contours are stable. RIGHT chest wall Port-A-Cath is stable in position with tip at the level of the lower SVC/cavoatrial junction. Mild scarring/atelectasis again noted within the LEFT mid lung. Lungs are otherwise clear. No pleural effusion or pneumothorax seen. No acute or suspicious osseous finding. IMPRESSION: No active cardiopulmonary disease. No evidence of pneumonia or pulmonary edema. Electronically Signed   By: Franki Cabot M.D.   On: 03/20/2018 19:52   Ct Head Wo Contrast  Result Date: 03/20/2018 CLINICAL DATA:  78 y/o F; Headache, acute, severe, worst HA of life. EXAM: CT HEAD WITHOUT CONTRAST TECHNIQUE: Contiguous axial images were obtained from the base of the skull through the vertex without intravenous contrast. COMPARISON:  03/13/2018 MRI of the head. 03/12/2018 CTA of the head. FINDINGS: Brain: Infarcts within the left parietal lobe and basal ganglia as well as very small infarcts in the right parietal lobe stable in distribution from prior MRI with interval decrease in local edema and mass effect. Small chronic infarcts are present in the left occipital lobe and right superior cerebellum. Stable background of moderate chronic microvascular ischemic  changes and volume loss of brain. No new acute stroke, hemorrhage, extra-axial collection, hydrocephalus, or mass effect. Vascular: Calcific atherosclerosis of carotid siphons. No hyperdense vessel identified. Skull: Normal. Negative for fracture or focal lesion. Sinuses/Orbits: No acute finding. Other: None. IMPRESSION: 1. No acute intracranial abnormality identified. 2. Stable distribution of recent infarcts in the left-greater-than-right parietal lobe and left basal ganglia from prior MRI. Decreased edema and local mass effect. 3. Stable background of moderate chronic microvascular ischemic changes, parenchymal volume loss of the brain, and chronic infarctions. Electronically Signed   By: Kristine Garbe M.D.   On: 03/20/2018 19:09   Recent Labs    03/20/18 0525  WBC 5.2  HGB 9.0*  HCT 28.6*  PLT 52*   Recent Labs    03/20/18 0525  NA 137  K 3.8  CL 108  CO2 22  GLUCOSE 116*  BUN 16  CREATININE 2.59*  CALCIUM 8.9    Intake/Output Summary (Last 24 hours) at 03/21/2018 0846 Last data filed at 03/21/2018 0700 Gross per 24 hour  Intake 120 ml  Output 600 ml  Net -480 ml     Physical Exam: Vital Signs Blood pressure 113/60, pulse 80, temperature 98 F (36.7 C), temperature source Oral, resp. rate 18, height '5\' 5"'$  (1.651 m), weight 79.2 kg, SpO2 97 %.   Constitutional:  In distress because of headache. Vital signs reviewed. HEENT: EOMI, oral membranes moist Neck: supple Cardiovascular: RRR without murmur. No JVD    Respiratory: CTA Bilaterally without wheezes or rales. Normal effort    GI: BS +, non-tender, non-distended  Extremities: No clubbing, cyanosis, or edema Skin: No evidence of breakdown, no evidence of rash Neurologic: having more difficulties with visual acuity but could count  fingers held in front of her at 3 feet. . Sensitive to light especially on right, keepin right lid closed frequently. Able to attend to all fields during confrontation, tracks to all  fields. motor strength is 4+/5 RUE and RLE and 5/5 LUE and LLE. Mild word finding deficits perhaps a little more than baseline.  No obvious pronator drift. Senses pain and light touch Musculoskeletal: Full range of motion in all 4 extremities. No joint swelling    Assessment/Plan: 1. Functional deficits secondary to Left MCA infarct which require 3+ hours per day of interdisciplinary therapy in a comprehensive inpatient rehab setting.  Physiatrist is providing close team supervision and 24 hour management of active medical problems listed below.  Physiatrist and rehab team continue to assess barriers to discharge/monitor patient progress toward functional and medical goals  Care Tool:  Bathing    Body parts bathed by patient: Right arm, Left arm, Chest, Abdomen, Front perineal area, Buttocks, Right upper leg, Left upper leg, Face, Right lower leg, Left lower leg         Bathing assist Assist Level: Minimal Assistance - Patient > 75%(for standing balance)     Upper Body Dressing/Undressing Upper body dressing   What is the patient wearing?: Pull over shirt    Upper body assist Assist Level: Minimal Assistance - Patient > 75%    Lower Body Dressing/Undressing Lower body dressing      What is the patient wearing?: Underwear/pull up, Pants     Lower body assist Assist for lower body dressing: Minimal Assistance - Patient > 75%     Toileting Toileting Toileting Activity did not occur (Clothing management and hygiene only): N/A (no void or bm)  Toileting assist Assist for toileting: Minimal Assistance - Patient > 75% Assistive Device Comment: (Front wheel walker)   Transfers Chair/bed transfer  Transfers assist     Chair/bed transfer assist level: Minimal Assistance - Patient > 75%     Locomotion Ambulation   Ambulation assist      Assist level: Minimal Assistance - Patient > 75% Assistive device: Walker-rolling Max distance: 180   Walk 10 feet  activity   Assist  Walk 10 feet activity did not occur: Refused(due to pain)  Assist level: Minimal Assistance - Patient > 75% Assistive device: Walker-rolling   Walk 50 feet activity   Assist Walk 50 feet with 2 turns activity did not occur: Refused(due to pain)  Assist level: Minimal Assistance - Patient > 75% Assistive device: Walker-rolling    Walk 150 feet activity   Assist Walk 150 feet activity did not occur: Refused(due to pain)  Assist level: Minimal Assistance - Patient > 75% Assistive device: Walker-rolling    Walk 10 feet on uneven surface  activity   Assist Walk 10 feet on uneven surfaces activity did not occur: Refused         Wheelchair     Assist Will patient use wheelchair at discharge?: No      Wheelchair assist level: Dependent - Patient 0%      Wheelchair 50 feet with 2 turns activity    Assist    Wheelchair 50 feet with 2 turns activity did not occur: N/A       Wheelchair 150 feet activity     Assist Wheelchair 150 feet activity did not occur: N/A        Medical Problem List and Plan: 1.Confusion/altered mental status with gait disorder and speech difficultysecondary to left MCA infarction due to left M2 high-grade  stenosis status post revascularizationembolic pattern likely secondary to hypercoagulable state  -pt with non-specific visual changes, severe right-sided headache,  confusion, coordination difficulties since yesterday afternoon. CT head and CXR both reviewed personally and without acute changes. Labs yesterday within normal limits  -this could be a migraine/vascular headache; however we need to repeat MRI of brain to assess for other acute changes related to her previous infarct  -spoke with daughter and patient at length  -will give '25mg'$  topamax for headache relief   -tramadol for severe headache 2. Antithrombotics: -DVT/anticoagulation:SCDs.+ DOAC -antiplatelet therapy: ELIQUIS 3.  Pain Management:Tylenol as needed  Epigastric pain likely due to gastric adeno carcinoma relieved with GI cocktail  -see h/a rx above 4. Mood:Wellbutrin 75 mg twice a day, Celexa 40 mg daily. Provide emotional support -antipsychotic agents: None  States she took something at home for anxiety, already on Wellbutrin and Celexa   -held gabapentin 5. Neuropsych: This patientiscapable of making decisions on herown behalf. 6. Skin/Wound Care:Routine skin checks 7. Fluids/Electrolytes/Nutrition:I personally reviewed the patient's labs today.   8. Poorly differentiated adenocarcinoma the stomach as well as history of multiple myeloma and remote breast cancer. Patient  to follow up outpatient with oncology services Dr. Earlie Server 9. COPD with remote tobacco use. Continue nebulizer treatments as directed 10. Chronic anemia/thrombocytopenia. Patient follow-up outpatient with hematology oncology. Transfuse as needed 11. Orthostatic hypotension. ProAmatine 10 mg daily, bp's controlled 12. CKD stage IV. Creatinine baseline 2.29-2.93. 2.59 3/5 13. Hypothyroidism. Synthroid 14. GERD. Protonix 15. Hyperlipidemia. Lipitor  16.  Constipation- added senna, had bm 3/4 17.  HypoK KCL 46mq BID started 3/2  -recheck K+ 3.8 3/5   LOS: 6 days A FACE TO FACE EVALUATION WAS PERFORMED  ZMeredith Staggers3/06/2018, 8:46 AM

## 2018-03-21 NOTE — Progress Notes (Signed)
Noted increased confusion and dizziness since the addition of Neurontin. We'll hold for now continue to monitor. Cranial CT scan follow-up negative for acute changes.

## 2018-03-21 NOTE — Progress Notes (Signed)
PM&R ADDENDUM:  MRI reviewed demonstrating acute/subacute bi-cerebral infarcts. I have spoken to Dr. Leonie Man. Also, I reviewed findings of MRI and neurology recommendations with patient and daughter. I discussed the possibility of a palliative care consult to help set goals of care with the daughter as well. She would like to discuss first with her mother before proceeding. At present, the patient is comfortable and without headache. Will continue with topamax as prescribed.   Meredith Staggers, MD, [facility] 03/21/2018

## 2018-03-21 NOTE — Progress Notes (Signed)
Physical Therapy Note  Patient Details  Name: ANE CONERLY MRN: [id number] Date of Birth: 1940/01/23 Today's Date: 03/21/2018    Attempted to see pt for scheduled PT session but pt off unit for MRI. Pt missed 30 minutes of skilled PT treatment. Will f/u per POC.   [name] 03/21/2018, 9:29 AM

## 2018-03-21 NOTE — Progress Notes (Signed)
ANTICOAGULATION CONSULT NOTE - Follow Up Consult  Pharmacy Consult for Eliquis Indication: atrial fibrillation       Allergies  Allergen Reactions  . Codeine Anaphylaxis    anaphylasix tolarates oxycodone per care cast  . Latex Shortness Of Breath  . Chocolate     migraines  . Onion Other (See Comments)    Causes migraine headaches  . Zyprexa [Olanzapine] Other (See Comments)    Confusion , dizzy,unsteady  . Adhesive [Tape] Other (See Comments)    blisters  . Heparin Rash    Can flush port with heparin  . Hydrocodone Rash    With extreme itching  . Iodinated Diagnostic Agents Hives, Itching, Rash and Other (See Comments)    Happened 60 years ago Stage IV kidney function  . Oxycodone Rash  . Promethazine-Dm Hives  . Sulfa Antibiotics Itching    Vital Signs: Temp: 98 F (36.7 C) (02/28 1100) Temp Source: Oral (02/28 1100) BP: 131/74 (02/28 1400) Pulse Rate: 64 (02/28 1400)  Labs: RecentLabs(last2labs)        Recent Labs    03/12/18 0820 03/13/18 1000 03/14/18 0202 03/14/18 1048  HGB 9.6* 7.1* 8.5*  --   HCT 30.9* 22.7* 27.0*  --   PLT 58* 85* 78*  --   CREATININE 2.39* 2.25* 2.35*  --   TROPONINI  --   --   --  0.21*      Estimated Creatinine Clearance: 21.3 mL/min (A) (by C-G formula based on SCr of 2.35 mg/dL (H)).   Assessment: 78 year old female on Eliquis for Afib. H/H stable, plts remain low.  Filter in place for confirmed DVT.   Age<80, wt>60, scr>1.5  Goal of Therapy:  Monitor platelets by anticoagulation protocol: Yes   Plan:  Eliquis 5 mg po BID Bmet/CBC q72h  Onnie Boer, PharmD, [place], AAHIVP, CPP Infectious Disease Pharmacist 03/21/2018 9:05 AM

## 2018-03-21 NOTE — Progress Notes (Addendum)
Physical Therapy Weekly Progress Note  Patient Details  Name: Nancy Norris MRN: [id number] Date of Birth: Jun 25, 1940  Beginning of progress report period: March 16, 2018 End of progress report period: March 21, 2018  Today's Date: 03/21/2018  Pt is making fair progress towards LTG's. Pt continues to be limited medically, which is impacting therapy sessions. Pt is currently able to ambulate with RW & CGA, demonstrating R inattention & visual issues, as well as expressive aphasia. Pt would benefit from continued skilled PT treatment to focus on safe functional mobility as well as for pt/family education, caregiver training, and d/c planning. This therapist is recommending pt d/c with 24 hr supervision due to cognitive & functional deficits.   Patient continues to demonstrate the following deficits muscle weakness, decreased cardiorespiratoy endurance, decreased coordination, decreased visual acuity and decreased visual perceptual skills, decreased attention to right, decreased awareness, decreased problem solving, decreased safety awareness, decreased memory and delayed processing, and decreased standing balance, decreased postural control and decreased balance strategies and therefore will continue to benefit from skilled PT intervention to increase functional independence with mobility.  Patient progressing toward long term goals..  Continue plan of care.  PT Short Term Goals Week 1:  PT Short Term Goal 1 (Week 1): = LTGs due to ELOS (supervision overall) Week 2:  PT Short Term Goal 1 (Week 2): STG = LTG due to estimated d/c date.    Therapy Documentation Precautions:  Precautions Precautions: Fall Precaution Comments: Portacath + fistula Lt forearm  Restrictions Weight Bearing Restrictions: No     Therapy/Group: Individual Therapy  [name] 03/21/2018, 9:37 AM

## 2018-03-21 NOTE — Progress Notes (Signed)
Speech Language Pathology Daily Session Note  Patient Details  Name: Nancy Norris MRN: [id number] Date of Birth: 11/20/1940  Today's Date: 03/21/2018   Skilled treatment session #1 SLP Individual Time: 0815-0825 SLP Individual Time Calculation (min): 10 min - pt missed 20 minutes d/t pain and MD speaking with pt and her daughter  Skilled treatment session #2 SLP Individual Time: 2952-8413 SLP Individual Time Calculation (Min): 30 min  Short Term Goals: Week 1: SLP Short Term Goal 1 (Week 1): Pt will complete functional basic problem solving tasks with Min A. SLP Short Term Goal 2 (Week 1): Pt will utilize word finding strategies to produce sentence length utterances with Mod A cues in 8 out of 10 opportunities.  SLP Short Term Goal 3 (Week 1): Pt will demonstrate selective attention for ~ 30 minutes in mildy distracting environment with Min A cues.   Skilled Therapeutic Interventions:  Skilled treatment session #1 focused on educaon with pt's daughter. SLP entered pt's room, pt in bed reporting 12 out of 10 headache pain, daughter present and Dr Naaman Plummer reviewing scan on computer. SLP provided information to pt's daughter on fluctuating effeciency of cognition. Education provided to daughter on Min A level goals for cognition and explanation of what Min A looks like within tasks at home. Barring any medical complications, daughter plans to spend 2 weeks at home with pt. This Probation officer explained that after that two period, pt is likely to require continued 24 hour supervision for safety when performing cognitive tasks. Pt agrees, daughter knodded her head and MD stated that pt might only require intermittent supervision and "that (level of supervision) is something that can be followed up with Outpatient."  Daughter with medical questions, SLP left and will attempt therapy at later time.    Skilled treatment session #2 focused on cognition goals. SLP received pt in bed after MRI with her daughter  present. Activities provided bed level d/t ongoing headache. Pt reports that medicine for pain has not be administered, this writer made nursing aware of new orders, MD in to check on pt, pt able to recall that one of her medicines is only scheduled at bedtime, nursing in to administer that medicine. Pt was oriented and didn't have any word finding deficits. Pt was subdued and mildly delayed but appropriate. Pt with difficulty perceiving medicine in her hand and had difficulty putting medicine in her mouth. All questions answered to pt and daughter's satisfaction. Pt left in bed with daughter.        Pain Pain Assessment Pain Scale: 0-10 Faces Pain Scale: Hurts little more Pain Type: Acute pain Pain Location: Head Pain Intervention(s): MD notified (Comment)  Therapy/Group: Individual Therapy  Nancy Norris 03/21/2018, 10:31 AM

## 2018-03-21 NOTE — Progress Notes (Signed)
STROKE TEAM BRIEF NOTE  Called by Dr. Tessa Lerner to review patient's repeat MRI due to neurological decline. MRI unfortunately shows multiple embolic strokes in anterior and posterior circulation which are likely related to recent hypercarbic 80 from cancer. This has happened despite patient being on eliquis. Patient recently had a complete stroke evaluation including TEE. Unfortunately there is  nothing much we can do at the present time and it may be worthwhile to discuss palliative care options with the family. Discussed with Dr. Tessa Lerner. Stroke team will be happy to discuss with family if needed. Kindly call if needed  Antony Contras, MD Medical Director [facility] Pager: [phone] 03/21/2018 2:32 PM

## 2018-03-21 NOTE — Progress Notes (Signed)
Occupational Therapy Session Note  Patient Details  Name: Nancy Norris MRN: [id number] Date of Birth: 06/19/1940  Today's Date: 03/21/2018 OT Individual Time: 1332-1410 OT Individual Time Calculation (min): 38 min  and Today's Date: 03/21/2018 OT Missed Time: 22 Minutes Missed Time Reason: Patient fatigue(headache)   Short Term Goals: Week 1:  OT Short Term Goal 1 (Week 1): STGs=LTGs due to ELOS  Skilled Therapeutic Interventions/Progress Updates:    Attempted to engage pt in therapy session with focus on self-care retraining and safe mobility.  Pt received reclined in bed with daughter at bedside preparing to leave.  Pt declined OOB activity as she reports "I can't see".  Engaged in discussion with pt and daughter regarding events of last night to further assess visual deficits.  [name] daughter departed.  Therapist attempted to further assess visual impairments.  Pt required increased time to turn on phone to locate time with pt stating 8:48 in stead of 1:48.  Pt unable to locate clock on wall, looking towards ceiling, and then when therapist pointed to it, pt stated she could not read it.  Pt able to read large headlines on newspaper when all in one line, however when multiple lines pt would skip words in random sequence.  Pt able to track stimulus horizontally but not vertically.  Pt requires increased time to initiate any movement or response.  Pt requested water, utilized BUE to grasp cup requiring increased time and guidance from therapist to bring straw to mouth.  Pt reaching up towards Rt side of head, indicating headache.  Pt closing eyes and requesting to rest.  Pt missed remaining 22 mins of therapy session.  PA and RN aware of pt current status.  Therapy Documentation Precautions:  Precautions Precautions: Fall Precaution Comments: Portacath + fistula Lt forearm  Restrictions Weight Bearing Restrictions: No General: General OT Amount of Missed Time: 22 Minutes Pain:  Pt  reports headache, requesting to rest.  RN notified.   Therapy/Group: Individual Therapy  [name] 03/21/2018, 2:28 PM

## 2018-03-21 NOTE — Progress Notes (Signed)
Patient's daughter was called & updated. Patient was easily aroused this morning & was responsive, No c/o pain or headache. Will continue to monitor

## 2018-03-22 ENCOUNTER — Inpatient Hospital Stay (HOSPITAL_COMMUNITY): Payer: Medicare Other

## 2018-03-22 ENCOUNTER — Inpatient Hospital Stay (HOSPITAL_COMMUNITY): Payer: Medicare Other | Admitting: Occupational Therapy

## 2018-03-22 DIAGNOSIS — R531 Weakness: Secondary | ICD-10-CM

## 2018-03-22 DIAGNOSIS — C801 Malignant (primary) neoplasm, unspecified: Secondary | ICD-10-CM

## 2018-03-22 DIAGNOSIS — E876 Hypokalemia: Secondary | ICD-10-CM

## 2018-03-22 DIAGNOSIS — K5901 Slow transit constipation: Secondary | ICD-10-CM

## 2018-03-22 DIAGNOSIS — R112 Nausea with vomiting, unspecified: Secondary | ICD-10-CM

## 2018-03-22 DIAGNOSIS — D696 Thrombocytopenia, unspecified: Secondary | ICD-10-CM

## 2018-03-22 MED ORDER — TRAMADOL HCL 50 MG PO TABS
25.0000 mg | ORAL_TABLET | ORAL | Status: DC | PRN
  Administered 2018-03-22 – 2018-03-24 (×4): 25 mg via ORAL
  Filled 2018-03-22 (×4): qty 1

## 2018-03-22 MED ORDER — ONDANSETRON HCL 4 MG/2ML IJ SOLN
2.0000 mg | INTRAMUSCULAR | Status: DC | PRN
  Administered 2018-03-22 – 2018-03-24 (×7): 2 mg via INTRAVENOUS
  Filled 2018-03-22 (×7): qty 2

## 2018-03-22 MED ORDER — ONDANSETRON HCL 4 MG/2ML IJ SOLN
4.0000 mg | Freq: Four times a day (QID) | INTRAMUSCULAR | Status: DC | PRN
  Administered 2018-03-22: 4 mg via INTRAVENOUS
  Filled 2018-03-22: qty 2

## 2018-03-22 MED ORDER — ONDANSETRON HCL 4 MG/2ML IJ SOLN: 2.0000 mg | Freq: Four times a day (QID) | INTRAMUSCULAR | Status: DC | PRN

## 2018-03-22 MED ORDER — SODIUM CHLORIDE 0.9% FLUSH: 10.0000 mL | INTRAVENOUS | Status: DC | PRN

## 2018-03-22 NOTE — Progress Notes (Signed)
[place] PHYSICAL MEDICINE & REHABILITATION PROGRESS NOTE   Subjective/Complaints: Patient seen laying in bed this morning.  She is somnolent after having just received dose of IV Zofran.  Called by nursing overnight regarding headaches and later nausea and vomiting and abdominal pain.  Later informed by nursing that family does not want palliative care.  ROS: Unable to obtain due to somnolence.    Objective:   Ct Abdomen Pelvis Wo Contrast  Result Date: 03/22/2018 CLINICAL DATA:  Diffuse abdominal pain with nausea and vomiting. EXAM: CT ABDOMEN AND PELVIS WITHOUT CONTRAST TECHNIQUE: Multidetector CT imaging of the abdomen and pelvis was performed following the standard protocol without IV contrast. COMPARISON:  CT abdomen pelvis dated March 08, 2018. FINDINGS: Lower chest: No acute abnormality. Hepatobiliary: 2.7 cm hypodense lesion in the left hepatic lobe, slightly more conspicuous on today's study. Status post cholecystectomy. No biliary dilatation. Pancreas: Mild atrophy. No ductal dilatation or surrounding inflammatory changes. Spleen: Unchanged 2.5 cm hypodense lesion in the dome of the spleen. There is also likely a 2.2 cm hypodense lesion in the inferior spleen which is obscured by streak artifact on prior study. Adrenals/Urinary Tract: Unchanged 1.6 cm left adrenal nodule since September 2017, likely an adenoma. Right adrenal gland is unremarkable. Unchanged cortical scarring and atrophy of both kidneys. No focal renal lesion, calculi, or hydronephrosis. Stomach/Bowel: Unchanged small hiatal hernia. Unchanged wall thickening of the gastroesophageal junction and medial proximal stomach. No small bowel or colonic wall thickening, distention, or surrounding inflammatory changes. The appendix is not visualized, but there are no signs of inflammation at the base of the cecum. Vascular/Lymphatic: Increased bulky lymphadenopathy in the gastrohepatic ligament, now measuring 5.4 x 3.3 cm,  previously 4.5 x 3.2 cm. Other small nearby lymph nodes now measure 13-14 mm, previously 10-12 mm. Small portacaval lymph node has increased in size, now measuring 12 mm, previously 10 mm. Interval placement of an IVC filter with the tip just below the left renal vein. Aortoiliac atherosclerosis. Reproductive: Status post hysterectomy. No adnexal masses. Other: No abdominal wall hernia or abnormality. No abdominopelvic ascites. No pneumoperitoneum. Musculoskeletal: No acute or significant osseous findings. IMPRESSION: 1.  No acute intra-abdominal process. 2. Grossly unchanged wall thickening involving the gastroesophageal junction and medial proximal stomach, consistent with known gastric adenocarcinoma. Slightly increased bulky lymphadenopathy in the gastrohepatic ligament. 3. 2.7 cm slightly hypodense lesion in the left hepatic lobe. 2.5 and 2.2 cm hypodense lesions in the spleen. These are not well evaluated due to lack of intravenous contrast. Metastatic disease is not excluded. Electronically Signed   By: Titus Dubin M.D.   On: 03/22/2018 09:01   Dg Chest 2 View  Result Date: 03/20/2018 CLINICAL DATA:  LEFT-sided weakness. EXAM: CHEST - 2 VIEW COMPARISON:  Chest x-ray dated 03/03/2018. FINDINGS: Heart size and mediastinal contours are stable. RIGHT chest wall Port-A-Cath is stable in position with tip at the level of the lower SVC/cavoatrial junction. Mild scarring/atelectasis again noted within the LEFT mid lung. Lungs are otherwise clear. No pleural effusion or pneumothorax seen. No acute or suspicious osseous finding. IMPRESSION: No active cardiopulmonary disease. No evidence of pneumonia or pulmonary edema. Electronically Signed   By: Franki Cabot M.D.   On: 03/20/2018 19:52   Ct Head Wo Contrast  Result Date: 03/20/2018 CLINICAL DATA:  78 y/o F; Headache, acute, severe, worst HA of life. EXAM: CT HEAD WITHOUT CONTRAST TECHNIQUE: Contiguous axial images were obtained from the base of the skull  through the vertex without intravenous contrast. COMPARISON:  03/13/2018 MRI of the head. 03/12/2018 CTA of the head. FINDINGS: Brain: Infarcts within the left parietal lobe and basal ganglia as well as very small infarcts in the right parietal lobe stable in distribution from prior MRI with interval decrease in local edema and mass effect. Small chronic infarcts are present in the left occipital lobe and right superior cerebellum. Stable background of moderate chronic microvascular ischemic changes and volume loss of brain. No new acute stroke, hemorrhage, extra-axial collection, hydrocephalus, or mass effect. Vascular: Calcific atherosclerosis of carotid siphons. No hyperdense vessel identified. Skull: Normal. Negative for fracture or focal lesion. Sinuses/Orbits: No acute finding. Other: None. IMPRESSION: 1. No acute intracranial abnormality identified. 2. Stable distribution of recent infarcts in the left-greater-than-right parietal lobe and left basal ganglia from prior MRI. Decreased edema and local mass effect. 3. Stable background of moderate chronic microvascular ischemic changes, parenchymal volume loss of the brain, and chronic infarctions. Electronically Signed   By: Kristine Garbe M.D.   On: 03/20/2018 19:09   Mr Brain Wo Contrast  Result Date: 03/21/2018 CLINICAL DATA:  78 y/o F; headache, lethargy, word-finding deficit. Persistent severe headache. EXAM: MRI HEAD WITHOUT CONTRAST TECHNIQUE: Multiplanar, multiecho pulse sequences of the brain and surrounding structures were obtained without intravenous contrast. COMPARISON:  03/20/2018 CT head.  03/13/2018 MRI and MRA head. FINDINGS: Brain: Interval development of several punctate foci of reduced diffusion scattered throughout the posterior frontal, parietal, and right occipital cortices compatible with acute/early subacute infarction. Small additional areas of acute/early subacute infarction are present within the right posterior thalamus,  right posterior hippocampus, left medial lentiform nucleus, the left insula (series 6, image 21). No associated hemorrhage or mass effect. Recent infarcts identified on the prior MRI of the head within left-greater-than-right parietal lobes and the left basal ganglia no demonstrate intermediate to mildly increased diffusion compatible with subacute etiology. Small chronic infarctions are present within the left superior cerebellum, left occipital lobe, left thalamus. Stable background of early confluent nonspecific T2 FLAIR hyperintensities in subcortical and periventricular white matter are compatible with moderate chronic microvascular ischemic changes. Stable moderate volume loss of the brain. No extra-axial collection, hydrocephalus, mass effect, or herniation. Vascular: Normal flow voids. Skull and upper cervical spine: Normal marrow signal. Sinuses/Orbits: Negative. Other: Bilateral intra-ocular lens replacement. IMPRESSION: 1. Several punctate acute/early subacute infarction throughout bilateral posterior frontal, bilateral parietal, and right parietal cortices. Additional small foci of acute/early subacute infarction within right posterior thalamus, right posterior hippocampus, left medial lentiform nucleus, and left insula. No associated hemorrhage or mass effect. Multiple vascular territories suggests embolic etiology. 2. Recent infarcts identified on the prior MRI of the head within left-greater-than-right parietal lobes in the left basal ganglia now demonstrate subacute signal. 3. Stable background of moderate chronic microvascular ischemic changes, volume loss of the brain, and small chronic infarctions. These results will be called to the ordering clinician or representative by the Radiologist Assistant, and communication documented in the PACS or zVision Dashboard. Electronically Signed   By: Kristine Garbe M.D.   On: 03/21/2018 13:56   Recent Labs    03/20/18 0525  WBC 5.2  HGB 9.0*   HCT 28.6*  PLT 52*   Recent Labs    03/20/18 0525  NA 137  K 3.8  CL 108  CO2 22  GLUCOSE 116*  BUN 16  CREATININE 2.59*  CALCIUM 8.9    Intake/Output Summary (Last 24 hours) at 03/22/2018 1026 Last data filed at 03/21/2018 2300 Gross per 24 hour  Intake 360 ml  Output -  Net 360 ml     Physical Exam: Vital Signs Blood pressure 130/85, pulse 91, temperature 98.8 F (37.1 C), temperature source Oral, resp. rate 18, height '5\' 5"'$  (1.651 m), weight 75 kg, SpO2 100 %.   Constitutional: Previously distressed. Vital signs reviewed. HENT: Normocephalic.  Atraumatic. Eyes: EOMI. No discharge. Cardiovascular: RRR. No JVD. Respiratory: CTA Bilaterally. Normal effort. GI: BS +. Non-distended. Musc: No edema or tenderness in extremities. Skin: No evidence of breakdown, no evidence of rash Neurologic: Somnolent Difficulty following commands   Assessment/Plan: 1. Functional deficits secondary to Left MCA infarct which require 3+ hours per day of interdisciplinary therapy in a comprehensive inpatient rehab setting.  Physiatrist is providing close team supervision and 24 hour management of active medical problems listed below.  Physiatrist and rehab team continue to assess barriers to discharge/monitor patient progress toward functional and medical goals  Care Tool:  Bathing    Body parts bathed by patient: Right arm, Left arm, Chest, Abdomen, Front perineal area, Buttocks, Right upper leg, Left upper leg, Face, Right lower leg, Left lower leg         Bathing assist Assist Level: Minimal Assistance - Patient > 75%(for standing balance)     Upper Body Dressing/Undressing Upper body dressing   What is the patient wearing?: Pull over shirt    Upper body assist Assist Level: Minimal Assistance - Patient > 75%    Lower Body Dressing/Undressing Lower body dressing      What is the patient wearing?: Underwear/pull up, Pants     Lower body assist Assist for lower body  dressing: Minimal Assistance - Patient > 75%     Toileting Toileting Toileting Activity did not occur (Clothing management and hygiene only): N/A (no void or bm)  Toileting assist Assist for toileting: Minimal Assistance - Patient > 75% Assistive Device Comment: (Front wheel walker)   Transfers Chair/bed transfer  Transfers assist     Chair/bed transfer assist level: 2 Helpers     Locomotion Ambulation   Ambulation assist      Assist level: Minimal Assistance - Patient > 75% Assistive device: Walker-rolling Max distance: 180   Walk 10 feet activity   Assist  Walk 10 feet activity did not occur: Refused(due to pain)  Assist level: Minimal Assistance - Patient > 75% Assistive device: Walker-rolling   Walk 50 feet activity   Assist Walk 50 feet with 2 turns activity did not occur: Refused(due to pain)  Assist level: Minimal Assistance - Patient > 75% Assistive device: Walker-rolling    Walk 150 feet activity   Assist Walk 150 feet activity did not occur: Refused(due to pain)  Assist level: Minimal Assistance - Patient > 75% Assistive device: Walker-rolling    Walk 10 feet on uneven surface  activity   Assist Walk 10 feet on uneven surfaces activity did not occur: Refused         Wheelchair     Assist Will patient use wheelchair at discharge?: No      Wheelchair assist level: Dependent - Patient 0%      Wheelchair 50 feet with 2 turns activity    Assist    Wheelchair 50 feet with 2 turns activity did not occur: N/A       Wheelchair 150 feet activity     Assist Wheelchair 150 feet activity did not occur: N/A        Medical Problem List and Plan: 1.Confusion/altered mental status with gait disorder and speech difficultysecondary to  left MCA infarction due to left M2 high-grade stenosis status post revascularizationembolic pattern likely secondary to hypercoagulable state  Repeat MRI showing multiple embolic strokes,  neurology note reviewed  -'25mg'$  topamax for headache relief   -tramadol for severe headache  Family does not want palliative care 2. Antithrombotics: -DVT/anticoagulation:SCDs.+ DOAC -antiplatelet therapy: ELIQUIS 3. Pain Management:Tylenol as needed  Epigastric pain likely due to gastric adeno carcinoma relieved with GI cocktail  Repeat CT abd/Pelvis ordered, stable  -see h/a rx above 4. Mood:Wellbutrin 75 mg twice a day, Celexa 40 mg daily. Provide emotional support -antipsychotic agents: None  States she took something at home for anxiety, already on Wellbutrin and Celexa  5. Neuropsych: This patientiscapable of making decisions on herown behalf. 6. Skin/Wound Care:Routine skin checks 7. Fluids/Electrolytes/Nutrition:I personally reviewed the patient's labs today.   8. Poorly differentiated adenocarcinoma the stomach as well as history of multiple myeloma and remote breast cancer. Patient  to follow up outpatient with oncology services Dr. Earlie Server 9. COPD with remote tobacco use. Continue nebulizer treatments as directed 10. Chronic anemia/thrombocytopenia. Patient follow-up outpatient with hematology oncology. Transfuse as needed  Labs ordered for Monday 11. Orthostatic hypotension. ProAmatine 10 mg daily, bp's controlled 12. CKD stage IV. Creatinine baseline 2.29-2.93.   Creatinine 2.59 on 3/5 13. Hypothyroidism. Synthroid 14. GERD. Protonix 15. Hyperlipidemia. Lipitor  16.  Constipation- added senna  Needs to have a BM 17.  HypoK KCL 21mq BID started 3/2  -recheck K+ 3.8 on 3/5  Labs ordered for Monday   LOS: 7 days A FACE TO FACE EVALUATION WAS PERFORMED  Nancy Norris ALorie Phenix3/07/2018, 10:26 AM

## 2018-03-22 NOTE — Progress Notes (Signed)
Complained of HA. Requested tylenol before due, paged Dr. Posey Pronto. PRN tylenol given at 2224. Rested quietly until 0500, patient reports waking feeling nauseated and with abdominal pain. Paged Dr. Posey Pronto, orders for zofran IV and stat CT of abd and pelvis. Zofran given, awaiting CT scan. Will continue to monitor.Nancy Norris

## 2018-03-22 NOTE — Progress Notes (Signed)
Physical Therapy Session Note  Patient Details  Name: Nancy Norris MRN: [id number] Date of Birth: 02/03/1940  Today's Date: 03/22/2018 PT Missed Time: 37 Minutes Missed Time Reason: Patient fatigue;Patient ill (Comment)(nausea, vomitting, dizziness)  Short Term Goals: Week 1:  PT Short Term Goal 1 (Week 1): = LTGs due to ELOS (supervision overall)  Skilled Therapeutic Interventions/Progress Updates:    Pt supine in bed upon PT arrival, daughter reports recent episodes of vomiting nausea this morning. Also waiting to hear back from recent CT scan done this AM. Pt declines therapy secondary to nausea and dizziness, pt left supine with needs in reach and daughter present, bed alarm set.   Pt missed 60 minutes of therapy tx secondary to nausea, vomiting and dizziness.   Therapy Documentation Precautions:  Precautions Precautions: Fall Precaution Comments: Portacath + fistula Lt forearm  Restrictions Weight Bearing Restrictions: No  Therapy/Group: Individual Therapy  Netta Corrigan, PT, DPT 03/22/2018, 7:56 AM

## 2018-03-22 NOTE — Progress Notes (Signed)
Patient c/o "going to pass out " with therapy. C/o nausea antiemetic was given an hour ago. Patient back in bed vital signs taken WNL. Patient went to CT.

## 2018-03-22 NOTE — Progress Notes (Signed)
Occupational Therapy Session Note  Patient Details  Name: Nancy Norris MRN: [id number] Date of Birth: 28-May-1940  Today's Date: 03/22/2018 OT Individual Time: 5110-2111 OT Individual Time Calculation (min): 36 min    Skilled Therapeutic Interventions/Progress Updates: Upon approach for OT, RN stated patient was heading downstairs for CT but needed to toilet.  As well,she was speaking with patient and assessing her complaints of severe nausea and weakness that evidently started much earlier in the am or yesterday?.   Patient requesting to toilet.  Because of patient making groaning sounds and c/o of weakness and patient holding mouth nauseated with frowns and eyes partially closed and hands out as if she were trying to find her way and stating that she could not see very well at all,      She required for bed to w/c transfer +2 moderate extra time transfer for safety (she completed a standpivot with extra time and tactile verbal cues to locate and reach for her chair.  W/c to/fr toilet transfers = verbal and tactile cues to locate grab bar and toilet.   Patient still stating she was nauseated and could not see very well=Min A stand pivot and extra time.  Toileting (wearing hospital gown only) required Min A to thoroughly was buttocks after voiding.  Toilet to w/c transfer via grab bar due to c/o weakness as well as nauseated= Min A stand pivot and extra time  After washing hands, she began to slump in chair somewhat and when asked if she was ok, she stated again she was weak and nauseated.   RN alerted.  After giving patient emesis bag, she straightened up and stated she was still nauseated and weak and kept eye closed until it was time to transfer to the bed. W/c to bed transfer = moderate assist with cues to locate bed with hands and transportation tech standing nearby to assist with patiet sitting back onto bed.  RN came in again to assess patient and all vital signs appeared stable.      Patient groaned but was assessed as stable enough to go for CT scan.     Therapy Documentation Precautions:  Precautions Precautions: Fall Precaution Comments: Portacath + fistula Lt forearm  Restrictions Weight Bearing Restrictions: No General: General OT Amount of Missed Time: 39 Minutes(39) Pain:denied Vision: patient stated she could not see well this am during the nausea (for the whole session before leaving for CT scan)   Therapy/Group: Individual Therapy  [name] [facility] 03/22/2018, 8:18 AM

## 2018-03-22 NOTE — Progress Notes (Signed)
Occupational Therapy Note  Patient Details  Name: Nancy Norris MRN: [id number] Date of Birth: 04-19-1940  Upon later approach to offer OT, patient was sleeping.   Per nurse, patient still complains this afternoon of not feeling well and nausea.  OT will offer therapy the next scheduled date.    [name] [facility] 03/22/2018, 4:24 PM

## 2018-03-22 NOTE — Progress Notes (Signed)
Patient does not have any appetite; did not eat her meals only asks for water; continouos to c/o pain on her abdomen with nausea  relieved by tylenol , zofran and mylanta. Patient claims her vision is blurry still. Continued to monitor patient.Marland Kitchen

## 2018-03-23 ENCOUNTER — Inpatient Hospital Stay (HOSPITAL_COMMUNITY): Payer: Medicare Other | Admitting: Physical Therapy

## 2018-03-23 ENCOUNTER — Inpatient Hospital Stay (HOSPITAL_COMMUNITY): Payer: Medicare Other | Admitting: Occupational Therapy

## 2018-03-23 DIAGNOSIS — I951 Orthostatic hypotension: Secondary | ICD-10-CM

## 2018-03-23 DIAGNOSIS — N184 Chronic kidney disease, stage 4 (severe): Secondary | ICD-10-CM

## 2018-03-23 DIAGNOSIS — D649 Anemia, unspecified: Secondary | ICD-10-CM

## 2018-03-23 LAB — BASIC METABOLIC PANEL
ANION GAP: 10 (ref 5–15)
BUN: 28 mg/dL — ABNORMAL HIGH (ref 8–23)
CO2: 20 mmol/L — ABNORMAL LOW (ref 22–32)
Calcium: 8.1 mg/dL — ABNORMAL LOW (ref 8.9–10.3)
Chloride: 105 mmol/L (ref 98–111)
Creatinine, Ser: 2.49 mg/dL — ABNORMAL HIGH (ref 0.44–1.00)
GFR calc Af Amer: 21 mL/min — ABNORMAL LOW (ref >60)
GFR calc non Af Amer: 18 mL/min — ABNORMAL LOW (ref >60)
Glucose, Bld: 99 mg/dL (ref 70–99)
Potassium: 3.1 mmol/L — ABNORMAL LOW (ref 3.5–5.1)
SODIUM: 135 mmol/L (ref 135–145)

## 2018-03-23 LAB — CBC
HCT: 25.1 % — ABNORMAL LOW (ref 36.0–46.0)
Hemoglobin: 8.1 g/dL — ABNORMAL LOW (ref 12.0–15.0)
MCH: 33.3 pg (ref 26.0–34.0)
MCHC: 32.3 g/dL (ref 30.0–36.0)
MCV: 103.3 fL — ABNORMAL HIGH (ref 80.0–100.0)
Platelets: 58 10*3/uL — ABNORMAL LOW (ref 150–400)
RBC: 2.43 MIL/uL — ABNORMAL LOW (ref 3.87–5.11)
RDW: 22.2 % — ABNORMAL HIGH (ref 11.5–15.5)
WBC: 7.8 10*3/uL (ref 4.0–10.5)
nRBC: 0 % (ref 0.0–0.2)

## 2018-03-23 MED ORDER — POTASSIUM CHLORIDE CRYS ER 20 MEQ PO TBCR
20.0000 meq | EXTENDED_RELEASE_TABLET | Freq: Two times a day (BID) | ORAL | Status: DC
  Administered 2018-03-23 – 2018-03-25 (×5): 20 meq via ORAL
  Filled 2018-03-23 (×6): qty 1

## 2018-03-23 MED ORDER — BISACODYL 10 MG RE SUPP: 10.0000 mg | Freq: Every day | RECTAL | Status: DC | PRN

## 2018-03-23 MED ORDER — POLYETHYLENE GLYCOL 3350 17 G PO PACK
17.0000 g | PACK | Freq: Two times a day (BID) | ORAL | Status: DC
  Administered 2018-03-23: 17 g via ORAL
  Filled 2018-03-23 (×5): qty 1

## 2018-03-23 MED ORDER — SODIUM CHLORIDE 0.9 % IV SOLN
INTRAVENOUS | Status: DC
  Administered 2018-03-23 – 2018-03-24 (×4): via INTRAVENOUS

## 2018-03-23 NOTE — Progress Notes (Signed)
Patient continued to complain of pain on her abdomen with occasional nausea both relieved with medications. Patient had a bowel movement today. Patient refused to eat offered ensure, yogurt today she was able to finish her ensure 1 bottle offering it several times a day and bites of yogurt in the morning. Patient drinks her water.

## 2018-03-23 NOTE — Progress Notes (Signed)
Per daughter they are willing to discuss palliative care tomorrow she will be here in the morning to meet with the MD.

## 2018-03-23 NOTE — Progress Notes (Signed)
ANTICOAGULATION CONSULT NOTE - Follow Up Consult  Pharmacy Consult for Eliquis  Indication: Afib and CVA  Allergies  Allergen Reactions  . Codeine Anaphylaxis    anaphylasix tolarates oxycodone per care cast  . Latex Shortness Of Breath  . Chocolate     migraines  . Onion Other (See Comments)    Causes migraine headaches  . Zyprexa [Olanzapine] Other (See Comments)    Confusion , dizzy,unsteady  . Adhesive [Tape] Other (See Comments)    blisters  . Heparin Rash    Can flush port with heparin  . Hydrocodone Rash    With extreme itching  . Iodinated Diagnostic Agents Hives, Itching, Rash and Other (See Comments)    Happened 60 years ago Stage IV kidney function  . Oxycodone Rash  . Promethazine-Dm Hives  . Sulfa Antibiotics Itching    Patient Measurements: Height: 5\' 5"  (165.1 cm) Weight: 165 lb 5.5 oz (75 kg) IBW/kg (Calculated) : 57  Vital Signs: Temp: 97.7 F (36.5 C) (03/08 0512) Temp Source: Oral (03/08 0512) BP: 103/60 (03/08 0512) Pulse Rate: 79 (03/08 0512)  Labs: Recent Labs    03/23/18 0334  HGB 8.1*  HCT 25.1*  PLT 58*  CREATININE 2.49*    Estimated Creatinine Clearance: 19.5 mL/min (A) (by C-G formula based on SCr of 2.49 mg/dL (H)).   Assessment: Anticoag: None PTA - acute DVT 2/21, s/p IVC filter on eliquis 5 mg BID.  Hgb 9>8.1. Plts only 58. Now with new infarcts on MRI. - SCr 2.49 but Age <80, Wt >60 - dose ok  Goal of Therapy:  Therapeutic oral anticoagulation   Plan:  Apixaban 5mg  PO BID  F/u K+ supplementation for K+ 3.1   Oluwadamilola Deliz S. Alford Highland, PharmD, BCPS Clinical Staff Pharmacist Eilene Ghazi Stillinger 03/23/2018,7:54 AM

## 2018-03-23 NOTE — Progress Notes (Signed)
Physical Therapy Session Note  Patient Details  Name: Nancy Norris MRN: [id number] Date of Birth: 23-Jan-1940  Today's Date: 03/23/2018 PT Individual Time: 0800-0800 PT Individual Time Calculation (min): 0 min   Short Term Goals: Week 1:  PT Short Term Goal 1 (Week 1): = LTGs due to ELOS (supervision overall)  Skilled Therapeutic Interventions/Progress Updates:  Attempted to see pt in the am. Upon entering room, pt moaning in pain. Pt c/o 10/10 abd pain and nausea. Vitals taken. Notified pt's nurse in regards to abd pain and nausea. Pt positioned in bed for comfort and left with call bell within reach and bed alarm on. Treatment ended.   Therapy Documentation Precautions:  Precautions Precautions: Fall Precaution Comments: Portacath + fistula Lt forearm  Restrictions Weight Bearing Restrictions: No General: PT Amount of Missed Time (min): 60 Minutes PT Missed Treatment Reason: Patient ill (Comment);Patient fatigue;Pain(c/o nausea, 10/10 abd pain) Vital Signs: Therapy Vitals Pulse Rate: 77 BP: 118/75 Patient Position (if appropriate): Lying Oxygen Therapy SpO2: 97 % O2 Device: Room Air Pain: Pain Assessment Pain Scale: 0-10 Pain Score: 10-Worst pain ever Pain Type: Acute pain Pain Location: Abdomen  Therapy/Group: Individual Therapy  Dub Amis 03/23/2018, 8:25 AM

## 2018-03-23 NOTE — Progress Notes (Signed)
[place] PHYSICAL MEDICINE & REHABILITATION PROGRESS NOTE   Subjective/Complaints: Patient seen sitting up in bed this morning.  She states she slept well overnight.  She denies complaints this morning.  Per nursing, patient has not had a bowel movement in several days.  ROS: Denies CP, SOB, N/V/D  Objective:   Ct Abdomen Pelvis Wo Contrast  Result Date: 03/22/2018 CLINICAL DATA:  Diffuse abdominal pain with nausea and vomiting. EXAM: CT ABDOMEN AND PELVIS WITHOUT CONTRAST TECHNIQUE: Multidetector CT imaging of the abdomen and pelvis was performed following the standard protocol without IV contrast. COMPARISON:  CT abdomen pelvis dated March 08, 2018. FINDINGS: Lower chest: No acute abnormality. Hepatobiliary: 2.7 cm hypodense lesion in the left hepatic lobe, slightly more conspicuous on today's study. Status post cholecystectomy. No biliary dilatation. Pancreas: Mild atrophy. No ductal dilatation or surrounding inflammatory changes. Spleen: Unchanged 2.5 cm hypodense lesion in the dome of the spleen. There is also likely a 2.2 cm hypodense lesion in the inferior spleen which is obscured by streak artifact on prior study. Adrenals/Urinary Tract: Unchanged 1.6 cm left adrenal nodule since September 2017, likely an adenoma. Right adrenal gland is unremarkable. Unchanged cortical scarring and atrophy of both kidneys. No focal renal lesion, calculi, or hydronephrosis. Stomach/Bowel: Unchanged small hiatal hernia. Unchanged wall thickening of the gastroesophageal junction and medial proximal stomach. No small bowel or colonic wall thickening, distention, or surrounding inflammatory changes. The appendix is not visualized, but there are no signs of inflammation at the base of the cecum. Vascular/Lymphatic: Increased bulky lymphadenopathy in the gastrohepatic ligament, now measuring 5.4 x 3.3 cm, previously 4.5 x 3.2 cm. Other small nearby lymph nodes now measure 13-14 mm, previously 10-12 mm. Small  portacaval lymph node has increased in size, now measuring 12 mm, previously 10 mm. Interval placement of an IVC filter with the tip just below the left renal vein. Aortoiliac atherosclerosis. Reproductive: Status post hysterectomy. No adnexal masses. Other: No abdominal wall hernia or abnormality. No abdominopelvic ascites. No pneumoperitoneum. Musculoskeletal: No acute or significant osseous findings. IMPRESSION: 1.  No acute intra-abdominal process. 2. Grossly unchanged wall thickening involving the gastroesophageal junction and medial proximal stomach, consistent with known gastric adenocarcinoma. Slightly increased bulky lymphadenopathy in the gastrohepatic ligament. 3. 2.7 cm slightly hypodense lesion in the left hepatic lobe. 2.5 and 2.2 cm hypodense lesions in the spleen. These are not well evaluated due to lack of intravenous contrast. Metastatic disease is not excluded. Electronically Signed   By: Titus Dubin M.D.   On: 03/22/2018 09:01   Mr Brain Wo Contrast  Result Date: 03/21/2018 CLINICAL DATA:  78 y/o F; headache, lethargy, word-finding deficit. Persistent severe headache. EXAM: MRI HEAD WITHOUT CONTRAST TECHNIQUE: Multiplanar, multiecho pulse sequences of the brain and surrounding structures were obtained without intravenous contrast. COMPARISON:  03/20/2018 CT head.  03/13/2018 MRI and MRA head. FINDINGS: Brain: Interval development of several punctate foci of reduced diffusion scattered throughout the posterior frontal, parietal, and right occipital cortices compatible with acute/early subacute infarction. Small additional areas of acute/early subacute infarction are present within the right posterior thalamus, right posterior hippocampus, left medial lentiform nucleus, the left insula (series 6, image 21). No associated hemorrhage or mass effect. Recent infarcts identified on the prior MRI of the head within left-greater-than-right parietal lobes and the left basal ganglia no demonstrate  intermediate to mildly increased diffusion compatible with subacute etiology. Small chronic infarctions are present within the left superior cerebellum, left occipital lobe, left thalamus. Stable background of early confluent nonspecific  T2 FLAIR hyperintensities in subcortical and periventricular white matter are compatible with moderate chronic microvascular ischemic changes. Stable moderate volume loss of the brain. No extra-axial collection, hydrocephalus, mass effect, or herniation. Vascular: Normal flow voids. Skull and upper cervical spine: Normal marrow signal. Sinuses/Orbits: Negative. Other: Bilateral intra-ocular lens replacement. IMPRESSION: 1. Several punctate acute/early subacute infarction throughout bilateral posterior frontal, bilateral parietal, and right parietal cortices. Additional small foci of acute/early subacute infarction within right posterior thalamus, right posterior hippocampus, left medial lentiform nucleus, and left insula. No associated hemorrhage or mass effect. Multiple vascular territories suggests embolic etiology. 2. Recent infarcts identified on the prior MRI of the head within left-greater-than-right parietal lobes in the left basal ganglia now demonstrate subacute signal. 3. Stable background of moderate chronic microvascular ischemic changes, volume loss of the brain, and small chronic infarctions. These results will be called to the ordering clinician or representative by the Radiologist Assistant, and communication documented in the PACS or zVision Dashboard. Electronically Signed   By: Kristine Garbe M.D.   On: 03/21/2018 13:56   Recent Labs    03/23/18 0334  WBC 7.8  HGB 8.1*  HCT 25.1*  PLT 58*   Recent Labs    03/23/18 0334  NA 135  K 3.1*  CL 105  CO2 20*  GLUCOSE 99  BUN 28*  CREATININE 2.49*  CALCIUM 8.1*    Intake/Output Summary (Last 24 hours) at 03/23/2018 9390 Last data filed at 03/22/2018 2300 Gross per 24 hour  Intake 240 ml   Output -  Net 240 ml     Physical Exam: Vital Signs Blood pressure 103/60, pulse 79, temperature 97.7 F (36.5 C), temperature source Oral, resp. rate 18, height _0  (1.651 m), weight 75 kg, SpO2 99 %.   Constitutional: NAD. Vital signs reviewed. HENT: Normocephalic.  Atraumatic. Eyes: EOMI. No discharge. Cardiovascular: RRR.  No JVD. Respiratory: CTA bilaterally.  Normal effort. GI: BS +. Non-distended. Musc: No edema or tenderness in extremities. Skin: No evidence of breakdown, no evidence of rash Neurologic: Alert Motor: 4+/5 RUE/RLE  5/5 LUE/LLE.  Psych: Flat.  Assessment/Plan: 1. Functional deficits secondary to Left MCA infarct which require 3+ hours per day of interdisciplinary therapy in a comprehensive inpatient rehab setting.  Physiatrist is providing close team supervision and 24 hour management of active medical problems listed below.  Physiatrist and rehab team continue to assess barriers to discharge/monitor patient progress toward functional and medical goals  Care Tool:  Bathing    Body parts bathed by patient: Right arm, Left arm, Chest, Abdomen, Front perineal area, Buttocks, Right upper leg, Left upper leg, Face, Right lower leg, Left lower leg         Bathing assist Assist Level: Minimal Assistance - Patient > 75%(for standing balance)     Upper Body Dressing/Undressing Upper body dressing   What is the patient wearing?: Pull over shirt    Upper body assist Assist Level: Minimal Assistance - Patient > 75%    Lower Body Dressing/Undressing Lower body dressing      What is the patient wearing?: Underwear/pull up, Pants     Lower body assist Assist for lower body dressing: Minimal Assistance - Patient > 75%     Toileting Toileting Toileting Activity did not occur (Clothing management and hygiene only): N/A (no void or bm)  Toileting assist Assist for toileting: Minimal Assistance - Patient > 75% Assistive Device Comment: (Front wheel  walker)   Transfers Chair/bed transfer  Transfers assist  Chair/bed transfer assist level: 2 Helpers     Locomotion Ambulation   Ambulation assist      Assist level: Minimal Assistance - Patient > 75% Assistive device: Walker-rolling Max distance: 180   Walk 10 feet activity   Assist  Walk 10 feet activity did not occur: Refused(due to pain)  Assist level: Minimal Assistance - Patient > 75% Assistive device: Walker-rolling   Walk 50 feet activity   Assist Walk 50 feet with 2 turns activity did not occur: Refused(due to pain)  Assist level: Minimal Assistance - Patient > 75% Assistive device: Walker-rolling    Walk 150 feet activity   Assist Walk 150 feet activity did not occur: Refused(due to pain)  Assist level: Minimal Assistance - Patient > 75% Assistive device: Walker-rolling    Walk 10 feet on uneven surface  activity   Assist Walk 10 feet on uneven surfaces activity did not occur: Refused         Wheelchair     Assist Will patient use wheelchair at discharge?: No      Wheelchair assist level: Dependent - Patient 0%      Wheelchair 50 feet with 2 turns activity    Assist    Wheelchair 50 feet with 2 turns activity did not occur: N/A       Wheelchair 150 feet activity     Assist Wheelchair 150 feet activity did not occur: N/A        Medical Problem List and Plan: 1.Confusion/altered mental status with gait disorder and speech difficultysecondary to left MCA infarction due to left M2 high-grade stenosis status post revascularizationembolic pattern likely secondary to hypercoagulable state  Repeat MRI showing multiple embolic strokes, neurology note reviewed  -58m topamax for headache relief   -tramadol for severe headache  Family does not want palliative care 2. Antithrombotics: -DVT/anticoagulation:SCDs.+ DOAC -antiplatelet therapy: ELIQUIS 3. Pain Management:Tylenol as  needed  Epigastric pain likely due to gastric adeno carcinoma relieved with GI cocktail  Repeat CT abd/Pelvis ordered, stable  -see h/a rx above 4. Mood:Wellbutrin 75 mg twice a day, Celexa 40 mg daily. Provide emotional support -antipsychotic agents: None  States she took something at home for anxiety, already on Wellbutrin and Celexa  5. Neuropsych: This patientiscapable of making decisions on herown behalf. 6. Skin/Wound Care:Routine skin checks 7. Fluids/Electrolytes/Nutrition:Monitor I/Os  Poor p.o. intake, consider medication if necessary after patient has bowel movement 8. Poorly differentiated adenocarcinoma the stomach as well as history of multiple myeloma and remote breast cancer. Patient  to follow up outpatient with oncology services Dr. MEarlie Server9. COPD with remote tobacco use. Continue nebulizer treatments as directed 10. Chronic anemia/thrombocytopenia. Patient follow-up outpatient with hematology oncology. Transfuse as needed  Platelets 58 on 3/8  Labs ordered for tomorrow 11. Orthostatic hypotension. ProAmatine 10 mg daily  Continue to monitor, IVF started 12. CKD stage IV. Creatinine baseline 2.29-2.93.   Creatinine 2.49 on 3/8 13. Hypothyroidism. Synthroid 14. GERD. Protonix 15. Hyperlipidemia. Lipitor  16.  Constipation:  Meds increased on 3/8 17.  HypoK:  KCL 238m BID ordered on 3/8  Potassium 3.1 on 3/8  Labs ordered for tomorrow, including magnesium 18.  Acute on chronic anemia  Hemoglobin 8.1 on 3/8  Labs ordered for tomorrow   Will consider transfusion to improve multiple symptoms  LOS: 8 days A FACE TO FACE EVALUATION WAS PERFORMED  Ankit AnLorie Phenix/08/2018, 8:07 AM

## 2018-03-23 NOTE — Progress Notes (Signed)
Occupational Therapy Session Note  Patient Details  Name: Nancy Norris MRN: [id number] Date of Birth: 18-Oct-1940  Today's Date: 03/23/2018 OT Individual Time:  - 75 minutes missed due to fatigue      Skilled Therapeutic Interventions/Progress Updates:    Pt seen for scheduled OT, appearing frail and lethargic in bed. Pt reported no pain or dizziness, but did c/o nausea. She declined to participate in gentle tx EOB or engage in self care tasks. Pt reported she felt "just too tired." RN made aware of nausea c/o and therapy refusal. 75 minutes missed.   Therapy Documentation Precautions:  Precautions Precautions: Fall Precaution Comments: Portacath + fistula Lt forearm  Restrictions Weight Bearing Restrictions: No Vital Signs: Therapy Vitals Temp: 97.7 F (36.5 C) Temp Source: Oral Pulse Rate: 79 Resp: 18 BP: 103/60 Patient Position (if appropriate): Lying Oxygen Therapy SpO2: 99 % O2 Device: Room Air Pain: Pain Assessment Pain Score: Asleep ADL: ADL Grooming: Setup Where Assessed-Grooming: Edge of bed Upper Body Bathing: Supervision/safety Where Assessed-Upper Body Bathing: Edge of bed Lower Body Bathing: Minimal assistance Where Assessed-Lower Body Bathing: Edge of bed Upper Body Dressing: Supervision/safety Where Assessed-Upper Body Dressing: Edge of bed Lower Body Dressing: Minimal assistance Where Assessed-Lower Body Dressing: Edge of bed Toileting: Not assessed Toilet Transfer: Minimal assistance Toilet Transfer Method: Arts development officer: Radiographer, therapeutic: (due to portacath and no shower orders from MD)    Therapy/Group: Individual Therapy  [name] [name] 03/23/2018, 7:37 AM

## 2018-03-23 NOTE — Progress Notes (Signed)
Physical Therapy Session Note  Patient Details  Name: Nancy Norris MRN: [id number] Date of Birth: 19-Apr-1940  Attempted to see pt for make up session, pt politely declined OOB therapy. She stated she was not in pain or nauseous this afternoon, but fearful of any movement increasing pain and/or nausea. Offered to assist up to recliner, pt continued to decline.    Nikcole Eischeid Clent Demark 03/23/2018, 3:36 PM

## 2018-03-23 NOTE — Progress Notes (Signed)
Poor appetite. PRN IV zofran  given at 2014. Rested without complaint of, until 0515. PRN IV zofran and ultram 25mg 's given at 0519, with relief. Nancy Norris A

## 2018-03-24 ENCOUNTER — Telehealth: Payer: Self-pay | Admitting: Medical Oncology

## 2018-03-24 ENCOUNTER — Inpatient Hospital Stay (HOSPITAL_COMMUNITY): Payer: Medicare Other | Admitting: Occupational Therapy

## 2018-03-24 ENCOUNTER — Inpatient Hospital Stay (HOSPITAL_COMMUNITY): Payer: Medicare Other

## 2018-03-24 ENCOUNTER — Inpatient Hospital Stay (HOSPITAL_COMMUNITY): Payer: Medicare Other | Admitting: Physical Therapy

## 2018-03-24 ENCOUNTER — Encounter: Payer: Self-pay | Admitting: Adult Health

## 2018-03-24 DIAGNOSIS — Z515 Encounter for palliative care: Secondary | ICD-10-CM

## 2018-03-24 DIAGNOSIS — Z7189 Other specified counseling: Secondary | ICD-10-CM

## 2018-03-24 LAB — CBC WITH DIFFERENTIAL/PLATELET
Abs Immature Granulocytes: 0.03 10*3/uL (ref 0.00–0.07)
Basophils Absolute: 0 10*3/uL (ref 0.0–0.1)
Basophils Relative: 0 %
Eosinophils Absolute: 0.2 10*3/uL (ref 0.0–0.5)
Eosinophils Relative: 4 %
HCT: 24.2 % — ABNORMAL LOW (ref 36.0–46.0)
Hemoglobin: 7.6 g/dL — ABNORMAL LOW (ref 12.0–15.0)
IMMATURE GRANULOCYTES: 1 %
Lymphocytes Relative: 21 %
Lymphs Abs: 1.1 10*3/uL (ref 0.7–4.0)
MCH: 33 pg (ref 26.0–34.0)
MCHC: 31.4 g/dL (ref 30.0–36.0)
MCV: 105.2 fL — ABNORMAL HIGH (ref 80.0–100.0)
MONO ABS: 0.7 10*3/uL (ref 0.1–1.0)
Monocytes Relative: 13 %
Neutro Abs: 3.4 10*3/uL (ref 1.7–7.7)
Neutrophils Relative %: 61 %
PLATELETS: 55 10*3/uL — AB (ref 150–400)
RBC: 2.3 MIL/uL — ABNORMAL LOW (ref 3.87–5.11)
RDW: 22.5 % — ABNORMAL HIGH (ref 11.5–15.5)
WBC: 5.5 10*3/uL (ref 4.0–10.5)
nRBC: 0 % (ref 0.0–0.2)

## 2018-03-24 LAB — BASIC METABOLIC PANEL
ANION GAP: 8 (ref 5–15)
BUN: 24 mg/dL — ABNORMAL HIGH (ref 8–23)
CO2: 18 mmol/L — ABNORMAL LOW (ref 22–32)
Calcium: 8.2 mg/dL — ABNORMAL LOW (ref 8.9–10.3)
Chloride: 112 mmol/L — ABNORMAL HIGH (ref 98–111)
Creatinine, Ser: 2.36 mg/dL — ABNORMAL HIGH (ref 0.44–1.00)
GFR calc Af Amer: 22 mL/min — ABNORMAL LOW (ref >60)
GFR calc non Af Amer: 19 mL/min — ABNORMAL LOW (ref >60)
Glucose, Bld: 102 mg/dL — ABNORMAL HIGH (ref 70–99)
POTASSIUM: 3.7 mmol/L (ref 3.5–5.1)
Sodium: 138 mmol/L (ref 135–145)

## 2018-03-24 LAB — MAGNESIUM: Magnesium: 2.1 mg/dL (ref 1.7–2.4)

## 2018-03-24 LAB — PREPARE RBC (CROSSMATCH)

## 2018-03-24 MED ORDER — DIPHENHYDRAMINE HCL 25 MG PO CAPS
25.0000 mg | ORAL_CAPSULE | Freq: Once | ORAL | Status: AC
  Administered 2018-03-24: 25 mg via ORAL
  Filled 2018-03-24: qty 1

## 2018-03-24 MED ORDER — FUROSEMIDE 10 MG/ML IJ SOLN
20.0000 mg | Freq: Once | INTRAMUSCULAR | Status: AC
  Administered 2018-03-24: 20 mg via INTRAVENOUS
  Filled 2018-03-24: qty 2

## 2018-03-24 MED ORDER — SODIUM CHLORIDE 0.9% IV SOLUTION
Freq: Once | INTRAVENOUS | Status: AC
  Administered 2018-03-24: 14:00:00 via INTRAVENOUS

## 2018-03-24 MED ORDER — ACETAMINOPHEN 325 MG PO TABS
650.0000 mg | ORAL_TABLET | Freq: Once | ORAL | Status: AC
  Administered 2018-03-24: 650 mg via ORAL
  Filled 2018-03-24: qty 2

## 2018-03-24 MED ORDER — ENSURE ENLIVE PO LIQD
237.0000 mL | Freq: Two times a day (BID) | ORAL | Status: DC
  Administered 2018-03-24 – 2018-03-26 (×2): 237 mL via ORAL

## 2018-03-24 NOTE — Telephone Encounter (Signed)
LVM for [name] to return my call regarding pt f/u.

## 2018-03-24 NOTE — Progress Notes (Addendum)
[place] PHYSICAL MEDICINE & REHABILITATION PROGRESS NOTE   Subjective/Complaints: Pt is comfortable in bed. Had incontinent, loose, black stool last night per RN. Headache only mild now.   ROS: Patient denies fever, rash, sore throat, blurred vision, nausea, vomiting,   cough, shortness of breath or chest pain, joint or back pain,   or mood change.   Objective:   No results found. Recent Labs    03/23/18 0334 03/24/18 0417  WBC 7.8 5.5  HGB 8.1* 7.6*  HCT 25.1* 24.2*  PLT 58* 55*   Recent Labs    03/23/18 0334 03/24/18 0417  NA 135 138  K 3.1* 3.7  CL 105 112*  CO2 20* 18*  GLUCOSE 99 102*  BUN 28* 24*  CREATININE 2.49* 2.36*  CALCIUM 8.1* 8.2*    Intake/Output Summary (Last 24 hours) at 03/24/2018 0847 Last data filed at 03/24/2018 0600 Gross per 24 hour  Intake 2017.03 ml  Output -  Net 2017.03 ml     Physical Exam: Vital Signs Blood pressure 126/79, pulse 75, temperature 98 F (36.7 C), temperature source Oral, resp. rate 18, height '5\' 5"'$  (1.651 m), weight 75 kg, SpO2 100 %.   Constitutional: No distress . Vital signs reviewed. HEENT: EOMI, oral membranes moist Neck: supple Cardiovascular: RRR without murmur. No JVD    Respiratory: CTA Bilaterally without wheezes or rales. Normal effort    GI: BS +, non-tender, non-distended  Musc: No edema or tenderness in extremities. Skin: No evidence of breakdown, no evidence of rash Neurologic: Alert Motor:4 to 4+/5 RUE/RLE  5-5 LUE/LLE.  Psych: pleasant, flat  Assessment/Plan: 1. Functional deficits secondary to Left MCA infarct which require 3+ hours per day of interdisciplinary therapy in a comprehensive inpatient rehab setting.  Physiatrist is providing close team supervision and 24 hour management of active medical problems listed below.  Physiatrist and rehab team continue to assess barriers to discharge/monitor patient progress toward functional and medical goals  Care Tool:  Bathing    Body parts  bathed by patient: Right arm, Left arm, Chest, Abdomen, Front perineal area, Buttocks, Right upper leg, Left upper leg, Face, Right lower leg, Left lower leg         Bathing assist Assist Level: Minimal Assistance - Patient > 75%(for standing balance)     Upper Body Dressing/Undressing Upper body dressing   What is the patient wearing?: Pull over shirt    Upper body assist Assist Level: Minimal Assistance - Patient > 75%    Lower Body Dressing/Undressing Lower body dressing      What is the patient wearing?: Underwear/pull up, Pants     Lower body assist Assist for lower body dressing: Minimal Assistance - Patient > 75%     Toileting Toileting Toileting Activity did not occur (Clothing management and hygiene only): N/A (no void or bm)  Toileting assist Assist for toileting: Minimal Assistance - Patient > 75% Assistive Device Comment: (Front wheel walker)   Transfers Chair/bed transfer  Transfers assist     Chair/bed transfer assist level: 2 Helpers     Locomotion Ambulation   Ambulation assist      Assist level: Minimal Assistance - Patient > 75% Assistive device: Walker-rolling Max distance: 180   Walk 10 feet activity   Assist  Walk 10 feet activity did not occur: Refused(due to pain)  Assist level: Minimal Assistance - Patient > 75% Assistive device: Walker-rolling   Walk 50 feet activity   Assist Walk 50 feet with 2 turns activity  did not occur: Refused(due to pain)  Assist level: Minimal Assistance - Patient > 75% Assistive device: Walker-rolling    Walk 150 feet activity   Assist Walk 150 feet activity did not occur: Refused(due to pain)  Assist level: Minimal Assistance - Patient > 75% Assistive device: Walker-rolling    Walk 10 feet on uneven surface  activity   Assist Walk 10 feet on uneven surfaces activity did not occur: Refused         Wheelchair     Assist Will patient use wheelchair at discharge?: No       Wheelchair assist level: Dependent - Patient 0%      Wheelchair 50 feet with 2 turns activity    Assist    Wheelchair 50 feet with 2 turns activity did not occur: N/A       Wheelchair 150 feet activity     Assist Wheelchair 150 feet activity did not occur: N/A        Medical Problem List and Plan: 1.Confusion/altered mental status with gait disorder and speech difficultysecondary to left MCA infarction due to left M2 high-grade stenosis status post revascularizationembolic pattern likely secondary to hypercoagulable state  Repeat MRI showing multiple embolic strokes, neurology note reviewed  Spoke to pt's daughter this morning. She understands the dilemma her mother is in between GI bleeding and hypercoagulable embolic infarcts    -daughter/pt agree to palliative care consult   -will contact Dr. Julien Nordmann for input. Daughter asked about options for treating her gastric cancer and that Julien Nordmann had mentioned radiation  -continue activities on rehab as possible  2. Antithrombotics: -DVT/anticoagulation:SCDs.+ DOAC -antiplatelet therapy: ELIQUIS 3. Pain Management:Tylenol as needed  Epigastric pain likely due to gastric adeno carcinoma relieved with GI cocktail  -headache improved with topamax  -continue tramadol for more severe pain 4. Mood:Wellbutrin 75 mg twice a day, Celexa 40 mg daily. Provide emotional support -antipsychotic agents: None  States she took something at home for anxiety, already on Wellbutrin and Celexa  5. Neuropsych: This patientiscapable of making decisions on herown behalf. 6. Skin/Wound Care:Routine skin checks 7. Fluids/Electrolytes/Nutrition:Monitor I/Os  Encourage PO intake as possible  8. Poorly differentiated adenocarcinoma the stomach as well as history of multiple myeloma and remote breast cancer. Patient  to follow up outpatient with oncology services Dr. Earlie Server 9. COPD with remote tobacco use.  Continue nebulizer treatments as directed 10. Chronic anemia/thrombocytopenia. Patient follow-up outpatient with hematology oncology. Transfuse as needed  Platelets 58 on 3/8---> 55 3/9  Hgb 7.6 today 3/9 with black stools  -transfuse 2u prbc   -serial labs  -oncology input would be appreciated 11. Orthostatic hypotension. ProAmatine 10 mg daily  Continue to monitor, continue IVF for now 12. CKD stage IV. Creatinine baseline 2.29-2.93.   Creatinine 2.49 on 3/8---->2.39 3/9 13. Hypothyroidism. Synthroid 14. GERD. Protonix 15. Hyperlipidemia. Lipitor  16.  Constipation:  Meds increased on 3/8 17.  HypoK:  KCL 39mq BID ordered on 3/8  Potassium 3.1 on 3/8, back to 3.7 3/9   LOS: 9 days A FACE TO FRonneby3/09/2018, 8:47 AM   Thirty -five minutes of face to face patient care time were spent during this visit. All questions were encouraged and answered.

## 2018-03-24 NOTE — Progress Notes (Signed)
Occupational Therapy Note  Patient Details  Name: Nancy Norris MRN: [id number] Date of Birth: 1940-10-14  Today's Date: 03/24/2018 OT Missed Time: 11 Minutes Missed Time Reason: Pain;Nursing care  Pt missed 60 mins scheduled OT treatment session due to c/o pain in stomach and headache.  Spoke with PA and RN regarding orders for blood transfusion and plan of care.  RN to begin transfusion.  Awaiting palliative consult.   [name] 03/24/2018, 9:48 AM

## 2018-03-24 NOTE — Progress Notes (Signed)
Initial Nutrition Assessment  DOCUMENTATION CODES:   Non-severe (moderate) malnutrition in context of acute illness/injury  INTERVENTION:   - Add Ensure Enlive BID (each provides 350 kcal, 20 g protein)  NUTRITION DIAGNOSIS:   Moderate Malnutrition related to acute illness as evidenced by energy intake < or equal to 50% for > or equal to 5 days, mild fat depletion, mild muscle depletion, percent weight loss (6%).  GOAL:   Patient will meet greater than or equal to 90% of their needs  MONITOR:   PO intake, Supplement acceptance, Labs, Weight trends  REASON FOR ASSESSMENT:   Malnutrition Screening Tool    ASSESSMENT:   78 yo female, admitted with L MCA infarction d/t L M2 high-grade stenosis. Presents with AMS, gait disorder, speech difficulties, GI bleed. Complex PMH significant for anemia, vitamin B-12 deficiency, h/o breast cancer, COPD, GERD, hyperkalemia, hyponatremia, HTN, hypothyroidism, mucositis, h/o MI.  Labs: chloride elevated, glucose 102 mg/dL, BUN and Creatinine elevated, corrected Ca 8.84 low Meds: Lipitor, Folvite, Lasix, levothyroxine, Protonix EC, potassium chloride, Senokot-S, vitamin B-12  Pt sitting up in bed at time of visit. Pt endorses poor appetite, unsure for how long. Denies recent wt loss. No nausea, vomiting, diarrhea, constipation, or difficulty chewing or swallowing reported. Encouraged pt to include protein-rich foods with all meals and snacks, and to eat those foods first if not feeling hungry. Pt amenable to receiving Ensure Enlive in strawberry BID.  Palliative consult in place, will monitor GOC.  NUTRITION - FOCUSED PHYSICAL EXAM:   Most Recent Value  Orbital Region  Mild depletion  Upper Arm Region  No depletion  Thoracic and Lumbar Region  No depletion  Buccal Region  No depletion  Temple Region  No depletion  Clavicle Bone Region  No depletion  Clavicle and Acromion Bone Region  No depletion  Scapular Bone Region  No depletion   Dorsal Hand  Mild depletion  Patellar Region  No depletion  Anterior Thigh Region  Mild depletion  Posterior Calf Region  Mild depletion  Edema (RD Assessment)  None  Hair  Reviewed  Eyes  Reviewed  Mouth  Reviewed  Skin  Reviewed  Nails  Reviewed     Diet Order:  Food allergies: chocolate, onions Diet Order            Diet regular Room service appropriate? Yes; Fluid consistency: Thin  Diet effective now            PO Intake 3/6-3/9: average 12% x 6 meals recorded  EDUCATION NEEDS:  Education needs have been addressed  Skin:  Skin Assessment: Skin Integrity Issues: Skin Integrity Issues:: Other (Comment) Other: Ecchymosis - abdomen, BL arm, leg, chest; Fissure - medial sacrum; MASD - medial coccyx, sacrum  Last BM:  3/9, type 6  Height:  Ht Readings from Last 1 Encounters:  03/15/18 5\' 5"  (1.651 m)    Weight:  Wt Readings from Last 10 Encounters:  03/22/18 75 kg  03/12/18 80.1 kg  03/11/18 80.4 kg  02/26/18 79.5 kg  02/19/18 80.6 kg  02/06/18 80.5 kg  01/23/18 81.9 kg  01/20/18 78.9 kg  01/10/18 81.1 kg  12/25/17 79.9 kg  4.9 kg wt loss x 3 months = 6% may be indicative of malnutrition  Ideal Body Weight:  56.8 kg  BMI:  Body mass index is 27.51 kg/m., overweight, considered protective for age group  Estimated Nutritional Needs: calculated based on 3/7 wt (75 kg)  Kcal:  1875-2250 (25-30 kcal/kg)  Protein:  90-113 g (  1.2-1.5 g/kg)  Fluid:  1 mL/kcal or per MD  Althea Grimmer, MS, RDN, LDN Pager: [phone] Available Mondays and Fridays, 9am-2pm

## 2018-03-24 NOTE — Progress Notes (Signed)
Occupational Therapy Weekly Progress Note  Patient Details  Name: Nancy Norris MRN: [id number] Date of Birth: [birth year]  Beginning of progress report period: March 16, 2018 End of progress report period: March 24, 2018  Short term goals not set due to estimated length of stay.  Pt has made fair progress towards LTGs as she has demonstrated ability to complete transfers with min assist.  However pt has been very limited by medical issues, which has impacted her participation in therapy sessions.  Pt with frequent c/o headaches and stomach pain.  Pt with new c/o visual impairments and requires mod-max assist squat pivot transfers due to weakness and visual impairments.  MRI showing multiple embolic strokes.  Daughter and pt agreeable to palliative care consult for goals of care.    Patient continues to demonstrate the following deficits: muscle weakness, decreased cardiorespiratoy endurance, impaired timing and sequencing, unbalanced muscle activation, motor apraxia, ataxia, decreased coordination and decreased motor planning, decreased visual acuity and decreased visual motor skills, decreased awareness, decreased problem solving, decreased safety awareness and decreased memory and decreased sitting balance, decreased standing balance, decreased postural control, hemiplegia and decreased balance strategies and therefore will continue to benefit from skilled OT intervention to enhance overall performance with BADL and Reduce care partner burden.  See Patient's Care Plan for progression toward long term goals.  Patient not progressing toward long term goals. Plan of Care modified to QD.    [name] 03/24/2018, 9:39 AM

## 2018-03-24 NOTE — Progress Notes (Signed)
Physical Therapy Note  Patient Details  Name: Nancy Norris MRN: [id number] Date of Birth: 03-15-1940 Today's Date: 03/24/2018    RN defers therapy session. Pt awaiting palliative consult on this date, as well as receiving blood transfusions. [name] schedule has been reduced to QD. Will f/u per POC.   [name] 03/24/2018, 1:18 PM

## 2018-03-24 NOTE — Plan of Care (Signed)
  Problem: RH SKIN INTEGRITY Goal: RH STG SKIN FREE OF INFECTION/BREAKDOWN Description With min assist  Outcome: Progressing Goal: RH STG MAINTAIN SKIN INTEGRITY WITH ASSISTANCE Description STG Maintain Skin Integrity With min Assistance.  Outcome: Progressing Flowsheets (Taken 03/24/2018 1456) STG: Maintain skin integrity with assistance: 4-Minimal assistance   Problem: RH SAFETY Goal: RH STG ADHERE TO SAFETY PRECAUTIONS W/ASSISTANCE/DEVICE Description STG Adhere to Safety Precautions With reminders Assistance/Device. Min assist  Outcome: Progressing Flowsheets (Taken 03/24/2018 1456) STG:Pt will adhere to safety precautions with assistance/device: 4-Minimal assistance

## 2018-03-24 NOTE — Progress Notes (Signed)
Large BM on previous shift after taking miralax. At [facility] patient declined scheduled miralax, but agreed to take senna S. 1 small and 1 medium,  Incontinent, loose,  black stool last night. Zofran given at 2053 and 0515. PRN ultram given at 0515 for c/o HA. Dry heaves this AM, no emesis. Drinking water only, refusing solid food or ensure supplement. Patrici Ranks A

## 2018-03-24 NOTE — Telephone Encounter (Signed)
I will try to see her once I feel better.  I hate to see her before completely recover.

## 2018-03-24 NOTE — Telephone Encounter (Signed)
Plan of care- She is asking about tx for stomach cancer. She is meeting with palliative service today re disposition. She thinks pt would benefit emotionally  from having [place] come by and see her.

## 2018-03-24 NOTE — Progress Notes (Signed)
Occupational Therapy Note  Patient Details  Name: Nancy Norris MRN: [id number] Date of Birth: 08-11-1940  [name] plan of care adjusted to QD after speaking with care team and discussed with PA as pt currently unable to tolerate current therapy schedule with OT, PT, and SLP due to multiple medical issues.   [name] 03/24/2018, 9:37 AM

## 2018-03-24 NOTE — Progress Notes (Signed)
Patient refused to take some of her medicine today.

## 2018-03-24 NOTE — Telephone Encounter (Signed)
Palliative care consulted to arrange "burden of care". Nancy Norris -dtr asking to talk to [facility].

## 2018-03-24 NOTE — Consult Note (Signed)
Consultation Note Date: 03/24/2018   Patient Name: Nancy Norris  DOB: May 02, 1940  MRN: [id number]  Age / Sex: 78 y.o., female  PCP: [name], MD Referring Physician: Charlett Blake, MD  Reason for Consultation: Establishing goals of care  HPI/Patient Profile: 78 y.o. female  with past medical history of multiple myeloma not in remission was under treatment with kyprolis and cytoxan prior to this admission, remote history of breast cancer, COPD,  Fibromyalgia, CKD IV,  admitted on 03/15/2018 with CVA. She was initially admitted to [facility] on 2/17 with CVA and discharged to CIR, then experienced repeat CVA. Further workup has revealed fungating bleeding gastric mass. Course complicated due to malignancy increasing coagulopathy, anticoagulation increases bleeding which is complicated by multiple myeloma for which treatment is currently on hold- patient requiring blood transfusions to maintain Hgb. Patient with poor functional status for treatment of malignancy, and high risk for recurrently stroke without anticoagulation. Palliative medicine consulted for [place].   Clinical Assessment and Goals of Care:  I have reviewed medical records including EPIC notes, labs and imaging, received, assessed the patient and then met at the bedside along with patient and her daughter, Nancy Norris, to discuss diagnosis prognosis, [place], EOL wishes, disposition and options.  Evaluated patient at bedside. She was able to participate minimally in [place] discussion. She stated she is "tired". She mostly listened during conversation with daughter Nancy Norris, but appeared to understand all discussion and asked appropriate questions.   I introduced Palliative Medicine as specialized medical care for people living with serious illness. It focuses on providing relief from the symptoms and stress of a serious illness. The goal is to improve quality of life  for both the patient and the family.  We discussed a brief life review of the patient. She has lived in [place] and [place]. Retired from working with a Engineer, maintenance (IT). She has a cat named Bo.   As far as functional and nutritional status- prior to admission she was living independently. Her status has waxed and waned this admission, with noted worsening after most recent embolic CVA. Her daughter notes she has not eaten in 3 days. She is not ambulatory. She is sleeping more during the day, seems very distracted. Per Nancy Norris she "doesn't seem like herself".    We discussed her current illness and what it means in the larger context of her on-going co-morbidities.  Natural disease trajectory and expectations at EOL were discussed. Patient and Nancy Norris verbalized their understanding of the difficult place her illness has reached. Patient is unable to verbalize her EOL wishes. Nancy Norris states that patient has previously stated she wouldn't want to unneccessarily prolong a state of suffering.   The difference between aggressive medical intervention and comfort care was discussed and in light of the patient's goals of care.   Advanced directives, concepts specific to code status, artifical feeding and hydration, and rehospitalization were considered and discussed. Patient is currently DNR.  Hospice and Palliative Care services outpatient were explained and offered.  Questions and concerns were addressed. The  family was encouraged to call with questions or concerns.    Primary Decision Maker HCPOA- patient's daughter- Nancy Norris    SUMMARY OF RECOMMENDATIONS -DNR -Continue current course of care for today -Patient and Nancy Norris are processing all information received during today's conversation, no decisions were made regarding goals of care -Their main concern is that patient will not be able to return home -If patient transitioned to full comfort care, I discussed she would like be eligible for residential Hospice given  her poor po intake and declining hemoglobin, active bleeding. We also discussed the option of going home with Hospice and supplementing with private in home care -Plan made to meet again tomorrow afternoon with patient's daughter, Nancy Norris  Code Status/Advance Care Planning:  DNR  Palliative Prophylaxis:   Delirium Protocol and Frequent Pain Assessment  Additional Recommendations (Limitations, Scope, Preferences):  Full Scope Treatment  Psycho-social/Spiritual:   Desire for further Chaplaincy support:yes  Additional Recommendations: Education on Hospice  Prognosis:    Unable to determine  Discharge Planning: To Be Determined  Primary Diagnoses: Present on Admission: . Left middle cerebral artery stroke ([place])   I have reviewed the medical record, interviewed the patient and family, and examined the patient. The following aspects are pertinent.  Past Medical History:  Diagnosis Date  . Anemia   . Anginal pain ([place])    used NTG x 2 May 31 and 06/15/13   . Anxiety   . Arthritis   . B12 deficiency 12/04/2014  . Breast cancer ([place])   . Complication of anesthesia   . COPD (chronic obstructive pulmonary disease) ([place])   . Cryptococcal pneumonitis ([place]) 11/22/2015  . Depression   . Dizziness   . Dyspnea   . Fibromyalgia   . Fibromyalgia   . GERD (gastroesophageal reflux disease)   . Headache(784.0)   . Heart murmur   . Hemoptysis 10/21/2015  . History of blood transfusion    last one May 12   . Hx of cardiovascular stress test    LexiScan with low level exercise Myoview (02/2013): No ischemia, EF 72%; normal study  . Hx of echocardiogram    a.  Echocardiogram (12/26/2012): EF 62-83%, grade 1 diastolic dysfunction;   b.  Echocardiogram (02/2013): EF 55-60%, no WMA, trivial effusion  . Hyperkalemia   . Hyponatremia   . Hypotension   . Hypothyroidism   . Mucositis   . Multiple myeloma   . Myocardial infarction [facility])    in past, patient was unaware.   . Neuropathy   .  Nodule of left lung 09/13/2015  . Pain in joint, pelvic region and thigh 07/07/2015  . Pneumonia    several  . PONV (postoperative nausea and vomiting) 2008   after mastestomy   Social History   Socioeconomic History  . Marital status: Single    Spouse name: Not on file  . Number of children: 1  . Years of education: Not on file  . Highest education level: Not on file  Occupational History  . Occupation: retired-sect.Licensed conveyancer)  Social Needs  . Financial resource strain: Not on file  . Food insecurity:    Worry: Not on file    Inability: Not on file  . Transportation needs:    Medical: Yes    Non-medical: No  Tobacco Use  . Smoking status: Former Smoker    Packs/day: 1.00    Years: 30.00    Pack years: 30.00    Types: Cigarettes    Last attempt to quit: 02/15/2005  Years since quitting: 13.1  . Smokeless tobacco: Never Used  Substance and Sexual Activity  . Alcohol use: No  . Drug use: No  . Sexual activity: Never  Lifestyle  . Physical activity:    Days per week: Not on file    Minutes per session: Not on file  . Stress: Not on file  Relationships  . Social connections:    Talks on phone: Not on file    Gets together: Not on file    Attends religious service: Not on file    Active member of club or organization: Not on file    Attends meetings of clubs or organizations: Not on file    Relationship status: Not on file  Other Topics Concern  . Not on file  Social History Narrative  . Not on file   Family History  Problem Relation Age of Onset  . Arthritis Mother   . Asthma Mother   . Cancer Sister   . Hyperlipidemia Brother    Scheduled Meds: . apixaban  5 mg Oral BID  . atorvastatin  20 mg Oral q1800  . buPROPion  75 mg Oral BID  . citalopram  40 mg Oral Daily  . feeding supplement (ENSURE ENLIVE)  237 mL Oral BID BM  . folic acid  1 mg Oral Daily  . furosemide  20 mg Intravenous Once  . levothyroxine  150 mcg Oral QAC breakfast  . midodrine   10 mg Oral Daily  . pantoprazole  40 mg Oral Daily  . polyethylene glycol  17 g Oral BID  . potassium chloride  20 mEq Oral BID  . senna-docusate  2 tablet Oral QHS  . topiramate  25 mg Oral QHS  . vitamin B-12  1,000 mcg Oral Daily   Continuous Infusions: . sodium chloride 75 mL/hr at 03/24/18 1444   PRN Meds:.acetaminophen, albuterol, alum & mag hydroxide-simeth, bisacodyl, cromolyn, ipratropium-albuterol, ondansetron (ZOFRAN) IV, sodium chloride flush, traMADol Medications Prior to Admission:  Prior to Admission medications   Medication Sig Start Date End Date Taking? Authorizing Provider  acetaminophen (TYLENOL) 500 MG tablet Take 2 tablets (1,000 mg total) by mouth daily as needed for moderate pain or headache. 03/11/18   Florencia Reasons, MD  albuterol (PROVENTIL HFA;VENTOLIN HFA) 108 (90 Base) MCG/ACT inhaler Inhale 1-2 puffs into the lungs every 6 (six) hours as needed for wheezing or shortness of breath (cough). 01/10/18   Garnet Sierras, NP  aspirin EC 325 MG tablet Take 1 tablet (325 mg total) by mouth daily. 03/11/18   Florencia Reasons, MD  atorvastatin (LIPITOR) 20 MG tablet Take 1 tablet (20 mg total) by mouth daily at 6 PM. 03/11/18   Florencia Reasons, MD  azelastine (ASTELIN) 0.1 % nasal spray Place 1 spray into both nostrils daily. Use in each nostril as directed 01/10/18   Garnet Sierras, NP  buPROPion (WELLBUTRIN) 75 MG tablet Take 1 tablet (75 mg total) by mouth 2 (two) times daily. 05/29/17 05/29/18  Liane Comber, NP  citalopram (CELEXA) 40 MG tablet Take 1 tablet daily for Mood 10/18/17   [name], MD  cromolyn (OPTICROM) 4 % ophthalmic solution INSTILL 1 TO 2 DROPS IN AFFECTED EYE(S) 4 TIMES A DAY FOR ALLERGIES. Patient taking differently: Place 1-2 drops into both eyes 4 (four) times daily as needed (allergies).  12/27/17   Liane Comber, NP  feeding supplement, ENSURE ENLIVE, (ENSURE ENLIVE) LIQD Take 237 mLs by mouth 2 (two) times daily between meals. 03/11/18   Florencia Reasons,  MD    fluconazole (DIFLUCAN) 150 MG tablet Take one tablet at onset of symptoms and second tablet three days later for yeast. Patient not taking: Reported on 03/03/2018 01/10/18   Garnet Sierras, NP  gabapentin (NEURONTIN) 300 MG capsule TAKE 1 CAPSULE BY MOUTH 3 TIMES DAILY. Patient taking differently: Take 300-600 mg by mouth See admin instructions. 600 mg at bedtime and an additional 300 mg during the day if "cramping" 02/17/18   [name], MD  ipratropium-albuterol (DUONEB) 0.5-2.5 (3) MG/3ML SOLN Take 3 mLs by nebulization every 4 (four) hours as needed. Max:6 doses per day 01/10/18   Garnet Sierras, NP  levocetirizine (XYZAL) 5 MG tablet Take 1 tablet (5 mg total) by mouth every evening. 01/10/18 01/11/19  Garnet Sierras, NP  levothyroxine (SYNTHROID, LEVOTHROID) 150 MCG tablet TAKE 1 TABLET BY MOUTH DAILY. NEED OFFICE VISIT FOR FURTHER REFILLS. Patient taking differently: Take by mouth daily before breakfast. TAKE 1 TABLET BY MOUTH DAILY. NEED OFFICE VISIT FOR FURTHER REFILLS. 10/03/17   [name], MD  loperamide (IMODIUM) 2 MG capsule Take 1 capsule (2 mg total) by mouth as needed for diarrhea or loose stools. 02/28/16   [name], MD  magic mouthwash SOLN Take 5 mLs by mouth 4 (four) times daily as needed for mouth pain. 10/27/15   Curt Bears, MD  midodrine (PROAMATINE) 10 MG tablet Take 1 tablet daily to support BP Patient taking differently: Take 10 mg by mouth daily.  12/03/17   [name], MD  montelukast (SINGULAIR) 10 MG tablet Take 1 tablet daily for Allergies 10/18/17   [name], MD  nitroGLYCERIN (NITROSTAT) 0.4 MG SL tablet Place 0.4 mg under the tongue every 5 (five) minutes as needed for chest pain.     [provider]  nystatin (MYCOSTATIN/NYSTOP) powder Apply topically 2 (two) times daily. 01/10/18   McClanahan, Danton Sewer, NP  ondansetron (ZOFRAN ODT) 8 MG disintegrating tablet Take 1 tablet (8 mg total) by mouth every 8 (eight) hours as  needed for nausea or vomiting. 12/17/17   Curt Bears, MD  pantoprazole (PROTONIX) 40 MG tablet Take 1 tablet (40 mg total) by mouth daily. 03/11/18   Florencia Reasons, MD  pilocarpine (SALAGEN) 5 MG tablet TAKE 1 TABLET BY MOUTH 3 TIMES DAILY FOR DRY MOUTH. Patient taking differently: Take 5 mg by mouth 2 (two) times daily.  09/26/16   [name], MD  ranitidine (ZANTAC) 300 MG tablet Take 1 tablet 2 x /day at [facility] & Bedtime for Heart burn & Reflux Patient taking differently: Take 300 mg by mouth 2 (two) times daily.  09/16/17   [name], MD  sucralfate (CARAFATE) 1 g tablet Take 1 tablet dissolved in water  4 x /day BEFORE Meals & Bedtime for Acid indigestion & Reflux Patient taking differently: Take 1 g by mouth 4 (four) times daily -  with meals and at bedtime. Take 1 tablet dissolved in water  4 x /day BEFORE Meals & Bedtime for Acid indigestion & Reflux 10/04/17   [name], MD  traMADol (ULTRAM) 50 MG tablet Take 1 to 2 tablets by mouth 4 times a day as needed for pain. Patient taking differently: Take 50-100 mg by mouth 4 (four) times daily as needed for moderate pain. Take 1 to 2 tablets by mouth 4 times a day as needed for pain. 07/22/15   [name], MD  umeclidinium-vilanterol (ANORO ELLIPTA) 62.5-25 MCG/INH AEPB Inhale 1 puff into the lungs daily as needed (shortness of breath). Patient not taking:  Reported on 03/03/2018 01/10/18   Garnet Sierras, NP  vitamin B-12 (CYANOCOBALAMIN) 1000 MCG tablet Take 1 tablet (1,000 mcg total) by mouth daily. Patient taking differently: Take 2,000 mcg by mouth daily.  03/19/17   Kinnie Feil, MD   Allergies  Allergen Reactions  . Codeine Anaphylaxis    anaphylasix tolarates oxycodone per care cast  . Latex Shortness Of Breath  . Chocolate     migraines  . Onion Other (See Comments)    Causes migraine headaches  . Zyprexa [Olanzapine] Other (See Comments)    Confusion , dizzy,unsteady  . Adhesive [Tape] Other (See Comments)      blisters  . Heparin Rash    Can flush port with heparin  . Hydrocodone Rash    With extreme itching  . Iodinated Diagnostic Agents Hives, Itching, Rash and Other (See Comments)    Happened 60 years ago Stage IV kidney function  . Oxycodone Rash  . Promethazine-Dm Hives  . Sulfa Antibiotics Itching   Review of Systems  Unable to perform ROS: Mental status change    Physical Exam Vitals signs and nursing note reviewed.  Constitutional:      Comments: frail  Skin:    Coloration: Skin is pale.  Neurological:     Mental Status: She is oriented to person, place, and time.  Psychiatric:     Comments: Tearful at times     Vital Signs: BP 121/69 (BP Location: Right Arm)   Pulse 70   Temp 97.9 F (36.6 C) (Oral)   Resp 20   Ht '5\' 5"'$  (1.651 m)   Wt 75 kg   SpO2 100%   BMI 27.51 kg/m  Pain Scale: 0-10   Pain Score: 0-No pain   SpO2: SpO2: 100 % O2 Device:SpO2: 100 % O2 Flow Rate: .   IO: Intake/output summary:   Intake/Output Summary (Last 24 hours) at 03/24/2018 1628 Last data filed at 03/24/2018 1402 Gross per 24 hour  Intake 1917.03 ml  Output -  Net 1917.03 ml    LBM: Last BM Date: 03/24/18 Baseline Weight: Weight: 79.2 kg Most recent weight: Weight: 75 kg     Palliative Assessment/Data: PPS: 20%     Thank you for this consult. Palliative medicine will continue to follow and assist as needed.   Time In: 1500 Time Out: 1615 Time Total: 75 mins Greater than 50%  of this time was spent counseling and coordinating care related to the above assessment and plan.  Signed by: Mariana Kaufman, AGNP-C Palliative Medicine    Please contact Palliative Medicine Team phone at [phone] for questions and concerns.  For individual provider: See Shea Evans

## 2018-03-24 NOTE — Progress Notes (Signed)
Speech Language Pathology Daily Session Note  Patient Details  Name: Nancy Norris MRN: [id number] Date of Birth: 1940-11-24  Today's Date: 03/24/2018 SLP Individual Time: 0623-7628 SLP Individual Time Calculation (min): 28 min  Short Term Goals: Week 1: SLP Short Term Goal 1 (Week 1): Pt will complete functional basic problem solving tasks with Min A. SLP Short Term Goal 2 (Week 1): Pt will utilize word finding strategies to produce sentence length utterances with Mod A cues in 8 out of 10 opportunities.  SLP Short Term Goal 3 (Week 1): Pt will demonstrate selective attention for ~ 30 minutes in mildy distracting environment with Min A cues.   Skilled Therapeutic Interventions:Skilled ST services focused on cognitve skills. SLP facilitated recall and awareness of recent infracts, pt required total A and no awareness of events, however agreeable to changes. SLP provided education of medical events. SLP attempted to facilitate basic problem solving skills with basic cards task, pt required max A verbal cues to recall three rules, reduced focused/sustained attention (closing eyes intermittently) and delay processing of requested verbal/function tasks. Pt appeared lethargic and become upset stating " I just don't understand" , SLP provided emotional support.Pt was left in room with call bell within reach and bed alarm set. Recommend to continue skilled ST services. SLP communicated with social worker, Jacqlyn Larsen about awaiting palliative consultation and SLP will wait for plan prior to witting weekly note.     Pain Pain Assessment Pain Score: 0-No pain  Therapy/Group: Individual Therapy  Jakin Pavao  [facility] 03/24/2018, 10:37 AM

## 2018-03-25 ENCOUNTER — Inpatient Hospital Stay (HOSPITAL_COMMUNITY): Payer: Medicare Other | Admitting: Occupational Therapy

## 2018-03-25 ENCOUNTER — Inpatient Hospital Stay (HOSPITAL_COMMUNITY): Payer: Medicare Other | Admitting: Physical Therapy

## 2018-03-25 ENCOUNTER — Inpatient Hospital Stay (HOSPITAL_COMMUNITY): Payer: Medicare Other

## 2018-03-25 DIAGNOSIS — I639 Cerebral infarction, unspecified: Secondary | ICD-10-CM

## 2018-03-25 DIAGNOSIS — I82409 Acute embolism and thrombosis of unspecified deep veins of unspecified lower extremity: Secondary | ICD-10-CM

## 2018-03-25 DIAGNOSIS — C9000 Multiple myeloma not having achieved remission: Secondary | ICD-10-CM

## 2018-03-25 LAB — TYPE AND SCREEN
ABO/RH(D): O POS
Antibody Screen: NEGATIVE
Unit division: 0
Unit division: 0

## 2018-03-25 LAB — CBC
HCT: 33.3 % — ABNORMAL LOW (ref 36.0–46.0)
Hemoglobin: 10.9 g/dL — ABNORMAL LOW (ref 12.0–15.0)
MCH: 30.9 pg (ref 26.0–34.0)
MCHC: 32.7 g/dL (ref 30.0–36.0)
MCV: 94.3 fL (ref 80.0–100.0)
Platelets: 48 10*3/uL — ABNORMAL LOW (ref 150–400)
RBC: 3.53 MIL/uL — ABNORMAL LOW (ref 3.87–5.11)
RDW: 23.4 % — ABNORMAL HIGH (ref 11.5–15.5)
WBC: 5.8 10*3/uL (ref 4.0–10.5)
nRBC: 0 % (ref 0.0–0.2)

## 2018-03-25 LAB — BPAM RBC
Blood Product Expiration Date: 202004032359
Blood Product Expiration Date: 202004032359
ISSUE DATE / TIME: 202003091420
ISSUE DATE / TIME: 202003091731
Unit Type and Rh: 5100
Unit Type and Rh: 5100

## 2018-03-25 MED ORDER — LORAZEPAM 1 MG PO TABS: 1.0000 mg | ORAL_TABLET | ORAL | Status: DC | PRN

## 2018-03-25 MED ORDER — SODIUM CHLORIDE 0.9% FLUSH
3.0000 mL | Freq: Two times a day (BID) | INTRAVENOUS | Status: DC
  Administered 2018-03-26: 3 mL via INTRAVENOUS

## 2018-03-25 MED ORDER — HYDROMORPHONE HCL 1 MG/ML IJ SOLN: 0.5000 mg | INTRAMUSCULAR | Status: DC | PRN

## 2018-03-25 MED ORDER — HALOPERIDOL LACTATE 5 MG/ML IJ SOLN
0.5000 mg | INTRAMUSCULAR | Status: DC | PRN
  Filled 2018-03-25: qty 0.1

## 2018-03-25 MED ORDER — HALOPERIDOL 0.5 MG PO TABS
0.5000 mg | ORAL_TABLET | ORAL | Status: DC | PRN
  Filled 2018-03-25: qty 1

## 2018-03-25 MED ORDER — HALOPERIDOL LACTATE 2 MG/ML PO CONC
0.5000 mg | ORAL | Status: DC | PRN
  Filled 2018-03-25: qty 0.3

## 2018-03-25 MED ORDER — POLYVINYL ALCOHOL 1.4 % OP SOLN
1.0000 [drp] | Freq: Four times a day (QID) | OPHTHALMIC | Status: DC | PRN
  Filled 2018-03-25: qty 15

## 2018-03-25 MED ORDER — SODIUM CHLORIDE 0.9% FLUSH: 3.0000 mL | INTRAVENOUS | Status: DC | PRN

## 2018-03-25 MED ORDER — SODIUM CHLORIDE 0.9 % IV SOLN: 250.0000 mL | INTRAVENOUS | Status: DC | PRN

## 2018-03-25 MED ORDER — LORAZEPAM 2 MG/ML IJ SOLN
1.0000 mg | INTRAMUSCULAR | Status: DC | PRN
  Administered 2018-03-25: 1 mg via INTRAVENOUS
  Filled 2018-03-25: qty 1

## 2018-03-25 MED ORDER — LORAZEPAM 2 MG/ML PO CONC: 1.0000 mg | ORAL | Status: DC | PRN

## 2018-03-25 NOTE — Progress Notes (Addendum)
[facility] INPATIENT PROGRESS NOTE  SUBJECTIVE: Nancy Norris 78 y.o. female followed by Dr. Julien Nordmann for multiple myeloma.  She has a new diagnosis of gastric adenocarcinoma.  She was hospitalized recently with bilateral cortical and subcortical infarcts with suspected new left middle cerebral artery infarction.  During her hospitalization she was noted to have an acute deep vein thrombosis.  The patient also had an abnormal barium swallow and possible GI bleed.  Upper endoscopy showed a large fungating mass in the proximal stomach. Biopsy consistent with adenocarcinoma. Anticoagulation was held due to risk of GI bleed. She received Aspirin 325 mg only.  She had an IVC filter placed on 03/09/2018.  The patient was discharged to inpatient rehabilitation on 03/11/2018 and on 03/12/2018 was found to have facial droop, slurred speech, and right hemiparesis.  She was transferred to [facility] and underwent a successful left common carotid arteriogram with complete revascularization of occluded left MCA, distal M1 with 2 passes with 5 mm x 33 mm embotrap retriever achieving TICI 3 revascularization with Dr. Estanislado Pandy on 03/12/2018.  The patient was discharged back to inpatient rehabilitation.  No family was at bedside today.  Patient is resting in her bed.  When asked questions she responds "uh-huh."  I spoke with nursing who tells me that they have seen a significant decline in her functional status even since the end of last week.  She has not participating in therapy.  She stays in bed most of the day.  According to nursing, she has not had any complaints.  They have not noticed any bleeding.  Palliative care has been consulted and are talking with the patient and her daughter about goals of care.  ALLERGIES:  is allergic to codeine; latex; chocolate; onion; zyprexa [olanzapine]; adhesive [tape]; heparin; hydrocodone; iodinated diagnostic agents; oxycodone; promethazine-dm; and sulfa  antibiotics.  MEDICATIONS:  Current Facility-Administered Medications  Medication Dose Route Frequency Provider Last Rate Last Dose  . 0.9 %  sodium chloride infusion   Intravenous Continuous Jamse Arn, MD   Stopped at 03/25/18 [phone]  . acetaminophen (TYLENOL) tablet 650 mg  650 mg Oral Q4H PRN Cathlyn Parsons, PA-C   650 mg at 03/24/18 0913  . albuterol (PROVENTIL) (2.5 MG/3ML) 0.083% nebulizer solution 3 mL  3 mL Inhalation Q6H PRN Angiulli, Lavon Paganini, PA-C      . alum & mag hydroxide-simeth (MAALOX/MYLANTA) 200-200-20 MG/5ML suspension 15 mL  15 mL Oral Q4H PRN Kirsteins, Luanna Salk, MD   15 mL at 03/23/18 0830  . apixaban (ELIQUIS) tablet 5 mg  5 mg Oral BID Cathlyn Parsons, PA-C   Stopped at 03/24/18 1194  . atorvastatin (LIPITOR) tablet 20 mg  20 mg Oral q1800 AngiulliLavon Paganini, PA-C   20 mg at 03/24/18 1749  . bisacodyl (DULCOLAX) suppository 10 mg  10 mg Rectal Daily PRN Jamse Arn, MD      . buPROPion [facility]) tablet 75 mg  75 mg Oral BID Cathlyn Parsons, PA-C   75 mg at 03/25/18 1740  . citalopram (CELEXA) tablet 40 mg  40 mg Oral Daily Cathlyn Parsons, PA-C   40 mg at 03/25/18 8144  . cromolyn (OPTICROM) 4 % ophthalmic solution 1-2 drop  1-2 drop Both Eyes QID PRN Angiulli, Lavon Paganini, PA-C      . feeding supplement (ENSURE ENLIVE) (ENSURE ENLIVE) liquid 237 mL  237 mL Oral BID BM Kirsteins, Luanna Salk, MD   237 mL at 03/24/18 1420  .  folic acid (FOLVITE) tablet 1 mg  1 mg Oral Daily Cathlyn Parsons, PA-C   1 mg at 03/25/18 9485  . ipratropium-albuterol (DUONEB) 0.5-2.5 (3) MG/3ML nebulizer solution 3 mL  3 mL Nebulization Q4H PRN Angiulli, Lavon Paganini, PA-C      . levothyroxine (SYNTHROID, LEVOTHROID) tablet 150 mcg  150 mcg Oral QAC breakfast Cathlyn Parsons, PA-C   150 mcg at 03/25/18 0548  . midodrine (PROAMATINE) tablet 10 mg  10 mg Oral Daily Cathlyn Parsons, PA-C   10 mg at 03/25/18 4627  . ondansetron (ZOFRAN) injection 2 mg  2 mg Intravenous Q4H  PRN Jamse Arn, MD   2 mg at 03/24/18 0514  . pantoprazole (PROTONIX) EC tablet 40 mg  40 mg Oral Daily Cathlyn Parsons, PA-C   40 mg at 03/25/18 0350  . polyethylene glycol (MIRALAX / GLYCOLAX) packet 17 g  17 g Oral BID Jamse Arn, MD   17 g at 03/23/18 0834  . potassium chloride SA (K-DUR,KLOR-CON) CR tablet 20 mEq  20 mEq Oral BID Jamse Arn, MD   20 mEq at 03/25/18 0938  . senna-docusate (Senokot-S) tablet 2 tablet  2 tablet Oral QHS Charlett Blake, MD   2 tablet at 03/23/18 2052  . sodium chloride flush (NS) 0.9 % injection 10-40 mL  10-40 mL Intracatheter PRN Kirsteins, Luanna Salk, MD      . topiramate (TOPAMAX) tablet 25 mg  25 mg Oral QHS Meredith Staggers, MD   25 mg at 03/24/18 2100  . traMADol (ULTRAM) tablet 25 mg  25 mg Oral Q4H PRN Jamse Arn, MD   25 mg at 03/24/18 0514  . vitamin B-12 (CYANOCOBALAMIN) tablet 1,000 mcg  1,000 mcg Oral Daily Cathlyn Parsons, PA-C   1,000 mcg at 03/25/18 1829    REVIEW OF SYSTEMS:   Review of Systems  Unable to perform ROS: Medical condition  The patient is unable to answer my questions today.  PHYSICAL EXAMINATION:  Vital signs in last 24 hours: Temp:  [97.7 F (36.5 C)-98.8 F (37.1 C)] 98 F (36.7 C) (03/10 0352) Pulse Rate:  [65-72] 68 (03/10 0352) Resp:  [16-20] 17 (03/10 0352) BP: (120-139)/(60-72) 122/60 (03/10 0352) SpO2:  [97 %-100 %] 98 % (03/09 2102) Weight change:  Last BM Date: 03/24/18  ECOG PERFORMANCE STATUS: 3 - Symptomatic, >50% confined to bed  Intake/Output from previous day: 03/09 0701 - 03/10 0700 In: 961 [P.O.:210; Blood:751] Out: -  General: Resting in bed.  Arousable. Head: Normocephalic atraumatic. Mouth: mucous membranes moist, pharynx normal without lesions Eyes: No scleral icterus.  Pupils are equal and round reactive to light. Resp: clear to auscultation bilaterally without rhonchi or wheezes or dullness to percussion. Cardio: regular rate and rhythm, S1, S2  normal, no murmur, click, rub or gallop GI: soft, non-tender; bowel sounds normal; no masses,  no organomegaly Musculoskeletal: No joint deformity or effusion. Neurological: Bilateral upper and bilateral lower extremity symmetrical 4/5.  Skin: No rashes or lesions.  LABORATORY DATA: Lab Results  Component Value Date   WBC 5.8 03/25/2018   HGB 10.9 (L) 03/25/2018   HCT 33.3 (L) 03/25/2018   MCV 94.3 03/25/2018   PLT 48 (L) 03/25/2018   CMP Latest Ref Rng & Units 03/24/2018 03/23/2018 03/20/2018  Glucose 70 - 99 mg/dL 102(H) 99 116(H)  BUN 8 - 23 mg/dL 24(H) 28(H) 16  Creatinine 0.44 - 1.00 mg/dL 2.36(H) 2.49(H) 2.59(H)  Sodium 135 - 145 mmol/L  138 135 137  Potassium 3.5 - 5.1 mmol/L 3.7 3.1(L) 3.8  Chloride 98 - 111 mmol/L 112(H) 105 108  CO2 22 - 32 mmol/L 18(L) 20(L) 22  Calcium 8.9 - 10.3 mg/dL 8.2(L) 8.1(L) 8.9  Total Protein 6.5 - 8.1 g/dL - - -  Total Bilirubin 0.3 - 1.2 mg/dL - - -  Alkaline Phos 38 - 126 U/L - - -  AST 15 - 41 U/L - - -  ALT 0 - 44 U/L - - -   RADIOGRAPHIC STUDIES:  Ct Abdomen Pelvis Wo Contrast  Result Date: 03/22/2018 CLINICAL DATA:  Diffuse abdominal pain with nausea and vomiting. EXAM: CT ABDOMEN AND PELVIS WITHOUT CONTRAST TECHNIQUE: Multidetector CT imaging of the abdomen and pelvis was performed following the standard protocol without IV contrast. COMPARISON:  CT abdomen pelvis dated March 08, 2018. FINDINGS: Lower chest: No acute abnormality. Hepatobiliary: 2.7 cm hypodense lesion in the left hepatic lobe, slightly more conspicuous on today's study. Status post cholecystectomy. No biliary dilatation. Pancreas: Mild atrophy. No ductal dilatation or surrounding inflammatory changes. Spleen: Unchanged 2.5 cm hypodense lesion in the dome of the spleen. There is also likely a 2.2 cm hypodense lesion in the inferior spleen which is obscured by streak artifact on prior study. Adrenals/Urinary Tract: Unchanged 1.6 cm left adrenal nodule since September 2017,  likely an adenoma. Right adrenal gland is unremarkable. Unchanged cortical scarring and atrophy of both kidneys. No focal renal lesion, calculi, or hydronephrosis. Stomach/Bowel: Unchanged small hiatal hernia. Unchanged wall thickening of the gastroesophageal junction and medial proximal stomach. No small bowel or colonic wall thickening, distention, or surrounding inflammatory changes. The appendix is not visualized, but there are no signs of inflammation at the base of the cecum. Vascular/Lymphatic: Increased bulky lymphadenopathy in the gastrohepatic ligament, now measuring 5.4 x 3.3 cm, previously 4.5 x 3.2 cm. Other small nearby lymph nodes now measure 13-14 mm, previously 10-12 mm. Small portacaval lymph node has increased in size, now measuring 12 mm, previously 10 mm. Interval placement of an IVC filter with the tip just below the left renal vein. Aortoiliac atherosclerosis. Reproductive: Status post hysterectomy. No adnexal masses. Other: No abdominal wall hernia or abnormality. No abdominopelvic ascites. No pneumoperitoneum. Musculoskeletal: No acute or significant osseous findings. IMPRESSION: 1.  No acute intra-abdominal process. 2. Grossly unchanged wall thickening involving the gastroesophageal junction and medial proximal stomach, consistent with known gastric adenocarcinoma. Slightly increased bulky lymphadenopathy in the gastrohepatic ligament. 3. 2.7 cm slightly hypodense lesion in the left hepatic lobe. 2.5 and 2.2 cm hypodense lesions in the spleen. These are not well evaluated due to lack of intravenous contrast. Metastatic disease is not excluded. Electronically Signed   By: Titus Dubin M.D.   On: 03/22/2018 09:01   Ct Abdomen Pelvis Wo Contrast  Result Date: 03/08/2018 CLINICAL DATA:  Large ulcerated gastric fundal mass found on upper endoscopy today. EXAM: CT CHEST, ABDOMEN AND PELVIS WITHOUT CONTRAST TECHNIQUE: Multidetector CT imaging of the chest, abdomen and pelvis was performed  following the standard protocol without IV contrast. COMPARISON:  CT chest 01/20/2018 FINDINGS: CT CHEST FINDINGS Cardiovascular: The heart size is normal. No substantial pericardial effusion. Coronary artery calcification is evident. Atherosclerotic calcification is noted in the wall of the thoracic aorta. Right Port-A-Cath tip is positioned in the distal SVC near the junction with the RA. Mediastinum/Nodes: 12 mm short axis prevascular node seen on 17/3. Scattered small central mediastinal lymph nodes identified in the upper mediastinum. 12 mm short axis retrotracheal node visible  on 16/3. No subcarinal or lower paraesophageal lymphadenopathy. No evidence for gross hilar lymphadenopathy although assessment is limited by the lack of intravenous contrast on today's study. There is no axillary lymphadenopathy. Lungs/Pleura: The central tracheobronchial airways are patent. 19 mm ring-like lesion in the right upper lobe is stable and may represent airway impaction with associated tree-in-bud nodularity. 6 mm medial right upper lobe nodule visible on 67/4. 5 mm right middle lobe nodule visible on 102/4, stable. 1.9 x 1.2 cm irregular opacity in the posterior left upper lobe is at the site of the 4.2 x 3.8 cm mass seen on PET-CT of 10/11/2015 and may reflect residual scarring. Small left pleural effusion evident. Musculoskeletal: No worrisome lytic or sclerotic osseous abnormality. CT ABDOMEN PELVIS FINDINGS Hepatobiliary: No focal abnormality in the liver on this study without intravenous contrast. Gallbladder surgically absent. No intrahepatic or extrahepatic biliary dilation. Pancreas: No focal mass lesion. No dilatation of the main duct. No intraparenchymal cyst. No peripancreatic edema. Spleen: Subtle 2.7 x 2.0 cm hypoattenuating lesion is identified in the dome of spleen, not definitely seen on prior. Adrenals/Urinary Tract: Right adrenal gland unremarkable. 1.6 cm left adrenal nodule stable since PET-CT of  10/01/2015. Cortical scarring noted in both kidneys. No evidence for hydroureter. The urinary bladder appears normal for the degree of distention. Stomach/Bowel: Tiny hiatal hernia noted. Wall thickening is noted in the esophagogastric junction and proximal stomach. Wall thickening extends along the medial aspect of the proximal stomach. Distal stomach unremarkable. Duodenum is normally positioned as is the ligament of Treitz. No small bowel wall thickening. No small bowel dilatation. The terminal ileum is normal. The appendix is not visualized, but there is no edema or inflammation in the region of the cecum. No gross colonic mass. No colonic wall thickening. Vascular/Lymphatic: There is abdominal aortic atherosclerosis without aneurysm. Bulky lymphadenopathy is seen in the hepato duodenal ligament. 3.2 x 4.5 cm lymph node seen on 55/3. The 2.9 x 2.8 cm lymph node seen on 57/3. 10 mm short axis portal caval lymph node (63/3) is suspicious. No para-aortic retroperitoneal lymphadenopathy. No pelvic sidewall lymphadenopathy. Reproductive: The uterus is surgically absent. There is no adnexal mass. Other: No intraperitoneal free fluid. Musculoskeletal: No worrisome lytic or sclerotic osseous abnormality. IMPRESSION: 1. Marked wall thickening in the esophagogastric junction and proximal stomach, consistent with known neoplasm. Bulky lymphadenopathy in the gastrohepatic ligament is compatible with metastatic disease. 10 mm short axis portal caval lymph node also suspicious. 2. 2.7 cm hypoattenuating lesion in the spleen. This is not well assessed on today's noncontrast exam. Close attention recommended as metastatic disease not excluded. 3. Scattered tiny pulmonary nodules measuring up to 5 mm. Nonspecific, but close attention recommended as metastatic disease not excluded. 4. Mild lymphadenopathy noted in the upper mediastinum. 5. Circular lesion right upper lobe on axial imaging has a branching configuration on coronal  and sagittal images. This is probably airway impaction related to atypical infection. Close follow-up recommended. 6. 1.9 x 1.2 cm irregular lesion posterior left upper lobe at the site of a 4 cm mass on PET-CT of 10/11/2015. Imaging features today are compatible with residua of that lesion. 7. 1.6 cm left adrenal nodule stable since 10/11/2015, likely benign. 8.  Aortic Atherosclerois (ICD10-170.0) Electronically Signed   By: Misty Stanley M.D.   On: 03/08/2018 19:19   Ct Angio Head W Or Wo Contrast  Result Date: 03/12/2018 CLINICAL DATA:  Stroke, follow-up. Recent acute nonhemorrhagic infarcts involving the medial left occipital lobe and bilateral parietal lobes.  Punctate acute infarct in the posterior left frontal lobe. EXAM: CT ANGIOGRAPHY HEAD AND NECK TECHNIQUE: Multidetector CT imaging of the head and neck was performed using the standard protocol during bolus administration of intravenous contrast. Multiplanar CT image reconstructions and MIPs were obtained to evaluate the vascular anatomy. Carotid stenosis measurements (when applicable) are obtained utilizing NASCET criteria, using the distal internal carotid diameter as the denominator. CONTRAST:  20m ISOVUE-370 IOPAMIDOL (ISOVUE-370) INJECTION 76% COMPARISON:  MRI of the brain and MRA circle-of-Willis 03/03/2018. CT head without contrast 03/12/2018. FINDINGS: CTA NECK FINDINGS Aortic arch: A 3 vessel arch configuration is present. Atherosclerotic changes are noted at the origin of the left subclavian artery without significant stenosis. Additional calcifications are present in the distal arch. There is no aneurysm. Right carotid system: The right common carotid artery is tortuous proximally. Minimal atherosclerotic changes are noted at the right carotid bifurcation. There is no significant stenosis. There is mild tortuosity of the cervical right ICA without significant stenosis. Left carotid system: The left common carotid artery is mildly tortuous  without significant stenosis. The bifurcation is unremarkable. There is moderate tortuosity of the more distal cervical left ICA without a significant stenosis. Vertebral arteries: The left vertebral artery is slightly dominant to the right. Both vertebral arteries originate from the subclavian arteries without significant stenosis. There is no significant stenosis or vascular injury to either vertebral artery in the neck. Skeleton: Levoconvex curvature is present in the lower cervical spine. Grade 1 degenerative anterolisthesis is present at C4-5. Uncovertebral disease is present bilaterally at C5-6 and C6-7. Other neck: The soft tissues of the neck demonstrate moderate atrophy of the thyroid. Salivary glands are within normal limits bilaterally. Mediastinal and thoracic inlet adenopathy again noted, recent described on CT of the chest. Upper chest: A ring-like lesion in the right upper lobe measures 18 mm, stable. No other focal nodules are visualized. Centrilobular emphysematous changes are present. Review of the MIP images confirms the above findings CTA HEAD FINDINGS Anterior circulation: Atherosclerotic calcifications are present within the cavernous internal carotid arteries bilaterally. There is no significant stenosis relative to the ICA termini. The A1 and M1 segments are normal. Anterior communicating artery is patent. MCA bifurcations are intact bilaterally. There is a high-grade stenosis or nonocclusive thrombus involving the superior left M2 division proximally. Posterior circulation: Left vertebral artery is dominant. PICA origins are visualized and normal. Both posterior cerebral arteries originate from basilar tip. A left posterior communicating artery contributes. PCA branch vessels are within normal limits bilaterally. Venous sinuses: Dural sinuses are patent. Straight sinus deep cerebral veins are intact. Cortical veins are within normal limits. Anatomic variants: None Review of the MIP images  confirms the above findings IMPRESSION: 1. High-grade stenosis versus nonocclusive thrombus involving the proximal left superior M2 division. 2. No other focal stenosis or occlusion. 3. No aneurysm. 4. Tortuosity of the cervical vasculature without significant stenosis. 5. Aortic Atherosclerosis (ICD10-I70.0). No aneurysm or stenosis at the aortic arch. Electronically Signed   By: CSan MorelleM.D.   On: 03/12/2018 15:37   Dg Chest 2 View  Result Date: 03/20/2018 CLINICAL DATA:  LEFT-sided weakness. EXAM: CHEST - 2 VIEW COMPARISON:  Chest x-ray dated 03/03/2018. FINDINGS: Heart size and mediastinal contours are stable. RIGHT chest wall Port-A-Cath is stable in position with tip at the level of the lower SVC/cavoatrial junction. Mild scarring/atelectasis again noted within the LEFT mid lung. Lungs are otherwise clear. No pleural effusion or pneumothorax seen. No acute or suspicious osseous finding. IMPRESSION: No  active cardiopulmonary disease. No evidence of pneumonia or pulmonary edema. Electronically Signed   By: Franki Cabot M.D.   On: 03/20/2018 19:52   Dg Chest 2 View  Result Date: 03/03/2018 CLINICAL DATA:  Weakness, dizziness and confusion over the last 2 weeks. History of renal cell cancer and myeloma. EXAM: CHEST - 2 VIEW COMPARISON:  02/26/2018 radiography.  CT 01/20/2018. FINDINGS: Heart size is normal. Chronic aortic atherosclerosis. Power port inserted from a right jugular approach has its tip at the SVC RA junction. The lungs are clear except for mild scarring. No sign of active infiltrate, mass, effusion or collapse. IMPRESSION: No active cardiopulmonary disease. Electronically Signed   By: Nelson Chimes M.D.   On: 03/03/2018 12:58   Ct Head Wo Contrast  Result Date: 03/20/2018 CLINICAL DATA:  78 y/o F; Headache, acute, severe, worst HA of life. EXAM: CT HEAD WITHOUT CONTRAST TECHNIQUE: Contiguous axial images were obtained from the base of the skull through the vertex without  intravenous contrast. COMPARISON:  03/13/2018 MRI of the head. 03/12/2018 CTA of the head. FINDINGS: Brain: Infarcts within the left parietal lobe and basal ganglia as well as very small infarcts in the right parietal lobe stable in distribution from prior MRI with interval decrease in local edema and mass effect. Small chronic infarcts are present in the left occipital lobe and right superior cerebellum. Stable background of moderate chronic microvascular ischemic changes and volume loss of brain. No new acute stroke, hemorrhage, extra-axial collection, hydrocephalus, or mass effect. Vascular: Calcific atherosclerosis of carotid siphons. No hyperdense vessel identified. Skull: Normal. Negative for fracture or focal lesion. Sinuses/Orbits: No acute finding. Other: None. IMPRESSION: 1. No acute intracranial abnormality identified. 2. Stable distribution of recent infarcts in the left-greater-than-right parietal lobe and left basal ganglia from prior MRI. Decreased edema and local mass effect. 3. Stable background of moderate chronic microvascular ischemic changes, parenchymal volume loss of the brain, and chronic infarctions. Electronically Signed   By: Kristine Garbe M.D.   On: 03/20/2018 19:09   Ct Head Wo Contrast  Result Date: 03/05/2018 CLINICAL DATA:  Headache beginning this afternoon. History of multiple myeloma, thrombocytopenia and stroke. EXAM: CT HEAD WITHOUT CONTRAST TECHNIQUE: Contiguous axial images were obtained from the base of the skull through the vertex without intravenous contrast. COMPARISON:  MRI of the head March 03, 2018 and CT HEAD March 03, 2018. FINDINGS: BRAIN: Patchy LEFT > RIGHT parietal and LEFT occipital hypodensities are increasingly conspicuous from prior CT. No propagation. No intraparenchymal hemorrhage, mass effect or midline shift. Patchy LEFT frontal white matter hypodensities most compatible with chronic small vessel ischemic changes. Old small LEFT  cerebellar infarct. No parenchymal brain volume loss for age. No hydrocephalus. No abnormal extra-axial fluid collections. VASCULAR: Moderate calcific atherosclerosis of the carotid siphons. SKULL: No skull fracture. Moderate temporomandibular osteoarthrosis. No significant scalp soft tissue swelling. SINUSES/ORBITS: Trace paranasal sinus mucosal thickening. Mastoid air cells are well aerated.The included ocular globes and orbital contents are non-suspicious. Status post bilateral ocular lens implants. OTHER: None. IMPRESSION: 1. Involving acute LEFT > RIGHT parietal and LEFT occipital lobe infarcts without hemorrhagic conversion. 2. Mild-to-moderate chronic small vessel ischemic changes. Old small LEFT cerebellar infarct. Electronically Signed   By: Elon Alas M.D.   On: 03/05/2018 17:25   Ct Head Wo Contrast  Result Date: 03/03/2018 CLINICAL DATA:  Confused. Altered level of consciousness. Breast cancer. EXAM: CT HEAD WITHOUT CONTRAST TECHNIQUE: Contiguous axial images were obtained from the base of the skull through the vertex  without intravenous contrast. COMPARISON:  02/26/2018. FINDINGS: Brain: Two separate areas of ill-defined abnormal cortical and subcortical edema in the LEFT hemisphere, affecting the LEFT parietal and LEFT occipital lobes, could represent multifocal infarcts versus rapid progression of metastatic disease. These areas were previously unremarkable on CT 5 days earlier. MRI of the brain without and with contrast recommended for further evaluation. Elsewhere, mild to moderate atrophy. Hypoattenuation of white matter, consistent with small vessel disease/post treatment effect. Vascular: Calcification of the cavernous internal carotid arteries consistent with cerebrovascular atherosclerotic disease. No signs of intracranial large vessel occlusion. Skull: Calvarium intact. Sinuses/Orbits: No sinus disease. Negative orbits. Other: None. IMPRESSION: Two separate areas of abnormal  cortical and subcortical edema in the LEFT hemisphere, affecting the LEFT parietal and LEFT occipital lobes, could represent multifocal infarcts versus rapid progression of metastatic disease. These were not present on the previous CT from 02/26/2018. MRI of the brain without and with contrast recommended for further evaluation. Electronically Signed   By: Staci Righter M.D.   On: 03/03/2018 13:26   Ct Head Wo Contrast  Result Date: 02/26/2018 CLINICAL DATA:  Blackouts for 1 month. History of multiple myeloma. EXAM: CT HEAD WITHOUT CONTRAST TECHNIQUE: Contiguous axial images were obtained from the base of the skull through the vertex without intravenous contrast. COMPARISON:  12/22/2015. FINDINGS: Brain: No evidence for acute infarction, hemorrhage, mass lesion, hydrocephalus, or extra-axial fluid. Mild atrophy. Hypoattenuation of white matter, consistent with small vessel disease or post treatment effect. 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 Vascular: Calcification of the cavernous internal carotid arteries consistent with cerebrovascular atherosclerotic disease. No signs of intracranial large vessel occlusion. Skull: Calvarium intact. Small lucency in the RIGHT frontal bone (series 3 image 50) unchanged from 2017. Sinuses/Orbits: No acute finding. Other: None. IMPRESSION: Atrophy and small vessel disease. No acute intracranial findings. Electronically Signed   By: Staci Righter M.D.   On: 02/26/2018 18:23   Ct Angio Neck W Or Wo Contrast  Result Date: 03/12/2018 CLINICAL DATA:  Stroke, follow-up. Recent acute nonhemorrhagic infarcts involving the medial left occipital lobe and bilateral parietal lobes. Punctate acute infarct in the posterior left frontal lobe. EXAM: CT ANGIOGRAPHY HEAD AND NECK TECHNIQUE: Multidetector CT imaging of the head and neck was performed using the standard protocol during bolus administration of intravenous contrast. Multiplanar CT image reconstructions and  MIPs were obtained to evaluate the vascular anatomy. Carotid stenosis measurements (when applicable) are obtained utilizing NASCET criteria, using the distal internal carotid diameter as the denominator. CONTRAST:  33m ISOVUE-370 IOPAMIDOL (ISOVUE-370) INJECTION 76% COMPARISON:  MRI of the brain and MRA [place] 03/03/2018. CT head without contrast 03/12/2018. FINDINGS: CTA NECK FINDINGS Aortic arch: A 3 vessel arch configuration is present. Atherosclerotic changes are noted at the origin of the left subclavian artery without significant stenosis. Additional calcifications are present in the distal arch. There is no aneurysm. Right carotid system: The right common carotid artery is tortuous proximally. Minimal atherosclerotic changes are noted at the right carotid bifurcation. There is no significant stenosis. There is mild tortuosity of the cervical right ICA without significant stenosis. Left carotid system: The left common carotid artery is mildly tortuous without significant stenosis. The bifurcation is unremarkable. There is moderate tortuosity of the more distal cervical left ICA without a significant stenosis. Vertebral arteries: The left vertebral artery is slightly dominant to the right. Both vertebral arteries originate from the subclavian arteries without significant stenosis. There is no significant stenosis or vascular injury to either vertebral artery in the  neck. Skeleton: Levoconvex curvature is present in the lower cervical spine. Grade 1 degenerative anterolisthesis is present at C4-5. Uncovertebral disease is present bilaterally at C5-6 and C6-7. Other neck: The soft tissues of the neck demonstrate moderate atrophy of the thyroid. Salivary glands are within normal limits bilaterally. Mediastinal and thoracic inlet adenopathy again noted, recent described on CT of the chest. Upper chest: A ring-like lesion in the right upper lobe measures 18 mm, stable. No other focal nodules are  visualized. Centrilobular emphysematous changes are present. Review of the MIP images confirms the above findings CTA HEAD FINDINGS Anterior circulation: Atherosclerotic calcifications are present within the cavernous internal carotid arteries bilaterally. There is no significant stenosis relative to the ICA termini. The A1 and M1 segments are normal. Anterior communicating artery is patent. MCA bifurcations are intact bilaterally. There is a high-grade stenosis or nonocclusive thrombus involving the superior left M2 division proximally. Posterior circulation: Left vertebral artery is dominant. PICA origins are visualized and normal. Both posterior cerebral arteries originate from basilar tip. A left posterior communicating artery contributes. PCA branch vessels are within normal limits bilaterally. Venous sinuses: Dural sinuses are patent. Straight sinus deep cerebral veins are intact. Cortical veins are within normal limits. Anatomic variants: None Review of the MIP images confirms the above findings IMPRESSION: 1. High-grade stenosis versus nonocclusive thrombus involving the proximal left superior M2 division. 2. No other focal stenosis or occlusion. 3. No aneurysm. 4. Tortuosity of the cervical vasculature without significant stenosis. 5. Aortic Atherosclerosis (ICD10-I70.0). No aneurysm or stenosis at the aortic arch. Electronically Signed   By: San Morelle M.D.   On: 03/12/2018 15:37   Ct Chest Wo Contrast  Result Date: 03/08/2018 CLINICAL DATA:  Large ulcerated gastric fundal mass found on upper endoscopy today. EXAM: CT CHEST, ABDOMEN AND PELVIS WITHOUT CONTRAST TECHNIQUE: Multidetector CT imaging of the chest, abdomen and pelvis was performed following the standard protocol without IV contrast. COMPARISON:  CT chest 01/20/2018 FINDINGS: CT CHEST FINDINGS Cardiovascular: The heart size is normal. No substantial pericardial effusion. Coronary artery calcification is evident. Atherosclerotic  calcification is noted in the wall of the thoracic aorta. Right Port-A-Cath tip is positioned in the distal SVC near the junction with the RA. Mediastinum/Nodes: 12 mm short axis prevascular node seen on 17/3. Scattered small central mediastinal lymph nodes identified in the upper mediastinum. 12 mm short axis retrotracheal node visible on 16/3. No subcarinal or lower paraesophageal lymphadenopathy. No evidence for gross hilar lymphadenopathy although assessment is limited by the lack of intravenous contrast on today's study. There is no axillary lymphadenopathy. Lungs/Pleura: The central tracheobronchial airways are patent. 19 mm ring-like lesion in the right upper lobe is stable and may represent airway impaction with associated tree-in-bud nodularity. 6 mm medial right upper lobe nodule visible on 67/4. 5 mm right middle lobe nodule visible on 102/4, stable. 1.9 x 1.2 cm irregular opacity in the posterior left upper lobe is at the site of the 4.2 x 3.8 cm mass seen on PET-CT of 10/11/2015 and may reflect residual scarring. Small left pleural effusion evident. Musculoskeletal: No worrisome lytic or sclerotic osseous abnormality. CT ABDOMEN PELVIS FINDINGS Hepatobiliary: No focal abnormality in the liver on this study without intravenous contrast. Gallbladder surgically absent. No intrahepatic or extrahepatic biliary dilation. Pancreas: No focal mass lesion. No dilatation of the main duct. No intraparenchymal cyst. No peripancreatic edema. Spleen: Subtle 2.7 x 2.0 cm hypoattenuating lesion is identified in the dome of spleen, not definitely seen on prior. Adrenals/Urinary Tract:  Right adrenal gland unremarkable. 1.6 cm left adrenal nodule stable since PET-CT of 10/01/2015. Cortical scarring noted in both kidneys. No evidence for hydroureter. The urinary bladder appears normal for the degree of distention. Stomach/Bowel: Tiny hiatal hernia noted. Wall thickening is noted in the esophagogastric junction and proximal  stomach. Wall thickening extends along the medial aspect of the proximal stomach. Distal stomach unremarkable. Duodenum is normally positioned as is the ligament of Treitz. No small bowel wall thickening. No small bowel dilatation. The terminal ileum is normal. The appendix is not visualized, but there is no edema or inflammation in the region of the cecum. No gross colonic mass. No colonic wall thickening. Vascular/Lymphatic: There is abdominal aortic atherosclerosis without aneurysm. Bulky lymphadenopathy is seen in the hepato duodenal ligament. 3.2 x 4.5 cm lymph node seen on 55/3. The 2.9 x 2.8 cm lymph node seen on 57/3. 10 mm short axis portal caval lymph node (63/3) is suspicious. No para-aortic retroperitoneal lymphadenopathy. No pelvic sidewall lymphadenopathy. Reproductive: The uterus is surgically absent. There is no adnexal mass. Other: No intraperitoneal free fluid. Musculoskeletal: No worrisome lytic or sclerotic osseous abnormality. IMPRESSION: 1. Marked wall thickening in the esophagogastric junction and proximal stomach, consistent with known neoplasm. Bulky lymphadenopathy in the gastrohepatic ligament is compatible with metastatic disease. 10 mm short axis portal caval lymph node also suspicious. 2. 2.7 cm hypoattenuating lesion in the spleen. This is not well assessed on today's noncontrast exam. Close attention recommended as metastatic disease not excluded. 3. Scattered tiny pulmonary nodules measuring up to 5 mm. Nonspecific, but close attention recommended as metastatic disease not excluded. 4. Mild lymphadenopathy noted in the upper mediastinum. 5. Circular lesion right upper lobe on axial imaging has a branching configuration on coronal and sagittal images. This is probably airway impaction related to atypical infection. Close follow-up recommended. 6. 1.9 x 1.2 cm irregular lesion posterior left upper lobe at the site of a 4 cm mass on PET-CT of 10/11/2015. Imaging features today are  compatible with residua of that lesion. 7. 1.6 cm left adrenal nodule stable since 10/11/2015, likely benign. 8.  Aortic Atherosclerois (ICD10-170.0) Electronically Signed   By: Misty Stanley M.D.   On: 03/08/2018 19:19   [name] Head Wo Contrast  Result Date: 03/13/2018 CLINICAL DATA:  Stroke follow-up EXAM: MRI HEAD WITHOUT CONTRAST MRA HEAD WITHOUT CONTRAST TECHNIQUE: Multiplanar, multiecho pulse sequences of the brain and surrounding structures were obtained without intravenous contrast. Angiographic images of the head were obtained using MRA technique without contrast. An abbreviated protocol was requested by the referring clinician, including time-of-flight MRA, axial diffusion-weighted imaging and axial susceptibility weighted imaging. COMPARISON:  None. FINDINGS: MRI HEAD FINDINGS BRAIN: There is abnormal diffusion restriction within the posterior left parietal lobe, with mild diffusion restriction in the left basal ganglia. There is a small focus of mild diffusion restriction in the posterior right parietal lobe. No midline shift or other mass effect. Susceptibility weighted imaging shows no hemorrhage. MRA HEAD FINDINGS POSTERIOR CIRCULATION: --Basilar artery: Normal. --Posterior cerebral arteries: Normal. Both originate from the basilar artery. --Superior cerebellar arteries: Normal. --Inferior cerebellar arteries: Normal anterior and posterior inferior cerebellar arteries. ANTERIOR CIRCULATION: --Intracranial internal carotid arteries: Normal. --Anterior cerebral arteries: Normal. Both A1 segments are present. Patent anterior communicating artery. --Middle cerebral arteries: Normal. --Posterior communicating arteries: Present on the left, absent on the right. IMPRESSION: 1. Multifocal acute ischemia within the posterior left MCA distribution and within the left basal ganglia. 2. Small foci of ischemia within the right parietal  lobe. 3. Normal intracranial MRA. Specifically, normal flow related  enhancement of the left MCA and its branches. Electronically Signed   By: Ulyses Jarred M.D.   On: 03/13/2018 17:23   Mr Brain Wo Contrast  Result Date: 03/21/2018 CLINICAL DATA:  78 y/o F; headache, lethargy, word-finding deficit. Persistent severe headache. EXAM: MRI HEAD WITHOUT CONTRAST TECHNIQUE: Multiplanar, multiecho pulse sequences of the brain and surrounding structures were obtained without intravenous contrast. COMPARISON:  03/20/2018 CT head.  03/13/2018 MRI and MRA head. FINDINGS: Brain: Interval development of several punctate foci of reduced diffusion scattered throughout the posterior frontal, parietal, and right occipital cortices compatible with acute/early subacute infarction. Small additional areas of acute/early subacute infarction are present within the right posterior thalamus, right posterior hippocampus, left medial lentiform nucleus, the left insula (series 6, image 21). No associated hemorrhage or mass effect. Recent infarcts identified on the prior MRI of the head within left-greater-than-right parietal lobes and the left basal ganglia no demonstrate intermediate to mildly increased diffusion compatible with subacute etiology. Small chronic infarctions are present within the left superior cerebellum, left occipital lobe, left thalamus. Stable background of early confluent nonspecific T2 FLAIR hyperintensities in subcortical and periventricular white matter are compatible with moderate chronic microvascular ischemic changes. Stable moderate volume loss of the brain. No extra-axial collection, hydrocephalus, mass effect, or herniation. Vascular: Normal flow voids. Skull and upper cervical spine: Normal marrow signal. Sinuses/Orbits: Negative. Other: Bilateral intra-ocular lens replacement. IMPRESSION: 1. Several punctate acute/early subacute infarction throughout bilateral posterior frontal, bilateral parietal, and right parietal cortices. Additional small foci of acute/early subacute  infarction within right posterior thalamus, right posterior hippocampus, left medial lentiform nucleus, and left insula. No associated hemorrhage or mass effect. Multiple vascular territories suggests embolic etiology. 2. Recent infarcts identified on the prior MRI of the head within left-greater-than-right parietal lobes in the left basal ganglia now demonstrate subacute signal. 3. Stable background of moderate chronic microvascular ischemic changes, volume loss of the brain, and small chronic infarctions. These results will be called to the ordering clinician or representative by the Radiologist Assistant, and communication documented in the PACS or zVision Dashboard. Electronically Signed   By: Kristine Garbe M.D.   On: 03/21/2018 13:56   Mr Brain Wo Contrast  Result Date: 03/13/2018 CLINICAL DATA:  Stroke follow-up EXAM: MRI HEAD WITHOUT CONTRAST MRA HEAD WITHOUT CONTRAST TECHNIQUE: Multiplanar, multiecho pulse sequences of the brain and surrounding structures were obtained without intravenous contrast. Angiographic images of the head were obtained using MRA technique without contrast. An abbreviated protocol was requested by the referring clinician, including time-of-flight MRA, axial diffusion-weighted imaging and axial susceptibility weighted imaging. COMPARISON:  None. FINDINGS: MRI HEAD FINDINGS BRAIN: There is abnormal diffusion restriction within the posterior left parietal lobe, with mild diffusion restriction in the left basal ganglia. There is a small focus of mild diffusion restriction in the posterior right parietal lobe. No midline shift or other mass effect. Susceptibility weighted imaging shows no hemorrhage. MRA HEAD FINDINGS POSTERIOR CIRCULATION: --Basilar artery: Normal. --Posterior cerebral arteries: Normal. Both originate from the basilar artery. --Superior cerebellar arteries: Normal. --Inferior cerebellar arteries: Normal anterior and posterior inferior cerebellar arteries.  ANTERIOR CIRCULATION: --Intracranial internal carotid arteries: Normal. --Anterior cerebral arteries: Normal. Both A1 segments are present. Patent anterior communicating artery. --Middle cerebral arteries: Normal. --Posterior communicating arteries: Present on the left, absent on the right. IMPRESSION: 1. Multifocal acute ischemia within the posterior left MCA distribution and within the left basal ganglia. 2. Small foci of ischemia within the  right parietal lobe. 3. Normal intracranial MRA. Specifically, normal flow related enhancement of the left MCA and its branches. Electronically Signed   By: Ulyses Jarred M.D.   On: 03/13/2018 17:23   Mr Brain Wo Contrast  Result Date: 03/03/2018 CLINICAL DATA:  Sudden onset confusion. EXAM: MRI HEAD WITHOUT CONTRAST TECHNIQUE: Multiplanar, multiecho pulse sequences of the brain and surrounding structures were obtained without intravenous contrast. COMPARISON:  Head CT 03/03/2018 FINDINGS: The examination had to be discontinued prior to completion due to altered mental status and inability to follow the technologist's instructions. Coronal and axial diffusion-weighted imaging, sagittal T1-weighted imaging and axial T2-weighted imaging were obtained. There is multifocal abnormal diffusion restriction within both hemispheres, including the left frontal lobe, both parietal lobes, left occipital lobe and both cerebellar hemispheres. The largest area of diffusion abnormalities in the posterior left parietal lobe. There is no midline shift or other mass effect. The midline structures are normal. There is moderate white matter hyperintense T2-weighted signal consistent with chronic microvascular ischemia. IMPRESSION: 1. Examination discontinued early due to patient altered mental status. 2. Multifocal bilateral acute ischemia within multiple vascular territories in both hemispheres. This may indicate a central cardiac or aortic embolic source. Electronically Signed   By: Ulyses Jarred M.D.   On: 03/03/2018 15:04   Ir Ivc Filter Plmt / S&i /img Guid/mod Sed  Result Date: 03/09/2018 INDICATION: Paradoxical embolus.  Calf vein DVT.  Pulmonary embolism. EXAM: IVC FILTER,INFERIOR VENA CAVOGRAM MEDICATIONS: None. ANESTHESIA/SEDATION: Fentanyl 75 mcg IV; Versed 1 mg IV Moderate Sedation Time:  17 minutes The patient was continuously monitored during the procedure by the interventional radiology nurse under my direct supervision. FLUOROSCOPY TIME:  Fluoroscopy Time: 3 minutes 30 seconds (31 mGy). COMPLICATIONS: None immediate. PROCEDURE: Informed written consent was obtained from the patient after a thorough discussion of the procedural risks, benefits and alternatives. All questions were addressed. Maximal Sterile Barrier Technique was utilized including caps, mask, sterile gowns, sterile gloves, sterile drape, hand hygiene and skin antiseptic. A timeout was performed prior to the initiation of the procedure. The right neck was prepped with Betadine in a sterile fashion, and a sterile drape was applied covering the operative field. A sterile gown and sterile gloves were used for the procedure. The right jugular vein was noted to be patent initially with ultrasound. Under sonographic guidance, a micropuncture needle was inserted into the right jugular vein (Ultrasound image documentation was performed). It was removed over an 018 wire which was up-sized to a [place]. The sheath was inserted over the wire and into the IVC. IVC carbon dioxide venography was performed. The temporary [place] filter was then deployed in the infrarenal IVC. The sheath was removed and hemostasis was achieved with direct pressure. FINDINGS: IVC venography demonstrates renal inflow at the mid L2 level. There is no venous anomaly. There is no evidence of mega cava. Findings were corroborated with the accompanying CT abdomen. The final image demonstrates a deployed IVC filter with its tip at the lower L2 endplate.  IMPRESSION: Successful infrarenal IVC filter placement. This is a temporary filter. It can be removed or remain in place to become permanent. PLAN: This IVC filter is potentially retrievable. The patient will be assessed for filter retrieval by Interventional Radiology in approximately 8-12 weeks. Further recommendations regarding filter retrieval, continued surveillance or declaration of device permanence, will be made at that time. Electronically Signed   By: Marybelle Killings M.D.   On: 03/09/2018 12:15   [place]  Result Date: 03/14/2018 INDICATION: Acute onset of aphasia, and right-sided weakness. 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 EXAM: 1. EMERGENT LARGE VESSEL OCCLUSION THROMBOLYSIS (anterior CIRCULATION) COMPARISON:  CT angiogram of the head and neck of 03/12/2018. MEDICATIONS: Ancef 2 g IV antibiotic was administered within 1 hour of the procedure. ANESTHESIA/SEDATION: General anesthesia. CONTRAST:  Isovue 300 approximately 60 cc. FLUOROSCOPY TIME:  Fluoroscopy Time: 52 minutes 40 seconds (1618 mGy). COMPLICATIONS: None immediate. TECHNIQUE: Following a full explanation of the procedure along with the potential associated complications, an informed witnessed consent was obtained from the patient's daughter. The risks of intracranial hemorrhage of 10%, worsening neurological deficit, ventilator dependency, death and inability to revascularize were all reviewed in detail with the patient's daughter. The patient was then put under general anesthesia by the Department of Anesthesiology at [facility]. The right groin was prepped and draped in the usual sterile fashion. Thereafter using modified Seldinger technique, transfemoral access into the right common femoral artery was obtained without difficulty. Over a 0.035 inch guidewire a 5 French Pinnacle sheath was inserted. Through this, and also over a 0.035 inch guidewire a 5 [place] JB 1 catheter  was advanced to the aortic arch region and selectively positioned in the left common carotid artery. FINDINGS: The left common carotid arteriogram demonstrates the left external carotid artery at its origin to be mildly narrowed. Its branches are normal in caliber. The left internal carotid artery at the bulb is patent. There is a moderate to severe tortuosity of the mid cervical segment of the left internal carotid artery without evidence of kinking. The petrous, cavernous and supraclinoid segments are widely patent. There is a mild broad-based prominence of the inferior aspect of the proximal cavernous segment. The left anterior cerebral artery opacifies into the capillary and venous phases. There is transient cross-filling via the anterior communicating artery of the right anterior cerebral artery A2 segment and distally. The left middle cerebral artery demonstrates complete angiographic occlusion of the left A1 segment distal to the origin of the anterior temporal branch. The lateral delayed arterial and capillary phase demonstrates retrograde reconstitution of the perisylvian branches from collaterals arising from the pericallosal and callosal marginal branches. Also demonstrated is early opacification in the mid superior sagittal sinus region on the left from a middle meningeal posterior parietal branch. 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 PROCEDURE: ENDOVASCULAR COMPLETE REVASCULARIZATION OF OCCLUDED LEFT MIDDLE CEREBRAL ARTERY M1 SEGMENT WITH 2 PASSES WITH THE 5 MM X 33 MM EMBOTRAP RETRIEVAL DEVICE ACHIEVING A TICI 3 REVASCULARIZATION. The diagnostic JB 1 catheter in the left common carotid artery was then exchanged over a 0.035 inch 300 cm Rosen exchange guidewire for a 5 cm 8 [place] Neuron Max guide sheath. Good aspiration obtained from the hub of the Neuron Max sheath. A gentle control arteriogram demonstrated no evidence of intraluminal filling defects or dissections. The Neuron Max  sheath was advanced to the hub with the tip protruding into the bulb of the left internal carotid artery. Through this, in a coaxial manner and with constant heparinized saline infusion, a Jet D 135 mm intermediary catheter inside of which was a velocity 150 cm microcatheter combination was advanced over a 0.014 inch Softip Synchro micro guidewire to the distal end of the Neuron Max sheath. Using biplane roadmap technique and constant fluoroscopic guidance, the combination was navigated through the moderate to moderately severe tortuosity of the cervical left ICA into the  supraclinoid segment. The micro guidewire was then gently advanced into the occluded left middle cerebral artery dominant inferior division M2 M3 region followed by the microcatheter. Good aspiration was obtained from the hub of the microcatheter. A gentle control arteriogram performed through the hub of the microcatheter demonstrated the tip to be in safe position. This was then connected to continuous heparinized saline infusion. The Jet D catheter could only be advanced to the distal cavernous segment. The Embotrap retrieval device was then deployed in the defined positions from distal to proximal by unsheathing the microcatheter. A control arteriogram performed through the Jet D intermediary catheter in the left internal carotid artery demonstrated a TICI 2b revascularization. Thereafter, with constant aspiration being applied using the [place]umb[place] aspiration device, the combination of the retrieval device the microcatheter and the Jet D intermediary catheter was retrieved and removed as aspiration was continued. Free aspiration of blood was noted at the hub of the Neuron Max sheath. A gentle control arteriogram performed through the sheath demonstrated continued occlusion of the left middle cerebral artery. Following this, a combination of a 95 cm FlowGate balloon guide catheter which had been prepped with 50% contrast and 50% heparinized saline  infusion was advanced and positioned in the proximal 1/3 of the left cervical ICA. Free aspiration was noted from the hub of the [facility] guide catheter. A control arteriogram performed through this in the proximal left internal carotid artery demonstrated no change in the extracranial or intracranial circulation. A combination of a 6 French 132 cm Catalyst guide catheter inside of which was an [employer] microcatheter was advanced over a 0.014 inch Softip Synchro micro guidewire to the supraclinoid left ICA. The micro guidewire was then advanced into the dominant M2 M3 region of the inferior division of the left middle cerebral artery. This was then followed by the microcatheter. The guidewire was removed. After having verified safe position of tip of the microcatheter, the 5 mm x 33 mm Embotrap retrieval device was then deployed in the manner as described above. The 132 cm 6 [place] Catalyst guide catheter was advanced and engaged into the proximal aspect of the clot itself. Thereafter as constant aspiration was applied with a Peumbra aspiration device at the hub of the 6 [place] Catalyst guide catheter, proximal flow arrest was established by inflating the balloon of the [facility] guide catheter in the left internal carotid artery. As constant aspiration was applied at the hub of the of FlowGate guide catheter with a 60 mL syringe, and the Penumbra suction device at the hub of the Catalyst guide catheter the combination of the retrieval device, the microcatheter and the Catalyst guide catheter were retrieved and removed as constant aspiration was continued at the hub of the [facility] guide catheter with a 60 mL syringe. Following deflation, free aspiration was obtained as well as free back bleed at the hub of the [facility] guide catheter. A control arteriogram performed through the [facility] guide catheter in the proximal left internal carotid artery demonstrated complete revascularization of the previously  occluded left middle cerebral artery. Patency of the anterior cerebral artery circulation involving the anterior cerebral artery and the middle cerebral artery was established. Throughout the procedure, the patient's blood pressure and neurological status remained stable. No angiographic evidence of extravasation or of mass-effect was noted. The [facility] guide catheter and the neurovascular sheath were then retrieved into the abdominal aorta and exchanged over a J-tip guidewire for an 8 French Pinnacle sheath. This in turn was removed with the  successful application of a 7 [place] ExoSeal closure device with hemostasis in the right groin puncture site. The right groin appeared soft without evidence of a hematoma or bleeding. Distal pulses remained Dopplerable in the dorsalis pedis, and posterior regions bilaterally. An immediate post treatment CT of the brain revealed no evidence of a hemorrhage, or mass effect or midline shift. There appeared to be contrast stasis in the caudate head of the left basal ganglia, and also the anterior aspect of the putamen. The patient's general anesthesia was then reversed. The patient was extubated. Upon recovery the patient was able to breathe on her own and move all 4 limbs spontaneously and to command. She was then transferred to the neuro ICU to continue post thrombectomy management. IMPRESSION: Status post endovascular complete revascularization of occluded left middle cerebral artery M1 segment with 2 passes of the 5 mm x 33 mm Embotrap retrieval device achieving a TICI 3 revascularization. PLAN: Follow-up in the out patient clinic 4 weeks post discharge. Electronically Signed   By: Luanne Bras M.D.   On: 03/13/2018 15:13   Dg Abd Acute 2+v W 1v Chest  Result Date: 02/26/2018 CLINICAL DATA:  Lapse in memory. EXAM: DG ABDOMEN ACUTE W/ 1V CHEST COMPARISON:  None. FINDINGS: There is no evidence of dilated bowel loops or free intraperitoneal air. No radiopaque calculi or  other significant radiographic abnormality is seen. Heart size and mediastinal contours are within normal limits. Both lungs are clear. Calcified tortuous aorta. Degenerative RIGHT SI joint. Port-A-Cath, RIGHT chest subclavian approach, tip RIGHT atrium. IMPRESSION: Negative abdominal radiographs. No acute cardiopulmonary disease. Electronically Signed   By: Staci Righter M.D.   On: 02/26/2018 18:16   Dg Swallowing Func-speech Pathology  Result Date: 03/06/2018 Objective Swallowing Evaluation: Type of Study: MBS-Modified Barium Swallow Study  Patient Details Name: SIBYL MIKULA MRN: [id number] Date of Birth: 1940-08-20 Today's Date: 03/06/2018 Time: SLP Start Time (ACUTE ONLY): 0825 -SLP Stop Time (ACUTE ONLY): 0840 SLP Time Calculation (min) (ACUTE ONLY): 15 min Past Medical History: Past Medical History: Diagnosis Date . Anemia  . Anginal pain ([place])   used NTG x 2 May 31 and 06/15/13  . Anxiety  . Arthritis  . B12 deficiency 12/04/2014 . Breast cancer ([place])  . Complication of anesthesia  . COPD (chronic obstructive pulmonary disease) ([place])  . 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 Depression  . Dizziness  . Dyspnea  . Fibromyalgia  . Fibromyalgia  . GERD (gastroesophageal reflux disease)  . Headache(784.0)  . Heart murmur  . Hemoptysis 10/21/2015 . History of blood transfusion   last one May 12  . Hx of cardiovascular stress test   LexiScan with low level exercise Myoview (02/2013): No ischemia, EF 72%; normal study . Hx of echocardiogram   a.  Echocardiogram (12/26/2012): EF 53-29%, grade 1 diastolic dysfunction;   b.  Echocardiogram (02/2013): EF 55-60%, no WMA, trivial effusion . Hyperkalemia  . Hyponatremia  . Hypotension  . Hypothyroidism  . Mucositis  . Multiple myeloma  . Myocardial infarction [facility])   in past, patient was unaware.  . Neuropathy  . Nodule of left lung 09/13/2015 . Pain in joint, pelvic region and thigh 07/07/2015 . Pneumonia   several . PONV (postoperative nausea and vomiting) 2008  after  mastestomy Past Surgical History: Past Surgical History: Procedure Laterality Date . ABDOMINAL HYSTERECTOMY  1981 . AV FISTULA PLACEMENT Left 06/19/2013  Procedure: CREATION OF LEFT ARM ARTERIOVENOUS (AV) FISTULA ;  Surgeon: Angelia Mould, MD;  Location: [facility]  OR;  Service: Vascular;  Laterality: Left; . BREAST RECONSTRUCTION   . BREAST SURGERY Right   reduction . CATARACT EXTRACTION, BILATERAL   . CHOLECYSTECTOMY  1971 . COLONOSCOPY   . EYE SURGERY Bilateral   lens implant . history of Port removal   . LUNG BIOPSY  10/21/2015 . MASTECTOMY Left 2008 . PORTACATH PLACEMENT  12/2012  has had 2 . RESECTION OF ARTERIOVENOUS FISTULA ANEURYSM Left 06/04/2017  Procedure: LIGATION ANEURYSM OF LEFT ARTERIOVENOUS FISTULA;  Surgeon: Angelia Mould, MD;  Location: [place];  Service: Vascular;  Laterality: Left; . Status post stem cell transplant on September 28, 2008.   HPI: Patient is a 78 y/o female presents with confusion, amnesia and HA. Head CT- left occipital and left frontal parietial infarcts. MRI- multiple posterior/anterior bilateral infarcts. PMH includes multiple myeloma currently getting chemo, MI, COPD, hypotension, breast ca, fibromyalgia.  Subjective: pleasant Assessment / Plan / Recommendation CHL IP CLINICAL IMPRESSIONS 03/06/2018 Clinical Impression Pt demonstrated reduced lingual retraction which resulted in mild vallecular residue and transient penetration (PAS 2) with thin liquids when consecutive swallows were used. [name] independent use of dry secondary swallows was effective in reducing the residue and residue was eliminated with use of aliquid wash. Overall, her swallow mechanism appears to be within functional limits with consideration of her age and she does not appear to be at significant risk of aspiration at this time. It is recommended that a regular texture diet be continued but, with consideration of her reported xerostomia, liquid washes may be beneficial to improve bolus flow. SLP will  continue to follow for cognitive-linguisitic treatment and will see the pt once more for swallowing to ensure her observance of compensatory strategies. SLP Visit Diagnosis Dysphagia, unspecified (R13.10) Attention and concentration deficit following -- Frontal lobe and executive function deficit following -- Impact on safety and function No limitations   CHL IP TREATMENT RECOMMENDATION 03/06/2018 Treatment Recommendations Therapy as outlined in treatment plan below   Prognosis 03/06/2018 Prognosis for Safe Diet Advancement Good Barriers to Reach Goals Cognitive deficits Barriers/Prognosis Comment -- CHL IP DIET RECOMMENDATION 03/06/2018 SLP Diet Recommendations Regular solids;Thin liquid Liquid Administration via Cup;Straw Medication Administration Whole meds with liquid Compensations Small sips/bites;Slow rate;Follow solids with liquid Postural Changes Remain semi-upright after after feeds/meals (Comment);Seated upright at 90 degrees   No flowsheet data found.  CHL IP FOLLOW UP RECOMMENDATIONS 03/06/2018 Follow up Recommendations Inpatient Rehab   CHL IP FREQUENCY AND DURATION 03/06/2018 Speech Therapy Frequency (ACUTE ONLY) min 2x/week Treatment Duration 2 weeks      CHL IP ORAL PHASE 03/06/2018 Oral Phase WFL Oral - Pudding Teaspoon -- Oral - Pudding Cup -- Oral - Honey Teaspoon -- Oral - Honey Cup -- Oral - Nectar Teaspoon -- Oral - Nectar Cup -- Oral - Nectar Straw -- Oral - Thin Teaspoon -- Oral - Thin Cup -- Oral - Thin Straw -- Oral - Puree -- Oral - Mech Soft -- Oral - Regular -- Oral - Multi-Consistency -- Oral - Pill -- Oral Phase - Comment --  CHL IP PHARYNGEAL PHASE 03/06/2018 Pharyngeal Phase WFL Pharyngeal- Pudding Teaspoon -- Pharyngeal -- Pharyngeal- Pudding Cup -- Pharyngeal -- Pharyngeal- Honey Teaspoon -- Pharyngeal -- Pharyngeal- Honey Cup -- Pharyngeal -- Pharyngeal- Nectar Teaspoon -- Pharyngeal -- Pharyngeal- Nectar Cup -- Pharyngeal -- Pharyngeal- Nectar Straw -- Pharyngeal -- Pharyngeal- Thin  Teaspoon -- Pharyngeal -- Pharyngeal- Thin Cup Penetration/Aspiration during swallow Pharyngeal Material enters airway, remains ABOVE vocal cords then ejected out Pharyngeal- Thin Straw Penetration/Aspiration during  swallow Pharyngeal Material enters airway, remains ABOVE vocal cords then ejected out Pharyngeal- Puree Penetration/Aspiration before swallow;Penetration/Aspiration during swallow;Pharyngeal residue - valleculae Pharyngeal Material enters airway, remains ABOVE vocal cords then ejected out Pharyngeal- Mechanical Soft -- Pharyngeal -- Pharyngeal- Regular Pharyngeal residue - valleculae Pharyngeal -- Pharyngeal- Multi-consistency -- Pharyngeal -- Pharyngeal- Pill -- Pharyngeal -- Pharyngeal Comment --  Shanika I. Hardin Negus, [place], [place] Office number [phone] Pager [place] 03/06/2018, 9:42 AM              Mr Virgel Paling Wo Contrast  Result Date: 03/03/2018 CLINICAL DATA:  Stroke follow-up EXAM: MRA HEAD WITHOUT CONTRAST TECHNIQUE: Angiographic images of the Circle of Willis were obtained using MRA technique without intravenous contrast. COMPARISON:  Brain MRI 03/03/2018 FINDINGS: POSTERIOR CIRCULATION: --Basilar artery: Normal. --Posterior cerebral arteries: Normal. Both originate from the basilar artery. --Superior cerebellar arteries: Normal. --Inferior cerebellar arteries: Normal anterior and posterior inferior cerebellar arteries. ANTERIOR CIRCULATION: --Intracranial internal carotid arteries: Normal. --Anterior cerebral arteries: Normal. Both A1 segments are present. Patent anterior communicating artery. --Middle cerebral arteries: Normal. --Posterior communicating arteries: Absent bilaterally. IMPRESSION: Normal intracranial MRA. Electronically Signed   By: Ulyses Jarred M.D.   On: 03/03/2018 22:51   Ir Percutaneous Art Thrombectomy/infusion Intracranial Inc Diag Angio  Result Date: 03/14/2018 INDICATION: Acute onset of aphasia, and  right-sided weakness. 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 EXAM: 1. EMERGENT LARGE VESSEL OCCLUSION THROMBOLYSIS (anterior CIRCULATION) COMPARISON:  CT angiogram of the head and neck of 03/12/2018. MEDICATIONS: Ancef 2 g IV antibiotic was administered within 1 hour of the procedure. ANESTHESIA/SEDATION: General anesthesia. CONTRAST:  Isovue 300 approximately 60 cc. FLUOROSCOPY TIME:  Fluoroscopy Time: 52 minutes 40 seconds (1618 mGy). COMPLICATIONS: None immediate. TECHNIQUE: Following a full explanation of the procedure along with the potential associated complications, an informed witnessed consent was obtained from the patient's daughter. The risks of intracranial hemorrhage of 10%, worsening neurological deficit, ventilator dependency, death and inability to revascularize were all reviewed in detail with the patient's daughter. The patient was then put under general anesthesia by the Department of Anesthesiology at [facility]. The right groin was prepped and draped in the usual sterile fashion. Thereafter using modified Seldinger technique, transfemoral access into the right common femoral artery was obtained without difficulty. Over a 0.035 inch guidewire a 5 French Pinnacle sheath was inserted. Through this, and also over a 0.035 inch guidewire a 5 [place] JB 1 catheter was advanced to the aortic arch region and selectively positioned in the left common carotid artery. FINDINGS: The left common carotid arteriogram demonstrates the left external carotid artery at its origin to be mildly narrowed. Its branches are normal in caliber. The left internal carotid artery at the bulb is patent. There is a moderate to severe tortuosity of the mid cervical segment of the left internal carotid artery without evidence of kinking. The petrous, cavernous and supraclinoid segments are widely patent. There is a mild broad-based prominence of the inferior aspect of the  proximal cavernous segment. The left anterior cerebral artery opacifies into the capillary and venous phases. There is transient cross-filling via the anterior communicating artery of the right anterior cerebral artery A2 segment and distally. The left middle cerebral artery demonstrates complete angiographic occlusion of the left A1 segment distal to the origin of the anterior temporal branch. The lateral delayed arterial and capillary phase demonstrates retrograde reconstitution of the perisylvian branches from collaterals arising from the  pericallosal and callosal marginal branches. Also demonstrated is early opacification in the mid superior sagittal sinus region on the left from a middle meningeal posterior parietal branch. 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 PROCEDURE: ENDOVASCULAR COMPLETE REVASCULARIZATION OF OCCLUDED LEFT MIDDLE CEREBRAL ARTERY M1 SEGMENT WITH 2 PASSES WITH THE 5 MM X 33 MM EMBOTRAP RETRIEVAL DEVICE ACHIEVING A TICI 3 REVASCULARIZATION. The diagnostic JB 1 catheter in the left common carotid artery was then exchanged over a 0.035 inch 300 cm Rosen exchange guidewire for a 5 cm 8 [place] Neuron Max guide sheath. Good aspiration obtained from the hub of the Neuron Max sheath. A gentle control arteriogram demonstrated no evidence of intraluminal filling defects or dissections. The Neuron Max sheath was advanced to the hub with the tip protruding into the bulb of the left internal carotid artery. Through this, in a coaxial manner and with constant heparinized saline infusion, a Jet D 135 mm intermediary catheter inside of which was a velocity 150 cm microcatheter combination was advanced over a 0.014 inch Softip Synchro micro guidewire to the distal end of the Neuron Max sheath. Using biplane roadmap technique and constant fluoroscopic guidance, the combination was navigated through the moderate to moderately severe tortuosity of the cervical left ICA into the supraclinoid  segment. The micro guidewire was then gently advanced into the occluded left middle cerebral artery dominant inferior division M2 M3 region followed by the microcatheter. Good aspiration was obtained from the hub of the microcatheter. A gentle control arteriogram performed through the hub of the microcatheter demonstrated the tip to be in safe position. This was then connected to continuous heparinized saline infusion. The Jet D catheter could only be advanced to the distal cavernous segment. The Embotrap retrieval device was then deployed in the defined positions from distal to proximal by unsheathing the microcatheter. A control arteriogram performed through the Jet D intermediary catheter in the left internal carotid artery demonstrated a TICI 2b revascularization. Thereafter, with constant aspiration being applied using the Penumbra aspiration device, the combination of the retrieval device the microcatheter and the Jet D intermediary catheter was retrieved and removed as aspiration was continued. Free aspiration of blood was noted at the hub of the Neuron Max sheath. A gentle control arteriogram performed through the sheath demonstrated continued occlusion of the left middle cerebral artery. Following this, a combination of a 95 cm FlowGate balloon guide catheter which had been prepped with 50% contrast and 50% heparinized saline infusion was advanced and positioned in the proximal 1/3 of the left cervical ICA. Free aspiration was noted from the hub of the [facility] guide catheter. A control arteriogram performed through this in the proximal left internal carotid artery demonstrated no change in the extracranial or intracranial circulation. A combination of a 6 French 132 cm Catalyst guide catheter inside of which was an [employer] microcatheter was advanced over a 0.014 inch Softip Synchro micro guidewire to the supraclinoid left ICA. The micro guidewire was then advanced into the dominant M2 M3 region of  the inferior division of the left middle cerebral artery. This was then followed by the microcatheter. The guidewire was removed. After having verified safe position of tip of the microcatheter, the 5 mm x 33 mm Embotrap retrieval device was then deployed in the manner as described above. The 132 cm 6 [place] Catalyst guide catheter was advanced and engaged into the proximal aspect of the clot itself. Thereafter as constant aspiration was applied with a [place]  aspiration device at the hub of the 6 [place] Catalyst guide catheter, proximal flow arrest was established by inflating the balloon of the [facility] guide catheter in the left internal carotid artery. As constant aspiration was applied at the hub of the of FlowGate guide catheter with a 60 mL syringe, and the Penumbra suction device at the hub of the Catalyst guide catheter the combination of the retrieval device, the microcatheter and the Catalyst guide catheter were retrieved and removed as constant aspiration was continued at the hub of the [facility] guide catheter with a 60 mL syringe. Following deflation, free aspiration was obtained as well as free back bleed at the hub of the [facility] guide catheter. A control arteriogram performed through the [facility] guide catheter in the proximal left internal carotid artery demonstrated complete revascularization of the previously occluded left middle cerebral artery. Patency of the anterior cerebral artery circulation involving the anterior cerebral artery and the middle cerebral artery was established. Throughout the procedure, the patient's blood pressure and neurological status remained stable. No angiographic evidence of extravasation or of mass-effect was noted. The [facility] guide catheter and the neurovascular sheath were then retrieved into the abdominal aorta and exchanged over a J-tip guidewire for an 8 French Pinnacle sheath. This in turn was removed with the successful application of a 7 [place] ExoSeal  closure device with hemostasis in the right groin puncture site. The right groin appeared soft without evidence of a hematoma or bleeding. Distal pulses remained Dopplerable in the dorsalis pedis, and posterior regions bilaterally. An immediate post treatment CT of the brain revealed no evidence of a hemorrhage, or mass effect or midline shift. There appeared to be contrast stasis in the caudate head of the left basal ganglia, and also the anterior aspect of the putamen. The patient's general anesthesia was then reversed. The patient was extubated. Upon recovery the patient was able to breathe on her own and move all 4 limbs spontaneously and to command. She was then transferred to the neuro ICU to continue post thrombectomy management. IMPRESSION: Status post endovascular complete revascularization of occluded left middle cerebral artery M1 segment with 2 passes of the 5 mm x 33 mm Embotrap retrieval device achieving a TICI 3 revascularization. PLAN: Follow-up in the out patient clinic 4 weeks post discharge. Electronically Signed   By: Luanne Bras M.D.   On: 03/13/2018 15:13   Ct Head Code Stroke Wo Contrast`  Result Date: 03/12/2018 CLINICAL DATA:  Code stroke.  Slurred speech. EXAM: CT HEAD WITHOUT CONTRAST TECHNIQUE: Contiguous axial images were obtained from the base of the skull through the vertex without intravenous contrast. COMPARISON:  Head CT 03/05/2018 and MRI 03/03/2018 FINDINGS: Brain: Evolving subacute infarcts are again seen, small to moderate in size in the left parietal lobe and small in the left occipital lobe without evidence of interval infarct extension. Small right parietal and cerebellar infarcts on MRI are not well demonstrated. A small chronic left cerebellar infarct is again noted. No new infarct, intracranial hemorrhage, mass, midline shift, or extra-axial fluid collection is identified. The ventricles and sulci are within normal limits for age. There is mild chronic small  vessel ischemic disease affecting the cerebral white matter bilaterally. Vascular: Calcified atherosclerosis at the skull base. No hyperdense vessel. Skull: No fracture or focal osseous lesion. Sinuses/Orbits: Visualized paranasal sinuses and mastoid air cells are clear. Bilateral cataract extraction is noted. Other: None. ASPECTS [facility] Stroke Program Early CT Score) Not scored given recent evolving infarcts. IMPRESSION: Evolving subacute infarcts  as above without evidence of new intracranial abnormality. These results were communicated to Dr. Erlinda Hong at 2:53 pm on 03/12/2018 by text page via the [facility] messaging system. Electronically Signed   By: Logan Bores M.D.   On: 03/12/2018 14:53   Vas US Carotid (at [place] Only)  Result Date: 03/06/2018 Carotid Arterial Duplex Study Indications: CVA. Performing Technologist: Oliver Hum RVT  Examination Guidelines: A complete evaluation includes B-mode imaging, spectral Doppler, color Doppler, and power Doppler as needed of all accessible portions of each vessel. Bilateral testing is considered an integral part of a complete examination. Limited examinations for reoccurring indications may be performed as noted.  Right Carotid Findings: +----------+--------+-------+--------+----------------------+------------------+           PSV cm/sEDV    StenosisDescribe              Comments                             cm/s                                                    +----------+--------+-------+--------+----------------------+------------------+ CCA Prox  68      18                                   intimal thickening +----------+--------+-------+--------+----------------------+------------------+ CCA Distal77      20                                   intimal thickening +----------+--------+-------+--------+----------------------+------------------+ ICA Prox  72      21             smooth and                                                                 heterogenous                             +----------+--------+-------+--------+----------------------+------------------+ ICA Distal70      27                                                      +----------+--------+-------+--------+----------------------+------------------+ ECA       63      13                                                      +----------+--------+-------+--------+----------------------+------------------+ +----------+--------+-------+--------+-------------------+           PSV cm/sEDV cmsDescribeArm Pressure (mmHG) +----------+--------+-------+--------+-------------------+ [id number]                                         +----------+--------+-------+--------+-------------------+ +---------+--------+--+--------+-+---------+  VertebralPSV cm/s31EDV cm/s9Antegrade +---------+--------+--+--------+-+---------+  Left Carotid Findings: +----------+--------+--------+--------+-----------------------+--------+           PSV cm/sEDV cm/sStenosisDescribe               Comments +----------+--------+--------+--------+-----------------------+--------+ CCA Prox  82      23              smooth and heterogenous         +----------+--------+--------+--------+-----------------------+--------+ CCA Distal64      19              smooth and heterogenous         +----------+--------+--------+--------+-----------------------+--------+ ICA Prox  66      20              smooth and heterogenous         +----------+--------+--------+--------+-----------------------+--------+ ICA Distal81      32                                              +----------+--------+--------+--------+-----------------------+--------+ ECA       70      17                                              +----------+--------+--------+--------+-----------------------+--------+ +----------+--------+--------+--------+-------------------+ SubclavianPSV  cm/sEDV cm/sDescribeArm Pressure (mmHG) +----------+--------+--------+--------+-------------------+           96                                          +----------+--------+--------+--------+-------------------+ +---------+--------+--+--------+--+---------+ VertebralPSV cm/s54EDV cm/s19Antegrade +---------+--------+--+--------+--+---------+  Summary: Right Carotid: Velocities in the right ICA are consistent with a 1-39% stenosis. Left Carotid: Velocities in the left ICA are consistent with a 1-39% stenosis. Vertebrals: Bilateral vertebral arteries demonstrate antegrade flow. *See table(s) above for measurements and observations.  Electronically signed by Antony Contras MD on 03/06/2018 at 11:57:28 AM.    Final    Vas [place] Lower Extremity Venous (dvt)  Result Date: 03/07/2018  Lower Venous Study Other Indications: Stroke with PFO. Performing Technologist: June Leap RDMS, RVT  Examination Guidelines: A complete evaluation includes B-mode imaging, spectral Doppler, color Doppler, and power Doppler as needed of all accessible portions of each vessel. Bilateral testing is considered an integral part of a complete examination. Limited examinations for reoccurring indications may be performed as noted.  Right Venous Findings: +---------+---------------+---------+-----------+----------+-------------------+          CompressibilityPhasicitySpontaneityPropertiesSummary             +---------+---------------+---------+-----------+----------+-------------------+ CFV      Full           Yes      Yes                                      +---------+---------------+---------+-----------+----------+-------------------+ SFJ      Full                                                             +---------+---------------+---------+-----------+----------+-------------------+  FV Prox  Full                                                              +---------+---------------+---------+-----------+----------+-------------------+ FV Mid   Full                                                             +---------+---------------+---------+-----------+----------+-------------------+ FV DistalFull                                                             +---------+---------------+---------+-----------+----------+-------------------+ PFV      Full                                                             +---------+---------------+---------+-----------+----------+-------------------+ POP      Full           Yes      Yes                                      +---------+---------------+---------+-----------+----------+-------------------+ PTV      Full                                         not well visualized +---------+---------------+---------+-----------+----------+-------------------+ PERO     Full                                         not well visualized +---------+---------------+---------+-----------+----------+-------------------+  Left Venous Findings: +---------+---------------+---------+-----------+----------+------------------+          CompressibilityPhasicitySpontaneityPropertiesSummary            +---------+---------------+---------+-----------+----------+------------------+ CFV      Full           Yes      Yes                                     +---------+---------------+---------+-----------+----------+------------------+ SFJ      Full                                                            +---------+---------------+---------+-----------+----------+------------------+ FV Prox  Full                                                            +---------+---------------+---------+-----------+----------+------------------+  FV Mid   Full                                                             +---------+---------------+---------+-----------+----------+------------------+ FV DistalFull                                                            +---------+---------------+---------+-----------+----------+------------------+ PFV      Full                                                            +---------+---------------+---------+-----------+----------+------------------+ POP      Full           Yes      Yes                                     +---------+---------------+---------+-----------+----------+------------------+ PTV      None                                         isolated proximal                                                        calf               +---------+---------------+---------+-----------+----------+------------------+ PERO     None                                         isolated proximal                                                        calf               +---------+---------------+---------+-----------+----------+------------------+    Summary: Right: There is no evidence of deep vein thrombosis in the lower extremity. No cystic structure found in the popliteal fossa. Left: Findings consistent with acute deep vein thrombosis involving the left posterior tibial vein, and left peroneal vein. No cystic structure found in the popliteal fossa.  *See table(s) above for measurements and observations. Electronically signed by Deitra Mayo MD on 03/07/2018 at 7:51:58 PM.    Final    Dg Esophagus W Single Cm (sol Or Thin Ba)  Result Date: 03/06/2018 CLINICAL DATA:  Dysphagia EXAM: ESOPHOGRAM/BARIUM SWALLOW TECHNIQUE: Single contrast examination was performed using thick  and thin barium FLUOROSCOPY TIME:  Fluoroscopy Time:  1 minutes, 48 seconds Radiation Exposure Index (if provided by the fluoroscopic device): 17.9 mGy Number of Acquired Spot Images: None COMPARISON:  CT chest from 01/20/2018 FINDINGS: The patient was  relatively frail, and I elected not to give gas crystals. With patient laying down, swallows were performed in the LPO position. Mild distal esophageal fold thickening. No obvious ulceration. Mildly dilated esophagus. Primary peristaltic waves in the esophagus were disrupted on 4/4 swallows in the mid esophagus level. Initial swallows demonstrate some lobularity and irregularity along the upper margin of the gastroesophageal junction for example on image 35/1. Tumor or ulceration not readily excluded given this appearance. In the LPO position, there is poor filling of the gastroesophageal junction, with only slow percolated shin of contrast from the dilated esophagus into the stomach. A 13 mm barium tablet passed without difficulty into the stomach. Limited assessment of the upper stomach demonstrates no appreciable gastric diverticulum along the right margin of the upper stomach, raising concern that the rounded density in this vicinity on the recent chest CT from 01/20/2018 measuring 3.6 by 2.6 cm on that exam probably represents gastrohepatic ligament pathologic adenopathy rather than a diverticulum. IMPRESSION: 1. Nonspecific esophageal dysmotility disorder with disruption of primary peristaltic waves in the mid esophagus. 2. Distal esophageal irregularity potentially with ulceration. Difficult to exclude tumor of the distal esophagus. Endoscopy is recommended. 3. 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 Electronically Signed   By: Van Clines M.D.   On: 03/06/2018 09:31     ASSESSMENT/PLAN:   This is a pleasant 78 year old female with:  1.  Multiple myeloma.  She has been on and off treatment for several years.  Most recently she was treated with  Carfilzomib, Cytoxan, and dexamethasone.  Treatment is currently on hold.  2.  Newly diagnosed gastric adenocarcinoma.  Has not yet received treatment for this.  He has had continued functional decline and may not be a candidate for further treatment.  Will discuss with Dr. Julien Nordmann.  Agree with palliative care consult.  3.  Stroke and recent diagnosis of acute DVT.  Status post IVC filter placement and currently on Eliquis.  Nursing has not reported any bleeding.  Has received intermittent blood transfusions when her hemoglobin is less than 8.0.  Hemoglobin is 10.9 today.  No transfusion is indicated today.   Attempted to call her daughter, [name], this afternoon to discuss recommendations but was unsuccessful in reaching her.  Dr. Julien Nordmann will be seeing this patient later this afternoon and will talk to the daughter if she is at the bedside.   LOS: 10 days   Mikey Bussing, DNP, AGPCNP-BC, AOCNP 03/25/18  ADDENDUM: Hematology/Oncology Attending: The patient is seen and examined today.  I agreed with the above plan. This is a very pleasant 78 years old white female with multiple medical problems including history of early stage breast cancer many years ago as well as diagnosis of multiple myeloma and has been on treatment for many years.  The patient was also recently diagnosed with recurrent stroke and deep venous thrombosis.  She was also recently diagnosed with gastric adenocarcinoma. Her performance status has declined significantly over the last few weeks. The patient is not a good candidate for any systemic  treatment at this point.  She is not a surgical candidate for resection of the gastric cancer. I discussed with the patient the option of palliative care and hospice referral.  I recommended for the patient to continue with the physical therapy for now and if there is any significant change in her performance status in the future I would be happy to reconsider the patient for treatment.   The patient agreed to the current plan.  I will try to reach out to her daughter [name] to confirm with her my discussion with her mom as well as my recommendation. Thank you for taking good care of Ms. Gunkel.  Please call if you have any questions. Disclaimer: This note was dictated with voice recognition software. Similar sounding words can inadvertently be transcribed and may be missed upon review. [name], MD

## 2018-03-25 NOTE — Progress Notes (Signed)
Occupational Therapy Note  Patient Details  Name: Nancy Norris MRN: [id number] Date of Birth: May 17, 1940  Today's Date: 03/25/2018 OT Missed Time: 81 Minutes Missed Time Reason: Patient unwilling/refused to participate without medical reason;Patient fatigue  Pt missed 30 mins scheduled OT treatment session due to fatigue.  Pt asleep in bed, easily aroused to name, however declines any activity.  Pt denies pain and says she is comfortable, requests to rest.  RN and MD aware of pt current status.  [name] 03/25/2018, 2:48 PM

## 2018-03-25 NOTE — Progress Notes (Signed)
The chaplain joined PMT NP-KM at Pt. follow up meeting with Pt.'s daughter-[name]. The chaplain recognized the Pt. connection with her faith community and the daughter's role as the medical decision maker.  The chaplain will continue to provide spiritual care as needed with the Pt. and the Pt. daughter.Marland Kitchen

## 2018-03-25 NOTE — Progress Notes (Addendum)
Daily Progress Note   Patient Name: Nancy Norris       Date: 03/25/2018 DOB: 06-08-1940  Age: 78 y.o. MRN#: [id number] Attending Physician: Charlett Blake, MD Primary Care Physician: [name], MD Admit Date: 03/15/2018  Reason for Consultation/Follow-up: Establishing goals of care  Subjective: Patient in bed, much more lethargic than yesterday. Opens her eyes to my light touch, smiles says hello, but quickly closes eyes again.  Daughter Nancy Norris at bedside. She's not been able to participate in PT/OT for several days. Has eaten 0% of meals over past several days. She is unable to fully participate in [place] discussion or make decisions. Met with Nancy Norris separately. Reviewed overall GOC and options of continued aggressive medical care vs transition to comfort. Nancy Norris would like to transition to comfort care and pursue residential Hospice placement. Her main [place] for patient is for patient to be comfortable, not in pain, no suffering.  Discussed that transition to comfort includes stopping labs, IV fluids, blood transfusions, giving medications intended only for comfort and support through natural dying process.   Review of Systems  Unable to perform ROS: Acuity of condition    Length of Stay: 10  Current Medications: Scheduled Meds:  . apixaban  5 mg Oral BID  . atorvastatin  20 mg Oral q1800  . buPROPion  75 mg Oral BID  . citalopram  40 mg Oral Daily  . feeding supplement (ENSURE ENLIVE)  237 mL Oral BID BM  . folic acid  1 mg Oral Daily  . levothyroxine  150 mcg Oral QAC breakfast  . midodrine  10 mg Oral Daily  . pantoprazole  40 mg Oral Daily  . polyethylene glycol  17 g Oral BID  . potassium chloride  20 mEq Oral BID  . senna-docusate  2 tablet Oral QHS  . topiramate  25 mg  Oral QHS  . vitamin B-12  1,000 mcg Oral Daily    Continuous Infusions: . sodium chloride Stopped (03/25/18 0727)    PRN Meds: acetaminophen, albuterol, alum & mag hydroxide-simeth, bisacodyl, cromolyn, ipratropium-albuterol, ondansetron (ZOFRAN) IV, sodium chloride flush, traMADol  Physical Exam Vitals signs and nursing note reviewed.  Constitutional:      Appearance: She is ill-appearing.     Comments: frail  Skin:    Coloration: Skin is pale.  Neurological:  Comments: lethargic             Vital Signs: BP (!) 148/87 (BP Location: Right Arm)   Pulse 78   Temp 98.7 F (37.1 C) (Oral)   Resp 18   Ht _0  (1.651 m)   Wt 75 kg   SpO2 98%   BMI 27.51 kg/m  SpO2: SpO2: 98 % O2 Device: O2 Device: Room Air O2 Flow Rate:    Intake/output summary:   Intake/Output Summary (Last 24 hours) at 03/25/2018 1548 Last data filed at 03/25/2018 1430 Gross per 24 hour  Intake 891 ml  Output -  Net 891 ml   LBM: Last BM Date: 03/24/18 Baseline Weight: Weight: 79.2 kg Most recent weight: Weight: 75 kg       Palliative Assessment/Data: PPS: 20%    Flowsheet Rows     Most Recent Value  Intake Tab  Referral Department  -- [facility]]  Unit at Time of Referral  Other (Comment) [Rehab]  Palliative Care Primary Diagnosis  Neurology  Date Notified  03/24/18  Palliative Care Type  New Palliative care  Reason for referral  Clarify Goals of Care  Date of Admission  03/15/18  Date first seen by Palliative Care  03/24/18  # of days Palliative referral response time  0 Day(s)  # of days IP prior to Palliative referral  9  Clinical Assessment  Psychosocial & Spiritual Assessment  Palliative Care Outcomes      Patient Active Problem List   Diagnosis Date Noted  . Advanced care planning/counseling discussion   . Palliative care by specialist   . Goals of care, counseling/discussion   . Acute on chronic anemia   . Chronic kidney disease (CKD),  stage IV (severe) ([place])   . Orthostasis   . Weakness   . Hypokalemia   . Slow transit constipation   . Adenocarcinoma ([place])   . Nausea and vomiting   . 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 Hypercoagulable state, secondary ([place]) 03/14/2018  . Middle cerebral artery embolism, left 03/12/2018  . Subcortical infarction ([place]) 03/11/2018  . Pancytopenia ([place])   . NSTEMI (non-ST elevated myocardial infarction) ([place])   . Dyslipidemia   . Hypotension   . Acute DVT (deep venous thrombosis) ([place])   . Malignant neoplasm of stomach ([place])   . Acute cardioembolic stroke ([place])   . CKD (chronic kidney disease), stage III ([place])   . Dysphagia   . CKD (chronic kidney disease), stage IV ([place])   . Stroke ([place]) 03/03/2018  . Port catheter in place 09/18/2016  . 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 Symptomatic anemia 03/31/2015  . B12 deficiency 12/04/2014  . Thrombocytopenia ([place]) 12/04/2014  . Encounter for antineoplastic chemotherapy 06/21/2014  . CKD (chronic kidney disease) stage 5, GFR less than 15 ml/min (HCC) 04/02/2014  . Multiple myeloma ([place]) 12/02/2013  . Major depressive disorder, recurrent episode ([place]) 11/02/2013  . Medication management 09/30/2013  . Vitamin D deficiency 03/24/2013  . Mixed hyperlipidemia 02/24/2013  . COPD (chronic obstructive pulmonary disease) with emphysema ([place]) 02/20/2013  . GERD (gastroesophageal reflux disease) 01/20/2013  . HTN (hypertension) 01/14/2013  . Asthma, chronic 01/13/2013  . Hypothyroid 01/13/2013  . Chronic diastolic heart failure ([place]) 01/13/2013  . HX: breast cancer 12/08/2012  . Hypercalcemia 12/02/2012    Palliative Care Assessment & Plan   Patient Profile: 78 y.o. female  with past medical history of multiple myeloma not in remission was under treatment with kyprolis  and cytoxan prior to this admission, remote history of breast cancer, COPD,  Fibromyalgia, CKD IV,  admitted on 03/15/2018 with CVA. She was  initially [facility] to [facility]/17 with CVA and discharged to CIR, then experienced repeat CVA. Further workup has revealed fungating bleeding gastric mass. Course complicated due to malignancy increasing coagulopathy, anticoagulation increases bleeding which is complicated by multiple myeloma for which treatment is currently on hold- patient requiring blood transfusions to maintain Hgb. Patient with poor functional status for treatment of malignancy, and high risk for recurrently stroke without anticoagulation. Palliative medicine consulted for [place].   Assessment/Recommendations/Plan  - Patient is dying -Transition to full comfort care -D/C IV fluids -Will order comfort medications:   .5 hydromorphone IV q2hr prn SOB, air hunger  Lorazepam 544m po, SL or IV q4 hr anxiety  Haldol .576mpo, SL or IV q4 hr PRN agitation  Glycopyrrolate .44m13mV q4hr secretions -SW consult for referral to [facility] Additional Recommendations:  Limitations on Scope of Treatment: Full Comfort Care  Code Status:  DNR  Prognosis:   < 2 weeks d/t multiple embolic CVA's with continuing failure to thrive, gastric cancer with bleeding fungating mass, requiring blood transfusions, in the setting of active multiple myeloma, no po intake for several days, very poor functional status, now transitioning to full comfort care only  Discharge Planning:  Hospice facility  Care plan was discussed with patient's daughter- LisLattie Hawd PamReesa ChewA.[place] for allowing the Palliative Medicine Team to assist in the care of this patient.   Time In: 1500 Time Out: 1620 Total Time 80 minutes Prolonged Time Billed yes      Greater than 50%  of this time was spent counseling and coordinating care related to the above assessment and plan.  KasMariana KaufmanGNP-C Palliative Medicine   Please contact Palliative Medicine Team phone at [phone] questions and concerns.

## 2018-03-25 NOTE — Progress Notes (Signed)
Speech Language Pathology Daily Session Note  Patient Details  Name: Nancy Norris MRN: [id number] Date of Birth: Oct 10, 1940  Today's Date: 03/25/2018 SLP Individual Time: 3244-0102 SLP Individual Time Calculation (min): 8 min  Short Term Goals: Week 1: SLP Short Term Goal 1 (Week 1): Pt will complete functional basic problem solving tasks with Min A. SLP Short Term Goal 2 (Week 1): Pt will utilize word finding strategies to produce sentence length utterances with Mod A cues in 8 out of 10 opportunities.  SLP Short Term Goal 3 (Week 1): Pt will demonstrate selective attention for ~ 30 minutes in mildy distracting environment with Min A cues.   Skilled Therapeutic Interventions:Skilled ST services focused on cognitive skills and provided comfort. Pt missed 22 minutes of treatment due to request and fatigue. Pt was asleep with mouth open upon entering, however easily arouse to the sound of her name. SLP facilitated expressing wants/needs given questions prompts, pt initial denied wanting water, however holding straw near face pt drank 6 sips in a row. Pt requested to rest. SLP assisted in repositioning of pt and provided emotional support, praying with pt. Pt was left in room with call bell within reach and bed alarm set. SLP reccomends to continue skilled services.      Pain    Therapy/Group: Individual Therapy  Cavion Faiola  [facility] 03/25/2018, 9:03 AM

## 2018-03-25 NOTE — Progress Notes (Signed)
Physical Therapy Note  Patient Details  Name: Nancy Norris MRN: [id number] Date of Birth: 1940/05/06 Today's Date: 03/25/2018    Pt received in bed but barely opening eyes when therapist spoke to her. Pt requesting to rest. Pt missed 30 minutes of skilled PT treatment. Will f/u per POC.   [name] 03/25/2018, 10:50 AM

## 2018-03-25 NOTE — Progress Notes (Signed)
[facility] PHYSICAL MEDICINE & REHABILITATION PROGRESS NOTE   Subjective/Complaints: Pt in bed. Very fatigued. Hasn't been able participate in PT/OT. Denies pain and says she's comfortable.   ROS: Limited due to cognitive/behavioral   Objective:   No results found. Recent Labs    03/24/18 0417 03/25/18 0401  WBC 5.5 5.8  HGB 7.6* 10.9*  HCT 24.2* 33.3*  PLT 55* 48*   Recent Labs    03/23/18 0334 03/24/18 0417  NA 135 138  K 3.1* 3.7  CL 105 112*  CO2 20* 18*  GLUCOSE 99 102*  BUN 28* 24*  CREATININE 2.49* 2.36*  CALCIUM 8.1* 8.2*    Intake/Output Summary (Last 24 hours) at 03/25/2018 1339 Last data filed at 03/24/2018 2102 Gross per 24 hour  Intake 961 ml  Output -  Net 961 ml     Physical Exam: Vital Signs Blood pressure 122/60, pulse 68, temperature 98 F (36.7 C), temperature source Oral, resp. rate 17, height _0  (1.651 m), weight 75 kg, SpO2 98 %.   Constitutional: No distress . Vital signs reviewed. Fatigued appearing HEENT: EOMI, oral membranes moist Neck: supple Cardiovascular: RRR without murmur. No JVD    Respiratory: CTA Bilaterally without wheezes or rales. Normal effort    GI: BS +, non-tender, non-distended  Musc: No edema or tenderness in extremities. Skin: No evidence of breakdown, no evidence of rash Neurologic: Alert, word finding deficits Motor:3+ to 4-/5 RUE/RLE  4/5 LUE/LLE.  Psych: pleasant, flat  Assessment/Plan: 1. Functional deficits secondary to Left MCA infarct which require 3+ hours per day of interdisciplinary therapy in a comprehensive inpatient rehab setting.  Physiatrist is providing close team supervision and 24 hour management of active medical problems listed below.  Physiatrist and rehab team continue to assess barriers to discharge/monitor patient progress toward functional and medical goals  Care Tool:  Bathing    Body parts bathed by patient: Right arm, Left arm, Chest, Abdomen, Front perineal area,  Buttocks, Right upper leg, Left upper leg, Face, Right lower leg, Left lower leg         Bathing assist Assist Level: Minimal Assistance - Patient > 75%(for standing balance)     Upper Body Dressing/Undressing Upper body dressing   What is the patient wearing?: Pull over shirt    Upper body assist Assist Level: Minimal Assistance - Patient > 75%    Lower Body Dressing/Undressing Lower body dressing      What is the patient wearing?: Underwear/pull up, Pants     Lower body assist Assist for lower body dressing: Minimal Assistance - Patient > 75%     Toileting Toileting Toileting Activity did not occur (Clothing management and hygiene only): N/A (no void or bm)  Toileting assist Assist for toileting: Minimal Assistance - Patient > 75% Assistive Device Comment: (Front wheel walker)   Transfers Chair/bed transfer  Transfers assist     Chair/bed transfer assist level: 2 Helpers     Locomotion Ambulation   Ambulation assist      Assist level: Minimal Assistance - Patient > 75% Assistive device: Walker-rolling Max distance: 180   Walk 10 feet activity   Assist  Walk 10 feet activity did not occur: Refused(due to pain)  Assist level: Minimal Assistance - Patient > 75% Assistive device: Walker-rolling   Walk 50 feet activity   Assist Walk 50 feet with 2 turns activity did not occur: Refused(due to pain)  Assist level: Minimal Assistance - Patient > 75% Assistive device: Walker-rolling  Walk 150 feet activity   Assist Walk 150 feet activity did not occur: Refused(due to pain)  Assist level: Minimal Assistance - Patient > 75% Assistive device: Walker-rolling    Walk 10 feet on uneven surface  activity   Assist Walk 10 feet on uneven surfaces activity did not occur: Refused         Wheelchair     Assist Will patient use wheelchair at discharge?: No      Wheelchair assist level: Dependent - Patient 0%      Wheelchair 50 feet  with 2 turns activity    Assist    Wheelchair 50 feet with 2 turns activity did not occur: N/A       Wheelchair 150 feet activity     Assist Wheelchair 150 feet activity did not occur: N/A        Medical Problem List and Plan: 1.Confusion/altered mental status with gait disorder and speech difficultysecondary to left MCA infarction due to left M2 high-grade stenosis status post revascularizationembolic pattern likely secondary to hypercoagulable state  Repeat MRI showing multiple embolic strokes, neurology note reviewed  Appreciate input and assistance of palliative care team. Family and patient deciding upon next steps including potential hospice care. To meet today  -Dr. Julien Nordmann aware but sick at this time. Hasn't yet seen patient.   -therapy/activity as tolerated, but pt very limited right now.   2. Antithrombotics: -DVT/anticoagulation:SCDs.+ DOAC -antiplatelet therapy: ELIQUIS 3. Pain Management:Tylenol as needed  Epigastric pain likely due to gastric adeno carcinoma relieved with GI cocktail  -headache improved with topamax  -continue tramadol for more severe pain 4. Mood:Wellbutrin 75 mg twice a day, Celexa 40 mg daily. Provide emotional support -antipsychotic agents: None  States she took something at home for anxiety, already on Wellbutrin and Celexa  5. Neuropsych: This patientiscapable of making decisions on herown behalf. 6. Skin/Wound Care:Routine skin checks 7. Fluids/Electrolytes/Nutrition:Monitor I/Os  Encourage PO intake as possible  8. Poorly differentiated adenocarcinoma the stomach as well as history of multiple myeloma and remote breast cancer. Patient  to follow up outpatient with oncology services Dr. Earlie Server 9. COPD with remote tobacco use. Continue nebulizer treatments as directed 10. Chronic anemia/thrombocytopenia. Patient follow-up outpatient with hematology oncology. Transfuse as needed  Platelets  58 on 3/8---> 55 3/9--->48 today 3/10  Hgb 7.6 before blood transfusion 2u prbc. 10.9 today 3/10 11. Orthostatic hypotension. ProAmatine 10 mg daily  Continue to monitor, continue IVF for now 12. CKD stage IV. Creatinine baseline 2.29-2.93.   Creatinine 2.49 on 3/8---->2.39 3/9 13. Hypothyroidism. Synthroid 14. GERD. Protonix 15. Hyperlipidemia. Lipitor  16.  Constipation:  Meds increased on 3/8 17.  HypoK:  KCL 22mq BID ordered on 3/8  Potassium 3.1 on 3/8, back to 3.7 3/9   LOS: 10 days A FACE TO FFort Davis3/10/2018, 1:39 PM   Thirty -five minutes of face to face patient care time were spent during this visit. All questions were encouraged and answered.

## 2018-03-25 NOTE — Progress Notes (Signed)
Chaplain responded to spiritual consult. Chaplain sees from notes that another chaplain visited patient early morning.   Patient asleep when chaplain came to room.  Daughter [name] was not present. Will refer this patient to palliative care chaplain coming in later today. Tamsen Snider Pager [phone]
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
## 2018-03-26 ENCOUNTER — Ambulatory Visit: Payer: Medicare Other

## 2018-03-26 ENCOUNTER — Inpatient Hospital Stay (HOSPITAL_COMMUNITY): Payer: Medicare Other | Admitting: Physical Therapy

## 2018-03-26 ENCOUNTER — Inpatient Hospital Stay (HOSPITAL_COMMUNITY): Payer: Medicare Other

## 2018-03-26 ENCOUNTER — Other Ambulatory Visit: Payer: Medicare Other

## 2018-03-26 ENCOUNTER — Inpatient Hospital Stay (HOSPITAL_COMMUNITY): Payer: Medicare Other | Admitting: Occupational Therapy

## 2018-03-26 DIAGNOSIS — Z515 Encounter for palliative care: Secondary | ICD-10-CM

## 2018-03-26 MED ORDER — TOPIRAMATE 25 MG PO TABS: 25.0000 mg | ORAL_TABLET | Freq: Every day | ORAL | Status: AC

## 2018-03-26 MED ORDER — ACETAMINOPHEN 325 MG PO TABS: 650.0000 mg | ORAL_TABLET | ORAL | Status: AC | PRN

## 2018-03-26 NOTE — Progress Notes (Addendum)
Speech Language Pathology Weekly Progress and Session Note  Patient Details  Name: Nancy Norris MRN: [id number] Date of Birth: 1940/05/11  Beginning of progress report period: March 17, 2018 End of progress report period: March 26, 2018  Today's Date: 03/26/2018 SLP Individual Time: 6378-5885 SLP Individual Time Calculation (min): 6 min  Short Term Goals: Week 1: SLP Short Term Goal 1 (Week 1): Pt will complete functional basic problem solving tasks with Min A. SLP Short Term Goal 1 - Progress (Week 1): Not met SLP Short Term Goal 2 (Week 1): Pt will utilize word finding strategies to produce sentence length utterances with Mod A cues in 8 out of 10 opportunities.  SLP Short Term Goal 2 - Progress (Week 1): Not met SLP Short Term Goal 3 (Week 1): Pt will demonstrate selective attention for ~ 30 minutes in mildy distracting environment with Min A cues.  SLP Short Term Goal 3 - Progress (Week 1): Not met    New Short Term Goals: Week 2: SLP Short Term Goal 1 (Week 2): Pt will complete functional basic problem solving tasks with Max A. SLP Short Term Goal 2 (Week 2): Pt will express wants/needs in response to yes/no questions with Mod A mulitmodal cues.  Weekly Progress Updates:Pt made no progress meeting 0 out 3 goals, due to medical changes. Pt presented with several acute infracts, noted on 3/6 which resulted in cognitive and communicative changes. Pt and pt's family have request to pursue comfort care at this time. SLP will focus time within inpatient rehabilitation of expressing wants/needs given yes/no questions and basic problem solving skills (accessing call bell..etc.). Pt has requested to not participate, request to rest, in the past two sessions. SLP has downgrade and adjusted LTG to aline with new plan of care.     Intensity: Minumum of 1-2 x/day, 30 to 90 minutes Frequency: (QD) Duration/Length of Stay: TBD comfortcare  Treatment/Interventions: Cognitive  remediation/compensation;Internal/external aids;Functional tasks;Cueing hierarchy;Patient/family education;Other (comment)   Daily Session  Skilled Therapeutic Interventions: Skilled ST services focused on education. Pt was laying in bed upon entering room. SLP requested participation in basic communication and cognitive tasks, however pt declines. SLP facilitated education of how to utilizie call bell to request assistance and downgrade pt to full supervision/assist during meals due to ability to access items on tray/self-feed. Pt stated understanding/ Pt was left in room with call bell within reach and bed alarm set. SLP reccomends to continue skilled services.    General    Pain Pain Assessment Pain Scale: 0-10 Faces Pain Scale: No hurt  Therapy/Group: Individual Therapy  Ulyses Panico  [facility] 03/26/2018, 9:34 AM

## 2018-03-26 NOTE — Progress Notes (Signed)
Social Work  Discharge Note  The overall goal for the admission was met for:   Discharge location: Yes-[facility]  Length of Stay: No-16 DAYS  Discharge activity level: No-MOD/MAX LEVEL  Home/community participation: Yes  Services provided included: MD, RD, PT, OT, SLP, RN, CM, Pharmacy, Neuropsych and SW  Financial Services: Medicare and Private Insurance: [employer] [place]  Follow-up services arranged: Other: [facility]  Comments (or additional information):[name] PROGNOSIS POOR AFTER HAVING MULTIPLE STROKES AND QUES GI BLEED-WITH CANCER PROGRESSING. COMFORT CARE DECIDED UPON WITH PT AND DAUGHTER VIA PALLIATIVE CARE. TRANSFER TO [facility] DUE TO TERMINAL DIAGNOSIS. DAUGHTER CONTACTED WHEN AMB HERE PLANS TO MEET AT [facility].  Patient/Family verbalized understanding of follow-up arrangements: Yes  Individual responsible for coordination of the follow-up plan: [name]  Confirmed correct DME delivered: [name] 03/26/2018    [name]

## 2018-03-26 NOTE — Progress Notes (Signed)
Occupational Therapy Discharge Summary  Patient Details  Name: Nancy Norris MRN: [id number] Date of Birth: 12-07-1940  Pt is discharging from skilled OT services as pt has experienced a medical decline and pt/family have decided to pursue Hospice care for comfort care. OT goals are no longer applicable as pt is not appropriate for OT treatment and will not be completing the CIR program.   Reasons for discharge: change in medical status and discharge from hospital to Hospice care  Patient/family agrees with progress made and goals achieved: Yes   [name] 03/26/2018, 3:58 PM

## 2018-03-26 NOTE — Telephone Encounter (Addendum)
I called Nancy Norris , she understands Nancy Norris may not see pt before she goes to [employer] today. I expressed our sympathy.

## 2018-03-26 NOTE — Progress Notes (Signed)
Palliative note:   Patient in be, awake. Denies pain, anxiety. Has declined all po intake today. Noted plans for transfer to [facility] today.  Mariana Kaufman, AGNP-C Palliative Medicine  Please call Palliative Medicine team phone with any questions [phone]. For individual providers please see AMION.  Time In: 1440 Time Out: 1505 Total Time: 25 mins  Greater than 50%  of this time was spent counseling and coordinating care related to the above assessment and plan

## 2018-03-26 NOTE — Progress Notes (Signed)
Physical Therapy Note  Patient Details  Name: Nancy Norris MRN: [id number] Date of Birth: Dec 02, 1940 Today's Date: 03/26/2018    Pt received in bed reporting "my back itches" with therapist providing lotion & rubbing pt's back with pt voicing relief. Therapist and nursing student repositioned pt in bed dependent assist. Offered pt sips of ensure, or to sit up and eat but pt declined and also declined any other activity. Pt left in bed with alarm set & nursing student present in room.  [name] 03/26/2018, 10:13 AM

## 2018-03-26 NOTE — Progress Notes (Signed)
[facility]  Received request from [employer] for family interest in [facility]. Chart reviewed and eligibility confirmed. Paper work completed by daughter [name]. Dr. Tomasa Hosteller to assume care. Discharge summary has been sent.   RN please call report to [phone].  Thank you,  Erling Conte, LCSW [phone]

## 2018-03-26 NOTE — Discharge Summary (Signed)
Physician Discharge Summary  Patient ID: Nancy Norris MRN: [id number] DOB/AGE: 03-12-1940 78 y.o.  Admit date: 03/15/2018 Discharge date: 03/26/2018  Discharge Diagnoses:  Active Problems:   Left middle cerebral artery stroke (HCC)   Weakness   Hypokalemia   Slow transit constipation   Adenocarcinoma (HCC)   Nausea and vomiting   Acute on chronic anemia   Chronic kidney disease (CKD), stage IV (severe) (HCC)   Orthostasis   Advanced care planning/counseling discussion   Palliative care by specialist   Goals of care, counseling/discussion   Discharged Condition:  guarded  Significant Diagnostic Studies: Ct Abdomen Pelvis Wo Contrast  Result Date: 03/22/2018 CLINICAL DATA:  Diffuse abdominal pain with nausea and vomiting. EXAM: CT ABDOMEN AND PELVIS WITHOUT CONTRAST TECHNIQUE: Multidetector CT imaging of the abdomen and pelvis was performed following the standard protocol without IV contrast. COMPARISON:  CT abdomen pelvis dated March 08, 2018. FINDINGS: Lower chest: No acute abnormality. Hepatobiliary: 2.7 cm hypodense lesion in the left hepatic lobe, slightly more conspicuous on today's study. Status post cholecystectomy. No biliary dilatation. Pancreas: Mild atrophy. No ductal dilatation or surrounding inflammatory changes. Spleen: Unchanged 2.5 cm hypodense lesion in the dome of the spleen. There is also likely a 2.2 cm hypodense lesion in the inferior spleen which is obscured by streak artifact on prior study. Adrenals/Urinary Tract: Unchanged 1.6 cm left adrenal nodule since September 2017, likely an adenoma. Right adrenal gland is unremarkable. Unchanged cortical scarring and atrophy of both kidneys. No focal renal lesion, calculi, or hydronephrosis. Stomach/Bowel: Unchanged small hiatal hernia. Unchanged wall thickening of the gastroesophageal junction and medial proximal stomach. No small bowel or colonic wall thickening, distention, or surrounding inflammatory changes. The  appendix is not visualized, but there are no signs of inflammation at the base of the cecum. Vascular/Lymphatic: Increased bulky lymphadenopathy in the gastrohepatic ligament, now measuring 5.4 x 3.3 cm, previously 4.5 x 3.2 cm. Other small nearby lymph nodes now measure 13-14 mm, previously 10-12 mm. Small portacaval lymph node has increased in size, now measuring 12 mm, previously 10 mm. Interval placement of an IVC filter with the tip just below the left renal vein. Aortoiliac atherosclerosis. Reproductive: Status post hysterectomy. No adnexal masses. Other: No abdominal wall hernia or abnormality. No abdominopelvic ascites. No pneumoperitoneum. Musculoskeletal: No acute or significant osseous findings. IMPRESSION: 1.  No acute intra-abdominal process. 2. Grossly unchanged wall thickening involving the gastroesophageal junction and medial proximal stomach, consistent with known gastric adenocarcinoma. Slightly increased bulky lymphadenopathy in the gastrohepatic ligament. 3. 2.7 cm slightly hypodense lesion in the left hepatic lobe. 2.5 and 2.2 cm hypodense lesions in the spleen. These are not well evaluated due to lack of intravenous contrast. Metastatic disease is not excluded. Electronically Signed   By: Titus Dubin M.D.   On: 03/22/2018 09:01   Ct Abdomen Pelvis Wo Contrast  Result Date: 03/08/2018 CLINICAL DATA:  Large ulcerated gastric fundal mass found on upper endoscopy today. EXAM: CT CHEST, ABDOMEN AND PELVIS WITHOUT CONTRAST TECHNIQUE: Multidetector CT imaging of the chest, abdomen and pelvis was performed following the standard protocol without IV contrast. COMPARISON:  CT chest 01/20/2018 FINDINGS: CT CHEST FINDINGS Cardiovascular: The heart size is normal. No substantial pericardial effusion. Coronary artery calcification is evident. Atherosclerotic calcification is noted in the wall of the thoracic aorta. Right Port-A-Cath tip is positioned in the distal SVC near the junction with the RA.  Mediastinum/Nodes: 12 mm short axis prevascular node seen on 17/3. Scattered small central mediastinal lymph nodes identified  in the upper mediastinum. 12 mm short axis retrotracheal node visible on 16/3. No subcarinal or lower paraesophageal lymphadenopathy. No evidence for gross hilar lymphadenopathy although assessment is limited by the lack of intravenous contrast on today's study. There is no axillary lymphadenopathy. Lungs/Pleura: The central tracheobronchial airways are patent. 19 mm ring-like lesion in the right upper lobe is stable and may represent airway impaction with associated tree-in-bud nodularity. 6 mm medial right upper lobe nodule visible on 67/4. 5 mm right middle lobe nodule visible on 102/4, stable. 1.9 x 1.2 cm irregular opacity in the posterior left upper lobe is at the site of the 4.2 x 3.8 cm mass seen on PET-CT of 10/11/2015 and may reflect residual scarring. Small left pleural effusion evident. Musculoskeletal: No worrisome lytic or sclerotic osseous abnormality. CT ABDOMEN PELVIS FINDINGS Hepatobiliary: No focal abnormality in the liver on this study without intravenous contrast. Gallbladder surgically absent. No intrahepatic or extrahepatic biliary dilation. Pancreas: No focal mass lesion. No dilatation of the main duct. No intraparenchymal cyst. No peripancreatic edema. Spleen: Subtle 2.7 x 2.0 cm hypoattenuating lesion is identified in the dome of spleen, not definitely seen on prior. Adrenals/Urinary Tract: Right adrenal gland unremarkable. 1.6 cm left adrenal nodule stable since PET-CT of 10/01/2015. Cortical scarring noted in both kidneys. No evidence for hydroureter. The urinary bladder appears normal for the degree of distention. Stomach/Bowel: Tiny hiatal hernia noted. Wall thickening is noted in the esophagogastric junction and proximal stomach. Wall thickening extends along the medial aspect of the proximal stomach. Distal stomach unremarkable. Duodenum is normally positioned  as is the ligament of Treitz. No small bowel wall thickening. No small bowel dilatation. The terminal ileum is normal. The appendix is not visualized, but there is no edema or inflammation in the region of the cecum. No gross colonic mass. No colonic wall thickening. Vascular/Lymphatic: There is abdominal aortic atherosclerosis without aneurysm. Bulky lymphadenopathy is seen in the hepato duodenal ligament. 3.2 x 4.5 cm lymph node seen on 55/3. The 2.9 x 2.8 cm lymph node seen on 57/3. 10 mm short axis portal caval lymph node (63/3) is suspicious. No para-aortic retroperitoneal lymphadenopathy. No pelvic sidewall lymphadenopathy. Reproductive: The uterus is surgically absent. There is no adnexal mass. Other: No intraperitoneal free fluid. Musculoskeletal: No worrisome lytic or sclerotic osseous abnormality. IMPRESSION: 1. Marked wall thickening in the esophagogastric junction and proximal stomach, consistent with known neoplasm. Bulky lymphadenopathy in the gastrohepatic ligament is compatible with metastatic disease. 10 mm short axis portal caval lymph node also suspicious. 2. 2.7 cm hypoattenuating lesion in the spleen. This is not well assessed on today's noncontrast exam. Close attention recommended as metastatic disease not excluded. 3. Scattered tiny pulmonary nodules measuring up to 5 mm. Nonspecific, but close attention recommended as metastatic disease not excluded. 4. Mild lymphadenopathy noted in the upper mediastinum. 5. Circular lesion right upper lobe on axial imaging has a branching configuration on coronal and sagittal images. This is probably airway impaction related to atypical infection. Close follow-up recommended. 6. 1.9 x 1.2 cm irregular lesion posterior left upper lobe at the site of a 4 cm mass on PET-CT of 10/11/2015. Imaging features today are compatible with residua of that lesion. 7. 1.6 cm left adrenal nodule stable since 10/11/2015, likely benign. 8.  Aortic Atherosclerois  (ICD10-170.0) Electronically Signed   By: Misty Stanley M.D.   On: 03/08/2018 19:19   Ct Angio Head W Or Wo Contrast  Result Date: 03/12/2018 CLINICAL DATA:  Stroke, follow-up. Recent acute nonhemorrhagic  infarcts involving the medial left occipital lobe and bilateral parietal lobes. Punctate acute infarct in the posterior left frontal lobe. EXAM: CT ANGIOGRAPHY HEAD AND NECK TECHNIQUE: Multidetector CT imaging of the head and neck was performed using the standard protocol during bolus administration of intravenous contrast. Multiplanar CT image reconstructions and MIPs were obtained to evaluate the vascular anatomy. Carotid stenosis measurements (when applicable) are obtained utilizing NASCET criteria, using the distal internal carotid diameter as the denominator. CONTRAST:  12m ISOVUE-370 IOPAMIDOL (ISOVUE-370) INJECTION 76% COMPARISON:  MRI of the brain and MRA circle-of-Willis 03/03/2018. CT head without contrast 03/12/2018. FINDINGS: CTA NECK FINDINGS Aortic arch: A 3 vessel arch configuration is present. Atherosclerotic changes are noted at the origin of the left subclavian artery without significant stenosis. Additional calcifications are present in the distal arch. There is no aneurysm. Right carotid system: The right common carotid artery is tortuous proximally. Minimal atherosclerotic changes are noted at the right carotid bifurcation. There is no significant stenosis. There is mild tortuosity of the cervical right ICA without significant stenosis. Left carotid system: The left common carotid artery is mildly tortuous without significant stenosis. The bifurcation is unremarkable. There is moderate tortuosity of the more distal cervical left ICA without a significant stenosis. Vertebral arteries: The left vertebral artery is slightly dominant to the right. Both vertebral arteries originate from the subclavian arteries without significant stenosis. There is no significant stenosis or vascular injury to  either vertebral artery in the neck. Skeleton: Levoconvex curvature is present in the lower cervical spine. Grade 1 degenerative anterolisthesis is present at C4-5. Uncovertebral disease is present bilaterally at C5-6 and C6-7. Other neck: The soft tissues of the neck demonstrate moderate atrophy of the thyroid. Salivary glands are within normal limits bilaterally. Mediastinal and thoracic inlet adenopathy again noted, recent described on CT of the chest. Upper chest: A ring-like lesion in the right upper lobe measures 18 mm, stable. No other focal nodules are visualized. Centrilobular emphysematous changes are present. Review of the MIP images confirms the above findings CTA HEAD FINDINGS Anterior circulation: Atherosclerotic calcifications are present within the cavernous internal carotid arteries bilaterally. There is no significant stenosis relative to the ICA termini. The A1 and M1 segments are normal. Anterior communicating artery is patent. MCA bifurcations are intact bilaterally. There is a high-grade stenosis or nonocclusive thrombus involving the superior left M2 division proximally. Posterior circulation: Left vertebral artery is dominant. PICA origins are visualized and normal. Both posterior cerebral arteries originate from basilar tip. A left posterior communicating artery contributes. PCA branch vessels are within normal limits bilaterally. Venous sinuses: Dural sinuses are patent. Straight sinus deep cerebral veins are intact. Cortical veins are within normal limits. Anatomic variants: None Review of the MIP images confirms the above findings IMPRESSION: 1. High-grade stenosis versus nonocclusive thrombus involving the proximal left superior M2 division. 2. No other focal stenosis or occlusion. 3. No aneurysm. 4. Tortuosity of the cervical vasculature without significant stenosis. 5. Aortic Atherosclerosis (ICD10-I70.0). No aneurysm or stenosis at the aortic arch. Electronically Signed   By:  CSan MorelleM.D.   On: 03/12/2018 15:37   Dg Chest 2 View  Result Date: 03/20/2018 CLINICAL DATA:  LEFT-sided weakness. EXAM: CHEST - 2 VIEW COMPARISON:  Chest x-ray dated 03/03/2018. FINDINGS: Heart size and mediastinal contours are stable. RIGHT chest wall Port-A-Cath is stable in position with tip at the level of the lower SVC/cavoatrial junction. Mild scarring/atelectasis again noted within the LEFT mid lung. Lungs are otherwise clear. No pleural effusion  or pneumothorax seen. No acute or suspicious osseous finding. IMPRESSION: No active cardiopulmonary disease. No evidence of pneumonia or pulmonary edema. Electronically Signed   By: Franki Cabot M.D.   On: 03/20/2018 19:52   Dg Chest 2 View  Result Date: 03/03/2018 CLINICAL DATA:  Weakness, dizziness and confusion over the last 2 weeks. History of renal cell cancer and myeloma. EXAM: CHEST - 2 VIEW COMPARISON:  02/26/2018 radiography.  CT 01/20/2018. FINDINGS: Heart size is normal. Chronic aortic atherosclerosis. Power port inserted from a right jugular approach has its tip at the SVC RA junction. The lungs are clear except for mild scarring. No sign of active infiltrate, mass, effusion or collapse. IMPRESSION: No active cardiopulmonary disease. Electronically Signed   By: Nelson Chimes M.D.   On: 03/03/2018 12:58   Ct Head Wo Contrast  Result Date: 03/20/2018 CLINICAL DATA:  78 y/o F; Headache, acute, severe, worst HA of life. EXAM: CT HEAD WITHOUT CONTRAST TECHNIQUE: Contiguous axial images were obtained from the base of the skull through the vertex without intravenous contrast. COMPARISON:  03/13/2018 MRI of the head. 03/12/2018 CTA of the head. FINDINGS: Brain: Infarcts within the left parietal lobe and basal ganglia as well as very small infarcts in the right parietal lobe stable in distribution from prior MRI with interval decrease in local edema and mass effect. Small chronic infarcts are present in the left occipital lobe and right  superior cerebellum. Stable background of moderate chronic microvascular ischemic changes and volume loss of brain. No new acute stroke, hemorrhage, extra-axial collection, hydrocephalus, or mass effect. Vascular: Calcific atherosclerosis of carotid siphons. No hyperdense vessel identified. Skull: Normal. Negative for fracture or focal lesion. Sinuses/Orbits: No acute finding. Other: None. IMPRESSION: 1. No acute intracranial abnormality identified. 2. Stable distribution of recent infarcts in the left-greater-than-right parietal lobe and left basal ganglia from prior MRI. Decreased edema and local mass effect. 3. Stable background of moderate chronic microvascular ischemic changes, parenchymal volume loss of the brain, and chronic infarctions. Electronically Signed   By: Kristine Garbe M.D.   On: 03/20/2018 19:09   Ct Head Wo Contrast  Result Date: 03/05/2018 CLINICAL DATA:  Headache beginning this afternoon. History of multiple myeloma, thrombocytopenia and stroke. EXAM: CT HEAD WITHOUT CONTRAST TECHNIQUE: Contiguous axial images were obtained from the base of the skull through the vertex without intravenous contrast. COMPARISON:  MRI of the head March 03, 2018 and CT HEAD March 03, 2018. FINDINGS: BRAIN: Patchy LEFT > RIGHT parietal and LEFT occipital hypodensities are increasingly conspicuous from prior CT. No propagation. No intraparenchymal hemorrhage, mass effect or midline shift. Patchy LEFT frontal white matter hypodensities most compatible with chronic small vessel ischemic changes. Old small LEFT cerebellar infarct. No parenchymal brain volume loss for age. No hydrocephalus. No abnormal extra-axial fluid collections. VASCULAR: Moderate calcific atherosclerosis of the carotid siphons. SKULL: No skull fracture. Moderate temporomandibular osteoarthrosis. No significant scalp soft tissue swelling. SINUSES/ORBITS: Trace paranasal sinus mucosal thickening. Mastoid air cells are well  aerated.The included ocular globes and orbital contents are non-suspicious. Status post bilateral ocular lens implants. OTHER: None. IMPRESSION: 1. Involving acute LEFT > RIGHT parietal and LEFT occipital lobe infarcts without hemorrhagic conversion. 2. Mild-to-moderate chronic small vessel ischemic changes. Old small LEFT cerebellar infarct. Electronically Signed   By: Elon Alas M.D.   On: 03/05/2018 17:25   Ct Head Wo Contrast  Result Date: 03/03/2018 CLINICAL DATA:  Confused. Altered level of consciousness. Breast cancer. EXAM: CT HEAD WITHOUT CONTRAST TECHNIQUE: Contiguous axial images  were obtained from the base of the skull through the vertex without intravenous contrast. COMPARISON:  02/26/2018. FINDINGS: Brain: Two separate areas of ill-defined abnormal cortical and subcortical edema in the LEFT hemisphere, affecting the LEFT parietal and LEFT occipital lobes, could represent multifocal infarcts versus rapid progression of metastatic disease. These areas were previously unremarkable on CT 5 days earlier. MRI of the brain without and with contrast recommended for further evaluation. Elsewhere, mild to moderate atrophy. Hypoattenuation of white matter, consistent with small vessel disease/post treatment effect. Vascular: Calcification of the cavernous internal carotid arteries consistent with cerebrovascular atherosclerotic disease. No signs of intracranial large vessel occlusion. Skull: Calvarium intact. Sinuses/Orbits: No sinus disease. Negative orbits. Other: None. IMPRESSION: Two separate areas of abnormal cortical and subcortical edema in the LEFT hemisphere, affecting the LEFT parietal and LEFT occipital lobes, could represent multifocal infarcts versus rapid progression of metastatic disease. These were not present on the previous CT from 02/26/2018. MRI of the brain without and with contrast recommended for further evaluation. Electronically Signed   By: Staci Righter M.D.   On: 03/03/2018  13:26   Ct Head Wo Contrast  Result Date: 02/26/2018 CLINICAL DATA:  Blackouts for 1 month. History of multiple myeloma. EXAM: CT HEAD WITHOUT CONTRAST TECHNIQUE: Contiguous axial images were obtained from the base of the skull through the vertex without intravenous contrast. COMPARISON:  12/22/2015. FINDINGS: Brain: No evidence for acute infarction, hemorrhage, mass lesion, hydrocephalus, or extra-axial fluid. Mild atrophy. Hypoattenuation of white matter, consistent with small vessel disease or post treatment effect. 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 Vascular: Calcification of the cavernous internal carotid arteries consistent with cerebrovascular atherosclerotic disease. No signs of intracranial large vessel occlusion. Skull: Calvarium intact. Small lucency in the RIGHT frontal bone (series 3 image 50) unchanged from 2017. Sinuses/Orbits: No acute finding. Other: None. IMPRESSION: Atrophy and small vessel disease. No acute intracranial findings. Electronically Signed   By: Staci Righter M.D.   On: 02/26/2018 18:23   Ct Angio Neck W Or Wo Contrast  Result Date: 03/12/2018 CLINICAL DATA:  Stroke, follow-up. Recent acute nonhemorrhagic infarcts involving the medial left occipital lobe and bilateral parietal lobes. Punctate acute infarct in the posterior left frontal lobe. EXAM: CT ANGIOGRAPHY HEAD AND NECK TECHNIQUE: Multidetector CT imaging of the head and neck was performed using the standard protocol during bolus administration of intravenous contrast. Multiplanar CT image reconstructions and MIPs were obtained to evaluate the vascular anatomy. Carotid stenosis measurements (when applicable) are obtained utilizing NASCET criteria, using the distal internal carotid diameter as the denominator. CONTRAST:  46m ISOVUE-370 IOPAMIDOL (ISOVUE-370) INJECTION 76% COMPARISON:  MRI of the brain and MRA circle-of-Willis 03/03/2018. CT head without contrast 03/12/2018. FINDINGS: CTA NECK  FINDINGS Aortic arch: A 3 vessel arch configuration is present. Atherosclerotic changes are noted at the origin of the left subclavian artery without significant stenosis. Additional calcifications are present in the distal arch. There is no aneurysm. Right carotid system: The right common carotid artery is tortuous proximally. Minimal atherosclerotic changes are noted at the right carotid bifurcation. There is no significant stenosis. There is mild tortuosity of the cervical right ICA without significant stenosis. Left carotid system: The left common carotid artery is mildly tortuous without significant stenosis. The bifurcation is unremarkable. There is moderate tortuosity of the more distal cervical left ICA without a significant stenosis. Vertebral arteries: The left vertebral artery is slightly dominant to the right. Both vertebral arteries originate from the subclavian arteries without significant stenosis. There is no  significant stenosis or vascular injury to either vertebral artery in the neck. Skeleton: Levoconvex curvature is present in the lower cervical spine. Grade 1 degenerative anterolisthesis is present at C4-5. Uncovertebral disease is present bilaterally at C5-6 and C6-7. Other neck: The soft tissues of the neck demonstrate moderate atrophy of the thyroid. Salivary glands are within normal limits bilaterally. Mediastinal and thoracic inlet adenopathy again noted, recent described on CT of the chest. Upper chest: A ring-like lesion in the right upper lobe measures 18 mm, stable. No other focal nodules are visualized. Centrilobular emphysematous changes are present. Review of the MIP images confirms the above findings CTA HEAD FINDINGS Anterior circulation: Atherosclerotic calcifications are present within the cavernous internal carotid arteries bilaterally. There is no significant stenosis relative to the ICA termini. The A1 and M1 segments are normal. Anterior communicating artery is patent. MCA  bifurcations are intact bilaterally. There is a high-grade stenosis or nonocclusive thrombus involving the superior left M2 division proximally. Posterior circulation: Left vertebral artery is dominant. PICA origins are visualized and normal. Both posterior cerebral arteries originate from basilar tip. A left posterior communicating artery contributes. PCA branch vessels are within normal limits bilaterally. Venous sinuses: Dural sinuses are patent. Straight sinus deep cerebral veins are intact. Cortical veins are within normal limits. Anatomic variants: None Review of the MIP images confirms the above findings IMPRESSION: 1. High-grade stenosis versus nonocclusive thrombus involving the proximal left superior M2 division. 2. No other focal stenosis or occlusion. 3. No aneurysm. 4. Tortuosity of the cervical vasculature without significant stenosis. 5. Aortic Atherosclerosis (ICD10-I70.0). No aneurysm or stenosis at the aortic arch. Electronically Signed   By: San Morelle M.D.   On: 03/12/2018 15:37   Ct Chest Wo Contrast  Result Date: 03/08/2018 CLINICAL DATA:  Large ulcerated gastric fundal mass found on upper endoscopy today. EXAM: CT CHEST, ABDOMEN AND PELVIS WITHOUT CONTRAST TECHNIQUE: Multidetector CT imaging of the chest, abdomen and pelvis was performed following the standard protocol without IV contrast. COMPARISON:  CT chest 01/20/2018 FINDINGS: CT CHEST FINDINGS Cardiovascular: The heart size is normal. No substantial pericardial effusion. Coronary artery calcification is evident. Atherosclerotic calcification is noted in the wall of the thoracic aorta. Right Port-A-Cath tip is positioned in the distal SVC near the junction with the RA. Mediastinum/Nodes: 12 mm short axis prevascular node seen on 17/3. Scattered small central mediastinal lymph nodes identified in the upper mediastinum. 12 mm short axis retrotracheal node visible on 16/3. No subcarinal or lower paraesophageal lymphadenopathy.  No evidence for gross hilar lymphadenopathy although assessment is limited by the lack of intravenous contrast on today's study. There is no axillary lymphadenopathy. Lungs/Pleura: The central tracheobronchial airways are patent. 19 mm ring-like lesion in the right upper lobe is stable and may represent airway impaction with associated tree-in-bud nodularity. 6 mm medial right upper lobe nodule visible on 67/4. 5 mm right middle lobe nodule visible on 102/4, stable. 1.9 x 1.2 cm irregular opacity in the posterior left upper lobe is at the site of the 4.2 x 3.8 cm mass seen on PET-CT of 10/11/2015 and may reflect residual scarring. Small left pleural effusion evident. Musculoskeletal: No worrisome lytic or sclerotic osseous abnormality. CT ABDOMEN PELVIS FINDINGS Hepatobiliary: No focal abnormality in the liver on this study without intravenous contrast. Gallbladder surgically absent. No intrahepatic or extrahepatic biliary dilation. Pancreas: No focal mass lesion. No dilatation of the main duct. No intraparenchymal cyst. No peripancreatic edema. Spleen: Subtle 2.7 x 2.0 cm hypoattenuating lesion is identified in  the dome of spleen, not definitely seen on prior. Adrenals/Urinary Tract: Right adrenal gland unremarkable. 1.6 cm left adrenal nodule stable since PET-CT of 10/01/2015. Cortical scarring noted in both kidneys. No evidence for hydroureter. The urinary bladder appears normal for the degree of distention. Stomach/Bowel: Tiny hiatal hernia noted. Wall thickening is noted in the esophagogastric junction and proximal stomach. Wall thickening extends along the medial aspect of the proximal stomach. Distal stomach unremarkable. Duodenum is normally positioned as is the ligament of Treitz. No small bowel wall thickening. No small bowel dilatation. The terminal ileum is normal. The appendix is not visualized, but there is no edema or inflammation in the region of the cecum. No gross colonic mass. No colonic wall  thickening. Vascular/Lymphatic: There is abdominal aortic atherosclerosis without aneurysm. Bulky lymphadenopathy is seen in the hepato duodenal ligament. 3.2 x 4.5 cm lymph node seen on 55/3. The 2.9 x 2.8 cm lymph node seen on 57/3. 10 mm short axis portal caval lymph node (63/3) is suspicious. No para-aortic retroperitoneal lymphadenopathy. No pelvic sidewall lymphadenopathy. Reproductive: The uterus is surgically absent. There is no adnexal mass. Other: No intraperitoneal free fluid. Musculoskeletal: No worrisome lytic or sclerotic osseous abnormality. IMPRESSION: 1. Marked wall thickening in the esophagogastric junction and proximal stomach, consistent with known neoplasm. Bulky lymphadenopathy in the gastrohepatic ligament is compatible with metastatic disease. 10 mm short axis portal caval lymph node also suspicious. 2. 2.7 cm hypoattenuating lesion in the spleen. This is not well assessed on today's noncontrast exam. Close attention recommended as metastatic disease not excluded. 3. Scattered tiny pulmonary nodules measuring up to 5 mm. Nonspecific, but close attention recommended as metastatic disease not excluded. 4. Mild lymphadenopathy noted in the upper mediastinum. 5. Circular lesion right upper lobe on axial imaging has a branching configuration on coronal and sagittal images. This is probably airway impaction related to atypical infection. Close follow-up recommended. 6. 1.9 x 1.2 cm irregular lesion posterior left upper lobe at the site of a 4 cm mass on PET-CT of 10/11/2015. Imaging features today are compatible with residua of that lesion. 7. 1.6 cm left adrenal nodule stable since 10/11/2015, likely benign. 8.  Aortic Atherosclerois (ICD10-170.0) Electronically Signed   By: Misty Stanley M.D.   On: 03/08/2018 19:19   [name] Head Wo Contrast  Result Date: 03/13/2018 CLINICAL DATA:  Stroke follow-up EXAM: MRI HEAD WITHOUT CONTRAST MRA HEAD WITHOUT CONTRAST TECHNIQUE: Multiplanar, multiecho  pulse sequences of the brain and surrounding structures were obtained without intravenous contrast. Angiographic images of the head were obtained using MRA technique without contrast. An abbreviated protocol was requested by the referring clinician, including time-of-flight MRA, axial diffusion-weighted imaging and axial susceptibility weighted imaging. COMPARISON:  None. FINDINGS: MRI HEAD FINDINGS BRAIN: There is abnormal diffusion restriction within the posterior left parietal lobe, with mild diffusion restriction in the left basal ganglia. There is a small focus of mild diffusion restriction in the posterior right parietal lobe. No midline shift or other mass effect. Susceptibility weighted imaging shows no hemorrhage. MRA HEAD FINDINGS POSTERIOR CIRCULATION: --Basilar artery: Normal. --Posterior cerebral arteries: Normal. Both originate from the basilar artery. --Superior cerebellar arteries: Normal. --Inferior cerebellar arteries: Normal anterior and posterior inferior cerebellar arteries. ANTERIOR CIRCULATION: --Intracranial internal carotid arteries: Normal. --Anterior cerebral arteries: Normal. Both A1 segments are present. Patent anterior communicating artery. --Middle cerebral arteries: Normal. --Posterior communicating arteries: Present on the left, absent on the right. IMPRESSION: 1. Multifocal acute ischemia within the posterior left MCA distribution and within the left  basal ganglia. 2. Small foci of ischemia within the right parietal lobe. 3. Normal intracranial MRA. Specifically, normal flow related enhancement of the left MCA and its branches. Electronically Signed   By: Ulyses Jarred M.D.   On: 03/13/2018 17:23   Mr Brain Wo Contrast  Result Date: 03/21/2018 CLINICAL DATA:  79 y/o F; headache, lethargy, word-finding deficit. Persistent severe headache. EXAM: MRI HEAD WITHOUT CONTRAST TECHNIQUE: Multiplanar, multiecho pulse sequences of the brain and surrounding structures were obtained without  intravenous contrast. COMPARISON:  03/20/2018 CT head.  03/13/2018 MRI and MRA head. FINDINGS: Brain: Interval development of several punctate foci of reduced diffusion scattered throughout the posterior frontal, parietal, and right occipital cortices compatible with acute/early subacute infarction. Small additional areas of acute/early subacute infarction are present within the right posterior thalamus, right posterior hippocampus, left medial lentiform nucleus, the left insula (series 6, image 21). No associated hemorrhage or mass effect. Recent infarcts identified on the prior MRI of the head within left-greater-than-right parietal lobes and the left basal ganglia no demonstrate intermediate to mildly increased diffusion compatible with subacute etiology. Small chronic infarctions are present within the left superior cerebellum, left occipital lobe, left thalamus. Stable background of early confluent nonspecific T2 FLAIR hyperintensities in subcortical and periventricular white matter are compatible with moderate chronic microvascular ischemic changes. Stable moderate volume loss of the brain. No extra-axial collection, hydrocephalus, mass effect, or herniation. Vascular: Normal flow voids. Skull and upper cervical spine: Normal marrow signal. Sinuses/Orbits: Negative. Other: Bilateral intra-ocular lens replacement. IMPRESSION: 1. Several punctate acute/early subacute infarction throughout bilateral posterior frontal, bilateral parietal, and right parietal cortices. Additional small foci of acute/early subacute infarction within right posterior thalamus, right posterior hippocampus, left medial lentiform nucleus, and left insula. No associated hemorrhage or mass effect. Multiple vascular territories suggests embolic etiology. 2. Recent infarcts identified on the prior MRI of the head within left-greater-than-right parietal lobes in the left basal ganglia now demonstrate subacute signal. 3. Stable background of  moderate chronic microvascular ischemic changes, volume loss of the brain, and small chronic infarctions. These results will be called to the ordering clinician or representative by the Radiologist Assistant, and communication documented in the PACS or zVision Dashboard. Electronically Signed   By: Kristine Garbe M.D.   On: 03/21/2018 13:56   Mr Brain Wo Contrast  Result Date: 03/13/2018 CLINICAL DATA:  Stroke follow-up EXAM: MRI HEAD WITHOUT CONTRAST MRA HEAD WITHOUT CONTRAST TECHNIQUE: Multiplanar, multiecho pulse sequences of the brain and surrounding structures were obtained without intravenous contrast. Angiographic images of the head were obtained using MRA technique without contrast. An abbreviated protocol was requested by the referring clinician, including time-of-flight MRA, axial diffusion-weighted imaging and axial susceptibility weighted imaging. COMPARISON:  None. FINDINGS: MRI HEAD FINDINGS BRAIN: There is abnormal diffusion restriction within the posterior left parietal lobe, with mild diffusion restriction in the left basal ganglia. There is a small focus of mild diffusion restriction in the posterior right parietal lobe. No midline shift or other mass effect. Susceptibility weighted imaging shows no hemorrhage. MRA HEAD FINDINGS POSTERIOR CIRCULATION: --Basilar artery: Normal. --Posterior cerebral arteries: Normal. Both originate from the basilar artery. --Superior cerebellar arteries: Normal. --Inferior cerebellar arteries: Normal anterior and posterior inferior cerebellar arteries. ANTERIOR CIRCULATION: --Intracranial internal carotid arteries: Normal. --Anterior cerebral arteries: Normal. Both A1 segments are present. Patent anterior communicating artery. --Middle cerebral arteries: Normal. --Posterior communicating arteries: Present on the left, absent on the right. IMPRESSION: 1. Multifocal acute ischemia within the posterior left MCA distribution and within the  left basal  ganglia. 2. Small foci of ischemia within the right parietal lobe. 3. Normal intracranial MRA. Specifically, normal flow related enhancement of the left MCA and its branches. Electronically Signed   By: Ulyses Jarred M.D.   On: 03/13/2018 17:23   Mr Brain Wo Contrast  Result Date: 03/03/2018 CLINICAL DATA:  Sudden onset confusion. EXAM: MRI HEAD WITHOUT CONTRAST TECHNIQUE: Multiplanar, multiecho pulse sequences of the brain and surrounding structures were obtained without intravenous contrast. COMPARISON:  Head CT 03/03/2018 FINDINGS: The examination had to be discontinued prior to completion due to altered mental status and inability to follow the technologist's instructions. Coronal and axial diffusion-weighted imaging, sagittal T1-weighted imaging and axial T2-weighted imaging were obtained. There is multifocal abnormal diffusion restriction within both hemispheres, including the left frontal lobe, both parietal lobes, left occipital lobe and both cerebellar hemispheres. The largest area of diffusion abnormalities in the posterior left parietal lobe. There is no midline shift or other mass effect. The midline structures are normal. There is moderate white matter hyperintense T2-weighted signal consistent with chronic microvascular ischemia. IMPRESSION: 1. Examination discontinued early due to patient altered mental status. 2. Multifocal bilateral acute ischemia within multiple vascular territories in both hemispheres. This may indicate a central cardiac or aortic embolic source. Electronically Signed   By: Ulyses Jarred M.D.   On: 03/03/2018 15:04   Ir Ivc Filter Plmt / S&i /img Guid/mod Sed  Result Date: 03/09/2018 INDICATION: Paradoxical embolus.  Calf vein DVT.  Pulmonary embolism. EXAM: IVC FILTER,INFERIOR VENA CAVOGRAM MEDICATIONS: None. ANESTHESIA/SEDATION: Fentanyl 75 mcg IV; Versed 1 mg IV Moderate Sedation Time:  17 minutes The patient was continuously monitored during the procedure by the  interventional radiology nurse under my direct supervision. FLUOROSCOPY TIME:  Fluoroscopy Time: 3 minutes 30 seconds (31 mGy). COMPLICATIONS: None immediate. PROCEDURE: Informed written consent was obtained from the patient after a thorough discussion of the procedural risks, benefits and alternatives. All questions were addressed. Maximal Sterile Barrier Technique was utilized including caps, mask, sterile gowns, sterile gloves, sterile drape, hand hygiene and skin antiseptic. A timeout was performed prior to the initiation of the procedure. The right neck was prepped with Betadine in a sterile fashion, and a sterile drape was applied covering the operative field. A sterile gown and sterile gloves were used for the procedure. The right jugular vein was noted to be patent initially with ultrasound. Under sonographic guidance, a micropuncture needle was inserted into the right jugular vein (Ultrasound image documentation was performed). It was removed over an 018 wire which was up-sized to a [place]. The sheath was inserted over the wire and into the IVC. IVC carbon dioxide venography was performed. The temporary Denali filter was then deployed in the infrarenal IVC. The sheath was removed and hemostasis was achieved with direct pressure. FINDINGS: IVC venography demonstrates renal inflow at the mid L2 level. There is no venous anomaly. There is no evidence of mega cava. Findings were corroborated with the accompanying CT abdomen. The final image demonstrates a deployed IVC filter with its tip at the lower L2 endplate. IMPRESSION: Successful infrarenal IVC filter placement. This is a temporary filter. It can be removed or remain in place to become permanent. PLAN: This IVC filter is potentially retrievable. The patient will be assessed for filter retrieval by Interventional Radiology in approximately 8-12 weeks. Further recommendations regarding filter retrieval, continued surveillance or declaration of device  permanence, will be made at that time. Electronically Signed   By: Rodena Goldmann.D.  On: 03/09/2018 12:15   [place]  Result Date: 03/14/2018 INDICATION: Acute onset of aphasia, and right-sided weakness. 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 EXAM: 1. EMERGENT LARGE VESSEL OCCLUSION THROMBOLYSIS (anterior CIRCULATION) COMPARISON:  CT angiogram of the head and neck of 03/12/2018. MEDICATIONS: Ancef 2 g IV antibiotic was administered within 1 hour of the procedure. ANESTHESIA/SEDATION: General anesthesia. CONTRAST:  Isovue 300 approximately 60 cc. FLUOROSCOPY TIME:  Fluoroscopy Time: 52 minutes 40 seconds (1618 mGy). COMPLICATIONS: None immediate. TECHNIQUE: Following a full explanation of the procedure along with the potential associated complications, an informed witnessed consent was obtained from the patient's daughter. The risks of intracranial hemorrhage of 10%, worsening neurological deficit, ventilator dependency, death and inability to revascularize were all reviewed in detail with the patient's daughter. The patient was then put under general anesthesia by the Department of Anesthesiology at [facility]. The right groin was prepped and draped in the usual sterile fashion. Thereafter using modified Seldinger technique, transfemoral access into the right common femoral artery was obtained without difficulty. Over a 0.035 inch guidewire a 5 French Pinnacle sheath was inserted. Through this, and also over a 0.035 inch guidewire a 5 [place] JB 1 catheter was advanced to the aortic arch region and selectively positioned in the left common carotid artery. FINDINGS: The left common carotid arteriogram demonstrates the left external carotid artery at its origin to be mildly narrowed. Its branches are normal in caliber. The left internal carotid artery at the bulb is patent. There is a moderate to severe tortuosity of the mid cervical segment of the  left internal carotid artery without evidence of kinking. The petrous, cavernous and supraclinoid segments are widely patent. There is a mild broad-based prominence of the inferior aspect of the proximal cavernous segment. The left anterior cerebral artery opacifies into the capillary and venous phases. There is transient cross-filling via the anterior communicating artery of the right anterior cerebral artery A2 segment and distally. The left middle cerebral artery demonstrates complete angiographic occlusion of the left A1 segment distal to the origin of the anterior temporal branch. The lateral delayed arterial and capillary phase demonstrates retrograde reconstitution of the perisylvian branches from collaterals arising from the pericallosal and callosal marginal branches. Also demonstrated is early opacification in the mid superior sagittal sinus region on the left from a middle meningeal posterior parietal branch. 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 PROCEDURE: ENDOVASCULAR COMPLETE REVASCULARIZATION OF OCCLUDED LEFT MIDDLE CEREBRAL ARTERY M1 SEGMENT WITH 2 PASSES WITH THE 5 MM X 33 MM EMBOTRAP RETRIEVAL DEVICE ACHIEVING A TICI 3 REVASCULARIZATION. The diagnostic JB 1 catheter in the left common carotid artery was then exchanged over a 0.035 inch 300 cm Rosen exchange guidewire for a 5 cm 8 [place] Neuron Max guide sheath. Good aspiration obtained from the hub of the Neuron Max sheath. A gentle control arteriogram demonstrated no evidence of intraluminal filling defects or dissections. The Neuron Max sheath was advanced to the hub with the tip protruding into the bulb of the left internal carotid artery. Through this, in a coaxial manner and with constant heparinized saline infusion, a Jet D 135 mm intermediary catheter inside of which was a velocity 150 cm microcatheter combination was advanced over a 0.014 inch Softip Synchro micro guidewire to the distal end of the Neuron Max sheath.  Using biplane roadmap technique and constant fluoroscopic guidance, the combination was navigated through the moderate to  moderately severe tortuosity of the cervical left ICA into the supraclinoid segment. The micro guidewire was then gently advanced into the occluded left middle cerebral artery dominant inferior division M2 M3 region followed by the microcatheter. Good aspiration was obtained from the hub of the microcatheter. A gentle control arteriogram performed through the hub of the microcatheter demonstrated the tip to be in safe position. This was then connected to continuous heparinized saline infusion. The Jet D catheter could only be advanced to the distal cavernous segment. The Embotrap retrieval device was then deployed in the defined positions from distal to proximal by unsheathing the microcatheter. A control arteriogram performed through the Jet D intermediary catheter in the left internal carotid artery demonstrated a TICI 2b revascularization. Thereafter, with constant aspiration being applied using the Penumbra aspiration device, the combination of the retrieval device the microcatheter and the Jet D intermediary catheter was retrieved and removed as aspiration was continued. Free aspiration of blood was noted at the hub of the Neuron Max sheath. A gentle control arteriogram performed through the sheath demonstrated continued occlusion of the left middle cerebral artery. Following this, a combination of a 95 cm FlowGate balloon guide catheter which had been prepped with 50% contrast and 50% heparinized saline infusion was advanced and positioned in the proximal 1/3 of the left cervical ICA. Free aspiration was noted from the hub of the [facility] guide catheter. A control arteriogram performed through this in the proximal left internal carotid artery demonstrated no change in the extracranial or intracranial circulation. A combination of a 6 French 132 cm Catalyst guide catheter inside of which was  an [employer] microcatheter was advanced over a 0.014 inch Softip Synchro micro guidewire to the supraclinoid left ICA. The micro guidewire was then advanced into the dominant M2 M3 region of the inferior division of the left middle cerebral artery. This was then followed by the microcatheter. The guidewire was removed. After having verified safe position of tip of the microcatheter, the 5 mm x 33 mm Embotrap retrieval device was then deployed in the manner as described above. The 132 cm 6 [place] Catalyst guide catheter was advanced and engaged into the proximal aspect of the clot itself. Thereafter as constant aspiration was applied with a Peumbra aspiration device at the hub of the 6 [place] Catalyst guide catheter, proximal flow arrest was established by inflating the balloon of the [facility] guide catheter in the left internal carotid artery. As constant aspiration was applied at the hub of the of FlowGate guide catheter with a 60 mL syringe, and the Penumbra suction device at the hub of the Catalyst guide catheter the combination of the retrieval device, the microcatheter and the Catalyst guide catheter were retrieved and removed as constant aspiration was continued at the hub of the [facility] guide catheter with a 60 mL syringe. Following deflation, free aspiration was obtained as well as free back bleed at the hub of the [facility] guide catheter. A control arteriogram performed through the [facility] guide catheter in the proximal left internal carotid artery demonstrated complete revascularization of the previously occluded left middle cerebral artery. Patency of the anterior cerebral artery circulation involving the anterior cerebral artery and the middle cerebral artery was established. Throughout the procedure, the patient's blood pressure and neurological status remained stable. No angiographic evidence of extravasation or of mass-effect was noted. The [facility] guide catheter and the neurovascular sheath  were then retrieved into the abdominal aorta and exchanged over a J-tip guidewire for an 8  French Pinnacle sheath. This in turn was removed with the successful application of a 7 [place] ExoSeal closure device with hemostasis in the right groin puncture site. The right groin appeared soft without evidence of a hematoma or bleeding. Distal pulses remained Dopplerable in the dorsalis pedis, and posterior regions bilaterally. An immediate post treatment CT of the brain revealed no evidence of a hemorrhage, or mass effect or midline shift. There appeared to be contrast stasis in the caudate head of the left basal ganglia, and also the anterior aspect of the putamen. The patient's general anesthesia was then reversed. The patient was extubated. Upon recovery the patient was able to breathe on her own and move all 4 limbs spontaneously and to command. She was then transferred to the neuro ICU to continue post thrombectomy management. IMPRESSION: Status post endovascular complete revascularization of occluded left middle cerebral artery M1 segment with 2 passes of the 5 mm x 33 mm Embotrap retrieval device achieving a TICI 3 revascularization. PLAN: Follow-up in the out patient clinic 4 weeks post discharge. Electronically Signed   By: Luanne Bras M.D.   On: 03/13/2018 15:13   Dg Abd Acute 2+v W 1v Chest  Result Date: 02/26/2018 CLINICAL DATA:  Lapse in memory. EXAM: DG ABDOMEN ACUTE W/ 1V CHEST COMPARISON:  None. FINDINGS: There is no evidence of dilated bowel loops or free intraperitoneal air. No radiopaque calculi or other significant radiographic abnormality is seen. Heart size and mediastinal contours are within normal limits. Both lungs are clear. Calcified tortuous aorta. Degenerative RIGHT SI joint. Port-A-Cath, RIGHT chest subclavian approach, tip RIGHT atrium. IMPRESSION: Negative abdominal radiographs. No acute cardiopulmonary disease. Electronically Signed   By: Staci Righter M.D.   On: 02/26/2018  18:16   Dg Swallowing Func-speech Pathology  Result Date: 03/06/2018 Objective Swallowing Evaluation: Type of Study: MBS-Modified Barium Swallow Study  Patient Details Name: Nancy Norris MRN: [id number] Date of Birth: [birth year] Today's Date: 03/06/2018 Time: SLP Start Time (ACUTE ONLY): 0825 -SLP Stop Time (ACUTE ONLY): 0840 SLP Time Calculation (min) (ACUTE ONLY): 15 min Past Medical History: Past Medical History: Diagnosis Date . Anemia  . Anginal pain ([place])   used NTG x 2 May 31 and 06/15/13  . Anxiety  . Arthritis  . B12 deficiency 12/04/2014 . Breast cancer ([place])  . Complication of anesthesia  . COPD (chronic obstructive pulmonary disease) ([place])  . 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 Depression  . Dizziness  . Dyspnea  . Fibromyalgia  . Fibromyalgia  . GERD (gastroesophageal reflux disease)  . Headache(784.0)  . Heart murmur  . Hemoptysis 10/21/2015 . History of blood transfusion   last one May 12  . Hx of cardiovascular stress test   LexiScan with low level exercise Myoview (02/2013): No ischemia, EF 72%; normal study . Hx of echocardiogram   a.  Echocardiogram (12/26/2012): EF 69-67%, grade 1 diastolic dysfunction;   b.  Echocardiogram (02/2013): EF 55-60%, no WMA, trivial effusion . Hyperkalemia  . Hyponatremia  . Hypotension  . Hypothyroidism  . Mucositis  . Multiple myeloma  . Myocardial infarction [facility])   in past, patient was unaware.  . Neuropathy  . Nodule of left lung 09/13/2015 . Pain in joint, pelvic region and thigh 07/07/2015 . Pneumonia   several . PONV (postoperative nausea and vomiting) 2008  after mastestomy Past Surgical History: Past Surgical History: Procedure Laterality Date . ABDOMINAL HYSTERECTOMY  1981 . AV FISTULA PLACEMENT Left 06/19/2013  Procedure: CREATION OF LEFT ARM ARTERIOVENOUS (AV) FISTULA ;  Surgeon: Angelia Mould, MD;  Location: [place];  Service: Vascular;  Laterality: Left; . BREAST RECONSTRUCTION   . BREAST SURGERY Right   reduction . CATARACT EXTRACTION,  BILATERAL   . CHOLECYSTECTOMY  1971 . COLONOSCOPY   . EYE SURGERY Bilateral   lens implant . history of Port removal   . LUNG BIOPSY  10/21/2015 . MASTECTOMY Left 2008 . PORTACATH PLACEMENT  12/2012  has had 2 . RESECTION OF ARTERIOVENOUS FISTULA ANEURYSM Left 06/04/2017  Procedure: LIGATION ANEURYSM OF LEFT ARTERIOVENOUS FISTULA;  Surgeon: Angelia Mould, MD;  Location: [place];  Service: Vascular;  Laterality: Left; . Status post stem cell transplant on September 28, 2008.   HPI: Patient is a 78 y/o female presents with confusion, amnesia and HA. Head CT- left occipital and left frontal parietial infarcts. MRI- multiple posterior/anterior bilateral infarcts. PMH includes multiple myeloma currently getting chemo, MI, COPD, hypotension, breast ca, fibromyalgia.  Subjective: pleasant Assessment / Plan / Recommendation CHL IP CLINICAL IMPRESSIONS 03/06/2018 Clinical Impression Pt demonstrated reduced lingual retraction which resulted in mild vallecular residue and transient penetration (PAS 2) with thin liquids when consecutive swallows were used. [name] independent use of dry secondary swallows was effective in reducing the residue and residue was eliminated with use of aliquid wash. Overall, her swallow mechanism appears to be within functional limits with consideration of her age and she does not appear to be at significant risk of aspiration at this time. It is recommended that a regular texture diet be continued but, with consideration of her reported xerostomia, liquid washes may be beneficial to improve bolus flow. SLP will continue to follow for cognitive-linguisitic treatment and will see the pt once more for swallowing to ensure her observance of compensatory strategies. SLP Visit Diagnosis Dysphagia, unspecified (R13.10) Attention and concentration deficit following -- Frontal lobe and executive function deficit following -- Impact on safety and function No limitations   CHL IP TREATMENT RECOMMENDATION  03/06/2018 Treatment Recommendations Therapy as outlined in treatment plan below   Prognosis 03/06/2018 Prognosis for Safe Diet Advancement Good Barriers to Reach Goals Cognitive deficits Barriers/Prognosis Comment -- CHL IP DIET RECOMMENDATION 03/06/2018 SLP Diet Recommendations Regular solids;Thin liquid Liquid Administration via Cup;Straw Medication Administration Whole meds with liquid Compensations Small sips/bites;Slow rate;Follow solids with liquid Postural Changes Remain semi-upright after after feeds/meals (Comment);Seated upright at 90 degrees   No flowsheet data found.  CHL IP FOLLOW UP RECOMMENDATIONS 03/06/2018 Follow up Recommendations Inpatient Rehab   CHL IP FREQUENCY AND DURATION 03/06/2018 Speech Therapy Frequency (ACUTE ONLY) min 2x/week Treatment Duration 2 weeks      CHL IP ORAL PHASE 03/06/2018 Oral Phase WFL Oral - Pudding Teaspoon -- Oral - Pudding Cup -- Oral - Honey Teaspoon -- Oral - Honey Cup -- Oral - Nectar Teaspoon -- Oral - Nectar Cup -- Oral - Nectar Straw -- Oral - Thin Teaspoon -- Oral - Thin Cup -- Oral - Thin Straw -- Oral - Puree -- Oral - Mech Soft -- Oral - Regular -- Oral - Multi-Consistency -- Oral - Pill -- Oral Phase - Comment --  CHL IP PHARYNGEAL PHASE 03/06/2018 Pharyngeal Phase WFL Pharyngeal- Pudding Teaspoon -- Pharyngeal -- Pharyngeal- Pudding Cup -- Pharyngeal -- Pharyngeal- Honey Teaspoon -- Pharyngeal -- Pharyngeal- Honey Cup -- Pharyngeal -- Pharyngeal- Nectar Teaspoon -- Pharyngeal -- Pharyngeal- Nectar Cup -- Pharyngeal -- Pharyngeal- Nectar Straw -- Pharyngeal -- Pharyngeal- Thin Teaspoon -- Pharyngeal -- Pharyngeal- Thin Cup Penetration/Aspiration during swallow Pharyngeal Material enters airway, remains ABOVE vocal cords  then ejected out Pharyngeal- Thin Straw Penetration/Aspiration during swallow Pharyngeal Material enters airway, remains ABOVE vocal cords then ejected out Pharyngeal- Puree Penetration/Aspiration before swallow;Penetration/Aspiration during  swallow;Pharyngeal residue - valleculae Pharyngeal Material enters airway, remains ABOVE vocal cords then ejected out Pharyngeal- Mechanical Soft -- Pharyngeal -- Pharyngeal- Regular Pharyngeal residue - valleculae Pharyngeal -- Pharyngeal- Multi-consistency -- Pharyngeal -- Pharyngeal- Pill -- Pharyngeal -- Pharyngeal Comment --  Shanika I. Hardin Negus, [place], [place] Office number [phone] Pager [place] 03/06/2018, 9:42 AM              Mr Nancy Norris Wo Contrast  Result Date: 03/03/2018 CLINICAL DATA:  Stroke follow-up EXAM: MRA HEAD WITHOUT CONTRAST TECHNIQUE: Angiographic images of the Circle of Willis were obtained using MRA technique without intravenous contrast. COMPARISON:  Brain MRI 03/03/2018 FINDINGS: POSTERIOR CIRCULATION: --Basilar artery: Normal. --Posterior cerebral arteries: Normal. Both originate from the basilar artery. --Superior cerebellar arteries: Normal. --Inferior cerebellar arteries: Normal anterior and posterior inferior cerebellar arteries. ANTERIOR CIRCULATION: --Intracranial internal carotid arteries: Normal. --Anterior cerebral arteries: Normal. Both A1 segments are present. Patent anterior communicating artery. --Middle cerebral arteries: Normal. --Posterior communicating arteries: Absent bilaterally. IMPRESSION: Normal intracranial MRA. Electronically Signed   By: Ulyses Jarred M.D.   On: 03/03/2018 22:51   Ir Percutaneous Art Thrombectomy/infusion Intracranial Inc Diag Angio  Result Date: 03/14/2018 INDICATION: Acute onset of aphasia, and right-sided weakness. 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 EXAM: 1. EMERGENT LARGE VESSEL OCCLUSION THROMBOLYSIS (anterior CIRCULATION) COMPARISON:  CT angiogram of the head and neck of 03/12/2018. MEDICATIONS: Ancef 2 g IV antibiotic was administered within 1 hour of the procedure. ANESTHESIA/SEDATION: General anesthesia. CONTRAST:  Isovue 300  approximately 60 cc. FLUOROSCOPY TIME:  Fluoroscopy Time: 52 minutes 40 seconds (1618 mGy). COMPLICATIONS: None immediate. TECHNIQUE: Following a full explanation of the procedure along with the potential associated complications, an informed witnessed consent was obtained from the patient's daughter. The risks of intracranial hemorrhage of 10%, worsening neurological deficit, ventilator dependency, death and inability to revascularize were all reviewed in detail with the patient's daughter. The patient was then put under general anesthesia by the Department of Anesthesiology at [facility]. The right groin was prepped and draped in the usual sterile fashion. Thereafter using modified Seldinger technique, transfemoral access into the right common femoral artery was obtained without difficulty. Over a 0.035 inch guidewire a 5 French Pinnacle sheath was inserted. Through this, and also over a 0.035 inch guidewire a 5 [place] JB 1 catheter was advanced to the aortic arch region and selectively positioned in the left common carotid artery. FINDINGS: The left common carotid arteriogram demonstrates the left external carotid artery at its origin to be mildly narrowed. Its branches are normal in caliber. The left internal carotid artery at the bulb is patent. There is a moderate to severe tortuosity of the mid cervical segment of the left internal carotid artery without evidence of kinking. The petrous, cavernous and supraclinoid segments are widely patent. There is a mild broad-based prominence of the inferior aspect of the proximal cavernous segment. The left anterior cerebral artery opacifies into the capillary and venous phases. There is transient cross-filling via the anterior communicating artery of the right anterior cerebral artery A2 segment and distally. The left middle cerebral artery demonstrates complete angiographic occlusion of the left A1 segment distal to the origin of the anterior temporal branch.  The lateral delayed arterial and capillary phase demonstrates retrograde reconstitution  of the perisylvian branches from collaterals arising from the pericallosal and callosal marginal branches. Also demonstrated is early opacification in the mid superior sagittal sinus region on the left from a middle meningeal posterior parietal branch. 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 PROCEDURE: ENDOVASCULAR COMPLETE REVASCULARIZATION OF OCCLUDED LEFT MIDDLE CEREBRAL ARTERY M1 SEGMENT WITH 2 PASSES WITH THE 5 MM X 33 MM EMBOTRAP RETRIEVAL DEVICE ACHIEVING A TICI 3 REVASCULARIZATION. The diagnostic JB 1 catheter in the left common carotid artery was then exchanged over a 0.035 inch 300 cm Rosen exchange guidewire for a 5 cm 8 [place] Neuron Max guide sheath. Good aspiration obtained from the hub of the Neuron Max sheath. A gentle control arteriogram demonstrated no evidence of intraluminal filling defects or dissections. The Neuron Max sheath was advanced to the hub with the tip protruding into the bulb of the left internal carotid artery. Through this, in a coaxial manner and with constant heparinized saline infusion, a Jet D 135 mm intermediary catheter inside of which was a velocity 150 cm microcatheter combination was advanced over a 0.014 inch Softip Synchro micro guidewire to the distal end of the Neuron Max sheath. Using biplane roadmap technique and constant fluoroscopic guidance, the combination was navigated through the moderate to moderately severe tortuosity of the cervical left ICA into the supraclinoid segment. The micro guidewire was then gently advanced into the occluded left middle cerebral artery dominant inferior division M2 M3 region followed by the microcatheter. Good aspiration was obtained from the hub of the microcatheter. A gentle control arteriogram performed through the hub of the microcatheter demonstrated the tip to be in safe position. This was then connected to continuous  heparinized saline infusion. The Jet D catheter could only be advanced to the distal cavernous segment. The Embotrap retrieval device was then deployed in the defined positions from distal to proximal by unsheathing the microcatheter. A control arteriogram performed through the Jet D intermediary catheter in the left internal carotid artery demonstrated a TICI 2b revascularization. Thereafter, with constant aspiration being applied using the Penumbra aspiration device, the combination of the retrieval device the microcatheter and the Jet D intermediary catheter was retrieved and removed as aspiration was continued. Free aspiration of blood was noted at the hub of the Neuron Max sheath. A gentle control arteriogram performed through the sheath demonstrated continued occlusion of the left middle cerebral artery. Following this, a combination of a 95 cm FlowGate balloon guide catheter which had been prepped with 50% contrast and 50% heparinized saline infusion was advanced and positioned in the proximal 1/3 of the left cervical ICA. Free aspiration was noted from the hub of the [facility] guide catheter. A control arteriogram performed through this in the proximal left internal carotid artery demonstrated no change in the extracranial or intracranial circulation. A combination of a 6 French 132 cm Catalyst guide catheter inside of which was an [employer] microcatheter was advanced over a 0.014 inch Softip Synchro micro guidewire to the supraclinoid left ICA. The micro guidewire was then advanced into the dominant M2 M3 region of the inferior division of the left middle cerebral artery. This was then followed by the microcatheter. The guidewire was removed. After having verified safe position of tip of the microcatheter, the 5 mm x 33 mm Embotrap retrieval device was then deployed in the manner as described above. The 132 cm 6 [place] Catalyst guide catheter was advanced and engaged into the proximal aspect of the  clot itself.  Thereafter as constant aspiration was applied with a Peumbra aspiration device at the hub of the 6 [place] Catalyst guide catheter, proximal flow arrest was established by inflating the balloon of the [facility] guide catheter in the left internal carotid artery. As constant aspiration was applied at the hub of the of FlowGate guide catheter with a 60 mL syringe, and the Penumbra suction device at the hub of the Catalyst guide catheter the combination of the retrieval device, the microcatheter and the Catalyst guide catheter were retrieved and removed as constant aspiration was continued at the hub of the [facility] guide catheter with a 60 mL syringe. Following deflation, free aspiration was obtained as well as free back bleed at the hub of the [facility] guide catheter. A control arteriogram performed through the [facility] guide catheter in the proximal left internal carotid artery demonstrated complete revascularization of the previously occluded left middle cerebral artery. Patency of the anterior cerebral artery circulation involving the anterior cerebral artery and the middle cerebral artery was established. Throughout the procedure, the patient's blood pressure and neurological status remained stable. No angiographic evidence of extravasation or of mass-effect was noted. The [facility] guide catheter and the neurovascular sheath were then retrieved into the abdominal aorta and exchanged over a J-tip guidewire for an 8 French Pinnacle sheath. This in turn was removed with the successful application of a 7 [place] ExoSeal closure device with hemostasis in the right groin puncture site. The right groin appeared soft without evidence of a hematoma or bleeding. Distal pulses remained Dopplerable in the dorsalis pedis, and posterior regions bilaterally. An immediate post treatment CT of the brain revealed no evidence of a hemorrhage, or mass effect or midline shift. There appeared to be contrast stasis in the  caudate head of the left basal ganglia, and also the anterior aspect of the putamen. The patient's general anesthesia was then reversed. The patient was extubated. Upon recovery the patient was able to breathe on her own and move all 4 limbs spontaneously and to command. She was then transferred to the neuro ICU to continue post thrombectomy management. IMPRESSION: Status post endovascular complete revascularization of occluded left middle cerebral artery M1 segment with 2 passes of the 5 mm x 33 mm Embotrap retrieval device achieving a TICI 3 revascularization. PLAN: Follow-up in the out patient clinic 4 weeks post discharge. Electronically Signed   By: Luanne Bras M.D.   On: 03/13/2018 15:13   Ct Head Code Stroke Wo Contrast`  Result Date: 03/12/2018 CLINICAL DATA:  Code stroke.  Slurred speech. EXAM: CT HEAD WITHOUT CONTRAST TECHNIQUE: Contiguous axial images were obtained from the base of the skull through the vertex without intravenous contrast. COMPARISON:  Head CT 03/05/2018 and MRI 03/03/2018 FINDINGS: Brain: Evolving subacute infarcts are again seen, small to moderate in size in the left parietal lobe and small in the left occipital lobe without evidence of interval infarct extension. Small right parietal and cerebellar infarcts on MRI are not well demonstrated. A small chronic left cerebellar infarct is again noted. No new infarct, intracranial hemorrhage, mass, midline shift, or extra-axial fluid collection is identified. The ventricles and sulci are within normal limits for age. There is mild chronic small vessel ischemic disease affecting the cerebral white matter bilaterally. Vascular: Calcified atherosclerosis at the skull base. No hyperdense vessel. Skull: No fracture or focal osseous lesion. Sinuses/Orbits: Visualized paranasal sinuses and mastoid air cells are clear. Bilateral cataract extraction is noted. Other: None. ASPECTS [facility] Stroke Program Early CT Score) Not scored  given  recent evolving infarcts. IMPRESSION: Evolving subacute infarcts as above without evidence of new intracranial abnormality. These results were communicated to Dr. Erlinda Hong at 2:53 pm on 03/12/2018 by text page via the [facility] messaging system. Electronically Signed   By: Logan Bores M.D.   On: 03/12/2018 14:53   Vas US Carotid (at [place] Only)  Result Date: 03/06/2018 Carotid Arterial Duplex Study Indications: CVA. Performing Technologist: Oliver Hum RVT  Examination Guidelines: A complete evaluation includes B-mode imaging, spectral Doppler, color Doppler, and power Doppler as needed of all accessible portions of each vessel. Bilateral testing is considered an integral part of a complete examination. Limited examinations for reoccurring indications may be performed as noted.  Right Carotid Findings: +----------+--------+-------+--------+----------------------+------------------+           PSV cm/sEDV    StenosisDescribe              Comments                             cm/s                                                    +----------+--------+-------+--------+----------------------+------------------+ CCA Prox  68      18                                   intimal thickening +----------+--------+-------+--------+----------------------+------------------+ CCA Distal77      20                                   intimal thickening +----------+--------+-------+--------+----------------------+------------------+ ICA Prox  72      21             smooth and                                                                heterogenous                             +----------+--------+-------+--------+----------------------+------------------+ ICA Distal70      27                                                      +----------+--------+-------+--------+----------------------+------------------+ ECA       63      13                                                       +----------+--------+-------+--------+----------------------+------------------+ +----------+--------+-------+--------+-------------------+           PSV cm/sEDV cmsDescribeArm Pressure (mmHG) +----------+--------+-------+--------+-------------------+ [id number]                                         +----------+--------+-------+--------+-------------------+ +---------+--------+--+--------+-+---------+  VertebralPSV cm/s31EDV cm/s9Antegrade +---------+--------+--+--------+-+---------+  Left Carotid Findings: +----------+--------+--------+--------+-----------------------+--------+           PSV cm/sEDV cm/sStenosisDescribe               Comments +----------+--------+--------+--------+-----------------------+--------+ CCA Prox  82      23              smooth and heterogenous         +----------+--------+--------+--------+-----------------------+--------+ CCA Distal64      19              smooth and heterogenous         +----------+--------+--------+--------+-----------------------+--------+ ICA Prox  66      20              smooth and heterogenous         +----------+--------+--------+--------+-----------------------+--------+ ICA Distal81      32                                              +----------+--------+--------+--------+-----------------------+--------+ ECA       70      17                                              +----------+--------+--------+--------+-----------------------+--------+ +----------+--------+--------+--------+-------------------+ SubclavianPSV cm/sEDV cm/sDescribeArm Pressure (mmHG) +----------+--------+--------+--------+-------------------+           96                                          +----------+--------+--------+--------+-------------------+ +---------+--------+--+--------+--+---------+ VertebralPSV cm/s54EDV cm/s19Antegrade +---------+--------+--+--------+--+---------+  Summary: Right Carotid:  Velocities in the right ICA are consistent with a 1-39% stenosis. Left Carotid: Velocities in the left ICA are consistent with a 1-39% stenosis. Vertebrals: Bilateral vertebral arteries demonstrate antegrade flow. *See table(s) above for measurements and observations.  Electronically signed by Antony Contras MD on 03/06/2018 at 11:57:28 AM.    Final    Vas [place] Lower Extremity Venous (dvt)  Result Date: 03/07/2018  Lower Venous Study Other Indications: Stroke with PFO. Performing Technologist: June Leap RDMS, RVT  Examination Guidelines: A complete evaluation includes B-mode imaging, spectral Doppler, color Doppler, and power Doppler as needed of all accessible portions of each vessel. Bilateral testing is considered an integral part of a complete examination. Limited examinations for reoccurring indications may be performed as noted.  Right Venous Findings: +---------+---------------+---------+-----------+----------+-------------------+          CompressibilityPhasicitySpontaneityPropertiesSummary             +---------+---------------+---------+-----------+----------+-------------------+ CFV      Full           Yes      Yes                                      +---------+---------------+---------+-----------+----------+-------------------+ SFJ      Full                                                             +---------+---------------+---------+-----------+----------+-------------------+  FV Prox  Full                                                             +---------+---------------+---------+-----------+----------+-------------------+ FV Mid   Full                                                             +---------+---------------+---------+-----------+----------+-------------------+ FV DistalFull                                                             +---------+---------------+---------+-----------+----------+-------------------+ PFV      Full                                                              +---------+---------------+---------+-----------+----------+-------------------+ POP      Full           Yes      Yes                                      +---------+---------------+---------+-----------+----------+-------------------+ PTV      Full                                         not well visualized +---------+---------------+---------+-----------+----------+-------------------+ PERO     Full                                         not well visualized +---------+---------------+---------+-----------+----------+-------------------+  Left Venous Findings: +---------+---------------+---------+-----------+----------+------------------+          CompressibilityPhasicitySpontaneityPropertiesSummary            +---------+---------------+---------+-----------+----------+------------------+ CFV      Full           Yes      Yes                                     +---------+---------------+---------+-----------+----------+------------------+ SFJ      Full                                                            +---------+---------------+---------+-----------+----------+------------------+ FV Prox  Full                                                            +---------+---------------+---------+-----------+----------+------------------+  FV Mid   Full                                                            +---------+---------------+---------+-----------+----------+------------------+ FV DistalFull                                                            +---------+---------------+---------+-----------+----------+------------------+ PFV      Full                                                            +---------+---------------+---------+-----------+----------+------------------+ POP      Full           Yes      Yes                                      +---------+---------------+---------+-----------+----------+------------------+ PTV      None                                         isolated proximal                                                        calf               +---------+---------------+---------+-----------+----------+------------------+ PERO     None                                         isolated proximal                                                        calf               +---------+---------------+---------+-----------+----------+------------------+    Summary: Right: There is no evidence of deep vein thrombosis in the lower extremity. No cystic structure found in the popliteal fossa. Left: Findings consistent with acute deep vein thrombosis involving the left posterior tibial vein, and left peroneal vein. No cystic structure found in the popliteal fossa.  *See table(s) above for measurements and observations. Electronically signed by Deitra Mayo MD on 03/07/2018 at 7:51:58 PM.    Final    Dg Esophagus W Single Cm (sol Or Thin Ba)  Result Date: 03/06/2018 CLINICAL DATA:  Dysphagia EXAM: ESOPHOGRAM/BARIUM SWALLOW TECHNIQUE: Single contrast examination was performed using thick  and thin barium FLUOROSCOPY TIME:  Fluoroscopy Time:  1 minutes, 48 seconds Radiation Exposure Index (if provided by the fluoroscopic device): 17.9 mGy Number of Acquired Spot Images: None COMPARISON:  CT chest from 01/20/2018 FINDINGS: The patient was relatively frail, and I elected not to give gas crystals. With patient laying down, swallows were performed in the LPO position. Mild distal esophageal fold thickening. No obvious ulceration. Mildly dilated esophagus. Primary peristaltic waves in the esophagus were disrupted on 4/4 swallows in the mid esophagus level. Initial swallows demonstrate some lobularity and irregularity along the upper margin of the gastroesophageal junction for example on image 35/1. Tumor or ulceration  not readily excluded given this appearance. In the LPO position, there is poor filling of the gastroesophageal junction, with only slow percolated shin of contrast from the dilated esophagus into the stomach. A 13 mm barium tablet passed without difficulty into the stomach. Limited assessment of the upper stomach demonstrates no appreciable gastric diverticulum along the right margin of the upper stomach, raising concern that the rounded density in this vicinity on the recent chest CT from 01/20/2018 measuring 3.6 by 2.6 cm on that exam probably represents gastrohepatic ligament pathologic adenopathy rather than a diverticulum. IMPRESSION: 1. Nonspecific esophageal dysmotility disorder with disruption of primary peristaltic waves in the mid esophagus. 2. Distal esophageal irregularity potentially with ulceration. Difficult to exclude tumor of the distal esophagus. Endoscopy is recommended. 3. 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 Electronically Signed   By: Van Clines M.D.   On: 03/06/2018 09:31    Labs:  Basic Metabolic Panel: Recent Labs  Lab 03/20/18 0525 03/23/18 0334 03/24/18 0417  NA 137 135 138  K 3.8 3.1* 3.7  CL 108 105 112*  CO2 22 20* 18*  GLUCOSE 116* 99 102*  BUN 16 28* 24*  CREATININE 2.59* 2.49* 2.36*  CALCIUM 8.9 8.1* 8.2*  MG  --   --  2.1    CBC: Recent Labs  Lab 03/23/18 0334 03/24/18 0417 03/25/18 0401  WBC 7.8 5.5 5.8  NEUTROABS  --  3.4  --   HGB 8.1* 7.6* 10.9*  HCT 25.1* 24.2* 33.3*  MCV 103.3* 105.2* 94.3  PLT 58* 55* 48*    CBG: No results for input(s): GLUCAP in the last 168 hours.   Family history mother with asthma, sister with cancer and a brother with hyperlipidemia  Brief HPI:    Keyarra Rendall  is a 78 year old right handed female history of multiple myeloma followed by Dr. Salina April, remote breast cancer as well as COPD remote tobacco abuse as well as complicated history of anemia thrombocytopenia and fibromyalgia with CK D stage IV and hypotension maintained on ProAmatine. She lives alone and was independent prior to admission she has a daughter in the area. Initially presented 03/03/2018 with altered mental status difficulty swallowing headache 2 weeks. Lactic acid 1.2 mildly elevated ammonia level. She was seen in the ED on 02/26/2022 episodes of dizziness as well as headache and a fall. CT of the head was negative. Hemoglobin 7.9 with noted history of chronic anemia urine culture grew 100,000 Escherichia coli she was discharged home on Keflex. On latest admission cranial CT scan repeated showing 2 separate areas of abnormal cortical and subcortical edema in the left hemisphere affecting  the left parietal lobe occipital lobe that was not present on 02/26/2018. MRI the brain showed left greater than right small to moderate size multifocal infarctions. MRA unremarkable. Patient did not receive TPA. Echocardiogram with ejection fraction of 38% normal systolic function. Carotid Dopplers with no ICA stenosis. Blood cultures positive for staph species with Proteus placed on antibiotic therapy with vancomycin and ceftriaxone 03/06/2018. Swallow study completed as well as esophagram showed nonspecific esophageal dysmotility no appreciable gastric diverticulum initially on a regular diet. TEE completed 03/07/2018 with ejection fraction 65% no thrombus or vegetation. Small PFO by color Doppler. She did remain on aspirin for CVA prophylaxis. Gastroenterology consulted for noted abnormal barium swallow question GI bleed noted history of chronic anemia upper endoscopy completed 03/08/2020 showing a large fungating malignant appearing ulcerated proximal stomach mass that had areas of blood seen consistent with recent  bleeding and biopsies obtained. Mass likely causing partial obstruction leading to dysphagia. Recommendation CT of abdomen and pelvis showing marked wall thickening in the esophagogastric junction and proximal stomach consistent with known neoplasm. Bulky lymphadenopathy in the gastrohepatic ligament compatible with metastatic disease. There is a 2.7 cm hypoattenuating lesion in the spleen as well as scattered tiny pulmonary nodules. Oncology services follow-up in regards to findings pathology at that time was pending. Hospital course complicated by acute DVT left posterior tibial vein radiology services consulted IVC filter placed per interventional radiology. On 03/10/2018 cardiology again followed for nonspecific chest discomfort dizziness troponin 2.15 EKG did show new Q waves on her EKG in the inferior leads. Repeat echocardiogram again ejection fraction 65%. Patient was cleared for inpatient rehabilitation services 03/11/2018 . On the afternoon of 03/12/2018 she became more confused garbled speech aphasia. Neurology service is consulted stat cranial CT scan showed evolving subacute infarction without evidence of new intracranial abnormality. She was discharged to acute care services 03/12/2018 for ongoing evaluation. CT angiogram of head and neck showed high-grade stenosis versus nonocclusive thrombus involving the proximal left superior M2 division. Interventional radiology consulted underwent revascularization. Further discussion with hematology oncology in regards to left MCA infarction likely related to hypercoagulable state and she was started on Eliquis in place of aspirin. 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 She again was readmitted to inpatient rehabilitation services to 03/15/2018 for ongoing therapies.   Hospital Course: Nancy Norris was admitted to rehab 03/15/2018 for inpatient therapies to consist of  PT, ST and OT at least three hours five days a week. Past admission physiatrist, therapy team and rehab RN have worked together to provide customized collaborative inpatient rehab. 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 As noted Eliquis had been initiated. Patient was steady decline during her inpatient rehabilitation stay. Mood stabilization with Wellbutrin and Celexa. Her appetite was limited. Noted acute on chronic anemia she had required transfusion latest of 2 units 03/25/2018.Her eliquis was held secondary to bleeding. In regards to patient's poorly differentiated adenocarcinoma the stomach fall per oncology services patient was not a surgical candidate. Palliative care was consulted for goals of care. She did have a history of CK D stage IV creatinine stabilizing at 2.49. therapies were decreased to daily due to limited tolerance. Ongoing conversations held with daughter in regards to patient's plan of care it was felt that [facility] place was needed and discharge taking place.   Rehab course: During  patient's stay in rehab weekly team conferences were held to monitor patient's progress, set goals and discuss barriers to discharge. At admission, patient required minimal assist 10 feet rolling walker minimal assist sit to stand,moderate to max assist for ADLs. Noted speech impairments.  She  has had improvement in activity tolerance, balance, postural control as well as ability to compensate for deficits. He/She has had improvement in functional use RUE/LUE  and RLE/LLE as well as improvement in awareness.patient was missing multiple sessions due to fatigue palliative care consult limited participation due to activity tolerance.       Disposition:  discharge to [place]: regular consistency  Special Instructions: As dictated per palliative  care  Medications at time of discharge  1. Tylenol as needed 2. Wellbutrin 75 mg by mouth twice a day 3. Celexa 40 mg by mouth daily 4. Protonix 40 mg by mouth daily 5. Topamax 25 mg by mouth daily at bedtime  Follow-up Information    Kirsteins, Luanna Salk, MD Follow up.   Specialty:  Physical Medicine and Rehabilitation Why:  office to call for appointment Contact information: [place] [address] [phone]        Curt Bears, MD Follow up.   Specialty:  Oncology Why:  call for appointment Contact information: [place] [address] [phone]        Josue Hector, MD Follow up.   Specialty:  Cardiology Why:  call for appointment as needed Contact information: [address] [address] [phone]        Wonda Horner, MD Follow up.   Specialty:  Gastroenterology Why:  call for appointment Contact information: [address] [place] [address] [phone]        Luanne Bras, MD Follow up.   Specialties:  Interventional Radiology, Radiology Why:  call for appointment Contact information: [place] [address] [phone]           Signed: Cathlyn Parsons 03/26/2018, 9:40 AM

## 2018-03-26 NOTE — Progress Notes (Signed)
Speech Language Pathology Note  Patient Details  Name: Nancy Norris MRN: [id number] Date of Birth: Sep 20, 1940 Today's Date: 03/26/2018  Pt is discharging from skilled ST services as pt has experienced a medical decline and pt/family have decided to pursue Hospice care for comfort care. ST goals are no longer applicable as pt is not appropriate for ST treatment and will not be completing the CIR program.   Reasons for discharge:change in medical status and discharge from hospital to [place] 03/26/2018, 4:16 PM

## 2018-03-26 NOTE — Progress Notes (Addendum)
Social Work Patient ID: Nancy Norris, female   DOB: [birth year], 78 y.o.   MRN: [id number] Spoke with [facility] to make a referral for pt and intent of getting her transferred to [facility]. Have left message for daughter-[name] regarding who will be contacting her. Will work on transfer to [employer]

## 2018-03-26 NOTE — Progress Notes (Addendum)
[facility] PHYSICAL MEDICINE & REHABILITATION PROGRESS NOTE   Subjective/Complaints: Appreciate palliative and Oncology notes.  Pt does not remember me, not oriented to [facility] or reason for admit.   ROS: Limited due to cognitive/behavioral   Objective:   No results found. Recent Labs    03/24/18 0417 03/25/18 0401  WBC 5.5 5.8  HGB 7.6* 10.9*  HCT 24.2* 33.3*  PLT 55* 48*   Recent Labs    03/24/18 0417  NA 138  K 3.7  CL 112*  CO2 18*  GLUCOSE 102*  BUN 24*  CREATININE 2.36*  CALCIUM 8.2*    Intake/Output Summary (Last 24 hours) at 03/26/2018 0820 Last data filed at 03/25/2018 1430 Gross per 24 hour  Intake 50 ml  Output -  Net 50 ml     Physical Exam: Vital Signs Blood pressure (!) 145/75, pulse 72, temperature 98.7 F (37.1 C), temperature source Oral, resp. rate 18, height '5\' 5"'$  (1.651 m), weight 75 kg, SpO2 100 %.   Constitutional: No distress . Vital signs reviewed. Fatigued appearing HEENT: EOMI, oral membranes moist Neck: supple Cardiovascular: RRR without murmur. No JVD    Respiratory: CTA Bilaterally without wheezes or rales. Normal effort    GI: BS +, non-tender, non-distended  Musc: No edema or tenderness in extremities. Skin: No evidence of breakdown, no evidence of rash Neurologic: Alert, word finding deficits Motor:3+ to 4-/5 RUE/RLE  4/5 LUE/LLE.  Psych: pleasant, flat  Assessment/Plan: 1. Functional deficits secondary to Left MCA infarct which require 3+ hours per day of interdisciplinary therapy in a comprehensive inpatient rehab setting.  Physiatrist is providing close team supervision and 24 hour management of active medical problems listed below.  Physiatrist and rehab team continue to assess barriers to discharge/monitor patient progress toward functional and medical goals  Care Tool:  Bathing    Body parts bathed by patient: Right arm, Left arm, Chest, Abdomen, Front perineal area, Buttocks, Right upper leg, Left upper leg,  Face, Right lower leg, Left lower leg         Bathing assist Assist Level: Minimal Assistance - Patient > 75%(for standing balance)     Upper Body Dressing/Undressing Upper body dressing   What is the patient wearing?: Pull over shirt    Upper body assist Assist Level: Minimal Assistance - Patient > 75%    Lower Body Dressing/Undressing Lower body dressing      What is the patient wearing?: Underwear/pull up, Pants     Lower body assist Assist for lower body dressing: Minimal Assistance - Patient > 75%     Toileting Toileting Toileting Activity did not occur (Clothing management and hygiene only): N/A (no void or bm)  Toileting assist Assist for toileting: Minimal Assistance - Patient > 75% Assistive Device Comment: (Front wheel walker)   Transfers Chair/bed transfer  Transfers assist     Chair/bed transfer assist level: 2 Helpers     Locomotion Ambulation   Ambulation assist      Assist level: Minimal Assistance - Patient > 75% Assistive device: Walker-rolling Max distance: 180   Walk 10 feet activity   Assist  Walk 10 feet activity did not occur: Refused(due to pain)  Assist level: Minimal Assistance - Patient > 75% Assistive device: Walker-rolling   Walk 50 feet activity   Assist Walk 50 feet with 2 turns activity did not occur: Refused(due to pain)  Assist level: Minimal Assistance - Patient > 75% Assistive device: Walker-rolling    Walk 150 feet activity  Assist Walk 150 feet activity did not occur: Refused(due to pain)  Assist level: Minimal Assistance - Patient > 75% Assistive device: Walker-rolling    Walk 10 feet on uneven surface  activity   Assist Walk 10 feet on uneven surfaces activity did not occur: Refused         Wheelchair     Assist Will patient use wheelchair at discharge?: No      Wheelchair assist level: Dependent - Patient 0%      Wheelchair 50 feet with 2 turns activity    Assist     Wheelchair 50 feet with 2 turns activity did not occur: N/A       Wheelchair 150 feet activity     Assist Wheelchair 150 feet activity did not occur: N/A        Medical Problem List and Plan: 1.Confusion/altered mental status with gait disorder and speech difficultysecondary to left 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 Formulized individual treatment plan based on medical history, underlying problem and comorbidities.  Has bed at [facility] place 2. Antithrombotics: -DVT/anticoagulation:SCDs.+ DOAC -antiplatelet therapy: ELIQUIS 3. Pain Management:Tylenol as needed  Epigastric pain likely due to gastric adeno carcinoma relieved with GI cocktail  -headache improved with topamax  -continue tramadol for more severe pain 4. Mood:Wellbutrin 75 mg twice a day, Celexa 40 mg daily. Provide emotional support -antipsychotic agents: None  States she took something at home for anxiety, already on Wellbutrin and Celexa  5. Neuropsych: This patientiscapable of making decisions on herown behalf. 6. Skin/Wound Care:Routine skin checks 7. Fluids/Electrolytes/Nutrition:Monitor I/Os  Encourage PO intake as possible - very poor BUN increasing  8. Poorly differentiated adenocarcinoma the stomach as well as history of multiple myeloma and remote breast cancer. Patient  to follow up outpatient with oncology services Dr. Earlie Server 9. COPD with remote tobacco use. Continue nebulizer treatments as directed 10. Chronic anemia/thrombocytopenia. Patient follow-up outpatient with hematology oncology. Transfuse as needed  Platelets  58 on 3/8---> 55 3/9--->48 today 3/10  Hgb 7.6 before blood transfusion 2u prbc. 10.9  3/10 11. Orthostatic hypotension. ProAmatine 10 mg daily  Continue to monitor, continue IVF for now 12. CKD stage IV. Creatinine baseline 2.29-2.93.   Creatinine 2.49 on 3/8---->2.39 3/9 13. Hypothyroidism. Synthroid 14. GERD. Protonix 15. Hyperlipidemia. Lipitor  16.  Constipation:  Meds increased on 3/8 17.  HypoK:  KCL 71mq BID ordered on 3/8  Potassium 3.1 on 3/8, back to 3.7 3/9   L[place]: [facility] [place] [facility] [place] Zariyah Stephens 03/26/2018, 8:20 AM

## 2018-03-26 NOTE — Progress Notes (Signed)
Physical Therapy Discharge Summary  Patient Details  Name: RAYMOND AZURE MRN: [id number] Date of Birth: 1940-08-21  Today's Date: 03/26/2018  Pt is discharging from skilled PT services as pt has experienced a medical decline and pt/family are seeking hospice comfort care. PT goals are no longer applicable as pt is not appropriate for PT treatment and not completing the CIR program.   Reasons for discharge: change in medical status and discharge from hospital  Patient/family agrees with progress made and goals achieved: Yes     [name] 03/26/2018, 4:10 PM

## 2018-03-27 ENCOUNTER — Ambulatory Visit: Payer: Medicare Other

## 2018-04-02 ENCOUNTER — Inpatient Hospital Stay: Payer: Medicare Other

## 2018-04-03 ENCOUNTER — Ambulatory Visit: Payer: Medicare Other

## 2018-04-16 DEATH — deceased

## 2018-04-17 ENCOUNTER — Encounter: Payer: Self-pay | Admitting: Internal Medicine

## 2018-05-30 ENCOUNTER — Other Ambulatory Visit: Payer: Self-pay

## 2018-06-24 LAB — BLOOD GAS, VENOUS
ACID-BASE DEFICIT: 2.9 mmol/L — AB (ref 0.0–2.0)
BICARBONATE: 22 mmol/L (ref 20.0–28.0)
O2 Saturation: 26.1 %
PCO2 VEN: 41.7 mmHg — AB (ref 44.0–60.0)
Patient temperature: 98.6
pH, Ven: 7.342 (ref 7.250–7.430)

## 2020-05-03 IMAGING — CR DG ABDOMEN ACUTE W/ 1V CHEST
4 series · 4 of 4 positions shown · non-contrast
Comparison: None.

CLINICAL DATA: Lapse in memory.

EXAM:
DG ABDOMEN ACUTE W/ 1V CHEST

[w chest pa]
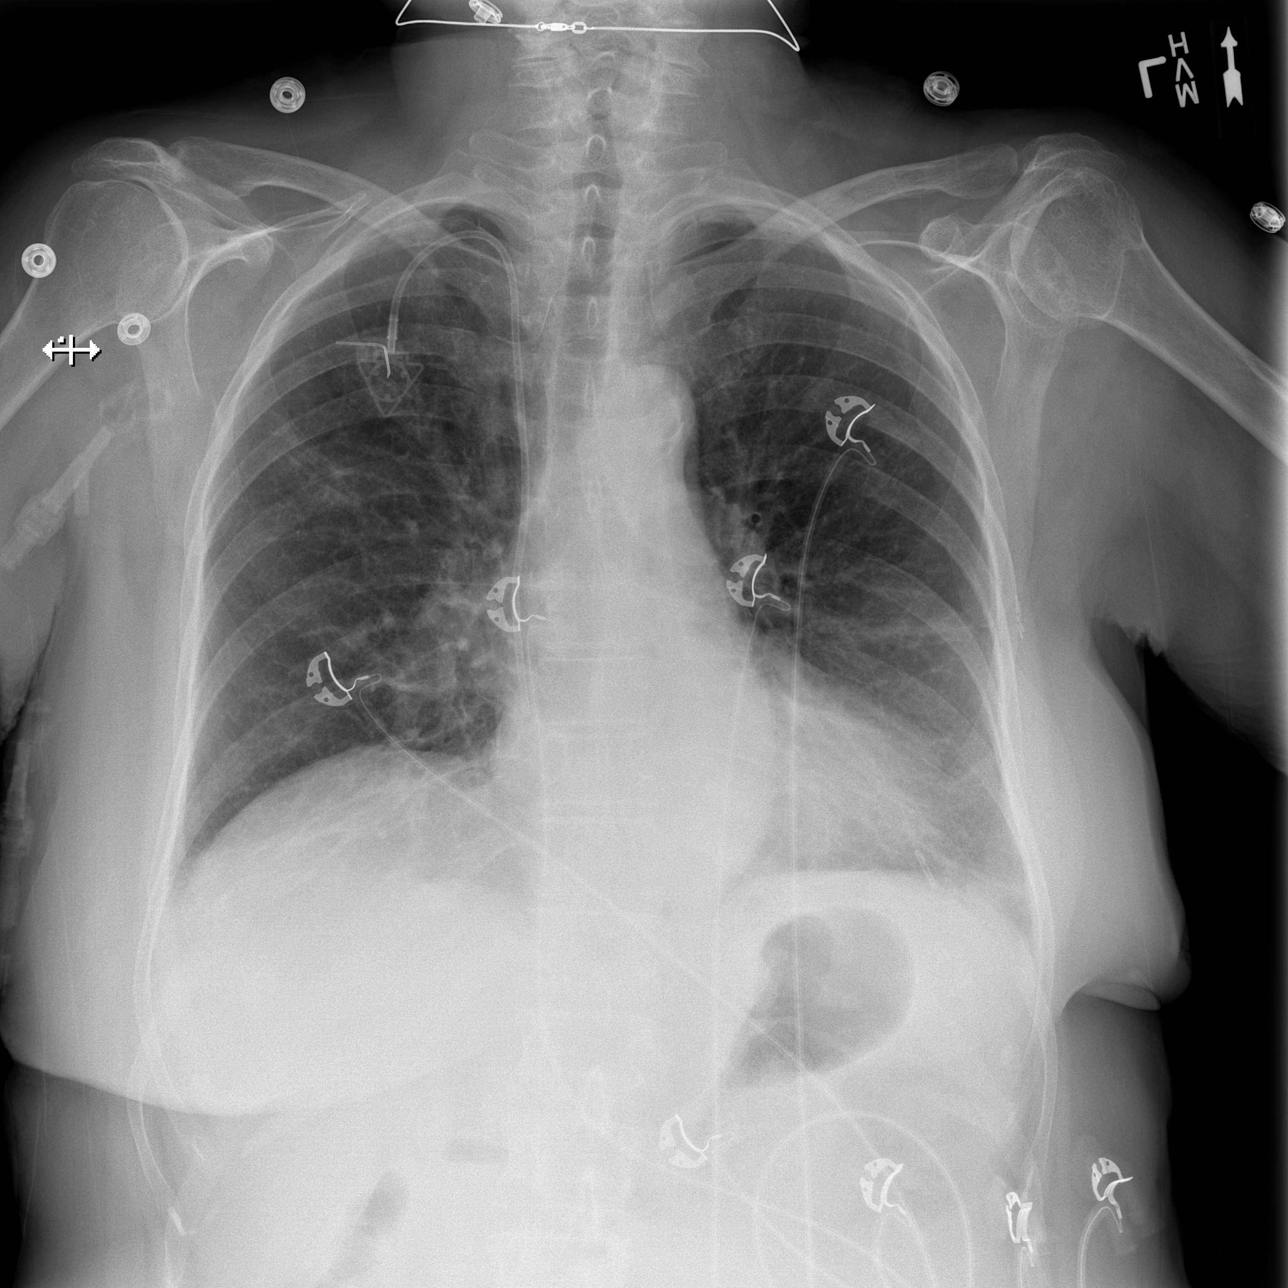

[w abdomen upright]
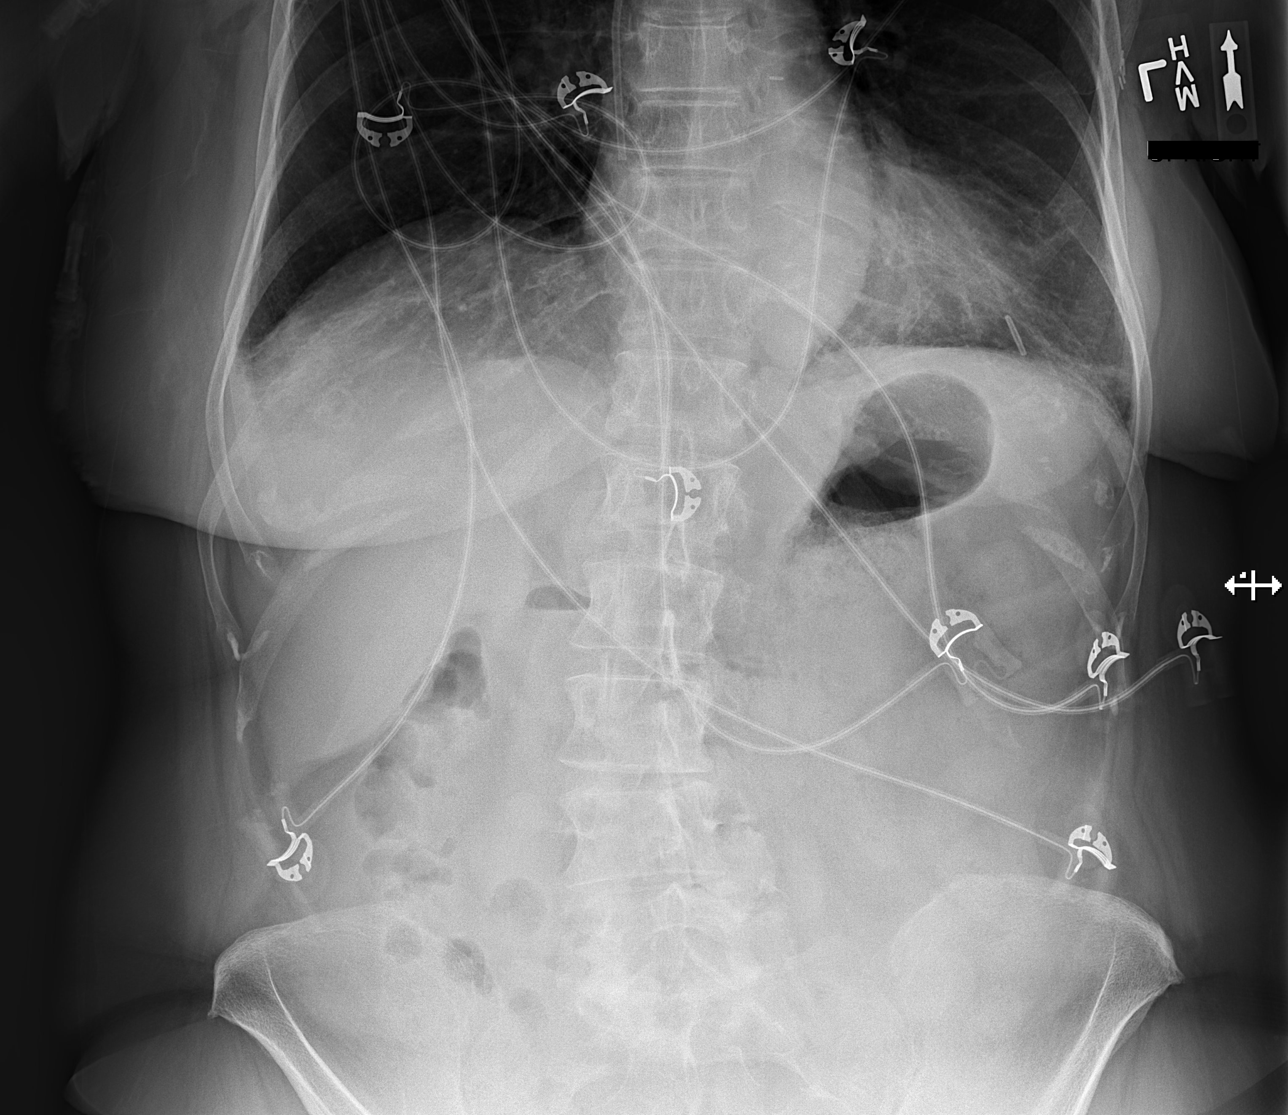

[t abdomen supine (1 of 2)]
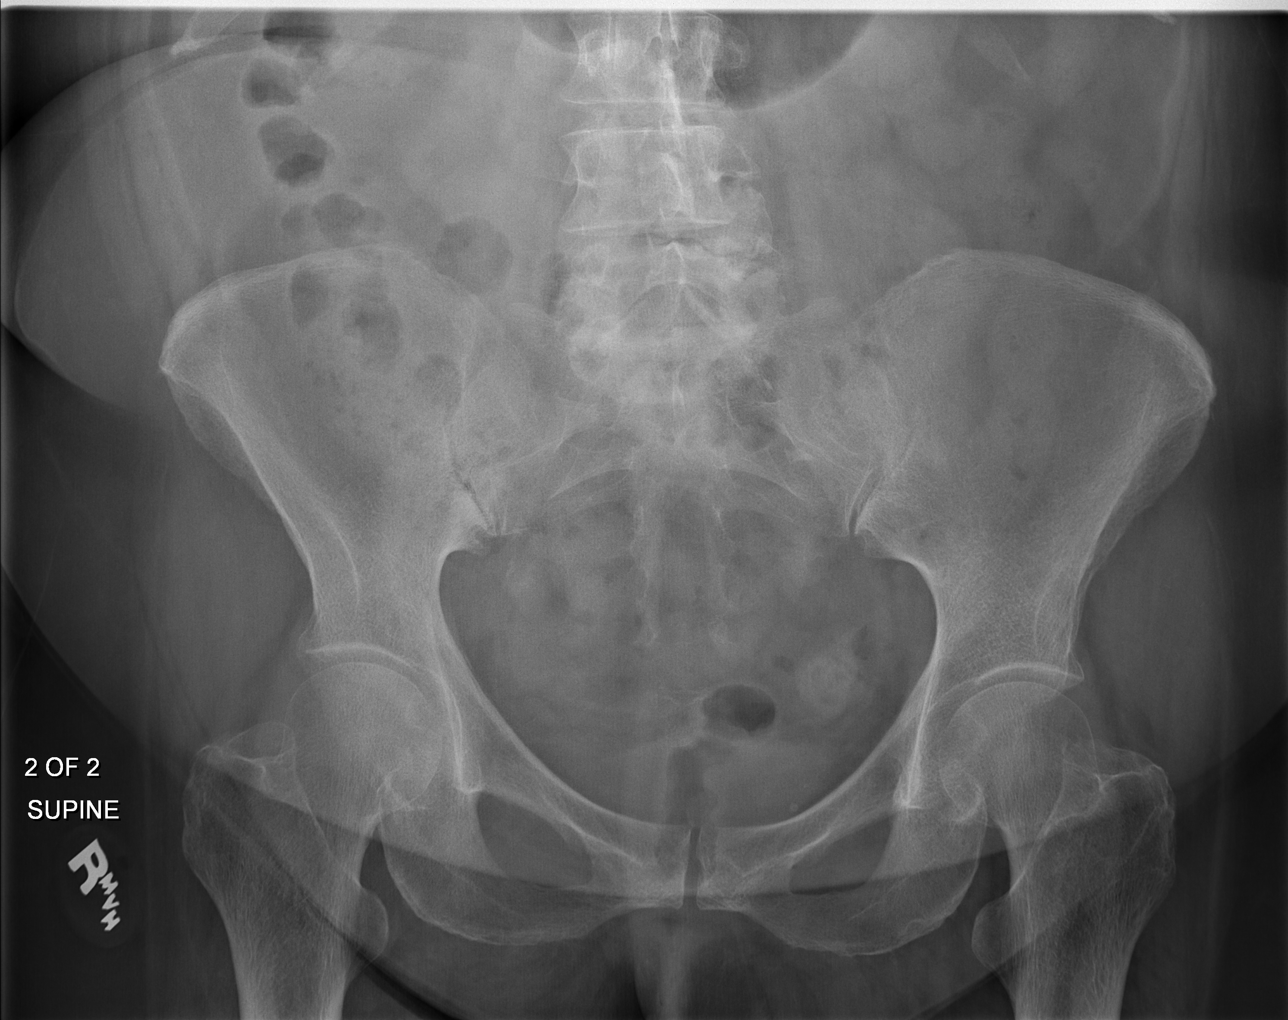

[t abdomen supine (2 of 2)]
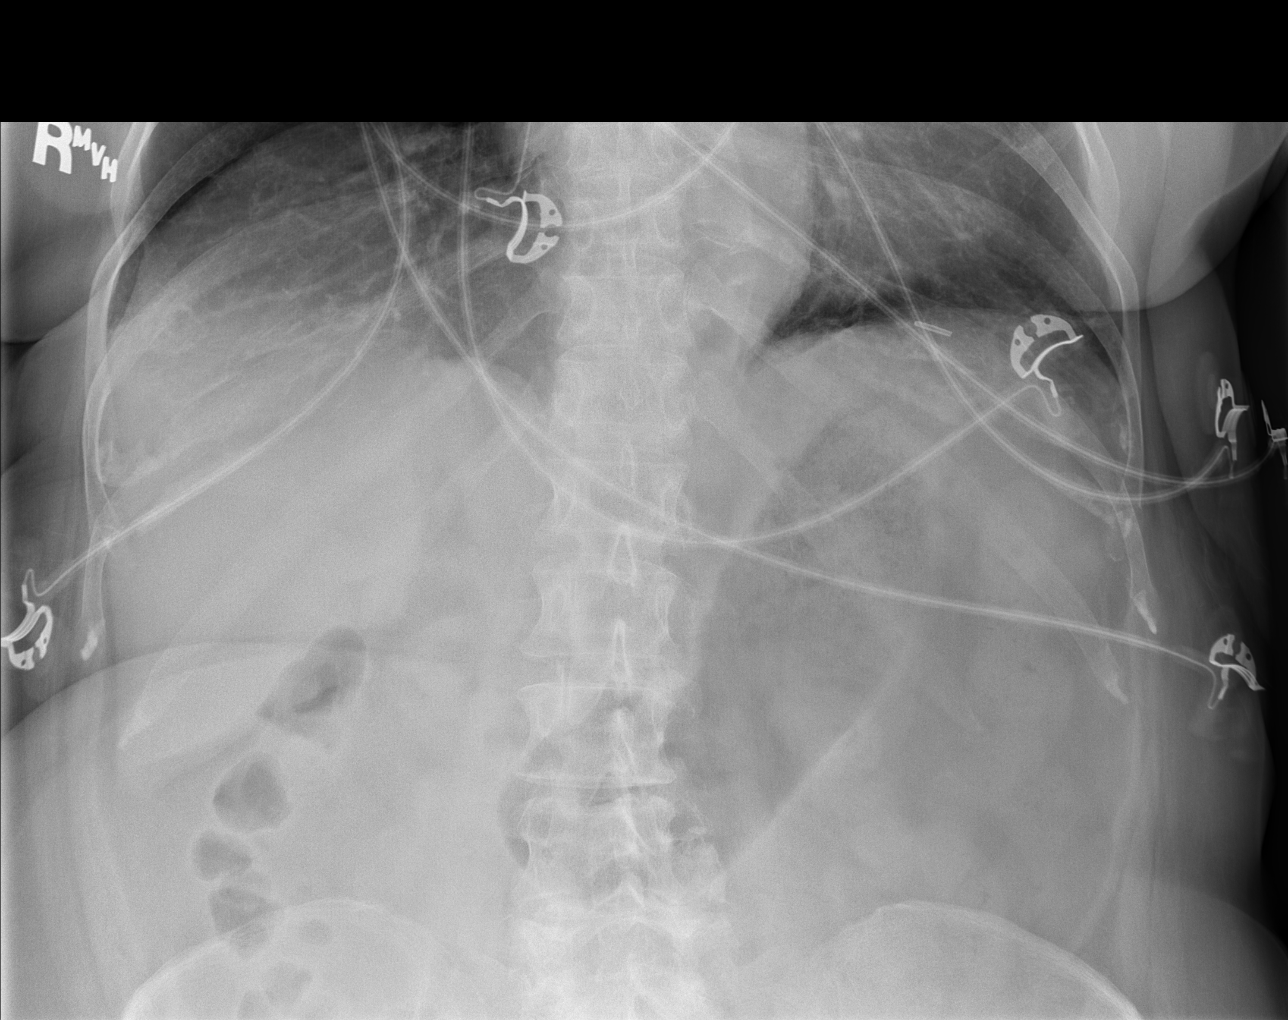

[4 of 4 positions shown; findings below may reference images not displayed]

FINDINGS: There is no evidence of dilated bowel loops or free intraperitoneal
air. No radiopaque calculi or other significant radiographic
abnormality is seen. Heart size and mediastinal contours are within
normal limits. Both lungs are clear. Calcified tortuous aorta.
Degenerative RIGHT SI joint. Port-A-Cath, RIGHT chest subclavian
approach, tip RIGHT atrium.
IMPRESSION: Negative abdominal radiographs. No acute cardiopulmonary disease.

## 2020-05-08 IMAGING — CR DG CHEST 2V
3 series · 3 of 3 positions shown · non-contrast
Comparison: 02/26/2018 radiography.  CT 01/20/2018.

CLINICAL DATA: Weakness, dizziness and confusion over the last 2
weeks. History of renal cell cancer and myeloma.

EXAM:
CHEST - 2 VIEW

[w chest lat (1 of 2)]
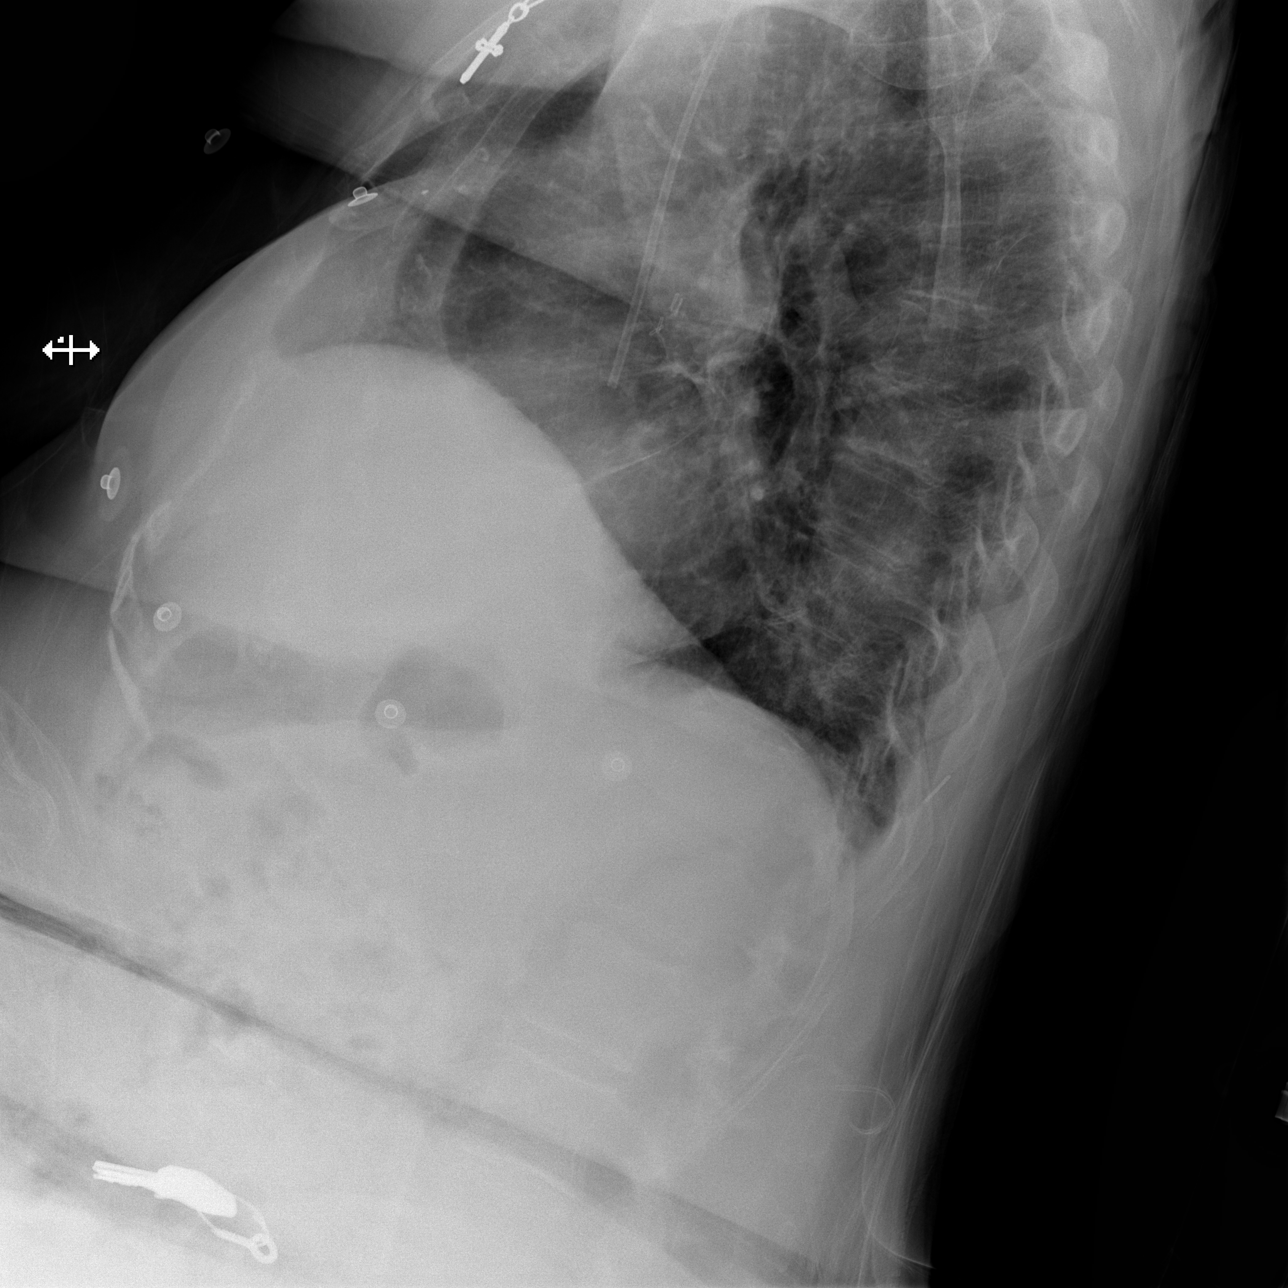

[w chest lat (2 of 2)]
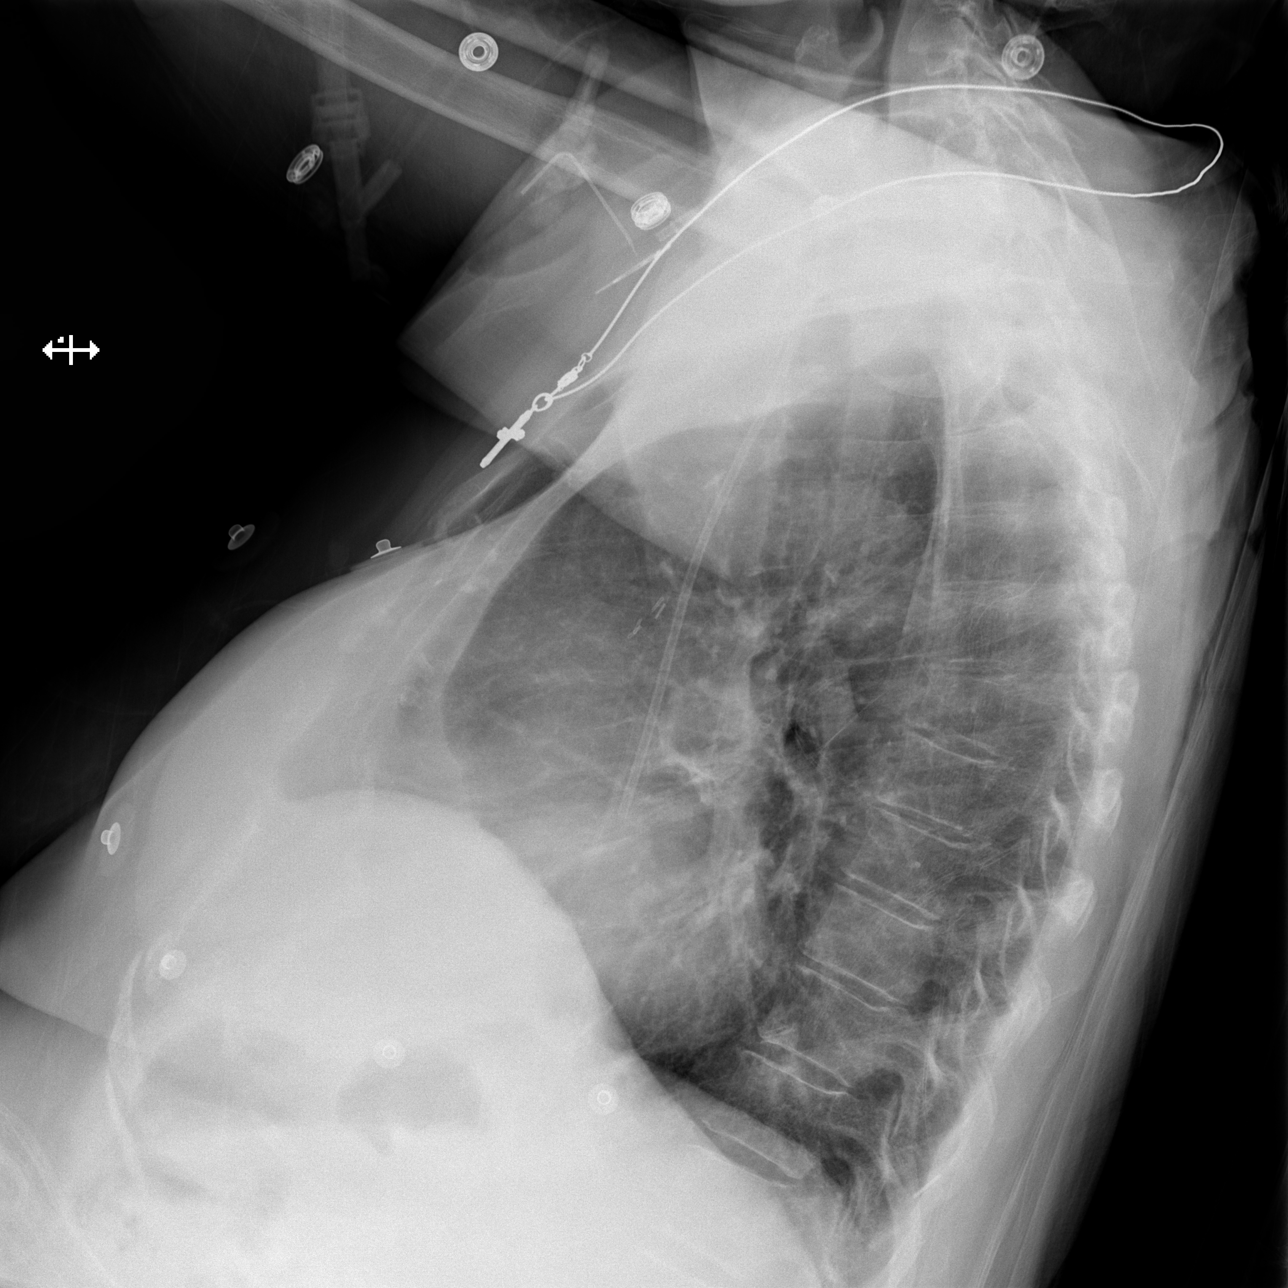

[x chest ap]
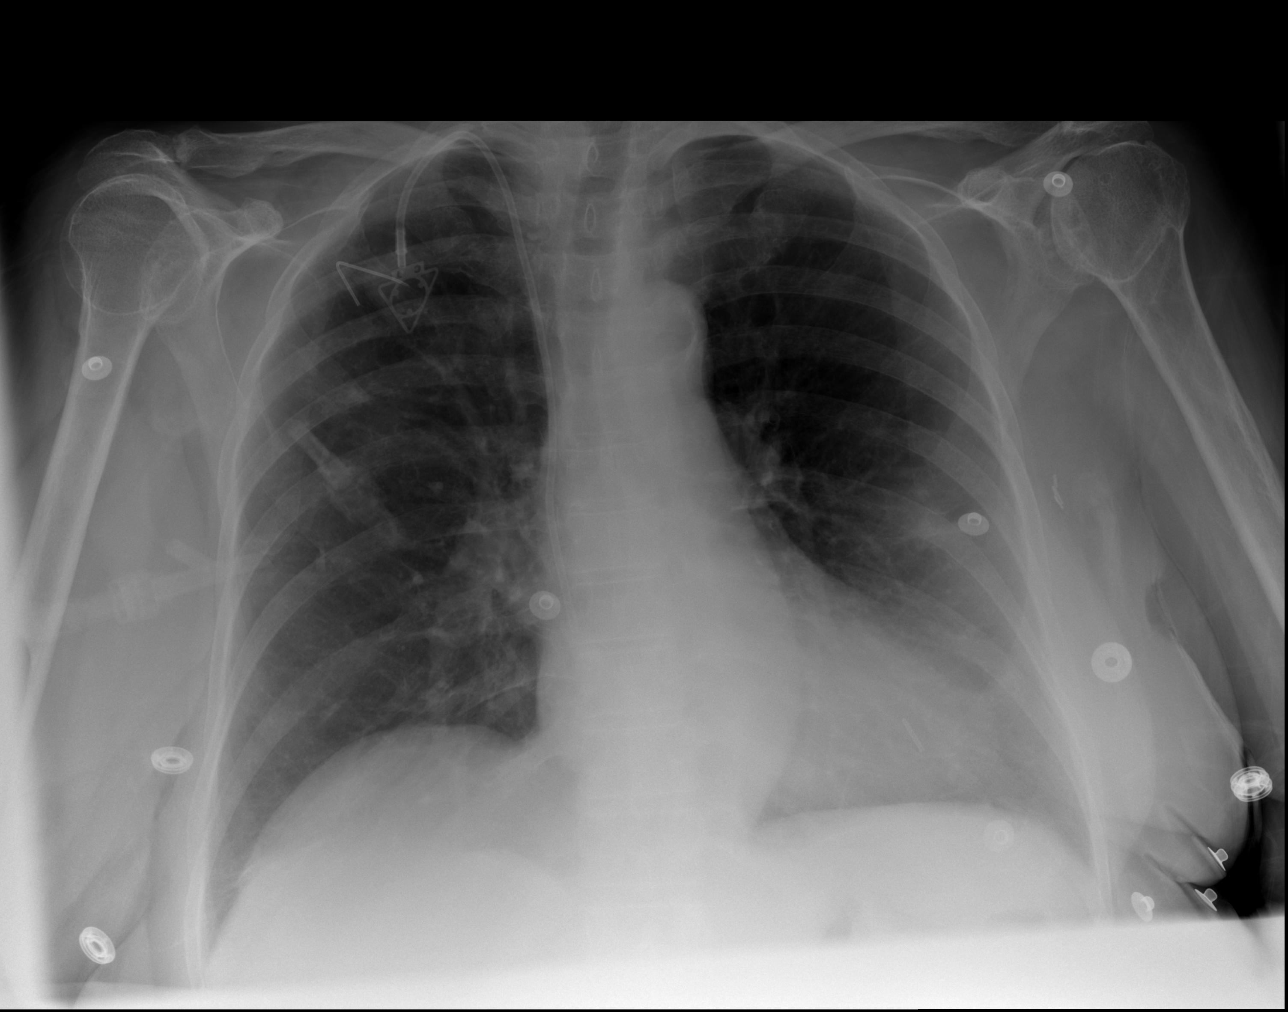

[3 of 3 positions shown; findings below may reference images not displayed]

FINDINGS: Heart size is normal. Chronic aortic atherosclerosis. Power port
inserted from a right jugular approach has its tip at the SVC RA
junction. The lungs are clear except for mild scarring. No sign of
active infiltrate, mass, effusion or collapse.
IMPRESSION: No active cardiopulmonary disease.

## 2021-01-15 DEATH — deceased
